# Patient Record
Sex: Female | Born: 1940 | Race: White | Hispanic: No | Marital: Married | Smoking: Former smoker
Health system: Southern US, Community
[De-identification: ages and names within clinical notes are randomized; demographics above are authoritative.]

## PROBLEM LIST (undated history)

## (undated) DIAGNOSIS — Z9289 Personal history of other medical treatment: Secondary | ICD-10-CM

## (undated) DIAGNOSIS — M1990 Unspecified osteoarthritis, unspecified site: Secondary | ICD-10-CM

## (undated) DIAGNOSIS — I82409 Acute embolism and thrombosis of unspecified deep veins of unspecified lower extremity: Secondary | ICD-10-CM

## (undated) DIAGNOSIS — J189 Pneumonia, unspecified organism: Secondary | ICD-10-CM

## (undated) DIAGNOSIS — I509 Heart failure, unspecified: Secondary | ICD-10-CM

## (undated) DIAGNOSIS — Z87442 Personal history of urinary calculi: Secondary | ICD-10-CM

## (undated) DIAGNOSIS — I10 Essential (primary) hypertension: Secondary | ICD-10-CM

## (undated) DIAGNOSIS — G8929 Other chronic pain: Secondary | ICD-10-CM

## (undated) DIAGNOSIS — I4891 Unspecified atrial fibrillation: Secondary | ICD-10-CM

## (undated) DIAGNOSIS — K5900 Constipation, unspecified: Secondary | ICD-10-CM

## (undated) DIAGNOSIS — G4733 Obstructive sleep apnea (adult) (pediatric): Secondary | ICD-10-CM

## (undated) DIAGNOSIS — M545 Low back pain, unspecified: Secondary | ICD-10-CM

## (undated) DIAGNOSIS — R600 Localized edema: Secondary | ICD-10-CM

## (undated) DIAGNOSIS — I4819 Other persistent atrial fibrillation: Secondary | ICD-10-CM

## (undated) DIAGNOSIS — E119 Type 2 diabetes mellitus without complications: Secondary | ICD-10-CM

## (undated) DIAGNOSIS — M2550 Pain in unspecified joint: Secondary | ICD-10-CM

## (undated) DIAGNOSIS — J449 Chronic obstructive pulmonary disease, unspecified: Secondary | ICD-10-CM

## (undated) DIAGNOSIS — E785 Hyperlipidemia, unspecified: Secondary | ICD-10-CM

## (undated) DIAGNOSIS — R0600 Dyspnea, unspecified: Secondary | ICD-10-CM

## (undated) DIAGNOSIS — Z9989 Dependence on other enabling machines and devices: Secondary | ICD-10-CM

## (undated) HISTORY — DX: Hyperlipidemia, unspecified: E78.5

## (undated) HISTORY — DX: Localized edema: R60.0

## (undated) HISTORY — DX: Unspecified atrial fibrillation: I48.91

## (undated) HISTORY — PX: CATARACT EXTRACTION W/ INTRAOCULAR LENS  IMPLANT, BILATERAL: SHX1307

## (undated) HISTORY — PX: TUBAL LIGATION: SHX77

## (undated) HISTORY — DX: Pain in unspecified joint: M25.50

## (undated) HISTORY — DX: Essential (primary) hypertension: I10

## (undated) HISTORY — DX: Dyspnea, unspecified: R06.00

## (undated) HISTORY — PX: CARDIAC CATHETERIZATION: SHX172

## (undated) HISTORY — PX: GANGLION CYST EXCISION: SHX1691

## (undated) HISTORY — PX: COLONOSCOPY: SHX174

## (undated) HISTORY — DX: Chronic obstructive pulmonary disease, unspecified: J44.9

## (undated) HISTORY — PX: CYSTECTOMY: SUR359

## (undated) HISTORY — DX: Constipation, unspecified: K59.00

## (undated) HISTORY — PX: BACK SURGERY: SHX140

## (undated) HISTORY — PX: CARPAL TUNNEL RELEASE: SHX101

## (undated) HISTORY — PX: KNEE CARTILAGE SURGERY: SHX688

---

## 1988-07-17 DIAGNOSIS — Z9289 Personal history of other medical treatment: Secondary | ICD-10-CM

## 1988-07-17 HISTORY — PX: COLON SURGERY: SHX602

## 1988-07-17 HISTORY — PX: LUMBAR DISC SURGERY: SHX700

## 1988-07-17 HISTORY — DX: Personal history of other medical treatment: Z92.89

## 1988-07-17 HISTORY — PX: REPAIR ILIAC ARTERY: SHX6216

## 2001-11-19 ENCOUNTER — Other Ambulatory Visit: Admission: RE | Admit: 2001-11-19 | Discharge: 2001-11-19 | Payer: Self-pay | Admitting: Family Medicine

## 2001-11-20 LAB — HM COLONOSCOPY

## 2003-01-07 ENCOUNTER — Encounter: Admission: RE | Admit: 2003-01-07 | Discharge: 2003-04-07 | Payer: Self-pay | Admitting: Family Medicine

## 2003-05-13 ENCOUNTER — Other Ambulatory Visit: Admission: RE | Admit: 2003-05-13 | Discharge: 2003-05-13 | Payer: Self-pay | Admitting: Obstetrics and Gynecology

## 2004-07-14 ENCOUNTER — Ambulatory Visit: Payer: Self-pay | Admitting: Family Medicine

## 2004-07-15 ENCOUNTER — Ambulatory Visit: Payer: Self-pay | Admitting: Family Medicine

## 2004-07-26 ENCOUNTER — Ambulatory Visit: Payer: Self-pay | Admitting: Family Medicine

## 2004-08-29 ENCOUNTER — Ambulatory Visit: Payer: Self-pay | Admitting: Family Medicine

## 2004-11-03 ENCOUNTER — Ambulatory Visit: Payer: Self-pay | Admitting: Family Medicine

## 2004-12-30 ENCOUNTER — Ambulatory Visit: Payer: Self-pay | Admitting: Family Medicine

## 2005-02-14 ENCOUNTER — Ambulatory Visit: Payer: Self-pay | Admitting: Family Medicine

## 2005-03-27 ENCOUNTER — Ambulatory Visit: Payer: Self-pay | Admitting: Family Medicine

## 2005-05-19 ENCOUNTER — Ambulatory Visit: Payer: Self-pay | Admitting: Family Medicine

## 2005-06-02 ENCOUNTER — Ambulatory Visit (HOSPITAL_COMMUNITY): Admission: RE | Admit: 2005-06-02 | Discharge: 2005-06-02 | Payer: Self-pay | Admitting: Orthopedic Surgery

## 2005-06-02 ENCOUNTER — Ambulatory Visit (HOSPITAL_BASED_OUTPATIENT_CLINIC_OR_DEPARTMENT_OTHER): Admission: RE | Admit: 2005-06-02 | Discharge: 2005-06-02 | Payer: Self-pay | Admitting: Orthopedic Surgery

## 2005-09-04 ENCOUNTER — Ambulatory Visit: Payer: Self-pay | Admitting: Family Medicine

## 2005-11-02 ENCOUNTER — Encounter: Payer: Self-pay | Admitting: Vascular Surgery

## 2005-11-02 ENCOUNTER — Ambulatory Visit (HOSPITAL_COMMUNITY): Admission: RE | Admit: 2005-11-02 | Discharge: 2005-11-02 | Payer: Self-pay | Admitting: Family Medicine

## 2005-11-12 ENCOUNTER — Ambulatory Visit: Payer: Self-pay | Admitting: *Deleted

## 2005-11-12 ENCOUNTER — Ambulatory Visit: Payer: Self-pay | Admitting: Emergency Medicine

## 2005-11-12 ENCOUNTER — Inpatient Hospital Stay (HOSPITAL_COMMUNITY): Admission: EM | Admit: 2005-11-12 | Discharge: 2005-11-17 | Payer: Self-pay | Admitting: Emergency Medicine

## 2005-11-16 ENCOUNTER — Encounter: Payer: Self-pay | Admitting: Cardiology

## 2005-11-24 ENCOUNTER — Ambulatory Visit: Payer: Self-pay | Admitting: Pulmonary Disease

## 2005-11-28 ENCOUNTER — Ambulatory Visit: Payer: Self-pay | Admitting: Family Medicine

## 2005-12-08 ENCOUNTER — Ambulatory Visit: Payer: Self-pay | Admitting: Cardiology

## 2005-12-12 ENCOUNTER — Ambulatory Visit: Payer: Self-pay | Admitting: Emergency Medicine

## 2005-12-18 ENCOUNTER — Ambulatory Visit: Payer: Self-pay | Admitting: Family Medicine

## 2006-01-09 ENCOUNTER — Ambulatory Visit (HOSPITAL_BASED_OUTPATIENT_CLINIC_OR_DEPARTMENT_OTHER): Admission: RE | Admit: 2006-01-09 | Discharge: 2006-01-09 | Payer: Self-pay | Admitting: Emergency Medicine

## 2006-01-18 ENCOUNTER — Ambulatory Visit: Payer: Self-pay | Admitting: Emergency Medicine

## 2006-02-02 ENCOUNTER — Ambulatory Visit: Payer: Self-pay | Admitting: Emergency Medicine

## 2006-02-05 ENCOUNTER — Ambulatory Visit: Payer: Self-pay | Admitting: Cardiology

## 2006-02-09 ENCOUNTER — Ambulatory Visit: Payer: Self-pay | Admitting: Pulmonary Disease

## 2006-04-17 ENCOUNTER — Ambulatory Visit: Payer: Self-pay | Admitting: Family Medicine

## 2006-05-21 ENCOUNTER — Ambulatory Visit: Payer: Self-pay | Admitting: Family Medicine

## 2006-07-12 ENCOUNTER — Ambulatory Visit: Payer: Self-pay | Admitting: Critical Care Medicine

## 2006-08-09 ENCOUNTER — Ambulatory Visit: Payer: Self-pay | Admitting: Emergency Medicine

## 2006-08-09 LAB — CONVERTED CEMR LAB
Chloride: 99 meq/L (ref 96–112)
GFR calc non Af Amer: 90 mL/min
Sodium: 141 meq/L (ref 135–145)

## 2006-08-14 ENCOUNTER — Ambulatory Visit: Payer: Self-pay | Admitting: Cardiology

## 2006-10-02 ENCOUNTER — Ambulatory Visit: Payer: Self-pay | Admitting: Family Medicine

## 2006-10-15 ENCOUNTER — Ambulatory Visit: Payer: Self-pay | Admitting: Family Medicine

## 2006-10-15 LAB — CONVERTED CEMR LAB
Alkaline Phosphatase: 81 units/L (ref 39–117)
BUN: 13 mg/dL (ref 6–23)
Bilirubin, Direct: 0.1 mg/dL (ref 0.0–0.3)
Glucose, Bld: 224 mg/dL — ABNORMAL HIGH (ref 70–99)
Hgb A1c MFr Bld: 9.3 % — ABNORMAL HIGH (ref 4.6–6.0)
Sodium: 139 meq/L (ref 135–145)
Total Bilirubin: 0.7 mg/dL (ref 0.3–1.2)
Total Protein: 6.7 g/dL (ref 6.0–8.3)

## 2006-10-16 ENCOUNTER — Encounter: Payer: Self-pay | Admitting: Family Medicine

## 2006-12-06 DIAGNOSIS — I10 Essential (primary) hypertension: Secondary | ICD-10-CM | POA: Insufficient documentation

## 2006-12-06 DIAGNOSIS — J449 Chronic obstructive pulmonary disease, unspecified: Secondary | ICD-10-CM | POA: Insufficient documentation

## 2006-12-06 DIAGNOSIS — G4733 Obstructive sleep apnea (adult) (pediatric): Secondary | ICD-10-CM | POA: Insufficient documentation

## 2007-03-20 ENCOUNTER — Telehealth: Payer: Self-pay | Admitting: Family Medicine

## 2007-03-20 DIAGNOSIS — E1159 Type 2 diabetes mellitus with other circulatory complications: Secondary | ICD-10-CM | POA: Insufficient documentation

## 2007-04-11 ENCOUNTER — Ambulatory Visit: Payer: Self-pay | Admitting: Family Medicine

## 2007-04-19 ENCOUNTER — Telehealth (INDEPENDENT_AMBULATORY_CARE_PROVIDER_SITE_OTHER): Payer: Self-pay | Admitting: *Deleted

## 2007-04-19 LAB — CONVERTED CEMR LAB
ALT: 44 units/L — ABNORMAL HIGH (ref 0–35)
Albumin: 3.6 g/dL (ref 3.5–5.2)
BUN: 17 mg/dL (ref 6–23)
CO2: 31 meq/L (ref 19–32)
Creatinine, Ser: 0.8 mg/dL (ref 0.4–1.2)
Creatinine,U: 183.8 mg/dL
GFR calc Af Amer: 93 mL/min
GFR calc non Af Amer: 77 mL/min
Glucose, Bld: 202 mg/dL — ABNORMAL HIGH (ref 70–99)
Total CHOL/HDL Ratio: 4
Total Protein: 7.5 g/dL (ref 6.0–8.3)
Triglycerides: 223 mg/dL (ref 0–149)
VLDL: 45 mg/dL — ABNORMAL HIGH (ref 0–40)

## 2007-04-29 ENCOUNTER — Telehealth (INDEPENDENT_AMBULATORY_CARE_PROVIDER_SITE_OTHER): Payer: Self-pay | Admitting: *Deleted

## 2007-05-14 DIAGNOSIS — J984 Other disorders of lung: Secondary | ICD-10-CM | POA: Insufficient documentation

## 2007-05-14 DIAGNOSIS — I739 Peripheral vascular disease, unspecified: Secondary | ICD-10-CM | POA: Insufficient documentation

## 2007-05-14 DIAGNOSIS — E669 Obesity, unspecified: Secondary | ICD-10-CM | POA: Insufficient documentation

## 2007-07-23 ENCOUNTER — Encounter: Payer: Self-pay | Admitting: Family Medicine

## 2007-09-12 ENCOUNTER — Telehealth (INDEPENDENT_AMBULATORY_CARE_PROVIDER_SITE_OTHER): Payer: Self-pay | Admitting: *Deleted

## 2007-10-02 ENCOUNTER — Telehealth (INDEPENDENT_AMBULATORY_CARE_PROVIDER_SITE_OTHER): Payer: Self-pay | Admitting: *Deleted

## 2007-10-03 ENCOUNTER — Telehealth (INDEPENDENT_AMBULATORY_CARE_PROVIDER_SITE_OTHER): Payer: Self-pay | Admitting: *Deleted

## 2007-10-08 ENCOUNTER — Telehealth (INDEPENDENT_AMBULATORY_CARE_PROVIDER_SITE_OTHER): Payer: Self-pay | Admitting: *Deleted

## 2007-10-09 ENCOUNTER — Telehealth (INDEPENDENT_AMBULATORY_CARE_PROVIDER_SITE_OTHER): Payer: Self-pay | Admitting: *Deleted

## 2007-10-22 ENCOUNTER — Telehealth (INDEPENDENT_AMBULATORY_CARE_PROVIDER_SITE_OTHER): Payer: Self-pay | Admitting: *Deleted

## 2007-10-29 ENCOUNTER — Ambulatory Visit: Payer: Self-pay | Admitting: Family Medicine

## 2007-11-04 ENCOUNTER — Telehealth (INDEPENDENT_AMBULATORY_CARE_PROVIDER_SITE_OTHER): Payer: Self-pay | Admitting: *Deleted

## 2007-11-07 ENCOUNTER — Telehealth (INDEPENDENT_AMBULATORY_CARE_PROVIDER_SITE_OTHER): Payer: Self-pay | Admitting: *Deleted

## 2007-11-07 ENCOUNTER — Encounter (INDEPENDENT_AMBULATORY_CARE_PROVIDER_SITE_OTHER): Payer: Self-pay | Admitting: *Deleted

## 2007-11-07 ENCOUNTER — Ambulatory Visit: Payer: Self-pay | Admitting: Family Medicine

## 2007-11-07 LAB — CONVERTED CEMR LAB
Alkaline Phosphatase: 58 units/L (ref 39–117)
BUN: 15 mg/dL (ref 6–23)
Basophils Absolute: 0 10*3/uL (ref 0.0–0.1)
Basophils Relative: 0 % (ref 0.0–1.0)
Bilirubin, Direct: 0.1 mg/dL (ref 0.0–0.3)
Eosinophils Absolute: 0.1 10*3/uL (ref 0.0–0.7)
Eosinophils Relative: 1.4 % (ref 0.0–5.0)
Hemoglobin: 13.4 g/dL (ref 12.0–15.0)
Hgb A1c MFr Bld: 7 % — ABNORMAL HIGH (ref 4.6–6.0)
MCV: 88.9 fL (ref 78.0–100.0)
Monocytes Absolute: 0.6 10*3/uL (ref 0.1–1.0)
Neutro Abs: 4.7 10*3/uL (ref 1.4–7.7)
Sodium: 141 meq/L (ref 135–145)
Total Bilirubin: 0.6 mg/dL (ref 0.3–1.2)
Total Protein: 6.9 g/dL (ref 6.0–8.3)
WBC: 7.6 10*3/uL (ref 4.5–10.5)

## 2007-12-04 ENCOUNTER — Encounter: Payer: Self-pay | Admitting: Family Medicine

## 2007-12-24 ENCOUNTER — Telehealth (INDEPENDENT_AMBULATORY_CARE_PROVIDER_SITE_OTHER): Payer: Self-pay | Admitting: *Deleted

## 2008-03-30 ENCOUNTER — Ambulatory Visit: Payer: Self-pay | Admitting: Family Medicine

## 2008-04-13 ENCOUNTER — Encounter (INDEPENDENT_AMBULATORY_CARE_PROVIDER_SITE_OTHER): Payer: Self-pay | Admitting: *Deleted

## 2008-04-13 LAB — CONVERTED CEMR LAB
ALT: 52 units/L — ABNORMAL HIGH (ref 0–35)
Albumin: 3.6 g/dL (ref 3.5–5.2)
Alkaline Phosphatase: 50 units/L (ref 39–117)
Direct LDL: 64.5 mg/dL
HDL: 21.3 mg/dL — ABNORMAL LOW (ref >39.0)
Total Bilirubin: 0.6 mg/dL (ref 0.3–1.2)
Total Protein: 7.4 g/dL (ref 6.0–8.3)
VLDL: 42 mg/dL — ABNORMAL HIGH (ref 0–40)

## 2008-04-14 ENCOUNTER — Telehealth (INDEPENDENT_AMBULATORY_CARE_PROVIDER_SITE_OTHER): Payer: Self-pay | Admitting: *Deleted

## 2008-07-30 ENCOUNTER — Ambulatory Visit: Payer: Self-pay | Admitting: Family Medicine

## 2008-07-30 DIAGNOSIS — J441 Chronic obstructive pulmonary disease with (acute) exacerbation: Secondary | ICD-10-CM | POA: Insufficient documentation

## 2008-08-03 ENCOUNTER — Telehealth: Payer: Self-pay | Admitting: Family Medicine

## 2008-08-06 ENCOUNTER — Telehealth: Payer: Self-pay | Admitting: Family Medicine

## 2008-10-26 ENCOUNTER — Telehealth (INDEPENDENT_AMBULATORY_CARE_PROVIDER_SITE_OTHER): Payer: Self-pay | Admitting: *Deleted

## 2008-11-20 ENCOUNTER — Telehealth (INDEPENDENT_AMBULATORY_CARE_PROVIDER_SITE_OTHER): Payer: Self-pay | Admitting: *Deleted

## 2008-12-28 ENCOUNTER — Ambulatory Visit: Payer: Self-pay | Admitting: Family Medicine

## 2009-01-06 LAB — CONVERTED CEMR LAB
AST: 47 units/L — ABNORMAL HIGH (ref 0–37)
CO2: 29 meq/L (ref 19–32)
Chloride: 109 meq/L (ref 96–112)
Cholesterol: 221 mg/dL — ABNORMAL HIGH (ref 0–200)
Creatinine, Ser: 0.7 mg/dL (ref 0.4–1.2)
GFR calc non Af Amer: 88.72 mL/min (ref >60)
Glucose, Bld: 158 mg/dL — ABNORMAL HIGH (ref 70–99)
Microalb Creat Ratio: 21.4 mg/g (ref 0.0–30.0)
Microalb, Ur: 2.7 mg/dL — ABNORMAL HIGH (ref 0.0–1.9)
Total Bilirubin: 0.6 mg/dL (ref 0.3–1.2)
Total Protein: 7.4 g/dL (ref 6.0–8.3)

## 2009-01-07 ENCOUNTER — Encounter (INDEPENDENT_AMBULATORY_CARE_PROVIDER_SITE_OTHER): Payer: Self-pay | Admitting: *Deleted

## 2009-01-11 ENCOUNTER — Ambulatory Visit: Payer: Self-pay | Admitting: Family Medicine

## 2009-01-25 ENCOUNTER — Encounter: Payer: Self-pay | Admitting: Nurse Practitioner

## 2009-01-27 ENCOUNTER — Ambulatory Visit: Payer: Self-pay | Admitting: Family Medicine

## 2009-02-26 ENCOUNTER — Telehealth (INDEPENDENT_AMBULATORY_CARE_PROVIDER_SITE_OTHER): Payer: Self-pay | Admitting: *Deleted

## 2009-03-29 ENCOUNTER — Ambulatory Visit: Payer: Self-pay | Admitting: Family Medicine

## 2009-05-10 ENCOUNTER — Ambulatory Visit: Payer: Self-pay | Admitting: Internal Medicine

## 2009-05-23 ENCOUNTER — Telehealth: Payer: Self-pay | Admitting: Internal Medicine

## 2009-05-24 ENCOUNTER — Ambulatory Visit: Payer: Self-pay | Admitting: Family Medicine

## 2009-06-16 ENCOUNTER — Telehealth (INDEPENDENT_AMBULATORY_CARE_PROVIDER_SITE_OTHER): Payer: Self-pay | Admitting: *Deleted

## 2009-06-17 ENCOUNTER — Encounter: Payer: Self-pay | Admitting: Family Medicine

## 2009-06-21 ENCOUNTER — Ambulatory Visit: Payer: Self-pay | Admitting: Family Medicine

## 2009-06-22 ENCOUNTER — Telehealth: Payer: Self-pay | Admitting: Family Medicine

## 2009-06-22 LAB — CONVERTED CEMR LAB
Calcium: 8.8 mg/dL (ref 8.4–10.5)
Chloride: 101 meq/L (ref 96–112)
Glucose, Bld: 150 mg/dL — ABNORMAL HIGH (ref 70–99)
HDL: 26.2 mg/dL — ABNORMAL LOW (ref >39.00)
Hgb A1c MFr Bld: 6.9 % — ABNORMAL HIGH (ref 4.6–6.5)
Sodium: 140 meq/L (ref 135–145)
Total CHOL/HDL Ratio: 6
VLDL: 37.8 mg/dL (ref 0.0–40.0)

## 2009-06-23 ENCOUNTER — Encounter (INDEPENDENT_AMBULATORY_CARE_PROVIDER_SITE_OTHER): Payer: Self-pay | Admitting: *Deleted

## 2009-07-26 ENCOUNTER — Telehealth (INDEPENDENT_AMBULATORY_CARE_PROVIDER_SITE_OTHER): Payer: Self-pay | Admitting: *Deleted

## 2009-08-07 ENCOUNTER — Ambulatory Visit: Payer: Self-pay | Admitting: Family Medicine

## 2009-08-19 ENCOUNTER — Telehealth (INDEPENDENT_AMBULATORY_CARE_PROVIDER_SITE_OTHER): Payer: Self-pay | Admitting: *Deleted

## 2009-08-30 ENCOUNTER — Ambulatory Visit: Payer: Self-pay | Admitting: Internal Medicine

## 2009-08-30 ENCOUNTER — Telehealth: Payer: Self-pay | Admitting: Family Medicine

## 2009-09-01 ENCOUNTER — Ambulatory Visit: Payer: Self-pay | Admitting: Internal Medicine

## 2009-09-14 ENCOUNTER — Telehealth: Payer: Self-pay | Admitting: Internal Medicine

## 2009-10-07 ENCOUNTER — Ambulatory Visit: Payer: Self-pay | Admitting: Family Medicine

## 2009-11-15 ENCOUNTER — Ambulatory Visit: Payer: Self-pay | Admitting: Family Medicine

## 2009-11-16 ENCOUNTER — Telehealth (INDEPENDENT_AMBULATORY_CARE_PROVIDER_SITE_OTHER): Payer: Self-pay | Admitting: *Deleted

## 2009-11-16 LAB — CONVERTED CEMR LAB
ALT: 38 units/L — ABNORMAL HIGH (ref 0–35)
CO2: 32 meq/L (ref 19–32)
Chloride: 101 meq/L (ref 96–112)
Cholesterol: 139 mg/dL (ref 0–200)
Creatinine, Ser: 0.6 mg/dL (ref 0.4–1.2)
Creatinine,U: 84.8 mg/dL
Direct LDL: 83.8 mg/dL
Glucose, Bld: 248 mg/dL — ABNORMAL HIGH (ref 70–99)
HDL: 30.9 mg/dL — ABNORMAL LOW (ref >39.00)
Hgb A1c MFr Bld: 8.9 % — ABNORMAL HIGH (ref 4.6–6.5)
Microalb, Ur: 7.9 mg/dL — ABNORMAL HIGH (ref 0.0–1.9)
Potassium: 4.2 meq/L (ref 3.5–5.1)
Sodium: 140 meq/L (ref 135–145)
Total CHOL/HDL Ratio: 4

## 2009-11-18 ENCOUNTER — Ambulatory Visit: Payer: Self-pay | Admitting: Family Medicine

## 2009-11-18 DIAGNOSIS — IMO0001 Reserved for inherently not codable concepts without codable children: Secondary | ICD-10-CM | POA: Insufficient documentation

## 2009-11-18 DIAGNOSIS — E785 Hyperlipidemia, unspecified: Secondary | ICD-10-CM | POA: Insufficient documentation

## 2010-04-07 ENCOUNTER — Telehealth (INDEPENDENT_AMBULATORY_CARE_PROVIDER_SITE_OTHER): Payer: Self-pay | Admitting: *Deleted

## 2010-04-12 ENCOUNTER — Ambulatory Visit: Payer: Self-pay | Admitting: Family Medicine

## 2010-04-18 ENCOUNTER — Ambulatory Visit: Payer: Self-pay | Admitting: Family Medicine

## 2010-04-18 DIAGNOSIS — J019 Acute sinusitis, unspecified: Secondary | ICD-10-CM | POA: Insufficient documentation

## 2010-04-25 ENCOUNTER — Encounter: Payer: Self-pay | Admitting: Family Medicine

## 2010-07-25 ENCOUNTER — Encounter: Payer: Self-pay | Admitting: Family Medicine

## 2010-08-03 ENCOUNTER — Telehealth (INDEPENDENT_AMBULATORY_CARE_PROVIDER_SITE_OTHER): Payer: Self-pay | Admitting: *Deleted

## 2010-08-04 ENCOUNTER — Other Ambulatory Visit: Payer: Self-pay | Admitting: Family Medicine

## 2010-08-04 ENCOUNTER — Ambulatory Visit
Admission: RE | Admit: 2010-08-04 | Discharge: 2010-08-04 | Payer: Self-pay | Source: Home / Self Care | Attending: Family Medicine | Admitting: Family Medicine

## 2010-08-04 LAB — LIPID PANEL
Cholesterol: 115 mg/dL (ref 0–200)
HDL: 27.7 mg/dL — ABNORMAL LOW (ref >39.00)
Total CHOL/HDL Ratio: 4
Triglycerides: 217 mg/dL — ABNORMAL HIGH (ref 0.0–149.0)
VLDL: 43.4 mg/dL — ABNORMAL HIGH (ref 0.0–40.0)

## 2010-08-04 LAB — HEPATIC FUNCTION PANEL
ALT: 30 U/L (ref 0–35)
AST: 31 U/L (ref 0–37)
Albumin: 3.7 g/dL (ref 3.5–5.2)
Alkaline Phosphatase: 57 U/L (ref 39–117)
Bilirubin, Direct: 0.1 mg/dL (ref 0.0–0.3)
Total Bilirubin: 0.6 mg/dL (ref 0.3–1.2)
Total Protein: 7.1 g/dL (ref 6.0–8.3)

## 2010-08-04 LAB — BASIC METABOLIC PANEL
BUN: 15 mg/dL (ref 6–23)
CO2: 29 mEq/L (ref 19–32)
Calcium: 8.6 mg/dL (ref 8.4–10.5)
Chloride: 98 mEq/L (ref 96–112)
Creatinine, Ser: 0.6 mg/dL (ref 0.4–1.2)
GFR: 116.63 mL/min (ref >60.00)
Glucose, Bld: 171 mg/dL — ABNORMAL HIGH (ref 70–99)
Potassium: 3.9 mEq/L (ref 3.5–5.1)
Sodium: 135 mEq/L (ref 135–145)

## 2010-08-04 LAB — MICROALBUMIN / CREATININE URINE RATIO
Creatinine,U: 205.3 mg/dL
Microalb Creat Ratio: 1.3 mg/g (ref 0.0–30.0)
Microalb, Ur: 2.6 mg/dL — ABNORMAL HIGH (ref 0.0–1.9)

## 2010-08-04 LAB — CBC WITH DIFFERENTIAL/PLATELET
Basophils Absolute: 0 10*3/uL (ref 0.0–0.1)
Basophils Relative: 0.4 % (ref 0.0–3.0)
Eosinophils Absolute: 0.2 10*3/uL (ref 0.0–0.7)
Eosinophils Relative: 2.1 % (ref 0.0–5.0)
HCT: 45.3 % (ref 36.0–46.0)
Hemoglobin: 15 g/dL (ref 12.0–15.0)
Lymphocytes Relative: 34 % (ref 12.0–46.0)
Lymphs Abs: 2.6 10*3/uL (ref 0.7–4.0)
MCHC: 33 g/dL (ref 30.0–36.0)
MCV: 90.7 fl (ref 78.0–100.0)
Monocytes Absolute: 0.7 10*3/uL (ref 0.1–1.0)
Monocytes Relative: 9.1 % (ref 3.0–12.0)
Neutro Abs: 4.1 10*3/uL (ref 1.4–7.7)
Neutrophils Relative %: 54.4 % (ref 43.0–77.0)
Platelets: 243 10*3/uL (ref 150.0–400.0)
RBC: 5 Mil/uL (ref 3.87–5.11)
RDW: 15.1 % — ABNORMAL HIGH (ref 11.5–14.6)
WBC: 7.5 10*3/uL (ref 4.5–10.5)

## 2010-08-04 LAB — LDL CHOLESTEROL, DIRECT: Direct LDL: 63.6 mg/dL

## 2010-08-04 LAB — HEMOGLOBIN A1C: Hgb A1c MFr Bld: 7.8 % — ABNORMAL HIGH (ref 4.6–6.5)

## 2010-08-14 LAB — CONVERTED CEMR LAB
ALT: 37 units/L — ABNORMAL HIGH (ref 0–35)
Albumin: 4 g/dL (ref 3.5–5.2)
Alkaline Phosphatase: 61 units/L (ref 39–117)
Calcium: 8.8 mg/dL (ref 8.4–10.5)
Creatinine, Ser: 0.7 mg/dL (ref 0.4–1.2)
Microalb Creat Ratio: 14.3 mg/g (ref 0.0–30.0)
Potassium: 4.4 meq/L (ref 3.5–5.1)
Sodium: 140 meq/L (ref 135–145)
Total Bilirubin: 0.5 mg/dL (ref 0.3–1.2)
Total Protein: 7.5 g/dL (ref 6.0–8.3)

## 2010-08-15 ENCOUNTER — Encounter: Payer: Self-pay | Admitting: Family Medicine

## 2010-08-15 ENCOUNTER — Ambulatory Visit
Admission: RE | Admit: 2010-08-15 | Discharge: 2010-08-15 | Payer: Self-pay | Source: Home / Self Care | Attending: Family Medicine | Admitting: Family Medicine

## 2010-08-15 DIAGNOSIS — I739 Peripheral vascular disease, unspecified: Secondary | ICD-10-CM | POA: Insufficient documentation

## 2010-08-16 DIAGNOSIS — D126 Benign neoplasm of colon, unspecified: Secondary | ICD-10-CM | POA: Insufficient documentation

## 2010-08-17 ENCOUNTER — Encounter (INDEPENDENT_AMBULATORY_CARE_PROVIDER_SITE_OTHER): Payer: Self-pay | Admitting: *Deleted

## 2010-08-18 NOTE — Progress Notes (Signed)
Summary: refill  Phone Note Refill Request Message from:  Fax from Pharmacy [facility] [address] fax [phone]  Refills Requested: Medication #1:  FENOFIBRATE 160 MG TABS 1 by mouth once daily Initial call taken by: Barb Merino,  July 26, 2009 9:33 AM    Prescriptions: FENOFIBRATE 160 MG TABS (FENOFIBRATE) 1 by mouth once daily  #30 Tablet x 1   Entered by:   Army Fossa CMA   Authorized by:   Loreen Freud DO   Signed by:   Army Fossa CMA on 07/26/2009   Method used:   Electronically to        [facility] [phone]* (retail)       [address] [place], [place]  [address]       Ph: [phone]       Fax: [phone]   RxID:   [id number]

## 2010-08-18 NOTE — Letter (Signed)
Summary: [facility]   Imported By: Lanelle Bal 05/18/2010 13:01:12  _____________________________________________________________________  External Attachment:    Type:   Image     Comment:   External Document

## 2010-08-18 NOTE — Progress Notes (Signed)
Summary: refill  Phone Note Refill Request Message from:  Fax from Pharmacy [facility] [address] fax [phone]  Refills Requested: Medication #1:  KLOR-CON 10 10 MEQ CR-TABS 1 by mouth two times a day Initial call taken by: Barb Merino,  August 19, 2009 9:27 AM    Prescriptions: KLOR-CON 10 10 MEQ CR-TABS (POTASSIUM CHLORIDE) 1 by mouth two times a day  #60 x 1   Entered by:   Army Fossa CMA   Authorized by:   Loreen Freud DO   Signed by:   Army Fossa CMA on 08/19/2009   Method used:   Electronically to        [facility] [phone]* (retail)       [address] [place], [place]  [address]       Ph: [phone]       Fax: [phone]   RxID:   [id number]

## 2010-08-18 NOTE — Progress Notes (Signed)
Summary: LAB BEFORE OFFICE VISIT??  Phone Note Call from Patient Call back at [facility] Phone [phone]   Caller: Patient Summary of Call: DOES PATIENT NEED OFFICE VISIT WITH LABS   OR CAN SHE COME FOR LABS FIRST , THEN HAVE OFFICE VISIT A WEEK LATER??  PLEASE LET ME KNOW WHAT ORDERS AND DIAGNOSIS CODE TO ENTER FOR LAB VISIT IF IT WILL BE SEPARATE--  per note  = hep 272.4 Initial call taken by: Jerolyn Shin,  April 07, 2010 4:55 PM  Follow-up for Phone Call        last lab actually said   NMR and hep272.4  but also do 250.00  bmp, hgba1c, microalbumin Follow-up by: Loreen Freud DO,  April 08, 2010 3:51 PM  Additional Follow-up for Phone Call Additional follow up Details #1::        ADDITIONAL LABS ADDED FOR TOTAL OF FIVE LABS Additional Follow-up by: Jerolyn Shin,  April 08, 2010 5:24 PM

## 2010-08-18 NOTE — Progress Notes (Signed)
Summary: Antibiodic Change  Phone Note Call from Patient Call back at Home Phone [phone]   Caller: Patient Reason for Call: Refill Medication, Talk to Nurse, Talk to Doctor Summary of Call: When patient was in last she was given an antiboitic for bronchitis and she says it is not working. It imporved the symptoms but has not eliminted it. She was wondering if something else could be called in or if she needs to come back in to see Dr. Laury Axon. [facility] [address]  Also please change her pharmacy in her chart to the [facility] [address]. It is NOT [facility].   Thanks, Initial call taken by: Harold Barban,  August 30, 2009 9:15 AM  Follow-up for Phone Call        its been > 2 weeks so pt should be seen again.   Follow-up by: Loreen Freud DO,  August 30, 2009 9:52 AM  Additional Follow-up for Phone Call Additional follow up Details #1::        Pt has appt with dr Drue Novel today. Army Fossa CMA  August 30, 2009 11:40 AM

## 2010-08-18 NOTE — Progress Notes (Signed)
Summary: question regarding labs and dr appt--both sched  Phone Note Call from Patient Call back at Home Phone [phone]   Caller: Patient Summary of Call: patient called to make appt for labs followed by appt with Dr Sabino Donovan says she always gets labs first, then sees doctor so she only has one copay---she also says that her appt is just a regular appt, not a physical---says she has never had a physical with Dr Laury Axon  1) need orders and diag codes for lab 2) ok to schedule a "RO4" to discuss labs instead of a CPX ?? Initial call taken by: Jerolyn Shin,  August 03, 2010 9:14 AM  Follow-up for Phone Call        Pls advise.........Marland KitchenFelecia Deloach CMA  August 03, 2010 9:30 AM   Additional Follow-up for Phone Call Additional follow up Details #1::        she needs a cpe---so we can do ekg, immun update etc. It looks like she is overdue to see Dr Talmage Nap as well  250.00  272.4  401.9  bmp, hgba1c, lipid, hep,CBCD,  microalbumin,  Additional Follow-up by: Loreen Freud DO,  August 03, 2010 11:28 AM    Additional Follow-up for Phone Call Additional follow up Details #2::    labs were sched for 1/19, patient is scheduled for Dr Laury Axon for 1/30 at 10;00 Follow-up by: Jerolyn Shin,  August 03, 2010 12:05 PM

## 2010-08-18 NOTE — Assessment & Plan Note (Signed)
Summary: congested/cbs   Vital Signs:  Patient profile:   70 year old female Weight:      261.8 pounds O2 Sat:      94 % on Room air Temp:     99.0 degrees F oral Pulse rate:   72 / minute Pulse rhythm:   regular BP sitting:   132 / 78  (right arm) Cuff size:   large  Vitals Entered By: Almeta Monas CMA Duncan Dull) (April 18, 2010 10:23 AM)  O2 Flow:  Room air CC: c/o cough and congestion, URI symptoms   History of Present Illness:       This is a 70 year old woman who presents with URI symptoms.  The symptoms began 5 days ago.  Pt here c/o congestion since Thursday.  Her grandson was sick.  The patient complains of nasal congestion, purulent nasal discharge, productive cough, and sick contacts, but denies sore throat and dry cough.  Associated symptoms include fever and wheezing.  The patient denies low-grade fever (<100.5 degrees), fever of 100.5-103 degrees, fever of 103.1-104 degrees, fever to >104 degrees, stiff neck, dyspnea, rash, vomiting, diarrhea, use of an antipyretic, and response to antipyretic.  The patient denies itchy watery eyes, itchy throat, sneezing, seasonal symptoms, response to antihistamine, headache, muscle aches, and severe fatigue.  The patient denies the following risk factors for Strep sinusitis: unilateral facial pain, unilateral nasal discharge, poor response to decongestant, double sickening, tooth pain, Strep exposure, tender adenopathy, and absence of cough.    Current Medications (verified): 1)  Symbicort 160-4.5 Mcg/act Aero (Budesonide-Formoterol Fumarate) .... 2 Puffs Two Times A Day 2)  Albuterol Sulfate (2.5 Mg/59ml) 0.083% Nebu (Albuterol Sulfate) .Marland Kitchen.. 1 Via Neb Qid As Needed 3)  Amaryl 4 Mg  Tabs (Glimepiride) .... Take 1 Tab Two Times A Day 4)  Metformin Hcl 500 Mg Tabs (Metformin Hcl) .... 2 By Mouth Two Times A Day 5)  Crestor 20 Mg Tabs (Rosuvastatin Calcium) .Marland Kitchen.. 1 By Mouth At Bedtime 6)  Fenofibrate 160 Mg Tabs (Fenofibrate) .Marland Kitchen.. 1 By Mouth  Once Daily 7)  Metoprolol Tartrate 25 Mg Tabs (Metoprolol Tartrate) .... Take 1/2 Tab Two Times A Day 8)  Lisinopril 5 Mg Tabs (Lisinopril) .Marland Kitchen.. 1 By Mouth Once Daily 9)  Furosemide 20 Mg Tabs (Furosemide) .... Take 1 Tablet By Mouth Two Times A Day 10)  Klor-Con 10 10 Meq Cr-Tabs (Potassium Chloride) .Marland Kitchen.. 1 By Mouth Two Times A Day 11)  Adult Aspirin Low Strength 81 Mg  Tbdp (Aspirin) .... Take 1 Tablet By Mouth Once A Day 12)  Cpap 13)  Onetouch Ultra Test   Strp (Glucose Blood) .... Use As Directed Once Daily.  Patient Will Need Ov Before Future Refills. 14)  Zostavax 32440 Unt/0.61ml Solr (Zoster Vaccine Live) .Marland Kitchen.. 1 Ml  Im X1 15)  Augmentin 875-125 Mg Tabs (Amoxicillin-Pot Clavulanate) .Marland Kitchen.. 1 By Mouth Two Times A Day  Allergies (verified): 1)  ! Sulfa 2)  ! Neosporin  Past History:  Past medical, surgical, family and social histories (including risk factors) reviewed for relevance to current acute and chronic problems.  Past Medical History: Reviewed history from 11/18/2009 and no changes required. COPD, s/p intubation 2007 aprox d/t resp failure  Hypertension Diabetes mellitus, type II Hyperlipidemia  Family History: Reviewed history from 12/06/2006 and no changes required. Family History Diabetes 1st degree relative Family History of Melanoma  Social History: Reviewed history from 08/30/2009 and no changes required. tobacco -- heavy smoker quit 2007  Review  of Systems      See HPI  Physical Exam  General:  Well-developed,well-nourished,in no acute distress; alert,appropriate and cooperative throughout examination Ears:  External ear exam shows no significant lesions or deformities.  Otoscopic examination reveals clear canals, tympanic membranes are intact bilaterally without bulging, retraction, inflammation or discharge. Hearing is grossly normal bilaterally. Nose:  L frontal sinus tenderness, L maxillary sinus tenderness, R frontal sinus tenderness, and R maxillary  sinus tenderness.   Mouth:  Oral mucosa and oropharynx without lesions or exudates.  Teeth in good repair. Neck:  No deformities, masses, or tenderness noted. Lungs:  normal respiratory effort, no intercostal retractions, R wheezes, and L wheezes.   Heart:  normal rate.   Skin:  Intact without suspicious lesions or rashes Psych:  Cognition and judgment appear intact. Alert and cooperative with normal attention span and concentration. No apparent delusions, illusions, hallucinations   Impression & Recommendations:  Problem # 1:  BRONCHITIS- ACUTE (ICD-466.0)  Her updated medication list for this problem includes:    Symbicort 160-4.5 Mcg/act Aero (Budesonide-formoterol fumarate) .Marland Kitchen... 2 puffs two times a day    Albuterol Sulfate (2.5 Mg/37ml) 0.083% Nebu (Albuterol sulfate) .Marland Kitchen... 1 via neb qid as needed    Augmentin 875-125 Mg Tabs (Amoxicillin-pot clavulanate) .Marland Kitchen... 1 by mouth two times a day  Take antibiotics and other medications as directed. Encouraged to push clear liquids, get enough rest, and take acetaminophen as needed. To be seen in 5-7 days if no improvement, sooner if worse.  Orders: Prescription Created Electronically [phone])  Problem # 2:  SINUSITIS - ACUTE-NOS (ICD-461.9)  Her updated medication list for this problem includes:    Augmentin 875-125 Mg Tabs (Amoxicillin-pot clavulanate) .Marland Kitchen... 1 by mouth two times a day  Instructed on treatment. Call if symptoms persist or worsen.   Complete Medication List: 1)  Symbicort 160-4.5 Mcg/act Aero (Budesonide-formoterol fumarate) .... 2 puffs two times a day 2)  Albuterol Sulfate (2.5 Mg/60ml) 0.083% Nebu (Albuterol sulfate) .Marland Kitchen.. 1 via neb qid as needed 3)  Amaryl 4 Mg Tabs (Glimepiride) .... Take 1 tab two times a day 4)  Metformin Hcl 500 Mg Tabs (Metformin hcl) .... 2 by mouth two times a day 5)  Crestor 20 Mg Tabs (Rosuvastatin calcium) .Marland Kitchen.. 1 by mouth at bedtime 6)  Fenofibrate 160 Mg Tabs (Fenofibrate) .Marland Kitchen.. 1 by mouth  once daily 7)  Metoprolol Tartrate 25 Mg Tabs (Metoprolol tartrate) .... Take 1/2 tab two times a day 8)  Lisinopril 5 Mg Tabs (Lisinopril) .Marland Kitchen.. 1 by mouth once daily 9)  Furosemide 20 Mg Tabs (Furosemide) .... Take 1 tablet by mouth two times a day 10)  Klor-con 10 10 Meq Cr-tabs (Potassium chloride) .Marland Kitchen.. 1 by mouth two times a day 11)  Adult Aspirin Low Strength 81 Mg Tbdp (Aspirin) .... Take 1 tablet by mouth once a day 12)  Cpap  13)  Onetouch Ultra Test Strp (Glucose blood) .... Use as directed once daily.  patient will need ov before future refills. 14)  Zostavax 47829 Unt/0.58ml Solr (Zoster vaccine live) .Marland Kitchen.. 1 ml  im x1 15)  Augmentin 875-125 Mg Tabs (Amoxicillin-pot clavulanate) .Marland Kitchen.. 1 by mouth two times a day  Patient Instructions: 1)  Acute Bronchitis symptoms for less then 10 days are not  helped by antibiotics. Take over the counter cough medications. Call if no improvement in 5-7 days, sooner if increasing cough, fever, or new symptoms ( shortness of breath, chest pain) .  Prescriptions: AUGMENTIN 875-125 MG TABS (AMOXICILLIN-POT  CLAVULANATE) 1 by mouth two times a day  #20 x 0   Entered and Authorized by:   Loreen Freud DO   Signed by:   Loreen Freud DO on 04/18/2010   Method used:   [facility] [id number][facility] ([facility]ail[facility]       [address] [place], [place]  [address]       Ph: [phone]       Fax: [phone]   RxID:   [id number]

## 2010-08-18 NOTE — Progress Notes (Signed)
Summary: dulera not covered  Phone Note Other Incoming Call back at [phone]   Caller: fax from [facility] Complete Summary of Call: [facility] not covered, called [employer] - flovent, pulmicort, Qvar are covered Heidi Stark  September 14, 2009 10:28 AM   Follow-up for Phone Call        switch to Qvar. if not doing well needs OV Heidi Scheiber E. Zelma Mazariego MD  September 14, 2009 3:40 PM   discussed with pt -- does not need rx at this time because she did get dulera filled Heidi Stark  September 14, 2009 4:35 PM     New/Updated Medications: QVAR 80 MCG/ACT AERS (BECLOMETHASONE DIPROPIONATE) two puffs  daily

## 2010-08-18 NOTE — Assessment & Plan Note (Signed)
Summary: BRONCHITIS   Vital Signs:  Patient profile:   70 year old female Weight:      268 pounds O2 Sat:      95 % on Room air Temp:     99.5 degrees F oral Pulse rate:   74 / minute Pulse rhythm:   regular BP sitting:   168 / 80  (left arm) Cuff size:   large  Vitals Entered By: Mervin Hack CMA Duncan Dull) (August 07, 2009 10:04 AM)  O2 Flow:  Room air CC: bronchitis?, Cough   History of Present Illness:  Cough      This is a 70 year old woman who presents with Cough.  The symptoms began 1 week ago.  The patient reports productive cough, wheezing, and fever, but denies non-productive cough, pleuritic chest pain, shortness of breath, exertional dyspnea, hemoptysis, and malaise.  Associated symtpoms include nasal congestion.  The patient denies the following symptoms: cold/URI symptoms, sore throat, chronic rhinitis, weight loss, acid reflux symptoms, and peripheral edema.  The cough is worse with activity and lying down.  Ineffective prior treatments have included OTC cough medication and albuterol inhaler.    Allergies: 1)  ! Sulfa 2)  ! Neosporin  Physical Exam  General:  Well-developed,well-nourished,in no acute distress; alert,appropriate and cooperative throughout examination Ears:  External ear exam shows no significant lesions or deformities.  Otoscopic examination reveals clear canals, tympanic membranes are intact bilaterally without bulging, retraction, inflammation or discharge. Hearing is grossly normal bilaterally. Nose:  External nasal examination shows no deformity or inflammation. Nasal mucosa are pink and moist without lesions or exudates. Mouth:  Oral mucosa and oropharynx without lesions or exudates.  Teeth in good repair. Neck:  No deformities, masses, or tenderness noted. Lungs:  R wheezes, L decreased breath sounds, and L wheezes.   Extremities:  No clubbing, cyanosis, edema, or deformity noted with normal full range of motion of all joints.      Impression & Recommendations:  Problem # 1:  BRONCHITIS- ACUTE (ICD-466.0)  Her updated medication list for this problem includes:    Albuterol Sulfate (2.5 Mg/64ml) 0.083% Nebu (Albuterol sulfate) .Marland Kitchen... 1 via neb qid as needed    Symbicort 160-4.5 Mcg/act Aero (Budesonide-formoterol fumarate) .Marland Kitchen... 2 puffs two times a day    Biaxin Xl 500 Mg Xr24h-tab (Clarithromycin) .Marland Kitchen... 2 by mouth once daily  Take antibiotics and other medications as directed. Encouraged to push clear liquids, get enough rest, and take acetaminophen as needed. To be seen in 5-7 days if no improvement, sooner if worse.  Complete Medication List: 1)  Janumet 50-1000 Mg Tabs (Sitagliptin-metformin hcl) .... Take 1 tab two times a day 2)  Metoprolol Tartrate 25 Mg Tabs (Metoprolol tartrate) .... Take 1/2 tab two times a day 3)  Lisinopril 5 Mg Tabs (Lisinopril) .Marland Kitchen.. 1 by mouth once daily 4)  Furosemide 20 Mg Tabs (Furosemide) .... Take 1 tablet by mouth two times a day 5)  Klor-con 10 10 Meq Cr-tabs (Potassium chloride) .Marland Kitchen.. 1 by mouth two times a day 6)  Amaryl 4 Mg Tabs (Glimepiride) .... Take 1 tab two times a day 7)  Adult Aspirin Low Strength 81 Mg Tbdp (Aspirin) .... Take 1 tablet by mouth once a day 8)  Cpap  9)  Onetouch Ultra Test Strp (Glucose blood) .... Use as directed once daily.  patient will need ov before future refills. 10)  Fenofibrate 160 Mg Tabs (Fenofibrate) .Marland Kitchen.. 1 by mouth once daily 11)  Zostavax 16109 Unt/0.28ml  Solr (Zoster vaccine live) .Marland Kitchen.. 1 ml  im x1 12)  Zocor 40 Mg Tabs (Simvastatin) .Marland Kitchen.. 1 by mouth  at bedtime 13)  Albuterol Sulfate (2.5 Mg/73ml) 0.083% Nebu (Albuterol sulfate) .Marland Kitchen.. 1 via neb qid as needed 14)  Symbicort 160-4.5 Mcg/act Aero (Budesonide-formoterol fumarate) .... 2 puffs two times a day 15)  Biaxin Xl 500 Mg Xr24h-tab (Clarithromycin) .... 2 by mouth once daily Prescriptions: BIAXIN XL 500 MG XR24H-TAB (CLARITHROMYCIN) 2 by mouth once daily  #14 x 1   Entered and  Authorized by:   Loreen Freud DO   Signed by:   Loreen Freud DO on 08/07/2009   Method used:   Electronically to        [facility]* (retail)       [address]       [place], [place]  [address]       Ph: [phone] or [phone]       Fax: [phone]   RxID:   [id number]    Current Allergies (reviewed today): ! SULFA ! NEOSPORIN

## 2010-08-18 NOTE — Progress Notes (Signed)
Summary: Lab Results   Phone Note Outgoing Call   Call placed by: Army Fossa CMA,  Nov 16, 2009 9:22 AM Summary of Call: Regarding lab results, LMTCB:  needs ov to discuss labs- Signed by Loreen Freud DO on 11/15/2009 at 9:08 PM   Follow-up for Phone Call        Pt made appt for thursday. Army Fossa CMA  Nov 16, 2009 1:30 PM

## 2010-08-18 NOTE — Assessment & Plan Note (Signed)
Summary: Discuss labs/drb   Vital Signs:  Patient profile:   70 year old female Weight:      271 pounds Pulse rate:   76 / minute Pulse rhythm:   regular BP sitting:   128 / 82  (left arm) Cuff size:   large  Vitals Entered By: Army Fossa CMA (Nov 18, 2009 9:47 AM) CC: Pt here to discuss labs.    Allergies: 1)  ! Sulfa 2)  ! Neosporin  Past History:  Past Medical History: COPD, s/p intubation 2007 aprox d/t resp failure  Hypertension Diabetes mellitus, type II Hyperlipidemia  Physical Exam  General:  Well-developed,well-nourished,in no acute distress; alert,appropriate and cooperative throughout examinationoverweight-appearing.   Lungs:  Normal respiratory effort, chest expands symmetrically. Lungs are clear to auscultation, no crackles or wheezes. Heart:  normal rate and no murmur.   Extremities:  No clubbing, cyanosis, edema, or deformity noted with normal full range of motion of all joints.    Diabetes Management Exam:    Foot Exam (with socks and/or shoes not present):       Sensory-Pinprick/Light touch:          Left medial foot (L-4): normal          Left dorsal foot (L-5): normal          Left lateral foot (S-1): normal          Right medial foot (L-4): normal          Right dorsal foot (L-5): normal          Right lateral foot (S-1): normal       Sensory-Monofilament:          Left foot: normal          Right foot: normal       Inspection:          Left foot: normal          Right foot: normal       Nails:          Left foot: normal          Right foot: normal   Impression & Recommendations:  Problem # 1:  HYPERLIPIDEMIA (ICD-272.4)  Her updated medication list for this problem includes:    Crestor 20 Mg Tabs (Rosuvastatin calcium) .Marland Kitchen... 1 by mouth at bedtime    Fenofibrate 160 Mg Tabs (Fenofibrate) .Marland Kitchen... 1 by mouth once daily  Labs Reviewed: SGOT: 33 (11/15/2009)   SGPT: 38 (11/15/2009)  Prior 10 Yr Risk Heart Disease: Not enough  information (10/29/2007)   HDL:30.90 (11/15/2009), 26.20 (06/21/2009)  LDL:82 (06/21/2009), DEL (04/54/0981)  Chol:139 (11/15/2009), 146 (06/21/2009)  Trig:222.0 (11/15/2009), 189.0 (06/21/2009)  Problem # 2:  MYALGIA (ICD-729.1)  Her updated medication list for this problem includes:    Adult Aspirin Low Strength 81 Mg Tbdp (Aspirin) .Marland Kitchen... Take 1 tablet by mouth once a day  Orders: TLB-CK Total Only(Creatine Kinase/CPK) (82550-CK) T-Vitamin D (25-Hydroxy) (19147-82956) Prescription Created Electronically [phone])  Problem # 3:  DIABETES MELLITUS, TYPE II (ICD-250.00) per endo Her updated medication list for this problem includes:    Amaryl 4 Mg Tabs (Glimepiride) .Marland Kitchen... Take 1 tab two times a day    Metformin Hcl 500 Mg Tabs (Metformin hcl) .Marland Kitchen... 2 by mouth two times a day    Lisinopril 5 Mg Tabs (Lisinopril) .Marland Kitchen... 1 by mouth once daily    Adult Aspirin Low Strength 81 Mg Tbdp (Aspirin) .Marland Kitchen... Take 1 tablet by mouth once a day  Orders: Prescription Created Electronically [phone])  Labs Reviewed: Creat: 0.6 (11/15/2009)    Reviewed HgBA1c results: 8.9 (11/15/2009)  6.9 (06/21/2009)  Problem # 4:  HYPERTENSION (ICD-401.9)  Her updated medication list for this problem includes:    Metoprolol Tartrate 25 Mg Tabs (Metoprolol tartrate) .Marland Kitchen... Take 1/2 tab two times a day    Lisinopril 5 Mg Tabs (Lisinopril) .Marland Kitchen... 1 by mouth once daily    Furosemide 20 Mg Tabs (Furosemide) .Marland Kitchen... Take 1 tablet by mouth two times a day  Orders: Prescription Created Electronically [phone])  BP today: 128/82 Prior BP: 130/80 (10/07/2009)  Prior 10 Yr Risk Heart Disease: Not enough information (10/29/2007)  Labs Reviewed: K+: 4.2 (11/15/2009) Creat: : 0.6 (11/15/2009)   Chol: 139 (11/15/2009)   HDL: 30.90 (11/15/2009)   LDL: 82 (06/21/2009)   TG: 222.0 (11/15/2009)  Complete Medication List: 1)  Symbicort 160-4.5 Mcg/act Aero (Budesonide-formoterol fumarate) .... 2 puffs two times a day 2)  Albuterol  Sulfate (2.5 Mg/45ml) 0.083% Nebu (Albuterol sulfate) .Marland Kitchen.. 1 via neb qid as needed 3)  Amaryl 4 Mg Tabs (Glimepiride) .... Take 1 tab two times a day 4)  Metformin Hcl 500 Mg Tabs (Metformin hcl) .... 2 by mouth two times a day 5)  Crestor 20 Mg Tabs (Rosuvastatin calcium) .Marland Kitchen.. 1 by mouth at bedtime 6)  Fenofibrate 160 Mg Tabs (Fenofibrate) .Marland Kitchen.. 1 by mouth once daily 7)  Metoprolol Tartrate 25 Mg Tabs (Metoprolol tartrate) .... Take 1/2 tab two times a day 8)  Lisinopril 5 Mg Tabs (Lisinopril) .Marland Kitchen.. 1 by mouth once daily 9)  Furosemide 20 Mg Tabs (Furosemide) .... Take 1 tablet by mouth two times a day 10)  Klor-con 10 10 Meq Cr-tabs (Potassium chloride) .Marland Kitchen.. 1 by mouth two times a day 11)  Adult Aspirin Low Strength 81 Mg Tbdp (Aspirin) .... Take 1 tablet by mouth once a day 12)  Cpap  13)  Onetouch Ultra Test Strp (Glucose blood) .... Use as directed once daily.  patient will need ov before future refills. 14)  Zostavax 14782 Unt/0.16ml Solr (Zoster vaccine live) .Marland Kitchen.. 1 ml  im x1  Patient Instructions: 1)  fasting labs 3 months---NMR ,hep 272.4 Prescriptions: CRESTOR 20 MG TABS (ROSUVASTATIN CALCIUM) 1 by mouth at bedtime  #30 x 2   Entered and Authorized by:   Loreen Freud DO   Signed by:   Loreen Freud DO on 11/18/2009   Method used:   Print then Give to Patient   RxID:   [id number]

## 2010-08-18 NOTE — Assessment & Plan Note (Signed)
Summary: Still having bronchitis symptoms/drb   Vital Signs:  Patient profile:   70 year old female Height:      66 inches Weight:      263.6 pounds O2 Sat:      89 % on Room air Temp:     98.7 degrees F Pulse rate:   90 / minute BP sitting:   160 / 94  Vitals Entered By: Shary Decamp (August 30, 2009 2:52 PM)  O2 Flow:  Room air CC: acute only Comments  - dx with bronchitis on 1/22, rx'd biaxin  - finished biaxin about 1 week ago  - still with cough, dyspnea with exertion  ...Marland KitchenMarland KitchenShary Decamp  August 30, 2009 2:53 PM    History of Present Illness: acute only dx with bronchitis on 1/22, rx'd biaxin finished biaxin about 1 week ago still with cough, dyspnea with exertion she's used an albuterol nebulization p.r.n. as well as  symbicort p.r.n.  Current Medications (verified): 1)  Metoprolol Tartrate 25 Mg Tabs (Metoprolol Tartrate) .... Take 1/2 Tab Two Times A Day 2)  Lisinopril 5 Mg Tabs (Lisinopril) .Marland Kitchen.. 1 By Mouth Once Daily 3)  Furosemide 20 Mg Tabs (Furosemide) .... Take 1 Tablet By Mouth Two Times A Day 4)  Klor-Con 10 10 Meq Cr-Tabs (Potassium Chloride) .Marland Kitchen.. 1 By Mouth Two Times A Day 5)  Amaryl 4 Mg  Tabs (Glimepiride) .... Take 1 Tab Two Times A Day 6)  Adult Aspirin Low Strength 81 Mg  Tbdp (Aspirin) .... Take 1 Tablet By Mouth Once A Day 7)  Cpap 8)  Onetouch Ultra Test   Strp (Glucose Blood) .... Use As Directed Once Daily.  Patient Will Need Ov Before Future Refills. 9)  Fenofibrate 160 Mg Tabs (Fenofibrate) .Marland Kitchen.. 1 By Mouth Once Daily 10)  Zostavax 16109 Unt/0.21ml Solr (Zoster Vaccine Live) .Marland Kitchen.. 1 Ml  Im X1 11)  Zocor 40 Mg Tabs (Simvastatin) .Marland Kitchen.. 1 By Mouth  At Bedtime 12)  Albuterol Sulfate (2.5 Mg/77ml) 0.083% Nebu (Albuterol Sulfate) .Marland Kitchen.. 1 Via Neb Qid As Needed 13)  Symbicort 160-4.5 Mcg/act Aero (Budesonide-Formoterol Fumarate) .... 2 Puffs Two Times A Day 14)  Metformin Hcl 500 Mg Tabs (Metformin Hcl) .... 2 By Mouth Two Times A Day  Allergies  (verified): 1)  ! Sulfa 2)  ! Neosporin  Past History:  Past Medical History: COPD, s/p intubation 2007 aprox d/t resp failure  Hypertension Diabetes mellitus, type II  Social History: tobacco -- heavy smoker quit 2007  Review of Systems       denies fevers does have wheezing, clear colored sputum some shortness of breath denies chest pain + mild sinus congestion and clear discharge denies GERD symptoms CBGs between 70 and 120  Physical Exam  General:  alert and well-developed.   Head:  face symmetric not tender Ears:  R ear normal and L ear normal.   Nose:  not congested Mouth:  no redness or discharge Lungs:  normal respiratory effort and no intercostal retractions.  decreased breath sounds, few bilateral end expiratory wheezing.  No respiratory distress   Impression & Recommendations:  Problem # 1:  CHRONIC OBSTRUCTIVE PULMONARY DISEASE, ACUTE EXACERBATION (ICD-491.21) former heavy smoker with persistent wheezing, shortness of breath and cough she is a diabetic plan: X-ray Rx  Augmentin which historically  helped quite a bit see instructions, if no better in the next few days: low dose steroids?  Orders: T-2 View CXR (71020TC)  Complete Medication List: 1)  Dulera 100-5 Mcg/act Aero (  Mometasone furo-formoterol fum) .... 2 puffs two times a day 2)  Albuterol Sulfate (2.5 Mg/13ml) 0.083% Nebu (Albuterol sulfate) .Marland Kitchen.. 1 via neb qid as needed 3)  Amaryl 4 Mg Tabs (Glimepiride) .... Take 1 tab two times a day 4)  Metformin Hcl 500 Mg Tabs (Metformin hcl) .... 2 by mouth two times a day 5)  Zocor 40 Mg Tabs (Simvastatin) .Marland Kitchen.. 1 by mouth  at bedtime 6)  Fenofibrate 160 Mg Tabs (Fenofibrate) .Marland Kitchen.. 1 by mouth once daily 7)  Metoprolol Tartrate 25 Mg Tabs (Metoprolol tartrate) .... Take 1/2 tab two times a day 8)  Lisinopril 5 Mg Tabs (Lisinopril) .Marland Kitchen.. 1 by mouth once daily 9)  Furosemide 20 Mg Tabs (Furosemide) .... Take 1 tablet by mouth two times a day 10)  Klor-con  10 10 Meq Cr-tabs (Potassium chloride) .Marland Kitchen.. 1 by mouth two times a day 11)  Adult Aspirin Low Strength 81 Mg Tbdp (Aspirin) .... Take 1 tablet by mouth once a day 12)  Cpap  13)  Onetouch Ultra Test Strp (Glucose blood) .... Use as directed once daily.  patient will need ov before future refills. 14)  Zostavax 72536 Unt/0.44ml Solr (Zoster vaccine live) .Marland Kitchen.. 1 ml  im x1 15)  Augmentin 875-125 Mg Tabs (Amoxicillin-pot clavulanate) .Marland Kitchen.. 1 by mouth three times a day  Patient Instructions: 1)  XR 2)  rest 3)  fluids 4)  augmentin , antibiotic 5)  Robitussin-DM or mucinex DM  Hunter do fill better 6)  Dulera 2 puffs two times a day--- every day 7)  albuterol nebs as needed for cough-congestion  8)  Please schedule a follow-up appointment in 2 weeks.  9)  call if not improving in the next 5 days  Prescriptions: DULERA 100-5 MCG/ACT AERO (MOMETASONE FURO-FORMOTEROL FUM) 2 puffs two times a day  #1 x 3   Entered and Authorized by:   Elita Quick E. Raylon Lamson MD   Signed by:   Nolon Rod. Aiyana Stegmann MD on 08/30/2009   Method used:   Reprint   RxID:   [id number] AUGMENTIN 875-125 MG TABS (AMOXICILLIN-POT CLAVULANATE) 1 by mouth three times a day  #21 x 0   Entered and Authorized by:   Nolon Rod. Haeden Hudock MD   Signed by:   Nolon Rod. Kekoa Fyock MD on 08/30/2009   Method used:   Print then Give to Patient   RxID:   [id number] DULERA 100-5 MCG/ACT AERO (MOMETASONE FURO-FORMOTEROL FUM) 2 puffs two times a day  #1 x 3   Entered and Authorized by:   Nolon Rod. Maysa Lynn MD   Signed by:   Nolon Rod. Calixto Pavel MD on 08/30/2009   Method used:   Electronically to        [facility]* (retail)       [address]       [place], [place]  [address]       Ph: [phone] or [phone]       Fax: [phone]   RxID:   [id number]

## 2010-08-18 NOTE — Letter (Signed)
Summary: Primary Care Appointment Letter  [facility] [place]  [address] [place], [place] [address]   Phone: [phone]  Fax: [phone]    07/25/2010   MRN: [id number]    AVERLEIGH SAVARY [address] [place], [place]  [address]   Dear Ms. Heidi Stark,     Your Primary Care Physician Loreen Freud DO has indicated that:    ___X____it is time to schedule an appointment for an Annual Exam with Labs.    _______you missed your appointment on______ and need to call and          reschedule.    _______you need to have lab work done.    _______you need to schedule an appointment discuss lab or test results.    _______you need to call to reschedule your appointment that is                       scheduled on _________.     Please call our office as soon as possible. Our phone number is [phone]          _________. Please press option 1. Our office is open 8a-12noon and 1p-5p, Monday through Friday.     Thank you,    [facility]

## 2010-08-18 NOTE — Assessment & Plan Note (Signed)
Summary: ? BRONCHITIS/RH............Marland Kitchen   Vital Signs:  Patient profile:   70 year old female Height:      66 inches Weight:      266 pounds BMI:     43.09 Pulse rate:   73 / minute BP sitting:   130 / 80  Vitals Entered By: R.Peeler CC: bronchitis?-chest congestion, cough-flim since sunday, Cough Comments grandson had bronchitis last wk Taking Mucinex DM   History of Present Illness:  Cough      This is a 70 year old woman who presents with Cough.  The symptoms began 1 week ago.  The patient reports productive cough and wheezing, but denies non-productive cough, pleuritic chest pain, shortness of breath, exertional dyspnea, fever, hemoptysis, and malaise.  The patient denies the following symptoms: cold/URI symptoms, sore throat, nasal congestion, chronic rhinitis, weight loss, acid reflux symptoms, and peripheral edema.  The cough is worse with activity and lying down.  Ineffective prior treatments have included OTC cough medication, throat lozenges, and other asthma medication.    Current Medications (verified): 1)  Symbicort 160-4.5 Mcg/act Aero (Budesonide-Formoterol Fumarate) .... 2 Puffs Two Times A Day 2)  Albuterol Sulfate (2.5 Mg/27ml) 0.083% Nebu (Albuterol Sulfate) .Marland Kitchen.. 1 Via Neb Qid As Needed 3)  Amaryl 4 Mg  Tabs (Glimepiride) .... Take 1 Tab Two Times A Day 4)  Metformin Hcl 500 Mg Tabs (Metformin Hcl) .... 2 By Mouth Two Times A Day 5)  Zocor 40 Mg Tabs (Simvastatin) .Marland Kitchen.. 1 By Mouth  At Bedtime 6)  Fenofibrate 160 Mg Tabs (Fenofibrate) .Marland Kitchen.. 1 By Mouth Once Daily 7)  Metoprolol Tartrate 25 Mg Tabs (Metoprolol Tartrate) .... Take 1/2 Tab Two Times A Day 8)  Lisinopril 5 Mg Tabs (Lisinopril) .Marland Kitchen.. 1 By Mouth Once Daily 9)  Furosemide 20 Mg Tabs (Furosemide) .... Take 1 Tablet By Mouth Two Times A Day 10)  Klor-Con 10 10 Meq Cr-Tabs (Potassium Chloride) .Marland Kitchen.. 1 By Mouth Two Times A Day 11)  Adult Aspirin Low Strength 81 Mg  Tbdp (Aspirin) .... Take 1 Tablet By Mouth Once A  Day 12)  Cpap 13)  Onetouch Ultra Test   Strp (Glucose Blood) .... Use As Directed Once Daily.  Patient Will Need Ov Before Future Refills. 14)  Zostavax 28413 Unt/0.51ml Solr (Zoster Vaccine Live) .Marland Kitchen.. 1 Ml  Im X1 15)  Augmentin 875-125 Mg Tabs (Amoxicillin-Pot Clavulanate) .Marland Kitchen.. 1 By Mouth Two Times A Day  Allergies (verified): 1)  ! Sulfa 2)  ! Neosporin  Past History:  Past medical, surgical, family and social histories (including risk factors) reviewed for relevance to current acute and chronic problems.  Past Medical History: Reviewed history from 08/30/2009 and no changes required. COPD, s/p intubation 2007 aprox d/t resp failure  Hypertension Diabetes mellitus, type II  Family History: Reviewed history from 12/06/2006 and no changes required. Family History Diabetes 1st degree relative Family History of Melanoma  Social History: Reviewed history from 08/30/2009 and no changes required. tobacco -- heavy smoker quit 2007  Review of Systems      See HPI  Physical Exam  General:  Well-developed,well-nourished,in no acute distress; alert,appropriate and cooperative throughout examination Neck:  No deformities, masses, or tenderness noted. Lungs:  R decreased breath sounds and L decreased breath sounds.   Heart:  normal rate.   Psych:  Oriented X3 and normally interactive.     Impression & Recommendations:  Problem # 1:  BRONCHITIS- ACUTE (ICD-466.0)  The following medications were removed from the medication list:  Augmentin 875-125 Mg Tabs (Amoxicillin-pot clavulanate) .Marland Kitchen... 1 by mouth three times a day Her updated medication list for this problem includes:    Symbicort 160-4.5 Mcg/act Aero (Budesonide-formoterol fumarate) .Marland Kitchen... 2 puffs two times a day    Albuterol Sulfate (2.5 Mg/71ml) 0.083% Nebu (Albuterol sulfate) .Marland Kitchen... 1 via neb qid as needed    Augmentin 875-125 Mg Tabs (Amoxicillin-pot clavulanate) .Marland Kitchen... 1 by mouth two times a day  Take antibiotics and  other medications as directed. Encouraged to push clear liquids, get enough rest, and take acetaminophen as needed. To be seen in 5-7 days if no improvement, sooner if worse.  Complete Medication List: 1)  Symbicort 160-4.5 Mcg/act Aero (Budesonide-formoterol fumarate) .... 2 puffs two times a day 2)  Albuterol Sulfate (2.5 Mg/85ml) 0.083% Nebu (Albuterol sulfate) .Marland Kitchen.. 1 via neb qid as needed 3)  Amaryl 4 Mg Tabs (Glimepiride) .... Take 1 tab two times a day 4)  Metformin Hcl 500 Mg Tabs (Metformin hcl) .... 2 by mouth two times a day 5)  Zocor 40 Mg Tabs (Simvastatin) .Marland Kitchen.. 1 by mouth  at bedtime 6)  Fenofibrate 160 Mg Tabs (Fenofibrate) .Marland Kitchen.. 1 by mouth once daily 7)  Metoprolol Tartrate 25 Mg Tabs (Metoprolol tartrate) .... Take 1/2 tab two times a day 8)  Lisinopril 5 Mg Tabs (Lisinopril) .Marland Kitchen.. 1 by mouth once daily 9)  Furosemide 20 Mg Tabs (Furosemide) .... Take 1 tablet by mouth two times a day 10)  Klor-con 10 10 Meq Cr-tabs (Potassium chloride) .Marland Kitchen.. 1 by mouth two times a day 11)  Adult Aspirin Low Strength 81 Mg Tbdp (Aspirin) .... Take 1 tablet by mouth once a day 12)  Cpap  13)  Onetouch Ultra Test Strp (Glucose blood) .... Use as directed once daily.  patient will need ov before future refills. 14)  Zostavax 09811 Unt/0.21ml Solr (Zoster vaccine live) .Marland Kitchen.. 1 ml  im x1 15)  Augmentin 875-125 Mg Tabs (Amoxicillin-pot clavulanate) .Marland Kitchen.. 1 by mouth two times a day Prescriptions: SYMBICORT 160-4.5 MCG/ACT AERO (BUDESONIDE-FORMOTEROL FUMARATE) 2 puffs two times a day  #1 x 0   Entered and Authorized by:   Loreen Freud DO   Signed by:   Loreen Freud DO on 10/07/2009   Method used:   Historical   RxID:   [id number] AUGMENTIN 875-125 MG TABS (AMOXICILLIN-POT CLAVULANATE) 1 by mouth two times a day  #20 x 0   Entered and Authorized by:   Loreen Freud DO   Signed by:   Loreen Freud DO on 10/07/2009   Method used:   Electronically to        [phone]* (retail)        [address] [place], [place]  [address]       Ph: [phone]       Fax: [phone]   RxID:   [id number]

## 2010-08-22 ENCOUNTER — Encounter: Payer: Self-pay | Admitting: Family Medicine

## 2010-08-22 LAB — HM DIABETES EYE EXAM

## 2010-08-23 ENCOUNTER — Encounter: Payer: Self-pay | Admitting: Family Medicine

## 2010-08-24 NOTE — Letter (Signed)
Summary: Pre Visit Letter Revised  [facility]  [address] [place], [place] [address]   Phone: [phone]  Fax: [phone]        08/17/2010 MRN: [id number] Heidi Stark [address] [place], [place]  [address]             Procedure Date: September 21, 2010    dir col-Dr Justin Mend to the Gastroenterology Division at [facility].    You are scheduled to see a nurse for your pre-procedure visit on September 07, 2010 at 9:00am on the 3rd floor at [facility], [address]  We ask that you try to arrive at our office 15 minutes prior to your appointment time to allow for check-in.  Please take a minute to review the attached form.  If you answer "Yes" to one or more of the questions on the first page, we ask that you call the person listed at your earliest opportunity.  If you answer "No" to all of the questions, please complete the rest of the form and bring it to your appointment.    Your nurse visit will consist of discussing your medical and surgical history, your immediate family medical history, and your medications.   If you are unable to list all of your medications on the form, please bring the medication bottles to your appointment and we will list them.  We will need to be aware of both prescribed and over the counter drugs.  We will need to know exact dosage information as well.    Please be prepared to read and sign documents such as consent forms, a financial agreement, and acknowledgement forms.  If necessary, and with your consent, a friend or relative is welcome to sit-in on the nurse visit with you.  Please bring your insurance card so that we may make a copy of it.  If your insurance requires a referral to see a specialist, please bring your referral form from your primary care physician.  No co-pay is required for this nurse visit.     If you cannot keep your appointment, please call [phone] to cancel or reschedule prior to  your appointment date.  This allows [name] the opportunity to schedule an appointment for another patient in need of care.    Thank you for choosing [facility] for your medical needs.  We appreciate the opportunity to care for you.  Please visit [name] at our website  to learn more about our practice.  Sincerely, The Gastroenterology Division
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
## 2010-09-01 NOTE — Letter (Signed)
Summary: Annual breast health update form  Annual breast health update form   Imported By: Sherian Rein 08/24/2010 08:57:42  _____________________________________________________________________  External Attachment:    Type:   Image     Comment:   External Document

## 2010-09-01 NOTE — Assessment & Plan Note (Signed)
   Allergies: 1)  ! Sulfa 2)  ! Neosporin  Diabetes Management Exam:    Eye Exam:       Eye Exam done elsewhere          Date: 08/22/2010          Results: cataracts          Done by: [facility]   Complete Medication List: 1)  Albuterol Sulfate (2.5 Mg/67ml) 0.083% Nebu (Albuterol sulfate) .Marland Kitchen.. 1 via neb qid as needed 2)  Amaryl 4 Mg Tabs (Glimepiride) .... Take 1 tab two times a day 3)  Janumet 50-1000 Mg Tabs (Sitagliptin-metformin hcl) .Marland Kitchen.. 1 by mouth two times a day 4)  Crestor 20 Mg Tabs (Rosuvastatin calcium) .Marland Kitchen.. 1 by mouth at bedtime 5)  Fenofibrate 160 Mg Tabs (Fenofibrate) .Marland Kitchen.. 1 by mouth once daily 6)  Metoprolol Tartrate 25 Mg Tabs (Metoprolol tartrate) .... Take 1/2 tab two times a day 7)  Lisinopril 5 Mg Tabs (Lisinopril) .Marland Kitchen.. 1 by mouth once daily * office visit due now** 8)  Furosemide 20 Mg Tabs (Furosemide) .... Take 1 tablet by mouth two times a day 9)  Klor-con 10 10 Meq Cr-tabs (Potassium chloride) .Marland Kitchen.. 1 by mouth two times a day 10)  Adult Aspirin Low Strength 81 Mg Tbdp (Aspirin) .... Take 1 tablet by mouth once a day 11)  Cpap  12)  Onetouch Ultra Test Strp (Glucose blood) .... Use as directed once daily.  patient will need ov before future refills. 13)  Zostavax 29562 Unt/0.74ml Solr (Zoster vaccine live) .Marland Kitchen.. 1 ml  im x1

## 2010-09-07 NOTE — Letter (Signed)
Summary: Eye Exam/[facility]  Eye Exam/[facility]   Imported By: Maryln Gottron 08/29/2010 10:48:50  _____________________________________________________________________  External Attachment:    Type:   Image     Comment:   External Document

## 2010-09-21 ENCOUNTER — Other Ambulatory Visit: Payer: Self-pay | Admitting: Internal Medicine

## 2010-10-07 ENCOUNTER — Other Ambulatory Visit: Payer: Self-pay | Admitting: Family Medicine

## 2010-10-25 ENCOUNTER — Telehealth: Payer: Self-pay | Admitting: Family Medicine

## 2010-10-25 NOTE — Telephone Encounter (Signed)
Faxed OV, EKG, CXR & Labs to Jodie at [facility] ([phone]).

## 2010-10-31 ENCOUNTER — Encounter: Payer: Self-pay | Admitting: Family Medicine

## 2010-10-31 ENCOUNTER — Ambulatory Visit (INDEPENDENT_AMBULATORY_CARE_PROVIDER_SITE_OTHER): Payer: Self-pay | Admitting: Family Medicine

## 2010-10-31 ENCOUNTER — Encounter: Payer: Self-pay | Admitting: *Deleted

## 2010-10-31 VITALS — BP 120/72 | HR 73 | Temp 98.5°F | Wt 259.4 lb

## 2010-10-31 DIAGNOSIS — J329 Chronic sinusitis, unspecified: Secondary | ICD-10-CM

## 2010-10-31 MED ORDER — FLUTICASONE PROPIONATE 50 MCG/ACT NA SUSP: 2.0000 | Freq: Every day | NASAL | Status: DC

## 2010-10-31 MED ORDER — CEFUROXIME AXETIL 500 MG PO TABS: 500.0000 mg | ORAL_TABLET | Freq: Two times a day (BID) | ORAL | Status: AC

## 2010-10-31 NOTE — Progress Notes (Signed)
  Subjective:     Heidi Stark is a 70 y.o. female who presents for evaluation of sinus pain. Symptoms include: congestion, cough, facial pain, headaches, nasal congestion and sinus pressure. Onset of symptoms was 1 week ago. Symptoms have been gradually worsening since that time. Past history is significant for asthma. Patient is a former smoker, quit 5 years ago.  The following portions of the patient's history were reviewed and updated as appropriate: allergies, current medications, past family history, past medical history, past social history, past surgical history and problem list.  Review of Systems Pertinent items are noted in HPI.   Objective:    BP 120/72  Pulse 73  Temp(Src) 98.5 F (36.9 C) (Oral)  Wt 259 lb 6.4 oz (117.663 kg)  SpO2 95% General appearance: alert, cooperative, appears stated age and no distress Head: Normocephalic, without obvious abnormality, atraumatic Ears: normal TM's and external ear canals both ears Nose: + b/l frontal sinus tenderness,  turb errtythematous and swollen Neck: no adenopathy, no carotid bruit, no JVD, supple, symmetrical, trachea midline and thyroid not enlarged, symmetric, no tenderness/mass/nodules Lungs: wheezes bilaterally Heart: regular rate and rhythm, S1, S2 normal, no murmur, click, rub or gallop Skin: Skin color, texture, turgor normal. No rashes or lesions Lymph nodes: Cervical, supraclavicular, and axillary nodes normal.    Assessment:    Acute bacterial sinusitis.    Plan:    Nasal steroids per medication orders. Ceftin per medication orders.

## 2010-10-31 NOTE — Patient Instructions (Signed)
Sinusitis Sinuses are air pockets within the bones of your face. The growth of bacteria within a sinus leads to infection. Infection keeps the sinuses from draining. This infection is called sinusitis. SYMPTOMS There will be different areas of pain depending on which sinuses have become infected.  The maxillary sinuses often produce pain beneath the eyes.   Frontal sinusitis may cause pain in the middle of the forehead and above the eyes.  Other problems (symptoms) include:  Toothaches.   Colored, pus-like (purulent) drainage from the nose.   Any swelling, warmth, or tenderness over the sinus areas may be signs of infection.  TREATMENT Sinusitis is most often determined by an exam and you may have x-rays taken. If x-rays have been taken, make sure you obtain your results. Or find out how you are to obtain them. Your caregiver may give you medications (antibiotics). These are medications that will help kill the infection. You may also be given a medication (decongestant) that helps to reduce sinus swelling.  HOME CARE INSTRUCTIONS  Only take over-the-counter or prescription medicines for pain, discomfort, or fever as directed by your caregiver.   Drink extra fluids. Fluids help thin the mucus so your sinuses can drain more easily.   Applying either moist heat or ice packs to the sinus areas may help relieve discomfort.   Use saline nasal sprays to help moisten your sinuses. The sprays can be found at your local drugstore.  SEEK IMMEDIATE MEDICAL CARE IF YOU DEVELOP:  High fever that is still present after two days of antibiotic treatment.   Increasing pain, severe headaches, or toothache.   Nausea, vomiting, or drowsiness.   Unusual swelling around the face or trouble seeing.  MAKE SURE YOU:   Understand these instructions.   Will watch your condition.   Will get help right away if you are not doing well or get worse.  Document Released: 07/03/2005 Document Re-Released:  06/15/2008 Exit[employer] Patient Information 2011 [employer]. 

## 2010-11-02 ENCOUNTER — Other Ambulatory Visit: Payer: Self-pay | Admitting: Family Medicine

## 2010-11-10 ENCOUNTER — Other Ambulatory Visit: Payer: Self-pay

## 2010-11-10 MED ORDER — FUROSEMIDE 20 MG PO TABS: 20.0000 mg | ORAL_TABLET | Freq: Two times a day (BID) | ORAL | Status: DC

## 2010-11-30 ENCOUNTER — Telehealth: Payer: Self-pay | Admitting: Family Medicine

## 2010-11-30 MED ORDER — FLUTICASONE-SALMETEROL 250-50 MCG/DOSE IN AEPB: 1.0000 | INHALATION_SPRAY | Freq: Two times a day (BID) | RESPIRATORY_TRACT | Status: DC

## 2010-11-30 NOTE — Telephone Encounter (Signed)
Rx faxed.    KP 

## 2010-11-30 NOTE — Telephone Encounter (Signed)
Says Dr Laury Axon has given her samples for Advair and she is out---please call in prescription [facility] [address] and [address], [employer]

## 2010-12-01 ENCOUNTER — Other Ambulatory Visit: Payer: Self-pay

## 2010-12-01 MED ORDER — ALBUTEROL SULFATE (2.5 MG/3ML) 0.083% IN NEBU: 2.5000 mg | INHALATION_SOLUTION | Freq: Four times a day (QID) | RESPIRATORY_TRACT | Status: DC | PRN

## 2010-12-02 NOTE — Assessment & Plan Note (Signed)
[facility]                             PULMONARY OFFICE NOTE   Heidi Stark, Heidi Stark                       MRN:          [id number]  DATE:08/09/2006                            DOB:          [birth year]    SUBJECTIVE:  Heidi Stark is a 70 year old woman with a history of  obesity, obstructive sleep apnea and a stable right middle lobe nodule.  She returns today for her regularly scheduled followup visit. She tells  me that she is having trouble losing weight, particularly because her  lower extremity claudication limits her ability to walk and exercise.  She has been using her CPAP reliably. She was seen at the end of  December with a syndrome consistent with a sinusitis. She was treated  with antibiotics but did not require any corticosteroids. Her symptoms  are now improved.   CURRENT MEDICATIONS:  1. Vytorin 10/40 mg daily.  2. Lopressor 12.5 mg b.i.d.  3. Lasix 20 mg b.i.d.  4. Lisinopril 5 mg daily.  5. Metformin 850 mg b.i.d.  6. Aspirin 81 mg daily.  7. K-Dur 10 mEq b.i.d.  8. Albuterol 2 puffs q.4 h p.r.n. for shortness of breath.   PHYSICAL EXAMINATION:  GENERAL:  This is an obese, pleasant woman who is  in no distress on room air.  VITAL SIGNS:  Weight 315 pounds, temperature 98.1, blood pressure  136/80, heart rate 78. SPO2 96% on room air.  NECK:  Large but supple without any evidence of stridor. No  lymphadenopathy.  LUNGS:  Clear to auscultation bilaterally with decreased breath sounds  at both bases.  HEART:  Regular rate and rhythm without murmurs.  ABDOMEN:  Obese, soft, nontender with positive bowel sounds.  EXTREMITIES:  No clubbing, cyanosis or edema.  NEUROLOGIC:  Grossly nonfocal exam.   IMPRESSION:  1. Obstructive sleep apnea tolerating CPAP well.  2. Obesity.  3. Peripheral vascular disease.  4. Right middle lobe nodule that has been stable by serial CT scan      since April 2007.   PLAN:  1. Continue CPAP as  ordered.  2. I will perform a CT scan of the chest to follow her nodule and call      her with the results.  3. I will followup with Heidi Stark in 6 months or sooner should she      have any difficulties in the interim.     Leslye Peer, MD  Electronically Signed    RSB/MedQ  DD: 09/17/2006  DT: 09/18/2006  Job #: [id number]   cc:   Lelon Perla, DO  Arturo Morton Riley Kill, MD, [facility]

## 2010-12-02 NOTE — Consult Note (Signed)
Heidi Stark, Heidi Stark NO.:  [id number]   MEDICAL RECORD NO.:  [id number]          PATIENT TYPE:  INP   LOCATION:  2910                         FACILITY:  [facility]   PHYSICIAN:  Rod Holler, MD      DATE OF BIRTH:  10/10/1940   DATE OF CONSULTATION:  11/14/2005  DATE OF DISCHARGE:                                   CONSULTATION   CHIEF COMPLAINT:  Shortness of breath.   Heidi Stark is a 70 year old female who was initially admitted with  complaints of shortness of breath.  The patient was seen by her primary care  physician last week with complaint of a pruritic rash.  Her primary care  physician stopped her blood pressure medicines; the patient received  steroids, and the patient continued to feel worse.  She presented to the  emergency department with respiratory distress, respiratory failure on April  29, was intubated and treated with IV antibiotics, Lasix, and steroids.  Ultimately, the patient was extubated today, though tonight she again had  respiratory distress and was placed on noninvasive ventilation.  The patient  remains on noninvasive ventilation, is markedly short of breath, but is able  to shake her head, stating that she does not have any chest discomfort.   PAST MEDICAL HISTORY:  1.  Hypertension.  2.  Diabetes mellitus.  3.  Tobacco use.  4.  Questionable COPD.  5.  Back surgery.   MEDICATIONS:  1.  Albuterol inhaler 4 times a day.  2.  Heparin drip.  3.  Lasix 40 mg IV daily.  4.  NovoLog insulin.  5.  Lantus insulin 10 units subcutaneously q. 12 h.  6.  Atrovent inhaler 4 times a day.  7.  Solu-Medrol 60 mg IV twice daily.  8.  Nitroglycerin paste.  9.  Protonix 40 mg IV daily.  10. Lopressor 5 mg IV x3.   ALLERGIES:  No known drug allergies.   SOCIAL HISTORY:  The patient is a current smoker, lives in [place], is  married, and has a 50-pack-year smoking history.   FAMILY HISTORY:  Difficult to obtain secondary to patient's  shortness of  breath.   REVIEW OF SYSTEMS:  Difficult to obtain secondary to patient's shortness of  breath.   PHYSICAL EXAMINATION:  VITAL SIGNS:  Heart rate 102, blood pressure 160/107,  temperature 97.1, O2 saturation 97% on noninvasive ventilation.  GENERAL:  Obese female, awake and able to follow commands, in moderate  respiratory distress.  HEENT:  Atraumatic and normocephalic. Pupils equal, round, and reactive to  light.  Extraocular movements intact.  NECK: Thick, supple, no carotid bruits.  CHEST: Decreased air movement throughout with inspiratory and expiratory  wheezing throughout, equal breath sounds bilaterally.  CORONARY: Tachycardic, regular, difficult to hear murmurs secondary to  coarse breath sounds bilaterally, 1+ dorsalis pedis pulses bilaterally.  ABDOMEN:  Soft, nontender, nondistended. Active bowel sounds.  EXTREMITIES:  No clubbing, cyanosis, or edema.  NEUROLOGIC: Following commands and moving all extremities well.  SKIN:  Bilateral upper and lower extremity erythematous macules and papules.   Chest x-ray:  Possible pulmonary edema.   Labs show BNP 269.  CK 179, CK-MB 4.1, CK index 2.3, troponin 0.18.  Sodium  143, potassium 3.3, chloride 104, bicarb 27, BUN 26, creatinine 0.8, glucose  182.  White blood cell count 11,000, hematocrit 37, platelet count 239.   EKG shows sinus tachycardia, frequent PVCs, lateral T wave inversions with  old, mild inferolateral ST segment depression.   IMPRESSION:  The patient is a 70 year old female who presented with  respiratory failure, extubated, again with respiratory distress tonight.  Chest x-ray with possible pulmonary edema, exam with diffuse wheezing.  Unsure if her primary breathing issue is secondary to cardiac versus  pulmonary origin.  She does have mild ECG changes which is most likely  secondary to her hypertension, tachycardia, and respiratory difficulties.   RECOMMENDATIONS:  1.  Agree with giving aspirin,  beta blocker, heparin drip.  2.  Recommend starting low-dose ACE inhibitor.  3.  The patient has been given Lasix already tonight with her respiratory      distress, and I agree with this.  4.  Start a nitroglycerin drip for better blood pressure control and      improved titration ability.  5.  Transthoracic echocardiogram in the morning.  6.  Rule out with serial cardiac enzymes.  7.  Keep magnesium greater than 2, potassium 4 to 5.  8.  I have written for cardiac enzymes, BNP, BMP, and magnesium for now.           ______________________________  Rod Holler, MD     TRK/MEDQ  D:  11/14/2005  T:  11/14/2005  Job:  [id number]

## 2010-12-02 NOTE — Cardiovascular Report (Signed)
NAMEBROOKLEE, MICHELIN NO.:  [id number]   MEDICAL RECORD NO.:  [id number]          PATIENT TYPE:  INP   LOCATION:  2910                         FACILITY:  [facility]   PHYSICIAN:  Arturo Morton. Riley Kill, M.D. [facility] OF BIRTH:  [birth year]   DATE OF PROCEDURE:  11/15/2005  DATE OF DISCHARGE:                              CARDIAC CATHETERIZATION   INDICATIONS:  Ms. Siverson is a 70 year old female who presents with shortness  of breath and acute dyspnea.  She underwent CT scanning, this revealed no  evidence of pulmonary embolism.  There is patchy bilateral airspace disease.  Her lungs today are improved.  Her electrocardiogram demonstrates normal  sinus rhythm with nonspecific ST-T abnormalities and prolonged QT.  The  patient has a long smoking history.  She also has hypertension and diabetes.  She has recently been taken off of her hypertensive medications.   PROCEDURE:  1.  Right and left heart catheterization.  2.  Selective coronary arteriography.  3.  Selective left ventriculography.   DESCRIPTION OF PROCEDURE:  Informed consent was obtained by Dr. Dietrich Pates.  I  spoke with the patient prior to catheterization and explained the procedure  to her.  She was brought to the catheterization laboratory and prepped and  draped in the usual fashion.  Through an anterior puncture the femoral vein  was entered.  Right heart catheterization with a 7.5 French thermodilution  Swan-Ganz catheter was taken to the superior vena cava where saturation was  obtained.  Sequential right heart pressures were then obtained.  Thermodilution cardiac outputs were performed and a PA saturation obtained  as well.  Following this the right femoral artery was entered using a SMART  needle.  A 6-French sheath was placed.  Central aortic and left ventricular  pressures were measured with a pigtail.  Simultaneous wedge LV were  obtained.  Simultaneous LV/RV were obtained as well.  Saturations were  obtained from the left heart.  Following this ventriculography was done in  the RAO projection.  Following this standard coronary arteriography was done  without complication.  She was subsequently taken to the holding area in  satisfactory clinical condition.  I explained the results of the findings to  her family.  There were no complications noted.   HEMODYNAMIC DATA.:  1.  Right atrial pressure 10.  2.  RV 41/11.  3.  Pulmonary artery 37/22.  4.  Pulmonary capillary wedge 22.  5.  Aortic 143/77.  6.  LV 141/22.  7.  No gradient on pullback across the aortic valve.  8.  No RV/LV equilibration.  9.  Fick cardiac output 6.6 liters per minute.  10. Fick cardiac index 2.7 liters per minute per meter squared.  11. Thermodilution cardiac outputs 10.9 liters per minute.  12. Thermodilution cardiac index 4.52 liters per minute per meter squared.  13. Superior vena cava saturation 69%.  14. Pulmonary artery saturation 71%.  15. Aortic saturation 91%.   ANGIOGRAPHIC DATA.:  1.  Ventriculography was done in the RAO projection.  Overall systolic      function appeared preserved without a definite  wall motion abnormality.      There is evidence of some mitral annular calcification and there did not      appear to be significant mitral regurgitation.  2.  The left main coronary artery is free of critical disease.  3.  The LAD courses to the apex and wraps the apical tip and provides the      distal inferior wall.  There is a major diagonal branch.  The LAD and      its branches have minimal luminal irregularity but no significant high-      grade focal disease.  4.  The circumflex provides a small ramus intermedius without significant      narrowing.  There is a large marginal branch that goes over to the      anterolateral wall, and it is also free of critical disease.  5.  The right coronary artery is a fairly large-caliber vessel.  It provides      a small posterior descending and  modest-sized posterolateral branch.      There is some ostial spasm noted which was relieved with intracoronary      nitroglycerin.  The RCA throughout is otherwise smooth without critical      narrowing.   CONCLUSIONS:  1.  Preserved overall left ventricular function.  2.  Mild pulmonary hypertension with elevated end-diastolic and left      ventricular pulmonary capillary wedge pressure.  3.  Elevated cardiac output.  4.  No critical coronary obstruction.   DISPOSITION:  The patient likely has potentially sleep apnea.  In addition,  she has hypertension and diabetes.  Strict blood pressure control,  discontinuation of smoking, resolution of chronic obstructive pulmonary  disease, and also aggressive risk factor management would be recommended  here.  She will need close followup with Dr. Dietrich Pates.      Arturo Morton. Riley Kill, M.D. [facility]  Electronically Signed     TDS/MEDQ  D:  11/15/2005  T:  11/15/2005  Job:  [id number]   cc:   Leslye Peer, M.D.   [place] Bing, M.D. [facility]  [address]  [address]  [place]  [place] [address]   CV Laboratory

## 2010-12-02 NOTE — Assessment & Plan Note (Signed)
[facility]                             PULMONARY OFFICE NOTE   Heidi Stark, Heidi Stark                       MRN:          [id number]  DATE:07/12/2006                            DOB:          1941-05-15    Heidi Stark is a 70 year old white female with obstructive sleep apnea,  but no degree of significant airflow obstruction.  Off all inhaled  medications, significant morbid obesity.  Wearing CPAP along with oxygen  HS.  She comes in today with increased chest congestion, cough  productive of thick, yellow-green, mucous and hoarseness.  Increased  postnasal drainage and dyspnea.   She maintains:  1. Vytorin 10/20 daily.  2. Lopressor one-half 25 mg b.i.d.  3. Lasix 20 mg b.i.d.  4. Potassium b.i.d. 10 mEq.  5. Lisinopril 5 mg daily.  6. Metformin 850 twice daily.  7. Aspirin 81 mg daily.   PHYSICAL EXAMINATION:  Temp 98.  Blood pressure 130/80, pulse 77,  saturation 96% room air.  CHEST:  Showed to be completely clear without evidence of wheeze, rale,  or rhonchi.  CARDIAC EXAM:  Showed a regular rate and rhythm without S3.  Normal S1,  S2.  ABDOMEN:  Protuberant and obese.  EXTREMITIES:  Showed no edema or clubbing or venous disease.  SKIN:  Clear.  NEUROLOGIC EXAM:  Intact.  HEENT:  Showed no jugular venous distension, no lymphadenopathy,  oropharynx clear.  Neck supple.  Naris showed mild nasal purulence  bilaterally and posterior pharyngeal drainage.   IMPRESSION:  Mild acute sinusitis with early bronchitis.  Note is made  of spirometry:  Showing normal spirometry today with no evidence of  airflow obstruction.  This confirms previous data per Dr. Delton Coombes, and a  previous pulmonary functions in July of 2007.   RECOMMENDATIONS:  For the patient to receive Augmentin 875 mg twice  daily for 10 days.  Patient is to receive Nasacort 2 sprays each nostril  daily for nasal inflammation.  Mucinex is prescribed at 2 b.i.d. for 1  week.  Then she  will return and she will also repulse prednisone at 40  mg a day, taper down to 10 mg every 2 days until off.  She is to return  to see Dr. Delton Coombes in 1 month's time.     Charlcie Cradle Delford Field, MD, [facility]  Electronically Signed    PEW/MedQ  DD: 07/12/2006  DT: 07/12/2006  Job #: [id number]   cc:   Leslye Peer, MD

## 2010-12-02 NOTE — Discharge Summary (Signed)
NAMEKIMBRIA, Stark NO.:  [id number]   MEDICAL RECORD NO.:  [id number]          PATIENT TYPE:  INP   LOCATION:  2018                         FACILITY:  [facility]   PHYSICIAN:  Leslye Peer, M.D.  DATE OF BIRTH:  08-24-1940   DATE OF ADMISSION:  11/12/2005  DATE OF DISCHARGE:  11/17/2005                                 DISCHARGE SUMMARY   DISCHARGE DIAGNOSES:  1.  Vent-dependent respiratory failure with a history of chronic obstructive      pulmonary disease exacerbation, pulmonary edema, diastolic dysfunction.  2.  Probable obstructive sleep apnea.  3.  Diabetes mellitus.  4.  Pulmonary nodule.  5.  Hypertension.   HISTORY OF PRESENT ILLNESS:  [name] is a 70 year old white female smoker  with a history of hypertension, diabetes mellitus per family.  She had been  doing well lately except for a pleuritic rash on bilateral lower  extremities.  For that reason she had been taken off a majority of her  medications per primary care physician to determine if her medications were  creating the rash.  Around midnight she complained of acute dyspnea.  She is  admitted to the [facility] Emergency Department, found to be in acute respiratory  distress proved refractory to treatment with IV Lasix, required intubation  by mechanical ventilatory support.   LABORATORY DATA:  Cardiac catheterization performed on Nov 15, 2005 where the  right and left heart catheterization demonstrated __________. RA pressure  approximately 10, RV pressure 41/11, PA pressure 37/22, pulmonary capillary  wedge pressure 22, aortic pressure 143/77 with saturations of SVC of 69, PA  71, and AO of 91.  Her cardiac index by Fick was 2.7, by thermo was 4.52.  Arterial blood gas pH 7.1, pCO2 of 46, and a pO2 of 62%.  Arterial angiogram  did not demonstrate any critical obstructions.  Further laboratory data:  12-  lead EKG shows sinus rhythm with PVCs with T-wave abnormality, ventricular  rate of 73  beats per minute.  Chest x-ray shows stable cardiomegaly with  polyp, mild pulmonary vascular congestion.  BNP was 91, WBC was 10.9,  hemoglobin 13.5, hematocrit 40, platelets 252.  Sodium 140, potassium 3.4,  chloride 99, CO2 32, glucose 125, BUN 25, creatinine 0.7, calcium 8.4.  Arterial blood gas pH 7.38, pCO2 46, pO2 132.  AST is 44, ALT is 79, ALP is  59.  Total bilirubin 0.5.  CK peaked at 868, CK-MB is 12.5 with a troponin I  of 0.34.  Respiratory cultures showed non-pathogenic oral flora.   HOSPITAL COURSE:  #1 - VENT-DEPENDENT RESPIRATORY FAILURE:  She was admitted  to [facility] and found to be in acute respiratory distress, most  likely pulmonary edema.  She was noted to be wheezing, was treated with  systemic steroids along with aggressive diuresis.  She reached maximal  benefit on November 13, 2005, was successfully liberated from mechanical  ventilatory support.  She subsequently did develop acute wheezing and acute  dyspnea.  Cardiology evaluation was obtained.  She had a cardiac  catheterization, did not demonstrate any  critical arterial obstructions.  She did have adequate cardiac index.   #2 - HYPERTENSION:  Hypertension was addressed with a change in medications  with lisinopril along with continuation of Lasix and the addition of low-  dose Lopressor.   #3 - PROBABLE OBSTRUCTIVE SLEEP APNEA:  She was fitted with autoset CPAP  while in the hospital and will continue this on an outpatient basis with 2 L  oxygen __________  demonstrated by her decreased O2 saturation.   #4 - DIABETES MELLITUS:  She had poor control of steroid exacerbation.  She  will be restarted on her Avandamet as an outpatient and her steroids will be  weaned off.  She will be followed in the office by nurse practitioner  Parrett to maintain her adequate glucose control.   #5 - PULMONARY NODULE:  This will be followed as an outpatient.   #6 - MORBID OBESITY:  She has been only weighed once  while in the hospital.  This was after aggressive diuresis of more than 5 L.  Her discharge weight  was 302 pounds.   DISCHARGE MEDICATIONS:  1.  Aspirin 325 mg once a day.  2.  Vytorin 10/20 one a day.  3.  Lopressor 25 mg one-half tablet two times a day.  4.  Lasix 20 mg once a day.  5.  K-Dur 10 mEq once a day.  6.  Spiriva.  This is a new medication 18 mcg one puff daily.  7.  Lisinopril 5 mg daily.  8.  Prednisone 10 mg, 20 mg for three days, 10 mg for three days, and stop.  9.  Oxygen 2 L 24 hours/7.  10. CPAP with autoset with a 2 L bleed-in any time she is sleeping.  11. Avandamet 4/500 two times a day.  12. Albuterol puffer two puffs as a rescue medication not to exceed four      times a day.   SPECIAL INSTRUCTIONS:  She is to weigh herself daily and keep a record and  keep to the office visit.  She is to use her CPAP any time she is sleeping  whether she is in a chair or in the bed.  She is being discharged in  improved condition.  Her diet is a low fat, low carbohydrate, no added salt  with caloric restriction.      Brett Canales Minor, A.C.N.P. [facility]    ______________________________  Leslye Peer, M.D.    SM/MEDQ  D:  11/17/2005  T:  11/17/2005  Job:  [id number]   cc:   Titus Dubin. Alwyn Ren, M.D. [facility]  [phone]  [place]  [place] [address]   Amy ?, M.D.  [facility]   Maisie Fus D. Riley Kill, M.D. [facility]  [address]  [address]  [place]  [place] [address]

## 2010-12-02 NOTE — Procedures (Signed)
[name], Heidi Stark NO.:  [id number]   MEDICAL RECORD NO.:  [id number]          PATIENT TYPE:  OUT   LOCATION:  SLEEP CENTER                 FACILITY[facility]  [facility]   PHYSICIAN:  Marcelyn Bruins, M.D. [facility] DATE OF BIRTH:  March 26, 1941   DATE OF STUDY:  01/09/2006                              NOCTURNAL POLYSOMNOGRAM    REFERRING PHYSICIAN:  Dr. Levy Pupa   INDICATION FOR THE STUDY:  Hypersomnia with sleep apnea.   EPWORTH SCORE:  12.   SLEEP ARCHITECTURE:  The patient had a total sleep time of 245 minutes with  significantly decreased slow wave sleep in REM.  Sleep onset latency was  normal and REM onset was prolonged at 321 minutes.  Sleep efficiency was  very decreased at 60%.   RESPIRATORY DATA:  The patient was found to have 195 hypopneas, 138  obstructive apneas, and 8 central apneas, for a respiratory disturbance  index of 84 events per hour.  Events were not positional and loud snoring  was noted throughout.  The patient did not meet split night criteria  secondary to her significantly reduced total sleep time.   OXYGEN/DATA:  There was O2 desaturation as low as 77% with her obstructive  events.   CARDIAC DATA:  Occasional PVC/fusion beats movement/parasomnia:  No  clinically significant events were noted.   IMPRESSION/RECOMMENDATIONS:  1.  Severe obstructive sleep apnea/hypopnea syndrome with a respiratory      disturbance index of 84 events per hour and O2 desaturation as low as      77%.  The patient did not meet split night criteria secondary to her      significantly decreased total sleep time.  Treatment for this degree of      sleep apnea should focus primarily on weight loss as well as CPAP.  2.  Occasional PVC/fusion beats noted during the study.  There were no      clinically significant cardiac arrhythmias.           ______________________________  Marcelyn Bruins, M.D. [facility]  Diplomate, American Board of Sleep  Medicine     KC/MEDQ  D:   02/05/2006 16:45:17  T:  02/05/2006 21:18:56  Job:  [id number]

## 2010-12-02 NOTE — Op Note (Signed)
NAMESHEVA, MCDOUGLE                ACCOUNT NO.:  [id number]   MEDICAL RECORD NO.:  [id number]          PATIENT TYPE:  AMB   LOCATION:  DSC                          FACILITY:  [facility]   PHYSICIAN:  Harvie Junior, M.D.   DATE OF BIRTH:  [birth year]   DATE OF PROCEDURE:  06/02/2005  DATE OF DISCHARGE:                                 OPERATIVE REPORT   PREOPERATIVE DIAGNOSIS:  Carpal tunnel syndrome, left.   POSTOPERATIVE DIAGNOSIS:  Carpal tunnel syndrome, left.   PROCEDURE:  1.  Left carpal tunnel release.  2.  Debridement of tenosynovium of flexor tendons, left.   SURGEON:  Harvie Junior, M.D.   ASSISTANT:  Marshia Ly, P.A.-C.   ANESTHESIA:  Forearm based IV regional.   BRIEF HISTORY:  Ms. Ocampo is a 70 year old female with a long history of  having significant carpal tunnel type symptoms documented by EMGs.  She was  having significant nighttime pain and clumsiness and because of failure of  conservative care with injection therapy, anti-inflammatory medication, and  wrist splint, she is taken to the operating room for carpal tunnel release.   DESCRIPTION OF PROCEDURE:  The patient was taken to the operating room and  after adequate anesthesia was obtained with a forearm based regional, the  patient was placed supine upon the operating table.  The left arm was  prepped and draped in the usual sterile fashion.  Following this, a small  incision was made just ulnar to the midline wrist crease.  The subcutaneous  tissue were taken down to the level of the volar carpal ligament which was  divided carefully with a knife.  The median nerve was identified below, a  Therapist, nutritional was used to make sure the nerve was not adherent, and then  the volar carpal ligament was divided both proximally and distally with the  curved Mayo scissors, care being taken to curve the curve of the blade away  from the medial side.  At that point, the median nerve was identified and  noted to be  significantly compressed in the area of the carpal ligament and  looked to not be very healthy.  At that point, it was felt that exploration  of the canal seemed appropriate and the flexor tendons were identified and  noted to have a tremendous amount of tenosynovium at the level of the  compression of the carpal tunnel.  At that point, each of the flexor tendons  were identified and a tenosynovectomy was performed on the flexor tendons  and once this was accomplished, certainly the room in the canal was  dramatically greater than it had been previously.  At this point, the wound  was copiously irrigated and suctioned dry.  The skin was closed with a  combination of interrupted and running 4-0 nylon suture.  A sterile  compressive dressing was applied as well as a volar plaster and the patient  was taken to the recovery room and noted to be in satisfactory condition.  Estimated blood loss was none.      Harvie Junior, M.D.  Electronically  Signed     JLG/MEDQ  D:  06/02/2005  T:  06/02/2005  Job:  [id number]

## 2010-12-02 NOTE — Assessment & Plan Note (Signed)
[facility]                               PULMONARY OFFICE NOTE   Stark, Heidi Stark                       MRN:          [id number]  DATE:02/02/2006                            DOB:          1940/10/27    SUBJECTIVE:  Heidi Stark is a 70 year old woman with obstructive sleep apnea  and obesity, hypoventilation syndrome, chronic obstructive pulmonary disease  and a right middle lobe pulmonary nodule that has been stable on CT scan.  She returns today for regular followup.  She tells me that in general her  breathing is doing well.  She does have occasional exertional dyspnea.  She  has been making concerted  effort to lose weight and has joined [employer].  She is wearing her CPAP as currently prescribed although we have  not had recent CPAP titration.   MEDICATIONS:  1.  Vytorin 10/20 mg daily.  2.  Lopressor 12.5 mg b.i.d.  3.  Lasix 20 mg b.i.d.  4.  K-Dur 10 mEq daily.  5.  Spiriva one inhalation daily.  6.  Lisinopril 5 mg daily.  7.  Oxygen which she wears only q.h.s. with her CPAP.  8.  Metformin 850 mg b.i.d.  9.  Aspirin 81 mg daily.  10. Albuterol two puffs q.4h. p.r.n.   PHYSICAL EXAMINATION:  GENERAL:  This pleasant woman is in no distress on  room air.  She is obese.  HEENT:  Benign.  LUNGS:  Clear to auscultation bilaterally without wheezing.  HEART:  Regular rate and rhythm without murmurs.  ABDOMEN:  Soft and nontender with positive bowel sounds.  EXTREMITIES:  No clubbing, cyanosis or edema.   STUDIES:  Pulmonary function tests were performed on January 18, 2006.  These  showed basically normal spirometry without any evidence of a bronchodilator  response.  Her lung volume were slightly diminished, her total lung capacity  was 77% of predicted, her diffusion capacity was also slightly diminished at  68% of predicted but corrected into the normal range for her alveolar  volume.   IMPRESSION:  1.  Obstructive sleep apnea,  obesity,  hypoventilation syndrome.  2.  History of tobacco without evidence of air flow limitation on pulmonary      function testing.  3.  Right middle lobe pulmonary nodule.   PLAN:  1.  We will repeat her CT scan of the chest with contrast in late July to      follow up her nodule  for stability.  2.  She will continue her Spiriva for now but we should consider      discontinuing this medication given her minimal to nonexistent air flow      limitation on PFTs.  3.  We will arrange to adjust her CPAP with a formal titration study given      the difficulty interpreting her auto titration device data.  4.  She will follow up with me in six months or sooner if any difficulties      arise.  Heidi Peer, MD   RSB/MedQ  DD:  02/13/2006  DT:  02/13/2006  Job #:  [id number]   cc:   Heidi Freud, MD

## 2010-12-02 NOTE — H&P (Signed)
NAMEVELA, RENDER NO.:  [id number]   MEDICAL RECORD NO.:  [id number]            PATIENT TYPE:   LOCATION:                                 FACILITY:   PHYSICIAN:  Leslye Peer, M.D.       DATE OF BIRTH:   DATE OF ADMISSION:  11/12/2005  DATE OF DISCHARGE:                                HISTORY & PHYSICAL   CHIEF COMPLAINT:  Acute dyspnea.   BRIEF HISTORY:  A 70 year old woman with a obesity tobacco use,  hypertension, diabetes mellitus per her family.  She is been well lately  except for a pruritic rash that began last week on her bilateral lower  extremities.  She sought care at an urgent care facility for this rash, and  all of her blood pressure medications were discontinued on Wednesday she was  given prednisone and received a corticosteroid injection.  Her rash began to  get better.  Per family she has not had any fevers, chills, change in her  cough, sputum production, weight changes, or any other systemic symptoms; in  fact she was acting normally at dinner time prior to her presentation.  Then, around midnight she called her husband secondary to acute dyspnea.  He  says that she does not complain of any chest pain.  She was brought to the  emergency department; and was be in respiratory distress; and was intubated.  She has received Zosyn n empirically and 40 mg of IV Lasix with a good  diuresis.   PAST MEDICAL HISTORY:  1.  Hypertension.  2.  Active type 2 diabetes mellitus not currently on medications.  3.  Tobacco use with frequent bronchitis and probable COPD not on therapy.  4.  History of back surgery which was complicated by an arterial bleed and      subsequent graft placement.  There is some question as to whether her      graft is still patent per her daughter.   ALLERGIES:  No known drug allergies.   MEDICATIONS:  1.  A Prednisone taper.  She has completed 2 days of this.  2.  Her other medications which have recently been  DISCONTINUED are Vytorin,      Lasix, HCTZ, and Diovan.   SOCIAL HISTORY:  The patient lives with her husband.  She works as an Engineer, manufacturing for a Product/process development scientist.  She is a 50-pack-year tobacco smoker; and  continues to smoke.  She does not use any alcohol.   FAMILY HISTORY:  Noncontributory.   PHYSICAL EXAMINATION:  GENERAL:  This is a obese woman who is intubated.  She is coughing.  She opens her eyes and grimaces to stimulation and voice.  VITAL SIGNS:  Temperature 97.0, blood pressure 135/67, heart rate 77,  respiratory rate 22, SpO2 of 99% on 60% FIO2 was supplied with PEEP.  HEENT:  There is an endotracheal tube in place.  Her pupils are equal,  round, and react to light and accommodation.  NECK:  The neck is thick, but without any lymphadenopathy or JVD.  LUNGS:  Have diffuse bilateral expiratory wheezes.  HEART:  Regular with a distant S1-S2 with no murmur.  ABDOMEN:  Obese, soft, nontender with positive bowel sounds.  EXTREMITIES:  Trace pretibial edema.  SKIN:  A very pale patchy, blanching rash on both shins to the knees.  NEUROLOGICALLY:  She wakes easily and opens her eyes.  She is moving all  extremities   LABORATORY:  White blood cell count 14.8 with a differential 66%  neutrophils, 28% lymphocytes, and 5% monocytes, hematocrit 43.3 platelets  324.  Sodium 136, potassium 4, chloride 104, CO2 24, BUN 22, creatinine 1.0,  glucose 389, calcium 7.7.  BNP 209, albumin 3.3, AST 136, ALT 106, alkaline  phosphatase 61, total bilirubin 30.6.   CHEST X-RAY:  In the emergency department shows the endotracheal tube is 1  cm above carina.  She has a bilateral interstitial prominence that looks  consistent with pulmonary edema.  There is also some possible increased  right upper lobe opacity.   IMPRESSION:  1.  Acute respiratory failure.  I suspect that this pulmonary edema,      possibly secondary to diastolic dysfunction secondary to stopping her      meds and also her  diuretics.  She is not currently hypertensive.      However, one must also consider COPD with an exacerbation.  She is not      currently on any bronchodilators; and this would be her first      exacerbation, but she is quite wheezy.  She will be covered for the      community-acquired pneumonia, but she has no segmental or lobar      infiltrates on chest x-ray; and she has a normal differential on her      white blood cell count which is elevated, probably due to prednisone.  I      am concerned that she is at risk for pulmonary embolism secondary to her      obesity and also to her old lower extremity grafts.  Pulmonary embolism      could have been the inciting event for either pulmonary or a chronic      obstructive pulmonary disease exacerbation.  We will rule this out.  2.  Diabetes mellitus.  3.  Hypertension.  4.  Mild transaminitis.   PLANS:  1.  We will change her tidal volumes to 8 mL/kg.  2.  We will check an ABG, now; and we will wean her O2 is tolerated.  3.  We will perform a CT pulmonary angiogram to look for pulmonary embolism.  4.  We will check a cardiac panel.  5.  We will consider a transthoracic echocardiogram, tomorrow.  6.  We will continue her diuresis with standing Lasix and p.r.n. doses as      needed.  7.  We will continue scheduled bronchodilators and start Solu-Medrol,      possible air flow limitation.  8.  We will cover her for community-acquired pneumonia after the      bronchoalveolar lavage is performed with quantitative cultures  9.  We will initiate sliding scale insulin coverage.  10. Prophylaxis would be with a proton pump inhibitor and enoxaparin.  11. We will restart her blood pressure medications after she stabilizes.  12. We will follow her transaminases to ensure that they do not continue to      elevate.           ______________________________  Leslye Peer, M.D.  RSB/MEDQ  D:  11/12/2005  T:  11/12/2005  Job:  [id number]   cc:    Titus Dubin. Alwyn Ren, M.D. [facility]  [phone] [place]  [place] [address]

## 2010-12-09 ENCOUNTER — Other Ambulatory Visit: Payer: Self-pay

## 2010-12-09 MED ORDER — METOPROLOL TARTRATE 25 MG PO TABS: 12.5000 mg | ORAL_TABLET | Freq: Two times a day (BID) | ORAL | Status: DC

## 2010-12-13 ENCOUNTER — Other Ambulatory Visit: Payer: Self-pay | Admitting: Family Medicine

## 2010-12-13 MED ORDER — ROSUVASTATIN CALCIUM 20 MG PO TABS: 20.0000 mg | ORAL_TABLET | Freq: Every day | ORAL | Status: DC

## 2010-12-13 MED ORDER — SITAGLIPTIN PHOS-METFORMIN HCL 50-1000 MG PO TABS: 1.0000 | ORAL_TABLET | Freq: Two times a day (BID) | ORAL | Status: DC

## 2010-12-24 ENCOUNTER — Encounter: Payer: Self-pay | Admitting: Family Medicine

## 2010-12-29 ENCOUNTER — Other Ambulatory Visit (INDEPENDENT_AMBULATORY_CARE_PROVIDER_SITE_OTHER): Payer: Medicare Other

## 2010-12-29 DIAGNOSIS — E785 Hyperlipidemia, unspecified: Secondary | ICD-10-CM

## 2010-12-29 DIAGNOSIS — E119 Type 2 diabetes mellitus without complications: Secondary | ICD-10-CM

## 2010-12-29 LAB — HEMOGLOBIN A1C: Hgb A1c MFr Bld: 6.9 % — ABNORMAL HIGH (ref 4.6–6.5)

## 2010-12-29 LAB — BASIC METABOLIC PANEL WITH GFR
BUN: 23 mg/dL (ref 6–23)
CO2: 29 meq/L (ref 19–32)
Calcium: 8.9 mg/dL (ref 8.4–10.5)
Chloride: 103 meq/L (ref 96–112)
Creatinine, Ser: 0.7 mg/dL (ref 0.4–1.2)
GFR: 89.67 mL/min
Glucose, Bld: 151 mg/dL — ABNORMAL HIGH (ref 70–99)
Potassium: 4.2 meq/L (ref 3.5–5.1)
Sodium: 140 meq/L (ref 135–145)

## 2010-12-29 LAB — LIPID PANEL
Cholesterol: 131 mg/dL (ref 0–200)
HDL: 32.9 mg/dL — ABNORMAL LOW (ref >39.00)
VLDL: 42.8 mg/dL — ABNORMAL HIGH (ref 0.0–40.0)

## 2010-12-29 NOTE — Progress Notes (Signed)
Labs only

## 2011-01-09 ENCOUNTER — Encounter: Payer: Self-pay | Admitting: *Deleted

## 2011-01-11 ENCOUNTER — Other Ambulatory Visit: Payer: Self-pay | Admitting: Family Medicine

## 2011-01-11 MED ORDER — ROSUVASTATIN CALCIUM 20 MG PO TABS: 20.0000 mg | ORAL_TABLET | Freq: Every day | ORAL | Status: DC

## 2011-01-11 NOTE — Telephone Encounter (Signed)
Labs done---Rx sent with 5 refills     KP

## 2011-01-13 ENCOUNTER — Other Ambulatory Visit: Payer: Self-pay | Admitting: Family Medicine

## 2011-01-13 MED ORDER — SITAGLIPTIN PHOS-METFORMIN HCL 50-1000 MG PO TABS: 1.0000 | ORAL_TABLET | Freq: Two times a day (BID) | ORAL | Status: DC

## 2011-01-13 NOTE — Telephone Encounter (Signed)
Labs UTD. Rx faxed     KP 

## 2011-02-06 ENCOUNTER — Other Ambulatory Visit: Payer: Self-pay | Admitting: Family Medicine

## 2011-02-06 MED ORDER — FENOFIBRATE 160 MG PO TABS: 160.0000 mg | ORAL_TABLET | Freq: Every day | ORAL | Status: DC

## 2011-02-06 NOTE — Telephone Encounter (Signed)
Done

## 2011-04-02 ENCOUNTER — Other Ambulatory Visit: Payer: Self-pay | Admitting: Family Medicine

## 2011-04-10 ENCOUNTER — Other Ambulatory Visit: Payer: Self-pay | Admitting: Family Medicine

## 2011-04-13 ENCOUNTER — Telehealth: Payer: Self-pay | Admitting: Family Medicine

## 2011-04-13 NOTE — Telephone Encounter (Signed)
mssg left to call the office    KP 

## 2011-04-13 NOTE — Telephone Encounter (Signed)
meformin was not strong enough---lower dose to janumet xr 50/500  1 po qd  ----can give samples to start

## 2011-04-13 NOTE — Telephone Encounter (Signed)
Pt c/o BS dropping. Pt BS fasting run around 110, BS 1.5 hours after meal 190. Pt notes that about 1 hour after eating BS drop to 113-120 and as low as 69. Pt c/o shaking and sweating when BS drop. Pt thinks that janumet may be too strong and wonders if she should change back to Metformin 500 mg bid.. Please advise

## 2011-04-14 MED ORDER — SITAGLIPTIN PHOS-METFORMIN HCL 50-500 MG PO TABS: 1.0000 | ORAL_TABLET | Freq: Two times a day (BID) | ORAL | Status: DC

## 2011-04-14 NOTE — Telephone Encounter (Signed)
Discussed with patient and she agreed to start the janumet 50-500 and will stop by to pick up on Monday.      KP

## 2011-05-08 ENCOUNTER — Other Ambulatory Visit: Payer: Self-pay | Admitting: Family Medicine

## 2011-05-08 MED ORDER — FUROSEMIDE 20 MG PO TABS: 20.0000 mg | ORAL_TABLET | Freq: Two times a day (BID) | ORAL | Status: DC

## 2011-05-08 NOTE — Telephone Encounter (Signed)
Faxed.   KP 

## 2011-05-15 ENCOUNTER — Telehealth: Payer: Self-pay | Admitting: Family Medicine

## 2011-05-15 MED ORDER — SITAGLIPTIN PHOS-METFORMIN HCL 50-500 MG PO TABS: 1.0000 | ORAL_TABLET | Freq: Two times a day (BID) | ORAL | Status: DC

## 2011-05-15 NOTE — Telephone Encounter (Signed)
Patient was given samples of janumet & was told to call when she finished them - she isnt sure if dr Laury Axon wants to call rx in ---- [facility]

## 2011-05-15 NOTE — Telephone Encounter (Signed)
Rx faxed.    KP 

## 2011-05-31 ENCOUNTER — Other Ambulatory Visit: Payer: Self-pay | Admitting: Family Medicine

## 2011-06-02 ENCOUNTER — Other Ambulatory Visit: Payer: Self-pay | Admitting: Family Medicine

## 2011-07-10 ENCOUNTER — Other Ambulatory Visit: Payer: Self-pay | Admitting: Family Medicine

## 2011-07-19 ENCOUNTER — Other Ambulatory Visit: Payer: Self-pay | Admitting: Family Medicine

## 2011-07-21 MED ORDER — ROSUVASTATIN CALCIUM 20 MG PO TABS: 20.0000 mg | ORAL_TABLET | Freq: Every day | ORAL | Status: DC

## 2011-07-21 NOTE — Telephone Encounter (Signed)
Letter mailed     KP 

## 2011-07-28 ENCOUNTER — Other Ambulatory Visit: Payer: Self-pay | Admitting: Family Medicine

## 2011-07-28 DIAGNOSIS — E785 Hyperlipidemia, unspecified: Secondary | ICD-10-CM

## 2011-07-31 ENCOUNTER — Other Ambulatory Visit (INDEPENDENT_AMBULATORY_CARE_PROVIDER_SITE_OTHER): Payer: 59

## 2011-07-31 DIAGNOSIS — E785 Hyperlipidemia, unspecified: Secondary | ICD-10-CM

## 2011-07-31 DIAGNOSIS — Z0000 Encounter for general adult medical examination without abnormal findings: Secondary | ICD-10-CM

## 2011-07-31 DIAGNOSIS — E119 Type 2 diabetes mellitus without complications: Secondary | ICD-10-CM

## 2011-07-31 LAB — BASIC METABOLIC PANEL WITH GFR
BUN: 16 mg/dL (ref 6–23)
CO2: 28 meq/L (ref 19–32)
Calcium: 8.3 mg/dL — ABNORMAL LOW (ref 8.4–10.5)
Chloride: 98 meq/L (ref 96–112)
Creatinine, Ser: 0.6 mg/dL (ref 0.4–1.2)
GFR: 97.63 mL/min
Glucose, Bld: 204 mg/dL — ABNORMAL HIGH (ref 70–99)
Potassium: 4.2 meq/L (ref 3.5–5.1)
Sodium: 135 meq/L (ref 135–145)

## 2011-07-31 LAB — LIPID PANEL
Cholesterol: 135 mg/dL (ref 0–200)
Total CHOL/HDL Ratio: 4
VLDL: 56.2 mg/dL — ABNORMAL HIGH (ref 0.0–40.0)

## 2011-07-31 LAB — HEPATIC FUNCTION PANEL
ALT: 32 U/L (ref 0–35)
AST: 34 U/L (ref 0–37)
Albumin: 3.9 g/dL (ref 3.5–5.2)
Alkaline Phosphatase: 49 U/L (ref 39–117)
Bilirubin, Direct: 0 mg/dL (ref 0.0–0.3)
Total Bilirubin: 0.3 mg/dL (ref 0.3–1.2)
Total Protein: 7.4 g/dL (ref 6.0–8.3)

## 2011-07-31 LAB — HEMOGLOBIN A1C: Hgb A1c MFr Bld: 7.8 % — ABNORMAL HIGH (ref 4.6–6.5)

## 2011-07-31 LAB — LDL CHOLESTEROL, DIRECT: Direct LDL: 72.3 mg/dL

## 2011-07-31 LAB — MICROALBUMIN / CREATININE URINE RATIO: Microalb Creat Ratio: 10.4 mg/g (ref 0.0–30.0)

## 2011-08-02 MED ORDER — SITAGLIPTIN PHOS-METFORMIN HCL 50-1000 MG PO TABS: 1.0000 | ORAL_TABLET | Freq: Two times a day (BID) | ORAL | Status: DC

## 2011-08-04 ENCOUNTER — Other Ambulatory Visit: Payer: Self-pay | Admitting: Family Medicine

## 2011-08-04 MED ORDER — METOPROLOL TARTRATE 25 MG PO TABS: ORAL_TABLET | ORAL | Status: DC

## 2011-08-04 MED ORDER — LISINOPRIL 5 MG PO TABS: ORAL_TABLET | ORAL | Status: DC

## 2011-08-04 NOTE — Telephone Encounter (Signed)
Rx sent 

## 2011-08-07 ENCOUNTER — Other Ambulatory Visit: Payer: 59

## 2011-08-16 ENCOUNTER — Other Ambulatory Visit: Payer: Self-pay | Admitting: Family Medicine

## 2011-08-16 MED ORDER — FENOFIBRATE 160 MG PO TABS: 160.0000 mg | ORAL_TABLET | Freq: Every day | ORAL | Status: DC

## 2011-08-16 MED ORDER — ROSUVASTATIN CALCIUM 20 MG PO TABS: 20.0000 mg | ORAL_TABLET | Freq: Every day | ORAL | Status: DC

## 2011-08-16 NOTE — Telephone Encounter (Signed)
Labs current--Rx faxed     KP     

## 2011-09-07 ENCOUNTER — Telehealth: Payer: Self-pay | Admitting: Family Medicine

## 2011-09-07 ENCOUNTER — Other Ambulatory Visit: Payer: Self-pay | Admitting: Family Medicine

## 2011-09-07 MED ORDER — METOPROLOL TARTRATE 25 MG PO TABS: ORAL_TABLET | ORAL | Status: DC

## 2011-09-07 NOTE — Telephone Encounter (Signed)
Refill: Metoprolol tartrate 25 mg tab. Take 1/2 tablet by mouth twice a day. Qty 30. Last fill 08-04-11

## 2011-10-08 ENCOUNTER — Other Ambulatory Visit: Payer: Self-pay | Admitting: Family Medicine

## 2011-10-16 ENCOUNTER — Telehealth: Payer: Self-pay | Admitting: Family Medicine

## 2011-10-16 MED ORDER — LISINOPRIL 5 MG PO TABS: 5.0000 mg | ORAL_TABLET | Freq: Every day | ORAL | Status: DC

## 2011-10-16 NOTE — Telephone Encounter (Signed)
Refill: Lisinopril 5 mg tablet #30. Take 1 tablet by mouth every day. ** Needs office visit for refills**

## 2011-10-18 ENCOUNTER — Ambulatory Visit (INDEPENDENT_AMBULATORY_CARE_PROVIDER_SITE_OTHER): Payer: Medicare Other | Admitting: Family Medicine

## 2011-10-18 ENCOUNTER — Encounter: Payer: Self-pay | Admitting: Family Medicine

## 2011-10-18 VITALS — BP 118/72 | HR 68 | Temp 98.3°F | Wt 269.0 lb

## 2011-10-18 DIAGNOSIS — L708 Other acne: Secondary | ICD-10-CM

## 2011-10-18 DIAGNOSIS — L700 Acne vulgaris: Secondary | ICD-10-CM

## 2011-10-18 NOTE — Progress Notes (Signed)
  Subjective:    Patient ID: Heidi Stark, female    DOB: [birth year], 71 y.o.   MRN: [id number]  HPI Pt here c/o small lump on back that has only been there a few weeks.  No pain --no other symptoms.      Review of Systems As above    Objective:   Physical Exam  Skin:             Assessment & Plan:  I&D small open comedone---- bandaid but in place. Pt tolerated well

## 2011-11-01 ENCOUNTER — Other Ambulatory Visit: Payer: Self-pay | Admitting: Family Medicine

## 2011-11-01 NOTE — Telephone Encounter (Signed)
Refill done.  

## 2011-11-06 ENCOUNTER — Other Ambulatory Visit: Payer: Self-pay | Admitting: Family Medicine

## 2011-11-08 ENCOUNTER — Other Ambulatory Visit: Payer: Self-pay | Admitting: Family Medicine

## 2011-11-29 ENCOUNTER — Ambulatory Visit (INDEPENDENT_AMBULATORY_CARE_PROVIDER_SITE_OTHER): Payer: Medicare Other | Admitting: Family Medicine

## 2011-11-29 ENCOUNTER — Encounter: Payer: Self-pay | Admitting: Family Medicine

## 2011-11-29 VITALS — BP 120/74 | HR 68 | Temp 98.5°F | Ht 65.0 in | Wt 270.2 lb

## 2011-11-29 DIAGNOSIS — Z0000 Encounter for general adult medical examination without abnormal findings: Secondary | ICD-10-CM

## 2011-11-29 DIAGNOSIS — J449 Chronic obstructive pulmonary disease, unspecified: Secondary | ICD-10-CM

## 2011-11-29 DIAGNOSIS — E785 Hyperlipidemia, unspecified: Secondary | ICD-10-CM

## 2011-11-29 DIAGNOSIS — E1151 Type 2 diabetes mellitus with diabetic peripheral angiopathy without gangrene: Secondary | ICD-10-CM

## 2011-11-29 DIAGNOSIS — I10 Essential (primary) hypertension: Secondary | ICD-10-CM

## 2011-11-29 DIAGNOSIS — Z23 Encounter for immunization: Secondary | ICD-10-CM

## 2011-11-29 DIAGNOSIS — I798 Other disorders of arteries, arterioles and capillaries in diseases classified elsewhere: Secondary | ICD-10-CM

## 2011-11-29 DIAGNOSIS — E1159 Type 2 diabetes mellitus with other circulatory complications: Secondary | ICD-10-CM

## 2011-11-29 NOTE — Patient Instructions (Signed)
Preventive Care for Adults, Female A healthy lifestyle and preventive care can promote health and wellness. Preventive health guidelines for women include the following key practices.  A routine yearly physical is a good way to check with your caregiver about your health and preventive screening. It is a chance to share any concerns and updates on your health, and to receive a thorough exam.   Visit your dentist for a routine exam and preventive care every 6 months. Brush your teeth twice a day and floss once a day. Good oral hygiene prevents tooth decay and gum disease.   The frequency of eye exams is based on your age, health, family medical history, use of contact lenses, and other factors. Follow your caregiver's recommendations for frequency of eye exams.   Eat a healthy diet. Foods like vegetables, fruits, whole grains, low-fat dairy products, and lean protein foods contain the nutrients you need without too many calories. Decrease your intake of foods high in solid fats, added sugars, and salt. Eat the right amount of calories for you.Get information about a proper diet from your caregiver, if necessary.   Regular physical exercise is one of the most important things you can do for your health. Most adults should get at least 150 minutes of moderate-intensity exercise (any activity that increases your heart rate and causes you to sweat) each week. In addition, most adults need muscle-strengthening exercises on 2 or more days a week.   Maintain a healthy weight. The body mass index (BMI) is a screening tool to identify possible weight problems. It provides an estimate of body fat based on height and weight. Your caregiver can help determine your BMI, and can help you achieve or maintain a healthy weight.For adults 20 years and older:   A BMI below 18.5 is considered underweight.   A BMI of 18.5 to 24.9 is normal.   A BMI of 25 to 29.9 is considered overweight.   A BMI of 30 and above is  considered obese.   Maintain normal blood lipids and cholesterol levels by exercising and minimizing your intake of saturated fat. Eat a balanced diet with plenty of fruit and vegetables. Blood tests for lipids and cholesterol should begin at age 20 and be repeated every 5 years. If your lipid or cholesterol levels are high, you are over 50, or you are at high risk for heart disease, you may need your cholesterol levels checked more frequently.Ongoing high lipid and cholesterol levels should be treated with medicines if diet and exercise are not effective.   If you smoke, find out from your caregiver how to quit. If you do not use tobacco, do not start.   If you are pregnant, do not drink alcohol. If you are breastfeeding, be very cautious about drinking alcohol. If you are not pregnant and choose to drink alcohol, do not exceed 1 drink per day. One drink is considered to be 12 ounces (355 mL) of beer, 5 ounces (148 mL) of wine, or 1.5 ounces (44 mL) of liquor.   Avoid use of street drugs. Do not share needles with anyone. Ask for help if you need support or instructions about stopping the use of drugs.   High blood pressure causes heart disease and increases the risk of stroke. Your blood pressure should be checked at least every 1 to 2 years. Ongoing high blood pressure should be treated with medicines if weight loss and exercise are not effective.   If you are 55 to 70   years old, ask your caregiver if you should take aspirin to prevent strokes.   Diabetes screening involves taking a blood sample to check your fasting blood sugar level. This should be done once every 3 years, after age 45, if you are within normal weight and without risk factors for diabetes. Testing should be considered at a younger age or be carried out more frequently if you are overweight and have at least 1 risk factor for diabetes.   Breast cancer screening is essential preventive care for women. You should practice "breast  self-awareness." This means understanding the normal appearance and feel of your breasts and may include breast self-examination. Any changes detected, no matter how small, should be reported to a caregiver. Women in their 20s and 30s should have a clinical breast exam (CBE) by a caregiver as part of a regular health exam every 1 to 3 years. After age 40, women should have a CBE every year. Starting at age 40, women should consider having a mammography (breast X-ray test) every year. Women who have a family history of breast cancer should talk to their caregiver about genetic screening. Women at a high risk of breast cancer should talk to their caregivers about having magnetic resonance imaging (MRI) and a mammography every year.   The Pap test is a screening test for cervical cancer. A Pap test can show cell changes on the cervix that might become cervical cancer if left untreated. A Pap test is a procedure in which cells are obtained and examined from the lower end of the uterus (cervix).   Women should have a Pap test starting at age 21.   Between ages 21 and 29, Pap tests should be repeated every 2 years.   Beginning at age 30, you should have a Pap test every 3 years as long as the past 3 Pap tests have been normal.   Some women have medical problems that increase the chance of getting cervical cancer. Talk to your caregiver about these problems. It is especially important to talk to your caregiver if a new problem develops soon after your last Pap test. In these cases, your caregiver may recommend more frequent screening and Pap tests.   The above recommendations are the same for women who have or have not gotten the vaccine for human papillomavirus (HPV).   If you had a hysterectomy for a problem that was not cancer or a condition that could lead to cancer, then you no longer need Pap tests. Even if you no longer need a Pap test, a regular exam is a good idea to make sure no other problems are  starting.   If you are between ages 65 and 70, and you have had normal Pap tests going back 10 years, you no longer need Pap tests. Even if you no longer need a Pap test, a regular exam is a good idea to make sure no other problems are starting.   If you have had past treatment for cervical cancer or a condition that could lead to cancer, you need Pap tests and screening for cancer for at least 20 years after your treatment.   If Pap tests have been discontinued, risk factors (such as a new sexual partner) need to be reassessed to determine if screening should be resumed.   The HPV test is an additional test that may be used for cervical cancer screening. The HPV test looks for the virus that can cause the cell changes on the cervix.   The cells collected during the Pap test can be tested for HPV. The HPV test could be used to screen women aged 30 years and older, and should be used in women of any age who have unclear Pap test results. After the age of 30, women should have HPV testing at the same frequency as a Pap test.   Colorectal cancer can be detected and often prevented. Most routine colorectal cancer screening begins at the age of 50 and continues through age 75. However, your caregiver may recommend screening at an earlier age if you have risk factors for colon cancer. On a yearly basis, your caregiver may provide home test kits to check for hidden blood in the stool. Use of a small camera at the end of a tube, to directly examine the colon (sigmoidoscopy or colonoscopy), can detect the earliest forms of colorectal cancer. Talk to your caregiver about this at age 50, when routine screening begins. Direct examination of the colon should be repeated every 5 to 10 years through age 75, unless early forms of pre-cancerous polyps or small growths are found.   Hepatitis C blood testing is recommended for all people born from 1945 through 1965 and any individual with known risks for hepatitis C.    Practice safe sex. Use condoms and avoid high-risk sexual practices to reduce the spread of sexually transmitted infections (STIs). STIs include gonorrhea, chlamydia, syphilis, trichomonas, herpes, HPV, and human immunodeficiency virus (HIV). Herpes, HIV, and HPV are viral illnesses that have no cure. They can result in disability, cancer, and death. Sexually active women aged 25 and younger should be checked for chlamydia. Older women with new or multiple partners should also be tested for chlamydia. Testing for other STIs is recommended if you are sexually active and at increased risk.   Osteoporosis is a disease in which the bones lose minerals and strength with aging. This can result in serious bone fractures. The risk of osteoporosis can be identified using a bone density scan. Women ages 65 and over and women at risk for fractures or osteoporosis should discuss screening with their caregivers. Ask your caregiver whether you should take a calcium supplement or vitamin D to reduce the rate of osteoporosis.   Menopause can be associated with physical symptoms and risks. Hormone replacement therapy is available to decrease symptoms and risks. You should talk to your caregiver about whether hormone replacement therapy is right for you.   Use sunscreen with sun protection factor (SPF) of 30 or more. Apply sunscreen liberally and repeatedly throughout the day. You should seek shade when your shadow is shorter than you. Protect yourself by wearing long sleeves, pants, a wide-brimmed hat, and sunglasses year round, whenever you are outdoors.   Once a month, do a whole body skin exam, using a mirror to look at the skin on your back. Notify your caregiver of new moles, moles that have irregular borders, moles that are larger than a pencil eraser, or moles that have changed in shape or color.   Stay current with required immunizations.   Influenza. You need a dose every fall (or winter). The composition of  the flu vaccine changes each year, so being vaccinated once is not enough.   Pneumococcal polysaccharide. You need 1 to 2 doses if you smoke cigarettes or if you have certain chronic medical conditions. You need 1 dose at age 65 (or older) if you have never been vaccinated.   Tetanus, diphtheria, pertussis (Tdap, Td). Get 1 dose of   Tdap vaccine if you are younger than age 65, are over 65 and have contact with an infant, are a healthcare worker, are pregnant, or simply want to be protected from whooping cough. After that, you need a Td booster dose every 10 years. Consult your caregiver if you have not had at least 3 tetanus and diphtheria-containing shots sometime in your life or have a deep or dirty wound.   HPV. You need this vaccine if you are a woman age 26 or younger. The vaccine is given in 3 doses over 6 months.   Measles, mumps, rubella (MMR). You need at least 1 dose of MMR if you were born in 1957 or later. You may also need a second dose.   Meningococcal. If you are age 19 to 21 and a first-year college student living in a residence hall, or have one of several medical conditions, you need to get vaccinated against meningococcal disease. You may also need additional booster doses.   Zoster (shingles). If you are age 60 or older, you should get this vaccine.   Varicella (chickenpox). If you have never had chickenpox or you were vaccinated but received only 1 dose, talk to your caregiver to find out if you need this vaccine.   Hepatitis A. You need this vaccine if you have a specific risk factor for hepatitis A virus infection or you simply wish to be protected from this disease. The vaccine is usually given as 2 doses, 6 to 18 months apart.   Hepatitis B. You need this vaccine if you have a specific risk factor for hepatitis B virus infection or you simply wish to be protected from this disease. The vaccine is given in 3 doses, usually over 6 months.  Preventive Services /  Frequency Ages 19 to 39  Blood pressure check.** / Every 1 to 2 years.   Lipid and cholesterol check.** / Every 5 years beginning at age 20.   Clinical breast exam.** / Every 3 years for women in their 20s and 30s.   Pap test.** / Every 2 years from ages 21 through 29. Every 3 years starting at age 30 through age 65 or 70 with a history of 3 consecutive normal Pap tests.   HPV screening.** / Every 3 years from ages 30 through ages 65 to 70 with a history of 3 consecutive normal Pap tests.   Hepatitis C blood test.** / For any individual with known risks for hepatitis C.   Skin self-exam. / Monthly.   Influenza immunization.** / Every year.   Pneumococcal polysaccharide immunization.** / 1 to 2 doses if you smoke cigarettes or if you have certain chronic medical conditions.   Tetanus, diphtheria, pertussis (Tdap, Td) immunization. / A one-time dose of Tdap vaccine. After that, you need a Td booster dose every 10 years.   HPV immunization. / 3 doses over 6 months, if you are 26 and younger.   Measles, mumps, rubella (MMR) immunization. / You need at least 1 dose of MMR if you were born in 1957 or later. You may also need a second dose.   Meningococcal immunization. / 1 dose if you are age 19 to 21 and a first-year college student living in a residence hall, or have one of several medical conditions, you need to get vaccinated against meningococcal disease. You may also need additional booster doses.   Varicella immunization.** / Consult your caregiver.   Hepatitis A immunization.** / Consult your caregiver. 2 doses, 6 to 18 months   apart.   Hepatitis B immunization.** / Consult your caregiver. 3 doses usually over 6 months.  Ages 40 to 64  Blood pressure check.** / Every 1 to 2 years.   Lipid and cholesterol check.** / Every 5 years beginning at age 20.   Clinical breast exam.** / Every year after age 40.   Mammogram.** / Every year beginning at age 40 and continuing for as  long as you are in good health. Consult with your caregiver.   Pap test.** / Every 3 years starting at age 30 through age 65 or 70 with a history of 3 consecutive normal Pap tests.   HPV screening.** / Every 3 years from ages 30 through ages 65 to 70 with a history of 3 consecutive normal Pap tests.   Fecal occult blood test (FOBT) of stool. / Every year beginning at age 50 and continuing until age 75. You may not need to do this test if you get a colonoscopy every 10 years.   Flexible sigmoidoscopy or colonoscopy.** / Every 5 years for a flexible sigmoidoscopy or every 10 years for a colonoscopy beginning at age 50 and continuing until age 75.   Hepatitis C blood test.** / For all people born from 1945 through 1965 and any individual with known risks for hepatitis C.   Skin self-exam. / Monthly.   Influenza immunization.** / Every year.   Pneumococcal polysaccharide immunization.** / 1 to 2 doses if you smoke cigarettes or if you have certain chronic medical conditions.   Tetanus, diphtheria, pertussis (Tdap, Td) immunization.** / A one-time dose of Tdap vaccine. After that, you need a Td booster dose every 10 years.   Measles, mumps, rubella (MMR) immunization. / You need at least 1 dose of MMR if you were born in 1957 or later. You may also need a second dose.   Varicella immunization.** / Consult your caregiver.   Meningococcal immunization.** / Consult your caregiver.   Hepatitis A immunization.** / Consult your caregiver. 2 doses, 6 to 18 months apart.   Hepatitis B immunization.** / Consult your caregiver. 3 doses, usually over 6 months.  Ages 65 and over  Blood pressure check.** / Every 1 to 2 years.   Lipid and cholesterol check.** / Every 5 years beginning at age 20.   Clinical breast exam.** / Every year after age 40.   Mammogram.** / Every year beginning at age 40 and continuing for as long as you are in good health. Consult with your caregiver.   Pap test.** /  Every 3 years starting at age 30 through age 65 or 70 with a 3 consecutive normal Pap tests. Testing can be stopped between 65 and 70 with 3 consecutive normal Pap tests and no abnormal Pap or HPV tests in the past 10 years.   HPV screening.** / Every 3 years from ages 30 through ages 65 or 70 with a history of 3 consecutive normal Pap tests. Testing can be stopped between 65 and 70 with 3 consecutive normal Pap tests and no abnormal Pap or HPV tests in the past 10 years.   Fecal occult blood test (FOBT) of stool. / Every year beginning at age 50 and continuing until age 75. You may not need to do this test if you get a colonoscopy every 10 years.   Flexible sigmoidoscopy or colonoscopy.** / Every 5 years for a flexible sigmoidoscopy or every 10 years for a colonoscopy beginning at age 50 and continuing until age 75.   Hepatitis   C blood test.** / For all people born from 1945 through 1965 and any individual with known risks for hepatitis C.   Osteoporosis screening.** / A one-time screening for women ages 65 and over and women at risk for fractures or osteoporosis.   Skin self-exam. / Monthly.   Influenza immunization.** / Every year.   Pneumococcal polysaccharide immunization.** / 1 dose at age 65 (or older) if you have never been vaccinated.   Tetanus, diphtheria, pertussis (Tdap, Td) immunization. / A one-time dose of Tdap vaccine if you are over 65 and have contact with an infant, are a healthcare worker, or simply want to be protected from whooping cough. After that, you need a Td booster dose every 10 years.   Varicella immunization.** / Consult your caregiver.   Meningococcal immunization.** / Consult your caregiver.   Hepatitis A immunization.** / Consult your caregiver. 2 doses, 6 to 18 months apart.   Hepatitis B immunization.** / Check with your caregiver. 3 doses, usually over 6 months.  ** Family history and personal history of risk and conditions may change your caregiver's  recommendations. Document Released: 08/29/2001 Document Revised: 06/22/2011 Document Reviewed: 11/28/2010 [employer] Patient Information 2012 [employer]. 

## 2011-11-29 NOTE — Assessment & Plan Note (Signed)
stable °

## 2011-11-29 NOTE — Progress Notes (Signed)
Subjective:    Heidi Stark is a 71 y.o. female who presents for Medicare Annual/Subsequent preventive examination.  Preventive Screening-Counseling & Management  Tobacco History  Smoking status  . Current Some Day Smoker  . Types: Cigarettes  . Last Attempt to Quit: 07/17/2005  Smokeless tobacco  . Never Used     Problems Prior to Visit 1. none  Current Problems (verified) Patient Active Problem List  Diagnoses  . DIABETES MELLITUS, TYPE II  . HYPERLIPIDEMIA  . OBESITY  . OBSTRUCTIVE SLEEP APNEA  . HYPERTENSION  . PERIPHERAL VASCULAR DISEASE  . SINUSITIS - ACUTE-NOS  . CHRONIC OBSTRUCTIVE PULMONARY DISEASE, ACUTE EXACERBATION  . COPD  . PULMONARY NODULE, RIGHT MIDDLE LOBE  . MYALGIA  . COLONIC POLYPS  . PVD WITH CLAUDICATION    Medications Prior to Visit Current Outpatient Prescriptions on File Prior to Visit  Medication Sig Dispense Refill  . albuterol (PROVENTIL) (2.5 MG/3ML) 0.083% nebulizer solution Take 3 mLs (2.5 mg total) by nebulization 4 (four) times daily as needed for wheezing.  75 mL  2  . aspirin 81 MG tablet Take 81 mg by mouth daily.        . fenofibrate 160 MG tablet Take 1 tablet (160 mg total) by mouth daily.  30 tablet  3  . Fluticasone-Salmeterol (ADVAIR DISKUS) 250-50 MCG/DOSE AEPB Inhale 1 puff into the lungs every 12 (twelve) hours.  60 each  2  . furosemide (LASIX) 20 MG tablet Take 1 tablet (20 mg total) by mouth 2 (two) times daily.  180 tablet  2  . glimepiride (AMARYL) 4 MG tablet TAKE 1 TABLET BY MOUTH TWICE A DAY  180 tablet  1  . glucose blood test strip 1 each by Other route as needed. Use as instructed       . JANUMET 50-1000 MG per tablet TAKE 1 TABLET BY MOUTH 2 (TWO) TIMES DAILY WITH A MEAL.  60 tablet  2  . KLOR-CON 10 10 MEQ CR tablet TAKE 1 TABLET BY MOUTH TWICE A DAY  180 tablet  2  . lisinopril (PRINIVIL,ZESTRIL) 5 MG tablet TAKE 1 TABLET BY MOUTH ONCE A DAY  30 tablet  2  . metoprolol tartrate (LOPRESSOR) 25 MG tablet  TAKE ONE-HALF TABLET BY MOUTH TWO TIMES A DAY  30 tablet  2  . rosuvastatin (CRESTOR) 20 MG tablet Take 1 tablet (20 mg total) by mouth at bedtime.  30 tablet  3  . fluticasone (FLONASE) 50 MCG/ACT nasal spray 2 sprays by Nasal route daily.  16 g  2    Current Medications (verified) Current Outpatient Prescriptions  Medication Sig Dispense Refill  . albuterol (PROVENTIL) (2.5 MG/3ML) 0.083% nebulizer solution Take 3 mLs (2.5 mg total) by nebulization 4 (four) times daily as needed for wheezing.  75 mL  2  . aspirin 81 MG tablet Take 81 mg by mouth daily.        . fenofibrate 160 MG tablet Take 1 tablet (160 mg total) by mouth daily.  30 tablet  3  . Fluticasone-Salmeterol (ADVAIR DISKUS) 250-50 MCG/DOSE AEPB Inhale 1 puff into the lungs every 12 (twelve) hours.  60 each  2  . furosemide (LASIX) 20 MG tablet Take 1 tablet (20 mg total) by mouth 2 (two) times daily.  180 tablet  2  . glimepiride (AMARYL) 4 MG tablet TAKE 1 TABLET BY MOUTH TWICE A DAY  180 tablet  1  . glucose blood test strip 1 each by Other route as  needed. Use as instructed       . JANUMET 50-1000 MG per tablet TAKE 1 TABLET BY MOUTH 2 (TWO) TIMES DAILY WITH A MEAL.  60 tablet  2  . KLOR-CON 10 10 MEQ CR tablet TAKE 1 TABLET BY MOUTH TWICE A DAY  180 tablet  2  . lisinopril (PRINIVIL,ZESTRIL) 5 MG tablet TAKE 1 TABLET BY MOUTH ONCE A DAY  30 tablet  2  . metoprolol tartrate (LOPRESSOR) 25 MG tablet TAKE ONE-HALF TABLET BY MOUTH TWO TIMES A DAY  30 tablet  2  . rosuvastatin (CRESTOR) 20 MG tablet Take 1 tablet (20 mg total) by mouth at bedtime.  30 tablet  3  . fluticasone (FLONASE) 50 MCG/ACT nasal spray 2 sprays by Nasal route daily.  16 g  2     Allergies (verified) Neomycin-bacitracin zn-polymyx and Sulfonamide derivatives   PAST HISTORY  Family History Family History  Problem Relation Age of Onset  . Diabetes    . Melanoma      Social History History  Substance Use Topics  . Smoking status: Current Some Day  Smoker    Types: Cigarettes    Last Attempt to Quit: 07/17/2005  . Smokeless tobacco: Never Used  . Alcohol Use: No     Are there smokers in your home (other than you)? No  Risk Factors Current exercise habits: The patient does not participate in regular exercise at present.  Dietary issues discussed: na   Cardiac risk factors: advanced age (older than 37 for men, 40 for women), diabetes mellitus, dyslipidemia, hypertension, obesity (BMI >= 30 kg/m2) and sedentary lifestyle.  Depression Screen (Note: if answer to either of the following is "Yes", a more complete depression screening is indicated)   Over the past two weeks, have you felt down, depressed or hopeless? No  Over the past two weeks, have you felt little interest or pleasure in doing things? No  Have you lost interest or pleasure in daily life? No  Do you often feel hopeless? No  Do you cry easily over simple problems? No  Activities of Daily Living In your present state of health, do you have any difficulty performing the following activities?:  Driving? No Managing money?  No Feeding yourself? No Getting from bed to chair? No Climbing a flight of stairs? Yes Preparing food and eating?: No Bathing or showering? No Getting dressed: No Getting to the toilet? No Using the toilet:No Moving around from place to place: No In the past year have you fallen or had a near fall?:No   Are you sexually active?  No  Do you have more than one partner?  No  Hearing Difficulties: No Do you often ask people to speak up or repeat themselves? No Do you experience ringing or noises in your ears? No Do you have difficulty understanding soft or whispered voices? No   Do you feel that you have a problem with memory? No  Do you often misplace items? No  Do you feel safe at home?  Yes  Cognitive Testing  Alert? Yes  Normal Appearance?Yes  Oriented to person? Yes  Place? Yes   Time? Yes  Recall of three objects?  Yes  Can  perform simple calculations? Yes  Displays appropriate judgment?Yes  Can read the correct time from a watch face?Yes   Advanced Directives have been discussed with the patient? Yes  List the Names of Other Physician/Practitioners you currently use: 1.  opth-mcquin 2.  Gyn--[facility]good  Indicate any  recent Medical Services you may have received from other than [facility] providers in the past year (date may be approximate).  Immunization History  Administered Date(s) Administered  . Influenza Whole 04/28/2009, 04/27/2010  . Pneumococcal Polysaccharide 07/17/2002  . Td 07/17/2002  . Zoster 11/11/2008    Screening Tests Health Maintenance  Topic Date Due  . Mammogram  01/07/1992  . Pneumococcal Polysaccharide Vaccine Age 72 And Over  01/07/2007  . Colonoscopy  11/21/2011  . Influenza Vaccine  04/16/2012  . Tetanus/tdap  07/17/2012  . Zostavax  Completed    All answers were reviewed with the patient and necessary referrals were made:  Loreen Freud, DO   11/29/2011   History reviewed: allergies, current medications, past family history, past medical history, past social history, past surgical history and problem list  Review of Systems  Review of Systems  Constitutional: Negative for activity change, appetite change and fatigue.  HENT: Negative for hearing loss, congestion, tinnitus and ear discharge.   Eyes: Negative for visual disturbance (see optho q1y -- vision corrected to 20/20 with glasses).  Respiratory: Negative for cough, chest tightness and shortness of breath.   Cardiovascular: Negative for chest pain, palpitations and leg swelling.  Gastrointestinal: Negative for abdominal pain, diarrhea, constipation and abdominal distention.  Genitourinary: Negative for urgency, frequency, decreased urine volume and difficulty urinating.  Musculoskeletal: Negative for back pain, knee pain secondary arthritis Skin: Negative for color change, pallor and rash.  Neurological: Negative for  dizziness, light-headedness, numbness and headaches.  Hematological: Negative for adenopathy. Does not bruise/bleed easily.  Psychiatric/Behavioral: Negative for suicidal ideas, confusion, sleep disturbance, self-injury, dysphoric mood, decreased concentration and agitation.  Pt is able to read and write and can do all ADLs No risk for falling No abuse/ violence in home     Objective:     Vision by Snellen chart: opth  Body mass index is 44.96 kg/(m^2). BP 120/74  Pulse 68  Temp(Src) 98.5 F (36.9 C) (Oral)  Ht 5\' 5"  (1.651 m)  Wt 270 lb 3.2 oz (122.562 kg)  BMI 44.96 kg/m2  SpO2 95%  BP 120/74  Pulse 68  Temp(Src) 98.5 F (36.9 C) (Oral)  Ht 5\' 5"  (1.651 m)  Wt 270 lb 3.2 oz (122.562 kg)  BMI 44.96 kg/m2  SpO2 95% General appearance: alert, cooperative, appears stated age, no distress and morbidly obese Head: Normocephalic, without obvious abnormality, atraumatic Eyes: conjunctivae/corneas clear. PERRL, EOM's intact. Fundi benign. Ears: normal TM's and external ear canals both ears Nose: Nares normal. Septum midline. Mucosa normal. No drainage or sinus tenderness. Throat: lips, mucosa, and tongue normal; teeth and gums normal Neck: no adenopathy, no carotid bruit, no JVD, supple, symmetrical, trachea midline and thyroid not enlarged, symmetric, no tenderness/mass/nodules Back: symmetric, no curvature. ROM normal. No CVA tenderness. Lungs: clear to auscultation bilaterally Breasts: gyn Heart: S1, S2 normal  + murmur 2/6 Abdomen: soft, non-tender; bowel sounds normal; no masses,  no organomegaly Pelvic: gyn Extremities: extremities normal, atraumatic, no cyanosis or edema Pulses: 2+ and symmetric Skin: Skin color, texture, turgor normal. No rashes or lesions Lymph nodes: Cervical, supraclavicular, and axillary nodes normal. Neurologic: Alert and oriented X 3, normal strength and tone. Normal symmetric reflexes. Normal coordination and gait Psych--no depression/  anxiety  Sensory exam of the foot is normal, tested with the monofilament. Good pulses, no lesions or ulcers, good peripheral pulses.   Assessment:     cpe     Plan:     During the course of the visit  the patient was educated and counseled about appropriate screening and preventive services including:    Pneumococcal vaccine   Influenza vaccine  Screening mammography  Screening Pap smear and pelvic exam   Bone densitometry screening  Colorectal cancer screening  Diabetes screening  Advanced directives: has NO advanced directive  - add't info requested. Referral to SW: number given  Diet review for nutrition referral? Yes ____  Not Indicated ____   Patient Instructions (the written plan) was given to the patient.  Medicare Attestation I have personally reviewed: The patient's medical and social history Their use of alcohol, tobacco or illicit drugs Their current medications and supplements The patient's functional ability including ADLs,fall risks, home safety risks, cognitive, and hearing and visual impairment Diet and physical activities Evidence for depression or mood disorders  The patient's weight, height, BMI, and visual acuity have been recorded in the chart.  I have made referrals, counseling, and provided education to the patient based on review of the above and I have provided the patient with a written personalized care plan for preventive services.     Loreen Freud, DO   11/29/2011

## 2011-11-30 ENCOUNTER — Other Ambulatory Visit: Payer: Self-pay | Admitting: Family Medicine

## 2011-11-30 ENCOUNTER — Other Ambulatory Visit (INDEPENDENT_AMBULATORY_CARE_PROVIDER_SITE_OTHER): Payer: Medicare Other

## 2011-11-30 ENCOUNTER — Encounter: Payer: Self-pay | Admitting: Internal Medicine

## 2011-11-30 DIAGNOSIS — IMO0001 Reserved for inherently not codable concepts without codable children: Secondary | ICD-10-CM

## 2011-11-30 DIAGNOSIS — E1151 Type 2 diabetes mellitus with diabetic peripheral angiopathy without gangrene: Secondary | ICD-10-CM

## 2011-11-30 DIAGNOSIS — E1159 Type 2 diabetes mellitus with other circulatory complications: Secondary | ICD-10-CM

## 2011-11-30 DIAGNOSIS — I10 Essential (primary) hypertension: Secondary | ICD-10-CM

## 2011-11-30 DIAGNOSIS — I798 Other disorders of arteries, arterioles and capillaries in diseases classified elsewhere: Secondary | ICD-10-CM

## 2011-11-30 DIAGNOSIS — E785 Hyperlipidemia, unspecified: Secondary | ICD-10-CM

## 2011-11-30 LAB — LIPID PANEL
HDL: 30.8 mg/dL — ABNORMAL LOW (ref >39.00)
Triglycerides: 246 mg/dL — ABNORMAL HIGH (ref 0.0–149.0)
VLDL: 49.2 mg/dL — ABNORMAL HIGH (ref 0.0–40.0)

## 2011-11-30 LAB — HEPATIC FUNCTION PANEL
ALT: 32 U/L (ref 0–35)
Total Protein: 7.1 g/dL (ref 6.0–8.3)

## 2011-11-30 LAB — URINALYSIS, ROUTINE W REFLEX MICROSCOPIC
Hgb urine dipstick: NEGATIVE
Nitrite: NEGATIVE
Specific Gravity, Urine: 1.02 (ref 1.000–1.030)

## 2011-11-30 LAB — CBC WITH DIFFERENTIAL/PLATELET
Basophils Relative: 0.3 % (ref 0.0–3.0)
Eosinophils Relative: 2 % (ref 0.0–5.0)
HCT: 44 % (ref 36.0–46.0)
Hemoglobin: 14.4 g/dL (ref 12.0–15.0)
Lymphs Abs: 2.3 10*3/uL (ref 0.7–4.0)
Monocytes Relative: 9.3 % (ref 3.0–12.0)
Neutro Abs: 4 10*3/uL (ref 1.4–7.7)
RBC: 4.84 Mil/uL (ref 3.87–5.11)
WBC: 7.1 10*3/uL (ref 4.5–10.5)

## 2011-11-30 LAB — BASIC METABOLIC PANEL
Chloride: 102 mEq/L (ref 96–112)
GFR: 126.75 mL/min (ref >60.00)
Potassium: 3.5 mEq/L (ref 3.5–5.1)
Sodium: 138 mEq/L (ref 135–145)

## 2011-11-30 LAB — MICROALBUMIN / CREATININE URINE RATIO: Microalb, Ur: 9.9 mg/dL — ABNORMAL HIGH (ref 0.0–1.9)

## 2011-11-30 LAB — HEMOGLOBIN A1C: Hgb A1c MFr Bld: 7.7 % — ABNORMAL HIGH (ref 4.6–6.5)

## 2011-11-30 NOTE — Progress Notes (Signed)
Labs only

## 2011-12-07 ENCOUNTER — Telehealth: Payer: Self-pay | Admitting: Family Medicine

## 2011-12-07 MED ORDER — ROSUVASTATIN CALCIUM 20 MG PO TABS: 20.0000 mg | ORAL_TABLET | Freq: Every day | ORAL | Status: DC

## 2011-12-07 NOTE — Telephone Encounter (Signed)
Refill: Crestor 20mg  tablet. Take 1 tablet by mouth at bedtime. Qty 30. Last fill 11-08-11

## 2011-12-07 NOTE — Telephone Encounter (Signed)
Rx sent 

## 2011-12-19 ENCOUNTER — Other Ambulatory Visit: Payer: Self-pay | Admitting: Family Medicine

## 2011-12-19 MED ORDER — FENOFIBRATE 160 MG PO TABS: 160.0000 mg | ORAL_TABLET | Freq: Every day | ORAL | Status: DC

## 2011-12-19 NOTE — Telephone Encounter (Signed)
refill fenofibrate 160mg  tablet Qty 30  Take one tablet by mouth every day  Last filled 5.3.13 Last ov 5.15.13

## 2011-12-19 NOTE — Telephone Encounter (Deleted)
Received a 2nd request for this medication 6.3.13

## 2011-12-19 NOTE — Telephone Encounter (Signed)
Error, wrong medication to add.

## 2011-12-25 ENCOUNTER — Telehealth: Payer: Self-pay | Admitting: *Deleted

## 2011-12-25 NOTE — Telephone Encounter (Signed)
PATIENT COMING IN FOR PREVISIT ON Thursday 12/28/11. REPORT FROM LAST COLONOSCOPY ON 12/11/01 STATES SHE USED VISICOL PREP: FAIR,EXAM COMPROMISED. YOU WROTE REPEAT COLONOSCOPY IN 1 YEAR WITH MORE VIGOROUS PREPARATION. CHART ON YOUR DESK FOR REVIEW. WOULD YOU LIKE PATIENT TO HAVE ANY ADDITIONAL PREP WITH MOVIPREP? THANKS

## 2011-12-26 ENCOUNTER — Encounter: Payer: Self-pay | Admitting: Internal Medicine

## 2011-12-26 NOTE — Telephone Encounter (Signed)
Movi prep should be fine. She just needs to follow the instructions to the letter. If she has issues with nausea, you can prescribed Reglan 10 mg, dispense 2, take one 20 minutes before starting each prep session. Also, keep hard candy in her mouth while drinking the prep. This may help with the after taste. Thanks

## 2011-12-28 ENCOUNTER — Telehealth: Payer: Self-pay | Admitting: *Deleted

## 2011-12-28 ENCOUNTER — Encounter: Payer: Self-pay | Admitting: *Deleted

## 2011-12-28 NOTE — Telephone Encounter (Signed)
Spoke with spouse.  He said he would call her and give her the message that she has to [facility] by 5pm today or we have to CX her colon scheduled for 01/11/12.

## 2011-12-28 NOTE — Telephone Encounter (Signed)
Pt. NOS for Previsit

## 2011-12-28 NOTE — Telephone Encounter (Signed)
Pt did not [facility] previsit.  Procedure cancelled and no show letter sent.  [name]

## 2012-01-11 ENCOUNTER — Encounter: Payer: Medicare Other | Admitting: Internal Medicine

## 2012-01-25 ENCOUNTER — Telehealth: Payer: Self-pay | Admitting: Family Medicine

## 2012-01-25 NOTE — Telephone Encounter (Signed)
We can give her samples

## 2012-01-25 NOTE — Telephone Encounter (Signed)
Patient aware sample ready for pick up.     KP 

## 2012-01-25 NOTE — Telephone Encounter (Signed)
Patient is is going out of town and she needs samples of Janumet because it would be too soon to fill her meds but she runs out while she is out of town. Please advise if it is okay to give samples.    KP

## 2012-01-29 ENCOUNTER — Other Ambulatory Visit: Payer: Self-pay | Admitting: Family Medicine

## 2012-02-05 ENCOUNTER — Telehealth: Payer: Self-pay | Admitting: Family Medicine

## 2012-02-05 NOTE — Telephone Encounter (Signed)
Please advise      KP 

## 2012-02-05 NOTE — Telephone Encounter (Signed)
msg left to call the office     KP 

## 2012-02-05 NOTE — Telephone Encounter (Signed)
We normally only give them to someone who has hot flashes they can not tolerate.  That is something she could discuss with Dr Pennie Rushing the next time she sees her.

## 2012-02-05 NOTE — Telephone Encounter (Signed)
Patient states she discussed taking estrogen at her last OV and never got a prescription. She would like someone to call her and let her know if she is supposed to be taking this. [facility] uses [facility] on [address]

## 2012-02-07 NOTE — Telephone Encounter (Signed)
msg left to call the office @ [phone] (Home)    KP

## 2012-02-13 ENCOUNTER — Encounter: Payer: Self-pay | Admitting: Family Medicine

## 2012-02-13 NOTE — Telephone Encounter (Signed)
Response sent Via mychart since I was unable to contact the patient.     KP

## 2012-02-16 ENCOUNTER — Other Ambulatory Visit: Payer: Self-pay | Admitting: Family Medicine

## 2012-02-16 MED ORDER — SITAGLIPTIN PHOS-METFORMIN HCL 50-1000 MG PO TABS: 1.0000 | ORAL_TABLET | Freq: Two times a day (BID) | ORAL | Status: DC

## 2012-02-16 NOTE — Telephone Encounter (Signed)
Refill JANUMET 50-1000 MG TAKE 1 TABLET BY MOUTH 2 (TWO) TIMES DAILY WITH A MEAL. Last fill 6.17.13 Last ov 5.15.13-CPE

## 2012-02-25 ENCOUNTER — Other Ambulatory Visit: Payer: Self-pay | Admitting: Family Medicine

## 2012-03-07 ENCOUNTER — Other Ambulatory Visit: Payer: Self-pay | Admitting: Family Medicine

## 2012-03-13 ENCOUNTER — Other Ambulatory Visit: Payer: Self-pay | Admitting: Family Medicine

## 2012-03-15 ENCOUNTER — Encounter: Payer: Self-pay | Admitting: Family Medicine

## 2012-03-15 ENCOUNTER — Ambulatory Visit (INDEPENDENT_AMBULATORY_CARE_PROVIDER_SITE_OTHER): Payer: Medicare Other | Admitting: Family Medicine

## 2012-03-15 VITALS — BP 146/74 | HR 75 | Temp 98.5°F | Wt 264.4 lb

## 2012-03-15 DIAGNOSIS — E119 Type 2 diabetes mellitus without complications: Secondary | ICD-10-CM

## 2012-03-15 DIAGNOSIS — R809 Proteinuria, unspecified: Secondary | ICD-10-CM

## 2012-03-15 DIAGNOSIS — R350 Frequency of micturition: Secondary | ICD-10-CM

## 2012-03-15 LAB — POCT URINALYSIS DIPSTICK
Blood, UA: NEGATIVE
Glucose, UA: 2000
Nitrite, UA: NEGATIVE
Urobilinogen, UA: 0.2

## 2012-03-15 LAB — GLUCOSE, POCT (MANUAL RESULT ENTRY): POC Glucose: 257 mg/dl — AB (ref 70–99)

## 2012-03-15 MED ORDER — ALOGLIPTIN-METFORMIN HCL 12.5-1000 MG PO TABS: 1.0000 | ORAL_TABLET | Freq: Two times a day (BID) | ORAL | Status: DC

## 2012-03-15 NOTE — Patient Instructions (Addendum)

## 2012-03-15 NOTE — Progress Notes (Signed)
  Subjective:    Heidi Stark is a 71 y.o. female who complains of burning with urination and frequency. She has had symptoms for 2 days. Patient also complains of none. Patient denies back pain, congestion, cough, fever, headache, rhinitis, sorethroat, stomach ache and vaginal discharge. Patient does not have a history of recurrent UTI. Patient does not have a history of pyelonephritis.   The following portions of the patient's history were reviewed and updated as appropriate: allergies, current medications, past family history, past medical history, past social history, past surgical history and problem list.  Review of Systems Pertinent items are noted in HPI.    Objective:    BP 146/74  Pulse 75  Temp 98.5 F (36.9 C) (Oral)  Wt 264 lb 6.4 oz (119.931 kg)  SpO2 95% General appearance: alert, cooperative, appears stated age and no distress Lungs: clear to auscultation bilaterally Heart: regular rate and rhythm, S1, S2 normal, no murmur, click, rub or gallop  Laboratory:  Urine dipstick: 2000 for glucose, trace for leukocyte esterase and trace for protein.   Micro exam: not done.    Assessment:    urinary frequency  ---glucosuria, uncontrolled DM   Plan:    Maintain adequate hydration. Follow up if symptoms not improving, and as needed. d/c janumet and start [employer] to nutrition

## 2012-03-18 LAB — URINE CULTURE: Colony Count: 75000

## 2012-03-21 ENCOUNTER — Other Ambulatory Visit: Payer: Self-pay | Admitting: Family Medicine

## 2012-03-21 MED ORDER — GLUCOSE BLOOD VI STRP: ORAL_STRIP | Status: DC

## 2012-03-21 NOTE — Telephone Encounter (Signed)
[facility] [address] Per pt called this rx into Pharmacy yesterday and they told her we denied it. I do not see any escribe from them nor do I have anything from fax this morning Cb# [phone] NOTE pt stated at last visit she was advised to test her blood sugar 4 times a day and has just opened her last pack

## 2012-04-15 ENCOUNTER — Other Ambulatory Visit: Payer: Self-pay | Admitting: Family Medicine

## 2012-05-02 ENCOUNTER — Other Ambulatory Visit: Payer: Self-pay | Admitting: Family Medicine

## 2012-05-03 ENCOUNTER — Telehealth: Payer: Self-pay

## 2012-05-03 DIAGNOSIS — N289 Disorder of kidney and ureter, unspecified: Secondary | ICD-10-CM

## 2012-05-03 DIAGNOSIS — R772 Abnormality of alphafetoprotein: Secondary | ICD-10-CM

## 2012-05-03 NOTE — Telephone Encounter (Signed)
Message copied by Arnette Norris on Fri May 03, 2012  1:15 PM ------      Message from: Sheliah Hatch      Created: Fri May 03, 2012 10:32 AM       Pt's 24 hr protein is mildly elevated.  She is already on lisinopril for renal protection.  May need nephrology referral

## 2012-05-03 NOTE — Telephone Encounter (Signed)
Notes Recorded by Mal Amabile, MA on 05/03/2012 at 11:26 AM Called pt concerning lab results. Pt confirmed taking lisinopril. Pt consented to seeing nephro. I will have Arman Bogus put the referral in. MW Notes Recorded by Sheliah Hatch, MD on 05/03/2012 at 10:32 AM Pt's 24 hr protein is mildly elevated. She is already on lisinopril for renal protection. May need nephrology referral    Referral put in.      KP

## 2012-05-10 ENCOUNTER — Telehealth: Payer: Self-pay

## 2012-05-10 DIAGNOSIS — E119 Type 2 diabetes mellitus without complications: Secondary | ICD-10-CM

## 2012-05-10 DIAGNOSIS — E785 Hyperlipidemia, unspecified: Secondary | ICD-10-CM

## 2012-05-10 NOTE — Telephone Encounter (Signed)
Call from the pharmacy advising they have given the patient the wrong Rx giving her  Kazano 12.5-500 1 by mouth twice daily instead of what was faxed. I tried calling the patient. msg left to call the office     KP

## 2012-05-17 NOTE — Telephone Encounter (Signed)
Discussed with patient and she voiced understanding, pharmacy did make the patient aware and she says her blood sugars are much better on the correct dose. Repeat labs are due but Dr.LOwne did not want to repeat the A1c right now. Patient agreed. Labs put in and apt scheduled    272.4 250.00 Lipid, hep, bmp,     KP

## 2012-05-20 ENCOUNTER — Other Ambulatory Visit (INDEPENDENT_AMBULATORY_CARE_PROVIDER_SITE_OTHER): Payer: Medicare Other

## 2012-05-20 DIAGNOSIS — E785 Hyperlipidemia, unspecified: Secondary | ICD-10-CM

## 2012-05-20 DIAGNOSIS — E119 Type 2 diabetes mellitus without complications: Secondary | ICD-10-CM

## 2012-05-20 LAB — LIPID PANEL
HDL: 32.8 mg/dL — ABNORMAL LOW (ref >39.00)
Total CHOL/HDL Ratio: 3
Triglycerides: 155 mg/dL — ABNORMAL HIGH (ref 0.0–149.0)
VLDL: 31 mg/dL (ref 0.0–40.0)

## 2012-05-20 LAB — BASIC METABOLIC PANEL
Calcium: 8.4 mg/dL (ref 8.4–10.5)
GFR: 113.63 mL/min (ref >60.00)
Sodium: 137 mEq/L (ref 135–145)

## 2012-05-20 LAB — HEPATIC FUNCTION PANEL
ALT: 30 U/L (ref 0–35)
AST: 30 U/L (ref 0–37)
Albumin: 3.6 g/dL (ref 3.5–5.2)

## 2012-05-22 ENCOUNTER — Other Ambulatory Visit: Payer: Self-pay | Admitting: Family Medicine

## 2012-06-03 ENCOUNTER — Other Ambulatory Visit: Payer: Self-pay | Admitting: Family Medicine

## 2012-07-01 ENCOUNTER — Other Ambulatory Visit: Payer: Self-pay | Admitting: Family Medicine

## 2012-07-18 ENCOUNTER — Other Ambulatory Visit: Payer: Self-pay | Admitting: Family Medicine

## 2012-07-29 ENCOUNTER — Other Ambulatory Visit: Payer: Self-pay | Admitting: Family Medicine

## 2012-07-30 ENCOUNTER — Other Ambulatory Visit: Payer: Self-pay | Admitting: Family Medicine

## 2012-08-07 ENCOUNTER — Other Ambulatory Visit: Payer: Self-pay | Admitting: Nephrology

## 2012-08-07 DIAGNOSIS — R809 Proteinuria, unspecified: Secondary | ICD-10-CM

## 2012-08-12 ENCOUNTER — Ambulatory Visit
Admission: RE | Admit: 2012-08-12 | Discharge: 2012-08-12 | Disposition: A | Discharge status: Home / Self Care | Payer: Medicare Other | Source: Ambulatory Visit | Attending: Nephrology | Admitting: Nephrology

## 2012-08-12 DIAGNOSIS — R809 Proteinuria, unspecified: Secondary | ICD-10-CM

## 2012-08-20 ENCOUNTER — Other Ambulatory Visit: Payer: Self-pay | Admitting: Family Medicine

## 2012-08-20 MED ORDER — FUROSEMIDE 20 MG PO TABS: 20.0000 mg | ORAL_TABLET | Freq: Two times a day (BID) | ORAL | Status: DC

## 2012-08-20 NOTE — Telephone Encounter (Signed)
refill Furosemide 20MG  (Tab) 20 MG Take 1 tablet (20 mg total) by mouth 2 (two) times daily. #180 wt/2-refills last fill 9.30.13

## 2012-08-20 NOTE — Telephone Encounter (Signed)
Rx sent 

## 2012-08-22 ENCOUNTER — Other Ambulatory Visit (INDEPENDENT_AMBULATORY_CARE_PROVIDER_SITE_OTHER): Payer: Medicare Other

## 2012-08-22 DIAGNOSIS — E119 Type 2 diabetes mellitus without complications: Secondary | ICD-10-CM

## 2012-08-22 DIAGNOSIS — E785 Hyperlipidemia, unspecified: Secondary | ICD-10-CM

## 2012-08-22 LAB — LIPID PANEL
Cholesterol: 129 mg/dL (ref 0–200)
HDL: 30 mg/dL — ABNORMAL LOW (ref >39.00)
Total CHOL/HDL Ratio: 4
Triglycerides: 255 mg/dL — ABNORMAL HIGH (ref 0.0–149.0)

## 2012-08-22 LAB — BASIC METABOLIC PANEL
Calcium: 8.5 mg/dL (ref 8.4–10.5)
GFR: 109.04 mL/min (ref >60.00)
Glucose, Bld: 176 mg/dL — ABNORMAL HIGH (ref 70–99)
Potassium: 4 mEq/L (ref 3.5–5.1)
Sodium: 138 mEq/L (ref 135–145)

## 2012-08-22 LAB — MICROALBUMIN / CREATININE URINE RATIO
Creatinine,U: 32.7 mg/dL
Microalb, Ur: 6.9 mg/dL — ABNORMAL HIGH (ref 0.0–1.9)

## 2012-08-22 LAB — LDL CHOLESTEROL, DIRECT: Direct LDL: 67.1 mg/dL

## 2012-08-22 LAB — HEPATIC FUNCTION PANEL
ALT: 29 U/L (ref 0–35)
Albumin: 3.6 g/dL (ref 3.5–5.2)
Total Bilirubin: 0.6 mg/dL (ref 0.3–1.2)

## 2012-08-31 ENCOUNTER — Other Ambulatory Visit: Payer: Self-pay

## 2012-09-02 ENCOUNTER — Telehealth: Payer: Self-pay | Admitting: Family Medicine

## 2012-09-02 MED ORDER — ROSUVASTATIN CALCIUM 20 MG PO TABS: 20.0000 mg | ORAL_TABLET | Freq: Every day | ORAL | Status: DC

## 2012-09-02 NOTE — Telephone Encounter (Signed)
Refill: Crestor 20 mg tablet. Take 1 tablet by mouth every day. Qty 30. Last fill 07-29-12

## 2012-09-05 ENCOUNTER — Telehealth: Payer: Self-pay | Admitting: Family Medicine

## 2012-09-05 MED ORDER — FENOFIBRATE 160 MG PO TABS: ORAL_TABLET | ORAL | Status: DC

## 2012-09-05 NOTE — Telephone Encounter (Signed)
refill fenofibrate 160 MG tablet #30 wt/2-refills Take 1 tablet by mouth daily last fill 1.13.14

## 2012-10-01 ENCOUNTER — Other Ambulatory Visit: Payer: Self-pay | Admitting: Family Medicine

## 2012-10-10 ENCOUNTER — Other Ambulatory Visit: Payer: Self-pay | Admitting: Family Medicine

## 2012-11-10 ENCOUNTER — Other Ambulatory Visit: Payer: Self-pay | Admitting: Family Medicine

## 2012-11-19 ENCOUNTER — Other Ambulatory Visit: Payer: Self-pay | Admitting: Family Medicine

## 2012-11-27 ENCOUNTER — Other Ambulatory Visit: Payer: Self-pay | Admitting: Family Medicine

## 2012-12-02 ENCOUNTER — Other Ambulatory Visit: Payer: Self-pay | Admitting: Family Medicine

## 2012-12-12 ENCOUNTER — Encounter: Payer: Self-pay | Admitting: Family Medicine

## 2012-12-12 ENCOUNTER — Ambulatory Visit (INDEPENDENT_AMBULATORY_CARE_PROVIDER_SITE_OTHER): Payer: Medicare Other | Admitting: Family Medicine

## 2012-12-12 VITALS — BP 140/74 | HR 80 | Temp 98.7°F | Wt 262.0 lb

## 2012-12-12 DIAGNOSIS — J449 Chronic obstructive pulmonary disease, unspecified: Secondary | ICD-10-CM

## 2012-12-12 DIAGNOSIS — E119 Type 2 diabetes mellitus without complications: Secondary | ICD-10-CM

## 2012-12-12 DIAGNOSIS — E785 Hyperlipidemia, unspecified: Secondary | ICD-10-CM

## 2012-12-12 DIAGNOSIS — I10 Essential (primary) hypertension: Secondary | ICD-10-CM

## 2012-12-12 DIAGNOSIS — J4489 Other specified chronic obstructive pulmonary disease: Secondary | ICD-10-CM

## 2012-12-12 LAB — CBC WITH DIFFERENTIAL/PLATELET
Basophils Absolute: 0.1 10*3/uL (ref 0.0–0.1)
Basophils Relative: 1.2 % (ref 0.0–3.0)
Eosinophils Relative: 0.9 % (ref 0.0–5.0)
HCT: 45 % (ref 36.0–46.0)
Hemoglobin: 14.9 g/dL (ref 12.0–15.0)
Lymphocytes Relative: 22.6 % (ref 12.0–46.0)
Lymphs Abs: 1.8 10*3/uL (ref 0.7–4.0)
Monocytes Relative: 8 % (ref 3.0–12.0)
Neutro Abs: 5.5 10*3/uL (ref 1.4–7.7)
RBC: 5.05 Mil/uL (ref 3.87–5.11)
WBC: 8.1 10*3/uL (ref 4.5–10.5)

## 2012-12-12 LAB — BASIC METABOLIC PANEL
Calcium: 8.4 mg/dL (ref 8.4–10.5)
GFR: 111.15 mL/min (ref >60.00)
Glucose, Bld: 195 mg/dL — ABNORMAL HIGH (ref 70–99)
Potassium: 3.8 mEq/L (ref 3.5–5.1)
Sodium: 136 mEq/L (ref 135–145)

## 2012-12-12 LAB — HEMOGLOBIN A1C: Hgb A1c MFr Bld: 7.8 % — ABNORMAL HIGH (ref 4.6–6.5)

## 2012-12-12 LAB — HEPATIC FUNCTION PANEL
ALT: 28 U/L (ref 0–35)
AST: 26 U/L (ref 0–37)
Albumin: 3.5 g/dL (ref 3.5–5.2)
Alkaline Phosphatase: 41 U/L (ref 39–117)

## 2012-12-12 LAB — LIPID PANEL
HDL: 32.3 mg/dL — ABNORMAL LOW (ref >39.00)
Triglycerides: 225 mg/dL — ABNORMAL HIGH (ref 0.0–149.0)

## 2012-12-12 LAB — MICROALBUMIN / CREATININE URINE RATIO: Creatinine,U: 89.2 mg/dL

## 2012-12-12 LAB — LDL CHOLESTEROL, DIRECT: Direct LDL: 62.5 mg/dL

## 2012-12-12 NOTE — Progress Notes (Signed)
  Subjective:    Patient ID: Heidi Stark, female    DOB: Apr 10, 1941, 72 y.o.   MRN: [id number]  HPI HYPERTENSION Disease Monitoring Blood pressure range-not checking at home Chest pain- no      Dyspnea- no Medications Compliance- good Lightheadedness- no   Edema- no   DIABETES Disease Monitoring Blood Sugar ranges-100-190 Polyuria- no New Visual problems- no Medications Compliance- good Hypoglycemic symptoms- no   HYPERLIPIDEMIA Disease Monitoring See symptoms for Hypertension Medications Compliance- good RUQ pain- no  Muscle aches- no  ROS See HPI above   PMH Smoking Status noted     Review of Systems    as above  Objective:   Physical Exam        Assessment & Plan:

## 2012-12-12 NOTE — Assessment & Plan Note (Signed)
con't advair

## 2012-12-12 NOTE — Assessment & Plan Note (Signed)
Pt is not taking invokana-- she didn't realize she was supposed to get a RX after samples Will check labs before adding meds

## 2012-12-12 NOTE — Assessment & Plan Note (Signed)
Check labs Con' meds 

## 2012-12-12 NOTE — Patient Instructions (Signed)
Diabetes and Exercise Regular exercise is important and can help:   Control blood glucose (sugar).  Decrease blood pressure.    Control blood lipids (cholesterol, triglycerides).  Improve overall health. BENEFITS FROM EXERCISE  Improved fitness.  Improved flexibility.  Improved endurance.  Increased bone density.  Weight control.  Increased muscle strength.  Decreased body fat.  Improvement of the body's use of insulin, a hormone.  Increased insulin sensitivity.  Reduction of insulin needs.  Reduced stress and tension.  Helps you feel better. People with diabetes who add exercise to their lifestyle gain additional benefits, including:  Weight loss.  Reduced appetite.  Improvement of the body's use of blood glucose.  Decreased risk factors for heart disease:  Lowering of cholesterol and triglycerides.  Raising the level of good cholesterol (high-density lipoproteins, HDL).  Lowering blood sugar.  Decreased blood pressure. TYPE 1 DIABETES AND EXERCISE  Exercise will usually lower your blood glucose.  If blood glucose is greater than 240 mg/dl, check urine ketones. If ketones are present, do not exercise.  Location of the insulin injection sites may need to be adjusted with exercise. Avoid injecting insulin into areas of the body that will be exercised. For example, avoid injecting insulin into:  The arms when playing tennis.  The legs when jogging. For more information, discuss this with your caregiver.  Keep a record of:  Food intake.  Type and amount of exercise.  Expected peak times of insulin action.  Blood glucose levels. Do this before, during, and after exercise. Review your records with your caregiver. This will help you to develop guidelines for adjusting food intake and insulin amounts.  TYPE 2 DIABETES AND EXERCISE  Regular physical activity can help control blood glucose.  Exercise is important because it may:  Increase the  body's sensitivity to insulin.  Improve blood glucose control.  Exercise reduces the risk of heart disease. It decreases serum cholesterol and triglycerides. It also lowers blood pressure.  Those who take insulin or oral hypoglycemic agents should watch for signs of hypoglycemia. These signs include dizziness, shaking, sweating, chills, and confusion.  Body water is lost during exercise. It must be replaced. This will help to avoid loss of body fluids (dehydration) or heat stroke. Be sure to talk to your caregiver before starting an exercise program to make sure it is safe for you. Remember, any activity is better than none.  Document Released: 09/23/2003 Document Revised: 09/25/2011 Document Reviewed: 01/07/2009 [facility] Patient Information 2014 [place], [place].  Diabetes and Standards of Medical Care  Diabetes is complicated. You may find that your diabetes team includes a dietitian, nurse, diabetes educator, eye doctor, and more. To help everyone know what is going on and to help you get the care you deserve, the following schedule of care was developed to help keep you on track. Below are the tests, exams, vaccines, medicines, education, and plans you will need. A1c test  Performed at least 2 times a year if you are meeting treatment goals.  Performed 4 times a year if therapy has changed or if you are not meeting treatment goals. Blood pressure test  Performed at every routine medical visit. The goal is less than 120/80 mmHg. Dental exam  Follow up with the dentist regularly. Eye exam  Diagnosed with type 1 diabetes as a child: Get an exam upon reaching the age of 10 years or older and having had diabetes for 3 5 years. Yearly eye exams are recommended after that initial eye exam.  Diagnosed with type 1 diabetes as an adult: Get an exam within 5 years of diagnosis and then yearly.  Diagnosed with type 2 diabetes: Get an exam as soon as possible after the diagnosis and then  yearly. Foot care exam  Visual foot exams are performed at every routine medical visit. The exams check for cuts, injuries, or other problems with the feet.  A comprehensive foot exam should be done yearly. This includes visual inspection as well as assessing foot pulses and testing for loss of sensation. Kidney function test (urine microalbumin)  Performed once a year.  Type 1 diabetes: The first test is performed 5 years after diagnosis.  Type 2 diabetes: The first test is performed at the time of diagnosis.  A serum creatinine and estimated glomerular filtration rate (eGFR) test is done once a year to tell the level of chronic kidney disease (CKD), if present. Lipid profile (Cholesterol, HDL, LDL, Triglycerides)  Performed every 5 years for most people.  The goal for LDL is less than 100 mg/dl. If at high risk, the goal is less than 70 mg/dl.  The goal for HDL is 40 mg/dl 50 mg/dl for men and 50 mg/dl 60 mg/dl for women. An HDL cholesterol of 60 mg/dL or higher gives some protection against heart disease.  The goal for triglycerides is less than 150 mg/dl. Influenza vaccine, pneumococcal vaccine, and hepatitis B vaccine  The influenza vaccine is recommended yearly.  The pneumococcal vaccine is generally given once in a lifetime. However, there are some instances when another vaccination is recommended. Check with your caregiver.  The hepatitis B vaccine is also recommended for adults with diabetes. Diabetes self-management education  Recommended at diagnosis and ongoing as needed. Treatment plan  Reviewed at every medical visit. Document Released: 04/30/2009 Document Revised: 06/19/2012 Document Reviewed: 01/03/2011 [facility] Patient Information 2014 [place], [place].

## 2012-12-12 NOTE — Assessment & Plan Note (Signed)
Stable Cont meds 

## 2012-12-13 ENCOUNTER — Telehealth: Payer: Self-pay

## 2012-12-13 DIAGNOSIS — E119 Type 2 diabetes mellitus without complications: Secondary | ICD-10-CM

## 2012-12-13 NOTE — Telephone Encounter (Signed)
Endo referral put in.      KP//CMA

## 2012-12-17 LAB — POCT URINALYSIS DIPSTICK
Glucose, UA: NEGATIVE
Ketones, UA: NEGATIVE
Leukocytes, UA: NEGATIVE
Spec Grav, UA: 1.01
Urobilinogen, UA: 0.2

## 2012-12-30 ENCOUNTER — Other Ambulatory Visit: Payer: Self-pay | Admitting: Family Medicine

## 2013-01-01 ENCOUNTER — Encounter: Payer: Self-pay | Admitting: Family Medicine

## 2013-01-01 ENCOUNTER — Telehealth: Payer: Self-pay | Admitting: *Deleted

## 2013-01-01 MED ORDER — LISINOPRIL 5 MG PO TABS: ORAL_TABLET | ORAL | Status: DC

## 2013-01-01 NOTE — Telephone Encounter (Signed)
Please advise on the dose and directions.     KP

## 2013-01-01 NOTE — Telephone Encounter (Signed)
Refill request for lisinopril 5mg . Refill done.

## 2013-01-02 ENCOUNTER — Other Ambulatory Visit: Payer: Self-pay | Admitting: Family Medicine

## 2013-01-02 MED ORDER — CANAGLIFLOZIN 100 MG PO TABS: 1.0000 | ORAL_TABLET | Freq: Every morning | ORAL | Status: DC

## 2013-01-02 NOTE — Telephone Encounter (Signed)
Patient is on Invokana, does she also need the Kazano. Please advise     KP

## 2013-01-29 ENCOUNTER — Other Ambulatory Visit: Payer: Self-pay | Admitting: Family Medicine

## 2013-01-29 MED ORDER — METOPROLOL TARTRATE 25 MG PO TABS: ORAL_TABLET | ORAL | Status: DC

## 2013-02-12 LAB — HM DIABETES EYE EXAM

## 2013-02-25 ENCOUNTER — Other Ambulatory Visit: Payer: Self-pay | Admitting: Family Medicine

## 2013-03-01 ENCOUNTER — Other Ambulatory Visit: Payer: Self-pay | Admitting: Internal Medicine

## 2013-03-04 ENCOUNTER — Other Ambulatory Visit: Payer: Self-pay | Admitting: *Deleted

## 2013-03-04 MED ORDER — POTASSIUM CHLORIDE ER 10 MEQ PO TBCR: EXTENDED_RELEASE_TABLET | ORAL | Status: DC

## 2013-03-04 NOTE — Telephone Encounter (Signed)
Rx was refilled for Klor-con 10 meq   Ag cma

## 2013-03-10 ENCOUNTER — Other Ambulatory Visit: Payer: Self-pay | Admitting: Family Medicine

## 2013-03-30 ENCOUNTER — Other Ambulatory Visit: Payer: Self-pay | Admitting: Family Medicine

## 2013-03-31 ENCOUNTER — Other Ambulatory Visit: Payer: Self-pay | Admitting: Family Medicine

## 2013-04-12 ENCOUNTER — Other Ambulatory Visit: Payer: Self-pay | Admitting: Family Medicine

## 2013-04-16 ENCOUNTER — Encounter: Payer: Self-pay | Admitting: Family Medicine

## 2013-04-16 ENCOUNTER — Ambulatory Visit (INDEPENDENT_AMBULATORY_CARE_PROVIDER_SITE_OTHER): Payer: Medicare Other | Admitting: Family Medicine

## 2013-04-16 VITALS — BP 122/78 | HR 65 | Temp 98.6°F | Wt 255.2 lb

## 2013-04-16 DIAGNOSIS — L0231 Cutaneous abscess of buttock: Secondary | ICD-10-CM | POA: Insufficient documentation

## 2013-04-16 MED ORDER — DOXYCYCLINE HYCLATE 100 MG PO TABS: 100.0000 mg | ORAL_TABLET | Freq: Two times a day (BID) | ORAL | Status: DC

## 2013-04-16 NOTE — Patient Instructions (Addendum)
Abscess An abscess is an infected area that contains a collection of pus and debris.It can occur in almost any part of the body. An abscess is also known as a furuncle or boil. CAUSES  An abscess occurs when tissue gets infected. This can occur from blockage of oil or sweat glands, infection of hair follicles, or a minor injury to the skin. As the body tries to fight the infection, pus collects in the area and creates pressure under the skin. This pressure causes pain. People with weakened immune systems have difficulty fighting infections and get certain abscesses more often.  SYMPTOMS Usually an abscess develops on the skin and becomes a painful mass that is red, warm, and tender. If the abscess forms under the skin, you may feel a moveable soft area under the skin. Some abscesses break open (rupture) on their own, but most will continue to get worse without care. The infection can spread deeper into the body and eventually into the bloodstream, causing you to feel ill.  DIAGNOSIS  Your caregiver will take your medical history and perform a physical exam. A sample of fluid may also be taken from the abscess to determine what is causing your infection. TREATMENT  Your caregiver may prescribe antibiotic medicines to fight the infection. However, taking antibiotics alone usually does not cure an abscess. Your caregiver may need to make a small cut (incision) in the abscess to drain the pus. In some cases, gauze is packed into the abscess to reduce pain and to continue draining the area. HOME CARE INSTRUCTIONS   Only take over-the-counter or prescription medicines for pain, discomfort, or fever as directed by your caregiver.  If you were prescribed antibiotics, take them as directed. Finish them even if you start to feel better.  If gauze is used, follow your caregiver's directions for changing the gauze.  To avoid spreading the infection:  Keep your draining abscess covered with a  bandage.  Wash your hands well.  Do not share personal care items, towels, or whirlpools with others.  Avoid skin contact with others.  Keep your skin and clothes clean around the abscess.  Keep all follow-up appointments as directed by your caregiver. SEEK MEDICAL CARE IF:   You have increased pain, swelling, redness, fluid drainage, or bleeding.  You have muscle aches, chills, or a general ill feeling.  You have a fever. MAKE SURE YOU:   Understand these instructions.  Will watch your condition.  Will get help right away if you are not doing well or get worse. Document Released: 04/12/2005 Document Revised: 01/02/2012 Document Reviewed: 09/15/2011 [employer] Patient Information 2014 [employer].  

## 2013-04-16 NOTE — Assessment & Plan Note (Signed)
Doxycycline for 10 days rto 1 week or sooner prn Culture done

## 2013-04-16 NOTE — Progress Notes (Signed)
  Subjective:    Patient ID: Heidi Stark, female    DOB: 1940-10-11, 72 y.o.   MRN: [id number]  HPI Pt here c/o abscess that drained and has been sore x several weeks.   Review of Systems As above    Objective:   Physical Exam BP 122/78  Pulse 65  Temp(Src) 98.6 F (37 C) (Oral)  Wt 255 lb 3.2 oz (115.758 kg)  BMI 42.47 kg/m2  SpO2 96% General appearance: alert, cooperative, appears stated age and no distress Skin: + errythema and draining for open sore on L buttock with surronding errythema       Assessment & Plan:

## 2013-04-19 LAB — WOUND CULTURE

## 2013-05-03 ENCOUNTER — Other Ambulatory Visit: Payer: Self-pay | Admitting: Family Medicine

## 2013-05-05 ENCOUNTER — Encounter: Payer: Self-pay | Admitting: Family Medicine

## 2013-05-05 DIAGNOSIS — L0231 Cutaneous abscess of buttock: Secondary | ICD-10-CM

## 2013-05-05 MED ORDER — DOXYCYCLINE HYCLATE 100 MG PO TABS: 100.0000 mg | ORAL_TABLET | Freq: Two times a day (BID) | ORAL | Status: DC

## 2013-05-16 ENCOUNTER — Other Ambulatory Visit: Payer: Self-pay | Admitting: Family Medicine

## 2013-05-24 ENCOUNTER — Other Ambulatory Visit: Payer: Self-pay | Admitting: Family Medicine

## 2013-05-30 ENCOUNTER — Other Ambulatory Visit: Payer: Self-pay | Admitting: Family Medicine

## 2013-06-04 ENCOUNTER — Other Ambulatory Visit: Payer: Self-pay | Admitting: Family Medicine

## 2013-06-17 ENCOUNTER — Encounter: Payer: Self-pay | Admitting: Family Medicine

## 2013-06-17 DIAGNOSIS — E785 Hyperlipidemia, unspecified: Secondary | ICD-10-CM

## 2013-06-18 ENCOUNTER — Other Ambulatory Visit: Payer: Self-pay | Admitting: Family Medicine

## 2013-06-26 ENCOUNTER — Other Ambulatory Visit: Payer: Self-pay | Admitting: Family Medicine

## 2013-07-29 ENCOUNTER — Other Ambulatory Visit: Payer: Self-pay | Admitting: Family Medicine

## 2013-07-30 ENCOUNTER — Other Ambulatory Visit: Payer: Self-pay | Admitting: Family Medicine

## 2013-08-28 ENCOUNTER — Other Ambulatory Visit: Payer: Self-pay | Admitting: Family Medicine

## 2013-08-29 NOTE — Telephone Encounter (Signed)
Dr.Lowne this patient is on [place], Colvin Caroli and Amaryl is this accurate. Please advise     KP

## 2013-09-01 ENCOUNTER — Other Ambulatory Visit: Payer: Self-pay | Admitting: Family Medicine

## 2013-09-29 ENCOUNTER — Other Ambulatory Visit: Payer: Self-pay | Admitting: Family Medicine

## 2013-10-23 ENCOUNTER — Other Ambulatory Visit: Payer: Self-pay

## 2013-10-28 ENCOUNTER — Emergency Department (HOSPITAL_COMMUNITY): Payer: Medicare Other

## 2013-10-28 ENCOUNTER — Telehealth: Payer: Self-pay

## 2013-10-28 ENCOUNTER — Encounter (HOSPITAL_COMMUNITY): Payer: Self-pay | Admitting: Emergency Medicine

## 2013-10-28 ENCOUNTER — Inpatient Hospital Stay (HOSPITAL_COMMUNITY)
Admission: EM | Admit: 2013-10-28 | Discharge: 2013-11-03 | DRG: 814 | Disposition: A | Discharge status: Home Health | Payer: Medicare Other | Attending: Internal Medicine | Admitting: Internal Medicine

## 2013-10-28 DIAGNOSIS — I7389 Other specified peripheral vascular diseases: Secondary | ICD-10-CM

## 2013-10-28 DIAGNOSIS — Z808 Family history of malignant neoplasm of other organs or systems: Secondary | ICD-10-CM

## 2013-10-28 DIAGNOSIS — I4891 Unspecified atrial fibrillation: Secondary | ICD-10-CM | POA: Diagnosis present

## 2013-10-28 DIAGNOSIS — E669 Obesity, unspecified: Secondary | ICD-10-CM

## 2013-10-28 DIAGNOSIS — I480 Paroxysmal atrial fibrillation: Secondary | ICD-10-CM | POA: Diagnosis not present

## 2013-10-28 DIAGNOSIS — N280 Ischemia and infarction of kidney: Secondary | ICD-10-CM

## 2013-10-28 DIAGNOSIS — I700 Atherosclerosis of aorta: Secondary | ICD-10-CM | POA: Diagnosis present

## 2013-10-28 DIAGNOSIS — L0231 Cutaneous abscess of buttock: Secondary | ICD-10-CM

## 2013-10-28 DIAGNOSIS — IMO0001 Reserved for inherently not codable concepts without codable children: Secondary | ICD-10-CM

## 2013-10-28 DIAGNOSIS — Z79899 Other long term (current) drug therapy: Secondary | ICD-10-CM

## 2013-10-28 DIAGNOSIS — I509 Heart failure, unspecified: Secondary | ICD-10-CM | POA: Diagnosis not present

## 2013-10-28 DIAGNOSIS — N2889 Other specified disorders of kidney and ureter: Secondary | ICD-10-CM

## 2013-10-28 DIAGNOSIS — L03317 Cellulitis of buttock: Secondary | ICD-10-CM

## 2013-10-28 DIAGNOSIS — R1012 Left upper quadrant pain: Secondary | ICD-10-CM | POA: Diagnosis present

## 2013-10-28 DIAGNOSIS — D126 Benign neoplasm of colon, unspecified: Secondary | ICD-10-CM

## 2013-10-28 DIAGNOSIS — J9600 Acute respiratory failure, unspecified whether with hypoxia or hypercapnia: Secondary | ICD-10-CM | POA: Diagnosis not present

## 2013-10-28 DIAGNOSIS — Z87891 Personal history of nicotine dependence: Secondary | ICD-10-CM

## 2013-10-28 DIAGNOSIS — J019 Acute sinusitis, unspecified: Secondary | ICD-10-CM

## 2013-10-28 DIAGNOSIS — R062 Wheezing: Secondary | ICD-10-CM | POA: Diagnosis present

## 2013-10-28 DIAGNOSIS — E876 Hypokalemia: Secondary | ICD-10-CM | POA: Diagnosis present

## 2013-10-28 DIAGNOSIS — Z86718 Personal history of other venous thrombosis and embolism: Secondary | ICD-10-CM | POA: Diagnosis present

## 2013-10-28 DIAGNOSIS — J449 Chronic obstructive pulmonary disease, unspecified: Secondary | ICD-10-CM

## 2013-10-28 DIAGNOSIS — J441 Chronic obstructive pulmonary disease with (acute) exacerbation: Secondary | ICD-10-CM

## 2013-10-28 DIAGNOSIS — J4489 Other specified chronic obstructive pulmonary disease: Secondary | ICD-10-CM | POA: Diagnosis present

## 2013-10-28 DIAGNOSIS — E785 Hyperlipidemia, unspecified: Secondary | ICD-10-CM | POA: Diagnosis present

## 2013-10-28 DIAGNOSIS — E1159 Type 2 diabetes mellitus with other circulatory complications: Secondary | ICD-10-CM | POA: Diagnosis present

## 2013-10-28 DIAGNOSIS — I10 Essential (primary) hypertension: Secondary | ICD-10-CM | POA: Diagnosis present

## 2013-10-28 DIAGNOSIS — E119 Type 2 diabetes mellitus without complications: Secondary | ICD-10-CM

## 2013-10-28 DIAGNOSIS — I739 Peripheral vascular disease, unspecified: Secondary | ICD-10-CM

## 2013-10-28 DIAGNOSIS — D735 Infarction of spleen: Secondary | ICD-10-CM | POA: Diagnosis present

## 2013-10-28 DIAGNOSIS — R110 Nausea: Secondary | ICD-10-CM | POA: Diagnosis present

## 2013-10-28 DIAGNOSIS — G4733 Obstructive sleep apnea (adult) (pediatric): Secondary | ICD-10-CM

## 2013-10-28 DIAGNOSIS — D72829 Elevated white blood cell count, unspecified: Secondary | ICD-10-CM | POA: Diagnosis present

## 2013-10-28 DIAGNOSIS — Z833 Family history of diabetes mellitus: Secondary | ICD-10-CM

## 2013-10-28 DIAGNOSIS — J984 Other disorders of lung: Secondary | ICD-10-CM

## 2013-10-28 DIAGNOSIS — D7389 Other diseases of spleen: Principal | ICD-10-CM

## 2013-10-28 DIAGNOSIS — I4892 Unspecified atrial flutter: Secondary | ICD-10-CM | POA: Diagnosis present

## 2013-10-28 DIAGNOSIS — R109 Unspecified abdominal pain: Secondary | ICD-10-CM | POA: Diagnosis present

## 2013-10-28 LAB — CBC WITH DIFFERENTIAL/PLATELET
BASOS PCT: 0 % (ref 0–1)
Basophils Absolute: 0 10*3/uL (ref 0.0–0.1)
EOS ABS: 0 10*3/uL (ref 0.0–0.7)
Eosinophils Relative: 0 % (ref 0–5)
HCT: 45 % (ref 36.0–46.0)
Hemoglobin: 14.7 g/dL (ref 12.0–15.0)
LYMPHS ABS: 2.1 10*3/uL (ref 0.7–4.0)
Lymphocytes Relative: 14 % (ref 12–46)
MCH: 28.6 pg (ref 26.0–34.0)
MCHC: 32.7 g/dL (ref 30.0–36.0)
MCV: 87.5 fL (ref 78.0–100.0)
MONOS PCT: 11 % (ref 3–12)
Monocytes Absolute: 1.7 10*3/uL — ABNORMAL HIGH (ref 0.1–1.0)
NEUTROS ABS: 11.5 10*3/uL — AB (ref 1.7–7.7)
NEUTROS PCT: 75 % (ref 43–77)
PLATELETS: 220 10*3/uL (ref 150–400)
RBC: 5.14 MIL/uL — ABNORMAL HIGH (ref 3.87–5.11)
RDW: 15.2 % (ref 11.5–15.5)
WBC: 15.4 10*3/uL — ABNORMAL HIGH (ref 4.0–10.5)

## 2013-10-28 LAB — COMPREHENSIVE METABOLIC PANEL
ALBUMIN: 3.4 g/dL — AB (ref 3.5–5.2)
ALK PHOS: 59 U/L (ref 39–117)
ALT: 22 U/L (ref 0–35)
AST: 33 U/L (ref 0–37)
BILIRUBIN TOTAL: 0.5 mg/dL (ref 0.3–1.2)
BUN: 20 mg/dL (ref 6–23)
CHLORIDE: 94 meq/L — AB (ref 96–112)
CO2: 24 mEq/L (ref 19–32)
Calcium: 9.1 mg/dL (ref 8.4–10.5)
Creatinine, Ser: 0.51 mg/dL (ref 0.50–1.10)
GFR calc Af Amer: >90 mL/min (ref >90)
GFR calc non Af Amer: >90 mL/min (ref >90)
GLUCOSE: 132 mg/dL — AB (ref 70–99)
Potassium: 3.7 mEq/L (ref 3.7–5.3)
SODIUM: 134 meq/L — AB (ref 137–147)
Total Protein: 7.8 g/dL (ref 6.0–8.3)

## 2013-10-28 LAB — URINALYSIS, ROUTINE W REFLEX MICROSCOPIC
BILIRUBIN URINE: NEGATIVE
Hgb urine dipstick: NEGATIVE
KETONES UR: 15 mg/dL — AB
Nitrite: NEGATIVE
Protein, ur: 30 mg/dL — AB
Specific Gravity, Urine: 1.037 — ABNORMAL HIGH (ref 1.005–1.030)
Urobilinogen, UA: 1 mg/dL (ref 0.0–1.0)
pH: 6 (ref 5.0–8.0)

## 2013-10-28 LAB — URINE MICROSCOPIC-ADD ON

## 2013-10-28 LAB — PROTIME-INR
INR: 1.01 (ref 0.00–1.49)
Prothrombin Time: 13.1 s (ref 11.6–15.2)

## 2013-10-28 LAB — LIPASE, BLOOD: LIPASE: 37 U/L (ref 11–59)

## 2013-10-28 LAB — APTT: aPTT: 33 s (ref 24–37)

## 2013-10-28 MED ORDER — SODIUM CHLORIDE 0.9 % IJ SOLN
3.0000 mL | Freq: Two times a day (BID) | INTRAMUSCULAR | Status: DC
  Administered 2013-10-29 – 2013-11-03 (×10): 3 mL via INTRAVENOUS

## 2013-10-28 MED ORDER — POTASSIUM CHLORIDE ER 10 MEQ PO TBCR
10.0000 meq | EXTENDED_RELEASE_TABLET | Freq: Two times a day (BID) | ORAL | Status: DC
  Administered 2013-10-28 – 2013-11-03 (×12): 10 meq via ORAL
  Filled 2013-10-28 (×15): qty 1

## 2013-10-28 MED ORDER — SODIUM CHLORIDE 0.9 % IV SOLN
2000.0000 mg | INTRAVENOUS | Status: AC
  Administered 2013-10-28: 2000 mg via INTRAVENOUS
  Filled 2013-10-28: qty 2000

## 2013-10-28 MED ORDER — DEXTROSE 5 % IV SOLN: 1.0000 g | INTRAVENOUS | Status: DC

## 2013-10-28 MED ORDER — HEPARIN (PORCINE) IN NACL 100-0.45 UNIT/ML-% IJ SOLN
2000.0000 [IU]/h | INTRAMUSCULAR | Status: DC
  Administered 2013-10-28: 1300 [IU]/h via INTRAVENOUS
  Administered 2013-10-29: 1650 [IU]/h via INTRAVENOUS
  Filled 2013-10-28 (×4): qty 250

## 2013-10-28 MED ORDER — FENOFIBRATE 160 MG PO TABS
160.0000 mg | ORAL_TABLET | Freq: Every day | ORAL | Status: DC
  Administered 2013-10-28 – 2013-11-03 (×7): 160 mg via ORAL
  Filled 2013-10-28 (×7): qty 1

## 2013-10-28 MED ORDER — SODIUM CHLORIDE 0.9 % IV BOLUS (SEPSIS)
1000.0000 mL | Freq: Once | INTRAVENOUS | Status: AC
  Administered 2013-10-28: 1000 mL via INTRAVENOUS

## 2013-10-28 MED ORDER — MORPHINE SULFATE 4 MG/ML IJ SOLN: 4.0000 mg | Freq: Once | INTRAMUSCULAR | Status: DC

## 2013-10-28 MED ORDER — VANCOMYCIN HCL IN DEXTROSE 1-5 GM/200ML-% IV SOLN
1000.0000 mg | Freq: Two times a day (BID) | INTRAVENOUS | Status: DC
  Administered 2013-10-29 – 2013-10-30 (×4): 1000 mg via INTRAVENOUS
  Filled 2013-10-28 (×5): qty 200

## 2013-10-28 MED ORDER — FUROSEMIDE 20 MG PO TABS
20.0000 mg | ORAL_TABLET | Freq: Two times a day (BID) | ORAL | Status: DC
  Administered 2013-10-29 – 2013-11-03 (×11): 20 mg via ORAL
  Filled 2013-10-28 (×14): qty 1

## 2013-10-28 MED ORDER — MORPHINE SULFATE 4 MG/ML IJ SOLN
4.0000 mg | Freq: Once | INTRAMUSCULAR | Status: AC
  Administered 2013-10-28: 4 mg via INTRAVENOUS
  Filled 2013-10-28: qty 1

## 2013-10-28 MED ORDER — ALBUTEROL SULFATE (2.5 MG/3ML) 0.083% IN NEBU
2.5000 mg | INHALATION_SOLUTION | Freq: Four times a day (QID) | RESPIRATORY_TRACT | Status: DC | PRN
  Administered 2013-10-30 (×2): 2.5 mg via RESPIRATORY_TRACT
  Filled 2013-10-28 (×2): qty 3

## 2013-10-28 MED ORDER — ALOGLIPTIN-METFORMIN HCL 12.5-1000 MG PO TABS
1.0000 | ORAL_TABLET | Freq: Two times a day (BID) | ORAL | Status: DC
  Administered 2013-10-29 – 2013-11-03 (×10): 1 via ORAL

## 2013-10-28 MED ORDER — MOMETASONE FURO-FORMOTEROL FUM 100-5 MCG/ACT IN AERO
2.0000 | INHALATION_SPRAY | Freq: Two times a day (BID) | RESPIRATORY_TRACT | Status: DC
  Administered 2013-10-28 – 2013-11-03 (×11): 2 via RESPIRATORY_TRACT
  Filled 2013-10-28 (×3): qty 8.8

## 2013-10-28 MED ORDER — DEXTROSE 5 % IV SOLN
2.0000 g | INTRAVENOUS | Status: DC
  Administered 2013-10-28 – 2013-11-02 (×6): 2 g via INTRAVENOUS
  Filled 2013-10-28 (×7): qty 2

## 2013-10-28 MED ORDER — ONDANSETRON HCL 4 MG/2ML IJ SOLN
4.0000 mg | Freq: Once | INTRAMUSCULAR | Status: AC
  Administered 2013-10-28: 4 mg via INTRAVENOUS
  Filled 2013-10-28: qty 2

## 2013-10-28 MED ORDER — ONDANSETRON HCL 4 MG/2ML IJ SOLN: 4.0000 mg | Freq: Four times a day (QID) | INTRAMUSCULAR | Status: DC | PRN

## 2013-10-28 MED ORDER — MORPHINE SULFATE 4 MG/ML IJ SOLN
4.0000 mg | INTRAMUSCULAR | Status: DC | PRN
  Administered 2013-10-28 – 2013-10-30 (×4): 4 mg via INTRAVENOUS
  Filled 2013-10-28 (×4): qty 1

## 2013-10-28 MED ORDER — HYDROCODONE-ACETAMINOPHEN 5-325 MG PO TABS
1.0000 | ORAL_TABLET | Freq: Four times a day (QID) | ORAL | Status: DC | PRN
  Administered 2013-10-29 – 2013-11-02 (×4): 1 via ORAL
  Filled 2013-10-28 (×4): qty 1

## 2013-10-28 MED ORDER — GLIMEPIRIDE 2 MG PO TABS
2.0000 mg | ORAL_TABLET | Freq: Every day | ORAL | Status: DC
  Administered 2013-10-29 – 2013-11-02 (×5): 2 mg via ORAL
  Filled 2013-10-28 (×6): qty 1

## 2013-10-28 MED ORDER — METOPROLOL TARTRATE 12.5 MG HALF TABLET
12.5000 mg | ORAL_TABLET | Freq: Two times a day (BID) | ORAL | Status: DC
  Administered 2013-10-29 (×3): 12.5 mg via ORAL
  Filled 2013-10-28 (×6): qty 1

## 2013-10-28 MED ORDER — ASPIRIN 81 MG PO TABS: 81.0000 mg | ORAL_TABLET | Freq: Every day | ORAL | Status: DC

## 2013-10-28 MED ORDER — LISINOPRIL 5 MG PO TABS
5.0000 mg | ORAL_TABLET | Freq: Every day | ORAL | Status: DC
  Administered 2013-10-29 – 2013-11-03 (×6): 5 mg via ORAL
  Filled 2013-10-28 (×6): qty 1

## 2013-10-28 MED ORDER — CANAGLIFLOZIN 100 MG PO TABS
100.0000 mg | ORAL_TABLET | Freq: Every day | ORAL | Status: DC
  Administered 2013-10-29 – 2013-11-03 (×5): 100 mg via ORAL
  Filled 2013-10-28 (×7): qty 1

## 2013-10-28 MED ORDER — HEPARIN BOLUS VIA INFUSION
4000.0000 [IU] | Freq: Once | INTRAVENOUS | Status: AC
  Administered 2013-10-28: 4000 [IU] via INTRAVENOUS
  Filled 2013-10-28: qty 4000

## 2013-10-28 MED ORDER — ATORVASTATIN CALCIUM 40 MG PO TABS
40.0000 mg | ORAL_TABLET | Freq: Every day | ORAL | Status: DC
  Administered 2013-10-29 – 2013-11-02 (×5): 40 mg via ORAL
  Filled 2013-10-28 (×6): qty 1

## 2013-10-28 MED ORDER — IOHEXOL 300 MG/ML  SOLN
100.0000 mL | Freq: Once | INTRAMUSCULAR | Status: AC | PRN
  Administered 2013-10-28: 100 mL via INTRAVENOUS

## 2013-10-28 NOTE — ED Notes (Signed)
PA at bedside.

## 2013-10-28 NOTE — ED Notes (Signed)
Patient back from CT dept 

## 2013-10-28 NOTE — ED Notes (Signed)
Call floor back after verifying with Dr. Alcario Drought that patient is to be on tele Nurse is now busy and not able to take report Call back number given ED charge nurse made aware

## 2013-10-28 NOTE — ED Notes (Signed)
MD at bedside. 

## 2013-10-28 NOTE — Progress Notes (Signed)
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 Sulfonamide Derivatives     Stomach cramps  . Neomycin-Bacitracin Zn-Polymyx Rash    Patient Measurements: Height: 5\' 6"  (167.6 cm) Weight: 260 lb (117.935 kg) IBW/kg (Calculated) : 59.3 Heparin Dosing Weight: 76 kg  Vital Signs: Temp: 100 F (37.8 C) (04/14 1919) Temp src: Rectal (04/14 1919) BP: 177/60 mmHg (04/14 1724) Pulse Rate: 76 (04/14 1724)  Labs:  Recent Labs  10/28/13 1809  HGB 14.7  HCT 45.0  PLT 220  CREATININE 0.51    Estimated Creatinine Clearance: 84.2 ml/min (by C-G formula based on Cr of 0.51).   Medical History: Past Medical History  Diagnosis Date  . COPD (chronic obstructive pulmonary disease)   . Hypertension   . Hyperlipidemia   . Diabetes mellitus     Type II    Medications:  Scheduled:  . Alogliptin-Metformin HCl  1 tablet Oral BID  . [START ON 10/29/2013] atorvastatin  40 mg Oral q1800  . Canagliflozin  100 mg Oral Daily  . fenofibrate  160 mg Oral Daily  . furosemide  20 mg Oral BID  . [START ON 10/29/2013] glimepiride  2 mg Oral Q supper  . [START ON 10/29/2013] lisinopril  5 mg Oral Daily  . metoprolol tartrate  12.5 mg Oral BID  . mometasone-formoterol  2 puff Inhalation BID  .  morphine injection  4 mg Intravenous Once  . potassium chloride  10 mEq Oral BID  . sodium chloride  3 mL Intravenous Q12H   Infusions:  . cefTRIAXone (ROCEPHIN)  IV    . heparin     Followed by  . 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 IV heparin per Rx ordered.  Goal of Therapy:  Heparin level 0.3-0.7 units/ml Monitor platelets by anticoagulation protocol: Yes   Plan:   Baseline coags stat  Heparin 4000 units IV x1  Start heparin drip @ 1300 units/hr  Daily  CBC/HL  Check 1st HL in 8 hours  Dorrene German 10/28/2013,10:09 PM

## 2013-10-28 NOTE — ED Notes (Signed)
Finished contrast. 4

## 2013-10-28 NOTE — Progress Notes (Signed)
ANTIBIOTIC CONSULT NOTE - INITIAL  Pharmacy Consult for Vancomycin  Indication: suspected endocarditis   Allergies  Allergen Reactions  . Sulfonamide Derivatives     Stomach cramps  . Neomycin-Bacitracin Zn-Polymyx Rash    Patient Measurements: Height: 5\' 6"  (167.6 cm) Weight: 260 lb (117.935 kg) IBW/kg (Calculated) : 59.3   Vital Signs: Temp: 100 F (37.8 C) (04/14 1919) Temp src: Rectal (04/14 1919) BP: 177/60 mmHg (04/14 1724) Pulse Rate: 76 (04/14 1724) Intake/Output from previous day:   Intake/Output from this shift:    Labs:  Recent Labs  10/28/13 1809  WBC 15.4*  HGB 14.7  PLT 220  CREATININE 0.51   Estimated Creatinine Clearance: 84.2 ml/min (by C-G formula based on Cr of 0.51). No results found for this basename: VANCOTROUGH, VANCOPEAK, VANCORANDOM, GENTTROUGH, GENTPEAK, GENTRANDOM, TOBRATROUGH, TOBRAPEAK, TOBRARND, AMIKACINPEAK, AMIKACINTROU, AMIKACIN,  in the last 72 hours   Microbiology: No results found for this or any previous visit (from the past 720 hour(s)).  Medical History: Past Medical History  Diagnosis Date  . COPD (chronic obstructive pulmonary disease)   . Hypertension   . Hyperlipidemia   . Diabetes mellitus     Type II    Medications:  Scheduled:  . Alogliptin-Metformin HCl  1 tablet Oral BID  . [START ON 10/29/2013] atorvastatin  40 mg Oral q1800  . Canagliflozin  100 mg Oral Daily  . fenofibrate  160 mg Oral Daily  . furosemide  20 mg Oral BID  . [START ON 10/29/2013] glimepiride  2 mg Oral Q supper  . [START ON 10/29/2013] lisinopril  5 mg Oral Daily  . metoprolol tartrate  12.5 mg Oral BID  . mometasone-formoterol  2 puff Inhalation BID  .  morphine injection  4 mg Intravenous Once  . potassium chloride  10 mEq Oral BID   Infusions:  . cefTRIAXone (ROCEPHIN)  IV     PRN: albuterol, HYDROcodone-acetaminophen, morphine injection, ondansetron (ZOFRAN) IV Assessment:  73 yo obese F presented to ER with abdominal pain.     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 Valvular calcifications in the mitral and aortic valve may support a valvular source.  Cardiomegaly observed with calcification of the mitral valve an aortic valve - Starting Vancomycin and Rocephin for r/o infectious endocarditis   Scr is WNL 0.51, CrCl 84 ml/min   WBC elevated 15.4  Temp 100  4/14 >> Vancomycin >>  4/14 >> Rocephin >>   Micro 4/14 Blood x 2: Sent   Goal of Therapy:  Vancomycin trough level 15-20 mcg/ml  Plan:  1.) Vancomycin 2000 mg IV x 1 stat, Then start 1 gram IV q12h 2.) Rocephin 2 grams IV Q24h 3.) f/u with echo, cultures  4.) Monitor renal function, vancomycin troughs as needed   Gaye Alken Gredmarie Delange PharmD Pager #: [phone] 10:17 PM 10/28/2013

## 2013-10-28 NOTE — ED Notes (Signed)
Pt c/o LUQ abdominal pain and nausea x 2 days.  Pain score 6/10, increasing w/ deep breathing.

## 2013-10-28 NOTE — Telephone Encounter (Signed)
C/o abdominal pain.  Described at stabbing pain.  Rated 7/10.  Started 2 days and persists.  Pain worsen with inspiration. Relieved by sitting still.  Denies N/V/D.  Last BM 2 days ago.  Normal for patient.  Lack of appetite; only eating soup and broth.  Drinking plenty of fluids.  Also has c/o feeling very weak and tired.  Patient is scheduled for an appointment with Dr. Larose Kells tomorrow.  However, daughter wants patient to have a CT Scan done tonight. They were encouraged to take patient to the ER.  They agreed with plan.  No further questions or concerns.

## 2013-10-28 NOTE — ED Provider Notes (Signed)
CSN: [id number]     Arrival date & time 10/28/13  1707 History   First MD Initiated Contact with Patient 10/28/13 1749     Chief Complaint  Patient presents with  . Abdominal Pain  . Nausea     (Consider location/radiation/quality/duration/timing/severity/associated sxs/prior Treatment) HPI 73 year old female presents with left lower abdominal pain over the past 2-3 days. She states the pain is improved with lying still and any kind of movement makes it worse. Patient states is also worse with deep inspiration. She's been feeling more short of breath than normal as well. She states she's been having a sharp, stabbing pain. She's been having nausea without vomiting. No diarrhea or constipation. Her last couple days she's noticed a "low-grade fever" the highest temperature being 101. Denies a cough.  Past Medical History  Diagnosis Date  . COPD (chronic obstructive pulmonary disease)   . Hypertension   . Hyperlipidemia   . Diabetes mellitus     Type II   Past Surgical History  Procedure Laterality Date  . Tubal ligation    . Knee surgery      Right Knee cart.Removal  . Cystectomy      between bladder and kidneys  . Back surgery      vein graft and colon repair after nicked artery with back surgert  . Ganglion cyst excision     Family History  Problem Relation Age of Onset  . Diabetes    . Melanoma     History  Substance Use Topics  . Smoking status: Former Smoker    Quit date: 07/17/2005  . Smokeless tobacco: Never Used  . Alcohol Use: No   OB History   Grav Para Term Preterm Abortions TAB SAB Ect Mult Living                 Review of Systems  Constitutional: Positive for fever.  Respiratory: Positive for shortness of breath. Negative for cough.   Cardiovascular: Negative for chest pain.  Gastrointestinal: Positive for nausea and abdominal pain. Negative for vomiting, diarrhea and constipation.  Genitourinary: Negative for dysuria.  Musculoskeletal: Negative for  back pain.  Neurological: Negative for weakness.  All other systems reviewed and are negative.     Allergies  Sulfonamide derivatives and Neomycin-bacitracin zn-polymyx  Home Medications   Prior to Admission medications   Medication Sig Start Date End Date Taking? Authorizing Provider  Alogliptin-Metformin HCl (KAZANO) 12.11-998 MG TABS Take 1 tablet by mouth 2 (two) times daily.   Yes Historical Provider, MD  aspirin 81 MG tablet Take 81 mg by mouth daily.     Yes Historical Provider, MD  Canagliflozin (INVOKANA) 100 MG TABS Take 100 mg by mouth daily.   Yes Historical Provider, MD  fenofibrate 160 MG tablet Take 160 mg by mouth daily.   Yes Historical Provider, MD  Fluticasone-Salmeterol (ADVAIR) 250-50 MCG/DOSE AEPB Inhale 1 puff into the lungs every 12 (twelve) hours. As needed for shortness of breath   Yes Historical Provider, MD  furosemide (LASIX) 20 MG tablet Take 20 mg by mouth 2 (two) times daily.   Yes Historical Provider, MD  glimepiride (AMARYL) 2 MG tablet Take 2 mg by mouth daily with supper.    Yes Historical Provider, MD  lisinopril (PRINIVIL,ZESTRIL) 5 MG tablet Take 5 mg by mouth daily.   Yes Historical Provider, MD  metoprolol tartrate (LOPRESSOR) 25 MG tablet Take 12.5 mg by mouth 2 (two) times daily.   Yes Historical Provider, MD  potassium chloride (  K-DUR) 10 MEQ tablet Take 10 mEq by mouth 2 (two) times daily.   Yes Historical Provider, MD  rosuvastatin (CRESTOR) 20 MG tablet Take 20 mg by mouth daily.   Yes Historical Provider, MD  albuterol (PROVENTIL) (2.5 MG/3ML) 0.083% nebulizer solution Take 3 mLs (2.5 mg total) by nebulization 4 (four) times daily as needed for wheezing. 12/01/10   Rosalita Chessman, DO  glucose blood test strip Check Blood Sugars four time daily 03/21/12   [name] Lowne, DO   BP 177/60  Pulse 76  Temp(Src) 98.1 F (36.7 C) (Oral)  Resp 18  SpO2 95% Physical Exam  Nursing note and vitals reviewed. Constitutional: She is oriented to  person, place, and time. She appears well-developed and well-nourished. No distress.  HENT:  Head: Normocephalic and atraumatic.  Right Ear: External ear normal.  Left Ear: External ear normal.  Nose: Nose normal.  Eyes: Right eye exhibits no discharge. Left eye exhibits no discharge.  Cardiovascular: Normal rate, regular rhythm and normal heart sounds.   Pulmonary/Chest: Effort normal and breath sounds normal. She has no wheezes.  Abdominal: Soft. There is tenderness (mild) in the left upper quadrant and left lower quadrant. There is no CVA tenderness.  Neurological: She is alert and oriented to person, place, and time.  Skin: Skin is warm and dry.    ED Course  Procedures (including critical care time) Labs Review Labs Reviewed  CBC WITH DIFFERENTIAL - Abnormal; Notable for the following:    WBC 15.4 (*)    RBC 5.14 (*)    Neutro Abs 11.5 (*)    Monocytes Absolute 1.7 (*)    All other components within normal limits  COMPREHENSIVE METABOLIC PANEL - Abnormal; Notable for the following:    Sodium 134 (*)    Chloride 94 (*)    Glucose, Bld 132 (*)    Albumin 3.4 (*)    All other components within normal limits  URINALYSIS, ROUTINE W REFLEX MICROSCOPIC - Abnormal; Notable for the following:    Specific Gravity, Urine 1.037 (*)    Glucose, UA >1000 (*)    Ketones, ur 15 (*)    Protein, ur 30 (*)    Leukocytes, UA SMALL (*)    All other components within normal limits  URINE MICROSCOPIC-ADD ON - Abnormal; Notable for the following:    Bacteria, UA FEW (*)    All other components within normal limits  CULTURE, BLOOD (ROUTINE X 2)  CULTURE, BLOOD (ROUTINE X 2)  LIPASE, BLOOD    Imaging Review Dg Chest 2 View  10/28/2013   CLINICAL DATA:  Short of breath, fever  EXAM: CHEST  2 VIEW  COMPARISON:  Prior chest x-ray 09/03/2009  FINDINGS: Stable mild cardiomegaly. Atherosclerotic calcifications noted in the transverse and descending thoracic aorta. Similar bronchitic changes and  interstitial prominence particularly in the bilateral lung bases compared to prior. No focal airspace consolidation, pleural effusion or pneumothorax. No acute osseous abnormality.  IMPRESSION: Stable cardiomegaly, chronic bronchitic changes and interstitial prominence without evidence of acute cardiopulmonary disease.   Electronically Signed   By: Jacqulynn Cadet M.D.   On: 10/28/2013 19:05   Ct Abdomen Pelvis W Contrast  10/28/2013   CLINICAL DATA:  Mid abdominal pain. Fatigue. Left lower abdominal pain.  EXAM: CT ABDOMEN AND PELVIS WITH CONTRAST  TECHNIQUE: Multidetector CT imaging of the abdomen and pelvis was performed using the standard protocol following bolus administration of intravenous contrast.  CONTRAST:  121mL OMNIPAQUE IOHEXOL 300 MG/ML  SOLN  COMPARISON:  US RENAL dated 08/12/2012; CT CHEST W/CM dated 08/14/2006  FINDINGS: Peripheral interstitial accentuation in the lung bases noted, increased from 08/14/2006. Mildly prominent pulmonary vasculature in the lower lobes.  Cardiomegaly observed with calcification of the mitral valve and aortic valve.  There is a new band of abnormal hypodensity in the spleen which persists on delayed images, compatible with splenic infarct. I estimate that this involves about 40% of the splenic volume.  Mild lobularity of the anterior margin of the left hepatic lobe.  1.6 x 4.0 cm left adrenal mass, portal venous phase image density 36 Hounsfield units, delayed density 26 Hounsfield units, relative washout 28% (indeterminate) however, similar appearance noted on prior chest CT.  Wedge shaped the hypodensities in the right kidney lower pole persist on delayed images and are suspicious for infarcts.  3 mm left kidney lower pole nonobstructive calculus. No hydronephrosis or hydroureter. Urinary bladder unremarkable.  A small right paracentral Richter hernia is observed on image 31 of series 2, containing a small margin of the transverse colon, without current findings of  strangulation.  Aortoiliac atherosclerotic vascular disease. Left external iliac artery appears severely thinned and attenuated, Re constituting at the level of the common femoral artery No pathologic upper abdominal adenopathy is observed. No pathologic pelvic adenopathy is observed. No dilated bowel observed. Uterine and adnexal contours unremarkable.  Considerable lumbar spondylosis and degenerative disc disease noted. Hepatomegaly is present.  Mildly mobile cecum noted with the cecum in the mid abdomen. Appendix normal.  IMPRESSION: 1. 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 Valvular calcifications in the mitral and aortic valve may support a valvular source - echocardiography is likely warranted. 2. 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 3. Hepatomegaly. 4. Cardiomegaly. 5. 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 6. Lumbar spondylosis and degenerative disc disease, with severe right foraminal stenosis at L5-S1. 7. 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 No current findings of strangulation. 8. Nonobstructive left nephrolithiasis.  Critical Value/emergent results were called by telephone at the time of interpretation on 10/28/2013 at 8:59 PM to Dr. Sherwood Gambler , who verbally acknowledged these results.   Electronically Signed   By: Sherryl Barters M.D.   On: 10/28/2013 21:02     EKG Interpretation   Date/Time:  Tuesday October 28 2013 17:45:03 EDT Ventricular Rate:  78 PR Interval:  174 QRS Duration: 130 QT Interval:  418 QTC Calculation: 476 R Axis:   -2 Text Interpretation:  Sinus rhythm Probable left ventricular hypertrophy  Anterior Q waves, possibly due to LVH No significant change since 2007  Confirmed by Kenneth Lax  MD, Hannah ([id number]) on 10/28/2013 5:53:20 PM       MDM   Final diagnoses:  Splenic infarct  Renal infarct    Patient's CT scan shows splenic and right renal infarcts as above. Given fever and concern for embolic source, their is concern for endocarditis. No murmur appreciated by me, but will need echo and blood cultures. Otherwise her CT is essentially negative and her pain is well controlled. Has low grade temp here. D/w hospitalist, Dr. Alcario Drought, who will order antibiotics to cover for endocarditis. As for anticoagulation, he will decide whether or not to heparinize as the source of the emboli is not clear yet.    Ephraim Hamburger, MD 10/29/13 [phone]

## 2013-10-28 NOTE — ED Notes (Signed)
Dr. Gardner at bedside 

## 2013-10-28 NOTE — H&P (Signed)
[facility] History and Physical  Heidi Stark [id number] DOB: [birth year] DOA: 10/28/2013  Referring physician: EDP PCP: Garnet Koyanagi, DO   Chief Complaint: ABD pain, nausea   HPI: Heidi Stark is a 73 y.o. female who presents to the ED with L sided abdominal pain for the past 2-3 days.  Improved by lying still and any type of movement makes it worse.  Sharp in quality, stabbing.  Nausea but no vomiting, no diarrhea nor constipation.  Low grade fever up to 101 at home for the past couple of days.  Work up in ED found acute splenic and right renal infarcts suspicious for arterial embolic source.  Review of Systems: Systems reviewed.  As above, otherwise negative  Past Medical History  Diagnosis Date  . COPD (chronic obstructive pulmonary disease)   . Hypertension   . Hyperlipidemia   . Diabetes mellitus     Type II   Past Surgical History  Procedure Laterality Date  . Tubal ligation    . Knee surgery      Right Knee cart.Removal  . Cystectomy      between bladder and kidneys  . Back surgery      vein graft and colon repair after nicked artery with back surgert  . Ganglion cyst excision     Social History:  reports that she quit smoking about 8 years ago. She has never used smokeless tobacco. She reports that she does not drink alcohol or use illicit drugs.  Allergies  Allergen Reactions  . Sulfonamide Derivatives     Stomach cramps  . Neomycin-Bacitracin Zn-Polymyx Rash    Family History  Problem Relation Age of Onset  . Diabetes    . Melanoma    . Factor V Leiden deficiency Daughter      Prior to Admission medications   Medication Sig Start Date End Date Taking? Authorizing Provider  Alogliptin-Metformin HCl (KAZANO) 12.11-998 MG TABS Take 1 tablet by mouth 2 (two) times daily.   Yes Historical Provider, MD  aspirin 81 MG tablet Take 81 mg by mouth daily.     Yes Historical Provider, MD  Canagliflozin (INVOKANA) 100 MG TABS Take 100 mg by mouth  daily.   Yes Historical Provider, MD  fenofibrate 160 MG tablet Take 160 mg by mouth daily.   Yes Historical Provider, MD  Fluticasone-Salmeterol (ADVAIR) 250-50 MCG/DOSE AEPB Inhale 1 puff into the lungs every 12 (twelve) hours. As needed for shortness of breath   Yes Historical Provider, MD  furosemide (LASIX) 20 MG tablet Take 20 mg by mouth 2 (two) times daily.   Yes Historical Provider, MD  glimepiride (AMARYL) 2 MG tablet Take 2 mg by mouth daily with supper.    Yes Historical Provider, MD  lisinopril (PRINIVIL,ZESTRIL) 5 MG tablet Take 5 mg by mouth daily.   Yes Historical Provider, MD  metoprolol tartrate (LOPRESSOR) 25 MG tablet Take 12.5 mg by mouth 2 (two) times daily.   Yes Historical Provider, MD  potassium chloride (K-DUR) 10 MEQ tablet Take 10 mEq by mouth 2 (two) times daily.   Yes Historical Provider, MD  rosuvastatin (CRESTOR) 20 MG tablet Take 20 mg by mouth daily.   Yes Historical Provider, MD  albuterol (PROVENTIL) (2.5 MG/3ML) 0.083% nebulizer solution Take 3 mLs (2.5 mg total) by nebulization 4 (four) times daily as needed for wheezing. 12/01/10   Rosalita Chessman, DO  glucose blood test strip Check Blood Sugars four time daily 03/21/12   Rosalita Chessman, DO  Physical Exam: Filed Vitals:   10/28/13 2213  BP: 142/70  Pulse: 80  Temp:   Resp: 20    BP 142/70  Pulse 80  Temp(Src) 100 F (37.8 C) (Rectal)  Resp 20  Ht 5\' 6"  (1.676 m)  Wt 117.935 kg (260 lb)  BMI 41.99 kg/m2  SpO2 95%  General Appearance:    Alert, oriented, no distress, appears stated age  Head:    Normocephalic, atraumatic  Eyes:    PERRL, EOMI, sclera non-icteric        Nose:   Nares without drainage or epistaxis. Mucosa, turbinates normal  Throat:   Moist mucous membranes. Oropharynx without erythema or exudate.  Neck:   Supple. No carotid bruits.  No thyromegaly.  No lymphadenopathy.   Back:     No CVA tenderness, no spinal tenderness  Lungs:     Clear to auscultation bilaterally, without  wheezes, rhonchi or rales  Chest wall:    No tenderness to palpitation  Heart:    Regular rate and rhythm, ? A murmur but not certain that I am hearing one  Abdomen:     Soft, non-tender, nondistended, normal bowel sounds, no organomegaly  Genitalia:    deferred  Rectal:    deferred  Extremities:   No clubbing, cyanosis or edema.  Pulses:   2+ and symmetric all extremities  Skin:   Skin color, texture, turgor normal, no rashes or lesions  Lymph nodes:   Cervical, supraclavicular, and axillary nodes normal  Neurologic:   CNII-XII intact. Normal strength, sensation and reflexes      throughout    Labs on Admission:  Basic Metabolic Panel:  Recent Labs Lab 10/28/13 1809  NA 134*  K 3.7  CL 94*  CO2 24  GLUCOSE 132*  BUN 20  CREATININE 0.51  CALCIUM 9.1   Liver Function Tests:  Recent Labs Lab 10/28/13 1809  AST 33  ALT 22  ALKPHOS 59  BILITOT 0.5  PROT 7.8  ALBUMIN 3.4*    Recent Labs Lab 10/28/13 1809  LIPASE 37   No results found for this basename: AMMONIA,  in the last 168 hours CBC:  Recent Labs Lab 10/28/13 1809  WBC 15.4*  NEUTROABS 11.5*  HGB 14.7  HCT 45.0  MCV 87.5  PLT 220   Cardiac Enzymes: No results found for this basename: CKTOTAL, CKMB, CKMBINDEX, TROPONINI,  in the last 168 hours  BNP (last 3 results) No results found for this basename: PROBNP,  in the last 8760 hours CBG: No results found for this basename: GLUCAP,  in the last 168 hours  Radiological Exams on Admission: Dg Chest 2 View  10/28/2013   CLINICAL DATA:  Short of breath, fever  EXAM: CHEST  2 VIEW  COMPARISON:  Prior chest x-ray 09/03/2009  FINDINGS: Stable mild cardiomegaly. Atherosclerotic calcifications noted in the transverse and descending thoracic aorta. Similar bronchitic changes and interstitial prominence particularly in the bilateral lung bases compared to prior. No focal airspace consolidation, pleural effusion or pneumothorax. No acute osseous abnormality.   IMPRESSION: Stable cardiomegaly, chronic bronchitic changes and interstitial prominence without evidence of acute cardiopulmonary disease.   Electronically Signed   By: Jacqulynn Cadet M.D.   On: 10/28/2013 19:05   Ct Abdomen Pelvis W Contrast  10/28/2013   CLINICAL DATA:  Mid abdominal pain. Fatigue. Left lower abdominal pain.  EXAM: CT ABDOMEN AND PELVIS WITH CONTRAST  TECHNIQUE: Multidetector CT imaging of the abdomen and pelvis was performed using the standard protocol  following bolus administration of intravenous contrast.  CONTRAST:  171mL OMNIPAQUE IOHEXOL 300 MG/ML  SOLN  COMPARISON:  US RENAL dated 08/12/2012; CT CHEST W/CM dated 08/14/2006  FINDINGS: Peripheral interstitial accentuation in the lung bases noted, increased from 08/14/2006. Mildly prominent pulmonary vasculature in the lower lobes.  Cardiomegaly observed with calcification of the mitral valve and aortic valve.  There is a new band of abnormal hypodensity in the spleen which persists on delayed images, compatible with splenic infarct. I estimate that this involves about 40% of the splenic volume.  Mild lobularity of the anterior margin of the left hepatic lobe.  1.6 x 4.0 cm left adrenal mass, portal venous phase image density 36 Hounsfield units, delayed density 26 Hounsfield units, relative washout 28% (indeterminate) however, similar appearance noted on prior chest CT.  Wedge shaped the hypodensities in the right kidney lower pole persist on delayed images and are suspicious for infarcts.  3 mm left kidney lower pole nonobstructive calculus. No hydronephrosis or hydroureter. Urinary bladder unremarkable.  A small right paracentral Richter hernia is observed on image 31 of series 2, containing a small margin of the transverse colon, without current findings of strangulation.  Aortoiliac atherosclerotic vascular disease. Left external iliac artery appears severely thinned and attenuated, Re constituting at the level of the common femoral  artery No pathologic upper abdominal adenopathy is observed. No pathologic pelvic adenopathy is observed. No dilated bowel observed. Uterine and adnexal contours unremarkable.  Considerable lumbar spondylosis and degenerative disc disease noted. Hepatomegaly is present.  Mildly mobile cecum noted with the cecum in the mid abdomen. Appendix normal.  IMPRESSION: 1. 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 Valvular calcifications in the mitral and aortic valve may support a valvular source - echocardiography is likely warranted. 2. 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 3. Hepatomegaly. 4. Cardiomegaly. 5. 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 6. Lumbar spondylosis and degenerative disc disease, with severe right foraminal stenosis at L5-S1. 7. 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 No current findings of strangulation. 8. Nonobstructive left nephrolithiasis.  Critical Value/emergent results were called by telephone at the time of interpretation on 10/28/2013 at 8:59 PM to Dr. Sherwood Gambler , who verbally acknowledged these results.   Electronically Signed   By: Sherryl Barters M.D.   On: 10/28/2013 21:02    EKG: Independently reviewed.  Assessment/Plan Principal Problem:   Splenic infarct Active Problems:   DIABETES MELLITUS, TYPE II   HYPERTENSION   Renal infarct   1. Splenic and renal infarcts - morphine for pain, highly worrisome for arterial embolic source.  DDX at this point is infectious (endocarditis), cholesterol embolus, and thrombotic (patient never diagnosed with clotting disorder, but daughter has both lupus anticoagulant and factor V leiden with multiple episodes of thrombus). 1. 2d echo ordered, if negative will likely require TEE at this point due to high  degree of suspicion. 2. Blood cultures ordered 3. Empiric rocephin and vanc for possible endocarditis (dont want to use gentamycin until we have a confirmed diagnosis due to toxicity of this drug). 4. Empiric heparin gtt for possible thromboembolism. 5. Factor 5 leiden, protein C, and lupus anticoagulant labs ordered. 2. DM 2- continue home meds for now 3. HTN - continue home meds    Code Status: Full Code  Family Communication: Daughter at bedside Disposition Plan:  Admit to inpatient   Time spent: 70 min  [facility] Pager [phone]  If 7AM-7PM, please contact the day team taking care of the patient [email or web address] Password [facility] 10/28/2013, 10:18 PM

## 2013-10-29 ENCOUNTER — Ambulatory Visit: Payer: Medicare Other | Admitting: Internal Medicine

## 2013-10-29 DIAGNOSIS — D126 Benign neoplasm of colon, unspecified: Secondary | ICD-10-CM

## 2013-10-29 DIAGNOSIS — J019 Acute sinusitis, unspecified: Secondary | ICD-10-CM

## 2013-10-29 LAB — BASIC METABOLIC PANEL
BUN: 16 mg/dL (ref 6–23)
CHLORIDE: 97 meq/L (ref 96–112)
CO2: 25 mEq/L (ref 19–32)
Calcium: 8.3 mg/dL — ABNORMAL LOW (ref 8.4–10.5)
Creatinine, Ser: 0.48 mg/dL — ABNORMAL LOW (ref 0.50–1.10)
GFR calc non Af Amer: >90 mL/min (ref >90)
Glucose, Bld: 189 mg/dL — ABNORMAL HIGH (ref 70–99)
Potassium: 3.7 mEq/L (ref 3.7–5.3)
Sodium: 135 mEq/L — ABNORMAL LOW (ref 137–147)

## 2013-10-29 LAB — CBC
HCT: 40.9 % (ref 36.0–46.0)
Hemoglobin: 13.5 g/dL (ref 12.0–15.0)
MCH: 28.6 pg (ref 26.0–34.0)
MCHC: 33 g/dL (ref 30.0–36.0)
MCV: 86.7 fL (ref 78.0–100.0)
PLATELETS: 222 10*3/uL (ref 150–400)
RBC: 4.72 MIL/uL (ref 3.87–5.11)
RDW: 15 % (ref 11.5–15.5)
WBC: 12.2 10*3/uL — AB (ref 4.0–10.5)

## 2013-10-29 LAB — LUPUS ANTICOAGULANT PANEL
DRVVT: 29.9 secs (ref <42.9)
LUPUS ANTICOAGULANT: NOT DETECTED
PTT Lupus Anticoagulant: 41.7 secs (ref 28.0–43.0)

## 2013-10-29 LAB — HEPARIN LEVEL (UNFRACTIONATED)
Heparin Unfractionated: <0.1 IU/mL — ABNORMAL LOW (ref 0.30–0.70)
Heparin Unfractionated: 0.1 IU/mL — ABNORMAL LOW (ref 0.30–0.70)

## 2013-10-29 LAB — GLUCOSE, CAPILLARY
GLUCOSE-CAPILLARY: 122 mg/dL — AB (ref 70–99)
GLUCOSE-CAPILLARY: 76 mg/dL (ref 70–99)
Glucose-Capillary: 111 mg/dL — ABNORMAL HIGH (ref 70–99)
Glucose-Capillary: 176 mg/dL — ABNORMAL HIGH (ref 70–99)

## 2013-10-29 LAB — PROTEIN C, TOTAL: PROTEIN C, TOTAL: 76 % (ref 72–160)

## 2013-10-29 LAB — PROTEIN C ACTIVITY: Protein C Activity: 131 % (ref 75–133)

## 2013-10-29 LAB — FACTOR 5 LEIDEN

## 2013-10-29 MED ORDER — HEPARIN BOLUS VIA INFUSION
3000.0000 [IU] | Freq: Once | INTRAVENOUS | Status: AC
  Administered 2013-10-29: 3000 [IU] via INTRAVENOUS
  Filled 2013-10-29: qty 3000

## 2013-10-29 MED ORDER — HEPARIN BOLUS VIA INFUSION
2500.0000 [IU] | Freq: Once | INTRAVENOUS | Status: AC
  Administered 2013-10-29: 2500 [IU] via INTRAVENOUS
  Filled 2013-10-29: qty 2500

## 2013-10-29 MED ORDER — INSULIN ASPART 100 UNIT/ML ~~LOC~~ SOLN
0.0000 [IU] | Freq: Three times a day (TID) | SUBCUTANEOUS | Status: DC
  Administered 2013-10-30 – 2013-10-31 (×2): 1 [IU] via SUBCUTANEOUS
  Administered 2013-11-01 – 2013-11-02 (×3): 2 [IU] via SUBCUTANEOUS

## 2013-10-29 NOTE — Consult Note (Signed)
CARDIOLOGY CONSULT NOTE   Patient ID: Heidi Stark MRN: [id number], DOB/AGE: 1940/08/28   Admit date: 10/28/2013 Date of Consult: 10/29/2013   Primary Physician: Garnet Koyanagi, DO Primary Cardiologist: Formerly Dr. Lia Foyer  Pt. 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  Problem List  Past Medical History  Diagnosis Date  . COPD (chronic obstructive pulmonary disease)   . Hypertension   . Hyperlipidemia   . Diabetes mellitus     Type II    Past Surgical History  Procedure Laterality Date  . Tubal ligation    . Knee surgery      Right Knee cart.Removal  . Cystectomy      between bladder and kidneys  . Back surgery      vein graft and colon repair after nicked artery with back surgert  . Ganglion cyst excision       Allergies  Allergies  Allergen Reactions  . Sulfonamide Derivatives     Stomach cramps  . Neomycin-Bacitracin Zn-Polymyx Rash    HPI   This pleasant woman with past history of diabetes, high blood pressure, and hypercholesterolemia but no prior history of ischemic heart disease.  He also has a past history of COPD.  He had a history of a respiratory arrest in 2007 with ventilator-dependent respiratory failure.  An echocardiogram at that time was of poor technical quality but showed overall normal LV systolic function.  A cardiac catheterization by Dr. Lia Foyer which reportedly showed no ischemic heart disease.  She quit smoking in 2007.  3 years ago she started to smoke again.  That several months ago she quit again. She does not have any history of heart murmur.  She has not had any recent dental work.  3 days ago she began to run a fever to 101. She denies any night sweats. 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 She herself denies any  history of DVT or clotting disorder but her daughter has a hypercoagulability clotting disorder (Factor V Leiden).  Inpatient Medications  . Alogliptin-Metformin HCl  1 tablet Oral BID  . atorvastatin  40 mg Oral q1800  . Canagliflozin  100 mg Oral Daily  . cefTRIAXone (ROCEPHIN)  IV  2 g Intravenous Q24H  . fenofibrate  160 mg Oral Daily  . furosemide  20 mg Oral BID  . glimepiride  2 mg Oral Q supper  . lisinopril  5 mg Oral Daily  . metoprolol tartrate  12.5 mg Oral BID  . mometasone-formoterol  2 puff Inhalation BID  . potassium chloride  10 mEq Oral BID  . sodium chloride  3 mL Intravenous Q12H  . vancomycin  1,000 mg Intravenous Q12H    Family History Family History  Problem Relation Age of Onset  . Diabetes    . Melanoma    . Factor V Leiden deficiency Daughter      Social History History   Social History  . Marital Status: Married    Spouse Name: N/A    Number of Children: N/A  . Years of Education: N/A   Occupational History  . Not on file.   Social History Main Topics  . Smoking status: Former Smoker    Quit date: 09/14/2005  . Smokeless tobacco: Never Used  . Alcohol Use: No  .  Drug Use: No  . Sexual Activity: Not on file   Other Topics Concern  . Not on file   Social History Narrative  . No narrative on file     Review of Systems  General:  No weight changes. Positive for recent fevers. Cardiovascular:  No chest pain, dyspnea on exertion, edema, orthopnea, palpitations, paroxysmal nocturnal dyspnea. Dermatological: No rash, lesions/masses Respiratory: No cough, dyspnea Urologic: No hematuria, dysuria Abdominal:   No nausea, vomiting, diarrhea, bright red blood per rectum, melena, or hematemesis. Positive for LUQ pain worse with deep breathing. Neurologic:  No visual changes, wkns, changes in mental status. All other systems reviewed and are otherwise negative except as noted above.  Physical Exam  Blood pressure 126/63, pulse 75,  temperature 98.3 F (36.8 C), temperature source Oral, resp. rate 18, height 5\' 6"  (1.676 m), weight 246 lb 4.1 oz (111.7 kg), SpO2 97.00%.  General: Pleasant, NAD Psych: Normal affect. Neuro: Alert and oriented X 3. Moves all extremities spontaneously. HEENT: Normal  Neck: Supple without bruits or JVD. Lungs:  Mild expiratory wheezes and rhonchi. Heart: RRR no s3, s4, or murmurs. Abdomen: Soft, non-tender, non-distended, BS + x 4.  Extremities: No clubbing, cyanosis or edema. DP/PT/Radials 2+ and equal bilaterally. Feet warm and well perfused.  Labs  No results found for this basename: CKTOTAL, CKMB, TROPONINI,  in the last 72 hours Lab Results  Component Value Date   WBC 12.2* 10/29/2013   HGB 13.5 10/29/2013   HCT 40.9 10/29/2013   MCV 86.7 10/29/2013   PLT 222 10/29/2013     Recent Labs Lab 10/28/13 1809 10/29/13 0750  NA 134* 135*  K 3.7 3.7  CL 94* 97  CO2 24 25  BUN 20 16  CREATININE 0.51 0.48*  CALCIUM 9.1 8.3*  PROT 7.8  --   BILITOT 0.5  --   ALKPHOS 59  --   ALT 22  --   AST 33  --   GLUCOSE 132* 189*   Lab Results  Component Value Date   CHOL 120 12/12/2012   HDL 32.30* 12/12/2012   LDLCALC 33 05/20/2012   TRIG 225.0* 12/12/2012   No results found for this basename: DDIMER    Radiology/Studies  Dg Chest 2 View  10/28/2013   CLINICAL DATA:  Short of breath, fever  EXAM: CHEST  2 VIEW  COMPARISON:  Prior chest x-ray 09/03/2009  FINDINGS: Stable mild cardiomegaly. Atherosclerotic calcifications noted in the transverse and descending thoracic aorta. Similar bronchitic changes and interstitial prominence particularly in the bilateral lung bases compared to prior. No focal airspace consolidation, pleural effusion or pneumothorax. No acute osseous abnormality.  IMPRESSION: Stable cardiomegaly, chronic bronchitic changes and interstitial prominence without evidence of acute cardiopulmonary disease.   Electronically Signed   By: Jacqulynn Cadet M.D.   On: 10/28/2013  19:05   Ct Abdomen Pelvis W Contrast  10/28/2013   CLINICAL DATA:  Mid abdominal pain. Fatigue. Left lower abdominal pain.  EXAM: CT ABDOMEN AND PELVIS WITH CONTRAST  TECHNIQUE: Multidetector CT imaging of the abdomen and pelvis was performed using the standard protocol following bolus administration of intravenous contrast.  CONTRAST:  158mL OMNIPAQUE IOHEXOL 300 MG/ML  SOLN  COMPARISON:  US RENAL dated 08/12/2012; CT CHEST W/CM dated 08/14/2006  FINDINGS: Peripheral interstitial accentuation in the lung bases noted, increased from 08/14/2006. Mildly prominent pulmonary vasculature in the lower lobes.  Cardiomegaly observed with calcification of the mitral valve and aortic valve.  There is a new band of  abnormal hypodensity in the spleen which persists on delayed images, compatible with splenic infarct. I estimate that this involves about 40% of the splenic volume.  Mild lobularity of the anterior margin of the left hepatic lobe.  1.6 x 4.0 cm left adrenal mass, portal venous phase image density 36 Hounsfield units, delayed density 26 Hounsfield units, relative washout 28% (indeterminate) however, similar appearance noted on prior chest CT.  Wedge shaped the hypodensities in the right kidney lower pole persist on delayed images and are suspicious for infarcts.  3 mm left kidney lower pole nonobstructive calculus. No hydronephrosis or hydroureter. Urinary bladder unremarkable.  A small right paracentral Richter hernia is observed on image 31 of series 2, containing a small margin of the transverse colon, without current findings of strangulation.  Aortoiliac atherosclerotic vascular disease. Left external iliac artery appears severely thinned and attenuated, Re constituting at the level of the common femoral artery No pathologic upper abdominal adenopathy is observed. No pathologic pelvic adenopathy is observed. No dilated bowel observed. Uterine and adnexal contours unremarkable.  Considerable lumbar spondylosis  and degenerative disc disease noted. Hepatomegaly is present.  Mildly mobile cecum noted with the cecum in the mid abdomen. Appendix normal.  IMPRESSION: 1. 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 Valvular calcifications in the mitral and aortic valve may support a valvular source - echocardiography is likely warranted. 2. 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 3. Hepatomegaly. 4. Cardiomegaly. 5. 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 6. Lumbar spondylosis and degenerative disc disease, with severe right foraminal stenosis at L5-S1. 7. 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 No current findings of strangulation. 8. Nonobstructive left nephrolithiasis.  Critical Value/emergent results were called by telephone at the time of interpretation on 10/28/2013 at 8:59 PM to Dr. Sherwood Gambler , who verbally acknowledged these results.   Electronically Signed   By: Sherryl Barters M.D.   On: 10/28/2013 21:02    ECG  Sinus rhythm Probable left ventricular hypertrophy Anterior Q waves, possibly due to LVH No change since 2007  ASSESSMENT AND PLAN 1.  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 Source. Will get 2D echo and consider also a TEE if nothing definitive seen on 2D echo. Source could be endocarditis but no real risk factors and no prior history of valve problems. Source may also be from atherosclerotic plaque in the aorta, for which she has multiple risk factors.  2. Diabetes. 3. COPD 4. Hypertension 5. Dyslipidemia.  Plan: Agree with workup as outlined in Dr. Juleen China note.   Signed, Darlin Coco, MD  10/29/2013, 10:27 AM

## 2013-10-29 NOTE — Progress Notes (Signed)
Inpatient Diabetes Program Recommendations  AACE/ADA: New Consensus Statement on Inpatient Glycemic Control (2013)  Target Ranges:  Prepandial:   less than 140 mg/dL      Peak postprandial:   less than 180 mg/dL (1-2 hours)      Critically ill patients:  140 - 180 mg/dL   Reason for Assessment:  Note history of diabetes  Diabetes history: Type 2 diabetes Outpatient Diabetes medications: Amaryl 2 mg daily, Alogliptin-Metformin HCL 12.11-998 mg bid, Invokana 100 mg daily Current orders for Inpatient glycemic control: Amaryl 2 mg daily, Alogliptin-Metformin HCL 12.11-998 mg bid, Invokana 100 mg daily  May consider holding oral diabetes agents while patient is in the hospital and add Novolog moderate tid with meals and HS scale.    Thanks,  Adah Perl, RN, BC-ADM Inpatient Diabetes Coordinator Pager [phone]

## 2013-10-29 NOTE — Progress Notes (Signed)
Patient ID: Heidi Stark, female   DOB: 1941-04-24, 73 y.o.   MRN: [id number]  [facility] PROGRESS NOTE  Heidi Stark HAL:[id number] DOB: August 20, 1940 DOA: 10/28/2013 PCP: Garnet Koyanagi, DO  Brief narrative: 73 y.o. female presented to ED with L sided abdominal pain that started 2-3 days prior to this admission, sharp and constant, 7/10 in severity, worse with movement and less intense with rest, non radiating, associated with nausea, poor oral intake, subjective fevers, chills.   In ED, imagining studies notable for acute splenic and right renal infarcts suspicious for arterial embolic source.   Principal Problem:   Splenic infarct, right renal infarct  - unclear etiology at this time - given pt's fevers, Tmax 100F over the past 24 hours and with leukocytosis, endocarditis is certainly in differential  - ? Atherosclerotic disease  - 2 D ECHO pending  - plan for TEE is TTE unrevealing  - continue heparin for now  Active Problems:   Leukocytosis - unclear etiology, UA suggestive of UTI but again endocarditis is in differential, CXR with bronchitic changes but no overt developing PNA  - pt is currently on Vancomycin and Rocephin - will follow up on urine and blood culture - follow up on TTE   - WBC is trending down, repeat CBC in AM   DIABETES MELLITUS, TYPE II - will check A1C - continue Amaryl, also place on SSI while inpatient    HYPERTENSION - reasonable inpatient control  Consultants:  Cardiology   Procedures/Studies: Dg Chest 2 View   10/28/2013   Stable cardiomegaly, chronic bronchitic changes and interstitial prominence without evidence of acute cardiopulmonary disease.    Ct Abdomen Pelvis W Contrast  10/28/2013   1. 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 Valvular calcifications in the mitral and aortic valve may support a valvular source - echocardiography is likely warranted. 2. Severe but likely chronic attenuation of the left  common iliac artery, which reconstitutes at the common femoral artery. 3. Hepatomegaly. 4. Cardiomegaly. 5. 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 6. Lumbar spondylosis and degenerative disc disease, with severe right foraminal stenosis at L5-S1. 7. 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 No current findings of strangulation. 8. Nonobstructive left nephrolithiasis.  Antibiotics:  Vancomycin 4/14 -->  Rocephin 4/14 -->   Code Status: Full Family Communication: Pt at bedside Disposition Plan: Home when medically stable  HPI/Subjective: No events overnight.   Objective: Filed Vitals:   10/28/13 2252 10/29/13 0125 10/29/13 0300 10/29/13 1433  BP: 136/55  126/63 132/62  Pulse: 76 75  80  Temp: 98.4 F (36.9 C)  98.3 F (36.8 C) 98.3 F (36.8 C)  TempSrc: Oral  Oral Oral  Resp: 18 16 18 20   Height: 5\' 6"  (1.676 m)     Weight: 111.7 kg (246 lb 4.1 oz)     SpO2: 93% 94% 97% 95%    Intake/Output Summary (Last 24 hours) at 10/29/13 1806 Last data filed at 10/29/13 1704  Gross per 24 hour  Intake 1954.25 ml  Output   1750 ml  Net 204.25 ml    Exam:   General:  Pt is alert, follows commands appropriately, not in acute distress  Cardiovascular: Regular rate and rhythm, S1/S2, no murmurs, no rubs, no gallops  Respiratory: Clear to auscultation bilaterally, no wheezing, no crackles, no rhonchi  Abdomen: Soft, non tender, non distended, bowel sounds present, no guarding  Extremities: No edema, pulses DP and PT palpable bilaterally  Data Reviewed: Basic Metabolic Panel:  Recent Labs Lab 10/28/13 1809 10/29/13 0750  NA 134* 135*  K 3.7 3.7  CL 94* 97  CO2 24 25  GLUCOSE 132* 189*  BUN 20 16  CREATININE 0.51 0.48*  CALCIUM 9.1 8.3*   Liver Function Tests:  Recent Labs Lab 10/28/13 1809  AST 33  ALT 22  ALKPHOS 59  BILITOT 0.5  PROT 7.8  ALBUMIN 3.4*     Recent Labs Lab 10/28/13 1809  LIPASE 37   CBC:  Recent Labs Lab 10/28/13 1809 10/29/13 0750  WBC 15.4* 12.2*  NEUTROABS 11.5*  --   HGB 14.7 13.5  HCT 45.0 40.9  MCV 87.5 86.7  PLT 220 222   CBG:  Recent Labs Lab 10/28/13 2241 10/29/13 0721 10/29/13 1119 10/29/13 1649  GLUCAP 111* 122* 176* 76   Scheduled Meds: . Alogliptin-Metformin HCl  1 tablet Oral BID  . atorvastatin  40 mg Oral q1800  . Canagliflozin  100 mg Oral Daily  . cefTRIAXone (ROCEPHIN)  IV  2 g Intravenous Q24H  . fenofibrate  160 mg Oral Daily  . furosemide  20 mg Oral BID  . glimepiride  2 mg Oral Q supper  . lisinopril  5 mg Oral Daily  . metoprolol tartrate  12.5 mg Oral BID  . mometasone-formoterol  2 puff Inhalation BID  . potassium chloride  10 mEq Oral BID  . sodium chloride  3 mL Intravenous Q12H  . vancomycin  1,000 mg Intravenous Q12H   Continuous Infusions: . heparin 1,650 Units/hr (10/29/13 1217)   Heidi Blaze, MD  [facility] Pager [phone]  If 7PM-7AM, please contact night-coverage [email or web address] Password [email or web address] 10/29/2013, 6:06 PM   LOS: 1 day

## 2013-10-29 NOTE — Care Management Note (Addendum)
    Page 1 of 1   11/03/2013     4:33:07 PM CARE MANAGEMENT NOTE 11/03/2013  Patient:  LY, WASS   Account Number:  [id number]  Date Initiated:  10/29/2013  Documentation initiated by:  Dessa Phi  Subjective/Objective Assessment:   73 Y/O F ADMITTED W/ABD PAIN,NAUSEA.SPLENIC INFARCTS.     Action/Plan:   FROM HOME.   Anticipated DC Date:  11/03/2013   Anticipated DC Plan:  [place]  CM consult      Choice offered to / List presented to:  C-1 Patient        [place] arranged  HH-1 RN  [place]      [place].   Status of service:  Completed, signed off Medicare Important Message given?   (If response is "NO", the following Medicare IM given date fields will be blank) Date Medicare IM given:   Date Additional Medicare IM given:    Discharge Disposition:  [place]  Per UR Regulation:  Reviewed for med. necessity/level of care/duration of stay  If discussed at Long Length of Stay Meetings, dates discussed:    Comments:  11/03/13 Aeon Kessner RN,BSN NCM [address] PT/INR CALL RESULTS TO CARDIO/HHPT/HH NURSE'S AIDE-KRISTEN [facility] REP AWARE OF D/C & HHC ORDERS.  10/29/13 Tasheem Elms RN,BSN NCM [place]

## 2013-10-29 NOTE — Progress Notes (Signed)
PHARMACY BRIEF NOTE - Anticoagulation  Consult for:  IV Heparin Indication:  Acute splenic and right renal infarcts  With the infusion of 1650 units/hr, the Heparin level drawn at 16:42 today is reported as 0.1 units/ml.  This level is below the therapeutic range, 0.3-0.7 units/ml.  The patient's nurse is not aware of any problems or interruptions with the Heparin infusion.  Plan:  Increase the infusion to 2000 units/hr.  Repeat the level in 6 hours  [employer].Ph. 10/29/2013 7:16 PM

## 2013-10-29 NOTE — Progress Notes (Signed)
ANTICOAGULATION CONSULT NOTE - Follow Up Consult  Pharmacy Consult for Heparin Indication: acute splenic and right renal infarcts   Allergies  Allergen Reactions  . Sulfonamide Derivatives     Stomach cramps  . Neomycin-Bacitracin Zn-Polymyx Rash    Patient Measurements: Height: 5\' 6"  (167.6 cm) Weight: 246 lb 4.1 oz (111.7 kg) IBW/kg (Calculated) : 59.3 Heparin Dosing Weight: 85 kg  Vital Signs: Temp: 98.3 F (36.8 C) (04/15 0300) Temp src: Oral (04/15 0300) BP: 126/63 mmHg (04/15 0300) Pulse Rate: 75 (04/15 0125)  Labs:  Recent Labs  10/28/13 1809 10/28/13 2202  HGB 14.7  --   HCT 45.0  --   PLT 220  --   APTT  --  33  LABPROT  --  13.1  INR  --  1.01  CREATININE 0.51  --     Estimated Creatinine Clearance: 81.8 ml/min (by C-G formula based on Cr of 0.51).   Medications:  Infusions:  . heparin 1,300 Units/hr (10/28/13 2321)    Assessment: 57 yoF [facility] ED on 4/14 with abdominal pain and nausea.  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  No PMH of clotting disorder or anticoagulation, but daughter has lupus anticoagulant and factor V leiden with hx thrombus.  Pharmacy consulted to dose IV heparin.  Baseline coags: INR 1.01 and APTT 33  CBC: Hgb 13.5, Plt 222  Heparin level: < 0.1, below therapeutic range.  Hypercoagulability panel in process   Goal of Therapy:  Heparin level 0.3-0.7 units/ml Monitor platelets by anticoagulation protocol: Yes   Plan:   Give heparin 2500 units bolus IV x 1  Increase to heparin IV infusion at 1650 units/hr  Heparin level 8 hours after starting  Daily heparin level and CBC  Continue to monitor H&H and platelets  Gretta Arab PharmD, BCPS Pager [phone] 10/29/2013 8:04 AM

## 2013-10-30 ENCOUNTER — Inpatient Hospital Stay (HOSPITAL_COMMUNITY): Payer: Medicare Other

## 2013-10-30 DIAGNOSIS — I517 Cardiomegaly: Secondary | ICD-10-CM

## 2013-10-30 DIAGNOSIS — L0231 Cutaneous abscess of buttock: Secondary | ICD-10-CM

## 2013-10-30 DIAGNOSIS — L03317 Cellulitis of buttock: Secondary | ICD-10-CM

## 2013-10-30 LAB — BASIC METABOLIC PANEL
BUN: 15 mg/dL (ref 6–23)
CO2: 29 mEq/L (ref 19–32)
CREATININE: 0.61 mg/dL (ref 0.50–1.10)
Calcium: 8.4 mg/dL (ref 8.4–10.5)
Chloride: 99 mEq/L (ref 96–112)
GFR calc Af Amer: >90 mL/min (ref >90)
GFR, EST NON AFRICAN AMERICAN: 89 mL/min — AB (ref >90)
GLUCOSE: 121 mg/dL — AB (ref 70–99)
Potassium: 3.4 mEq/L — ABNORMAL LOW (ref 3.7–5.3)
Sodium: 139 mEq/L (ref 137–147)

## 2013-10-30 LAB — CBC
HCT: 39 % (ref 36.0–46.0)
Hemoglobin: 12.5 g/dL (ref 12.0–15.0)
MCH: 28.7 pg (ref 26.0–34.0)
MCHC: 32.1 g/dL (ref 30.0–36.0)
MCV: 89.4 fL (ref 78.0–100.0)
Platelets: 233 10*3/uL (ref 150–400)
RBC: 4.36 MIL/uL (ref 3.87–5.11)
RDW: 15.3 % (ref 11.5–15.5)
WBC: 10.9 10*3/uL — ABNORMAL HIGH (ref 4.0–10.5)

## 2013-10-30 LAB — GLUCOSE, CAPILLARY
GLUCOSE-CAPILLARY: 123 mg/dL — AB (ref 70–99)
GLUCOSE-CAPILLARY: 124 mg/dL — AB (ref 70–99)
Glucose-Capillary: 129 mg/dL — ABNORMAL HIGH (ref 70–99)

## 2013-10-30 LAB — VANCOMYCIN, TROUGH: VANCOMYCIN TR: 7 ug/mL — AB (ref 10.0–20.0)

## 2013-10-30 LAB — HEMOGLOBIN A1C
HEMOGLOBIN A1C: 6.5 % — AB (ref <5.7)
Mean Plasma Glucose: 140 mg/dL — ABNORMAL HIGH (ref <117)

## 2013-10-30 LAB — HEPARIN LEVEL (UNFRACTIONATED)
HEPARIN UNFRACTIONATED: 0.39 [IU]/mL (ref 0.30–0.70)
Heparin Unfractionated: 0.22 IU/mL — ABNORMAL LOW (ref 0.30–0.70)
Heparin Unfractionated: 0.19 IU/mL — ABNORMAL LOW (ref 0.30–0.70)

## 2013-10-30 MED ORDER — HEPARIN (PORCINE) IN NACL 100-0.45 UNIT/ML-% IJ SOLN
2100.0000 [IU]/h | INTRAMUSCULAR | Status: DC
  Administered 2013-10-30: 2100 [IU]/h via INTRAVENOUS
  Filled 2013-10-30 (×2): qty 250

## 2013-10-30 MED ORDER — METOPROLOL TARTRATE 25 MG/10 ML ORAL SUSPENSION
6.2500 mg | Freq: Two times a day (BID) | ORAL | Status: DC
  Filled 2013-10-30 (×2): qty 2.5

## 2013-10-30 MED ORDER — ALBUTEROL SULFATE (2.5 MG/3ML) 0.083% IN NEBU
2.5000 mg | INHALATION_SOLUTION | Freq: Four times a day (QID) | RESPIRATORY_TRACT | Status: DC
  Administered 2013-10-30 – 2013-11-01 (×4): 2.5 mg via RESPIRATORY_TRACT
  Filled 2013-10-30 (×5): qty 3

## 2013-10-30 MED ORDER — HEPARIN BOLUS VIA INFUSION
3000.0000 [IU] | Freq: Once | INTRAVENOUS | Status: AC
  Administered 2013-10-30: 3000 [IU] via INTRAVENOUS
  Filled 2013-10-30: qty 3000

## 2013-10-30 MED ORDER — METOPROLOL TARTRATE 25 MG/10 ML ORAL SUSPENSION
6.2500 mg | Freq: Two times a day (BID) | ORAL | Status: DC
  Administered 2013-10-30 – 2013-11-01 (×6): 6.25 mg via ORAL
  Filled 2013-10-30 (×8): qty 2.5

## 2013-10-30 MED ORDER — VANCOMYCIN HCL IN DEXTROSE 1-5 GM/200ML-% IV SOLN
1000.0000 mg | Freq: Once | INTRAVENOUS | Status: AC
  Administered 2013-10-31: 1000 mg via INTRAVENOUS
  Filled 2013-10-30: qty 200

## 2013-10-30 MED ORDER — VANCOMYCIN HCL 10 G IV SOLR
1500.0000 mg | Freq: Two times a day (BID) | INTRAVENOUS | Status: DC
  Administered 2013-10-31 – 2013-11-03 (×7): 1500 mg via INTRAVENOUS
  Filled 2013-10-30 (×8): qty 1500

## 2013-10-30 MED ORDER — ALBUTEROL SULFATE (2.5 MG/3ML) 0.083% IN NEBU: 2.5000 mg | INHALATION_SOLUTION | RESPIRATORY_TRACT | Status: DC | PRN

## 2013-10-30 MED ORDER — POTASSIUM CHLORIDE CRYS ER 20 MEQ PO TBCR
40.0000 meq | EXTENDED_RELEASE_TABLET | Freq: Once | ORAL | Status: AC
  Administered 2013-10-30: 40 meq via ORAL
  Filled 2013-10-30: qty 2

## 2013-10-30 MED ORDER — LORAZEPAM 2 MG/ML IJ SOLN
0.5000 mg | Freq: Two times a day (BID) | INTRAMUSCULAR | Status: DC | PRN
  Administered 2013-10-30 – 2013-11-02 (×5): 0.5 mg via INTRAVENOUS
  Filled 2013-10-30 (×5): qty 1

## 2013-10-30 MED ORDER — FUROSEMIDE 10 MG/ML IJ SOLN
20.0000 mg | Freq: Once | INTRAMUSCULAR | Status: AC
  Administered 2013-10-30: 20 mg via INTRAVENOUS
  Filled 2013-10-30: qty 2

## 2013-10-30 MED ORDER — HEPARIN (PORCINE) IN NACL 100-0.45 UNIT/ML-% IJ SOLN
2400.0000 [IU]/h | INTRAMUSCULAR | Status: DC
  Administered 2013-10-30 – 2013-11-03 (×9): 2400 [IU]/h via INTRAVENOUS
  Filled 2013-10-30 (×11): qty 250

## 2013-10-30 NOTE — Progress Notes (Signed)
Patient ID: Heidi Stark, female   DOB: July 20, 1940, 73 y.o.   MRN: [id number]  [facility] PROGRESS NOTE  Heidi Stark WUJ:[id number] DOB: 05-Jul-1941 DOA: 10/28/2013 PCP: Garnet Koyanagi, DO  Brief narrative:  73 y.o. female presented to ED with L sided abdominal pain that started 2-3 days prior to this admission, sharp and constant, 7/10 in severity, worse with movement and less intense with rest, non radiating, associated with nausea, poor oral intake, subjective fevers, chills.   In ED, imagining studies notable for acute splenic and right renal infarcts suspicious for arterial embolic source.   Principal Problem:  Splenic infarct, right renal infarct  - unclear etiology at this time  - given pt's fevers on admission and with leukocytosis, endocarditis is certainly in differential  - ? Atherosclerotic disease  - 2 D ECHO with no clear embolic source identified and with normal systolic function  - plan for TEE  - continue heparin for now  Active Problems:  Acute respiratory failure - pt has more shortness of breath and wheezing with expiration, she has history of COPD - probably not PE even though possible, but pt is on heparin gtt - will place on BD's scheduled and as needed - CXR with mild CHF, will add one dose of Lasix 20 mg IV in addition to her scheduled lasix 20 mg PO BID  - weigh today is 246 lbs, will monitor daily weights, strict I's and O's Leukocytosis  - unclear etiology, UA suggestive of UTI but again endocarditis is in differential, CXR with bronchitic changes but no overt developing PNA  - pt is currently on Vancomycin and Rocephin day #3 - will follow up on urine and blood culture  - WBC is trending down, repeat CBC in AM  DIABETES MELLITUS, TYPE II  - A1C 6.5, indicative of good diabetic control  - continue Amaryl, also placed on SSI while inpatient  HYPERTENSION  - SBP in 170's, currently on lasix 20 mg PO BID, metoprolol 6.25 mg BID, lisinopril 5 mg QD -  will monitor closely, will add one extra dose of Lasix 20 mg IV in addition to the regimen above - will need to readjust the regimen if BP remains uncontrolled   Consultants:  Cardiology  Procedures/Studies:  Dg Chest 2 View 10/28/2013 Stable cardiomegaly, chronic bronchitic changes and interstitial prominence without evidence of acute cardiopulmonary disease.  Ct Abdomen Pelvis W Contrast 10/28/2013 1. 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 Valvular calcifications in the mitral and aortic valve may support a valvular source - echocardiography is likely warranted. 2. 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 3. Hepatomegaly. 4. Cardiomegaly. 5. 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 6. Lumbar spondylosis and degenerative disc disease, with severe right foraminal stenosis at L5-S1. 7. 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 No current findings of strangulation. 8. Nonobstructive left nephrolithiasis. Antibiotics:  Vancomycin 4/14 -->  Rocephin 4/14 -->   Code Status: Full  Family Communication: Pt at bedside  Disposition Plan: Home when medically stable   HPI/Subjective: No events overnight.   Objective: Filed Vitals:   10/30/13 0800 10/30/13 0857 10/30/13 1041 10/30/13 1410  BP: 160/74  151/73 172/60  Pulse: 73  92 72  Temp: 98.2 F (36.8 C)   98 F (36.7 C)  TempSrc: Oral   Oral  Resp: 20  18  Height:      Weight:      SpO2: 95% 93%  98%    Intake/Output Summary (Last 24 hours) at 10/30/13 1738 Last data filed at 10/30/13 1500  Gross per 24 hour  Intake 1419.94 ml  Output   1400 ml  Net  19.94 ml    Exam:   General:  Pt is alert, follows commands appropriately, not in acute distress  Cardiovascular: Regular rate and rhythm, S1/S2, no murmurs, no  rubs, no gallops  Respiratory: Slightly tachypneic with RR in mid 20's, bibasilar crackles and diminished air movement at bases   Abdomen: Soft, non tender, non distended, bowel sounds present, no guarding  Extremities: No edema, pulses DP and PT palpable bilaterally  Neuro: Grossly nonfocal  Data Reviewed: Basic Metabolic Panel:  Recent Labs Lab 10/28/13 1809 10/29/13 0750 10/30/13 0100  NA 134* 135* 139  K 3.7 3.7 3.4*  CL 94* 97 99  CO2 24 25 29   GLUCOSE 132* 189* 121*  BUN 20 16 15   CREATININE 0.51 0.48* 0.61  CALCIUM 9.1 8.3* 8.4   Liver Function Tests:  Recent Labs Lab 10/28/13 1809  AST 33  ALT 22  ALKPHOS 59  BILITOT 0.5  PROT 7.8  ALBUMIN 3.4*    Recent Labs Lab 10/28/13 1809  LIPASE 37   CBC:  Recent Labs Lab 10/28/13 1809 10/29/13 0750 10/30/13 0100  WBC 15.4* 12.2* 10.9*  NEUTROABS 11.5*  --   --   HGB 14.7 13.5 12.5  HCT 45.0 40.9 39.0  MCV 87.5 86.7 89.4  PLT 220 222 233   CBG:  Recent Labs Lab 10/29/13 1119 10/29/13 1649 10/30/13 0654 10/30/13 1127 10/30/13 1707  GLUCAP 176* 76 123* 129* 124*    Recent Results (from the past 240 hour(s))  CULTURE, BLOOD (ROUTINE X 2)     Status: None   Collection Time    10/28/13  9:35 PM      Result Value Ref Range Status   Specimen Description BLOOD LEFT ANTECUBITAL   Final   Special Requests BOTTLES DRAWN AEROBIC AND ANAEROBIC 5CC   Final   Culture  Setup Time     Final   Value: 10/29/2013 00:39     Performed at [facility]   Culture     Final   Value:        BLOOD CULTURE RECEIVED NO GROWTH TO DATE CULTURE WILL BE HELD FOR 5 DAYS BEFORE ISSUING A FINAL NEGATIVE REPORT     Performed at [facility]   Report Status PENDING   Incomplete  CULTURE, BLOOD (ROUTINE X 2)     Status: None   Collection Time    10/28/13  9:40 PM      Result Value Ref Range Status   Specimen Description BLOOD BLOOD LEFT FOREARM   Final   Special Requests BOTTLES DRAWN AEROBIC AND  ANAEROBIC 3CC   Final   Culture  Setup Time     Final   Value: 10/29/2013 00:39     Performed at [facility]   Culture     Final   Value:        BLOOD CULTURE RECEIVED NO GROWTH TO DATE CULTURE WILL BE HELD FOR 5 DAYS BEFORE ISSUING A FINAL NEGATIVE REPORT     Performed at [facility]   Report Status PENDING   Incomplete     Scheduled Meds: . albuterol  2.5 mg Nebulization QID  . Alogliptin-Metformin HCl  1 tablet Oral BID  . atorvastatin  40 mg Oral q1800  . Canagliflozin  100 mg Oral Daily  . cefTRIAXone (ROCEPHIN)  IV  2 g Intravenous Q24H  . fenofibrate  160 mg Oral Daily  . furosemide  20 mg Oral BID  . glimepiride  2 mg Oral Q supper  . insulin aspart  0-9 Units Subcutaneous TID WC  . lisinopril  5 mg Oral Daily  . metoprolol tartrate  6.25 mg Oral BID  . mometasone-formoterol  2 puff Inhalation BID  . potassium chloride  10 mEq Oral BID  . sodium chloride  3 mL Intravenous Q12H  . vancomycin  1,000 mg Intravenous Q12H   Continuous Infusions: . heparin 2,400 Units/hr (10/30/13 1340)   [name], MD  [facility] Pager [phone]  If 7PM-7AM, please contact night-coverage [email or web address] Password [email or web address] 10/30/2013, 5:38 PM   LOS: 2 days

## 2013-10-30 NOTE — Progress Notes (Signed)
ANTICOAGULATION CONSULT NOTE - Follow Up Consult  Pharmacy Consult for Heparin Indication: Acute splenic and right renal infarcts   Allergies  Allergen Reactions  . Sulfonamide Derivatives     Stomach cramps  . Neomycin-Bacitracin Zn-Polymyx Rash    Patient Measurements: Height: 5\' 6"  (167.6 cm) Weight: 246 lb 4.1 oz (111.7 kg) IBW/kg (Calculated) : 59.3 Heparin Dosing Weight:   Vital Signs: Temp: 97.6 F (36.4 C) (04/15 2028) Temp src: Oral (04/15 2028) BP: 147/61 mmHg (04/15 2028) Pulse Rate: 82 (04/15 2252)  Labs:  Recent Labs  10/28/13 1809 10/28/13 2202 10/29/13 0750 10/29/13 1642 10/30/13 0100  HGB 14.7  --  13.5  --  12.5  HCT 45.0  --  40.9  --  39.0  PLT 220  --  222  --  233  APTT  --  33  --   --   --   LABPROT  --  13.1  --   --   --   INR  --  1.01  --   --   --   HEPARINUNFRC  --   --  <0.10* 0.10* 0.22*  CREATININE 0.51  --  0.48*  --  0.61    Estimated Creatinine Clearance: 81.8 ml/min (by C-G formula based on Cr of 0.61).   Medications:  Infusions:  . heparin 2,100 Units/hr (10/30/13 0148)    Assessment: Patient with low heparin level.  No issues per RN.  Goal of Therapy:  Heparin level 0.3-0.7 units/ml Monitor platelets by anticoagulation protocol: Yes   Plan:  Increase heparin to 2100 units/hr Recheck level at [place]. 10/30/2013,2:58 AM

## 2013-10-30 NOTE — Progress Notes (Signed)
ANTICOAGULATION CONSULT NOTE - Follow Up Consult  Pharmacy Consult for Heparin Indication: acute splenic and right renal infarcts   Allergies  Allergen Reactions  . Sulfonamide Derivatives     Stomach cramps  . Neomycin-Bacitracin Zn-Polymyx Rash    Patient Measurements: Height: 5\' 6"  (167.6 cm) Weight: 246 lb 4.1 oz (111.7 kg) IBW/kg (Calculated) : 59.3 Heparin Dosing Weight: 85 kg  Vital Signs: Temp: 98.4 F (36.9 C) (04/16 0541) Temp src: Oral (04/16 0541) BP: 151/73 mmHg (04/16 1041) Pulse Rate: 92 (04/16 1041)  Labs:  Recent Labs  10/28/13 1809 10/28/13 2202  10/29/13 0750 10/29/13 1642 10/30/13 0100 10/30/13 0953  HGB 14.7  --   --  13.5  --  12.5  --   HCT 45.0  --   --  40.9  --  39.0  --   PLT 220  --   --  222  --  233  --   APTT  --  33  --   --   --   --   --   LABPROT  --  13.1  --   --   --   --   --   INR  --  1.01  --   --   --   --   --   HEPARINUNFRC  --   --   < > <0.10* 0.10* 0.22* 0.19*  CREATININE 0.51  --   --  0.48*  --  0.61  --   < > = values in this interval not displayed.  Estimated Creatinine Clearance: 81.8 ml/min (by C-G formula based on Cr of 0.61).   Medications:  Infusions:  . heparin 2,100 Units/hr (10/30/13 0148)    Assessment: 51 yoF presented to [facility] ED on 4/14 with abdominal pain and nausea.  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  No PMH of clotting disorder or anticoagulation, but daughter has lupus anticoagulant and factor V leiden with hx thrombus.  Pharmacy consulted to dose IV heparin.  Baseline coags WNL  CBC: Hgb 12.5, Plt 233  Heparin level: 0.19, remains below goal level  Hypercoagulability panel in process   Goal of Therapy:  Heparin level 0.3-0.7 units/ml Monitor platelets by anticoagulation protocol: Yes   Plan:   Heparin bolus 3000 units IV x1  Increase to heparin IV infusion at 2400 units/hr  Heparin level 8 hours after rate  change  Daily heparin level and CBC  Continue to monitor H&H and platelets  Gretta Arab PharmD, BCPS Pager [phone] 10/30/2013 11:19 AM

## 2013-10-30 NOTE — Progress Notes (Signed)
ANTICOAGULATION CONSULT NOTE - Follow Up Consult   Pharmacy Consult for Heparin  Indication: acute splenic and right renal infarcts   See previous progress note from Gretta Arab, PharmD for full details.  Heparin level therapeutic (0.39) on 2400 units/hr. No bleeding reported.  Plan:  Continue current heparin rate  F/u heparin level in am to confirm therapeutic   Peggyann Juba, PharmD, [place] [phone] 10/30/2013 9:39 PM

## 2013-10-30 NOTE — Progress Notes (Signed)
ANTIBIOTIC CONSULT NOTE - FOLLOW UP  Pharmacy Consult for Stark Indication: Heidi endocarditis  Allergies  Allergen Reactions  . Sulfonamide Derivatives     Stomach cramps  . Neomycin-Bacitracin Zn-Polymyx Rash    Patient Measurements: Height: 5\' 6"  (167.6 cm) Weight: 246 lb 4.1 oz (111.7 kg) IBW/kg (Calculated) : 59.3  Vital Signs: Temp: 98.4 F (36.9 C) (04/16 0541) Temp src: Oral (04/16 0541) BP: 148/47 mmHg (04/16 0541) Pulse Rate: 64 (04/16 0541) Intake/Output from previous day: 04/15 0701 - 04/16 0700 In: 1797.8 [P.O.:1200; I.V.:67.8; IV Piggyback:450] Out: 2350 [Urine:2350]  Labs:  Recent Labs  10/28/13 1809 10/29/13 0750 10/30/13 0100  WBC 15.4* 12.2* 10.9*  HGB 14.7 13.5 12.5  PLT 220 222 233  CREATININE 0.51 0.48* 0.61   Estimated Creatinine Clearance: 81.8 ml/min (by C-G formula based on Cr of 0.61). No results found for this basename: VANCOTROUGH, VANCOPEAK, VANCORANDOM, GENTTROUGH, GENTPEAK, GENTRANDOM, TOBRATROUGH, TOBRAPEAK, TOBRARND, AMIKACINPEAK, AMIKACINTROU, AMIKACIN,  in the last 72 hours   Microbiology: Recent Results (from the past 720 hour(s))  CULTURE, BLOOD (ROUTINE X 2)     Status: None   Collection Time    10/28/13  9:35 PM      Result Value Ref Range Status   Specimen Description BLOOD LEFT ANTECUBITAL   Final   Special Requests BOTTLES DRAWN AEROBIC AND ANAEROBIC 5CC   Final   Culture  Setup Time     Final   Value: 10/29/2013 00:39     Performed at [facility]   Culture     Final   Value:        BLOOD CULTURE RECEIVED NO GROWTH TO DATE CULTURE WILL BE HELD FOR 5 DAYS BEFORE ISSUING A FINAL NEGATIVE REPORT     Performed at [facility]   Report Status PENDING   Incomplete  CULTURE, BLOOD (ROUTINE X 2)     Status: None   Collection Time    10/28/13  9:40 PM      Result Value Ref Range Status   Specimen Description BLOOD BLOOD LEFT FOREARM   Final   Special Requests BOTTLES DRAWN AEROBIC AND ANAEROBIC 3CC    Final   Culture  Setup Time     Final   Value: 10/29/2013 00:39     Performed at [facility]   Culture     Final   Value:        BLOOD CULTURE RECEIVED NO GROWTH TO DATE CULTURE WILL BE HELD FOR 5 DAYS BEFORE ISSUING A FINAL NEGATIVE REPORT     Performed at [facility]   Report Status PENDING   Incomplete    Anti-infectives: 4/14 >> Stark >>  4/14 >> Rocephin  >>    Heidi Stark, Heidi Stark, Heidi dosing per MD.  Day #3 Stark and Heidi.  Tmax: afeb  WBC: decreasing, 10.9  Renal:  Scr 0.61, CrCl ~ 82 CG  Blood and urine cultures in process.   Goal of Therapy:  Stark trough level 15-20 mcg/ml  Plan:   Continue Stark 1g IV q12h.  Measure Vanc trough at steady state - 2100 tonight.  Follow up renal fxn, echo results, culture results.  Gretta Arab PharmD, BCPS Pager [phone] 10/30/2013 8:50 AM

## 2013-10-30 NOTE — Progress Notes (Addendum)
Patient Name: Heidi Stark Date of Encounter: 10/30/2013     Principal Problem:   Splenic infarct Active Problems:   DIABETES MELLITUS, TYPE II   HYPERTENSION   Renal infarct    SUBJECTIVE  No chest pain. She feels a little more dyspneic this am. No fever. 2D echo not yet done.  CURRENT MEDS . Alogliptin-Metformin HCl  1 tablet Oral BID  . atorvastatin  40 mg Oral q1800  . Canagliflozin  100 mg Oral Daily  . cefTRIAXone (ROCEPHIN)  IV  2 g Intravenous Q24H  . fenofibrate  160 mg Oral Daily  . furosemide  20 mg Oral BID  . glimepiride  2 mg Oral Q supper  . insulin aspart  0-9 Units Subcutaneous TID WC  . lisinopril  5 mg Oral Daily  . metoprolol tartrate  12.5 mg Oral BID  . mometasone-formoterol  2 puff Inhalation BID  . potassium chloride  10 mEq Oral BID  . sodium chloride  3 mL Intravenous Q12H  . vancomycin  1,000 mg Intravenous Q12H    OBJECTIVE  Filed Vitals:   10/29/13 1433 10/29/13 2028 10/29/13 2252 10/30/13 0541  BP: 132/62 147/61  148/47  Pulse: 80 69 82 64  Temp: 98.3 F (36.8 C) 97.6 F (36.4 C)  98.4 F (36.9 C)  TempSrc: Oral Oral  Oral  Resp: 20 20 18 16   Height:      Weight:      SpO2: 95% 97%  98%    Intake/Output Summary (Last 24 hours) at 10/30/13 0830 Last data filed at 10/30/13 0700  Gross per 24 hour  Intake 1437.8 ml  Output   2350 ml  Net -912.2 ml   Filed Weights   10/28/13 2152 10/28/13 2252  Weight: 260 lb (117.935 kg) 246 lb 4.1 oz (111.7 kg)    PHYSICAL EXAM  General: Pleasant, NAD. Neuro: Alert and oriented X 3. Moves all extremities spontaneously. Psych: Normal affect. HEENT:  Normal  Neck: Supple without bruits or JVD. Lungs:  Mild expiratory wheezes and rhonchi. Heart: RRR no s3, s4, or murmurs. Abdomen: Soft, non-tender, non-distended, BS + x 4.  Extremities: No clubbing, cyanosis or edema. DP/PT/Radials 2+ and equal bilaterally.  Accessory Clinical Findings  CBC  Recent Labs  10/28/13 1809  10/29/13 0750 10/30/13 0100  WBC 15.4* 12.2* 10.9*  NEUTROABS 11.5*  --   --   HGB 14.7 13.5 12.5  HCT 45.0 40.9 39.0  MCV 87.5 86.7 89.4  PLT 220 222 341   Basic Metabolic Panel  Recent Labs  10/29/13 0750 10/30/13 0100  NA 135* 139  K 3.7 3.4*  CL 97 99  CO2 25 29  GLUCOSE 189* 121*  BUN 16 15  CREATININE 0.48* 0.61  CALCIUM 8.3* 8.4   Liver Function Tests  Recent Labs  10/28/13 1809  AST 33  ALT 22  ALKPHOS 59  BILITOT 0.5  PROT 7.8  ALBUMIN 3.4*    Recent Labs  10/28/13 1809  LIPASE 37   Cardiac Enzymes No results found for this basename: CKTOTAL, CKMB, CKMBINDEX, TROPONINI,  in the last 72 hours BNP No components found with this basename: POCBNP,  D-Dimer No results found for this basename: DDIMER,  in the last 72 hours Hemoglobin A1C  Recent Labs  10/30/13 0100  HGBA1C 6.5*   Fasting Lipid Panel No results found for this basename: CHOL, HDL, LDLCALC, TRIG, CHOLHDL, LDLDIRECT,  in the last 72 hours Thyroid Function Tests No results found for  this basename: TSH, T4TOTAL, FREET3, T3FREE, THYROIDAB,  in the last 72 hours  TELE  NSR with PVCs  ECG    Radiology/Studies  Dg Chest 2 View  10/28/2013   CLINICAL DATA:  Short of breath, fever  EXAM: CHEST  2 VIEW  COMPARISON:  Prior chest x-ray 09/03/2009  FINDINGS: Stable mild cardiomegaly. Atherosclerotic calcifications noted in the transverse and descending thoracic aorta. Similar bronchitic changes and interstitial prominence particularly in the bilateral lung bases compared to prior. No focal airspace consolidation, pleural effusion or pneumothorax. No acute osseous abnormality.  IMPRESSION: Stable cardiomegaly, chronic bronchitic changes and interstitial prominence without evidence of acute cardiopulmonary disease.   Electronically Signed   By: Jacqulynn Cadet M.D.   On: 10/28/2013 19:05   Ct Abdomen Pelvis W Contrast  10/28/2013   CLINICAL DATA:  Mid abdominal pain. Fatigue. Left lower  abdominal pain.  EXAM: CT ABDOMEN AND PELVIS WITH CONTRAST  TECHNIQUE: Multidetector CT imaging of the abdomen and pelvis was performed using the standard protocol following bolus administration of intravenous contrast.  CONTRAST:  172mL OMNIPAQUE IOHEXOL 300 MG/ML  SOLN  COMPARISON:  US RENAL dated 08/12/2012; CT CHEST W/CM dated 08/14/2006  FINDINGS: Peripheral interstitial accentuation in the lung bases noted, increased from 08/14/2006. Mildly prominent pulmonary vasculature in the lower lobes.  Cardiomegaly observed with calcification of the mitral valve and aortic valve.  There is a new band of abnormal hypodensity in the spleen which persists on delayed images, compatible with splenic infarct. I estimate that this involves about 40% of the splenic volume.  Mild lobularity of the anterior margin of the left hepatic lobe.  1.6 x 4.0 cm left adrenal mass, portal venous phase image density 36 Hounsfield units, delayed density 26 Hounsfield units, relative washout 28% (indeterminate) however, similar appearance noted on prior chest CT.  Wedge shaped the hypodensities in the right kidney lower pole persist on delayed images and are suspicious for infarcts.  3 mm left kidney lower pole nonobstructive calculus. No hydronephrosis or hydroureter. Urinary bladder unremarkable.  A small right paracentral Richter hernia is observed on image 31 of series 2, containing a small margin of the transverse colon, without current findings of strangulation.  Aortoiliac atherosclerotic vascular disease. Left external iliac artery appears severely thinned and attenuated, Re constituting at the level of the common femoral artery No pathologic upper abdominal adenopathy is observed. No pathologic pelvic adenopathy is observed. No dilated bowel observed. Uterine and adnexal contours unremarkable.  Considerable lumbar spondylosis and degenerative disc disease noted. Hepatomegaly is present.  Mildly mobile cecum noted with the cecum in the  mid abdomen. Appendix normal.  IMPRESSION: 1. 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 Valvular calcifications in the mitral and aortic valve may support a valvular source - echocardiography is likely warranted. 2. 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 3. Hepatomegaly. 4. Cardiomegaly. 5. 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 6. Lumbar spondylosis and degenerative disc disease, with severe right foraminal stenosis at L5-S1. 7. 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 No current findings of strangulation. 8. Nonobstructive left nephrolithiasis.  Critical Value/emergent results were called by telephone at the time of interpretation on 10/28/2013 at 8:59 PM to Dr. Sherwood Gambler , who verbally acknowledged these results.   Electronically Signed   By: Sherryl Barters M.D.   On: 10/28/2013 21:02  ASSESSMENT AND PLAN 1. 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 Source. Will get 2D echo and consider also a TEE if nothing definitive seen on 2D echo. Source could be endocarditis but no real risk factors and no prior history of valve problems. Source may also be from atherosclerotic plaque in the aorta, for which she has multiple risk factors.  2. Diabetes.  3. COPD  4. Hypertension  5. Dyslipidemia.  2D echo to be done today  Signed, Darlin Coco MD  Addendum: The two-dimensional echocardiogram is not of sufficient quality to exclude a cardiac source for his systemic emboli.  We will plan to proceed with a TEE tomorrow.  Will keep n.p.o. after midnight tonight.

## 2013-10-30 NOTE — Progress Notes (Signed)
ANTIBIOTIC CONSULT NOTE - FOLLOW UP   Pharmacy Consult for Vancomycin  Indication: r/o endocarditis  See previous progress note from Gretta Arab, PharmD for full details.  Vancomycin trough subtherapeutic (7.0 mcg/ml) before 6th total dose.  Plan:  Give an additional 1g IV vancomycin now  Increase vancomycin to 1500mg  IV q12h and recheck trough at steady state  Peggyann Juba, PharmD, [place] [phone] 10/30/2013 10:02 PM

## 2013-10-30 NOTE — Progress Notes (Signed)
Called [facility] and arranged for patient to be transported over to [facility] tomorrow morning for a TEE. [facility] will pick up here at 9:30am.

## 2013-10-31 ENCOUNTER — Encounter (HOSPITAL_COMMUNITY): Payer: Self-pay | Admitting: *Deleted

## 2013-10-31 ENCOUNTER — Ambulatory Visit (HOSPITAL_COMMUNITY): Admission: RE | Admit: 2013-10-31 | Payer: Medicare Other | Source: Ambulatory Visit | Admitting: Cardiology

## 2013-10-31 ENCOUNTER — Encounter (HOSPITAL_COMMUNITY)
Admission: EM | Disposition: A | Discharge status: Home Health | Payer: Self-pay | Source: Home / Self Care | Attending: Internal Medicine

## 2013-10-31 DIAGNOSIS — I517 Cardiomegaly: Secondary | ICD-10-CM

## 2013-10-31 HISTORY — PX: TEE WITHOUT CARDIOVERSION: SHX5443

## 2013-10-31 LAB — GLUCOSE, CAPILLARY
Glucose-Capillary: 131 mg/dL — ABNORMAL HIGH (ref 70–99)
Glucose-Capillary: 132 mg/dL — ABNORMAL HIGH (ref 70–99)
Glucose-Capillary: 145 mg/dL — ABNORMAL HIGH (ref 70–99)

## 2013-10-31 LAB — URINE CULTURE
CULTURE: NO GROWTH
Colony Count: NO GROWTH

## 2013-10-31 LAB — HEPARIN LEVEL (UNFRACTIONATED)
HEPARIN UNFRACTIONATED: 0.35 [IU]/mL (ref 0.30–0.70)
Heparin Unfractionated: 0.36 IU/mL (ref 0.30–0.70)

## 2013-10-31 LAB — BASIC METABOLIC PANEL
BUN: 13 mg/dL (ref 6–23)
CALCIUM: 8.3 mg/dL — AB (ref 8.4–10.5)
CO2: 28 mEq/L (ref 19–32)
CREATININE: 0.46 mg/dL — AB (ref 0.50–1.10)
Chloride: 100 mEq/L (ref 96–112)
GFR calc Af Amer: >90 mL/min (ref >90)
GFR calc non Af Amer: >90 mL/min (ref >90)
Glucose, Bld: 112 mg/dL — ABNORMAL HIGH (ref 70–99)
Potassium: 3.3 mEq/L — ABNORMAL LOW (ref 3.7–5.3)
Sodium: 140 mEq/L (ref 137–147)

## 2013-10-31 LAB — CBC
HEMATOCRIT: 41.6 % (ref 36.0–46.0)
HEMOGLOBIN: 13.4 g/dL (ref 12.0–15.0)
MCH: 28.1 pg (ref 26.0–34.0)
MCHC: 32.2 g/dL (ref 30.0–36.0)
MCV: 87.2 fL (ref 78.0–100.0)
Platelets: 271 10*3/uL (ref 150–400)
RBC: 4.77 MIL/uL (ref 3.87–5.11)
RDW: 15 % (ref 11.5–15.5)
WBC: 9.3 10*3/uL (ref 4.0–10.5)

## 2013-10-31 SURGERY — ECHOCARDIOGRAM, TRANSESOPHAGEAL
Anesthesia: Moderate Sedation

## 2013-10-31 MED ORDER — METOPROLOL TARTRATE 1 MG/ML IV SOLN
5.0000 mg | INTRAVENOUS | Status: AC
  Administered 2013-10-31: 5 mg via INTRAVENOUS

## 2013-10-31 MED ORDER — METHYLPREDNISOLONE SODIUM SUCC 40 MG IJ SOLR
40.0000 mg | Freq: Two times a day (BID) | INTRAMUSCULAR | Status: DC
  Administered 2013-10-31 – 2013-11-01 (×2): 40 mg via INTRAVENOUS
  Filled 2013-10-31 (×4): qty 1

## 2013-10-31 MED ORDER — BUTAMBEN-TETRACAINE-BENZOCAINE 2-2-14 % EX AERO
INHALATION_SPRAY | CUTANEOUS | Status: DC | PRN
  Administered 2013-10-31: 2 via TOPICAL

## 2013-10-31 MED ORDER — WARFARIN SODIUM 7.5 MG PO TABS
7.5000 mg | ORAL_TABLET | Freq: Once | ORAL | Status: AC
  Administered 2013-10-31: 7.5 mg via ORAL
  Filled 2013-10-31: qty 1

## 2013-10-31 MED ORDER — METOPROLOL TARTRATE 1 MG/ML IV SOLN
INTRAVENOUS | Status: AC
  Filled 2013-10-31: qty 5

## 2013-10-31 MED ORDER — COUMADIN BOOK
Freq: Once | Status: AC
  Administered 2013-10-31: 18:00:00
  Filled 2013-10-31: qty 1

## 2013-10-31 MED ORDER — FENTANYL CITRATE 0.05 MG/ML IJ SOLN
INTRAMUSCULAR | Status: AC
  Filled 2013-10-31: qty 2

## 2013-10-31 MED ORDER — WARFARIN VIDEO: Freq: Once | Status: DC

## 2013-10-31 MED ORDER — FENTANYL CITRATE 0.05 MG/ML IJ SOLN
INTRAMUSCULAR | Status: DC | PRN
  Administered 2013-10-31: 25 ug via INTRAVENOUS

## 2013-10-31 MED ORDER — MIDAZOLAM HCL 5 MG/ML IJ SOLN
INTRAMUSCULAR | Status: AC
  Filled 2013-10-31: qty 2

## 2013-10-31 MED ORDER — MIDAZOLAM HCL 10 MG/2ML IJ SOLN
INTRAMUSCULAR | Status: DC | PRN
  Administered 2013-10-31: 2 mg via INTRAVENOUS
  Administered 2013-10-31: 1 mg via INTRAVENOUS

## 2013-10-31 MED ORDER — WARFARIN - PHARMACIST DOSING INPATIENT: Freq: Every day | Status: DC

## 2013-10-31 NOTE — Interval H&P Note (Signed)
History and Physical Interval Note:  10/31/2013 11:13 AM  Heidi Stark  has presented today for surgery, with the diagnosis of endocarditis  The various methods of treatment have been discussed with the patient and family. After consideration of risks, benefits and other options for treatment, the patient has consented to  Procedure(s): TRANSESOPHAGEAL ECHOCARDIOGRAM (TEE) (N/A) as a surgical intervention .  The patient's history has been reviewed, patient examined, no change in status, stable for surgery.  I have reviewed the patient's chart and labs.  Questions were answered to the patient's satisfaction.     Samantha Olivera Claris Gladden

## 2013-10-31 NOTE — Progress Notes (Signed)
ANTICOAGULATION CONSULT NOTE - Follow Up Consult  Pharmacy Consult for Heparin and starting warfarin 4/17 Indication: acute splenic and right renal infarcts   Allergies  Allergen Reactions  . Sulfonamide Derivatives     Stomach cramps  . Neomycin-Bacitracin Zn-Polymyx Rash    Patient Measurements: Height: 5\' 6"  (167.6 cm) Weight: 246 lb 4.1 oz (111.7 kg) IBW/kg (Calculated) : 59.3 Heparin Dosing Weight: 85 kg  Vital Signs: Temp: 98.2 F (36.8 C) (04/17 1305) Temp src: Oral (04/17 1305) BP: 147/65 mmHg (04/17 1305) Pulse Rate: 65 (04/17 1305)  Labs:  Recent Labs  10/28/13 1809 10/28/13 2202 10/29/13 0750  10/30/13 0100  10/30/13 2115 10/31/13 0425 10/31/13 1356  HGB 14.7  --  13.5  --  12.5  --   --  13.4  --   HCT 45.0  --  40.9  --  39.0  --   --  41.6  --   PLT 220  --  222  --  233  --   --  271  --   APTT  --  33  --   --   --   --   --   --   --   LABPROT  --  13.1  --   --   --   --   --   --   --   INR  --  1.01  --   --   --   --   --   --   --   HEPARINUNFRC  --   --  <0.10*  < > 0.22*  < > 0.39 0.36 0.35  CREATININE 0.51  --  0.48*  --  0.61  --   --  0.46*  --   < > = values in this interval not displayed.  Estimated Creatinine Clearance: 81.8 ml/min (by C-G formula based on Cr of 0.46).   Medications:  Infusions:  . heparin 2,400 Units/hr (10/31/13 0849)    Assessment: 32 yoF presented to [facility] ED on 4/14 with abdominal pain and nausea.  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  No PMH of clotting disorder or anticoagulation, but daughter has lupus anticoagulant and factor V leiden with hx thrombus.  Pharmacy consulted to dose IV heparin.  Repeat heparin level therapeutic now x 3 on current rate of 2400 units/hr  Stable CBC  No reported bleeding  To start warfarin tonight - baseline INR 1.01  Goal of Therapy:  INR 2-3 Heparin level 0.3-0.7 units/ml Monitor platelets by  anticoagulation protocol: Yes   Plan:  1) Continue IV heparin 2400 units/hr 2) Warfarin 7.5mg  x 1 tonight 3) Daily INR and heparin level 4) Warfarin education materials and order video  Adrian Saran, PharmD, BCPS Pager [phone] 10/31/2013 3:11 PM

## 2013-10-31 NOTE — Progress Notes (Signed)
Echocardiogram Echocardiogram Transesophageal has been performed.  Alyson Locket Kately Graffam 10/31/2013, 11:49 AM

## 2013-10-31 NOTE — CV Procedure (Addendum)
Procedure: TEE  Indication: Arterial embolism  Sedation: Versed 3 mg IV, Fentanyl 25 mcg IV  Findings: Please see echo section for full report.  Normal LV size with mild LV hypertrophy.  EF 50-55% (low normal to mildly reduced).  Mild to moderate left atrial enlargement.  The LA appendage was large.  I do not think that there was a thrombus in the appendage but evaluation was somewhat difficult due to artifact.  Mild MR, no vegetation.  No aortic valve vegetation, stenosis, or regurgitation.  Mild TR with mild pulmonary hypertension.  Negative bubble study.  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   No endocarditis.  The only potential source of embolus noted would be aortic plaque. I think she ought to have a 30 day monitor at discharge to look for atrial fibrillation (as a source of cardioembolism).   Larey Dresser 10/31/2013 11:37 AM

## 2013-10-31 NOTE — Progress Notes (Signed)
Patient ID: Heidi Stark, female   DOB: [birth year], 73 y.o.   MRN: [id number]  [facility] PROGRESS NOTE  GWENIVERE HIRALDO WUJ:[id number] DOB: 1941/06/23 DOA: 10/28/2013 PCP: Garnet Koyanagi, DO  Brief narrative:  73 y.o. female presented to ED with L sided abdominal pain that started 2-3 days prior to this admission, sharp and constant, 7/10 in severity, worse with movement and less intense with rest, non radiating, associated with nausea, poor oral intake, subjective fevers, chills.   In ED, imagining studies notable for acute splenic and right renal infarcts suspicious for arterial embolic source.   Principal Problem:  Splenic infarct, right renal infarct  - unclear etiology at this time  - given pt's fevers on admission and with leukocytosis, endocarditis is certainly in differential  - ? Atherosclerotic disease  - 2 D ECHO with no clear embolic source identified and with normal systolic function  - TEE done and with no embolic source, will continue to follow up on cardiology recommendations  - continue heparin for now with transition to coumadin  Active Problems:  Acute respiratory failure  - pt has less shortness of breath and wheezing with expiration, she has history of COPD  - continue BD's scheduled and as needed and add solumedrol  - CXR with mild CHF 4/16 and given onedose of Lasix 20 mg IV (4/16) in addition to her scheduled lasix 20 mg PO BID  - weigh today is 246 lbs and stable over the past 24 hours, will monitor daily weights, strict I's and O's  Leukocytosis  - unclear etiology, UA suggestive of UTI but again endocarditis is in differential, CXR with bronchitic changes but no overt developing PNA  - pt is currently on Vancomycin and Rocephin day #4 - will follow up on urine and blood culture  - WBC is trending down and is WNL this AM, repeat CBC in AM  DIABETES MELLITUS, TYPE II  - A1C 6.5, indicative of good diabetic control  - continue Amaryl, also placed on SSI  while inpatient  HYPERTENSION  - SBP in 170's, currently on lasix 20 mg PO BID, metoprolol 6.25 mg BID, lisinopril 5 mg QD  - will need to readjust the regimen if BP remains uncontrolled   Consultants:  Cardiology  Procedures/Studies:  Dg Chest 2 View 10/28/2013 Stable cardiomegaly, chronic bronchitic changes and interstitial prominence without evidence of acute cardiopulmonary disease.  Ct Abdomen Pelvis W Contrast 10/28/2013 1. 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 Valvular calcifications in the mitral and aortic valve may support a valvular source - echocardiography is likely warranted. 2. 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 3. Hepatomegaly. 4. Cardiomegaly. 5. 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 6. Lumbar spondylosis and degenerative disc disease, with severe right foraminal stenosis at L5-S1. 7. 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 No current findings of strangulation. 8. Nonobstructive left nephrolithiasis. Antibiotics:  Vancomycin 4/14 -->  Rocephin 4/14 -->   Code Status: Full  Family Communication: Pt at bedside  Disposition Plan: Home when medically stable   HPI/Subjective: No events overnight.   Objective: Filed Vitals:   10/31/13 1200 10/31/13 1210 10/31/13 1220 10/31/13 1305  BP: 164/76 154/71 149/69 147/65  Pulse: 58 58  65  Temp:    98.2 F (36.8 C)  TempSrc:    Oral  Resp: 19 20  21 18  Height:      Weight:      SpO2: 96% 95%  94%    Intake/Output Summary (Last 24 hours) at 10/31/13 1445 Last data filed at 10/31/13 1338  Gross per 24 hour  Intake 1250.94 ml  Output   3175 ml  Net -1924.06 ml    Exam:   General:  Pt is alert, follows commands appropriately, not in acute distress  Cardiovascular: Regular rate and rhythm,  S1/S2, no murmurs, no rubs, no gallops  Respiratory: Mild end expiratory wheezing and rhonchi at bases   Abdomen: Soft, non tender, non distended, bowel sounds present, no guarding  Extremities: No edema, pulses DP and PT palpable bilaterally  Data Reviewed: Basic Metabolic Panel:  Recent Labs Lab 10/28/13 1809 10/29/13 0750 10/30/13 0100 10/31/13 0425  NA 134* 135* 139 140  K 3.7 3.7 3.4* 3.3*  CL 94* 97 99 100  CO2 24 25 29 28   GLUCOSE 132* 189* 121* 112*  BUN 20 16 15 13   CREATININE 0.51 0.48* 0.61 0.46*  CALCIUM 9.1 8.3* 8.4 8.3*   Liver Function Tests:  Recent Labs Lab 10/28/13 1809  AST 33  ALT 22  ALKPHOS 59  BILITOT 0.5  PROT 7.8  ALBUMIN 3.4*    Recent Labs Lab 10/28/13 1809  LIPASE 37   CBC:  Recent Labs Lab 10/28/13 1809 10/29/13 0750 10/30/13 0100 10/31/13 0425  WBC 15.4* 12.2* 10.9* 9.3  NEUTROABS 11.5*  --   --   --   HGB 14.7 13.5 12.5 13.4  HCT 45.0 40.9 39.0 41.6  MCV 87.5 86.7 89.4 87.2  PLT 220 222 233 271   CBG:  Recent Labs Lab 10/30/13 0654 10/30/13 1127 10/30/13 1707 10/31/13 0525 10/31/13 0733  GLUCAP 123* 129* 124* 131* 132*    Recent Results (from the past 240 hour(s))  CULTURE, BLOOD (ROUTINE X 2)     Status: None   Collection Time    10/28/13  9:35 PM      Result Value Ref Range Status   Specimen Description BLOOD LEFT ANTECUBITAL   Final   Special Requests BOTTLES DRAWN AEROBIC AND ANAEROBIC 5CC   Final   Culture  Setup Time     Final   Value: 10/29/2013 00:39     Performed at [facility]   Culture     Final   Value:        BLOOD CULTURE RECEIVED NO GROWTH TO DATE CULTURE WILL BE HELD FOR 5 DAYS BEFORE ISSUING A FINAL NEGATIVE REPORT     Performed at [facility]   Report Status PENDING   Incomplete  CULTURE, BLOOD (ROUTINE X 2)     Status: None   Collection Time    10/28/13  9:40 PM      Result Value Ref Range Status   Specimen Description BLOOD BLOOD LEFT FOREARM   Final   Special  Requests BOTTLES DRAWN AEROBIC AND ANAEROBIC 3CC   Final   Culture  Setup Time     Final   Value: 10/29/2013 00:39     Performed at [facility]   Culture     Final   Value:        BLOOD CULTURE RECEIVED NO GROWTH TO DATE CULTURE WILL BE HELD FOR 5 DAYS BEFORE ISSUING A FINAL NEGATIVE REPORT     Performed at [facility]   Report Status PENDING   Incomplete  URINE CULTURE     Status:  None   Collection Time    10/29/13  7:11 PM      Result Value Ref Range Status   Specimen Description URINE, CLEAN CATCH   Final   Special Requests NONE   Final   Culture  Setup Time     Final   Value: 10/30/2013 05:10     Performed at [facility]     Final   Value: NO GROWTH     Performed at [facility]   Culture     Final   Value: NO GROWTH     Performed at [facility]   Report Status 10/31/2013 FINAL   Final     Scheduled Meds: . albuterol  2.5 mg Nebulization QID  . Alogliptin-Metformin HCl  1 tablet Oral BID  . atorvastatin  40 mg Oral q1800  . Canagliflozin  100 mg Oral Daily  . cefTRIAXone (ROCEPHIN)  IV  2 g Intravenous Q24H  . fenofibrate  160 mg Oral Daily  . furosemide  20 mg Oral BID  . glimepiride  2 mg Oral Q supper  . insulin aspart  0-9 Units Subcutaneous TID WC  . lisinopril  5 mg Oral Daily  . metoprolol tartrate  6.25 mg Oral BID  . mometasone-formoterol  2 puff Inhalation BID  . potassium chloride  10 mEq Oral BID  . sodium chloride  3 mL Intravenous Q12H  . vancomycin  1,500 mg Intravenous Q12H   Continuous Infusions: . heparin 2,400 Units/hr (10/31/13 0849)     [name], MD  [facility] Pager [phone]  If 7PM-7AM, please contact night-coverage [email or web address] Password [email or web address] 10/31/2013, 2:45 PM   LOS: 3 days

## 2013-10-31 NOTE — Progress Notes (Signed)
ANTICOAGULATION CONSULT NOTE - Follow Up Consult  Pharmacy Consult for Heparin Indication: acute splenic and right renal infarcts    Allergies  Allergen Reactions  . Sulfonamide Derivatives     Stomach cramps  . Neomycin-Bacitracin Zn-Polymyx Rash    Patient Measurements: Height: 5\' 6"  (167.6 cm) Weight: 246 lb 4.1 oz (111.7 kg) IBW/kg (Calculated) : 59.3 Heparin Dosing Weight:   Vital Signs: Temp: 98.2 F (36.8 C) (04/17 0522) Temp src: Oral (04/17 0522) BP: 145/52 mmHg (04/17 0522) Pulse Rate: 68 (04/17 0522)  Labs:  Recent Labs  10/28/13 1809 10/28/13 2202 10/29/13 0750  10/30/13 0100 10/30/13 0953 10/30/13 2115 10/31/13 0425  HGB 14.7  --  13.5  --  12.5  --   --  13.4  HCT 45.0  --  40.9  --  39.0  --   --  41.6  PLT 220  --  222  --  233  --   --  271  APTT  --  33  --   --   --   --   --   --   LABPROT  --  13.1  --   --   --   --   --   --   INR  --  1.01  --   --   --   --   --   --   HEPARINUNFRC  --   --  <0.10*  < > 0.22* 0.19* 0.39 0.36  CREATININE 0.51  --  0.48*  --  0.61  --   --  0.46*  < > = values in this interval not displayed.  Estimated Creatinine Clearance: 81.8 ml/min (by C-G formula based on Cr of 0.46).   Medications:  Infusions:  . heparin 2,400 Units/hr (10/30/13 2214)    Assessment: Patient with heparin level at goal.  No issues per RN.  Goal of Therapy:  Heparin level 0.3-0.7 units/ml Monitor platelets by anticoagulation protocol: Yes   Plan:  Heparin level at goal.  Continue with current drip.  Shea Stakes [place]. 10/31/2013,6:05 AM

## 2013-10-31 NOTE — H&P (View-Only) (Signed)
Patient Name: Heidi Stark Date of Encounter: 10/31/2013     Principal Problem:   Splenic infarct Active Problems:   DIABETES MELLITUS, TYPE II   HYPERTENSION   Renal infarct    SUBJECTIVE  No chest pain. She has mild dyspnea. Just feels blah.   CURRENT MEDS . albuterol  2.5 mg Nebulization QID  . Alogliptin-Metformin HCl  1 tablet Oral BID  . atorvastatin  40 mg Oral q1800  . Canagliflozin  100 mg Oral Daily  . cefTRIAXone (ROCEPHIN)  IV  2 g Intravenous Q24H  . fenofibrate  160 mg Oral Daily  . furosemide  20 mg Oral BID  . glimepiride  2 mg Oral Q supper  . insulin aspart  0-9 Units Subcutaneous TID WC  . lisinopril  5 mg Oral Daily  . metoprolol tartrate  6.25 mg Oral BID  . mometasone-formoterol  2 puff Inhalation BID  . potassium chloride  10 mEq Oral BID  . sodium chloride  3 mL Intravenous Q12H  . vancomycin  1,500 mg Intravenous Q12H    OBJECTIVE  Filed Vitals:   10/30/13 2006 10/30/13 2131 10/31/13 0522 10/31/13 0728  BP:  137/70 145/52   Pulse:  62 68   Temp:  98.3 F (36.8 C) 98.2 F (36.8 C)   TempSrc:  Oral Oral   Resp:  20 18   Height:      Weight:      SpO2: 94% 95% 96% 99%    Intake/Output Summary (Last 24 hours) at 10/31/13 0847 Last data filed at 10/31/13 0630  Gross per 24 hour  Intake 1690.94 ml  Output   3075 ml  Net -1384.06 ml   Filed Weights   10/28/13 2152 10/28/13 2252  Weight: 260 lb (117.935 kg) 246 lb 4.1 oz (111.7 kg)    PHYSICAL EXAM  General: Pleasant, NAD. Neuro: Alert and oriented X 3. Moves all extremities spontaneously. Psych: Normal affect. HEENT:  Normal  Neck: Supple without bruits or JVD. Lungs:  Mild expiratory wheezes and rhonchi. Heart: RRR no s3, s4, or murmurs. Abdomen: Soft, non-tender, non-distended, BS + x 4.  Extremities: No clubbing, cyanosis or edema. DP/PT/Radials 2+ and equal bilaterally.  Accessory Clinical Findings  CBC  Recent Labs  10/28/13 1809  10/30/13 0100  10/31/13 0425  WBC 15.4*  < > 10.9* 9.3  NEUTROABS 11.5*  --   --   --   HGB 14.7  < > 12.5 13.4  HCT 45.0  < > 39.0 41.6  MCV 87.5  < > 89.4 87.2  PLT 220  < > 233 271  < > = values in this interval not displayed. Basic Metabolic Panel  Recent Labs  10/30/13 0100 10/31/13 0425  NA 139 140  K 3.4* 3.3*  CL 99 100  CO2 29 28  GLUCOSE 121* 112*  BUN 15 13  CREATININE 0.61 0.46*  CALCIUM 8.4 8.3*   Liver Function Tests  Recent Labs  10/28/13 1809  AST 33  ALT 22  ALKPHOS 59  BILITOT 0.5  PROT 7.8  ALBUMIN 3.4*    Recent Labs  10/28/13 1809  LIPASE 37   Cardiac Enzymes No results found for this basename: CKTOTAL, CKMB, CKMBINDEX, TROPONINI,  in the last 72 hours BNP No components found with this basename: POCBNP,  D-Dimer No results found for this basename: DDIMER,  in the last 72 hours Hemoglobin A1C  Recent Labs  10/30/13 0100  HGBA1C 6.5*   Fasting Lipid Panel  No results found for this basename: CHOL, HDL, LDLCALC, TRIG, CHOLHDL, LDLDIRECT,  in the last 72 hours Thyroid Function Tests No results found for this basename: TSH, T4TOTAL, FREET3, T3FREE, THYROIDAB,  in the last 72 hours  TELE  NSR with frequent PVCs, couplets  ECG NSR, LVH   Radiology/Studies  Dg Chest 2 View  10/28/2013   CLINICAL DATA:  Short of breath, fever  EXAM: CHEST  2 VIEW  COMPARISON:  Prior chest x-ray 09/03/2009  FINDINGS: Stable mild cardiomegaly. Atherosclerotic calcifications noted in the transverse and descending thoracic aorta. Similar bronchitic changes and interstitial prominence particularly in the bilateral lung bases compared to prior. No focal airspace consolidation, pleural effusion or pneumothorax. No acute osseous abnormality.  IMPRESSION: Stable cardiomegaly, chronic bronchitic changes and interstitial prominence without evidence of acute cardiopulmonary disease.   Electronically Signed   By: Heath  McCullough M.D.   On: 10/28/2013 19:05   Ct Abdomen Pelvis  W Contrast  10/28/2013   CLINICAL DATA:  Mid abdominal pain. Fatigue. Left lower abdominal pain.  EXAM: CT ABDOMEN AND PELVIS WITH CONTRAST  TECHNIQUE: Multidetector CT imaging of the abdomen and pelvis was performed using the standard protocol following bolus administration of intravenous contrast.  CONTRAST:  100mL OMNIPAQUE IOHEXOL 300 MG/ML  SOLN  COMPARISON:  US RENAL dated 08/12/2012; CT CHEST W/CM dated 08/14/2006  FINDINGS: Peripheral interstitial accentuation in the lung bases noted, increased from 08/14/2006. Mildly prominent pulmonary vasculature in the lower lobes.  Cardiomegaly observed with calcification of the mitral valve and aortic valve.  There is a new band of abnormal hypodensity in the spleen which persists on delayed images, compatible with splenic infarct. I estimate that this involves about 40% of the splenic volume.  Mild lobularity of the anterior margin of the left hepatic lobe.  1.6 x 4.0 cm left adrenal mass, portal venous phase image density 36 Hounsfield units, delayed density 26 Hounsfield units, relative washout 28% (indeterminate) however, similar appearance noted on prior chest CT.  Wedge shaped the hypodensities in the right kidney lower pole persist on delayed images and are suspicious for infarcts.  3 mm left kidney lower pole nonobstructive calculus. No hydronephrosis or hydroureter. Urinary bladder unremarkable.  A small right paracentral Richter hernia is observed on image 31 of series 2, containing a small margin of the transverse colon, without current findings of strangulation.  Aortoiliac atherosclerotic vascular disease. Left external iliac artery appears severely thinned and attenuated, Re constituting at the level of the common femoral artery No pathologic upper abdominal adenopathy is observed. No pathologic pelvic adenopathy is observed. No dilated bowel observed. Uterine and adnexal contours unremarkable.  Considerable lumbar spondylosis and degenerative disc disease  noted. Hepatomegaly is present.  Mildly mobile cecum noted with the cecum in the mid abdomen. Appendix normal.  IMPRESSION: 1. 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 Valvular calcifications in the mitral and aortic valve may support a valvular source - echocardiography is likely warranted. 2. 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 3. Hepatomegaly. 4. Cardiomegaly. 5. 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 6. Lumbar spondylosis and degenerative disc disease, with severe right foraminal stenosis at L5-S1. 7. 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 No current findings of strangulation. 8. Nonobstructive left nephrolithiasis.  Critical Value/emergent results were called by telephone at the time of interpretation on 10/28/2013 at   8:59 PM to Dr. SCOTT GOLDSTON , who verbally acknowledged these results.   Electronically Signed   By: Walt  Liebkemann M.D.   On: 10/28/2013 21:02   Echo: transthoracic: Study Conclusions  - Left ventricle: Wall thickness was increased in a pattern of moderate LVH. Systolic function was normal. The estimated ejection fraction was in the range of 50% to 55%. Regional wall motion abnormalities cannot be excluded. - Mitral valve: Calcified annulus. - Left atrium: The atrium was mildly dilated. - Right ventricle: The cavity size was mildly dilated. - Right atrium: The atrium was mildly dilated. Impressions:  - No cardiac source of embolism was identified, but cannot be ruled out on the basis of this examination. Recommendations: Consider transesophageal echocardiography   ASSESSMENT AND PLAN 1. 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 Source. 2D Echo insufficient to evaluate.  Source could be endocarditis but no real risk factors and no  prior history of valve problems. Source may also be from atherosclerotic plaque in the aorta, for which she has multiple risk factors. On Rocephin. Will proceed with TEE today. 2. Diabetes.  3. COPD  4. Hypertension  5. Dyslipidemia.    Signed, Peter M Jordan MD,FACC 10/31/2013 8:50 AM    

## 2013-10-31 NOTE — Progress Notes (Signed)
Picked up by [facility].

## 2013-10-31 NOTE — Progress Notes (Signed)
Patient Name: Heidi Stark Date of Encounter: 10/31/2013     Principal Problem:   Splenic infarct Active Problems:   DIABETES MELLITUS, TYPE II   HYPERTENSION   Renal infarct    SUBJECTIVE  No chest pain. She has mild dyspnea. Just feels blah.   CURRENT MEDS . albuterol  2.5 mg Nebulization QID  . Alogliptin-Metformin HCl  1 tablet Oral BID  . atorvastatin  40 mg Oral q1800  . Canagliflozin  100 mg Oral Daily  . cefTRIAXone (ROCEPHIN)  IV  2 g Intravenous Q24H  . fenofibrate  160 mg Oral Daily  . furosemide  20 mg Oral BID  . glimepiride  2 mg Oral Q supper  . insulin aspart  0-9 Units Subcutaneous TID WC  . lisinopril  5 mg Oral Daily  . metoprolol tartrate  6.25 mg Oral BID  . mometasone-formoterol  2 puff Inhalation BID  . potassium chloride  10 mEq Oral BID  . sodium chloride  3 mL Intravenous Q12H  . vancomycin  1,500 mg Intravenous Q12H    OBJECTIVE  Filed Vitals:   10/30/13 2006 10/30/13 2131 10/31/13 0522 10/31/13 0728  BP:  137/70 145/52   Pulse:  62 68   Temp:  98.3 F (36.8 C) 98.2 F (36.8 C)   TempSrc:  Oral Oral   Resp:  20 18   Height:      Weight:      SpO2: 94% 95% 96% 99%    Intake/Output Summary (Last 24 hours) at 10/31/13 0847 Last data filed at 10/31/13 0630  Gross per 24 hour  Intake 1690.94 ml  Output   3075 ml  Net -1384.06 ml   Filed Weights   10/28/13 2152 10/28/13 2252  Weight: 260 lb (117.935 kg) 246 lb 4.1 oz (111.7 kg)    PHYSICAL EXAM  General: Pleasant, NAD. Neuro: Alert and oriented X 3. Moves all extremities spontaneously. Psych: Normal affect. HEENT:  Normal  Neck: Supple without bruits or JVD. Lungs:  Mild expiratory wheezes and rhonchi. Heart: RRR no s3, s4, or murmurs. Abdomen: Soft, non-tender, non-distended, BS + x 4.  Extremities: No clubbing, cyanosis or edema. DP/PT/Radials 2+ and equal bilaterally.  Accessory Clinical Findings  CBC  Recent Labs  10/28/13 1809  10/30/13 0100  10/31/13 0425  WBC 15.4*  < > 10.9* 9.3  NEUTROABS 11.5*  --   --   --   HGB 14.7  < > 12.5 13.4  HCT 45.0  < > 39.0 41.6  MCV 87.5  < > 89.4 87.2  PLT 220  < > 233 271  < > = values in this interval not displayed. Basic Metabolic Panel  Recent Labs  10/30/13 0100 10/31/13 0425  NA 139 140  K 3.4* 3.3*  CL 99 100  CO2 29 28  GLUCOSE 121* 112*  BUN 15 13  CREATININE 0.61 0.46*  CALCIUM 8.4 8.3*   Liver Function Tests  Recent Labs  10/28/13 1809  AST 33  ALT 22  ALKPHOS 59  BILITOT 0.5  PROT 7.8  ALBUMIN 3.4*    Recent Labs  10/28/13 1809  LIPASE 37   Cardiac Enzymes No results found for this basename: CKTOTAL, CKMB, CKMBINDEX, TROPONINI,  in the last 72 hours BNP No components found with this basename: POCBNP,  D-Dimer No results found for this basename: DDIMER,  in the last 72 hours Hemoglobin A1C  Recent Labs  10/30/13 0100  HGBA1C 6.5*   Fasting Lipid Panel  No results found for this basename: CHOL, HDL, LDLCALC, TRIG, CHOLHDL, LDLDIRECT,  in the last 72 hours Thyroid Function Tests No results found for this basename: TSH, T4TOTAL, FREET3, T3FREE, THYROIDAB,  in the last 72 hours  TELE  NSR with frequent PVCs, couplets  ECG NSR, LVH   Radiology/Studies  Dg Chest 2 View  10/28/2013   CLINICAL DATA:  Short of breath, fever  EXAM: CHEST  2 VIEW  COMPARISON:  Prior chest x-ray 09/03/2009  FINDINGS: Stable mild cardiomegaly. Atherosclerotic calcifications noted in the transverse and descending thoracic aorta. Similar bronchitic changes and interstitial prominence particularly in the bilateral lung bases compared to prior. No focal airspace consolidation, pleural effusion or pneumothorax. No acute osseous abnormality.  IMPRESSION: Stable cardiomegaly, chronic bronchitic changes and interstitial prominence without evidence of acute cardiopulmonary disease.   Electronically Signed   By: Jacqulynn Cadet M.D.   On: 10/28/2013 19:05   Ct Abdomen Pelvis  W Contrast  10/28/2013   CLINICAL DATA:  Mid abdominal pain. Fatigue. Left lower abdominal pain.  EXAM: CT ABDOMEN AND PELVIS WITH CONTRAST  TECHNIQUE: Multidetector CT imaging of the abdomen and pelvis was performed using the standard protocol following bolus administration of intravenous contrast.  CONTRAST:  117mL OMNIPAQUE IOHEXOL 300 MG/ML  SOLN  COMPARISON:  US RENAL dated 08/12/2012; CT CHEST W/CM dated 08/14/2006  FINDINGS: Peripheral interstitial accentuation in the lung bases noted, increased from 08/14/2006. Mildly prominent pulmonary vasculature in the lower lobes.  Cardiomegaly observed with calcification of the mitral valve and aortic valve.  There is a new band of abnormal hypodensity in the spleen which persists on delayed images, compatible with splenic infarct. I estimate that this involves about 40% of the splenic volume.  Mild lobularity of the anterior margin of the left hepatic lobe.  1.6 x 4.0 cm left adrenal mass, portal venous phase image density 36 Hounsfield units, delayed density 26 Hounsfield units, relative washout 28% (indeterminate) however, similar appearance noted on prior chest CT.  Wedge shaped the hypodensities in the right kidney lower pole persist on delayed images and are suspicious for infarcts.  3 mm left kidney lower pole nonobstructive calculus. No hydronephrosis or hydroureter. Urinary bladder unremarkable.  A small right paracentral Richter hernia is observed on image 31 of series 2, containing a small margin of the transverse colon, without current findings of strangulation.  Aortoiliac atherosclerotic vascular disease. Left external iliac artery appears severely thinned and attenuated, Re constituting at the level of the common femoral artery No pathologic upper abdominal adenopathy is observed. No pathologic pelvic adenopathy is observed. No dilated bowel observed. Uterine and adnexal contours unremarkable.  Considerable lumbar spondylosis and degenerative disc disease  noted. Hepatomegaly is present.  Mildly mobile cecum noted with the cecum in the mid abdomen. Appendix normal.  IMPRESSION: 1. 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 Valvular calcifications in the mitral and aortic valve may support a valvular source - echocardiography is likely warranted. 2. 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 3. Hepatomegaly. 4. Cardiomegaly. 5. 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 6. Lumbar spondylosis and degenerative disc disease, with severe right foraminal stenosis at L5-S1. 7. 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 No current findings of strangulation. 8. Nonobstructive left nephrolithiasis.  Critical Value/emergent results were called by telephone at the time of interpretation on 10/28/2013 at  8:59 PM to Dr. Sherwood Gambler , who verbally acknowledged these results.   Electronically Signed   By: Sherryl Barters M.D.   On: 10/28/2013 21:02   Echo: transthoracic: Study Conclusions  - Left ventricle: Wall thickness was increased in a pattern of moderate LVH. Systolic function was normal. The estimated ejection fraction was in the range of 50% to 55%. Regional wall motion abnormalities cannot be excluded. - Mitral valve: Calcified annulus. - Left atrium: The atrium was mildly dilated. - Right ventricle: The cavity size was mildly dilated. - Right atrium: The atrium was mildly dilated. Impressions:  - No cardiac source of embolism was identified, but cannot be ruled out on the basis of this examination. Recommendations: Consider transesophageal echocardiography   ASSESSMENT AND PLAN 1. 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 Source. 2D Echo insufficient to evaluate.  Source could be endocarditis but no real risk factors and no  prior history of valve problems. Source may also be from atherosclerotic plaque in the aorta, for which she has multiple risk factors. On Rocephin. Will proceed with TEE today. 2. Diabetes.  3. COPD  4. Hypertension  5. Dyslipidemia.    Signed, Slayden Mennenga M [place] MD,FACC 10/31/2013 8:50 AM

## 2013-11-01 LAB — CBC
HEMATOCRIT: 43.3 % (ref 36.0–46.0)
HEMOGLOBIN: 14 g/dL (ref 12.0–15.0)
MCH: 28.2 pg (ref 26.0–34.0)
MCHC: 32.3 g/dL (ref 30.0–36.0)
MCV: 87.1 fL (ref 78.0–100.0)
PLATELETS: 278 10*3/uL (ref 150–400)
RBC: 4.97 MIL/uL (ref 3.87–5.11)
RDW: 14.9 % (ref 11.5–15.5)
WBC: 8.7 10*3/uL (ref 4.0–10.5)

## 2013-11-01 LAB — BASIC METABOLIC PANEL
BUN: 14 mg/dL (ref 6–23)
CO2: 27 meq/L (ref 19–32)
CREATININE: 0.46 mg/dL — AB (ref 0.50–1.10)
Calcium: 9.1 mg/dL (ref 8.4–10.5)
Chloride: 97 mEq/L (ref 96–112)
GFR calc Af Amer: >90 mL/min (ref >90)
GFR calc non Af Amer: >90 mL/min (ref >90)
GLUCOSE: 139 mg/dL — AB (ref 70–99)
POTASSIUM: 4 meq/L (ref 3.7–5.3)
Sodium: 140 mEq/L (ref 137–147)

## 2013-11-01 LAB — GLUCOSE, CAPILLARY
GLUCOSE-CAPILLARY: 151 mg/dL — AB (ref 70–99)
GLUCOSE-CAPILLARY: 100 mg/dL — AB (ref 70–99)
Glucose-Capillary: 108 mg/dL — ABNORMAL HIGH (ref 70–99)

## 2013-11-01 LAB — HEPARIN LEVEL (UNFRACTIONATED): Heparin Unfractionated: 0.5 IU/mL (ref 0.30–0.70)

## 2013-11-01 LAB — PROTIME-INR
INR: 1.03 (ref 0.00–1.49)
Prothrombin Time: 13.3 seconds (ref 11.6–15.2)

## 2013-11-01 MED ORDER — ALBUTEROL SULFATE (2.5 MG/3ML) 0.083% IN NEBU
2.5000 mg | INHALATION_SOLUTION | Freq: Three times a day (TID) | RESPIRATORY_TRACT | Status: DC
  Administered 2013-11-01 – 2013-11-03 (×6): 2.5 mg via RESPIRATORY_TRACT
  Filled 2013-11-01 (×6): qty 3

## 2013-11-01 MED ORDER — WARFARIN SODIUM 7.5 MG PO TABS
7.5000 mg | ORAL_TABLET | Freq: Once | ORAL | Status: AC
  Administered 2013-11-01: 7.5 mg via ORAL
  Filled 2013-11-01: qty 1

## 2013-11-01 MED ORDER — METHYLPREDNISOLONE SODIUM SUCC 40 MG IJ SOLR
40.0000 mg | Freq: Every day | INTRAMUSCULAR | Status: DC
  Administered 2013-11-02 – 2013-11-03 (×2): 40 mg via INTRAVENOUS
  Filled 2013-11-01 (×2): qty 1

## 2013-11-01 NOTE — Progress Notes (Signed)
Patient Name: Heidi Stark Date of Encounter: 11/01/2013     Principal Problem:   Splenic infarct Active Problems:   DIABETES MELLITUS, TYPE II   HYPERTENSION   Renal infarct    SUBJECTIVE  Feels better today.   Splenic pain is better.No fever.  CURRENT MEDS . albuterol  2.5 mg Nebulization QID  . Alogliptin-Metformin HCl  1 tablet Oral BID  . atorvastatin  40 mg Oral q1800  . Canagliflozin  100 mg Oral Daily  . cefTRIAXone (ROCEPHIN)  IV  2 g Intravenous Q24H  . fenofibrate  160 mg Oral Daily  . furosemide  20 mg Oral BID  . glimepiride  2 mg Oral Q supper  . insulin aspart  0-9 Units Subcutaneous TID WC  . lisinopril  5 mg Oral Daily  . methylPREDNISolone (SOLU-MEDROL) injection  40 mg Intravenous Q12H  . metoprolol tartrate  6.25 mg Oral BID  . mometasone-formoterol  2 puff Inhalation BID  . potassium chloride  10 mEq Oral BID  . sodium chloride  3 mL Intravenous Q12H  . vancomycin  1,500 mg Intravenous Q12H  . warfarin  7.5 mg Oral ONCE-1800  . warfarin   Does not apply Once  . Warfarin - Pharmacist Dosing Inpatient   Does not apply q1800    OBJECTIVE  Filed Vitals:   10/31/13 2128 11/01/13 0206 11/01/13 0539 11/01/13 0751  BP: 180/74 133/42 150/54   Pulse: 88 64 61   Temp: 98.2 F (36.8 C)  97.5 F (36.4 C)   TempSrc: Oral  Oral   Resp: 18  18   Height:      Weight:      SpO2: 93%  95% 93%    Intake/Output Summary (Last 24 hours) at 11/01/13 0914 Last data filed at 11/01/13 0600  Gross per 24 hour  Intake   1648 ml  Output    950 ml  Net    698 ml   Filed Weights   10/28/13 2152 10/28/13 2252  Weight: 260 lb (117.935 kg) 246 lb 4.1 oz (111.7 kg)    PHYSICAL EXAM  General: Pleasant, NAD. Neuro: Alert and oriented X 3. Moves all extremities spontaneously. Psych: Normal affect. HEENT:  Normal  Neck: Supple without bruits or JVD. Lungs:  Resp regular and unlabored, CTA. Heart: RRR no s3, s4, or murmurs. Abdomen: Soft, non-tender,  non-distended, BS + x 4.  Extremities: No clubbing, cyanosis or edema. DP/PT/Radials 2+ and equal bilaterally.  Accessory Clinical Findings  CBC  Recent Labs  10/31/13 0425 11/01/13 0357  WBC 9.3 8.7  HGB 13.4 14.0  HCT 41.6 43.3  MCV 87.2 87.1  PLT 271 242   Basic Metabolic Panel  Recent Labs  10/31/13 0425 11/01/13 0357  NA 140 140  K 3.3* 4.0  CL 100 97  CO2 28 27  GLUCOSE 112* 139*  BUN 13 14  CREATININE 0.46* 0.46*  CALCIUM 8.3* 9.1   Liver Function Tests No results found for this basename: AST, ALT, ALKPHOS, BILITOT, PROT, ALBUMIN,  in the last 72 hours No results found for this basename: LIPASE, AMYLASE,  in the last 72 hours Cardiac Enzymes No results found for this basename: CKTOTAL, CKMB, CKMBINDEX, TROPONINI,  in the last 72 hours BNP No components found with this basename: POCBNP,  D-Dimer No results found for this basename: DDIMER,  in the last 72 hours Hemoglobin A1C  Recent Labs  10/30/13 0100  HGBA1C 6.5*   Fasting Lipid Panel No results found  for this basename: CHOL, HDL, LDLCALC, TRIG, CHOLHDL, LDLDIRECT,  in the last 72 hours Thyroid Function Tests No results found for this basename: TSH, T4TOTAL, FREET3, T3FREE, THYROIDAB,  in the last 72 hours  TELE  NSR  ECG  TEE on 10/31/13:  Radiology/Studies Study Conclusions  - Left ventricle: The cavity size was normal. Wall thickness was increased in a pattern of mild LVH. Systolic function was low normal to mildlyreduced.The estimated ejection fraction was in the range of 50% to 55%. Wall motion was normal; there were no regional wall motion abnormalities. - Aortic valve: No evidence of vegetation. There was no stenosis. - Aorta: Normal caliber, grade III-IV plaque in the descending thoracic aorta. - Mitral valve: No evidence of vegetation. Trivial regurgitation. - Left atrium: The atrium was mildly dilated. The LA appendage was large. I do not think there was a thrombus present.  There did appear to be artifact causing shadowing in some views. - Right ventricle: The cavity size was normal. Systolic function was normal. - Right atrium: The atrium was mildly dilated. - Atrial septum: No defect or patent foramen ovale was identified. Echo contrast study showed no right-to-left atrial level shunt, at baseline or with provocation. - Tricuspid valve: No evidence of vegetation. Peak RV-RA gradient 42 mmHg. - Pulmonic valve: No evidence of vegetation. Impressions:  - Potential source of embolism would be aortic plaque. Transesophageal echocardiography. 2D and color Doppler.   Dg Chest 2 View  10/28/2013   CLINICAL DATA:  Short of breath, fever  EXAM: CHEST  2 VIEW  COMPARISON:  Prior chest x-ray 09/03/2009  FINDINGS: Stable mild cardiomegaly. Atherosclerotic calcifications noted in the transverse and descending thoracic aorta. Similar bronchitic changes and interstitial prominence particularly in the bilateral lung bases compared to prior. No focal airspace consolidation, pleural effusion or pneumothorax. No acute osseous abnormality.  IMPRESSION: Stable cardiomegaly, chronic bronchitic changes and interstitial prominence without evidence of acute cardiopulmonary disease.   Electronically Signed   By: Jacqulynn Cadet M.D.   On: 10/28/2013 19:05   Ct Abdomen Pelvis W Contrast  10/28/2013   CLINICAL DATA:  Mid abdominal pain. Fatigue. Left lower abdominal pain.  EXAM: CT ABDOMEN AND PELVIS WITH CONTRAST  TECHNIQUE: Multidetector CT imaging of the abdomen and pelvis was performed using the standard protocol following bolus administration of intravenous contrast.  CONTRAST:  183mL OMNIPAQUE IOHEXOL 300 MG/ML  SOLN  COMPARISON:  US RENAL dated 08/12/2012; CT CHEST W/CM dated 08/14/2006  FINDINGS: Peripheral interstitial accentuation in the lung bases noted, increased from 08/14/2006. Mildly prominent pulmonary vasculature in the lower lobes.  Cardiomegaly observed with calcification  of the mitral valve and aortic valve.  There is a new band of abnormal hypodensity in the spleen which persists on delayed images, compatible with splenic infarct. I estimate that this involves about 40% of the splenic volume.  Mild lobularity of the anterior margin of the left hepatic lobe.  1.6 x 4.0 cm left adrenal mass, portal venous phase image density 36 Hounsfield units, delayed density 26 Hounsfield units, relative washout 28% (indeterminate) however, similar appearance noted on prior chest CT.  Wedge shaped the hypodensities in the right kidney lower pole persist on delayed images and are suspicious for infarcts.  3 mm left kidney lower pole nonobstructive calculus. No hydronephrosis or hydroureter. Urinary bladder unremarkable.  A small right paracentral Richter hernia is observed on image 31 of series 2, containing a small margin of the transverse colon, without current findings of strangulation.  Aortoiliac atherosclerotic  vascular disease. Left external iliac artery appears severely thinned and attenuated, Re constituting at the level of the common femoral artery No pathologic upper abdominal adenopathy is observed. No pathologic pelvic adenopathy is observed. No dilated bowel observed. Uterine and adnexal contours unremarkable.  Considerable lumbar spondylosis and degenerative disc disease noted. Hepatomegaly is present.  Mildly mobile cecum noted with the cecum in the mid abdomen. Appendix normal.  IMPRESSION: 1. 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 Valvular calcifications in the mitral and aortic valve may support a valvular source - echocardiography is likely warranted. 2. 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 3. Hepatomegaly. 4. Cardiomegaly. 5. 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 6. Lumbar  spondylosis and degenerative disc disease, with severe right foraminal stenosis at L5-S1. 7. 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 No current findings of strangulation. 8. Nonobstructive left nephrolithiasis.  Critical Value/emergent results were called by telephone at the time of interpretation on 10/28/2013 at 8:59 PM to Dr. Sherwood Gambler , who verbally acknowledged these results.   Electronically Signed   By: Sherryl Barters M.D.   On: 10/28/2013 21:02   Dg Chest Port 1 View  10/30/2013   CLINICAL DATA:  Shortness of breath, chest tightness, hypertension, COPD  EXAM: PORTABLE CHEST - 1 VIEW  COMPARISON:  10/28/2013  FINDINGS: Cardiomegaly noted with worsening diffuse interstitial opacities compatible with edema. Findings consistent with CHF. Minor basilar atelectasis. No large effusion or pneumothorax. Trachea midline.  IMPRESSION: Mild CHF pattern.   Electronically Signed   By: Daryll Brod M.D.   On: 10/30/2013 09:19    ASSESSMENT AND PLAN  1. 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 TEE  Shows no evidence of endocarditis. Source of embolus would be aortic plaque. Now on warfarin. 2. Diabetes.  3. COPD  4. Hypertension  5. Dyslipidemia.  Plan: Continue IV heparin>warfarin. When she goes home she should have a 30 day event monitor to rule out paroxysmal atrial fibrillation.  This can be arranged through our office.  Signed, Darlin Coco MD

## 2013-11-01 NOTE — Progress Notes (Addendum)
ANTICOAGULATION CONSULT NOTE - Follow Up Consult  Pharmacy Consult for Heparin, Warfarin Indication: acute splenic and right renal infarcts   Allergies  Allergen Reactions  . Sulfonamide Derivatives     Stomach cramps  . Neomycin-Bacitracin Zn-Polymyx Rash    Patient Measurements: Height: 5\' 6"  (167.6 cm) Weight: 246 lb 4.1 oz (111.7 kg) IBW/kg (Calculated) : 59.3 Heparin Dosing Weight: 85 kg  Vital Signs: Temp: 97.5 F (36.4 C) (04/18 0539) Temp src: Oral (04/18 0539) BP: 150/54 mmHg (04/18 0539) Pulse Rate: 61 (04/18 0539)  Labs:  Recent Labs  10/30/13 0100  10/31/13 0425 10/31/13 1356 11/01/13 0357  HGB 12.5  --  13.4  --  14.0  HCT 39.0  --  41.6  --  43.3  PLT 233  --  271  --  278  LABPROT  --   --   --   --  13.3  INR  --   --   --   --  1.03  HEPARINUNFRC 0.22*  < > 0.36 0.35 0.50  CREATININE 0.61  --  0.46*  --  0.46*  < > = values in this interval not displayed.  Estimated Creatinine Clearance: 81.8 ml/min (by C-G formula based on Cr of 0.46).   Medications:  Infusions:  . heparin 2,400 Units/hr (11/01/13 0433)    Assessment: 59 yoF [facility] with abdominal pain and nausea.  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  No PMH of clotting disorder or anticoagulation, but daughter has lupus anticoagulant and factor V leiden with hx thrombus.  Patient negative for Factor 5 leiden and Lupus anticoagulant.  Pharmacy consulted to dose IV heparin and warfarin.  Heparin level 0.50, remains therapeutic with infusion at 2400 units/hr  Stable CBC  No reported bleeding  INR = 1.03, started warfarin last night  Goal of Therapy:  INR 2-3 Heparin level 0.3-0.7 units/ml Monitor platelets by anticoagulation protocol: Yes   Plan:  1) Continue IV heparin 2400 units/hr 2) Warfarin 7.5mg  x 1 tonight 3) Daily INR and heparin level 4) Warfarin education materials ordered.  Hershal Coria, PharmD, BCPS Pager: [phone] 11/01/2013 7:22 AM   ADDENDUM 11/01/2013 11:07 AM Completed warfarin education with patient.  Patient demonstrated a good understanding of the safety and use of warfarin therapy (including drug/food interactions, s/sx's bleeding, INR monitoring)  Hershal Coria, PharmD, BCPS Pager: [phone] 11/01/2013 11:07 AM

## 2013-11-01 NOTE — Progress Notes (Signed)
Patient ID: Heidi Stark, female   DOB: Jan 02, 1941, 73 y.o.   MRN: [id number]  [facility] PROGRESS NOTE  GINA LEBLOND JJO:[id number] DOB: 26-May-1941 DOA: 10/28/2013 PCP: Garnet Koyanagi, DO  Brief narrative:  73 y.o. female presented to ED with L sided abdominal pain that started 2-3 days prior to this admission, sharp and constant, 7/10 in severity, worse with movement and less intense with rest, non radiating, associated with nausea, poor oral intake, subjective fevers, chills.   In ED, imagining studies notable for acute splenic and right renal infarcts suspicious for arterial embolic source.   Principal Problem:  Splenic infarct, right renal infarct  - ? Atherosclerotic disease  - 2 D ECHO with no clear embolic source identified and with normal systolic function  - TEE done and with no embolic source,no endocarditis, will continue to follow up on cardiology recommendations  - continue heparin with transition to coumadin per pharmacy  Active Problems:  Acute respiratory failure  - pt has less shortness of breath and no wheezing this AM, she has history of COPD  - continue BD's scheduled and as needed and add solumedrol  - CXR with mild CHF 4/16 and given one dose of Lasix 20 mg IV (4/16) in addition to her scheduled lasix 20 mg PO BID  - weigh trend: 246 lbs and stable over the past 48 hours, will monitor daily weights, strict I's and O's  Leukocytosis  - unclear etiology, UA suggestive of UTI but again endocarditis is in differential, CXR with bronchitic changes but no overt developing PNA  - pt is currently on Vancomycin and Rocephin day #5/7 - will follow up on urine and blood culture which are negative to date  - WBC is trending down and is WNL this AM, repeat CBC in AM  DIABETES MELLITUS, TYPE II  - A1C 6.5, indicative of good diabetic control  - continue Amaryl, also placed on SSI while inpatient  HYPERTENSION  - currently on lasix 20 mg PO BID, metoprolol 6.25 mg BID,  lisinopril 5 mg QD  - will need to readjust the regimen if BP remains uncontrolled   Consultants:  Cardiology  Procedures/Studies:  Dg Chest 2 View 10/28/2013 Stable cardiomegaly, chronic bronchitic changes and interstitial prominence without evidence of acute cardiopulmonary disease.  Ct Abdomen Pelvis W Contrast 10/28/2013 1. 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 Valvular calcifications in the mitral and aortic valve may support a valvular source - echocardiography is likely warranted. 2. 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 3. Hepatomegaly. 4. Cardiomegaly. 5. 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 6. Lumbar spondylosis and degenerative disc disease, with severe right foraminal stenosis at L5-S1. 7. 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 No current findings of strangulation. 8. Nonobstructive left nephrolithiasis. Antibiotics:  Vancomycin 4/14 -->  Rocephin 4/14 -->   Code Status: Full  Family Communication: Pt at bedside  Disposition Plan: Home when medically stable   HPI/Subjective: No events overnight.   Objective: Filed Vitals:   11/01/13 0539 11/01/13 0751 11/01/13 1131 11/01/13 1134  BP: 150/54     Pulse: 61  76   Temp: 97.5 F (36.4 C)     TempSrc: Oral     Resp: 18  18   Height:      Weight:      SpO2: 95% 93%  93%    Intake/Output Summary (Last 24 hours) at 11/01/13 1136 Last data filed at 11/01/13 0600  Gross per 24 hour  Intake   1648 ml  Output    950 ml  Net    698 ml    Exam:   General:  Pt is alert, follows commands appropriately, not in acute distress  Cardiovascular: Regular rate and rhythm, S1/S2, no murmurs, no rubs, no gallops  Respiratory: Clear to auscultation bilaterally, no wheezing, diminished breath sounds at  bases   Abdomen: Soft, non tender, non distended, bowel sounds present, no guarding  Extremities: No edema, pulses DP and PT palpable bilaterally  Neuro: Grossly nonfocal  Data Reviewed: Basic Metabolic Panel:  Recent Labs Lab 10/28/13 1809 10/29/13 0750 10/30/13 0100 10/31/13 0425 11/01/13 0357  NA 134* 135* 139 140 140  K 3.7 3.7 3.4* 3.3* 4.0  CL 94* 97 99 100 97  CO2 24 25 29 28 27   GLUCOSE 132* 189* 121* 112* 139*  BUN 20 16 15 13 14   CREATININE 0.51 0.48* 0.61 0.46* 0.46*  CALCIUM 9.1 8.3* 8.4 8.3* 9.1   Liver Function Tests:  Recent Labs Lab 10/28/13 1809  AST 33  ALT 22  ALKPHOS 59  BILITOT 0.5  PROT 7.8  ALBUMIN 3.4*    Recent Labs Lab 10/28/13 1809  LIPASE 37   CBC:  Recent Labs Lab 10/28/13 1809 10/29/13 0750 10/30/13 0100 10/31/13 0425 11/01/13 0357  WBC 15.4* 12.2* 10.9* 9.3 8.7  NEUTROABS 11.5*  --   --   --   --   HGB 14.7 13.5 12.5 13.4 14.0  HCT 45.0 40.9 39.0 41.6 43.3  MCV 87.5 86.7 89.4 87.2 87.1  PLT 220 222 233 271 278   CBG:  Recent Labs Lab 10/30/13 1707 10/31/13 0525 10/31/13 0733 10/31/13 1719 11/01/13 0706  GLUCAP 124* 131* 132* 145* 108*    Recent Results (from the past 240 hour(s))  CULTURE, BLOOD (ROUTINE X 2)     Status: None   Collection Time    10/28/13  9:35 PM      Result Value Ref Range Status   Specimen Description BLOOD LEFT ANTECUBITAL   Final   Special Requests BOTTLES DRAWN AEROBIC AND ANAEROBIC 5CC   Final   Culture  Setup Time     Final   Value: 10/29/2013 00:39     Performed at [facility]   Culture     Final   Value:        BLOOD CULTURE RECEIVED NO GROWTH TO DATE CULTURE WILL BE HELD FOR 5 DAYS BEFORE ISSUING A FINAL NEGATIVE REPORT     Performed at [facility]   Report Status PENDING   Incomplete  CULTURE, BLOOD (ROUTINE X 2)     Status: None   Collection Time    10/28/13  9:40 PM      Result Value Ref Range Status   Specimen Description BLOOD BLOOD LEFT FOREARM    Final   Special Requests BOTTLES DRAWN AEROBIC AND ANAEROBIC 3CC   Final   Culture  Setup Time     Final   Value: 10/29/2013 00:39     Performed at [facility]   Culture     Final   Value:        BLOOD CULTURE RECEIVED NO GROWTH TO DATE CULTURE WILL BE HELD FOR 5 DAYS BEFORE ISSUING A FINAL NEGATIVE REPORT     Performed at [facility]  Report Status PENDING   Incomplete  URINE CULTURE     Status: None   Collection Time    10/29/13  7:11 PM      Result Value Ref Range Status   Specimen Description URINE, CLEAN CATCH   Final   Special Requests NONE   Final   Culture  Setup Time     Final   Value: 10/30/2013 05:10     Performed at [facility] Count     Final   Value: NO GROWTH     Performed at [facility]   Culture     Final   Value: NO GROWTH     Performed at [facility]   Report Status 10/31/2013 FINAL   Final     Scheduled Meds: . albuterol  2.5 mg Nebulization TID  . Alogliptin-Metformin HCl  1 tablet Oral BID  . atorvastatin  40 mg Oral q1800  . Canagliflozin  100 mg Oral Daily  . cefTRIAXone (ROCEPHIN)  IV  2 g Intravenous Q24H  . fenofibrate  160 mg Oral Daily  . furosemide  20 mg Oral BID  . glimepiride  2 mg Oral Q supper  . insulin aspart  0-9 Units Subcutaneous TID WC  . lisinopril  5 mg Oral Daily  . methylPREDNISolone (SOLU-MEDROL) injection  40 mg Intravenous Q12H  . metoprolol tartrate  6.25 mg Oral BID  . mometasone-formoterol  2 puff Inhalation BID  . potassium chloride  10 mEq Oral BID  . sodium chloride  3 mL Intravenous Q12H  . vancomycin  1,500 mg Intravenous Q12H  . warfarin  7.5 mg Oral ONCE-1800  . warfarin   Does not apply Once  . Warfarin - Pharmacist Dosing Inpatient   Does not apply q1800   Continuous Infusions: . heparin 2,400 Units/hr (11/01/13 0433)     [name], MD  [facility] Pager [phone]  If 7PM-7AM, please contact night-coverage [email or web address] Password [email or web address] 11/01/2013,  11:36 AM   LOS: 4 days

## 2013-11-01 NOTE — Progress Notes (Signed)
RT placed patient on CPAP. Water chamber filled for humidification. Patient is tolerating well. RT will continue to monitor.

## 2013-11-02 LAB — BASIC METABOLIC PANEL
BUN: 14 mg/dL (ref 6–23)
CHLORIDE: 97 meq/L (ref 96–112)
CO2: 27 meq/L (ref 19–32)
Calcium: 9 mg/dL (ref 8.4–10.5)
Creatinine, Ser: 0.45 mg/dL — ABNORMAL LOW (ref 0.50–1.10)
GFR calc Af Amer: >90 mL/min (ref >90)
GFR calc non Af Amer: >90 mL/min (ref >90)
Glucose, Bld: 121 mg/dL — ABNORMAL HIGH (ref 70–99)
Potassium: 3.6 mEq/L — ABNORMAL LOW (ref 3.7–5.3)
Sodium: 138 mEq/L (ref 137–147)

## 2013-11-02 LAB — HEPARIN LEVEL (UNFRACTIONATED): HEPARIN UNFRACTIONATED: 0.4 [IU]/mL (ref 0.30–0.70)

## 2013-11-02 LAB — GLUCOSE, CAPILLARY
GLUCOSE-CAPILLARY: 155 mg/dL — AB (ref 70–99)
GLUCOSE-CAPILLARY: 80 mg/dL (ref 70–99)
Glucose-Capillary: 179 mg/dL — ABNORMAL HIGH (ref 70–99)

## 2013-11-02 LAB — CBC
HCT: 41.6 % (ref 36.0–46.0)
Hemoglobin: 13.5 g/dL (ref 12.0–15.0)
MCH: 28 pg (ref 26.0–34.0)
MCHC: 32.5 g/dL (ref 30.0–36.0)
MCV: 86.3 fL (ref 78.0–100.0)
PLATELETS: 294 10*3/uL (ref 150–400)
RBC: 4.82 MIL/uL (ref 3.87–5.11)
RDW: 14.9 % (ref 11.5–15.5)
WBC: 9.9 10*3/uL (ref 4.0–10.5)

## 2013-11-02 LAB — PROTIME-INR
INR: 1.64 — AB (ref 0.00–1.49)
Prothrombin Time: 19 seconds — ABNORMAL HIGH (ref 11.6–15.2)

## 2013-11-02 MED ORDER — SODIUM CHLORIDE 0.9 % IV SOLN: INTRAVENOUS | Status: DC | PRN

## 2013-11-02 MED ORDER — POTASSIUM CHLORIDE CRYS ER 20 MEQ PO TBCR
40.0000 meq | EXTENDED_RELEASE_TABLET | Freq: Once | ORAL | Status: AC
  Administered 2013-11-02: 40 meq via ORAL
  Filled 2013-11-02: qty 2

## 2013-11-02 MED ORDER — WARFARIN SODIUM 4 MG PO TABS
4.0000 mg | ORAL_TABLET | Freq: Once | ORAL | Status: AC
  Administered 2013-11-02: 4 mg via ORAL
  Filled 2013-11-02: qty 1

## 2013-11-02 MED ORDER — METOPROLOL TARTRATE 12.5 MG HALF TABLET
12.5000 mg | ORAL_TABLET | Freq: Two times a day (BID) | ORAL | Status: DC
  Administered 2013-11-02 – 2013-11-03 (×3): 12.5 mg via ORAL
  Filled 2013-11-02 (×4): qty 1

## 2013-11-02 NOTE — Progress Notes (Signed)
Patient Name: Heidi Stark Date of Encounter: 11/02/2013     Principal Problem:   Splenic infarct Active Problems:   DIABETES MELLITUS, TYPE II   HYPERTENSION   Renal infarct    SUBJECTIVE  Patient had one brief episode of LUQ pain last night which resolved without specific therapy. Rhythm is NSR.  BP still slightly high.  CURRENT MEDS . albuterol  2.5 mg Nebulization TID  . Alogliptin-Metformin HCl  1 tablet Oral BID  . atorvastatin  40 mg Oral q1800  . Canagliflozin  100 mg Oral Daily  . cefTRIAXone (ROCEPHIN)  IV  2 g Intravenous Q24H  . fenofibrate  160 mg Oral Daily  . furosemide  20 mg Oral BID  . glimepiride  2 mg Oral Q supper  . insulin aspart  0-9 Units Subcutaneous TID WC  . lisinopril  5 mg Oral Daily  . methylPREDNISolone (SOLU-MEDROL) injection  40 mg Intravenous Daily  . metoprolol tartrate  6.25 mg Oral BID  . mometasone-formoterol  2 puff Inhalation BID  . potassium chloride  10 mEq Oral BID  . sodium chloride  3 mL Intravenous Q12H  . vancomycin  1,500 mg Intravenous Q12H  . warfarin  4 mg Oral ONCE-1800  . warfarin   Does not apply Once  . Warfarin - Pharmacist Dosing Inpatient   Does not apply q1800    OBJECTIVE  Filed Vitals:   11/01/13 2127 11/01/13 2335 11/02/13 0622 11/02/13 0801  BP: 178/65  152/73   Pulse: 74 69 67   Temp: 98.4 F (36.9 C)  97.7 F (36.5 C)   TempSrc: Oral  Oral   Resp: 20 16 16    Height:      Weight:      SpO2: 96% 95% 94% 93%    Intake/Output Summary (Last 24 hours) at 11/02/13 0957 Last data filed at 11/02/13 0552  Gross per 24 hour  Intake 2169.8 ml  Output      0 ml  Net 2169.8 ml   Filed Weights   10/28/13 2152 10/28/13 2252  Weight: 260 lb (117.935 kg) 246 lb 4.1 oz (111.7 kg)    PHYSICAL EXAM  General: Pleasant, NAD. Neuro: Alert and oriented X 3. Moves all extremities spontaneously. Psych: Normal affect. HEENT:  Normal  Neck: Supple without bruits or JVD. Lungs:  Mild rhonchi Heart:  RRR no s3, s4, or murmurs. Abdomen: Soft, non-tender, non-distended, BS + x 4.  Extremities: No clubbing, cyanosis or edema. DP/PT/Radials 2+ and equal bilaterally.  Accessory Clinical Findings  CBC  Recent Labs  11/01/13 0357 11/02/13 0529  WBC 8.7 9.9  HGB 14.0 13.5  HCT 43.3 41.6  MCV 87.1 86.3  PLT 278 720   Basic Metabolic Panel  Recent Labs  11/01/13 0357 11/02/13 0529  NA 140 138  K 4.0 3.6*  CL 97 97  CO2 27 27  GLUCOSE 139* 121*  BUN 14 14  CREATININE 0.46* 0.45*  CALCIUM 9.1 9.0   Liver Function Tests No results found for this basename: AST, ALT, ALKPHOS, BILITOT, PROT, ALBUMIN,  in the last 72 hours No results found for this basename: LIPASE, AMYLASE,  in the last 72 hours Cardiac Enzymes No results found for this basename: CKTOTAL, CKMB, CKMBINDEX, TROPONINI,  in the last 72 hours BNP No components found with this basename: POCBNP,  D-Dimer No results found for this basename: DDIMER,  in the last 72 hours Hemoglobin A1C No results found for this basename: HGBA1C,  in the  last 72 hours Fasting Lipid Panel No results found for this basename: CHOL, HDL, LDLCALC, TRIG, CHOLHDL, LDLDIRECT,  in the last 72 hours Thyroid Function Tests No results found for this basename: TSH, T4TOTAL, FREET3, T3FREE, THYROIDAB,  in the last 72 hours  TELE  NSR  ECG    Radiology/Studies  Dg Chest 2 View  10/28/2013   CLINICAL DATA:  Short of breath, fever  EXAM: CHEST  2 VIEW  COMPARISON:  Prior chest x-ray 09/03/2009  FINDINGS: Stable mild cardiomegaly. Atherosclerotic calcifications noted in the transverse and descending thoracic aorta. Similar bronchitic changes and interstitial prominence particularly in the bilateral lung bases compared to prior. No focal airspace consolidation, pleural effusion or pneumothorax. No acute osseous abnormality.  IMPRESSION: Stable cardiomegaly, chronic bronchitic changes and interstitial prominence without evidence of acute  cardiopulmonary disease.   Electronically Signed   By: Jacqulynn Cadet M.D.   On: 10/28/2013 19:05   Ct Abdomen Pelvis W Contrast  10/28/2013   CLINICAL DATA:  Mid abdominal pain. Fatigue. Left lower abdominal pain.  EXAM: CT ABDOMEN AND PELVIS WITH CONTRAST  TECHNIQUE: Multidetector CT imaging of the abdomen and pelvis was performed using the standard protocol following bolus administration of intravenous contrast.  CONTRAST:  146mL OMNIPAQUE IOHEXOL 300 MG/ML  SOLN  COMPARISON:  US RENAL dated 08/12/2012; CT CHEST W/CM dated 08/14/2006  FINDINGS: Peripheral interstitial accentuation in the lung bases noted, increased from 08/14/2006. Mildly prominent pulmonary vasculature in the lower lobes.  Cardiomegaly observed with calcification of the mitral valve and aortic valve.  There is a new band of abnormal hypodensity in the spleen which persists on delayed images, compatible with splenic infarct. I estimate that this involves about 40% of the splenic volume.  Mild lobularity of the anterior margin of the left hepatic lobe.  1.6 x 4.0 cm left adrenal mass, portal venous phase image density 36 Hounsfield units, delayed density 26 Hounsfield units, relative washout 28% (indeterminate) however, similar appearance noted on prior chest CT.  Wedge shaped the hypodensities in the right kidney lower pole persist on delayed images and are suspicious for infarcts.  3 mm left kidney lower pole nonobstructive calculus. No hydronephrosis or hydroureter. Urinary bladder unremarkable.  A small right paracentral Richter hernia is observed on image 31 of series 2, containing a small margin of the transverse colon, without current findings of strangulation.  Aortoiliac atherosclerotic vascular disease. Left external iliac artery appears severely thinned and attenuated, Re constituting at the level of the common femoral artery No pathologic upper abdominal adenopathy is observed. No pathologic pelvic adenopathy is observed. No  dilated bowel observed. Uterine and adnexal contours unremarkable.  Considerable lumbar spondylosis and degenerative disc disease noted. Hepatomegaly is present.  Mildly mobile cecum noted with the cecum in the mid abdomen. Appendix normal.  IMPRESSION: 1. 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 Valvular calcifications in the mitral and aortic valve may support a valvular source - echocardiography is likely warranted. 2. 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 3. Hepatomegaly. 4. Cardiomegaly. 5. 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 6. Lumbar spondylosis and degenerative disc disease, with severe right foraminal stenosis at L5-S1. 7. 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 No current findings of strangulation. 8. Nonobstructive left nephrolithiasis.  Critical Value/emergent results were called by telephone at the time of interpretation on 10/28/2013  at 8:59 PM to Dr. Sherwood Gambler , who verbally acknowledged these results.   Electronically Signed   By: Sherryl Barters M.D.   On: 10/28/2013 21:02   Dg Chest Port 1 View  10/30/2013   CLINICAL DATA:  Shortness of breath, chest tightness, hypertension, COPD  EXAM: PORTABLE CHEST - 1 VIEW  COMPARISON:  10/28/2013  FINDINGS: Cardiomegaly noted with worsening diffuse interstitial opacities compatible with edema. Findings consistent with CHF. Minor basilar atelectasis. No large effusion or pneumothorax. Trachea midline.  IMPRESSION: Mild CHF pattern.   Electronically Signed   By: Daryll Brod M.D.   On: 10/30/2013 09:19    ASSESSMENT AND PLAN  1. 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 TEE Shows no evidence of endocarditis. Source of embolus would be aortic plaque. Now on warfarin.  2. Diabetes.  3. COPD  4.  Hypertension.  Will increase lopressor to home dose. 5. Dyslipidemia.  Plan: Continue IV heparin>warfarin.  When she goes home she should have a 30 day event monitor to rule out paroxysmal atrial fibrillation. This can be arranged through our office.   Signed, Darlin Coco MD

## 2013-11-02 NOTE — Progress Notes (Signed)
ANTICOAGULATION CONSULT NOTE - Follow Up Consult  Pharmacy Consult for Heparin, Warfarin Indication: acute splenic and right renal infarcts   Allergies  Allergen Reactions  . Sulfonamide Derivatives     Stomach cramps  . Neomycin-Bacitracin Zn-Polymyx Rash    Patient Measurements: Height: 5\' 6"  (167.6 cm) Weight: 246 lb 4.1 oz (111.7 kg) IBW/kg (Calculated) : 59.3 Heparin Dosing Weight: 85 kg  Vital Signs: Temp: 97.7 F (36.5 C) (04/19 0622) Temp src: Oral (04/19 0622) BP: 152/73 mmHg (04/19 0622) Pulse Rate: 67 (04/19 0622)  Labs:  Recent Labs  10/31/13 0425 10/31/13 1356 11/01/13 0357 11/02/13 0529  HGB 13.4  --  14.0 13.5  HCT 41.6  --  43.3 41.6  PLT 271  --  278 294  LABPROT  --   --  13.3 19.0*  INR  --   --  1.03 1.64*  HEPARINUNFRC 0.36 0.35 0.50 0.40  CREATININE 0.46*  --  0.46* 0.45*    Estimated Creatinine Clearance: 81.8 ml/min (by C-G formula based on Cr of 0.45).   Medications:  Infusions:  . heparin 2,400 Units/hr (11/02/13 0407)    Assessment: 6 yoF presented to [facility] ED on 4/14 with abdominal pain and nausea.  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  No PMH of clotting disorder or anticoagulation, but daughter has lupus anticoagulant and factor V leiden with hx thrombus.  Patient negative for Factor 5 leiden and Lupus anticoagulant.  Pharmacy consulted to dose IV heparin and warfarin.  Heparin level 0.40, remains therapeutic with infusion at 2400 units/hr  Stable CBC  No reported bleeding  INR = 1.64, increased significantly following 7.5 mg dose x 2 days  Goal of Therapy:  INR 2-3 Heparin level 0.3-0.7 units/ml Monitor platelets by anticoagulation protocol: Yes   Plan:  1) Continue IV heparin 2400 units/hr 2) Warfarin 4mg  x 1 tonight 3) Daily INR and heparin level 4) Warfarin education completed 4/18.  Hershal Coria, PharmD, BCPS Pager: [phone] 11/02/2013 7:25  AM

## 2013-11-02 NOTE — Progress Notes (Signed)
Patient ID: Heidi Stark, female   DOB: 07/29/1940, 73 y.o.   MRN: [id number]  [facility] PROGRESS NOTE  Heidi Stark SJG:[id number] DOB: [birth year] DOA: 10/28/2013 PCP: Garnet Koyanagi, DO  Brief narrative:  73 y.o. female presented to ED with L sided abdominal pain that started 2-3 days prior to this admission, sharp and constant, 7/10 in severity, worse with movement and less intense with rest, non radiating, associated with nausea, poor oral intake, subjective fevers, chills.   In ED, imagining studies notable for acute splenic and right renal infarcts suspicious for arterial embolic source.   Principal Problem:  Splenic infarct, right renal infarct  - ? Atherosclerotic disease  - 2 D ECHO with no clear embolic source identified and with normal systolic function  - TEE done and with no embolic source,no endocarditis, will continue to follow up on cardiology recommendations  - continue heparin with transition to coumadin per pharmacy  - INR still sub therapeutic, possibly home in 1-2 days when INT stable and at target range  Active Problems:  Acute respiratory failure  - pt has less shortness of breath and no wheezing this AM, she has history of COPD  - continue BD's scheduled and as needed, taper down solumedrol  - CXR with mild CHF 4/16 and given one dose of Lasix 20 mg IV (4/16) in addition to her scheduled lasix 20 mg PO BID  - weigh trend: 246 lbs and stable over the past 48 hours, will monitor daily weights, strict I's and O's  Leukocytosis  - unclear etiology, UA suggestive of UTI, TEE negative for endocarditis, CXR with bronchitic changes but no overt developing PNA  - pt is currently on Vancomycin and Rocephin day #6/7  - will follow up on urine and blood culture which are negative to date  - WBC is trending down and is WNL this AM, repeat CBC in AM  Hypokalemia - from diuresis, lasix use - will supplement and repeat BMP in AM DIABETES MELLITUS, TYPE II  - A1C 6.5,  indicative of good diabetic control  - continue Amaryl, also placed on SSI while inpatient  HYPERTENSION  - currently on lasix 20 mg PO BID, metoprolol 6.25 mg BID, lisinopril 5 mg QD  - will need to readjust the regimen if BP remains uncontrolled   Consultants:  Cardiology  Procedures/Studies:  Dg Chest 2 View 10/28/2013 Stable cardiomegaly, chronic bronchitic changes and interstitial prominence without evidence of acute cardiopulmonary disease.  Ct Abdomen Pelvis W Contrast 10/28/2013 1. 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 Valvular calcifications in the mitral and aortic valve may support a valvular source - echocardiography is likely warranted. 2. 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 3. Hepatomegaly. 4. Cardiomegaly. 5. 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 6. Lumbar spondylosis and degenerative disc disease, with severe right foraminal stenosis at L5-S1. 7. 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 No current findings of strangulation. 8. Nonobstructive left nephrolithiasis. Antibiotics:  Vancomycin 4/14 -->  Rocephin 4/14 -->   Code Status: Full  Family Communication: Pt at bedside  Disposition Plan: Home when medically stable   HPI/Subjective: No events overnight.   Objective: Filed Vitals:   11/01/13 1931 11/01/13 2127 11/01/13 2335 11/02/13 0622  BP:  178/65  152/73  Pulse:  74 69 67  Temp:  98.4 F (36.9  C)  97.7 F (36.5 C)  TempSrc:  Oral  Oral  Resp:  20 16 16   Height:      Weight:      SpO2: 93% 96% 95% 94%    Intake/Output Summary (Last 24 hours) at 11/02/13 0746 Last data filed at 11/02/13 3710  Gross per 24 hour  Intake 2409.8 ml  Output      0 ml  Net 2409.8 ml    Exam:   General:  Pt is alert, follows commands  appropriately, not in acute distress  Cardiovascular: Regular rate and rhythm, S1/S2, no murmurs, no rubs, no gallops  Respiratory: Clear to auscultation bilaterally, no wheezing, no crackles, no rhonchi  Abdomen: Soft, non tender, non distended, bowel sounds present, no guarding   Data Reviewed: Basic Metabolic Panel:  Recent Labs Lab 10/29/13 0750 10/30/13 0100 10/31/13 0425 11/01/13 0357 11/02/13 0529  NA 135* 139 140 140 138  K 3.7 3.4* 3.3* 4.0 3.6*  CL 97 99 100 97 97  CO2 25 29 28 27 27   GLUCOSE 189* 121* 112* 139* 121*  BUN 16 15 13 14 14   CREATININE 0.48* 0.61 0.46* 0.46* 0.45*  CALCIUM 8.3* 8.4 8.3* 9.1 9.0   Liver Function Tests:  Recent Labs Lab 10/28/13 1809  AST 33  ALT 22  ALKPHOS 59  BILITOT 0.5  PROT 7.8  ALBUMIN 3.4*    Recent Labs Lab 10/28/13 1809  LIPASE 37   CBC:  Recent Labs Lab 10/28/13 1809 10/29/13 0750 10/30/13 0100 10/31/13 0425 11/01/13 0357 11/02/13 0529  WBC 15.4* 12.2* 10.9* 9.3 8.7 9.9  NEUTROABS 11.5*  --   --   --   --   --   HGB 14.7 13.5 12.5 13.4 14.0 13.5  HCT 45.0 40.9 39.0 41.6 43.3 41.6  MCV 87.5 86.7 89.4 87.2 87.1 86.3  PLT 220 222 233 271 278 294   Cardiac Enzymes: No results found for this basename: CKTOTAL, CKMB, CKMBINDEX, TROPONINI,  in the last 168 hours BNP: No components found with this basename: POCBNP,  CBG:  Recent Labs Lab 10/31/13 0733 10/31/13 1719 11/01/13 0706 11/01/13 1215 11/01/13 1644  GLUCAP 132* 145* 108* 151* 100*    Recent Results (from the past 240 hour(s))  CULTURE, BLOOD (ROUTINE X 2)     Status: None   Collection Time    10/28/13  9:35 PM      Result Value Ref Range Status   Specimen Description BLOOD LEFT ANTECUBITAL   Final   Special Requests BOTTLES DRAWN AEROBIC AND ANAEROBIC 5CC   Final   Culture  Setup Time     Final   Value: 10/29/2013 00:39     Performed at [facility]   Culture     Final   Value:        BLOOD CULTURE RECEIVED NO GROWTH TO  DATE CULTURE WILL BE HELD FOR 5 DAYS BEFORE ISSUING A FINAL NEGATIVE REPORT     Performed at [facility]   Report Status PENDING   Incomplete  CULTURE, BLOOD (ROUTINE X 2)     Status: None   Collection Time    10/28/13  9:40 PM      Result Value Ref Range Status   Specimen Description BLOOD BLOOD LEFT FOREARM   Final   Special Requests BOTTLES DRAWN AEROBIC AND ANAEROBIC 3CC   Final   Culture  Setup Time     Final   Value: 10/29/2013 00:39  Performed at [facility]     Final   Value:        BLOOD CULTURE RECEIVED NO GROWTH TO DATE CULTURE WILL BE HELD FOR 5 DAYS BEFORE ISSUING A FINAL NEGATIVE REPORT     Performed at [facility]   Report Status PENDING   Incomplete  URINE CULTURE     Status: None   Collection Time    10/29/13  7:11 PM      Result Value Ref Range Status   Specimen Description URINE, CLEAN CATCH   Final   Special Requests NONE   Final   Culture  Setup Time     Final   Value: 10/30/2013 05:10     Performed at [facility]     Final   Value: NO GROWTH     Performed at [facility]   Culture     Final   Value: NO GROWTH     Performed at [facility]   Report Status 10/31/2013 FINAL   Final     Scheduled Meds: . albuterol  2.5 mg Nebulization TID  . Alogliptin-Metformin HCl  1 tablet Oral BID  . atorvastatin  40 mg Oral q1800  . Canagliflozin  100 mg Oral Daily  . cefTRIAXone (ROCEPHIN)  IV  2 g Intravenous Q24H  . fenofibrate  160 mg Oral Daily  . furosemide  20 mg Oral BID  . glimepiride  2 mg Oral Q supper  . insulin aspart  0-9 Units Subcutaneous TID WC  . lisinopril  5 mg Oral Daily  . methylPREDNISolone (SOLU-MEDROL) injection  40 mg Intravenous Daily  . metoprolol tartrate  6.25 mg Oral BID  . mometasone-formoterol  2 puff Inhalation BID  . potassium chloride  10 mEq Oral BID  . sodium chloride  3 mL Intravenous Q12H  . vancomycin  1,500 mg Intravenous Q12H  . warfarin   4 mg Oral ONCE-1800  . warfarin   Does not apply Once  . Warfarin - Pharmacist Dosing Inpatient   Does not apply q1800   Continuous Infusions: . heparin 2,400 Units/hr (11/02/13 0407)     [name], MD  [facility] Pager [phone]  If 7PM-7AM, please contact night-coverage [email or web address] Password [email or web address] 11/02/2013, 7:46 AM   LOS: 5 days

## 2013-11-03 ENCOUNTER — Other Ambulatory Visit: Payer: Self-pay | Admitting: Family Medicine

## 2013-11-03 ENCOUNTER — Encounter (HOSPITAL_COMMUNITY): Payer: Self-pay | Admitting: Cardiology

## 2013-11-03 ENCOUNTER — Other Ambulatory Visit: Payer: Self-pay | Admitting: *Deleted

## 2013-11-03 DIAGNOSIS — I480 Paroxysmal atrial fibrillation: Secondary | ICD-10-CM

## 2013-11-03 DIAGNOSIS — I4891 Unspecified atrial fibrillation: Secondary | ICD-10-CM

## 2013-11-03 LAB — BASIC METABOLIC PANEL
BUN: 22 mg/dL (ref 6–23)
CO2: 25 mEq/L (ref 19–32)
Calcium: 9.2 mg/dL (ref 8.4–10.5)
Chloride: 97 mEq/L (ref 96–112)
Creatinine, Ser: 0.5 mg/dL (ref 0.50–1.10)
GFR calc non Af Amer: >90 mL/min (ref >90)
Glucose, Bld: 107 mg/dL — ABNORMAL HIGH (ref 70–99)
POTASSIUM: 4 meq/L (ref 3.7–5.3)
Sodium: 138 mEq/L (ref 137–147)

## 2013-11-03 LAB — CBC
HCT: 44.5 % (ref 36.0–46.0)
Hemoglobin: 14.2 g/dL (ref 12.0–15.0)
MCH: 27.6 pg (ref 26.0–34.0)
MCHC: 31.9 g/dL (ref 30.0–36.0)
MCV: 86.6 fL (ref 78.0–100.0)
Platelets: 319 10*3/uL (ref 150–400)
RBC: 5.14 MIL/uL — AB (ref 3.87–5.11)
RDW: 15.2 % (ref 11.5–15.5)
WBC: 11 10*3/uL — AB (ref 4.0–10.5)

## 2013-11-03 LAB — HEPARIN LEVEL (UNFRACTIONATED): Heparin Unfractionated: 0.8 IU/mL — ABNORMAL HIGH (ref 0.30–0.70)

## 2013-11-03 LAB — GLUCOSE, CAPILLARY: Glucose-Capillary: 92 mg/dL (ref 70–99)

## 2013-11-03 LAB — PROTIME-INR
INR: 2.74 — AB (ref 0.00–1.49)
PROTHROMBIN TIME: 28.1 s — AB (ref 11.6–15.2)

## 2013-11-03 MED ORDER — HYDROCODONE-ACETAMINOPHEN 5-325 MG PO TABS: 1.0000 | ORAL_TABLET | Freq: Four times a day (QID) | ORAL | Status: DC | PRN

## 2013-11-03 MED ORDER — HEPARIN (PORCINE) IN NACL 100-0.45 UNIT/ML-% IJ SOLN
2200.0000 [IU]/h | INTRAMUSCULAR | Status: DC
  Filled 2013-11-03 (×2): qty 250

## 2013-11-03 MED ORDER — WARFARIN SODIUM 2.5 MG PO TABS: 2.5000 mg | ORAL_TABLET | Freq: Every day | ORAL | Status: DC

## 2013-11-03 MED ORDER — PREDNISONE 10 MG PO TABS: ORAL_TABLET | ORAL | Status: DC

## 2013-11-03 NOTE — Discharge Summary (Signed)
Physician Discharge Summary  Heidi Stark:[id number] DOB: 10-01-1940 DOA: 10/28/2013  PCP: Garnet Koyanagi, DO  Admit date: 10/28/2013 Discharge date: 11/03/2013  Recommendations for Outpatient Follow-up:  1. Pt will need to follow up with PCP in 2-3 weeks post discharge 2. Please obtain BMP to evaluate electrolytes and kidney function 3. Please also check CBC to evaluate Hg and Hct levels 4. Patient will need PT INR check and dose of Coumadin readjusted if indicated. Patient set up at home health nurse to come to her house and check PT/INR. It was recommended that home health nurse calls primary care physician noted above 2 alert of the values so the dose of Coumadin can be readjusted appropriately.  Discharge Diagnoses: Splenic infarct Principal Problem:   Splenic infarct Active Problems:   DIABETES MELLITUS, TYPE II   HYPERTENSION   Renal infarct   PAF (paroxysmal atrial fibrillation)  Discharge Condition: Stable  Diet recommendation: Heart healthy diet discussed in details   Brief narrative:  73 y.o. female presented to ED with L sided abdominal pain that started 2-3 days prior to this admission, sharp and constant, 7/10 in severity, worse with movement and less intense with rest, non radiating, associated with nausea, poor oral intake, subjective fevers, chills.   In ED, imagining studies notable for acute splenic and right renal infarcts suspicious for arterial embolic source.   Principal Problem:  Splenic infarct, right renal infarct  - ? Atherosclerotic disease  - 2 D ECHO with no clear embolic source identified and with normal systolic function  - TEE done and with no embolic source,no endocarditis, will continue to follow up on cardiology recommendations  - Patient transition successfully from heparin to Coumadin and tolerating well - INR therapeutic, patient was to go home Active Problems:  Acute respiratory failure  - pt has less shortness of breath and no  wheezing this AM, she has history of COPD  - continue BD's scheduled and as needed, taper down solumedrol  - CXR with mild CHF 4/16 and given one dose of Lasix 20 mg IV (4/16) in addition to her scheduled lasix 20 mg PO BID  - weigh trend: 246 lbs and stable over the past 48 hours Leukocytosis  - unclear etiology, UA suggestive of UTI, TEE negative for endocarditis, CXR with bronchitic changes but no overt developing PNA  - pt is currently on Vancomycin and Rocephin day #7/7, will stop after 2 days dose, no need for antibiotics upon discharge  - will follow up on urine and blood culture which are negative to date  - WBC is trending down and is WNL this AM Hypokalemia  - from diuresis, lasix use  DIABETES MELLITUS, TYPE II  - A1C 6.5, indicative of good diabetic control  - continue Amaryl, also placed on SSI while inpatient only HYPERTENSION  - currently on lasix 20 mg PO BID, metoprolol 6.25 mg BID, lisinopril 5 mg QD   Consultants:  Cardiology  Procedures/Studies:  Dg Chest 2 View 10/28/2013 Stable cardiomegaly, chronic bronchitic changes and interstitial prominence without evidence of acute cardiopulmonary disease.  Ct Abdomen Pelvis W Contrast 10/28/2013 1. 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 Valvular calcifications in the mitral and aortic valve may support a valvular source - echocardiography is likely warranted. 2. 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 3. Hepatomegaly. 4. Cardiomegaly. 5. Chronic left adrenal mass is nonspecific based on density characteristics but stability  from 2008 favors adenoma. 6. Lumbar spondylosis and degenerative disc disease, with severe right foraminal stenosis at L5-S1. 7. 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 No current findings of strangulation. 8. Nonobstructive left nephrolithiasis. Antibiotics:   Vancomycin 4/14 -->  Rocephin 4/14 -->   Code Status: Full  Family Communication: Pt at bedside   Discharge Exam: Filed Vitals:   11/03/13 0450  BP: 143/64  Pulse: 58  Temp: 97.7 F (36.5 C)  Resp: 16   Filed Vitals:   11/02/13 2020 11/02/13 2330 11/03/13 0450 11/03/13 0751  BP: 155/58  143/64   Pulse: 67  58   Temp: 97.6 F (36.4 C)  97.7 F (36.5 C)   TempSrc: Oral  Oral   Resp: 18 16 16    Height:      Weight:      SpO2: 94%  97% 94%    General: Pt is alert, follows commands appropriately, not in acute distress Cardiovascular: Regular rate and rhythm, S1/S2 +, no murmurs, no rubs, no gallops Respiratory: Clear to auscultation bilaterally, no wheezing, no crackles, no rhonchi Abdominal: Soft, non tender, non distended, bowel sounds +, no guarding Extremities: no edema, no cyanosis, pulses palpable bilaterally DP and PT Neuro: Grossly nonfocal  Discharge Instructions  Discharge Orders   Future Orders Complete By Expires   Diet - low sodium heart healthy  As directed    Increase activity slowly  As directed        Medication List    STOP taking these medications       aspirin 81 MG tablet      TAKE these medications       albuterol (2.5 MG/3ML) 0.083% nebulizer solution  Commonly known as:  PROVENTIL  Take 3 mLs (2.5 mg total) by nebulization 4 (four) times daily as needed for wheezing.     fenofibrate 160 MG tablet  Take 160 mg by mouth daily.     Fluticasone-Salmeterol 250-50 MCG/DOSE Aepb  Commonly known as:  ADVAIR  Inhale 1 puff into the lungs every 12 (twelve) hours. As needed for shortness of breath     furosemide 20 MG tablet  Commonly known as:  LASIX  Take 20 mg by mouth 2 (two) times daily.     glimepiride 2 MG tablet  Commonly known as:  AMARYL  Take 2 mg by mouth daily with supper.     glucose blood test strip  Check Blood Sugars four time daily     HYDROcodone-acetaminophen 5-325 MG per tablet  Commonly known as:   NORCO/VICODIN  Take 1-2 tablets by mouth every 6 (six) hours as needed for moderate pain or severe pain.     INVOKANA 100 MG Tabs  Generic drug:  Canagliflozin  Take 100 mg by mouth daily.     KAZANO 12.11-998 MG Tabs  Generic drug:  Alogliptin-Metformin HCl  Take 1 tablet by mouth 2 (two) times daily.     lisinopril 5 MG tablet  Commonly known as:  PRINIVIL,ZESTRIL  Take 5 mg by mouth daily.     metoprolol tartrate 25 MG tablet  Commonly known as:  LOPRESSOR  Take 12.5 mg by mouth 2 (two) times daily.     potassium chloride 10 MEQ tablet  Commonly known as:  K-DUR  Take 10 mEq by mouth 2 (two) times daily.     predniSONE 10 MG tablet  Commonly known as:  DELTASONE  Take 50 mg tablet today and taper down by 10 mg  daily until completed     rosuvastatin 20 MG tablet  Commonly known as:  CRESTOR  Take 20 mg by mouth daily.     warfarin 2.5 MG tablet  Commonly known as:  COUMADIN  Take 1 tablet (2.5 mg total) by mouth daily. Start taking tomorrow 11/04/2013  Start taking on:  11/04/2013           Follow-up Information   Schedule an appointment as soon as possible for a visit with Garnet Koyanagi, DO.   Specialty:  Family Medicine   Contact information:   [phone] [place] [address] [phone]       Schedule an appointment as soon as possible for a visit with [name], MD.   Specialty:  Cardiology   Contact information:   [id number] [place] [address] [phone]       Follow up with Faye Ramsay, MD. (As needed, If symptoms worsen, call my cell phone  [phone])    Specialty:  Internal Medicine   Contact information:   [address] [place] [address] [phone]        The results of significant diagnostics from this hospitalization (including imaging, microbiology, ancillary and laboratory) are listed below for reference.     Microbiology: Recent Results (from the past 240 hour(s))   CULTURE, BLOOD (ROUTINE X 2)     Status: None   Collection Time    10/28/13  9:35 PM      Result Value Ref Range Status   Specimen Description BLOOD LEFT ANTECUBITAL   Final   Special Requests BOTTLES DRAWN AEROBIC AND ANAEROBIC 5CC   Final   Culture  Setup Time     Final   Value: 10/29/2013 00:39     Performed at [facility]   Culture     Final   Value:        BLOOD CULTURE RECEIVED NO GROWTH TO DATE CULTURE WILL BE HELD FOR 5 DAYS BEFORE ISSUING A FINAL NEGATIVE REPORT     Performed at [facility]   Report Status PENDING   Incomplete  CULTURE, BLOOD (ROUTINE X 2)     Status: None   Collection Time    10/28/13  9:40 PM      Result Value Ref Range Status   Specimen Description BLOOD BLOOD LEFT FOREARM   Final   Special Requests BOTTLES DRAWN AEROBIC AND ANAEROBIC 3CC   Final   Culture  Setup Time     Final   Value: 10/29/2013 00:39     Performed at [facility]   Culture     Final   Value:        BLOOD CULTURE RECEIVED NO GROWTH TO DATE CULTURE WILL BE HELD FOR 5 DAYS BEFORE ISSUING A FINAL NEGATIVE REPORT     Performed at [facility]   Report Status PENDING   Incomplete  URINE CULTURE     Status: None   Collection Time    10/29/13  7:11 PM      Result Value Ref Range Status   Specimen Description URINE, CLEAN CATCH   Final   Special Requests NONE   Final   Culture  Setup Time     Final   Value: 10/30/2013 05:10     Performed at [facility]     Final   Value: NO GROWTH     Performed at [facility]  Culture     Final   Value: NO GROWTH     Performed at [facility]   Report Status 10/31/2013 FINAL   Final     Labs: Basic Metabolic Panel:  Recent Labs Lab 10/30/13 0100 10/31/13 0425 11/01/13 0357 11/02/13 0529 11/03/13 0415  NA 139 140 140 138 138  K 3.4* 3.3* 4.0 3.6* 4.0  CL 99 100 97 97 97  CO2 29 28 27 27 25   GLUCOSE 121* 112* 139* 121* 107*  BUN 15 13 14 14 22   CREATININE 0.61  0.46* 0.46* 0.45* 0.50  CALCIUM 8.4 8.3* 9.1 9.0 9.2   Liver Function Tests:  Recent Labs Lab 10/28/13 1809  AST 33  ALT 22  ALKPHOS 59  BILITOT 0.5  PROT 7.8  ALBUMIN 3.4*    Recent Labs Lab 10/28/13 1809  LIPASE 37   No results found for this basename: AMMONIA,  in the last 168 hours CBC:  Recent Labs Lab 10/28/13 1809  10/30/13 0100 10/31/13 0425 11/01/13 0357 11/02/13 0529 11/03/13 0415  WBC 15.4*  < > 10.9* 9.3 8.7 9.9 11.0*  NEUTROABS 11.5*  --   --   --   --   --   --   HGB 14.7  < > 12.5 13.4 14.0 13.5 14.2  HCT 45.0  < > 39.0 41.6 43.3 41.6 44.5  MCV 87.5  < > 89.4 87.2 87.1 86.3 86.6  PLT 220  < > 233 271 278 294 319  < > = values in this interval not displayed. Cardiac Enzymes: No results found for this basename: CKTOTAL, CKMB, CKMBINDEX, TROPONINI,  in the last 168 hours BNP: BNP (last 3 results) No results found for this basename: PROBNP,  in the last 8760 hours CBG:  Recent Labs Lab 11/01/13 1644 11/02/13 0744 11/02/13 1215 11/02/13 1657 11/03/13 0717  GLUCAP 100* 155* 80 179* 92     SIGNED: Time coordinating discharge: Over 30 minutes  [name], MD  [facility] 11/03/2013, 11:10 AM Pager [phone]  If 7PM-7AM, please contact night-coverage [email or web address] Password [email or web address]

## 2013-11-03 NOTE — Progress Notes (Signed)
SUBJECTIVE:  Had a few episodes of PAF/flutter yesterday.  No complaints  OBJECTIVE:   Vitals:   Filed Vitals:   11/02/13 1954 11/02/13 2020 11/02/13 2330 11/03/13 0450  BP:  155/58  143/64  Pulse:  67  58  Temp:  97.6 F (36.4 C)  97.7 F (36.5 C)  TempSrc:  Oral  Oral  Resp:  18 16 16   Height:      Weight:      SpO2: 92% 94%  97%   I&O's:   Intake/Output Summary (Last 24 hours) at 11/03/13 0753 Last data filed at 11/03/13 0700  Gross per 24 hour  Intake   2448 ml  Output      0 ml  Net   2448 ml   TELEMETRY: Reviewed telemetry pt in NSR with short bursts of PAF/flutter     PHYSICAL EXAM General: Well developed, well nourished, in no acute distress Head: Eyes PERRLA, No xanthomas.   Normal cephalic and atramatic  Lungs:   Clear bilaterally to auscultation and percussion. Heart:   HRRR S1 S2 Pulses are 2+ & equal. Abdomen: Bowel sounds are positive, abdomen soft and non-tender without masses  Extremities:   No clubbing, cyanosis or edema.  DP +1 Neuro: Alert and oriented X 3. Psych:  Good affect, responds appropriately   LABS: Basic Metabolic Panel:  Recent Labs  11/02/13 0529 11/03/13 0415  NA 138 138  K 3.6* 4.0  CL 97 97  CO2 27 25  GLUCOSE 121* 107*  BUN 14 22  CREATININE 0.45* 0.50  CALCIUM 9.0 9.2   Liver Function Tests: No results found for this basename: AST, ALT, ALKPHOS, BILITOT, PROT, ALBUMIN,  in the last 72 hours No results found for this basename: LIPASE, AMYLASE,  in the last 72 hours CBC:  Recent Labs  11/02/13 0529 11/03/13 0415  WBC 9.9 11.0*  HGB 13.5 14.2  HCT 41.6 44.5  MCV 86.3 86.6  PLT 294 319   Cardiac Enzymes: No results found for this basename: CKTOTAL, CKMB, CKMBINDEX, TROPONINI,  in the last 72 hours BNP: No components found with this basename: POCBNP,  D-Dimer: No results found for this basename: DDIMER,  in the last 72 hours Hemoglobin A1C: No results found for this basename: HGBA1C,  in the last 72  hours Fasting Lipid Panel: No results found for this basename: CHOL, HDL, LDLCALC, TRIG, CHOLHDL, LDLDIRECT,  in the last 72 hours Thyroid Function Tests: No results found for this basename: TSH, T4TOTAL, FREET3, T3FREE, THYROIDAB,  in the last 72 hours Anemia Panel: No results found for this basename: VITAMINB12, FOLATE, FERRITIN, TIBC, IRON, RETICCTPCT,  in the last 72 hours Coag Panel:   Lab Results  Component Value Date   INR 2.74* 11/03/2013   INR 1.64* 11/02/2013   INR 1.03 11/01/2013    RADIOLOGY: Dg Chest 2 View  10/28/2013   CLINICAL DATA:  Short of breath, fever  EXAM: CHEST  2 VIEW  COMPARISON:  Prior chest x-ray 09/03/2009  FINDINGS: Stable mild cardiomegaly. Atherosclerotic calcifications noted in the transverse and descending thoracic aorta. Similar bronchitic changes and interstitial prominence particularly in the bilateral lung bases compared to prior. No focal airspace consolidation, pleural effusion or pneumothorax. No acute osseous abnormality.  IMPRESSION: Stable cardiomegaly, chronic bronchitic changes and interstitial prominence without evidence of acute cardiopulmonary disease.   Electronically Signed   By: Jacqulynn Cadet M.D.   On: 10/28/2013 19:05   Ct Abdomen Pelvis W Contrast  10/28/2013   CLINICAL  DATA:  Mid abdominal pain. Fatigue. Left lower abdominal pain.  EXAM: CT ABDOMEN AND PELVIS WITH CONTRAST  TECHNIQUE: Multidetector CT imaging of the abdomen and pelvis was performed using the standard protocol following bolus administration of intravenous contrast.  CONTRAST:  189mL OMNIPAQUE IOHEXOL 300 MG/ML  SOLN  COMPARISON:  US RENAL dated 08/12/2012; CT CHEST W/CM dated 08/14/2006  FINDINGS: Peripheral interstitial accentuation in the lung bases noted, increased from 08/14/2006. Mildly prominent pulmonary vasculature in the lower lobes.  Cardiomegaly observed with calcification of the mitral valve and aortic valve.  There is a new band of abnormal hypodensity in the  spleen which persists on delayed images, compatible with splenic infarct. I estimate that this involves about 40% of the splenic volume.  Mild lobularity of the anterior margin of the left hepatic lobe.  1.6 x 4.0 cm left adrenal mass, portal venous phase image density 36 Hounsfield units, delayed density 26 Hounsfield units, relative washout 28% (indeterminate) however, similar appearance noted on prior chest CT.  Wedge shaped the hypodensities in the right kidney lower pole persist on delayed images and are suspicious for infarcts.  3 mm left kidney lower pole nonobstructive calculus. No hydronephrosis or hydroureter. Urinary bladder unremarkable.  A small right paracentral Richter hernia is observed on image 31 of series 2, containing a small margin of the transverse colon, without current findings of strangulation.  Aortoiliac atherosclerotic vascular disease. Left external iliac artery appears severely thinned and attenuated, Re constituting at the level of the common femoral artery No pathologic upper abdominal adenopathy is observed. No pathologic pelvic adenopathy is observed. No dilated bowel observed. Uterine and adnexal contours unremarkable.  Considerable lumbar spondylosis and degenerative disc disease noted. Hepatomegaly is present.  Mildly mobile cecum noted with the cecum in the mid abdomen. Appendix normal.  IMPRESSION: 1. 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 Valvular calcifications in the mitral and aortic valve may support a valvular source - echocardiography is likely warranted. 2. 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 3. Hepatomegaly. 4. Cardiomegaly. 5. 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 6. Lumbar spondylosis and degenerative disc disease, with severe right foraminal stenosis at L5-S1. 7. 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 No current findings of strangulation. 8. Nonobstructive left nephrolithiasis.  Critical Value/emergent results were called by telephone at the time of interpretation on 10/28/2013 at 8:59 PM to Dr. Sherwood Gambler , who verbally acknowledged these results.   Electronically Signed   By: Sherryl Barters M.D.   On: 10/28/2013 21:02   Dg Chest Port 1 View  10/30/2013   CLINICAL DATA:  Shortness of breath, chest tightness, hypertension, COPD  EXAM: PORTABLE CHEST - 1 VIEW  COMPARISON:  10/28/2013  FINDINGS: Cardiomegaly noted with worsening diffuse interstitial opacities compatible with edema. Findings consistent with CHF. Minor basilar atelectasis. No large effusion or pneumothorax. Trachea midline.  IMPRESSION: Mild CHF pattern.   Electronically Signed   By: Daryll Brod M.D.   On: 10/30/2013 09:19   ASSESSMENT AND PLAN  1. 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 TEE Shows no evidence of endocarditis. Source of embolus most likely asymptomatic PAF.  Now on warfarin with therapeutic INR 2. Diabetes.  3. PAF/flutter - she had several runs yesterday.  This is most likely source of infarcts.  Her rate is controlled and would  increase Lopressor to 25mg  BID for suppression of PAF.   4. Hypertension. Continue lopressor   5. Dyslipidemia.   Plan:  1.  Continue warfarin. OK to stop Heparin since she is therapeutic 2.  When she goes home she should have a 30 day event monitor to assess PAF burden. This will be arranged through our office.  3.  She needs followup with Dr. [name] as an outpt 4.  Set up warfarin followup in our office       Sueanne Margarita, MD  11/03/2013  7:53 AM

## 2013-11-03 NOTE — Discharge Instructions (Signed)
Warfarin: What You Need to Know Warfarin is an anticoagulant. Anticoagulants help prevent the formation of blood clots. They also help stop the growth of blood clots. Warfarin is sometimes referred to as a "blood thinner."  Normally, when body tissues are cut or damaged, the blood clots in order to prevent blood loss. Sometimes clots form inside your blood vessels and obstruct the flow of blood through your circulatory system (thrombosis). These clots may travel through your bloodstream and become lodged in smaller blood vessels in your brain, which can cause a stroke, or your lungs (pulmonary embolism). WHO SHOULD USE WARFARIN? Warfarin is prescribed for people at risk of developing harmful blood clots:  People with surgically implanted mechanical heart valves, irregular heart rhythms called atrial fibrillation, and certain clotting disorders.  People who have developed harmful blood clotting in the past, including those who have had a stroke or a pulmonary embolism, or thrombosis in their legs (deep vein thrombosis [DVT]).  People with an existing blood clot such as a pulmonary embolism. WARFARIN DOSING Warfarin tablets come in different strengths. Each tablet strength is a different color, with the amount of warfarin (in milligrams) clearly printed on the tablet. If the color of your tablet is different than usual when you receive a new prescription, report it immediately to your pharmacist or health care provider. WARFARIN MONITORING The goal of warfarin therapy is to lessen the clotting tendency of blood but not to prevent clotting completely. Your health care provider will monitor the anticoagulation effect of warfarin closely and adjust your dose as needed. For your safety, blood tests called prothrombin time (PT) or international normalized ratio (INR) are used to measure the effects of warfarin. Both of these tests can be done with a finger stick or a blood draw. The longer it takes the blood  to clot, the higher the PT or INR. Your health care provider will inform you of your "target" PT or INR range. If, at any time, your PT or INR is above the target range, there is a risk of bleeding. If your PT or INR is below the target range, there is a risk of clotting. Whether you are started on warfarin while you are in the hospital, or in your health care provider's office, you will need to have your PT or INR checked within one week of starting the medicine. Initially, some people are asked to have their PT or INR checked as much as twice a week. Once you are on a stable maintenance dose, the PT or INR is checked less often, usually once every 2 to 4 weeks. The warfarin dose may be adjusted if the PT or INR is not within the target range. It is important to keep all laboratory and health care provider follow-up appointments.  WHAT ARE THE SIDE EFFECTS OF WARFARIN?  Too much warfarin can cause bleeding (hemorrhage) from any part of the body. This may include bleeding from the gums, blood in the urine, bloody or dark stools, a nosebleed that is not easily stopped, coughing up blood, or vomiting blood.  Too little warfarin can increase the risk of blood clots.  Too little or too much warfarin can also increase the risk of a stroke.  Warfarin use may cause a skin rash or irritation, an unusual fever, continual nausea or stomach upset, or severe pain in your joints or back. SPECIAL PRECAUTIONS WHILE TAKING WARFARIN Warfarin should be taken exactly as directed:  Take your medicine at the same time every day.  If you forget to take your dose, you can take it if it is within 6 hours of when it was due.  Do not change the dose of warfarin on your own to make up for missed or extra doses.  If you miss more than 2 doses in a row, you should contact your health care provider for advice. Avoid situations that cause bleeding. You may have a tendency to bleed more easily than usual while taking warfarin.  The following actions can limit bleeding:  Using a softer toothbrush.  Flossing with waxed floss rather than unwaxed floss.  Shaving with an Copy rather than a blade.  Limiting the use of sharp objects.  Avoiding potentially harmful activities such as contact sports. Warfarin and Pregnancy or Breastfeeding  Warfarin is not advised during the first trimester of pregnancy due to an increased risk of birth defects. In certain situations, a woman may take warfarin after her first trimester of pregnancy. A woman who becomes pregnant or plans to become pregnant while taking warfarin should notify her health care provider immediately.  Although warfarin does not pass into breast milk, a woman who wishes to breastfeed while taking warfarin should also consult with her health care provider. Alcohol, Smoking, and Illicit Drug Use  Alcohol affects how warfarin works in the body. It is best to avoid alcoholic drinks or consume very small amounts while taking warfarin. In general, alcohol intake should be limited to 1 oz (30 mL) of liquor, 6 oz (180 mL) of wine, or 12 oz (360 mL) of beer each day. Notify your health care provider if you change your alcohol intake.  Smoking affects how warfarin works. It is best to avoid smoking while taking warfarin. Notify your health care provider if you change your smoking habits.  It is best to avoid all illicit drugs while taking warfarin since there are few studies that show how warfarin interacts with these drugs. Other Medicines and Dietary Supplements Many prescription and over-the-counter medicines can interfere with warfarin. Be sure all of your health care providers know you are taking warfarin. Notify your health care provider who prescribed warfarin for you before starting or stopping any new medicines, including over-the-counter vitamins, dietary supplements, and pain medicines. Your warfarin dose may need to be adjusted. Some common  over-the-counter medicines that may increase the risk of bleeding while taking warfarin include:   Acetaminophen.  Aspirin.  Nonsteroidal anti-inflammatory medicines such as ibuprofen or naproxen.  Vitamin E. Dietary Considerations  Foods that have moderate or high amounts of vitamin K can interfere with warfarin. Avoid major changes in your diet or notify your health care provider before changing your diet. Eat a consistent amount of foods that have moderate or high amounts of vitamin K.Eating less foods containing vitamin K can increase the risk of bleeding. Eating more foods containing vitamin K can increase the risk of blood clots. Additional questions about dietary considerations can be discussed with a dietitian. The serving size for foods containing moderate or high amounts of vitamin K are  cup cooked (120 mL or noted gram weight) or 1 cup raw (240 mL or noted gram weight), unless otherwise noted. These foods include: Proteins  Beef liver, 3.5 oz (100 g).  Pork liver, 3.5 oz (100 g). Legumes  Soybean oil.  Soybeans.  Garbanzo beans.  Green peas.  Black-eyed peas. Leafy green vegetables  Kale.  Spinach.  Nettle greens.  Swiss chard.  Watercress.  Endive.  Parsley, 1 tbsp (4 g).  Turnip greens.  Collard greens.  Seaweed, limit 2 sheets.  Beet greens.  Dandelion greens.  Mustard greens.  Green Lead and Romaine lettuce. Cruciferous vegetables  Broccoli.  Cabbage (green or [place]).  Brussels sprouts.  Cauliflower.  Asparagus. Miscellaneous  Onions, green onions, or spring onions.  Green tea made with  oz (14 g) or more of dried tea.  Herbal teas containing coumarin.  Spinach noodles.  Okra.  Prunes.  Angie Fava. CALL YOUR CLINIC OR HEALTH CARE PROVIDER IF YOU:  Plan to have any surgery or procedure.  Feel sick, especially if you have diarrhea or vomiting.  Experience or anticipate any major changes in your diet.  Start or  stop a prescription or over-the-counter medicine.  Become, plan to become, or think you may be pregnant.  Are having heavier than usual menstrual periods.  Have had a fall, accident, or any symptoms of bleeding or unusual bruising.  An unusual fever. CALL 911 IN THE [place] OR GO TO THE EMERGENCY DEPARTMENT IF YOU:   Think you may be having an allergic reaction to warfarin. The signs of an allergic reaction could include itching, rash, hives, swelling, chest tightness, or trouble breathing.  See signs of blood in your urine. The signs could include reddish, pinkish, or tea-colored urine.  See signs of blood in your stools. The signs could include bright red or black stools.  Vomit or cough up blood. In these instances, the blood could have either a bright red or a "coffee-grounds" appearance.  Have bleeding that will not stop after applying pressure for 30 minutes such as cuts, nosebleeds, other injuries.  Have severe pain in your joints or back.  Have a new and severe headache.  Have sudden weakness or numbness of your face, arm, or leg, especially on one side of your body.  Have sudden confusion or trouble understanding.  Have sudden trouble seeing in one or both eyes.  Have sudden trouble walking, dizziness, loss of balance, or coordination.  Have aphasia. Document Released: 07/03/2005 Document Revised: 03/27/2012 Document Reviewed: 12/27/2012 [facility] Patient Information 2014 [place]. Warfarin tablets What is this medicine? WARFARIN (WAR far in) is an anticoagulant. It is used to treat or prevent clots in the veins, arteries, lungs, or heart. This medicine may be used for other purposes; ask your health care provider or pharmacist if you have questions. COMMON BRAND NAME(S): Coumadin, Jantoven  What should I tell my health care provider before I take this medicine? They need to know if you have any of these conditions: -alcoholism -anemia -bleeding  disorders -cancer -diabetes -heart disease -high blood pressure -history of bleeding in the gastrointestinal tract -history of stroke or other brain injury or disease -kidney or liver disease -protein C deficiency -protein S deficiency -psychosis or dementia -recent injury, recent or planned surgery or procedure -an unusual or allergic reaction to warfarin, other medicines, foods, dyes, or preservatives -pregnant or trying to get pregnant -breast-feeding How should I use this medicine? Take this medicine by mouth with a glass of water. Follow the directions on the prescription label. You can take this medicine with or without food. Take your medicine at the same time each day. Do not take it more often than directed. Do not stop taking except on your doctor's advice. Stopping this medicine may increase your risk of a blood clot. Be sure to refill your prescription before you run out of medicine. If your doctor or healthcare professional calls to change your dose, write down the dose  and any other instructions. Always read the dose and instructions back to him or her to make sure you understand them. Tell your doctor or healthcare professional what strength of tablets you have on hand. Ask how many tablets you should take to equal your new dose. Write the date on the new instructions and keep them near your medicine. If you are told to stop taking your medicine until your next blood test, call your doctor or healthcare professional if you do not hear anything within 24 hours of the test to find out your new dose or when to restart your prior dose. A special MedGuide will be given to you by the pharmacist with each prescription and refill. Be sure to read this information carefully each time. Talk to your pediatrician regarding the use of this medicine in children. Special care may be needed. Overdosage: If you think you have taken too much of this medicine contact a poison control center or  emergency room at once. NOTE: This medicine is only for you. Do not share this medicine with others. What if I miss a dose? It is important not to miss a dose. If you miss a dose, call your healthcare provider. Take the dose as soon as possible on the same day. If it is almost time for your next dose, take only that dose. Do not take double or extra doses to make up for a missed dose. What may interact with this medicine? Do not take this medicine with any of the following medications: -agents that prevent or dissolve blood clots -aspirin or other salicylates -danshen -dextrothyroxine -mifepristone -St. John's Wort -red yeast rice This medicine may also interact with the following medications: -acetaminophen -agents that lower cholesterol -alcohol -allopurinol -amiodarone -antibiotics or medicines for treating bacterial, fungal or viral infections -azathioprine -barbiturate medicines for inducing sleep or treating seizures -certain medicines for diabetes -certain medicines for heart rhythm problems -certain medicines for high blood pressure -chloral hydrate -cisapride -disulfiram -female hormones, including contraceptive or birth control pills -general anesthetics -herbal or dietary products like garlic, ginkgo, ginseng, green tea, or kava kava -influenza virus vaccine -female hormones -medicines for mental depression or psychosis -medicines for some types of cancer -medicines for stomach problems -methylphenidate -NSAIDs, medicines for pain and inflammation, like ibuprofen or naproxen -propoxyphene -quinidine, quinine -raloxifene -seizure or epilepsy medicine like carbamazepine, phenytoin, and valproic acid -steroids like cortisone and prednisone -tamoxifen -thyroid medicine -tramadol -vitamin c, vitamin e, and vitamin K -zafirlukast -zileuton This list may not describe all possible interactions. Give your health care provider a list of all the medicines, herbs,  non-prescription drugs, or dietary supplements you use. Also tell them if you smoke, drink alcohol, or use illegal drugs. Some items may interact with your medicine. What should I watch for while using this medicine? Visit your doctor or health care professional for regular checks on your progress. You will need to have a blood test called a PT/INR regularly. The PT/INR blood test is done to make sure you are getting the right dose of this medicine. It is important to not miss your appointment for the blood tests. When you first start taking this medicine, these tests are done often. Once the correct dose is determined and you take your medicine properly, these tests can be done less often. Notify your doctor or health care professional and seek emergency treatment if you develop breathing problems; changes in vision; chest pain; severe, sudden headache; pain, swelling, warmth in the leg; trouble speaking; sudden  numbness or weakness of the face, arm or leg. These can be signs that your condition has gotten worse. While you are taking this medicine, carry an identification card with your name, the name and dose of medicine(s) being used, and the name and phone number of your doctor or health care professional or person to contact in an emergency. Do not start taking or stop taking any medicines or over-the-counter medicines except on the advice of your doctor or health care professional. You should discuss your diet with your doctor or health care professional. Do not make major changes in your diet. Vitamin K can affect how well this medicine works. Many foods contain vitamin K. It is important to eat a consistent amount of foods with vitamin K. Other foods with vitamin K that you should eat in consistent amounts are asparagus, basil, beef or pork liver, black eyed peas, broccoli, brussel sprouts, cabbage, chickpeas, cucumber with peel, green onions, green tea, okra, parsley, peas, thyme, and green leafy  vegetables like beet greens, collard greens, endive, kale, mustard greens, spinach, turnip greens, watercress, or certain lettuces like green leaf or romaine. This medicine can cause birth defects or bleeding in an unborn child. Women of childbearing age should use effective birth control while taking this medicine. If a woman becomes pregnant while taking this medicine, she should discuss the potential risks and her options with her health care professional. Avoid sports and activities that might cause injury while you are using this medicine. Severe falls or injuries can cause unseen bleeding. Be careful when using sharp tools or knives. Consider using an Copy. Take special care brushing or flossing your teeth. Report any injuries, bruising, or red spots on the skin to your doctor or health care professional. If you have an illness that causes vomiting, diarrhea, or fever for more than a few days, contact your doctor. Also check with your doctor if you are unable to eat for several days. These problems can change the effect of this medicine. Even after you stop taking this medicine, it takes several days before your body recovers its normal ability to clot blood. Ask your doctor or health care professional how long you need to be careful. If you are going to have surgery or dental work, tell your doctor or health care professional that you have been taking this medicine. What side effects may I notice from receiving this medicine? Side effects that you should report to your doctor or health care professional as soon as possible: -back pain -chills -dizziness -fever -heavy menstrual bleeding or vaginal bleeding -painful, blue, or purple toes -painful, prolonged erection -signs and symptoms of bleeding such as bloody or black, tarry stools; red or dark-brown urine; spitting up blood or brown material that looks like coffee grounds; red spots on the skin; unusual bruising or bleeding from the  eye, gums, or nose-skin rash, itching or skin damage -stomach pain -unusually weak or tired -yellowing of skin or eyes Side effects that usually do not require medical attention (report to your doctor or health care professional if they continue or are bothersome): -diarrhea -hair loss This list may not describe all possible side effects. Call your doctor for medical advice about side effects. You may report side effects to FDA at 1-800-FDA-1088. Where should I keep my medicine? Keep out of the reach of children. Store at room temperature between 15 and 30 degrees C (59 and 86 degrees F). Protect from light. Throw away any unused medicine after the  expiration date. Do not flush down the toilet. NOTE: This sheet is a summary. It may not cover all possible information. If you have questions about this medicine, talk to your doctor, pharmacist, or health care provider.  2014, [employer]. (2013-01-22 12:17:56)   Atherosclerosis Atherosclerosis, or hardening of the arteries, is the buildup of plaque within the major arteries in the body. Plaque is made up of fats (lipids), cholesterol, calcium, and fibrous tissue. Plaque can narrow or block blood flow within an artery. Plaque can break off and cause damage to the affected organ. Plaque can also "rupture." When plaque ruptures within an artery, a clot can form, causing a sudden (acute) blockage of the artery. Untreated atherosclerosis can cause serious health problems or death.  COMMON ATHEROSCLEROSIS RISK FACTORS  High cholesterol levels.  Smoking.  Obesity.  Lack of activity or exercise.  Eating a diet high in saturated fat.  Family history.  Diabetes. SYMPTOMS  Symptoms of atherosclerosis can occur when blood flow to an artery is slowed or blocked. Severity and onset of symptoms depends on how extensive the narrowing or blockage is. A sudden plaque rupture can bring immediate, life-threatening symptoms. Atherosclerosis can  affect different arteries in the body, for example:  Coronary arteries. The coronary arteries supply the heart with blood. When the coronary arteries are narrowed or blocked from atherosclerosis, this is known as coronary artery disease (CAD). CAD can cause a heart attack. Common heart attack symptoms include:  Chest pain or pain that radiates to the neck, arm, jaw, or in the upper, middle back (mid-scapular pain).  Shortness of breath without cause.  Profuse sweating while at rest.  Irregular heartbeats.  Nausea or gastrointestinal upset.  Carotid arteries. The carotid arteries supply the brain with blood. They are located on each side of your neck. When blood flow to these arteries is slowed or blocked, a transiant ischemic attack (TIA) or stroke can occur. A TIA is considered a "mini-stroke" or "warning stroke." TIA symptoms are the same as stroke symptoms, but they are temporary and last less than 24 hours. A stroke can cause permanent damage or death. Common TIA and stroke symptoms include:  Sudden numbness or weakness to one side of your body, such as the face, arm, or leg.  Sudden confusion or trouble speaking or understanding.  Sudden trouble seeing out of one or both eyes.  Sudden trouble walking, loss of balance, or dizziness.  Sudden, severe headache with no known cause.  Arteries in the legs. When arteries in the lower legs become narrowed or blocked, this is known as peripheral vascular disease(PVD). PVD can cause a symptom called claudication. Claudication is pain or a burning feeling in your legs when walking or exercising and usually goes away with rest. Very severe PVD can cause pain in your legs while at rest.  Renal arteries. The renal arteries supply the kidneys with blood. Blockage of the renal arteries can cause a decline in kidney function or high blood pressure (hypertension).  Gastrointestinal arteries (mesenteric circulation). Abdominal pain may occur after  eating. DIAGNOSIS  Your caregiver may perform the following tests to diagnose atherosclerosis:  Blood tests.  Stress Test.  Echocardiogram.  Nuclear scan.  Ankle/brachial index.  Ultrasonography.  Computed tomography (CT) scan.  Angiography. TREATMENT  Atherosclerosis treatment includes the following:  Lifestyle changes such as:  Quitting smoking. Your caregiver can help you with smoking cessation.  Eat a diet low in saturated fat. A registered dietician can educate you on healthy food  options such as helping you understand the difference between good fat and bad fat.  Following an exercise program approved by your caregiver.  Maintaining a healthy weight. Lose weight as approved by your caregiver.  Have your cholesterol levels checked as directed by your caregiver.  Medicines. Cholesterol medicines can help slow or stop the progression of atherosclerosis.  Different procedural or surgical interventions to treat atherosclerosis include:  Balloon angioplasty. The technical name for balloon angioplasty is called percutaneous transluminal angioplasty(PTA). In this procedure, a catheter with a small balloon at the tip is inserted through the blocked or narrowed artery. The balloon is then inflated. When the balloon is inflated, the fatty plaque is compressed against the artery wall, allowing better blood flow within the artery.  Balloon angioplasty and stenting. In this procedure, balloon angioplasty is combined with a stenting procedure. A stent is a small, metal mesh tube that keeps the artery open. After the artery is opened up by the balloon technique, the stent is then deployed. The stent is permanent.  Open heart surgery or bypass surgery. To perform this type of surgery, a healthy vessel is first "harvested" from either the leg or arm. The harvested vessel is then used to "bypass" the blocked atherosclerotic vessel so new blood flow can be established.  Atherectomy.  Atherectomy is a procedure that uses a catheter with a sharp blade to remove plaque from an artery. A chamber in the catheter collects the plaque.  Endarterectomy. An endarterectomy is a surgical procedure where a surgeon removes plaque from an artery.  Amputation. When blockages in the lower legs are very severe and circulation cannot be restored, amputation may be required. SEEK IMMEDIATE MEDICAL CARE IF:  You are having heart attack symptoms, such as:  Chest pain or pain that radiates to the neck, arm, jaw, or in the upper, middle back (mid-scapular pain).  Shortness of breath without cause.  Profuse sweating while at rest.  Irregular heartbeats.  Nausea or gastrointestinal upset.  You are having stroke symptoms, such as sudden:  Numbness or weakness to one side of your body, such as the face, arm, or leg.  Confusion or trouble speaking or understanding.  Trouble seeing out of one or both eyes.  Trouble walking, loss of balance, or dizziness.  Severe headache with no known cause.  Your hands or feet are bluish, cold, or you have pain in them.  You have bad abdominal pain after eating. Seek help immediately if you have heart attack or stroke symptoms. Do not drive yourself to the hospital. Call your local emergency service immediately! Do not wait to see if these symptoms go away: Document Released: 09/23/2003 Document Revised: 01/02/2012 Document Reviewed: 09/05/2011 [facility] Patient Information 2014 [place].

## 2013-11-03 NOTE — Telephone Encounter (Signed)
Rx sent to the pharmacy by e-script.  Pt needs office visit.//AB/CMA 

## 2013-11-03 NOTE — Progress Notes (Signed)
ANTICOAGULATION CONSULT NOTE - Follow Up Consult  Pharmacy Consult for Heparin, Warfarin Indication: acute splenic and right renal infarcts   Allergies  Allergen Reactions  . Sulfonamide Derivatives     Stomach cramps  . Neomycin-Bacitracin Zn-Polymyx Rash    Patient Measurements: Height: 5\' 6"  (167.6 cm) Weight: 246 lb 4.1 oz (111.7 kg) IBW/kg (Calculated) : 59.3 Heparin Dosing Weight: 85 kg  Vital Signs: Temp: 97.7 F (36.5 C) (04/20 0450) Temp src: Oral (04/20 0450) BP: 143/64 mmHg (04/20 0450) Pulse Rate: 58 (04/20 0450)  Labs:  Recent Labs  11/01/13 0357 11/02/13 0529 11/03/13 0415  HGB 14.0 13.5 14.2  HCT 43.3 41.6 44.5  PLT 278 294 319  LABPROT 13.3 19.0* 28.1*  INR 1.03 1.64* 2.74*  HEPARINUNFRC 0.50 0.40 0.80*  CREATININE 0.46* 0.45* 0.50    Estimated Creatinine Clearance: 81.8 ml/min (by C-G formula based on Cr of 0.5).   Medications:  Infusions:  . heparin 2,400 Units/hr (11/03/13 0340)    Assessment: 88 yoF [facility] with abdominal pain and nausea.  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  No PMH of clotting disorder or anticoagulation, but daughter has lupus anticoagulant and factor V leiden with hx thrombus.  Patient negative for Factor 5 leiden and Lupus anticoagulant.  Pharmacy consulted to dose IV heparin and warfarin.  4/20: D4 Heparin/Coumadin bridge  Heparin level has risen to 0.80, now supratherapeutic at 2400 units/hr  Stable CBC  No reported bleeding  INR also increased to 2.74 after 3 doses of warfarin 4/17-4/19: 7.88m, 7.5mg , 4mg   Goal of Therapy:  INR 2-3 Heparin level 0.3-0.7 units/ml Monitor platelets by anticoagulation protocol: Yes   Plan:  Reduce IV heparin 2200 units/hr Hold warfarin today Recheck heparin level in 6 hours after rate change (@ 1400) Daily INR and heparin level Warfarin education completed 4/18.  Ralene Bathe, PharmD,  BCPS 11/03/2013, 7:09 AM  Pager: [phone]

## 2013-11-03 NOTE — Progress Notes (Signed)
Inpatient Diabetes Program Recommendations  AACE/ADA: New Consensus Statement on Inpatient Glycemic Control (2013)  Target Ranges:  Prepandial:   less than 140 mg/dL      Peak postprandial:   less than 180 mg/dL (1-2 hours)      Critically ill patients:  140 - 180 mg/dL   Reason for Visit: DM2  Results for LYGIA, OLAES (MRN [id number]) as of 11/03/2013 10:11  Ref. Range 10/31/2013 17:19 11/01/2013 07:06 11/01/2013 12:15 11/01/2013 16:44 11/02/2013 07:44 11/02/2013 12:15 11/02/2013 16:57 11/03/2013 07:17  Glucose-Capillary Latest Range: 70-99 mg/dL 145 (H) 108 (H) 151 (H) 100 (H) 155 (H) 80 179 (H) 92      Inpatient Diabetes Program Recommendations Diet: Please add CHO mod med to diet.  Note: Will continue to follow.   Thank you. Lorenda Peck, RD, LDN, CDE Inpatient Diabetes Coordinator [phone]

## 2013-11-04 ENCOUNTER — Encounter: Payer: Self-pay | Admitting: *Deleted

## 2013-11-04 ENCOUNTER — Other Ambulatory Visit: Payer: Self-pay | Admitting: Family Medicine

## 2013-11-04 ENCOUNTER — Ambulatory Visit (INDEPENDENT_AMBULATORY_CARE_PROVIDER_SITE_OTHER): Payer: Medicare Other | Admitting: Cardiology

## 2013-11-04 ENCOUNTER — Encounter (INDEPENDENT_AMBULATORY_CARE_PROVIDER_SITE_OTHER): Payer: 59

## 2013-11-04 DIAGNOSIS — D7389 Other diseases of spleen: Secondary | ICD-10-CM

## 2013-11-04 DIAGNOSIS — I480 Paroxysmal atrial fibrillation: Secondary | ICD-10-CM

## 2013-11-04 DIAGNOSIS — I4949 Other premature depolarization: Secondary | ICD-10-CM

## 2013-11-04 DIAGNOSIS — D735 Infarction of spleen: Secondary | ICD-10-CM

## 2013-11-04 DIAGNOSIS — I4891 Unspecified atrial fibrillation: Secondary | ICD-10-CM

## 2013-11-04 DIAGNOSIS — N2889 Other specified disorders of kidney and ureter: Secondary | ICD-10-CM

## 2013-11-04 DIAGNOSIS — N280 Ischemia and infarction of kidney: Secondary | ICD-10-CM

## 2013-11-04 LAB — CULTURE, BLOOD (ROUTINE X 2)
CULTURE: NO GROWTH
Culture: NO GROWTH

## 2013-11-04 LAB — GLUCOSE, CAPILLARY: Glucose-Capillary: 191 mg/dL — ABNORMAL HIGH (ref 70–99)

## 2013-11-04 LAB — POCT INR: INR: 3.29

## 2013-11-04 NOTE — Progress Notes (Signed)
Patient ID: Heidi Stark, female   DOB: Nov 30, 1940, 73 y.o.   MRN: [id number] [employer] 30 day cardiac event monitor applied to patient.

## 2013-11-07 ENCOUNTER — Ambulatory Visit (INDEPENDENT_AMBULATORY_CARE_PROVIDER_SITE_OTHER): Payer: Medicare Other | Admitting: Internal Medicine

## 2013-11-07 DIAGNOSIS — N2889 Other specified disorders of kidney and ureter: Secondary | ICD-10-CM

## 2013-11-07 DIAGNOSIS — N280 Ischemia and infarction of kidney: Secondary | ICD-10-CM

## 2013-11-07 DIAGNOSIS — D735 Infarction of spleen: Secondary | ICD-10-CM

## 2013-11-07 DIAGNOSIS — D7389 Other diseases of spleen: Secondary | ICD-10-CM

## 2013-11-07 DIAGNOSIS — I480 Paroxysmal atrial fibrillation: Secondary | ICD-10-CM

## 2013-11-07 DIAGNOSIS — I4891 Unspecified atrial fibrillation: Secondary | ICD-10-CM

## 2013-11-07 LAB — POCT INR: INR: 1.9

## 2013-11-10 ENCOUNTER — Telehealth: Payer: Self-pay | Admitting: Family Medicine

## 2013-11-10 DIAGNOSIS — B3731 Acute candidiasis of vulva and vagina: Secondary | ICD-10-CM

## 2013-11-10 DIAGNOSIS — B373 Candidiasis of vulva and vagina: Secondary | ICD-10-CM

## 2013-11-10 MED ORDER — FLUCONAZOLE 150 MG PO TABS: 150.0000 mg | ORAL_TABLET | Freq: Once | ORAL | Status: DC

## 2013-11-10 NOTE — Telephone Encounter (Signed)
Spoke with patient who advised that she has developed a yeast infection since being d/c'd from hospital. Do you want to prescribe diflucan for her? Follow up appt made for 11/12/13 at 11:30.

## 2013-11-10 NOTE — Telephone Encounter (Signed)
Caller name: Trina Asch Relation to pt: Call back number:[phone] Pharmacy:  Reason for call: pt states that they were at the hospital and it has become apparent that she has a yeast infection.  Pt would like th RN to call her back.  Pt refused to give anymore information than that.

## 2013-11-10 NOTE — Telephone Encounter (Signed)
Vaginal yeast infection or skin?

## 2013-11-10 NOTE — Telephone Encounter (Signed)
Vaginal yeast infection

## 2013-11-10 NOTE — Telephone Encounter (Signed)
Diflucan 150 mg #2  1 po qd x 1, may repeat in 3 days prn  

## 2013-11-10 NOTE — Telephone Encounter (Signed)
Rx for Diflucan sent to [facility] on [address]. Pt aware

## 2013-11-12 ENCOUNTER — Ambulatory Visit (INDEPENDENT_AMBULATORY_CARE_PROVIDER_SITE_OTHER): Payer: 59 | Admitting: Family Medicine

## 2013-11-12 ENCOUNTER — Ambulatory Visit (INDEPENDENT_AMBULATORY_CARE_PROVIDER_SITE_OTHER): Payer: Medicare Other | Admitting: *Deleted

## 2013-11-12 ENCOUNTER — Encounter: Payer: Self-pay | Admitting: Family Medicine

## 2013-11-12 ENCOUNTER — Telehealth: Payer: Self-pay | Admitting: Family Medicine

## 2013-11-12 VITALS — BP 120/72 | HR 56 | Temp 98.1°F | Wt 240.0 lb

## 2013-11-12 DIAGNOSIS — D7389 Other diseases of spleen: Secondary | ICD-10-CM

## 2013-11-12 DIAGNOSIS — D735 Infarction of spleen: Secondary | ICD-10-CM

## 2013-11-12 DIAGNOSIS — N280 Ischemia and infarction of kidney: Secondary | ICD-10-CM

## 2013-11-12 DIAGNOSIS — I4891 Unspecified atrial fibrillation: Secondary | ICD-10-CM

## 2013-11-12 DIAGNOSIS — I10 Essential (primary) hypertension: Secondary | ICD-10-CM

## 2013-11-12 DIAGNOSIS — E785 Hyperlipidemia, unspecified: Secondary | ICD-10-CM

## 2013-11-12 DIAGNOSIS — N2889 Other specified disorders of kidney and ureter: Secondary | ICD-10-CM

## 2013-11-12 DIAGNOSIS — I480 Paroxysmal atrial fibrillation: Secondary | ICD-10-CM

## 2013-11-12 LAB — POCT INR: INR: 2

## 2013-11-12 NOTE — Patient Instructions (Signed)
Atrial Fibrillation  Atrial fibrillation is a type of irregular heart rhythm (arrhythmia). During atrial fibrillation, the upper chambers of the heart (atria) quiver continuously in a chaotic pattern. This causes an irregular and often rapid heart rate.   Atrial fibrillation is the result of the heart becoming overloaded with disorganized signals that tell it to beat. These signals are normally released one at a time by a part of the right atrium called the sinoatrial node. They then travel from the atria to the lower chambers of the heart (ventricles), causing the atria and ventricles to contract and pump blood as they pass. In atrial fibrillation, parts of the atria outside of the sinoatrial node also release these signals. This results in two problems. First, the atria receive so many signals that they do not have time to fully contract. Second, the ventricles, which can only receive one signal at a time, beat irregularly and out of rhythm with the atria.   There are three types of atrial fibrillation:    Paroxysmal Paroxysmal atrial fibrillation starts suddenly and stops on its own within a week.    Persistent Persistent atrial fibrillation lasts for more than a week. It may stop on its own or with treatment.    Permanent Permanent atrial fibrillation does not go away. Episodes of atrial fibrillation may lead to permanent atrial fibrillation.   Atrial fibrillation can prevent your heart from pumping blood normally. It increases your risk of stroke and can lead to heart failure.   CAUSES    Heart conditions, including a heart attack, heart failure, coronary artery disease, and heart valve conditions.    Inflammation of the sac that surrounds the heart (pericarditis).    Blockage of an artery in the lungs (pulmonary embolism).    Pneumonia or other infections.    Chronic lung disease.    Thyroid problems, especially if the thyroid is overactive (hyperthyroidism).    Caffeine, excessive alcohol  use, and use of some illegal drugs.    Use of some medications, including certain decongestants and diet pills.    Heart surgery.    Birth defects.   Sometimes, no cause can be found. When this happens, the atrial fibrillation is called lone atrial fibrillation. The risk of complications from atrial fibrillation increases if you have lone atrial fibrillation and you are age 60 years or older.  RISK FACTORS   Heart failure.   Coronary artery disease   Diabetes mellitus.    High blood pressure (hypertension).    Obesity.    Other arrhythmias.    Increased age.  SYMPTOMS    A feeling that your heart is beating rapidly or irregularly.    A feeling of discomfort or pain in your chest.    Shortness of breath.    Sudden lightheadedness or weakness.    Getting tired easily when exercising.    Urinating more often than normal (mainly when atrial fibrillation first begins).   In paroxysmal atrial fibrillation, symptoms may start and suddenly stop.  DIAGNOSIS   Your caregiver may be able to detect atrial fibrillation when taking your pulse. Usually, testing is needed to diagnosis atrial fibrillation. Tests may include:    Electrocardiography. During this test, the electrical impulses of your heart are recorded while you are lying down.    Echocardiography. During echocardiography, sound waves are used to evaluate how blood flows through your heart.    Stress test. There is more than one type of stress test. If a stress test is   needed, ask your caregiver about which type is best for you.    Chest X-ray exam.    Blood tests.    Computed tomography (CT).   TREATMENT    Treating any underlying conditions. For example, if you have an overactive thyroid, treating the condition may correct atrial fibrillation.    Medication. Medications may be given to control a rapid heart rate or to prevent blood clots, heart failure, or a stroke.    Procedure to correct the rhythm of the  heart:   Electrical cardioversion. During electrical cardioversion, a controlled, low-energy shock is delivered to the heart through your skin. If you have chest pain, very low pressure blood pressure, or sudden heart failure, this procedure may need to be done as an emergency.   Catheter ablation. During this procedure, heart tissues that send the signals that cause atrial fibrillation are destroyed.   Maze or minimaze procedure. During this surgery, thin lines of heart tissue that carry the abnormal signals are destroyed. The maze procedure is an open-heart surgery. The minimaze procedure is a minimally invasive surgery. This means that small cuts are made to access the heart instead of a large opening.   Pulmonary venous isolation. During this surgery, tissue around the veins that carry blood from the lungs (pulmonary veins) is destroyed. This tissue is thought to carry the abnormal signals.  HOME CARE INSTRUCTIONS    Take medications as directed by your caregiver.   Only take medications that your caregiver approves. Some medications can make atrial fibrillation worse or recur.   If blood thinners were prescribed by your caregiver, take them exactly as directed. Too much can cause bleeding. Too little and you will not have the needed protection against stroke and other problems.   Perform blood tests at home if directed by your caregiver.   Perform blood tests exactly as directed.    Quit smoking if you smoke.    Do not drink alcohol.    Do not drink caffeinated beverages such as coffee, soda, and some teas. You may drink decaffeinated coffee, soda, or tea.    Maintain a healthy weight. Do not use diet pills unless your caregiver approves. They may make heart problems worse.    Follow diet instructions as directed by your caregiver.    Exercise regularly as directed by your caregiver.    Keep all follow-up appointments.  PREVENTION   The following substances can cause atrial fibrillation  to recur:    Caffeinated beverages.    Alcohol.    Certain medications, especially those used for breathing problems.    Certain herbs and herbal medications, such as those containing ephedra or ginseng.   Illegal drugs such as cocaine and amphetamines.  Sometimes medications are given to prevent atrial fibrillation from recurring. Proper treatment of any underlying condition is also important in helping prevent recurrence.   SEEK MEDICAL CARE IF:   You notice a change in the rate, rhythm, or strength of your heartbeat.    You suddenly begin urinating more frequently.    You tire more easily when exerting yourself or exercising.   SEEK IMMEDIATE MEDICAL CARE IF:    You develop chest pain, abdominal pain, sweating, or weakness.   You feel sick to your stomach (nauseous).   You develop shortness of breath.   You suddenly develop swollen feet and ankles.   You feel dizzy.   You face or limbs feel numb or weak.   There is a change in your   vision or speech.  MAKE SURE YOU:    Understand these instructions.   Will watch your condition.   Will get help right away if you are not doing well or get worse.  Document Released: 07/03/2005 Document Revised: 10/28/2012 Document Reviewed: 08/13/2012  [employer] Patient Information 2014 [employer].

## 2013-11-12 NOTE — Progress Notes (Signed)
   Subjective:    Patient ID: Heidi Stark, female    DOB: 1941/05/08, 73 y.o.   MRN: [id number]  HPI Pt here f/u from hospital  For splenic / renal infarct and a fib.  Pt has been f/u cardiology for coumadin.  Pt is feeling much better.  No new complaints   Review of Systems Review of Systems  Constitutional: Negative for activity change, appetite change and fatigue.  HENT: Negative for hearing loss, congestion, tinnitus and ear discharge.  dentist q40m Eyes: Negative for visual disturbance (see optho q1y -- vision corrected to 20/20 with glasses).  Respiratory: Negative for cough, chest tightness and shortness of breath.   Cardiovascular: Negative for chest pain, palpitations and leg swelling.  Gastrointestinal: Negative for abdominal pain, diarrhea, constipation and abdominal distention.  Genitourinary: Negative for urgency, frequency, decreased urine volume and difficulty urinating.  Musculoskeletal: Negative for back pain, arthralgias and gait problem.  Skin: Negative for color change, pallor and rash.  Neurological: Negative for dizziness, light-headedness, numbness and headaches.  Hematological: Negative for adenopathy. Does not bruise/bleed easily.  Psychiatric/Behavioral: Negative for suicidal ideas, confusion, sleep disturbance, self-injury, dysphoric mood, decreased concentration and agitation.     Past Medical History  Diagnosis Date  . COPD (chronic obstructive pulmonary disease)   . Hypertension   . Hyperlipidemia   . Diabetes mellitus     Type II   History   Social History  . Marital Status: Married    Spouse Name: N/A    Number of Children: N/A  . Years of Education: N/A   Occupational History  . Not on file.   Social History Main Topics  . Smoking status: Former Smoker    Quit date: 09/14/2005  . Smokeless tobacco: Never Used  . Alcohol Use: No  . Drug Use: No  . Sexual Activity: Not on file   Other Topics Concern  . Not on file   Social History  Narrative  . No narrative on file   Family History  Problem Relation Age of Onset  . Diabetes    . Melanoma    . Factor V Leiden deficiency Daughter         Objective:   Physical Exam BP 120/72  Pulse 56  Temp(Src) 98.1 F (36.7 C) (Oral)  Wt 240 lb (108.863 kg)  SpO2 95% General appearance: alert, cooperative, appears stated age and no distress Ears: normal TM's and external ear canals both ears Nose: Nares normal. Septum midline. Mucosa normal. No drainage or sinus tenderness. Throat: lips, mucosa, and tongue normal; teeth and gums normal Neck: no adenopathy, no carotid bruit, no JVD, supple, symmetrical, trachea midline and thyroid not enlarged, symmetric, no tenderness/mass/nodules Lungs: clear to auscultation bilaterally Heart: S1, S2 normal Extremities: extremities normal, atraumatic, no cyanosis or edema        Assessment & Plan:  1. 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 Renal infarct On coumadin   4. PAF (paroxysmal atrial fibrillation) On coumadin F/u cardiology  5. Other and unspecified hyperlipidemia Check labs  con't meds - Hepatic function panel; Future - Lipid panel; Future

## 2013-11-12 NOTE — Telephone Encounter (Signed)
Relevant patient education assigned to patient using Emmi. ° °

## 2013-11-12 NOTE — Progress Notes (Signed)
Pre visit review using our clinic review tool, if applicable. No additional management support is needed unless otherwise documented below in the visit note. 

## 2013-11-12 NOTE — Assessment & Plan Note (Signed)
Per cardiology  on coumadin 

## 2013-11-12 NOTE — Assessment & Plan Note (Signed)
On coumadin per card

## 2013-11-12 NOTE — Assessment & Plan Note (Signed)
Per cardiology On coumadin  

## 2013-11-13 ENCOUNTER — Other Ambulatory Visit (INDEPENDENT_AMBULATORY_CARE_PROVIDER_SITE_OTHER): Payer: 59

## 2013-11-13 DIAGNOSIS — E785 Hyperlipidemia, unspecified: Secondary | ICD-10-CM

## 2013-11-13 DIAGNOSIS — I10 Essential (primary) hypertension: Secondary | ICD-10-CM

## 2013-11-13 LAB — BASIC METABOLIC PANEL
BUN: 21 mg/dL (ref 6–23)
CO2: 31 mEq/L (ref 19–32)
Calcium: 8.5 mg/dL (ref 8.4–10.5)
Chloride: 99 mEq/L (ref 96–112)
Creatinine, Ser: 0.7 mg/dL (ref 0.4–1.2)
GFR: 82.03 mL/min (ref >60.00)
Glucose, Bld: 139 mg/dL — ABNORMAL HIGH (ref 70–99)
Potassium: 4.1 mEq/L (ref 3.5–5.1)
SODIUM: 137 meq/L (ref 135–145)

## 2013-11-13 LAB — HEPATIC FUNCTION PANEL
ALBUMIN: 3.5 g/dL (ref 3.5–5.2)
ALK PHOS: 41 U/L (ref 39–117)
ALT: 28 U/L (ref 0–35)
AST: 33 U/L (ref 0–37)
Bilirubin, Direct: 0 mg/dL (ref 0.0–0.3)
Total Bilirubin: 0.4 mg/dL (ref 0.3–1.2)
Total Protein: 6.9 g/dL (ref 6.0–8.3)

## 2013-11-13 LAB — CBC WITH DIFFERENTIAL/PLATELET
BASOS ABS: 0.1 10*3/uL (ref 0.0–0.1)
Basophils Relative: 0.6 % (ref 0.0–3.0)
Eosinophils Absolute: 0.1 10*3/uL (ref 0.0–0.7)
Eosinophils Relative: 1.2 % (ref 0.0–5.0)
HCT: 46.2 % — ABNORMAL HIGH (ref 36.0–46.0)
HEMOGLOBIN: 14.8 g/dL (ref 12.0–15.0)
Lymphocytes Relative: 17.9 % (ref 12.0–46.0)
Lymphs Abs: 1.9 10*3/uL (ref 0.7–4.0)
MCHC: 31.9 g/dL (ref 30.0–36.0)
MCV: 88.9 fl (ref 78.0–100.0)
MONOS PCT: 10.4 % (ref 3.0–12.0)
Monocytes Absolute: 1.1 10*3/uL — ABNORMAL HIGH (ref 0.1–1.0)
NEUTROS ABS: 7.5 10*3/uL (ref 1.4–7.7)
Neutrophils Relative %: 69.9 % (ref 43.0–77.0)
Platelets: 304 10*3/uL (ref 150.0–400.0)
RBC: 5.2 Mil/uL — ABNORMAL HIGH (ref 3.87–5.11)
RDW: 16 % — ABNORMAL HIGH (ref 11.5–14.6)
WBC: 10.8 10*3/uL — ABNORMAL HIGH (ref 4.5–10.5)

## 2013-11-17 ENCOUNTER — Telehealth: Payer: Self-pay | Admitting: Family Medicine

## 2013-11-17 NOTE — Telephone Encounter (Signed)
To MD for review     KP 

## 2013-11-17 NOTE — Telephone Encounter (Signed)
ok 

## 2013-11-17 NOTE — Telephone Encounter (Signed)
Caller name:Giabella Seelig Relation to NA:TFTDDUK Call back number:[phone] Pharmacy:  Reason for call: Patient called and stated that she would like to start getting her coumadin checked here instead of by [facility] nurses. Patient states that she has not been pleased with there services. Please advise.

## 2013-11-18 ENCOUNTER — Ambulatory Visit: Payer: Medicare Other

## 2013-11-18 NOTE — Telephone Encounter (Signed)
Please schedule the patient.     KP

## 2013-11-19 ENCOUNTER — Ambulatory Visit (INDEPENDENT_AMBULATORY_CARE_PROVIDER_SITE_OTHER): Payer: 59 | Admitting: *Deleted

## 2013-11-19 VITALS — BP 135/80 | HR 68 | Temp 98.4°F | Wt 245.0 lb

## 2013-11-19 DIAGNOSIS — D735 Infarction of spleen: Secondary | ICD-10-CM

## 2013-11-19 DIAGNOSIS — D7389 Other diseases of spleen: Secondary | ICD-10-CM

## 2013-11-19 DIAGNOSIS — N2889 Other specified disorders of kidney and ureter: Secondary | ICD-10-CM

## 2013-11-19 DIAGNOSIS — Z7901 Long term (current) use of anticoagulants: Secondary | ICD-10-CM

## 2013-11-19 DIAGNOSIS — N280 Ischemia and infarction of kidney: Secondary | ICD-10-CM

## 2013-11-19 DIAGNOSIS — I4891 Unspecified atrial fibrillation: Secondary | ICD-10-CM

## 2013-11-19 DIAGNOSIS — I480 Paroxysmal atrial fibrillation: Secondary | ICD-10-CM

## 2013-11-19 LAB — POCT INR
INR: 2.5
INR: 2.5

## 2013-11-19 NOTE — Patient Instructions (Signed)
Continue with 2.5 mg daily and return in 4 weeks recheck on PT.

## 2013-11-19 NOTE — Progress Notes (Signed)
Pre-visit discussion using our clinic review tool. No additional management support is needed unless otherwise documented below in the visit note.  

## 2013-11-21 ENCOUNTER — Encounter: Payer: Self-pay | Admitting: Cardiology

## 2013-11-21 ENCOUNTER — Ambulatory Visit (INDEPENDENT_AMBULATORY_CARE_PROVIDER_SITE_OTHER): Payer: 59 | Admitting: Cardiology

## 2013-11-21 VITALS — BP 130/65 | HR 60 | Ht 69.0 in | Wt 245.0 lb

## 2013-11-21 DIAGNOSIS — I480 Paroxysmal atrial fibrillation: Secondary | ICD-10-CM

## 2013-11-21 DIAGNOSIS — I4891 Unspecified atrial fibrillation: Secondary | ICD-10-CM

## 2013-11-21 DIAGNOSIS — I739 Peripheral vascular disease, unspecified: Secondary | ICD-10-CM

## 2013-11-21 DIAGNOSIS — E785 Hyperlipidemia, unspecified: Secondary | ICD-10-CM

## 2013-11-21 NOTE — Patient Instructions (Signed)
Your physician recommends that you continue on your current medications as directed. Please refer to the Current Medication list given to you today.  Your physician has requested that you have an ankle brachial index (ABI). During this test an ultrasound and blood pressure cuff are used to evaluate the arteries that supply the arms and legs with blood. Allow thirty minutes for this exam. There are no restrictions or special instructions.  Your physician recommends that you schedule a follow-up appointment in: 3 months with Dr [name]

## 2013-11-23 NOTE — Progress Notes (Deleted)
Patient ID: Heidi Stark, female   DOB: [birth year], 73 y.o.   MRN: [id number] PCP: Dr. Etter Stark  73 yo with history of HTN, DM, hyperlipidemia presents for outpatient followup after recent admission with splenic and renal infarcts.  She was found to have paroxysmal atrial fibrillation.  Patient was admitted in 4/15 with fever and LUQ pain.  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  TEE showed prominent aortic plaque.  While she was in the hospital, she was noted to have a brief run of atrial fibrillation and was started on coumadin.    She has been stable since getting home.  She has not felt any tachypalpitations.  She is wearing an event monitor to assess atrial fibrillation burden.  Mild fatigue in general.  No abdominal pain.  No dyspnea walking on flat ground, mild dyspnea with steps.  No chest pain.  She has left leg pain with ambulation after about 100 feet.  This has been present ever since she had left iliac damage with a prior back surgery.    ECG: NSR, iRBBB, inferior T wave inversions  Labs (4/15): K 4.1, creatinine 0.7, HCT 46.2  PMH: 1. Type II diabetes 2. HTN 3. Hyperlipidemia 4. COPD 5. LHC (2007) reportedly normal.  6. Paroxysmal atrial fibrillation: First noted in 4/15.  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  TEE (4/15) with EF 50-55%, mild LVH, grade III-IV plaque in descending thoracic aorta, RV normal, no PFO, peak RV-RA gradient 42 mmHg.  7. H/o diskectomy. 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   SH: Married, 2 kids, quit smoking in 2015.  FH: Heidi Stark with factor V Leiden deficiency.    ROS: All systems reviewed and negative except as per HPI.   Current Outpatient Prescriptions  Medication Sig Dispense Refill  . albuterol (PROVENTIL) (2.5 MG/3ML) 0.083% nebulizer solution Take 3 mLs (2.5 mg total) by nebulization 4 (four) times daily as needed for wheezing.  75 mL   2  . fenofibrate 160 MG tablet TAKE 1 TABLET BY MOUTH EVERY DAY ***PATIENT DUE FOR LABS***  30 tablet  0  . Fluticasone-Salmeterol (ADVAIR) 250-50 MCG/DOSE AEPB Inhale 1 puff into the lungs every 12 (twelve) hours. As needed for shortness of breath      . furosemide (LASIX) 20 MG tablet Take 20 mg by mouth 2 (two) times daily.      Marland Kitchen glimepiride (AMARYL) 2 MG tablet Take 2 mg by mouth daily with supper.       Marland Kitchen glucose blood test strip Check Blood Sugars four time daily  100 each  2  . HYDROcodone-acetaminophen (NORCO/VICODIN) 5-325 MG per tablet Take 1-2 tablets by mouth every 6 (six) hours as needed for moderate pain or severe pain.  65 tablet  0  . INVOKANA 100 MG TABS TAKE 1 TABLET BY MOUTH EVERY DAY ***PATIENT DUE FOR LABS***  30 tablet  0  . KAZANO 12.11-998 MG TABS TAKE 1 TABLET BY MOUTH TWICE A DAY ***PATIENT DUE FOR LABS***  60 tablet  0  . lisinopril (PRINIVIL,ZESTRIL) 5 MG tablet Take 5 mg by mouth daily.      . metoprolol tartrate (LOPRESSOR) 25 MG tablet TAKE 1/2 TABLET BY MOUTH TWICE A DAY  30 tablet  0  . potassium chloride (K-DUR) 10 MEQ tablet Take 10 mEq by mouth 2 (two) times daily.      . predniSONE (DELTASONE) 10 MG  Take 50 mg tablet today and taper down by 10 mg daily until completed  15 tablet  0  . rosuvastatin (CRESTOR) 20 MG tablet Take 20 mg by mouth daily.      . warfarin (COUMADIN) 2.5 MG tablet Take 1 tablet (2.5 mg total) by mouth daily. Start taking tomorrow 11/04/2013  30 tablet  0   No current facility-administered medications for this visit.    BP 130/65  Pulse 60  Ht 5' 9" (1.753 m)  Wt 111.131 kg (245 lb)  BMI 36.16 kg/m2 General: NAD Neck: No JVD, no thyromegaly or thyroid nodule.  Lungs: Clear to auscultation bilaterally with normal respiratory effort. CV: Nondisplaced PMI.  Heart regular S1/S2, no S3/S4, no murmur.  No peripheral edema.  No carotid bruit.  I am unable to palpate left PT pulse.  Abdomen: Soft, nontender, no hepatosplenomegaly, no distention.   Skin: Intact without lesions or rashes.  Neurologic: Alert and oriented x 3.  Psych: Normal affect. Extremities: No clubbing or cyanosis.  HEENT: Normal.   Assessment/Plan: 1. Atrial fibrillation: Paroxysmal.  1 episode noted in the hospital.  Suspect the splenic and renal infarcts were cardio-embolic.   - She is now wearing a 30 day monitor to assess the atrial fibrillation burden.  She did not feel the atrial fibrillation when she was in it.   - Continue metoprolol. - Continue warfarin for now.  When she has been anticoagulated > 1 month post-event, would consider transition from warfarin to NOAC.  2. Claudication: Left leg claudication with absent left PT pulse.  Suspect this is related to prior damage to the iliac artery on that side.  I will get peripheral arterial dopplers to further assess. 3. Obesity: I counseled her to work on weight loss.  4. Hyperlipidemia: Goal LDL ideally < 70.  Will get lipids at next appointment.   Heidi Stark 11/23/2013 

## 2013-11-23 NOTE — Progress Notes (Signed)
Patient ID: Heidi Stark, female   DOB: 1941-04-12, 73 y.o.   MRN: [id number] PCP: Dr. [name]  73 yo with history of HTN, DM, hyperlipidemia presents for outpatient followup after recent admission with splenic and renal infarcts.  She was found to have paroxysmal atrial fibrillation.  Patient was admitted in 4/15 with fever and LUQ pain.  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  TEE showed prominent aortic plaque.  While she was in the hospital, she was noted to have a brief run of atrial fibrillation and was started on coumadin.    She has been stable since getting home.  She has not felt any tachypalpitations.  She is wearing an event monitor to assess atrial fibrillation burden.  Mild fatigue in general.  No abdominal pain.  No dyspnea walking on flat ground, mild dyspnea with steps.  No chest pain.  She has left leg pain with ambulation after about 100 feet.  This has been present ever since she had left iliac damage with a prior back surgery.    ECG: NSR, iRBBB, inferior T wave inversions  Labs (4/15): K 4.1, creatinine 0.7, HCT 46.2  PMH: 1.Type II diabetes 2. HTN 3. Hyperlipidemia 4. COPD 5. LHC (2007) reportedly normal.  6. Paroxysmal atrial fibrillation: First noted in 4/15.  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  TEE (4/15) with EF 50-55%, mild LVH, grade III-IV plaque in descending thoracic aorta, RV normal, no PFO, peak RV-RA gradient 42 mmHg.  7. H/o diskectomy. 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   SH: Married, 2 kids, quit smoking in 2015.  FH: Daughter with factor V Leiden deficiency.    ROS: All systems reviewed and negative except as per HPI.   Current Outpatient Prescriptions  Medication Sig Dispense Refill  . albuterol (PROVENTIL) (2.5 MG/3ML) 0.083% nebulizer solution Take 3 mLs (2.5 mg total) by nebulization 4 (four) times daily as needed for wheezing.  75 mL   2  . fenofibrate 160 MG tablet TAKE 1 TABLET BY MOUTH EVERY DAY PATIENT DUE FOR LABS  30 tablet  0  . Fluticasone-Salmeterol (ADVAIR) 250-50 MCG/DOSE AEPB Inhale 1 puff into the lungs every 12 (twelve) hours. As needed for shortness of breath      . furosemide (LASIX) 20 MG tablet Take 20 mg by mouth 2 (two) times daily.      Marland Kitchen glimepiride (AMARYL) 2 MG tablet Take 2 mg by mouth daily with supper.       Marland Kitchen glucose blood test strip Check Blood Sugars four time daily  100 each  2  . HYDROcodone-acetaminophen (NORCO/VICODIN) 5-325 MG per tablet Take 1-2 tablets by mouth every 6 (six) hours as needed for moderate pain or severe pain.  65 tablet  0  . INVOKANA 100 MG TABS TAKE 1 TABLET BY MOUTH EVERY DAY PATIENT DUE FOR LABS  30 tablet  0  . KAZANO 12.11-998 MG TABS TAKE 1 TABLET BY MOUTH TWICE A DAY PATIENT DUE FOR LABS  60 tablet  0  . lisinopril (PRINIVIL,ZESTRIL) 5 MG tablet Take 5 mg by mouth daily.      . metoprolol tartrate (LOPRESSOR) 25 MG tablet TAKE 1/2 TABLET BY MOUTH TWICE A DAY  30 tablet  0  . potassium chloride (K-DUR) 10 MEQ tablet Take 10 mEq by mouth 2 (two) times daily.      . predniSONE (DELTASONE) 10 MG tablet  Take 50 mg tablet today and taper down by 10 mg daily until completed  15 tablet  0  . rosuvastatin (CRESTOR) 20 MG tablet Take 20 mg by mouth daily.      Marland Kitchen warfarin (COUMADIN) 2.5 MG tablet Take 1 tablet (2.5 mg total) by mouth daily. Start taking tomorrow 11/04/2013  30 tablet  0   No current facility-administered medications for this visit.    BP 130/65  Pulse 60  Ht 5\' 9"  (1.753 m)  Wt 111.131 kg (245 lb)  BMI 36.16 kg/m2 General: NAD Neck: No JVD, no thyromegaly or thyroid nodule.  Lungs: Clear to auscultation bilaterally with normal respiratory effort. CV: Nondisplaced PMI.  Heart regular S1/S2, no S3/S4, no murmur.  No peripheral edema.  No carotid bruit.  I am unable to palpate left PT pulse.  Abdomen: Soft, nontender, no hepatosplenomegaly, no distention.   Skin: Intact without lesions or rashes.  Neurologic: Alert and oriented x 3.  Psych: Normal affect. Extremities: No clubbing or cyanosis.  HEENT: Normal.   Assessment/Plan: 1. Atrial fibrillation: Paroxysmal.  1 episode noted in the hospital.  Suspect the splenic and renal infarcts were cardio-embolic.   - She is now wearing a 30 day monitor to assess the atrial fibrillation burden.  She did not feel the atrial fibrillation when she was in it.   - Continue metoprolol. - Continue warfarin for now.  When she has been anticoagulated > 1 month post-event, would consider transition from warfarin to NOAC.  2. Claudication: Left leg claudication with absent left PT pulse.  Suspect this is related to prior damage to the iliac artery on that side.  I will get peripheral arterial dopplers to further assess. 3. Obesity: I counseled her to work on weight loss.  4. Hyperlipidemia: Goal LDL ideally < 70.  Will get lipids at next appointment.   Larey Dresser 11/23/2013

## 2013-11-24 ENCOUNTER — Other Ambulatory Visit (HOSPITAL_COMMUNITY): Payer: Self-pay | Admitting: Cardiology

## 2013-11-24 DIAGNOSIS — R0989 Other specified symptoms and signs involving the circulatory and respiratory systems: Secondary | ICD-10-CM

## 2013-11-25 ENCOUNTER — Telehealth: Payer: Self-pay

## 2013-11-25 NOTE — Telephone Encounter (Signed)
Patient called wanting to make an appointment for a nurse visit for this week to have her coumadin check.  Chart was reviewed and it was determined that due to patient's INR being within therapeutic range, she would not have to come in this week, but should returned 12/17/13.  Patient stated understanding and agreed.  No further questions or concerns voiced.

## 2013-11-27 ENCOUNTER — Encounter (HOSPITAL_COMMUNITY): Payer: 59

## 2013-11-29 ENCOUNTER — Other Ambulatory Visit: Payer: Self-pay | Admitting: Family Medicine

## 2013-12-04 ENCOUNTER — Ambulatory Visit (HOSPITAL_COMMUNITY): Payer: Medicare Other | Attending: Cardiology | Admitting: Cardiology

## 2013-12-04 ENCOUNTER — Other Ambulatory Visit: Payer: Self-pay | Admitting: Family Medicine

## 2013-12-04 ENCOUNTER — Other Ambulatory Visit: Payer: Self-pay | Admitting: Internal Medicine

## 2013-12-04 DIAGNOSIS — I70219 Atherosclerosis of native arteries of extremities with intermittent claudication, unspecified extremity: Secondary | ICD-10-CM

## 2013-12-04 DIAGNOSIS — E785 Hyperlipidemia, unspecified: Secondary | ICD-10-CM

## 2013-12-04 DIAGNOSIS — E669 Obesity, unspecified: Secondary | ICD-10-CM | POA: Insufficient documentation

## 2013-12-04 DIAGNOSIS — I480 Paroxysmal atrial fibrillation: Secondary | ICD-10-CM

## 2013-12-04 DIAGNOSIS — R0989 Other specified symptoms and signs involving the circulatory and respiratory systems: Secondary | ICD-10-CM

## 2013-12-04 DIAGNOSIS — I739 Peripheral vascular disease, unspecified: Secondary | ICD-10-CM

## 2013-12-04 NOTE — Progress Notes (Signed)
Lower arterial doppler and duplex complete 

## 2013-12-04 NOTE — Telephone Encounter (Signed)
Will we be filling this since the patient has been approved to come here for her INR's ?

## 2013-12-05 ENCOUNTER — Other Ambulatory Visit: Payer: Self-pay | Admitting: Family Medicine

## 2013-12-05 MED ORDER — ALOGLIPTIN-METFORMIN HCL 12.5-1000 MG PO TABS: ORAL_TABLET | ORAL | Status: DC

## 2013-12-15 ENCOUNTER — Telehealth: Payer: Self-pay | Admitting: *Deleted

## 2013-12-15 NOTE — Telephone Encounter (Signed)
Dr [name] reviewed monitor done 11/04/13-12/05/13  NSR with PACs. No concerning arrhythmia.  Pt notified.

## 2013-12-17 ENCOUNTER — Ambulatory Visit (INDEPENDENT_AMBULATORY_CARE_PROVIDER_SITE_OTHER): Payer: 59 | Admitting: *Deleted

## 2013-12-17 VITALS — BP 136/72 | HR 67 | Temp 98.5°F | Wt 249.0 lb

## 2013-12-17 DIAGNOSIS — Z7901 Long term (current) use of anticoagulants: Secondary | ICD-10-CM

## 2013-12-17 DIAGNOSIS — I4891 Unspecified atrial fibrillation: Secondary | ICD-10-CM

## 2013-12-17 DIAGNOSIS — I480 Paroxysmal atrial fibrillation: Secondary | ICD-10-CM

## 2013-12-17 LAB — POCT INR
INR: 1.3
INR: 1.3

## 2013-12-17 NOTE — Progress Notes (Signed)
Pre-visit discussion using our clinic review tool. No additional management support is needed unless otherwise documented below in the visit note.  

## 2013-12-17 NOTE — Patient Instructions (Signed)
Take 5mg  tonight and  Resume normal schedule of 2.5 mg daily. Recheck INR in 1 week

## 2013-12-24 ENCOUNTER — Ambulatory Visit (INDEPENDENT_AMBULATORY_CARE_PROVIDER_SITE_OTHER): Payer: 59 | Admitting: *Deleted

## 2013-12-24 VITALS — BP 152/76 | HR 68 | Temp 98.5°F | Wt 249.0 lb

## 2013-12-24 DIAGNOSIS — I4891 Unspecified atrial fibrillation: Secondary | ICD-10-CM

## 2013-12-24 DIAGNOSIS — Z7901 Long term (current) use of anticoagulants: Secondary | ICD-10-CM

## 2013-12-24 DIAGNOSIS — I480 Paroxysmal atrial fibrillation: Secondary | ICD-10-CM

## 2013-12-24 LAB — POCT INR
INR: 1.4
INR: 1.4

## 2013-12-24 NOTE — Patient Instructions (Signed)
Take 5mg  on Monday and Wenesday and then resume  2.5 mg every other day and return in 10 days for recheck of INR.

## 2013-12-27 ENCOUNTER — Other Ambulatory Visit: Payer: Self-pay | Admitting: Family Medicine

## 2013-12-31 ENCOUNTER — Other Ambulatory Visit: Payer: Self-pay | Admitting: Family Medicine

## 2014-01-02 ENCOUNTER — Ambulatory Visit (INDEPENDENT_AMBULATORY_CARE_PROVIDER_SITE_OTHER): Payer: Medicare Other

## 2014-01-02 VITALS — BP 126/78 | HR 70 | Temp 98.9°F | Wt 252.0 lb

## 2014-01-02 DIAGNOSIS — I4891 Unspecified atrial fibrillation: Secondary | ICD-10-CM

## 2014-01-02 DIAGNOSIS — D735 Infarction of spleen: Secondary | ICD-10-CM

## 2014-01-02 DIAGNOSIS — D7389 Other diseases of spleen: Secondary | ICD-10-CM

## 2014-01-02 DIAGNOSIS — I480 Paroxysmal atrial fibrillation: Secondary | ICD-10-CM

## 2014-01-02 DIAGNOSIS — N280 Ischemia and infarction of kidney: Secondary | ICD-10-CM

## 2014-01-02 DIAGNOSIS — N2889 Other specified disorders of kidney and ureter: Secondary | ICD-10-CM

## 2014-01-02 DIAGNOSIS — Z7901 Long term (current) use of anticoagulants: Secondary | ICD-10-CM

## 2014-01-02 LAB — POCT INR
INR: 1.4
INR: 1.7
INR: 1.7

## 2014-01-02 NOTE — Patient Instructions (Addendum)
Your INR was 1.7 today which is below your goal. Dr.Lowne advises for you to take 5 mg (2 tablets) coumadin on Mon, Wed and Fri and 2.5 mg(1 tablet) on all other days. We will repeat your INR levels in 2 weeks. If you have any question or concerns please call the office at [phone].      KP

## 2014-01-15 ENCOUNTER — Ambulatory Visit (INDEPENDENT_AMBULATORY_CARE_PROVIDER_SITE_OTHER): Payer: Medicare Other | Admitting: *Deleted

## 2014-01-15 ENCOUNTER — Ambulatory Visit (INDEPENDENT_AMBULATORY_CARE_PROVIDER_SITE_OTHER): Payer: Medicare Other | Admitting: Family Medicine

## 2014-01-15 ENCOUNTER — Encounter: Payer: Self-pay | Admitting: Family Medicine

## 2014-01-15 ENCOUNTER — Ambulatory Visit: Payer: Medicare Other

## 2014-01-15 VITALS — HR 71 | Temp 97.7°F | Wt 248.0 lb

## 2014-01-15 VITALS — BP 142/90 | HR 73 | Temp 98.3°F | Wt 249.0 lb

## 2014-01-15 DIAGNOSIS — I4891 Unspecified atrial fibrillation: Secondary | ICD-10-CM

## 2014-01-15 DIAGNOSIS — E1165 Type 2 diabetes mellitus with hyperglycemia: Secondary | ICD-10-CM

## 2014-01-15 DIAGNOSIS — IMO0002 Reserved for concepts with insufficient information to code with codable children: Secondary | ICD-10-CM

## 2014-01-15 DIAGNOSIS — R21 Rash and other nonspecific skin eruption: Secondary | ICD-10-CM

## 2014-01-15 DIAGNOSIS — Z7901 Long term (current) use of anticoagulants: Secondary | ICD-10-CM

## 2014-01-15 DIAGNOSIS — I480 Paroxysmal atrial fibrillation: Secondary | ICD-10-CM

## 2014-01-15 DIAGNOSIS — E118 Type 2 diabetes mellitus with unspecified complications: Secondary | ICD-10-CM

## 2014-01-15 LAB — POCT INR
INR: 2
INR: 2

## 2014-01-15 LAB — BASIC METABOLIC PANEL
BUN: 25 mg/dL — ABNORMAL HIGH (ref 6–23)
CALCIUM: 9.1 mg/dL (ref 8.4–10.5)
CO2: 27 meq/L (ref 19–32)
Chloride: 100 mEq/L (ref 96–112)
Creatinine, Ser: 0.8 mg/dL (ref 0.4–1.2)
GFR: 77.16 mL/min (ref >60.00)
Glucose, Bld: 304 mg/dL — ABNORMAL HIGH (ref 70–99)
POTASSIUM: 3.9 meq/L (ref 3.5–5.1)
SODIUM: 138 meq/L (ref 135–145)

## 2014-01-15 LAB — CBC WITH DIFFERENTIAL/PLATELET
BASOS PCT: 0.7 % (ref 0.0–3.0)
Basophils Absolute: 0.1 10*3/uL (ref 0.0–0.1)
EOS ABS: 0.4 10*3/uL (ref 0.0–0.7)
Eosinophils Relative: 3.3 % (ref 0.0–5.0)
HCT: 50 % — ABNORMAL HIGH (ref 36.0–46.0)
HEMOGLOBIN: 16.1 g/dL — AB (ref 12.0–15.0)
LYMPHS ABS: 3 10*3/uL (ref 0.7–4.0)
LYMPHS PCT: 22.4 % (ref 12.0–46.0)
MCHC: 32.1 g/dL (ref 30.0–36.0)
MCV: 88 fl (ref 78.0–100.0)
Monocytes Absolute: 1 10*3/uL (ref 0.1–1.0)
Monocytes Relative: 7.7 % (ref 3.0–12.0)
NEUTROS ABS: 8.8 10*3/uL — AB (ref 1.4–7.7)
Neutrophils Relative %: 65.9 % (ref 43.0–77.0)
Platelets: 236 10*3/uL (ref 150.0–400.0)
RBC: 5.68 Mil/uL — AB (ref 3.87–5.11)
RDW: 17.1 % — AB (ref 11.5–15.5)
WBC: 13.4 10*3/uL — ABNORMAL HIGH (ref 4.0–10.5)

## 2014-01-15 LAB — HEPATIC FUNCTION PANEL
ALBUMIN: 3.9 g/dL (ref 3.5–5.2)
ALK PHOS: 51 U/L (ref 39–117)
ALT: 26 U/L (ref 0–35)
AST: 25 U/L (ref 0–37)
Bilirubin, Direct: 0 mg/dL (ref 0.0–0.3)
TOTAL PROTEIN: 7.7 g/dL (ref 6.0–8.3)
Total Bilirubin: 0.4 mg/dL (ref 0.2–1.2)

## 2014-01-15 MED ORDER — METHYLPREDNISOLONE ACETATE 80 MG/ML IJ SUSP
80.0000 mg | Freq: Once | INTRAMUSCULAR | Status: AC
  Administered 2014-01-15: 80 mg via INTRAMUSCULAR

## 2014-01-15 MED ORDER — PREDNISONE 10 MG PO TABS: ORAL_TABLET | ORAL | Status: DC

## 2014-01-15 MED ORDER — INSULIN LISPRO 100 UNIT/ML (KWIKPEN): PEN_INJECTOR | SUBCUTANEOUS | Status: DC

## 2014-01-15 NOTE — Patient Instructions (Signed)
Take 5mg  on Monday and Wenesday  Friday and  2.5 mg every other day and return in 4 weeks for a recheck

## 2014-01-15 NOTE — Progress Notes (Signed)
Pre visit review using our clinic review tool, if applicable. No additional management support is needed unless otherwise documented below in the visit note. 

## 2014-01-15 NOTE — Progress Notes (Signed)
   Subjective:    Patient ID: [name], female    DOB: 10/15/1940, 73 y.o.   MRN: [id number]  HPI Pt here f/u derm and UC for possible drug reaction.  Pt feels like it may be from coumadin but derm thought it was a bug bite.  Rash is very itchy.  No other complaintsl.    Review of Systems As above    Objective:   Physical Exam BP 142/90  Pulse 73  Temp(Src) 98.3 F (36.8 C) (Oral)  Wt 249 lb (112.946 kg)  SpO2 95% General appearance: alert, cooperative, appears stated age and no distress Neck: no adenopathy, supple, symmetrical, trachea midline and thyroid not enlarged, symmetric, no tenderness/mass/nodules Lungs: clear to auscultation bilaterally Heart: S1, S2 normal Skin: erythema - face, neck, torso, arm(s) bilateral, upper leg(s) bilateral, lower leg(s) bilateral and papular - face, head, neck, torso, arm(s) bilateral, back, upper leg(s) bilateral, lower leg(s) bilateral        Assessment & Plan:  1. Rash and nonspecific skin eruption ? etiology - predniSONE (DELTASONE) 10 MG tablet; 3 po qd for 3 days then 2 po qd for 3 days the 1 po qd for 3 days  Dispense: 18 tablet; Refill: 0 - Basic metabolic panel - CBC with Differential - Hepatic function panel - Sed Rate (ESR) - ANA - B. burgdorfi antibodies - Rocky mtn spotted fvr ab, IgM-blood - methylPREDNISolone acetate (DEPO-MEDROL) injection 80 mg; Inject 1 mL (80 mg total) into the muscle once. - Sed Rate (ESR)  2. Type II or unspecified type diabetes mellitus with unspecified complication, uncontrolled  - insulin lispro (HUMALOG KWIKPEN) 100 UNIT/ML KiwkPen; As directed using sliding scale  Dispense: 15 mL; Refill: 1 - Basic metabolic panel - CBC with Differential - Hepatic function panel - Sed Rate (ESR) - ANA - B. burgdorfi antibodies - Rocky mtn spotted fvr ab, IgM-blood - Sed Rate (ESR)

## 2014-01-15 NOTE — Patient Instructions (Signed)
Sliding scale insulin 200-250     2u 250-300     4u  301-350      6 u 351-400      8 u > 400  10 u  And call DR  Check glucose 4x a day

## 2014-01-19 ENCOUNTER — Telehealth: Payer: Self-pay | Admitting: Family Medicine

## 2014-01-19 DIAGNOSIS — W57XXXA Bitten or stung by nonvenomous insect and other nonvenomous arthropods, initial encounter: Secondary | ICD-10-CM

## 2014-01-19 LAB — ROCKY MTN SPOTTED FVR AB, IGM-BLOOD: ROCKY MTN SPOTTED FEVER, IGM: 0.98 IV — AB

## 2014-01-19 LAB — B. BURGDORFI ANTIBODIES: B burgdorferi Ab IgG+IgM: 0.58 {ISR}

## 2014-01-19 LAB — ANA: Anti Nuclear Antibody(ANA): NEGATIVE

## 2014-01-19 MED ORDER — DOXYCYCLINE HYCLATE 100 MG PO TABS: ORAL_TABLET | ORAL | Status: DC

## 2014-01-19 NOTE — Telephone Encounter (Signed)
Based on the indeterminate RMSF titers, would start Doxycycline 100mg  BID x14 days and repeat titers in 1 week at lab visit.

## 2014-01-19 NOTE — Telephone Encounter (Signed)
Caller name: Aubreyana   Call back number:[phone]   Reason for call:   Pt wants to know lab results and what to do next

## 2014-01-19 NOTE — Telephone Encounter (Signed)
Agree that if pt has any concerns about her coumadin- she should definitely call her cardiologist.  Also, will need her INR checked b/c the antibiotics can change her coumadin levels

## 2014-01-19 NOTE — Telephone Encounter (Signed)
Recommendations please?

## 2014-01-19 NOTE — Telephone Encounter (Signed)
Reviewed recommendations with patient. Patient states that she is convinced that it is the Coumadin that she takes (?) has been taking since April symptoms started 2 1/2 weeks ago. Patient states that she had ask if she should call Dr Carmelina Noun office? Patient was advised that she should call Dr Carmelina Noun office due to the antibiotic use and to let them know what is happening. Patient agrees to call on Tues 01/20/2014. Patient hesitate to take doxy but after explanations and encouragement agrees. Rx sent to pharmacy, lab appt made for 1 week.

## 2014-01-19 NOTE — Telephone Encounter (Signed)
No plan documented. Will forward to [facility] for follow up.      KP

## 2014-01-20 ENCOUNTER — Telehealth: Payer: Self-pay | Admitting: Family Medicine

## 2014-01-20 NOTE — Telephone Encounter (Signed)
Patient aware.

## 2014-01-20 NOTE — Telephone Encounter (Signed)
[facility] called and stated that there was not enough specimen to perform SedRate lab for pt.   You call back if needed.  # [phone] option 3.  Ref # [id number]

## 2014-01-22 ENCOUNTER — Telehealth: Payer: Self-pay | Admitting: Cardiology

## 2014-01-22 NOTE — Telephone Encounter (Signed)
Patient is curious if she could be allergic to warfarin. Please call and advise.

## 2014-01-22 NOTE — Telephone Encounter (Signed)
Left message for patient to call office and ask for triage nurse 

## 2014-01-23 ENCOUNTER — Other Ambulatory Visit: Payer: Self-pay | Admitting: Family Medicine

## 2014-01-26 ENCOUNTER — Other Ambulatory Visit: Payer: Medicare Other

## 2014-01-26 ENCOUNTER — Telehealth: Payer: Self-pay | Admitting: Family Medicine

## 2014-01-26 DIAGNOSIS — E785 Hyperlipidemia, unspecified: Secondary | ICD-10-CM

## 2014-01-26 NOTE — Telephone Encounter (Signed)
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

## 2014-02-02 ENCOUNTER — Other Ambulatory Visit: Payer: Medicare Other

## 2014-02-02 DIAGNOSIS — W57XXXA Bitten or stung by nonvenomous insect and other nonvenomous arthropods, initial encounter: Secondary | ICD-10-CM

## 2014-02-03 ENCOUNTER — Encounter: Payer: Self-pay | Admitting: Family Medicine

## 2014-02-03 LAB — ROCKY MTN SPOTTED FVR AB, IGM-BLOOD: ROCKY MTN SPOTTED FEVER, IGM: 0.87 IV

## 2014-02-12 ENCOUNTER — Ambulatory Visit (INDEPENDENT_AMBULATORY_CARE_PROVIDER_SITE_OTHER): Payer: Medicare Other | Admitting: *Deleted

## 2014-02-12 ENCOUNTER — Other Ambulatory Visit: Payer: Self-pay | Admitting: Family Medicine

## 2014-02-12 VITALS — BP 134/74 | HR 75 | Temp 98.0°F | Wt 251.0 lb

## 2014-02-12 DIAGNOSIS — D735 Infarction of spleen: Secondary | ICD-10-CM

## 2014-02-12 DIAGNOSIS — I480 Paroxysmal atrial fibrillation: Secondary | ICD-10-CM

## 2014-02-12 DIAGNOSIS — N280 Ischemia and infarction of kidney: Secondary | ICD-10-CM

## 2014-02-12 LAB — POCT INR: INR: 1.8

## 2014-02-12 NOTE — Patient Instructions (Signed)
Take 2.5mg  on Monday and Wenesday  Friday and  5 mg every other day and return in 2 weeks for a recheck @ [place]

## 2014-02-26 ENCOUNTER — Ambulatory Visit (INDEPENDENT_AMBULATORY_CARE_PROVIDER_SITE_OTHER): Payer: Medicare Other

## 2014-02-26 VITALS — BP 125/70 | HR 93 | Temp 98.4°F | Wt 253.6 lb

## 2014-02-26 DIAGNOSIS — Z7901 Long term (current) use of anticoagulants: Secondary | ICD-10-CM

## 2014-02-26 DIAGNOSIS — I4891 Unspecified atrial fibrillation: Secondary | ICD-10-CM

## 2014-02-26 DIAGNOSIS — N280 Ischemia and infarction of kidney: Secondary | ICD-10-CM

## 2014-02-26 DIAGNOSIS — D735 Infarction of spleen: Secondary | ICD-10-CM

## 2014-02-26 DIAGNOSIS — I480 Paroxysmal atrial fibrillation: Secondary | ICD-10-CM

## 2014-02-26 LAB — POCT INR: INR: 2.8

## 2014-02-26 NOTE — Progress Notes (Signed)
Pt states she taking 5 mg of Coumadin on M, W, F, and Sunday and 2.5 mg on  T, Th, and Sat.    Later during visit, pt states that she takes Coumadin 2.5 mg on M, W, F and 5 mg all other days--which matches previous coumadin clinic notes (02/12/14).

## 2014-02-26 NOTE — Patient Instructions (Addendum)
Continue to take 2.5 mg on Monday, Wednesday and Friday and  5 mg all other day.   Return to clinic 4 weeks for a recheck (around 03/26/14).

## 2014-02-26 NOTE — Progress Notes (Signed)
Pre visit review using our clinic review tool, if applicable. No additional management support is needed unless otherwise documented below in the visit note. 

## 2014-03-03 ENCOUNTER — Telehealth: Payer: Self-pay

## 2014-03-03 NOTE — Telephone Encounter (Signed)
Form reviewed and signed by Dr. [name].  Pt is aware.  Form placed up front ready for pick up.

## 2014-03-03 NOTE — Telephone Encounter (Signed)
Received Renewal for Permanent Disability Parking Placard form from patient.  Form completed.  Placed in Dr. Nonda Lou red folder for review and signature.

## 2014-03-06 ENCOUNTER — Other Ambulatory Visit: Payer: Self-pay | Admitting: Family Medicine

## 2014-03-09 ENCOUNTER — Other Ambulatory Visit: Payer: Self-pay | Admitting: *Deleted

## 2014-03-09 ENCOUNTER — Ambulatory Visit (INDEPENDENT_AMBULATORY_CARE_PROVIDER_SITE_OTHER): Payer: Medicare Other | Admitting: Cardiology

## 2014-03-09 ENCOUNTER — Ambulatory Visit (INDEPENDENT_AMBULATORY_CARE_PROVIDER_SITE_OTHER): Payer: Medicare Other | Admitting: *Deleted

## 2014-03-09 ENCOUNTER — Encounter: Payer: Self-pay | Admitting: Cardiology

## 2014-03-09 ENCOUNTER — Telehealth: Payer: Self-pay | Admitting: Family Medicine

## 2014-03-09 VITALS — BP 130/84 | HR 71 | Ht 66.0 in | Wt 252.0 lb

## 2014-03-09 DIAGNOSIS — D735 Infarction of spleen: Secondary | ICD-10-CM

## 2014-03-09 DIAGNOSIS — I4891 Unspecified atrial fibrillation: Secondary | ICD-10-CM

## 2014-03-09 DIAGNOSIS — I480 Paroxysmal atrial fibrillation: Secondary | ICD-10-CM

## 2014-03-09 DIAGNOSIS — E785 Hyperlipidemia, unspecified: Secondary | ICD-10-CM

## 2014-03-09 DIAGNOSIS — N280 Ischemia and infarction of kidney: Secondary | ICD-10-CM

## 2014-03-09 DIAGNOSIS — D7389 Other diseases of spleen: Secondary | ICD-10-CM

## 2014-03-09 DIAGNOSIS — N2889 Other specified disorders of kidney and ureter: Secondary | ICD-10-CM

## 2014-03-09 DIAGNOSIS — I739 Peripheral vascular disease, unspecified: Secondary | ICD-10-CM

## 2014-03-09 LAB — POCT INR: INR: 2.7

## 2014-03-09 MED ORDER — WARFARIN SODIUM 2.5 MG PO TABS: ORAL_TABLET | ORAL | Status: DC

## 2014-03-09 NOTE — Patient Instructions (Signed)
Coumadin clinic will check your coumadin today and help you make the transition from coumadin to Eliquis 5mg  two times a day.   Your physician wants you to follow-up in: 6 months with Dr [name]. (February 2016). You will receive a reminder letter in the mail two months in advance. If you don't receive a letter, please call our office to schedule the follow-up appointment.

## 2014-03-09 NOTE — Telephone Encounter (Signed)
Rx faxed.    KP 

## 2014-03-09 NOTE — Patient Instructions (Addendum)
Pt was started on Eliquis for Afib on 03/10/14.    A full discussion of the nature of anticoagulants has been carried out.  A benefit/risk analysis has been presented to the patient, so that they understand the justification for choosing anticoagulation with Eliquis at this time.  The need for compliance is stressed.  Pt is aware to take the medication twice daily.  Side effects of potential bleeding are discussed, including unusual colored urine or stools, coughing up blood or coffee ground emesis, nose bleeds or serious fall or head trauma.  Discussed signs and symptoms of stroke. The patient should avoid any OTC items containing aspirin or ibuprofen.  Avoid alcohol consumption.   Call if any signs of abnormal bleeding.  Discussed financial obligations and resolved any difficulty in obtaining medication.  Next lab test test in 1 month.

## 2014-03-09 NOTE — Telephone Encounter (Signed)
Caller name: [name] Relation to pt: Call back number: [phone] Pharmacy: [facility]   Reason for call:  Pt states that the pharmacy is saying that her Rx warfarin (COUMADIN) 2.5 MG is saying take 1 tablet daily.  It is supposed to be 2.5 mg Mon, Wed, and Friday.  Then 5.0 mg Tue, Thur, Sat, and Sun.  It has to state this.  Please advise and resend to pharmacy.

## 2014-03-10 NOTE — Progress Notes (Signed)
Patient ID: Heidi Stark, female   DOB: [birth year], 73 y.o.   MRN: [id number] PCP: Dr. [name]  73 yo with history of HTN, DM, hyperlipidemia presents for cardiology followup.  She was admitted in 4/15 with splenic and renal infarcts.  She was found to have paroxysmal atrial fibrillation.  In 4/15, she had fever and flank pain.  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  TEE showed prominent aortic plaque.  While she was in the hospital, she was noted to have a brief run of atrial fibrillation and was started on coumadin.    She has been stable since getting home.  She has not felt any tachypalpitations.  30 day event monitor showed no atrial fibrillation.  Mild fatigue in general.  No abdominal pain.  No dyspnea walking on flat ground, mild dyspnea with steps.  No chest pain.  She has left leg pain with ambulation after about 100 feet, LE dopplers 5/15 showed occluded left external iliac artery.  This has been present ever since she had left iliac damage with a prior back surgery.  Recently, she has had a diffuse erythematous rash.  She is concerned that this could be due to warfarin.  No melena/BRBPR.   ECG: NSR, left axis deviation, lateral T wave flattening  Labs (4/15): K 4.1, creatinine 0.7, HCT 46.2 Labs (7/15): K 3.9, creatinine 0.8, HCT 50  PMH: 1.Type II diabetes 2. HTN 3. Hyperlipidemia 4. COPD 5. LHC (2007) reportedly normal.  6. Paroxysmal atrial fibrillation: First noted in 4/15.  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  TEE (4/15) with EF 50-55%, mild LVH, grade III-IV plaque in descending thoracic aorta, RV normal, no PFO, peak RV-RA gradient 42 mmHg.  Event monitor (4/15) with no atrial fibrillation.  7. H/o diskectomy. 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 Lower extremity arterial dopplers (5/15) with occluded left external iliac artery.   SH: Married, 2 kids, quit smoking in  2015.  FH: Daughter with factor V Leiden deficiency.    ROS: All systems reviewed and negative except as per HPI.   Current Outpatient Prescriptions  Medication Sig Dispense Refill  . albuterol (PROVENTIL) (2.5 MG/3ML) 0.083% nebulizer solution Take 3 mLs (2.5 mg total) by nebulization 4 (four) times daily as needed for wheezing.  75 mL  2  . fenofibrate 160 MG tablet TAKE 1 TABLET BY MOUTH EVERY DAY PATIENT DUE FOR LABS  30 tablet  0  . Fluticasone-Salmeterol (ADVAIR) 250-50 MCG/DOSE AEPB Inhale 1 puff into the lungs every 12 (twelve) hours. As needed for shortness of breath      . furosemide (LASIX) 20 MG tablet Take 20 mg by mouth 2 (two) times daily.      Marland Kitchen glimepiride (AMARYL) 2 MG tablet Take 2 mg by mouth daily with supper.       Marland Kitchen glucose blood test strip Check Blood Sugars four time daily  100 each  2  . HYDROcodone-acetaminophen (NORCO/VICODIN) 5-325 MG per tablet Take 1-2 tablets by mouth every 6 (six) hours as needed for moderate pain or severe pain.  65 tablet  0  . INVOKANA 100 MG TABS TAKE 1 TABLET BY MOUTH EVERY DAY PATIENT DUE FOR LABS  30 tablet  0  . KAZANO 12.11-998 MG TABS TAKE 1 TABLET BY MOUTH TWICE A DAY PATIENT DUE FOR LABS  60 tablet  0  . lisinopril (PRINIVIL,ZESTRIL) 5 MG tablet Take  5 mg by mouth daily.      . metoprolol tartrate (LOPRESSOR) 25 MG tablet TAKE 1/2 TABLET BY MOUTH TWICE A DAY  30 tablet  0  . potassium chloride (K-DUR) 10 MEQ tablet Take 10 mEq by mouth 2 (two) times daily.      . predniSONE (DELTASONE) 10 MG tablet Take 50 mg tablet today and taper down by 10 mg daily until completed  15 tablet  0  . rosuvastatin (CRESTOR) 20 MG tablet Take 20 mg by mouth daily.      Marland Kitchen warfarin (COUMADIN) 2.5 MG tablet Take 1 tablet (2.5 mg total) by mouth daily. Start taking tomorrow 11/04/2013  30 tablet  0   No current facility-administered medications for this visit.    BP 130/65  Pulse 60  Ht 5\' 9"  (1.753 m)  Wt 111.131 kg (245 lb)  BMI 36.16  kg/m2 General: NAD Neck: No JVD, no thyromegaly or thyroid nodule.  Lungs: Clear to auscultation bilaterally with normal respiratory effort. CV: Nondisplaced PMI.  Heart regular S1/S2, no S3/S4, no murmur.  No peripheral edema.  No carotid bruit.  I am unable to palpate left PT pulse.  Abdomen: Soft, nontender, no hepatosplenomegaly, no distention.  Skin: Intact without lesions or rashes.  Neurologic: Alert and oriented x 3.  Psych: Normal affect. Extremities: No clubbing or cyanosis.  HEENT: Normal.   Assessment/Plan: 1. Atrial fibrillation: Paroxysmal.  1 episode noted in the hospital.  Suspect the splenic and renal infarcts were cardio-embolic.  30 day monitor in 4/15 showed no further atrial fibrillation.   - Continue metoprolol. - It is possible that her rash is a drug eruption.  Warfarin is a relatively new med for her.  I am going to stop warfarin and when INR < 2, she will start Eliquis 5 mg bid.   2. Claudication: Left leg claudication with absent left PT pulse.  Suspect this is related to prior damage to the iliac artery on that side, peripheral arterial dopplers in 5/15 confirmed this.  3. Obesity: I counseled her to work on weight loss.  4. Hyperlipidemia: Goal LDL ideally < 70.  Check lipids today.    [name] 03/10/2014

## 2014-04-06 ENCOUNTER — Ambulatory Visit (INDEPENDENT_AMBULATORY_CARE_PROVIDER_SITE_OTHER): Payer: Medicare Other | Admitting: *Deleted

## 2014-04-06 DIAGNOSIS — D735 Infarction of spleen: Secondary | ICD-10-CM

## 2014-04-06 DIAGNOSIS — I4891 Unspecified atrial fibrillation: Secondary | ICD-10-CM

## 2014-04-06 DIAGNOSIS — D7389 Other diseases of spleen: Secondary | ICD-10-CM

## 2014-04-06 LAB — BASIC METABOLIC PANEL
BUN: 20 mg/dL (ref 6–23)
CO2: 28 mEq/L (ref 19–32)
Calcium: 8.7 mg/dL (ref 8.4–10.5)
Chloride: 102 mEq/L (ref 96–112)
Creatinine, Ser: 0.6 mg/dL (ref 0.4–1.2)
GFR: 96.88 mL/min (ref >60.00)
Glucose, Bld: 70 mg/dL (ref 70–99)
Potassium: 3.5 mEq/L (ref 3.5–5.1)
Sodium: 143 mEq/L (ref 135–145)

## 2014-04-06 LAB — CBC
HCT: 47.6 % — ABNORMAL HIGH (ref 36.0–46.0)
Hemoglobin: 15.6 g/dL — ABNORMAL HIGH (ref 12.0–15.0)
MCHC: 32.7 g/dL (ref 30.0–36.0)
MCV: 89.5 fl (ref 78.0–100.0)
Platelets: 299 10*3/uL (ref 150.0–400.0)
RBC: 5.32 Mil/uL — ABNORMAL HIGH (ref 3.87–5.11)
RDW: 16.1 % — ABNORMAL HIGH (ref 11.5–15.5)
WBC: 11.2 10*3/uL — AB (ref 4.0–10.5)

## 2014-04-06 MED ORDER — APIXABAN 5 MG PO TABS: 5.0000 mg | ORAL_TABLET | Freq: Two times a day (BID) | ORAL | Status: DC

## 2014-04-06 MED ORDER — APIXABAN 5 MG PO TABS
5.0000 mg | ORAL_TABLET | Freq: Two times a day (BID) | ORAL | Status: DC
Start: 2014-04-06 — End: 2014-04-06

## 2014-04-06 MED ORDER — APIXABAN 5 MG PO TABS
5.0000 mg | ORAL_TABLET | Freq: Two times a day (BID) | ORAL | Status: DC
Start: 2014-04-06 — End: 2014-09-09

## 2014-04-06 NOTE — Patient Instructions (Signed)

## 2014-04-07 ENCOUNTER — Telehealth: Payer: Self-pay

## 2014-04-07 NOTE — Progress Notes (Signed)
Pt was started on Eliquis for Afib  on 03/10/14.    Reviewed patients medication list.  Pt is not  currently on any combined P-gp and strong CYP3A4 inhibitors/inducers (ketoconazole, traconazole, ritonavir, carbamazepine, phenytoin, rifampin, St. John's wort).  Reviewed labs.  SCr 0.6,  Weight 114 Kg, Age 73 yrs.  Dose appropriate based on specified criteria.   Hgb and HCT 15.6/47.6. Pt aware of lab results. Rash is improved since switching to Eliquis but she has also seen dermatologist for topical meds.   A full discussion of the nature of anticoagulants has been carried out.  A benefit/risk analysis has been presented to the patient, so that they understand the justification for choosing anticoagulation with Eliquis at this time.  The need for compliance is stressed.  Pt is aware to take the medication twice daily.  Side effects of potential bleeding are discussed, including unusual colored urine or stools, coughing up blood or coffee ground emesis, nose bleeds or serious fall or head trauma.  Discussed signs and symptoms of stroke. The patient should avoid any OTC items containing aspirin or ibuprofen.  Avoid alcohol consumption.   Call if any signs of abnormal bleeding.  Discussed financial obligations and resolved any difficulty in obtaining medication. Follow up with Dr [name] as directed.

## 2014-04-07 NOTE — Telephone Encounter (Signed)
Pt stated they wcb and schedule AWV.   *schedule with Eula Listen, PA unless pt insist on PCP doing AWV

## 2014-04-09 ENCOUNTER — Telehealth: Payer: Self-pay | Admitting: *Deleted

## 2014-04-09 NOTE — Telephone Encounter (Signed)
PA [phone] for Eliquis 5mg  bid through 04/10/15--PA [id number]. [facility] [phone] notified.

## 2014-04-15 LAB — HM DIABETES EYE EXAM

## 2014-05-01 ENCOUNTER — Other Ambulatory Visit: Payer: Self-pay | Admitting: *Deleted

## 2014-05-01 MED ORDER — ALOGLIPTIN-METFORMIN HCL 12.5-1000 MG PO TABS: ORAL_TABLET | ORAL | Status: DC

## 2014-05-01 NOTE — Telephone Encounter (Signed)
Kazano refilled per protocol. JG//CMA

## 2014-05-12 ENCOUNTER — Telehealth: Payer: Self-pay | Admitting: Cardiology

## 2014-05-12 NOTE — Telephone Encounter (Signed)
Left message on Dr. Benna Dunks private  voice mail that it is OK to hold Eliquis 48 hours prior to surgery and restart as soon as possible per Dr.McLean and Elberta Leatherwood Pharm D.

## 2014-05-12 NOTE — Telephone Encounter (Signed)
Minimize time off Eliquis, would talk to pharmacy about what would be minimal acceptable time off Eliquis to do surgery based on GFR, etc, as she has history of cardioembolic events.

## 2014-05-12 NOTE — Telephone Encounter (Signed)
Hold Eliquis 48 hrs then restart as soon as possible afterwards.

## 2014-05-12 NOTE — Telephone Encounter (Signed)
To Heidi Stark for review and recommendations.

## 2014-05-12 NOTE — Telephone Encounter (Signed)
New message     Office calling eyes lid surgery,. Pat need to stop eliquis 5 mg prior to surgery.

## 2014-05-12 NOTE — Telephone Encounter (Signed)
Spoke with Dr. Ellie Lunch about possible surgery for patient.  Dr. Ellie Lunch states that the surgery is not urgent, and is not scheduled yet. She knows the patient has a history of blood clots and wants to make sure the patient can stop Eliquis to do the surgery.  If she can stop the medication, Dr. Ellie Lunch asks how long she can stop the medication so they can schedule.  To Dr. [name] for review and recommendations.

## 2014-05-13 NOTE — Telephone Encounter (Signed)
There was not enough speciman to perform test , we did not repeat lab.  ST

## 2014-05-14 ENCOUNTER — Other Ambulatory Visit: Payer: Self-pay

## 2014-05-14 MED ORDER — FLUTICASONE-SALMETEROL 250-50 MCG/DOSE IN AEPB: 1.0000 | INHALATION_SPRAY | Freq: Two times a day (BID) | RESPIRATORY_TRACT | Status: DC

## 2014-05-29 ENCOUNTER — Other Ambulatory Visit: Payer: Self-pay | Admitting: Family Medicine

## 2014-06-02 ENCOUNTER — Other Ambulatory Visit: Payer: Self-pay | Admitting: Family Medicine

## 2014-06-08 ENCOUNTER — Other Ambulatory Visit: Payer: Self-pay | Admitting: Family Medicine

## 2014-06-10 ENCOUNTER — Other Ambulatory Visit: Payer: Self-pay | Admitting: Family Medicine

## 2014-06-12 ENCOUNTER — Other Ambulatory Visit: Payer: Self-pay | Admitting: Family Medicine

## 2014-06-15 ENCOUNTER — Other Ambulatory Visit: Payer: Self-pay | Admitting: Family Medicine

## 2014-06-23 LAB — BASIC METABOLIC PANEL WITH GFR
BUN: 18 mg/dL (ref 4–21)
Creatinine: 0.7 mg/dL (ref 0.5–1.1)
Glucose: 208 mg/dL
Potassium: 5 mmol/L (ref 3.4–5.3)
Sodium: 142 mmol/L (ref 137–147)

## 2014-06-23 LAB — LIPID PANEL
Cholesterol: 136 mg/dL (ref 0–200)
HDL: 33 mg/dL — AB (ref 35–70)
LDL Cholesterol: 57 mg/dL
Triglycerides: 232 mg/dL — AB (ref 40–160)

## 2014-06-23 LAB — HEMOGLOBIN A1C: HEMOGLOBIN A1C: 7.3 % — AB (ref 4.0–6.0)

## 2014-08-03 ENCOUNTER — Other Ambulatory Visit: Payer: Self-pay | Admitting: Family Medicine

## 2014-08-03 ENCOUNTER — Telehealth: Payer: Self-pay | Admitting: *Deleted

## 2014-08-03 MED ORDER — ALOGLIPTIN-METFORMIN HCL 12.5-1000 MG PO TABS: ORAL_TABLET | ORAL | Status: DC

## 2014-08-03 NOTE — Telephone Encounter (Signed)
Patient calling with request to refill Kazano, stating that she does not have enough to finish the day.  Rx filled for 1 month supply.  Patient due for diabetic follow-up appointment.  Called and left voicemail for patient to notify her of need for appointment.    eal

## 2014-08-03 NOTE — Telephone Encounter (Signed)
Spoke with patient regarding follow-up appointment and stated that Dr. [name] still needed to see her for other chronic medical issues.  Patient verbalized understanding.  Patient requested recent labs from Dr. [name] be faxed to office.  Called Dr. Almetta Lovely office and contacted medical records, left fax number.      eal

## 2014-08-03 NOTE — Telephone Encounter (Signed)
Patient would like a callback. She doesn't know why she has to be seen for a diabetic follow up when she already has an endocrinologist. Best # [phone]

## 2014-08-11 ENCOUNTER — Other Ambulatory Visit: Payer: Self-pay | Admitting: Family Medicine

## 2014-08-28 ENCOUNTER — Other Ambulatory Visit: Payer: Self-pay | Admitting: Family Medicine

## 2014-08-29 ENCOUNTER — Other Ambulatory Visit: Payer: Self-pay | Admitting: Family Medicine

## 2014-09-09 ENCOUNTER — Ambulatory Visit (INDEPENDENT_AMBULATORY_CARE_PROVIDER_SITE_OTHER): Payer: Medicare Other | Admitting: Cardiology

## 2014-09-09 ENCOUNTER — Encounter: Payer: Self-pay | Admitting: Cardiology

## 2014-09-09 ENCOUNTER — Other Ambulatory Visit: Payer: Self-pay | Admitting: Family Medicine

## 2014-09-09 VITALS — BP 124/62 | HR 83 | Ht 66.0 in | Wt 268.0 lb

## 2014-09-09 DIAGNOSIS — I739 Peripheral vascular disease, unspecified: Secondary | ICD-10-CM

## 2014-09-09 DIAGNOSIS — I480 Paroxysmal atrial fibrillation: Secondary | ICD-10-CM

## 2014-09-09 LAB — CBC WITH DIFFERENTIAL/PLATELET
BASOS PCT: 0.4 % (ref 0.0–3.0)
Basophils Absolute: 0 10*3/uL (ref 0.0–0.1)
EOS ABS: 0.2 10*3/uL (ref 0.0–0.7)
Eosinophils Relative: 1.7 % (ref 0.0–5.0)
HCT: 44.7 % (ref 36.0–46.0)
Hemoglobin: 14.8 g/dL (ref 12.0–15.0)
LYMPHS PCT: 30.1 % (ref 12.0–46.0)
Lymphs Abs: 3.2 10*3/uL (ref 0.7–4.0)
MCHC: 33.2 g/dL (ref 30.0–36.0)
MCV: 89.6 fl (ref 78.0–100.0)
MONO ABS: 1.1 10*3/uL — AB (ref 0.1–1.0)
Monocytes Relative: 10.3 % (ref 3.0–12.0)
Neutro Abs: 6.2 10*3/uL (ref 1.4–7.7)
Neutrophils Relative %: 57.5 % (ref 43.0–77.0)
PLATELETS: 258 10*3/uL (ref 150.0–400.0)
RBC: 4.99 Mil/uL (ref 3.87–5.11)
RDW: 15.3 % (ref 11.5–15.5)
WBC: 10.7 10*3/uL — ABNORMAL HIGH (ref 4.0–10.5)

## 2014-09-09 NOTE — Progress Notes (Signed)
Patient ID: Heidi Stark, female   DOB: May 06, 1941, 74 y.o.   MRN: [id number] PCP: Dr. [name]  74 yo with history of HTN, DM, hyperlipidemia presents for cardiology followup.  She was admitted in 4/15 with splenic and renal infarcts.  She was found to have paroxysmal atrial fibrillation.  In 4/15, she had fever and flank pain.  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  TEE showed prominent aortic plaque.  While she was in the hospital, she was noted to have a brief run of atrial fibrillation and was started on coumadin.    Symptomatically stable.  She has not felt any tachypalpitations. 30 day event monitor in 4/15 showed no atrial fibrillation.  Mild fatigue in general.  No abdominal pain.  No dyspnea walking on flat ground, mild dyspnea with steps.  No chest pain.  She has left leg pain with ambulation after about 100 feet, LE dopplers 5/15 showed occluded left external iliac artery.  This has been present ever since she had left iliac damage with a prior back surgery.  No melena/BRBPR.  Her weight is up a lot.  She attributes this to having a lot of family stress and "stress eating."    ECG: NSR, PVC, iRBBB, QTc 470 msec  Labs (4/15): K 4.1, creatinine 0.7, HCT 46.2 Labs (7/15): K 3.9, creatinine 0.8, HCT 50 Labs (12/15): K 5, creatinine 0.7, HDL 33, LDL 57  PMH: 1.Type II diabetes 2. HTN 3. Hyperlipidemia 4. COPD 5. LHC (2007) reportedly normal.  6. Paroxysmal atrial fibrillation: First noted in 4/15.  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  TEE (4/15) with EF 50-55%, mild LVH, grade III-IV plaque in descending thoracic aorta, RV normal, no PFO, peak RV-RA gradient 42 mmHg.  Event monitor (4/15) with no atrial fibrillation.  7. H/o diskectomy. 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 Lower extremity arterial dopplers (5/15) with occluded left external iliac artery.   SH: Married, 2 kids,  quit smoking in 2015.  FH: Daughter with factor V Leiden deficiency.  Brother with CABG.  Sister with aneurysm.   ROS: All systems reviewed and negative except as per HPI.   Current Outpatient Prescriptions  Medication Sig Dispense Refill  . apixaban (ELIQUIS) 5 MG TABS tablet Take 1 tablet (5 mg total) by mouth 2 (two) times daily. 60 tablet 0  . fenofibrate 160 MG tablet Take 1 tablet (160 mg total) by mouth daily. Repeat labs are due now 30 tablet 0  . Fluticasone-Salmeterol (ADVAIR) 250-50 MCG/DOSE AEPB Inhale 1 puff into the lungs every 12 (twelve) hours. As needed for shortness of breath 60 each 2  . furosemide (LASIX) 20 MG tablet Take 1 tablet (20 mg total) by mouth 2 (two) times daily. 180 tablet 1  . glimepiride (AMARYL) 2 MG tablet Take 2 mg by mouth daily with supper.     Marland Kitchen glucose blood test strip Check Blood Sugars four time daily 100 each 2  . KAZANO 12.11-998 MG TABS TAKE 1 TABLET BY MOUTH TWICE A DAY 60 tablet 5  . lisinopril (PRINIVIL,ZESTRIL) 5 MG tablet TAKE 1 TABLET BY MOUTH EVERY DAY 30 tablet 5  . metoprolol tartrate (LOPRESSOR) 25 MG tablet TAKE 1/2 TABLET BY MOUTH TWICE A DAY 30 tablet 2  . potassium chloride (KLOR-CON 10) 10 MEQ tablet Take 1 tablet (10 mEq total) by mouth 2 (two) times daily. 180 tablet 1  .  rosuvastatin (CRESTOR) 20 MG tablet Take 10 mg by mouth daily.      No current facility-administered medications for this visit.    BP 130/65  Pulse 60  Ht 5\' 9"  (1.753 m)  Wt 111.131 kg (245 lb)  BMI 36.16 kg/m2 General: NAD Neck: No JVD, no thyromegaly or thyroid nodule.  Lungs: Clear to auscultation bilaterally with normal respiratory effort. CV: Nondisplaced PMI.  Heart regular S1/S2, no S3/S4, no murmur.  No peripheral edema.  No carotid bruit.  I am unable to palpate left PT pulse.  Abdomen: Soft, nontender, no hepatosplenomegaly, no distention.  Skin: Intact without lesions or rashes.  Neurologic: Alert and oriented x 3.  Psych: Normal  affect. Extremities: No clubbing or cyanosis.  HEENT: Normal.   Assessment/Plan: 1. Atrial fibrillation: Paroxysmal.  1 episode noted in the hospital.  Suspect the splenic and renal infarcts were cardio-embolic.  30 day monitor in 4/15 showed no further atrial fibrillation.   - Continue metoprolol. - Continue Eliquis.  Check CBC today.   2. Claudication: Left leg claudication with absent left PT pulse.  Suspect this is related to prior damage to the iliac artery on that side, peripheral arterial dopplers in 5/15 confirmed this.  3. Obesity: I counseled her to work on weight loss.  4. Hyperlipidemia: Goal LDL ideally < 70.  LDL was 57 in 12/15.     [name] 09/09/2014

## 2014-09-09 NOTE — Patient Instructions (Signed)
Your physician recommends that you have  lab work today--CBCd  Your physician wants you to follow-up in: 6 months You will receive a reminder letter in the mail two months in advance. If you don't receive a letter, please call our office to schedule the follow-up appointment.

## 2014-09-10 ENCOUNTER — Other Ambulatory Visit: Payer: Self-pay | Admitting: Family Medicine

## 2014-09-18 ENCOUNTER — Telehealth: Payer: Self-pay | Admitting: Family Medicine

## 2014-09-18 NOTE — Telephone Encounter (Signed)
Called patient twice with no return call.  Left detailed message on voicemail with instructions for Saturday clinic with times and phone number.

## 2014-09-18 NOTE — Telephone Encounter (Signed)
She may need to be seen at sat clinic---  Can take extra amaryl tonight and see Dr Charlett Blake tomorrow in sat clinic This was forwarded late

## 2014-09-18 NOTE — Telephone Encounter (Signed)
This pt's PCP is Garnet Koyanagi and spoke with Colletta Maryland at [facility] and was advised to send note to [employer].

## 2014-09-18 NOTE — Telephone Encounter (Signed)
[facility] Day - Client TELEPHONE ADVICE RECORD [facility] Patient Name: Heidi Stark DOB: 10/26/1940 Initial Comment Caller states her blood sugar is 249 and can't get it down Nurse Assessment Nurse: Mechele Dawley, RN, Amy Date/Time (Eastern Time): 09/18/2014 3:47:53 PM Confirm and document reason for call. If symptomatic, describe symptoms. ---CALLER STATES THAT SHE WAS HAVING A HARD TIME SLEEPING LAST NIGHT. AT 0330 SHE FINALLY GOT UP AND STAYED. HER BS WAS 241 - AND SHE WAS CONFUSED AS TO WHY IT WAS THAT. SHE STATES SHE HAD BREAKFAST AND IT WAS 181. SHE HAD A FISH SANDWICH FOR LUNCH AND IT WILL NOT COME BACK DOWN NOW. SHE ONLY TAKES ORAL MEDS. SHE DENIES EVERY HAVING IT THIS ELEVATED BEFORE. Has the patient traveled out of the country within the last 30 days? ---Not Applicable Does the patient require triage? ---Yes Related visit to physician within the last 2 weeks? ---No Does the PT have any chronic conditions? (i.e. diabetes, asthma, etc.) ---Yes List chronic conditions. ---DIABETIC, HTN, BLOOD THINNER Guidelines Guideline Title Affirmed Question Affirmed Notes Diabetes - High Blood Sugar [1] Blood glucose > 240 mg/dl (13 mmol/ l) AND [2] does not use insulin (e.g., not insulin-dependent; most type 2 diabetics) (all triage questions negative) Final Disposition [place], RN, Amy Comments ATTEMPTED TO SCHEDULE APPT FOR TODAY. NONE IS AVAILABLE. SHE STATES THAT SHE WILL DRINK PLENTY OF WATER AND THAT IF HER BS LEVELS ARE STILL ELEVATED THIS EVENING SHE WILL CALL BACK. SHE WILL ALSO CALL AND TRY TO GET INTO THE SATURDAY CLINIC.

## 2014-09-21 NOTE — Telephone Encounter (Signed)
Spoke to patient regarding sugars.  Patient says that she called [facility] office.  She was told that Dr. [name] office will call her back today and if she does not hear from them, she will call to make an appointment with Dr. [name].

## 2014-09-25 ENCOUNTER — Telehealth: Payer: Self-pay | Admitting: Family Medicine

## 2014-09-25 ENCOUNTER — Other Ambulatory Visit: Payer: Self-pay

## 2014-09-25 MED ORDER — FENOFIBRATE 160 MG PO TABS: 160.0000 mg | ORAL_TABLET | Freq: Every day | ORAL | Status: DC

## 2014-09-25 MED ORDER — LISINOPRIL 5 MG PO TABS: 5.0000 mg | ORAL_TABLET | Freq: Every day | ORAL | Status: DC

## 2014-09-25 MED ORDER — METOPROLOL TARTRATE 25 MG PO TABS: 12.5000 mg | ORAL_TABLET | Freq: Two times a day (BID) | ORAL | Status: DC

## 2014-09-25 NOTE — Telephone Encounter (Signed)
Dr. Nonda Lou patient.  Please advise.

## 2014-09-25 NOTE — Telephone Encounter (Signed)
Left a message for call back.  

## 2014-09-25 NOTE — Telephone Encounter (Signed)
If  Dr.  [name] is managing her diabetes: Okay to increase glimepiride 2 mg to two tablets in the morning. Watch for low sugar symptoms. If  other office managing her diabetes, needs to contact the other office

## 2014-09-25 NOTE — Telephone Encounter (Signed)
[facility]  Patient Name: Heidi Stark  DOB: 1941/02/12    Initial Comment Caller states her blood sugar is 324, xfer from office, on oral medication, but has been consistantly high, wants to know if adjustment needed before appt next month   Nurse Assessment  Nurse: Harlow Mares, RN, Suanne Marker Date/Time (Eastern Time): 09/25/2014 8:40:41 AM  Confirm and document reason for call. If symptomatic, describe symptoms. ---Caller states her blood sugar is 324, xfer from office, on oral medication, but has been consistently high, wants to know if adjustment needed before appt next month. Caller reports that the last week her BS has been 170-324. Was tranferred to Dr. Dewaine Conger for next month, and they didn't want to make changes until patient has been seen. Reports that she is currently taking Kazano 12.5/1000mg 's 1U in the am and 1U at hs. Glimepiride 2mg  takes bid.  Has the patient traveled out of the country within the last 30 days? ---No  Does the patient require triage? ---Yes  Related visit to physician within the last 2 weeks? ---No  Does the PT have any chronic conditions? (i.e. diabetes, asthma, etc.) ---Yes  List chronic conditions. ---diabetic (type 2); hypertension; takes blood thinner; hyperlipidemia; hx of blood clots in kidney and spleen, thus blood thinner     Guidelines    Guideline Title Affirmed Question Affirmed Notes  Diabetes - High Blood Sugar [1] Blood glucose > 240 mg/dl (13 mmol/l) AND [2] does not use insulin (e.g., not insulin-dependent; most type 2 diabetics) (all triage questions negative)    Final Disposition User   [place], RN, Suanne Marker

## 2014-10-01 NOTE — Telephone Encounter (Signed)
Called to follow up.  She says that her blood sugars are back to normal without any changes in medication.  This morning before breakfast BS was 114.  Yesterday, her bs were 120 at breakfast, 90 at noon, and 134 in the evening.

## 2014-10-29 ENCOUNTER — Encounter: Payer: Self-pay | Admitting: Medical

## 2014-10-29 ENCOUNTER — Ambulatory Visit (INDEPENDENT_AMBULATORY_CARE_PROVIDER_SITE_OTHER): Payer: Medicare Other | Admitting: Medical

## 2014-10-29 VITALS — BP 166/83 | HR 75 | Temp 98.3°F | Ht 66.0 in | Wt 255.6 lb

## 2014-10-29 DIAGNOSIS — L089 Local infection of the skin and subcutaneous tissue, unspecified: Secondary | ICD-10-CM | POA: Diagnosis not present

## 2014-10-29 MED ORDER — LORATADINE 10 MG PO TABS: 10.0000 mg | ORAL_TABLET | Freq: Every day | ORAL | Status: DC

## 2014-10-29 MED ORDER — DOXYCYCLINE HYCLATE 100 MG PO TABS: 100.0000 mg | ORAL_TABLET | Freq: Two times a day (BID) | ORAL | Status: DC

## 2014-10-29 NOTE — Progress Notes (Signed)
Subjective:    Patient ID: Heidi Stark, female    DOB: 07-May-1941, 74 y.o.   MRN: [id number]  HPI  Pt had stinging sensation/mild irritation sensation to left side of neck. Area was itching. No fever, no chills or sweats. No obvious pain on palpation. Area present for 2 days.  Pt cleaned area with alcohol.  No history of random skin infection in the past.  Pt is diabetic.  Pt just recently put on insulin.  Review of Systems  Constitutional: Negative for fever, chills and fatigue.  Respiratory: Negative for cough, chest tightness, shortness of breath and wheezing.   Cardiovascular: Negative for chest pain and palpitations.  Endocrine: Negative for polydipsia, polyphagia and polyuria.  Skin: Positive for rash.       Over left sternocleidmastoid area.   Neurological: Negative for dizziness, syncope, speech difficulty, weakness, numbness and headaches.  Hematological: Negative for adenopathy. Does not bruise/bleed easily.  Psychiatric/Behavioral: Negative for confusion.   Past Medical History  Diagnosis Date  . COPD (chronic obstructive pulmonary disease)   . Hypertension   . Hyperlipidemia   . Diabetes mellitus     Type II    History   Social History  . Marital Status: Married    Spouse Name: N/A  . Number of Children: N/A  . Years of Education: N/A   Occupational History  . Not on file.   Social History Main Topics  . Smoking status: Smoker, Current Status Unknown    Last Attempt to Quit: 09/14/2005  . Smokeless tobacco: Never Used  . Alcohol Use: No  . Drug Use: No  . Sexual Activity: Not on file   Other Topics Concern  . Not on file   Social History Narrative    Past Surgical History  Procedure Laterality Date  . Tubal ligation    . Knee surgery      Right Knee cart.Removal  . Cystectomy      between bladder and kidneys  . Back surgery      vein graft and colon repair after nicked artery with back surgert  . Ganglion cyst excision    . Tee  without cardioversion N/A 10/31/2013    Procedure: TRANSESOPHAGEAL ECHOCARDIOGRAM (TEE);  Surgeon: Larey Dresser, MD;  Location: [facility] ENDOSCOPY;  Service: Cardiovascular;  Laterality: N/A;    Family History  Problem Relation Age of Onset  . Diabetes    . Melanoma    . Factor V Leiden deficiency Daughter     Allergies  Allergen Reactions  . Sulfonamide Derivatives     Stomach cramps  . Neomycin-Bacitracin Zn-Polymyx Rash    Current Outpatient Prescriptions on File Prior to Visit  Medication Sig Dispense Refill  . fenofibrate 160 MG tablet Take 1 tablet (160 mg total) by mouth daily. 30 tablet 5  . Fluticasone-Salmeterol (ADVAIR) 250-50 MCG/DOSE AEPB Inhale 1 puff into the lungs every 12 (twelve) hours. As needed for shortness of breath 60 each 2  . furosemide (LASIX) 20 MG tablet Take 1 tablet (20 mg total) by mouth 2 (two) times daily. 180 tablet 1  . glimepiride (AMARYL) 2 MG tablet Take 2 mg by mouth daily with supper.     Marland Kitchen glucose blood test strip Check Blood Sugars four time daily 100 each 2  . KAZANO 12.11-998 MG TABS TAKE 1 TABLET BY MOUTH TWICE A DAY 60 tablet 5  . lisinopril (PRINIVIL,ZESTRIL) 5 MG tablet Take 1 tablet (5 mg total) by mouth daily. 30 tablet 5  .  metoprolol tartrate (LOPRESSOR) 25 MG tablet Take 0.5 tablets (12.5 mg total) by mouth 2 (two) times daily. 30 tablet 2  . potassium chloride (KLOR-CON 10) 10 MEQ tablet Take 1 tablet (10 mEq total) by mouth 2 (two) times daily. 180 tablet 1  . rosuvastatin (CRESTOR) 20 MG tablet Take 10 mg by mouth daily.     Marland Kitchen apixaban (ELIQUIS) 5 MG TABS tablet Take 1 tablet (5 mg total) by mouth 2 (two) times daily. 60 tablet 0   No current facility-administered medications on file prior to visit.    BP 166/83 mmHg  Pulse 75  Temp(Src) 98.3 F (36.8 C) (Oral)  Ht 5\' 6"  (1.676 m)  Wt 255 lb 9.6 oz (115.939 kg)  BMI 41.27 kg/m2  SpO2 97%         Objective:   Physical Exam  General  Mental Status - Alert. General  Appearance - Well groomed. Not in acute distress.  Skin Rashes- No Rashes.  HEENT Head- Normal. Ear Auditory Canal - Left- Normal. Right - Normal.Tympanic Membrane- Left- Normal. Right- Normal. Eye Sclera/Conjunctiva- Left- Normal. Right- Normal. Nose & Sinuses Nasal Mucosa- Left-  Not boggy or Congested. Right-  Not  boggy or Congested. Mouth & Throat Lips: Upper Lip- Normal: no dryness, cracking, pallor, cyanosis, or vesicular eruption. Lower Lip-Normal: no dryness, cracking, pallor, cyanosis or vesicular eruption. Buccal Mucosa- Bilateral- No Aphthous ulcers. Oropharynx- No Discharge or Erythema. Tonsils: Characteristics- Bilateral- No Erythema or Congestion. Size/Enlargement- Bilateral- No enlargement. Discharge- bilateral-None.  Neck Neck- Supple. No Masses.  Skin - over left sternocledimastoid area 2.5 cm red area indurated but no tenderness. Faint warm. NO vesicles or breakdown. No fluctuance.   Chest and Lung Exam Auscultation: Breath Sounds:- even and unlabored, but bilateral upper lobe rhonchi.  Cardiovascular Auscultation:Rythm- Regular, rate and rhythm. Murmurs & Other Heart Sounds:Ausculatation of the heart reveal- No Murmurs.  Lymphatic Head & Neck General Head & Neck Lymphatics: Bilateral: Description- No Localized lymphadenopathy.        Assessment & Plan:

## 2014-10-29 NOTE — Progress Notes (Signed)
Pre visit review using our clinic review tool, if applicable. No additional management support is needed unless otherwise documented below in the visit note. 

## 2014-10-29 NOTE — Patient Instructions (Signed)
Skin infection Skin infection likely early following local allergic reaction form insect bite. Rx doxycycline and claritin. Watch area closely and make sure not expanding or changing if so return here , uc or ED.   Follow up in 7 days or as needed.

## 2014-10-29 NOTE — Assessment & Plan Note (Signed)
Skin infection likely early following local allergic reaction form insect bite. Rx doxycycline and claritin. Watch area closely and make sure not expanding or changing if so return here , uc or ED.   Follow up in 7 days or as needed.

## 2014-11-05 ENCOUNTER — Ambulatory Visit (INDEPENDENT_AMBULATORY_CARE_PROVIDER_SITE_OTHER): Payer: Medicare Other | Admitting: Medical

## 2014-11-05 ENCOUNTER — Encounter: Payer: Self-pay | Admitting: Medical

## 2014-11-05 VITALS — BP 144/77 | HR 73 | Temp 98.3°F | Ht 66.0 in | Wt 254.2 lb

## 2014-11-05 DIAGNOSIS — L089 Local infection of the skin and subcutaneous tissue, unspecified: Secondary | ICD-10-CM

## 2014-11-05 NOTE — Progress Notes (Signed)
Pre visit review using our clinic review tool, if applicable. No additional management support is needed unless otherwise documented below in the visit note. 

## 2014-11-05 NOTE — Assessment & Plan Note (Signed)
Much improved/almost 100% improved. Continue antibiotic. No further tx needed after completes antibiotic unless area flares up again.

## 2014-11-05 NOTE — Patient Instructions (Signed)
Skin infection Much improved/almost 100% improved. Continue antibiotic. No further tx needed after completes antibiotic unless area flares up again.    Follow up as needed

## 2014-11-05 NOTE — Progress Notes (Signed)
Subjective:    Patient ID: Heidi Stark, female    DOB: 14-Sep-1940, 74 y.o.   MRN: [id number]  HPI  Pt in left side neck infection. Swelling, redness and tenderness took about 3 days to start to improve. But now significantly improved now. No fevers, no chills or sweats. Pt is currently on doxycycline. No side effects reported.   Review of Systems  Constitutional: Negative for fever, chills and fatigue.  HENT: Negative.   Respiratory: Negative for cough, choking and wheezing.   Cardiovascular: Negative for chest pain and palpitations.  Skin:       Lt side of neck. No redness, no swelling or tenderness.   Neurological: Negative for dizziness and headaches.  Hematological: Negative for adenopathy. Does not bruise/bleed easily.  Psychiatric/Behavioral: Negative for behavioral problems and confusion.   Past Medical History  Diagnosis Date  . COPD (chronic obstructive pulmonary disease)   . Hypertension   . Hyperlipidemia   . Diabetes mellitus     Type II    History   Social History  . Marital Status: Married    Spouse Name: N/A  . Number of Children: N/A  . Years of Education: N/A   Occupational History  . Not on file.   Social History Main Topics  . Smoking status: Current Every Day Smoker    Last Attempt to Quit: 09/14/2005  . Smokeless tobacco: Never Used  . Alcohol Use: No  . Drug Use: No  . Sexual Activity: Not on file   Other Topics Concern  . Not on file   Social History Narrative    Past Surgical History  Procedure Laterality Date  . Tubal ligation    . Knee surgery      Right Knee cart.Removal  . Cystectomy      between bladder and kidneys  . Back surgery      vein graft and colon repair after nicked artery with back surgert  . Ganglion cyst excision    . Tee without cardioversion N/A 10/31/2013    Procedure: TRANSESOPHAGEAL ECHOCARDIOGRAM (TEE);  Surgeon: Larey Dresser, MD;  Location: [facility] ENDOSCOPY;  Service: Cardiovascular;  Laterality: N/A;      Family History  Problem Relation Age of Onset  . Diabetes    . Melanoma    . Factor V Leiden deficiency Daughter     Allergies  Allergen Reactions  . Sulfonamide Derivatives     Stomach cramps  . Neomycin-Bacitracin Zn-Polymyx Rash    Current Outpatient Prescriptions on File Prior to Visit  Medication Sig Dispense Refill  . apixaban (ELIQUIS) 5 MG TABS tablet Take 1 tablet (5 mg total) by mouth 2 (two) times daily. 60 tablet 0  . doxycycline (VIBRA-TABS) 100 MG tablet Take 1 tablet (100 mg total) by mouth 2 (two) times daily. 14 tablet 0  . fenofibrate 160 MG tablet Take 1 tablet (160 mg total) by mouth daily. 30 tablet 5  . Fluticasone-Salmeterol (ADVAIR) 250-50 MCG/DOSE AEPB Inhale 1 puff into the lungs every 12 (twelve) hours. As needed for shortness of breath 60 each 2  . furosemide (LASIX) 20 MG tablet Take 1 tablet (20 mg total) by mouth 2 (two) times daily. 180 tablet 1  . glimepiride (AMARYL) 2 MG tablet Take 2 mg by mouth daily with supper.     Marland Kitchen glucose blood test strip Check Blood Sugars four time daily 100 each 2  . insulin glargine (LANTUS) 100 UNIT/ML injection Inject 10 Units into the skin at bedtime.  10 units qhs x 30 days    . KAZANO 12.11-998 MG TABS TAKE 1 TABLET BY MOUTH TWICE A DAY 60 tablet 5  . lisinopril (PRINIVIL,ZESTRIL) 5 MG tablet Take 1 tablet (5 mg total) by mouth daily. 30 tablet 5  . loratadine (CLARITIN) 10 MG tablet Take 1 tablet (10 mg total) by mouth daily. 30 tablet 0  . metoprolol tartrate (LOPRESSOR) 25 MG tablet Take 0.5 tablets (12.5 mg total) by mouth 2 (two) times daily. 30 tablet 2  . potassium chloride (KLOR-CON 10) 10 MEQ tablet Take 1 tablet (10 mEq total) by mouth 2 (two) times daily. 180 tablet 1  . rosuvastatin (CRESTOR) 20 MG tablet Take 10 mg by mouth daily.      No current facility-administered medications on file prior to visit.    BP 144/77 mmHg  Pulse 73  Temp(Src) 98.3 F (36.8 C) (Oral)  Ht 5\' 6"  (1.676 m)  Wt 254  lb 3.2 oz (115.304 kg)  BMI 41.05 kg/m2  SpO2 93%      Objective:   Physical Exam  General- No acute distress. Pleasant patient. Neck- Full range of motion, no jvd Lungs- Clear, even and unlabored. Heart- regular rate and rhythm. Neurologic- CNII- XII grossly intact. Neck- lt side prior area looks basically normal. No redness, no warmth, no tenderness, only  small swollen bb sized area. Just underneath the small scab. Significant improved.        Assessment & Plan:

## 2014-11-23 ENCOUNTER — Inpatient Hospital Stay (HOSPITAL_COMMUNITY)
Admission: EM | Admit: 2014-11-23 | Discharge: 2014-11-26 | DRG: 392 | Disposition: A | Discharge status: Home / Self Care | Payer: Medicare Other | Attending: Internal Medicine | Admitting: Internal Medicine

## 2014-11-23 ENCOUNTER — Encounter (HOSPITAL_COMMUNITY): Payer: Self-pay | Admitting: *Deleted

## 2014-11-23 ENCOUNTER — Ambulatory Visit (INDEPENDENT_AMBULATORY_CARE_PROVIDER_SITE_OTHER): Payer: Medicare Other | Admitting: Medical

## 2014-11-23 ENCOUNTER — Encounter: Payer: Self-pay | Admitting: Medical

## 2014-11-23 ENCOUNTER — Emergency Department (HOSPITAL_COMMUNITY): Payer: Medicare Other

## 2014-11-23 ENCOUNTER — Telehealth: Payer: Self-pay | Admitting: Lab

## 2014-11-23 VITALS — BP 128/79 | HR 85 | Temp 100.2°F | Ht 65.0 in | Wt 249.0 lb

## 2014-11-23 DIAGNOSIS — I480 Paroxysmal atrial fibrillation: Secondary | ICD-10-CM | POA: Diagnosis present

## 2014-11-23 DIAGNOSIS — A09 Infectious gastroenteritis and colitis, unspecified: Secondary | ICD-10-CM | POA: Diagnosis not present

## 2014-11-23 DIAGNOSIS — D72829 Elevated white blood cell count, unspecified: Secondary | ICD-10-CM

## 2014-11-23 DIAGNOSIS — N390 Urinary tract infection, site not specified: Secondary | ICD-10-CM

## 2014-11-23 DIAGNOSIS — Z794 Long term (current) use of insulin: Secondary | ICD-10-CM

## 2014-11-23 DIAGNOSIS — E119 Type 2 diabetes mellitus without complications: Secondary | ICD-10-CM | POA: Diagnosis present

## 2014-11-23 DIAGNOSIS — Z882 Allergy status to sulfonamides status: Secondary | ICD-10-CM

## 2014-11-23 DIAGNOSIS — R197 Diarrhea, unspecified: Secondary | ICD-10-CM | POA: Diagnosis present

## 2014-11-23 DIAGNOSIS — E1159 Type 2 diabetes mellitus with other circulatory complications: Secondary | ICD-10-CM

## 2014-11-23 DIAGNOSIS — E785 Hyperlipidemia, unspecified: Secondary | ICD-10-CM | POA: Diagnosis present

## 2014-11-23 DIAGNOSIS — I10 Essential (primary) hypertension: Secondary | ICD-10-CM | POA: Diagnosis present

## 2014-11-23 DIAGNOSIS — G4733 Obstructive sleep apnea (adult) (pediatric): Secondary | ICD-10-CM | POA: Diagnosis present

## 2014-11-23 DIAGNOSIS — J449 Chronic obstructive pulmonary disease, unspecified: Secondary | ICD-10-CM | POA: Diagnosis present

## 2014-11-23 DIAGNOSIS — K529 Noninfective gastroenteritis and colitis, unspecified: Secondary | ICD-10-CM | POA: Diagnosis present

## 2014-11-23 DIAGNOSIS — F17210 Nicotine dependence, cigarettes, uncomplicated: Secondary | ICD-10-CM | POA: Diagnosis present

## 2014-11-23 DIAGNOSIS — Z86718 Personal history of other venous thrombosis and embolism: Secondary | ICD-10-CM

## 2014-11-23 DIAGNOSIS — Z9851 Tubal ligation status: Secondary | ICD-10-CM

## 2014-11-23 DIAGNOSIS — Z6841 Body Mass Index (BMI) 40.0 and over, adult: Secondary | ICD-10-CM

## 2014-11-23 DIAGNOSIS — Z881 Allergy status to other antibiotic agents status: Secondary | ICD-10-CM

## 2014-11-23 DIAGNOSIS — R600 Localized edema: Secondary | ICD-10-CM | POA: Diagnosis present

## 2014-11-23 DIAGNOSIS — Z7901 Long term (current) use of anticoagulants: Secondary | ICD-10-CM

## 2014-11-23 HISTORY — DX: Acute embolism and thrombosis of unspecified deep veins of unspecified lower extremity: I82.409

## 2014-11-23 LAB — CBC WITH DIFFERENTIAL/PLATELET
BASOS ABS: 0.1 10*3/uL (ref 0.0–0.1)
BASOS PCT: 0.5 % (ref 0.0–3.0)
Basophils Absolute: 0 10*3/uL (ref 0.0–0.1)
Basophils Relative: 0 % (ref 0–1)
EOS ABS: 0 10*3/uL (ref 0.0–0.7)
Eosinophils Absolute: 0 10*3/uL (ref 0.0–0.7)
Eosinophils Relative: 0.2 % (ref 0.0–5.0)
Eosinophils Relative: 0 % (ref 0–5)
HCT: 46.6 % — ABNORMAL HIGH (ref 36.0–46.0)
HCT: 44.7 % (ref 36.0–46.0)
Hemoglobin: 15.4 g/dL — ABNORMAL HIGH (ref 12.0–15.0)
Hemoglobin: 14.5 g/dL (ref 12.0–15.0)
LYMPHS PCT: 11.9 % — AB (ref 12.0–46.0)
Lymphocytes Relative: 12 % (ref 12–46)
Lymphs Abs: 2.3 10*3/uL (ref 0.7–4.0)
Lymphs Abs: 2.3 10*3/uL (ref 0.7–4.0)
MCH: 29.2 pg (ref 26.0–34.0)
MCHC: 33.2 g/dL (ref 30.0–36.0)
MCHC: 32.4 g/dL (ref 30.0–36.0)
MCV: 88.7 fl (ref 78.0–100.0)
MCV: 90.1 fL (ref 78.0–100.0)
MONO ABS: 1.7 10*3/uL — AB (ref 0.1–1.0)
Monocytes Absolute: 1.8 10*3/uL — ABNORMAL HIGH (ref 0.1–1.0)
Monocytes Relative: 8.7 % (ref 3.0–12.0)
Monocytes Relative: 9 % (ref 3–12)
NEUTROS ABS: 15 10*3/uL — AB (ref 1.4–7.7)
NEUTROS PCT: 78.7 % — AB (ref 43.0–77.0)
Neutro Abs: 15.3 10*3/uL — ABNORMAL HIGH (ref 1.7–7.7)
Neutrophils Relative %: 79 % — ABNORMAL HIGH (ref 43–77)
Platelets: 243 10*3/uL (ref 150.0–400.0)
Platelets: 252 10*3/uL (ref 150–400)
RBC: 5.25 Mil/uL — AB (ref 3.87–5.11)
RBC: 4.96 MIL/uL (ref 3.87–5.11)
RDW: 15.3 % (ref 11.5–15.5)
RDW: 14.8 % (ref 11.5–15.5)
WBC: 19.1 10*3/uL (ref 4.0–10.5)
WBC: 19.4 10*3/uL — ABNORMAL HIGH (ref 4.0–10.5)

## 2014-11-23 LAB — COMPREHENSIVE METABOLIC PANEL
ALBUMIN: 3.2 g/dL — AB (ref 3.5–5.2)
ALT: 16 U/L (ref 0–35)
ALT: 18 U/L (ref 14–54)
AST: 15 U/L (ref 0–37)
AST: 16 U/L (ref 15–41)
Albumin: 3.1 g/dL — ABNORMAL LOW (ref 3.5–5.0)
Alkaline Phosphatase: 47 U/L (ref 39–117)
Alkaline Phosphatase: 48 U/L (ref 38–126)
Anion gap: 10 (ref 5–15)
BUN: 13 mg/dL (ref 6–23)
BUN: 18 mg/dL (ref 6–20)
CALCIUM: 8.7 mg/dL (ref 8.4–10.5)
CO2: 26 meq/L (ref 19–32)
CO2: 27 mmol/L (ref 22–32)
CREATININE: 0.67 mg/dL (ref 0.40–1.20)
Calcium: 8.6 mg/dL — ABNORMAL LOW (ref 8.9–10.3)
Chloride: 96 mEq/L (ref 96–112)
Chloride: 96 mmol/L — ABNORMAL LOW (ref 101–111)
Creatinine, Ser: 0.74 mg/dL (ref 0.44–1.00)
GFR calc Af Amer: >60 mL/min (ref >60)
GFR calc non Af Amer: >60 mL/min (ref >60)
GFR: 91.73 mL/min (ref >60.00)
GLUCOSE: 175 mg/dL — AB (ref 70–99)
Glucose, Bld: 203 mg/dL — ABNORMAL HIGH (ref 70–99)
Potassium: 3.9 mEq/L (ref 3.5–5.1)
Potassium: 4.3 mmol/L (ref 3.5–5.1)
SODIUM: 130 meq/L — AB (ref 135–145)
Sodium: 133 mmol/L — ABNORMAL LOW (ref 135–145)
Total Bilirubin: 0.5 mg/dL (ref 0.2–1.2)
Total Bilirubin: 0.8 mg/dL (ref 0.3–1.2)
Total Protein: 6.6 g/dL (ref 6.0–8.3)
Total Protein: 6.8 g/dL (ref 6.5–8.1)

## 2014-11-23 LAB — URINALYSIS, ROUTINE W REFLEX MICROSCOPIC
Glucose, UA: 100 mg/dL — AB
Ketones, ur: NEGATIVE mg/dL
Nitrite: NEGATIVE
Protein, ur: 100 mg/dL — AB
Specific Gravity, Urine: 1.03 (ref 1.005–1.030)
Urobilinogen, UA: 1 mg/dL (ref 0.0–1.0)
pH: 5.5 (ref 5.0–8.0)

## 2014-11-23 LAB — URINE MICROSCOPIC-ADD ON

## 2014-11-23 LAB — LIPASE, BLOOD: Lipase: 23 U/L (ref 22–51)

## 2014-11-23 LAB — I-STAT CG4 LACTIC ACID, ED: Lactic Acid, Venous: 1.02 mmol/L (ref 0.5–2.0)

## 2014-11-23 MED ORDER — SODIUM CHLORIDE 0.9 % IV BOLUS (SEPSIS)
500.0000 mL | Freq: Once | INTRAVENOUS | Status: AC
  Administered 2014-11-23: 500 mL via INTRAVENOUS

## 2014-11-23 MED ORDER — METRONIDAZOLE 500 MG PO TABS: 500.0000 mg | ORAL_TABLET | Freq: Three times a day (TID) | ORAL | Status: DC

## 2014-11-23 MED ORDER — IOHEXOL 300 MG/ML  SOLN
100.0000 mL | Freq: Once | INTRAMUSCULAR | Status: AC | PRN
  Administered 2014-11-23: 100 mL via INTRAVENOUS

## 2014-11-23 MED ORDER — IOHEXOL 300 MG/ML  SOLN
50.0000 mL | Freq: Once | INTRAMUSCULAR | Status: AC | PRN
  Administered 2014-11-23: 50 mL via ORAL

## 2014-11-23 MED ORDER — DIPHENOXYLATE-ATROPINE 2.5-0.025 MG PO TABS: 2.0000 | ORAL_TABLET | Freq: Four times a day (QID) | ORAL | Status: DC | PRN

## 2014-11-23 MED ORDER — ONDANSETRON 8 MG PO TBDP: 8.0000 mg | ORAL_TABLET | Freq: Three times a day (TID) | ORAL | Status: DC | PRN

## 2014-11-23 NOTE — ED Notes (Signed)
Nurse currently getting 1st set of blood cultures.

## 2014-11-23 NOTE — ED Notes (Signed)
Pt states that she has had diarrhea for 3 days; pt states that she has had 5 episodes of diarrhea today; pt states that she saw her PCP today and he advised to come to the ER for evaluation due to possibly needing fluids and pt states that her lab work "was out of whack" per her MD

## 2014-11-23 NOTE — Patient Instructions (Addendum)
Diarrhea About one week post antibiotic. Will go ahead and rx flagyl. Get and do stool studies.  You may have c dif vs bacterial cause of diarrhea based on clinical hx and description.  I want you to rest, hydrate, follow bland diet guidlines and take tylenol for fever.  Take lomotil rx for diarrhea. For nausea of vomiting, I am prescribing zofran.   There is some chance that your have a bacterial infection so I do want you to get stool panel kit and turn that in as soon as possible. Turning stool panel kit earlier will provide [name] with quicker result of studies and more informed decision if antibiotics are needed.  Please get labs today as well.  Please call and give me update on Wednesday. If worsening signs or symptoms then ED evaluation.     After getting labs back and seeing wbc at 19.1. Advised pt to go to ED for evaluation.

## 2014-11-23 NOTE — Progress Notes (Signed)
Pre visit review using our clinic review tool, if applicable. No additional management support is needed unless otherwise documented below in the visit note. 

## 2014-11-23 NOTE — Telephone Encounter (Signed)
Patient called back stating that she will go to [facility] ED tonight.

## 2014-11-23 NOTE — Assessment & Plan Note (Signed)
About one week post antibiotic. Will go ahead and rx flagyl. Get and do stool studies.  You may have c dif vs bacterial cause of diarrhea based on clinical hx and description.  I want you to rest, hydrate, follow bland diet guidlines and take tylenol for fever.  Take lomotil rx for diarrhea. For nausea of vomiting, I am prescribing zofran.   There is some chance that your have a bacterial infection so I do want you to get stool panel kit and turn that in as soon as possible. Turning stool panel kit earlier will provide [name] with quicker result of studies and more informed decision if antibiotics are needed.  Please get labs today as well.  Please call and give me update on Wednesday. If worsening signs or symptoms then ED evaluation.

## 2014-11-23 NOTE — Telephone Encounter (Signed)
With her wbc count 3 days of severe diarrhea and her overall clinical presentation, I called pt and advised to go to ED tonight. Pt expressed understanding/agreed.

## 2014-11-23 NOTE — Progress Notes (Signed)
Subjective:    Patient ID: Heidi Stark, female    DOB: August 18, 1940, 74 y.o.   MRN: [id number]  HPI  Pt in today reporting  diarrhea for 3  days. On review no  association with with any foods or meal that immediately preceded onset of diarrhea. Report contact with contact with no  persons with GI illness. Recent some antibiotics that she finished around first May but no diarrhea at the end of antibiotic course. About 1 wk between finishing anitbiotics and onset of diarrhea. Reports no history of an inflammatory bowel diseases. Reports approximately number of stools about 6-7 day. No nausea or vomiting. Stomach cramps. Pt has tried otc treatments maybe immodium otc. No bloody stools.   Pt had no preceding constipation.  Today number of loose stools are decreasing.     Review of Systems  Constitutional: Positive for chills and fatigue. Negative for fever.  Respiratory: Negative for cough, chest tightness, shortness of breath and wheezing.   Cardiovascular: Negative for palpitations.  Gastrointestinal: Positive for nausea, abdominal pain and diarrhea. Negative for vomiting, constipation, blood in stool, abdominal distention, anal bleeding and rectal pain.       Some sore abdomen from using bathroom.  Genitourinary: Negative for dysuria, frequency, decreased urine volume, vaginal pain and menstrual problem.  Musculoskeletal: Negative for back pain.  Neurological: Negative for dizziness, tremors, syncope, light-headedness and numbness.  Hematological: Negative for adenopathy. Does not bruise/bleed easily.   Past Medical History  Diagnosis Date  . COPD (chronic obstructive pulmonary disease)   . Hypertension   . Hyperlipidemia   . Diabetes mellitus     Type II    History   Social History  . Marital Status: Married    Spouse Name: N/A  . Number of Children: N/A  . Years of Education: N/A   Occupational History  . Not on file.   Social History Main Topics  . Smoking status:  Current Every Day Smoker    Last Attempt to Quit: 09/14/2005  . Smokeless tobacco: Never Used  . Alcohol Use: No  . Drug Use: No  . Sexual Activity: Not on file   Other Topics Concern  . Not on file   Social History Narrative    Past Surgical History  Procedure Laterality Date  . Tubal ligation    . Knee surgery      Right Knee cart.Removal  . Cystectomy      between bladder and kidneys  . Back surgery      vein graft and colon repair after nicked artery with back surgert  . Ganglion cyst excision    . Tee without cardioversion N/A 10/31/2013    Procedure: TRANSESOPHAGEAL ECHOCARDIOGRAM (TEE);  Surgeon: Larey Dresser, MD;  Location: [facility] ENDOSCOPY;  Service: Cardiovascular;  Laterality: N/A;    Family History  Problem Relation Age of Onset  . Diabetes    . Melanoma    . Factor V Leiden deficiency Daughter     Allergies  Allergen Reactions  . Sulfonamide Derivatives     Stomach cramps  . Neomycin-Bacitracin Zn-Polymyx Rash    Current Outpatient Prescriptions on File Prior to Visit  Medication Sig Dispense Refill  . apixaban (ELIQUIS) 5 MG TABS tablet Take 1 tablet (5 mg total) by mouth 2 (two) times daily. 60 tablet 0  . doxycycline (VIBRA-TABS) 100 MG tablet Take 1 tablet (100 mg total) by mouth 2 (two) times daily. 14 tablet 0  . fenofibrate 160 MG tablet Take 1  tablet (160 mg total) by mouth daily. 30 tablet 5  . Fluticasone-Salmeterol (ADVAIR) 250-50 MCG/DOSE AEPB Inhale 1 puff into the lungs every 12 (twelve) hours. As needed for shortness of breath 60 each 2  . furosemide (LASIX) 20 MG tablet Take 1 tablet (20 mg total) by mouth 2 (two) times daily. 180 tablet 1  . glimepiride (AMARYL) 2 MG tablet Take 2 mg by mouth daily with supper.     Marland Kitchen glucose blood test strip Check Blood Sugars four time daily 100 each 2  . insulin glargine (LANTUS) 100 UNIT/ML injection Inject 10 Units into the skin at bedtime. 10 units qhs x 30 days    . KAZANO 12.11-998 MG TABS TAKE 1  TABLET BY MOUTH TWICE A DAY 60 tablet 5  . lisinopril (PRINIVIL,ZESTRIL) 5 MG tablet Take 1 tablet (5 mg total) by mouth daily. 30 tablet 5  . loratadine (CLARITIN) 10 MG tablet Take 1 tablet (10 mg total) by mouth daily. 30 tablet 0  . metoprolol tartrate (LOPRESSOR) 25 MG tablet Take 0.5 tablets (12.5 mg total) by mouth 2 (two) times daily. 30 tablet 2  . potassium chloride (KLOR-CON 10) 10 MEQ tablet Take 1 tablet (10 mEq total) by mouth 2 (two) times daily. 180 tablet 1  . rosuvastatin (CRESTOR) 20 MG tablet Take 10 mg by mouth daily.      No current facility-administered medications on file prior to visit.    BP 128/79 mmHg  Pulse 85  Temp(Src) 100.2 F (37.9 C) (Oral)  Ht 5\' 5"  (1.651 m)  Wt 249 lb (112.946 kg)  BMI 41.44 kg/m2  SpO2 95%      Objective:   Physical Exam  General Appearance- Not in acute distress.  HEENT Eyes- Scleraeral/Conjuntiva-bilat- Not Yellow. Mouth & Throat- Normal.  Chest and Lung Exam Auscultation: Breath sounds:-Normal. Adventitious sounds:- No Adventitious sounds.  Cardiovascular Auscultation:Rythm - Regular. Heart Sounds -Normal heart sounds.  Abdomen Inspection:-Inspection Normal.  Palpation/Perucssion: Palpation and Percussion of the abdomen reveal- moderate Tender both lower quadrants, No Rebound tenderness, No rigidity(Guarding) and No Palpable abdominal masses.  Liver:-Normal.  Spleen:- Normal.   Skin- moist. No tenting.      Assessment & Plan:

## 2014-11-23 NOTE — ED Provider Notes (Signed)
CSN: [id number]     Arrival date & time 11/23/14  1833 History   First MD Initiated Contact with Patient 11/23/14 2150     Chief Complaint  Patient presents with  . Diarrhea     (Consider location/radiation/quality/duration/timing/severity/associated sxs/prior Treatment) Patient is a 74 y.o. female presenting with diarrhea. The history is provided by the patient. No language interpreter was used.  Diarrhea Associated symptoms: abdominal pain, chills and fever   Associated symptoms: no vomiting   Ms. Lindamood is a 74 y.o female with a history of COPD, HTN, hyperlipidemia, DM, and DVT who presents with fatigue that began on Friday and then several episodes of diarrhea since Saturday.  She has also had a low grade fever. She states that she went to her pcp Anabel Halon, PA-C, at [facility] today and had labs done.  They called her today and told her to come to the ED due to abnormal lab. Her last antibiotic use was doxycycline which she had finished 1 week prior to having diarrhea.  She denies any bloody stools, nausea, or vomiting.   Past Medical History  Diagnosis Date  . COPD (chronic obstructive pulmonary disease)   . Hypertension   . Hyperlipidemia   . Diabetes mellitus     Type II  . DVT (deep venous thrombosis)    Past Surgical History  Procedure Laterality Date  . Tubal ligation    . Knee surgery      Right Knee cart.Removal  . Cystectomy      between bladder and kidneys  . Back surgery      vein graft and colon repair after nicked artery with back surgert  . Ganglion cyst excision    . Tee without cardioversion N/A 10/31/2013    Procedure: TRANSESOPHAGEAL ECHOCARDIOGRAM (TEE);  Surgeon: Larey Dresser, MD;  Location: [facility] ENDOSCOPY;  Service: Cardiovascular;  Laterality: N/A;   Family History  Problem Relation Age of Onset  . Diabetes    . Melanoma    . Factor V Leiden deficiency Daughter    History  Substance Use Topics  . Smoking status: Current Every  Day Smoker -- 0.50 packs/day    Types: Cigarettes    Last Attempt to Quit: 09/14/2005  . Smokeless tobacco: Never Used  . Alcohol Use: 0.0 oz/week    0 Standard drinks or equivalent per week   OB History    No data available     Review of Systems  Constitutional: Positive for fever, chills and fatigue.  Gastrointestinal: Positive for abdominal pain and diarrhea. Negative for vomiting, blood in stool and abdominal distention.  Genitourinary: Negative for dysuria.  All other systems reviewed and are negative.     Allergies  Sulfonamide derivatives and Neomycin-bacitracin zn-polymyx  Home Medications   Prior to Admission medications   Medication Sig Start Date End Date Taking? Authorizing Provider  apixaban (ELIQUIS) 5 MG TABS tablet Take 1 tablet (5 mg total) by mouth 2 (two) times daily. 04/06/14  Yes Larey Dresser, MD  diphenoxylate-atropine (LOMOTIL) 2.5-0.025 MG per tablet Take 2 tablets by mouth 4 (four) times daily as needed for diarrhea or loose stools. 11/23/14  Yes Meriam Sprague Saguier, PA-C  doxycycline (VIBRA-TABS) 100 MG tablet Take 1 tablet (100 mg total) by mouth 2 (two) times daily. 10/29/14  Yes Meriam Sprague Saguier, PA-C  fenofibrate 160 MG tablet Take 1 tablet (160 mg total) by mouth daily. 09/25/14  Yes Rosalita Chessman, DO  Fluticasone-Salmeterol (ADVAIR) 250-50 MCG/DOSE  AEPB Inhale 1 puff into the lungs every 12 (twelve) hours. As needed for shortness of breath 05/14/14  Yes Yvonne R Lowne, DO  furosemide (LASIX) 20 MG tablet Take 1 tablet (20 mg total) by mouth 2 (two) times daily.   Yes [name] Lowne, DO  glimepiride (AMARYL) 2 MG tablet Take 2 mg by mouth daily with supper.    Yes Historical Provider, MD  insulin glargine (LANTUS) 100 UNIT/ML injection Inject 10 Units into the skin at bedtime. 10 units qhs x 30 days   Yes Bindubal Balan, MD  KAZANO 12.11-998 MG TABS TAKE 1 TABLET BY MOUTH TWICE A DAY 08/31/14  Yes Yvonne R Lowne, DO  lisinopril (PRINIVIL,ZESTRIL) 5 MG  tablet Take 1 tablet (5 mg total) by mouth daily. 09/25/14  Yes Yvonne R Lowne, DO  loratadine (CLARITIN) 10 MG tablet Take 1 tablet (10 mg total) by mouth daily. 10/29/14  Yes Meriam Sprague Saguier, PA-C  metoprolol tartrate (LOPRESSOR) 25 MG tablet Take 0.5 tablets (12.5 mg total) by mouth 2 (two) times daily. 09/25/14  Yes Yvonne R Lowne, DO  ondansetron (ZOFRAN ODT) 8 MG disintegrating tablet Take 1 tablet (8 mg total) by mouth every 8 (eight) hours as needed for nausea or vomiting. 11/23/14  Yes Meriam Sprague Saguier, PA-C  potassium chloride (KLOR-CON 10) 10 MEQ tablet Take 1 tablet (10 mEq total) by mouth 2 (two) times daily. 09/11/14  Yes Yvonne R Lowne, DO  rosuvastatin (CRESTOR) 20 MG tablet Take 10 mg by mouth daily.    Yes Historical Provider, MD  glucose blood test strip Check Blood Sugars four time daily 03/21/12   Rosalita Chessman, DO  metroNIDAZOLE (FLAGYL) 500 MG tablet Take 1 tablet (500 mg total) by mouth 3 (three) times daily. 11/23/14   Meriam Sprague Saguier, PA-C  metroNIDAZOLE (FLAGYL) 500 MG tablet Take 1 tablet (500 mg total) by mouth 3 (three) times daily. Patient not taking: Reported on 11/24/2014 11/23/14   Meriam Sprague Saguier, PA-C   BP 119/54 mmHg  Pulse 78  Temp(Src) 99.1 F (37.3 C) (Oral)  Resp 16  Ht 5\' 6"  (1.676 m)  Wt 249 lb (112.946 kg)  BMI 40.21 kg/m2  SpO2 96% Physical Exam  Constitutional: She is oriented to person, place, and time. She appears well-developed and well-nourished.  HENT:  Head: Normocephalic and atraumatic.  Eyes: Conjunctivae are normal.  Neck: Normal range of motion. Neck supple.  Cardiovascular: Normal rate, regular rhythm and normal heart sounds.   Pulmonary/Chest: Effort normal and breath sounds normal. No respiratory distress. She has no wheezes.  Abdominal: Soft. There is generalized tenderness. There is no guarding.  Obese abdomen.  Musculoskeletal: Normal range of motion.  Neurological: She is alert and oriented to person, place, and time.  Skin: Skin  is warm and dry.  Nursing note and vitals reviewed.   ED Course  Procedures (including critical care time) Labs Review Labs Reviewed  CBC WITH DIFFERENTIAL/PLATELET - Abnormal; Notable for the following:    WBC 19.4 (*)    Neutrophils Relative % 79 (*)    Neutro Abs 15.3 (*)    Monocytes Absolute 1.8 (*)    All other components within normal limits  COMPREHENSIVE METABOLIC PANEL - Abnormal; Notable for the following:    Sodium 133 (*)    Chloride 96 (*)    Glucose, Bld 203 (*)    Calcium 8.6 (*)    Albumin 3.1 (*)    All other components within normal limits  URINALYSIS,  ROUTINE W REFLEX MICROSCOPIC - Abnormal; Notable for the following:    Color, Urine ORANGE (*)    APPearance TURBID (*)    Glucose, UA 100 (*)    Hgb urine dipstick SMALL (*)    Bilirubin Urine SMALL (*)    Protein, ur 100 (*)    Leukocytes, UA LARGE (*)    All other components within normal limits  URINE MICROSCOPIC-ADD ON - Abnormal; Notable for the following:    Bacteria, UA FEW (*)    Casts HYALINE CASTS (*)    All other components within normal limits  CULTURE, BLOOD (ROUTINE X 2)  CULTURE, BLOOD (ROUTINE X 2)  CLOSTRIDIUM DIFFICILE BY PCR  LIPASE, BLOOD  I-STAT CG4 LACTIC ACID, ED  I-STAT CG4 LACTIC ACID, ED    Imaging Review Ct Abdomen Pelvis W Contrast  11/24/2014   CLINICAL DATA:  Diarrhea for 3 days. Abdominal pain. Primary care physician sent the patient to the ER.  EXAM: CT ABDOMEN AND PELVIS WITH CONTRAST  TECHNIQUE: Multidetector CT imaging of the abdomen and pelvis was performed using the standard protocol following bolus administration of intravenous contrast.  CONTRAST:  1mL OMNIPAQUE IOHEXOL 300 MG/ML SOLN, 18mL OMNIPAQUE IOHEXOL 300 MG/ML SOLN  COMPARISON:  10/28/2013  FINDINGS: Atelectasis in the lung bases. Cardiac enlargement. Calcification in the mitral valve annulus.  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 The liver, gallbladder,  pancreas, inferior vena cava, and retroperitoneal lymph nodes are unremarkable. Enlarged left adrenal gland similar to prior study, probably representing adenoma or hyperplasia. Small low-attenuation lesions in the kidneys too small to characterize but likely representing cysts. Focal areas of scarring in both kidneys. No hydronephrosis. Calcification of abdominal aorta without aneurysm. Stomach and small bowel are not abnormally distended. No free air or free fluid in the abdomen. Small right anterior abdominal wall hernia containing fat.  Pelvis: There is diffuse wall thickening demonstrated in the right colon and transverse colon. This is most consistent with inflammatory process such as colitis. Etiology is indeterminate in this could represent infectious or inflammatory process. Appendix is normal. Bladder wall is not thickened. Uterus and ovaries are not enlarged. No free or loculated pelvic fluid collections. No pelvic mass or lymphadenopathy. Degenerative changes in the spine. Degenerative changes in the hips.  IMPRESSION: Colonic wall thickening most likely to represent colitis of nonspecific etiology. No evidence of bowel obstruction. Scarring in the kidneys and spleen.   Electronically Signed   By: Lucienne Capers M.D.   On: 11/24/2014 00:12     EKG Interpretation None      MDM   Final diagnoses:  Diarrhea  Colitis  Elevated WBC count  Acute UTI  Patient presents for diarrhea x 3 days and abdominal pain.  She was seen by pcp today and told to come to ED since she had WBC of 19.  She had a temp of 100.2 earlier today.   She is stable and in no distress.  Her vitals are not concerning.  I have started fluids.  She has a UTI and CT shows nonspecific colitis.  I ordered c.diff which we were unable to collect in the ED. She will need admission and has been started on cipro and flagyl which should treat both colitis and UTI.   12:32 I spoke to Dr. Barbaraann Faster and admitted the patient to med surg.       Ottie Glazier, PA-C 11/24/14 0155  Virgel Manifold, MD 11/25/14 1348

## 2014-11-23 NOTE — ED Notes (Signed)
Patient made aware a urine specimen is needed.

## 2014-11-24 ENCOUNTER — Encounter (HOSPITAL_COMMUNITY): Payer: Self-pay | Admitting: Family Medicine

## 2014-11-24 DIAGNOSIS — Z6841 Body Mass Index (BMI) 40.0 and over, adult: Secondary | ICD-10-CM | POA: Diagnosis not present

## 2014-11-24 DIAGNOSIS — E119 Type 2 diabetes mellitus without complications: Secondary | ICD-10-CM | POA: Diagnosis present

## 2014-11-24 DIAGNOSIS — Z882 Allergy status to sulfonamides status: Secondary | ICD-10-CM | POA: Diagnosis not present

## 2014-11-24 DIAGNOSIS — K529 Noninfective gastroenteritis and colitis, unspecified: Secondary | ICD-10-CM | POA: Diagnosis not present

## 2014-11-24 DIAGNOSIS — F17210 Nicotine dependence, cigarettes, uncomplicated: Secondary | ICD-10-CM | POA: Diagnosis present

## 2014-11-24 DIAGNOSIS — G4733 Obstructive sleep apnea (adult) (pediatric): Secondary | ICD-10-CM | POA: Diagnosis present

## 2014-11-24 DIAGNOSIS — J449 Chronic obstructive pulmonary disease, unspecified: Secondary | ICD-10-CM | POA: Diagnosis present

## 2014-11-24 DIAGNOSIS — R600 Localized edema: Secondary | ICD-10-CM | POA: Diagnosis present

## 2014-11-24 DIAGNOSIS — E785 Hyperlipidemia, unspecified: Secondary | ICD-10-CM

## 2014-11-24 DIAGNOSIS — J42 Unspecified chronic bronchitis: Secondary | ICD-10-CM

## 2014-11-24 DIAGNOSIS — Z9851 Tubal ligation status: Secondary | ICD-10-CM | POA: Diagnosis not present

## 2014-11-24 DIAGNOSIS — J438 Other emphysema: Secondary | ICD-10-CM | POA: Diagnosis not present

## 2014-11-24 DIAGNOSIS — N390 Urinary tract infection, site not specified: Secondary | ICD-10-CM | POA: Diagnosis present

## 2014-11-24 DIAGNOSIS — Z7901 Long term (current) use of anticoagulants: Secondary | ICD-10-CM | POA: Diagnosis not present

## 2014-11-24 DIAGNOSIS — I10 Essential (primary) hypertension: Secondary | ICD-10-CM | POA: Diagnosis present

## 2014-11-24 DIAGNOSIS — R197 Diarrhea, unspecified: Secondary | ICD-10-CM | POA: Diagnosis present

## 2014-11-24 DIAGNOSIS — A09 Infectious gastroenteritis and colitis, unspecified: Secondary | ICD-10-CM | POA: Diagnosis present

## 2014-11-24 DIAGNOSIS — I480 Paroxysmal atrial fibrillation: Secondary | ICD-10-CM

## 2014-11-24 DIAGNOSIS — Z86718 Personal history of other venous thrombosis and embolism: Secondary | ICD-10-CM | POA: Diagnosis not present

## 2014-11-24 DIAGNOSIS — Z881 Allergy status to other antibiotic agents status: Secondary | ICD-10-CM | POA: Diagnosis not present

## 2014-11-24 DIAGNOSIS — Z794 Long term (current) use of insulin: Secondary | ICD-10-CM | POA: Diagnosis not present

## 2014-11-24 LAB — BASIC METABOLIC PANEL
ANION GAP: 11 (ref 5–15)
BUN: 15 mg/dL (ref 6–20)
CHLORIDE: 97 mmol/L — AB (ref 101–111)
CO2: 24 mmol/L (ref 22–32)
Calcium: 8 mg/dL — ABNORMAL LOW (ref 8.9–10.3)
Creatinine, Ser: 0.6 mg/dL (ref 0.44–1.00)
GFR calc non Af Amer: >60 mL/min (ref >60)
Glucose, Bld: 187 mg/dL — ABNORMAL HIGH (ref 70–99)
POTASSIUM: 3.6 mmol/L (ref 3.5–5.1)
Sodium: 132 mmol/L — ABNORMAL LOW (ref 135–145)

## 2014-11-24 LAB — CBC
HCT: 43.6 % (ref 36.0–46.0)
Hemoglobin: 14.5 g/dL (ref 12.0–15.0)
MCH: 29.8 pg (ref 26.0–34.0)
MCHC: 33.3 g/dL (ref 30.0–36.0)
MCV: 89.7 fL (ref 78.0–100.0)
Platelets: 238 10*3/uL (ref 150–400)
RBC: 4.86 MIL/uL (ref 3.87–5.11)
RDW: 14.7 % (ref 11.5–15.5)
WBC: 18.8 10*3/uL — ABNORMAL HIGH (ref 4.0–10.5)

## 2014-11-24 LAB — GLUCOSE, CAPILLARY
GLUCOSE-CAPILLARY: 199 mg/dL — AB (ref 70–99)
Glucose-Capillary: 180 mg/dL — ABNORMAL HIGH (ref 70–99)
Glucose-Capillary: 182 mg/dL — ABNORMAL HIGH (ref 70–99)
Glucose-Capillary: 157 mg/dL — ABNORMAL HIGH (ref 70–99)

## 2014-11-24 LAB — CG4 I-STAT (LACTIC ACID): Lactic Acid, Venous: 1.14 mmol/L (ref 0.5–2.0)

## 2014-11-24 LAB — CLOSTRIDIUM DIFFICILE BY PCR: Toxigenic C. Difficile by PCR: NEGATIVE

## 2014-11-24 MED ORDER — ONDANSETRON HCL 4 MG/2ML IJ SOLN
4.0000 mg | Freq: Four times a day (QID) | INTRAMUSCULAR | Status: DC | PRN
  Filled 2014-11-24: qty 2

## 2014-11-24 MED ORDER — ROSUVASTATIN CALCIUM 10 MG PO TABS
10.0000 mg | ORAL_TABLET | Freq: Every day | ORAL | Status: DC
  Administered 2014-11-24 – 2014-11-25 (×2): 10 mg via ORAL
  Filled 2014-11-24 (×3): qty 1

## 2014-11-24 MED ORDER — ACETAMINOPHEN 325 MG PO TABS: 650.0000 mg | ORAL_TABLET | Freq: Four times a day (QID) | ORAL | Status: DC | PRN

## 2014-11-24 MED ORDER — ACETAMINOPHEN 650 MG RE SUPP: 650.0000 mg | Freq: Four times a day (QID) | RECTAL | Status: DC | PRN

## 2014-11-24 MED ORDER — MORPHINE SULFATE 2 MG/ML IJ SOLN
1.0000 mg | INTRAMUSCULAR | Status: DC | PRN
  Administered 2014-11-24 – 2014-11-25 (×3): 1 mg via INTRAVENOUS
  Filled 2014-11-24 (×3): qty 1

## 2014-11-24 MED ORDER — POTASSIUM CHLORIDE ER 10 MEQ PO TBCR
10.0000 meq | EXTENDED_RELEASE_TABLET | Freq: Two times a day (BID) | ORAL | Status: DC
  Administered 2014-11-24 – 2014-11-26 (×6): 10 meq via ORAL
  Filled 2014-11-24 (×7): qty 1

## 2014-11-24 MED ORDER — METRONIDAZOLE IN NACL 5-0.79 MG/ML-% IV SOLN
500.0000 mg | Freq: Three times a day (TID) | INTRAVENOUS | Status: DC
  Administered 2014-11-24 – 2014-11-26 (×6): 500 mg via INTRAVENOUS
  Filled 2014-11-24 (×8): qty 100

## 2014-11-24 MED ORDER — ONDANSETRON HCL 4 MG PO TABS: 4.0000 mg | ORAL_TABLET | Freq: Four times a day (QID) | ORAL | Status: DC | PRN

## 2014-11-24 MED ORDER — SODIUM CHLORIDE 0.9 % IV SOLN
INTRAVENOUS | Status: DC
  Administered 2014-11-24: 03:00:00 via INTRAVENOUS

## 2014-11-24 MED ORDER — APIXABAN 5 MG PO TABS
5.0000 mg | ORAL_TABLET | Freq: Two times a day (BID) | ORAL | Status: DC
  Administered 2014-11-24 – 2014-11-26 (×6): 5 mg via ORAL
  Filled 2014-11-24 (×7): qty 1

## 2014-11-24 MED ORDER — LISINOPRIL 5 MG PO TABS
5.0000 mg | ORAL_TABLET | Freq: Every day | ORAL | Status: DC
  Administered 2014-11-24 – 2014-11-26 (×3): 5 mg via ORAL
  Filled 2014-11-24 (×3): qty 1

## 2014-11-24 MED ORDER — CIPROFLOXACIN IN D5W 400 MG/200ML IV SOLN
400.0000 mg | Freq: Two times a day (BID) | INTRAVENOUS | Status: DC
  Administered 2014-11-24 – 2014-11-25 (×4): 400 mg via INTRAVENOUS
  Filled 2014-11-24 (×6): qty 200

## 2014-11-24 MED ORDER — FUROSEMIDE 20 MG PO TABS
20.0000 mg | ORAL_TABLET | Freq: Two times a day (BID) | ORAL | Status: DC
  Administered 2014-11-24 – 2014-11-26 (×4): 20 mg via ORAL
  Filled 2014-11-24 (×7): qty 1

## 2014-11-24 MED ORDER — METRONIDAZOLE IN NACL 5-0.79 MG/ML-% IV SOLN
500.0000 mg | Freq: Once | INTRAVENOUS | Status: DC
  Filled 2014-11-24 (×2): qty 100

## 2014-11-24 MED ORDER — METOPROLOL TARTRATE 12.5 MG HALF TABLET
12.5000 mg | ORAL_TABLET | Freq: Two times a day (BID) | ORAL | Status: DC
  Administered 2014-11-24 – 2014-11-26 (×6): 12.5 mg via ORAL
  Filled 2014-11-24 (×7): qty 1

## 2014-11-24 MED ORDER — INSULIN ASPART 100 UNIT/ML ~~LOC~~ SOLN
0.0000 [IU] | Freq: Three times a day (TID) | SUBCUTANEOUS | Status: DC
  Administered 2014-11-24 – 2014-11-25 (×3): 2 [IU] via SUBCUTANEOUS
  Administered 2014-11-25: 1 [IU] via SUBCUTANEOUS
  Administered 2014-11-25: 2 [IU] via SUBCUTANEOUS

## 2014-11-24 MED ORDER — MOMETASONE FURO-FORMOTEROL FUM 100-5 MCG/ACT IN AERO
2.0000 | INHALATION_SPRAY | Freq: Two times a day (BID) | RESPIRATORY_TRACT | Status: DC
  Filled 2014-11-24: qty 8.8

## 2014-11-24 NOTE — Progress Notes (Signed)
Patient seen and evaluated earlier this am by my associate. Please refer to H and P for details regarding assessment and plan.  PE General: pt in nad, alert and awake CV: no cyanosis Abdomen: obese, no guarding  Will continue current antibiotic regimen for treatment of colitis and reassess next am  Jacquez Sheetz, Linward Foster

## 2014-11-24 NOTE — Progress Notes (Signed)
Patient placed on full face CPAP auto-titrate 5-20cm h20 with 2L of O2 bled in. Patient tolerated well and RT will continue to monitor.

## 2014-11-24 NOTE — H&P (Signed)
[facility] History and Physical  [name] RJJ:[id number] DOB: July 05, 1941 DOA: 11/23/2014  Referring physician: PA - [place] PCP: Garnet Koyanagi, DO   Chief Complaint: diarrhea  HPI: Heidi Stark is a 74 y.o. female   Watery diarrhea x3 days. Occasional red streaking. Intermittent. Getting worse. Associated w/ subjective fevers. wentto PCP today and told to come to ED after noting abnormal labs. Course of Doxycycline completed 1 week prior to onset of symptoms. Decreased PO during this time. Nothing is made patient's symptoms better or worse.  Review of Systems:  Constitutional:  No weight loss, night sweats,  chills.  HEENT:  No headaches, Difficulty swallowing,Tooth/dental problems,Sore throat,  No sneezing, itching, ear ache, nasal congestion, post nasal drip,  Cardio-vascular:  No chest pain, Orthopnea, PND, swelling in lower extremities, anasarca, dizziness, palpitations  GI: Per HPI Resp:   No shortness of breath with exertion or at rest. No excess mucus, no productive cough, No non-productive cough, No coughing up of blood.No change in color of mucus.No wheezing.No chest wall deformity  Skin:  no rash or lesions.  GU:  no dysuria, change in color of urine, no urgency or frequency. No flank pain.  Musculoskeletal:   No joint pain or swelling. No decreased range of motion. No back pain.  Psych:  No change in mood or affect. No depression or anxiety. No memory loss.   Past Medical History  Diagnosis Date  . COPD (chronic obstructive pulmonary disease)   . Hypertension   . Hyperlipidemia   . Diabetes mellitus     Type II  . DVT (deep venous thrombosis)   . PAF (paroxysmal atrial fibrillation)    Past Surgical History  Procedure Laterality Date  . Tubal ligation    . Knee surgery      Right Knee cart.Removal  . Cystectomy      between bladder and kidneys  . Back surgery      vein graft and colon repair after nicked artery with back surgert    . Ganglion cyst excision    . Tee without cardioversion N/A 10/31/2013    Procedure: TRANSESOPHAGEAL ECHOCARDIOGRAM (TEE);  Surgeon: Larey Dresser, MD;  Location: [place];  Service: Cardiovascular;  Laterality: N/A;   Social History:  reports that she has been smoking Cigarettes.  She has been smoking about 0.50 packs per day. She has never used smokeless tobacco. She reports that she drinks alcohol. She reports that she does not use illicit drugs.  Allergies  Allergen Reactions  . Sulfonamide Derivatives     Stomach cramps  . Neomycin-Bacitracin Zn-Polymyx Rash    Family History  Problem Relation Age of Onset  . Diabetes    . Melanoma    . Factor V Leiden deficiency Daughter      Prior to Admission medications   Medication Sig Start Date End Date Taking? Authorizing Provider  apixaban (ELIQUIS) 5 MG TABS tablet Take 1 tablet (5 mg total) by mouth 2 (two) times daily. 04/06/14   Larey Dresser, MD  diphenoxylate-atropine (LOMOTIL) 2.5-0.025 MG per tablet Take 2 tablets by mouth 4 (four) times daily as needed for diarrhea or loose stools. 11/23/14   Meriam Sprague Saguier, PA-C  doxycycline (VIBRA-TABS) 100 MG tablet Take 1 tablet (100 mg total) by mouth 2 (two) times daily. 10/29/14   Pettus, PA-C  fenofibrate 160 MG tablet Take 1 tablet (160 mg total) by mouth daily. 09/25/14   Rosalita Chessman, DO  Fluticasone-Salmeterol (ADVAIR) 250-50 MCG/DOSE AEPB Inhale 1 puff into the lungs every 12 (twelve) hours. As needed for shortness of breath 05/14/14   Rosalita Chessman, DO  furosemide (LASIX) 20 MG tablet Take 1 tablet (20 mg total) by mouth 2 (two) times daily.    [name] Lowne, DO  glimepiride (AMARYL) 2 MG tablet Take 2 mg by mouth daily with supper.     Historical Provider, MD  glucose blood test strip Check Blood Sugars four time daily 03/21/12   Rosalita Chessman, DO  insulin glargine (LANTUS) 100 UNIT/ML injection Inject 10 Units into the skin at bedtime. 10 units qhs x 30 days     Bindubal Balan, MD  KAZANO 12.11-998 MG TABS TAKE 1 TABLET BY MOUTH TWICE A DAY 08/31/14   [name] Lowne, DO  lisinopril (PRINIVIL,ZESTRIL) 5 MG tablet Take 1 tablet (5 mg total) by mouth daily. 09/25/14   Rosalita Chessman, DO  loratadine (CLARITIN) 10 MG tablet Take 1 tablet (10 mg total) by mouth daily. 10/29/14   [employer], PA-C  metoprolol tartrate (LOPRESSOR) 25 MG tablet Take 0.5 tablets (12.5 mg total) by mouth 2 (two) times daily. 09/25/14   Rosalita Chessman, DO  metroNIDAZOLE (FLAGYL) 500 MG tablet Take 1 tablet (500 mg total) by mouth 3 (three) times daily. 11/23/14   Meriam Sprague Saguier, PA-C  metroNIDAZOLE (FLAGYL) 500 MG tablet Take 1 tablet (500 mg total) by mouth 3 (three) times daily. 11/23/14   Meriam Sprague Saguier, PA-C  ondansetron (ZOFRAN ODT) 8 MG disintegrating tablet Take 1 tablet (8 mg total) by mouth every 8 (eight) hours as needed for nausea or vomiting. 11/23/14   Meriam Sprague Saguier, PA-C  potassium chloride (KLOR-CON 10) 10 MEQ tablet Take 1 tablet (10 mEq total) by mouth 2 (two) times daily. 09/11/14   Rosalita Chessman, DO  rosuvastatin (CRESTOR) 20 MG tablet Take 10 mg by mouth daily.     Historical Provider, MD   Physical Exam: Filed Vitals:   11/23/14 1908 11/23/14 1930 11/23/14 2152 11/24/14 0220  BP: 122/78 129/61 119/54 148/58  Pulse: 78 86 78 83  Temp: 98.7 F (37.1 C) 99.1 F (37.3 C)  98.9 F (37.2 C)  TempSrc: Oral Oral  Oral  Resp: 19 18 16 18   Height:  5\' 6"  (1.676 m)    Weight:  112.946 kg (249 lb)    SpO2: 96% 98% 96% 95%    Wt Readings from Last 3 Encounters:  11/23/14 112.946 kg (249 lb)  11/23/14 112.946 kg (249 lb)  11/05/14 115.304 kg (254 lb 3.2 oz)    General:  Appears calm and comfortable Eyes:  PERRL, normal lids, irises & conjunctiva ENT:  grossly normal hearing, lips & tongue Neck:  no LAD, masses or thyromegaly Cardiovascular:  RRR, no m/r/g. 1+ LE edema Telemetry:  SR, no arrhythmias  Respiratory:  CTA bilaterally, no w/r/r. Normal  respiratory effort. Abdomen: Soft, normoactive bowel sounds, mild tenderness to palpation in the right lower quadrant and suprapubic region. Skin:  no rash or induration seen on limited exam Musculoskeletal:  grossly normal tone BUE/BLE Psychiatric:  grossly normal mood and affect, speech fluent and appropriate Neurologic:  grossly non-focal.          Labs on Admission:  Basic Metabolic Panel:  Recent Labs Lab 11/23/14 1410 11/23/14 2036  NA 130* 133*  K 3.9 4.3  CL 96 96*  CO2 26 27  GLUCOSE 175* 203*  BUN 13 18  CREATININE 0.67 0.74  CALCIUM 8.7 8.6*   Liver Function Tests:  Recent Labs Lab 11/23/14 1410 11/23/14 2036  AST 15 16  ALT 16 18  ALKPHOS 47 48  BILITOT 0.5 0.8  PROT 6.6 6.8  ALBUMIN 3.2* 3.1*    Recent Labs Lab 11/23/14 2036  LIPASE 23   No results for input(s): AMMONIA in the last 168 hours. CBC:  Recent Labs Lab 11/23/14 1410 11/23/14 2036  WBC 19.1 Repeated and verified X2.* 19.4*  NEUTROABS 15.0* 15.3*  HGB 15.4* 14.5  HCT 46.6* 44.7  MCV 88.7 90.1  PLT 243.0 252   Cardiac Enzymes: No results for input(s): CKTOTAL, CKMB, CKMBINDEX, TROPONINI in the last 168 hours.  BNP (last 3 results) No results for input(s): BNP in the last 8760 hours.  ProBNP (last 3 results) No results for input(s): PROBNP in the last 8760 hours.  CBG: No results for input(s): GLUCAP in the last 168 hours.  Radiological Exams on Admission: Ct Abdomen Pelvis W Contrast  11/24/2014   CLINICAL DATA:  Diarrhea for 3 days. Abdominal pain. Primary care physician sent the patient to the ER.  EXAM: CT ABDOMEN AND PELVIS WITH CONTRAST  TECHNIQUE: Multidetector CT imaging of the abdomen and pelvis was performed using the standard protocol following bolus administration of intravenous contrast.  CONTRAST:  48mL OMNIPAQUE IOHEXOL 300 MG/ML SOLN, 142mL OMNIPAQUE IOHEXOL 300 MG/ML SOLN  COMPARISON:  10/28/2013  FINDINGS: Atelectasis in the lung bases. Cardiac enlargement.  Calcification in the mitral valve annulus.  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 The liver, gallbladder, pancreas, inferior vena cava, and retroperitoneal lymph nodes are unremarkable. Enlarged left adrenal gland similar to prior study, probably representing adenoma or hyperplasia. Small low-attenuation lesions in the kidneys too small to characterize but likely representing cysts. Focal areas of scarring in both kidneys. No hydronephrosis. Calcification of abdominal aorta without aneurysm. Stomach and small bowel are not abnormally distended. No free air or free fluid in the abdomen. Small right anterior abdominal wall hernia containing fat.  Pelvis: There is diffuse wall thickening demonstrated in the right colon and transverse colon. This is most consistent with inflammatory process such as colitis. Etiology is indeterminate in this could represent infectious or inflammatory process. Appendix is normal. Bladder wall is not thickened. Uterus and ovaries are not enlarged. No free or loculated pelvic fluid collections. No pelvic mass or lymphadenopathy. Degenerative changes in the spine. Degenerative changes in the hips.  IMPRESSION: Colonic wall thickening most likely to represent colitis of nonspecific etiology. No evidence of bowel obstruction. Scarring in the kidneys and spleen.   Electronically Signed   By: Lucienne Capers M.D.   On: 11/24/2014 00:12      Assessment/Plan Active Problems:   Type II diabetes mellitus, well controlled   HLD (hyperlipidemia)   Essential hypertension   COPD (chronic obstructive pulmonary disease)   PAF (paroxysmal atrial fibrillation)   Diarrhea   Colitis   UTI (urinary tract infection)  Colitis: CT with evidence of colitis. Patient recently on antibiotics and with multiple day history of diarrhea. WBC 19.4. Temp 100.2. Lactic acid 1.02.  - MedSurg - Cipro/Metronidazole - Follow-up C. Difficile - Nothing by  mouth - IVF  UTI: UA consistent with UTI. Suprapubic tenderness on physical exam. No CVA tenderness. - Cipro - Urine culture  Diabetes: Last A1c of 7.3 on 06/23/2014 - Hold home [place] - Hold Lantus until taking by mouth - SSI  COPD: No  sign of acute exacerbation - Continue Advair - CPAP at night  PAF:  - Continue Eliquis  HLD: - Continue Crestor  Lower extremity edema: - Continue her Lasix  Hypertension: - Continue lisinopril, metoprolol  Code Status: FULL DVT Prophylaxis: Heparin Family Communication: Daughter Disposition Plan: pending improvement  MERRELL, DAVID J, MD Family Medicine [facility] [email or web address] Password [email or web address]

## 2014-11-24 NOTE — Progress Notes (Signed)
Initial Nutrition Assessment  DOCUMENTATION CODES:  Morbid obesity  INTERVENTION:  Other (Comment) (Encourage PO once diet advances)  NUTRITION DIAGNOSIS:  Inadequate oral intake related to inability to eat as evidenced by NPO status.  GOAL:  Patient will meet greater than or equal to 90% of their needs  MONITOR:  Diet advancement, Labs, Weight trends, I & O's, PO intake  REASON FOR ASSESSMENT:  Malnutrition Screening Tool    ASSESSMENT: Pt w/watery diarrhea x3 days. Occasional red streaking. Intermittent. Getting worse. Associated w/ subjective fevers. Went to PCP and was told to come to ED after noting abnormal labs. Course of Doxycycline completed 1 week prior to onset of symptoms. Decreased PO during this time.  Pt's daughter at bedside at time of visit. Pt states her appetite has be poor since last Friday. Notably, pt lost 19 Lb since February (7%, not significant for time frame), however, it was intentional to help control diabetes.  Pt was open to some diet education, but would rather have the handouts to look over when she feels better at home. Provided pt with "Weight Loss Tips" and "Carbohydrate counting for people with Diabetes" handouts by the Academy of [employer] Dietetics. Walked briefly pt and her daughter through the handouts, used MYPlate method to emphasize healthy eating pattern. Questions were answered. Teach back method used, expect good compliance.  Will continue to monitor.   Labs reviewed: Na 132, Ca 8.0, Glu 180   Height:  Ht Readings from Last 1 Encounters:  11/23/14 5\' 6"  (1.676 m)    Weight:  Wt Readings from Last 1 Encounters:  11/23/14 249 lb (112.946 kg)    Ideal Body Weight:  59 kg  Wt Readings from Last 10 Encounters:  11/23/14 249 lb (112.946 kg)  11/23/14 249 lb (112.946 kg)  11/05/14 254 lb 3.2 oz (115.304 kg)  10/29/14 255 lb 9.6 oz (115.939 kg)  09/09/14 268 lb (121.564 kg)  03/09/14 252 lb (114.306 kg)  02/26/14 253  lb 9.6 oz (115.032 kg)  02/12/14 251 lb (113.853 kg)  01/15/14 249 lb (112.946 kg)  01/15/14 248 lb (112.492 kg)    BMI:  Body mass index is 40.21 kg/(m^2).  Estimated Nutritional Needs:  Kcal:  1500 - 1700  Protein:  70 - 80 g  Fluid:  per MD  Skin:  Reviewed, no issues  Diet Order:  Diet NPO time specified  EDUCATION NEEDS:  Education needs addressed   Intake/Output Summary (Last 24 hours) at 11/24/14 0950 Last data filed at 11/24/14 0440  Gross per 24 hour  Intake      0 ml  Output    200 ml  Net   -200 ml    Last BM:  PTA  Calyb Mcquarrie A. [place] Dietetic Intern Pager: [phone] 11/24/2014 9:52 AM

## 2014-11-24 NOTE — Progress Notes (Signed)
ANTIBIOTIC CONSULT NOTE - INITIAL  Pharmacy Consult for Cipro Indication: Colitis/UTI  Allergies  Allergen Reactions  . Sulfonamide Derivatives     Stomach cramps  . Neomycin-Bacitracin Zn-Polymyx Rash    Patient Measurements: Height: 5\' 6"  (167.6 cm) Weight: 249 lb (112.946 kg) IBW/kg (Calculated) : 59.3   Vital Signs: Temp: 99.1 F (37.3 C) (05/09 1930) Temp Source: Oral (05/09 1930) BP: 119/54 mmHg (05/09 2152) Pulse Rate: 78 (05/09 2152) Intake/Output from previous day:   Intake/Output from this shift:    Labs:  Recent Labs  11/23/14 1410 11/23/14 2036  WBC 19.1 Repeated and verified X2.* 19.4*  HGB 15.4* 14.5  PLT 243.0 252  CREATININE 0.67 0.74   Estimated Creatinine Clearance: 81 mL/min (by C-G formula based on Cr of 0.74). No results for input(s): VANCOTROUGH, VANCOPEAK, VANCORANDOM, GENTTROUGH, GENTPEAK, GENTRANDOM, TOBRATROUGH, TOBRAPEAK, TOBRARND, AMIKACINPEAK, AMIKACINTROU, AMIKACIN in the last 72 hours.   Microbiology: No results found for this or any previous visit (from the past 720 hour(s)).  Medical History: Past Medical History  Diagnosis Date  . COPD (chronic obstructive pulmonary disease)   . Hypertension   . Hyperlipidemia   . Diabetes mellitus     Type II  . DVT (deep venous thrombosis)     Medications:   (Not in a hospital admission) Scheduled:   Infusions:  . ciprofloxacin    . metronidazole     Assessment: 61 yoF c/o diarrhea.  Cipro per Rx for Colitis and UTI.  Goal of Therapy:  Treat infection  Plan:   Cipro 400mg  IV q12h  F/u SCr/cultures  Lawana Pai R 11/24/2014,12:38 AM

## 2014-11-25 ENCOUNTER — Other Ambulatory Visit: Payer: Self-pay

## 2014-11-25 DIAGNOSIS — J438 Other emphysema: Secondary | ICD-10-CM

## 2014-11-25 LAB — GLUCOSE, CAPILLARY
GLUCOSE-CAPILLARY: 168 mg/dL — AB (ref 70–99)
Glucose-Capillary: 192 mg/dL — ABNORMAL HIGH (ref 70–99)
Glucose-Capillary: 127 mg/dL — ABNORMAL HIGH (ref 70–99)

## 2014-11-25 LAB — HEMOGLOBIN A1C
Hgb A1c MFr Bld: 7.5 % — ABNORMAL HIGH (ref 4.8–5.6)
Mean Plasma Glucose: 169 mg/dL

## 2014-11-25 LAB — URINE CULTURE
Colony Count: NO GROWTH
Culture: NO GROWTH

## 2014-11-25 LAB — CBC
HEMATOCRIT: 38.8 % (ref 36.0–46.0)
Hemoglobin: 12.4 g/dL (ref 12.0–15.0)
MCH: 28.8 pg (ref 26.0–34.0)
MCHC: 32 g/dL (ref 30.0–36.0)
MCV: 90 fL (ref 78.0–100.0)
Platelets: 197 10*3/uL (ref 150–400)
RBC: 4.31 MIL/uL (ref 3.87–5.11)
RDW: 14.6 % (ref 11.5–15.5)
WBC: 17.5 10*3/uL — ABNORMAL HIGH (ref 4.0–10.5)

## 2014-11-25 MED ORDER — INSULIN GLARGINE 100 UNIT/ML ~~LOC~~ SOLN
8.0000 [IU] | Freq: Every day | SUBCUTANEOUS | Status: DC
  Administered 2014-11-25: 8 [IU] via SUBCUTANEOUS
  Filled 2014-11-25: qty 0.08

## 2014-11-25 NOTE — Progress Notes (Signed)
Pt hospitalized. She is getting meds there.

## 2014-11-25 NOTE — Progress Notes (Signed)
Patient Demographics  Heidi Stark, is a 74 y.o. female, DOB - [birth year], WVP:[id number]  Admit date - 11/23/2014   Admitting Physician Waldemar Dickens, MD  Outpatient Primary MD for the patient is Garnet Koyanagi, DO  LOS - 1   Chief Complaint  Patient presents with  . Diarrhea        Subjective:   Heidi Stark today has, No headache, No chest pain, No abdominal pain - No Nausea, No new weakness tingling or numbness, No Cough - SOB. Diarrhea has improved.    Assessment & Plan    1. Colitis. ? for infectious, C. difficile negative, improving with Cipro Flagyl, abdominal exam benign. CT scan noted. Continue Cipro Flagyl, IV fluids, advance to full liquid diet, monitor. Will need outpatient GI follow-up.   2. UTI. On Cipro. Monitor culture results.   3. DM type II. On sliding scale here, will add low-dose Lantus as we are increasing her diet.  CBG (last 3)   Recent Labs  11/24/14 1628 11/24/14 2129 11/25/14 0739  GLUCAP 182* 157* 168*    Lab Results  Component Value Date   HGBA1C 7.5* 11/24/2014     4. History of COPD. No acute issues, supportive care.   5.OSA - on C Pap at night continue.   6.PAF [place] VASC score - 4 - on beta blocker and Eliquis continue.   7. Essential hypertension. On Lasix, lisinopril and Lopressor continue.   8. Dyslipidemia. Continue on statin.   Code Status: Full  Family Communication: Daughter  Disposition Plan: Home  Consults  None  Procedures    CT Abd Pelvis - colitis  DVT Prophylaxis  Eliquis  Lab Results  Component Value Date   PLT 197 11/25/2014   Medications  Scheduled Meds: . apixaban  5 mg Oral BID  . ciprofloxacin  400 mg Intravenous Q12H  . furosemide  20 mg Oral BID  . insulin aspart  0-9 Units Subcutaneous TID  WC  . lisinopril  5 mg Oral Daily  . metoprolol tartrate  12.5 mg Oral BID  . metronidazole  500 mg Intravenous Once  . metronidazole  500 mg Intravenous Q8H  . mometasone-formoterol  2 puff Inhalation BID  . potassium chloride  10 mEq Oral BID  . rosuvastatin  10 mg Oral Daily   Continuous Infusions:  PRN Meds:.acetaminophen **OR** acetaminophen, morphine injection, ondansetron **OR** ondansetron (ZOFRAN) IV  Antibiotics     Anti-infectives    Start     Dose/Rate Route Frequency Ordered Stop   11/24/14 1000  metroNIDAZOLE (FLAGYL) IVPB 500 mg     500 mg 100 mL/hr over 60 Minutes Intravenous Every 8 hours 11/24/14 0245     11/24/14 0030  metroNIDAZOLE (FLAGYL) IVPB 500 mg     500 mg 100 mL/hr over 60 Minutes Intravenous  Once 11/24/14 0020     11/24/14 0030  ciprofloxacin (CIPRO) IVPB 400 mg     400 mg 200 mL/hr over 60 Minutes Intravenous Every 12 hours 11/24/14 0028          Objective:   Filed Vitals:   11/24/14 1512 11/24/14 2130 11/24/14 2228 11/25/14 0556  BP: 130/58 103/59  129/59  Pulse: 78 86 85 80  Temp: 98.3 F (36.8  C) 98.4 F (36.9 C)  98.4 F (36.9 C)  TempSrc: Oral Oral  Oral  Resp: 18 16 18 20   Height:      Weight:      SpO2: 94% 97% 96% 95%    Wt Readings from Last 3 Encounters:  11/23/14 112.946 kg (249 lb)  11/23/14 112.946 kg (249 lb)  11/05/14 115.304 kg (254 lb 3.2 oz)     Intake/Output Summary (Last 24 hours) at 11/25/14 0947 Last data filed at 11/24/14 1739  Gross per 24 hour  Intake    240 ml  Output      0 ml  Net    240 ml     Physical Exam  Awake Alert, Oriented X 3, No new F.N deficits, Normal affect [place].AT,PERRAL Supple Neck,No JVD, No cervical lymphadenopathy appriciated.  Symmetrical Chest wall movement, Good air movement bilaterally, CTAB RRR,No Gallops,Rubs or new Murmurs, No Parasternal Heave +ve B.Sounds, Abd Soft, No tenderness, No organomegaly appriciated, No rebound - guarding or rigidity. No Cyanosis, Clubbing  or edema, No new Rash or bruise     Data Review   Micro Results Recent Results (from the past 240 hour(s))  Blood culture (routine x 2)     Status: None (Preliminary result)   Collection Time: 11/23/14 10:22 PM  Result Value Ref Range Status   Specimen Description BLOOD R FOREARM  Final   Special Requests BOTTLES DRAWN AEROBIC AND ANAEROBIC 5ML  Final   Culture   Final           BLOOD CULTURE RECEIVED NO GROWTH TO DATE CULTURE WILL BE HELD FOR 5 DAYS BEFORE ISSUING A FINAL NEGATIVE REPORT Performed at [facility]    Report Status PENDING  Incomplete  Blood culture (routine x 2)     Status: None (Preliminary result)   Collection Time: 11/23/14 11:25 PM  Result Value Ref Range Status   Specimen Description BLOOD L ARM  Final   Special Requests BOTTLES DRAWN AEROBIC AND ANAEROBIC 5ML  Final   Culture   Final           BLOOD CULTURE RECEIVED NO GROWTH TO DATE CULTURE WILL BE HELD FOR 5 DAYS BEFORE ISSUING A FINAL NEGATIVE REPORT Performed at [facility]    Report Status PENDING  Incomplete  Clostridium Difficile by PCR     Status: None   Collection Time: 11/24/14  1:33 AM  Result Value Ref Range Status   C difficile by pcr NEGATIVE NEGATIVE Final    Radiology Reports Ct Abdomen Pelvis W Contrast  11/24/2014   CLINICAL DATA:  Diarrhea for 3 days. Abdominal pain. Primary care physician sent the patient to the ER.  EXAM: CT ABDOMEN AND PELVIS WITH CONTRAST  TECHNIQUE: Multidetector CT imaging of the abdomen and pelvis was performed using the standard protocol following bolus administration of intravenous contrast.  CONTRAST:  46mL OMNIPAQUE IOHEXOL 300 MG/ML SOLN, 148mL OMNIPAQUE IOHEXOL 300 MG/ML SOLN  COMPARISON:  10/28/2013  FINDINGS: Atelectasis in the lung bases. Cardiac enlargement. Calcification in the mitral valve annulus.  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 The liver, gallbladder, pancreas, inferior vena cava,  and retroperitoneal lymph nodes are unremarkable. Enlarged left adrenal gland similar to prior study, probably representing adenoma or hyperplasia. Small low-attenuation lesions in the kidneys too small to characterize but likely representing cysts. Focal areas of scarring in both kidneys. No hydronephrosis. Calcification of abdominal aorta without aneurysm. Stomach and small  bowel are not abnormally distended. No free air or free fluid in the abdomen. Small right anterior abdominal wall hernia containing fat.  Pelvis: There is diffuse wall thickening demonstrated in the right colon and transverse colon. This is most consistent with inflammatory process such as colitis. Etiology is indeterminate in this could represent infectious or inflammatory process. Appendix is normal. Bladder wall is not thickened. Uterus and ovaries are not enlarged. No free or loculated pelvic fluid collections. No pelvic mass or lymphadenopathy. Degenerative changes in the spine. Degenerative changes in the hips.  IMPRESSION: Colonic wall thickening most likely to represent colitis of nonspecific etiology. No evidence of bowel obstruction. Scarring in the kidneys and spleen.   Electronically Signed   By: Lucienne Capers M.D.   On: 11/24/2014 00:12     CBC  Recent Labs Lab 11/23/14 1410 11/23/14 2036 11/24/14 0540 11/25/14 0001  WBC 19.1 Repeated and verified X2.* 19.4* 18.8* 17.5*  HGB 15.4* 14.5 14.5 12.4  HCT 46.6* 44.7 43.6 38.8  PLT 243.0 252 238 197  MCV 88.7 90.1 89.7 90.0  MCH  --  29.2 29.8 28.8  MCHC 33.2 32.4 33.3 32.0  RDW 15.3 14.8 14.7 14.6  LYMPHSABS 2.3 2.3  --   --   MONOABS 1.7* 1.8*  --   --   EOSABS 0.0 0.0  --   --   BASOSABS 0.1 0.0  --   --     Chemistries   Recent Labs Lab 11/23/14 1410 11/23/14 2036 11/24/14 0540  NA 130* 133* 132*  K 3.9 4.3 3.6  CL 96 96* 97*  CO2 26 27 24   GLUCOSE 175* 203* 187*  BUN 13 18 15   CREATININE 0.67 0.74 0.60  CALCIUM 8.7 8.6* 8.0*  AST 15 16  --    ALT 16 18  --   ALKPHOS 47 48  --   BILITOT 0.5 0.8  --    ------------------------------------------------------------------------------------------------------------------ estimated creatinine clearance is 81 mL/min (by C-G formula based on Cr of 0.6). ------------------------------------------------------------------------------------------------------------------  Recent Labs  11/24/14 0540  HGBA1C 7.5*   ------------------------------------------------------------------------------------------------------------------ No results for input(s): CHOL, HDL, LDLCALC, TRIG, CHOLHDL, LDLDIRECT in the last 72 hours. ------------------------------------------------------------------------------------------------------------------ No results for input(s): TSH, T4TOTAL, T3FREE, THYROIDAB in the last 72 hours.  Invalid input(s): FREET3 ------------------------------------------------------------------------------------------------------------------ No results for input(s): VITAMINB12, FOLATE, FERRITIN, TIBC, IRON, RETICCTPCT in the last 72 hours.  Coagulation profile No results for input(s): INR, PROTIME in the last 168 hours.  No results for input(s): DDIMER in the last 72 hours.  Cardiac Enzymes No results for input(s): CKMB, TROPONINI, MYOGLOBIN in the last 168 hours.  Invalid input(s): CK ------------------------------------------------------------------------------------------------------------------ Invalid input(s): POCBNP   Time Spent in minutes   35   Lala Lund K M.D on 11/25/2014 at 9:47 AM  Between 7am to 7pm - Pager - [phone]  After 7pm go to [email or web address] - password [facility]  [facility]   Office  [phone]

## 2014-11-25 NOTE — Telephone Encounter (Signed)
Patient hospitalized

## 2014-11-26 LAB — GLUCOSE, CAPILLARY: GLUCOSE-CAPILLARY: 272 mg/dL — AB (ref 65–99)

## 2014-11-26 MED ORDER — CEFTRIAXONE SODIUM IN DEXTROSE 20 MG/ML IV SOLN
1.0000 g | INTRAVENOUS | Status: DC
  Administered 2014-11-26: 1 g via INTRAVENOUS
  Filled 2014-11-26: qty 50

## 2014-11-26 MED ORDER — METRONIDAZOLE 500 MG PO TABS: 500.0000 mg | ORAL_TABLET | Freq: Three times a day (TID) | ORAL | Status: DC

## 2014-11-26 MED ORDER — CIPROFLOXACIN HCL 500 MG PO TABS: 500.0000 mg | ORAL_TABLET | Freq: Two times a day (BID) | ORAL | Status: DC

## 2014-11-26 NOTE — Discharge Summary (Signed)
Heidi Stark, is a 74 y.o. female  DOB 1941-05-19  MRN [id number].  Admission date:  11/23/2014  Admitting Physician  Waldemar Dickens, MD  Discharge Date:  11/26/2014   Primary MD  Garnet Koyanagi, DO  Recommendations for primary care physician for things to follow:   Check CBC, CMP in 1 week. Outpatient follow-up with GI.   Admission Diagnosis  Diarrhea [R19.7] Colitis [K52.9] Elevated WBC count [D72.829] Acute UTI [N39.0]   Discharge Diagnosis  Diarrhea [R19.7] Colitis [K52.9] Elevated WBC count [D72.829] Acute UTI [N39.0]    Active Problems:   Type II diabetes mellitus, well controlled   HLD (hyperlipidemia)   Essential hypertension   COPD (chronic obstructive pulmonary disease)   PAF (paroxysmal atrial fibrillation)   Diarrhea   Colitis   UTI (urinary tract infection)   Acute UTI      Past Medical History  Diagnosis Date  . COPD (chronic obstructive pulmonary disease)   . Hypertension   . Hyperlipidemia   . Diabetes mellitus     Type II  . DVT (deep venous thrombosis)   . PAF (paroxysmal atrial fibrillation)     Past Surgical History  Procedure Laterality Date  . Tubal ligation    . Knee surgery      Right Knee cart.Removal  . Cystectomy      between bladder and kidneys  . Back surgery      vein graft and colon repair after nicked artery with back surgert  . Ganglion cyst excision    . Tee without cardioversion N/A 10/31/2013    Procedure: TRANSESOPHAGEAL ECHOCARDIOGRAM (TEE);  Surgeon: Larey Dresser, MD;  Location: [facility];  Service: Cardiovascular;  Laterality: N/A;       History of present illness and  Hospital Course:     Kindly see H&P for history of present illness and admission details, please review complete Labs, Consult reports and Test reports for all details in  brief  HPI  from the history and physical done on the day of admission  74 year old female with history of COPD, type 2 diabetes mellitus, DVT in remote past, paroxysmal atrial fibrillation, see Pap use due to sleep apnea, dyslipidemia, hypertension who was admitted to the hospital with 3 day history of watery diarrhea and abdominal cramping, CT scan of abdomen and pelvis was addressed above colitis, treated with Cipro Flagyl along with supportive care with resolution of her symptoms. No diarrhea since last night. Tolerating diet and symptom free good to go home.   Hospital Course    1. Colitis. ? for infectious, C. difficile negative, likely infectious colitis which improved with Cipro Flagyl, abdominal exam benign. CT scan noted which is suggestive of colitis. She is symptom-free will place on 5 more days of oral Cipro Flagyl. Will need outpatient GI follow-up were possible colonoscopy in the future.   2. UTI. On Cipro. Urine culture is negative thus far.   3. DM type II. Continue Home regimen upon discharge. A1c was 7.5. Follow with PCP for  glycemic control.  CBG (last 3)   Recent Labs (last 2 labs)      Recent Labs  11/24/14 1628 11/24/14 2129 11/25/14 0739  GLUCAP 182* 157* 168*         Recent Labs    Lab Results  Component Value Date   HGBA1C 7.5* 11/24/2014       4. History of COPD. No acute issues, supportive care.   5.OSA - on C Pap at night continue.   6.PAF [place] VASC score - 4 - on beta blocker and Eliquis continue.   7. Essential hypertension. On Lasix, lisinopril and Lopressor continue.   8. Dyslipidemia. Continue on statin          Discharge Condition: Stable   Follow UP  Follow-up Information    Follow up with Garnet Koyanagi, DO. Schedule an appointment as soon as possible for a visit in 1 week.   Specialty:  Family Medicine   Contact information:   [place] [address] [phone]         Follow up with Silvano Rusk, MD. Schedule an appointment as soon as possible for a visit in 1 week.   Specialty:  Gastroenterology   Why:  colitis   Contact information:   [address] [place] [address] [phone]         Discharge Instructions  and  Discharge Medications      Discharge Instructions    Discharge instructions    Complete by:  As directed   Follow with Primary MD Garnet Koyanagi, DO in 7 days   Get CBC, CMP, 2 view Chest X ray checked  by Primary MD next visit.    Activity: As tolerated with Full fall precautions use walker/cane & assistance as needed   Disposition Home     Diet: Heart Healthy Low Carb  For Heart failure patients - Check your Weight same time everyday, if you gain over 2 pounds, or you develop in leg swelling, experience more shortness of breath or chest pain, call your Primary MD immediately. Follow Cardiac Low Salt Diet and 1.5 lit/day fluid restriction.   On your next visit with your primary care physician please Get Medicines reviewed and adjusted.   Please request your Prim.MD to go over all Hospital Tests and Procedure/Radiological results at the follow up, please get all Hospital records sent to your Prim MD by signing hospital release before you go home.   If you experience worsening of your admission symptoms, develop shortness of breath, life threatening emergency, suicidal or homicidal thoughts you must seek medical attention immediately by calling 911 or calling your MD immediately  if symptoms less severe.  You Must read complete instructions/literature along with all the possible adverse reactions/side effects for all the Medicines you take and that have been prescribed to you. Take any new Medicines after you have completely understood and accpet all the possible adverse reactions/side effects.   Do not drive, operating heavy machinery, perform activities at heights, swimming or participation in water activities  or provide baby sitting services if your were admitted for syncope or siezures until you have seen by Primary MD or a Neurologist and advised to do so again.  Do not drive when taking Pain medications.    Do not take more than prescribed Pain, Sleep and Anxiety Medications  Special Instructions: If you have smoked or chewed Tobacco  in the last 2 yrs please stop smoking, stop any regular Alcohol  and or any Recreational drug use.  Wear Seat belts while driving.   Please note  You were cared for by a hospitalist during your hospital stay. If you have any questions about your discharge medications or the care you received while you were in the hospital after you are discharged, you can call the unit and asked to speak with the hospitalist on call if the hospitalist that took care of you is not available. Once you are discharged, your primary care physician will handle any further medical issues. Please note that NO REFILLS for any discharge medications will be authorized once you are discharged, as it is imperative that you return to your primary care physician (or establish a relationship with a primary care physician if you do not have one) for your aftercare needs so that they can reassess your need for medications and monitor your lab values.     Increase activity slowly    Complete by:  As directed             Medication List    TAKE these medications        apixaban 5 MG Tabs tablet  Commonly known as:  ELIQUIS  Take 1 tablet (5 mg total) by mouth 2 (two) times daily.     ciprofloxacin 500 MG tablet  Commonly known as:  CIPRO  Take 1 tablet (500 mg total) by mouth 2 (two) times daily.     diphenoxylate-atropine 2.5-0.025 MG per tablet  Commonly known as:  LOMOTIL  Take 2 tablets by mouth 4 (four) times daily as needed for diarrhea or loose stools.     fenofibrate 160 MG tablet  Take 1 tablet (160 mg total) by mouth daily.     Fluticasone-Salmeterol 250-50 MCG/DOSE Aepb    Commonly known as:  ADVAIR  Inhale 1 puff into the lungs every 12 (twelve) hours. As needed for shortness of breath     furosemide 20 MG tablet  Commonly known as:  LASIX  Take 1 tablet (20 mg total) by mouth 2 (two) times daily.     glimepiride 2 MG tablet  Commonly known as:  AMARYL  Take 2 mg by mouth daily with supper.     glucose blood test strip  Check Blood Sugars four time daily     insulin glargine 100 UNIT/ML injection  Commonly known as:  LANTUS  Inject 10 Units into the skin at bedtime. 10 units qhs x 30 days     KAZANO 12.11-998 MG Tabs  Generic drug:  Alogliptin-Metformin HCl  TAKE 1 TABLET BY MOUTH TWICE A DAY     lisinopril 5 MG tablet  Commonly known as:  PRINIVIL,ZESTRIL  Take 1 tablet (5 mg total) by mouth daily.     loratadine 10 MG tablet  Commonly known as:  CLARITIN  Take 1 tablet (10 mg total) by mouth daily.     metoprolol tartrate 25 MG tablet  Commonly known as:  LOPRESSOR  Take 0.5 tablets (12.5 mg total) by mouth 2 (two) times daily.     metroNIDAZOLE 500 MG tablet  Commonly known as:  FLAGYL  Take 1 tablet (500 mg total) by mouth 3 (three) times daily.     ondansetron 8 MG disintegrating tablet  Commonly known as:  ZOFRAN ODT  Take 1 tablet (8 mg total) by mouth every 8 (eight) hours as needed for nausea or vomiting.     potassium chloride 10 MEQ tablet  Commonly known as:  KLOR-CON 10  Take 1 tablet (10 mEq total) by mouth 2 (two) times daily.     rosuvastatin 20 MG tablet  Commonly known as:  CRESTOR  Take 10 mg by mouth daily.          Diet and Activity recommendation: See Discharge Instructions above   Consults obtained - None   Major procedures and Radiology Reports - PLEASE review detailed and final reports for all details, in brief -       Ct Abdomen Pelvis W Contrast  11/24/2014   CLINICAL DATA:  Diarrhea for 3 days. Abdominal pain. Primary care physician sent the patient to the ER.  EXAM: CT ABDOMEN AND PELVIS  WITH CONTRAST  TECHNIQUE: Multidetector CT imaging of the abdomen and pelvis was performed using the standard protocol following bolus administration of intravenous contrast.  CONTRAST:  52mL OMNIPAQUE IOHEXOL 300 MG/ML SOLN, 180mL OMNIPAQUE IOHEXOL 300 MG/ML SOLN  COMPARISON:  10/28/2013  FINDINGS: Atelectasis in the lung bases. Cardiac enlargement. Calcification in the mitral valve annulus.  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 The liver, gallbladder, pancreas, inferior vena cava, and retroperitoneal lymph nodes are unremarkable. Enlarged left adrenal gland similar to prior study, probably representing adenoma or hyperplasia. Small low-attenuation lesions in the kidneys too small to characterize but likely representing cysts. Focal areas of scarring in both kidneys. No hydronephrosis. Calcification of abdominal aorta without aneurysm. Stomach and small bowel are not abnormally distended. No free air or free fluid in the abdomen. Small right anterior abdominal wall hernia containing fat.  Pelvis: There is diffuse wall thickening demonstrated in the right colon and transverse colon. This is most consistent with inflammatory process such as colitis. Etiology is indeterminate in this could represent infectious or inflammatory process. Appendix is normal. Bladder wall is not thickened. Uterus and ovaries are not enlarged. No free or loculated pelvic fluid collections. No pelvic mass or lymphadenopathy. Degenerative changes in the spine. Degenerative changes in the hips.  IMPRESSION: Colonic wall thickening most likely to represent colitis of nonspecific etiology. No evidence of bowel obstruction. Scarring in the kidneys and spleen.   Electronically Signed   By: Lucienne Capers M.D.   On: 11/24/2014 00:12    Micro Results      Recent Results (from the past 240 hour(s))  Blood culture (routine x 2)     Status: None (Preliminary result)   Collection Time: 11/23/14  10:22 PM  Result Value Ref Range Status   Specimen Description BLOOD R FOREARM  Final   Special Requests BOTTLES DRAWN AEROBIC AND ANAEROBIC 5ML  Final   Culture   Final           BLOOD CULTURE RECEIVED NO GROWTH TO DATE CULTURE WILL BE HELD FOR 5 DAYS BEFORE ISSUING A FINAL NEGATIVE REPORT Performed at [facility]    Report Status PENDING  Incomplete  Blood culture (routine x 2)     Status: None (Preliminary result)   Collection Time: 11/23/14 11:25 PM  Result Value Ref Range Status   Specimen Description BLOOD L ARM  Final   Special Requests BOTTLES DRAWN AEROBIC AND ANAEROBIC 5ML  Final   Culture   Final           BLOOD CULTURE RECEIVED NO GROWTH TO DATE CULTURE WILL BE HELD FOR 5 DAYS BEFORE ISSUING A FINAL NEGATIVE REPORT Performed at [facility]    Report Status PENDING  Incomplete  Clostridium Difficile by PCR  Status: None   Collection Time: 11/24/14  1:33 AM  Result Value Ref Range Status   C difficile by pcr NEGATIVE NEGATIVE Final  Culture, Urine     Status: None   Collection Time: 11/24/14  4:40 AM  Result Value Ref Range Status   Specimen Description URINE, RANDOM  Final   Special Requests NONE  Final   Colony Count NO GROWTH Performed at [facility]   Final   Culture NO GROWTH Performed at [facility]   Final   Report Status 11/25/2014 FINAL  Final       Today   Subjective:   Heidi Stark today has no headache,no chest abdominal pain,no new weakness tingling or numbness, feels much better wants to go home today.    Objective:   Blood pressure 138/47, pulse 60, temperature 98 F (36.7 C), temperature source Oral, resp. rate 20, height 5\' 6"  (1.676 m), weight 112.946 kg (249 lb), SpO2 99 %.   Intake/Output Summary (Last 24 hours) at 11/26/14 0901 Last data filed at 11/26/14 0300  Gross per 24 hour  Intake   1540 ml  Output      0 ml  Net   1540 ml    Exam Awake Alert, Oriented x 3, No new F.N deficits,  Normal affect [place].AT,PERRAL Supple Neck,No JVD, No cervical lymphadenopathy appriciated.  Symmetrical Chest wall movement, Good air movement bilaterally, CTAB RRR,No Gallops,Rubs or new Murmurs, No Parasternal Heave +ve B.Sounds, Abd Soft, Non tender, No organomegaly appriciated, No rebound -guarding or rigidity. No Cyanosis, Clubbing or edema, No new Rash or bruise  Data Review   CBC w Diff: Lab Results  Component Value Date   WBC 17.5* 11/25/2014   HGB 12.4 11/25/2014   HCT 38.8 11/25/2014   PLT 197 11/25/2014   LYMPHOPCT 12 11/23/2014   MONOPCT 9 11/23/2014   EOSPCT 0 11/23/2014   BASOPCT 0 11/23/2014    CMP: Lab Results  Component Value Date   NA 132* 11/24/2014   NA 142 06/23/2014   K 3.6 11/24/2014   CL 97* 11/24/2014   CO2 24 11/24/2014   BUN 15 11/24/2014   BUN 18 06/23/2014   CREATININE 0.60 11/24/2014   CREATININE 0.7 06/23/2014   GLU 208 06/23/2014   PROT 6.8 11/23/2014   ALBUMIN 3.1* 11/23/2014   BILITOT 0.8 11/23/2014   ALKPHOS 48 11/23/2014   AST 16 11/23/2014   ALT 18 11/23/2014  .   Total Time in preparing paper work, data evaluation and todays exam - 35 minutes  Thurnell Lose M.D on 11/26/2014 at 9:01 AM  [facility]   Office  [phone]

## 2014-11-26 NOTE — Discharge Instructions (Signed)
Follow with Primary MD Garnet Koyanagi, DO in 7 days   Get CBC, CMP, 2 view Chest X ray checked  by Primary MD next visit.    Activity: As tolerated with Full fall precautions use walker/cane & assistance as needed   Disposition Home     Diet: Heart Healthy Low Carb  For Heart failure patients - Check your Weight same time everyday, if you gain over 2 pounds, or you develop in leg swelling, experience more shortness of breath or chest pain, call your Primary MD immediately. Follow Cardiac Low Salt Diet and 1.5 lit/day fluid restriction.   On your next visit with your primary care physician please Get Medicines reviewed and adjusted.   Please request your Prim.MD to go over all Hospital Tests and Procedure/Radiological results at the follow up, please get all Hospital records sent to your Prim MD by signing hospital release before you go home.   If you experience worsening of your admission symptoms, develop shortness of breath, life threatening emergency, suicidal or homicidal thoughts you must seek medical attention immediately by calling 911 or calling your MD immediately  if symptoms less severe.  You Must read complete instructions/literature along with all the possible adverse reactions/side effects for all the Medicines you take and that have been prescribed to you. Take any new Medicines after you have completely understood and accpet all the possible adverse reactions/side effects.   Do not drive, operating heavy machinery, perform activities at heights, swimming or participation in water activities or provide baby sitting services if your were admitted for syncope or siezures until you have seen by Primary MD or a Neurologist and advised to do so again.  Do not drive when taking Pain medications.    Do not take more than prescribed Pain, Sleep and Anxiety Medications  Special Instructions: If you have smoked or chewed Tobacco  in the last 2 yrs please stop smoking, stop any  regular Alcohol  and or any Recreational drug use.  Wear Seat belts while driving.   Please note  You were cared for by a hospitalist during your hospital stay. If you have any questions about your discharge medications or the care you received while you were in the hospital after you are discharged, you can call the unit and asked to speak with the hospitalist on call if the hospitalist that took care of you is not available. Once you are discharged, your primary care physician will handle any further medical issues. Please note that NO REFILLS for any discharge medications will be authorized once you are discharged, as it is imperative that you return to your primary care physician (or establish a relationship with a primary care physician if you do not have one) for your aftercare needs so that they can reassess your need for medications and monitor your lab values.

## 2014-11-26 NOTE — Evaluation (Signed)
Physical Therapy Evaluation Patient Details Name: Heidi Stark MRN: [id number] DOB: 12/04/1940 Today's Date: 11/26/2014   History of Present Illness  74 yo female admitted with colitis.  Hx of COPD, HTN, DVT, DM  Clinical Impression  On eval, pt was Min guard assist for mobility-able to ambulate ~150 feet with 1 hand support of IV pole or hallway handrail. Pt reports feeling unsteady at times and preferred to have 1 point of contact for support. Do not feel pt has any follow up PT needs at this time. Encouraged pt to slowly increase activity once home and to have husband assist as needed especially with ADLS (getting in/out of shower). Instructed pt to consider straight cane use if mobility does not return to baseline in a few days or so.     Follow Up Recommendations No PT follow up;Supervision for mobility/OOB    Equipment Recommendations   (encouraged pt to consider cane use if she doesn't return to baseline in a few days or so)    Recommendations for Other Services       Precautions / Restrictions Precautions Precautions: Fall Restrictions Weight Bearing Restrictions: No      Mobility  Bed Mobility               General bed mobility comments: pt oob in recliner  Transfers Overall transfer level: Needs assistance   Transfers: Sit to/from Stand Sit to Stand: Supervision         General transfer comment: for safety  Ambulation/Gait Ambulation/Gait assistance: Min guard Ambulation Distance (Feet): 150 Feet Assistive device: None (IV pole or handrail) Gait Pattern/deviations: Step-through pattern;Decreased stride length     General Gait Details: close guard for safety. slow gait speed. Walked a short distance ~25 feet without UE support. Pt states she felt unsteady and was more comfortable with having support of handrail or IV pole  Stairs            Wheelchair Mobility    Modified Rankin (Stroke Patients Only)       Balance Overall balance  assessment: Needs assistance         Standing balance support: No upper extremity supported;During functional activity Standing balance-Leahy Scale: Good Standing balance comment: had pt peform EO/EC static standing, withstanding of perturbations, 360 degree turn, pick up of object. Noted increased time to complete tasks and pt reliant on 1 hand support when picking up object.  No LOB with tasks. Noted increased swaying with EC(pt states she has had issues with this for over 20 years)                             Pertinent Vitals/Pain Pain Assessment: No/denies pain    Home Living Family/patient expects to be discharged to:: Private residence Living Arrangements: Spouse/significant other Available Help at Discharge: Family Type of Home: House Home Access: Stairs to enter   Technical brewer of Steps: 2 Home Layout: One level Home Equipment: None      Prior Function Level of Independence: Independent               Hand Dominance        Extremity/Trunk Assessment   Upper Extremity Assessment: Overall WFL for tasks assessed           Lower Extremity Assessment: Generalized weakness      Cervical / Trunk Assessment: Normal  Communication   Communication: No difficulties  Cognition Arousal/Alertness: Awake/alert Behavior During Therapy: [facility]  for tasks assessed/performed Overall Cognitive Status: Within Functional Limits for tasks assessed                      General Comments      Exercises        Assessment/Plan    PT Assessment Patent does not need any further PT services  PT Diagnosis Difficulty walking;Generalized weakness   PT Problem List    PT Treatment Interventions     PT Goals (Current goals can be found in the Care Plan section) Acute Rehab PT Goals Patient Stated Goal: home PT Goal Formulation: All assessment and education complete, DC therapy    Frequency     Barriers to discharge        Co-evaluation                End of Session   Activity Tolerance: Patient tolerated treatment well Patient left: in chair;with call bell/phone within reach;with family/visitor present           Time: 0915-0925 PT Time Calculation (min) (ACUTE ONLY): 10 min   Charges:   PT Evaluation $Initial PT Evaluation Tier I: 1 Procedure     PT G Codes:        Weston Anna, MPT Pager: [phone]

## 2014-11-27 ENCOUNTER — Telehealth: Payer: Self-pay | Admitting: *Deleted

## 2014-11-27 NOTE — Telephone Encounter (Addendum)
Transition Care Management Follow-up Telephone Call  How have you been since you were released from the hospital? Patient states she is not having any abdominal pain, frequency, or burning.  She is taking the antibiotics as scheduled.  She has not had any more episodes of diarrhea since being home, but has only been home one day.      Do you understand why you were in the hospital? YES- patient states due to urinary tract infection and to get IV antibiotics   Do you understand the discharge instrcutions?  YES   Items Reviewed:  Medications reviewed: YES   Allergies reviewed:  YES   Dietary changes reviewed:  YES- low carb, diabetic diet   Referrals reviewed: YES, patient states she is to schedule follow-up with GI also and plans to do so today    Functional Questionnaire:   Activities of Daily Living (ADLs):   She states they are independent in the following: all  States they require assistance with the following:  None    Any transportation issues/concerns?: NO   Any patient concerns? YES- patient states that her urine sample was never sent at the hospital, so she does not know if the antibiotics are even going to work for the UTI   Confirmed importance and date/time of follow-up visits scheduled: YES- scheduled with Dr. [name] 11/30/14 at 45: 30   Confirmed with patient if condition begins to worsen call PCP or go to the ER.  Patient was given the Call-a-Nurse line [phone]: YES

## 2014-11-30 ENCOUNTER — Ambulatory Visit (INDEPENDENT_AMBULATORY_CARE_PROVIDER_SITE_OTHER): Payer: Medicare Other | Admitting: Family Medicine

## 2014-11-30 ENCOUNTER — Encounter: Payer: Self-pay | Admitting: Family Medicine

## 2014-11-30 VITALS — BP 128/74 | HR 88 | Temp 98.8°F | Resp 18 | Wt 254.0 lb

## 2014-11-30 DIAGNOSIS — I10 Essential (primary) hypertension: Secondary | ICD-10-CM

## 2014-11-30 DIAGNOSIS — Z87440 Personal history of urinary (tract) infections: Secondary | ICD-10-CM

## 2014-11-30 DIAGNOSIS — E11610 Type 2 diabetes mellitus with diabetic neuropathic arthropathy: Secondary | ICD-10-CM

## 2014-11-30 DIAGNOSIS — E119 Type 2 diabetes mellitus without complications: Secondary | ICD-10-CM

## 2014-11-30 DIAGNOSIS — E785 Hyperlipidemia, unspecified: Secondary | ICD-10-CM

## 2014-11-30 DIAGNOSIS — K529 Noninfective gastroenteritis and colitis, unspecified: Secondary | ICD-10-CM | POA: Diagnosis not present

## 2014-11-30 DIAGNOSIS — N390 Urinary tract infection, site not specified: Secondary | ICD-10-CM

## 2014-11-30 LAB — CBC WITH DIFFERENTIAL/PLATELET
BASOS ABS: 0 10*3/uL (ref 0.0–0.1)
Basophils Relative: 0.3 % (ref 0.0–3.0)
EOS PCT: 1.6 % (ref 0.0–5.0)
Eosinophils Absolute: 0.2 10*3/uL (ref 0.0–0.7)
HCT: 41.2 % (ref 36.0–46.0)
Hemoglobin: 13.7 g/dL (ref 12.0–15.0)
Lymphocytes Relative: 20.7 % (ref 12.0–46.0)
Lymphs Abs: 2 10*3/uL (ref 0.7–4.0)
MCHC: 33.1 g/dL (ref 30.0–36.0)
MCV: 88.4 fl (ref 78.0–100.0)
MONOS PCT: 12.1 % — AB (ref 3.0–12.0)
Monocytes Absolute: 1.2 10*3/uL — ABNORMAL HIGH (ref 0.1–1.0)
Neutro Abs: 6.3 10*3/uL (ref 1.4–7.7)
Neutrophils Relative %: 65.3 % (ref 43.0–77.0)
PLATELETS: 185 10*3/uL (ref 150.0–400.0)
RBC: 4.67 Mil/uL (ref 3.87–5.11)
RDW: 15.4 % (ref 11.5–15.5)
WBC: 9.7 10*3/uL (ref 4.0–10.5)

## 2014-11-30 LAB — LIPID PANEL
Cholesterol: 105 mg/dL (ref 0–200)
HDL: 28.6 mg/dL — AB (ref >39.00)
NONHDL: 76.4
Total CHOL/HDL Ratio: 4
Triglycerides: 256 mg/dL — ABNORMAL HIGH (ref 0.0–149.0)
VLDL: 51.2 mg/dL — ABNORMAL HIGH (ref 0.0–40.0)

## 2014-11-30 LAB — BASIC METABOLIC PANEL
BUN: 20 mg/dL (ref 6–23)
CO2: 32 mEq/L (ref 19–32)
CREATININE: 0.84 mg/dL (ref 0.40–1.20)
Calcium: 9.3 mg/dL (ref 8.4–10.5)
Chloride: 100 mEq/L (ref 96–112)
GFR: 70.66 mL/min (ref >60.00)
GLUCOSE: 66 mg/dL — AB (ref 70–99)
Potassium: 4 mEq/L (ref 3.5–5.1)
Sodium: 139 mEq/L (ref 135–145)

## 2014-11-30 LAB — POCT URINALYSIS DIPSTICK
BILIRUBIN UA: NEGATIVE
GLUCOSE UA: NEGATIVE
Ketones, UA: NEGATIVE
LEUKOCYTES UA: NEGATIVE
NITRITE UA: NEGATIVE
PH UA: 5.5
Protein, UA: NEGATIVE
Spec Grav, UA: 1.025
Urobilinogen, UA: 4

## 2014-11-30 LAB — CULTURE, BLOOD (ROUTINE X 2)
CULTURE: NO GROWTH
Culture: NO GROWTH

## 2014-11-30 LAB — HEPATIC FUNCTION PANEL
ALBUMIN: 3.2 g/dL — AB (ref 3.5–5.2)
ALT: 20 U/L (ref 0–35)
AST: 25 U/L (ref 0–37)
Alkaline Phosphatase: 48 U/L (ref 39–117)
Bilirubin, Direct: 0.1 mg/dL (ref 0.0–0.3)
TOTAL PROTEIN: 6.4 g/dL (ref 6.0–8.3)
Total Bilirubin: 0.3 mg/dL (ref 0.2–1.2)

## 2014-11-30 LAB — HEMOGLOBIN A1C: Hgb A1c MFr Bld: 7.3 % — ABNORMAL HIGH (ref 4.6–6.5)

## 2014-11-30 LAB — MICROALBUMIN / CREATININE URINE RATIO
CREATININE, U: 44.1 mg/dL
MICROALB/CREAT RATIO: 21.1 mg/g (ref 0.0–30.0)
Microalb, Ur: 9.3 mg/dL — ABNORMAL HIGH (ref 0.0–1.9)

## 2014-11-30 LAB — LDL CHOLESTEROL, DIRECT: LDL DIRECT: 42 mg/dL

## 2014-11-30 NOTE — Progress Notes (Signed)
Pre visit review using our clinic review tool, if applicable. No additional management support is needed unless otherwise documented below in the visit note. 

## 2014-11-30 NOTE — Addendum Note (Signed)
Addended by: Modena Morrow D on: 11/30/2014 04:01 PM   Modules accepted: Orders

## 2014-11-30 NOTE — Patient Instructions (Signed)

## 2014-11-30 NOTE — Assessment & Plan Note (Signed)
con't crestor Check labs

## 2014-11-30 NOTE — Assessment & Plan Note (Addendum)
Stable con't lisinopril , metoprolol

## 2014-11-30 NOTE — Assessment & Plan Note (Signed)
Check labs Con't crestor

## 2014-11-30 NOTE — Assessment & Plan Note (Signed)
Recheck today. 

## 2014-11-30 NOTE — Progress Notes (Signed)
Patient ID: Heidi Stark, female    DOB: 01/09/1941  Age: 74 y.o. MRN: [id number]    Subjective:  Subjective HPI Heidi Stark presents for f/u from hospital for colitis and UTI.  She was discharged 11/26/2014 and admitted 11/23/2014  Review of Systems  Constitutional: Negative for diaphoresis, appetite change, fatigue and unexpected weight change.  Eyes: Negative for pain, redness and visual disturbance.  Respiratory: Negative for cough, chest tightness, shortness of breath and wheezing.   Cardiovascular: Negative for chest pain, palpitations and leg swelling.  Gastrointestinal: Negative for nausea, vomiting, abdominal pain, diarrhea, constipation, blood in stool and abdominal distention.  Endocrine: Negative for cold intolerance, heat intolerance, polydipsia, polyphagia and polyuria.  Genitourinary: Negative for dysuria, urgency, frequency, hematuria, flank pain and difficulty urinating.  Neurological: Negative for dizziness, light-headedness, numbness and headaches.  Psychiatric/Behavioral: Negative for dysphoric mood and decreased concentration. The patient is not nervous/anxious.     History Past Medical History  Diagnosis Date  . COPD (chronic obstructive pulmonary disease)   . Hypertension   . Hyperlipidemia   . Diabetes mellitus     Type II  . DVT (deep venous thrombosis)   . PAF (paroxysmal atrial fibrillation)     She has past surgical history that includes Tubal ligation; Knee surgery; Cystectomy; Back surgery; Ganglion cyst excision; and TEE without cardioversion (N/A, 10/31/2013).   Her family history includes Diabetes in an other family member; Factor V Leiden deficiency in her daughter; Melanoma in an other family member.She reports that she has been smoking Cigarettes.  She has been smoking about 0.50 packs per day. She has never used smokeless tobacco. She reports that she drinks alcohol. She reports that she does not use illicit drugs.  Current Outpatient  Prescriptions on File Prior to Visit  Medication Sig Dispense Refill  . apixaban (ELIQUIS) 5 MG TABS tablet Take 1 tablet (5 mg total) by mouth 2 (two) times daily. 60 tablet 0  . ciprofloxacin (CIPRO) 500 MG tablet Take 1 tablet (500 mg total) by mouth 2 (two) times daily. 10 tablet 0  . diphenoxylate-atropine (LOMOTIL) 2.5-0.025 MG per tablet Take 2 tablets by mouth 4 (four) times daily as needed for diarrhea or loose stools. 16 tablet 0  . fenofibrate 160 MG tablet Take 1 tablet (160 mg total) by mouth daily. 30 tablet 5  . Fluticasone-Salmeterol (ADVAIR) 250-50 MCG/DOSE AEPB Inhale 1 puff into the lungs every 12 (twelve) hours. As needed for shortness of breath 60 each 2  . furosemide (LASIX) 20 MG tablet Take 1 tablet (20 mg total) by mouth 2 (two) times daily. 180 tablet 1  . glimepiride (AMARYL) 2 MG tablet Take 2 mg by mouth daily with supper.     Marland Kitchen glucose blood test strip Check Blood Sugars four time daily 100 each 2  . insulin glargine (LANTUS) 100 UNIT/ML injection Inject 10 Units into the skin at bedtime. 10 units qhs x 30 days    . KAZANO 12.11-998 MG TABS TAKE 1 TABLET BY MOUTH TWICE A DAY 60 tablet 5  . lisinopril (PRINIVIL,ZESTRIL) 5 MG tablet Take 1 tablet (5 mg total) by mouth daily. 30 tablet 5  . loratadine (CLARITIN) 10 MG tablet Take 1 tablet (10 mg total) by mouth daily. 30 tablet 0  . metoprolol tartrate (LOPRESSOR) 25 MG tablet Take 0.5 tablets (12.5 mg total) by mouth 2 (two) times daily. 30 tablet 2  . metroNIDAZOLE (FLAGYL) 500 MG tablet Take 1 tablet (500 mg total) by mouth 3 (  three) times daily. 15 tablet 0  . ondansetron (ZOFRAN ODT) 8 MG disintegrating tablet Take 1 tablet (8 mg total) by mouth every 8 (eight) hours as needed for nausea or vomiting. 9 tablet 0  . potassium chloride (KLOR-CON 10) 10 MEQ tablet Take 1 tablet (10 mEq total) by mouth 2 (two) times daily. 180 tablet 1  . rosuvastatin (CRESTOR) 20 MG tablet Take 10 mg by mouth daily.      No current  facility-administered medications on file prior to visit.     Objective:  Objective Physical Exam  Constitutional: She is oriented to person, place, and time. She appears well-developed and well-nourished.  HENT:  Head: Normocephalic and atraumatic.  Eyes: Conjunctivae and EOM are normal.  Neck: Normal range of motion. Neck supple. No JVD present. Carotid bruit is not present. No thyromegaly present.  Cardiovascular: Normal rate, regular rhythm and normal heart sounds.   No murmur heard. Pulmonary/Chest: Effort normal and breath sounds normal. No respiratory distress. She has no wheezes. She has no rales. She exhibits no tenderness.  Musculoskeletal: She exhibits no edema.  Neurological: She is alert and oriented to person, place, and time.  Skin: Skin is warm and dry.  Psychiatric: She has a normal mood and affect. Her behavior is normal. Judgment and thought content normal.   BP 128/74 mmHg  Pulse 88  Temp(Src) 98.8 F (37.1 C) (Oral)  Resp 18  Wt 254 lb (115.214 kg)  SpO2 97% Wt Readings from Last 3 Encounters:  11/30/14 254 lb (115.214 kg)  11/23/14 249 lb (112.946 kg)  11/23/14 249 lb (112.946 kg)     Lab Results  Component Value Date   WBC 17.5* 11/25/2014   HGB 12.4 11/25/2014   HCT 38.8 11/25/2014   PLT 197 11/25/2014   GLUCOSE 187* 11/24/2014   CHOL 136 06/23/2014   TRIG 232* 06/23/2014   HDL 33* 06/23/2014   LDLDIRECT 62.5 12/12/2012   LDLCALC 57 06/23/2014   ALT 18 11/23/2014   AST 16 11/23/2014   NA 132* 11/24/2014   K 3.6 11/24/2014   CL 97* 11/24/2014   CREATININE 0.60 11/24/2014   BUN 15 11/24/2014   CO2 24 11/24/2014   TSH 3.30 11/07/2007   INR 2.7 03/09/2014   HGBA1C 7.5* 11/24/2014   MICROALBUR 13.4* 12/12/2012    Ct Abdomen Pelvis W Contrast  11/24/2014   CLINICAL DATA:  Diarrhea for 3 days. Abdominal pain. Primary care physician sent the patient to the ER.  EXAM: CT ABDOMEN AND PELVIS WITH CONTRAST  TECHNIQUE: Multidetector CT imaging of  the abdomen and pelvis was performed using the standard protocol following bolus administration of intravenous contrast.  CONTRAST:  1mL OMNIPAQUE IOHEXOL 300 MG/ML SOLN, 140mL OMNIPAQUE IOHEXOL 300 MG/ML SOLN  COMPARISON:  10/28/2013  FINDINGS: Atelectasis in the lung bases. Cardiac enlargement. Calcification in the mitral valve annulus.  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 The liver, gallbladder, pancreas, inferior vena cava, and retroperitoneal lymph nodes are unremarkable. Enlarged left adrenal gland similar to prior study, probably representing adenoma or hyperplasia. Small low-attenuation lesions in the kidneys too small to characterize but likely representing cysts. Focal areas of scarring in both kidneys. No hydronephrosis. Calcification of abdominal aorta without aneurysm. Stomach and small bowel are not abnormally distended. No free air or free fluid in the abdomen. Small right anterior abdominal wall hernia containing fat.  Pelvis: There is diffuse wall thickening demonstrated in the right colon and transverse  colon. This is most consistent with inflammatory process such as colitis. Etiology is indeterminate in this could represent infectious or inflammatory process. Appendix is normal. Bladder wall is not thickened. Uterus and ovaries are not enlarged. No free or loculated pelvic fluid collections. No pelvic mass or lymphadenopathy. Degenerative changes in the spine. Degenerative changes in the hips.  IMPRESSION: Colonic wall thickening most likely to represent colitis of nonspecific etiology. No evidence of bowel obstruction. Scarring in the kidneys and spleen.   Electronically Signed   By: Lucienne Capers M.D.   On: 11/24/2014 00:12     Assessment & Plan:  Plan I am having Ms. Galer maintain her glucose blood, glimepiride, rosuvastatin, furosemide, apixaban, Fluticasone-Salmeterol, KAZANO, potassium chloride, metoprolol tartrate, fenofibrate,  lisinopril, insulin glargine, loratadine, diphenoxylate-atropine, ondansetron, ciprofloxacin, and metroNIDAZOLE.  No orders of the defined types were placed in this encounter.    Problem List Items Addressed This Visit    Type II diabetes mellitus, well controlled    Check labs Con't crestor      HLD (hyperlipidemia)    con't crestor Check labs      Essential hypertension (Chronic)    Stable con't lisinopril , metoprolol      Colitis - Primary    F/u GI Still some loose stools but improved      Relevant Orders   Basic metabolic panel   Hepatic function panel   POCT urinalysis dipstick (Completed)   Ambulatory referral to Gastroenterology   Acute UTI    Recheck today       Other Visit Diagnoses    Hx: UTI (urinary tract infection)        Relevant Orders    POCT urinalysis dipstick (Completed)    Hyperlipidemia        Relevant Orders    Lipid panel    Type 2 diabetes mellitus with diabetic neuropathic arthropathy        Relevant Orders    Hemoglobin A1c    Microalbumin / creatinine urine ratio       Follow-up: Return in about 6 months (around 06/02/2015), or if symptoms worsen or fail to improve, for f/u and labs.  Garnet Koyanagi, DO     [phone]

## 2014-11-30 NOTE — Assessment & Plan Note (Signed)
F/u GI Still some loose stools but improved

## 2014-12-04 ENCOUNTER — Other Ambulatory Visit: Payer: Self-pay | Admitting: Family Medicine

## 2014-12-04 ENCOUNTER — Telehealth: Payer: Self-pay | Admitting: *Deleted

## 2014-12-04 NOTE — Telephone Encounter (Signed)
Patient called and stated that she is in the donut hole and the eliquis is costing $200/mo. I will provide her with samples and give her a patient assistance form.

## 2014-12-30 ENCOUNTER — Other Ambulatory Visit: Payer: Self-pay | Admitting: Family Medicine

## 2014-12-31 NOTE — Telephone Encounter (Signed)
Rx sent to the pharmacy by e-script.//AB/CMA 

## 2015-01-05 ENCOUNTER — Telehealth: Payer: Self-pay | Admitting: Cardiology

## 2015-01-06 ENCOUNTER — Other Ambulatory Visit: Payer: Self-pay

## 2015-01-06 MED ORDER — APIXABAN 5 MG PO TABS: 5.0000 mg | ORAL_TABLET | Freq: Two times a day (BID) | ORAL | Status: DC

## 2015-01-07 ENCOUNTER — Telehealth: Payer: Self-pay

## 2015-01-07 NOTE — Telephone Encounter (Signed)
Patient's husband came to office to pick up samples of eliquis 5 mg pt in doughnut hole

## 2015-01-08 ENCOUNTER — Encounter: Payer: Self-pay | Admitting: Internal Medicine

## 2015-01-08 ENCOUNTER — Telehealth: Payer: Self-pay

## 2015-01-08 NOTE — Telephone Encounter (Signed)
Patient Assistance application faxed to [place], along with a Rx for Eliquis.

## 2015-01-11 ENCOUNTER — Other Ambulatory Visit: Payer: Self-pay

## 2015-01-21 ENCOUNTER — Telehealth: Payer: Self-pay

## 2015-01-21 NOTE — Telephone Encounter (Signed)
Patient denied assistance for Eliquis. Apparently she has 3rd party prescription insurance. Notified of this.

## 2015-01-26 ENCOUNTER — Telehealth: Payer: Self-pay

## 2015-01-26 ENCOUNTER — Other Ambulatory Visit: Payer: Self-pay | Admitting: Family Medicine

## 2015-01-26 NOTE — Telephone Encounter (Signed)
Patient advised she has been accepted into the PA program from [employer] for her Eliquis.

## 2015-02-01 ENCOUNTER — Other Ambulatory Visit: Payer: Self-pay | Admitting: Family Medicine

## 2015-02-04 ENCOUNTER — Ambulatory Visit: Payer: Medicare Other | Admitting: Internal Medicine

## 2015-03-09 ENCOUNTER — Other Ambulatory Visit: Payer: Self-pay

## 2015-03-09 MED ORDER — FENOFIBRATE 160 MG PO TABS: 160.0000 mg | ORAL_TABLET | Freq: Every day | ORAL | Status: DC

## 2015-03-09 MED ORDER — METOPROLOL TARTRATE 25 MG PO TABS: 12.5000 mg | ORAL_TABLET | Freq: Two times a day (BID) | ORAL | Status: DC

## 2015-03-09 MED ORDER — LISINOPRIL 5 MG PO TABS: 5.0000 mg | ORAL_TABLET | Freq: Every day | ORAL | Status: DC

## 2015-03-15 ENCOUNTER — Encounter: Payer: Self-pay | Admitting: Family Medicine

## 2015-03-24 ENCOUNTER — Encounter: Payer: Self-pay | Admitting: Internal Medicine

## 2015-03-24 ENCOUNTER — Telehealth: Payer: Self-pay

## 2015-03-24 ENCOUNTER — Ambulatory Visit (INDEPENDENT_AMBULATORY_CARE_PROVIDER_SITE_OTHER): Payer: Medicare Other | Admitting: Internal Medicine

## 2015-03-24 VITALS — BP 134/70 | HR 84 | Ht 66.0 in | Wt 255.0 lb

## 2015-03-24 DIAGNOSIS — Z86010 Personal history of colonic polyps: Secondary | ICD-10-CM | POA: Diagnosis not present

## 2015-03-24 DIAGNOSIS — I480 Paroxysmal atrial fibrillation: Secondary | ICD-10-CM | POA: Diagnosis not present

## 2015-03-24 DIAGNOSIS — Z7901 Long term (current) use of anticoagulants: Secondary | ICD-10-CM | POA: Diagnosis not present

## 2015-03-24 DIAGNOSIS — A09 Infectious gastroenteritis and colitis, unspecified: Secondary | ICD-10-CM

## 2015-03-24 DIAGNOSIS — K529 Noninfective gastroenteritis and colitis, unspecified: Secondary | ICD-10-CM

## 2015-03-24 DIAGNOSIS — Z794 Long term (current) use of insulin: Secondary | ICD-10-CM

## 2015-03-24 NOTE — Telephone Encounter (Signed)
OK to stop Eliquis the 2 days before the procedure then the day of procedure.

## 2015-03-24 NOTE — Progress Notes (Signed)
   Subjective:    Patient ID: Heidi Stark, female    DOB: 06-07-1941, 74 y.o.   MRN: [id number] Cc: colitis f/u - was hospita;lized w/ diarrhea and had colitis on CT HPI Very nice elderly ww w/ hx adenoma 2003 (inadequate prep) and no colonoscopy since. Was recently hospitalized with diarrheal illness and CT showed "colitis". Received hydration and course of Abx with rapid improvement. No bleeding.  She is completely better and no chronic sxs. Husband was ill w/ same problem then also. A daughter has Crohn's and is about to have a segmental resection at [facility].  GI ROS o/w neg  Medications, allergies, past medical history, past surgical history, family history and social history are reviewed and updated in the EMR.  Review of Systems As above - some night sweats, insomnia, dyspnea All other ROS neg    Objective:   Physical Exam @BP  134/70 mmHg  Pulse 84  Ht 5\' 6"  (1.676 m)  Wt 255 lb (115.667 kg)  BMI 41.18 kg/m2@  General:  Well-developed, well-nourished and in no acute distress Eyes:  anicteric. ENT:   Mouth and posterior pharynx free of lesions.  Neck:   supple w/o thyromegaly or mass.  Lungs: Clear to auscultation bilaterally. Heart:  S1S2, no rubs, murmurs, gallops. Abdomen:  soft, non-tender, no hepatosplenomegaly, hernia, or mass and BS+.  Lymph:  no cervical or supraclavicular adenopathy. Extremities:   no edema, cyanosis or clubbing Skin   no rash. Neuro:  A&O x 3.  Psych:  appropriate mood and  Affect.   Data Reviewed: CT scan , hospital notes from recent colitis admit + labs 2003 colonoscopy and pathology      Assessment & Plan:  Colitis presumed infectious  Hx of adenomatous polyp of colon  Long term current use of anticoagulant - apixiban  PAF (paroxysmal atrial fibrillation) w/ hx splenic and renal emboli  Long term current use of insulin   Infectious colitis is over/resolved.  Should have a surveillance colonoscopy - have advised she hold Eliquis  (apixiban) 1 day before and we will check with dr. Freddy Finner about this also - she did have systemic emboli - but no strokes. Emboli before A/C tx.  The risks and benefits as well as alternatives of endoscopic procedure(s) have been discussed and reviewed. All questions answered. The patient agrees to proceed.  I appreciate the opportunity to care for you.

## 2015-03-24 NOTE — Patient Instructions (Signed)
You have been scheduled for a colonoscopy. Please follow written instructions given to you at your visit today.  Please pick up your over the counter prep supplies at the pharmacy. If you use inhalers (even only as needed), please bring them with you on the day of your procedure.  You will be contaced by our office prior to your procedure for directions on holding your Eliquis.  If you do not hear from our office 1 week prior to your scheduled procedure, please call [phone] to discuss.   I appreciate the opportunity to care for you. Silvano Rusk, MD, [facility]

## 2015-03-24 NOTE — Telephone Encounter (Signed)
[facility] [address] [place], [id number]  03/24/2015   RE: Heidi Stark DOB: [birth year] MRN: [id number]   Dear [name] MD,    We have scheduled the above patient for an endoscopic procedure. Our records show that she is on anticoagulation therapy.   Please advise as to how long the patient may come off her therapy of Eliquis prior to the colonoscopy procedure, which is scheduled for 04/28/15.  Please fax back/ or route the completed form to Kerem Gilmer [place], [place] at [phone].   Sincerely,    Dr. Silvano Rusk

## 2015-03-25 NOTE — Telephone Encounter (Signed)
Spoke with patient and informed her on holding her Eliquis, she verbalized understanding.

## 2015-03-25 NOTE — Telephone Encounter (Signed)
Left voicemail message to call me back.

## 2015-04-07 ENCOUNTER — Telehealth: Payer: Self-pay

## 2015-04-07 NOTE — Telephone Encounter (Signed)
Pt states that she feels overwhelmed with doctors visits at the moment and will call back to schedule medicare wellness.

## 2015-04-28 ENCOUNTER — Encounter: Payer: Medicare Other | Admitting: Internal Medicine

## 2015-05-04 ENCOUNTER — Encounter: Payer: Self-pay | Admitting: Cardiology

## 2015-05-04 ENCOUNTER — Other Ambulatory Visit: Payer: Self-pay | Admitting: Family Medicine

## 2015-05-05 NOTE — Telephone Encounter (Signed)
Refill sent per [facility] refill protocol/SLS  

## 2015-05-07 ENCOUNTER — Encounter: Payer: Self-pay | Admitting: Cardiology

## 2015-05-07 ENCOUNTER — Ambulatory Visit (INDEPENDENT_AMBULATORY_CARE_PROVIDER_SITE_OTHER): Payer: Medicare Other | Admitting: Cardiology

## 2015-05-07 VITALS — BP 160/80 | HR 69 | Ht 66.0 in | Wt 254.8 lb

## 2015-05-07 DIAGNOSIS — I739 Peripheral vascular disease, unspecified: Secondary | ICD-10-CM

## 2015-05-07 DIAGNOSIS — I480 Paroxysmal atrial fibrillation: Secondary | ICD-10-CM

## 2015-05-07 DIAGNOSIS — I10 Essential (primary) hypertension: Secondary | ICD-10-CM

## 2015-05-07 MED ORDER — LISINOPRIL 20 MG PO TABS: 20.0000 mg | ORAL_TABLET | Freq: Every day | ORAL | Status: DC

## 2015-05-07 NOTE — Patient Instructions (Signed)
Medication Instructions:  Your physician has recommended you make the following change in your medication:  1) INCREASE Lisinopril 20 mg by mouth ONCE daily  You can try Melatonin at bedtime to help with sleep.   If you need a refill on your cardiac medications before your next appointment, please call your pharmacy.  Labwork: Your physician recommends that you return for lab work in: 2 weeks (BMET)   Testing/Procedures: none  Follow-Up: Your physician wants you to follow-up in: 6 months with Dr. [name]. You will receive a reminder letter in the mail two months in advance. If you don't receive a letter, please call our office to schedule the follow-up appointment.   Any Other Special Instructions Will Be Listed Below (If Applicable).

## 2015-05-09 NOTE — Progress Notes (Signed)
Patient ID: Heidi Stark, female   DOB: Apr 18, 1941, 74 y.o.   MRN: [id number] PCP: Dr. [name]  74 yo with history of HTN, DM, hyperlipidemia presents for cardiology followup.  She was admitted in 4/15 with splenic and renal infarcts.  She was found to have paroxysmal atrial fibrillation.  In 4/15, she had fever and flank pain.  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  TEE showed prominent aortic plaque.  While she was in the hospital, she was noted to have a brief run of atrial fibrillation and was started on coumadin.    Symptomatically stable.  She has not felt any tachypalpitations. 30 day event monitor in 4/15 showed no atrial fibrillation.  Mild fatigue in general.  No dyspnea walking on flat ground, mild dyspnea with steps.  No chest pain.  She has left leg pain with ambulation after about 100 feet, LE dopplers 5/15 showed occluded left external iliac artery.  This has been present ever since she had left iliac damage with a prior back surgery.  No melena/BRBPR.  Her weight continues to rise.  She attributes this to stress and lack of exercise.  BP is running high.   ECG: NSR, left axis deviation, iRBBB  Labs (4/15): K 4.1, creatinine 0.7, HCT 46.2 Labs (7/15): K 3.9, creatinine 0.8, HCT 50 Labs (12/15): K 5, creatinine 0.7, HDL 33, LDL 57 Labs (5/16): K 4, creatinine 0.84, TGs 256, HDL 29, LDL 42, HCT 41.2  PMH: 1.Type II diabetes 2. HTN 3. Hyperlipidemia 4. COPD 5. LHC (2007) reportedly normal.  6. Paroxysmal atrial fibrillation: First noted in 4/15.  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  TEE (4/15) with EF 50-55%, mild LVH, grade III-IV plaque in descending thoracic aorta, RV normal, no PFO, peak RV-RA gradient 42 mmHg.  Event monitor (4/15) with no atrial fibrillation.  7. H/o diskectomy. 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 Lower extremity arterial dopplers (5/15) with occluded  left external iliac artery.   SH: Married, 2 kids, quit smoking in 2015.  FH: Daughter with factor V Leiden deficiency.  Brother with CABG.  Sister with aneurysm.   ROS: All systems reviewed and negative except as per HPI.   Current Outpatient Prescriptions  Medication Sig Dispense Refill  . ADVAIR DISKUS 250-50 MCG/DOSE AEPB INHALE 1 PUFF INTO THE LUNGS EVERY 12 (TWELVE) HOURS. AS NEEDED FOR SHORTNESS OF BREATH 60 each 2  . apixaban (ELIQUIS) 5 MG TABS tablet Take 1 tablet (5 mg total) by mouth 2 (two) times daily. 180 tablet 3  . fenofibrate 160 MG tablet Take 1 tablet (160 mg total) by mouth daily. 90 tablet 1  . furosemide (LASIX) 20 MG tablet TAKE 1 TABLET (20 MG TOTAL) BY MOUTH 2 (TWO) TIMES DAILY. 180 tablet 1  . glimepiride (AMARYL) 2 MG tablet Take 2 mg by mouth daily with breakfast.     . glucose blood test strip Check Blood Sugars four time daily 100 each 2  . insulin glargine (LANTUS) 100 UNIT/ML injection Inject 10 Units into the skin at bedtime. 10 units qhs x 30 days    . KAZANO 12.11-998 MG TABS TAKE 1 TABLET BY MOUTH TWICE A DAY 60 tablet 5  . lisinopril (PRINIVIL,ZESTRIL) 20 MG tablet Take 1 tablet (20 mg total) by mouth daily. 90 tablet 3  . metFORMIN (GLUCOPHAGE) 1000 MG tablet     . metoprolol tartrate (LOPRESSOR) 25  MG tablet Take 0.5 tablets (12.5 mg total) by mouth 2 (two) times daily. 90 tablet 1  . potassium chloride (KLOR-CON 10) 10 MEQ tablet Take 1 tablet (10 mEq total) by mouth 2 (two) times daily. 180 tablet 1  . rosuvastatin (CRESTOR) 10 MG tablet      No current facility-administered medications for this visit.    BP 130/65  Pulse 60  Ht 5\' 9"  (1.753 m)  Wt 111.131 kg (245 lb)  BMI 36.16 kg/m2 General: NAD Neck: No JVD, no thyromegaly or thyroid nodule.  Lungs: Clear to auscultation bilaterally with normal respiratory effort. CV: Nondisplaced PMI.  Heart regular S1/S2, no S3/S4, 1/6 early SEM.  1+ ankle edema.  No carotid bruit.  I am unable to  palpate left PT pulse.  Abdomen: Soft, nontender, no hepatosplenomegaly, no distention.  Skin: Intact without lesions or rashes.  Neurologic: Alert and oriented x 3.  Psych: Normal affect. Extremities: No clubbing or cyanosis.  HEENT: Normal.   Assessment/Plan: 1. Atrial fibrillation: Paroxysmal.  1 episode noted in the hospital.  Suspect the splenic and renal infarcts were cardio-embolic.  30 day monitor in 4/15 showed no further atrial fibrillation.   - Continue metoprolol. - Continue Eliquis.  CBC has been ok.   2. Claudication: Left leg claudication with absent left PT pulse.  Suspect this is related to prior damage to the iliac artery on that side, peripheral arterial dopplers in 5/15 confirmed this.  3. Obesity: I counseled her to work on weight loss.  4. Hyperlipidemia: Goal LDL ideally < 70.  LDL was 42 in 5/16.  5. HTN: BP running high.  Need BP control to decrease risk of recurrent atrial fibrillation.  Increase lisinopril to 20 mg daily.  BMET in 2 wks.     [name] 73/23/2016

## 2015-05-13 NOTE — Telephone Encounter (Signed)
Opened in error

## 2015-05-21 ENCOUNTER — Other Ambulatory Visit: Payer: Medicare Other

## 2015-05-24 ENCOUNTER — Other Ambulatory Visit (INDEPENDENT_AMBULATORY_CARE_PROVIDER_SITE_OTHER): Payer: Medicare Other | Admitting: *Deleted

## 2015-05-24 DIAGNOSIS — I10 Essential (primary) hypertension: Secondary | ICD-10-CM

## 2015-05-24 LAB — BASIC METABOLIC PANEL
BUN: 14 mg/dL (ref 7–25)
CALCIUM: 8.7 mg/dL (ref 8.6–10.4)
CHLORIDE: 99 mmol/L (ref 98–110)
CO2: 29 mmol/L (ref 20–31)
CREATININE: 0.76 mg/dL (ref 0.60–0.93)
GLUCOSE: 125 mg/dL — AB (ref 65–99)
Potassium: 4.1 mmol/L (ref 3.5–5.3)
Sodium: 139 mmol/L (ref 135–146)

## 2015-06-01 ENCOUNTER — Other Ambulatory Visit: Payer: Self-pay | Admitting: Family Medicine

## 2015-07-23 ENCOUNTER — Ambulatory Visit (INDEPENDENT_AMBULATORY_CARE_PROVIDER_SITE_OTHER): Payer: Medicare Other | Admitting: Family Medicine

## 2015-07-23 ENCOUNTER — Encounter: Payer: Self-pay | Admitting: Family Medicine

## 2015-07-23 VITALS — BP 147/63 | HR 68 | Temp 98.3°F | Resp 18 | Ht 66.0 in | Wt 247.4 lb

## 2015-07-23 DIAGNOSIS — J441 Chronic obstructive pulmonary disease with (acute) exacerbation: Secondary | ICD-10-CM | POA: Diagnosis not present

## 2015-07-23 MED ORDER — BENZONATATE 200 MG PO CAPS: 200.0000 mg | ORAL_CAPSULE | Freq: Three times a day (TID) | ORAL | Status: DC | PRN

## 2015-07-23 MED ORDER — IPRATROPIUM-ALBUTEROL 0.5-2.5 (3) MG/3ML IN SOLN
3.0000 mL | Freq: Once | RESPIRATORY_TRACT | Status: AC
  Administered 2015-07-23: 3 mL via RESPIRATORY_TRACT

## 2015-07-23 MED ORDER — ALBUTEROL SULFATE HFA 108 (90 BASE) MCG/ACT IN AERS: 2.0000 | INHALATION_SPRAY | Freq: Four times a day (QID) | RESPIRATORY_TRACT | Status: DC | PRN

## 2015-07-23 MED ORDER — DOXYCYCLINE HYCLATE 100 MG PO TABS
100.0000 mg | ORAL_TABLET | Freq: Two times a day (BID) | ORAL | Status: DC
Start: 2015-07-23 — End: 2015-10-11

## 2015-07-23 NOTE — Progress Notes (Signed)
Pre visit review using our clinic review tool, if applicable. No additional management support is needed unless otherwise documented below in the visit note. 

## 2015-07-23 NOTE — Patient Instructions (Signed)
Follow up as needed Start the Doxycycline twice daily- take w/ food- for your COPD exacerbation Use the Albuterol inhaler- 2 puffs every 4 hours as needed for cough/wheezing (your insurance doesn't cover the solution for the machine until we try the inhaler 1st) Use cough pills as needed- you can add Mucinex DM REST! Call with any questions or concerns Hang in there!

## 2015-07-23 NOTE — Progress Notes (Signed)
   Subjective:    Patient ID: Heidi Stark, female    DOB: 1941-02-22, 75 y.o.   MRN: [id number]  HPI URI- 'it started out as a sinus infection'.  Taking Mucinex w/o improvement.  Pt reports copious PND, which causes cough.  Pt coughed all night.  No fevers.  sxs started ~1 week ago.  + sick contacts.  Cough is productive.  + frontal sinus pressure.  No ear pain.  No HA.   Review of Systems For ROS see HPI     Objective:   Physical Exam  Constitutional: She is oriented to person, place, and time. She appears well-developed and well-nourished. No distress.  HENT:  Head: Normocephalic and atraumatic.  TMs normal bilaterally Mild nasal congestion Throat w/out erythema, edema, or exudate  Eyes: Conjunctivae and EOM are normal. Pupils are equal, round, and reactive to light.  Neck: Normal range of motion. Neck supple.  Cardiovascular: Normal rate, regular rhythm, normal heart sounds and intact distal pulses.   No murmur heard. Pulmonary/Chest: Effort normal. No respiratory distress (speaking in complete sentences). She has wheezes (diffuse expiratory wheezes- improved s/p neb tx).  + hacking cough  Lymphadenopathy:    She has no cervical adenopathy.  Neurological: She is alert and oriented to person, place, and time.  Skin: Skin is warm and dry.  Psychiatric: She has a normal mood and affect. Her behavior is normal. Thought content normal.  Vitals reviewed.         Assessment & Plan:

## 2015-07-23 NOTE — Assessment & Plan Note (Signed)
Pt's sxs and PE consistent w/ COPD exacerbation.  Wheezing and air movement improved s/p neb tx in office.  Will start Doxy and pt given albuterol inhaler to use at home.  Will hold on prednisone given pt's diabetes.  Cough meds prn.  Reviewed supportive care and red flags that should prompt return.  Pt expressed understanding and is in agreement w/ plan.

## 2015-08-10 LAB — HM DIABETES EYE EXAM

## 2015-08-11 NOTE — Telephone Encounter (Signed)
error 

## 2015-08-30 ENCOUNTER — Encounter: Payer: Self-pay | Admitting: Family Medicine

## 2015-09-13 ENCOUNTER — Other Ambulatory Visit: Payer: Self-pay | Admitting: Family Medicine

## 2015-09-27 ENCOUNTER — Other Ambulatory Visit: Payer: Self-pay | Admitting: Family Medicine

## 2015-09-27 NOTE — Telephone Encounter (Signed)
Please offer this patient an apt for HTN follow up/Fasting.     KP

## 2015-09-27 NOTE — Telephone Encounter (Signed)
Left message for patient to call to schedule Follow Up

## 2015-09-29 ENCOUNTER — Encounter: Payer: Self-pay | Admitting: Family Medicine

## 2015-09-29 ENCOUNTER — Other Ambulatory Visit: Payer: Self-pay

## 2015-09-29 NOTE — Telephone Encounter (Signed)
Refilled patient medication with #90 and 1 rf for both medications.

## 2015-09-29 NOTE — Telephone Encounter (Signed)
Patient scheduled for a follow up for 10/11/2015 at 9:15am. Patient states Dr. [name] diabetic specialist takes labs every month and would like to know why we have to "double dip" please advise via My Chart

## 2015-10-01 ENCOUNTER — Other Ambulatory Visit: Payer: Self-pay | Admitting: Family Medicine

## 2015-10-11 ENCOUNTER — Ambulatory Visit (INDEPENDENT_AMBULATORY_CARE_PROVIDER_SITE_OTHER): Payer: Medicare Other | Admitting: Family Medicine

## 2015-10-11 ENCOUNTER — Encounter: Payer: Self-pay | Admitting: Family Medicine

## 2015-10-11 VITALS — BP 126/76 | HR 69 | Temp 98.1°F | Ht 65.0 in | Wt 250.4 lb

## 2015-10-11 DIAGNOSIS — Z23 Encounter for immunization: Secondary | ICD-10-CM | POA: Diagnosis not present

## 2015-10-11 DIAGNOSIS — E1151 Type 2 diabetes mellitus with diabetic peripheral angiopathy without gangrene: Secondary | ICD-10-CM | POA: Diagnosis not present

## 2015-10-11 DIAGNOSIS — Z1159 Encounter for screening for other viral diseases: Secondary | ICD-10-CM

## 2015-10-11 DIAGNOSIS — E785 Hyperlipidemia, unspecified: Secondary | ICD-10-CM | POA: Diagnosis not present

## 2015-10-11 DIAGNOSIS — J4520 Mild intermittent asthma, uncomplicated: Secondary | ICD-10-CM

## 2015-10-11 DIAGNOSIS — I10 Essential (primary) hypertension: Secondary | ICD-10-CM

## 2015-10-11 DIAGNOSIS — R8290 Unspecified abnormal findings in urine: Secondary | ICD-10-CM

## 2015-10-11 LAB — POCT URINALYSIS DIPSTICK
BILIRUBIN UA: NEGATIVE
Blood, UA: NEGATIVE
Glucose, UA: NEGATIVE
KETONES UA: NEGATIVE
Nitrite, UA: NEGATIVE
PH UA: 6
Spec Grav, UA: 1.025
Urobilinogen, UA: 0.2

## 2015-10-11 LAB — HEPATITIS C ANTIBODY: HCV Ab: NEGATIVE

## 2015-10-11 LAB — COMPREHENSIVE METABOLIC PANEL
ALT: 18 U/L (ref 0–35)
AST: 23 U/L (ref 0–37)
Albumin: 3.9 g/dL (ref 3.5–5.2)
Alkaline Phosphatase: 38 U/L — ABNORMAL LOW (ref 39–117)
BUN: 18 mg/dL (ref 6–23)
CALCIUM: 9.1 mg/dL (ref 8.4–10.5)
CHLORIDE: 102 meq/L (ref 96–112)
CO2: 32 meq/L (ref 19–32)
CREATININE: 0.56 mg/dL (ref 0.40–1.20)
GFR: 112.55 mL/min (ref >60.00)
Glucose, Bld: 112 mg/dL — ABNORMAL HIGH (ref 70–99)
Potassium: 4.3 mEq/L (ref 3.5–5.1)
SODIUM: 141 meq/L (ref 135–145)
Total Bilirubin: 0.4 mg/dL (ref 0.2–1.2)
Total Protein: 7.2 g/dL (ref 6.0–8.3)

## 2015-10-11 LAB — LIPID PANEL
Cholesterol: 112 mg/dL (ref 0–200)
HDL: 28.9 mg/dL — AB (ref >39.00)
LDL Cholesterol: 46 mg/dL (ref 0–99)
NONHDL: 83.28
Total CHOL/HDL Ratio: 4
Triglycerides: 187 mg/dL — ABNORMAL HIGH (ref 0.0–149.0)
VLDL: 37.4 mg/dL (ref 0.0–40.0)

## 2015-10-11 LAB — HEMOGLOBIN A1C: Hgb A1c MFr Bld: 7.7 % — ABNORMAL HIGH (ref 4.6–6.5)

## 2015-10-11 MED ORDER — GLIMEPIRIDE 2 MG PO TABS: 2.0000 mg | ORAL_TABLET | Freq: Every day | ORAL | Status: DC

## 2015-10-11 MED ORDER — FUROSEMIDE 20 MG PO TABS: 20.0000 mg | ORAL_TABLET | Freq: Two times a day (BID) | ORAL | Status: DC

## 2015-10-11 MED ORDER — METFORMIN HCL 1000 MG PO TABS: 1000.0000 mg | ORAL_TABLET | Freq: Two times a day (BID) | ORAL | Status: DC

## 2015-10-11 MED ORDER — FENOFIBRATE 160 MG PO TABS: ORAL_TABLET | ORAL | Status: DC

## 2015-10-11 MED ORDER — LISINOPRIL 20 MG PO TABS: 20.0000 mg | ORAL_TABLET | Freq: Every day | ORAL | Status: DC

## 2015-10-11 MED ORDER — METOPROLOL TARTRATE 25 MG PO TABS: ORAL_TABLET | ORAL | Status: DC

## 2015-10-11 MED ORDER — INSULIN GLARGINE 100 UNIT/ML ~~LOC~~ SOLN: 15.0000 [IU] | Freq: Every day | SUBCUTANEOUS | Status: DC

## 2015-10-11 MED ORDER — ALBUTEROL SULFATE HFA 108 (90 BASE) MCG/ACT IN AERS: 2.0000 | INHALATION_SPRAY | Freq: Four times a day (QID) | RESPIRATORY_TRACT | Status: DC | PRN

## 2015-10-11 MED ORDER — FLUTICASONE-SALMETEROL 250-50 MCG/DOSE IN AEPB: INHALATION_SPRAY | RESPIRATORY_TRACT | Status: DC

## 2015-10-11 MED ORDER — ROSUVASTATIN CALCIUM 10 MG PO TABS: 10.0000 mg | ORAL_TABLET | Freq: Every day | ORAL | Status: DC

## 2015-10-11 NOTE — Progress Notes (Signed)
Pre visit review using our clinic review tool, if applicable. No additional management support is needed unless otherwise documented below in the visit note. 

## 2015-10-11 NOTE — Assessment & Plan Note (Signed)
Stable con't metoprolol 

## 2015-10-11 NOTE — Progress Notes (Signed)
Patient ID: Heidi Stark, female    DOB: [birth year]  Age: 75 y.o. MRN: [id number]    Subjective:  Subjective HPI LAIHLA ALMESTICA presents for f/u bp, htn and cholesterol  HPI HYPERTENSION  Blood pressure range-not checking   Chest pain- no      Dyspnea- no Lightheadedness- no   Edema- no Other side effects - no   Medication compliance: good Low salt diet- yes  DIABETES  Blood Sugar ranges-good per pt   Polyuria- no New Visual problems- no Hypoglycemic symptoms- no Other side effects-no Medication compliance - good Last eye exam- annual Foot exam- per endo  HYPERLIPIDEMIA  Medication compliance- good RUQ pain- no  Muscle aches- no Other side effects-no  Review of Systems  Constitutional: Negative for diaphoresis, appetite change, fatigue and unexpected weight change.  Eyes: Negative for pain, redness and visual disturbance.  Respiratory: Negative for cough, chest tightness, shortness of breath and wheezing.   Cardiovascular: Negative for chest pain, palpitations and leg swelling.  Endocrine: Negative for cold intolerance, heat intolerance, polydipsia, polyphagia and polyuria.  Genitourinary: Negative for dysuria, frequency and difficulty urinating.  Neurological: Negative for dizziness, light-headedness, numbness and headaches.    History Past Medical History  Diagnosis Date  . COPD (chronic obstructive pulmonary disease) ([place])   . Hypertension   . Hyperlipidemia   . Diabetes mellitus     Type II  . DVT (deep venous thrombosis) ([place])   . PAF (paroxysmal atrial fibrillation) ([place])     She has past surgical history that includes Tubal ligation; Knee surgery; Cystectomy; Back surgery; Ganglion cyst excision; TEE without cardioversion (N/A, 10/31/2013); and Colonoscopy.   Her family history includes Factor V Leiden deficiency in her daughter.She reports that she has been smoking Cigarettes.  She has been smoking about 0.50 packs per day. She has never used  smokeless tobacco. She reports that she does not drink alcohol or use illicit drugs.  Current Outpatient Prescriptions on File Prior to Visit  Medication Sig Dispense Refill  . apixaban (ELIQUIS) 5 MG TABS tablet Take 1 tablet (5 mg total) by mouth 2 (two) times daily. 180 tablet 3  . glucose blood test strip Check Blood Sugars four time daily 100 each 2  . KLOR-CON 10 10 MEQ tablet TAKE 1 TABLET (10 MEQ TOTAL) BY MOUTH 2 (TWO) TIMES DAILY. 180 tablet 0   No current facility-administered medications on file prior to visit.     Objective:  Objective Physical Exam  Constitutional: She is oriented to person, place, and time. She appears well-developed and well-nourished.  HENT:  Head: Normocephalic and atraumatic.  Eyes: Conjunctivae and EOM are normal.  Neck: Normal range of motion. Neck supple. No JVD present. Carotid bruit is not present. No thyromegaly present.  Cardiovascular: Normal rate, regular rhythm and normal heart sounds.   No murmur heard. Pulmonary/Chest: Effort normal and breath sounds normal. No respiratory distress. She has no wheezes. She has no rales. She exhibits no tenderness.  Musculoskeletal: She exhibits no edema.  Neurological: She is alert and oriented to person, place, and time.  Psychiatric: She has a normal mood and affect.  Nursing note and vitals reviewed.  BP 126/76 mmHg  Pulse 69  Temp(Src) 98.1 F (36.7 C) (Oral)  Ht 5\' 5"  (1.651 m)  Wt 250 lb 6.4 oz (113.581 kg)  BMI 41.67 kg/m2  SpO2 97% Wt Readings from Last 3 Encounters:  10/11/15 250 lb 6.4 oz (113.581 kg)  07/23/15 247 lb 6.4 oz (112.22 kg)  05/07/15 254 lb 12.8 oz (115.577 kg)     Lab Results  Component Value Date   WBC 9.7 11/30/2014   HGB 13.7 11/30/2014   HCT 41.2 11/30/2014   PLT 185.0 11/30/2014   GLUCOSE 112* 10/11/2015   CHOL 112 10/11/2015   TRIG 187.0* 10/11/2015   HDL 28.90* 10/11/2015   LDLDIRECT 42.0 11/30/2014   LDLCALC 46 10/11/2015   ALT 18 10/11/2015   AST 23  10/11/2015   NA 141 10/11/2015   K 4.3 10/11/2015   CL 102 10/11/2015   CREATININE 0.56 10/11/2015   BUN 18 10/11/2015   CO2 32 10/11/2015   TSH 3.30 11/07/2007   INR 2.7 03/09/2014   HGBA1C 7.7* 10/11/2015   MICROALBUR 9.3* 11/30/2014    Ct Abdomen Pelvis W Contrast  11/24/2014  CLINICAL DATA:  Diarrhea for 3 days. Abdominal pain. Primary care physician sent the patient to the ER. EXAM: CT ABDOMEN AND PELVIS WITH CONTRAST TECHNIQUE: Multidetector CT imaging of the abdomen and pelvis was performed using the standard protocol following bolus administration of intravenous contrast. CONTRAST:  67mL OMNIPAQUE IOHEXOL 300 MG/ML SOLN, 196mL OMNIPAQUE IOHEXOL 300 MG/ML SOLN COMPARISON:  10/28/2013 FINDINGS: Atelectasis in the lung bases. Cardiac enlargement. Calcification in the mitral valve annulus. 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 The liver, gallbladder, pancreas, inferior vena cava, and retroperitoneal lymph nodes are unremarkable. Enlarged left adrenal gland similar to prior study, probably representing adenoma or hyperplasia. Small low-attenuation lesions in the kidneys too small to characterize but likely representing cysts. Focal areas of scarring in both kidneys. No hydronephrosis. Calcification of abdominal aorta without aneurysm. Stomach and small bowel are not abnormally distended. No free air or free fluid in the abdomen. Small right anterior abdominal wall hernia containing fat. Pelvis: There is diffuse wall thickening demonstrated in the right colon and transverse colon. This is most consistent with inflammatory process such as colitis. Etiology is indeterminate in this could represent infectious or inflammatory process. Appendix is normal. Bladder wall is not thickened. Uterus and ovaries are not enlarged. No free or loculated pelvic fluid collections. No pelvic mass or lymphadenopathy. Degenerative changes in the spine. Degenerative changes  in the hips. IMPRESSION: Colonic wall thickening most likely to represent colitis of nonspecific etiology. No evidence of bowel obstruction. Scarring in the kidneys and spleen. Electronically Signed   By: Lucienne Capers M.D.   On: 11/24/2014 00:12     Assessment & Plan:  Plan I have discontinued Ms. Wrage's KAZANO, doxycycline, and benzonatate. I have changed her ADVAIR DISKUS to Fluticasone-Salmeterol. I have also changed her rosuvastatin, metFORMIN, insulin glargine, glimepiride, and furosemide. Additionally, I am having her maintain her glucose blood, apixaban, KLOR-CON 10, BD PEN NEEDLE NANO U/F, metoprolol tartrate, lisinopril, fenofibrate, and albuterol.  Meds ordered this encounter  Medications  . BD PEN NEEDLE NANO U/F 32G X 4 MM MISC    Sig: See admin instructions.    Refill:  6  . metoprolol tartrate (LOPRESSOR) 25 MG tablet    Sig: TAKE 0.5 TABLETS (12.5 MG TOTAL) BY MOUTH 2 (TWO) TIMES DAILY.    Dispense:  90 tablet    Refill:  1  . rosuvastatin (CRESTOR) 10 MG tablet    Sig: Take 1 tablet (10 mg total) by mouth daily.  . metFORMIN (GLUCOPHAGE) 1000 MG tablet    Sig: Take 1 tablet (1,000 mg total) by mouth 2 (two) times daily with a meal.  . lisinopril (PRINIVIL,ZESTRIL) 20  MG tablet    Sig: Take 1 tablet (20 mg total) by mouth daily.    Dispense:  90 tablet    Refill:  3  . insulin glargine (LANTUS) 100 UNIT/ML injection    Sig: Inject 0.15 mLs (15 Units total) into the skin at bedtime. 10 units qhs x 30 days    Dispense:  10 mL  . glimepiride (AMARYL) 2 MG tablet    Sig: Take 1 tablet (2 mg total) by mouth daily with breakfast.  . furosemide (LASIX) 20 MG tablet    Sig: Take 1 tablet (20 mg total) by mouth 2 (two) times daily.    Dispense:  180 tablet    Refill:  1  . fenofibrate 160 MG tablet    Sig: TAKE 1 TABLET (160 MG TOTAL) BY MOUTH DAILY.    Dispense:  90 tablet    Refill:  1  . albuterol (PROVENTIL HFA;VENTOLIN HFA) 108 (90 Base) MCG/ACT inhaler    Sig:  Inhale 2 puffs into the lungs every 6 (six) hours as needed for wheezing or shortness of breath.    Dispense:  1 Inhaler    Refill:  1  . Fluticasone-Salmeterol (ADVAIR DISKUS) 250-50 MCG/DOSE AEPB    Sig: INHALE 1 PUFF INTO THE LUNGS EVERY 12 (TWELVE) HOURS. AS NEEDED FOR SHORTNESS OF BREATH    Dispense:  64 each    Refill:  2    Problem List Items Addressed This Visit      Unprioritized   Essential hypertension (Chronic)   Relevant Medications   metoprolol tartrate (LOPRESSOR) 25 MG tablet   rosuvastatin (CRESTOR) 10 MG tablet   lisinopril (PRINIVIL,ZESTRIL) 20 MG tablet   furosemide (LASIX) 20 MG tablet   fenofibrate 160 MG tablet   Other Relevant Orders   Comprehensive metabolic panel (Completed)   POCT urinalysis dipstick (Completed)    Other Visit Diagnoses    DM (diabetes mellitus) type II controlled peripheral vascular disorder (HCC)    -  Primary    Relevant Medications    metoprolol tartrate (LOPRESSOR) 25 MG tablet    rosuvastatin (CRESTOR) 10 MG tablet    metFORMIN (GLUCOPHAGE) 1000 MG tablet    lisinopril (PRINIVIL,ZESTRIL) 20 MG tablet    insulin glargine (LANTUS) 100 UNIT/ML injection    glimepiride (AMARYL) 2 MG tablet    furosemide (LASIX) 20 MG tablet    fenofibrate 160 MG tablet    Other Relevant Orders    Comprehensive metabolic panel (Completed)    Hemoglobin A1c (Completed)    Need for pneumococcal vaccination        Relevant Orders    Pneumococcal conjugate vaccine 13-valent (Completed)    Need for hepatitis C screening test        Relevant Orders    Hepatitis C antibody    Hyperlipidemia        Relevant Medications    metoprolol tartrate (LOPRESSOR) 25 MG tablet    rosuvastatin (CRESTOR) 10 MG tablet    lisinopril (PRINIVIL,ZESTRIL) 20 MG tablet    furosemide (LASIX) 20 MG tablet    fenofibrate 160 MG tablet    Other Relevant Orders    Lipid panel (Completed)    POCT urinalysis dipstick (Completed)    Asthma, mild intermittent,  uncomplicated        Relevant Medications    albuterol (PROVENTIL HFA;VENTOLIN HFA) 108 (90 Base) MCG/ACT inhaler    Fluticasone-Salmeterol (ADVAIR DISKUS) 250-50 MCG/DOSE AEPB    Abnormal urine  Relevant Orders    Urine Culture       Follow-up: Return in about 6 months (around 04/12/2016), or if symptoms worsen or fail to improve, for hypertension, hyperlipidemia, diabetes II.  Garnet Koyanagi, DO

## 2015-10-11 NOTE — Patient Instructions (Signed)

## 2015-10-12 LAB — URINE CULTURE

## 2015-10-23 ENCOUNTER — Other Ambulatory Visit: Payer: Self-pay | Admitting: Family Medicine

## 2015-10-28 ENCOUNTER — Other Ambulatory Visit: Payer: Self-pay | Admitting: *Deleted

## 2015-10-28 MED ORDER — APIXABAN 5 MG PO TABS: 5.0000 mg | ORAL_TABLET | Freq: Two times a day (BID) | ORAL | Status: DC

## 2015-12-06 ENCOUNTER — Telehealth: Payer: Self-pay

## 2015-12-06 NOTE — Telephone Encounter (Signed)
Please schedule

## 2015-12-06 NOTE — Telephone Encounter (Signed)
Patient is on my [employer] list for 2017. Patient doesn't have an appt but may be a good candidate for an AWV.

## 2015-12-07 ENCOUNTER — Other Ambulatory Visit: Payer: Self-pay | Admitting: Family Medicine

## 2015-12-07 NOTE — Telephone Encounter (Signed)
Refilled patients k-dur with #[id number] and 0 rf

## 2015-12-07 NOTE — Telephone Encounter (Signed)
Pt declined at this time and said she'll call back

## 2015-12-07 NOTE — Telephone Encounter (Signed)
Noted  

## 2015-12-13 ENCOUNTER — Other Ambulatory Visit: Payer: Self-pay | Admitting: Family Medicine

## 2016-04-24 ENCOUNTER — Telehealth: Payer: Self-pay | Admitting: Family Medicine

## 2016-04-24 NOTE — Telephone Encounter (Signed)
Sent patient a My Chart message and left a voicemail on 9/21 for patient to call and reschedule appointment scheduled for 10/19 with Dr. [name].   Left 2nd message on 10/09/201

## 2016-04-28 NOTE — Telephone Encounter (Signed)
Left message with patients husband asking her to call the office to reschedule her appointment that was scheduled with Dr. [name] for 10/19.

## 2016-05-04 ENCOUNTER — Ambulatory Visit: Payer: Medicare Other | Admitting: *Deleted

## 2016-05-04 ENCOUNTER — Ambulatory Visit: Payer: Medicare Other | Admitting: Family Medicine

## 2016-05-11 ENCOUNTER — Ambulatory Visit (INDEPENDENT_AMBULATORY_CARE_PROVIDER_SITE_OTHER): Payer: Medicare Other | Admitting: Family Medicine

## 2016-05-11 ENCOUNTER — Encounter: Payer: Self-pay | Admitting: Family Medicine

## 2016-05-11 VITALS — BP 152/74 | HR 82 | Temp 98.0°F | Ht 66.0 in | Wt 248.6 lb

## 2016-05-11 DIAGNOSIS — N3001 Acute cystitis with hematuria: Secondary | ICD-10-CM | POA: Diagnosis not present

## 2016-05-11 LAB — POC URINALSYSI DIPSTICK (AUTOMATED)
GLUCOSE UA: NEGATIVE
Nitrite, UA: POSITIVE
UROBILINOGEN UA: 1
pH, UA: 5

## 2016-05-11 MED ORDER — NITROFURANTOIN MONOHYD MACRO 100 MG PO CAPS: 100.0000 mg | ORAL_CAPSULE | Freq: Two times a day (BID) | ORAL | 0 refills | Status: AC

## 2016-05-11 NOTE — Progress Notes (Signed)
Pre visit review using our clinic review tool, if applicable. No additional management support is needed unless otherwise documented below in the visit note. 

## 2016-05-11 NOTE — Progress Notes (Signed)
Chief Complaint  Patient presents with  . Pyelonephritis    Pt states that she has noticed blood in her urine and called urologist who wont be able to get her in sooner and told her to get it checked     Heidi Stark is a 75 y.o. female here for possible UTI.  Duration: 2 days. Symptoms: hematuria Denies: urinary frequency, pain, urinary hesitancy, urinary retention, fever and pain; no weight loss Hx of recurrent UTI? No  ROS:  Constitutional: denies fever GU: As noted in HPI MSK: Denies back pain  Past Medical History:  Diagnosis Date  . COPD (chronic obstructive pulmonary disease) ([place])   . Diabetes mellitus    Type II  . DVT (deep venous thrombosis) ([place])   . Hyperlipidemia   . Hypertension   . PAF (paroxysmal atrial fibrillation) ([facility])    Family History  Problem Relation Age of Onset  . Diabetes    . Melanoma    . Factor V Leiden deficiency Daughter    Social History   Social History  . Marital status: Married  . Number of children: 2   Occupational History  . Retired    Social History Main Topics  . Smoking status: Current Every Day Smoker    Packs/day: 0.50    Types: Cigarettes    Last attempt to quit: 09/14/2005  . Smokeless tobacco: Never Used  . Alcohol use No  . Drug use: No    BP (!) 152/74 (BP Location: Right Arm, Patient Position: Sitting, Cuff Size: Large)   Pulse 82   Temp 98 F (36.7 C) (Oral)   Ht 5\' 6"  (1.676 m)   Wt 248 lb 9.6 oz (112.8 kg)   SpO2 93%   BMI 40.13 kg/m  General: Awake, alert, appears stated age Heart: RRR, no murmurs Lungs: CTAB, normal respiratory effort, no accessory muscle usage Abd: BS+, soft, NT, ND, no masses or organomegaly MSK: No CVA tenderness, neg Lloyd's sign Psych: Age appropriate judgment and insight  Acute cystitis with hematuria - Plan: POCT Urinalysis Dipstick (Automated), nitrofurantoin, macrocrystal-monohydrate, (MACROBID) 100 MG capsule  Orders as above. Good renal function, Macrobid given  allergies. F/u in 7 days if symptoms fail to improve, sooner if things worsen. The patient voiced understanding and agreement to the plan.  [place], DO 05/11/16 3:45 PM

## 2016-05-16 ENCOUNTER — Telehealth: Payer: Self-pay | Admitting: Family Medicine

## 2016-05-16 NOTE — Telephone Encounter (Signed)
Patient was seen on 05/11/16 for UTI. She was prescribed medication and just finished it this morning. She states that she does feel a lot better, but the UTI is not clear, she is still having blood in her urine. She would like to know if she should refill the medication or what she should do. Please advise.   Patient phone: [phone]

## 2016-05-16 NOTE — Telephone Encounter (Signed)
Pt states UTI is getting better but is not cleared. Did not have bleeding yesterday but has had some this morning. I have told patient that she would need to be evaluated before being prescribing another antibiotic. Please advise.

## 2016-05-17 NOTE — Telephone Encounter (Signed)
Not all "UTI symptoms" are caused by UTI. Please schedule appt if she is still having symptoms.

## 2016-05-18 ENCOUNTER — Encounter: Payer: Self-pay | Admitting: Family Medicine

## 2016-05-18 ENCOUNTER — Ambulatory Visit (INDEPENDENT_AMBULATORY_CARE_PROVIDER_SITE_OTHER): Payer: Medicare Other | Admitting: Family Medicine

## 2016-05-18 ENCOUNTER — Other Ambulatory Visit: Payer: Self-pay | Admitting: Cardiology

## 2016-05-18 VITALS — BP 122/78 | HR 73 | Temp 99.7°F | Resp 16 | Ht 66.0 in | Wt 248.0 lb

## 2016-05-18 DIAGNOSIS — Z87440 Personal history of urinary (tract) infections: Secondary | ICD-10-CM | POA: Diagnosis not present

## 2016-05-18 DIAGNOSIS — R319 Hematuria, unspecified: Secondary | ICD-10-CM | POA: Diagnosis not present

## 2016-05-18 LAB — POCT URINALYSIS DIPSTICK
Bilirubin, UA: NEGATIVE
GLUCOSE UA: NEGATIVE
Ketones, UA: NEGATIVE
NITRITE UA: NEGATIVE
Protein, UA: 0.15
Spec Grav, UA: 1.02
UROBILINOGEN UA: NEGATIVE
pH, UA: 6

## 2016-05-18 MED ORDER — CIPROFLOXACIN HCL 250 MG PO TABS: 250.0000 mg | ORAL_TABLET | Freq: Two times a day (BID) | ORAL | 0 refills | Status: DC

## 2016-05-18 NOTE — Patient Instructions (Signed)

## 2016-05-18 NOTE — Progress Notes (Signed)
Pre visit review using our clinic review tool, if applicable. No additional management support is needed unless otherwise documented below in the visit note. 

## 2016-05-18 NOTE — Progress Notes (Signed)
Patient ID: Heidi Stark, female    DOB: 07-Mar-1941  Age: 75 y.o. MRN: [id number]    Subjective:  Subjective  HPI Heidi Stark presents for f/u UTI.  Pt is still seeing blood in her urine.  No frequency or urgency.    Review of Systems  Constitutional: Negative for activity change, appetite change, chills and fever.  Gastrointestinal: Negative for abdominal distention and abdominal pain.  Genitourinary: Positive for hematuria. Negative for difficulty urinating, dyspareunia, dysuria, flank pain, frequency, genital sores, menstrual problem, pelvic pain, urgency, vaginal discharge and vaginal pain.  Musculoskeletal: Negative for back pain.    History Past Medical History:  Diagnosis Date  . COPD (chronic obstructive pulmonary disease) ([place])   . Diabetes mellitus    Type II  . DVT (deep venous thrombosis) ([place])   . Hyperlipidemia   . Hypertension   . PAF (paroxysmal atrial fibrillation) ([place])     She has a past surgical history that includes Tubal ligation; Knee surgery; Cystectomy; Back surgery; Ganglion cyst excision; TEE without cardioversion (N/A, 10/31/2013); and Colonoscopy.   Her family history includes Factor V Leiden deficiency in her daughter.She reports that she has been smoking Cigarettes.  She has been smoking about 0.50 packs per day. She has never used smokeless tobacco. She reports that she does not drink alcohol or use drugs.  Current Outpatient Prescriptions on File Prior to Visit  Medication Sig Dispense Refill  . ADVAIR DISKUS 250-50 MCG/DOSE AEPB INHALE 1 PUFF INTO THE LUNGS EVERY 12 (TWELVE) HOURS. AS NEEDED FOR SHORTNESS OF BREATH 60 each 2  . albuterol (PROVENTIL HFA;VENTOLIN HFA) 108 (90 Base) MCG/ACT inhaler Inhale 2 puffs into the lungs every 6 (six) hours as needed for wheezing or shortness of breath. 1 Inhaler 1  . apixaban (ELIQUIS) 5 MG TABS tablet Take 1 tablet (5 mg total) by mouth 2 (two) times daily. 180 tablet 3  . BD PEN NEEDLE NANO U/F 32G X 4  MM MISC See admin instructions.  6  . fenofibrate 160 MG tablet TAKE 1 TABLET (160 MG TOTAL) BY MOUTH DAILY. 90 tablet 1  . furosemide (LASIX) 20 MG tablet TAKE 1 TABLET BY MOUTH TWICE A DAY 180 tablet 1  . glimepiride (AMARYL) 2 MG tablet Take 1 tablet (2 mg total) by mouth daily with breakfast.    . glucose blood test strip Check Blood Sugars four time daily 100 each 2  . insulin glargine (LANTUS) 100 UNIT/ML injection Inject 0.15 mLs (15 Units total) into the skin at bedtime. 10 units qhs x 30 days 10 mL   . KLOR-CON 10 10 MEQ tablet TAKE 1 TABLET (10 MEQ TOTAL) BY MOUTH 2 (TWO) TIMES DAILY. 180 tablet 1  . lisinopril (PRINIVIL,ZESTRIL) 20 MG tablet Take 1 tablet (20 mg total) by mouth daily. 90 tablet 3  . metFORMIN (GLUCOPHAGE) 1000 MG tablet Take 1 tablet (1,000 mg total) by mouth 2 (two) times daily with a meal.    . metoprolol tartrate (LOPRESSOR) 25 MG tablet TAKE 0.5 TABLETS (12.5 MG TOTAL) BY MOUTH 2 (TWO) TIMES DAILY. 90 tablet 1  . rosuvastatin (CRESTOR) 10 MG tablet Take 1 tablet (10 mg total) by mouth daily.     No current facility-administered medications on file prior to visit.      Objective:  Objective  Physical Exam  Constitutional: She is oriented to person, place, and time. She appears well-developed and well-nourished.  HENT:  Head: Normocephalic and atraumatic.  Eyes: Conjunctivae and EOM are normal.  Neck: Normal range of motion. Neck supple. No JVD present. Carotid bruit is not present. No thyromegaly present.  Cardiovascular: Normal rate, regular rhythm and normal heart sounds.   No murmur heard. Pulmonary/Chest: Effort normal and breath sounds normal. No respiratory distress. She has no wheezes. She has no rales. She exhibits no tenderness.  Musculoskeletal: She exhibits no edema.  Neurological: She is alert and oriented to person, place, and time.  Psychiatric: She has a normal mood and affect. Her behavior is normal. Judgment and thought content normal.    Nursing note and vitals reviewed.  BP 122/78 (BP Location: Left Arm, Patient Position: Sitting, Cuff Size: Large)   Pulse 73   Temp 99.7 F (37.6 C) (Oral)   Resp 16   Ht 5\' 6"  (1.676 m)   Wt 248 lb (112.5 kg)   SpO2 95%   BMI 40.03 kg/m  Wt Readings from Last 3 Encounters:  05/18/16 248 lb (112.5 kg)  05/11/16 248 lb 9.6 oz (112.8 kg)  10/11/15 250 lb 6.4 oz (113.6 kg)     Lab Results  Component Value Date   WBC 9.7 11/30/2014   HGB 13.7 11/30/2014   HCT 41.2 11/30/2014   PLT 185.0 11/30/2014   GLUCOSE 112 (H) 10/11/2015   CHOL 112 10/11/2015   TRIG 187.0 (H) 10/11/2015   HDL 28.90 (L) 10/11/2015   LDLDIRECT 42.0 11/30/2014   LDLCALC 46 10/11/2015   ALT 18 10/11/2015   AST 23 10/11/2015   NA 141 10/11/2015   K 4.3 10/11/2015   CL 102 10/11/2015   CREATININE 0.56 10/11/2015   BUN 18 10/11/2015   CO2 32 10/11/2015   TSH 3.30 11/07/2007   INR 2.7 03/09/2014   HGBA1C 7.7 (H) 10/11/2015   MICROALBUR 9.3 (H) 11/30/2014    Ct Abdomen Pelvis W Contrast  Result Date: 11/24/2014 CLINICAL DATA:  Diarrhea for 3 days. Abdominal pain. Primary care physician sent the patient to the ER. EXAM: CT ABDOMEN AND PELVIS WITH CONTRAST TECHNIQUE: Multidetector CT imaging of the abdomen and pelvis was performed using the standard protocol following bolus administration of intravenous contrast. CONTRAST:  36mL OMNIPAQUE IOHEXOL 300 MG/ML SOLN, 140mL OMNIPAQUE IOHEXOL 300 MG/ML SOLN COMPARISON:  10/28/2013 FINDINGS: Atelectasis in the lung bases. Cardiac enlargement. Calcification in the mitral valve annulus. 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 The liver, gallbladder, pancreas, inferior vena cava, and retroperitoneal lymph nodes are unremarkable. Enlarged left adrenal gland similar to prior study, probably representing adenoma or hyperplasia. Small low-attenuation lesions in the kidneys too small to characterize but likely representing cysts.  Focal areas of scarring in both kidneys. No hydronephrosis. Calcification of abdominal aorta without aneurysm. Stomach and small bowel are not abnormally distended. No free air or free fluid in the abdomen. Small right anterior abdominal wall hernia containing fat. Pelvis: There is diffuse wall thickening demonstrated in the right colon and transverse colon. This is most consistent with inflammatory process such as colitis. Etiology is indeterminate in this could represent infectious or inflammatory process. Appendix is normal. Bladder wall is not thickened. Uterus and ovaries are not enlarged. No free or loculated pelvic fluid collections. No pelvic mass or lymphadenopathy. Degenerative changes in the spine. Degenerative changes in the hips. IMPRESSION: Colonic wall thickening most likely to represent colitis of nonspecific etiology. No evidence of bowel obstruction. Scarring in the kidneys and spleen. Electronically Signed   By: Lucienne Capers M.D.   On: 11/24/2014 00:12  Assessment & Plan:  Plan  I am having Heidi Stark start on ciprofloxacin. I am also having her maintain her glucose blood, BD PEN NEEDLE NANO U/F, metoprolol tartrate, rosuvastatin, metFORMIN, lisinopril, insulin glargine, glimepiride, fenofibrate, albuterol, ADVAIR DISKUS, apixaban, KLOR-CON 10, and furosemide.  Meds ordered this encounter  Medications  . ciprofloxacin (CIPRO) 250 MG tablet    Sig: Take 1 tablet (250 mg total) by mouth 2 (two) times daily.    Dispense:  10 tablet    Refill:  0    Problem List Items Addressed This Visit    None    Visit Diagnoses    Hematuria, unspecified type    -  Primary   Relevant Medications   ciprofloxacin (CIPRO) 250 MG tablet   Other Relevant Orders   POCT Urinalysis Dipstick (Completed)   History of UTI       Relevant Medications   ciprofloxacin (CIPRO) 250 MG tablet      Follow-up: Return in about 10 days (around 05/28/2016), or lab appoint for urine recheck .  Ann Held, DO

## 2016-05-19 NOTE — Telephone Encounter (Signed)
Pt seen 05/18/16 by PCP.

## 2016-05-21 ENCOUNTER — Emergency Department (HOSPITAL_BASED_OUTPATIENT_CLINIC_OR_DEPARTMENT_OTHER)
Admission: EM | Admit: 2016-05-21 | Discharge: 2016-05-21 | Disposition: A | Discharge status: Home / Self Care | Payer: Medicare Other | Attending: Emergency Medicine | Admitting: Emergency Medicine

## 2016-05-21 ENCOUNTER — Encounter (HOSPITAL_BASED_OUTPATIENT_CLINIC_OR_DEPARTMENT_OTHER): Payer: Self-pay | Admitting: Emergency Medicine

## 2016-05-21 DIAGNOSIS — Z794 Long term (current) use of insulin: Secondary | ICD-10-CM | POA: Insufficient documentation

## 2016-05-21 DIAGNOSIS — F17210 Nicotine dependence, cigarettes, uncomplicated: Secondary | ICD-10-CM | POA: Diagnosis not present

## 2016-05-21 DIAGNOSIS — I10 Essential (primary) hypertension: Secondary | ICD-10-CM | POA: Insufficient documentation

## 2016-05-21 DIAGNOSIS — Z79899 Other long term (current) drug therapy: Secondary | ICD-10-CM | POA: Insufficient documentation

## 2016-05-21 DIAGNOSIS — E119 Type 2 diabetes mellitus without complications: Secondary | ICD-10-CM | POA: Insufficient documentation

## 2016-05-21 DIAGNOSIS — Z7984 Long term (current) use of oral hypoglycemic drugs: Secondary | ICD-10-CM | POA: Insufficient documentation

## 2016-05-21 DIAGNOSIS — L509 Urticaria, unspecified: Secondary | ICD-10-CM | POA: Diagnosis not present

## 2016-05-21 DIAGNOSIS — J449 Chronic obstructive pulmonary disease, unspecified: Secondary | ICD-10-CM | POA: Insufficient documentation

## 2016-05-21 NOTE — ED Notes (Signed)
Pt given d/c instructions as per chart. Verbalizes understanding. No questions. 

## 2016-05-21 NOTE — ED Provider Notes (Signed)
[place] DEPT [facility] Provider Note   CSN: [id number] Arrival date & time: 05/21/16  1508  By signing my name below, I, Arianna Nassar, attest that this documentation has been prepared under the direction and in the presence of Fatima Blank, MD.  Electronically Signed: Julien Nordmann, ED Scribe. 05/21/16. 3:27 PM.    History   Chief Complaint Chief Complaint  Patient presents with  . Urticaria    The history is provided by the patient. No language interpreter was used.   HPI Comments: Heidi Stark is a 75 y.o. female who has a PMhx of DMII, HLD, HTN, PAF, COPD presents to the Emergency Department complaining of sudden onset, moderate erythematous hives to her bilateral upper and lower extremities that started yesterday morning. She describes the rash as itchy. Pt was diagnosed with a UTI on 10/26 and was prescribed Macrobid. She notes that her symptoms of hematuria did not improved so her PCP changed her antibiotic to Cipro on 11/02 and her hives started yesterday. Pt has not taken anymore Cipro since her hives started yesterday. She has taken Benadryl q.6h to help with her uritcaria with mild, temporarily relief. Pt currently takes Eliquis. Denies swelling in throat, facial swelling, shortness of breath, trouble swallowing, wheezing, coughing, nausea, vomiting, fever, fever, dysuria, or recent infection.    PCP: Ann Held, DO  Past Medical History:  Diagnosis Date  . COPD (chronic obstructive pulmonary disease) ([place])   . Diabetes mellitus    Type II  . DVT (deep venous thrombosis) ([place])   . Hyperlipidemia   . Hypertension   . PAF (paroxysmal atrial fibrillation) [facility])     Patient Active Problem List   Diagnosis Date Noted  . Colitis 11/24/2014  . UTI (urinary tract infection) 11/24/2014  . Acute UTI   . Diarrhea 11/23/2014  . Skin infection 10/29/2014  . Severe obesity (BMI >= 40) ([place]) 11/12/2013  . PAF (paroxysmal atrial fibrillation) ([place])  11/03/2013  . 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 Cellulitis and abscess of buttock 04/16/2013  . COLONIC POLYPS 08/16/2010  . PAD (peripheral artery disease) ([place]) 08/15/2010  . SINUSITIS - ACUTE-NOS 04/18/2010  . HLD (hyperlipidemia) 11/18/2009  . MYALGIA 11/18/2009  . CHRONIC OBSTRUCTIVE PULMONARY DISEASE, ACUTE EXACERBATION 07/30/2008  . OBESITY 05/14/2007  . Peripheral vascular disease ([place]) 05/14/2007  . PULMONARY NODULE, RIGHT MIDDLE LOBE 05/14/2007  . Type II diabetes mellitus, well controlled ([place]) 03/20/2007  . OBSTRUCTIVE SLEEP APNEA 12/06/2006  . Essential hypertension 12/06/2006  . COPD (chronic obstructive pulmonary disease) ([place]) 12/06/2006    Past Surgical History:  Procedure Laterality Date  . BACK SURGERY     vein graft and colon repair after nicked artery with back surgert  . COLONOSCOPY    . CYSTECTOMY     between bladder and kidneys  . GANGLION CYST EXCISION    . KNEE SURGERY     Right Knee cart.Removal  . TEE WITHOUT CARDIOVERSION N/A 10/31/2013   Procedure: TRANSESOPHAGEAL ECHOCARDIOGRAM (TEE);  Surgeon: Larey Dresser, MD;  Location: [facility];  Service: Cardiovascular;  Laterality: N/A;  . TUBAL LIGATION      OB History    No data available       Home Medications    Prior to Admission medications   Medication Sig Start Date End Date Taking? Authorizing Provider  ADVAIR DISKUS 250-50 MCG/DOSE AEPB INHALE 1 PUFF INTO THE LUNGS EVERY 12 (TWELVE) HOURS. AS NEEDED FOR SHORTNESS OF BREATH 10/25/15  Rosalita Chessman Chase, DO  albuterol (PROVENTIL HFA;VENTOLIN HFA) 108 (90 Base) MCG/ACT inhaler Inhale 2 puffs into the lungs every 6 (six) hours as needed for wheezing or shortness of breath. 10/11/15   Rosalita Chessman Chase, DO  apixaban (ELIQUIS) 5 MG TABS tablet Take 1 tablet (5 mg total) by mouth 2 (two) times daily. 10/28/15   Larey Dresser, MD  BD PEN NEEDLE NANO U/F 32G X 4 MM MISC See admin instructions. 08/16/15    Historical Provider, MD  fenofibrate 160 MG tablet TAKE 1 TABLET (160 MG TOTAL) BY MOUTH DAILY. 10/11/15   Rosalita Chessman Chase, DO  furosemide (LASIX) 20 MG tablet TAKE 1 TABLET BY MOUTH TWICE A DAY 12/14/15   [name] Lowne Chase, DO  glimepiride (AMARYL) 2 MG tablet Take 1 tablet (2 mg total) by mouth daily with breakfast. 10/11/15   Rosalita Chessman Chase, DO  glucose blood test strip Check Blood Sugars four time daily 03/21/12   Rosalita Chessman Chase, DO  insulin glargine (LANTUS) 100 UNIT/ML injection Inject 0.15 mLs (15 Units total) into the skin at bedtime. 10 units qhs x 30 days 10/11/15   [name] Lowne Chase, DO  KLOR-CON 10 10 MEQ tablet TAKE 1 TABLET (10 MEQ TOTAL) BY MOUTH 2 (TWO) TIMES DAILY. 12/14/15   Rosalita Chessman Chase, DO  lisinopril (PRINIVIL,ZESTRIL) 20 MG tablet Take 1 tablet (20 mg total) by mouth daily. 10/11/15   Rosalita Chessman Chase, DO  lisinopril (PRINIVIL,ZESTRIL) 20 MG tablet Take 1 tablet (20 mg total) by mouth daily. PLEASE CONTACT OFFICE TO SCHEDULE APPT [phone] - 1st attempt 05/18/16   Larey Dresser, MD  metFORMIN (GLUCOPHAGE) 1000 MG tablet Take 1 tablet (1,000 mg total) by mouth 2 (two) times daily with a meal. 10/11/15   Rosalita Chessman Chase, DO  metoprolol tartrate (LOPRESSOR) 25 MG tablet TAKE 0.5 TABLETS (12.5 MG TOTAL) BY MOUTH 2 (TWO) TIMES DAILY. 10/11/15   Rosalita Chessman Chase, DO  rosuvastatin (CRESTOR) 10 MG tablet Take 1 tablet (10 mg total) by mouth daily. 10/11/15   Ann Held, DO    Family History Family History  Problem Relation Age of Onset  . Diabetes    . Melanoma    . Factor V Leiden deficiency Daughter     Social History Social History  Substance Use Topics  . Smoking status: Current Every Day Smoker    Packs/day: 0.50    Types: Cigarettes    Last attempt to quit: 09/14/2005  . Smokeless tobacco: Never Used  . Alcohol use No     Allergies   Sulfonamide derivatives and Neomycin-bacitracin zn-polymyx   Review of  Systems Review of Systems  All other systems reviewed and are negative.   A complete 10 system review of systems was obtained and all systems are negative except as noted in the HPI and PMH.    Physical Exam Updated Vital Signs BP 163/86   Pulse 85   Temp 98.1 F (36.7 C)   Resp 20   Ht 5\' 6"  (1.676 m)   Wt 248 lb (112.5 kg)   SpO2 95%   BMI 40.03 kg/m   Physical Exam  Constitutional: She is oriented to person, place, and time. She appears well-developed and well-nourished. No distress.  HENT:  Head: Normocephalic and atraumatic.  Nose: Nose normal.  No swelling or edema of the throat  Eyes: Conjunctivae and EOM are normal. Pupils are equal, round, and  reactive to light. Right eye exhibits no discharge. Left eye exhibits no discharge. No scleral icterus.  Neck: Normal range of motion. Neck supple.  Cardiovascular: Normal rate and regular rhythm.  Exam reveals no gallop and no friction rub.   No murmur heard. Pulmonary/Chest: Effort normal and breath sounds normal. No stridor. No respiratory distress. She has no rales.  Abdominal: Soft. She exhibits no distension. There is no tenderness.  Musculoskeletal: She exhibits no edema or tenderness.  Neurological: She is alert and oriented to person, place, and time.  Skin: Skin is warm and dry. Rash noted. She is not diaphoretic. No erythema.  Blanching urticaria on bilateral upper and lower extremities  Psychiatric: She has a normal mood and affect.  Nursing note and vitals reviewed.    ED Treatments / Results  DIAGNOSTIC STUDIES: Oxygen Saturation is 95% on RA, adequate by my interpretation.  COORDINATION OF CARE:  3:23 PM Discussed treatment plan which includes continue taking benadryl with pt at bedside and pt agreed to plan.  Labs (all labs ordered are listed, but only abnormal results are displayed) Labs Reviewed - No data to display  EKG  EKG Interpretation None       Radiology No results  found.  Procedures Procedures (including critical care time)  Medications Ordered in ED Medications - No data to display   Initial Impression / Assessment and Plan / ED Course  I have reviewed the triage vital signs and the nursing notes.  Pertinent labs & imaging results that were available during my care of the patient were reviewed by me and considered in my medical decision making (see chart for details).  Clinical Course     75 y.o. female here with pruritic rash. Likely secondary to ciprofloxacin. No respiratory, GI, or neurologic symptoms to suggest anaphylaxis. no recent infectious symptoms suggestive of viral urticaria.  Patient has been taking benadryl with transient improvement   On exam, there is no evidence of oral swelling or airway compromise.   No indication for steroids at this time.  Safe for discharge with strict return precautions. Close PCP follow-up.   Final Clinical Impressions(s) / ED Diagnoses   Final diagnoses:  Urticaria   I personally performed the services described in this documentation, which was scribed in my presence. The recorded information has been reviewed and is accurate.    Disposition: Discharge  Condition: Good  I have discussed the results, Dx and Tx plan with the patient who expressed understanding and agree(s) with the plan. Discharge instructions discussed at great length. The patient was given strict return precautions who verbalized understanding of the instructions. No further questions at time of discharge.    Discharge Medication List as of 05/21/2016  3:30 PM      Follow Up: Ann Held, DO [place] [address] [place] [id number] [phone]  Call on 05/22/2016       Fatima Blank, MD 05/22/16 [phone]

## 2016-05-21 NOTE — ED Triage Notes (Signed)
Pt in c/o hives developing yesterday after PCP changed her abx for a UTI. Taking benadryl at home. Pt alert, interactive, ambulatory in NAD.

## 2016-05-21 NOTE — ED Notes (Signed)
Pt states she started taking Clindamycin on Thursday and developed itching and welts yesterday. Denies [facility] or other s/s.

## 2016-05-22 ENCOUNTER — Telehealth: Payer: Self-pay | Admitting: Family Medicine

## 2016-05-22 NOTE — Telephone Encounter (Signed)
Patient was prescribed an antibiotic on 05/18/16 when she was seen for a kidney infection. Patient states that she had an allergic reaction and went to the ED yesterday. They took her off that medication in the ED patient would now like to know what she needs to do for the infection? Please advise.   Patient phone: [phone]

## 2016-05-22 NOTE — Telephone Encounter (Signed)
Please advise. Cipro dc'd by ED, but no alternative medication prescribed. I did add cipro to pt's allergy list.

## 2016-05-23 ENCOUNTER — Encounter: Payer: Self-pay | Admitting: Family Medicine

## 2016-05-23 ENCOUNTER — Telehealth: Payer: Self-pay

## 2016-05-23 MED ORDER — NITROFURANTOIN MONOHYD MACRO 100 MG PO CAPS: 100.0000 mg | ORAL_CAPSULE | Freq: Two times a day (BID) | ORAL | 0 refills | Status: DC

## 2016-05-23 NOTE — Telephone Encounter (Signed)
Checked with lab.  Urine was not processed for culture. A urine sample needs to be recollected for culture.

## 2016-05-23 NOTE — Telephone Encounter (Signed)
Please discuss with patient.

## 2016-05-23 NOTE — Telephone Encounter (Signed)
Patient is addiment that she speaks to you even through mychart about why she does not have a culture done. She states that Dr. [name] told her she would be getting one done and has not gotten the results for it. Please advise. PC

## 2016-05-23 NOTE — Telephone Encounter (Signed)
Discussed with patient. She was upset, but stated understanding.  She has agreed to come back in the morning for recollection.

## 2016-05-23 NOTE — Telephone Encounter (Signed)
Patient has been made aware of the issue. I apologized to her and let her know that her new rx has been sent to her pharmacy. Patient was a little upset about the time that it has taken to get back with her, I apologized to her and let her know that hopefully it doesn't happen again, with the updates thing happen out of our control. Patient was also made aware that she needs to come in 10-14 days for a repeat UA and culture  Patient voiced her understanding. PC

## 2016-05-23 NOTE — Telephone Encounter (Signed)
I responded to this at least 2 x yesterday--- we are having a problem with my chart I was hoping ER sent a culture but they did mot -----  macrobid 1 po bid x 7 days  Repeat UA and culture in 10-14 days --- order is in

## 2016-05-23 NOTE — Telephone Encounter (Signed)
Culture was ordered the day she was here--- pt it does not look like they sent it out Her urine results are not in there either and I know that was done.

## 2016-05-24 ENCOUNTER — Other Ambulatory Visit: Payer: Medicare Other

## 2016-05-25 ENCOUNTER — Encounter: Payer: Self-pay | Admitting: Family Medicine

## 2016-05-25 NOTE — Telephone Encounter (Signed)
Urine culture currently in process.

## 2016-05-26 ENCOUNTER — Telehealth: Payer: Self-pay | Admitting: Family Medicine

## 2016-05-26 LAB — URINE CULTURE: Organism ID, Bacteria: NO GROWTH

## 2016-05-26 NOTE — Telephone Encounter (Signed)
Caller name: Relationship to patient: Self Can be reached: [phone] Pharmacy:  Reason for call: Patient states that Benadryl is not helping her hives and she needs to know what else she can do. Plse adv

## 2016-05-26 NOTE — Telephone Encounter (Signed)
Spoke with pt she stated that the hives that she has on both legs and arms are from the medication that she took last Friday.  PC

## 2016-05-27 ENCOUNTER — Ambulatory Visit (INDEPENDENT_AMBULATORY_CARE_PROVIDER_SITE_OTHER): Payer: Medicare Other | Admitting: Family Medicine

## 2016-05-27 ENCOUNTER — Telehealth: Payer: Self-pay | Admitting: General Practice

## 2016-05-27 ENCOUNTER — Encounter: Payer: Self-pay | Admitting: Family Medicine

## 2016-05-27 VITALS — BP 134/81 | HR 83 | Temp 98.2°F | Resp 17 | Ht 66.0 in | Wt 249.0 lb

## 2016-05-27 DIAGNOSIS — L509 Urticaria, unspecified: Secondary | ICD-10-CM

## 2016-05-27 NOTE — Progress Notes (Signed)
Pre visit review using our clinic review tool, if applicable. No additional management support is needed unless otherwise documented below in the visit note. 

## 2016-05-27 NOTE — Patient Instructions (Signed)
Consider over the counter anti-histamine such as Xyzal Consider adding either Pepcid or Zantac twice daily for hives. Follow up with your primary if they persist.

## 2016-05-27 NOTE — Telephone Encounter (Signed)
Patient was seen in Saturday clinic and wanted me to reach out the her PCP office for her.   Pt advised that she called the office on Friday at 10am and never heard back from anyone, in regards to an ongoing issue with her hives. Pt called back to the office at 3:30 and was told that they would find another office for her to be seen at and she was placed on the Saturday Clinic schedule. (phone note had not been routed to anyone)  Pt would like a phone call back from team Lead or Office Manager to discuss.

## 2016-05-27 NOTE — Progress Notes (Signed)
Subjective:     Patient ID: Heidi Stark, female   DOB: 1941-07-15, 75 y.o.   MRN: [id number]  HPI  Acute Saturday AM clinic visit. Patient seen for evaluation of hives. She's had similar issues in the past. She states her current hives started about a week ago. Her recent history is that she was seen and placed on Cipro about 9 days ago for UTI. One week ago noted onset of diffuse hives and she went to urgent care center on Sunday and was told to take Benadryl. Tuesday she was reevaluated and started on Macrobid for some persistent dysuria. Her hives have persisted on her extremities- especially legs and arms. Relative sparing of the trunk. She's not had any dyspnea. No wheezing. No recent changes in soap or detergent. No known food allergies. Currently using Benadryl.  Past Medical History:  Diagnosis Date  . COPD (chronic obstructive pulmonary disease) ([place])   . Diabetes mellitus    Type II  . DVT (deep venous thrombosis) ([place])   . Hyperlipidemia   . Hypertension   . PAF (paroxysmal atrial fibrillation) ([place])    Past Surgical History:  Procedure Laterality Date  . BACK SURGERY     vein graft and colon repair after nicked artery with back surgert  . COLONOSCOPY    . CYSTECTOMY     between bladder and kidneys  . GANGLION CYST EXCISION    . KNEE SURGERY     Right Knee cart.Removal  . TEE WITHOUT CARDIOVERSION N/A 10/31/2013   Procedure: TRANSESOPHAGEAL ECHOCARDIOGRAM (TEE);  Surgeon: Larey Dresser, MD;  Location: [facility];  Service: Cardiovascular;  Laterality: N/A;  . TUBAL LIGATION      reports that she has been smoking Cigarettes.  She has been smoking about 0.50 packs per day. She has never used smokeless tobacco. She reports that she does not drink alcohol or use drugs. family history includes Factor V Leiden deficiency in her daughter. Allergies  Allergen Reactions  . Sulfonamide Derivatives     Stomach cramps  . Neomycin-Bacitracin Zn-Polymyx Rash  . Ciprofloxacin  Hives, Itching and Rash     Review of Systems  Constitutional: Negative for chills and fever.       Objective:   Physical Exam  Constitutional: She appears well-developed and well-nourished.  Cardiovascular: Normal rate.   Pulmonary/Chest: Effort normal and breath sounds normal. No respiratory distress. She has no wheezes. She has no rales.  Skin: Rash noted. No erythema.  Patient has several slightly raised erythematous urticarial lesions especially on her legs and these extend up to her distal thigh bilaterally. She has lesser involvement of her forearms and arms. No angioedema changes       Assessment:     Hives possibly related to recent antibiotic with Cipro    Plan:     -We'll try to avoid steroids because of her type 2 diabetes -Consider over-the-counter Xyzal and also with consider over-the-counter Pepcid or Zantac -we explained these can sometimes take weeks to resolve.  Eulas Post MD [facility]

## 2016-05-29 ENCOUNTER — Telehealth: Payer: Self-pay

## 2016-05-29 NOTE — Telephone Encounter (Addendum)
Called to follow up with patient.  Pt states she is very disappointed in her experience with our office lately.  States the communication has been horrible.  She called early Friday morning and talked to [place].  She didn't receive a response until she called back later that day.  She spoke with Langley Gauss and Langley Gauss transferred her to a nurse.  She spoke with the nurse and the nurse said "there is nothing we can do for you now but get you an appt with Saturday clinic."  When she saw the doctor on Saturday, she said he was surprised that patient had not been seen sooner.  Desmond Dike informed her that even the note that was placed in the chart on Friday never reached the provider.  She said office has to have better communication.  She says that messages are sent and she can't get a response and she's unhappy with that.  She is considering leaving the practice.  She said this never happened when Maudie Mercury was here.  She says she has an appt with [facility] on Wednesday and needs all recent office notes to be sent to the office by tomorrow.  She said a MyChart message was sent on the 05/25/16 and she never received a response about it.  She says she plans to talk to Dr. 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  My apologizes were offered for the poor experience she has received.  I also made her aware that I would make Dr. [name] aware of concerns, and that I would ensure that her records are faxed over to [facility].

## 2016-05-29 NOTE — Telephone Encounter (Signed)
Noted--- routed to [place], ebony and ashlee We need to find out where the break down was

## 2016-05-29 NOTE — Telephone Encounter (Signed)
Printed results and faxed to [facility] per patient request.

## 2016-05-29 NOTE — Progress Notes (Signed)
Printed and sent letter with results to patient. LB 

## 2016-06-01 ENCOUNTER — Telehealth: Payer: Self-pay

## 2016-06-01 NOTE — Telephone Encounter (Signed)
Spoke with patient to follow up on last visit. Patient states she is feeling much better and she has been seen by her urologist yesterday. Patient states if there are any changes she will call the office and give [name] an update. LB

## 2016-06-12 ENCOUNTER — Other Ambulatory Visit: Payer: Self-pay | Admitting: Family Medicine

## 2016-06-13 ENCOUNTER — Other Ambulatory Visit: Payer: Self-pay | Admitting: Cardiology

## 2016-06-19 ENCOUNTER — Telehealth: Payer: Self-pay | Admitting: Family Medicine

## 2016-06-19 NOTE — Telephone Encounter (Signed)
Caller name: Jaylen Appler Relationship to patient: self Can be reached: [phone]  Reason for call: Pt states someone called for her Friday 12/1 and told her husband they would call back Friday afternoon. Pt did not hear from anyone. No name was left. She does not know what it was about. Pt stated issues over the last 3 weeks with our office and she does not understand what has changed. Note on Jordan's desk to call pt as pt states previous conversation with [place] about concerns.

## 2016-06-23 NOTE — Telephone Encounter (Signed)
Patient was contacted for AMW visits, spoke with patient and explained as well as answered other questions.

## 2016-06-26 ENCOUNTER — Telehealth: Payer: Self-pay | Admitting: Physician Assistant

## 2016-06-26 ENCOUNTER — Encounter: Payer: Self-pay | Admitting: Physician Assistant

## 2016-06-26 ENCOUNTER — Ambulatory Visit (INDEPENDENT_AMBULATORY_CARE_PROVIDER_SITE_OTHER): Payer: Medicare Other | Admitting: Physician Assistant

## 2016-06-26 VITALS — BP 140/80 | HR 69 | Ht 66.0 in | Wt 249.1 lb

## 2016-06-26 DIAGNOSIS — I700 Atherosclerosis of aorta: Secondary | ICD-10-CM | POA: Insufficient documentation

## 2016-06-26 DIAGNOSIS — I480 Paroxysmal atrial fibrillation: Secondary | ICD-10-CM | POA: Diagnosis not present

## 2016-06-26 DIAGNOSIS — Z720 Tobacco use: Secondary | ICD-10-CM | POA: Insufficient documentation

## 2016-06-26 DIAGNOSIS — I10 Essential (primary) hypertension: Secondary | ICD-10-CM | POA: Diagnosis not present

## 2016-06-26 DIAGNOSIS — E7800 Pure hypercholesterolemia, unspecified: Secondary | ICD-10-CM

## 2016-06-26 NOTE — Telephone Encounter (Signed)
Tried to return pt's call but no answer.

## 2016-06-26 NOTE — Patient Instructions (Addendum)
Medication Instructions:  Your physician recommends that you continue on your current medications as directed. Please refer to the Current Medication list given to you today.  Labwork: NONE  Testing/Procedures: NONE  Follow-Up: SCOTT WEAVER, PAC ON 12/25/16 @ 10:45   Any Other Special Instructions Will Be Listed Below (If Applicable).  If you need a refill on your cardiac medications before your next appointment, please call your pharmacy.

## 2016-06-26 NOTE — Progress Notes (Signed)
Cardiology Office Note:    Date:  06/26/2016   ID:  Heidi Stark, DOB 08-25-1940, MRN [id number]  PCP:  Ann Held, DO  Cardiologist:  Dr. [name]   Electrophysiologist:  N/a Urologist: Dr. McDiarmid  Referring MD: [name], [name], *   Chief Complaint  Patient presents with  . Follow-up    Atrial fibrillation    History of Present Illness:    Heidi Stark is a 75 y.o. female with a hx of paroxysmal atrial fibrillation, HTN, HL, diabetes. She was admitted in 4/15 with splenic and renal infarcts and found to have paroxysmal age of fibrillation. TEE showed prominent aortic plaque. She was placed on Coumadin. She was eventually changed over to Eliquis. Event monitor in 4/15 showed no atrial fibrillation. 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 Last seen by Dr. [name] 04/2015.    She returns for follow-up. She did have an episode of hematuria recently. She saw her urologist. She has a kidney stone that is slowly being passed. She denies chest discomfort, dyspnea, syncope, orthopnea, PND or edema.  Prior CV studies that were reviewed today include:    Event monitor 4/15 NSR, PACs  TEE 4/15 Mild LVH, EF 50-55, normal wall motion, normal caliber aorta with grade 3-4 plaque in the descending thoracic aorta, trivial MR, mild LAE, mild RAE, no right-to-left shunt  Echo 4/15 Moderate LVH, EF 50-55, normal wall motion, MAC, mild LAE, mild RAE  LHC 5/07 LM okay LAD luminal irregularities LCx okay RCA ostial spasm otherwise okay Preserved LVEF  Past Medical History:  Diagnosis Date  . COPD (chronic obstructive pulmonary disease) ([place])   . Diabetes mellitus    Type II  . DVT (deep venous thrombosis) ([place])   . Hyperlipidemia   . Hypertension   . PAF (paroxysmal atrial fibrillation) ([place])   1.Type II diabetes 2. HTN 3. Hyperlipidemia 4. COPD 5. LHC (2007)  reportedly normal.  6. Paroxysmal atrial fibrillation: First noted in 4/15.  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  TEE (4/15) with EF 50-55%, mild LVH, grade III-IV plaque in descending thoracic aorta, RV normal, no PFO, peak RV-RA gradient 42 mmHg.  Event monitor (4/15) with no atrial fibrillation.  7. H/o diskectomy. 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 Lower extremity arterial dopplers (5/15) with occluded left external iliac artery.   Past Surgical History:  Procedure Laterality Date  . BACK SURGERY     vein graft and colon repair after nicked artery with back surgert  . COLONOSCOPY    . CYSTECTOMY     between bladder and kidneys  . GANGLION CYST EXCISION    . KNEE SURGERY     Right Knee cart.Removal  . TEE WITHOUT CARDIOVERSION N/A 10/31/2013   Procedure: TRANSESOPHAGEAL ECHOCARDIOGRAM (TEE);  Surgeon: Larey Dresser, MD;  Location: [facility];  Service: Cardiovascular;  Laterality: N/A;  . TUBAL LIGATION      Current Medications: Current Meds  Medication Sig  . ADVAIR DISKUS 250-50 MCG/DOSE AEPB INHALE 1 PUFF INTO THE LUNGS EVERY 12 (TWELVE) HOURS. AS NEEDED FOR SHORTNESS OF BREATH  . albuterol (PROVENTIL HFA;VENTOLIN HFA) 108 (90 Base) MCG/ACT inhaler Inhale 2 puffs into the lungs every 6 (six) hours as needed for wheezing or shortness of breath.  Marland Kitchen apixaban (ELIQUIS) 5 MG TABS tablet Take 1 tablet (5 mg total) by  mouth 2 (two) times daily.  . BD PEN NEEDLE NANO U/F 32G X 4 MM MISC See admin instructions.  . fenofibrate 160 MG tablet TAKE 1 TABLET (160 MG TOTAL) BY MOUTH DAILY.  . furosemide (LASIX) 20 MG tablet TAKE 1 TABLET BY MOUTH TWICE A DAY  . glimepiride (AMARYL) 2 MG tablet Take 1 tablet (2 mg total) by mouth daily with breakfast.  . glucose blood test strip Check Blood Sugars four time daily  . insulin glargine (LANTUS) 100 UNIT/ML injection Inject 0.15 mLs (15 Units total) into the skin at bedtime.  10 units qhs x 30 days  . KLOR-CON 10 10 MEQ tablet TAKE 1 TABLET (10 MEQ TOTAL) BY MOUTH 2 (TWO) TIMES DAILY.  Marland Kitchen lisinopril (PRINIVIL,ZESTRIL) 20 MG tablet Take 1 tablet (20 mg total) by mouth daily. *Patient is overdue for an appointment and needs to call and schedule for further refills*  . metFORMIN (GLUCOPHAGE) 1000 MG tablet Take 1 tablet (1,000 mg total) by mouth 2 (two) times daily with a meal.  . metoprolol tartrate (LOPRESSOR) 25 MG tablet TAKE 0.5 TABLETS (12.5 MG TOTAL) BY MOUTH 2 (TWO) TIMES DAILY.  . nitrofurantoin, macrocrystal-monohydrate, (MACROBID) 100 MG capsule Take 1 capsule (100 mg total) by mouth 2 (two) times daily.  . rosuvastatin (CRESTOR) 10 MG tablet Take 1 tablet (10 mg total) by mouth daily.     Allergies:   Sulfonamide derivatives; Neomycin-bacitracin zn-polymyx; and Ciprofloxacin   Social History   Social History  . Marital status: Married    Spouse name: N/A  . Number of children: 2  . Years of education: N/A   Occupational History  . Retired    Social History Main Topics  . Smoking status: Current Every Day Smoker    Packs/day: 0.50    Types: Cigarettes    Last attempt to quit: 09/14/2005  . Smokeless tobacco: Never Used  . Alcohol use No  . Drug use: No  . Sexual activity: Not Asked   Other Topics Concern  . None   Social History Narrative  . None     Family History:  The patient's family history includes Factor V Leiden deficiency in her daughter.   ROS:   Please see the history of present illness.    ROS All other systems reviewed and are negative.   EKGs/Labs/Other Test Reviewed:    EKG:  EKG is  ordered today.  The ekg ordered today demonstrates NSR, HR 69, left axis deviation, nonspecific ST-T wave changes, QTc 450 ms, no change since prior tracing  Recent Labs: 10/11/2015: ALT 18; BUN 18; Creatinine, Ser 0.56; Potassium 4.3; Sodium 141   Recent Lipid Panel    Component Value Date/Time   CHOL 112 10/11/2015 1012   TRIG  187.0 (H) 10/11/2015 1012   HDL 28.90 (L) 10/11/2015 1012   CHOLHDL 4 10/11/2015 1012   VLDL 37.4 10/11/2015 1012   LDLCALC 46 10/11/2015 1012   LDLDIRECT 42.0 11/30/2014 1226     Physical Exam:    VS:  BP 140/80   Pulse 69   Ht 5\' 6"  (1.676 m)   Wt 249 lb 1.9 oz (113 kg)   SpO2 95%   BMI 40.21 kg/m     Wt Readings from Last 3 Encounters:  06/26/16 249 lb 1.9 oz (113 kg)  05/27/16 249 lb (112.9 kg)  05/21/16 248 lb (112.5 kg)     Physical Exam  Constitutional: She appears well-developed and well-nourished. No distress.  HENT:  Head: Normocephalic  and atraumatic.  Eyes: No scleral icterus.  Neck:  I cannot appreciate JVD  Cardiovascular: Normal rate and regular rhythm.   Murmur heard.  Low-pitched early systolic murmur is present with a grade of 1/6  at the upper right sternal border Pulmonary/Chest: Effort normal. She has no wheezes. She has no rales.  Abdominal: Soft. There is no tenderness. There is no rebound.  Musculoskeletal: She exhibits no edema.  Neurological: She is alert.  Skin: Skin is warm and dry.  Psychiatric: She has a normal mood and affect.    ASSESSMENT:    1. Paroxysmal atrial fibrillation (HCC)   2. Essential hypertension   3. Pure hypercholesterolemia   4. Atherosclerosis of aorta ([place])   5. Tobacco abuse    PLAN:    In order of problems listed above:  1. Paroxysmal atrial fibrillation - Maintaining NSR.  She is tolerating Eliquis.  She is having a hard time affording it due to being in the "donut hole."  We did provide samples today.  If expense is too great, will see about changing her to Xarelto.  She had recent labs with her urologist.  Will request BMET, CBC.    2. HTN - BP with borderline control.  Continue current regimen.  Continue to monitor.   3. HL - LDL in 3/17 was 46.  Continue statin.   4. Aortic atherosclerosis - Continue statin.  She is not on ASA as she is on Eliquis.    5. Tobacco abuse - She is trying to quit.     Medication Adjustments/Labs and Tests Ordered: Current medicines are reviewed at length with the patient today.  Concerns regarding medicines are outlined above.  Medication changes, Labs and Tests ordered today are outlined in the Patient Instructions noted below. Patient Instructions  Medication Instructions:  Your physician recommends that you continue on your current medications as directed. Please refer to the Current Medication list given to you today.  Labwork: NONE  Testing/Procedures: NONE  Follow-Up: SCOTT WEAVER, PAC ON 12/25/16 @ 10:45   Any Other Special Instructions Will Be Listed Below (If Applicable).  If you need a refill on your cardiac medications before your next appointment, please call your pharmacy.  Signed, Richardson Dopp, PA-C  06/26/2016 3:14 PM    [facility] [place], [place], [place]  [address] Phone: [phone]; Fax: [phone]

## 2016-06-26 NOTE — Progress Notes (Signed)
OK to see me eventually in CHF clinic.

## 2016-06-26 NOTE — Telephone Encounter (Signed)
Pt called to speak with Arbie Cookey states she is having her old lab work faxed to [name] today and there should be some more lab work from last month in [place] already

## 2016-07-06 ENCOUNTER — Telehealth: Payer: Self-pay | Admitting: Family Medicine

## 2016-07-06 NOTE — Telephone Encounter (Signed)
Patient will call back to schedule medicare wellness appointment patient states she's currently dealing with multiple doctors and will call back at the end of January to schedule.

## 2016-07-11 ENCOUNTER — Other Ambulatory Visit: Payer: Self-pay | Admitting: Cardiology

## 2016-08-08 ENCOUNTER — Other Ambulatory Visit: Payer: Self-pay | Admitting: Family Medicine

## 2016-08-09 ENCOUNTER — Emergency Department (HOSPITAL_COMMUNITY): Payer: Medicare Other

## 2016-08-09 ENCOUNTER — Encounter (HOSPITAL_COMMUNITY): Payer: Self-pay | Admitting: Emergency Medicine

## 2016-08-09 ENCOUNTER — Inpatient Hospital Stay (HOSPITAL_COMMUNITY)
Admission: EM | Admit: 2016-08-09 | Discharge: 2016-08-13 | DRG: 291 | Disposition: A | Discharge status: Home Health | Payer: Medicare Other | Attending: Internal Medicine | Admitting: Internal Medicine

## 2016-08-09 DIAGNOSIS — I482 Chronic atrial fibrillation, unspecified: Secondary | ICD-10-CM

## 2016-08-09 DIAGNOSIS — Z6838 Body mass index (BMI) 38.0-38.9, adult: Secondary | ICD-10-CM

## 2016-08-09 DIAGNOSIS — I110 Hypertensive heart disease with heart failure: Principal | ICD-10-CM | POA: Diagnosis present

## 2016-08-09 DIAGNOSIS — J441 Chronic obstructive pulmonary disease with (acute) exacerbation: Secondary | ICD-10-CM | POA: Diagnosis present

## 2016-08-09 DIAGNOSIS — Z882 Allergy status to sulfonamides status: Secondary | ICD-10-CM

## 2016-08-09 DIAGNOSIS — E876 Hypokalemia: Secondary | ICD-10-CM | POA: Diagnosis present

## 2016-08-09 DIAGNOSIS — I480 Paroxysmal atrial fibrillation: Secondary | ICD-10-CM | POA: Diagnosis present

## 2016-08-09 DIAGNOSIS — T380X5A Adverse effect of glucocorticoids and synthetic analogues, initial encounter: Secondary | ICD-10-CM | POA: Diagnosis present

## 2016-08-09 DIAGNOSIS — E1159 Type 2 diabetes mellitus with other circulatory complications: Secondary | ICD-10-CM | POA: Diagnosis present

## 2016-08-09 DIAGNOSIS — Z794 Long term (current) use of insulin: Secondary | ICD-10-CM

## 2016-08-09 DIAGNOSIS — E785 Hyperlipidemia, unspecified: Secondary | ICD-10-CM | POA: Diagnosis present

## 2016-08-09 DIAGNOSIS — J9601 Acute respiratory failure with hypoxia: Secondary | ICD-10-CM | POA: Diagnosis present

## 2016-08-09 DIAGNOSIS — E669 Obesity, unspecified: Secondary | ICD-10-CM | POA: Diagnosis present

## 2016-08-09 DIAGNOSIS — E1165 Type 2 diabetes mellitus with hyperglycemia: Secondary | ICD-10-CM

## 2016-08-09 DIAGNOSIS — I10 Essential (primary) hypertension: Secondary | ICD-10-CM | POA: Diagnosis present

## 2016-08-09 DIAGNOSIS — I509 Heart failure, unspecified: Secondary | ICD-10-CM

## 2016-08-09 DIAGNOSIS — F17210 Nicotine dependence, cigarettes, uncomplicated: Secondary | ICD-10-CM | POA: Diagnosis present

## 2016-08-09 DIAGNOSIS — I4891 Unspecified atrial fibrillation: Secondary | ICD-10-CM | POA: Diagnosis not present

## 2016-08-09 DIAGNOSIS — I700 Atherosclerosis of aorta: Secondary | ICD-10-CM | POA: Diagnosis present

## 2016-08-09 DIAGNOSIS — Z7901 Long term (current) use of anticoagulants: Secondary | ICD-10-CM

## 2016-08-09 DIAGNOSIS — E1151 Type 2 diabetes mellitus with diabetic peripheral angiopathy without gangrene: Secondary | ICD-10-CM | POA: Diagnosis present

## 2016-08-09 DIAGNOSIS — I272 Pulmonary hypertension, unspecified: Secondary | ICD-10-CM | POA: Diagnosis present

## 2016-08-09 DIAGNOSIS — J449 Chronic obstructive pulmonary disease, unspecified: Secondary | ICD-10-CM | POA: Diagnosis present

## 2016-08-09 DIAGNOSIS — I5023 Acute on chronic systolic (congestive) heart failure: Secondary | ICD-10-CM | POA: Diagnosis present

## 2016-08-09 DIAGNOSIS — Z881 Allergy status to other antibiotic agents status: Secondary | ICD-10-CM

## 2016-08-09 DIAGNOSIS — G4733 Obstructive sleep apnea (adult) (pediatric): Secondary | ICD-10-CM | POA: Diagnosis present

## 2016-08-09 DIAGNOSIS — Z9981 Dependence on supplemental oxygen: Secondary | ICD-10-CM

## 2016-08-09 DIAGNOSIS — K5900 Constipation, unspecified: Secondary | ICD-10-CM | POA: Diagnosis present

## 2016-08-09 LAB — I-STAT TROPONIN, ED
TROPONIN I, POC: 0.01 ng/mL (ref 0.00–0.08)
TROPONIN I, POC: 0.03 ng/mL (ref 0.00–0.08)

## 2016-08-09 LAB — CBC
HCT: 47.1 % — ABNORMAL HIGH (ref 36.0–46.0)
HEMOGLOBIN: 15.5 g/dL — AB (ref 12.0–15.0)
MCH: 29.4 pg (ref 26.0–34.0)
MCHC: 32.9 g/dL (ref 30.0–36.0)
MCV: 89.4 fL (ref 78.0–100.0)
Platelets: 316 10*3/uL (ref 150–400)
RBC: 5.27 MIL/uL — AB (ref 3.87–5.11)
RDW: 16.2 % — ABNORMAL HIGH (ref 11.5–15.5)
WBC: 15.7 10*3/uL — AB (ref 4.0–10.5)

## 2016-08-09 LAB — BASIC METABOLIC PANEL
ANION GAP: 14 (ref 5–15)
BUN: 17 mg/dL (ref 6–20)
CALCIUM: 8.8 mg/dL — AB (ref 8.9–10.3)
CO2: 23 mmol/L (ref 22–32)
CREATININE: 0.84 mg/dL (ref 0.44–1.00)
Chloride: 98 mmol/L — ABNORMAL LOW (ref 101–111)
GFR calc Af Amer: >60 mL/min (ref >60)
GLUCOSE: 282 mg/dL — AB (ref 65–99)
Potassium: 6.2 mmol/L — ABNORMAL HIGH (ref 3.5–5.1)
Sodium: 135 mmol/L (ref 135–145)

## 2016-08-09 LAB — BRAIN NATRIURETIC PEPTIDE: B NATRIURETIC PEPTIDE 5: 315.4 pg/mL — AB (ref 0.0–100.0)

## 2016-08-09 MED ORDER — SODIUM CHLORIDE 0.9 % IV SOLN
INTRAVENOUS | Status: DC
  Administered 2016-08-10: 1000 mL via INTRAVENOUS

## 2016-08-09 MED ORDER — ONDANSETRON HCL 4 MG/2ML IJ SOLN
4.0000 mg | Freq: Once | INTRAMUSCULAR | Status: AC
  Administered 2016-08-10: 4 mg via INTRAVENOUS
  Filled 2016-08-09: qty 2

## 2016-08-09 MED ORDER — ALBUTEROL SULFATE (2.5 MG/3ML) 0.083% IN NEBU
5.0000 mg | INHALATION_SOLUTION | Freq: Once | RESPIRATORY_TRACT | Status: DC
  Filled 2016-08-09: qty 6

## 2016-08-09 MED ORDER — METHYLPREDNISOLONE SODIUM SUCC 125 MG IJ SOLR
125.0000 mg | Freq: Once | INTRAMUSCULAR | Status: AC
  Administered 2016-08-09: 125 mg via INTRAVENOUS
  Filled 2016-08-09: qty 2

## 2016-08-09 MED ORDER — IPRATROPIUM-ALBUTEROL 0.5-2.5 (3) MG/3ML IN SOLN
3.0000 mL | Freq: Once | RESPIRATORY_TRACT | Status: AC
  Administered 2016-08-09: 3 mL via RESPIRATORY_TRACT
  Filled 2016-08-09: qty 3

## 2016-08-09 MED ORDER — FUROSEMIDE 10 MG/ML IJ SOLN
80.0000 mg | Freq: Once | INTRAMUSCULAR | Status: AC
  Administered 2016-08-09: 80 mg via INTRAVENOUS
  Filled 2016-08-09: qty 8

## 2016-08-09 MED ORDER — SODIUM CHLORIDE 0.9 % IV SOLN: INTRAVENOUS | Status: DC

## 2016-08-09 MED ORDER — SODIUM CHLORIDE 0.9 % IV BOLUS (SEPSIS)
250.0000 mL | Freq: Once | INTRAVENOUS | Status: DC
  Administered 2016-08-09: 250 mL via INTRAVENOUS

## 2016-08-09 MED ORDER — HYDROMORPHONE HCL 2 MG/ML IJ SOLN
0.5000 mg | Freq: Once | INTRAMUSCULAR | Status: AC
  Administered 2016-08-10: 0.5 mg via INTRAVENOUS
  Filled 2016-08-09: qty 1

## 2016-08-09 NOTE — ED Triage Notes (Addendum)
Per [facility]: Pt to ED from home c/o SOB x 4 hours, worse with exertion, generalized weakness, and R shoulder and neck pain x 1 week. Pt hx CHF, DM, COPD - has O2 tanks at home but only uses prn - did not use prior to coming in. Upon Fire arrival, pt 97% RA, placed on 2L O2 [place] for comfort. Pt denies fevers/cough. EMS VS: HR 110 A-Fib on monitor, 178/104 manual, CBG 353. Pt diaphoretic and c/o "feeling hot" upon arrival. Resp currently equal but labored on 4L O2. Pt A&O x 4.

## 2016-08-09 NOTE — ED Notes (Signed)
Pt c/o sob today  Hx  Asthma and copd  She still smiokes

## 2016-08-09 NOTE — ED Notes (Signed)
Pt c/o rt shoulder pain that she has had for 2 days.  Dr Raliegh Ip edp will see her again before he will order any medication for her  She is breathing better and has voided once since her lasix was given

## 2016-08-10 ENCOUNTER — Ambulatory Visit: Payer: Medicare Other | Admitting: Cardiology

## 2016-08-10 ENCOUNTER — Encounter (HOSPITAL_COMMUNITY): Payer: Self-pay | Admitting: Internal Medicine

## 2016-08-10 ENCOUNTER — Inpatient Hospital Stay (HOSPITAL_COMMUNITY): Payer: Medicare Other

## 2016-08-10 DIAGNOSIS — E785 Hyperlipidemia, unspecified: Secondary | ICD-10-CM | POA: Diagnosis present

## 2016-08-10 DIAGNOSIS — I509 Heart failure, unspecified: Secondary | ICD-10-CM | POA: Diagnosis not present

## 2016-08-10 DIAGNOSIS — I110 Hypertensive heart disease with heart failure: Secondary | ICD-10-CM | POA: Diagnosis present

## 2016-08-10 DIAGNOSIS — I4891 Unspecified atrial fibrillation: Secondary | ICD-10-CM | POA: Diagnosis not present

## 2016-08-10 DIAGNOSIS — I482 Chronic atrial fibrillation: Secondary | ICD-10-CM | POA: Diagnosis present

## 2016-08-10 DIAGNOSIS — Z7901 Long term (current) use of anticoagulants: Secondary | ICD-10-CM | POA: Diagnosis not present

## 2016-08-10 DIAGNOSIS — Z882 Allergy status to sulfonamides status: Secondary | ICD-10-CM | POA: Diagnosis not present

## 2016-08-10 DIAGNOSIS — E669 Obesity, unspecified: Secondary | ICD-10-CM | POA: Diagnosis present

## 2016-08-10 DIAGNOSIS — E1165 Type 2 diabetes mellitus with hyperglycemia: Secondary | ICD-10-CM

## 2016-08-10 DIAGNOSIS — Z881 Allergy status to other antibiotic agents status: Secondary | ICD-10-CM | POA: Diagnosis not present

## 2016-08-10 DIAGNOSIS — E1151 Type 2 diabetes mellitus with diabetic peripheral angiopathy without gangrene: Secondary | ICD-10-CM | POA: Diagnosis present

## 2016-08-10 DIAGNOSIS — I480 Paroxysmal atrial fibrillation: Secondary | ICD-10-CM | POA: Diagnosis present

## 2016-08-10 DIAGNOSIS — G4733 Obstructive sleep apnea (adult) (pediatric): Secondary | ICD-10-CM | POA: Diagnosis present

## 2016-08-10 DIAGNOSIS — J441 Chronic obstructive pulmonary disease with (acute) exacerbation: Secondary | ICD-10-CM

## 2016-08-10 DIAGNOSIS — J42 Unspecified chronic bronchitis: Secondary | ICD-10-CM

## 2016-08-10 DIAGNOSIS — J9601 Acute respiratory failure with hypoxia: Secondary | ICD-10-CM

## 2016-08-10 DIAGNOSIS — Z794 Long term (current) use of insulin: Secondary | ICD-10-CM | POA: Diagnosis not present

## 2016-08-10 DIAGNOSIS — I5023 Acute on chronic systolic (congestive) heart failure: Secondary | ICD-10-CM

## 2016-08-10 DIAGNOSIS — T380X5A Adverse effect of glucocorticoids and synthetic analogues, initial encounter: Secondary | ICD-10-CM | POA: Diagnosis present

## 2016-08-10 DIAGNOSIS — Z6838 Body mass index (BMI) 38.0-38.9, adult: Secondary | ICD-10-CM | POA: Diagnosis not present

## 2016-08-10 DIAGNOSIS — Z9981 Dependence on supplemental oxygen: Secondary | ICD-10-CM | POA: Diagnosis not present

## 2016-08-10 LAB — COMPREHENSIVE METABOLIC PANEL
ALK PHOS: 44 U/L (ref 38–126)
ALT: 27 U/L (ref 14–54)
ANION GAP: 17 — AB (ref 5–15)
AST: 34 U/L (ref 15–41)
Albumin: 3.8 g/dL (ref 3.5–5.0)
BILIRUBIN TOTAL: 0.5 mg/dL (ref 0.3–1.2)
BUN: 17 mg/dL (ref 6–20)
CALCIUM: 8.7 mg/dL — AB (ref 8.9–10.3)
CO2: 22 mmol/L (ref 22–32)
Chloride: 97 mmol/L — ABNORMAL LOW (ref 101–111)
Creatinine, Ser: 0.91 mg/dL (ref 0.44–1.00)
GFR calc non Af Amer: >60 mL/min (ref >60)
Glucose, Bld: 333 mg/dL — ABNORMAL HIGH (ref 65–99)
Potassium: 3.7 mmol/L (ref 3.5–5.1)
SODIUM: 136 mmol/L (ref 135–145)
TOTAL PROTEIN: 7.5 g/dL (ref 6.5–8.1)

## 2016-08-10 LAB — GLUCOSE, CAPILLARY
GLUCOSE-CAPILLARY: 232 mg/dL — AB (ref 65–99)
GLUCOSE-CAPILLARY: 95 mg/dL (ref 65–99)
Glucose-Capillary: 330 mg/dL — ABNORMAL HIGH (ref 65–99)
Glucose-Capillary: 327 mg/dL — ABNORMAL HIGH (ref 65–99)
Glucose-Capillary: 153 mg/dL — ABNORMAL HIGH (ref 65–99)

## 2016-08-10 LAB — BASIC METABOLIC PANEL
Anion gap: 15 (ref 5–15)
BUN: 15 mg/dL (ref 6–20)
CALCIUM: 8.6 mg/dL — AB (ref 8.9–10.3)
CO2: 27 mmol/L (ref 22–32)
CREATININE: 0.95 mg/dL (ref 0.44–1.00)
Chloride: 96 mmol/L — ABNORMAL LOW (ref 101–111)
GFR calc non Af Amer: 58 mL/min — ABNORMAL LOW (ref >60)
Glucose, Bld: 355 mg/dL — ABNORMAL HIGH (ref 65–99)
Potassium: 3.8 mmol/L (ref 3.5–5.1)
SODIUM: 138 mmol/L (ref 135–145)

## 2016-08-10 LAB — I-STAT ARTERIAL BLOOD GAS, ED
ACID-BASE DEFICIT: 2 mmol/L (ref 0.0–2.0)
BICARBONATE: 24.4 mmol/L (ref 20.0–28.0)
O2 SAT: 92 %
PH ART: 7.322 — AB (ref 7.350–7.450)
PO2 ART: 70 mmHg — AB (ref 83.0–108.0)
Patient temperature: 98.6
TCO2: 26 mmol/L (ref 0–100)
pCO2 arterial: 47.2 mmHg (ref 32.0–48.0)

## 2016-08-10 LAB — CBC
HCT: 44.6 % (ref 36.0–46.0)
Hemoglobin: 14.4 g/dL (ref 12.0–15.0)
MCH: 29 pg (ref 26.0–34.0)
MCHC: 32.3 g/dL (ref 30.0–36.0)
MCV: 89.9 fL (ref 78.0–100.0)
PLATELETS: 271 10*3/uL (ref 150–400)
RBC: 4.96 MIL/uL (ref 3.87–5.11)
RDW: 16.1 % — AB (ref 11.5–15.5)
WBC: 11.9 10*3/uL — AB (ref 4.0–10.5)

## 2016-08-10 LAB — TSH: TSH: 1.725 u[IU]/mL (ref 0.350–4.500)

## 2016-08-10 LAB — MRSA PCR SCREENING: MRSA BY PCR: NEGATIVE

## 2016-08-10 LAB — MAGNESIUM: Magnesium: 1.8 mg/dL (ref 1.7–2.4)

## 2016-08-10 LAB — ECHOCARDIOGRAM COMPLETE: WEIGHTICAEL: 4127.01 [oz_av]

## 2016-08-10 MED ORDER — INSULIN ASPART 100 UNIT/ML ~~LOC~~ SOLN
5.0000 [IU] | Freq: Once | SUBCUTANEOUS | Status: AC
  Administered 2016-08-10: 5 [IU] via SUBCUTANEOUS

## 2016-08-10 MED ORDER — TIZANIDINE HCL 4 MG PO TABS
4.0000 mg | ORAL_TABLET | Freq: Three times a day (TID) | ORAL | Status: DC | PRN
  Filled 2016-08-10: qty 1

## 2016-08-10 MED ORDER — OXYCODONE HCL 5 MG PO TABS
5.0000 mg | ORAL_TABLET | ORAL | Status: DC | PRN
  Administered 2016-08-10: 5 mg via ORAL
  Filled 2016-08-10: qty 1

## 2016-08-10 MED ORDER — FUROSEMIDE 10 MG/ML IJ SOLN
60.0000 mg | Freq: Two times a day (BID) | INTRAMUSCULAR | Status: DC
Start: 2016-08-10 — End: 2016-08-10
  Administered 2016-08-10: 60 mg via INTRAVENOUS
  Filled 2016-08-10 (×2): qty 6

## 2016-08-10 MED ORDER — IPRATROPIUM-ALBUTEROL 0.5-2.5 (3) MG/3ML IN SOLN: 3.0000 mL | RESPIRATORY_TRACT | Status: DC | PRN

## 2016-08-10 MED ORDER — SODIUM CHLORIDE 0.9% FLUSH: 3.0000 mL | INTRAVENOUS | Status: DC | PRN

## 2016-08-10 MED ORDER — FUROSEMIDE 10 MG/ML IJ SOLN
80.0000 mg | Freq: Two times a day (BID) | INTRAMUSCULAR | Status: DC
  Administered 2016-08-10 – 2016-08-13 (×6): 80 mg via INTRAVENOUS
  Filled 2016-08-10 (×5): qty 8

## 2016-08-10 MED ORDER — MAGNESIUM SULFATE 2 GM/50ML IV SOLN: 2.0000 g | Freq: Once | INTRAVENOUS | Status: DC

## 2016-08-10 MED ORDER — SODIUM CHLORIDE 0.9 % IV SOLN
250.0000 mL | INTRAVENOUS | Status: DC
  Administered 2016-08-11: 12:00:00 via INTRAVENOUS

## 2016-08-10 MED ORDER — ACETAMINOPHEN 325 MG PO TABS: 650.0000 mg | ORAL_TABLET | Freq: Four times a day (QID) | ORAL | Status: DC | PRN

## 2016-08-10 MED ORDER — SPIRONOLACTONE 25 MG PO TABS
12.5000 mg | ORAL_TABLET | Freq: Every day | ORAL | Status: DC
  Administered 2016-08-10 – 2016-08-13 (×3): 12.5 mg via ORAL
  Filled 2016-08-10 (×3): qty 1

## 2016-08-10 MED ORDER — POTASSIUM CHLORIDE ER 10 MEQ PO TBCR
10.0000 meq | EXTENDED_RELEASE_TABLET | Freq: Every day | ORAL | Status: DC
  Administered 2016-08-10 – 2016-08-13 (×4): 10 meq via ORAL
  Filled 2016-08-10 (×7): qty 1

## 2016-08-10 MED ORDER — APIXABAN 5 MG PO TABS
5.0000 mg | ORAL_TABLET | Freq: Two times a day (BID) | ORAL | Status: DC
Start: 2016-08-10 — End: 2016-08-13
  Administered 2016-08-10 – 2016-08-13 (×7): 5 mg via ORAL
  Filled 2016-08-10 (×7): qty 1

## 2016-08-10 MED ORDER — MAGNESIUM SULFATE 2 GM/50ML IV SOLN
2.0000 g | Freq: Once | INTRAVENOUS | Status: AC
  Administered 2016-08-10: 2 g via INTRAVENOUS
  Filled 2016-08-10: qty 50

## 2016-08-10 MED ORDER — ACETAMINOPHEN 650 MG RE SUPP: 650.0000 mg | Freq: Four times a day (QID) | RECTAL | Status: DC | PRN

## 2016-08-10 MED ORDER — LOSARTAN POTASSIUM 50 MG PO TABS
50.0000 mg | ORAL_TABLET | Freq: Every day | ORAL | Status: DC
  Administered 2016-08-11 – 2016-08-13 (×3): 50 mg via ORAL
  Filled 2016-08-10 (×3): qty 1

## 2016-08-10 MED ORDER — LISINOPRIL 10 MG PO TABS
20.0000 mg | ORAL_TABLET | Freq: Every day | ORAL | Status: DC
  Administered 2016-08-10: 20 mg via ORAL
  Filled 2016-08-10: qty 1

## 2016-08-10 MED ORDER — METOPROLOL TARTRATE 12.5 MG HALF TABLET
12.5000 mg | ORAL_TABLET | Freq: Two times a day (BID) | ORAL | Status: DC
  Administered 2016-08-10: 12.5 mg via ORAL
  Filled 2016-08-10: qty 1

## 2016-08-10 MED ORDER — FENOFIBRATE 160 MG PO TABS
160.0000 mg | ORAL_TABLET | Freq: Every day | ORAL | Status: DC
  Administered 2016-08-10 – 2016-08-13 (×4): 160 mg via ORAL
  Filled 2016-08-10 (×4): qty 1

## 2016-08-10 MED ORDER — LEVALBUTEROL HCL 0.63 MG/3ML IN NEBU
0.6300 mg | INHALATION_SOLUTION | Freq: Four times a day (QID) | RESPIRATORY_TRACT | Status: DC | PRN
  Administered 2016-08-11: 0.63 mg via RESPIRATORY_TRACT
  Filled 2016-08-10: qty 3

## 2016-08-10 MED ORDER — GLIMEPIRIDE 4 MG PO TABS
4.0000 mg | ORAL_TABLET | Freq: Every day | ORAL | Status: DC
Start: 2016-08-10 — End: 2016-08-13
  Administered 2016-08-10 – 2016-08-13 (×3): 4 mg via ORAL
  Filled 2016-08-10: qty 1
  Filled 2016-08-10: qty 2
  Filled 2016-08-10 (×2): qty 1

## 2016-08-10 MED ORDER — BUDESONIDE 0.25 MG/2ML IN SUSP
0.2500 mg | Freq: Two times a day (BID) | RESPIRATORY_TRACT | Status: DC
  Administered 2016-08-10 – 2016-08-13 (×7): 0.25 mg via RESPIRATORY_TRACT
  Filled 2016-08-10 (×7): qty 2

## 2016-08-10 MED ORDER — SODIUM CHLORIDE 0.9% FLUSH
3.0000 mL | Freq: Two times a day (BID) | INTRAVENOUS | Status: DC
  Administered 2016-08-11 – 2016-08-13 (×4): 3 mL via INTRAVENOUS

## 2016-08-10 MED ORDER — METOPROLOL SUCCINATE ER 25 MG PO TB24
25.0000 mg | ORAL_TABLET | Freq: Two times a day (BID) | ORAL | Status: DC
  Administered 2016-08-10 – 2016-08-13 (×6): 25 mg via ORAL
  Filled 2016-08-10 (×6): qty 1

## 2016-08-10 MED ORDER — AMIODARONE HCL IN DEXTROSE 360-4.14 MG/200ML-% IV SOLN
30.0000 mg/h | INTRAVENOUS | Status: DC
  Administered 2016-08-10 – 2016-08-12 (×3): 30 mg/h via INTRAVENOUS
  Filled 2016-08-10 (×2): qty 200

## 2016-08-10 MED ORDER — ONDANSETRON HCL 4 MG PO TABS: 4.0000 mg | ORAL_TABLET | Freq: Four times a day (QID) | ORAL | Status: DC | PRN

## 2016-08-10 MED ORDER — AMIODARONE HCL IN DEXTROSE 360-4.14 MG/200ML-% IV SOLN
60.0000 mg/h | INTRAVENOUS | Status: AC
  Administered 2016-08-10: 60 mg/h via INTRAVENOUS
  Filled 2016-08-10: qty 200

## 2016-08-10 MED ORDER — LEVALBUTEROL HCL 0.63 MG/3ML IN NEBU
0.6300 mg | INHALATION_SOLUTION | Freq: Three times a day (TID) | RESPIRATORY_TRACT | Status: DC
  Administered 2016-08-10 – 2016-08-13 (×6): 0.63 mg via RESPIRATORY_TRACT
  Filled 2016-08-10 (×7): qty 3

## 2016-08-10 MED ORDER — INSULIN ASPART 100 UNIT/ML ~~LOC~~ SOLN: 0.0000 [IU] | Freq: Three times a day (TID) | SUBCUTANEOUS | Status: DC

## 2016-08-10 MED ORDER — INSULIN GLARGINE 100 UNIT/ML ~~LOC~~ SOLN
15.0000 [IU] | Freq: Every day | SUBCUTANEOUS | Status: DC
  Administered 2016-08-11 – 2016-08-12 (×2): 15 [IU] via SUBCUTANEOUS
  Filled 2016-08-10 (×4): qty 0.15

## 2016-08-10 MED ORDER — INSULIN ASPART 100 UNIT/ML ~~LOC~~ SOLN
0.0000 [IU] | Freq: Three times a day (TID) | SUBCUTANEOUS | Status: DC
  Administered 2016-08-10: 7 [IU] via SUBCUTANEOUS
  Administered 2016-08-10: 4 [IU] via SUBCUTANEOUS
  Administered 2016-08-10: 15 [IU] via SUBCUTANEOUS
  Administered 2016-08-11 – 2016-08-12 (×3): 4 [IU] via SUBCUTANEOUS
  Administered 2016-08-12: 3 [IU] via SUBCUTANEOUS
  Administered 2016-08-12: 7 [IU] via SUBCUTANEOUS
  Administered 2016-08-13 (×2): 4 [IU] via SUBCUTANEOUS

## 2016-08-10 MED ORDER — ROSUVASTATIN CALCIUM 10 MG PO TABS
10.0000 mg | ORAL_TABLET | Freq: Every day | ORAL | Status: DC
  Administered 2016-08-10 – 2016-08-13 (×4): 10 mg via ORAL
  Filled 2016-08-10 (×4): qty 1

## 2016-08-10 MED ORDER — LEVALBUTEROL HCL 0.63 MG/3ML IN NEBU
0.6300 mg | INHALATION_SOLUTION | Freq: Four times a day (QID) | RESPIRATORY_TRACT | Status: DC
  Administered 2016-08-10 (×3): 0.63 mg via RESPIRATORY_TRACT
  Filled 2016-08-10 (×4): qty 3

## 2016-08-10 MED ORDER — ONDANSETRON HCL 4 MG/2ML IJ SOLN: 4.0000 mg | Freq: Four times a day (QID) | INTRAMUSCULAR | Status: DC | PRN

## 2016-08-10 MED ORDER — INSULIN ASPART 100 UNIT/ML ~~LOC~~ SOLN
5.0000 [IU] | Freq: Three times a day (TID) | SUBCUTANEOUS | Status: DC
  Administered 2016-08-10 (×3): 5 [IU] via SUBCUTANEOUS

## 2016-08-10 MED ORDER — METOPROLOL TARTRATE 25 MG PO TABS: 25.0000 mg | ORAL_TABLET | Freq: Two times a day (BID) | ORAL | Status: DC

## 2016-08-10 NOTE — Progress Notes (Signed)
PROGRESS NOTE                                                                                                                                                                                                             Patient Demographics:    Heidi Stark, is a 76 y.o. female, DOB - [birth year], [id number]  Admit date - 08/09/2016   Admitting Physician Rise Patience, MD  Outpatient Primary MD for the patient is Ann Held, DO  LOS - 0  Outpatient Specialists: Dr [name] ( cardiology)  Chief Complaint  Patient presents with  . Shortness of Breath       Brief Narrative   76 year old female with history of COPD (on home O2 at night), paroxysmal A. fib on antibiotics, diabetes mellitus and hypertension presented with shortness of breath for past few days worsened on exertion and having difficulty lying flat. She had again almost 7 pounds from her baseline and was found to be in acute CHF with rapid A. fib. Admitted to stepdown unit requiring BiPAP on admission.   Subjective:   Informs her breathing to be much better.  Assessment  & Plan :    Principal Problem:   Acute respiratory failure with hypoxia ([place]) Secondary to acute CHF exacerbation. On IV Lasix 60 mg twice a day. Monitor strict I/O and daily weight. TSH normal. Follow 2-D echo. Maintaining O2 sat on 3 L via nasal cannula. Heart failure team following. Added low dose Aldactone. Continue ACE inhibitor. Stable for transfer to telemetry.  Active Problems:    Atrial fibrillation with rapid ventricular response ([place]) Contributed by her acute CHF. Currently rate controlled. Continue beta blocker and anticoagulation.     COPD (chronic obstructive pulmonary disease) ([place])  with ?mild acute exacerbation. Continue nebulizer. Hold off on steroids due to hypoglycemia.    Type 2 diabetes mellitus with vascular disease (HCC) CBG elevated possibly  after receiving steroid in the ED. A1c of 7.7. Monitor on resistance sliding scale coverage and bedtime Lantus.     Obstructive sleep apnea Continue nighttime CPAP.    Essential hypertension Stable. Continue home medications        Code Status : Full code  Family Communication  : None at bedside  Disposition Plan  : Home once improved   Barriers For Discharge : Active symptoms  Consults  :  Heart failure  Procedures  : 2-D echo   DVT Prophylaxis  : eliquis  Lab Results  Component Value Date   PLT 271 08/10/2016    Antibiotics  :   Anti-infectives    None        Objective:   Vitals:   08/10/16 0900 08/10/16 1007 08/10/16 1125 08/10/16 1200  BP: (!) 169/73 (!) 138/104  (!) 154/88  Pulse: 94 93  100  Resp: 18 (!) 24  18  Temp:   97.8 F (36.6 C)   TempSrc:   Oral   SpO2: 97% 95%  94%  Weight:        Wt Readings from Last 3 Encounters:  08/10/16 117 kg (257 lb 15 oz)  06/26/16 113 kg (249 lb 1.9 oz)  05/27/16 112.9 kg (249 lb)     Intake/Output Summary (Last 24 hours) at 08/10/16 1252 Last data filed at 08/10/16 0935  Gross per 24 hour  Intake              400 ml  Output             2200 ml  Net            -1800 ml     Physical Exam  Gen: not in distress HEENT: moist mucosa, supple neck Chest:Scattered rhonchi over right  CVS:  S1 and S2 irregular, no murmurs rub or gallop  GI: soft, NT, ND, BS+ Musculoskeletal: warm,trace edema bilaterally      Data Review:    CBC  Recent Labs Lab 08/09/16 2149 08/10/16 0419  WBC 15.7* 11.9*  HGB 15.5* 14.4  HCT 47.1* 44.6  PLT 316 271  MCV 89.4 89.9  MCH 29.4 29.0  MCHC 32.9 32.3  RDW 16.2* 16.1*    Chemistries   Recent Labs Lab 08/09/16 2149 08/10/16 0026 08/10/16 0419  NA 135 136 138  K 6.2* 3.7 3.8  CL 98* 97* 96*  CO2 23 22 27   GLUCOSE 282* 333* 355*  BUN 17 17 15   CREATININE 0.84 0.91 0.95  CALCIUM 8.8* 8.7* 8.6*  MG  --   --  1.8  AST  --  34  --   ALT  --  27  --     ALKPHOS  --  44  --   BILITOT  --  0.5  --    ------------------------------------------------------------------------------------------------------------------ No results for input(s): CHOL, HDL, LDLCALC, TRIG, CHOLHDL, LDLDIRECT in the last 72 hours.  Lab Results  Component Value Date   HGBA1C 7.7 (H) 10/11/2015   ------------------------------------------------------------------------------------------------------------------  Recent Labs  08/10/16 0419  TSH 1.725   ------------------------------------------------------------------------------------------------------------------ No results for input(s): VITAMINB12, FOLATE, FERRITIN, TIBC, IRON, RETICCTPCT in the last 72 hours.  Coagulation profile No results for input(s): INR, PROTIME in the last 168 hours.  No results for input(s): DDIMER in the last 72 hours.  Cardiac Enzymes No results for input(s): CKMB, TROPONINI, MYOGLOBIN in the last 168 hours.  Invalid input(s): CK ------------------------------------------------------------------------------------------------------------------    Component Value Date/Time   BNP 315.4 (H) 08/09/2016 2149    Inpatient Medications  Scheduled Meds: . apixaban  5 mg Oral BID  . budesonide (PULMICORT) nebulizer solution  0.25 mg Nebulization BID  . fenofibrate  160 mg Oral Daily  . furosemide  60 mg Intravenous Q12H  . glimepiride  4 mg Oral Q breakfast  . insulin aspart  0-20 Units Subcutaneous TID WC  . insulin aspart  5 Units Subcutaneous TID  WC  . insulin glargine  15 Units Subcutaneous QHS  . levalbuterol  0.63 mg Nebulization Q6H  . lisinopril  20 mg Oral Daily  . metoprolol succinate  25 mg Oral BID  . potassium chloride  10 mEq Oral Daily  . rosuvastatin  10 mg Oral Daily  . spironolactone  12.5 mg Oral Daily   Continuous Infusions: PRN Meds:.acetaminophen **OR** acetaminophen, levalbuterol, ondansetron **OR** ondansetron (ZOFRAN) IV, oxyCODONE,  tiZANidine  Micro Results Recent Results (from the past 240 hour(s))  MRSA PCR Screening     Status: None   Collection Time: 08/10/16  3:37 AM  Result Value Ref Range Status   MRSA by PCR NEGATIVE NEGATIVE Final    Comment:        The GeneXpert MRSA Assay (FDA approved for NASAL specimens only), is one component of a comprehensive MRSA colonization surveillance program. It is not intended to diagnose MRSA infection nor to guide or monitor treatment for MRSA infections.     Radiology Reports Dg Chest Port 1 View  Result Date: 08/09/2016 CLINICAL DATA:  76 year old female with shortness of breath. History of asthma and COPD. EXAM: PORTABLE CHEST 1 VIEW COMPARISON:  Chest radiograph dated 10/30/2013 FINDINGS: There is stable cardiomegaly. Increased central vascular and interstitial prominence compatible with congestive changes. There is no focal consolidation, pleural effusion, or pneumothorax. No acute osseous pathology. IMPRESSION: Cardiomegaly with mild CHF. Pneumonia is not excluded. Clinical correlation is recommended. Electronically Signed   By: Anner Crete M.D.   On: 08/09/2016 22:28    Time Spent in minutes  35   Louellen Molder M.D on 08/10/2016 at 12:52 PM  Between 7am to 7pm - Pager - [phone]  After 7pm go to [email or web address] - password [facility]  [facility] -  Office  [phone]

## 2016-08-10 NOTE — Progress Notes (Signed)
  Echocardiogram 2D Echocardiogram has been performed.  Heidi Stark M 08/10/2016, 10:51 AM

## 2016-08-10 NOTE — H&P (Signed)
History and Physical    Heidi Stark [id number] DOB: Apr 21, 1941 DOA: 08/09/2016  PCP: Ann Held, DO  Patient coming from: Home.  Chief Complaint: Shortness of breath.  HPI: Heidi Stark is a 76 y.o. female with history of COPD, diabetes mellitus, paroxysmal atrial fibrillation, hypertension presents to the ER because of shortness of breath. Patient states over the last 2-3 days patient has been having increasing shortness of breath with exertion. Patient also has been having difficulty lying flat. Patient states she has gained weight from baseline and in the ER patient was weighing around 257 pounds which is more than 7 pounds from her baseline. Patient was found to be initially wheezing for which patient was placed on nebulizer treatment and also was given Lasix 80 mg IV in the ER and placed on BiPAP. On exam patient has elevated JVD and lower extremity edema. Patient is being admitted for acute respiratory failure probably from CHF. Initially patient was in A. fib with RVR which improved without any intervention.   ED Course: Patient was given nebulizer treatment steroids Lasix 80 mg IV and placed on BiPAP.  Review of Systems: As per HPI, rest all negative.   Past Medical History:  Diagnosis Date  . COPD (chronic obstructive pulmonary disease) ([place])   . Diabetes mellitus    Type II  . DVT (deep venous thrombosis) ([place])   . Hyperlipidemia   . Hypertension   . PAF (paroxysmal atrial fibrillation) ([place])     Past Surgical History:  Procedure Laterality Date  . BACK SURGERY     vein graft and colon repair after nicked artery with back surgert  . COLONOSCOPY    . CYSTECTOMY     between bladder and kidneys  . GANGLION CYST EXCISION    . KNEE SURGERY     Right Knee cart.Removal  . TEE WITHOUT CARDIOVERSION N/A 10/31/2013   Procedure: TRANSESOPHAGEAL ECHOCARDIOGRAM (TEE);  Surgeon: Larey Dresser, MD;  Location: [facility];  Service: Cardiovascular;  Laterality:  N/A;  . TUBAL LIGATION       reports that she has been smoking Cigarettes.  She has been smoking about 0.50 packs per day. She has never used smokeless tobacco. She reports that she does not drink alcohol or use drugs.  Allergies  Allergen Reactions  . Neomycin-Bacitracin Zn-Polymyx Rash  . Sulfonamide Derivatives Other (See Comments)    Stomach cramps  . Ciprofloxacin Hives, Itching and Rash    Family History  Problem Relation Age of Onset  . Diabetes    . Melanoma    . Factor V Leiden deficiency Daughter     Prior to Admission medications   Medication Sig Start Date End Date Taking? Authorizing Provider  ADVAIR DISKUS 250-50 MCG/DOSE AEPB INHALE 1 PUFF INTO THE LUNGS EVERY 12 (TWELVE) HOURS. AS NEEDED FOR SHORTNESS OF BREATH 10/25/15  Yes Yvonne R Lowne Chase, DO  albuterol (PROVENTIL HFA;VENTOLIN HFA) 108 (90 Base) MCG/ACT inhaler Inhale 2 puffs into the lungs every 6 (six) hours as needed for wheezing or shortness of breath. 10/11/15  Yes [name] Lowne Chase, DO  apixaban (ELIQUIS) 5 MG TABS tablet Take 1 tablet (5 mg total) by mouth 2 (two) times daily. 10/28/15  Yes Larey Dresser, MD  fenofibrate 160 MG tablet TAKE 1 TABLET (160 MG TOTAL) BY MOUTH DAILY. 10/11/15  Yes Yvonne R Lowne Chase, DO  furosemide (LASIX) 20 MG tablet TAKE 1 TABLET BY MOUTH TWICE A DAY 06/12/16  Yes  [name] Lowne Chase, DO  glimepiride (AMARYL) 2 MG tablet Take 1 tablet (2 mg total) by mouth daily with breakfast. Patient taking differently: Take 2-4 mg by mouth See admin instructions. Takes 2 tabs in am and 1 tab in pm 10/11/15  Yes Yvonne R Lowne Chase, DO  insulin glargine (LANTUS) 100 UNIT/ML injection Inject 0.15 mLs (15 Units total) into the skin at bedtime. 10 units qhs x 30 days 10/11/15  Yes Yvonne R Lowne Chase, DO  KLOR-CON 10 10 MEQ tablet TAKE 1 TABLET (10 MEQ TOTAL) BY MOUTH 2 (TWO) TIMES DAILY. 12/14/15  Yes Yvonne R Lowne Chase, DO  lisinopril (PRINIVIL,ZESTRIL) 20 MG tablet TAKE 1 TABLET BY  MOUTH DAILY. MUST SCHEDULE APPOINTMENT FOR REFILLS 07/12/16  Yes Larey Dresser, MD  metFORMIN (GLUCOPHAGE) 1000 MG tablet Take 1 tablet (1,000 mg total) by mouth 2 (two) times daily with a meal. 10/11/15  Yes [name] Lowne Chase, DO  metoprolol tartrate (LOPRESSOR) 25 MG tablet TAKE 0.5 TABLETS (12.5 MG TOTAL) BY MOUTH 2 (TWO) TIMES DAILY. 10/11/15  Yes Yvonne R Lowne Chase, DO  rosuvastatin (CRESTOR) 10 MG tablet Take 1 tablet (10 mg total) by mouth daily. 10/11/15  Yes Yvonne R Lowne Chase, DO  tiZANidine (ZANAFLEX) 4 MG tablet Take 4 mg by mouth 3 (three) times daily as needed for muscle spasms. 08/07/16  Yes Historical Provider, MD    Physical Exam: Vitals:   08/10/16 0045 08/10/16 0119 08/10/16 0130 08/10/16 0321  BP: 165/98 (!) 143/103 116/90   Pulse: 111 102 106   Resp: 18 22 21    Temp:    97.8 F (36.6 C)  TempSrc:    Oral  SpO2: 92% 98% 98%       Constitutional: Moderately built and nourished. Vitals:   08/10/16 0045 08/10/16 0119 08/10/16 0130 08/10/16 0321  BP: 165/98 (!) 143/103 116/90   Pulse: 111 102 106   Resp: 18 22 21    Temp:    97.8 F (36.6 C)  TempSrc:    Oral  SpO2: 92% 98% 98%    Eyes: Anicteric no pallor. ENMT: No discharge from the ears eyes nose and mouth. Neck: JVD elevated no mass felt. Respiratory: No rhonchi or crepitations. Cardiovascular: S1-S2 heard no murmurs appreciated. Abdomen: Soft nontender bowel sounds present. Musculoskeletal: Bilateral lower extremity edema. Skin: No rash. Skin appears warm. Neurologic: Alert awake oriented to time place and person. Moves all extremities. Psychiatric: Appears normal. Normal affect.   Labs on Admission: I have personally reviewed following labs and imaging studies  CBC:  Recent Labs Lab 08/09/16 2149  WBC 15.7*  HGB 15.5*  HCT 47.1*  MCV 89.4  PLT 123XX123   Basic Metabolic Panel:  Recent Labs Lab 08/09/16 2149 08/10/16 0026  NA 135 136  K 6.2* 3.7  CL 98* 97*  CO2 23 22  GLUCOSE  282* 333*  BUN 17 17  CREATININE 0.84 0.91  CALCIUM 8.8* 8.7*   GFR: CrCl cannot be calculated (Unknown ideal weight.). Liver Function Tests:  Recent Labs Lab 08/10/16 0026  AST 34  ALT 27  ALKPHOS 44  BILITOT 0.5  PROT 7.5  ALBUMIN 3.8   No results for input(s): LIPASE, AMYLASE in the last 168 hours. No results for input(s): AMMONIA in the last 168 hours. Coagulation Profile: No results for input(s): INR, PROTIME in the last 168 hours. Cardiac Enzymes: No results for input(s): CKTOTAL, CKMB, CKMBINDEX, TROPONINI in the last 168 hours. BNP (last 3 results) No results  for input(s): PROBNP in the last 8760 hours. HbA1C: No results for input(s): HGBA1C in the last 72 hours. CBG:  Recent Labs Lab 08/10/16 0323  GLUCAP 330*   Lipid Profile: No results for input(s): CHOL, HDL, LDLCALC, TRIG, CHOLHDL, LDLDIRECT in the last 72 hours. Thyroid Function Tests: No results for input(s): TSH, T4TOTAL, FREET4, T3FREE, THYROIDAB in the last 72 hours. Anemia Panel: No results for input(s): VITAMINB12, FOLATE, FERRITIN, TIBC, IRON, RETICCTPCT in the last 72 hours. Urine analysis:    Component Value Date/Time   COLORURINE ORANGE (A) 11/23/2014 2250   APPEARANCEUR TURBID (A) 11/23/2014 2250   LABSPEC 1.030 11/23/2014 2250   PHURINE 5.5 11/23/2014 2250   GLUCOSEU 100 (A) 11/23/2014 2250   GLUCOSEU Negative 11/30/2011 0804   HGBUR SMALL (A) 11/23/2014 2250   BILIRUBINUR neg 05/18/2016 0932   KETONESUR NEGATIVE 11/23/2014 2250   PROTEINUR 0.15 05/18/2016 0932   PROTEINUR 100 (A) 11/23/2014 2250   UROBILINOGEN negative 05/18/2016 0932   UROBILINOGEN 1.0 11/23/2014 2250   NITRITE neg 05/18/2016 0932   NITRITE NEGATIVE 11/23/2014 2250   LEUKOCYTESUR small (1+) (A) 05/18/2016 0932   Sepsis Labs: @LABRCNTIP (procalcitonin:4,lacticidven:4) )No results found for this or any previous visit (from the past 240 hour(s)).   Radiological Exams on Admission: Dg Chest Port 1  View  Result Date: 08/09/2016 CLINICAL DATA:  76 year old female with shortness of breath. History of asthma and COPD. EXAM: PORTABLE CHEST 1 VIEW COMPARISON:  Chest radiograph dated 10/30/2013 FINDINGS: There is stable cardiomegaly. Increased central vascular and interstitial prominence compatible with congestive changes. There is no focal consolidation, pleural effusion, or pneumothorax. No acute osseous pathology. IMPRESSION: Cardiomegaly with mild CHF. Pneumonia is not excluded. Clinical correlation is recommended. Electronically Signed   By: Anner Crete M.D.   On: 08/09/2016 22:28    EKG: Independently reviewed. A. fib with RVR.  Assessment/Plan Principal Problem:   Acute respiratory failure with hypoxia (HCC) Active Problems:   Type 2 diabetes mellitus with vascular disease (HCC)   HLD (hyperlipidemia)   Obstructive sleep apnea   Essential hypertension   COPD (chronic obstructive pulmonary disease) (HCC)   Atrial fibrillation with rapid ventricular response (HCC)   Congestive heart failure ([place])    1. Acute respiratory failure with hypoxia most likely secondary to CHF - patient did receive Lasix 80 mg IV in the ER and I have placed patient on Lasix 60 mg IV every 12. Closely follow intake output metabolic panel daily weights. Patient is on ACE inhibitor. Check 2-D echo and cycle cardiac markers. 2. A. fib with RVR probably contributing to #1 - heart rate has improved and it is at present around 110 bpm. Patient is on metoprolol and Apixaban. Chads 2 vasc score is more than 2. Check TSH. 3. COPD - on presentation to the ER patient was found to be wheezing which has improved and by the time I examined there was no wheezing. Continue when necessary Xopenex Pulmicort. 4. Diabetes mellitus type 2 uncontrolled with hyperglycemia - probably secondary to steroid use in the ER. Will closely follow CBGs continue patient's home dose of Lantus and Amaryl. Will hold metformin while  inpatient. 5. Obstructive sleep apnea on BiPAP at this time. 6. Hypertension - continue lisinopril and metoprolol. 7. Hyperlipidemia on statins and fenofibrate.   DVT prophylaxis: Apixaban. Code Status: Full code.  Family Communication: Discussed with patient.  Disposition Plan: Home.  Consults called: None.  Admission status: Inpatient.    Rise Patience MD [facility] Pager [phone].  If 7PM-7AM, please contact night-coverage [email or web address] Password [facility]  08/10/2016, 3:46 AM

## 2016-08-10 NOTE — Progress Notes (Signed)
Pt. Transported uneventfully from ED to 2MW-02, RT to monitor.

## 2016-08-10 NOTE — ED Provider Notes (Signed)
[facility] DEPT Provider Note   CSN: [id number] Arrival date & time: 08/09/16  2131     History   Chief Complaint Chief Complaint  Patient presents with  . Shortness of Breath    HPI Heidi Stark is a 76 y.o. female.  Patient brought in by EMS. Patient with a history of COPD CHF diabetes chronic atrial fib on blood thinners. Patient third has breathing problems about a week ago that sniffily worse today. Patient has the ability to use oxygen at home as needed she uses C Pap at night. Patient currently not on steroids. Patient states she's been taking her meds. Including her blood thinner. Patient was not given any breathing treatments in route by EMS patient arrived in sniffing a respiratory distress.      Past Medical History:  Diagnosis Date  . COPD (chronic obstructive pulmonary disease) ([place])   . Diabetes mellitus    Type II  . DVT (deep venous thrombosis) ([place])   . Hyperlipidemia   . Hypertension   . PAF (paroxysmal atrial fibrillation) [facility])     Patient Active Problem List   Diagnosis Date Noted  . Atherosclerosis of aorta ([place]) 06/26/2016  . Tobacco abuse 06/26/2016  . Colitis 11/24/2014  . Severe obesity (BMI >= 40) ([place]) 11/12/2013  . PAF (paroxysmal atrial fibrillation) ([place]) 11/03/2013  . 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 COLONIC POLYPS 08/16/2010  . PAD (peripheral artery disease) ([place]) 08/15/2010  . HLD (hyperlipidemia) 11/18/2009  . MYALGIA 11/18/2009  . OBESITY 05/14/2007  . PULMONARY NODULE, RIGHT MIDDLE LOBE 05/14/2007  . Type II diabetes mellitus, well controlled ([place]) 03/20/2007  . OBSTRUCTIVE SLEEP APNEA 12/06/2006  . Essential hypertension 12/06/2006  . COPD (chronic obstructive pulmonary disease) ([place]) 12/06/2006    Past Surgical History:  Procedure Laterality Date  . BACK SURGERY     vein graft and colon repair after nicked artery with back surgert  . COLONOSCOPY    . CYSTECTOMY      between bladder and kidneys  . GANGLION CYST EXCISION    . KNEE SURGERY     Right Knee cart.Removal  . TEE WITHOUT CARDIOVERSION N/A 10/31/2013   Procedure: TRANSESOPHAGEAL ECHOCARDIOGRAM (TEE);  Surgeon: Larey Dresser, MD;  Location: [facility];  Service: Cardiovascular;  Laterality: N/A;  . TUBAL LIGATION      OB History    No data available       Home Medications    Prior to Admission medications   Medication Sig Start Date End Date Taking? Authorizing Provider  ADVAIR DISKUS 250-50 MCG/DOSE AEPB INHALE 1 PUFF INTO THE LUNGS EVERY 12 (TWELVE) HOURS. AS NEEDED FOR SHORTNESS OF BREATH 10/25/15  Yes Yvonne R Lowne Chase, DO  albuterol (PROVENTIL HFA;VENTOLIN HFA) 108 (90 Base) MCG/ACT inhaler Inhale 2 puffs into the lungs every 6 (six) hours as needed for wheezing or shortness of breath. 10/11/15  Yes [name] Lowne Chase, DO  apixaban (ELIQUIS) 5 MG TABS tablet Take 1 tablet (5 mg total) by mouth 2 (two) times daily. 10/28/15  Yes Larey Dresser, MD  fenofibrate 160 MG tablet TAKE 1 TABLET (160 MG TOTAL) BY MOUTH DAILY. 10/11/15  Yes Yvonne R Lowne Chase, DO  furosemide (LASIX) 20 MG tablet TAKE 1 TABLET BY MOUTH TWICE A DAY 06/12/16  Yes Yvonne R Lowne Chase, DO  glimepiride (AMARYL) 2 MG tablet Take 1 tablet (2 mg total) by mouth daily with breakfast. Patient taking differently: Take  2-4 mg by mouth See admin instructions. Takes 2 tabs in am and 1 tab in pm 10/11/15  Yes Yvonne R Lowne Chase, DO  insulin glargine (LANTUS) 100 UNIT/ML injection Inject 0.15 mLs (15 Units total) into the skin at bedtime. 10 units qhs x 30 days 10/11/15  Yes Yvonne R Lowne Chase, DO  KLOR-CON 10 10 MEQ tablet TAKE 1 TABLET (10 MEQ TOTAL) BY MOUTH 2 (TWO) TIMES DAILY. 12/14/15  Yes Yvonne R Lowne Chase, DO  lisinopril (PRINIVIL,ZESTRIL) 20 MG tablet TAKE 1 TABLET BY MOUTH DAILY. MUST SCHEDULE APPOINTMENT FOR REFILLS 07/12/16  Yes Larey Dresser, MD  metFORMIN (GLUCOPHAGE) 1000 MG tablet Take 1 tablet  (1,000 mg total) by mouth 2 (two) times daily with a meal. 10/11/15  Yes [name] Lowne Chase, DO  metoprolol tartrate (LOPRESSOR) 25 MG tablet TAKE 0.5 TABLETS (12.5 MG TOTAL) BY MOUTH 2 (TWO) TIMES DAILY. 10/11/15  Yes Yvonne R Lowne Chase, DO  rosuvastatin (CRESTOR) 10 MG tablet Take 1 tablet (10 mg total) by mouth daily. 10/11/15  Yes Yvonne R Lowne Chase, DO  tiZANidine (ZANAFLEX) 4 MG tablet Take 4 mg by mouth 3 (three) times daily as needed for muscle spasms. 08/07/16  Yes Historical Provider, MD    Family History Family History  Problem Relation Age of Onset  . Diabetes    . Melanoma    . Factor V Leiden deficiency Daughter     Social History Social History  Substance Use Topics  . Smoking status: Current Every Day Smoker    Packs/day: 0.50    Types: Cigarettes    Last attempt to quit: 09/14/2005  . Smokeless tobacco: Never Used  . Alcohol use No     Allergies   Neomycin-bacitracin zn-polymyx; Sulfonamide derivatives; and Ciprofloxacin   Review of Systems Review of Systems  Constitutional: Positive for fatigue.  HENT: Negative for congestion.   Eyes: Positive for redness.  Respiratory: Positive for shortness of breath and wheezing.   Cardiovascular: Positive for chest pain and leg swelling.  Gastrointestinal: Negative for abdominal pain.  Genitourinary: Negative for dysuria.  Musculoskeletal: Negative for back pain.  Skin: Negative for rash.  Neurological: Negative for headaches.  Hematological: Bruises/bleeds easily.  Psychiatric/Behavioral: Negative for confusion.     Physical Exam Updated Vital Signs BP 155/83   Pulse 95   Temp 99.8 F (37.7 C) (Oral)   Resp (!) 27   SpO2 93%   Physical Exam  Constitutional: She is oriented to person, place, and time. She appears well-developed and well-nourished. She appears distressed.  HENT:  Head: Normocephalic and atraumatic.  Mouth/Throat: Oropharynx is clear and moist.  Eyes: Conjunctivae and EOM are normal.  Pupils are equal, round, and reactive to light.  Neck: Normal range of motion. Neck supple.  Cardiovascular:  Tachycardic and irregular  Pulmonary/Chest: She is in respiratory distress. She has wheezes.  Abdominal: Soft. Bowel sounds are normal. There is no tenderness.  Musculoskeletal: Normal range of motion. She exhibits edema.  Neurological: She is alert and oriented to person, place, and time. No cranial nerve deficit or sensory deficit. She exhibits normal muscle tone. Coordination normal.  Skin: Skin is warm.  Nursing note and vitals reviewed.    ED Treatments / Results  Labs (all labs ordered are listed, but only abnormal results are displayed) Labs Reviewed  CBC - Abnormal; Notable for the following:       Result Value   WBC 15.7 (*)    RBC 5.27 (*)  Hemoglobin 15.5 (*)    HCT 47.1 (*)    RDW 16.2 (*)    All other components within normal limits  BRAIN NATRIURETIC PEPTIDE - Abnormal; Notable for the following:    B Natriuretic Peptide 315.4 (*)    All other components within normal limits  BASIC METABOLIC PANEL - Abnormal; Notable for the following:    Potassium 6.2 (*)    Chloride 98 (*)    Glucose, Bld 282 (*)    Calcium 8.8 (*)    All other components within normal limits  I-STAT TROPOININ, ED  I-STAT TROPOININ, ED  I-STAT ARTERIAL BLOOD GAS, ED   Results for orders placed or performed during the hospital encounter of 08/09/16  CBC  Result Value Ref Range   WBC 15.7 (H) 4.0 - 10.5 K/uL   RBC 5.27 (H) 3.87 - 5.11 MIL/uL   Hemoglobin 15.5 (H) 12.0 - 15.0 g/dL   HCT 47.1 (H) 36.0 - 46.0 %   MCV 89.4 78.0 - 100.0 fL   MCH 29.4 26.0 - 34.0 pg   MCHC 32.9 30.0 - 36.0 g/dL   RDW 16.2 (H) 11.5 - 15.5 %   Platelets 316 150 - 400 K/uL  Brain natriuretic peptide  Result Value Ref Range   B Natriuretic Peptide 315.4 (H) 0.0 - 100.0 pg/mL  Basic metabolic panel  Result Value Ref Range   Sodium 135 135 - 145 mmol/L   Potassium 6.2 (H) 3.5 - 5.1 mmol/L    Chloride 98 (L) 101 - 111 mmol/L   CO2 23 22 - 32 mmol/L   Glucose, Bld 282 (H) 65 - 99 mg/dL   BUN 17 6 - 20 mg/dL   Creatinine, Ser 0.84 0.44 - 1.00 mg/dL   Calcium 8.8 (L) 8.9 - 10.3 mg/dL   GFR calc non Af Amer >60 >60 mL/min   GFR calc Af Amer >60 >60 mL/min   Anion gap 14 5 - 15  I-stat troponin, ED  Result Value Ref Range   Troponin i, poc 0.01 0.00 - 0.08 ng/mL   Comment 3          I-stat troponin, ED  Result Value Ref Range   Troponin i, poc 0.03 0.00 - 0.08 ng/mL   Comment 3          I-Stat Arterial Blood Gas, ED - (order at [facility] and [facility] only)  Result Value Ref Range   pH, Arterial 7.322 (L) 7.350 - 7.450   pCO2 arterial 47.2 32.0 - 48.0 mmHg   pO2, Arterial 70.0 (L) 83.0 - 108.0 mmHg   Bicarbonate 24.4 20.0 - 28.0 mmol/L   TCO2 26 0 - 100 mmol/L   O2 Saturation 92.0 %   Acid-base deficit 2.0 0.0 - 2.0 mmol/L   Patient temperature 98.6 F    Collection site BRACHIAL ARTERY    Drawn by Operator    Sample type ARTERIAL      EKG  EKG Interpretation  Date/Time:  Wednesday August 09 2016 21:45:54 EST Ventricular Rate:  140 PR Interval:    QRS Duration: 114 QT Interval:  336 QTC Calculation: 512 R Axis:     Text Interpretation:  Atrial fibrillation with rapid ventricular response Abnormal ECG Confirmed by Rogene Houston  MD, Navon Kotowski [phone]) on 08/09/2016 10:18:27 PM       Radiology Dg Chest Port 1 View  Result Date: 08/09/2016 CLINICAL DATA:  76 year old female with shortness of breath. History of asthma and COPD. EXAM: PORTABLE CHEST 1 VIEW COMPARISON:  Chest radiograph dated 10/30/2013 FINDINGS: There is stable cardiomegaly. Increased central vascular and interstitial prominence compatible with congestive changes. There is no focal consolidation, pleural effusion, or pneumothorax. No acute osseous pathology. IMPRESSION: Cardiomegaly with mild CHF. Pneumonia is not excluded. Clinical correlation is recommended. Electronically Signed   By: Anner Crete M.D.   On:  08/09/2016 22:28    Procedures Procedures (including critical care time)  CRITICAL CARE Performed by: Fredia Sorrow Total critical care time: 45 minutes Critical care time was exclusive of separately billable procedures and treating other patients. Critical care was necessary to treat or prevent imminent or life-threatening deterioration. Critical care was time spent personally by me on the following activities: development of treatment plan with patient and/or surrogate as well as nursing, discussions with consultants, evaluation of patient's response to treatment, examination of patient, obtaining history from patient or surrogate, ordering and performing treatments and interventions, ordering and review of laboratory studies, ordering and review of radiographic studies, pulse oximetry and re-evaluation of patient's condition.   Medications Ordered in ED Medications  albuterol (PROVENTIL) (2.5 MG/3ML) 0.083% nebulizer solution 5 mg (5 mg Nebulization Not Given 08/09/16 2302)  0.9 %  sodium chloride infusion (1,000 mLs Intravenous New Bag/Given 08/10/16 0005)  0.9 %  sodium chloride infusion (not administered)  ipratropium-albuterol (DUONEB) 0.5-2.5 (3) MG/3ML nebulizer solution 3 mL (3 mLs Nebulization Given 08/09/16 2213)  methylPREDNISolone sodium succinate (SOLU-MEDROL) 125 mg/2 mL injection 125 mg (125 mg Intravenous Given 08/09/16 2223)  furosemide (LASIX) injection 80 mg (80 mg Intravenous Given 08/09/16 2243)  ipratropium-albuterol (DUONEB) 0.5-2.5 (3) MG/3ML nebulizer solution 3 mL (3 mLs Nebulization Given 08/09/16 2255)  HYDROmorphone (DILAUDID) injection 0.5 mg (0.5 mg Intravenous Given 08/10/16 0004)  ondansetron (ZOFRAN) injection 4 mg (4 mg Intravenous Given 08/10/16 0004)     Initial Impression / Assessment and Plan / ED Course  I have reviewed the triage vital signs and the nursing notes.  Pertinent labs & imaging results that were available during my care of the patient  were reviewed by me and considered in my medical decision making (see chart for details).     Patient arrived by EMS. In respiratory distress. Patient states that his shortness of breath got much worse today. Ms. been bad for a few days. Patient has a history of COPD CHF does use oxygen at home as needed. Patient arrived here working very hard to breathe. Did not receive a breathing treatment in route. Patient's room air sats were 97% but she was placed on 2 L of oxygen which gave her more comfort. Patient's had 2 nebulizer treatments with albuterol and Atrovent received Solu-Medrol. And also received 80 mg of Lasix IV. Chest x-ray did not show significant CHF also did not show pneumonia.  Patient has a history of chronic atrial fib. She is on blood thinners for this. She is on [place]. She has been taking this. Initially her heart rate was tachycardic sometimes up in the 140s was consistent with atrial fib which is her baseline rhythm. As she was treated and got more comfortable with breathing heart rate came down to around the 110s 1 teens. Blood pressure has been elevated but probably the most recent one was fairly accurate of 155/88.  Patient started on BiPAP even though she improved significantly she normally sleeps was C Pap use the BiPAP to rest her for now and could substitute for her C Pap. Serial blood gas showed a pH of 7.32 PCO2 was 47 PO2 70.  Did not  treat the atrial fibrillation feel that this will improve as the breathing improves. Blood pressure showing signs of improvement.  Patient clearly will require admission will discuss with hospitalist.  Patient's potassium is elevated but most likely hemolysis. Will recheck that.  Final Clinical Impressions(s) / ED Diagnoses   Final diagnoses:  COPD exacerbation ([place])  Congestive heart failure, unspecified congestive heart failure chronicity, unspecified congestive heart failure type ([place])  Chronic atrial fibrillation [facility])    New  Prescriptions New Prescriptions   No medications on file     Fredia Sorrow, MD 08/10/16 [phone]

## 2016-08-10 NOTE — Consult Note (Signed)
Advanced Heart Failure Team Consult Note  Referring Physician: Dr Clementeen Graham  Primary Physician: Dr Cheri Rous  Primary Cardiologist:  Dr [name]   Reason for Consultation: Heart Failure   HPI:    Heidi Stark is a 76 y.o. female with history of COPD, diabetes mellitus, PAF, hypertension. 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 TEE showed prominent aortic plaque. She was placed on Coumadin. She was eventually changed over to Eliquis. Event monitor in 4/15 showed no atrial fibrillation. 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 Current smoker.   A few days prior to admit she had increased dyspnea. SOB with exeriton. + Orthopnea. Says weight at home had gone up to 257 pounds (7 pounds above baseline). Denies fever or chills. Taking all medications. Smoking 1 PPD of cigarettes. Using CPAP nightly.   Presented to [facility] with increased dyspnea. In the ED diuresed with IV lasix and placed on short term bipap. Also given nebulizer and steroids. In the ED in A fib RVR 140s. Rate improved spontaneously and she continued on home metoprolol.  CXR- mild HF versus pneumonia. Pertinent admission labs include: creatinine 0.84 K 6.2,  WBC 15.7, Hgb 15.5, CEs negative, TSH 1.72, and BNP 315.    So far diuresed 1.9 liters with 60 mg IV lasix twice a day. Denies SOB/CP   CV studies  Event monitor 4/15 NSR, PACs  TEE 4/15 Mild LVH, EF 50-55, normal wall motion, normal caliber aorta with grade 3-4 plaque in the descending thoracic aorta, trivial MR, mild LAE, mild RAE, no right-to-left shunt  Echo 4/15 Moderate LVH, EF 50-55, normal wall motion, MAC, mild LAE, mild RAE  LHC 5/07 LM okay LAD luminal irregularities LCx okay RCA ostial spasm otherwise okay Preserved LVEF  Review of Systems: [y] = yes, _0  = no   General: Weight gain [ Y]; Weight loss _1 ;  Anorexia _2 ; Fatigue [Y ]; Fever _3 ; Chills _4 ; Weakness [Y ]  Cardiac: Chest pain/pressure _5 ; Resting SOB _6 ; Exertional SOB [ Y]; Orthopnea [ Y]; Pedal Edema _7 ; Palpitations _8 ; Syncope _9 ; Presyncope _10 ; Paroxysmal nocturnal dyspnea[Y ]  Pulmonary: Cough _11 ; Wheezing_12 ; Hemoptysis_13 ; Sputum _14 ; Snoring _15   GI: Vomiting_16 ; Dysphagia_17 ; Melena_18 ; Hematochezia _19 ; Heartburn_20 ; Abdominal pain _21 ; Constipation _22 ; Diarrhea _23 ; BRBPR _24   GU: Hematuria_25 ; Dysuria _26 ; Nocturia_27   Vascular: Pain in legs with walking _28 ; Pain in feet with lying flat _29 ; Non-healing sores _30 ; Stroke _31 ; TIA _32 ; Slurred speech _33 ;  Neuro: Headaches_34 ; Vertigo_35 ; Seizures_36 ; Paresthesias_37 ;Blurred vision _38 ; Diplopia _39 ; Vision changes _40   Ortho/Skin: Arthritis _41 ; Joint pain [ Y]; Muscle pain _42 ; Joint swelling _43 ; Back Pain _44 ; Rash _45   Psych: Depression_46 ; Anxiety_47   Heme: Bleeding problems _48 ; Clotting disorders _49 ; Anemia _50   Endocrine: Diabetes [ Y]; Thyroid dysfunction_51   Home Medications Prior to Admission medications   Medication Sig Start Date End Date Taking? Authorizing Provider  ADVAIR DISKUS 250-50 MCG/DOSE AEPB INHALE 1 PUFF INTO THE LUNGS EVERY 12 (TWELVE) HOURS. AS NEEDED FOR SHORTNESS OF BREATH 10/25/15  Yes Yvonne R Lowne Chase, DO  albuterol (PROVENTIL HFA;VENTOLIN HFA) 108 (90 Base) MCG/ACT  inhaler Inhale 2 puffs into the lungs every 6 (six) hours as needed for wheezing or shortness of breath. 10/11/15  Yes Yvonne R Lowne Chase, DO  apixaban (ELIQUIS) 5 MG TABS tablet Take 1 tablet (5 mg total) by mouth 2 (two) times daily. 10/28/15  Yes Larey Dresser, MD  fenofibrate 160 MG tablet TAKE 1 TABLET (160 MG TOTAL) BY MOUTH DAILY. 10/11/15  Yes Yvonne R Lowne Chase, DO  furosemide (LASIX) 20 MG tablet TAKE 1 TABLET BY MOUTH TWICE A DAY 06/12/16  Yes Yvonne R Lowne Chase, DO  glimepiride (AMARYL) 2 MG tablet Take 1 tablet (2 mg total) by mouth daily with  breakfast. Patient taking differently: Take 2-4 mg by mouth See admin instructions. Takes 2 tabs in am and 1 tab in pm 10/11/15  Yes Yvonne R Lowne Chase, DO  insulin glargine (LANTUS) 100 UNIT/ML injection Inject 0.15 mLs (15 Units total) into the skin at bedtime. 10 units qhs x 30 days 10/11/15  Yes Yvonne R Lowne Chase, DO  KLOR-CON 10 10 MEQ tablet TAKE 1 TABLET (10 MEQ TOTAL) BY MOUTH 2 (TWO) TIMES DAILY. 12/14/15  Yes Yvonne R Lowne Chase, DO  lisinopril (PRINIVIL,ZESTRIL) 20 MG tablet TAKE 1 TABLET BY MOUTH DAILY. MUST SCHEDULE APPOINTMENT FOR REFILLS 07/12/16  Yes Larey Dresser, MD  metFORMIN (GLUCOPHAGE) 1000 MG tablet Take 1 tablet (1,000 mg total) by mouth 2 (two) times daily with a meal. 10/11/15  Yes [name] Lowne Chase, DO  metoprolol tartrate (LOPRESSOR) 25 MG tablet TAKE 0.5 TABLETS (12.5 MG TOTAL) BY MOUTH 2 (TWO) TIMES DAILY. 10/11/15  Yes Yvonne R Lowne Chase, DO  rosuvastatin (CRESTOR) 10 MG tablet Take 1 tablet (10 mg total) by mouth daily. 10/11/15  Yes Yvonne R Lowne Chase, DO  tiZANidine (ZANAFLEX) 4 MG tablet Take 4 mg by mouth 3 (three) times daily as needed for muscle spasms. 08/07/16  Yes Historical Provider, MD    Past Medical History: Past Medical History:  Diagnosis Date  . COPD (chronic obstructive pulmonary disease) ([place])   . Diabetes mellitus    Type II  . DVT (deep venous thrombosis) ([place])   . Hyperlipidemia   . Hypertension   . PAF (paroxysmal atrial fibrillation) ([place])     Past Surgical History: Past Surgical History:  Procedure Laterality Date  . BACK SURGERY     vein graft and colon repair after nicked artery with back surgert  . COLONOSCOPY    . CYSTECTOMY     between bladder and kidneys  . GANGLION CYST EXCISION    . KNEE SURGERY     Right Knee cart.Removal  . TEE WITHOUT CARDIOVERSION N/A 10/31/2013   Procedure: TRANSESOPHAGEAL ECHOCARDIOGRAM (TEE);  Surgeon: Larey Dresser, MD;  Location: [facility] ENDOSCOPY;  Service: Cardiovascular;  Laterality:  N/A;  . TUBAL LIGATION      Family History: Family History  Problem Relation Age of Onset  . Diabetes    . Melanoma    . Factor V Leiden deficiency Daughter     Social History: Social History   Social History  . Marital status: Married    Spouse name: N/A  . Number of children: 2  . Years of education: N/A   Occupational History  . Retired    Social History Main Topics  . Smoking status: Current Every Day Smoker    Packs/day: 0.50    Types: Cigarettes    Last attempt to quit: 09/14/2005  . Smokeless tobacco: Never Used  .  Alcohol use No  . Drug use: No  . Sexual activity: Not Asked   Other Topics Concern  . None   Social History Narrative  . None    Allergies:  Allergies  Allergen Reactions  . Neomycin-Bacitracin Zn-Polymyx Rash  . Sulfonamide Derivatives Other (See Comments)    Stomach cramps  . Ciprofloxacin Hives, Itching and Rash    Objective:    Vital Signs:   Temp:  [97.6 F (36.4 C)-99.8 F (37.7 C)] 97.6 F (36.4 C) (01/25 0731) Pulse Rate:  [72-131] 94 (01/25 0900) Resp:  [16-28] 18 (01/25 0900) BP: (116-191)/(73-149) 169/73 (01/25 0900) SpO2:  [92 %-98 %] 97 % (01/25 0900) Weight:  [257 lb 15 oz (117 kg)] 257 lb 15 oz (117 kg) (01/25 0500) Last BM Date: 08/08/16  Weight change: Filed Weights   08/10/16 0500  Weight: 257 lb 15 oz (117 kg)    Intake/Output:   Intake/Output Summary (Last 24 hours) at 08/10/16 1002 Last data filed at 08/10/16 0935  Gross per 24 hour  Intake              400 ml  Output             2200 ml  Net            -1800 ml     Physical Exam: General:  Well appearing. No resp difficulty. In bed  HEENT: normal Neck: supple. JVP 12. Carotids 2+ bilat; no bruits. No lymphadenopathy or thryomegaly appreciated. Cor: PMI nondisplaced. Irregular rate & rhythm. No rubs, gallops or murmurs. Lungs: EW throughout on 2 liters [place] oxygen Abdomen: obese, soft, nontender, nondistended. No hepatosplenomegaly. No bruits or  masses. Good bowel sounds. Extremities: no cyanosis, clubbing, rash, R and LLE no edema.  Neuro: alert & orientedx3, cranial nerves grossly intact. moves all 4 extremities w/o difficulty. Affect pleasant  Telemetry:  A fib 80s   Labs: Basic Metabolic Panel:  Recent Labs Lab 08/09/16 2149 08/10/16 0026 08/10/16 0419  NA 135 136 138  K 6.2* 3.7 3.8  CL 98* 97* 96*  CO2 _0 GLUCOSE 282* 333* 355*  BUN _1 CREATININE 0.84 0.91 0.95  CALCIUM 8.8* 8.7* 8.6*  MG  --   --  1.8    Liver Function Tests:  Recent Labs Lab 08/10/16 0026  AST 34  ALT 27  ALKPHOS 44  BILITOT 0.5  PROT 7.5  ALBUMIN 3.8   No results for input(s): LIPASE, AMYLASE in the last 168 hours. No results for input(s): AMMONIA in the last 168 hours.  CBC:  Recent Labs Lab 08/09/16 2149 08/10/16 0419  WBC 15.7* 11.9*  HGB 15.5* 14.4  HCT 47.1* 44.6  MCV 89.4 89.9  PLT 316 271    Cardiac Enzymes: No results for input(s): CKTOTAL, CKMB, CKMBINDEX, TROPONINI in the last 168 hours.  BNP: BNP (last 3 results)  Recent Labs  08/09/16 2149  BNP 315.4*    ProBNP (last 3 results) No results for input(s): PROBNP in the last 8760 hours.   CBG:  Recent Labs Lab 08/10/16 0323 08/10/16 0729  GLUCAP 330* 327*    Coagulation Studies: No results for input(s): LABPROT, INR in the last 72 hours.  Other results: EKG: A fib RVR 140 bpm  Imaging: Dg Chest Port 1 View  Result Date: 08/09/2016 CLINICAL DATA:  76 year old female with shortness of breath. History of asthma and COPD. EXAM: PORTABLE CHEST 1 VIEW COMPARISON:  Chest radiograph dated 10/30/2013  FINDINGS: There is stable cardiomegaly. Increased central vascular and interstitial prominence compatible with congestive changes. There is no focal consolidation, pleural effusion, or pneumothorax. No acute osseous pathology. IMPRESSION: Cardiomegaly with mild CHF. Pneumonia is not excluded. Clinical correlation is recommended.  Electronically Signed   By: Arash  Radparvar M.D.   On: 08/09/2016 22:28      Medications:     Current Medications: . apixaban  5 mg Oral BID  . budesonide (PULMICORT) nebulizer solution  0.25 mg Nebulization BID  . fenofibrate  160 mg Oral Daily  . furosemide  60 mg Intravenous Q12H  . glimepiride  4 mg Oral Q breakfast  . insulin aspart  0-20 Units Subcutaneous TID WC  . insulin aspart  5 Units Subcutaneous TID WC  . insulin glargine  15 Units Subcutaneous QHS  . levalbuterol  0.63 mg Nebulization Q6H  . lisinopril  20 mg Oral Daily  . metoprolol tartrate  12.5 mg Oral BID  . potassium chloride  10 mEq Oral Daily  . rosuvastatin  10 mg Oral Daily     Infusions:    Assessment/Plan/Discussion  Ms Jesus is a 75 year old admitted with increased dyspnea in the setting of volume overload, COPD exacerbation, and A fib RVR. CXR concerning for HF versus pneumonia. WBC elevated on admit to 15.7 but down to 11. Likely more of volume overload.   1. Acute Respiratory Failure with hypoxia  Initially on bipap but weaned to 2 liters nasal cannula. Stable today/   2. A/C Diastolic Heart Failure Volume overloaded but improving with diuresis. Continue IV lasix 60 mg twice a day. Add 12.5 mg spiro daily. Renal function ok.   Check ECHO now.  3. A Fib RVR Initially A fib RVR but today with controlled rate. Increase metoprolol tartrateto 25 mg twice a day. She is not acutely decompensated. May need to switch bb to Toprol XL if EF down.  Continue eliquis 5 mg twice a day. She has not missed any doses.  Need to try and establish normal rhythm. Can consider DC-CV tomorrow if she remains in A fib. EKG now.  4. COPD- Exacerbation Received steroids and nebulizer.  5. HTN- As above increase bb and add spiro . Continue current dose of lisinopril.  6. Atherosclerosis of Aorta-  On statin  Length of Stay: 0  Amy Clegg NP-C  08/10/2016, 10:02 AM  Advanced Heart Failure Team Pager [phone] (M-F;  7a - 4p)  Please contact [facility] for night-coverage after hours (4p -7a ) and weekends on [email or web address]  Patient seen with NP, agree with the above note.  Echo reviewed, EF down to 30-35% with diffuse hypokinesis.  1. Acute on chronic systolic CHF: EF 30-35% with diffuse hypokinesis on echo.  She is volume overloaded on exam.  Suspect this corresponds to the onset of atrial fibrillation with RVR. 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  No regional WMAs and no chest pain.  - Lasix 80 mg IV bid.  - Transition from lisinopril to losartan with plan to eventually use Entresto.  - Toprol XL 25 mg bid. - Add spironolactone 12.5 daily.  - Will repeat echo a couple of months after she is back in NSR to see if EF improves.  If not, she will need left heart cath (will need one sooner if any indication of ischemia).  2. Atrial fibrillation:  H/o paroxysmal atrial fibrillation.  Has not had an episode in a while.  Admitted with atrial fibrillation/RVR, probably in   atrial fibrillation for 3-4 days now based on symptoms. She has been on Eliquis without missing doses.  - Start amiodarone gtt to see if she will convert overnight. Given COPD, would avoid long-term amiodarone use.  - If she remains in atrial fibrillation tomorrow, will arrange for DCCV. - Toprol XL 25 mg bid, HR now controlled.  3. COPD: Possible COPD exacerbation, she is on steroids/nebs per primary service.   [name] 08/10/2016 3:08 PM

## 2016-08-10 NOTE — ED Notes (Signed)
ED Provider at bedside. 

## 2016-08-11 ENCOUNTER — Encounter (HOSPITAL_COMMUNITY)
Admission: EM | Disposition: A | Discharge status: Home / Self Care | Payer: Self-pay | Source: Home / Self Care | Attending: Internal Medicine

## 2016-08-11 ENCOUNTER — Encounter (HOSPITAL_COMMUNITY): Payer: Self-pay | Admitting: *Deleted

## 2016-08-11 ENCOUNTER — Inpatient Hospital Stay (HOSPITAL_COMMUNITY): Payer: Medicare Other | Admitting: Anesthesiology

## 2016-08-11 ENCOUNTER — Encounter: Payer: Self-pay | Admitting: Cardiology

## 2016-08-11 DIAGNOSIS — I4891 Unspecified atrial fibrillation: Secondary | ICD-10-CM

## 2016-08-11 HISTORY — PX: CARDIOVERSION: SHX1299

## 2016-08-11 LAB — GLUCOSE, CAPILLARY
GLUCOSE-CAPILLARY: 189 mg/dL — AB (ref 65–99)
GLUCOSE-CAPILLARY: 151 mg/dL — AB (ref 65–99)
Glucose-Capillary: 188 mg/dL — ABNORMAL HIGH (ref 65–99)

## 2016-08-11 LAB — BASIC METABOLIC PANEL
Anion gap: 12 (ref 5–15)
BUN: 20 mg/dL (ref 6–20)
CHLORIDE: 95 mmol/L — AB (ref 101–111)
CO2: 32 mmol/L (ref 22–32)
Calcium: 8.5 mg/dL — ABNORMAL LOW (ref 8.9–10.3)
Creatinine, Ser: 0.87 mg/dL (ref 0.44–1.00)
GFR calc Af Amer: >60 mL/min (ref >60)
GFR calc non Af Amer: >60 mL/min (ref >60)
GLUCOSE: 149 mg/dL — AB (ref 65–99)
Potassium: 4.2 mmol/L (ref 3.5–5.1)
Sodium: 139 mmol/L (ref 135–145)

## 2016-08-11 LAB — CBC
HEMATOCRIT: 44.4 % (ref 36.0–46.0)
HEMOGLOBIN: 13.9 g/dL (ref 12.0–15.0)
MCH: 28.7 pg (ref 26.0–34.0)
MCHC: 31.3 g/dL (ref 30.0–36.0)
MCV: 91.5 fL (ref 78.0–100.0)
PLATELETS: 288 10*3/uL (ref 150–400)
RBC: 4.85 MIL/uL (ref 3.87–5.11)
RDW: 15.9 % — ABNORMAL HIGH (ref 11.5–15.5)
WBC: 14 10*3/uL — ABNORMAL HIGH (ref 4.0–10.5)

## 2016-08-11 LAB — MAGNESIUM: Magnesium: 2.4 mg/dL (ref 1.7–2.4)

## 2016-08-11 SURGERY — CARDIOVERSION
Anesthesia: Monitor Anesthesia Care

## 2016-08-11 MED ORDER — PROPOFOL 10 MG/ML IV BOLUS
INTRAVENOUS | Status: DC | PRN
  Administered 2016-08-11: 70 mg via INTRAVENOUS

## 2016-08-11 MED ORDER — BISACODYL 5 MG PO TBEC
5.0000 mg | DELAYED_RELEASE_TABLET | Freq: Every day | ORAL | Status: DC | PRN
  Administered 2016-08-11 – 2016-08-12 (×2): 5 mg via ORAL
  Filled 2016-08-11 (×2): qty 1

## 2016-08-11 MED ORDER — LIDOCAINE HCL (CARDIAC) 20 MG/ML IV SOLN
INTRAVENOUS | Status: DC | PRN
  Administered 2016-08-11: 100 mg via INTRATRACHEAL

## 2016-08-11 MED ORDER — POTASSIUM CHLORIDE CRYS ER 20 MEQ PO TBCR
20.0000 meq | EXTENDED_RELEASE_TABLET | Freq: Once | ORAL | Status: AC
  Administered 2016-08-11: 20 meq via ORAL
  Filled 2016-08-11: qty 1

## 2016-08-11 MED ORDER — IPRATROPIUM-ALBUTEROL 0.5-2.5 (3) MG/3ML IN SOLN
3.0000 mL | RESPIRATORY_TRACT | Status: DC
  Administered 2016-08-11 (×2): 3 mL via RESPIRATORY_TRACT
  Filled 2016-08-11 (×3): qty 3

## 2016-08-11 MED ORDER — METOLAZONE 2.5 MG PO TABS
2.5000 mg | ORAL_TABLET | Freq: Once | ORAL | Status: AC
  Administered 2016-08-11: 2.5 mg via ORAL
  Filled 2016-08-11: qty 1

## 2016-08-11 NOTE — Anesthesia Preprocedure Evaluation (Signed)
Anesthesia Evaluation  Patient identified by MRN, date of birth, ID band Patient awake    Reviewed: Allergy & Precautions, NPO status , Patient's Chart, lab work & pertinent test results  Airway Mallampati: II  TM Distance: >3 FB Neck ROM: Full    Dental no notable dental hx.    Pulmonary sleep apnea , COPD, Current Smoker,    Pulmonary exam normal breath sounds clear to auscultation       Cardiovascular hypertension, +CHF  Normal cardiovascular exam+ dysrhythmias Atrial Fibrillation  Rhythm:Regular Rate:Normal     Neuro/Psych negative neurological ROS  negative psych ROS   GI/Hepatic negative GI ROS, Neg liver ROS,   Endo/Other  diabetes, Type 2  Renal/GU negative Renal ROS  negative genitourinary   Musculoskeletal negative musculoskeletal ROS (+)   Abdominal   Peds negative pediatric ROS (+)  Hematology negative hematology ROS (+)   Anesthesia Other Findings   Reproductive/Obstetrics negative OB ROS                             Anesthesia Physical Anesthesia Plan  ASA: III  Anesthesia Plan: General   Post-op Pain Management:    Induction: Intravenous  Airway Management Planned: Mask  Additional Equipment:   Intra-op Plan:   Post-operative Plan:   Informed Consent: I have reviewed the patients History and Physical, chart, labs and discussed the procedure including the risks, benefits and alternatives for the proposed anesthesia with the patient or authorized representative who has indicated his/her understanding and acceptance.   Dental advisory given  Plan Discussed with: CRNA  Anesthesia Plan Comments:         Anesthesia Quick Evaluation

## 2016-08-11 NOTE — Progress Notes (Signed)
PROGRESS NOTE                                                                                                                                                                                                             Patient Demographics:    Heidi Stark, is a 76 y.o. female, DOB - 01-17-1941, [id number]  Admit date - 08/09/2016   Admitting Physician Rise Patience, MD  Outpatient Primary MD for the patient is Ann Held, DO  LOS - 1  Outpatient Specialists: Dr [name] ( cardiology)  Chief Complaint  Patient presents with  . Shortness of Breath       Brief Narrative   76 year old female with history of COPD (on home O2 at night), paroxysmal A. fib on antibiotics, diabetes mellitus and hypertension presented with shortness of breath for past few days worsened on exertion and having difficulty lying flat. She had again almost 7 pounds from her baseline and was found to be in acute CHF with rapid A. fib. Admitted to stepdown unit requiring BiPAP on admission.   Subjective:   Still wheezy.   Assessment  & Plan :    Principal Problem:   Acute respiratory failure with hypoxia ([place]) Secondary to acute CHF exacerbation.Continue IV Lasix. Added metolazone. Strict I/O. Echo shows worsening EF of 30-35%. Maintaining O2 sat on 3 L via nasal cannula. Heart failure team following. Added low dose Aldactone. Continue ACE inhibitor.   Active Problems:    Atrial fibrillation with rapid ventricular response (HCC) Placed on amiodarone drip overnight without converting to sinus. Underwent successful DC cardioversion. Plan on continuing IV amiodarone for today and switch to by mouth tomorrow.  Acute chronic systolic CHF Worsening EF of 30-35% with diffuse hypokinesis on 2-D echo. Likely contributed by rapid A. fib. Continue IV Lasix, added metolazone and Aldactone. Monitor renal function and potassium. Continue  losartan. Continue Toprol.  Cardiology recommend repeat 2-D echo in 2 months and if not improved she will need left heart catheterization.    COPD (chronic obstructive pulmonary disease) ([place])  with ?mild acute exacerbation. Continue nebulizer. Hold off on steroids due to heparin glycemia.    Type 2 diabetes mellitus with vascular disease (HCC) CBG elevated possibly after receiving steroid in the ED. A1c of 7.7. Now stable on resistance sliding scale coverage and bedtime Lantus.  Obstructive sleep apnea Continue nighttime CPAP.    Essential hypertension Stable. Continue home medications        Code Status : Full code  Family Communication  : None at bedside  Disposition Plan  : Home soon in the next 48 hours if diabetes adequately.  Barriers For Discharge : Active symptoms  Consults  :  Heart failure  Procedures  :  2-D echo  DC cardioversion.  DVT Prophylaxis  : eliquis  Lab Results  Component Value Date   PLT 288 08/11/2016    Antibiotics  :   Anti-infectives    None        Objective:   Vitals:   08/11/16 1128 08/11/16 1240 08/11/16 1250 08/11/16 1255  BP: 124/77 (!) 94/56 (!) 103/50 104/62  Pulse: 84 63 (!) 57 (!) 54  Resp: (!) 22 (!) 21 (!) 23 19  Temp: 98.1 F (36.7 C)   98 F (36.7 C)  TempSrc: Oral     SpO2: 97% 98% 98% 97%  Weight:        Wt Readings from Last 3 Encounters:  08/11/16 117.6 kg (259 lb 4.2 oz)  06/26/16 113 kg (249 lb 1.9 oz)  05/27/16 112.9 kg (249 lb)     Intake/Output Summary (Last 24 hours) at 08/11/16 1325 Last data filed at 08/11/16 1240  Gross per 24 hour  Intake               75 ml  Output                0 ml  Net               75 ml     Physical Exam  Gen: Appears fatigued HEENT: moist mucosa, supple neck Chest: Diffuse wheezing bilaterally CVS:  S1 and S2 irregular, no murmurs rub or gallop  GI: soft, NT, ND,  Musculoskeletal: warm,trace edema bilaterally      Data Review:    CBC  Recent  Labs Lab 08/09/16 2149 08/10/16 0419 08/11/16 0237  WBC 15.7* 11.9* 14.0*  HGB 15.5* 14.4 13.9  HCT 47.1* 44.6 44.4  PLT 316 271 288  MCV 89.4 89.9 91.5  MCH 29.4 29.0 28.7  MCHC 32.9 32.3 31.3  RDW 16.2* 16.1* 15.9*    Chemistries   Recent Labs Lab 08/09/16 2149 08/10/16 0026 08/10/16 0419 08/11/16 0237  NA 135 136 138 139  K 6.2* 3.7 3.8 4.2  CL 98* 97* 96* 95*  CO2 23 22 27  32  GLUCOSE 282* 333* 355* 149*  BUN 17 17 15 20   CREATININE 0.84 0.91 0.95 0.87  CALCIUM 8.8* 8.7* 8.6* 8.5*  MG  --   --  1.8 2.4  AST  --  34  --   --   ALT  --  27  --   --   ALKPHOS  --  44  --   --   BILITOT  --  0.5  --   --    ------------------------------------------------------------------------------------------------------------------ No results for input(s): CHOL, HDL, LDLCALC, TRIG, CHOLHDL, LDLDIRECT in the last 72 hours.  Lab Results  Component Value Date   HGBA1C 7.7 (H) 10/11/2015   ------------------------------------------------------------------------------------------------------------------  Recent Labs  08/10/16 0419  TSH 1.725   ------------------------------------------------------------------------------------------------------------------ No results for input(s): VITAMINB12, FOLATE, FERRITIN, TIBC, IRON, RETICCTPCT in the last 72 hours.  Coagulation profile No results for input(s): INR, PROTIME in the last 168 hours.  No results for input(s): DDIMER in the last 72  hours.  Cardiac Enzymes No results for input(s): CKMB, TROPONINI, MYOGLOBIN in the last 168 hours.  Invalid input(s): CK ------------------------------------------------------------------------------------------------------------------    Component Value Date/Time   BNP 315.4 (H) 08/09/2016 2149    Inpatient Medications  Scheduled Meds: . apixaban  5 mg Oral BID  . budesonide (PULMICORT) nebulizer solution  0.25 mg Nebulization BID  . fenofibrate  160 mg Oral Daily  . furosemide   80 mg Intravenous BID  . glimepiride  4 mg Oral Q breakfast  . insulin aspart  0-20 Units Subcutaneous TID WC  . insulin glargine  15 Units Subcutaneous QHS  . ipratropium-albuterol  3 mL Nebulization Q4H  . levalbuterol  0.63 mg Nebulization TID  . losartan  50 mg Oral Daily  . metoprolol succinate  25 mg Oral BID  . potassium chloride  10 mEq Oral Daily  . rosuvastatin  10 mg Oral Daily  . sodium chloride flush  3 mL Intravenous Q12H  . spironolactone  12.5 mg Oral Daily   Continuous Infusions: . sodium chloride Stopped (08/11/16 1254)  . amiodarone 30 mg/hr (08/11/16 1314)   PRN Meds:.acetaminophen **OR** acetaminophen, levalbuterol, ondansetron **OR** ondansetron (ZOFRAN) IV, oxyCODONE, sodium chloride flush, tiZANidine  Micro Results Recent Results (from the past 240 hour(s))  MRSA PCR Screening     Status: None   Collection Time: 08/10/16  3:37 AM  Result Value Ref Range Status   MRSA by PCR NEGATIVE NEGATIVE Final    Comment:        The GeneXpert MRSA Assay (FDA approved for NASAL specimens only), is one component of a comprehensive MRSA colonization surveillance program. It is not intended to diagnose MRSA infection nor to guide or monitor treatment for MRSA infections.     Radiology Reports Dg Chest Port 1 View  Result Date: 08/09/2016 CLINICAL DATA:  76 year old female with shortness of breath. History of asthma and COPD. EXAM: PORTABLE CHEST 1 VIEW COMPARISON:  Chest radiograph dated 10/30/2013 FINDINGS: There is stable cardiomegaly. Increased central vascular and interstitial prominence compatible with congestive changes. There is no focal consolidation, pleural effusion, or pneumothorax. No acute osseous pathology. IMPRESSION: Cardiomegaly with mild CHF. Pneumonia is not excluded. Clinical correlation is recommended. Electronically Signed   By: Anner Crete M.D.   On: 08/09/2016 22:28    Time Spent in minutes  35   Louellen Molder M.D on 08/11/2016 at  1:25 PM  Between 7am to 7pm - Pager - [phone]  After 7pm go to [email or web address] - password [facility]  [facility] -  Office  [phone]

## 2016-08-11 NOTE — Procedures (Signed)
Electrical Cardioversion Procedure Note Heidi Stark [id number] 1941-06-14  Procedure: Electrical Cardioversion Indications:  Atrial Fibrillation.  She has been on Eliquis without missing doses.   Procedure Details Consent: Risks of procedure as well as the alternatives and risks of each were explained to the (patient/caregiver).  Consent for procedure obtained. Time Out: Verified patient identification, verified procedure, site/side was marked, verified correct patient position, special equipment/implants available, medications/allergies/relevent history reviewed, required imaging and test results available.  Performed  Patient placed on cardiac monitor, pulse oximetry, supplemental oxygen as necessary.  Sedation given: Propofol per anesthesiology Pacer pads placed anterior and posterior chest.  Cardioverted 1 time(s).  Cardioverted at [place].  Evaluation Findings: Post procedure EKG shows: NSR Complications: None Patient did tolerate procedure well.   Heidi Stark 08/11/2016, 12:38 PM

## 2016-08-11 NOTE — Anesthesia Postprocedure Evaluation (Signed)
Anesthesia Post Note  Patient: Heidi Stark  Procedure(s) Performed: Procedure(s) (LRB): CARDIOVERSION (N/A)  Patient location during evaluation: Endoscopy Anesthesia Type: General Level of consciousness: awake and alert Pain management: pain level controlled Vital Signs Assessment: post-procedure vital signs reviewed and stable Respiratory status: spontaneous breathing, nonlabored ventilation, respiratory function stable and patient connected to nasal cannula oxygen Cardiovascular status: blood pressure returned to baseline and stable Postop Assessment: no signs of nausea or vomiting Anesthetic complications: no       Last Vitals:  Vitals:   08/11/16 1250 08/11/16 1255  BP: (!) 103/50 104/62  Pulse: (!) 57 (!) 54  Resp: (!) 23 19  Temp:  36.7 C    Last Pain:  Vitals:   08/11/16 1128  TempSrc: Oral  PainSc:                  Montez Hageman

## 2016-08-11 NOTE — H&P (View-Only) (Signed)
Advanced Heart Failure Team Consult Note  Referring Physician: Dr Clementeen Graham  Primary Physician: Dr Cheri Rous  Primary Cardiologist:  Dr [name]   Reason for Consultation: Heart Failure   HPI:    Heidi Stark is a 76 y.o. female with history of COPD, diabetes mellitus, PAF, hypertension. 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 TEE showed prominent aortic plaque. She was placed on Coumadin. She was eventually changed over to Eliquis. Event monitor in 4/15 showed no atrial fibrillation. 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 Current smoker.   A few days prior to admit she had increased dyspnea. SOB with exeriton. + Orthopnea. Says weight at home had gone up to 257 pounds (7 pounds above baseline). Denies fever or chills. Taking all medications. Smoking 1 PPD of cigarettes. Using CPAP nightly.   Presented to [facility] with increased dyspnea. In the ED diuresed with IV lasix and placed on short term bipap. Also given nebulizer and steroids. In the ED in A fib RVR 140s. Rate improved spontaneously and she continued on home metoprolol.  CXR- mild HF versus pneumonia. Pertinent admission labs include: creatinine 0.84 K 6.2,  WBC 15.7, Hgb 15.5, CEs negative, TSH 1.72, and BNP 315.    So far diuresed 1.9 liters with 60 mg IV lasix twice a day. Denies SOB/CP   CV studies  Event monitor 4/15 NSR, PACs  TEE 4/15 Mild LVH, EF 50-55, normal wall motion, normal caliber aorta with grade 3-4 plaque in the descending thoracic aorta, trivial MR, mild LAE, mild RAE, no right-to-left shunt  Echo 4/15 Moderate LVH, EF 50-55, normal wall motion, MAC, mild LAE, mild RAE  LHC 5/07 LM okay LAD luminal irregularities LCx okay RCA ostial spasm otherwise okay Preserved LVEF  Review of Systems: [y] = yes, _0  = no   General: Weight gain [ Y]; Weight loss _1 ;  Anorexia _2 ; Fatigue [Y ]; Fever _3 ; Chills _4 ; Weakness [Y ]  Cardiac: Chest pain/pressure _5 ; Resting SOB _6 ; Exertional SOB [ Y]; Orthopnea [ Y]; Pedal Edema _7 ; Palpitations _8 ; Syncope _9 ; Presyncope _10 ; Paroxysmal nocturnal dyspnea[Y ]  Pulmonary: Cough _11 ; Wheezing_12 ; Hemoptysis_13 ; Sputum _14 ; Snoring _15   GI: Vomiting_16 ; Dysphagia_17 ; Melena_18 ; Hematochezia _19 ; Heartburn_20 ; Abdominal pain _21 ; Constipation _22 ; Diarrhea _23 ; BRBPR _24   GU: Hematuria_25 ; Dysuria _26 ; Nocturia_27   Vascular: Pain in legs with walking _28 ; Pain in feet with lying flat _29 ; Non-healing sores _30 ; Stroke _31 ; TIA _32 ; Slurred speech _33 ;  Neuro: Headaches_34 ; Vertigo_35 ; Seizures_36 ; Paresthesias_37 ;Blurred vision _38 ; Diplopia _39 ; Vision changes _40   Ortho/Skin: Arthritis _41 ; Joint pain [ Y]; Muscle pain _42 ; Joint swelling _43 ; Back Pain _44 ; Rash _45   Psych: Depression_46 ; Anxiety_47   Heme: Bleeding problems _48 ; Clotting disorders _49 ; Anemia _50   Endocrine: Diabetes [ Y]; Thyroid dysfunction_51   Home Medications Prior to Admission medications   Medication Sig Start Date End Date Taking? Authorizing Provider  ADVAIR DISKUS 250-50 MCG/DOSE AEPB INHALE 1 PUFF INTO THE LUNGS EVERY 12 (TWELVE) HOURS. AS NEEDED FOR SHORTNESS OF BREATH 10/25/15  Yes Yvonne R Lowne Chase, DO  albuterol (PROVENTIL HFA;VENTOLIN HFA) 108 (90 Base) MCG/ACT  inhaler Inhale 2 puffs into the lungs every 6 (six) hours as needed for wheezing or shortness of breath. 10/11/15  Yes Yvonne R Lowne Chase, DO  apixaban (ELIQUIS) 5 MG TABS tablet Take 1 tablet (5 mg total) by mouth 2 (two) times daily. 10/28/15  Yes Larey Dresser, MD  fenofibrate 160 MG tablet TAKE 1 TABLET (160 MG TOTAL) BY MOUTH DAILY. 10/11/15  Yes Yvonne R Lowne Chase, DO  furosemide (LASIX) 20 MG tablet TAKE 1 TABLET BY MOUTH TWICE A DAY 06/12/16  Yes Yvonne R Lowne Chase, DO  glimepiride (AMARYL) 2 MG tablet Take 1 tablet (2 mg total) by mouth daily with  breakfast. Patient taking differently: Take 2-4 mg by mouth See admin instructions. Takes 2 tabs in am and 1 tab in pm 10/11/15  Yes Yvonne R Lowne Chase, DO  insulin glargine (LANTUS) 100 UNIT/ML injection Inject 0.15 mLs (15 Units total) into the skin at bedtime. 10 units qhs x 30 days 10/11/15  Yes Yvonne R Lowne Chase, DO  KLOR-CON 10 10 MEQ tablet TAKE 1 TABLET (10 MEQ TOTAL) BY MOUTH 2 (TWO) TIMES DAILY. 12/14/15  Yes Yvonne R Lowne Chase, DO  lisinopril (PRINIVIL,ZESTRIL) 20 MG tablet TAKE 1 TABLET BY MOUTH DAILY. MUST SCHEDULE APPOINTMENT FOR REFILLS 07/12/16  Yes Larey Dresser, MD  metFORMIN (GLUCOPHAGE) 1000 MG tablet Take 1 tablet (1,000 mg total) by mouth 2 (two) times daily with a meal. 10/11/15  Yes [name] Lowne Chase, DO  metoprolol tartrate (LOPRESSOR) 25 MG tablet TAKE 0.5 TABLETS (12.5 MG TOTAL) BY MOUTH 2 (TWO) TIMES DAILY. 10/11/15  Yes Yvonne R Lowne Chase, DO  rosuvastatin (CRESTOR) 10 MG tablet Take 1 tablet (10 mg total) by mouth daily. 10/11/15  Yes Yvonne R Lowne Chase, DO  tiZANidine (ZANAFLEX) 4 MG tablet Take 4 mg by mouth 3 (three) times daily as needed for muscle spasms. 08/07/16  Yes Historical Provider, MD    Past Medical History: Past Medical History:  Diagnosis Date  . COPD (chronic obstructive pulmonary disease) ([place])   . Diabetes mellitus    Type II  . DVT (deep venous thrombosis) ([place])   . Hyperlipidemia   . Hypertension   . PAF (paroxysmal atrial fibrillation) ([place])     Past Surgical History: Past Surgical History:  Procedure Laterality Date  . BACK SURGERY     vein graft and colon repair after nicked artery with back surgert  . COLONOSCOPY    . CYSTECTOMY     between bladder and kidneys  . GANGLION CYST EXCISION    . KNEE SURGERY     Right Knee cart.Removal  . TEE WITHOUT CARDIOVERSION N/A 10/31/2013   Procedure: TRANSESOPHAGEAL ECHOCARDIOGRAM (TEE);  Surgeon: Larey Dresser, MD;  Location: [facility] ENDOSCOPY;  Service: Cardiovascular;  Laterality:  N/A;  . TUBAL LIGATION      Family History: Family History  Problem Relation Age of Onset  . Diabetes    . Melanoma    . Factor V Leiden deficiency Daughter     Social History: Social History   Social History  . Marital status: Married    Spouse name: N/A  . Number of children: 2  . Years of education: N/A   Occupational History  . Retired    Social History Main Topics  . Smoking status: Current Every Day Smoker    Packs/day: 0.50    Types: Cigarettes    Last attempt to quit: 09/14/2005  . Smokeless tobacco: Never Used  .  Alcohol use No  . Drug use: No  . Sexual activity: Not Asked   Other Topics Concern  . None   Social History Narrative  . None    Allergies:  Allergies  Allergen Reactions  . Neomycin-Bacitracin Zn-Polymyx Rash  . Sulfonamide Derivatives Other (See Comments)    Stomach cramps  . Ciprofloxacin Hives, Itching and Rash    Objective:    Vital Signs:   Temp:  [97.6 F (36.4 C)-99.8 F (37.7 C)] 97.6 F (36.4 C) (01/25 0731) Pulse Rate:  [72-131] 94 (01/25 0900) Resp:  [16-28] 18 (01/25 0900) BP: (116-191)/(73-149) 169/73 (01/25 0900) SpO2:  [92 %-98 %] 97 % (01/25 0900) Weight:  [257 lb 15 oz (117 kg)] 257 lb 15 oz (117 kg) (01/25 0500) Last BM Date: 08/08/16  Weight change: Filed Weights   08/10/16 0500  Weight: 257 lb 15 oz (117 kg)    Intake/Output:   Intake/Output Summary (Last 24 hours) at 08/10/16 1002 Last data filed at 08/10/16 0935  Gross per 24 hour  Intake              400 ml  Output             2200 ml  Net            -1800 ml     Physical Exam: General:  Well appearing. No resp difficulty. In bed  HEENT: normal Neck: supple. JVP 12. Carotids 2+ bilat; no bruits. No lymphadenopathy or thryomegaly appreciated. Cor: PMI nondisplaced. Irregular rate & rhythm. No rubs, gallops or murmurs. Lungs: EW throughout on 2 liters [place] oxygen Abdomen: obese, soft, nontender, nondistended. No hepatosplenomegaly. No bruits or  masses. Good bowel sounds. Extremities: no cyanosis, clubbing, rash, R and LLE no edema.  Neuro: alert & orientedx3, cranial nerves grossly intact. moves all 4 extremities w/o difficulty. Affect pleasant  Telemetry:  A fib 80s   Labs: Basic Metabolic Panel:  Recent Labs Lab 08/09/16 2149 08/10/16 0026 08/10/16 0419  NA 135 136 138  K 6.2* 3.7 3.8  CL 98* 97* 96*  CO2 _0 GLUCOSE 282* 333* 355*  BUN _1 CREATININE 0.84 0.91 0.95  CALCIUM 8.8* 8.7* 8.6*  MG  --   --  1.8    Liver Function Tests:  Recent Labs Lab 08/10/16 0026  AST 34  ALT 27  ALKPHOS 44  BILITOT 0.5  PROT 7.5  ALBUMIN 3.8   No results for input(s): LIPASE, AMYLASE in the last 168 hours. No results for input(s): AMMONIA in the last 168 hours.  CBC:  Recent Labs Lab 08/09/16 2149 08/10/16 0419  WBC 15.7* 11.9*  HGB 15.5* 14.4  HCT 47.1* 44.6  MCV 89.4 89.9  PLT 316 271    Cardiac Enzymes: No results for input(s): CKTOTAL, CKMB, CKMBINDEX, TROPONINI in the last 168 hours.  BNP: BNP (last 3 results)  Recent Labs  08/09/16 2149  BNP 315.4*    ProBNP (last 3 results) No results for input(s): PROBNP in the last 8760 hours.   CBG:  Recent Labs Lab 08/10/16 0323 08/10/16 0729  GLUCAP 330* 327*    Coagulation Studies: No results for input(s): LABPROT, INR in the last 72 hours.  Other results: EKG: A fib RVR 140 bpm  Imaging: Dg Chest Port 1 View  Result Date: 08/09/2016 CLINICAL DATA:  76 year old female with shortness of breath. History of asthma and COPD. EXAM: PORTABLE CHEST 1 VIEW COMPARISON:  Chest radiograph dated 10/30/2013  FINDINGS: There is stable cardiomegaly. Increased central vascular and interstitial prominence compatible with congestive changes. There is no focal consolidation, pleural effusion, or pneumothorax. No acute osseous pathology. IMPRESSION: Cardiomegaly with mild CHF. Pneumonia is not excluded. Clinical correlation is recommended.  Electronically Signed   By: Anner Crete M.D.   On: 08/09/2016 22:28      Medications:     Current Medications: . apixaban  5 mg Oral BID  . budesonide (PULMICORT) nebulizer solution  0.25 mg Nebulization BID  . fenofibrate  160 mg Oral Daily  . furosemide  60 mg Intravenous Q12H  . glimepiride  4 mg Oral Q breakfast  . insulin aspart  0-20 Units Subcutaneous TID WC  . insulin aspart  5 Units Subcutaneous TID WC  . insulin glargine  15 Units Subcutaneous QHS  . levalbuterol  0.63 mg Nebulization Q6H  . lisinopril  20 mg Oral Daily  . metoprolol tartrate  12.5 mg Oral BID  . potassium chloride  10 mEq Oral Daily  . rosuvastatin  10 mg Oral Daily     Infusions:    Assessment/Plan/Discussion  Ms Ziesmer is a 76 year old admitted with increased dyspnea in the setting of volume overload, COPD exacerbation, and A fib RVR. CXR concerning for HF versus pneumonia. WBC elevated on admit to 15.7 but down to 11. Likely more of volume overload.   1. Acute Respiratory Failure with hypoxia  Initially on bipap but weaned to 2 liters nasal cannula. Stable today/   2. A/C Diastolic Heart Failure Volume overloaded but improving with diuresis. Continue IV lasix 60 mg twice a day. Add 12.5 mg spiro daily. Renal function ok.   Check ECHO now.  3. A Fib RVR Initially A fib RVR but today with controlled rate. Increase metoprolol tartrateto 25 mg twice a day. She is not acutely decompensated. May need to switch bb to Toprol XL if EF down.  Continue eliquis 5 mg twice a day. She has not missed any doses.  Need to try and establish normal rhythm. Can consider DC-CV tomorrow if she remains in A fib. EKG now.  4. COPD- Exacerbation Received steroids and nebulizer.  5. HTN- As above increase bb and add spiro . Continue current dose of lisinopril.  6. Atherosclerosis of Aorta-  On statin  Length of Stay: 0  Amy Clegg NP-C  08/10/2016, 10:02 AM  Advanced Heart Failure Team Pager [phone] (M-F;  7a - 4p)  Please contact [facility] for night-coverage after hours (4p -7a ) and weekends on [email or web address]  Patient seen with NP, agree with the above note.  Echo reviewed, EF down to 30-35% with diffuse hypokinesis.  1. Acute on chronic systolic CHF: EF 42-68% with diffuse hypokinesis on echo.  She is volume overloaded on exam.  Suspect this corresponds to the onset of atrial fibrillation with RVR. 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  No regional WMAs and no chest pain.  - Lasix 80 mg IV bid.  - Transition from lisinopril to losartan with plan to eventually use Entresto.  - Toprol XL 25 mg bid. - Add spironolactone 12.5 daily.  - Will repeat echo a couple of months after she is back in NSR to see if EF improves.  If not, she will need left heart cath (will need one sooner if any indication of ischemia).  2. Atrial fibrillation:  H/o paroxysmal atrial fibrillation.  Has not had an episode in a while.  Admitted with atrial fibrillation/RVR, probably in  atrial fibrillation for 3-4 days now based on symptoms. She has been on Eliquis without missing doses.  - Start amiodarone gtt to see if she will convert overnight. Given COPD, would avoid long-term amiodarone use.  - If she remains in atrial fibrillation tomorrow, will arrange for DCCV. - Toprol XL 25 mg bid, HR now controlled.  3. COPD: Possible COPD exacerbation, she is on steroids/nebs per primary service.   [name] 08/10/2016 3:08 PM

## 2016-08-11 NOTE — Progress Notes (Signed)
.     Advanced Heart Failure Rounding Note  PCP: Dr. Cheri Rous Primary Cardiologist: Dr. [name]  Subjective:    Didn't pee very much. Still SOB, but feeling somewhat better.  Anxious about DCCV.  Positive 150 cc. Weight stable. Creatinine stable.  Remains in AFib. Rate improved.   Objective:   Weight Range: 259 lb 4.2 oz (117.6 kg) Body mass index is 41.85 kg/m.   Vital Signs:   Temp:  [97.5 F (36.4 C)-97.8 F (36.6 C)] 97.5 F (36.4 C) (01/26 0448) Pulse Rate:  [74-100] 74 (01/26 0448) Resp:  [18-24] 20 (01/26 0448) BP: (137-154)/(78-104) 142/89 (01/26 0448) SpO2:  [94 %-100 %] 98 % (01/26 0448) FiO2 (%):  [4 %] 4 % (01/25 1313) Weight:  [259 lb 4.2 oz (117.6 kg)] 259 lb 4.2 oz (117.6 kg) (01/26 0448) Last BM Date: 08/08/16  Weight change: Filed Weights   08/10/16 0500 08/10/16 0700 08/11/16 0448  Weight: 257 lb 15 oz (117 kg) 259 lb 4.2 oz (117.6 kg) 259 lb 4.2 oz (117.6 kg)    Intake/Output:   Intake/Output Summary (Last 24 hours) at 08/11/16 0914 Last data filed at 08/10/16 0935  Gross per 24 hour  Intake              400 ml  Output                0 ml  Net              400 ml     Physical Exam: General:  Well appearing. NAD.   HEENT: Normal Neck: supple. JVP ~ 12 cm. Carotids 2+ bilat; no bruits. No lymphadenopathy or thryomegaly appreciated. Cor: PMI nondisplaced. Irregular rate & rhythm. No rubs, gallops or murmurs. Lungs: EW throughout on 2 liters [place] oxygen Abdomen: obese, soft, NT, ND, no HSM. No bruits or masses. +BS  Extremities: no cyanosis, clubbing, rash, R and LLE no edema.  Neuro: alert & orientedx3, cranial nerves grossly intact. moves all 4 extremities w/o difficulty. Affect pleasant  Telemetry: Reviewed, Afib 70-80s  Labs: CBC  Recent Labs  08/10/16 0419 08/11/16 0237  WBC 11.9* 14.0*  HGB 14.4 13.9  HCT 44.6 44.4  MCV 89.9 91.5  PLT 271 [id number]   Basic Metabolic Panel  Recent Labs  08/10/16 0419 08/11/16 0237  NA 138 139    K 3.8 4.2  CL 96* 95*  CO2 27 32  GLUCOSE 355* 149*  BUN 15 20  CREATININE 0.95 0.87  CALCIUM 8.6* 8.5*  MG 1.8 2.4   Liver Function Tests  Recent Labs  08/10/16 0026  AST 34  ALT 27  ALKPHOS 44  BILITOT 0.5  PROT 7.5  ALBUMIN 3.8   No results for input(s): LIPASE, AMYLASE in the last 72 hours. Cardiac Enzymes No results for input(s): CKTOTAL, CKMB, CKMBINDEX, TROPONINI in the last 72 hours.  BNP: BNP (last 3 results)  Recent Labs  08/09/16 2149  BNP 315.4*    ProBNP (last 3 results) No results for input(s): PROBNP in the last 8760 hours.   D-Dimer No results for input(s): DDIMER in the last 72 hours. Hemoglobin A1C No results for input(s): HGBA1C in the last 72 hours. Fasting Lipid Panel No results for input(s): CHOL, HDL, LDLCALC, TRIG, CHOLHDL, LDLDIRECT in the last 72 hours. Thyroid Function Tests  Recent Labs  08/10/16 0419  TSH 1.725    Other results:     Imaging/Studies:   No results found.    Medications:  Scheduled Medications: . apixaban  5 mg Oral BID  . budesonide (PULMICORT) nebulizer solution  0.25 mg Nebulization BID  . fenofibrate  160 mg Oral Daily  . furosemide  80 mg Intravenous BID  . glimepiride  4 mg Oral Q breakfast  . insulin aspart  0-20 Units Subcutaneous TID WC  . insulin aspart  5 Units Subcutaneous TID WC  . insulin glargine  15 Units Subcutaneous QHS  . levalbuterol  0.63 mg Nebulization TID  . losartan  50 mg Oral Daily  . metoprolol succinate  25 mg Oral BID  . potassium chloride  10 mEq Oral Daily  . rosuvastatin  10 mg Oral Daily  . sodium chloride flush  3 mL Intravenous Q12H  . spironolactone  12.5 mg Oral Daily     Infusions: . sodium chloride    . amiodarone 30 mg/hr (08/10/16 2338)     PRN Medications:  acetaminophen **OR** acetaminophen, ipratropium-albuterol, levalbuterol, ondansetron **OR** ondansetron (ZOFRAN) IV, oxyCODONE, sodium chloride flush,  tiZANidine   Assessment/Plan   1. Acute on chronic systolic CHF: EF 99991111 with diffuse hypokinesis on echo.  She is volume overloaded on exam.  Suspect this corresponds to the onset of atrial fibrillation with RVR. 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  No regional WMAs and no chest pain.  - Continue Lasix 80 mg IV bid for now. Will give 2.5 mg metolazone and extra K.  - Continue losartan 50 mg daily. Plan to eventually use Entresto.  - Toprol XL 25 mg bid. - Continue spironolactone 12.5 daily.  - Will repeat echo a couple of months after she is back in NSR to see if EF improves.  If not, she will need left heart cath (will need one sooner if any indication of ischemia).  2. Atrial fibrillation:  H/o paroxysmal atrial fibrillation.  Has not had an episode in a while.  Admitted with atrial fibrillation/RVR, probably in atrial fibrillation for 3-4 days now based on symptoms. She has been on Eliquis without missing doses.  - Continue amio gtt for rate control.  She has not converted overnight. Will transition to po soon after DCCV and use lowest effective dose with COPD.  - Will plan to move forward with DCCV this afternoon.   - Toprol XL 25 mg bid, HR now controlled.  3. COPD: Possible COPD exacerbation - Wheezing this am.  Continue diuresis. Steroids/nebs per primary.   Add metolazone. DCCV this afternoon.   Length of Stay: 1  Heidi Stark  08/11/2016, 9:14 AM  Advanced Heart Failure Team Pager [phone] (M-F; 7a - 4p)  Please contact [facility] for night-coverage after hours (4p -7a ) and weekends on [email or web address]  Patient seen with PA, agree with the above note.  Successful DCCV today, back in NSR.  Would continue IV amiodarone overnight then to po tomorrow.  Would titrate down to lowest possible dose given COPD.  Would probably continue at least low dose amiodarone given how symptomatic she was this admission with atrial fibrillation.     Continue diuresis.  She remains quite volume overloaded.  Add metolazone today.  Creatinine remains stable.   Will likely need 24-48 hours more aggressive diuresis.   Heidi Stark 08/11/2016 12:43 PM

## 2016-08-11 NOTE — Interval H&P Note (Signed)
History and Physical Interval Note:  08/11/2016 12:32 PM  Heidi Stark  has presented today for surgery, with the diagnosis of a fib  The various methods of treatment have been discussed with the patient and family. After consideration of risks, benefits and other options for treatment, the patient has consented to  Procedure(s): CARDIOVERSION (N/A) as a surgical intervention .  The patient's history has been reviewed, patient examined, no change in status, stable for surgery.  I have reviewed the patient's chart and labs.  Questions were answered to the patient's satisfaction.     Heidi Stark

## 2016-08-11 NOTE — Transfer of Care (Signed)
Immediate Anesthesia Transfer of Care Note  Patient: Heidi Stark  Procedure(s) Performed: Procedure(s): CARDIOVERSION (N/A)  Patient Location: Endoscopy Unit  Anesthesia Type:MAC  Level of Consciousness: awake, alert  and oriented  Airway & Oxygen Therapy: Patient Spontanous Breathing and Patient connected to nasal cannula oxygen  Post-op Assessment: Report given to RN, Post -op Vital signs reviewed and stable and Patient moving all extremities X 4  Post vital signs: Reviewed and stable  Last Vitals:  Vitals:   08/11/16 0448 08/11/16 1128  BP: (!) 142/89 124/77  Pulse: 74 84  Resp: 20 (!) 22  Temp: 36.4 C 36.7 C    Last Pain:  Vitals:   08/11/16 1128  TempSrc: Oral  PainSc:          Complications: No apparent anesthesia complications

## 2016-08-12 ENCOUNTER — Encounter (HOSPITAL_COMMUNITY): Payer: Self-pay | Admitting: Cardiology

## 2016-08-12 DIAGNOSIS — I482 Chronic atrial fibrillation: Secondary | ICD-10-CM

## 2016-08-12 LAB — CBC
HEMATOCRIT: 41.4 % (ref 36.0–46.0)
Hemoglobin: 13.3 g/dL (ref 12.0–15.0)
MCH: 29 pg (ref 26.0–34.0)
MCHC: 32.1 g/dL (ref 30.0–36.0)
MCV: 90.2 fL (ref 78.0–100.0)
Platelets: 259 10*3/uL (ref 150–400)
RBC: 4.59 MIL/uL (ref 3.87–5.11)
RDW: 15.7 % — AB (ref 11.5–15.5)
WBC: 8.9 10*3/uL (ref 4.0–10.5)

## 2016-08-12 LAB — GLUCOSE, CAPILLARY
Glucose-Capillary: 184 mg/dL — ABNORMAL HIGH (ref 65–99)
Glucose-Capillary: 128 mg/dL — ABNORMAL HIGH (ref 65–99)
Glucose-Capillary: 204 mg/dL — ABNORMAL HIGH (ref 65–99)
Glucose-Capillary: 268 mg/dL — ABNORMAL HIGH (ref 65–99)

## 2016-08-12 LAB — BASIC METABOLIC PANEL
Anion gap: 13 (ref 5–15)
BUN: 26 mg/dL — AB (ref 6–20)
CO2: 32 mmol/L (ref 22–32)
Calcium: 8.4 mg/dL — ABNORMAL LOW (ref 8.9–10.3)
Chloride: 91 mmol/L — ABNORMAL LOW (ref 101–111)
Creatinine, Ser: 0.84 mg/dL (ref 0.44–1.00)
GFR calc Af Amer: >60 mL/min (ref >60)
GLUCOSE: 182 mg/dL — AB (ref 65–99)
POTASSIUM: 3.2 mmol/L — AB (ref 3.5–5.1)
Sodium: 136 mmol/L (ref 135–145)

## 2016-08-12 MED ORDER — AMIODARONE HCL 200 MG PO TABS
200.0000 mg | ORAL_TABLET | Freq: Two times a day (BID) | ORAL | Status: DC
  Administered 2016-08-12 – 2016-08-13 (×3): 200 mg via ORAL
  Filled 2016-08-12 (×3): qty 1

## 2016-08-12 MED ORDER — POTASSIUM CHLORIDE CRYS ER 20 MEQ PO TBCR
40.0000 meq | EXTENDED_RELEASE_TABLET | ORAL | Status: AC
  Administered 2016-08-12 (×2): 40 meq via ORAL
  Filled 2016-08-12 (×2): qty 2

## 2016-08-12 NOTE — Progress Notes (Signed)
PROGRESS NOTE                                                                                                                                                                                                             Patient Demographics:    Heidi Stark, is a 76 y.o. female, DOB - [birth year], OZ:[id number]  Admit date - 08/09/2016   Admitting Physician Rise Patience, MD  Outpatient Primary MD for the patient is Ann Held, DO  LOS - 2  Outpatient Specialists: Dr [name] ( cardiology)  Chief Complaint  Patient presents with  . Shortness of Breath       Brief Narrative   76 year old female with history of COPD (on home O2 at night), paroxysmal A. fib on antibiotics, diabetes mellitus and hypertension presented with shortness of breath for past few days worsened on exertion and having difficulty lying flat. She had again almost 7 pounds from her baseline and was found to be in acute CHF with rapid A. fib. Admitted to stepdown unit requiring BiPAP on admission.   Subjective:   Breathing better, HR improved on the monitor except few PVCs.  Assessment  & Plan :    Principal Problem:   Acute respiratory failure with hypoxia ([place]) Secondary to acute CHF exacerbation.Continue IV Lasix. Added metolazone. Strict I/O. Echo shows worsening EF of 30-35%. Maintaining O2 sat on 2 L via nasal cannula. Heart failure team following. Added low dose Aldactone. Continue ACE inhibitor.   Active Problems:    Atrial fibrillation with rapid ventricular response (HCC) On amiodarone drip. Underwent successful DC cardioversion. Possibly switch to po today.  Acute chronic systolic CHF Worsening EF of 30-35% with diffuse hypokinesis on 2-D echo. Likely contributed by rapid A. fib. Diuresing well on  IV Lasix, added metolazone and Aldactone. Monitor renal function and replace potassium. Continue losartan. Continue Toprol.    Cardiology recommend repeat 2-D echo in 2 months and if not improved she will need left heart catheterization.    COPD (chronic obstructive pulmonary disease) ([place])  with ?mild acute exacerbation. Continue nebulizer.     Type 2 diabetes mellitus with vascular disease (HCC) CBG elevated possibly after receiving steroid in the ED. A1c of 7.7. Now stable on resistance sliding scale coverage and bedtime Lantus.     Obstructive sleep apnea Continue nighttime CPAP.  Essential hypertension Stable. Continue home medications  Hypokalemia replenished      Code Status : Full code  Family Communication  : None at bedside  Disposition Plan  : Home possibly tomorrow if improving  Barriers For Discharge : improving symptoms  Consults  :  Heart failure   Procedures  :  2-D echo  DC cardioversion.  DVT Prophylaxis  : eliquis  Lab Results  Component Value Date   PLT 259 08/12/2016    Antibiotics  :   Anti-infectives    None        Objective:   Vitals:   08/11/16 2112 08/12/16 0430 08/12/16 0500 08/12/16 0853  BP:  (!) 114/48  121/70  Pulse:  (!) 53  63  Resp:  20  18  Temp:  98.2 F (36.8 C)  97.7 F (36.5 C)  TempSrc:  Oral  Oral  SpO2: 98% 97%  93%  Weight:   112 kg (246 lb 14.4 oz)     Wt Readings from Last 3 Encounters:  08/12/16 112 kg (246 lb 14.4 oz)  06/26/16 113 kg (249 lb 1.9 oz)  05/27/16 112.9 kg (249 lb)     Intake/Output Summary (Last 24 hours) at 08/12/16 1103 Last data filed at 08/12/16 0900  Gross per 24 hour  Intake          2351.28 ml  Output             2900 ml  Net          -548.72 ml     Physical Exam  Gen: NAD HEENT: moist mucosa, supple neck Chest: Scattered rhonchi over rt lung, improved CVS:  S1 and S2 irregular, no murmurs rub or gallop  GI: soft, NT, ND,  Musculoskeletal: warm,trace pitting edema bilaterally      Data Review:    CBC  Recent Labs Lab 08/09/16 2149 08/10/16 0419 08/11/16 0237 08/12/16 0300   WBC 15.7* 11.9* 14.0* 8.9  HGB 15.5* 14.4 13.9 13.3  HCT 47.1* 44.6 44.4 41.4  PLT 316 271 288 259  MCV 89.4 89.9 91.5 90.2  MCH 29.4 29.0 28.7 29.0  MCHC 32.9 32.3 31.3 32.1  RDW 16.2* 16.1* 15.9* 15.7*    Chemistries   Recent Labs Lab 08/09/16 2149 08/10/16 0026 08/10/16 0419 08/11/16 0237 08/12/16 0300  NA 135 136 138 139 136  K 6.2* 3.7 3.8 4.2 3.2*  CL 98* 97* 96* 95* 91*  CO2 23 22 27  32 32  GLUCOSE 282* 333* 355* 149* 182*  BUN 17 17 15 20  26*  CREATININE 0.84 0.91 0.95 0.87 0.84  CALCIUM 8.8* 8.7* 8.6* 8.5* 8.4*  MG  --   --  1.8 2.4  --   AST  --  34  --   --   --   ALT  --  27  --   --   --   ALKPHOS  --  44  --   --   --   BILITOT  --  0.5  --   --   --    ------------------------------------------------------------------------------------------------------------------ No results for input(s): CHOL, HDL, LDLCALC, TRIG, CHOLHDL, LDLDIRECT in the last 72 hours.  Lab Results  Component Value Date   HGBA1C 7.7 (H) 10/11/2015   ------------------------------------------------------------------------------------------------------------------  Recent Labs  08/10/16 0419  TSH 1.725   ------------------------------------------------------------------------------------------------------------------ No results for input(s): VITAMINB12, FOLATE, FERRITIN, TIBC, IRON, RETICCTPCT in the last 72 hours.  Coagulation profile No results for input(s): INR, PROTIME in the  last 168 hours.  No results for input(s): DDIMER in the last 72 hours.  Cardiac Enzymes No results for input(s): CKMB, TROPONINI, MYOGLOBIN in the last 168 hours.  Invalid input(s): CK ------------------------------------------------------------------------------------------------------------------    Component Value Date/Time   BNP 315.4 (H) 08/09/2016 2149    Inpatient Medications  Scheduled Meds: . apixaban  5 mg Oral BID  . budesonide (PULMICORT) nebulizer solution  0.25 mg  Nebulization BID  . fenofibrate  160 mg Oral Daily  . furosemide  80 mg Intravenous BID  . glimepiride  4 mg Oral Q breakfast  . insulin aspart  0-20 Units Subcutaneous TID WC  . insulin glargine  15 Units Subcutaneous QHS  . levalbuterol  0.63 mg Nebulization TID  . losartan  50 mg Oral Daily  . metoprolol succinate  25 mg Oral BID  . potassium chloride  10 mEq Oral Daily  . potassium chloride  40 mEq Oral Q4H  . rosuvastatin  10 mg Oral Daily  . sodium chloride flush  3 mL Intravenous Q12H  . spironolactone  12.5 mg Oral Daily   Continuous Infusions: . sodium chloride Stopped (08/11/16 1254)  . amiodarone 30 mg/hr (08/12/16 0242)   PRN Meds:.acetaminophen **OR** acetaminophen, bisacodyl, levalbuterol, ondansetron **OR** ondansetron (ZOFRAN) IV, oxyCODONE, sodium chloride flush, tiZANidine  Micro Results Recent Results (from the past 240 hour(s))  MRSA PCR Screening     Status: None   Collection Time: 08/10/16  3:37 AM  Result Value Ref Range Status   MRSA by PCR NEGATIVE NEGATIVE Final    Comment:        The GeneXpert MRSA Assay (FDA approved for NASAL specimens only), is one component of a comprehensive MRSA colonization surveillance program. It is not intended to diagnose MRSA infection nor to guide or monitor treatment for MRSA infections.     Radiology Reports Dg Chest Port 1 View  Result Date: 08/09/2016 CLINICAL DATA:  76 year old female with shortness of breath. History of asthma and COPD. EXAM: PORTABLE CHEST 1 VIEW COMPARISON:  Chest radiograph dated 10/30/2013 FINDINGS: There is stable cardiomegaly. Increased central vascular and interstitial prominence compatible with congestive changes. There is no focal consolidation, pleural effusion, or pneumothorax. No acute osseous pathology. IMPRESSION: Cardiomegaly with mild CHF. Pneumonia is not excluded. Clinical correlation is recommended. Electronically Signed   By: Anner Crete M.D.   On: 08/09/2016 22:28     Time Spent in minutes  35   Louellen Molder M.D on 08/12/2016 at 11:03 AM  Between 7am to 7pm - Pager - [phone]  After 7pm go to [email or web address] - password [facility]  [facility] -  Office  [phone]

## 2016-08-12 NOTE — Progress Notes (Signed)
.     Advanced Heart Failure Rounding Note  PCP: Dr. Cheri Rous Primary Cardiologist: Dr. [name]  Subjective:    Underwent successful DC-CV yesterday. Remains in NSR.   Remains on IV lasix. Got dose of metolazone yesterday. Good diuresis. Weight down 13 pounds. Feels better.   Creatinine stable. K 3.2  Objective:   Weight Range: 112 kg (246 lb 14.4 oz) Body mass index is 39.85 kg/m.   Vital Signs:   Temp:  [97.5 F (36.4 C)-98.2 F (36.8 C)] 97.7 F (36.5 C) (01/27 0853) Pulse Rate:  [53-84] 63 (01/27 0853) Resp:  [18-23] 18 (01/27 0853) BP: (94-124)/(48-79) 121/70 (01/27 0853) SpO2:  [93 %-98 %] 93 % (01/27 0853) Weight:  [112 kg (246 lb 14.4 oz)] 112 kg (246 lb 14.4 oz) (01/27 0500) Last BM Date: 08/08/16  Weight change: Filed Weights   08/10/16 0700 08/11/16 0448 08/12/16 0500  Weight: 117.6 kg (259 lb 4.2 oz) 117.6 kg (259 lb 4.2 oz) 112 kg (246 lb 14.4 oz)    Intake/Output:   Intake/Output Summary (Last 24 hours) at 08/12/16 1019 Last data filed at 08/12/16 0900  Gross per 24 hour  Intake          2351.28 ml  Output             2500 ml  Net          -148.72 ml     Physical Exam: General:  Sitting in chair NAD.   HEENT: Normal Neck: supple. JVP 5-6 cm. Carotids 2+ bilat; no bruits. No lymphadenopathy or thryomegaly appreciated. Cor: PMI nondisplaced. Regular rate & rhythm. No rubs, gallops or murmurs. Lungs: decreased throughout. No wheezing Abdomen: obese, soft, NT, ND, no HSM. No bruits or masses. +BS  Extremities: no cyanosis, clubbing, rash, R and LLE no edema.  Neuro: alert & orientedx3, cranial nerves grossly intact. moves all 4 extremities w/o difficulty. Affect pleasant  Telemetry: Reviewed, NSR  Labs: CBC  Recent Labs  08/11/16 0237 08/12/16 0300  WBC 14.0* 8.9  HGB 13.9 13.3  HCT 44.4 41.4  MCV 91.5 90.2  PLT 288 [id number]   Basic Metabolic Panel  Recent Labs  08/10/16 0419 08/11/16 0237 08/12/16 0300  NA 138 139 136  K 3.8 4.2  3.2*  CL 96* 95* 91*  CO2 27 32 32  GLUCOSE 355* 149* 182*  BUN 15 20 26*  CREATININE 0.95 0.87 0.84  CALCIUM 8.6* 8.5* 8.4*  MG 1.8 2.4  --    Liver Function Tests  Recent Labs  08/10/16 0026  AST 34  ALT 27  ALKPHOS 44  BILITOT 0.5  PROT 7.5  ALBUMIN 3.8   No results for input(s): LIPASE, AMYLASE in the last 72 hours. Cardiac Enzymes No results for input(s): CKTOTAL, CKMB, CKMBINDEX, TROPONINI in the last 72 hours.  BNP: BNP (last 3 results)  Recent Labs  08/09/16 2149  BNP 315.4*    ProBNP (last 3 results) No results for input(s): PROBNP in the last 8760 hours.   D-Dimer No results for input(s): DDIMER in the last 72 hours. Hemoglobin A1C No results for input(s): HGBA1C in the last 72 hours. Fasting Lipid Panel No results for input(s): CHOL, HDL, LDLCALC, TRIG, CHOLHDL, LDLDIRECT in the last 72 hours. Thyroid Function Tests  Recent Labs  08/10/16 0419  TSH 1.725    Other results:     Imaging/Studies:  No results found.    Medications:     Scheduled Medications: . apixaban  5 mg  Oral BID  . budesonide (PULMICORT) nebulizer solution  0.25 mg Nebulization BID  . fenofibrate  160 mg Oral Daily  . furosemide  80 mg Intravenous BID  . glimepiride  4 mg Oral Q breakfast  . insulin aspart  0-20 Units Subcutaneous TID WC  . insulin glargine  15 Units Subcutaneous QHS  . levalbuterol  0.63 mg Nebulization TID  . losartan  50 mg Oral Daily  . metoprolol succinate  25 mg Oral BID  . potassium chloride  10 mEq Oral Daily  . potassium chloride  40 mEq Oral Q4H  . rosuvastatin  10 mg Oral Daily  . sodium chloride flush  3 mL Intravenous Q12H  . spironolactone  12.5 mg Oral Daily    Infusions: . sodium chloride Stopped (08/11/16 1254)  . amiodarone 30 mg/hr (08/12/16 0242)    PRN Medications: acetaminophen **OR** acetaminophen, bisacodyl, levalbuterol, ondansetron **OR** ondansetron (ZOFRAN) IV, oxyCODONE, sodium chloride flush,  tiZANidine   Assessment/Plan   1. Acute on chronic systolic CHF: EF 99991111 with diffuse hypokinesis on echo.  She is volume overloaded on exam.  Suspect this corresponds to the onset of atrial fibrillation with RVR. 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  No regional WMAs and no chest pain.  - Volume status back to baseline. Switch to po lasix.  - Continue losartan 50 mg daily. Plan to eventually use Entresto.  - Toprol XL 25 mg bid. - Continue spironolactone 12.5 daily.  - Will repeat echo a couple of months after she is back in NSR to see if EF improves.  If not, she will need left heart cath (will need one sooner if any indication of ischemia).  2. Atrial fibrillation:  H/o paroxysmal atrial fibrillation. Admitted with atrial fibrillation/RVR, probably in atrial fibrillation for 3-4 days now based on symptoms. She has been on Eliquis without missing doses.  - S/p DC-CV 1/26. Remains in NSR. Transition to po amio today and use lowest effective dose with COPD.  - Continue Toprol XL 12.5 mg bid,  3. COPD: Possible COPD exacerbation - Respiratory status improved with diuresis. Steroids/nebs per primary.  4. Hypokalemia - Supp K   Can go home tomorrow on   Amio 200 bid Lasix 40am/20pm (increased dose) Apixaban 5 bid Toprol 25 bid (increased dose) Crestor 10 daily Spiro 12.5 daily Losartan 50 daily  (switched from lisinopril)  We will sign off. F/u in HF clinic 1-2 weeks.     Length of Stay: 2  Glori Bickers, MD  08/12/2016, 10:19 AM Advanced Heart Failure Team Pager [phone] (M-F; 7a - 4p)  Please contact [facility] for night-coverage after hours (4p -7a ) and weekends on [email or web address]

## 2016-08-13 DIAGNOSIS — K5900 Constipation, unspecified: Secondary | ICD-10-CM | POA: Diagnosis present

## 2016-08-13 DIAGNOSIS — I10 Essential (primary) hypertension: Secondary | ICD-10-CM

## 2016-08-13 DIAGNOSIS — E876 Hypokalemia: Secondary | ICD-10-CM | POA: Diagnosis present

## 2016-08-13 DIAGNOSIS — I4891 Unspecified atrial fibrillation: Secondary | ICD-10-CM | POA: Diagnosis not present

## 2016-08-13 LAB — BASIC METABOLIC PANEL
Anion gap: 15 (ref 5–15)
BUN: 23 mg/dL — AB (ref 6–20)
CHLORIDE: 88 mmol/L — AB (ref 101–111)
CO2: 34 mmol/L — AB (ref 22–32)
CREATININE: 0.78 mg/dL (ref 0.44–1.00)
Calcium: 8.5 mg/dL — ABNORMAL LOW (ref 8.9–10.3)
GFR calc Af Amer: >60 mL/min (ref >60)
GFR calc non Af Amer: >60 mL/min (ref >60)
Glucose, Bld: 158 mg/dL — ABNORMAL HIGH (ref 65–99)
Potassium: 3.4 mmol/L — ABNORMAL LOW (ref 3.5–5.1)
SODIUM: 137 mmol/L (ref 135–145)

## 2016-08-13 LAB — GLUCOSE, CAPILLARY
GLUCOSE-CAPILLARY: 193 mg/dL — AB (ref 65–99)
Glucose-Capillary: 186 mg/dL — ABNORMAL HIGH (ref 65–99)

## 2016-08-13 MED ORDER — AMIODARONE HCL 200 MG PO TABS: 200.0000 mg | ORAL_TABLET | Freq: Two times a day (BID) | ORAL | 0 refills | Status: DC

## 2016-08-13 MED ORDER — BISACODYL 10 MG RE SUPP
10.0000 mg | Freq: Once | RECTAL | Status: AC
  Administered 2016-08-13: 10 mg via RECTAL
  Filled 2016-08-13: qty 1

## 2016-08-13 MED ORDER — SPIRONOLACTONE 25 MG PO TABS
12.5000 mg | ORAL_TABLET | Freq: Every day | ORAL | 0 refills | Status: DC
Start: 2016-08-13 — End: 2016-08-21

## 2016-08-13 MED ORDER — METOPROLOL SUCCINATE ER 25 MG PO TB24: 25.0000 mg | ORAL_TABLET | Freq: Two times a day (BID) | ORAL | 0 refills | Status: DC

## 2016-08-13 MED ORDER — LEVALBUTEROL HCL 0.63 MG/3ML IN NEBU: 0.6300 mg | INHALATION_SOLUTION | Freq: Two times a day (BID) | RESPIRATORY_TRACT | Status: DC

## 2016-08-13 MED ORDER — POLYETHYLENE GLYCOL 3350 17 G PO PACK: 17.0000 g | PACK | Freq: Every day | ORAL | 0 refills | Status: DC | PRN

## 2016-08-13 MED ORDER — FUROSEMIDE 20 MG PO TABS: 20.0000 mg | ORAL_TABLET | Freq: Two times a day (BID) | ORAL | 1 refills | Status: DC

## 2016-08-13 MED ORDER — POTASSIUM CHLORIDE CRYS ER 20 MEQ PO TBCR
40.0000 meq | EXTENDED_RELEASE_TABLET | Freq: Once | ORAL | Status: AC
  Administered 2016-08-13: 40 meq via ORAL
  Filled 2016-08-13: qty 2

## 2016-08-13 MED ORDER — LOSARTAN POTASSIUM 50 MG PO TABS: 50.0000 mg | ORAL_TABLET | Freq: Every day | ORAL | 0 refills | Status: DC

## 2016-08-13 NOTE — Discharge Instructions (Addendum)
Atrial Fibrillation Atrial fibrillation is a type of irregular or rapid heartbeat (arrhythmia). In atrial fibrillation, the heart quivers continuously in a chaotic pattern. This occurs when parts of the heart receive disorganized signals that make the heart unable to pump blood normally. This can increase the risk for stroke, heart failure, and other heart-related conditions. There are different types of atrial fibrillation, including:  Paroxysmal atrial fibrillation. This type starts suddenly, and it usually stops on its own shortly after it starts.  Persistent atrial fibrillation. This type often lasts longer than a week. It may stop on its own or with treatment.  Long-lasting persistent atrial fibrillation. This type lasts longer than 12 months.  Permanent atrial fibrillation. This type does not go away. Talk with your health care provider to learn about the type of atrial fibrillation that you have. What are the causes? This condition is caused by some heart-related conditions or procedures, including:  A heart attack.  Coronary artery disease.  Heart failure.  Heart valve conditions.  High blood pressure.  Inflammation of the sac that surrounds the heart (pericarditis).  Heart surgery.  Certain heart rhythm disorders, such as Wolf-Parkinson-White syndrome. Other causes include:  Pneumonia.  Obstructive sleep apnea.  Blockage of an artery in the lungs (pulmonary embolism, or PE).  Lung cancer.  Chronic lung disease.  Thyroid problems, especially if the thyroid is overactive (hyperthyroidism).  Caffeine.  Excessive alcohol use or illegal drug use.  Use of some medicines, including certain decongestants and diet pills. Sometimes, the cause cannot be found. What increases the risk? This condition is more likely to develop in:  People who are older in age.  People who smoke.  People who have diabetes mellitus.  People who are overweight (obese).  Athletes  who exercise vigorously. What are the signs or symptoms? Symptoms of this condition include:  A feeling that your heart is beating rapidly or irregularly.  A feeling of discomfort or pain in your chest.  Shortness of breath.  Sudden light-headedness or weakness.  Getting tired easily during exercise. In some cases, there are no symptoms. How is this diagnosed? Your health care provider may be able to detect atrial fibrillation when taking your pulse. If detected, this condition may be diagnosed with:  An electrocardiogram (ECG).  A Holter monitor test that records your heartbeat patterns over a 24-hour period.  Transthoracic echocardiogram (TTE) to evaluate how blood flows through your heart.  Transesophageal echocardiogram (TEE) to view more detailed images of your heart.  A stress test.  Imaging tests, such as a CT scan or chest X-ray.  Blood tests. How is this treated? The main goals of treatment are to prevent blood clots from forming and to keep your heart beating at a normal rate and rhythm. The type of treatment that you receive depends on many factors, such as your underlying medical conditions and how you feel when you are experiencing atrial fibrillation. This condition may be treated with:  Medicine to slow down the heart rate, bring the hearts rhythm back to normal, or prevent clots from forming.  Electrical cardioversion. This is a procedure that resets your hearts rhythm by delivering a controlled, low-energy shock to the heart through your skin.  Different types of ablation, such as catheter ablation, catheter ablation with pacemaker, or surgical ablation. These procedures destroy the heart tissues that send abnormal signals. When the pacemaker is used, it is placed under your skin to help your heart beat in a regular rhythm. Follow these instructions  at home:  Take over-the counter and prescription medicines only as told by your health care provider.  If  your health care provider prescribed a blood-thinning medicine (anticoagulant), take it exactly as told. Taking too much blood-thinning medicine can cause bleeding. If you do not take enough blood-thinning medicine, you will not have the protection that you need against stroke and other problems.  Do not use tobacco products, including cigarettes, chewing tobacco, and e-cigarettes. If you need help quitting, ask your health care provider.  If you have obstructive sleep apnea, manage your condition as told by your health care provider.  Do not drink alcohol.  Do not drink beverages that contain caffeine, such as coffee, soda, and tea.  Maintain a healthy weight. Do not use diet pills unless your health care provider approves. Diet pills may make heart problems worse.  Follow diet instructions as told by your health care provider.  Exercise regularly as told by your health care provider.  Keep all follow-up visits as told by your health care provider. This is important. How is this prevented?  Avoid drinking beverages that contain caffeine or alcohol.  Avoid certain medicines, especially medicines that are used for breathing problems.  Avoid certain herbs and herbal medicines, such as those that contain ephedra or ginseng.  Do not use illegal drugs, such as cocaine and amphetamines.  Do not smoke.  Manage your high blood pressure. Contact a health care provider if:  You notice a change in the rate, rhythm, or strength of your heartbeat.  You are taking an anticoagulant and you notice increased bruising.  You tire more easily when you exercise or exert yourself. Get help right away if:  You have chest pain, abdominal pain, sweating, or weakness.  You feel nauseous.  You notice blood in your vomit, bowel movement, or urine.  You have shortness of breath.  You suddenly have swollen feet and ankles.  You feel dizzy.  You have sudden weakness or numbness of the face, arm,  or leg, especially on one side of the body.  You have trouble speaking, trouble understanding, or both (aphasia).  Your face or your eyelid droops on one side. These symptoms may represent a serious problem that is an emergency. Do not wait to see if the symptoms will go away. Get medical help right away. Call your local emergency services (911 in the [place] Do not drive yourself to the hospital.  This information is not intended to replace advice given to you by your health care provider. Make sure you discuss any questions you have with your health care provider. Document Released: 07/03/2005 Document Revised: 11/10/2015 Document Reviewed: 10/28/2014 Elsevier Interactive Patient Education  2017 [employer]. Heart Failure Heart failure is a condition in which the heart has trouble pumping blood because it has become weak or stiff. This means that the heart does not pump blood efficiently for the body to work well. For some people with heart failure, fluid may back up into the lungs and there may be swelling (edema) in the lower legs. Heart failure is usually a long-term (chronic) condition. It is important for you to take good care of yourself and follow the treatment plan from your health care provider. What are the causes? This condition is caused by some health problems, including:  High blood pressure (hypertension). Hypertension causes the heart muscle to work harder than normal. High blood pressure eventually causes the heart to become stiff and weak.  Coronary artery disease (CAD). CAD is  the buildup of cholesterol and fat (plaques) in the arteries of the heart.  Heart attack (myocardial infarction). Injured tissue, which is caused by the heart attack, does not contract as well and the heart's ability to pump blood is weakened.  Abnormal heart valves. When the heart valves do not open and close properly, the heart muscle must pump harder to keep the blood flowing.  Heart muscle  disease (cardiomyopathy or myocarditis). Heart muscle disease is damage to the heart muscle from a variety of causes, such as drug or alcohol abuse, infections, or unknown causes. These can increase the risk of heart failure.  Lung disease. When the lungs do not work properly, the heart must work harder. What increases the risk? Risk of heart failure increases as a person ages. This condition is also more likely to develop in people who:  Are overweight.  Are female.  Smoke or chew tobacco.  Abuse alcohol or illegal drugs.  Have taken medicines that can damage the heart, such as chemotherapy drugs.  Have diabetes.  High blood sugar (glucose) is associated with high fat (lipid) levels in the blood.  Diabetes can also damage tiny blood vessels that carry nutrients to the heart muscle.  Have abnormal heart rhythms.  Have thyroid problems.  Have low blood counts (anemia). What are the signs or symptoms? Symptoms of this condition include:  Shortness of breath with activity, such as when climbing stairs.  Persistent cough.  Swelling of the feet, ankles, legs, or abdomen.  Unexplained weight gain.  Difficulty breathing when lying flat (orthopnea).  Waking from sleep because of the need to sit up and get more air.  Rapid heartbeat.  Fatigue and loss of energy.  Feeling light-headed, dizzy, or close to fainting.  Loss of appetite.  Nausea.  Increased urination during the night (nocturia).  Confusion. How is this diagnosed? This condition is diagnosed based on:  Medical history, symptoms, and a physical exam.  Diagnostic tests, which may include:  Echocardiogram.  Electrocardiogram (ECG).  Chest X-ray.  Blood tests.  Exercise stress test.  Radionuclide scans.  Cardiac catheterization and angiogram. How is this treated? Treatment for this condition is aimed at managing the symptoms of heart failure. Medicines, behavioral changes, or other treatments may  be necessary to treat heart failure. Medicines  These may include:  Angiotensin-converting enzyme (ACE) inhibitors. This type of medicine blocks the effects of a blood protein called angiotensin-converting enzyme. ACE inhibitors relax (dilate) the blood vessels and help to lower blood pressure.  Angiotensin receptor blockers (ARBs). This type of medicine blocks the actions of a blood protein called angiotensin. ARBs dilate the blood vessels and help to lower blood pressure.  Water pills (diuretics). Diuretics cause the kidneys to remove salt and water from the blood. The extra fluid is removed through urination, leaving a lower volume of blood that the heart has to pump.  Beta blockers. These improve heart muscle strength and they prevent the heart from beating too quickly.  Digoxin. This increases the force of the heartbeat. Healthy behavior changes  These may include:  Reaching and maintaining a healthy weight.  Stopping smoking or chewing tobacco.  Eating heart-healthy foods.  Limiting or avoiding alcohol.  Stopping use of street drugs (illegal drugs).  Physical activity. Other treatments  These may include:  Surgery to open blocked coronary arteries or repair damaged heart valves.  Placement of a biventricular pacemaker to improve heart muscle function (cardiac resynchronization therapy). This device paces both the right ventricle and  left ventricle.  Placement of a device to treat serious abnormal heart rhythms (implantable cardioverter defibrillator, or ICD).  Placement of a device to improve the pumping ability of the heart (left ventricular assist device, or LVAD).  Heart transplant. This can cure heart failure, and it is considered for certain patients who do not improve with other therapies. Follow these instructions at home: Medicines  Take over-the-counter and prescription medicines only as told by your health care provider. Medicines are important in reducing  the workload of your heart, slowing the progression of heart failure, and improving your symptoms.  Do not stop taking your medicine unless your health care provider told you to do that.  Do not skip any dose of medicine.  Refill your prescriptions before you run out of medicine. You need your medicines every day. Eating and drinking  Eat heart-healthy foods. Talk with a dietitian to make an eating plan that is right for you.  Choose foods that contain no trans fat and are low in saturated fat and cholesterol. Healthy choices include fresh or frozen fruits and vegetables, fish, lean meats, legumes, fat-free or low-fat dairy products, and whole-grain or high-fiber foods.  Limit salt (sodium) if directed by your health care provider. Sodium restriction may reduce symptoms of heart failure. Ask a dietitian to recommend heart-healthy seasonings.  Use healthy cooking methods instead of frying. Healthy methods include roasting, grilling, broiling, baking, poaching, steaming, and stir-frying.  Limit your fluid intake if directed by your health care provider. Fluid restriction may reduce symptoms of heart failure. Lifestyle  Stop smoking or using chewing tobacco. Nicotine and tobacco can damage your heart and your blood vessels. Do not use nicotine gum or patches before talking to your health care provider.  Limit alcohol intake to no more than 1 drink per day for non-pregnant women and 2 drinks per day for men. One drink equals 12 oz of beer, 5 oz of wine, or 1 oz of hard liquor.  Drinking more than that is harmful to your heart. Tell your health care provider if you drink alcohol several times a week.  Talk with your health care provider about whether any level of alcohol use is safe for you.  If your heart has already been damaged by alcohol or you have severe heart failure, drinking alcohol should be stopped completely.  Stop use of illegal drugs.  Lose weight if directed by your health  care provider. Weight loss may reduce symptoms of heart failure.  Do moderate physical activity if directed by your health care provider. People who are elderly and people with severe heart failure should consult with a health care provider for physical activity recommendations. Monitor important information  Weigh yourself every day. Keeping track of your weight daily helps you to notice excess fluid sooner.  Weigh yourself every morning after you urinate and before you eat breakfast.  Wear the same amount of clothing each time you weigh yourself.  Record your daily weight. Provide your health care provider with your weight record.  Monitor and record your blood pressure as told by your health care provider.  Check your pulse as told by your health care provider. Dealing with extreme temperatures  If the weather is extremely hot:  Avoid vigorous physical activity.  Use air conditioning or fans or seek a cooler location.  Avoid caffeine and alcohol.  Wear loose-fitting, lightweight, and light-colored clothing.  If the weather is extremely cold:  Avoid vigorous physical activity.  Layer your clothes.  Wear mittens or gloves, a hat, and a scarf when you go outside.  Avoid alcohol. General instructions  Manage other health conditions such as hypertension, diabetes, thyroid disease, or abnormal heart rhythms as told by your health care provider.  Learn to manage stress. If you need help to do this, ask your health care provider.  Plan rest periods when fatigued.  Get ongoing education and support as needed.  Participate in or seek rehabilitation as needed to maintain or improve independence and quality of life.  Stay up to date with immunizations. Keeping current on pneumococcal and influenza immunizations is especially important to prevent respiratory infections.  Keep all follow-up visits as told by your health care provider. This is important. Contact a health care  provider if:  You have a rapid weight gain.  You have increasing shortness of breath that is unusual for you.  You are unable to participate in your usual physical activities.  You tire easily.  You cough more than normal, especially with physical activity.  You have any swelling or more swelling in areas such as your hands, feet, ankles, or abdomen.  You are unable to sleep because it is hard to breathe.  You feel like your heart is beating quickly (palpitations).  You become dizzy or light-headed when you stand up. Get help right away if:  You have difficulty breathing.  You notice or your family notices a change in your awareness, such as having trouble staying awake or having difficulty with concentration.  You have pain or discomfort in your chest.  You have an episode of fainting (syncope). This information is not intended to replace advice given to you by your health care provider. Make sure you discuss any questions you have with your health care provider. Document Released: 07/03/2005 Document Revised: 03/07/2016 Document Reviewed: 01/26/2016 Elsevier Interactive Patient Education  2017 [employer].

## 2016-08-13 NOTE — Care Management Note (Signed)
Case Management Note Marvetta Gibbons RN, BSN Unit 2W-Case Manager [phone]  Patient Details  Name: Heidi Stark MRN: [id number] Date of Birth: Jun 09, 1941  Subjective/Objective:    Pt admitted with acute resp. Failure-   CHF              Action/Plan: PTA pt lived at home- has home 02 with [facility]- baseline 2L, as needed- orders placed for HHRN-HF management- spoke with pt at bedside to offer choice- per pt she has used [facility] in past- call made to [facility] with [facility] for referral- however [facility] can not accept referral for [facility] services- offered choice again to pt- who states she does not have a preference for secondary- ok with using [facility]- referral called to Dian Situ with [facility] who accepted referral-   Expected Discharge Date:  08/13/16               Expected Discharge Plan:  [place]  In-House Referral:     Discharge planning Services  CM Consult  Post Acute Care Choice:  Home Health Choice offered to:  Patient  DME Arranged:  N/A DME Agency:  NA  HH Arranged:  RN, Disease Management [place] Agency:  Other - See comment  Status of Service:  Completed, signed off  If discussed at [place] of Stay Meetings, dates discussed:    Discharge Disposition: home with home health   Additional Comments:  Dawayne Patricia, RN 08/13/2016, 12:36 PM

## 2016-08-13 NOTE — Progress Notes (Signed)
SATURATION QUALIFICATIONS: (This note is used to comply with regulatory documentation for home oxygen)  Patient Saturations on Room Air at Rest = 87  Patient Saturations on Room Air while Ambulating = 82%  Patient Saturations on 2 Liters of oxygen while Ambulating = 91%  Please briefly explain why patient needs home oxygen:

## 2016-08-13 NOTE — Discharge Summary (Signed)
Physician Discharge Summary  Heidi Stark [id number] DOB: 1941/03/20 DOA: 08/09/2016  PCP: Ann Held, DO  Admit date: 08/09/2016 Discharge date: 08/13/2016  Admitted From: Home Disposition:  Home  Recommendations for Outpatient Follow-up:  1. Follow up with PCP in 1-2 weeks 2. Follow-up in heart failure clinic in 2 weeks. 3. Patient will need continuous oxygen via nasal cannula (2 L both at rest and ambulation)  Home Health: RN Equipment/Devices: Oxygen (2 L via nasal cannula continuously)  Discharge Condition: Fair CODE STATUS: Full code Diet recommendation: Heart Healthy / Carb Modified    Discharge Diagnoses:  Principal Problem:   Acute respiratory failure with hypoxia ([place])   Active Problems:   Atrial fibrillation with rapid ventricular response (HCC)   Acute on chronic systolic CHF (congestive heart failure) (HCC)   Atrial fibrillation status post cardioversion (HCC)   Hypokalemia   Type 2 diabetes mellitus with vascular disease (HCC)   HLD (hyperlipidemia)   Obstructive sleep apnea   Essential hypertension   COPD (chronic obstructive pulmonary disease) (HCC)   Type 2 diabetes mellitus with hyperglycemia, with long-term current use of insulin (HCC)   COPD exacerbation (HCC)   Constipation   Brief narrative/history of present illness 76 year old female with history of COPD (on home O2 at night), paroxysmal A. fib on antibiotics, diabetes mellitus and hypertension presented with shortness of breath for past few days worsened on exertion and having difficulty lying flat. She had again almost 7 pounds from her baseline and was found to be in acute CHF with rapid A. fib. Admitted to stepdown unit requiring BiPAP on admission.  Hospital course Principal Problem:   Acute respiratory failure with hypoxia ([place]) Secondary to acute CHF exacerbation. 2-D echo showed worsening EF of 30-35%. Symptoms better with IV Lasix (diuresed almost 3 L). See plan  below.     Active Problems:    Atrial fibrillation with rapid ventricular response (HCC) Quite amiodarone drip and  Underwent successful DC cardioversion. Heart rate stable on the monitor. will be discharged on low-dose oral amiodarone. Continue beta blocker and anticoagulation.   Acute chronic systolic CHF Worsened EF of 30-35% with diffuse hypokinesis on 2-D echo. Likely contributed by rapid A. fib.  Heart failure team consult appreciated.. Increased dose of Toprol, switch lisinopril to losartan and added low-dose Aldactone. Also increased dose of oral Lasix. Continue statin. Cardiology recommend repeat 2-D echo in 2 months and if not improved she will need left heart catheterization.    COPD (chronic obstructive pulmonary disease) ([place])  with ?mild acute exacerbation. Improved with nebulizer. Continue home inhalers.      Type 2 diabetes mellitus with vascular disease (HCC) CBG elevated possibly after receiving steroid in the ED. A1c of 7.7. Currently stable. Resume home dose Lantus.     Obstructive sleep apnea Continue nighttime CPAP.    Essential hypertension Stable. Continue home medications  Hypokalemia Replenished. Continue home supplement.   Constipation Improved with suppository. Will prescribe MiraLAX.    Patient the setting to 80s on both room air and ambulation and improved to >90% on 2 L via nasal cannula. Will need continuous oxygen at home (already has home O2 for as needed use)   Family Communication  : None at bedside  Disposition Plan  : Home    Consults  :  Heart failure   Procedures  :  2-D echo  DC cardioversion.     Discharge Instructions   Allergies as of 08/13/2016      Reactions  Neomycin-bacitracin Zn-polymyx Rash   Sulfonamide Derivatives Other (See Comments)   Stomach cramps   Ciprofloxacin Hives, Itching, Rash      Medication List    STOP taking these medications   lisinopril 20 MG tablet Commonly known  as:  PRINIVIL,ZESTRIL   metoprolol tartrate 25 MG tablet Commonly known as:  LOPRESSOR     TAKE these medications   ADVAIR DISKUS 250-50 MCG/DOSE Aepb Generic drug:  Fluticasone-Salmeterol INHALE 1 PUFF INTO THE LUNGS EVERY 12 (TWELVE) HOURS. AS NEEDED FOR SHORTNESS OF BREATH   albuterol 108 (90 Base) MCG/ACT inhaler Commonly known as:  PROVENTIL HFA;VENTOLIN HFA Inhale 2 puffs into the lungs every 6 (six) hours as needed for wheezing or shortness of breath.   amiodarone 200 MG tablet Commonly known as:  PACERONE Take 1 tablet (200 mg total) by mouth 2 (two) times daily.   apixaban 5 MG Tabs tablet Commonly known as:  ELIQUIS Take 1 tablet (5 mg total) by mouth 2 (two) times daily.   fenofibrate 160 MG tablet TAKE 1 TABLET (160 MG TOTAL) BY MOUTH DAILY.   furosemide 20 MG tablet Commonly known as:  LASIX Take 2 tablet ( 40 mg ) in am and 1 tablet ( 20 mg) in the evening ( total 60 mg daily) What changed:  See the new instructions.   glimepiride 2 MG tablet Commonly known as:  AMARYL Take 1 tablet (2 mg total) by mouth daily with breakfast. What changed:  how much to take  when to take this  additional instructions   insulin glargine 100 UNIT/ML injection Commonly known as:  LANTUS Inject 0.15 mLs (15 Units total) into the skin at bedtime. 10 units qhs x 30 days   KLOR-CON 10 10 MEQ tablet Generic drug:  potassium chloride TAKE 1 TABLET (10 MEQ TOTAL) BY MOUTH 2 (TWO) TIMES DAILY.   losartan 50 MG tablet Commonly known as:  COZAAR Take 1 tablet (50 mg total) by mouth daily.   metFORMIN 1000 MG tablet Commonly known as:  GLUCOPHAGE Take 1 tablet (1,000 mg total) by mouth 2 (two) times daily with a meal.   metoprolol succinate 25 MG 24 hr tablet Commonly known as:  TOPROL-XL Take 1 tablet (25 mg total) by mouth 2 (two) times daily.   polyethylene glycol packet Commonly known as:  MIRALAX Take 17 g by mouth daily as needed for mild constipation.    rosuvastatin 10 MG tablet Commonly known as:  CRESTOR Take 1 tablet (10 mg total) by mouth daily.   spironolactone 25 MG tablet Commonly known as:  ALDACTONE Take 0.5 tablets (12.5 mg total) by mouth daily.   tiZANidine 4 MG tablet Commonly known as:  ZANAFLEX Take 4 mg by mouth 3 (three) times daily as needed for muscle spasms.       Allergies  Allergen Reactions  . Neomycin-Bacitracin Zn-Polymyx Rash  . Sulfonamide Derivatives Other (See Comments)    Stomach cramps  . Ciprofloxacin Hives, Itching and Rash     Procedures/Studies: Dg Chest Port 1 View  Result Date: 08/09/2016 CLINICAL DATA:  76 year old female with shortness of breath. History of asthma and COPD. EXAM: PORTABLE CHEST 1 VIEW COMPARISON:  Chest radiograph dated 10/30/2013 FINDINGS: There is stable cardiomegaly. Increased central vascular and interstitial prominence compatible with congestive changes. There is no focal consolidation, pleural effusion, or pneumothorax. No acute osseous pathology. IMPRESSION: Cardiomegaly with mild CHF. Pneumonia is not excluded. Clinical correlation is recommended. Electronically Signed   By: Laren Everts.D.  On: 08/09/2016 22:28    2-D echo Study Conclusions  - Left ventricle: The cavity size was mildly dilated. There was   moderate concentric hypertrophy. Systolic function was moderately   to severely reduced. The estimated ejection fraction was in the   range of 30% to 35%. Wall motion was normal; there were no   regional wall motion abnormalities. The study was not technically   sufficient to allow evaluation of LV diastolic dysfunction due to   atrial fibrillation. - Aortic valve: Valve area (VTI): 1.8 cm^2. Valve area (Vmax): 1.64   cm^2. Valve area (Vmean): 1.79 cm^2. - Mitral valve: Calcified annulus. Mildly thickened leaflets .   There was mild regurgitation. - Left atrium: The atrium was moderately dilated. - Right ventricle: The cavity size was mildly  dilated. Wall   thickness was normal. - Right atrium: The atrium was normal in size. - Tricuspid valve: There was moderate regurgitation. - Pulmonic valve: There was mild regurgitation. - Pulmonary arteries: Systolic pressure was severely increased. PA   peak pressure: 55 mm Hg (S). - Inferior vena cava: The vessel was dilated. The respirophasic   diameter changes were blunted (< 50%), consistent with elevated   central venous pressure. - Pericardium, extracardiac: There was no pericardial effusion.  Impressions:  - Compared to the prior study from 10/31/2013 LVEF has decreased,   previously 50-55%, now 30-35%.   RVEF is mildly decreased.   There is severe pulmonary hypertension. RVSP 55 mmHg.   Subjective: Informs her breathing much better. Complains of constipation.  Discharge Exam: Vitals:   08/12/16 2244 08/13/16 0516  BP: 134/60 (!) 141/64  Pulse: 62 63  Resp: 18 18  Temp:  97.5 F (36.4 C)   Vitals:   08/12/16 2130 08/12/16 2244 08/13/16 0516 08/13/16 0756  BP:  134/60 (!) 141/64   Pulse:  62 63   Resp:  18 18   Temp:   97.5 F (36.4 C)   TempSrc:   Oral   SpO2: (!) 89% 97% 96% 96%  Weight:   108 kg (238 lb)     Gen: NAD HEENT: moist mucosa, supple neck Chest: clear Breath sounds bilaterally CVS:  S1 and S2 regular, no murmurs rub or gallop  GI: soft, NT, ND,  Musculoskeletal: warm,trace pitting edema bilaterally ( improved)      The results of significant diagnostics from this hospitalization (including imaging, microbiology, ancillary and laboratory) are listed below for reference.     Microbiology: Recent Results (from the past 240 hour(s))  MRSA PCR Screening     Status: None   Collection Time: 08/10/16  3:37 AM  Result Value Ref Range Status   MRSA by PCR NEGATIVE NEGATIVE Final    Comment:        The GeneXpert MRSA Assay (FDA approved for NASAL specimens only), is one component of a comprehensive MRSA colonization surveillance  program. It is not intended to diagnose MRSA infection nor to guide or monitor treatment for MRSA infections.      Labs: BNP (last 3 results)  Recent Labs  08/09/16 2149  BNP 0000000*   Basic Metabolic Panel:  Recent Labs Lab 08/10/16 0026 08/10/16 0419 08/11/16 0237 08/12/16 0300 08/13/16 0503  NA 136 138 139 136 137  K 3.7 3.8 4.2 3.2* 3.4*  CL 97* 96* 95* 91* 88*  CO2 22 27 32 32 34*  GLUCOSE 333* 355* 149* 182* 158*  BUN 17 15 20  26* 23*  CREATININE 0.91 0.95 0.87 0.84 0.78  CALCIUM 8.7* 8.6* 8.5* 8.4* 8.5*  MG  --  1.8 2.4  --   --    Liver Function Tests:  Recent Labs Lab 08/10/16 0026  AST 34  ALT 27  ALKPHOS 44  BILITOT 0.5  PROT 7.5  ALBUMIN 3.8   No results for input(s): LIPASE, AMYLASE in the last 168 hours. No results for input(s): AMMONIA in the last 168 hours. CBC:  Recent Labs Lab 08/09/16 2149 08/10/16 0419 08/11/16 0237 08/12/16 0300  WBC 15.7* 11.9* 14.0* 8.9  HGB 15.5* 14.4 13.9 13.3  HCT 47.1* 44.6 44.4 41.4  MCV 89.4 89.9 91.5 90.2  PLT 316 271 288 259   Cardiac Enzymes: No results for input(s): CKTOTAL, CKMB, CKMBINDEX, TROPONINI in the last 168 hours. BNP: Invalid input(s): POCBNP CBG:  Recent Labs Lab 08/12/16 0615 08/12/16 1116 08/12/16 1616 08/12/16 2249 08/13/16 0718  GLUCAP 184* 128* 204* 268* 193*   D-Dimer No results for input(s): DDIMER in the last 72 hours. Hgb A1c No results for input(s): HGBA1C in the last 72 hours. Lipid Profile No results for input(s): CHOL, HDL, LDLCALC, TRIG, CHOLHDL, LDLDIRECT in the last 72 hours. Thyroid function studies No results for input(s): TSH, T4TOTAL, T3FREE, THYROIDAB in the last 72 hours.  Invalid input(s): FREET3 Anemia work up No results for input(s): VITAMINB12, FOLATE, FERRITIN, TIBC, IRON, RETICCTPCT in the last 72 hours. Urinalysis    Component Value Date/Time   COLORURINE ORANGE (A) 11/23/2014 2250   APPEARANCEUR TURBID (A) 11/23/2014 2250   LABSPEC  1.030 11/23/2014 2250   PHURINE 5.5 11/23/2014 2250   GLUCOSEU 100 (A) 11/23/2014 2250   GLUCOSEU Negative 11/30/2011 0804   HGBUR SMALL (A) 11/23/2014 2250   BILIRUBINUR neg 05/18/2016 0932   KETONESUR NEGATIVE 11/23/2014 2250   PROTEINUR 0.15 05/18/2016 0932   PROTEINUR 100 (A) 11/23/2014 2250   UROBILINOGEN negative 05/18/2016 0932   UROBILINOGEN 1.0 11/23/2014 2250   NITRITE neg 05/18/2016 0932   NITRITE NEGATIVE 11/23/2014 2250   LEUKOCYTESUR small (1+) (A) 05/18/2016 0932   Sepsis Labs Invalid input(s): PROCALCITONIN,  WBC,  LACTICIDVEN Microbiology Recent Results (from the past 240 hour(s))  MRSA PCR Screening     Status: None   Collection Time: 08/10/16  3:37 AM  Result Value Ref Range Status   MRSA by PCR NEGATIVE NEGATIVE Final    Comment:        The GeneXpert MRSA Assay (FDA approved for NASAL specimens only), is one component of a comprehensive MRSA colonization surveillance program. It is not intended to diagnose MRSA infection nor to guide or monitor treatment for MRSA infections.      Time coordinating discharge: Over 30 minutes  SIGNED:   Louellen Molder, MD  [facility] 08/13/2016, 9:17 AM Pager   If 7PM-7AM, please contact night-coverage [email or web address] Password [email or web address]

## 2016-08-14 ENCOUNTER — Telehealth: Payer: Self-pay | Admitting: Behavioral Health

## 2016-08-14 NOTE — Telephone Encounter (Signed)
Patient declined TCM/Hospital Follow-up at this time. She voiced that she will be going to see her heart doctor soon, but if anything changes she will give the office a call back.

## 2016-08-15 ENCOUNTER — Telehealth: Payer: Self-pay | Admitting: Family Medicine

## 2016-08-15 NOTE — Telephone Encounter (Signed)
Home health nurse Minerva Fester asking to see patient 2x a week for 3wks can be reached at [phone] can call for verbal at number given

## 2016-08-16 NOTE — Telephone Encounter (Signed)
Is this ok?

## 2016-08-16 NOTE — Telephone Encounter (Signed)
yes

## 2016-08-16 NOTE — Telephone Encounter (Signed)
Tried to call but no answer and vm is full.

## 2016-08-17 NOTE — Telephone Encounter (Signed)
Tried to call but vm is full

## 2016-08-18 ENCOUNTER — Telehealth: Payer: Self-pay

## 2016-08-18 ENCOUNTER — Telehealth (HOSPITAL_COMMUNITY): Payer: Self-pay

## 2016-08-18 ENCOUNTER — Telehealth (HOSPITAL_COMMUNITY): Payer: Self-pay | Admitting: *Deleted

## 2016-08-18 NOTE — Telephone Encounter (Signed)
error 

## 2016-08-18 NOTE — Telephone Encounter (Signed)
Patient left VM on CHF triage line stating she has been taking a medication since discharge that she is allergic to per Dr. Montine Circle with benadryl, however, stated benadryl is not helping. Unsure which medication she is referring to. Attempted to return call, no answer, VM full so unable to leave message. Will attempt to return call again later today. Will also forward to CHF clinical pharmD Ileene Patrick to see if she can get ahold of patient to discuss medication reaction.  Renee Pain, RN

## 2016-08-18 NOTE — Telephone Encounter (Signed)
Elmyra Ricks called concerned about pt, she states pt has a rash on her arms and legs and is itchy.  She states pt has several new meds while she was in the hospital:  Amio, lasix, losartan, metoprolol and spironolactone.  She feels one of these is the culprit with all being new unsure which.  She states pt called the on-call MD one night this week and was told to take Benadryl which pt has done with no relief.  Discussed w/Dr [name], he recommends pt have OV appt on Mon to further eval and discuss.  Attempted to call pt w/appt with no answer will try back later.

## 2016-08-21 ENCOUNTER — Ambulatory Visit (HOSPITAL_COMMUNITY)
Admission: RE | Admit: 2016-08-21 | Discharge: 2016-08-21 | Disposition: A | Discharge status: Home / Self Care | Payer: Medicare Other | Source: Ambulatory Visit | Attending: Cardiology | Admitting: Cardiology

## 2016-08-21 ENCOUNTER — Telehealth (HOSPITAL_COMMUNITY): Payer: Self-pay | Admitting: Vascular Surgery

## 2016-08-21 ENCOUNTER — Encounter (HOSPITAL_COMMUNITY): Payer: Self-pay

## 2016-08-21 VITALS — BP 138/70 | HR 62 | Wt 248.5 lb

## 2016-08-21 DIAGNOSIS — Z8249 Family history of ischemic heart disease and other diseases of the circulatory system: Secondary | ICD-10-CM | POA: Diagnosis not present

## 2016-08-21 DIAGNOSIS — Z9889 Other specified postprocedural states: Secondary | ICD-10-CM | POA: Insufficient documentation

## 2016-08-21 DIAGNOSIS — Z981 Arthrodesis status: Secondary | ICD-10-CM | POA: Insufficient documentation

## 2016-08-21 DIAGNOSIS — E785 Hyperlipidemia, unspecified: Secondary | ICD-10-CM | POA: Diagnosis not present

## 2016-08-21 DIAGNOSIS — I4891 Unspecified atrial fibrillation: Secondary | ICD-10-CM

## 2016-08-21 DIAGNOSIS — Z820 Family history of epilepsy and other diseases of the nervous system: Secondary | ICD-10-CM | POA: Insufficient documentation

## 2016-08-21 DIAGNOSIS — L270 Generalized skin eruption due to drugs and medicaments taken internally: Secondary | ICD-10-CM | POA: Diagnosis not present

## 2016-08-21 DIAGNOSIS — I110 Hypertensive heart disease with heart failure: Secondary | ICD-10-CM | POA: Insufficient documentation

## 2016-08-21 DIAGNOSIS — E1151 Type 2 diabetes mellitus with diabetic peripheral angiopathy without gangrene: Secondary | ICD-10-CM | POA: Insufficient documentation

## 2016-08-21 DIAGNOSIS — Z7901 Long term (current) use of anticoagulants: Secondary | ICD-10-CM | POA: Insufficient documentation

## 2016-08-21 DIAGNOSIS — J449 Chronic obstructive pulmonary disease, unspecified: Secondary | ICD-10-CM | POA: Diagnosis not present

## 2016-08-21 DIAGNOSIS — I739 Peripheral vascular disease, unspecified: Secondary | ICD-10-CM

## 2016-08-21 DIAGNOSIS — Z832 Family history of diseases of the blood and blood-forming organs and certain disorders involving the immune mechanism: Secondary | ICD-10-CM | POA: Diagnosis not present

## 2016-08-21 DIAGNOSIS — I480 Paroxysmal atrial fibrillation: Secondary | ICD-10-CM | POA: Diagnosis not present

## 2016-08-21 DIAGNOSIS — Z794 Long term (current) use of insulin: Secondary | ICD-10-CM | POA: Diagnosis not present

## 2016-08-21 DIAGNOSIS — I5022 Chronic systolic (congestive) heart failure: Secondary | ICD-10-CM

## 2016-08-21 DIAGNOSIS — Z87891 Personal history of nicotine dependence: Secondary | ICD-10-CM | POA: Insufficient documentation

## 2016-08-21 LAB — CBC
HEMATOCRIT: 48.2 % — AB (ref 36.0–46.0)
HEMOGLOBIN: 15.6 g/dL — AB (ref 12.0–15.0)
MCH: 28.9 pg (ref 26.0–34.0)
MCHC: 32.4 g/dL (ref 30.0–36.0)
MCV: 89.4 fL (ref 78.0–100.0)
Platelets: 238 10*3/uL (ref 150–400)
RBC: 5.39 MIL/uL — AB (ref 3.87–5.11)
RDW: 15.2 % (ref 11.5–15.5)
WBC: 8.5 10*3/uL (ref 4.0–10.5)

## 2016-08-21 LAB — COMPREHENSIVE METABOLIC PANEL
ALBUMIN: 3.6 g/dL (ref 3.5–5.0)
ALT: 30 U/L (ref 14–54)
AST: 36 U/L (ref 15–41)
Alkaline Phosphatase: 52 U/L (ref 38–126)
Anion gap: 13 (ref 5–15)
BUN: 15 mg/dL (ref 6–20)
CO2: 30 mmol/L (ref 22–32)
Calcium: 8.9 mg/dL (ref 8.9–10.3)
Chloride: 94 mmol/L — ABNORMAL LOW (ref 101–111)
Creatinine, Ser: 0.82 mg/dL (ref 0.44–1.00)
GFR calc Af Amer: >60 mL/min (ref >60)
GFR calc non Af Amer: >60 mL/min (ref >60)
GLUCOSE: 163 mg/dL — AB (ref 65–99)
POTASSIUM: 4.4 mmol/L (ref 3.5–5.1)
SODIUM: 137 mmol/L (ref 135–145)
Total Bilirubin: 0.8 mg/dL (ref 0.3–1.2)
Total Protein: 6.7 g/dL (ref 6.5–8.1)

## 2016-08-21 LAB — TSH: TSH: 6.259 u[IU]/mL — ABNORMAL HIGH (ref 0.350–4.500)

## 2016-08-21 MED ORDER — SACUBITRIL-VALSARTAN 24-26 MG PO TABS: 1.0000 | ORAL_TABLET | Freq: Two times a day (BID) | ORAL | 3 refills | Status: DC

## 2016-08-21 MED ORDER — AMIODARONE HCL 200 MG PO TABS: 200.0000 mg | ORAL_TABLET | Freq: Every day | ORAL | 3 refills | Status: DC

## 2016-08-21 NOTE — Telephone Encounter (Signed)
Ep referral to [facility] or [facility] for ablation/ AFIB

## 2016-08-21 NOTE — Patient Instructions (Signed)
Stop Spironolactone  Stop Losartan  Start Entresto 24/26 mg Twice daily   Decrease Amiodarone to 200 mg daily  Labs today  Labs in 1 week  You have been referred to EP to discuss an ablation  Your physician recommends that you schedule a follow-up appointment in: 3 weeks   Cardiac Ablation Cardiac ablation is a procedure to stop some heart tissue from causing problems. The heart has many electrical connections. Sometimes these connections cause the heart to beat very fast or irregularly. Removing some of the problem areas can improve heart rhythm or make it normal. Ablation is done for people who:  Have Wolff-Parkinson-White syndrome.  Have other fast heart rhythms (tachycardia).  Have taken medicines for an abnormal heart rhythm (arrhythmia) and the medicines had:  No success.  Side effects.  May have a type of heartbeat that could cause death. What happens before the procedure?  Follow instructions from your doctor about eating and drinking before the procedure.  Take your medicines as told by your doctor. Take them at regular times with water unless told differently by your doctor.  If you are taking diabetes medicine, ask your doctor how to take it. Ask if there are any special instructions you should follow. Your doctor may change how much insulin you take the day of the procedure. What happens during the procedure?  A special type of X-ray will be used. The X-ray helps your doctor see images of your heart during the procedure.  A small cut (incision) will be made in your neck or groin.  An IV tube will be started before the procedure begins.  You will be given a numbing medicine (anesthetic) or a medicine to help you relax (sedative).  The skin on your neck or groin will be numbed.  A needle will be put into a large vein in your neck or groin.  A thin, flexible tube (catheter) will be put in to reach your heart.  A dye will be put in the tube. The dye will  show up on X-rays. It will help your doctor see the area of the heart that needs treatment.  When the heart tissue that is causing problems is found, the tip of the tube will send an electrical current to it. This will stop it from causing problems.  The tube will be taken out.  Pressure will be put on the area where the tube was. This will keep it from bleeding. A bandage will be placed over the area. What happens after the procedure?  You will be taken to a recovery area. Your blood pressure, heart rate, and breathing will be watched. The area where the tube was will also be watched for bleeding.  You will need to lie still for 4-6 hours. This keeps the area where the tube was from bleeding. This information is not intended to replace advice given to you by your health care provider. Make sure you discuss any questions you have with your health care provider. Document Released: 03/05/2013 Document Revised: 12/09/2015 Document Reviewed: 11/28/2012 Elsevier Interactive Patient Education  2017 [employer].

## 2016-08-21 NOTE — Telephone Encounter (Signed)
appt sch for today

## 2016-08-21 NOTE — Progress Notes (Signed)
Patient ID: Heidi Stark, female   DOB: 05-09-1941, 76 y.o.   MRN: [id number] PCP: Dr. [name] Cardiology: Dr. [name]  76 yo with history of HTN, DM, hyperlipidemia presents for cardiology followup.  She was admitted in 4/15 with splenic and renal infarcts.  She was found to have paroxysmal atrial fibrillation.  In 4/15, she had fever and flank pain.  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  TEE showed prominent aortic plaque.  While she was in the hospital, she was noted to have a brief run of atrial fibrillation and was started on coumadin.    She has left leg pain with ambulation after about 100 feet, LE dopplers 5/15 showed occluded left external iliac artery.  This has been present ever since she had left iliac damage with a prior back surgery.    She was stable until 1/18, when she developed palpitations and exertional dyspnea.  She was admitted to [facility] with atrial fibrillation/RVR and acute systolic CHF.  EF was found to be 30-35%, down from 50-55% in 2015.  She was diuresed and underwent DCCV back to NSR.  She remains in NSR today.  Overall, breathing is much better.  No dyspnea walking on flat ground.  No orthopnea/PND, no chest pain, no lightheadedness.  However, since leaving the hospital, she has had a red rash across her torso. The rash is pruritic.    ECG: NSR, 1st degree AV block, IVCD 122 msec  Labs (4/15): K 4.1, creatinine 0.7, HCT 46.2 Labs (7/15): K 3.9, creatinine 0.8, HCT 50 Labs (12/15): K 5, creatinine 0.7, HDL 33, LDL 57 Labs (5/16): K 4, creatinine 0.84, TGs 256, HDL 29, LDL 42, HCT 41.2 Labs (1/18): K 3.4, creatinine 0.78  PMH: 1.Type II diabetes 2. HTN 3. Hyperlipidemia 4. COPD 5. LHC (2007) reportedly normal.  6. Paroxysmal atrial fibrillation: First noted in 4/15.  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   - Event monitor (4/15) with no atrial fibrillation.  - Admission with  atrial fibrillation/RVR in 1/18.  7. Chronic systolic CHF: ?tachycardia-mediated as first noted in setting of atrial fibrillation with RVR.  - TEE (4/15) with EF 50-55%, mild LVH, grade III-IV plaque in descending thoracic aorta, RV normal, no PFO, peak RV-RA gradient 42 mmHg.   - Echo (1/18) with EF 30-35%, diffuse hypokinesis, moderate LVH, mildly dilated RV, PASP 55 mmHg.  7. H/o diskectomy. 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 Lower extremity arterial dopplers (5/15) with occluded left external iliac artery.   SH: Married, 2 kids, quit smoking in 2015.  FH: Daughter with factor V Leiden deficiency.  Brother with CABG.  Sister with aneurysm.   ROS: All systems reviewed and negative except as per HPI.   Current Outpatient Prescriptions  Medication Sig Dispense Refill  . ADVAIR DISKUS 250-50 MCG/DOSE AEPB INHALE 1 PUFF INTO THE LUNGS EVERY 12 (TWELVE) HOURS. AS NEEDED FOR SHORTNESS OF BREATH 60 each 2  . albuterol (PROVENTIL HFA;VENTOLIN HFA) 108 (90 Base) MCG/ACT inhaler Inhale 2 puffs into the lungs every 6 (six) hours as needed for wheezing or shortness of breath. 1 Inhaler 1  . amiodarone (PACERONE) 200 MG tablet Take 1 tablet (200 mg total) by mouth daily. 30 tablet 3  . apixaban (ELIQUIS) 5 MG TABS tablet Take 1 tablet (5 mg total) by mouth 2 (two) times daily. 180 tablet 3  . fenofibrate 160 MG tablet TAKE  1 TABLET (160 MG TOTAL) BY MOUTH DAILY. 90 tablet 1  . furosemide (LASIX) 20 MG tablet Take 1 tablet (20 mg total) by mouth 2 (two) times daily. Take 2 tablet ( 40 mg ) in am and 1 tablet ( 20 mg) in the evening 90 tablet 1  . glimepiride (AMARYL) 2 MG tablet Take 1 tablet (2 mg total) by mouth daily with breakfast. (Patient taking differently: Take 2-4 mg by mouth See admin instructions. Takes 2 tabs in am and 1 tab in pm)    . insulin glargine (LANTUS) 100 UNIT/ML injection Inject 0.15 mLs (15 Units total) into the skin at bedtime. 10 units qhs x 30 days 10  mL   . KLOR-CON 10 10 MEQ tablet TAKE 1 TABLET (10 MEQ TOTAL) BY MOUTH 2 (TWO) TIMES DAILY. 180 tablet 1  . metFORMIN (GLUCOPHAGE) 1000 MG tablet Take 1 tablet (1,000 mg total) by mouth 2 (two) times daily with a meal.    . metoprolol succinate (TOPROL-XL) 25 MG 24 hr tablet Take 1 tablet (25 mg total) by mouth 2 (two) times daily. 60 tablet 0  . polyethylene glycol (MIRALAX) packet Take 17 g by mouth daily as needed for mild constipation. 14 each 0  . rosuvastatin (CRESTOR) 10 MG tablet Take 1 tablet (10 mg total) by mouth daily.    Marland Kitchen tiZANidine (ZANAFLEX) 4 MG tablet Take 4 mg by mouth 3 (three) times daily as needed for muscle spasms.    . sacubitril-valsartan (ENTRESTO) 24-26 MG Take 1 tablet by mouth 2 (two) times daily. 60 tablet 3   No current facility-administered medications for this encounter.     BP 130/65  Pulse 60  Ht 5\' 9"  (1.753 m)  Wt 111.131 kg (245 lb)  BMI 36.16 kg/m2 General: NAD Neck: No JVD, no thyromegaly or thyroid nodule.  Lungs: Mild rhonchi CV: Nondisplaced PMI.  Heart regular S1/S2, no S3/S4, 1/6 early SEM.  1+ ankle edema.  No carotid bruit.  I am unable to palpate left PT pulse.  Abdomen: Soft, nontender, no hepatosplenomegaly, no distention.  Skin: Maculopapular erythematous rash   Neurologic: Alert and oriented x 3.  Psych: Normal affect. Extremities: No clubbing or cyanosis.  HEENT: Normal.   Assessment/Plan: 1. Atrial fibrillation: Paroxysmal.  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 She was admitted in 1/18 with atrial fibrillation/RVR and CHF.  She was diurese and had DCCV back to NSR.   - Decrease amiodarone to 200 mg daily.  Check CMET and TSH today.  She will need regular eye exams.  Long-term, I would rather not have her on amiodarone, especially with COPD history.  Based on CASTLE-HF data, I think we should consider atrial fibrillation ablation.  This hopefully would allow her to eventually stop amiodarone.  I will  refer her to Dr Rayann Heman or Dr [name].  - Continue Eliquis.  CBC today.   2. Claudication: Left leg claudication with absent left PT pulse.  Suspect this is related to prior damage to the iliac artery on that side, peripheral arterial dopplers in 5/15 confirmed this.  3. Chronic systolic CHF: EF 99991111 on echo in 1/18, down from 50-55% in 4/15.  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  If so, should improve in NSR.  NYHA class II symptoms, she is not volume overloaded on exam.  - Continue Toprol XL 25 mg bid.  - Stop losartan, start Entresto 24/26 bid. BMET today and again in 10 days.  - Continue Lasix 40 qam/20  qpm.  - Hold spironolactone for now, it is one of 2 new meds that may be causing her rash.  - Repeat echo in 2 months.  If EF is still down, would plan right and left heart cath.  If improved, probably was tachy-mediated CMP.   4. Rash: Looks like drug rash.  Only meds she has not taken before are amiodarone and spironolactone.  As above, holding spironolactone for now to see if this helps rash.  If this does not, will probably have to consider stopping amiodarone (decreased the dose today).   Followup in 3 wks.   [name] 08/21/2016

## 2016-08-23 ENCOUNTER — Telehealth: Payer: Self-pay | Admitting: Family Medicine

## 2016-08-23 NOTE — Telephone Encounter (Signed)
[facility] [phone]   Pt has returned to work, no longer homebound. She is fully aware of her teaching of her heart and care. She said that they are going to discharge her. They just need PCP okay to do so.

## 2016-08-23 NOTE — Telephone Encounter (Signed)
Spoke to Collyer from [facility], gave verbal orders to discharge pt, she is no longer home bound and pt received education of heart care. Melissa state she will send discharge orders via fax later today. Melissa from [facility] had no further questions or concerns at this time. LB

## 2016-08-23 NOTE — Telephone Encounter (Signed)
vm box is full.  Tried to call 3 times.

## 2016-08-28 ENCOUNTER — Ambulatory Visit (INDEPENDENT_AMBULATORY_CARE_PROVIDER_SITE_OTHER): Payer: Medicare Other | Admitting: Cardiology

## 2016-08-28 ENCOUNTER — Encounter: Payer: Self-pay | Admitting: Cardiology

## 2016-08-28 VITALS — BP 122/82 | HR 68 | Ht 66.0 in | Wt 251.0 lb

## 2016-08-28 DIAGNOSIS — I481 Persistent atrial fibrillation: Secondary | ICD-10-CM

## 2016-08-28 DIAGNOSIS — I4819 Other persistent atrial fibrillation: Secondary | ICD-10-CM

## 2016-08-28 NOTE — Progress Notes (Signed)
Electrophysiology Office Note   Date:  08/28/2016   ID:  Heidi Stark, Heidi Stark 01/30/1941, MRN [id number]  PCP:  Ann Held, DO  Cardiologist:  [name] Primary Electrophysiologist:  Garik Diamant [name], MD    Chief Complaint  Patient presents with  . New Patient (Initial Visit)    PAF/post cardioversion     History of Present Illness: Heidi Stark is a 76 y.o. female who presents today for electrophysiology evaluation.   History of hypertension, diabetes, COPD, and hyperlipidemia. She was admitted on 4/15 with splenic and renal infarcts and found to have paroxysmal atrial fibrillation. TEE at the time showed prominent aortic plaques. She was started on Coumadin at that time. One month later, she was found to have an external iliac artery occlusion. She did well until 1/18 when she developed palpitations and exertional dyspnea. She was admitted to [facility] with A. fib and RVR and acute systolic heart failure. Her EF was found to be 30-35%, down from 50-55% in 2015. He was diuresed and underwent cardioversion back to sinus rhythm.   Today, she denies symptoms of palpitations, chest pain, shortness of breath, orthopnea, PND, lower extremity edema, claudication, dizziness, presyncope, syncope, bleeding, or neurologic sequela. The patient is tolerating medications without difficulties and is otherwise without complaint today.    Past Medical History:  Diagnosis Date  . COPD (chronic obstructive pulmonary disease) ([place])   . Diabetes mellitus    Type II  . DVT (deep venous thrombosis) ([place])   . Hyperlipidemia   . Hypertension   . PAF (paroxysmal atrial fibrillation) ([place])    Past Surgical History:  Procedure Laterality Date  . BACK SURGERY     vein graft and colon repair after nicked artery with back surgert  . CARDIOVERSION N/A 08/11/2016   Procedure: CARDIOVERSION;  Surgeon: Larey Dresser, MD;  Location: [facility];  Service: Cardiovascular;  Laterality: N/A;  .  COLONOSCOPY    . CYSTECTOMY     between bladder and kidneys  . GANGLION CYST EXCISION    . KNEE SURGERY     Right Knee cart.Removal  . TEE WITHOUT CARDIOVERSION N/A 10/31/2013   Procedure: TRANSESOPHAGEAL ECHOCARDIOGRAM (TEE);  Surgeon: Larey Dresser, MD;  Location: [facility];  Service: Cardiovascular;  Laterality: N/A;  . TUBAL LIGATION       Current Outpatient Prescriptions  Medication Sig Dispense Refill  . ADVAIR DISKUS 250-50 MCG/DOSE AEPB INHALE 1 PUFF INTO THE LUNGS EVERY 12 (TWELVE) HOURS. AS NEEDED FOR SHORTNESS OF BREATH 60 each 2  . albuterol (PROVENTIL HFA;VENTOLIN HFA) 108 (90 Base) MCG/ACT inhaler Inhale 2 puffs into the lungs every 6 (six) hours as needed for wheezing or shortness of breath. 1 Inhaler 1  . amiodarone (PACERONE) 200 MG tablet Take 1 tablet (200 mg total) by mouth daily. 30 tablet 3  . apixaban (ELIQUIS) 5 MG TABS tablet Take 1 tablet (5 mg total) by mouth 2 (two) times daily. 180 tablet 3  . fenofibrate 160 MG tablet TAKE 1 TABLET (160 MG TOTAL) BY MOUTH DAILY. 90 tablet 1  . furosemide (LASIX) 20 MG tablet Take 1 tablet (20 mg total) by mouth 2 (two) times daily. Take 2 tablet ( 40 mg ) in am and 1 tablet ( 20 mg) in the evening 90 tablet 1  . glimepiride (AMARYL) 2 MG tablet Take 1 tablet (2 mg total) by mouth daily with breakfast. (Patient taking differently: Take 2-4 mg by mouth See admin instructions. Takes 2  tabs in am and 1 tab in pm)    . insulin glargine (LANTUS) 100 UNIT/ML injection Inject 0.15 mLs (15 Units total) into the skin at bedtime. 10 units qhs x 30 days 10 mL   . KLOR-CON 10 10 MEQ tablet TAKE 1 TABLET (10 MEQ TOTAL) BY MOUTH 2 (TWO) TIMES DAILY. 180 tablet 1  . metFORMIN (GLUCOPHAGE) 1000 MG tablet Take 1 tablet (1,000 mg total) by mouth 2 (two) times daily with a meal.    . metoprolol succinate (TOPROL-XL) 25 MG 24 hr tablet Take 1 tablet (25 mg total) by mouth 2 (two) times daily. 60 tablet 0  . polyethylene glycol (MIRALAX) packet  Take 17 g by mouth daily as needed for mild constipation. 14 each 0  . rosuvastatin (CRESTOR) 10 MG tablet Take 1 tablet (10 mg total) by mouth daily.    . sacubitril-valsartan (ENTRESTO) 24-26 MG Take 1 tablet by mouth 2 (two) times daily. 60 tablet 3  . tiZANidine (ZANAFLEX) 4 MG tablet Take 4 mg by mouth 3 (three) times daily as needed for muscle spasms.     No current facility-administered medications for this visit.     Allergies:   Neomycin-bacitracin zn-polymyx; Sulfonamide derivatives; Ciprofloxacin; and Spironolactone   Social History:  The patient  reports that she has been smoking Cigarettes.  She has been smoking about 0.50 packs per day. She has never used smokeless tobacco. She reports that she does not drink alcohol or use drugs.   Family History:  The patient's family history includes Factor V Leiden deficiency in her daughter.    ROS:  Please see the history of present illness.   Otherwise, review of systems is positive for leg swelling.   All other systems are reviewed and negative.    PHYSICAL EXAM: VS:  BP 122/82   Pulse 68   Ht 5\' 6"  (1.676 m)   Wt 251 lb (113.9 kg)   BMI 40.51 kg/m  , BMI Body mass index is 40.51 kg/m. GEN: Well nourished, well developed, in no acute distress  HEENT: normal  Neck: no JVD, carotid bruits, or masses Cardiac: RRR; no murmurs, rubs, or gallops,no edema  Respiratory:  clear to auscultation bilaterally, normal work of breathing GI: soft, nontender, nondistended, + BS MS: no deformity or atrophy  Skin: warm and dry Neuro:  Strength and sensation are intact Psych: euthymic mood, full affect  EKG:  EKG is not ordered today. Personal review of the ekg ordered 08/21/16 shows sinus rhythm, rate 59, nonspecific IVCD  Recent Labs: 08/09/2016: B Natriuretic Peptide 315.4 08/11/2016: Magnesium 2.4 08/21/2016: ALT 30; BUN 15; Creatinine, Ser 0.82; Hemoglobin 15.6; Platelets 238; Potassium 4.4; Sodium 137; TSH 6.259    Lipid Panel       Component Value Date/Time   CHOL 112 10/11/2015 1012   TRIG 187.0 (H) 10/11/2015 1012   HDL 28.90 (L) 10/11/2015 1012   CHOLHDL 4 10/11/2015 1012   VLDL 37.4 10/11/2015 1012   LDLCALC 46 10/11/2015 1012   LDLDIRECT 42.0 11/30/2014 1226     Wt Readings from Last 3 Encounters:  08/28/16 251 lb (113.9 kg)  08/21/16 248 lb 8 oz (112.7 kg)  08/13/16 238 lb (108 kg)    Other studies Reviewed: Additional studies/ records that were reviewed today include: TTE 08/10/16   Review of the above records today demonstrates:  - Left ventricle: The cavity size was mildly dilated. There was   moderate concentric hypertrophy. Systolic function was moderately   to severely  reduced. The estimated ejection fraction was in the   range of 30% to 35%. Wall motion was normal; there were no   regional wall motion abnormalities. The study was not technically   sufficient to allow evaluation of LV diastolic dysfunction due to   atrial fibrillation. - Aortic valve: Valve area (VTI): 1.8 cm^2. Valve area (Vmax): 1.64   cm^2. Valve area (Vmean): 1.79 cm^2. - Mitral valve: Calcified annulus. Mildly thickened leaflets .   There was mild regurgitation. - Left atrium: The atrium was moderately dilated. - Right ventricle: The cavity size was mildly dilated. Wall   thickness was normal. - Right atrium: The atrium was normal in size. - Tricuspid valve: There was moderate regurgitation. - Pulmonic valve: There was mild regurgitation. - Pulmonary arteries: Systolic pressure was severely increased. PA   peak pressure: 55 mm Hg (S). - Inferior vena cava: The vessel was dilated. The respirophasic   diameter changes were blunted (< 50%), consistent with elevated   central venous pressure. - Pericardium, extracardiac: There was no pericardial effusion.   ASSESSMENT AND PLAN:  1.  Paroxysmal atrial fibrillation: Currently on amiodarone and Eliquis for anticoagulation. I have discussed with her continued local  management with amiodarone or with decrease in versus ablation. Risks and benefits of ablation were discussed. Risks include bleeding, tamponade, heart block, stroke, and damage to surrounding organs. She understands these risks and has agreed to ablation. We Daleah Coulson give her information about the procedure today, and if she changes her mind she Gareth Fitzner call us back.  2. Chronic systolic heart failure: EF down to 30-35% from 50-55% in 2015. 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 She is currently on Toprol-XL and Entresto. We'll continue current medications.  3. Claudication: Absent left PT pulse previously. Likely related to prior iliac artery damage on that side.  4. Obstructive sleep apnea: Compliant with CPAP  Current medicines are reviewed at length with the patient today.   The patient does not have concerns regarding her medicines.  The following changes were made today:  none  Labs/ tests ordered today include:  No orders of the defined types were placed in this encounter.    Disposition:   FU with Karey Suthers 2 months  Signed, Channelle Bottger [name], MD  08/28/2016 2:15 PM     [address] [place] [place] [id number] [phone] (office) [phone] (fax)

## 2016-08-28 NOTE — Patient Instructions (Signed)
Medication Instructions:    Your physician recommends that you continue on your current medications as directed. Please refer to the Current Medication list given to you today.  --- If you need a refill on your cardiac medications before your next appointment, please call your pharmacy. ---  Labwork:  None ordered  Testing/Procedures: Your physician has recommended that you have an ablation. Catheter ablation is a medical procedure used to treat some cardiac arrhythmias (irregular heartbeats). During catheter ablation, a long, thin, flexible tube is put into a blood vessel in your groin (upper thigh), or neck. This tube is called an ablation catheter. It is then guided to your heart through the blood vessel. Radio frequency waves destroy small areas of heart tissue where abnormal heartbeats may cause an arrhythmia to start. Please see the instruction sheet given to you today.  Follow-Up:  To be determined once ablation procedure is scheduled.  Thank you for choosing [facility]!!   Trinidad Curet, RN [phone]  Any Other Special Instructions Will Be Listed Below (If Applicable).  Cardiac Ablation Cardiac ablation is a procedure to disable a small amount of heart tissue in very specific places. The heart has many electrical connections. Sometimes these connections are abnormal and can cause the heart to beat very fast or irregularly. By disabling some of the problem areas, heart rhythm can be improved or made normal. Ablation is done for people who:   Have Wolff-Parkinson-White syndrome.   Have other fast heart rhythms (tachycardia).   Have taken medicines for an abnormal heart rhythm (arrhythmia) that resulted in:   No success.   Side effects.   May have a high-risk heartbeat that could result in death.  LET [facility] CARE PROVIDER KNOW ABOUT:   Any allergies you have or any previous reactions you have had to X-ray dye, food (such as seafood), medicine, or tape.    All medicines you are taking, including vitamins, herbs, eye drops, creams, and over-the-counter medicines.   Previous problems you or members of your family have had with the use of anesthetics.   Any blood disorders you have.   Previous surgeries or procedures (such as a kidney transplant) you have had.   Medical conditions you have (such as kidney failure).  RISKS AND COMPLICATIONS Generally, cardiac ablation is a safe procedure. However, problems can occur and include:   Increased risk of cancer. Depending on how long it takes to do the ablation, the dose of radiation can be high.  Bruising and bleeding where a thin, flexible tube (catheter) was inserted during the procedure.   Bleeding into the chest, especially into the sac that surrounds the heart (serious).  Need for a permanent pacemaker if the normal electrical system is damaged.   The procedure may not be fully effective, and this may not be recognized for months. Repeat ablation procedures are sometimes required. BEFORE THE PROCEDURE   Follow any instructions from your health care provider regarding eating and drinking before the procedure.   Take your medicines as directed at regular times with water, unless instructed otherwise by your health care provider. If you are taking diabetes medicine, including insulin, ask how you are to take it and if there are any special instructions you should follow. It is common to adjust insulin dosing the day of the ablation.  PROCEDURE  An ablation is usually performed in a catheterization laboratory with the guidance of fluoroscopy. Fluoroscopy is a type of X-ray that helps your health care provider see images  of your heart during the procedure.   An ablation is a minimally invasive procedure. This means a small cut (incision) is made in either your neck or groin. Your health care provider will decide where to make the incision based on your medical history and physical  exam.  An IV tube will be started before the procedure begins. You will be given an anesthetic or medicine to help you relax (sedative).  The skin on your neck or groin will be numbed. A needle will be inserted into a large vein in your neck or groin and catheters will be threaded to your heart.  A special dye that shows up on fluoroscopy pictures may be injected through the catheter. The dye helps your health care provider see the area of the heart that needs treatment.  The catheter has electrodes on the tip. When the area of heart tissue that is causing the arrhythmia is found, the catheter tip will send an electrical current to the area and "scar" the tissue. Three types of energy can be used to ablate the heart tissue:   Heat (radiofrequency energy).   Laser energy.   Extreme cold (cryoablation).   When the area of the heart has been ablated, the catheter will be taken out. Pressure will be held on the insertion site. This will help the insertion site clot and keep it from bleeding. A bandage will be placed on the insertion site.  AFTER THE PROCEDURE   After the procedure, you will be taken to a recovery area where your vital signs (blood pressure, heart rate, and breathing) will be monitored. The insertion site will also be monitored for bleeding.   You will need to lie still for 4-6 hours. This is to ensure you do not bleed from the catheter insertion site.  This information is not intended to replace advice given to you by your health care provider. Make sure you discuss any questions you have with your health care provider. Document Released: 11/19/2008 Document Revised: 07/24/2014 Document Reviewed: 11/25/2012 Elsevier Interactive Patient Education  2017 [employer].

## 2016-08-31 ENCOUNTER — Ambulatory Visit (HOSPITAL_COMMUNITY)
Admission: RE | Admit: 2016-08-31 | Discharge: 2016-08-31 | Disposition: A | Discharge status: Home / Self Care | Payer: Medicare Other | Source: Ambulatory Visit | Attending: Internal Medicine | Admitting: Internal Medicine

## 2016-08-31 DIAGNOSIS — R7989 Other specified abnormal findings of blood chemistry: Secondary | ICD-10-CM

## 2016-08-31 DIAGNOSIS — I4891 Unspecified atrial fibrillation: Secondary | ICD-10-CM | POA: Insufficient documentation

## 2016-08-31 DIAGNOSIS — R946 Abnormal results of thyroid function studies: Secondary | ICD-10-CM | POA: Insufficient documentation

## 2016-08-31 LAB — BASIC METABOLIC PANEL
Anion gap: 11 (ref 5–15)
BUN: 11 mg/dL (ref 6–20)
CALCIUM: 8.9 mg/dL (ref 8.9–10.3)
CHLORIDE: 97 mmol/L — AB (ref 101–111)
CO2: 30 mmol/L (ref 22–32)
CREATININE: 0.8 mg/dL (ref 0.44–1.00)
GFR calc Af Amer: >60 mL/min (ref >60)
GFR calc non Af Amer: >60 mL/min (ref >60)
Glucose, Bld: 285 mg/dL — ABNORMAL HIGH (ref 65–99)
Potassium: 4.4 mmol/L (ref 3.5–5.1)
SODIUM: 138 mmol/L (ref 135–145)

## 2016-08-31 LAB — T4, FREE: Free T4: 0.98 ng/dL (ref 0.61–1.12)

## 2016-08-31 LAB — TSH: TSH: 5.862 u[IU]/mL — ABNORMAL HIGH (ref 0.350–4.500)

## 2016-08-31 NOTE — Addendum Note (Signed)
Encounter addended by: Harvie Junior, CMA on: 08/31/2016  1:25 PM<BR>    Actions taken: Visit diagnoses modified, Order list changed, Diagnosis association updated

## 2016-09-01 LAB — T3, FREE: T3, Free: 2.8 pg/mL (ref 2.0–4.4)

## 2016-09-04 ENCOUNTER — Inpatient Hospital Stay (HOSPITAL_COMMUNITY): Payer: Medicare Other

## 2016-09-11 ENCOUNTER — Other Ambulatory Visit: Payer: Self-pay | Admitting: Family Medicine

## 2016-09-18 ENCOUNTER — Ambulatory Visit (HOSPITAL_COMMUNITY)
Admission: RE | Admit: 2016-09-18 | Discharge: 2016-09-18 | Disposition: A | Discharge status: Home / Self Care | Payer: Medicare Other | Source: Ambulatory Visit | Attending: Cardiology | Admitting: Cardiology

## 2016-09-18 VITALS — BP 130/68 | HR 78 | Wt 252.0 lb

## 2016-09-18 DIAGNOSIS — E1151 Type 2 diabetes mellitus with diabetic peripheral angiopathy without gangrene: Secondary | ICD-10-CM | POA: Diagnosis not present

## 2016-09-18 DIAGNOSIS — E785 Hyperlipidemia, unspecified: Secondary | ICD-10-CM | POA: Insufficient documentation

## 2016-09-18 DIAGNOSIS — I110 Hypertensive heart disease with heart failure: Secondary | ICD-10-CM | POA: Diagnosis not present

## 2016-09-18 DIAGNOSIS — Z794 Long term (current) use of insulin: Secondary | ICD-10-CM | POA: Insufficient documentation

## 2016-09-18 DIAGNOSIS — Z87891 Personal history of nicotine dependence: Secondary | ICD-10-CM | POA: Diagnosis not present

## 2016-09-18 DIAGNOSIS — Z79899 Other long term (current) drug therapy: Secondary | ICD-10-CM | POA: Diagnosis not present

## 2016-09-18 DIAGNOSIS — I4891 Unspecified atrial fibrillation: Secondary | ICD-10-CM | POA: Diagnosis not present

## 2016-09-18 DIAGNOSIS — I480 Paroxysmal atrial fibrillation: Secondary | ICD-10-CM | POA: Insufficient documentation

## 2016-09-18 DIAGNOSIS — Z7901 Long term (current) use of anticoagulants: Secondary | ICD-10-CM | POA: Diagnosis not present

## 2016-09-18 DIAGNOSIS — I5022 Chronic systolic (congestive) heart failure: Secondary | ICD-10-CM | POA: Insufficient documentation

## 2016-09-18 MED ORDER — EPLERENONE 25 MG PO TABS: 25.0000 mg | ORAL_TABLET | Freq: Every day | ORAL | 3 refills | Status: DC

## 2016-09-18 MED ORDER — METOPROLOL SUCCINATE ER 25 MG PO TB24: 25.0000 mg | ORAL_TABLET | Freq: Two times a day (BID) | ORAL | 6 refills | Status: DC

## 2016-09-18 NOTE — Patient Instructions (Signed)
Start Eplerenone 25 mg daily, if you have an issue with the cost of this medication please give [name] a call at [phone] opt 7 for Doroteo Bradford, our pharmacist  Labs in 10 days  Your physician recommends that you schedule a follow-up appointment in: 6 weeks with echocardiogram

## 2016-09-18 NOTE — Progress Notes (Signed)
Patient ID: Heidi Stark, female   DOB: 05/19/1941, 76 y.o.   MRN: [id number] PCP: Dr. [name] Cardiology: Dr. [name]  76 yo with history of HTN, DM, hyperlipidemia presents for cardiology followup.  She was admitted in 4/15 with splenic and renal infarcts.  She was found to have paroxysmal atrial fibrillation.  In 4/15, she had fever and flank pain.  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  TEE showed prominent aortic plaque.  While she was in the hospital, she was noted to have a brief run of atrial fibrillation and was started on coumadin.    She has left leg pain with ambulation after about 100 feet, LE dopplers 5/15 showed occluded left external iliac artery.  This has been present ever since she had left iliac damage with a prior back surgery.    She was stable until 1/18, when she developed palpitations and exertional dyspnea.  She was admitted to [facility] with atrial fibrillation/RVR and acute systolic CHF.  EF was found to be 30-35%, down from 50-55% in 2015.  She was diuresed and underwent DCCV back to NSR.  She remains in NSR today.    Overall, breathing is much better.  No dyspnea walking on flat ground.  No orthopnea/PND, no chest pain, no lightheadedness.  Spironolactone was stopped as there was concern that it caused a drug rash.  Rash resolved when spironolactone was stopped.  She felt palpitations concerning for atrial fibrillation for a few hours one day, otherwise, no palpitations.    ECG (personally reviewed): NSR, poor RWP  Labs (4/15): K 4.1, creatinine 0.7, HCT 46.2 Labs (7/15): K 3.9, creatinine 0.8, HCT 50 Labs (12/15): K 5, creatinine 0.7, HDL 33, LDL 57 Labs (5/16): K 4, creatinine 0.84, TGs 256, HDL 29, LDL 42, HCT 41.2 Labs (1/18): K 3.4, creatinine 0.78 Labs (2/18): K 4.4, creatinine 0.8, free T3/T4 normal, mildly elevated TSH, LFTs normal, hgb 15.6  PMH: 1.Type II diabetes 2. HTN 3. Hyperlipidemia 4. COPD 5. LHC (2007)  reportedly normal.  6. Paroxysmal atrial fibrillation: First noted in 4/15.  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   - Event monitor (4/15) with no atrial fibrillation.  - Admission with atrial fibrillation/RVR in 1/18.  7. Chronic systolic CHF: ?tachycardia-mediated as first noted in setting of atrial fibrillation with RVR.  - TEE (4/15) with EF 50-55%, mild LVH, grade III-IV plaque in descending thoracic aorta, RV normal, no PFO, peak RV-RA gradient 42 mmHg.   - Echo (1/18) with EF 30-35%, diffuse hypokinesis, moderate LVH, mildly dilated RV, PASP 55 mmHg.  7. H/o diskectomy. 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 Lower extremity arterial dopplers (5/15) with occluded left external iliac artery.   SH: Married, 2 kids, quit smoking in 2015.  FH: Daughter with factor V Leiden deficiency.  Brother with CABG.  Sister with aneurysm.   ROS: All systems reviewed and negative except as per HPI.   Current Outpatient Prescriptions  Medication Sig Dispense Refill  . ADVAIR DISKUS 250-50 MCG/DOSE AEPB INHALE 1 PUFF INTO THE LUNGS EVERY 12 (TWELVE) HOURS. AS NEEDED FOR SHORTNESS OF BREATH 60 each 2  . albuterol (PROVENTIL HFA;VENTOLIN HFA) 108 (90 Base) MCG/ACT inhaler Inhale 2 puffs into the lungs every 6 (six) hours as needed for wheezing or shortness of breath. 1 Inhaler 1  . amiodarone (PACERONE) 200 MG tablet Take 1 tablet (200 mg total) by mouth  daily. 30 tablet 3  . apixaban (ELIQUIS) 5 MG TABS tablet Take 1 tablet (5 mg total) by mouth 2 (two) times daily. 180 tablet 3  . fenofibrate 160 MG tablet TAKE 1 TABLET (160 MG TOTAL) BY MOUTH DAILY. 90 tablet 1  . furosemide (LASIX) 20 MG tablet Take 1 tablet (20 mg total) by mouth 2 (two) times daily. Take 2 tablet ( 40 mg ) in am and 1 tablet ( 20 mg) in the evening 90 tablet 1  . glimepiride (AMARYL) 2 MG tablet Take 1 tablet (2 mg total) by mouth daily with breakfast. (Patient taking  differently: Take 2-4 mg by mouth See admin instructions. Takes 2 tabs in am and 1 tab in pm)    . insulin glargine (LANTUS) 100 UNIT/ML injection Inject 0.15 mLs (15 Units total) into the skin at bedtime. 10 units qhs x 30 days 10 mL   . KLOR-CON 10 10 MEQ tablet TAKE 1 TABLET (10 MEQ TOTAL) BY MOUTH 2 (TWO) TIMES DAILY. 180 tablet 0  . metFORMIN (GLUCOPHAGE) 1000 MG tablet Take 1 tablet (1,000 mg total) by mouth 2 (two) times daily with a meal.    . polyethylene glycol (MIRALAX) packet Take 17 g by mouth daily as needed for mild constipation. 14 each 0  . rosuvastatin (CRESTOR) 10 MG tablet Take 1 tablet (10 mg total) by mouth daily.    . sacubitril-valsartan (ENTRESTO) 24-26 MG Take 1 tablet by mouth 2 (two) times daily. 60 tablet 3  . tiZANidine (ZANAFLEX) 4 MG tablet Take 4 mg by mouth 3 (three) times daily as needed for muscle spasms.    Marland Kitchen eplerenone (INSPRA) 25 MG tablet Take 1 tablet (25 mg total) by mouth daily. 30 tablet 3  . metoprolol succinate (TOPROL-XL) 25 MG 24 hr tablet Take 1 tablet (25 mg total) by mouth 2 (two) times daily. 60 tablet 6   No current facility-administered medications for this encounter.     BP 130/65  Pulse 60  Ht 5\' 9"  (1.753 m)  Wt 111.131 kg (245 lb)  BMI 36.16 kg/m2 General: NAD Neck: No JVD, no thyromegaly or thyroid nodule.  Lungs: Mild rhonchi CV: Nondisplaced PMI.  Heart regular S1/S2, no S3/S4, 1/6 early SEM.  No edema.  No carotid bruit.  I am unable to palpate left PT pulse.  Abdomen: Soft, nontender, no hepatosplenomegaly, no distention.  Skin: Maculopapular erythematous rash   Neurologic: Alert and oriented x 3.  Psych: Normal affect. Extremities: No clubbing or cyanosis.  HEENT: Normal.   Assessment/Plan: 1. Atrial fibrillation: Paroxysmal.  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 She was admitted in 1/18 with atrial fibrillation/RVR and CHF.  She was diurese and had DCCV back to NSR.   - Continue  amiodarone 200 mg daily.  Normal LFTs recently.  Mildly increased TSH but free T4 and free T3.  She will need regular eye exams.  Long-term, I would rather not have her on amiodarone, especially with COPD history.  Based on CASTLE-HF data, I think we should consider atrial fibrillation ablation.  She has seen Dr. [name] and plan is for atrial fibrillation ablation. - Continue Eliquis.    2. Claudication: Left leg claudication with absent left PT pulse.  Suspect this is related to prior damage to the iliac artery on that side, peripheral arterial dopplers in 5/15 confirmed this.  3. Chronic systolic CHF: EF [id number] on echo in 1/18, down from 50-55% in 4/15.  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  If so, should improve in NSR.  NYHA class II symptoms, she is not volume overloaded on exam.  - Continue Toprol XL 25 mg bid.  - Continue Entresto 24/26 bid.   - Continue Lasix 40 qam/20 qpm.  - ?rash from spironolactone, now off.  I will have her start eplerenone 25 mg daily.  BMET 10 days.  - Repeat echo in 4/18.  If EF is still down, would plan right and left heart cath.  If improved, probably was tachy-mediated CMP.    Followup in 4/18 with echo.   [name] 09/18/2016

## 2016-09-21 ENCOUNTER — Telehealth: Payer: Self-pay | Admitting: Cardiology

## 2016-09-21 DIAGNOSIS — I480 Paroxysmal atrial fibrillation: Secondary | ICD-10-CM

## 2016-09-21 NOTE — Telephone Encounter (Signed)
Patient cannot do the 20th.  She requests 24th.  Pt understands I will make the changes and call her soon to review instructions.

## 2016-09-21 NOTE — Telephone Encounter (Signed)
Patient is calling states that she selected the wrong date for the procedure and would like to change it. Please call, thanks.

## 2016-09-22 NOTE — Telephone Encounter (Signed)
Advised I would call her next week to review instructions.

## 2016-09-22 NOTE — Telephone Encounter (Signed)
F/u Message ° °Pt returning RN call. Please call back to discuss  °

## 2016-09-22 NOTE — Telephone Encounter (Signed)
F/u Message  Pt returning RN call about instructions. Please call back to discuss

## 2016-09-26 ENCOUNTER — Encounter: Payer: Self-pay | Admitting: *Deleted

## 2016-09-26 NOTE — Telephone Encounter (Signed)
Scheduled AFib ablation for 4/24. H&P and pre procedure labs scheduled for 4/11. Letter of instructions sent thru [place]. She understands office will call her to arrange pre procedure AFib cardiac CT. Patient verbalized understanding and agreeable to plan.

## 2016-09-28 ENCOUNTER — Ambulatory Visit (HOSPITAL_COMMUNITY)
Admission: RE | Admit: 2016-09-28 | Discharge: 2016-09-28 | Disposition: A | Discharge status: Home / Self Care | Payer: Medicare Other | Source: Ambulatory Visit | Attending: Cardiology | Admitting: Cardiology

## 2016-09-28 DIAGNOSIS — I5022 Chronic systolic (congestive) heart failure: Secondary | ICD-10-CM | POA: Diagnosis present

## 2016-09-28 LAB — BASIC METABOLIC PANEL
Anion gap: 11 (ref 5–15)
BUN: 12 mg/dL (ref 6–20)
CHLORIDE: 97 mmol/L — AB (ref 101–111)
CO2: 32 mmol/L (ref 22–32)
Calcium: 9 mg/dL (ref 8.9–10.3)
Creatinine, Ser: 0.79 mg/dL (ref 0.44–1.00)
GFR calc non Af Amer: >60 mL/min (ref >60)
Glucose, Bld: 172 mg/dL — ABNORMAL HIGH (ref 65–99)
POTASSIUM: 4 mmol/L (ref 3.5–5.1)
SODIUM: 140 mmol/L (ref 135–145)

## 2016-10-07 ENCOUNTER — Other Ambulatory Visit: Payer: Self-pay | Admitting: Family Medicine

## 2016-10-09 ENCOUNTER — Telehealth: Payer: Self-pay | Admitting: Family Medicine

## 2016-10-09 NOTE — Telephone Encounter (Signed)
Patient was prescribed advair and is on amiodarone (Dr. [name]) and there is an interaction. Ok to take May cause prolonged QT interval  [facility] call back number is [phone]

## 2016-10-10 MED ORDER — TIOTROPIUM BROMIDE MONOHYDRATE 18 MCG IN CAPS: 18.0000 ug | ORAL_CAPSULE | Freq: Every day | RESPIRATORY_TRACT | 6 refills | Status: DC

## 2016-10-10 MED ORDER — BECLOMETHASONE DIPROPIONATE 40 MCG/ACT IN AERS: 2.0000 | INHALATION_SPRAY | Freq: Two times a day (BID) | RESPIRATORY_TRACT | 6 refills | Status: DC

## 2016-10-10 NOTE — Telephone Encounter (Signed)
Updated medication list/pharmacy informed and sent in/they will contact the patient

## 2016-10-10 NOTE — Telephone Encounter (Signed)
D/c advair qvar 40 mcg  2 puffs bid  spiriva 1 cap inhaled qd  #30  2 refills

## 2016-10-11 ENCOUNTER — Telehealth (HOSPITAL_COMMUNITY): Payer: Self-pay | Admitting: *Deleted

## 2016-10-11 ENCOUNTER — Encounter: Payer: Self-pay | Admitting: Cardiology

## 2016-10-11 ENCOUNTER — Other Ambulatory Visit: Payer: Self-pay | Admitting: Family Medicine

## 2016-10-11 ENCOUNTER — Telehealth: Payer: Self-pay

## 2016-10-11 NOTE — Telephone Encounter (Signed)
PA initiated via Covermymeds; KEY: [id number]. Awaiting determination.

## 2016-10-11 NOTE — Telephone Encounter (Signed)
Advise on inhaler question, losatan and entresto

## 2016-10-11 NOTE — Telephone Encounter (Signed)
Caller name: Relationship to patient: Can be reached: Pharmacy:  [id number] - [place], [place] - [address] [place] [phone] (Phone) [phone] (Fax)    Reason for call: Pharmacy called stating that QVAR AER is discontinued and need Rx for QVAR Redihaler.  ALSO: Does patient suppose to be taking Losartan and Entresto?

## 2016-10-11 NOTE — Telephone Encounter (Signed)
Request Reference Number: [id number]. QVAR AER 40MCG is approved through 07/16/2017. For further questions, call [phone]

## 2016-10-11 NOTE — Telephone Encounter (Signed)
[facility] to clarify if patient is suppose to be taking losartan and entresto.  After looking at previous office notes Losartan was stopped on Feb 5th per Dr. [name] and started on [place].  Dr. Rosalita Chessman Chase sent refills for Losartan from their office today.  Pharmacy is aware this is incorrect and that Dr. [name] discontinued this medication on Feb 5th.  Pharmacist will not refill medication.

## 2016-10-12 MED ORDER — BECLOMETHASONE DIPROP HFA 40 MCG/ACT IN AERB: 2.0000 | INHALATION_SPRAY | Freq: Two times a day (BID) | RESPIRATORY_TRACT | 5 refills | Status: DC

## 2016-10-12 NOTE — Telephone Encounter (Signed)
PA  For Qvar approved through 07/16/2017 under her medicare Part D benefit Pharmacy/patient informed Letter sent to scan.

## 2016-10-12 NOTE — Telephone Encounter (Signed)
Ok to change

## 2016-10-12 NOTE — Addendum Note (Signed)
Addended by: Sharon Seller B on: 10/12/2016 09:50 AM   Modules accepted: Orders

## 2016-10-12 NOTE — Telephone Encounter (Signed)
Sent in new inhaler

## 2016-10-13 NOTE — Addendum Note (Signed)
Addended by: Stanton Kidney on: 10/13/2016 03:06 PM   Modules accepted: Orders

## 2016-10-16 ENCOUNTER — Encounter: Payer: Self-pay | Admitting: Cardiology

## 2016-10-23 ENCOUNTER — Telehealth: Payer: Self-pay | Admitting: Family Medicine

## 2016-10-23 MED ORDER — GLUCOSE BLOOD VI STRP: ORAL_STRIP | 6 refills | Status: DC

## 2016-10-23 NOTE — Telephone Encounter (Signed)
Called the patient left a message strips sent in to pharmacy

## 2016-10-23 NOTE — Telephone Encounter (Signed)
Spoke to pt. She needs refill on test strips one touch verio.  Please advise  [facility]

## 2016-10-23 NOTE — Telephone Encounter (Signed)
Pt would like me to call back in June to schedule AWV.

## 2016-10-25 ENCOUNTER — Encounter: Payer: Self-pay | Admitting: Cardiology

## 2016-10-25 ENCOUNTER — Ambulatory Visit (INDEPENDENT_AMBULATORY_CARE_PROVIDER_SITE_OTHER): Payer: Medicare Other | Admitting: Cardiology

## 2016-10-25 VITALS — BP 112/62 | HR 58 | Ht 66.0 in | Wt 256.4 lb

## 2016-10-25 DIAGNOSIS — Z01812 Encounter for preprocedural laboratory examination: Secondary | ICD-10-CM

## 2016-10-25 DIAGNOSIS — I481 Persistent atrial fibrillation: Secondary | ICD-10-CM | POA: Diagnosis not present

## 2016-10-25 DIAGNOSIS — I4819 Other persistent atrial fibrillation: Secondary | ICD-10-CM

## 2016-10-25 NOTE — Patient Instructions (Signed)
Medication Instructions:    Your physician recommends that you continue on your current medications as directed. Please refer to the Current Medication list given to you today.  --- If you need a refill on your cardiac medications before your next appointment, please call your pharmacy. ---  Labwork:  None ordered  Testing/Procedures:  None ordered  Follow-Up:  Your physician recommends that you schedule a follow-up appointment in: 4 weeks, after your procedure on 11/07/16, with Roderic Palau in the AFib clinic.   Your physician recommends that you schedule a follow-up appointment in: 3 months, after your procedure on 11/07/16, with Dr. [name]  Thank you for choosing [facility]!!   Trinidad Curet, RN [phone]       .

## 2016-10-25 NOTE — Progress Notes (Signed)
Electrophysiology Office Note   Date:  10/25/2016   ID:  Heidi, Stark [birth year], MRN [id number]  PCP:  Ann Held, DO  Cardiologist:  [name] Primary Electrophysiologist:  [name], MD    Chief Complaint  Patient presents with  . Follow-up    PAF/H&P and pre labs     History of Present Illness: Heidi Stark is a 76 y.o. female who presents today for electrophysiology evaluation.   History of hypertension, diabetes, COPD, and hyperlipidemia. She was admitted on 4/15 with splenic and renal infarcts and found to have paroxysmal atrial fibrillation. TEE at the time showed prominent aortic plaques. She was started on Coumadin at that time. One month later, she was found to have an external iliac artery occlusion. She did well until 1/18 when she developed palpitations and exertional dyspnea. She was admitted to [facility] with A. fib and RVR and acute systolic heart failure. Her EF was found to be 30-35%, down from 50-55% in 2015. He was diuresed and underwent cardioversion back to sinus rhythm. Plan for AF ablation 11/07/16   Today, she denies symptoms of palpitations, chest pain, shortness of breath, orthopnea, PND, lower extremity edema, claudication, dizziness, presyncope, syncope, bleeding, or neurologic sequela. The patient is tolerating medications without difficulties and is otherwise without complaint today.    Past Medical History:  Diagnosis Date  . COPD (chronic obstructive pulmonary disease) ([place])   . Diabetes mellitus    Type II  . DVT (deep venous thrombosis) ([place])   . Hyperlipidemia   . Hypertension   . PAF (paroxysmal atrial fibrillation) ([place])    Past Surgical History:  Procedure Laterality Date  . BACK SURGERY     vein graft and colon repair after nicked artery with back surgert  . CARDIOVERSION N/A 08/11/2016   Procedure: CARDIOVERSION;  Surgeon: Larey Dresser, MD;  Location: [place];  Service: Cardiovascular;  Laterality:  N/A;  . COLONOSCOPY    . CYSTECTOMY     between bladder and kidneys  . GANGLION CYST EXCISION    . KNEE SURGERY     Right Knee cart.Removal  . TEE WITHOUT CARDIOVERSION N/A 10/31/2013   Procedure: TRANSESOPHAGEAL ECHOCARDIOGRAM (TEE);  Surgeon: Larey Dresser, MD;  Location: [place];  Service: Cardiovascular;  Laterality: N/A;  . TUBAL LIGATION       Current Outpatient Prescriptions  Medication Sig Dispense Refill  . amiodarone (PACERONE) 200 MG tablet Take 1 tablet (200 mg total) by mouth daily. 30 tablet 3  . apixaban (ELIQUIS) 5 MG TABS tablet Take 1 tablet (5 mg total) by mouth 2 (two) times daily. 180 tablet 3  . eplerenone (INSPRA) 25 MG tablet Take 1 tablet (25 mg total) by mouth daily. 30 tablet 3  . fenofibrate 160 MG tablet TAKE 1 TABLET (160 MG TOTAL) BY MOUTH DAILY. 90 tablet 1  . furosemide (LASIX) 20 MG tablet TAKE 2 TABLETS IN THE MORNING AND 1 TABLET IN THE EVENING 90 tablet 1  . glimepiride (AMARYL) 2 MG tablet Take 1 tablet (2 mg total) by mouth daily with breakfast. (Patient taking differently: Take 2-4 mg by mouth See admin instructions. Takes 2 tabs in am and 1 tab in pm)    . glucose blood (ONETOUCH VERIO) test strip Use as directed once daily to check blood sugar  E11.9 100 each 6  . insulin glargine (LANTUS) 100 UNIT/ML injection Inject 0.15 mLs (15 Units total) into the skin at bedtime. 10 units  qhs x 30 days 10 mL   . KLOR-CON 10 10 MEQ tablet TAKE 1 TABLET (10 MEQ TOTAL) BY MOUTH 2 (TWO) TIMES DAILY. 180 tablet 0  . metFORMIN (GLUCOPHAGE) 1000 MG tablet Take 1 tablet (1,000 mg total) by mouth 2 (two) times daily with a meal.    . metoprolol succinate (TOPROL-XL) 25 MG 24 hr tablet Take 1 tablet (25 mg total) by mouth 2 (two) times daily. 60 tablet 6  . polyethylene glycol (MIRALAX) packet Take 17 g by mouth daily as needed for mild constipation. 14 each 0  . rosuvastatin (CRESTOR) 10 MG tablet Take 1 tablet (10 mg total) by mouth daily.    .  sacubitril-valsartan (ENTRESTO) 24-26 MG Take 1 tablet by mouth 2 (two) times daily. 60 tablet 3  . tiZANidine (ZANAFLEX) 4 MG tablet Take 4 mg by mouth 3 (three) times daily as needed for muscle spasms.     No current facility-administered medications for this visit.     Allergies:   Neomycin-bacitracin zn-polymyx; Sulfonamide derivatives; Ciprofloxacin; and Spironolactone   Social History:  The patient  reports that she has been smoking Cigarettes.  She has been smoking about 0.50 packs per day. She has never used smokeless tobacco. She reports that she does not drink alcohol or use drugs.   Family History:  The patient's family history includes Factor V Leiden deficiency in her daughter.    ROS:  Please see the history of present illness.   Otherwise, review of systems is positive for weight change.   All other systems are reviewed and negative.    PHYSICAL EXAM: VS:  BP 112/62   Pulse (!) 58   Ht 5\' 6"  (1.676 m)   Wt 256 lb 6.4 oz (116.3 kg)   BMI 41.38 kg/m  , BMI Body mass index is 41.38 kg/m. GEN: Well nourished, well developed, in no acute distress  HEENT: normal  Neck: no JVD, carotid bruits, or masses Cardiac: RRR; no murmurs, rubs, or gallops,no edema  Respiratory:  clear to auscultation bilaterally, normal work of breathing GI: soft, nontender, nondistended, + BS MS: no deformity or atrophy  Skin: warm and dry Neuro:  Strength and sensation are intact Psych: euthymic mood, full affect  EKG:  EKG is not ordered today. Personal review of the ekg ordered 08/21/16 shows sinus rhythm, rate 59, nonspecific IVCD  Recent Labs: 08/09/2016: B Natriuretic Peptide 315.4 08/11/2016: Magnesium 2.4 08/21/2016: ALT 30; Hemoglobin 15.6; Platelets 238 08/31/2016: TSH 5.862 09/28/2016: BUN 12; Creatinine, Ser 0.79; Potassium 4.0; Sodium 140    Lipid Panel     Component Value Date/Time   CHOL 112 10/11/2015 1012   TRIG 187.0 (H) 10/11/2015 1012   HDL 28.90 (L) 10/11/2015 1012    CHOLHDL 4 10/11/2015 1012   VLDL 37.4 10/11/2015 1012   LDLCALC 46 10/11/2015 1012   LDLDIRECT 42.0 11/30/2014 1226     Wt Readings from Last 3 Encounters:  10/25/16 256 lb 6.4 oz (116.3 kg)  09/18/16 252 lb (114.3 kg)  08/28/16 251 lb (113.9 kg)    Other studies Reviewed: Additional studies/ records that were reviewed today include: TTE 08/10/16   Review of the above records today demonstrates:  - Left ventricle: The cavity size was mildly dilated. There was   moderate concentric hypertrophy. Systolic function was moderately   to severely reduced. The estimated ejection fraction was in the   range of 30% to 35%. Wall motion was normal; there were no   regional wall motion  abnormalities. The study was not technically   sufficient to allow evaluation of LV diastolic dysfunction due to   atrial fibrillation. - Aortic valve: Valve area (VTI): 1.8 cm^2. Valve area (Vmax): 1.64   cm^2. Valve area (Vmean): 1.79 cm^2. - Mitral valve: Calcified annulus. Mildly thickened leaflets .   There was mild regurgitation. - Left atrium: The atrium was moderately dilated. - Right ventricle: The cavity size was mildly dilated. Wall   thickness was normal. - Right atrium: The atrium was normal in size. - Tricuspid valve: There was moderate regurgitation. - Pulmonic valve: There was mild regurgitation. - Pulmonary arteries: Systolic pressure was severely increased. PA   peak pressure: 55 mm Hg (S). - Inferior vena cava: The vessel was dilated. The respirophasic   diameter changes were blunted (< 50%), consistent with elevated   central venous pressure. - Pericardium, extracardiac: There was no pericardial effusion.   ASSESSMENT AND PLAN:  1.  Paroxysmal atrial fibrillation: Currently on amiodarone and Eliquis for anticoagulation. Plan for AF ablation 11/07/16. Risks and benefits of ablation were discussed. Risks include bleeding, tamponade, heart block, stroke, and damage to surrounding organs. She  understands these risks and has agreed to ablation.  2. Chronic systolic heart failure: EF down to 30-35% from 50-55% in 2015. 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 She is currently on Toprol-XL and Entresto. We'll continue current medications.  3. Claudication: Absent left PT pulse previously. Likely related to prior iliac artery damage on that side.  4. Obstructive sleep apnea: Compliant with CPAP  Current medicines are reviewed at length with the patient today.   The patient does not have concerns regarding her medicines.  The following changes were made today:  none  Labs/ tests ordered today include:  No orders of the defined types were placed in this encounter.    Disposition:   FU with Will Camnitz 3 months  Signed, [name], MD  10/25/2016 11:27 AM     [facility] [address] [place] [place] [address] [phone] (office) [phone] (fax)

## 2016-10-25 NOTE — Addendum Note (Signed)
Addended by: Stanton Kidney on: 10/25/2016 11:35 AM   Modules accepted: Orders

## 2016-10-26 LAB — BASIC METABOLIC PANEL
BUN / CREAT RATIO: 22 (ref 12–28)
BUN: 19 mg/dL (ref 8–27)
CALCIUM: 9.1 mg/dL (ref 8.7–10.3)
CHLORIDE: 98 mmol/L (ref 96–106)
CO2: 29 mmol/L (ref 18–29)
Creatinine, Ser: 0.86 mg/dL (ref 0.57–1.00)
GFR calc Af Amer: 77 mL/min/{1.73_m2} (ref >59)
GFR calc non Af Amer: 67 mL/min/{1.73_m2} (ref >59)
GLUCOSE: 64 mg/dL — AB (ref 65–99)
POTASSIUM: 4.2 mmol/L (ref 3.5–5.2)
Sodium: 144 mmol/L (ref 134–144)

## 2016-10-26 LAB — CBC WITH DIFFERENTIAL/PLATELET
BASOS ABS: 0 10*3/uL (ref 0.0–0.2)
Basos: 0 %
EOS (ABSOLUTE): 0.2 10*3/uL (ref 0.0–0.4)
Eos: 2 %
Hematocrit: 45.5 % (ref 34.0–46.6)
Hemoglobin: 15.2 g/dL (ref 11.1–15.9)
Immature Grans (Abs): 0 10*3/uL (ref 0.0–0.1)
Immature Granulocytes: 0 %
LYMPHS ABS: 3.2 10*3/uL — AB (ref 0.7–3.1)
LYMPHS: 35 %
MCH: 29.5 pg (ref 26.6–33.0)
MCHC: 33.4 g/dL (ref 31.5–35.7)
MCV: 88 fL (ref 79–97)
MONOCYTES: 10 %
Monocytes Absolute: 0.9 10*3/uL (ref 0.1–0.9)
Neutrophils Absolute: 5 10*3/uL (ref 1.4–7.0)
Neutrophils: 53 %
PLATELETS: 305 10*3/uL (ref 150–379)
RBC: 5.15 x10E6/uL (ref 3.77–5.28)
RDW: 16.4 % — ABNORMAL HIGH (ref 12.3–15.4)
WBC: 9.3 10*3/uL (ref 3.4–10.8)

## 2016-11-01 ENCOUNTER — Ambulatory Visit (HOSPITAL_COMMUNITY)
Admission: RE | Admit: 2016-11-01 | Discharge: 2016-11-01 | Disposition: A | Discharge status: Home / Self Care | Payer: Medicare Other | Source: Ambulatory Visit | Attending: Cardiology | Admitting: Cardiology

## 2016-11-01 ENCOUNTER — Ambulatory Visit (HOSPITAL_BASED_OUTPATIENT_CLINIC_OR_DEPARTMENT_OTHER)
Admission: RE | Admit: 2016-11-01 | Discharge: 2016-11-01 | Disposition: A | Discharge status: Home / Self Care | Payer: Medicare Other | Source: Ambulatory Visit | Attending: Cardiology | Admitting: Cardiology

## 2016-11-01 ENCOUNTER — Ambulatory Visit (HOSPITAL_COMMUNITY)
Admission: RE | Admit: 2016-11-01 | Discharge: 2016-11-01 | Disposition: A | Discharge status: Home / Self Care | Payer: Medicare Other | Source: Ambulatory Visit | Attending: Internal Medicine | Admitting: Internal Medicine

## 2016-11-01 ENCOUNTER — Encounter (HOSPITAL_COMMUNITY): Payer: Self-pay

## 2016-11-01 VITALS — BP 140/78 | HR 64 | Wt 256.0 lb

## 2016-11-01 DIAGNOSIS — I480 Paroxysmal atrial fibrillation: Secondary | ICD-10-CM

## 2016-11-01 DIAGNOSIS — I420 Dilated cardiomyopathy: Secondary | ICD-10-CM | POA: Insufficient documentation

## 2016-11-01 DIAGNOSIS — I5022 Chronic systolic (congestive) heart failure: Secondary | ICD-10-CM | POA: Diagnosis not present

## 2016-11-01 DIAGNOSIS — I5030 Unspecified diastolic (congestive) heart failure: Secondary | ICD-10-CM | POA: Diagnosis not present

## 2016-11-01 DIAGNOSIS — I350 Nonrheumatic aortic (valve) stenosis: Secondary | ICD-10-CM | POA: Diagnosis not present

## 2016-11-01 DIAGNOSIS — I4891 Unspecified atrial fibrillation: Secondary | ICD-10-CM | POA: Diagnosis not present

## 2016-11-01 DIAGNOSIS — I340 Nonrheumatic mitral (valve) insufficiency: Secondary | ICD-10-CM | POA: Insufficient documentation

## 2016-11-01 LAB — ECHOCARDIOGRAM COMPLETE
AO mean calculated velocity dopler: 151 cm/s
AOPV: 0.53 m/s
AV Area VTI index: 0.75 cm2/m2
AV Mean grad: 11 mmHg
AV Peak grad: 21 mmHg
AV pk vel: 230 cm/s
AVAREAMEANV: 1.62 cm2
AVAREAMEANVIN: 0.68 cm2/m2
AVAREAVTI: 1.67 cm2
AVCELMEANRAT: 0.52
CHL CUP AV PEAK INDEX: 0.7
CHL CUP AV VEL: 1.78
CHL CUP MV DEC (S): 359
E decel time: 359 msec
E/e' ratio: 20.46
FS: 27 % — AB (ref 28–44)
IV/PV OW: 1
LA ID, A-P, ES: 55 mm
LA diam end sys: 55 mm
LA vol A4C: 109 ml
LA vol: 95.1 mL
LADIAMINDEX: 2.3 cm/m2
LAVOLIN: 39.9 mL/m2
LDCA: 3.14 cm2
LV E/e' medial: 20.46
LV PW d: 13 mm — AB (ref 0.6–1.1)
LV SIMPSON'S DISK: 50
LV dias vol index: 38 mL/m2
LV dias vol: 91 mL (ref 46–106)
LVEEAVG: 20.46
LVOT SV: 95 mL
LVOT VTI: 30.1 cm
LVOT peak grad rest: 6 mmHg
LVOTD: 20 mm
LVOTPV: 122 cm/s
LVOTVTI: 0.57 cm
LVSYSVOL: 46 mL — AB
LVSYSVOLIN: 19 mL/m2
Lateral S' vel: 14.8 cm/s
MV Peak grad: 3 mmHg
MVPKAVEL: 108 m/s
MVPKEVEL: 88.6 m/s
Stroke v: 45 ml
TAPSE: 24.4 mm
TDI e' medial: 4.33
VTI: 53.1 cm
Valve area index: 0.75
Valve area: 1.78 cm2

## 2016-11-01 MED ORDER — IOPAMIDOL (ISOVUE-370) INJECTION 76%
INTRAVENOUS | Status: AC
  Administered 2016-11-01: 80 mL
  Filled 2016-11-01: qty 100

## 2016-11-01 MED ORDER — METOPROLOL TARTRATE 5 MG/5ML IV SOLN
5.0000 mg | INTRAVENOUS | Status: DC | PRN
  Filled 2016-11-01: qty 5

## 2016-11-01 NOTE — Progress Notes (Signed)
  Echocardiogram 2D Echocardiogram has been performed.  Johny Chess 11/01/2016, 3:12 PM

## 2016-11-02 NOTE — Progress Notes (Signed)
Patient ID: Heidi Stark, female   DOB: 1941/01/09, 76 y.o.   MRN: [id number] PCP: Dr. [name] Cardiology: Dr. [name]  76 yo with history of HTN, DM, hyperlipidemia presents for cardiology followup.  She was admitted in 4/15 with splenic and renal infarcts.  She was found to have paroxysmal atrial fibrillation.  In 4/15, she had fever and flank pain.  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  TEE showed prominent aortic plaque.  While she was in the hospital, she was noted to have a brief run of atrial fibrillation and was started on coumadin.    She has left leg pain with ambulation after about 100 feet, LE dopplers 5/15 showed occluded left external iliac artery.  This has been present ever since she had left iliac damage with a prior back surgery.    She was stable until 1/18, when she developed palpitations and exertional dyspnea.  She was admitted to [facility] with atrial fibrillation/RVR and acute systolic CHF.  EF was found to be 30-35%, down from 50-55% in 2015.  She was diuresed and underwent DCCV back to NSR.  She remains in NSR today.    Overall, breathing is much better.  No dyspnea walking on flat ground.  No orthopnea/PND, no chest pain, no lightheadedness.  Spironolactone was stopped as there was concern that it caused a drug rash.  Rash resolved when spironolactone was stopped.  No palpitations.  Echo was done today and I reviewed, EF up to 50%.    Labs (4/15): K 4.1, creatinine 0.7, HCT 46.2 Labs (7/15): K 3.9, creatinine 0.8, HCT 50 Labs (12/15): K 5, creatinine 0.7, HDL 33, LDL 57 Labs (5/16): K 4, creatinine 0.84, TGs 256, HDL 29, LDL 42, HCT 41.2 Labs (1/18): K 3.4, creatinine 0.78 Labs (2/18): K 4.4, creatinine 0.8, free T3/T4 normal, mildly elevated TSH, LFTs normal, hgb 15.6 Labs (4/18): K 4.2, creatinine 0.86, TSH elevated, free T3 and free T4 normal  PMH: 1.Type II diabetes 2. HTN 3. Hyperlipidemia 4. COPD 5. CAD: Coronary CTA  (4/18) with nonobstructive coronary disease, calcium score 335 Agatston units (82nd percentile).  6. Paroxysmal atrial fibrillation: First noted in 4/15.  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   - Event monitor (4/15) with no atrial fibrillation.  - Admission with atrial fibrillation/RVR in 1/18.  7. Chronic systolic CHF: ?tachycardia-mediated as first noted in setting of atrial fibrillation with RVR.  - TEE (4/15) with EF 50-55%, mild LVH, grade III-IV plaque in descending thoracic aorta, RV normal, no PFO, peak RV-RA gradient 42 mmHg.   - Echo (1/18) with EF 30-35%, diffuse hypokinesis, moderate LVH, mildly dilated RV, PASP 55 mmHg.  - Echo (4/18) with EF 50%, anterolateral/inferolateral mild hypokinesis, mild aortic stenosis, IVC normal.  7. H/o diskectomy. 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 Lower extremity arterial dopplers (5/15) with occluded left external iliac artery.  9. Aortic stenosis: Mild on 4/18 echo.   SH: Married, 2 kids, quit smoking in 2015.  FH: Daughter with factor V Leiden deficiency.  Brother with CABG.  Sister with aneurysm.   ROS: All systems reviewed and negative except as per HPI.   Current Outpatient Prescriptions  Medication Sig Dispense Refill  . amiodarone (PACERONE) 200 MG tablet Take 1 tablet (200 mg total) by mouth daily. 30 tablet 3  . apixaban (ELIQUIS) 5 MG TABS tablet Take 1 tablet (5 mg total) by  mouth 2 (two) times daily. 180 tablet 3  . eplerenone (INSPRA) 25 MG tablet Take 1 tablet (25 mg total) by mouth daily. 30 tablet 3  . fenofibrate 160 MG tablet TAKE 1 TABLET (160 MG TOTAL) BY MOUTH DAILY. 90 tablet 1  . furosemide (LASIX) 20 MG tablet TAKE 2 TABLETS IN THE MORNING AND 1 TABLET IN THE EVENING 90 tablet 1  . glimepiride (AMARYL) 2 MG tablet Take 1 tablet (2 mg total) by mouth daily with breakfast. (Patient taking differently: Take 2-4 mg by mouth See admin instructions. Takes 2  tabs in am and 1 tab in pm)    . glucose blood (ONETOUCH VERIO) test strip Use as directed once daily to check blood sugar  E11.9 100 each 6  . insulin glargine (LANTUS) 100 UNIT/ML injection Inject 0.15 mLs (15 Units total) into the skin at bedtime. 10 units qhs x 30 days 10 mL   . KLOR-CON 10 10 MEQ tablet TAKE 1 TABLET (10 MEQ TOTAL) BY MOUTH 2 (TWO) TIMES DAILY. 180 tablet 0  . metFORMIN (GLUCOPHAGE) 1000 MG tablet Take 1 tablet (1,000 mg total) by mouth 2 (two) times daily with a meal.    . metoprolol succinate (TOPROL-XL) 25 MG 24 hr tablet Take 1 tablet (25 mg total) by mouth 2 (two) times daily. 60 tablet 6  . polyethylene glycol (MIRALAX) packet Take 17 g by mouth daily as needed for mild constipation. 14 each 0  . rosuvastatin (CRESTOR) 10 MG tablet Take 1 tablet (10 mg total) by mouth daily.    . sacubitril-valsartan (ENTRESTO) 24-26 MG Take 1 tablet by mouth 2 (two) times daily. 60 tablet 3  . tiZANidine (ZANAFLEX) 4 MG tablet Take 4 mg by mouth 3 (three) times daily as needed for muscle spasms.     No current facility-administered medications for this encounter.     BP 130/65  Pulse 60  Ht 5\' 9"  (1.753 m)  Wt 111.131 kg (245 lb)  BMI 36.16 kg/m2 General: NAD Neck: JVP 7 cm, no thyromegaly or thyroid nodule.  Lungs: CTAB CV: Nondisplaced PMI.  Heart regular S1/S2, no S3/S4, 2/6 early SEM.  No edema.  No carotid bruit.  I am unable to palpate left PT pulse.  Abdomen: Soft, nontender, no hepatosplenomegaly, no distention.  Skin: Maculopapular erythematous rash   Neurologic: Alert and oriented x 3.  Psych: Normal affect. Extremities: No clubbing or cyanosis.  HEENT: Normal.   Assessment/Plan: 1. Atrial fibrillation: Paroxysmal.  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 She was admitted in 1/18 with atrial fibrillation/RVR and CHF.  She was diuresed and had DCCV back to NSR.   - Continue amiodarone 200 mg daily.  Normal LFTs recently.  Mildly  increased TSH but free T4 and free T3.  She will need regular eye exams.  Long-term, I would rather not have her on amiodarone, especially with COPD history.  - - Dr. [name] will be doing an atrial fibrillation ablation on 4/24. - Continue Eliquis.    2. Claudication: Left leg claudication with absent left PT pulse.  Suspect this is related to prior damage to the iliac artery on that side, peripheral arterial dopplers in 5/15 confirmed this.  3. Chronic systolic CHF: EF 02-63% on echo in 1/18, down from 50-55% in 4/15.  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  NYHA class II symptoms, she is not volume overloaded on exam.  -  Continue Toprol XL 25 mg bid.  - Continue Entresto 24/26 bid.   - Continue Lasix 40 qam/20 qpm.  - ?rash from spironolactone, now on eplerenone 25 mg daily.    4. CAD: Coronary CTA done recently showed coronary calcium score in the 82nd percentile with nonobstructive coronary disease.  - not on ASA 81 as she is taking Eliqius.  - Continue Crestor, check lipids.    [name] 11/02/2016

## 2016-11-03 ENCOUNTER — Telehealth: Payer: Self-pay | Admitting: *Deleted

## 2016-11-03 DIAGNOSIS — R931 Abnormal findings on diagnostic imaging of heart and coronary circulation: Secondary | ICD-10-CM

## 2016-11-03 NOTE — Telephone Encounter (Signed)
Reviewed results with patient who verbalized understanding. She will come by office in several weeks for fasting lipid panel.

## 2016-11-03 NOTE — Telephone Encounter (Signed)
-----   Message from [name], MD sent at 11/02/2016  9:56 AM EDT ----- Preop CTreviewed. High risk for cardiac events based on calcium scoring. Needs fasting lipid panel.

## 2016-11-07 ENCOUNTER — Ambulatory Visit (HOSPITAL_COMMUNITY): Payer: Medicare Other | Admitting: Certified Registered"

## 2016-11-07 ENCOUNTER — Encounter (HOSPITAL_COMMUNITY)
Admission: RE | Disposition: A | Discharge status: Home / Self Care | Payer: Self-pay | Source: Ambulatory Visit | Attending: Cardiology

## 2016-11-07 ENCOUNTER — Encounter (HOSPITAL_COMMUNITY): Payer: Self-pay | Admitting: Cardiology

## 2016-11-07 ENCOUNTER — Ambulatory Visit (HOSPITAL_COMMUNITY)
Admission: RE | Admit: 2016-11-07 | Discharge: 2016-11-08 | Disposition: A | Discharge status: Home / Self Care | Payer: Medicare Other | Source: Ambulatory Visit | Attending: Cardiology | Admitting: Cardiology

## 2016-11-07 DIAGNOSIS — Z882 Allergy status to sulfonamides status: Secondary | ICD-10-CM | POA: Diagnosis not present

## 2016-11-07 DIAGNOSIS — Y838 Other surgical procedures as the cause of abnormal reaction of the patient, or of later complication, without mention of misadventure at the time of the procedure: Secondary | ICD-10-CM | POA: Insufficient documentation

## 2016-11-07 DIAGNOSIS — Z794 Long term (current) use of insulin: Secondary | ICD-10-CM | POA: Insufficient documentation

## 2016-11-07 DIAGNOSIS — I481 Persistent atrial fibrillation: Secondary | ICD-10-CM

## 2016-11-07 DIAGNOSIS — I110 Hypertensive heart disease with heart failure: Secondary | ICD-10-CM | POA: Diagnosis not present

## 2016-11-07 DIAGNOSIS — I5022 Chronic systolic (congestive) heart failure: Secondary | ICD-10-CM | POA: Diagnosis not present

## 2016-11-07 DIAGNOSIS — I97638 Postprocedural hematoma of a circulatory system organ or structure following other circulatory system procedure: Secondary | ICD-10-CM | POA: Insufficient documentation

## 2016-11-07 DIAGNOSIS — E119 Type 2 diabetes mellitus without complications: Secondary | ICD-10-CM | POA: Diagnosis not present

## 2016-11-07 DIAGNOSIS — I4891 Unspecified atrial fibrillation: Secondary | ICD-10-CM | POA: Diagnosis present

## 2016-11-07 DIAGNOSIS — I428 Other cardiomyopathies: Secondary | ICD-10-CM | POA: Diagnosis not present

## 2016-11-07 HISTORY — DX: Other chronic pain: G89.29

## 2016-11-07 HISTORY — DX: Heart failure, unspecified: I50.9

## 2016-11-07 HISTORY — DX: Type 2 diabetes mellitus without complications: E11.9

## 2016-11-07 HISTORY — PX: ATRIAL FIBRILLATION ABLATION: EP1191

## 2016-11-07 HISTORY — DX: Unspecified osteoarthritis, unspecified site: M19.90

## 2016-11-07 HISTORY — DX: Low back pain: M54.5

## 2016-11-07 HISTORY — DX: Pneumonia, unspecified organism: J18.9

## 2016-11-07 HISTORY — DX: Low back pain, unspecified: M54.50

## 2016-11-07 HISTORY — DX: Obstructive sleep apnea (adult) (pediatric): G47.33

## 2016-11-07 HISTORY — DX: Dependence on other enabling machines and devices: Z99.89

## 2016-11-07 HISTORY — DX: Personal history of urinary calculi: Z87.442

## 2016-11-07 HISTORY — DX: Personal history of other medical treatment: Z92.89

## 2016-11-07 LAB — GLUCOSE, CAPILLARY
GLUCOSE-CAPILLARY: 139 mg/dL — AB (ref 65–99)
GLUCOSE-CAPILLARY: 167 mg/dL — AB (ref 65–99)
Glucose-Capillary: 123 mg/dL — ABNORMAL HIGH (ref 65–99)
Glucose-Capillary: 130 mg/dL — ABNORMAL HIGH (ref 65–99)
Glucose-Capillary: 99 mg/dL (ref 65–99)

## 2016-11-07 LAB — POCT ACTIVATED CLOTTING TIME
ACTIVATED CLOTTING TIME: 219 s
ACTIVATED CLOTTING TIME: 257 s
ACTIVATED CLOTTING TIME: 169 s

## 2016-11-07 SURGERY — ATRIAL FIBRILLATION ABLATION
Anesthesia: General

## 2016-11-07 MED ORDER — BUPIVACAINE HCL (PF) 0.25 % IJ SOLN
INTRAMUSCULAR | Status: AC
  Filled 2016-11-07: qty 30

## 2016-11-07 MED ORDER — SUCCINYLCHOLINE CHLORIDE 20 MG/ML IJ SOLN
INTRAMUSCULAR | Status: DC | PRN
  Administered 2016-11-07: 100 mg via INTRAVENOUS

## 2016-11-07 MED ORDER — POLYETHYLENE GLYCOL 3350 17 G PO PACK: 17.0000 g | PACK | Freq: Every day | ORAL | Status: DC | PRN

## 2016-11-07 MED ORDER — DOBUTAMINE IN D5W 4-5 MG/ML-% IV SOLN
INTRAVENOUS | Status: AC
  Filled 2016-11-07: qty 250

## 2016-11-07 MED ORDER — ROSUVASTATIN CALCIUM 10 MG PO TABS
10.0000 mg | ORAL_TABLET | Freq: Every day | ORAL | Status: DC
  Administered 2016-11-07: 10 mg via ORAL
  Filled 2016-11-07: qty 1

## 2016-11-07 MED ORDER — SODIUM CHLORIDE 0.9 % IV SOLN: 250.0000 mL | INTRAVENOUS | Status: DC | PRN

## 2016-11-07 MED ORDER — HEPARIN (PORCINE) IN NACL 2-0.9 UNIT/ML-% IJ SOLN
INTRAMUSCULAR | Status: AC
  Filled 2016-11-07: qty 500

## 2016-11-07 MED ORDER — APIXABAN 5 MG PO TABS
5.0000 mg | ORAL_TABLET | Freq: Two times a day (BID) | ORAL | Status: DC
  Administered 2016-11-08: 5 mg via ORAL
  Filled 2016-11-07: qty 1

## 2016-11-07 MED ORDER — PHENYLEPHRINE HCL 10 MG/ML IJ SOLN
INTRAMUSCULAR | Status: DC | PRN
  Administered 2016-11-07: 25 ug/min via INTRAVENOUS

## 2016-11-07 MED ORDER — FENTANYL CITRATE (PF) 100 MCG/2ML IJ SOLN
INTRAMUSCULAR | Status: AC
  Administered 2016-11-07: 25 ug via INTRAVENOUS
  Filled 2016-11-07: qty 2

## 2016-11-07 MED ORDER — OXYCODONE HCL 5 MG/5ML PO SOLN: 5.0000 mg | Freq: Once | ORAL | Status: DC | PRN

## 2016-11-07 MED ORDER — METOPROLOL SUCCINATE ER 25 MG PO TB24
25.0000 mg | ORAL_TABLET | Freq: Two times a day (BID) | ORAL | Status: DC
  Administered 2016-11-07 – 2016-11-08 (×2): 25 mg via ORAL
  Filled 2016-11-07 (×2): qty 1

## 2016-11-07 MED ORDER — LIDOCAINE 2% (20 MG/ML) 5 ML SYRINGE
INTRAMUSCULAR | Status: DC | PRN
  Administered 2016-11-07: 60 mg via INTRAVENOUS

## 2016-11-07 MED ORDER — DOBUTAMINE IN D5W 4-5 MG/ML-% IV SOLN
INTRAVENOUS | Status: DC | PRN
  Administered 2016-11-07: 20 ug/kg/min via INTRAVENOUS

## 2016-11-07 MED ORDER — PHENYLEPHRINE HCL 10 MG/ML IJ SOLN
INTRAMUSCULAR | Status: DC | PRN
  Administered 2016-11-07 (×2): 60 ug via INTRAVENOUS
  Administered 2016-11-07: 80 ug via INTRAVENOUS

## 2016-11-07 MED ORDER — FUROSEMIDE 20 MG PO TABS
20.0000 mg | ORAL_TABLET | Freq: Two times a day (BID) | ORAL | Status: DC
  Administered 2016-11-07 – 2016-11-08 (×2): 20 mg via ORAL
  Filled 2016-11-07 (×2): qty 1

## 2016-11-07 MED ORDER — FENOFIBRATE 160 MG PO TABS
160.0000 mg | ORAL_TABLET | Freq: Every day | ORAL | Status: DC
  Administered 2016-11-07 – 2016-11-08 (×2): 160 mg via ORAL
  Filled 2016-11-07 (×2): qty 1

## 2016-11-07 MED ORDER — OXYCODONE HCL 5 MG PO TABS: 5.0000 mg | ORAL_TABLET | Freq: Once | ORAL | Status: DC | PRN

## 2016-11-07 MED ORDER — POTASSIUM CHLORIDE ER 10 MEQ PO TBCR
10.0000 meq | EXTENDED_RELEASE_TABLET | Freq: Once | ORAL | Status: AC
  Administered 2016-11-07: 10 meq via ORAL
  Filled 2016-11-07 (×2): qty 1

## 2016-11-07 MED ORDER — HEPARIN SODIUM (PORCINE) 1000 UNIT/ML IJ SOLN
INTRAMUSCULAR | Status: DC | PRN
Start: 2016-11-07 — End: 2016-11-07
  Administered 2016-11-07: 1000 [IU] via INTRAVENOUS

## 2016-11-07 MED ORDER — FENTANYL CITRATE (PF) 250 MCG/5ML IJ SOLN
INTRAMUSCULAR | Status: DC | PRN
  Administered 2016-11-07: 150 ug via INTRAVENOUS

## 2016-11-07 MED ORDER — SACUBITRIL-VALSARTAN 24-26 MG PO TABS
1.0000 | ORAL_TABLET | Freq: Two times a day (BID) | ORAL | Status: DC
  Administered 2016-11-08: 1 via ORAL
  Filled 2016-11-07 (×2): qty 1

## 2016-11-07 MED ORDER — ACETAMINOPHEN 325 MG PO TABS: 650.0000 mg | ORAL_TABLET | ORAL | Status: DC | PRN

## 2016-11-07 MED ORDER — GUAIFENESIN-CODEINE 100-10 MG/5ML PO SOLN
5.0000 mL | Freq: Four times a day (QID) | ORAL | Status: DC | PRN
  Administered 2016-11-07: 5 mL via ORAL
  Filled 2016-11-07: qty 5

## 2016-11-07 MED ORDER — LIDOCAINE HCL 2 % IJ SOLN
0.1000 mL | Freq: Once | INTRAMUSCULAR | Status: AC
  Administered 2016-11-07: 2 mg via INTRADERMAL
  Filled 2016-11-07: qty 10

## 2016-11-07 MED ORDER — PROTAMINE SULFATE 10 MG/ML IV SOLN
INTRAVENOUS | Status: DC | PRN
  Administered 2016-11-07: 40 mg via INTRAVENOUS

## 2016-11-07 MED ORDER — INSULIN GLARGINE 100 UNIT/ML ~~LOC~~ SOLN
15.0000 [IU] | Freq: Every day | SUBCUTANEOUS | Status: DC
  Administered 2016-11-07: 15 [IU] via SUBCUTANEOUS
  Filled 2016-11-07: qty 0.15

## 2016-11-07 MED ORDER — SODIUM CHLORIDE 0.9 % IV SOLN
INTRAVENOUS | Status: DC
  Administered 2016-11-07: 07:00:00 via INTRAVENOUS

## 2016-11-07 MED ORDER — SODIUM CHLORIDE 0.9% FLUSH: 3.0000 mL | INTRAVENOUS | Status: DC | PRN

## 2016-11-07 MED ORDER — FENTANYL CITRATE (PF) 100 MCG/2ML IJ SOLN: 25.0000 ug | INTRAMUSCULAR | Status: DC | PRN

## 2016-11-07 MED ORDER — FENTANYL CITRATE (PF) 100 MCG/2ML IJ SOLN
25.0000 ug | Freq: Once | INTRAMUSCULAR | Status: AC
  Administered 2016-11-07 (×2): 25 ug via INTRAVENOUS

## 2016-11-07 MED ORDER — OXYCODONE-ACETAMINOPHEN 5-325 MG PO TABS
1.0000 | ORAL_TABLET | Freq: Four times a day (QID) | ORAL | Status: DC | PRN
Start: 2016-11-07 — End: 2016-11-08
  Administered 2016-11-07 – 2016-11-08 (×2): 1 via ORAL
  Filled 2016-11-07 (×2): qty 1

## 2016-11-07 MED ORDER — GLIMEPIRIDE 1 MG PO TABS
2.0000 mg | ORAL_TABLET | Freq: Every day | ORAL | Status: DC
  Administered 2016-11-07: 2 mg via ORAL
  Filled 2016-11-07: qty 2

## 2016-11-07 MED ORDER — ROCURONIUM BROMIDE 10 MG/ML (PF) SYRINGE
PREFILLED_SYRINGE | INTRAVENOUS | Status: DC | PRN
  Administered 2016-11-07: 10 mg via INTRAVENOUS
  Administered 2016-11-07: 50 mg via INTRAVENOUS

## 2016-11-07 MED ORDER — HEPARIN SODIUM (PORCINE) 1000 UNIT/ML IJ SOLN
INTRAMUSCULAR | Status: AC
  Filled 2016-11-07: qty 1

## 2016-11-07 MED ORDER — HEPARIN SODIUM (PORCINE) 1000 UNIT/ML IJ SOLN
INTRAMUSCULAR | Status: DC | PRN
  Administered 2016-11-07: 8000 [IU] via INTRAVENOUS
  Administered 2016-11-07: 14000 [IU] via INTRAVENOUS
  Administered 2016-11-07: 6000 [IU] via INTRAVENOUS

## 2016-11-07 MED ORDER — PROPOFOL 10 MG/ML IV BOLUS
INTRAVENOUS | Status: DC | PRN
  Administered 2016-11-07: 140 mg via INTRAVENOUS

## 2016-11-07 MED ORDER — ONDANSETRON HCL 4 MG/2ML IJ SOLN
INTRAMUSCULAR | Status: DC | PRN
  Administered 2016-11-07: 4 mg via INTRAVENOUS

## 2016-11-07 MED ORDER — METFORMIN HCL 500 MG PO TABS
1000.0000 mg | ORAL_TABLET | Freq: Two times a day (BID) | ORAL | Status: DC
  Administered 2016-11-07 – 2016-11-08 (×2): 1000 mg via ORAL
  Filled 2016-11-07 (×2): qty 2

## 2016-11-07 MED ORDER — SODIUM CHLORIDE 0.9% FLUSH
3.0000 mL | Freq: Two times a day (BID) | INTRAVENOUS | Status: DC
  Administered 2016-11-07 (×2): 3 mL via INTRAVENOUS

## 2016-11-07 MED ORDER — BUPIVACAINE HCL (PF) 0.25 % IJ SOLN
INTRAMUSCULAR | Status: DC | PRN
  Administered 2016-11-07: 35 mL

## 2016-11-07 MED ORDER — GLIMEPIRIDE 1 MG PO TABS
4.0000 mg | ORAL_TABLET | Freq: Every day | ORAL | Status: DC
  Administered 2016-11-08: 4 mg via ORAL
  Filled 2016-11-07: qty 4

## 2016-11-07 MED ORDER — EPHEDRINE SULFATE 50 MG/ML IJ SOLN
INTRAMUSCULAR | Status: DC | PRN
  Administered 2016-11-07: 10 mg via INTRAVENOUS

## 2016-11-07 MED ORDER — INSULIN ASPART 100 UNIT/ML ~~LOC~~ SOLN
0.0000 [IU] | SUBCUTANEOUS | Status: DC
  Administered 2016-11-08: 2 [IU] via SUBCUTANEOUS

## 2016-11-07 MED ORDER — ONDANSETRON HCL 4 MG/2ML IJ SOLN: 4.0000 mg | Freq: Four times a day (QID) | INTRAMUSCULAR | Status: DC | PRN

## 2016-11-07 MED ORDER — LACTATED RINGERS IV SOLN
INTRAVENOUS | Status: DC | PRN
  Administered 2016-11-07: 09:00:00 via INTRAVENOUS

## 2016-11-07 MED ORDER — AMIODARONE HCL 200 MG PO TABS
200.0000 mg | ORAL_TABLET | Freq: Every day | ORAL | Status: DC
  Administered 2016-11-07 – 2016-11-08 (×2): 200 mg via ORAL
  Filled 2016-11-07 (×2): qty 1

## 2016-11-07 MED ORDER — HEPARIN (PORCINE) IN NACL 2-0.9 UNIT/ML-% IJ SOLN
INTRAMUSCULAR | Status: DC | PRN
  Administered 2016-11-07: 09:00:00

## 2016-11-07 MED ORDER — SUGAMMADEX SODIUM 200 MG/2ML IV SOLN
INTRAVENOUS | Status: DC | PRN
  Administered 2016-11-07: 200 mg via INTRAVENOUS

## 2016-11-07 SURGICAL SUPPLY — 19 items
BAG SNAP BAND KOVER 36X36 (MISCELLANEOUS) ×2 IMPLANT
BLANKET WARM UNDERBOD FULL ACC (MISCELLANEOUS) ×2 IMPLANT
CATH SMTCH THERMOCOOL SF DF (CATHETERS) ×1 IMPLANT
CATH SOUNDSTAR 3D IMAGING (CATHETERS) ×1 IMPLANT
CATH VARIABLE LASSO NAV 2515 (CATHETERS) ×1 IMPLANT
CATH WEBSTER BI DIR CS D-F CRV (CATHETERS) ×1 IMPLANT
COVER SWIFTLINK CONNECTOR (BAG) ×2 IMPLANT
NDL TRANSSEPTAL BRK 98CM (NEEDLE) IMPLANT
NEEDLE TRANSSEPTAL BRK 98CM (NEEDLE) ×2 IMPLANT
PACK EP LATEX FREE (CUSTOM PROCEDURE TRAY) ×2
PACK EP LF (CUSTOM PROCEDURE TRAY) ×1 IMPLANT
PAD DEFIB LIFELINK (PAD) ×2 IMPLANT
PATCH CARTO3 (PAD) ×1 IMPLANT
SHEATH AGILIS NXT 8.5F 71CM (SHEATH) ×2 IMPLANT
SHEATH AVANTI 11F 11CM (SHEATH) ×1 IMPLANT
SHEATH PINNACLE 7F 10CM (SHEATH) ×1 IMPLANT
SHEATH PINNACLE 8F 10CM (SHEATH) ×2 IMPLANT
SHEATH PINNACLE 9F 10CM (SHEATH) ×2 IMPLANT
TUBING SMART ABLATE COOLFLOW (TUBING) ×1 IMPLANT

## 2016-11-07 NOTE — H&P (Signed)
Heidi Stark is a 76 y.o. female with a history of atrial fibrillation. She presents for ablation of her AF. She has been complaint with her eliquis. On exam, iRRR, no murmurs, lungs clear. Risks and benefits explained. Risks include but not limited to bleeding, heart block, tamponade, stroke, and damage to surrounding organs. She understands the risks and has agreed to the procedure.  Devean Skoczylas [name], MD 11/07/2016 7:08 AM

## 2016-11-07 NOTE — Progress Notes (Signed)
Called to pts room by pts daughter at 1430, pt c/o pain to right groin and right foot going to sleep, large hematoma noted from right inner groin and thigh spreading to lateral thigh, very painful to the touch, no overt bleeding noted, VSS, Dr [name] notified, cath lab notified,  cath lab personal assisted with hematoma reduction, pressure held to cath site for additional hour with good result, site soft with minimal residual firmness, affected area marked, site with tenderness and  dark bruising, eliquis held per MD, frequent assessments will be performed, new bedrest to start at 1630 X6 hours. Edward Qualia RN

## 2016-11-07 NOTE — Progress Notes (Signed)
Site area: Lt fem venous sheaths 58fr x2 Site Prior to Removal:  Level 0 Pressure Applied For: 53min Manual:   yes Patient Status During Pull:  A/O Post Pull Site:  Level 0 Post Pull Instructions Given:  Instructions given and pt understands Post Pull Pulses Present: 2+ rt pt  Dressing Applied:  Tegaderm applied and a 4x4 Bedrest begins @ 12:30:00 Comments: pt leaves cath lab holding area in stable condition. Jan Johnson-RTR pulled sheaths on left siide.

## 2016-11-07 NOTE — Anesthesia Postprocedure Evaluation (Signed)
Anesthesia Post Note  Patient: Heidi Stark  Procedure(s) Performed: Procedure(s) (LRB): Atrial Fibrillation Ablation (N/A)  Patient location during evaluation: PACU Anesthesia Type: General Level of consciousness: awake and alert and patient cooperative Pain management: pain level controlled Vital Signs Assessment: post-procedure vital signs reviewed and stable Respiratory status: spontaneous breathing and respiratory function stable Cardiovascular status: stable Anesthetic complications: no       Last Vitals:  Vitals:   11/07/16 1310 11/07/16 1331  BP:    Pulse:    Resp:    Temp: 36.3 C 36.6 C    Last Pain:  Vitals:   11/07/16 1331  TempSrc: Oral                 Frank Pilger S

## 2016-11-07 NOTE — Discharge Summary (Signed)
[facility]    ELECTROPHYSIOLOGY PROCEDURE DISCHARGE SUMMARY    Patient ID: Heidi Stark,  MRN: [id number], DOB/AGE: March 05, 1941 76 y.o.  Admit date: 11/07/2016 Discharge date: 11/08/2016   Primary Care Physician: Ann Held, DO  Primary Cardiologist: Dr. [name] Electrophysiologist: Dr. [name]  Primary Discharge Diagnosis:  1. Paroxysmal AFib     CHA2DS2Vasc is 7, on Eliquis  Secondary Discharge Diagnosis:  1. HTN 2. NICM     On Entresto/BB tx 3. Chronic CHF (systolic) 4. DM  Procedures This Admission:  1.  Electrophysiology study and radiofrequency catheter ablation on 11/07/16 by Dr [name].   This study demonstrated: CONCLUSIONS: 1. Sinus rhythm upon presentation.   2. Successful electrical isolation and anatomical encircling of all four pulmonary veins with radiofrequency current. 3. No inducible arrhythmias following ablation both on and off of dobutamine 4. Cardioversion 5. No early apparent complications.  Brief HPI: Heidi Stark is a 76 y.o. female with a history of paroxysmal atrial fibrillation.  They have failed medical therapy with amiodarone. Risks, benefits, and alternatives to catheter ablation of atrial fibrillation were reviewed with the patient who wished to proceed.  The patient underwent cardiac CT prior to the procedure which demonstrated no LAA thrombus.    Hospital Course:  The patient was admitted and underwent EPS/RFCA of atrial fibrillation with details as outlined above.  They were monitored on telemetry overnight which demonstrated sinus rhythm.   Right groin developed hematoma post sheath pull requiring further pressure being held.   It was  stable on the day of discharge.  The patient was examined by Dr. [name] and considered to be stable for discharge.  Wound care and restrictions were reviewed with the patient.  The patient Heidi Stark be seen back by Roderic Palau, NP in 4 weeks and Dr [name] in 12 weeks for post ablation follow up.    Physical  Exam: Vitals:   11/07/16 2300 11/08/16 0255 11/08/16 0500 11/08/16 0720  BP: (!) 131/59 (!) 134/52 (!) 132/51 (!) 118/98  Pulse: 67 71 76 61  Resp: 14 14 18 16   Temp: 97.7 F (36.5 C) 98.5 F (36.9 C) 98.5 F (36.9 C) 98.8 F (37.1 C)  TempSrc: Axillary Oral Oral Oral  SpO2: 100% 95% 95% 99%  Weight:   267 lb 3.2 oz (121.2 kg)   Height:        GEN- The patient is well appearing, alert and oriented x 3 today.   HEENT: normocephalic, atraumatic; sclera clear, conjunctiva pink; hearing intact; oropharynx clear; neck supple  Lungs-  , normal work of breathing.  No wheezes, rales, rhonchi Heart- , no murmurs, rubs or gallops  GI- soft, non-tender, non-distender Extremities- no clubbing, cyanosis, or edema; DP/PT/radial pulses 2+ bilaterally Small soft right groin  hematoma MS- no significant deformity or atrophy Skin- warm and dry, no rash or lesion Psych- euthymic mood, full affect Neuro- strength and sensation are intact   Labs:   Lab Results  Component Value Date   WBC 9.3 10/25/2016   HGB 15.6 (H) 08/21/2016   HCT 45.5 10/25/2016   MCV 88 10/25/2016   PLT 305 10/25/2016   No results for input(s): NA, K, CL, CO2, BUN, CREATININE, CALCIUM, PROT, BILITOT, ALKPHOS, ALT, AST, GLUCOSE in the last 168 hours.  Invalid input(s): LABALBU   Discharge Medications:  Allergies as of 11/08/2016      Reactions   Neomycin-bacitracin Zn-polymyx Rash   Sulfonamide Derivatives Other (See Comments)   Stomach cramps   Ciprofloxacin  Hives, Itching, Rash   Spironolactone Rash      Medication List    TAKE these medications   amiodarone 200 MG tablet Commonly known as:  PACERONE Take 1 tablet (200 mg total) by mouth daily.   apixaban 5 MG Tabs tablet Commonly known as:  ELIQUIS Take 1 tablet (5 mg total) by mouth 2 (two) times daily.   eplerenone 25 MG tablet Commonly known as:  INSPRA Take 1 tablet (25 mg total) by mouth daily.   fenofibrate 160 MG tablet TAKE 1 TABLET (160  MG TOTAL) BY MOUTH DAILY.   furosemide 20 MG tablet Commonly known as:  LASIX TAKE 2 TABLETS IN THE MORNING AND 1 TABLET IN THE EVENING   glimepiride 2 MG tablet Commonly known as:  AMARYL Take 1 tablet (2 mg total) by mouth daily with breakfast. What changed:  how much to take  when to take this  additional instructions   glucose blood test strip Commonly known as:  ONETOUCH VERIO Use as directed once daily to check blood sugar  E11.9   insulin glargine 100 UNIT/ML injection Commonly known as:  LANTUS Inject 0.15 mLs (15 Units total) into the skin at bedtime. 10 units qhs x 30 days What changed:  additional instructions   KLOR-CON 10 10 MEQ tablet Generic drug:  potassium chloride TAKE 1 TABLET (10 MEQ TOTAL) BY MOUTH 2 (TWO) TIMES DAILY.   metFORMIN 1000 MG tablet Commonly known as:  GLUCOPHAGE Take 1 tablet (1,000 mg total) by mouth 2 (two) times daily with a meal.   metoprolol succinate 25 MG 24 hr tablet Commonly known as:  TOPROL-XL Take 1 tablet (25 mg total) by mouth 2 (two) times daily.   polyethylene glycol packet Commonly known as:  MIRALAX Take 17 g by mouth daily as needed for mild constipation.   rosuvastatin 10 MG tablet Commonly known as:  CRESTOR Take 1 tablet (10 mg total) by mouth daily. What changed:  when to take this   sacubitril-valsartan 24-26 MG Commonly known as:  ENTRESTO Take 1 tablet by mouth 2 (two) times daily.       Disposition:   Home Discharge Instructions    Diet - low sodium heart healthy    Complete by:  As directed    Increase activity slowly    Complete by:  As directed      Follow-up Information    [facility] Follow up on 12/06/2016.   Specialty:  Cardiology Why:  2:00PM Contact information: [address] [id number] [place] [phone]       Rochelle Nephew [name], MD Follow up on 02/06/2017.   Specialty:  Cardiology Why:  11:15AM Contact  information: [place] [address] [phone]           Duration of Discharge Encounter: Greater than 30 minutes including physician time.  Signed, Chanetta Marshall, NP 11/08/2016 7:38 AM  I have seen and examined this patient with Chanetta Marshall.  Agree with above, note added to reflect my findings.  On exam, RRR, no murmurs, lungs clear. Presented to the hospital with atrial fibrillation for AF ablation. In sinus rhythm this AM post ablation. Plan for discharge with follow up in AF clinic.    Aneka Fagerstrom M. Adalie Mand MD 11/08/2016 7:40 AM

## 2016-11-07 NOTE — Progress Notes (Signed)
Site area: Rt fem venous sheaths 47fr 56fr Site Prior to Removal:  Level 0 Pressure Applied For:74min Manual: yes    Patient Status During Pull:  A/0 Post Pull Site:  Level 0 Post Pull Instructions Given:  Post instructions given and pt understands Post Pull Pulses Present: 2+ rt pt Dressing Applied:  tegaderm and a 4x4 Bedrest begins @ 12:30:00 Comments: Rt groin unable to obtain hemostasis and additional pressure held until hemostasis achieved. Rt groin site is unremarkable.Pt leaves cath lab holding area in stable condition. Rt groin is unremarlable.

## 2016-11-07 NOTE — Progress Notes (Signed)
Pt. Requested to place dentures. Request honored and pt. Placed dentures. RFV and LFV sites noted without hematoma. Bilateral dressing sites to RFV and LFV intact- no bleeding. Both RFV and LFV sites are soft. Awaiting RN to call back to give report.

## 2016-11-07 NOTE — Anesthesia Preprocedure Evaluation (Signed)
Anesthesia Evaluation  Patient identified by MRN, date of birth, ID band Patient awake    Reviewed: Allergy & Precautions, H&P , NPO status , Patient's Chart, lab work & pertinent test results  Airway Mallampati: II   Neck ROM: full    Dental   Pulmonary sleep apnea , COPD, Current Smoker,    breath sounds clear to auscultation       Cardiovascular hypertension, + Peripheral Vascular Disease and +CHF  + dysrhythmias Atrial Fibrillation  Rhythm:irregular Rate:Normal     Neuro/Psych    GI/Hepatic   Endo/Other  diabetes, Type 2  Renal/GU      Musculoskeletal   Abdominal   Peds  Hematology   Anesthesia Other Findings   Reproductive/Obstetrics                             Anesthesia Physical Anesthesia Plan  ASA: III  Anesthesia Plan: General   Post-op Pain Management:    Induction: Intravenous  Airway Management Planned: Oral ETT  Additional Equipment:   Intra-op Plan:   Post-operative Plan: Extubation in OR  Informed Consent: I have reviewed the patients History and Physical, chart, labs and discussed the procedure including the risks, benefits and alternatives for the proposed anesthesia with the patient or authorized representative who has indicated his/her understanding and acceptance.     Plan Discussed with: CRNA, Anesthesiologist and Surgeon  Anesthesia Plan Comments:         Anesthesia Quick Evaluation

## 2016-11-07 NOTE — Anesthesia Procedure Notes (Signed)
Procedure Name: Intubation Date/Time: 11/07/2016 7:41 AM Performed by: Teressa Lower Pre-anesthesia Checklist: Emergency Drugs available, Patient identified, Suction available, Patient being monitored and Timeout performed Patient Re-evaluated:Patient Re-evaluated prior to inductionOxygen Delivery Method: Circle system utilized Preoxygenation: Pre-oxygenation with 100% oxygen Intubation Type: IV induction Ventilation: Mask ventilation without difficulty Laryngoscope Size: Mac and 4 Grade View: Grade I Tube type: Oral Tube size: 7.5 mm Number of attempts: 1 Placement Confirmation: ETT inserted through vocal cords under direct vision,  positive ETCO2 and breath sounds checked- equal and bilateral Tube secured with: Tape Dental Injury: Teeth and Oropharynx as per pre-operative assessment

## 2016-11-07 NOTE — Transfer of Care (Signed)
Immediate Anesthesia Transfer of Care Note  Patient: Heidi Stark  Procedure(s) Performed: Procedure(s): Atrial Fibrillation Ablation (N/A)  Patient Location: PACU  Anesthesia Type:General  Level of Consciousness: awake, alert  and oriented  Airway & Oxygen Therapy: Patient Spontanous Breathing and Patient connected to nasal cannula oxygen  Post-op Assessment: Report given to RN and Post -op Vital signs reviewed and stable  Post vital signs: Reviewed and stable  Last Vitals:  Vitals:   11/07/16 0550  BP: (!) 154/59  Pulse: 60  Resp: 20  Temp: 36.5 C    Last Pain:  Vitals:   11/07/16 0550  TempSrc: Oral         Complications: No apparent anesthesia complications

## 2016-11-08 DIAGNOSIS — I428 Other cardiomyopathies: Secondary | ICD-10-CM | POA: Diagnosis not present

## 2016-11-08 DIAGNOSIS — I481 Persistent atrial fibrillation: Secondary | ICD-10-CM | POA: Diagnosis not present

## 2016-11-08 DIAGNOSIS — E119 Type 2 diabetes mellitus without complications: Secondary | ICD-10-CM | POA: Diagnosis not present

## 2016-11-08 DIAGNOSIS — I110 Hypertensive heart disease with heart failure: Secondary | ICD-10-CM | POA: Diagnosis not present

## 2016-11-08 LAB — POCT ACTIVATED CLOTTING TIME: ACTIVATED CLOTTING TIME: 296 s

## 2016-11-08 LAB — GLUCOSE, CAPILLARY: GLUCOSE-CAPILLARY: 121 mg/dL — AB (ref 65–99)

## 2016-11-08 NOTE — Progress Notes (Signed)
Discharge order written. Instructions reviewed and given to patient. Pt without questions. Verbalized understanding of instructions and wound care. Pt instructed to call office or return to ED if right groin worsens, voiced understanding. Pt discharged via wheelchair in stable condition.

## 2016-11-14 ENCOUNTER — Telehealth: Payer: Self-pay | Admitting: Cardiology

## 2016-11-14 DIAGNOSIS — Z7689 Persons encountering health services in other specified circumstances: Secondary | ICD-10-CM | POA: Insufficient documentation

## 2016-11-14 NOTE — Telephone Encounter (Signed)
Follow Up:   Please call,pt says she is having excessive swelling in her right leg.Pt had Ablation on 11-07-16.

## 2016-11-14 NOTE — Telephone Encounter (Signed)
Advised to increase Lasix to 80/20 x 3 days & potassium 30/10 x 3 days -- then return to normal daily dosing. Also will order stat venous doppler to r/o DVT. She understands office will call her to arrange testing. Patient verbalized understanding and agreeable to plan.   I will follow up with her within next week to see how she is doing

## 2016-11-14 NOTE — Telephone Encounter (Signed)
Post Afib ablation on 4/24. Pt reports that post procedure she "had a leg that they couldn't get to stop bleeding.  Took 3 1/2 hr to stop".  She "developed a huge hematoma".  Reports area black/blue. Yesterday and today she has been experiencing non pitting right pedal/leg edema.  States leg is sore, but not painful. Denies warmth/heat to leg/foot. Advised that I would forward to Dr. [name] for recommendation/orders.  She understands I will call her back before leaving today.

## 2016-11-16 ENCOUNTER — Ambulatory Visit (HOSPITAL_COMMUNITY)
Admission: RE | Admit: 2016-11-16 | Discharge: 2016-11-16 | Disposition: A | Discharge status: Home / Self Care | Payer: Medicare Other | Source: Ambulatory Visit | Attending: Cardiology | Admitting: Cardiology

## 2016-11-16 ENCOUNTER — Telehealth: Payer: Self-pay | Admitting: Cardiology

## 2016-11-16 DIAGNOSIS — M7989 Other specified soft tissue disorders: Secondary | ICD-10-CM | POA: Diagnosis not present

## 2016-11-16 DIAGNOSIS — M79609 Pain in unspecified limb: Secondary | ICD-10-CM

## 2016-11-16 DIAGNOSIS — Z7689 Persons encountering health services in other specified circumstances: Secondary | ICD-10-CM

## 2016-11-16 NOTE — Telephone Encounter (Signed)
Follow UP:   Please call,said she had talked to you on Tuesday about her having a possible blood clot.She wants you to know that her condition still exist.She said she was waiting to get a doppler scheduled.Marland Kitchen

## 2016-11-16 NOTE — Telephone Encounter (Signed)
Pt scheduled for today at noon at our [facility] office.

## 2016-11-16 NOTE — Telephone Encounter (Signed)
Apologized to patient for the delay in getting this concern/issue addressed. Informed pt scheduling supervisor working on this and she will receive a phone call very soon to arrange this test. Patient verbalized understanding and agreeable to plan.

## 2016-11-20 ENCOUNTER — Encounter: Payer: Self-pay | Admitting: *Deleted

## 2016-11-20 MED ORDER — APIXABAN 5 MG PO TABS: 5.0000 mg | ORAL_TABLET | Freq: Two times a day (BID) | ORAL | 3 refills | Status: DC

## 2016-11-20 NOTE — Addendum Note (Signed)
Addended by: Stanton Kidney on: 11/20/2016 10:13 AM   Modules accepted: Orders

## 2016-11-20 NOTE — Telephone Encounter (Signed)
This encounter was created in error - please disregard.

## 2016-11-20 NOTE — Telephone Encounter (Signed)
Advised pt to continue to monitor and that it may take some time for swelling to improve. Advised to call if swelling hasn't improved by next week/elevate leg/call office if worsens before next week. Patient verbalized understanding and agreeable to plan.

## 2016-11-20 NOTE — Telephone Encounter (Signed)
Called pt to inform her that her doppler was negative for DVT. She reports swelling still persisting in right leg, from the knee down.  Swelling improved in right upper leg.  States that when she first gets up it "feels like skin is pulling", but "improves slightly after moving around".   Will forward to Dr. [name] for further advisement.  (will send Eliquis refills, per pt request)

## 2016-11-21 ENCOUNTER — Telehealth: Payer: Self-pay | Admitting: Cardiology

## 2016-11-21 ENCOUNTER — Encounter: Payer: Self-pay | Admitting: Family Medicine

## 2016-11-21 ENCOUNTER — Encounter (HOSPITAL_COMMUNITY): Payer: Self-pay

## 2016-11-21 ENCOUNTER — Ambulatory Visit (INDEPENDENT_AMBULATORY_CARE_PROVIDER_SITE_OTHER): Payer: Medicare Other | Admitting: Family Medicine

## 2016-11-21 VITALS — BP 108/60 | HR 74 | Temp 97.9°F | Resp 16 | Ht 66.0 in | Wt 257.8 lb

## 2016-11-21 DIAGNOSIS — L03115 Cellulitis of right lower limb: Secondary | ICD-10-CM | POA: Diagnosis not present

## 2016-11-21 MED ORDER — CEFTRIAXONE SODIUM 1 G IJ SOLR
1.0000 g | Freq: Once | INTRAMUSCULAR | Status: AC
  Administered 2016-11-21: 1 g via INTRAMUSCULAR

## 2016-11-21 MED ORDER — CEPHALEXIN 500 MG PO CAPS: 500.0000 mg | ORAL_CAPSULE | Freq: Two times a day (BID) | ORAL | 0 refills | Status: DC

## 2016-11-21 NOTE — Telephone Encounter (Signed)
New Message    Per daughter   Her leg is swollen and red , she thinks its cellulitis , whole lower leg is swollen , very painful.

## 2016-11-21 NOTE — Patient Instructions (Signed)

## 2016-11-21 NOTE — Telephone Encounter (Signed)
Advised pt to f/u w/ PCP.  She states she will attempt getting help through their office.

## 2016-11-21 NOTE — Progress Notes (Signed)
Patient ID: Heidi Stark, female   DOB: [birth year], 76 y.o.   MRN: [id number]     Subjective:  I acted as a Education administrator for Dr. [name].  Guerry Bruin, [place]   Patient ID: Heidi Stark, female    DOB: [birth year], 76 y.o.   MRN: [id number]  Chief Complaint  Patient presents with  . Leg Pain    right leg pain.  had ablation surgery and bleeding would not stop and developed hemotoma and the pressed on it about 2 hours and redness and bruising runnning down leg    HPI  Patient is in today for right leg pain. Had ablation surgery on 11/07/16 and bleeding would not stop and developed hemotoma and they pressed on it for about 2 hours to stop the bleeding and now she has  redness and bruising runnning down right leg.-- reddness started 2-3 days ago.  Doppler was done at hospital when swelling started and was Neg for DVT  Patient Care Team: [name], [name], DO as PCP - General Luberta Mutter, MD as Consulting Physician (Ophthalmology) Jacelyn Pi, MD as Consulting Physician (Endocrinology)   Past Medical History:  Diagnosis Date  . Arthritis    "hands, wrists" (11/07/2016)  . CHF (congestive heart failure) ([place])   . Chronic lower back pain   . COPD (chronic obstructive pulmonary disease) ([place])   . DVT (deep venous thrombosis) ([place])   . History of blood transfusion 1990   "related to OR"  . History of kidney stones   . Hyperlipidemia   . Hypertension   . On home oxygen therapy    "2L w/CPAP when I sleep" (11/07/2016)  . OSA on CPAP   . PAF (paroxysmal atrial fibrillation) ([place])   . Pneumonia    "several times" (11/07/2016)  . Type II diabetes mellitus ([place])     Past Surgical History:  Procedure Laterality Date  . ATRIAL FIBRILLATION ABLATION N/A 11/07/2016   Procedure: Atrial Fibrillation Ablation;  Surgeon: [name], MD;  Location: [facility];  Service: Cardiovascular;  Laterality: N/A;  . BACK SURGERY    . CARDIAC CATHETERIZATION    . CARDIOVERSION N/A  08/11/2016   Procedure: CARDIOVERSION;  Surgeon: Larey Dresser, MD;  Location: [facility];  Service: Cardiovascular;  Laterality: N/A;  . CATARACT EXTRACTION W/ INTRAOCULAR LENS  IMPLANT, BILATERAL Bilateral   . COLON SURGERY  1990   vein graft and colon repair after nicked artery with back surgert  . COLONOSCOPY    . CYSTECTOMY     between bladder and kidneys  . GANGLION CYST EXCISION Left   . KNEE CARTILAGE SURGERY Right 1960s  . [place] SURGERY  1990  . REPAIR ILIAC ARTERY  1990   vein graft and colon repair after nicked artery with back surgert  . TEE WITHOUT CARDIOVERSION N/A 10/31/2013   Procedure: TRANSESOPHAGEAL ECHOCARDIOGRAM (TEE);  Surgeon: Larey Dresser, MD;  Location: [facility];  Service: Cardiovascular;  Laterality: N/A;  . TUBAL LIGATION      Family History  Problem Relation Age of Onset  . Diabetes    . Melanoma    . Factor V Leiden deficiency Daughter     Social History   Social History  . Marital status: Married    Spouse name: N/A  . Number of children: 2  . Years of education: N/A   Occupational History  . Retired    Social History Main Topics  . Smoking status: Former Smoker  Packs/day: 1.00    Years: 45.00    Types: Cigarettes    Quit date: 10/15/2016  . Smokeless tobacco: Never Used  . Alcohol use No  . Drug use: No  . Sexual activity: Not on file   Other Topics Concern  . Not on file   Social History Narrative  . No narrative on file    Outpatient Medications Prior to Visit  Medication Sig Dispense Refill  . amiodarone (PACERONE) 200 MG tablet Take 1 tablet (200 mg total) by mouth daily. 30 tablet 3  . apixaban (ELIQUIS) 5 MG TABS tablet Take 1 tablet (5 mg total) by mouth 2 (two) times daily. 180 tablet 3  . eplerenone (INSPRA) 25 MG tablet Take 1 tablet (25 mg total) by mouth daily. 30 tablet 3  . fenofibrate 160 MG tablet TAKE 1 TABLET (160 MG TOTAL) BY MOUTH DAILY. 90 tablet 1  . furosemide (LASIX) 20 MG tablet TAKE 2  TABLETS IN THE MORNING AND 1 TABLET IN THE EVENING 90 tablet 1  . glimepiride (AMARYL) 2 MG tablet Take 1 tablet (2 mg total) by mouth daily with breakfast. (Patient taking differently: Take 2-4 mg by mouth See admin instructions. Takes 2 tabs in am and 1 tab in pm)    . glucose blood (ONETOUCH VERIO) test strip Use as directed once daily to check blood sugar  E11.9 100 each 6  . insulin glargine (LANTUS) 100 UNIT/ML injection Inject 0.15 mLs (15 Units total) into the skin at bedtime. 10 units qhs x 30 days (Patient taking differently: Inject 15 Units into the skin at bedtime. ) 10 mL   . KLOR-CON 10 10 MEQ tablet TAKE 1 TABLET (10 MEQ TOTAL) BY MOUTH 2 (TWO) TIMES DAILY. 180 tablet 0  . metFORMIN (GLUCOPHAGE) 1000 MG tablet Take 1 tablet (1,000 mg total) by mouth 2 (two) times daily with a meal.    . metoprolol succinate (TOPROL-XL) 25 MG 24 hr tablet Take 1 tablet (25 mg total) by mouth 2 (two) times daily. 60 tablet 6  . polyethylene glycol (MIRALAX) packet Take 17 g by mouth daily as needed for mild constipation. 14 each 0  . rosuvastatin (CRESTOR) 10 MG tablet Take 1 tablet (10 mg total) by mouth daily. (Patient taking differently: Take 10 mg by mouth at bedtime. )    . sacubitril-valsartan (ENTRESTO) 24-26 MG Take 1 tablet by mouth 2 (two) times daily. 60 tablet 3   No facility-administered medications prior to visit.     Allergies  Allergen Reactions  . Neomycin-Bacitracin Zn-Polymyx Rash  . Sulfonamide Derivatives Other (See Comments)    Stomach cramps  . Ciprofloxacin Hives, Itching and Rash  . Spironolactone Rash    Review of Systems  Constitutional: Negative for fever and malaise/fatigue.  HENT: Negative for congestion.   Eyes: Negative for blurred vision.  Respiratory: Negative for cough and shortness of breath.   Cardiovascular: Positive for leg swelling. Negative for chest pain and palpitations.       Right leg  Gastrointestinal: Negative for vomiting.  Musculoskeletal:  Negative for back pain.  Skin: Negative for rash.       Redness left leg   Neurological: Negative for loss of consciousness and headaches.  Endo/Heme/Allergies: Bruises/bleeds easily.       Bruising right leg       Objective:    Physical Exam  Constitutional: She is oriented to person, place, and time. She appears well-developed and well-nourished. No distress.  HENT:  Head: Normocephalic  and atraumatic.  Eyes: Conjunctivae are normal.  Neck: Normal range of motion. No thyromegaly present.  Cardiovascular: Normal rate and regular rhythm.   Pulmonary/Chest: Effort normal and breath sounds normal. She has no wheezes.  Abdominal: Soft. Bowel sounds are normal. There is no tenderness.  Musculoskeletal: Normal range of motion. She exhibits edema and tenderness. She exhibits no deformity.  Neurological: She is alert and oriented to person, place, and time.  Skin: Skin is warm. She is not diaphoretic. There is erythema.     Psychiatric: She has a normal mood and affect.        BP 108/60 (BP Location: Left Arm, Cuff Size: Large)   Pulse 74   Temp 97.9 F (36.6 C) (Oral)   Resp 16   Ht 5\' 6"  (1.676 m)   Wt 257 lb 12.8 oz (116.9 kg)   SpO2 94%   BMI 41.61 kg/m  Wt Readings from Last 3 Encounters:  11/21/16 257 lb 12.8 oz (116.9 kg)  11/08/16 267 lb 3.2 oz (121.2 kg)  11/01/16 256 lb (116.1 kg)   BP Readings from Last 3 Encounters:  11/21/16 108/60  11/08/16 (!) 118/98  11/01/16 140/78     Immunization History  Administered Date(s) Administered  . Influenza Whole 04/28/2009, 04/27/2010  . Influenza, High Dose Seasonal PF 04/24/2014  . Influenza-Unspecified 04/17/2015  . Pneumococcal Conjugate-13 10/11/2015  . Pneumococcal Polysaccharide-23 07/17/2002, 11/29/2011  . Td 07/17/2002  . Zoster 11/11/2008    Health Maintenance  Topic Date Due  . DEXA SCAN  01/07/2007  . COLONOSCOPY  12/12/2011  . TETANUS/TDAP  07/17/2012  . MAMMOGRAM  11/28/2012  . URINE  MICROALBUMIN  11/30/2015  . HEMOGLOBIN A1C  04/12/2016  . FOOT EXAM  07/06/2016  . OPHTHALMOLOGY EXAM  08/09/2016  . INFLUENZA VACCINE  05/18/2017 (Originally 02/14/2017)  . PNA vac Low Risk Adult  Completed    Lab Results  Component Value Date   WBC 9.3 10/25/2016   HGB 15.6 (H) 08/21/2016   HCT 45.5 10/25/2016   PLT 305 10/25/2016   GLUCOSE 64 (L) 10/25/2016   CHOL 112 10/11/2015   TRIG 187.0 (H) 10/11/2015   HDL 28.90 (L) 10/11/2015   LDLDIRECT 42.0 11/30/2014   LDLCALC 46 10/11/2015   ALT 30 08/21/2016   AST 36 08/21/2016   NA 144 10/25/2016   K 4.2 10/25/2016   CL 98 10/25/2016   CREATININE 0.86 10/25/2016   BUN 19 10/25/2016   CO2 29 10/25/2016   TSH 5.862 (H) 08/31/2016   INR 2.7 03/09/2014   HGBA1C 7.7 (H) 10/11/2015   MICROALBUR 9.3 (H) 11/30/2014    Lab Results  Component Value Date   TSH 5.862 (H) 08/31/2016   Lab Results  Component Value Date   WBC 9.3 10/25/2016   HGB 15.6 (H) 08/21/2016   HCT 45.5 10/25/2016   MCV 88 10/25/2016   PLT 305 10/25/2016   Lab Results  Component Value Date   NA 144 10/25/2016   K 4.2 10/25/2016   CO2 29 10/25/2016   GLUCOSE 64 (L) 10/25/2016   BUN 19 10/25/2016   CREATININE 0.86 10/25/2016   BILITOT 0.8 08/21/2016   ALKPHOS 52 08/21/2016   AST 36 08/21/2016   ALT 30 08/21/2016   PROT 6.7 08/21/2016   ALBUMIN 3.6 08/21/2016   CALCIUM 9.1 10/25/2016   ANIONGAP 11 09/28/2016   GFR 112.55 10/11/2015   Lab Results  Component Value Date   CHOL 112 10/11/2015   Lab Results  Component Value  Date   HDL 28.90 (L) 10/11/2015   Lab Results  Component Value Date   LDLCALC 46 10/11/2015   Lab Results  Component Value Date   TRIG 187.0 (H) 10/11/2015   Lab Results  Component Value Date   CHOLHDL 4 10/11/2015   Lab Results  Component Value Date   HGBA1C 7.7 (H) 10/11/2015         Assessment & Plan:   Problem List Items Addressed This Visit    None    Visit Diagnoses    Cellulitis of right lower  extremity    -  Primary   Relevant Medications   cephALEXin (KEFLEX) 500 MG capsule   cefTRIAXone (ROCEPHIN) injection 1 g (Completed)      I am having Ms. Heideman start on cephALEXin. I am also having her maintain her rosuvastatin, metFORMIN, insulin glargine, glimepiride, fenofibrate, polyethylene glycol, sacubitril-valsartan, amiodarone, KLOR-CON 10, metoprolol succinate, eplerenone, furosemide, glucose blood, and apixaban. We administered cefTRIAXone.  Meds ordered this encounter  Medications  . cephALEXin (KEFLEX) 500 MG capsule    Sig: Take 1 capsule (500 mg total) by mouth 2 (two) times daily.    Dispense:  20 capsule    Refill:  0  . cefTRIAXone (ROCEPHIN) injection 1 g    CMA served as scribe during this visit. History, Physical and Plan performed by medical provider. Documentation and orders reviewed and attested to.  Ann Held, DO

## 2016-11-21 NOTE — Progress Notes (Signed)
Pre visit review using our clinic review tool, if applicable. No additional management support is needed unless otherwise documented below in the visit note. 

## 2016-11-21 NOTE — Telephone Encounter (Signed)
Patient to follow up with PCP if concerned about cellulitis.

## 2016-11-22 ENCOUNTER — Other Ambulatory Visit: Payer: Medicare Other

## 2016-11-22 ENCOUNTER — Encounter: Payer: Self-pay | Admitting: Family Medicine

## 2016-11-23 ENCOUNTER — Encounter: Payer: Self-pay | Admitting: Family Medicine

## 2016-11-23 ENCOUNTER — Telehealth: Payer: Self-pay | Admitting: Family Medicine

## 2016-11-23 ENCOUNTER — Ambulatory Visit: Payer: Medicare Other | Admitting: Family Medicine

## 2016-11-23 ENCOUNTER — Ambulatory Visit (INDEPENDENT_AMBULATORY_CARE_PROVIDER_SITE_OTHER): Payer: Medicare Other | Admitting: Family Medicine

## 2016-11-23 VITALS — BP 126/68 | HR 63 | Temp 98.1°F | Resp 16

## 2016-11-23 DIAGNOSIS — L03115 Cellulitis of right lower limb: Secondary | ICD-10-CM

## 2016-11-23 MED ORDER — CEFTRIAXONE SODIUM 1 G IJ SOLR
1.0000 g | Freq: Once | INTRAMUSCULAR | Status: AC
  Administered 2016-11-23: 1 g via INTRAMUSCULAR

## 2016-11-23 MED ORDER — DOXYCYCLINE HYCLATE 100 MG PO TABS: 100.0000 mg | ORAL_TABLET | Freq: Two times a day (BID) | ORAL | 0 refills | Status: DC

## 2016-11-23 NOTE — Telephone Encounter (Signed)
Error

## 2016-11-23 NOTE — Progress Notes (Signed)
Patient ID: Heidi Stark, female   DOB: 05-02-1941, 76 y.o.   MRN: [id number]     Subjective:  I acted as a Education administrator for Dr. [name].  Guerry Bruin, [place]   Patient ID: Heidi Stark, female    DOB: 09-Jun-1941, 76 y.o.   MRN: [id number]  Chief Complaint  Patient presents with  . Cellulitis    right lower leg    HPI  Patient is in today for follow up on cellulitis on her right lower leg.  The bruising is better.  She cannot hardly walk today.  She is in pain.  She has redness on lower leg and foot.  Patient Care Team: [name], [name], DO as PCP - General Luberta Mutter, MD as Consulting Physician (Ophthalmology) Jacelyn Pi, MD as Consulting Physician (Endocrinology)   Past Medical History:  Diagnosis Date  . Arthritis    "hands, wrists" (11/07/2016)  . CHF (congestive heart failure) ([place])   . Chronic lower back pain   . COPD (chronic obstructive pulmonary disease) ([place])   . DVT (deep venous thrombosis) ([place])   . History of blood transfusion 1990   "related to OR"  . History of kidney stones   . Hyperlipidemia   . Hypertension   . On home oxygen therapy    "2L w/CPAP when I sleep" (11/07/2016)  . OSA on CPAP   . PAF (paroxysmal atrial fibrillation) ([place])   . Pneumonia    "several times" (11/07/2016)  . Type II diabetes mellitus ([place])     Past Surgical History:  Procedure Laterality Date  . ATRIAL FIBRILLATION ABLATION N/A 11/07/2016   Procedure: Atrial Fibrillation Ablation;  Surgeon: [name], MD;  Location: [facility];  Service: Cardiovascular;  Laterality: N/A;  . BACK SURGERY    . CARDIAC CATHETERIZATION    . CARDIOVERSION N/A 08/11/2016   Procedure: CARDIOVERSION;  Surgeon: Larey Dresser, MD;  Location: [facility];  Service: Cardiovascular;  Laterality: N/A;  . CATARACT EXTRACTION W/ INTRAOCULAR LENS  IMPLANT, BILATERAL Bilateral   . COLON SURGERY  1990   vein graft and colon repair after nicked artery with back surgert  .  COLONOSCOPY    . CYSTECTOMY     between bladder and kidneys  . GANGLION CYST EXCISION Left   . KNEE CARTILAGE SURGERY Right 1960s  . [place] SURGERY  1990  . REPAIR ILIAC ARTERY  1990   vein graft and colon repair after nicked artery with back surgert  . TEE WITHOUT CARDIOVERSION N/A 10/31/2013   Procedure: TRANSESOPHAGEAL ECHOCARDIOGRAM (TEE);  Surgeon: Larey Dresser, MD;  [facility];  Service: Cardiovascular;  Laterality: N/A;  . TUBAL LIGATION      Family History  Problem Relation Age of Onset  . Diabetes Unknown   . Melanoma Unknown   . Factor V Leiden deficiency Daughter     Social History   Social History  . Marital status: Married    Spouse name: N/A  . Number of children: 2  . Years of education: N/A   Occupational History  . Retired    Social History Main Topics  . Smoking status: Former Smoker    Packs/day: 1.00    Years: 45.00    Types: Cigarettes    Quit date: 10/15/2016  . Smokeless tobacco: Never Used  . Alcohol use No  . Drug use: No  . Sexual activity: Not on file   Other Topics Concern  . Not on file  Social History Narrative  . No narrative on file    Outpatient Medications Prior to Visit  Medication Sig Dispense Refill  . amiodarone (PACERONE) 200 MG tablet Take 1 tablet (200 mg total) by mouth daily. 30 tablet 3  . apixaban (ELIQUIS) 5 MG TABS tablet Take 1 tablet (5 mg total) by mouth 2 (two) times daily. 180 tablet 3  . eplerenone (INSPRA) 25 MG tablet Take 1 tablet (25 mg total) by mouth daily. 30 tablet 3  . fenofibrate 160 MG tablet TAKE 1 TABLET (160 MG TOTAL) BY MOUTH DAILY. 90 tablet 1  . furosemide (LASIX) 20 MG tablet TAKE 2 TABLETS IN THE MORNING AND 1 TABLET IN THE EVENING 90 tablet 1  . glimepiride (AMARYL) 2 MG tablet Take 1 tablet (2 mg total) by mouth daily with breakfast. (Patient taking differently: Take 2-4 mg by mouth See admin instructions. Takes 2 tabs in am and 1 tab in pm)    . glucose blood (ONETOUCH  VERIO) test strip Use as directed once daily to check blood sugar  E11.9 100 each 6  . insulin glargine (LANTUS) 100 UNIT/ML injection Inject 0.15 mLs (15 Units total) into the skin at bedtime. 10 units qhs x 30 days (Patient taking differently: Inject 15 Units into the skin at bedtime. ) 10 mL   . KLOR-CON 10 10 MEQ tablet TAKE 1 TABLET (10 MEQ TOTAL) BY MOUTH 2 (TWO) TIMES DAILY. 180 tablet 0  . metFORMIN (GLUCOPHAGE) 1000 MG tablet Take 1 tablet (1,000 mg total) by mouth 2 (two) times daily with a meal.    . metoprolol succinate (TOPROL-XL) 25 MG 24 hr tablet Take 1 tablet (25 mg total) by mouth 2 (two) times daily. 60 tablet 6  . polyethylene glycol (MIRALAX) packet Take 17 g by mouth daily as needed for mild constipation. 14 each 0  . rosuvastatin (CRESTOR) 10 MG tablet Take 1 tablet (10 mg total) by mouth daily. (Patient taking differently: Take 10 mg by mouth at bedtime. )    . sacubitril-valsartan (ENTRESTO) 24-26 MG Take 1 tablet by mouth 2 (two) times daily. 60 tablet 3  . cephALEXin (KEFLEX) 500 MG capsule Take 1 capsule (500 mg total) by mouth 2 (two) times daily. 20 capsule 0   No facility-administered medications prior to visit.     Allergies  Allergen Reactions  . Neomycin-Bacitracin Zn-Polymyx Rash  . Sulfonamide Derivatives Other (See Comments)    Stomach cramps  . Ciprofloxacin Hives, Itching and Rash  . Spironolactone Rash    Review of Systems  Constitutional: Negative for fever and malaise/fatigue.  HENT: Negative for congestion.   Eyes: Negative for blurred vision.  Respiratory: Negative for cough and shortness of breath.   Cardiovascular: Negative for chest pain, palpitations and leg swelling.  Gastrointestinal: Negative for vomiting.  Musculoskeletal: Negative for back pain.  Skin:       Redness lower right leg and foot.  Neurological: Negative for loss of consciousness and headaches.       Objective:    Physical Exam  Constitutional: She is oriented to  person, place, and time. She appears well-developed and well-nourished. No distress.  HENT:  Head: Normocephalic and atraumatic.  Eyes: Conjunctivae are normal.  Neck: Normal range of motion. No thyromegaly present.  Cardiovascular: Normal rate and regular rhythm.   Pulmonary/Chest: Effort normal and breath sounds normal. She has no wheezes.  Abdominal: Soft. Bowel sounds are normal. There is no tenderness.  Musculoskeletal: Normal range of motion. She exhibits  no edema or deformity.  Neurological: She is alert and oriented to person, place, and time.  Skin: Skin is warm and dry. Ecchymosis noted. She is not diaphoretic. There is erythema.     Psychiatric: She has a normal mood and affect.    BP 126/68 (BP Location: Right Arm, Cuff Size: Large)   Pulse 63   Temp 98.1 F (36.7 C) (Oral)   Resp 16   SpO2 93%  Wt Readings from Last 3 Encounters:  11/21/16 257 lb 12.8 oz (116.9 kg)  11/08/16 267 lb 3.2 oz (121.2 kg)  11/01/16 256 lb (116.1 kg)   BP Readings from Last 3 Encounters:  11/23/16 126/68  11/21/16 108/60  11/08/16 (!) 118/98     Immunization History  Administered Date(s) Administered  . Influenza Whole 04/28/2009, 04/27/2010  . Influenza, High Dose Seasonal PF 04/24/2014  . Influenza-Unspecified 04/17/2015  . Pneumococcal Conjugate-13 10/11/2015  . Pneumococcal Polysaccharide-23 07/17/2002, 11/29/2011  . Td 07/17/2002  . Zoster 11/11/2008    Health Maintenance  Topic Date Due  . DEXA SCAN  01/07/2007  . COLONOSCOPY  12/12/2011  . TETANUS/TDAP  07/17/2012  . MAMMOGRAM  11/28/2012  . URINE MICROALBUMIN  11/30/2015  . HEMOGLOBIN A1C  04/12/2016  . FOOT EXAM  07/06/2016  . OPHTHALMOLOGY EXAM  08/09/2016  . INFLUENZA VACCINE  05/18/2017 (Originally 02/14/2017)  . PNA vac Low Risk Adult  Completed    Lab Results  Component Value Date   WBC 9.3 10/25/2016   HGB 15.6 (H) 08/21/2016   HCT 45.5 10/25/2016   PLT 305 10/25/2016   GLUCOSE 64 (L) 10/25/2016    CHOL 112 10/11/2015   TRIG 187.0 (H) 10/11/2015   HDL 28.90 (L) 10/11/2015   LDLDIRECT 42.0 11/30/2014   LDLCALC 46 10/11/2015   ALT 30 08/21/2016   AST 36 08/21/2016   NA 144 10/25/2016   K 4.2 10/25/2016   CL 98 10/25/2016   CREATININE 0.86 10/25/2016   BUN 19 10/25/2016   CO2 29 10/25/2016   TSH 5.862 (H) 08/31/2016   INR 2.7 03/09/2014   HGBA1C 7.7 (H) 10/11/2015   MICROALBUR 9.3 (H) 11/30/2014    Lab Results  Component Value Date   TSH 5.862 (H) 08/31/2016   Lab Results  Component Value Date   WBC 9.3 10/25/2016   HGB 15.6 (H) 08/21/2016   HCT 45.5 10/25/2016   MCV 88 10/25/2016   PLT 305 10/25/2016   Lab Results  Component Value Date   NA 144 10/25/2016   K 4.2 10/25/2016   CO2 29 10/25/2016   GLUCOSE 64 (L) 10/25/2016   BUN 19 10/25/2016   CREATININE 0.86 10/25/2016   BILITOT 0.8 08/21/2016   ALKPHOS 52 08/21/2016   AST 36 08/21/2016   ALT 30 08/21/2016   PROT 6.7 08/21/2016   ALBUMIN 3.6 08/21/2016   CALCIUM 9.1 10/25/2016   ANIONGAP 11 09/28/2016   GFR 112.55 10/11/2015   Lab Results  Component Value Date   CHOL 112 10/11/2015   Lab Results  Component Value Date   HDL 28.90 (L) 10/11/2015   Lab Results  Component Value Date   LDLCALC 46 10/11/2015   Lab Results  Component Value Date   TRIG 187.0 (H) 10/11/2015   Lab Results  Component Value Date   CHOLHDL 4 10/11/2015   Lab Results  Component Value Date   HGBA1C 7.7 (H) 10/11/2015         Assessment & Plan:   Problem List Items Addressed This  Visit    None    Visit Diagnoses    Cellulitis of right lower extremity    -  Primary   Relevant Medications   cefTRIAXone (ROCEPHIN) injection 1 g (Completed)   cefTRIAXone (ROCEPHIN) injection 1 g (Completed)   doxycycline (VIBRA-TABS) 100 MG tablet    elevate leg f/u Monday or sooner prn  D/c keflex and start doxycyline   I have discontinued Ms. Bega's cephALEXin. I am also having her start on doxycycline. Additionally, I  am having her maintain her rosuvastatin, metFORMIN, insulin glargine, glimepiride, fenofibrate, polyethylene glycol, sacubitril-valsartan, amiodarone, KLOR-CON 10, metoprolol succinate, eplerenone, furosemide, glucose blood, and apixaban. We administered cefTRIAXone and cefTRIAXone.  Meds ordered this encounter  Medications  . cefTRIAXone (ROCEPHIN) injection 1 g  . cefTRIAXone (ROCEPHIN) injection 1 g  . doxycycline (VIBRA-TABS) 100 MG tablet    Sig: Take 1 tablet (100 mg total) by mouth 2 (two) times daily.    Dispense:  20 tablet    Refill:  0    CMA served as scribe during this visit. History, Physical and Plan performed by medical provider. Documentation and orders reviewed and attested to.  Ann Held, DO

## 2016-11-23 NOTE — Patient Instructions (Signed)

## 2016-11-23 NOTE — Progress Notes (Signed)
Pre visit review using our clinic review tool, if applicable. No additional management support is needed unless otherwise documented below in the visit note. 

## 2016-11-24 ENCOUNTER — Telehealth (HOSPITAL_COMMUNITY): Payer: Self-pay | Admitting: Pharmacist

## 2016-11-24 NOTE — Telephone Encounter (Signed)
Heidi Stark states that she is now in the "donut hole" so her Eliquis is $450/90 day and $158/30 day supply which she cannot afford. I have advised her that she may be eligible for [facility] patient assistance as long as she has spent 3% of her annual income on medications for the year. I have asked her to bring a pharmacy print out with this information with her to her next afib clinic visit on 5/23 at which time I will also have her fill out the application. She verbalized understanding and will bring the information with her at that time.   Ruta Hinds. Velva Harman, PharmD, BCPS, CPP Clinical Pharmacist Pager: [phone] Phone: [phone] 11/24/2016 2:27 PM

## 2016-11-27 ENCOUNTER — Ambulatory Visit (INDEPENDENT_AMBULATORY_CARE_PROVIDER_SITE_OTHER): Payer: Medicare Other | Admitting: Family Medicine

## 2016-11-27 ENCOUNTER — Encounter: Payer: Self-pay | Admitting: Family Medicine

## 2016-11-27 VITALS — BP 136/66 | Temp 98.4°F | Ht 66.0 in | Wt 258.0 lb

## 2016-11-27 DIAGNOSIS — M79606 Pain in leg, unspecified: Secondary | ICD-10-CM | POA: Diagnosis not present

## 2016-11-27 DIAGNOSIS — L03115 Cellulitis of right lower limb: Secondary | ICD-10-CM | POA: Diagnosis not present

## 2016-11-27 MED ORDER — AMOXICILLIN-POT CLAVULANATE 875-125 MG PO TABS: 1.0000 | ORAL_TABLET | Freq: Two times a day (BID) | ORAL | 0 refills | Status: DC

## 2016-11-27 MED ORDER — CEFTRIAXONE SODIUM 500 MG IJ SOLR
500.0000 mg | Freq: Once | INTRAMUSCULAR | Status: AC
  Administered 2016-11-27: 500 mg via INTRAMUSCULAR

## 2016-11-27 NOTE — Patient Instructions (Signed)
Cellulitis, Adult Cellulitis is a skin infection. The infected area is usually red and sore. This condition occurs most often in the arms and lower legs. It is very important to get treated for this condition. Follow these instructions at home:  Take over-the-counter and prescription medicines only as told by your doctor.  If you were prescribed an antibiotic medicine, take it as told by your doctor. Do not stop taking the antibiotic even if you start to feel better.  Drink enough fluid to keep your pee (urine) clear or pale yellow.  Do not touch or rub the infected area.  Raise (elevate) the infected area above the level of your heart while you are sitting or lying down.  Place warm or cold wet cloths (warm or cold compresses) on the infected area. Do this as told by your doctor.  Keep all follow-up visits as told by your doctor. This is important. These visits let your doctor make sure your infection is not getting worse. Contact a doctor if:  You have a fever.  Your symptoms do not get better after 1-2 days of treatment.  Your bone or joint under the infected area starts to hurt after the skin has healed.  Your infection comes back. This can happen in the same area or another area.  You have a swollen bump in the infected area.  You have new symptoms.  You feel ill and also have muscle aches and pains. Get help right away if:  Your symptoms get worse.  You feel very sleepy.  You throw up (vomit) or have watery poop (diarrhea) for a long time.  There are red streaks coming from the infected area.  Your red area gets larger.  Your red area turns darker. This information is not intended to replace advice given to you by your health care provider. Make sure you discuss any questions you have with your health care provider. Document Released: 12/20/2007 Document Revised: 12/09/2015 Document Reviewed: 05/12/2015 Elsevier Interactive Patient Education  2017 [employer]  

## 2016-11-27 NOTE — Progress Notes (Signed)
Pre visit review using our clinic review tool, if applicable. No additional management support is needed unless otherwise documented below in the visit note. 

## 2016-11-27 NOTE — Progress Notes (Signed)
Patient ID: Heidi Stark, female   DOB: 12-19-1940, 76 y.o.   MRN: [id number]   Subjective:  I acted as a Education administrator for [employer], DO. Raiford Noble, [place]   Patient ID: Heidi Stark, female    DOB: [birth year], 76 y.o.   MRN: [id number]  Chief Complaint  Patient presents with  . Follow-up    Cellulitis of right lower extremity.    HPI  Patient is in today for a follow up visit for cellulitis of right extremity. Patient states that she has been feeling good up until today. Also wants to discuss the possibility of her symptoms being compartment syndrome. Patient has a Hx of HTN, Type II Diabetes, AF, COPD, OSA, hyperlipidemia. Patient has no acute concerns noted at this time.  Patient Care Team: [name], [name], DO as PCP - General Luberta Mutter, MD as Consulting Physician (Ophthalmology) Jacelyn Pi, MD as Consulting Physician (Endocrinology)   Past Medical History:  Diagnosis Date  . Arthritis    "hands, wrists" (11/07/2016)  . CHF (congestive heart failure) ([place])   . Chronic lower back pain   . COPD (chronic obstructive pulmonary disease) ([place])   . DVT (deep venous thrombosis) ([place])   . History of blood transfusion 1990   "related to OR"  . History of kidney stones   . Hyperlipidemia   . Hypertension   . On home oxygen therapy    "2L w/CPAP when I sleep" (11/07/2016)  . OSA on CPAP   . PAF (paroxysmal atrial fibrillation) ([place])   . Pneumonia    "several times" (11/07/2016)  . Type II diabetes mellitus ([place])     Past Surgical History:  Procedure Laterality Date  . ATRIAL FIBRILLATION ABLATION N/A 11/07/2016   Procedure: Atrial Fibrillation Ablation;  Surgeon: [name], MD;  Location: [facility];  Service: Cardiovascular;  Laterality: N/A;  . BACK SURGERY    . CARDIAC CATHETERIZATION    . CARDIOVERSION N/A 08/11/2016   Procedure: CARDIOVERSION;  Surgeon: Larey Dresser, MD;  Location: [facility];  Service: Cardiovascular;  Laterality:  N/A;  . CATARACT EXTRACTION W/ INTRAOCULAR LENS  IMPLANT, BILATERAL Bilateral   . COLON SURGERY  1990   vein graft and colon repair after nicked artery with back surgert  . COLONOSCOPY    . CYSTECTOMY     between bladder and kidneys  . GANGLION CYST EXCISION Left   . KNEE CARTILAGE SURGERY Right 1960s  . [place] SURGERY  1990  . REPAIR ILIAC ARTERY  1990   vein graft and colon repair after nicked artery with back surgert  . TEE WITHOUT CARDIOVERSION N/A 10/31/2013   Procedure: TRANSESOPHAGEAL ECHOCARDIOGRAM (TEE);  Surgeon: Larey Dresser, MD;  Location: [facility];  Service: Cardiovascular;  Laterality: N/A;  . TUBAL LIGATION      Family History  Problem Relation Age of Onset  . Diabetes Unknown   . Melanoma Unknown   . Factor V Leiden deficiency Daughter     Social History   Social History  . Marital status: Married    Spouse name: N/A  . Number of children: 2  . Years of education: N/A   Occupational History  . Retired    Social History Main Topics  . Smoking status: Former Smoker    Packs/day: 1.00    Years: 45.00    Types: Cigarettes    Quit date: 10/15/2016  . Smokeless tobacco: Never Used  . Alcohol use No  . Drug  use: No  . Sexual activity: Not on file   Other Topics Concern  . Not on file   Social History Narrative  . No narrative on file    Outpatient Medications Prior to Visit  Medication Sig Dispense Refill  . amiodarone (PACERONE) 200 MG tablet Take 1 tablet (200 mg total) by mouth daily. 30 tablet 3  . apixaban (ELIQUIS) 5 MG TABS tablet Take 1 tablet (5 mg total) by mouth 2 (two) times daily. 180 tablet 3  . doxycycline (VIBRA-TABS) 100 MG tablet Take 1 tablet (100 mg total) by mouth 2 (two) times daily. 20 tablet 0  . eplerenone (INSPRA) 25 MG tablet Take 1 tablet (25 mg total) by mouth daily. 30 tablet 3  . fenofibrate 160 MG tablet TAKE 1 TABLET (160 MG TOTAL) BY MOUTH DAILY. 90 tablet 1  . furosemide (LASIX) 20 MG tablet TAKE 2  TABLETS IN THE MORNING AND 1 TABLET IN THE EVENING 90 tablet 1  . glimepiride (AMARYL) 2 MG tablet Take 1 tablet (2 mg total) by mouth daily with breakfast. (Patient taking differently: Take 2-4 mg by mouth See admin instructions. Takes 2 tabs in am and 1 tab in pm)    . glucose blood (ONETOUCH VERIO) test strip Use as directed once daily to check blood sugar  E11.9 100 each 6  . insulin glargine (LANTUS) 100 UNIT/ML injection Inject 0.15 mLs (15 Units total) into the skin at bedtime. 10 units qhs x 30 days (Patient taking differently: Inject 15 Units into the skin at bedtime. ) 10 mL   . KLOR-CON 10 10 MEQ tablet TAKE 1 TABLET (10 MEQ TOTAL) BY MOUTH 2 (TWO) TIMES DAILY. 180 tablet 0  . metFORMIN (GLUCOPHAGE) 1000 MG tablet Take 1 tablet (1,000 mg total) by mouth 2 (two) times daily with a meal.    . metoprolol succinate (TOPROL-XL) 25 MG 24 hr tablet Take 1 tablet (25 mg total) by mouth 2 (two) times daily. 60 tablet 6  . polyethylene glycol (MIRALAX) packet Take 17 g by mouth daily as needed for mild constipation. 14 each 0  . rosuvastatin (CRESTOR) 10 MG tablet Take 1 tablet (10 mg total) by mouth daily. (Patient taking differently: Take 10 mg by mouth at bedtime. )    . sacubitril-valsartan (ENTRESTO) 24-26 MG Take 1 tablet by mouth 2 (two) times daily. 60 tablet 3   No facility-administered medications prior to visit.     Allergies  Allergen Reactions  . Neomycin-Bacitracin Zn-Polymyx Rash  . Sulfonamide Derivatives Other (See Comments)    Stomach cramps  . Ciprofloxacin Hives, Itching and Rash  . Spironolactone Rash    Review of Systems  Constitutional: Negative for fever and malaise/fatigue.  HENT: Negative for congestion.   Eyes: Negative for blurred vision.  Respiratory: Negative for cough and shortness of breath.   Cardiovascular: Negative for chest pain, palpitations and leg swelling.  Gastrointestinal: Negative for vomiting.  Musculoskeletal: Positive for joint pain.  Negative for back pain.  Skin: Negative for rash.  Neurological: Negative for loss of consciousness and headaches.       Objective:    Physical Exam  Constitutional: She is oriented to person, place, and time. She appears well-developed and well-nourished. No distress.  HENT:  Head: Normocephalic and atraumatic.  Eyes: Conjunctivae are normal.  Neck: Normal range of motion. No thyromegaly present.  Cardiovascular: Normal rate and regular rhythm.   Pulmonary/Chest: Effort normal and breath sounds normal. She has no wheezes.  Abdominal: Soft.  Bowel sounds are normal. There is no tenderness.  Musculoskeletal: She exhibits no edema or deformity.  Neurological: She is alert and oriented to person, place, and time.  Skin: Skin is warm and dry. She is not diaphoretic. There is erythema.     Psychiatric: She has a normal mood and affect.  Nursing note and vitals reviewed.   BP 136/66 (BP Location: Left Wrist, Patient Position: Sitting, Cuff Size: Normal)   Temp 98.4 F (36.9 C) (Oral)   Ht 5\' 6"  (1.676 m)   Wt 258 lb (117 kg)   BMI 41.64 kg/m  Wt Readings from Last 3 Encounters:  11/27/16 258 lb (117 kg)  11/21/16 257 lb 12.8 oz (116.9 kg)  11/08/16 267 lb 3.2 oz (121.2 kg)   BP Readings from Last 3 Encounters:  11/27/16 136/66  11/23/16 126/68  11/21/16 108/60     Immunization History  Administered Date(s) Administered  . Influenza Whole 04/28/2009, 04/27/2010  . Influenza, High Dose Seasonal PF 04/24/2014  . Influenza-Unspecified 04/17/2015  . Pneumococcal Conjugate-13 10/11/2015  . Pneumococcal Polysaccharide-23 07/17/2002, 11/29/2011  . Td 07/17/2002  . Zoster 11/11/2008    Health Maintenance  Topic Date Due  . DEXA SCAN  01/07/2007  . COLONOSCOPY  12/12/2011  . TETANUS/TDAP  07/17/2012  . MAMMOGRAM  11/28/2012  . URINE MICROALBUMIN  11/30/2015  . HEMOGLOBIN A1C  04/12/2016  . FOOT EXAM  07/06/2016  . OPHTHALMOLOGY EXAM  08/09/2016  . INFLUENZA VACCINE   05/18/2017 (Originally 02/14/2017)  . PNA vac Low Risk Adult  Completed    Lab Results  Component Value Date   WBC 9.3 10/25/2016   HGB 15.6 (H) 08/21/2016   HCT 45.5 10/25/2016   PLT 305 10/25/2016   GLUCOSE 64 (L) 10/25/2016   CHOL 112 10/11/2015   TRIG 187.0 (H) 10/11/2015   HDL 28.90 (L) 10/11/2015   LDLDIRECT 42.0 11/30/2014   LDLCALC 46 10/11/2015   ALT 30 08/21/2016   AST 36 08/21/2016   NA 144 10/25/2016   K 4.2 10/25/2016   CL 98 10/25/2016   CREATININE 0.86 10/25/2016   BUN 19 10/25/2016   CO2 29 10/25/2016   TSH 5.862 (H) 08/31/2016   INR 2.7 03/09/2014   HGBA1C 7.7 (H) 10/11/2015   MICROALBUR 9.3 (H) 11/30/2014    Lab Results  Component Value Date   TSH 5.862 (H) 08/31/2016   Lab Results  Component Value Date   WBC 9.3 10/25/2016   HGB 15.6 (H) 08/21/2016   HCT 45.5 10/25/2016   MCV 88 10/25/2016   PLT 305 10/25/2016   Lab Results  Component Value Date   NA 144 10/25/2016   K 4.2 10/25/2016   CO2 29 10/25/2016   GLUCOSE 64 (L) 10/25/2016   BUN 19 10/25/2016   CREATININE 0.86 10/25/2016   BILITOT 0.8 08/21/2016   ALKPHOS 52 08/21/2016   AST 36 08/21/2016   ALT 30 08/21/2016   PROT 6.7 08/21/2016   ALBUMIN 3.6 08/21/2016   CALCIUM 9.1 10/25/2016   ANIONGAP 11 09/28/2016   GFR 112.55 10/11/2015   Lab Results  Component Value Date   CHOL 112 10/11/2015   Lab Results  Component Value Date   HDL 28.90 (L) 10/11/2015   Lab Results  Component Value Date   LDLCALC 46 10/11/2015   Lab Results  Component Value Date   TRIG 187.0 (H) 10/11/2015   Lab Results  Component Value Date   CHOLHDL 4 10/11/2015   Lab Results  Component Value Date  HGBA1C 7.7 (H) 10/11/2015         Assessment & Plan:   Problem List Items Addressed This Visit      Unprioritized   Cellulitis of right lower extremity    Elevate leg Rocephin 1 g  Add augmentin to doxy-- pt will rto Wed and doxy d/c if healing well       Relevant Medications    amoxicillin-clavulanate (AUGMENTIN) 875-125 MG tablet    Other Visit Diagnoses    Pain of lower extremity, unspecified laterality    -  Primary   Relevant Orders   VAS [name] ABI WITH/WO TBI      I have discontinued Ms. Arbuthnot's cephALEXin. I am also having her start on amoxicillin-clavulanate. Additionally, I am having her maintain her rosuvastatin, metFORMIN, insulin glargine, glimepiride, fenofibrate, polyethylene glycol, sacubitril-valsartan, amiodarone, KLOR-CON 10, metoprolol succinate, eplerenone, furosemide, glucose blood, apixaban, and doxycycline.  Meds ordered this encounter  Medications  . DISCONTD: cephALEXin (KEFLEX) 500 MG capsule    Sig: Take 500 mg by mouth 2 (two) times daily.    Refill:  0  . amoxicillin-clavulanate (AUGMENTIN) 875-125 MG tablet    Sig: Take 1 tablet by mouth 2 (two) times daily.    Dispense:  20 tablet    Refill:  0    CMA served as scribe during this visit. History, Physical and Plan performed by medical provider. Documentation and orders reviewed and attested to.  Ann Held, DO

## 2016-11-27 NOTE — Assessment & Plan Note (Signed)
Elevate leg Rocephin 1 g  Add augmentin to doxy-- pt will rto Wed and doxy d/c if healing well

## 2016-11-28 NOTE — Progress Notes (Signed)
[place] at [facility] [address] [place], [place] [address] [phone]  Date:  11/29/2016   Name:  Heidi Stark   DOB:  [birth year]   MRN:  [id number]  PCP:  Ann Held, DO    Chief Complaint: Follow-up (Pt here for 2 day f/u. )   History of Present Illness:  Heidi Stark is a 76 y.o. very pleasant female patient who presents with the following:  Here today to follow-up a Right lower leg cellulitis- she was seen in the office on 5/14 and we added augmentin to her doxycycline.  We are rechecking her today to gauge her progress.   Pt feels that she is getting better!  Her leg is less painful and less red, and no longer hot to the touch She was having some chills but these are resolved, and she has not noted any fever or body aches. She has noted some nausea with the abx, but no diarrhea or vomiting  She is getting ABIs tomorrow  BP Readings from Last 3 Encounters:  11/29/16 122/70  11/27/16 136/66  11/23/16 126/68   Pulse Readings from Last 3 Encounters:  11/29/16 (!) 58  11/23/16 63  11/21/16 74     Patient Active Problem List   Diagnosis Date Noted  . Cellulitis of right lower extremity 11/27/2016  . Encounter for assessment for deep vein thrombosis (DVT) 11/14/2016  . Hypokalemia 08/13/2016  . Constipation 08/13/2016  . Acute respiratory failure with hypoxia ([place]) 08/10/2016  . Atrial fibrillation with rapid ventricular response ([place]) 08/10/2016  . Type 2 diabetes mellitus with hyperglycemia, with long-term current use of insulin ([place])   . Acute on chronic systolic CHF (congestive heart failure) ([place])   . Atherosclerosis of aorta ([place]) 06/26/2016  . Tobacco abuse 06/26/2016  . Colitis 11/24/2014  . Severe obesity (BMI >= 40) ([place]) 11/12/2013  . PAF (paroxysmal atrial fibrillation) ([place]) 11/03/2013  . 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 COLONIC  POLYPS 08/16/2010  . PAD (peripheral artery disease) ([place]) 08/15/2010  . HLD (hyperlipidemia) 11/18/2009  . MYALGIA 11/18/2009  . OBESITY 05/14/2007  . PULMONARY NODULE, RIGHT MIDDLE LOBE 05/14/2007  . Type 2 diabetes mellitus with vascular disease ([place]) 03/20/2007  . Obstructive sleep apnea 12/06/2006  . Essential hypertension 12/06/2006  . COPD (chronic obstructive pulmonary disease) ([place]) 12/06/2006    Past Medical History:  Diagnosis Date  . Arthritis    "hands, wrists" (11/07/2016)  . CHF (congestive heart failure) ([place])   . Chronic lower back pain   . COPD (chronic obstructive pulmonary disease) ([place])   . DVT (deep venous thrombosis) ([place])   . History of blood transfusion 1990   "related to OR"  . History of kidney stones   . Hyperlipidemia   . Hypertension   . On home oxygen therapy    "2L w/CPAP when I sleep" (11/07/2016)  . OSA on CPAP   . PAF (paroxysmal atrial fibrillation) ([place])   . Pneumonia    "several times" (11/07/2016)  . Type II diabetes mellitus ([place])     Past Surgical History:  Procedure Laterality Date  . ATRIAL FIBRILLATION ABLATION N/A 11/07/2016   Procedure: Atrial Fibrillation Ablation;  Surgeon: [name], MD;  Location: [facility];  Service: Cardiovascular;  Laterality: N/A;  . BACK SURGERY    . CARDIAC CATHETERIZATION    . CARDIOVERSION N/A 08/11/2016  Procedure: CARDIOVERSION;  Surgeon: Larey Dresser, MD;  Location: [facility];  Service: Cardiovascular;  Laterality: N/A;  . CATARACT EXTRACTION W/ INTRAOCULAR LENS  IMPLANT, BILATERAL Bilateral   . COLON SURGERY  1990   vein graft and colon repair after nicked artery with back surgert  . COLONOSCOPY    . CYSTECTOMY     between bladder and kidneys  . GANGLION CYST EXCISION Left   . KNEE CARTILAGE SURGERY Right 1960s  . [place] SURGERY  1990  . REPAIR ILIAC ARTERY  1990   vein graft and colon repair after nicked artery with back surgert  . TEE WITHOUT CARDIOVERSION  N/A 10/31/2013   Procedure: TRANSESOPHAGEAL ECHOCARDIOGRAM (TEE);  Surgeon: Larey Dresser, MD;  Location: [facility];  Service: Cardiovascular;  Laterality: N/A;  . TUBAL LIGATION      Social History  Substance Use Topics  . Smoking status: Former Smoker    Packs/day: 1.00    Years: 45.00    Types: Cigarettes    Quit date: 10/15/2016  . Smokeless tobacco: Never Used  . Alcohol use No    Family History  Problem Relation Age of Onset  . Diabetes Unknown   . Melanoma Unknown   . Factor V Leiden deficiency Daughter     Allergies  Allergen Reactions  . Neomycin-Bacitracin Zn-Polymyx Rash  . Sulfonamide Derivatives Other (See Comments)    Stomach cramps  . Ciprofloxacin Hives, Itching and Rash  . Spironolactone Rash    Medication list has been reviewed and updated.  Current Outpatient Prescriptions on File Prior to Visit  Medication Sig Dispense Refill  . amiodarone (PACERONE) 200 MG tablet Take 1 tablet (200 mg total) by mouth daily. 30 tablet 3  . amoxicillin-clavulanate (AUGMENTIN) 875-125 MG tablet Take 1 tablet by mouth 2 (two) times daily. 20 tablet 0  . apixaban (ELIQUIS) 5 MG TABS tablet Take 1 tablet (5 mg total) by mouth 2 (two) times daily. 180 tablet 3  . doxycycline (VIBRA-TABS) 100 MG tablet Take 1 tablet (100 mg total) by mouth 2 (two) times daily. 20 tablet 0  . eplerenone (INSPRA) 25 MG tablet Take 1 tablet (25 mg total) by mouth daily. 30 tablet 3  . fenofibrate 160 MG tablet TAKE 1 TABLET (160 MG TOTAL) BY MOUTH DAILY. 90 tablet 1  . furosemide (LASIX) 20 MG tablet TAKE 2 TABLETS IN THE MORNING AND 1 TABLET IN THE EVENING 90 tablet 1  . glimepiride (AMARYL) 2 MG tablet Take 1 tablet (2 mg total) by mouth daily with breakfast. (Patient taking differently: Take 2-4 mg by mouth See admin instructions. Takes 2 tabs in am and 1 tab in pm)    . glucose blood (ONETOUCH VERIO) test strip Use as directed once daily to check blood sugar  E11.9 100 each 6  . insulin  glargine (LANTUS) 100 UNIT/ML injection Inject 0.15 mLs (15 Units total) into the skin at bedtime. 10 units qhs x 30 days (Patient taking differently: Inject 15 Units into the skin at bedtime. ) 10 mL   . KLOR-CON 10 10 MEQ tablet TAKE 1 TABLET (10 MEQ TOTAL) BY MOUTH 2 (TWO) TIMES DAILY. 180 tablet 0  . metFORMIN (GLUCOPHAGE) 1000 MG tablet Take 1 tablet (1,000 mg total) by mouth 2 (two) times daily with a meal.    . metoprolol succinate (TOPROL-XL) 25 MG 24 hr tablet Take 1 tablet (25 mg total) by mouth 2 (two) times daily. 60 tablet 6  . polyethylene glycol (MIRALAX) packet  Take 17 g by mouth daily as needed for mild constipation. 14 each 0  . rosuvastatin (CRESTOR) 10 MG tablet Take 1 tablet (10 mg total) by mouth daily. (Patient taking differently: Take 10 mg by mouth at bedtime. )    . sacubitril-valsartan (ENTRESTO) 24-26 MG Take 1 tablet by mouth 2 (two) times daily. 60 tablet 3   No current facility-administered medications on file prior to visit.     Review of Systems:  As per HPI- otherwise negative.   Physical Examination: Vitals:   11/29/16 1212  BP: 122/70  Pulse: (!) 58  Temp: 98.3 F (36.8 C)   Vitals:   There is no height or weight on file to calculate BMI. Ideal Body Weight:    GEN: WDWN, NAD, Non-toxic, A & O x 3, looks well, obese, sitting in WC today.  Accompanied by her husband  HEENT: Atraumatic, Normocephalic. Neck supple. No masses, No LAD. Ears and Nose: No external deformity. CV: RRR, No M/G/R. No JVD. No thrill. No extra heart sounds. PULM: CTA B, no wheezes, crackles, rhonchi. No retractions. No resp. distress. No accessory muscle use. EXTR: No clubbing.  Right lower leg/ shin is still slighted inflamed in appearance but less so than 48 hours ago.  It is not longer warm to the touch. It shows resolving mild edema.  She is still tender over the anterior distal shin but less so  PSYCH: Normally interactive. Conversant. Not depressed or anxious appearing.   Calm demeanor.    Assessment and Plan: Cellulitis of right lower extremity  Improving. She is having ABIs tomorrow Will have her continue doxy for 2 days more, then stop and finish out augmentin.  Recommend a probiotic   Signed Lamar Blinks, MD

## 2016-11-29 ENCOUNTER — Ambulatory Visit (INDEPENDENT_AMBULATORY_CARE_PROVIDER_SITE_OTHER): Payer: Medicare Other | Admitting: Family Medicine

## 2016-11-29 VITALS — BP 122/70 | HR 58 | Temp 98.3°F

## 2016-11-29 DIAGNOSIS — L03115 Cellulitis of right lower limb: Secondary | ICD-10-CM | POA: Diagnosis not present

## 2016-11-29 NOTE — Patient Instructions (Signed)
It was good to see your today- I am glad that your leg is doing better!  I suspect it is the augmentin that is helping.  Keep taking the doxycycline through Friday, then you can stop this and just take the augmentin until gone  An OTC probiotic (good bacteria supplement) and/ or daily yogurt would also be a good idea to prevent diarrhea  If your foot/ leg do not continue to improve please contact us or come back in

## 2016-11-30 ENCOUNTER — Ambulatory Visit (HOSPITAL_COMMUNITY)
Admission: RE | Admit: 2016-11-30 | Discharge: 2016-11-30 | Disposition: A | Discharge status: Home / Self Care | Payer: Medicare Other | Source: Ambulatory Visit | Attending: Vascular Surgery | Admitting: Vascular Surgery

## 2016-11-30 DIAGNOSIS — M79606 Pain in leg, unspecified: Secondary | ICD-10-CM | POA: Diagnosis not present

## 2016-12-06 ENCOUNTER — Encounter (HOSPITAL_COMMUNITY): Payer: Self-pay | Admitting: Nurse Practitioner

## 2016-12-06 ENCOUNTER — Ambulatory Visit (HOSPITAL_COMMUNITY)
Admission: RE | Admit: 2016-12-06 | Discharge: 2016-12-06 | Disposition: A | Discharge status: Home / Self Care | Payer: Medicare Other | Source: Ambulatory Visit | Attending: Nurse Practitioner | Admitting: Nurse Practitioner

## 2016-12-06 VITALS — BP 112/66 | HR 61 | Ht 66.0 in | Wt 261.6 lb

## 2016-12-06 DIAGNOSIS — X58XXXA Exposure to other specified factors, initial encounter: Secondary | ICD-10-CM | POA: Diagnosis not present

## 2016-12-06 DIAGNOSIS — I440 Atrioventricular block, first degree: Secondary | ICD-10-CM | POA: Insufficient documentation

## 2016-12-06 DIAGNOSIS — S301XXA Contusion of abdominal wall, initial encounter: Secondary | ICD-10-CM | POA: Insufficient documentation

## 2016-12-06 DIAGNOSIS — Z794 Long term (current) use of insulin: Secondary | ICD-10-CM | POA: Diagnosis not present

## 2016-12-06 DIAGNOSIS — G4733 Obstructive sleep apnea (adult) (pediatric): Secondary | ICD-10-CM | POA: Insufficient documentation

## 2016-12-06 DIAGNOSIS — Z7901 Long term (current) use of anticoagulants: Secondary | ICD-10-CM | POA: Insufficient documentation

## 2016-12-06 DIAGNOSIS — Z9889 Other specified postprocedural states: Secondary | ICD-10-CM | POA: Diagnosis not present

## 2016-12-06 DIAGNOSIS — E785 Hyperlipidemia, unspecified: Secondary | ICD-10-CM | POA: Diagnosis not present

## 2016-12-06 DIAGNOSIS — Z87891 Personal history of nicotine dependence: Secondary | ICD-10-CM | POA: Insufficient documentation

## 2016-12-06 DIAGNOSIS — E119 Type 2 diabetes mellitus without complications: Secondary | ICD-10-CM | POA: Insufficient documentation

## 2016-12-06 DIAGNOSIS — Z79899 Other long term (current) drug therapy: Secondary | ICD-10-CM | POA: Insufficient documentation

## 2016-12-06 DIAGNOSIS — I509 Heart failure, unspecified: Secondary | ICD-10-CM | POA: Diagnosis not present

## 2016-12-06 DIAGNOSIS — I480 Paroxysmal atrial fibrillation: Secondary | ICD-10-CM | POA: Diagnosis not present

## 2016-12-06 DIAGNOSIS — I4891 Unspecified atrial fibrillation: Secondary | ICD-10-CM | POA: Diagnosis present

## 2016-12-06 DIAGNOSIS — I110 Hypertensive heart disease with heart failure: Secondary | ICD-10-CM | POA: Insufficient documentation

## 2016-12-06 MED ORDER — APIXABAN 5 MG PO TABS: 5.0000 mg | ORAL_TABLET | Freq: Two times a day (BID) | ORAL | 3 refills | Status: DC

## 2016-12-06 NOTE — Progress Notes (Signed)
Primary Care Physician: [name], [name], DO Referring Physician: Dr. Beola Cord Heidi Stark is a 76 y.o. female with a h/o afib s/p ablation, 11/07/16, in the afib clinic for f/u. She reports that she has not been aware of any afib but did experience a rt groin hematoma which then progressed into cellulitis. After several antibiotics, she is seeing improvement, but rt leg is still inflamed and sore to touch. PCP is following. Scans of leg and groin ordered by PCP negative, per pt.No swallowing difficulties.  Today, she denies symptoms of palpitations, chest pain, shortness of breath, orthopnea, PND, lower extremity edema, dizziness, presyncope, syncope, or neurologic sequela. Positive for sore inflamed lower rt leg. The patient is tolerating medications without difficulties and is otherwise without complaint today.   Past Medical History:  Diagnosis Date  . Arthritis    "hands, wrists" (11/07/2016)  . CHF (congestive heart failure) ([place])   . Chronic lower back pain   . COPD (chronic obstructive pulmonary disease) ([place])   . DVT (deep venous thrombosis) ([place])   . History of blood transfusion 1990   "related to OR"  . History of kidney stones   . Hyperlipidemia   . Hypertension   . On home oxygen therapy    "2L w/CPAP when I sleep" (11/07/2016)  . OSA on CPAP   . PAF (paroxysmal atrial fibrillation) ([place])   . Pneumonia    "several times" (11/07/2016)  . Type II diabetes mellitus ([place])    Past Surgical History:  Procedure Laterality Date  . ATRIAL FIBRILLATION ABLATION N/A 11/07/2016   Procedure: Atrial Fibrillation Ablation;  Surgeon: [name], MD;  Location: [facility];  Service: Cardiovascular;  Laterality: N/A;  . BACK SURGERY    . CARDIAC CATHETERIZATION    . CARDIOVERSION N/A 08/11/2016   Procedure: CARDIOVERSION;  Surgeon: Larey Dresser, MD;  Location: [facility];  Service: Cardiovascular;  Laterality: N/A;  . CATARACT EXTRACTION W/ INTRAOCULAR  LENS  IMPLANT, BILATERAL Bilateral   . COLON SURGERY  1990   vein graft and colon repair after nicked artery with back surgert  . COLONOSCOPY    . CYSTECTOMY     between bladder and kidneys  . GANGLION CYST EXCISION Left   . KNEE CARTILAGE SURGERY Right 1960s  . [place] SURGERY  1990  . REPAIR ILIAC ARTERY  1990   vein graft and colon repair after nicked artery with back surgert  . TEE WITHOUT CARDIOVERSION N/A 10/31/2013   Procedure: TRANSESOPHAGEAL ECHOCARDIOGRAM (TEE);  Surgeon: Larey Dresser, MD;  Location: [facility];  Service: Cardiovascular;  Laterality: N/A;  . TUBAL LIGATION      Current Outpatient Prescriptions  Medication Sig Dispense Refill  . amiodarone (PACERONE) 200 MG tablet Take 1 tablet (200 mg total) by mouth daily. 30 tablet 3  . amoxicillin-clavulanate (AUGMENTIN) 875-125 MG tablet Take 1 tablet by mouth 2 (two) times daily. 20 tablet 0  . apixaban (ELIQUIS) 5 MG TABS tablet Take 1 tablet (5 mg total) by mouth 2 (two) times daily. 180 tablet 3  . eplerenone (INSPRA) 25 MG tablet Take 1 tablet (25 mg total) by mouth daily. 30 tablet 3  . fenofibrate 160 MG tablet TAKE 1 TABLET (160 MG TOTAL) BY MOUTH DAILY. 90 tablet 1  . furosemide (LASIX) 20 MG tablet TAKE 2 TABLETS IN THE MORNING AND 1 TABLET IN THE EVENING 90 tablet 1  . glimepiride (AMARYL) 2 MG tablet Take 1 tablet (2  mg total) by mouth daily with breakfast. (Patient taking differently: Take 2-4 mg by mouth See admin instructions. Takes 2 tabs in am and 1 tab in pm)    . glucose blood (ONETOUCH VERIO) test strip Use as directed once daily to check blood sugar  E11.9 100 each 6  . insulin glargine (LANTUS) 100 UNIT/ML injection Inject 0.15 mLs (15 Units total) into the skin at bedtime. 10 units qhs x 30 days (Patient taking differently: Inject 15 Units into the skin at bedtime. ) 10 mL   . KLOR-CON 10 10 MEQ tablet TAKE 1 TABLET (10 MEQ TOTAL) BY MOUTH 2 (TWO) TIMES DAILY. 180 tablet 0  . metFORMIN  (GLUCOPHAGE) 1000 MG tablet Take 1 tablet (1,000 mg total) by mouth 2 (two) times daily with a meal.    . metoprolol succinate (TOPROL-XL) 25 MG 24 hr tablet Take 1 tablet (25 mg total) by mouth 2 (two) times daily. 60 tablet 6  . polyethylene glycol (MIRALAX) packet Take 17 g by mouth daily as needed for mild constipation. 14 each 0  . rosuvastatin (CRESTOR) 10 MG tablet Take 1 tablet (10 mg total) by mouth daily. (Patient taking differently: Take 10 mg by mouth at bedtime. )    . sacubitril-valsartan (ENTRESTO) 24-26 MG Take 1 tablet by mouth 2 (two) times daily. 60 tablet 3   No current facility-administered medications for this encounter.     Allergies  Allergen Reactions  . Neomycin-Bacitracin Zn-Polymyx Rash  . Sulfonamide Derivatives Other (See Comments)    Stomach cramps  . Ciprofloxacin Hives, Itching and Rash  . Spironolactone Rash    Social History   Social History  . Marital status: Married    Spouse name: N/A  . Number of children: 2  . Years of education: N/A   Occupational History  . Retired    Social History Main Topics  . Smoking status: Former Smoker    Packs/day: 1.00    Years: 45.00    Types: Cigarettes    Quit date: 10/15/2016  . Smokeless tobacco: Never Used  . Alcohol use No  . Drug use: No  . Sexual activity: Not on file   Other Topics Concern  . Not on file   Social History Narrative  . No narrative on file    Family History  Problem Relation Age of Onset  . Diabetes Unknown   . Melanoma Unknown   . Factor V Leiden deficiency Daughter     ROS- All systems are reviewed and negative except as per the HPI above  Physical Exam: Vitals:   12/06/16 1337  BP: 112/66  Pulse: 61  Weight: 261 lb 9.6 oz (118.7 kg)  Height: 5\' 6"  (1.676 m)   Wt Readings from Last 3 Encounters:  12/06/16 261 lb 9.6 oz (118.7 kg)  11/27/16 258 lb (117 kg)  11/21/16 257 lb 12.8 oz (116.9 kg)    Labs: Lab Results  Component Value Date   NA 144 10/25/2016    K 4.2 10/25/2016   CL 98 10/25/2016   CO2 29 10/25/2016   GLUCOSE 64 (L) 10/25/2016   BUN 19 10/25/2016   CREATININE 0.86 10/25/2016   CALCIUM 9.1 10/25/2016   MG 2.4 08/11/2016   Lab Results  Component Value Date   INR 2.7 03/09/2014   Lab Results  Component Value Date   CHOL 112 10/11/2015   HDL 28.90 (L) 10/11/2015   LDLCALC 46 10/11/2015   TRIG 187.0 (H) 10/11/2015  GEN- The patient is well appearing, alert and oriented x 3 today.   Head- normocephalic, atraumatic Eyes-  Sclera clear, conjunctiva pink Ears- hearing intact Oropharynx- clear Neck- supple, no JVP Lymph- no cervical lymphadenopathy Lungs- Clear to ausculation bilaterally, normal work of breathing Heart- Regular rate and rhythm, no murmurs, rubs or gallops, PMI not laterally displaced GI- soft, NT, ND, + BS Extremities- no clubbing, cyanosis, or edema MS- no significant deformity or atrophy Skin- no rash or lesion Psych- euthymic mood, full affect Neuro- strength and sensation are intact  EKG-SR with first degree AV block, LAD, NSIVD Epic records reviewed    Assessment and Plan: 1. Afib s/p ablation Doing well post ablation with no afib noted Continue amiodarone 200 mg a day for now  Continue on eliquis 5 mg bid- having trouble affording eliquis due to  do nut hole, pt assistance forms given  2. Rt groin hematoma/cellulitis Resolving  Treatment per  pcp  F/u with Dr. [name] 02/06/17, HF clinic 6/19   Geroge Baseman. Avanni Turnbaugh, [facility] [address] [place], [place] [address] [phone]

## 2016-12-09 ENCOUNTER — Other Ambulatory Visit: Payer: Self-pay | Admitting: Family Medicine

## 2016-12-14 ENCOUNTER — Other Ambulatory Visit (HOSPITAL_COMMUNITY): Payer: Self-pay | Admitting: Cardiology

## 2016-12-16 ENCOUNTER — Other Ambulatory Visit: Payer: Self-pay | Admitting: Family Medicine

## 2016-12-18 ENCOUNTER — Telehealth: Payer: Self-pay | Admitting: Cardiology

## 2016-12-18 ENCOUNTER — Telehealth: Payer: Self-pay | Admitting: Family Medicine

## 2016-12-18 NOTE — Telephone Encounter (Signed)
Called left message to call back 

## 2016-12-18 NOTE — Telephone Encounter (Signed)
Patient notified and scheduled with DR. Lowne 12/19/16 at [facility]

## 2016-12-18 NOTE — Telephone Encounter (Signed)
Patient states she finished her antibiotics for Cellulitis last week but she is still having symptoms of pain and swelling in her right leg she is asking if she needs to take another round of antibiotics. [phone]

## 2016-12-18 NOTE — Telephone Encounter (Signed)
New message   Patient calling the office for samples of medication:   1.  What medication and dosage are you requesting samples for? entresto  2.  Are you currently out of this medication? Cannot afford to pay for the [facility] Rx so needs samples and was told to call someone for forms for assistance with medication Rx.

## 2016-12-18 NOTE — Telephone Encounter (Signed)
Someone probably needs to see it---- does anyone have opening today or tomorrow?

## 2016-12-18 NOTE — Telephone Encounter (Signed)
Patient reports she is in the donut hole and cannot afford eliquis $420 and entresto $186  Advised will provide samples for one month and route message to [facility] for assistance with PAP for medications  Medication Samples have been provided to the patient.  Drug name: entresto       Strength: 24/26        Qty: 28  LOT: [id number]  Exp.Date: 10/2018  Dosing instructions: one tab twice a day  The patient has been instructed regarding the correct time, dose, and frequency of taking this medication, including desired effects and most common side effects.   Garlan Fair M 3:16 PM 12/18/2016   Medication Samples have been provided to the patient.  Drug name: eliquis       Strength: 5 mg        Qty: 56  LOT: [id number]  Exp.Date: 10/2018  Dosing instructions: one tab twice a day  The patient has been instructed regarding the correct time, dose, and frequency of taking this medication, including desired effects and most common side effects.   Garlan Fair M 3:21 PM 12/18/2016

## 2016-12-19 ENCOUNTER — Ambulatory Visit (INDEPENDENT_AMBULATORY_CARE_PROVIDER_SITE_OTHER): Payer: Medicare Other | Admitting: Family Medicine

## 2016-12-19 ENCOUNTER — Encounter: Payer: Self-pay | Admitting: Family Medicine

## 2016-12-19 VITALS — BP 132/70 | HR 61 | Temp 98.5°F | Resp 16 | Ht 66.0 in | Wt 261.0 lb

## 2016-12-19 DIAGNOSIS — I739 Peripheral vascular disease, unspecified: Secondary | ICD-10-CM | POA: Diagnosis not present

## 2016-12-19 NOTE — Telephone Encounter (Signed)
Left VM for patient to call back. Like we discussed previously, I will need a pharmacy print out for her to be eligible for Eliquis patient assistance. I can enroll her in [employer] foundation to cover the cost of her Delene Loll but will need her SSN and SSI information.   Ruta Hinds. Velva Harman, PharmD, BCPS, CPP Clinical Pharmacist Pager: [phone] Phone: [phone] 12/19/2016 10:30 AM

## 2016-12-19 NOTE — Patient Instructions (Signed)

## 2016-12-19 NOTE — Progress Notes (Signed)
Patient ID: Heidi Stark, female   DOB: 1940-08-14, 76 y.o.   MRN: [id number]      Subjective:  I acted as a Education administrator for Dr. [name].  Guerry Bruin, [place]   Patient ID: Heidi Stark, female    DOB: [birth year], 76 y.o.   MRN: [id number]  Chief Complaint  Patient presents with  . Cellulitis    right lower leg, no better    HPI  Patient is in today for cellulitis right lower leg.  She has had this for a while.  She states that sometimes it " feels like its on fire."  Patient Care Team: [name], [name], DO as PCP - General Luberta Mutter, MD as Consulting Physician (Ophthalmology) Jacelyn Pi, MD as Consulting Physician (Endocrinology)   Past Medical History:  Diagnosis Date  . Arthritis    "hands, wrists" (11/07/2016)  . CHF (congestive heart failure) ([place])   . Chronic lower back pain   . COPD (chronic obstructive pulmonary disease) ([place])   . DVT (deep venous thrombosis) ([place])   . History of blood transfusion 1990   "related to OR"  . History of kidney stones   . Hyperlipidemia   . Hypertension   . On home oxygen therapy    "2L w/CPAP when I sleep" (11/07/2016)  . OSA on CPAP   . PAF (paroxysmal atrial fibrillation) ([place])   . Pneumonia    "several times" (11/07/2016)  . Type II diabetes mellitus ([place])     Past Surgical History:  Procedure Laterality Date  . ATRIAL FIBRILLATION ABLATION N/A 11/07/2016   Procedure: Atrial Fibrillation Ablation;  Surgeon: [name], MD;  Location: [facility];  Service: Cardiovascular;  Laterality: N/A;  . BACK SURGERY    . CARDIAC CATHETERIZATION    . CARDIOVERSION N/A 08/11/2016   Procedure: CARDIOVERSION;  Surgeon: Larey Dresser, MD;  Location: [facility];  Service: Cardiovascular;  Laterality: N/A;  . CATARACT EXTRACTION W/ INTRAOCULAR LENS  IMPLANT, BILATERAL Bilateral   . COLON SURGERY  1990   vein graft and colon repair after nicked artery with back surgert  . COLONOSCOPY    . CYSTECTOMY     between bladder and kidneys  . GANGLION CYST EXCISION Left   . KNEE CARTILAGE SURGERY Right 1960s  . [place] SURGERY  1990  . REPAIR ILIAC ARTERY  1990   vein graft and colon repair after nicked artery with back surgert  . TEE WITHOUT CARDIOVERSION N/A 10/31/2013   Procedure: TRANSESOPHAGEAL ECHOCARDIOGRAM (TEE);  Surgeon: Larey Dresser, MD;  [facility];  Service: Cardiovascular;  Laterality: N/A;  . TUBAL LIGATION      Family History  Problem Relation Age of Onset  . Diabetes Unknown   . Melanoma Unknown   . Factor V Leiden deficiency Daughter     Social History   Social History  . Marital status: Married    Spouse name: N/A  . Number of children: 2  . Years of education: N/A   Occupational History  . Retired    Social History Main Topics  . Smoking status: Former Smoker    Packs/day: 1.00    Years: 45.00    Types: Cigarettes    Quit date: 10/15/2016  . Smokeless tobacco: Never Used  . Alcohol use No  . Drug use: No  . Sexual activity: Not on file   Other Topics Concern  . Not on file   Social History Narrative  . No narrative on  file    Outpatient Medications Prior to Visit  Medication Sig Dispense Refill  . amiodarone (PACERONE) 200 MG tablet Take 1 tablet (200 mg total) by mouth daily. 30 tablet 3  . apixaban (ELIQUIS) 5 MG TABS tablet Take 1 tablet (5 mg total) by mouth 2 (two) times daily. 180 tablet 3  . ENTRESTO 24-26 MG TAKE 1 TABLET BY MOUTH 2 (TWO) TIMES DAILY. 180 tablet 3  . eplerenone (INSPRA) 25 MG tablet Take 1 tablet (25 mg total) by mouth daily. 30 tablet 3  . fenofibrate 160 MG tablet TAKE 1 TABLET (160 MG TOTAL) BY MOUTH DAILY. 90 tablet 1  . furosemide (LASIX) 20 MG tablet TAKE 2 TABLETS IN THE MORNING AND 1 TABLET IN THE EVENING 90 tablet 1  . glucose blood (ONETOUCH VERIO) test strip Use as directed once daily to check blood sugar  E11.9 100 each 6  . KLOR-CON 10 10 MEQ tablet TAKE 1 TABLET (10 MEQ TOTAL) BY MOUTH 2 (TWO)  TIMES DAILY. 180 tablet 1  . metFORMIN (GLUCOPHAGE) 1000 MG tablet Take 1 tablet (1,000 mg total) by mouth 2 (two) times daily with a meal.    . metoprolol succinate (TOPROL-XL) 25 MG 24 hr tablet Take 1 tablet (25 mg total) by mouth 2 (two) times daily. 60 tablet 6  . polyethylene glycol (MIRALAX) packet Take 17 g by mouth daily as needed for mild constipation. 14 each 0  . rosuvastatin (CRESTOR) 10 MG tablet Take 1 tablet (10 mg total) by mouth daily. (Patient taking differently: Take 10 mg by mouth at bedtime. )    . amoxicillin-clavulanate (AUGMENTIN) 875-125 MG tablet Take 1 tablet by mouth 2 (two) times daily. 20 tablet 0  . glimepiride (AMARYL) 2 MG tablet Take 1 tablet (2 mg total) by mouth daily with breakfast. (Patient taking differently: Take 2-4 mg by mouth See admin instructions. Takes 2 tabs in am and 1 tab in pm)    . insulin glargine (LANTUS) 100 UNIT/ML injection Inject 0.15 mLs (15 Units total) into the skin at bedtime. 10 units qhs x 30 days (Patient taking differently: Inject 10 Units into the skin at bedtime. ) 10 mL    No facility-administered medications prior to visit.     Allergies  Allergen Reactions  . Neomycin-Bacitracin Zn-Polymyx Rash  . Sulfonamide Derivatives Other (See Comments)    Stomach cramps  . Ciprofloxacin Hives, Itching and Rash  . Spironolactone Rash    Review of Systems  Constitutional: Negative for fever and malaise/fatigue.  HENT: Negative for congestion.   Eyes: Negative for blurred vision.  Respiratory: Negative for cough and shortness of breath.   Cardiovascular: Negative for chest pain, palpitations and leg swelling.  Gastrointestinal: Negative for vomiting.  Musculoskeletal: Negative for back pain.  Skin: Negative for rash.       Cellulitis right lower leg  Neurological: Negative for loss of consciousness and headaches.       Objective:    Physical Exam  Constitutional: She is oriented to person, place, and time. She appears  well-developed and well-nourished.  HENT:  Head: Normocephalic and atraumatic.  Eyes: Conjunctivae and EOM are normal.  Neck: Normal range of motion. Neck supple. No JVD present. Carotid bruit is not present. No thyromegaly present.  Cardiovascular: Normal rate, regular rhythm and normal heart sounds.   No murmur heard. Pulmonary/Chest: Effort normal and breath sounds normal. No respiratory distress. She has no wheezes. She has no rales. She exhibits no tenderness.  Musculoskeletal: She  exhibits no edema.  Neurological: She is alert and oriented to person, place, and time.  Skin: Skin is warm and dry. There is erythema.     Psychiatric: She has a normal mood and affect.  Nursing note and vitals reviewed.   BP 132/70 (BP Location: Left Arm, Cuff Size: Large)   Pulse 61   Temp 98.5 F (36.9 C) (Oral)   Resp 16   Ht 5\' 6"  (1.676 m)   Wt 261 lb (118.4 kg)   SpO2 95%   BMI 42.13 kg/m  Wt Readings from Last 3 Encounters:  12/19/16 261 lb (118.4 kg)  12/06/16 261 lb 9.6 oz (118.7 kg)  11/27/16 258 lb (117 kg)   BP Readings from Last 3 Encounters:  12/19/16 132/70  12/06/16 112/66  11/29/16 122/70     Immunization History  Administered Date(s) Administered  . Influenza Whole 04/28/2009, 04/27/2010  . Influenza, High Dose Seasonal PF 04/24/2014  . Influenza-Unspecified 04/17/2015  . Pneumococcal Conjugate-13 10/11/2015  . Pneumococcal Polysaccharide-23 07/17/2002, 11/29/2011  . Td 07/17/2002  . Zoster 11/11/2008    Health Maintenance  Topic Date Due  . DEXA SCAN  01/07/2007  . COLONOSCOPY  12/12/2011  . TETANUS/TDAP  07/17/2012  . MAMMOGRAM  11/28/2012  . URINE MICROALBUMIN  11/30/2015  . HEMOGLOBIN A1C  04/12/2016  . FOOT EXAM  07/06/2016  . OPHTHALMOLOGY EXAM  08/09/2016  . INFLUENZA VACCINE  05/18/2017 (Originally 02/14/2017)  . PNA vac Low Risk Adult  Completed    Lab Results  Component Value Date   WBC 9.3 10/25/2016   HGB 15.6 (H) 08/21/2016   HCT 45.5  10/25/2016   PLT 305 10/25/2016   GLUCOSE 64 (L) 10/25/2016   CHOL 112 10/11/2015   TRIG 187.0 (H) 10/11/2015   HDL 28.90 (L) 10/11/2015   LDLDIRECT 42.0 11/30/2014   LDLCALC 46 10/11/2015   ALT 30 08/21/2016   AST 36 08/21/2016   NA 144 10/25/2016   K 4.2 10/25/2016   CL 98 10/25/2016   CREATININE 0.86 10/25/2016   BUN 19 10/25/2016   CO2 29 10/25/2016   TSH 5.862 (H) 08/31/2016   INR 2.7 03/09/2014   HGBA1C 7.7 (H) 10/11/2015   MICROALBUR 9.3 (H) 11/30/2014    Lab Results  Component Value Date   TSH 5.862 (H) 08/31/2016   Lab Results  Component Value Date   WBC 9.3 10/25/2016   HGB 15.6 (H) 08/21/2016   HCT 45.5 10/25/2016   MCV 88 10/25/2016   PLT 305 10/25/2016   Lab Results  Component Value Date   NA 144 10/25/2016   K 4.2 10/25/2016   CO2 29 10/25/2016   GLUCOSE 64 (L) 10/25/2016   BUN 19 10/25/2016   CREATININE 0.86 10/25/2016   BILITOT 0.8 08/21/2016   ALKPHOS 52 08/21/2016   AST 36 08/21/2016   ALT 30 08/21/2016   PROT 6.7 08/21/2016   ALBUMIN 3.6 08/21/2016   CALCIUM 9.1 10/25/2016   ANIONGAP 11 09/28/2016   GFR 112.55 10/11/2015   Lab Results  Component Value Date   CHOL 112 10/11/2015   Lab Results  Component Value Date   HDL 28.90 (L) 10/11/2015   Lab Results  Component Value Date   LDLCALC 46 10/11/2015   Lab Results  Component Value Date   TRIG 187.0 (H) 10/11/2015   Lab Results  Component Value Date   CHOLHDL 4 10/11/2015   Lab Results  Component Value Date   HGBA1C 7.7 (H) 10/11/2015  Assessment & Plan:   Problem List Items Addressed This Visit      Unprioritized   PAD (peripheral artery disease) ([place]) - Primary    Elevated legs Lidocaine 4 % otc       Relevant Orders   Ambulatory referral to Vascular Surgery              I have discontinued Ms. Linz's amoxicillin-clavulanate. I am also having her maintain her rosuvastatin, metFORMIN, fenofibrate, polyethylene glycol, amiodarone, metoprolol  succinate, eplerenone, glucose blood, apixaban, KLOR-CON 10, ENTRESTO, furosemide, glimepiride, and insulin glargine.  Meds ordered this encounter  Medications  . glimepiride (AMARYL) 2 MG tablet    Sig: Take 2 mg by mouth as directed. Take 2 tablets every morning and 1 tablet tablets every evening.  . insulin glargine (LANTUS) 100 UNIT/ML injection    Sig: Inject 10 Units into the skin at bedtime.    CMA served as Education administrator during this visit. History, Physical and Plan performed by medical provider. Documentation and orders reviewed and attested to.  Ann Held, DO

## 2016-12-19 NOTE — Assessment & Plan Note (Signed)
Elevated legs Lidocaine 4 % otc

## 2016-12-20 ENCOUNTER — Other Ambulatory Visit: Payer: Self-pay | Admitting: Family Medicine

## 2016-12-21 ENCOUNTER — Telehealth (HOSPITAL_COMMUNITY): Payer: Self-pay | Admitting: Pharmacist

## 2016-12-21 NOTE — Telephone Encounter (Signed)
Enrolled Ms. Kaucher in [place] so that she will have $800 toward her copay costs through 12/20/2017. Relayed info to [facility] who verified $0 copay.   Member ID: [id number] Group ID: [id number] RxBin ID: [id number] PCN: [id number] Eligibility Start Date: 09/22/2016 Eligibility End Date: 12/20/2017 Assistance Amount: $800.00   Doroteo Bradford K. Velva Harman, PharmD, BCPS, CPP Clinical Pharmacist Pager: [phone] Phone: [phone] 12/21/2016 11:13 AM

## 2016-12-22 NOTE — Telephone Encounter (Signed)
Pt will call the office when she is ready to schedule AWV. Pt is currently having issues with heart.

## 2016-12-24 ENCOUNTER — Other Ambulatory Visit: Payer: Self-pay | Admitting: Family Medicine

## 2016-12-25 ENCOUNTER — Ambulatory Visit: Payer: Medicare Other | Admitting: Physician Assistant

## 2016-12-26 ENCOUNTER — Other Ambulatory Visit: Payer: Self-pay | Admitting: Surgery

## 2016-12-26 DIAGNOSIS — I7092 Chronic total occlusion of artery of the extremities: Secondary | ICD-10-CM

## 2017-01-01 ENCOUNTER — Encounter (HOSPITAL_COMMUNITY): Payer: Self-pay

## 2017-01-01 ENCOUNTER — Ambulatory Visit (HOSPITAL_COMMUNITY)
Admission: RE | Admit: 2017-01-01 | Discharge: 2017-01-01 | Disposition: A | Discharge status: Home / Self Care | Payer: Medicare Other | Source: Ambulatory Visit | Attending: Cardiology | Admitting: Cardiology

## 2017-01-01 VITALS — BP 152/90 | HR 58 | Wt 262.5 lb

## 2017-01-01 DIAGNOSIS — I5022 Chronic systolic (congestive) heart failure: Secondary | ICD-10-CM | POA: Diagnosis present

## 2017-01-01 DIAGNOSIS — Z7901 Long term (current) use of anticoagulants: Secondary | ICD-10-CM | POA: Diagnosis not present

## 2017-01-01 DIAGNOSIS — Z79899 Other long term (current) drug therapy: Secondary | ICD-10-CM | POA: Insufficient documentation

## 2017-01-01 DIAGNOSIS — I480 Paroxysmal atrial fibrillation: Secondary | ICD-10-CM | POA: Diagnosis not present

## 2017-01-01 DIAGNOSIS — Z87891 Personal history of nicotine dependence: Secondary | ICD-10-CM | POA: Diagnosis not present

## 2017-01-01 DIAGNOSIS — I2510 Atherosclerotic heart disease of native coronary artery without angina pectoris: Secondary | ICD-10-CM | POA: Insufficient documentation

## 2017-01-01 DIAGNOSIS — E1151 Type 2 diabetes mellitus with diabetic peripheral angiopathy without gangrene: Secondary | ICD-10-CM | POA: Diagnosis not present

## 2017-01-01 DIAGNOSIS — I739 Peripheral vascular disease, unspecified: Secondary | ICD-10-CM

## 2017-01-01 DIAGNOSIS — I110 Hypertensive heart disease with heart failure: Secondary | ICD-10-CM | POA: Diagnosis not present

## 2017-01-01 DIAGNOSIS — E785 Hyperlipidemia, unspecified: Secondary | ICD-10-CM | POA: Diagnosis not present

## 2017-01-01 DIAGNOSIS — J449 Chronic obstructive pulmonary disease, unspecified: Secondary | ICD-10-CM | POA: Insufficient documentation

## 2017-01-01 DIAGNOSIS — Z794 Long term (current) use of insulin: Secondary | ICD-10-CM | POA: Diagnosis not present

## 2017-01-01 LAB — COMPREHENSIVE METABOLIC PANEL
ALT: 28 U/L (ref 14–54)
AST: 37 U/L (ref 15–41)
Albumin: 3.9 g/dL (ref 3.5–5.0)
Alkaline Phosphatase: 39 U/L (ref 38–126)
Anion gap: 9 (ref 5–15)
BILIRUBIN TOTAL: 0.3 mg/dL (ref 0.3–1.2)
BUN: 16 mg/dL (ref 6–20)
CHLORIDE: 102 mmol/L (ref 101–111)
CO2: 28 mmol/L (ref 22–32)
CREATININE: 0.91 mg/dL (ref 0.44–1.00)
Calcium: 8.9 mg/dL (ref 8.9–10.3)
GFR calc Af Amer: >60 mL/min (ref >60)
GLUCOSE: 58 mg/dL — AB (ref 65–99)
Potassium: 4.5 mmol/L (ref 3.5–5.1)
Sodium: 139 mmol/L (ref 135–145)
TOTAL PROTEIN: 7.1 g/dL (ref 6.5–8.1)

## 2017-01-01 LAB — CBC
HEMATOCRIT: 45.6 % (ref 36.0–46.0)
Hemoglobin: 14.3 g/dL (ref 12.0–15.0)
MCH: 30.1 pg (ref 26.0–34.0)
MCHC: 31.4 g/dL (ref 30.0–36.0)
MCV: 96 fL (ref 78.0–100.0)
Platelets: 305 10*3/uL (ref 150–400)
RBC: 4.75 MIL/uL (ref 3.87–5.11)
RDW: 15.7 % — ABNORMAL HIGH (ref 11.5–15.5)
WBC: 7.9 10*3/uL (ref 4.0–10.5)

## 2017-01-01 LAB — BRAIN NATRIURETIC PEPTIDE: B Natriuretic Peptide: 234.5 pg/mL — ABNORMAL HIGH (ref 0.0–100.0)

## 2017-01-01 LAB — TSH: TSH: 4.964 u[IU]/mL — AB (ref 0.350–4.500)

## 2017-01-01 LAB — T4, FREE: Free T4: 1.08 ng/dL (ref 0.61–1.12)

## 2017-01-01 NOTE — Patient Instructions (Signed)
Labs today (will call for abnormal results, otherwise no news is good news)   Follow up in 3 Months 

## 2017-01-02 LAB — T3, FREE: T3 FREE: 2.4 pg/mL (ref 2.0–4.4)

## 2017-01-02 NOTE — Progress Notes (Signed)
Patient ID: Heidi Stark, female   DOB: 1940/11/19, 76 y.o.   MRN: [id number] PCP: Dr. [name] Cardiology: Dr. [name]  76 yo with history of HTN, DM, hyperlipidemia presents for cardiology followup.  She was admitted in 4/15 with splenic and renal infarcts.  She was found to have paroxysmal atrial fibrillation.  In 4/15, she had fever and flank pain.  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  TEE showed prominent aortic plaque.  While she was in the hospital, she was noted to have a brief run of atrial fibrillation and was started on coumadin.    She has left leg pain with ambulation after about 100 feet, LE dopplers 5/15 showed occluded left external iliac artery.  This has been present ever since she had left iliac damage with a prior back surgery.    She was stable until 1/18, when she developed palpitations and exertional dyspnea.  She was admitted to [facility] with atrial fibrillation/RVR and acute systolic CHF.  EF was found to be 30-35%, down from 50-55% in 2015.  She was diuresed and underwent DCCV back to NSR.  She subsequently had atrial fibrillation ablation in 4/18.  She remains in NSR today.  Last echo in 4/18 with EF back up to 50%.   She has had a hard time recently.  Developed cellulitis on her right lower leg with significant pain, treated with antibiotics.  Had a hard time walking.  This has resolved and she is doing better.  BP is high today in the office but SBP has been in the 120s-130s range at other recent appointments.  She has quit smoking and weight is up.  No exertional dyspnea, no chest pain, no orthopnea/PND.     Labs (4/15): K 4.1, creatinine 0.7, HCT 46.2 Labs (7/15): K 3.9, creatinine 0.8, HCT 50 Labs (12/15): K 5, creatinine 0.7, HDL 33, LDL 57 Labs (5/16): K 4, creatinine 0.84, TGs 256, HDL 29, LDL 42, HCT 41.2 Labs (1/18): K 3.4, creatinine 0.78 Labs (2/18): K 4.4, creatinine 0.8, free T3/T4 normal, mildly elevated TSH, LFTs  normal, hgb 15.6 Labs (4/18): K 4.2, creatinine 0.86, TSH elevated, free T3 and free T4 normal  ECG (personally reviewed): NSR, 1st degree AV block, lateral TWIs  PMH: 1.Type II diabetes 2. HTN 3. Hyperlipidemia 4. COPD 5. CAD: Coronary CTA (4/18) with nonobstructive coronary disease, calcium score 335 Agatston units (82nd percentile).  6. Paroxysmal atrial fibrillation: First noted in 4/15.  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   - Event monitor (4/15) with no atrial fibrillation.  - Admission with atrial fibrillation/RVR in 1/18.  - Atrial fibrillation ablation in 4/18.  7. Chronic systolic CHF: ?tachycardia-mediated as first noted in setting of atrial fibrillation with RVR.  - TEE (4/15) with EF 50-55%, mild LVH, grade III-IV plaque in descending thoracic aorta, RV normal, no PFO, peak RV-RA gradient 42 mmHg.   - Echo (1/18) with EF 30-35%, diffuse hypokinesis, moderate LVH, mildly dilated RV, PASP 55 mmHg.  - Echo (4/18) with EF 50%, anterolateral/inferolateral mild hypokinesis, mild aortic stenosis, IVC normal.  7. H/o diskectomy. 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 Lower extremity arterial dopplers (5/15) with occluded left external iliac artery.  - ABIs (5/18): 1.16 (normal) on right, 0.56 on left.  9. Aortic stenosis: Mild on 4/18 echo.   SH: Married, 2 kids, quit smoking in 2015.  FH: Daughter with factor  V Leiden deficiency.  Brother with CABG.  Sister with aneurysm.   ROS: All systems reviewed and negative except as per HPI.   Current Outpatient Prescriptions  Medication Sig Dispense Refill  . amiodarone (PACERONE) 200 MG tablet Take 1 tablet (200 mg total) by mouth daily. 30 tablet 3  . apixaban (ELIQUIS) 5 MG TABS tablet Take 1 tablet (5 mg total) by mouth 2 (two) times daily. 180 tablet 3  . ENTRESTO 24-26 MG TAKE 1 TABLET BY MOUTH 2 (TWO) TIMES DAILY. 180 tablet 3  . eplerenone (INSPRA) 25 MG  tablet Take 1 tablet (25 mg total) by mouth daily. 30 tablet 3  . fenofibrate 160 MG tablet TAKE 1 TABLET (160 MG TOTAL) BY MOUTH DAILY. 90 tablet 1  . furosemide (LASIX) 20 MG tablet TAKE 2 TABLETS IN THE MORNING AND 1 TABLET IN THE EVENING 90 tablet 1  . glimepiride (AMARYL) 2 MG tablet Take 2 mg by mouth as directed. Take 2 tablets every morning and 1 tablet tablets every evening.    Marland Kitchen glucose blood (ONETOUCH VERIO) test strip Use as directed once daily to check blood sugar  E11.9 100 each 6  . insulin glargine (LANTUS) 100 UNIT/ML injection Inject 10 Units into the skin at bedtime.    Marland Kitchen KLOR-CON 10 10 MEQ tablet TAKE 1 TABLET (10 MEQ TOTAL) BY MOUTH 2 (TWO) TIMES DAILY. 180 tablet 1  . metFORMIN (GLUCOPHAGE) 1000 MG tablet Take 1 tablet (1,000 mg total) by mouth 2 (two) times daily with a meal.    . metoprolol succinate (TOPROL-XL) 25 MG 24 hr tablet Take 1 tablet (25 mg total) by mouth 2 (two) times daily. 60 tablet 6  . polyethylene glycol (MIRALAX) packet Take 17 g by mouth daily as needed for mild constipation. 14 each 0   No current facility-administered medications for this encounter.     BP 130/65  Pulse 60  Ht 5\' 9"  (1.753 m)  Wt 111.131 kg (245 lb)  BMI 36.16 kg/m2 General: NAD Neck: JVP not elevated, no thyromegaly or thyroid nodule.  Lungs: CTAB CV: Nondisplaced PMI.  Heart regular S1/S2, no S3/S4, 2/6 early SEM.  1+ bilateral ankle edema.  No carotid bruit.  I am unable to palpate left PT pulse.  Abdomen: Soft, nontender, no hepatosplenomegaly, no distention.  Skin: Maculopapular erythematous rash   Neurologic: Alert and oriented x 3.  Psych: Normal affect. Extremities: No clubbing or cyanosis.  HEENT: Normal.   Assessment/Plan: 1. Atrial fibrillation: Paroxysmal.  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 She was admitted in 1/18 with atrial fibrillation/RVR and CHF.  She was diuresed and had DCCV back to NSR.  She had atrial fibrillation  ablation in 4/18 and is now in NSR.  - Continue amiodarone 200 mg daily. Check LFTs and thyroid indices today (has had elevated TSH with normal free T4 and free T3).  She will need regular eye exams.  Long-term, I would rather not have her on amiodarone, especially with COPD history.   - She will see Dr. [name] again in 7/18, hopefully can stop amiodarone at that point. - Continue Eliquis.    2. Claudication: Left leg claudication with absent left PT pulse.  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  She has an appointment with [facility].  3. Chronic systolic CHF: EF 29-79% on echo in 1/18, down from 50-55% in 4/15.  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  NYHA class II symptoms, she is not volume overloaded on exam.  - Continue Toprol XL 25 mg bid.  - Continue Entresto 24/26 bid.   - Continue Lasix 40 qam/20 qpm.  - ?rash from spironolactone, now on eplerenone 25 mg daily.    4. CAD: Coronary CTA showed coronary calcium score in the 82nd percentile with nonobstructive coronary disease.  - not on ASA 81 as she is taking Eliqius.  - Continue Crestor, check lipids next appointment.   5. COPD: She has quit smoking completely.   [name] 01/02/2017

## 2017-01-11 ENCOUNTER — Telehealth: Payer: Self-pay | Admitting: Cardiology

## 2017-01-11 ENCOUNTER — Other Ambulatory Visit (HOSPITAL_COMMUNITY): Payer: Self-pay | Admitting: Cardiology

## 2017-01-11 NOTE — Telephone Encounter (Signed)
I am ok with her stopping amiodarone at this point if ok with Dr. [name].

## 2017-01-11 NOTE — Telephone Encounter (Addendum)
She calls in reporting that she is having a time with her right leg -- "I  have cellulitis".  States that It swells and if she moves her ankle it feels like its going to bust. Reports that she is set up to see a vascular doctor in August. However, she is concerned about "skin peeling on my foot/leg" and that it only happens "when she is allergic to something".  She did some research and not sure if it is r/t Inspra or Amiodarone.  Informed pt that we may be stopping Amiodarone at her next OV w/ Camnitz next month.  (TSH elevated, COPD hx)  (Dr. [name] - refer to [address] note on 6/18). Explained that I am not sure what her option are r/t Inspra.  Made her aware I would forward this to both [name] and Camnitz to review/address. She understands someone will call her once advised on.

## 2017-01-11 NOTE — Telephone Encounter (Signed)
New messgae   Pt verbalized that she want rn to call he rback because eshe is allergic to one of her medications  Pt c/o medication issue:  1. Name of Medication: pt states she dont know  2. How are you currently taking this medication (dosage and times per day)? unknown  3. Are you having a reaction (difficulty breathing--STAT)? Didn't want to disclose any more information she wants rn to call her back about her medication  4. What is your medication issue? Didn't want to disclose any more information she wants rn to call her back about her medication

## 2017-01-12 NOTE — Telephone Encounter (Signed)
OK with stopping amiodarone.

## 2017-01-15 NOTE — Telephone Encounter (Signed)
Instructed to stop Amiodarone.  Advised to call office if she begins to experience issues w/ AFib after stopping, otherwise we will see her at her scheduled OV on 7/24.   Patient verbalized understanding and agreeable to plan.

## 2017-01-16 ENCOUNTER — Other Ambulatory Visit: Payer: Self-pay | Admitting: Cardiology

## 2017-02-06 ENCOUNTER — Ambulatory Visit (INDEPENDENT_AMBULATORY_CARE_PROVIDER_SITE_OTHER): Payer: Medicare Other | Admitting: Cardiology

## 2017-02-06 ENCOUNTER — Other Ambulatory Visit: Payer: Self-pay | Admitting: Family Medicine

## 2017-02-06 ENCOUNTER — Encounter: Payer: Self-pay | Admitting: Surgery

## 2017-02-06 ENCOUNTER — Encounter: Payer: Self-pay | Admitting: Cardiology

## 2017-02-06 VITALS — BP 136/80 | HR 60 | Ht 66.0 in | Wt 265.2 lb

## 2017-02-06 DIAGNOSIS — G4733 Obstructive sleep apnea (adult) (pediatric): Secondary | ICD-10-CM | POA: Diagnosis not present

## 2017-02-06 DIAGNOSIS — I481 Persistent atrial fibrillation: Secondary | ICD-10-CM

## 2017-02-06 DIAGNOSIS — I428 Other cardiomyopathies: Secondary | ICD-10-CM | POA: Diagnosis not present

## 2017-02-06 DIAGNOSIS — I739 Peripheral vascular disease, unspecified: Secondary | ICD-10-CM | POA: Diagnosis not present

## 2017-02-06 DIAGNOSIS — I4819 Other persistent atrial fibrillation: Secondary | ICD-10-CM

## 2017-02-06 DIAGNOSIS — E1151 Type 2 diabetes mellitus with diabetic peripheral angiopathy without gangrene: Secondary | ICD-10-CM

## 2017-02-06 NOTE — Progress Notes (Signed)
Electrophysiology Office Note   Date:  02/06/2017   ID:  Temica, Righetti 03-May-1941, MRN [id number]  PCP:  [name], [name], DO  Cardiologist:  [name] Primary Electrophysiologist:  Tyrian Peart [name], MD    Chief Complaint  Patient presents with  . Atrial Fibrillation     History of Present Illness: CRYSTALLEE WERDEN is a 76 y.o. female who presents today for electrophysiology evaluation.   History of hypertension, diabetes, COPD, and hyperlipidemia. She was admitted on 4/15 with splenic and renal infarcts and found to have paroxysmal atrial fibrillation.    Today, denies symptoms of palpitations, chest pain, shortness of breath, orthopnea, PND, lower extremity edema, claudication, dizziness, presyncope, syncope, bleeding, or neurologic sequela. The patient is tolerating medications without difficulties and is otherwise without complaint today.  S/p AF ablation 11/07/16. Had right groin hematoma after her sheath was pulled. This continuing to have right lower extremity swelling that is worse towards the end of the day. She has follow-up with vascular surgery upcoming.    Past Medical History:  Diagnosis Date  . Arthritis    "hands, wrists" (11/07/2016)  . CHF (congestive heart failure) ([place]   . Chronic lower back pain   . COPD (chronic obstructive pulmonary disease) ([place])   . DVT (deep venous thrombosis) ([place])   . History of blood transfusion 1990   "related to OR"  . History of kidney stones   . Hyperlipidemia   . Hypertension   . On home oxygen therapy    "2L w/CPAP when I sleep" (11/07/2016)  . OSA on CPAP   . PAF (paroxysmal atrial fibrillation) ([place])   . Pneumonia    "several times" (11/07/2016)  . Type II diabetes mellitus ([place])    Past Surgical History:  Procedure Laterality Date  . ATRIAL FIBRILLATION ABLATION N/A 11/07/2016   Procedure: Atrial Fibrillation Ablation;  Surgeon: Asheley Hellberg [name], MD;  Location: [facility];  Service: Cardiovascular;   Laterality: N/A;  . BACK SURGERY    . CARDIAC CATHETERIZATION    . CARDIOVERSION N/A 08/11/2016   Procedure: CARDIOVERSION;  Surgeon: Larey Dresser, MD;  Location: [facility];  Service: Cardiovascular;  Laterality: N/A;  . CATARACT EXTRACTION W/ INTRAOCULAR LENS  IMPLANT, BILATERAL Bilateral   . COLON SURGERY  1990   vein graft and colon repair after nicked artery with back surgert  . COLONOSCOPY    . CYSTECTOMY     between bladder and kidneys  . GANGLION CYST EXCISION Left   . KNEE CARTILAGE SURGERY Right 1960s  . [place] SURGERY  1990  . REPAIR ILIAC ARTERY  1990   vein graft and colon repair after nicked artery with back surgert  . TEE WITHOUT CARDIOVERSION N/A 10/31/2013   Procedure: TRANSESOPHAGEAL ECHOCARDIOGRAM (TEE);  Surgeon: Larey Dresser, MD;  Location: [facility];  Service: Cardiovascular;  Laterality: N/A;  . TUBAL LIGATION       Current Outpatient Prescriptions  Medication Sig Dispense Refill  . ELIQUIS 5 MG TABS tablet TAKE 1 TABLET (5 MG TOTAL) BY MOUTH 2 (TWO) TIMES DAILY. 180 tablet 3  . ENTRESTO 24-26 MG TAKE 1 TABLET BY MOUTH 2 (TWO) TIMES DAILY. 180 tablet 3  . eplerenone (INSPRA) 25 MG tablet TAKE 1 TABLET (25 MG TOTAL) BY MOUTH DAILY. 90 tablet 3  . fenofibrate 160 MG tablet TAKE 1 TABLET (160 MG TOTAL) BY MOUTH DAILY. 90 tablet 1  . furosemide (LASIX) 20 MG tablet TAKE 2 TABLETS IN  THE MORNING AND 1 TABLET IN THE EVENING 90 tablet 1  . glimepiride (AMARYL) 2 MG tablet Take 2 mg by mouth as directed. Take 2 tablets every morning and 1 tablet tablets every evening.    Marland Kitchen glucose blood (ONETOUCH VERIO) test strip Use as directed once daily to check blood sugar  E11.9 100 each 6  . insulin glargine (LANTUS) 100 UNIT/ML injection Inject 10 Units into the skin at bedtime.    Marland Kitchen KLOR-CON 10 10 MEQ tablet TAKE 1 TABLET (10 MEQ TOTAL) BY MOUTH 2 (TWO) TIMES DAILY. 180 tablet 1  . metFORMIN (GLUCOPHAGE) 1000 MG tablet Take 1 tablet (1,000 mg total) by mouth 2  (two) times daily with a meal.    . metoprolol succinate (TOPROL-XL) 25 MG 24 hr tablet Take 1 tablet (25 mg total) by mouth 2 (two) times daily. 60 tablet 6  . polyethylene glycol (MIRALAX) packet Take 17 g by mouth daily as needed for mild constipation. 14 each 0   No current facility-administered medications for this visit.     Allergies:   Neomycin-bacitracin zn-polymyx; Sulfonamide derivatives; Ciprofloxacin; and Spironolactone   Social History:  The patient  reports that she quit smoking about 3 months ago. Her smoking use included Cigarettes. She has a 45.00 pack-year smoking history. She has never used smokeless tobacco. She reports that she does not drink alcohol or use drugs.   Family History:  The patient's family history includes Diabetes in her unknown relative; Factor V Leiden deficiency in her daughter; Melanoma in her unknown relative.    ROS:  Please see the history of present illness.   Otherwise, review of systems is positive for leg swelling.   All other systems are reviewed and negative.   PHYSICAL EXAM: VS:  BP 136/80   Pulse 60   Ht 5\' 6"  (1.676 m)   Wt 265 lb 4 oz (120.3 kg)   SpO2 96%   BMI 42.81 kg/m  , BMI Body mass index is 42.81 kg/m. GEN: Well nourished, well developed, in no acute distress  HEENT: normal  Neck: no JVD, carotid bruits, or masses Cardiac: RRR; no murmurs, rubs, or gallops,no edema  Respiratory:  clear to auscultation bilaterally, normal work of breathing GI: soft, nontender, nondistended, + BS MS: no deformity or atrophy  Skin: warm and dry Neuro:  Strength and sensation are intact Psych: euthymic mood, full affect  EKG:  EKG is ordered today. Personal review of the ekg ordered shows sinus rhythm, 1 degree AV block, PRWP, rate 60   Recent Labs: 08/11/2016: Magnesium 2.4 01/01/2017: ALT 28; B Natriuretic Peptide 234.5; BUN 16; Creatinine, Ser 0.91; Hemoglobin 14.3; Platelets 305; Potassium 4.5; Sodium 139; TSH 4.964    Lipid Panel      Component Value Date/Time   CHOL 112 10/11/2015 1012   TRIG 187.0 (H) 10/11/2015 1012   HDL 28.90 (L) 10/11/2015 1012   CHOLHDL 4 10/11/2015 1012   VLDL 37.4 10/11/2015 1012   LDLCALC 46 10/11/2015 1012   LDLDIRECT 42.0 11/30/2014 1226     Wt Readings from Last 3 Encounters:  02/06/17 265 lb 4 oz (120.3 kg)  01/01/17 262 lb 8 oz (119.1 kg)  12/19/16 261 lb (118.4 kg)    Other studies Reviewed: Additional studies/ records that were reviewed today include: TTE 11/01/16 Review of the above records today demonstrates:  - Left ventricle: The cavity size was normal. Wall thickness was   increased in a pattern of mild LVH. The estimated ejection  fraction was 50%. Doppler parameters are consistent with abnormal   left ventricular relaxation (grade 1 diastolic dysfunction). - Aortic valve: There was very mild stenosis. Mean gradient (S): 11   mm Hg. Peak gradient (S): 21 mm Hg. Valve area (VTI): 1.78 cm^2. - Mitral valve: Moderately calcified annulus. There was no   significant regurgitation. - Left atrium: The atrium was mildly to moderately dilated. - Right ventricle: The cavity size was normal. Systolic function   was normal. - Pulmonary arteries: No complete TR doppler jet so unable to   estimate PA systolic pressure. - Inferior vena cava: The vessel was normal in size. The   respirophasic diameter changes were in the normal range (= 50%),   consistent with normal central venous pressure.   ASSESSMENT AND PLAN:  1.  Paroxysmal atrial fibrillation: On amiodarone and eliquis. S/p ablation 11/07/16. remains in sinus rhythm today. No changes at this time.  This patients CHA2DS2-VASc Score and unadjusted Ischemic Stroke Rate (% per year) is equal to 4.8 % stroke rate/year from a score of 4  Above score calculated as 1 point each if present [CHF, HTN, DM, Vascular=MI/PAD/Aortic Plaque, Age if 65-74, or Female] Above score calculated as 2 points each if present [Age > 75, or  Stroke/TIA/TE]  2. Chronic systolic heart failure: ejection fraction improved with maintenance of sinus rhythm. Status post atrial fibrillation ablation. In sinus rhythm today. Continue current heart failure medications.   3. Claudication: absent PT pulse, potentially related to prior back surgery on the left. Has follow-up with vascular surgery   4. Obstructive sleep apnea:  compliant with CPAP.  Current medicines are reviewed at length with the patient today.   The patient does not have concerns regarding her medicines.  The following changes were made today:  none  Labs/ tests ordered today include:  No orders of the defined types were placed in this encounter.    Disposition:   FU with Cataldo Cosgriff 3 months  Signed, Waldine Zenz [name], MD  02/06/2017 11:41 AM     [facility] [address] [place] [address] [phone] (office) [phone] (fax)

## 2017-02-06 NOTE — Patient Instructions (Signed)
Medication Instructions:    Your physician recommends that you continue on your current medications as directed. Please refer to the Current Medication list given to you today.  - If you need a refill on your cardiac medications before your next appointment, please call your pharmacy.   Labwork:  None ordered  Testing/Procedures:  None ordered  Follow-Up:  Your physician recommends that you schedule a follow-up appointment in: 3 months with Dr. Camnitz.  Thank you for choosing [facility]!!   Lisa-Marie Rueger, RN [phone]         

## 2017-02-07 MED ORDER — METFORMIN HCL 1000 MG PO TABS: 1000.0000 mg | ORAL_TABLET | Freq: Two times a day (BID) | ORAL | 5 refills | Status: DC

## 2017-02-11 ENCOUNTER — Other Ambulatory Visit: Payer: Self-pay | Admitting: Family Medicine

## 2017-02-14 ENCOUNTER — Telehealth: Payer: Self-pay | Admitting: Family Medicine

## 2017-02-14 NOTE — Telephone Encounter (Signed)
Caller name: Heidi Stark Relationship to patient: self Can be reached: [phone]  Reason for call: Pt states she talked to Dr. [name] about getting samples of Eliquis. She said Dr. [name] told her she was going to talk to drug rep. Pt would like call back to see if they can help with samples.

## 2017-02-14 NOTE — Telephone Encounter (Signed)
Advised patient that we do not have any in stock yet.  She is still awaiting a response for help with medication.  Advised patient to call to keep checking.

## 2017-02-15 ENCOUNTER — Encounter (HOSPITAL_COMMUNITY): Payer: Medicare Other

## 2017-02-15 ENCOUNTER — Encounter: Payer: Medicare Other | Admitting: Vascular Surgery

## 2017-02-16 ENCOUNTER — Other Ambulatory Visit: Payer: Self-pay | Admitting: Cardiology

## 2017-02-16 ENCOUNTER — Telehealth (HOSPITAL_COMMUNITY): Payer: Self-pay | Admitting: Vascular Surgery

## 2017-02-16 ENCOUNTER — Telehealth: Payer: Self-pay | Admitting: Family Medicine

## 2017-02-16 MED ORDER — FUROSEMIDE 20 MG PO TABS: ORAL_TABLET | ORAL | 4 refills | Status: DC

## 2017-02-16 MED ORDER — APIXABAN 5 MG PO TABS: 5.0000 mg | ORAL_TABLET | Freq: Two times a day (BID) | ORAL | 3 refills | Status: DC

## 2017-02-16 NOTE — Telephone Encounter (Signed)
Pt called in to follow up on refill request. She apologize repeat ly, pt says that its her fault she didn't realize that she would be out this weekend.    Please assist further.

## 2017-02-16 NOTE — Addendum Note (Signed)
Addended by: Sharon Seller B on: 02/16/2017 02:03 PM   Modules accepted: Orders

## 2017-02-16 NOTE — Telephone Encounter (Signed)
Patient requested her furosemide to be refilled/notified her and did refill to her local pharmacy.

## 2017-02-16 NOTE — Telephone Encounter (Signed)
Pt needs refill Eliquis sent to [facility], PT states she called [facility] to [facility], she was told prescription had expired. Pt has one pill left  please advise

## 2017-02-26 ENCOUNTER — Encounter: Payer: Self-pay | Admitting: Surgery

## 2017-02-26 ENCOUNTER — Ambulatory Visit (HOSPITAL_COMMUNITY)
Admission: RE | Admit: 2017-02-26 | Discharge: 2017-02-26 | Disposition: A | Discharge status: Home / Self Care | Payer: Medicare Other | Source: Ambulatory Visit | Attending: Surgery | Admitting: Surgery

## 2017-02-26 ENCOUNTER — Encounter (HOSPITAL_COMMUNITY): Payer: Self-pay

## 2017-02-26 ENCOUNTER — Ambulatory Visit (INDEPENDENT_AMBULATORY_CARE_PROVIDER_SITE_OTHER): Payer: Medicare Other | Admitting: Surgery

## 2017-02-26 VITALS — BP 115/73 | HR 95 | Temp 97.8°F | Resp 16 | Ht 64.5 in | Wt 265.1 lb

## 2017-02-26 DIAGNOSIS — I7092 Chronic total occlusion of artery of the extremities: Secondary | ICD-10-CM

## 2017-02-26 DIAGNOSIS — M7989 Other specified soft tissue disorders: Secondary | ICD-10-CM | POA: Diagnosis not present

## 2017-02-26 NOTE — Progress Notes (Signed)
[facility] [place]  Patient name: Heidi Stark MRN: [id number] DOB: 07-21-1940 Sex: female   REQUESTING PROVIDER:    dr. [name]   REASON FOR CONSULT:    Right leg cellulitis  HISTORY OF PRESENT ILLNESS:   Heidi Stark is a 76 y.o. female, who is Referred today for evaluation of right leg swelling and redness.  The patient states that she underwent cardiac ablation for atrial fibrillation in April.  This was successful.  She does take Eliquis.  Her procedure was complicated by a hematoma.  She states that pressure was held for approximately 3-4 hours.  She was ultimately able to be discharged home.  About a week later she developed cellulitis in the right leg.  She underwent 2 courses of antibiotic therapy.  She continues to have some discoloration and mild swelling in the right leg.  She has not worn compression stockings.  The patient underwent ultrasound evaluation of the venous system in May.  This was negative for DVT.  The great saphenous system was noted to be competent.  Her ankle-brachial indices are 1.1 on the right and 0.62 on the left.  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  The patient is a diabetic.  Her most recent hemoglobin A1c was 6.0.  She takes a statin for hypercholesterolemia.  She is on an ARB for hypertension.  She is a former smoker.  PAST MEDICAL HISTORY    Past Medical History:  Diagnosis Date  . Arthritis    "hands, wrists" (11/07/2016)  . CHF (congestive heart failure) ([facility] [place])   . Chronic lower back pain   . COPD (chronic obstructive pulmonary disease) ([place])   . DVT (deep venous thrombosis) ([place])   . History of blood transfusion 1990   "related to OR"  . History of kidney stones   . Hyperlipidemia   . Hypertension   . On home oxygen therapy    "2L w/CPAP when I sleep" (11/07/2016)  . OSA on CPAP   . PAF (paroxysmal atrial  fibrillation) ([facility])   . Pneumonia    "several times" (11/07/2016)  . Type II diabetes mellitus ([place])      FAMILY HISTORY   Family History  Problem Relation Age of Onset  . Diabetes Unknown   . Melanoma Unknown   . Factor V Leiden deficiency Daughter     SOCIAL HISTORY:   Social History   Social History  . Marital status: Married    Spouse name: N/A  . Number of children: 2  . Years of education: N/A   Occupational History  . Retired    Social History Main Topics  . Smoking status: Former Smoker    Packs/day: 1.00    Years: 45.00    Types: Cigarettes    Quit date: 10/15/2016  . Smokeless tobacco: Never Used  . Alcohol use No  . Drug use: No  . Sexual activity: Not on file   Other Topics Concern  . Not on file   Social History Narrative  . No narrative on file    ALLERGIES:    Allergies  Allergen Reactions  . Neomycin-Bacitracin Zn-Polymyx Rash  . Sulfonamide Derivatives Other (See Comments)    Stomach cramps  . Ciprofloxacin Hives, Itching and Rash  . Spironolactone Rash    CURRENT MEDICATIONS:    Current Outpatient Prescriptions  Medication Sig Dispense Refill  . apixaban (ELIQUIS) 5 MG TABS tablet Take  1 tablet (5 mg total) by mouth 2 (two) times daily. 180 tablet 3  . ELIQUIS 5 MG TABS tablet TAKE 1 TABLET (5 MG TOTAL) BY MOUTH 2 (TWO) TIMES DAILY. 180 tablet 3  . ENTRESTO 24-26 MG TAKE 1 TABLET BY MOUTH 2 (TWO) TIMES DAILY. 180 tablet 3  . eplerenone (INSPRA) 25 MG tablet TAKE 1 TABLET (25 MG TOTAL) BY MOUTH DAILY. 90 tablet 3  . fenofibrate 160 MG tablet TAKE 1 TABLET (160 MG TOTAL) BY MOUTH DAILY. 90 tablet 1  . furosemide (LASIX) 20 MG tablet TAKE 2 TABLETS IN THE MORNING AND 1 TABLET IN THE EVENING 90 tablet 1  . furosemide (LASIX) 20 MG tablet TAKE 2 TABLETS IN THE MORNING AND 1 TABLET IN THE EVENING 90 tablet 4  . glimepiride (AMARYL) 2 MG tablet Take 2 mg by mouth as directed. Take 2 tablets every morning and 1 tablet tablets every  evening.    Marland Kitchen glucose blood (ONETOUCH VERIO) test strip Use as directed once daily to check blood sugar  E11.9 100 each 6  . insulin glargine (LANTUS) 100 UNIT/ML injection Inject 10 Units into the skin at bedtime.    Marland Kitchen KLOR-CON 10 10 MEQ tablet TAKE 1 TABLET (10 MEQ TOTAL) BY MOUTH 2 (TWO) TIMES DAILY. 180 tablet 1  . metFORMIN (GLUCOPHAGE) 1000 MG tablet Take 1 tablet (1,000 mg total) by mouth 2 (two) times daily with a meal. 60 tablet 5  . metoprolol succinate (TOPROL-XL) 25 MG 24 hr tablet Take 1 tablet (25 mg total) by mouth 2 (two) times daily. 60 tablet 6  . polyethylene glycol (MIRALAX) packet Take 17 g by mouth daily as needed for mild constipation. 14 each 0   No current facility-administered medications for this visit.     REVIEW OF SYSTEMS:   [X]  denotes positive finding, [ ]  denotes negative finding Cardiac  Comments:  Chest pain or chest pressure:    Shortness of breath upon exertion: x   Short of breath when lying flat:    Irregular heart rhythm: x       Vascular    Pain in calf, thigh, or hip brought on by ambulation: x   Pain in feet at night that wakes you up from your sleep:     Blood clot in your veins:    Leg swelling:  x       Pulmonary    Oxygen at home: x   Productive cough:     Wheezing:         Neurologic    Sudden weakness in arms or legs:     Sudden numbness in arms or legs:     Sudden onset of difficulty speaking or slurred speech:    Temporary loss of vision in one eye:     Problems with dizziness:         Gastrointestinal    Blood in stool:      Vomited blood:         Genitourinary    Burning when urinating:     Blood in urine:        Psychiatric    Major depression:         Hematologic    Bleeding problems:    Problems with blood clotting too easily:        Skin    Rashes or ulcers:        Constitutional    Fever or chills:     PHYSICAL EXAM:  Vitals:   02/26/17 1455  BP: 115/73  Pulse: 95  Resp: 16  Temp: 97.8 F  (36.6 C)  TempSrc: Oral  SpO2: 98%  Weight: 265 lb 1.6 oz (120.2 kg)  Height: 5' 4.5" (1.638 m)    GENERAL: The patient is a well-nourished female, in no acute distress. The vital signs are documented above. CARDIAC: There is a regular rate and rhythm.  VASCULAR: I cannot palpate pedal pulses.  Trace edema to the right leg.  1+ pitting edema to the left leg. PULMONARY: Nonlabored respirations ABDOMEN: Soft and non-tender with normal pitched bowel sounds.  MUSCULOSKELETAL: There are no major deformities or cyanosis. NEUROLOGIC: No focal weakness or paresthesias are detected. SKIN: Slight erythema to the right foot which blanches with palpation.  The area is not warm. PSYCHIATRIC: The patient has a normal affect.  STUDIES:   I have reviewed the vascular lab studies from [facility] from May.  This shows no evidence of DVT and a competent right great saphenous vein.  ABIs were performed which are normal on the right and 0.6 on the left  ASSESSMENT and PLAN   Right leg swelling and discoloration: After reviewing her previous ultrasounds, this shows a competent saphenous vein and no evidence of DVT.  I do not think this needs to be repeated.  With regards to her swelling, she would benefit from leg elevation and compression stockings.  I have Given her information regarding obtaining compression stockings.  No venous intervention is warranted.  Decreased ABIs on the left leg or a chronic finding.  She is undergone iliac artery intervention by Dr. Truman Hayward in the 1990s following iliac artery injury from back surgery.  She is relatively asymptomatic.  She does not wish to have anything done to this.   Annamarie Major, MD [facility] [phone] Pager [phone]

## 2017-03-05 ENCOUNTER — Telehealth (HOSPITAL_COMMUNITY): Payer: Self-pay | Admitting: Pharmacist

## 2017-03-05 NOTE — Telephone Encounter (Signed)
Heidi Stark called again stating that her Entresto, Eliquis and Lantus together cost >$800 and she cannot afford these. I had her enrolled in the [employer] foundation to help with her cost of Entresto but she used her grant and it has unfortunately recently run out of funding. Hopefully will reopen soon so I can re-enroll her. Will provide with samples in the meantime. Also, she was given the [employer] patient assistance application for her Eliquis which we never received back from her. Left her a VM to call us back so that we can assist her with the costs of these medications.   Ruta Hinds. Velva Harman, PharmD, BCPS, CPP Clinical Pharmacist Pager: [phone] Phone: [phone] 03/05/2017 1:36 PM

## 2017-03-06 ENCOUNTER — Other Ambulatory Visit (HOSPITAL_COMMUNITY): Payer: Self-pay | Admitting: Pharmacist

## 2017-03-06 MED ORDER — SACUBITRIL-VALSARTAN 24-26 MG PO TABS: 1.0000 | ORAL_TABLET | Freq: Two times a day (BID) | ORAL | 3 refills | Status: DC

## 2017-03-08 ENCOUNTER — Telehealth: Payer: Self-pay | Admitting: *Deleted

## 2017-03-08 NOTE — Telephone Encounter (Signed)
Received fax from [place] stating they have received application regarding assistance for [facility], a decision will be within 2 business days.

## 2017-03-13 ENCOUNTER — Telehealth: Payer: Self-pay | Admitting: Family Medicine

## 2017-03-13 NOTE — Telephone Encounter (Signed)
Caller name: Azoria Relation to pt: self  Call back number: [phone] Pharmacy:  Reason for call: Pt requesting if possible to get samples of ELIQUIS, pt states her insurance does not cover and that she is having issues with paying a lot of other meds for herself and spouse (spouse was diagnosed cancer). Please advise.

## 2017-03-13 NOTE — Telephone Encounter (Signed)
Spoke w/ Pt- informed samples have been placed at front desk (14 days worth). Patient Assistance paperwork for medication has been completed through cardiology- awaiting response.

## 2017-04-10 ENCOUNTER — Other Ambulatory Visit (HOSPITAL_COMMUNITY): Payer: Self-pay | Admitting: Cardiology

## 2017-04-13 ENCOUNTER — Other Ambulatory Visit (HOSPITAL_COMMUNITY): Payer: Self-pay | Admitting: Cardiology

## 2017-05-03 ENCOUNTER — Encounter (HOSPITAL_COMMUNITY): Payer: Self-pay

## 2017-05-03 ENCOUNTER — Telehealth (HOSPITAL_COMMUNITY): Payer: Self-pay | Admitting: *Deleted

## 2017-05-03 NOTE — Telephone Encounter (Signed)
Eliquis approved for patient assistance from 05/02/2017-07/16/17. Approval #[id number]

## 2017-06-08 ENCOUNTER — Telehealth: Payer: Self-pay | Admitting: Family Medicine

## 2017-06-08 ENCOUNTER — Other Ambulatory Visit: Payer: Self-pay

## 2017-06-08 MED ORDER — POTASSIUM CHLORIDE ER 10 MEQ PO TBCR: EXTENDED_RELEASE_TABLET | ORAL | 1 refills | Status: DC

## 2017-06-08 NOTE — Telephone Encounter (Signed)
Copied from [place]. Topic: Quick Communication - See Telephone Encounter >> Jun 08, 2017 12:45 PM Lennox Solders wrote: CRM for notification. See Telephone encounter for: pt needs refill on klor-con 10 meq . [place] [phone]

## 2017-06-18 ENCOUNTER — Other Ambulatory Visit: Payer: Self-pay | Admitting: Family Medicine

## 2017-07-14 ENCOUNTER — Encounter: Payer: Self-pay | Admitting: Nurse Practitioner

## 2017-07-14 ENCOUNTER — Ambulatory Visit: Payer: Medicare Other | Admitting: Nurse Practitioner

## 2017-07-14 VITALS — BP 126/74 | HR 97 | Wt 263.0 lb

## 2017-07-14 DIAGNOSIS — J40 Bronchitis, not specified as acute or chronic: Secondary | ICD-10-CM | POA: Diagnosis not present

## 2017-07-14 MED ORDER — PREDNISONE 10 MG (21) PO TBPK: ORAL_TABLET | ORAL | 0 refills | Status: DC

## 2017-07-14 MED ORDER — METHYLPREDNISOLONE ACETATE 40 MG/ML IJ SUSP
40.0000 mg | Freq: Once | INTRAMUSCULAR | Status: AC
  Administered 2017-07-14: 40 mg via INTRAMUSCULAR

## 2017-07-14 MED ORDER — ALBUTEROL SULFATE (2.5 MG/3ML) 0.083% IN NEBU
2.5000 mg | INHALATION_SOLUTION | Freq: Once | RESPIRATORY_TRACT | Status: AC
  Administered 2017-07-14: 2.5 mg via RESPIRATORY_TRACT

## 2017-07-14 MED ORDER — AZITHROMYCIN 250 MG PO TABS: ORAL_TABLET | ORAL | 0 refills | Status: DC

## 2017-07-14 NOTE — Patient Instructions (Addendum)
I have sent a prescription for prednisone and a z-pak to treat you for a lung infection.  Please take medications as instructed on bottles.  Please follow up if you develop high fevers, if you start to feel worse, or if no improvement by Monday.  I hope you feel better. Thanks for letting me take care of you today :)   Acute Bronchitis, Adult Acute bronchitis is when air tubes (bronchi) in the lungs suddenly get swollen. The condition can make it hard to breathe. It can also cause these symptoms:  A cough.  Coughing up clear, yellow, or green mucus.  Wheezing.  Chest congestion.  Shortness of breath.  A fever.  Body aches.  Chills.  A sore throat.  Follow these instructions at home: Medicines  Take over-the-counter and prescription medicines only as told by your doctor.  If you were prescribed an antibiotic medicine, take it as told by your doctor. Do not stop taking the antibiotic even if you start to feel better. General instructions  Rest.  Drink enough fluids to keep your pee (urine) clear or pale yellow.  Avoid smoking and secondhand smoke. If you smoke and you need help quitting, ask your doctor. Quitting will help your lungs heal faster.  Use an inhaler, cool mist vaporizer, or humidifier as told by your doctor.  Keep all follow-up visits as told by your doctor. This is important. How is this prevented? To lower your risk of getting this condition again:  Wash your hands often with soap and water. If you cannot use soap and water, use hand sanitizer.  Avoid contact with people who have cold symptoms.  Try not to touch your hands to your mouth, nose, or eyes.  Make sure to get the flu shot every year.  Contact a doctor if:  Your symptoms do not get better in 2 weeks. Get help right away if:  You cough up blood.  You have chest pain.  You have very bad shortness of breath.  You become dehydrated.  You faint (pass out) or keep feeling like  you are going to pass out.  You keep throwing up (vomiting).  You have a very bad headache.  Your fever or chills gets worse. This information is not intended to replace advice given to you by your health care provider. Make sure you discuss any questions you have with your health care provider. Document Released: 12/20/2007 Document Revised: 02/09/2016 Document Reviewed: 12/22/2015 Elsevier Interactive Patient Education  [employer].

## 2017-07-14 NOTE — Progress Notes (Signed)
Subjective:    Patient ID: Heidi Stark, female    DOB: 1940-10-20, 76 y.o.   MRN: [id number]  HPI Heidi Stark is a 76 yo female who presents today for an acute visit. She has chief complaint of shortness of breath  This is an acute problem This began on Thursday. The shortness of breath has not improved since onset. She c/o cough with clear to yellow sputum, and shortness of breath worsening at night. She was afraid to sleep last night because she felt so short of breath. She reports fatigue. She does have some chronic lower extremity edema, which is not noticed today. She denies headaches, fevers, chest pain, palpitations She wears o2 with her cpap to sleep at night, but used the oxygen by itself today because she felt sob. She tried her albuterol inhaler at home with no improvement.  Review of Systems  See HPI  Past Medical History:  Diagnosis Date  . Arthritis    "hands, wrists" (11/07/2016)  . CHF (congestive heart failure) ([place])   . Chronic lower back pain   . COPD (chronic obstructive pulmonary disease) ([place])   . DVT (deep venous thrombosis) ([place])   . History of blood transfusion 1990   "related to OR"  . History of kidney stones   . Hyperlipidemia   . Hypertension   . On home oxygen therapy    "2L w/CPAP when I sleep" (11/07/2016)  . OSA on CPAP   . PAF (paroxysmal atrial fibrillation) ([place])   . Pneumonia    "several times" (11/07/2016)  . Type II diabetes mellitus ([place])      Social History   Socioeconomic History  . Marital status: Married    Spouse name: Not on file  . Number of children: 2  . Years of education: Not on file  . Highest education level: Not on file  Social Needs  . Financial resource strain: Not on file  . Food insecurity - worry: Not on file  . Food insecurity - inability: Not on file  . Transportation needs - medical: Not on file  . Transportation needs - non-medical: Not on file  Occupational History  . Occupation: Retired    Tobacco Use  . Smoking status: Former Smoker    Packs/day: 1.00    Years: 45.00    Pack years: 45.00    Types: Cigarettes    Last attempt to quit: 10/15/2016    Years since quitting: 0.7  . Smokeless tobacco: Never Used  Substance and Sexual Activity  . Alcohol use: No    Alcohol/week: 0.0 oz  . Drug use: No  . Sexual activity: Not on file  Other Topics Concern  . Not on file  Social History Narrative  . Not on file    Past Surgical History:  Procedure Laterality Date  . ATRIAL FIBRILLATION ABLATION N/A 11/07/2016   Procedure: Atrial Fibrillation Ablation;  Surgeon: [name], MD;  Location: [facility];  Service: Cardiovascular;  Laterality: N/A;  . BACK SURGERY    . CARDIAC CATHETERIZATION    . CARDIOVERSION N/A 08/11/2016   Procedure: CARDIOVERSION;  Surgeon: Larey Dresser, MD;  Location: [facility];  Service: Cardiovascular;  Laterality: N/A;  . CATARACT EXTRACTION W/ INTRAOCULAR LENS  IMPLANT, BILATERAL Bilateral   . COLON SURGERY  1990   vein graft and colon repair after nicked artery with back surgert  . COLONOSCOPY    . CYSTECTOMY     between bladder and kidneys  .  GANGLION CYST EXCISION Left   . KNEE CARTILAGE SURGERY Right 1960s  . [place] SURGERY  1990  . REPAIR ILIAC ARTERY  1990   vein graft and colon repair after nicked artery with back surgert  . TEE WITHOUT CARDIOVERSION N/A 10/31/2013   Procedure: TRANSESOPHAGEAL ECHOCARDIOGRAM (TEE);  Surgeon: Larey Dresser, MD;  [facility];  Service: Cardiovascular;  Laterality: N/A;  . TUBAL LIGATION      Family History  Problem Relation Age of Onset  . Diabetes Unknown   . Melanoma Unknown   . Factor V Leiden deficiency Daughter     Allergies  Allergen Reactions  . Neomycin-Bacitracin Zn-Polymyx Rash  . Sulfonamide Derivatives Other (See Comments)    Stomach cramps  . Ciprofloxacin Hives, Itching and Rash  . Spironolactone Rash    Current Outpatient Medications on  File Prior to Visit  Medication Sig Dispense Refill  . apixaban (ELIQUIS) 5 MG TABS tablet Take 1 tablet (5 mg total) by mouth 2 (two) times daily. 180 tablet 3  . eplerenone (INSPRA) 25 MG tablet TAKE 1 TABLET (25 MG TOTAL) BY MOUTH DAILY. 90 tablet 3  . fenofibrate 160 MG tablet TAKE 1 TABLET BY MOUTH EVERY DAY 90 tablet 1  . furosemide (LASIX) 20 MG tablet TAKE 2 TABLETS IN THE MORNING AND 1 TABLET IN THE EVENING 90 tablet 4  . glimepiride (AMARYL) 2 MG tablet Take 2 mg by mouth as directed. Take 2 tablets every morning and 1 tablet tablets every evening.    Marland Kitchen glucose blood (ONETOUCH VERIO) test strip Use as directed once daily to check blood sugar  E11.9 100 each 6  . insulin glargine (LANTUS) 100 UNIT/ML injection Inject 10 Units into the skin at bedtime.    . metFORMIN (GLUCOPHAGE) 1000 MG tablet Take 1 tablet (1,000 mg total) by mouth 2 (two) times daily with a meal. 60 tablet 5  . metoprolol succinate (TOPROL-XL) 25 MG 24 hr tablet TAKE 1 TABLET (25 MG TOTAL) BY MOUTH 2 (TWO) TIMES DAILY. 60 tablet 6  . metoprolol succinate (TOPROL-XL) 25 MG 24 hr tablet TAKE 1 TABLET (25 MG TOTAL) BY MOUTH 2 (TWO) TIMES DAILY. 60 tablet 6  . polyethylene glycol (MIRALAX) packet Take 17 g by mouth daily as needed for mild constipation. 14 each 0  . potassium chloride (KLOR-CON 10) 10 MEQ tablet TAKE 1 TABLET (10 MEQ TOTAL) BY MOUTH 2 (TWO) TIMES DAILY. 180 tablet 1  . sacubitril-valsartan (ENTRESTO) 24-26 MG Take 1 tablet by mouth 2 (two) times daily. 180 tablet 3   No current facility-administered medications on file prior to visit.     BP 126/74   Pulse 97   Wt 263 lb (119.3 kg)   SpO2 93%   BMI 44.45 kg/m      Objective:   Physical Exam  Constitutional: She is oriented to person, place, and time. She appears well-developed and well-nourished.  HENT:  Head: Normocephalic and atraumatic.  Cardiovascular: Normal rate, normal heart sounds and intact distal pulses.  Pulmonary/Chest: She has  wheezes.  Mild dyspnea with exertion. Wheezing throughout all lung fields, improved but remains after neb treamtent.  Musculoskeletal: She exhibits no edema.  Neurological: She is alert and oriented to person, place, and time. Coordination normal.  Skin: Skin is warm and dry.  Redness to bilateral lower extremities from knees to feet, no warmth, no tenderness  Psychiatric: She has a normal mood and affect. Judgment and thought content normal.  Assessment & Plan:   Bronchitis Nebulizer tx and steroid injection given in the office. She does feel better after nebulizer treatment. We will treat for infection with steroid taper, z-pak. Medications ordered: - predniSONE (STERAPRED UNI-PAK 21 TAB) 10 MG (21) TBPK tablet; Take 6, 5, 4, 3, 2, 1.  Dispense: 21 tablet; Refill: 0 - azithromycin (ZITHROMAX) 250 MG tablet; Take 2 tablets first day, 1 tablet every day after until complete  Dispense: 6 tablet; Refill: 0 - albuterol (PROVENTIL) (2.5 MG/3ML) 0.083% nebulizer solution 2.5 mg - methylPREDNISolone acetate (DEPO-MEDROL) injection 40 mg Instructions given to follow up for blood sugar readings over 200. Strict return precautions given including immediately going to ER for worsening shortness of breath or chest pain. Pt education handout given.

## 2017-07-19 ENCOUNTER — Ambulatory Visit: Payer: Medicare Other | Admitting: Family Medicine

## 2017-07-19 ENCOUNTER — Ambulatory Visit: Payer: Self-pay | Admitting: *Deleted

## 2017-07-19 ENCOUNTER — Encounter: Payer: Self-pay | Admitting: Family Medicine

## 2017-07-19 VITALS — BP 120/80 | HR 84 | Temp 98.6°F | Ht 64.8 in | Wt 263.0 lb

## 2017-07-19 DIAGNOSIS — J4521 Mild intermittent asthma with (acute) exacerbation: Secondary | ICD-10-CM

## 2017-07-19 MED ORDER — ALBUTEROL SULFATE (2.5 MG/3ML) 0.083% IN NEBU: 2.5000 mg | INHALATION_SOLUTION | Freq: Four times a day (QID) | RESPIRATORY_TRACT | 0 refills | Status: DC | PRN

## 2017-07-19 MED ORDER — IPRATROPIUM-ALBUTEROL 0.5-2.5 (3) MG/3ML IN SOLN
3.0000 mL | Freq: Four times a day (QID) | RESPIRATORY_TRACT | Status: DC
  Administered 2017-07-19: 3 mL via RESPIRATORY_TRACT

## 2017-07-19 NOTE — Progress Notes (Signed)
Chief Complaint  Patient presents with  . Shortness of Breath    Subjective: Patient is a 77 y.o. female here for sob.  Patient has a history of asthma, CHF, and atrial fibrillation.  She was seen at [facility] on 12/29 for shortness of breath and was diagnosed with bronchitis.  She is placed on a Pred pack and azithromycin.  She states that she was supposed to be called in nebulization medicine but never received it.  Her breathing has not improved.  She is not having any fevers, chest pain, palpitations, runny/stuffy nose, weight changes, or swelling in the legs.   ROS: Heart: Denies chest pain  Lungs: +SOB   Family History  Problem Relation Age of Onset  . Diabetes Unknown   . Melanoma Unknown   . Factor V Leiden deficiency Daughter    Past Medical History:  Diagnosis Date  . Arthritis    "hands, wrists" (11/07/2016)  . CHF (congestive heart failure) ([place])   . Chronic lower back pain   . COPD (chronic obstructive pulmonary disease) ([place])   . DVT (deep venous thrombosis) ([place])   . History of blood transfusion 1990   "related to OR"  . History of kidney stones   . Hyperlipidemia   . Hypertension   . On home oxygen therapy    "2L w/CPAP when I sleep" (11/07/2016)  . OSA on CPAP   . PAF (paroxysmal atrial fibrillation) ([place])   . Pneumonia    "several times" (11/07/2016)  . Type II diabetes mellitus ([facility])    Allergies  Allergen Reactions  . Neomycin-Bacitracin Zn-Polymyx Rash  . Sulfonamide Derivatives Other (See Comments)    Stomach cramps  . Ciprofloxacin Hives, Itching and Rash  . Spironolactone Rash    Current Outpatient Medications:  .  albuterol (PROVENTIL) (2.5 MG/3ML) 0.083% nebulizer solution, Take 3 mLs (2.5 mg total) by nebulization every 6 (six) hours as needed for wheezing or shortness of breath., Disp: 150 mL, Rfl: 0 .  apixaban (ELIQUIS) 5 MG TABS tablet, Take 1 tablet (5 mg total) by mouth 2 (two) times daily., Disp: 180 tablet, Rfl: 3 .   eplerenone (INSPRA) 25 MG tablet, TAKE 1 TABLET (25 MG TOTAL) BY MOUTH DAILY., Disp: 90 tablet, Rfl: 3 .  fenofibrate 160 MG tablet, TAKE 1 TABLET BY MOUTH EVERY DAY, Disp: 90 tablet, Rfl: 1 .  furosemide (LASIX) 20 MG tablet, TAKE 2 TABLETS IN THE MORNING AND 1 TABLET IN THE EVENING, Disp: 90 tablet, Rfl: 4 .  glimepiride (AMARYL) 2 MG tablet, Take 2 mg by mouth as directed. Take 2 tablets every morning and 1 tablet tablets every evening., Disp: , Rfl:  .  glucose blood (ONETOUCH VERIO) test strip, Use as directed once daily to check blood sugar  E11.9, Disp: 100 each, Rfl: 6 .  insulin glargine (LANTUS) 100 UNIT/ML injection, Inject 10 Units into the skin at bedtime., Disp: , Rfl:  .  metFORMIN (GLUCOPHAGE) 1000 MG tablet, Take 1 tablet (1,000 mg total) by mouth 2 (two) times daily with a meal., Disp: 60 tablet, Rfl: 5 .  metoprolol succinate (TOPROL-XL) 25 MG 24 hr tablet, TAKE 1 TABLET (25 MG TOTAL) BY MOUTH 2 (TWO) TIMES DAILY., Disp: 60 tablet, Rfl: 6 .  metoprolol succinate (TOPROL-XL) 25 MG 24 hr tablet, TAKE 1 TABLET (25 MG TOTAL) BY MOUTH 2 (TWO) TIMES DAILY., Disp: 60 tablet, Rfl: 6 .  polyethylene glycol (MIRALAX) packet, Take 17 g by mouth daily as needed for mild  constipation., Disp: 14 each, Rfl: 0 .  potassium chloride (KLOR-CON 10) 10 MEQ tablet, TAKE 1 TABLET (10 MEQ TOTAL) BY MOUTH 2 (TWO) TIMES DAILY., Disp: 180 tablet, Rfl: 1 .  predniSONE (STERAPRED UNI-PAK 21 TAB) 10 MG (21) TBPK tablet, Take 6, 5, 4, 3, 2, 1., Disp: 21 tablet, Rfl: 0 .  sacubitril-valsartan (ENTRESTO) 24-26 MG, Take 1 tablet by mouth 2 (two) times daily., Disp: 180 tablet, Rfl: 3  Current Facility-Administered Medications:  .  ipratropium-albuterol (DUONEB) 0.5-2.5 (3) MG/3ML nebulizer solution 3 mL, 3 mL, Nebulization, Q6H, [place], Crosby Oyster, DO, 3 mL at 07/19/17 1404  Objective: BP 120/80 (BP Location: Left Arm, Patient Position: Sitting, Cuff Size: Large)   Pulse 84   Temp 98.6 F (37 C) (Oral)    Ht 5' 4.8" (1.646 m)   Wt 263 lb (119.3 kg)   SpO2 93%   BMI 44.04 kg/m  General: Awake, appears stated age HEENT: MMM, EOMi, ears neg b/l, nares patent, no d/c Heart: RRR, no bruits, 1+ pitting lower extremity edema bilaterally tapering at the proximal third of the tibia Lungs: + Diffuse expiratory wheezes. No accessory muscle use Psych: Age appropriate judgment and insight, normal affect and mood  Assessment and Plan: Mild intermittent asthma with exacerbation - Plan: ipratropium-albuterol (DUONEB) 0.5-2.5 (3) MG/3ML nebulizer solution 3 mL  Orders as above. She had a breathing treatment while in the office.  She felt improved after administration.  Her lungs were clear to auscultation bilaterally.  Given the improvement, we will call in solution for her home nebulizer.  If she fails to improve, I will plan to see her on Monday and we will discuss getting CXR and other workup pursuit. If she worsens before then, she is instructed to go to the emergency department. Cont steroid, finished with Zpak.  The patient voiced understanding and agreement to the plan.  [place], DO 07/19/17  2:17 PM

## 2017-07-19 NOTE — Telephone Encounter (Signed)
Patient saw Heidi Stark today

## 2017-07-19 NOTE — Telephone Encounter (Signed)
Routed incorrectly- sorry.

## 2017-07-19 NOTE — Progress Notes (Signed)
Pre visit review using our clinic review tool, if applicable. No additional management support is needed unless otherwise documented below in the visit note. 

## 2017-07-19 NOTE — Patient Instructions (Addendum)
If you are getting worse, go to the ER.   Cancel appointment if you are feeling better.  You can use the breathing treatment as much as you need. If you need it more than this, go to the ER.   Let us know if you need anything.

## 2017-07-19 NOTE — Telephone Encounter (Signed)
Patient was recently seen and treated for bronchitis. She reports she is no better and she is having trouble breathing. She states she has a history of asthma and was given a breathing treatment at her visit. She does not have any inhalers or home medications for her machine. She still has congestion and is blowing out yellow sputum. Patient states she almost went to the ED last night because she was so SOB.  Reason for Disposition . [1] MILD difficulty breathing (e.g., minimal/no SOB at rest, SOB with walking, pulse <100) AND [2] NEW-onset or WORSE than normal  Answer Assessment - Initial Assessment Questions 1. RESPIRATORY STATUS: "Describe your breathing?" (e.g., wheezing, shortness of breath, unable to speak, severe coughing)      SOB, wheezing 2. ONSET: "When did this breathing problem begin?"      Patient was treated for bronchitis-but not given anything for her astnma 3. PATTERN "Does the difficult breathing come and go, or has it been constant since it started?"      Breathing difficulty since the brochitis 4. SEVERITY: "How bad is your breathing?" (e.g., mild, moderate, severe)    - MILD: No SOB at rest, mild SOB with walking, speaks normally in sentences, can lay down, no retractions, pulse < 100.    - MODERATE: SOB at rest, SOB with minimal exertion and prefers to sit, cannot lie down flat, speaks in phrases, mild retractions, audible wheezing, pulse 100-120.    - SEVERE: Very SOB at rest, speaks in single words, struggling to breathe, sitting hunched forward, retractions, pulse > 120      Moderate- trouble sleeping last night- SOB today 5. RECURRENT SYMPTOM: "Have you had difficulty breathing before?" If so, ask: "When was the last time?" and "What happened that time?"      Asthma patient- she does have home nebulizer- but she does not have medication for it 6. CARDIAC HISTORY: "Do you have any history of heart disease?" (e.g., heart attack, angina, bypass surgery, angioplasty)   afib- surgery April 7. LUNG HISTORY: "Do you have any history of lung disease?"  (e.g., pulmonary embolus, asthma, emphysema)     CHF, asthma 8. CAUSE: "What do you think is causing the breathing problem?"      Asthma 9. OTHER SYMPTOMS: "Do you have any other symptoms? (e.g., dizziness, runny nose, cough, chest pain, fever)     Still has congestion in chest and sinus 10. PREGNANCY: "Is there any chance you are pregnant?" "When was your last menstrual period?"       n/a 11. TRAVEL: "Have you traveled out of the country in the last month?" (e.g., travel history, exposures)       no  Protocols used: BREATHING DIFFICULTY-A-AH

## 2017-07-23 ENCOUNTER — Ambulatory Visit: Payer: Medicare Other | Admitting: Family Medicine

## 2017-07-23 DIAGNOSIS — Z0289 Encounter for other administrative examinations: Secondary | ICD-10-CM

## 2017-08-31 ENCOUNTER — Other Ambulatory Visit: Payer: Self-pay | Admitting: Family Medicine

## 2017-10-01 ENCOUNTER — Ambulatory Visit: Payer: Medicare Other | Admitting: Family Medicine

## 2017-10-01 ENCOUNTER — Encounter: Payer: Self-pay | Admitting: Family Medicine

## 2017-10-01 VITALS — BP 120/72 | HR 95 | Temp 98.1°F | Ht 64.0 in | Wt 273.4 lb

## 2017-10-01 DIAGNOSIS — S81802A Unspecified open wound, left lower leg, initial encounter: Secondary | ICD-10-CM | POA: Insufficient documentation

## 2017-10-01 MED ORDER — SILVER SULFADIAZINE 1 % EX CREA: 1.0000 "application " | TOPICAL_CREAM | Freq: Every day | CUTANEOUS | 0 refills | Status: DC

## 2017-10-01 NOTE — Patient Instructions (Addendum)
Things to look out for: increasing pain not relieved by ibuprofen/acetaminophen, fevers, spreading redness, drainage of pus, or foul odor.  Ice/cold pack over area for 10-15 min twice daily.  Keep the area clean and dry.  Sleep Hygiene Tips:  Do not watch TV or look at screens within 1 hour of going to bed. If you do, make sure there is a blue light filter (nighttime mode) involved.  Try to go to bed around the same time every night. Wake up at the same time within 1 hour of regular time. Ex: If you wake up at 7 AM for work, do not sleep past 8 AM on days that you don't work.  Do not drink alcohol before bedtime.  Do not consume caffeine-containing beverages after noon or within 9 hours of intended bedtime.  Get regular exercise/physical activity in your life, but not within 2 hours of planned bedtime.  Do not take naps.   Do not eat within 2 hours of planned bedtime.  Melatonin, 3-5 mg 30-60 minutes before planned bedtime may be helpful.   The bed should be for sleep or sex only. If after 20-30 minutes you are unable to fall asleep, get up and do something relaxing. Do this until you feel ready to go to sleep again.   Sleep is important to [name] all. Getting good sleep is imperative to adequate functioning during the day. Work with our counselors who are trained to help people obtain quality sleep. Call [phone] to schedule an appointment or if you are curious about insurance coverage/cost.

## 2017-10-01 NOTE — Progress Notes (Signed)
Pre visit review using our clinic review tool, if applicable. No additional management support is needed unless otherwise documented below in the visit note. 

## 2017-10-01 NOTE — Progress Notes (Signed)
Chief Complaint  Patient presents with  . Leg Pain    right leg sores after getting pedicure    Heidi Stark is a 77 y.o. female here for a skin complaint.  Duration: 12 days Location: R leg Pruritic? No Painful? No  Redness is chronic from circulatory issues.  Drainage? Yes- clear/yellow New soaps/lotions/topicals/detergents? No Sick contacts? No Other associated symptoms: None Therapies tried thus far: None; cannot use TAO due to allergy  ROS:  Const: No fevers Skin: As noted in HPI  Past Medical History:  Diagnosis Date  . Arthritis    "hands, wrists" (11/07/2016)  . CHF (congestive heart failure) ([place])   . Chronic lower back pain   . COPD (chronic obstructive pulmonary disease) ([place])   . DVT (deep venous thrombosis) ([place])   . History of blood transfusion 1990   "related to OR"  . History of kidney stones   . Hyperlipidemia   . Hypertension   . On home oxygen therapy    "2L w/CPAP when I sleep" (11/07/2016)  . OSA on CPAP   . PAF (paroxysmal atrial fibrillation) ([place])   . Pneumonia    "several times" (11/07/2016)  . Type II diabetes mellitus ([facility])    Allergies  Allergen Reactions  . Neomycin-Bacitracin Zn-Polymyx Rash  . Sulfonamide Derivatives Other (See Comments)    Stomach cramps  . Ciprofloxacin Hives, Itching and Rash  . Spironolactone Rash     BP 120/72 (BP Location: Right Arm, Patient Position: Sitting, Cuff Size: Large)   Pulse 95   Temp 98.1 F (36.7 C) (Oral)   Ht 5\' 4"  (1.626 m)   Wt 273 lb 6 oz (124 kg)   SpO2 95%   BMI 46.92 kg/m  Gen: awake, alert, appearing stated age Lungs: No accessory muscle use Skin: see below. +drainage, +erythema, no TTP, fluctuance, excoriation, warmth Psych: Age appropriate judgment and insight  Media Information   RLE, anterior  Media Information   RLE, posterior  Wound of left lower extremity, initial encounter - Plan: silver sulfADIAZINE (SILVADENE) 1 % cream  Orders as above. Keep clean and  dry. Daily application. Reviewed AE from sulfa, sounds like PO issue.  F/u in 9-10 days. If no better will refer to wound clinic. The patient voiced understanding and agreement to the plan.  [place], DO 10/01/17 4:41 PM

## 2017-10-08 ENCOUNTER — Telehealth: Payer: Self-pay | Admitting: Family Medicine

## 2017-10-08 NOTE — Telephone Encounter (Signed)
Discussed with Dr. Katha Cabal said ok to give her more samples of what we have/our antibiotic is an ointment not a cream and neosporin may have an ingredient our antibiotic ointment  does not.

## 2017-10-08 NOTE — Telephone Encounter (Signed)
Copied from [place] [id number]. Topic: General - Other >> Oct 08, 2017 12:20 PM Darl Householder, RMA wrote: Reason for CRM: Patient is requesting a call back concerning medication prescribed silver sulfADIAZINE (SILVADENE) 1 % cream by Dr. Nani Ravens, pt is unable to use cream due to the reaction, please return pt call

## 2017-10-08 NOTE — Telephone Encounter (Signed)
Spoke to the patient---first 2 days went well On day 3 once applied on leg--it burnt her leg, felt like her nerves were reacting to it. Advise on alternative The only thing she is currently using is an antibiotic cream which feels fine.  She did say it looks much better.

## 2017-10-08 NOTE — Telephone Encounter (Signed)
OK to stop Silvadene and keep with abx ointment if it is not reacting with her. Ty.

## 2017-10-08 NOTE — Telephone Encounter (Signed)
I gave her 2 samples in the office and is what she is using,  But she said she is allergic to neosporin??? I told her that what we have has the same ingredients as neosporin and told her to not use again.  She has had no reaction to it as of yet, but concerned she may. So what should she use?

## 2017-11-13 ENCOUNTER — Telehealth (HOSPITAL_COMMUNITY): Payer: Self-pay | Admitting: *Deleted

## 2017-11-13 MED ORDER — FUROSEMIDE 20 MG PO TABS: 40.0000 mg | ORAL_TABLET | Freq: Two times a day (BID) | ORAL | 4 refills | Status: DC

## 2017-11-13 NOTE — Telephone Encounter (Signed)
Increase Lasix to 40 mg bid and get BMET in 1 week.  Appt in APP clinic soon.

## 2017-11-13 NOTE — Telephone Encounter (Signed)
Pt aware and agreeable, f/u appt w/APP sch for 11/22/17 will get bmet then

## 2017-11-13 NOTE — Telephone Encounter (Signed)
Pt left VM on triage line earlier this AM stating she was having SOB off/on.  Returned call to patient. She states she has been having SOB for the past 3 days and it even woke her up last night.  She denies CP or discomfort.  She states when she is walking/moving around she has to stop to catch her breath.  She also reports some slight swelling in her feet/legs which has been going on for about at week now.  She states over the past year her wt has increased about 30 lbs but over the past few weeks it has been relatively stable.  She is taking Lasix 40 mg in AM and 20 mg in PM, will send to Dr [name] for review and call her back, she is agreeable.

## 2017-11-21 ENCOUNTER — Encounter (HOSPITAL_COMMUNITY): Payer: Self-pay

## 2017-11-21 ENCOUNTER — Ambulatory Visit (HOSPITAL_COMMUNITY)
Admission: RE | Admit: 2017-11-21 | Discharge: 2017-11-21 | Disposition: A | Discharge status: Home / Self Care | Payer: Medicare Other | Source: Ambulatory Visit | Attending: Cardiology | Admitting: Cardiology

## 2017-11-21 VITALS — BP 134/98 | HR 117 | Wt 277.0 lb

## 2017-11-21 DIAGNOSIS — I350 Nonrheumatic aortic (valve) stenosis: Secondary | ICD-10-CM | POA: Insufficient documentation

## 2017-11-21 DIAGNOSIS — Z8249 Family history of ischemic heart disease and other diseases of the circulatory system: Secondary | ICD-10-CM | POA: Insufficient documentation

## 2017-11-21 DIAGNOSIS — Z79899 Other long term (current) drug therapy: Secondary | ICD-10-CM | POA: Insufficient documentation

## 2017-11-21 DIAGNOSIS — I5022 Chronic systolic (congestive) heart failure: Secondary | ICD-10-CM | POA: Insufficient documentation

## 2017-11-21 DIAGNOSIS — I4892 Unspecified atrial flutter: Secondary | ICD-10-CM

## 2017-11-21 DIAGNOSIS — Z832 Family history of diseases of the blood and blood-forming organs and certain disorders involving the immune mechanism: Secondary | ICD-10-CM | POA: Diagnosis not present

## 2017-11-21 DIAGNOSIS — Z7901 Long term (current) use of anticoagulants: Secondary | ICD-10-CM | POA: Insufficient documentation

## 2017-11-21 DIAGNOSIS — E785 Hyperlipidemia, unspecified: Secondary | ICD-10-CM | POA: Diagnosis not present

## 2017-11-21 DIAGNOSIS — J449 Chronic obstructive pulmonary disease, unspecified: Secondary | ICD-10-CM | POA: Diagnosis not present

## 2017-11-21 DIAGNOSIS — Z794 Long term (current) use of insulin: Secondary | ICD-10-CM | POA: Insufficient documentation

## 2017-11-21 DIAGNOSIS — I480 Paroxysmal atrial fibrillation: Secondary | ICD-10-CM | POA: Diagnosis not present

## 2017-11-21 DIAGNOSIS — I2510 Atherosclerotic heart disease of native coronary artery without angina pectoris: Secondary | ICD-10-CM | POA: Diagnosis not present

## 2017-11-21 DIAGNOSIS — E1151 Type 2 diabetes mellitus with diabetic peripheral angiopathy without gangrene: Secondary | ICD-10-CM | POA: Diagnosis not present

## 2017-11-21 DIAGNOSIS — Z87891 Personal history of nicotine dependence: Secondary | ICD-10-CM | POA: Diagnosis not present

## 2017-11-21 DIAGNOSIS — I110 Hypertensive heart disease with heart failure: Secondary | ICD-10-CM | POA: Diagnosis not present

## 2017-11-21 LAB — BASIC METABOLIC PANEL
ANION GAP: 8 (ref 5–15)
BUN: 13 mg/dL (ref 6–20)
CHLORIDE: 100 mmol/L — AB (ref 101–111)
CO2: 32 mmol/L (ref 22–32)
Calcium: 8.7 mg/dL — ABNORMAL LOW (ref 8.9–10.3)
Creatinine, Ser: 1.15 mg/dL — ABNORMAL HIGH (ref 0.44–1.00)
GFR calc Af Amer: 53 mL/min — ABNORMAL LOW (ref >60)
GFR, EST NON AFRICAN AMERICAN: 45 mL/min — AB (ref >60)
GLUCOSE: 220 mg/dL — AB (ref 65–99)
POTASSIUM: 4 mmol/L (ref 3.5–5.1)
Sodium: 140 mmol/L (ref 135–145)

## 2017-11-21 NOTE — Patient Instructions (Signed)
You have been scheduled for a cardioversion. Please see attached instruction sheet for additional details.  Continue Lasix 40 mg twice daily.  Routine lab work today. Will notify you of abnormal results, otherwise no news is good news!  Follow up 1 month with Dr. [name] and echocardiogram.  ____________________________________________________________ Heidi Stark Code: 5436  Take all medication as prescribed the day of your appointment. Bring all medications with you to your appointment.  Do the following things EVERYDAY: 1) Weigh yourself in the morning before breakfast. Write it down and keep it in a log. 2) Take your medicines as prescribed 3) Eat low salt foods-Limit salt (sodium) to 2000 mg per day.  4) Stay as active as you can everyday 5) Limit all fluids for the day to less than 2 liters

## 2017-11-21 NOTE — H&P (View-Only) (Signed)
Patient ID: Heidi Stark, female   DOB: [birth year], 77 y.o.   MRN: [id number] PCP: Dr. [name] Cardiology: Dr. Horald Chestnut Heidi Stark is a 77 y.o. female with a history of HTN, DM, hyperlipidemia presents for cardiology followup.  She was admitted in 4/15 with splenic and renal infarcts.  She was found to have paroxysmal atrial fibrillation.  In 4/15, she had fever and flank pain.  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  TEE showed prominent aortic plaque.  While she was in the hospital, she was noted to have a brief run of atrial fibrillation and was started on coumadin.    She has left leg pain with ambulation after about 100 feet, LE dopplers 5/15 showed occluded left external iliac artery.  This has been present ever since she had left iliac damage with a prior back surgery.    She was stable until 1/18, when she developed palpitations and exertional dyspnea.  She was admitted to [facility] with atrial fibrillation/RVR and acute systolic CHF.  EF was found to be 30-35%, down from 50-55% in 2015.  She was diuresed and underwent DCCV back to NSR.  She subsequently had atrial fibrillation ablation in 4/18.  She remains in NSR today.  Last echo in 4/18 with EF back up to 50%.   She presents today with request for appointment for SOB. Last seen in June 2018. She has had SOB with exertion x 2-3 weeks. She cant think of any triggers. She called on 4/30 with worsening SOB. Her lasix was increased to 40 mg BID. She has had slight improvement with increased lasix. Denies orthopnea/PND. Wears CPAP every night. Denies CP, palpitations, and dizziness. She is limiting her sodium intake. She does not limit fluid intake. Compliant with meds. Has not missed any Eliquis. Weights have been stable over the last 2 months: 275-278 lbs.   Labs (4/15): K 4.1, creatinine 0.7, HCT 46.2 Labs (7/15): K 3.9, creatinine 0.8, HCT 50 Labs (12/15): K 5, creatinine 0.7, HDL 33, LDL 57 Labs  (5/16): K 4, creatinine 0.84, TGs 256, HDL 29, LDL 42, HCT 41.2 Labs (1/18): K 3.4, creatinine 0.78 Labs (2/18): K 4.4, creatinine 0.8, free T3/T4 normal, mildly elevated TSH, LFTs normal, hgb 15.6 Labs (4/18): K 4.2, creatinine 0.86, TSH elevated, free T3 and free T4 normal  PMH: 1.Type II diabetes 2. HTN 3. Hyperlipidemia 4. COPD 5. CAD: Coronary CTA (4/18) with nonobstructive coronary disease, calcium score 335 Agatston units (82nd percentile).  6. Paroxysmal atrial fibrillation: First noted in 4/15.  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   - Event monitor (4/15) with no atrial fibrillation.  - Admission with atrial fibrillation/RVR in 1/18.  - Atrial fibrillation ablation in 4/18.  7. Chronic systolic CHF: ?tachycardia-mediated as first noted in setting of atrial fibrillation with RVR.  - TEE (4/15) with EF 50-55%, mild LVH, grade III-IV plaque in descending thoracic aorta, RV normal, no PFO, peak RV-RA gradient 42 mmHg.   - Echo (1/18) with EF 30-35%, diffuse hypokinesis, moderate LVH, mildly dilated RV, PASP 55 mmHg.  - Echo (4/18) with EF 50%, anterolateral/inferolateral mild hypokinesis, mild aortic stenosis, IVC normal.  7. H/o diskectomy. 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 Lower extremity arterial dopplers (5/15) with occluded left external iliac artery.  - ABIs (5/18): 1.16 (normal) on right, 0.56 on left.  9. Aortic stenosis: Mild on 4/18 echo.  SH: Married, 2 kids, quit smoking in 2015.  FH: Daughter with factor V Leiden deficiency.  Brother with CABG.  Sister with aneurysm.   ROS: All systems reviewed and negative except as per HPI.   Current Outpatient Medications  Medication Sig Dispense Refill  . albuterol (PROVENTIL) (2.5 MG/3ML) 0.083% nebulizer solution Take 3 mLs (2.5 mg total) by nebulization every 6 (six) hours as needed for wheezing or shortness of breath. 150 mL 0  . apixaban (ELIQUIS) 5  MG TABS tablet Take 1 tablet (5 mg total) by mouth 2 (two) times daily. 180 tablet 3  . eplerenone (INSPRA) 25 MG tablet TAKE 1 TABLET (25 MG TOTAL) BY MOUTH DAILY. 90 tablet 3  . fenofibrate 160 MG tablet TAKE 1 TABLET BY MOUTH EVERY DAY 90 tablet 1  . furosemide (LASIX) 20 MG tablet Take 2 tablets (40 mg total) by mouth 2 (two) times daily. 90 tablet 4  . glimepiride (AMARYL) 2 MG tablet Take 2 mg by mouth as directed. Take 2 tablets every morning and 1 tablet tablets every evening.    Marland Kitchen glucose blood (ONETOUCH VERIO) test strip Use as directed once daily to check blood sugar  E11.9 100 each 6  . insulin glargine (LANTUS) 100 UNIT/ML injection Inject 10 Units into the skin at bedtime.    . metFORMIN (GLUCOPHAGE) 1000 MG tablet Take 1 tablet (1,000 mg total) by mouth 2 (two) times daily with a meal. 60 tablet 5  . metoprolol succinate (TOPROL-XL) 25 MG 24 hr tablet TAKE 1 TABLET (25 MG TOTAL) BY MOUTH 2 (TWO) TIMES DAILY. 60 tablet 6  . polyethylene glycol (MIRALAX) packet Take 17 g by mouth daily as needed for mild constipation. 14 each 0  . potassium chloride (KLOR-CON 10) 10 MEQ tablet TAKE 1 TABLET (10 MEQ TOTAL) BY MOUTH 2 (TWO) TIMES DAILY. 180 tablet 1  . predniSONE (STERAPRED UNI-PAK 21 TAB) 10 MG (21) TBPK tablet Take 6, 5, 4, 3, 2, 1. 21 tablet 0  . sacubitril-valsartan (ENTRESTO) 24-26 MG Take 1 tablet by mouth 2 (two) times daily. 180 tablet 3  . silver sulfADIAZINE (SILVADENE) 1 % cream Apply 1 application topically daily. 50 g 0   Current Facility-Administered Medications  Medication Dose Route Frequency Provider Last Rate Last Dose  . ipratropium-albuterol (DUONEB) 0.5-2.5 (3) MG/3ML nebulizer solution 3 mL  3 mL Nebulization Q6H Shelda Pal, DO   3 mL at 07/19/17 1404    Vitals:   11/21/17 1436  BP: (!) 134/98  Pulse: (!) 117  SpO2: 97%   Filed Weights   11/21/17 1436  Weight: 277 lb (125.6 kg)   General: Well appearing. No resp difficulty. HEENT:  Normal Neck: Supple. JVP 5-6. Carotids 2+ bilat; no bruits. No thyromegaly or nodule noted. Cor: PMI nondisplaced. IRR, 2/6 early SEM Lungs: CTAB, normal effort. Abdomen: Soft, non-tender, non-distended, no HSM. No bruits or masses. +BS  Extremities: No cyanosis, clubbing, or rash. R and LLE 1+ bilateral ankle edema. Neuro: Alert & orientedx3, cranial nerves grossly intact. moves all 4 extremities w/o difficulty. Affect pleasant  EKG: Atrial flutter with variable AV block, 95 bpm  Assessment/Plan:  1. Chronic systolic CHF: EF 64-40% on echo in 1/18, down from 50-55% in 4/15.  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   NYHA class III, Volume status mildly elevated, improving with increased lasix - Continue Toprol XL 25 mg bid.  - Continue Entresto 24/26 bid.   -  Continue Lasix 40 mg BID - Continue eplerenone 25 mg daily (rash with spiro) 2. Atrial fibrillation: Paroxysmal.  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 She was admitted in 1/18 with atrial fibrillation/RVR and CHF.  She was diuresed and had DCCV back to NSR.  She had atrial fibrillation ablation in 4/18. In atrial flutter today.  - DCCV next week with Dr [name]. She has not missed any eliquis doses - Off amiodarone since 12/2016 per Dr [name]. Keep off for now per Dr [name] - Continue Eliquis.    3. Claudication: Left leg claudication with absent left PT pulse.  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   - Follows with [facility] 4. CAD: Coronary CTA showed coronary calcium score in the 82nd percentile with nonobstructive coronary disease.  - not on ASA 81 as she is taking Eliqius.  - Continue Crestor 5. COPD: She has quit smoking completely.    Continue lasix 40 mg BID BMET today DCCV next week Follow up w [name] in 1 month  Discussed cardioversion with  patient. Discussed EKG results and to continue lasix as ordered. Instructed her to call with worsening symptoms.   Georgiana Shore 11/21/2017

## 2017-11-21 NOTE — Progress Notes (Signed)
Patient ID: Heidi Stark, female   DOB: Jan 20, 1941, 77 y.o.   MRN: [id number] PCP: Dr. [name] Cardiology: Dr. Horald Chestnut Heidi Stark is a 77 y.o. female with a history of HTN, DM, hyperlipidemia presents for cardiology followup.  She was admitted in 4/15 with splenic and renal infarcts.  She was found to have paroxysmal atrial fibrillation.  In 4/15, she had fever and flank pain.  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  TEE showed prominent aortic plaque.  While she was in the hospital, she was noted to have a brief run of atrial fibrillation and was started on coumadin.    She has left leg pain with ambulation after about 100 feet, LE dopplers 5/15 showed occluded left external iliac artery.  This has been present ever since she had left iliac damage with a prior back surgery.    She was stable until 1/18, when she developed palpitations and exertional dyspnea.  She was admitted to [facility] with atrial fibrillation/RVR and acute systolic CHF.  EF was found to be 30-35%, down from 50-55% in 2015.  She was diuresed and underwent DCCV back to NSR.  She subsequently had atrial fibrillation ablation in 4/18.  She remains in NSR today.  Last echo in 4/18 with EF back up to 50%.   She presents today with request for appointment for SOB. Last seen in June 2018. She has had SOB with exertion x 2-3 weeks. She cant think of any triggers. She called on 4/30 with worsening SOB. Her lasix was increased to 40 mg BID. She has had slight improvement with increased lasix. Denies orthopnea/PND. Wears CPAP every night. Denies CP, palpitations, and dizziness. She is limiting her sodium intake. She does not limit fluid intake. Compliant with meds. Has not missed any Eliquis. Weights have been stable over the last 2 months: 275-278 lbs.   Labs (4/15): K 4.1, creatinine 0.7, HCT 46.2 Labs (7/15): K 3.9, creatinine 0.8, HCT 50 Labs (12/15): K 5, creatinine 0.7, HDL 33, LDL 57 Labs  (5/16): K 4, creatinine 0.84, TGs 256, HDL 29, LDL 42, HCT 41.2 Labs (1/18): K 3.4, creatinine 0.78 Labs (2/18): K 4.4, creatinine 0.8, free T3/T4 normal, mildly elevated TSH, LFTs normal, hgb 15.6 Labs (4/18): K 4.2, creatinine 0.86, TSH elevated, free T3 and free T4 normal  PMH: 1.Type II diabetes 2. HTN 3. Hyperlipidemia 4. COPD 5. CAD: Coronary CTA (4/18) with nonobstructive coronary disease, calcium score 335 Agatston units (82nd percentile).  6. Paroxysmal atrial fibrillation: First noted in 4/15.  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   - Event monitor (4/15) with no atrial fibrillation.  - Admission with atrial fibrillation/RVR in 1/18.  - Atrial fibrillation ablation in 4/18.  7. Chronic systolic CHF: ?tachycardia-mediated as first noted in setting of atrial fibrillation with RVR.  - TEE (4/15) with EF 50-55%, mild LVH, grade III-IV plaque in descending thoracic aorta, RV normal, no PFO, peak RV-RA gradient 42 mmHg.   - Echo (1/18) with EF 30-35%, diffuse hypokinesis, moderate LVH, mildly dilated RV, PASP 55 mmHg.  - Echo (4/18) with EF 50%, anterolateral/inferolateral mild hypokinesis, mild aortic stenosis, IVC normal.  7. H/o diskectomy. 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 Lower extremity arterial dopplers (5/15) with occluded left external iliac artery.  - ABIs (5/18): 1.16 (normal) on right, 0.56 on left.  9. Aortic stenosis: Mild on 4/18 echo.  SH: Married, 2 kids, quit smoking in 2015.  FH: Daughter with factor V Leiden deficiency.  Brother with CABG.  Sister with aneurysm.   ROS: All systems reviewed and negative except as per HPI.   Current Outpatient Medications  Medication Sig Dispense Refill  . albuterol (PROVENTIL) (2.5 MG/3ML) 0.083% nebulizer solution Take 3 mLs (2.5 mg total) by nebulization every 6 (six) hours as needed for wheezing or shortness of breath. 150 mL 0  . apixaban (ELIQUIS) 5  MG TABS tablet Take 1 tablet (5 mg total) by mouth 2 (two) times daily. 180 tablet 3  . eplerenone (INSPRA) 25 MG tablet TAKE 1 TABLET (25 MG TOTAL) BY MOUTH DAILY. 90 tablet 3  . fenofibrate 160 MG tablet TAKE 1 TABLET BY MOUTH EVERY DAY 90 tablet 1  . furosemide (LASIX) 20 MG tablet Take 2 tablets (40 mg total) by mouth 2 (two) times daily. 90 tablet 4  . glimepiride (AMARYL) 2 MG tablet Take 2 mg by mouth as directed. Take 2 tablets every morning and 1 tablet tablets every evening.    Marland Kitchen glucose blood (ONETOUCH VERIO) test strip Use as directed once daily to check blood sugar  E11.9 100 each 6  . insulin glargine (LANTUS) 100 UNIT/ML injection Inject 10 Units into the skin at bedtime.    . metFORMIN (GLUCOPHAGE) 1000 MG tablet Take 1 tablet (1,000 mg total) by mouth 2 (two) times daily with a meal. 60 tablet 5  . metoprolol succinate (TOPROL-XL) 25 MG 24 hr tablet TAKE 1 TABLET (25 MG TOTAL) BY MOUTH 2 (TWO) TIMES DAILY. 60 tablet 6  . polyethylene glycol (MIRALAX) packet Take 17 g by mouth daily as needed for mild constipation. 14 each 0  . potassium chloride (KLOR-CON 10) 10 MEQ tablet TAKE 1 TABLET (10 MEQ TOTAL) BY MOUTH 2 (TWO) TIMES DAILY. 180 tablet 1  . predniSONE (STERAPRED UNI-PAK 21 TAB) 10 MG (21) TBPK tablet Take 6, 5, 4, 3, 2, 1. 21 tablet 0  . sacubitril-valsartan (ENTRESTO) 24-26 MG Take 1 tablet by mouth 2 (two) times daily. 180 tablet 3  . silver sulfADIAZINE (SILVADENE) 1 % cream Apply 1 application topically daily. 50 g 0   Current Facility-Administered Medications  Medication Dose Route Frequency Provider Last Rate Last Dose  . ipratropium-albuterol (DUONEB) 0.5-2.5 (3) MG/3ML nebulizer solution 3 mL  3 mL Nebulization Q6H Shelda Pal, DO   3 mL at 07/19/17 1404    Vitals:   11/21/17 1436  BP: (!) 134/98  Pulse: (!) 117  SpO2: 97%   Filed Weights   11/21/17 1436  Weight: 277 lb (125.6 kg)   General: Well appearing. No resp difficulty. HEENT:  Normal Neck: Supple. JVP 5-6. Carotids 2+ bilat; no bruits. No thyromegaly or nodule noted. Cor: PMI nondisplaced. IRR, 2/6 early SEM Lungs: CTAB, normal effort. Abdomen: Soft, non-tender, non-distended, no HSM. No bruits or masses. +BS  Extremities: No cyanosis, clubbing, or rash. R and LLE 1+ bilateral ankle edema. Neuro: Alert & orientedx3, cranial nerves grossly intact. moves all 4 extremities w/o difficulty. Affect pleasant  EKG: Atrial flutter with variable AV block, 95 bpm  Assessment/Plan:  1. Chronic systolic CHF: EF 93-79% on echo in 1/18, down from 50-55% in 4/15.  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   NYHA class III, Volume status mildly elevated, improving with increased lasix - Continue Toprol XL 25 mg bid.  - Continue Entresto 24/26 bid.   -  Continue Lasix 40 mg BID - Continue eplerenone 25 mg daily (rash with spiro) 2. Atrial fibrillation: Paroxysmal.  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 She was admitted in 1/18 with atrial fibrillation/RVR and CHF.  She was diuresed and had DCCV back to NSR.  She had atrial fibrillation ablation in 4/18. In atrial flutter today.  - DCCV next week with Dr [name]. She has not missed any eliquis doses - Off amiodarone since 12/2016 per Dr [name]. Keep off for now per Dr [name] - Continue Eliquis.    3. Claudication: Left leg claudication with absent left PT pulse.  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   - Follows with [facility] 4. CAD: Coronary CTA showed coronary calcium score in the 82nd percentile with nonobstructive coronary disease.  - not on ASA 81 as she is taking Eliqius.  - Continue Crestor 5. COPD: She has quit smoking completely.    Continue lasix 40 mg BID BMET today DCCV next week Follow up w [name] in 1 month  Discussed cardioversion with  patient. Discussed EKG results and to continue lasix as ordered. Instructed her to call with worsening symptoms.   Georgiana Shore 11/21/2017

## 2017-11-22 ENCOUNTER — Encounter (HOSPITAL_COMMUNITY): Payer: Medicare Other

## 2017-11-23 ENCOUNTER — Encounter (HOSPITAL_COMMUNITY): Payer: Self-pay | Admitting: Cardiology

## 2017-11-26 ENCOUNTER — Encounter (HOSPITAL_COMMUNITY): Payer: Self-pay

## 2017-11-26 DIAGNOSIS — I5022 Chronic systolic (congestive) heart failure: Secondary | ICD-10-CM

## 2017-11-26 DIAGNOSIS — I4892 Unspecified atrial flutter: Secondary | ICD-10-CM

## 2017-12-01 ENCOUNTER — Other Ambulatory Visit: Payer: Self-pay | Admitting: Family Medicine

## 2017-12-03 ENCOUNTER — Ambulatory Visit (HOSPITAL_COMMUNITY): Payer: Medicare Other | Admitting: Certified Registered Nurse Anesthetist

## 2017-12-03 ENCOUNTER — Ambulatory Visit (HOSPITAL_COMMUNITY)
Admission: RE | Admit: 2017-12-03 | Discharge: 2017-12-03 | Disposition: A | Discharge status: Home / Self Care | Payer: Medicare Other | Source: Ambulatory Visit | Attending: Cardiology | Admitting: Cardiology

## 2017-12-03 ENCOUNTER — Other Ambulatory Visit: Payer: Self-pay

## 2017-12-03 ENCOUNTER — Encounter (HOSPITAL_COMMUNITY): Payer: Self-pay

## 2017-12-03 ENCOUNTER — Encounter (HOSPITAL_COMMUNITY)
Admission: RE | Disposition: A | Discharge status: Home / Self Care | Payer: Self-pay | Source: Ambulatory Visit | Attending: Cardiology

## 2017-12-03 DIAGNOSIS — J449 Chronic obstructive pulmonary disease, unspecified: Secondary | ICD-10-CM | POA: Diagnosis not present

## 2017-12-03 DIAGNOSIS — Z8249 Family history of ischemic heart disease and other diseases of the circulatory system: Secondary | ICD-10-CM | POA: Diagnosis not present

## 2017-12-03 DIAGNOSIS — Z79899 Other long term (current) drug therapy: Secondary | ICD-10-CM | POA: Insufficient documentation

## 2017-12-03 DIAGNOSIS — I5022 Chronic systolic (congestive) heart failure: Secondary | ICD-10-CM | POA: Insufficient documentation

## 2017-12-03 DIAGNOSIS — I2510 Atherosclerotic heart disease of native coronary artery without angina pectoris: Secondary | ICD-10-CM | POA: Diagnosis not present

## 2017-12-03 DIAGNOSIS — Z794 Long term (current) use of insulin: Secondary | ICD-10-CM | POA: Diagnosis not present

## 2017-12-03 DIAGNOSIS — Z9889 Other specified postprocedural states: Secondary | ICD-10-CM | POA: Insufficient documentation

## 2017-12-03 DIAGNOSIS — I110 Hypertensive heart disease with heart failure: Secondary | ICD-10-CM | POA: Insufficient documentation

## 2017-12-03 DIAGNOSIS — E785 Hyperlipidemia, unspecified: Secondary | ICD-10-CM | POA: Diagnosis not present

## 2017-12-03 DIAGNOSIS — E119 Type 2 diabetes mellitus without complications: Secondary | ICD-10-CM | POA: Diagnosis not present

## 2017-12-03 DIAGNOSIS — I480 Paroxysmal atrial fibrillation: Secondary | ICD-10-CM | POA: Diagnosis not present

## 2017-12-03 DIAGNOSIS — I4892 Unspecified atrial flutter: Secondary | ICD-10-CM

## 2017-12-03 DIAGNOSIS — Z7901 Long term (current) use of anticoagulants: Secondary | ICD-10-CM | POA: Insufficient documentation

## 2017-12-03 DIAGNOSIS — Z87891 Personal history of nicotine dependence: Secondary | ICD-10-CM | POA: Diagnosis not present

## 2017-12-03 HISTORY — PX: CARDIOVERSION: SHX1299

## 2017-12-03 LAB — COMPREHENSIVE METABOLIC PANEL
ALBUMIN: 3.4 g/dL — AB (ref 3.5–5.0)
ALK PHOS: 36 U/L — AB (ref 38–126)
ALT: 15 U/L (ref 14–54)
ANION GAP: 12 (ref 5–15)
AST: 54 U/L — ABNORMAL HIGH (ref 15–41)
BILIRUBIN TOTAL: 2.2 mg/dL — AB (ref 0.3–1.2)
BUN: 15 mg/dL (ref 6–20)
CALCIUM: 8.6 mg/dL — AB (ref 8.9–10.3)
CO2: 27 mmol/L (ref 22–32)
Chloride: 98 mmol/L — ABNORMAL LOW (ref 101–111)
Creatinine, Ser: 0.78 mg/dL (ref 0.44–1.00)
GFR calc non Af Amer: >60 mL/min (ref >60)
GLUCOSE: 166 mg/dL — AB (ref 65–99)
POTASSIUM: 5.9 mmol/L — AB (ref 3.5–5.1)
SODIUM: 137 mmol/L (ref 135–145)
TOTAL PROTEIN: 6.7 g/dL (ref 6.5–8.1)

## 2017-12-03 LAB — POCT I-STAT, CHEM 8
BUN: 18 mg/dL (ref 6–20)
Calcium, Ion: 1.11 mmol/L — ABNORMAL LOW (ref 1.15–1.40)
Chloride: 97 mmol/L — ABNORMAL LOW (ref 101–111)
Creatinine, Ser: 0.7 mg/dL (ref 0.44–1.00)
GLUCOSE: 146 mg/dL — AB (ref 65–99)
HCT: 51 % — ABNORMAL HIGH (ref 36.0–46.0)
Hemoglobin: 17.3 g/dL — ABNORMAL HIGH (ref 12.0–15.0)
POTASSIUM: 3.9 mmol/L (ref 3.5–5.1)
Sodium: 141 mmol/L (ref 135–145)
TCO2: 31 mmol/L (ref 22–32)

## 2017-12-03 LAB — MAGNESIUM: Magnesium: 2 mg/dL (ref 1.7–2.4)

## 2017-12-03 SURGERY — CARDIOVERSION
Anesthesia: General

## 2017-12-03 MED ORDER — LIDOCAINE 2% (20 MG/ML) 5 ML SYRINGE
INTRAMUSCULAR | Status: DC | PRN
  Administered 2017-12-03: 100 mg via INTRAVENOUS

## 2017-12-03 MED ORDER — PROPOFOL 10 MG/ML IV BOLUS
INTRAVENOUS | Status: DC | PRN
Start: 2017-12-03 — End: 2017-12-03
  Administered 2017-12-03: 70 mg via INTRAVENOUS

## 2017-12-03 MED ORDER — SODIUM CHLORIDE 0.9 % IV SOLN: 250.0000 mL | INTRAVENOUS | Status: DC

## 2017-12-03 MED ORDER — HYDROCORTISONE 1 % EX CREA: 1.0000 "application " | TOPICAL_CREAM | Freq: Three times a day (TID) | CUTANEOUS | Status: DC | PRN

## 2017-12-03 MED ORDER — SODIUM CHLORIDE 0.9% FLUSH: 3.0000 mL | INTRAVENOUS | Status: DC | PRN

## 2017-12-03 MED ORDER — SODIUM CHLORIDE 0.9% FLUSH: 3.0000 mL | Freq: Two times a day (BID) | INTRAVENOUS | Status: DC

## 2017-12-03 MED ORDER — SODIUM CHLORIDE 0.9 % IV SOLN
INTRAVENOUS | Status: DC
  Administered 2017-12-03: 10:00:00 via INTRAVENOUS

## 2017-12-03 NOTE — Discharge Instructions (Signed)
Electrical Cardioversion, Care After °This sheet gives you information about how to care for yourself after your procedure. Your health care provider may also give you more specific instructions. If you have problems or questions, contact your health care provider. °What can I expect after the procedure? °After the procedure, it is common to have: °· Some redness on the skin where the shocks were given. ° °Follow these instructions at home: °· Do not drive for 24 hours if you were given a medicine to help you relax (sedative). °· Take over-the-counter and prescription medicines only as told by your health care provider. °· Ask your health care provider how to check your pulse. Check it often. °· Rest for 48 hours after the procedure or as told by your health care provider. °· Avoid or limit your caffeine use as told by your health care provider. °Contact a health care provider if: °· You feel like your heart is beating too quickly or your pulse is not regular. °· You have a serious muscle cramp that does not go away. °Get help right away if: °· You have discomfort in your chest. °· You are dizzy or you feel faint. °· You have trouble breathing or you are short of breath. °· Your speech is slurred. °· You have trouble moving an arm or leg on one side of your body. °· Your fingers or toes turn cold or blue. °This information is not intended to replace advice given to you by your health care provider. Make sure you discuss any questions you have with your health care provider. °Document Released: 04/23/2013 Document Revised: 02/04/2016 Document Reviewed: 01/07/2016 °Elsevier Interactive Patient Education © 2018 [employer] Inc[employer] ° °

## 2017-12-03 NOTE — Transfer of Care (Signed)
Immediate Anesthesia Transfer of Care Note  Patient: Heidi Stark  Procedure(s) Performed: CARDIOVERSION (N/A )  Patient Location: Endoscopy Unit  Anesthesia Type:MAC  Level of Consciousness: drowsy  Airway & Oxygen Therapy: Patient Spontanous Breathing and Patient connected to nasal cannula oxygen  Post-op Assessment: Report given to RN and Post -op Vital signs reviewed and stable  Post vital signs: Reviewed and stable  Last Vitals:  Vitals Value Taken Time  BP    Temp    Pulse    Resp    SpO2      Last Pain:  Vitals:   12/03/17 0955  TempSrc: Oral  PainSc: 5          Complications: No apparent anesthesia complications

## 2017-12-03 NOTE — Anesthesia Preprocedure Evaluation (Signed)
Anesthesia Evaluation  Patient identified by MRN, date of birth, ID band Patient awake    Reviewed: Allergy & Precautions, NPO status , Patient's Chart, lab work & pertinent test results  Airway Mallampati: I  TM Distance: >3 FB Neck ROM: Full    Dental   Pulmonary sleep apnea , COPD, former smoker,    Pulmonary exam normal        Cardiovascular hypertension, Pt. on medications Normal cardiovascular exam     Neuro/Psych    GI/Hepatic   Endo/Other  diabetes, Type 2, Oral Hypoglycemic Agents  Renal/GU      Musculoskeletal   Abdominal   Peds  Hematology   Anesthesia Other Findings   Reproductive/Obstetrics                             Anesthesia Physical Anesthesia Plan  ASA: III  Anesthesia Plan: General   Post-op Pain Management:    Induction: Intravenous  PONV Risk Score and Plan: Treatment may vary due to age or medical condition  Airway Management Planned: Mask  Additional Equipment:   Intra-op Plan:   Post-operative Plan:   Informed Consent: I have reviewed the patients History and Physical, chart, labs and discussed the procedure including the risks, benefits and alternatives for the proposed anesthesia with the patient or authorized representative who has indicated his/her understanding and acceptance.     Plan Discussed with: CRNA and Surgeon  Anesthesia Plan Comments:         Anesthesia Quick Evaluation

## 2017-12-03 NOTE — Anesthesia Postprocedure Evaluation (Signed)
Anesthesia Post Note  Patient: Heidi Stark  Procedure(s) Performed: CARDIOVERSION (N/A )     Patient location during evaluation: PACU Anesthesia Type: General Level of consciousness: awake and alert Pain management: pain level controlled Vital Signs Assessment: post-procedure vital signs reviewed and stable Respiratory status: spontaneous breathing, nonlabored ventilation, respiratory function stable and patient connected to nasal cannula oxygen Cardiovascular status: blood pressure returned to baseline and stable Postop Assessment: no apparent nausea or vomiting Anesthetic complications: no    Last Vitals:  Vitals:   12/03/17 1135 12/03/17 1150  BP: 97/68 113/77  Pulse:    Resp: 20 20  Temp: 36.9 C   SpO2: 93% 93%    Last Pain:  Vitals:   12/03/17 1150  TempSrc:   PainSc: 0-No pain                 Ilaria Much DAVID

## 2017-12-03 NOTE — Procedures (Signed)
Electrical Cardioversion Procedure Note Heidi Stark [id number] 1940/11/05  Procedure: Electrical Cardioversion Indications:  Atrial Flutter  Procedure Details Consent: Risks of procedure as well as the alternatives and risks of each were explained to the (patient/caregiver).  Consent for procedure obtained. Time Out: Verified patient identification, verified procedure, site/side was marked, verified correct patient position, special equipment/implants available, medications/allergies/relevent history reviewed, required imaging and test results available.  Performed  Patient placed on cardiac monitor, pulse oximetry, supplemental oxygen as necessary.  Sedation given: Propofol per anesthesiology. Pacer pads placed anterior and posterior chest.  Cardioverted 1 time(s).  Cardioverted at 150J.  Evaluation Findings: Post procedure EKG shows: NSR Complications: None Patient did tolerate procedure well.   Heidi Stark 12/03/2017, 11:28 AM

## 2017-12-03 NOTE — Interval H&P Note (Signed)
History and Physical Interval Note:  12/03/2017 11:15 AM  Heidi Stark  has presented today for surgery, with the diagnosis of AFLUTTER  The various methods of treatment have been discussed with the patient and family. After consideration of risks, benefits and other options for treatment, the patient has consented to  Procedure(s): CARDIOVERSION (N/A) as a surgical intervention .  The patient's history has been reviewed, patient examined, no change in status, stable for surgery.  I have reviewed the patient's chart and labs.  Questions were answered to the patient's satisfaction.     Dalton [employer]

## 2017-12-04 ENCOUNTER — Other Ambulatory Visit: Payer: Self-pay | Admitting: Family Medicine

## 2017-12-08 ENCOUNTER — Other Ambulatory Visit: Payer: Self-pay | Admitting: Family Medicine

## 2017-12-14 ENCOUNTER — Other Ambulatory Visit (HOSPITAL_COMMUNITY): Payer: Self-pay

## 2017-12-14 DIAGNOSIS — I480 Paroxysmal atrial fibrillation: Secondary | ICD-10-CM

## 2017-12-17 ENCOUNTER — Other Ambulatory Visit (HOSPITAL_COMMUNITY): Payer: Self-pay | Admitting: Cardiology

## 2017-12-21 ENCOUNTER — Ambulatory Visit (HOSPITAL_COMMUNITY)
Admission: RE | Admit: 2017-12-21 | Discharge: 2017-12-21 | Disposition: A | Discharge status: Home / Self Care | Payer: Medicare Other | Source: Ambulatory Visit | Attending: Family Medicine | Admitting: Family Medicine

## 2017-12-21 ENCOUNTER — Ambulatory Visit (HOSPITAL_BASED_OUTPATIENT_CLINIC_OR_DEPARTMENT_OTHER)
Admission: RE | Admit: 2017-12-21 | Discharge: 2017-12-21 | Disposition: A | Discharge status: Home / Self Care | Payer: Medicare Other | Source: Ambulatory Visit | Attending: Cardiology | Admitting: Cardiology

## 2017-12-21 ENCOUNTER — Telehealth (HOSPITAL_COMMUNITY): Payer: Self-pay | Admitting: *Deleted

## 2017-12-21 VITALS — BP 154/92 | HR 72 | Wt 274.8 lb

## 2017-12-21 DIAGNOSIS — I480 Paroxysmal atrial fibrillation: Secondary | ICD-10-CM

## 2017-12-21 DIAGNOSIS — R21 Rash and other nonspecific skin eruption: Secondary | ICD-10-CM | POA: Insufficient documentation

## 2017-12-21 DIAGNOSIS — J449 Chronic obstructive pulmonary disease, unspecified: Secondary | ICD-10-CM | POA: Insufficient documentation

## 2017-12-21 DIAGNOSIS — Z09 Encounter for follow-up examination after completed treatment for conditions other than malignant neoplasm: Secondary | ICD-10-CM | POA: Insufficient documentation

## 2017-12-21 DIAGNOSIS — I10 Essential (primary) hypertension: Secondary | ICD-10-CM | POA: Insufficient documentation

## 2017-12-21 DIAGNOSIS — I2510 Atherosclerotic heart disease of native coronary artery without angina pectoris: Secondary | ICD-10-CM | POA: Insufficient documentation

## 2017-12-21 DIAGNOSIS — Z7982 Long term (current) use of aspirin: Secondary | ICD-10-CM | POA: Diagnosis not present

## 2017-12-21 DIAGNOSIS — Z7984 Long term (current) use of oral hypoglycemic drugs: Secondary | ICD-10-CM | POA: Insufficient documentation

## 2017-12-21 DIAGNOSIS — E119 Type 2 diabetes mellitus without complications: Secondary | ICD-10-CM | POA: Diagnosis not present

## 2017-12-21 DIAGNOSIS — Z79899 Other long term (current) drug therapy: Secondary | ICD-10-CM | POA: Insufficient documentation

## 2017-12-21 DIAGNOSIS — I5022 Chronic systolic (congestive) heart failure: Secondary | ICD-10-CM | POA: Diagnosis not present

## 2017-12-21 DIAGNOSIS — I739 Peripheral vascular disease, unspecified: Secondary | ICD-10-CM

## 2017-12-21 DIAGNOSIS — Z87891 Personal history of nicotine dependence: Secondary | ICD-10-CM | POA: Insufficient documentation

## 2017-12-21 DIAGNOSIS — E785 Hyperlipidemia, unspecified: Secondary | ICD-10-CM | POA: Insufficient documentation

## 2017-12-21 DIAGNOSIS — Z7901 Long term (current) use of anticoagulants: Secondary | ICD-10-CM | POA: Diagnosis not present

## 2017-12-21 DIAGNOSIS — Z794 Long term (current) use of insulin: Secondary | ICD-10-CM | POA: Insufficient documentation

## 2017-12-21 LAB — BASIC METABOLIC PANEL
Anion gap: 12 (ref 5–15)
BUN: 12 mg/dL (ref 6–20)
CO2: 29 mmol/L (ref 22–32)
CREATININE: 0.83 mg/dL (ref 0.44–1.00)
Calcium: 8.8 mg/dL — ABNORMAL LOW (ref 8.9–10.3)
Chloride: 100 mmol/L — ABNORMAL LOW (ref 101–111)
GFR calc Af Amer: >60 mL/min (ref >60)
Glucose, Bld: 179 mg/dL — ABNORMAL HIGH (ref 65–99)
Potassium: 3.7 mmol/L (ref 3.5–5.1)
SODIUM: 141 mmol/L (ref 135–145)

## 2017-12-21 MED ORDER — SACUBITRIL-VALSARTAN 49-51 MG PO TABS: 1.0000 | ORAL_TABLET | Freq: Two times a day (BID) | ORAL | 11 refills | Status: DC

## 2017-12-21 MED ORDER — SACUBITRIL-VALSARTAN 49-51 MG PO TABS: 1.0000 | ORAL_TABLET | Freq: Two times a day (BID) | ORAL | 3 refills | Status: DC

## 2017-12-21 NOTE — Progress Notes (Signed)
  Echocardiogram 2D Echocardiogram has been performed.  Heidi Stark 12/21/2017, 2:24 PM

## 2017-12-21 NOTE — Telephone Encounter (Signed)
Patient having a hard time affording Entresto copay.  Had patient sign [employer] Patient assistance enrollment application today.  Patient will bring copies of monthly income to her lab appt scheduled December 31, 2017.  Will wait until I receive financial statements to fax completed application.

## 2017-12-21 NOTE — Patient Instructions (Signed)
Increase Entresto 49/51 (1 tab) twice a day  Labs drawn today (if we do not call you, then your lab work was stable)   Your physician recommends that you return for lab work in: 10 days   Your physician recommends that you schedule a follow-up appointment in: 3 months with Dr. [name]

## 2017-12-23 NOTE — Progress Notes (Signed)
Patient ID: Heidi Stark, female   DOB: 02/19/1941, 77 y.o.   MRN: [id number] PCP: Dr. [name] Cardiology: Dr. [name]  77 y.o. with history of HTN, DM, hyperlipidemia presents for cardiology followup.  She was admitted in 4/15 with splenic and renal infarcts.  She was found to have paroxysmal atrial fibrillation.  In 4/15, she had fever and flank pain.  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  TEE showed prominent aortic plaque.  While she was in the hospital, she was noted to have a brief run of atrial fibrillation and was started on coumadin.    She has left leg pain with ambulation after about 100 feet, LE dopplers 5/15 showed occluded left external iliac artery.  This has been present ever since she had left iliac damage with a prior back surgery.    She was stable until 1/18, when she developed palpitations and exertional dyspnea.  She was admitted to [facility] with atrial fibrillation/RVR and acute systolic CHF.  EF was found to be 30-35%, down from 50-55% in 2015.  She was diuresed and underwent DCCV back to NSR.  She subsequently had atrial fibrillation ablation in 4/18.  She went back into atrial fibrillation in 5/19 and had DCCV back today NSR.   Echo was done today and reviewed, EF 55%, mild LVH, moderate diastolic dysfunction, normal RV size and systolic function.   She returns for followup of atrial fibrillation and diastolic CHF.  She remains in NSR today.  No chest pain.  No lightheadedness or palpitations.  She is short of breath walking longer distances but has more problems from leg pain. She wears compression stockings.  BP is elevated today.  Weight down 3 lbs. She is still smoking. She is using CPAP.   Labs (4/15): K 4.1, creatinine 0.7, HCT 46.2 Labs (7/15): K 3.9, creatinine 0.8, HCT 50 Labs (12/15): K 5, creatinine 0.7, HDL 33, LDL 57 Labs (5/16): K 4, creatinine 0.84, TGs 256, HDL 29, LDL 42, HCT 41.2 Labs (1/18): K 3.4, creatinine  0.78 Labs (2/18): K 4.4, creatinine 0.8, free T3/T4 normal, mildly elevated TSH, LFTs normal, hgb 15.6 Labs (4/18): K 4.2, creatinine 0.86, TSH elevated, free T3 and free T4 normal Labs (5/19): K 3.9, creatinine 0.78  ECG (personally reviewed): NSR, PVCs  PMH: 1.Type II diabetes 2. HTN 3. Hyperlipidemia 4. COPD 5. CAD: Coronary CTA (4/18) with nonobstructive coronary disease, calcium score 335 Agatston units (82nd percentile).  6. Paroxysmal atrial fibrillation: First noted in 4/15.  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   - Event monitor (4/15) with no atrial fibrillation.  - Admission with atrial fibrillation/RVR in 1/18.  - Atrial fibrillation ablation in 4/18.  - DCCV in 5/19.  7. Chronic systolic CHF: ?tachycardia-mediated as first noted in setting of atrial fibrillation with RVR.  - TEE (4/15) with EF 50-55%, mild LVH, grade III-IV plaque in descending thoracic aorta, RV normal, no PFO, peak RV-RA gradient 42 mmHg.   - Echo (1/18) with EF 30-35%, diffuse hypokinesis, moderate LVH, mildly dilated RV, PASP 55 mmHg.  - Echo (4/18) with EF 50%, anterolateral/inferolateral mild hypokinesis, mild aortic stenosis, IVC normal.  - Echo (6/19) with EF 55%, mild LVH, moderate diastolic dysfunction, normal RV size and systolic function.  7. H/o diskectomy. 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 Lower extremity arterial dopplers (5/15) with occluded left external iliac artery.  - ABIs (5/18):  1.16 (normal) on right, 0.56 on left.  9. Aortic stenosis: Mild on 4/18 echo.  10. OSA: Uses CPAP  SH: Married, 2 kids, quit smoking in 2015.  FH: Daughter with factor V Leiden deficiency.  Brother with CABG.  Sister with aneurysm.   ROS: All systems reviewed and negative except as per HPI.   Current Outpatient Medications  Medication Sig Dispense Refill  . acetaminophen (TYLENOL) 650 MG CR tablet Take 650 mg by mouth every 8 (eight)  hours as needed for pain.    Marland Kitchen apixaban (ELIQUIS) 5 MG TABS tablet Take 1 tablet (5 mg total) by mouth 2 (two) times daily. 180 tablet 3  . eplerenone (INSPRA) 25 MG tablet TAKE 1 TABLET (25 MG TOTAL) BY MOUTH DAILY. 90 tablet 3  . fenofibrate 160 MG tablet TAKE 1 TABLET BY MOUTH EVERY DAY 30 tablet 0  . furosemide (LASIX) 20 MG tablet Take 2 tablets (40 mg total) by mouth 2 (two) times daily. 90 tablet 4  . glimepiride (AMARYL) 2 MG tablet Take 1-2 mg by mouth See admin instructions. Take 4 mg by mouth every morning and 2 mg by mouth tablets every evening.    . insulin glargine (LANTUS) 100 UNIT/ML injection Inject 10 Units into the skin at bedtime.    . metFORMIN (GLUCOPHAGE) 1000 MG tablet Take 1 tablet (1,000 mg total) by mouth 2 (two) times daily with a meal. 60 tablet 5  . metoprolol succinate (TOPROL-XL) 25 MG 24 hr tablet TAKE 1 TABLET (25 MG TOTAL) BY MOUTH 2 (TWO) TIMES DAILY. 60 tablet 6  . potassium chloride (KLOR-CON M10) 10 MEQ tablet TAKE 1 TABLET BY MOUTH TWICE A DAY 180 tablet 0  . rosuvastatin (CRESTOR) 10 MG tablet Take 10 mg by mouth daily.    . sacubitril-valsartan (ENTRESTO) 49-51 MG Take 1 tablet by mouth 2 (two) times daily. 60 tablet 11   No current facility-administered medications for this encounter.     BP (!) 154/92   Pulse 72   Wt 274 lb 12.8 oz (124.6 kg)   SpO2 96%   BMI 47.17 kg/m  General: NAD Neck: No JVD, no thyromegaly or thyroid nodule.  Lungs: Clear to auscultation bilaterally with normal respiratory effort. CV: Nondisplaced PMI.  Heart regular S1/S2, no S3/S4, 2/6 early SEM with clear S2.  Chronic venous stasis changes with 1+ edema to knees bilaterally.  No carotid bruit.  No PT pulse on left.  Abdomen: Soft, nontender, no hepatosplenomegaly, no distention.  Skin: Intact without lesions or rashes.  Neurologic: Alert and oriented x 3.  Psych: Normal affect. Extremities: No clubbing or cyanosis.  HEENT: Normal.   Assessment/Plan: 1. Atrial  fibrillation: Paroxysmal.  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 She was admitted in 1/18 with atrial fibrillation/RVR and CHF.  She was diuresed and had DCCV back to NSR.  She had atrial fibrillation ablation in 4/18.  In 5/19, she went into atrial fibrillation but is now back in NSR.  - Continue Eliquis.    - 1 symptomatic episode of breakthrough atrial fibrillation since ablation.  Would avoid restarting amiodarone.  2. Claudication: Left leg claudication with absent left PT pulse.  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   3. Chronic systolic CHF: EF 16-10% on echo in 1/18, down from 50-55% in 4/15.  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  NYHA class II symptoms, she is not volume overloaded on exam.  - Continue Toprol XL 25 mg bid.  - I will increase Entresto to 49/51 bid for better BP control.   BMET today and again in 10 days.  - Continue Lasix 40 mg bid.  - ?rash from spironolactone, now on eplerenone 25 mg daily.    4. CAD: Coronary CTA showed coronary calcium score in the 82nd percentile with nonobstructive coronary disease.  - not on ASA 81 as she is taking Eliqius.  - Continue Crestor.   5. COPD: Needs to quit smoking.   Followup in 3 months.   [name] 12/23/2017

## 2017-12-24 ENCOUNTER — Encounter: Payer: Medicare Other | Admitting: Cardiology

## 2017-12-24 ENCOUNTER — Ambulatory Visit: Payer: Medicare Other | Admitting: Cardiology

## 2017-12-24 ENCOUNTER — Encounter: Payer: Self-pay | Admitting: Cardiology

## 2017-12-24 VITALS — BP 128/74 | HR 83 | Ht 66.0 in | Wt 274.0 lb

## 2017-12-24 DIAGNOSIS — I5022 Chronic systolic (congestive) heart failure: Secondary | ICD-10-CM | POA: Diagnosis not present

## 2017-12-24 DIAGNOSIS — I2510 Atherosclerotic heart disease of native coronary artery without angina pectoris: Secondary | ICD-10-CM | POA: Diagnosis not present

## 2017-12-24 DIAGNOSIS — I4819 Other persistent atrial fibrillation: Secondary | ICD-10-CM

## 2017-12-24 DIAGNOSIS — I481 Persistent atrial fibrillation: Secondary | ICD-10-CM | POA: Diagnosis not present

## 2017-12-24 NOTE — Progress Notes (Signed)
error 

## 2017-12-24 NOTE — Progress Notes (Signed)
Electrophysiology Office Note   Date:  12/24/2017   ID:  Heidi, Stark [birth year], MRN [id number]  PCP:  [name], [name], DO  Cardiologist:  [name] Primary Electrophysiologist:  Suhailah Kwan [name], MD    No chief complaint on file.    History of Present Illness: Heidi Stark is a 77 y.o. female who is being seen today for the evaluation of atrial fibrillatoin at the request of [name], [name], *. Presenting today for electrophysiology evaluation.  She has a history of diastolic heart failure, COPD, DVT, hypertension, hyperlipidemia, OSA on CPAP, paroxysmal atrial fibrillation, type 2 diabetes.  She wears oxygen to sleep at night.  She was admitted on 4/15 with splenic and renal infarcts.  She was noted to have paroxysmal atrial fibrillation.  She was started on Coumadin at that time.  She was noted to have left leg pain with ambulation of about 100 feet.  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  On 1/18 she developed L potation's and exertional dyspnea.  She was found to be in atrial fibrillation with rapid rates and acute systolic heart failure.  Her ejection fraction was 30 to 35% down from normal in 2015.  She was diuresed and underwent cardioversion.  She had an atrial fibrillation ablation 10/2016.  She was seen in clinic 11/2017 and was found to be in atrial fibrillation and is status post cardioversion.  Today, she denies symptoms of palpitations, chest pain, shortness of breath, orthopnea, PND, lower extremity edema, claudication, dizziness, presyncope, syncope, bleeding, or neurologic sequela. The patient is tolerating medications without difficulties.    Past Medical History:  Diagnosis Date  . Arthritis    "hands, wrists" (11/07/2016)  . CHF (congestive heart failure) ([place])   . Chronic lower back pain   . COPD (chronic obstructive pulmonary disease) ([place])   . DVT (deep venous thrombosis) ([place])   . History of blood transfusion  1990   "related to OR"  . History of kidney stones   . Hyperlipidemia   . Hypertension   . On home oxygen therapy    "2L w/CPAP when I sleep" (11/07/2016)  . OSA on CPAP   . PAF (paroxysmal atrial fibrillation) ([place])   . Pneumonia    "several times" (11/07/2016)  . Type II diabetes mellitus ([place])    Past Surgical History:  Procedure Laterality Date  . ATRIAL FIBRILLATION ABLATION N/A 11/07/2016   Procedure: Atrial Fibrillation Ablation;  Surgeon: Brieann Osinski [name], MD;  Location: [facility];  Service: Cardiovascular;  Laterality: N/A;  . BACK SURGERY    . CARDIAC CATHETERIZATION    . CARDIOVERSION N/A 08/11/2016   Procedure: CARDIOVERSION;  Surgeon: Larey Dresser, MD;  Location: [facility] ENDOSCOPY;  Service: Cardiovascular;  Laterality: N/A;  . CARDIOVERSION N/A 12/03/2017   Procedure: CARDIOVERSION;  Surgeon: Larey Dresser, MD;  Location: [facility] ENDOSCOPY;  Service: Cardiovascular;  Laterality: N/A;  . CATARACT EXTRACTION W/ INTRAOCULAR LENS  IMPLANT, BILATERAL Bilateral   . COLON SURGERY  1990   vein graft and colon repair after nicked artery with back surgert  . COLONOSCOPY    . CYSTECTOMY     between bladder and kidneys  . GANGLION CYST EXCISION Left   . KNEE CARTILAGE SURGERY Right 1960s  . [place] SURGERY  1990  . REPAIR ILIAC ARTERY  1990   vein graft and colon repair after nicked artery with back surgert  . TEE WITHOUT CARDIOVERSION N/A 10/31/2013  Procedure: TRANSESOPHAGEAL ECHOCARDIOGRAM (TEE);  Surgeon: Larey Dresser, MD;  Location: [facility];  Service: Cardiovascular;  Laterality: N/A;  . TUBAL LIGATION       Current Outpatient Medications  Medication Sig Dispense Refill  . acetaminophen (TYLENOL) 650 MG CR tablet Take 650 mg by mouth every 8 (eight) hours as needed for pain.    Marland Kitchen apixaban (ELIQUIS) 5 MG TABS tablet Take 1 tablet (5 mg total) by mouth 2 (two) times daily. 180 tablet 3  . eplerenone (INSPRA) 25 MG tablet TAKE 1 TABLET (25 MG TOTAL) BY  MOUTH DAILY. 90 tablet 3  . fenofibrate 160 MG tablet TAKE 1 TABLET BY MOUTH EVERY DAY 30 tablet 0  . furosemide (LASIX) 20 MG tablet Take 2 tablets (40 mg total) by mouth 2 (two) times daily. 90 tablet 4  . glimepiride (AMARYL) 2 MG tablet Take 1-2 mg by mouth See admin instructions. Take 4 mg by mouth every morning and 2 mg by mouth tablets every evening.    . insulin glargine (LANTUS) 100 UNIT/ML injection Inject 10 Units into the skin at bedtime.    . metFORMIN (GLUCOPHAGE) 1000 MG tablet Take 1 tablet (1,000 mg total) by mouth 2 (two) times daily with a meal. 60 tablet 5  . metoprolol succinate (TOPROL-XL) 25 MG 24 hr tablet TAKE 1 TABLET (25 MG TOTAL) BY MOUTH 2 (TWO) TIMES DAILY. 60 tablet 6  . potassium chloride (KLOR-CON M10) 10 MEQ tablet TAKE 1 TABLET BY MOUTH TWICE A DAY 180 tablet 0  . rosuvastatin (CRESTOR) 10 MG tablet Take 10 mg by mouth daily.    . sacubitril-valsartan (ENTRESTO) 49-51 MG Take 1 tablet by mouth 2 (two) times daily. 60 tablet 11   No current facility-administered medications for this visit.     Allergies:   Neomycin-bacitracin zn-polymyx; Sulfonamide derivatives; Ciprofloxacin; and Spironolactone   Social History:  The patient  reports that she quit smoking about 14 months ago. Her smoking use included cigarettes. She has a 45.00 pack-year smoking history. She has never used smokeless tobacco. She reports that she does not drink alcohol or use drugs.   Family History:  The patient's family history includes Diabetes in her unknown relative; Factor V Leiden deficiency in her daughter; Melanoma in her unknown relative.    ROS:  Please see the history of present illness.   Otherwise, review of systems is positive for leg pain, rash.   All other systems are reviewed and negative.    PHYSICAL EXAM: VS:  BP 128/74   Pulse 83   Ht 5\' 6"  (1.676 m)   Wt 274 lb (124.3 kg)   BMI 44.22 kg/m  , BMI Body mass index is 44.22 kg/m. GEN: Well nourished, well developed,  in no acute distress  HEENT: normal  Neck: no JVD, carotid bruits, or masses Cardiac: RRR; no murmurs, rubs, or gallops,no edema  Respiratory:  clear to auscultation bilaterally, normal work of breathing GI: soft, nontender, nondistended, + BS MS: no deformity or atrophy  Skin: warm and dry Neuro:  Strength and sensation are intact Psych: euthymic mood, full affect  EKG:  EKG is not ordered today. Personal review of the ekg ordered 12/21/17 shows sinus rhythm, rate 70, PVCs  Recent Labs: 01/01/2017: B Natriuretic Peptide 234.5; Platelets 305; TSH 4.964 12/03/2017: ALT 15; Hemoglobin 17.3; Magnesium 2.0 12/21/2017: BUN 12; Creatinine, Ser 0.83; Potassium 3.7; Sodium 141    Lipid Panel     Component Value Date/Time   CHOL 112 10/11/2015  1012   TRIG 187.0 (H) 10/11/2015 1012   HDL 28.90 (L) 10/11/2015 1012   CHOLHDL 4 10/11/2015 1012   VLDL 37.4 10/11/2015 1012   LDLCALC 46 10/11/2015 1012   LDLDIRECT 42.0 11/30/2014 1226     Wt Readings from Last 3 Encounters:  12/24/17 274 lb (124.3 kg)  12/24/17 274 lb (124.3 kg)  12/21/17 274 lb 12.8 oz (124.6 kg)      Other studies Reviewed: Additional studies/ records that were reviewed today include: TTE 12/21/17  Review of the above records today demonstrates:  - Left ventricle: The cavity size was normal. Wall thickness was   increased in a pattern of mild LVH. The estimated ejection   fraction was 55%. Although no diagnostic regional wall motion   abnormality was identified, this possibility cannot be completely   excluded on the basis of this study. Features are consistent with   a pseudonormal left ventricular filling pattern, with concomitant   abnormal relaxation and increased filling pressure (grade 2   diastolic dysfunction). - Aortic valve: There was no stenosis. - Mitral valve: Moderately fibrotic annulus. There was no   significant regurgitation. - Left atrium: The atrium was moderately dilated. - Right ventricle: The  cavity size was normal. Systolic function   was normal. - Right atrium: The atrium was mildly dilated. - Tricuspid valve: Peak RV-RA gradient (S): 16 mm Hg. - Pulmonary arteries: PA peak pressure: 19 mm Hg (S). - Inferior vena cava: The vessel was normal in size. The   respirophasic diameter changes were in the normal range (>= 50%),   consistent with normal central venous pressure.   ASSESSMENT AND PLAN:  1.  Paroxysmal atrial fibrillation: Currently on Eliquis.  Normal rhythm today.  She has had some atrial fibrillation and has required cardioversion since her most recent procedure.  She would likely require antiarrhythmics.  Would prefer to avoid amiodarone.  As she is minimally symptomatic with her heart failure and her ejection fraction has improved, multaq may be an option.  This patients CHA2DS2-VASc Score and unadjusted Ischemic Stroke Rate (% per year) is equal to 9.7 % stroke rate/year from a score of 6  Above score calculated as 1 point each if present [CHF, HTN, DM, Vascular=MI/PAD/Aortic Plaque, Age if 65-74, or Female] Above score calculated as 2 points each if present [Age > 75, or Stroke/TIA/TE]  2.  Chronic systolic heart failure: Action fraction has improved.  She has class I-II symptoms.  Currently on Toprol-XL, eplerenone, and Entresto.    3.  Coronary artery disease: CTA with a calcium score in the 82nd percentile with nonobstructive disease.  Continue Crestor  4.  COPD: Advised to quit smoking.  Current medicines are reviewed at length with the patient today.   The patient does not have concerns regarding her medicines.  The following changes were made today:  none  Labs/ tests ordered today include:  No orders of the defined types were placed in this encounter.    Disposition:   FU with Heidi Stark 1 year  Signed, Avalyn Molino [name], MD  12/24/2017 3:25 PM     [facility] [address] [place]  [address] [phone]  (office) [phone] (fax)

## 2017-12-24 NOTE — Patient Instructions (Signed)
Medication Instructions:  Your physician recommends that you continue on your current medications as directed. Please refer to the Current Medication list given to you today.  Labwork: None ordered  Testing/Procedures: None ordered  Follow-Up: Your physician wants you to follow-up in: 1 year with Dr. Camnitz.  You will receive a reminder letter in the mail two months in advance. If you don't receive a letter, please call our office to schedule the follow-up appointment.  * If you need a refill on your cardiac medications before your next appointment, please call your pharmacy.   *Please note that any paperwork needing to be filled out by the provider will need to be addressed at the front desk prior to seeing the provider. Please note that any FMLA, disability or other documents regarding health condition is subject to a $25.00 charge that must be received prior to completion of paperwork in the form of a money order or check.  Thank you for choosing [facility]!!   Daveon Arpino, RN [phone]        

## 2017-12-31 ENCOUNTER — Ambulatory Visit (HOSPITAL_COMMUNITY)
Admission: RE | Admit: 2017-12-31 | Discharge: 2017-12-31 | Disposition: A | Discharge status: Home / Self Care | Payer: Medicare Other | Source: Ambulatory Visit | Attending: Cardiology | Admitting: Cardiology

## 2017-12-31 DIAGNOSIS — I5022 Chronic systolic (congestive) heart failure: Secondary | ICD-10-CM | POA: Diagnosis present

## 2017-12-31 LAB — BASIC METABOLIC PANEL
Anion gap: 11 (ref 5–15)
BUN: 12 mg/dL (ref 6–20)
CHLORIDE: 99 mmol/L — AB (ref 101–111)
CO2: 30 mmol/L (ref 22–32)
CREATININE: 0.88 mg/dL (ref 0.44–1.00)
Calcium: 8.7 mg/dL — ABNORMAL LOW (ref 8.9–10.3)
GFR calc Af Amer: >60 mL/min (ref >60)
GFR calc non Af Amer: >60 mL/min (ref >60)
Glucose, Bld: 95 mg/dL (ref 65–99)
Potassium: 4.4 mmol/L (ref 3.5–5.1)
SODIUM: 140 mmol/L (ref 135–145)

## 2018-01-01 ENCOUNTER — Other Ambulatory Visit (HOSPITAL_COMMUNITY): Payer: Self-pay | Admitting: Cardiology

## 2018-01-03 ENCOUNTER — Other Ambulatory Visit (HOSPITAL_COMMUNITY): Payer: Self-pay

## 2018-01-04 ENCOUNTER — Encounter: Payer: Self-pay | Admitting: Family Medicine

## 2018-01-04 ENCOUNTER — Ambulatory Visit: Payer: Medicare Other | Admitting: Family Medicine

## 2018-01-04 ENCOUNTER — Other Ambulatory Visit (HOSPITAL_COMMUNITY): Payer: Self-pay | Admitting: *Deleted

## 2018-01-04 VITALS — BP 126/80 | HR 91 | Temp 98.1°F | Ht 66.0 in | Wt 274.0 lb

## 2018-01-04 DIAGNOSIS — Z711 Person with feared health complaint in whom no diagnosis is made: Secondary | ICD-10-CM | POA: Diagnosis not present

## 2018-01-04 MED ORDER — FUROSEMIDE 40 MG PO TABS: 40.0000 mg | ORAL_TABLET | Freq: Two times a day (BID) | ORAL | 3 refills | Status: DC

## 2018-01-04 NOTE — Progress Notes (Signed)
Pre visit review using our clinic review tool, if applicable. No additional management support is needed unless otherwise documented below in the visit note. 

## 2018-01-04 NOTE — Progress Notes (Signed)
Chief Complaint  Patient presents with  . Abscess    right leg and top of left foot    Heidi Stark is a 77 y.o. female here for a skin complaint.  Duration: 2 weeks Location: R thigh Pruritic? No Painful? No Drainage? No Hoping it's not an abscess, injects insulin.  ROS:  Const: No fevers Skin: As noted in HPI  Past Medical History:  Diagnosis Date  . Arthritis    "hands, wrists" (11/07/2016)  . CHF (congestive heart failure) ([place])   . Chronic lower back pain   . COPD (chronic obstructive pulmonary disease) ([place])   . DVT (deep venous thrombosis) ([place])   . History of blood transfusion 1990   "related to OR"  . History of kidney stones   . Hyperlipidemia   . Hypertension   . On home oxygen therapy    "2L w/CPAP when I sleep" (11/07/2016)  . OSA on CPAP   . PAF (paroxysmal atrial fibrillation) ([place])   . Pneumonia    "several times" (11/07/2016)  . Type II diabetes mellitus ([facility])    BP 126/80 (BP Location: Left Arm, Patient Position: Sitting, Cuff Size: Large)   Pulse 91   Temp 98.1 F (36.7 C) (Oral)   Ht 5\' 6"  (1.676 m)   Wt 274 lb (124.3 kg)   SpO2 94%   BMI 44.22 kg/m  Gen: awake, alert, appearing stated age Lungs: No accessory muscle use Skin: see below. No drainage, erythema, TTP, excessive warmth, fluctuance, excoriation Psych: Age appropriate judgment and insight   R lateral thigh   Worried well  No changes. This is healing tissue. No abscess. Pt relieved no I&D needed. F/u prn. The patient voiced understanding and agreement to the plan.  Elizabeth, DO 01/04/18 1:54 PM

## 2018-01-04 NOTE — Patient Instructions (Addendum)
Your leg looks good. There is nothing to do right now. This is scar tissue. I cannot guarantee that it will go away.   Let us know if you need anything.

## 2018-01-07 ENCOUNTER — Other Ambulatory Visit: Payer: Self-pay | Admitting: Family Medicine

## 2018-01-11 ENCOUNTER — Telehealth (HOSPITAL_COMMUNITY): Payer: Self-pay | Admitting: *Deleted

## 2018-01-11 NOTE — Telephone Encounter (Signed)
Called patient to obtain more information so that I can sign her up for [employer].  Had to leave VM asking for her to call back with SSN and Annual income.

## 2018-02-03 ENCOUNTER — Other Ambulatory Visit: Payer: Self-pay | Admitting: Family Medicine

## 2018-02-25 ENCOUNTER — Other Ambulatory Visit: Payer: Self-pay | Admitting: Family Medicine

## 2018-02-25 DIAGNOSIS — E1151 Type 2 diabetes mellitus with diabetic peripheral angiopathy without gangrene: Secondary | ICD-10-CM

## 2018-02-26 ENCOUNTER — Other Ambulatory Visit: Payer: Self-pay | Admitting: Family Medicine

## 2018-03-02 ENCOUNTER — Other Ambulatory Visit: Payer: Self-pay | Admitting: Internal Medicine

## 2018-03-27 ENCOUNTER — Encounter (HOSPITAL_COMMUNITY): Payer: Self-pay | Admitting: Cardiology

## 2018-03-27 ENCOUNTER — Other Ambulatory Visit (HOSPITAL_COMMUNITY): Payer: Self-pay | Admitting: *Deleted

## 2018-03-27 MED ORDER — APIXABAN 5 MG PO TABS
5.0000 mg | ORAL_TABLET | Freq: Two times a day (BID) | ORAL | 11 refills | Status: DC
Start: 2018-03-27 — End: 2018-04-05

## 2018-03-27 NOTE — Progress Notes (Signed)
Opened in error

## 2018-03-29 ENCOUNTER — Telehealth (HOSPITAL_COMMUNITY): Payer: Self-pay | Admitting: Pharmacist

## 2018-03-29 NOTE — Telephone Encounter (Signed)
[employer] patient assistance approved for Eliquis 5 mg BID through 07/16/18.   Ruta Hinds. Velva Harman, PharmD, BCPS, CPP Clinical Pharmacist Phone: [phone] 03/29/2018 10:13 AM

## 2018-03-31 ENCOUNTER — Other Ambulatory Visit: Payer: Self-pay | Admitting: Family Medicine

## 2018-04-01 ENCOUNTER — Ambulatory Visit (HOSPITAL_COMMUNITY)
Admission: RE | Admit: 2018-04-01 | Discharge: 2018-04-01 | Disposition: A | Discharge status: Home / Self Care | Payer: Medicare Other | Source: Ambulatory Visit | Attending: Cardiology | Admitting: Cardiology

## 2018-04-01 ENCOUNTER — Other Ambulatory Visit: Payer: Self-pay | Admitting: Family Medicine

## 2018-04-01 ENCOUNTER — Other Ambulatory Visit: Payer: Self-pay

## 2018-04-01 ENCOUNTER — Telehealth: Payer: Self-pay | Admitting: Pharmacist

## 2018-04-01 ENCOUNTER — Encounter (HOSPITAL_COMMUNITY): Payer: Self-pay | Admitting: Cardiology

## 2018-04-01 VITALS — BP 113/60 | HR 93 | Wt 273.1 lb

## 2018-04-01 DIAGNOSIS — Z79899 Other long term (current) drug therapy: Secondary | ICD-10-CM | POA: Diagnosis not present

## 2018-04-01 DIAGNOSIS — I5032 Chronic diastolic (congestive) heart failure: Secondary | ICD-10-CM | POA: Insufficient documentation

## 2018-04-01 DIAGNOSIS — I480 Paroxysmal atrial fibrillation: Secondary | ICD-10-CM | POA: Diagnosis not present

## 2018-04-01 DIAGNOSIS — E785 Hyperlipidemia, unspecified: Secondary | ICD-10-CM | POA: Diagnosis not present

## 2018-04-01 DIAGNOSIS — G4733 Obstructive sleep apnea (adult) (pediatric): Secondary | ICD-10-CM | POA: Diagnosis not present

## 2018-04-01 DIAGNOSIS — I739 Peripheral vascular disease, unspecified: Secondary | ICD-10-CM

## 2018-04-01 DIAGNOSIS — Z87891 Personal history of nicotine dependence: Secondary | ICD-10-CM | POA: Insufficient documentation

## 2018-04-01 DIAGNOSIS — I5022 Chronic systolic (congestive) heart failure: Secondary | ICD-10-CM

## 2018-04-01 DIAGNOSIS — J449 Chronic obstructive pulmonary disease, unspecified: Secondary | ICD-10-CM | POA: Diagnosis not present

## 2018-04-01 DIAGNOSIS — E119 Type 2 diabetes mellitus without complications: Secondary | ICD-10-CM | POA: Diagnosis not present

## 2018-04-01 DIAGNOSIS — I4891 Unspecified atrial fibrillation: Secondary | ICD-10-CM | POA: Diagnosis not present

## 2018-04-01 DIAGNOSIS — I481 Persistent atrial fibrillation: Secondary | ICD-10-CM

## 2018-04-01 DIAGNOSIS — I110 Hypertensive heart disease with heart failure: Secondary | ICD-10-CM | POA: Insufficient documentation

## 2018-04-01 DIAGNOSIS — I4819 Other persistent atrial fibrillation: Secondary | ICD-10-CM

## 2018-04-01 DIAGNOSIS — Z794 Long term (current) use of insulin: Secondary | ICD-10-CM | POA: Diagnosis not present

## 2018-04-01 DIAGNOSIS — I2510 Atherosclerotic heart disease of native coronary artery without angina pectoris: Secondary | ICD-10-CM | POA: Diagnosis not present

## 2018-04-01 DIAGNOSIS — Z7901 Long term (current) use of anticoagulants: Secondary | ICD-10-CM | POA: Diagnosis not present

## 2018-04-01 LAB — LIPID PANEL
CHOL/HDL RATIO: 4.8 ratio
Cholesterol: 139 mg/dL (ref 0–200)
HDL: 29 mg/dL — ABNORMAL LOW (ref >40)
LDL CALC: 34 mg/dL (ref 0–99)
Triglycerides: 380 mg/dL — ABNORMAL HIGH (ref <150)
VLDL: 76 mg/dL — AB (ref 0–40)

## 2018-04-01 LAB — BASIC METABOLIC PANEL
ANION GAP: 12 (ref 5–15)
BUN: 14 mg/dL (ref 8–23)
CO2: 29 mmol/L (ref 22–32)
Calcium: 9 mg/dL (ref 8.9–10.3)
Chloride: 99 mmol/L (ref 98–111)
Creatinine, Ser: 0.83 mg/dL (ref 0.44–1.00)
Glucose, Bld: 193 mg/dL — ABNORMAL HIGH (ref 70–99)
POTASSIUM: 3.8 mmol/L (ref 3.5–5.1)
SODIUM: 140 mmol/L (ref 135–145)

## 2018-04-01 LAB — CBC
HCT: 46.6 % — ABNORMAL HIGH (ref 36.0–46.0)
HEMOGLOBIN: 14.7 g/dL (ref 12.0–15.0)
MCH: 30.3 pg (ref 26.0–34.0)
MCHC: 31.5 g/dL (ref 30.0–36.0)
MCV: 96.1 fL (ref 78.0–100.0)
PLATELETS: 283 10*3/uL (ref 150–400)
RBC: 4.85 MIL/uL (ref 3.87–5.11)
RDW: 15.8 % — ABNORMAL HIGH (ref 11.5–15.5)
WBC: 8.8 10*3/uL (ref 4.0–10.5)

## 2018-04-01 MED ORDER — FUROSEMIDE 40 MG PO TABS: 60.0000 mg | ORAL_TABLET | Freq: Two times a day (BID) | ORAL | 3 refills | Status: DC

## 2018-04-01 NOTE — Patient Instructions (Signed)
INCREASE Lasix to 60mg  twice daily.  Routine lab work today. Will notify you of abnormal results  Repeat labs in 10 days.  Follow up in 1 month.   You have been referred to the Afib clinic. (they will contact you to schedule appointment)

## 2018-04-01 NOTE — Telephone Encounter (Signed)
Medication list reviewed in anticipation of upcoming Tikosyn initiation. Patient is not taking any contraindicated or QTc prolonging medications.   Patient is anticoagulated on Eliquis 5mg  BID on the appropriate dose. Please ensure that patient has not missed any anticoagulation doses in the 3 weeks prior to Tikosyn initiation.   K low today < 4 and may need dose increase of potassium supplementation.  Patient will need to be counseled to avoid use of Benadryl while on Tikosyn and in the 2-3 days prior to Tikosyn initiation.

## 2018-04-02 ENCOUNTER — Telehealth (HOSPITAL_COMMUNITY): Payer: Self-pay | Admitting: *Deleted

## 2018-04-02 NOTE — Telephone Encounter (Signed)
Patient approved for patient assistance with Eliquis through 07/16/18. Id #[id number]

## 2018-04-02 NOTE — Progress Notes (Signed)
Patient ID: Heidi Stark, female   DOB: 1941/07/04, 77 y.o.   MRN: [id number] PCP: Dr. [name] Cardiology: Dr. [name]  77 y.o. with history of HTN, DM, hyperlipidemia presents for cardiology followup.  She was admitted in 4/15 with splenic and renal infarcts.  She was found to have paroxysmal atrial fibrillation.  In 4/15, she had fever and flank pain.  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  TEE showed prominent aortic plaque.  While she was in the hospital, she was noted to have a brief run of atrial fibrillation and was started on coumadin.    She has left leg pain with ambulation after about 100 feet, LE dopplers 5/15 showed occluded left external iliac artery.  This has been present ever since she had left iliac damage with a prior back surgery.    She was stable until 1/18, when she developed palpitations and exertional dyspnea.  She was admitted to [facility] with atrial fibrillation/RVR and acute systolic CHF.  EF was found to be 30-35%, down from 50-55% in 2015.  She was diuresed and underwent DCCV back to NSR.  She subsequently had atrial fibrillation ablation in 4/18.  She went back into atrial fibrillation in 5/19 and had DCCV back today NSR.   Echo was done 6/19 and reviewed, EF 55%, mild LVH, moderate diastolic dysfunction, normal RV size and systolic function.   She returns for followup of atrial fibrillation and diastolic CHF.  She is in atypical atrial flutter today with rate 104.  She has not felt palpitations, but she has been more short of breath.  She is short of breath walking in the grocery store though is able to do ADLs without difficulty.  No chest pain.  No lightheadedness.  No BRBPR/melena.  Stable weight.  She quit smoking about 6 wks ago.    Labs (4/15): K 4.1, creatinine 0.7, HCT 46.2 Labs (7/15): K 3.9, creatinine 0.8, HCT 50 Labs (12/15): K 5, creatinine 0.7, HDL 33, LDL 57 Labs (5/16): K 4, creatinine 0.84, TGs 256, HDL 29, LDL  42, HCT 41.2 Labs (1/18): K 3.4, creatinine 0.78 Labs (2/18): K 4.4, creatinine 0.8, free T3/T4 normal, mildly elevated TSH, LFTs normal, hgb 15.6 Labs (4/18): K 4.2, creatinine 0.86, TSH elevated, free T3 and free T4 normal Labs (5/19): K 3.9, creatinine 0.78 Labs (6/19): K 4.4, creatinine 0.88  ECG (personally reviewed): atypical atrial flutter rate 104  PMH: 1.Type II diabetes 2. HTN 3. Hyperlipidemia 4. COPD: has quit smoking.  5. CAD: Coronary CTA (4/18) with nonobstructive coronary disease, calcium score 335 Agatston units (82nd percentile).  6. Paroxysmal atrial fibrillation: First noted in 4/15.  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   - Event monitor (4/15) with no atrial fibrillation.  - Admission with atrial fibrillation/RVR in 1/18.  - Atrial fibrillation ablation in 4/18.  - DCCV in 5/19.  7. Chronic systolic CHF: ?tachycardia-mediated as first noted in setting of atrial fibrillation with RVR.  - TEE (4/15) with EF 50-55%, mild LVH, grade III-IV plaque in descending thoracic aorta, RV normal, no PFO, peak RV-RA gradient 42 mmHg.   - Echo (1/18) with EF 30-35%, diffuse hypokinesis, moderate LVH, mildly dilated RV, PASP 55 mmHg.  - Echo (4/18) with EF 50%, anterolateral/inferolateral mild hypokinesis, mild aortic stenosis, IVC normal.  - Echo (6/19) with EF 55%, mild LVH, moderate diastolic dysfunction, normal RV size and systolic function.  7. H/o diskectomy.  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 Lower extremity arterial dopplers (5/15) with occluded left external iliac artery.  - ABIs (5/18): 1.16 (normal) on right, 0.56 on left.  9. Aortic stenosis: Mild on 4/18 echo.  10. OSA: Uses CPAP  SH: Married, 2 kids, quit smoking in 2019.   FH: Daughter with factor V Leiden deficiency.  Brother with CABG.  Sister with aneurysm.   ROS: All systems reviewed and negative except as per HPI.   Current Outpatient  Medications  Medication Sig Dispense Refill  . acetaminophen (TYLENOL) 650 MG CR tablet Take 650 mg by mouth every 8 (eight) hours as needed for pain.    Marland Kitchen apixaban (ELIQUIS) 5 MG TABS tablet Take 1 tablet (5 mg total) by mouth 2 (two) times daily. 60 tablet 11  . ELIQUIS 5 MG TABS tablet TAKE 1 TABLET BY MOUTH TWICE A DAY 180 tablet 1  . eplerenone (INSPRA) 25 MG tablet TAKE 1 TABLET BY MOUTH EVERY DAY 90 tablet 3  . fenofibrate 160 MG tablet TAKE 1 TABLET BY MOUTH DAILY *NEED OFFICE VISIT FOR REFILLS* 30 tablet 0  . furosemide (LASIX) 40 MG tablet Take 1.5 tablets (60 mg total) by mouth 2 (two) times daily. 270 tablet 3  . glimepiride (AMARYL) 2 MG tablet Take 1-2 mg by mouth See admin instructions. Take 4 mg by mouth every morning and 2 mg by mouth tablets every evening.    . insulin glargine (LANTUS) 100 UNIT/ML injection Inject 10 Units into the skin at bedtime.    . metFORMIN (GLUCOPHAGE) 1000 MG tablet Take 1 tablet (1,000 mg total) by mouth 2 (two) times daily with a meal. Needs ov. 60 tablet 0  . metoprolol succinate (TOPROL-XL) 25 MG 24 hr tablet TAKE 1 TABLET (25 MG TOTAL) BY MOUTH 2 (TWO) TIMES DAILY. 60 tablet 6  . potassium chloride (KLOR-CON M10) 10 MEQ tablet TAKE 1 TABLET BY MOUTH TWICE A DAY.  NEEDS OV. 60 tablet 0  . rosuvastatin (CRESTOR) 10 MG tablet Take 10 mg by mouth daily.    . sacubitril-valsartan (ENTRESTO) 49-51 MG Take 1 tablet by mouth 2 (two) times daily. 60 tablet 11   No current facility-administered medications for this encounter.     BP 113/60   Pulse 93   Wt 123.9 kg (273 lb 2 oz)   SpO2 97%   BMI 44.08 kg/m  General: NAD Neck: JVP 8-9 cm, no thyromegaly or thyroid nodule.  Lungs: Rhonchi bilaterally.  CV: Nondisplaced PMI.  Heart irregular S1/S2, no S3/S4, no murmur.  1+ edema to knees bilaterally.  No carotid bruit.  Normal pedal pulses.  Abdomen: Soft, nontender, no hepatosplenomegaly, no distention.  Skin: Intact without lesions or rashes.   Neurologic: Alert and oriented x 3.  Psych: Normal affect. Extremities: No clubbing or cyanosis.  HEENT: Normal.   Assessment/Plan: 1. Atrial fibrillation/flutter: Paroxysmal.  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 She was admitted in 1/18 with atrial fibrillation/RVR and CHF.  She was diuresed and had DCCV back to NSR.  She had atrial fibrillation ablation in 4/18.  In 5/19, she went into atrial fibrillation transiently.  Today, she is in atypical atrial flutter.  This appears to have led to an exacerbation of diastolic CHF.   - Continue Eliquis.  CBC today.  - Given another episode of symptomatic atrial arrhythmia since afib ablation, I am going to send her to atrial fibrillation clinic to arrange for a  Tikosyn admission.  She does not want another ablation.  If she remains in atrial flutter/fibrillation after Tikosyn load, would plan DCCV.  2. Claudication: Left leg claudication with absent left PT pulse.  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   3. Chronic systolic => diastolic CHF: EF 21-11% on echo in 1/18, down from 50-55% in 4/15.  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  NYHA class II-III symptoms, she is volume overloaded in the setting of recurrent atrial arrhythmia (atypical flutter).  - Increase Lasix to 60 mg bid.  BMET today and again in 10 days.  - Continue Toprol XL 25 mg bid.  - Continue Entresto 49/51 bid  - ?rash from spironolactone, now on eplerenone 25 mg daily.    4. CAD: Coronary CTA showed coronary calcium score in the 82nd percentile with nonobstructive coronary disease.  - not on ASA 81 as she is taking Eliqius.  - Continue Crestor.  Check lipids today.  5. COPD: She has finally quit smoking.    Plan Tikosyn admission, then will see back in this office in 1 month.    [name] 04/02/2018

## 2018-04-03 ENCOUNTER — Telehealth: Payer: Self-pay | Admitting: Family Medicine

## 2018-04-03 NOTE — Telephone Encounter (Signed)
Copied from [facility] [id number]. Topic: Quick Communication - Rx Refill/Question >> Apr 03, 2018  8:59 AM Ahmed Prima L wrote: Medication: metFORMIN (GLUCOPHAGE) 1000 MG tablet  Has the patient contacted their pharmacy? yes (Agent: If no, request that the patient contact the pharmacy for the refill.) (Agent: If yes, when and what did the pharmacy advise?)  Preferred Pharmacy (with phone number or street name): [id number] - [place], [address] [place] [address]    Agent: Please be advised that RX refills may take up to 3 business days. We ask that you follow-up with your pharmacy.

## 2018-04-03 NOTE — Telephone Encounter (Signed)
Pt called with request for reorder of Metformin. Pt has no recorded CPE  in last 2 years. Last A1C 10/11/15.  Metformin was last refilled 02/25/18 with 0 refills. Pt was called and appointment made for CPE  06/28/18. Per instruction from flow coordinator Raquel Sarna, pt was scheduled for an office visit Friday 04/05/18 for a medication check with Dr Raliegh Scarlet. Pt states she has enough metformin until  office visit. Pt verbally requested a refill on her potassium which has already been sent to her PCP.  Another attempt was made to contact Ms. Piloto about this but I received busy signal. Per flow coordinator this medication is being addressed by cardiology and will be ok until Friday scheduled medication check.

## 2018-04-04 NOTE — Telephone Encounter (Signed)
Potassium sent 04/03/2018.

## 2018-04-05 ENCOUNTER — Encounter: Payer: Self-pay | Admitting: Family Medicine

## 2018-04-05 ENCOUNTER — Ambulatory Visit: Payer: Medicare Other | Admitting: Family Medicine

## 2018-04-05 VITALS — BP 107/58 | HR 109 | Temp 98.0°F | Resp 16 | Ht 66.0 in | Wt 269.4 lb

## 2018-04-05 DIAGNOSIS — I5023 Acute on chronic systolic (congestive) heart failure: Secondary | ICD-10-CM

## 2018-04-05 DIAGNOSIS — Z794 Long term (current) use of insulin: Secondary | ICD-10-CM

## 2018-04-05 DIAGNOSIS — E1165 Type 2 diabetes mellitus with hyperglycemia: Secondary | ICD-10-CM

## 2018-04-05 DIAGNOSIS — E7800 Pure hypercholesterolemia, unspecified: Secondary | ICD-10-CM

## 2018-04-05 DIAGNOSIS — J42 Unspecified chronic bronchitis: Secondary | ICD-10-CM | POA: Diagnosis not present

## 2018-04-05 DIAGNOSIS — I4891 Unspecified atrial fibrillation: Secondary | ICD-10-CM

## 2018-04-05 DIAGNOSIS — I10 Essential (primary) hypertension: Secondary | ICD-10-CM

## 2018-04-05 DIAGNOSIS — E876 Hypokalemia: Secondary | ICD-10-CM

## 2018-04-05 MED ORDER — POTASSIUM CHLORIDE CRYS ER 10 MEQ PO TBCR: EXTENDED_RELEASE_TABLET | ORAL | 2 refills | Status: DC

## 2018-04-05 NOTE — Progress Notes (Signed)
Patient ID: Heidi Stark, female    DOB: Sep 21, 1940  Age: 77 y.o. MRN: [id number]    Subjective:  Subjective  HPI Heidi Stark presents for f/u chol    Pt seeing cardio for a fib and chf.  Endo for dm.   Pt c/o fatigue due to chf and a fib.   Pt is scheduled for card procedure next week   They are hoping it helps with her symptoms    Review of Systems  Constitutional: Negative for fever.  HENT: Negative for congestion.   Respiratory: Negative for shortness of breath.   Cardiovascular: Negative for chest pain, palpitations and leg swelling.  Gastrointestinal: Negative for abdominal pain, blood in stool and nausea.  Genitourinary: Negative for dysuria and frequency.  Skin: Negative for rash.  Allergic/Immunologic: Negative for environmental allergies.  Neurological: Negative for dizziness and headaches.  Psychiatric/Behavioral: The patient is not nervous/anxious.     History Past Medical History:  Diagnosis Date  . Arthritis    "hands, wrists" (11/07/2016)  . CHF (congestive heart failure) ([place])   . Chronic lower back pain   . COPD (chronic obstructive pulmonary disease) ([place])   . DVT (deep venous thrombosis) ([place])   . History of blood transfusion 1990   "related to OR"  . History of kidney stones   . Hyperlipidemia   . Hypertension   . On home oxygen therapy    "2L w/CPAP when I sleep" (11/07/2016)  . OSA on CPAP   . PAF (paroxysmal atrial fibrillation) ([place])   . Pneumonia    "several times" (11/07/2016)  . Type II diabetes mellitus ([place])     She has a past surgical history that includes Tubal ligation; Cystectomy; Ganglion cyst excision (Left); TEE without cardioversion (N/A, 10/31/2013); Colonoscopy; Cardioversion (N/A, 08/11/2016); ATRIAL FIBRILLATION ABLATION (N/A, 11/07/2016); Knee cartilage surgery (Right, 1960s); Lumbar disc surgery (1990); Repair iliac artery (1990); Colon surgery (1990); Back surgery; Cataract extraction w/ intraocular lens  implant, bilateral  (Bilateral); Cardiac catheterization; and Cardioversion (N/A, 12/03/2017).   Her family history includes Diabetes in her unknown relative; Factor V Leiden deficiency in her daughter; Melanoma in her unknown relative.She reports that she quit smoking about 17 months ago. Her smoking use included cigarettes. She has a 45.00 pack-year smoking history. She has never used smokeless tobacco. She reports that she does not drink alcohol or use drugs.  Current Outpatient Medications on File Prior to Visit  Medication Sig Dispense Refill  . acetaminophen (TYLENOL) 650 MG CR tablet Take 650 mg by mouth every 8 (eight) hours as needed for pain.    Marland Kitchen ELIQUIS 5 MG TABS tablet TAKE 1 TABLET BY MOUTH TWICE A DAY 180 tablet 1  . eplerenone (INSPRA) 25 MG tablet TAKE 1 TABLET BY MOUTH EVERY DAY 90 tablet 3  . fenofibrate 160 MG tablet TAKE 1 TABLET BY MOUTH DAILY *NEED OFFICE VISIT FOR REFILLS* 30 tablet 0  . furosemide (LASIX) 40 MG tablet Take 1.5 tablets (60 mg total) by mouth 2 (two) times daily. 270 tablet 3  . glimepiride (AMARYL) 2 MG tablet Take 1-2 mg by mouth See admin instructions. Take 4 mg by mouth every morning and 2 mg by mouth tablets every evening.    . insulin glargine (LANTUS) 100 UNIT/ML injection Inject 10 Units into the skin at bedtime.    . metFORMIN (GLUCOPHAGE) 1000 MG tablet Take 1 tablet (1,000 mg total) by mouth 2 (two) times daily with a meal. Needs ov. 60 tablet 0  .  metoprolol succinate (TOPROL-XL) 25 MG 24 hr tablet TAKE 1 TABLET (25 MG TOTAL) BY MOUTH 2 (TWO) TIMES DAILY. 60 tablet 6  . rosuvastatin (CRESTOR) 10 MG tablet Take 10 mg by mouth daily.    . sacubitril-valsartan (ENTRESTO) 49-51 MG Take 1 tablet by mouth 2 (two) times daily. 60 tablet 11   No current facility-administered medications on file prior to visit.      Objective:  Objective  Physical Exam  Constitutional: She is oriented to person, place, and time. She appears well-developed and well-nourished.  HENT:    Head: Normocephalic and atraumatic.  Eyes: Conjunctivae and EOM are normal.  Neck: Normal range of motion. Neck supple. No JVD present. Carotid bruit is not present. No thyromegaly present.  Cardiovascular: Normal rate, regular rhythm and normal heart sounds.  No murmur heard. Pulmonary/Chest: Effort normal and breath sounds normal. No respiratory distress. She has no wheezes. She has no rales. She exhibits no tenderness.  Musculoskeletal: She exhibits edema. She exhibits no tenderness.  Neurological: She is alert and oriented to person, place, and time.  Psychiatric: She has a normal mood and affect.  Nursing note and vitals reviewed.  BP (!) 107/58 (BP Location: Right Arm, Cuff Size: Large)   Pulse (!) 109   Temp 98 F (36.7 C) (Oral)   Resp 16   Ht 5\' 6"  (1.676 m)   Wt 269 lb 6.4 oz (122.2 kg)   SpO2 94%   BMI 43.48 kg/m  Wt Readings from Last 3 Encounters:  04/05/18 269 lb 6.4 oz (122.2 kg)  04/01/18 273 lb 2 oz (123.9 kg)  01/04/18 274 lb (124.3 kg)     Lab Results  Component Value Date   WBC 8.8 04/01/2018   HGB 14.7 04/01/2018   HCT 46.6 (H) 04/01/2018   PLT 283 04/01/2018   GLUCOSE 193 (H) 04/01/2018   CHOL 139 04/01/2018   TRIG 380 (H) 04/01/2018   HDL 29 (L) 04/01/2018   LDLDIRECT 42.0 11/30/2014   LDLCALC 34 04/01/2018   ALT 15 12/03/2017   AST 54 (H) 12/03/2017   NA 140 04/01/2018   K 3.8 04/01/2018   CL 99 04/01/2018   CREATININE 0.83 04/01/2018   BUN 14 04/01/2018   CO2 29 04/01/2018   TSH 4.964 (H) 01/01/2017   INR 2.7 03/09/2014   HGBA1C 7.7 (H) 10/11/2015   MICROALBUR 9.3 (H) 11/30/2014    No results found.   Assessment & Plan:  Plan  I have discontinued Heidi Burkitt. Stark's apixaban. I have also changed her potassium chloride. Additionally, I am having her maintain her glimepiride, insulin glargine, metoprolol succinate, rosuvastatin, acetaminophen, sacubitril-valsartan, eplerenone, metFORMIN, ELIQUIS, fenofibrate, and furosemide.  Meds  ordered this encounter  Medications  . potassium chloride (KLOR-CON M10) 10 MEQ tablet    Sig: Take 2 in am and 1 in pm    Dispense:  90 tablet    Refill:  2    Problem List Items Addressed This Visit      Unprioritized   Acute on chronic systolic CHF (congestive heart failure) ([place])    Per cardiology      Atrial fibrillation with rapid ventricular response ([place])    Per cardiology      COPD (chronic obstructive pulmonary disease) ([place])    Per pulm      Essential hypertension (Chronic)    Well controlled, no changes to meds. Encouraged heart healthy diet such as the DASH diet and exercise as tolerated.  HLD (hyperlipidemia)    Tolerating statin, encouraged heart healthy diet, avoid trans fats, minimize simple carbs and saturated fats. Increase exercise as tolerated      Hypokalemia - Primary   Relevant Medications   potassium chloride (KLOR-CON M10) 10 MEQ tablet   Type 2 diabetes mellitus with hyperglycemia, with long-term current use of insulin ([place])    Per endo         Follow-up: Return in about 6 months (around 10/04/2018), or if symptoms worsen or fail to improve.  Ann Held, DO

## 2018-04-05 NOTE — Patient Instructions (Signed)

## 2018-04-07 NOTE — Assessment & Plan Note (Signed)
Well controlled, no changes to meds. Encouraged heart healthy diet such as the DASH diet and exercise as tolerated.  °

## 2018-04-07 NOTE — Assessment & Plan Note (Signed)
Per cardiology 

## 2018-04-07 NOTE — Assessment & Plan Note (Signed)
Per pulm 

## 2018-04-07 NOTE — Assessment & Plan Note (Signed)
Tolerating statin, encouraged heart healthy diet, avoid trans fats, minimize simple carbs and saturated fats. Increase exercise as tolerated 

## 2018-04-07 NOTE — Assessment & Plan Note (Signed)
Per endo °

## 2018-04-08 ENCOUNTER — Ambulatory Visit (HOSPITAL_COMMUNITY): Payer: Medicare Other | Admitting: Nurse Practitioner

## 2018-04-11 ENCOUNTER — Ambulatory Visit (HOSPITAL_COMMUNITY)
Admission: RE | Admit: 2018-04-11 | Discharge: 2018-04-11 | Disposition: A | Discharge status: Home / Self Care | Payer: Medicare Other | Source: Ambulatory Visit | Attending: Cardiology | Admitting: Cardiology

## 2018-04-11 DIAGNOSIS — I5022 Chronic systolic (congestive) heart failure: Secondary | ICD-10-CM | POA: Insufficient documentation

## 2018-04-11 LAB — BASIC METABOLIC PANEL
ANION GAP: 14 (ref 5–15)
BUN: 18 mg/dL (ref 8–23)
CO2: 27 mmol/L (ref 22–32)
Calcium: 8.9 mg/dL (ref 8.9–10.3)
Chloride: 98 mmol/L (ref 98–111)
Creatinine, Ser: 1.01 mg/dL — ABNORMAL HIGH (ref 0.44–1.00)
GFR calc Af Amer: >60 mL/min (ref >60)
GFR calc non Af Amer: 53 mL/min — ABNORMAL LOW (ref >60)
Glucose, Bld: 190 mg/dL — ABNORMAL HIGH (ref 70–99)
POTASSIUM: 4.2 mmol/L (ref 3.5–5.1)
Sodium: 139 mmol/L (ref 135–145)

## 2018-04-22 ENCOUNTER — Other Ambulatory Visit (HOSPITAL_COMMUNITY): Payer: Self-pay | Admitting: *Deleted

## 2018-04-22 ENCOUNTER — Encounter (HOSPITAL_COMMUNITY): Payer: Self-pay | Admitting: Nurse Practitioner

## 2018-04-22 ENCOUNTER — Inpatient Hospital Stay (HOSPITAL_COMMUNITY): Admission: AD | Admit: 2018-04-22 | Payer: Medicare Other | Source: Ambulatory Visit | Admitting: Internal Medicine

## 2018-04-22 ENCOUNTER — Ambulatory Visit (HOSPITAL_COMMUNITY)
Admission: RE | Admit: 2018-04-22 | Discharge: 2018-04-22 | Disposition: A | Discharge status: Home / Self Care | Payer: Medicare Other | Source: Ambulatory Visit | Attending: Nurse Practitioner | Admitting: Nurse Practitioner

## 2018-04-22 VITALS — BP 108/64 | HR 104 | Ht 66.0 in | Wt 270.0 lb

## 2018-04-22 DIAGNOSIS — I509 Heart failure, unspecified: Secondary | ICD-10-CM | POA: Diagnosis not present

## 2018-04-22 DIAGNOSIS — Z87891 Personal history of nicotine dependence: Secondary | ICD-10-CM | POA: Insufficient documentation

## 2018-04-22 DIAGNOSIS — Z86718 Personal history of other venous thrombosis and embolism: Secondary | ICD-10-CM | POA: Diagnosis not present

## 2018-04-22 DIAGNOSIS — Z87442 Personal history of urinary calculi: Secondary | ICD-10-CM | POA: Insufficient documentation

## 2018-04-22 DIAGNOSIS — E119 Type 2 diabetes mellitus without complications: Secondary | ICD-10-CM | POA: Insufficient documentation

## 2018-04-22 DIAGNOSIS — M1990 Unspecified osteoarthritis, unspecified site: Secondary | ICD-10-CM | POA: Insufficient documentation

## 2018-04-22 DIAGNOSIS — G4733 Obstructive sleep apnea (adult) (pediatric): Secondary | ICD-10-CM | POA: Diagnosis not present

## 2018-04-22 DIAGNOSIS — Z882 Allergy status to sulfonamides status: Secondary | ICD-10-CM | POA: Insufficient documentation

## 2018-04-22 DIAGNOSIS — J449 Chronic obstructive pulmonary disease, unspecified: Secondary | ICD-10-CM | POA: Diagnosis not present

## 2018-04-22 DIAGNOSIS — I110 Hypertensive heart disease with heart failure: Secondary | ICD-10-CM | POA: Diagnosis not present

## 2018-04-22 DIAGNOSIS — Z9981 Dependence on supplemental oxygen: Secondary | ICD-10-CM | POA: Insufficient documentation

## 2018-04-22 DIAGNOSIS — E785 Hyperlipidemia, unspecified: Secondary | ICD-10-CM | POA: Diagnosis not present

## 2018-04-22 DIAGNOSIS — Z9889 Other specified postprocedural states: Secondary | ICD-10-CM | POA: Insufficient documentation

## 2018-04-22 DIAGNOSIS — I4819 Other persistent atrial fibrillation: Secondary | ICD-10-CM

## 2018-04-22 DIAGNOSIS — E876 Hypokalemia: Secondary | ICD-10-CM

## 2018-04-22 DIAGNOSIS — Z888 Allergy status to other drugs, medicaments and biological substances status: Secondary | ICD-10-CM | POA: Insufficient documentation

## 2018-04-22 LAB — BASIC METABOLIC PANEL
Anion gap: 11 (ref 5–15)
BUN: 15 mg/dL (ref 8–23)
CO2: 27 mmol/L (ref 22–32)
Calcium: 8.6 mg/dL — ABNORMAL LOW (ref 8.9–10.3)
Chloride: 99 mmol/L (ref 98–111)
Creatinine, Ser: 0.71 mg/dL (ref 0.44–1.00)
GFR calc Af Amer: >60 mL/min (ref >60)
Glucose, Bld: 76 mg/dL (ref 70–99)
POTASSIUM: 3.3 mmol/L — AB (ref 3.5–5.1)
Sodium: 137 mmol/L (ref 135–145)

## 2018-04-22 LAB — MAGNESIUM: Magnesium: 2.1 mg/dL (ref 1.7–2.4)

## 2018-04-22 MED ORDER — POTASSIUM CHLORIDE CRYS ER 10 MEQ PO TBCR
30.0000 meq | EXTENDED_RELEASE_TABLET | Freq: Two times a day (BID) | ORAL | 3 refills | Status: DC
Start: 2018-04-22 — End: 2018-07-22

## 2018-04-22 NOTE — Progress Notes (Addendum)
Primary Care Physician: [name], [name], DO Referring Physician: Dr. Horald Chestnut Heidi Stark is a 77 y.o. female with a h/o persistent afib, s/p ablation, previously on amiodarone , stopped many months ago, in the afib clinic for Tikosyn admit. Pt aware of the price of the drug   Today, she denies symptoms of palpitations, chest pain, shortness of breath, orthopnea, PND, lower extremity edema, dizziness, presyncope, syncope, or neurologic sequela. The patient is tolerating medications without difficulties and is otherwise without complaint today.   Past Medical History:  Diagnosis Date  . Arthritis    "hands, wrists" (11/07/2016)  . CHF (congestive heart failure) ([place])   . Chronic lower back pain   . COPD (chronic obstructive pulmonary disease) ([place])   . DVT (deep venous thrombosis) ([place])   . History of blood transfusion 1990   "related to OR"  . History of kidney stones   . Hyperlipidemia   . Hypertension   . On home oxygen therapy    "2L w/CPAP when I sleep" (11/07/2016)  . OSA on CPAP   . PAF (paroxysmal atrial fibrillation) ([place])   . Pneumonia    "several times" (11/07/2016)  . Type II diabetes mellitus ([place])    Past Surgical History:  Procedure Laterality Date  . ATRIAL FIBRILLATION ABLATION N/A 11/07/2016   Procedure: Atrial Fibrillation Ablation;  Surgeon: [name], MD;  Location: [facility];  Service: Cardiovascular;  Laterality: N/A;  . BACK SURGERY    . CARDIAC CATHETERIZATION    . CARDIOVERSION N/A 08/11/2016   Procedure: CARDIOVERSION;  Surgeon: Larey Dresser, MD;  Location[facility] ENDOSCOPY;  Service: Cardiovascular;  Laterality: N/A;  . CARDIOVERSION N/A 12/03/2017   Procedure: CARDIOVERSION;  Surgeon: Larey Dresser, MD;  Location: [facility] ENDOSCOPY;  Service: Cardiovascular;  Laterality: N/A;  . CATARACT EXTRACTION W/ INTRAOCULAR LENS  IMPLANT, BILATERAL Bilateral   . COLON SURGERY  1990   vein graft and colon repair after nicked artery with  back surgert  . COLONOSCOPY    . CYSTECTOMY     between bladder and kidneys  . GANGLION CYST EXCISION Left   . KNEE CARTILAGE SURGERY Right 1960s  . [place] SURGERY  1990  . REPAIR ILIAC ARTERY  1990   vein graft and colon repair after nicked artery with back surgert  . TEE WITHOUT CARDIOVERSION N/A 10/31/2013   Procedure: TRANSESOPHAGEAL ECHOCARDIOGRAM (TEE);  Surgeon: Larey Dresser, MD;  Location: [facility];  Service: Cardiovascular;  Laterality: N/A;  . TUBAL LIGATION      No current outpatient medications on file.   No current facility-administered medications for this encounter.     Allergies  Allergen Reactions  . Neomycin-Bacitracin Zn-Polymyx Rash  . Sulfonamide Derivatives Other (See Comments)    Stomach cramps  . Ciprofloxacin Hives, Itching and Rash  . Spironolactone Rash    Social History   Socioeconomic History  . Marital status: Married    Spouse name: Not on file  . Number of children: 2  . Years of education: Not on file  . Highest education level: Not on file  Occupational History  . Occupation: Retired  Scientific laboratory technician  . Financial resource strain: Not on file  . Food insecurity:    Worry: Not on file    Inability: Not on file  . Transportation needs:    Medical: Not on file    Non-medical: Not on file  Tobacco Use  . Smoking status: Former Smoker  Packs/day: 1.00    Years: 45.00    Pack years: 45.00    Types: Cigarettes    Last attempt to quit: 10/15/2016    Years since quitting: 1.5  . Smokeless tobacco: Never Used  Substance and Sexual Activity  . Alcohol use: No    Alcohol/week: 0.0 standard drinks  . Drug use: No  . Sexual activity: Not on file  Lifestyle  . Physical activity:    Days per week: Not on file    Minutes per session: Not on file  . Stress: Not on file  Relationships  . Social connections:    Talks on phone: Not on file    Gets together: Not on file    Attends religious service: Not on file    Active member  of club or organization: Not on file    Attends meetings of clubs or organizations: Not on file    Relationship status: Not on file  . Intimate partner violence:    Fear of current or ex partner: Not on file    Emotionally abused: Not on file    Physically abused: Not on file    Forced sexual activity: Not on file  Other Topics Concern  . Not on file  Social History Narrative  . Not on file    Family History  Problem Relation Age of Onset  . Diabetes Unknown   . Melanoma Unknown   . Factor V Leiden deficiency Daughter     ROS- All systems are reviewed and negative except as per the HPI above  Physical Exam: Vitals:   04/22/18 1035  BP: 108/64  Pulse: (!) 104  Weight: 122.5 kg  Height: 5\' 6"  (1.676 m)   Wt Readings from Last 3 Encounters:  04/22/18 122.5 kg  04/05/18 122.2 kg  04/01/18 123.9 kg    Labs: Lab Results  Component Value Date   NA 137 04/22/2018   K 3.3 (L) 04/22/2018   CL 99 04/22/2018   CO2 27 04/22/2018   GLUCOSE 76 04/22/2018   BUN 15 04/22/2018   CREATININE 0.71 04/22/2018   CALCIUM 8.6 (L) 04/22/2018   MG 2.1 04/22/2018   Lab Results  Component Value Date   INR 2.7 03/09/2014   Lab Results  Component Value Date   CHOL 139 04/01/2018   HDL 29 (L) 04/01/2018   LDLCALC 34 04/01/2018   TRIG 380 (H) 04/01/2018     GEN- The patient is well appearing, alert and oriented x 3 today.   Head- normocephalic, atraumatic Eyes-  Sclera clear, conjunctiva pink Ears- hearing intact Oropharynx- clear Neck- supple, no JVP Lymph- no cervical lymphadenopathy Lungs- Clear to ausculation bilaterally, normal work of breathing Heart- Regular rate and rhythm, no murmurs, rubs or gallops, PMI not laterally displaced GI- soft, NT, ND, + BS Extremities- no clubbing, cyanosis, or edema MS- no significant deformity or atrophy Skin- no rash or lesion Psych- euthymic mood, full affect Neuro- strength and sensation are intact  EKG-ectopic atrial  tachycardia    Assessment and Plan: 1. Persistent afib Pt has not missed any doses of eliquis No benadryl  use Is aware if the price of drug and will be able to get thru pt assistance Bmet/mag pending qtc 428 ms Labs did show  a K+ of 3.3 and it is best thought not to admit pt today, as correcting K+ may cause extra days in the hospital, but correct K+ and bring back next Monday for Tikosyn admit Will increase kt supplementation  to 30 meq bid, bring back Friday for repeat bmet before admission date next Monday  Butch Penny C. Azaliah Carrero, [facility] [address] [place], [place] [address] [phone]

## 2018-04-24 ENCOUNTER — Ambulatory Visit (HOSPITAL_COMMUNITY): Admission: RE | Admit: 2018-04-24 | Payer: Medicare Other | Source: Ambulatory Visit | Admitting: Cardiology

## 2018-04-24 SURGERY — CARDIOVERSION
Anesthesia: General

## 2018-04-25 ENCOUNTER — Telehealth: Payer: Self-pay | Admitting: Internal Medicine

## 2018-04-25 ENCOUNTER — Other Ambulatory Visit: Payer: Self-pay | Admitting: Family Medicine

## 2018-04-25 DIAGNOSIS — E1151 Type 2 diabetes mellitus with diabetic peripheral angiopathy without gangrene: Secondary | ICD-10-CM

## 2018-04-25 NOTE — Telephone Encounter (Signed)
New Message    Angeliec is calling from [facility] to request a cpt code and date of surgery for the pt's procedure. States she needs a call back asap because the case can not be processed until she gets an answer. Please call

## 2018-04-26 ENCOUNTER — Ambulatory Visit (HOSPITAL_COMMUNITY)
Admission: RE | Admit: 2018-04-26 | Discharge: 2018-04-26 | Disposition: A | Discharge status: Home / Self Care | Payer: Medicare Other | Source: Ambulatory Visit | Attending: Nurse Practitioner | Admitting: Nurse Practitioner

## 2018-04-26 DIAGNOSIS — I4819 Other persistent atrial fibrillation: Secondary | ICD-10-CM | POA: Insufficient documentation

## 2018-04-26 LAB — BASIC METABOLIC PANEL WITH GFR
Anion gap: 8 (ref 5–15)
BUN: 15 mg/dL (ref 8–23)
CO2: 29 mmol/L (ref 22–32)
Calcium: 8.8 mg/dL — ABNORMAL LOW (ref 8.9–10.3)
Chloride: 101 mmol/L (ref 98–111)
Creatinine, Ser: 0.77 mg/dL (ref 0.44–1.00)
GFR calc Af Amer: >60 mL/min (ref >60)
GFR calc non Af Amer: >60 mL/min (ref >60)
Glucose, Bld: 142 mg/dL — ABNORMAL HIGH (ref 70–99)
Potassium: 4.2 mmol/L (ref 3.5–5.1)
Sodium: 138 mmol/L (ref 135–145)

## 2018-04-26 NOTE — Telephone Encounter (Signed)
Follow Up:      Angelica with [employer] is requesting CPT Code for the surgery authorization, the request will be cancelled if there no call back by today by 4pm   Contact [phone]

## 2018-04-26 NOTE — Telephone Encounter (Signed)
Information needed was provided to Angelica.

## 2018-04-30 ENCOUNTER — Ambulatory Visit (HOSPITAL_COMMUNITY)
Admission: RE | Admit: 2018-04-30 | Discharge: 2018-04-30 | Disposition: A | Discharge status: Home / Self Care | Payer: Medicare Other | Source: Ambulatory Visit | Attending: Nurse Practitioner | Admitting: Nurse Practitioner

## 2018-04-30 ENCOUNTER — Other Ambulatory Visit: Payer: Self-pay

## 2018-04-30 ENCOUNTER — Encounter (HOSPITAL_COMMUNITY): Payer: Self-pay | Admitting: Nurse Practitioner

## 2018-04-30 ENCOUNTER — Encounter (HOSPITAL_COMMUNITY): Payer: Self-pay | Admitting: General Practice

## 2018-04-30 ENCOUNTER — Inpatient Hospital Stay (HOSPITAL_COMMUNITY)
Admission: AD | Admit: 2018-04-30 | Discharge: 2018-05-04 | DRG: 309 | Disposition: A | Discharge status: Home / Self Care | Payer: Medicare Other | Source: Ambulatory Visit | Attending: Internal Medicine | Admitting: Internal Medicine

## 2018-04-30 VITALS — BP 104/64 | HR 107 | Ht 66.0 in | Wt 268.0 lb

## 2018-04-30 DIAGNOSIS — Z9981 Dependence on supplemental oxygen: Secondary | ICD-10-CM | POA: Diagnosis not present

## 2018-04-30 DIAGNOSIS — E1151 Type 2 diabetes mellitus with diabetic peripheral angiopathy without gangrene: Secondary | ICD-10-CM | POA: Diagnosis present

## 2018-04-30 DIAGNOSIS — Z7901 Long term (current) use of anticoagulants: Secondary | ICD-10-CM

## 2018-04-30 DIAGNOSIS — Z9841 Cataract extraction status, right eye: Secondary | ICD-10-CM | POA: Diagnosis not present

## 2018-04-30 DIAGNOSIS — Z86718 Personal history of other venous thrombosis and embolism: Secondary | ICD-10-CM | POA: Diagnosis not present

## 2018-04-30 DIAGNOSIS — I110 Hypertensive heart disease with heart failure: Secondary | ICD-10-CM | POA: Diagnosis present

## 2018-04-30 DIAGNOSIS — I4892 Unspecified atrial flutter: Secondary | ICD-10-CM

## 2018-04-30 DIAGNOSIS — Z87891 Personal history of nicotine dependence: Secondary | ICD-10-CM | POA: Diagnosis not present

## 2018-04-30 DIAGNOSIS — Z882 Allergy status to sulfonamides status: Secondary | ICD-10-CM

## 2018-04-30 DIAGNOSIS — I4819 Other persistent atrial fibrillation: Principal | ICD-10-CM | POA: Diagnosis present

## 2018-04-30 DIAGNOSIS — Z6841 Body Mass Index (BMI) 40.0 and over, adult: Secondary | ICD-10-CM

## 2018-04-30 DIAGNOSIS — I484 Atypical atrial flutter: Secondary | ICD-10-CM | POA: Diagnosis present

## 2018-04-30 DIAGNOSIS — J449 Chronic obstructive pulmonary disease, unspecified: Secondary | ICD-10-CM | POA: Diagnosis present

## 2018-04-30 DIAGNOSIS — Z881 Allergy status to other antibiotic agents status: Secondary | ICD-10-CM | POA: Diagnosis not present

## 2018-04-30 DIAGNOSIS — Z87442 Personal history of urinary calculi: Secondary | ICD-10-CM

## 2018-04-30 DIAGNOSIS — Z794 Long term (current) use of insulin: Secondary | ICD-10-CM | POA: Diagnosis not present

## 2018-04-30 DIAGNOSIS — Z961 Presence of intraocular lens: Secondary | ICD-10-CM | POA: Diagnosis present

## 2018-04-30 DIAGNOSIS — E785 Hyperlipidemia, unspecified: Secondary | ICD-10-CM | POA: Diagnosis present

## 2018-04-30 DIAGNOSIS — G4733 Obstructive sleep apnea (adult) (pediatric): Secondary | ICD-10-CM | POA: Diagnosis present

## 2018-04-30 DIAGNOSIS — G8929 Other chronic pain: Secondary | ICD-10-CM | POA: Diagnosis present

## 2018-04-30 DIAGNOSIS — Z9842 Cataract extraction status, left eye: Secondary | ICD-10-CM | POA: Diagnosis not present

## 2018-04-30 DIAGNOSIS — Z79899 Other long term (current) drug therapy: Secondary | ICD-10-CM | POA: Diagnosis not present

## 2018-04-30 DIAGNOSIS — I5032 Chronic diastolic (congestive) heart failure: Secondary | ICD-10-CM | POA: Diagnosis present

## 2018-04-30 HISTORY — DX: Other persistent atrial fibrillation: I48.19

## 2018-04-30 LAB — BASIC METABOLIC PANEL
Anion gap: 8 (ref 5–15)
BUN: 18 mg/dL (ref 8–23)
CHLORIDE: 103 mmol/L (ref 98–111)
CO2: 27 mmol/L (ref 22–32)
CREATININE: 0.89 mg/dL (ref 0.44–1.00)
Calcium: 8.7 mg/dL — ABNORMAL LOW (ref 8.9–10.3)
GFR calc non Af Amer: >60 mL/min (ref >60)
Glucose, Bld: 121 mg/dL — ABNORMAL HIGH (ref 70–99)
Potassium: 3.9 mmol/L (ref 3.5–5.1)
Sodium: 138 mmol/L (ref 135–145)

## 2018-04-30 LAB — MAGNESIUM: Magnesium: 2.1 mg/dL (ref 1.7–2.4)

## 2018-04-30 LAB — GLUCOSE, CAPILLARY
GLUCOSE-CAPILLARY: 212 mg/dL — AB (ref 70–99)
Glucose-Capillary: 247 mg/dL — ABNORMAL HIGH (ref 70–99)

## 2018-04-30 MED ORDER — FENOFIBRATE 160 MG PO TABS
160.0000 mg | ORAL_TABLET | Freq: Every day | ORAL | Status: DC
  Filled 2018-04-30: qty 1

## 2018-04-30 MED ORDER — METFORMIN HCL 500 MG PO TABS
1000.0000 mg | ORAL_TABLET | Freq: Two times a day (BID) | ORAL | Status: DC
  Administered 2018-04-30 – 2018-05-04 (×8): 1000 mg via ORAL
  Filled 2018-04-30 (×10): qty 2

## 2018-04-30 MED ORDER — FUROSEMIDE 40 MG PO TABS
60.0000 mg | ORAL_TABLET | Freq: Two times a day (BID) | ORAL | Status: DC
  Administered 2018-05-01 – 2018-05-04 (×7): 60 mg via ORAL
  Filled 2018-04-30 (×7): qty 1

## 2018-04-30 MED ORDER — INSULIN ASPART 100 UNIT/ML ~~LOC~~ SOLN
0.0000 [IU] | Freq: Three times a day (TID) | SUBCUTANEOUS | Status: DC
  Administered 2018-05-01: 1 [IU] via SUBCUTANEOUS
  Administered 2018-05-01: 2 [IU] via SUBCUTANEOUS
  Administered 2018-05-01: 5 [IU] via SUBCUTANEOUS
  Administered 2018-05-02 – 2018-05-03 (×2): 3 [IU] via SUBCUTANEOUS
  Administered 2018-05-03: 1 [IU] via SUBCUTANEOUS
  Administered 2018-05-04: 3 [IU] via SUBCUTANEOUS

## 2018-04-30 MED ORDER — METOPROLOL SUCCINATE ER 25 MG PO TB24
25.0000 mg | ORAL_TABLET | Freq: Two times a day (BID) | ORAL | Status: DC
  Administered 2018-04-30 – 2018-05-04 (×8): 25 mg via ORAL
  Filled 2018-04-30 (×8): qty 1

## 2018-04-30 MED ORDER — SACUBITRIL-VALSARTAN 49-51 MG PO TABS
1.0000 | ORAL_TABLET | Freq: Two times a day (BID) | ORAL | Status: DC
  Administered 2018-04-30 – 2018-05-04 (×8): 1 via ORAL
  Filled 2018-04-30 (×8): qty 1

## 2018-04-30 MED ORDER — GLIMEPIRIDE 1 MG PO TABS
2.0000 mg | ORAL_TABLET | Freq: Every day | ORAL | Status: DC
  Administered 2018-04-30 – 2018-05-03 (×4): 2 mg via ORAL
  Filled 2018-04-30 (×4): qty 2

## 2018-04-30 MED ORDER — ROSUVASTATIN CALCIUM 10 MG PO TABS
10.0000 mg | ORAL_TABLET | Freq: Every day | ORAL | Status: DC
  Administered 2018-05-01 – 2018-05-04 (×4): 10 mg via ORAL
  Filled 2018-04-30 (×5): qty 1

## 2018-04-30 MED ORDER — SODIUM CHLORIDE 0.9% FLUSH
3.0000 mL | Freq: Two times a day (BID) | INTRAVENOUS | Status: DC
  Administered 2018-04-30 – 2018-05-04 (×8): 3 mL via INTRAVENOUS

## 2018-04-30 MED ORDER — SODIUM CHLORIDE 0.9 % IV SOLN
250.0000 mL | INTRAVENOUS | Status: DC | PRN
  Administered 2018-05-02: 11:00:00 via INTRAVENOUS

## 2018-04-30 MED ORDER — APIXABAN 5 MG PO TABS
5.0000 mg | ORAL_TABLET | Freq: Two times a day (BID) | ORAL | Status: DC
  Administered 2018-04-30 – 2018-05-04 (×8): 5 mg via ORAL
  Filled 2018-04-30 (×8): qty 1

## 2018-04-30 MED ORDER — SODIUM CHLORIDE 0.9% FLUSH: 3.0000 mL | INTRAVENOUS | Status: DC | PRN

## 2018-04-30 MED ORDER — INSULIN GLARGINE 100 UNIT/ML ~~LOC~~ SOLN
10.0000 [IU] | Freq: Every day | SUBCUTANEOUS | Status: DC
  Administered 2018-04-30 – 2018-05-03 (×4): 10 [IU] via SUBCUTANEOUS
  Filled 2018-04-30 (×5): qty 0.1

## 2018-04-30 MED ORDER — DOFETILIDE 500 MCG PO CAPS
500.0000 ug | ORAL_CAPSULE | Freq: Two times a day (BID) | ORAL | Status: DC
  Administered 2018-04-30 – 2018-05-01 (×3): 500 ug via ORAL
  Filled 2018-04-30 (×4): qty 1

## 2018-04-30 MED ORDER — POTASSIUM CHLORIDE CRYS ER 20 MEQ PO TBCR
30.0000 meq | EXTENDED_RELEASE_TABLET | Freq: Two times a day (BID) | ORAL | Status: DC
  Administered 2018-04-30 – 2018-05-04 (×8): 30 meq via ORAL
  Filled 2018-04-30 (×8): qty 1

## 2018-04-30 MED ORDER — GLIMEPIRIDE 4 MG PO TABS
4.0000 mg | ORAL_TABLET | Freq: Every day | ORAL | Status: DC
  Administered 2018-05-01 – 2018-05-04 (×4): 4 mg via ORAL
  Filled 2018-04-30: qty 1
  Filled 2018-04-30: qty 4
  Filled 2018-04-30: qty 1
  Filled 2018-04-30: qty 4

## 2018-04-30 MED ORDER — EPLERENONE 25 MG PO TABS
25.0000 mg | ORAL_TABLET | Freq: Every day | ORAL | Status: DC
  Administered 2018-05-01 – 2018-05-04 (×4): 25 mg via ORAL
  Filled 2018-04-30 (×4): qty 1

## 2018-04-30 MED ORDER — NON FORMULARY: 25.0000 mg | Freq: Every day | Status: DC

## 2018-04-30 NOTE — Progress Notes (Signed)
Pharmacy Review for Dofetilide (Tikosyn) Initiation  Admit Complaint: 77 y.o. female admitted 04/30/2018 with atrial fibrillation to be initiated on dofetilide.   Assessment:  Patient Exclusion Criteria: If any screening criteria checked as "Yes", then  patient  should NOT receive dofetilide until criteria item is corrected. If "Yes" please indicate correction plan.  YES  NO Patient  Exclusion Criteria Correction Plan  [x]  []  Baseline QTc interval is greater than or equal to 440 msec. IF above YES box checked dofetilide contraindicated unless patient has ICD; then may proceed if QTc 500-550 msec or with known ventricular conduction abnormalities may proceed with QTc 550-600 msec.  QTc = 469 MD aware and ok to continue  []  [x]  Magnesium level is less than 1.8 mEq/l : Last magnesium:  Lab Results  Component Value Date   MG 2.1 04/30/2018         [x]  []  Potassium level is less than 4 mEq/l : Last potassium:  Lab Results  Component Value Date   K 3.9 04/30/2018        MD aware and okay to continue - replacement ordered and will receive before tikosyn dose  []  [x]  Patient is known or suspected to have a digoxin level greater than 2 ng/ml: No results found for: DIGOXIN    []  [x]  Creatinine clearance less than 20 ml/min (calculated using Cockcroft-Gault, actual body weight and serum creatinine): Estimated Creatinine Clearance: 71.7 mL/min (by C-G formula based on SCr of 0.89 mg/dL).    []  [x]  Patient has received drugs known to prolong the QT intervals within the last 48 hours (phenothiazines, tricyclics or tetracyclic antidepressants, erythromycin, H-1 antihistamines, cisapride, fluoroquinolones, azithromycin). Drugs not listed above may have an, as yet, undetected potential to prolong the QT interval, updated information on QT prolonging agents is available at this website:QT prolonging agents   []  [x]  Patient received a dose of hydrochlorothiazide (Oretic) alone or in any combination  including triamterene (Dyazide, Maxzide) in the last 48 hours.   []  [x]  Patient received a medication known to increase dofetilide plasma concentrations prior to initial dofetilide dose:  . Trimethoprim (Primsol, Proloprim) in the last 36 hours . Verapamil (Calan, Verelan) in the last 36 hours or a sustained release dose in the last 72 hours . Megestrol (Megace) in the last 5 days  . Cimetidine (Tagamet) in the last 6 hours . Ketoconazole (Nizoral) in the last 24 hours . Itraconazole (Sporanox) in the last 48 hours  . Prochlorperazine (Compazine) in the last 36 hours    []  [x]  Patient is known to have a history of torsades de pointes; congenital or acquired long QT syndromes.   []  [x]  Patient has received a Class 1 antiarrhythmic with less than 2 half-lives since last dose. (Disopyramide, Quinidine, Procainamide, Lidocaine, Mexiletine, Flecainide, Propafenone)   []  [x]  Patient has received amiodarone therapy in the past 3 months or amiodarone level is greater than 0.3 ng/ml.    Patient has been appropriately anticoagulated with apixaban.  Ordering provider was confirmed at [email or web address] if they are not listed on the [place] Prescribers list.  Goal of Therapy: Follow renal function, electrolytes, potential drug interactions, and dose adjustment. Provide education and 1 week supply at discharge.  Plan:  [x]   Physician selected initial dose within range recommended for patients level of renal function - will monitor for response.  []   Physician selected initial dose outside of range recommended for patients level of renal function - will discuss if the dose should be altered  at this time.   Select One Calculated CrCl  Dose q12h  [x]  > 60 ml/min 500 mcg  []  40-60 ml/min 250 mcg  []  20-40 ml/min 125 mcg   2. Follow up QTc after the first 5 doses, renal function, electrolytes (K & Mg) daily x 3     days, dose adjustment, success of initiation and facilitate 1 week discharge  supply as     clinically indicated.  3. Initiate Tikosyn education video (Call [phone] and ask for Tikosyn Video # [phone]).  4. Place Enrollment Form on the chart for discharge supply of dofetilide.   Doylene Canard, PharmD Clinical Pharmacist  Pager: [phone] Phone: [phone]   5:29 PM 04/30/2018

## 2018-04-30 NOTE — H&P (Signed)
Primary Care Physician: [name], [name], DO Referring Physician: Dr. Horald Chestnut Heidi Stark is a 77 y.o. female with a h/o persistent afib, s/p ablation, previously on amiodarone , stopped many months ago, in the afib clinic for Tikosyn admit.  She was here one week ago but K was too low for admission at 3.3. K+ replaced and Kt several days later it was at 4.3. She states no  missed anticoagulation. Aware of price of drug.   Today, she denies symptoms of palpitations, chest pain, shortness of breath, orthopnea, PND, lower extremity edema, dizziness, presyncope, syncope, or neurologic sequela. The patient is tolerating medications without difficulties and is otherwise without complaint today.   Past Medical History:  Diagnosis Date  . Arthritis    "hands, wrists" (11/07/2016)  . CHF (congestive heart failure) ([place])   . Chronic lower back pain   . COPD (chronic obstructive pulmonary disease) ([place])   . DVT (deep venous thrombosis) ([place])   . History of blood transfusion 1990   "related to OR"  . History of kidney stones   . Hyperlipidemia   . Hypertension   . On home oxygen therapy    "2L w/CPAP when I sleep" (11/07/2016)  . OSA on CPAP   . PAF (paroxysmal atrial fibrillation) ([place])   . Pneumonia    "several times" (11/07/2016)  . Type II diabetes mellitus ([place])   . Visit for monitoring Tikosyn therapy 04/30/2018   Past Surgical History:  Procedure Laterality Date  . ATRIAL FIBRILLATION ABLATION N/A 11/07/2016   Procedure: Atrial Fibrillation Ablation;  Surgeon: [name], MD;  Location: [facility];  Service: Cardiovascular;  Laterality: N/A;  . BACK SURGERY    . CARDIAC CATHETERIZATION    . CARDIOVERSION N/A 08/11/2016   Procedure: CARDIOVERSION;  Surgeon: Larey Dresser, MD;  Location: [facility] ENDOSCOPY;  Service: Cardiovascular;  Laterality: N/A;  . CARDIOVERSION N/A 12/03/2017   Procedure: CARDIOVERSION;  Surgeon: Larey Dresser, MD;  Location: [facility]  ENDOSCOPY;  Service: Cardiovascular;  Laterality: N/A;  . CATARACT EXTRACTION W/ INTRAOCULAR LENS  IMPLANT, BILATERAL Bilateral   . COLON SURGERY  1990   vein graft and colon repair after nicked artery with back surgert  . COLONOSCOPY    . CYSTECTOMY     between bladder and kidneys  . GANGLION CYST EXCISION Left   . KNEE CARTILAGE SURGERY Right 1960s  . [place] SURGERY  1990  . REPAIR ILIAC ARTERY  1990   vein graft and colon repair after nicked artery with back surgert  . TEE WITHOUT CARDIOVERSION N/A 10/31/2013   Procedure: TRANSESOPHAGEAL ECHOCARDIOGRAM (TEE);  Surgeon: Larey Dresser, MD;  Location: [facility];  Service: Cardiovascular;  Laterality: N/A;  . TUBAL LIGATION      No current facility-administered medications for this encounter.     Allergies  Allergen Reactions  . Neomycin-Bacitracin Zn-Polymyx Rash  . Sulfonamide Derivatives Other (See Comments)    Stomach cramps  . Ciprofloxacin Hives, Itching and Rash  . Spironolactone Rash    Social History   Socioeconomic History  . Marital status: Married    Spouse name: Not on file  . Number of children: 2  . Years of education: Not on file  . Highest education level: Not on file  Occupational History  . Occupation: Retired  Scientific laboratory technician  . Financial resource strain: Not on file  . Food insecurity:    Worry: Not on file    Inability: Not  on file  . Transportation needs:    Medical: Not on file    Non-medical: Not on file  Tobacco Use  . Smoking status: Former Smoker    Packs/day: 1.00    Years: 45.00    Pack years: 45.00    Types: Cigarettes    Last attempt to quit: 10/15/2016    Years since quitting: 1.5  . Smokeless tobacco: Never Used  Substance and Sexual Activity  . Alcohol use: No    Alcohol/week: 0.0 standard drinks  . Drug use: No  . Sexual activity: Not on file  Lifestyle  . Physical activity:    Days per week: Not on file    Minutes per session: Not on file  . Stress: Not on file    Relationships  . Social connections:    Talks on phone: Not on file    Gets together: Not on file    Attends religious service: Not on file    Active member of club or organization: Not on file    Attends meetings of clubs or organizations: Not on file    Relationship status: Not on file  . Intimate partner violence:    Fear of current or ex partner: Not on file    Emotionally abused: Not on file    Physically abused: Not on file    Forced sexual activity: Not on file  Other Topics Concern  . Not on file  Social History Narrative  . Not on file    Family History  Problem Relation Age of Onset  . Diabetes Unknown   . Melanoma Unknown   . Factor V Leiden deficiency Daughter     ROS- All systems are reviewed and negative except as per the HPI above  Physical Exam: There were no vitals filed for this visit. Wt Readings from Last 3 Encounters:  04/30/18 121.6 kg  04/22/18 122.5 kg  04/05/18 122.2 kg    Labs: Lab Results  Component Value Date   NA 138 04/30/2018   K 3.9 04/30/2018   CL 103 04/30/2018   CO2 27 04/30/2018   GLUCOSE 121 (H) 04/30/2018   BUN 18 04/30/2018   CREATININE 0.89 04/30/2018   CALCIUM 8.7 (L) 04/30/2018   MG 2.1 04/30/2018   Lab Results  Component Value Date   INR 2.7 03/09/2014   Lab Results  Component Value Date   CHOL 139 04/01/2018   HDL 29 (L) 04/01/2018   LDLCALC 34 04/01/2018   TRIG 380 (H) 04/01/2018     GEN- The patient is well appearing, alert and oriented x 3 today.   Head- normocephalic, atraumatic Eyes-  Sclera clear, conjunctiva pink Ears- hearing intact Oropharynx- clear Neck- supple, no JVP Lymph- no cervical lymphadenopathy Lungs- Clear to ausculation bilaterally, normal work of breathing Heart-irregular rate and rhythm, no murmurs, rubs or gallops, PMI not laterally displaced GI- soft, NT, ND, + BS Extremities- no clubbing, cyanosis, or edema MS- no significant deformity or atrophy Skin- no rash or  lesion Psych- euthymic mood, full affect Neuro- strength and sensation are intact  EKG-ectopic atrial tachycardia vrs atrial  alutter    Assessment and Plan: 1. Persistent afib Pt has not missed any doses of eliquis No benadryl  use Is aware if the price of drug and will be able to get thru pt assistance Bmet/mag pending qtc 469 ms, last week 428 ms Labs last week did show a K+ of 3.3, K+ last Friday at 4.3 Current kt supplementation at 30  meq bid PharmD reviewed drugs and is not on any qtc prolonging drugs  Bmet/K pending for today's admission    Geroge Baseman. [place], [facility] [address] [place], [place] [address] [phone]  I have seen, examined the patient, and reviewed the above assessment and plan.  Changes to above are made where necessary.  On exam, iRRR.  Currently presents with atrial flutter.  She has h/o persistent afib, morbid obesity, and OSA.  Reports compliance with eliquis without interruption.  Will admit at this time for tikosyn initation.  EP to follow closely while here.  Co Sign: Thompson Grayer, MD 04/30/2018 5:05 PM

## 2018-04-30 NOTE — Progress Notes (Signed)
K level at 3.9 amd Magnesium level at 2.1. Dr Rayann Heman aware. With order to give Tikosyn at k level 3.9.

## 2018-04-30 NOTE — Progress Notes (Signed)
Engineer, maintenance (IT) text paged on new pt admit for Tikosyn Loading. Heidi Stark, text paged for new admit.

## 2018-04-30 NOTE — Progress Notes (Signed)
Upon patient's 11pm EKG, 3 hours post first Tikosyn dose, pt's QTc was 503.

## 2018-04-30 NOTE — Progress Notes (Signed)
Primary Care Physician: [name], [name], DO Referring Physician: Dr. Horald Chestnut Heidi Stark is a 77 y.o. female with a h/o persistent afib, s/p ablation, previously on amiodarone , stopped many months ago, in the afib clinic for Tikosyn admit.  She was here one week ago but Kt was too low for admission at 3.3. K+ replaced and Kt several days later it was at 4.3. She states no  missed anticoagulation. Aware of price of drug.   Today, she denies symptoms of palpitations, chest pain, shortness of breath, orthopnea, PND, lower extremity edema, dizziness, presyncope, syncope, or neurologic sequela. The patient is tolerating medications without difficulties and is otherwise without complaint today.   Past Medical History:  Diagnosis Date  . Arthritis    "hands, wrists" (11/07/2016)  . CHF (congestive heart failure) ([place])   . Chronic lower back pain   . COPD (chronic obstructive pulmonary disease) ([place])   . DVT (deep venous thrombosis) ([place])   . History of blood transfusion 1990   "related to OR"  . History of kidney stones   . Hyperlipidemia   . Hypertension   . On home oxygen therapy    "2L w/CPAP when I sleep" (11/07/2016)  . OSA on CPAP   . PAF (paroxysmal atrial fibrillation) ([place])   . Pneumonia    "several times" (11/07/2016)  . Type II diabetes mellitus ([place])    Past Surgical History:  Procedure Laterality Date  . ATRIAL FIBRILLATION ABLATION N/A 11/07/2016   Procedure: Atrial Fibrillation Ablation;  Surgeon: [name], MD;  Location: [facility];  Service: Cardiovascular;  Laterality: N/A;  . BACK SURGERY    . CARDIAC CATHETERIZATION    . CARDIOVERSION N/A 08/11/2016   Procedure: CARDIOVERSION;  Surgeon: Larey Dresser, MD;  Location: [facility] ENDOSCOPY;  Service: Cardiovascular;  Laterality: N/A;  . CARDIOVERSION N/A 12/03/2017   Procedure: CARDIOVERSION;  Surgeon: Larey Dresser, MD;  Location: [facility] ENDOSCOPY;  Service: Cardiovascular;  Laterality: N/A;  .  CATARACT EXTRACTION W/ INTRAOCULAR LENS  IMPLANT, BILATERAL Bilateral   . COLON SURGERY  1990   vein graft and colon repair after nicked artery with back surgert  . COLONOSCOPY    . CYSTECTOMY     between bladder and kidneys  . GANGLION CYST EXCISION Left   . KNEE CARTILAGE SURGERY Right 1960s  . [place] SURGERY  1990  . REPAIR ILIAC ARTERY  1990   vein graft and colon repair after nicked artery with back surgert  . TEE WITHOUT CARDIOVERSION N/A 10/31/2013   Procedure: TRANSESOPHAGEAL ECHOCARDIOGRAM (TEE);  Surgeon: Larey Dresser, MD;  Location: [facility];  Service: Cardiovascular;  Laterality: N/A;  . TUBAL LIGATION      Current Outpatient Medications  Medication Sig Dispense Refill  . acetaminophen (TYLENOL) 650 MG CR tablet Take 650 mg by mouth every 8 (eight) hours as needed for pain.    Marland Kitchen ELIQUIS 5 MG TABS tablet TAKE 1 TABLET BY MOUTH TWICE A DAY (Patient taking differently: Take 5 mg by mouth 2 (two) times daily. ) 180 tablet 1  . eplerenone (INSPRA) 25 MG tablet TAKE 1 TABLET BY MOUTH EVERY DAY (Patient taking differently: Take 25 mg by mouth daily. ) 90 tablet 3  . fenofibrate 160 MG tablet TAKE 1 TABLET BY MOUTH DAILY *NEED OFFICE VISIT FOR REFILLS* 30 tablet 0  . furosemide (LASIX) 40 MG tablet Take 1.5 tablets (60 mg total) by mouth 2 (two) times daily. [place]  tablet 3  . glimepiride (AMARYL) 2 MG tablet Take 2-4 mg by mouth See admin instructions. Take 2 tablets (4 mg) by mouth every morning and take 1 tablet (2 mg) by mouth every evening.    . insulin glargine (LANTUS) 100 UNIT/ML injection Inject 10 Units into the skin at bedtime.    . metFORMIN (GLUCOPHAGE) 1000 MG tablet TAKE 1 TABLET BY MOUTH TWICE A DAY WITH MEALS. NEED OFFICE VISIT FOR REFILLS 60 tablet 0  . metoprolol succinate (TOPROL-XL) 25 MG 24 hr tablet TAKE 1 TABLET (25 MG TOTAL) BY MOUTH 2 (TWO) TIMES DAILY. 60 tablet 6  . potassium chloride (KLOR-CON M10) 10 MEQ tablet Take 3 tablets (30 mEq total) by  mouth 2 (two) times daily. 180 tablet 3  . rosuvastatin (CRESTOR) 10 MG tablet Take 10 mg by mouth daily.    . sacubitril-valsartan (ENTRESTO) 49-51 MG Take 1 tablet by mouth 2 (two) times daily. 60 tablet 11   No current facility-administered medications for this encounter.     Allergies  Allergen Reactions  . Neomycin-Bacitracin Zn-Polymyx Rash  . Sulfonamide Derivatives Other (See Comments)    Stomach cramps  . Ciprofloxacin Hives, Itching and Rash  . Spironolactone Rash    Social History   Socioeconomic History  . Marital status: Married    Spouse name: Not on file  . Number of children: 2  . Years of education: Not on file  . Highest education level: Not on file  Occupational History  . Occupation: Retired  Scientific laboratory technician  . Financial resource strain: Not on file  . Food insecurity:    Worry: Not on file    Inability: Not on file  . Transportation needs:    Medical: Not on file    Non-medical: Not on file  Tobacco Use  . Smoking status: Former Smoker    Packs/day: 1.00    Years: 45.00    Pack years: 45.00    Types: Cigarettes    Last attempt to quit: 10/15/2016    Years since quitting: 1.5  . Smokeless tobacco: Never Used  Substance and Sexual Activity  . Alcohol use: No    Alcohol/week: 0.0 standard drinks  . Drug use: No  . Sexual activity: Not on file  Lifestyle  . Physical activity:    Days per week: Not on file    Minutes per session: Not on file  . Stress: Not on file  Relationships  . Social connections:    Talks on phone: Not on file    Gets together: Not on file    Attends religious service: Not on file    Active member of club or organization: Not on file    Attends meetings of clubs or organizations: Not on file    Relationship status: Not on file  . Intimate partner violence:    Fear of current or ex partner: Not on file    Emotionally abused: Not on file    Physically abused: Not on file    Forced sexual activity: Not on file  Other  Topics Concern  . Not on file  Social History Narrative  . Not on file    Family History  Problem Relation Age of Onset  . Diabetes Unknown   . Melanoma Unknown   . Factor V Leiden deficiency Daughter     ROS- All systems are reviewed and negative except as per the HPI above  Physical Exam: Vitals:   04/30/18 0949  BP: 104/64  Pulse: Marland Kitchen)  107  Weight: 121.6 kg  Height: 5\' 6"  (1.676 m)   Wt Readings from Last 3 Encounters:  04/30/18 121.6 kg  04/22/18 122.5 kg  04/05/18 122.2 kg    Labs: Lab Results  Component Value Date   NA 138 04/26/2018   K 4.2 04/26/2018   CL 101 04/26/2018   CO2 29 04/26/2018   GLUCOSE 142 (H) 04/26/2018   BUN 15 04/26/2018   CREATININE 0.77 04/26/2018   CALCIUM 8.8 (L) 04/26/2018   MG 2.1 04/22/2018   Lab Results  Component Value Date   INR 2.7 03/09/2014   Lab Results  Component Value Date   CHOL 139 04/01/2018   HDL 29 (L) 04/01/2018   LDLCALC 34 04/01/2018   TRIG 380 (H) 04/01/2018     GEN- The patient is well appearing, alert and oriented x 3 today.   Head- normocephalic, atraumatic Eyes-  Sclera clear, conjunctiva pink Ears- hearing intact Oropharynx- clear Neck- supple, no JVP Lymph- no cervical lymphadenopathy Lungs- Clear to ausculation bilaterally, normal work of breathing Heart-irregular rate and rhythm, no murmurs, rubs or gallops, PMI not laterally displaced GI- soft, NT, ND, + BS Extremities- no clubbing, cyanosis, or edema MS- no significant deformity or atrophy Skin- no rash or lesion Psych- euthymic mood, full affect Neuro- strength and sensation are intact  EKG-ectopic atrial tachycardia vrs atrial  alutter    Assessment and Plan: 1. Persistent afib Pt has not missed any doses of eliquis No benadryl  use Is aware if the price of drug and will be able to get thru pt assistance Bmet/mag pending qtc 469 ms, last week 428 ms Labs last week did show a K+ of 3.3, K+ last Friday at 4.3 Current kt  supplementation at 30 meq bid PharmD reviewed drugs and is not on any qtc prolonging drugs  Bmet/K pending for today's admission    Butch Penny C. Pleasant Britz, [facility] [address] [place], [place] [address] [phone]

## 2018-05-01 ENCOUNTER — Encounter (HOSPITAL_COMMUNITY): Payer: Self-pay | Admitting: Nurse Practitioner

## 2018-05-01 ENCOUNTER — Encounter (HOSPITAL_COMMUNITY): Payer: Medicare Other

## 2018-05-01 LAB — BASIC METABOLIC PANEL
ANION GAP: 7 (ref 5–15)
BUN: 16 mg/dL (ref 8–23)
CO2: 30 mmol/L (ref 22–32)
Calcium: 8.7 mg/dL — ABNORMAL LOW (ref 8.9–10.3)
Chloride: 102 mmol/L (ref 98–111)
Creatinine, Ser: 0.82 mg/dL (ref 0.44–1.00)
GFR calc Af Amer: >60 mL/min (ref >60)
GLUCOSE: 146 mg/dL — AB (ref 70–99)
POTASSIUM: 4.2 mmol/L (ref 3.5–5.1)
Sodium: 139 mmol/L (ref 135–145)

## 2018-05-01 LAB — GLUCOSE, CAPILLARY
GLUCOSE-CAPILLARY: 291 mg/dL — AB (ref 70–99)
GLUCOSE-CAPILLARY: 196 mg/dL — AB (ref 70–99)
Glucose-Capillary: 135 mg/dL — ABNORMAL HIGH (ref 70–99)
Glucose-Capillary: 182 mg/dL — ABNORMAL HIGH (ref 70–99)

## 2018-05-01 LAB — MAGNESIUM: MAGNESIUM: 2.2 mg/dL (ref 1.7–2.4)

## 2018-05-01 MED ORDER — FENOFIBRATE 160 MG PO TABS
160.0000 mg | ORAL_TABLET | Freq: Every day | ORAL | Status: DC
  Administered 2018-05-01 – 2018-05-03 (×3): 160 mg via ORAL
  Filled 2018-05-01 (×2): qty 1

## 2018-05-01 NOTE — Progress Notes (Addendum)
Electrophysiology Rounding Note  Patient Name: Heidi Stark Date of Encounter: 05/01/2018  Primary Cardiologist: [name] Electrophysiologist: [name]   Subjective   The patient is doing well today.  At this time, the patient denies chest pain, shortness of breath, or any new concerns.  Inpatient Medications    Scheduled Meds: . apixaban  5 mg Oral BID  . dofetilide  500 mcg Oral BID  . eplerenone  25 mg Oral Daily  . fenofibrate  160 mg Oral Daily  . furosemide  60 mg Oral BID  . glimepiride  2 mg Oral Q supper  . glimepiride  4 mg Oral Q breakfast  . insulin aspart  0-9 Units Subcutaneous TID WC  . insulin glargine  10 Units Subcutaneous QHS  . metFORMIN  1,000 mg Oral BID WC  . metoprolol succinate  25 mg Oral BID  . potassium chloride  30 mEq Oral BID  . rosuvastatin  10 mg Oral Daily  . sacubitril-valsartan  1 tablet Oral BID  . sodium chloride flush  3 mL Intravenous Q12H   Continuous Infusions: . sodium chloride     PRN Meds: sodium chloride, sodium chloride flush   Vital Signs    Vitals:   04/30/18 2334 05/01/18 0405 05/01/18 0426 05/01/18 0902  BP: 96/63 (!) 85/43 106/86   Pulse: 92 (!) 59  (!) 105  Resp: 18     Temp: 97.6 F (36.4 C) 98.5 F (36.9 C)    TempSrc: Oral     SpO2: 98% 99%    Weight:  122.4 kg    Height:        Intake/Output Summary (Last 24 hours) at 05/01/2018 0952 Last data filed at 05/01/2018 0918 Gross per 24 hour  Intake 120 ml  Output -  Net 120 ml   Filed Weights   04/30/18 1700 05/01/18 0405  Weight: 122.4 kg 122.4 kg    Physical Exam    GEN- The patient is well appearing, alert and oriented x 3 today.   Head- normocephalic, atraumatic Eyes-  Sclera clear, conjunctiva pink Ears- hearing intact Oropharynx- clear Neck- supple Lungs- Clear to ausculation bilaterally, normal work of breathing Heart- Irregular rate and rhythm  GI- soft, NT, ND, + BS Extremities- no clubbing, cyanosis, or edema Skin- no rash or  lesion Psych- euthymic mood, full affect Neuro- strength and sensation are intact  Labs    CBC No results for input(s): WBC, NEUTROABS, HGB, HCT, MCV, PLT in the last 72 hours. Basic Metabolic Panel Recent Labs    04/30/18 0931 05/01/18 0348  NA 138 139  K 3.9 4.2  CL 103 102  CO2 27 30  GLUCOSE 121* 146*  BUN 18 16  CREATININE 0.89 0.82  CALCIUM 8.7* 8.7*  MG 2.1 2.2    Telemetry    Atypical atrial flutter (personally reviewed)  Radiology    No results found.   Patient Profile     Heidi Stark is a 77 y.o. female admitted for Tikosyn load  Assessment & Plan    1.  Persistent atrial fibrillation/atrial flutter Admitted for Tikosyn load QTc, BMET, Mg stable Continue Eliquis for CHADS2VASC of 5 Plan for DCCV tomorrow if still in flutter NPO after midnight tonight   2.  Chronic diastolic heart failure Euvolemic on exam Continue current therapy   For questions or updates, please contact [place] Please consult [email or web address] for contact info under Cardiology/STEMI.  Signed, Chanetta Marshall, NP  05/01/2018, 9:52 AM  I have seen, examined the patient, and reviewed the above assessment and plan.  Changes to above are made where necessary.  On exam, iRRR.  Continue tikosyn load.  Anticipate cardioversion tomorrow if still in AF.  Co Sign: Thompson Grayer, MD 05/01/2018 5:07 PM

## 2018-05-01 NOTE — Progress Notes (Signed)
Inpatient Diabetes Program Recommendations  AACE/ADA: New Consensus Statement on Inpatient Glycemic Control (2015)  Target Ranges:  Prepandial:   less than 140 mg/dL      Peak postprandial:   less than 180 mg/dL (1-2 hours)      Critically ill patients:  140 - 180 mg/dL   Lab Results  Component Value Date   GLUCAP 182 (H) 05/01/2018   HGBA1C 7.7 (H) 10/11/2015    DM2  Home DM meds: Amaryl 4mg  am/ 2mg  pm                              Metformin 1000 mg BID                              Lantus 10 units hs  Current inpatient DM meds: Amaryl 4mg  am/ 2mg  pm                                               Metformin 1000 mg BID                                               Lantus 10 units hs                                               Novolog (0-9 units) tid ac                               MD please consider the following inpatient DM recs:  1. Add Novolog correction scale (0-5 units) hs  2. Order Hgb A1c. Last one documented was 7.7 in 2017.  3. Not recommended to use oral hypoglycemic agents in the acute care setting.   Thank you.  -- Will follow during hospitalization.--  Jonna Clark RN, MSN Diabetes Coordinator Inpatient Glycemic Control Team Team Pager: [phone] (8am-5pm)

## 2018-05-01 NOTE — Care Management (Signed)
#    4.  S/W [facility]   @ [facility] # [id number]  1. TIKOSYN  [place] $ 33.00 TIER- 3 DRUG PRIOR APPROVAL-NO  2. TIKOSYN 250 MCG BID COVER- YES CO-PAY- $ 33.00 TIER- 3 DRUG PRIOR APPROVAL- NO  3. TIKOSYN 500 MCG BID COVER- YES CO-PAY- $ 33.00 TIER- 3 DRUG PRIOR APPROVAL- NO  4. DOFETILIDE   125 MCG BID COVER- YES CO-PAY- $ 18.39 TIER- 1 DRUG PRIOR APPROVAL- NO  5. DOFETILIDE  250 MCG BID COVER- YES CO-PAY- $ 18.39 TIER- 1 DRUG PRIOR APPROVAL- NO  6. DOFETILIDE   500 MCG BID COVER- YES CO-PAY- $ 18.39 TIER- 1 DRUG PRIOR APPROVAL- NO  PREFERRED PHARMACY : YES [facility] M/O 90 DAY SUPPLY FOR M/O $ 88.36 RETAIL $ 96.00 ( TIKOSYN ) 90 DAY SUPPLY FOR M/O AND RETAIL $ 55.00 ( DOFETILIDE )

## 2018-05-01 NOTE — Discharge Instructions (Addendum)
You have an appointment set up with the Atrial Fibrillation Clinic.  Multiple studies have shown that being followed by a dedicated atrial fibrillation clinic in addition to the standard care you receive from your other physicians improves health. We believe that enrollment in the atrial fibrillation clinic will allow us to better care for you.  ° °The phone number to the Atrial Fibrillation Clinic is [phone]. The clinic is staffed Monday through Friday from 8:30am to 5pm. ° °Parking Directions: The clinic is located in the Heart and Vascular Building connected to [facility]. °1)From [address] turn on to [address] and go to the 3rd entrance  (Heart and Vascular entrance) on the right. °2)Look to the right for Heart &Vascular Parking Garage. °3)A code for the entrance is required please call the clinic to receive this.   °4)Take the elevators to the 1st floor. Registration is in the room with the glass walls at the end of the hallway. ° °If you have any trouble parking or locating the clinic, please don’t hesitate to call [phone]. ° ° °Information on my medicine - ELIQUIS® (apixaban) ° °This medication education was reviewed with me or my healthcare representative as part of my discharge preparation.  ° °Why was Eliquis® prescribed for you? °Eliquis® was prescribed for you to reduce the risk of forming blood clots that can cause a stroke if you have a medical condition called atrial fibrillation (a type of irregular heartbeat) OR to reduce the risk of a blood clots forming after orthopedic surgery. ° °What do You need to know about Eliquis® ? °Take your Eliquis® TWICE DAILY - one tablet in the morning and one tablet in the evening with or without food.  It would be best to take the doses about the same time each day. ° °If you have difficulty swallowing the tablet whole please discuss with your pharmacist how to take the medication safely. ° °Take Eliquis® exactly as prescribed by your  doctor and DO NOT stop taking Eliquis® without talking to the doctor who prescribed the medication.  Stopping may increase your risk of developing a new clot or stroke.  Refill your prescription before you run out. ° °After discharge, you should have regular check-up appointments with your healthcare provider that is prescribing your Eliquis®.  In the future your dose may need to be changed if your kidney function or weight changes by a significant amount or as you get older. ° °What do you do if you miss a dose? °If you miss a dose, take it as soon as you remember on the same day and resume taking twice daily.  Do not take more than one dose of ELIQUIS at the same time. ° °Important Safety Information °A possible side effect of Eliquis® is bleeding. You should call your healthcare provider right away if you experience any of the following: °? Bleeding from an injury or your nose that does not stop. °? Unusual colored urine (red or dark brown) or unusual colored stools (red or black). °? Unusual bruising for unknown reasons. °? A serious fall or if you hit your head (even if there is no bleeding). ° °Some medicines may interact with Eliquis® and might increase your risk of bleeding or clotting while on Eliquis®. To help avoid this, consult your healthcare provider or pharmacist prior to using any new prescription or non-prescription medications, including herbals, vitamins, non-steroidal anti-inflammatory drugs (NSAIDs) and supplements. ° °This website has more information on Eliquis® (apixaban): www.Eliquis.com. ° °  °

## 2018-05-02 ENCOUNTER — Inpatient Hospital Stay (HOSPITAL_COMMUNITY): Payer: Medicare Other | Admitting: Certified Registered"

## 2018-05-02 ENCOUNTER — Encounter (HOSPITAL_COMMUNITY)
Admission: AD | Disposition: A | Discharge status: Home / Self Care | Payer: Self-pay | Source: Ambulatory Visit | Attending: Internal Medicine

## 2018-05-02 ENCOUNTER — Encounter (HOSPITAL_COMMUNITY): Payer: Self-pay | Admitting: *Deleted

## 2018-05-02 HISTORY — PX: CARDIOVERSION: SHX1299

## 2018-05-02 LAB — BASIC METABOLIC PANEL
Anion gap: 8 (ref 5–15)
BUN: 11 mg/dL (ref 8–23)
CHLORIDE: 103 mmol/L (ref 98–111)
CO2: 27 mmol/L (ref 22–32)
CREATININE: 0.69 mg/dL (ref 0.44–1.00)
Calcium: 8.2 mg/dL — ABNORMAL LOW (ref 8.9–10.3)
GFR calc Af Amer: >60 mL/min (ref >60)
GFR calc non Af Amer: >60 mL/min (ref >60)
Glucose, Bld: 145 mg/dL — ABNORMAL HIGH (ref 70–99)
POTASSIUM: 3.7 mmol/L (ref 3.5–5.1)
SODIUM: 138 mmol/L (ref 135–145)

## 2018-05-02 LAB — GLUCOSE, CAPILLARY
GLUCOSE-CAPILLARY: 131 mg/dL — AB (ref 70–99)
GLUCOSE-CAPILLARY: 211 mg/dL — AB (ref 70–99)
GLUCOSE-CAPILLARY: 141 mg/dL — AB (ref 70–99)
Glucose-Capillary: 161 mg/dL — ABNORMAL HIGH (ref 70–99)

## 2018-05-02 LAB — MAGNESIUM: Magnesium: 1.9 mg/dL (ref 1.7–2.4)

## 2018-05-02 SURGERY — CARDIOVERSION
Anesthesia: General

## 2018-05-02 MED ORDER — LIDOCAINE HCL (CARDIAC) PF 100 MG/5ML IV SOSY
PREFILLED_SYRINGE | INTRAVENOUS | Status: DC | PRN
  Administered 2018-05-02: 50 mg via INTRATRACHEAL

## 2018-05-02 MED ORDER — PROPOFOL 10 MG/ML IV BOLUS
INTRAVENOUS | Status: DC | PRN
  Administered 2018-05-02: 100 mg via INTRAVENOUS

## 2018-05-02 MED ORDER — DOFETILIDE 250 MCG PO CAPS
250.0000 ug | ORAL_CAPSULE | Freq: Two times a day (BID) | ORAL | Status: DC
  Administered 2018-05-02 (×2): 250 ug via ORAL
  Filled 2018-05-02 (×2): qty 1

## 2018-05-02 NOTE — Progress Notes (Signed)
QTc a little long after cardioversion. Discussed with Dr [name], will decrease dose to 288mcg twice daily.  Chanetta Marshall, NP 05/02/2018 11:55 AM

## 2018-05-02 NOTE — Transfer of Care (Signed)
Immediate Anesthesia Transfer of Care Note  Patient: Heidi Stark  Procedure(s) Performed: CARDIOVERSION (N/A )  Patient Location: Endoscopy Unit  Anesthesia Type:MAC  Level of Consciousness: awake, alert  and oriented  Airway & Oxygen Therapy: Patient connected to nasal cannula oxygen  Post-op Assessment: Post -op Vital signs reviewed and stable  Post vital signs: stable  Last Vitals:  Vitals Value Taken Time  BP    Temp    Pulse    Resp    SpO2      Last Pain:  Vitals:   05/02/18 0951  TempSrc: Oral  PainSc: 0-No pain      Patients Stated Pain Goal: 0 (10/04/20 4825)  Complications: No apparent anesthesia complications

## 2018-05-02 NOTE — Anesthesia Postprocedure Evaluation (Signed)
Anesthesia Post Note  Patient: Heidi Stark  Procedure(s) Performed: CARDIOVERSION (N/A )     Patient location during evaluation: Endoscopy Anesthesia Type: General Level of consciousness: awake and alert Pain management: pain level controlled Vital Signs Assessment: post-procedure vital signs reviewed and stable Respiratory status: spontaneous breathing, nonlabored ventilation, respiratory function stable and patient connected to nasal cannula oxygen Cardiovascular status: blood pressure returned to baseline and stable Postop Assessment: no apparent nausea or vomiting Anesthetic complications: no    Last Vitals:  Vitals:   05/02/18 1135 05/02/18 1140  BP: 101/82 101/82  Pulse: (!) 57 68  Resp: (!) 21 17  Temp:    SpO2: 95% 93%    Last Pain:  Vitals:   05/02/18 1140  TempSrc:   PainSc: 0-No pain                 Ryan P Ellender

## 2018-05-02 NOTE — Care Management Note (Addendum)
Case Management Note  Patient Details  Name: Heidi Stark MRN: [id number] Date of Birth: 29-Sep-1940  Subjective/Objective:   Pt presented for Tikosyn. Persistent Atrial Fib. PTA Independent from home.  Pt is agreeable for a  1 week supply of Tikosyn via [facility] to deliver the medication to the bedside. MD please escribe Rx for 7 day supply to [facility] and the original Rx with refills.                                     Action/Plan: Per pt the [facility] will submit the patient assistance application to the company. No further home needs identified at this time.   Expected Discharge Date:                  Expected Discharge Plan:  Home/Self Care  In-House Referral:  NA  Discharge planning Services  CM Consult, Medication Assistance  Post Acute Care Choice:  NA Choice offered to:  NA  DME Arranged:  N/A DME Agency:  NA  HH Arranged:  NA HH Agency:  NA  Status of Service:  Completed, signed off  If discussed at [place] of Stay Meetings, dates discussed:    Additional Comments: 1153 05-03-18 [name], RN,BSN [phone] [facility]. MD please write Rx for 7 day supply and it will be sent to [place] and pt will need an additional Rx with refills for Tikosyn. [name], RN 05/02/2018, 3:04 PM

## 2018-05-02 NOTE — H&P (Signed)
   INTERVAL PROCEDURE H&P  History and Physical Interval Note:  05/02/2018 10:32 AM  Heidi Stark has presented today for their planned procedure. The various methods of treatment have been discussed with the patient and family. After consideration of risks, benefits and other options for treatment, the patient has consented to the procedure.  The patients' outpatient history has been reviewed, patient examined, and no change in status from most recent office note within the past 30 days. I have reviewed the patients' chart and labs and will proceed as planned. Questions were answered to the patient's satisfaction.   Heidi Casino, MD, [facility], [place] Director of the Advanced Lipid Disorders &  Cardiovascular Risk Reduction Clinic Diplomate of the American Board of Clinical Lipidology Attending Cardiologist  Direct Dial: [phone]  Fax: [phone]  Website:  [email or web address] 05/02/2018, 10:32 AM

## 2018-05-02 NOTE — Anesthesia Preprocedure Evaluation (Addendum)
Anesthesia Evaluation  Patient identified by MRN, date of birth, ID band Patient awake    Reviewed: Allergy & Precautions, NPO status , Patient's Chart, lab work & pertinent test results  Airway Mallampati: II  TM Distance: >3 FB Neck ROM: Full    Dental  (+) Upper Dentures, Lower Dentures   Pulmonary sleep apnea and Continuous Positive Airway Pressure Ventilation , COPD, former smoker,    Pulmonary exam normal breath sounds clear to auscultation       Cardiovascular hypertension, Pt. on home beta blockers + Peripheral Vascular Disease, +CHF and + DVT  + dysrhythmias Atrial Fibrillation  Rhythm:Irregular Rate:Normal  ECHO: Normal LV size with mild LV hypertrophy. EF 55%. Moderate diastolic dysfunction. Normal RV size and systolic function. No significant valvular abnormalities. Biatrial enlargement.   Neuro/Psych negative neurological ROS  negative psych ROS   GI/Hepatic negative GI ROS, Neg liver ROS,   Endo/Other  diabetes, Oral Hypoglycemic Agents, Insulin DependentMorbid obesity  Renal/GU negative Renal ROS     Musculoskeletal Chronic lower back pain   Abdominal (+) + obese,   Peds  Hematology HLD   Anesthesia Other Findings a fib tikosyn admit  Reproductive/Obstetrics                            Anesthesia Physical Anesthesia Plan  ASA: III  Anesthesia Plan: General   Post-op Pain Management:    Induction: Intravenous  PONV Risk Score and Plan: 3 and Propofol infusion and Treatment may vary due to age or medical condition  Airway Management Planned: Mask  Additional Equipment:   Intra-op Plan:   Post-operative Plan:   Informed Consent: I have reviewed the patients History and Physical, chart, labs and discussed the procedure including the risks, benefits and alternatives for the proposed anesthesia with the patient or authorized representative who has indicated his/her  understanding and acceptance.   Dental advisory given  Plan Discussed with: CRNA  Anesthesia Plan Comments:         Anesthesia Quick Evaluation

## 2018-05-02 NOTE — Anesthesia Procedure Notes (Signed)
Procedure Name: MAC Date/Time: 05/02/2018 11:25 AM Performed by: Lavell Luster, CRNA Pre-anesthesia Checklist: Patient identified, Emergency Drugs available, Suction available, Patient being monitored and Timeout performed Oxygen Delivery Method: Nasal cannula Induction Type: IV induction Placement Confirmation: positive ETCO2 Dental Injury: Teeth and Oropharynx as per pre-operative assessment

## 2018-05-02 NOTE — Plan of Care (Signed)
  Problem: Education: Goal: Knowledge of General Education information will improve Description Including pain rating scale, medication(s)/side effects and non-pharmacologic comfort measures Outcome: Progressing   Problem: Health Behavior/Discharge Planning: Goal: Ability to manage health-related needs will improve Outcome: Progressing   Problem: Clinical Measurements: Goal: Ability to maintain clinical measurements within normal limits will improve Outcome: Progressing Goal: Will remain free from infection Outcome: Progressing Goal: Diagnostic test results will improve Outcome: Progressing Goal: Respiratory complications will improve Outcome: Progressing Goal: Cardiovascular complication will be avoided Outcome: Progressing   Problem: Activity: Goal: Risk for activity intolerance will decrease Outcome: Progressing   Problem: Nutrition: Goal: Adequate nutrition will be maintained Outcome: Progressing   Problem: Coping: Goal: Level of anxiety will decrease Outcome: Progressing   Problem: Elimination: Goal: Will not experience complications related to bowel motility Outcome: Progressing Goal: Will not experience complications related to urinary retention Outcome: Progressing   Problem: Skin Integrity: Goal: Risk for impaired skin integrity will decrease Outcome: Progressing   Problem: Education: Goal: Knowledge of disease or condition will improve Outcome: Progressing Goal: Understanding of medication regimen will improve Outcome: Progressing Goal: Individualized Educational Video(s) Outcome: Progressing   Problem: Activity: Goal: Ability to tolerate increased activity will improve Outcome: Progressing   Problem: Health Behavior/Discharge Planning: Goal: Ability to safely manage health-related needs after discharge will improve Outcome: Progressing

## 2018-05-02 NOTE — Progress Notes (Addendum)
Electrophysiology Rounding Note  Patient Name: Heidi Stark Date of Encounter: 05/02/2018  Primary Cardiologist: [name] Electrophysiologist: [name]   Subjective   The patient is doing well today.  At this time, the patient denies chest pain, shortness of breath, or any new concerns.  Inpatient Medications    Scheduled Meds: . apixaban  5 mg Oral BID  . dofetilide  500 mcg Oral BID  . eplerenone  25 mg Oral Daily  . fenofibrate  160 mg Oral q1800  . furosemide  60 mg Oral BID  . glimepiride  2 mg Oral Q supper  . glimepiride  4 mg Oral Q breakfast  . insulin aspart  0-9 Units Subcutaneous TID WC  . insulin glargine  10 Units Subcutaneous QHS  . metFORMIN  1,000 mg Oral BID WC  . metoprolol succinate  25 mg Oral BID  . potassium chloride  30 mEq Oral BID  . rosuvastatin  10 mg Oral Daily  . sacubitril-valsartan  1 tablet Oral BID  . sodium chloride flush  3 mL Intravenous Q12H   Continuous Infusions: . sodium chloride     PRN Meds: sodium chloride, sodium chloride flush   Vital Signs    Vitals:   05/01/18 2120 05/01/18 2207 05/02/18 0508 05/02/18 0510  BP: 116/62   106/64  Pulse: 73 82  94  Resp:    19  Temp: 98 F (36.7 C)  97.8 F (36.6 C)   TempSrc: Oral  Oral   SpO2: 92%   98%  Weight:    122 kg  Height:        Intake/Output Summary (Last 24 hours) at 05/02/2018 0840 Last data filed at 05/01/2018 1442 Gross per 24 hour  Intake 360 ml  Output 3 ml  Net 357 ml   Filed Weights   04/30/18 1700 05/01/18 0405 05/02/18 0510  Weight: 122.4 kg 122.4 kg 122 kg    Physical Exam    GEN- The patient is elderly and obese appearing, alert and oriented x 3 today.   Head- normocephalic, atraumatic Eyes-  Sclera clear, conjunctiva pink Ears- hearing intact Oropharynx- clear Neck- supple Lungs- Clear to ausculation bilaterally, normal work of breathing Heart- Irregular rate and rhythm  GI- soft, NT, ND, + BS Extremities- no clubbing, cyanosis, or  edema Skin- no rash or lesion Psych- euthymic mood, full affect Neuro- strength and sensation are intact  Labs    Basic Metabolic Panel Recent Labs    05/01/18 0348 05/02/18 0618  NA 139 138  K 4.2 3.7  CL 102 103  CO2 30 27  GLUCOSE 146* 145*  BUN 16 11  CREATININE 0.82 0.69  CALCIUM 8.7* 8.2*  MG 2.2 1.9     Telemetry    Atypical atrial flutter (personally reviewed)  Radiology    No results found.   Patient Profile     Heidi Stark is a 77 y.o. female admitted for Tikosyn load  Assessment & Plan    1. Persistent atrial fibrillation/atrial flutter Admitted for Tikosyn load Mg stable QTc a little long, Acel Natzke reassess in SR.  If QTc still long Kaston Faughn decrease dose. Ellwood Steidle replace K+ Continue Eliquis for CHADS2VASC of 5 Plan DCCV today  2.  Chronic diastolic heart failure Euvolemic on exam Continue current therapy  3.  OSA Compliant with CPAP  For questions or updates, please contact [place] Please consult [email or web address] for contact info under Cardiology/STEMI.  Signed, Chanetta Marshall, NP  05/02/2018, 8:40 AM  I have seen and examined this patient with Chanetta Marshall.  Agree with above, note added to reflect my findings.  On exam, iRRR, no murmurs, lungs clear. QTc prolonged today. Plan for cardioversion. If QTc prolonged after cardioversion Ezabella Teska plan for reduced tikosyn dosing.    Kaytie Ratcliffe M. Dajah Fischman MD 05/02/2018 11:11 AM

## 2018-05-02 NOTE — CV Procedure (Signed)
   CARDIOVERSION NOTE  Procedure: Electrical Cardioversion Indications:  Atrial Flutter  Procedure Details:  Consent: Risks of procedure as well as the alternatives and risks of each were explained to the (patient/caregiver).  Consent for procedure obtained.  Time Out: Verified patient identification, verified procedure, site/side was marked, verified correct patient position, special equipment/implants available, medications/allergies/relevent history reviewed, required imaging and test results available.  Performed  Patient placed on cardiac monitor, pulse oximetry, supplemental oxygen as necessary.  Sedation given: Propofol per anesthesia Pacer pads placed anterior and posterior chest.  Cardioverted 1 time(s).  Cardioverted at 150J biphasic.  Impression: Findings: Post procedure EKG shows: NSR Complications: None Patient did tolerate procedure well.  Plan: 1. Successful DCCV to NSR with a single 150J Biphasic shock 2. Obtain 12-lead EKG to calculate QTc  Time Spent Directly with the Patient:  30 minutes   Pixie Casino, MD, [facility], [place] Director of the Advanced Lipid Disorders &  Cardiovascular Risk Reduction Clinic Diplomate of the American Board of Clinical Lipidology Attending Cardiologist  Direct Dial: [phone]  Fax: [phone]  Website:  [email or web address] 05/02/2018, 11:23 AM

## 2018-05-03 ENCOUNTER — Other Ambulatory Visit: Payer: Self-pay

## 2018-05-03 LAB — BASIC METABOLIC PANEL WITH GFR
Anion gap: 11 (ref 5–15)
BUN: 16 mg/dL (ref 8–23)
CO2: 27 mmol/L (ref 22–32)
Calcium: 8.7 mg/dL — ABNORMAL LOW (ref 8.9–10.3)
Chloride: 97 mmol/L — ABNORMAL LOW (ref 98–111)
Creatinine, Ser: 0.82 mg/dL (ref 0.44–1.00)
GFR calc Af Amer: >60 mL/min
GFR calc non Af Amer: >60 mL/min
Glucose, Bld: 148 mg/dL — ABNORMAL HIGH (ref 70–99)
Potassium: 4.5 mmol/L (ref 3.5–5.1)
Sodium: 135 mmol/L (ref 135–145)

## 2018-05-03 LAB — MAGNESIUM: Magnesium: 2 mg/dL (ref 1.7–2.4)

## 2018-05-03 LAB — GLUCOSE, CAPILLARY
GLUCOSE-CAPILLARY: 227 mg/dL — AB (ref 70–99)
GLUCOSE-CAPILLARY: 185 mg/dL — AB (ref 70–99)
Glucose-Capillary: 126 mg/dL — ABNORMAL HIGH (ref 70–99)
Glucose-Capillary: 87 mg/dL (ref 70–99)

## 2018-05-03 MED ORDER — DOFETILIDE 125 MCG PO CAPS
125.0000 ug | ORAL_CAPSULE | Freq: Two times a day (BID) | ORAL | Status: DC
  Administered 2018-05-03 – 2018-05-04 (×3): 125 ug via ORAL
  Filled 2018-05-03 (×2): qty 1
  Filled 2018-05-03: qty 14
  Filled 2018-05-03: qty 1

## 2018-05-03 NOTE — Progress Notes (Addendum)
Electrophysiology Rounding Note  Patient Name: Heidi Stark Date of Encounter: 05/03/2018  Primary Cardiologist: [name] Electrophysiologist: [name]   Subjective   The patient is doing well today.  At this time, the patient denies chest pain, shortness of breath, or any new concerns.  Inpatient Medications    Scheduled Meds: . apixaban  5 mg Oral BID  . dofetilide  125 mcg Oral BID  . eplerenone  25 mg Oral Daily  . fenofibrate  160 mg Oral q1800  . furosemide  60 mg Oral BID  . glimepiride  2 mg Oral Q supper  . glimepiride  4 mg Oral Q breakfast  . insulin aspart  0-9 Units Subcutaneous TID WC  . insulin glargine  10 Units Subcutaneous QHS  . metFORMIN  1,000 mg Oral BID WC  . metoprolol succinate  25 mg Oral BID  . potassium chloride  30 mEq Oral BID  . rosuvastatin  10 mg Oral Daily  . sacubitril-valsartan  1 tablet Oral BID  . sodium chloride flush  3 mL Intravenous Q12H   Continuous Infusions: . sodium chloride     PRN Meds: sodium chloride, sodium chloride flush   Vital Signs    Vitals:   05/02/18 1140 05/02/18 1531 05/02/18 2015 05/03/18 0557  BP: 101/82 111/60 (!) 111/52 126/60  Pulse: 68 68 60 (!) 51  Resp: 17     Temp:  98.1 F (36.7 C) (!) 97.5 F (36.4 C) 98.2 F (36.8 C)  TempSrc:  Oral Oral Oral  SpO2: 93% 90% 94% 96%  Weight:      Height:        Intake/Output Summary (Last 24 hours) at 05/03/2018 0843 Last data filed at 05/03/2018 0600 Gross per 24 hour  Intake 480 ml  Output 0 ml  Net 480 ml   Filed Weights   04/30/18 1700 05/01/18 0405 05/02/18 0510  Weight: 122.4 kg 122.4 kg 122 kg    Physical Exam    GEN- The patient is elderly and obese appearing, alert and oriented x 3 today.   Head- normocephalic, atraumatic Eyes-  Sclera clear, conjunctiva pink Ears- hearing intact Oropharynx- clear Neck- supple Lungs- Clear to ausculation bilaterally, normal work of breathing Heart- Regular rate and rhythm  GI- soft, NT, ND, +  BS Extremities- no clubbing, cyanosis, or edema Skin- no rash or lesion Psych- euthymic mood, full affect Neuro- strength and sensation are intact  Labs    CBC No results for input(s): WBC, NEUTROABS, HGB, HCT, MCV, PLT in the last 72 hours. Basic Metabolic Panel Recent Labs    05/02/18 0618 05/03/18 0433  NA 138 135  K 3.7 4.5  CL 103 97*  CO2 27 27  GLUCOSE 145* 148*  BUN 11 16  CREATININE 0.69 0.82  CALCIUM 8.2* 8.7*  MG 1.9 2.0     Telemetry    SR (personally reviewed)  Radiology    No results found.   Patient Profile     Heidi Stark is a 77 y.o. female admitted for Tikosyn load  Assessment & Plan    1.  Persistent atrial fibrillation Maintaining SR post cardioversion QTc long this morning, will decrease Tikosyn dose to 173mcg twice daily BMET, Mg ok Continue Eliquis for CHADS2VASC of 5  2.  OSA Compliant with CPAP  3.  Chronic diastolic heart failure Euvolemic on exam Continue current therapy   Will monitor today and plan discharge tomorrow if QTc stable. She is going to apply for  Tikosyn patient assistance - will need 2 weeks supply at discharge.   For questions or updates, please contact [place] Please consult [email or web address] for contact info under Cardiology/STEMI.  Signed, Chanetta Marshall, NP  05/03/2018, 8:43 AM    I have seen, examined the patient, and reviewed the above assessment and plan.  Changes to above are made where necessary.  On exam, RRR.  Qt is prolonged.  Reduce tikosyn to 184mcg BID.  Keep in hospital another day.  Co Sign: Thompson Grayer, MD 05/03/2018

## 2018-05-03 NOTE — Care Management Important Message (Signed)
Important Message  Patient Details  Name: Heidi Stark MRN: [id number] Date of Birth: [birth year]   Medicare Important Message Given:  Yes    Orbie Pyo 05/03/2018, 2:29 PM

## 2018-05-03 NOTE — Discharge Summary (Addendum)
ELECTROPHYSIOLOGY PROCEDURE DISCHARGE SUMMARY    Patient ID: Heidi Stark,  MRN: [id number], DOB/AGE: 76-Jul-1942 76 y.o.  Admit date: 04/30/2018 Discharge date: 05/04/2018  Primary Care Physician: Ann Held, DO Primary Cardiologist: [name] Electrophysiologist: [name]  Primary Discharge Diagnosis:  1.  Persistent atrial fibrillation status post Tikosyn loading this admission  Secondary Discharge Diagnosis:  1.  Obesity 2.  Chronic diastolic heart failure 3.  OSA on CPAP 4.  HTN 5.  Diabetes 6.  Prior splenic and renal infarcts   Allergies  Allergen Reactions  . Neomycin-Bacitracin Zn-Polymyx Rash  . Sulfonamide Derivatives Other (See Comments)    Stomach cramps  . Ciprofloxacin Hives, Itching and Rash  . Spironolactone Rash     Procedures This Admission:  1.  Tikosyn loading 2.  Direct current cardioversion on 05/02/18 by Dr Debara Pickett which successfully restored SR.  There were no early apparent complications.   Brief HPI: Heidi Stark is a 77 y.o. female with a past medical history as noted above.  They were referred to EP in the outpatient setting for treatment options of atrial fibrillation.  Risks, benefits, and alternatives to Tikosyn were reviewed with the patient who wished to proceed.    Hospital Course:  The patient was admitted and Tikosyn was initiated.  Renal function and electrolytes were followed during the hospitalization.  On 05/02/18 they underwent direct current cardioversion which restored sinus rhythm. Her QTc prolonged and her Tikosyn dose was decreased to 250mcg. QTc remained prolonged and Tikosyn was decreased to 178mcg. They were monitored until discharge on telemetry which demonstrated SR.  On the day of discharge, they were examined by Dr Rayann Heman who considered them stable for discharge to home.  Follow-up has been arranged with AF clinic in 1 week and with Dr [name] in 4 weeks.   Physical Exam: Vitals:   05/04/18 0505 05/04/18  0515 05/04/18 0907 05/04/18 1332  BP: (!) 110/37 (!) 95/46 (!) 94/41 (!) 132/50  Pulse: (!) 57  63   Resp: 18     Temp: 98.5 F (36.9 C)     TempSrc: Oral     SpO2: 92%     Weight: 121.6 kg     Height:        GEN- The patient is well appearing, alert and oriented x 3 today.   HEENT: normocephalic, atraumatic; sclera clear, conjunctiva pink; hearing intact; oropharynx clear; neck supple  Lungs- Clear to ausculation bilaterally, normal work of breathing.  No wheezes, rales, rhonchi Heart- Regular rate and rhythm  GI- soft, non-tender, non-distended, bowel sounds present  Extremities- no clubbing, cyanosis, or edema  MS- no significant deformity or atrophy Skin- warm and dry, no rash or lesion Psych- euthymic mood, full affect Neuro- strength and sensation are intact   Labs:   Lab Results  Component Value Date   WBC 8.8 04/01/2018   HGB 14.7 04/01/2018   HCT 46.6 (H) 04/01/2018   MCV 96.1 04/01/2018   PLT 283 04/01/2018    Recent Labs  Lab 05/04/18 0931  NA 135  K 4.1  CL 96*  CO2 27  BUN 15  CREATININE 0.87  CALCIUM 8.7*  GLUCOSE 269*     Discharge Medications:  Allergies as of 05/04/2018      Reactions   Neomycin-bacitracin Zn-polymyx Rash   Sulfonamide Derivatives Other (See Comments)   Stomach cramps   Ciprofloxacin Hives, Itching, Rash   Spironolactone Rash      Medication List  TAKE these medications   acetaminophen 650 MG CR tablet Commonly known as:  TYLENOL Take 650 mg by mouth every 8 (eight) hours as needed for pain.   dofetilide 125 MCG capsule Commonly known as:  TIKOSYN Take 1 capsule (125 mcg total) by mouth 2 (two) times daily.   ELIQUIS 5 MG Tabs tablet Generic drug:  apixaban TAKE 1 TABLET BY MOUTH TWICE A DAY What changed:  how much to take   eplerenone 25 MG tablet Commonly known as:  INSPRA TAKE 1 TABLET BY MOUTH EVERY DAY   fenofibrate 160 MG tablet TAKE 1 TABLET BY MOUTH DAILY *NEED OFFICE VISIT FOR REFILLS* What  changed:  See the new instructions.   furosemide 40 MG tablet Commonly known as:  LASIX Take 1.5 tablets (60 mg total) by mouth 2 (two) times daily.   glimepiride 2 MG tablet Commonly known as:  AMARYL Take 2-4 mg by mouth See admin instructions. Take 2 tablets (4 mg) by mouth every morning and take 1 tablet (2 mg) by mouth every evening.   insulin glargine 100 UNIT/ML injection Commonly known as:  LANTUS Inject 10 Units into the skin at bedtime.   metFORMIN 1000 MG tablet Commonly known as:  GLUCOPHAGE TAKE 1 TABLET BY MOUTH TWICE A DAY WITH MEALS. NEED OFFICE VISIT FOR REFILLS What changed:  See the new instructions.   metoprolol succinate 25 MG 24 hr tablet Commonly known as:  TOPROL-XL TAKE 1 TABLET (25 MG TOTAL) BY MOUTH 2 (TWO) TIMES DAILY.   potassium chloride 10 MEQ tablet Commonly known as:  K-DUR,KLOR-CON Take 3 tablets (30 mEq total) by mouth 2 (two) times daily.   rosuvastatin 10 MG tablet Commonly known as:  CRESTOR Take 10 mg by mouth daily.   sacubitril-valsartan 49-51 MG Commonly known as:  ENTRESTO Take 1 tablet by mouth 2 (two) times daily.       Disposition:  Discharge Instructions    Diet - low sodium heart healthy   Complete by:  As directed    Increase activity slowly   Complete by:  As directed      Follow-up Information    [facility] Follow up on 05/13/2018.   Specialty:  Cardiology Why:  at [facility] information: [address] [id number] [place] [address] [place] Follow up on 05/13/2018.   Specialty:  Cardiology Why:  at 2:30PM  Contact information: [address] [id number] [place] [address] [phone]       Constance Haw, MD Follow up on 06/18/2018.   Specialty:  Cardiology Why:  at 12:15PM  Contact information: [place]  [id number] [phone]           Duration of Discharge Encounter: Greater than 30 minutes including physician time.  Signed, Lyda Jester, PA-C 05/04/2018 1:33 PM   I have seen, examined the patient, and reviewed the above assessment and plan.  Changes to above are made where necessary.  On exam, RRR.  QT is stable on tikosyn 125mg  BID.  OK to discharge with close outpatient follow-up.  Co Sign: Thompson Grayer, MD 05/04/2018 1:54 PM

## 2018-05-04 LAB — BASIC METABOLIC PANEL
Anion gap: 12 (ref 5–15)
BUN: 15 mg/dL (ref 8–23)
CO2: 27 mmol/L (ref 22–32)
Calcium: 8.7 mg/dL — ABNORMAL LOW (ref 8.9–10.3)
Chloride: 96 mmol/L — ABNORMAL LOW (ref 98–111)
Creatinine, Ser: 0.87 mg/dL (ref 0.44–1.00)
GFR calc Af Amer: >60 mL/min (ref >60)
GFR calc non Af Amer: >60 mL/min (ref >60)
GLUCOSE: 269 mg/dL — AB (ref 70–99)
Potassium: 4.1 mmol/L (ref 3.5–5.1)
Sodium: 135 mmol/L (ref 135–145)

## 2018-05-04 LAB — GLUCOSE, CAPILLARY
Glucose-Capillary: 212 mg/dL — ABNORMAL HIGH (ref 70–99)
Glucose-Capillary: 116 mg/dL — ABNORMAL HIGH (ref 70–99)

## 2018-05-04 LAB — MAGNESIUM: Magnesium: 2 mg/dL (ref 1.7–2.4)

## 2018-05-04 MED ORDER — DOFETILIDE 125 MCG PO CAPS: 125.0000 ug | ORAL_CAPSULE | Freq: Two times a day (BID) | ORAL | 0 refills | Status: DC

## 2018-05-04 NOTE — Progress Notes (Addendum)
Doing well this am.  No concerns.  I will repeat ekg after am dose of tikosyn and determine disposition at that time.  Thompson Grayer MD, [facility] 05/04/2018 10:49 AM    QT is stable at this time.  Ok to discharge with close follow-up in the AF clinic  Thompson Grayer MD, [facility] 05/04/2018 1:25 PM

## 2018-05-07 ENCOUNTER — Telehealth: Payer: Self-pay

## 2018-05-07 NOTE — Telephone Encounter (Signed)
TCM call made to patient startes she will be following up with Cardiology.

## 2018-05-09 ENCOUNTER — Other Ambulatory Visit (HOSPITAL_COMMUNITY): Payer: Self-pay | Admitting: Cardiology

## 2018-05-10 ENCOUNTER — Other Ambulatory Visit (HOSPITAL_COMMUNITY): Payer: Self-pay | Admitting: Cardiology

## 2018-05-13 ENCOUNTER — Ambulatory Visit (HOSPITAL_COMMUNITY)
Admission: RE | Admit: 2018-05-13 | Discharge: 2018-05-13 | Disposition: A | Discharge status: Home / Self Care | Payer: Medicare Other | Source: Ambulatory Visit | Attending: Nurse Practitioner | Admitting: Nurse Practitioner

## 2018-05-13 ENCOUNTER — Encounter (HOSPITAL_COMMUNITY): Payer: Self-pay

## 2018-05-13 ENCOUNTER — Ambulatory Visit (HOSPITAL_COMMUNITY)
Admission: RE | Admit: 2018-05-13 | Discharge: 2018-05-13 | Disposition: A | Discharge status: Home / Self Care | Payer: Medicare Other | Source: Ambulatory Visit | Attending: Cardiology | Admitting: Cardiology

## 2018-05-13 VITALS — BP 98/64 | HR 74 | Wt 271.6 lb

## 2018-05-13 DIAGNOSIS — Z794 Long term (current) use of insulin: Secondary | ICD-10-CM | POA: Insufficient documentation

## 2018-05-13 DIAGNOSIS — E119 Type 2 diabetes mellitus without complications: Secondary | ICD-10-CM | POA: Diagnosis not present

## 2018-05-13 DIAGNOSIS — I5022 Chronic systolic (congestive) heart failure: Secondary | ICD-10-CM | POA: Diagnosis not present

## 2018-05-13 DIAGNOSIS — E785 Hyperlipidemia, unspecified: Secondary | ICD-10-CM | POA: Diagnosis not present

## 2018-05-13 DIAGNOSIS — Z79899 Other long term (current) drug therapy: Secondary | ICD-10-CM | POA: Diagnosis not present

## 2018-05-13 DIAGNOSIS — I5023 Acute on chronic systolic (congestive) heart failure: Secondary | ICD-10-CM

## 2018-05-13 DIAGNOSIS — Z87891 Personal history of nicotine dependence: Secondary | ICD-10-CM | POA: Insufficient documentation

## 2018-05-13 DIAGNOSIS — I5042 Chronic combined systolic (congestive) and diastolic (congestive) heart failure: Secondary | ICD-10-CM | POA: Diagnosis not present

## 2018-05-13 DIAGNOSIS — G4733 Obstructive sleep apnea (adult) (pediatric): Secondary | ICD-10-CM

## 2018-05-13 DIAGNOSIS — J449 Chronic obstructive pulmonary disease, unspecified: Secondary | ICD-10-CM | POA: Insufficient documentation

## 2018-05-13 DIAGNOSIS — I110 Hypertensive heart disease with heart failure: Secondary | ICD-10-CM | POA: Insufficient documentation

## 2018-05-13 DIAGNOSIS — Z7901 Long term (current) use of anticoagulants: Secondary | ICD-10-CM | POA: Diagnosis not present

## 2018-05-13 DIAGNOSIS — I739 Peripheral vascular disease, unspecified: Secondary | ICD-10-CM

## 2018-05-13 DIAGNOSIS — I480 Paroxysmal atrial fibrillation: Secondary | ICD-10-CM | POA: Insufficient documentation

## 2018-05-13 DIAGNOSIS — I2510 Atherosclerotic heart disease of native coronary artery without angina pectoris: Secondary | ICD-10-CM | POA: Insufficient documentation

## 2018-05-13 DIAGNOSIS — I350 Nonrheumatic aortic (valve) stenosis: Secondary | ICD-10-CM | POA: Diagnosis not present

## 2018-05-13 DIAGNOSIS — I4892 Unspecified atrial flutter: Secondary | ICD-10-CM | POA: Diagnosis not present

## 2018-05-13 LAB — BASIC METABOLIC PANEL
Anion gap: 12 (ref 5–15)
BUN: 14 mg/dL (ref 8–23)
CALCIUM: 8.7 mg/dL — AB (ref 8.9–10.3)
CO2: 25 mmol/L (ref 22–32)
CREATININE: 1.04 mg/dL — AB (ref 0.44–1.00)
Chloride: 101 mmol/L (ref 98–111)
GFR calc Af Amer: 59 mL/min — ABNORMAL LOW (ref >60)
GFR, EST NON AFRICAN AMERICAN: 51 mL/min — AB (ref >60)
GLUCOSE: 219 mg/dL — AB (ref 70–99)
Potassium: 5 mmol/L (ref 3.5–5.1)
Sodium: 138 mmol/L (ref 135–145)

## 2018-05-13 LAB — MAGNESIUM: Magnesium: 2.1 mg/dL (ref 1.7–2.4)

## 2018-05-13 NOTE — Patient Instructions (Addendum)
Today you have been seen at the Heart failure clinic at [facility] had Lab work done today  We will call you if your lab work is abnormal.. No news is good news!!   Follow up with Dr.Mclean 07/31/2018 at 2:00pm  Do the following things EVERYDAY: 1) Weigh yourself in the morning before breakfast. Write it down and keep it in a log. 2) Take your medicines as prescribed 3) Eat low salt foods-Limit salt (sodium) to 2000 mg per day.  4) Stay as active as you can everyday 5) Limit all fluids for the day to less than 2 liters

## 2018-05-13 NOTE — Progress Notes (Signed)
Patient ID: Heidi Stark, female   DOB: 07/10/1941, 77 y.o.   MRN: [id number]     Advanced Heart Failure Clinic Note  PCP: Dr. [name] Cardiology: Dr. Horald Chestnut Heidi Stark is a 77 y.o. female with history of HTN, DM, hyperlipidemia presents for cardiology followup.  She was admitted in 4/15 with splenic and renal infarcts.  She was found to have paroxysmal atrial fibrillation.  In 4/15, she had fever and flank pain.  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  TEE showed prominent aortic plaque.  While she was in the hospital, she was noted to have a brief run of atrial fibrillation and was started on coumadin.    She has left leg pain with ambulation after about 100 feet, LE dopplers 5/15 showed occluded left external iliac artery.  This has been present ever since she had left iliac damage with a prior back surgery.    She was stable until 1/18, when she developed palpitations and exertional dyspnea.  She was admitted to [facility] with atrial fibrillation/RVR and acute systolic CHF.  EF was found to be 30-35%, down from 50-55% in 2015.  She was diuresed and underwent DCCV back to NSR.  She subsequently had atrial fibrillation ablation in 4/18.  She went back into atrial fibrillation in 5/19 and had DCCV back today NSR.   Echo was done 6/19 and reviewed, EF 55%, mild LVH, moderate diastolic dysfunction, normal RV size and systolic function.   She prevents today for regular follow up. Admitted 10/15 - 10/18 for Tikosyn load. Underwent DCCV 05/02/18. Feeling good since discharge from hospital. She does not feel it when she's in afib. She hasn't had any palpitations or worsening SOB. Can do her ADLs now without difficulty. No CP. No lightheadedness or dizziness. No BRBPR or melena. Weight stable. Not smoking. Taking all medications as directed. She has an appointment in the Afib clinic immediately following this one. Spoke with providers, will cancel.   EKG today  (Personally reviewed) NSR 75 bpm, QTc 486 cm  Labs (4/15): K 4.1, creatinine 0.7, HCT 46.2 Labs (7/15): K 3.9, creatinine 0.8, HCT 50 Labs (12/15): K 5, creatinine 0.7, HDL 33, LDL 57 Labs (5/16): K 4, creatinine 0.84, TGs 256, HDL 29, LDL 42, HCT 41.2 Labs (1/18): K 3.4, creatinine 0.78 Labs (2/18): K 4.4, creatinine 0.8, free T3/T4 normal, mildly elevated TSH, LFTs normal, hgb 15.6 Labs (4/18): K 4.2, creatinine 0.86, TSH elevated, free T3 and free T4 normal Labs (5/19): K 3.9, creatinine 0.78 Labs (6/19): K 4.4, creatinine 0.88  PMH: 1.Type II diabetes 2. HTN 3. Hyperlipidemia 4. COPD: has quit smoking.  5. CAD: Coronary CTA (4/18) with nonobstructive coronary disease, calcium score 335 Agatston units (82nd percentile).  6. Paroxysmal atrial fibrillation: First noted in 4/15.  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   - Event monitor (4/15) with no atrial fibrillation.  - Admission with atrial fibrillation/RVR in 1/18.  - Atrial fibrillation ablation in 4/18.  - DCCV in 5/19.  7. Chronic systolic CHF: ?tachycardia-mediated as first noted in setting of atrial fibrillation with RVR.  - TEE (4/15) with EF 50-55%, mild LVH, grade III-IV plaque in descending thoracic aorta, RV normal, no PFO, peak RV-RA gradient 42 mmHg.   - Echo (1/18) with EF 30-35%, diffuse hypokinesis, moderate LVH, mildly dilated RV, PASP 55 mmHg.  - Echo (4/18) with EF 50%, anterolateral/inferolateral mild hypokinesis, mild aortic  stenosis, IVC normal.  - Echo (6/19) with EF 55%, mild LVH, moderate diastolic dysfunction, normal RV size and systolic function.  7. H/o diskectomy. 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 Lower extremity arterial dopplers (5/15) with occluded left external iliac artery.  - ABIs (5/18): 1.16 (normal) on right, 0.56 on left.  9. Aortic stenosis: Mild on 4/18 echo.  10. OSA: Uses CPAP  SH: Married, 2 kids, quit smoking in  2019.   FH: Daughter with factor V Leiden deficiency.  Brother with CABG.  Sister with aneurysm.   Review of systems complete and found to be negative unless listed in HPI.    Current Outpatient Medications  Medication Sig Dispense Refill  . acetaminophen (TYLENOL) 650 MG CR tablet Take 650 mg by mouth every 8 (eight) hours as needed for pain.    Marland Kitchen dofetilide (TIKOSYN) 125 MCG capsule Take 1 capsule (125 mcg total) by mouth 2 (two) times daily. 30 capsule 0  . ELIQUIS 5 MG TABS tablet TAKE 1 TABLET BY MOUTH TWICE A DAY (Patient taking differently: Take 5 mg by mouth 2 (two) times daily. ) 180 tablet 1  . ENTRESTO 49-51 MG TAKE 1 TABLET BY MOUTH TWICE A DAY 60 tablet 3  . eplerenone (INSPRA) 25 MG tablet TAKE 1 TABLET BY MOUTH EVERY DAY (Patient taking differently: Take 25 mg by mouth daily. ) 90 tablet 3  . fenofibrate 160 MG tablet TAKE 1 TABLET BY MOUTH DAILY *NEED OFFICE VISIT FOR REFILLS* (Patient taking differently: Take 160 mg by mouth daily. ) 30 tablet 0  . furosemide (LASIX) 40 MG tablet Take 1.5 tablets (60 mg total) by mouth 2 (two) times daily. 270 tablet 3  . glimepiride (AMARYL) 2 MG tablet Take 2-4 mg by mouth See admin instructions. Take 2 tablets (4 mg) by mouth every morning and take 1 tablet (2 mg) by mouth every evening.    . insulin glargine (LANTUS) 100 UNIT/ML injection Inject 10 Units into the skin at bedtime.    . metFORMIN (GLUCOPHAGE) 1000 MG tablet TAKE 1 TABLET BY MOUTH TWICE A DAY WITH MEALS. NEED OFFICE VISIT FOR REFILLS (Patient taking differently: Take 1,000 mg by mouth 2 (two) times daily with a meal. ) 60 tablet 0  . metoprolol succinate (TOPROL-XL) 25 MG 24 hr tablet TAKE 1 TABLET BY MOUTH TWICE A DAY 180 tablet 2  . potassium chloride (KLOR-CON M10) 10 MEQ tablet Take 3 tablets (30 mEq total) by mouth 2 (two) times daily. 180 tablet 3  . rosuvastatin (CRESTOR) 10 MG tablet Take 10 mg by mouth daily.    . sacubitril-valsartan (ENTRESTO) 49-51 MG Take 1 tablet  by mouth 2 (two) times daily. 60 tablet 11   No current facility-administered medications for this encounter.    Vitals:   05/13/18 1356  BP: 98/64  Pulse: 74  SpO2: 92%  Weight: 123.2 kg (271 lb 9.6 oz)    Wt Readings from Last 3 Encounters:  05/13/18 123.2 kg (271 lb 9.6 oz)  05/04/18 121.6 kg (268 lb 1.6 oz)  04/30/18 121.6 kg (268 lb)    Physical Exam General: Well appearing. No resp difficulty. HEENT: Normal Neck: Supple. JVP 6-7 cm. Carotids 2+ bilat; no bruits. No thyromegaly or nodule noted. Cor: PMI nondisplaced. RRR, No M/G/R noted Lungs: CTAB, normal effort. Abdomen: Soft, non-tender, non-distended, no HSM. No bruits or masses. +BS  Extremities: No cyanosis, clubbing, or rash. R and LLE no edema.  Neuro: Alert &  orientedx3, cranial nerves grossly intact. moves all 4 extremities w/o difficulty. Affect pleasant   Assessment/Plan: 1. Atrial fibrillation/flutter: Paroxysmal.   - She has a history of presumed cardioembolic splenic and renal infarcts. She was admitted in 1/18 with atrial fibrillation/RVR and CHF.  She was diuresed and had DCCV back to NSR.  She had atrial fibrillation ablation in 4/18.  In 5/19, she went into atrial fibrillation transiently. She is in NSR today s/p DCCV 10/17 and tikosyn initiation.  - Continue Eliquis.  Denies bleeding.  2. Claudication:  - Left leg claudication with absent left PT pulse.  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   3. Chronic systolic => diastolic CHF: -  EF 36-14% on echo in 1/18, down from 50-55% in 4/15.  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  - NYHA II-III symptoms - Volume status stable on exam - Continue lasix 60 mg BID.  - Continue Toprol XL 25 mg bid.  - Continue Entresto 49/51 bid  - ?rash from spironolactone, now on eplerenone 25 mg daily.    -  Reinforced fluid restriction to < 2 L daily, sodium restriction to less than 2000 mg daily, and the importance of daily weights.   4. CAD:  - Coronary CTA showed coronary calcium score in the 82nd percentile with nonobstructive coronary disease.  - Not on ASA 81 as she is taking Eliqius.  - Continue Crestor.  Lipids stable 04/01/18.  5. COPD:  - Encouraged continued cessation. No wheeze on exam.   BMET and Mg today on Tikosyn. In NSR today. RTC 2 months. Sooner with symptoms. (sees EP in 1 month)  Shirley Friar, PA-C  05/13/2018  Greater than 50% of the 30 minute visit was spent in counseling/coordination of care regarding disease state education, salt/fluid restriction, sliding scale diuretics, and medication compliance.

## 2018-05-14 ENCOUNTER — Telehealth (HOSPITAL_COMMUNITY): Payer: Self-pay | Admitting: *Deleted

## 2018-05-14 ENCOUNTER — Encounter (HOSPITAL_COMMUNITY): Payer: Self-pay

## 2018-05-14 NOTE — Telephone Encounter (Signed)
Patient approved for tikosyn assistance through 07/16/18 Patient ID #[id number]

## 2018-05-14 NOTE — Addendum Note (Signed)
Encounter addended by: Jorge Ny, LCSW on: 05/14/2018 10:44 AM  Actions taken: Visit Navigator Flowsheet section accepted

## 2018-05-15 ENCOUNTER — Telehealth (HOSPITAL_COMMUNITY): Payer: Self-pay | Admitting: Surgery

## 2018-05-15 NOTE — Telephone Encounter (Signed)
Patient scheduled for follow-up labs on November 13th.  I called and she is agreeable and grateful for the follow-up.

## 2018-05-15 NOTE — Telephone Encounter (Signed)
Her labs were hemolyzed so the potassium was falsely elevated, while everything else isn't really affected. She should continue her same potassium for now.  Can recheck BMET in 1-2 weeks if that would make her feel better!   Legrand Como [address] [place], PA-C 05/15/2018 3:17 PM

## 2018-05-15 NOTE — Telephone Encounter (Signed)
Patient called concerned that she was now taking too much potassium as latest level was 5.0.  Please advise if you would like her to alter her Potassium dose.

## 2018-05-28 ENCOUNTER — Other Ambulatory Visit: Payer: Self-pay | Admitting: Family Medicine

## 2018-05-28 ENCOUNTER — Encounter: Payer: Self-pay | Admitting: Cardiology

## 2018-05-28 DIAGNOSIS — E1151 Type 2 diabetes mellitus with diabetic peripheral angiopathy without gangrene: Secondary | ICD-10-CM

## 2018-05-29 ENCOUNTER — Ambulatory Visit (HOSPITAL_COMMUNITY)
Admission: RE | Admit: 2018-05-29 | Discharge: 2018-05-29 | Disposition: A | Discharge status: Home / Self Care | Payer: Medicare Other | Source: Ambulatory Visit | Attending: Internal Medicine | Admitting: Internal Medicine

## 2018-05-29 DIAGNOSIS — I4891 Unspecified atrial fibrillation: Secondary | ICD-10-CM | POA: Insufficient documentation

## 2018-05-29 LAB — BASIC METABOLIC PANEL
ANION GAP: 14 (ref 5–15)
BUN: 18 mg/dL (ref 8–23)
CALCIUM: 8.7 mg/dL — AB (ref 8.9–10.3)
CO2: 27 mmol/L (ref 22–32)
Chloride: 98 mmol/L (ref 98–111)
Creatinine, Ser: 0.95 mg/dL (ref 0.44–1.00)
GFR calc Af Amer: >60 mL/min (ref >60)
GFR, EST NON AFRICAN AMERICAN: 57 mL/min — AB (ref >60)
Glucose, Bld: 147 mg/dL — ABNORMAL HIGH (ref 70–99)
POTASSIUM: 4.3 mmol/L (ref 3.5–5.1)
SODIUM: 139 mmol/L (ref 135–145)

## 2018-06-03 ENCOUNTER — Encounter (HOSPITAL_COMMUNITY): Payer: Self-pay

## 2018-06-17 NOTE — Progress Notes (Signed)
Electrophysiology Office Note   Date:  06/18/2018   ID:  Heidi Stark, Heidi Stark March 24, 1941, MRN [id number]  PCP:  [name], [name], DO  Cardiologist:  [name] Primary Electrophysiologist:  Dr [name]  CC: Follow up for atrial fibrillation   History of Present Illness: Heidi Stark is a 77 y.o. female who is being seen today for the evaluation of atrial fibrillatoin at the request of [name], [name], *. Presenting today for electrophysiology evaluation.  She has a history of diastolic heart failure, COPD, DVT, hypertension, hyperlipidemia, OSA on CPAP, paroxysmal atrial fibrillation, type 2 diabetes.  She wears oxygen to sleep at night.  She was admitted on 4/15 with splenic and renal infarcts.  She was noted to have paroxysmal atrial fibrillation.  She was started on Coumadin at that time.  She was noted to have left leg pain with ambulation of about 100 feet.  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  On 1/18 she developed L potation's and exertional dyspnea.  She was found to be in atrial fibrillation with rapid rates and acute systolic heart failure.  Her ejection fraction was 30 to 35% down from normal in 2015.  She was diuresed and underwent cardioversion.  She had an atrial fibrillation ablation 10/2016.  She was seen in clinic 11/2017 and was found to be in atrial fibrillation and is status post cardioversion.  Admitted on 04/30/18 for Tikosyn loading. She reports that she has done well since that time. Her symptoms of fatigue have improved on the medication.  Today, denies symptoms of palpitations, chest pain, shortness of breath, orthopnea, PND, claudication, dizziness, presyncope, syncope, bleeding, or neurologic sequela. The patient is tolerating medications without difficulties.   Past Medical History:  Diagnosis Date  . Arthritis    "hands, wrists" (11/07/2016)  . CHF (congestive heart failure) ([place])   . Chronic lower back pain   . COPD  (chronic obstructive pulmonary disease) ([place])   . DVT (deep venous thrombosis) ([place])   . History of blood transfusion 1990   "related to OR"  . History of kidney stones   . Hyperlipidemia   . Hypertension   . OSA on CPAP   . Persistent atrial fibrillation   . Pneumonia    "several times" (11/07/2016)  . Type II diabetes mellitus ([place])    Past Surgical History:  Procedure Laterality Date  . ATRIAL FIBRILLATION ABLATION N/A 11/07/2016   Procedure: Atrial Fibrillation Ablation;  Surgeon: Storie Heffern [name], MD;  Location: [facility];  Service: Cardiovascular;  Laterality: N/A;  . BACK SURGERY    . CARDIAC CATHETERIZATION    . CARDIOVERSION N/A 08/11/2016   Procedure: CARDIOVERSION;  Surgeon: Larey Dresser, MD;  Location: [facility];  Service: Cardiovascular;  Laterality: N/A;  . CARDIOVERSION N/A 12/03/2017   Procedure: CARDIOVERSION;  Surgeon: Larey Dresser, MD;  Location: [facility];  Service: Cardiovascular;  Laterality: N/A;  . CARDIOVERSION N/A 05/02/2018   Procedure: CARDIOVERSION;  Surgeon: Pixie Casino, MD;  Location: [facility];  Service: Cardiovascular;  Laterality: N/A;  . CATARACT EXTRACTION W/ INTRAOCULAR LENS  IMPLANT, BILATERAL Bilateral   . COLON SURGERY  1990   vein graft and colon repair after nicked artery with back surgert  . COLONOSCOPY    . CYSTECTOMY     between bladder and kidneys  . GANGLION CYST EXCISION Left   . KNEE CARTILAGE SURGERY Right 1960s  . [place] SURGERY  1990  .  REPAIR ILIAC ARTERY  1990   vein graft and colon repair after nicked artery with back surgert  . TEE WITHOUT CARDIOVERSION N/A 10/31/2013   Procedure: TRANSESOPHAGEAL ECHOCARDIOGRAM (TEE);  Surgeon: Larey Dresser, MD;  Location: [place];  Service: Cardiovascular;  Laterality: N/A;  . TUBAL LIGATION       Current Outpatient Medications  Medication Sig Dispense Refill  . acetaminophen (TYLENOL) 650 MG CR tablet Take 650 mg by mouth every 8 (eight)  hours as needed for pain.    Marland Kitchen dofetilide (TIKOSYN) 125 MCG capsule Take 1 capsule (125 mcg total) by mouth 2 (two) times daily. 30 capsule 0  . ELIQUIS 5 MG TABS tablet TAKE 1 TABLET BY MOUTH TWICE A DAY (Patient taking differently: Take 5 mg by mouth 2 (two) times daily. ) 180 tablet 1  . ENTRESTO 49-51 MG TAKE 1 TABLET BY MOUTH TWICE A DAY 60 tablet 3  . eplerenone (INSPRA) 25 MG tablet TAKE 1 TABLET BY MOUTH EVERY DAY (Patient taking differently: Take 25 mg by mouth daily. ) 90 tablet 3  . fenofibrate 160 MG tablet TAKE 1 TABLET BY MOUTH DAILY *NEEDS OFFICE VISIT FOR REFILLS 30 tablet 0  . furosemide (LASIX) 40 MG tablet Take 1.5 tablets (60 mg total) by mouth 2 (two) times daily. 270 tablet 3  . glimepiride (AMARYL) 2 MG tablet Take 2-4 mg by mouth See admin instructions. Take 2 tablets (4 mg) by mouth every morning and take 1 tablet (2 mg) by mouth every evening.    . insulin glargine (LANTUS) 100 UNIT/ML injection Inject 10 Units into the skin at bedtime.    . metFORMIN (GLUCOPHAGE) 1000 MG tablet TAKE 1 TABLET BY MOUTH TWICE A DAY *NEED OFFICE VISIT* 60 tablet 0  . metoprolol succinate (TOPROL-XL) 25 MG 24 hr tablet TAKE 1 TABLET BY MOUTH TWICE A DAY 180 tablet 2  . potassium chloride (KLOR-CON M10) 10 MEQ tablet Take 3 tablets (30 mEq total) by mouth 2 (two) times daily. 180 tablet 3  . rosuvastatin (CRESTOR) 10 MG tablet Take 10 mg by mouth daily.    . sacubitril-valsartan (ENTRESTO) 49-51 MG Take 1 tablet by mouth 2 (two) times daily. 60 tablet 11   No current facility-administered medications for this visit.     Allergies:   Neomycin-bacitracin zn-polymyx; Sulfonamide derivatives; Ciprofloxacin; and Spironolactone   Social History:  The patient  reports that she quit smoking about 2 months ago. Her smoking use included cigarettes. She has a 45.00 pack-year smoking history. She has never used smokeless tobacco. She reports that she does not drink alcohol or use drugs.   Family  History:  The patient's family history includes Diabetes in her unknown relative; Factor V Leiden deficiency in her daughter; Melanoma in her unknown relative.   ROS:  Please see the history of present illness.   Otherwise, review of systems is positive for lower extremity edema.   All other systems are reviewed and negative.   PHYSICAL EXAM: VS:  BP 122/64   Pulse 82   Ht 5\' 6"  (1.676 m)   Wt 269 lb (122 kg)   SpO2 97%   BMI 43.42 kg/m  , BMI Body mass index is 43.42 kg/m. GEN: Well nourished, well developed obese female, in no acute distress  HEENT: normal  Neck: no JVD, carotid bruits, or masses Cardiac: RRR; no murmurs, rubs, or gallops,no edema  Respiratory:  clear to auscultation bilaterally, normal work of breathing MS: no deformity or atrophy  Skin: warm and dry Neuro:  Strength and sensation are intact Psych: euthymic mood, full affect  EKG:  EKG is ordered today. Personal review of the ekg ordered shows Sinus rhythm with 1st degree AV block, LAD. QTc 492 manually calculated.  Recent Labs: 12/03/2017: ALT 15 04/01/2018: Hemoglobin 14.7; Platelets 283 05/13/2018: Magnesium 2.1 05/29/2018: BUN 18; Creatinine, Ser 0.95; Potassium 4.3; Sodium 139    Lipid Panel     Component Value Date/Time   CHOL 139 04/01/2018 1440   TRIG 380 (H) 04/01/2018 1440   HDL 29 (L) 04/01/2018 1440   CHOLHDL 4.8 04/01/2018 1440   VLDL 76 (H) 04/01/2018 1440   LDLCALC 34 04/01/2018 1440   LDLDIRECT 42.0 11/30/2014 1226     Wt Readings from Last 3 Encounters:  06/18/18 269 lb (122 kg)  05/13/18 271 lb 9.6 oz (123.2 kg)  05/04/18 268 lb 1.6 oz (121.6 kg)      Other studies Reviewed: Additional studies/ records that were reviewed today include: TTE 12/21/17  Review of the above records today demonstrates:  - Left ventricle: The cavity size was normal. Wall thickness was   increased in a pattern of mild LVH. The estimated ejection   fraction was 55%. Although no diagnostic regional  wall motion   abnormality was identified, this possibility cannot be completely   excluded on the basis of this study. Features are consistent with   a pseudonormal left ventricular filling pattern, with concomitant   abnormal relaxation and increased filling pressure (grade 2   diastolic dysfunction). - Aortic valve: There was no stenosis. - Mitral valve: Moderately fibrotic annulus. There was no   significant regurgitation. - Left atrium: The atrium was moderately dilated. - Right ventricle: The cavity size was normal. Systolic function   was normal. - Right atrium: The atrium was mildly dilated. - Tricuspid valve: Peak RV-RA gradient (S): 16 mm Hg. - Pulmonary arteries: PA peak pressure: 19 mm Hg (S). - Inferior vena cava: The vessel was normal in size. The   respirophasic diameter changes were in the normal range (>= 50%),   consistent with normal central venous pressure.   ASSESSMENT AND PLAN:  1.  Paroxysmal atrial fibrillation: Currently on Eliquis.  Normal rhythm today.   Patient s/p Tikosyn loading on 04/30/18. Her symptoms of fatigue have resolved. Continue Tikosyn 141mcg BID Continue Toprol 25 BID Continue Eliquis 5mg  BID Continue K+ supplementation. Recent Bmet and Magnesium reviewed.  This patients CHA2DS2-VASc Score and unadjusted Ischemic Stroke Rate (% per year) is equal to 9.7 % stroke rate/year from a score of 6  Above score calculated as 1 point each if present [CHF, HTN, DM, Vascular=MI/PAD/Aortic Plaque, Age if 65-74, or Female] Above score calculated as 2 points each if present [Age > 75, or Stroke/TIA/TE]  2.  Chronic systolic heart failure: Ejection fraction has improved.  She has class I-II symptoms.  Currently on Toprol-XL, eplerenone, Lasix and Entresto.    3.  Coronary artery disease: CTA with a calcium score in the 82nd percentile with nonobstructive disease.  No anginal symptoms. Continue Crestor  4.  COPD: Advised to quit smoking.  5.  OSA Patient reports compliance with CPAP therapy.  Current medicines are reviewed at length with the patient today.   The patient does not have concerns regarding her medicines.  The following changes were made today:  none  Labs/ tests ordered today include:  Orders Placed This Encounter  Procedures  . EKG 12-Lead     Disposition:  FU with Lakaisha Danish 6 months  Signed, Izabel Chim [name], MD  06/18/2018 12:54 PM     [facility] [address] [place] [address] [phone] (office) [phone] (fax)  I have seen and examined this patient with Adline Peals.  Agree with above, note added to reflect my findings.  On exam, RRR, no murmurs, lungs clear.  Currently feeling well without chest pain or shortness of breath.  She is noted no further atrial fibrillation.  She is able to do all of her daily activities.  Her energy level is good.  No changes at this time.  Jaber Dunlow M. Noriel Guthrie MD 06/18/2018 12:54 PM

## 2018-06-18 ENCOUNTER — Ambulatory Visit: Payer: Medicare Other | Admitting: Cardiology

## 2018-06-18 ENCOUNTER — Encounter: Payer: Self-pay | Admitting: Cardiology

## 2018-06-18 VITALS — BP 122/64 | HR 82 | Ht 66.0 in | Wt 269.0 lb

## 2018-06-18 DIAGNOSIS — I5022 Chronic systolic (congestive) heart failure: Secondary | ICD-10-CM

## 2018-06-18 DIAGNOSIS — I480 Paroxysmal atrial fibrillation: Secondary | ICD-10-CM | POA: Diagnosis not present

## 2018-06-18 DIAGNOSIS — I2510 Atherosclerotic heart disease of native coronary artery without angina pectoris: Secondary | ICD-10-CM

## 2018-06-18 DIAGNOSIS — G4733 Obstructive sleep apnea (adult) (pediatric): Secondary | ICD-10-CM | POA: Diagnosis not present

## 2018-06-18 NOTE — Patient Instructions (Signed)
Medication Instructions:  Your physician recommends that you continue on your current medications as directed. Please refer to the Current Medication list given to you today.  * If you need a refill on your cardiac medications before your next appointment, please call your pharmacy.   Labwork: None ordered  Testing/Procedures: None ordered  Follow-Up: Your physician wants you to follow-up in: 6 months with Dr. Camnitz.  You will receive a reminder letter in the mail two months in advance. If you don't receive a letter, please call our office to schedule the follow-up appointment.  *Please note that any paperwork needing to be filled out by the provider will need to be addressed at the front desk prior to seeing the provider. Please note that any FMLA, disability or other documents regarding health condition is subject to a $25.00 charge that must be received prior to completion of paperwork in the form of a money order or check.  Thank you for choosing [facility]!!   Jesse Hirst, RN [phone]        

## 2018-06-19 ENCOUNTER — Telehealth (HOSPITAL_COMMUNITY): Payer: Self-pay | Admitting: Surgery

## 2018-06-19 NOTE — Telephone Encounter (Signed)
Patient called regarding her Tikosyn prescription.  She tells me that she was told by staff at the [facility] to call when she opened her last bottle of Tikosyn.  She is currently on patient assistance for this medication.  I will forward to [facility] Clinic Pharmacist.

## 2018-06-19 NOTE — Telephone Encounter (Signed)
tikosyn ordered - will be released for shipping on 07/13/2018. Order ID #[id number]

## 2018-06-25 ENCOUNTER — Other Ambulatory Visit: Payer: Self-pay | Admitting: Family Medicine

## 2018-06-28 ENCOUNTER — Ambulatory Visit (INDEPENDENT_AMBULATORY_CARE_PROVIDER_SITE_OTHER): Payer: Medicare Other | Admitting: Family Medicine

## 2018-06-28 ENCOUNTER — Encounter: Payer: Self-pay | Admitting: Family Medicine

## 2018-06-28 DIAGNOSIS — L02413 Cutaneous abscess of right upper limb: Secondary | ICD-10-CM

## 2018-06-28 DIAGNOSIS — Z23 Encounter for immunization: Secondary | ICD-10-CM

## 2018-06-28 DIAGNOSIS — I480 Paroxysmal atrial fibrillation: Secondary | ICD-10-CM | POA: Diagnosis not present

## 2018-06-28 DIAGNOSIS — E1151 Type 2 diabetes mellitus with diabetic peripheral angiopathy without gangrene: Secondary | ICD-10-CM | POA: Diagnosis not present

## 2018-06-28 DIAGNOSIS — I10 Essential (primary) hypertension: Secondary | ICD-10-CM

## 2018-06-28 DIAGNOSIS — E1165 Type 2 diabetes mellitus with hyperglycemia: Secondary | ICD-10-CM

## 2018-06-28 DIAGNOSIS — E785 Hyperlipidemia, unspecified: Secondary | ICD-10-CM

## 2018-06-28 DIAGNOSIS — IMO0002 Reserved for concepts with insufficient information to code with codable children: Secondary | ICD-10-CM

## 2018-06-28 MED ORDER — DOXYCYCLINE HYCLATE 100 MG PO TABS: 100.0000 mg | ORAL_TABLET | Freq: Two times a day (BID) | ORAL | 0 refills | Status: DC

## 2018-06-28 NOTE — Patient Instructions (Signed)
Preventive Care 76 Years and Older, Female Preventive care refers to lifestyle choices and visits with your health care provider that can promote health and wellness. What does preventive care include?  A yearly physical exam. This is also called an annual well check.  Dental exams once or twice a year.  Routine eye exams. Ask your health care provider how often you should have your eyes checked.  Personal lifestyle choices, including: ? Daily care of your teeth and gums. ? Regular physical activity. ? Eating a healthy diet. ? Avoiding tobacco and drug use. ? Limiting alcohol use. ? Practicing safe sex. ? Taking low-dose aspirin every day. ? Taking vitamin and mineral supplements as recommended by your health care provider. What happens during an annual well check? The services and screenings done by your health care provider during your annual well check will depend on your age, overall health, lifestyle risk factors, and family history of disease. Counseling Your health care provider may ask you questions about your:  Alcohol use.  Tobacco use.  Drug use.  Emotional well-being.  Home and relationship well-being.  Sexual activity.  Eating habits.  History of falls.  Memory and ability to understand (cognition).  Work and work environment.  Reproductive health.  Screening You may have the following tests or measurements:  Height, weight, and BMI.  Blood pressure.  Lipid and cholesterol levels. These may be checked every 5 years, or more frequently if you are over 50 years old.  Skin check.  Lung cancer screening. You may have this screening every year starting at age 76 if you have a 30-pack-year history of smoking and currently smoke or have quit within the past 15 years.  Fecal occult blood test (FOBT) of the stool. You may have this test every year starting at age 76.  Flexible sigmoidoscopy or colonoscopy. You may have a sigmoidoscopy every 5 years or  a colonoscopy every 10 years starting at age 76.  Hepatitis C blood test.  Hepatitis B blood test.  Sexually transmitted disease (STD) testing.  Diabetes screening. This is done by checking your blood sugar (glucose) after you have not eaten for a while (fasting). You may have this done every 1-3 years.  Bone density scan. This is done to screen for osteoporosis. You may have this done starting at age 76.  Mammogram. This may be done every 1-2 years. Talk to your health care provider about how often you should have regular mammograms.  Talk with your health care provider about your test results, treatment options, and if necessary, the need for more tests. Vaccines Your health care provider may recommend certain vaccines, such as:  Influenza vaccine. This is recommended every year.  Tetanus, diphtheria, and acellular pertussis (Tdap, Td) vaccine. You may need a Td booster every 10 years.  Varicella vaccine. You may need this if you have not been vaccinated.  Zoster vaccine. You may need this after age 76.  Measles, mumps, and rubella (MMR) vaccine. You may need at least one dose of MMR if you were born in 1957 or later. You may also need a second dose.  Pneumococcal 13-valent conjugate (PCV13) vaccine. One dose is recommended after age 76.  Pneumococcal polysaccharide (PPSV23) vaccine. One dose is recommended after age 76.  Meningococcal vaccine. You may need this if you have certain conditions.  Hepatitis A vaccine. You may need this if you have certain conditions or if you travel or work in places where you may be exposed to hepatitis   A.  Hepatitis B vaccine. You may need this if you have certain conditions or if you travel or work in places where you may be exposed to hepatitis B.  Haemophilus influenzae type b (Hib) vaccine. You may need this if you have certain conditions.  Talk to your health care provider about which screenings and vaccines you need and how often you  need them. This information is not intended to replace advice given to you by your health care provider. Make sure you discuss any questions you have with your health care provider. Document Released: 07/30/2015 Document Revised: 03/22/2016 Document Reviewed: 05/04/2015 Elsevier Interactive Patient Education  2018 [employer]  

## 2018-06-28 NOTE — Progress Notes (Signed)
Subjective:     Heidi Stark is a 77 y.o. female and is here for a comprehensive physical exam. The patient reports no problems.  Social History   Socioeconomic History  . Marital status: Married    Spouse name: Not on file  . Number of children: 2  . Years of education: Not on file  . Highest education level: Not on file  Occupational History  . Occupation: Retired  Scientific laboratory technician  . Financial resource strain: Not very hard  . Food insecurity:    Worry: Never true    Inability: Never true  . Transportation needs:    Medical: No    Non-medical: No  Tobacco Use  . Smoking status: Current Some Day Smoker    Packs/day: 1.00    Years: 45.00    Pack years: 45.00    Types: Cigarettes    Last attempt to quit: 04/16/2018    Years since quitting: 0.2  . Smokeless tobacco: Never Used  Substance and Sexual Activity  . Alcohol use: No    Alcohol/week: 0.0 standard drinks  . Drug use: No  . Sexual activity: Not on file  Lifestyle  . Physical activity:    Days per week: Not on file    Minutes per session: Not on file  . Stress: Not on file  Relationships  . Social connections:    Talks on phone: Not on file    Gets together: Not on file    Attends religious service: Not on file    Active member of club or organization: Not on file    Attends meetings of clubs or organizations: Not on file    Relationship status: Not on file  . Intimate partner violence:    Fear of current or ex partner: Not on file    Emotionally abused: Not on file    Physically abused: Not on file    Forced sexual activity: Not on file  Other Topics Concern  . Not on file  Social History Narrative  . Not on file   Health Maintenance  Topic Date Due  . DEXA SCAN  01/07/2007  . TETANUS/TDAP  07/17/2012  . HEMOGLOBIN A1C  04/12/2016  . FOOT EXAM  07/06/2016  . INFLUENZA VACCINE  02/14/2018  . OPHTHALMOLOGY EXAM  08/17/2018 (Originally 08/09/2016)  . PNA vac Low Risk Adult  Completed    The  following portions of the patient's history were reviewed and updated as appropriate:  She  has a past medical history of Arthritis, CHF (congestive heart failure) (HCC), Chronic lower back pain, COPD (chronic obstructive pulmonary disease) ([place]), DVT (deep venous thrombosis) ([place]), History of blood transfusion (1990), History of kidney stones, Hyperlipidemia, Hypertension, OSA on CPAP, Persistent atrial fibrillation, Pneumonia, and Type II diabetes mellitus ([place]). She does not have any pertinent problems on file. She  has a past surgical history that includes Tubal ligation; Cystectomy; Ganglion cyst excision (Left); TEE without cardioversion (N/A, 10/31/2013); Colonoscopy; Cardioversion (N/A, 08/11/2016); ATRIAL FIBRILLATION ABLATION (N/A, 11/07/2016); Knee cartilage surgery (Right, 1960s); Lumbar disc surgery (1990); Repair iliac artery (1990); Colon surgery (1990); Back surgery; Cataract extraction w/ intraocular lens  implant, bilateral (Bilateral); Cardiac catheterization; Cardioversion (N/A, 12/03/2017); and Cardioversion (N/A, 05/02/2018). Her family history includes Diabetes in an other family member; Factor V Leiden deficiency in her daughter; Melanoma in an other family member. She  reports that she has been smoking cigarettes. She has a 45.00 pack-year smoking history. She has never used smokeless tobacco. She reports that  she does not drink alcohol or use drugs. She has a current medication list which includes the following prescription(s): acetaminophen, dofetilide, eliquis, eplerenone, fenofibrate, furosemide, glimepiride, insulin glargine, metformin, metoprolol succinate, potassium chloride, rosuvastatin, and sacubitril-valsartan. Current Outpatient Medications on File Prior to Visit  Medication Sig Dispense Refill  . acetaminophen (TYLENOL) 650 MG CR tablet Take 650 mg by mouth every 8 (eight) hours as needed for pain.    Marland Kitchen dofetilide (TIKOSYN) 125 MCG capsule Take 1 capsule (125 mcg total) by  mouth 2 (two) times daily. 30 capsule 0  . ELIQUIS 5 MG TABS tablet TAKE 1 TABLET BY MOUTH TWICE A DAY (Patient taking differently: Take 5 mg by mouth 2 (two) times daily. ) 180 tablet 1  . eplerenone (INSPRA) 25 MG tablet TAKE 1 TABLET BY MOUTH EVERY DAY (Patient taking differently: Take 25 mg by mouth daily. ) 90 tablet 3  . fenofibrate 160 MG tablet Take 1 tablet (160 mg total) by mouth daily. 90 tablet 1  . furosemide (LASIX) 40 MG tablet Take 1.5 tablets (60 mg total) by mouth 2 (two) times daily. 270 tablet 3  . glimepiride (AMARYL) 2 MG tablet Take 2-4 mg by mouth See admin instructions. Take 2 tablets (4 mg) by mouth every morning and take 1 tablet (2 mg) by mouth every evening.    . insulin glargine (LANTUS) 100 UNIT/ML injection Inject 10 Units into the skin at bedtime.    . metFORMIN (GLUCOPHAGE) 1000 MG tablet TAKE 1 TABLET BY MOUTH TWICE A DAY *NEED OFFICE VISIT* 60 tablet 0  . metoprolol succinate (TOPROL-XL) 25 MG 24 hr tablet TAKE 1 TABLET BY MOUTH TWICE A DAY 180 tablet 2  . potassium chloride (KLOR-CON M10) 10 MEQ tablet Take 3 tablets (30 mEq total) by mouth 2 (two) times daily. 180 tablet 3  . rosuvastatin (CRESTOR) 10 MG tablet Take 10 mg by mouth daily.    . sacubitril-valsartan (ENTRESTO) 49-51 MG Take 1 tablet by mouth 2 (two) times daily. 60 tablet 11   No current facility-administered medications on file prior to visit.    She is allergic to neomycin-bacitracin zn-polymyx; sulfonamide derivatives; ciprofloxacin; and spironolactone..  Review of Systems Review of Systems  Constitutional: Negative for activity change, appetite change and fatigue.  HENT: Negative for hearing loss, congestion, tinnitus and ear discharge.  dentist q84m Eyes: Negative for visual disturbance (see optho q1y -- vision corrected to 20/20 with glasses).  Respiratory: Negative for cough, chest tightness and shortness of breath.   Cardiovascular: Negative for chest pain, palpitations and leg  swelling.  Gastrointestinal: Negative for abdominal pain, diarrhea, constipation and abdominal distention.  Genitourinary: Negative for urgency, frequency, decreased urine volume and difficulty urinating.  Musculoskeletal: Negative for back pain, arthralgias and gait problem.  Skin: Negative for color change, pallor and rash.  Neurological: Negative for dizziness, light-headedness, numbness and headaches.  Hematological: Negative for adenopathy. Does not bruise/bleed easily.  Psychiatric/Behavioral: Negative for suicidal ideas, confusion, sleep disturbance, self-injury, dysphoric mood, decreased concentration and agitation.       Objective:    BP 110/60 (BP Location: Left Arm, Cuff Size: Large)   Pulse 65   Temp 97.8 F (36.6 C) (Oral)   Resp 16   Ht 5\' 6"  (1.676 m)   Wt 267 lb 3.2 oz (121.2 kg)   SpO2 93%   BMI 43.13 kg/m  General appearance: alert, cooperative, appears stated age and no distress Head: Normocephalic, without obvious abnormality, atraumatic Eyes: negative findings: lids and lashes normal,  conjunctivae and sclerae normal and pupils equal, round, reactive to light and accomodation Ears: normal TM's and external ear canals both ears Nose: Nares normal. Septum midline. Mucosa normal. No drainage or sinus tenderness. Throat: lips, mucosa, and tongue normal; teeth and gums normal Neck: no adenopathy, no carotid bruit, no JVD, supple, symmetrical, trachea midline and thyroid not enlarged, symmetric, no tenderness/mass/nodules Back: symmetric, no curvature. ROM normal. No CVA tenderness. Lungs: clear to auscultation bilaterally Breasts: normal appearance, no masses or tenderness Heart: regular rate and rhythm, S1, S2 normal, no murmur, click, rub or gallop Abdomen: soft, non-tender; bowel sounds normal; no masses,  no organomegaly Pelvic: not indicated; post-menopausal, no abnormal Pap smears in past Extremities: extremities normal, atraumatic, no cyanosis or  edema Pulses: 2+ and symmetric Skin: Skin color, texture, turgor normal. No rashes or lesions Lymph nodes: Cervical, supraclavicular, and axillary nodes normal. Neurologic: Alert and oriented X 3, normal strength and tone. Normal symmetric reflexes. Normal coordination and gait    Assessment:    Healthy female exam.     Plan:  Check labs   ghm utd-- pt refuses bmd and mamm See After Visit Summary for Counseling Recommendations   Td given in office due to abscess on R arm   1. Morbid obesity ([place])  - Amb Ref to Medical Weight Management  2. DM (diabetes mellitus) type II uncontrolled, periph vascular disorder (HCC) Check labs  - Comprehensive metabolic panel - Hemoglobin A1c  3. Paroxysmal atrial fibrillation (HCC) stable  4. Essential hypertension Well controlled, no changes to meds. Encouraged heart healthy diet such as the DASH diet and exercise as tolerated.  - CBC with Differential/Platelet - Comprehensive metabolic panel - Hemoglobin A1c - Lipid panel - TSH  5. Hyperlipidemia LDL goal <100 Encouraged heart healthy diet, increase exercise, avoid trans fats, consider a krill oil cap daily - CBC with Differential/Platelet - Comprehensive metabolic panel - Hemoglobin A1c - Lipid panel - TSH  6. Abscess of right arm R axilla - doxycycline (VIBRA-TABS) 100 MG tablet; Take 1 tablet (100 mg total) by mouth 2 (two) times daily.  Dispense: 20 tablet; Refill: 0

## 2018-06-29 ENCOUNTER — Other Ambulatory Visit: Payer: Self-pay | Admitting: Family Medicine

## 2018-06-29 DIAGNOSIS — E1151 Type 2 diabetes mellitus with diabetic peripheral angiopathy without gangrene: Secondary | ICD-10-CM

## 2018-06-29 LAB — LIPID PANEL
Cholesterol: 144 mg/dL (ref <200)
HDL: 28 mg/dL — AB (ref >50)
LDL Cholesterol (Calc): 77 mg/dL (calc)
Non-HDL Cholesterol (Calc): 116 mg/dL (calc) (ref <130)
Total CHOL/HDL Ratio: 5.1 (calc) — ABNORMAL HIGH (ref <5.0)
Triglycerides: 322 mg/dL — ABNORMAL HIGH (ref <150)

## 2018-06-29 LAB — COMPREHENSIVE METABOLIC PANEL
AG Ratio: 1.3 (calc) (ref 1.0–2.5)
ALT: 18 U/L (ref 6–29)
AST: 21 U/L (ref 10–35)
Albumin: 3.9 g/dL (ref 3.6–5.1)
Alkaline phosphatase (APISO): 43 U/L (ref 33–130)
BUN: 16 mg/dL (ref 7–25)
CO2: 29 mmol/L (ref 20–32)
Calcium: 9.1 mg/dL (ref 8.6–10.4)
Chloride: 100 mmol/L (ref 98–110)
Creat: 0.85 mg/dL (ref 0.60–0.93)
Globulin: 3.1 g/dL (calc) (ref 1.9–3.7)
Glucose, Bld: 92 mg/dL (ref 65–99)
Potassium: 4.2 mmol/L (ref 3.5–5.3)
Sodium: 140 mmol/L (ref 135–146)
Total Bilirubin: 0.3 mg/dL (ref 0.2–1.2)
Total Protein: 7 g/dL (ref 6.1–8.1)

## 2018-06-29 LAB — CBC WITH DIFFERENTIAL/PLATELET
Basophils Absolute: 38 cells/uL (ref 0–200)
Basophils Relative: 0.4 %
Eosinophils Absolute: 154 cells/uL (ref 15–500)
Eosinophils Relative: 1.6 %
HCT: 45.4 % — ABNORMAL HIGH (ref 35.0–45.0)
Hemoglobin: 15.2 g/dL (ref 11.7–15.5)
Lymphs Abs: 3658 cells/uL (ref 850–3900)
MCH: 30.6 pg (ref 27.0–33.0)
MCHC: 33.5 g/dL (ref 32.0–36.0)
MCV: 91.3 fL (ref 80.0–100.0)
MPV: 11.2 fL (ref 7.5–12.5)
Monocytes Relative: 10.7 %
Neutro Abs: 4723 cells/uL (ref 1500–7800)
Neutrophils Relative %: 49.2 %
Platelets: 303 10*3/uL (ref 140–400)
RBC: 4.97 10*6/uL (ref 3.80–5.10)
RDW: 13.3 % (ref 11.0–15.0)
TOTAL LYMPHOCYTE: 38.1 %
WBC mixed population: 1027 cells/uL — ABNORMAL HIGH (ref 200–950)
WBC: 9.6 10*3/uL (ref 3.8–10.8)

## 2018-06-29 LAB — TSH: TSH: 3.03 mIU/L (ref 0.40–4.50)

## 2018-06-29 LAB — HEMOGLOBIN A1C
Hgb A1c MFr Bld: 8 % of total Hgb — ABNORMAL HIGH (ref <5.7)
MEAN PLASMA GLUCOSE: 183 (calc)
eAG (mmol/L): 10.1 (calc)

## 2018-06-30 ENCOUNTER — Encounter: Payer: Self-pay | Admitting: Family Medicine

## 2018-07-04 DIAGNOSIS — IMO0002 Reserved for concepts with insufficient information to code with codable children: Secondary | ICD-10-CM

## 2018-07-04 DIAGNOSIS — E1165 Type 2 diabetes mellitus with hyperglycemia: Principal | ICD-10-CM

## 2018-07-04 DIAGNOSIS — E1151 Type 2 diabetes mellitus with diabetic peripheral angiopathy without gangrene: Secondary | ICD-10-CM

## 2018-07-20 ENCOUNTER — Other Ambulatory Visit (HOSPITAL_COMMUNITY): Payer: Self-pay | Admitting: Nurse Practitioner

## 2018-07-20 DIAGNOSIS — E876 Hypokalemia: Secondary | ICD-10-CM

## 2018-07-31 ENCOUNTER — Other Ambulatory Visit: Payer: Self-pay

## 2018-07-31 ENCOUNTER — Ambulatory Visit (HOSPITAL_COMMUNITY)
Admission: RE | Admit: 2018-07-31 | Discharge: 2018-07-31 | Disposition: A | Discharge status: Home / Self Care | Payer: Medicare Other | Source: Ambulatory Visit | Attending: Cardiology | Admitting: Cardiology

## 2018-07-31 ENCOUNTER — Encounter (HOSPITAL_COMMUNITY): Payer: Self-pay | Admitting: Cardiology

## 2018-07-31 VITALS — BP 117/70 | HR 81 | Wt 266.0 lb

## 2018-07-31 DIAGNOSIS — E781 Pure hyperglyceridemia: Secondary | ICD-10-CM | POA: Insufficient documentation

## 2018-07-31 DIAGNOSIS — Z7901 Long term (current) use of anticoagulants: Secondary | ICD-10-CM | POA: Diagnosis not present

## 2018-07-31 DIAGNOSIS — I5022 Chronic systolic (congestive) heart failure: Secondary | ICD-10-CM | POA: Diagnosis not present

## 2018-07-31 DIAGNOSIS — Z79899 Other long term (current) drug therapy: Secondary | ICD-10-CM | POA: Diagnosis not present

## 2018-07-31 DIAGNOSIS — Z794 Long term (current) use of insulin: Secondary | ICD-10-CM | POA: Diagnosis not present

## 2018-07-31 DIAGNOSIS — G4733 Obstructive sleep apnea (adult) (pediatric): Secondary | ICD-10-CM | POA: Diagnosis not present

## 2018-07-31 DIAGNOSIS — I4891 Unspecified atrial fibrillation: Secondary | ICD-10-CM | POA: Diagnosis not present

## 2018-07-31 DIAGNOSIS — I5042 Chronic combined systolic (congestive) and diastolic (congestive) heart failure: Secondary | ICD-10-CM | POA: Insufficient documentation

## 2018-07-31 DIAGNOSIS — E119 Type 2 diabetes mellitus without complications: Secondary | ICD-10-CM | POA: Insufficient documentation

## 2018-07-31 DIAGNOSIS — I350 Nonrheumatic aortic (valve) stenosis: Secondary | ICD-10-CM | POA: Diagnosis not present

## 2018-07-31 DIAGNOSIS — Z87891 Personal history of nicotine dependence: Secondary | ICD-10-CM | POA: Insufficient documentation

## 2018-07-31 DIAGNOSIS — E785 Hyperlipidemia, unspecified: Secondary | ICD-10-CM | POA: Diagnosis not present

## 2018-07-31 DIAGNOSIS — J449 Chronic obstructive pulmonary disease, unspecified: Secondary | ICD-10-CM | POA: Diagnosis not present

## 2018-07-31 DIAGNOSIS — I2510 Atherosclerotic heart disease of native coronary artery without angina pectoris: Secondary | ICD-10-CM | POA: Diagnosis not present

## 2018-07-31 DIAGNOSIS — I110 Hypertensive heart disease with heart failure: Secondary | ICD-10-CM | POA: Diagnosis not present

## 2018-07-31 DIAGNOSIS — I4892 Unspecified atrial flutter: Secondary | ICD-10-CM | POA: Insufficient documentation

## 2018-07-31 DIAGNOSIS — I480 Paroxysmal atrial fibrillation: Secondary | ICD-10-CM | POA: Insufficient documentation

## 2018-07-31 MED ORDER — ICOSAPENT ETHYL 1 G PO CAPS: 2.0000 g | ORAL_CAPSULE | Freq: Two times a day (BID) | ORAL | 6 refills | Status: DC

## 2018-07-31 NOTE — Patient Instructions (Signed)
BEGIN taking Vascepa 2g (2 tabs) TWICE A DAY.  Your physician recommends that you schedule a follow-up appointment in 4 months.

## 2018-08-01 NOTE — Progress Notes (Signed)
Patient ID: Heidi Stark, female   DOB: [birth year], 78 y.o.   MRN: [id number] PCP: Dr. [name] Cardiology: Dr. [name]  78 y.o. with history of HTN, DM, hyperlipidemia presents for cardiology followup.  She was admitted in 4/15 with splenic and renal infarcts.  She was found to have paroxysmal atrial fibrillation.  In 4/15, she had fever and flank pain.  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  TEE showed prominent aortic plaque.  While she was in the hospital, she was noted to have a brief run of atrial fibrillation and was started on coumadin.    She has left leg pain with ambulation after about 100 feet, LE dopplers 5/15 showed occluded left external iliac artery.  This has been present ever since she had left iliac damage with a prior back surgery.    She was stable until 1/18, when she developed palpitations and exertional dyspnea.  She was [facility] to [facility] with atrial fibrillation/RVR and acute systolic CHF.  EF was found to be 30-35%, down from 50-55% in 2015.  She was diuresed and underwent DCCV back to NSR.  She subsequently had atrial fibrillation ablation in 4/18.  She went back into atrial fibrillation in 5/19 and had DCCV back today NSR.   Echo was done 6/19 and reviewed, EF 55%, mild LVH, moderate diastolic dysfunction, normal RV size and systolic function.   With recurrent atrial fibrillation, she was admitted in 10/19 to start Tikosyn.  She was cardioverted back to NSR.   She returns for followup of atrial fibrillation and diastolic CHF.  She is in NSR today. No chest pain.  Not very active, but no significant exertional dyspnea.  No orthopnea/PND.  No palpitations. She is using CPAP. She has been referred to the weight management clinic. Weight is down 5 lbs.   Labs (4/15): K 4.1, creatinine 0.7, HCT 46.2 Labs (7/15): K 3.9, creatinine 0.8, HCT 50 Labs (12/15): K 5, creatinine 0.7, HDL 33, LDL 57 Labs (5/16): K 4, creatinine 0.84, TGs 256,  HDL 29, LDL 42, HCT 41.2 Labs (1/18): K 3.4, creatinine 0.78 Labs (2/18): K 4.4, creatinine 0.8, free T3/T4 normal, mildly elevated TSH, LFTs normal, hgb 15.6 Labs (4/18): K 4.2, creatinine 0.86, TSH elevated, free T3 and free T4 normal Labs (5/19): K 3.9, creatinine 0.78 Labs (6/19): K 4.4, creatinine 0.88 Labs (12/19): K 4.2, creatinine 0.85, TGs 322, LDL 77  ECG (personally reviewed): NSR, QTc 470  PMH: 1.Type II diabetes 2. HTN 3. Hyperlipidemia 4. COPD: has quit smoking.  5. CAD: Coronary CTA (4/18) with nonobstructive coronary disease, calcium score 335 Agatston units (82nd percentile).  6. Paroxysmal atrial fibrillation: First noted in 4/15.  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   - Event monitor (4/15) with no atrial fibrillation.  - Admission with atrial fibrillation/RVR in 1/18.  - Atrial fibrillation ablation in 4/18.  - DCCV in 5/19.  - Tikosyn started + DCCV in 10/19.  7. Chronic systolic CHF: ?tachycardia-mediated as first noted in setting of atrial fibrillation with RVR.  - TEE (4/15) with EF 50-55%, mild LVH, grade III-IV plaque in descending thoracic aorta, RV normal, no PFO, peak RV-RA gradient 42 mmHg.   - Echo (1/18) with EF 30-35%, diffuse hypokinesis, moderate LVH, mildly dilated RV, PASP 55 mmHg.  - Echo (4/18) with EF 50%, anterolateral/inferolateral mild hypokinesis, mild aortic stenosis, IVC normal.  - Echo (6/19) with EF 55%, mild LVH,  moderate diastolic dysfunction, normal RV size and systolic function.  7. H/o diskectomy. 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 Lower extremity arterial dopplers (5/15) with occluded left external iliac artery.  - ABIs (5/18): 1.16 (normal) on right, 0.56 on left.  9. Aortic stenosis: Mild on 4/18 echo.  10. OSA: Uses CPAP  SH: Married, 2 kids, quit smoking in 2019.   FH: Daughter with factor V Leiden deficiency.  Brother with CABG.  Sister with aneurysm.    ROS: All systems reviewed and negative except as per HPI.   Current Outpatient Medications  Medication Sig Dispense Refill  . acetaminophen (TYLENOL) 650 MG CR tablet Take 650 mg by mouth every 8 (eight) hours as needed for pain.    Marland Kitchen dofetilide (TIKOSYN) 125 MCG capsule Take 1 capsule (125 mcg total) by mouth 2 (two) times daily. 30 capsule 0  . doxycycline (VIBRA-TABS) 100 MG tablet Take 1 tablet (100 mg total) by mouth 2 (two) times daily. 20 tablet 0  . ELIQUIS 5 MG TABS tablet TAKE 1 TABLET BY MOUTH TWICE A DAY 180 tablet 1  . eplerenone (INSPRA) 25 MG tablet TAKE 1 TABLET BY MOUTH EVERY DAY 90 tablet 3  . fenofibrate 160 MG tablet Take 1 tablet (160 mg total) by mouth daily. 90 tablet 1  . furosemide (LASIX) 40 MG tablet Take 1.5 tablets (60 mg total) by mouth 2 (two) times daily. 270 tablet 3  . glimepiride (AMARYL) 2 MG tablet Take 2-4 mg by mouth See admin instructions. Take 2 tablets (4 mg) by mouth every morning and take 1 tablet (2 mg) by mouth every evening.    . insulin glargine (LANTUS) 100 UNIT/ML injection Inject 10 Units into the skin at bedtime.    Marland Kitchen KLOR-CON M10 10 MEQ tablet TAKE 3 TABLETS BY MOUTH TWICE A DAY 540 tablet 1  . metFORMIN (GLUCOPHAGE) 1000 MG tablet Take 0.5 tablets (500 mg total) by mouth 2 (two) times daily. 180 tablet 1  . metoprolol succinate (TOPROL-XL) 25 MG 24 hr tablet TAKE 1 TABLET BY MOUTH TWICE A DAY 180 tablet 2  . rosuvastatin (CRESTOR) 10 MG tablet Take 10 mg by mouth daily.    . sacubitril-valsartan (ENTRESTO) 49-51 MG Take 1 tablet by mouth 2 (two) times daily. 60 tablet 11  . Icosapent Ethyl (VASCEPA) 1 g CAPS Take 2 capsules (2 g total) by mouth 2 (two) times daily. 65 capsule 6   No current facility-administered medications for this encounter.     BP 117/70   Pulse 81   Wt 120.7 kg (266 lb)   SpO2 95%   BMI 42.93 kg/m  General: NAD Neck: No JVD, no thyromegaly or thyroid nodule.  Lungs: Clear to auscultation bilaterally with  normal respiratory effort. CV: Nondisplaced PMI.  Heart regular S1/S2, no S3/S4, no murmur.  No peripheral edema.  No carotid bruit.  Difficult to palpate pedal pulses.  Abdomen: Soft, nontender, no hepatosplenomegaly, no distention.  Skin: Intact without lesions or rashes.  Neurologic: Alert and oriented x 3.  Psych: Normal affect. Extremities: No clubbing or cyanosis.  HEENT: Normal.   Assessment/Plan: 1. Atrial fibrillation/flutter: Paroxysmal.  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 She was admitted in 1/18 with atrial fibrillation/RVR and CHF.  She was diuresed and had DCCV back to NSR.  She had atrial fibrillation ablation in 4/18.  In 5/19, she went into atrial fibrillation transiently.  In 10/19, she was back in  atrial fibrillation and was admitted for Tikosyn initiation and cardioversion.  Today, she is in NSR and QTc is ok.  - Continue Eliquis.    - Continue Tikosyn.  2. Claudication: Left leg claudication with absent left PT pulse.  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   3. Chronic systolic => diastolic CHF: EF 48-18% on echo in 1/18, down from 50-55% in 4/15.  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  NYHA class II symptoms, she is not volume overloaded.  - Continue Lasix 60 mg bid.    - Continue Toprol XL 25 mg bid.  - Continue Entresto 49/51 bid  - ?rash from spironolactone, now on eplerenone 25 mg daily.    4. CAD: Coronary CTA showed coronary calcium score in the 82nd percentile with nonobstructive coronary disease.  - not on ASA 81 as she is taking Eliqius.  - Continue Crestor.   5. COPD: She is no longer smoking.  6. Hyperlipidemia: LDL at goal on Crestor.  Triglycerides elevated.   - Start Vascepa 2 g bid with high triglycerides.     Followup in 4 months.     [name] 08/01/2018

## 2018-08-06 ENCOUNTER — Encounter: Payer: Self-pay | Admitting: Internal Medicine

## 2018-08-13 ENCOUNTER — Encounter: Payer: Self-pay | Admitting: *Deleted

## 2018-08-27 ENCOUNTER — Telehealth (HOSPITAL_COMMUNITY): Payer: Self-pay | Admitting: Surgery

## 2018-08-27 DIAGNOSIS — Z794 Long term (current) use of insulin: Secondary | ICD-10-CM

## 2018-08-27 DIAGNOSIS — E1165 Type 2 diabetes mellitus with hyperglycemia: Secondary | ICD-10-CM

## 2018-08-27 DIAGNOSIS — E876 Hypokalemia: Secondary | ICD-10-CM

## 2018-08-27 NOTE — Telephone Encounter (Signed)
Patient called concerned that she was  added Invokana medication and has recently been "peeing a lot".  She called to ask Dr. [name] if she should lower her diuretic dosage due to increased urination frequency.  Message sent to Dr [name].

## 2018-08-27 NOTE — Telephone Encounter (Signed)
I spoke with Heidi Stark to request that she come in for lab work as requested by Dr. [name].  She plans to come in on Thursday Feb 13th at 10.

## 2018-08-27 NOTE — Telephone Encounter (Signed)
Let's get a BMET on her to make sure renal function is stable first.

## 2018-08-29 ENCOUNTER — Ambulatory Visit (HOSPITAL_COMMUNITY)
Admission: RE | Admit: 2018-08-29 | Discharge: 2018-08-29 | Disposition: A | Discharge status: Home / Self Care | Payer: Medicare Other | Source: Ambulatory Visit | Attending: Cardiology | Admitting: Cardiology

## 2018-08-29 DIAGNOSIS — E1165 Type 2 diabetes mellitus with hyperglycemia: Secondary | ICD-10-CM | POA: Insufficient documentation

## 2018-08-29 DIAGNOSIS — Z794 Long term (current) use of insulin: Secondary | ICD-10-CM | POA: Diagnosis not present

## 2018-08-29 DIAGNOSIS — E876 Hypokalemia: Secondary | ICD-10-CM | POA: Insufficient documentation

## 2018-08-29 LAB — BASIC METABOLIC PANEL
Anion gap: 10 (ref 5–15)
BUN: 16 mg/dL (ref 8–23)
CO2: 25 mmol/L (ref 22–32)
Calcium: 8.8 mg/dL — ABNORMAL LOW (ref 8.9–10.3)
Chloride: 105 mmol/L (ref 98–111)
Creatinine, Ser: 1.14 mg/dL — ABNORMAL HIGH (ref 0.44–1.00)
GFR calc Af Amer: 54 mL/min — ABNORMAL LOW (ref >60)
GFR calc non Af Amer: 47 mL/min — ABNORMAL LOW (ref >60)
Glucose, Bld: 142 mg/dL — ABNORMAL HIGH (ref 70–99)
POTASSIUM: 5.5 mmol/L — AB (ref 3.5–5.1)
Sodium: 140 mmol/L (ref 135–145)

## 2018-09-02 ENCOUNTER — Ambulatory Visit (HOSPITAL_COMMUNITY)
Admission: RE | Admit: 2018-09-02 | Discharge: 2018-09-02 | Disposition: A | Discharge status: Home / Self Care | Payer: Medicare Other | Source: Ambulatory Visit | Attending: Internal Medicine | Admitting: Internal Medicine

## 2018-09-02 DIAGNOSIS — I739 Peripheral vascular disease, unspecified: Secondary | ICD-10-CM | POA: Insufficient documentation

## 2018-09-02 LAB — BASIC METABOLIC PANEL
Anion gap: 17 — ABNORMAL HIGH (ref 5–15)
BUN: 18 mg/dL (ref 8–23)
CHLORIDE: 95 mmol/L — AB (ref 98–111)
CO2: 26 mmol/L (ref 22–32)
Calcium: 8.4 mg/dL — ABNORMAL LOW (ref 8.9–10.3)
Creatinine, Ser: 1.06 mg/dL — ABNORMAL HIGH (ref 0.44–1.00)
GFR calc non Af Amer: 51 mL/min — ABNORMAL LOW (ref >60)
GFR, EST AFRICAN AMERICAN: 59 mL/min — AB (ref >60)
Glucose, Bld: 272 mg/dL — ABNORMAL HIGH (ref 70–99)
Potassium: 4.1 mmol/L (ref 3.5–5.1)
Sodium: 138 mmol/L (ref 135–145)

## 2018-09-03 ENCOUNTER — Telehealth: Payer: Self-pay | Admitting: Cardiology

## 2018-09-03 NOTE — Telephone Encounter (Signed)
Pt inquiring as to why she needs to see both Heidi Stark and Heidi Stark.   Pt and I discussed rationale and pt understands difference.  Informed pt that b/c she is on Tikosyn she may need to stay w/ Heidi Stark unless Dr. Aundra Stark is ok with monitoring/dosing. Pt is not opposed to seeing both doctors but wants them spread out and not in the same month. She currently sees Heidi Stark in May and has a recall to see Heidi Stark also. She understands I will discuss more w/ Dr. Curt Stark and let her know in next several weeks/next month recommendation.  I explained that if both MDs are needed, then we can work some arrangement out b/t both doctors so that they are spread out and not within same month/two. Patient verbalized understanding and agreeable to plan.  Will also forward to Dr. Aundra Stark for his input/recommendation.

## 2018-09-03 NOTE — Telephone Encounter (Signed)
I'm happy to see her and follow ECG etc on Tikosyn and can send her back to [facility] if it looks like she needs redo ablation.

## 2018-09-03 NOTE — Telephone Encounter (Signed)
New Message:    Pt says she would like to talk to Dr Macky Lower  Nurse. She said it was an appointment, but I could not help her with it, she wanted to talk to the nurse.

## 2018-09-04 ENCOUNTER — Other Ambulatory Visit (HOSPITAL_COMMUNITY): Payer: Self-pay | Admitting: Cardiology

## 2018-09-05 ENCOUNTER — Encounter (INDEPENDENT_AMBULATORY_CARE_PROVIDER_SITE_OTHER): Payer: Self-pay

## 2018-09-09 ENCOUNTER — Ambulatory Visit (INDEPENDENT_AMBULATORY_CARE_PROVIDER_SITE_OTHER): Payer: Medicare Other | Admitting: Family Medicine

## 2018-09-09 ENCOUNTER — Encounter (INDEPENDENT_AMBULATORY_CARE_PROVIDER_SITE_OTHER): Payer: Self-pay | Admitting: Family Medicine

## 2018-09-09 VITALS — BP 111/69 | HR 92 | Temp 98.1°F | Ht 64.0 in | Wt 259.0 lb

## 2018-09-09 DIAGNOSIS — I10 Essential (primary) hypertension: Secondary | ICD-10-CM

## 2018-09-09 DIAGNOSIS — R5383 Other fatigue: Secondary | ICD-10-CM | POA: Diagnosis not present

## 2018-09-09 DIAGNOSIS — E119 Type 2 diabetes mellitus without complications: Secondary | ICD-10-CM | POA: Diagnosis not present

## 2018-09-09 DIAGNOSIS — R0602 Shortness of breath: Secondary | ICD-10-CM | POA: Diagnosis not present

## 2018-09-09 DIAGNOSIS — Z1331 Encounter for screening for depression: Secondary | ICD-10-CM | POA: Diagnosis not present

## 2018-09-09 DIAGNOSIS — Z6841 Body Mass Index (BMI) 40.0 and over, adult: Secondary | ICD-10-CM | POA: Diagnosis not present

## 2018-09-09 DIAGNOSIS — Z0289 Encounter for other administrative examinations: Secondary | ICD-10-CM

## 2018-09-09 DIAGNOSIS — I4891 Unspecified atrial fibrillation: Secondary | ICD-10-CM

## 2018-09-09 DIAGNOSIS — E538 Deficiency of other specified B group vitamins: Secondary | ICD-10-CM

## 2018-09-09 DIAGNOSIS — E559 Vitamin D deficiency, unspecified: Secondary | ICD-10-CM

## 2018-09-09 DIAGNOSIS — Z794 Long term (current) use of insulin: Secondary | ICD-10-CM

## 2018-09-10 LAB — T4, FREE: Free T4: 1.3 ng/dL (ref 0.82–1.77)

## 2018-09-10 LAB — COMPREHENSIVE METABOLIC PANEL
ALK PHOS: 47 IU/L (ref 39–117)
ALT: 13 IU/L (ref 0–32)
AST: 20 IU/L (ref 0–40)
Albumin/Globulin Ratio: 1.4 (ref 1.2–2.2)
Albumin: 4.2 g/dL (ref 3.7–4.7)
BUN/Creatinine Ratio: 17 (ref 12–28)
BUN: 16 mg/dL (ref 8–27)
Bilirubin Total: 0.3 mg/dL (ref 0.0–1.2)
CO2: 24 mmol/L (ref 20–29)
CREATININE: 0.93 mg/dL (ref 0.57–1.00)
Calcium: 9.2 mg/dL (ref 8.7–10.3)
Chloride: 99 mmol/L (ref 96–106)
GFR calc Af Amer: 69 mL/min/{1.73_m2} (ref >59)
GFR calc non Af Amer: 60 mL/min/{1.73_m2} (ref >59)
Globulin, Total: 2.9 g/dL (ref 1.5–4.5)
Glucose: 179 mg/dL — ABNORMAL HIGH (ref 65–99)
Potassium: 5.4 mmol/L — ABNORMAL HIGH (ref 3.5–5.2)
Sodium: 142 mmol/L (ref 134–144)
Total Protein: 7.1 g/dL (ref 6.0–8.5)

## 2018-09-10 LAB — LIPID PANEL WITH LDL/HDL RATIO
Cholesterol, Total: 150 mg/dL (ref 100–199)
HDL: 32 mg/dL — ABNORMAL LOW (ref >39)
LDL Calculated: 46 mg/dL (ref 0–99)
LDl/HDL Ratio: 1.4 ratio (ref 0.0–3.2)
Triglycerides: 362 mg/dL — ABNORMAL HIGH (ref 0–149)
VLDL Cholesterol Cal: 72 mg/dL — ABNORMAL HIGH (ref 5–40)

## 2018-09-10 LAB — VITAMIN B12: Vitamin B-12: 325 pg/mL (ref 232–1245)

## 2018-09-10 LAB — T3: T3, Total: 136 ng/dL (ref 71–180)

## 2018-09-10 LAB — PTH, INTACT AND CALCIUM: PTH: 47 pg/mL (ref 15–65)

## 2018-09-10 LAB — VITAMIN D 25 HYDROXY (VIT D DEFICIENCY, FRACTURES): Vit D, 25-Hydroxy: 4.3 ng/mL — ABNORMAL LOW (ref 30.0–100.0)

## 2018-09-10 LAB — TSH: TSH: 1.74 u[IU]/mL (ref 0.450–4.500)

## 2018-09-10 NOTE — Progress Notes (Signed)
.  Office: [phone]  /  Fax: [phone]   HPI:   Chief Complaint: OBESITY  Heidi Stark Stark (MR# [id number]) is a 78 y.o. female who presents on 09/09/2018 for obesity evaluation and treatment. Current BMI is Body mass index is 44.46 kg/m.  Heidi Stark Stark has struggled with obesity for years and has been unsuccessful in either losing weight or maintaining long term weight loss. Heidi Stark Stark attended our information session and states she is currently in the action stage of change and ready to dedicate time achieving and maintaining a healthier weight.   Heidi Stark Stark states her family eats meals together she thinks her family will eat healthier with her her desired weight loss is 99 lbs she has been heavy most of her life she started gaining weight after child births her heaviest weight ever was 300 lbs. she skips meals frequently she has binge eating behaviors   Heidi Stark Stark feels her energy is lower than it should be. This has worsened with weight gain and has not worsened recently. Heidi Stark Stark admits to daytime somnolence and admits to waking up still tired. Patient is at risk for obstructive sleep apnea. Patent has a history of symptoms of morning Heidi Stark, Epworth sleepiness scale, and hypertension. Patient generally gets 6 hours of sleep per night, and states they generally have restless sleep. Snoring is present. Apneic episodes are present. Epworth Sleepiness Score is 13.  Dyspnea on exertion Heidi Stark Stark notes increasing shortness of breath with exercising and seems to be worsening over time with weight gain. She notes getting out of breath sooner with activity than she used to. This has not gotten worse recently. Heidi Stark Stark denies orthopnea.  Diabetes II Heidi Stark Stark has a diagnosis of diabetes type II. Heidi Stark Stark states that she only checks her blood sugar in the mornings and she does not have her log today. Her last A1c was 8.0 on 06/28/18 and she is on Invokamet and insulin.  Atrial Fibrillation Heidi Stark Stark's rate is controlled  right now, but she had some premature ventricular contractions on her EKG. Heidi Stark Stark is followed by cardiology.  Hypertension Heidi Stark Stark is a 78 y.o. female with hypertension. Heidi Stark Stark's blood pressure is currently well controlled today. She is working on weight loss to help control her blood pressure with the goal of decreasing her risk of heart attack and stroke. Heidi Stark Stark admits shortness of breath and denies chest pain.  Vitamin D Deficiency Heidi Stark Stark has a diagnosis of vitamin D deficiency. She is not currently taking vit D and admits Heidi Stark. Her most recent calcium level was decreased at 8.4 on 09/02/18.  Vitamin B12 Deficiency Heidi Stark Stark has a diagnosis of B12 insufficiency and notes Heidi Stark. She is on metformin and does not have recent labs. Heidi Stark Stark is not a vegetarian and does not have a previous diagnosis of pernicious anemia. She does not have a history of weight loss surgery.   Depression Screen Heidi Stark Stark's Food and Mood (modified PHQ-9) score was 12. Depression screen Heidi Stark Stark 2/9 09/09/2018  Decreased Interest 3  Down, Depressed, Hopeless 0  PHQ - 2 Score 3  Altered sleeping 3  Tired, decreased energy 3  Change in appetite 0  Feeling bad or failure about yourself  0  Trouble concentrating 0  Moving slowly or fidgety/restless 3  Suicidal thoughts 0  PHQ-9 Score 12  Difficult doing work/chores Not difficult at all  Some recent data might be hidden   ASSESSMENT AND PLAN:  Other Heidi Stark - Plan: EKG 12-Lead, T4, free, TSH, T3  Shortness of breath on exertion - Plan: Lipid  Panel With LDL/HDL Ratio  Type 2 diabetes mellitus without complication, with long-term current use of insulin (Heidi Stark Stark) - Plan: Comprehensive metabolic panel  Atrial fibrillation, unspecified type (Heidi Stark Stark)  Essential hypertension  Vitamin D deficiency - Plan: VITAMIN D 25 Hydroxy (Vit-D Deficiency, Fractures), PTH, Intact and Calcium  B12 nutritional deficiency - Plan: Vitamin B12, PTH, Intact and Calcium  Depression  screening  Class 3 severe obesity with serious comorbidity and body mass index (BMI) of 40.0 to 44.9 in adult, unspecified obesity type (Heidi Stark Stark)  PLAN:  Heidi Stark Stark was informed that her Heidi Stark may be related to obesity, depression or many other causes. Labs will be ordered, and in the meanwhile Heidi Stark Stark has agreed to work on diet, exercise and weight loss to help with Heidi Stark. Proper sleep hygiene was discussed including the need for 7-8 hours of quality sleep each night. A sleep study was not ordered based on symptoms and Epworth score. An EKG and an indirect calorimetry was ordered today and Heidi Stark Stark agreed to follow up in 2 weeks.  Dyspnea on exertion Heidi Stark Stark's shortness of breath appears to be obesity related and exercise induced. She has agreed to work on weight loss and gradually increase exercise to treat her exercise induced shortness of breath. If Heidi Stark Stark follows our instructions and loses weight without improvement of her shortness of breath, we will plan to refer to pulmonology. We will order labs, an indirect calorimetry, and an EKG today. We will monitor this condition regularly. Heidi Stark Stark agrees to this plan.  Diabetes II Heidi Stark Stark has been given extensive diabetes education by myself today including ideal fasting and post-prandial blood glucose readings, individual ideal Hgb A1c goals, and hypoglycemia prevention. We discussed the importance of good blood sugar control to decrease the likelihood of diabetic complications such as nephropathy, neuropathy, limb loss, blindness, coronary artery disease, and death. We discussed the importance of intensive lifestyle modification including diet, exercise and weight loss as the first line treatment for diabetes. Heidi Stark Stark agrees to check her blood sugars at least twice a day and to bring in her log. She agrees to start her diet and to continue her diabetes medications and she will follow up at the agreed upon time in 2 weeks.  Atrial Fibrillation Heidi Stark Stark agrees to  start her diet and weight loss. She was also recommended to stop smoking. She will follow up as directed.  Hypertension We discussed sodium restriction, working on healthy weight loss, and a regular exercise program as the means to achieve improved blood pressure control. We will continue to monitor her blood pressure as well as her progress with the above lifestyle modifications. She will start her diet and continue her medications as prescribed and will watch for signs of hypotension as she continues her lifestyle modifications. Labs were ordered today. Heidi Stark Stark agreed with this plan and agreed to follow up as directed.  Vitamin D Deficiency Heidi Stark Stark was informed that low vitamin D levels contributes to Heidi Stark and are associated with obesity, breast, and colon cancer. we ordered a vitamin D level and a PTH. Zuha agrees to follow up in 2 weeks.  Vitamin B12 Deficiency Heidi Stark Stark will work on increasing B12 rich foods in her diet. B12 supplementation was not prescribed today. We will plan on checking labs today and she agrees to follow up at the agreed upon time..  Depression Screen Heidi Stark Stark had a moderately positive depression screening. Depression is commonly associated with obesity and often results in emotional eating behaviors. We will monitor this closely and work on CBT to  help improve the non-hunger eating patterns. Referral to Psychology may be required if no improvement is seen as she continues in our clinic.  Obesity Heidi Stark Stark is currently in the action stage of change and her goal is to continue with weight loss efforts. She has agreed to follow the Category 2 plan. Charnise has been instructed to work up to a goal of 150 minutes of combined cardio and strengthening exercise per week for weight loss and overall health benefits. We discussed the following Behavioral Modification Strategies today: increasing lean protein intake and decreasing simple carbohydrates.   Lovena has agreed to follow up with our  clinic in 2 weeks. She was informed of the importance of frequent follow up visits to maximize her success with intensive lifestyle modifications for her multiple health conditions. She was informed we would discuss her lab results at her next visit unless there is a critical issue that needs to be addressed sooner. Chrislynn agreed to keep her next visit at the agreed upon time to discuss these results.  ALLERGIES: Allergies  Allergen Reactions  . Neomycin-Bacitracin Zn-Polymyx Rash  . Sulfonamide Derivatives Other (See Comments)    Stomach cramps  . Ciprofloxacin Hives, Itching and Rash  . Spironolactone Rash    MEDICATIONS: Current Outpatient Medications on File Prior to Visit  Medication Sig Dispense Refill  . Canagliflozin-metFORMIN HCl (INVOKAMET) 220 [id number] MG TABS Take 1 tablet by mouth 2 (two) times daily.    Marland Kitchen dofetilide (TIKOSYN) 125 MCG capsule Take 1 capsule (125 mcg total) by mouth 2 (two) times daily. 30 capsule 0  . ENTRESTO 49-51 MG TAKE 1 TABLET BY MOUTH TWICE A DAY 60 tablet 3  . eplerenone (INSPRA) 25 MG tablet TAKE 1 TABLET BY MOUTH EVERY DAY 90 tablet 3  . fenofibrate 160 MG tablet Take 1 tablet (160 mg total) by mouth daily. 90 tablet 1  . furosemide (LASIX) 40 MG tablet Take 1.5 tablets (60 mg total) by mouth 2 (two) times daily. 270 tablet 3  . Icosapent Ethyl (VASCEPA) 1 g CAPS Take 2 capsules (2 g total) by mouth 2 (two) times daily. 65 capsule 6  . insulin glargine (LANTUS) 100 UNIT/ML injection Inject 15 Units into the skin at bedtime.     Marland Kitchen KLOR-CON M10 10 MEQ tablet TAKE 3 TABLETS BY MOUTH TWICE A DAY 540 tablet 1  . metoprolol succinate (TOPROL-XL) 25 MG 24 hr tablet TAKE 1 TABLET BY MOUTH TWICE A DAY 180 tablet 2  . rosuvastatin (CRESTOR) 10 MG tablet Take 10 mg by mouth daily.     No current facility-administered medications on file prior to visit.     PAST MEDICAL HISTORY: Past Medical History:  Diagnosis Date  . A-fib ([place])   . Arthritis    "hands,  wrists" (11/07/2016)  . CHF (congestive heart failure) ([place])   . Chronic lower back pain   . Constipation   . COPD (chronic obstructive pulmonary disease) ([place])   . DVT (deep venous thrombosis) ([place])   . Dyspnea   . History of blood transfusion 1990   "related to OR"  . History of kidney stones   . Hyperlipidemia   . Hypertension   . Joint pain   . Lower extremity edema   . OSA on CPAP   . Persistent atrial fibrillation   . Pneumonia    "several times" (11/07/2016)  . Type II diabetes mellitus ([place])     PAST SURGICAL HISTORY: Past Surgical History:  Procedure Laterality Date  . ATRIAL  FIBRILLATION ABLATION N/A 11/07/2016   Procedure: Atrial Fibrillation Ablation;  Surgeon: [name], MD;  Location: [facility];  Service: Cardiovascular;  Laterality: N/A;  . BACK SURGERY    . CARDIAC CATHETERIZATION    . CARDIOVERSION N/A 08/11/2016   Procedure: CARDIOVERSION;  Surgeon: Larey Dresser, MD;  Location: [facility] ENDOSCOPY;  Service: Cardiovascular;  Laterality: N/A;  . CARDIOVERSION N/A 12/03/2017   Procedure: CARDIOVERSION;  Surgeon: Larey Dresser, MD;  [facility];  Service: Cardiovascular;  Laterality: N/A;  . CARDIOVERSION N/A 05/02/2018   Procedure: CARDIOVERSION;  Surgeon: Pixie Casino, MD;  Location: [facility];  Service: Cardiovascular;  Laterality: N/A;  . CARPAL TUNNEL RELEASE    . CATARACT EXTRACTION W/ INTRAOCULAR LENS  IMPLANT, BILATERAL Bilateral   . COLON SURGERY  1990   vein graft and colon repair after nicked artery with back surgert  . COLONOSCOPY    . CYSTECTOMY     between bladder and kidneys  . GANGLION CYST EXCISION Left   . KNEE CARTILAGE SURGERY Right 1960s  . [place] SURGERY  1990  . REPAIR ILIAC ARTERY  1990   vein graft and colon repair after nicked artery with back surgert  . TEE WITHOUT CARDIOVERSION N/A 10/31/2013   Procedure: TRANSESOPHAGEAL ECHOCARDIOGRAM (TEE);  Surgeon: Larey Dresser, MD;  Location: [facility];  Service: Cardiovascular;  Laterality: N/A;  . TUBAL LIGATION      SOCIAL HISTORY: Social History   Tobacco Use  . Smoking status: Former Smoker    Packs/day: 1.00    Years: 50.00    Pack years: 50.00    Types: Cigarettes  . Smokeless tobacco: Never Used  Substance Use Topics  . Alcohol use: No    Alcohol/week: 0.0 standard drinks  . Drug use: No    FAMILY HISTORY: Family History  Problem Relation Age of Onset  . Diabetes Mother   . Hypertension Mother   . Cancer Mother   . Obesity Mother   . Diabetes Father   . Obesity Father   . Diabetes Other   . Melanoma Other   . Factor V Leiden deficiency Daughter     ROS: Review of Systems  Constitutional: Positive for malaise/Heidi Stark. Negative for weight loss.  HENT:       Positive for dentures. Positive for dry mouth.  Eyes:       Wears glasses or contacts.  Respiratory: Positive for shortness of breath ( with activity).        Positive for difficulty while lying down.  Cardiovascular: Negative for chest pain and orthopnea.       Positive for calf and leg pain with walking.  Genitourinary: Positive for frequency.  Musculoskeletal: Positive for back pain.  Skin:       Positive for dryness.  Endo/Heme/Allergies: Positive for polydipsia. Bruises/bleeds easily.  Psychiatric/Behavioral: The patient has insomnia.     PHYSICAL EXAM: Blood pressure 111/69, pulse 92, temperature 98.1 F (36.7 C), temperature source Oral, height 5\' 4"  (1.626 m), weight 259 lb (117.5 kg), SpO2 91 %. Body mass index is 44.46 kg/m. Physical Exam Vitals signs reviewed.  Constitutional:      Appearance: Normal appearance. She is obese.  HENT:     Head: Normocephalic and atraumatic.     Nose: Nose normal.  Eyes:     General: No scleral icterus.    Extraocular Movements: Extraocular movements intact.  Neck:     Musculoskeletal: Normal range of motion and neck supple.  Thyroid: No thyromegaly.     Comments: Negative for  thyromegaly. Cardiovascular:     Rate and Rhythm: Normal rate and regular rhythm.  Pulmonary:     Effort: Pulmonary effort is normal. No respiratory distress.     Breath sounds: Examination of the right-upper field reveals wheezing. Examination of the left-upper field reveals wheezing. Wheezing ( inspiration) present.  Abdominal:     Palpations: Abdomen is soft.     Tenderness: There is no abdominal tenderness.     Comments: Positive for obesity.  Musculoskeletal:     Right lower leg: Edema (1+) present.     Left lower leg: Edema ( 1+) present.     Comments: ROM normal in all extremities.  Skin:    General: Skin is warm and dry.  Neurological:     Mental Status: She is alert and oriented to person, place, and time.     Coordination: Coordination normal.  Psychiatric:        Mood and Affect: Mood normal.        Behavior: Behavior normal.     RECENT LABS AND TESTS: BMET    Component Value Date/Time   NA 138 09/02/2018 1401   NA 144 10/25/2016 1147   K 4.1 09/02/2018 1401   CL 95 (L) 09/02/2018 1401   CO2 26 09/02/2018 1401   GLUCOSE 272 (H) 09/02/2018 1401   BUN 18 09/02/2018 1401   BUN 19 10/25/2016 1147   CREATININE 1.06 (H) 09/02/2018 1401   CREATININE 0.85 06/28/2018 1410   CALCIUM 8.4 (L) 09/02/2018 1401   GFRNONAA 51 (L) 09/02/2018 1401   GFRAA 59 (L) 09/02/2018 1401   Lab Results  Component Value Date   HGBA1C 8.0 (H) 06/28/2018   No results found for: INSULIN CBC    Component Value Date/Time   WBC 9.6 06/28/2018 1410   RBC 4.97 06/28/2018 1410   HGB 15.2 06/28/2018 1410   HGB 15.2 10/25/2016 1147   HCT 45.4 (H) 06/28/2018 1410   HCT 45.5 10/25/2016 1147   PLT 303 06/28/2018 1410   PLT 305 10/25/2016 1147   MCV 91.3 06/28/2018 1410   MCV 88 10/25/2016 1147   MCH 30.6 06/28/2018 1410   MCHC 33.5 06/28/2018 1410   RDW 13.3 06/28/2018 1410   RDW 16.4 (H) 10/25/2016 1147   LYMPHSABS 3,658 06/28/2018 1410   LYMPHSABS 3.2 (H) 10/25/2016 1147    MONOABS 1.2 (H) 11/30/2014 1601   EOSABS 154 06/28/2018 1410   EOSABS 0.2 10/25/2016 1147   BASOSABS 38 06/28/2018 1410   BASOSABS 0.0 10/25/2016 1147   Iron/TIBC/Ferritin/ %Sat No results found for: IRON, TIBC, FERRITIN, IRONPCTSAT Lipid Panel     Component Value Date/Time   CHOL 144 06/28/2018 1410   TRIG 322 (H) 06/28/2018 1410   HDL 28 (L) 06/28/2018 1410   CHOLHDL 5.1 (H) 06/28/2018 1410   VLDL 76 (H) 04/01/2018 1440   LDLCALC 77 06/28/2018 1410   LDLDIRECT 42.0 11/30/2014 1226   Hepatic Function Panel     Component Value Date/Time   PROT 7.0 06/28/2018 1410   ALBUMIN 3.4 (L) 12/03/2017 1005   AST 21 06/28/2018 1410   ALT 18 06/28/2018 1410   ALKPHOS 36 (L) 12/03/2017 1005   BILITOT 0.3 06/28/2018 1410   BILIDIR 0.1 11/30/2014 1226      Component Value Date/Time   TSH 3.03 06/28/2018 1410   ECG  shows NSR with a rate of 74 BPM. INDIRECT CALORIMETER done today shows a VO2 of 265  and a REE of 1841. Her calculated basal metabolic rate is 5400 thus her basal metabolic rate is better than expected.  OBESITY BEHAVIORAL INTERVENTION VISIT  Today's visit was # 1   Starting weight: 259 lbs Starting date: 09/09/2018 Today's weight : Weight: 259 lb (117.5 kg)  Today's date: 09/09/2018 Total lbs lost to date: 0 At least 15 minutes were spent on discussing the following behavioral intervention visit.   09/09/2018  Height 5\' 4"  (1.626 m)  Weight 259 lb (117.5 kg)  BMI (Calculated) 44.44  BLOOD PRESSURE - SYSTOLIC 867  BLOOD PRESSURE - DIASTOLIC 69  Waist Measurement  58 inches   Body Fat % 54.7 %  Total Body Water (lbs) 87.4 lbs  RMR 1841    ASK: We discussed the diagnosis of obesity with [name] today and Heidi Stark Stark agreed to give [name] permission to discuss obesity behavioral modification therapy today.  ASSESS: Camdynn has the diagnosis of obesity and her BMI today is 44.44. Adalida is in the action stage of change.   ADVISE: Artie was educated on the multiple  health risks of obesity as well as the benefit of weight loss to improve her health. She was advised of the need for long term treatment and the importance of lifestyle modifications to improve her current health and to decrease her risk of future health problems.  AGREE: Multiple dietary modification options and treatment options were discussed and Marcy agreed to follow the recommendations documented in the above note.  ARRANGE: Mahoganie was educated on the importance of frequent visits to treat obesity as outlined per CMS and USPSTF guidelines and agreed to schedule her next follow up appointment today.   IMarcille Blanco, CMA, am acting as transcriptionist for Starlyn Skeans, MD  I have reviewed the above documentation for accuracy and completeness, and I agree with the above. -Dennard Nip, MD

## 2018-09-11 ENCOUNTER — Telehealth: Payer: Self-pay | Admitting: Cardiology

## 2018-09-11 ENCOUNTER — Telehealth (INDEPENDENT_AMBULATORY_CARE_PROVIDER_SITE_OTHER): Payer: Self-pay

## 2018-09-11 DIAGNOSIS — Z79899 Other long term (current) drug therapy: Secondary | ICD-10-CM

## 2018-09-11 DIAGNOSIS — I480 Paroxysmal atrial fibrillation: Secondary | ICD-10-CM

## 2018-09-11 NOTE — Telephone Encounter (Signed)
Advised to hold K+ for 5 days and repeat BMET next Thursday, 3/5.  Pt understands we will f/u after labs next week. She is agreeable to plan.

## 2018-09-11 NOTE — Telephone Encounter (Signed)
Called patient to inform her of the most recent lab results (elevated Potassium) and to hold her potassium supplement at this time but the patient states that her Cardiologist asked her to not make any changes to her potassium unless he makes it. Called Dr [name] office and informed Lelon Frohlich (his nurse) of the abnormal level in which they are able to see in Epic and what the patient told me. Lelon Frohlich is forwarding the lab to Dr [name] and they will contact the patient with any changes if any are needed. Dr Leafy Ro made aware. April, [place]

## 2018-09-11 NOTE — Telephone Encounter (Signed)
Informed pt that Dr. [name] is ok with alternating q21m with Dr. [name].  Pt informed that I will place a recall for November w/ [place]. Patient verbalized understanding and agreeable to plan.

## 2018-09-11 NOTE — Progress Notes (Signed)
Please contact pt and tell her to stop her potassium supplement as her level was too high. We will recheck her potassium again at her follow up visit.

## 2018-09-11 NOTE — Telephone Encounter (Signed)
Heidi Stark with Dr. Migdalia Dk office called to report they drew labs on the pt 09/09/18 and her K was 5.4 they had advised her to hold her K but she declined since she was told by [name] to never hold her K due to her Tikosyn regimen... so Dr. Leafy Ro wanted Dr. [name] to be aware and to let the pt know if he has any new recommendation for her.

## 2018-09-11 NOTE — Telephone Encounter (Signed)
lmtcb  (per Camnitz - pt should hold for 5 days and repeat lab in a week)

## 2018-09-11 NOTE — Telephone Encounter (Signed)
Follow Up:      Returning your call from today. 

## 2018-09-11 NOTE — Telephone Encounter (Signed)
New message   April from Dr. Migdalia Dk office calling to discuss abnormal lab result involving Heidi Stark.

## 2018-09-11 NOTE — Progress Notes (Signed)
Patient Cardiologist made aware of abnormal potassium level and they will make any changes if needed. See telephone message 09/11/18. April, [place]

## 2018-09-12 ENCOUNTER — Other Ambulatory Visit (HOSPITAL_COMMUNITY): Payer: Self-pay | Admitting: *Deleted

## 2018-09-12 MED ORDER — FUROSEMIDE 40 MG PO TABS: 60.0000 mg | ORAL_TABLET | Freq: Two times a day (BID) | ORAL | 3 refills | Status: DC

## 2018-09-19 ENCOUNTER — Other Ambulatory Visit: Payer: Medicare Other | Admitting: *Deleted

## 2018-09-19 DIAGNOSIS — Z79899 Other long term (current) drug therapy: Secondary | ICD-10-CM

## 2018-09-19 DIAGNOSIS — I480 Paroxysmal atrial fibrillation: Secondary | ICD-10-CM

## 2018-09-20 ENCOUNTER — Telehealth: Payer: Self-pay | Admitting: Cardiology

## 2018-09-20 DIAGNOSIS — E876 Hypokalemia: Secondary | ICD-10-CM

## 2018-09-20 LAB — BASIC METABOLIC PANEL
BUN/Creatinine Ratio: 28 (ref 12–28)
BUN: 30 mg/dL — ABNORMAL HIGH (ref 8–27)
CO2: 26 mmol/L (ref 20–29)
Calcium: 9 mg/dL (ref 8.7–10.3)
Chloride: 97 mmol/L (ref 96–106)
Creatinine, Ser: 1.07 mg/dL — ABNORMAL HIGH (ref 0.57–1.00)
GFR calc Af Amer: 58 mL/min/{1.73_m2} — ABNORMAL LOW (ref >59)
GFR calc non Af Amer: 51 mL/min/{1.73_m2} — ABNORMAL LOW (ref >59)
Glucose: 153 mg/dL — ABNORMAL HIGH (ref 65–99)
Potassium: 4.7 mmol/L (ref 3.5–5.2)
Sodium: 140 mmol/L (ref 134–144)

## 2018-09-20 NOTE — Telephone Encounter (Signed)
Pt aware I will discuss her K+ w/ Camnitz next week. She understands I will f/u next week.

## 2018-09-20 NOTE — Telephone Encounter (Signed)
New Message   Patient states she wants to speak to [facility] about potassium and she won't give anymore information.

## 2018-09-23 ENCOUNTER — Encounter (INDEPENDENT_AMBULATORY_CARE_PROVIDER_SITE_OTHER): Payer: Self-pay | Admitting: Family Medicine

## 2018-09-23 ENCOUNTER — Ambulatory Visit (INDEPENDENT_AMBULATORY_CARE_PROVIDER_SITE_OTHER): Payer: Medicare Other | Admitting: Family Medicine

## 2018-09-23 VITALS — BP 111/68 | HR 63 | Temp 97.9°F | Ht 64.0 in | Wt 255.0 lb

## 2018-09-23 DIAGNOSIS — Z794 Long term (current) use of insulin: Secondary | ICD-10-CM

## 2018-09-23 DIAGNOSIS — E559 Vitamin D deficiency, unspecified: Secondary | ICD-10-CM | POA: Diagnosis not present

## 2018-09-23 DIAGNOSIS — E875 Hyperkalemia: Secondary | ICD-10-CM | POA: Diagnosis not present

## 2018-09-23 DIAGNOSIS — J3089 Other allergic rhinitis: Secondary | ICD-10-CM | POA: Diagnosis not present

## 2018-09-23 DIAGNOSIS — E1165 Type 2 diabetes mellitus with hyperglycemia: Secondary | ICD-10-CM

## 2018-09-23 DIAGNOSIS — Z6841 Body Mass Index (BMI) 40.0 and over, adult: Secondary | ICD-10-CM

## 2018-09-23 MED ORDER — VITAMIN D (ERGOCALCIFEROL) 1.25 MG (50000 UNIT) PO CAPS: 50000.0000 [IU] | ORAL_CAPSULE | ORAL | 0 refills | Status: DC

## 2018-09-24 ENCOUNTER — Encounter (INDEPENDENT_AMBULATORY_CARE_PROVIDER_SITE_OTHER): Payer: Self-pay | Admitting: Family Medicine

## 2018-09-24 NOTE — Progress Notes (Signed)
Office: [phone]  /  Fax: [phone]   HPI:   Chief Complaint: OBESITY Heidi Stark is here to discuss her progress with her obesity treatment plan. She is on the Category 2 plan and is following her eating plan approximately 80 % of the time. She states she is exercising 0 minutes 0 times per week. [name] did well with weight loss on her Category 2 plan. She ate most of her food and felt her hunger was pretty well controlled.  Her weight is 255 lb (115.7 kg) today and has had a weight loss of 4 pounds over a period of 2 weeks since her last visit. She has lost 4 lbs since starting treatment with [name].  Diabetes II Heidi Stark has a diagnosis of diabetes type II. Heidi Stark did not bring in her blood sugar log, but states fasting blood sugars are ranging from 120 to 160's. Her last A1c was 8.0 on 06/28/18. She has been working on intensive lifestyle modifications including diet, exercise, and weight loss to help control her blood glucose levels. She denies any hypoglycemic episodes.  Vitamin D Deficiency Heidi Stark has a new diagnosis of vitamin D deficiency. She is not currently taking vit D and her level is very low at 4.3 on 09/09/18. Heidi Stark admits fatigue.  Hyperkalemia Heidi Stark is on potassium, per her cardiologist, as her potassium tends to drop low on her Tikosyn for atrial fibrillation, which puts her at higher risk of prolonged QT and torsades de pointes.  Allergic Rhinitis Heidi Stark has allergic rhinitis and has taken OTC Claritin for 2 days, but still notes sneezing and rhinorrhea. She denies fever.  ASSESSMENT AND PLAN:  Type 2 diabetes mellitus with hyperglycemia, with long-term current use of insulin (HCC)  Vitamin D deficiency - Plan: Vitamin D, Ergocalciferol, (DRISDOL) 1.25 MG (50000 UT) CAPS capsule  Hyperkalemia  Allergic rhinitis due to other allergic trigger, unspecified seasonality  Class 3 severe obesity with serious comorbidity and body mass index (BMI) of 40.0 to 44.9 in adult,  unspecified obesity type ([place])  PLAN:  Diabetes II Heidi Stark has been given extensive diabetes education by myself today including ideal fasting and post-prandial blood glucose readings, individual ideal Hgb A1c goals, and hypoglycemia prevention. We discussed the importance of good blood sugar control to decrease the likelihood of diabetic complications such as nephropathy, neuropathy, limb loss, blindness, coronary artery disease, and death. We discussed the importance of intensive lifestyle modification including diet, exercise and weight loss as the first line treatment for diabetes. Heidi Stark agrees to continue her diet and to bring in her blood sugar log. She will watch for signs of hypoglycemia and will follow up at the agreed upon time in 2 to 3 weeks.  Vitamin D Deficiency Heidi Stark was informed that low vitamin D levels contributes to fatigue and are associated with obesity, breast, and colon cancer. Heidi Stark agrees to start to take prescription Vit D @50 ,000 IU every 3 days #10 with no refills and will follow up for routine testing of vitamin D, at least 2-3 times per year. She was informed of the risk of over-replacement of vitamin D and agrees to not increase her dose unless she discusses this with [name] first. Heidi Stark agrees to follow up in 2 to 3 weeks as directed.  Hyperkalemia Heidi Stark is to continue to have cardiology monitor and manage her potassium levels while she maintains her moderate potassium diet. Heidi Stark agrees to this plan and to follow up at the agreed upon time.  Allergic Rhinitis Diara agrees to start  OTC Flonase, 2 sprays each nostril qd and to follow up as directed.  Obesity Heidi Stark is currently in the action stage of change. As such, her goal is to continue with weight loss efforts. She has agreed to follow the Category 2 plan. Heidi Stark has been instructed to work up to a goal of 150 minutes of combined cardio and strengthening exercise per week for weight loss and overall health  benefits. We discussed the following Behavioral Modification Stratagies today: increasing lean protein intake, decreasing simple carbohydrates, no skipping meals, and work on meal planning and easy cooking plans.  Heidi Stark has agreed to follow up with our clinic in 2 to 3 weeks. She was informed of the importance of frequent follow up visits to maximize her success with intensive lifestyle modifications for her multiple health conditions.  ALLERGIES: Allergies  Allergen Reactions  . Neomycin-Bacitracin Zn-Polymyx Rash  . Sulfonamide Derivatives Other (See Comments)    Stomach cramps  . Ciprofloxacin Hives, Itching and Rash  . Spironolactone Rash    MEDICATIONS: Current Outpatient Medications on File Prior to Visit  Medication Sig Dispense Refill  . Canagliflozin-metFORMIN HCl (INVOKAMET) [phone] MG TABS Take 1 tablet by mouth 2 (two) times daily.    Heidi Stark dofetilide (TIKOSYN) 125 MCG capsule Take 1 capsule (125 mcg total) by mouth 2 (two) times daily. 30 capsule 0  . ENTRESTO 49-51 MG TAKE 1 TABLET BY MOUTH TWICE A DAY 60 tablet 3  . eplerenone (INSPRA) 25 MG tablet TAKE 1 TABLET BY MOUTH EVERY DAY 90 tablet 3  . fenofibrate 160 MG tablet Take 1 tablet (160 mg total) by mouth daily. 90 tablet 1  . furosemide (LASIX) 40 MG tablet Take 1.5 tablets (60 mg total) by mouth 2 (two) times daily. 270 tablet 3  . Icosapent Ethyl (VASCEPA) 1 g CAPS Take 2 capsules (2 g total) by mouth 2 (two) times daily. 65 capsule 6  . insulin glargine (LANTUS) 100 UNIT/ML injection Inject 15 Units into the skin at bedtime.     Heidi Stark KLOR-CON M10 10 MEQ tablet TAKE 3 TABLETS BY MOUTH TWICE A DAY 540 tablet 1  . metoprolol succinate (TOPROL-XL) 25 MG 24 hr tablet TAKE 1 TABLET BY MOUTH TWICE A DAY 180 tablet 2  . rosuvastatin (CRESTOR) 10 MG tablet Take 10 mg by mouth daily.     No current facility-administered medications on file prior to visit.     PAST MEDICAL HISTORY: Past Medical History:  Diagnosis Date  .  A-fib ([place])   . Arthritis    "hands, wrists" (11/07/2016)  . CHF (congestive heart failure) ([place])   . Chronic lower back pain   . Constipation   . COPD (chronic obstructive pulmonary disease) ([place])   . DVT (deep venous thrombosis) ([place])   . Dyspnea   . History of blood transfusion 1990   "related to OR"  . History of kidney stones   . Hyperlipidemia   . Hypertension   . Joint pain   . Lower extremity edema   . OSA on CPAP   . Persistent atrial fibrillation   . Pneumonia    "several times" (11/07/2016)  . Type II diabetes mellitus ([place])     PAST SURGICAL HISTORY: Past Surgical History:  Procedure Laterality Date  . ATRIAL FIBRILLATION ABLATION N/A 11/07/2016   Procedure: Atrial Fibrillation Ablation;  Surgeon: [name], MD;  Location: [place] [facility];  Service: Cardiovascular;  Laterality: N/A;  . BACK SURGERY    . CARDIAC CATHETERIZATION    .  CARDIOVERSION N/A 08/11/2016   Procedure: CARDIOVERSION;  Surgeon: Larey Dresser, MD;  Location[facility] ENDOSCOPY;  Service: Cardiovascular;  Laterality: N/A;  . CARDIOVERSION N/A 12/03/2017   Procedure: CARDIOVERSION;  Surgeon: Larey Dresser, MD;  Location[facility] ENDOSCOPY;  Service: Cardiovascular;  Laterality: N/A;  . CARDIOVERSION N/A 05/02/2018   Procedure: CARDIOVERSION;  Surgeon: Pixie Casino, MD;  Location: [facility] ENDOSCOPY;  Service: Cardiovascular;  Laterality: N/A;  . CARPAL TUNNEL RELEASE    . CATARACT EXTRACTION W/ INTRAOCULAR LENS  IMPLANT, BILATERAL Bilateral   . COLON SURGERY  1990   vein graft and colon repair after nicked artery with back surgert  . COLONOSCOPY    . CYSTECTOMY     between bladder and kidneys  . GANGLION CYST EXCISION Left   . KNEE CARTILAGE SURGERY Right 1960s  . [place] SURGERY  1990  . REPAIR ILIAC ARTERY  1990   vein graft and colon repair after nicked artery with back surgert  . TEE WITHOUT CARDIOVERSION N/A 10/31/2013   Procedure: TRANSESOPHAGEAL ECHOCARDIOGRAM (TEE);  Surgeon:  Larey Dresser, MD;  Location: [place];  Service: Cardiovascular;  Laterality: N/A;  . TUBAL LIGATION      SOCIAL HISTORY: Social History   Tobacco Use  . Smoking status: Former Smoker    Packs/day: 1.00    Years: 50.00    Pack years: 50.00    Types: Cigarettes  . Smokeless tobacco: Never Used  Substance Use Topics  . Alcohol use: No    Alcohol/week: 0.0 standard drinks  . Drug use: No    FAMILY HISTORY: Family History  Problem Relation Age of Onset  . Diabetes Mother   . Hypertension Mother   . Cancer Mother   . Obesity Mother   . Diabetes Father   . Obesity Father   . Diabetes Other   . Melanoma Other   . Factor V Leiden deficiency Daughter     ROS: Review of Systems  Constitutional: Positive for malaise/fatigue and weight loss. Negative for fever.  HENT:       Positive for sneezing. Positive for rhinorrhea.  Endo/Heme/Allergies:       Negative for hypoglycemia. Positive for hyperglycemia.    PHYSICAL EXAM: Blood pressure 111/68, pulse 63, temperature 97.9 F (36.6 C), temperature source Oral, height 5\' 4"  (3.382 m), weight 255 lb (115.7 kg), SpO2 90 %. Body mass index is 43.77 kg/m. Physical Exam Vitals signs reviewed.  Constitutional:      Appearance: Normal appearance. She is obese.  Cardiovascular:     Rate and Rhythm: Normal rate.  Pulmonary:     Effort: Pulmonary effort is normal.  Musculoskeletal: Normal range of motion.  Skin:    General: Skin is warm and dry.  Neurological:     Mental Status: She is alert and oriented to person, place, and time.  Psychiatric:        Mood and Affect: Mood normal.        Behavior: Behavior normal.     RECENT LABS AND TESTS: BMET    Component Value Date/Time   NA 140 09/19/2018 1323   K 4.7 09/19/2018 1323   CL 97 09/19/2018 1323   CO2 26 09/19/2018 1323   GLUCOSE 153 (H) 09/19/2018 1323   GLUCOSE 272 (H) 09/02/2018 1401   BUN 30 (H) 09/19/2018 1323   CREATININE 1.07 (H) 09/19/2018 1323    CREATININE 0.85 06/28/2018 1410   CALCIUM 9.0 09/19/2018 1323   GFRNONAA 51 (L) 09/19/2018 1323  GFRAA 58 (L) 09/19/2018 1323   Lab Results  Component Value Date   HGBA1C 8.0 (H) 06/28/2018   HGBA1C 7.7 (H) 10/11/2015   HGBA1C 7.3 (H) 11/30/2014   HGBA1C 7.5 (H) 11/24/2014   HGBA1C 7.3 (A) 06/23/2014   No results found for: INSULIN CBC    Component Value Date/Time   WBC 9.6 06/28/2018 1410   RBC 4.97 06/28/2018 1410   HGB 15.2 06/28/2018 1410   HGB 15.2 10/25/2016 1147   HCT 45.4 (H) 06/28/2018 1410   HCT 45.5 10/25/2016 1147   PLT 303 06/28/2018 1410   PLT 305 10/25/2016 1147   MCV 91.3 06/28/2018 1410   MCV 88 10/25/2016 1147   MCH 30.6 06/28/2018 1410   MCHC 33.5 06/28/2018 1410   RDW 13.3 06/28/2018 1410   RDW 16.4 (H) 10/25/2016 1147   LYMPHSABS 3,658 06/28/2018 1410   LYMPHSABS 3.2 (H) 10/25/2016 1147   MONOABS 1.2 (H) 11/30/2014 1601   EOSABS 154 06/28/2018 1410   EOSABS 0.2 10/25/2016 1147   BASOSABS 38 06/28/2018 1410   BASOSABS 0.0 10/25/2016 1147   Iron/TIBC/Ferritin/ %Sat No results found for: IRON, TIBC, FERRITIN, IRONPCTSAT Lipid Panel     Component Value Date/Time   CHOL 150 09/09/2018 1326   TRIG 362 (H) 09/09/2018 1326   HDL 32 (L) 09/09/2018 1326   CHOLHDL 5.1 (H) 06/28/2018 1410   VLDL 76 (H) 04/01/2018 1440   LDLCALC 46 09/09/2018 1326   LDLCALC 77 06/28/2018 1410   LDLDIRECT 42.0 11/30/2014 1226   Hepatic Function Panel     Component Value Date/Time   PROT 7.1 09/09/2018 1326   ALBUMIN 4.2 09/09/2018 1326   AST 20 09/09/2018 1326   ALT 13 09/09/2018 1326   ALKPHOS 47 09/09/2018 1326   BILITOT 0.3 09/09/2018 1326   BILIDIR 0.1 11/30/2014 1226      Component Value Date/Time   TSH 1.740 09/09/2018 1326   TSH 3.03 06/28/2018 1410   TSH 4.964 (H) 01/01/2017 1038   TSH 5.862 (H) 08/31/2016 1329   TSH 6.259 (H) 08/21/2016 1138   TSH 3.30 11/07/2007 0919   Results for ARNESHIA, ADE (MRN [id number]) as of 09/24/2018 09:23  Ref.  Range 09/09/2018 13:26  Vitamin D, 25-Hydroxy Latest Ref Range: 30.0 - 100.0 ng/mL 4.3 (L)   OBESITY BEHAVIORAL INTERVENTION VISIT  Today's visit was # 2   Starting weight: 259 lbs Starting date: 09/09/18 Today's weight : Weight: 255 lb (115.7 kg)  Today's date: 09/23/2018 Total lbs lost to date: 4 At least 15 minutes were spent on discussing the following behavioral intervention visit.    09/23/2018  Height 5\' 4"  (1.626 m)  Weight 255 lb (115.7 kg)  BMI (Calculated) 43.75  BLOOD PRESSURE - SYSTOLIC 381  BLOOD PRESSURE - DIASTOLIC 68   Body Fat % 01.7 %  Total Body Water (lbs) 85.6 lbs   ASK: We discussed the diagnosis of obesity with [name] today and Vaughan Basta agreed to give [name] permission to discuss obesity behavioral modification therapy today.  ASSESS: Melainie has the diagnosis of obesity and her BMI today is 43.75. Melis is in the action stage of change.   ADVISE: Leveta was educated on the multiple health risks of obesity as well as the benefit of weight loss to improve her health. She was advised of the need for long term treatment and the importance of lifestyle modifications to improve her current health and to decrease her risk of future health problems.  AGREE: Multiple dietary  modification options and treatment options were discussed and Amarilis agreed to follow the recommendations documented in the above note.  ARRANGE: Yanely was educated on the importance of frequent visits to treat obesity as outlined per CMS and USPSTF guidelines and agreed to schedule her next follow up appointment today.  IMarcille Blanco, CMA, am acting as transcriptionist for Starlyn Skeans, MD  I have reviewed the above documentation for accuracy and completeness, and I agree with the above. -Dennard Nip, MD

## 2018-09-24 NOTE — Telephone Encounter (Signed)
Pt reports that she is taking K+ 30 mEq BID. Pt aware I will discuss further w/ Camnitz next week about lab follow up. She understands I will call her next week.

## 2018-09-25 ENCOUNTER — Other Ambulatory Visit (INDEPENDENT_AMBULATORY_CARE_PROVIDER_SITE_OTHER): Payer: Self-pay

## 2018-09-25 DIAGNOSIS — E559 Vitamin D deficiency, unspecified: Secondary | ICD-10-CM

## 2018-09-25 MED ORDER — VITAMIN D (ERGOCALCIFEROL) 1.25 MG (50000 UNIT) PO CAPS: 50000.0000 [IU] | ORAL_CAPSULE | ORAL | 0 refills | Status: DC

## 2018-09-25 NOTE — Telephone Encounter (Signed)
This was an error, please send in the correct dose

## 2018-09-26 ENCOUNTER — Ambulatory Visit (INDEPENDENT_AMBULATORY_CARE_PROVIDER_SITE_OTHER): Payer: Medicare Other | Admitting: Family Medicine

## 2018-10-01 MED ORDER — POTASSIUM CHLORIDE CRYS ER 10 MEQ PO TBCR: 20.0000 meq | EXTENDED_RELEASE_TABLET | Freq: Two times a day (BID) | ORAL | 1 refills | Status: DC

## 2018-10-01 NOTE — Telephone Encounter (Signed)
Follow up  ° ° °Patient is returning call.  °

## 2018-10-01 NOTE — Telephone Encounter (Signed)
Left detailed message (DPR on file) advising pt to decrease K+ to 20 mEq BID, per Dr. [name]. Asked pt to cal the office with any questions/concerns.

## 2018-10-08 ENCOUNTER — Encounter (INDEPENDENT_AMBULATORY_CARE_PROVIDER_SITE_OTHER): Payer: Self-pay

## 2018-10-14 ENCOUNTER — Ambulatory Visit (INDEPENDENT_AMBULATORY_CARE_PROVIDER_SITE_OTHER): Payer: Medicare Other | Admitting: Family Medicine

## 2018-10-23 ENCOUNTER — Other Ambulatory Visit (INDEPENDENT_AMBULATORY_CARE_PROVIDER_SITE_OTHER): Payer: Self-pay | Admitting: Family Medicine

## 2018-10-23 ENCOUNTER — Encounter (INDEPENDENT_AMBULATORY_CARE_PROVIDER_SITE_OTHER): Payer: Self-pay

## 2018-10-23 DIAGNOSIS — E559 Vitamin D deficiency, unspecified: Secondary | ICD-10-CM

## 2018-11-21 ENCOUNTER — Telehealth (HOSPITAL_COMMUNITY): Payer: Self-pay

## 2018-11-21 ENCOUNTER — Telehealth (HOSPITAL_COMMUNITY): Payer: Self-pay | Admitting: Licensed Clinical Social Worker

## 2018-11-21 NOTE — Telephone Encounter (Signed)
CSW consulted to assist pt with [place] concerns.  Pt was getting help through the [employer] for copay assistance but has run out of funds in her grant.  CSW placed pt on waitlist for [employer] in case it reopens before alternative assistance can be found.  CSW then spoke to pt about [employer] assistance program.  CSW explained application process and emailed pt an application to fill out and return to the clinic.  CSW will continue to follow and assist as needed  Jorge Ny, [facility] Desk#: [phone] Cell#: [phone]

## 2018-11-21 NOTE — Telephone Encounter (Signed)
-----   Message from Jorge Ny, [place] sent at 11/21/2018  2:37 PM EDT ----- They will get the same letter but I will give her a call to discuss alternatives :) ----- Message ----- From: Valeda Malm, RN Sent: 11/21/2018  12:52 PM EDT To: Jorge Ny, LCSW  [place]  Received letter from [facility] foundation that patient money is $0.00.  Do they receive same letter?  It is saying if she needs assistance she has to reapply.  Linus Orn

## 2018-11-21 NOTE — Telephone Encounter (Signed)
See communication

## 2018-11-26 ENCOUNTER — Other Ambulatory Visit: Payer: Self-pay | Admitting: Cardiology

## 2018-11-26 MED ORDER — DOFETILIDE 125 MCG PO CAPS: 125.0000 ug | ORAL_CAPSULE | Freq: Two times a day (BID) | ORAL | 6 refills | Status: DC

## 2018-12-02 ENCOUNTER — Telehealth (HOSPITAL_COMMUNITY): Payer: Medicare Other | Admitting: Cardiology

## 2018-12-03 ENCOUNTER — Ambulatory Visit (HOSPITAL_COMMUNITY)
Admission: RE | Admit: 2018-12-03 | Discharge: 2018-12-03 | Disposition: A | Discharge status: Home / Self Care | Payer: Medicare Other | Source: Ambulatory Visit | Attending: Cardiology | Admitting: Cardiology

## 2018-12-03 ENCOUNTER — Other Ambulatory Visit: Payer: Self-pay

## 2018-12-03 ENCOUNTER — Encounter (HOSPITAL_COMMUNITY): Payer: Self-pay | Admitting: *Deleted

## 2018-12-03 DIAGNOSIS — I5032 Chronic diastolic (congestive) heart failure: Secondary | ICD-10-CM | POA: Diagnosis not present

## 2018-12-03 DIAGNOSIS — I480 Paroxysmal atrial fibrillation: Secondary | ICD-10-CM

## 2018-12-03 DIAGNOSIS — E785 Hyperlipidemia, unspecified: Secondary | ICD-10-CM

## 2018-12-03 DIAGNOSIS — I5022 Chronic systolic (congestive) heart failure: Secondary | ICD-10-CM

## 2018-12-03 DIAGNOSIS — I4819 Other persistent atrial fibrillation: Secondary | ICD-10-CM

## 2018-12-03 NOTE — Progress Notes (Signed)
Larey Dresser, MD  Scarlette Calico, RN        1. Followup 6 months.  2. Come by office for lipids, BMET, CBC, Mg and ECG (Tikosyn)    Orders placed, message sent to scheduler to arrange, AVS sent via mychart

## 2018-12-03 NOTE — Patient Instructions (Signed)
Your physician recommends that you return for a FASTING lipid profile: 1-2 weeks  EKG in 1-2 weeks  OUR OFFICE WILL CALL YOU TO SCHEDULE THESE APPOINTMENTS  We will contact you in 6 months to schedule your next appointment.  If you have any questions or concerns before your next appointment please send [name] a message through [place] or call our office at [phone].

## 2018-12-03 NOTE — Progress Notes (Signed)
Heart Failure TeleHealth Note  Due to national recommendations of social distancing due to [name], Audio/video telehealth visit is felt to be most appropriate for this patient at this time.  See MyChart message from today for patient consent regarding telehealth for [facility].  Date:  12/03/2018   ID:  Heidi Stark, DOB Oct 15, 1940, MRN [id number]  Location: Home  Provider location: [place] [facility] Type of Visit: Established patient   PCP:  Ann Held, DO  Cardiologist: Dr. [name]  Chief Complaint: low back pain  History of Present Illness: Heidi Stark is a 78 y.o. female who presents via audio/video conferencing for a telehealth visit today.     she denies symptoms worrisome for COVID 19.   Patient has history of HTN, DM, hyperlipidemia.  She was admitted in 4/15 with splenic and renal infarcts.  She was found to have paroxysmal atrial fibrillation.  In 4/15, she had fever and flank pain.  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  TEE showed prominent aortic plaque.  While she was in the hospital, she was noted to have a brief run of atrial fibrillation and was started on coumadin.    She has left leg pain with ambulation after about 100 feet, LE dopplers 5/15 showed occluded left external iliac artery.  This has been present ever since she had left iliac damage with a prior back surgery.    She was stable until 1/18, when she developed palpitations and exertional dyspnea.  She was admitted to [facility] with atrial fibrillation/RVR and acute systolic CHF.  EF was found to be 30-35%, down from 50-55% in 2015.  She was diuresed and underwent DCCV back to NSR.  She subsequently had atrial fibrillation ablation in 4/18.  She went back into atrial fibrillation in 5/19 and had DCCV back today NSR.   Echo was done 6/19 and reviewed, EF 55%, mild LVH, moderate diastolic dysfunction, normal RV size and systolic  function.   With recurrent atrial fibrillation, she was admitted in 10/19 to start Tikosyn.  She was cardioverted back to NSR.   Patient has been stable recently.  She does not feel tachypalpitations.  No dyspnea walking on flat ground.  No chest pain.  No lightheadedness or falls.  She has been working on weight loss, weight down 20 lbs since last appointment with me.  Stable mild left leg claudication. Main complaint is low back pain, planning to get MRI.    Labs (4/15): K 4.1, creatinine 0.7, HCT 46.2 Labs (7/15): K 3.9, creatinine 0.8, HCT 50 Labs (12/15): K 5, creatinine 0.7, HDL 33, LDL 57 Labs (5/16): K 4, creatinine 0.84, TGs 256, HDL 29, LDL 42, HCT 41.2 Labs (1/18): K 3.4, creatinine 0.78 Labs (2/18): K 4.4, creatinine 0.8, free T3/T4 normal, mildly elevated TSH, LFTs normal, hgb 15.6 Labs (4/18): K 4.2, creatinine 0.86, TSH elevated, free T3 and free T4 normal Labs (5/19): K 3.9, creatinine 0.78 Labs (6/19): K 4.4, creatinine 0.88 Labs (12/19): K 4.2, creatinine 0.85, TGs 322, LDL 77 Labs (2/20): LDL 46, TGs 362 Labs (3/20): K 4.7, creatinine 1.07  PMH: 1.Type II diabetes 2. HTN 3. Hyperlipidemia 4. COPD: has quit smoking.  5. CAD: Coronary CTA (4/18) with nonobstructive coronary disease, calcium score 335 Agatston units (82nd percentile).  6. Paroxysmal atrial fibrillation: First noted in 4/15.  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   -  Event monitor (4/15) with no atrial fibrillation.  - Admission with atrial fibrillation/RVR in 1/18.  - Atrial fibrillation ablation in 4/18.  - DCCV in 5/19.  - Tikosyn started + DCCV in 10/19.  7. Chronic systolic CHF: ?tachycardia-mediated as first noted in setting of atrial fibrillation with RVR.  - TEE (4/15) with EF 50-55%, mild LVH, grade III-IV plaque in descending thoracic aorta, RV normal, no PFO, peak RV-RA gradient 42 mmHg.   - Echo (1/18) with EF 30-35%, diffuse hypokinesis,  moderate LVH, mildly dilated RV, PASP 55 mmHg.  - Echo (4/18) with EF 50%, anterolateral/inferolateral mild hypokinesis, mild aortic stenosis, IVC normal.  - Echo (6/19) with EF 55%, mild LVH, moderate diastolic dysfunction, normal RV size and systolic function.  7. H/o diskectomy. 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 Lower extremity arterial dopplers (5/15) with occluded left external iliac artery.  - ABIs (5/18): 1.16 (normal) on right, 0.56 on left.  9. Aortic stenosis: Mild on 4/18 echo.  10. OSA: Uses CPAP  Current Outpatient Medications  Medication Sig Dispense Refill  . Canagliflozin-metFORMIN HCl (INVOKAMET) [phone] MG TABS Take 1 tablet by mouth 2 (two) times daily.    Marland Kitchen dofetilide (TIKOSYN) 125 MCG capsule Take 1 capsule (125 mcg total) by mouth 2 (two) times daily. 30 capsule 6  . ENTRESTO 49-51 MG TAKE 1 TABLET BY MOUTH TWICE A DAY 60 tablet 3  . eplerenone (INSPRA) 25 MG tablet TAKE 1 TABLET BY MOUTH EVERY DAY 90 tablet 3  . fenofibrate 160 MG tablet Take 1 tablet (160 mg total) by mouth daily. 90 tablet 1  . furosemide (LASIX) 40 MG tablet Take 1.5 tablets (60 mg total) by mouth 2 (two) times daily. 270 tablet 3  . Icosapent Ethyl (VASCEPA) 1 g CAPS Take 2 capsules (2 g total) by mouth 2 (two) times daily. 65 capsule 6  . insulin glargine (LANTUS) 100 UNIT/ML injection Inject 15 Units into the skin at bedtime.     . metoprolol succinate (TOPROL-XL) 25 MG 24 hr tablet TAKE 1 TABLET BY MOUTH TWICE A DAY 180 tablet 2  . potassium chloride (KLOR-CON M10) 10 MEQ tablet Take 2 tablets (20 mEq total) by mouth 2 (two) times daily. 360 tablet 1  . rosuvastatin (CRESTOR) 10 MG tablet Take 10 mg by mouth daily.    . Vitamin D, Ergocalciferol, (DRISDOL) 1.25 MG (50000 UT) CAPS capsule Take 1 capsule (50,000 Units total) by mouth every 3 (three) days. 10 capsule 0   No current facility-administered medications for this encounter.     Allergies:    Neomycin-bacitracin zn-polymyx; Sulfonamide derivatives; Ciprofloxacin; and Spironolactone   Social History:  The patient  reports that she has quit smoking. Her smoking use included cigarettes. She has a 50.00 pack-year smoking history. She has never used smokeless tobacco. She reports that she does not drink alcohol or use drugs.   Family History:  The patient's family history includes Cancer in her mother; Diabetes in her father, mother, and another family member; Factor V Leiden deficiency in her daughter; Hypertension in her mother; Melanoma in an other family member; Obesity in her father and mother.   ROS:  Please see the history of present illness.   All other systems are personally reviewed and negative.   Exam:  (Video/Tele Health Call; Exam is subjective and or/visual.) General:  Speaks in full sentences. No resp difficulty. Lungs: Normal respiratory effort with conversation.  Abdomen: Non-distended per patient report Extremities: Pt denies  edema. Neuro: Alert & oriented x 3.   Recent Labs: 05/13/2018: Magnesium 2.1 06/28/2018: Hemoglobin 15.2; Platelets 303 09/09/2018: ALT 13; TSH 1.740 09/19/2018: BUN 30; Creatinine, Ser 1.07; Potassium 4.7; Sodium 140  Personally reviewed   Wt Readings from Last 3 Encounters:  09/23/18 115.7 kg (255 lb)  09/09/18 117.5 kg (259 lb)  07/31/18 120.7 kg (266 lb)      ASSESSMENT AND PLAN:  1. Atrial fibrillation/flutter: Paroxysmal.  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 She was admitted in 1/18 with atrial fibrillation/RVR and CHF.  She was diuresed and had DCCV back to NSR.  She had atrial fibrillation ablation in 4/18.  In 5/19, she went into atrial fibrillation transiently.  In 10/19, she was back in atrial fibrillation and was admitted for Tikosyn initiation and cardioversion.   - Continue Eliquis.  CBC with next labs.  - Continue Tikosyn. She will come by the office for ECG and BMET/Mg level.  2.  Claudication: Left leg claudication with absent left PT pulse.  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   3. Chronic systolic => diastolic CHF: EF 70-17% on echo in 1/18, down from 50-55% in 4/15.  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  NYHA class II symptoms, she is not volume overloaded by video exam and weight is down.  - Continue Lasix 60 mg bid.    - Continue Toprol XL 25 mg bid.  - Continue Entresto 49/51 bid  - ?rash from spironolactone, now on eplerenone 25 mg daily.    4. CAD: Coronary CTA showed coronary calcium score in the 82nd percentile with nonobstructive coronary disease.  - not on ASA 81 as she is taking Eliqius.  - Continue Crestor.   5. COPD: She is no longer smoking.  6. Hyperlipidemia: LDL at goal on Crestor.  Triglycerides elevated at last check and Vascepa started.    - Repeat lipid panel with next labs.      COVID screen The patient does not have any symptoms that suggest any further testing/ screening at this time.  Social distancing reinforced today.  Patient Risk: After full review of this patients clinical status, I feel that they are at moderate risk for cardiac decompensation at this time.  Relevant cardiac medications were reviewed at length with the patient today. The patient does not have concerns regarding their medications at this time.   Recommended follow-up:  6 months  Today, I have spent 18 minutes with the patient with telehealth technology discussing the above issues .    Signed, [name], MD  12/03/2018  [place] [address] [facility] [place] [address] [phone] (office) [phone] (fax)

## 2018-12-07 ENCOUNTER — Other Ambulatory Visit (HOSPITAL_COMMUNITY): Payer: Self-pay | Admitting: Cardiology

## 2018-12-18 ENCOUNTER — Ambulatory Visit (HOSPITAL_COMMUNITY)
Admission: RE | Admit: 2018-12-18 | Discharge: 2018-12-18 | Disposition: A | Discharge status: Home / Self Care | Payer: Medicare Other | Source: Ambulatory Visit | Attending: Internal Medicine | Admitting: Internal Medicine

## 2018-12-18 ENCOUNTER — Other Ambulatory Visit: Payer: Self-pay

## 2018-12-18 DIAGNOSIS — I4819 Other persistent atrial fibrillation: Secondary | ICD-10-CM

## 2018-12-18 DIAGNOSIS — I5022 Chronic systolic (congestive) heart failure: Secondary | ICD-10-CM

## 2018-12-18 DIAGNOSIS — E785 Hyperlipidemia, unspecified: Secondary | ICD-10-CM

## 2018-12-18 LAB — BASIC METABOLIC PANEL
Anion gap: 14 (ref 5–15)
BUN: 18 mg/dL (ref 8–23)
CO2: 26 mmol/L (ref 22–32)
Calcium: 8.6 mg/dL — ABNORMAL LOW (ref 8.9–10.3)
Chloride: 98 mmol/L (ref 98–111)
Creatinine, Ser: 1.06 mg/dL — ABNORMAL HIGH (ref 0.44–1.00)
GFR calc Af Amer: 59 mL/min — ABNORMAL LOW (ref >60)
GFR calc non Af Amer: 51 mL/min — ABNORMAL LOW (ref >60)
Glucose, Bld: 141 mg/dL — ABNORMAL HIGH (ref 70–99)
Potassium: 4.1 mmol/L (ref 3.5–5.1)
Sodium: 138 mmol/L (ref 135–145)

## 2018-12-18 LAB — LIPID PANEL
Cholesterol: 203 mg/dL — ABNORMAL HIGH (ref 0–200)
HDL: 30 mg/dL — ABNORMAL LOW (ref >40)
LDL Cholesterol: 95 mg/dL (ref 0–99)
Total CHOL/HDL Ratio: 6.8 RATIO
Triglycerides: 389 mg/dL — ABNORMAL HIGH (ref <150)
VLDL: 78 mg/dL — ABNORMAL HIGH (ref 0–40)

## 2018-12-18 LAB — CBC
HCT: 43.6 % (ref 36.0–46.0)
Hemoglobin: 14 g/dL (ref 12.0–15.0)
MCH: 30.6 pg (ref 26.0–34.0)
MCHC: 32.1 g/dL (ref 30.0–36.0)
MCV: 95.4 fL (ref 80.0–100.0)
Platelets: 299 10*3/uL (ref 150–400)
RBC: 4.57 MIL/uL (ref 3.87–5.11)
RDW: 15.2 % (ref 11.5–15.5)
WBC: 8.1 10*3/uL (ref 4.0–10.5)
nRBC: 0 % (ref 0.0–0.2)

## 2018-12-18 LAB — MAGNESIUM: Magnesium: 2.4 mg/dL (ref 1.7–2.4)

## 2018-12-19 ENCOUNTER — Other Ambulatory Visit (HOSPITAL_COMMUNITY): Payer: Self-pay | Admitting: Cardiology

## 2018-12-19 ENCOUNTER — Telehealth (HOSPITAL_COMMUNITY): Payer: Self-pay

## 2018-12-19 ENCOUNTER — Other Ambulatory Visit: Payer: Self-pay | Admitting: Orthopedic Surgery

## 2018-12-19 ENCOUNTER — Other Ambulatory Visit: Payer: Self-pay | Admitting: Family Medicine

## 2018-12-19 DIAGNOSIS — M25572 Pain in left ankle and joints of left foot: Secondary | ICD-10-CM

## 2018-12-19 MED ORDER — FENOFIBRATE 160 MG PO TABS: 160.0000 mg | ORAL_TABLET | Freq: Every day | ORAL | 0 refills | Status: DC

## 2018-12-19 NOTE — Telephone Encounter (Signed)
-----   Message from Larey Dresser, MD sent at 12/19/2018  1:00 PM EDT ----- She can try pravastatin 80 mg daily with lipids/LFTs in 2 months.  If she has side effects with this, will need Repatha from lipid clinic.

## 2018-12-19 NOTE — Telephone Encounter (Signed)
Medication refilled and patient notified to call back and make 6 month follow up appointment.

## 2018-12-19 NOTE — Telephone Encounter (Signed)
LVM to call back for med changes and needs 2 month lab follow up

## 2018-12-19 NOTE — Telephone Encounter (Signed)
Copied from [place] [id number]. Topic: Quick Communication - Rx Refill/Question >> Dec 19, 2018 11:30 AM Celene Kras A wrote: Medication: fenofibrate 160 MG tablet  Has the patient contacted their pharmacy? Yes.  Pt called stating she has been in contact with pharmacy regarding medication and they sent over 2 fax. Pt is requesting to receive 90 day supply instead of 30 day. Please advise.  (Agent: If no, request that the patient contact the pharmacy for the refill.) (Agent: If yes, when and what did the pharmacy advise?)  Preferred Pharmacy (with phone number or street name): [id number] [address], [place] [address] Phone: [phone] Fax: [phone] Not a 24 hour pharmacy; exact hours not known.    Agent: Please be advised that RX refills may take up to 3 business days. We ask that you follow-up with your pharmacy.

## 2018-12-19 NOTE — Addendum Note (Signed)
Addended by: Kem Boroughs D on: 12/19/2018 02:49 PM   Modules accepted: Orders

## 2018-12-20 ENCOUNTER — Other Ambulatory Visit (HOSPITAL_COMMUNITY): Payer: Self-pay

## 2018-12-20 DIAGNOSIS — I5022 Chronic systolic (congestive) heart failure: Secondary | ICD-10-CM

## 2018-12-20 MED ORDER — PRAVASTATIN SODIUM 80 MG PO TABS: 80.0000 mg | ORAL_TABLET | Freq: Every evening | ORAL | 3 refills | Status: DC

## 2018-12-20 NOTE — Progress Notes (Signed)
D/c'd Crestor per DM due to pt c/o 'muscle ache', switched to Pravastatin..repeat labs scheduled in 2 months.

## 2019-01-06 ENCOUNTER — Ambulatory Visit
Admission: RE | Admit: 2019-01-06 | Discharge: 2019-01-06 | Disposition: A | Discharge status: Home / Self Care | Payer: Medicare Other | Source: Ambulatory Visit | Attending: Orthopedic Surgery | Admitting: Orthopedic Surgery

## 2019-01-06 ENCOUNTER — Other Ambulatory Visit: Payer: Self-pay

## 2019-01-06 DIAGNOSIS — M25572 Pain in left ankle and joints of left foot: Secondary | ICD-10-CM

## 2019-01-15 ENCOUNTER — Other Ambulatory Visit (HOSPITAL_COMMUNITY): Payer: Self-pay | Admitting: Nurse Practitioner

## 2019-01-15 DIAGNOSIS — E876 Hypokalemia: Secondary | ICD-10-CM

## 2019-01-20 ENCOUNTER — Other Ambulatory Visit (HOSPITAL_COMMUNITY): Payer: Self-pay | Admitting: Cardiology

## 2019-01-31 ENCOUNTER — Other Ambulatory Visit (HOSPITAL_COMMUNITY): Payer: Self-pay | Admitting: Cardiology

## 2019-01-31 ENCOUNTER — Telehealth: Payer: Self-pay | Admitting: Family Medicine

## 2019-01-31 NOTE — Telephone Encounter (Signed)
Attempted to call patient to schedule AWV, but no answer. Will try to call patient back at a later time. SF

## 2019-02-19 ENCOUNTER — Other Ambulatory Visit: Payer: Self-pay

## 2019-02-19 ENCOUNTER — Ambulatory Visit (HOSPITAL_COMMUNITY)
Admission: RE | Admit: 2019-02-19 | Discharge: 2019-02-19 | Disposition: A | Discharge status: Home / Self Care | Payer: Medicare Other | Source: Ambulatory Visit | Attending: Cardiology | Admitting: Cardiology

## 2019-02-19 DIAGNOSIS — I5022 Chronic systolic (congestive) heart failure: Secondary | ICD-10-CM | POA: Insufficient documentation

## 2019-02-19 LAB — HEPATIC FUNCTION PANEL
ALT: 19 U/L (ref 0–44)
AST: 24 U/L (ref 15–41)
Albumin: 3.4 g/dL — ABNORMAL LOW (ref 3.5–5.0)
Alkaline Phosphatase: 34 U/L — ABNORMAL LOW (ref 38–126)
Bilirubin, Direct: 0.1 mg/dL (ref 0.0–0.2)
Indirect Bilirubin: 0.7 mg/dL (ref 0.3–0.9)
Total Bilirubin: 0.8 mg/dL (ref 0.3–1.2)
Total Protein: 6.9 g/dL (ref 6.5–8.1)

## 2019-02-19 LAB — LIPID PANEL
Cholesterol: 171 mg/dL (ref 0–200)
HDL: 28 mg/dL — ABNORMAL LOW (ref >40)
LDL Cholesterol: 64 mg/dL (ref 0–99)
Total CHOL/HDL Ratio: 6.1 RATIO
Triglycerides: 397 mg/dL — ABNORMAL HIGH (ref <150)
VLDL: 79 mg/dL — ABNORMAL HIGH (ref 0–40)

## 2019-02-21 ENCOUNTER — Other Ambulatory Visit (HOSPITAL_COMMUNITY): Payer: Self-pay

## 2019-02-21 ENCOUNTER — Telehealth (HOSPITAL_COMMUNITY): Payer: Self-pay

## 2019-02-21 DIAGNOSIS — I5022 Chronic systolic (congestive) heart failure: Secondary | ICD-10-CM

## 2019-02-21 MED ORDER — FENOFIBRATE 145 MG PO TABS: 145.0000 mg | ORAL_TABLET | Freq: Every day | ORAL | 3 refills | Status: DC

## 2019-02-21 NOTE — Telephone Encounter (Signed)
Relayed message to pt, verbalized understanding and script sent to confirmed pharmacy. Will have repeat lipids at Dr Horace Porteous in Nov

## 2019-02-21 NOTE — Telephone Encounter (Signed)
-----   Message from Larey Dresser, MD sent at 02/20/2019  9:55 PM EDT ----- LDL is ok but triglycerides still high.  Add fenofibrate 145 mg daily.  Lipids 2 months.

## 2019-03-04 ENCOUNTER — Ambulatory Visit: Payer: Medicare Other | Admitting: Cardiology

## 2019-03-05 ENCOUNTER — Other Ambulatory Visit: Payer: Self-pay | Admitting: Cardiology

## 2019-03-07 ENCOUNTER — Telehealth: Payer: Self-pay | Admitting: Cardiology

## 2019-03-07 NOTE — Telephone Encounter (Signed)
Per pt call her pharmacy told her that her ELIQUES refill was rejected.  Pt would like to know why please give her a call back.

## 2019-03-10 ENCOUNTER — Other Ambulatory Visit (HOSPITAL_COMMUNITY): Payer: Self-pay

## 2019-03-10 MED ORDER — APIXABAN 5 MG PO TABS: 5.0000 mg | ORAL_TABLET | Freq: Two times a day (BID) | ORAL | 11 refills | Status: DC

## 2019-03-13 NOTE — Telephone Encounter (Signed)
Refill was sent in on 8/24

## 2019-04-16 ENCOUNTER — Telehealth (HOSPITAL_COMMUNITY): Payer: Self-pay

## 2019-04-16 DIAGNOSIS — E785 Hyperlipidemia, unspecified: Secondary | ICD-10-CM

## 2019-04-16 DIAGNOSIS — I5022 Chronic systolic (congestive) heart failure: Secondary | ICD-10-CM

## 2019-04-16 NOTE — Telephone Encounter (Signed)
Pt aware of recommendations. Referral placed for lipid clinic. Advised she will receive a call to arrange an appt. Verbalized understanding

## 2019-04-16 NOTE — Telephone Encounter (Signed)
Pt left a message saying that she had a medication problem to discuss. When called back, patient says that her hair is falling out due to pravastatin.  She spoke with pharmacist and they reviewed al of her medications and noted that hair loss was a side effect of Pravastatin.  Pt states that she has stopped taking it.  Is there an alternative you would like to change her to.  Routed to MD

## 2019-04-16 NOTE — Telephone Encounter (Signed)
Has had problems with more than 1 statin now.  Refer to lipid clinic to get Repatha.

## 2019-04-27 NOTE — Progress Notes (Signed)
Patient ID: Heidi Stark                 DOB: [birth year]                    MRN: [id number]     HPI: Heidi Stark is a 78 y.o. female patient referred to lipid clinic by Dr. Aundra Stark. PMH is significant for CHF (recovered EF 55%), COPD, HTN, HLD, OSA, afib, and T2DM. Patient was last seen virtually by Dr. Aundra Stark on 12/03/18. Patient had been taking Crestor 10 mg daily with Vascepa 2 g BID prior to lab work in June. With increased LDL and TG, pt was instructed to increase Crestor to 20 mg daily. However, pt had reported she was having muscle pain with Crestor, so was ultimately switched to pravastatin. Fenofibate 145 mg daily added on 8/7 after repeat lipid panel. Pt then began having hair loss with pravastatin and self-discontinued it in September.   Patient called today for virtual visit regarding lipids. She expressed that she was having "clumps of hair falling off my head like never before." She called her local pharmacist who did medication review and stated that the pravastatin had a low chance of causing hair loss. Pt says her hair loss has improved since stopping the pravastatin. She states she never had this issue before on Crestor. Pt recalled having some muscle pain with her Crestor but stated when she separated out some of her medications to morning and evening, this resolved.   Pt reports smoking 10 cigarettes per day. She states she has tried to quit without NRT in the past and has been unsuccessful. She says she's interested in assistance with quitting smoking.  Current Medications: fenofibrate 145 mg daily, Vascepa 2 g BID  Intolerances: pravastatin (hair loss), Crestor (myalgia)  Risk Factors: HTN, HLD, afib, T2DM  LDL goal: <70  Diet: No longer eating fried foods. Eats oatmeal with fruit, cup of coffee with sugar free creamer for breakfast. For late lunch/early dinner she usually has salad, tuna salad, slaw, meat, carrots. Drinks water with crystal light.   Exercise: not able to  do a lot of exercise, she says she has a blockage in her left leg (had procedure involving iliac artery in the 1990s) and then tore her achilles tendon in the left leg  Family History: Cancer in her mother; Diabetes in her father, mother; Factor V Leiden deficiency in her daughter; Hypertension in her mother; Obesity in her father and mother.   Social History: no alcohol, current smoker ("on and off again") about 10 cigarettes per day  Labs: 02/19/19: TC 171, TG 397, HDL 28, LDL 64 - pravastatin 80 mg daily, Vascepa 12/18/18: TC 203, TG 389, HDL 30, LDL 78 - Vascepa + crestor 10 mg daily 09/09/18: TC 150, TG 362, HDL 32, LDL 46  Past Medical History:  Diagnosis Date  . A-fib ([place])   . Arthritis    "hands, wrists" (11/07/2016)  . CHF (congestive heart failure) ([place])   . Chronic lower back pain   . Constipation   . COPD (chronic obstructive pulmonary disease) ([place])   . DVT (deep venous thrombosis) ([place])   . Dyspnea   . History of blood transfusion 1990   "related to OR"  . History of kidney stones   . Hyperlipidemia   . Hypertension   . Joint pain   . Lower extremity edema   . OSA on CPAP   . Persistent atrial fibrillation   .  Pneumonia    "several times" (11/07/2016)  . Type II diabetes mellitus ([place])     Current Outpatient Medications on File Prior to Visit  Medication Sig Dispense Refill  . apixaban (ELIQUIS) 5 MG TABS tablet Take 1 tablet (5 mg total) by mouth 2 (two) times daily. 60 tablet 11  . Canagliflozin-metFORMIN HCl (INVOKAMET) [phone] MG TABS Take 1 tablet by mouth 2 (two) times daily.    Marland Kitchen dofetilide (TIKOSYN) 125 MCG capsule Take 1 capsule (125 mcg total) by mouth 2 (two) times daily. 30 capsule 6  . ENTRESTO 49-51 MG TAKE 1 TABLET BY MOUTH TWICE A DAY 60 tablet 5  . eplerenone (INSPRA) 25 MG tablet TAKE 1 TABLET BY MOUTH EVERY DAY 90 tablet 3  . fenofibrate (TRICOR) 145 MG tablet Take 1 tablet (145 mg total) by mouth daily. 90 tablet 3  . furosemide (LASIX) 40 MG  tablet Take 1.5 tablets (60 mg total) by mouth 2 (two) times daily. 270 tablet 3  . insulin glargine (LANTUS) 100 UNIT/ML injection Inject 15 Units into the skin at bedtime.     . metoprolol succinate (TOPROL-XL) 25 MG 24 hr tablet TAKE 1 TABLET BY MOUTH TWICE A DAY 180 tablet 3  . potassium chloride (KLOR-CON M10) 10 MEQ tablet Take 2 tablets (20 mEq total) by mouth 2 (two) times daily. 360 tablet 2  . VASCEPA 1 g CAPS TAKE 2 CAPSULES BY MOUTH TWICE A DAY 120 capsule 5  . Vitamin D, Ergocalciferol, (DRISDOL) 1.25 MG (50000 UT) CAPS capsule Take 1 capsule (50,000 Units total) by mouth every 3 (three) days. 10 capsule 0   No current facility-administered medications on file prior to visit.     Allergies  Allergen Reactions  . Neomycin-Bacitracin Zn-Polymyx Rash  . Sulfonamide Derivatives Other (See Comments)    Stomach cramps  . Ciprofloxacin Hives, Itching and Rash  . Spironolactone Rash    Assessment/Plan:  1. Hyperlipidemia - TG are above goal (goal < 150) and last LDL is within goal (goal < 70) but pravastatin was self-discontinued after this lab was collected. Continue Vascepa 2 g BID and fenofibrate 145 mg daily. She states she'd be willing to try Crestor again, especially since her LDL is borderline and may increase to above goal now being off statin therapy and she no longer had issues with myalgias after adjusting the timing of her medications. Restart Crestor 10 mg once daily.   2. Tobacco Abuse - Educated pt about the products available for smoking cessation. Pt decided that nicotine patches may work best for her. Start nicotine patch 14 mg once daily x 6 weeks, then decrease down to 7 mg patch once daily x 2 weeks.   Follow up lipid panel around next appointment in November. Pt states she will call Dr. Curt Stark office to try to move 3pm appointment up in the morning so she only needs to come to the office once for lab work (needs to be fasting) and for her visit with Dr. Curt Stark.      Heidi Stark, PharmD PGY2 Cardiology Pharmacy Resident [place] [id number], [place], [place] [address] Phone: [phone]; Fax: [phone]

## 2019-04-28 ENCOUNTER — Telehealth (INDEPENDENT_AMBULATORY_CARE_PROVIDER_SITE_OTHER): Payer: Medicare Other

## 2019-04-28 ENCOUNTER — Ambulatory Visit: Payer: Medicare Other

## 2019-04-28 ENCOUNTER — Other Ambulatory Visit: Payer: Self-pay

## 2019-04-28 DIAGNOSIS — Z720 Tobacco use: Secondary | ICD-10-CM

## 2019-04-28 DIAGNOSIS — E785 Hyperlipidemia, unspecified: Secondary | ICD-10-CM

## 2019-04-28 MED ORDER — NICOTINE 14 MG/24HR TD PT24: 14.0000 mg | MEDICATED_PATCH | Freq: Every day | TRANSDERMAL | 2 refills | Status: DC

## 2019-04-28 MED ORDER — ROSUVASTATIN CALCIUM 10 MG PO TABS: 10.0000 mg | ORAL_TABLET | Freq: Every day | ORAL | 5 refills | Status: DC

## 2019-04-28 NOTE — Patient Instructions (Addendum)
Please continue taking Vascepa and fenofibrate as prescribed.  Start taking Crestor 10 mg once daily - this has been sent to your pharmacy.   Next appt with Dr. [name] on 11/24. Will need labs around this appointment. Call to see if you can push appointment up to the morning or we can schedule lab work on Monday, 11/23 in the morning. Please do not eat the morning of your lab work.  Please call [phone] if you have any questions or concerns.

## 2019-04-29 ENCOUNTER — Telehealth: Payer: Medicare Other

## 2019-05-05 ENCOUNTER — Telehealth: Payer: Self-pay

## 2019-05-05 MED ORDER — LIVALO 2 MG PO TABS: 2.0000 mg | ORAL_TABLET | Freq: Every day | ORAL | 2 refills | Status: DC

## 2019-05-05 NOTE — Telephone Encounter (Signed)
Called pt about adverse reaction with Crestor. Pt said she experienced extreme leg pain after only one dose of the medication. Will not try Crestor three times weekly regimen given severity of symptoms after only one dose. She is willing to try one more statin before pursuing non-statin options. Will start Livalo 2 mg once daily. Instructed to start this after muscle pain in legs has completely resolved. Will call patient in 1 week to discuss tolerability.

## 2019-05-05 NOTE — Telephone Encounter (Signed)
Pt c/o medication issue:  1. Name of Medication: rosuvastatin (CRESTOR) 10 MG tablet  2. How are you currently taking this medication (dosage and times per day)? 10 mg once daily  3. Are you having a reaction (difficulty breathing--STAT)? no  4. What is your medication issue? Patient states the medication is causing excruciating leg pain.   She was on this medication before, but was taken off around the first of the year. The medication that she was placed on to replace this caused her hair to fall out. She wants to know if there is an alternate medication for her

## 2019-05-19 ENCOUNTER — Telehealth (HOSPITAL_COMMUNITY): Payer: Self-pay | Admitting: Licensed Clinical Social Worker

## 2019-05-19 NOTE — Telephone Encounter (Signed)
CSW received notification that patient is now able to reapply for [employer] assistance with Entresto.  CSW called pt to confirm they had no other form of assistance at this time- pt states they have no other assistance and is agreeable to applying for [employer] grant.  CSW confirmed all of patients information was still correct and submitted application for review.  Will receive an email within 4 days with determination  CSW will continue to follow and assist as needed  Johnnie Moten H. Timberly Yott, LCSW Clinical Social Worker [facility] Desk#: [phone] Cell#: [phone]  

## 2019-05-21 ENCOUNTER — Telehealth (HOSPITAL_COMMUNITY): Payer: Self-pay | Admitting: Licensed Clinical Social Worker

## 2019-05-21 ENCOUNTER — Institutional Professional Consult (permissible substitution): Payer: Medicare Other | Admitting: Pulmonary Disease

## 2019-05-21 NOTE — Telephone Encounter (Signed)
Pt approved for Entresto assistance through [employer] foundation:  Patient ID Number: [id number] . Assistance Program: Heart Failure . Group Number: [id number] . Patient assistance starts on: 02/20/2019 and continues until 02/19/2020 . Expenses can be submitted until: Monday, April 19, 2020 . Amount of Assistance Available: 1,000.00 . Covered Services: Entresto (sacubitril/valsartan) . Application Submitted by: Georgiann Mohs, LCSW Clinical Social Worker [facility] Desk#: [phone] Cell#: [phone]

## 2019-05-23 ENCOUNTER — Telehealth (HOSPITAL_COMMUNITY): Payer: Self-pay | Admitting: Licensed Clinical Social Worker

## 2019-05-23 NOTE — Telephone Encounter (Signed)
Clinic received notification of pt approval for [employer] foundation but then received 2nd notification that pt income that we reported did not match with their financial records.  CSW called [employer] to clarify if pt was approved or is needing income verification.  [employer] representative states that pt has access to the grant for 15 days while she submits income verification- if they do not receive this verification during that time they will discontinue to the grant.   CSW called pt to inform- they will send in their tax return for review.  Jorge Ny, LCSW Clinical Social Worker [facility] Desk#: [phone] Cell#: [phone]

## 2019-06-01 NOTE — Progress Notes (Signed)
Electrophysiology Office Note   Date:  06/02/2019   ID:  Heidi Stark October 06, 1940, MRN [id number]  PCP:  Heidi Stark, Heidi Apa, DO  Cardiologist:  [name] Primary Electrophysiologist:  Dr [name]  CC: Follow up for atrial fibrillation   History of Present Illness: Heidi Stark is a 78 y.o. female who is being seen today for the evaluation of atrial fibrillatoin at the request of Heidi Stark, Heidi Stark, *. Presenting today for electrophysiology evaluation.  She has a history of diastolic heart failure, COPD, DVT, hypertension, hyperlipidemia, OSA on CPAP, paroxysmal atrial fibrillation, type 2 diabetes.  She wears oxygen to sleep at night.  She was admitted on 4/15 with splenic and renal infarcts.  She was noted to have paroxysmal atrial fibrillation.  She was started on Coumadin at that time.  She was noted to have left leg pain with ambulation of about 100 feet.  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  On 1/18 she developed L potation's and exertional dyspnea.  She was found to be in atrial fibrillation with rapid rates and acute systolic heart failure.  Her ejection fraction was 30 to 35% down from normal in 2015.  She was diuresed and underwent cardioversion.  She had an atrial fibrillation ablation 10/2016.  She was seen in clinic 11/2017 and was found to be in atrial fibrillation and is status post cardioversion.  She was admitted 04/30/2018 for dofetilide loading.  Today, denies symptoms of palpitations, chest pain, shortness of breath, orthopnea, PND, lower extremity edema, claudication, dizziness, presyncope, syncope, bleeding, or neurologic sequela. The patient is tolerating medications without difficulties.  Overall she is doing well.  She has no chest pain or shortness of breath.  She fortunately remains in sinus rhythm and has had no further episodes of atrial fibrillation since dofetilide loading.   Past Medical History:  Diagnosis Date  .  A-fib ([place])   . Arthritis    "hands, wrists" (11/07/2016)  . CHF (congestive heart failure) ([place])   . Chronic lower back pain   . Constipation   . COPD (chronic obstructive pulmonary disease) ([place])   . DVT (deep venous thrombosis) ([place])   . Dyspnea   . History of blood transfusion 1990   "related to OR"  . History of kidney stones   . Hyperlipidemia   . Hypertension   . Joint pain   . Lower extremity edema   . OSA on CPAP   . Persistent atrial fibrillation ([place])   . Pneumonia    "several times" (11/07/2016)  . Type II diabetes mellitus ([place])    Past Surgical History:  Procedure Laterality Date  . ATRIAL FIBRILLATION ABLATION N/A 11/07/2016   Procedure: Atrial Fibrillation Ablation;  Surgeon: Pablo Mathurin [name], MD;  Location: [facility];  Service: Cardiovascular;  Laterality: N/A;  . BACK SURGERY    . CARDIAC CATHETERIZATION    . CARDIOVERSION N/A 08/11/2016   Procedure: CARDIOVERSION;  Surgeon: Larey Dresser, MD;  Location: [facility] ENDOSCOPY;  Service: Cardiovascular;  Laterality: N/A;  . CARDIOVERSION N/A 12/03/2017   Procedure: CARDIOVERSION;  Surgeon: Larey Dresser, MD;  Location: [facility] ENDOSCOPY;  Service: Cardiovascular;  Laterality: N/A;  . CARDIOVERSION N/A 05/02/2018   Procedure: CARDIOVERSION;  Surgeon: Pixie Casino, MD;  Location: [facility] ENDOSCOPY;  Service: Cardiovascular;  Laterality: N/A;  . CARPAL TUNNEL RELEASE    . CATARACT EXTRACTION W/ INTRAOCULAR LENS  IMPLANT, BILATERAL Bilateral   . COLON SURGERY  1990  vein graft and colon repair after nicked artery with back surgert  . COLONOSCOPY    . CYSTECTOMY     between bladder and kidneys  . GANGLION CYST EXCISION Left   . KNEE CARTILAGE SURGERY Right 1960s  . [place] SURGERY  1990  . REPAIR ILIAC ARTERY  1990   vein graft and colon repair after nicked artery with back surgert  . TEE WITHOUT CARDIOVERSION N/A 10/31/2013   Procedure: TRANSESOPHAGEAL ECHOCARDIOGRAM (TEE);  Surgeon: Larey Dresser, MD;   Location: [facility];  Service: Cardiovascular;  Laterality: N/A;  . TUBAL LIGATION       Current Outpatient Medications  Medication Sig Dispense Refill  . apixaban (ELIQUIS) 5 MG TABS tablet Take 1 tablet (5 mg total) by mouth 2 (two) times daily. 60 tablet 11  . Canagliflozin-metFORMIN HCl (INVOKAMET) [phone] MG TABS Take 1 tablet by mouth 2 (two) times daily.    Marland Kitchen dofetilide (TIKOSYN) 125 MCG capsule Take 1 capsule (125 mcg total) by mouth 2 (two) times daily. 30 capsule 6  . ENTRESTO 49-51 MG TAKE 1 TABLET BY MOUTH TWICE A DAY 60 tablet 5  . eplerenone (INSPRA) 25 MG tablet TAKE 1 TABLET BY MOUTH EVERY DAY 90 tablet 3  . fenofibrate (TRICOR) 145 MG tablet Take 1 tablet (145 mg total) by mouth daily. 90 tablet 3  . furosemide (LASIX) 40 MG tablet Take 1.5 tablets (60 mg total) by mouth 2 (two) times daily. 270 tablet 3  . insulin glargine (LANTUS) 100 UNIT/ML injection Inject 15 Units into the skin at bedtime.     . metoprolol succinate (TOPROL-XL) 25 MG 24 hr tablet TAKE 1 TABLET BY MOUTH TWICE A DAY 180 tablet 3  . nicotine (NICODERM CQ - DOSED IN MG/24 HOURS) 14 mg/24hr patch Place 1 patch (14 mg total) onto the skin daily. 28 patch 2  . Pitavastatin Calcium (LIVALO) 2 MG TABS Take 1 tablet (2 mg total) by mouth daily. 30 tablet 2  . potassium chloride (KLOR-CON M10) 10 MEQ tablet Take 2 tablets (20 mEq total) by mouth 2 (two) times daily. 360 tablet 2  . VASCEPA 1 g CAPS TAKE 2 CAPSULES BY MOUTH TWICE A DAY 120 capsule 5  . Vitamin D, Ergocalciferol, (DRISDOL) 1.25 MG (50000 UT) CAPS capsule Take 1 capsule (50,000 Units total) by mouth every 3 (three) days. 10 capsule 0   No current facility-administered medications for this visit.     Allergies:   Neomycin-bacitracin zn-polymyx, Sulfonamide derivatives, Ciprofloxacin, and Spironolactone   Social History:  The patient  reports that she has quit smoking. Her smoking use included cigarettes. She has a 50.00 pack-year smoking  history. She has never used smokeless tobacco. She reports that she does not drink alcohol or use drugs.   Family History:  The patient's family history includes Cancer in her mother; Diabetes in her father, mother, and another family member; Factor V Leiden deficiency in her daughter; Hypertension in her mother; Melanoma in an other family member; Obesity in her father and mother.   ROS:  Please see the history of present illness.   Otherwise, review of systems is positive for none.   All other systems are reviewed and negative.   PHYSICAL EXAM: VS:  BP 110/60   Pulse 63   Ht 5\' 4"  (1.626 m)   Wt 247 lb (112 kg)   BMI 42.40 kg/m  , BMI Body mass index is 42.4 kg/m. GEN: Well nourished, well developed, in no acute distress  HEENT: normal  Neck: no JVD, carotid bruits, or masses Cardiac: RRR; no murmurs, rubs, or gallops,no edema  Respiratory:  clear to auscultation bilaterally, normal work of breathing GI: soft, nontender, nondistended, + BS MS: no deformity or atrophy  Skin: warm and dry Neuro:  Strength and sensation are intact Psych: euthymic mood, full affect  EKG:  EKG is ordered today. Personal review of the ekg ordered shows sinus rhythm, rate 63, first-degree AV block, PACs  Recent Labs: 09/09/2018: TSH 1.740 12/18/2018: BUN 18; Creatinine, Ser 1.06; Hemoglobin 14.0; Magnesium 2.4; Platelets 299; Potassium 4.1; Sodium 138 02/19/2019: ALT 19    Lipid Panel     Component Value Date/Time   CHOL 171 02/19/2019 1128   CHOL 150 09/09/2018 1326   TRIG 397 (H) 02/19/2019 1128   HDL 28 (L) 02/19/2019 1128   HDL 32 (L) 09/09/2018 1326   CHOLHDL 6.1 02/19/2019 1128   VLDL 79 (H) 02/19/2019 1128   LDLCALC 64 02/19/2019 1128   LDLCALC 46 09/09/2018 1326   LDLCALC 77 06/28/2018 1410   LDLDIRECT 42.0 11/30/2014 1226     Wt Readings from Last 3 Encounters:  06/02/19 247 lb (112 kg)  12/18/18 268 lb (121.6 kg)  09/23/18 255 lb (115.7 kg)      Other studies Reviewed:  Additional studies/ records that were reviewed today include: TTE 12/21/17  Review of the above records today demonstrates:  - Left ventricle: The cavity size was normal. Wall thickness was   increased in a pattern of mild LVH. The estimated ejection   fraction was 55%. Although no diagnostic regional wall motion   abnormality was identified, this possibility cannot be completely   excluded on the basis of this study. Features are consistent with   a pseudonormal left ventricular filling pattern, with concomitant   abnormal relaxation and increased filling pressure (grade 2   diastolic dysfunction). - Aortic valve: There was no stenosis. - Mitral valve: Moderately fibrotic annulus. There was no   significant regurgitation. - Left atrium: The atrium was moderately dilated. - Right ventricle: The cavity size was normal. Systolic function   was normal. - Right atrium: The atrium was mildly dilated. - Tricuspid valve: Peak RV-RA gradient (S): 16 mm Hg. - Pulmonary arteries: PA peak pressure: 19 mm Hg (S). - Inferior vena cava: The vessel was normal in size. The   respirophasic diameter changes were in the normal range (>= 50%),   consistent with normal central venous pressure.   ASSESSMENT AND PLAN:  1.  Paroxysmal atrial fibrillation: Currently on Eliquis and dofetilide.  Remains in sinus rhythm no changes.  This patients CHA2DS2-VASc Score and unadjusted Ischemic Stroke Rate (% per year) is equal to 9.7 % stroke rate/year from a score of 6  Above score calculated as 1 point each if present [CHF, HTN, DM, Vascular=MI/PAD/Aortic Plaque, Age if 65-74, or Female] Above score calculated as 2 points each if present [Age > 75, or Stroke/TIA/TE]   2.  Chronic systolic heart failure: Ejection fraction improved.  Currently on Toprol-XL, eplerenone, Lasix, Entresto.  No obvious volume overload.  3.  Coronary artery disease: Calcium score in the 82nd percentile.  Continue Crestor  4.  COPD:  Advised to quit smoking  5. OSA CPAP compliance encouraged   Current medicines are reviewed at length with the patient today.   The patient does not have concerns regarding her medicines.  The following changes were made today: None  Labs/ tests ordered today include:  Orders Placed This  Encounter  Procedures  . EKG 12-Lead     Disposition:   FU with Kashus Karlen 12 months  Signed, Israel Wunder [name], MD  06/02/2019 9:01 AM     [facility] [address] [place] [address] [phone] (office) [phone] (fax)

## 2019-06-02 ENCOUNTER — Encounter: Payer: Self-pay | Admitting: Cardiology

## 2019-06-02 ENCOUNTER — Ambulatory Visit: Payer: Medicare Other | Admitting: Cardiology

## 2019-06-02 ENCOUNTER — Other Ambulatory Visit: Payer: Self-pay

## 2019-06-02 ENCOUNTER — Other Ambulatory Visit: Payer: Medicare Other

## 2019-06-02 VITALS — BP 110/60 | HR 63 | Ht 64.0 in | Wt 247.0 lb

## 2019-06-02 DIAGNOSIS — Z79899 Other long term (current) drug therapy: Secondary | ICD-10-CM

## 2019-06-02 DIAGNOSIS — I2510 Atherosclerotic heart disease of native coronary artery without angina pectoris: Secondary | ICD-10-CM | POA: Diagnosis not present

## 2019-06-02 DIAGNOSIS — I4819 Other persistent atrial fibrillation: Secondary | ICD-10-CM

## 2019-06-02 LAB — LIPID PANEL
Chol/HDL Ratio: 5.5 ratio — ABNORMAL HIGH (ref 0.0–4.4)
Cholesterol, Total: 170 mg/dL (ref 100–199)
HDL: 31 mg/dL — ABNORMAL LOW (ref >39)
LDL Chol Calc (NIH): 92 mg/dL (ref 0–99)
Triglycerides: 282 mg/dL — ABNORMAL HIGH (ref 0–149)
VLDL Cholesterol Cal: 47 mg/dL — ABNORMAL HIGH (ref 5–40)

## 2019-06-02 NOTE — Addendum Note (Signed)
Addended by: Stanton Kidney on: 06/02/2019 09:10 AM   Modules accepted: Orders

## 2019-06-02 NOTE — Patient Instructions (Signed)
Medication Instructions:  Your physician recommends that you continue on your current medications as directed. Please refer to the Current Medication list given to you today.  * If you need a refill on your cardiac medications before your next appointment, please call your pharmacy.   Labwork: Today: Fasting lipid panel If you have labs (blood work) drawn today and your tests are completely normal, you will receive your results only by:  [place] (if you have MyChart) OR  A paper copy in the mail If you have any lab test that is abnormal or we need to change your treatment, we will call you to review the results.  Testing/Procedures: None ordered  Follow-Up: Your physician wants you to follow-up in: 6 months with Dr. [name]. You will receive a reminder letter in the mail two months in advance. If you don't receive a letter, please call our office to schedule the follow-up appointment.  At [facility], you and your health needs are our priority.  As part of our continuing mission to provide you with exceptional heart care, we have created designated Provider Care Teams.  These Care Teams include your primary Cardiologist (physician) and Advanced Practice Providers (APPs -  Physician Assistants and Nurse Practitioners) who all work together to provide you with the care you need, when you need it.  You will need a follow up appointment in 1 year.  Please call our office 2 months in advance to schedule this appointment.  You may see Dr [name] or one of the following Advanced Practice Providers on your designated Care Team:    Chanetta Marshall, NP  [name], PA-C  Oda Kilts, [place]  Thank you for choosing [facility]!!   Trinidad Curet, RN [phone]  Any Other Special Instructions Will Be Listed Below (If Applicable).

## 2019-06-10 ENCOUNTER — Ambulatory Visit: Payer: Medicare Other | Admitting: Cardiology

## 2019-06-11 ENCOUNTER — Telehealth: Payer: Self-pay

## 2019-06-11 DIAGNOSIS — E785 Hyperlipidemia, unspecified: Secondary | ICD-10-CM

## 2019-06-11 MED ORDER — LIVALO 4 MG PO TABS: 4.0000 mg | ORAL_TABLET | Freq: Every day | ORAL | 3 refills | Status: DC

## 2019-06-11 NOTE — Telephone Encounter (Signed)
Called pt to discuss lipids. LDL is above goal at 92 with Livalo 2 mg once daily. Goal LDL < 70. Pt reports no side effects with current dose. Increase Livalo to 4 mg once daily (she can take 2 tablets of current strength until it runs out and then pick up new prescription for 4 mg tablets at the pharmacy). Informed pt to call if unable to tolerate increased dose.   Follow up lipid panel at 70-month visit with Dr. [name] - no appointment scheduled yet.  Pt voiced understanding and will make sure they collect lipids at next visit with Dr. [name].

## 2019-07-03 ENCOUNTER — Other Ambulatory Visit (HOSPITAL_COMMUNITY): Payer: Self-pay | Admitting: Cardiology

## 2019-07-04 ENCOUNTER — Other Ambulatory Visit (HOSPITAL_COMMUNITY): Payer: Self-pay | Admitting: Cardiology

## 2019-07-29 ENCOUNTER — Other Ambulatory Visit: Payer: Self-pay | Admitting: Cardiology

## 2019-08-08 ENCOUNTER — Other Ambulatory Visit (HOSPITAL_COMMUNITY): Payer: Self-pay | Admitting: *Deleted

## 2019-08-08 MED ORDER — ICOSAPENT ETHYL 1 G PO CAPS: 2.0000 g | ORAL_CAPSULE | Freq: Two times a day (BID) | ORAL | 5 refills | Status: DC

## 2019-08-13 ENCOUNTER — Ambulatory Visit: Payer: Medicare Other

## 2019-08-22 ENCOUNTER — Ambulatory Visit: Payer: Medicare Other

## 2019-09-28 ENCOUNTER — Other Ambulatory Visit (HOSPITAL_COMMUNITY): Payer: Self-pay | Admitting: Cardiology

## 2019-09-29 ENCOUNTER — Other Ambulatory Visit: Payer: Self-pay | Admitting: Cardiology

## 2019-09-29 MED ORDER — DOFETILIDE 125 MCG PO CAPS: 125.0000 ug | ORAL_CAPSULE | Freq: Two times a day (BID) | ORAL | 7 refills | Status: DC

## 2019-09-29 NOTE — Telephone Encounter (Signed)
Pt's medication was sent to pt's pharmacy as requested. Confirmation received.  °

## 2019-09-29 NOTE — Telephone Encounter (Signed)
Please contact patient. Needs appt with DM

## 2019-10-10 ENCOUNTER — Other Ambulatory Visit (HOSPITAL_COMMUNITY): Payer: Self-pay | Admitting: Nurse Practitioner

## 2019-10-10 DIAGNOSIS — E876 Hypokalemia: Secondary | ICD-10-CM

## 2019-10-21 ENCOUNTER — Encounter: Payer: Self-pay | Admitting: Family Medicine

## 2019-10-21 ENCOUNTER — Other Ambulatory Visit: Payer: Self-pay

## 2019-10-21 ENCOUNTER — Ambulatory Visit (INDEPENDENT_AMBULATORY_CARE_PROVIDER_SITE_OTHER): Payer: Medicare Other | Admitting: Family Medicine

## 2019-10-21 VITALS — Temp 97.1°F | Ht 64.0 in | Wt 244.0 lb

## 2019-10-21 DIAGNOSIS — L02415 Cutaneous abscess of right lower limb: Secondary | ICD-10-CM | POA: Diagnosis not present

## 2019-10-21 MED ORDER — DOXYCYCLINE HYCLATE 100 MG PO TABS: 100.0000 mg | ORAL_TABLET | Freq: Two times a day (BID) | ORAL | 0 refills | Status: DC

## 2019-10-21 NOTE — Progress Notes (Signed)
Virtual Visit via Video Note  I connected with [name] on 10/21/19 at  1:40 PM EDT by a video enabled telemedicine application and verified that I am speaking with the correct person using two identifiers.  Location: Patient: home alone  Provider: office    I discussed the limitations of evaluation and management by telemedicine and the availability of in person appointments. The patient expressed understanding and agreed to proceed.  History of Present Illness: Pt is home c/o bump R thigh that is getting worse  Tender to touch No drainage    Observations/Objective: Vitals:   10/21/19 1344  Temp: (!) 97.1 F (36.2 C)   Red area on top of R thigh ---- size of a quarter and is tender to touch  Pt describes center as hard   Assessment and Plan: 1. Abscess of right thigh Warm compresses and abx per orders F/u later this week if no improvement --in the office  - doxycycline (VIBRA-TABS) 100 MG tablet; Take 1 tablet (100 mg total) by mouth 2 (two) times daily.  Dispense: 20 tablet; Refill: 0   Follow Up Instructions:    I discussed the assessment and treatment plan with the patient. The patient was provided an opportunity to ask questions and all were answered. The patient agreed with the plan and demonstrated an understanding of the instructions.   The patient was advised to call back or seek an in-person evaluation if the symptoms worsen or if the condition fails to improve as anticipated.  I provided 30 minutes of non-face-to-face time during this encounter.--- time spent d/w pt and reviewing chart    Ann Held, DO

## 2019-11-03 ENCOUNTER — Encounter: Payer: Self-pay | Admitting: Family Medicine

## 2019-11-03 NOTE — Telephone Encounter (Signed)
It was not in the notes for the visit reminding me to do that ---  Usually angel does this during the medicare wellness He is due for that ----- I will forward this to Glenard Haring too to get him on the schedule for a medicare wellness Pt answered  all questions appropriately during his physical and remembered dates well

## 2019-11-06 ENCOUNTER — Other Ambulatory Visit (HOSPITAL_COMMUNITY): Payer: Self-pay | Admitting: Cardiology

## 2019-11-06 DIAGNOSIS — E876 Hypokalemia: Secondary | ICD-10-CM

## 2019-12-06 ENCOUNTER — Other Ambulatory Visit (HOSPITAL_COMMUNITY): Payer: Self-pay | Admitting: Cardiology

## 2019-12-17 ENCOUNTER — Other Ambulatory Visit: Payer: Self-pay

## 2019-12-17 ENCOUNTER — Ambulatory Visit (HOSPITAL_COMMUNITY)
Admission: RE | Admit: 2019-12-17 | Discharge: 2019-12-17 | Disposition: A | Discharge status: Home / Self Care | Payer: Medicare Other | Source: Ambulatory Visit | Attending: Cardiology | Admitting: Cardiology

## 2019-12-17 VITALS — BP 102/64 | HR 44 | Wt 251.8 lb

## 2019-12-17 DIAGNOSIS — I4819 Other persistent atrial fibrillation: Secondary | ICD-10-CM | POA: Diagnosis not present

## 2019-12-17 DIAGNOSIS — E119 Type 2 diabetes mellitus without complications: Secondary | ICD-10-CM | POA: Diagnosis not present

## 2019-12-17 DIAGNOSIS — I480 Paroxysmal atrial fibrillation: Secondary | ICD-10-CM | POA: Diagnosis present

## 2019-12-17 DIAGNOSIS — I2510 Atherosclerotic heart disease of native coronary artery without angina pectoris: Secondary | ICD-10-CM | POA: Insufficient documentation

## 2019-12-17 DIAGNOSIS — F17210 Nicotine dependence, cigarettes, uncomplicated: Secondary | ICD-10-CM | POA: Diagnosis not present

## 2019-12-17 DIAGNOSIS — I5032 Chronic diastolic (congestive) heart failure: Secondary | ICD-10-CM | POA: Diagnosis not present

## 2019-12-17 DIAGNOSIS — Z881 Allergy status to other antibiotic agents status: Secondary | ICD-10-CM | POA: Insufficient documentation

## 2019-12-17 DIAGNOSIS — I110 Hypertensive heart disease with heart failure: Secondary | ICD-10-CM | POA: Diagnosis not present

## 2019-12-17 DIAGNOSIS — I70212 Atherosclerosis of native arteries of extremities with intermittent claudication, left leg: Secondary | ICD-10-CM | POA: Insufficient documentation

## 2019-12-17 DIAGNOSIS — I4892 Unspecified atrial flutter: Secondary | ICD-10-CM | POA: Insufficient documentation

## 2019-12-17 DIAGNOSIS — E785 Hyperlipidemia, unspecified: Secondary | ICD-10-CM | POA: Insufficient documentation

## 2019-12-17 DIAGNOSIS — Z882 Allergy status to sulfonamides status: Secondary | ICD-10-CM | POA: Diagnosis not present

## 2019-12-17 DIAGNOSIS — J449 Chronic obstructive pulmonary disease, unspecified: Secondary | ICD-10-CM | POA: Diagnosis not present

## 2019-12-17 DIAGNOSIS — I5022 Chronic systolic (congestive) heart failure: Secondary | ICD-10-CM | POA: Diagnosis not present

## 2019-12-17 LAB — BASIC METABOLIC PANEL
Anion gap: 11 (ref 5–15)
BUN: 22 mg/dL (ref 8–23)
CO2: 28 mmol/L (ref 22–32)
Calcium: 8.6 mg/dL — ABNORMAL LOW (ref 8.9–10.3)
Chloride: 99 mmol/L (ref 98–111)
Creatinine, Ser: 1.01 mg/dL — ABNORMAL HIGH (ref 0.44–1.00)
GFR calc Af Amer: >60 mL/min (ref >60)
GFR calc non Af Amer: 54 mL/min — ABNORMAL LOW (ref >60)
Glucose, Bld: 133 mg/dL — ABNORMAL HIGH (ref 70–99)
Potassium: 4.2 mmol/L (ref 3.5–5.1)
Sodium: 138 mmol/L (ref 135–145)

## 2019-12-17 LAB — CBC
HCT: 47 % — ABNORMAL HIGH (ref 36.0–46.0)
Hemoglobin: 15.1 g/dL — ABNORMAL HIGH (ref 12.0–15.0)
MCH: 30.9 pg (ref 26.0–34.0)
MCHC: 32.1 g/dL (ref 30.0–36.0)
MCV: 96.3 fL (ref 80.0–100.0)
Platelets: 316 10*3/uL (ref 150–400)
RBC: 4.88 MIL/uL (ref 3.87–5.11)
RDW: 15.1 % (ref 11.5–15.5)
WBC: 8.4 10*3/uL (ref 4.0–10.5)
nRBC: 0 % (ref 0.0–0.2)

## 2019-12-17 LAB — LIPID PANEL
Cholesterol: 220 mg/dL — ABNORMAL HIGH (ref 0–200)
HDL: 28 mg/dL — ABNORMAL LOW (ref >40)
LDL Cholesterol: 123 mg/dL — ABNORMAL HIGH (ref 0–99)
Total CHOL/HDL Ratio: 7.9 RATIO
Triglycerides: 345 mg/dL — ABNORMAL HIGH (ref <150)
VLDL: 69 mg/dL — ABNORMAL HIGH (ref 0–40)

## 2019-12-17 LAB — MAGNESIUM: Magnesium: 2.5 mg/dL — ABNORMAL HIGH (ref 1.7–2.4)

## 2019-12-17 MED ORDER — VARENICLINE TARTRATE 0.5 MG PO TABS: ORAL_TABLET | ORAL | 5 refills | Status: DC

## 2019-12-17 NOTE — Progress Notes (Signed)
Date:  12/17/2019   ID:  SAJA BALAGUER, DOB [birth year], MRN [id number]   Provider location: [facility] Type of Visit: Established patient   PCP:  Ann Held, DO  Cardiologist: Dr. [name]  History of Present Illness: Heidi Stark is a 79 y.o. female who has history of HTN, DM, hyperlipidemia.  She was admitted in 4/15 with splenic and renal infarcts.  She was found to have paroxysmal atrial fibrillation.  In 4/15, she had fever and flank pain.  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  TEE showed prominent aortic plaque.  While she was in the hospital, she was noted to have a brief run of atrial fibrillation and was started on coumadin.    She has left leg pain with ambulation after about 100 feet, LE dopplers 5/15 showed occluded left external iliac artery.  This has been present ever since she had left iliac damage with a prior back surgery.    She was stable until 1/18, when she developed palpitations and exertional dyspnea.  She was admitted to [facility] with atrial fibrillation/RVR and acute systolic CHF.  EF was found to be 30-35%, down from 50-55% in 2015.  She was diuresed and underwent DCCV back to NSR.  She subsequently had atrial fibrillation ablation in 4/18.  She went back into atrial fibrillation in 5/19 and had DCCV back today NSR.   Echo was done 6/19 and reviewed, EF 55%, mild LVH, moderate diastolic dysfunction, normal RV size and systolic function.   With recurrent atrial fibrillation, she was admitted in 10/19 to start Tikosyn.  She was cardioverted back to NSR.   She returns today for followup of CHF and atrial fibrillation.  She is in NSR today.  Not very active due to left ankle pain, she is using a cane most of the time.  No significant dyspnea walking around her house or into the office today.  No chest pain.  No orthopnea/PND.  Still smoking 1/2 ppd.     Labs (4/15): K 4.1,  creatinine 0.7, HCT 46.2 Labs (7/15): K 3.9, creatinine 0.8, HCT 50 Labs (12/15): K 5, creatinine 0.7, HDL 33, LDL 57 Labs (5/16): K 4, creatinine 0.84, TGs 256, HDL 29, LDL 42, HCT 41.2 Labs (1/18): K 3.4, creatinine 0.78 Labs (2/18): K 4.4, creatinine 0.8, free T3/T4 normal, mildly elevated TSH, LFTs normal, hgb 15.6 Labs (4/18): K 4.2, creatinine 0.86, TSH elevated, free T3 and free T4 normal Labs (5/19): K 3.9, creatinine 0.78 Labs (6/19): K 4.4, creatinine 0.88 Labs (12/19): K 4.2, creatinine 0.85, TGs 322, LDL 77 Labs (2/20): LDL 46, TGs 362 Labs (3/20): K 4.7, creatinine 1.07 Labs (11/20): TGs 282, LDL 92  ECG (personally reviewed): NSR, QTc 435  PMH: 1.Type II diabetes 2. HTN 3. Hyperlipidemia 4. COPD: has quit smoking.  5. CAD: Coronary CTA (4/18) with nonobstructive coronary disease, calcium score 335 Agatston units (82nd percentile).  6. Paroxysmal atrial fibrillation: First noted in 4/15.  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   - Event monitor (4/15) with no atrial fibrillation.  - Admission with atrial fibrillation/RVR in 1/18.  - Atrial fibrillation ablation in 4/18.  - DCCV in 5/19.  - Tikosyn started + DCCV in 10/19.  7. Chronic systolic CHF: ?tachycardia-mediated as first noted in setting of atrial fibrillation with RVR.  - TEE (4/15) with EF 50-55%, mild LVH, grade  III-IV plaque in descending thoracic aorta, RV normal, no PFO, peak RV-RA gradient 42 mmHg.   - Echo (1/18) with EF 30-35%, diffuse hypokinesis, moderate LVH, mildly dilated RV, PASP 55 mmHg.  - Echo (4/18) with EF 50%, anterolateral/inferolateral mild hypokinesis, mild aortic stenosis, IVC normal.  - Echo (6/19) with EF 55%, mild LVH, moderate diastolic dysfunction, normal RV size and systolic function.  7. H/o diskectomy. 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 Lower extremity arterial dopplers (5/15) with occluded left external iliac  artery.  - ABIs (5/18): 1.16 (normal) on right, 0.56 on left.  9. Aortic stenosis: Mild on 4/18 echo.  10. OSA: Uses CPAP  Current Outpatient Medications  Medication Sig Dispense Refill  . apixaban (ELIQUIS) 5 MG TABS tablet Take 1 tablet (5 mg total) by mouth 2 (two) times daily. 60 tablet 11  . Dapagliflozin-metFORMIN HCl ER (XIGDUO XR) 11-998 MG TB24 Take 1 tablet by mouth in the morning and at bedtime.    . dofetilide (TIKOSYN) 125 MCG capsule Take 1 capsule (125 mcg total) by mouth 2 (two) times daily. 30 capsule 7  . doxycycline (VIBRA-TABS) 100 MG tablet Take 1 tablet (100 mg total) by mouth 2 (two) times daily. 20 tablet 0  . ENTRESTO 49-51 MG TAKE 1 TABLET BY MOUTH TWICE A DAY 60 tablet 5  . eplerenone (INSPRA) 25 MG tablet Take 1 tablet (25 mg total) by mouth daily. 90 tablet 0  . fenofibrate (TRICOR) 145 MG tablet Take 1 tablet (145 mg total) by mouth daily. 90 tablet 3  . furosemide (LASIX) 40 MG tablet Take 1.5 tablets (60 mg total) by mouth 2 (two) times daily. Needs appt for further refills 270 tablet 0  . icosapent Ethyl (VASCEPA) 1 g capsule Take 2 capsules (2 g total) by mouth 2 (two) times daily. 120 capsule 5  . insulin glargine (LANTUS) 100 UNIT/ML injection Inject 15 Units into the skin at bedtime.     Marland Kitchen KLOR-CON M10 10 MEQ tablet TAKE 2 TABLETS BY MOUTH TWICE A DAY 120 tablet 1  . metoprolol succinate (TOPROL-XL) 25 MG 24 hr tablet TAKE 1 TABLET BY MOUTH TWICE A DAY 180 tablet 3  . nicotine (NICODERM CQ - DOSED IN MG/24 HOURS) 14 mg/24hr patch Place 1 patch (14 mg total) onto the skin daily. 28 patch 2  . Pitavastatin Calcium (LIVALO) 4 MG TABS Take 1 tablet (4 mg total) by mouth daily. 90 tablet 3  . Vitamin D, Ergocalciferol, (DRISDOL) 1.25 MG (50000 UT) CAPS capsule Take 1 capsule (50,000 Units total) by mouth every 3 (three) days. 10 capsule 0  . varenicline (CHANTIX) 0.5 MG tablet Take 1 tab daily for 3 days, THEN 1 tab twice a day for 4 days, THEN 2 tabs twice a day  there after 90 tablet 5   No current facility-administered medications for this encounter.    Allergies:   Neomycin-bacitracin zn-polymyx, Sulfonamide derivatives, Ciprofloxacin, and Spironolactone   Social History:  The patient  reports that she has quit smoking. Her smoking use included cigarettes. She has a 50.00 pack-year smoking history. She has never used smokeless tobacco. She reports that she does not drink alcohol or use drugs.   Family History:  The patient's family history includes Cancer in her mother; Diabetes in her father, mother, and another family member; Factor V Leiden deficiency in her daughter; Hypertension in her mother; Melanoma in an other family member; Obesity in her father and mother.   ROS:  Please see the history of present illness.   All other systems are personally reviewed and negative.   Exam:   BP 102/64   Pulse (!) 44 Comment: palpated  Wt 114.2 kg (251 lb 12.8 oz)   SpO2 94% Comment: room air  BMI 43.22 kg/m  General: NAD Neck: No JVD, no thyromegaly or thyroid nodule.  Lungs: Clear to auscultation bilaterally with normal respiratory effort. CV: Nondisplaced PMI.  Heart regular S1/S2, no S3/S4, no murmur.  No peripheral edema.  No carotid bruit.  Unable to palpate left PT pulse.   Abdomen: Soft, nontender, no hepatosplenomegaly, no distention.  Skin: Intact without lesions or rashes.  Neurologic: Alert and oriented x 3.  Psych: Normal affect. Extremities: No clubbing or cyanosis.  HEENT: Normal.    Recent Labs: 02/19/2019: ALT 19 12/17/2019: BUN 22; Creatinine, Ser 1.01; Hemoglobin 15.1; Magnesium 2.5; Platelets 316; Potassium 4.2; Sodium 138  Personally reviewed   Wt Readings from Last 3 Encounters:  12/17/19 114.2 kg (251 lb 12.8 oz)  10/21/19 110.7 kg (244 lb)  06/02/19 112 kg (247 lb)      ASSESSMENT AND PLAN:  1. Atrial fibrillation/flutter: Paroxysmal.  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 She was  admitted in 1/18 with atrial fibrillation/RVR and CHF.  She was diuresed and had DCCV back to NSR.  She had atrial fibrillation ablation in 4/18.  In 5/19, she went into atrial fibrillation transiently.  In 10/19, she was back in atrial fibrillation and was admitted for Tikosyn initiation and cardioversion.  She is in NSR.  - Continue Eliquis.  CBC today.  - Continue Tikosyn. ECG ok today, check BMET and Mg.  2. Claudication: Left leg claudication with absent left PT pulse.  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   3. Chronic systolic => diastolic CHF: EF 99991111 on echo in 1/18, down from 50-55% in 4/15.  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  NYHA class II symptoms, she is not volume overloaded by exam.  - Continue Lasix 60 mg bid.    - Continue Toprol XL 25 mg bid.  - Continue Entresto 49/51 bid  - ?rash from spironolactone, now on eplerenone 25 mg daily.    4. CAD: Coronary CTA showed coronary calcium score in the 82nd percentile with nonobstructive coronary disease.  - not on ASA 81 as she is taking Eliqius.  - Continue Vascepa and Livalo, check lipids today.    5. COPD: She smokes 1/2 ppd. She is trying to quit, willing to try Chantix. I will prescribe today.       Followup in 1 year (will see Dr. [name] in 6 months).   Signed, [name], MD  12/17/2019  [facility] [address] [facility] [place] [address] [phone] (office) [phone] (fax)

## 2019-12-17 NOTE — Patient Instructions (Addendum)
START CHANTIX: Take (0.5mg ) 1 tab daily for 3 days, THEN (0.5mg ) 1 tab twice a day for 4 days, THEN (1mg ) 2 tabs twice a day there after   Labs today We will only contact you if something comes back abnormal or we need to make some changes. Otherwise no news is good news!   Your physician wants you to follow-up in: 1 year You will receive a reminder letter in the mail two months in advance. If you don't receive a letter, please call our office to schedule the follow-up appointment.   Please call office at [phone] option 2 if you have any questions or concerns.   At the [facility], you and your health needs are our priority. As part of our continuing mission to provide you with exceptional heart care, we have created designated Provider Care Teams. These Care Teams include your primary Cardiologist (physician) and Advanced Practice Providers (APPs- Physician Assistants and Nurse Practitioners) who all work together to provide you with the care you need, when you need it.   You may see any of the following providers on your designated Care Team at your next follow up: Marland Kitchen Dr Glori Bickers . Dr [name] . Darrick Grinder, NP . Lyda Jester, PA . Audry Riles, PharmD   Please be sure to bring in all your medications bottles to every appointment.

## 2019-12-23 ENCOUNTER — Other Ambulatory Visit (HOSPITAL_COMMUNITY): Payer: Self-pay | Admitting: Cardiology

## 2019-12-23 ENCOUNTER — Other Ambulatory Visit: Payer: Self-pay | Admitting: Cardiology

## 2020-01-29 ENCOUNTER — Other Ambulatory Visit (HOSPITAL_COMMUNITY): Payer: Self-pay | Admitting: Cardiology

## 2020-02-01 ENCOUNTER — Other Ambulatory Visit (HOSPITAL_COMMUNITY): Payer: Self-pay | Admitting: Cardiology

## 2020-02-13 ENCOUNTER — Other Ambulatory Visit (HOSPITAL_COMMUNITY): Payer: Self-pay | Admitting: Cardiology

## 2020-03-08 ENCOUNTER — Other Ambulatory Visit (HOSPITAL_COMMUNITY): Payer: Self-pay | Admitting: Cardiology

## 2020-04-06 ENCOUNTER — Other Ambulatory Visit: Payer: Self-pay | Admitting: Cardiology

## 2020-04-06 ENCOUNTER — Other Ambulatory Visit (HOSPITAL_COMMUNITY): Payer: Self-pay | Admitting: Cardiology

## 2020-04-06 DIAGNOSIS — E876 Hypokalemia: Secondary | ICD-10-CM

## 2020-06-11 ENCOUNTER — Other Ambulatory Visit: Payer: Self-pay | Admitting: Cardiology

## 2020-06-12 NOTE — H&P (View-Only) (Signed)
Electrophysiology Office Note   Date:  06/14/2020   ID:  Heidi Stark, Heidi Stark Feb 17, 1941, MRN [id number]  PCP:  [name], [name], DO  Cardiologist:  [name] Primary Electrophysiologist:  Dr [name]  CC: Follow up for atrial fibrillation   History of Present Illness: Heidi Stark is a 79 y.o. female who is being seen today for the evaluation of atrial fibrillatoin at the request of [name], [name], *. Presenting today for electrophysiology evaluation.   She has a history of a diastolic heart failure, COPD, DVT, hypertension, hyperlipidemia, OSA on CPAP, atrial fibrillation, and diabetes.  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  She was started on Coumadin.  She was also noted to have a left leg pain and was found to have an occluded left internal iliac.  January 2018 she developed exertional dyspnea.  She was found to be in atrial fibrillation with heart failure.  She is status post ablation April 2018.  She was seen in clinic in 2019 and was found to be in atrial fibrillation and was cardioverted.  She is status post admission for dofetilide loading 04/30/2018.   Today, denies symptoms of palpitations, chest pain, shortness of breath, orthopnea, PND, lower extremity edema, claudication, dizziness, presyncope, syncope, bleeding, or neurologic sequela. The patient is tolerating medications without difficulties.  She presents today complaining of mild shortness of breath.  She is unclear as to how long her shortness of breath has been occurring.  She has no chest pain.  She does continue to smoke, up to a pack a day.  She was initially on Chantix, but this has been recalled.   Past Medical History:  Diagnosis Date  . A-fib ([place])   . Arthritis    "hands, wrists" (11/07/2016)  . CHF (congestive heart failure) ([place])   . Chronic lower back pain   . Constipation   . COPD (chronic obstructive pulmonary disease) ([place])   .  DVT (deep venous thrombosis) ([place])   . Dyspnea   . History of blood transfusion 1990   "related to OR"  . History of kidney stones   . Hyperlipidemia   . Hypertension   . Joint pain   . Lower extremity edema   . OSA on CPAP   . Persistent atrial fibrillation ([place])   . Pneumonia    "several times" (11/07/2016)  . Type II diabetes mellitus ([place])    Past Surgical History:  Procedure Laterality Date  . ATRIAL FIBRILLATION ABLATION N/A 11/07/2016   Procedure: Atrial Fibrillation Ablation;  Surgeon: Tyrone Balash [name], MD;  Location: [facility];  Service: Cardiovascular;  Laterality: N/A;  . BACK SURGERY    . CARDIAC CATHETERIZATION    . CARDIOVERSION N/A 08/11/2016   Procedure: CARDIOVERSION;  Surgeon: Larey Dresser, MD;  Location: [facility] ENDOSCOPY;  Service: Cardiovascular;  Laterality: N/A;  . CARDIOVERSION N/A 12/03/2017   Procedure: CARDIOVERSION;  Surgeon: Larey Dresser, MD;  Location: [facility] ENDOSCOPY;  Service: Cardiovascular;  Laterality: N/A;  . CARDIOVERSION N/A 05/02/2018   Procedure: CARDIOVERSION;  Surgeon: Pixie Casino, MD;  Location: [facility] ENDOSCOPY;  Service: Cardiovascular;  Laterality: N/A;  . CARPAL TUNNEL RELEASE    . CATARACT EXTRACTION W/ INTRAOCULAR LENS  IMPLANT, BILATERAL Bilateral   . COLON SURGERY  1990   vein graft and colon repair after nicked artery with back surgert  . COLONOSCOPY    . CYSTECTOMY  between bladder and kidneys  . GANGLION CYST EXCISION Left   . KNEE CARTILAGE SURGERY Right 1960s  . [place] SURGERY  1990  . REPAIR ILIAC ARTERY  1990   vein graft and colon repair after nicked artery with back surgert  . TEE WITHOUT CARDIOVERSION N/A 10/31/2013   Procedure: TRANSESOPHAGEAL ECHOCARDIOGRAM (TEE);  Surgeon: Larey Dresser, MD;  Location: [facility];  Service: Cardiovascular;  Laterality: N/A;  . TUBAL LIGATION       Current Outpatient Medications  Medication Sig Dispense Refill  . Dapagliflozin-metFORMIN HCl ER (XIGDUO XR)  11-998 MG TB24 Take 1 tablet by mouth in the morning and at bedtime.    . dofetilide (TIKOSYN) 125 MCG capsule TAKE 1 CAPSULE BY MOUTH TWICE A DAY 180 capsule 2  . ELIQUIS 5 MG TABS tablet TAKE 1 TABLET BY MOUTH TWICE A DAY 60 tablet 11  . ENTRESTO 49-51 MG TAKE 1 TABLET BY MOUTH TWICE A DAY 180 tablet 3  . eplerenone (INSPRA) 25 MG tablet TAKE 1 TABLET BY MOUTH EVERY DAY 90 tablet 3  . fenofibrate (TRICOR) 145 MG tablet TAKE 1 TABLET BY MOUTH EVERY DAY 90 tablet 3  . furosemide (LASIX) 40 MG tablet Take 1.5 tablets (60 mg total) by mouth 2 (two) times daily. 270 tablet 3  . insulin glargine (LANTUS) 100 UNIT/ML injection Inject 15 Units into the skin at bedtime.     . metoprolol succinate (TOPROL-XL) 25 MG 24 hr tablet TAKE 1 TABLET BY MOUTH TWICE A DAY 180 tablet 3  . Pitavastatin Calcium (LIVALO) 4 MG TABS Take 1 tablet (4 mg total) by mouth daily. 90 tablet 3  . potassium chloride (KLOR-CON M10) 10 MEQ tablet Take 2 tablets (20 mEq total) by mouth 2 (two) times daily. 120 tablet 11  . VASCEPA 1 g capsule TAKE 2 CAPSULES BY MOUTH TWICE A DAY 120 capsule 5   No current facility-administered medications for this visit.    Allergies:   Neomycin-bacitracin zn-polymyx, Sulfonamide derivatives, Ciprofloxacin, and Spironolactone   Social History:  The patient  reports that she has quit smoking. Her smoking use included cigarettes. She has a 50.00 pack-year smoking history. She has never used smokeless tobacco. She reports that she does not drink alcohol and does not use drugs.   Family History:  The patient's family history includes Cancer in her mother; Diabetes in her father, mother, and another family member; Factor V Leiden deficiency in her daughter; Hypertension in her mother; Melanoma in an other family member; Obesity in her father and mother.   ROS:  Please see the history of present illness.   Otherwise, review of systems is positive for none.   All other systems are reviewed and  negative.   PHYSICAL EXAM: VS:  BP 132/62   Pulse (!) 122   Ht 5\' 6"  (1.676 m)   Wt 260 lb (117.9 kg)   SpO2 96%   BMI 41.97 kg/m  , BMI Body mass index is 41.97 kg/m. GEN: Well nourished, well developed, in no acute distress  HEENT: normal  Neck: no JVD, carotid bruits, or masses Cardiac: RRR; no murmurs, rubs, or gallops,no edema  Respiratory:  clear to auscultation bilaterally, normal work of breathing GI: soft, nontender, nondistended, + BS MS: no deformity or atrophy  Skin: warm and dry Neuro:  Strength and sensation are intact Psych: euthymic mood, full affect  EKG:  EKG is ordered today. Personal review of the ekg ordered shows atrial flutter, rate 122  Recent Labs: 12/17/2019: BUN 22; Creatinine, Ser 1.01; Hemoglobin 15.1; Magnesium 2.5; Platelets 316; Potassium 4.2; Sodium 138    Lipid Panel     Component Value Date/Time   CHOL 220 (H) 12/17/2019 1049   CHOL 170 06/02/2019 0913   TRIG 345 (H) 12/17/2019 1049   HDL 28 (L) 12/17/2019 1049   HDL 31 (L) 06/02/2019 0913   CHOLHDL 7.9 12/17/2019 1049   VLDL 69 (H) 12/17/2019 1049   LDLCALC 123 (H) 12/17/2019 1049   LDLCALC 92 06/02/2019 0913   LDLCALC 77 06/28/2018 1410   LDLDIRECT 42.0 11/30/2014 1226     Wt Readings from Last 3 Encounters:  06/14/20 260 lb (117.9 kg)  12/17/19 251 lb 12.8 oz (114.2 kg)  10/21/19 244 lb (110.7 kg)      Other studies Reviewed: Additional studies/ records that were reviewed today include: TTE 12/21/17  Review of the above records today demonstrates:  - Left ventricle: The cavity size was normal. Wall thickness was   increased in a pattern of mild LVH. The estimated ejection   fraction was 55%. Although no diagnostic regional wall motion   abnormality was identified, this possibility cannot be completely   excluded on the basis of this study. Features are consistent with   a pseudonormal left ventricular filling pattern, with concomitant   abnormal relaxation and increased  filling pressure (grade 2   diastolic dysfunction). - Aortic valve: There was no stenosis. - Mitral valve: Moderately fibrotic annulus. There was no   significant regurgitation. - Left atrium: The atrium was moderately dilated. - Right ventricle: The cavity size was normal. Systolic function   was normal. - Right atrium: The atrium was mildly dilated. - Tricuspid valve: Peak RV-RA gradient (S): 16 mm Hg. - Pulmonary arteries: PA peak pressure: 19 mm Hg (S). - Inferior vena cava: The vessel was normal in size. The   respirophasic diameter changes were in the normal range (>= 50%),   consistent with normal central venous pressure.   ASSESSMENT AND PLAN:  1.  Persistent atrial fibrillation/atrial flutter: Currently on Eliquis and dofetilide, monitoring for high risk medication.  CHA2DS2-VASc of 6.  Unfortunately she is back in atrial flutter today.  She does have symptoms of shortness of breath.  We Quanta Robertshaw plan for cardioversion.  2.  Chronic systolic heart failure: Ejection fraction has improved.  Currently on Toprol-XL, eplerenone, Lasix, Entresto.    3.  Coronary artery disease: Calcium score in the 82nd percentile.  Continue Crestor.  4.  COPD: Advised to quit smoking  5.  Obstructive sleep apnea: CPAP compliance encouraged   Current medicines are reviewed at length with the patient today.   The patient does not have concerns regarding her medicines.  The following changes were made today: none  Labs/ tests ordered today include:  Orders Placed This Encounter  Procedures  . Basic metabolic panel  . CBC  . EKG 12-Lead     Disposition:   FU with Lakelyn Straus 3 months  Signed, Silvano Garofano [name], MD  06/14/2020 9:58 AM     [facility] [address] [place] [address] [phone] (office) [phone] (fax)    Electrophysiology Office Note   Date:  06/14/2020   ID:  Machaela, Caterino Jun 11, 1941, MRN [id number]  PCP:  Ann Held, DO  Cardiologist:  Dana Allan Electrophysiologist:  Dr [name]  CC: Follow up for atrial fibrillation   History of Present Illness: Heidi Stark is a  79 y.o. female who is being seen today for the evaluation of atrial fibrillatoin at the request of [name], [name], *. Presenting today for electrophysiology evaluation.    She has a history of diastolic heart failure, COPD, DVT, hypertension, hyperlipidemia, OSA on CPAP, atrial fibrillation, and diabetes.  She does wear oxygen to sleep at night.  She was admitted in 2015 with splenic and renal infarcts and was found to have atrial fibrillation at that time.  She was also found to have an occluded left internal iliac.  January 2018 she was admitted with rapid atrial fibrillation and heart failure.  She has now status post AF ablation 11/07/2016.  She had further episodes of atrial fibrillation and has since been loaded on dofetilide.  Today, denies symptoms of palpitations, chest pain,  orthopnea, PND, lower extremity edema, claudication, dizziness, presyncope, syncope, bleeding, or neurologic sequela. The patient is tolerating medications without difficulties.  Her main complaint today is shortness of breath.  She is having no chest pain.  Unfortunately she is in atrial flutter today with a heart rate of 122.  She is unclear as to when she went out of rhythm.  She is continuing to smoke, approximately a pack a day.  She was initially on Chantix, but there is a recall on this medication.   Past Medical History:  Diagnosis Date  . A-fib ([place])   . Arthritis    "hands, wrists" (11/07/2016)  . CHF (congestive heart failure) ([place])   . Chronic lower back pain   . Constipation   . COPD (chronic obstructive pulmonary disease) ([place])   . DVT (deep venous thrombosis) ([place])   . Dyspnea   . History of blood transfusion 1990   "related to OR"  . History of kidney stones   . Hyperlipidemia   . Hypertension   . Joint pain   . Lower  extremity edema   . OSA on CPAP   . Persistent atrial fibrillation ([place])   . Pneumonia    "several times" (11/07/2016)  . Type II diabetes mellitus ([place])    Past Surgical History:  Procedure Laterality Date  . ATRIAL FIBRILLATION ABLATION N/A 11/07/2016   Procedure: Atrial Fibrillation Ablation;  Surgeon: Winslow Ederer [name], MD;  Location: [place] [facility];  Service: Cardiovascular;  Laterality: N/A;  . BACK SURGERY    . CARDIAC CATHETERIZATION    . CARDIOVERSION N/A 08/11/2016   Procedure: CARDIOVERSION;  Surgeon: Larey Dresser, MD;  Location: [facility] ENDOSCOPY;  Service: Cardiovascular;  Laterality: N/A;  . CARDIOVERSION N/A 12/03/2017   Procedure: CARDIOVERSION;  Surgeon: Larey Dresser, MD;  Location: [facility] ENDOSCOPY;  Service: Cardiovascular;  Laterality: N/A;  . CARDIOVERSION N/A 05/02/2018   Procedure: CARDIOVERSION;  Surgeon: Pixie Casino, MD;  Location: [facility] ENDOSCOPY;  Service: Cardiovascular;  Laterality: N/A;  . CARPAL TUNNEL RELEASE    . CATARACT EXTRACTION W/ INTRAOCULAR LENS  IMPLANT, BILATERAL Bilateral   . COLON SURGERY  1990   vein graft and colon repair after nicked artery with back surgert  . COLONOSCOPY    . CYSTECTOMY     between bladder and kidneys  . GANGLION CYST EXCISION Left   . KNEE CARTILAGE SURGERY Right 1960s  . [place] SURGERY  1990  . REPAIR ILIAC ARTERY  1990   vein graft and colon repair after nicked artery with back surgert  . TEE WITHOUT CARDIOVERSION N/A 10/31/2013   Procedure: TRANSESOPHAGEAL ECHOCARDIOGRAM (TEE);  Surgeon: Larey Dresser, MD;  Location: [facility]  ENDOSCOPY;  Service: Cardiovascular;  Laterality: N/A;  . TUBAL LIGATION       Current Outpatient Medications  Medication Sig Dispense Refill  . Dapagliflozin-metFORMIN HCl ER (XIGDUO XR) 11-998 MG TB24 Take 1 tablet by mouth in the morning and at bedtime.    . dofetilide (TIKOSYN) 125 MCG capsule TAKE 1 CAPSULE BY MOUTH TWICE A DAY 180 capsule 2  . ELIQUIS 5 MG TABS tablet TAKE 1  TABLET BY MOUTH TWICE A DAY 60 tablet 11  . ENTRESTO 49-51 MG TAKE 1 TABLET BY MOUTH TWICE A DAY 180 tablet 3  . eplerenone (INSPRA) 25 MG tablet TAKE 1 TABLET BY MOUTH EVERY DAY 90 tablet 3  . fenofibrate (TRICOR) 145 MG tablet TAKE 1 TABLET BY MOUTH EVERY DAY 90 tablet 3  . furosemide (LASIX) 40 MG tablet Take 1.5 tablets (60 mg total) by mouth 2 (two) times daily. 270 tablet 3  . insulin glargine (LANTUS) 100 UNIT/ML injection Inject 15 Units into the skin at bedtime.     . metoprolol succinate (TOPROL-XL) 25 MG 24 hr tablet TAKE 1 TABLET BY MOUTH TWICE A DAY 180 tablet 3  . Pitavastatin Calcium (LIVALO) 4 MG TABS Take 1 tablet (4 mg total) by mouth daily. 90 tablet 3  . potassium chloride (KLOR-CON M10) 10 MEQ tablet Take 2 tablets (20 mEq total) by mouth 2 (two) times daily. 120 tablet 11  . VASCEPA 1 g capsule TAKE 2 CAPSULES BY MOUTH TWICE A DAY 120 capsule 5   No current facility-administered medications for this visit.    Allergies:   Neomycin-bacitracin zn-polymyx, Sulfonamide derivatives, Ciprofloxacin, and Spironolactone   Social History:  The patient  reports that she has quit smoking. Her smoking use included cigarettes. She has a 50.00 pack-year smoking history. She has never used smokeless tobacco. She reports that she does not drink alcohol and does not use drugs.   Family History:  The patient's family history includes Cancer in her mother; Diabetes in her father, mother, and another family member; Factor V Leiden deficiency in her daughter; Hypertension in her mother; Melanoma in an other family member; Obesity in her father and mother.   ROS:  Please see the history of present illness.   Otherwise, review of systems is positive for none.   All other systems are reviewed and negative.   PHYSICAL EXAM: VS:  BP 132/62   Pulse (!) 122   Ht 5\' 6"  (1.676 m)   Wt 260 lb (117.9 kg)   SpO2 96%   BMI 41.97 kg/m  , BMI Body mass index is 41.97 kg/m. GEN: Well nourished, well  developed, in no acute distress  HEENT: normal  Neck: no JVD, carotid bruits, or masses Cardiac: Tachycardic, regular; no murmurs, rubs, or gallops,no edema  Respiratory:  clear to auscultation bilaterally, normal work of breathing GI: soft, nontender, nondistended, + BS MS: no deformity or atrophy  Skin: warm and dry Neuro:  Strength and sensation are intact Psych: euthymic mood, full affect  EKG:  EKG is ordered today. Personal review of the ekg ordered shows atrial flutter, rate 122  Recent Labs: 12/17/2019: BUN 22; Creatinine, Ser 1.01; Hemoglobin 15.1; Magnesium 2.5; Platelets 316; Potassium 4.2; Sodium 138    Lipid Panel     Component Value Date/Time   CHOL 220 (H) 12/17/2019 1049   CHOL 170 06/02/2019 0913   TRIG 345 (H) 12/17/2019 1049   HDL 28 (L) 12/17/2019 1049   HDL 31 (L) 06/02/2019 0913  CHOLHDL 7.9 12/17/2019 1049   VLDL 69 (H) 12/17/2019 1049   LDLCALC 123 (H) 12/17/2019 1049   LDLCALC 92 06/02/2019 0913   LDLCALC 77 06/28/2018 1410   LDLDIRECT 42.0 11/30/2014 1226     Wt Readings from Last 3 Encounters:  06/14/20 260 lb (117.9 kg)  12/17/19 251 lb 12.8 oz (114.2 kg)  10/21/19 244 lb (110.7 kg)      Other studies Reviewed: Additional studies/ records that were reviewed today include: TTE 12/21/17  Review of the above records today demonstrates:  - Left ventricle: The cavity size was normal. Wall thickness was   increased in a pattern of mild LVH. The estimated ejection   fraction was 55%. Although no diagnostic regional wall motion   abnormality was identified, this possibility cannot be completely   excluded on the basis of this study. Features are consistent with   a pseudonormal left ventricular filling pattern, with concomitant   abnormal relaxation and increased filling pressure (grade 2   diastolic dysfunction). - Aortic valve: There was no stenosis. - Mitral valve: Moderately fibrotic annulus. There was no   significant regurgitation. - Left  atrium: The atrium was moderately dilated. - Right ventricle: The cavity size was normal. Systolic function   was normal. - Right atrium: The atrium was mildly dilated. - Tricuspid valve: Peak RV-RA gradient (S): 16 mm Hg. - Pulmonary arteries: PA peak pressure: 19 mm Hg (S). - Inferior vena cava: The vessel was normal in size. The   respirophasic diameter changes were in the normal range (>= 50%),   consistent with normal central venous pressure.   ASSESSMENT AND PLAN:  1.  Paroxysmal atrial fibrillation, atrial flutter: Currently on Eliquis and dofetilide.  CHA2DS2-VASc of 6.  Unfortunately she is in a atrial flutter today, likely atypical.  She has some shortness of breath.  Her heart rate is quite fast and thus I am worried about a possible tachycardia mediated cardiomyopathy if we do not get her back into normal rhythm.  We Artavis Cowie plan for cardioversion.  2.  Chronic systolic heart failure: Ejection fraction is improved.  Currently on Toprol-XL, eplerenone, Lasix, Entresto.  No obvious volume overload.    3.  Coronary artery disease: Calcium score in the 82nd percent.  Continue Crestor.    4.  COPD: Complete cessation encouraged  5.  OSA: CPAP compliance encouraged   Current medicines are reviewed at length with the patient today.   The patient does not have concerns regarding her medicines.  The following changes were made today: None  Labs/ tests ordered today include:  Orders Placed This Encounter  Procedures  . Basic metabolic panel  . CBC  . EKG 12-Lead     Disposition:   FU with Joanne Brander 3 months  Signed, Felma Pfefferle [name], MD  06/14/2020 9:58 AM     [facility] [address] [place]  [address] [phone] (office) [phone] (fax)

## 2020-06-12 NOTE — Progress Notes (Signed)
Electrophysiology Office Note   Date:  06/14/2020   ID:  Khloie, [place] [birth year], MRN [id number]  PCP:  [name], [name], DO  Cardiologist:  [name] Primary Electrophysiologist:  Dr [name]  CC: Follow up for atrial fibrillation   History of Present Illness: Heidi Stark is a 79 y.o. female who is being seen today for the evaluation of atrial fibrillatoin at the request of [name], [name], *. Presenting today for electrophysiology evaluation.   She has a history of a diastolic heart failure, COPD, DVT, hypertension, hyperlipidemia, OSA on CPAP, atrial fibrillation, and diabetes.  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  She was started on Coumadin.  She was also noted to have a left leg pain and was found to have an occluded left internal iliac.  January 2018 she developed exertional dyspnea.  She was found to be in atrial fibrillation with heart failure.  She is status post ablation April 2018.  She was seen in clinic in 2019 and was found to be in atrial fibrillation and was cardioverted.  She is status post admission for dofetilide loading 04/30/2018.   Today, denies symptoms of palpitations, chest pain, shortness of breath, orthopnea, PND, lower extremity edema, claudication, dizziness, presyncope, syncope, bleeding, or neurologic sequela. The patient is tolerating medications without difficulties.  She presents today complaining of mild shortness of breath.  She is unclear as to how long her shortness of breath has been occurring.  She has no chest pain.  She does continue to smoke, up to a pack a day.  She was initially on Chantix, but this has been recalled.   Past Medical History:  Diagnosis Date  . A-fib ([place])   . Arthritis    "hands, wrists" (11/07/2016)  . CHF (congestive heart failure) ([place])   . Chronic lower back pain   . Constipation   . COPD (chronic obstructive pulmonary disease) ([place])   .  DVT (deep venous thrombosis) ([place])   . Dyspnea   . History of blood transfusion 1990   "related to OR"  . History of kidney stones   . Hyperlipidemia   . Hypertension   . Joint pain   . Lower extremity edema   . OSA on CPAP   . Persistent atrial fibrillation ([place])   . Pneumonia    "several times" (11/07/2016)  . Type II diabetes mellitus ([place])    Past Surgical History:  Procedure Laterality Date  . ATRIAL FIBRILLATION ABLATION N/A 11/07/2016   Procedure: Atrial Fibrillation Ablation;  Surgeon: Alani Lacivita [name], MD;  Location: [facility];  Service: Cardiovascular;  Laterality: N/A;  . BACK SURGERY    . CARDIAC CATHETERIZATION    . CARDIOVERSION N/A 08/11/2016   Procedure: CARDIOVERSION;  Surgeon: Larey Dresser, MD;  Location: [facility] ENDOSCOPY;  Service: Cardiovascular;  Laterality: N/A;  . CARDIOVERSION N/A 12/03/2017   Procedure: CARDIOVERSION;  Surgeon: Larey Dresser, MD;  Location: [facility] ENDOSCOPY;  Service: Cardiovascular;  Laterality: N/A;  . CARDIOVERSION N/A 05/02/2018   Procedure: CARDIOVERSION;  Surgeon: Pixie Casino, MD;  Location: [facility] ENDOSCOPY;  Service: Cardiovascular;  Laterality: N/A;  . CARPAL TUNNEL RELEASE    . CATARACT EXTRACTION W/ INTRAOCULAR LENS  IMPLANT, BILATERAL Bilateral   . COLON SURGERY  1990   vein graft and colon repair after nicked artery with back surgert  . COLONOSCOPY    . CYSTECTOMY  between bladder and kidneys  . GANGLION CYST EXCISION Left   . KNEE CARTILAGE SURGERY Right 1960s  . [place] SURGERY  1990  . REPAIR ILIAC ARTERY  1990   vein graft and colon repair after nicked artery with back surgert  . TEE WITHOUT CARDIOVERSION N/A 10/31/2013   Procedure: TRANSESOPHAGEAL ECHOCARDIOGRAM (TEE);  Surgeon: Larey Dresser, MD;  Location: [facility];  Service: Cardiovascular;  Laterality: N/A;  . TUBAL LIGATION       Current Outpatient Medications  Medication Sig Dispense Refill  . Dapagliflozin-metFORMIN HCl ER (XIGDUO XR)  11-998 MG TB24 Take 1 tablet by mouth in the morning and at bedtime.    . dofetilide (TIKOSYN) 125 MCG capsule TAKE 1 CAPSULE BY MOUTH TWICE A DAY 180 capsule 2  . ELIQUIS 5 MG TABS tablet TAKE 1 TABLET BY MOUTH TWICE A DAY 60 tablet 11  . ENTRESTO 49-51 MG TAKE 1 TABLET BY MOUTH TWICE A DAY 180 tablet 3  . eplerenone (INSPRA) 25 MG tablet TAKE 1 TABLET BY MOUTH EVERY DAY 90 tablet 3  . fenofibrate (TRICOR) 145 MG tablet TAKE 1 TABLET BY MOUTH EVERY DAY 90 tablet 3  . furosemide (LASIX) 40 MG tablet Take 1.5 tablets (60 mg total) by mouth 2 (two) times daily. 270 tablet 3  . insulin glargine (LANTUS) 100 UNIT/ML injection Inject 15 Units into the skin at bedtime.     . metoprolol succinate (TOPROL-XL) 25 MG 24 hr tablet TAKE 1 TABLET BY MOUTH TWICE A DAY 180 tablet 3  . Pitavastatin Calcium (LIVALO) 4 MG TABS Take 1 tablet (4 mg total) by mouth daily. 90 tablet 3  . potassium chloride (KLOR-CON M10) 10 MEQ tablet Take 2 tablets (20 mEq total) by mouth 2 (two) times daily. 120 tablet 11  . VASCEPA 1 g capsule TAKE 2 CAPSULES BY MOUTH TWICE A DAY 120 capsule 5   No current facility-administered medications for this visit.    Allergies:   Neomycin-bacitracin zn-polymyx, Sulfonamide derivatives, Ciprofloxacin, and Spironolactone   Social History:  The patient  reports that she has quit smoking. Her smoking use included cigarettes. She has a 50.00 pack-year smoking history. She has never used smokeless tobacco. She reports that she does not drink alcohol and does not use drugs.   Family History:  The patient's family history includes Cancer in her mother; Diabetes in her father, mother, and another family member; Factor V Leiden deficiency in her daughter; Hypertension in her mother; Melanoma in an other family member; Obesity in her father and mother.   ROS:  Please see the history of present illness.   Otherwise, review of systems is positive for none.   All other systems are reviewed and  negative.   PHYSICAL EXAM: VS:  BP 132/62   Pulse (!) 122   Ht 5\' 6"  (1.676 m)   Wt 260 lb (117.9 kg)   SpO2 96%   BMI 41.97 kg/m  , BMI Body mass index is 41.97 kg/m. GEN: Well nourished, well developed, in no acute distress  HEENT: normal  Neck: no JVD, carotid bruits, or masses Cardiac: RRR; no murmurs, rubs, or gallops,no edema  Respiratory:  clear to auscultation bilaterally, normal work of breathing GI: soft, nontender, nondistended, + BS MS: no deformity or atrophy  Skin: warm and dry Neuro:  Strength and sensation are intact Psych: euthymic mood, full affect  EKG:  EKG is ordered today. Personal review of the ekg ordered shows atrial flutter, rate 122  Recent Labs: 12/17/2019: BUN 22; Creatinine, Ser 1.01; Hemoglobin 15.1; Magnesium 2.5; Platelets 316; Potassium 4.2; Sodium 138    Lipid Panel     Component Value Date/Time   CHOL 220 (H) 12/17/2019 1049   CHOL 170 06/02/2019 0913   TRIG 345 (H) 12/17/2019 1049   HDL 28 (L) 12/17/2019 1049   HDL 31 (L) 06/02/2019 0913   CHOLHDL 7.9 12/17/2019 1049   VLDL 69 (H) 12/17/2019 1049   LDLCALC 123 (H) 12/17/2019 1049   LDLCALC 92 06/02/2019 0913   LDLCALC 77 06/28/2018 1410   LDLDIRECT 42.0 11/30/2014 1226     Wt Readings from Last 3 Encounters:  06/14/20 260 lb (117.9 kg)  12/17/19 251 lb 12.8 oz (114.2 kg)  10/21/19 244 lb (110.7 kg)      Other studies Reviewed: Additional studies/ records that were reviewed today include: TTE 6/7/Stark  Review of the above records today demonstrates:  - Left ventricle: The cavity size was normal. Wall thickness was   increased in a pattern of mild LVH. The estimated ejection   fraction was 55%. Although no diagnostic regional wall motion   abnormality was identified, this possibility cannot be completely   excluded on the basis of this study. Features are consistent with   a pseudonormal left ventricular filling pattern, with concomitant   abnormal relaxation and increased  filling pressure (grade 2   diastolic dysfunction). - Aortic valve: There was no stenosis. - Mitral valve: Moderately fibrotic annulus. There was no   significant regurgitation. - Left atrium: The atrium was moderately dilated. - Right ventricle: The cavity size was normal. Systolic function   was normal. - Right atrium: The atrium was mildly dilated. - Tricuspid valve: Peak RV-RA gradient (S): 16 mm Hg. - Pulmonary arteries: PA peak pressure: Stark mm Hg (S). - Inferior vena cava: The vessel was normal in size. The   respirophasic diameter changes were in the normal range (>= 50%),   consistent with normal central venous pressure.   ASSESSMENT AND PLAN:  1.  Persistent atrial fibrillation/atrial flutter: Currently on Eliquis and dofetilide, monitoring for high risk medication.  CHA2DS2-VASc of 6.  Unfortunately she is back in atrial flutter today.  She does have symptoms of shortness of breath.  We [place] Gavitt plan for cardioversion.  2.  Chronic systolic heart failure: Ejection fraction has improved.  Currently on Toprol-XL, eplerenone, Lasix, Entresto.    3.  Coronary artery disease: Calcium score in the 82nd percentile.  Continue Crestor.  4.  COPD: Advised to quit smoking  5.  Obstructive sleep apnea: CPAP compliance encouraged   Current medicines are reviewed at length with the patient today.   The patient does not have concerns regarding her medicines.  The following changes were made today: none  Labs/ tests ordered today include:  Orders Placed This Encounter  Procedures  . Basic metabolic panel  . CBC  . EKG 12-Lead     Disposition:   FU with Sumaiya Arruda 3 months  Signed, Lovie Agresta [name], MD  06/14/2020 9:58 AM     [facility] [address] [place] [address] [phone] (office) [phone] (fax)    Electrophysiology Office Note   Date:  06/14/2020   ID:  Heidi Stark, Heidi Stark, Heidi Stark, MRN [id number]  PCP:  Ann Held, DO  Cardiologist:  Dana Allan Electrophysiologist:  Dr [name]  CC: Follow up for atrial fibrillation   History of Present Illness: CADEY BAZILE is a  79 y.o. female who is being seen today for the evaluation of atrial fibrillatoin at the request of [name], [name], *. Presenting today for electrophysiology evaluation.    She has a history of diastolic heart failure, COPD, DVT, hypertension, hyperlipidemia, OSA on CPAP, atrial fibrillation, and diabetes.  She does wear oxygen to sleep at night.  She was admitted in 2015 with splenic and renal infarcts and was found to have atrial fibrillation at that time.  She was also found to have an occluded left internal iliac.  January 2018 she was admitted with rapid atrial fibrillation and heart failure.  She has now status post AF ablation 11/07/2016.  She had further episodes of atrial fibrillation and has since been loaded on dofetilide.  Today, denies symptoms of palpitations, chest pain,  orthopnea, PND, lower extremity edema, claudication, dizziness, presyncope, syncope, bleeding, or neurologic sequela. The patient is tolerating medications without difficulties.  Her main complaint today is shortness of breath.  She is having no chest pain.  Unfortunately she is in atrial flutter today with a heart rate of 122.  She is unclear as to when she went out of rhythm.  She is continuing to smoke, approximately a pack a day.  She was initially on Chantix, but there is a recall on this medication.   Past Medical History:  Diagnosis Date  . A-fib ([place])   . Arthritis    "hands, wrists" (11/07/2016)  . CHF (congestive heart failure) ([place])   . Chronic lower back pain   . Constipation   . COPD (chronic obstructive pulmonary disease) ([place])   . DVT (deep venous thrombosis) ([place])   . Dyspnea   . History of blood transfusion 1990   "related to OR"  . History of kidney stones   . Hyperlipidemia   . Hypertension   . Joint pain   . Lower  extremity edema   . OSA on CPAP   . Persistent atrial fibrillation ([place])   . Pneumonia    "several times" (11/07/2016)  . Type II diabetes mellitus ([place])    Past Surgical History:  Procedure Laterality Date  . ATRIAL FIBRILLATION ABLATION N/A 11/07/2016   Procedure: Atrial Fibrillation Ablation;  Surgeon: Mortimer Bair [name], MD;  Location: [facility];  Service: Cardiovascular;  Laterality: N/A;  . BACK SURGERY    . CARDIAC CATHETERIZATION    . CARDIOVERSION N/A 08/11/2016   Procedure: CARDIOVERSION;  Surgeon: Larey Dresser, MD;  Location: [facility] ENDOSCOPY;  Service: Cardiovascular;  Laterality: N/A;  . CARDIOVERSION N/A 12/03/2017   Procedure: CARDIOVERSION;  Surgeon: Larey Dresser, MD;  Location: [facility] ENDOSCOPY;  Service: Cardiovascular;  Laterality: N/A;  . CARDIOVERSION N/A 05/02/2018   Procedure: CARDIOVERSION;  Surgeon: Pixie Casino, MD;  Location: [facility] ENDOSCOPY;  Service: Cardiovascular;  Laterality: N/A;  . CARPAL TUNNEL RELEASE    . CATARACT EXTRACTION W/ INTRAOCULAR LENS  IMPLANT, BILATERAL Bilateral   . COLON SURGERY  1990   vein graft and colon repair after nicked artery with back surgert  . COLONOSCOPY    . CYSTECTOMY     between bladder and kidneys  . GANGLION CYST EXCISION Left   . KNEE CARTILAGE SURGERY Right 1960s  . [place] SURGERY  1990  . REPAIR ILIAC ARTERY  1990   vein graft and colon repair after nicked artery with back surgert  . TEE WITHOUT CARDIOVERSION N/A 10/31/2013   Procedure: TRANSESOPHAGEAL ECHOCARDIOGRAM (TEE);  Surgeon: Larey Dresser, MD;  Location: [facility]  ENDOSCOPY;  Service: Cardiovascular;  Laterality: N/A;  . TUBAL LIGATION       Current Outpatient Medications  Medication Sig Dispense Refill  . Dapagliflozin-metFORMIN HCl ER (XIGDUO XR) 11-998 MG TB24 Take 1 tablet by mouth in the morning and at bedtime.    . dofetilide (TIKOSYN) 125 MCG capsule TAKE 1 CAPSULE BY MOUTH TWICE A DAY 180 capsule 2  . ELIQUIS 5 MG TABS tablet TAKE 1  TABLET BY MOUTH TWICE A DAY 60 tablet 11  . ENTRESTO 49-51 MG TAKE 1 TABLET BY MOUTH TWICE A DAY 180 tablet 3  . eplerenone (INSPRA) 25 MG tablet TAKE 1 TABLET BY MOUTH EVERY DAY 90 tablet 3  . fenofibrate (TRICOR) 145 MG tablet TAKE 1 TABLET BY MOUTH EVERY DAY 90 tablet 3  . furosemide (LASIX) 40 MG tablet Take 1.5 tablets (60 mg total) by mouth 2 (two) times daily. 270 tablet 3  . insulin glargine (LANTUS) 100 UNIT/ML injection Inject 15 Units into the skin at bedtime.     . metoprolol succinate (TOPROL-XL) 25 MG 24 hr tablet TAKE 1 TABLET BY MOUTH TWICE A DAY 180 tablet 3  . Pitavastatin Calcium (LIVALO) 4 MG TABS Take 1 tablet (4 mg total) by mouth daily. 90 tablet 3  . potassium chloride (KLOR-CON M10) 10 MEQ tablet Take 2 tablets (20 mEq total) by mouth 2 (two) times daily. 120 tablet 11  . VASCEPA 1 g capsule TAKE 2 CAPSULES BY MOUTH TWICE A DAY 120 capsule 5   No current facility-administered medications for this visit.    Allergies:   Neomycin-bacitracin zn-polymyx, Sulfonamide derivatives, Ciprofloxacin, and Spironolactone   Social History:  The patient  reports that she has quit smoking. Her smoking use included cigarettes. She has a 50.00 pack-year smoking history. She has never used smokeless tobacco. She reports that she does not drink alcohol and does not use drugs.   Family History:  The patient's family history includes Cancer in her mother; Diabetes in her father, mother, and another family member; Factor V Leiden deficiency in her daughter; Hypertension in her mother; Melanoma in an other family member; Obesity in her father and mother.   ROS:  Please see the history of present illness.   Otherwise, review of systems is positive for none.   All other systems are reviewed and negative.   PHYSICAL EXAM: VS:  BP 132/62   Pulse (!) 122   Ht 5\' 6"  (1.676 m)   Wt 260 lb (117.9 kg)   SpO2 96%   BMI 41.97 kg/m  , BMI Body mass index is 41.97 kg/m. GEN: Well nourished, well  developed, in no acute distress  HEENT: normal  Neck: no JVD, carotid bruits, or masses Cardiac: Tachycardic, regular; no murmurs, rubs, or gallops,no edema  Respiratory:  clear to auscultation bilaterally, normal work of breathing GI: soft, nontender, nondistended, + BS MS: no deformity or atrophy  Skin: warm and dry Neuro:  Strength and sensation are intact Psych: euthymic mood, full affect  EKG:  EKG is ordered today. Personal review of the ekg ordered shows atrial flutter, rate 122  Recent Labs: 12/17/2019: BUN 22; Creatinine, Ser 1.01; Hemoglobin 15.1; Magnesium 2.5; Platelets 316; Potassium 4.2; Sodium 138    Lipid Panel     Component Value Date/Time   CHOL 220 (H) 12/17/2019 1049   CHOL 170 06/02/2019 0913   TRIG 345 (H) 12/17/2019 1049   HDL 28 (L) 12/17/2019 1049   HDL 31 (L) 06/02/2019 0913  CHOLHDL 7.9 12/17/2019 1049   VLDL 69 (H) 12/17/2019 1049   LDLCALC 123 (H) 12/17/2019 1049   LDLCALC 92 06/02/2019 0913   LDLCALC 77 06/28/2018 1410   LDLDIRECT 42.0 11/30/2014 1226     Wt Readings from Last 3 Encounters:  06/14/20 260 lb (117.9 kg)  12/17/19 251 lb 12.8 oz (114.2 kg)  10/21/19 244 lb (110.7 kg)      Other studies Reviewed: Additional studies/ records that were reviewed today include: TTE 6/7/Stark  Review of the above records today demonstrates:  - Left ventricle: The cavity size was normal. Wall thickness was   increased in a pattern of mild LVH. The estimated ejection   fraction was 55%. Although no diagnostic regional wall motion   abnormality was identified, this possibility cannot be completely   excluded on the basis of this study. Features are consistent with   a pseudonormal left ventricular filling pattern, with concomitant   abnormal relaxation and increased filling pressure (grade 2   diastolic dysfunction). - Aortic valve: There was no stenosis. - Mitral valve: Moderately fibrotic annulus. There was no   significant regurgitation. - Left  atrium: The atrium was moderately dilated. - Right ventricle: The cavity size was normal. Systolic function   was normal. - Right atrium: The atrium was mildly dilated. - Tricuspid valve: Peak RV-RA gradient (S): 16 mm Hg. - Pulmonary arteries: PA peak pressure: Stark mm Hg (S). - Inferior vena cava: The vessel was normal in size. The   respirophasic diameter changes were in the normal range (>= 50%),   consistent with normal central venous pressure.   ASSESSMENT AND PLAN:  1.  Paroxysmal atrial fibrillation, atrial flutter: Currently on Eliquis and dofetilide.  CHA2DS2-VASc of 6.  Unfortunately she is in a atrial flutter today, likely atypical.  She has some shortness of breath.  Her heart rate is quite fast and thus I am worried about a possible tachycardia mediated cardiomyopathy if we do not get her back into normal rhythm.  We Keondra Haydu plan for cardioversion.  2.  Chronic systolic heart failure: Ejection fraction is improved.  Currently on Toprol-XL, eplerenone, Lasix, Entresto.  No obvious volume overload.    3.  Coronary artery disease: Calcium score in the 82nd percent.  Continue Crestor.    4.  COPD: Complete cessation encouraged  5.  OSA: CPAP compliance encouraged   Current medicines are reviewed at length with the patient today.   The patient does not have concerns regarding her medicines.  The following changes were made today: None  Labs/ tests ordered today include:  Orders Placed This Encounter  Procedures  . Basic metabolic panel  . CBC  . EKG 12-Lead     Disposition:   FU with Tranquilino Fischler 3 months  Signed, Jennelle Pinkstaff [name], MD  06/14/2020 9:58 AM     [facility] [address] [place] [address] [phone] (office) [phone] (fax)

## 2020-06-14 ENCOUNTER — Other Ambulatory Visit: Payer: Self-pay

## 2020-06-14 ENCOUNTER — Ambulatory Visit: Payer: Medicare Other | Admitting: Cardiology

## 2020-06-14 ENCOUNTER — Encounter: Payer: Self-pay | Admitting: Cardiology

## 2020-06-14 VITALS — BP 132/62 | HR 122 | Ht 66.0 in | Wt 260.0 lb

## 2020-06-14 DIAGNOSIS — I484 Atypical atrial flutter: Secondary | ICD-10-CM | POA: Diagnosis not present

## 2020-06-14 DIAGNOSIS — Z01812 Encounter for preprocedural laboratory examination: Secondary | ICD-10-CM | POA: Diagnosis not present

## 2020-06-14 LAB — CBC
Hematocrit: 47.6 % — ABNORMAL HIGH (ref 34.0–46.6)
Hemoglobin: 16 g/dL — ABNORMAL HIGH (ref 11.1–15.9)
MCH: 30.7 pg (ref 26.6–33.0)
MCHC: 33.6 g/dL (ref 31.5–35.7)
MCV: 91 fL (ref 79–97)
Platelets: 308 10*3/uL (ref 150–450)
RBC: 5.21 x10E6/uL (ref 3.77–5.28)
RDW: 14.5 % (ref 11.7–15.4)
WBC: 9.4 10*3/uL (ref 3.4–10.8)

## 2020-06-14 LAB — BASIC METABOLIC PANEL
BUN/Creatinine Ratio: 25 (ref 12–28)
BUN: 24 mg/dL (ref 8–27)
CO2: 31 mmol/L — ABNORMAL HIGH (ref 20–29)
Calcium: 9.5 mg/dL (ref 8.7–10.3)
Chloride: 98 mmol/L (ref 96–106)
Creatinine, Ser: 0.97 mg/dL (ref 0.57–1.00)
GFR calc Af Amer: 65 mL/min/{1.73_m2} (ref >59)
GFR calc non Af Amer: 56 mL/min/{1.73_m2} — ABNORMAL LOW (ref >59)
Glucose: 114 mg/dL — ABNORMAL HIGH (ref 65–99)
Potassium: 4.6 mmol/L (ref 3.5–5.2)
Sodium: 140 mmol/L (ref 134–144)

## 2020-06-14 NOTE — Patient Instructions (Addendum)
Medication Instructions:  Your physician recommends that you continue on your current medications as directed. Please refer to the Current Medication list given to you today.  *If you need a refill on your cardiac medications before your next appointment, please call your pharmacy*   Lab Work: Pre procedure labs today: BMET & CBC If you have labs (blood work) drawn today and your tests are completely normal, you will receive your results only by: Marland Kitchen MyChart Message (if you have MyChart) OR . A paper copy in the mail If you have any lab test that is abnormal or we need to change your treatment, we will call you to review the results.   COVID TEST-- On 06/21/20 @ 11:00 am - You will go to [facility] for your Covid testing.   This is a drive thru test site, stay in your car and the nurse team will come to your car to test you.  After you are tested please go home and self quarantine until the day of your procedure.      Testing/Procedures: Your physician has recommended that you have a Cardioversion (DCCV). Electrical Cardioversion uses a jolt of electricity to your heart either through paddles or wired patches attached to your chest. This is a controlled, usually prescheduled, procedure. Defibrillation is done under light anesthesia in the hospital, and you usually go home the day of the procedure. This is done to get your heart back into a normal rhythm. You are not awake for the procedure. Please see the instruction letter located below under "other instructions".    Follow-Up: At [facility], you and your health needs are our priority.  As part of our continuing mission to provide you with exceptional heart care, we have created designated Provider Care Teams.  These Care Teams include your primary Cardiologist (physician) and Advanced Practice Providers (APPs -  Physician Assistants and Nurse Practitioners) who all work together to provide you with the care you  need, when you need it.  Your next appointment:   1 month(s) after your cardioversion on 12/8  The format for your next appointment:   In Person  Provider:   You will follow up in the [facility] located at [facility] Your provider will be: Roderic Palau, NP or Clint R. Fenton, PA-C    Thank you for choosing [facility]!!   Trinidad Curet, RN [phone]   Other Instructions You are scheduled for a Cardioversion on 06/23/20 with Dr. Johney Frame.  Please arrive at the [facility] (Main Entrance A) at [facility]: [address] [place], [place] [address] at 8:00 am  DIET: Nothing to eat or drink after midnight except a sip of water with medications (see medication instructions below)  Medication Instructions: Take 1/2 your usual dose of bedtime insulin the night before Hold the following medications the morning of this procedure:                 1.  Xigduo   2. Entresto   3. Lantus  Continue your anticoagulant: Eliquis You will need to continue your anticoagulant after your procedure until you are told by your provider that it is safe to stop   Labs:  Today  You must have a responsible person to drive you home and stay in the waiting area during your procedure. Failure to do so could result in cancellation.  Bring your insurance cards.  *Special Note: Every effort is made to have your  procedure done on time. Occasionally there are emergencies that occur at the hospital that may cause delays. Please be patient if a delay does occur.      Electrical Cardioversion Electrical cardioversion is the delivery of a jolt of electricity to restore a normal rhythm to the heart. A rhythm that is too fast or is not regular keeps the heart from pumping well. In this procedure, sticky patches or metal paddles are placed on the chest to deliver electricity to the heart from a device. This procedure may be done in an emergency if:  There is low or no  blood pressure as a result of the heart rhythm.  Normal rhythm must be restored as fast as possible to protect the brain and heart from further damage.  It may save a life. This may also be a scheduled procedure for irregular or fast heart rhythms that are not immediately life-threatening. Tell a health care provider about:  Any allergies you have.  All medicines you are taking, including vitamins, herbs, eye drops, creams, and over-the-counter medicines.  Any problems you or family members have had with anesthetic medicines.  Any blood disorders you have.  Any surgeries you have had.  Any medical conditions you have.  Whether you are pregnant or may be pregnant. What are the risks? Generally, this is a safe procedure. However, problems may occur, including:  Allergic reactions to medicines.  A blood clot that breaks free and travels to other parts of your body.  The possible return of an abnormal heart rhythm within hours or days after the procedure.  Your heart stopping (cardiac arrest). This is rare. What happens before the procedure? Medicines  Your health care provider may have you start taking: ? Blood-thinning medicines (anticoagulants) so your blood does not clot as easily. ? Medicines to help stabilize your heart rate and rhythm.  Ask your health care provider about: ? Changing or stopping your regular medicines. This is especially important if you are taking diabetes medicines or blood thinners. ? Taking medicines such as aspirin and ibuprofen. These medicines can thin your blood. Do not take these medicines unless your health care provider tells you to take them. ? Taking over-the-counter medicines, vitamins, herbs, and supplements. General instructions  Follow instructions from your health care provider about eating or drinking restrictions.  Plan to have someone take you home from the hospital or clinic.  If you will be going home right after the  procedure, plan to have someone with you for 24 hours.  Ask your health care provider what steps will be taken to help prevent infection. These may include washing your skin with a germ-killing soap. What happens during the procedure?   An IV will be inserted into one of your veins.  Sticky patches (electrodes) or metal paddles may be placed on your chest.  You will be given a medicine to help you relax (sedative).  An electrical shock will be delivered. The procedure may vary among health care providers and hospitals. What can I expect after the procedure?  Your blood pressure, heart rate, breathing rate, and blood oxygen level will be monitored until you leave the hospital or clinic.  Your heart rhythm will be watched to make sure it does not change.  You may have some redness on the skin where the shocks were given. Follow these instructions at home:  Do not drive for 24 hours if you were given a sedative during your procedure.  Take over-the-counter and prescription medicines  only as told by your health care provider.  Ask your health care provider how to check your pulse. Check it often.  Rest for 48 hours after the procedure or as told by your health care provider.  Avoid or limit your caffeine use as told by your health care provider.  Keep all follow-up visits as told by your health care provider. This is important. Contact a health care provider if:  You feel like your heart is beating too quickly or your pulse is not regular.  You have a serious muscle cramp that does not go away. Get help right away if:  You have discomfort in your chest.  You are dizzy or you feel faint.  You have trouble breathing or you are short of breath.  Your speech is slurred.  You have trouble moving an arm or leg on one side of your body.  Your fingers or toes turn cold or blue. Summary  Electrical cardioversion is the delivery of a jolt of electricity to restore a normal  rhythm to the heart.  This procedure may be done right away in an emergency or may be a scheduled procedure if the condition is not an emergency.  Generally, this is a safe procedure.  After the procedure, check your pulse often as told by your health care provider. This information is not intended to replace advice given to you by your health care provider. Make sure you discuss any questions you have with your health care provider. Document Revised: 02/03/2019 Document Reviewed: 02/03/2019 Elsevier Patient Education  [place].

## 2020-06-21 ENCOUNTER — Other Ambulatory Visit (HOSPITAL_COMMUNITY)
Admission: RE | Admit: 2020-06-21 | Discharge: 2020-06-21 | Disposition: A | Discharge status: Home / Self Care | Payer: Medicare Other | Source: Ambulatory Visit | Attending: Cardiology | Admitting: Cardiology

## 2020-06-21 DIAGNOSIS — Z01812 Encounter for preprocedural laboratory examination: Secondary | ICD-10-CM | POA: Insufficient documentation

## 2020-06-21 DIAGNOSIS — Z20822 Contact with and (suspected) exposure to covid-19: Secondary | ICD-10-CM | POA: Diagnosis not present

## 2020-06-21 LAB — SARS CORONAVIRUS 2 (TAT 6-24 HRS): SARS Coronavirus 2: NEGATIVE

## 2020-06-23 ENCOUNTER — Ambulatory Visit (HOSPITAL_COMMUNITY): Payer: Medicare Other | Admitting: Certified Registered"

## 2020-06-23 ENCOUNTER — Ambulatory Visit (HOSPITAL_COMMUNITY)
Admission: RE | Admit: 2020-06-23 | Discharge: 2020-06-23 | Disposition: A | Discharge status: Home / Self Care | Payer: Medicare Other | Attending: Cardiology | Admitting: Cardiology

## 2020-06-23 ENCOUNTER — Telehealth: Payer: Self-pay | Admitting: Internal Medicine

## 2020-06-23 ENCOUNTER — Encounter (HOSPITAL_COMMUNITY)
Admission: RE | Disposition: A | Discharge status: Home / Self Care | Payer: Self-pay | Source: Home / Self Care | Attending: Cardiology

## 2020-06-23 ENCOUNTER — Encounter (HOSPITAL_COMMUNITY): Payer: Self-pay | Admitting: Cardiology

## 2020-06-23 DIAGNOSIS — Z79899 Other long term (current) drug therapy: Secondary | ICD-10-CM | POA: Diagnosis not present

## 2020-06-23 DIAGNOSIS — F17210 Nicotine dependence, cigarettes, uncomplicated: Secondary | ICD-10-CM | POA: Diagnosis not present

## 2020-06-23 DIAGNOSIS — G4733 Obstructive sleep apnea (adult) (pediatric): Secondary | ICD-10-CM | POA: Diagnosis not present

## 2020-06-23 DIAGNOSIS — Z794 Long term (current) use of insulin: Secondary | ICD-10-CM | POA: Diagnosis not present

## 2020-06-23 DIAGNOSIS — I2510 Atherosclerotic heart disease of native coronary artery without angina pectoris: Secondary | ICD-10-CM | POA: Insufficient documentation

## 2020-06-23 DIAGNOSIS — J449 Chronic obstructive pulmonary disease, unspecified: Secondary | ICD-10-CM | POA: Insufficient documentation

## 2020-06-23 DIAGNOSIS — I110 Hypertensive heart disease with heart failure: Secondary | ICD-10-CM | POA: Diagnosis not present

## 2020-06-23 DIAGNOSIS — Z7901 Long term (current) use of anticoagulants: Secondary | ICD-10-CM | POA: Insufficient documentation

## 2020-06-23 DIAGNOSIS — I4819 Other persistent atrial fibrillation: Secondary | ICD-10-CM | POA: Diagnosis present

## 2020-06-23 DIAGNOSIS — I4892 Unspecified atrial flutter: Secondary | ICD-10-CM | POA: Diagnosis not present

## 2020-06-23 DIAGNOSIS — I5042 Chronic combined systolic (congestive) and diastolic (congestive) heart failure: Secondary | ICD-10-CM | POA: Insufficient documentation

## 2020-06-23 HISTORY — PX: CARDIOVERSION: SHX1299

## 2020-06-23 LAB — GLUCOSE, CAPILLARY: Glucose-Capillary: 147 mg/dL — ABNORMAL HIGH (ref 70–99)

## 2020-06-23 SURGERY — CARDIOVERSION
Anesthesia: General

## 2020-06-23 MED ORDER — PROPOFOL 10 MG/ML IV BOLUS
INTRAVENOUS | Status: DC | PRN
  Administered 2020-06-23: 65 mg via INTRAVENOUS

## 2020-06-23 MED ORDER — LIDOCAINE 2% (20 MG/ML) 5 ML SYRINGE
INTRAMUSCULAR | Status: DC | PRN
  Administered 2020-06-23: 40 mg via INTRAVENOUS

## 2020-06-23 MED ORDER — SODIUM CHLORIDE 0.9 % IV SOLN: INTRAVENOUS | Status: DC | PRN

## 2020-06-23 NOTE — Transfer of Care (Signed)
Immediate Anesthesia Transfer of Care Note  Patient: Heidi Stark  Procedure(s) Performed: CARDIOVERSION (N/A )  Patient Location: Endoscopy Unit  Anesthesia Type:General  Level of Consciousness: drowsy and patient cooperative  Airway & Oxygen Therapy: Patient Spontanous Breathing  Post-op Assessment: Report given to RN and Post -op Vital signs reviewed and stable  Post vital signs: Reviewed and stable  Last Vitals:  Vitals Value Taken Time  BP    Temp 36.6 C 06/23/20 0858  Pulse    Resp    SpO2      Last Pain:  Vitals:   06/23/20 0858  TempSrc: Temporal         Complications: No complications documented.

## 2020-06-23 NOTE — Procedures (Signed)
Procedure: Electrical Cardioversion Indications:  Atrial Flutter  Procedure Details:  Consent: Risks of procedure as well as the alternatives and risks of each were explained to the (patient/caregiver).  Consent for procedure obtained.  Time Out: Verified patient identification, verified procedure, site/side was marked, verified correct patient position, special equipment/implants available, medications/allergies/relevent history reviewed, required imaging and test results available. PERFORMED.  Patient placed on cardiac monitor, pulse oximetry, supplemental oxygen as necessary.  Sedation given: lidocaine 40mg ; propofol 45mg  administered by anesthesia Pacer pads placed anterior and posterior chest.  Cardioverted 1 time(s).  Cardioversion with synchronized biphasic 200J shock.  Evaluation: Findings: Post procedure EKG shows: NSR Complications: None Patient did tolerate procedure well.  Time Spent Directly with the Patient:  48minutes   [name] 06/23/2020, 9:00 AM

## 2020-06-23 NOTE — Telephone Encounter (Signed)
Patient states she is returning a call. I don't see any notes.

## 2020-06-23 NOTE — Anesthesia Postprocedure Evaluation (Signed)
Anesthesia Post Note  Patient: Heidi Stark  Procedure(s) Performed: CARDIOVERSION (N/A )     Patient location during evaluation: Endoscopy Anesthesia Type: General Level of consciousness: awake Pain management: pain level controlled Respiratory status: spontaneous breathing Cardiovascular status: stable Postop Assessment: no apparent nausea or vomiting Anesthetic complications: no   No complications documented.  Last Vitals:  Vitals:   06/23/20 0910 06/23/20 0917  BP: (!) 99/58 99/65  Pulse: 78 78  Resp: 18 19  Temp:    SpO2: 93% 96%    Last Pain:  Vitals:   06/23/20 0917  TempSrc:   PainSc: 0-No pain                 Analeigha Nauman

## 2020-06-23 NOTE — Interval H&P Note (Signed)
History and Physical Interval Note:  06/23/2020 8:44 AM  Heidi Stark  has presented today for surgery, with the diagnosis of AFIB.  The various methods of treatment have been discussed with the patient and family. After consideration of risks, benefits and other options for treatment, the patient has consented to  Procedure(s): CARDIOVERSION (N/A) as a surgical intervention.  The patient's history has been reviewed, patient examined, no change in status, stable for surgery.  I have reviewed the patient's chart and labs.  Questions were answered to the patient's satisfaction.     [name]

## 2020-06-23 NOTE — Anesthesia Preprocedure Evaluation (Signed)
Anesthesia Evaluation  Patient identified by MRN, date of birth, ID band Patient awake    Airway Mallampati: II  TM Distance: >3 FB     Dental   Pulmonary shortness of breath, sleep apnea , pneumonia, COPD, former smoker,    breath sounds clear to auscultation       Cardiovascular hypertension, + Peripheral Vascular Disease and +CHF  + dysrhythmias Atrial Fibrillation  Rhythm:Irregular Rate:Normal     Neuro/Psych    GI/Hepatic negative GI ROS, Neg liver ROS,   Endo/Other  diabetes  Renal/GU negative Renal ROS     Musculoskeletal   Abdominal   Peds  Hematology   Anesthesia Other Findings   Reproductive/Obstetrics                             Anesthesia Physical Anesthesia Plan  ASA: III  Anesthesia Plan: General   Post-op Pain Management:    Induction: Intravenous  PONV Risk Score and Plan: Propofol infusion  Airway Management Planned: Nasal Cannula and Simple Face Mask  Additional Equipment:   Intra-op Plan:   Post-operative Plan:   Informed Consent: I have reviewed the patients History and Physical, chart, labs and discussed the procedure including the risks, benefits and alternatives for the proposed anesthesia with the patient or authorized representative who has indicated his/her understanding and acceptance.     Dental advisory given  Plan Discussed with: CRNA, Anesthesiologist and Surgeon  Anesthesia Plan Comments:         Anesthesia Quick Evaluation

## 2020-06-27 ENCOUNTER — Encounter: Payer: Self-pay | Admitting: Family Medicine

## 2020-06-28 NOTE — Telephone Encounter (Signed)
She should have ov --- in person or virtual is fine if she has no problem getting into visit

## 2020-06-29 ENCOUNTER — Telehealth (INDEPENDENT_AMBULATORY_CARE_PROVIDER_SITE_OTHER): Payer: Medicare Other | Admitting: Family Medicine

## 2020-06-29 ENCOUNTER — Other Ambulatory Visit: Payer: Self-pay

## 2020-06-29 ENCOUNTER — Encounter: Payer: Self-pay | Admitting: Family Medicine

## 2020-06-29 VITALS — Temp 97.6°F | Ht 66.0 in | Wt 250.0 lb

## 2020-06-29 DIAGNOSIS — L0291 Cutaneous abscess, unspecified: Secondary | ICD-10-CM | POA: Diagnosis not present

## 2020-06-29 MED ORDER — DOXYCYCLINE HYCLATE 100 MG PO TABS: 100.0000 mg | ORAL_TABLET | Freq: Two times a day (BID) | ORAL | 0 refills | Status: DC

## 2020-06-29 NOTE — Progress Notes (Signed)
Virtual Visit via Video Note  I connected with [name] on 06/29/20 at  4:40 PM EST by a video enabled telemedicine application and verified that I am speaking with the correct person using two identifiers.  Location: Patient: home alone  Provider: office    I discussed the limitations of evaluation and management by telemedicine and the availability of in person appointments. The patient expressed understanding and agreed to proceed.  History of Present Illness: Pt is home c/o sore on her thigh from her insulin pen---  It burst this am    Observations/Objective: Vitals:   06/29/20 1617  Temp: 97.6 F (36.4 C)   L thigh -- + abscess,  + errythema ---  No pain in thigh   Assessment and Plan: 1. Abscess Warm compresses  Take abx per orders and call to be seen if no improvement  - doxycycline (VIBRA-TABS) 100 MG tablet; Take 1 tablet (100 mg total) by mouth 2 (two) times daily.  Dispense: 20 tablet; Refill: 0   Follow Up Instructions:    I discussed the assessment and treatment plan with the patient. The patient was provided an opportunity to ask questions and all were answered. The patient agreed with the plan and demonstrated an understanding of the instructions.   The patient was advised to call back or seek an in-person evaluation if the symptoms worsen or if the condition fails to improve as anticipated.  I provided 25 minutes of non-face-to-face time during this encounter.   Ann Held, DO

## 2020-07-26 ENCOUNTER — Ambulatory Visit: Payer: Medicare Other | Admitting: Cardiology

## 2020-07-27 ENCOUNTER — Ambulatory Visit: Payer: Medicare Other | Admitting: Cardiology

## 2020-07-28 ENCOUNTER — Ambulatory Visit (HOSPITAL_COMMUNITY)
Admission: RE | Admit: 2020-07-28 | Discharge: 2020-07-28 | Disposition: A | Discharge status: Home / Self Care | Payer: Medicare Other | Source: Ambulatory Visit | Attending: Nurse Practitioner | Admitting: Nurse Practitioner

## 2020-07-28 ENCOUNTER — Encounter (HOSPITAL_COMMUNITY): Payer: Self-pay | Admitting: Nurse Practitioner

## 2020-07-28 ENCOUNTER — Other Ambulatory Visit: Payer: Self-pay

## 2020-07-28 VITALS — BP 118/54 | HR 68 | Ht 66.0 in | Wt 255.2 lb

## 2020-07-28 DIAGNOSIS — Z79899 Other long term (current) drug therapy: Secondary | ICD-10-CM | POA: Insufficient documentation

## 2020-07-28 DIAGNOSIS — Z87891 Personal history of nicotine dependence: Secondary | ICD-10-CM | POA: Insufficient documentation

## 2020-07-28 DIAGNOSIS — Z7901 Long term (current) use of anticoagulants: Secondary | ICD-10-CM | POA: Diagnosis not present

## 2020-07-28 DIAGNOSIS — I4819 Other persistent atrial fibrillation: Secondary | ICD-10-CM | POA: Diagnosis not present

## 2020-07-28 DIAGNOSIS — I4891 Unspecified atrial fibrillation: Secondary | ICD-10-CM | POA: Diagnosis present

## 2020-07-28 DIAGNOSIS — I10 Essential (primary) hypertension: Secondary | ICD-10-CM | POA: Insufficient documentation

## 2020-07-28 DIAGNOSIS — Z881 Allergy status to other antibiotic agents status: Secondary | ICD-10-CM | POA: Diagnosis not present

## 2020-07-28 DIAGNOSIS — D6869 Other thrombophilia: Secondary | ICD-10-CM | POA: Diagnosis not present

## 2020-07-28 LAB — MAGNESIUM: Magnesium: 2.4 mg/dL (ref 1.7–2.4)

## 2020-07-28 NOTE — Progress Notes (Addendum)
Primary Care Physician: [name], [name], DO Referring Physician: Dr. Beola Cord Heidi Stark is a 80 y.o. female with a h/o afib that is here to f/u cardioversion from 12/8 that was scheduled by Dr. [name] 11/29. She remains  in SR.  No complaints today. Remains on dofetilide 125 mcg bid and eliquis 5 mg bid for a CHA2DS2VASc score of 5.   Today, she denies symptoms of palpitations, chest pain, shortness of breath, orthopnea, PND, lower extremity edema, dizziness, presyncope, syncope, or neurologic sequela. The patient is tolerating medications without difficulties and is otherwise without complaint today.   Past Medical History:  Diagnosis Date  . A-fib ([place])   . Arthritis    "hands, wrists" (11/07/2016)  . CHF (congestive heart failure) ([place])   . Chronic lower back pain   . Constipation   . COPD (chronic obstructive pulmonary disease) ([place])   . DVT (deep venous thrombosis) ([place])   . Dyspnea   . History of blood transfusion 1990   "related to OR"  . History of kidney stones   . Hyperlipidemia   . Hypertension   . Joint pain   . Lower extremity edema   . OSA on CPAP   . Persistent atrial fibrillation ([place])   . Pneumonia    "several times" (11/07/2016)  . Type II diabetes mellitus ([place])    Past Surgical History:  Procedure Laterality Date  . ATRIAL FIBRILLATION ABLATION N/A 11/07/2016   Procedure: Atrial Fibrillation Ablation;  Surgeon: [name], MD;  Location: [facility];  Service: Cardiovascular;  Laterality: N/A;  . BACK SURGERY    . CARDIAC CATHETERIZATION    . CARDIOVERSION N/A 08/11/2016   Procedure: CARDIOVERSION;  Surgeon: Larey Dresser, MD;  Location: [facility] ENDOSCOPY;  Service: Cardiovascular;  Laterality: N/A;  . CARDIOVERSION N/A 12/03/2017   Procedure: CARDIOVERSION;  Surgeon: Larey Dresser, MD;  Location: [facility] ENDOSCOPY;  Service: Cardiovascular;  Laterality: N/A;  . CARDIOVERSION N/A 05/02/2018   Procedure: CARDIOVERSION;  Surgeon:  Pixie Casino, MD;  Location: [facility] ENDOSCOPY;  Service: Cardiovascular;  Laterality: N/A;  . CARDIOVERSION N/A 06/23/2020   Procedure: CARDIOVERSION;  Surgeon: [name], MD;  Location: [facility] ENDOSCOPY;  Service: Cardiovascular;  Laterality: N/A;  . CARPAL TUNNEL RELEASE    . CATARACT EXTRACTION W/ INTRAOCULAR LENS  IMPLANT, BILATERAL Bilateral   . COLON SURGERY  1990   vein graft and colon repair after nicked artery with back surgert  . COLONOSCOPY    . CYSTECTOMY     between bladder and kidneys  . GANGLION CYST EXCISION Left   . KNEE CARTILAGE SURGERY Right 1960s  . [place] SURGERY  1990  . REPAIR ILIAC ARTERY  1990   vein graft and colon repair after nicked artery with back surgert  . TEE WITHOUT CARDIOVERSION N/A 10/31/2013   Procedure: TRANSESOPHAGEAL ECHOCARDIOGRAM (TEE);  Surgeon: Larey Dresser, MD;  Location: [facility];  Service: Cardiovascular;  Laterality: N/A;  . TUBAL LIGATION      Current Outpatient Medications  Medication Sig Dispense Refill  . acetaminophen (TYLENOL) 500 MG tablet Take 500 mg by mouth every 6 (six) hours as needed for moderate pain or headache.    . Dapagliflozin-metFORMIN HCl ER (XIGDUO XR) 11-998 MG TB24 Take 1 tablet by mouth in the morning and at bedtime.    . dofetilide (TIKOSYN) 125 MCG capsule TAKE 1 CAPSULE BY MOUTH TWICE A DAY (Patient taking differently: Take 125 mcg by mouth  2 (two) times daily.) 180 capsule 2  . ELIQUIS 5 MG TABS tablet TAKE 1 TABLET BY MOUTH TWICE A DAY (Patient taking differently: Take 5 mg by mouth 2 (two) times daily.) 60 tablet 11  . ENTRESTO 49-51 MG TAKE 1 TABLET BY MOUTH TWICE A DAY (Patient taking differently: Take 1 tablet by mouth 2 (two) times daily.) 180 tablet 3  . eplerenone (INSPRA) 25 MG tablet TAKE 1 TABLET BY MOUTH EVERY DAY (Patient taking differently: Take 25 mg by mouth daily.) 90 tablet 3  . fenofibrate (TRICOR) 145 MG tablet TAKE 1 TABLET BY MOUTH EVERY DAY (Patient taking differently:  Take 145 mg by mouth daily.) 90 tablet 3  . furosemide (LASIX) 40 MG tablet Take 1.5 tablets (60 mg total) by mouth 2 (two) times daily. 270 tablet 3  . insulin glargine (LANTUS) 100 UNIT/ML injection Inject 15 Units into the skin at bedtime.     . Insulin Pen Needle (BD PEN NEEDLE NANO U/F) 32G X 4 MM MISC See admin instructions.    Marland Kitchen LIVALO 4 MG TABS TAKE 1 TABLET BY MOUTH EVERY DAY (Patient taking differently: Take 4 mg by mouth daily.) 90 tablet 3  . metoprolol succinate (TOPROL-XL) 25 MG 24 hr tablet TAKE 1 TABLET BY MOUTH TWICE A DAY (Patient taking differently: Take 25 mg by mouth 2 (two) times daily.) 180 tablet 3  . Multiple Vitamin (MULTIVITAMIN WITH MINERALS) TABS tablet Take 2 tablets by mouth daily. immuneti otc multivitamin    . potassium chloride (KLOR-CON M10) 10 MEQ tablet Take 2 tablets (20 mEq total) by mouth 2 (two) times daily. 120 tablet 11  . rosuvastatin (CRESTOR) 10 MG tablet Take 1 tablet by mouth daily.    Marland Kitchen tobramycin-dexamethasone (TOBRADEX) ophthalmic solution Place 1 drop into both eyes daily as needed (redness).     . VASCEPA 1 g capsule TAKE 2 CAPSULES BY MOUTH TWICE A DAY (Patient taking differently: Take 2 g by mouth 2 (two) times daily.) 120 capsule 5   No current facility-administered medications for this encounter.    Allergies  Allergen Reactions  . Neomycin-Bacitracin Zn-Polymyx Rash  . Sulfonamide Derivatives Other (See Comments)    Stomach cramps  . Ciprofloxacin Hives, Itching and Rash  . Spironolactone Rash    Social History   Socioeconomic History  . Marital status: Married    Spouse name: [name]  . Number of children: 2  . Years of education: Not on file  . Highest education level: Not on file  Occupational History  . Occupation: Retired  Tobacco Use  . Smoking status: Former Smoker    Packs/day: 1.00    Years: 50.00    Pack years: 50.00    Types: Cigarettes  . Smokeless tobacco: Never Used  Vaping Use  . Vaping Use: Never used   Substance and Sexual Activity  . Alcohol use: No    Alcohol/week: 0.0 standard drinks  . Drug use: No  . Sexual activity: Not on file  Other Topics Concern  . Not on file  Social History Narrative  . Not on file   Social Determinants of Health   Financial Resource Strain: Not on file  Food Insecurity: Not on file  Transportation Needs: Not on file  Physical Activity: Not on file  Stress: Not on file  Social Connections: Not on file  Intimate Partner Violence: Not on file    Family History  Problem Relation Age of Onset  . Diabetes Mother   . Hypertension Mother   .  Cancer Mother   . Obesity Mother   . Diabetes Father   . Obesity Father   . Diabetes Other   . Melanoma Other   . Factor V Leiden deficiency Daughter     ROS- All systems are reviewed and negative except as per the HPI above  Physical Exam: Vitals:   07/28/20 1426  BP: (!) 118/54  Pulse: 68  Weight: 115.8 kg  Height: 5\' 6"  (1.676 m)   Wt Readings from Last 3 Encounters:  07/28/20 115.8 kg  06/29/20 113.4 kg  06/23/20 113.4 kg    Labs: Lab Results  Component Value Date   NA 140 06/14/2020   K 4.6 06/14/2020   CL 98 06/14/2020   CO2 31 (H) 06/14/2020   GLUCOSE 114 (H) 06/14/2020   BUN 24 06/14/2020   CREATININE 0.97 06/14/2020   CALCIUM 9.5 06/14/2020   MG 2.5 (H) 12/17/2019   Lab Results  Component Value Date   INR 2.7 03/09/2014   Lab Results  Component Value Date   CHOL 220 (H) 12/17/2019   HDL 28 (L) 12/17/2019   LDLCALC 123 (H) 12/17/2019   TRIG 345 (H) 12/17/2019     GEN- The patient is well appearing, alert and oriented x 3 today.   Head- normocephalic, atraumatic Eyes-  Sclera clear, conjunctiva pink Ears- hearing intact Oropharynx- clear Neck- supple, no JVP Lymph- no cervical lymphadenopathy Lungs- Clear to ausculation bilaterally, normal work of breathing Heart- Regular rate and rhythm, no murmurs, rubs or gallops, PMI not laterally displaced GI- soft, NT, ND,  + BS Extremities- no clubbing, cyanosis, or edema MS- no significant deformity or atrophy Skin- no rash or lesion Psych- euthymic mood, full affect Neuro- strength and sensation are intact  EKG-Sinus rhythm with sinus arrhythmia and with first degree AV block    Assessment and Plan: 1. afib Successfully cardioverted and remains in SR  Continue Tikosyn 125 mcg bid, qt stable   Continue metoprolol succinate 25 mg bid  K+ at 4.6 11/29 and mag checked today  2. CHA2DS2VASc score of 5 Continue eliquis 5 mg bid   3. HTN Stable   F/u with Dr. [name] and Dr. Ileana Ladd as scheduled   Geroge Baseman. Bryse Blanchette, [facility] [address] [place], [place] [address] [phone]

## 2020-08-06 ENCOUNTER — Other Ambulatory Visit (HOSPITAL_COMMUNITY): Payer: Self-pay | Admitting: Cardiology

## 2020-08-07 ENCOUNTER — Telehealth: Payer: Self-pay | Admitting: Student

## 2020-08-07 NOTE — Telephone Encounter (Signed)
   Patient called Answering Service with concerns about nasal congestion and which antihistamines she can take. Called and spoke with patient. She reports waking up with a "runny nose" that won't quit. No other symptoms. No fevers. No worsening shortness of breath. She is on Tikosyn and just wanted to confirm what antihistamine she can take with this. Recommended trying Zyrtec, Claritin, or Flonase. Advised staying away from over-the-counter medications that contain phenylephrine or pseudoephedrine. Patient voiced understanding and thanked me for calling.  Darreld Mclean, PA-C 08/07/2020 12:35 PM

## 2020-08-17 LAB — HM DIABETES EYE EXAM

## 2020-09-05 ENCOUNTER — Encounter: Payer: Self-pay | Admitting: Family Medicine

## 2020-09-07 ENCOUNTER — Other Ambulatory Visit: Payer: Self-pay

## 2020-09-07 ENCOUNTER — Emergency Department (HOSPITAL_COMMUNITY): Payer: Medicare Other

## 2020-09-07 ENCOUNTER — Inpatient Hospital Stay (HOSPITAL_COMMUNITY): Payer: Medicare Other

## 2020-09-07 ENCOUNTER — Inpatient Hospital Stay (HOSPITAL_COMMUNITY)
Admission: EM | Admit: 2020-09-07 | Discharge: 2020-09-11 | DRG: 287 | Disposition: A | Discharge status: Home / Self Care | Payer: Medicare Other | Attending: Cardiology | Admitting: Cardiology

## 2020-09-07 ENCOUNTER — Telehealth (HOSPITAL_COMMUNITY): Payer: Self-pay | Admitting: Cardiology

## 2020-09-07 DIAGNOSIS — I472 Ventricular tachycardia, unspecified: Secondary | ICD-10-CM

## 2020-09-07 DIAGNOSIS — Z7901 Long term (current) use of anticoagulants: Secondary | ICD-10-CM | POA: Diagnosis not present

## 2020-09-07 DIAGNOSIS — G8929 Other chronic pain: Secondary | ICD-10-CM | POA: Diagnosis present

## 2020-09-07 DIAGNOSIS — E669 Obesity, unspecified: Secondary | ICD-10-CM | POA: Diagnosis present

## 2020-09-07 DIAGNOSIS — J449 Chronic obstructive pulmonary disease, unspecified: Secondary | ICD-10-CM | POA: Diagnosis present

## 2020-09-07 DIAGNOSIS — G4733 Obstructive sleep apnea (adult) (pediatric): Secondary | ICD-10-CM | POA: Diagnosis present

## 2020-09-07 DIAGNOSIS — I5042 Chronic combined systolic (congestive) and diastolic (congestive) heart failure: Secondary | ICD-10-CM | POA: Diagnosis present

## 2020-09-07 DIAGNOSIS — Z20822 Contact with and (suspected) exposure to covid-19: Secondary | ICD-10-CM | POA: Diagnosis present

## 2020-09-07 DIAGNOSIS — F17200 Nicotine dependence, unspecified, uncomplicated: Secondary | ICD-10-CM | POA: Diagnosis present

## 2020-09-07 DIAGNOSIS — E785 Hyperlipidemia, unspecified: Secondary | ICD-10-CM | POA: Diagnosis present

## 2020-09-07 DIAGNOSIS — I110 Hypertensive heart disease with heart failure: Secondary | ICD-10-CM | POA: Diagnosis present

## 2020-09-07 DIAGNOSIS — Z888 Allergy status to other drugs, medicaments and biological substances status: Secondary | ICD-10-CM

## 2020-09-07 DIAGNOSIS — Z881 Allergy status to other antibiotic agents status: Secondary | ICD-10-CM

## 2020-09-07 DIAGNOSIS — Z79899 Other long term (current) drug therapy: Secondary | ICD-10-CM | POA: Diagnosis not present

## 2020-09-07 DIAGNOSIS — Z8249 Family history of ischemic heart disease and other diseases of the circulatory system: Secondary | ICD-10-CM

## 2020-09-07 DIAGNOSIS — I4819 Other persistent atrial fibrillation: Secondary | ICD-10-CM | POA: Diagnosis present

## 2020-09-07 DIAGNOSIS — Z6841 Body Mass Index (BMI) 40.0 and over, adult: Secondary | ICD-10-CM

## 2020-09-07 DIAGNOSIS — M545 Low back pain, unspecified: Secondary | ICD-10-CM | POA: Diagnosis present

## 2020-09-07 DIAGNOSIS — Z882 Allergy status to sulfonamides status: Secondary | ICD-10-CM

## 2020-09-07 DIAGNOSIS — Z86718 Personal history of other venous thrombosis and embolism: Secondary | ICD-10-CM | POA: Diagnosis not present

## 2020-09-07 DIAGNOSIS — I2510 Atherosclerotic heart disease of native coronary artery without angina pectoris: Secondary | ICD-10-CM

## 2020-09-07 DIAGNOSIS — Z794 Long term (current) use of insulin: Secondary | ICD-10-CM

## 2020-09-07 DIAGNOSIS — Z7984 Long term (current) use of oral hypoglycemic drugs: Secondary | ICD-10-CM

## 2020-09-07 DIAGNOSIS — I428 Other cardiomyopathies: Secondary | ICD-10-CM | POA: Diagnosis present

## 2020-09-07 DIAGNOSIS — Z833 Family history of diabetes mellitus: Secondary | ICD-10-CM

## 2020-09-07 DIAGNOSIS — R69 Illness, unspecified: Secondary | ICD-10-CM

## 2020-09-07 DIAGNOSIS — I484 Atypical atrial flutter: Secondary | ICD-10-CM | POA: Diagnosis present

## 2020-09-07 DIAGNOSIS — R000 Tachycardia, unspecified: Secondary | ICD-10-CM

## 2020-09-07 DIAGNOSIS — E1151 Type 2 diabetes mellitus with diabetic peripheral angiopathy without gangrene: Secondary | ICD-10-CM | POA: Diagnosis present

## 2020-09-07 LAB — URINALYSIS, ROUTINE W REFLEX MICROSCOPIC
Bilirubin Urine: NEGATIVE
Glucose, UA: >=500 mg/dL — AB
Hgb urine dipstick: NEGATIVE
Ketones, ur: NEGATIVE mg/dL
Nitrite: NEGATIVE
Protein, ur: NEGATIVE mg/dL
Specific Gravity, Urine: 1.02 (ref 1.005–1.030)
pH: 5 (ref 5.0–8.0)

## 2020-09-07 LAB — CBC WITH DIFFERENTIAL/PLATELET
Abs Immature Granulocytes: 0.02 10*3/uL (ref 0.00–0.07)
Basophils Absolute: 0 10*3/uL (ref 0.0–0.1)
Basophils Relative: 0 %
Eosinophils Absolute: 0 10*3/uL (ref 0.0–0.5)
Eosinophils Relative: 1 %
HCT: 50.3 % — ABNORMAL HIGH (ref 36.0–46.0)
Hemoglobin: 16.7 g/dL — ABNORMAL HIGH (ref 12.0–15.0)
Immature Granulocytes: 0 %
Lymphocytes Relative: 25 %
Lymphs Abs: 2 10*3/uL (ref 0.7–4.0)
MCH: 31.2 pg (ref 26.0–34.0)
MCHC: 33.2 g/dL (ref 30.0–36.0)
MCV: 93.8 fL (ref 80.0–100.0)
Monocytes Absolute: 0.8 10*3/uL (ref 0.1–1.0)
Monocytes Relative: 10 %
Neutro Abs: 5.2 10*3/uL (ref 1.7–7.7)
Neutrophils Relative %: 64 %
Platelets: 317 10*3/uL (ref 150–400)
RBC: 5.36 MIL/uL — ABNORMAL HIGH (ref 3.87–5.11)
RDW: 15.6 % — ABNORMAL HIGH (ref 11.5–15.5)
WBC: 8.1 10*3/uL (ref 4.0–10.5)
nRBC: 0 % (ref 0.0–0.2)

## 2020-09-07 LAB — I-STAT CHEM 8, ED
BUN: 34 mg/dL — ABNORMAL HIGH (ref 8–23)
Calcium, Ion: 1.02 mmol/L — ABNORMAL LOW (ref 1.15–1.40)
Chloride: 100 mmol/L (ref 98–111)
Creatinine, Ser: 1 mg/dL (ref 0.44–1.00)
Glucose, Bld: 229 mg/dL — ABNORMAL HIGH (ref 70–99)
HCT: 52 % — ABNORMAL HIGH (ref 36.0–46.0)
Hemoglobin: 17.7 g/dL — ABNORMAL HIGH (ref 12.0–15.0)
Potassium: 3.9 mmol/L (ref 3.5–5.1)
Sodium: 138 mmol/L (ref 135–145)
TCO2: 25 mmol/L (ref 22–32)

## 2020-09-07 LAB — TSH: TSH: 3.251 u[IU]/mL (ref 0.350–4.500)

## 2020-09-07 LAB — ECHOCARDIOGRAM COMPLETE
Area-P 1/2: 4.18 cm2
Height: 66 in
S' Lateral: 4.2 cm
Weight: 4000 oz

## 2020-09-07 LAB — COMPREHENSIVE METABOLIC PANEL
ALT: 22 U/L (ref 0–44)
AST: 32 U/L (ref 15–41)
Albumin: 3.6 g/dL (ref 3.5–5.0)
Alkaline Phosphatase: 40 U/L (ref 38–126)
Anion gap: 15 (ref 5–15)
BUN: 27 mg/dL — ABNORMAL HIGH (ref 8–23)
CO2: 25 mmol/L (ref 22–32)
Calcium: 9 mg/dL (ref 8.9–10.3)
Chloride: 97 mmol/L — ABNORMAL LOW (ref 98–111)
Creatinine, Ser: 1.13 mg/dL — ABNORMAL HIGH (ref 0.44–1.00)
GFR, Estimated: 50 mL/min — ABNORMAL LOW (ref >60)
Glucose, Bld: 232 mg/dL — ABNORMAL HIGH (ref 70–99)
Potassium: 4.1 mmol/L (ref 3.5–5.1)
Sodium: 137 mmol/L (ref 135–145)
Total Bilirubin: 0.7 mg/dL (ref 0.3–1.2)
Total Protein: 7.1 g/dL (ref 6.5–8.1)

## 2020-09-07 LAB — BRAIN NATRIURETIC PEPTIDE: B Natriuretic Peptide: 85.9 pg/mL (ref 0.0–100.0)

## 2020-09-07 LAB — LACTIC ACID, PLASMA
Lactic Acid, Venous: 2.2 mmol/L (ref 0.5–1.9)
Lactic Acid, Venous: 1.7 mmol/L (ref 0.5–1.9)

## 2020-09-07 LAB — PROTIME-INR
INR: 1.2 (ref 0.8–1.2)
Prothrombin Time: 15.1 seconds (ref 11.4–15.2)

## 2020-09-07 LAB — TROPONIN I (HIGH SENSITIVITY)
Troponin I (High Sensitivity): 13 ng/L (ref <18)
Troponin I (High Sensitivity): 12 ng/L (ref <18)

## 2020-09-07 LAB — MAGNESIUM: Magnesium: 2.3 mg/dL (ref 1.7–2.4)

## 2020-09-07 LAB — PHOSPHORUS: Phosphorus: 4 mg/dL (ref 2.5–4.6)

## 2020-09-07 LAB — RESP PANEL BY RT-PCR (FLU A&B, COVID) ARPGX2
Influenza A by PCR: NEGATIVE
Influenza B by PCR: NEGATIVE
SARS Coronavirus 2 by RT PCR: NEGATIVE

## 2020-09-07 MED ORDER — DAPAGLIFLOZIN PROPANEDIOL 5 MG PO TABS
5.0000 mg | ORAL_TABLET | Freq: Two times a day (BID) | ORAL | Status: DC
  Administered 2020-09-08 – 2020-09-11 (×6): 5 mg via ORAL
  Filled 2020-09-07 (×11): qty 1

## 2020-09-07 MED ORDER — SODIUM CHLORIDE 0.9% FLUSH: 3.0000 mL | Freq: Two times a day (BID) | INTRAVENOUS | Status: DC

## 2020-09-07 MED ORDER — FUROSEMIDE 40 MG PO TABS
60.0000 mg | ORAL_TABLET | Freq: Two times a day (BID) | ORAL | Status: DC
  Administered 2020-09-08 – 2020-09-11 (×6): 60 mg via ORAL
  Filled 2020-09-07 (×6): qty 1

## 2020-09-07 MED ORDER — FUROSEMIDE 20 MG PO TABS: 60.0000 mg | ORAL_TABLET | Freq: Two times a day (BID) | ORAL | Status: DC

## 2020-09-07 MED ORDER — ICOSAPENT ETHYL 1 G PO CAPS
2.0000 g | ORAL_CAPSULE | Freq: Two times a day (BID) | ORAL | Status: DC
  Administered 2020-09-07 – 2020-09-11 (×7): 2 g via ORAL
  Filled 2020-09-07 (×11): qty 2

## 2020-09-07 MED ORDER — SODIUM CHLORIDE 0.9% FLUSH: 3.0000 mL | INTRAVENOUS | Status: DC | PRN

## 2020-09-07 MED ORDER — SODIUM CHLORIDE 0.9 % IV BOLUS
250.0000 mL | Freq: Once | INTRAVENOUS | Status: AC
  Administered 2020-09-07: 250 mL via INTRAVENOUS

## 2020-09-07 MED ORDER — SODIUM CHLORIDE 0.9 % IV SOLN: Freq: Once | INTRAVENOUS | Status: AC

## 2020-09-07 MED ORDER — FUROSEMIDE 20 MG PO TABS: 60.0000 mg | ORAL_TABLET | Freq: Once | ORAL | Status: DC

## 2020-09-07 MED ORDER — AMIODARONE HCL IN DEXTROSE 360-4.14 MG/200ML-% IV SOLN: 30.0000 mg/h | INTRAVENOUS | Status: DC

## 2020-09-07 MED ORDER — APIXABAN 5 MG PO TABS: 5.0000 mg | ORAL_TABLET | Freq: Two times a day (BID) | ORAL | Status: DC

## 2020-09-07 MED ORDER — ACETAMINOPHEN 500 MG PO TABS: 500.0000 mg | ORAL_TABLET | Freq: Four times a day (QID) | ORAL | Status: DC | PRN

## 2020-09-07 MED ORDER — METOPROLOL SUCCINATE ER 25 MG PO TB24
25.0000 mg | ORAL_TABLET | Freq: Two times a day (BID) | ORAL | Status: DC
  Administered 2020-09-07: 25 mg via ORAL
  Filled 2020-09-07 (×2): qty 1

## 2020-09-07 MED ORDER — DAPAGLIFLOZIN PRO-METFORMIN ER 5-1000 MG PO TB24: 1.0000 | ORAL_TABLET | Freq: Two times a day (BID) | ORAL | Status: DC

## 2020-09-07 MED ORDER — INSULIN GLARGINE 100 UNIT/ML ~~LOC~~ SOLN
15.0000 [IU] | Freq: Every day | SUBCUTANEOUS | Status: DC
  Administered 2020-09-08 – 2020-09-10 (×3): 15 [IU] via SUBCUTANEOUS
  Filled 2020-09-07 (×5): qty 0.15

## 2020-09-07 MED ORDER — POTASSIUM CHLORIDE CRYS ER 20 MEQ PO TBCR
20.0000 meq | EXTENDED_RELEASE_TABLET | Freq: Two times a day (BID) | ORAL | Status: DC
  Administered 2020-09-08 – 2020-09-11 (×6): 20 meq via ORAL
  Filled 2020-09-07 (×7): qty 1

## 2020-09-07 MED ORDER — SACUBITRIL-VALSARTAN 49-51 MG PO TABS
1.0000 | ORAL_TABLET | Freq: Two times a day (BID) | ORAL | Status: DC
  Filled 2020-09-07: qty 1

## 2020-09-07 MED ORDER — AMIODARONE HCL IN DEXTROSE 360-4.14 MG/200ML-% IV SOLN
60.0000 mg/h | INTRAVENOUS | Status: DC
  Administered 2020-09-07 – 2020-09-08 (×2): 30 mg/h via INTRAVENOUS
  Administered 2020-09-08: 60 mg/h via INTRAVENOUS
  Administered 2020-09-08: 30 mg/h via INTRAVENOUS
  Administered 2020-09-09 – 2020-09-10 (×5): 60 mg/h via INTRAVENOUS
  Filled 2020-09-07 (×8): qty 200

## 2020-09-07 MED ORDER — SODIUM CHLORIDE 0.9 % IV SOLN: 250.0000 mL | INTRAVENOUS | Status: DC | PRN

## 2020-09-07 MED ORDER — FUROSEMIDE 20 MG PO TABS
60.0000 mg | ORAL_TABLET | Freq: Once | ORAL | Status: AC
  Administered 2020-09-07: 60 mg via ORAL
  Filled 2020-09-07: qty 3

## 2020-09-07 MED ORDER — ACETAMINOPHEN 325 MG PO TABS: 650.0000 mg | ORAL_TABLET | ORAL | Status: DC | PRN

## 2020-09-07 MED ORDER — AMIODARONE HCL IN DEXTROSE 360-4.14 MG/200ML-% IV SOLN
60.0000 mg/h | INTRAVENOUS | Status: DC
  Administered 2020-09-07: 60 mg/h via INTRAVENOUS
  Filled 2020-09-07: qty 200

## 2020-09-07 MED ORDER — FENOFIBRATE 160 MG PO TABS
160.0000 mg | ORAL_TABLET | Freq: Every day | ORAL | Status: DC
  Administered 2020-09-09 – 2020-09-11 (×3): 160 mg via ORAL
  Filled 2020-09-07 (×4): qty 1

## 2020-09-07 MED ORDER — METFORMIN HCL ER 500 MG PO TB24
1000.0000 mg | ORAL_TABLET | Freq: Two times a day (BID) | ORAL | Status: DC
Start: 2020-09-07 — End: 2020-09-07
  Filled 2020-09-07: qty 2

## 2020-09-07 MED ORDER — ROSUVASTATIN CALCIUM 5 MG PO TABS
10.0000 mg | ORAL_TABLET | Freq: Every day | ORAL | Status: DC
  Administered 2020-09-10 – 2020-09-11 (×2): 10 mg via ORAL
  Filled 2020-09-07 (×6): qty 2

## 2020-09-07 MED ORDER — TOBRAMYCIN-DEXAMETHASONE 0.3-0.1 % OP SUSP: 1.0000 [drp] | Freq: Every day | OPHTHALMIC | Status: DC | PRN

## 2020-09-07 MED ORDER — ONDANSETRON HCL 4 MG/2ML IJ SOLN: 4.0000 mg | Freq: Four times a day (QID) | INTRAMUSCULAR | Status: DC | PRN

## 2020-09-07 NOTE — ED Notes (Signed)
Pt awake of urine sample needed.

## 2020-09-07 NOTE — Progress Notes (Signed)
  Echocardiogram 2D Echocardiogram has been performed.  Heidi Stark 09/07/2020, 4:10 PM

## 2020-09-07 NOTE — ED Notes (Addendum)
Long Run of Vtach

## 2020-09-07 NOTE — ED Notes (Signed)
Pt in long runs of Vtach with rates in the low 200's

## 2020-09-07 NOTE — ED Notes (Signed)
10 beat run of [employer]

## 2020-09-07 NOTE — ED Notes (Addendum)
Pt having run of V-TACH 213BMP,  EKG recorded, pt able to speak in full sentences A&Ox4, pt reports feeling chest discomfort but denies SOB, equal and even respirations. Per this RN starting Amiodarone pt rhythm converted to SR 96 bmp, pt resting with eyes closed at this time. Primary RN Marquita Palms made aware.

## 2020-09-07 NOTE — ED Notes (Signed)
Latest run of vtach lasted from 23:13:20-23:15:55.  Sat's dropped to 75% for a few seconds.  Pt appeared tired but not in distress.

## 2020-09-07 NOTE — Progress Notes (Signed)
Notified for 12 beat run NSVT, asymptomatic, hemodynamically stable otherwise. Chart reviewed in detail - patient is due to start amiodarone per EP team shortly (815pm) based on timing of last Tikosyn dose. Reviewed with o/c MD - discussed with nurse to continue carrying out previous plan as ordered. Livianna Petraglia PA-C

## 2020-09-07 NOTE — H&P (View-Only) (Signed)
Advanced Heart Failure Team Consult Note   Primary Physician: Ann Held, DO PCP-Cardiologist:  Dr. [name]  EP: Dr. 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 [name], Electrophysiology.   80 y/o female w/ h/o atrial fibrillation and atrial flutter, s/p Afib ablation 10/2016 w/ recurrence, ultimately placed on Tikosyn 04/2018. 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 EF 30-35% on echo in 1/18, down from 50-55% in 4/15. EF wasback up to 50% on 4/18 echo and 55% on 6/19 echo. She had coronary CTA in 2018 that showed coronary calcium score in the 82nd percentile with nonobstructive coronary disease. Has known PAD and COPD in the setting of tobacco abuse.   Was in her USOH until roughly 1 week ago when she developed episodes of near syncope and fatigue. Denies associated CP. No dyspnea, palpations or frank syncope. Denies any other recent illness or viral syndromes. Has been fully compliant w/ medications. No s/s of fluid overload, including no recent wt gain or edema.   Earlier today, she developed another severe episode of near syncope and called EMS. While connected to 12 lead monitor, she was went into a WCT w/ HR 222 bpm. No LOC. Was bloused w/ IV amio and rhythm and rate improved, transported to the ED.  WBC 8.1. Hgb 16.7, K 4.1, SCr 1.13, BUN 27, Mg 2.3, TSH 3.3, BNP 86, Hs Trop 13>>12. EKG shows ? Rate controlled 2:1 flutter vs NSR 91 bpm. QT/QTc 443/546 ms. Lactic acid 2.2>>1.7. CXR unremarkable.   EP feels her WCT may have been caused by Tikosyn.    She is A&Ox 3. BP soft in the low 65H systolic. Mentating ok. Distal extremities are warm. Continues to deny chest pain and  dyspnea. Amio gtt has been discontinued as she took a dose of Tikosyn this am. She is being admitted by EP for further monitoring and transition to Amiodarone after Tikosyn washout.    Echo 12/2017 - LVEF 55%. G2DD, RV normal    Review of Systems: [y] = yes, [ ]  = no   . General: Weight gain [ ] ; Weight loss [ ] ; Anorexia [ ] ; Fatigue [ Y]; Fever [ ] ; Chills [ ] ; Weakness [Y ]  . Cardiac: Chest pain/pressure [ ] ; Resting SOB [ ] ; Exertional SOB [ ] ; Orthopnea [ ] ; Pedal Edema [ ] ; Palpitations [ ] ; Syncope [ ] ; Presyncope [ Y]; Paroxysmal nocturnal dyspnea[ ]   . Pulmonary: Cough [ ] ; Wheezing[ ] ; Hemoptysis[ ] ; Sputum [ ] ; Snoring [ ]   . GI: Vomiting[ ] ; Dysphagia[ ] ; Melena[ ] ; Hematochezia [ ] ; Heartburn[ ] ; Abdominal pain [ ] ; Constipation [ ] ; Diarrhea [ ] ; BRBPR [ ]   . GU: Hematuria[ ] ; Dysuria [ ] ; Nocturia[ ]   . Vascular: Pain in legs with walking [ ] ; Pain in feet with lying flat [ ] ; Non-healing sores [ ] ; Stroke [ ] ; TIA [ ] ; Slurred speech [ ] ;  . Neuro: Headaches[ ] ; Vertigo[ ] ; Seizures[ ] ; Paresthesias[ ] ;Blurred vision [ ] ; Diplopia [ ] ; Vision changes [ ]   . Ortho/Skin: Arthritis [ ] ; Joint pain [ ] ; Muscle pain [ ] ; Joint swelling [ ] ; Back Pain [ ] ; Rash [ ]   .  Psych: Depression[ ] ; Anxiety[ ]   . Heme: Bleeding problems [ ] ; Clotting disorders [ ] ; Anemia [ ]   . Endocrine: Diabetes [ ] ; Thyroid dysfunction[ ]   Home Medications Prior to Admission medications   Medication Sig Start Date End Date Taking? Authorizing Provider  acetaminophen (TYLENOL) 500 MG tablet Take 500 mg by mouth every 6 (six) hours as needed for moderate pain or headache.    [provider]  Dapagliflozin-metFORMIN HCl ER (XIGDUO XR) 11-998 MG TB24 Take 1 tablet by mouth in the morning and at bedtime.    [provider]  dofetilide (TIKOSYN) 125 MCG capsule TAKE 1 CAPSULE BY MOUTH TWICE A DAY Patient taking differently: Take 125 mcg by mouth 2 (two) times daily. 04/07/20   Larey Dresser, MD  ELIQUIS 5 MG TABS tablet TAKE 1 TABLET BY MOUTH TWICE A DAY Patient taking differently: Take 5 mg by mouth 2 (two) times daily. 02/13/20   Larey Dresser, MD  ENTRESTO 49-51 MG TAKE 1 TABLET BY MOUTH TWICE A DAY Patient taking differently: Take 1 tablet by mouth 2 (two) times daily. 12/23/19   Larey Dresser, MD  eplerenone (INSPRA) 25 MG tablet TAKE 1 TABLET BY MOUTH EVERY DAY Patient taking differently: Take 25 mg by mouth daily. 03/08/20   Larey Dresser, MD  fenofibrate (TRICOR) 145 MG tablet TAKE 1 TABLET BY MOUTH EVERY DAY Patient taking differently: Take 145 mg by mouth daily. 02/13/20   Larey Dresser, MD  furosemide (LASIX) 40 MG tablet Take 1.5 tablets (60 mg total) by mouth 2 (two) times daily. 01/29/20   Larey Dresser, MD  insulin glargine (LANTUS) 100 UNIT/ML injection Inject 15 Units into the skin at bedtime.     [provider]  Insulin Pen Needle (BD PEN NEEDLE NANO U/F) 32G X 4 MM MISC See admin instructions.    [provider]  LIVALO 4 MG TABS TAKE 1 TABLET BY MOUTH EVERY DAY Patient taking differently: Take 4 mg by mouth daily. 06/14/20   Camnitz, Ocie Doyne, MD  metoprolol succinate (TOPROL-XL) 25 MG 24 hr tablet TAKE 1 TABLET BY MOUTH TWICE A DAY Patient taking differently: Take 25 mg by mouth 2 (two) times daily. 02/02/20   Larey Dresser, MD  Multiple Vitamin (MULTIVITAMIN WITH MINERALS) TABS tablet Take 2 tablets by mouth daily. immuneti otc multivitamin    [provider]  potassium chloride (KLOR-CON M10) 10 MEQ tablet Take 2 tablets (20 mEq total) by mouth 2 (two) times daily. 04/07/20   Larey Dresser, MD  rosuvastatin (CRESTOR) 10 MG tablet Take 1 tablet by mouth daily.    [provider]  tobramycin-dexamethasone [facility]) ophthalmic solution Place 1 drop into both eyes daily as needed (redness).  05/13/20   [provider]  VASCEPA 1 g capsule TAKE 2 CAPSULES BY MOUTH TWICE A DAY 08/06/20   Larey Dresser, MD    Past Medical History: Past Medical History:  Diagnosis Date  . A-fib ([place])   . Arthritis    "hands, wrists" (11/07/2016)  . CHF (congestive heart failure) ([place])   . Chronic lower back pain   . Constipation   . COPD (chronic obstructive pulmonary disease) ([place])   . DVT (deep venous thrombosis) ([place])   . Dyspnea   . History of blood transfusion 1990   "related to OR"  . History of kidney stones   . Hyperlipidemia   . Hypertension   . Joint pain   .  Lower extremity edema   . OSA on CPAP   . Persistent atrial fibrillation ([place])   . Pneumonia    "several times" (11/07/2016)  . Type II diabetes mellitus ([place])     Past Surgical History: Past Surgical History:  Procedure Laterality Date  . ATRIAL FIBRILLATION ABLATION N/A 11/07/2016   Procedure: Atrial Fibrillation Ablation;  Surgeon: [name], MD;  Location: [facility];  Service: Cardiovascular;  Laterality: N/A;  . BACK SURGERY    . CARDIAC CATHETERIZATION    . CARDIOVERSION N/A 08/11/2016   Procedure: CARDIOVERSION;  Surgeon: Larey Dresser, MD;  Location: [facility] ENDOSCOPY;  Service: Cardiovascular;  Laterality: N/A;  . CARDIOVERSION N/A 12/03/2017   Procedure: CARDIOVERSION;  Surgeon: Larey Dresser, MD;  Location: [facility] ENDOSCOPY;  Service: Cardiovascular;  Laterality: N/A;  . CARDIOVERSION N/A 05/02/2018   Procedure: CARDIOVERSION;  Surgeon: Pixie Casino, MD;  Location: [facility] ENDOSCOPY;  Service: Cardiovascular;  Laterality: N/A;  . CARDIOVERSION N/A 06/23/2020   Procedure: CARDIOVERSION;  Surgeon: [name], MD;  Location: [facility] ENDOSCOPY;  Service: Cardiovascular;  Laterality: N/A;  . CARPAL TUNNEL RELEASE    . CATARACT EXTRACTION W/ INTRAOCULAR LENS  IMPLANT, BILATERAL Bilateral   . COLON SURGERY  1990   vein graft and colon repair after nicked artery with back surgert  . COLONOSCOPY    . CYSTECTOMY     between bladder and kidneys  . GANGLION CYST EXCISION Left   . KNEE CARTILAGE SURGERY  Right 1960s  . [place] SURGERY  1990  . REPAIR ILIAC ARTERY  1990   vein graft and colon repair after nicked artery with back surgert  . TEE WITHOUT CARDIOVERSION N/A 10/31/2013   Procedure: TRANSESOPHAGEAL ECHOCARDIOGRAM (TEE);  Surgeon: Larey Dresser, MD;  Location: [facility] ENDOSCOPY;  Service: Cardiovascular;  Laterality: N/A;  . TUBAL LIGATION      Family History: Family History  Problem Relation Age of Onset  . Diabetes Mother   . Hypertension Mother   . Cancer Mother   . Obesity Mother   . Diabetes Father   . Obesity Father   . Diabetes Other   . Melanoma Other   . Factor V Leiden deficiency Daughter     Social History: Social History   Socioeconomic History  . Marital status: Married    Spouse name: [name]  . Number of children: 2  . Years of education: Not on file  . Highest education level: Not on file  Occupational History  . Occupation: Retired  Tobacco Use  . Smoking status: Former Smoker    Packs/day: 1.00    Years: 50.00    Pack years: 50.00    Types: Cigarettes  . Smokeless tobacco: Never Used  Vaping Use  . Vaping Use: Never used  Substance and Sexual Activity  . Alcohol use: No    Alcohol/week: 0.0 standard drinks  . Drug use: No  . Sexual activity: Not on file  Other Topics Concern  . Not on file  Social History Narrative  . Not on file   Social Determinants of Health   Financial Resource Strain: Not on file  Food Insecurity: Not on file  Transportation Needs: Not on file  Physical Activity: Not on file  Stress: Not on file  Social Connections: Not on file    Allergies:  Allergies  Allergen Reactions  . Neomycin-Bacitracin Zn-Polymyx Rash  . Sulfonamide Derivatives Other (See Comments)    Stomach cramps  . Ciprofloxacin Hives, Itching and  Rash  . Spironolactone Rash    Objective:    Vital Signs:   Temp:  [98.5 F (36.9 C)] 98.5 F (36.9 C) (02/22 0937) Pulse Rate:  [75-93] 75 (02/22 1230) Resp:  [15-20] 16 (02/22  1230) BP: (83-122)/(42-70) 83/42 (02/22 1230) SpO2:  [94 %-98 %] 94 % (02/22 1230) Weight:  [113.4 kg] 113.4 kg (02/22 0935)    Weight change: Filed Weights   09/07/20 0935  Weight: 113.4 kg    Intake/Output:  No intake or output data in the 24 hours ending 09/07/20 1245    Physical Exam    General:  Elderly but well appearing. No resp difficulty HEENT: normal Neck: supple. No JVP . Carotids 2+ bilat; no bruits. No lymphadenopathy or thyromegaly appreciated. Cor: PMI nondisplaced. Regular rate & rhythm. No rubs, gallops or murmurs. Lungs: clear Abdomen: soft, nontender, nondistended. No hepatosplenomegaly. No bruits or masses. Good bowel sounds. Extremities: no cyanosis, clubbing, rash, edema + chronic bilateral LE anterior erythema. Distal extremities warm   Neuro: alert & orientedx3, cranial nerves grossly intact. moves all 4 extremities w/o difficulty. Affect pleasant   Telemetry   ? 2:1 atrial flutter 90s   EKG    ? 2:1 atrial flutter vs NSR 91 bpm QT/QTc 443/546 ms  Labs   Basic Metabolic Panel: Recent Labs  Lab 09/07/20 1004 09/07/20 1015  NA 137 138  K 4.1 3.9  CL 97* 100  CO2 25  --   GLUCOSE 232* 229*  BUN 27* 34*  CREATININE 1.13* 1.00  CALCIUM 9.0  --   MG 2.3  --   PHOS 4.0  --     Liver Function Tests: Recent Labs  Lab 09/07/20 1004  AST 32  ALT 22  ALKPHOS 40  BILITOT 0.7  PROT 7.1  ALBUMIN 3.6   No results for input(s): LIPASE, AMYLASE in the last 168 hours. No results for input(s): AMMONIA in the last 168 hours.  CBC: Recent Labs  Lab 09/07/20 1004 09/07/20 1015  WBC 8.1  --   NEUTROABS 5.2  --   HGB 16.7* 17.7*  HCT 50.3* 52.0*  MCV 93.8  --   PLT 317  --     Cardiac Enzymes: No results for input(s): CKTOTAL, CKMB, CKMBINDEX, TROPONINI in the last 168 hours.  BNP: BNP (last 3 results) Recent Labs    09/07/20 1004  BNP 85.9    ProBNP (last 3 results) No results for input(s): PROBNP in the last 8760  hours.   CBG: No results for input(s): GLUCAP in the last 168 hours.  Coagulation Studies: Recent Labs    09/07/20 1004  LABPROT 15.1  INR 1.2     Imaging   DG Chest Port 1 View  Result Date: 09/07/2020 CLINICAL DATA:  Near syncope. EXAM: PORTABLE CHEST 1 VIEW COMPARISON:  08/09/2016. FINDINGS: Cardiomegaly. Low lung volumes. No focal infiltrate. No pleural effusion or pneumothorax. IMPRESSION: Cardiomegaly. No acute pulmonary disease. Electronically Signed   By: Marcello Moores  Register   On: 09/07/2020 10:19      Medications:     Current Medications:   Infusions: . sodium chloride       Assessment/Plan   1. WCT: - ? VT vs Afib w/ Aberrancy, HR 222 bpm, broke w/ IV amio - K 4.1, Mg 2.3, HS trop 13>>12. Post conversion EKG w/ QT/QTc 443/546 ms - no associated ischemic CP  - ? Caused by Tikosyn - EP to admit for further cardiac monitoring - Stop Tikosyn. Transition to amiodarone.  Given COPD consider obtaining baseline PFTs w/ DLCO prior to initiation of therapy  - Keep K > 4.0 and Mg >2.0 - Update Echo to reassess LVEF  - No ACS given negative trop x 2 but consider cardiac cath if drop in EF   2. 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 - given WCT concerning for VT, will update echo to ensure no drop in EF. If EF is reduced, will need cath to r/o obstructive CAD - no signs of volume overload on exam. CXR w/ no edema. BNP 86  - Continue home regimen  Toprol XL 25 mg bid  Entresto 49-51 mg bid   Lasix 60 mg bid   3. Atrial Fibrillation/ Flutter - prior DCCVs and Afib ablation in 2018 - stop Tikosyn w/ WCT  - ? Afib w/ aberrancy. Post conversion EKGs show ? Afib/flutter  - Plan transition to amiodarone - Continue Eliquis 5 mg bid    4. CAD - coronary CTA in 2018 showed coronary calcium score in the  82nd percentile with nonobstructive coronary disease - Hs Trop 13>>12 - denies recent CP  - check echo. If EF reduced, will need cath to r/o obstructive CAD to explain arrhthymias - has not been on ASA due to Eliquis - continue  blocker and lipid lowering agents  5. T2DM -  Continue home regimen  Lantus 15 U daily   Dapagliflozin-Metformin  -  Continue statin   6. OSA - CPAP QHS   Length of Stay: 0  Lyda Jester, PA-C  09/07/2020, 12:45 PM  Advanced Heart Failure Team Pager [phone] (M-F; 7a - 4p)  Please contact [facility] for night-coverage after hours (4p -7a ) and weekends on [email or web address]  Patient seen with PA, agree with the above note.    For about a week, she has had transient spells of lightheadedness and presyncope.  She is not sure if she has ever completely passed out.  Today, she had a severe episode of presyncope and called EMS, noted to be in monomorphic VT. She was bolused with amiodarone and went back into NSR.    Of note, she is on Tikosyn for atrial fibrillation and atrial flutter.  No new meds recently and Tikosyn dose has not changed.  Last DCCV for atrial flutter was in 12/21.    Currently, she is back in atypical atrial flutter with variable block.  She has been started on amiodarone gtt.  Echo was done today (I reviewed), EF appears normal 60-65% with normal RV.  Troponin normal.  Lactate initially elevated at 2.2 but repeat was normal.  Electrolytes normal. SBP has been 90s-100s.   General: NAD Neck: No JVD, no thyromegaly or thyroid nodule.  Lungs: Clear to auscultation bilaterally with normal respiratory effort. CV: Nondisplaced PMI.  Heart irregular S1/S2, no S3/S4, no murmur. Trace ankle edema.  No carotid bruit.  Difficult to palpate pedal pulses.  Abdomen: Soft, nontender, no hepatosplenomegaly, no distention.  Skin: Intact without lesions or rashes.  Neurologic: Alert and oriented x 3.  Psych: Normal affect. Extremities: No clubbing or  cyanosis.  HEENT: Normal.   1. 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  This is not the typical Tikosyn-induced polymorphic VT.  No changes in Tikosyn dosing recently and no new meds.  Echo shows normal EF and  electrolytes normal.  - Think we will need to stop Tikosyn, will transition over to amiodarone per EP.  - As she did not have typical PMVT of Tikosyn, think we need to do an ischemic workup.  I will arrange for coronary angiography tomorrow to rule out significant CAD. Hold apixaban starting today for cath tomorrow.  Discussed risks/benefits with patient and she agrees to procedure.  - If clean cath, ?ICD or hold off as VT would most likely be due to Tikosyn => discuss with EP.  2. Atrial fibrillation/flutter: Paroxysmal. 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 She was admitted in 1/18 with atrial fibrillation/RVR and CHF. She was diuresed and had DCCV back to NSR. She had atrial fibrillation ablation in 4/18. In 5/19, she went into atrial fibrillation transiently. In 10/19, she was back in atrial fibrillation and was admitted for Tikosyn initiation and cardioversion. Atypical atrial flutter noted in 12/21 with DCCV to NSR. Now, back in atypical atrial flutter.  - Holding Eliquis this evening for cath then will restart.  -Switching from Tikosyn to amiodarone.  If she stays in flutter, should get cardioversion in the future.  3. Claudication: Left leg claudication with absent left PT pulse. 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  4. Chronic systolic =>diastolic CHF: EF 40-98% on echo in 1/18, down from 50-55% in 4/15. 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 Echo was done today showing EF 60-65%. NYHA class II symptoms, she is not volume overloaded by exam.  -Hold  tomorrow morning's Lasix dose for cath then restart.   - Continue Toprol XL 25 mg bid.  - Hold Entresto for now with soft BP.  - ?rash from spironolactone, now on eplerenone 25 mg daily.  5. CAD: Coronary CTA in 2018 showed coronary calcium score in the 82nd percentile with nonobstructive coronary disease.   - not on ASA 81 as she is taking Eliqius.  - Continue Vascepa and Livalo.  - See discussion above, will plan LHC to evaluate VT.  6. COPD:  History of smoking.  7. OSA: CPAP.   [name] 09/07/2020 5:18 PM

## 2020-09-07 NOTE — ED Notes (Signed)
Pt had another looooong run of vtach

## 2020-09-07 NOTE — ED Notes (Signed)
Long run of vtach

## 2020-09-07 NOTE — Consult Note (Addendum)
Advanced Heart Failure Team Consult Note   Primary Physician: Ann Held, DO PCP-Cardiologist:  Dr. [name]  EP: Dr. 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 [name], Electrophysiology.   80 y/o female w/ h/o atrial fibrillation and atrial flutter, s/p Afib ablation 10/2016 w/ recurrence, ultimately placed on Tikosyn 04/2018. 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 EF 30-35% on echo in 1/18, down from 50-55% in 4/15. EF wasback up to 50% on 4/18 echo and 55% on 6/19 echo. She had coronary CTA in 2018 that showed coronary calcium score in the 82nd percentile with nonobstructive coronary disease. Has known PAD and COPD in the setting of tobacco abuse.   Was in her USOH until roughly 1 week ago when she developed episodes of near syncope and fatigue. Denies associated CP. No dyspnea, palpations or frank syncope. Denies any other recent illness or viral syndromes. Has been fully compliant w/ medications. No s/s of fluid overload, including no recent wt gain or edema.   Earlier today, she developed another severe episode of near syncope and called EMS. While connected to 12 lead monitor, she was went into a WCT w/ HR 222 bpm. No LOC. Was bloused w/ IV amio and rhythm and rate improved, transported to the ED.  WBC 8.1. Hgb 16.7, K 4.1, SCr 1.13, BUN 27, Mg 2.3, TSH 3.3, BNP 86, Hs Trop 13>>12. EKG shows ? Rate controlled 2:1 flutter vs NSR 91 bpm. QT/QTc 443/546 ms. Lactic acid 2.2>>1.7. CXR unremarkable.   EP feels her WCT may have been caused by Tikosyn.    She is A&Ox 3. BP soft in the low 40X systolic. Mentating ok. Distal extremities are warm. Continues to deny chest pain and  dyspnea. Amio gtt has been discontinued as she took a dose of Tikosyn this am. She is being admitted by EP for further monitoring and transition to Amiodarone after Tikosyn washout.    Echo 12/2017 - LVEF 55%. G2DD, RV normal    Review of Systems: [y] = yes, [ ]  = no   . General: Weight gain [ ] ; Weight loss [ ] ; Anorexia [ ] ; Fatigue [ Y]; Fever [ ] ; Chills [ ] ; Weakness [Y ]  . Cardiac: Chest pain/pressure [ ] ; Resting SOB [ ] ; Exertional SOB [ ] ; Orthopnea [ ] ; Pedal Edema [ ] ; Palpitations [ ] ; Syncope [ ] ; Presyncope [ Y]; Paroxysmal nocturnal dyspnea[ ]   . Pulmonary: Cough [ ] ; Wheezing[ ] ; Hemoptysis[ ] ; Sputum [ ] ; Snoring [ ]   . GI: Vomiting[ ] ; Dysphagia[ ] ; Melena[ ] ; Hematochezia [ ] ; Heartburn[ ] ; Abdominal pain [ ] ; Constipation [ ] ; Diarrhea [ ] ; BRBPR [ ]   . GU: Hematuria[ ] ; Dysuria [ ] ; Nocturia[ ]   . Vascular: Pain in legs with walking [ ] ; Pain in feet with lying flat [ ] ; Non-healing sores [ ] ; Stroke [ ] ; TIA [ ] ; Slurred speech [ ] ;  . Neuro: Headaches[ ] ; Vertigo[ ] ; Seizures[ ] ; Paresthesias[ ] ;Blurred vision [ ] ; Diplopia [ ] ; Vision changes [ ]   . Ortho/Skin: Arthritis [ ] ; Joint pain [ ] ; Muscle pain [ ] ; Joint swelling [ ] ; Back Pain [ ] ; Rash [ ]   .  Psych: Depression[ ] ; Anxiety[ ]   . Heme: Bleeding problems [ ] ; Clotting disorders [ ] ; Anemia [ ]   . Endocrine: Diabetes [ ] ; Thyroid dysfunction[ ]   Home Medications Prior to Admission medications   Medication Sig Start Date End Date Taking? Authorizing Provider  acetaminophen (TYLENOL) 500 MG tablet Take 500 mg by mouth every 6 (six) hours as needed for moderate pain or headache.    [provider]  Dapagliflozin-metFORMIN HCl ER (XIGDUO XR) 11-998 MG TB24 Take 1 tablet by mouth in the morning and at bedtime.    [provider]  dofetilide (TIKOSYN) 125 MCG capsule TAKE 1 CAPSULE BY MOUTH TWICE A DAY Patient taking differently: Take 125 mcg by mouth 2 (two) times daily. 04/07/20   Larey Dresser, MD  ELIQUIS 5 MG TABS tablet TAKE 1 TABLET BY MOUTH TWICE A DAY Patient taking differently: Take 5 mg by mouth 2 (two) times daily. 02/13/20   Larey Dresser, MD  ENTRESTO 49-51 MG TAKE 1 TABLET BY MOUTH TWICE A DAY Patient taking differently: Take 1 tablet by mouth 2 (two) times daily. 12/23/19   Larey Dresser, MD  eplerenone (INSPRA) 25 MG tablet TAKE 1 TABLET BY MOUTH EVERY DAY Patient taking differently: Take 25 mg by mouth daily. 03/08/20   Larey Dresser, MD  fenofibrate (TRICOR) 145 MG tablet TAKE 1 TABLET BY MOUTH EVERY DAY Patient taking differently: Take 145 mg by mouth daily. 02/13/20   Larey Dresser, MD  furosemide (LASIX) 40 MG tablet Take 1.5 tablets (60 mg total) by mouth 2 (two) times daily. 01/29/20   Larey Dresser, MD  insulin glargine (LANTUS) 100 UNIT/ML injection Inject 15 Units into the skin at bedtime.     [provider]  Insulin Pen Needle (BD PEN NEEDLE NANO U/F) 32G X 4 MM MISC See admin instructions.    [provider]  LIVALO 4 MG TABS TAKE 1 TABLET BY MOUTH EVERY DAY Patient taking differently: Take 4 mg by mouth daily. 06/14/20   Camnitz, Ocie Doyne, MD  metoprolol succinate (TOPROL-XL) 25 MG 24 hr tablet TAKE 1 TABLET BY MOUTH TWICE A DAY Patient taking differently: Take 25 mg by mouth 2 (two) times daily. 02/02/20   Larey Dresser, MD  Multiple Vitamin (MULTIVITAMIN WITH MINERALS) TABS tablet Take 2 tablets by mouth daily. immuneti otc multivitamin    [provider]  potassium chloride (KLOR-CON M10) 10 MEQ tablet Take 2 tablets (20 mEq total) by mouth 2 (two) times daily. 04/07/20   Larey Dresser, MD  rosuvastatin (CRESTOR) 10 MG tablet Take 1 tablet by mouth daily.    [provider]  tobramycin-dexamethasone [facility]) ophthalmic solution Place 1 drop into both eyes daily as needed (redness).  05/13/20   [provider]  VASCEPA 1 g capsule TAKE 2 CAPSULES BY MOUTH TWICE A DAY 08/06/20   Larey Dresser, MD    Past Medical History: Past Medical History:  Diagnosis Date  . A-fib ([place])   . Arthritis    "hands, wrists" (11/07/2016)  . CHF (congestive heart failure) ([place])   . Chronic lower back pain   . Constipation   . COPD (chronic obstructive pulmonary disease) ([place])   . DVT (deep venous thrombosis) ([place])   . Dyspnea   . History of blood transfusion 1990   "related to OR"  . History of kidney stones   . Hyperlipidemia   . Hypertension   . Joint pain   .  Lower extremity edema   . OSA on CPAP   . Persistent atrial fibrillation ([place])   . Pneumonia    "several times" (11/07/2016)  . Type II diabetes mellitus ([place])     Past Surgical History: Past Surgical History:  Procedure Laterality Date  . ATRIAL FIBRILLATION ABLATION N/A 11/07/2016   Procedure: Atrial Fibrillation Ablation;  Surgeon: [name], MD;  Location: [facility];  Service: Cardiovascular;  Laterality: N/A;  . BACK SURGERY    . CARDIAC CATHETERIZATION    . CARDIOVERSION N/A 08/11/2016   Procedure: CARDIOVERSION;  Surgeon: Larey Dresser, MD;  Location: [facility] ENDOSCOPY;  Service: Cardiovascular;  Laterality: N/A;  . CARDIOVERSION N/A 12/03/2017   Procedure: CARDIOVERSION;  Surgeon: Larey Dresser, MD;  Location: [facility] ENDOSCOPY;  Service: Cardiovascular;  Laterality: N/A;  . CARDIOVERSION N/A 05/02/2018   Procedure: CARDIOVERSION;  Surgeon: Pixie Casino, MD;  Location: [facility] ENDOSCOPY;  Service: Cardiovascular;  Laterality: N/A;  . CARDIOVERSION N/A 06/23/2020   Procedure: CARDIOVERSION;  Surgeon: [name], MD;  Location: [facility] ENDOSCOPY;  Service: Cardiovascular;  Laterality: N/A;  . CARPAL TUNNEL RELEASE    . CATARACT EXTRACTION W/ INTRAOCULAR LENS  IMPLANT, BILATERAL Bilateral   . COLON SURGERY  1990   vein graft and colon repair after nicked artery with back surgert  . COLONOSCOPY    . CYSTECTOMY     between bladder and kidneys  . GANGLION CYST EXCISION Left   . KNEE CARTILAGE SURGERY  Right 1960s  . [place] SURGERY  1990  . REPAIR ILIAC ARTERY  1990   vein graft and colon repair after nicked artery with back surgert  . TEE WITHOUT CARDIOVERSION N/A 10/31/2013   Procedure: TRANSESOPHAGEAL ECHOCARDIOGRAM (TEE);  Surgeon: Larey Dresser, MD;  Location: [facility] ENDOSCOPY;  Service: Cardiovascular;  Laterality: N/A;  . TUBAL LIGATION      Family History: Family History  Problem Relation Age of Onset  . Diabetes Mother   . Hypertension Mother   . Cancer Mother   . Obesity Mother   . Diabetes Father   . Obesity Father   . Diabetes Other   . Melanoma Other   . Factor V Leiden deficiency Daughter     Social History: Social History   Socioeconomic History  . Marital status: Married    Spouse name: [name]  . Number of children: 2  . Years of education: Not on file  . Highest education level: Not on file  Occupational History  . Occupation: Retired  Tobacco Use  . Smoking status: Former Smoker    Packs/day: 1.00    Years: 50.00    Pack years: 50.00    Types: Cigarettes  . Smokeless tobacco: Never Used  Vaping Use  . Vaping Use: Never used  Substance and Sexual Activity  . Alcohol use: No    Alcohol/week: 0.0 standard drinks  . Drug use: No  . Sexual activity: Not on file  Other Topics Concern  . Not on file  Social History Narrative  . Not on file   Social Determinants of Health   Financial Resource Strain: Not on file  Food Insecurity: Not on file  Transportation Needs: Not on file  Physical Activity: Not on file  Stress: Not on file  Social Connections: Not on file    Allergies:  Allergies  Allergen Reactions  . Neomycin-Bacitracin Zn-Polymyx Rash  . Sulfonamide Derivatives Other (See Comments)    Stomach cramps  . Ciprofloxacin Hives, Itching and  Rash  . Spironolactone Rash    Objective:    Vital Signs:   Temp:  [98.5 F (36.9 C)] 98.5 F (36.9 C) (02/22 0937) Pulse Rate:  [75-93] 75 (02/22 1230) Resp:  [15-20] 16 (02/22  1230) BP: (83-122)/(42-70) 83/42 (02/22 1230) SpO2:  [94 %-98 %] 94 % (02/22 1230) Weight:  [113.4 kg] 113.4 kg (02/22 0935)    Weight change: Filed Weights   09/07/20 0935  Weight: 113.4 kg    Intake/Output:  No intake or output data in the 24 hours ending 09/07/20 1245    Physical Exam    General:  Elderly but well appearing. No resp difficulty HEENT: normal Neck: supple. No JVP . Carotids 2+ bilat; no bruits. No lymphadenopathy or thyromegaly appreciated. Cor: PMI nondisplaced. Regular rate & rhythm. No rubs, gallops or murmurs. Lungs: clear Abdomen: soft, nontender, nondistended. No hepatosplenomegaly. No bruits or masses. Good bowel sounds. Extremities: no cyanosis, clubbing, rash, edema + chronic bilateral LE anterior erythema. Distal extremities warm   Neuro: alert & orientedx3, cranial nerves grossly intact. moves all 4 extremities w/o difficulty. Affect pleasant   Telemetry   ? 2:1 atrial flutter 90s   EKG    ? 2:1 atrial flutter vs NSR 91 bpm QT/QTc 443/546 ms  Labs   Basic Metabolic Panel: Recent Labs  Lab 09/07/20 1004 09/07/20 1015  NA 137 138  K 4.1 3.9  CL 97* 100  CO2 25  --   GLUCOSE 232* 229*  BUN 27* 34*  CREATININE 1.13* 1.00  CALCIUM 9.0  --   MG 2.3  --   PHOS 4.0  --     Liver Function Tests: Recent Labs  Lab 09/07/20 1004  AST 32  ALT 22  ALKPHOS 40  BILITOT 0.7  PROT 7.1  ALBUMIN 3.6   No results for input(s): LIPASE, AMYLASE in the last 168 hours. No results for input(s): AMMONIA in the last 168 hours.  CBC: Recent Labs  Lab 09/07/20 1004 09/07/20 1015  WBC 8.1  --   NEUTROABS 5.2  --   HGB 16.7* 17.7*  HCT 50.3* 52.0*  MCV 93.8  --   PLT 317  --     Cardiac Enzymes: No results for input(s): CKTOTAL, CKMB, CKMBINDEX, TROPONINI in the last 168 hours.  BNP: BNP (last 3 results) Recent Labs    09/07/20 1004  BNP 85.9    ProBNP (last 3 results) No results for input(s): PROBNP in the last 8760  hours.   CBG: No results for input(s): GLUCAP in the last 168 hours.  Coagulation Studies: Recent Labs    09/07/20 1004  LABPROT 15.1  INR 1.2     Imaging   DG Chest Port 1 View  Result Date: 09/07/2020 CLINICAL DATA:  Near syncope. EXAM: PORTABLE CHEST 1 VIEW COMPARISON:  08/09/2016. FINDINGS: Cardiomegaly. Low lung volumes. No focal infiltrate. No pleural effusion or pneumothorax. IMPRESSION: Cardiomegaly. No acute pulmonary disease. Electronically Signed   By: Marcello Moores  Register   On: 09/07/2020 10:19      Medications:     Current Medications:   Infusions: . sodium chloride       Assessment/Plan   1. WCT: - ? VT vs Afib w/ Aberrancy, HR 222 bpm, broke w/ IV amio - K 4.1, Mg 2.3, HS trop 13>>12. Post conversion EKG w/ QT/QTc 443/546 ms - no associated ischemic CP  - ? Caused by Tikosyn - EP to admit for further cardiac monitoring - Stop Tikosyn. Transition to amiodarone.  Given COPD consider obtaining baseline PFTs w/ DLCO prior to initiation of therapy  - Keep K > 4.0 and Mg >2.0 - Update Echo to reassess LVEF  - No ACS given negative trop x 2 but consider cardiac cath if drop in EF   2. 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 - given WCT concerning for VT, will update echo to ensure no drop in EF. If EF is reduced, will need cath to r/o obstructive CAD - no signs of volume overload on exam. CXR w/ no edema. BNP 86  - Continue home regimen  Toprol XL 25 mg bid  Entresto 49-51 mg bid   Lasix 60 mg bid   3. Atrial Fibrillation/ Flutter - prior DCCVs and Afib ablation in 2018 - stop Tikosyn w/ WCT  - ? Afib w/ aberrancy. Post conversion EKGs show ? Afib/flutter  - Plan transition to amiodarone - Continue Eliquis 5 mg bid    4. CAD - coronary CTA in 2018 showed coronary calcium score in the  82nd percentile with nonobstructive coronary disease - Hs Trop 13>>12 - denies recent CP  - check echo. If EF reduced, will need cath to r/o obstructive CAD to explain arrhthymias - has not been on ASA due to Eliquis - continue  blocker and lipid lowering agents  5. T2DM -  Continue home regimen  Lantus 15 U daily   Dapagliflozin-Metformin  -  Continue statin   6. OSA - CPAP QHS   Length of Stay: 0  Lyda Jester, PA-C  09/07/2020, 12:45 PM  Advanced Heart Failure Team Pager [phone] (M-F; 7a - 4p)  Please contact [facility] for night-coverage after hours (4p -7a ) and weekends on [email or web address]  Patient seen with PA, agree with the above note.    For about a week, she has had transient spells of lightheadedness and presyncope.  She is not sure if she has ever completely passed out.  Today, she had a severe episode of presyncope and called EMS, noted to be in monomorphic VT. She was bolused with amiodarone and went back into NSR.    Of note, she is on Tikosyn for atrial fibrillation and atrial flutter.  No new meds recently and Tikosyn dose has not changed.  Last DCCV for atrial flutter was in 12/21.    Currently, she is back in atypical atrial flutter with variable block.  She has been started on amiodarone gtt.  Echo was done today (I reviewed), EF appears normal 60-65% with normal RV.  Troponin normal.  Lactate initially elevated at 2.2 but repeat was normal.  Electrolytes normal. SBP has been 90s-100s.   General: NAD Neck: No JVD, no thyromegaly or thyroid nodule.  Lungs: Clear to auscultation bilaterally with normal respiratory effort. CV: Nondisplaced PMI.  Heart irregular S1/S2, no S3/S4, no murmur. Trace ankle edema.  No carotid bruit.  Difficult to palpate pedal pulses.  Abdomen: Soft, nontender, no hepatosplenomegaly, no distention.  Skin: Intact without lesions or rashes.  Neurologic: Alert and oriented x 3.  Psych: Normal affect. Extremities: No clubbing or  cyanosis.  HEENT: Normal.   1. 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  This is not the typical Tikosyn-induced polymorphic VT.  No changes in Tikosyn dosing recently and no new meds.  Echo shows normal EF and  electrolytes normal.  - Think we will need to stop Tikosyn, will transition over to amiodarone per EP.  - As she did not have typical PMVT of Tikosyn, think we need to do an ischemic workup.  I will arrange for coronary angiography tomorrow to rule out significant CAD. Hold apixaban starting today for cath tomorrow.  Discussed risks/benefits with patient and she agrees to procedure.  - If clean cath, ?ICD or hold off as VT would most likely be due to Tikosyn => discuss with EP.  2. Atrial fibrillation/flutter: Paroxysmal. 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 She was admitted in 1/18 with atrial fibrillation/RVR and CHF. She was diuresed and had DCCV back to NSR. She had atrial fibrillation ablation in 4/18. In 5/19, she went into atrial fibrillation transiently. In 10/19, she was back in atrial fibrillation and was admitted for Tikosyn initiation and cardioversion. Atypical atrial flutter noted in 12/21 with DCCV to NSR. Now, back in atypical atrial flutter.  - Holding Eliquis this evening for cath then will restart.  -Switching from Tikosyn to amiodarone.  If she stays in flutter, should get cardioversion in the future.  3. Claudication: Left leg claudication with absent left PT pulse. 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  4. Chronic systolic =>diastolic CHF: EF 03-83% on echo in 1/18, down from 50-55% in 4/15. 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 Echo was done today showing EF 60-65%. NYHA class II symptoms, she is not volume overloaded by exam.  -Hold  tomorrow morning's Lasix dose for cath then restart.   - Continue Toprol XL 25 mg bid.  - Hold Entresto for now with soft BP.  - ?rash from spironolactone, now on eplerenone 25 mg daily.  5. CAD: Coronary CTA in 2018 showed coronary calcium score in the 82nd percentile with nonobstructive coronary disease.   - not on ASA 81 as she is taking Eliqius.  - Continue Vascepa and Livalo.  - See discussion above, will plan LHC to evaluate VT.  6. COPD:  History of smoking.  7. OSA: CPAP.   [name] 09/07/2020 5:18 PM

## 2020-09-07 NOTE — ED Notes (Signed)
Lactic Acid 2.2, this critical result  Reported to [facility]. , he was at bedside.

## 2020-09-07 NOTE — ED Notes (Signed)
Noted 12 beat run of V-tach on the monitor

## 2020-09-07 NOTE — ED Triage Notes (Signed)
Pt arrived via EMS from home. Pt c/o weakness and dizziness on EMS arrival with initial rhythm NSR rate in 90's. Pt went into [facility] HR 220, Amiodarone drip 150 mg administered over 10 minutes which converted rhythm back to NSR and arrived to ED in NSR.   20LWrist 100/78 BP 96 HR 18 RR 96% SpO2  293 CBG

## 2020-09-07 NOTE — ED Notes (Signed)
Spoke with Dana-APP with [facility] who consulted with Dr. Debara Pickett.  They advised to begin Amiodarone at 2015 as planned.

## 2020-09-07 NOTE — ED Notes (Signed)
Pt had another looooong run of vtach.  No chest pain.  States she feels woozy headed like she would imagine feeling drunk would feel

## 2020-09-07 NOTE — ED Provider Notes (Signed)
[facility] EMERGENCY DEPARTMENT Provider Note   CSN: [id number] Arrival date & time: 09/07/20  2423     History Chief Complaint  Patient presents with  . Tachycardia    Heidi Stark is a 80 y.o. female.  HPI Patient reports for a week now she has been feeling periodically lightheaded and somewhat faint.  She reports a wave just comes over her and it feels like it goes to her head and her body.  It has been lasting for a few seconds at a time and resolving.  During these episodes, patient denies she is experiencing any chest pain or perceiving palpitations or fluttering in her chest.  Patient reports that even when she has atrial fibrillation, she has never aware of fluttering or racing sensations in her chest.  She denies that she has had any fevers this week.  She does not feel that she has had chills or generalized body aches.  Patient denies she has had vomiting or diarrhea.  She reports she has been eating per usual.  She reports that due to the her unrepaired Achilles rupture and general medical conditions, she is fairly inactive.  She reports typically she stays home a lot.  She reports she can do some basic household tasks and has not had increased limitations by her symptoms this week.  She reports this morning, things were different.  She reports that the symptoms were persisting so intensely that she felt very dizzy and weak.  She reports ever since she woke up she was not getting much relief and called EMS.  Still, she denies any chest pain or pressure along with these symptoms.  Unvaccinated for Covid.  Denies typical symptoms on review of systems.  Patient reports she did take her morning Eliquis and Tikosyn.  EMS transported the patient.  During their transport, patient had an episode of rapid, wide-complex tachycardia.  Patient was given a 150 bolus of amiodarone over 10 minutes.  Blood pressures remained stable.  The tachycardia resolved.  See attached  images for EMS tracings.         Past Medical History:  Diagnosis Date  . A-fib ([place])   . Arthritis    "hands, wrists" (11/07/2016)  . CHF (congestive heart failure) ([place])   . Chronic lower back pain   . Constipation   . COPD (chronic obstructive pulmonary disease) ([place])   . DVT (deep venous thrombosis) ([place])   . Dyspnea   . History of blood transfusion 1990   "related to OR"  . History of kidney stones   . Hyperlipidemia   . Hypertension   . Joint pain   . Lower extremity edema   . OSA on CPAP   . Persistent atrial fibrillation ([place])   . Pneumonia    "several times" (11/07/2016)  . Type II diabetes mellitus [facility])     Patient Active Problem List   Diagnosis Date Noted  . Wide-complex tachycardia ([place]) 09/07/2020  . Persistent atrial fibrillation ([place]) 04/30/2018  . Wound of left leg 10/01/2017  . Cellulitis of right lower extremity 11/27/2016  . Encounter for assessment for deep vein thrombosis (DVT) 11/14/2016  . Hypokalemia 08/13/2016  . Constipation 08/13/2016  . Acute respiratory failure with hypoxia ([place]) 08/10/2016  . Atrial fibrillation with rapid ventricular response ([place]) 08/10/2016  . Type 2 diabetes mellitus with hyperglycemia, with long-term current use of insulin ([place])   . Atherosclerosis of aorta ([place]) 06/26/2016  . Tobacco abuse 06/26/2016  . Colitis 11/24/2014  .  Severe obesity (BMI >= 40) ([place]) 11/12/2013  . PAF (paroxysmal atrial fibrillation) ([place]) 11/03/2013  . 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 COLONIC POLYPS 08/16/2010  . PAD (peripheral artery disease) ([place]) 08/15/2010  . HLD (hyperlipidemia) 11/18/2009  . MYALGIA 11/18/2009  . OBESITY 05/14/2007  . PULMONARY NODULE, RIGHT MIDDLE LOBE 05/14/2007  . Type 2 diabetes mellitus with vascular disease ([place]) 03/20/2007  . Obstructive sleep apnea 12/06/2006  . Essential hypertension 12/06/2006  . COPD (chronic obstructive pulmonary disease) ([place])  12/06/2006    Past Surgical History:  Procedure Laterality Date  . ATRIAL FIBRILLATION ABLATION N/A 11/07/2016   Procedure: Atrial Fibrillation Ablation;  Surgeon: [name], MD;  Location: [place] CV LAB;  Service: Cardiovascular;  Laterality: N/A;  . BACK SURGERY    . CARDIAC CATHETERIZATION    . CARDIOVERSION N/A 08/11/2016   Procedure: CARDIOVERSION;  Surgeon: Larey Dresser, MD;  Location: [facility] ENDOSCOPY;  Service: Cardiovascular;  Laterality: N/A;  . CARDIOVERSION N/A 12/03/2017   Procedure: CARDIOVERSION;  Surgeon: Larey Dresser, MD;  Location: [facility] ENDOSCOPY;  Service: Cardiovascular;  Laterality: N/A;  . CARDIOVERSION N/A 05/02/2018   Procedure: CARDIOVERSION;  Surgeon: Pixie Casino, MD;  Location: [facility] ENDOSCOPY;  Service: Cardiovascular;  Laterality: N/A;  . CARDIOVERSION N/A 06/23/2020   Procedure: CARDIOVERSION;  Surgeon: [name], MD;  Location: [facility] ENDOSCOPY;  Service: Cardiovascular;  Laterality: N/A;  . CARPAL TUNNEL RELEASE    . CATARACT EXTRACTION W/ INTRAOCULAR LENS  IMPLANT, BILATERAL Bilateral   . COLON SURGERY  1990   vein graft and colon repair after nicked artery with back surgert  . COLONOSCOPY    . CYSTECTOMY     between bladder and kidneys  . GANGLION CYST EXCISION Left   . KNEE CARTILAGE SURGERY Right 1960s  . [place] SURGERY  1990  . REPAIR ILIAC ARTERY  1990   vein graft and colon repair after nicked artery with back surgert  . TEE WITHOUT CARDIOVERSION N/A 10/31/2013   Procedure: TRANSESOPHAGEAL ECHOCARDIOGRAM (TEE);  Surgeon: Larey Dresser, MD;  Location: [facility] ENDOSCOPY;  Service: Cardiovascular;  Laterality: N/A;  . TUBAL LIGATION       OB History    Gravida  2   Para      Term      Preterm      AB      Living        SAB      IAB      Ectopic      Multiple      Live Births              Family History  Problem Relation Age of Onset  . Diabetes Mother   . Hypertension Mother   . Cancer Mother    . Obesity Mother   . Diabetes Father   . Obesity Father   . Diabetes Other   . Melanoma Other   . Factor V Leiden deficiency Daughter     Social History   Tobacco Use  . Smoking status: Former Smoker    Packs/day: 1.00    Years: 50.00    Pack years: 50.00    Types: Cigarettes  . Smokeless tobacco: Never Used  Vaping Use  . Vaping Use: Never used  Substance Use Topics  . Alcohol use: No    Alcohol/week: 0.0 standard drinks  . Drug use: No    Home Medications Prior to Admission  medications   Medication Sig Start Date End Date Taking? Authorizing Provider  acetaminophen (TYLENOL) 500 MG tablet Take 500 mg by mouth every 6 (six) hours as needed for moderate pain or headache.    [provider]  Dapagliflozin-metFORMIN HCl ER (XIGDUO XR) 11-998 MG TB24 Take 1 tablet by mouth in the morning and at bedtime.    [provider]  dofetilide (TIKOSYN) 125 MCG capsule TAKE 1 CAPSULE BY MOUTH TWICE A DAY Patient taking differently: Take 125 mcg by mouth 2 (two) times daily. 04/07/20   Larey Dresser, MD  ELIQUIS 5 MG TABS tablet TAKE 1 TABLET BY MOUTH TWICE A DAY Patient taking differently: Take 5 mg by mouth 2 (two) times daily. 02/13/20   Larey Dresser, MD  ENTRESTO 49-51 MG TAKE 1 TABLET BY MOUTH TWICE A DAY Patient taking differently: Take 1 tablet by mouth 2 (two) times daily. 12/23/19   Larey Dresser, MD  eplerenone (INSPRA) 25 MG tablet TAKE 1 TABLET BY MOUTH EVERY DAY Patient taking differently: Take 25 mg by mouth daily. 03/08/20   Larey Dresser, MD  fenofibrate (TRICOR) 145 MG tablet TAKE 1 TABLET BY MOUTH EVERY DAY Patient taking differently: Take 145 mg by mouth daily. 02/13/20   Larey Dresser, MD  furosemide (LASIX) 40 MG tablet Take 1.5 tablets (60 mg total) by mouth 2 (two) times daily. 01/29/20   Larey Dresser, MD  insulin glargine (LANTUS) 100 UNIT/ML injection Inject 15 Units into the skin at bedtime.     [provider]  Insulin  Pen Needle (BD PEN NEEDLE NANO U/F) 32G X 4 MM MISC See admin instructions.    [provider]  LIVALO 4 MG TABS TAKE 1 TABLET BY MOUTH EVERY DAY Patient taking differently: Take 4 mg by mouth daily. 06/14/20   Camnitz, Ocie Doyne, MD  metoprolol succinate (TOPROL-XL) 25 MG 24 hr tablet TAKE 1 TABLET BY MOUTH TWICE A DAY Patient taking differently: Take 25 mg by mouth 2 (two) times daily. 02/02/20   Larey Dresser, MD  Multiple Vitamin (MULTIVITAMIN WITH MINERALS) TABS tablet Take 2 tablets by mouth daily. immuneti otc multivitamin    [provider]  potassium chloride (KLOR-CON M10) 10 MEQ tablet Take 2 tablets (20 mEq total) by mouth 2 (two) times daily. 04/07/20   Larey Dresser, MD  rosuvastatin (CRESTOR) 10 MG tablet Take 1 tablet by mouth daily.    [provider]  tobramycin-dexamethasone [facility]) ophthalmic solution Place 1 drop into both eyes daily as needed (redness).  05/13/20   [provider]  VASCEPA 1 g capsule TAKE 2 CAPSULES BY MOUTH TWICE A DAY 08/06/20   Larey Dresser, MD    Allergies    Neomycin-bacitracin zn-polymyx, Sulfonamide derivatives, Ciprofloxacin, and Spironolactone  Review of Systems   Review of Systems 10 systems reviewed and negative except as per HPI Physical Exam Updated Vital Signs BP (!) 91/56 (BP Location: Right Arm)   Pulse 88   Temp 98.5 F (36.9 C) (Oral)   Resp 16   Ht 5\' 6"  (1.676 m)   Wt 113.4 kg   SpO2 98%   BMI 40.35 kg/m   Physical Exam Constitutional:      Comments: Patient appears slightly fatigued.  She is alert and nontoxic.  No respiratory distress at rest.  Oxygen saturation off O2 on the monitor is showing 90 to 92%.  Good waveform.  HENT:     Head: Normocephalic and atraumatic.  Mouth/Throat:     Pharynx: Oropharynx is clear.  Eyes:     Extraocular Movements: Extraocular movements intact.  Cardiovascular:     Rate and Rhythm: Normal rate and regular rhythm.     Comments:  Heart regular.  No gross rub murmur gallop. Pulmonary:     Comments: No respiratory distress.  Faint crackle left base.  Otherwise clear. Abdominal:     General: There is no distension.     Palpations: Abdomen is soft.     Tenderness: There is no abdominal tenderness. There is no guarding.  Musculoskeletal:     Comments: No peripheral edema.  Calves nontender.  Feet warm and dry.  Skin:    General: Skin is warm and dry.  Neurological:     General: No focal deficit present.     Mental Status: She is oriented to person, place, and time.     Coordination: Coordination normal.  Psychiatric:        Mood and Affect: Mood normal.     ED Results / Procedures / Treatments   Labs (all labs ordered are listed, but only abnormal results are displayed) Labs Reviewed  COMPREHENSIVE METABOLIC PANEL - Abnormal; Notable for the following components:      Result Value   Chloride 97 (*)    Glucose, Bld 232 (*)    BUN 27 (*)    Creatinine, Ser 1.13 (*)    GFR, Estimated 50 (*)    All other components within normal limits  LACTIC ACID, PLASMA - Abnormal; Notable for the following components:   Lactic Acid, Venous 2.2 (*)    All other components within normal limits  CBC WITH DIFFERENTIAL/PLATELET - Abnormal; Notable for the following components:   RBC 5.36 (*)    Hemoglobin 16.7 (*)    HCT 50.3 (*)    RDW 15.6 (*)    All other components within normal limits  I-STAT CHEM 8, ED - Abnormal; Notable for the following components:   BUN 34 (*)    Glucose, Bld 229 (*)    Calcium, Ion 1.02 (*)    Hemoglobin 17.7 (*)    HCT 52.0 (*)    All other components within normal limits  RESP PANEL BY RT-PCR (FLU A&B, COVID) ARPGX2  BRAIN NATRIURETIC PEPTIDE  PROTIME-INR  MAGNESIUM  PHOSPHORUS  TSH  LACTIC ACID, PLASMA  URINALYSIS, ROUTINE W REFLEX MICROSCOPIC  TROPONIN I (HIGH SENSITIVITY)  TROPONIN I (HIGH SENSITIVITY)    EKG EKG Interpretation  Date/Time:  Tuesday September 07 2020 09:38:19  EST Ventricular Rate:  91 PR Interval:    QRS Duration: 129 QT Interval:  443 QTC Calculation: 546 R Axis:   -41 Text Interpretation: Sinus rhythm Nonspecific IVCD with LAD Nonspecific T abnormalities, lateral leads subtle inf lat T wave coving c/w previous Confirmed by Charlesetta Shanks [id number]) on 09/07/2020 9:44:45 AM   Radiology DG Chest Port 1 View  Result Date: 09/07/2020 CLINICAL DATA:  Near syncope. EXAM: PORTABLE CHEST 1 VIEW COMPARISON:  08/09/2016. FINDINGS: Cardiomegaly. Low lung volumes. No focal infiltrate. No pleural effusion or pneumothorax. IMPRESSION: Cardiomegaly. No acute pulmonary disease. Electronically Signed   By: Marcello Moores  Register   On: 09/07/2020 10:19    Procedures Procedures  CRITICAL CARE Performed by: Charlesetta Shanks   Total critical care time: 33 minutes  Critical care time was exclusive of separately billable procedures and treating other patients.  Critical care was necessary to treat or prevent imminent or life-threatening deterioration.  Critical care was time spent  personally by me on the following activities: development of treatment plan with patient and/or surrogate as well as nursing, discussions with consultants, evaluation of patient's response to treatment, examination of patient, obtaining history from patient or surrogate, ordering and performing treatments and interventions, ordering and review of laboratory studies, ordering and review of radiographic studies, pulse oximetry and re-evaluation of patient's condition. Medications Ordered in ED Medications  0.9 %  sodium chloride infusion ( Intravenous New Bag/Given 09/07/20 1153)    ED Course  I have reviewed the triage vital signs and the nursing notes.  Pertinent labs & imaging results that were available during my care of the patient were reviewed by me and considered in my medical decision making (see chart for details).    MDM Rules/Calculators/A&P                         Consult:  Consult placed to cardiology.  Reviewed with card master Trish shortly after patient arrival.  Patient presents as outlined.  All week she is getting symptoms of lightheadedness and feeling slightly syncopal.  Today, during EMS transport, rapid, wide-complex tachycardia captured.  Patient does have known history of paroxysmal atrial fibrillation. Wide-complex rhythm resolved with administration of amiodarone by EMS. Amiodarone drip initiated after arrival to the emergency department. No electrolyte derangements as likely etiology for wide-complex tachycardia. Patient does not have active chest pain or troponin elevation to suggest ACS at this time. Case reviewed with cardiology for admission and ongoing management. Patient has remained pain-free with rate controlled narrow complex rhythm in the emergency department on amiodarone drip. Final Clinical Impression(s) / ED Diagnoses Final diagnoses:  Wide-complex tachycardia ([place])  Severe comorbid illness    Rx / DC Orders ED Discharge Orders    None       Charlesetta Shanks, MD 09/07/20 1233

## 2020-09-07 NOTE — ED Notes (Signed)
Meal tray at bedside.  

## 2020-09-07 NOTE — ED Notes (Signed)
Pt placed on Zoll Pads

## 2020-09-07 NOTE — H&P (Addendum)
ELECTROPHYSIOLOGY H&P NOTE    Patient ID: Heidi Stark MRN: [id number], DOB/AGE: 1941-07-12 80 y.o.  Admit date: 09/07/2020 Date of Consult: 09/07/2020  Primary Physician: [name], [name], DO Primary Cardiologist: No primary care provider on file.  Electrophysiologist: Dr. [name]  Reason for admission: Wide Complex Tachycardia / Near syncope  Patient Profile: Heidi Stark is a 80 y.o. female with a history of Afib and flutter, Chronic anticoagulation on Eliquis 5 mg BID, CHA2DS2VASC of at least 9, chronic systolic CHF, NICM (by cardiac CT), Calcium score 82 percentile but no invasive ischemic work up, COPD, and OSA who presented to [facility] via EMS with periods of lightheadedness and near syncope x 1 week.   HPI:  Heidi Stark is a 80 y.o. female with medical history as above.   She reports prior to the past week she was in her USOH. She has mild SOB with moderate exertion at baseline.   She first called 2/20 with complaints of near syncope and was recommended to follow up in office ASAP or to seek emergency care if it worsened. She did not feel any better this am so called 911.   She was CAO x 4 on EMS arrival and reported sudden onset episodes of lightheadedness and near syncope. While on 12 lead patient went into a wide complex tachycardia as fast as 220 bpm. Valsalva maneuvers attempted without success. She spontaneously converted to NSR after "a couple minutes". She had recurrent WCT and was bolused with and started on amiodarone gtt. Remained in atrial flutter with controlled rates for the remainder of EMS care.   Pertinent labs on admission include COVID negative, TSH pending, Cr  1.13, K 4.1, Mg 2.3, WBC 8.1, Hgb 16.7.  Patient currently feels Ok. Primary complaints are fatigue x 1 week and intermittent episodes of near syncope and lightheadedness as above. She has not had ventricular tachycardia in the past.   Echo 12/2017 LVEF had improved to 55%. She reports  medication compliance and last took Eliquis, Entresto, and Tikosyn this am.   Past Medical History:  Diagnosis Date  . A-fib ([place])   . Arthritis    "hands, wrists" (11/07/2016)  . CHF (congestive heart failure) ([place])   . Chronic lower back pain   . Constipation   . COPD (chronic obstructive pulmonary disease) ([place])   . DVT (deep venous thrombosis) ([place])   . Dyspnea   . History of blood transfusion 1990   "related to OR"  . History of kidney stones   . Hyperlipidemia   . Hypertension   . Joint pain   . Lower extremity edema   . OSA on CPAP   . Persistent atrial fibrillation ([place])   . Pneumonia    "several times" (11/07/2016)  . Type II diabetes mellitus ([place])      Surgical History:  Past Surgical History:  Procedure Laterality Date  . ATRIAL FIBRILLATION ABLATION N/A 11/07/2016   Procedure: Atrial Fibrillation Ablation;  Surgeon: [name], MD;  Location: [place] [facility] LAB;  Service: Cardiovascular;  Laterality: N/A;  . BACK SURGERY    . CARDIAC CATHETERIZATION    . CARDIOVERSION N/A 08/11/2016   Procedure: CARDIOVERSION;  Surgeon: Larey Dresser, MD;  Location: [facility] ENDOSCOPY;  Service: Cardiovascular;  Laterality: N/A;  . CARDIOVERSION N/A 12/03/2017   Procedure: CARDIOVERSION;  Surgeon: Larey Dresser, MD;  Location: [facility] ENDOSCOPY;  Service: Cardiovascular;  Laterality: N/A;  . CARDIOVERSION N/A 05/02/2018   Procedure: CARDIOVERSION;  Surgeon: Pixie Casino, MD;  Location: [facility] ENDOSCOPY;  Service: Cardiovascular;  Laterality: N/A;  . CARDIOVERSION N/A 06/23/2020   Procedure: CARDIOVERSION;  Surgeon: [name], MD;  Location: [facility] ENDOSCOPY;  Service: Cardiovascular;  Laterality: N/A;  . CARPAL TUNNEL RELEASE    . CATARACT EXTRACTION W/ INTRAOCULAR LENS  IMPLANT, BILATERAL Bilateral   . COLON SURGERY  1990   vein graft and colon repair after nicked artery with back surgert  . COLONOSCOPY    . CYSTECTOMY     between bladder and kidneys  . GANGLION CYST  EXCISION Left   . KNEE CARTILAGE SURGERY Right 1960s  . [place] SURGERY  1990  . REPAIR ILIAC ARTERY  1990   vein graft and colon repair after nicked artery with back surgert  . TEE WITHOUT CARDIOVERSION N/A 10/31/2013   Procedure: TRANSESOPHAGEAL ECHOCARDIOGRAM (TEE);  Surgeon: Larey Dresser, MD;  Location: [facility] ENDOSCOPY;  Service: Cardiovascular;  Laterality: N/A;  . TUBAL LIGATION       (Not in a hospital admission)   Inpatient Medications:   Allergies:  Allergies  Allergen Reactions  . Neomycin-Bacitracin Zn-Polymyx Rash  . Sulfonamide Derivatives Other (See Comments)    Stomach cramps  . Ciprofloxacin Hives, Itching and Rash  . Spironolactone Rash    Social History   Socioeconomic History  . Marital status: Married    Spouse name: [name]  . Number of children: 2  . Years of education: Not on file  . Highest education level: Not on file  Occupational History  . Occupation: Retired  Tobacco Use  . Smoking status: Former Smoker    Packs/day: 1.00    Years: 50.00    Pack years: 50.00    Types: Cigarettes  . Smokeless tobacco: Never Used  Vaping Use  . Vaping Use: Never used  Substance and Sexual Activity  . Alcohol use: No    Alcohol/week: 0.0 standard drinks  . Drug use: No  . Sexual activity: Not on file  Other Topics Concern  . Not on file  Social History Narrative  . Not on file   Social Determinants of Health   Financial Resource Strain: Not on file  Food Insecurity: Not on file  Transportation Needs: Not on file  Physical Activity: Not on file  Stress: Not on file  Social Connections: Not on file  Intimate Partner Violence: Not on file     Family History  Problem Relation Age of Onset  . Diabetes Mother   . Hypertension Mother   . Cancer Mother   . Obesity Mother   . Diabetes Father   . Obesity Father   . Diabetes Other   . Melanoma Other   . Factor V Leiden deficiency Daughter      Review of Systems: All other systems  reviewed and are otherwise negative except as noted above.  Physical Exam: Vitals:   09/07/20 1030 09/07/20 1045 09/07/20 1100 09/07/20 1123  BP: 92/63 (!) 94/58 95/61 (!) 91/56  Pulse: 90 85 93 88  Resp: 15 18 18 16   Temp:      TempSrc:      SpO2: 97% 95% 97% 98%  Weight:      Height:        GEN- The patient is well appearing, alert and oriented x 3 today.   HEENT: normocephalic, atraumatic; sclera clear, conjunctiva pink; hearing intact; oropharynx clear; neck supple Lungs- Clear to ausculation bilaterally, normal work of breathing.  No wheezes, rales, rhonchi Heart-  Regular rate and rhythm, no murmurs, rubs or gallops GI- soft, non-tender, non-distended, bowel sounds present Extremities- no clubbing, cyanosis, or edema; DP/PT/radial pulses 2+ bilaterally MS- no significant deformity or atrophy Skin- warm and dry, no rash or lesion Psych- euthymic mood, full affect Neuro- strength and sensation are intact  Labs:   Lab Results  Component Value Date   WBC 8.1 09/07/2020   HGB 17.7 (H) 09/07/2020   HCT 52.0 (H) 09/07/2020   MCV 93.8 09/07/2020   PLT 317 09/07/2020    Recent Labs  Lab 09/07/20 1004 09/07/20 1015  NA 137 138  K 4.1 3.9  CL 97* 100  CO2 25  --   BUN 27* 34*  CREATININE 1.13* 1.00  CALCIUM 9.0  --   PROT 7.1  --   BILITOT 0.7  --   ALKPHOS 40  --   ALT 22  --   AST 32  --   GLUCOSE 232* 229*      Radiology/Studies: DG Chest Port 1 View  Result Date: 09/07/2020 CLINICAL DATA:  Near syncope. EXAM: PORTABLE CHEST 1 VIEW COMPARISON:  08/09/2016. FINDINGS: Cardiomegaly. Low lung volumes. No focal infiltrate. No pleural effusion or pneumothorax. IMPRESSION: Cardiomegaly. No acute pulmonary disease. Electronically Signed   By: Marcello Moores  Register   On: 09/07/2020 10:19    EKG: on arrival shows atrial flutter with ventricular rate of 91 bpm, QTc ~505 ms when measured manually (personally reviewed)  TELEMETRY: shows atrial flutter in 70-90s (personally  reviewed)  Assessment/Plan: 1.  WCT EKG strips concerning for ventricular tachycardia, suspect in setting of tikosyn use but has been stable on low dose for quite some time.  Last dose of tikosyn this am. Discussed with Dr. [name] (primary EP) and will most likely require transition to amiodarone. Discussed risks and benefits of amiodarone therapy.  Work up thus far unrevealing as any potential cause for her WCT other than tikosyn.  Update Echo.  HF team aware of her admission per cardsmaster and to see as well.   2. Persistent AF ( s/p AF ablation 2018) / Atypical flutter Overall had been doing well s/p AF ablation 2018 on tikosyn.  Had Flutter in December and underwent [facility].  She is now back in flutter, suspect in setting of acute episodes over the past week.  Will need close outpatient follow up on amio for surveillance and consideration of NSR/DCCV  Not likely candidate for ablation.  3. Chronic systolic CHF with previously normalized EF EF previously normalized by Echo 12/2017. (30-35% in 07/2016) Continue GDMT as BP tolerates.   4. COPD Has continued to smoke.  Encouraged cessation.  Monitor closely on amio.   5. CAD Coronary CTA showed "non-obstructive" CAD with Ca score in 82nd percentile.  May need repeat ischemic work up if EF has fallen.   Will need monitoring for 24-48 hours with likely VT in setting of tikosyn use.  Keep K > 4.0 and Mg > 2.0   ADDENDUM Dr. Rayann Heman has seen the patient.   While her VT was not the typical PMVT associated with Tikosyn, still agree that stopping Tikosyn, at least for now, and continuing amiodarone is best option for her.   She will likely need ischemic work up. HF team to see as well.   For questions or updates, please contact [place] Please consult [email or web address] for contact info under Cardiology/STEMI.  Signed, Shirley Friar, PA-C  09/07/2020 11:55 AM   I have seen, examined the patient, and reviewed the  above  assessment and plan.  Changes to above are made where necessary.  On exam, RRR.   The patient presents with symptomatic wide complex tachycardia.  On my review, this is a RBB superior axis tachycardia.   This appears to be VT arising from the inferoapical LV.   I would advise admission for further evaluation. Though this arrhythmia is not secondary to tikosyn, I would advise that we stop tikosyn and start Amiodarone which would be more effective at treating this VT.  Cath and echo are pending.  If these are unrevealing, I would advise cardiac MRI.  She was not hemodynamically unstable with her arrhythmia.  If EF > 40%, perhaps we can treat her medically and avoid ICD implantation given her advanced age and comorbidities.  Dr [name] to follow.  Co Sign: Thompson Grayer, MD 09/07/2020 9:46 PM

## 2020-09-07 NOTE — ED Notes (Signed)
Oda Kilts, PA-C was at bedisde and asked to stop amiodarone until he (and the doctor) can figure out if patient will use Tikosyn. He also said to re-start amiodarone if heart rate > 150 or if it has wide complex QRS>.

## 2020-09-07 NOTE — ED Notes (Signed)
Dr. Vickki Muff notified regarding the runs of [place].  Advised to continue course of action with Amiodarone at this time.

## 2020-09-07 NOTE — Telephone Encounter (Signed)
Patient stated she is having side effects from the medication that DM started her on, difficult breathing, and request a much sooner appt, than available, pt can be reached [phone]. Please advise

## 2020-09-07 NOTE — Telephone Encounter (Signed)
Pt in ED.  

## 2020-09-07 NOTE — ED Notes (Signed)
Pt had another significant run of Vtach.

## 2020-09-08 ENCOUNTER — Encounter (HOSPITAL_COMMUNITY)
Admission: EM | Disposition: A | Discharge status: Home / Self Care | Payer: Self-pay | Source: Home / Self Care | Attending: Cardiology

## 2020-09-08 ENCOUNTER — Encounter (HOSPITAL_COMMUNITY): Payer: Self-pay | Admitting: Cardiology

## 2020-09-08 DIAGNOSIS — I428 Other cardiomyopathies: Secondary | ICD-10-CM | POA: Diagnosis not present

## 2020-09-08 DIAGNOSIS — Z6841 Body Mass Index (BMI) 40.0 and over, adult: Secondary | ICD-10-CM | POA: Diagnosis not present

## 2020-09-08 DIAGNOSIS — I2510 Atherosclerotic heart disease of native coronary artery without angina pectoris: Secondary | ICD-10-CM | POA: Diagnosis not present

## 2020-09-08 DIAGNOSIS — I472 Ventricular tachycardia: Secondary | ICD-10-CM

## 2020-09-08 DIAGNOSIS — I5042 Chronic combined systolic (congestive) and diastolic (congestive) heart failure: Secondary | ICD-10-CM | POA: Diagnosis not present

## 2020-09-08 HISTORY — PX: CORONARY ANGIOGRAPHY: CATH118303

## 2020-09-08 LAB — GLUCOSE, CAPILLARY
Glucose-Capillary: 141 mg/dL — ABNORMAL HIGH (ref 70–99)
Glucose-Capillary: 141 mg/dL — ABNORMAL HIGH (ref 70–99)

## 2020-09-08 LAB — BASIC METABOLIC PANEL
Anion gap: 13 (ref 5–15)
Anion gap: 12 (ref 5–15)
BUN: 20 mg/dL (ref 8–23)
BUN: 16 mg/dL (ref 8–23)
CO2: 23 mmol/L (ref 22–32)
CO2: 25 mmol/L (ref 22–32)
Calcium: 8.3 mg/dL — ABNORMAL LOW (ref 8.9–10.3)
Calcium: 8 mg/dL — ABNORMAL LOW (ref 8.9–10.3)
Chloride: 101 mmol/L (ref 98–111)
Chloride: 99 mmol/L (ref 98–111)
Creatinine, Ser: 0.92 mg/dL (ref 0.44–1.00)
Creatinine, Ser: 0.79 mg/dL (ref 0.44–1.00)
GFR, Estimated: >60 mL/min (ref >60)
GFR, Estimated: >60 mL/min (ref >60)
Glucose, Bld: 177 mg/dL — ABNORMAL HIGH (ref 70–99)
Glucose, Bld: 168 mg/dL — ABNORMAL HIGH (ref 70–99)
Potassium: 3.9 mmol/L (ref 3.5–5.1)
Potassium: 3.4 mmol/L — ABNORMAL LOW (ref 3.5–5.1)
Sodium: 137 mmol/L (ref 135–145)
Sodium: 136 mmol/L (ref 135–145)

## 2020-09-08 LAB — MAGNESIUM: Magnesium: 2.1 mg/dL (ref 1.7–2.4)

## 2020-09-08 LAB — CBG MONITORING, ED: Glucose-Capillary: 164 mg/dL — ABNORMAL HIGH (ref 70–99)

## 2020-09-08 SURGERY — CORONARY ANGIOGRAPHY (CATH LAB)
Anesthesia: LOCAL

## 2020-09-08 MED ORDER — SODIUM CHLORIDE 0.9 % IV SOLN: 250.0000 mL | INTRAVENOUS | Status: DC | PRN

## 2020-09-08 MED ORDER — ONDANSETRON HCL 4 MG/2ML IJ SOLN: 4.0000 mg | Freq: Four times a day (QID) | INTRAMUSCULAR | Status: DC | PRN

## 2020-09-08 MED ORDER — AMIODARONE IV BOLUS ONLY 150 MG/100ML
150.0000 mg | INTRAVENOUS | Status: AC
  Administered 2020-09-08: 150 mg via INTRAVENOUS

## 2020-09-08 MED ORDER — FENTANYL CITRATE (PF) 100 MCG/2ML IJ SOLN
INTRAMUSCULAR | Status: DC | PRN
  Administered 2020-09-08: 25 ug via INTRAVENOUS

## 2020-09-08 MED ORDER — VERAPAMIL HCL 2.5 MG/ML IV SOLN
INTRAVENOUS | Status: DC | PRN
  Administered 2020-09-08: 10 mL via INTRA_ARTERIAL

## 2020-09-08 MED ORDER — FENTANYL CITRATE (PF) 100 MCG/2ML IJ SOLN
INTRAMUSCULAR | Status: AC
  Filled 2020-09-08: qty 2

## 2020-09-08 MED ORDER — LABETALOL HCL 5 MG/ML IV SOLN: 10.0000 mg | INTRAVENOUS | Status: AC | PRN

## 2020-09-08 MED ORDER — SODIUM CHLORIDE 0.9 % IV SOLN: INTRAVENOUS | Status: DC

## 2020-09-08 MED ORDER — HEPARIN (PORCINE) IN NACL 1000-0.9 UT/500ML-% IV SOLN
INTRAVENOUS | Status: AC
  Filled 2020-09-08: qty 1000

## 2020-09-08 MED ORDER — HEPARIN SODIUM (PORCINE) 1000 UNIT/ML IJ SOLN
INTRAMUSCULAR | Status: AC
  Filled 2020-09-08: qty 1

## 2020-09-08 MED ORDER — ASPIRIN 81 MG PO CHEW: 81.0000 mg | CHEWABLE_TABLET | ORAL | Status: DC

## 2020-09-08 MED ORDER — PROPRANOLOL HCL 10 MG PO TABS
10.0000 mg | ORAL_TABLET | Freq: Three times a day (TID) | ORAL | Status: DC
Start: 2020-09-08 — End: 2020-09-09
  Administered 2020-09-08 – 2020-09-09 (×2): 10 mg via ORAL
  Filled 2020-09-08 (×4): qty 1

## 2020-09-08 MED ORDER — MIDAZOLAM HCL 2 MG/2ML IJ SOLN
INTRAMUSCULAR | Status: AC
  Filled 2020-09-08: qty 2

## 2020-09-08 MED ORDER — HEPARIN SODIUM (PORCINE) 1000 UNIT/ML IJ SOLN
INTRAMUSCULAR | Status: DC | PRN
  Administered 2020-09-08: 6000 [IU] via INTRAVENOUS

## 2020-09-08 MED ORDER — IOHEXOL 350 MG/ML SOLN
INTRAVENOUS | Status: DC | PRN
  Administered 2020-09-08: 55 mL

## 2020-09-08 MED ORDER — SODIUM CHLORIDE 0.9% FLUSH
3.0000 mL | Freq: Two times a day (BID) | INTRAVENOUS | Status: DC
  Administered 2020-09-09 – 2020-09-11 (×2): 3 mL via INTRAVENOUS

## 2020-09-08 MED ORDER — LIDOCAINE HCL (PF) 1 % IJ SOLN
INTRAMUSCULAR | Status: AC
  Filled 2020-09-08: qty 30

## 2020-09-08 MED ORDER — ASPIRIN 81 MG PO CHEW
81.0000 mg | CHEWABLE_TABLET | ORAL | Status: AC
  Administered 2020-09-08: 81 mg via ORAL
  Filled 2020-09-08: qty 1

## 2020-09-08 MED ORDER — SODIUM CHLORIDE 0.9 % IV SOLN: INTRAVENOUS | Status: AC

## 2020-09-08 MED ORDER — SODIUM CHLORIDE 0.9% FLUSH: 3.0000 mL | INTRAVENOUS | Status: DC | PRN

## 2020-09-08 MED ORDER — ACETAMINOPHEN 325 MG PO TABS: 650.0000 mg | ORAL_TABLET | ORAL | Status: DC | PRN

## 2020-09-08 MED ORDER — MIDAZOLAM HCL 2 MG/2ML IJ SOLN
INTRAMUSCULAR | Status: DC | PRN
  Administered 2020-09-08: 1 mg via INTRAVENOUS

## 2020-09-08 MED ORDER — HEPARIN (PORCINE) IN NACL 1000-0.9 UT/500ML-% IV SOLN
INTRAVENOUS | Status: DC | PRN
  Administered 2020-09-08 (×2): 500 mL

## 2020-09-08 MED ORDER — HYDRALAZINE HCL 20 MG/ML IJ SOLN: 10.0000 mg | INTRAMUSCULAR | Status: AC | PRN

## 2020-09-08 MED ORDER — APIXABAN 5 MG PO TABS
5.0000 mg | ORAL_TABLET | Freq: Two times a day (BID) | ORAL | Status: DC
  Administered 2020-09-08 – 2020-09-11 (×6): 5 mg via ORAL
  Filled 2020-09-08 (×6): qty 1

## 2020-09-08 MED ORDER — LIDOCAINE HCL (PF) 1 % IJ SOLN
INTRAMUSCULAR | Status: DC | PRN
  Administered 2020-09-08: 2 mL

## 2020-09-08 MED ORDER — APIXABAN 5 MG PO TABS: 5.0000 mg | ORAL_TABLET | Freq: Two times a day (BID) | ORAL | Status: DC

## 2020-09-08 MED ORDER — VERAPAMIL HCL 2.5 MG/ML IV SOLN
INTRAVENOUS | Status: AC
  Filled 2020-09-08: qty 2

## 2020-09-08 SURGICAL SUPPLY — 16 items
CATH 5FR JL3.5 JR4 ANG PIG MP (CATHETERS) ×1 IMPLANT
CATH INFINITI 4FR JL3.5 (CATHETERS) ×1 IMPLANT
CATH INFINITI MULTIPACK ANG 4F (CATHETERS) ×1 IMPLANT
DEVICE RAD COMP TR BAND LRG (VASCULAR PRODUCTS) ×1 IMPLANT
GLIDESHEATH SLEND SS 6F .021 (SHEATH) ×1 IMPLANT
GUIDEWIRE ANGLED .035X150CM (WIRE) ×1 IMPLANT
GUIDEWIRE INQWIRE 1.5J.035X260 (WIRE) IMPLANT
INQWIRE 1.5J .035X260CM (WIRE) ×2
KIT HEART LEFT (KITS) ×2 IMPLANT
MAT PREVALON FULL STRYKER (MISCELLANEOUS) ×1 IMPLANT
PACK CARDIAC CATHETERIZATION (CUSTOM PROCEDURE TRAY) ×2 IMPLANT
SHEATH GLIDE SLENDER 4/5FR (SHEATH) ×1 IMPLANT
SHEATH PROBE COVER 6X72 (BAG) ×1 IMPLANT
TRANSDUCER W/STOPCOCK (MISCELLANEOUS) ×2 IMPLANT
WIRE HI TORQ VERSACORE-J 145CM (WIRE) ×1 IMPLANT
WIRE MICROINTRODUCER 60CM (WIRE) ×1 IMPLANT

## 2020-09-08 NOTE — Progress Notes (Signed)
RN was notified by telemetry that pt was in possible V-fib, entering the room pt exclaimed that when the episode occurred she felt as if she was going to pass out (although did not lose consciousness) and that the monitor showed her HR above 200 BPM. 12-lead EKG obtained, RRN consulted. Rose, MD made aware and determined that pt was having episodes of VT. New orders placed (See MAR). Will continue to monitor.   Elaina Hoops, RN

## 2020-09-08 NOTE — Progress Notes (Addendum)
Advanced Heart Failure Rounding Note  PCP-Cardiologist: No primary care provider on file.   Subjective:    Admitted with VT. Tikosyn stopped and amio drip started.   Overnight had NSVT/VT.   Denies SOB. No chest pain.    Objective:   Weight Range: 113.4 kg Body mass index is 40.35 kg/m.   Vital Signs:   Temp:  [98.5 F (36.9 C)] 98.5 F (36.9 C) (02/22 0937) Pulse Rate:  [75-213] 92 (02/23 0645) Resp:  [14-25] 20 (02/23 0645) BP: (83-136)/(42-83) 124/74 (02/23 0645) SpO2:  [92 %-99 %] 95 % (02/23 0645) Weight:  [113.4 kg] 113.4 kg (02/22 0935)    Weight change: Filed Weights   09/07/20 0935  Weight: 113.4 kg    Intake/Output:   Intake/Output Summary (Last 24 hours) at 09/08/2020 0816 Last data filed at 09/07/2020 1958 Gross per 24 hour  Intake 1250 ml  Output -  Net 1250 ml      Physical Exam    General:   No resp difficulty HEENT: Normal Neck: Supple. JVP . Carotids 2+ bilat; no bruits. No lymphadenopathy or thyromegaly appreciated. Cor: PMI nondisplaced. Regular rate & rhythm. No rubs, gallops or murmurs. Zoll Pads on Lungs: Clear Abdomen: Soft, nontender, nondistended. No hepatosplenomegaly. No bruits or masses. Good bowel sounds. Extremities: No cyanosis, clubbing, rash, edema Neuro: Alert & orientedx3, cranial nerves grossly intact. moves all 4 extremities w/o difficulty. Affect pleasant   Telemetry  SR with episodes NSVT/VT  EKG    n/a  Labs    CBC Recent Labs    09/07/20 1004 09/07/20 1015  WBC 8.1  --   NEUTROABS 5.2  --   HGB 16.7* 17.7*  HCT 50.3* 52.0*  MCV 93.8  --   PLT 317  --    Basic Metabolic Panel Recent Labs    09/07/20 1004 09/07/20 1015 09/08/20 0249  NA 137 138 137  K 4.1 3.9 3.9  CL 97* 100 101  CO2 25  --  23  GLUCOSE 232* 229* 177*  BUN 27* 34* 20  CREATININE 1.13* 1.00 0.92  CALCIUM 9.0  --  8.3*  MG 2.3  --   --   PHOS 4.0  --   --    Liver Function Tests Recent Labs    09/07/20 1004   AST 32  ALT 22  ALKPHOS 40  BILITOT 0.7  PROT 7.1  ALBUMIN 3.6   No results for input(s): LIPASE, AMYLASE in the last 72 hours. Cardiac Enzymes No results for input(s): CKTOTAL, CKMB, CKMBINDEX, TROPONINI in the last 72 hours.  BNP: BNP (last 3 results) Recent Labs    09/07/20 1004  BNP 85.9    ProBNP (last 3 results) No results for input(s): PROBNP in the last 8760 hours.   D-Dimer No results for input(s): DDIMER in the last 72 hours. Hemoglobin A1C No results for input(s): HGBA1C in the last 72 hours. Fasting Lipid Panel No results for input(s): CHOL, HDL, LDLCALC, TRIG, CHOLHDL, LDLDIRECT in the last 72 hours. Thyroid Function Tests Recent Labs    09/07/20 1004  TSH 3.251    Other results:   Imaging    DG Chest Port 1 View  Result Date: 09/07/2020 CLINICAL DATA:  Near syncope. EXAM: PORTABLE CHEST 1 VIEW COMPARISON:  08/09/2016. FINDINGS: Cardiomegaly. Low lung volumes. No focal infiltrate. No pleural effusion or pneumothorax. IMPRESSION: Cardiomegaly. No acute pulmonary disease. Electronically Signed   By: Marcello Moores  Register   On: 09/07/2020 10:19   ECHOCARDIOGRAM  COMPLETE  Result Date: 09/07/2020    ECHOCARDIOGRAM REPORT   Patient Name:   Heidi Stark Date of Exam: 09/07/2020 Medical Rec #:  [id number]      Height:       66.0 in Accession #:    [id number]     Weight:       250.0 lb Date of Birth:  1941-03-24      BSA:          2.199 m Patient Age:    80 years       BP:           104/60 mmHg Patient Gender: F              HR:           89 bpm. Exam Location:  Inpatient Procedure: 2D Echo, Cardiac Doppler and Color Doppler Indications:    I47.2 Ventricular tachycardia  History:        Patient has prior history of Echocardiogram examinations, most                 recent 09/07/2020. CHF, COPD, Arrythmias:Atrial Fibrillation,                 Signs/Symptoms:Dyspnea; Risk Factors:Hypertension, Diabetes,                 Dyslipidemia and Sleep Apnea.  Sonographer:     Tiffany Dance Referring Phys: [id number] [place]  1. Left ventricular ejection fraction, by estimation, is 50 to 55%. The left ventricle has low normal function. The left ventricle has no regional wall motion abnormalities. Left ventricular diastolic parameters are indeterminate.  2. Right ventricular systolic function is normal. The right ventricular size is normal. There is normal pulmonary artery systolic pressure.  3. Left atrial size was mild to moderately dilated.  4. Right atrial size was mildly dilated.  5. A small pericardial effusion is present.  6. The mitral valve is abnormal. Trivial mitral valve regurgitation. Moderate mitral annular calcification.  7. The aortic valve is grossly normal. Aortic valve regurgitation is not visualized. No aortic stenosis is present.  8. The inferior vena cava is normal in size with greater than 50% respiratory variability, suggesting right atrial pressure of 3 mmHg. Comparison(s): No significant change from prior study. FINDINGS  Left Ventricle: Left ventricular ejection fraction, by estimation, is 50 to 55%. The left ventricle has low normal function. The left ventricle has no regional wall motion abnormalities. The left ventricular internal cavity size was normal in size. There is borderline left ventricular hypertrophy. Left ventricular diastolic parameters are indeterminate. Right Ventricle: The right ventricular size is normal. Right vetricular wall thickness was not well visualized. Right ventricular systolic function is normal. There is normal pulmonary artery systolic pressure. The tricuspid regurgitant velocity is 2.43 m/s, and with an assumed right atrial pressure of 3 mmHg, the estimated right ventricular systolic pressure is 42.7 mmHg. Left Atrium: Left atrial size was mild to moderately dilated. Right Atrium: Right atrial size was mildly dilated. Pericardium: A small pericardial effusion is present. Presence of pericardial fat pad.  Mitral Valve: The mitral valve is abnormal. Moderate mitral annular calcification. Trivial mitral valve regurgitation. Tricuspid Valve: The tricuspid valve is normal in structure. Tricuspid valve regurgitation is trivial. Aortic Valve: The aortic valve is grossly normal. There is mild to moderate aortic valve annular calcification. Aortic valve regurgitation is not visualized. No aortic stenosis is present. Pulmonic Valve: The pulmonic valve was not well visualized.  Pulmonic valve regurgitation is not visualized. Aorta: The aortic root, ascending aorta and aortic arch are all structurally normal, with no evidence of dilitation or obstruction. Venous: The inferior vena cava is normal in size with greater than 50% respiratory variability, suggesting right atrial pressure of 3 mmHg. IAS/Shunts: The atrial septum is grossly normal.  LEFT VENTRICLE PLAX 2D LVIDd:         5.00 cm  Diastology LVIDs:         4.20 cm  LV e' medial:    7.40 cm/s LV PW:         1.40 cm  LV E/e' medial:  16.5 LV IVS:        1.00 cm  LV e' lateral:   10.30 cm/s LVOT diam:     1.90 cm  LV E/e' lateral: 11.8 LV SV:         72 LV SV Index:   33 LVOT Area:     2.84 cm  RIGHT VENTRICLE             IVC RV Basal diam:  3.10 cm     IVC diam: 1.70 cm RV Mid diam:    2.30 cm RV S prime:     12.00 cm/s TAPSE (M-mode): 1.9 cm LEFT ATRIUM             Index       RIGHT ATRIUM           Index LA diam:        5.20 cm 2.36 cm/m  RA Area:     17.90 cm LA Vol (A2C):   56.9 ml 25.88 ml/m RA Volume:   47.00 ml  21.38 ml/m LA Vol (A4C):   72.1 ml 32.79 ml/m LA Biplane Vol: 65.1 ml 29.61 ml/m  AORTIC VALVE LVOT Vmax:   129.00 cm/s LVOT Vmean:  99.000 cm/s LVOT VTI:    0.253 m  AORTA Ao Root diam: 3.10 cm Ao Asc diam:  3.30 cm MITRAL VALVE                TRICUSPID VALVE MV Area (PHT): 4.18 cm     TR Peak grad:   23.6 mmHg MV Decel Time: 182 msec     TR Vmax:        243.00 cm/s MV E velocity: 122.00 cm/s                             SHUNTS                              Systemic VTI:  0.25 m                             Systemic Diam: 1.90 cm Buford Dresser MD Electronically signed by Buford Dresser MD Signature Date/Time: 09/07/2020/7:50:04 PM    Final       Medications:     Scheduled Medications: . dapagliflozin propanediol  5 mg Oral BID AC  . fenofibrate  160 mg Oral Daily  . furosemide  60 mg Oral BID  . icosapent Ethyl  2 g Oral BID  . insulin glargine  15 Units Subcutaneous QHS  . metoprolol succinate  25 mg Oral BID  . potassium chloride  20 mEq Oral BID  . rosuvastatin  10 mg Oral Daily  . sodium  chloride flush  3 mL Intravenous Q12H  . sodium chloride flush  3 mL Intravenous Q12H     Infusions: . sodium chloride    . sodium chloride    . sodium chloride 10 mL/hr at 09/08/20 0657  . amiodarone 30 mg/hr (09/08/20 0649)     PRN Medications:  sodium chloride, sodium chloride, acetaminophen, ondansetron (ZOFRAN) IV, sodium chloride flush, sodium chloride flush, tobramycin-dexamethasone     Assessment/Plan  1. WCT: - ? VT vs Afib w/ Aberrancy, HR 222 bpm, broke w/ IV amio - K 4.1, Mg 2.3, HS trop 13>>12. Post conversion EKG w/ QT/QTc 443/546 ms - no associated ischemic CP  - ? Caused by Tikosyn - EP to admit for further cardiac monitoring - Now on amiodarone drip and off tikosyn. - ECHO EF 55-60%Update  - Cath later today    2. 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 - given WCT concerning for VT,  - Repeat ECHO EF 55-60% . Heart cath today. Renal function stable.  - Continue home regimen             Toprol XL 25 mg bid             Entresto 49-51 mg bid              Lasix 60 mg bid   3. Atrial Fibrillation/ Flutter - prior DCCVs and Afib ablation in 2018 - stop Tikosyn w/ WCT  - ? Afib w/ aberrancy. Post conversion EKGs show ? Afib/flutter   - Plan transition to amiodarone - Continue Eliquis 5 mg bid   4. CAD - coronary CTA in 2018 showed coronary calcium score in the 82nd percentile with nonobstructive coronary disease - Hs Trop 13>>12 - No chest pain.   - ECHO EF 55-60^.  Plan for cath today.  - has not been on ASA due to Eliquis - continue ? blocker and lipid lowering agents  5. T2DM - SSI -  Continue home regimen             Lantus 15 U daily              Dapagliflozin-Metformin  -  Continue statin   6. OSA - CPAP QHS     Length of Stay: 1  Amy Clegg, NP  09/08/2020, 8:16 AM  Advanced Heart Failure Team Pager [phone] (M-F; 7a - 4p)  Please contact [facility] for night-coverage after hours (4p -7a ) and weekends on [email or web address]  Patient seen with NP, agree with the above note.   She had further VT runs overnight. Had episodes of lightheadedness but no syncope.  Events were self-limited.  No VT since around 4 am.  She remains on amiodarone gtt.   Coronary angiography done today, no significant disease noted.   General: NAD Neck: No JVD, no thyromegaly or thyroid nodule.  Lungs: Clear to auscultation bilaterally with normal respiratory effort. CV: Nondisplaced PMI.  Heart regular S1/S2, no S3/S4, no murmur.  No peripheral edema.   Abdomen: Soft, nontender, no hepatosplenomegaly, no distention.  Skin: Intact without lesions or rashes.  Neurologic: Alert and oriented x 3.  Psych: Normal affect. Extremities: No clubbing or cyanosis.  HEENT: Normal.   1. 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  This is not the typical Tikosyn-induced polymorphic  VT.  No changes in Tikosyn dosing recently and no new meds.  Echo shows normal EF and electrolytes normal.  - She is now off Tikosyn, have transitioned over to amiodarone gtt per EP.  Continue to load amiodarone.  - Cath today did not show significant coronary disease.  - Next step will be cardiac MRI to assess for  scar/infiltrative disease, ordered for today.  2. Atrial fibrillation/flutter: Paroxysmal. 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 She was admitted in 1/18 with atrial fibrillation/RVR and CHF. She was diuresed and had DCCV back to NSR. She had atrial fibrillation ablation in 4/18. In 5/19, she went into atrial fibrillation transiently. In 10/19, she was back in atrial fibrillation and was admitted for Tikosyn initiation and cardioversion.Atypical atrial flutter noted in 12/21 with DCCV to NSR. Now, back in atypical atrial flutter.  - Restart Eliquis tonight.   -Switching from Tikosyn to amiodarone.  If she stays in flutter, should get cardioversion in the future.  3. Claudication: Left leg claudication with absent left PT pulse. 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  4. Chronic systolic =>diastolic CHF: EF 91-63% on echo in 1/18, down from 50-55% in 4/15. 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 Echo yesterday showed LV EF 55-60% on my read. NYHA class II symptoms, she is not volume overloadedbyexam. - Restart Lasix 60 mg po bid this evening.  - Continue Toprol XL 25 mg bid.  - Hold Entresto for now with soft BP.  - ?rash from spironolactone, now on eplerenone 25 mg daily.  - Continue dapagliflozin.  5. CAD: Coronary CTA in 2018 showed coronary calcium score in the 82nd percentile with nonobstructive coronary disease.  Cath today showed only luminal irregularities.  - not on ASA 81 as she is taking Eliqius.  - ContinueVascepa and Livalo.  6. COPD: History of smoking.  7. OSA: CPAP.   [name] 09/08/2020 1:26 PM

## 2020-09-08 NOTE — ED Notes (Signed)
Long run of vtach

## 2020-09-08 NOTE — Progress Notes (Signed)
Pt has home cpap and is able to place herself on without assistance. RT will continue to monitor.

## 2020-09-08 NOTE — Progress Notes (Addendum)
Electrophysiology Rounding Note  Patient Name: Heidi Stark Date of Encounter: 09/08/2020  Primary Cardiologist: No primary care provider on file. Electrophysiologist: Neaveh Belanger [name], Stark   Subjective   "what started all this?"  Continue to have runs of VT throughout the night, quiescent since 0350. She felt "OK" even during the episodes. No syncope or near syncope.   Inpatient Medications    Scheduled Meds:  dapagliflozin propanediol  5 mg Oral BID AC   fenofibrate  160 mg Oral Daily   furosemide  60 mg Oral BID   icosapent Ethyl  2 g Oral BID   insulin glargine  15 Units Subcutaneous QHS   metoprolol succinate  25 mg Oral BID   potassium chloride  20 mEq Oral BID   rosuvastatin  10 mg Oral Daily   sodium chloride flush  3 mL Intravenous Q12H   sodium chloride flush  3 mL Intravenous Q12H   Continuous Infusions:  sodium chloride     sodium chloride     sodium chloride 10 mL/hr at 09/08/20 0657   amiodarone 30 mg/hr (09/08/20 0649)   PRN Meds: sodium chloride, sodium chloride, acetaminophen, ondansetron (ZOFRAN) IV, sodium chloride flush, sodium chloride flush, tobramycin-dexamethasone   Vital Signs    Vitals:   09/08/20 0300 09/08/20 0330 09/08/20 0400 09/08/20 0430  BP: (!) 85/56 (!) 97/53 (!) 90/57 91/64  Pulse: 88 88 89 86  Resp: (!) 24 (!) 21 20 19   Temp:      TempSrc:      SpO2: 96% 92% 96% 95%  Weight:      Height:        Intake/Output Summary (Last 24 hours) at 09/08/2020 5093 Last data filed at 09/07/2020 1958 Gross per 24 hour  Intake 1250 ml  Output --  Net 1250 ml   Filed Weights   09/07/20 0935  Weight: 113.4 kg    Physical Exam    GEN- The patient is well appearing, alert and oriented x 3 today.   Head- normocephalic, atraumatic Eyes-  Sclera clear, conjunctiva pink Ears- hearing intact Oropharynx- clear Neck- supple Lungs- Clear to ausculation bilaterally, normal work of breathing Heart- Regular rate and rhythm, no murmurs,  rubs or gallops GI- soft, NT, ND, + BS Extremities- no clubbing or cyanosis. No edema Skin- no rash or lesion Psych- euthymic mood, full affect Neuro- strength and sensation are intact  Labs    CBC Recent Labs    09/07/20 1004 09/07/20 1015  WBC 8.1  --   NEUTROABS 5.2  --   HGB 16.7* 17.7*  HCT 50.3* 52.0*  MCV 93.8  --   PLT 317  --    Basic Metabolic Panel Recent Labs    09/07/20 1004 09/07/20 1015 09/08/20 0249  NA 137 138 137  K 4.1 3.9 3.9  CL 97* 100 101  CO2 25  --  23  GLUCOSE 232* 229* 177*  BUN 27* 34* 20  CREATININE 1.13* 1.00 0.92  CALCIUM 9.0  --  8.3*  MG 2.3  --   --   PHOS 4.0  --   --    Liver Function Tests Recent Labs    09/07/20 1004  AST 32  ALT 22  ALKPHOS 40  BILITOT 0.7  PROT 7.1  ALBUMIN 3.6   No results for input(s): LIPASE, AMYLASE in the last 72 hours. Cardiac Enzymes No results for input(s): CKTOTAL, CKMB, CKMBINDEX, TROPONINI in the last 72 hours.   Telemetry  AFL with controlled rates in the 70-80s currently, continued to have runs of VT throughout the night in 180-220 range (personally reviewed)  Radiology    DG Chest Port 1 View  Result Date: 09/07/2020 CLINICAL DATA:  Near syncope. EXAM: PORTABLE CHEST 1 VIEW COMPARISON:  08/09/2016. FINDINGS: Cardiomegaly. Low lung volumes. No focal infiltrate. No pleural effusion or pneumothorax. IMPRESSION: Cardiomegaly. No acute pulmonary disease. Electronically Signed   By: Marcello Moores  Register   On: 09/07/2020 10:19   ECHOCARDIOGRAM COMPLETE  Result Date: 09/07/2020    ECHOCARDIOGRAM REPORT   Patient Name:   Heidi Stark Date of Exam: 09/07/2020 Medical Rec #:  [id number]      Height:       66.0 in Accession #:    [id number]     Weight:       250.0 lb Date of Birth:  January 20, 1941      BSA:          2.199 m Patient Age:    80 years       BP:           104/60 mmHg Patient Gender: F              HR:           89 bpm. Exam Location:  Inpatient Procedure: 2D Echo, Cardiac Doppler and  Color Doppler Indications:    I47.2 Ventricular tachycardia  History:        Patient has prior history of Echocardiogram examinations, most                 recent 09/07/2020. CHF, COPD, Arrythmias:Atrial Fibrillation,                 Signs/Symptoms:Dyspnea; Risk Factors:Hypertension, Diabetes,                 Dyslipidemia and Sleep Apnea.  Sonographer:    Tiffany Dance Referring Phys: [id number] [place]  1. Left ventricular ejection fraction, by estimation, is 50 to 55%. The left ventricle has low normal function. The left ventricle has no regional wall motion abnormalities. Left ventricular diastolic parameters are indeterminate.  2. Right ventricular systolic function is normal. The right ventricular size is normal. There is normal pulmonary artery systolic pressure.  3. Left atrial size was mild to moderately dilated.  4. Right atrial size was mildly dilated.  5. A small pericardial effusion is present.  6. The mitral valve is abnormal. Trivial mitral valve regurgitation. Moderate mitral annular calcification.  7. The aortic valve is grossly normal. Aortic valve regurgitation is not visualized. No aortic stenosis is present.  8. The inferior vena cava is normal in size with greater than 50% respiratory variability, suggesting right atrial pressure of 3 mmHg. Comparison(s): No significant change from prior study. FINDINGS  Left Ventricle: Left ventricular ejection fraction, by estimation, is 50 to 55%. The left ventricle has low normal function. The left ventricle has no regional wall motion abnormalities. The left ventricular internal cavity size was normal in size. There is borderline left ventricular hypertrophy. Left ventricular diastolic parameters are indeterminate. Right Ventricle: The right ventricular size is normal. Right vetricular wall thickness was not well visualized. Right ventricular systolic function is normal. There is normal pulmonary artery systolic pressure. The  tricuspid regurgitant velocity is 2.43 m/s, and with an assumed right atrial pressure of 3 mmHg, the estimated right ventricular systolic pressure is 28.7 mmHg. Left Atrium: Left atrial size was mild to moderately dilated. Right Atrium:  Right atrial size was mildly dilated. Pericardium: A small pericardial effusion is present. Presence of pericardial fat pad. Mitral Valve: The mitral valve is abnormal. Moderate mitral annular calcification. Trivial mitral valve regurgitation. Tricuspid Valve: The tricuspid valve is normal in structure. Tricuspid valve regurgitation is trivial. Aortic Valve: The aortic valve is grossly normal. There is mild to moderate aortic valve annular calcification. Aortic valve regurgitation is not visualized. No aortic stenosis is present. Pulmonic Valve: The pulmonic valve was not well visualized. Pulmonic valve regurgitation is not visualized. Aorta: The aortic root, ascending aorta and aortic arch are all structurally normal, with no evidence of dilitation or obstruction. Venous: The inferior vena cava is normal in size with greater than 50% respiratory variability, suggesting right atrial pressure of 3 mmHg. IAS/Shunts: The atrial septum is grossly normal.  LEFT VENTRICLE PLAX 2D LVIDd:         5.00 cm  Diastology LVIDs:         4.20 cm  LV e' medial:    7.40 cm/s LV PW:         1.40 cm  LV E/e' medial:  16.5 LV IVS:        1.00 cm  LV e' lateral:   10.30 cm/s LVOT diam:     1.90 cm  LV E/e' lateral: 11.8 LV SV:         72 LV SV Index:   33 LVOT Area:     2.84 cm  RIGHT VENTRICLE             IVC RV Basal diam:  3.10 cm     IVC diam: 1.70 cm RV Mid diam:    2.30 cm RV S prime:     12.00 cm/s TAPSE (M-mode): 1.9 cm LEFT ATRIUM             Index       RIGHT ATRIUM           Index LA diam:        5.20 cm 2.36 cm/m  RA Area:     17.90 cm LA Vol (A2C):   56.9 ml 25.88 ml/m RA Volume:   47.00 ml  21.38 ml/m LA Vol (A4C):   72.1 ml 32.79 ml/m LA Biplane Vol: 65.1 ml 29.61 ml/m  AORTIC VALVE  LVOT Vmax:   129.00 cm/s LVOT Vmean:  99.000 cm/s LVOT VTI:    0.253 m  AORTA Ao Root diam: 3.10 cm Ao Asc diam:  3.30 cm MITRAL VALVE                TRICUSPID VALVE MV Area (PHT): 4.18 cm     TR Peak grad:   23.6 mmHg MV Decel Time: 182 msec     TR Vmax:        243.00 cm/s MV E velocity: 122.00 cm/s                             SHUNTS                             Systemic VTI:  0.25 m                             Systemic Diam: 1.90 cm Heidi Stark Electronically signed by Heidi Stark Signature Date/Time: 09/07/2020/7:50:04 PM    Final  Patient Profile     Heidi Stark is a 80 y.o. female with a history of Afib and flutter, Chronic anticoagulation on Eliquis 5 mg BID, CHA2DS2VASC of at least 9, chronic systolic CHF, NICM (by cardiac CT), Calcium score 82 percentile but no invasive ischemic work up, COPD, and OSA who presented to [facility] via EMS with periods of lightheadedness and near syncope x 1 week.   Assessment & Plan    1.  VT Continue IV amiodarone. Can bolus as needed.  Consider transition to po post cath if remains quiescent. Otherwise, continue load for today.  No more tikosyn.  Keep K > 4.0 and Mg > 2.0  Echo 2/22 with LVEF 50-55% Plan for [facility] today per Dr. [name].    2. Persistent AF ( s/p AF ablation 2018) / Atypical flutter Overall had been doing well s/p AF ablation 2018 on tikosyn.  Had Flutter in December and underwent [facility].  She is now back in flutter, suspect in setting of acute episodes over the past week.  Heidi Stark need close outpatient follow up on amio for surveillance and consideration of NSR/DCCV  Not likely candidate for ablation.   3. Chronic systolic CHF with previously normalized EF EF previously normalized by Echo 12/2017. (30-35% in 07/2016) Continue GDMT as BP tolerates.    4. COPD Encouraged cessation.  Monitor closely on amio. ? PFTs   5. CAD Coronary CTA showed "non-obstructive" CAD with Ca score in 82nd percentile.  Plan for  [facility] today.    For questions or updates, please contact [place] Please consult [email or web address] for contact info under Cardiology/STEMI.  Signed, Heidi Friar, Heidi Stark  09/08/2020, 7:22 AM   I have seen and examined this patient with Heidi Stark.  Agree with above, note added to reflect my findings.  On exam, RRR, no murmurs. Continued to have VT overnight. Stephanos Fan continue amiodarone ggt for now. Plan for [facility] today. If negative Veda Arrellano plan for [facility].     Labrittany Wechter M. Janiyla Long Stark 09/08/2020 10:57 AM

## 2020-09-08 NOTE — ED Notes (Addendum)
First long run of [place] since about 2330 on 09/07/20.  Rates in the 200's.  ECG captured

## 2020-09-08 NOTE — ED Notes (Signed)
Daughter at bedside.

## 2020-09-08 NOTE — Care Plan (Signed)
Cardiology Progress Note  Called for possible "VF" by RN tonight, patient felt pre-syncopal during the event.   Review of telemetry shows 3 runs of sustained MMVT between 20:30 and 21:00 lasting between 70-90 seconds. The baseline rhythm is slow AFL/rapid EAT with 2:1 conduction (V rate 94). The VT is initiated by PVCs with underlying dissociated EAT then spontaneously terminates back to 2:1 EAT. She never actually lost consciousness with the episodes.   She has a history of AF/AFL with prior PVI ablation, on Tikosyn. She has HF with recovered EF. She presented with weakness/dizziness and found to have recurrent WCT.  TTE shows preserved LVEF and coronary angiography today was normal.  She is currently on amiodarone 30mg /hr. Tikosyn was stopped on admission.  Metoprolol also on MAR but has not been given today.  Last electrolytes were WNL.  Will put on Zoll with pads in place as well as 12-lead ECG to try localizing VT if we can capture. Check BMET and Mg. Increase amiodarone to 60mg /hr and bolus 150mg  now. Start propranolol 10mg  PO TID in place of metoprolol. Titrate up as tolerated. QTc on telemetry looks okay. Could consider lidocaine or procainamide next if needed. If she has any further VT will probably move to ICU.

## 2020-09-08 NOTE — Interval H&P Note (Signed)
History and Physical Interval Note:  09/08/2020 12:34 PM  Heidi Stark  has presented today for surgery, with the diagnosis of heart failure, VT.  The various methods of treatment have been discussed with the patient and family. After consideration of risks, benefits and other options for treatment, the patient has consented to  Procedure(s): RIGHT/LEFT HEART CATH AND CORONARY ANGIOGRAPHY (N/A) as a surgical intervention.  The patient's history has been reviewed, patient examined, no change in status, stable for surgery.  I have reviewed the patient's chart and labs.  Questions were answered to the patient's satisfaction.     Raffi Milstein [employer]

## 2020-09-08 NOTE — ED Notes (Signed)
Long run of [employer]

## 2020-09-09 ENCOUNTER — Encounter (HOSPITAL_COMMUNITY): Payer: Self-pay | Admitting: Cardiology

## 2020-09-09 ENCOUNTER — Inpatient Hospital Stay (HOSPITAL_COMMUNITY): Payer: Medicare Other

## 2020-09-09 DIAGNOSIS — I5042 Chronic combined systolic (congestive) and diastolic (congestive) heart failure: Secondary | ICD-10-CM | POA: Diagnosis not present

## 2020-09-09 DIAGNOSIS — I2510 Atherosclerotic heart disease of native coronary artery without angina pectoris: Secondary | ICD-10-CM | POA: Diagnosis not present

## 2020-09-09 DIAGNOSIS — I472 Ventricular tachycardia: Secondary | ICD-10-CM

## 2020-09-09 LAB — BASIC METABOLIC PANEL
Anion gap: 10 (ref 5–15)
BUN: 14 mg/dL (ref 8–23)
CO2: 27 mmol/L (ref 22–32)
Calcium: 8.1 mg/dL — ABNORMAL LOW (ref 8.9–10.3)
Chloride: 101 mmol/L (ref 98–111)
Creatinine, Ser: 0.9 mg/dL (ref 0.44–1.00)
GFR, Estimated: >60 mL/min (ref >60)
Glucose, Bld: 161 mg/dL — ABNORMAL HIGH (ref 70–99)
Potassium: 4.3 mmol/L (ref 3.5–5.1)
Sodium: 138 mmol/L (ref 135–145)

## 2020-09-09 LAB — MAGNESIUM: Magnesium: 2.2 mg/dL (ref 1.7–2.4)

## 2020-09-09 MED ORDER — METOPROLOL SUCCINATE ER 25 MG PO TB24
25.0000 mg | ORAL_TABLET | Freq: Two times a day (BID) | ORAL | Status: DC
  Administered 2020-09-09 – 2020-09-10 (×3): 25 mg via ORAL
  Filled 2020-09-09 (×4): qty 1

## 2020-09-09 MED ORDER — GADOBUTROL 1 MMOL/ML IV SOLN
10.0000 mL | Freq: Once | INTRAVENOUS | Status: AC | PRN
  Administered 2020-09-09: 10 mL via INTRAVENOUS

## 2020-09-09 NOTE — Progress Notes (Signed)
Patient ID: Heidi Stark, female   DOB: 06-May-1941, 80 y.o.   MRN: [id number]     Advanced Heart Failure Rounding Note  PCP-Cardiologist: No primary care provider on file.   Subjective:    3 episodes VT lasting 1-2 minutes overnight, no syncope (dizzy).   No complaints currently.    Objective:   Weight Range: 115.1 kg Body mass index is 40.95 kg/m.   Vital Signs:   Temp:  [98.1 F (36.7 C)-98.4 F (36.9 C)] 98.3 F (36.8 C) (02/24 0835) Pulse Rate:  [68-94] 94 (02/24 0835) Resp:  [18-19] 18 (02/24 0835) BP: (99-117)/(43-85) 107/52 (02/24 0835) SpO2:  [92 %-96 %] 96 % (02/24 0835) Weight:  [115.1 kg] 115.1 kg (02/24 0309) Last BM Date: 09/08/20  Weight change: Filed Weights   09/07/20 0935 09/09/20 0309  Weight: 113.4 kg 115.1 kg    Intake/Output:   Intake/Output Summary (Last 24 hours) at 09/09/2020 1456 Last data filed at 09/09/2020 1022 Gross per 24 hour  Intake 874.59 ml  Output 1700 ml  Net -825.41 ml      Physical Exam    General: NAD Neck: No JVD, no thyromegaly or thyroid nodule.  Lungs: Clear to auscultation bilaterally with normal respiratory effort. CV: Nondisplaced PMI.  Heart regular S1/S2, no S3/S4, no murmur.  No peripheral edema.  Abdomen: Soft, nontender, no hepatosplenomegaly, no distention.  Skin: Intact without lesions or rashes.  Neurologic: Alert and oriented x 3.  Psych: Normal affect. Extremities: No clubbing or cyanosis.  HEENT: Normal.   Telemetry   Atypical atrial flutter rate 80s (personally reviewed).   EKG    n/a  Labs    CBC Recent Labs    09/07/20 1004 09/07/20 1015  WBC 8.1  --   NEUTROABS 5.2  --   HGB 16.7* 17.7*  HCT 50.3* 52.0*  MCV 93.8  --   PLT 317  --    Basic Metabolic Panel Recent Labs    09/07/20 1004 09/07/20 1015 09/08/20 2139 09/09/20 0236 09/09/20 1141  NA 137   < > 136 138  --   K 4.1   < > 3.4* 4.3  --   CL 97*   < > 99 101  --   CO2 25   < > 25 27  --   GLUCOSE 232*   < >  168* 161*  --   BUN 27*   < > 16 14  --   CREATININE 1.13*   < > 0.79 0.90  --   CALCIUM 9.0   < > 8.0* 8.1*  --   MG 2.3  --  2.1  --  2.2  PHOS 4.0  --   --   --   --    < > = values in this interval not displayed.   Liver Function Tests Recent Labs    09/07/20 1004  AST 32  ALT 22  ALKPHOS 40  BILITOT 0.7  PROT 7.1  ALBUMIN 3.6   No results for input(s): LIPASE, AMYLASE in the last 72 hours. Cardiac Enzymes No results for input(s): CKTOTAL, CKMB, CKMBINDEX, TROPONINI in the last 72 hours.  BNP: BNP (last 3 results) Recent Labs    09/07/20 1004  BNP 85.9    ProBNP (last 3 results) No results for input(s): PROBNP in the last 8760 hours.   D-Dimer No results for input(s): DDIMER in the last 72 hours. Hemoglobin A1C No results for input(s): HGBA1C in the last 72 hours. Fasting Lipid  Panel No results for input(s): CHOL, HDL, LDLCALC, TRIG, CHOLHDL, LDLDIRECT in the last 72 hours. Thyroid Function Tests Recent Labs    09/07/20 1004  TSH 3.251    Other results:   Imaging    No results found.   Medications:     Scheduled Medications: . apixaban  5 mg Oral BID  . dapagliflozin propanediol  5 mg Oral BID AC  . fenofibrate  160 mg Oral Daily  . furosemide  60 mg Oral BID  . icosapent Ethyl  2 g Oral BID  . insulin glargine  15 Units Subcutaneous QHS  . potassium chloride  20 mEq Oral BID  . propranolol  10 mg Oral TID  . rosuvastatin  10 mg Oral Daily  . sodium chloride flush  3 mL Intravenous Q12H  . sodium chloride flush  3 mL Intravenous Q12H  . sodium chloride flush  3 mL Intravenous Q12H    Infusions: . sodium chloride    . sodium chloride    . amiodarone 60 mg/hr (09/09/20 1129)    PRN Medications: sodium chloride, sodium chloride, acetaminophen, ondansetron (ZOFRAN) IV, sodium chloride flush, sodium chloride flush, tobramycin-dexamethasone     Assessment/Plan   1. 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  This is not the typical Tikosyn-induced polymorphic VT.  No changes in Tikosyn dosing recently and no new meds.  Echo shows normal EF and electrolytes normal. Cath did not show significant coronary disease.   - She is now off Tikosyn, have transitioned over to amiodarone gtt per EP.  Continue to load amiodarone.  - Next step will be cardiac MRI to assess for scar/infiltrative disease, to be done today.  2. Atrial fibrillation/flutter: Paroxysmal. 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 She was admitted in 1/18 with atrial fibrillation/RVR and CHF. She was diuresed and had DCCV back to NSR. She had atrial fibrillation ablation in 4/18. In 5/19, she went into atrial fibrillation transiently. In 10/19, she was back in atrial fibrillation and was admitted for Tikosyn initiation and cardioversion.Atypical atrial flutter noted in 12/21 with DCCV to NSR. Now, back in atypical atrial flutter.  - Continue Eliquis.    -Switching from Tikosyn to amiodarone.  If she stays in flutter, should get cardioversion in the future.  3. Claudication: Left leg claudication with absent left PT pulse. 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  4. 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 Echo yesterday showed LV EF 55-60% on my read. NYHA class II symptoms, she is not volume overloadedbyexam. - Continue Lasix 60 mg po bid - Continue Toprol XL 25 mg bid.  - Hold Entresto for now with soft BP.  - ?rash from spironolactone, now on eplerenone 25 mg daily.  - Continue dapagliflozin.  5. CAD: Coronary CTA in 2018 showed coronary calcium score in the 82nd percentile with nonobstructive coronary disease.  Cath today showed only luminal  irregularities.  - not on ASA 81 as she is taking Eliqius.  - ContinueVascepa and Livalo.  6. COPD: History of smoking.  7. OSA: CPAP.   [name] 09/09/2020 2:56 PM

## 2020-09-09 NOTE — Progress Notes (Signed)
Pt has her home cpap and is able to place herself on w/out assistance. RT will continue to monitor.

## 2020-09-09 NOTE — Progress Notes (Addendum)
Electrophysiology Rounding Note  Patient Name: Heidi Stark Date of Encounter: 09/09/2020  Primary Cardiologist: No primary care provider on file. Electrophysiologist: Mattilyn Crites [name], MD   Subjective   "I had more episodes last night". "When can I go home?"  Inpatient Medications    Scheduled Meds:  apixaban  5 mg Oral BID   dapagliflozin propanediol  5 mg Oral BID AC   fenofibrate  160 mg Oral Daily   furosemide  60 mg Oral BID   icosapent Ethyl  2 g Oral BID   insulin glargine  15 Units Subcutaneous QHS   potassium chloride  20 mEq Oral BID   propranolol  10 mg Oral TID   rosuvastatin  10 mg Oral Daily   sodium chloride flush  3 mL Intravenous Q12H   sodium chloride flush  3 mL Intravenous Q12H   sodium chloride flush  3 mL Intravenous Q12H   Continuous Infusions:  sodium chloride     sodium chloride     amiodarone 60 mg/hr (09/09/20 0539)   PRN Meds: sodium chloride, sodium chloride, acetaminophen, ondansetron (ZOFRAN) IV, sodium chloride flush, sodium chloride flush, tobramycin-dexamethasone   Vital Signs    Vitals:   09/08/20 2026 09/09/20 0001 09/09/20 0309 09/09/20 0835  BP: 117/82 102/85 (!) 99/43 (!) 107/52  Pulse:  79 68 94  Resp: 19  18 18   Temp: 98.1 F (36.7 C) 98.4 F (36.9 C) 98.3 F (36.8 C)   TempSrc: Oral Oral Oral   SpO2:  92% 95% 96%  Weight:   115.1 kg   Height:   5\' 6"  (1.676 m)     Intake/Output Summary (Last 24 hours) at 09/09/2020 0849 Last data filed at 09/09/2020 0315 Gross per 24 hour  Intake 871.59 ml  Output 1700 ml  Net -828.41 ml   Filed Weights   09/07/20 0935 09/09/20 0309  Weight: 113.4 kg 115.1 kg    Physical Exam    GEN- The patient is well appearing, alert and oriented x 3 today.   Head- normocephalic, atraumatic Eyes-  Sclera clear, conjunctiva pink Ears- hearing intact Oropharynx- clear Neck- supple Lungs- Clear to ausculation bilaterally, normal work of breathing Heart- Regular rate and rhythm,  no murmurs, rubs or gallops GI- soft, NT, ND, + BS Extremities- no clubbing or cyanosis. No edema Skin- no rash or lesion Psych- euthymic mood, full affect Neuro- strength and sensation are intact  Labs    CBC Recent Labs    09/07/20 1004 09/07/20 1015  WBC 8.1  --   NEUTROABS 5.2  --   HGB 16.7* 17.7*  HCT 50.3* 52.0*  MCV 93.8  --   PLT 317  --    Basic Metabolic Panel Recent Labs    09/07/20 1004 09/07/20 1015 09/08/20 2139 09/09/20 0236  NA 137   < > 136 138  K 4.1   < > 3.4* 4.3  CL 97*   < > 99 101  CO2 25   < > 25 27  GLUCOSE 232*   < > 168* 161*  BUN 27*   < > 16 14  CREATININE 1.13*   < > 0.79 0.90  CALCIUM 9.0   < > 8.0* 8.1*  MG 2.3  --  2.1  --   PHOS 4.0  --   --   --    < > = values in this interval not displayed.   Liver Function Tests Recent Labs    09/07/20 1004  AST  32  ALT 22  ALKPHOS 40  BILITOT 0.7  PROT 7.1  ALBUMIN 3.6   No results for input(s): LIPASE, AMYLASE in the last 72 hours. Cardiac Enzymes No results for input(s): CKTOTAL, CKMB, CKMBINDEX, TROPONINI in the last 72 hours.   Telemetry    Atrial flutter with ventricular rates 60-70s, overnight had recurrent runs of VT (approximately 3 episodes in ~ 15 minute time frame, each 1.5 - 2 minutes long) (personally reviewed)  Radiology    CARDIAC CATHETERIZATION  Result Date: 09/08/2020 No significant coronary disease noted.   DG Chest Port 1 View  Result Date: 09/07/2020 CLINICAL DATA:  Near syncope. EXAM: PORTABLE CHEST 1 VIEW COMPARISON:  08/09/2016. FINDINGS: Cardiomegaly. Low lung volumes. No focal infiltrate. No pleural effusion or pneumothorax. IMPRESSION: Cardiomegaly. No acute pulmonary disease. Electronically Signed   By: Marcello Moores  Register   On: 09/07/2020 10:19   ECHOCARDIOGRAM COMPLETE  Result Date: 09/07/2020    ECHOCARDIOGRAM REPORT   Patient Name:   [name] Date of Exam: 09/07/2020 Medical Rec #:  [id number]      Height:       66.0 in Accession #:     [id number]     Weight:       250.0 lb Date of Birth:  10-16-1940      BSA:          2.199 m Patient Age:    58 years       BP:           104/60 mmHg Patient Gender: F              HR:           89 bpm. Exam Location:  Inpatient Procedure: 2D Echo, Cardiac Doppler and Color Doppler Indications:    I47.2 Ventricular tachycardia  History:        Patient has prior history of Echocardiogram examinations, most                 recent 09/07/2020. CHF, COPD, Arrythmias:Atrial Fibrillation,                 Signs/Symptoms:Dyspnea; Risk Factors:Hypertension, Diabetes,                 Dyslipidemia and Sleep Apnea.  Sonographer:    Tiffany Dance Referring Phys: [id number] [place]  1. Left ventricular ejection fraction, by estimation, is 50 to 55%. The left ventricle has low normal function. The left ventricle has no regional wall motion abnormalities. Left ventricular diastolic parameters are indeterminate.  2. Right ventricular systolic function is normal. The right ventricular size is normal. There is normal pulmonary artery systolic pressure.  3. Left atrial size was mild to moderately dilated.  4. Right atrial size was mildly dilated.  5. A small pericardial effusion is present.  6. The mitral valve is abnormal. Trivial mitral valve regurgitation. Moderate mitral annular calcification.  7. The aortic valve is grossly normal. Aortic valve regurgitation is not visualized. No aortic stenosis is present.  8. The inferior vena cava is normal in size with greater than 50% respiratory variability, suggesting right atrial pressure of 3 mmHg. Comparison(s): No significant change from prior study. FINDINGS  Left Ventricle: Left ventricular ejection fraction, by estimation, is 50 to 55%. The left ventricle has low normal function. The left ventricle has no regional wall motion abnormalities. The left ventricular internal cavity size was normal in size. There is borderline left ventricular hypertrophy. Left  ventricular diastolic  parameters are indeterminate. Right Ventricle: The right ventricular size is normal. Right vetricular wall thickness was not well visualized. Right ventricular systolic function is normal. There is normal pulmonary artery systolic pressure. The tricuspid regurgitant velocity is 2.43 m/s, and with an assumed right atrial pressure of 3 mmHg, the estimated right ventricular systolic pressure is 01.0 mmHg. Left Atrium: Left atrial size was mild to moderately dilated. Right Atrium: Right atrial size was mildly dilated. Pericardium: A small pericardial effusion is present. Presence of pericardial fat pad. Mitral Valve: The mitral valve is abnormal. Moderate mitral annular calcification. Trivial mitral valve regurgitation. Tricuspid Valve: The tricuspid valve is normal in structure. Tricuspid valve regurgitation is trivial. Aortic Valve: The aortic valve is grossly normal. There is mild to moderate aortic valve annular calcification. Aortic valve regurgitation is not visualized. No aortic stenosis is present. Pulmonic Valve: The pulmonic valve was not well visualized. Pulmonic valve regurgitation is not visualized. Aorta: The aortic root, ascending aorta and aortic arch are all structurally normal, with no evidence of dilitation or obstruction. Venous: The inferior vena cava is normal in size with greater than 50% respiratory variability, suggesting right atrial pressure of 3 mmHg. IAS/Shunts: The atrial septum is grossly normal.  LEFT VENTRICLE PLAX 2D LVIDd:         5.00 cm  Diastology LVIDs:         4.20 cm  LV e' medial:    7.40 cm/s LV PW:         1.40 cm  LV E/e' medial:  16.5 LV IVS:        1.00 cm  LV e' lateral:   10.30 cm/s LVOT diam:     1.90 cm  LV E/e' lateral: 11.8 LV SV:         72 LV SV Index:   33 LVOT Area:     2.84 cm  RIGHT VENTRICLE             IVC RV Basal diam:  3.10 cm     IVC diam: 1.70 cm RV Mid diam:    2.30 cm RV S prime:     12.00 cm/s TAPSE (M-mode): 1.9 cm LEFT ATRIUM              Index       RIGHT ATRIUM           Index LA diam:        5.20 cm 2.36 cm/m  RA Area:     17.90 cm LA Vol (A2C):   56.9 ml 25.88 ml/m RA Volume:   47.00 ml  21.38 ml/m LA Vol (A4C):   72.1 ml 32.79 ml/m LA Biplane Vol: 65.1 ml 29.61 ml/m  AORTIC VALVE LVOT Vmax:   129.00 cm/s LVOT Vmean:  99.000 cm/s LVOT VTI:    0.253 m  AORTA Ao Root diam: 3.10 cm Ao Asc diam:  3.30 cm MITRAL VALVE                TRICUSPID VALVE MV Area (PHT): 4.18 cm     TR Peak grad:   23.6 mmHg MV Decel Time: 182 msec     TR Vmax:        243.00 cm/s MV E velocity: 122.00 cm/s                             SHUNTS  Systemic VTI:  0.25 m                             Systemic Diam: 1.90 cm Buford Dresser MD Electronically signed by Buford Dresser MD Signature Date/Time: 09/07/2020/7:50:04 PM    Final     Patient Profile     BERENISE HUNTON is a 80 y.o. female with a history of Afib and flutter, Chronic anticoagulation on Eliquis 5 mg BID, CHA2DS2VASC of at least 9, chronic systolic CHF, NICM (by cardiac CT), Calcium score 82 percentile but no invasive ischemic work up, COPD, and OSA who presented to [facility] via EMS with periods of lightheadedness and near syncope x 1 week.   Assessment & Plan    1.  VT Recurrent overnight.  Continue to load IV amiodarone cMRI pending this am for further plan.  LHC yesterday without CAD.  Keep K > 4.0 and Mg > 2.0  Echo 2/22 with LVEF 50-55%   2. Persistent AF ( s/p AF ablation 2018) / Atypical flutter Overall had been doing well s/p AF ablation 2018 on tikosyn.  Had Flutter in December and underwent [facility].  She is now back in flutter, suspect in setting of acute episodes over the past week.  Back on Eliquis, but held 2 dose for cath. Imogen Maddalena need to time eventual [facility] accordingly (or consider TEE).    3. Chronic systolic CHF with previously normalized EF EF previously normalized by Echo 12/2017. (30-35% in 07/2016) Continue GDMT as BP tolerates.     4. COPD Encouraged cessation.  Monitor closely on amio. ? PFTs   5. CAD Coronary CTA showed "non-obstructive" CAD with Ca score in 82nd percentile.  [place] 09/08/20 with no angiographically significant disease.  Plan for cMRI today.   For questions or updates, please contact Josephine Please consult [email or web address] for contact info under Cardiology/STEMI.  Signed, Shirley Friar, PA-C  09/09/2020, 8:49 AM   I have seen and examined this patient with Oda Kilts.  Agree with above, note added to reflect my findings.  On exam, RRR, no murmurs.  Left heart catheterization showed no evidence of coronary artery disease.  Unfortunately she is continued to have episodes of ventricular tachycardia.  She had 3 episodes overnight lasting between 1 and 2 minutes.  Plan for cardiac MRI today.  Likely Chrystopher Stangl need more amiodarone.  Avarose Mervine M. Venisha Boehning MD 09/09/2020 10:31 AM

## 2020-09-10 DIAGNOSIS — I5042 Chronic combined systolic (congestive) and diastolic (congestive) heart failure: Secondary | ICD-10-CM | POA: Diagnosis not present

## 2020-09-10 DIAGNOSIS — I2510 Atherosclerotic heart disease of native coronary artery without angina pectoris: Secondary | ICD-10-CM | POA: Diagnosis not present

## 2020-09-10 LAB — BASIC METABOLIC PANEL
Anion gap: 11 (ref 5–15)
BUN: 12 mg/dL (ref 8–23)
CO2: 26 mmol/L (ref 22–32)
Calcium: 8.3 mg/dL — ABNORMAL LOW (ref 8.9–10.3)
Chloride: 100 mmol/L (ref 98–111)
Creatinine, Ser: 0.95 mg/dL (ref 0.44–1.00)
GFR, Estimated: >60 mL/min (ref >60)
Glucose, Bld: 177 mg/dL — ABNORMAL HIGH (ref 70–99)
Potassium: 3.6 mmol/L (ref 3.5–5.1)
Sodium: 137 mmol/L (ref 135–145)

## 2020-09-10 MED ORDER — AMIODARONE HCL 200 MG PO TABS
400.0000 mg | ORAL_TABLET | Freq: Two times a day (BID) | ORAL | Status: DC
  Administered 2020-09-10 – 2020-09-11 (×3): 400 mg via ORAL
  Filled 2020-09-10 (×3): qty 2

## 2020-09-10 MED ORDER — SACUBITRIL-VALSARTAN 24-26 MG PO TABS
1.0000 | ORAL_TABLET | Freq: Two times a day (BID) | ORAL | Status: DC
  Administered 2020-09-10 (×2): 1 via ORAL
  Filled 2020-09-10 (×3): qty 1

## 2020-09-10 NOTE — TOC Initial Note (Signed)
Transition of Care [facility]) - Initial/Assessment Note    Patient Details  Name: Heidi Stark MRN: [id number] Date of Birth: Aug 21, 1940  [facility]) CM/SW Contact:    Tresa Endo Phone Number: 09/10/2020, 1:58 PM  Clinical Narrative:                 CSW spoke with client about her need for [facility] services. PT declined services stating she lives with her husband and has at home support, her daughter Margarita Mail ) also does daily home checks.   Expected Discharge Plan: Home/Self Care Barriers to Discharge: Continued Medical Work up   Patient Goals and CMS Choice Patient states their goals for this hospitalization and ongoing recovery are:: Return Home CMS Medicare.gov Compare Post Acute Care list provided to:: Other (Comment Required) (NA) Choice offered to / list presented to : Patient  Expected Discharge Plan and Services Expected Discharge Plan: Home/Self Care In-house Referral: Clinical Social Work Discharge Planning Services: NA Post Acute Care Choice: NA Living arrangements for the past 2 months: Single Family Home                 DME Arranged: N/A DME Agency: NA       HH Arranged: NA HH Agency: NA        Prior Living Arrangements/Services Living arrangements for the past 2 months: Single Family Home Lives with:: Spouse Patient language and need for interpreter reviewed:: Yes Do you feel safe going back to the place where you live?: Yes (PT stated her and husband lives together and their daughter visits daily.)      Need for Family Participation in Patient Care: No (Comment) Care giver support system in place?: Yes (comment) Current home services: Other (comment) (NA) Criminal Activity/Legal Involvement Pertinent to Current Situation/Hospitalization: No - Comment as needed  Activities of Daily Living Home Assistive Devices/Equipment: Cane (specify quad or straight),Eyeglasses ADL Screening (condition at time of admission) Patient's  cognitive ability adequate to safely complete daily activities?: Yes Is the patient deaf or have difficulty hearing?: No Does the patient have difficulty seeing, even when wearing glasses/contacts?: No Does the patient have difficulty concentrating, remembering, or making decisions?: No Patient able to express need for assistance with ADLs?: Yes Does the patient have difficulty dressing or bathing?: No Independently performs ADLs?: Yes (appropriate for developmental age) Does the patient have difficulty walking or climbing stairs?: Yes Weakness of Legs: Both Weakness of Arms/Hands: None  Permission Sought/Granted Permission sought to share information with : Other (comment) (PT states shes does not need any services) Permission granted to share information with : No              Emotional Assessment Appearance:: Appears stated age Attitude/Demeanor/Rapport: Engaged Affect (typically observed): Appropriate Orientation: : Oriented to Self,Oriented to Place,Oriented to Situation,Oriented to  Time Alcohol / Substance Use: Not Applicable Psych Involvement: No (comment)  Admission diagnosis:  Ventricular tachyarrhythmia ([place]) [I47.2] VT (ventricular tachycardia) ([place]) [I47.2] Wide-complex tachycardia ([place]) [I47.2] Severe comorbid illness [R69] Patient Active Problem List   Diagnosis Date Noted  . Wide-complex tachycardia ([place]) 09/07/2020  . Ventricular tachyarrhythmia ([place]) 09/07/2020  . VT (ventricular tachycardia) ([place]) 09/07/2020  . Persistent atrial fibrillation ([place]) 04/30/2018  . Wound of left leg 10/01/2017  . Cellulitis of right lower extremity 11/27/2016  . Encounter for assessment for deep vein thrombosis (DVT) 11/14/2016  . Hypokalemia 08/13/2016  . Constipation 08/13/2016  . Acute respiratory failure with hypoxia ([place]) 08/10/2016  . Atrial  fibrillation with rapid ventricular response ([place]) 08/10/2016  . Type 2 diabetes mellitus with hyperglycemia, with long-term  current use of insulin ([place])   . Atherosclerosis of aorta ([place]) 06/26/2016  . Tobacco abuse 06/26/2016  . Colitis 11/24/2014  . Severe obesity (BMI >= 40) ([place]) 11/12/2013  . PAF (paroxysmal atrial fibrillation) ([place]) 11/03/2013  . 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 COLONIC POLYPS 08/16/2010  . PAD (peripheral artery disease) ([place]) 08/15/2010  . HLD (hyperlipidemia) 11/18/2009  . MYALGIA 11/18/2009  . OBESITY 05/14/2007  . PULMONARY NODULE, RIGHT MIDDLE LOBE 05/14/2007  . Type 2 diabetes mellitus with vascular disease ([place]) 03/20/2007  . Obstructive sleep apnea 12/06/2006  . Essential hypertension 12/06/2006  . COPD (chronic obstructive pulmonary disease) ([place]) 12/06/2006   PCP:  Ann Held, DO Pharmacy:   [id number] - [place], [address] [place] [address] Phone: [phone] Fax: [phone]     Social Determinants of Health (SDOH) Interventions    Readmission Risk Interventions No flowsheet data found.

## 2020-09-10 NOTE — Progress Notes (Signed)
Right Wrist PIV infiltration.  Amio Stopped and working to obtain new PIV site.    Richardean Canal RN, BSN, CCRN

## 2020-09-10 NOTE — Progress Notes (Addendum)
Patient ID: Heidi Stark, female   DOB: 04-19-1941, 80 y.o.   MRN: [id number]     Advanced Heart Failure Rounding Note  PCP-Cardiologist: No primary care provider on file.   Subjective:    Cardiac MRI completed yesterday (see impression below).   Remains in rate controlled AFL but no further VT today.   No symptoms at present.   K 3.6  OOB sitting up in chair. Daughter at bedside.    cMRI  IMPRESSION: 1. Normal LV size with thin/akinetic basal to mid inferolateral wall, EF 40%.  2.  Normal RV size with mildly decreased systolic function, EF 40%.  3. Delayed enhancement imaging showed subendocardial LGE in the basal to mid inferolateral wall. This appears to be a coronary disease pattern, but no CAD was seen on recent cath.  Technically difficult study due to difficulty with breath-holding.  Objective:   Weight Range: 114.9 kg Body mass index is 40.9 kg/m.   Vital Signs:   Temp:  [97.9 F (36.6 C)-99 F (37.2 C)] 98.1 F (36.7 C) (02/25 0738) Pulse Rate:  [74-85] 74 (02/25 0824) Resp:  [16-18] 18 (02/25 0738) BP: (100-122)/(58-82) 102/82 (02/25 0824) SpO2:  [91 %-96 %] 92 % (02/25 0738) Weight:  [114.9 kg] 114.9 kg (02/25 0600) Last BM Date: 09/09/20  Weight change: Filed Weights   09/07/20 0935 09/09/20 0309 09/10/20 0600  Weight: 113.4 kg 115.1 kg 114.9 kg    Intake/Output:   Intake/Output Summary (Last 24 hours) at 09/10/2020 1118 Last data filed at 09/10/2020 0130 Gross per 24 hour  Intake 240 ml  Output 600 ml  Net -360 ml      Physical Exam    PHYSICAL EXAM: General:  Well appearing sitting up in chair. No respiratory difficulty HEENT: normal Neck: supple. no JVD. Carotids 2+ bilat; no bruits. No lymphadenopathy or thyromegaly appreciated. Cor: PMI nondisplaced. Regular rate & rhythm. No rubs, gallops or murmurs. Lungs: clear Abdomen: soft, nontender, nondistended. No hepatosplenomegaly. No bruits or masses. Good bowel  sounds. Extremities: no cyanosis, clubbing, rash, edema Neuro: alert & oriented x 3, cranial nerves grossly intact. moves all 4 extremities w/o difficulty. Affect pleasant.   Telemetry   Atypical atrial flutter rate 80s (personally reviewed).   EKG    n/a  Labs    CBC No results for input(s): WBC, NEUTROABS, HGB, HCT, MCV, PLT in the last 72 hours. Basic Metabolic Panel Recent Labs    09/08/20 2139 09/09/20 0236 09/09/20 1141 09/10/20 0132  NA 136 138  --  137  K 3.4* 4.3  --  3.6  CL 99 101  --  100  CO2 25 27  --  26  GLUCOSE 168* 161*  --  177*  BUN 16 14  --  12  CREATININE 0.79 0.90  --  0.95  CALCIUM 8.0* 8.1*  --  8.3*  MG 2.1  --  2.2  --    Liver Function Tests No results for input(s): AST, ALT, ALKPHOS, BILITOT, PROT, ALBUMIN in the last 72 hours. No results for input(s): LIPASE, AMYLASE in the last 72 hours. Cardiac Enzymes No results for input(s): CKTOTAL, CKMB, CKMBINDEX, TROPONINI in the last 72 hours.  BNP: BNP (last 3 results) Recent Labs    09/07/20 1004  BNP 85.9    ProBNP (last 3 results) No results for input(s): PROBNP in the last 8760 hours.   D-Dimer No results for input(s): DDIMER in the last 72 hours. Hemoglobin A1C No results for input(s): HGBA1C  in the last 72 hours. Fasting Lipid Panel No results for input(s): CHOL, HDL, LDLCALC, TRIG, CHOLHDL, LDLDIRECT in the last 72 hours. Thyroid Function Tests No results for input(s): TSH, T4TOTAL, T3FREE, THYROIDAB in the last 72 hours.  Invalid input(s): FREET3  Other results:   Imaging    MR CARDIAC MORPHOLOGY W WO CONTRAST  Result Date: 09/09/2020 CLINICAL DATA:  Ventricular tachycardia EXAM: CARDIAC MRI TECHNIQUE: The patient was scanned on a 1.5 Tesla GE magnet. A dedicated cardiac coil was used. Functional imaging was done using Fiesta sequences. 2,3, and 4 chamber views were done to assess for RWMA's. Modified Simpson's rule using a short axis stack was used to calculate an  ejection fraction on a dedicated work Conservation officer, nature. The patient received 8 cc of Gadavist. After 10 minutes inversion recovery sequences were used to assess for infiltration and scar tissue. FINDINGS: Limited images of the lung fields showed tiny bilateral effusions. Normal left ventricular size and wall thickness. The basal to mid inferolateral wall is thin and akinetic. LV EF 40%. Normal right ventricular size with mildly decreased systolic function, EF 24%. Mildly dilated right atrium, moderately dilated left atrium. Mild mitral regurgitation. Trileaflet aortic valve with trivial regurgitation and no stenosis. Delayed enhancement imaging: Mid-wall/subepicardial late gadolinium enhancement (LGE) in the basal anteroseptal wall. >50% wall thickness subendocardial LGE in the basal to mid inferolateral wall. Measurements: LVEDV 195 mL LVSV 79 mL LVEF 40% RVEDV 179 mL RVSV 68 mL RVEF 38% IMPRESSION: 1. Normal LV size with thin/akinetic basal to mid inferolateral wall, EF 40%. 2.  Normal RV size with mildly decreased systolic function, EF 40%. 3. Delayed enhancement imaging showed subendocardial LGE in the basal to mid inferolateral wall. This appears to be a coronary disease pattern, but no CAD was seen on recent cath. Technically difficult study due to difficulty with breath-holding. Elke Holtry Electronically Signed   By: [name] M.D.   On: 09/09/2020 23:17     Medications:     Scheduled Medications: . amiodarone  400 mg Oral BID  . apixaban  5 mg Oral BID  . dapagliflozin propanediol  5 mg Oral BID AC  . fenofibrate  160 mg Oral Daily  . furosemide  60 mg Oral BID  . icosapent Ethyl  2 g Oral BID  . insulin glargine  15 Units Subcutaneous QHS  . metoprolol succinate  25 mg Oral BID  . potassium chloride  20 mEq Oral BID  . rosuvastatin  10 mg Oral Daily  . sodium chloride flush  3 mL Intravenous Q12H  . sodium chloride flush  3 mL Intravenous Q12H  . sodium chloride  flush  3 mL Intravenous Q12H    Infusions: . sodium chloride    . sodium chloride      PRN Medications: sodium chloride, sodium chloride, acetaminophen, ondansetron (ZOFRAN) IV, sodium chloride flush, sodium chloride flush, tobramycin-dexamethasone     Assessment/Plan   1. 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  This is not the typical Tikosyn-induced polymorphic VT.  No changes in Tikosyn dosing recently and no new meds.  Echo shows normal EF and electrolytes normal. cMRI showed subendocardial LGE in the basal to mid inferolateral wall. This appears to be a coronary disease pattern however Cath did not show significant coronary disease.   - She is now off Tikosyn, have transitioned over to amiodarone gtt per EP. Rhythm stable. Continue to load amiodarone.  2. Atrial fibrillation/flutter: Paroxysmal. 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 She was admitted in 1/18 with atrial fibrillation/RVR and CHF. She was diuresed and had DCCV back to NSR. She had atrial fibrillation ablation in 4/18. In 5/19, she went into atrial fibrillation transiently. In 10/19, she was back in atrial fibrillation and was admitted for Tikosyn initiation and cardioversion.Atypical atrial flutter noted in 12/21 with DCCV to NSR. Now, back in atypical atrial flutter, rate controlled - Continue Eliquis.    -Switching from Tikosyn to amiodarone.  If she stays in flutter, should get cardioversion in the future.  3. Claudication: Left leg claudication with absent left PT pulse. 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  4. 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 This admission, cMRI showed LV EF 40% with RV E 38%,  inferolateral subendocardial LGE. NYHA class II symptoms, she is not volume overloadedbyexam. - Continue Lasix 60 mg po bid - Continue Toprol XL 25 mg bid.  - Can restart Entresto 24/26 bid today.  - ?rash from spironolactone, now on eplerenone 25 mg daily.  - Continue dapagliflozin.  5. CAD: Coronary CTA in 2018 showed coronary calcium score in the 82nd percentile with nonobstructive coronary disease.  Cath this admit showed only luminal irregularities.  - not on ASA 81 as she is taking Eliqius.  - ContinueVascepa and Livalo.  6. COPD: History of smoking.  7. OSA: CPAP.   Per EP, if rhythm remains stable she will be able to go home tomorrow. We will arrange post hospital f/u in the [facility]. Will place appt info in AVS   Brittainy Rosita Fire, PA-C  09/10/2020 11:18 AM  Patient seen with PA, agree with the above note.   She only had a short run NSVT overnight, otherwise quiescent.   No complaints, no dyspnea.   General: NAD Neck: No JVD, no thyromegaly or thyroid nodule.  Lungs: Clear to auscultation bilaterally with normal respiratory effort. CV: Nondisplaced PMI.  Heart regular S1/S2, no S3/S4, no murmur.  No peripheral edema.   Abdomen: Soft, nontender, no hepatosplenomegaly, no distention.  Skin: Intact without lesions or rashes.  Neurologic: Alert and oriented x 3.  Psych: Normal affect. Extremities: No clubbing or cyanosis.  HEENT: Normal.   VT has subsided on amiodarone gtt.  Cath with no significant CAD.  cMRI with LV EF 40% and inferolateral subendocardial LGE pattern that looks like prior MI despite unremarkable cath.  I suspect the inferolateral scar is the nidus for VT.  - Continue amiodarone gtt today, if no further VT, she can switch to po and go home tomorrow.  - Discussed ICD with EP, she is in a gray zone.  Plan will be amiodarone for now, ICD if recurrent VT.   Volume status looks ok.  Continue current po regimen except will add back Entresto 24/26 bid.     Will make followup.   [name] 09/10/2020 11:40 AM

## 2020-09-10 NOTE — Progress Notes (Signed)
Pt is wearing home CPAP qhs

## 2020-09-10 NOTE — Progress Notes (Addendum)
Electrophysiology Rounding Note  Patient Name: Heidi Stark Date of Encounter: 09/10/2020  Primary Cardiologist: No primary care provider on file. Electrophysiologist: Margarite Vessel [name], MD   Subjective   "I haven't had any more episodes"  Inpatient Medications    Scheduled Meds: . amiodarone  400 mg Oral BID  . apixaban  5 mg Oral BID  . dapagliflozin propanediol  5 mg Oral BID AC  . fenofibrate  160 mg Oral Daily  . furosemide  60 mg Oral BID  . icosapent Ethyl  2 g Oral BID  . insulin glargine  15 Units Subcutaneous QHS  . metoprolol succinate  25 mg Oral BID  . potassium chloride  20 mEq Oral BID  . rosuvastatin  10 mg Oral Daily  . sacubitril-valsartan  1 tablet Oral BID  . sodium chloride flush  3 mL Intravenous Q12H  . sodium chloride flush  3 mL Intravenous Q12H  . sodium chloride flush  3 mL Intravenous Q12H   Continuous Infusions: . sodium chloride    . sodium chloride     PRN Meds: sodium chloride, sodium chloride, acetaminophen, ondansetron (ZOFRAN) IV, sodium chloride flush, sodium chloride flush, tobramycin-dexamethasone   Vital Signs    Vitals:   09/10/20 0600 09/10/20 0738 09/10/20 0824 09/10/20 1340  BP:  100/71 102/82   Pulse:  82 74 80  Resp:  18    Temp:  98.1 F (36.7 C)  98.5 F (36.9 C)  TempSrc:  Oral  Oral  SpO2:  92%    Weight: 114.9 kg     Height:        Intake/Output Summary (Last 24 hours) at 09/10/2020 1529 Last data filed at 09/10/2020 0130 Gross per 24 hour  Intake 240 ml  Output 600 ml  Net -360 ml   Filed Weights   09/07/20 0935 09/09/20 0309 09/10/20 0600  Weight: 113.4 kg 115.1 kg 114.9 kg    Physical Exam    GEN- The patient is well appearing, alert and oriented x 3 today.   Head- normocephalic, atraumatic Eyes-  Sclera clear, conjunctiva pink Ears- hearing intact Oropharynx- clear Neck- supple Lungs- Clear to ausculation bilaterally, normal work of breathing Heart- Regular rate and rhythm, no  murmurs, rubs or gallops GI- soft, NT, ND, + BS Extremities- no clubbing or cyanosis. No edema Skin- no rash or lesion Psych- euthymic mood, full affect Neuro- strength and sensation are intact  Labs    CBC No results for input(s): WBC, NEUTROABS, HGB, HCT, MCV, PLT in the last 72 hours. Basic Metabolic Panel Recent Labs    09/08/20 2139 09/09/20 0236 09/09/20 1141 09/10/20 0132  NA 136 138  --  137  K 3.4* 4.3  --  3.6  CL 99 101  --  100  CO2 25 27  --  26  GLUCOSE 168* 161*  --  177*  BUN 16 14  --  12  CREATININE 0.79 0.90  --  0.95  CALCIUM 8.0* 8.1*  --  8.3*  MG 2.1  --  2.2  --    Liver Function Tests No results for input(s): AST, ALT, ALKPHOS, BILITOT, PROT, ALBUMIN in the last 72 hours. No results for input(s): LIPASE, AMYLASE in the last 72 hours. Cardiac Enzymes No results for input(s): CKTOTAL, CKMB, CKMBINDEX, TROPONINI in the last 72 hours.   Telemetry    NSR, 4-5 beat episode of NSVT x 1, no further sustained VT (personally reviewed)  Radiology    MR  CARDIAC MORPHOLOGY W WO CONTRAST  Result Date: 09/09/2020 CLINICAL DATA:  Ventricular tachycardia EXAM: CARDIAC MRI TECHNIQUE: The patient was scanned on a 1.5 Tesla GE magnet. A dedicated cardiac coil was used. Functional imaging was done using Fiesta sequences. 2,3, and 4 chamber views were done to assess for RWMA's. Modified Simpson's rule using a short axis stack was used to calculate an ejection fraction on a dedicated work Conservation officer, nature. The patient received 8 cc of Gadavist. After 10 minutes inversion recovery sequences were used to assess for infiltration and scar tissue. FINDINGS: Limited images of the lung fields showed tiny bilateral effusions. Normal left ventricular size and wall thickness. The basal to mid inferolateral wall is thin and akinetic. LV EF 40%. Normal right ventricular size with mildly decreased systolic function, EF 42%. Mildly dilated right atrium, moderately dilated  left atrium. Mild mitral regurgitation. Trileaflet aortic valve with trivial regurgitation and no stenosis. Delayed enhancement imaging: Mid-wall/subepicardial late gadolinium enhancement (LGE) in the basal anteroseptal wall. >50% wall thickness subendocardial LGE in the basal to mid inferolateral wall. Measurements: LVEDV 195 mL LVSV 79 mL LVEF 40% RVEDV 179 mL RVSV 68 mL RVEF 38% IMPRESSION: 1. Normal LV size with thin/akinetic basal to mid inferolateral wall, EF 40%. 2.  Normal RV size with mildly decreased systolic function, EF 35%. 3. Delayed enhancement imaging showed subendocardial LGE in the basal to mid inferolateral wall. This appears to be a coronary disease pattern, but no CAD was seen on recent cath. Technically difficult study due to difficulty with breath-holding. Dalton Mclean Electronically Signed   By: [name] M.D.   On: 09/09/2020 23:17    Patient Profile     Heidi Stark is a 80 y.o. female with a history of Afib and flutter, Chronic anticoagulation on Eliquis 5 mg BID, CHA2DS2VASC of at least 9, chronic systolic CHF, NICM (by cardiac CT), Calcium score 82 percentile but no invasive ischemic work up, COPD, and OSA who presented to [facility] via EMS with periods of lightheadedness and near syncope x 1 week.   Assessment & Plan    1.  VT No further.  Continue to load IV amiodarone -> IV infiltrated Cailey Trigueros transition to po 400 mg BID.  cMRI with mild scar but no CAD on cath.  Keep K > 4.0 and Mg > 2.0  Echo 2/22 with LVEF 50-55%   2. Persistent AF ( s/p AF ablation 2018) / Atypical flutter Overall had been doing well s/p AF ablation 2018 on tikosyn.  Had Flutter in December and underwent [facility].  She remains in flutter. suspect in setting of acute episodes over the past week.  Back on Eliquis, but held 2 dose for cath. Eriko Economos need to time eventual [facility] accordingly (or consider TEE).    3. Chronic systolic CHF with previously normalized EF EF previously normalized by Echo 12/2017.  (30-35% in 07/2016) Continue GDMT as BP tolerates.    4. COPD Encouraged smoking cessation.  Monitor closely on amio. ? PFTs   5. CAD Coronary CTA showed "non-obstructive" CAD with Ca score in 82nd percentile.  [place] 09/08/20 with no angiographically significant disease.  cMRI as above  For questions or updates, please contact [place] Please consult [email or web address] for contact info under Cardiology/STEMI.  Signed, Shirley Friar, PA-C  09/10/2020, 3:29 PM   I have seen and examined this patient with Oda Kilts.  Agree with above, note added to reflect my findings.  On exam, RRR, no murmurs, lungs clear.  No further prolonged VT overnight. CMRI with scar in CAD pattern but no obstructive CAD on cath. Sophie Tamez switch to PO amiodarone and likely discharge tomorrow on amiodarone load with follow up in clinic. Gilford Lardizabal replete K.  Kelsee Preslar M. Tesean Stump MD 09/10/2020 10:01 PM

## 2020-09-11 LAB — BASIC METABOLIC PANEL
Anion gap: 9 (ref 5–15)
BUN: 13 mg/dL (ref 8–23)
CO2: 29 mmol/L (ref 22–32)
Calcium: 8.6 mg/dL — ABNORMAL LOW (ref 8.9–10.3)
Chloride: 98 mmol/L (ref 98–111)
Creatinine, Ser: 1.01 mg/dL — ABNORMAL HIGH (ref 0.44–1.00)
GFR, Estimated: 57 mL/min — ABNORMAL LOW (ref >60)
Glucose, Bld: 165 mg/dL — ABNORMAL HIGH (ref 70–99)
Potassium: 3.9 mmol/L (ref 3.5–5.1)
Sodium: 136 mmol/L (ref 135–145)

## 2020-09-11 MED ORDER — TOBRAMYCIN-DEXAMETHASONE 0.3-0.1 % OP SUSP: 1.0000 [drp] | Freq: Every day | OPHTHALMIC | Status: DC | PRN

## 2020-09-11 MED ORDER — METOPROLOL SUCCINATE ER 25 MG PO TB24: 25.0000 mg | ORAL_TABLET | Freq: Every day | ORAL | 2 refills | Status: DC

## 2020-09-11 MED ORDER — SACUBITRIL-VALSARTAN 24-26 MG PO TABS: 1.0000 | ORAL_TABLET | Freq: Two times a day (BID) | ORAL | 2 refills | Status: DC

## 2020-09-11 MED ORDER — AMIODARONE HCL 400 MG PO TABS: ORAL_TABLET | ORAL | 2 refills | Status: DC

## 2020-09-11 NOTE — Discharge Instructions (Signed)
**   NO DRIVING FOR 6 MONTHS**  Medication Changes: - STOP Tikosyn. - START Amiodarone 400mg  twice daily for 2 weeks. Then on 09/24/2020, decrease to 400mg  daily. - DECREASE Entresto to 24-26mg  twice daily. - DECREASE Toprol-XL to 25mg  only once daily at night. - CONTINUE Lasix 60mg  twice daily. - CONTINUE Eliquis 5mg  twice daily. - STOP Eplerenone for now. May be able to restart as outpatient.  Continue to take all other medications as described elsewhere on discharge paperwork.  Heart Failure Education: 1. Weigh yourself EVERY morning after you go to the bathroom but before you eat or drink anything. Write this number down in a weight log/diary. If you gain 3 pounds overnight or 5 pounds in a week, call the office. 2. Take your medicines as prescribed. If you have concerns about your medications, please call us before you stop taking them.  3. Eat low salt foods--Limit salt (sodium) to 2000 mg per day. This will help prevent your body from holding onto fluid. Read food labels as many processed foods have a lot of sodium, especially canned goods and prepackaged meats. If you would like some assistance choosing low sodium foods, we would be happy to set you up with a nutritionist. 4. Limit all fluids for the day to less than 2 liters (64 ounces). Fluid includes all drinks, coffee, juice, ice chips, soup, jello, and all other liquids. 5. Stay as active as you can everyday. Staying active will give you more energy and make your muscles stronger. Start with 5 minutes at a time and work your way up to 30 minutes a day. Break up your activities--do some in the morning and some in the afternoon. Start with 3 days per week and work your way up to 5 days as you can.  If you have chest pain, feel short of breath, dizzy, or lightheaded, STOP. If you don't feel better after a short rest, call 911. If you do feel better, call the office to let us know you have symptoms with exercise.

## 2020-09-11 NOTE — Progress Notes (Signed)
Electrophysiology Rounding Note  Patient Name: Heidi Stark Date of Encounter: 09/11/2020  Primary Cardiologist: No primary care provider on file. Electrophysiologist: [name], MD   Subjective   Feels good  no nausea Breathing fine    Inpatient Medications    Scheduled Meds: . amiodarone  400 mg Oral BID  . apixaban  5 mg Oral BID  . dapagliflozin propanediol  5 mg Oral BID AC  . fenofibrate  160 mg Oral Daily  . furosemide  60 mg Oral BID  . icosapent Ethyl  2 g Oral BID  . insulin glargine  15 Units Subcutaneous QHS  . metoprolol succinate  25 mg Oral BID  . potassium chloride  20 mEq Oral BID  . rosuvastatin  10 mg Oral Daily  . sacubitril-valsartan  1 tablet Oral BID  . sodium chloride flush  3 mL Intravenous Q12H  . sodium chloride flush  3 mL Intravenous Q12H  . sodium chloride flush  3 mL Intravenous Q12H   Continuous Infusions: . sodium chloride    . sodium chloride     PRN Meds: sodium chloride, sodium chloride, acetaminophen, ondansetron (ZOFRAN) IV, sodium chloride flush, sodium chloride flush, tobramycin-dexamethasone   Vital Signs    Vitals:   09/10/20 2017 09/11/20 0436 09/11/20 0847 09/11/20 0941  BP: 105/63 (!) 103/44 (!) 84/61 97/61  Pulse: 80 80 82 84  Resp: 19 19 14 18   Temp: 98.9 F (37.2 C) 98.9 F (37.2 C) 98.3 F (36.8 C)   TempSrc: Oral Oral Oral   SpO2: 97% 98% 92% 93%  Weight:  114.8 kg    Height:       No intake or output data in the 24 hours ending 09/11/20 1033 Filed Weights   09/09/20 0309 09/10/20 0600 09/11/20 0436  Weight: 115.1 kg 114.9 kg 114.8 kg    Physical Exam  Well developed and nourished in no acute distress HENT normal Neck supple wit  Clear Regular rate and rhythm, no murmurs or gallops Abd-soft with active BS No Clubbing cyanosis edema Skin-warm and dry A & Oriented  Grossly normal sensory and motor function     Labs    CBC No results for input(s): WBC, NEUTROABS, HGB, HCT, MCV,  PLT in the last 72 hours. Basic Metabolic Panel Recent Labs    09/08/20 2139 09/09/20 0236 09/09/20 1141 09/10/20 0132 09/11/20 0235  NA 136   < >  --  137 136  K 3.4*   < >  --  3.6 3.9  CL 99   < >  --  100 98  CO2 25   < >  --  26 29  GLUCOSE 168*   < >  --  177* 165*  BUN 16   < >  --  12 13  CREATININE 0.79   < >  --  0.95 1.01*  CALCIUM 8.0*   < >  --  8.3* 8.6*  MG 2.1  --  2.2  --   --    < > = values in this interval not displayed.   Liver Function Tests No results for input(s): AST, ALT, ALKPHOS, BILITOT, PROT, ALBUMIN in the last 72 hours. No results for input(s): LIPASE, AMYLASE in the last 72 hours. Cardiac Enzymes No results for input(s): CKTOTAL, CKMB, CKMBINDEX, TROPONINI in the last 72 hours.   Telemetry    Non vt  Radiology    MR CARDIAC MORPHOLOGY W WO CONTRAST  Result Date: 09/09/2020 CLINICAL  DATA:  Ventricular tachycardia EXAM: CARDIAC MRI TECHNIQUE: The patient was scanned on a 1.5 Tesla GE magnet. A dedicated cardiac coil was used. Functional imaging was done using Fiesta sequences. 2,3, and 4 chamber views were done to assess for RWMA's. Modified Simpson's rule using a short axis stack was used to calculate an ejection fraction on a dedicated work Conservation officer, nature. The patient received 8 cc of Gadavist. After 10 minutes inversion recovery sequences were used to assess for infiltration and scar tissue. FINDINGS: Limited images of the lung fields showed tiny bilateral effusions. Normal left ventricular size and wall thickness. The basal to mid inferolateral wall is thin and akinetic. LV EF 40%. Normal right ventricular size with mildly decreased systolic function, EF 43%. Mildly dilated right atrium, moderately dilated left atrium. Mild mitral regurgitation. Trileaflet aortic valve with trivial regurgitation and no stenosis. Delayed enhancement imaging: Mid-wall/subepicardial late gadolinium enhancement (LGE) in the basal anteroseptal wall. >50%  wall thickness subendocardial LGE in the basal to mid inferolateral wall. Measurements: LVEDV 195 mL LVSV 79 mL LVEF 40% RVEDV 179 mL RVSV 68 mL RVEF 38% IMPRESSION: 1. Normal LV size with thin/akinetic basal to mid inferolateral wall, EF 40%. 2.  Normal RV size with mildly decreased systolic function, EF 32%. 3. Delayed enhancement imaging showed subendocardial LGE in the basal to mid inferolateral wall. This appears to be a coronary disease pattern, but no CAD was seen on recent cath. Technically difficult study due to difficulty with breath-holding. Dalton Mclean Electronically Signed   By: [name] M.D.   On: 09/09/2020 23:17    Patient Profile     Heidi Stark is a 80 y.o. female with a history of Afib and flutter, Chronic anticoagulation on Eliquis 5 mg BID, CHA2DS2VASC of at least 9, chronic systolic CHF, NICM (by cardiac CT), Calcium score 82 percentile but no invasive ischemic work up, COPD, and OSA who presented to [facility] via EMS with periods of lightheadedness and near syncope x 1 week.   Assessment & Plan    Lightheadedness with monitor>> FAST  VT   2. Persistent AF ( s/p AF ablation 2018) / Atypical flutter p[ersisting   .  3. Chronic systolic CHF with previously normalized EF and now worsened again  Cardiomyopathy-- scarring with LGE- pattern suggestive of CAD but without CAD at cath EF  -40%   4. COPD     low BP and entresto and metoprolol held Will have her take it at night and discharge on the lower dose of entresto   No driving x 6 month   Continue meds otherwise  Needs DCCV in about 3 weeks for aflutter  Virl Axe MD 09/11/2020 10:33 AM

## 2020-09-11 NOTE — Care Management (Signed)
Pt discharging home on Entresto; free 30 day trial card given to patient.  No other dc needs identified.    Reinaldo Raddle, RN, BSN  Trauma/Neuro ICU Case Manager [phone]

## 2020-09-11 NOTE — Discharge Summary (Signed)
Discharge Summary    Patient ID: Heidi Stark MRN: [id number]; DOB: 10/22/1940  Admit date: 09/07/2020 Discharge date: 09/11/2020  PCP:  Lowne Chase, [facility]  Cardiologist:  [name], MD Electrophysiologist:  Constance Haw, MD  [phone]    Discharge Diagnoses    Active Problems:   Wide-complex tachycardia [facility])   Ventricular tachyarrhythmia [facility])   VT (ventricular tachycardia) [facility])    Diagnostic Studies/Procedures    Echocardiogram 09/07/2020: Impressions: 1. Left ventricular ejection fraction, by estimation, is 50 to 55%. The  left ventricle has low normal function. The left ventricle has no regional  wall motion abnormalities. Left ventricular diastolic parameters are  indeterminate.  2. Right ventricular systolic function is normal. The right ventricular  size is normal. There is normal pulmonary artery systolic pressure.  3. Left atrial size was mild to moderately dilated.  4. Right atrial size was mildly dilated.  5. A small pericardial effusion is present.  6. The mitral valve is abnormal. Trivial mitral valve regurgitation.  Moderate mitral annular calcification.  7. The aortic valve is grossly normal. Aortic valve regurgitation is not  visualized. No aortic stenosis is present.  8. The inferior vena cava is normal in size with greater than 50%  respiratory variability, suggesting right atrial pressure of 3 mmHg.   Comparison(s): No significant change from prior study. _______________  Left Cardiac Catheterization 09/08/2020: Conclusion No significant coronary disease noted.  _____________  Cardiac MRI 09/09/2020: Impression: 1. Normal LV size with thin/akinetic basal to mid inferolateral wall, EF 40%.  2.  Normal RV size with mildly decreased systolic function, EF 79%.  3. Delayed enhancement imaging showed subendocardial LGE in the basal to mid inferolateral wall. This appears to be a  coronary disease pattern, but no CAD was seen on recent cath.  Technically difficult study due to difficulty with breath-holding.    History of Present Illness     Heidi Stark is a 80 y.o. 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 [name] and presented to [facility] ED on 09/07/2020 via EMS with intermittent lightheadedness and near syncope for 1 week.   Patient reported she was in a usual state of health until 1 week before presentation. She called our office on 09/05/2020 with complaints of near syncope and was recommended to follow-up in our office ASAP or to seek emergency care if it worsens. She did not feel any better on the morning of 09/07/2020 so she called 911. Upon EMS she was fully alert and oriented. EKG showed wide complex tachycardia with rates as high as 220 bpm. Valsalva maneuvers were attempted but were not successful. She spontaneously converted to normal sinus rhythm after a couple of minutes. However, she had recurrent wide complex tachycardia and was started on IV Amiodarone drip with bolus. She then remained in atrial flutter with controlled rates while en route to the ED.   In the ED, patient reported fatigue for 1 week with intermittent episodes of near syncope and lightheadedness. She denied any ventricular tachycardia in the past. Labs were unremarkable. She stated she had been compliant with her medications. She was admitted for further evaluation and management  of VT.    Hospital Course     Consultants: [facility]  VT  Patient admitted for VT. Tikosyn was stopped and patient was started on IV Amiodarone. Echo showed LVEF of 50-55% with normal wall  motion, trivial MR, and small pericardial effusion. [facility] team was consulted given history of CHF. Patient underwent left cardiac catheterization on 09/08/2020 which sowed no significant disease. Cardiac MRI showed normal LV size with with thin/akinetic basal to mid inferolateral wall with EF of 40%, normal RV size with mildly decreased systolic function and EF of 38%, and subendocardial LGE in the basal to mid inferolateral wall in coronary disease pattern but no CAD on cath. Will discharge on PO Amiodarone 400mg  twice daily for 2 weeks and then transition to 400mg  daily for at least 2 weeks. By then patient will have follow-up with EP long term dose can be discussed. Patient advised no driving for 6 months.  Persistent Atrial Fibrillation/Flutter  Patient was found to be in atrial flutter in 06/2020 and underwent. She was back in atrial flutter on presentation. Rates controlled. Tikosyn stopped due to VT and started on Amiodarone as above. Contact Eliquis 5mg  twice daily. Will need outpatient DCCV after 3 weeks of uninterrupted anticoagulation.   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 Echo this admission showed LVEF of 50-55%. Cardiac MRI showed LVEF of 40% and RVEF of 38% with inferolateral subendocardial LGE. Continue Lasix 60mg  daily twice daily. BP has been soft so Entresto decreased to 24-26mg  twice daily and Toprol-XL 25 decreased from twice daily to once daily at night. twice daily. Will hold Eplerenone for now - may be able to be restarted as outpatient. Continue Farxiga. Follow-up with [facility] team has been arranged.  Non-Obstructive CAD  Coronary CA in 2018 showed coronary calcium score of 335 Agatston units (82nd percentile for age and gender) with mild CAD in all three major vessels. Cardiac catheterization this admission showed only luminal irregularities. Not on Aspirin due to need for Eliquis. Continue  beta-blocker and Livalo/Vascepa.   COPD  Monitor closely on Amiodarone. Consider PFTs as outpatient.   Obstructive Sleep Apnea  Continue CPAP   Patient seen and examined by Dr. Caryl Comes today and determined to be stable for discharge. Will arrange outpatient follow-up. Medications as below.    Did the patient have an acute coronary syndrome (MI, NSTEMI, STEMI, etc) this admission?:  No                               Did the patient have a percutaneous coronary intervention (stent / angioplasty)?:  No.       _____________  Discharge Vitals Blood pressure 97/61, pulse 84, temperature 98.3 F (36.8 C), temperature source Oral, resp. rate 18, height 5\' 6"  (1.676 m), weight 114.8 kg, SpO2 93 %.  Filed Weights   09/09/20 0309 09/10/20 0600 09/11/20 0436  Weight: 115.1 kg 114.9 kg 114.8 kg    Labs & Radiologic Studies    CBC No results for input(s): WBC, NEUTROABS, HGB, HCT, MCV, PLT in the last 72 hours. Basic Metabolic Panel Recent Labs    09/08/20 2139 09/09/20 0236 09/09/20 1141 09/10/20 0132 09/11/20 0235  NA 136   < >  --  137 136  K 3.4*   < >  --  3.6 3.9  CL 99   < >  --  100 98  CO2 25   < >  --  26 29  GLUCOSE 168*   < >  --  177* 165*  BUN 16   < >  --  12 13  CREATININE 0.79   < >  --  0.95 1.01*  CALCIUM 8.0*   < >  --  8.3* 8.6*  MG 2.1  --  2.2  --   --    < > = values in this interval not displayed.   Liver Function Tests No results for input(s): AST, ALT, ALKPHOS, BILITOT, PROT, ALBUMIN in the last 72 hours. No results for input(s): LIPASE, AMYLASE in the last 72 hours. High Sensitivity Troponin:   Recent Labs  Lab 09/07/20 1004 09/07/20 1210  TROPONINIHS 13 12    BNP Invalid input(s): POCBNP D-Dimer No results for input(s): DDIMER in the last 72 hours. Hemoglobin A1C No results for input(s): HGBA1C in the last 72 hours. Fasting Lipid Panel No results for input(s): CHOL, HDL, LDLCALC, TRIG, CHOLHDL, LDLDIRECT in the last 72 hours. Thyroid  Function Tests No results for input(s): TSH, T4TOTAL, T3FREE, THYROIDAB in the last 72 hours.  Invalid input(s): FREET3 _____________  CARDIAC CATHETERIZATION  Result Date: 09/08/2020 No significant coronary disease noted.   DG Chest Port 1 View  Result Date: 09/07/2020 CLINICAL DATA:  Near syncope. EXAM: PORTABLE CHEST 1 VIEW COMPARISON:  08/09/2016. FINDINGS: Cardiomegaly. Low lung volumes. No focal infiltrate. No pleural effusion or pneumothorax. IMPRESSION: Cardiomegaly. No acute pulmonary disease. Electronically Signed   By: Marcello Moores  Register   On: 09/07/2020 10:19   MR CARDIAC MORPHOLOGY W WO CONTRAST  Result Date: 09/09/2020 CLINICAL DATA:  Ventricular tachycardia EXAM: CARDIAC MRI TECHNIQUE: The patient was scanned on a 1.5 Tesla GE magnet. A dedicated cardiac coil was used. Functional imaging was done using Fiesta sequences. 2,3, and 4 chamber views were done to assess for RWMA's. Modified Simpson's rule using a short axis stack was used to calculate an ejection fraction on a dedicated work Conservation officer, nature. The patient received 8 cc of Gadavist. After 10 minutes inversion recovery sequences were used to assess for infiltration and scar tissue. FINDINGS: Limited images of the lung fields showed tiny bilateral effusions. Normal left ventricular size and wall thickness. The basal to mid inferolateral wall is thin and akinetic. LV EF 40%. Normal right ventricular size with mildly decreased systolic function, EF 72%. Mildly dilated right atrium, moderately dilated left atrium. Mild mitral regurgitation. Trileaflet aortic valve with trivial regurgitation and no stenosis. Delayed enhancement imaging: Mid-wall/subepicardial late gadolinium enhancement (LGE) in the basal anteroseptal wall. >50% wall thickness subendocardial LGE in the basal to mid inferolateral wall. Measurements: LVEDV 195 mL LVSV 79 mL LVEF 40% RVEDV 179 mL RVSV 68 mL RVEF 38% IMPRESSION: 1. Normal LV size with  thin/akinetic basal to mid inferolateral wall, EF 40%. 2.  Normal RV size with mildly decreased systolic function, EF 09%. 3. Delayed enhancement imaging showed subendocardial LGE in the basal to mid inferolateral wall. This appears to be a coronary disease pattern, but no CAD was seen on recent cath. Technically difficult study due to difficulty with breath-holding. Dalton Mclean Electronically Signed   By: [name] M.D.   On: 09/09/2020 23:17   ECHOCARDIOGRAM COMPLETE  Result Date: 09/07/2020    ECHOCARDIOGRAM REPORT   Patient Name:   DEAVION STRIDER Date of Exam: 09/07/2020 Medical Rec #:  [id number]      Height:       66.0 in Accession #:  [id number]     Weight:       250.0 lb Date of Birth:  Oct 23, 1940      BSA:          2.199 m Patient Age:    80 years       BP:           104/60 mmHg Patient Gender: F              HR:           89 bpm. Exam Location:  Inpatient Procedure: 2D Echo, Cardiac Doppler and Color Doppler Indications:    I47.2 Ventricular tachycardia  History:        Patient has prior history of Echocardiogram examinations, most                 recent 09/07/2020. CHF, COPD, Arrythmias:Atrial Fibrillation,                 Signs/Symptoms:Dyspnea; Risk Factors:Hypertension, Diabetes,                 Dyslipidemia and Sleep Apnea.  Sonographer:    Tiffany Dance Referring Phys: [id number] [place]  1. Left ventricular ejection fraction, by estimation, is 50 to 55%. The left ventricle has low normal function. The left ventricle has no regional wall motion abnormalities. Left ventricular diastolic parameters are indeterminate.  2. Right ventricular systolic function is normal. The right ventricular size is normal. There is normal pulmonary artery systolic pressure.  3. Left atrial size was mild to moderately dilated.  4. Right atrial size was mildly dilated.  5. A small pericardial effusion is present.  6. The mitral valve is abnormal. Trivial mitral valve regurgitation.  Moderate mitral annular calcification.  7. The aortic valve is grossly normal. Aortic valve regurgitation is not visualized. No aortic stenosis is present.  8. The inferior vena cava is normal in size with greater than 50% respiratory variability, suggesting right atrial pressure of 3 mmHg. Comparison(s): No significant change from prior study. FINDINGS  Left Ventricle: Left ventricular ejection fraction, by estimation, is 50 to 55%. The left ventricle has low normal function. The left ventricle has no regional wall motion abnormalities. The left ventricular internal cavity size was normal in size. There is borderline left ventricular hypertrophy. Left ventricular diastolic parameters are indeterminate. Right Ventricle: The right ventricular size is normal. Right vetricular wall thickness was not well visualized. Right ventricular systolic function is normal. There is normal pulmonary artery systolic pressure. The tricuspid regurgitant velocity is 2.43 m/s, and with an assumed right atrial pressure of 3 mmHg, the estimated right ventricular systolic pressure is 29.5 mmHg. Left Atrium: Left atrial size was mild to moderately dilated. Right Atrium: Right atrial size was mildly dilated. Pericardium: A small pericardial effusion is present. Presence of pericardial fat pad. Mitral Valve: The mitral valve is abnormal. Moderate mitral annular calcification. Trivial mitral valve regurgitation. Tricuspid Valve: The tricuspid valve is normal in structure. Tricuspid valve regurgitation is trivial. Aortic Valve: The aortic valve is grossly normal. There is mild to moderate aortic valve annular calcification. Aortic valve regurgitation is not visualized. No aortic stenosis is present. Pulmonic Valve: The pulmonic valve was not well visualized. Pulmonic valve regurgitation is not visualized. Aorta: The aortic root, ascending aorta and aortic arch are all structurally normal, with no evidence of dilitation or obstruction. Venous:  The inferior vena cava is normal in size with greater than 50% respiratory variability, suggesting right atrial pressure of 3  mmHg. IAS/Shunts: The atrial septum is grossly normal.  LEFT VENTRICLE PLAX 2D LVIDd:         5.00 cm  Diastology LVIDs:         4.20 cm  LV e' medial:    7.40 cm/s LV PW:         1.40 cm  LV E/e' medial:  16.5 LV IVS:        1.00 cm  LV e' lateral:   10.30 cm/s LVOT diam:     1.90 cm  LV E/e' lateral: 11.8 LV SV:         72 LV SV Index:   33 LVOT Area:     2.84 cm  RIGHT VENTRICLE             IVC RV Basal diam:  3.10 cm     IVC diam: 1.70 cm RV Mid diam:    2.30 cm RV S prime:     12.00 cm/s TAPSE (M-mode): 1.9 cm LEFT ATRIUM             Index       RIGHT ATRIUM           Index LA diam:        5.20 cm 2.36 cm/m  RA Area:     17.90 cm LA Vol (A2C):   56.9 ml 25.88 ml/m RA Volume:   47.00 ml  21.38 ml/m LA Vol (A4C):   72.1 ml 32.79 ml/m LA Biplane Vol: 65.1 ml 29.61 ml/m  AORTIC VALVE LVOT Vmax:   129.00 cm/s LVOT Vmean:  99.000 cm/s LVOT VTI:    0.253 m  AORTA Ao Root diam: 3.10 cm Ao Asc diam:  3.30 cm MITRAL VALVE                TRICUSPID VALVE MV Area (PHT): 4.18 cm     TR Peak grad:   23.6 mmHg MV Decel Time: 182 msec     TR Vmax:        243.00 cm/s MV E velocity: 122.00 cm/s                             SHUNTS                             Systemic VTI:  0.25 m                             Systemic Diam: 1.90 cm Buford Dresser MD Electronically signed by Buford Dresser MD Signature Date/Time: 09/07/2020/7:50:04 PM    Final    Disposition   Patient is being discharged home today in good condition.  Follow-up Plans & Appointments     Follow-up Information    [facility] Follow up on 09/22/2020.   Specialty: Cardiology Why: 10:30 AM Dr. Claris Gladden office  Contact information: [address] [id number] [place] [address] [phone]       Constance Haw, MD Follow up.   Specialty:  Cardiology Why: Our office will call you to arrange follow-up visit. If you do not hear from [name] within 2 business days, please call our office to schedule this. Contact information: [address] [place] [address] [phone]  Discharge Medications   Allergies as of 09/11/2020      Reactions   Neomycin-bacitracin Zn-polymyx Rash   Sulfonamide Derivatives Other (See Comments)   Stomach cramps   Ciprofloxacin Hives, Itching, Rash   Spironolactone Rash      Medication List    STOP taking these medications   dofetilide 125 MCG capsule Commonly known as: TIKOSYN   Entresto 49-51 MG Generic drug: sacubitril-valsartan Replaced by: sacubitril-valsartan 24-26 MG   eplerenone 25 MG tablet Commonly known as: INSPRA     TAKE these medications   acetaminophen 500 MG tablet Commonly known as: TYLENOL Take 500 mg by mouth every 6 (six) hours as needed for moderate pain or headache.   amiodarone 400 MG tablet Commonly known as: PACERONE Take 400mg  twice daily for 2 weeks. Then on 09/24/2020, transition to 400mg  daily.   BD Pen Needle Nano U/F 32G X 4 MM Misc Generic drug: Insulin Pen Needle See admin instructions.   Eliquis 5 MG Tabs tablet Generic drug: apixaban TAKE 1 TABLET BY MOUTH TWICE A DAY What changed: how much to take   fenofibrate 145 MG tablet Commonly known as: TRICOR TAKE 1 TABLET BY MOUTH EVERY DAY What changed: when to take this   furosemide 40 MG tablet Commonly known as: LASIX Take 1.5 tablets (60 mg total) by mouth 2 (two) times daily.   insulin glargine 100 UNIT/ML injection Commonly known as: LANTUS Inject 15 Units into the skin at bedtime.   Livalo 4 MG Tabs Generic drug: Pitavastatin Calcium TAKE 1 TABLET BY MOUTH EVERY DAY What changed: how much to take   metoprolol succinate 25 MG 24 hr tablet Commonly known as: TOPROL-XL Take 1 tablet (25 mg total) by mouth at bedtime. What changed: when to take  this   multivitamin with minerals Tabs tablet Take 1 tablet by mouth daily.   potassium chloride 10 MEQ tablet Commonly known as: Klor-Con M10 Take 2 tablets (20 mEq total) by mouth 2 (two) times daily.   sacubitril-valsartan 24-26 MG Commonly known as: ENTRESTO Take 1 tablet by mouth 2 (two) times daily. Replaces: Entresto 49-51 MG   tobramycin-dexamethasone ophthalmic solution Commonly known as: TOBRADEX Place 1 drop into both eyes daily as needed (redness).   Vascepa 1 g capsule Generic drug: icosapent Ethyl TAKE 2 CAPSULES BY MOUTH TWICE A DAY What changed: how much to take   Xigduo XR 11-998 MG Tb24 Generic drug: Dapagliflozin-metFORMIN HCl ER Take 1 tablet by mouth in the morning and at bedtime.          Outstanding Labs/Studies   Can repeat BMET at follow-up visit.  Duration of Discharge Encounter   Greater than 30 minutes including physician time.  Signed, Darreld Mclean, PA-C 09/11/2020, 12:31 PM

## 2020-09-12 ENCOUNTER — Telehealth: Payer: Self-pay | Admitting: Physician Assistant

## 2020-09-12 ENCOUNTER — Emergency Department (HOSPITAL_COMMUNITY): Payer: Medicare Other

## 2020-09-12 ENCOUNTER — Inpatient Hospital Stay (HOSPITAL_COMMUNITY)
Admission: EM | Admit: 2020-09-12 | Discharge: 2020-09-16 | DRG: 227 | Disposition: A | Discharge status: Home / Self Care | Payer: Medicare Other | Attending: Cardiology | Admitting: Cardiology

## 2020-09-12 ENCOUNTER — Encounter (HOSPITAL_COMMUNITY): Payer: Self-pay

## 2020-09-12 ENCOUNTER — Other Ambulatory Visit: Payer: Self-pay

## 2020-09-12 DIAGNOSIS — Z8249 Family history of ischemic heart disease and other diseases of the circulatory system: Secondary | ICD-10-CM

## 2020-09-12 DIAGNOSIS — I5020 Unspecified systolic (congestive) heart failure: Secondary | ICD-10-CM

## 2020-09-12 DIAGNOSIS — Z808 Family history of malignant neoplasm of other organs or systems: Secondary | ICD-10-CM

## 2020-09-12 DIAGNOSIS — Z9842 Cataract extraction status, left eye: Secondary | ICD-10-CM

## 2020-09-12 DIAGNOSIS — I110 Hypertensive heart disease with heart failure: Secondary | ICD-10-CM | POA: Diagnosis present

## 2020-09-12 DIAGNOSIS — I484 Atypical atrial flutter: Secondary | ICD-10-CM | POA: Diagnosis present

## 2020-09-12 DIAGNOSIS — E1159 Type 2 diabetes mellitus with other circulatory complications: Secondary | ICD-10-CM

## 2020-09-12 DIAGNOSIS — M545 Low back pain, unspecified: Secondary | ICD-10-CM | POA: Diagnosis present

## 2020-09-12 DIAGNOSIS — Z87442 Personal history of urinary calculi: Secondary | ICD-10-CM

## 2020-09-12 DIAGNOSIS — E785 Hyperlipidemia, unspecified: Secondary | ICD-10-CM | POA: Diagnosis present

## 2020-09-12 DIAGNOSIS — Z7901 Long term (current) use of anticoagulants: Secondary | ICD-10-CM

## 2020-09-12 DIAGNOSIS — Z833 Family history of diabetes mellitus: Secondary | ICD-10-CM

## 2020-09-12 DIAGNOSIS — E876 Hypokalemia: Secondary | ICD-10-CM

## 2020-09-12 DIAGNOSIS — Z8679 Personal history of other diseases of the circulatory system: Secondary | ICD-10-CM

## 2020-09-12 DIAGNOSIS — Z87891 Personal history of nicotine dependence: Secondary | ICD-10-CM

## 2020-09-12 DIAGNOSIS — I472 Ventricular tachycardia, unspecified: Secondary | ICD-10-CM

## 2020-09-12 DIAGNOSIS — N179 Acute kidney failure, unspecified: Secondary | ICD-10-CM | POA: Diagnosis not present

## 2020-09-12 DIAGNOSIS — Z961 Presence of intraocular lens: Secondary | ICD-10-CM | POA: Diagnosis present

## 2020-09-12 DIAGNOSIS — Z882 Allergy status to sulfonamides status: Secondary | ICD-10-CM

## 2020-09-12 DIAGNOSIS — G4733 Obstructive sleep apnea (adult) (pediatric): Secondary | ICD-10-CM | POA: Diagnosis not present

## 2020-09-12 DIAGNOSIS — Z79899 Other long term (current) drug therapy: Secondary | ICD-10-CM

## 2020-09-12 DIAGNOSIS — Z881 Allergy status to other antibiotic agents status: Secondary | ICD-10-CM

## 2020-09-12 DIAGNOSIS — R55 Syncope and collapse: Secondary | ICD-10-CM

## 2020-09-12 DIAGNOSIS — Z95818 Presence of other cardiac implants and grafts: Secondary | ICD-10-CM

## 2020-09-12 DIAGNOSIS — I4819 Other persistent atrial fibrillation: Secondary | ICD-10-CM | POA: Diagnosis present

## 2020-09-12 DIAGNOSIS — I493 Ventricular premature depolarization: Secondary | ICD-10-CM | POA: Diagnosis present

## 2020-09-12 DIAGNOSIS — Z6841 Body Mass Index (BMI) 40.0 and over, adult: Secondary | ICD-10-CM

## 2020-09-12 DIAGNOSIS — Z20822 Contact with and (suspected) exposure to covid-19: Secondary | ICD-10-CM | POA: Diagnosis present

## 2020-09-12 DIAGNOSIS — I152 Hypertension secondary to endocrine disorders: Secondary | ICD-10-CM

## 2020-09-12 DIAGNOSIS — Z888 Allergy status to other drugs, medicaments and biological substances status: Secondary | ICD-10-CM

## 2020-09-12 DIAGNOSIS — R21 Rash and other nonspecific skin eruption: Secondary | ICD-10-CM | POA: Diagnosis not present

## 2020-09-12 DIAGNOSIS — I5023 Acute on chronic systolic (congestive) heart failure: Secondary | ICD-10-CM | POA: Diagnosis present

## 2020-09-12 DIAGNOSIS — I428 Other cardiomyopathies: Secondary | ICD-10-CM | POA: Diagnosis present

## 2020-09-12 DIAGNOSIS — Z7984 Long term (current) use of oral hypoglycemic drugs: Secondary | ICD-10-CM

## 2020-09-12 DIAGNOSIS — Z794 Long term (current) use of insulin: Secondary | ICD-10-CM

## 2020-09-12 DIAGNOSIS — E1151 Type 2 diabetes mellitus with diabetic peripheral angiopathy without gangrene: Secondary | ICD-10-CM | POA: Diagnosis present

## 2020-09-12 DIAGNOSIS — J449 Chronic obstructive pulmonary disease, unspecified: Secondary | ICD-10-CM

## 2020-09-12 DIAGNOSIS — I2510 Atherosclerotic heart disease of native coronary artery without angina pectoris: Secondary | ICD-10-CM

## 2020-09-12 DIAGNOSIS — I5042 Chronic combined systolic (congestive) and diastolic (congestive) heart failure: Secondary | ICD-10-CM | POA: Diagnosis present

## 2020-09-12 DIAGNOSIS — Z9841 Cataract extraction status, right eye: Secondary | ICD-10-CM

## 2020-09-12 DIAGNOSIS — Z86718 Personal history of other venous thrombosis and embolism: Secondary | ICD-10-CM

## 2020-09-12 LAB — CBC WITH DIFFERENTIAL/PLATELET
Abs Immature Granulocytes: 0.04 10*3/uL (ref 0.00–0.07)
Basophils Absolute: 0 10*3/uL (ref 0.0–0.1)
Basophils Relative: 0 %
Eosinophils Absolute: 0.1 10*3/uL (ref 0.0–0.5)
Eosinophils Relative: 1 %
HCT: 45.2 % (ref 36.0–46.0)
Hemoglobin: 14.9 g/dL (ref 12.0–15.0)
Immature Granulocytes: 1 %
Lymphocytes Relative: 22 %
Lymphs Abs: 1.9 10*3/uL (ref 0.7–4.0)
MCH: 30.8 pg (ref 26.0–34.0)
MCHC: 33 g/dL (ref 30.0–36.0)
MCV: 93.4 fL (ref 80.0–100.0)
Monocytes Absolute: 1 10*3/uL (ref 0.1–1.0)
Monocytes Relative: 11 %
Neutro Abs: 5.5 10*3/uL (ref 1.7–7.7)
Neutrophils Relative %: 65 %
Platelets: 298 10*3/uL (ref 150–400)
RBC: 4.84 MIL/uL (ref 3.87–5.11)
RDW: 15.1 % (ref 11.5–15.5)
WBC: 8.5 10*3/uL (ref 4.0–10.5)
nRBC: 0 % (ref 0.0–0.2)

## 2020-09-12 LAB — URINALYSIS, ROUTINE W REFLEX MICROSCOPIC
Bilirubin Urine: NEGATIVE
Glucose, UA: >=500 mg/dL — AB
Ketones, ur: NEGATIVE mg/dL
Nitrite: NEGATIVE
Protein, ur: NEGATIVE mg/dL
Specific Gravity, Urine: 1.015 (ref 1.005–1.030)
pH: 5.5 (ref 5.0–8.0)

## 2020-09-12 LAB — URINALYSIS, MICROSCOPIC (REFLEX): Bacteria, UA: NONE SEEN

## 2020-09-12 LAB — GLUCOSE, CAPILLARY
Glucose-Capillary: 184 mg/dL — ABNORMAL HIGH (ref 70–99)
Glucose-Capillary: 151 mg/dL — ABNORMAL HIGH (ref 70–99)

## 2020-09-12 LAB — TROPONIN I (HIGH SENSITIVITY)
Troponin I (High Sensitivity): 13 ng/L (ref <18)
Troponin I (High Sensitivity): 15 ng/L (ref <18)

## 2020-09-12 LAB — PROTIME-INR
INR: 1.2 (ref 0.8–1.2)
Prothrombin Time: 15 seconds (ref 11.4–15.2)

## 2020-09-12 LAB — BASIC METABOLIC PANEL
Anion gap: 14 (ref 5–15)
BUN: 18 mg/dL (ref 8–23)
CO2: 27 mmol/L (ref 22–32)
Calcium: 8.5 mg/dL — ABNORMAL LOW (ref 8.9–10.3)
Chloride: 99 mmol/L (ref 98–111)
Creatinine, Ser: 1.06 mg/dL — ABNORMAL HIGH (ref 0.44–1.00)
GFR, Estimated: 54 mL/min — ABNORMAL LOW (ref >60)
Glucose, Bld: 170 mg/dL — ABNORMAL HIGH (ref 70–99)
Potassium: 4.2 mmol/L (ref 3.5–5.1)
Sodium: 140 mmol/L (ref 135–145)

## 2020-09-12 LAB — HEMOGLOBIN A1C
Hgb A1c MFr Bld: 8.1 % — ABNORMAL HIGH (ref 4.8–5.6)
Mean Plasma Glucose: 185.77 mg/dL

## 2020-09-12 LAB — MAGNESIUM: Magnesium: 2.3 mg/dL (ref 1.7–2.4)

## 2020-09-12 LAB — RESP PANEL BY RT-PCR (FLU A&B, COVID) ARPGX2
Influenza A by PCR: NEGATIVE
Influenza B by PCR: NEGATIVE
SARS Coronavirus 2 by RT PCR: NEGATIVE

## 2020-09-12 MED ORDER — NITROGLYCERIN 0.4 MG SL SUBL: 0.4000 mg | SUBLINGUAL_TABLET | SUBLINGUAL | Status: DC | PRN

## 2020-09-12 MED ORDER — ACETAMINOPHEN 325 MG PO TABS: 650.0000 mg | ORAL_TABLET | ORAL | Status: DC | PRN

## 2020-09-12 MED ORDER — POTASSIUM CHLORIDE CRYS ER 20 MEQ PO TBCR
20.0000 meq | EXTENDED_RELEASE_TABLET | Freq: Two times a day (BID) | ORAL | Status: DC
  Administered 2020-09-12 – 2020-09-16 (×8): 20 meq via ORAL
  Filled 2020-09-12 (×8): qty 1

## 2020-09-12 MED ORDER — APIXABAN 5 MG PO TABS
5.0000 mg | ORAL_TABLET | Freq: Two times a day (BID) | ORAL | Status: DC
  Administered 2020-09-12 – 2020-09-13 (×3): 5 mg via ORAL
  Filled 2020-09-12 (×3): qty 1

## 2020-09-12 MED ORDER — SACUBITRIL-VALSARTAN 24-26 MG PO TABS
1.0000 | ORAL_TABLET | Freq: Two times a day (BID) | ORAL | Status: DC
  Administered 2020-09-12 – 2020-09-16 (×8): 1 via ORAL
  Filled 2020-09-12 (×8): qty 1

## 2020-09-12 MED ORDER — ONDANSETRON HCL 4 MG/2ML IJ SOLN: 4.0000 mg | Freq: Four times a day (QID) | INTRAMUSCULAR | Status: DC | PRN

## 2020-09-12 MED ORDER — PRAVASTATIN SODIUM 40 MG PO TABS
80.0000 mg | ORAL_TABLET | Freq: Every day | ORAL | Status: DC
  Administered 2020-09-12 – 2020-09-15 (×4): 80 mg via ORAL
  Filled 2020-09-12 (×5): qty 2

## 2020-09-12 MED ORDER — ICOSAPENT ETHYL 1 G PO CAPS
2.0000 g | ORAL_CAPSULE | Freq: Two times a day (BID) | ORAL | Status: DC
  Administered 2020-09-12 – 2020-09-16 (×8): 2 g via ORAL
  Filled 2020-09-12 (×9): qty 2

## 2020-09-12 MED ORDER — FUROSEMIDE 40 MG PO TABS
60.0000 mg | ORAL_TABLET | Freq: Two times a day (BID) | ORAL | Status: DC
  Administered 2020-09-12 – 2020-09-15 (×6): 60 mg via ORAL
  Filled 2020-09-12 (×7): qty 1

## 2020-09-12 MED ORDER — FENOFIBRATE 160 MG PO TABS
160.0000 mg | ORAL_TABLET | Freq: Every day | ORAL | Status: DC
  Administered 2020-09-12 – 2020-09-16 (×5): 160 mg via ORAL
  Filled 2020-09-12 (×5): qty 1

## 2020-09-12 MED ORDER — ADULT MULTIVITAMIN W/MINERALS CH
1.0000 | ORAL_TABLET | Freq: Every day | ORAL | Status: DC
  Administered 2020-09-12 – 2020-09-16 (×5): 1 via ORAL
  Filled 2020-09-12 (×4): qty 1

## 2020-09-12 MED ORDER — AMIODARONE HCL IN DEXTROSE 360-4.14 MG/200ML-% IV SOLN
30.0000 mg/h | INTRAVENOUS | Status: DC
  Filled 2020-09-12 (×3): qty 200

## 2020-09-12 MED ORDER — INSULIN ASPART 100 UNIT/ML ~~LOC~~ SOLN
0.0000 [IU] | Freq: Three times a day (TID) | SUBCUTANEOUS | Status: DC
  Administered 2020-09-12: 3 [IU] via SUBCUTANEOUS
  Administered 2020-09-13: 11 [IU] via SUBCUTANEOUS
  Administered 2020-09-13: 2 [IU] via SUBCUTANEOUS
  Administered 2020-09-13 – 2020-09-14 (×2): 5 [IU] via SUBCUTANEOUS
  Administered 2020-09-14: 2 [IU] via SUBCUTANEOUS

## 2020-09-12 MED ORDER — METOPROLOL SUCCINATE ER 25 MG PO TB24
25.0000 mg | ORAL_TABLET | Freq: Every day | ORAL | Status: DC
  Administered 2020-09-12 – 2020-09-15 (×4): 25 mg via ORAL
  Filled 2020-09-12 (×4): qty 1

## 2020-09-12 MED ORDER — AMIODARONE HCL IN DEXTROSE 360-4.14 MG/200ML-% IV SOLN
60.0000 mg/h | INTRAVENOUS | Status: AC
  Administered 2020-09-12 (×2): 60 mg/h via INTRAVENOUS
  Filled 2020-09-12: qty 200

## 2020-09-12 NOTE — Telephone Encounter (Signed)
Paged by answering service.  Patient discharged from hospital yesterday.  Reviewed records.  Since this morning patient had 5-6 episode of sudden feeling dizzy and lightheaded.  Some palpitation.  Each episode lasting for about 30 seconds.  Similar symptoms when she had VT.  Reviewed with Dr. Gasper Sells.  Advised patient to come to the ER for admission.  Plan for IV amiodarone and EP evaluation tomorrow.

## 2020-09-12 NOTE — H&P (Signed)
Cardiology Admission History and Physical:   Patient ID: Heidi Stark MRN: [id number]; DOB: 09/21/1940   Admission date: 09/12/2020  PCP:  Lowne Chase, [place]  Cardiologist:  No primary care provider on file.  Advanced Practice Provider:  No care team member to display Electrophysiologist:  [name], MD        Chief Complaint:  Dizziness and near syncope  Patient Profile:   Heidi Stark is a 80 y.o. female with w/ h/o atrial fibrillation and atrial flutter, s/p Afib ablation 10/2016 w/ recurrence, ultimately placed on Tikosyn 04/2018. 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 EF 30-35% on echo in 1/18, down from 50-55% in 4/15. EF wasback up to 50% on 4/18 echo and 55% on 6/19 echo. She had coronary CTA in 2018 that showed coronary calcium score in the 82nd percentile with nonobstructive coronary disease. Has known PAD and COPD in the setting of tobacco abuse. Had VT recently started on PO amiodarone (presented initially with dizziness; admitted 09/08/20 with syncope (WCT from [facility] on DDX at that time).  History of Present Illness:   Heidi Stark  called this AM after 09/11/20 discharge:  In interval had six or seven episodes of near syncope and dizzines. Denies associated CP, palpitations and  or frank syncope.  Feels just like she had in the hospital when she was diagnosed with VT.  Had her medications and was fully compliant.  Notes that prior to 2/23 had issues with hypotension, but has not correlated hypotension with symptoms the week leading up to initially admission.   In ED, started IV amiodarone per our recommendation.  Labs notable for elevated blood glucose, Mg above 2, Hsc Troponin < 18; Moderate leukocytes in urine without symptoms.   Past Medical History:  Diagnosis Date   . A-fib ([place])   . Arthritis    "hands, wrists" (11/07/2016)  . CHF (congestive heart failure) ([place])   . Chronic lower back pain   . Constipation   . COPD (chronic obstructive pulmonary disease) ([place])   . DVT (deep venous thrombosis) ([place])   . Dyspnea   . History of blood transfusion 1990   "related to OR"  . History of kidney stones   . Hyperlipidemia   . Hypertension   . Joint pain   . Lower extremity edema   . OSA on CPAP   . Persistent atrial fibrillation ([place])   . Pneumonia    "several times" (11/07/2016)  . Type II diabetes mellitus ([place])     Past Surgical History:  Procedure Laterality Date  . ATRIAL FIBRILLATION ABLATION N/A 11/07/2016   Procedure: Atrial Fibrillation Ablation;  Surgeon: [name], MD;  Location: [place] [facility];  Service: Cardiovascular;  Laterality: N/A;  . BACK SURGERY    . CARDIAC CATHETERIZATION    . CARDIOVERSION N/A 08/11/2016   Procedure: CARDIOVERSION;  Surgeon: Larey Dresser, MD;  Location: [facility] ENDOSCOPY;  Service: Cardiovascular;  Laterality: N/A;  . CARDIOVERSION N/A 12/03/2017   Procedure: CARDIOVERSION;  Surgeon: Larey Dresser, MD;  Location: [facility] ENDOSCOPY;  Service: Cardiovascular;  Laterality: N/A;  . CARDIOVERSION N/A 05/02/2018   Procedure: CARDIOVERSION;  Surgeon: Pixie Casino, MD;  Location: [facility] ENDOSCOPY;  Service: Cardiovascular;  Laterality: N/A;  . CARDIOVERSION N/A 06/23/2020   Procedure: CARDIOVERSION;  Surgeon: [name], MD;  Location: [facility] ENDOSCOPY;  Service: Cardiovascular;  Laterality: N/A;  . CARPAL TUNNEL RELEASE    . CATARACT EXTRACTION W/ INTRAOCULAR LENS  IMPLANT, BILATERAL Bilateral   . COLON SURGERY  1990   vein graft and colon repair after nicked artery with back surgert  . COLONOSCOPY    . CORONARY ANGIOGRAPHY N/A 09/08/2020   Procedure: CORONARY ANGIOGRAPHY;  Surgeon: Larey Dresser, MD;  Location: [place] [facility];  Service: Cardiovascular;  Laterality: N/A;  . CYSTECTOMY      between bladder and kidneys  . GANGLION CYST EXCISION Left   . KNEE CARTILAGE SURGERY Right 1960s  . [place] SURGERY  1990  . REPAIR ILIAC ARTERY  1990   vein graft and colon repair after nicked artery with back surgert  . TEE WITHOUT CARDIOVERSION N/A 10/31/2013   Procedure: TRANSESOPHAGEAL ECHOCARDIOGRAM (TEE);  Surgeon: Larey Dresser, MD;  Location: [place];  Service: Cardiovascular;  Laterality: N/A;  . TUBAL LIGATION       Medications Prior to Admission: Prior to Admission medications   Medication Sig Start Date End Date Taking? Authorizing Provider  acetaminophen (TYLENOL) 500 MG tablet Take 500 mg by mouth every 6 (six) hours as needed for moderate pain or headache.    [provider]  amiodarone (PACERONE) 400 MG tablet Take 400mg  twice daily for 2 weeks. Then on 09/24/2020, transition to 400mg  daily. 09/11/20   Darreld Mclean, PA-C  Dapagliflozin-metFORMIN HCl ER (XIGDUO XR) 11-998 MG TB24 Take 1 tablet by mouth in the morning and at bedtime.    [provider]  ELIQUIS 5 MG TABS tablet TAKE 1 TABLET BY MOUTH TWICE A DAY Patient taking differently: Take 5 mg by mouth 2 (two) times daily. 02/13/20   Larey Dresser, MD  fenofibrate (TRICOR) 145 MG tablet TAKE 1 TABLET BY MOUTH EVERY DAY Patient taking differently: Take 145 mg by mouth at bedtime. 02/13/20   Larey Dresser, MD  furosemide (LASIX) 40 MG tablet Take 1.5 tablets (60 mg total) by mouth 2 (two) times daily. 01/29/20   Larey Dresser, MD  insulin glargine (LANTUS) 100 UNIT/ML injection Inject 15 Units into the skin at bedtime.     [provider]  Insulin Pen Needle (BD PEN NEEDLE NANO U/F) 32G X 4 MM MISC See admin instructions.    [provider]  LIVALO 4 MG TABS TAKE 1 TABLET BY MOUTH EVERY DAY Patient taking differently: Take 4 mg by mouth daily. 06/14/20   Camnitz, Ocie Doyne, MD  metoprolol succinate (TOPROL-XL) 25 MG 24 hr tablet Take 1 tablet (25 mg total) by  mouth at bedtime. 09/11/20   Darreld Mclean, PA-C  Multiple Vitamin (MULTIVITAMIN WITH MINERALS) TABS tablet Take 1 tablet by mouth daily.    [provider]  potassium chloride (KLOR-CON M10) 10 MEQ tablet Take 2 tablets (20 mEq total) by mouth 2 (two) times daily. 04/07/20   Larey Dresser, MD  sacubitril-valsartan (ENTRESTO) 24-26 MG Take 1 tablet by mouth 2 (two) times daily. 09/11/20   Sande Rives E, PA-C  tobramycin-dexamethasone [facility]) ophthalmic solution Place 1 drop into both eyes daily as needed (redness). 09/11/20   Sande Rives E, PA-C  VASCEPA 1 g capsule TAKE 2 CAPSULES BY MOUTH TWICE A DAY Patient taking differently: Take 2,000 g by mouth 2 (two) times daily. 08/06/20   Larey Dresser, MD     Allergies:    Allergies  Allergen Reactions  . Neomycin-Bacitracin Zn-Polymyx Rash  .  Sulfonamide Derivatives Other (See Comments)    Stomach cramps  . Ciprofloxacin Hives, Itching and Rash  . Spironolactone Rash    Social History:   Social History   Socioeconomic History  . Marital status: Married    Spouse name: [name]  . Number of children: 2  . Years of education: Not on file  . Highest education level: Not on file  Occupational History  . Occupation: Retired  Tobacco Use  . Smoking status: Former Smoker    Packs/day: 1.00    Years: 50.00    Pack years: 50.00    Types: Cigarettes  . Smokeless tobacco: Never Used  Vaping Use  . Vaping Use: Never used  Substance and Sexual Activity  . Alcohol use: No    Alcohol/week: 0.0 standard drinks  . Drug use: No  . Sexual activity: Not on file  Other Topics Concern  . Not on file  Social History Narrative  . Not on file   Social Determinants of Health   Financial Resource Strain: Not on file  Food Insecurity: Not on file  Transportation Needs: Not on file  Physical Activity: Not on file  Stress: Not on file  Social Connections: Not on file  Intimate Partner Violence: Not on file     Family History:   The patient's family history includes Cancer in her mother; Diabetes in her father, mother, and another family member; Factor V Leiden deficiency in her daughter; Hypertension in her mother; Melanoma in an other family member; Obesity in her father and mother.    ROS:  Please see the history of present illness.  All other ROS reviewed and negative.     Physical Exam/Data:   Vitals:   09/12/20 1330 09/12/20 1400 09/12/20 1445 09/12/20 1500  BP: 112/60 102/89 127/70 120/75  Pulse: 77 85 86 82  Resp: (!) 24 (!) 21 (!) 22 20  Temp:      TempSrc:      SpO2: 93% 97% 98% 94%  Weight:      Height:       No intake or output data in the 24 hours ending 09/12/20 1506 Last 3 Weights 09/12/2020 09/11/2020 09/10/2020  Weight (lbs) 260 lb 12.9 oz 253 lb 2.5 oz 253 lb 6.4 oz  Weight (kg) 118.3 kg 114.83 kg 114.941 kg     Body mass index is 42.09 kg/m.  General:  Obese female, well developed, in no acute distress HEENT: normal Lymph: no adenopathy Neck: no JVD Endocrine:  No thryomegaly Vascular: No carotid bruits, DP pulses +2 Cardiac:  normal S1, S2; RRR; I/IV systolic murmur Lungs:  clear to auscultation bilaterally, no wheezing, rhonchi or rales  Abd: soft, nontender, no hepatomegaly  Ext: no edema Musculoskeletal:  No deformities, BUE and BLE strength normal and equal Skin: warm and dry  Neuro:  CNs 2-12 intact, no focal abnormalities noted Psych:  Normal affect   EKG:  The ECG that was done  was personally reviewed and demonstrates likely AF with variable block rate 86 QTc 450   Laboratory Data:  High Sensitivity Troponin:   Recent Labs  Lab 09/07/20 1004 09/07/20 1210 09/12/20 1145 09/12/20 1403  TROPONINIHS 13 12 13 15       Chemistry Recent Labs  Lab 09/11/20 0235 09/12/20 1145  NA 136 140  K 3.9 4.2  CL 98 99  CO2 29 27  GLUCOSE 165* 170*  BUN 13 18  CREATININE 1.01* 1.06*  CALCIUM 8.6* 8.5*  GFRNONAA 57* 54*  ANIONGAP 9 14    Recent  Labs  Lab 09/07/20 1004  PROT 7.1  ALBUMIN 3.6  AST 32  ALT 22  ALKPHOS 40  BILITOT 0.7   Hematology Recent Labs  Lab 09/07/20 1004 09/07/20 1015 09/12/20 1145  WBC 8.1  --  8.5  RBC 5.36*  --  4.84  HGB 16.7* 17.7* 14.9  HCT 50.3* 52.0* 45.2  MCV 93.8  --  93.4  MCH 31.2  --  30.8  MCHC 33.2  --  33.0  RDW 15.6*  --  15.1  PLT 317  --  298   BNP Recent Labs  Lab 09/07/20 1004  BNP 85.9    DDimer No results for input(s): DDIMER in the last 168 hours.   Radiology/Studies:  DG Chest Port 1 View  Result Date: 09/12/2020 CLINICAL DATA:  80 year old female with history of syncope. EXAM: PORTABLE CHEST 1 VIEW COMPARISON:  Chest x-ray 09/07/2020. FINDINGS: Lung volumes are normal. No consolidative airspace disease. No pleural effusions. No pneumothorax. No suspicious appearing pulmonary nodules or masses are noted. Cephalization of the pulmonary vasculature, without frank pulmonary edema. Mild cardiomegaly. Upper mediastinal contours are within normal limits. Aortic atherosclerosis. IMPRESSION: 1. Cardiomegaly with pulmonary venous congestion, but no frank pulmonary edema. 2. Aortic atherosclerosis. Electronically Signed   By: Vinnie Langton M.D.   On: 09/12/2020 12:24     Assessment and Plan:   VT  - will place on telemetry - will start IV amidarone planned for IV load - will return metoprolol succinate 25 mg Po Daily (S&H) - Diet NPO @ midnight only for worsening VT (needing intervention); may be able to continue diet if uneventful load  Persistent Atrial Fibrillation/Flutter  - Eliquis 5mg  twice daily ([facility]) - BB as above  Chronic Combined CHF  - mixed picture AF and microvascular disease - continue home diuretic dose (lasix 60 mg BID) (S&H) - holding meformin and SGLT2i combo, SSI protocol started - continue home Entresto (S&H)  Non-Obstructive CAD  INOCA with CAD scar - AC as above so no ASA - continue statin, fenofibrate and Vascepa (S&H) - BB as  above - prn nitrates restarted (S&H)  COPD  - Monitor closely on Amiodarone. Consider PFTs as outpatient.  - on no home meds; no wheeze  Morbid Obesity Obstructive Sleep Apnea  -Continue CPAP   Full Code Labs Ordered (S&H) DVT Proph- eliquis Diet placed:  Carb Consistent Heart Healthy (S&H)  Discussed with patient and daugther  Risk Assessment/Risk Scores:        New York Heart Association (NYHA) Functional Class NYHA Class I  CHA2DS2-VASc Score = 7  This indicates a 11.2% annual risk of stroke. The patient's score is based upon: CHF History: Yes HTN History: Yes Diabetes History: Yes Stroke History: No Vascular Disease History: Yes Age Score: 2 Gender Score: 1      Severity of Illness: The appropriate patient status for this patient is OBSERVATION. Observation status is judged to be reasonable and necessary in order to provide the required intensity of service to ensure the patient's safety. The patient's presenting symptoms, physical exam findings, and initial radiographic and laboratory data in the context of their medical condition is felt to place them at decreased risk for further clinical deterioration. Furthermore, it is anticipated that the patient will be medically stable for discharge from the hospital within 2 midnights of admission. The following factors support the patient status of observation.   " The patient's presenting symptoms include near syncope with  prior VT associated near syncope.     For questions or updates, please contact [place] Please consult [email or web address] for contact info under     Signed, Werner Lean, MD  09/12/2020 3:06 PM

## 2020-09-12 NOTE — ED Triage Notes (Signed)
PT BIB [facility] with c/o several near syncopal episodes this am. Pt denies CP/SOB or dizziness at this time. Pt was d/c'd from [facility] yesterday; was admitted for afib with VT episodes. Pt's Cardiologist Algernon Huxley) sent here for similar symptoms from her recent admission. Pt afib on arrival; HR 85. BGL 201.

## 2020-09-12 NOTE — ED Notes (Signed)
Blood drawn and sent to lab as save tubes.

## 2020-09-12 NOTE — ED Provider Notes (Signed)
[facility] EMERGENCY DEPARTMENT Provider Note   CSN: [id number] Arrival date & time: 09/12/20  1117     History Chief Complaint  Patient presents with  . Near Syncope    Heidi Stark is a 80 y.o. female.  HPI Patient reports she is getting similar episodes of lightheadedness this morning to those that precipitated her hospitalization earlier this week.  Patient was just discharged from the hospital day before yesterday.  She had episodes of ventricular tachycardia.  Patient has been changed to amiodarone.  She reports she is taking all of her prescribed morning medications.  She denies that she had loss of consciousness but she did feel weak and as if she might pass out.  No associated chest pain.  No shortness of breath.  She reports these are the same symptoms she had at her last hospitalization.  She called the on-call for cardiology and was advised to come into the emergency department.  She is only been discharged for a day.  She has not had any change in general condition.  She has not developed any vomiting, fever or productive cough in the interim.    Past Medical History:  Diagnosis Date  . A-fib ([place])   . Arthritis    "hands, wrists" (11/07/2016)  . CHF (congestive heart failure) ([place])   . Chronic lower back pain   . Constipation   . COPD (chronic obstructive pulmonary disease) ([place])   . DVT (deep venous thrombosis) ([place])   . Dyspnea   . History of blood transfusion 1990   "related to OR"  . History of kidney stones   . Hyperlipidemia   . Hypertension   . Joint pain   . Lower extremity edema   . OSA on CPAP   . Persistent atrial fibrillation ([place])   . Pneumonia    "several times" (11/07/2016)  . Type II diabetes mellitus [facility])     Patient Active Problem List   Diagnosis Date Noted  . Wide-complex tachycardia ([place]) 09/07/2020  . Ventricular tachyarrhythmia ([place]) 09/07/2020  . VT (ventricular tachycardia) ([place]) 09/07/2020  . Persistent  atrial fibrillation ([place]) 04/30/2018  . Wound of left leg 10/01/2017  . Cellulitis of right lower extremity 11/27/2016  . Encounter for assessment for deep vein thrombosis (DVT) 11/14/2016  . Hypokalemia 08/13/2016  . Constipation 08/13/2016  . Acute respiratory failure with hypoxia ([place]) 08/10/2016  . Atrial fibrillation with rapid ventricular response ([place]) 08/10/2016  . Type 2 diabetes mellitus with hyperglycemia, with long-term current use of insulin ([place])   . Atherosclerosis of aorta ([place]) 06/26/2016  . Tobacco abuse 06/26/2016  . Colitis 11/24/2014  . Severe obesity (BMI >= 40) ([place]) 11/12/2013  . PAF (paroxysmal atrial fibrillation) ([place]) 11/03/2013  . 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 COLONIC POLYPS 08/16/2010  . PAD (peripheral artery disease) ([place]) 08/15/2010  . HLD (hyperlipidemia) 11/18/2009  . MYALGIA 11/18/2009  . OBESITY 05/14/2007  . PULMONARY NODULE, RIGHT MIDDLE LOBE 05/14/2007  . Type 2 diabetes mellitus with vascular disease ([place]) 03/20/2007  . Obstructive sleep apnea 12/06/2006  . Essential hypertension 12/06/2006  . COPD (chronic obstructive pulmonary disease) ([place]) 12/06/2006    Past Surgical History:  Procedure Laterality Date  . ATRIAL FIBRILLATION ABLATION N/A 11/07/2016   Procedure: Atrial Fibrillation Ablation;  Surgeon: [name], MD;  Location: [facility] CV LAB;  Service: Cardiovascular;  Laterality: N/A;  . BACK SURGERY    . CARDIAC CATHETERIZATION    .  CARDIOVERSION N/A 08/11/2016   Procedure: CARDIOVERSION;  Surgeon: Larey Dresser, MD;  Location: [facility] ENDOSCOPY;  Service: Cardiovascular;  Laterality: N/A;  . CARDIOVERSION N/A 12/03/2017   Procedure: CARDIOVERSION;  Surgeon: Larey Dresser, MD;  Location: [facility] ENDOSCOPY;  Service: Cardiovascular;  Laterality: N/A;  . CARDIOVERSION N/A 05/02/2018   Procedure: CARDIOVERSION;  Surgeon: Pixie Casino, MD;  Location: [facility] ENDOSCOPY;  Service:  Cardiovascular;  Laterality: N/A;  . CARDIOVERSION N/A 06/23/2020   Procedure: CARDIOVERSION;  Surgeon: [name], MD;  Location: [facility] ENDOSCOPY;  Service: Cardiovascular;  Laterality: N/A;  . CARPAL TUNNEL RELEASE    . CATARACT EXTRACTION W/ INTRAOCULAR LENS  IMPLANT, BILATERAL Bilateral   . COLON SURGERY  1990   vein graft and colon repair after nicked artery with back surgert  . COLONOSCOPY    . CORONARY ANGIOGRAPHY N/A 09/08/2020   Procedure: CORONARY ANGIOGRAPHY;  Surgeon: Larey Dresser, MD;  Location: [place] CV LAB;  Service: Cardiovascular;  Laterality: N/A;  . CYSTECTOMY     between bladder and kidneys  . GANGLION CYST EXCISION Left   . KNEE CARTILAGE SURGERY Right 1960s  . [place] SURGERY  1990  . REPAIR ILIAC ARTERY  1990   vein graft and colon repair after nicked artery with back surgert  . TEE WITHOUT CARDIOVERSION N/A 10/31/2013   Procedure: TRANSESOPHAGEAL ECHOCARDIOGRAM (TEE);  Surgeon: Larey Dresser, MD;  Location: [facility] ENDOSCOPY;  Service: Cardiovascular;  Laterality: N/A;  . TUBAL LIGATION       OB History    Gravida  2   Para      Term      Preterm      AB      Living        SAB      IAB      Ectopic      Multiple      Live Births              Family History  Problem Relation Age of Onset  . Diabetes Mother   . Hypertension Mother   . Cancer Mother   . Obesity Mother   . Diabetes Father   . Obesity Father   . Diabetes Other   . Melanoma Other   . Factor V Leiden deficiency Daughter     Social History   Tobacco Use  . Smoking status: Former Smoker    Packs/day: 1.00    Years: 50.00    Pack years: 50.00    Types: Cigarettes  . Smokeless tobacco: Never Used  Vaping Use  . Vaping Use: Never used  Substance Use Topics  . Alcohol use: No    Alcohol/week: 0.0 standard drinks  . Drug use: No    Home Medications Prior to Admission medications   Medication Sig Start Date End Date Taking? Authorizing Provider   acetaminophen (TYLENOL) 500 MG tablet Take 500 mg by mouth every 6 (six) hours as needed for moderate pain or headache.    [provider]  amiodarone (PACERONE) 400 MG tablet Take 400mg  twice daily for 2 weeks. Then on 09/24/2020, transition to 400mg  daily. 09/11/20   Darreld Mclean, PA-C  Dapagliflozin-metFORMIN HCl ER (XIGDUO XR) 11-998 MG TB24 Take 1 tablet by mouth in the morning and at bedtime.    [provider]  ELIQUIS 5 MG TABS tablet TAKE 1 TABLET BY MOUTH TWICE A DAY Patient taking differently: Take 5 mg by mouth 2 (two) times daily. 02/13/20  Larey Dresser, MD  fenofibrate (TRICOR) 145 MG tablet TAKE 1 TABLET BY MOUTH EVERY DAY Patient taking differently: Take 145 mg by mouth at bedtime. 02/13/20   Larey Dresser, MD  furosemide (LASIX) 40 MG tablet Take 1.5 tablets (60 mg total) by mouth 2 (two) times daily. 01/29/20   Larey Dresser, MD  insulin glargine (LANTUS) 100 UNIT/ML injection Inject 15 Units into the skin at bedtime.     [provider]  Insulin Pen Needle (BD PEN NEEDLE NANO U/F) 32G X 4 MM MISC See admin instructions.    [provider]  LIVALO 4 MG TABS TAKE 1 TABLET BY MOUTH EVERY DAY Patient taking differently: Take 4 mg by mouth daily. 06/14/20   Camnitz, Ocie Doyne, MD  metoprolol succinate (TOPROL-XL) 25 MG 24 hr tablet Take 1 tablet (25 mg total) by mouth at bedtime. 09/11/20   Darreld Mclean, PA-C  Multiple Vitamin (MULTIVITAMIN WITH MINERALS) TABS tablet Take 1 tablet by mouth daily.    [provider]  potassium chloride (KLOR-CON M10) 10 MEQ tablet Take 2 tablets (20 mEq total) by mouth 2 (two) times daily. 04/07/20   Larey Dresser, MD  sacubitril-valsartan (ENTRESTO) 24-26 MG Take 1 tablet by mouth 2 (two) times daily. 09/11/20   Sande Rives E, PA-C  tobramycin-dexamethasone [facility]) ophthalmic solution Place 1 drop into both eyes daily as needed (redness). 09/11/20   Sande Rives E, PA-C   VASCEPA 1 g capsule TAKE 2 CAPSULES BY MOUTH TWICE A DAY Patient taking differently: Take 2,000 g by mouth 2 (two) times daily. 08/06/20   Larey Dresser, MD    Allergies    Neomycin-bacitracin zn-polymyx, Sulfonamide derivatives, Ciprofloxacin, and Spironolactone  Review of Systems   Review of Systems 10 systems reviewed and negative except as per HPI Physical Exam Updated Vital Signs BP 102/89   Pulse 85   Temp 98.7 F (37.1 C) (Oral)   Resp (!) 21   Ht 5\' 6"  (1.676 m)   Wt 118.3 kg   SpO2 97%   BMI 42.09 kg/m   Physical Exam Constitutional:      Comments: Alert and nontoxic.  Generally fatigued in appearance.  No respiratory distress at rest.  HENT:     Mouth/Throat:     Pharynx: Oropharynx is clear.  Cardiovascular:     Rate and Rhythm: Normal rate and regular rhythm.  Pulmonary:     Effort: Pulmonary effort is normal.     Breath sounds: Normal breath sounds.  Abdominal:     General: There is no distension.     Palpations: Abdomen is soft.     Tenderness: There is no abdominal tenderness. There is no guarding.     Comments: Candidal rash in groin folds.  Musculoskeletal:        General: Normal range of motion.     Right lower leg: No edema.     Left lower leg: No edema.     Comments: No lower extremity edema.  Skin:    General: Skin is warm and dry.  Neurological:     General: No focal deficit present.     Mental Status: She is oriented to person, place, and time.     Coordination: Coordination normal.     ED Results / Procedures / Treatments   Labs (all labs ordered are listed, but only abnormal results are displayed) Labs Reviewed  BASIC METABOLIC PANEL - Abnormal; Notable for the following components:  Result Value   Glucose, Bld 170 (*)    Creatinine, Ser 1.06 (*)    Calcium 8.5 (*)    GFR, Estimated 54 (*)    All other components within normal limits  RESP PANEL BY RT-PCR (FLU A&B, COVID) ARPGX2  CBC WITH DIFFERENTIAL/PLATELET   PROTIME-INR  MAGNESIUM  URINALYSIS, ROUTINE W REFLEX MICROSCOPIC  TROPONIN I (HIGH SENSITIVITY)  TROPONIN I (HIGH SENSITIVITY)    EKG EKG Interpretation  Date/Time:  Sunday September 12 2020 11:23:34 EST Ventricular Rate:  86 PR Interval:    QRS Duration: 123 QT Interval:  372 QTC Calculation: 445 R Axis:   9 Text Interpretation: Sinus rhythm Prolonged PR interval Nonspecific intraventricular conduction delay Nonspecific T abnrm, anterolateral leads no change from previous Confirmed by Charlesetta Shanks [phone]) on 09/12/2020 12:07:55 PM   Radiology DG Chest Port 1 View  Result Date: 09/12/2020 CLINICAL DATA:  80 year old female with history of syncope. EXAM: PORTABLE CHEST 1 VIEW COMPARISON:  Chest x-ray 09/07/2020. FINDINGS: Lung volumes are normal. No consolidative airspace disease. No pleural effusions. No pneumothorax. No suspicious appearing pulmonary nodules or masses are noted. Cephalization of the pulmonary vasculature, without frank pulmonary edema. Mild cardiomegaly. Upper mediastinal contours are within normal limits. Aortic atherosclerosis. IMPRESSION: 1. Cardiomegaly with pulmonary venous congestion, but no frank pulmonary edema. 2. Aortic atherosclerosis. Electronically Signed   By: Vinnie Langton M.D.   On: 09/12/2020 12:24    Procedures Procedures   Medications Ordered in ED Medications  amiodarone (NEXTERONE PREMIX) 360-4.14 MG/200ML-% (1.8 mg/mL) IV infusion (has no administration in time range)  amiodarone (NEXTERONE PREMIX) 360-4.14 MG/200ML-% (1.8 mg/mL) IV infusion (has no administration in time range)    ED Course  I have reviewed the triage vital signs and the nursing notes.  Pertinent labs & imaging results that were available during my care of the patient were reviewed by me and considered in my medical decision making (see chart for details).    MDM Rules/Calculators/A&P                          Consult: Cardiology for admission.  Patient  presents with recurrence of lightheadedness.  Patient has known paroxysmal ventricular tachycardia.  Symptoms had been controlled during hospitalization with amiodarone.  Patient has been compliant with medications since her discharge.  She again became symptomatic.  Plan will be for readmission by cardiology for monitoring and determination about placing a pacer defibrillator.  Patient is already taken her a.m. medications.  She is stable in appearance at this time.  Monitor shows a sinus rhythm rate controlled in the 70s and 80s.  No change in EKG as compared to previous. Final Clinical Impression(s) / ED Diagnoses Final diagnoses:  Pre-syncope  H/O sustained ventricular tachycardia    Rx / DC Orders ED Discharge Orders    None       Charlesetta Shanks, MD 09/12/20 1418

## 2020-09-13 DIAGNOSIS — I4819 Other persistent atrial fibrillation: Secondary | ICD-10-CM | POA: Diagnosis not present

## 2020-09-13 DIAGNOSIS — I472 Ventricular tachycardia: Secondary | ICD-10-CM | POA: Diagnosis not present

## 2020-09-13 DIAGNOSIS — I428 Other cardiomyopathies: Secondary | ICD-10-CM

## 2020-09-13 LAB — GLUCOSE, CAPILLARY
Glucose-Capillary: 222 mg/dL — ABNORMAL HIGH (ref 70–99)
Glucose-Capillary: 325 mg/dL — ABNORMAL HIGH (ref 70–99)
Glucose-Capillary: 301 mg/dL — ABNORMAL HIGH (ref 70–99)
Glucose-Capillary: 147 mg/dL — ABNORMAL HIGH (ref 70–99)
Glucose-Capillary: 273 mg/dL — ABNORMAL HIGH (ref 70–99)

## 2020-09-13 LAB — CBC
HCT: 43.9 % (ref 36.0–46.0)
Hemoglobin: 14.3 g/dL (ref 12.0–15.0)
MCH: 30.5 pg (ref 26.0–34.0)
MCHC: 32.6 g/dL (ref 30.0–36.0)
MCV: 93.6 fL (ref 80.0–100.0)
Platelets: 274 10*3/uL (ref 150–400)
RBC: 4.69 MIL/uL (ref 3.87–5.11)
RDW: 15 % (ref 11.5–15.5)
WBC: 7.7 10*3/uL (ref 4.0–10.5)
nRBC: 0 % (ref 0.0–0.2)

## 2020-09-13 LAB — PROTIME-INR
INR: 1.3 — ABNORMAL HIGH (ref 0.8–1.2)
Prothrombin Time: 15.7 seconds — ABNORMAL HIGH (ref 11.4–15.2)

## 2020-09-13 LAB — BASIC METABOLIC PANEL
Anion gap: 11 (ref 5–15)
BUN: 16 mg/dL (ref 8–23)
CO2: 26 mmol/L (ref 22–32)
Calcium: 8.8 mg/dL — ABNORMAL LOW (ref 8.9–10.3)
Chloride: 102 mmol/L (ref 98–111)
Creatinine, Ser: 1.18 mg/dL — ABNORMAL HIGH (ref 0.44–1.00)
GFR, Estimated: 47 mL/min — ABNORMAL LOW (ref >60)
Glucose, Bld: 197 mg/dL — ABNORMAL HIGH (ref 70–99)
Potassium: 3.7 mmol/L (ref 3.5–5.1)
Sodium: 139 mmol/L (ref 135–145)

## 2020-09-13 MED ORDER — SODIUM CHLORIDE 0.9 % IV SOLN: INTRAVENOUS | Status: DC

## 2020-09-13 MED ORDER — INSULIN GLARGINE 100 UNIT/ML ~~LOC~~ SOLN
10.0000 [IU] | Freq: Every day | SUBCUTANEOUS | Status: DC
  Administered 2020-09-13: 10 [IU] via SUBCUTANEOUS
  Filled 2020-09-13 (×2): qty 0.1

## 2020-09-13 MED ORDER — SODIUM CHLORIDE 0.9% FLUSH
3.0000 mL | Freq: Two times a day (BID) | INTRAVENOUS | Status: DC
  Administered 2020-09-13 – 2020-09-15 (×4): 3 mL via INTRAVENOUS

## 2020-09-13 MED ORDER — POTASSIUM CHLORIDE CRYS ER 20 MEQ PO TBCR
20.0000 meq | EXTENDED_RELEASE_TABLET | Freq: Once | ORAL | Status: AC
  Administered 2020-09-13: 20 meq via ORAL

## 2020-09-13 MED ORDER — AMIODARONE HCL 200 MG PO TABS
400.0000 mg | ORAL_TABLET | Freq: Three times a day (TID) | ORAL | Status: DC
  Administered 2020-09-13 – 2020-09-16 (×9): 400 mg via ORAL
  Filled 2020-09-13 (×9): qty 2

## 2020-09-13 NOTE — Progress Notes (Signed)
  Per EP plan for ICD this admit.  For this reason will cancel TEE/DC-CV   Fate Galanti NP-C  3:45 PM

## 2020-09-13 NOTE — Consult Note (Signed)
Advanced Heart Failure Team Consult Note   Primary Physician: Ann Held, DO PCP-Cardiologist:  No primary care provider on file.  Reason for Consultation: Recurrent presyncope  HPI:    Heidi Stark is seen today for evaluation of presyncope/atrial flutter/CHF at the request of Vick Frees.   80 y/o female w/ h/o atrial fibrillation and atrial flutter, s/p Afib ablation 10/2016 w/ recurrence, ultimately placed on Tikosyn 04/2018. 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 EF 30-35% on echo in 1/18, down from 50-55% in 4/15. EF wasback up to 50% on 4/18 echo and 55% on 6/19 echo. She had coronary CTA in 2018 that showed coronary calcium score in the 82nd percentile with nonobstructive coronary disease. Has known PAD and COPD in the setting of tobacco abuse.   She had a recent admission in 2/22 with lightheadedness/presyncope that appeared to correlate with runs of VT.  She was on Tikosyn for atrial fibrillation.  Tikosyn was stopped and amiodarone was started.  Cardiac MRI showed EF 40% with subendocardial scar in the basal to mid inferolateral wall, RV EF was 38%.  She had coronary angiography showing no obstructive disease.  VT subsided and she was sent home on amiodarone.   Yesterday, she had 3 episodes of presyncope in succession while sitting at a desk.  No prodrome, each episode of severe lightheadedness lasted maybe 30 seconds.  She did not pass out. Because of these events, she came to the ER. She was admitted for observation and amiodarone gtt was restarted.  She had no VT overnight.  She remains in atypical atrial flutter today (was in atypical flutter at last admission).    Review of Systems: All systems reviewed and negative except as per HPI.  Home Medications Prior to Admission medications   Medication Sig Start Date  End Date Taking? Authorizing Provider  acetaminophen (TYLENOL) 500 MG tablet Take 500 mg by mouth every 6 (six) hours as needed for moderate pain or headache.   Yes [provider]  amiodarone (PACERONE) 400 MG tablet Take 400mg  twice daily for 2 weeks. Then on 09/24/2020, transition to 400mg  daily. Patient taking differently: Take 400 mg by mouth 2 (two) times daily. Take 400mg  twice daily for 2 weeks. Then on 09/24/2020, transition to 400mg  daily. 09/11/20  Yes Sande Rives E, PA-C  Dapagliflozin-metFORMIN HCl ER (XIGDUO XR) 11-998 MG TB24 Take 1 tablet by mouth in the morning and at bedtime.   Yes [provider]  ELIQUIS 5 MG TABS tablet TAKE 1 TABLET BY MOUTH TWICE A DAY Patient taking differently: Take 5 mg by mouth 2 (two) times daily. 02/13/20  Yes Larey Dresser, MD  fenofibrate (TRICOR) 145 MG tablet TAKE 1 TABLET BY MOUTH EVERY DAY Patient taking differently: Take 145 mg by mouth at bedtime. 02/13/20  Yes Larey Dresser, MD  furosemide (LASIX) 40 MG tablet Take 1.5 tablets (60 mg total) by mouth 2 (two) times daily. 01/29/20  Yes Larey Dresser, MD  insulin glargine (LANTUS) 100 UNIT/ML injection Inject 15 Units into the skin at bedtime.    Yes [provider]  Insulin Pen Needle (BD PEN NEEDLE NANO U/F) 32G X 4 MM MISC See admin instructions.   Yes [provider]  LIVALO 4 MG TABS TAKE 1 TABLET BY MOUTH EVERY DAY Patient taking differently: Take 4 mg by mouth daily. 06/14/20  Yes Camnitz, Ocie Doyne, MD  metoprolol succinate (TOPROL-XL) 25 MG 24 hr tablet Take 1 tablet (25 mg total) by mouth at bedtime. 09/11/20  Yes Sande Rives E, PA-C  Multiple Vitamin (MULTIVITAMIN WITH MINERALS) TABS tablet Take 1 tablet by mouth daily.   Yes [provider]  potassium chloride (KLOR-CON M10) 10 MEQ tablet Take 2 tablets (20 mEq total) by mouth 2 (two) times daily. 04/07/20  Yes Larey Dresser, MD  sacubitril-valsartan (ENTRESTO) 24-26 MG Take 1  tablet by mouth 2 (two) times daily. 09/11/20  Yes Goodrich, Callie E, PA-C  VASCEPA 1 g capsule TAKE 2 CAPSULES BY MOUTH TWICE A DAY Patient taking differently: Take 2,000 g by mouth 2 (two) times daily. 08/06/20  Yes Larey Dresser, MD    Past Medical History: Past Medical History:  Diagnosis Date  . A-fib ([place])   . Arthritis    "hands, wrists" (11/07/2016)  . CHF (congestive heart failure) ([place])   . Chronic lower back pain   . Constipation   . COPD (chronic obstructive pulmonary disease) ([place])   . DVT (deep venous thrombosis) ([place])   . Dyspnea   . History of blood transfusion 1990   "related to OR"  . History of kidney stones   . Hyperlipidemia   . Hypertension   . Joint pain   . Lower extremity edema   . OSA on CPAP   . Persistent atrial fibrillation ([place])   . Pneumonia    "several times" (11/07/2016)  . Type II diabetes mellitus ([place])     Past Surgical History: Past Surgical History:  Procedure Laterality Date  . ATRIAL FIBRILLATION ABLATION N/A 11/07/2016   Procedure: Atrial Fibrillation Ablation;  Surgeon: [name], MD;  Location: [facility];  Service: Cardiovascular;  Laterality: N/A;  . BACK SURGERY    . CARDIAC CATHETERIZATION    . CARDIOVERSION N/A 08/11/2016   Procedure: CARDIOVERSION;  Surgeon: Larey Dresser, MD;  Location: [facility] ENDOSCOPY;  Service: Cardiovascular;  Laterality: N/A;  . CARDIOVERSION N/A 12/03/2017   Procedure: CARDIOVERSION;  Surgeon: Larey Dresser, MD;  Location: [facility] ENDOSCOPY;  Service: Cardiovascular;  Laterality: N/A;  . CARDIOVERSION N/A 05/02/2018   Procedure: CARDIOVERSION;  Surgeon: Pixie Casino, MD;  Location: [facility] ENDOSCOPY;  Service: Cardiovascular;  Laterality: N/A;  . CARDIOVERSION N/A 06/23/2020   Procedure: CARDIOVERSION;  Surgeon: [name], MD;  Location: [facility] ENDOSCOPY;  Service: Cardiovascular;  Laterality: N/A;  . CARPAL TUNNEL RELEASE    . CATARACT EXTRACTION W/ INTRAOCULAR LENS  IMPLANT,  BILATERAL Bilateral   . COLON SURGERY  1990   vein graft and colon repair after nicked artery with back surgert  . COLONOSCOPY    . CORONARY ANGIOGRAPHY N/A 09/08/2020   Procedure: CORONARY ANGIOGRAPHY;  Surgeon: Larey Dresser, MD;  Location: [facility];  Service: Cardiovascular;  Laterality: N/A;  . CYSTECTOMY     between bladder and kidneys  . GANGLION CYST EXCISION Left   . KNEE CARTILAGE SURGERY Right 1960s  . [place] SURGERY  1990  . REPAIR ILIAC ARTERY  1990   vein graft and colon repair after nicked artery with back surgert  . TEE WITHOUT CARDIOVERSION N/A 10/31/2013   Procedure: TRANSESOPHAGEAL ECHOCARDIOGRAM (TEE);  Surgeon: Larey Dresser, MD;  Location: [facility] ENDOSCOPY;  Service: Cardiovascular;  Laterality: N/A;  . TUBAL LIGATION      Family History: Family History  Problem Relation Age of Onset  . Diabetes Mother   .  Hypertension Mother   . Cancer Mother   . Obesity Mother   . Diabetes Father   . Obesity Father   . Diabetes Other   . Melanoma Other   . Factor V Leiden deficiency Daughter     Social History: Social History   Socioeconomic History  . Marital status: Married    Spouse name: [name]  . Number of children: 2  . Years of education: Not on file  . Highest education level: Not on file  Occupational History  . Occupation: Retired  Tobacco Use  . Smoking status: Former Smoker    Packs/day: 1.00    Years: 50.00    Pack years: 50.00    Types: Cigarettes  . Smokeless tobacco: Never Used  Vaping Use  . Vaping Use: Never used  Substance and Sexual Activity  . Alcohol use: No    Alcohol/week: 0.0 standard drinks  . Drug use: No  . Sexual activity: Not on file  Other Topics Concern  . Not on file  Social History Narrative  . Not on file   Social Determinants of Health   Financial Resource Strain: Not on file  Food Insecurity: Not on file  Transportation Needs: Not on file  Physical Activity: Not on file  Stress: Not on file   Social Connections: Not on file    Allergies:  Allergies  Allergen Reactions  . Neomycin-Bacitracin Zn-Polymyx Rash  . Sulfonamide Derivatives Other (See Comments)    Stomach cramps  . Ciprofloxacin Hives, Itching and Rash  . Spironolactone Rash    Objective:    Vital Signs:   Temp:  [97.7 F (36.5 C)-98.7 F (37.1 C)] 98 F (36.7 C) (02/28 0814) Pulse Rate:  [77-91] 82 (02/28 0814) Resp:  [12-24] 18 (02/28 0814) BP: (91-134)/(48-89) 103/55 (02/28 0814) SpO2:  [92 %-99 %] 97 % (02/28 0814) Weight:  [113.9 kg-118.3 kg] 113.9 kg (02/28 0341) Last BM Date: 09/11/20  Weight change: Filed Weights   09/12/20 1125 09/13/20 0341  Weight: 118.3 kg 113.9 kg    Intake/Output:   Intake/Output Summary (Last 24 hours) at 09/13/2020 1000 Last data filed at 09/13/2020 0400 Gross per 24 hour  Intake 699.36 ml  Output --  Net 699.36 ml      Physical Exam    General:  Well appearing. No resp difficulty HEENT: normal Neck: supple. JVP . Carotids 2+ bilat; no bruits. No lymphadenopathy or thyromegaly appreciated. Cor: PMI nondisplaced. Regular rate & rhythm. No rubs, gallops or murmurs. Lungs: clear Abdomen: soft, nontender, nondistended. No hepatosplenomegaly. No bruits or masses. Good bowel sounds. Extremities: no cyanosis, clubbing, rash, edema Neuro: alert & orientedx3, cranial nerves grossly intact. moves all 4 extremities w/o difficulty. Affect pleasant   Telemetry   Atypical flutter rate 70s (personally reviewed)  EKG    Atypical atrial flutter (personally reviewed)  Labs   Basic Metabolic Panel: Recent Labs  Lab 09/07/20 1004 09/07/20 1015 09/08/20 2139 09/09/20 0236 09/09/20 1141 09/10/20 0132 09/11/20 0235 09/12/20 1145 09/13/20 0201  NA 137   < > 136 138  --  137 136 140 139  K 4.1   < > 3.4* 4.3  --  3.6 3.9 4.2 3.7  CL 97*   < > 99 101  --  100 98 99 102  CO2 25   < > 25 27  --  26 29 27 26   GLUCOSE 232*   < > 168* 161*  --  177* 165* 170*  197*  BUN 27*   < >  16 14  --  12 13 18 16   CREATININE 1.13*   < > 0.79 0.90  --  0.95 1.01* 1.06* 1.18*  CALCIUM 9.0   < > 8.0* 8.1*  --  8.3* 8.6* 8.5* 8.8*  MG 2.3  --  2.1  --  2.2  --   --  2.3  --   PHOS 4.0  --   --   --   --   --   --   --   --    < > = values in this interval not displayed.    Liver Function Tests: Recent Labs  Lab 09/07/20 1004  AST 32  ALT 22  ALKPHOS 40  BILITOT 0.7  PROT 7.1  ALBUMIN 3.6   No results for input(s): LIPASE, AMYLASE in the last 168 hours. No results for input(s): AMMONIA in the last 168 hours.  CBC: Recent Labs  Lab 09/07/20 1004 09/07/20 1015 09/12/20 1145 09/13/20 0201  WBC 8.1  --  8.5 7.7  NEUTROABS 5.2  --  5.5  --   HGB 16.7* 17.7* 14.9 14.3  HCT 50.3* 52.0* 45.2 43.9  MCV 93.8  --  93.4 93.6  PLT 317  --  298 274    Cardiac Enzymes: No results for input(s): CKTOTAL, CKMB, CKMBINDEX, TROPONINI in the last 168 hours.  BNP: BNP (last 3 results) Recent Labs    09/07/20 1004  BNP 85.9    ProBNP (last 3 results) No results for input(s): PROBNP in the last 8760 hours.   CBG: Recent Labs  Lab 09/08/20 1402 09/08/20 2115 09/12/20 1637 09/12/20 2159 09/13/20 0810  GLUCAP 141* 141* 184* 151* 222*    Coagulation Studies: Recent Labs    09/12/20 1145  LABPROT 15.0  INR 1.2     Imaging   DG Chest Port 1 View  Result Date: 09/12/2020 CLINICAL DATA:  80 year old female with history of syncope. EXAM: PORTABLE CHEST 1 VIEW COMPARISON:  Chest x-ray 09/07/2020. FINDINGS: Lung volumes are normal. No consolidative airspace disease. No pleural effusions. No pneumothorax. No suspicious appearing pulmonary nodules or masses are noted. Cephalization of the pulmonary vasculature, without frank pulmonary edema. Mild cardiomegaly. Upper mediastinal contours are within normal limits. Aortic atherosclerosis. IMPRESSION: 1. Cardiomegaly with pulmonary venous congestion, but no frank pulmonary edema. 2. Aortic  atherosclerosis. Electronically Signed   By: Vinnie Langton M.D.   On: 09/12/2020 12:24      Medications:     Current Medications: . apixaban  5 mg Oral BID  . fenofibrate  160 mg Oral Daily  . furosemide  60 mg Oral BID  . icosapent Ethyl  2 g Oral BID  . insulin aspart  0-15 Units Subcutaneous TID WC  . metoprolol succinate  25 mg Oral QHS  . multivitamin with minerals  1 tablet Oral Daily  . potassium chloride  20 mEq Oral BID  . potassium chloride  20 mEq Oral Once  . pravastatin  80 mg Oral q1800  . sacubitril-valsartan  1 tablet Oral BID     Infusions: . amiodarone 30 mg/hr (09/12/20 2025)       Assessment/Plan   1. 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 This was not the typical Tikosyn-induced polymorphic VT. No changes in Tikosyn dosing prior to that admission and no new meds.  cMRI showed subendocardial LGE in the basal to mid inferolateral wall. This appeared to be a coronary disease pattern however coronary angioram did not show  significant coronary disease.  She was transitioned from Tikosyn to amiodarone.  She is readmitted with recurrent presyncope.  Episodes do not seem orthostatic, she was sitting at a desk and had symptoms 3 times for about 30 seconds each. No VT overnight on amiodarone gtt.  BP is stable.  - Check orthostatics just to make sure this is not contributing.  - Continue amiodarone gtt.  - EP to see today.  We discussed ICD at last admission but thought to be in a gray zone for this, has not actually passed out.  2. Atrial fibrillation/flutter: Paroxysmal. 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 She was admitted in 1/18 with atrial fibrillation/RVR and CHF. She was diuresed and had DCCV back to NSR. She had atrial fibrillation ablation in 4/18. In 5/19, she went into atrial fibrillation transiently. In 10/19, she was back in atrial fibrillation and was admitted for  Tikosyn initiation and cardioversion.Atypical atrial flutter noted in 12/21 with DCCV to NSR. Now, back in atypical atrial flutter, rate controlled.  Had planned eventual TEE-guided DCCV.  -Continue Eliquis.   -If ICD not planned, will arrange to go ahead and do her TEE-guided DCCV tomorrow.  Discussed risks/benefits with patient and she agrees to procedure.  3. Claudication: Left leg claudication with absent left PT pulse. 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  4. Chronic systolic =>diastolic CHF: EF 81-01% on echo in 1/18, down from 50-55% in 4/15. 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 In 2/22, cMRI showed LV EF 40% with RV E 38%, inferolateral subendocardial LGE.NYHA class II symptoms, she is not volume overloadedbyexam and creatinine stable.Her presyncope symptoms do not seem orthostatic in nature and BP has not been significantly low.  - Continue Lasix 60 mg po bid (home dose).  - Continue Toprol XL 25 mg daily.   -Continue Entresto 24/26 bid today.  - ?rash from spironolactone, had been on eplerenone 25 mg daily (not available on formulary, restart after discharge as long as BP stable).  - Had been on dapagliflozin as well, can likely restart as long as BP remains stable.  5. CAD: Coronary CTAin 2018showed coronary calcium score in the 82nd percentile with nonobstructive coronary disease. Cath in 2/22 showed only luminal irregularities.  - not on ASA 81 as she is taking Eliqius.  - ContinueVascepa and Livalo.  6. COPD:History of smoking.  7. OSA: CPAP.  Length of Stay: 0  [name], MD  09/13/2020, 10:00 AM  Advanced Heart Failure Team Pager [phone] (M-F; 7a - 4p)  Please contact [facility] for night-coverage after hours (4p -7a ) and weekends on [email or web address]

## 2020-09-13 NOTE — Consult Note (Addendum)
Cardiology Consultation:   Patient ID: Heidi Stark MRN: [id number]; DOB: 1940-07-30  Admit date: 09/12/2020 Date of Consult: 09/13/2020  PCP:  Heidi Held, DO   [facility]  Cardiologist/AHF: Dr. [name] Electrophysiologist:  Heidi [name], MD   :[id number]}    Patient Profile:   Heidi Stark is a 80 y.o. female with a hx of HTN, HLD, DM, AFib (w/hx of splenic and renal infartcs), PVD (known occl L external iliac artery), chronic CHF (systolic),  COPD (smoking), OSA w/CPAP who is being seen today for the evaluation of recurrent near syncope/dizziness at the request of Dr. Gasper Sells   AFib Hx Diagnosed 2015 PVI ablation 2018  AAD Hx Tikosyn started 2019 >> stopped Feb 2022 2/2 VT  History of Present Illness:   [name] was just admitted to [facility] 09/07/20 for about a week of recurrent dizzy spells, near syncope, EMS found observed her develope a WCT that was treated initially with vagal maneuvers given no LOC unsuccessful though eventually did spontaneously convert to SR developed again a WCT with amio bolus >> AFlutter. EP was consulted, was velt to be VT and while not traditionally PMVT associated with Tikosyn, recommended stopping and transition to amiodarone. She underwent cath with only luminal irregularities, TTE LVEF 50-55% C.MRI IMPRESSION: 1. Normal LV size with thin/akinetic basal to mid inferolateral wall, EF 40%. 2.  Normal RV size with mildly decreased systolic function, EF 06%. 3. Delayed enhancement imaging showed subendocardial LGE in the basal to mid inferolateral wall. This appears to be a coronary disease pattern, but no CAD was seen on recent cath. Technically difficult study due to difficulty with breath-holding.  Patient did well, with no recurrent symptoms or VT, remained in rate controlled AFlutter. BP was low and her entresto/metoprolol Stark Discharged 09/11/20 on lower doses of both, her epleronone Stark,  and started on PO amiodarone load Recommended no driving 53mo and plans for DCCV in about 3 weeks on amio   She returned to [facility] yesterday with recurrent dizzy /near syncopal spells,, on arrival to the ER she was started on amiodarone gtt and admitted home metoprolol continued  LABS K+ 4.2 > 3.7 BUN/Creat 16/1.18 (up some from her baseline) Mag 2.3 WBC 7.7 H?H 14/43 Plts 274  HS Trop 13 > 15  She feels quite well this AM, no ongoing dizzy spells since her No CP or SOB  Past Medical History:  Diagnosis Date  . A-fib ([place])   . Arthritis    "hands, wrists" (11/07/2016)  . CHF (congestive heart failure) ([place])   . Chronic lower back pain   . Constipation   . COPD (chronic obstructive pulmonary disease) ([place])   . DVT (deep venous thrombosis) ([place])   . Dyspnea   . History of blood transfusion 1990   "related to OR"  . History of kidney stones   . Hyperlipidemia   . Hypertension   . Joint pain   . Lower extremity edema   . OSA on CPAP   . Persistent atrial fibrillation ([place])   . Pneumonia    "several times" (11/07/2016)  . Type II diabetes mellitus ([place])     Past Surgical History:  Procedure Laterality Date  . ATRIAL FIBRILLATION ABLATION N/A 11/07/2016   Procedure: Atrial Fibrillation Ablation;  Surgeon: Heidi [name], MD;  Location: [place] [facility];  Service: Cardiovascular;  Laterality: N/A;  . BACK SURGERY    . CARDIAC CATHETERIZATION    . CARDIOVERSION N/A  08/11/2016   Procedure: CARDIOVERSION;  Surgeon: Larey Dresser, MD;  Location: [facility] ENDOSCOPY;  Service: Cardiovascular;  Laterality: N/A;  . CARDIOVERSION N/A 12/03/2017   Procedure: CARDIOVERSION;  Surgeon: Larey Dresser, MD;  Location: [facility] ENDOSCOPY;  Service: Cardiovascular;  Laterality: N/A;  . CARDIOVERSION N/A 05/02/2018   Procedure: CARDIOVERSION;  Surgeon: Pixie Casino, MD;  Location: [facility] ENDOSCOPY;  Service: Cardiovascular;  Laterality: N/A;  . CARDIOVERSION N/A 06/23/2020   Procedure:  CARDIOVERSION;  Surgeon: [name], MD;  Location: [facility] ENDOSCOPY;  Service: Cardiovascular;  Laterality: N/A;  . CARPAL TUNNEL RELEASE    . CATARACT EXTRACTION W/ INTRAOCULAR LENS  IMPLANT, BILATERAL Bilateral   . COLON SURGERY  1990   vein graft and colon repair after nicked artery with back surgert  . COLONOSCOPY    . CORONARY ANGIOGRAPHY N/A 09/08/2020   Procedure: CORONARY ANGIOGRAPHY;  Surgeon: Larey Dresser, MD;  Location: [place] [facility];  Service: Cardiovascular;  Laterality: N/A;  . CYSTECTOMY     between bladder and kidneys  . GANGLION CYST EXCISION Left   . KNEE CARTILAGE SURGERY Right 1960s  . [place] SURGERY  1990  . REPAIR ILIAC ARTERY  1990   vein graft and colon repair after nicked artery with back surgert  . TEE WITHOUT CARDIOVERSION N/A 10/31/2013   Procedure: TRANSESOPHAGEAL ECHOCARDIOGRAM (TEE);  Surgeon: Larey Dresser, MD;  Location: [place];  Service: Cardiovascular;  Laterality: N/A;  . TUBAL LIGATION       Home Medications:  Prior to Admission medications   Medication Sig Start Date End Date Taking? Authorizing Provider  acetaminophen (TYLENOL) 500 MG tablet Take 500 mg by mouth every 6 (six) hours as needed for moderate pain or headache.   Yes [provider]  amiodarone (PACERONE) 400 MG tablet Take 400mg  twice daily for 2 weeks. Then on 09/24/2020, transition to 400mg  daily. Patient taking differently: Take 400 mg by mouth 2 (two) times daily. Take 400mg  twice daily for 2 weeks. Then on 09/24/2020, transition to 400mg  daily. 09/11/20  Yes Sande Rives E, PA-C  Dapagliflozin-metFORMIN HCl ER (XIGDUO XR) 11-998 MG TB24 Take 1 tablet by mouth in the morning and at bedtime.   Yes [provider]  ELIQUIS 5 MG TABS tablet TAKE 1 TABLET BY MOUTH TWICE A DAY Patient taking differently: Take 5 mg by mouth 2 (two) times daily. 02/13/20  Yes Larey Dresser, MD  fenofibrate (TRICOR) 145 MG tablet TAKE 1 TABLET BY MOUTH EVERY  DAY Patient taking differently: Take 145 mg by mouth at bedtime. 02/13/20  Yes Larey Dresser, MD  furosemide (LASIX) 40 MG tablet Take 1.5 tablets (60 mg total) by mouth 2 (two) times daily. 01/29/20  Yes Larey Dresser, MD  insulin glargine (LANTUS) 100 UNIT/ML injection Inject 15 Units into the skin at bedtime.    Yes [provider]  Insulin Pen Needle (BD PEN NEEDLE NANO U/F) 32G X 4 MM MISC See admin instructions.   Yes [provider]  LIVALO 4 MG TABS TAKE 1 TABLET BY MOUTH EVERY DAY Patient taking differently: Take 4 mg by mouth daily. 06/14/20  Yes Camnitz, Ocie Doyne, MD  metoprolol succinate (TOPROL-XL) 25 MG 24 hr tablet Take 1 tablet (25 mg total) by mouth at bedtime. 09/11/20  Yes Sande Rives E, PA-C  Multiple Vitamin (MULTIVITAMIN WITH MINERALS) TABS tablet Take 1 tablet by mouth daily.   Yes [provider]  potassium chloride (KLOR-CON M10) 10 MEQ  tablet Take 2 tablets (20 mEq total) by mouth 2 (two) times daily. 04/07/20  Yes Larey Dresser, MD  sacubitril-valsartan (ENTRESTO) 24-26 MG Take 1 tablet by mouth 2 (two) times daily. 09/11/20  Yes Goodrich, Callie E, PA-C  VASCEPA 1 g capsule TAKE 2 CAPSULES BY MOUTH TWICE A DAY Patient taking differently: Take 2,000 g by mouth 2 (two) times daily. 08/06/20  Yes Larey Dresser, MD    Inpatient Medications: Scheduled Meds: . apixaban  5 mg Oral BID  . fenofibrate  160 mg Oral Daily  . furosemide  60 mg Oral BID  . icosapent Ethyl  2 g Oral BID  . insulin aspart  0-15 Units Subcutaneous TID WC  . metoprolol succinate  25 mg Oral QHS  . multivitamin with minerals  1 tablet Oral Daily  . potassium chloride  20 mEq Oral BID  . pravastatin  80 mg Oral q1800  . sacubitril-valsartan  1 tablet Oral BID   Continuous Infusions: . amiodarone 30 mg/hr (09/12/20 2025)   PRN Meds: acetaminophen, nitroGLYCERIN, ondansetron (ZOFRAN) IV  Allergies:    Allergies  Allergen Reactions  .  Neomycin-Bacitracin Zn-Polymyx Rash  . Sulfonamide Derivatives Other (See Comments)    Stomach cramps  . Ciprofloxacin Hives, Itching and Rash  . Spironolactone Rash    Social History:   Social History   Socioeconomic History  . Marital status: Married    Spouse name: [name]  . Number of children: 2  . Years of education: Not on file  . Highest education level: Not on file  Occupational History  . Occupation: Retired  Tobacco Use  . Smoking status: Former Smoker    Packs/day: 1.00    Years: 50.00    Pack years: 50.00    Types: Cigarettes  . Smokeless tobacco: Never Used  Vaping Use  . Vaping Use: Never used  Substance and Sexual Activity  . Alcohol use: No    Alcohol/week: 0.0 standard drinks  . Drug use: No  . Sexual activity: Not on file  Other Topics Concern  . Not on file  Social History Narrative  . Not on file   Social Determinants of Health   Financial Resource Strain: Not on file  Food Insecurity: Not on file  Transportation Needs: Not on file  Physical Activity: Not on file  Stress: Not on file  Social Connections: Not on file  Intimate Partner Violence: Not on file    Family History:   Family History  Problem Relation Age of Onset  . Diabetes Mother   . Hypertension Mother   . Cancer Mother   . Obesity Mother   . Diabetes Father   . Obesity Father   . Diabetes Other   . Melanoma Other   . Factor V Leiden deficiency Daughter      ROS:  Please see the history of present illness. All other ROS reviewed and negative.     Physical Exam/Data:   Vitals:   09/12/20 1530 09/12/20 1601 09/12/20 2006 09/13/20 0341  BP: (!) 112/53 117/65 105/71 124/78  Pulse: 85 78 85 86  Resp: 15 16 18 16   Temp:  97.7 F (36.5 C) 98.1 F (36.7 C) 98.1 F (36.7 C)  TempSrc:  Oral Oral Oral  SpO2: 99% 99% 93% 95%  Weight:    113.9 kg  Height:        Intake/Output Summary (Last 24 hours) at 09/13/2020 0803 Last data filed at 09/13/2020 0400 Gross per 24  hour  Intake 699.36 ml  Output --  Net 699.36 ml   Last 3 Weights 09/13/2020 09/12/2020 09/11/2020  Weight (lbs) 251 lb 1.6 oz 260 lb 12.9 oz 253 lb 2.5 oz  Weight (kg) 113.898 kg 118.3 kg 114.83 kg     Body mass index is 40.53 kg/m.  General:  Well nourished, well developed, in no acute distress HEENT: normal Lymph: no adenopathy Neck: no JVD Endocrine:  No thryomegaly Vascular: No carotid bruits  Cardiac:  irreg-irreg; no murmurs, gallops or rubs Lungs:  CTA b/l, no wheezing, rhonchi or rales  Abd: soft, nontender Ext: no edema, chronic looking skin changes Musculoskeletal:  No deformities Skin: warm and dry  Neuro:   no focal abnormalities noted Psych:  Normal affect   EKG:  The EKG was personally reviewed and demonstrates:    AFlutter 86bpm, no changes  Telemetry:  Telemetry was personally reviewed and demonstrates:   AF;utter, rate controlled   Relevant CV Studies:  09/09/20 c.MRI IMPRESSION: 1. Normal LV size with thin/akinetic basal to mid inferolateral wall, EF 40%.  2.  Normal RV size with mildly decreased systolic function, EF 38%.  3. Delayed enhancement imaging showed subendocardial LGE in the basal to mid inferolateral wall. This appears to be a coronary disease pattern, but no CAD was seen on recent cath.  Technically difficult study due to difficulty with breath-holding.   09/08/20: LHC Left Main  No significant disease.  Left Anterior Descending  Luminal irregularities.  Left Circumflex  Luminal irregularities.  Right Coronary Artery  Luminal irregularities.     09/07/20: TTE IMPRESSIONS  1. Left ventricular ejection fraction, by estimation, is 50 to 55%. The  left ventricle has low normal function. The left ventricle has no regional  wall motion abnormalities. Left ventricular diastolic parameters are  indeterminate.  2. Right ventricular systolic function is normal. The right ventricular  size is normal. There is normal pulmonary  artery systolic pressure.  3. Left atrial size was mild to moderately dilated.  4. Right atrial size was mildly dilated.  5. A small pericardial effusion is present.  6. The mitral valve is abnormal. Trivial mitral valve regurgitation.  Moderate mitral annular calcification.  7. The aortic valve is grossly normal. Aortic valve regurgitation is not  visualized. No aortic stenosis is present.  8. The inferior vena cava is normal in size with greater than 50%  respiratory variability, suggesting right atrial pressure of 3 mmHg.   Comparison(s): No significant change from prior study.   Laboratory Data:  High Sensitivity Troponin:   Recent Labs  Lab 09/07/20 1004 09/07/20 1210 09/12/20 1145 09/12/20 1403  TROPONINIHS 13 12 13 15      Chemistry Recent Labs  Lab 09/11/20 0235 09/12/20 1145 09/13/20 0201  NA 136 140 139  K 3.9 4.2 3.7  CL 98 99 102  CO2 29 27 26   GLUCOSE 165* 170* 197*  BUN 13 18 16   CREATININE 1.01* 1.06* 1.18*  CALCIUM 8.6* 8.5* 8.8*  GFRNONAA 57* 54* 47*  ANIONGAP 9 14 11     Recent Labs  Lab 09/07/20 1004  PROT 7.1  ALBUMIN 3.6  AST 32  ALT 22  ALKPHOS 40  BILITOT 0.7   Hematology Recent Labs  Lab 09/07/20 1004 09/07/20 1015 09/12/20 1145 09/13/20 0201  WBC 8.1  --  8.5 7.7  RBC 5.36*  --  4.84 4.69  HGB 16.7* 17.7* 14.9 14.3  HCT 50.3* 52.0* 45.2 43.9  MCV 93.8  --  93.4 93.6  MCH  31.2  --  30.8 30.5  MCHC 33.2  --  33.0 32.6  RDW 15.6*  --  15.1 15.0  PLT 317  --  298 274   BNP Recent Labs  Lab 09/07/20 1004  BNP 85.9    DDimer No results for input(s): DDIMER in the last 168 hours.   Radiology/Studies:  DG Chest Port 1 View Result Date: 09/12/2020 CLINICAL DATA:  80 year old female with history of syncope. EXAM: PORTABLE CHEST 1 VIEW COMPARISON:  Chest x-ray 09/07/2020. FINDINGS: Lung volumes are normal. No consolidative airspace disease. No pleural effusions. No pneumothorax. No suspicious appearing pulmonary nodules  or masses are noted. Cephalization of the pulmonary vasculature, without frank pulmonary edema. Mild cardiomegaly. Upper mediastinal contours are within normal limits. Aortic atherosclerosis. IMPRESSION: 1. Cardiomegaly with pulmonary venous congestion, but no frank pulmonary edema. 2. Aortic atherosclerosis. Electronically Signed   By: Vinnie Langton M.D.   On: 09/12/2020 12:24     Assessment and Plan:   1. VT (noted last visit) On amiodarone gtt Infrequent PVCs, rare couplet Replace K+ Likely scar mediated ? ICD Continue amio gtt today, if no  VT, transition to PO tomorrow  2. Persistent Atrial Fibrillation/Flutter  CHA2DS2Vasc is 7 On Eliquis, appropriately dosed  3. Chronic Combined CHF  BP tolerating current regime BB entresto Lasix Vescepa Watch I/O Does not appear volume OL  4. Non-Obstructive CAD  INOCA with CAD scar BB Statin/fenofibrate  5. COPD  PFTs outpt with amio addition on no home meds Urged to quit smoking   Risk Assessment/Risk Scores:  { For questions or updates, please contact [place] Please consult [email or web address] for contact info under    Signed, Baldwin [place], PA-C  09/13/2020 8:03 AM  Atrial fibrillation/atypical flutter-persistent  Ventricular tachycardia/flutter i.e. very rapid with presyncope  Recurrent presyncope presumed ventricular tachycardia  Cardiomyopathy-nonischemic however, dense scar?  Mechanism  Congestive heart failure-chronic-systolic/diastolic  High risk medication-amiodarone   The patient has ventricular flutter/fast VT with presyncope occurring in the context previously of dofetilide therapy now with recurrent symptoms despite that drug being washed out.--  With her LV dysfunction, amiodarone is an appropriate therapy for suppression of her recurrent ventricular arrhythmia.  The role of an ICD is broached again with recurrent symptoms and in the context of her cardiomyopathy with persistent substrate  for ventricular arrhythmias.  We Heidi require adjunctive antiarrhythmic therapy given the frequency of the events but I think it is also reasonable with recurring events in the very fast tachycardia to undertake ICD implantation for secondary prevention  I have reviewed this with Dr. DM and Dr. Carlyn Reichert and have discussed it with the patient and her daughter extensively.  We Heidi plan to continue to load with amiodarone for another 72+ hours to give adequate time for observation and   ICD implantation just prior to anticipated discharge.  Continue anticoagulation - on Apixoban    Euvolemic continue current meds

## 2020-09-13 NOTE — Progress Notes (Signed)
Inpatient Diabetes Program Recommendations  AACE/ADA: New Consensus Statement on Inpatient Glycemic Control (2015)  Target Ranges:  Prepandial:   less than 140 mg/dL      Peak postprandial:   less than 180 mg/dL (1-2 hours)      Critically ill patients:  140 - 180 mg/dL   Lab Results  Component Value Date   GLUCAP 301 (H) 09/13/2020   HGBA1C 8.1 (H) 09/12/2020    Review of Glycemic Control Results for ANWITHA, MAPES (MRN [id number]) as of 09/13/2020 15:11  Ref. Range 09/12/2020 21:59 09/13/2020 08:10 09/13/2020 11:37 09/13/2020 13:37  Glucose-Capillary Latest Ref Range: 70 - 99 mg/dL 151 (H) 222 (H) 325 (H) 301 (H)   Diabetes history: Type 2 DM Outpatient Diabetes medications: Xigduo 11-998 mg BID, Lantus 15 units QHS Current orders for Inpatient glycemic control: Novolog 0-15 units TID  Inpatient Diabetes Program Recommendations:    Consider adding Lantus 10 units QHS.   Thanks, Bronson Curb, MSN, RNC-OB Diabetes Coordinator [phone] (8a-5p)

## 2020-09-14 ENCOUNTER — Encounter (HOSPITAL_COMMUNITY)
Admission: EM | Disposition: A | Discharge status: Home / Self Care | Payer: Self-pay | Source: Home / Self Care | Attending: Cardiology

## 2020-09-14 DIAGNOSIS — M545 Low back pain, unspecified: Secondary | ICD-10-CM | POA: Diagnosis present

## 2020-09-14 DIAGNOSIS — G4733 Obstructive sleep apnea (adult) (pediatric): Secondary | ICD-10-CM | POA: Diagnosis present

## 2020-09-14 DIAGNOSIS — I110 Hypertensive heart disease with heart failure: Secondary | ICD-10-CM | POA: Diagnosis present

## 2020-09-14 DIAGNOSIS — Z833 Family history of diabetes mellitus: Secondary | ICD-10-CM | POA: Diagnosis not present

## 2020-09-14 DIAGNOSIS — I472 Ventricular tachycardia: Secondary | ICD-10-CM | POA: Diagnosis present

## 2020-09-14 DIAGNOSIS — Z87891 Personal history of nicotine dependence: Secondary | ICD-10-CM | POA: Diagnosis not present

## 2020-09-14 DIAGNOSIS — Z7901 Long term (current) use of anticoagulants: Secondary | ICD-10-CM | POA: Diagnosis not present

## 2020-09-14 DIAGNOSIS — Z87442 Personal history of urinary calculi: Secondary | ICD-10-CM | POA: Diagnosis not present

## 2020-09-14 DIAGNOSIS — I428 Other cardiomyopathies: Secondary | ICD-10-CM | POA: Diagnosis present

## 2020-09-14 DIAGNOSIS — Z6841 Body Mass Index (BMI) 40.0 and over, adult: Secondary | ICD-10-CM | POA: Diagnosis not present

## 2020-09-14 DIAGNOSIS — Z86718 Personal history of other venous thrombosis and embolism: Secondary | ICD-10-CM | POA: Diagnosis not present

## 2020-09-14 DIAGNOSIS — I5023 Acute on chronic systolic (congestive) heart failure: Secondary | ICD-10-CM | POA: Diagnosis present

## 2020-09-14 DIAGNOSIS — N179 Acute kidney failure, unspecified: Secondary | ICD-10-CM | POA: Diagnosis not present

## 2020-09-14 DIAGNOSIS — Z808 Family history of malignant neoplasm of other organs or systems: Secondary | ICD-10-CM | POA: Diagnosis not present

## 2020-09-14 DIAGNOSIS — Z961 Presence of intraocular lens: Secondary | ICD-10-CM | POA: Diagnosis present

## 2020-09-14 DIAGNOSIS — Z8249 Family history of ischemic heart disease and other diseases of the circulatory system: Secondary | ICD-10-CM | POA: Diagnosis not present

## 2020-09-14 DIAGNOSIS — Z9842 Cataract extraction status, left eye: Secondary | ICD-10-CM | POA: Diagnosis not present

## 2020-09-14 DIAGNOSIS — I5042 Chronic combined systolic (congestive) and diastolic (congestive) heart failure: Secondary | ICD-10-CM | POA: Diagnosis present

## 2020-09-14 DIAGNOSIS — E1151 Type 2 diabetes mellitus with diabetic peripheral angiopathy without gangrene: Secondary | ICD-10-CM | POA: Diagnosis present

## 2020-09-14 DIAGNOSIS — I4819 Other persistent atrial fibrillation: Secondary | ICD-10-CM | POA: Diagnosis present

## 2020-09-14 DIAGNOSIS — Z9841 Cataract extraction status, right eye: Secondary | ICD-10-CM | POA: Diagnosis not present

## 2020-09-14 DIAGNOSIS — Z7984 Long term (current) use of oral hypoglycemic drugs: Secondary | ICD-10-CM | POA: Diagnosis not present

## 2020-09-14 DIAGNOSIS — Z20822 Contact with and (suspected) exposure to covid-19: Secondary | ICD-10-CM | POA: Diagnosis present

## 2020-09-14 DIAGNOSIS — E785 Hyperlipidemia, unspecified: Secondary | ICD-10-CM | POA: Diagnosis present

## 2020-09-14 DIAGNOSIS — J449 Chronic obstructive pulmonary disease, unspecified: Secondary | ICD-10-CM | POA: Diagnosis present

## 2020-09-14 LAB — GLUCOSE, CAPILLARY
Glucose-Capillary: 213 mg/dL — ABNORMAL HIGH (ref 70–99)
Glucose-Capillary: 147 mg/dL — ABNORMAL HIGH (ref 70–99)
Glucose-Capillary: 311 mg/dL — ABNORMAL HIGH (ref 70–99)

## 2020-09-14 LAB — BASIC METABOLIC PANEL
Anion gap: 15 (ref 5–15)
BUN: 16 mg/dL (ref 8–23)
CO2: 25 mmol/L (ref 22–32)
Calcium: 9 mg/dL (ref 8.9–10.3)
Chloride: 94 mmol/L — ABNORMAL LOW (ref 98–111)
Creatinine, Ser: 1.01 mg/dL — ABNORMAL HIGH (ref 0.44–1.00)
GFR, Estimated: 57 mL/min — ABNORMAL LOW (ref >60)
Glucose, Bld: 311 mg/dL — ABNORMAL HIGH (ref 70–99)
Potassium: 4 mmol/L (ref 3.5–5.1)
Sodium: 134 mmol/L — ABNORMAL LOW (ref 135–145)

## 2020-09-14 LAB — MAGNESIUM: Magnesium: 2.2 mg/dL (ref 1.7–2.4)

## 2020-09-14 LAB — SURGICAL PCR SCREEN
MRSA, PCR: NEGATIVE
Staphylococcus aureus: NEGATIVE

## 2020-09-14 SURGERY — ECHOCARDIOGRAM, TRANSESOPHAGEAL
Anesthesia: General

## 2020-09-14 MED ORDER — CEFAZOLIN SODIUM-DEXTROSE 2-4 GM/100ML-% IV SOLN: 2.0000 g | INTRAVENOUS | Status: DC

## 2020-09-14 MED ORDER — CHLORHEXIDINE GLUCONATE 4 % EX LIQD
60.0000 mL | Freq: Once | CUTANEOUS | Status: AC
  Administered 2020-09-15: 4 via TOPICAL
  Filled 2020-09-14: qty 60

## 2020-09-14 MED ORDER — INSULIN GLARGINE 100 UNIT/ML ~~LOC~~ SOLN
15.0000 [IU] | Freq: Every day | SUBCUTANEOUS | Status: DC
  Administered 2020-09-14 – 2020-09-15 (×2): 15 [IU] via SUBCUTANEOUS
  Filled 2020-09-14 (×3): qty 0.15

## 2020-09-14 MED ORDER — SODIUM CHLORIDE 0.9 % IV SOLN
80.0000 mg | INTRAVENOUS | Status: AC
  Administered 2020-09-15: 80 mg

## 2020-09-14 MED ORDER — SODIUM CHLORIDE 0.9 % IV SOLN: INTRAVENOUS | Status: DC

## 2020-09-14 MED ORDER — SODIUM CHLORIDE 0.9 % IV SOLN: 250.0000 mL | INTRAVENOUS | Status: DC

## 2020-09-14 MED ORDER — INSULIN ASPART 100 UNIT/ML ~~LOC~~ SOLN
0.0000 [IU] | Freq: Three times a day (TID) | SUBCUTANEOUS | Status: DC
  Administered 2020-09-14: 5 [IU] via SUBCUTANEOUS
  Administered 2020-09-15 (×2): 3 [IU] via SUBCUTANEOUS
  Administered 2020-09-16: 11 [IU] via SUBCUTANEOUS

## 2020-09-14 MED ORDER — APIXABAN 5 MG PO TABS
5.0000 mg | ORAL_TABLET | Freq: Two times a day (BID) | ORAL | Status: AC
  Administered 2020-09-14: 5 mg via ORAL
  Filled 2020-09-14 (×2): qty 1

## 2020-09-14 MED ORDER — INSULIN ASPART 100 UNIT/ML ~~LOC~~ SOLN
0.0000 [IU] | Freq: Every day | SUBCUTANEOUS | Status: DC
  Administered 2020-09-14: 4 [IU] via SUBCUTANEOUS
  Administered 2020-09-15: 2 [IU] via SUBCUTANEOUS

## 2020-09-14 MED ORDER — SODIUM CHLORIDE 0.9 % IV SOLN: 80.0000 mg | INTRAVENOUS | Status: DC

## 2020-09-14 MED ORDER — DAPAGLIFLOZIN PROPANEDIOL 10 MG PO TABS
10.0000 mg | ORAL_TABLET | Freq: Every day | ORAL | Status: DC
  Administered 2020-09-14 – 2020-09-16 (×3): 10 mg via ORAL
  Filled 2020-09-14 (×3): qty 1

## 2020-09-14 MED ORDER — SODIUM CHLORIDE 0.9% FLUSH
3.0000 mL | INTRAVENOUS | Status: DC | PRN
  Administered 2020-09-14: 3 mL via INTRAVENOUS

## 2020-09-14 MED ORDER — POLYETHYLENE GLYCOL 3350 17 G PO PACK
17.0000 g | PACK | Freq: Once | ORAL | Status: AC
  Administered 2020-09-14: 17 g via ORAL
  Filled 2020-09-14: qty 1

## 2020-09-14 MED ORDER — CEFAZOLIN SODIUM-DEXTROSE 2-4 GM/100ML-% IV SOLN
2.0000 g | INTRAVENOUS | Status: AC
  Administered 2020-09-15: 2 g via INTRAVENOUS

## 2020-09-14 NOTE — Progress Notes (Signed)
Inpatient Diabetes Program Recommendations  AACE/ADA: New Consensus Statement on Inpatient Glycemic Control (2015)  Target Ranges:  Prepandial:   less than 140 mg/dL      Peak postprandial:   less than 180 mg/dL (1-2 hours)      Critically ill patients:  140 - 180 mg/dL   Lab Results  Component Value Date   GLUCAP 213 (H) 09/14/2020   HGBA1C 8.1 (H) 09/12/2020    Review of Glycemic Control Results for Heidi Stark, Heidi Stark (MRN [id number]) as of 09/14/2020 11:16  Ref. Range 09/13/2020 13:37 09/13/2020 16:37 09/13/2020 21:20 09/14/2020 08:00  Glucose-Capillary Latest Ref Range: 70 - 99 mg/dL 301 (H) 147 (H) 273 (H) 213 (H)   Diabetes history: Type 2 DM Outpatient Diabetes medications: Xigduo 11-998 mg BID, Lantus 15 units QHS Current orders for Inpatient glycemic control: Lantus 10 units QHS, Novolog 0-15 units TID  Inpatient Diabetes Program Recommendations:    Consider adding Novolog 0-5 units QHS and increasing Lantus to 15 units QHS (patient's home dose).  Thanks, Bronson Curb, MSN, RNC-OB Diabetes Coordinator [phone] (8a-5p)

## 2020-09-14 NOTE — Progress Notes (Addendum)
Advanced Heart Failure Rounding Note  PCP-Cardiologist: No primary care provider on file.   Subjective:    Feels ok today. No further presyncope.   Remains in rate controlled AF, 80s. No VT on tele.   Objective:   Weight Range: 113.9 kg Body mass index is 40.53 kg/m.   Vital Signs:   Temp:  [97.7 F (36.5 C)-98.5 F (36.9 C)] 97.7 F (36.5 C) (03/01 0426) Pulse Rate:  [76-86] 76 (03/01 0426) Resp:  [18-19] 19 (03/01 0426) BP: (103-115)/(51-78) 106/51 (03/01 0511) SpO2:  [94 %-98 %] 98 % (03/01 0426) Last BM Date: 09/12/20  Weight change: Filed Weights   09/12/20 1125 09/13/20 0341  Weight: 118.3 kg 113.9 kg    Intake/Output:   Intake/Output Summary (Last 24 hours) at 09/14/2020 0717 Last data filed at 09/13/2020 2000 Gross per 24 hour  Intake 360 ml  Output --  Net 360 ml      Physical Exam    General:  Well appearing, moderately obese. No resp difficulty HEENT: Normal Neck: Supple. JVP . Carotids 2+ bilat; no bruits. No lymphadenopathy or thyromegaly appreciated. Cor: PMI nondisplaced. Irregularly irregular rhythm and rate. No rubs, gallops or murmurs. Lungs: Clear Abdomen: Soft, nontender, nondistended. No hepatosplenomegaly. No bruits or masses. Good bowel sounds. Extremities: No cyanosis, clubbing, rash, edema Neuro: Alert & orientedx3, cranial nerves grossly intact. moves all 4 extremities w/o difficulty. Affect pleasant   Telemetry   Atypical atrial flutter 80s. No VT   EKG    No new EKG to review   Labs    CBC Recent Labs    09/12/20 1145 09/13/20 0201  WBC 8.5 7.7  NEUTROABS 5.5  --   HGB 14.9 14.3  HCT 45.2 43.9  MCV 93.4 93.6  PLT 298 096   Basic Metabolic Panel Recent Labs    09/12/20 1145 09/13/20 0201  NA 140 139  K 4.2 3.7  CL 99 102  CO2 27 26  GLUCOSE 170* 197*  BUN 18 16  CREATININE 1.06* 1.18*  CALCIUM 8.5* 8.8*  MG 2.3  --    Liver Function Tests No results for input(s): AST, ALT, ALKPHOS, BILITOT,  PROT, ALBUMIN in the last 72 hours. No results for input(s): LIPASE, AMYLASE in the last 72 hours. Cardiac Enzymes No results for input(s): CKTOTAL, CKMB, CKMBINDEX, TROPONINI in the last 72 hours.  BNP: BNP (last 3 results) Recent Labs    09/07/20 1004  BNP 85.9    ProBNP (last 3 results) No results for input(s): PROBNP in the last 8760 hours.   D-Dimer No results for input(s): DDIMER in the last 72 hours. Hemoglobin A1C Recent Labs    09/12/20 1622  HGBA1C 8.1*   Fasting Lipid Panel No results for input(s): CHOL, HDL, LDLCALC, TRIG, CHOLHDL, LDLDIRECT in the last 72 hours. Thyroid Function Tests No results for input(s): TSH, T4TOTAL, T3FREE, THYROIDAB in the last 72 hours.  Invalid input(s): FREET3  Other results:   Imaging     No results found.   Medications:     Scheduled Medications: . amiodarone  400 mg Oral TID  . fenofibrate  160 mg Oral Daily  . furosemide  60 mg Oral BID  . icosapent Ethyl  2 g Oral BID  . insulin aspart  0-15 Units Subcutaneous TID WC  . insulin glargine  10 Units Subcutaneous QHS  . metoprolol succinate  25 mg Oral QHS  . multivitamin with minerals  1 tablet Oral Daily  . potassium chloride  20 mEq Oral BID  . pravastatin  80 mg Oral q1800  . sacubitril-valsartan  1 tablet Oral BID  . sodium chloride flush  3 mL Intravenous Q12H     Infusions: . sodium chloride 20 mL/hr at 09/13/20 1412     PRN Medications:  acetaminophen, nitroGLYCERIN, ondansetron (ZOFRAN) IV    Assessment/Plan    1. 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 This was not the typical Tikosyn-induced polymorphic VT. No changes in Tikosyn dosing prior to that admission and no new meds. cMRI showedsubendocardial LGE in the basal to mid inferolateral wall. This appeared to be a coronary disease patternhowevercoronary angiogram did not show significant coronary disease.  She was transitioned from Tikosyn to  amiodarone. She is readmitted with recurrent presyncope.  Episodes do not seem orthostatic, she was sitting at a desk and had symptoms 3 times for about 30 seconds each. No VT overnight.  BP is stable.  - Check orthostatics just to make sure this is not contributing.  - Continue amiodarone 400 mg tid  - EP following. Planning ICD implant tomorrow  - Check BMP and Mg level  2. Atrial fibrillation/flutter: Paroxysmal. 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 She was admitted in 1/18 with atrial fibrillation/RVR and CHF. She was diuresed and had DCCV back to NSR. She had atrial fibrillation ablation in 4/18. In 5/19, she went into atrial fibrillation transiently. In 10/19, she was back in atrial fibrillation and was admitted for Tikosyn initiation and cardioversion.Atypical atrial flutter noted in 12/21 with DCCV to NSR. As outlined above, now off Tikosyn and on amiodarone. Now, back in atypical atrial flutter, rate controlled.   -Continue amiodarone - Hold Eliquis tonight for planned ICD - Delay TEE DCCV until post ICD placement  3. Claudication: Left leg claudication with absent left PT pulse. 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  4. 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  - Continue Lasix 60 mg po bid (home dose).  - Continue Toprol XL 25 mg daily.   -Continue Entresto 24/26 bid today. - ?rash from spironolactone, had been on eplerenone 25 mg daily (not  available on formulary, restart after discharge as long as BP stable).  - Had been on dapagliflozin as well, can likely restart as long as BP remains stable.  5. CAD: Coronary CTAin 2018showed coronary calcium score in the 82nd percentile with nonobstructive coronary disease. Cathin 2/22showed only luminal irregularities.  - not on ASA 81 as she is taking Eliqius.  - ContinueVascepa and Livalo.  6. COPD:History of smoking.  7. OSA: CPAP.  Length of Stay: 0  Lyda Jester, PA-C  09/14/2020, 7:17 AM  Advanced Heart Failure Team Pager [phone] (M-F; 7a - 4p)  Please contact [facility] for night-coverage after hours (4p -7a ) and weekends on [email or web address]  Patient seen with PA, agree with the above note.   Stable today, no VT overnight.  She remains on amiodarone gtt.  She was not orthostatic when checked today.  No complaints.   General: NAD Neck: No JVD, no thyromegaly or thyroid nodule.  Lungs: Clear to auscultation bilaterally with normal respiratory effort. CV: Nondisplaced PMI.  Heart regular S1/S2, no S3/S4, no murmur.  No peripheral edema.   Abdomen: Soft, nontender, no hepatosplenomegaly, no distention.  Skin: Intact without lesions or rashes.  Neurologic: Alert and oriented x 3.  Psych: Normal affect. Extremities: No clubbing or cyanosis.  HEENT: Normal.   Discussion with Dr. Caryl Comes yesterday, given recurrent VT on amiodarone and significant scarring on cardiac MRI, she will get ICD.  This will be implanted tomorrow.  Hold Eliquis after tonight's dose.  Continue amiodarone.   Not orthostatic, can restart Farxiga 10 mg daily and will restart eplerenone next.  Not volume overloaded on exam.   [name] 09/14/2020 1:30 PM

## 2020-09-14 NOTE — Progress Notes (Addendum)
Progress Note  Patient Name: Heidi Stark Date of Encounter: 09/14/2020  [facility] Cardiologist: Dr. [name]  Subjective   Feels OK, no CP, no SOB, no dizzy spells.  Emotional about needing ICD  Inpatient Medications    Scheduled Meds: . amiodarone  400 mg Oral TID  . apixaban  5 mg Oral BID  . fenofibrate  160 mg Oral Stark  . furosemide  60 mg Oral BID  . icosapent Ethyl  2 g Oral BID  . insulin aspart  0-15 Units Subcutaneous TID WC  . insulin glargine  10 Units Subcutaneous QHS  . metoprolol succinate  25 mg Oral QHS  . multivitamin with minerals  1 tablet Oral Stark  . potassium chloride  20 mEq Oral BID  . pravastatin  80 mg Oral q1800  . sacubitril-valsartan  1 tablet Oral BID  . sodium chloride flush  3 mL Intravenous Q12H   Continuous Infusions: . sodium chloride 20 mL/hr at 09/13/20 1412   PRN Meds: acetaminophen, nitroGLYCERIN, ondansetron (ZOFRAN) IV   Vital Signs    Vitals:   09/13/20 1640 09/13/20 2041 09/14/20 0426 09/14/20 0511  BP: 115/78 112/67  (!) 106/51  Pulse: 80 86 76   Resp: 18 18 19    Temp: 98.4 F (36.9 C) 98.5 F (36.9 C) 97.7 F (36.5 C)   TempSrc: Oral Oral Oral   SpO2: 96% 94% 98%   Weight:      Height:        Intake/Output Summary (Last 24 hours) at 09/14/2020 0846 Last data filed at 09/13/2020 2000 Gross per 24 hour  Intake 360 ml  Output --  Net 360 ml   Last 3 Weights 09/13/2020 09/12/2020 09/11/2020  Weight (lbs) 251 lb 1.6 oz 260 lb 12.9 oz 253 lb 2.5 oz  Weight (kg) 113.898 kg 118.3 kg 114.83 kg      Telemetry    AFlutter, rate controlled - Personally Reviewed  ECG    No new EKGs - Personally Reviewed  Physical Exam   GEN: No acute distress.   Neck: No JVD Cardiac: irreg-irreg, no murmurs, rubs, or gallops.  Respiratory: CTA b/l. GI: Soft, nontender, non-distended  MS: No edema; No deformity. Neuro:  Nonfocal  Psych: Normal affect   Labs    High Sensitivity Troponin:   Recent Labs  Lab  09/07/20 1004 09/07/20 1210 09/12/20 1145 09/12/20 1403  TROPONINIHS 13 12 13 15       Chemistry Recent Labs  Lab 09/07/20 1004 09/07/20 1015 09/11/20 0235 09/12/20 1145 09/13/20 0201  NA 137   < > 136 140 139  K 4.1   < > 3.9 4.2 3.7  CL 97*   < > 98 99 102  CO2 25   < > 29 27 26   GLUCOSE 232*   < > 165* 170* 197*  BUN 27*   < > 13 18 16   CREATININE 1.13*   < > 1.01* 1.06* 1.18*  CALCIUM 9.0   < > 8.6* 8.5* 8.8*  PROT 7.1  --   --   --   --   ALBUMIN 3.6  --   --   --   --   AST 32  --   --   --   --   ALT 22  --   --   --   --   ALKPHOS 40  --   --   --   --   BILITOT 0.7  --   --   --   --  GFRNONAA 50*   < > 57* 54* 47*  ANIONGAP 15   < > 9 14 11    < > = values in this interval not displayed.     Hematology Recent Labs  Lab 09/07/20 1004 09/07/20 1015 09/12/20 1145 09/13/20 0201  WBC 8.1  --  8.5 7.7  RBC 5.36*  --  4.84 4.69  HGB 16.7* 17.7* 14.9 14.3  HCT 50.3* 52.0* 45.2 43.9  MCV 93.8  --  93.4 93.6  MCH 31.2  --  30.8 30.5  MCHC 33.2  --  33.0 32.6  RDW 15.6*  --  15.1 15.0  PLT 317  --  298 274    BNP Recent Labs  Lab 09/07/20 1004  BNP 85.9     DDimer No results for input(s): DDIMER in the last 168 hours.   Radiology    DG Chest Port 1 View  Result Date: 09/12/2020 CLINICAL DATA:  80 year old female with history of syncope. EXAM: PORTABLE CHEST 1 VIEW COMPARISON:  Chest x-ray 09/07/2020. FINDINGS: Lung volumes are normal. No consolidative airspace disease. No pleural effusions. No pneumothorax. No suspicious appearing pulmonary nodules or masses are noted. Cephalization of the pulmonary vasculature, without frank pulmonary edema. Mild cardiomegaly. Upper mediastinal contours are within normal limits. Aortic atherosclerosis. IMPRESSION: 1. Cardiomegaly with pulmonary venous congestion, but no frank pulmonary edema. 2. Aortic atherosclerosis. Electronically Signed   By: Vinnie Langton M.D.   On: 09/12/2020 12:24    Cardiac Studies    C.MRI 09/13/20 IMPRESSION: 1. Normal LV size with thin/akinetic basal to mid inferolateral wall, EF 40%. 2. Normal RV size with mildly decreased systolic function, EF 75%. 3. Delayed enhancement imaging showed subendocardial LGE in the basal to mid inferolateral wall. This appears to be a coronary disease pattern, but no CAD was seen on recent cath. Technically difficult study due to difficulty with breath-holding.   09/08/20: LHC Left Main  No significant disease.  Left Anterior Descending  Luminal irregularities.  Left Circumflex  Luminal irregularities.  Right Coronary Artery  Luminal irregularities.     09/07/20: TTE IMPRESSIONS  1. Left ventricular ejection fraction, by estimation, is 50 to 55%. The  left ventricle has low normal function. The left ventricle has no regional  wall motion abnormalities. Left ventricular diastolic parameters are  indeterminate.  2. Right ventricular systolic function is normal. The right ventricular  size is normal. There is normal pulmonary artery systolic pressure.  3. Left atrial size was mild to moderately dilated.  4. Right atrial size was mildly dilated.  5. A small pericardial effusion is present.  6. The mitral valve is abnormal. Trivial mitral valve regurgitation.  Moderate mitral annular calcification.  7. The aortic valve is grossly normal. Aortic valve regurgitation is not  visualized. No aortic stenosis is present.  8. The inferior vena cava is normal in size with greater than 50%  respiratory variability, suggesting right atrial pressure of 3 mmHg.   Comparison(s): No significant change from prior study.     Patient Profile     80 y.o. female with a hx of HTN, HLD, DM, AFib (w/hx of splenic and renal infartcs), PVD (known occl L external iliac artery), chronic CHF (systolic),  COPD (smoking), OSA w/CPAP Readmitted with recurrent dissy spells and near syncope, presumed her VT again  AFib Hx Diagnosed  2015 PVI ablation 2018  AAD Hx Tikosyn started 2019 >> stopped Feb 2022 2/2 VT Amiodarone started feb 2022  Assessment & Plan    1.  VT(noted last visit) Amiodarone gtt >> PO yesterday Infrequent PVCs, rare couplet Likely scar mediated   Planned for ICD tomorrow with Dr. [name] Discussed at length yesterday Dr. [name] has seen and examined her today, revisited rational for device Discussed procedure, potential risks/benefots, she is agreeable to proceed  NPO after modnight No eliquis tonight  2. Persistent Atrial Fibrillation/Flutter CHA2DS2Vasc is 7 On Eliquis, appropriately dosed No eliquis tonight for implant  3. Chronic Combined CHF LVEF by MRI 40% BP tolerating current regime BB entresto Lasix Vescepa Watch I/O Does not appear volume OL AHF team on board  4. Non-Obstructive CAD INOCA with CAD scar BB Statin/fenofibrate  5. COPD PFTs outpt with amio addition on no home meds Urged to quit smoking No SOB  For questions or updates, please contact [facility] Please consult [email or web address] for contact info under        Signed, Baldwin [place], PA-C  09/14/2020, 8:46 AM    I have seen and examined this patient with [name].  Agree with above, note added to reflect my findings.  On exam, RRR, no murmurs. No further episodes of VT. Paco Cislo continue to load with PO amiodarone. Plan for ICD implant tomorrow.    Bera Pinela M. Anisia Leija MD 09/14/2020 10:02 AM

## 2020-09-15 ENCOUNTER — Encounter (HOSPITAL_COMMUNITY)
Admission: EM | Disposition: A | Discharge status: Home / Self Care | Payer: Self-pay | Source: Home / Self Care | Attending: Cardiology

## 2020-09-15 ENCOUNTER — Ambulatory Visit (HOSPITAL_COMMUNITY): Admit: 2020-09-15 | Payer: Medicare Other | Admitting: Cardiology

## 2020-09-15 DIAGNOSIS — I472 Ventricular tachycardia: Secondary | ICD-10-CM | POA: Diagnosis not present

## 2020-09-15 DIAGNOSIS — I428 Other cardiomyopathies: Secondary | ICD-10-CM | POA: Diagnosis not present

## 2020-09-15 HISTORY — PX: ICD IMPLANT: EP1208

## 2020-09-15 LAB — GLUCOSE, CAPILLARY
Glucose-Capillary: 235 mg/dL — ABNORMAL HIGH (ref 70–99)
Glucose-Capillary: 196 mg/dL — ABNORMAL HIGH (ref 70–99)
Glucose-Capillary: 155 mg/dL — ABNORMAL HIGH (ref 70–99)
Glucose-Capillary: 112 mg/dL — ABNORMAL HIGH (ref 70–99)
Glucose-Capillary: 239 mg/dL — ABNORMAL HIGH (ref 70–99)

## 2020-09-15 LAB — BASIC METABOLIC PANEL
Anion gap: 11 (ref 5–15)
BUN: 23 mg/dL (ref 8–23)
CO2: 25 mmol/L (ref 22–32)
Calcium: 8.6 mg/dL — ABNORMAL LOW (ref 8.9–10.3)
Chloride: 99 mmol/L (ref 98–111)
Creatinine, Ser: 1.03 mg/dL — ABNORMAL HIGH (ref 0.44–1.00)
GFR, Estimated: 56 mL/min — ABNORMAL LOW (ref >60)
Glucose, Bld: 176 mg/dL — ABNORMAL HIGH (ref 70–99)
Potassium: 3.8 mmol/L (ref 3.5–5.1)
Sodium: 135 mmol/L (ref 135–145)

## 2020-09-15 SURGERY — ICD IMPLANT

## 2020-09-15 MED ORDER — MIDAZOLAM HCL 5 MG/5ML IJ SOLN
INTRAMUSCULAR | Status: DC | PRN
  Administered 2020-09-15: 1 mg via INTRAVENOUS

## 2020-09-15 MED ORDER — SODIUM CHLORIDE 0.9 % IV SOLN
INTRAVENOUS | Status: DC | PRN
  Administered 2020-09-15 – 2020-09-16 (×2): 250 mL via INTRAVENOUS

## 2020-09-15 MED ORDER — MIDAZOLAM HCL 5 MG/5ML IJ SOLN
INTRAMUSCULAR | Status: AC
  Filled 2020-09-15: qty 5

## 2020-09-15 MED ORDER — LIDOCAINE HCL (PF) 1 % IJ SOLN
INTRAMUSCULAR | Status: DC | PRN
  Administered 2020-09-15: 50 mL

## 2020-09-15 MED ORDER — CEFAZOLIN SODIUM-DEXTROSE 2-4 GM/100ML-% IV SOLN
INTRAVENOUS | Status: AC
  Filled 2020-09-15: qty 100

## 2020-09-15 MED ORDER — LIDOCAINE HCL (PF) 1 % IJ SOLN
INTRAMUSCULAR | Status: AC
  Filled 2020-09-15: qty 60

## 2020-09-15 MED ORDER — SODIUM CHLORIDE 0.9 % IV SOLN
INTRAVENOUS | Status: AC
  Filled 2020-09-15: qty 2

## 2020-09-15 MED ORDER — SODIUM CHLORIDE 0.9 % IV SOLN: INTRAVENOUS | Status: DC

## 2020-09-15 MED ORDER — SODIUM CHLORIDE 0.9 % IV SOLN: 250.0000 mL | INTRAVENOUS | Status: DC

## 2020-09-15 MED ORDER — ONDANSETRON HCL 4 MG/2ML IJ SOLN: 4.0000 mg | Freq: Four times a day (QID) | INTRAMUSCULAR | Status: DC | PRN

## 2020-09-15 MED ORDER — FUROSEMIDE 40 MG PO TABS
60.0000 mg | ORAL_TABLET | Freq: Two times a day (BID) | ORAL | Status: DC
  Administered 2020-09-15 – 2020-09-16 (×2): 60 mg via ORAL
  Filled 2020-09-15 (×3): qty 1

## 2020-09-15 MED ORDER — FENTANYL CITRATE (PF) 100 MCG/2ML IJ SOLN
INTRAMUSCULAR | Status: AC
  Filled 2020-09-15: qty 2

## 2020-09-15 MED ORDER — EPLERENONE 25 MG PO TABS
25.0000 mg | ORAL_TABLET | Freq: Every day | ORAL | Status: DC
  Administered 2020-09-15 – 2020-09-16 (×2): 25 mg via ORAL
  Filled 2020-09-15 (×2): qty 1

## 2020-09-15 MED ORDER — FENTANYL CITRATE (PF) 100 MCG/2ML IJ SOLN
INTRAMUSCULAR | Status: DC | PRN
  Administered 2020-09-15: 25 ug via INTRAVENOUS

## 2020-09-15 MED ORDER — IOHEXOL 350 MG/ML SOLN
INTRAVENOUS | Status: DC | PRN
  Administered 2020-09-15: 10 mL

## 2020-09-15 MED ORDER — ACETAMINOPHEN 325 MG PO TABS: 325.0000 mg | ORAL_TABLET | ORAL | Status: DC | PRN

## 2020-09-15 MED ORDER — CEFAZOLIN SODIUM-DEXTROSE 1-4 GM/50ML-% IV SOLN
1.0000 g | Freq: Four times a day (QID) | INTRAVENOUS | Status: DC
  Administered 2020-09-15 – 2020-09-16 (×2): 1 g via INTRAVENOUS
  Filled 2020-09-15 (×3): qty 50

## 2020-09-15 MED ORDER — LIDOCAINE HCL (PF) 1 % IJ SOLN
INTRAMUSCULAR | Status: AC
  Filled 2020-09-15: qty 30

## 2020-09-15 MED ORDER — HEPARIN (PORCINE) IN NACL 1000-0.9 UT/500ML-% IV SOLN
INTRAVENOUS | Status: DC | PRN
  Administered 2020-09-15: 500 mL

## 2020-09-15 SURGICAL SUPPLY — 9 items
CABLE SURGICAL S-101-97-12 (CABLE) ×2 IMPLANT
ICD VISIA MRI VR DVFB1D4 (ICD Generator) IMPLANT
KIT MICROPUNCTURE NIT STIFF (SHEATH) ×1 IMPLANT
LEAD SPRINT QUAT SEC 6935M-62 (Lead) ×1 IMPLANT
MAT PREVALON FULL STRYKER (MISCELLANEOUS) ×1 IMPLANT
PAD PRO RADIOLUCENT 2001M-C (PAD) ×2 IMPLANT
SHEATH 9FR PRELUDE SNAP 13 (SHEATH) ×1 IMPLANT
TRAY PACEMAKER INSERTION (PACKS) ×2 IMPLANT
VISIA MRI VR DVFB1D4 (ICD Generator) ×2 IMPLANT

## 2020-09-15 NOTE — Progress Notes (Signed)
Patient returned from Cath lab at 1808hrs.  Left arm in sling and dressing to left chest clean, dry and intact.  Reviewed post procedure instructions with patient, she verbalized understanding.  Spoke with PA Sarajane Jews regarding po lasix and IVF.  Will stop IVF and wait to give po lasix until 2000hrs for BP to stabilize.

## 2020-09-15 NOTE — Progress Notes (Signed)
Inpatient Diabetes Program Recommendations  AACE/ADA: New Consensus Statement on Inpatient Glycemic Control (2015)  Target Ranges:  Prepandial:   less than 140 mg/dL      Peak postprandial:   less than 180 mg/dL (1-2 hours)      Critically ill patients:  140 - 180 mg/dL   Lab Results  Component Value Date   GLUCAP 196 (H) 09/15/2020   HGBA1C 8.1 (H) 09/12/2020    Review of Glycemic Control Results for AI, S[place] (MRN [id number]) as of 09/15/2020 09:24  Ref. Range 09/14/2020 11:33 09/14/2020 16:50 09/14/2020 21:57 09/15/2020 07:54  Glucose-Capillary Latest Ref Range: 70 - 99 mg/dL 147 (H) 235 (H) 311 (H) 196 (H)   Diabetes history: Type 2 DM Outpatient Diabetes medications: Xigduo 11-998 mg BID, Lantus 15 units QHS Current orders for Inpatient glycemic control: Lantus 15 units QHS, Novolog 0-15 units TID, Novolog 0-5 units QHS  Inpatient Diabetes Program Recommendations:    Once diet resumes, consider also adding Novolog 3 units TID (Assuming patient is consuming >50% of meals).   Thanks, Bronson Curb, MSN, RNC-OB Diabetes Coordinator [phone] (8a-5p)

## 2020-09-15 NOTE — Interval H&P Note (Signed)
History and Physical Interval Note:  09/15/2020 11:12 AM  Heidi Stark  has presented today for surgery, with the diagnosis of vt.  The various methods of treatment have been discussed with the patient and family. After consideration of risks, benefits and other options for treatment, the patient has consented to  Procedure(s): ICD IMPLANT (N/A) as a surgical intervention.  The patient's history has been reviewed, patient examined, no change in status, stable for surgery.  I have reviewed the patient's chart and labs.  Questions were answered to the patient's satisfaction.     Heidi Stark  ICD Criteria  Current LVEF:40%. Within 12 months prior to implant: Yes   Heart failure history: Yes, Class II  Cardiomyopathy history: Yes, Non-Ischemic Cardiomyopathy.  Atrial Fibrillation/Atrial Flutter: Yes, Persistent (> 7 Days).  Ventricular tachycardia history: Yes, Hemodynamic instability present. VT Type: Sustained Ventricular Tachycardia - Monomorphic.  Cardiac arrest history: No.  History of syndromes with risk of sudden death: No.  Previous ICD: No.  Current ICD indication: Secondary  PPM indication: No.  Class I or II Bradycardia indication present: No  Beta Blocker therapy for 3 or more months: Yes, prescribed.   Ace Inhibitor/ARB therapy for 3 or more months: Yes, prescribed.    I have seen Heidi Stark is a 80 y.o. femalepre-procedural and has been referred by [name] for consideration of ICD implant for secondary prevention of sudden death.  The patient's chart has been reviewed and they meet criteria for ICD implant.  I have had a thorough discussion with the patient reviewing options.  The patient and their family (if available) have had opportunities to ask questions and have them answered. The patient and I have decided together through the [employer] to implant ICD at this time.  Risks, benefits, alternatives to ICD implantation  were discussed in detail with the patient today. The patient  understands that the risks include but are not limited to bleeding, infection, pneumothorax, perforation, tamponade, vascular damage, renal failure, MI, stroke, death, inappropriate shocks, and lead dislodgement and  wishes to proceed.

## 2020-09-15 NOTE — Progress Notes (Addendum)
Advanced Heart Failure Rounding Note  PCP-Cardiologist: No primary care provider on file.   Subjective:    Yesterday started on farxiga.   Plan for ICD today.  Denies dizziness. Anxious to get procedure over with.      Objective:   Weight Range: 116.2 kg Body mass index is 41.35 kg/m.   Vital Signs:   Temp:  [97.7 F (36.5 C)-98.2 F (36.8 C)] 98 F (36.7 C) (03/02 0800) Pulse Rate:  [64-93] 64 (03/02 0800) Resp:  [15-18] 15 (03/02 0800) BP: (107-138)/(62-72) 111/71 (03/02 0800) SpO2:  [93 %-100 %] 96 % (03/02 0800) Weight:  [116.2 kg] 116.2 kg (03/02 0449) Last BM Date: 09/08/20  Weight change: Filed Weights   09/12/20 1125 09/13/20 0341 09/15/20 0449  Weight: 118.3 kg 113.9 kg 116.2 kg    Intake/Output:  No intake or output data in the 24 hours ending 09/15/20 4034    Physical Exam    General:  Sitting in the chair.  No resp difficulty HEENT: normal Neck: supple. no JVD. Carotids 2+ bilat; no bruits. No lymphadenopathy or thryomegaly appreciated. Cor: PMI nondisplaced. Regular rate & rhythm. No rubs, gallops or murmurs. Lungs: clear Abdomen: obese, soft, nontender, nondistended. No hepatosplenomegaly. No bruits or masses. Good bowel sounds. Extremities: no cyanosis, clubbing, rash, edema Neuro: alert & orientedx3, cranial nerves grossly intact. moves all 4 extremities w/o difficulty. Affect pleasant   Telemetry    A flutter 60-90s   EKG    No new EKG to review   Labs    CBC Recent Labs    09/12/20 1145 09/13/20 0201  WBC 8.5 7.7  NEUTROABS 5.5  --   HGB 14.9 14.3  HCT 45.2 43.9  MCV 93.4 93.6  PLT 298 742   Basic Metabolic Panel Recent Labs    09/12/20 1145 09/13/20 0201 09/14/20 0836 09/15/20 0256  NA 140   < > 134* 135  K 4.2   < > 4.0 3.8  CL 99   < > 94* 99  CO2 27   < > 25 25  GLUCOSE 170*   < > 311* 176*  BUN 18   < > 16 23  CREATININE 1.06*   < > 1.01* 1.03*  CALCIUM 8.5*   < > 9.0 8.6*  MG 2.3  --  2.2  --     < > = values in this interval not displayed.   Liver Function Tests No results for input(s): AST, ALT, ALKPHOS, BILITOT, PROT, ALBUMIN in the last 72 hours. No results for input(s): LIPASE, AMYLASE in the last 72 hours. Cardiac Enzymes No results for input(s): CKTOTAL, CKMB, CKMBINDEX, TROPONINI in the last 72 hours.  BNP: BNP (last 3 results) Recent Labs    09/07/20 1004  BNP 85.9    ProBNP (last 3 results) No results for input(s): PROBNP in the last 8760 hours.   D-Dimer No results for input(s): DDIMER in the last 72 hours. Hemoglobin A1C Recent Labs    09/12/20 1622  HGBA1C 8.1*   Fasting Lipid Panel No results for input(s): CHOL, HDL, LDLCALC, TRIG, CHOLHDL, LDLDIRECT in the last 72 hours. Thyroid Function Tests No results for input(s): TSH, T4TOTAL, T3FREE, THYROIDAB in the last 72 hours.  Invalid input(s): FREET3  Other results:   Imaging    No results found.   Medications:     Scheduled Medications: . amiodarone  400 mg Oral TID  . chlorhexidine  60 mL Topical Once  . chlorhexidine  60 mL  Topical Once  . dapagliflozin propanediol  10 mg Oral Daily  . fenofibrate  160 mg Oral Daily  . furosemide  60 mg Oral BID  . gentamicin irrigation  80 mg Irrigation On Call  . icosapent Ethyl  2 g Oral BID  . insulin aspart  0-15 Units Subcutaneous TID WC  . insulin aspart  0-5 Units Subcutaneous QHS  . insulin glargine  15 Units Subcutaneous QHS  . metoprolol succinate  25 mg Oral QHS  . multivitamin with minerals  1 tablet Oral Daily  . potassium chloride  20 mEq Oral BID  . pravastatin  80 mg Oral q1800  . sacubitril-valsartan  1 tablet Oral BID  . sodium chloride flush  3 mL Intravenous Q12H    Infusions: . sodium chloride 20 mL/hr at 09/13/20 1412  . sodium chloride    . sodium chloride    .  ceFAZolin (ANCEF) IV      PRN Medications: acetaminophen, nitroGLYCERIN, ondansetron (ZOFRAN) IV, sodium chloride flush    Assessment/Plan    1.  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 This was not the typical Tikosyn-induced polymorphic VT. No changes in Tikosyn dosing prior to that admission and no new meds. cMRI showedsubendocardial LGE in the basal to mid inferolateral wall. This appeared to be a coronary disease patternhowevercoronary angiogram did not show significant coronary disease.  She was transitioned from Tikosyn to amiodarone. She is readmitted with recurrent presyncope.  Episodes do not seem orthostatic, she was sitting at a desk and had symptoms 3 times for about 30 seconds each. No VT overnight.   - Continue amiodarone 400 mg tid  - EP planning for ICD today.   2. Atrial fibrillation/flutter: Paroxysmal. 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 She was admitted in 1/18 with atrial fibrillation/RVR and CHF. She was diuresed and had DCCV back to NSR. She had atrial fibrillation ablation in 4/18. In 5/19, she went into atrial fibrillation transiently. In 10/19, she was back in atrial fibrillation and was admitted for Tikosyn initiation and cardioversion.Atypical atrial flutter noted in 12/21 with DCCV to NSR. As outlined above, now off Tikosyn and on amiodarone. Now, back in atypical atrial flutter, rate controlled.   -Continue amiodarone 400 mg tid.  - Eliquis held for planned ICD - Delay TEE DCCV until post ICD placement  3. Claudication: Left leg claudication with absent left PT pulse. 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  4. 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  - Volume status stable.  Continue Lasix 60 mg po bid (home dose).  - Continue Toprol XL 25 mg daily.   -Continue Entresto 24/26 bid today. - ?rash from spironolactone, had been on eplerenone 25 mg daily (not available on formulary, restart after discharge as long as BP stable).  -Continue farxiga. On this prior to admit.  - Renal function stable.  5. CAD: Coronary CTAin 2018showed coronary calcium score in the 82nd percentile with nonobstructive coronary disease. Cathin 2/22showed only luminal irregularities.  - not on ASA 81 - Eliquis currently on hold for procedure.  as she is taking Eliqius.  -  ContinueVascepa and Livalo.  6. COPD:History of smoking.  7. OSA: CPAP.  Will go ahead and set up f/u for next week.   Length of Stay: [place], NP  09/15/2020, 9:22 AM  Advanced Heart Failure Team Pager [phone] (M-F; 7a - 4p)  Please contact [facility] for night-coverage after hours (4p -7a ) and weekends on [email or web address]  Patient seen with NP, agree with the above note.   She is stable, no further VT. No dyspnea.  Feels good.   Plan for ICD today.  Amiodarone now po.  Can start back on home eplerenone.  Eliquis held for device, start back afterwards when ok with EP.  Possibly home tomorrow.   Heidi Stark 09/15/2020 11:30 AM

## 2020-09-15 NOTE — Progress Notes (Addendum)
Progress Note  Patient Name: Heidi Stark Date of Encounter: 09/15/2020  [facility] Cardiologist: Dr. [name]  Subjective   Feels OK, no CP, no SOB, no dizzy spells.   Inpatient Medications    Scheduled Meds: . amiodarone  400 mg Oral TID  . chlorhexidine  60 mL Topical Once  . chlorhexidine  60 mL Topical Once  . dapagliflozin propanediol  10 mg Oral Daily  . fenofibrate  160 mg Oral Daily  . furosemide  60 mg Oral BID  . gentamicin irrigation  80 mg Irrigation On Call  . icosapent Ethyl  2 g Oral BID  . insulin aspart  0-15 Units Subcutaneous TID WC  . insulin aspart  0-5 Units Subcutaneous QHS  . insulin glargine  15 Units Subcutaneous QHS  . metoprolol succinate  25 mg Oral QHS  . multivitamin with minerals  1 tablet Oral Daily  . potassium chloride  20 mEq Oral BID  . pravastatin  80 mg Oral q1800  . sacubitril-valsartan  1 tablet Oral BID  . sodium chloride flush  3 mL Intravenous Q12H   Continuous Infusions: . sodium chloride 20 mL/hr at 09/13/20 1412  . sodium chloride    . sodium chloride    .  ceFAZolin (ANCEF) IV     PRN Meds: acetaminophen, nitroGLYCERIN, ondansetron (ZOFRAN) IV, sodium chloride flush   Vital Signs    Vitals:   09/14/20 1158 09/14/20 2043 09/15/20 0449 09/15/20 0800  BP: 107/65 138/62 112/62 111/71  Pulse: 73 66 93 64  Resp:  16  15  Temp:  97.7 F (36.5 C) 98.2 F (36.8 C) 98 F (36.7 C)  TempSrc:  Oral Oral Oral  SpO2:  100% 98% 96%  Weight:   116.2 kg   Height:       No intake or output data in the 24 hours ending 09/15/20 0904 Last 3 Weights 09/15/2020 09/13/2020 09/12/2020  Weight (lbs) 256 lb 2.8 oz 251 lb 1.6 oz 260 lb 12.9 oz  Weight (kg) 116.2 kg 113.898 kg 118.3 kg      Telemetry    AFlutter, rate controlled - Personally Reviewed  ECG    No new EKGs - Personally Reviewed  Physical Exam   GEN: No acute distress.   Neck: No JVD Cardiac: irreg-irreg, no murmurs, rubs, or gallops.  Respiratory: CTA  b/l. GI: Soft, nontender, non-distended  MS: No edema; No deformity. Neuro:  Nonfocal  Psych: Normal affect   Labs    High Sensitivity Troponin:   Recent Labs  Lab 09/07/20 1004 09/07/20 1210 09/12/20 1145 09/12/20 1403  TROPONINIHS 13 12 13 15       Chemistry Recent Labs  Lab 09/13/20 0201 09/14/20 0836 09/15/20 0256  NA 139 134* 135  K 3.7 4.0 3.8  CL 102 94* 99  CO2 26 25 25   GLUCOSE 197* 311* 176*  BUN 16 16 23   CREATININE 1.18* 1.01* 1.03*  CALCIUM 8.8* 9.0 8.6*  GFRNONAA 47* 57* 56*  ANIONGAP 11 15 11      Hematology Recent Labs  Lab 09/12/20 1145 09/13/20 0201  WBC 8.5 7.7  RBC 4.84 4.69  HGB 14.9 14.3  HCT 45.2 43.9  MCV 93.4 93.6  MCH 30.8 30.5  MCHC 33.0 32.6  RDW 15.1 15.0  PLT 298 274    BNP No results for input(s): BNP, PROBNP in the last 168 hours.   DDimer No results for input(s): DDIMER in the last 168 hours.   Radiology  No results found.  Cardiac Studies   C.MRI 09/13/20 IMPRESSION: 1. Normal LV size with thin/akinetic basal to mid inferolateral wall, EF 40%. 2. Normal RV size with mildly decreased systolic function, EF 25%. 3. Delayed enhancement imaging showed subendocardial LGE in the basal to mid inferolateral wall. This appears to be a coronary disease pattern, but no CAD was seen on recent cath. Technically difficult study due to difficulty with breath-holding.   09/08/20: LHC Left Main  No significant disease.  Left Anterior Descending  Luminal irregularities.  Left Circumflex  Luminal irregularities.  Right Coronary Artery  Luminal irregularities.     09/07/20: TTE IMPRESSIONS  1. Left ventricular ejection fraction, by estimation, is 50 to 55%. The  left ventricle has low normal function. The left ventricle has no regional  wall motion abnormalities. Left ventricular diastolic parameters are  indeterminate.  2. Right ventricular systolic function is normal. The right ventricular  size is normal.  There is normal pulmonary artery systolic pressure.  3. Left atrial size was mild to moderately dilated.  4. Right atrial size was mildly dilated.  5. A small pericardial effusion is present.  6. The mitral valve is abnormal. Trivial mitral valve regurgitation.  Moderate mitral annular calcification.  7. The aortic valve is grossly normal. Aortic valve regurgitation is not  visualized. No aortic stenosis is present.  8. The inferior vena cava is normal in size with greater than 50%  respiratory variability, suggesting right atrial pressure of 3 mmHg.   Comparison(s): No significant change from prior study.     Patient Profile     80 y.o. 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 VT(noted last visit) Amiodarone gtt >> PO yesterday Infrequent PVCs, rare couplet Likely scar mediated   Planned for ICD tooday with Dr. [name]  2. Persistent Atrial Fibrillation/Flutter CHA2DS2Vasc is 7 On Eliquis, appropriately dosed Likely to resume Eliquis tomorrow, pending how her site looks  3. Chronic Combined CHF LVEF by MRI 40% BP tolerating current regime BB entresto Lasix Vescepa No output charted Does not appear volume OL AHF team on board  4. Non-Obstructive CAD INOCA with CAD scar BB Statin/fenofibrate  5. COPD PFTs outpt with amio addition on no home meds Urged to quit smoking No SOB    For questions or updates, please contact [place] Please consult [email or web address] for contact info under        Signed, Baldwin [place], PA-C  09/15/2020, 9:04 AM     I have seen and examined this  patient with [name].  Agree with above, note added to reflect my findings.  On exam, RRR, no murmurs. No further VT overnight, plan for ICD today.    [place] M. Jarissa Sheriff MD 09/15/2020 11:04 AM

## 2020-09-15 NOTE — H&P (View-Only) (Signed)
Progress Note  Patient Name: Heidi Stark Date of Encounter: 09/15/2020  [facility] Cardiologist: Dr. [name]  Subjective   Feels OK, no CP, no SOB, no dizzy spells.   Inpatient Medications    Scheduled Meds: . amiodarone  400 mg Oral TID  . chlorhexidine  60 mL Topical Once  . chlorhexidine  60 mL Topical Once  . dapagliflozin propanediol  10 mg Oral Daily  . fenofibrate  160 mg Oral Daily  . furosemide  60 mg Oral BID  . gentamicin irrigation  80 mg Irrigation On Call  . icosapent Ethyl  2 g Oral BID  . insulin aspart  0-15 Units Subcutaneous TID WC  . insulin aspart  0-5 Units Subcutaneous QHS  . insulin glargine  15 Units Subcutaneous QHS  . metoprolol succinate  25 mg Oral QHS  . multivitamin with minerals  1 tablet Oral Daily  . potassium chloride  20 mEq Oral BID  . pravastatin  80 mg Oral q1800  . sacubitril-valsartan  1 tablet Oral BID  . sodium chloride flush  3 mL Intravenous Q12H   Continuous Infusions: . sodium chloride 20 mL/hr at 09/13/20 1412  . sodium chloride    . sodium chloride    .  ceFAZolin (ANCEF) IV     PRN Meds: acetaminophen, nitroGLYCERIN, ondansetron (ZOFRAN) IV, sodium chloride flush   Vital Signs    Vitals:   09/14/20 1158 09/14/20 2043 09/15/20 0449 09/15/20 0800  BP: 107/65 138/62 112/62 111/71  Pulse: 73 66 93 64  Resp:  16  15  Temp:  97.7 F (36.5 C) 98.2 F (36.8 C) 98 F (36.7 C)  TempSrc:  Oral Oral Oral  SpO2:  100% 98% 96%  Weight:   116.2 kg   Height:       No intake or output data in the 24 hours ending 09/15/20 0904 Last 3 Weights 09/15/2020 09/13/2020 09/12/2020  Weight (lbs) 256 lb 2.8 oz 251 lb 1.6 oz 260 lb 12.9 oz  Weight (kg) 116.2 kg 113.898 kg 118.3 kg      Telemetry    AFlutter, rate controlled - Personally Reviewed  ECG    No new EKGs - Personally Reviewed  Physical Exam   GEN: No acute distress.   Neck: No JVD Cardiac: irreg-irreg, no murmurs, rubs, or gallops.  Respiratory: CTA  b/l. GI: Soft, nontender, non-distended  MS: No edema; No deformity. Neuro:  Nonfocal  Psych: Normal affect   Labs    High Sensitivity Troponin:   Recent Labs  Lab 09/07/20 1004 09/07/20 1210 09/12/20 1145 09/12/20 1403  TROPONINIHS 13 12 13 15       Chemistry Recent Labs  Lab 09/13/20 0201 09/14/20 0836 09/15/20 0256  NA 139 134* 135  K 3.7 4.0 3.8  CL 102 94* 99  CO2 26 25 25   GLUCOSE 197* 311* 176*  BUN 16 16 23   CREATININE 1.18* 1.01* 1.03*  CALCIUM 8.8* 9.0 8.6*  GFRNONAA 47* 57* 56*  ANIONGAP 11 15 11      Hematology Recent Labs  Lab 09/12/20 1145 09/13/20 0201  WBC 8.5 7.7  RBC 4.84 4.69  HGB 14.9 14.3  HCT 45.2 43.9  MCV 93.4 93.6  MCH 30.8 30.5  MCHC 33.0 32.6  RDW 15.1 15.0  PLT 298 274    BNP No results for input(s): BNP, PROBNP in the last 168 hours.   DDimer No results for input(s): DDIMER in the last 168 hours.   Radiology  No results found.  Cardiac Studies   C.MRI 09/13/20 IMPRESSION: 1. Normal LV size with thin/akinetic basal to mid inferolateral wall, EF 40%. 2. Normal RV size with mildly decreased systolic function, EF 96%. 3. Delayed enhancement imaging showed subendocardial LGE in the basal to mid inferolateral wall. This appears to be a coronary disease pattern, but no CAD was seen on recent cath. Technically difficult study due to difficulty with breath-holding.   09/08/20: LHC Left Main  No significant disease.  Left Anterior Descending  Luminal irregularities.  Left Circumflex  Luminal irregularities.  Right Coronary Artery  Luminal irregularities.     09/07/20: TTE IMPRESSIONS  1. Left ventricular ejection fraction, by estimation, is 50 to 55%. The  left ventricle has low normal function. The left ventricle has no regional  wall motion abnormalities. Left ventricular diastolic parameters are  indeterminate.  2. Right ventricular systolic function is normal. The right ventricular  size is normal.  There is normal pulmonary artery systolic pressure.  3. Left atrial size was mild to moderately dilated.  4. Right atrial size was mildly dilated.  5. A small pericardial effusion is present.  6. The mitral valve is abnormal. Trivial mitral valve regurgitation.  Moderate mitral annular calcification.  7. The aortic valve is grossly normal. Aortic valve regurgitation is not  visualized. No aortic stenosis is present.  8. The inferior vena cava is normal in size with greater than 50%  respiratory variability, suggesting right atrial pressure of 3 mmHg.   Comparison(s): No significant change from prior study.     Patient Profile     80 y.o. 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 VT(noted last visit) Amiodarone gtt >> PO yesterday Infrequent PVCs, rare couplet Likely scar mediated   Planned for ICD tooday with Dr. [name]  2. Persistent Atrial Fibrillation/Flutter CHA2DS2Vasc is 7 On Eliquis, appropriately dosed Likely to resume Eliquis tomorrow, pending how her site looks  3. Chronic Combined CHF LVEF by MRI 40% BP tolerating current regime BB entresto Lasix Vescepa No output charted Does not appear volume OL AHF team on board  4. Non-Obstructive CAD INOCA with CAD scar BB Statin/fenofibrate  5. COPD PFTs outpt with amio addition on no home meds Urged to quit smoking No SOB    For questions or updates, please contact [place] Please consult [email or web address] for contact info under        Signed, Baldwin [place], PA-C  09/15/2020, 9:04 AM     I have seen and examined this  patient with [name].  Agree with above, note added to reflect my findings.  On exam, RRR, no murmurs. No further VT overnight, plan for ICD today.    Will M. Camnitz MD 09/15/2020 11:04 AM

## 2020-09-15 NOTE — Progress Notes (Signed)
Patient taken to Cath lab at 1600hrs.

## 2020-09-16 ENCOUNTER — Inpatient Hospital Stay (HOSPITAL_COMMUNITY): Payer: Medicare Other

## 2020-09-16 ENCOUNTER — Encounter (HOSPITAL_COMMUNITY): Payer: Self-pay | Admitting: Cardiology

## 2020-09-16 DIAGNOSIS — I472 Ventricular tachycardia: Secondary | ICD-10-CM | POA: Diagnosis not present

## 2020-09-16 LAB — BASIC METABOLIC PANEL
Anion gap: 12 (ref 5–15)
BUN: 24 mg/dL — ABNORMAL HIGH (ref 8–23)
CO2: 24 mmol/L (ref 22–32)
Calcium: 8.3 mg/dL — ABNORMAL LOW (ref 8.9–10.3)
Chloride: 100 mmol/L (ref 98–111)
Creatinine, Ser: 1.23 mg/dL — ABNORMAL HIGH (ref 0.44–1.00)
GFR, Estimated: 45 mL/min — ABNORMAL LOW (ref >60)
Glucose, Bld: 166 mg/dL — ABNORMAL HIGH (ref 70–99)
Potassium: 4.1 mmol/L (ref 3.5–5.1)
Sodium: 136 mmol/L (ref 135–145)

## 2020-09-16 LAB — GLUCOSE, CAPILLARY
Glucose-Capillary: 329 mg/dL — ABNORMAL HIGH (ref 70–99)
Glucose-Capillary: 122 mg/dL — ABNORMAL HIGH (ref 70–99)

## 2020-09-16 MED ORDER — POTASSIUM CHLORIDE CRYS ER 10 MEQ PO TBCR: 20.0000 meq | EXTENDED_RELEASE_TABLET | Freq: Two times a day (BID) | ORAL | 11 refills | Status: DC

## 2020-09-16 MED ORDER — FUROSEMIDE 40 MG PO TABS: 60.0000 mg | ORAL_TABLET | Freq: Two times a day (BID) | ORAL | 3 refills | Status: DC

## 2020-09-16 MED ORDER — EPLERENONE 25 MG PO TABS: 25.0000 mg | ORAL_TABLET | Freq: Every day | ORAL | 5 refills | Status: DC

## 2020-09-16 MED ORDER — AMIODARONE HCL 200 MG PO TABS: ORAL_TABLET | ORAL | 1 refills | Status: DC

## 2020-09-16 MED ORDER — APIXABAN 5 MG PO TABS: 5.0000 mg | ORAL_TABLET | Freq: Two times a day (BID) | ORAL | 11 refills | Status: DC

## 2020-09-16 MED ORDER — INSULIN ASPART 100 UNIT/ML ~~LOC~~ SOLN: 3.0000 [IU] | Freq: Three times a day (TID) | SUBCUTANEOUS | Status: DC

## 2020-09-16 MED ORDER — MAGNESIUM HYDROXIDE 400 MG/5ML PO SUSP
30.0000 mL | Freq: Every day | ORAL | Status: DC | PRN
  Administered 2020-09-16: 30 mL via ORAL
  Filled 2020-09-16: qty 30

## 2020-09-16 NOTE — Progress Notes (Addendum)
Advanced Heart Failure Rounding Note  PCP-Cardiologist: No primary care provider on file.   Subjective:    No further VT. ICD placed yesterday. Device interrogation ok. Device pocket stable. CXR this morning shows proper lead placement and no PTX.   Slight bump in SCr from 1.0>>1.2 (baseline ~0.9). BUN 16>>24. Down 4 lb from admit wt. Already received am dose of Lasix.    Objective:   Weight Range: 116.2 kg Body mass index is 41.35 kg/m.   Vital Signs:   Temp:  [97.6 F (36.4 C)-98.4 F (36.9 C)] 97.6 F (36.4 C) (03/03 0800) Pulse Rate:  [60-257] 60 (03/03 0800) Resp:  [12-165] 15 (03/02 2347) BP: (100-156)/(58-85) 116/71 (03/03 0800) SpO2:  [93 %-100 %] 97 % (03/03 0800) Last BM Date: 09/14/20  Weight change: Filed Weights   09/12/20 1125 09/13/20 0341 09/15/20 0449  Weight: 118.3 kg 113.9 kg 116.2 kg    Intake/Output:   Intake/Output Summary (Last 24 hours) at 09/16/2020 0942 Last data filed at 09/15/2020 1900 Gross per 24 hour  Intake 319.14 ml  Output -  Net 319.14 ml      Physical Exam    PHYSICAL EXAM: General:  Well appearing, sitting up in chair. No respiratory difficulty HEENT: normal Neck: supple. no JVD. Carotids 2+ bilat; no bruits. No lymphadenopathy or thyromegaly appreciated. Cor: PMI nondisplaced. Regular rate & rhythm. No rubs, gallops or murmurs. Device pocket left upper chest stable and bandaged. No visible drainage Lungs: clear Abdomen: soft, nontender, nondistended. No hepatosplenomegaly. No bruits or masses. Good bowel sounds. Extremities: no cyanosis, clubbing, rash, edema Neuro: alert & oriented x 3, cranial nerves grossly intact. moves all 4 extremities w/o difficulty. Affect pleasant.  Telemetry    A flutter upper 50s   EKG    No new EKG to review   Labs    CBC No results for input(s): WBC, NEUTROABS, HGB, HCT, MCV, PLT in the last 72 hours. Basic Metabolic Panel Recent Labs    09/14/20 0836 09/15/20 0256  09/16/20 0324  NA 134* 135 136  K 4.0 3.8 4.1  CL 94* 99 100  CO2 25 25 24   GLUCOSE 311* 176* 166*  BUN 16 23 24*  CREATININE 1.01* 1.03* 1.23*  CALCIUM 9.0 8.6* 8.3*  MG 2.2  --   --    Liver Function Tests No results for input(s): AST, ALT, ALKPHOS, BILITOT, PROT, ALBUMIN in the last 72 hours. No results for input(s): LIPASE, AMYLASE in the last 72 hours. Cardiac Enzymes No results for input(s): CKTOTAL, CKMB, CKMBINDEX, TROPONINI in the last 72 hours.  BNP: BNP (last 3 results) Recent Labs    09/07/20 1004  BNP 85.9    ProBNP (last 3 results) No results for input(s): PROBNP in the last 8760 hours.   D-Dimer No results for input(s): DDIMER in the last 72 hours. Hemoglobin A1C No results for input(s): HGBA1C in the last 72 hours. Fasting Lipid Panel No results for input(s): CHOL, HDL, LDLCALC, TRIG, CHOLHDL, LDLDIRECT in the last 72 hours. Thyroid Function Tests No results for input(s): TSH, T4TOTAL, T3FREE, THYROIDAB in the last 72 hours.  Invalid input(s): FREET3  Other results:   Imaging    DG Chest 2 View  Result Date: 09/16/2020 CLINICAL DATA:  Cardiac device in situ. EXAM: CHEST - 2 VIEW COMPARISON:  09/12/2020. FINDINGS: Lower portion of the chest incompletely imaged. Cardiac pacer lead noted over the right ventricle. Cardiomegaly. No pulmonary venous congestion. Low lung volumes with bibasilar atelectasis. Tiny  bilateral pleural effusions can not be excluded. No pneumothorax. IMPRESSION: 1. Lower portion of the chest incompletely imaged. Cardiac pacer lead noted over the right ventricle. Cardiomegaly. No pulmonary venous congestion. 2. Low lung volumes with bibasilar atelectasis. Tiny bilateral pleural effusions can not be excluded. No pneumothorax. Electronically Signed   By: Marcello Moores  Register   On: 09/16/2020 08:51   EP PPM/ICD IMPLANT  Result Date: 09/15/2020 SURGEON:  Allegra Lai, MD    PREPROCEDURE DIAGNOSES:  1. nonischemic cardiomyopathy.  2. [place] Heart Association class II, heart failure chronically.  3. Ventricular tachycardia  POSTPROCEDURE DIAGNOSES:  1. Nonischemic cardiomyopathy.  2. [place] Heart Association class II heart failure chronically.  3. Ventricular tachycardia    PROCEDURES:   1. ICD implantation.  2. Upper extremity venogram   INTRODUCTION:  Heidi Stark is a 80 y.o. female with an ischemic CM (EF 40%), NYHA Class II CHF, and CAD. 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    DESCRIPTION OF PROCEDURE:  Informed written consent was obtained and the patient was brought to the electrophysiology lab in the fasting state. The patient was adequately sedated with intravenous Versed, and fentanyl as outlined in the nursing report.  The patient's left chest was prepped and draped in the usual sterile fashion by the EP lab staff.  The skin overlying the left deltopectoral region was infiltrated with lidocaine for local analgesia.  A 5-cm incision was made over the left deltopectoral region.  A left subcutaneous defibrillator pocket was fashioned using a combination of sharp and blunt dissection.  Electrocautery was used to assure hemostasis. Left Upper extremity Venography:  A venogram of the left upper extremity was performed which revealed a moderate sized left axillary vein which emptied into a moderate sized left subclavian vein.  RV Lead Placement: The left axillary vein was cannulated with fluoroscopic visualization.  Through the left axillary vein,  a Medtronic, model [id number] (serial number [id number]) right ventricular defibrillator lead were advanced with fluoroscopic visualization into the right ventricular apex.  Initial right ventricular lead R-wave measured 7.4 mV with impedance of 694 ohms and a threshold of 0.8 volts at 0.5 milliseconds. The leads were secured to the pectoralis  fascia using #2 silk suture over the suture sleeves.  The pocket then  irrigated with  copious gentamicin solution.  The leads were then  connected to a Medtronic Visia AF MRI VR SureScan serial  Number [id number]) ICD.  The defibrillator was placed into the  pocket.  The pocket was then closed in 3 layers with 2.0 Vicryl suture for the subcutaneous and 3.0 Vicryl suture subcuticular layers. EBL<23ml.  Steri-Strips and a  sterile dressing were then applied.  CONCLUSIONS:  1. Ischemic cardiomyopathy with chronic [place] Heart Association class II heart failure.  2. Ventricular tachycardia  3. Successful ICD implantation.  4. No early apparent complications. [name], MD 09/15/2020 5:48 PM     Medications:     Scheduled Medications: . amiodarone  400 mg Oral TID  . dapagliflozin propanediol  10 mg Oral Daily  . eplerenone  25 mg Oral Daily  . fenofibrate  160 mg Oral Daily  . furosemide  60 mg Oral BID  . icosapent Ethyl  2 g Oral BID  . insulin aspart  0-15 Units Subcutaneous TID WC  . insulin aspart  0-5 Units Subcutaneous QHS  . insulin aspart  3 Units Subcutaneous TID WC  .  insulin glargine  15 Units Subcutaneous QHS  . metoprolol succinate  25 mg Oral QHS  . multivitamin with minerals  1 tablet Oral Daily  . potassium chloride  20 mEq Oral BID  . pravastatin  80 mg Oral q1800  . sacubitril-valsartan  1 tablet Oral BID  . sodium chloride flush  3 mL Intravenous Q12H    Infusions: . sodium chloride 250 mL (09/16/20 0452)  .  ceFAZolin (ANCEF) IV 1 g (09/16/20 0453)    PRN Medications: sodium chloride, acetaminophen, magnesium hydroxide, nitroGLYCERIN, ondansetron (ZOFRAN) IV    Assessment/Plan    1. 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 This was not the typical Tikosyn-induced polymorphic VT. No changes in Tikosyn dosing prior to that admission and no new meds. cMRI showedsubendocardial LGE in the basal to mid inferolateral wall. This appeared to be a coronary disease patternhowevercoronary angiogram did  not show significant coronary disease.  She was transitioned from Tikosyn to amiodarone. She is readmitted with recurrent presyncope.  Episodes do not seem orthostatic, she was sitting at a desk and had symptoms 3 times for about 30 seconds each. No VT overnight.   - Continue amiodarone 400 mg bid  - S/p Medtronic single chamber ICD 3/2 2. Atrial fibrillation/flutter: Paroxysmal. 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 She was admitted in 1/18 with atrial fibrillation/RVR and CHF. She was diuresed and had DCCV back to NSR. She had atrial fibrillation ablation in 4/18. In 5/19, she went into atrial fibrillation transiently. In 10/19, she was back in atrial fibrillation and was admitted for Tikosyn initiation and cardioversion.Atypical atrial flutter noted in 12/21 with DCCV to NSR. As outlined above, now off Tikosyn and on amiodarone. Now, back in atypical atrial flutter, rate controlled.   -D/w EP amio taper, 400 mg bid x 2 weeks>>400 mg daily x 2 weeks>>200 mg daily  - Per EP, ok to resume Eliquis tonight  - Plan outpatient DCCV in 3 weeks if still in AFL  3. Claudication: Left leg claudication with absent left PT pulse. 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  4. 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  - Volume status stable.  - Hold tonight's dose of Lasix w/ mild AKI. Can resume 60 mg bid tomorrow.   - Continue Toprol XL 25 mg daily.   -Continue Entresto 24/26 bid today. - Continue eplerenone 25 mg daily  - Continue farxiga  5. CAD: Coronary CTAin 2018showed coronary calcium score in the 82nd percentile  with nonobstructive coronary disease. Cathin 2/22showed only luminal irregularities.  - not on ASA due to Eliquis  - ContinueVascepa and Livalo.  6. COPD:History of smoking.  7. OSA: CPAP.  [place] for d/c home today. Has f/u on 3/9. Will need repeat BMP at post hospital f/u.   Length of Stay: 2  Lyda Jester, PA-C  09/16/2020, 9:42 AM  Advanced Heart Failure Team Pager [phone] (M-F; 7a - 4p)  Please contact [facility] for night-coverage after hours (4p -7a ) and weekends on [email or web address]  Patient seen with PA, agree with the above note.    MDT ICD placed yesterday.  She remains in rate-controlled atrial flutter.  No complaints.  Creatinine mildly higher. No further VT.   General: NAD Neck: No JVD, no thyromegaly or thyroid nodule.  Lungs: Clear to auscultation bilaterally with normal respiratory effort. CV: Nondisplaced PMI.  Heart irregular S1/S2, no S3/S4, no murmur.  No peripheral edema.   Abdomen: Soft, nontender, no hepatosplenomegaly, no distention.  Skin: Intact without lesions or rashes.  Neurologic: Alert and oriented x 3.  Psych: Normal affect. Extremities: No clubbing or cyanosis.  HEENT: Normal.   OK for home today.  She can resume Eliquis tonight.  Amiodarone taper as above. Agree with holding tonight's Lasix then restart 60 mg bid tomorrow.    Followup in CHF clinic, will arrange DCCV of atrial flutter in 1 month.   [name] 09/16/2020 11:10 AM

## 2020-09-16 NOTE — Progress Notes (Signed)
Inpatient Diabetes Program Recommendations  AACE/ADA: New Consensus Statement on Inpatient Glycemic Control (2015)  Target Ranges:  Prepandial:   less than 140 mg/dL      Peak postprandial:   less than 180 mg/dL (1-2 hours)      Critically ill patients:  140 - 180 mg/dL   Lab Results  Component Value Date   GLUCAP 329 (H) 09/16/2020   HGBA1C 8.1 (H) 09/12/2020    Review of Glycemic Control Results for Heidi Stark, Heidi Stark (MRN [id number]) as of 09/16/2020 09:23  Ref. Range 09/15/2020 15:49 09/15/2020 21:13 09/16/2020 08:00  Glucose-Capillary Latest Ref Range: 70 - 99 mg/dL 112 (H) 239 (H) 329 (H)   Diabetes history:Type 2 DM Outpatient Diabetes medications:Xigduo 11-998 mg BID, Lantus 15 units QHS Current orders for Inpatient glycemic control:Lantus 15 units QHS, Novolog 0-15 units TID, Novolog 0-5 units QHS  Inpatient Diabetes Program Recommendations:  Consider adding Novolog 3 units TID (Assuming patient is consuming >50% of meals).   Secure chat sent to RN to determine if AM FSBG was accurate.   Thanks, Bronson Curb, MSN, RNC-OB Diabetes Coordinator [phone] (8a-5p)

## 2020-09-16 NOTE — Progress Notes (Addendum)
Progress Note  Patient Name: Heidi Stark Date of Encounter: 09/16/2020  [facility] Cardiologist: Dr. [name]  Subjective   Feels OK, no CP, no SOB, no dizzy spells. No site pain  Inpatient Medications    Scheduled Meds:  amiodarone  400 mg Oral TID   dapagliflozin propanediol  10 mg Oral Daily   eplerenone  25 mg Oral Daily   fenofibrate  160 mg Oral Daily   furosemide  60 mg Oral BID   icosapent Ethyl  2 g Oral BID   insulin aspart  0-15 Units Subcutaneous TID WC   insulin aspart  0-5 Units Subcutaneous QHS   insulin aspart  3 Units Subcutaneous TID WC   insulin glargine  15 Units Subcutaneous QHS   metoprolol succinate  25 mg Oral QHS   multivitamin with minerals  1 tablet Oral Daily   potassium chloride  20 mEq Oral BID   pravastatin  80 mg Oral q1800   sacubitril-valsartan  1 tablet Oral BID   sodium chloride flush  3 mL Intravenous Q12H   Continuous Infusions:  sodium chloride 250 mL (09/16/20 0452)    ceFAZolin (ANCEF) IV 1 g (09/16/20 0453)   PRN Meds: sodium chloride, acetaminophen, magnesium hydroxide, nitroGLYCERIN, ondansetron (ZOFRAN) IV   Vital Signs    Vitals:   09/15/20 1853 09/15/20 1908 09/15/20 2347 09/16/20 0800  BP: (!) 142/63 112/68 108/68 116/71  Pulse: 61 69 62 60  Resp: 18 18 15    Temp:  97.6 F (36.4 C) 97.9 F (36.6 C) 97.6 F (36.4 C)  TempSrc:  Oral Oral Oral  SpO2: 96% 93% 96% 97%  Weight:      Height:        Intake/Output Summary (Last 24 hours) at 09/16/2020 0939 Last data filed at 09/15/2020 1900 Gross per 24 hour  Intake 319.14 ml  Output --  Net 319.14 ml   Last 3 Weights 09/15/2020 09/13/2020 09/12/2020  Weight (lbs) 256 lb 2.8 oz 251 lb 1.6 oz 260 lb 12.9 oz  Weight (kg) 116.2 kg 113.898 kg 118.3 kg      Telemetry    AFlutter, rate controlled - Personally Reviewed  ECG    AF - Personally Reviewed  Physical Exam   GEN: No acute distress.   Neck: No JVD Cardiac: irreg-irreg, no murmurs, rubs, or gallops.   Respiratory: CTA b/l. GI: Soft, nontender, non-distended  MS: No edema; No deformity. Neuro:  Nonfocal  Psych: Normal affect   Site is stable, no bleeding hematoma  Labs    High Sensitivity Troponin:   Recent Labs  Lab 09/07/20 1004 09/07/20 1210 09/12/20 1145 09/12/20 1403  TROPONINIHS 13 12 13 15       Chemistry Recent Labs  Lab 09/14/20 0836 09/15/20 0256 09/16/20 0324  NA 134* 135 136  K 4.0 3.8 4.1  CL 94* 99 100  CO2 25 25 24   GLUCOSE 311* 176* 166*  BUN 16 23 24*  CREATININE 1.01* 1.03* 1.23*  CALCIUM 9.0 8.6* 8.3*  GFRNONAA 57* 56* 45*  ANIONGAP 15 11 12      Hematology Recent Labs  Lab 09/12/20 1145 09/13/20 0201  WBC 8.5 7.7  RBC 4.84 4.69  HGB 14.9 14.3  HCT 45.2 43.9  MCV 93.4 93.6  MCH 30.8 30.5  MCHC 33.0 32.6  RDW 15.1 15.0  PLT 298 274    BNP No results for input(s): BNP, PROBNP in the last 168 hours.   DDimer No results for input(s): DDIMER in  the last 168 hours.   Radiology    DG Chest 2 View  Result Date: 09/16/2020 CLINICAL DATA:  Cardiac device in situ. EXAM: CHEST - 2 VIEW COMPARISON:  09/12/2020. FINDINGS: Lower portion of the chest incompletely imaged. Cardiac pacer lead noted over the right ventricle. Cardiomegaly. No pulmonary venous congestion. Low lung volumes with bibasilar atelectasis. Tiny bilateral pleural effusions can not be excluded. No pneumothorax. IMPRESSION: 1. Lower portion of the chest incompletely imaged. Cardiac pacer lead noted over the right ventricle. Cardiomegaly. No pulmonary venous congestion. 2. Low lung volumes with bibasilar atelectasis. Tiny bilateral pleural effusions can not be excluded. No pneumothorax. Electronically Signed   By: Marcello Moores  Register   On: 09/16/2020 08:51      Cardiac Studies   C.MRI 09/13/20 IMPRESSION: 1. Normal LV size with thin/akinetic basal to mid inferolateral wall, EF 40%. 2.  Normal RV size with mildly decreased systolic function, EF 62%. 3. Delayed enhancement  imaging showed subendocardial LGE in the basal to mid inferolateral wall. This appears to be a coronary disease pattern, but no CAD was seen on recent cath. Technically difficult study due to difficulty with breath-holding.   09/08/20: LHC Left Main  No significant disease.  Left Anterior Descending  Luminal irregularities.  Left Circumflex  Luminal irregularities.  Right Coronary Artery  Luminal irregularities.        09/07/20: TTE IMPRESSIONS   1. Left ventricular ejection fraction, by estimation, is 50 to 55%. The  left ventricle has low normal function. The left ventricle has no regional  wall motion abnormalities. Left ventricular diastolic parameters are  indeterminate.   2. Right ventricular systolic function is normal. The right ventricular  size is normal. There is normal pulmonary artery systolic pressure.   3. Left atrial size was mild to moderately dilated.   4. Right atrial size was mildly dilated.   5. A small pericardial effusion is present.   6. The mitral valve is abnormal. Trivial mitral valve regurgitation.  Moderate mitral annular calcification.   7. The aortic valve is grossly normal. Aortic valve regurgitation is not  visualized. No aortic stenosis is present.   8. The inferior vena cava is normal in size with greater than 50%  respiratory variability, suggesting right atrial pressure of 3 mmHg.   Comparison(s): No significant change from prior study.      Patient Profile     80 y.o. 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 VT (noted last visit) Amiodarone gtt >> PO  Infrequent PVCs,  rare couplet Likely scar mediated   S/p ICD yesterday with Dr. [name] Device check this Am with intact function CXR without ptx, reviewed with Dr. [name] lead position, given stable device check, no need to repeat Wound is stable Wound care and activity restrictions were reviewed with the patient Follow up is in place  Discussed no driving 60mo given recurrent near syncope, patient is aware  Amiodarone 400mg  BID x 2 weeks, 400mg  daily for 2 weeks, 200mg  daily   2. Persistent Atrial Fibrillation/Flutter  CHA2DS2Vasc is 7 On Eliquis, appropriately dosed OK for Eliquis tonight  3.  Chronic Combined CHF  LVEF by MRI 40% BP tolerating current regime BB entresto Lasix Vescepa No output charted Does not appear volume OL C/w AHF team   4. Non-Obstructive CAD  INOCA with CAD scar BB Statin/fenofibrate  5. COPD  PFTs outpt with amio addition on no home meds Urged to quit smoking No SOB   Dr. [name] has seen and examined the patient OK for discharge from EP perspective    For questions or updates, please contact [facility] Please consult [email or web address] for contact info under        Signed, Baldwin [place], PA-C  09/16/2020, 9:39 AM     I have seen and examined this patient with [name].  Agree with above, note added to reflect my findings.  On exam, irregular, no murmurs.  She is now status post Medtronic ICD for secondary prevention.  Device functioning appropriately.  Chest x-ray and interrogation without issue.  Okay for discharge today with follow-up in device clinic.  Would discharge on amiodarone load as above.  Will M. Camnitz MD 09/16/2020 10:25 AM

## 2020-09-16 NOTE — Plan of Care (Signed)

## 2020-09-16 NOTE — Discharge Instructions (Signed)
    Supplemental Discharge Instructions for  Pacemaker/Defibrillator Patients  Activity No heavy lifting or vigorous activity with your left/right arm for 6 to 8 weeks.  Do not raise your left/right arm above your head for one week.  Gradually raise your affected arm as drawn below.              09/23/20                    09/24/20                     09/25/20                  09/26/20 __  NO DRIVING for 6 months.  WOUND CARE - Keep the wound area clean and dry.  Do not get this area wet , no showers until cleared to at your wound check visit . - The tape/steri-strips on your wound will fall off; do not pull them off.  No bandage is needed on the site.  DO  NOT apply any creams, oils, or ointments to the wound area. - If you notice any drainage or discharge from the wound, any swelling or bruising at the site, or you develop a fever > 101? F after you are discharged home, call the office at once.  Special Instructions - You are still able to use cellular telephones; use the ear opposite the side where you have your pacemaker/defibrillator.  Avoid carrying your cellular phone near your device. - When traveling through airports, show security personnel your identification card to avoid being screened in the metal detectors.  Ask the security personnel to use the hand wand. - Avoid arc welding equipment, MRI testing (magnetic resonance imaging), TENS units (transcutaneous nerve stimulators).  Call the office for questions about other devices. - Avoid electrical appliances that are in poor condition or are not properly grounded. - Microwave ovens are safe to be near or to operate.  Additional information for defibrillator patients should your device go off: - If your device goes off ONCE and you feel fine afterward, notify the device clinic nurses. - If your device goes off ONCE and you do not feel well afterward, call 911. - If your device goes off TWICE, call 911. - If your device goes off  THREE times in one day, call 911.  DO NOT DRIVE YOURSELF OR A FAMILY MEMBER WITH A DEFIBRILLATOR TO THE HOSPITAL--CALL 911.

## 2020-09-16 NOTE — Discharge Summary (Addendum)
Advanced Heart Failure Team  Discharge Summary   Patient ID: Heidi Stark MRN: [id number], DOB/AGE: Feb 10, 1941 80 y.o. Admit date: 09/12/2020 D/C date:     09/16/2020   Primary Discharge Diagnoses:  Ventricular Tachycardia S/p Medtronic ICD 09/15/20 Persistent Atrial Flutter (rate controlled) Chronic Diastolic Heart Failure CAD COPD OSA   Hospital Course:   80 y/o female w/ h/o atrial fibrillation and atrial flutter, s/p Afib ablation 10/2016 w/ recurrence, ultimately placed on Tikosyn 04/2018. H/opresumed cardioembolic splenic and renal infarcts, on Eliquis. Also w/ chronic systolic=>>diastolic heart failure, presumed to be tachymediated in the setting of atrial fibrillation.EF 30-35% on echo in 1/18, down from 50-55% in 4/15. EF wasback up to 50% on 4/18 echo and 55% on 6/19 echo.She had coronary CTAin 2018 thatshowed coronary calcium score in the 82nd percentile with nonobstructive coronary disease. Has known PAD and COPD in the setting of tobacco abuse.   She had a recent admission in 2/22 with lightheadedness/presyncope that appeared to correlate with runs of VT.  She was on Tikosyn for atrial fibrillation.  Tikosyn was stopped and amiodarone was started.  Cardiac MRI showed EF 40% with subendocardial scar in the basal to mid inferolateral wall, RV EF was 38%.  She had coronary angiography showing no obstructive disease.  VT subsided and she was sent home on amiodarone on 2/26.  Returned back to the ED on 2/27 w/ 3 episodes of presyncope in succession while sitting at a desk.  No prodrome, each episode of severe lightheadedness lasted maybe 30 seconds.  She did not pass out. Because of these events, she came to the ER. She was admitted for observation and amiodarone gtt was restarted.  She had no VT but was in atypical atrial flutter that was rate controlled. Not orthostatic. EP consulted and recommended ICD placement.   On 3/2, underwent single chamber Medtronic ICD w/o  complication. IV amio switched to PO w/ planned taper, 400 mg bid x 2 weeks>>400 mg daily x 2 weeks>>200 mg daily. Remained stable from CHF standpoint.   On 3/3, she was seen and examined by Dr. [name] and felt stable for d/c home. EP also cleared for d/c home and cleared to restart Eliquis night of 3/3. Post hospital f/u in the [facility] arranged 3/9. Wound check f/u has been arranged in EP device clinic. Dr. [name] to follow in EP clinic.   Plan outpatient DCCV in 3 weeks if still in AFL.   Discharge Weight Range: 256 lb  Discharge Vitals: Blood pressure 116/71, pulse 60, temperature 97.6 F (36.4 C), temperature source Oral, resp. rate 15, height 5\' 6"  (1.676 m), weight 116.2 kg, SpO2 97 %.  Labs: Lab Results  Component Value Date   WBC 7.7 09/13/2020   HGB 14.3 09/13/2020   HCT 43.9 09/13/2020   MCV 93.6 09/13/2020   PLT 274 09/13/2020    Recent Labs  Lab 09/16/20 0324  NA 136  K 4.1  CL 100  CO2 24  BUN 24*  CREATININE 1.23*  CALCIUM 8.3*  GLUCOSE 166*   Lab Results  Component Value Date   CHOL 220 (H) 12/17/2019   HDL 28 (L) 12/17/2019   LDLCALC 123 (H) 12/17/2019   TRIG 345 (H) 12/17/2019   BNP (last 3 results) Recent Labs    09/07/20 1004  BNP 85.9    ProBNP (last 3 results) No results for input(s): PROBNP in the last 8760 hours.   Diagnostic Studies/Procedures   DG Chest 2 View  Result Date: 09/16/2020  CLINICAL DATA:  Cardiac device in situ. EXAM: CHEST - 2 VIEW COMPARISON:  09/12/2020. FINDINGS: Lower portion of the chest incompletely imaged. Cardiac pacer lead noted over the right ventricle. Cardiomegaly. No pulmonary venous congestion. Low lung volumes with bibasilar atelectasis. Tiny bilateral pleural effusions can not be excluded. No pneumothorax. IMPRESSION: 1. Lower portion of the chest incompletely imaged. Cardiac pacer lead noted over the right ventricle. Cardiomegaly. No pulmonary venous congestion. 2. Low lung volumes with bibasilar atelectasis.  Tiny bilateral pleural effusions can not be excluded. No pneumothorax. Electronically Signed   By: Marcello Moores  Register   On: 09/16/2020 08:51   EP PPM/ICD IMPLANT  Result Date: 09/15/2020 SURGEON:  Allegra Lai, MD    PREPROCEDURE DIAGNOSES:  1. nonischemic cardiomyopathy.  2. New York Heart Association class II, heart failure chronically.  3. Ventricular tachycardia  POSTPROCEDURE DIAGNOSES:  1. Nonischemic cardiomyopathy.  2. New York Heart Association class II heart failure chronically.  3. Ventricular tachycardia    PROCEDURES:   1. ICD implantation.  2. Upper extremity venogram   INTRODUCTION:  Heidi Stark is a 80 y.o. female with an ischemic CM (EF 40%), NYHA Class II CHF, and CAD. 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    DESCRIPTION OF PROCEDURE:  Informed written consent was obtained and the patient was brought to the electrophysiology lab in the fasting state. The patient was adequately sedated with intravenous Versed, and fentanyl as outlined in the nursing report.  The patient's left chest was prepped and draped in the usual sterile fashion by the EP lab staff.  The skin overlying the left deltopectoral region was infiltrated with lidocaine for local analgesia.  A 5-cm incision was made over the left deltopectoral region.  A left subcutaneous defibrillator pocket was fashioned using a combination of sharp and blunt dissection.  Electrocautery was used to assure hemostasis. Left Upper extremity Venography:  A venogram of the left upper extremity was performed which revealed a moderate sized left axillary vein which emptied into a moderate sized left subclavian vein.  RV Lead Placement: The left axillary vein was cannulated with fluoroscopic visualization.  Through the left axillary vein,  a Medtronic, model [id number] (serial number [id number]) right ventricular defibrillator lead were advanced with fluoroscopic visualization into the  right ventricular apex.  Initial right ventricular lead R-wave measured 7.4 mV with impedance of 694 ohms and a threshold of 0.8 volts at 0.5 milliseconds. The leads were secured to the pectoralis  fascia using #2 silk suture over the suture sleeves.  The pocket then  irrigated with copious gentamicin solution.  The leads were then  connected to a Medtronic Visia AF MRI VR SureScan serial  Number [id number]) ICD.  The defibrillator was placed into the  pocket.  The pocket was then closed in 3 layers with 2.0 Vicryl suture for the subcutaneous and 3.0 Vicryl suture subcuticular layers. EBL<38ml.  Steri-Strips and a  sterile dressing were then applied.  CONCLUSIONS:  1. Ischemic cardiomyopathy with chronic New York Heart Association class II heart failure.  2. Ventricular tachycardia  3. Successful ICD implantation.  4. No early apparent complications. [name], MD 09/15/2020 5:48 PM    Discharge Medications   Allergies as of 09/16/2020      Reactions   Neomycin-bacitracin Zn-polymyx Rash   Sulfonamide Derivatives Other (See Comments)   Stomach cramps   Ciprofloxacin Hives, Itching, Rash   Spironolactone Rash  Medication List    STOP taking these medications   BD Pen Needle Nano U/F 32G X 4 MM Misc Generic drug: Insulin Pen Needle     TAKE these medications   acetaminophen 500 MG tablet Commonly known as: TYLENOL Take 500 mg by mouth every 6 (six) hours as needed for moderate pain or headache.   amiodarone 200 MG tablet Commonly known as: PACERONE Take 400mg  twice daily for 2 weeks. Then on 09/30/2020, transition to 400 mg daily for 2 weeks. Then on 10/14/2020, transition to 200 mg once daily What changed:   medication strength  additional instructions   apixaban 5 MG Tabs tablet Commonly known as: Eliquis Take 1 tablet (5 mg total) by mouth 2 (two) times daily.   eplerenone 25 MG tablet Commonly known as: INSPRA Take 1 tablet (25 mg total) by mouth daily. Start  taking on: September 17, 2020   fenofibrate 145 MG tablet Commonly known as: TRICOR TAKE 1 TABLET BY MOUTH EVERY DAY What changed: when to take this   furosemide 40 MG tablet Commonly known as: LASIX Take 1.5 tablets (60 mg total) by mouth 2 (two) times daily. Start taking on: September 17, 2020   insulin glargine 100 UNIT/ML injection Commonly known as: LANTUS Inject 15 Units into the skin at bedtime.   Livalo 4 MG Tabs Generic drug: Pitavastatin Calcium TAKE 1 TABLET BY MOUTH EVERY DAY What changed: how much to take   metoprolol succinate 25 MG 24 hr tablet Commonly known as: TOPROL-XL Take 1 tablet (25 mg total) by mouth at bedtime.   multivitamin with minerals Tabs tablet Take 1 tablet by mouth daily.   potassium chloride 10 MEQ tablet Commonly known as: Klor-Con M10 Take 2 tablets (20 mEq total) by mouth 2 (two) times daily. Start taking on: September 17, 2020   sacubitril-valsartan 24-26 MG Commonly known as: ENTRESTO Take 1 tablet by mouth 2 (two) times daily.   Vascepa 1 g capsule Generic drug: icosapent Ethyl TAKE 2 CAPSULES BY MOUTH TWICE A DAY What changed: how much to take   Xigduo XR 11-998 MG Tb24 Generic drug: Dapagliflozin-metFORMIN HCl ER Take 1 tablet by mouth in the morning and at bedtime.       Disposition   The patient will be discharged in stable condition to home.   Follow-up Information    [facility] Follow up on 09/22/2020.   Specialty: Cardiology Why: at [place] information: [address] [id number] [place] [phone]       [place] [facility] Follow up.   Specialty: Cardiology Why: 09/30/20 @ 10:00AM, wound check visit Contact information: [address] [place]       Constance Haw, MD Follow up.   Specialty: Cardiology Why: 12/20/20 @ 1:45PM Contact  information: [address] [place] [address] [phone]                 Duration of Discharge Encounter: Greater than 35 minutes   Signed, Lyda Jester, PA-C  09/16/2020, 10:12 AM

## 2020-09-21 NOTE — Progress Notes (Signed)
Advanced Heart Failure Clinic Note   PCP:  Ann Held, DO  HF Cardiologist: Dr. [name]  History of Present Illness: Heidi Stark is a 80 y.o. female who has history of HTN, DM, hyperlipidemia.  She was admitted in 4/15 with splenic and renal infarcts.  She was found to have paroxysmal atrial fibrillation.  In 4/15, she had fever and flank pain.  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  TEE showed prominent aortic plaque.  While she was in the hospital, she was noted to have a brief run of atrial fibrillation and was started on coumadin.    She has left leg pain with ambulation after about 100 feet, LE dopplers 5/15 showed occluded left external iliac artery.  This has been present ever since she had left iliac damage with a prior back surgery.    She was stable until 1/18, when she developed palpitations and exertional dyspnea.  She was admitted to [facility] with atrial fibrillation/RVR and acute systolic CHF.  EF was found to be 30-35%, down from 50-55% in 2015.  She was diuresed and underwent DCCV back to NSR.  She subsequently had atrial fibrillation ablation in 4/18.  She went back into atrial fibrillation in 5/19 and had DCCV back today NSR.   Echo was done 6/19 and reviewed, EF 55%, mild LVH, moderate diastolic dysfunction, normal RV size and systolic function.   With recurrent atrial fibrillation, she was admitted in 10/19 to start Tikosyn.  She was cardioverted back to NSR.   She had a recent admission in 2/22 with lightheadedness/presyncope that appeared to correlate with runs of VT.  She was on Tikosyn for atrial fibrillation.  Tikosyn was stopped and amiodarone was started.  cMRI showed EF 40% with subendocardial scar in the basal to mid inferolateral wall, RV EF was 38%.  She had coronary angiography showing no obstructive disease.  VT subsided and she was sent home on amiodarone.   09/13/20, she had 3 episodes of presyncope  in succession while sitting at a desk.  No prodrome, each episode of severe lightheadedness lasted maybe 30 seconds.  She did not pass out. Because of these events, she came to the ER. She was admitted for observation and amiodarone gtt was restarted.  She had no VT overnight.  She remained in atypical atrial flutter today (was in atypical flutter at last admission). She had a single chamber Medtronic ICD placed 09/15/20. Discharge weight 256 lbs.  Today she returns for HF post-hospital follow up. Last seen in clinic (6/21) with stable NYHA II symptoms with plans to follow up with EP regarding AF. After recent hospitalization (see above), overall feeling fine. Not very active due to left ankle pain, she is using a cane most of the time. Says breathing is much better. Denies increasing SOB, CP, dizziness, edema, or PND/Orthopnea. Appetite ok. No fever or chills. Weight at home 248 pounds. Taking all medications. Quit smoking this month.  Labs (4/15): K 4.1, creatinine 0.7, HCT 46.2 Labs (7/15): K 3.9, creatinine 0.8, HCT 50 Labs (12/15): K 5, creatinine 0.7, HDL 33, LDL 57 Labs (5/16): K 4, creatinine 0.84, TGs 256, HDL 29, LDL 42, HCT 41.2 Labs (1/18): K 3.4, creatinine 0.78 Labs (2/18): K 4.4, creatinine 0.8, free T3/T4 normal, mildly elevated TSH, LFTs normal, hgb 15.6 Labs (4/18): K 4.2, creatinine 0.86, TSH elevated, free T3 and free T4 normal Labs (5/19): K 3.9, creatinine 0.78 Labs (  6/19): K 4.4, creatinine 0.88 Labs (12/19): K 4.2, creatinine 0.85, TGs 322, LDL 77 Labs (2/20): LDL 46, TGs 362 Labs (3/20): K 4.7, creatinine 1.07 Labs (11/20): TGs 282, LDL 92 Labs (3/22): K 4.1, creatinine 1.23  ICD interrogation (personally reviewed): no VT/VF ECG (personally reviewed): aflutter 63 bpm, QTc 478  PMH: 1.Type II diabetes 2. HTN 3. Hyperlipidemia 4. COPD: has quit smoking.  5. CAD: Coronary CTA (4/18) with nonobstructive coronary disease, calcium score 335 Agatston units (82nd  percentile).  - Coronary angiography (2/22) with no obstructive disease/ 6. Paroxysmal atrial fibrillation: First noted in 4/15.  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   - Event monitor (4/15) with no atrial fibrillation.  - Admission with atrial fibrillation/RVR in 1/18.  - Atrial fibrillation ablation in 4/18.  - DCCV in 5/19.  - Tikosyn started + DCCV in 10/19.  - S/p single chamber MDT ICD 09/15/20 7. Chronic systolic CHF: ?tachycardia-mediated as first noted in setting of atrial fibrillation with RVR.  - TEE (4/15) with EF 50-55%, mild LVH, grade III-IV plaque in descending thoracic aorta, RV normal, no PFO, peak RV-RA gradient 42 mmHg.   - Echo (1/18) with EF 30-35%, diffuse hypokinesis, moderate LVH, mildly dilated RV, PASP 55 mmHg.  - Echo (4/18) with EF 50%, anterolateral/inferolateral mild hypokinesis, mild aortic stenosis, IVC normal.  - Echo (6/19) with EF 55%, mild LVH, moderate diastolic dysfunction, normal RV size and systolic function.  -  cMRI (2/22) with EF 40% with subendocardial scar in the basal to mid inferolateral wall, RV EF was 38%. 7. H/o diskectomy. 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 Lower extremity arterial dopplers (5/15) with occluded left external iliac artery.  - ABIs (5/18): 1.16 (normal) on right, 0.56 on left.  9. Aortic stenosis: Mild on 4/18 echo.  10. OSA: Uses CPAP  Current Outpatient Medications  Medication Sig Dispense Refill  . acetaminophen (TYLENOL) 500 MG tablet Take 500 mg by mouth every 6 (six) hours as needed for moderate pain or headache.    Marland Kitchen amiodarone (PACERONE) 200 MG tablet Take 400mg  twice daily for 2 weeks. Then on 09/30/2020, transition to 400 mg daily for 2 weeks. Then on 10/14/2020, transition to 200 mg once daily 90 tablet 1  . apixaban (ELIQUIS) 5 MG TABS tablet Take 1 tablet (5 mg total) by mouth 2 (two) times daily. 60 tablet 11  . Dapagliflozin-metFORMIN HCl ER  (XIGDUO XR) 11-998 MG TB24 Take 1 tablet by mouth in the morning and at bedtime.    Marland Kitchen eplerenone (INSPRA) 25 MG tablet Take 1 tablet (25 mg total) by mouth daily. 30 tablet 5  . fenofibrate (TRICOR) 145 MG tablet TAKE 1 TABLET BY MOUTH EVERY DAY (Patient taking differently: Take 145 mg by mouth at bedtime.) 90 tablet 3  . furosemide (LASIX) 40 MG tablet Take 1.5 tablets (60 mg total) by mouth 2 (two) times daily. 270 tablet 3  . insulin glargine (LANTUS) 100 UNIT/ML injection Inject 15 Units into the skin at bedtime.     Marland Kitchen LIVALO 4 MG TABS TAKE 1 TABLET BY MOUTH EVERY DAY (Patient taking differently: Take 4 mg by mouth daily.) 90 tablet 3  . metoprolol succinate (TOPROL-XL) 25 MG 24 hr tablet Take 1 tablet (25 mg total) by mouth at bedtime. 30 tablet 2  . Multiple Vitamin (MULTIVITAMIN WITH MINERALS) TABS tablet Take 1 tablet by mouth daily.    . potassium chloride (KLOR-CON M10) 10  MEQ tablet Take 2 tablets (20 mEq total) by mouth 2 (two) times daily. 120 tablet 11  . sacubitril-valsartan (ENTRESTO) 24-26 MG Take 1 tablet by mouth 2 (two) times daily. 60 tablet 2  . VASCEPA 1 g capsule TAKE 2 CAPSULES BY MOUTH TWICE A DAY (Patient taking differently: Take 2,000 g by mouth 2 (two) times daily.) 120 capsule 5   No current facility-administered medications for this encounter.    Allergies:   Neomycin-bacitracin zn-polymyx, Sulfonamide derivatives, Ciprofloxacin, and Spironolactone   Social History:  The patient  reports that she has quit smoking. Her smoking use included cigarettes. She has a 50.00 pack-year smoking history. She has never used smokeless tobacco. She reports that she does not drink alcohol and does not use drugs.   Family History:  The patient's family history includes Cancer in her mother; Diabetes in her father, mother, and another family member; Factor V Leiden deficiency in her daughter; Hypertension in her mother; Melanoma in an other family member; Obesity in her father and  mother.   ROS:  Please see the history of present illness.   All other systems are personally reviewed and negative.   Exam:   BP 120/78   Pulse 63   Wt 112.5 kg   SpO2 96%   BMI 40.03 kg/m  General:  NAD. No resp difficulty. Arrived in wheelchair. HEENT: Normal Neck: Supple. No JVD. Carotids 2+ bilat; no bruits. No lymphadenopathy or thryomegaly appreciated. Cor: PMI nondisplaced. Irregular rate & rhythm. No rubs, gallops or murmurs. Left chest ICD pocket w/ steri strips, C/D/I Lungs: Clear Abdomen: Obese, soft, nontender, nondistended. No hepatosplenomegaly. No bruits or masses. Good bowel sounds. Extremities: No cyanosis, clubbing, rash, edema, absent L PT pulse Neuro: Alert & oriented x 3, cranial nerves grossly intact. Moves all 4 extremities w/o difficulty. Affect pleasant.  Recent Labs: 09/07/2020: ALT 22; B Natriuretic Peptide 85.9; TSH 3.251 09/13/2020: Hemoglobin 14.3; Platelets 274 09/14/2020: Magnesium 2.2 09/16/2020: BUN 24; Creatinine, Ser 1.23; Potassium 4.1; Sodium 136  Personally reviewed   Wt Readings from Last 3 Encounters:  09/22/20 112.5 kg  09/15/20 116.2 kg  09/11/20 114.8 kg   ASSESSMENT AND PLAN:  1. H/o VT: Monomorphic VTcorrelated withepisodes of presyncope during last admission. Thiswasnot the typical Tikosyn-induced polymorphic VT. No changes in Tikosyn dosingprior to that admissionand no new meds. cMRI showedsubendocardial LGE in the basal to mid inferolateral wall. This appearedto be a coronary disease patternhowevercoronary angiogramdid not show significant coronary disease. She was transitioned from Tikosyn to amiodarone. She was readmitted with recurrent presyncope. Episodes do not seem orthostatic, she was sitting at a desk and had symptoms 3 times for about 30 seconds each.  -Continue amiodarone 400 mg bid until 3/17, then decrease to 400 mg daily x 2 weeks. On 3/31 decrease further to 200 mg daily. - S/p Medtronic single chamber ICD  3/2. 2. Atrial fibrillation/flutter: Paroxysmal. 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 She was admitted in 1/18 with atrial fibrillation/RVR and CHF. She was diuresed and had DCCV back to NSR. She had atrial fibrillation ablation in 4/18. In 5/19, she went into atrial fibrillation transiently. In 10/19, she was back in atrial fibrillation and was admitted for Tikosyn initiation and cardioversion.Atypical atrial flutter noted in 12/21 with DCCV to NSR. As outlined above, now off Tikosyn and on amiodarone. Now, back in atypical atrial flutter, rate controlled.  -D/w EP amio taper, 400 mg bid x 2 weeks>>400 mg daily x 2 weeks>>200 mg daily.  -  Per EP, ok to resume Eliquis. Has not missed any doses. CBC today. - Plan outpatient DCCV in 3 weeks. 3. Claudication: Left leg claudication with absent left PT pulse. 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  4. Chronic systolic =>diastolic CHF: EF 61-51% on echo in 1/18, down from 50-55% in 4/15. 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 Volume status stable today. - Continue lasix 60 mg bid. - Continue Toprol XL 25 mgdaily. -ContinueEntresto 24/26 mg bid. - Continue eplerenone 25 mg daily.  - Continue Farxiga 10 mg daily. BMET today. 5. CAD: Coronary CTAin 2018showed coronary calcium score in the 82nd percentile with nonobstructive coronary disease. Cathin 2/22showed only luminal irregularities.  - not on ASA due to Eliquis.  - ContinueVascepa and Livalo.  6. COPD:History of smoking. Has not quit. 7. OSA: CPAP.  DCCV in 3 weeks with Dr. [name] and then 2 weeks  follow up post-DCCV with APP.  Signed, Rafael Bihari, FNP-BC  09/22/2020  [place] [address] [facility] [place] [address] [phone] (office) [phone] (fax)

## 2020-09-21 NOTE — H&P (View-Only) (Signed)
Advanced Heart Failure Clinic Note   PCP:  Ann Held, DO  HF Cardiologist: Dr. [name]  History of Present Illness: Heidi Stark is a 80 y.o. female who has history of HTN, DM, hyperlipidemia.  She was admitted in 4/15 with splenic and renal infarcts.  She was found to have paroxysmal atrial fibrillation.  In 4/15, she had fever and flank pain.  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  TEE showed prominent aortic plaque.  While she was in the hospital, she was noted to have a brief run of atrial fibrillation and was started on coumadin.    She has left leg pain with ambulation after about 100 feet, LE dopplers 5/15 showed occluded left external iliac artery.  This has been present ever since she had left iliac damage with a prior back surgery.    She was stable until 1/18, when she developed palpitations and exertional dyspnea.  She was admitted to [facility] with atrial fibrillation/RVR and acute systolic CHF.  EF was found to be 30-35%, down from 50-55% in 2015.  She was diuresed and underwent DCCV back to NSR.  She subsequently had atrial fibrillation ablation in 4/18.  She went back into atrial fibrillation in 5/19 and had DCCV back today NSR.   Echo was done 6/19 and reviewed, EF 55%, mild LVH, moderate diastolic dysfunction, normal RV size and systolic function.   With recurrent atrial fibrillation, she was admitted in 10/19 to start Tikosyn.  She was cardioverted back to NSR.   She had a recent admission in 2/22 with lightheadedness/presyncope that appeared to correlate with runs of VT.  She was on Tikosyn for atrial fibrillation.  Tikosyn was stopped and amiodarone was started.  cMRI showed EF 40% with subendocardial scar in the basal to mid inferolateral wall, RV EF was 38%.  She had coronary angiography showing no obstructive disease.  VT subsided and she was sent home on amiodarone.   09/13/20, she had 3 episodes of presyncope  in succession while sitting at a desk.  No prodrome, each episode of severe lightheadedness lasted maybe 30 seconds.  She did not pass out. Because of these events, she came to the ER. She was admitted for observation and amiodarone gtt was restarted.  She had no VT overnight.  She remained in atypical atrial flutter today (was in atypical flutter at last admission). She had a single chamber Medtronic ICD placed 09/15/20. Discharge weight 256 lbs.  Today she returns for HF post-hospital follow up. Last seen in clinic (6/21) with stable NYHA II symptoms with plans to follow up with EP regarding AF. After recent hospitalization (see above), overall feeling fine. Not very active due to left ankle pain, she is using a cane most of the time. Says breathing is much better. Denies increasing SOB, CP, dizziness, edema, or PND/Orthopnea. Appetite ok. No fever or chills. Weight at home 248 pounds. Taking all medications. Quit smoking this month.  Labs (4/15): K 4.1, creatinine 0.7, HCT 46.2 Labs (7/15): K 3.9, creatinine 0.8, HCT 50 Labs (12/15): K 5, creatinine 0.7, HDL 33, LDL 57 Labs (5/16): K 4, creatinine 0.84, TGs 256, HDL 29, LDL 42, HCT 41.2 Labs (1/18): K 3.4, creatinine 0.78 Labs (2/18): K 4.4, creatinine 0.8, free T3/T4 normal, mildly elevated TSH, LFTs normal, hgb 15.6 Labs (4/18): K 4.2, creatinine 0.86, TSH elevated, free T3 and free T4 normal Labs (5/19): K 3.9, creatinine 0.78 Labs (  6/19): K 4.4, creatinine 0.88 Labs (12/19): K 4.2, creatinine 0.85, TGs 322, LDL 77 Labs (2/20): LDL 46, TGs 362 Labs (3/20): K 4.7, creatinine 1.07 Labs (11/20): TGs 282, LDL 92 Labs (3/22): K 4.1, creatinine 1.23  ICD interrogation (personally reviewed): no VT/VF ECG (personally reviewed): aflutter 63 bpm, QTc 478  PMH: 1.Type II diabetes 2. HTN 3. Hyperlipidemia 4. COPD: has quit smoking.  5. CAD: Coronary CTA (4/18) with nonobstructive coronary disease, calcium score 335 Agatston units (82nd  percentile).  - Coronary angiography (2/22) with no obstructive disease/ 6. Paroxysmal atrial fibrillation: First noted in 4/15.  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   - Event monitor (4/15) with no atrial fibrillation.  - Admission with atrial fibrillation/RVR in 1/18.  - Atrial fibrillation ablation in 4/18.  - DCCV in 5/19.  - Tikosyn started + DCCV in 10/19.  - S/p single chamber MDT ICD 09/15/20 7. Chronic systolic CHF: ?tachycardia-mediated as first noted in setting of atrial fibrillation with RVR.  - TEE (4/15) with EF 50-55%, mild LVH, grade III-IV plaque in descending thoracic aorta, RV normal, no PFO, peak RV-RA gradient 42 mmHg.   - Echo (1/18) with EF 30-35%, diffuse hypokinesis, moderate LVH, mildly dilated RV, PASP 55 mmHg.  - Echo (4/18) with EF 50%, anterolateral/inferolateral mild hypokinesis, mild aortic stenosis, IVC normal.  - Echo (6/19) with EF 55%, mild LVH, moderate diastolic dysfunction, normal RV size and systolic function.  -  cMRI (2/22) with EF 40% with subendocardial scar in the basal to mid inferolateral wall, RV EF was 38%. 7. H/o diskectomy. 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 Lower extremity arterial dopplers (5/15) with occluded left external iliac artery.  - ABIs (5/18): 1.16 (normal) on right, 0.56 on left.  9. Aortic stenosis: Mild on 4/18 echo.  10. OSA: Uses CPAP  Current Outpatient Medications  Medication Sig Dispense Refill  . acetaminophen (TYLENOL) 500 MG tablet Take 500 mg by mouth every 6 (six) hours as needed for moderate pain or headache.    Marland Kitchen amiodarone (PACERONE) 200 MG tablet Take 400mg  twice daily for 2 weeks. Then on 09/30/2020, transition to 400 mg daily for 2 weeks. Then on 10/14/2020, transition to 200 mg once daily 90 tablet 1  . apixaban (ELIQUIS) 5 MG TABS tablet Take 1 tablet (5 mg total) by mouth 2 (two) times daily. 60 tablet 11  . Dapagliflozin-metFORMIN HCl ER  (XIGDUO XR) 11-998 MG TB24 Take 1 tablet by mouth in the morning and at bedtime.    Marland Kitchen eplerenone (INSPRA) 25 MG tablet Take 1 tablet (25 mg total) by mouth daily. 30 tablet 5  . fenofibrate (TRICOR) 145 MG tablet TAKE 1 TABLET BY MOUTH EVERY DAY (Patient taking differently: Take 145 mg by mouth at bedtime.) 90 tablet 3  . furosemide (LASIX) 40 MG tablet Take 1.5 tablets (60 mg total) by mouth 2 (two) times daily. 270 tablet 3  . insulin glargine (LANTUS) 100 UNIT/ML injection Inject 15 Units into the skin at bedtime.     Marland Kitchen LIVALO 4 MG TABS TAKE 1 TABLET BY MOUTH EVERY DAY (Patient taking differently: Take 4 mg by mouth daily.) 90 tablet 3  . metoprolol succinate (TOPROL-XL) 25 MG 24 hr tablet Take 1 tablet (25 mg total) by mouth at bedtime. 30 tablet 2  . Multiple Vitamin (MULTIVITAMIN WITH MINERALS) TABS tablet Take 1 tablet by mouth daily.    . potassium chloride (KLOR-CON M10) 10  MEQ tablet Take 2 tablets (20 mEq total) by mouth 2 (two) times daily. 120 tablet 11  . sacubitril-valsartan (ENTRESTO) 24-26 MG Take 1 tablet by mouth 2 (two) times daily. 60 tablet 2  . VASCEPA 1 g capsule TAKE 2 CAPSULES BY MOUTH TWICE A DAY (Patient taking differently: Take 2,000 g by mouth 2 (two) times daily.) 120 capsule 5   No current facility-administered medications for this encounter.    Allergies:   Neomycin-bacitracin zn-polymyx, Sulfonamide derivatives, Ciprofloxacin, and Spironolactone   Social History:  The patient  reports that she has quit smoking. Her smoking use included cigarettes. She has a 50.00 pack-year smoking history. She has never used smokeless tobacco. She reports that she does not drink alcohol and does not use drugs.   Family History:  The patient's family history includes Cancer in her mother; Diabetes in her father, mother, and another family member; Factor V Leiden deficiency in her daughter; Hypertension in her mother; Melanoma in an other family member; Obesity in her father and  mother.   ROS:  Please see the history of present illness.   All other systems are personally reviewed and negative.   Exam:   BP 120/78   Pulse 63   Wt 112.5 kg   SpO2 96%   BMI 40.03 kg/m  General:  NAD. No resp difficulty. Arrived in wheelchair. HEENT: Normal Neck: Supple. No JVD. Carotids 2+ bilat; no bruits. No lymphadenopathy or thryomegaly appreciated. Cor: PMI nondisplaced. Irregular rate & rhythm. No rubs, gallops or murmurs. Left chest ICD pocket w/ steri strips, C/D/I Lungs: Clear Abdomen: Obese, soft, nontender, nondistended. No hepatosplenomegaly. No bruits or masses. Good bowel sounds. Extremities: No cyanosis, clubbing, rash, edema, absent L PT pulse Neuro: Alert & oriented x 3, cranial nerves grossly intact. Moves all 4 extremities w/o difficulty. Affect pleasant.  Recent Labs: 09/07/2020: ALT 22; B Natriuretic Peptide 85.9; TSH 3.251 09/13/2020: Hemoglobin 14.3; Platelets 274 09/14/2020: Magnesium 2.2 09/16/2020: BUN 24; Creatinine, Ser 1.23; Potassium 4.1; Sodium 136  Personally reviewed   Wt Readings from Last 3 Encounters:  09/22/20 112.5 kg  09/15/20 116.2 kg  09/11/20 114.8 kg   ASSESSMENT AND PLAN:  1. H/o VT: Monomorphic VTcorrelated withepisodes of presyncope during last admission. Thiswasnot the typical Tikosyn-induced polymorphic VT. No changes in Tikosyn dosingprior to that admissionand no new meds. cMRI showedsubendocardial LGE in the basal to mid inferolateral wall. This appearedto be a coronary disease patternhowevercoronary angiogramdid not show significant coronary disease. She was transitioned from Tikosyn to amiodarone. She was readmitted with recurrent presyncope. Episodes do not seem orthostatic, she was sitting at a desk and had symptoms 3 times for about 30 seconds each.  -Continue amiodarone 400 mg bid until 3/17, then decrease to 400 mg daily x 2 weeks. On 3/31 decrease further to 200 mg daily. - S/p Medtronic single chamber ICD  3/2. 2. Atrial fibrillation/flutter: Paroxysmal. 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 She was admitted in 1/18 with atrial fibrillation/RVR and CHF. She was diuresed and had DCCV back to NSR. She had atrial fibrillation ablation in 4/18. In 5/19, she went into atrial fibrillation transiently. In 10/19, she was back in atrial fibrillation and was admitted for Tikosyn initiation and cardioversion.Atypical atrial flutter noted in 12/21 with DCCV to NSR. As outlined above, now off Tikosyn and on amiodarone. Now, back in atypical atrial flutter, rate controlled.  -D/w EP amio taper, 400 mg bid x 2 weeks>>400 mg daily x 2 weeks>>200 mg daily.  -  Per EP, ok to resume Eliquis. Has not missed any doses. CBC today. - Plan outpatient DCCV in 3 weeks. 3. Claudication: Left leg claudication with absent left PT pulse. 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  4. Chronic systolic =>diastolic CHF: EF 76-70% on echo in 1/18, down from 50-55% in 4/15. 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 Volume status stable today. - Continue lasix 60 mg bid. - Continue Toprol XL 25 mgdaily. -ContinueEntresto 24/26 mg bid. - Continue eplerenone 25 mg daily.  - Continue Farxiga 10 mg daily. BMET today. 5. CAD: Coronary CTAin 2018showed coronary calcium score in the 82nd percentile with nonobstructive coronary disease. Cathin 2/22showed only luminal irregularities.  - not on ASA due to Eliquis.  - ContinueVascepa and Livalo.  6. COPD:History of smoking. Has not quit. 7. OSA: CPAP.  DCCV in 3 weeks with Dr. [name] and then 2 weeks  follow up post-DCCV with APP.  Signed, Rafael Bihari, FNP-BC  09/22/2020  [place] [address] [facility] [place] [address] [phone] (office) [phone] (fax)

## 2020-09-22 ENCOUNTER — Other Ambulatory Visit (HOSPITAL_COMMUNITY): Payer: Self-pay

## 2020-09-22 ENCOUNTER — Ambulatory Visit (HOSPITAL_COMMUNITY)
Admit: 2020-09-22 | Discharge: 2020-09-22 | Disposition: A | Discharge status: Home / Self Care | Payer: Medicare Other | Source: Ambulatory Visit | Attending: Family Medicine | Admitting: Family Medicine

## 2020-09-22 ENCOUNTER — Encounter (HOSPITAL_COMMUNITY): Payer: Self-pay

## 2020-09-22 ENCOUNTER — Other Ambulatory Visit: Payer: Self-pay

## 2020-09-22 VITALS — BP 120/78 | HR 63 | Wt 248.0 lb

## 2020-09-22 DIAGNOSIS — I4819 Other persistent atrial fibrillation: Secondary | ICD-10-CM | POA: Diagnosis not present

## 2020-09-22 DIAGNOSIS — Z8249 Family history of ischemic heart disease and other diseases of the circulatory system: Secondary | ICD-10-CM | POA: Diagnosis not present

## 2020-09-22 DIAGNOSIS — M25572 Pain in left ankle and joints of left foot: Secondary | ICD-10-CM | POA: Insufficient documentation

## 2020-09-22 DIAGNOSIS — Z882 Allergy status to sulfonamides status: Secondary | ICD-10-CM | POA: Diagnosis not present

## 2020-09-22 DIAGNOSIS — I480 Paroxysmal atrial fibrillation: Secondary | ICD-10-CM | POA: Diagnosis not present

## 2020-09-22 DIAGNOSIS — I472 Ventricular tachycardia: Secondary | ICD-10-CM | POA: Insufficient documentation

## 2020-09-22 DIAGNOSIS — Z7901 Long term (current) use of anticoagulants: Secondary | ICD-10-CM | POA: Diagnosis not present

## 2020-09-22 DIAGNOSIS — Z794 Long term (current) use of insulin: Secondary | ICD-10-CM | POA: Diagnosis not present

## 2020-09-22 DIAGNOSIS — I484 Atypical atrial flutter: Secondary | ICD-10-CM | POA: Diagnosis not present

## 2020-09-22 DIAGNOSIS — I2510 Atherosclerotic heart disease of native coronary artery without angina pectoris: Secondary | ICD-10-CM | POA: Diagnosis not present

## 2020-09-22 DIAGNOSIS — Z87891 Personal history of nicotine dependence: Secondary | ICD-10-CM | POA: Diagnosis not present

## 2020-09-22 DIAGNOSIS — Z881 Allergy status to other antibiotic agents status: Secondary | ICD-10-CM | POA: Insufficient documentation

## 2020-09-22 DIAGNOSIS — E1151 Type 2 diabetes mellitus with diabetic peripheral angiopathy without gangrene: Secondary | ICD-10-CM | POA: Diagnosis not present

## 2020-09-22 DIAGNOSIS — G4733 Obstructive sleep apnea (adult) (pediatric): Secondary | ICD-10-CM | POA: Diagnosis not present

## 2020-09-22 DIAGNOSIS — Z95810 Presence of automatic (implantable) cardiac defibrillator: Secondary | ICD-10-CM | POA: Diagnosis not present

## 2020-09-22 DIAGNOSIS — I5042 Chronic combined systolic (congestive) and diastolic (congestive) heart failure: Secondary | ICD-10-CM | POA: Diagnosis not present

## 2020-09-22 DIAGNOSIS — Z833 Family history of diabetes mellitus: Secondary | ICD-10-CM | POA: Insufficient documentation

## 2020-09-22 DIAGNOSIS — I5022 Chronic systolic (congestive) heart failure: Secondary | ICD-10-CM

## 2020-09-22 DIAGNOSIS — Z79899 Other long term (current) drug therapy: Secondary | ICD-10-CM | POA: Diagnosis not present

## 2020-09-22 DIAGNOSIS — J449 Chronic obstructive pulmonary disease, unspecified: Secondary | ICD-10-CM

## 2020-09-22 DIAGNOSIS — Z8679 Personal history of other diseases of the circulatory system: Secondary | ICD-10-CM | POA: Diagnosis not present

## 2020-09-22 DIAGNOSIS — R55 Syncope and collapse: Secondary | ICD-10-CM | POA: Insufficient documentation

## 2020-09-22 DIAGNOSIS — I739 Peripheral vascular disease, unspecified: Secondary | ICD-10-CM

## 2020-09-22 DIAGNOSIS — I110 Hypertensive heart disease with heart failure: Secondary | ICD-10-CM | POA: Diagnosis not present

## 2020-09-22 LAB — BASIC METABOLIC PANEL
Anion gap: 15 (ref 5–15)
BUN: 27 mg/dL — ABNORMAL HIGH (ref 8–23)
CO2: 25 mmol/L (ref 22–32)
Calcium: 9.3 mg/dL (ref 8.9–10.3)
Chloride: 95 mmol/L — ABNORMAL LOW (ref 98–111)
Creatinine, Ser: 1.22 mg/dL — ABNORMAL HIGH (ref 0.44–1.00)
GFR, Estimated: 45 mL/min — ABNORMAL LOW (ref >60)
Glucose, Bld: 150 mg/dL — ABNORMAL HIGH (ref 70–99)
Potassium: 4.2 mmol/L (ref 3.5–5.1)
Sodium: 135 mmol/L (ref 135–145)

## 2020-09-22 LAB — CBC
HCT: 49 % — ABNORMAL HIGH (ref 36.0–46.0)
Hemoglobin: 16 g/dL — ABNORMAL HIGH (ref 12.0–15.0)
MCH: 30.7 pg (ref 26.0–34.0)
MCHC: 32.7 g/dL (ref 30.0–36.0)
MCV: 93.9 fL (ref 80.0–100.0)
Platelets: 255 10*3/uL (ref 150–400)
RBC: 5.22 MIL/uL — ABNORMAL HIGH (ref 3.87–5.11)
RDW: 15 % (ref 11.5–15.5)
WBC: 8.8 10*3/uL (ref 4.0–10.5)
nRBC: 0 % (ref 0.0–0.2)

## 2020-09-22 NOTE — Patient Instructions (Addendum)
DECREASE Amiodarone 400mg  (1 tab) daily on 09/30/20  THEN, DECREASE Amiodarone to 200mg  (1/2 tab) daily on 10/13/20  Labs today We will only contact you if something comes back abnormal or we need to make some changes. Otherwise no news is good news!  Your physician has recommended that you have a Cardioversion (DCCV). Electrical Cardioversion uses a jolt of electricity to your heart either through paddles or wired patches attached to your chest. This is a controlled, usually prescheduled, procedure. Defibrillation is done under light anesthesia in the hospital, and you usually go home the day of the procedure. This is done to get your heart back into a normal rhythm. You are not awake for the procedure. Please see the instruction sheet given to you today.  Dear Heidi Stark,  You are scheduled for a Cardioversion on Monday March 28th, 2022 with Dr. [name].  Please arrive at the [facility] (Main Entrance A) at [facility]: [address] [place], [place] [address] at 11 am.   DIET: Nothing to eat or drink after midnight except a sip of water with medications (see medication instructions below)  Medication Instructions:  Hold All diabetes medications on the morning of your procedure. Hold Lantus the night before your procedure.  Continue your anticoagulant: Eliquis  You will need to continue your anticoagulant after your procedure until you are told by your  Provider that it is safe to stop   Labs:   You will need a pre procedure COVID test     WHEN:  Saturday, March 26th, 2022 at [facility] [address] [place], [place] [address]  This is a drive thru testing site, you will remain in your car. Be sure to get in the line FOR PROCEDURES Once you have been swabbed you will need to remain home in quarantine until you return for your procedure.   You must have a responsible person to drive you home and stay in the waiting area during your procedure. Failure to do  so could result in cancellation.  Bring your insurance cards.  *Special Note: Every effort is made to have your procedure done on time. Occasionally there are emergencies that occur at the hospital that may cause delays. Please be patient if a delay does occur.   Your physician recommends that you schedule a follow-up appointment in: 2 weeks after your Cardioversion  Please call office at [phone] option 2 if you have any questions or concerns.   At the [facility], you and your health needs are our priority. As part of our continuing mission to provide you with exceptional heart care, we have created designated Provider Care Teams. These Care Teams include your primary Cardiologist (physician) and Advanced Practice Providers (APPs- Physician Assistants and Nurse Practitioners) who all work together to provide you with the care you need, when you need it.   You may see any of the following providers on your designated Care Team at your next follow up: Marland Kitchen Dr Glori Bickers . Dr [name] . Dr Vickki Muff . Darrick Grinder, NP . Lyda Jester, [place] . Audry Riles, PharmD   Please be sure to bring in all your medications bottles to every appointment.

## 2020-09-30 ENCOUNTER — Other Ambulatory Visit: Payer: Self-pay

## 2020-09-30 ENCOUNTER — Ambulatory Visit (INDEPENDENT_AMBULATORY_CARE_PROVIDER_SITE_OTHER): Payer: Medicare Other | Admitting: Emergency Medicine

## 2020-09-30 DIAGNOSIS — I472 Ventricular tachycardia, unspecified: Secondary | ICD-10-CM

## 2020-09-30 LAB — CUP PACEART INCLINIC DEVICE CHECK
Battery Remaining Longevity: 136 mo
Battery Voltage: 3.14 V
Brady Statistic RV Percent Paced: 0.13 %
Date Time Interrogation Session: 20220317102718
HighPow Impedance: 83 Ohm
Implantable Lead Implant Date: 20220302
Implantable Lead Location: 753860
Implantable Pulse Generator Implant Date: 20220302
Lead Channel Impedance Value: 342 Ohm
Lead Channel Impedance Value: 437 Ohm
Lead Channel Pacing Threshold Amplitude: 0.75 V
Lead Channel Pacing Threshold Pulse Width: 0.4 ms
Lead Channel Sensing Intrinsic Amplitude: 8.875 mV
Lead Channel Setting Pacing Amplitude: 3.5 V
Lead Channel Setting Pacing Pulse Width: 0.4 ms
Lead Channel Setting Sensing Sensitivity: 0.3 mV

## 2020-09-30 NOTE — Progress Notes (Signed)
Wound check appointment. Steri-strips removed. Wound without redness or edema, minimal bruising noted. Incision edges approximated, wound well healed. Normal device function. Thresholds, sensing, and impedances consistent with implant measurements. Device programmed at 3.5V for extra safety margin until 3 month visit. Histogram distribution appropriate for patient and level of activity. Ventricular arrhythmias noted. 1 AF episode noted 09/23/20 lasting 10 min. Patient is scheduled for DCCV with HF clinic.  Patient educated about wound care, arm mobility, lifting restrictions, shock plan. ROV with Dr. [name] 12/20/20. Patient is enrolled in remotes monitoring. Educated on device set up. Next scheduled check 12/16/20.

## 2020-10-05 ENCOUNTER — Encounter (HOSPITAL_COMMUNITY): Payer: Self-pay

## 2020-10-05 ENCOUNTER — Encounter (HOSPITAL_COMMUNITY): Payer: Medicare Other

## 2020-10-06 ENCOUNTER — Other Ambulatory Visit: Payer: Self-pay | Admitting: Cardiology

## 2020-10-09 ENCOUNTER — Other Ambulatory Visit (HOSPITAL_COMMUNITY)
Admission: RE | Admit: 2020-10-09 | Discharge: 2020-10-09 | Disposition: A | Discharge status: Home / Self Care | Payer: Medicare Other | Source: Ambulatory Visit | Attending: Cardiology | Admitting: Cardiology

## 2020-10-09 DIAGNOSIS — Z01812 Encounter for preprocedural laboratory examination: Secondary | ICD-10-CM | POA: Insufficient documentation

## 2020-10-09 DIAGNOSIS — Z20822 Contact with and (suspected) exposure to covid-19: Secondary | ICD-10-CM | POA: Diagnosis not present

## 2020-10-09 LAB — SARS CORONAVIRUS 2 (TAT 6-24 HRS): SARS Coronavirus 2: NEGATIVE

## 2020-10-11 ENCOUNTER — Ambulatory Visit (HOSPITAL_COMMUNITY): Payer: Medicare Other | Admitting: Anesthesiology

## 2020-10-11 ENCOUNTER — Encounter (HOSPITAL_COMMUNITY)
Admission: RE | Disposition: A | Discharge status: Home / Self Care | Payer: Self-pay | Source: Ambulatory Visit | Attending: Cardiology

## 2020-10-11 ENCOUNTER — Other Ambulatory Visit: Payer: Self-pay

## 2020-10-11 ENCOUNTER — Ambulatory Visit (HOSPITAL_COMMUNITY)
Admission: RE | Admit: 2020-10-11 | Discharge: 2020-10-11 | Disposition: A | Discharge status: Home / Self Care | Payer: Medicare Other | Source: Ambulatory Visit | Attending: Cardiology | Admitting: Cardiology

## 2020-10-11 ENCOUNTER — Encounter (HOSPITAL_COMMUNITY): Payer: Self-pay | Admitting: Cardiology

## 2020-10-11 DIAGNOSIS — I2510 Atherosclerotic heart disease of native coronary artery without angina pectoris: Secondary | ICD-10-CM | POA: Insufficient documentation

## 2020-10-11 DIAGNOSIS — Z881 Allergy status to other antibiotic agents status: Secondary | ICD-10-CM | POA: Diagnosis not present

## 2020-10-11 DIAGNOSIS — Z882 Allergy status to sulfonamides status: Secondary | ICD-10-CM | POA: Diagnosis not present

## 2020-10-11 DIAGNOSIS — I110 Hypertensive heart disease with heart failure: Secondary | ICD-10-CM | POA: Insufficient documentation

## 2020-10-11 DIAGNOSIS — I484 Atypical atrial flutter: Secondary | ICD-10-CM | POA: Diagnosis not present

## 2020-10-11 DIAGNOSIS — I5042 Chronic combined systolic (congestive) and diastolic (congestive) heart failure: Secondary | ICD-10-CM | POA: Diagnosis not present

## 2020-10-11 DIAGNOSIS — Z95810 Presence of automatic (implantable) cardiac defibrillator: Secondary | ICD-10-CM | POA: Diagnosis not present

## 2020-10-11 DIAGNOSIS — E1151 Type 2 diabetes mellitus with diabetic peripheral angiopathy without gangrene: Secondary | ICD-10-CM | POA: Insufficient documentation

## 2020-10-11 DIAGNOSIS — Z87891 Personal history of nicotine dependence: Secondary | ICD-10-CM | POA: Diagnosis not present

## 2020-10-11 DIAGNOSIS — G4733 Obstructive sleep apnea (adult) (pediatric): Secondary | ICD-10-CM | POA: Insufficient documentation

## 2020-10-11 DIAGNOSIS — I480 Paroxysmal atrial fibrillation: Secondary | ICD-10-CM | POA: Insufficient documentation

## 2020-10-11 DIAGNOSIS — Z794 Long term (current) use of insulin: Secondary | ICD-10-CM | POA: Diagnosis not present

## 2020-10-11 DIAGNOSIS — Z8249 Family history of ischemic heart disease and other diseases of the circulatory system: Secondary | ICD-10-CM | POA: Insufficient documentation

## 2020-10-11 DIAGNOSIS — Z833 Family history of diabetes mellitus: Secondary | ICD-10-CM | POA: Insufficient documentation

## 2020-10-11 DIAGNOSIS — I4892 Unspecified atrial flutter: Secondary | ICD-10-CM | POA: Diagnosis not present

## 2020-10-11 DIAGNOSIS — Z79899 Other long term (current) drug therapy: Secondary | ICD-10-CM | POA: Insufficient documentation

## 2020-10-11 DIAGNOSIS — J449 Chronic obstructive pulmonary disease, unspecified: Secondary | ICD-10-CM | POA: Diagnosis not present

## 2020-10-11 DIAGNOSIS — Z7901 Long term (current) use of anticoagulants: Secondary | ICD-10-CM | POA: Diagnosis not present

## 2020-10-11 HISTORY — PX: CARDIOVERSION: SHX1299

## 2020-10-11 LAB — POCT I-STAT, CHEM 8
BUN: 28 mg/dL — ABNORMAL HIGH (ref 8–23)
Calcium, Ion: 0.99 mmol/L — ABNORMAL LOW (ref 1.15–1.40)
Chloride: 99 mmol/L (ref 98–111)
Creatinine, Ser: 1.4 mg/dL — ABNORMAL HIGH (ref 0.44–1.00)
Glucose, Bld: 161 mg/dL — ABNORMAL HIGH (ref 70–99)
HCT: 48 % — ABNORMAL HIGH (ref 36.0–46.0)
Hemoglobin: 16.3 g/dL — ABNORMAL HIGH (ref 12.0–15.0)
Potassium: 4.3 mmol/L (ref 3.5–5.1)
Sodium: 137 mmol/L (ref 135–145)
TCO2: 28 mmol/L (ref 22–32)

## 2020-10-11 SURGERY — CARDIOVERSION
Anesthesia: General

## 2020-10-11 MED ORDER — LIDOCAINE HCL (CARDIAC) PF 100 MG/5ML IV SOSY
PREFILLED_SYRINGE | INTRAVENOUS | Status: DC | PRN
  Administered 2020-10-11: 60 mg via INTRATRACHEAL

## 2020-10-11 MED ORDER — SODIUM CHLORIDE 0.9 % IV SOLN: INTRAVENOUS | Status: DC | PRN

## 2020-10-11 MED ORDER — PROPOFOL 10 MG/ML IV BOLUS
INTRAVENOUS | Status: DC | PRN
Start: 2020-10-11 — End: 2020-10-11
  Administered 2020-10-11: 50 mg via INTRAVENOUS

## 2020-10-11 NOTE — Anesthesia Procedure Notes (Signed)
Procedure Name: General with mask airway Date/Time: 10/11/2020 1:37 PM Performed by: Kathryne Hitch, CRNA Pre-anesthesia Checklist: Emergency Drugs available, Patient identified, Patient being monitored and Suction available Patient Re-evaluated:Patient Re-evaluated prior to induction Oxygen Delivery Method: Ambu bag Preoxygenation: Pre-oxygenation with 100% oxygen Induction Type: IV induction Ventilation: Mask ventilation without difficulty Placement Confirmation: positive ETCO2 Dental Injury: Teeth and Oropharynx as per pre-operative assessment

## 2020-10-11 NOTE — Anesthesia Preprocedure Evaluation (Addendum)
Anesthesia Evaluation  Patient identified by MRN, date of birth, ID band Patient awake    Reviewed: Allergy & Precautions, H&P , NPO status , Patient's Chart, lab work & pertinent test results, reviewed documented beta blocker date and time   Airway Mallampati: II  TM Distance: >3 FB Neck ROM: Full    Dental no notable dental hx. (+) Upper Dentures, Lower Dentures, Dental Advisory Given   Pulmonary sleep apnea and Continuous Positive Airway Pressure Ventilation , COPD, former smoker,    Pulmonary exam normal breath sounds clear to auscultation       Cardiovascular hypertension, Pt. on medications and Pt. on home beta blockers + Peripheral Vascular Disease and +CHF  + dysrhythmias Atrial Fibrillation  Rhythm:Irregular Rate:Normal     Neuro/Psych negative neurological ROS  negative psych ROS   GI/Hepatic negative GI ROS, Neg liver ROS,   Endo/Other  diabetes, Insulin Dependent  Renal/GU negative Renal ROS  negative genitourinary   Musculoskeletal  (+) Arthritis , Osteoarthritis,    Abdominal   Peds  Hematology negative hematology ROS (+)   Anesthesia Other Findings   Reproductive/Obstetrics negative OB ROS                            Anesthesia Physical Anesthesia Plan  ASA: III  Anesthesia Plan: General   Post-op Pain Management:    Induction: Intravenous  PONV Risk Score and Plan: 3 and Propofol infusion and Treatment may vary due to age or medical condition  Airway Management Planned: Mask  Additional Equipment:   Intra-op Plan:   Post-operative Plan:   Informed Consent: I have reviewed the patients History and Physical, chart, labs and discussed the procedure including the risks, benefits and alternatives for the proposed anesthesia with the patient or authorized representative who has indicated his/her understanding and acceptance.     Dental advisory given  Plan  Discussed with: CRNA  Anesthesia Plan Comments:         Anesthesia Quick Evaluation

## 2020-10-11 NOTE — Anesthesia Postprocedure Evaluation (Signed)
Anesthesia Post Note  Patient: Heidi Stark  Procedure(s) Performed: CARDIOVERSION (N/A )     Patient location during evaluation: Endoscopy Anesthesia Type: General Level of consciousness: awake and alert Pain management: pain level controlled Vital Signs Assessment: post-procedure vital signs reviewed and stable Respiratory status: spontaneous breathing, nonlabored ventilation and respiratory function stable Cardiovascular status: blood pressure returned to baseline and stable Postop Assessment: no apparent nausea or vomiting Anesthetic complications: no   No complications documented.  Last Vitals:  Vitals:   10/11/20 1400 10/11/20 1410  BP: (!) 93/40 (!) 111/44  Pulse: (!) 48 (!) 51  Resp: 15 16  Temp: 36.6 C   SpO2: 96% 95%    Last Pain:  Vitals:   10/11/20 1410  TempSrc:   PainSc: 0-No pain                 Kerryann Allaire,W. EDMOND

## 2020-10-11 NOTE — Discharge Instructions (Signed)
Electrical Cardioversion Electrical cardioversion is the delivery of a jolt of electricity to restore a normal rhythm to the heart. A rhythm that is too fast or is not regular keeps the heart from pumping well. In this procedure, sticky patches or metal paddles are placed on the chest to deliver electricity to the heart from a device. This procedure may be done in an emergency if:  There is low or no blood pressure as a result of the heart rhythm.  Normal rhythm must be restored as fast as possible to protect the brain and heart from further damage.  It may save a life. This may also be a scheduled procedure for irregular or fast heart rhythms that are not immediately life-threatening. Tell a health care provider about:  Any allergies you have.  All medicines you are taking, including vitamins, herbs, eye drops, creams, and over-the-counter medicines.  Any problems you or family members have had with anesthetic medicines.  Any blood disorders you have.  Any surgeries you have had.  Any medical conditions you have.  Whether you are pregnant or may be pregnant. What are the risks? Generally, this is a safe procedure. However, problems may occur, including:  Allergic reactions to medicines.  A blood clot that breaks free and travels to other parts of your body.  The possible return of an abnormal heart rhythm within hours or days after the procedure.  Your heart stopping (cardiac arrest). This is rare. What happens before the procedure? Medicines  Your health care provider may have you start taking: ? Blood-thinning medicines (anticoagulants) so your blood does not clot as easily. ? Medicines to help stabilize your heart rate and rhythm.  Ask your health care provider about: ? Changing or stopping your regular medicines. This is especially important if you are taking diabetes medicines or blood thinners. ? Taking medicines such as aspirin and ibuprofen. These medicines can  thin your blood. Do not take these medicines unless your health care provider tells you to take them. ? Taking over-the-counter medicines, vitamins, herbs, and supplements. General instructions  Follow instructions from your health care provider about eating or drinking restrictions.  Plan to have someone take you home from the hospital or clinic.  If you will be going home right after the procedure, plan to have someone with you for 24 hours.  Ask your health care provider what steps will be taken to help prevent infection. These may include washing your skin with a germ-killing soap. What happens during the procedure?  An IV will be inserted into one of your veins.  Sticky patches (electrodes) or metal paddles may be placed on your chest.  You will be given a medicine to help you relax (sedative).  An electrical shock will be delivered. The procedure may vary among health care providers and hospitals.   What can I expect after the procedure?  Your blood pressure, heart rate, breathing rate, and blood oxygen level will be monitored until you leave the hospital or clinic.  Your heart rhythm will be watched to make sure it does not change.  You may have some redness on the skin where the shocks were given. Follow these instructions at home:  Do not drive for 24 hours if you were given a sedative during your procedure.  Take over-the-counter and prescription medicines only as told by your health care provider.  Ask your health care provider how to check your pulse. Check it often.  Rest for 48 hours after the procedure   or as told by your health care provider.  Avoid or limit your caffeine use as told by your health care provider.  Keep all follow-up visits as told by your health care provider. This is important. Contact a health care provider if:  You feel like your heart is beating too quickly or your pulse is not regular.  You have a serious muscle cramp that does not go  away. Get help right away if:  You have discomfort in your chest.  You are dizzy or you feel faint.  You have trouble breathing or you are short of breath.  Your speech is slurred.  You have trouble moving an arm or leg on one side of your body.  Your fingers or toes turn cold or blue. Summary  Electrical cardioversion is the delivery of a jolt of electricity to restore a normal rhythm to the heart.  This procedure may be done right away in an emergency or may be a scheduled procedure if the condition is not an emergency.  Generally, this is a safe procedure.  After the procedure, check your pulse often as told by your health care provider. This information is not intended to replace advice given to you by your health care provider. Make sure you discuss any questions you have with your health care provider. Document Revised: 02/03/2019 Document Reviewed: 02/03/2019 Elsevier Patient Education  2021 Elsevier Inc.  

## 2020-10-11 NOTE — Interval H&P Note (Signed)
History and Physical Interval Note:  10/11/2020 1:49 PM  Heidi Stark  has presented today for surgery, with the diagnosis of AFIB.  The various methods of treatment have been discussed with the patient and family. After consideration of risks, benefits and other options for treatment, the patient has consented to  Procedure(s): CARDIOVERSION (N/A) as a surgical intervention.  The patient's history has been reviewed, patient examined, no change in status, stable for surgery.  I have reviewed the patient's chart and labs.  Questions were answered to the patient's satisfaction.     Costa Jha [employer]

## 2020-10-11 NOTE — Procedures (Signed)
Electrical Cardioversion Procedure Note Heidi Stark [id number] [birth year]  Procedure: Electrical Cardioversion Indications:  Atrial Flutter  Procedure Details Consent: Risks of procedure as well as the alternatives and risks of each were explained to the (patient/caregiver).  Consent for procedure obtained. Time Out: Verified patient identification, verified procedure, site/side was marked, verified correct patient position, special equipment/implants available, medications/allergies/relevent history reviewed, required imaging and test results available.  Performed  Patient placed on cardiac monitor, pulse oximetry, supplemental oxygen as necessary.  Sedation given: Propofol per anesthesiology Pacer pads placed anterior and posterior chest.  Cardioverted 1 time(s).  Cardioverted at [place].  Evaluation Findings: Post procedure EKG shows: NSR Complications: None Patient did tolerate procedure well.   [name] 10/11/2020, 1:53 PM

## 2020-10-11 NOTE — Transfer of Care (Signed)
Immediate Anesthesia Transfer of Care Note  Patient: Heidi Stark  Procedure(s) Performed: CARDIOVERSION (N/A )  Patient Location: Endoscopy Unit  Anesthesia Type:General  Level of Consciousness: drowsy and patient cooperative  Airway & Oxygen Therapy: Patient Spontanous Breathing  Post-op Assessment: Report given to RN and Post -op Vital signs reviewed and stable  Post vital signs: Reviewed and stable  Last Vitals:  Vitals Value Taken Time  BP 108/53   Temp    Pulse 51   Resp 18   SpO2 90     Last Pain:  Vitals:   10/11/20 1125  TempSrc: Oral  PainSc: 0-No pain         Complications: No complications documented.

## 2020-10-12 ENCOUNTER — Encounter (HOSPITAL_COMMUNITY): Payer: Self-pay | Admitting: Cardiology

## 2020-10-26 LAB — LIPID PANEL
Cholesterol: 113 (ref 0–200)
HDL: 29 — AB (ref 35–70)
LDL Cholesterol: 53
Triglycerides: 184 — AB (ref 40–160)

## 2020-10-26 LAB — COMPREHENSIVE METABOLIC PANEL: Calcium: 9 (ref 8.7–10.7)

## 2020-10-26 LAB — BASIC METABOLIC PANEL
BUN: 18 (ref 4–21)
CO2: 23 — AB (ref 13–22)
Chloride: 94 — AB (ref 99–108)
Creatinine: 1.2 — AB (ref 0.5–1.1)
Glucose: 160
Potassium: 4.7 (ref 3.4–5.3)
Sodium: 139 (ref 137–147)

## 2020-10-26 LAB — HEPATIC FUNCTION PANEL
ALT: 17 (ref 7–35)
AST: 22 (ref 13–35)
Alkaline Phosphatase: 42 (ref 25–125)

## 2020-10-26 LAB — HEMOGLOBIN A1C: Hemoglobin A1C: 8.9

## 2020-10-26 LAB — MICROALBUMIN, URINE: Microalb, Ur: 12.8

## 2020-10-28 ENCOUNTER — Ambulatory Visit (HOSPITAL_COMMUNITY)
Admission: RE | Admit: 2020-10-28 | Discharge: 2020-10-28 | Disposition: A | Discharge status: Home / Self Care | Payer: Medicare Other | Source: Ambulatory Visit | Attending: Cardiology | Admitting: Cardiology

## 2020-10-28 ENCOUNTER — Other Ambulatory Visit: Payer: Self-pay

## 2020-10-28 ENCOUNTER — Encounter (HOSPITAL_COMMUNITY): Payer: Self-pay

## 2020-10-28 VITALS — HR 58 | Wt 253.0 lb

## 2020-10-28 DIAGNOSIS — Z7984 Long term (current) use of oral hypoglycemic drugs: Secondary | ICD-10-CM | POA: Insufficient documentation

## 2020-10-28 DIAGNOSIS — Z794 Long term (current) use of insulin: Secondary | ICD-10-CM | POA: Diagnosis not present

## 2020-10-28 DIAGNOSIS — Z95810 Presence of automatic (implantable) cardiac defibrillator: Secondary | ICD-10-CM | POA: Insufficient documentation

## 2020-10-28 DIAGNOSIS — I480 Paroxysmal atrial fibrillation: Secondary | ICD-10-CM | POA: Insufficient documentation

## 2020-10-28 DIAGNOSIS — Z881 Allergy status to other antibiotic agents status: Secondary | ICD-10-CM | POA: Insufficient documentation

## 2020-10-28 DIAGNOSIS — Z7901 Long term (current) use of anticoagulants: Secondary | ICD-10-CM | POA: Diagnosis not present

## 2020-10-28 DIAGNOSIS — I5042 Chronic combined systolic (congestive) and diastolic (congestive) heart failure: Secondary | ICD-10-CM | POA: Diagnosis not present

## 2020-10-28 DIAGNOSIS — E785 Hyperlipidemia, unspecified: Secondary | ICD-10-CM | POA: Insufficient documentation

## 2020-10-28 DIAGNOSIS — J449 Chronic obstructive pulmonary disease, unspecified: Secondary | ICD-10-CM | POA: Diagnosis not present

## 2020-10-28 DIAGNOSIS — I5022 Chronic systolic (congestive) heart failure: Secondary | ICD-10-CM

## 2020-10-28 DIAGNOSIS — I484 Atypical atrial flutter: Secondary | ICD-10-CM | POA: Insufficient documentation

## 2020-10-28 DIAGNOSIS — E1151 Type 2 diabetes mellitus with diabetic peripheral angiopathy without gangrene: Secondary | ICD-10-CM | POA: Insufficient documentation

## 2020-10-28 DIAGNOSIS — Z87891 Personal history of nicotine dependence: Secondary | ICD-10-CM | POA: Diagnosis not present

## 2020-10-28 DIAGNOSIS — I110 Hypertensive heart disease with heart failure: Secondary | ICD-10-CM | POA: Diagnosis not present

## 2020-10-28 DIAGNOSIS — Z79899 Other long term (current) drug therapy: Secondary | ICD-10-CM | POA: Insufficient documentation

## 2020-10-28 DIAGNOSIS — Z882 Allergy status to sulfonamides status: Secondary | ICD-10-CM | POA: Insufficient documentation

## 2020-10-28 DIAGNOSIS — I2510 Atherosclerotic heart disease of native coronary artery without angina pectoris: Secondary | ICD-10-CM | POA: Diagnosis not present

## 2020-10-28 DIAGNOSIS — Z8249 Family history of ischemic heart disease and other diseases of the circulatory system: Secondary | ICD-10-CM | POA: Diagnosis not present

## 2020-10-28 DIAGNOSIS — Z9989 Dependence on other enabling machines and devices: Secondary | ICD-10-CM | POA: Diagnosis not present

## 2020-10-28 DIAGNOSIS — G4733 Obstructive sleep apnea (adult) (pediatric): Secondary | ICD-10-CM | POA: Diagnosis not present

## 2020-10-28 LAB — T4, FREE: Free T4: 1.25 ng/dL — ABNORMAL HIGH (ref 0.61–1.12)

## 2020-10-28 LAB — COMPREHENSIVE METABOLIC PANEL
ALT: 19 U/L (ref 0–44)
AST: 27 U/L (ref 15–41)
Albumin: 3 g/dL — ABNORMAL LOW (ref 3.5–5.0)
Alkaline Phosphatase: 31 U/L — ABNORMAL LOW (ref 38–126)
Anion gap: 12 (ref 5–15)
BUN: 18 mg/dL (ref 8–23)
CO2: 27 mmol/L (ref 22–32)
Calcium: 8.5 mg/dL — ABNORMAL LOW (ref 8.9–10.3)
Chloride: 95 mmol/L — ABNORMAL LOW (ref 98–111)
Creatinine, Ser: 1.21 mg/dL — ABNORMAL HIGH (ref 0.44–1.00)
GFR, Estimated: 46 mL/min — ABNORMAL LOW (ref >60)
Glucose, Bld: 117 mg/dL — ABNORMAL HIGH (ref 70–99)
Potassium: 4.2 mmol/L (ref 3.5–5.1)
Sodium: 134 mmol/L — ABNORMAL LOW (ref 135–145)
Total Bilirubin: 0.6 mg/dL (ref 0.3–1.2)
Total Protein: 7.1 g/dL (ref 6.5–8.1)

## 2020-10-28 LAB — CBC
HCT: 43.6 % (ref 36.0–46.0)
Hemoglobin: 14.3 g/dL (ref 12.0–15.0)
MCH: 30.7 pg (ref 26.0–34.0)
MCHC: 32.8 g/dL (ref 30.0–36.0)
MCV: 93.6 fL (ref 80.0–100.0)
Platelets: 400 10*3/uL (ref 150–400)
RBC: 4.66 MIL/uL (ref 3.87–5.11)
RDW: 14.6 % (ref 11.5–15.5)
WBC: 7.8 10*3/uL (ref 4.0–10.5)
nRBC: 0 % (ref 0.0–0.2)

## 2020-10-28 LAB — TSH: TSH: 6.042 u[IU]/mL — ABNORMAL HIGH (ref 0.350–4.500)

## 2020-10-28 NOTE — Progress Notes (Signed)
Advanced Heart Failure Clinic Note   PCP:  Ann Held, DO  HF Cardiologist: Dr. [name]  History of Present Illness: Heidi Stark is a 80 y.o. female who has history of HTN, DM, hyperlipidemia.  She was admitted in 4/15 with splenic and renal infarcts.  She was found to have paroxysmal atrial fibrillation.  In 4/15, she had fever and flank pain.  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  TEE showed prominent aortic plaque.  While she was in the hospital, she was noted to have a brief run of atrial fibrillation and was started on coumadin.    She has left leg pain with ambulation after about 100 feet, LE dopplers 5/15 showed occluded left external iliac artery.  This has been present ever since she had left iliac damage with a prior back surgery.    She was stable until 1/18, when she developed palpitations and exertional dyspnea.  She was admitted to [facility] with atrial fibrillation/RVR and acute systolic CHF.  EF was found to be 30-35%, down from 50-55% in 2015.  She was diuresed and underwent DCCV back to NSR.  She subsequently had atrial fibrillation ablation in 4/18.  She went back into atrial fibrillation in 5/19 and had DCCV back today NSR.   Echo was done 6/19 and reviewed, EF 55%, mild LVH, moderate diastolic dysfunction, normal RV size and systolic function.   With recurrent atrial fibrillation, she was admitted in 10/19 to start Tikosyn.  She was cardioverted back to NSR.   She had a recent admission in 2/22 with lightheadedness/presyncope that appeared to correlate with runs of VT.  She was on Tikosyn for atrial fibrillation.  Tikosyn was stopped and amiodarone was started.  cMRI showed EF 40% with subendocardial scar in the basal to mid inferolateral wall, RV EF was 38%.  She had coronary angiography showing no obstructive disease.  VT subsided and she was sent home on amiodarone.   09/13/20, she had 3 episodes of presyncope  in succession while sitting at a desk.  No prodrome, each episode of severe lightheadedness lasted maybe 30 seconds.  She did not pass out. Because of these events, she came to the ER. She was admitted for observation and amiodarone gtt was restarted.  She had no VT overnight.  She remained in atypical atrial flutter today (was in atypical flutter at last admission). She had a single chamber Medtronic ICD placed 09/15/20. Discharge weight 256 lbs.  She had post hospital f/u on 09/22/20 and was noted to be back in Atrial flutter w/ CVR. Volume status was ok and she endorsed stable NYHA Class II-III symptoms. She was set up for repeat DCCV 3 weeks later.   She underwent DCCV on 3/28 and converted back to NSR after 1 shock at 200J.   She returns to clinic today for f/u. She is in NSR, 60 bpm, w/ 1st degree AVB.  She has tapered down her amiodarone, now on 200 mg once daily. Reports full compliance w/ Eliquis. No abnormal bleeding. Wt is stable at 250 lb. BP is soft 94/60 but she reports this is her baseline. She denies dizziness, no syncope/ near syncope. Also denies palpitations, dyspnea and CP. She does report chronic fatigue but not any worse than usual.     Labs (4/15): K 4.1, creatinine 0.7, HCT 46.2 Labs (7/15): K 3.9, creatinine 0.8, HCT 50 Labs (12/15): K 5, creatinine 0.7, HDL 33, LDL  57 Labs (5/16): K 4, creatinine 0.84, TGs 256, HDL 29, LDL 42, HCT 41.2 Labs (1/18): K 3.4, creatinine 0.78 Labs (2/18): K 4.4, creatinine 0.8, free T3/T4 normal, mildly elevated TSH, LFTs normal, hgb 15.6 Labs (4/18): K 4.2, creatinine 0.86, TSH elevated, free T3 and free T4 normal Labs (5/19): K 3.9, creatinine 0.78 Labs (6/19): K 4.4, creatinine 0.88 Labs (12/19): K 4.2, creatinine 0.85, TGs 322, LDL 77 Labs (2/20): LDL 46, TGs 362 Labs (3/20): K 4.7, creatinine 1.07 Labs (11/20): TGs 282, LDL 92 Labs (3/22): K 4.1, creatinine 1.23   ECG (personally reviewed): NSR 60 bpm w/ 1st degree AVB  PMH: 1.Type  II diabetes 2. HTN 3. Hyperlipidemia 4. COPD: has quit smoking.  5. CAD: Coronary CTA (4/18) with nonobstructive coronary disease, calcium score 335 Agatston units (82nd percentile).  - Coronary angiography (2/22) with no obstructive disease/ 6. Paroxysmal atrial fibrillation: First noted in 4/15.  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   - Event monitor (4/15) with no atrial fibrillation.  - Admission with atrial fibrillation/RVR in 1/18.  - Atrial fibrillation ablation in 4/18.  - DCCV in 5/19.  - Tikosyn started + DCCV in 10/19.  - S/p single chamber MDT ICD 09/15/20 7. Chronic systolic CHF: ?tachycardia-mediated as first noted in setting of atrial fibrillation with RVR.  - TEE (4/15) with EF 50-55%, mild LVH, grade III-IV plaque in descending thoracic aorta, RV normal, no PFO, peak RV-RA gradient 42 mmHg.   - Echo (1/18) with EF 30-35%, diffuse hypokinesis, moderate LVH, mildly dilated RV, PASP 55 mmHg.  - Echo (4/18) with EF 50%, anterolateral/inferolateral mild hypokinesis, mild aortic stenosis, IVC normal.  - Echo (6/19) with EF 55%, mild LVH, moderate diastolic dysfunction, normal RV size and systolic function.  -  cMRI (2/22) with EF 40% with subendocardial scar in the basal to mid inferolateral wall, RV EF was 38%. 7. H/o diskectomy. 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 Lower extremity arterial dopplers (5/15) with occluded left external iliac artery.  - ABIs (5/18): 1.16 (normal) on right, 0.56 on left.  9. Aortic stenosis: Mild on 4/18 echo.  10. OSA: Uses CPAP  Current Outpatient Medications  Medication Sig Dispense Refill  . acetaminophen (TYLENOL) 500 MG tablet Take 500 mg by mouth every 6 (six) hours as needed for moderate pain or headache.    Marland Kitchen amiodarone (PACERONE) 200 MG tablet START WITH 2 TABS TWICE DAILY. ON 3/17, START 2 TABS ONCE DAILY X2 WEEKS. ON 3/31, START 1 TAB DAILY 84 tablet 2  . apixaban  (ELIQUIS) 5 MG TABS tablet Take 1 tablet (5 mg total) by mouth 2 (two) times daily. 60 tablet 11  . Dapagliflozin-metFORMIN HCl ER (XIGDUO XR) 11-998 MG TB24 Take 1 tablet by mouth in the morning and at bedtime.    Marland Kitchen eplerenone (INSPRA) 25 MG tablet Take 1 tablet (25 mg total) by mouth daily. 30 tablet 5  . fenofibrate (TRICOR) 145 MG tablet Take 145 mg by mouth at bedtime.    . furosemide (LASIX) 40 MG tablet Take 1.5 tablets (60 mg total) by mouth 2 (two) times daily. 270 tablet 3  . icosapent Ethyl (VASCEPA) 1 g capsule Take 2 g by mouth 2 (two) times daily.    . insulin glargine (LANTUS) 100 UNIT/ML injection Inject 15 Units into the skin at bedtime.     Marland Kitchen LIVALO 4 MG TABS Take 1 tablet by mouth daily.    Marland Kitchen  metoprolol succinate (TOPROL-XL) 25 MG 24 hr tablet Take 1 tablet (25 mg total) by mouth at bedtime. 30 tablet 2  . Multiple Vitamin (MULTIVITAMIN WITH MINERALS) TABS tablet Take 1 tablet by mouth daily.    . potassium chloride (KLOR-CON M10) 10 MEQ tablet Take 2 tablets (20 mEq total) by mouth 2 (two) times daily. 120 tablet 11  . sacubitril-valsartan (ENTRESTO) 24-26 MG Take 1 tablet by mouth 2 (two) times daily. 60 tablet 2   No current facility-administered medications for this encounter.    Allergies:   Neomycin-bacitracin zn-polymyx, Sulfonamide derivatives, Ciprofloxacin, and Spironolactone   Social History:  The patient  reports that she has quit smoking. Her smoking use included cigarettes. She has a 50.00 pack-year smoking history. She has never used smokeless tobacco. She reports that she does not drink alcohol and does not use drugs.   Family History:  The patient's family history includes Cancer in her mother; Diabetes in her father, mother, and another family member; Factor V Leiden deficiency in her daughter; Hypertension in her mother; Melanoma in an other family member; Obesity in her father and mother.   ROS:  Please see the history of present illness.   All other  systems are personally reviewed and negative.   Exam:   Pulse (!) 58   Wt 114.8 kg   SpO2 93%   BMI 40.84 kg/m   PHYSICAL EXAM: General:  Well appearing elderly WF in wheel chair, moderately obese. No respiratory difficulty HEENT: normal Neck: supple. no JVD. Carotids 2+ bilat; no bruits. No lymphadenopathy or thyromegaly appreciated. Cor: PMI nondisplaced. Regular rate & rhythm. No rubs, gallops or murmurs. Lungs: clear. No wheezing  Abdomen: soft, nontender, nondistended. No hepatosplenomegaly. No bruits or masses. Good bowel sounds. Extremities: no cyanosis, clubbing, rash, edema Neuro: alert & oriented x 3, cranial nerves grossly intact. moves all 4 extremities w/o difficulty. Affect pleasant.    Recent Labs: 09/07/2020: ALT 22; B Natriuretic Peptide 85.9; TSH 3.251 09/14/2020: Magnesium 2.2 09/22/2020: Platelets 255 10/11/2020: BUN 28; Creatinine, Ser 1.40; Hemoglobin 16.3; Potassium 4.3; Sodium 137  Personally reviewed   Wt Readings from Last 3 Encounters:  10/28/20 114.8 kg  09/22/20 112.5 kg  09/15/20 116.2 kg   ASSESSMENT AND PLAN:  1. H/o VT: noted 08/2020. 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 Had been on Tikosyn but this wasnot the typical Tikosyn-induced polymorphic VT and she had had no changes in Tikosyn dosingprior to occurrence.cMRI showedsubendocardial LGE in the basal to mid inferolateral wall. This appearedto be a coronary disease patternhowevercoronary angiogramdid not show significant coronary disease. She was transitioned from Tikosyn to amiodarone and underwent Medtronic single chamber ICD on 09/15/20. No further VT on recent device interrogation. She has completed amiodarone load, and now on maintenance dose of 200 mg daily. Denies any further presyncopal spells.  No palpitations.  - continue amiodarone 200 mg daily w/ biannual monitoring for TFTs and HFTs. Needs yearly eye exams  - QTc stable on EKG today. Check BMP  2. Atrial  fibrillation/flutter: Paroxysmal. 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 She was admitted in 1/18 with atrial fibrillation/RVR and CHF. She was diuresed and had DCCV back to NSR. She had atrial fibrillation ablation in 4/18. In 5/19, she went into atrial fibrillation transiently. In 10/19, she was back in atrial fibrillation and was admitted for Tikosyn initiation and cardioversion.Atypical atrial flutter noted in 12/21 with DCCV to NSR. As outlined above, now off Tikosyn and on amiodarone. Reverted back to  rate controlled atypical atrial flutter at post hospital f/u 09/30/20. Underwent repeat DCCV on 3/28. Maintaining NSR on EKG today. HR well controlled in the low 60s -Continue amiodarone 200 mg daily. Check HFTs and TFTs today. Needs yearly eye exams   - Continue Eliquis 5 mg bid. Denies abnormal bleeding Check CBC today  3. Claudication: Left leg claudication with absent left PT pulse. 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  4. Chronic systolic =>diastolic CHF: EF 25-95% on echo in 1/18, down from 50-55% in 4/15. 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 ChronicNYHA class II-III symptoms (functional class complicated by heel pain, body habitus and deconditioning). Volume status stable today. - Continue lasix 60 mg bid. - Continue Toprol XL 25 mgdaily. -ContinueEntresto 24/26 mg bid. BP too soft for titration  - Continue eplerenone 25 mg daily.  - Continue Farxiga 10 mg daily.  - Check BMP today  5. CAD: Coronary CTAin 2018showed coronary calcium score in the 82nd percentile with nonobstructive coronary disease. Cathin 2/22showed only luminal irregularities. She denies anginal symptoms  - not on ASA due to  Eliquis.  - ContinueVascepa and Livalo.  6. COPD:History of smoking. Recently quit  7. OSA: reports full nightly compliance w/ CPAP.  F/u w/ Dr. [name] in 2-3 months   Signed, Lyda Jester, PA-C-BC  10/28/2020  [address] [place] [id number] [phone] (office) [phone] (fax)

## 2020-10-28 NOTE — Patient Instructions (Signed)
It was great to see you today! No medication changes are needed at this time.  Labs today We will only contact you if something comes back abnormal or we need to make some changes. Otherwise no news is good news!  Your physician recommends that you schedule a follow-up appointment in: 3 months with Dr [name]  Do the following things EVERYDAY: 1) Weigh yourself in the morning before breakfast. Write it down and keep it in a log. 2) Take your medicines as prescribed 3) Eat low salt foods--Limit salt (sodium) to 2000 mg per day.  4) Stay as active as you can everyday 5) Limit all fluids for the day to less than 2 liters At the [facility], you and your health needs are our priority. As part of our continuing mission to provide you with exceptional heart care, we have created designated Provider Care Teams. These Care Teams include your primary Cardiologist (physician) and Advanced Practice Providers (APPs- Physician Assistants and Nurse Practitioners) who all work together to provide you with the care you need, when you need it.   You may see any of the following providers on your designated Care Team at your next follow up: Marland Kitchen Dr Glori Bickers . Dr [name] . Dr Vickki Muff . Darrick Grinder, NP . Lyda Jester, [place] . Audry Riles, PharmD   Please be sure to bring in all your medications bottles to every appointment.   If you have any questions or concerns before your next appointment please send [name] a message through [place] or call our office at [phone].    TO LEAVE A MESSAGE FOR THE NURSE SELECT OPTION 2, PLEASE LEAVE A MESSAGE INCLUDING: . YOUR NAME . DATE OF BIRTH . CALL BACK NUMBER . REASON FOR CALL**this is important as we prioritize the call backs  YOU WILL RECEIVE A CALL BACK THE SAME DAY AS LONG AS YOU CALL BEFORE 4:00 PM  Please see our updated No Show and Same Day Appointment Cancellation Policy attached to your  AVS.

## 2020-10-29 LAB — T3, FREE: T3, Free: 2.3 pg/mL (ref 2.0–4.4)

## 2020-11-19 ENCOUNTER — Telehealth (HOSPITAL_COMMUNITY): Payer: Self-pay | Admitting: *Deleted

## 2020-11-19 NOTE — Telephone Encounter (Signed)
Pt left vm requesting a call back about how to take metoprolol. I called pt back no answer/no vm set up

## 2020-11-24 ENCOUNTER — Other Ambulatory Visit: Payer: Self-pay | Admitting: Cardiology

## 2020-11-24 ENCOUNTER — Other Ambulatory Visit (HOSPITAL_COMMUNITY): Payer: Self-pay | Admitting: Cardiology

## 2020-11-24 ENCOUNTER — Other Ambulatory Visit: Payer: Self-pay | Admitting: Student

## 2020-11-25 NOTE — Telephone Encounter (Signed)
This is a CHF pt, Dr. Mclean 

## 2020-12-01 ENCOUNTER — Other Ambulatory Visit (HOSPITAL_COMMUNITY): Payer: Self-pay | Admitting: Cardiology

## 2020-12-01 ENCOUNTER — Telehealth (HOSPITAL_COMMUNITY): Payer: Self-pay | Admitting: Cardiology

## 2020-12-01 ENCOUNTER — Encounter (HOSPITAL_COMMUNITY): Payer: Self-pay

## 2020-12-01 NOTE — Telephone Encounter (Signed)
Pt returned call and reports her HR is erratic and reports she can feel in her L lower chamber. Reports HR changed over the past two weeks or so  does not have pulse ox and or b/p  Monitor- pt becomes very symptomatic   Will have device clinic get remote transmission   Would you like have return for ekg and or adjust medications?

## 2020-12-01 NOTE — Telephone Encounter (Signed)
Needs ECG or interrogation of device to tell us if she is in atrial fibrillation.  May be easiest to have her come for ECG tomorrow.

## 2020-12-02 ENCOUNTER — Other Ambulatory Visit: Payer: Self-pay

## 2020-12-02 ENCOUNTER — Ambulatory Visit (HOSPITAL_COMMUNITY)
Admission: RE | Admit: 2020-12-02 | Discharge: 2020-12-02 | Disposition: A | Discharge status: Home / Self Care | Payer: Medicare Other | Source: Ambulatory Visit | Attending: Cardiology | Admitting: Cardiology

## 2020-12-02 DIAGNOSIS — I4891 Unspecified atrial fibrillation: Secondary | ICD-10-CM | POA: Diagnosis present

## 2020-12-02 MED ORDER — FUROSEMIDE 40 MG PO TABS
80.0000 mg | ORAL_TABLET | Freq: Two times a day (BID) | ORAL | 3 refills | Status: DC
Start: 2020-12-02 — End: 2021-10-13

## 2020-12-02 NOTE — Telephone Encounter (Signed)
Pt returned my call and nurse visit scheduled for 2pm today.

## 2020-12-02 NOTE — Telephone Encounter (Signed)
Pt sent mychart message at 10:13 am 5/18 after leaving VM on triage line. I saw her mychart message before I was able to check her VM  and forwarded to Dr.McLean at 10:59am. Provider had not responded to message at 4:40pm. Pt called and spoke directly to [name]. [name] sent Dr.McLean a message at 4:45pm he responded at 5:18pm (after clinic closed).  Pt sent another mychart message at 9:47pm stating she had not heard back from our office Dr.McLean replied to pt at 9:48pm (please see below). I called pt this morning to arrange ekg visit pt did not answer and no vm set up. mychart message sent to patient to contact office to schedule ekg.  Larey Dresser, MD  [name] M 11 hours ago (9:53 PM)      Nurse should have called you about coming by the office for an ECG.  I suspect she will call in the morning.  In the mean time, you can increase amiodarone to 400 mg daily until the palpitations subside.  Will need to have you come by office tomorrow to get ECG to see if you are in atrial fibrillation.  You may end up needing a cardioversion again.    This MyChart message has not been read.   [name], MD 12 hours ago (9:47 PM)      Have not gotten any response to my voice message from the nurse.  Would like to know how to handle what I believe is the heart beat out of rhythm. (The lower chamber). Certainly hope I am wrong. My question was should I go back to 400mg . Thank you for your help.  Heidi Stark   1941/03/19.     You routed conversation to Larey Dresser, MD 22 hours ago (10:59 AM)   [name], MD 23 hours ago (10:13 AM)      I have some of the same feeling I had before going to the hospital.  Would you want me to go back to taking 400 MG of the Amiodarone to see if the feeling subsides?  You can reach me at [phone].   Heidi Stark, birthdate 13-Feb-1941

## 2020-12-03 ENCOUNTER — Encounter (HOSPITAL_COMMUNITY): Payer: Self-pay | Admitting: *Deleted

## 2020-12-06 ENCOUNTER — Other Ambulatory Visit: Payer: Self-pay | Admitting: Student

## 2020-12-06 NOTE — Telephone Encounter (Signed)
This is a CHF pt 

## 2020-12-09 ENCOUNTER — Other Ambulatory Visit (HOSPITAL_COMMUNITY): Payer: Self-pay

## 2020-12-09 ENCOUNTER — Other Ambulatory Visit: Payer: Self-pay

## 2020-12-09 ENCOUNTER — Ambulatory Visit (HOSPITAL_COMMUNITY)
Admission: RE | Admit: 2020-12-09 | Discharge: 2020-12-09 | Disposition: A | Discharge status: Home / Self Care | Payer: Medicare Other | Source: Ambulatory Visit | Attending: Cardiology | Admitting: Cardiology

## 2020-12-09 ENCOUNTER — Encounter (HOSPITAL_COMMUNITY): Payer: Self-pay | Admitting: Cardiology

## 2020-12-09 VITALS — BP 118/64 | HR 57 | Wt 265.5 lb

## 2020-12-09 DIAGNOSIS — Z95810 Presence of automatic (implantable) cardiac defibrillator: Secondary | ICD-10-CM | POA: Diagnosis not present

## 2020-12-09 DIAGNOSIS — Z7901 Long term (current) use of anticoagulants: Secondary | ICD-10-CM | POA: Insufficient documentation

## 2020-12-09 DIAGNOSIS — I5022 Chronic systolic (congestive) heart failure: Secondary | ICD-10-CM | POA: Diagnosis not present

## 2020-12-09 DIAGNOSIS — I480 Paroxysmal atrial fibrillation: Secondary | ICD-10-CM | POA: Diagnosis not present

## 2020-12-09 DIAGNOSIS — I5042 Chronic combined systolic (congestive) and diastolic (congestive) heart failure: Secondary | ICD-10-CM | POA: Diagnosis present

## 2020-12-09 DIAGNOSIS — G4733 Obstructive sleep apnea (adult) (pediatric): Secondary | ICD-10-CM | POA: Insufficient documentation

## 2020-12-09 DIAGNOSIS — I484 Atypical atrial flutter: Secondary | ICD-10-CM | POA: Insufficient documentation

## 2020-12-09 DIAGNOSIS — J449 Chronic obstructive pulmonary disease, unspecified: Secondary | ICD-10-CM | POA: Insufficient documentation

## 2020-12-09 DIAGNOSIS — E1151 Type 2 diabetes mellitus with diabetic peripheral angiopathy without gangrene: Secondary | ICD-10-CM | POA: Insufficient documentation

## 2020-12-09 DIAGNOSIS — Z8249 Family history of ischemic heart disease and other diseases of the circulatory system: Secondary | ICD-10-CM | POA: Diagnosis not present

## 2020-12-09 DIAGNOSIS — I2510 Atherosclerotic heart disease of native coronary artery without angina pectoris: Secondary | ICD-10-CM | POA: Insufficient documentation

## 2020-12-09 DIAGNOSIS — I110 Hypertensive heart disease with heart failure: Secondary | ICD-10-CM | POA: Diagnosis not present

## 2020-12-09 DIAGNOSIS — Z79899 Other long term (current) drug therapy: Secondary | ICD-10-CM | POA: Diagnosis not present

## 2020-12-09 DIAGNOSIS — Z7984 Long term (current) use of oral hypoglycemic drugs: Secondary | ICD-10-CM | POA: Insufficient documentation

## 2020-12-09 DIAGNOSIS — Z794 Long term (current) use of insulin: Secondary | ICD-10-CM | POA: Diagnosis not present

## 2020-12-09 DIAGNOSIS — M79605 Pain in left leg: Secondary | ICD-10-CM | POA: Insufficient documentation

## 2020-12-09 DIAGNOSIS — I472 Ventricular tachycardia, unspecified: Secondary | ICD-10-CM

## 2020-12-09 DIAGNOSIS — Z87891 Personal history of nicotine dependence: Secondary | ICD-10-CM | POA: Diagnosis not present

## 2020-12-09 LAB — COMPREHENSIVE METABOLIC PANEL
ALT: 25 U/L (ref 0–44)
AST: 40 U/L (ref 15–41)
Albumin: 3.5 g/dL (ref 3.5–5.0)
Alkaline Phosphatase: 43 U/L (ref 38–126)
Anion gap: 9 (ref 5–15)
BUN: 21 mg/dL (ref 8–23)
CO2: 33 mmol/L — ABNORMAL HIGH (ref 22–32)
Calcium: 9.1 mg/dL (ref 8.9–10.3)
Chloride: 96 mmol/L — ABNORMAL LOW (ref 98–111)
Creatinine, Ser: 1.2 mg/dL — ABNORMAL HIGH (ref 0.44–1.00)
GFR, Estimated: 46 mL/min — ABNORMAL LOW (ref >60)
Glucose, Bld: 237 mg/dL — ABNORMAL HIGH (ref 70–99)
Potassium: 4.6 mmol/L (ref 3.5–5.1)
Sodium: 138 mmol/L (ref 135–145)
Total Bilirubin: 0.9 mg/dL (ref 0.3–1.2)
Total Protein: 6.9 g/dL (ref 6.5–8.1)

## 2020-12-09 LAB — BRAIN NATRIURETIC PEPTIDE: B Natriuretic Peptide: 106.4 pg/mL — ABNORMAL HIGH (ref 0.0–100.0)

## 2020-12-09 LAB — T4, FREE: Free T4: 1.02 ng/dL (ref 0.61–1.12)

## 2020-12-09 LAB — TSH: TSH: 3.927 u[IU]/mL (ref 0.350–4.500)

## 2020-12-09 MED ORDER — ENTRESTO 49-51 MG PO TABS: 1.0000 | ORAL_TABLET | Freq: Two times a day (BID) | ORAL | 3 refills | Status: DC

## 2020-12-09 MED ORDER — DAPAGLIFLOZIN PROPANEDIOL 10 MG PO TABS
10.0000 mg | ORAL_TABLET | Freq: Every day | ORAL | 3 refills | Status: DC
Start: 2020-12-09 — End: 2021-03-09

## 2020-12-09 NOTE — Progress Notes (Signed)
Advanced Heart Failure Clinic Note   PCP:  Ann Held, DO  HF Cardiologist: Dr. [name]  History of Present Illness: Heidi Stark is a 80 y.o. female who has history of HTN, DM, hyperlipidemia.  She was admitted in 4/15 with splenic and renal infarcts.  She was found to have paroxysmal atrial fibrillation.  In 4/15, she had fever and flank pain.  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  TEE showed prominent aortic plaque.  While she was in the hospital, she was noted to have a brief run of atrial fibrillation and was started on coumadin.    She has left leg pain with ambulation after about 100 feet, LE dopplers 5/15 showed occluded left external iliac artery.  This has been present ever since she had left iliac damage with a prior back surgery.    She was stable until 1/18, when she developed palpitations and exertional dyspnea.  She was admitted to [facility] with atrial fibrillation/RVR and acute systolic CHF.  EF was found to be 30-35%, down from 50-55% in 2015.  She was diuresed and underwent DCCV back to NSR.  She subsequently had atrial fibrillation ablation in 4/18.  She went back into atrial fibrillation in 5/19 and had DCCV back today NSR.   Echo was done 6/19 and reviewed, EF 55%, mild LVH, moderate diastolic dysfunction, normal RV size and systolic function.   With recurrent atrial fibrillation, she was admitted in 10/19 to start Tikosyn.  She was cardioverted back to NSR.   She had a recent admission in 2/22 with lightheadedness/presyncope that appeared to correlate with runs of VT.  She was on Tikosyn for atrial fibrillation though VT not thought to be due to Tikosyn (not polymorphic, QT not prolonged).  Tikosyn was stopped and amiodarone was started.  Ecoh in 2/22 showed EF 50-55% but cMRI (2/22) showed EF 40% with subendocardial scar in the basal to mid inferolateral wall, RV EF was 38%.  She had coronary angiography showing  no obstructive disease.  VT subsided and she was sent home on amiodarone.   09/13/20, she had 3 episodes of presyncope in succession while sitting at a desk.  No prodrome, each episode of severe lightheadedness lasted maybe 30 seconds.  She did not pass out. Because of these events, she came to the ER. She was admitted for observation and amiodarone gtt was restarted.  She had no VT overnight.  She remained in atypical atrial flutter today (was in atypical flutter at last admission). She had a single chamber Medtronic ICD placed 09/15/20. Discharge weight 256 lbs.  She had post hospital f/u on 09/22/20 and was noted to be back in atrial flutter w/ CVR. Volume status was ok and she endorsed stable NYHA Class II-III symptoms. She was set up for repeat DCCV 3 weeks later.   She underwent DCCV on 10/11/20 and converted back to NSR after 1 shock at 200J.   She returns for followup of CHF and atrial fibrillation.  She is in NSR, no recent AF on device interrogation.  She has quit smoking since 2/22.  Since quitting tobacco, she has gained considerable weight.  Weight is up 12 lbs today.  She continues to be limited by pain and instability in her left leg and does not walk much.  No dyspnea walking around the house.  No orthopnea/PND.  No lightheadedness.  No chest pain.  Lasix recently increased to 80 mg  bid with weight gain.   REDS clip 28%  MDT device interrogation: Recent decreased thoracic impedance but now trending back up. No AF.   Labs (4/15): K 4.1, creatinine 0.7, HCT 46.2 Labs (7/15): K 3.9, creatinine 0.8, HCT 50 Labs (12/15): K 5, creatinine 0.7, HDL 33, LDL 57 Labs (5/16): K 4, creatinine 0.84, TGs 256, HDL 29, LDL 42, HCT 41.2 Labs (1/18): K 3.4, creatinine 0.78 Labs (2/18): K 4.4, creatinine 0.8, free T3/T4 normal, mildly elevated TSH, LFTs normal, hgb 15.6 Labs (4/18): K 4.2, creatinine 0.86, TSH elevated, free T3 and free T4 normal Labs (5/19): K 3.9, creatinine 0.78 Labs (6/19): K 4.4,  creatinine 0.88 Labs (12/19): K 4.2, creatinine 0.85, TGs 322, LDL 77 Labs (2/20): LDL 46, TGs 362 Labs (3/20): K 4.7, creatinine 1.07 Labs (11/20): TGs 282, LDL 92 Labs (3/22): K 4.1, creatinine 1.23 Labs (4/22): K 4.2, creatinine 1.2, LFTs normal, TSH mildly elevated, free T4 mildly elevated, normal T3.   ECG (personally reviewed): NSR, 1st degree AVB   PMH: 1.Type II diabetes 2. HTN 3. Hyperlipidemia 4. COPD: has quit smoking.  5. CAD: Coronary CTA (4/18) with nonobstructive coronary disease, calcium score 335 Agatston units (82nd percentile).  - Coronary angiography (2/22) with no obstructive disease 6. Paroxysmal atrial fibrillation: First noted in 4/15.  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   - Event monitor (4/15) with no atrial fibrillation.  - Admission with atrial fibrillation/RVR in 1/18.  - Atrial fibrillation ablation in 4/18.  - DCCV in 5/19.  - Tikosyn started + DCCV in 10/19. VT => Tikosyn stopped and amiodarone begun in 3/22.   - S/p single chamber MDT ICD 09/15/20 - DCCV to NSR in 3/22.  7. Chronic systolic CHF: ?tachycardia-mediated as first noted in setting of atrial fibrillation with RVR.  - TEE (4/15) with EF 50-55%, mild LVH, grade III-IV plaque in descending thoracic aorta, RV normal, no PFO, peak RV-RA gradient 42 mmHg.   - Echo (1/18) with EF 30-35%, diffuse hypokinesis, moderate LVH, mildly dilated RV, PASP 55 mmHg.  - Echo (4/18) with EF 50%, anterolateral/inferolateral mild hypokinesis, mild aortic stenosis, IVC normal.  - Echo (6/19) with EF 55%, mild LVH, moderate diastolic dysfunction, normal RV size and systolic function.  - cMRI (2/22) with EF 40% with subendocardial scar in the basal to mid inferolateral wall, RV EF was 38%. - Echo (2/22) with EF 50-55%, normal RV.  7. H/o diskectomy. 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 Lower extremity arterial dopplers (5/15) with occluded left  external iliac artery.  - ABIs (5/18): 1.16 (normal) on right, 0.56 on left.  9. Aortic stenosis: Mild on 4/18 echo.  10. OSA: Uses CPAP 11. VT: 2/22.  No significant CAD on cath in 2/22.  cMRI with EF 40% with subendocardial scar in the basal to mid inferolateral wall, RV EF was 38%. - Medtronic ICD placed 3/22.   Current Outpatient Medications  Medication Sig Dispense Refill  . acetaminophen (TYLENOL) 500 MG tablet Take 500 mg by mouth every 6 (six) hours as needed for moderate pain or headache.    Marland Kitchen amiodarone (PACERONE) 200 MG tablet Take 200 mg by mouth daily.    Marland Kitchen apixaban (ELIQUIS) 5 MG TABS tablet Take 1 tablet (5 mg total) by mouth 2 (two) times daily. 60 tablet 11  . Dapagliflozin-metFORMIN HCl ER (XIGDUO XR) 11-998 MG TB24 Take 1 tablet by mouth in the morning and at bedtime.    Marland Kitchen  eplerenone (INSPRA) 25 MG tablet Take 1 tablet (25 mg total) by mouth daily. 30 tablet 5  . fenofibrate (TRICOR) 145 MG tablet TAKE 1 TABLET BY MOUTH EVERY DAY 90 tablet 3  . furosemide (LASIX) 40 MG tablet Take 2 tablets (80 mg total) by mouth 2 (two) times daily. 270 tablet 3  . icosapent Ethyl (VASCEPA) 1 g capsule Take 2 g by mouth 2 (two) times daily.    . insulin glargine (LANTUS) 100 UNIT/ML injection Inject 15 Units into the skin at bedtime.     Marland Kitchen LIVALO 4 MG TABS Take 1 tablet by mouth daily.    . metoprolol succinate (TOPROL-XL) 25 MG 24 hr tablet TAKE 1 TABLET BY MOUTH EVERYDAY AT BEDTIME 90 tablet 3  . Multiple Vitamin (MULTIVITAMIN WITH MINERALS) TABS tablet Take 1 tablet by mouth daily.    . potassium chloride (KLOR-CON M10) 10 MEQ tablet Take 2 tablets (20 mEq total) by mouth 2 (two) times daily. 120 tablet 11  . sacubitril-valsartan (ENTRESTO) 49-51 MG Take 1 tablet by mouth 2 (two) times daily. 60 tablet 3   No current facility-administered medications for this encounter.    Allergies:   Neomycin-bacitracin zn-polymyx, Sulfonamide derivatives, Ciprofloxacin, and Spironolactone    Social History:  The patient  reports that she has quit smoking. Her smoking use included cigarettes. She has a 50.00 pack-year smoking history. She has never used smokeless tobacco. She reports that she does not drink alcohol and does not use drugs.   Family History:  The patient's family history includes Cancer in her mother; Diabetes in her father, mother, and another family member; Factor V Leiden deficiency in her daughter; Hypertension in her mother; Melanoma in an other family member; Obesity in her father and mother.   ROS:  Please see the history of present illness.   All other systems are personally reviewed and negative.   Exam:   BP 118/64   Pulse (!) 57   Wt 120.4 kg (265 lb 8 oz)   SpO2 96%   BMI 42.85 kg/m   PHYSICAL EXAM: General: NAD, obese Neck: No JVD, no thyromegaly or thyroid nodule.  Lungs: Clear to auscultation bilaterally with normal respiratory effort. CV: Nondisplaced PMI.  Heart regular S1/S2, no S3/S4, no murmur.  Trace ankle edema.  No carotid bruit.  Unable to palpate left pedal pulses.  Abdomen: Soft, nontender, no hepatosplenomegaly, no distention.  Skin: Intact without lesions or rashes.  Neurologic: Alert and oriented x 3.  Psych: Normal affect. Extremities: No clubbing or cyanosis.  HEENT: Normal.   Recent Labs: 09/07/2020: B Natriuretic Peptide 85.9 09/14/2020: Magnesium 2.2 10/28/2020: ALT 19; BUN 18; Creatinine, Ser 1.21; Hemoglobin 14.3; Platelets 400; Potassium 4.2; Sodium 134; TSH 6.042  Personally reviewed   Wt Readings from Last 3 Encounters:  12/09/20 120.4 kg (265 lb 8 oz)  10/28/20 114.8 kg (253 lb)  09/22/20 112.5 kg (248 lb)   ASSESSMENT AND PLAN:  1. H/o VT: noted 08/2020. 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 Had been on Tikosyn but this wasnot the typical Tikosyn-induced polymorphic VT and she had had no changes in Tikosyn dosingprior to occurrence.cMRI showedsubendocardial LGE in the basal to mid  inferolateral wall. This appearedto be a coronary disease patternhowevercoronary angiogramdid not show significant coronary disease. She was transitioned from Tikosyn to amiodarone and underwent Medtronic single chamber ICD on 09/15/20. No further VT on device interrogation. She is on amiodarone 200 daily. Denies any further presyncopal spells.  No palpitations.  - Continue amiodarone  200 mg daily, check LFTs and thyroid studies, regular eye exam needed.   2. Atrial fibrillation/flutter: Paroxysmal. 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 She was admitted in 1/18 with atrial fibrillation/RVR and CHF. She was diuresed and had DCCV back to NSR. She had atrial fibrillation ablation in 4/18. In 5/19, she went into atrial fibrillation transiently. In 10/19, she was back in atrial fibrillation and was admitted for Tikosyn initiation and cardioversion.Atypical atrial flutter noted in 12/21 with DCCV to NSR. As outlined above, now off Tikosyn and on amiodarone. Reverted back to rate controlled atypical atrial flutter at post hospital f/u 09/30/20. Underwent repeat DCCV in 3/22. Maintaining NSR on EKG today.  -Continue amiodarone 200 mg daily. Check LFTs and TFTs today. Needs yearly eye exams   - Continue Eliquis 5 mg bid.  3. Claudication: Left leg claudication with absent left PT pulse. 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  4. Chronic systolic =>diastolic CHF: EF 79-48% on echo in 1/18, down from 50-55% in 4/15. 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 ChronicNYHA class II-III symptoms (functional class complicated by heel pain/left leg pain, body habitus and deconditioning).  Weight up recently, but volume status stable today by  exam, REDS clip and Optivol.  I suspect that weight gain may be caloric in the setting of discontinuation of smoking.  - Continue lasix 80 mg bid for now, would not increase.  BMET today.  - Continue Toprol XL 25 mgdaily. -ContinueEntresto 49/51 mg bid.  - Continue eplerenone 25 mg daily.  - Add Farxiga 10 mg daily.  BMET 10 days.  5. CAD: Coronary CTAin 2018showed coronary calcium score in the 82nd percentile with nonobstructive coronary disease. Cathin 2/22showed only luminal irregularities. She denies anginal symptoms  - not on ASA due to Eliquis.  - ContinueVascepa and Livalo.  6. COPD:History of smoking. Recently quit  7. OSA: reports full nightly compliance w/ CPAP.  F/u 1 month NP/PA.   Signed, [name], MD  12/09/2020  [facility] [address] [facility] [place] [address] [phone] (office) [phone] (fax)

## 2020-12-09 NOTE — Patient Instructions (Addendum)
Routine lab work today. Will notify you of abnormal results  START Farxiga 10mg  daily  Repeat labs in 1 week  Keep your follow up in July.

## 2020-12-10 LAB — T3, FREE: T3, Free: 2.3 pg/mL (ref 2.0–4.4)

## 2020-12-14 ENCOUNTER — Other Ambulatory Visit (HOSPITAL_COMMUNITY): Payer: Self-pay | Admitting: *Deleted

## 2020-12-14 DIAGNOSIS — I480 Paroxysmal atrial fibrillation: Secondary | ICD-10-CM

## 2020-12-15 ENCOUNTER — Other Ambulatory Visit: Payer: Self-pay

## 2020-12-16 ENCOUNTER — Ambulatory Visit (INDEPENDENT_AMBULATORY_CARE_PROVIDER_SITE_OTHER): Payer: Medicare Other

## 2020-12-16 DIAGNOSIS — I472 Ventricular tachycardia, unspecified: Secondary | ICD-10-CM

## 2020-12-16 LAB — CUP PACEART REMOTE DEVICE CHECK
Battery Remaining Longevity: 135 mo
Battery Voltage: 3.12 V
Brady Statistic RV Percent Paced: 0.3 %
Date Time Interrogation Session: 20220602012504
HighPow Impedance: 85 Ohm
Implantable Lead Implant Date: 20220302
Implantable Lead Location: 753860
Implantable Pulse Generator Implant Date: 20220302
Lead Channel Impedance Value: 456 Ohm
Lead Channel Impedance Value: 532 Ohm
Lead Channel Pacing Threshold Amplitude: 2.5 V
Lead Channel Pacing Threshold Pulse Width: 0.4 ms
Lead Channel Sensing Intrinsic Amplitude: 5.375 mV
Lead Channel Sensing Intrinsic Amplitude: 5.375 mV
Lead Channel Setting Pacing Amplitude: 5 V
Lead Channel Setting Pacing Pulse Width: 1 ms
Lead Channel Setting Sensing Sensitivity: 0.3 mV

## 2020-12-17 ENCOUNTER — Other Ambulatory Visit (HOSPITAL_COMMUNITY): Payer: Medicare Other

## 2020-12-18 ENCOUNTER — Other Ambulatory Visit: Payer: Self-pay | Admitting: Cardiology

## 2020-12-20 ENCOUNTER — Ambulatory Visit: Payer: Medicare Other | Admitting: Cardiology

## 2020-12-20 ENCOUNTER — Encounter: Payer: Self-pay | Admitting: Cardiology

## 2020-12-20 ENCOUNTER — Other Ambulatory Visit: Payer: Self-pay

## 2020-12-20 VITALS — BP 126/64 | HR 64 | Ht 66.0 in | Wt 263.0 lb

## 2020-12-20 DIAGNOSIS — I5023 Acute on chronic systolic (congestive) heart failure: Secondary | ICD-10-CM | POA: Diagnosis not present

## 2020-12-20 DIAGNOSIS — I472 Ventricular tachycardia, unspecified: Secondary | ICD-10-CM

## 2020-12-20 DIAGNOSIS — Z79899 Other long term (current) drug therapy: Secondary | ICD-10-CM | POA: Diagnosis not present

## 2020-12-20 NOTE — Patient Instructions (Addendum)
Medication Instructions:  Your physician recommends that you continue on your current medications as directed. Please refer to the Current Medication list given to you today.  *If you need a refill on your cardiac medications before your next appointment, please call your pharmacy*   Lab Work: Today: TSH, T3, T4 If you have labs (blood work) drawn today and your tests are completely normal, you will receive your results only by: Marland Kitchen MyChart Message (if you have MyChart) OR . A paper copy in the mail If you have any lab test that is abnormal or we need to change your treatment, we will call you to review the results.   Testing/Procedures: None ordered   Follow-Up: At [facility], you and your health needs are our priority.  As part of our continuing mission to provide you with exceptional heart care, we have created designated Provider Care Teams.  These Care Teams include your primary Cardiologist (physician) and Advanced Practice Providers (APPs -  Physician Assistants and Nurse Practitioners) who all work together to provide you with the care you need, when you need it.  Remote monitoring is used to monitor your Pacemaker or ICD from home. This monitoring reduces the number of office visits required to check your device to one time per year. It allows [name] to keep an eye on the functioning of your device to ensure it is working properly. You are scheduled for a device check from home on 03/17/2021. You may send your transmission at any time that day. If you have a wireless device, the transmission will be sent automatically. After your physician reviews your transmission, you will receive a postcard with your next transmission date.  Your next appointment:   6 month(s)  The format for your next appointment:   In Person  Provider:   Allegra Lai, MD   Thank you for choosing [facility]!!   Trinidad Curet, RN [phone]

## 2020-12-20 NOTE — Telephone Encounter (Signed)
This is a CHF pt, Dr. Mclean 

## 2020-12-20 NOTE — Progress Notes (Signed)
Electrophysiology Office Note   Date:  12/20/2020   ID:  Heidi Stark 05-19-1941, MRN [id number]  PCP:  [name], [name], DO  Cardiologist:  [name] Primary Electrophysiologist:  Dr [name]  CC: Follow up for atrial fibrillation   History of Present Illness: Heidi Stark is a 80 y.o. female who is being seen today for the evaluation of atrial fibrillatoin at the request of [name], [name], *. Presenting today for electrophysiology evaluation.   She has a history of a diastolic heart failure, COPD, DVT, hypertension, hyperlipidemia, OSA on CPAP, atrial fibrillation, and diabetes.  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  She was started on Coumadin.  She was also noted to have a left leg pain and was found to have an occluded left internal iliac.  January 2018 she developed exertional dyspnea.  She was found to be in atrial fibrillation with heart failure.  She is status post ablation April 2018.  She was seen in clinic in 2019 and was found to be in atrial fibrillation and was cardioverted.  She is status post admission for dofetilide loading 04/30/2018.   Today, denies symptoms of palpitations, chest pain, shortness of breath, orthopnea, PND, lower extremity edema, claudication, dizziness, presyncope, syncope, bleeding, or neurologic sequela. The patient is tolerating medications without difficulties.  She presents today complaining of mild shortness of breath.  She is unclear as to how long her shortness of breath has been occurring.  She has no chest pain.  She does continue to smoke, up to a pack a day.  She was initially on Chantix, but this has been recalled.   Past Medical History:  Diagnosis Date  . A-fib ([place])   . Arthritis    "hands, wrists" (11/07/2016)  . CHF (congestive heart failure) ([place])   . Chronic lower back pain   . Constipation   . COPD (chronic obstructive pulmonary disease) ([place])   .  DVT (deep venous thrombosis) ([place])   . Dyspnea   . History of blood transfusion 1990   "related to OR"  . History of kidney stones   . Hyperlipidemia   . Hypertension   . Joint pain   . Lower extremity edema   . OSA on CPAP   . Persistent atrial fibrillation ([place])   . Pneumonia    "several times" (11/07/2016)  . Type II diabetes mellitus ([place])    Past Surgical History:  Procedure Laterality Date  . ATRIAL FIBRILLATION ABLATION N/A 11/07/2016   Procedure: Atrial Fibrillation Ablation;  Surgeon: Ruqayya Ventress [name], MD;  Location: [place] [facility];  Service: Cardiovascular;  Laterality: N/A;  . BACK SURGERY    . CARDIAC CATHETERIZATION    . CARDIOVERSION N/A 08/11/2016   Procedure: CARDIOVERSION;  Surgeon: Larey Dresser, MD;  Location: [place];  Service: Cardiovascular;  Laterality: N/A;  . CARDIOVERSION N/A 12/03/2017   Procedure: CARDIOVERSION;  Surgeon: Larey Dresser, MD;  Location: [facility] ENDOSCOPY;  Service: Cardiovascular;  Laterality: N/A;  . CARDIOVERSION N/A 05/02/2018   Procedure: CARDIOVERSION;  Surgeon: Pixie Casino, MD;  Location: [facility] ENDOSCOPY;  Service: Cardiovascular;  Laterality: N/A;  . CARDIOVERSION N/A 06/23/2020   Procedure: CARDIOVERSION;  Surgeon: [name], MD;  Location: [facility] ENDOSCOPY;  Service: Cardiovascular;  Laterality: N/A;  . CARDIOVERSION N/A 10/11/2020   Procedure: CARDIOVERSION;  Surgeon: Larey Dresser, MD;  Location: [facility] ENDOSCOPY;  Service: Cardiovascular;  Laterality: N/A;  .  CARPAL TUNNEL RELEASE    . CATARACT EXTRACTION W/ INTRAOCULAR LENS  IMPLANT, BILATERAL Bilateral   . COLON SURGERY  1990   vein graft and colon repair after nicked artery with back surgert  . COLONOSCOPY    . CORONARY ANGIOGRAPHY N/A 09/08/2020   Procedure: CORONARY ANGIOGRAPHY;  Surgeon: Larey Dresser, MD;  Location: [place] [facility];  Service: Cardiovascular;  Laterality: N/A;  . CYSTECTOMY     between bladder and kidneys  . GANGLION CYST EXCISION  Left   . ICD IMPLANT N/A 09/15/2020   Procedure: ICD IMPLANT;  Surgeon: Constance Haw, MD;  Location: [place] [facility];  Service: Cardiovascular;  Laterality: N/A;  . KNEE CARTILAGE SURGERY Right 1960s  . [place] SURGERY  1990  . REPAIR ILIAC ARTERY  1990   vein graft and colon repair after nicked artery with back surgert  . TEE WITHOUT CARDIOVERSION N/A 10/31/2013   Procedure: TRANSESOPHAGEAL ECHOCARDIOGRAM (TEE);  Surgeon: Larey Dresser, MD;  Location: [place];  Service: Cardiovascular;  Laterality: N/A;  . TUBAL LIGATION       Current Outpatient Medications  Medication Sig Dispense Refill  . acetaminophen (TYLENOL) 500 MG tablet Take 500 mg by mouth every 6 (six) hours as needed for moderate pain or headache.    Marland Kitchen amiodarone (PACERONE) 200 MG tablet Take 200 mg by mouth daily.    Marland Kitchen apixaban (ELIQUIS) 5 MG TABS tablet Take 1 tablet (5 mg total) by mouth 2 (two) times daily. 60 tablet 11  . dapagliflozin propanediol (FARXIGA) 10 MG TABS tablet Take 1 tablet (10 mg total) by mouth daily before breakfast. 30 tablet 3  . eplerenone (INSPRA) 25 MG tablet Take 1 tablet (25 mg total) by mouth daily. 30 tablet 5  . fenofibrate (TRICOR) 145 MG tablet TAKE 1 TABLET BY MOUTH EVERY DAY 90 tablet 3  . furosemide (LASIX) 40 MG tablet Take 2 tablets (80 mg total) by mouth 2 (two) times daily. 270 tablet 3  . icosapent Ethyl (VASCEPA) 1 g capsule Take 2 g by mouth 2 (two) times daily.    . insulin glargine (LANTUS) 100 UNIT/ML injection Inject 15 Units into the skin at bedtime.     Marland Kitchen LIVALO 4 MG TABS Take 1 tablet by mouth daily.    . metoprolol succinate (TOPROL-XL) 25 MG 24 hr tablet TAKE 1 TABLET BY MOUTH EVERYDAY AT BEDTIME 90 tablet 3  . Multiple Vitamin (MULTIVITAMIN WITH MINERALS) TABS tablet Take 1 tablet by mouth daily.    . potassium chloride (KLOR-CON M10) 10 MEQ tablet Take 2 tablets (20 mEq total) by mouth 2 (two) times daily. 120 tablet 11  . sacubitril-valsartan  (ENTRESTO) 49-51 MG Take 1 tablet by mouth 2 (two) times daily. 60 tablet 3   No current facility-administered medications for this visit.    Allergies:   Neomycin-bacitracin zn-polymyx, Sulfonamide derivatives, Ciprofloxacin, and Spironolactone   Social History:  The patient  reports that she has quit smoking. Her smoking use included cigarettes. She has a 50.00 pack-year smoking history. She has never used smokeless tobacco. She reports that she does not drink alcohol and does not use drugs.   Family History:  The patient's family history includes Cancer in her mother; Diabetes in her father, mother, and another family member; Factor V Leiden deficiency in her daughter; Hypertension in her mother; Melanoma in an other family member; Obesity in her father and mother.   ROS:  Please see the history of present illness.  Otherwise, review of systems is positive for none.   All other systems are reviewed and negative.   PHYSICAL EXAM: VS:  BP 126/64   Pulse 64   Ht 5\' 6"  (1.676 m)   Wt 263 lb (119.3 kg)   BMI 42.45 kg/m  , BMI Body mass index is 42.45 kg/m. GEN: Well nourished, well developed, in no acute distress  HEENT: normal  Neck: no JVD, carotid bruits, or masses Cardiac: RRR; no murmurs, rubs, or gallops,no edema  Respiratory:  clear to auscultation bilaterally, normal work of breathing GI: soft, nontender, nondistended, + BS MS: no deformity or atrophy  Skin: warm and dry Neuro:  Strength and sensation are intact Psych: euthymic mood, full affect  EKG:  EKG is ordered today. Personal review of the ekg ordered shows atrial flutter, rate 122  Recent Labs: 09/14/2020: Magnesium 2.2 10/28/2020: Hemoglobin 14.3; Platelets 400 12/09/2020: ALT 25; B Natriuretic Peptide 106.4; BUN 21; Creatinine, Ser 1.20; Potassium 4.6; Sodium 138; TSH 3.927    Lipid Panel     Component Value Date/Time   CHOL 113 10/26/2020 0000   CHOL 170 06/02/2019 0913   TRIG 184 (A) 10/26/2020 0000   HDL  29 (A) 10/26/2020 0000   HDL 31 (L) 06/02/2019 0913   CHOLHDL 7.9 12/17/2019 1049   VLDL 69 (H) 12/17/2019 1049   LDLCALC 53 10/26/2020 0000   LDLCALC 92 06/02/2019 0913   LDLCALC 77 06/28/2018 1410   LDLDIRECT 42.0 11/30/2014 1226     Wt Readings from Last 3 Encounters:  12/20/20 263 lb (119.3 kg)  12/09/20 265 lb 8 oz (120.4 kg)  10/28/20 253 lb (114.8 kg)      Other studies Reviewed: Additional studies/ records that were reviewed today include: TTE 12/21/17  Review of the above records today demonstrates:  - Left ventricle: The cavity size was normal. Wall thickness was   increased in a pattern of mild LVH. The estimated ejection   fraction was 55%. Although no diagnostic regional wall motion   abnormality was identified, this possibility cannot be completely   excluded on the basis of this study. Features are consistent with   a pseudonormal left ventricular filling pattern, with concomitant   abnormal relaxation and increased filling pressure (grade 2   diastolic dysfunction). - Aortic valve: There was no stenosis. - Mitral valve: Moderately fibrotic annulus. There was no   significant regurgitation. - Left atrium: The atrium was moderately dilated. - Right ventricle: The cavity size was normal. Systolic function   was normal. - Right atrium: The atrium was mildly dilated. - Tricuspid valve: Peak RV-RA gradient (S): 16 mm Hg. - Pulmonary arteries: PA peak pressure: 19 mm Hg (S). - Inferior vena cava: The vessel was normal in size. The   respirophasic diameter changes were in the normal range (>= 50%),   consistent with normal central venous pressure.   ASSESSMENT AND PLAN:  1.  Persistent atrial fibrillation/atrial flutter: Currently on Eliquis and dofetilide, monitoring for high risk medication.  CHA2DS2-VASc of 6.  Unfortunately she is back in atrial flutter today.  She does have symptoms of shortness of breath.  We Heidi Stark plan for cardioversion.  2.  Chronic  systolic heart failure: Ejection fraction has improved.  Currently on Toprol-XL, eplerenone, Lasix, Entresto.    3.  Coronary artery disease: Calcium score in the 82nd percentile.  Continue Crestor.  4.  COPD: Advised to quit smoking  5.  Obstructive sleep apnea: CPAP compliance encouraged   Current medicines  are reviewed at length with the patient today.   The patient does not have concerns regarding her medicines.  The following changes were made today: none  Labs/ tests ordered today include:  Orders Placed This Encounter  Procedures  . TSH  . T3, free  . T4, free  . EKG 12-Lead     Disposition:   FU with Heidi Stark 3 months  Signed, Tonetta Napoles [name], MD  12/20/2020 2:14 PM     [address] [place] [address] [phone] (office) [phone] (fax)    Electrophysiology Office Note   Date:  12/20/2020   ID:  Nikkia, [place] Dec 02, 1940, MRN [id number]  PCP:  [name], [name], DO  Cardiologist:  [name] Primary Electrophysiologist:  Dr [name]  CC: Follow up for atrial fibrillation   History of Present Illness: CLYDIE DILLEN is a 80 y.o. female who is being seen today for the evaluation of atrial fibrillatoin at the request of [name], [name], *. Presenting today for electrophysiology evaluation.    She has a history of diastolic heart failure, COPD, DVTs, hypertension, hyperlipidemia, OSA on CPAP, atrial fibrillation, and diabetes.  She wears oxygen at night.  She is status post A. fib ablation 11/07/2016.  She was hospitalized 09/12/2020 with 3 episodes of presyncope.  She had a history of VT and was on amiodarone.  She is now status post Medtronic ICD.  Today, denies symptoms of palpitations, chest pain, shortness of breath, orthopnea, PND, lower extremity edema, claudication, dizziness, presyncope, syncope, bleeding, or neurologic sequela. The patient is tolerating medications without difficulties.  Since  being seen she has done well.  She has no chest pain or shortness of breath.  She is able do all of her daily activities.  She has not had any further episodes of ventricular tachycardia.  She has had minimal atrial fibrillation as confirmed on device interrogation.   Past Medical History:  Diagnosis Date  . A-fib ([place])   . Arthritis    "hands, wrists" (11/07/2016)  . CHF (congestive heart failure) ([place])   . Chronic lower back pain   . Constipation   . COPD (chronic obstructive pulmonary disease) ([place])   . DVT (deep venous thrombosis) ([place])   . Dyspnea   . History of blood transfusion 1990   "related to OR"  . History of kidney stones   . Hyperlipidemia   . Hypertension   . Joint pain   . Lower extremity edema   . OSA on CPAP   . Persistent atrial fibrillation ([place])   . Pneumonia    "several times" (11/07/2016)  . Type II diabetes mellitus ([place])    Past Surgical History:  Procedure Laterality Date  . ATRIAL FIBRILLATION ABLATION N/A 11/07/2016   Procedure: Atrial Fibrillation Ablation;  Surgeon: Bane Hagy [name], MD;  Location: [place] [facility];  Service: Cardiovascular;  Laterality: N/A;  . BACK SURGERY    . CARDIAC CATHETERIZATION    . CARDIOVERSION N/A 08/11/2016   Procedure: CARDIOVERSION;  Surgeon: Larey Dresser, MD;  Location: [facility] ENDOSCOPY;  Service: Cardiovascular;  Laterality: N/A;  . CARDIOVERSION N/A 12/03/2017   Procedure: CARDIOVERSION;  Surgeon: Larey Dresser, MD;  Location: [facility] ENDOSCOPY;  Service: Cardiovascular;  Laterality: N/A;  . CARDIOVERSION N/A 05/02/2018   Procedure: CARDIOVERSION;  Surgeon: Pixie Casino, MD;  Location: [facility] ENDOSCOPY;  Service: Cardiovascular;  Laterality: N/A;  . CARDIOVERSION N/A 06/23/2020   Procedure: CARDIOVERSION;  Surgeon: Johney Frame,  Greer Ee, MD;  Location: [facility] ENDOSCOPY;  Service: Cardiovascular;  Laterality: N/A;  . CARDIOVERSION N/A 10/11/2020   Procedure: CARDIOVERSION;  Surgeon: Larey Dresser, MD;  Location: [facility]  ENDOSCOPY;  Service: Cardiovascular;  Laterality: N/A;  . CARPAL TUNNEL RELEASE    . CATARACT EXTRACTION W/ INTRAOCULAR LENS  IMPLANT, BILATERAL Bilateral   . COLON SURGERY  1990   vein graft and colon repair after nicked artery with back surgert  . COLONOSCOPY    . CORONARY ANGIOGRAPHY N/A 09/08/2020   Procedure: CORONARY ANGIOGRAPHY;  Surgeon: Larey Dresser, MD;  Location: [place] [facility];  Service: Cardiovascular;  Laterality: N/A;  . CYSTECTOMY     between bladder and kidneys  . GANGLION CYST EXCISION Left   . ICD IMPLANT N/A 09/15/2020   Procedure: ICD IMPLANT;  Surgeon: Constance Haw, MD;  Location: [place] [facility];  Service: Cardiovascular;  Laterality: N/A;  . KNEE CARTILAGE SURGERY Right 1960s  . [place] SURGERY  1990  . REPAIR ILIAC ARTERY  1990   vein graft and colon repair after nicked artery with back surgert  . TEE WITHOUT CARDIOVERSION N/A 10/31/2013   Procedure: TRANSESOPHAGEAL ECHOCARDIOGRAM (TEE);  Surgeon: Larey Dresser, MD;  Location: [place];  Service: Cardiovascular;  Laterality: N/A;  . TUBAL LIGATION       Current Outpatient Medications  Medication Sig Dispense Refill  . acetaminophen (TYLENOL) 500 MG tablet Take 500 mg by mouth every 6 (six) hours as needed for moderate pain or headache.    Marland Kitchen amiodarone (PACERONE) 200 MG tablet Take 200 mg by mouth daily.    Marland Kitchen apixaban (ELIQUIS) 5 MG TABS tablet Take 1 tablet (5 mg total) by mouth 2 (two) times daily. 60 tablet 11  . dapagliflozin propanediol (FARXIGA) 10 MG TABS tablet Take 1 tablet (10 mg total) by mouth daily before breakfast. 30 tablet 3  . eplerenone (INSPRA) 25 MG tablet Take 1 tablet (25 mg total) by mouth daily. 30 tablet 5  . fenofibrate (TRICOR) 145 MG tablet TAKE 1 TABLET BY MOUTH EVERY DAY 90 tablet 3  . furosemide (LASIX) 40 MG tablet Take 2 tablets (80 mg total) by mouth 2 (two) times daily. 270 tablet 3  . icosapent Ethyl (VASCEPA) 1 g capsule Take 2 g by mouth 2 (two)  times daily.    . insulin glargine (LANTUS) 100 UNIT/ML injection Inject 15 Units into the skin at bedtime.     Marland Kitchen LIVALO 4 MG TABS Take 1 tablet by mouth daily.    . metoprolol succinate (TOPROL-XL) 25 MG 24 hr tablet TAKE 1 TABLET BY MOUTH EVERYDAY AT BEDTIME 90 tablet 3  . Multiple Vitamin (MULTIVITAMIN WITH MINERALS) TABS tablet Take 1 tablet by mouth daily.    . potassium chloride (KLOR-CON M10) 10 MEQ tablet Take 2 tablets (20 mEq total) by mouth 2 (two) times daily. 120 tablet 11  . sacubitril-valsartan (ENTRESTO) 49-51 MG Take 1 tablet by mouth 2 (two) times daily. 60 tablet 3   No current facility-administered medications for this visit.    Allergies:   Neomycin-bacitracin zn-polymyx, Sulfonamide derivatives, Ciprofloxacin, and Spironolactone   Social History:  The patient  reports that she has quit smoking. Her smoking use included cigarettes. She has a 50.00 pack-year smoking history. She has never used smokeless tobacco. She reports that she does not drink alcohol and does not use drugs.   Family History:  The patient's family history includes Cancer in her mother; Diabetes in her  father, mother, and another family member; Factor V Leiden deficiency in her daughter; Hypertension in her mother; Melanoma in an other family member; Obesity in her father and mother.   ROS:  Please see the history of present illness.   Otherwise, review of systems is positive for none.   All other systems are reviewed and negative.   PHYSICAL EXAM: VS:  BP 126/64   Pulse 64   Ht 5\' 6"  (1.676 m)   Wt 263 lb (119.3 kg)   BMI 42.45 kg/m  , BMI Body mass index is 42.45 kg/m. GEN: Well nourished, well developed, in no acute distress  HEENT: normal  Neck: no JVD, carotid bruits, or masses Cardiac: RRR; no murmurs, rubs, or gallops,no edema  Respiratory:  clear to auscultation bilaterally, normal work of breathing GI: soft, nontender, nondistended, + BS MS: no deformity or atrophy  Skin: warm and  dry, device site well healed Neuro:  Strength and sensation are intact Psych: euthymic mood, full affect  EKG:  EKG is ordered today. Personal review of the ekg ordered shows sinus rhythm, rate 64  Personal review of the device interrogation today. Results in [place]: 09/14/2020: Magnesium 2.2 10/28/2020: Hemoglobin 14.3; Platelets 400 12/09/2020: ALT 25; B Natriuretic Peptide 106.4; BUN 21; Creatinine, Ser 1.20; Potassium 4.6; Sodium 138; TSH 3.927    Lipid Panel     Component Value Date/Time   CHOL 113 10/26/2020 0000   CHOL 170 06/02/2019 0913   TRIG 184 (A) 10/26/2020 0000   HDL 29 (A) 10/26/2020 0000   HDL 31 (L) 06/02/2019 0913   CHOLHDL 7.9 12/17/2019 1049   VLDL 69 (H) 12/17/2019 1049   LDLCALC 53 10/26/2020 0000   LDLCALC 92 06/02/2019 0913   LDLCALC 77 06/28/2018 1410   LDLDIRECT 42.0 11/30/2014 1226     Wt Readings from Last 3 Encounters:  12/20/20 263 lb (119.3 kg)  12/09/20 265 lb 8 oz (120.4 kg)  10/28/20 253 lb (114.8 kg)      Other studies Reviewed: Additional studies/ records that were reviewed today include: TTE 12/21/17  Review of the above records today demonstrates:  - Left ventricle: The cavity size was normal. Wall thickness was   increased in a pattern of mild LVH. The estimated ejection   fraction was 55%. Although no diagnostic regional wall motion   abnormality was identified, this possibility cannot be completely   excluded on the basis of this study. Features are consistent with   a pseudonormal left ventricular filling pattern, with concomitant   abnormal relaxation and increased filling pressure (grade 2   diastolic dysfunction). - Aortic valve: There was no stenosis. - Mitral valve: Moderately fibrotic annulus. There was no   significant regurgitation. - Left atrium: The atrium was moderately dilated. - Right ventricle: The cavity size was normal. Systolic function   was normal. - Right atrium: The atrium was mildly  dilated. - Tricuspid valve: Peak RV-RA gradient (S): 16 mm Hg. - Pulmonary arteries: PA peak pressure: 19 mm Hg (S). - Inferior vena cava: The vessel was normal in size. The   respirophasic diameter changes were in the normal range (>= 50%),   consistent with normal central venous pressure.   ASSESSMENT AND PLAN:  1.  Paroxysmal atrial fibrillation: Currently on Eliquis and amiodarone.  CHA2DS2-VASc of 6.  High risk medication monitoring.  Minimal episodes of atrial fibrillation since her amiodarone load.  We Bentlee Benningfield continue with current management.  2.  Chronic systolic heart  failure: Ejection fraction is improved.  Currently on Toprol-XL, eplerenone, Lasix, Entresto.  She is status post Medtronic ICD implanted 09/15/2020 for ventricular tachycardia.  Device functioning appropriately.  No changes.  3.  Coronary artery disease: Calcium score in the 82nd percentile.  Continue Crestor.  4.  COPD: Complete cessation encouraged  5.  Obstructive sleep apnea: CPAP compliance encouraged   6.  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  Is status post Medtronic ICD.  No further ventricular tachycardia since amiodarone loading.  Current medicines are reviewed at length with the patient today.   The patient does not have concerns regarding her medicines.  The following changes were made today: None  Labs/ tests ordered today include:  Orders Placed This Encounter  Procedures  . TSH  . T3, free  . T4, free  . EKG 12-Lead     Disposition:   FU with Lamae Fosco 6 months  Signed, Khalil Belote [name], MD  12/20/2020 2:14 PM     [address] [place] [address] [phone] (office) [phone] (fax)

## 2020-12-21 LAB — T3, FREE: T3, Free: 2.2 pg/mL (ref 2.0–4.4)

## 2020-12-21 LAB — T4, FREE: Free T4: 1.51 ng/dL (ref 0.82–1.77)

## 2020-12-21 LAB — TSH: TSH: 5.51 u[IU]/mL — ABNORMAL HIGH (ref 0.450–4.500)

## 2021-01-07 NOTE — Progress Notes (Signed)
Remote ICD transmission.   

## 2021-01-27 ENCOUNTER — Ambulatory Visit (HOSPITAL_COMMUNITY)
Admission: RE | Admit: 2021-01-27 | Discharge: 2021-01-27 | Disposition: A | Discharge status: Home / Self Care | Payer: Medicare Other | Source: Ambulatory Visit | Attending: Cardiology | Admitting: Cardiology

## 2021-01-27 ENCOUNTER — Other Ambulatory Visit: Payer: Self-pay

## 2021-01-27 ENCOUNTER — Encounter (HOSPITAL_COMMUNITY): Payer: Self-pay | Admitting: Cardiology

## 2021-01-27 VITALS — BP 118/76 | HR 60 | Wt 265.6 lb

## 2021-01-27 DIAGNOSIS — I472 Ventricular tachycardia, unspecified: Secondary | ICD-10-CM

## 2021-01-27 DIAGNOSIS — I2510 Atherosclerotic heart disease of native coronary artery without angina pectoris: Secondary | ICD-10-CM | POA: Diagnosis not present

## 2021-01-27 DIAGNOSIS — Z882 Allergy status to sulfonamides status: Secondary | ICD-10-CM | POA: Insufficient documentation

## 2021-01-27 DIAGNOSIS — I509 Heart failure, unspecified: Secondary | ICD-10-CM | POA: Diagnosis not present

## 2021-01-27 DIAGNOSIS — Z8249 Family history of ischemic heart disease and other diseases of the circulatory system: Secondary | ICD-10-CM | POA: Insufficient documentation

## 2021-01-27 DIAGNOSIS — I484 Atypical atrial flutter: Secondary | ICD-10-CM | POA: Insufficient documentation

## 2021-01-27 DIAGNOSIS — Z881 Allergy status to other antibiotic agents status: Secondary | ICD-10-CM | POA: Diagnosis not present

## 2021-01-27 DIAGNOSIS — M79605 Pain in left leg: Secondary | ICD-10-CM | POA: Diagnosis not present

## 2021-01-27 DIAGNOSIS — Z95810 Presence of automatic (implantable) cardiac defibrillator: Secondary | ICD-10-CM | POA: Diagnosis not present

## 2021-01-27 DIAGNOSIS — I5042 Chronic combined systolic (congestive) and diastolic (congestive) heart failure: Secondary | ICD-10-CM | POA: Diagnosis not present

## 2021-01-27 DIAGNOSIS — Z79899 Other long term (current) drug therapy: Secondary | ICD-10-CM | POA: Diagnosis not present

## 2021-01-27 DIAGNOSIS — Z87891 Personal history of nicotine dependence: Secondary | ICD-10-CM | POA: Insufficient documentation

## 2021-01-27 DIAGNOSIS — I350 Nonrheumatic aortic (valve) stenosis: Secondary | ICD-10-CM | POA: Diagnosis not present

## 2021-01-27 DIAGNOSIS — J449 Chronic obstructive pulmonary disease, unspecified: Secondary | ICD-10-CM | POA: Insufficient documentation

## 2021-01-27 DIAGNOSIS — I480 Paroxysmal atrial fibrillation: Secondary | ICD-10-CM | POA: Diagnosis not present

## 2021-01-27 DIAGNOSIS — I5022 Chronic systolic (congestive) heart failure: Secondary | ICD-10-CM | POA: Diagnosis not present

## 2021-01-27 DIAGNOSIS — M545 Low back pain, unspecified: Secondary | ICD-10-CM | POA: Insufficient documentation

## 2021-01-27 DIAGNOSIS — E1151 Type 2 diabetes mellitus with diabetic peripheral angiopathy without gangrene: Secondary | ICD-10-CM | POA: Diagnosis not present

## 2021-01-27 DIAGNOSIS — R55 Syncope and collapse: Secondary | ICD-10-CM | POA: Insufficient documentation

## 2021-01-27 DIAGNOSIS — I110 Hypertensive heart disease with heart failure: Secondary | ICD-10-CM | POA: Diagnosis present

## 2021-01-27 DIAGNOSIS — G4733 Obstructive sleep apnea (adult) (pediatric): Secondary | ICD-10-CM | POA: Insufficient documentation

## 2021-01-27 DIAGNOSIS — Z794 Long term (current) use of insulin: Secondary | ICD-10-CM | POA: Insufficient documentation

## 2021-01-27 DIAGNOSIS — Z7901 Long term (current) use of anticoagulants: Secondary | ICD-10-CM | POA: Insufficient documentation

## 2021-01-27 DIAGNOSIS — Z833 Family history of diabetes mellitus: Secondary | ICD-10-CM | POA: Diagnosis not present

## 2021-01-27 LAB — LIPID PANEL
Cholesterol: 186 mg/dL (ref 0–200)
HDL: 34 mg/dL — ABNORMAL LOW (ref >40)
LDL Cholesterol: 94 mg/dL (ref 0–99)
Total CHOL/HDL Ratio: 5.5 RATIO
Triglycerides: 292 mg/dL — ABNORMAL HIGH (ref <150)
VLDL: 58 mg/dL — ABNORMAL HIGH (ref 0–40)

## 2021-01-27 LAB — COMPREHENSIVE METABOLIC PANEL
ALT: 25 U/L (ref 0–44)
AST: 34 U/L (ref 15–41)
Albumin: 3.5 g/dL (ref 3.5–5.0)
Alkaline Phosphatase: 52 U/L (ref 38–126)
Anion gap: 11 (ref 5–15)
BUN: 27 mg/dL — ABNORMAL HIGH (ref 8–23)
CO2: 28 mmol/L (ref 22–32)
Calcium: 8.7 mg/dL — ABNORMAL LOW (ref 8.9–10.3)
Chloride: 96 mmol/L — ABNORMAL LOW (ref 98–111)
Creatinine, Ser: 1.26 mg/dL — ABNORMAL HIGH (ref 0.44–1.00)
GFR, Estimated: 43 mL/min — ABNORMAL LOW (ref >60)
Glucose, Bld: 197 mg/dL — ABNORMAL HIGH (ref 70–99)
Potassium: 4.2 mmol/L (ref 3.5–5.1)
Sodium: 135 mmol/L (ref 135–145)
Total Bilirubin: 0.5 mg/dL (ref 0.3–1.2)
Total Protein: 7.3 g/dL (ref 6.5–8.1)

## 2021-01-27 LAB — TSH: TSH: 4.435 u[IU]/mL (ref 0.350–4.500)

## 2021-01-27 NOTE — Progress Notes (Signed)
Advanced Heart Failure Clinic Note   PCP:  Ann Held, DO  HF Cardiologist: Dr. [name]  History of Present Illness: Heidi Stark is a 80 y.o. female who has history of HTN, DM, hyperlipidemia.  She was admitted in 4/15 with splenic and renal infarcts.  She was found to have paroxysmal atrial fibrillation.  In 4/15, she had fever and flank pain.  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  TEE showed prominent aortic plaque.  While she was in the hospital, she was noted to have a brief run of atrial fibrillation and was started on coumadin.     She has left leg pain with ambulation after about 100 feet, LE dopplers 5/15 showed occluded left external iliac artery.  This has been present ever since she had left iliac damage with a prior back surgery.     She was stable until 1/18, when she developed palpitations and exertional dyspnea.  She was admitted to [facility] with atrial fibrillation/RVR and acute systolic CHF.  EF was found to be 30-35%, down from 50-55% in 2015.  She was diuresed and underwent DCCV back to NSR.  She subsequently had atrial fibrillation ablation in 4/18.  She went back into atrial fibrillation in 5/19 and had DCCV back today NSR.    Echo was done 6/19 and reviewed, EF 55%, mild LVH, moderate diastolic dysfunction, normal RV size and systolic function.    With recurrent atrial fibrillation, she was admitted in 10/19 to start Tikosyn.  She was cardioverted back to NSR.   She had a recent admission in 2/22 with lightheadedness/presyncope that appeared to correlate with runs of VT.  She was on Tikosyn for atrial fibrillation though VT not thought to be due to Tikosyn (not polymorphic, QT not prolonged).  Tikosyn was stopped and amiodarone was started.  Ecoh in 2/22 showed EF 50-55% but cMRI (2/22) showed EF 40% with subendocardial scar in the basal to mid inferolateral wall, RV EF was 38%.  She had coronary angiography showing  no obstructive disease.  VT subsided and she was sent home on amiodarone.    09/13/20, she had 3 episodes of presyncope in succession while sitting at a desk.  No prodrome, each episode of severe lightheadedness lasted maybe 30 seconds.  She did not pass out. Because of these events, she came to the ER. She was admitted for observation and amiodarone gtt was restarted.  She had no VT overnight.  She remained in atypical atrial flutter today (was in atypical flutter at last admission). She had a single chamber Medtronic ICD placed 09/15/20. Discharge weight 256 lbs.   She had post hospital f/u on 09/22/20 and was noted to be back in atrial flutter w/ CVR. Volume status was ok and she endorsed stable NYHA Class II-III symptoms. She was set up for repeat DCCV 3 weeks later.   She underwent DCCV on 10/11/20 and converted back to NSR after 1 shock at 200J.   She returns for followup of CHF and atrial fibrillation.  She is in NSR, no recent AF on device interrogation.  She has quit smoking since 2/22.  Weight is stable.  She is not very mobile due to chronic leg and low back pain.  No lightheadedness, no chest pain. She uses a cane or walker to get around.  No dyspnea but is limited in walking by her back pain.    ECG (personally reviewed): sinus brady  at 59, 1st AVB 280 msec  MDT device interrogation: Stable impedance.  No AF/VT.   Labs (4/15): K 4.1, creatinine 0.7, HCT 46.2 Labs (7/15): K 3.9, creatinine 0.8, HCT 50 Labs (12/15): K 5, creatinine 0.7, HDL 33, LDL 57 Labs (5/16): K 4, creatinine 0.84, TGs 256, HDL 29, LDL 42, HCT 41.2 Labs (1/18): K 3.4, creatinine 0.78 Labs (2/18): K 4.4, creatinine 0.8, free T3/T4 normal, mildly elevated TSH, LFTs normal, hgb 15.6 Labs (4/18): K 4.2, creatinine 0.86, TSH elevated, free T3 and free T4 normal Labs (5/19): K 3.9, creatinine 0.78 Labs (6/19): K 4.4, creatinine 0.88 Labs (12/19): K 4.2, creatinine 0.85, TGs 322, LDL 77 Labs (2/20): LDL 46, TGs 362 Labs  (3/20): K 4.7, creatinine 1.07 Labs (11/20): TGs 282, LDL 92 Labs (3/22): K 4.1, creatinine 1.23 Labs (4/22): K 4.2, creatinine 1.2, LFTs normal, TSH mildly elevated, free T4 mildly elevated, normal T3.  Labs (6/22): TSH mildly elevated, free T3 and T4 normal  PMH: 1.Type II diabetes 2. HTN 3. Hyperlipidemia 4. COPD: has quit smoking.  5. CAD: Coronary CTA (4/18) with nonobstructive coronary disease, calcium score 335 Agatston units (82nd percentile).  - Coronary angiography (2/22) with no obstructive disease 6. Paroxysmal atrial fibrillation: First noted in 4/15.  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   - Event monitor (4/15) with no atrial fibrillation.  - Admission with atrial fibrillation/RVR in 1/18.  - Atrial fibrillation ablation in 4/18.  - DCCV in 5/19.  - Tikosyn started + DCCV in 10/19. VT => Tikosyn stopped and amiodarone begun in 3/22.   - S/p single chamber MDT ICD 09/15/20 - DCCV to NSR in 3/22.  7. Chronic systolic CHF: ?tachycardia-mediated as first noted in setting of atrial fibrillation with RVR.  - TEE (4/15) with EF 50-55%, mild LVH, grade III-IV plaque in descending thoracic aorta, RV normal, no PFO, peak RV-RA gradient 42 mmHg.   - Echo (1/18) with EF 30-35%, diffuse hypokinesis, moderate LVH, mildly dilated RV, PASP 55 mmHg.  - Echo (4/18) with EF 50%, anterolateral/inferolateral mild hypokinesis, mild aortic stenosis, IVC normal.  - Echo (6/19) with EF 55%, mild LVH, moderate diastolic dysfunction, normal RV size and systolic function.  - cMRI (2/22) with EF 40% with subendocardial scar in the basal to mid inferolateral wall, RV EF was 38%. - Echo (2/22) with EF 50-55%, normal RV.  7. H/o diskectomy. 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 Lower extremity arterial dopplers (5/15) with occluded left external iliac artery.  - ABIs (5/18): 1.16 (normal) on right, 0.56 on left.  9. Aortic stenosis: Mild  on 4/18 echo.  10. OSA: Uses CPAP 11. VT: 2/22.  No significant CAD on cath in 2/22.  cMRI with EF 40% with subendocardial scar in the basal to mid inferolateral wall, RV EF was 38%. - Medtronic ICD placed 3/22.   Current Outpatient Medications  Medication Sig Dispense Refill   acetaminophen (TYLENOL) 500 MG tablet Take 500 mg by mouth every 6 (six) hours as needed for moderate pain or headache.     amiodarone (PACERONE) 200 MG tablet Take 200 mg by mouth daily.     apixaban (ELIQUIS) 5 MG TABS tablet Take 1 tablet (5 mg total) by mouth 2 (two) times daily. 60 tablet 11   dapagliflozin propanediol (FARXIGA) 10 MG TABS tablet Take 1 tablet (10 mg total) by mouth daily before breakfast. 30 tablet 3   eplerenone (INSPRA) 25 MG tablet  TAKE 1 TABLET (25 MG TOTAL) BY MOUTH DAILY. 90 tablet 1   fenofibrate (TRICOR) 145 MG tablet TAKE 1 TABLET BY MOUTH EVERY DAY 90 tablet 3   furosemide (LASIX) 40 MG tablet Take 2 tablets (80 mg total) by mouth 2 (two) times daily. 270 tablet 3   icosapent Ethyl (VASCEPA) 1 g capsule Take 2 g by mouth 2 (two) times daily.     LIVALO 4 MG TABS Take 1 tablet by mouth daily.     metoprolol succinate (TOPROL-XL) 25 MG 24 hr tablet TAKE 1 TABLET BY MOUTH EVERYDAY AT BEDTIME 90 tablet 3   Multiple Vitamin (MULTIVITAMIN WITH MINERALS) TABS tablet Take 1 tablet by mouth daily.     potassium chloride (KLOR-CON M10) 10 MEQ tablet Take 2 tablets (20 mEq total) by mouth 2 (two) times daily. 120 tablet 11   sacubitril-valsartan (ENTRESTO) 49-51 MG Take 1 tablet by mouth 2 (two) times daily. 60 tablet 3   insulin glargine (LANTUS) 100 UNIT/ML injection Inject 15 Units into the skin at bedtime.  (Patient not taking: Reported on 01/27/2021)     No current facility-administered medications for this encounter.    Allergies:   Neomycin-bacitracin zn-polymyx, Sulfonamide derivatives, Ciprofloxacin, and Spironolactone   Social History:  The patient  reports that she has quit smoking.  Her smoking use included cigarettes. She has a 50.00 pack-year smoking history. She has never used smokeless tobacco. She reports that she does not drink alcohol and does not use drugs.   Family History:  The patient's family history includes Cancer in her mother; Diabetes in her father, mother, and another family member; Factor V Leiden deficiency in her daughter; Hypertension in her mother; Melanoma in an other family member; Obesity in her father and mother.   ROS:  Please see the history of present illness.   All other systems are personally reviewed and negative.   Exam:   BP 118/76   Pulse 60   Wt 120.5 kg (265 lb 9.6 oz)   SpO2 93%   BMI 42.87 kg/m   PHYSICAL EXAM: General: NAD Neck: No JVD, no thyromegaly or thyroid nodule.  Lungs: Occasional rhonchi CV: Nondisplaced PMI.  Heart regular S1/S2, no S3/S4, no murmur.  No peripheral edema.  No carotid bruit.  Unable to palpate left pedal pulses.  Abdomen: Soft, nontender, no hepatosplenomegaly, no distention.  Skin: Intact without lesions or rashes.  Neurologic: Alert and oriented x 3.  Psych: Normal affect. Extremities: No clubbing or cyanosis.  HEENT: Normal.   Recent Labs: 09/14/2020: Magnesium 2.2 10/28/2020: Hemoglobin 14.3; Platelets 400 12/09/2020: B Natriuretic Peptide 106.4 01/27/2021: ALT 25; BUN 27; Creatinine, Ser 1.26; Potassium 4.2; Sodium 135; TSH 4.435  Personally reviewed   Wt Readings from Last 3 Encounters:  01/27/21 120.5 kg (265 lb 9.6 oz)  12/20/20 119.3 kg (263 lb)  12/09/20 120.4 kg (265 lb 8 oz)   ASSESSMENT AND PLAN:  1. H/o VT: noted 08/2020. 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 Had been on Tikosyn but this was not the typical Tikosyn-induced polymorphic VT and she had had no changes in Tikosyn dosing prior to occurrence. cMRI showed subendocardial LGE in the basal to mid inferolateral wall. This appeared to be a coronary disease pattern however coronary angiogram did not show  significant coronary disease.  She was transitioned from Tikosyn to amiodarone and underwent Medtronic single chamber ICD on 09/15/20. No further VT on device interrogation. She is on amiodarone 200 daily. Denies any further presyncopal spells.  No palpitations.  - Continue amiodarone 200 mg daily, check LFTs and TSH, regular eye exam needed.   2. Atrial fibrillation/flutter: Paroxysmal.  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 She was admitted in 1/18 with atrial fibrillation/RVR and CHF.  She was diuresed and had DCCV back to NSR. She had atrial fibrillation ablation in 4/18.  In 5/19, she went into atrial fibrillation transiently.  In 10/19, she was back in atrial fibrillation and was admitted for Tikosyn initiation and cardioversion.  Atypical atrial flutter noted in 12/21 with DCCV to NSR. As outlined above, now off Tikosyn and on amiodarone. Reverted back to rate controlled atypical atrial flutter at post hospital f/u 09/30/20. Underwent repeat DCCV in 3/22. Maintaining NSR on EKG today.  - Continue amiodarone 200 mg daily. Check LFTs and TFTs today. Needs yearly eye exams   - Continue Eliquis 5 mg bid.  3. Claudication: Left leg claudication with absent left PT pulse.  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   4. Chronic systolic => diastolic CHF: EF 45-80% on echo in 1/18, down from 50-55% in 4/15.  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 In 2/22, cMRI showed LV EF 40% with RV E 38%, inferolateral subendocardial LGE, but echo in 2/22 showed EF 50-55%.  Chronic NYHA class II-III symptoms (functional class complicated by heel pain/left leg pain, body habitus and deconditioning).  Weight stable, she is not volume overloaded on exam.  - Continue lasix 80 mg bid for now.  BMET today.  - Continue Toprol XL 25 mg daily.    - Continue Entresto 49/51 mg bid.  - Continue eplerenone 25 mg daily.  - Continue dapagliflozin 10 mg daily.  5. CAD: Coronary CTA in 2018 showed coronary calcium score in the 82nd percentile with nonobstructive coronary disease. Cath in 2/22 showed only luminal irregularities. She denies anginal symptoms  - not on ASA due to Eliquis.  - Continue Vascepa and Livalo. Check lipids.  6. COPD:  History of smoking. Recently quit  7. OSA: reports full nightly compliance w/ CPAP.   F/u 4 months.    Signed, [name], MD  01/27/2021  [facility] [address] [facility] [place] [address] [phone] (office) [phone] (fax)

## 2021-01-27 NOTE — Patient Instructions (Signed)
No medication changes   Labs done today, your results will be available in MyChart, we will contact you for abnormal readings.  Your physician recommends that you schedule a follow-up appointment in: 4 months  If you have any questions or concerns before your next appointment please send [name] a message through [place] or call our office at [phone].    TO LEAVE A MESSAGE FOR THE NURSE SELECT OPTION 2, PLEASE LEAVE A MESSAGE INCLUDING: YOUR NAME DATE OF BIRTH CALL BACK NUMBER REASON FOR CALL**this is important as we prioritize the call backs  YOU WILL RECEIVE A CALL BACK THE SAME DAY AS LONG AS YOU CALL BEFORE 4:00 PM milAt the [facility], you and your health needs are our priority. As part of our continuing mission to provide you with exceptional heart care, we have created designated Provider Care Teams. These Care Teams include your primary Cardiologist (physician) and Advanced Practice Providers (APPs- Physician Assistants and Nurse Practitioners) who all work together to provide you with the care you need, when you need it.   You may see any of the following providers on your designated Care Team at your next follow up: Dr Glori Bickers Dr [name] Dr Patrice Paradise, NP Lyda Jester, [place] Ginnie Smart Audry Riles, PharmD   Please be sure to bring in all your medications bottles to every appointment.

## 2021-02-25 ENCOUNTER — Other Ambulatory Visit (HOSPITAL_COMMUNITY): Payer: Self-pay | Admitting: Cardiology

## 2021-03-02 ENCOUNTER — Other Ambulatory Visit: Payer: Self-pay | Admitting: Cardiology

## 2021-03-08 ENCOUNTER — Other Ambulatory Visit (HOSPITAL_COMMUNITY): Payer: Self-pay | Admitting: Cardiology

## 2021-03-17 ENCOUNTER — Ambulatory Visit (INDEPENDENT_AMBULATORY_CARE_PROVIDER_SITE_OTHER): Payer: Medicare Other

## 2021-03-17 DIAGNOSIS — I472 Ventricular tachycardia, unspecified: Secondary | ICD-10-CM

## 2021-03-22 LAB — CUP PACEART REMOTE DEVICE CHECK
Battery Remaining Longevity: 134 mo
Battery Voltage: 3.07 V
Brady Statistic RV Percent Paced: 0.2 %
Date Time Interrogation Session: 20220901033625
HighPow Impedance: 89 Ohm
Implantable Lead Implant Date: 20220302
Implantable Lead Location: 753860
Implantable Pulse Generator Implant Date: 20220302
Lead Channel Impedance Value: 589 Ohm
Lead Channel Impedance Value: 665 Ohm
Lead Channel Pacing Threshold Amplitude: 2.5 V
Lead Channel Pacing Threshold Pulse Width: 0.4 ms
Lead Channel Sensing Intrinsic Amplitude: 5.75 mV
Lead Channel Sensing Intrinsic Amplitude: 5.75 mV
Lead Channel Setting Pacing Amplitude: 5 V
Lead Channel Setting Pacing Pulse Width: 1 ms
Lead Channel Setting Sensing Sensitivity: 0.3 mV

## 2021-03-29 NOTE — Progress Notes (Signed)
Remote ICD transmission.   

## 2021-04-15 ENCOUNTER — Encounter (HOSPITAL_COMMUNITY): Payer: Self-pay

## 2021-04-15 ENCOUNTER — Inpatient Hospital Stay (HOSPITAL_COMMUNITY)
Admission: EM | Admit: 2021-04-15 | Discharge: 2021-04-23 | DRG: 177 | Disposition: A | Discharge status: Home / Self Care | Payer: Medicare Other | Attending: Internal Medicine | Admitting: Internal Medicine

## 2021-04-15 ENCOUNTER — Emergency Department (HOSPITAL_COMMUNITY): Payer: Medicare Other

## 2021-04-15 DIAGNOSIS — M545 Low back pain, unspecified: Secondary | ICD-10-CM | POA: Diagnosis present

## 2021-04-15 DIAGNOSIS — Z882 Allergy status to sulfonamides status: Secondary | ICD-10-CM

## 2021-04-15 DIAGNOSIS — I10 Essential (primary) hypertension: Secondary | ICD-10-CM | POA: Diagnosis present

## 2021-04-15 DIAGNOSIS — G4733 Obstructive sleep apnea (adult) (pediatric): Secondary | ICD-10-CM | POA: Diagnosis present

## 2021-04-15 DIAGNOSIS — J440 Chronic obstructive pulmonary disease with acute lower respiratory infection: Secondary | ICD-10-CM | POA: Diagnosis present

## 2021-04-15 DIAGNOSIS — N1831 Chronic kidney disease, stage 3a: Secondary | ICD-10-CM | POA: Diagnosis present

## 2021-04-15 DIAGNOSIS — I248 Other forms of acute ischemic heart disease: Secondary | ICD-10-CM | POA: Diagnosis present

## 2021-04-15 DIAGNOSIS — Z86718 Personal history of other venous thrombosis and embolism: Secondary | ICD-10-CM

## 2021-04-15 DIAGNOSIS — I4819 Other persistent atrial fibrillation: Secondary | ICD-10-CM | POA: Diagnosis present

## 2021-04-15 DIAGNOSIS — Z9842 Cataract extraction status, left eye: Secondary | ICD-10-CM | POA: Diagnosis not present

## 2021-04-15 DIAGNOSIS — U071 COVID-19: Principal | ICD-10-CM | POA: Diagnosis present

## 2021-04-15 DIAGNOSIS — Z6841 Body Mass Index (BMI) 40.0 and over, adult: Secondary | ICD-10-CM

## 2021-04-15 DIAGNOSIS — E1159 Type 2 diabetes mellitus with other circulatory complications: Secondary | ICD-10-CM | POA: Diagnosis present

## 2021-04-15 DIAGNOSIS — I5020 Unspecified systolic (congestive) heart failure: Secondary | ICD-10-CM | POA: Diagnosis present

## 2021-04-15 DIAGNOSIS — I480 Paroxysmal atrial fibrillation: Secondary | ICD-10-CM | POA: Diagnosis present

## 2021-04-15 DIAGNOSIS — E871 Hypo-osmolality and hyponatremia: Secondary | ICD-10-CM | POA: Diagnosis present

## 2021-04-15 DIAGNOSIS — I472 Ventricular tachycardia, unspecified: Secondary | ICD-10-CM

## 2021-04-15 DIAGNOSIS — E860 Dehydration: Secondary | ICD-10-CM | POA: Diagnosis present

## 2021-04-15 DIAGNOSIS — R0902 Hypoxemia: Secondary | ICD-10-CM

## 2021-04-15 DIAGNOSIS — E785 Hyperlipidemia, unspecified: Secondary | ICD-10-CM | POA: Diagnosis present

## 2021-04-15 DIAGNOSIS — R001 Bradycardia, unspecified: Secondary | ICD-10-CM | POA: Diagnosis not present

## 2021-04-15 DIAGNOSIS — E1165 Type 2 diabetes mellitus with hyperglycemia: Secondary | ICD-10-CM

## 2021-04-15 DIAGNOSIS — J9601 Acute respiratory failure with hypoxia: Secondary | ICD-10-CM | POA: Diagnosis present

## 2021-04-15 DIAGNOSIS — I5033 Acute on chronic diastolic (congestive) heart failure: Secondary | ICD-10-CM | POA: Diagnosis present

## 2021-04-15 DIAGNOSIS — J9691 Respiratory failure, unspecified with hypoxia: Secondary | ICD-10-CM | POA: Diagnosis present

## 2021-04-15 DIAGNOSIS — Z961 Presence of intraocular lens: Secondary | ICD-10-CM | POA: Diagnosis present

## 2021-04-15 DIAGNOSIS — J449 Chronic obstructive pulmonary disease, unspecified: Secondary | ICD-10-CM | POA: Diagnosis present

## 2021-04-15 DIAGNOSIS — R778 Other specified abnormalities of plasma proteins: Secondary | ICD-10-CM | POA: Diagnosis present

## 2021-04-15 DIAGNOSIS — R0602 Shortness of breath: Secondary | ICD-10-CM

## 2021-04-15 DIAGNOSIS — R7989 Other specified abnormal findings of blood chemistry: Secondary | ICD-10-CM | POA: Diagnosis present

## 2021-04-15 DIAGNOSIS — I130 Hypertensive heart and chronic kidney disease with heart failure and stage 1 through stage 4 chronic kidney disease, or unspecified chronic kidney disease: Secondary | ICD-10-CM | POA: Diagnosis present

## 2021-04-15 DIAGNOSIS — J441 Chronic obstructive pulmonary disease with (acute) exacerbation: Secondary | ICD-10-CM | POA: Diagnosis present

## 2021-04-15 DIAGNOSIS — G8929 Other chronic pain: Secondary | ICD-10-CM | POA: Diagnosis present

## 2021-04-15 DIAGNOSIS — Z9841 Cataract extraction status, right eye: Secondary | ICD-10-CM | POA: Diagnosis not present

## 2021-04-15 DIAGNOSIS — Z87891 Personal history of nicotine dependence: Secondary | ICD-10-CM

## 2021-04-15 DIAGNOSIS — N183 Chronic kidney disease, stage 3 unspecified: Secondary | ICD-10-CM | POA: Diagnosis present

## 2021-04-15 DIAGNOSIS — Z79899 Other long term (current) drug therapy: Secondary | ICD-10-CM

## 2021-04-15 DIAGNOSIS — Z8249 Family history of ischemic heart disease and other diseases of the circulatory system: Secondary | ICD-10-CM

## 2021-04-15 DIAGNOSIS — Z888 Allergy status to other drugs, medicaments and biological substances status: Secondary | ICD-10-CM

## 2021-04-15 DIAGNOSIS — Z794 Long term (current) use of insulin: Secondary | ICD-10-CM | POA: Diagnosis not present

## 2021-04-15 DIAGNOSIS — J1282 Pneumonia due to coronavirus disease 2019: Secondary | ICD-10-CM | POA: Diagnosis present

## 2021-04-15 DIAGNOSIS — Z7901 Long term (current) use of anticoagulants: Secondary | ICD-10-CM | POA: Diagnosis not present

## 2021-04-15 DIAGNOSIS — E1122 Type 2 diabetes mellitus with diabetic chronic kidney disease: Secondary | ICD-10-CM | POA: Diagnosis present

## 2021-04-15 DIAGNOSIS — Z95810 Presence of automatic (implantable) cardiac defibrillator: Secondary | ICD-10-CM

## 2021-04-15 DIAGNOSIS — Z833 Family history of diabetes mellitus: Secondary | ICD-10-CM

## 2021-04-15 LAB — CBG MONITORING, ED
Glucose-Capillary: 167 mg/dL — ABNORMAL HIGH (ref 70–99)
Glucose-Capillary: 348 mg/dL — ABNORMAL HIGH (ref 70–99)

## 2021-04-15 LAB — CBC WITH DIFFERENTIAL/PLATELET
Abs Immature Granulocytes: 0.03 10*3/uL (ref 0.00–0.07)
Basophils Absolute: 0 10*3/uL (ref 0.0–0.1)
Basophils Relative: 0 %
Eosinophils Absolute: 0 10*3/uL (ref 0.0–0.5)
Eosinophils Relative: 0 %
HCT: 48.9 % — ABNORMAL HIGH (ref 36.0–46.0)
Hemoglobin: 16.2 g/dL — ABNORMAL HIGH (ref 12.0–15.0)
Immature Granulocytes: 0 %
Lymphocytes Relative: 32 %
Lymphs Abs: 2.8 10*3/uL (ref 0.7–4.0)
MCH: 31.3 pg (ref 26.0–34.0)
MCHC: 33.1 g/dL (ref 30.0–36.0)
MCV: 94.6 fL (ref 80.0–100.0)
Monocytes Absolute: 0.8 10*3/uL (ref 0.1–1.0)
Monocytes Relative: 10 %
Neutro Abs: 4.9 10*3/uL (ref 1.7–7.7)
Neutrophils Relative %: 58 %
Platelets: 128 10*3/uL — ABNORMAL LOW (ref 150–400)
RBC: 5.17 MIL/uL — ABNORMAL HIGH (ref 3.87–5.11)
RDW: 15.2 % (ref 11.5–15.5)
WBC: 8.6 10*3/uL (ref 4.0–10.5)
nRBC: 0 % (ref 0.0–0.2)

## 2021-04-15 LAB — BASIC METABOLIC PANEL
Anion gap: 13 (ref 5–15)
BUN: 17 mg/dL (ref 8–23)
CO2: 24 mmol/L (ref 22–32)
Calcium: 8.2 mg/dL — ABNORMAL LOW (ref 8.9–10.3)
Chloride: 96 mmol/L — ABNORMAL LOW (ref 98–111)
Creatinine, Ser: 1.34 mg/dL — ABNORMAL HIGH (ref 0.44–1.00)
GFR, Estimated: 40 mL/min — ABNORMAL LOW (ref >60)
Glucose, Bld: 172 mg/dL — ABNORMAL HIGH (ref 70–99)
Potassium: 3.9 mmol/L (ref 3.5–5.1)
Sodium: 133 mmol/L — ABNORMAL LOW (ref 135–145)

## 2021-04-15 LAB — RESP PANEL BY RT-PCR (FLU A&B, COVID) ARPGX2
Influenza A by PCR: NEGATIVE
Influenza B by PCR: NEGATIVE
SARS Coronavirus 2 by RT PCR: POSITIVE — AB

## 2021-04-15 LAB — FIBRINOGEN: Fibrinogen: 410 mg/dL (ref 210–475)

## 2021-04-15 LAB — FERRITIN: Ferritin: 82 ng/mL (ref 11–307)

## 2021-04-15 LAB — HEPATIC FUNCTION PANEL
ALT: 23 U/L (ref 0–44)
AST: 42 U/L — ABNORMAL HIGH (ref 15–41)
Albumin: 3 g/dL — ABNORMAL LOW (ref 3.5–5.0)
Alkaline Phosphatase: 44 U/L (ref 38–126)
Bilirubin, Direct: 0.2 mg/dL (ref 0.0–0.2)
Indirect Bilirubin: 0.6 mg/dL (ref 0.3–0.9)
Total Bilirubin: 0.8 mg/dL (ref 0.3–1.2)
Total Protein: 6.5 g/dL (ref 6.5–8.1)

## 2021-04-15 LAB — C-REACTIVE PROTEIN: CRP: 3.7 mg/dL — ABNORMAL HIGH (ref <1.0)

## 2021-04-15 LAB — HIV ANTIBODY (ROUTINE TESTING W REFLEX): HIV Screen 4th Generation wRfx: NONREACTIVE

## 2021-04-15 LAB — HEMOGLOBIN A1C
Hgb A1c MFr Bld: 9 % — ABNORMAL HIGH (ref 4.8–5.6)
Mean Plasma Glucose: 211.6 mg/dL

## 2021-04-15 LAB — D-DIMER, QUANTITATIVE: D-Dimer, Quant: 0.49 ug/mL-FEU (ref 0.00–0.50)

## 2021-04-15 LAB — MAGNESIUM: Magnesium: 2.3 mg/dL (ref 1.7–2.4)

## 2021-04-15 LAB — BRAIN NATRIURETIC PEPTIDE: B Natriuretic Peptide: 47.4 pg/mL (ref 0.0–100.0)

## 2021-04-15 LAB — TROPONIN I (HIGH SENSITIVITY)
Troponin I (High Sensitivity): 26 ng/L — ABNORMAL HIGH (ref <18)
Troponin I (High Sensitivity): 25 ng/L — ABNORMAL HIGH (ref <18)

## 2021-04-15 MED ORDER — INSULIN ASPART 100 UNIT/ML IJ SOLN: 0.0000 [IU] | Freq: Three times a day (TID) | INTRAMUSCULAR | Status: DC

## 2021-04-15 MED ORDER — APIXABAN 5 MG PO TABS
5.0000 mg | ORAL_TABLET | Freq: Two times a day (BID) | ORAL | Status: DC
  Administered 2021-04-15 – 2021-04-23 (×16): 5 mg via ORAL
  Filled 2021-04-15 (×16): qty 1

## 2021-04-15 MED ORDER — ACETAMINOPHEN 325 MG PO TABS
650.0000 mg | ORAL_TABLET | Freq: Once | ORAL | Status: DC
  Filled 2021-04-15: qty 2

## 2021-04-15 MED ORDER — ACETAMINOPHEN 325 MG PO TABS: 650.0000 mg | ORAL_TABLET | Freq: Four times a day (QID) | ORAL | Status: DC | PRN

## 2021-04-15 MED ORDER — IPRATROPIUM-ALBUTEROL 0.5-2.5 (3) MG/3ML IN SOLN
3.0000 mL | Freq: Four times a day (QID) | RESPIRATORY_TRACT | Status: DC
Start: 2021-04-15 — End: 2021-04-15
  Administered 2021-04-15: 3 mL via RESPIRATORY_TRACT
  Filled 2021-04-15: qty 3

## 2021-04-15 MED ORDER — SODIUM CHLORIDE 0.9 % IV SOLN: 100.0000 mg | Freq: Every day | INTRAVENOUS | Status: DC

## 2021-04-15 MED ORDER — APIXABAN 5 MG PO TABS
5.0000 mg | ORAL_TABLET | Freq: Once | ORAL | Status: AC
  Administered 2021-04-15: 5 mg via ORAL
  Filled 2021-04-15: qty 1

## 2021-04-15 MED ORDER — ICOSAPENT ETHYL 1 G PO CAPS
2.0000 g | ORAL_CAPSULE | Freq: Two times a day (BID) | ORAL | Status: DC
  Administered 2021-04-15 – 2021-04-23 (×15): 2 g via ORAL
  Filled 2021-04-15 (×18): qty 2

## 2021-04-15 MED ORDER — FUROSEMIDE 40 MG PO TABS
80.0000 mg | ORAL_TABLET | Freq: Two times a day (BID) | ORAL | Status: DC
  Administered 2021-04-15: 80 mg via ORAL
  Filled 2021-04-15: qty 4

## 2021-04-15 MED ORDER — AZITHROMYCIN 500 MG PO TABS: 500.0000 mg | ORAL_TABLET | Freq: Every day | ORAL | Status: DC

## 2021-04-15 MED ORDER — DEXAMETHASONE SODIUM PHOSPHATE 10 MG/ML IJ SOLN
6.0000 mg | INTRAMUSCULAR | Status: DC
Start: 2021-04-15 — End: 2021-04-19
  Administered 2021-04-15 – 2021-04-18 (×4): 6 mg via INTRAVENOUS
  Filled 2021-04-15 (×4): qty 1

## 2021-04-15 MED ORDER — SODIUM CHLORIDE 0.9 % IV SOLN
100.0000 mg | Freq: Every day | INTRAVENOUS | Status: AC
  Administered 2021-04-16 – 2021-04-19 (×4): 100 mg via INTRAVENOUS
  Filled 2021-04-15 (×4): qty 20

## 2021-04-15 MED ORDER — SACUBITRIL-VALSARTAN 49-51 MG PO TABS
1.0000 | ORAL_TABLET | Freq: Two times a day (BID) | ORAL | Status: DC
  Administered 2021-04-15 – 2021-04-16 (×3): 1 via ORAL
  Filled 2021-04-15 (×4): qty 1

## 2021-04-15 MED ORDER — INSULIN ASPART PROT & ASPART (70-30 MIX) 100 UNIT/ML ~~LOC~~ SUSP
30.0000 [IU] | Freq: Two times a day (BID) | SUBCUTANEOUS | Status: DC
  Filled 2021-04-15: qty 10

## 2021-04-15 MED ORDER — MELATONIN 5 MG PO TABS
5.0000 mg | ORAL_TABLET | Freq: Every day | ORAL | Status: DC
  Administered 2021-04-15 – 2021-04-22 (×8): 5 mg via ORAL
  Filled 2021-04-15 (×9): qty 1

## 2021-04-15 MED ORDER — SODIUM CHLORIDE 0.9 % IV SOLN: 250.0000 mL | INTRAVENOUS | Status: DC | PRN

## 2021-04-15 MED ORDER — SODIUM CHLORIDE 0.9 % IV SOLN
200.0000 mg | Freq: Once | INTRAVENOUS | Status: AC
  Administered 2021-04-15: 200 mg via INTRAVENOUS
  Filled 2021-04-15: qty 40

## 2021-04-15 MED ORDER — POTASSIUM CHLORIDE CRYS ER 20 MEQ PO TBCR
20.0000 meq | EXTENDED_RELEASE_TABLET | Freq: Two times a day (BID) | ORAL | Status: DC
  Administered 2021-04-15 – 2021-04-18 (×6): 20 meq via ORAL
  Filled 2021-04-15 (×6): qty 1

## 2021-04-15 MED ORDER — SODIUM CHLORIDE 0.9% FLUSH: 3.0000 mL | INTRAVENOUS | Status: DC | PRN

## 2021-04-15 MED ORDER — GUAIFENESIN-DM 100-10 MG/5ML PO SYRP: 10.0000 mL | ORAL_SOLUTION | ORAL | Status: DC | PRN

## 2021-04-15 MED ORDER — ASCORBIC ACID 500 MG PO TABS
500.0000 mg | ORAL_TABLET | Freq: Every day | ORAL | Status: DC
  Administered 2021-04-15 – 2021-04-23 (×9): 500 mg via ORAL
  Filled 2021-04-15 (×9): qty 1

## 2021-04-15 MED ORDER — DAPAGLIFLOZIN PROPANEDIOL 5 MG PO TABS
5.0000 mg | ORAL_TABLET | Freq: Every day | ORAL | Status: DC
  Administered 2021-04-16 – 2021-04-23 (×8): 5 mg via ORAL
  Filled 2021-04-15 (×8): qty 1

## 2021-04-15 MED ORDER — IPRATROPIUM-ALBUTEROL 0.5-2.5 (3) MG/3ML IN SOLN
3.0000 mL | Freq: Once | RESPIRATORY_TRACT | Status: AC
  Administered 2021-04-15: 3 mL via RESPIRATORY_TRACT
  Filled 2021-04-15: qty 3

## 2021-04-15 MED ORDER — SODIUM CHLORIDE 0.9 % IV SOLN: 200.0000 mg | Freq: Once | INTRAVENOUS | Status: DC

## 2021-04-15 MED ORDER — AZITHROMYCIN 500 MG IV SOLR
500.0000 mg | INTRAVENOUS | Status: AC
  Administered 2021-04-15: 500 mg via INTRAVENOUS
  Filled 2021-04-15: qty 500

## 2021-04-15 MED ORDER — ALBUTEROL SULFATE HFA 108 (90 BASE) MCG/ACT IN AERS
2.0000 | INHALATION_SPRAY | RESPIRATORY_TRACT | Status: DC | PRN
  Filled 2021-04-15: qty 6.7

## 2021-04-15 MED ORDER — IPRATROPIUM-ALBUTEROL 20-100 MCG/ACT IN AERS
1.0000 | INHALATION_SPRAY | Freq: Four times a day (QID) | RESPIRATORY_TRACT | Status: DC
  Administered 2021-04-16 – 2021-04-19 (×14): 1 via RESPIRATORY_TRACT
  Filled 2021-04-15 (×2): qty 4

## 2021-04-15 MED ORDER — ADULT MULTIVITAMIN W/MINERALS CH
1.0000 | ORAL_TABLET | Freq: Every day | ORAL | Status: DC
  Administered 2021-04-16 – 2021-04-23 (×8): 1 via ORAL
  Filled 2021-04-15 (×8): qty 1

## 2021-04-15 MED ORDER — SODIUM CHLORIDE 0.9% FLUSH
3.0000 mL | Freq: Two times a day (BID) | INTRAVENOUS | Status: DC
  Administered 2021-04-15 – 2021-04-23 (×17): 3 mL via INTRAVENOUS

## 2021-04-15 MED ORDER — METOPROLOL SUCCINATE ER 25 MG PO TB24
25.0000 mg | ORAL_TABLET | Freq: Every day | ORAL | Status: DC
  Administered 2021-04-15 – 2021-04-16 (×2): 25 mg via ORAL
  Filled 2021-04-15 (×3): qty 1

## 2021-04-15 MED ORDER — ZINC SULFATE 220 (50 ZN) MG PO CAPS
220.0000 mg | ORAL_CAPSULE | Freq: Every day | ORAL | Status: DC
  Administered 2021-04-15 – 2021-04-23 (×9): 220 mg via ORAL
  Filled 2021-04-15 (×9): qty 1

## 2021-04-15 MED ORDER — FENOFIBRATE 160 MG PO TABS
160.0000 mg | ORAL_TABLET | Freq: Every day | ORAL | Status: DC
  Administered 2021-04-15 – 2021-04-23 (×9): 160 mg via ORAL
  Filled 2021-04-15 (×9): qty 1

## 2021-04-15 MED ORDER — AMIODARONE HCL 200 MG PO TABS
200.0000 mg | ORAL_TABLET | Freq: Every day | ORAL | Status: DC
  Administered 2021-04-15 – 2021-04-23 (×9): 200 mg via ORAL
  Filled 2021-04-15 (×9): qty 1

## 2021-04-15 NOTE — ED Notes (Signed)
Pt uncomfortable in the stretcher, pt wants to sit up. Pt given recliner and assisted to get into recliner. Pt required minimal assistance to move from stretcher to recliner. Pt denies any complaints, O2 in place at 3L. No acute changes noted. Purewick in place but leaking, replaced and draining without issues. Will continue to monitor.

## 2021-04-15 NOTE — ED Notes (Signed)
Patient moved to appropriate seating area 

## 2021-04-15 NOTE — ED Notes (Signed)
Pt resting comfortably in recliner, denies any complaints. Respirations even and unlabored. O2 remains in place. No acute changes noted. Will continue to monitor.

## 2021-04-15 NOTE — ED Provider Notes (Addendum)
[facility] EMERGENCY DEPARTMENT Provider Note   CSN: [id number] Arrival date & time: 04/15/21  3474     History Chief Complaint  Patient presents with   Shortness of Breath    Heidi Stark is a 80 y.o. female.  HPI Patient reports she was at baseline health until 2 days ago.  She reports she started to feel little short of breath and started wearing her home oxygen.  Patient reports at baseline she does not continuously wear her oxygen.  She reports she does have history of COPD and CHF but does not routinely use any nebulizer solution or inhalers for COPD.  She reports she does use nighttime CPAP and sometimes nighttime oxygen.  She reports yesterday evening shortness of breath became abruptly much worse.  She reports she did start coughing and now has a cough is productive of some yellow sputum.  She reports she got significant relief when EMS arrived and gave her a breathing treatment.  Patient denies associated chest pain.  She reports at home she had not spiked a fever or gotten generalized body aches before becoming significantly more short of breath.  Denies abdominal pain nausea vomiting or diarrhea.    Past Medical History:  Diagnosis Date   A-fib [facility])    Arthritis    "hands, wrists" (11/07/2016)   CHF (congestive heart failure) (HCC)    Chronic lower back pain    Constipation    COPD (chronic obstructive pulmonary disease) ([place])    DVT (deep venous thrombosis) (HCC)    Dyspnea    History of blood transfusion 1990   "related to OR"   History of kidney stones    Hyperlipidemia    Hypertension    Joint pain    Lower extremity edema    OSA on CPAP    Persistent atrial fibrillation ([place])    Pneumonia    "several times" (11/07/2016)   Type II diabetes mellitus ([place])     Patient Active Problem List   Diagnosis Date Noted   COVID-19 04/15/2021   Respiratory failure with hypoxia ([place]) 04/15/2021   Acute on chronic systolic (congestive) heart  failure ([place]) 09/14/2020   Wide-complex tachycardia ([place]) 09/07/2020   Ventricular tachyarrhythmia ([place]) 09/07/2020   VT (ventricular tachycardia) ([place]) 09/07/2020   Persistent atrial fibrillation ([place]) 04/30/2018   Wound of left leg 10/01/2017   Cellulitis of right lower extremity 11/27/2016   Encounter for assessment for deep vein thrombosis (DVT) 11/14/2016   Hypokalemia 08/13/2016   Constipation 08/13/2016   Acute respiratory failure with hypoxia ([place]) 08/10/2016   Atrial fibrillation with rapid ventricular response ([place]) 08/10/2016   Type 2 diabetes mellitus with hyperglycemia, with long-term current use of insulin (HCC)    Atherosclerosis of aorta ([place]) 06/26/2016   Tobacco abuse 06/26/2016   Colitis 11/24/2014   Severe obesity (BMI >= 40) ([place]) 11/12/2013   PAF (paroxysmal atrial fibrillation) ([place]) 11/03/2013   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 10/28/2013   COLONIC POLYPS 08/16/2010   PAD (peripheral artery disease) ([place]) 08/15/2010   HLD (hyperlipidemia) 11/18/2009   MYALGIA 11/18/2009   OBESITY 05/14/2007   PULMONARY NODULE, RIGHT MIDDLE LOBE 05/14/2007   Type 2 diabetes mellitus with vascular disease ([place]) 03/20/2007   Obstructive sleep apnea 12/06/2006   Essential hypertension 12/06/2006   COPD (chronic obstructive pulmonary disease) ([place]) 12/06/2006    Past Surgical History:  Procedure Laterality Date   ATRIAL FIBRILLATION ABLATION N/A 11/07/2016   Procedure:  Atrial Fibrillation Ablation;  Surgeon: [name], MD;  Location: [place] CV LAB;  Service: Cardiovascular;  Laterality: N/A;   BACK SURGERY     CARDIAC CATHETERIZATION     CARDIOVERSION N/A 08/11/2016   Procedure: CARDIOVERSION;  Surgeon: Larey Dresser, MD;  Location: [place];  Service: Cardiovascular;  Laterality: N/A;   CARDIOVERSION N/A 12/03/2017   Procedure: CARDIOVERSION;  Surgeon: Larey Dresser, MD;  Location: [facility] ENDOSCOPY;  Service:  Cardiovascular;  Laterality: N/A;   CARDIOVERSION N/A 05/02/2018   Procedure: CARDIOVERSION;  Surgeon: Pixie Casino, MD;  Location: [facility] ENDOSCOPY;  Service: Cardiovascular;  Laterality: N/A;   CARDIOVERSION N/A 06/23/2020   Procedure: CARDIOVERSION;  Surgeon: [name], MD;  Location: [facility] ENDOSCOPY;  Service: Cardiovascular;  Laterality: N/A;   CARDIOVERSION N/A 10/11/2020   Procedure: CARDIOVERSION;  Surgeon: Larey Dresser, MD;  Location: [place];  Service: Cardiovascular;  Laterality: N/A;   CARPAL TUNNEL RELEASE     CATARACT EXTRACTION W/ INTRAOCULAR LENS  IMPLANT, BILATERAL Bilateral    COLON SURGERY  1990   vein graft and colon repair after nicked artery with back surgert   COLONOSCOPY     CORONARY ANGIOGRAPHY N/A 09/08/2020   Procedure: CORONARY ANGIOGRAPHY;  Surgeon: Larey Dresser, MD;  Location: [place] CV LAB;  Service: Cardiovascular;  Laterality: N/A;   CYSTECTOMY     between bladder and kidneys   GANGLION CYST EXCISION Left    ICD IMPLANT N/A 09/15/2020   Procedure: ICD IMPLANT;  Surgeon: Constance Haw, MD;  Location: [place] CV LAB;  Service: Cardiovascular;  Laterality: N/A;   KNEE CARTILAGE SURGERY Right 1960s   LUMBAR [place] ILIAC ARTERY  1990   vein graft and colon repair after nicked artery with back surgert   TEE WITHOUT CARDIOVERSION N/A 10/31/2013   Procedure: TRANSESOPHAGEAL ECHOCARDIOGRAM (TEE);  Surgeon: Larey Dresser, MD;  Location: [facility] ENDOSCOPY;  Service: Cardiovascular;  Laterality: N/A;   TUBAL LIGATION       OB History     Gravida  2   Para      Term      Preterm      AB      Living         SAB      IAB      Ectopic      Multiple      Live Births              Family History  Problem Relation Age of Onset   Diabetes Mother    Hypertension Mother    Cancer Mother    Obesity Mother    Diabetes Father    Obesity Father    Diabetes Other    Melanoma Other    Factor V  Leiden deficiency Daughter     Social History   Tobacco Use   Smoking status: Former    Packs/day: 1.00    Years: 50.00    Pack years: 50.00    Types: Cigarettes   Smokeless tobacco: Never  Vaping Use   Vaping Use: Never used  Substance Use Topics   Alcohol use: No    Alcohol/week: 0.0 standard drinks   Drug use: No    Home Medications Prior to Admission medications   Medication Sig Start Date End Date Taking? Authorizing Provider  acetaminophen (TYLENOL) 500 MG tablet Take 500 mg by mouth every 6 (six) hours as needed for moderate  pain or headache.    [provider]  amiodarone (PACERONE) 200 MG tablet Take 200 mg by mouth daily.    [provider]  apixaban (ELIQUIS) 5 MG TABS tablet Take 1 tablet (5 mg total) by mouth 2 (two) times daily. 09/16/20   Lyda Jester M, PA-C  eplerenone (INSPRA) 25 MG tablet TAKE 1 TABLET (25 MG TOTAL) BY MOUTH DAILY. 03/03/21   Larey Dresser, MD  FARXIGA 10 MG TABS tablet TAKE 1 TABLET BY MOUTH DAILY BEFORE BREAKFAST. 03/09/21   Larey Dresser, MD  fenofibrate (TRICOR) 145 MG tablet TAKE 1 TABLET BY MOUTH EVERY DAY 11/25/20   Larey Dresser, MD  furosemide (LASIX) 40 MG tablet Take 2 tablets (80 mg total) by mouth 2 (two) times daily. 12/02/20   Larey Dresser, MD  icosapent Ethyl (VASCEPA) 1 g capsule TAKE 2 CAPSULES BY MOUTH TWICE A DAY 02/25/21   Larey Dresser, MD  insulin glargine (LANTUS) 100 UNIT/ML injection Inject 15 Units into the skin at bedtime.  Patient not taking: Reported on 01/27/2021    [provider]  LIVALO 4 MG TABS Take 1 tablet by mouth daily.    [provider]  metoprolol succinate (TOPROL-XL) 25 MG 24 hr tablet TAKE 1 TABLET BY MOUTH EVERYDAY AT BEDTIME 11/26/20   Larey Dresser, MD  Multiple Vitamin (MULTIVITAMIN WITH MINERALS) TABS tablet Take 1 tablet by mouth daily.    [provider]  potassium chloride (KLOR-CON M10) 10 MEQ tablet Take 2 tablets (20 mEq total)  by mouth 2 (two) times daily. 09/17/20   Lyda Jester M, PA-C  sacubitril-valsartan (ENTRESTO) 49-51 MG Take 1 tablet by mouth 2 (two) times daily. 12/09/20   Larey Dresser, MD    Allergies    Neomycin-bacitracin zn-polymyx, Sulfonamide derivatives, Ciprofloxacin, and Spironolactone  Review of Systems   Review of Systems 10 systems reviewed and negative except as per HPI Physical Exam Updated Vital Signs BP 111/64 (BP Location: Right Arm)   Pulse 66   Temp 100.3 F (37.9 C)   Resp 20   Ht 5\' 6"  (1.676 m)   Wt 113.4 kg   SpO2 93%   BMI 40.35 kg/m   Physical Exam Constitutional:      Comments: Alert, nontoxic, mild increased work of breathing at rest.  HENT:     Mouth/Throat:     Pharynx: Oropharynx is clear.  Eyes:     Extraocular Movements: Extraocular movements intact.  Cardiovascular:     Rate and Rhythm: Normal rate and regular rhythm.  Pulmonary:     Comments: Mild increased work of breathing at rest.  Speaking in full sentences.  Expiratory wheeze bilateral lung fields Abdominal:     General: There is no distension.     Palpations: Abdomen is soft.     Tenderness: There is no abdominal tenderness. There is no guarding.  Musculoskeletal:        General: Normal range of motion.     Comments: No peripheral edema.  Calves nontender.  Skin:    General: Skin is warm and dry.  Neurological:     General: No focal deficit present.     Mental Status: She is oriented to person, place, and time.     Coordination: Coordination normal.  Psychiatric:        Mood and Affect: Mood normal.    ED Results / Procedures / Treatments   Labs (all labs ordered are listed, but only abnormal results  are displayed) Labs Reviewed  RESP PANEL BY RT-PCR (FLU A&B, COVID) ARPGX2 - Abnormal; Notable for the following components:      Result Value   SARS Coronavirus 2 by RT PCR POSITIVE (*)    All other components within normal limits  CBC WITH DIFFERENTIAL/PLATELET - Abnormal;  Notable for the following components:   RBC 5.17 (*)    Hemoglobin 16.2 (*)    HCT 48.9 (*)    Platelets 128 (*)    All other components within normal limits  BASIC METABOLIC PANEL - Abnormal; Notable for the following components:   Sodium 133 (*)    Chloride 96 (*)    Glucose, Bld 172 (*)    Creatinine, Ser 1.34 (*)    Calcium 8.2 (*)    GFR, Estimated 40 (*)    All other components within normal limits  CBG MONITORING, ED - Abnormal; Notable for the following components:   Glucose-Capillary 167 (*)    All other components within normal limits  TROPONIN I (HIGH SENSITIVITY) - Abnormal; Notable for the following components:   Troponin I (High Sensitivity) 26 (*)    All other components within normal limits  BRAIN NATRIURETIC PEPTIDE  TROPONIN I (HIGH SENSITIVITY)    EKG EKG Interpretation  Date/Time:  Friday April 15 2021 02:27:27 EDT Ventricular Rate:  76 PR Interval:  232 QRS Duration: 148 QT Interval:  440 QTC Calculation: 495 R Axis:   112 Text Interpretation: Sinus rhythm with 1st degree A-V block Right bundle branch block , new from prior Abnormal ECG Confirmed by Addison Lank [phone]) on 04/15/2021 3:24:06 AM  Radiology DG Chest 2 View  Result Date: 04/15/2021 CLINICAL DATA:  Shortness of breath x2 days. EXAM: CHEST - 2 VIEW COMPARISON:  September 16, 2020 FINDINGS: A single lead ventricular pacer is noted. Mild, diffuse, chronic appearing increased interstitial lung markings are noted. There is no evidence of acute infiltrate, pleural effusion or pneumothorax. The cardiac silhouette is moderately enlarged and unchanged in size. The visualized skeletal structures are unremarkable. IMPRESSION: Stable cardiomegaly with chronic appearing increased interstitial lung markings. Electronically Signed   By: Virgina Norfolk M.D.   On: 04/15/2021 03:12    Procedures Procedures  CRITICAL CARE Performed by: Charlesetta Shanks   Total critical care time: 35 minutes  Critical  care time was exclusive of separately billable procedures and treating other patients.  Critical care was necessary to treat or prevent imminent or life-threatening deterioration.  Critical care was time spent personally by me on the following activities: development of treatment plan with patient and/or surrogate as well as nursing, discussions with consultants, evaluation of patient's response to treatment, examination of patient, obtaining history from patient or surrogate, ordering and performing treatments and interventions, ordering and review of laboratory studies, ordering and review of radiographic studies, pulse oximetry and re-evaluation of patient's condition.  Medications Ordered in ED Medications  acetaminophen (TYLENOL) tablet 650 mg (650 mg Oral Not Given 04/15/21 1107)  amiodarone (PACERONE) tablet 200 mg (200 mg Oral Given 04/15/21 1104)  metoprolol succinate (TOPROL-XL) 24 hr tablet 25 mg (25 mg Oral Given 04/15/21 1104)  remdesivir 200 mg in sodium chloride 0.9% 250 mL IVPB (has no administration in time range)    Followed by  remdesivir 100 mg in sodium chloride 0.9 % 100 mL IVPB (has no administration in time range)  apixaban (ELIQUIS) tablet 5 mg (5 mg Oral Given 04/15/21 1105)  ipratropium-albuterol (DUONEB) 0.5-2.5 (3) MG/3ML nebulizer solution 3 mL (3 mLs  Nebulization Given 04/15/21 1231)    ED Course  I have reviewed the triage vital signs and the nursing notes.  Pertinent labs & imaging results that were available during my care of the patient were reviewed by me and considered in my medical decision making (see chart for details).  Clinical Course as of 04/15/21 1338  Fri Apr 15, 2021  1338 Consult: Dr. Eliberto Ivory for admission [facility] hospitalist [MP]    Clinical Course User Index [MP] Charlesetta Shanks, MD   MDM Rules/Calculators/A&P                          Patient has significant history of cardiac disease with atrial fibrillation chronically anticoagulated on  Eliquis and treated with Pacerone and metoprolol.  Patient also reports history of COPD but is not on chronic maintenance or chronically on O2.  She experienced worsening shortness of breath for 2 days and then much worse yesterday evening.  Patient does test positive for COVID.  No consolidations or significant infiltrates on chest x-ray.  Patient does have wheezing consistent with COPD exacerbation likely due to new diagnosis of COVID.  Troponin is not significantly elevated.  At this time hypoxia and symptoms appear to be due to COVID and COPD without acute cardiac decompensation.  Patient has increased oxygen requirement.  She is on 2 L oxygen which is only used at home as needed.  At this time and saturation has dropped to about 90% on 2 L.  Plan for admission for COVID with acute hypoxic respiratory failure and severe comorbid illness.  Patient's mental status remains clear.  She is protecting her airway is responding positively to nebulizer treatments.  Final Clinical Impression(s) / ED Diagnoses Final diagnoses:  COVID-19  COPD exacerbation ([place])  Hypoxia    Rx / DC Orders ED Discharge Orders     None        Charlesetta Shanks, MD 04/15/21 1313    Charlesetta Shanks, MD 04/15/21 1339

## 2021-04-15 NOTE — ED Provider Notes (Addendum)
Emergency Medicine Provider Triage Evaluation Note  Heidi Stark , a 80 y.o. female  was evaluated in triage.  Pt complains of shortness of breath x2 days.  She has a history of COPD and wears 2 L [place] intermittently as needed at home.  Patient notes increasing chest tightness but no pain.  No fevers or chills.  Has audible wheezing on arrival to the ER.  Denies any leg swelling.  Review of Systems  Positive: As above Negative: As above   Physical Exam  BP (!) 109/56 (BP Location: Left Arm)   Pulse 74   Temp 99.4 F (37.4 C) (Oral)   Resp (!) 22   Ht 5\' 6"  (1.676 m)   Wt 113.4 kg   SpO2 97%   BMI 40.35 kg/m  Gen:   Awake, no distress   Resp:  Normal effort  MSK:   Moves extremities without difficulty  Other:  Audible wheezes noted with scattered rhonchi throughout all 4 lung fields.  No lower extremity edema.  Patient speaking in full sentences.  On 4 L [place]  Medical Decision Making  Medically screening exam initiated at 2:23 AM.  Appropriate orders placed.  Heidi Stark was informed that the remainder of the evaluation will be completed by another provider, this initial triage assessment does not replace that evaluation, and the importance of remaining in the ED until their evaluation is complete.      Garald Balding, PA-C 04/15/21 [place], [place], DO 04/15/21 [phone]

## 2021-04-15 NOTE — ED Triage Notes (Signed)
PER EMS: pt from home with c/o SOB x 2 days. She wears O2 at home as needed and has been wearing 2L [place] at home x 2 days. EMS adm 5 mg of albuterol on scene. Denies pain.   20g l hand 139/65, 96% 4l [place], HR-69, CBG 153

## 2021-04-15 NOTE — ED Notes (Signed)
Placed pt on room air, o2 saturation was between 91 and 93%. MD Pfeiffer notified. And pt placed back onto 2L.

## 2021-04-15 NOTE — H&P (Signed)
History and Physical    Heidi Stark HYW:[id number] DOB: [birth year] DOA: 04/15/2021  PCP: Ann Held, DO Consultants:  cardiology: Dr. [name], EP: Dr. [name], endo: Dr. [name], ophthalmology: Dr. Ellie Lunch Patient coming from:  Home - lives with her husband   Chief Complaint: shortness of breath   HPI: Heidi Stark is a 80 y.o. 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 She states she first noticed her shortness of breath in the AM. She had this at rest, but was worse with exertion. She does not wear any oxygen at home. She would have to sit down and rest, but would stay short of breath and breathing hard. She has had no fever/chills. She has had a cough with increased sputum production that is yellow in color. Denies any wheezing, but has had a tightness in chest.  She denies any headaches, vision changes, chest pain, palpitations, stomach, N/V/D, leg swelling, weight gain or rashes.  Denies any sick contacts. Did not take any otc medication yesterday.  States she has gotten a lot of relief with breathing treatment given via EMS and in ED. Was wheezing with chest tightness in triage in ED  She has no vaccines for covid or flu.    ED Course: vitals: Temperature 99.4, blood pressure 109/56, heart rate 74, respiratory rate 22, oxygen 97% on 4 L via nasal cannula Pertinent labs: Positive for COVID-19, hemoglobin 16.2, platelets 128, creatinine 1.34 (1.2-1.4), initial troponin 26 delta pending, BNP 47 Chest x-ray: Stable cardiomegaly with chronic appearing increased interstitial lung markings no evidence of acute infiltrate effusion or  pneumothorax.  has a single-lead ventricular pacer. In ED given Tylenol, DuoNeb and started on remdesivir.  Also given home dose of her metoprolol, amiodarone, and Eliquis.  TRH was called and asked to admit.  Review of Systems: As per HPI; otherwise review of systems reviewed and negative.   Ambulatory Status:  Ambulates with cane at times.   Past Medical History:  Diagnosis Date   A-fib [facility])    Arthritis    "hands, wrists" (11/07/2016)   CHF (congestive heart failure) (HCC)    Chronic lower back pain    Constipation    COPD (chronic obstructive pulmonary disease) ([place])    DVT (deep venous thrombosis) (HCC)    Dyspnea    History of blood transfusion 1990   "related to OR"   History of kidney stones    Hyperlipidemia    Hypertension    Joint pain    Lower extremity edema    OSA on CPAP    Persistent atrial fibrillation ([place])    Pneumonia    "several times" (11/07/2016)   Type II diabetes mellitus ([place])     Past Surgical History:  Procedure Laterality Date   ATRIAL FIBRILLATION ABLATION N/A 11/07/2016   Procedure: Atrial Fibrillation Ablation;  Surgeon: [name], MD;  Location: [place] CV LAB;  Service: Cardiovascular;  Laterality: N/A;   BACK SURGERY     CARDIAC CATHETERIZATION     CARDIOVERSION N/A 08/11/2016   Procedure: CARDIOVERSION;  Surgeon: Larey Dresser, MD;  Location: [place];  Service: Cardiovascular;  Laterality: N/A;   CARDIOVERSION N/A  12/03/2017   Procedure: CARDIOVERSION;  Surgeon: Larey Dresser, MD;  Location: [facility] ENDOSCOPY;  Service: Cardiovascular;  Laterality: N/A;   CARDIOVERSION N/A 05/02/2018   Procedure: CARDIOVERSION;  Surgeon: Pixie Casino, MD;  Location: [facility] ENDOSCOPY;  Service: Cardiovascular;  Laterality: N/A;   CARDIOVERSION N/A 06/23/2020   Procedure: CARDIOVERSION;  Surgeon: [name], MD;  Location: [facility] ENDOSCOPY;  Service: Cardiovascular;  Laterality: N/A;   CARDIOVERSION N/A 10/11/2020   Procedure:  CARDIOVERSION;  Surgeon: Larey Dresser, MD;  Location: [facility] ENDOSCOPY;  Service: Cardiovascular;  Laterality: N/A;   CARPAL TUNNEL RELEASE     CATARACT EXTRACTION W/ INTRAOCULAR LENS  IMPLANT, BILATERAL Bilateral    COLON SURGERY  1990   vein graft and colon repair after nicked artery with back surgert   COLONOSCOPY     CORONARY ANGIOGRAPHY N/A 09/08/2020   Procedure: CORONARY ANGIOGRAPHY;  Surgeon: Larey Dresser, MD;  Location: [place] CV LAB;  Service: Cardiovascular;  Laterality: N/A;   CYSTECTOMY     between bladder and kidneys   GANGLION CYST EXCISION Left    ICD IMPLANT N/A 09/15/2020   Procedure: ICD IMPLANT;  Surgeon: Constance Haw, MD;  Location: [place] CV LAB;  Service: Cardiovascular;  Laterality: N/A;   KNEE CARTILAGE SURGERY Right 1960s   LUMBAR [place] ILIAC ARTERY  1990   vein graft and colon repair after nicked artery with back surgert   TEE WITHOUT CARDIOVERSION N/A 10/31/2013   Procedure: TRANSESOPHAGEAL ECHOCARDIOGRAM (TEE);  Surgeon: Larey Dresser, MD;  Location: [facility] ENDOSCOPY;  Service: Cardiovascular;  Laterality: N/A;   TUBAL LIGATION      Social History   Socioeconomic History   Marital status: Married    Spouse name: Charles   Number of children: 2   Years of education: Not on file   Highest education level: Not on file  Occupational History   Occupation: Retired  Tobacco Use   Smoking status: Former    Packs/day: 1.00    Years: 50.00    Pack years: 50.00    Types: Cigarettes   Smokeless tobacco: Never  Vaping Use   Vaping Use: Never used  Substance and Sexual Activity   Alcohol use: No    Alcohol/week: 0.0 standard drinks   Drug use: No   Sexual activity: Not on file  Other Topics Concern   Not on file  Social History Narrative   Not on file   Social Determinants of Health   Financial Resource Strain: Low Risk    Difficulty of Paying Living Expenses: Not hard at all  Food Insecurity: No Food  Insecurity   Worried About Charity fundraiser in the Last Year: Never true   [place] in the Last Year: Never true  Transportation Needs: No Transportation Needs   Lack of Transportation (Medical): No   Lack of Transportation (Non-Medical): No  Physical Activity: Inactive   Days of Exercise per Week: 0 days   Minutes of Exercise per Session: 0 min  Stress: Not on file  Social Connections: Not on file  Intimate Partner Violence: Not on file    Allergies  Allergen Reactions   Neomycin-Bacitracin Zn-Polymyx Rash   Sulfonamide Derivatives Other (See Comments)    Stomach cramps   Ciprofloxacin Hives, Itching and Rash   Spironolactone Rash    Family History  Problem Relation Age of Onset   Diabetes Mother    Hypertension Mother  Cancer Mother    Obesity Mother    Diabetes Father    Obesity Father    Diabetes Other    Melanoma Other    Factor V Leiden deficiency Daughter     Prior to Admission medications   Medication Sig Start Date End Date Taking? Authorizing Provider  acetaminophen (TYLENOL) 500 MG tablet Take 500 mg by mouth every 6 (six) hours as needed for moderate pain or headache.    [provider]  amiodarone (PACERONE) 200 MG tablet Take 200 mg by mouth daily.    [provider]  apixaban (ELIQUIS) 5 MG TABS tablet Take 1 tablet (5 mg total) by mouth 2 (two) times daily. 09/16/20   Lyda Jester M, PA-C  eplerenone (INSPRA) 25 MG tablet TAKE 1 TABLET (25 MG TOTAL) BY MOUTH DAILY. 03/03/21   Larey Dresser, MD  FARXIGA 10 MG TABS tablet TAKE 1 TABLET BY MOUTH DAILY BEFORE BREAKFAST. 03/09/21   Larey Dresser, MD  fenofibrate (TRICOR) 145 MG tablet TAKE 1 TABLET BY MOUTH EVERY DAY 11/25/20   Larey Dresser, MD  furosemide (LASIX) 40 MG tablet Take 2 tablets (80 mg total) by mouth 2 (two) times daily. 12/02/20   Larey Dresser, MD  icosapent Ethyl (VASCEPA) 1 g capsule TAKE 2 CAPSULES BY MOUTH TWICE A DAY 02/25/21   Larey Dresser, MD   insulin glargine (LANTUS) 100 UNIT/ML injection Inject 15 Units into the skin at bedtime.  Patient not taking: Reported on 01/27/2021    [provider]  LIVALO 4 MG TABS Take 1 tablet by mouth daily.    [provider]  metoprolol succinate (TOPROL-XL) 25 MG 24 hr tablet TAKE 1 TABLET BY MOUTH EVERYDAY AT BEDTIME 11/26/20   Larey Dresser, MD  Multiple Vitamin (MULTIVITAMIN WITH MINERALS) TABS tablet Take 1 tablet by mouth daily.    [provider]  potassium chloride (KLOR-CON M10) 10 MEQ tablet Take 2 tablets (20 mEq total) by mouth 2 (two) times daily. 09/17/20   Lyda Jester M, PA-C  sacubitril-valsartan (ENTRESTO) 49-51 MG Take 1 tablet by mouth 2 (two) times daily. 12/09/20   Larey Dresser, MD    Physical Exam: Vitals:   04/15/21 1127 04/15/21 1232 04/15/21 1337 04/15/21 1339  BP: (!) 118/59 111/64  134/72  Pulse: 70 66 69 68  Resp: 18 20 20 18   Temp:      TempSrc:      SpO2: 91% 93% 91% 91%  Weight:      Height:         General:  Appears calm and comfortable and is in NAD. Mildly dyspneic with talking Eyes:  PERRL, EOMI, normal lids, iris ENT:  grossly normal hearing, lips & tongue, mmm; dentures on top  Neck:  no LAD, masses or thyromegaly; no carotid bruits Cardiovascular:  RRR, no m/r/g. No LE edema.  Respiratory:     Mild dyspnea with talking, but can talk in full sentences. No wheezing, scattered rhonchi, no crackles.  Abdomen:  soft, NT, ND, NABS Back:   normal alignment, no CVAT Skin:  no rash or induration seen on limited exam Musculoskeletal:  grossly normal tone BUE/BLE, good ROM, no bony abnormality Lower extremity:  No LE edema.  Limited foot exam with no ulcerations.  thready distal pulses. Psychiatric:  grossly normal mood and affect, speech fluent and appropriate, AOx3 Neurologic:  CN 2-12 grossly intact, moves all extremities in coordinated fashion, sensation intact    Radiological Exams  on Admission: Independently  reviewed - see discussion in A/P where applicable  DG Chest 2 View  Result Date: 04/15/2021 CLINICAL DATA:  Shortness of breath x2 days. EXAM: CHEST - 2 VIEW COMPARISON:  September 16, 2020 FINDINGS: A single lead ventricular pacer is noted. Mild, diffuse, chronic appearing increased interstitial lung markings are noted. There is no evidence of acute infiltrate, pleural effusion or pneumothorax. The cardiac silhouette is moderately enlarged and unchanged in size. The visualized skeletal structures are unremarkable. IMPRESSION: Stable cardiomegaly with chronic appearing increased interstitial lung markings. Electronically Signed   By: Virgina Norfolk M.D.   On: 04/15/2021 03:12    EKG: Independently reviewed.NSR with rate 76; nonspecific ST changes with no evidence of acute ischemia   Labs on Admission: I have personally reviewed the available labs and imaging studies at the time of the admission.  Pertinent labs:  Positive for COVID-19,  hemoglobin 16.2,  platelets 128,  creatinine 1.34 (1.2-1.4),  Initial troponin 26 delta pending,  BNP 47  Assessment/Plan Principal Problem:   COVID-81 80 year old female presenting with acute shortness of breath and respiratory failure requiring oxygen found to be positive for COVID-19.   -Airborne and contact precautions -Check COVID labs and follow daily -Start nutraceutical bundle -Start daily steroids due to need for oxygen -Antiviral: received dose of remdesivir in ED and discussed with family this is a EUA drug for covid. Benefits and risks, they would like to continue.  Discussed if any worsening respiratory failure requiring high flow oxygen, bipap, etc. Would need to consent for immunomodulator drug. They asked for drugs to research these, names given. They will follow up with consent with day team.  -IS/flutter valve -duoneb scheduled x 4 and SABA prn -Respiratory panel pending  Active Problems:   Respiratory failure with hypoxia  (HCC) -Likely combo of COVID-19 infection and COPD exacerbation -Requiring 2 L of oxygen via nasal cannula.  will treat COPD and COVID and wean oxygen as tolerated.    COPD (chronic obstructive pulmonary disease) exacerbation -Had wheezing on initial exam with good response to breathing treatment, increased dyspnea and increased sputum production likely has COPD exacerbation in setting of COVID-19 -will continue steroids for COPD exacerbation as well as COVID -Scheduled DuoNebs and as needed S ABA -I-S and flutter valve at bedside -Sputum culture pending -She has 2 out of 3 cardinal symptoms and will start her on azithromycin  Mildly elevated troponin -no chest pain and no changes in ekg -troponins flat -likely demand ischemia in setting of acute respiratory failure.     Essential hypertension -Well-controlled.  Continue metoprolol and Entresto    PAF (paroxysmal atrial fibrillation) (HCC) -Rate controlled and in nsr.   Will be admitted to telemetry -will continue home Eliquis and metoprolol and amiodarone    Type 2 diabetes mellitus with hyperglycemia, with long-term current use of insulin (HCC) -Appears to be poorly controlled A1c 5 months ago was 8.9 repeat pending - will continue her mixed insulin. May need adjustments on steroids.   -Sliding scale insulin and Accu-Cheks per protocol    Systolic CHF ([place]) -Appears euvolemic on exam with normal BNP and no findings of volume overload on chest x-ray.  Do Not think she is in any CHF exacerbation contributing to her acute respiratory failure. -Last echo in February 2022: Showed an EF of 50 to 55%.  Left ventricular diastolic parameters indeterminate.  Small pericardial effusion present. -will monitor intake and output and daily weights -Continue medical management with farxiga,  Lasix, metoprolol  and Entresto -eplerenone held as not on formulary and she has allergy to spironolactone.     CKD (chronic kidney disease) stage 3, GFR  30-59 ml/min (HCC) -Appears to be at her baseline -will continue all home meds for now and monitor BMP daily -Intake and output    HLD (hyperlipidemia) -Check liver panel watch liver enzymes as typically will rise with COVID - hold livalo, not on formulary  -Recent lipid panel in July 2022 showed an LDL of 94, total cholesterol of 186, HDL 34, and triglycerides of 292    Obstructive sleep apnea Continue CPAP qhs.     Hx of VT (ventricular tachycardia) ([place])  -telemetry -ICD 09/2020   Body mass index is 40.35 kg/m.    Level of care: Telemetry Medical DVT prophylaxis:  eliquis  Code Status:  Full - confirmed with patient Family Communication: daughter at bedside renee mansir Disposition Plan:  The patient is from: home  Anticipated d/c is to: home  Requires inpatient hospitalization and is at significant risk of worsening, requires constant monitoring, assessment and MDM with specialists.  Patient is currently: acutely ill Consults called: none   Admission status:  inpatient   Dragon dictation used in completing this note.    Orma Flaming MD [facility]   How to contact the [facility] Attending or Consulting provider [place] or covering provider during after hours [place], for this patient?  Check the care team in [facility] and look for a) attending/consulting TRH provider listed and b) the [facility] team listed Log into [email or web address] and use [place]'s universal password to access. If you do not have the password, please contact the hospital operator. Locate the [facility] provider you are looking for under [facility] and page to a number that you can be directly reached. If you still have difficulty reaching the provider, please page the [facility] (Director on Call) for the Hospitalists listed on amion for assistance.   04/15/2021, 1:47 PM

## 2021-04-16 ENCOUNTER — Inpatient Hospital Stay (HOSPITAL_COMMUNITY): Payer: Medicare Other

## 2021-04-16 ENCOUNTER — Other Ambulatory Visit: Payer: Self-pay

## 2021-04-16 DIAGNOSIS — U071 COVID-19: Secondary | ICD-10-CM | POA: Diagnosis not present

## 2021-04-16 LAB — COMPREHENSIVE METABOLIC PANEL
ALT: 26 U/L (ref 0–44)
AST: 41 U/L (ref 15–41)
Albumin: 3 g/dL — ABNORMAL LOW (ref 3.5–5.0)
Alkaline Phosphatase: 52 U/L (ref 38–126)
Anion gap: 12 (ref 5–15)
BUN: 26 mg/dL — ABNORMAL HIGH (ref 8–23)
CO2: 23 mmol/L (ref 22–32)
Calcium: 8.2 mg/dL — ABNORMAL LOW (ref 8.9–10.3)
Chloride: 96 mmol/L — ABNORMAL LOW (ref 98–111)
Creatinine, Ser: 1.33 mg/dL — ABNORMAL HIGH (ref 0.44–1.00)
GFR, Estimated: 41 mL/min — ABNORMAL LOW (ref >60)
Glucose, Bld: 325 mg/dL — ABNORMAL HIGH (ref 70–99)
Potassium: 4.2 mmol/L (ref 3.5–5.1)
Sodium: 131 mmol/L — ABNORMAL LOW (ref 135–145)
Total Bilirubin: 0.6 mg/dL (ref 0.3–1.2)
Total Protein: 7 g/dL (ref 6.5–8.1)

## 2021-04-16 LAB — CBC WITH DIFFERENTIAL/PLATELET
Abs Immature Granulocytes: 0.02 10*3/uL (ref 0.00–0.07)
Basophils Absolute: 0 10*3/uL (ref 0.0–0.1)
Basophils Relative: 0 %
Eosinophils Absolute: 0 10*3/uL (ref 0.0–0.5)
Eosinophils Relative: 0 %
HCT: 46.5 % — ABNORMAL HIGH (ref 36.0–46.0)
Hemoglobin: 15.7 g/dL — ABNORMAL HIGH (ref 12.0–15.0)
Immature Granulocytes: 0 %
Lymphocytes Relative: 27 %
Lymphs Abs: 1.3 10*3/uL (ref 0.7–4.0)
MCH: 31.3 pg (ref 26.0–34.0)
MCHC: 33.8 g/dL (ref 30.0–36.0)
MCV: 92.8 fL (ref 80.0–100.0)
Monocytes Absolute: 0.2 10*3/uL (ref 0.1–1.0)
Monocytes Relative: 3 %
Neutro Abs: 3.3 10*3/uL (ref 1.7–7.7)
Neutrophils Relative %: 70 %
Platelets: 117 10*3/uL — ABNORMAL LOW (ref 150–400)
RBC: 5.01 MIL/uL (ref 3.87–5.11)
RDW: 15.2 % (ref 11.5–15.5)
WBC: 4.8 10*3/uL (ref 4.0–10.5)
nRBC: 0 % (ref 0.0–0.2)

## 2021-04-16 LAB — CREATININE, URINE, RANDOM: Creatinine, Urine: 68.37 mg/dL

## 2021-04-16 LAB — C-REACTIVE PROTEIN: CRP: 4.2 mg/dL — ABNORMAL HIGH (ref <1.0)

## 2021-04-16 LAB — SODIUM, URINE, RANDOM: Sodium, Ur: 16 mmol/L

## 2021-04-16 LAB — PROCALCITONIN: Procalcitonin: <0.1 ng/mL

## 2021-04-16 LAB — GLUCOSE, CAPILLARY
Glucose-Capillary: 153 mg/dL — ABNORMAL HIGH (ref 70–99)
Glucose-Capillary: 291 mg/dL — ABNORMAL HIGH (ref 70–99)

## 2021-04-16 LAB — FERRITIN: Ferritin: 78 ng/mL (ref 11–307)

## 2021-04-16 LAB — BRAIN NATRIURETIC PEPTIDE: B Natriuretic Peptide: 72 pg/mL (ref 0.0–100.0)

## 2021-04-16 LAB — URIC ACID: Uric Acid, Serum: 7.4 mg/dL — ABNORMAL HIGH (ref 2.5–7.1)

## 2021-04-16 LAB — OSMOLALITY, URINE: Osmolality, Ur: 383 mOsm/kg (ref 300–900)

## 2021-04-16 LAB — OSMOLALITY: Osmolality: 298 mOsm/kg — ABNORMAL HIGH (ref 275–295)

## 2021-04-16 LAB — PHOSPHORUS: Phosphorus: 5.5 mg/dL — ABNORMAL HIGH (ref 2.5–4.6)

## 2021-04-16 LAB — MAGNESIUM: Magnesium: 2.3 mg/dL (ref 1.7–2.4)

## 2021-04-16 LAB — D-DIMER, QUANTITATIVE: D-Dimer, Quant: 0.51 ug/mL-FEU — ABNORMAL HIGH (ref 0.00–0.50)

## 2021-04-16 MED ORDER — LACTATED RINGERS IV SOLN: INTRAVENOUS | Status: AC

## 2021-04-16 MED ORDER — INSULIN ASPART 100 UNIT/ML IJ SOLN
0.0000 [IU] | Freq: Every day | INTRAMUSCULAR | Status: DC
  Administered 2021-04-16: 3 [IU] via SUBCUTANEOUS
  Administered 2021-04-17: 2 [IU] via SUBCUTANEOUS
  Administered 2021-04-18: 3 [IU] via SUBCUTANEOUS

## 2021-04-16 MED ORDER — FUROSEMIDE 40 MG PO TABS: 40.0000 mg | ORAL_TABLET | Freq: Two times a day (BID) | ORAL | Status: DC

## 2021-04-16 MED ORDER — ORAL CARE MOUTH RINSE
15.0000 mL | Freq: Two times a day (BID) | OROMUCOSAL | Status: DC
  Administered 2021-04-16 – 2021-04-22 (×13): 15 mL via OROMUCOSAL

## 2021-04-16 MED ORDER — INSULIN ASPART 100 UNIT/ML IJ SOLN
4.0000 [IU] | Freq: Three times a day (TID) | INTRAMUSCULAR | Status: DC
  Administered 2021-04-16 – 2021-04-21 (×15): 4 [IU] via SUBCUTANEOUS

## 2021-04-16 MED ORDER — ROSUVASTATIN CALCIUM 20 MG PO TABS
20.0000 mg | ORAL_TABLET | Freq: Every day | ORAL | Status: DC
  Administered 2021-04-16 – 2021-04-23 (×8): 20 mg via ORAL
  Filled 2021-04-16 (×8): qty 1

## 2021-04-16 MED ORDER — INSULIN ASPART PROT & ASPART (70-30 MIX) 100 UNIT/ML ~~LOC~~ SUSP
35.0000 [IU] | Freq: Two times a day (BID) | SUBCUTANEOUS | Status: DC
  Administered 2021-04-16 – 2021-04-22 (×13): 35 [IU] via SUBCUTANEOUS
  Filled 2021-04-16: qty 10

## 2021-04-16 MED ORDER — INSULIN ASPART 100 UNIT/ML IJ SOLN
0.0000 [IU] | Freq: Three times a day (TID) | INTRAMUSCULAR | Status: DC
  Administered 2021-04-16 – 2021-04-17 (×3): 3 [IU] via SUBCUTANEOUS
  Administered 2021-04-17 (×2): 5 [IU] via SUBCUTANEOUS
  Administered 2021-04-18: 3 [IU] via SUBCUTANEOUS
  Administered 2021-04-18 (×2): 2 [IU] via SUBCUTANEOUS
  Administered 2021-04-19: 5 [IU] via SUBCUTANEOUS
  Administered 2021-04-20: 2 [IU] via SUBCUTANEOUS
  Administered 2021-04-20 – 2021-04-22 (×4): 3 [IU] via SUBCUTANEOUS
  Administered 2021-04-23 (×2): 5 [IU] via SUBCUTANEOUS

## 2021-04-16 NOTE — Evaluation (Signed)
Physical Therapy Evaluation Patient Details Name: Heidi Stark MRN: [id number] DOB: [birth year] Today's Date: 04/16/2021  History of Present Illness  80 y.o. female who presented to ED 04/15/21 with shortness of breath. PMH significant of PAF, hypertension, hyperlipidemia, COPD, systolic heart failure, type 2 diabetes, OSA on CPAP, history of DVT, CKD stage III, hx of VT s/p ICD placement  Clinical Impression   Pt admitted secondary to problem above with deficits below. PTA patient was independent with only occasional use of RW if her achilles tendon "flared up."  Pt currently was not able to ambulate due to hypotension with MAP 53. Tolerated exercises while feet elevated with MAP increasing to 68. Encouraged fluids and made RN aware. Anticipate as BP improves, pt will progress with mobility and be able to return home with use of RW and no further PT. Anticipate patient will benefit from PT to address problems listed below.Will continue to follow acutely to maximize functional mobility independence and safety.          Recommendations for follow up therapy are one component of a multi-disciplinary discharge planning process, led by the attending physician.  Recommendations may be updated based on patient status, additional functional criteria and insurance authorization.  Follow Up Recommendations No PT follow up (if progresses as anticipated)    Equipment Recommendations  None recommended by PT    Recommendations for Other Services       Precautions / Restrictions Precautions Precautions: Fall Precaution Comments: Watch HR, BP and O2 sats Restrictions Weight Bearing Restrictions: No      Mobility  Bed Mobility Overal bed mobility: Needs Assistance Bed Mobility: Supine to Sit     Supine to sit: Min guard;HOB elevated          Transfers Overall transfer level: Needs assistance Equipment used: Rolling walker (2 wheeled) Transfers: Sit to/from [employer] Sit to Stand: Min assist Stand pivot transfers: Min assist       General transfer comment: in recline position, BP 83/46 (MAP 53) therefore did not progress to transfers/standing.  Ambulation/Gait             General Gait Details: unable due to MAP 50s-60s and feeling washed out  Stairs            Wheelchair Mobility    Modified Rankin (Stroke Patients Only)       Balance Overall balance assessment: Needs assistance Sitting-balance support: Feet supported Sitting balance-Leahy Scale: Fair     Standing balance support: Bilateral upper extremity supported Standing balance-Leahy Scale: Poor Standing balance comment: needing external support                             Pertinent Vitals/Pain Pain Assessment: No/denies pain    Home Living Family/patient expects to be discharged to:: Private residence Living Arrangements: Spouse/significant other Available Help at Discharge: Family;Available 24 hours/day Type of Home: House Home Access: Stairs to enter Entrance Stairs-Rails: Can reach both;Left;Right Entrance Stairs-Number of Steps: 4 Home Layout: One level Home Equipment: Walker - 2 wheels;Cane - single point;Shower seat      Prior Function Level of Independence: Independent with assistive device(s)         Comments: occasional use of RW due to old ankle injury to L LE     Hand Dominance   Dominant Hand: Right    Extremity/Trunk Assessment   Upper Extremity Assessment Upper Extremity Assessment: Defer to OT evaluation  Lower Extremity Assessment Lower Extremity Assessment: Overall WFL for tasks assessed    Cervical / Trunk Assessment Cervical / Trunk Assessment: Kyphotic  Communication   Communication: No difficulties  Cognition Arousal/Alertness: Awake/alert Behavior During Therapy: WFL for tasks assessed/performed Overall Cognitive Status: Within Functional Limits for tasks assessed                                         General Comments      Exercises General Exercises - Lower Extremity Ankle Circles/Pumps: AROM;Both;20 reps Quad Sets: AROM;Both;10 reps Hip ABduction/ADduction: AROM;Both;5 reps Other Exercises Other Exercises: Pt with IS and flutter valve in front of her. States she is doing them evey commercial break.   Assessment/Plan    PT Assessment Patient needs continued PT services  PT Problem List Decreased activity tolerance;Decreased balance;Decreased mobility;Decreased knowledge of use of DME;Decreased knowledge of precautions;Cardiopulmonary status limiting activity       PT Treatment Interventions DME instruction;Gait training;Stair training;Functional mobility training;Therapeutic activities;Therapeutic exercise;Patient/family education;Balance training    PT Goals (Current goals can be found in the Care Plan section)  Acute Rehab PT Goals Patient Stated Goal: Return home PT Goal Formulation: With patient Time For Goal Achievement: 04/30/21 Potential to Achieve Goals: Good    Frequency Min 3X/week   Barriers to discharge        Co-evaluation               AM-PAC PT "6 Clicks" Mobility  Outcome Measure Help needed turning from your back to your side while in a flat bed without using bedrails?: None Help needed moving from lying on your back to sitting on the side of a flat bed without using bedrails?: A Little Help needed moving to and from a bed to a chair (including a wheelchair)?: A Little Help needed standing up from a chair using your arms (e.g., wheelchair or bedside chair)?: A Little Help needed to walk in hospital room?: Total Help needed climbing 3-5 steps with a railing? : Total 6 Click Score: 15    End of Session Equipment Utilized During Treatment: Oxygen Activity Tolerance: Treatment limited secondary to medical complications (Comment) (limited by orthostasis) Patient left: in chair;with call bell/phone within reach Nurse  Communication: Other (comment) (hypotension) PT Visit Diagnosis: Difficulty in walking, not elsewhere classified (R26.2)    Time: 6789-3810 PT Time Calculation (min) (ACUTE ONLY): 15 min   Charges:   PT Evaluation $PT Eval Low Complexity: 1 Low           Arby Barrette, PT Pager [phone]   Rexanne Mano 04/16/2021, 12:38 PM

## 2021-04-16 NOTE — Evaluation (Signed)
Occupational Therapy Evaluation Patient Details Name: Heidi Stark MRN: [id number] DOB: [birth year] Today's Date: 04/16/2021   History of Present Illness 80 y.o. 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   Clinical Impression   Patient admitted for the diagnosis above.  PTA she lives with her spouse, and her daughter stops by daily to assist with home management and meals.  She occasionally uses a RW due to an old achilles  tendon injury to her L leg.  She was able to complete her own ADL without assist.  Deficits impacting independence are listed below.  Currently she is needing Min A for basic transfers and lower body ADL.  HR increased to 63 with transfers, but generally around 50 BPM.  O2 sats dropped to 88% on 4L with transfers, but did rebound.  OT to follow in the acute setting, she is open to [facility] OT if needed.       Recommendations for follow up therapy are one component of a multi-disciplinary discharge planning process, led by the attending physician.  Recommendations may be updated based on patient status, additional functional criteria and insurance authorization.   Follow Up Recommendations  Home health OT    Equipment Recommendations  None recommended by OT    Recommendations for Other Services       Precautions / Restrictions Precautions Precautions: Fall Precaution Comments: Watch HR and O2 sats Restrictions Weight Bearing Restrictions: No      Mobility Bed Mobility Overal bed mobility: Needs Assistance Bed Mobility: Supine to Sit     Supine to sit: Min guard;HOB elevated       Patient Response: Flat affect  Transfers Overall  transfer level: Needs assistance Equipment used: Rolling walker (2 wheeled) Transfers: Sit to/from [facility] Sit to Stand: Min assist Stand pivot transfers: Min assist       General transfer comment: shaky but no dizziness    Balance Overall balance assessment: Needs assistance Sitting-balance support: Feet supported Sitting balance-Leahy Scale: Fair     Standing balance support: Bilateral upper extremity supported Standing balance-Leahy Scale: Poor Standing balance comment: needing external support                           ADL either performed or assessed with clinical judgement   ADL Overall ADL's : Needs assistance/impaired Eating/Feeding: Set up;Sitting   Grooming: Wash/dry hands;Wash/dry face;Min guard;Sitting   Upper Body Bathing: Supervision/ safety;Sitting   Lower Body Bathing: Minimal assistance;Sitting/lateral leans   Upper Body Dressing : Supervision/safety;Sitting   Lower Body Dressing: Moderate assistance;Sitting/lateral leans   Toilet Transfer: Minimal assistance;BSC   Toileting- Clothing Manipulation and Hygiene: Min guard;Sit to/from stand       Functional mobility during ADLs: Minimal assistance       Vision Baseline Vision/History: 1 Wears glasses Patient Visual Report: No change from baseline       Perception  [facility]   Praxis  Intact    Pertinent Vitals/Pain Pain Assessment: No/denies pain     Hand Dominance Right   Extremity/Trunk Assessment Upper Extremity Assessment Upper Extremity Assessment: Overall WFL for tasks assessed   Lower Extremity Assessment Lower Extremity Assessment: Defer to  PT evaluation   Cervical / Trunk Assessment Cervical / Trunk Assessment: Kyphotic   Communication Communication Communication: No difficulties   Cognition Arousal/Alertness: Awake/alert Behavior During Therapy: WFL for tasks assessed/performed Overall Cognitive Status: Within Functional Limits for tasks  assessed                                                      Home Living Family/patient expects to be discharged to:: Private residence   Available Help at Discharge: Family;Available 24 hours/day Type of Home: House Home Access: Stairs to enter [employer] of Steps: 4 Entrance Stairs-Rails: Can reach both;Left;Right Home Layout: One level     Bathroom Shower/Tub: Occupational psychologist: Standard Bathroom Accessibility: Yes How Accessible: Accessible via walker Home Equipment: [place] - 2 wheels;Cane - single point;Shower seat          Prior Functioning/Environment Level of Independence: Independent with assistive device(s)        Comments: occasional use of RW due to old ankle injury to L LE        OT Problem List: Decreased activity tolerance;Impaired balance (sitting and/or standing);Decreased knowledge of use of DME or AE      OT Treatment/Interventions: Self-care/ADL training;Therapeutic exercise;Therapeutic activities;Energy conservation;Patient/family education;Balance training    OT Goals(Current goals can be found in the care plan section) Acute Rehab OT Goals Patient Stated Goal: Return home OT Goal Formulation: With patient Time For Goal Achievement: 04/30/21 Potential to Achieve Goals: Good ADL Goals Pt Will Perform Grooming: with set-up;sitting;standing Pt Will Perform Lower Body Bathing: with set-up;sit to/from stand Pt Will Perform Lower Body Dressing: with set-up;sit to/from stand Pt Will Transfer to Toilet: with modified independence;ambulating;regular height toilet  OT Frequency: Min 2X/week   Barriers to D/C:    None noted       Co-evaluation              AM-PAC OT "6 Clicks" Daily Activity     Outcome Measure Help from another person eating meals?: None Help from another person taking care of personal grooming?: None Help from another person toileting, which includes using toliet,  bedpan, or urinal?: A Little Help from another person bathing (including washing, rinsing, drying)?: A Little Help from another person to put on and taking off regular upper body clothing?: None Help from another person to put on and taking off regular lower body clothing?: A Little 6 Click Score: 21   End of Session Equipment Utilized During Treatment: Rolling walker;Oxygen Nurse Communication: Mobility status  Activity Tolerance: Patient tolerated treatment well Patient left: in chair;with call bell/phone within reach  OT Visit Diagnosis: Unsteadiness on feet (R26.81);Muscle weakness (generalized) (M62.81)                Time: 8657-8469 OT Time Calculation (min): 20 min Charges:  OT General Charges $OT Visit: 1 Visit OT Evaluation $OT Eval Moderate Complexity: 1 Mod  04/16/2021  RP, OTR/L  Acute Rehabilitation Services  Office:  [place] 04/16/2021, 9:01 AM

## 2021-04-16 NOTE — Progress Notes (Signed)
PROGRESS NOTE                                                                                                                                                                                                             Patient Demographics:    Heidi Stark, is a 80 y.o. female, DOB - 1941-03-06, VHQ:[id number]  Outpatient Primary MD for the patient is [name], [name], DO    LOS - 1  Admit date - 04/15/2021    Chief Complaint  Patient presents with   Shortness of Breath       Brief Narrative (HPI from H&P)    Heidi Stark is a 80 y.o. 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  Of note she is not vaccinated.   Subjective:    [name] today has, No headache, No chest pain, No abdominal pain - No Nausea, No new weakness tingling or numbness, no cough but is SOB.   Assessment  & Plan :      Acute Hypoxic Resp. Failure due to Acute Covid 19 Viral Pneumonitis during the ongoing 2020 Covid 19 Pandemic - she seems to have moderate to severe respiratory involvement, currently on 5 L nasal cannula oxygen, has been placed on steroids and Remdesivir which will be continued.  She has consented for Actemra use if she declines further.  Continue to monitor closely trend inflammatory markers.  Stop azithromycin.  Encouraged the patient to sit up in chair in the daytime use I-S and flutter valve for pulmonary toiletry.  Will advance activity and titrate down oxygen as  possible.  Actemra/Baricitinib  off label use - patient was told that if COVID-19 pneumonitis gets worse we might potentially use Actemra off label, patient denies any known history of active diverticulitis, tuberculosis or hepatitis, understands the risks and benefits and wants to proceed with Actemra treatment if required.   Recent Labs  Lab 04/15/21 0224 04/15/21 0234 04/15/21 1044  04/15/21 1430 04/16/21 0047  WBC  --  8.6  --   --  4.8  HGB  --  16.2*  --   --  15.7*  HCT  --  48.9*  --   --  46.5*  PLT  --  128*  --   --  117*  CRP  --   --   --  3.7* 4.2*  BNP  --   --  47.4  --  72.0  DDIMER  --   --   --  0.49 0.51*  AST  --   --   --  42* 41  ALT  --   --   --  23 26  ALKPHOS  --   --   --  44 52  BILITOT  --   --   --  0.8 0.6  ALBUMIN  --   --   --  3.0* 3.0*  SARSCOV2NAA POSITIVE*  --   --   --   --     2.  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  Currently on combination of lisinopril, beta-blocker and Eliquis which will be continued.  3.  History of chronic diastolic heart failure EF 50%.  She has AICD.  Currently appears mildly dehydrated, continue beta-blocker, continue Entresto, skip diuretics and hydrate.  4. Underlying history of COPD as needed 2 L of oxygen at home.  Supportive care for  5.  OSA.  CPAP at night.  6. History of morbid obesity.  BMI 42.  Follow with PCP.  7.  Dehydration with hyponatremia.  IV fluids, hold diuretics and gently hydrate    8.  Hyperlipidemia.  As in #7 above, urine electrolytes along with serum osmolality ordered.  9.  Mild elevation in troponin due to demand ischemia from hypoxia.  No chest pain or acute ST changes.  No further work-up.  10.  V. tach in the past.  Has AICD.  Continue beta-blocker.  EF has improved.  11.  Dyslipidemia.  On statin continue.  12.  DM type II.  On 70/30, Premeal NovoLog and sliding scale, dose  adjsuted.  Lab Results  Component Value Date   HGBA1C 9.0 (H) 04/15/2021    CBG (last 3)  Recent Labs    04/15/21 0822 04/15/21 2045  GLUCAP 167* 348*         Condition - Extremely Guarded  Family Communication  :  daughter Debroah Baller [phone] on 04/16/21  Code Status :  Full  Consults  :  None  PUD Prophylaxis :    Procedures  :            Disposition Plan  :    Status is: Inpatient  Remains inpatient appropriate because:IV treatments appropriate due to intensity of illness or inability to take PO  Dispo: The patient is from: Home              Anticipated d/c is to: Home              Patient currently is not medically stable to d/c.   Difficult to place patient Yes   DVT Prophylaxis  :   apixaban (ELIQUIS) tablet 5 mg      Lab Results  Component Value Date   PLT 117 (L) 04/16/2021    Diet :  Diet Order  Diet heart healthy/carb modified Room service appropriate? Yes; Fluid consistency: Thin  Diet effective now                    Inpatient Medications  Scheduled Meds:  amiodarone  200 mg Oral Daily   apixaban  5 mg Oral BID   vitamin C  500 mg Oral Daily   dapagliflozin propanediol  5 mg Oral Daily   dexamethasone (DECADRON) injection  6 mg Intravenous Q24H   fenofibrate  160 mg Oral Daily   icosapent Ethyl  2 g Oral BID   insulin aspart  0-15 Units Subcutaneous TID WC   insulin aspart  0-5 Units Subcutaneous QHS   insulin aspart  4 Units Subcutaneous TID WC   insulin aspart protamine- aspart  35 Units Subcutaneous BID WC   Ipratropium-Albuterol  1 puff Inhalation Q6H   mouth rinse  15 mL Mouth Rinse BID   melatonin  5 mg Oral QHS   metoprolol succinate  25 mg Oral Daily   multivitamin with minerals  1 tablet Oral Daily   potassium chloride  20 mEq Oral BID   rosuvastatin  20 mg Oral Daily   sacubitril-valsartan  1 tablet Oral BID   sodium chloride flush  3 mL Intravenous Q12H   zinc sulfate  220 mg Oral Daily    Continuous Infusions:  lactated ringers     remdesivir 100 mg in NS 100 mL     PRN Meds:.acetaminophen, albuterol, guaiFENesin-dextromethorphan  Antibiotics  :    Anti-infectives (From admission, onward)    Start     Dose/Rate Route Frequency Ordered Stop   04/16/21 1445  azithromycin (ZITHROMAX) tablet 500 mg  Status:  Discontinued       See Hyperspace for full Linked Orders Report.   500 mg Oral Daily 04/15/21 1437 04/16/21 1035   04/16/21 1000  remdesivir 100 mg in sodium chloride 0.9 % 100 mL IVPB       See Hyperspace for full Linked Orders Report.   100 mg 200 mL/hr over 30 Minutes Intravenous Daily 04/15/21 1324 04/20/21 0959   04/16/21 1000  remdesivir 100 mg in sodium chloride 0.9 % 100 mL IVPB  Status:  Discontinued       See Hyperspace for full Linked Orders Report.   100 mg 200 mL/hr over 30 Minutes Intravenous Daily 04/15/21 1437 04/15/21 1438   04/15/21 1500  remdesivir 200 mg in sodium chloride 0.9% 250 mL IVPB       See Hyperspace for full Linked Orders Report.   200 mg 580 mL/hr over 30 Minutes Intravenous Once 04/15/21 1324 04/15/21 1722   04/15/21 1445  azithromycin (ZITHROMAX) 500 mg in sodium chloride 0.9 % 250 mL IVPB       See Hyperspace for full Linked Orders Report.   500 mg 250 mL/hr over 60 Minutes Intravenous Every 24 hours 04/15/21 1437 04/15/21 1737   04/15/21 1445  remdesivir 200 mg in sodium chloride 0.9% 250 mL IVPB  Status:  Discontinued       See Hyperspace for full Linked Orders Report.   200 mg 580 mL/hr over 30 Minutes Intravenous Once 04/15/21 1437 04/15/21 1438        Time Spent in minutes  30   Lala Lund M.D on 04/16/2021 at 10:44 AM  To page go to [email or web address]   [facility] -  Office  [phone]      See all Orders from today for further details  Objective:   Vitals:   04/16/21 0100 04/16/21 0334 04/16/21 0427 04/16/21 0823  BP:  (!) 89/49 (!) 122/46 (!) 112/56  Pulse: (!) 52 (!) 46 (!) 47 (!) 51   Resp: 19 18 17 15   Temp:  97.6 F (36.4 C) 97.7 F (36.5 C) 97.9 F (36.6 C)  TempSrc:  Oral Oral Oral  SpO2: 92% 95% 97% 92%  Weight:      Height:        Wt Readings from Last 3 Encounters:  04/16/21 120.2 kg  01/27/21 120.5 kg  12/20/20 119.3 kg     Intake/Output Summary (Last 24 hours) at 04/16/2021 1044 Last data filed at 04/16/2021 0430 Gross per 24 hour  Intake 60 ml  Output --  Net 60 ml     Physical Exam  Awake Alert, No new F.N deficits, Normal affect [place].AT,PERRAL Supple Neck,No JVD, No cervical lymphadenopathy appriciated.  Symmetrical Chest wall movement, Good air movement bilaterally, CTAB RRR,No Gallops,Rubs or new Murmurs, No Parasternal Heave +ve B.Sounds, Abd Soft, No tenderness, No organomegaly appriciated, No rebound - guarding or rigidity. No Cyanosis, Clubbing or edema, No new Rash or bruise      Data Review:    CBC Recent Labs  Lab 04/15/21 0234 04/16/21 0047  WBC 8.6 4.8  HGB 16.2* 15.7*  HCT 48.9* 46.5*  PLT 128* 117*  MCV 94.6 92.8  MCH 31.3 31.3  MCHC 33.1 33.8  RDW 15.2 15.2  LYMPHSABS 2.8 1.3  MONOABS 0.8 0.2  EOSABS 0.0 0.0  BASOSABS 0.0 0.0    Recent Labs  Lab 04/15/21 0234 04/15/21 1044 04/15/21 1430 04/15/21 1440 04/16/21 0047  NA 133*  --   --   --  131*  K 3.9  --   --   --  4.2  CL 96*  --   --   --  96*  CO2 24  --   --   --  23  GLUCOSE 172*  --   --   --  325*  BUN 17  --   --   --  26*  CREATININE 1.34*  --   --   --  1.33*  CALCIUM 8.2*  --   --   --  8.2*  AST  --   --  42*  --  41  ALT  --   --  23  --  26  ALKPHOS  --   --  44  --  52  BILITOT  --   --  0.8  --  0.6  ALBUMIN  --   --  3.0*  --  3.0*  MG  --   --  2.3  --  2.3  CRP  --   --  3.7*  --  4.2*  DDIMER  --   --  0.49  --  0.51*  HGBA1C  --   --   --  9.0*  --   BNP  --  47.4  --   --  72.0    ------------------------------------------------------------------------------------------------------------------ No results for  input(s): CHOL, HDL, LDLCALC, TRIG, CHOLHDL, LDLDIRECT in the last 72 hours.  Lab Results  Component Value Date   HGBA1C 9.0 (H) 04/15/2021   ------------------------------------------------------------------------------------------------------------------ No results for input(s): TSH, T4TOTAL, T3FREE, THYROIDAB in the last 72 hours.  Invalid input(s): FREET3  Cardiac Enzymes No results for input(s): CKMB, TROPONINI, MYOGLOBIN in the last 168 hours.  Invalid input(s): CK ------------------------------------------------------------------------------------------------------------------    Component Value Date/Time  BNP 72.0 04/16/2021 0047     Radiology Reports DG Chest 2 View  Result Date: 04/15/2021 CLINICAL DATA:  Shortness of breath x2 days. EXAM: CHEST - 2 VIEW COMPARISON:  September 16, 2020 FINDINGS: A single lead ventricular pacer is noted. Mild, diffuse, chronic appearing increased interstitial lung markings are noted. There is no evidence of acute infiltrate, pleural effusion or pneumothorax. The cardiac silhouette is moderately enlarged and unchanged in size. The visualized skeletal structures are unremarkable. IMPRESSION: Stable cardiomegaly with chronic appearing increased interstitial lung markings. Electronically Signed   By: Virgina Norfolk M.D.   On: 04/15/2021 03:12   DG Chest Port 1 View  Result Date: 04/16/2021 CLINICAL DATA:  Shortness of breath EXAM: PORTABLE CHEST 1 VIEW COMPARISON:  04/16/2019 FINDINGS: Cardiomegaly. Single chamber pacer lead into the right ventricle. Artifact from EKG leads. Mild interstitial coarsening. Airway thickening and paraseptal emphysema by 2018 chest CT. IMPRESSION: Baseline appearance of the chest.  No acute finding. Electronically Signed   By: Jorje Guild M.D.   On: 04/16/2021 07:37

## 2021-04-16 NOTE — Progress Notes (Signed)
   04/16/21 0427  Assess: MEWS Score  Temp 97.7 F (36.5 C)  BP (!) 122/46  Pulse Rate (!) 47  ECG Heart Rate (!) 48  Resp 17  SpO2 97 %  O2 Device Nasal Cannula  Patient Activity (if Appropriate) In bed  O2 Flow Rate (L/min) 4 L/min  Assess: MEWS Score  MEWS Temp 0  MEWS Systolic 0  MEWS Pulse 1  MEWS RR 0  MEWS LOC 0  MEWS Score 1  MEWS Score Color Green  Treat  Pain Scale 0-10  Pain Score 0  Notify: Provider  Provider Name/Title Olevia Bowens, MD  Date Provider Notified 04/16/21  Time Provider Notified [phone]  Notification Type Page  Notification Reason Other (Comment) (Per tele,HR as low as 35, but maintaining mid 40s while asleep.)  Provider response No new orders (Patient needs CPAP on.)  Date of Provider Response 04/16/21  Time of Provider Response 0345 (Via secure chat.)   Patient easy to arouse, drowsy but able to hold conversation. Patient denies dizziness at this time, no distress noted. Patient assisted to apply CPAP. Will continue to monitor.

## 2021-04-16 NOTE — Progress Notes (Signed)
[facility] night shift telemetry coverage note.  The nursing staff reported that the patient has been bradycardic in the 40s.  However, she has transiently decreased her heart rate to the 30s at times.  She takes metoprolol at home but this was held.  She has a history of OSA and according to the staff she was not ready for it and then fell asleep.  Not sure about CPAP compliance at home.  CPAP nightly ordered.  Tennis Must, MD.

## 2021-04-17 ENCOUNTER — Inpatient Hospital Stay (HOSPITAL_COMMUNITY): Payer: Medicare Other

## 2021-04-17 DIAGNOSIS — U071 COVID-19: Secondary | ICD-10-CM | POA: Diagnosis not present

## 2021-04-17 LAB — CBC WITH DIFFERENTIAL/PLATELET
Abs Immature Granulocytes: 0 10*3/uL (ref 0.00–0.07)
Basophils Absolute: 0 10*3/uL (ref 0.0–0.1)
Basophils Relative: 0 %
Eosinophils Absolute: 0 10*3/uL (ref 0.0–0.5)
Eosinophils Relative: 0 %
HCT: 45.2 % (ref 36.0–46.0)
Hemoglobin: 14.7 g/dL (ref 12.0–15.0)
Lymphocytes Relative: 21 %
Lymphs Abs: 0.9 10*3/uL (ref 0.7–4.0)
MCH: 30.7 pg (ref 26.0–34.0)
MCHC: 32.5 g/dL (ref 30.0–36.0)
MCV: 94.4 fL (ref 80.0–100.0)
Monocytes Absolute: 0.4 10*3/uL (ref 0.1–1.0)
Monocytes Relative: 8 %
Neutro Abs: 3.2 10*3/uL (ref 1.7–7.7)
Neutrophils Relative %: 71 %
Platelets: 142 10*3/uL — ABNORMAL LOW (ref 150–400)
RBC: 4.79 MIL/uL (ref 3.87–5.11)
RDW: 15 % (ref 11.5–15.5)
WBC: 4.5 10*3/uL (ref 4.0–10.5)
nRBC: 0 % (ref 0.0–0.2)
nRBC: 1 /100 WBC — ABNORMAL HIGH

## 2021-04-17 LAB — COMPREHENSIVE METABOLIC PANEL
ALT: 25 U/L (ref 0–44)
AST: 34 U/L (ref 15–41)
Albumin: 2.7 g/dL — ABNORMAL LOW (ref 3.5–5.0)
Alkaline Phosphatase: 42 U/L (ref 38–126)
Anion gap: 12 (ref 5–15)
BUN: 34 mg/dL — ABNORMAL HIGH (ref 8–23)
CO2: 24 mmol/L (ref 22–32)
Calcium: 8.7 mg/dL — ABNORMAL LOW (ref 8.9–10.3)
Chloride: 97 mmol/L — ABNORMAL LOW (ref 98–111)
Creatinine, Ser: 1.36 mg/dL — ABNORMAL HIGH (ref 0.44–1.00)
GFR, Estimated: 40 mL/min — ABNORMAL LOW (ref >60)
Glucose, Bld: 212 mg/dL — ABNORMAL HIGH (ref 70–99)
Potassium: 4.8 mmol/L (ref 3.5–5.1)
Sodium: 133 mmol/L — ABNORMAL LOW (ref 135–145)
Total Bilirubin: 0.4 mg/dL (ref 0.3–1.2)
Total Protein: 6.4 g/dL — ABNORMAL LOW (ref 6.5–8.1)

## 2021-04-17 LAB — PROCALCITONIN: Procalcitonin: <0.1 ng/mL

## 2021-04-17 LAB — D-DIMER, QUANTITATIVE: D-Dimer, Quant: 0.48 ug/mL-FEU (ref 0.00–0.50)

## 2021-04-17 LAB — BRAIN NATRIURETIC PEPTIDE: B Natriuretic Peptide: 94.3 pg/mL (ref 0.0–100.0)

## 2021-04-17 LAB — URIC ACID, RANDOM URINE: Uric Acid, Urine: 24.8 mg/dL

## 2021-04-17 LAB — MAGNESIUM: Magnesium: 2.2 mg/dL (ref 1.7–2.4)

## 2021-04-17 LAB — GLUCOSE, CAPILLARY
Glucose-Capillary: 205 mg/dL — ABNORMAL HIGH (ref 70–99)
Glucose-Capillary: 220 mg/dL — ABNORMAL HIGH (ref 70–99)
Glucose-Capillary: 164 mg/dL — ABNORMAL HIGH (ref 70–99)
Glucose-Capillary: 235 mg/dL — ABNORMAL HIGH (ref 70–99)

## 2021-04-17 LAB — C-REACTIVE PROTEIN: CRP: 2.7 mg/dL — ABNORMAL HIGH (ref <1.0)

## 2021-04-17 LAB — UREA NITROGEN, URINE: Urea Nitrogen, Ur: 472 mg/dL

## 2021-04-17 MED ORDER — SACUBITRIL-VALSARTAN 24-26 MG PO TABS
1.0000 | ORAL_TABLET | Freq: Two times a day (BID) | ORAL | Status: DC
  Administered 2021-04-17 – 2021-04-19 (×5): 1 via ORAL
  Filled 2021-04-17 (×6): qty 1

## 2021-04-17 MED ORDER — FUROSEMIDE 40 MG PO TABS
40.0000 mg | ORAL_TABLET | Freq: Every day | ORAL | Status: DC
  Administered 2021-04-17 – 2021-04-19 (×3): 40 mg via ORAL
  Filled 2021-04-17 (×3): qty 1

## 2021-04-17 NOTE — Progress Notes (Signed)
Physical Therapy Treatment Patient Details Name: Heidi Stark MRN: [id number] DOB: [birth year] Today's Date: 04/17/2021   History of Present Illness 80 y.o. female who presented to ED 04/15/21 with shortness of breath. PMH significant of PAF, hypertension, hyperlipidemia, COPD, systolic heart failure, type 2 diabetes, OSA on CPAP, history of DVT, CKD stage III, hx of VT s/p ICD placement    PT Comments    Patient up in chair on arrival. BP 140/74 and agreeable to ambulate with RW (pt's preference). As getting up to walk, both RN and housekeeping entered room. Pt became anxious/flustered and only walked 40 ft on 4L with sats >90% and HR 54-64. Reported shortness of breath as reason to sit and rest (?due to activity, appeared more due to anxiousness). Educated on AROM exercises and reviewed use of IS and flutter valve.     Recommendations for follow up therapy are one component of a multi-disciplinary discharge planning process, led by the attending physician.  Recommendations may be updated based on patient status, additional functional criteria and insurance authorization.  Follow Up Recommendations  No PT follow up     Equipment Recommendations  None recommended by PT    Recommendations for Other Services       Precautions / Restrictions Precautions Precautions: Fall Precaution Comments: Watch HR, BP and O2 sats Restrictions Weight Bearing Restrictions: No     Mobility  Bed Mobility                    Transfers Overall transfer level: Needs assistance Equipment used: Rolling walker (2 wheeled) Transfers: Sit to/from [employer] Transfers Sit to Stand: Min assist;Supervision         General transfer comment: no cues needed for safe use of Rw  Ambulation/Gait Ambulation/Gait assistance: Min guard Gait Distance (Feet): 40 Feet Assistive device: Rolling walker (2 wheeled) Gait Pattern/deviations: Step-through pattern;Decreased stride length     General  Gait Details: steady with RW (pt's preference). Denied ankle/leg pain. Flustered as RN came in to give meds followed by housekeeping to clean room and would not walk more than one lap. Sats >90% on 4L, HR 54-64, BP 140/74   Stairs             Wheelchair Mobility    Modified Rankin (Stroke Patients Only)       Balance Overall balance assessment: Needs assistance Sitting-balance support: Feet supported Sitting balance-Leahy Scale: Fair     Standing balance support: Bilateral upper extremity supported Standing balance-Leahy Scale: Poor Standing balance comment: needing external support                            Cognition Arousal/Alertness: Awake/alert Behavior During Therapy: Anxious (multiple staff in room at time to walk and became anxious) Overall Cognitive Status: Within Functional Limits for tasks assessed                                        Exercises General Exercises - Lower Extremity Ankle Circles/Pumps: AROM;Both;20 reps Long Arc Quad: AROM;10 reps;Seated Other Exercises Other Exercises: Pt demonstrated proper use of Flutter valve and IS with min cues. Able to pull 1250 ml on IS    General Comments        Pertinent Vitals/Pain Pain Assessment: No/denies pain    Home Living  Prior Function            PT Goals (current goals can now be found in the care plan section) Acute Rehab PT Goals Patient Stated Goal: Return home PT Goal Formulation: With patient Time For Goal Achievement: 04/30/21 Potential to Achieve Goals: Good Progress towards PT goals: Progressing toward goals    Frequency    Min 3X/week      PT Plan Current plan remains appropriate    Co-evaluation              AM-PAC PT "6 Clicks" Mobility   Outcome Measure  Help needed turning from your back to your side while in a flat bed without using bedrails?: None Help needed moving from lying on your back to  sitting on the side of a flat bed without using bedrails?: A Little Help needed moving to and from a bed to a chair (including a wheelchair)?: A Little Help needed standing up from a chair using your arms (e.g., wheelchair or bedside chair)?: A Little Help needed to walk in hospital room?: A Little Help needed climbing 3-5 steps with a railing? : A Little 6 Click Score: 19    End of Session Equipment Utilized During Treatment: Oxygen Activity Tolerance: Patient limited by fatigue Patient left: in chair;with call bell/phone within reach   PT Visit Diagnosis: Difficulty in walking, not elsewhere classified (R26.2)     Time: 3845-3646 PT Time Calculation (min) (ACUTE ONLY): 17 min  Charges:  $Gait Training: 8-22 mins                      Arby Barrette, PT Pager [phone]    Rexanne Mano 04/17/2021, 9:17 AM

## 2021-04-17 NOTE — Progress Notes (Signed)
PROGRESS NOTE                                                                                                                                                                                                             Patient Demographics:    Heidi Stark, is a 80 y.o. female, DOB - 05-20-1941, BVQ:[id number]  Outpatient Primary MD for the patient is [name], [name], DO    LOS - 2  Admit date - 04/15/2021    Chief Complaint  Patient presents with   Shortness of Breath       Brief Narrative (HPI from H&P)    Heidi Stark is a 80 y.o. 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  Of note she is not vaccinated.   Subjective:   Patient in bed, appears comfortable, denies any headache, no fever, no chest pain or pressure, much improved shortness of breath , no abdominal pain. No focal weakness.   Assessment  & Plan :      Acute Hypoxic Resp. Failure due to Acute Covid 19 Viral Pneumonitis during the ongoing 2020 Covid 19 Pandemic - she seems to have moderate to severe respiratory involvement, currently on 5 L nasal cannula oxygen, has been placed on steroids and Remdesivir which will be continued.  She has consented for Actemra use if she declines further.  Continue to monitor closely trend inflammatory markers.  Stop azithromycin.  Encouraged the patient to sit up in chair in the daytime use I-S and flutter valve for pulmonary toiletry.  Will advance activity and titrate down oxygen as  possible.  Actemra/Baricitinib  off label use - patient was told that if COVID-19 pneumonitis gets worse we might potentially use Actemra off label, patient denies any known history of active diverticulitis, tuberculosis or hepatitis, understands the risks and benefits and wants to proceed with Actemra treatment if required.   Recent Labs  Lab 04/15/21 0224 04/15/21 0234 04/15/21 1044  04/15/21 1430 04/16/21 0047 04/17/21 0116  WBC  --  8.6  --   --  4.8 4.5  HGB  --  16.2*  --   --  15.7* 14.7  HCT  --  48.9*  --   --  46.5* 45.2  PLT  --  128*  --   --  117* 142*  CRP  --   --   --  3.7* 4.2* 2.7*  BNP  --   --  47.4  --  72.0 94.3  DDIMER  --   --   --  0.49 0.51* 0.48  PROCALCITON  --   --   --   --  <0.10 <0.10  AST  --   --   --  42* 41 34  ALT  --   --   --  23 26 25   ALKPHOS  --   --   --  44 52 42  BILITOT  --   --   --  0.8 0.6 0.4  ALBUMIN  --   --   --  3.0* 3.0* 2.7*  SARSCOV2NAA POSITIVE*  --   --   --   --   --     2.  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  Continue Entresto at half home dose, home dose anticoagulation continued, beta-blocker held due to episodes of bradycardia, case discussed with Dr. Peter Martinique, EP on-call, home dose amiodarone continued as she has history of V. tach, monitor on telemetry, she is stable and asymptomatic, check TSH.  3.  History of chronic diastolic heart failure EF 50%, VT.  She has a single chamber MDT ICD.  Was dehydrated and hypotensive, has been gently hydrated on 04/16/2021, received low-dose Lasix, hold beta-blocker as she is bradycardic, Entresto half home dose and monitor.  4. Underlying history of COPD as needed 2 L of oxygen at home.  Supportive care for  5.  OSA.  CPAP at night.  6. History of morbid obesity.  BMI 42.  Follow with PCP.  7.  Dehydration with hyponatremia.  Much  improved post IVF, gently resume Lasix at lower dose.    8.  Hyperlipidemia.  As in #7 above, urine electrolytes along with serum osmolality ordered.  9.  Mild elevation in troponin due to demand ischemia from hypoxia.  No chest pain or acute ST changes.  No further work-up.  10.  V. tach in the past.  Has AICD.  Continue amiodarone for now.  11.  Dyslipidemia.  On statin continue.  12.  DM type II.  On 70/30, Premeal NovoLog and sliding scale, dose adjsuted.  Lab Results  Component Value Date   HGBA1C 9.0 (H) 04/15/2021    CBG (last 3)  Recent Labs    04/16/21 1627 04/16/21 2140 04/17/21 0750  GLUCAP 153* 291* 205*   Lab Results  Component Value Date   TSH 4.435 01/27/2021         Condition - Extremely Guarded  Family Communication  :  daughter Debroah Baller [phone] on 04/16/21  Code Status :  Full  Consults  :  None  PUD Prophylaxis :    Procedures  :            Disposition Plan  :    Status is: Inpatient  Remains inpatient appropriate because:IV treatments appropriate due to intensity  of illness or inability to take PO  Dispo: The patient is from: Home              Anticipated d/c is to: Home              Patient currently is not medically stable to d/c.   Difficult to place patient Yes   DVT Prophylaxis  :   apixaban (ELIQUIS) tablet 5 mg      Lab Results  Component Value Date   PLT 142 (L) 04/17/2021    Diet :  Diet Order             Diet heart healthy/carb modified Room service appropriate? Yes; Fluid consistency: Thin  Diet effective now                    Inpatient Medications  Scheduled Meds:  amiodarone  200 mg Oral Daily   apixaban  5 mg Oral BID   vitamin C  500 mg Oral Daily   dapagliflozin propanediol  5 mg Oral Daily   dexamethasone (DECADRON) injection  6 mg Intravenous Q24H   fenofibrate  160 mg Oral Daily   furosemide  40 mg Oral Daily   icosapent Ethyl  2 g Oral BID   insulin aspart  0-15 Units Subcutaneous  TID WC   insulin aspart  0-5 Units Subcutaneous QHS   insulin aspart  4 Units Subcutaneous TID WC   insulin aspart protamine- aspart  35 Units Subcutaneous BID WC   Ipratropium-Albuterol  1 puff Inhalation Q6H   mouth rinse  15 mL Mouth Rinse BID   melatonin  5 mg Oral QHS   multivitamin with minerals  1 tablet Oral Daily   potassium chloride  20 mEq Oral BID   rosuvastatin  20 mg Oral Daily   sacubitril-valsartan  1 tablet Oral BID   sodium chloride flush  3 mL Intravenous Q12H   zinc sulfate  220 mg Oral Daily   Continuous Infusions:  remdesivir 100 mg in NS 100 mL Stopped (04/16/21 1900)   PRN Meds:.acetaminophen, albuterol, guaiFENesin-dextromethorphan  Antibiotics  :    Anti-infectives (From admission, onward)    Start     Dose/Rate Route Frequency Ordered Stop   04/16/21 1445  azithromycin (ZITHROMAX) tablet 500 mg  Status:  Discontinued       See Hyperspace for full Linked Orders Report.   500 mg Oral Daily 04/15/21 1437 04/16/21 1035   04/16/21 1000  remdesivir 100 mg in sodium chloride 0.9 % 100 mL IVPB       See Hyperspace for full Linked Orders Report.   100 mg 200 mL/hr over 30 Minutes Intravenous Daily 04/15/21 1324 04/20/21 0959   04/16/21 1000  remdesivir 100 mg in sodium chloride 0.9 % 100 mL IVPB  Status:  Discontinued       See Hyperspace for full Linked Orders Report.   100 mg 200 mL/hr over 30 Minutes Intravenous Daily 04/15/21 1437 04/15/21 1438   04/15/21 1500  remdesivir 200 mg in sodium chloride 0.9% 250 mL IVPB       See Hyperspace for full Linked Orders Report.   200 mg 580 mL/hr over 30 Minutes Intravenous Once 04/15/21 1324 04/15/21 1722   04/15/21 1445  azithromycin (ZITHROMAX) 500 mg in sodium chloride 0.9 % 250 mL IVPB       See Hyperspace for full Linked Orders Report.   500 mg 250 mL/hr over 60 Minutes Intravenous Every 24 hours 04/15/21  1437 04/15/21 1737   04/15/21 1445  remdesivir 200 mg in sodium chloride 0.9% 250 mL IVPB  Status:   Discontinued       See Hyperspace for full Linked Orders Report.   200 mg 580 mL/hr over 30 Minutes Intravenous Once 04/15/21 1437 04/15/21 1438        Time Spent in minutes  30   Lala Lund M.D on 04/17/2021 at 8:29 AM  To page go to [email or web address]   [facility] -  Office  [phone]      See all Orders from today for further details    Objective:   Vitals:   04/16/21 2030 04/17/21 0004 04/17/21 0347 04/17/21 0746  BP: (!) 107/45 (!) 109/52 (!) 105/57 107/61  Pulse: (!) 53 (!) 57 (!) 49 (!) 51  Resp: 20 15 15 19   Temp: 98.2 F (36.8 C) 98.4 F (36.9 C) (!) 97.2 F (36.2 C) (!) 97.4 F (36.3 C)  TempSrc: Oral Axillary Axillary Oral  SpO2: 93% 91% 91% 92%  Weight:      Height:        Wt Readings from Last 3 Encounters:  04/16/21 120.2 kg  01/27/21 120.5 kg  12/20/20 119.3 kg     Intake/Output Summary (Last 24 hours) at 04/17/2021 0829 Last data filed at 04/17/2021 0610 Gross per 24 hour  Intake 1336.5 ml  Output 2100 ml  Net -763.5 ml     Physical Exam  Awake Alert, No new F.N deficits, Normal affect [place].AT,PERRAL Supple Neck,No JVD, No cervical lymphadenopathy appriciated.  Symmetrical Chest wall movement, Good air movement bilaterally, CTAB iRRR,No Gallops, Rubs or new Murmurs, No Parasternal Heave +ve B.Sounds, Abd Soft, No tenderness, No organomegaly appriciated, No rebound - guarding or rigidity. No Cyanosis, Clubbing or edema, No new Rash or bruise    Data Review:    CBC Recent Labs  Lab 04/15/21 0234 04/16/21 0047 04/17/21 0116  WBC 8.6 4.8 4.5  HGB 16.2* 15.7* 14.7  HCT 48.9* 46.5* 45.2  PLT 128* 117* 142*  MCV 94.6 92.8 94.4  MCH 31.3 31.3 30.7  MCHC 33.1 33.8 32.5  RDW 15.2 15.2 15.0  LYMPHSABS 2.8 1.3 0.9  MONOABS 0.8 0.2 0.4  EOSABS 0.0 0.0 0.0  BASOSABS 0.0 0.0 0.0    Recent Labs  Lab 04/15/21 0234 04/15/21 1044 04/15/21 1430 04/15/21 1440 04/16/21 0047 04/17/21 0116  NA 133*  --   --   --  131* 133*   K 3.9  --   --   --  4.2 4.8  CL 96*  --   --   --  96* 97*  CO2 24  --   --   --  23 24  GLUCOSE 172*  --   --   --  325* 212*  BUN 17  --   --   --  26* 34*  CREATININE 1.34*  --   --   --  1.33* 1.36*  CALCIUM 8.2*  --   --   --  8.2* 8.7*  AST  --   --  42*  --  41 34  ALT  --   --  23  --  26 25  ALKPHOS  --   --  44  --  52 42  BILITOT  --   --  0.8  --  0.6 0.4  ALBUMIN  --   --  3.0*  --  3.0* 2.7*  MG  --   --  2.3  --  2.3 2.2  CRP  --   --  3.7*  --  4.2* 2.7*  DDIMER  --   --  0.49  --  0.51* 0.48  PROCALCITON  --   --   --   --  <0.10 <0.10  HGBA1C  --   --   --  9.0*  --   --   BNP  --  47.4  --   --  72.0 94.3    ------------------------------------------------------------------------------------------------------------------ No results for input(s): CHOL, HDL, LDLCALC, TRIG, CHOLHDL, LDLDIRECT in the last 72 hours.  Lab Results  Component Value Date   HGBA1C 9.0 (H) 04/15/2021   ------------------------------------------------------------------------------------------------------------------ No results for input(s): TSH, T4TOTAL, T3FREE, THYROIDAB in the last 72 hours.  Invalid input(s): FREET3  Cardiac Enzymes No results for input(s): CKMB, TROPONINI, MYOGLOBIN in the last 168 hours.  Invalid input(s): CK ------------------------------------------------------------------------------------------------------------------    Component Value Date/Time   BNP 94.3 04/17/2021 0116     Radiology Reports DG Chest 2 View  Result Date: 04/15/2021 CLINICAL DATA:  Shortness of breath x2 days. EXAM: CHEST - 2 VIEW COMPARISON:  September 16, 2020 FINDINGS: A single lead ventricular pacer is noted. Mild, diffuse, chronic appearing increased interstitial lung markings are noted. There is no evidence of acute infiltrate, pleural effusion or pneumothorax. The cardiac silhouette is moderately enlarged and unchanged in size. The visualized skeletal structures are unremarkable.  IMPRESSION: Stable cardiomegaly with chronic appearing increased interstitial lung markings. Electronically Signed   By: Virgina Norfolk M.D.   On: 04/15/2021 03:12   DG Chest Port 1 View  Result Date: 04/17/2021 CLINICAL DATA:  Shortness of breath EXAM: PORTABLE CHEST 1 VIEW COMPARISON:  April 16, 2021 FINDINGS: Stable cardiomegaly. Stable AICD device. The hila and mediastinum are unremarkable. No pneumothorax. Diffuse mild increased interstitial opacities. More focal mild opacities in the bases. No other acute abnormalities. IMPRESSION: 1. Findings are most suggestive of cardiomegaly with mild edema. The more focal opacity in the right base may represent atelectasis or developing infiltrate. Recommend attention on follow-up. Electronically Signed   By: Dorise Bullion III M.D.   On: 04/17/2021 07:44   DG Chest Port 1 View  Result Date: 04/16/2021 CLINICAL DATA:  Shortness of breath EXAM: PORTABLE CHEST 1 VIEW COMPARISON:  04/16/2019 FINDINGS: Cardiomegaly. Single chamber pacer lead into the right ventricle. Artifact from EKG leads. Mild interstitial coarsening. Airway thickening and paraseptal emphysema by 2018 chest CT. IMPRESSION: Baseline appearance of the chest.  No acute finding. Electronically Signed   By: Jorje Guild M.D.   On: 04/16/2021 07:37

## 2021-04-17 NOTE — Plan of Care (Signed)
  Problem: Education: Goal: Knowledge of General Education information will improve Description: Including pain rating scale, medication(s)/side effects and non-pharmacologic comfort measures Outcome: Progressing   Problem: Health Behavior/Discharge Planning: Goal: Ability to manage health-related needs will improve Outcome: Progressing   Problem: Clinical Measurements: Goal: Ability to maintain clinical measurements within normal limits will improve Outcome: Progressing Goal: Will remain free from infection Outcome: Progressing Goal: Diagnostic test results will improve Outcome: Progressing Goal: Respiratory complications will improve Outcome: Progressing Goal: Cardiovascular complication will be avoided Outcome: Progressing   Problem: Activity: Goal: Risk for activity intolerance will decrease Outcome: Progressing   Problem: Nutrition: Goal: Adequate nutrition will be maintained Outcome: Progressing   Problem: Coping: Goal: Level of anxiety will decrease Outcome: Progressing   Problem: Elimination: Goal: Will not experience complications related to bowel motility Outcome: Progressing Goal: Will not experience complications related to urinary retention Outcome: Progressing   Problem: Pain Managment: Goal: General experience of comfort will improve Outcome: Progressing   Problem: Safety: Goal: Ability to remain free from injury will improve Outcome: Progressing   Problem: Skin Integrity: Goal: Risk for impaired skin integrity will decrease Outcome: Progressing   Problem: Education: Goal: Knowledge of risk factors and measures for prevention of condition will improve Outcome: Progressing   Problem: Coping: Goal: Psychosocial and spiritual needs will be supported Outcome: Progressing   Problem: Respiratory: Goal: Will maintain a patent airway Outcome: Progressing Goal: Complications related to the disease process, condition or treatment will be avoided or  minimized Outcome: Progressing   Problem: Education: Goal: Knowledge of disease or condition will improve Outcome: Progressing Goal: Knowledge of the prescribed therapeutic regimen will improve Outcome: Progressing Goal: Individualized Educational Video(s) Outcome: Progressing   Problem: Activity: Goal: Ability to tolerate increased activity will improve Outcome: Progressing Goal: Will verbalize the importance of balancing activity with adequate rest periods Outcome: Progressing   Problem: Respiratory: Goal: Ability to maintain a clear airway will improve Outcome: Progressing Goal: Levels of oxygenation will improve Outcome: Progressing Goal: Ability to maintain adequate ventilation will improve Outcome: Progressing

## 2021-04-18 DIAGNOSIS — U071 COVID-19: Secondary | ICD-10-CM | POA: Diagnosis not present

## 2021-04-18 LAB — COMPREHENSIVE METABOLIC PANEL
ALT: 22 U/L (ref 0–44)
AST: 29 U/L (ref 15–41)
Albumin: 2.6 g/dL — ABNORMAL LOW (ref 3.5–5.0)
Alkaline Phosphatase: 42 U/L (ref 38–126)
Anion gap: 7 (ref 5–15)
BUN: 30 mg/dL — ABNORMAL HIGH (ref 8–23)
CO2: 27 mmol/L (ref 22–32)
Calcium: 8.4 mg/dL — ABNORMAL LOW (ref 8.9–10.3)
Chloride: 100 mmol/L (ref 98–111)
Creatinine, Ser: 1.16 mg/dL — ABNORMAL HIGH (ref 0.44–1.00)
GFR, Estimated: 48 mL/min — ABNORMAL LOW (ref >60)
Glucose, Bld: 167 mg/dL — ABNORMAL HIGH (ref 70–99)
Potassium: 5 mmol/L (ref 3.5–5.1)
Sodium: 134 mmol/L — ABNORMAL LOW (ref 135–145)
Total Bilirubin: 0.6 mg/dL (ref 0.3–1.2)
Total Protein: 6.1 g/dL — ABNORMAL LOW (ref 6.5–8.1)

## 2021-04-18 LAB — CBC WITH DIFFERENTIAL/PLATELET
Abs Immature Granulocytes: 0 10*3/uL (ref 0.00–0.07)
Basophils Absolute: 0 10*3/uL (ref 0.0–0.1)
Basophils Relative: 0 %
Eosinophils Absolute: 0.1 10*3/uL (ref 0.0–0.5)
Eosinophils Relative: 1 %
HCT: 46.4 % — ABNORMAL HIGH (ref 36.0–46.0)
Hemoglobin: 14.8 g/dL (ref 12.0–15.0)
Lymphocytes Relative: 18 %
Lymphs Abs: 0.9 10*3/uL (ref 0.7–4.0)
MCH: 30.3 pg (ref 26.0–34.0)
MCHC: 31.9 g/dL (ref 30.0–36.0)
MCV: 94.9 fL (ref 80.0–100.0)
Monocytes Absolute: 0.7 10*3/uL (ref 0.1–1.0)
Monocytes Relative: 14 %
Neutro Abs: 3.4 10*3/uL (ref 1.7–7.7)
Neutrophils Relative %: 67 %
Platelets: 170 10*3/uL (ref 150–400)
RBC: 4.89 MIL/uL (ref 3.87–5.11)
RDW: 14.8 % (ref 11.5–15.5)
WBC: 5 10*3/uL (ref 4.0–10.5)
nRBC: 0 % (ref 0.0–0.2)
nRBC: 0 /100 WBC

## 2021-04-18 LAB — PROCALCITONIN: Procalcitonin: <0.1 ng/mL

## 2021-04-18 LAB — GLUCOSE, CAPILLARY
Glucose-Capillary: 150 mg/dL — ABNORMAL HIGH (ref 70–99)
Glucose-Capillary: 192 mg/dL — ABNORMAL HIGH (ref 70–99)
Glucose-Capillary: 135 mg/dL — ABNORMAL HIGH (ref 70–99)
Glucose-Capillary: 263 mg/dL — ABNORMAL HIGH (ref 70–99)

## 2021-04-18 LAB — MAGNESIUM: Magnesium: 2.3 mg/dL (ref 1.7–2.4)

## 2021-04-18 LAB — D-DIMER, QUANTITATIVE: D-Dimer, Quant: 0.35 ug/mL-FEU (ref 0.00–0.50)

## 2021-04-18 LAB — TSH: TSH: 1.188 u[IU]/mL (ref 0.350–4.500)

## 2021-04-18 LAB — BRAIN NATRIURETIC PEPTIDE: B Natriuretic Peptide: 96.7 pg/mL (ref 0.0–100.0)

## 2021-04-18 LAB — C-REACTIVE PROTEIN: CRP: 1.1 mg/dL — ABNORMAL HIGH (ref <1.0)

## 2021-04-18 MED ORDER — FUROSEMIDE 10 MG/ML IJ SOLN
20.0000 mg | Freq: Once | INTRAMUSCULAR | Status: AC
  Administered 2021-04-18: 20 mg via INTRAVENOUS
  Filled 2021-04-18: qty 2

## 2021-04-18 NOTE — Progress Notes (Signed)
Physical Therapy Treatment Patient Details Name: Heidi Stark MRN: [id number] DOB: 1941/05/29 Today's Date: 04/18/2021   History of Present Illness 80 y.o. female who presented to ED 04/15/21 with shortness of breath. +COVID   PMH significant of PAF, hypertension, hyperlipidemia, COPD, systolic heart failure, type 2 diabetes, OSA on CPAP, history of DVT, CKD stage III, hx of VT s/p ICD placement    PT Comments    Patient limited to ambulating 45 ft at a time due to fatigue. Was able to sit and rest and the repeat. Unable to get accurate sat measure due to poor pleth when mobilizing. On 4L and once seated/resting, pleth would improve and sats were 92%.     Recommendations for follow up therapy are one component of a multi-disciplinary discharge planning process, led by the attending physician.  Recommendations may be updated based on patient status, additional functional criteria and insurance authorization.  Follow Up Recommendations  No PT follow up     Equipment Recommendations  None recommended by PT    Recommendations for Other Services       Precautions / Restrictions Precautions Precautions: Fall     Mobility  Bed Mobility               General bed mobility comments: up in chair    Transfers Overall transfer level: Needs assistance Equipment used: Rolling walker (2 wheeled) Transfers: Sit to/from [facility] Sit to Stand: Supervision         General transfer comment: no cues needed from recliner, however cues needed for sequencing from [facility] for safe hand placement with RW  Ambulation/Gait Ambulation/Gait assistance: Min guard Gait Distance (Feet): 45 Feet (toileted; 45 ft) Assistive device: Rolling walker (2 wheeled) Gait Pattern/deviations: Step-through pattern;Decreased stride length   Gait velocity interpretation: <1.8 ft/sec, indicate of risk for recurrent falls General Gait Details: steady with RW (pt's preference). Did well  negotiating around obstacles in room. Poor pleth for pulse ox while ambulating. Once seated and reading, sats 92% on 4L   Stairs             Wheelchair Mobility    Modified Rankin (Stroke Patients Only)       Balance Overall balance assessment: Needs assistance Sitting-balance support: Feet supported Sitting balance-Leahy Scale: Fair     Standing balance support: Bilateral upper extremity supported Standing balance-Leahy Scale: Poor Standing balance comment: needing external support                            Cognition Arousal/Alertness: Awake/alert Behavior During Therapy: Flat affect Overall Cognitive Status: Within Functional Limits for tasks assessed                                        Exercises Other Exercises Other Exercises: Reports she continues to use IS and flutter valve    General Comments        Pertinent Vitals/Pain Pain Assessment: No/denies pain    Home Living                      Prior Function            PT Goals (current goals can now be found in the care plan section) Acute Rehab PT Goals Patient Stated Goal: Return home PT Goal Formulation: With patient Time For Goal Achievement: 04/30/21  Potential to Achieve Goals: Good Progress towards PT goals: Progressing toward goals    Frequency    Min 3X/week      PT Plan Current plan remains appropriate    Co-evaluation              AM-PAC PT "6 Clicks" Mobility   Outcome Measure  Help needed turning from your back to your side while in a flat bed without using bedrails?: None Help needed moving from lying on your back to sitting on the side of a flat bed without using bedrails?: A Little Help needed moving to and from a bed to a chair (including a wheelchair)?: A Little Help needed standing up from a chair using your arms (e.g., wheelchair or bedside chair)?: A Little Help needed to walk in hospital room?: A Little Help needed  climbing 3-5 steps with a railing? : A Little 6 Click Score: 19    End of Session Equipment Utilized During Treatment: Oxygen Activity Tolerance: Patient limited by fatigue Patient left: in chair;with call bell/phone within reach   PT Visit Diagnosis: Difficulty in walking, not elsewhere classified (R26.2)     Time: 1000-1019 PT Time Calculation (min) (ACUTE ONLY): 19 min  Charges:  $Gait Training: 8-22 mins                      Arby Barrette, PT Pager [phone]    Rexanne Mano 04/18/2021, 11:30 AM

## 2021-04-18 NOTE — Progress Notes (Signed)
PROGRESS NOTE                                                                                                                                                                                                             Patient Demographics:    Heidi Stark, is a 80 y.o. female, DOB - 1940/12/19, QPY:[id number]  Outpatient Primary MD for the patient is [name], [name], DO    LOS - 3  Admit date - 04/15/2021    Chief Complaint  Patient presents with   Shortness of Breath       Brief Narrative (HPI from H&P)    Heidi Stark is a 80 y.o. 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  Of note she is not vaccinated.   Subjective:   Patient in bed, appears comfortable, denies any headache, no fever, no chest pain or pressure,  much better shortness of breath , no abdominal pain. No new focal weakness.    Assessment  & Plan :      Acute Hypoxic Resp. Failure due to Acute Covid 19 Viral Pneumonitis during the ongoing 2020 Covid 19 Pandemic - she seems to have moderate to severe respiratory involvement, currently on 5 L nasal cannula oxygen, has been placed on steroids and Remdesivir which will be continued.  She has consented for Actemra use if she declines further.  Continue to monitor closely trend inflammatory markers.  Stopped azithromycin.  Encouraged the patient to sit up in chair in the daytime use I-S and flutter valve for pulmonary toiletry.  Will advance activity and titrate down  oxygen as possible.  Actemra/Baricitinib  off label use - patient was told that if COVID-19 pneumonitis gets worse we might potentially use Actemra off label, patient denies any known history of active diverticulitis, tuberculosis or hepatitis, understands the risks and benefits and wants to proceed with Actemra treatment if required.   Recent Labs  Lab 04/15/21 0224 04/15/21  0234 04/15/21 1044 04/15/21 1430 04/16/21 0047 04/17/21 0116 04/18/21 0059  WBC  --  8.6  --   --  4.8 4.5 5.0  HGB  --  16.2*  --   --  15.7* 14.7 14.8  HCT  --  48.9*  --   --  46.5* 45.2 46.4*  PLT  --  128*  --   --  117* 142* 170  CRP  --   --   --  3.7* 4.2* 2.7* 1.1*  BNP  --   --  47.4  --  72.0 94.3 96.7  DDIMER  --   --   --  0.49 0.51* 0.48 0.35  PROCALCITON  --   --   --   --  <0.10 <0.10 <0.10  AST  --   --   --  42* 41 34 29  ALT  --   --   --  23 26 25 22   ALKPHOS  --   --   --  44 52 42 42  BILITOT  --   --   --  0.8 0.6 0.4 0.6  ALBUMIN  --   --   --  3.0* 3.0* 2.7* 2.6*  SARSCOV2NAA POSITIVE*  --   --   --   --   --   --     2.  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  Continue Entresto at half home dose, home dose anticoagulation continued, beta-blocker held due to episodes of bradycardia, case discussed with Dr. Peter Martinique, EP on-call, home dose amiodarone continued as she has history of V. tach, monitor on telemetry, she is stable and asymptomatic, stable TSH.  3.  History of chronic diastolic heart failure EF 50%, VT.  She has a single chamber MDT ICD.  Was dehydrated and hypotensive, has been gently hydrated on 04/16/2021, received low-dose Lasix, hold beta-blocker as she is bradycardic, Entresto half home dose and monitor.  4. Underlying history of COPD as needed 2 L of oxygen at home.  Supportive care for  5.  OSA.  CPAP at night.  6. History  of morbid obesity.  BMI 42.  Follow with PCP.  7.  Dehydration with hyponatremia.  Much improved post IVF, now resumed Lasix dosing daily.    8.  Hyperlipidemia.  As in #7 above, urine electrolytes along with serum osmolality ordered.  9.  Mild elevation in troponin due to demand ischemia from hypoxia.  No chest pain or acute ST changes.  No further work-up.  10.  V. tach in the past.  Has AICD.  Continue amiodarone for now.  11.  Dyslipidemia.  On statin continue.  12.  DM type II.  On 70/30, Premeal NovoLog and sliding scale, dose adjsuted.  Lab Results  Component Value Date   HGBA1C 9.0 (H) 04/15/2021    CBG (last 3)  Recent Labs    04/17/21 1543 04/17/21 2206 04/18/21 0800  GLUCAP 164* 235* 150*   Lab Results  Component Value Date   TSH 1.188 04/18/2021         Condition - Extremely Guarded  Family Communication  :  daughter Debroah Baller [phone] on 04/16/21  Code Status :  Full  Consults  :  None  PUD Prophylaxis :    Procedures  :  Disposition Plan  :    Status is: Inpatient  Remains inpatient appropriate because:IV treatments appropriate due to intensity of illness or inability to take PO  Dispo: The patient is from: Home              Anticipated d/c is to: Home              Patient currently is not medically stable to d/c.   Difficult to place patient Yes   DVT Prophylaxis  :   apixaban (ELIQUIS) tablet 5 mg      Lab Results  Component Value Date   PLT 170 04/18/2021    Diet :  Diet Order             Diet heart healthy/carb modified Room service appropriate? Yes; Fluid consistency: Thin  Diet effective now                    Inpatient Medications  Scheduled Meds:  amiodarone  200 mg Oral Daily   apixaban  5 mg Oral BID   vitamin C  500 mg Oral Daily   dapagliflozin propanediol  5 mg Oral Daily   dexamethasone (DECADRON) injection  6 mg Intravenous Q24H   fenofibrate  160 mg Oral Daily   furosemide  20 mg  Intravenous Once   furosemide  40 mg Oral Daily   icosapent Ethyl  2 g Oral BID   insulin aspart  0-15 Units Subcutaneous TID WC   insulin aspart  0-5 Units Subcutaneous QHS   insulin aspart  4 Units Subcutaneous TID WC   insulin aspart protamine- aspart  35 Units Subcutaneous BID WC   Ipratropium-Albuterol  1 puff Inhalation Q6H   mouth rinse  15 mL Mouth Rinse BID   melatonin  5 mg Oral QHS   multivitamin with minerals  1 tablet Oral Daily   rosuvastatin  20 mg Oral Daily   sacubitril-valsartan  1 tablet Oral BID   sodium chloride flush  3 mL Intravenous Q12H   zinc sulfate  220 mg Oral Daily   Continuous Infusions:  remdesivir 100 mg in NS 100 mL Stopped (04/18/21 0944)   PRN Meds:.acetaminophen, albuterol, guaiFENesin-dextromethorphan  Antibiotics  :    Anti-infectives (From admission, onward)    Start     Dose/Rate Route Frequency Ordered Stop   04/16/21 1445  azithromycin (ZITHROMAX) tablet 500 mg  Status:  Discontinued       See Hyperspace for full Linked Orders Report.   500 mg Oral Daily 04/15/21 1437 04/16/21 1035   04/16/21 1000  remdesivir 100 mg in sodium chloride 0.9 % 100 mL IVPB       See Hyperspace for full Linked Orders Report.   100 mg 200 mL/hr over 30 Minutes Intravenous Daily 04/15/21 1324 04/20/21 0959   04/16/21 1000  remdesivir 100 mg in sodium chloride 0.9 % 100 mL IVPB  Status:  Discontinued       See Hyperspace for full Linked Orders Report.   100 mg 200 mL/hr over 30 Minutes Intravenous Daily 04/15/21 1437 04/15/21 1438   04/15/21 1500  remdesivir 200 mg in sodium chloride 0.9% 250 mL IVPB       See Hyperspace for full Linked Orders Report.   200 mg 580 mL/hr over 30 Minutes Intravenous Once 04/15/21 1324 04/15/21 1722   04/15/21 1445  azithromycin (ZITHROMAX) 500 mg in sodium chloride 0.9 % 250 mL IVPB       See Hyperspace for  full Linked Orders Report.   500 mg 250 mL/hr over 60 Minutes Intravenous Every 24 hours 04/15/21 1437 04/15/21 1737    04/15/21 1445  remdesivir 200 mg in sodium chloride 0.9% 250 mL IVPB  Status:  Discontinued       See Hyperspace for full Linked Orders Report.   200 mg 580 mL/hr over 30 Minutes Intravenous Once 04/15/21 1437 04/15/21 1438        Time Spent in minutes  30   Lala Lund M.D on 04/18/2021 at 10:51 AM  To page go to [email or web address]   [facility] -  Office  [phone]      See all Orders from today for further details    Objective:   Vitals:   04/17/21 2325 04/18/21 0340 04/18/21 0638 04/18/21 0801  BP: (!) 117/54 103/60  (!) 142/77  Pulse: (!) 50 (!) 50 (!) 49 (!) 56  Resp: 11 16 14 17   Temp: 98.9 F (37.2 C) 98.3 F (36.8 C)  (!) 97.5 F (36.4 C)  TempSrc: Axillary Axillary  Oral  SpO2: 93% 91% 93% 93%  Weight:      Height:        Wt Readings from Last 3 Encounters:  04/16/21 120.2 kg  01/27/21 120.5 kg  12/20/20 119.3 kg     Intake/Output Summary (Last 24 hours) at 04/18/2021 1051 Last data filed at 04/18/2021 0944 Gross per 24 hour  Intake 340 ml  Output 800 ml  Net -460 ml     Physical Exam  Awake Alert, No new F.N deficits, Normal affect [place].AT,PERRAL Supple Neck,No JVD, No cervical lymphadenopathy appriciated.  Symmetrical Chest wall movement, Good air movement bilaterally, few rales RRR,No Gallops, Rubs or new Murmurs, No Parasternal Heave +ve B.Sounds, Abd Soft, No tenderness, No organomegaly appriciated, No rebound - guarding or rigidity. No Cyanosis, Clubbing or edema, No new Rash or bruise     Data Review:    CBC Recent Labs  Lab 04/15/21 0234 04/16/21 0047 04/17/21 0116 04/18/21 0059  WBC 8.6 4.8 4.5 5.0  HGB 16.2* 15.7* 14.7 14.8  HCT 48.9* 46.5* 45.2 46.4*  PLT 128* 117* 142* 170  MCV 94.6 92.8 94.4 94.9  MCH 31.3 31.3 30.7 30.3  MCHC 33.1 33.8 32.5 31.9  RDW 15.2 15.2 15.0 14.8  LYMPHSABS 2.8 1.3 0.9 0.9  MONOABS 0.8 0.2 0.4 0.7  EOSABS 0.0 0.0 0.0 0.1  BASOSABS 0.0 0.0 0.0 0.0    Recent Labs  Lab  04/15/21 0234 04/15/21 1044 04/15/21 1430 04/15/21 1440 04/16/21 0047 04/17/21 0116 04/18/21 0059  NA 133*  --   --   --  131* 133* 134*  K 3.9  --   --   --  4.2 4.8 5.0  CL 96*  --   --   --  96* 97* 100  CO2 24  --   --   --  23 24 27   GLUCOSE 172*  --   --   --  325* 212* 167*  BUN 17  --   --   --  26* 34* 30*  CREATININE 1.34*  --   --   --  1.33* 1.36* 1.16*  CALCIUM 8.2*  --   --   --  8.2* 8.7* 8.4*  AST  --   --  42*  --  41 34 29  ALT  --   --  23  --  26 25 22   ALKPHOS  --   --  44  --  52 42 42  BILITOT  --   --  0.8  --  0.6 0.4 0.6  ALBUMIN  --   --  3.0*  --  3.0* 2.7* 2.6*  MG  --   --  2.3  --  2.3 2.2 2.3  CRP  --   --  3.7*  --  4.2* 2.7* 1.1*  DDIMER  --   --  0.49  --  0.51* 0.48 0.35  PROCALCITON  --   --   --   --  <0.10 <0.10 <0.10  TSH  --   --   --   --   --   --  1.188  HGBA1C  --   --   --  9.0*  --   --   --   BNP  --  47.4  --   --  72.0 94.3 96.7    ------------------------------------------------------------------------------------------------------------------ No results for input(s): CHOL, HDL, LDLCALC, TRIG, CHOLHDL, LDLDIRECT in the last 72 hours.  Lab Results  Component Value Date   HGBA1C 9.0 (H) 04/15/2021   ------------------------------------------------------------------------------------------------------------------ Recent Labs    04/18/21 0059  TSH 1.188    Cardiac Enzymes No results for input(s): CKMB, TROPONINI, MYOGLOBIN in the last 168 hours.  Invalid input(s): CK ------------------------------------------------------------------------------------------------------------------    Component Value Date/Time   BNP 96.7 04/18/2021 0059     Radiology Reports DG Chest 2 View  Result Date: 04/15/2021 CLINICAL DATA:  Shortness of breath x2 days. EXAM: CHEST - 2 VIEW COMPARISON:  September 16, 2020 FINDINGS: A single lead ventricular pacer is noted. Mild, diffuse, chronic appearing increased interstitial lung markings are  noted. There is no evidence of acute infiltrate, pleural effusion or pneumothorax. The cardiac silhouette is moderately enlarged and unchanged in size. The visualized skeletal structures are unremarkable. IMPRESSION: Stable cardiomegaly with chronic appearing increased interstitial lung markings. Electronically Signed   By: Virgina Norfolk M.D.   On: 04/15/2021 03:12   DG Chest Port 1 View  Result Date: 04/17/2021 CLINICAL DATA:  Shortness of breath EXAM: PORTABLE CHEST 1 VIEW COMPARISON:  April 16, 2021 FINDINGS: Stable cardiomegaly. Stable AICD device. The hila and mediastinum are unremarkable. No pneumothorax. Diffuse mild increased interstitial opacities. More focal mild opacities in the bases. No other acute abnormalities. IMPRESSION: 1. Findings are most suggestive of cardiomegaly with mild edema. The more focal opacity in the right base may represent atelectasis or developing infiltrate. Recommend attention on follow-up. Electronically Signed   By: Dorise Bullion III M.D.   On: 04/17/2021 07:44   DG Chest Port 1 View  Result Date: 04/16/2021 CLINICAL DATA:  Shortness of breath EXAM: PORTABLE CHEST 1 VIEW COMPARISON:  04/16/2019 FINDINGS: Cardiomegaly. Single chamber pacer lead into the right ventricle. Artifact from EKG leads. Mild interstitial coarsening. Airway thickening and paraseptal emphysema by 2018 chest CT. IMPRESSION: Baseline appearance of the chest.  No acute finding. Electronically Signed   By: Jorje Guild M.D.   On: 04/16/2021 07:37

## 2021-04-18 NOTE — Progress Notes (Signed)
Inpatient Diabetes Program Recommendations  AACE/ADA: New Consensus Statement on Inpatient Glycemic Control (2015)  Target Ranges:  Prepandial:   less than 140 mg/dL      Peak postprandial:   less than 180 mg/dL (1-2 hours)      Critically ill patients:  140 - 180 mg/dL   Lab Results  Component Value Date   GLUCAP 263 (H) 04/18/2021   HGBA1C 9.0 (H) 04/15/2021    Review of Glycemic Control  Diabetes history: DM2 Outpatient Diabetes medications: Humalog 75/25 40 in am and 30 in pm, Farxiga 5 mg QD Current orders for Inpatient glycemic control: 70/30 35 units BID, Novolog 0-15 TID + 4 units TID, Farxiga 5 mg QD  Inpatient Diabetes Program Recommendations:    Increase 70/30 to 40 units QAM and continue 70/30 35 units Q PM. D/C Novolog 4 units TID (meal coverage is in 70/30 insulin)  Will speak with pt about HgbA1C of 9% prior to discharge  Continue to follow.   Thank you. Lorenda Peck, RD, LDN, CDE Inpatient Diabetes Coordinator [phone]

## 2021-04-19 ENCOUNTER — Encounter (HOSPITAL_COMMUNITY): Payer: Self-pay

## 2021-04-19 DIAGNOSIS — U071 COVID-19: Secondary | ICD-10-CM | POA: Diagnosis not present

## 2021-04-19 LAB — BRAIN NATRIURETIC PEPTIDE: B Natriuretic Peptide: 84.5 pg/mL (ref 0.0–100.0)

## 2021-04-19 LAB — COMPREHENSIVE METABOLIC PANEL
ALT: 24 U/L (ref 0–44)
AST: 30 U/L (ref 15–41)
Albumin: 2.7 g/dL — ABNORMAL LOW (ref 3.5–5.0)
Alkaline Phosphatase: 46 U/L (ref 38–126)
Anion gap: 10 (ref 5–15)
BUN: 30 mg/dL — ABNORMAL HIGH (ref 8–23)
CO2: 26 mmol/L (ref 22–32)
Calcium: 8.5 mg/dL — ABNORMAL LOW (ref 8.9–10.3)
Chloride: 98 mmol/L (ref 98–111)
Creatinine, Ser: 0.96 mg/dL (ref 0.44–1.00)
GFR, Estimated: >60 mL/min (ref >60)
Glucose, Bld: 123 mg/dL — ABNORMAL HIGH (ref 70–99)
Potassium: 4.4 mmol/L (ref 3.5–5.1)
Sodium: 134 mmol/L — ABNORMAL LOW (ref 135–145)
Total Bilirubin: 0.5 mg/dL (ref 0.3–1.2)
Total Protein: 6.4 g/dL — ABNORMAL LOW (ref 6.5–8.1)

## 2021-04-19 LAB — CBC WITH DIFFERENTIAL/PLATELET
Abs Immature Granulocytes: 0 10*3/uL (ref 0.00–0.07)
Basophils Absolute: 0 10*3/uL (ref 0.0–0.1)
Basophils Relative: 0 %
Eosinophils Absolute: 0.1 10*3/uL (ref 0.0–0.5)
Eosinophils Relative: 1 %
HCT: 46.6 % — ABNORMAL HIGH (ref 36.0–46.0)
Hemoglobin: 15.4 g/dL — ABNORMAL HIGH (ref 12.0–15.0)
Lymphocytes Relative: 17 %
Lymphs Abs: 0.9 10*3/uL (ref 0.7–4.0)
MCH: 30.8 pg (ref 26.0–34.0)
MCHC: 33 g/dL (ref 30.0–36.0)
MCV: 93.2 fL (ref 80.0–100.0)
Monocytes Absolute: 0.5 10*3/uL (ref 0.1–1.0)
Monocytes Relative: 9 %
Neutro Abs: 3.7 10*3/uL (ref 1.7–7.7)
Neutrophils Relative %: 73 %
Platelets: 189 10*3/uL (ref 150–400)
RBC: 5 MIL/uL (ref 3.87–5.11)
RDW: 14.8 % (ref 11.5–15.5)
WBC: 5.1 10*3/uL (ref 4.0–10.5)
nRBC: 0 % (ref 0.0–0.2)
nRBC: 0 /100 WBC

## 2021-04-19 LAB — GLUCOSE, CAPILLARY
Glucose-Capillary: 244 mg/dL — ABNORMAL HIGH (ref 70–99)
Glucose-Capillary: 91 mg/dL (ref 70–99)
Glucose-Capillary: 108 mg/dL — ABNORMAL HIGH (ref 70–99)
Glucose-Capillary: 163 mg/dL — ABNORMAL HIGH (ref 70–99)

## 2021-04-19 LAB — PROCALCITONIN: Procalcitonin: <0.1 ng/mL

## 2021-04-19 LAB — MAGNESIUM: Magnesium: 2.3 mg/dL (ref 1.7–2.4)

## 2021-04-19 LAB — D-DIMER, QUANTITATIVE: D-Dimer, Quant: 0.37 ug/mL-FEU (ref 0.00–0.50)

## 2021-04-19 LAB — C-REACTIVE PROTEIN: CRP: 0.7 mg/dL (ref <1.0)

## 2021-04-19 MED ORDER — FUROSEMIDE 10 MG/ML IJ SOLN
60.0000 mg | Freq: Once | INTRAMUSCULAR | Status: AC
  Administered 2021-04-19: 60 mg via INTRAVENOUS
  Filled 2021-04-19: qty 6

## 2021-04-19 MED ORDER — IPRATROPIUM-ALBUTEROL 20-100 MCG/ACT IN AERS
1.0000 | INHALATION_SPRAY | Freq: Three times a day (TID) | RESPIRATORY_TRACT | Status: DC
  Administered 2021-04-20 – 2021-04-23 (×11): 1 via RESPIRATORY_TRACT
  Filled 2021-04-19: qty 4

## 2021-04-19 MED ORDER — DEXAMETHASONE SODIUM PHOSPHATE 4 MG/ML IJ SOLN
2.0000 mg | INTRAMUSCULAR | Status: AC
  Administered 2021-04-19: 2 mg via INTRAVENOUS
  Filled 2021-04-19: qty 1

## 2021-04-19 MED ORDER — AZITHROMYCIN 500 MG PO TABS
500.0000 mg | ORAL_TABLET | Freq: Every day | ORAL | Status: DC
  Administered 2021-04-19 – 2021-04-22 (×4): 500 mg via ORAL
  Filled 2021-04-19 (×4): qty 1

## 2021-04-19 MED ORDER — FUROSEMIDE 40 MG PO TABS
80.0000 mg | ORAL_TABLET | Freq: Every day | ORAL | Status: DC
  Administered 2021-04-20 – 2021-04-21 (×2): 80 mg via ORAL
  Filled 2021-04-19 (×2): qty 2

## 2021-04-19 MED ORDER — FUROSEMIDE 10 MG/ML IJ SOLN: 40.0000 mg | Freq: Once | INTRAMUSCULAR | Status: DC

## 2021-04-19 MED ORDER — SACUBITRIL-VALSARTAN 24-26 MG PO TABS
1.0000 | ORAL_TABLET | Freq: Two times a day (BID) | ORAL | Status: DC
  Administered 2021-04-20 – 2021-04-23 (×7): 1 via ORAL
  Filled 2021-04-19 (×8): qty 1

## 2021-04-19 NOTE — Plan of Care (Signed)
  Problem: Education: Goal: Knowledge of General Education information will improve Description Including pain rating scale, medication(s)/side effects and non-pharmacologic comfort measures Outcome: Progressing   

## 2021-04-19 NOTE — Progress Notes (Signed)
PROGRESS NOTE                                                                                                                                                                                                             Patient Demographics:    Heidi Stark, is a 80 y.o. female, DOB - 1941-06-20, KAJ:[id number]  Outpatient Primary MD for the patient is Ann Held, DO    LOS - 4  Admit date - 04/15/2021    Chief Complaint  Patient presents with   Shortness of Breath       Brief Narrative (HPI from H&P)    Heidi Stark is a 80 y.o. 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  Of note she is not vaccinated.   Subjective:   Patient in bed, appears comfortable, denies any headache, no fever, no chest pain or pressure, mild orthopnea and shortness of breath , today she has a productive cough with yellowish phlegm,  no abdominal pain. No new focal weakness.   Assessment  & Plan :      Acute Hypoxic Resp. Failure due to Acute Covid 19 Viral Pneumonitis during the ongoing 2020 Covid 19 Pandemic - she seems to have moderate to severe respiratory involvement, currently on 5 L nasal cannula oxygen, has been placed on steroids and Remdesivir much better, titrate down o2, uses PRN o2 at home, taper down steroids, finish Remdesivir course, Lasix for #3 - CHF, new yellow phlegm - sputum gram stain and culture, PO Azithro.  Encouraged the patient to sit up in chair in the daytime use I-S and  flutter valve for pulmonary toiletry.  Will advance activity and titrate down oxygen as possible.   Recent Labs  Lab 04/15/21 0224 04/15/21 0234 04/15/21 1044 04/15/21 1430 04/16/21 0047 04/17/21 0116 04/18/21 0059 04/19/21 0135  WBC  --  8.6  --   --  4.8 4.5 5.0 5.1  HGB  --  16.2*  --   --  15.7* 14.7 14.8 15.4*  HCT  --  48.9*  --   --  46.5* 45.2 46.4* 46.6*  PLT  --  128*  --   --  117* 142* 170 189  CRP  --   --   --  3.7* 4.2* 2.7* 1.1* 0.7  BNP  --   --  47.4  --  72.0 94.3 96.7 84.5  DDIMER  --   --   --  0.49 0.51* 0.48 0.35 0.37  PROCALCITON  --   --   --   --  <0.10 <0.10 <0.10  --   AST  --   --   --  42* 41 34 29 30  ALT  --   --   --  23 26 25 22 24   ALKPHOS  --   --   --  44 52 42 42 46  BILITOT  --   --   --  0.8 0.6 0.4 0.6 0.5  ALBUMIN  --   --   --  3.0* 3.0* 2.7* 2.6* 2.7*  SARSCOV2NAA POSITIVE*  --   --   --   --   --   --   --     2.  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  Continue Entresto at half home dose, home dose anticoagulation continued, beta-blocker held due to episodes of bradycardia, case discussed with Dr. Peter Martinique, EP on-call, home dose amiodarone continued as she has history of V. tach, monitor on telemetry, she is stable and asymptomatic, stable TSH.  3.  Acute on chronic diastolic heart failure EF 50%, VT.  She has a single chamber MDT ICD.  Was dehydrated and hypotensive, now slight overload, Lasix now,  DC beta-blocker as she is bradycardic, Entresto half home dose and monitor.  4. Underlying history of COPD as needed 2 L of oxygen at home.  Supportive care for  5.  OSA.  CPAP at night.  6. History of morbid obesity.  BMI 42.  Follow with PCP.  7.  Dehydration with hyponatremia.  resolved    8.  Hyperlipidemia.  As in #7 above, urine electrolytes along with serum osmolality ordered.  9.   Mild elevation in troponin due to demand ischemia from hypoxia.  No chest pain or acute ST changes.  No further work-up.  10.  V. tach in the past.  Has AICD.  Continue amiodarone for now.  11.  Dyslipidemia.  On statin continue.  12.  DM type II.  On 70/30, Premeal NovoLog and sliding scale, dose adjsuted. Steroids being tapered.  Lab Results  Component Value Date   HGBA1C 9.0 (H) 04/15/2021    CBG (last 3)  Recent Labs    04/18/21 1631 04/18/21 2114 04/19/21 0803  GLUCAP 135* 263* 244*   Lab Results  Component Value Date   TSH 1.188 04/18/2021         Condition - Extremely Guarded  Family Communication  :  daughter Debroah Baller [phone] on 04/16/21  Code Status :  Full  Consults  :  None  PUD Prophylaxis :    Procedures  :            Disposition Plan  :    Status is: Inpatient  Remains inpatient appropriate because:IV treatments appropriate due to intensity of illness  or inability to take PO  Dispo: The patient is from: Home              Anticipated d/c is to: Home              Patient currently is not medically stable to d/c.   Difficult to place patient Yes   DVT Prophylaxis  :   apixaban (ELIQUIS) tablet 5 mg      Lab Results  Component Value Date   PLT 189 04/19/2021    Diet :  Diet Order             Diet heart healthy/carb modified Room service appropriate? Yes; Fluid consistency: Thin  Diet effective now                    Inpatient Medications  Scheduled Meds:  amiodarone  200 mg Oral Daily   apixaban  5 mg Oral BID   vitamin C  500 mg Oral Daily   dapagliflozin propanediol  5 mg Oral Daily   dexamethasone (DECADRON) injection  2 mg Intravenous Q24H   fenofibrate  160 mg Oral Daily   furosemide  60 mg Intravenous Once   [START ON 04/20/2021] furosemide  80 mg Oral Daily   icosapent Ethyl  2 g Oral BID   insulin aspart  0-15 Units Subcutaneous TID WC   insulin aspart  0-5 Units Subcutaneous QHS   insulin aspart  4 Units  Subcutaneous TID WC   insulin aspart protamine- aspart  35 Units Subcutaneous BID WC   Ipratropium-Albuterol  1 puff Inhalation Q6H   mouth rinse  15 mL Mouth Rinse BID   melatonin  5 mg Oral QHS   multivitamin with minerals  1 tablet Oral Daily   rosuvastatin  20 mg Oral Daily   sacubitril-valsartan  1 tablet Oral BID   sodium chloride flush  3 mL Intravenous Q12H   zinc sulfate  220 mg Oral Daily   Continuous Infusions:   PRN Meds:.acetaminophen, albuterol, guaiFENesin-dextromethorphan  Antibiotics  :    Anti-infectives (From admission, onward)    Start     Dose/Rate Route Frequency Ordered Stop   04/16/21 1445  azithromycin (ZITHROMAX) tablet 500 mg  Status:  Discontinued       See Hyperspace for full Linked Orders Report.   500 mg Oral Daily 04/15/21 1437 04/16/21 1035   04/16/21 1000  remdesivir 100 mg in sodium chloride 0.9 % 100 mL IVPB       See Hyperspace for full Linked Orders Report.   100 mg 200 mL/hr over 30 Minutes Intravenous Daily 04/15/21 1324 04/19/21 0958   04/16/21 1000  remdesivir 100 mg in sodium chloride 0.9 % 100 mL IVPB  Status:  Discontinued       See Hyperspace for full Linked Orders Report.   100 mg 200 mL/hr over 30 Minutes Intravenous Daily 04/15/21 1437 04/15/21 1438   04/15/21 1500  remdesivir 200 mg in sodium chloride 0.9% 250 mL IVPB       See Hyperspace for full Linked Orders Report.   200 mg 580 mL/hr over 30 Minutes Intravenous Once 04/15/21 1324 04/15/21 1722   04/15/21 1445  azithromycin (ZITHROMAX) 500 mg in sodium chloride 0.9 % 250 mL IVPB       See Hyperspace for full Linked Orders Report.   500 mg 250 mL/hr over 60 Minutes Intravenous Every 24 hours 04/15/21 1437 04/15/21 1737   04/15/21 1445  remdesivir 200 mg in  sodium chloride 0.9% 250 mL IVPB  Status:  Discontinued       See Hyperspace for full Linked Orders Report.   200 mg 580 mL/hr over 30 Minutes Intravenous Once 04/15/21 1437 04/15/21 1438        Time Spent in  minutes  30   Lala Lund M.D on 04/19/2021 at 10:29 AM  To page go to [email or web address]   [facility] -  Office  [phone]      See all Orders from today for further details    Objective:   Vitals:   04/18/21 2357 04/19/21 0355 04/19/21 0800 04/19/21 0930  BP: 125/60 122/69 110/85   Pulse: (!) 47 (!) 49 (!) 53   Resp: 16 16 16 17   Temp: 98.2 F (36.8 C) 98 F (36.7 C)    TempSrc: Oral Oral    SpO2: 92% 94% 91% 90%  Weight:      Height:        Wt Readings from Last 3 Encounters:  04/16/21 120.2 kg  01/27/21 120.5 kg  12/20/20 119.3 kg     Intake/Output Summary (Last 24 hours) at 04/19/2021 1029 Last data filed at 04/18/2021 1500 Gross per 24 hour  Intake --  Output 500 ml  Net -500 ml     Physical Exam  Awake Alert, No new F.N deficits, Normal affect [place].AT,PERRAL Supple Neck,No JVD, No cervical lymphadenopathy appriciated.  Symmetrical Chest wall movement, Good air movement bilaterally, +ve rales RRR,No Gallops, Rubs or new Murmurs, No Parasternal Heave +ve B.Sounds, Abd Soft, No tenderness, No organomegaly appriciated, No rebound - guarding or rigidity. No Cyanosis, Clubbing or edema, No new Rash or bruise      Data Review:    CBC Recent Labs  Lab 04/15/21 0234 04/16/21 0047 04/17/21 0116 04/18/21 0059 04/19/21 0135  WBC 8.6 4.8 4.5 5.0 5.1  HGB 16.2* 15.7* 14.7 14.8 15.4*  HCT 48.9* 46.5* 45.2 46.4* 46.6*  PLT 128* 117* 142* 170 189  MCV 94.6 92.8 94.4 94.9 93.2  MCH 31.3 31.3 30.7 30.3 30.8  MCHC 33.1 33.8 32.5 31.9 33.0  RDW 15.2 15.2 15.0 14.8 14.8  LYMPHSABS 2.8 1.3 0.9 0.9 0.9  MONOABS 0.8 0.2 0.4 0.7 0.5  EOSABS 0.0 0.0 0.0 0.1 0.1  BASOSABS 0.0 0.0 0.0 0.0 0.0    Recent Labs  Lab 04/15/21 0234 04/15/21 1044 04/15/21 1430 04/15/21 1440 04/16/21 0047 04/17/21 0116 04/18/21 0059 04/19/21 0135  NA 133*  --   --   --  131* 133* 134* 134*  K 3.9  --   --   --  4.2 4.8 5.0 4.4  CL 96*  --   --   --  96* 97* 100 98   CO2 24  --   --   --  23 24 27 26   GLUCOSE 172*  --   --   --  325* 212* 167* 123*  BUN 17  --   --   --  26* 34* 30* 30*  CREATININE 1.34*  --   --   --  1.33* 1.36* 1.16* 0.96  CALCIUM 8.2*  --   --   --  8.2* 8.7* 8.4* 8.5*  AST  --   --  42*  --  41 34 29 30  ALT  --   --  23  --  26 25 22 24   ALKPHOS  --   --  44  --  52 42 42 46  BILITOT  --   --  0.8  --  0.6 0.4 0.6 0.5  ALBUMIN  --   --  3.0*  --  3.0* 2.7* 2.6* 2.7*  MG  --   --  2.3  --  2.3 2.2 2.3 2.3  CRP  --   --  3.7*  --  4.2* 2.7* 1.1* 0.7  DDIMER  --   --  0.49  --  0.51* 0.48 0.35 0.37  PROCALCITON  --   --   --   --  <0.10 <0.10 <0.10  --   TSH  --   --   --   --   --   --  1.188  --   HGBA1C  --   --   --  9.0*  --   --   --   --   BNP  --  47.4  --   --  72.0 94.3 96.7 84.5    ------------------------------------------------------------------------------------------------------------------ No results for input(s): CHOL, HDL, LDLCALC, TRIG, CHOLHDL, LDLDIRECT in the last 72 hours.  Lab Results  Component Value Date   HGBA1C 9.0 (H) 04/15/2021   ------------------------------------------------------------------------------------------------------------------ Recent Labs    04/18/21 0059  TSH 1.188    Cardiac Enzymes No results for input(s): CKMB, TROPONINI, MYOGLOBIN in the last 168 hours.  Invalid input(s): CK ------------------------------------------------------------------------------------------------------------------    Component Value Date/Time   BNP 84.5 04/19/2021 0135     Radiology Reports DG Chest 2 View  Result Date: 04/15/2021 CLINICAL DATA:  Shortness of breath x2 days. EXAM: CHEST - 2 VIEW COMPARISON:  September 16, 2020 FINDINGS: A single lead ventricular pacer is noted. Mild, diffuse, chronic appearing increased interstitial lung markings are noted. There is no evidence of acute infiltrate, pleural effusion or pneumothorax. The cardiac silhouette is moderately enlarged and unchanged  in size. The visualized skeletal structures are unremarkable. IMPRESSION: Stable cardiomegaly with chronic appearing increased interstitial lung markings. Electronically Signed   By: Virgina Norfolk M.D.   On: 04/15/2021 03:12   DG Chest Port 1 View  Result Date: 04/17/2021 CLINICAL DATA:  Shortness of breath EXAM: PORTABLE CHEST 1 VIEW COMPARISON:  April 16, 2021 FINDINGS: Stable cardiomegaly. Stable AICD device. The hila and mediastinum are unremarkable. No pneumothorax. Diffuse mild increased interstitial opacities. More focal mild opacities in the bases. No other acute abnormalities. IMPRESSION: 1. Findings are most suggestive of cardiomegaly with mild edema. The more focal opacity in the right base may represent atelectasis or developing infiltrate. Recommend attention on follow-up. Electronically Signed   By: Dorise Bullion III M.D.   On: 04/17/2021 07:44   DG Chest Port 1 View  Result Date: 04/16/2021 CLINICAL DATA:  Shortness of breath EXAM: PORTABLE CHEST 1 VIEW COMPARISON:  04/16/2019 FINDINGS: Cardiomegaly. Single chamber pacer lead into the right ventricle. Artifact from EKG leads. Mild interstitial coarsening. Airway thickening and paraseptal emphysema by 2018 chest CT. IMPRESSION: Baseline appearance of the chest.  No acute finding. Electronically Signed   By: Jorje Guild M.D.   On: 04/16/2021 07:37

## 2021-04-19 NOTE — TOC Initial Note (Signed)
Transition of Care [facility]) - Initial/Assessment Note    Patient Details  Name: Heidi Stark MRN: [id number] Date of Birth: 1940/08/02  Transition [facility]) CM/SW Contact:    Carles Collet, RN Phone Number: 04/19/2021, 10:57 AM  Clinical Narrative:            Damaris Schooner w patient re DC needs and resources. She states that she has home oxygen through [employer], she confirms a concentrator and tanks for transport at home. She requested longer tubing for her home oxygen. She also requested a RW. Order placed for walker, it will be delivered to the room today for her to take home w her.  [employer] will either deliver longer tubing to the room or home after DC. She confirms that she has a ride home.        Expected Discharge Plan: Home/Self Care Barriers to Discharge: Continued Medical Work up   Patient Goals and CMS Choice Patient states their goals for this hospitalization and ongoing recovery are:: to go home      Expected Discharge Plan and Services Expected Discharge Plan: Home/Self Care   Discharge Planning Services: CM Consult Post Acute Care Choice: Durable Medical Equipment Living arrangements for the past 2 months: Single Family Home                 DME Arranged: Walker rolling DME Agency: [employer] Date DME Agency Contacted: 04/19/21 Time DME Agency Contacted: 0932 Representative spoke with at DME Agency: Freda Munro HH Arranged: NA          Prior Living Arrangements/Services Living arrangements for the past 2 months: Single Family Home                     Activities of Daily Living Home Assistive Devices/Equipment: Blood pressure cuff, Cane (specify quad or straight), CBG Meter, CPAP, Dentures (specify type), Eyeglasses, Oxygen, Scales, Shower chair without back, Walker (specify type) (Uppers & lowers. 4-wheeled walker.) ADL Screening (condition at time of admission) Patient's cognitive ability adequate to safely complete daily activities?: Yes Is the patient deaf  or have difficulty hearing?: No Does the patient have difficulty seeing, even when wearing glasses/contacts?: No Does the patient have difficulty concentrating, remembering, or making decisions?: No Patient able to express need for assistance with ADLs?: Yes Does the patient have difficulty dressing or bathing?: No Independently performs ADLs?: Yes (appropriate for developmental age) Does the patient have difficulty walking or climbing stairs?: Yes Weakness of Legs: None Weakness of Arms/Hands: None  Permission Sought/Granted                  Emotional Assessment              Admission diagnosis:  Hypoxia [R09.02] COPD exacerbation ([place]) [J44.1] COVID-19 [U07.1] Patient Active Problem List   Diagnosis Date Noted   COVID-19 04/15/2021   Respiratory failure with hypoxia ([place]) 35/57/3220   Systolic CHF ([place]) 25/42/7062   CKD (chronic kidney disease) stage 3, GFR 30-59 ml/min ([place]) 04/15/2021   Elevated troponin 04/15/2021   Acute on chronic systolic (congestive) heart failure ([place]) 09/14/2020   Wide-complex tachycardia 09/07/2020   Ventricular tachyarrhythmia 09/07/2020   VT (ventricular tachycardia) 09/07/2020   Persistent atrial fibrillation ([place]) 04/30/2018   Wound of left leg 10/01/2017   Cellulitis of right lower extremity 11/27/2016   Encounter for assessment for deep vein thrombosis (DVT) 11/14/2016   Hypokalemia 08/13/2016   Constipation 08/13/2016   Type 2 diabetes mellitus with hyperglycemia, with long-term current  use of insulin ([place])    Atherosclerosis of aorta ([place]) 06/26/2016   Tobacco abuse 06/26/2016   Colitis 11/24/2014   Severe obesity (BMI >= 40) ([place]) 11/12/2013   PAF (paroxysmal atrial fibrillation) ([place]) 11/03/2013   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 10/28/2013   COLONIC POLYPS 08/16/2010   PAD (peripheral artery disease) ([place]) 08/15/2010   HLD (hyperlipidemia) 11/18/2009   MYALGIA 11/18/2009   OBESITY  05/14/2007   PULMONARY NODULE, RIGHT MIDDLE LOBE 05/14/2007   Obstructive sleep apnea 12/06/2006   Essential hypertension 12/06/2006   COPD (chronic obstructive pulmonary disease) ([place]) 12/06/2006   PCP:  Ann Held, DO Pharmacy:   [id number] - [place], [place] [address] [place] [address] Phone: [phone] Fax: [phone]  [id number] - [place], [place] [address] Phone: [phone] Fax: [phone]     Social Determinants of Health (SDOH) Interventions    Readmission Risk Interventions No flowsheet data found.

## 2021-04-20 ENCOUNTER — Inpatient Hospital Stay (HOSPITAL_COMMUNITY): Payer: Medicare Other

## 2021-04-20 DIAGNOSIS — U071 COVID-19: Secondary | ICD-10-CM | POA: Diagnosis not present

## 2021-04-20 LAB — MAGNESIUM: Magnesium: 2.5 mg/dL — ABNORMAL HIGH (ref 1.7–2.4)

## 2021-04-20 LAB — COMPREHENSIVE METABOLIC PANEL
ALT: 25 U/L (ref 0–44)
AST: 33 U/L (ref 15–41)
Albumin: 2.8 g/dL — ABNORMAL LOW (ref 3.5–5.0)
Alkaline Phosphatase: 46 U/L (ref 38–126)
Anion gap: 9 (ref 5–15)
BUN: 30 mg/dL — ABNORMAL HIGH (ref 8–23)
CO2: 28 mmol/L (ref 22–32)
Calcium: 8.1 mg/dL — ABNORMAL LOW (ref 8.9–10.3)
Chloride: 97 mmol/L — ABNORMAL LOW (ref 98–111)
Creatinine, Ser: 0.99 mg/dL (ref 0.44–1.00)
GFR, Estimated: 58 mL/min — ABNORMAL LOW (ref >60)
Glucose, Bld: 68 mg/dL — ABNORMAL LOW (ref 70–99)
Potassium: 4.2 mmol/L (ref 3.5–5.1)
Sodium: 134 mmol/L — ABNORMAL LOW (ref 135–145)
Total Bilirubin: 0.4 mg/dL (ref 0.3–1.2)
Total Protein: 6.4 g/dL — ABNORMAL LOW (ref 6.5–8.1)

## 2021-04-20 LAB — GLUCOSE, CAPILLARY
Glucose-Capillary: 131 mg/dL — ABNORMAL HIGH (ref 70–99)
Glucose-Capillary: 79 mg/dL (ref 70–99)
Glucose-Capillary: 80 mg/dL (ref 70–99)
Glucose-Capillary: 153 mg/dL — ABNORMAL HIGH (ref 70–99)
Glucose-Capillary: 92 mg/dL (ref 70–99)

## 2021-04-20 LAB — CBC WITH DIFFERENTIAL/PLATELET
Abs Immature Granulocytes: 0.09 10*3/uL — ABNORMAL HIGH (ref 0.00–0.07)
Basophils Absolute: 0 10*3/uL (ref 0.0–0.1)
Basophils Relative: 0 %
Eosinophils Absolute: 0 10*3/uL (ref 0.0–0.5)
Eosinophils Relative: 0 %
HCT: 46.9 % — ABNORMAL HIGH (ref 36.0–46.0)
Hemoglobin: 15.5 g/dL — ABNORMAL HIGH (ref 12.0–15.0)
Immature Granulocytes: 1 %
Lymphocytes Relative: 15 %
Lymphs Abs: 2.1 10*3/uL (ref 0.7–4.0)
MCH: 30.8 pg (ref 26.0–34.0)
MCHC: 33 g/dL (ref 30.0–36.0)
MCV: 93.2 fL (ref 80.0–100.0)
Monocytes Absolute: 1.6 10*3/uL — ABNORMAL HIGH (ref 0.1–1.0)
Monocytes Relative: 11 %
Neutro Abs: 10.1 10*3/uL — ABNORMAL HIGH (ref 1.7–7.7)
Neutrophils Relative %: 73 %
Platelets: 183 10*3/uL (ref 150–400)
RBC: 5.03 MIL/uL (ref 3.87–5.11)
RDW: 15 % (ref 11.5–15.5)
WBC: 13.9 10*3/uL — ABNORMAL HIGH (ref 4.0–10.5)
nRBC: 0 % (ref 0.0–0.2)

## 2021-04-20 LAB — C-REACTIVE PROTEIN: CRP: 3.4 mg/dL — ABNORMAL HIGH (ref <1.0)

## 2021-04-20 LAB — PROCALCITONIN: Procalcitonin: <0.1 ng/mL

## 2021-04-20 LAB — BRAIN NATRIURETIC PEPTIDE: B Natriuretic Peptide: 89.9 pg/mL (ref 0.0–100.0)

## 2021-04-20 NOTE — Progress Notes (Signed)
PROGRESS NOTE                                                                                                                                                                                                             Patient Demographics:    Heidi Stark, is a 80 y.o. female, DOB - Sep 27, 1940, DUK:[id number]  Outpatient Primary MD for the patient is [name], [name], DO    LOS - 5  Admit date - 04/15/2021    Chief Complaint  Patient presents with   Shortness of Breath       Brief Narrative (HPI from H&P)    Heidi Stark is a 80 y.o. 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  Of note she is not vaccinated.   Subjective:   Feeling tired   Assessment  & Plan :      Acute Hypoxic Resp. Failure due to Acute Covid 19 Viral Pneumonitis during the ongoing 2020 Covid 19 Pandemic  - moderate to severe respiratory involvement, currently on nasal cannula oxygen, has been placed on steroids and Remdesivir much better, titrate down o2, uses PRN o2 at home, taper down steroids, finish Remdesivir course, Lasix for #3 - CHF, new yellow phlegm - sputum gram stain and culture, PO Azithro. Encouraged the patient to sit up in chair in the daytime use I-S and flutter valve for pulmonary toiletry.  Will advance activity and titrate down oxygen as possible.   History of paroxysmal atrial fibrillation [place] vas 2 score of greater than 5 with history of renal artery infarct which could be embolic with  episodes of bradycardia on telemetry.  Continue Entresto at half home dose, home dose anticoagulation continued, beta-blocker held due to episodes of bradycardia, case discussed with Dr. Peter Martinique, EP on-call, home dose amiodarone continued as she has history of V. tach, monitor on telemetry, she is stable and asymptomatic, stable TSH.  Acute on chronic diastolic heart failure  EF 50%, VT.  She has a single chamber MDT ICD.  Was dehydrated and hypotensive, now slight overload  DC beta-blocker as she is bradycardic, Entresto half home dose and monitor.   Underlying history of COPD as needed 2 L of oxygen at home  OSA.  CPAP at night.  History of morbid obesity Estimated body mass index is 42.77 kg/m as calculated from the following:   Height as of this encounter: 5\' 6"  (1.676 m).   Weight as of this encounter: 120.2 kg.    Dehydration with hyponatremia.  resolved    Mild elevation in troponin due to demand ischemia from hypoxia.  No chest pain or acute ST changes.  No further work-up.   V. tach in the past.  Has AICD.  Continue amiodarone for now.   Dyslipidemia.  On statin continue.  DM type II.  On 70/30, Premeal NovoLog and sliding scale, dose adjsuted. Steroids being tapered.            Code Status :  Full  Consults  :  None  PUD Prophylaxis :        Disposition Plan  :    Status is: Inpatient  Remains inpatient appropriate because:IV treatments appropriate due to intensity of illness or inability to take PO  Dispo: The patient is from: Home              Anticipated d/c is to: Home              Patient currently is not medically stable to d/c. Home in AM?   Difficult to place patient Yes  Inpatient Medications  Scheduled Meds:  amiodarone  200 mg Oral Daily   apixaban  5 mg Oral BID   vitamin C  500 mg Oral Daily   azithromycin  500 mg Oral Daily   dapagliflozin propanediol  5 mg Oral Daily   fenofibrate  160 mg Oral Daily   furosemide  80 mg Oral  Daily   icosapent Ethyl  2 g Oral BID   insulin aspart  0-15 Units Subcutaneous TID WC   insulin aspart  0-5 Units Subcutaneous QHS   insulin aspart  4 Units Subcutaneous TID WC   insulin aspart protamine- aspart  35 Units Subcutaneous BID WC   Ipratropium-Albuterol  1 puff Inhalation TID   mouth rinse  15 mL Mouth Rinse BID   melatonin  5 mg Oral QHS   multivitamin with minerals  1 tablet Oral Daily   rosuvastatin  20 mg Oral Daily   sacubitril-valsartan  1 tablet Oral BID   sodium chloride flush  3 mL Intravenous Q12H   zinc sulfate  220 mg Oral Daily   Continuous Infusions:   PRN Meds:.acetaminophen, albuterol, guaiFENesin-dextromethorphan  Antibiotics  :    Anti-infectives (From admission, onward)    Start     Dose/Rate Route Frequency Ordered Stop   04/19/21 1130  azithromycin (ZITHROMAX) tablet 500 mg        500 mg Oral Daily 04/19/21 1037     04/16/21 1445  azithromycin (ZITHROMAX) tablet 500 mg  Status:  Discontinued       See Hyperspace for full Linked Orders Report.   500 mg Oral Daily 04/15/21 1437 04/16/21 1035   04/16/21 1000  remdesivir 100 mg in sodium chloride 0.9 % 100 mL IVPB       See Hyperspace for full Linked Orders Report.   100 mg 200 mL/hr over 30 Minutes Intravenous Daily 04/15/21 1324  04/19/21 0958   04/16/21 1000  remdesivir 100 mg in sodium chloride 0.9 % 100 mL IVPB  Status:  Discontinued       See Hyperspace for full Linked Orders Report.   100 mg 200 mL/hr over 30 Minutes Intravenous Daily 04/15/21 1437 04/15/21 1438   04/15/21 1500  remdesivir 200 mg in sodium chloride 0.9% 250 mL IVPB       See Hyperspace for full Linked Orders Report.   200 mg 580 mL/hr over 30 Minutes Intravenous Once 04/15/21 1324 04/15/21 1722   04/15/21 1445  azithromycin (ZITHROMAX) 500 mg in sodium chloride 0.9 % 250 mL IVPB       See Hyperspace for full Linked Orders Report.   500 mg 250 mL/hr over 60 Minutes Intravenous Every 24 hours 04/15/21 1437 04/15/21 1737    04/15/21 1445  remdesivir 200 mg in sodium chloride 0.9% 250 mL IVPB  Status:  Discontinued       See Hyperspace for full Linked Orders Report.   200 mg 580 mL/hr over 30 Minutes Intravenous Once 04/15/21 1437 04/15/21 1438        Time Spent in minutes  [place] DO on 04/20/2021 at 1:52 PM      Objective:   Vitals:   04/20/21 0357 04/20/21 0731 04/20/21 0949 04/20/21 1054  BP: (!) 126/59 (!) 107/54  119/73  Pulse: (!) 52 (!) 59  (!) 58  Resp: 17 18  (!) 22  Temp: 98.3 F (36.8 C) 98 F (36.7 C)  98.5 F (36.9 C)  TempSrc: Axillary Oral  Oral  SpO2: 92% 93% 92% 91%  Weight:      Height:        Wt Readings from Last 3 Encounters:  04/16/21 120.2 kg  01/27/21 120.5 kg  12/20/20 119.3 kg     Intake/Output Summary (Last 24 hours) at 04/20/2021 1352 Last data filed at 04/20/2021 1254 Gross per 24 hour  Intake 540 ml  Output 600 ml  Net -60 ml     Physical Exam   General: Appearance:    Severely obese female in no acute distress     Lungs:     respirations unlabored  Heart:    Bradycardic.   MS:   All extremities are intact.    Neurologic:   Awake, alert, oriented x 3. No apparent focal neurological           defect.          Data Review:    CBC Recent Labs  Lab 04/16/21 0047 04/17/21 0116 04/18/21 0059 04/19/21 0135 04/20/21 0141  WBC 4.8 4.5 5.0 5.1 13.9*  HGB 15.7* 14.7 14.8 15.4* 15.5*  HCT 46.5* 45.2 46.4* 46.6* 46.9*  PLT 117* 142* 170 189 183  MCV 92.8 94.4 94.9 93.2 93.2  MCH 31.3 30.7 30.3 30.8 30.8  MCHC 33.8 32.5 31.9 33.0 33.0  RDW 15.2 15.0 14.8 14.8 15.0  LYMPHSABS 1.3 0.9 0.9 0.9 2.1  MONOABS 0.2 0.4 0.7 0.5 1.6*  EOSABS 0.0 0.0 0.1 0.1 0.0  BASOSABS 0.0 0.0 0.0 0.0 0.0    Recent Labs  Lab 04/15/21 1430 04/15/21 1440 04/16/21 0047 04/17/21 0116 04/18/21 0059 04/19/21 0135 04/20/21 0141  NA  --   --  131* 133* 134* 134* 134*  K  --   --  4.2 4.8 5.0 4.4 4.2  CL  --   --  96* 97* 100 98 97*  CO2  --    --  23 24 27 26 28   GLUCOSE  --   --  325* 212* 167* 123* 68*  BUN  --   --  26* 34* 30* 30* 30*  CREATININE  --   --  1.33* 1.36* 1.16* 0.96 0.99  CALCIUM  --   --  8.2* 8.7* 8.4* 8.5* 8.1*  AST 42*  --  41 34 29 30 33  ALT 23  --  26 25 22 24 25   ALKPHOS 44  --  52 42 42 46 46  BILITOT 0.8  --  0.6 0.4 0.6 0.5 0.4  ALBUMIN 3.0*  --  3.0* 2.7* 2.6* 2.7* 2.8*  MG 2.3  --  2.3 2.2 2.3 2.3 2.5*  CRP 3.7*  --  4.2* 2.7* 1.1* 0.7 3.4*  DDIMER 0.49  --  0.51* 0.48 0.35 0.37  --   PROCALCITON  --   --  <0.10 <0.10 <0.10 <0.10 <0.10  TSH  --   --   --   --  1.188  --   --   HGBA1C  --  9.0*  --   --   --   --   --   BNP  --   --  72.0 94.3 96.7 84.5 89.9    ------------------------------------------------------------------------------------------------------------------ No results for input(s): CHOL, HDL, LDLCALC, TRIG, CHOLHDL, LDLDIRECT in the last 72 hours.  Lab Results  Component Value Date   HGBA1C 9.0 (H) 04/15/2021   ------------------------------------------------------------------------------------------------------------------ Recent Labs    04/18/21 0059  TSH 1.188    Cardiac Enzymes No results for input(s): CKMB, TROPONINI, MYOGLOBIN in the last 168 hours.  Invalid input(s): CK ------------------------------------------------------------------------------------------------------------------    Component Value Date/Time   BNP 89.9 04/20/2021 0141     Radiology Reports DG Chest 2 View  Result Date: 04/15/2021 CLINICAL DATA:  Shortness of breath x2 days. EXAM: CHEST - 2 VIEW COMPARISON:  September 16, 2020 FINDINGS: A single lead ventricular pacer is noted. Mild, diffuse, chronic appearing increased interstitial lung markings are noted. There is no evidence of acute infiltrate, pleural effusion or pneumothorax. The cardiac silhouette is moderately enlarged and unchanged in size. The visualized skeletal structures are unremarkable. IMPRESSION: Stable cardiomegaly with  chronic appearing increased interstitial lung markings. Electronically Signed   By: Virgina Norfolk M.D.   On: 04/15/2021 03:12   DG Chest Port 1 View  Result Date: 04/20/2021 CLINICAL DATA:  Shortness of breath EXAM: PORTABLE CHEST 1 VIEW COMPARISON:  Chest radiograph 04/17/2021 FINDINGS: A left chest wall cardiac device and single lead are stable. The heart is mildly enlarged, unchanged. The mediastinal contours are stable. There is mild vascular congestion without definite overt pulmonary edema. Aeration of the lung bases has significantly improved. There is no new focal airspace disease. There is no significant pleural effusion. There is no pneumothorax. The bones are stable. IMPRESSION: Improved pulmonary edema with improved aeration of the lung bases since 04/17/2021 Electronically Signed   By: Valetta Mole M.D.   On: 04/20/2021 08:45   DG Chest Port 1 View  Result Date: 04/17/2021 CLINICAL DATA:  Shortness of breath EXAM: PORTABLE CHEST 1 VIEW COMPARISON:  April 16, 2021 FINDINGS: Stable cardiomegaly. Stable AICD device. The hila and mediastinum are unremarkable. No pneumothorax. Diffuse mild increased interstitial opacities. More focal mild opacities in the bases. No other acute abnormalities. IMPRESSION: 1. Findings are most suggestive of cardiomegaly with mild edema. The more focal opacity in the right base may represent atelectasis or developing infiltrate. Recommend attention on follow-up. Electronically Signed  By: Dorise Bullion III M.D.   On: 04/17/2021 07:44   DG Chest Port 1 View  Result Date: 04/16/2021 CLINICAL DATA:  Shortness of breath EXAM: PORTABLE CHEST 1 VIEW COMPARISON:  04/16/2019 FINDINGS: Cardiomegaly. Single chamber pacer lead into the right ventricle. Artifact from EKG leads. Mild interstitial coarsening. Airway thickening and paraseptal emphysema by 2018 chest CT. IMPRESSION: Baseline appearance of the chest.  No acute finding. Electronically Signed   By: Jorje Guild M.D.   On: 04/16/2021 07:37

## 2021-04-20 NOTE — Progress Notes (Signed)
Spoke with patient on the phone about her diabetes. Was diagnosed "many years ago" States that she was on oral agents at first, has been on insulin for about 3 years. She takes 70/30 insulin 40 units in am, 30 units in pm. Sees Dr. Bubba Camp, endocrinologist, about every 6 months. Last was seen about 3-4 months ago. States that her A1C is not going down. Suggested that she talk with Dr. Bubba Camp about the A1C. Patient does check her blood sugars at home x2 per day.   Her cardiologist started her on Farxiga about 4 months ago. States that she lives with her husband and they eat out a lot.   Will continue to monitor blood sugars while in the hospital.  Harvel Ricks RN BSN CDE Diabetes Coordinator Pager: [phone]  8am-5pm

## 2021-04-20 NOTE — Progress Notes (Signed)
Occupational Therapy Treatment Patient Details Name: Heidi Stark MRN: [id number] DOB: 03-23-1941 Today's Date: 04/20/2021   History of present illness 80 y.o. female who presented to ED 04/15/21 with shortness of breath. +COVID   PMH significant of PAF, hypertension, hyperlipidemia, COPD, systolic heart failure, type 2 diabetes, OSA on CPAP, history of DVT, CKD stage III, hx of VT s/p ICD placement   OT comments  Patient continues to make nice progress toward patient focused goals despite not feeling well.  RW and long O2 cord delivered to her room, but patient does not believe she is quite ready for discharge.  O2 remains at 4L via [place], with O2 steady at 92% and HR ranging around 58.  Patient is moving well and needing essentially supervision at [facility] level with assist for O2 tank, and setup for stand grooming.  OT will continue to follow in the acute setting, but no follow up OT post acute is anticipated.     Recommendations for follow up therapy are one component of a multi-disciplinary discharge planning process, led by the attending physician.  Recommendations may be updated based on patient status, additional functional criteria and insurance authorization.    Follow Up Recommendations  No OT follow up    Equipment Recommendations  None recommended by OT    Recommendations for Other Services      Precautions / Restrictions Precautions Precautions: Fall Precaution Comments: Watch HR, BP and O2 sats Restrictions Weight Bearing Restrictions: No       Mobility Bed Mobility               General bed mobility comments: up in chair Patient Response: Cooperative  Transfers Overall transfer level: Needs assistance Equipment used: Rolling walker (2 wheeled) Transfers: Sit to/from [facility] Sit to Stand: Modified independent (Device/Increase time) Stand pivot transfers: Supervision            Balance Overall balance assessment: Needs  assistance Sitting-balance support: Feet supported Sitting balance-Leahy Scale: Good     Standing balance support: Bilateral upper extremity supported Standing balance-Leahy Scale: Fair Standing balance comment: able to stand sink side without RW                           ADL either performed or assessed with clinical judgement   ADL       Grooming: Wash/dry hands;Wash/dry face;Set up;Standing               Lower Body Dressing: Set up;Sit to/from stand   Toilet Transfer: Supervision/safety;RW   Toileting- Water quality scientist and Hygiene: Supervision/safety;Sitting/lateral lean       Functional mobility during ADLs: Supervision/safety;Rolling walker General ADL Comments: with O2 tank                                                                                                        Frequency  Min 2X/week        Progress Toward Goals  OT Goals(current goals can now be found in the care plan section)  Progress  towards OT goals: Progressing toward goals  Acute Rehab OT Goals Patient Stated Goal: Return home OT Goal Formulation: With patient Time For Goal Achievement: 04/30/21 Potential to Achieve Goals: Good  Plan      Co-evaluation                 AM-PAC OT "6 Clicks" Daily Activity     Outcome Measure   Help from another person eating meals?: None Help from another person taking care of personal grooming?: None Help from another person toileting, which includes using toliet, bedpan, or urinal?: A Little Help from another person bathing (including washing, rinsing, drying)?: A Little Help from another person to put on and taking off regular upper body clothing?: None Help from another person to put on and taking off regular lower body clothing?: A Little 6 Click Score: 21    End of Session Equipment Utilized During Treatment: Rolling walker;Oxygen  OT Visit Diagnosis:  Unsteadiness on feet (R26.81);Muscle weakness (generalized) (M62.81)   Activity Tolerance Patient tolerated treatment well   Patient Left in chair;with call bell/phone within reach   Nurse Communication          Time: 3559-7416 OT Time Calculation (min): 22 min  Charges: OT General Charges $OT Visit: 1 Visit OT Treatments $Self Care/Home Management : 8-22 mins  04/20/2021  RP, OTR/L  Acute Rehabilitation Services  Office:  [phone]   Metta Clines 04/20/2021, 9:51 AM

## 2021-04-20 NOTE — Progress Notes (Signed)
Physical Therapy Treatment Patient Details Name: Heidi Stark MRN: [id number] DOB: 1940-08-25 Today's Date: 04/20/2021   History of Present Illness 80 y.o. female who presented to ED 04/15/21 with shortness of breath. +COVID   PMH significant of PAF, hypertension, hyperlipidemia, COPD, systolic heart failure, type 2 diabetes, OSA on CPAP, history of DVT, CKD stage III, hx of VT s/p ICD placement    PT Comments    Session limited to providing pt exercise handout and reviewing exercises she can complete on her own, twice per day. Reviewed use of IS and flutter valve with pt demonstrating good technique. Patient was recently up walking with OT and did not want to walk again at this time.    Recommendations for follow up therapy are one component of a multi-disciplinary discharge planning process, led by the attending physician.  Recommendations may be updated based on patient status, additional functional criteria and insurance authorization.  Follow Up Recommendations  No PT follow up     Equipment Recommendations  Rolling walker with 5" wheels (per pt her other walker is 4 wheeled and not safe for her)    Recommendations for Other Services       Precautions / Restrictions Precautions Precautions: Fall Precaution Comments: Watch HR, BP and O2 sats Restrictions Weight Bearing Restrictions: No     Mobility  Bed Mobility               General bed mobility comments: pt up in chair    Transfers Overall transfer level: Needs assistance Equipment used: Rolling walker (2 wheeled) Transfers: Sit to/from [facility] Sit to Stand: Modified independent (Device/Increase time) Stand pivot transfers: Supervision       General transfer comment: pt deferred due to up recently with OT  Ambulation/Gait             General Gait Details: pt deferred due to up recently with OT   Stairs             Wheelchair Mobility    Modified Rankin (Stroke  Patients Only)       Balance Overall balance assessment: Needs assistance Sitting-balance support: Feet supported Sitting balance-Leahy Scale: Good     Standing balance support: Bilateral upper extremity supported Standing balance-Leahy Scale: Fair Standing balance comment: able to stand sink side without RW                            Cognition Arousal/Alertness: Awake/alert Behavior During Therapy: Flat affect Overall Cognitive Status: Within Functional Limits for tasks assessed                                        Exercises General Exercises - Lower Extremity Ankle Circles/Pumps: AROM;Both;5 reps Quad Sets: AROM;Both (several reps for education) Gluteal Sets: AROM;Both (several reps for education) Heel Slides: AROM;Both (several reps for education) Hip ABduction/ADduction: AROM;Both (several reps for education) Other Exercises Other Exercises: Reports she continues to use IS and flutter valve. Pulled 1290ml on IS and instructed in taking a slower, deeper breath (shown how to monitor with side "bead" between the arrows). Able to pull 1054ml with correct technique Other Exercises: Shoulder flexion bil    General Comments General comments (skin integrity, edema, etc.): Pt reports she walked in room with OT and did not want to walk again      Pertinent Vitals/Pain  Home Living                      Prior Function            PT Goals (current goals can now be found in the care plan section) Acute Rehab PT Goals Patient Stated Goal: Return home Time For Goal Achievement: 04/30/21 Potential to Achieve Goals: Good Progress towards PT goals: Not progressing toward goals - comment (refused ambulation; educated on exercises)    Frequency    Min 3X/week      PT Plan Current plan remains appropriate    Co-evaluation              AM-PAC PT "6 Clicks" Mobility   Outcome Measure  Help needed turning from your back to  your side while in a flat bed without using bedrails?: None Help needed moving from lying on your back to sitting on the side of a flat bed without using bedrails?: A Little Help needed moving to and from a bed to a chair (including a wheelchair)?: A Little Help needed standing up from a chair using your arms (e.g., wheelchair or bedside chair)?: A Little Help needed to walk in hospital room?: A Little Help needed climbing 3-5 steps with a railing? : A Little 6 Click Score: 19    End of Session Equipment Utilized During Treatment: Oxygen Activity Tolerance: Patient limited by fatigue Patient left: in chair;with call bell/phone within reach   PT Visit Diagnosis: Difficulty in walking, not elsewhere classified (R26.2)     Time: 0388-8280 PT Time Calculation (min) (ACUTE ONLY): 13 min  Charges:  $Therapeutic Exercise: 8-22 mins                      Arby Barrette, PT Pager [phone]    Rexanne Mano 04/20/2021, 10:54 AM

## 2021-04-21 ENCOUNTER — Encounter (HOSPITAL_COMMUNITY): Payer: Self-pay | Admitting: Family Medicine

## 2021-04-21 DIAGNOSIS — U071 COVID-19: Secondary | ICD-10-CM | POA: Diagnosis not present

## 2021-04-21 LAB — COMPREHENSIVE METABOLIC PANEL
ALT: 25 U/L (ref 0–44)
AST: 32 U/L (ref 15–41)
Albumin: 2.6 g/dL — ABNORMAL LOW (ref 3.5–5.0)
Alkaline Phosphatase: 47 U/L (ref 38–126)
Anion gap: 9 (ref 5–15)
BUN: 29 mg/dL — ABNORMAL HIGH (ref 8–23)
CO2: 32 mmol/L (ref 22–32)
Calcium: 8.2 mg/dL — ABNORMAL LOW (ref 8.9–10.3)
Chloride: 94 mmol/L — ABNORMAL LOW (ref 98–111)
Creatinine, Ser: 1.1 mg/dL — ABNORMAL HIGH (ref 0.44–1.00)
GFR, Estimated: 51 mL/min — ABNORMAL LOW (ref >60)
Glucose, Bld: 73 mg/dL (ref 70–99)
Potassium: 3.7 mmol/L (ref 3.5–5.1)
Sodium: 135 mmol/L (ref 135–145)
Total Bilirubin: 0.6 mg/dL (ref 0.3–1.2)
Total Protein: 6.1 g/dL — ABNORMAL LOW (ref 6.5–8.1)

## 2021-04-21 LAB — CBC WITH DIFFERENTIAL/PLATELET
Abs Immature Granulocytes: 0.11 10*3/uL — ABNORMAL HIGH (ref 0.00–0.07)
Basophils Absolute: 0 10*3/uL (ref 0.0–0.1)
Basophils Relative: 0 %
Eosinophils Absolute: 0.1 10*3/uL (ref 0.0–0.5)
Eosinophils Relative: 1 %
HCT: 45.1 % (ref 36.0–46.0)
Hemoglobin: 14.4 g/dL (ref 12.0–15.0)
Immature Granulocytes: 1 %
Lymphocytes Relative: 24 %
Lymphs Abs: 2.5 10*3/uL (ref 0.7–4.0)
MCH: 30.3 pg (ref 26.0–34.0)
MCHC: 31.9 g/dL (ref 30.0–36.0)
MCV: 94.7 fL (ref 80.0–100.0)
Monocytes Absolute: 1.6 10*3/uL — ABNORMAL HIGH (ref 0.1–1.0)
Monocytes Relative: 16 %
Neutro Abs: 5.9 10*3/uL (ref 1.7–7.7)
Neutrophils Relative %: 58 %
Platelets: 213 10*3/uL (ref 150–400)
RBC: 4.76 MIL/uL (ref 3.87–5.11)
RDW: 14.8 % (ref 11.5–15.5)
WBC: 10.1 10*3/uL (ref 4.0–10.5)
nRBC: 0 % (ref 0.0–0.2)

## 2021-04-21 LAB — EXPECTORATED SPUTUM ASSESSMENT W GRAM STAIN, RFLX TO RESP C

## 2021-04-21 LAB — GLUCOSE, CAPILLARY
Glucose-Capillary: 103 mg/dL — ABNORMAL HIGH (ref 70–99)
Glucose-Capillary: 52 mg/dL — ABNORMAL LOW (ref 70–99)
Glucose-Capillary: 209 mg/dL — ABNORMAL HIGH (ref 70–99)
Glucose-Capillary: 187 mg/dL — ABNORMAL HIGH (ref 70–99)
Glucose-Capillary: 173 mg/dL — ABNORMAL HIGH (ref 70–99)

## 2021-04-21 LAB — PROCALCITONIN: Procalcitonin: 1.8 ng/mL

## 2021-04-21 LAB — MAGNESIUM: Magnesium: 2.5 mg/dL — ABNORMAL HIGH (ref 1.7–2.4)

## 2021-04-21 LAB — C-REACTIVE PROTEIN: CRP: 5.3 mg/dL — ABNORMAL HIGH (ref <1.0)

## 2021-04-21 LAB — BRAIN NATRIURETIC PEPTIDE: B Natriuretic Peptide: 60.7 pg/mL (ref 0.0–100.0)

## 2021-04-21 MED ORDER — FUROSEMIDE 40 MG PO TABS
40.0000 mg | ORAL_TABLET | Freq: Every day | ORAL | Status: DC
  Administered 2021-04-22 – 2021-04-23 (×2): 40 mg via ORAL
  Filled 2021-04-21 (×2): qty 1

## 2021-04-21 MED ORDER — SODIUM CHLORIDE 0.9 % IV SOLN
1.0000 g | INTRAVENOUS | Status: DC
  Administered 2021-04-21 – 2021-04-22 (×2): 1 g via INTRAVENOUS
  Filled 2021-04-21 (×2): qty 10

## 2021-04-21 NOTE — Progress Notes (Signed)
PROGRESS NOTE                                                                                                                                                                                                             Patient Demographics:    Heidi Stark, is a 80 y.o. female, DOB - 1940-12-03, OZH:[id number]  Outpatient Primary MD for the patient is Ann Held, DO    LOS - 6  Admit date - 04/15/2021    Chief Complaint  Patient presents with   Shortness of Breath       Brief Narrative (HPI from H&P)    Heidi Stark is a 80 y.o. 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  Of note she is not vaccinated.   Subjective:  Still coughing up mucous   Assessment  & Plan :      Acute Hypoxic Resp. Failure due to Acute Covid 19 Viral Pneumonitis during the ongoing 2020 Covid 19 Pandemic  - moderate to severe respiratory involvement, currently on nasal cannula oxygen, has been placed on steroids and Remdesivir much better, titrate down o2, uses PRN o2 at home, -pro calcitonin trending up: add rocephin to azithro Encouraged the patient to sit up in chair in the daytime use I-S and flutter valve for pulmonary toiletry.  Will advance activity and titrate down oxygen as possible.   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  Continue Entresto at half home  dose, home dose anticoagulation continued, beta-blocker held due to episodes of bradycardia, case discussed with Dr. Peter Martinique, EP on-call, home dose amiodarone continued as she has history of V. tach, monitor on telemetry, she is stable and asymptomatic, stable TSH.  Acute on chronic diastolic heart failure EF 50%, VT.  She has a single chamber MDT ICD.  Was  dehydrated and hypotensive, now slight overload  DC beta-blocker as she is bradycardic, Entresto half home dose and monitor.   Underlying history of COPD as needed 2 L of oxygen at home  OSA.  CPAP at night.  History of morbid obesity Estimated body mass index is 42.77 kg/m as calculated from the following:   Height as of this encounter: 5\' 6"  (1.676 m).   Weight as of this encounter: 120.2 kg.    Dehydration with hyponatremia.  resolved    Mild elevation in troponin due to demand ischemia from hypoxia.  No chest pain or acute ST changes.  No further work-up.   V. tach in the past.  Has AICD.  Continue amiodarone for now.   Dyslipidemia.  On statin continue.  DM type II.  On 70/30, Premeal NovoLog and sliding scale, dose adjsuted. Steroids being tapered.            Code Status :  Full  Consults  :  None        Disposition Plan  :    Status is: Inpatient  Remains inpatient appropriate because:IV treatments appropriate due to intensity of illness or inability to take PO  Dispo: The patient is from: Home              Anticipated d/c is to: Home              Patient currently is not medically stable to d/c. Home in AM?   Difficult to place patient Yes  Inpatient Medications  Scheduled Meds:  amiodarone  200 mg Oral Daily   apixaban  5 mg Oral BID   vitamin C  500 mg Oral Daily   azithromycin  500 mg Oral Daily   dapagliflozin propanediol  5 mg Oral Daily   fenofibrate  160 mg Oral Daily   [START ON 04/22/2021] furosemide  40 mg Oral Daily   icosapent Ethyl  2 g Oral BID   insulin aspart  0-15 Units  Subcutaneous TID WC   insulin aspart  0-5 Units Subcutaneous QHS   insulin aspart  4 Units Subcutaneous TID WC   insulin aspart protamine- aspart  35 Units Subcutaneous BID WC   Ipratropium-Albuterol  1 puff Inhalation TID   mouth rinse  15 mL Mouth Rinse BID   melatonin  5 mg Oral QHS   multivitamin with minerals  1 tablet Oral Daily   rosuvastatin  20 mg Oral Daily   sacubitril-valsartan  1 tablet Oral BID   sodium chloride flush  3 mL Intravenous Q12H   zinc sulfate  220 mg Oral Daily   Continuous Infusions:  cefTRIAXone (ROCEPHIN)  IV 1 g (04/21/21 1400)    PRN Meds:.acetaminophen, albuterol, guaiFENesin-dextromethorphan  Antibiotics  :    Anti-infectives (From admission, onward)    Start     Dose/Rate Route Frequency Ordered Stop   04/21/21 1100  cefTRIAXone (ROCEPHIN) 1 g in sodium chloride 0.9 % 100 mL IVPB        1 g 200 mL/hr over 30 Minutes Intravenous Every 24 hours 04/21/21 1001     04/19/21 1130  azithromycin (ZITHROMAX) tablet 500 mg        500 mg Oral Daily 04/19/21 1037     04/16/21 1445  azithromycin (ZITHROMAX) tablet 500 mg  Status:  Discontinued       See Hyperspace for full Linked Orders Report.   500 mg Oral Daily 04/15/21 1437 04/16/21 1035   04/16/21 1000  remdesivir 100  mg in sodium chloride 0.9 % 100 mL IVPB       See Hyperspace for full Linked Orders Report.   100 mg 200 mL/hr over 30 Minutes Intravenous Daily 04/15/21 1324 04/19/21 0958   04/16/21 1000  remdesivir 100 mg in sodium chloride 0.9 % 100 mL IVPB  Status:  Discontinued       See Hyperspace for full Linked Orders Report.   100 mg 200 mL/hr over 30 Minutes Intravenous Daily 04/15/21 1437 04/15/21 1438   04/15/21 1500  remdesivir 200 mg in sodium chloride 0.9% 250 mL IVPB       See Hyperspace for full Linked Orders Report.   200 mg 580 mL/hr over 30 Minutes Intravenous Once 04/15/21 1324 04/15/21 1722   04/15/21 1445  azithromycin (ZITHROMAX) 500 mg in sodium chloride 0.9 % 250 mL IVPB        See Hyperspace for full Linked Orders Report.   500 mg 250 mL/hr over 60 Minutes Intravenous Every 24 hours 04/15/21 1437 04/15/21 1737   04/15/21 1445  remdesivir 200 mg in sodium chloride 0.9% 250 mL IVPB  Status:  Discontinued       See Hyperspace for full Linked Orders Report.   200 mg 580 mL/hr over 30 Minutes Intravenous Once 04/15/21 1437 04/15/21 1438        Time Spent in minutes  [place] DO on 04/21/2021 at 2:52 PM      Objective:   Vitals:   04/21/21 0020 04/21/21 0531 04/21/21 0759 04/21/21 1306  BP: (!) 108/55 93/68 (!) 105/56 (!) 111/50  Pulse: (!) 51 (!) 56 (!) 53 63  Resp: 16 18 18 15   Temp: 98.6 F (37 C) 97.9 F (36.6 C) 97.9 F (36.6 C) (!) 95.3 F (35.2 C)  TempSrc: Axillary Oral Oral Oral  SpO2: 97% 94% 93% 92%  Weight:      Height:        Wt Readings from Last 3 Encounters:  04/16/21 120.2 kg  01/27/21 120.5 kg  12/20/20 119.3 kg     Intake/Output Summary (Last 24 hours) at 04/21/2021 1452 Last data filed at 04/21/2021 1400 Gross per 24 hour  Intake 240 ml  Output 1500 ml  Net -1260 ml     Physical Exam    General: Appearance:    Severely obese female in no acute distress     Lungs:     On [place], respirations unlabored, diminished  Heart:    Normal heart rate.   MS:   All extremities are intact.    Neurologic:   Awake, alert, oriented x 3. No apparent focal neurological           defect.      Data Review:    CBC Recent Labs  Lab 04/17/21 0116 04/18/21 0059 04/19/21 0135 04/20/21 0141 04/21/21 0141  WBC 4.5 5.0 5.1 13.9* 10.1  HGB 14.7 14.8 15.4* 15.5* 14.4  HCT 45.2 46.4* 46.6* 46.9* 45.1  PLT 142* 170 189 183 213  MCV 94.4 94.9 93.2 93.2 94.7  MCH 30.7 30.3 30.8 30.8 30.3  MCHC 32.5 31.9 33.0 33.0 31.9  RDW 15.0 14.8 14.8 15.0 14.8  LYMPHSABS 0.9 0.9 0.9 2.1 2.5  MONOABS 0.4 0.7 0.5 1.6* 1.6*  EOSABS 0.0 0.1 0.1 0.0 0.1  BASOSABS 0.0 0.0 0.0 0.0 0.0    Recent Labs  Lab 04/15/21 1044  04/15/21 1430 04/15/21 1440 04/16/21 0047 04/17/21 0116 04/18/21 0059 04/19/21 0135 04/20/21 0141 04/21/21  0141  NA  --   --   --  131* 133* 134* 134* 134* 135  K  --   --   --  4.2 4.8 5.0 4.4 4.2 3.7  CL  --   --   --  96* 97* 100 98 97* 94*  CO2  --   --   --  23 24 27 26 28  32  GLUCOSE  --   --   --  325* 212* 167* 123* 68* 73  BUN  --   --   --  26* 34* 30* 30* 30* 29*  CREATININE  --   --   --  1.33* 1.36* 1.16* 0.96 0.99 1.10*  CALCIUM  --   --   --  8.2* 8.7* 8.4* 8.5* 8.1* 8.2*  AST  --  42*  --  41 34 29 30 33 32  ALT  --  23  --  26 25 22 24 25 25   ALKPHOS  --  44  --  52 42 42 46 46 47  BILITOT  --  0.8  --  0.6 0.4 0.6 0.5 0.4 0.6  ALBUMIN  --  3.0*  --  3.0* 2.7* 2.6* 2.7* 2.8* 2.6*  MG  --  2.3  --  2.3 2.2 2.3 2.3 2.5* 2.5*  CRP  --  3.7*  --  4.2* 2.7* 1.1* 0.7 3.4* 5.3*  DDIMER  --  0.49  --  0.51* 0.48 0.35 0.37  --   --   PROCALCITON   < >  --   --  <0.10 <0.10 <0.10 <0.10 <0.10 1.80  TSH  --   --   --   --   --  1.188  --   --   --   HGBA1C  --   --  9.0*  --   --   --   --   --   --   BNP  --   --   --  72.0 94.3 96.7 84.5 89.9 60.7   < > = values in this interval not displayed.    ------------------------------------------------------------------------------------------------------------------ No results for input(s): CHOL, HDL, LDLCALC, TRIG, CHOLHDL, LDLDIRECT in the last 72 hours.  Lab Results  Component Value Date   HGBA1C 9.0 (H) 04/15/2021   ------------------------------------------------------------------------------------------------------------------ No results for input(s): TSH, T4TOTAL, T3FREE, THYROIDAB in the last 72 hours.  Invalid input(s): FREET3   Cardiac Enzymes No results for input(s): CKMB, TROPONINI, MYOGLOBIN in the last 168 hours.  Invalid input(s): CK ------------------------------------------------------------------------------------------------------------------    Component Value Date/Time   BNP 60.7 04/21/2021  0141     Radiology Reports DG Chest 2 View  Result Date: 04/15/2021 CLINICAL DATA:  Shortness of breath x2 days. EXAM: CHEST - 2 VIEW COMPARISON:  September 16, 2020 FINDINGS: A single lead ventricular pacer is noted. Mild, diffuse, chronic appearing increased interstitial lung markings are noted. There is no evidence of acute infiltrate, pleural effusion or pneumothorax. The cardiac silhouette is moderately enlarged and unchanged in size. The visualized skeletal structures are unremarkable. IMPRESSION: Stable cardiomegaly with chronic appearing increased interstitial lung markings. Electronically Signed   By: Virgina Norfolk M.D.   On: 04/15/2021 03:12   DG Chest Port 1 View  Result Date: 04/20/2021 CLINICAL DATA:  Shortness of breath EXAM: PORTABLE CHEST 1 VIEW COMPARISON:  Chest radiograph 04/17/2021 FINDINGS: A left chest wall cardiac device and single lead are stable. The heart is mildly enlarged, unchanged. The mediastinal contours are stable. There is  mild vascular congestion without definite overt pulmonary edema. Aeration of the lung bases has significantly improved. There is no new focal airspace disease. There is no significant pleural effusion. There is no pneumothorax. The bones are stable. IMPRESSION: Improved pulmonary edema with improved aeration of the lung bases since 04/17/2021 Electronically Signed   By: Valetta Mole M.D.   On: 04/20/2021 08:45   DG Chest Port 1 View  Result Date: 04/17/2021 CLINICAL DATA:  Shortness of breath EXAM: PORTABLE CHEST 1 VIEW COMPARISON:  April 16, 2021 FINDINGS: Stable cardiomegaly. Stable AICD device. The hila and mediastinum are unremarkable. No pneumothorax. Diffuse mild increased interstitial opacities. More focal mild opacities in the bases. No other acute abnormalities. IMPRESSION: 1. Findings are most suggestive of cardiomegaly with mild edema. The more focal opacity in the right base may represent atelectasis or developing infiltrate. Recommend  attention on follow-up. Electronically Signed   By: Dorise Bullion III M.D.   On: 04/17/2021 07:44   DG Chest Port 1 View  Result Date: 04/16/2021 CLINICAL DATA:  Shortness of breath EXAM: PORTABLE CHEST 1 VIEW COMPARISON:  04/16/2019 FINDINGS: Cardiomegaly. Single chamber pacer lead into the right ventricle. Artifact from EKG leads. Mild interstitial coarsening. Airway thickening and paraseptal emphysema by 2018 chest CT. IMPRESSION: Baseline appearance of the chest.  No acute finding. Electronically Signed   By: Jorje Guild M.D.   On: 04/16/2021 07:37

## 2021-04-21 NOTE — Progress Notes (Signed)
Inpatient Diabetes Program Recommendations  AACE/ADA: New Consensus Statement on Inpatient Glycemic Control (2015)  Target Ranges:  Prepandial:   less than 140 mg/dL      Peak postprandial:   less than 180 mg/dL (1-2 hours)      Critically ill patients:  140 - 180 mg/dL   Lab Results  Component Value Date   GLUCAP 209 (H) 04/21/2021   HGBA1C 9.0 (H) 04/15/2021    Review of Glycemic Control Results for Heidi Stark, Heidi Stark (MRN [id number]) as of 04/21/2021 15:17  Ref. Range 04/20/2021 07:26 04/20/2021 10:51 04/20/2021 12:06 04/20/2021 15:29 04/20/2021 22:50 04/21/2021 08:01 04/21/2021 13:04 04/21/2021 14:08  Glucose-Capillary Latest Ref Range: 70 - 99 mg/dL 131 (H) 79 80 153 (H) 92 103 (H) 52 (L) 209 (H)    Inpatient Diabetes Program Recommendations:   Patient had hypoglycemia to 52 post Novolog correction and meal coverage. Please Consider: -D/C Novolog meal coverage since patient is on 70/30 to assist with meals. Secure chat sent to Dr. Eliseo Squires.  Thank you, Heidi Stark. Heidi Fake, RN, MSN, CDE  Diabetes Coordinator Inpatient Glycemic Control Team Team Pager [phone] (8am-5pm) 04/21/2021 3:19 PM

## 2021-04-21 NOTE — Progress Notes (Signed)
RN placed patient on home CPAP.

## 2021-04-22 DIAGNOSIS — U071 COVID-19: Secondary | ICD-10-CM | POA: Diagnosis not present

## 2021-04-22 LAB — COMPREHENSIVE METABOLIC PANEL
ALT: 26 U/L (ref 0–44)
AST: 32 U/L (ref 15–41)
Albumin: 2.6 g/dL — ABNORMAL LOW (ref 3.5–5.0)
Alkaline Phosphatase: 46 U/L (ref 38–126)
Anion gap: 11 (ref 5–15)
BUN: 32 mg/dL — ABNORMAL HIGH (ref 8–23)
CO2: 26 mmol/L (ref 22–32)
Calcium: 8 mg/dL — ABNORMAL LOW (ref 8.9–10.3)
Chloride: 98 mmol/L (ref 98–111)
Creatinine, Ser: 1.17 mg/dL — ABNORMAL HIGH (ref 0.44–1.00)
GFR, Estimated: 47 mL/min — ABNORMAL LOW (ref >60)
Glucose, Bld: 62 mg/dL — ABNORMAL LOW (ref 70–99)
Potassium: 3.6 mmol/L (ref 3.5–5.1)
Sodium: 135 mmol/L (ref 135–145)
Total Bilirubin: 0.7 mg/dL (ref 0.3–1.2)
Total Protein: 6 g/dL — ABNORMAL LOW (ref 6.5–8.1)

## 2021-04-22 LAB — GLUCOSE, CAPILLARY
Glucose-Capillary: 192 mg/dL — ABNORMAL HIGH (ref 70–99)
Glucose-Capillary: 106 mg/dL — ABNORMAL HIGH (ref 70–99)
Glucose-Capillary: 177 mg/dL — ABNORMAL HIGH (ref 70–99)
Glucose-Capillary: 145 mg/dL — ABNORMAL HIGH (ref 70–99)

## 2021-04-22 LAB — CBC WITH DIFFERENTIAL/PLATELET
Abs Immature Granulocytes: 0.19 10*3/uL — ABNORMAL HIGH (ref 0.00–0.07)
Basophils Absolute: 0 10*3/uL (ref 0.0–0.1)
Basophils Relative: 1 %
Eosinophils Absolute: 0.1 10*3/uL (ref 0.0–0.5)
Eosinophils Relative: 2 %
HCT: 45.1 % (ref 36.0–46.0)
Hemoglobin: 14.6 g/dL (ref 12.0–15.0)
Immature Granulocytes: 2 %
Lymphocytes Relative: 30 %
Lymphs Abs: 2.6 10*3/uL (ref 0.7–4.0)
MCH: 30.5 pg (ref 26.0–34.0)
MCHC: 32.4 g/dL (ref 30.0–36.0)
MCV: 94.2 fL (ref 80.0–100.0)
Monocytes Absolute: 1.2 10*3/uL — ABNORMAL HIGH (ref 0.1–1.0)
Monocytes Relative: 14 %
Neutro Abs: 4.5 10*3/uL (ref 1.7–7.7)
Neutrophils Relative %: 51 %
Platelets: 179 10*3/uL (ref 150–400)
RBC: 4.79 MIL/uL (ref 3.87–5.11)
RDW: 14.9 % (ref 11.5–15.5)
WBC: 8.7 10*3/uL (ref 4.0–10.5)
nRBC: 0 % (ref 0.0–0.2)

## 2021-04-22 LAB — MAGNESIUM: Magnesium: 2.5 mg/dL — ABNORMAL HIGH (ref 1.7–2.4)

## 2021-04-22 LAB — PROCALCITONIN: Procalcitonin: <0.1 ng/mL

## 2021-04-22 LAB — BRAIN NATRIURETIC PEPTIDE: B Natriuretic Peptide: 47.4 pg/mL (ref 0.0–100.0)

## 2021-04-22 LAB — C-REACTIVE PROTEIN: CRP: 3.8 mg/dL — ABNORMAL HIGH (ref <1.0)

## 2021-04-22 MED ORDER — INSULIN ASPART PROT & ASPART (70-30 MIX) 100 UNIT/ML ~~LOC~~ SUSP
32.0000 [IU] | Freq: Two times a day (BID) | SUBCUTANEOUS | Status: DC
  Administered 2021-04-22 – 2021-04-23 (×2): 32 [IU] via SUBCUTANEOUS

## 2021-04-22 MED ORDER — BISACODYL 10 MG RE SUPP
10.0000 mg | Freq: Every day | RECTAL | Status: DC | PRN
  Administered 2021-04-22: 10 mg via RECTAL
  Filled 2021-04-22: qty 1

## 2021-04-22 MED ORDER — POLYETHYLENE GLYCOL 3350 17 G PO PACK
17.0000 g | PACK | Freq: Every day | ORAL | Status: DC
  Administered 2021-04-22 – 2021-04-23 (×2): 17 g via ORAL
  Filled 2021-04-22 (×2): qty 1

## 2021-04-22 MED ORDER — FLEET ENEMA 7-19 GM/118ML RE ENEM
1.0000 | ENEMA | Freq: Once | RECTAL | Status: AC
  Administered 2021-04-22: 1 via RECTAL
  Filled 2021-04-22: qty 1

## 2021-04-22 NOTE — Progress Notes (Signed)
RN placed patient on home CPAP.

## 2021-04-22 NOTE — Progress Notes (Signed)
PROGRESS NOTE                                                                                                                                                                                                             Patient Demographics:    Heidi Stark, is a 80 y.o. female, DOB - July 18, 1940, JSE:[id number]  Outpatient Primary MD for the patient is Ann Held, DO    LOS - 7  Admit date - 04/15/2021    Chief Complaint  Patient presents with   Shortness of Breath       Brief Narrative (HPI from H&P)    Heidi Stark is a 80 y.o. 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  Of note she is not vaccinated.   Subjective:   Less coughing   Assessment  & Plan :      Acute Hypoxic Resp. Failure due to Acute Covid 19 Viral Pneumonitis during the ongoing 2020 Covid 19 Pandemic  - moderate to severe respiratory involvement, currently on nasal cannula oxygen, has been placed on steroids and Remdesivir much better, titrate down o2, uses PRN o2 at home, -pro calcitonin trending up: add rocephin to azithro Encouraged the patient to sit up in chair in the daytime use I-S and flutter valve for pulmonary toiletry.  Will advance activity and titrate down oxygen as possible.   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  Continue Entresto at half home dose, home  dose anticoagulation continued, beta-blocker held due to episodes of bradycardia, case discussed with Dr. Peter Martinique, EP on-call, home dose amiodarone continued as she has history of V. tach, monitor on telemetry, she is stable and asymptomatic, stable TSH.  Acute on chronic diastolic heart failure EF 50%, VT.  She has a single chamber MDT ICD.  Was dehydrated  and hypotensive, now slight overload  DC beta-blocker as she is bradycardic, Entresto half home dose and monitor.   Underlying history of COPD as needed 2 L of oxygen at home  OSA.  CPAP at night.  History of morbid obesity Estimated body mass index is 42.77 kg/m as calculated from the following:   Height as of this encounter: 5\' 6"  (1.676 m).   Weight as of this encounter: 120.2 kg.    Dehydration with hyponatremia.  resolved    Mild elevation in troponin due to demand ischemia from hypoxia.  No chest pain or acute ST changes.  No further work-up.   V. tach in the past.  Has AICD.  Continue amiodarone for now.   Dyslipidemia.  On statin continue.  DM type II.  On 70/30, Premeal NovoLog and sliding scale, dose adjsuted. Steroids being tapered.            Code Status :  Full  Consults  :  None        Disposition Plan  :    Status is: Inpatient  Remains inpatient appropriate because:IV treatments appropriate due to intensity of illness or inability to take PO  Dispo: The patient is from: Home              Anticipated d/c is to: Home              Patient currently is not medically stable to d/c. Home in AM?   Difficult to place patient Yes  Inpatient Medications  Scheduled Meds:  amiodarone  200 mg Oral Daily   apixaban  5 mg Oral BID   vitamin C  500 mg Oral Daily   azithromycin  500 mg Oral Daily   dapagliflozin propanediol  5 mg Oral Daily   fenofibrate  160 mg Oral Daily   furosemide  40 mg Oral Daily   icosapent Ethyl  2 g Oral BID   insulin aspart  0-15 Units Subcutaneous TID WC   insulin  aspart  0-5 Units Subcutaneous QHS   insulin aspart protamine- aspart  32 Units Subcutaneous BID WC   Ipratropium-Albuterol  1 puff Inhalation TID   mouth rinse  15 mL Mouth Rinse BID   melatonin  5 mg Oral QHS   multivitamin with minerals  1 tablet Oral Daily   rosuvastatin  20 mg Oral Daily   sacubitril-valsartan  1 tablet Oral BID   sodium chloride flush  3 mL Intravenous Q12H   zinc sulfate  220 mg Oral Daily   Continuous Infusions:  cefTRIAXone (ROCEPHIN)  IV 1 g (04/21/21 1400)    PRN Meds:.acetaminophen, albuterol, guaiFENesin-dextromethorphan  Antibiotics  :    Anti-infectives (From admission, onward)    Start     Dose/Rate Route Frequency Ordered Stop   04/21/21 1100  cefTRIAXone (ROCEPHIN) 1 g in sodium chloride 0.9 % 100 mL IVPB        1 g 200 mL/hr over 30 Minutes Intravenous Every 24 hours 04/21/21 1001     04/19/21 1130  azithromycin (ZITHROMAX) tablet 500 mg        500 mg Oral Daily 04/19/21 1037     04/16/21 1445  azithromycin (ZITHROMAX) tablet 500 mg  Status:  Discontinued       See Hyperspace for full Linked Orders Report.   500 mg Oral Daily 04/15/21 1437 04/16/21 1035   04/16/21 1000  remdesivir 100 mg in sodium chloride 0.9 % 100 mL IVPB  See Hyperspace for full Linked Orders Report.   100 mg 200 mL/hr over 30 Minutes Intravenous Daily 04/15/21 1324 04/19/21 0958   04/16/21 1000  remdesivir 100 mg in sodium chloride 0.9 % 100 mL IVPB  Status:  Discontinued       See Hyperspace for full Linked Orders Report.   100 mg 200 mL/hr over 30 Minutes Intravenous Daily 04/15/21 1437 04/15/21 1438   04/15/21 1500  remdesivir 200 mg in sodium chloride 0.9% 250 mL IVPB       See Hyperspace for full Linked Orders Report.   200 mg 580 mL/hr over 30 Minutes Intravenous Once 04/15/21 1324 04/15/21 1722   04/15/21 1445  azithromycin (ZITHROMAX) 500 mg in sodium chloride 0.9 % 250 mL IVPB       See Hyperspace for full Linked Orders Report.   500 mg 250 mL/hr over  60 Minutes Intravenous Every 24 hours 04/15/21 1437 04/15/21 1737   04/15/21 1445  remdesivir 200 mg in sodium chloride 0.9% 250 mL IVPB  Status:  Discontinued       See Hyperspace for full Linked Orders Report.   200 mg 580 mL/hr over 30 Minutes Intravenous Once 04/15/21 1437 04/15/21 1438        Time Spent in minutes  [place] DO on 04/22/2021 at 11:22 AM      Objective:   Vitals:   04/21/21 2200 04/22/21 0046 04/22/21 0436 04/22/21 0809  BP:  (!) 112/53 116/61 (!) 101/45  Pulse:  (!) 47 (!) 59 (!) 53  Resp:  17 17 16   Temp:  (!) 97.5 F (36.4 C) 97.6 F (36.4 C) 97.8 F (36.6 C)  TempSrc:  Oral Axillary Oral  SpO2: 95% 95% 99% 99%  Weight:      Height:        Wt Readings from Last 3 Encounters:  04/16/21 120.2 kg  01/27/21 120.5 kg  12/20/20 119.3 kg     Intake/Output Summary (Last 24 hours) at 04/22/2021 1122 Last data filed at 04/22/2021 4174 Gross per 24 hour  Intake 340 ml  Output 1000 ml  Net -660 ml     Physical Exam   General: Appearance:    Severely obese female in no acute distress     Lungs:     respirations unlabored- down to 2L  Heart:    Bradycardic.    MS:   All extremities are intact.    Neurologic:   Awake, alert, oriented x 3. No apparent focal neurological           defect.        Data Review:    CBC Recent Labs  Lab 04/18/21 0059 04/19/21 0135 04/20/21 0141 04/21/21 0141 04/22/21 0146  WBC 5.0 5.1 13.9* 10.1 8.7  HGB 14.8 15.4* 15.5* 14.4 14.6  HCT 46.4* 46.6* 46.9* 45.1 45.1  PLT 170 189 183 213 179  MCV 94.9 93.2 93.2 94.7 94.2  MCH 30.3 30.8 30.8 30.3 30.5  MCHC 31.9 33.0 33.0 31.9 32.4  RDW 14.8 14.8 15.0 14.8 14.9  LYMPHSABS 0.9 0.9 2.1 2.5 2.6  MONOABS 0.7 0.5 1.6* 1.6* 1.2*  EOSABS 0.1 0.1 0.0 0.1 0.1  BASOSABS 0.0 0.0 0.0 0.0 0.0    Recent Labs  Lab 04/15/21 1430 04/15/21 1430 04/15/21 1440 04/16/21 0047 04/17/21 0116 04/18/21 0059 04/19/21 0135 04/20/21 0141 04/21/21 0141  04/22/21 0146  NA  --    < >  --  131* 133* 134* 134*  134* 135 135  K  --    < >  --  4.2 4.8 5.0 4.4 4.2 3.7 3.6  CL  --    < >  --  96* 97* 100 98 97* 94* 98  CO2  --    < >  --  23 24 27 26 28  32 26  GLUCOSE  --    < >  --  325* 212* 167* 123* 68* 73 62*  BUN  --    < >  --  26* 34* 30* 30* 30* 29* 32*  CREATININE  --    < >  --  1.33* 1.36* 1.16* 0.96 0.99 1.10* 1.17*  CALCIUM  --    < >  --  8.2* 8.7* 8.4* 8.5* 8.1* 8.2* 8.0*  AST 42*  --   --  41 34 29 30 33 32 32  ALT 23  --   --  26 25 22 24 25 25 26   ALKPHOS 44  --   --  52 42 42 46 46 47 46  BILITOT 0.8  --   --  0.6 0.4 0.6 0.5 0.4 0.6 0.7  ALBUMIN 3.0*  --   --  3.0* 2.7* 2.6* 2.7* 2.8* 2.6* 2.6*  MG 2.3  --   --  2.3 2.2 2.3 2.3 2.5* 2.5* 2.5*  CRP 3.7*  --   --  4.2* 2.7* 1.1* 0.7 3.4* 5.3* 3.8*  DDIMER 0.49  --   --  0.51* 0.48 0.35 0.37  --   --   --   PROCALCITON  --    < >  --  <0.10 <0.10 <0.10 <0.10 <0.10 1.80 <0.10  TSH  --   --   --   --   --  1.188  --   --   --   --   HGBA1C  --   --  9.0*  --   --   --   --   --   --   --   BNP  --    < >  --  72.0 94.3 96.7 84.5 89.9 60.7 47.4   < > = values in this interval not displayed.    ------------------------------------------------------------------------------------------------------------------ No results for input(s): CHOL, HDL, LDLCALC, TRIG, CHOLHDL, LDLDIRECT in the last 72 hours.  Lab Results  Component Value Date   HGBA1C 9.0 (H) 04/15/2021   ------------------------------------------------------------------------------------------------------------------ No results for input(s): TSH, T4TOTAL, T3FREE, THYROIDAB in the last 72 hours.  Invalid input(s): FREET3   Cardiac Enzymes No results for input(s): CKMB, TROPONINI, MYOGLOBIN in the last 168 hours.  Invalid input(s): CK ------------------------------------------------------------------------------------------------------------------    Component Value Date/Time   BNP 47.4 04/22/2021 0146      Radiology Reports DG Chest 2 View  Result Date: 04/15/2021 CLINICAL DATA:  Shortness of breath x2 days. EXAM: CHEST - 2 VIEW COMPARISON:  September 16, 2020 FINDINGS: A single lead ventricular pacer is noted. Mild, diffuse, chronic appearing increased interstitial lung markings are noted. There is no evidence of acute infiltrate, pleural effusion or pneumothorax. The cardiac silhouette is moderately enlarged and unchanged in size. The visualized skeletal structures are unremarkable. IMPRESSION: Stable cardiomegaly with chronic appearing increased interstitial lung markings. Electronically Signed   By: Virgina Norfolk M.D.   On: 04/15/2021 03:12   DG Chest Port 1 View  Result Date: 04/20/2021 CLINICAL DATA:  Shortness of breath EXAM: PORTABLE CHEST 1 VIEW COMPARISON:  Chest radiograph 04/17/2021 FINDINGS: A left chest wall cardiac device  and single lead are stable. The heart is mildly enlarged, unchanged. The mediastinal contours are stable. There is mild vascular congestion without definite overt pulmonary edema. Aeration of the lung bases has significantly improved. There is no new focal airspace disease. There is no significant pleural effusion. There is no pneumothorax. The bones are stable. IMPRESSION: Improved pulmonary edema with improved aeration of the lung bases since 04/17/2021 Electronically Signed   By: Valetta Mole M.D.   On: 04/20/2021 08:45   DG Chest Port 1 View  Result Date: 04/17/2021 CLINICAL DATA:  Shortness of breath EXAM: PORTABLE CHEST 1 VIEW COMPARISON:  April 16, 2021 FINDINGS: Stable cardiomegaly. Stable AICD device. The hila and mediastinum are unremarkable. No pneumothorax. Diffuse mild increased interstitial opacities. More focal mild opacities in the bases. No other acute abnormalities. IMPRESSION: 1. Findings are most suggestive of cardiomegaly with mild edema. The more focal opacity in the right base may represent atelectasis or developing infiltrate. Recommend  attention on follow-up. Electronically Signed   By: Dorise Bullion III M.D.   On: 04/17/2021 07:44   DG Chest Port 1 View  Result Date: 04/16/2021 CLINICAL DATA:  Shortness of breath EXAM: PORTABLE CHEST 1 VIEW COMPARISON:  04/16/2019 FINDINGS: Cardiomegaly. Single chamber pacer lead into the right ventricle. Artifact from EKG leads. Mild interstitial coarsening. Airway thickening and paraseptal emphysema by 2018 chest CT. IMPRESSION: Baseline appearance of the chest.  No acute finding. Electronically Signed   By: Jorje Guild M.D.   On: 04/16/2021 07:37

## 2021-04-22 NOTE — Progress Notes (Signed)
Occupational Therapy Treatment Patient Details Name: Heidi Stark MRN: [id number] DOB: 01-09-1941 Today's Date: 04/22/2021   History of present illness 80 y.o. female who presented to ED 04/15/21 with shortness of breath. +COVID   PMH significant of PAF, hypertension, hyperlipidemia, COPD, systolic heart failure, type 2 diabetes, OSA on CPAP, history of DVT, CKD stage III, hx of VT s/p ICD placement   OT comments  Pt received in recliner on 2lnc. Pt agreeable to OT session. Pt currently requires supervision for functional mobility with ADL completion while standing. SpO2 95% on 2lnc at rest, SpO2 88% (poor wave form) following in room mobility, pt quickly rebounded to 93% O2 with seated rest break and pursed lip breathing. Pt educated on energy conservation strategies and activity progression this date. Pt will continue to benefit from skilled OT services to maximize safety and independence with ADL/IADL and functional mobility. Will continue to follow acutely and progress as tolerated.     Recommendations for follow up therapy are one component of a multi-disciplinary discharge planning process, led by the attending physician.  Recommendations may be updated based on patient status, additional functional criteria and insurance authorization.    Follow Up Recommendations  No OT follow up    Equipment Recommendations  None recommended by OT    Recommendations for Other Services      Precautions / Restrictions Precautions Precautions: Fall Precaution Comments: Watch HR, BP and O2 sats       Mobility Bed Mobility               General bed mobility comments: pt in recliner upon arrival    Transfers Overall transfer level: Needs assistance Equipment used: Rolling walker (2 wheeled) Transfers: Sit to/from [facility] Sit to Stand: Modified independent (Device/Increase time) Stand pivot transfers: Supervision       General transfer comment: supervision for  safety    Balance Overall balance assessment: Needs assistance Sitting-balance support: Feet supported Sitting balance-Leahy Scale: Good     Standing balance support: Single extremity supported;During functional activity Standing balance-Leahy Scale: Fair Standing balance comment: reliant on at least single UE support for dynamic balance while standing;able to static stand without UE support                           ADL either performed or assessed with clinical judgement   ADL Overall ADL's : Needs assistance/impaired Eating/Feeding: Set up;Sitting   Grooming: Supervision/safety;Standing Grooming Details (indicate cue type and reason): at sink level                 Toilet Transfer: Supervision/safety;RW;Ambulation [employer] forces technical [employer] Details (indicate cue type and reason): supervision for safety and line management Toileting- Clothing Manipulation and Hygiene: Supervision/safety;Sit to/from stand       Functional mobility during ADLs: Supervision/safety;Rolling walker General ADL Comments: with O2 tank     Vision       Perception     Praxis      Cognition Arousal/Alertness: Awake/alert Behavior During Therapy: Flat affect Overall Cognitive Status: Within Functional Limits for tasks assessed                                          Exercises Other Exercises Other Exercises: continued to reinforce use of IS, pt reports frequent use   Shoulder Instructions  General Comments SpO2 88% on 2lnc with exertion, able to rebound O2 quickly to 92% with seated rest break.Began education on energy conservation strategies.    Pertinent Vitals/ Pain       Pain Assessment: No/denies pain  Home Living                                          Prior Functioning/Environment              Frequency  Min 2X/week        Progress Toward Goals  OT Goals(current goals can now be found in the care plan section)   Progress towards OT goals: Progressing toward goals  Acute Rehab OT Goals Patient Stated Goal: Return home OT Goal Formulation: With patient Time For Goal Achievement: 04/30/21 Potential to Achieve Goals: Good ADL Goals Pt Will Perform Grooming: with set-up;sitting;standing Pt Will Perform Lower Body Bathing: with set-up;sit to/from stand Pt Will Perform Lower Body Dressing: with set-up;sit to/from stand Pt Will Transfer to Toilet: with modified independence;ambulating;regular height toilet  Plan Discharge plan remains appropriate    Co-evaluation                 AM-PAC OT "6 Clicks" Daily Activity     Outcome Measure   Help from another person eating meals?: None Help from another person taking care of personal grooming?: None Help from another person toileting, which includes using toliet, bedpan, or urinal?: A Little Help from another person bathing (including washing, rinsing, drying)?: A Little Help from another person to put on and taking off regular upper body clothing?: None Help from another person to put on and taking off regular lower body clothing?: A Little 6 Click Score: 21    End of Session Equipment Utilized During Treatment: Rolling walker;Oxygen  OT Visit Diagnosis: Unsteadiness on feet (R26.81);Muscle weakness (generalized) (M62.81)   Activity Tolerance Patient tolerated treatment well   Patient Left in chair;with call bell/phone within reach   Nurse Communication Mobility status        Time: 1040-1105 OT Time Calculation (min): 25 min  Charges: OT General Charges $OT Visit: 1 Visit OT Treatments $Self Care/Home Management : 23-37 mins  Helene Kelp OTR/L Acute Rehabilitation Services Office: [place] 04/22/2021, 11:23 AM

## 2021-04-22 NOTE — Progress Notes (Signed)
Patient using home CPAP independently.  

## 2021-04-22 NOTE — Progress Notes (Signed)
PT Cancellation Note  Patient Details Name: Heidi Stark MRN: [id number] DOB: 11-19-1940   Cancelled Treatment:    Reason Eval/Treat Not Completed: Other (comment).  Declines PT today, will retry at another time.   Ramond Dial 04/22/2021, 2:15 PM  Mee Hives, PT PhD Acute Rehab Dept. Number: [place]

## 2021-04-23 DIAGNOSIS — U071 COVID-19: Secondary | ICD-10-CM | POA: Diagnosis not present

## 2021-04-23 LAB — COMPREHENSIVE METABOLIC PANEL
ALT: 23 U/L (ref 0–44)
AST: 32 U/L (ref 15–41)
Albumin: 2.5 g/dL — ABNORMAL LOW (ref 3.5–5.0)
Alkaline Phosphatase: 45 U/L (ref 38–126)
Anion gap: 8 (ref 5–15)
BUN: 25 mg/dL — ABNORMAL HIGH (ref 8–23)
CO2: 30 mmol/L (ref 22–32)
Calcium: 8.2 mg/dL — ABNORMAL LOW (ref 8.9–10.3)
Chloride: 98 mmol/L (ref 98–111)
Creatinine, Ser: 1.04 mg/dL — ABNORMAL HIGH (ref 0.44–1.00)
GFR, Estimated: 55 mL/min — ABNORMAL LOW (ref >60)
Glucose, Bld: 75 mg/dL (ref 70–99)
Potassium: 3.8 mmol/L (ref 3.5–5.1)
Sodium: 136 mmol/L (ref 135–145)
Total Bilirubin: 0.6 mg/dL (ref 0.3–1.2)
Total Protein: 6.1 g/dL — ABNORMAL LOW (ref 6.5–8.1)

## 2021-04-23 LAB — CBC WITH DIFFERENTIAL/PLATELET
Abs Immature Granulocytes: 0.14 10*3/uL — ABNORMAL HIGH (ref 0.00–0.07)
Basophils Absolute: 0 10*3/uL (ref 0.0–0.1)
Basophils Relative: 0 %
Eosinophils Absolute: 0.1 10*3/uL (ref 0.0–0.5)
Eosinophils Relative: 1 %
HCT: 42.9 % (ref 36.0–46.0)
Hemoglobin: 14.1 g/dL (ref 12.0–15.0)
Immature Granulocytes: 2 %
Lymphocytes Relative: 26 %
Lymphs Abs: 2.4 10*3/uL (ref 0.7–4.0)
MCH: 30.9 pg (ref 26.0–34.0)
MCHC: 32.9 g/dL (ref 30.0–36.0)
MCV: 94.1 fL (ref 80.0–100.0)
Monocytes Absolute: 1.3 10*3/uL — ABNORMAL HIGH (ref 0.1–1.0)
Monocytes Relative: 14 %
Neutro Abs: 5.1 10*3/uL (ref 1.7–7.7)
Neutrophils Relative %: 57 %
Platelets: 240 10*3/uL (ref 150–400)
RBC: 4.56 MIL/uL (ref 3.87–5.11)
RDW: 14.9 % (ref 11.5–15.5)
WBC: 9 10*3/uL (ref 4.0–10.5)
nRBC: 0 % (ref 0.0–0.2)

## 2021-04-23 LAB — MAGNESIUM: Magnesium: 2.5 mg/dL — ABNORMAL HIGH (ref 1.7–2.4)

## 2021-04-23 LAB — CULTURE, RESPIRATORY W GRAM STAIN

## 2021-04-23 LAB — BRAIN NATRIURETIC PEPTIDE: B Natriuretic Peptide: 61.7 pg/mL (ref 0.0–100.0)

## 2021-04-23 LAB — PROCALCITONIN: Procalcitonin: <0.1 ng/mL

## 2021-04-23 LAB — C-REACTIVE PROTEIN: CRP: 2 mg/dL — ABNORMAL HIGH (ref <1.0)

## 2021-04-23 LAB — GLUCOSE, CAPILLARY
Glucose-Capillary: 243 mg/dL — ABNORMAL HIGH (ref 70–99)
Glucose-Capillary: 153 mg/dL — ABNORMAL HIGH (ref 70–99)

## 2021-04-23 MED ORDER — ALBUTEROL SULFATE HFA 108 (90 BASE) MCG/ACT IN AERS: 2.0000 | INHALATION_SPRAY | RESPIRATORY_TRACT | 0 refills | Status: DC | PRN

## 2021-04-23 MED ORDER — DOXYCYCLINE HYCLATE 100 MG PO TABS: 100.0000 mg | ORAL_TABLET | Freq: Two times a day (BID) | ORAL | 0 refills | Status: DC

## 2021-04-23 MED ORDER — LEVOFLOXACIN 500 MG PO TABS: 500.0000 mg | ORAL_TABLET | Freq: Every day | ORAL | 0 refills | Status: AC

## 2021-04-23 MED ORDER — DOXYCYCLINE HYCLATE 100 MG PO TABS
100.0000 mg | ORAL_TABLET | Freq: Two times a day (BID) | ORAL | Status: DC
  Administered 2021-04-23: 100 mg via ORAL
  Filled 2021-04-23: qty 1

## 2021-04-23 NOTE — Discharge Summary (Signed)
Physician Discharge Summary  Heidi Stark QPR:[id number] DOB: [birth year] DOA: 04/15/2021  PCP: Ann Held, DO  Admit date: 04/15/2021 Discharge date: 04/23/2021  Admitted From: home Discharge disposition: home   Recommendations for Outpatient Follow-Up:   Will treat with Levaquin as sputum culture shows pseudomonas (may need to extend course if not improved)- patient says she tolerates levaquin D/c'd on O2 for now- wean as able BB held due to bradycardia   Discharge Diagnosis:   Principal Problem:   COVID-19 Active Problems:   HLD (hyperlipidemia)   Obstructive sleep apnea   Essential hypertension   COPD (chronic obstructive pulmonary disease) (HCC)   PAF (paroxysmal atrial fibrillation) (HCC)   Type 2 diabetes mellitus with hyperglycemia, with long-term current use of insulin (HCC)   VT (ventricular tachycardia)   Respiratory failure with hypoxia (HCC)   Systolic CHF ([place])   CKD (chronic kidney disease) stage 3, GFR 30-59 ml/min (HCC)   Elevated troponin    Discharge Condition: Improved.  Diet recommendation: Low sodium, heart healthy.  Carbohydrate-modified.  Wound care: None.  Code status: Full.   History of Present Illness:   Heidi Stark is a 80 y.o. 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 She states she first noticed her shortness of breath in the AM. She had this at rest, but was worse with exertion. She does not wear any oxygen at home. She would have to sit down and rest, but would stay short of breath and breathing hard. She has had no fever/chills. She has had a cough with increased  sputum production that is yellow in color. Denies any wheezing, but has had a tightness in chest.  She denies any headaches, vision changes, chest pain, palpitations, stomach, N/V/D, leg swelling, weight gain or rashes.  Denies any sick contacts. Did not take any otc medication yesterday.  States she has gotten a lot of relief with breathing treatment given via EMS and in ED. Was wheezing with chest tightness in triage in ED   She has no vaccines for covid or flu.       Hospital Course by Problem:   Acute Hypoxic Resp. Failure due to Acute Covid 19 Viral Pneumonitis during the ongoing 2020 Covid 19 Pandemic  - moderate to severe respiratory involvement, currently on nasal cannula oxygen, has been placed on steroids and Remdesivir much better, titrate down o2, uses PRN o2 at home, -pro calcitonin trending up: treated with abx-- change to levaquin for pseudomonas in culture Encouraged the patient to sit up in chair in the daytime use I-S and flutter valve for pulmonary toiletry.  Will advance activity and titrate down oxygen as possible.    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  Continue Entresto at half home dose, home dose anticoagulation continued, beta-blocker held due to episodes of bradycardia, case discussed with Dr. Peter Martinique, EP on-call, home dose amiodarone continued as she has history of V. tach, monitor on telemetry, she is stable and asymptomatic, stable TSH.   Acute on chronic diastolic  heart failure EF 50%, VT.  She has a single chamber MDT ICD.  Was dehydrated and hypotensive, now slight overload  DC beta-blocker as she is bradycardic, Entresto half home dose and monitor.    Underlying history of COPD as needed 2 L of oxygen at home   OSA.  CPAP at night.   History of morbid obesity Estimated body mass index is 42.77 kg/m as  calculated from the following:   Height as of this encounter: 5\' 6"  (1.676 m).   Weight as of this encounter: 120.2 kg.     Dehydration with hyponatremia.  resolved     Mild elevation in troponin due to demand ischemia from hypoxia.  No chest pain or acute ST changes.  No further work-up.    V. tach in the past.  Has AICD.  Continue amiodarone for now.    Dyslipidemia.  On statin continue.   DM type II.  On 70/30, Premeal NovoLog and sliding scale, dose adjsuted. Steroids tapered to off      Medical Consultants:      Discharge Exam:   Vitals:   04/23/21 0743 04/23/21 1011  BP: (!) 105/54   Pulse: 60 (!) 52  Resp: 18 20  Temp: 98.4 F (36.9 C)   SpO2: 97%    Vitals:   04/23/21 0400 04/23/21 0416 04/23/21 0743 04/23/21 1011  BP: (!) 105/55  (!) 105/54   Pulse:   60 (!) 52  Resp:   18 20  Temp:   98.4 F (36.9 C)   TempSrc:   Oral   SpO2: 95% 91% 97%   Weight:      Height:        General exam: Appears calm and comfortable.     The results of significant diagnostics from this hospitalization (including imaging, microbiology, ancillary and laboratory) are listed below for reference.     Procedures and Diagnostic Studies:   DG Chest 2 View  Result Date: 04/15/2021 CLINICAL DATA:  Shortness of breath x2 days. EXAM: CHEST - 2 VIEW COMPARISON:  September 16, 2020 FINDINGS: A single lead ventricular pacer is noted. Mild, diffuse, chronic appearing increased interstitial lung markings are noted. There is no evidence of acute infiltrate, pleural effusion or pneumothorax. The cardiac silhouette is moderately enlarged and unchanged in size. The visualized skeletal structures are unremarkable. IMPRESSION: Stable cardiomegaly with chronic appearing increased interstitial lung markings. Electronically Signed   By: Virgina Norfolk M.D.   On: 04/15/2021 03:12   DG Chest Port 1 View  Result Date: 04/16/2021 CLINICAL DATA:  Shortness of breath EXAM: PORTABLE CHEST 1 VIEW  COMPARISON:  04/16/2019 FINDINGS: Cardiomegaly. Single chamber pacer lead into the right ventricle. Artifact from EKG leads. Mild interstitial coarsening. Airway thickening and paraseptal emphysema by 2018 chest CT. IMPRESSION: Baseline appearance of the chest.  No acute finding. Electronically Signed   By: Jorje Guild M.D.   On: 04/16/2021 07:37     Labs:   Basic Metabolic Panel: Recent Labs  Lab 04/19/21 0135 04/20/21 0141 04/21/21 0141 04/22/21 0146 04/23/21 0117  NA 134* 134* 135 135 136  K 4.4 4.2 3.7 3.6 3.8  CL 98 97* 94* 98 98  CO2 26 28 32 26 30  GLUCOSE 123* 68* 73 62* 75  BUN 30* 30* 29* 32* 25*  CREATININE 0.96 0.99 1.10* 1.17* 1.04*  CALCIUM 8.5* 8.1* 8.2* 8.0* 8.2*  MG 2.3 2.5* 2.5* 2.5* 2.5*   GFR Estimated Creatinine Clearance: 58 mL/min (A) (by C-G formula  based on SCr of 1.04 mg/dL (H)). Liver Function Tests: Recent Labs  Lab 04/19/21 0135 04/20/21 0141 04/21/21 0141 04/22/21 0146 04/23/21 0117  AST 30 33 32 32 32  ALT 24 25 25 26 23   ALKPHOS 46 46 47 46 45  BILITOT 0.5 0.4 0.6 0.7 0.6  PROT 6.4* 6.4* 6.1* 6.0* 6.1*  ALBUMIN 2.7* 2.8* 2.6* 2.6* 2.5*   No results for input(s): LIPASE, AMYLASE in the last 168 hours. No results for input(s): AMMONIA in the last 168 hours. Coagulation profile No results for input(s): INR, PROTIME in the last 168 hours.  CBC: Recent Labs  Lab 04/19/21 0135 04/20/21 0141 04/21/21 0141 04/22/21 0146 04/23/21 0117  WBC 5.1 13.9* 10.1 8.7 9.0  NEUTROABS 3.7 10.1* 5.9 4.5 5.1  HGB 15.4* 15.5* 14.4 14.6 14.1  HCT 46.6* 46.9* 45.1 45.1 42.9  MCV 93.2 93.2 94.7 94.2 94.1  PLT 189 183 213 179 240   Cardiac Enzymes: No results for input(s): CKTOTAL, CKMB, CKMBINDEX, TROPONINI in the last 168 hours. BNP: Invalid input(s): POCBNP CBG: Recent Labs  Lab 04/22/21 1212 04/22/21 1822 04/22/21 2115 04/23/21 0748 04/23/21 1244  GLUCAP 106* 177* 145* 243* 153*   D-Dimer No results for input(s): DDIMER in the  last 72 hours. Hgb A1c No results for input(s): HGBA1C in the last 72 hours. Lipid Profile No results for input(s): CHOL, HDL, LDLCALC, TRIG, CHOLHDL, LDLDIRECT in the last 72 hours. Thyroid function studies No results for input(s): TSH, T4TOTAL, T3FREE, THYROIDAB in the last 72 hours.  Invalid input(s): FREET3 Anemia work up No results for input(s): VITAMINB12, FOLATE, FERRITIN, TIBC, IRON, RETICCTPCT in the last 72 hours. Microbiology Recent Results (from the past 240 hour(s))  Resp Panel by RT-PCR (Flu A&B, Covid) Nasopharyngeal Swab     Status: Abnormal   Collection Time: 04/15/21  2:24 AM   Specimen: Nasopharyngeal Swab; Nasopharyngeal(NP) swabs in vial transport medium  Result Value Ref Range Status   SARS Coronavirus 2 by RT PCR POSITIVE (A) NEGATIVE Final    Comment: RESULT CALLED TO, READ BACK BY AND VERIFIED WITH: M FOWLER,RN@[id number]  04/15/21 [place] (NOTE) SARS-CoV-2 target nucleic acids are DETECTED.  The SARS-CoV-2 RNA is generally detectable in upper respiratory specimens during the acute phase of infection. Positive results are indicative of the presence of the identified virus, but do not rule out bacterial infection or co-infection with other pathogens not detected by the test. Clinical correlation with patient history and other diagnostic information is necessary to determine patient infection status. The expected result is Negative.  Fact Sheet for Patients: EntrepreneurPulse.com.au  Fact Sheet for Healthcare Providers: IncredibleEmployment.be  This test is not yet approved or cleared by the [place] FDA and  has been authorized for detection and/or diagnosis of SARS-CoV-2 by FDA under an Emergency Use Authorization (EUA).  This EUA will remain in effect (meaning this test can be used ) for the duration of  the COVID-19 declaration under Section 564(b)(1) of the Act, 21 U.S.C. section 360bbb-3(b)(1), unless the authorization  is terminated or revoked sooner.     Influenza A by PCR NEGATIVE NEGATIVE Final   Influenza B by PCR NEGATIVE NEGATIVE Final    Comment: (NOTE) The Xpert Xpress SARS-CoV-2/FLU/RSV plus assay is intended as an aid in the diagnosis of influenza from Nasopharyngeal swab specimens and should not be used as a sole basis for treatment. Nasal washings and aspirates are unacceptable for Xpert Xpress SARS-CoV-2/FLU/RSV testing.  Fact Sheet for Patients: EntrepreneurPulse.com.au  Fact Sheet for Healthcare  Providers: IncredibleEmployment.be  This test is not yet approved or cleared by the [place] and has been authorized for detection and/or diagnosis of SARS-CoV-2 by FDA under an Emergency Use Authorization (EUA). This EUA will remain in effect (meaning this test can be used) for the duration of the COVID-19 declaration under Section 564(b)(1) of the Act, 21 U.S.C. section 360bbb-3(b)(1), unless the authorization is terminated or revoked.  Performed at [facility], [place] [address], [place], [place] [address]   Expectorated Sputum Assessment w Gram Stain, Rflx to Resp Cult     Status: None   Collection Time: 04/20/21  2:31 PM   Specimen: Sputum  Result Value Ref Range Status   Specimen Description SPUTUM  Final   Special Requests EXPECTORATED  Final   Sputum evaluation   Final    THIS SPECIMEN IS ACCEPTABLE FOR SPUTUM CULTURE Performed at [facility], [place] [address] [place], [place] [address]    Report Status 04/21/2021 FINAL  Final  Culture, Respiratory w Gram Stain     Status: None   Collection Time: 04/20/21  2:31 PM   Specimen: SPU  Result Value Ref Range Status   Specimen Description SPUTUM  Final   Special Requests EXPECTORATED Reflexed from [id number]  Final   Gram Stain   Final    ABUNDANT WBC PRESENT,BOTH PMN AND MONONUCLEAR MODERATE GRAM POSITIVE COCCI FEW GRAM NEGATIVE RODS Performed at [facility],  [place] [address], [place], [place] [address]    Culture MODERATE PSEUDOMONAS AERUGINOSA  Final   Report Status 04/23/2021 FINAL  Final   Organism ID, Bacteria PSEUDOMONAS AERUGINOSA  Final      Susceptibility   Pseudomonas aeruginosa - MIC*    CEFTAZIDIME 4 SENSITIVE Sensitive     CIPROFLOXACIN 0.5 SENSITIVE Sensitive     GENTAMICIN <=1 SENSITIVE Sensitive     IMIPENEM 2 SENSITIVE Sensitive     PIP/TAZO 8 SENSITIVE Sensitive     CEFEPIME 2 SENSITIVE Sensitive     * MODERATE PSEUDOMONAS AERUGINOSA     Discharge Instructions:   Discharge Instructions     (HEART FAILURE PATIENTS) Call MD:  Anytime you have any of the following symptoms: 1) 3 pound weight gain in 24 hours or 5 pounds in 1 week 2) shortness of breath, with or without a dry hacking cough 3) swelling in the hands, feet or stomach 4) if you have to sleep on extra pillows at night in order to breathe.   Complete by: As directed    Diet - low sodium heart healthy   Complete by: As directed    Diet Carb Modified   Complete by: As directed    Increase activity slowly   Complete by: As directed    No wound care   Complete by: As directed       Allergies as of 04/23/2021       Reactions   Neomycin-bacitracin Zn-polymyx Rash   Sulfonamide Derivatives Other (See Comments)   Stomach cramps   Ciprofloxacin Hives, Itching, Rash   Spironolactone Rash        Medication List     STOP taking these medications    metoprolol succinate 25 MG 24 hr tablet Commonly known as: TOPROL-XL       TAKE these medications    acetaminophen 500 MG tablet Commonly known as: TYLENOL Take 500 mg by mouth every 6 (six) hours as needed for moderate pain or headache.   albuterol 108 (90 Base)  MCG/ACT inhaler Commonly known as: VENTOLIN HFA Inhale 2 puffs into the lungs every 4 (four) hours as needed for wheezing or shortness of breath.   amiodarone 200 MG tablet Commonly known as: PACERONE Take 200 mg by mouth daily.   apixaban 5  MG Tabs tablet Commonly known as: Eliquis Take 1 tablet (5 mg total) by mouth 2 (two) times daily.   Entresto 49-51 MG Generic drug: sacubitril-valsartan Take 1 tablet by mouth 2 (two) times daily.   eplerenone 25 MG tablet Commonly known as: INSPRA TAKE 1 TABLET (25 MG TOTAL) BY MOUTH DAILY.   Farxiga 10 MG Tabs tablet Generic drug: dapagliflozin propanediol TAKE 1 TABLET BY MOUTH DAILY BEFORE BREAKFAST. What changed:  how much to take when to take this   fenofibrate 145 MG tablet Commonly known as: TRICOR TAKE 1 TABLET BY MOUTH EVERY DAY   furosemide 40 MG tablet Commonly known as: LASIX Take 2 tablets (80 mg total) by mouth 2 (two) times daily.   HumaLOG Mix 75/25 KwikPen (75-25) 100 UNIT/ML Kwikpen Generic drug: Insulin Lispro Prot & Lispro Inject 30-40 Units into the skin See admin instructions. Take 40 units in the AM, then take 30 units in the afternoon per patient   icosapent Ethyl 1 g capsule Commonly known as: VASCEPA TAKE 2 CAPSULES BY MOUTH TWICE A DAY   levofloxacin 500 MG tablet Commonly known as: Levaquin Take 1 tablet (500 mg total) by mouth daily for 10 days.   Livalo 4 MG Tabs Generic drug: Pitavastatin Calcium Take 1 tablet by mouth daily.   multivitamin with minerals Tabs tablet Take 1 tablet by mouth daily.   potassium chloride 10 MEQ tablet Commonly known as: Klor-Con M10 Take 2 tablets (20 mEq total) by mouth 2 (two) times daily.               Durable Medical Equipment  (From admission, onward)           Start     Ordered   04/23/21 1229  For home use only DME oxygen  Once       Question Answer Comment  Length of Need 12 Months   Mode or (Route) Nasal cannula   Liters per Minute 2   Frequency Continuous (stationary and portable oxygen unit needed)   Oxygen conserving device Yes   Oxygen delivery system Gas      04/23/21 1228   04/19/21 1057  For home use only DME Walker rolling  Once       Question Answer Comment   Walker: With [place]   Patient needs a walker to treat with the following condition Weakness      04/19/21 [place], [facility] Follow up.   Why: FOr medical equipment ( rolling walker) and oxygen Contact information: [address] [place] [address] [name], Yvonne R, DO Follow up in 1 week(s).   Specialty: Family Medicine Why: bmp Contact information: [place] [address] [place] [address] [phone]         Constance Haw, MD .   Specialty: Cardiology Contact information: [place]  [address] [phone]                  Time coordinating discharge: 35 min  Signed:  Janett Billow  U Brittaney Beaulieu DO  [facility] 04/23/2021, 3:00 PM

## 2021-04-23 NOTE — TOC Transition Note (Addendum)
Transition of Care [facility]) - CM/SW Discharge Note   Patient Details  Name: Heidi Stark MRN: [id number] Date of Birth: 05/03/1941  [facility]) CM/SW Contact:  Verdell Carmine, RN Phone Number: 04/23/2021, 12:31 PM   Clinical Narrative:    Called and spoke to RN, patient ready for DC, [facility] called they are aware she needs oxygen for transport at 2L. She has oxygen at home already set up. Verified that daughter is at the home and  the address of record is correct    Final next level of care: Home/Self Care Barriers to Discharge: Continued Medical Work up   Patient Goals and CMS Choice Patient states their goals for this hospitalization and ongoing recovery are:: to go home      Discharge Placement             Home with oxygen ( set up last admit)          Discharge Plan and Services   Discharge Planning Services: CM Consult Post Acute Care Choice: Durable Medical Equipment          DME Arranged: Walker rolling DME Agency: [facility] Date DME Agency Contacted: 04/19/21 Time DME Agency Contacted: [phone] Representative spoke with at DME Agency: Freda Munro [place] Arranged: NA          Social Determinants of Health ([place]) Interventions     Readmission Risk Interventions No flowsheet data found.

## 2021-04-23 NOTE — Plan of Care (Signed)
  Problem: Education: Goal: Knowledge of General Education information will improve Description: Including pain rating scale, medication(s)/side effects and non-pharmacologic comfort measures Outcome: Adequate for Discharge   Problem: Health Behavior/Discharge Planning: Goal: Ability to manage health-related needs will improve Outcome: Adequate for Discharge   Problem: Clinical Measurements: Goal: Ability to maintain clinical measurements within normal limits will improve Outcome: Adequate for Discharge Goal: Will remain free from infection Outcome: Adequate for Discharge Goal: Diagnostic test results will improve Outcome: Adequate for Discharge Goal: Respiratory complications will improve Outcome: Adequate for Discharge Goal: Cardiovascular complication will be avoided Outcome: Adequate for Discharge   Problem: Activity: Goal: Risk for activity intolerance will decrease Outcome: Adequate for Discharge   Problem: Nutrition: Goal: Adequate nutrition will be maintained Outcome: Adequate for Discharge   Problem: Coping: Goal: Level of anxiety will decrease Outcome: Adequate for Discharge   Problem: Elimination: Goal: Will not experience complications related to bowel motility Outcome: Adequate for Discharge Goal: Will not experience complications related to urinary retention Outcome: Adequate for Discharge   Problem: Pain Managment: Goal: General experience of comfort will improve Outcome: Adequate for Discharge   Problem: Safety: Goal: Ability to remain free from injury will improve Outcome: Adequate for Discharge   Problem: Skin Integrity: Goal: Risk for impaired skin integrity will decrease Outcome: Adequate for Discharge   Problem: Education: Goal: Knowledge of risk factors and measures for prevention of condition will improve Outcome: Adequate for Discharge   Problem: Coping: Goal: Psychosocial and spiritual needs will be supported Outcome: Adequate for  Discharge   Problem: Respiratory: Goal: Will maintain a patent airway Outcome: Adequate for Discharge Goal: Complications related to the disease process, condition or treatment will be avoided or minimized Outcome: Adequate for Discharge   Problem: Education: Goal: Knowledge of disease or condition will improve Outcome: Adequate for Discharge Goal: Knowledge of the prescribed therapeutic regimen will improve Outcome: Adequate for Discharge Goal: Individualized Educational Video(s) Outcome: Adequate for Discharge   Problem: Activity: Goal: Ability to tolerate increased activity will improve Outcome: Adequate for Discharge Goal: Will verbalize the importance of balancing activity with adequate rest periods Outcome: Adequate for Discharge   Problem: Respiratory: Goal: Ability to maintain a clear airway will improve Outcome: Adequate for Discharge Goal: Levels of oxygenation will improve Outcome: Adequate for Discharge Goal: Ability to maintain adequate ventilation will improve Outcome: Adequate for Discharge

## 2021-04-25 ENCOUNTER — Telehealth: Payer: Self-pay

## 2021-04-25 NOTE — Telephone Encounter (Signed)
Transition Care Management Follow-up Telephone Call Date of discharge and from where: 04/23/2021[facility] How have you been since you were released from the hospital? Doing fine Any questions or concerns? No  Items Reviewed: Did the pt receive and understand the discharge instructions provided? Yes  Medications obtained and verified? Yes  Other? Yes  Any new allergies since your discharge? No  Dietary orders reviewed? Yes Do you have support at home? Yes   Home Care and Equipment/Supplies: Were home health services ordered? no If so, what is the name of the agency? N/a  Has the agency set up a time to come to the patient's home? not applicable Were any new equipment or medical supplies ordered?  Yes: oxygen & rolling walker What is the name of the medical supply agency? [employer] Were you able to get the supplies/equipment? Yes but patient states she was given the walker at the hospital & she already had the oxygen at home. Do you have any questions related to the use of the equipment or supplies? N/a  Functional Questionnaire: (I = Independent and D = Dependent) ADLs: I  Bathing/Dressing- I  Meal Prep- I  Eating- I  Maintaining continence- I  Transferring/Ambulation- I  Managing Meds- I  Follow up appointments reviewed:  PCP Hospital f/u appt confirmed? No  Patient refused to make an appt. States she will call back later to schedule once she talks to her daughter [place] Hospital f/u appt confirmed? No  Cardiology to schedule Are transportation arrangements needed? No  If their condition worsens, is the pt aware to call PCP or go to the Emergency Dept.? Yes Was the patient provided with contact information for the PCP's office or ED? Yes Was to pt encouraged to call back with questions or concerns? Yes

## 2021-04-27 ENCOUNTER — Encounter (HOSPITAL_COMMUNITY): Payer: Self-pay

## 2021-04-29 ENCOUNTER — Other Ambulatory Visit (HOSPITAL_COMMUNITY): Payer: Self-pay | Admitting: *Deleted

## 2021-04-29 MED ORDER — CYCLOBENZAPRINE HCL 5 MG PO TABS: 5.0000 mg | ORAL_TABLET | Freq: Three times a day (TID) | ORAL | 0 refills | Status: DC | PRN

## 2021-05-03 ENCOUNTER — Other Ambulatory Visit: Payer: Self-pay

## 2021-05-03 ENCOUNTER — Encounter: Payer: Self-pay | Admitting: Family Medicine

## 2021-05-03 ENCOUNTER — Telehealth (INDEPENDENT_AMBULATORY_CARE_PROVIDER_SITE_OTHER): Payer: Medicare Other | Admitting: Family Medicine

## 2021-05-03 DIAGNOSIS — U071 COVID-19: Secondary | ICD-10-CM

## 2021-05-03 DIAGNOSIS — M545 Low back pain, unspecified: Secondary | ICD-10-CM | POA: Diagnosis not present

## 2021-05-03 DIAGNOSIS — G8929 Other chronic pain: Secondary | ICD-10-CM | POA: Diagnosis not present

## 2021-05-03 DIAGNOSIS — J1282 Pneumonia due to coronavirus disease 2019: Secondary | ICD-10-CM | POA: Diagnosis not present

## 2021-05-03 MED ORDER — CYCLOBENZAPRINE HCL 10 MG PO TABS: 10.0000 mg | ORAL_TABLET | Freq: Three times a day (TID) | ORAL | 0 refills | Status: DC | PRN

## 2021-05-03 NOTE — Progress Notes (Signed)
MyChart Video Visit    Virtual Visit via Video Note   This visit type was conducted due to national recommendations for restrictions regarding the COVID-19 Pandemic (e.g. social distancing) in an effort to limit this patient's exposure and mitigate transmission in our community. This patient is at least at moderate risk for complications without adequate follow up. This format is felt to be most appropriate for this patient at this time. Physical exam was limited by quality of the video and audio technology used for the visit. [name] was able to get the patient set up on a video visit.  Patient location: Home Patient and provider in visit Provider location: Office  I discussed the limitations of evaluation and management by telemedicine and the availability of in person appointments. The patient expressed understanding and agreed to proceed.  Visit Date: 05/03/2021  Today's healthcare provider: Ann Held, DO     Subjective:    Patient ID: Heidi Stark, female    DOB: [birth year], 80 y.o.   MRN: [id number]  No chief complaint on file.   HPI Patient is in today for a video visit.  She complains of low back pain since she was released from the hospital on 04/15/2021. She has a history of back pain and reports she stopped seeing a provider to manage it since her last back surgery. Her pain is limiting her mobility and she cannot leave the house at this time. She is using a walker to assist with mobility. She has consulted her cardiologist about what pain medications she can take safely and was given 5 mg cyclobenzaprine daily PO. She reports she takes 1 tablet 3x daily and includes ibuprofen as well when her pain worsens. She denies having any pain radiating down her legs. She is interested in physical therapy to manage her symptoms.  She was admitted to the hospital on 04/15/2021 for SOB and Covid-19. Her O2 levels were decreased and she was diagnosed with  pneumonia.   She was d/c with 3 days of levaquin for a total of 10 days and she is feeling better    Past Medical History:  Diagnosis Date   A-fib [facility])    Arthritis    "hands, wrists" (11/07/2016)   CHF (congestive heart failure) (HCC)    Chronic lower back pain    Constipation    COPD (chronic obstructive pulmonary disease) ([place])    DVT (deep venous thrombosis) ([place])    Dyspnea    History of blood transfusion 1990   "related to OR"   History of kidney stones    Hyperlipidemia    Hypertension    Joint pain    Lower extremity edema    OSA on CPAP    Persistent atrial fibrillation ([place])    Pneumonia    "several times" (11/07/2016)   Type II diabetes mellitus ([place])     Past Surgical History:  Procedure Laterality Date   ATRIAL FIBRILLATION ABLATION N/A 11/07/2016   Procedure: Atrial Fibrillation Ablation;  Surgeon: [name], MD;  Location: [place] [facility] LAB;  Service: Cardiovascular;  Laterality: N/A;   BACK SURGERY     CARDIAC CATHETERIZATION     CARDIOVERSION N/A 08/11/2016   Procedure: CARDIOVERSION;  Surgeon: Larey Dresser, MD;  Location: [place];  Service: Cardiovascular;  Laterality: N/A;   CARDIOVERSION N/A 12/03/2017   Procedure: CARDIOVERSION;  Surgeon: Larey Dresser, MD;  Location: [facility] ENDOSCOPY;  Service: Cardiovascular;  Laterality: N/A;   CARDIOVERSION  N/A 05/02/2018   Procedure: CARDIOVERSION;  Surgeon: Pixie Casino, MD;  Location: [facility] ENDOSCOPY;  Service: Cardiovascular;  Laterality: N/A;   CARDIOVERSION N/A 06/23/2020   Procedure: CARDIOVERSION;  Surgeon: [name], MD;  Location: [facility] ENDOSCOPY;  Service: Cardiovascular;  Laterality: N/A;   CARDIOVERSION N/A 10/11/2020   Procedure: CARDIOVERSION;  Surgeon: Larey Dresser, MD;  Location: [facility] ENDOSCOPY;  Service: Cardiovascular;  Laterality: N/A;   CARPAL TUNNEL RELEASE     CATARACT EXTRACTION W/ INTRAOCULAR LENS  IMPLANT, BILATERAL Bilateral    COLON SURGERY  1990   vein graft  and colon repair after nicked artery with back surgert   COLONOSCOPY     CORONARY ANGIOGRAPHY N/A 09/08/2020   Procedure: CORONARY ANGIOGRAPHY;  Surgeon: Larey Dresser, MD;  Location: [place] CV LAB;  Service: Cardiovascular;  Laterality: N/A;   CYSTECTOMY     between bladder and kidneys   GANGLION CYST EXCISION Left    ICD IMPLANT N/A 09/15/2020   Procedure: ICD IMPLANT;  Surgeon: Constance Haw, MD;  Location: [place] [facility];  Service: Cardiovascular;  Laterality: N/A;   KNEE CARTILAGE SURGERY Right 1960s   LUMBAR [place] ILIAC ARTERY  1990   vein graft and colon repair after nicked artery with back surgert   TEE WITHOUT CARDIOVERSION N/A 10/31/2013   Procedure: TRANSESOPHAGEAL ECHOCARDIOGRAM (TEE);  Surgeon: Larey Dresser, MD;  Location: [facility] ENDOSCOPY;  Service: Cardiovascular;  Laterality: N/A;   TUBAL LIGATION      Family History  Problem Relation Age of Onset   Diabetes Mother    Hypertension Mother    Cancer Mother    Obesity Mother    Diabetes Father    Obesity Father    Diabetes Other    Melanoma Other    Factor V Leiden deficiency Daughter     Social History   Socioeconomic History   Marital status: Married    Spouse name: Charles   Number of children: 2   Years of education: Not on file   Highest education level: Not on file  Occupational History   Occupation: Retired  Tobacco Use   Smoking status: Former    Packs/day: 1.00    Years: 50.00    Pack years: 50.00    Types: Cigarettes   Smokeless tobacco: Never  Vaping Use   Vaping Use: Never used  Substance and Sexual Activity   Alcohol use: No    Alcohol/week: 0.0 standard drinks   Drug use: No   Sexual activity: Not on file  Other Topics Concern   Not on file  Social History Narrative   Not on file   Social Determinants of Health   Financial Resource Strain: Low Risk    Difficulty of Paying Living Expenses: Not hard at all  Food Insecurity: No Food Insecurity    Worried About Charity fundraiser in the Last Year: Never true   [place] in the Last Year: Never true  Transportation Needs: No Transportation Needs   Lack of Transportation (Medical): No   Lack of Transportation (Non-Medical): No  Physical Activity: Inactive   Days of Exercise per Week: 0 days   Minutes of Exercise per Session: 0 min  Stress: Not on file  Social Connections: Not on file  Intimate Partner Violence: Not on file    Outpatient Medications Prior to Visit  Medication Sig Dispense Refill   acetaminophen (TYLENOL) 500 MG tablet Take 500 mg  by mouth every 6 (six) hours as needed for moderate pain or headache.     albuterol (VENTOLIN HFA) 108 (90 Base) MCG/ACT inhaler Inhale 2 puffs into the lungs every 4 (four) hours as needed for wheezing or shortness of breath. 8 g 0   amiodarone (PACERONE) 200 MG tablet Take 200 mg by mouth daily.     apixaban (ELIQUIS) 5 MG TABS tablet Take 1 tablet (5 mg total) by mouth 2 (two) times daily. 60 tablet 11   cyclobenzaprine (FLEXERIL) 5 MG tablet Take 1 tablet (5 mg total) by mouth 3 (three) times daily as needed (for back pain). 30 tablet 0   eplerenone (INSPRA) 25 MG tablet TAKE 1 TABLET (25 MG TOTAL) BY MOUTH DAILY. 90 tablet 1   FARXIGA 10 MG TABS tablet TAKE 1 TABLET BY MOUTH DAILY BEFORE BREAKFAST. (Patient taking differently: Take 5 mg by mouth daily.) 30 tablet 3   fenofibrate (TRICOR) 145 MG tablet TAKE 1 TABLET BY MOUTH EVERY DAY (Patient taking differently: Take 145 mg by mouth daily.) 90 tablet 3   furosemide (LASIX) 40 MG tablet Take 2 tablets (80 mg total) by mouth 2 (two) times daily. 270 tablet 3   HUMALOG MIX 75/25 KWIKPEN (75-25) 100 UNIT/ML KwikPen Inject 30-40 Units into the skin See admin instructions. Take 40 units in the AM, then take 30 units in the afternoon per patient     icosapent Ethyl (VASCEPA) 1 g capsule TAKE 2 CAPSULES BY MOUTH TWICE A DAY (Patient taking differently: Take 2 g by mouth 2 (two) times  daily.) 120 capsule 5   levofloxacin (LEVAQUIN) 500 MG tablet Take 1 tablet (500 mg total) by mouth daily for 10 days. 3 tablet 0   LIVALO 4 MG TABS Take 1 tablet by mouth daily.     Multiple Vitamin (MULTIVITAMIN WITH MINERALS) TABS tablet Take 1 tablet by mouth daily.     potassium chloride (KLOR-CON M10) 10 MEQ tablet Take 2 tablets (20 mEq total) by mouth 2 (two) times daily. 120 tablet 11   sacubitril-valsartan (ENTRESTO) 49-51 MG Take 1 tablet by mouth 2 (two) times daily. 60 tablet 3   No facility-administered medications prior to visit.    Allergies  Allergen Reactions   Neomycin-Bacitracin Zn-Polymyx Rash   Sulfonamide Derivatives Other (See Comments)    Stomach cramps   Ciprofloxacin Hives, Itching and Rash    Pt doesn't remember having any reaction to cipro   Spironolactone Rash    Review of Systems  Constitutional:  Negative for chills, fever and malaise/fatigue.  HENT:  Negative for congestion and hearing loss.   Eyes:  Negative for discharge.  Respiratory:  Negative for cough, sputum production and shortness of breath.   Cardiovascular:  Negative for chest pain, palpitations and leg swelling.  Gastrointestinal:  Negative for abdominal pain, blood in stool, constipation, diarrhea, heartburn, nausea and vomiting.  Genitourinary:  Negative for dysuria, frequency, hematuria and urgency.  Musculoskeletal:  Positive for back pain (Low back pain). Negative for falls and myalgias.  Skin:  Negative for rash.  Neurological:  Negative for dizziness, sensory change, loss of consciousness, weakness and headaches.  Endo/Heme/Allergies:  Negative for environmental allergies. Does not bruise/bleed easily.  Psychiatric/Behavioral:  Negative for depression and suicidal ideas. The patient is not nervous/anxious and does not have insomnia.       Objective:    Physical Exam Vitals and nursing note reviewed.  Constitutional:      General: She is not in acute distress.  Appearance:  Normal appearance. She is well-developed. She is not ill-appearing.  Neck:     Thyroid: No thyromegaly.     Vascular: No carotid bruit or JVD.  Pulmonary:     Effort: Pulmonary effort is normal.  Neurological:     Mental Status: She is alert and oriented to person, place, and time.  Psychiatric:        Mood and Affect: Mood normal.        Behavior: Behavior normal.        Thought Content: Thought content normal.        Judgment: Judgment normal.    There were no vitals taken for this visit. Wt Readings from Last 3 Encounters:  04/16/21 264 lb 15.9 oz (120.2 kg)  01/27/21 265 lb 9.6 oz (120.5 kg)  12/20/20 263 lb (119.3 kg)    Diabetic Foot Exam - Simple   No data filed    Lab Results  Component Value Date   WBC 9.0 04/23/2021   HGB 14.1 04/23/2021   HCT 42.9 04/23/2021   PLT 240 04/23/2021   GLUCOSE 75 04/23/2021   CHOL 186 01/27/2021   TRIG 292 (H) 01/27/2021   HDL 34 (L) 01/27/2021   LDLDIRECT 42.0 11/30/2014   LDLCALC 94 01/27/2021   ALT 23 04/23/2021   AST 32 04/23/2021   NA 136 04/23/2021   K 3.8 04/23/2021   CL 98 04/23/2021   CREATININE 1.04 (H) 04/23/2021   BUN 25 (H) 04/23/2021   CO2 30 04/23/2021   TSH 1.188 04/18/2021   INR 1.3 (H) 09/13/2020   HGBA1C 9.0 (H) 04/15/2021   MICROALBUR 12.8 10/26/2020    Lab Results  Component Value Date   TSH 1.188 04/18/2021   Lab Results  Component Value Date   WBC 9.0 04/23/2021   HGB 14.1 04/23/2021   HCT 42.9 04/23/2021   MCV 94.1 04/23/2021   PLT 240 04/23/2021   Lab Results  Component Value Date   NA 136 04/23/2021   K 3.8 04/23/2021   CO2 30 04/23/2021   GLUCOSE 75 04/23/2021   BUN 25 (H) 04/23/2021   CREATININE 1.04 (H) 04/23/2021   BILITOT 0.6 04/23/2021   ALKPHOS 45 04/23/2021   AST 32 04/23/2021   ALT 23 04/23/2021   PROT 6.1 (L) 04/23/2021   ALBUMIN 2.5 (L) 04/23/2021   CALCIUM 8.2 (L) 04/23/2021   ANIONGAP 8 04/23/2021   GFR 112.55 10/11/2015   Lab Results  Component Value Date    CHOL 186 01/27/2021   Lab Results  Component Value Date   HDL 34 (L) 01/27/2021   Lab Results  Component Value Date   LDLCALC 94 01/27/2021   Lab Results  Component Value Date   TRIG 292 (H) 01/27/2021   Lab Results  Component Value Date   CHOLHDL 5.5 01/27/2021   Lab Results  Component Value Date   HGBA1C 9.0 (H) 04/15/2021       Assessment & Plan:   Problem List Items Addressed This Visit   None    No orders of the defined types were placed in this encounter.   I discussed the assessment and treatment plan with the patient. The patient was provided an opportunity to ask questions and all were answered. The patient agreed with the plan and demonstrated an understanding of the instructions.   The patient was advised to call back or seek an in-person evaluation if the symptoms worsen or if the condition fails to improve as anticipated.  I,Shehryar  Baig,acting as a Education administrator for [employer], DO.,have documented all relevant documentation on the behalf of Ann Held, DO,as directed by  Ann Held, DO while in the presence of Ann Held, DO.  I provided 20 minutes of face-to-face time during this encounter.   Ann Held, DO [place] [facility] [employer] [phone] (phone) [phone] (fax)  [place]

## 2021-05-05 ENCOUNTER — Other Ambulatory Visit: Payer: Self-pay | Admitting: Family Medicine

## 2021-05-05 ENCOUNTER — Encounter: Payer: Self-pay | Admitting: Family Medicine

## 2021-05-05 ENCOUNTER — Telehealth: Payer: Self-pay | Admitting: *Deleted

## 2021-05-05 DIAGNOSIS — M545 Low back pain, unspecified: Secondary | ICD-10-CM

## 2021-05-05 MED ORDER — CYCLOBENZAPRINE HCL 10 MG PO TABS: 10.0000 mg | ORAL_TABLET | Freq: Three times a day (TID) | ORAL | 0 refills | Status: DC | PRN

## 2021-05-05 NOTE — Chronic Care Management (AMB) (Signed)
  Chronic Care Management   Note  05/05/2021 Name: Heidi Stark MRN: [id number] DOB: December 31, 1940  Heidi Stark is a 80 y.o. year old female who is a primary care patient of Ann Held, DO. I reached out to [name] by phone today in response to a referral sent by Ms. Connye Burkitt Reininger's PCP.  Ms. Gradillas was given information about Chronic Care Management services today including:  CCM service includes personalized support from designated clinical staff supervised by her physician, including individualized plan of care and coordination with other care providers 24/7 contact phone numbers for assistance for urgent and routine care needs. Service will only be billed when office clinical staff spend 20 minutes or more in a month to coordinate care. Only one practitioner may furnish and bill the service in a calendar month. The patient may stop CCM services at any time (effective at the end of the month) by phone call to the office staff. The patient is responsible for co-pay (up to 20% after annual deductible is met) if co-pay is required by the individual health plan.   Patient agreed to services and verbal consent obtained.   Follow up plan: Telephone appointment with care management team member scheduled for: 05/11/2021 and 05/12/2021  Julian Hy, [facility]  Direct Dial: [phone]

## 2021-05-08 ENCOUNTER — Other Ambulatory Visit: Payer: Self-pay | Admitting: Family Medicine

## 2021-05-08 MED ORDER — LEVOFLOXACIN 500 MG PO TABS: 500.0000 mg | ORAL_TABLET | Freq: Every day | ORAL | 0 refills | Status: AC

## 2021-05-09 ENCOUNTER — Telehealth: Payer: Self-pay | Admitting: Family Medicine

## 2021-05-09 NOTE — Telephone Encounter (Signed)
Caller/Agency: Elby Beck Number: [phone] Requesting OT/PT/Skilled Nursing/Social Work/Speech Therapy: PT Frequency: 1 w 1, 2 w 3, 1 w 4.

## 2021-05-10 NOTE — Telephone Encounter (Signed)
Spoke w/ Lidji- verbal orders given.

## 2021-05-10 NOTE — Telephone Encounter (Signed)
Heidi Stark called regarding verbal orders. Advised her message was sent yesterday, and someone will reach out at their earliest convenience.

## 2021-05-11 ENCOUNTER — Ambulatory Visit (INDEPENDENT_AMBULATORY_CARE_PROVIDER_SITE_OTHER): Payer: Medicare Other | Admitting: Pharmacist

## 2021-05-11 DIAGNOSIS — E1165 Type 2 diabetes mellitus with hyperglycemia: Secondary | ICD-10-CM

## 2021-05-11 DIAGNOSIS — I10 Essential (primary) hypertension: Secondary | ICD-10-CM

## 2021-05-11 DIAGNOSIS — I480 Paroxysmal atrial fibrillation: Secondary | ICD-10-CM

## 2021-05-11 DIAGNOSIS — Z794 Long term (current) use of insulin: Secondary | ICD-10-CM

## 2021-05-11 DIAGNOSIS — E785 Hyperlipidemia, unspecified: Secondary | ICD-10-CM

## 2021-05-11 DIAGNOSIS — I5020 Unspecified systolic (congestive) heart failure: Secondary | ICD-10-CM

## 2021-05-12 ENCOUNTER — Ambulatory Visit: Payer: Medicare Other

## 2021-05-12 ENCOUNTER — Telehealth: Payer: Medicare Other

## 2021-05-12 DIAGNOSIS — Z794 Long term (current) use of insulin: Secondary | ICD-10-CM

## 2021-05-12 DIAGNOSIS — J42 Unspecified chronic bronchitis: Secondary | ICD-10-CM

## 2021-05-12 DIAGNOSIS — G8929 Other chronic pain: Secondary | ICD-10-CM

## 2021-05-12 DIAGNOSIS — E1165 Type 2 diabetes mellitus with hyperglycemia: Secondary | ICD-10-CM

## 2021-05-12 DIAGNOSIS — M545 Low back pain, unspecified: Secondary | ICD-10-CM

## 2021-05-12 NOTE — Patient Instructions (Addendum)
Visit Information: Thank you for speaking with me today. Below you will find goals discussed.  Consent to CCM Services: Ms. Foulk was given information about Chronic Care Management services including:  CCM service includes personalized support from designated clinical staff supervised by her physician, including individualized plan of care and coordination with other care providers 24/7 contact phone numbers for assistance for urgent and routine care needs. Service will only be billed when office clinical staff spend 20 minutes or more in a month to coordinate care. Only one practitioner may furnish and bill the service in a calendar month. The patient may stop CCM services at any time (effective at the end of the month) by phone call to the office staff. The patient will be responsible for cost sharing (co-pay) of up to 20% of the service fee (after annual deductible is met).  Patient agreed to services and verbal consent obtained.   The patient verbalized understanding of instructions, educational materials, and care plan provided today and agreed to receive a mailed copy of patient instructions, educational materials, and care plan.   Telephone follow up appointment with care management team member scheduled for: The patient has been provided with contact information for the care management team and has been advised to call with any health related questions or concerns.   Thea Silversmith, RN, MSN, BSN, CCM Care Management Coordinator [facility] [phone]   CLINICAL CARE PLAN: Patient Care Plan: RN Care Manager Plan of Care     Problem Identified: No plan of Care Established for Management of Chronic Disease (DM, COPD, CHF, Chronic midline low back Pain)   Priority: High     Long-Range Goal: Development of Plan of Care for Chronic Disease Managment (DM, COPD, CHF, Chronic midline low back Pain)   Start Date: 05/12/2021  Expected End Date: 11/10/2021  Priority:  High  Note:   Current Barriers: recent admission 04/15/21 - 04/23/21 with COVID/PNA/acute on chronic CHF in a patient with history of COPD. DM- Hemoglobin A1C noted to be 9 on 04/15/21. Patient states blood sugar today was 176 AC. Patient also reports increased in Chronic low back Pain since hospitalization. She reports this is her primary concern, as this is greatly limiting her mobility. Currently active with home health physical therapy, using heating pad and muscle relaxant. Active with Embedded clinical pharmacist regarding medications and assist with disease management of chronic conditions.  Chronic Disease Management support and education needs related to CHF, COPD, DMII, and chronic low back pain  RNCM Clinical Goal(s):  Patient will verbalize understanding of plan for management of CHF, COPD, DMII, and chronic low back pain take all medications exactly as prescribed and will call provider for medication related questions will demonstrate ongoing self health care management ability .  through collaboration with Consulting civil engineer, provider, and care team.   Interventions: 1:1 collaboration with primary care provider regarding development and update of comprehensive plan of care as evidenced by provider attestation and co-signature Inter-disciplinary care team collaboration (see longitudinal plan of care) Evaluation of current treatment plan related to  self management and patient's adherence to plan as established by provider  Pain Interventions: Pain assessment performed Medications reviewed and encouraged to take medications as prescribed. Reviewed provider established plan for pain management; Discussed importance of adherence to all scheduled medical appointments; Reviewed with patient prescribed pharmacological and nonpharmacological pain relief strategies; Encouraged patient to work with home health physical therapist as recommended.  Hypertension Interventions: Last practice  recorded BP readings:  BP Readings from Last 3 Encounters:  04/23/21 (!) 105/54  01/27/21 118/76  12/20/20 126/64  Most recent eGFR/CrCl: No results found for: EGFR  No components found for: CRCL  Diabetes Interventions: Assessed patient's understanding of A1c goal: <7% Reviewed medications with patient and discussed importance of medication adherence; Discussed plans with patient for ongoing care management follow up and provided patient with direct contact information for care management team; Reviewed scheduled/upcoming provider appointments including:  ; Reviewed Hemoglobin A1C result with patient Encouraged to eat healthy-low sodium, carb modified. Lab Results  Component Value Date   HGBA1C 9.0 (H) 04/15/2021   COPD Interventions:   Medications reviewed with patient and encouraged to take as prescribed.  Heart Failure Interventions: Provided education on low sodium diet; Assessed need for readable accurate scales in home; Discussed importance of daily weight and advised patient to weigh and record daily;  Patient Goals/Self-Care Activities: Patient will self administer medications as prescribed Patient will attend all scheduled provider appointments Patient will call provider office for new concerns or questions Work with home health physical therapist as recommended Review education provided re: low back pain, COPD and Eating When you have Diabetes. Plan to discuss at next telephone call. Check your blood sugars as recommended by your provider. Plan to eat healthy: : eat plenty of vegetables, fruits, lean meats; monitor your fat intake; limit: sugars, rice, potatoes, pasta. Monitor salt intake.

## 2021-05-12 NOTE — Chronic Care Management (AMB) (Signed)
Chronic Care Management   CCM RN Visit Note  05/12/2021 Name: Heidi Stark MRN: [id number] DOB: 1940/12/10  Subjective: Heidi Stark is a 80 y.o. year old female who is a primary care patient of Ann Held, DO. The care management team was consulted for assistance with disease management and care coordination needs.    Engaged with patient by telephone for initial visit in response to provider referral for case management and/or care coordination services.   Consent to Services:  The patient was given the following information about Chronic Care Management services today, agreed to services, and gave verbal consent: 1. CCM service includes personalized support from designated clinical staff supervised by the primary care provider, including individualized plan of care and coordination with other care providers 2. 24/7 contact phone numbers for assistance for urgent and routine care needs. 3. Service will only be billed when office clinical staff spend 20 minutes or more in a month to coordinate care. 4. Only one practitioner may furnish and bill the service in a calendar month. 5.The patient may stop CCM services at any time (effective at the end of the month) by phone call to the office staff. 6. The patient will be responsible for cost sharing (co-pay) of up to 20% of the service fee (after annual deductible is met). Patient agreed to services and consent obtained.  Patient agreed to services and verbal consent obtained.   Assessment: Review of patient past medical history, allergies, medications, health status, including review of consultants reports, laboratory and other test data, was performed as part of comprehensive evaluation and provision of chronic care management services.   SDOH (Social Determinants of Health) assessments and interventions performed:  SDOH Interventions    Flowsheet Row Most Recent Value  SDOH Interventions   Food Insecurity Interventions  Intervention Not Indicated  Transportation Interventions Intervention Not Indicated        CCM Care Plan  Allergies  Allergen Reactions   Neomycin-Bacitracin Zn-Polymyx Rash   Sulfonamide Derivatives Other (See Comments)    Stomach cramps   Ciprofloxacin Hives, Itching and Rash    Pt doesn't remember having any reaction to cipro   Spironolactone Rash    Outpatient Encounter Medications as of 05/12/2021  Medication Sig   acetaminophen (TYLENOL) 500 MG tablet Take 500 mg by mouth every 6 (six) hours as needed for moderate pain or headache.   albuterol (VENTOLIN HFA) 108 (90 Base) MCG/ACT inhaler Inhale 2 puffs into the lungs every 4 (four) hours as needed for wheezing or shortness of breath.   amiodarone (PACERONE) 200 MG tablet Take 200 mg by mouth daily.   apixaban (ELIQUIS) 5 MG TABS tablet Take 1 tablet (5 mg total) by mouth 2 (two) times daily.   cyclobenzaprine (FLEXERIL) 10 MG tablet Take 1 tablet (10 mg total) by mouth 3 (three) times daily as needed for muscle spasms.   eplerenone (INSPRA) 25 MG tablet TAKE 1 TABLET (25 MG TOTAL) BY MOUTH DAILY.   FARXIGA 10 MG TABS tablet TAKE 1 TABLET BY MOUTH DAILY BEFORE BREAKFAST.   fenofibrate (TRICOR) 145 MG tablet TAKE 1 TABLET BY MOUTH EVERY DAY   furosemide (LASIX) 40 MG tablet Take 2 tablets (80 mg total) by mouth 2 (two) times daily.   HUMALOG MIX 75/25 KWIKPEN (75-25) 100 UNIT/ML KwikPen Inject 30-40 Units into the skin See admin instructions. Take 40 units in the AM, then take 30 units in the afternoon per patient   icosapent Ethyl (VASCEPA) 1 g  capsule TAKE 2 CAPSULES BY MOUTH TWICE A DAY   levofloxacin (LEVAQUIN) 500 MG tablet Take 1 tablet (500 mg total) by mouth daily for 7 days.   LIVALO 4 MG TABS Take 1 tablet by mouth daily.   Multiple Vitamin (MULTIVITAMIN WITH MINERALS) TABS tablet Take 1 tablet by mouth daily.   potassium chloride (KLOR-CON M10) 10 MEQ tablet Take 2 tablets (20 mEq total) by mouth 2 (two) times daily.    sacubitril-valsartan (ENTRESTO) 49-51 MG Take 1 tablet by mouth 2 (two) times daily.   No facility-administered encounter medications on file as of 05/12/2021.    Patient Active Problem List   Diagnosis Date Noted   COVID-19 04/15/2021   Respiratory failure with hypoxia ([place]) 92/05/9416   Systolic CHF ([address]) 40/81/4481   CKD (chronic kidney disease) stage 3, GFR 30-59 ml/min (HCC) 04/15/2021   Elevated troponin 04/15/2021   Acute on chronic systolic (congestive) heart failure ([place]) 09/14/2020   Wide-complex tachycardia 09/07/2020   Ventricular tachyarrhythmia 09/07/2020   VT (ventricular tachycardia) 09/07/2020   Persistent atrial fibrillation ([place]) 04/30/2018   Wound of left leg 10/01/2017   Cellulitis of right lower extremity 11/27/2016   Encounter for assessment for deep vein thrombosis (DVT) 11/14/2016   Hypokalemia 08/13/2016   Constipation 08/13/2016   Type 2 diabetes mellitus with hyperglycemia, with long-term current use of insulin (HCC)    Atherosclerosis of aorta ([place]) 06/26/2016   Tobacco abuse 06/26/2016   Colitis 11/24/2014   Severe obesity (BMI >= 40) ([place]) 11/12/2013   PAF (paroxysmal atrial fibrillation) ([place]) 11/03/2013   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 10/28/2013   COLONIC POLYPS 08/16/2010   PAD (peripheral artery disease) ([place]) 08/15/2010   HLD (hyperlipidemia) 11/18/2009   MYALGIA 11/18/2009   OBESITY 05/14/2007   PULMONARY NODULE, RIGHT MIDDLE LOBE 05/14/2007   Obstructive sleep apnea 12/06/2006   Essential hypertension 12/06/2006   COPD (chronic obstructive pulmonary disease) ([place]) 12/06/2006    Conditions to be addressed/monitored:CHF, COPD, DMII, and chronic low back pain  Care Plan : RN Care Manager Plan of Care  Updates made by Luretha Rued, RN since 05/13/2021 12:00 AM     Problem: No plan of Care Established for Management of Chronic Disease (DM, COPD, CHF, Chronic midline low back Pain)    Priority: High     Long-Range Goal: Development of Plan of Care for Chronic Disease Managment (DM, COPD, CHF, Chronic midline low back Pain)   Start Date: 05/12/2021  Expected End Date: 11/10/2021  Priority: High  Note:   Current Barriers: recent admission 04/15/21 - 04/23/21 with COVID/PNA/acute on chronic CHF in a patient with history of COPD. DM- Hemoglobin A1C noted to be 9 on 04/15/21. Patient states blood sugar today was 176 AC. Patient also reports increased in Chronic low back Pain since hospitalization. She reports this is her primary concern, as this is greatly limiting her mobility. Currently active with home health physical therapy, using heating pad and muscle relaxant. Active with Embedded clinical pharmacist regarding medications and assist with disease management of chronic conditions.  Chronic Disease Management support and education needs related to CHF, COPD, DMII, and chronic low back pain  RNCM Clinical Goal(s):  Patient will verbalize understanding of plan for management of CHF, COPD, DMII, and chronic low back pain take all medications exactly as prescribed and will call provider for medication related questions will demonstrate ongoing self health care management ability .  through collaboration with Consulting civil engineer, provider,  and care team.   Interventions: 1:1 collaboration with primary care provider regarding development and update of comprehensive plan of care as evidenced by provider attestation and co-signature Inter-disciplinary care team collaboration (see longitudinal plan of care) Evaluation of current treatment plan related to  self management and patient's adherence to plan as established by provider  Pain Interventions: Pain assessment performed Medications reviewed and encouraged to take medications as prescribed. Reviewed provider established plan for pain management; Discussed importance of adherence to all scheduled medical appointments; Reviewed with  patient prescribed pharmacological and nonpharmacological pain relief strategies; Encouraged patient to work with home health physical therapist as recommended.  Hypertension Interventions: Last practice recorded BP readings:  BP Readings from Last 3 Encounters:  04/23/21 (!) 105/54  01/27/21 118/76  12/20/20 126/64  Most recent eGFR/CrCl: No results found for: EGFR  No components found for: CRCL  Diabetes Interventions: Assessed patient's understanding of A1c goal: <7% Reviewed medications with patient and discussed importance of medication adherence; Discussed plans with patient for ongoing care management follow up and provided patient with direct contact information for care management team; Reviewed scheduled/upcoming provider appointments including:  ; Reviewed Hemoglobin A1C result with patient Encouraged to eat healthy-low sodium, carb modified. Lab Results  Component Value Date   HGBA1C 9.0 (H) 04/15/2021   COPD Interventions:   Medications reviewed with patient and encouraged to take as prescribed.  Heart Failure Interventions: Provided education on low sodium diet; Assessed need for readable accurate scales in home; Discussed importance of daily weight and advised patient to weigh and record daily;  Patient Goals/Self-Care Activities: Patient will self administer medications as prescribed Patient will attend all scheduled provider appointments Patient will call provider office for new concerns or questions Work with home health physical therapist as recommended Review education provided re: low back pain, COPD and Eating When you have Diabetes. Plan to discuss at next telephone call. Check your blood sugars as recommended by your provider. Plan to eat healthy: : eat plenty of vegetables, fruits, lean meats; monitor your fat intake; limit: sugars, rice, potatoes, pasta. Monitor salt intake.    Plan:Telephone follow up appointment with care management team member  scheduled for:  next month The patient has been provided with contact information for the care management team and has been advised to call with any health related questions or concerns.   Thea Silversmith, RN, MSN, BSN, CCM Care Management Coordinator [facility] [phone]

## 2021-05-16 DIAGNOSIS — J42 Unspecified chronic bronchitis: Secondary | ICD-10-CM

## 2021-05-16 DIAGNOSIS — I480 Paroxysmal atrial fibrillation: Secondary | ICD-10-CM

## 2021-05-16 DIAGNOSIS — E1165 Type 2 diabetes mellitus with hyperglycemia: Secondary | ICD-10-CM

## 2021-05-16 DIAGNOSIS — Z794 Long term (current) use of insulin: Secondary | ICD-10-CM

## 2021-05-16 DIAGNOSIS — I5020 Unspecified systolic (congestive) heart failure: Secondary | ICD-10-CM

## 2021-05-16 DIAGNOSIS — E785 Hyperlipidemia, unspecified: Secondary | ICD-10-CM

## 2021-05-16 DIAGNOSIS — I10 Essential (primary) hypertension: Secondary | ICD-10-CM | POA: Diagnosis not present

## 2021-05-16 NOTE — Patient Instructions (Signed)
Visit Information  PATIENT GOALS:  Goals Addressed             This [facility] for Chronic Care Management       Current Barriers:  Unable to independently afford treatment regimen Unable to achieve control of type 2 diabetes  Unable to maintain control of hypertension and hyperlipidemia    Diabetes: Uncontrolled; Initial A1c goal <8.0% - will try to get <7.5% if able without increase in hypoglycemia  Lab Results  Component Value Date   HGBA1C 9.0 (H) 04/15/2021   Managed by endocrinologist - Dr [name] Current treatment: Humalog 75/25 Mix - inject 40 units each morning and 30 units each evening Farxiga 10mg  daily before breakfast Interventions:  Reviewed home blood glucose readings and reviewed goals  Fasting blood glucose goal (before meals) = 80 to 130 Blood glucose goal after a meal = less than 180  Recommended she check some blood glucose readings prior to evening meal - will help to determine effectiveness of insulin regimen Provided patient assistance program applications for [place] (Humalog) and [place] for [name] - she is out of coverage gap for 2022 but entered in March 2022. Patient assistance program applications will be for 2023.   Hypertension / CHF: BP Readings from Last 3 Encounters:  04/23/21 (!) 105/54  01/27/21 118/76  12/20/20 126/64   Current treatment: [name] 10mg  daily  Entresto 49-51mg  - take 1 tablet twice a day Inspra / Eplerenone 25mg  once a day Furosemide 40mg  - take 2 tablets = 80mg  twice a day Potassium 10 mEq - take 2 tablets twice a day  Interventions:  Reviewed blood pressure and medications Screened for patient assistance. Not needed for 2022 but reached coverage gap by March 2022. Will provide medication assistance program applications for [place] for 2023.  Continue to follow up with [facility]  Hyperlipidemia: Uncontrolled; LDL goal <70  Lab Results  Component Value Date    CHOL 186 01/27/2021   HDL 34 (L) 01/27/2021   LDLCALC 94 01/27/2021   LDLDIRECT 42.0 11/30/2014   TRIG 292 (H) 01/27/2021   CHOLHDL 5.5 01/27/2021   Current treatment: Livalo 4mg  daily  Fenofibrate 145mg  daily Vascepa / icosapent ethyl 1 gram - take 2 capsules twice a day  Interventions:  Discussed LDL goals Provided medication assistance program application for 9562 for Livalo Continue current medications for lipid lowering. Recommend checking lipids with next labs.  Dicussed limiting saturated and trans fat in diet.   Atrial Fibrillation: current rate/rhythm control Amiodarone 200mg  daily (started 08/2020) anticoagulant treatment: Eliquis 5mg  twice a day Intervention:  Reminded patient to monitor for signs and symptoms of bleeding Monitor liver function and thyroid function and get annual eye exam while taking amiodarone Will continue to follow in 2023 and provide medication assistance program application for Eliquis (cannot apply until reaches coverage gap)  Medication management Pharmacist Clinical Goal(s): Over the next 90 days, patient will work with PharmD and providers to maintain optimal medication adherence Current pharmacy: [facility] Comprehensive medication review performed. Reviewed refill history and assessed adherence Continue current medication management strategy Patient was not aware the antibiotic levofloxacin has been called into her pharmacy 2 days ago. Made her aware and she will pick up today.  Patient Goals/Self-Care Activities Over the next 180 days, patient will:  take medications as prescribed, check blood pressure 3 to 4 times per week, document, and provide at future appointments, weigh daily, and contact provider  if weight gain of more than 3 lbs in 24 hours or 5 lbs in 1 week, and collaborate with provider on medication access solutions  Follow Up Plan: Telephone follow up appointment with care management team member scheduled for:  1 to  2 months to check medication assistance program process for 2023 and home blood glucose readings.         Patient verbalizes understanding of instructions provided today and agrees to view in [place].   Telephone follow up appointment with care management team member scheduled for: 1 to 2 months  Heidi Stark, PharmD Clinical Pharmacist [facility]

## 2021-05-16 NOTE — Chronic Care Management (AMB) (Signed)
Chronic Care Management Pharmacy Note  05/16/2021 Name:  Heidi Stark MRN:  [id number] DOB:  Jul 26, 1940  Summary: Heidi Stark coverage gap in March 2022 and Out of gap and into catastrophic coverage June 2022. Current cost of medications OK but spent a lot on medications earlier in 2022.  Blood glucose not optimally controlled.  LDL not at goal  Recommendations/Changes made from today's visit: Sent applications for medication assistance program for 2023 for Livalo, Entresto and Humalog Mix. Will try to avoid / prolong time to reach coverage gap if possible for 2023.  Will continue to follow in 2023 and send Eliquis medication assistance program as needed since cannot apply until she hits coverage gap.  Recommended she check blood glucose ar varying times of day - especially get a few reading prior to evening meal. This will help to determine if Humalog Mix dose in morning or evening needs to be adjusted to prevent highs prior to bedtime  Subjective: Heidi Stark is an 80 y.o. year old female who is a primary patient of Ann Held, DO.  The CCM team was consulted for assistance with disease management and care coordination needs.    Engaged with patient by telephone for initial visit in response to provider referral for pharmacy case management and/or care coordination services.   Consent to Services:  The patient was given the following information about Chronic Care Management services today, agreed to services, and gave verbal consent: 1. CCM service includes personalized support from designated clinical staff supervised by the primary care provider, including individualized plan of care and coordination with other care providers 2. 24/7 contact phone numbers for assistance for urgent and routine care needs. 3. Service will only be billed when office clinical staff spend 20 minutes or more in a month to coordinate care. 4. Only one practitioner may furnish and bill the service  in a calendar month. 5.The patient may stop CCM services at any time (effective at the end of the month) by phone call to the office staff. 6. The patient will be responsible for cost sharing (co-pay) of up to 20% of the service fee (after annual deductible is met). Patient agreed to services and consent obtained.  Patient Care Team: [name], [name], DO as PCP - General Constance Haw, MD as PCP - Electrophysiology (Cardiology) Luberta Mutter, MD as Consulting Physician (Ophthalmology) Jacelyn Pi, MD as Consulting Physician (Endocrinology) Luretha Rued, RN as Case Manager [name], PharmD (Pharmacist)  Recent office visits: 05/05/2021 - PCP - phone call. Requested antibiotic. Prescribed for Levaquin 538m for 7 days  05/03/2021 - PCP (Dr LEtter Sjogren Video Visit for back pain. Increased cyclobenzaprine 150mup to 3 times a day as needed.   Recent consult visits: 01/27/2021 - Cardio (Dr [facility]ra [facility]inCHF clinic. No med changes. Rechecked TSH - WNL.  01/24/2021 - Endo (Dr BaChalmers Caterno notes available 12/20/2020 - Cardio (Dr CaCurt BearsSeen for atrial fibrillation.  noted mildly elevated TSH. No supplementation start yet.  12/09/2020 - Cardio (Dr McAundra DubinCHF clinic. Added Farxiga 1063maily.   Hospital visits: Medication Reconciliation was completed by comparing discharge summary, patient's EMR and Pharmacy list, and upon discussion with patient. 04/15/2021 to 04/23/2021 [facility] Received Remdesivir. beta blocker stopped due to bradycardia.  New Medications at discharge: levofloxacin 500m68mily Medications Stopped at discharge: metoprolol succinate 25mg63mications Changed at Discharge: none   Objective:  Lab Results  Component Value  Date   CREATININE 1.04 (H) 04/23/2021   CREATININE 1.17 (H) 04/22/2021   CREATININE 1.10 (H) 04/21/2021    Lab Results  Component Value Date   HGBA1C 9.0 (H) 04/15/2021   Last  diabetic Eye exam:  Lab Results  Component Value Date/Time   HMDIABEYEEXA No Retinopathy 08/10/2015 12:00 AM    Last diabetic Foot exam: No results found for: HMDIABFOOTEX      Component Value Date/Time   CHOL 186 01/27/2021 1146   CHOL 170 06/02/2019 0913   TRIG 292 (H) 01/27/2021 1146   HDL 34 (L) 01/27/2021 1146   HDL 31 (L) 06/02/2019 0913   CHOLHDL 5.5 01/27/2021 1146   VLDL 58 (H) 01/27/2021 1146   LDLCALC 94 01/27/2021 1146   LDLCALC 92 06/02/2019 0913   LDLCALC 77 06/28/2018 1410   LDLDIRECT 42.0 11/30/2014 1226    Hepatic Function Latest Ref Rng & Units 04/23/2021 04/22/2021 04/21/2021  Total Protein 6.5 - 8.1 g/dL 6.1(L) 6.0(L) 6.1(L)  Albumin 3.5 - 5.0 g/dL 2.5(L) 2.6(L) 2.6(L)  AST 15 - 41 U/L 32 32 32  ALT 0 - 44 U/L 23 26 25   Alk Phosphatase 38 - 126 U/L 45 46 47  Total Bilirubin 0.3 - 1.2 mg/dL 0.6 0.7 0.6  Bilirubin, Direct 0.0 - 0.2 mg/dL - - -    Lab Results  Component Value Date/Time   TSH 1.188 04/18/2021 12:59 AM   TSH 4.435 01/27/2021 11:46 AM   TSH 5.510 (H) 12/20/2020 02:17 PM   TSH 1.740 09/09/2018 01:26 PM   FREET4 1.51 12/20/2020 02:17 PM   FREET4 1.02 12/09/2020 02:43 PM    CBC Latest Ref Rng & Units 04/23/2021 04/22/2021 04/21/2021  WBC 4.0 - 10.5 K/uL 9.0 8.7 10.1  Hemoglobin 12.0 - 15.0 g/dL 14.1 14.6 14.4  Hematocrit 36.0 - 46.0 % 42.9 45.1 45.1  Platelets 150 - 400 K/uL 240 179 213    Lab Results  Component Value Date/Time   VD25OH 4.3 (L) 09/09/2018 01:26 PM   VD25OH 22 (L) 11/18/2009 08:27 PM    Clinical ASCVD: Yes  The 10-year ASCVD risk score (Arnett DK, et al., 2019) is: 35.9%   Values used to calculate the score:     Age: 80 years     Sex: Female     Is Non-Hispanic African American: No     Diabetic: Yes     Tobacco smoker: No     Systolic Blood Pressure: 314 mmHg     Is BP treated: Yes     HDL Cholesterol: 34 mg/dL     Total Cholesterol: 186 mg/dL    Other: CHADS2VASc = 6  Social History   Tobacco Use  Smoking  Status Former   Packs/day: 1.00   Years: 50.00   Pack years: 50.00   Types: Cigarettes  Smokeless Tobacco Never   BP Readings from Last 3 Encounters:  04/23/21 (!) 105/54  01/27/21 118/76  12/20/20 126/64   Pulse Readings from Last 3 Encounters:  04/23/21 (!) 52  01/27/21 60  12/20/20 64   Wt Readings from Last 3 Encounters:  04/16/21 264 lb 15.9 oz (120.2 kg)  01/27/21 265 lb 9.6 oz (120.5 kg)  12/20/20 263 lb (119.3 kg)    Assessment: Review of patient past medical history, allergies, medications, health status, including review of consultants reports, laboratory and other test data, was performed as part of comprehensive evaluation and provision of chronic care management services.   SDOH:  (Social Determinants of Health) assessments and  interventions performed:    CCM Care Plan  Allergies  Allergen Reactions   Neomycin-Bacitracin Zn-Polymyx Rash   Sulfonamide Derivatives Other (See Comments)    Stomach cramps   Ciprofloxacin Hives, Itching and Rash    Pt doesn't remember having any reaction to cipro   Spironolactone Rash    Medications Reviewed Today     Reviewed by Luretha Rued, RN (Registered Nurse) on 05/12/21 at 1539  Med List Status: <None>   Medication Order Taking? Sig Documenting Provider Last Dose Status Informant  acetaminophen (TYLENOL) 500 MG tablet [id number] Yes Take 500 mg by mouth every 6 (six) hours as needed for moderate pain or headache. [provider] Taking Active Self  albuterol (VENTOLIN HFA) 108 (90 Base) MCG/ACT inhaler [id number] Yes Inhale 2 puffs into the lungs every 4 (four) hours as needed for wheezing or shortness of breath. Geradine Girt, DO Taking Active   amiodarone (PACERONE) 200 MG tablet [id number] Yes Take 200 mg by mouth daily. [provider] Taking Active Self  apixaban (ELIQUIS) 5 MG TABS tablet [id number] Yes Take 1 tablet (5 mg total) by mouth 2 (two) times daily. Lyda Jester M, PA-C Taking  Active Self  cyclobenzaprine (FLEXERIL) 10 MG tablet [id number] Yes Take 1 tablet (10 mg total) by mouth 3 (three) times daily as needed for muscle spasms. Roma Schanz R, DO Taking Active   eplerenone (INSPRA) 25 MG tablet [id number] Yes TAKE 1 TABLET (25 MG TOTAL) BY MOUTH DAILY. Larey Dresser, MD Taking Active Self  FARXIGA 10 MG TABS tablet [id number] Yes TAKE 1 TABLET BY MOUTH DAILY BEFORE BREAKFAST. Larey Dresser, MD Taking Active   fenofibrate (TRICOR) 145 MG tablet [id number] Yes TAKE 1 TABLET BY MOUTH EVERY DAY Larey Dresser, MD Taking Active   furosemide (LASIX) 40 MG tablet [id number] Yes Take 2 tablets (80 mg total) by mouth 2 (two) times daily. Larey Dresser, MD Taking Active Self  HUMALOG MIX 75/25 KWIKPEN (75-25) 100 UNIT/ML KwikPen [id number] Yes Inject 30-40 Units into the skin See admin instructions. Take 40 units in the AM, then take 30 units in the afternoon per patient [provider] Taking Active Self  icosapent Ethyl (VASCEPA) 1 g capsule [id number] Yes TAKE 2 CAPSULES BY MOUTH TWICE A DAY Larey Dresser, MD Taking Active   levofloxacin (LEVAQUIN) 500 MG tablet [id number] No Take 1 tablet (500 mg total) by mouth daily for 7 days.  Patient not taking: No sig reported   Ann Held, DO Not Taking Active   LIVALO 4 MG TABS [id number] Yes Take 1 tablet by mouth daily. [provider] Taking Active Self  Multiple Vitamin (MULTIVITAMIN WITH MINERALS) TABS tablet [id number] Yes Take 1 tablet by mouth daily. [provider] Taking Active Self  potassium chloride (KLOR-CON M10) 10 MEQ tablet [id number] Yes Take 2 tablets (20 mEq total) by mouth 2 (two) times daily. Lyda Jester M, PA-C Taking Active Self  sacubitril-valsartan (ENTRESTO) 49-51 MG [id number] Yes Take 1 tablet by mouth 2 (two) times daily. Larey Dresser, MD Taking Active Self            Patient Active Problem List   Diagnosis Date Noted   COVID-19 04/15/2021    Respiratory failure with hypoxia ([place]) 55/20/8022   Systolic CHF ([place]) 33/61/2244   CKD (chronic kidney disease) stage 3, GFR 30-59 ml/min (HCC) 04/15/2021   Elevated troponin 04/15/2021   Acute on chronic systolic (congestive) heart failure ([place]) 09/14/2020  Wide-complex tachycardia 09/07/2020   Ventricular tachyarrhythmia 09/07/2020   VT (ventricular tachycardia) 09/07/2020   Persistent atrial fibrillation ([place]) 04/30/2018   Wound of left leg 10/01/2017   Cellulitis of right lower extremity 11/27/2016   Encounter for assessment for deep vein thrombosis (DVT) 11/14/2016   Hypokalemia 08/13/2016   Constipation 08/13/2016   Type 2 diabetes mellitus with hyperglycemia, with long-term current use of insulin (HCC)    Atherosclerosis of aorta ([place]) 06/26/2016   Tobacco abuse 06/26/2016   Colitis 11/24/2014   Severe obesity (BMI >= 40) (HCC) 11/12/2013   PAF (paroxysmal atrial fibrillation) ([place]) 11/03/2013   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 10/28/2013   COLONIC POLYPS 08/16/2010   PAD (peripheral artery disease) ([place]) 08/15/2010   HLD (hyperlipidemia) 11/18/2009   MYALGIA 11/18/2009   OBESITY 05/14/2007   PULMONARY NODULE, RIGHT MIDDLE LOBE 05/14/2007   Obstructive sleep apnea 12/06/2006   Essential hypertension 12/06/2006   COPD (chronic obstructive pulmonary disease) ([place]) 12/06/2006    Immunization History  Administered Date(s) Administered   Influenza Whole 04/28/2009, 04/27/2010   Influenza, High Dose Seasonal PF 04/24/2014   Influenza-Unspecified 04/17/2015   Pneumococcal Conjugate-13 10/11/2015   Pneumococcal Polysaccharide-23 07/17/2002, 11/29/2011   Td 07/17/2002, 06/28/2018   Zoster, Live 11/11/2008    Conditions to be addressed/monitored: Atrial Fibrillation, CHF, CAD, HTN, HLD, Hypertriglyceridemia, COPD, and DMII  Care Plan : General Pharmacy (Adult)  Updates made by [name], PHARMD since 05/16/2021 12:00 AM      Problem: HTN; CHF; atrial fibrillation, Hyperlipidemia; type 2 DM, insulin dependent;   Priority: High  Onset Date: 05/11/2021     Long-Range Goal: Provide education, support and care coordination for medication therapy and chronic conditions   Start Date: 05/11/2021  Expected End Date: 05/11/2022  Priority: High  Note:   Current Barriers:  Unable to independently afford treatment regimen Unable to achieve control of type 2 diabetes  Unable to maintain control of hypertension and hyperlipidemia   Pharmacist Clinical Goal(s):  Over the next 180 days, patient will verbalize ability to afford treatment regimen achieve control of type 2 diabetes as evidenced by A1c <8.0% (to start, will adjust goal as needed) maintain control of hypertension and hyperlipidemia  as evidenced by blood pressure <140/90 and LDL <70  through collaboration with PharmD and provider.   Interventions: 1:1 collaboration with [name], [name], DO regarding development and update of comprehensive plan of care as evidenced by provider attestation and co-signature Inter-disciplinary care team collaboration (see longitudinal plan of care) Comprehensive medication review performed; medication list updated in electronic medical record  Diabetes: Uncontrolled; Initial A1c goal <8.0% - will try to gest <7.5% if able without increase in hypoglycemia Managed by endocrinologist - Dr [name] Current treatment: Humalog 75/25 Mix - inject 40 units each morning and 30 units each evening Farxiga 61m daily before breakfast Past treatments: Invokamet; Lantus Current glucose readings: Fasting AM blood glucose usually 150 to 170's Evening blood glucose prior to bedtime usually > 200 Denies hypoglycemic/hyperglycemic symptoms Interventions:  Reviewed home blood glucose readings and reviewed goals  Fasting blood glucose goal (before meals) = 80 to 130 Blood glucose goal after a meal = less than 180  Recommended she check  some blood glucose readings prior to evening meal - will help to determine effectiveness of insulin regimen Provided patient assistance program applications for [place] (Humalog) and [place] and [place] for [place]- she is out of coverage gap for 2022 but entered in March  2022. Patient assistance program applications will be for 2023.   Hypertension / CHF: Controlled CHF  managed by Dr [name] with [facility] Current treatment: Farxiga 48m daily  Entresto 49-522m- take 1 tablet twice a day Inspra / Eplerenone 2568mnce a day Furosemide 13m23mtake 2 tablets = 80mg13mce a day Potassium 10 mEq - take 2 tablets twice a day  Previous medications: metoprolol succinate 25mg 4motension) Current home readings: Patient could not recall exact numbers and did not have record but states blood pressure never >140/90 and since beta blocker (metoprolol) was stopped no blood pressure <100/60) Lats BNP = 106 (12/09/2020) EF = 55% (12/2017) Interventions:  Reviewed blood pressure and medications Screened for patient assistance. Not needed for 2022 but reached coverage gap by March 2022. Will provide medication assistance program applications for [id number].  Continue to follow up with [facility]  Hyperlipidemia: Uncontrolled; LDL goal <70 Current treatment: Livalo 4mg da59m  Fenofibrate 145mg da93mVascepa / icosapent ethyl 1 gram - take 2 capsules twice a day Medications previously tried: pravastatin and rosuvastatin  Interventions:  Discussed LDL goals Provided medication assistance program application for 2023 [id number] Continue current medications for lipid lowering. Recommend checking lipids with next labs.  Dicussed limiting saturated and trans fat in diet.   Atrial Fibrillation: Controlled;  current rate/rhythm control Amiodarone 200mg dai37mstarted 08/2020) anticoagulant treatment: Eliquis 5mg twice44mday CHADS2VASc score: 6 Intervention:   Reminded patient to monitor for signs and symptoms of bleeding Monitor LFTs and thyroid function and get annual eye exam while taking amiodarone Will continue to follow in 2023 and provide medication assistance program application for Eliquis (cannot apply until reaches coverage gap) Medication management Pharmacist Clinical Goal(s): Over the next 90 days, patient will work with PharmD and providers to maintain optimal medication adherence Current pharmacy: [facility] Comprehensive medication review performed. Reviewed refill history and assessed adherence Continue current medication management strategy Patient was not aware the antibiotic levofloxacin has been called into her pharmacy 2 days ago. Made her aware and she will pick up today.  Patient Goals/Self-Care Activities Over the next 180 days, patient will:  take medications as prescribed, check blood pressure 3 to 4 times per week, document, and provide at future appointments, weigh daily, and contact provider if weight gain of more than 3 lbs in 24 hours or 5 lbs in 1 week, and collaborate with provider on medication access solutions  Follow Up Plan: Telephone follow up appointment with care management team member scheduled for:  1 to 2 months to check medication assistance program process for 2023.         Medication Assistance:  None needed for 2022 but she reached coverage gap March 2022 and was out by June 2022. Spent a lot of money in 2022 on medications. Mailed application for 2023 to [id number] avoid coverage  gap / decrease medication costs for 2023.   Patient's preferred pharmacy is:  [id number] [id number] [id number] [id number]:[phone]:  Patient agrees to Care Plan and  Follow-up.  Plan: Telephone follow up appointment with care management team member scheduled for:  1 to 2 months  Navi Erber EckardCherre Robinsnical Pharmacist [place]

## 2021-05-20 ENCOUNTER — Other Ambulatory Visit: Payer: Self-pay | Admitting: Family Medicine

## 2021-05-20 DIAGNOSIS — G8929 Other chronic pain: Secondary | ICD-10-CM

## 2021-05-24 ENCOUNTER — Ambulatory Visit (INDEPENDENT_AMBULATORY_CARE_PROVIDER_SITE_OTHER): Payer: Medicare Other | Admitting: Pharmacist

## 2021-05-24 DIAGNOSIS — E785 Hyperlipidemia, unspecified: Secondary | ICD-10-CM

## 2021-05-24 DIAGNOSIS — E1165 Type 2 diabetes mellitus with hyperglycemia: Secondary | ICD-10-CM

## 2021-05-28 NOTE — Chronic Care Management (AMB) (Signed)
Chronic Care Management Pharmacy Note  05/28/2021 Name:  Heidi Stark MRN:  [id number] DOB:  1941/04/01  Summary: Heidi Stark coverage gap in March 2022. Current she is out of gap and into catastrophic coverage as of June 2022. Current cost of medications OK but spent a lot on medications earlier in 2022.  Blood glucose not optimally controlled.  LDL not at goal  Recommendations/Changes made from today's visit: Sent applications for medication assistance program for 2023 for Livalo, Entresto and Humalog Mix. Will try to avoid or prolong time to reach coverage gap if possible for 2023.  Will continue to follow in 2023 and send Eliquis medication assistance program as needed since cannot apply until she hits coverage gap.  Follow up with Dr [name] regarding high blood glucose readings in evening.   Subjective: Heidi Stark is an 80 y.o. year old female who is a primary patient of Ann Held, DO.  The CCM team was consulted for assistance with disease management and care coordination needs.    Engaged with patient by telephone for follow up visit in response to provider referral for pharmacy case management and/or care coordination services.   Consent to Services:  The patient was given information about Chronic Care Management services, agreed to services, and gave verbal consent prior to initiation of services.  Please see initial visit note for detailed documentation.   Patient Care Team: [name], [name], DO as PCP - General Constance Haw, MD as PCP - Electrophysiology (Cardiology) Luberta Mutter, MD as Consulting Physician (Ophthalmology) Jacelyn Pi, MD as Consulting Physician (Endocrinology) Luretha Rued, RN as Case Manager [name], RPH-CPP (Pharmacist)  Recent office visits: 05/05/2021 - PCP - phone call. Requested antibiotic. Prescribed for Levaquin 53m for 7 days  05/03/2021 - PCP (Dr LEtter Sjogren Video Visit for back pain. Increased  cyclobenzaprine 131mup to 3 times a day as needed.   Recent consult visits: 01/27/2021 - Cardio (Dr [facility]inCHF clinic. No med changes. Rechecked TSH - WNL.  01/24/2021 - Endo (Dr BaChalmers Caterno notes available 12/20/2020 - Cardio (Dr CaCurt BearsSeen for atrial fibrillation.  noted mildly elevated TSH. No supplementation start yet.  12/09/2020 - Cardio (Dr McAundra DubinCHF clinic. Added Farxiga 108maily.   [facility]: [facility] Received Remdesivir. beta blocker stopped due to bradycardia.  New Medications at discharge: levofloxacin 500m46mily Medications Stopped at discharge: metoprolol succinate 25mg74mications Changed at Discharge: none   Objective:  Lab Results  Component Value Date   CREATININE 1.04 (H) 04/23/2021   CREATININE 1.17 (H) 04/22/2021   CREATININE 1.10 (H) 04/21/2021    Lab Results  Component Value Date   HGBA1C 9.0 (H) 04/15/2021   Last diabetic Eye exam:  Lab Results  Component Value Date/Time   HMDIABEYEEXA No Retinopathy 08/10/2015 12:00 AM    Last diabetic Foot exam: No results found for: HMDIABFOOTEX      Component Value Date/Time   CHOL 186 01/27/2021 1146   CHOL 170 06/02/2019 0913   TRIG 292 (H) 01/27/2021 1146   HDL 34 (L) 01/27/2021 1146   HDL 31 (L) 06/02/2019 0913   CHOLHDL 5.5 01/27/2021 1146   VLDL 58 (H) 01/27/2021 1146   LDLCALC 94 01/27/2021 1146   LDLCALC 92 06/02/2019 0913   LDLCALC 77 06/28/2018 1410   LDLDIRECT 42.0 11/30/2014 1226    Hepatic Function Latest Ref Rng & Units 04/23/2021 04/22/2021 04/21/2021  Total Protein 6.5 - 8.1 g/dL 6.1(L) 6.0(L) 6.1(L)  Albumin 3.5 - 5.0 g/dL 2.5(L) 2.6(L) 2.6(L)  AST 15 - 41 U/L 32 32 32  ALT 0 - 44 U/L 23 26 25   Alk Phosphatase 38 - 126 U/L 45 46 47  Total Bilirubin 0.3 - 1.2 mg/dL 0.6 0.7 0.6  Bilirubin, Direct 0.0 - 0.2 mg/dL - - -    Lab Results  Component Value Date/Time   TSH 1.188 04/18/2021 12:59  AM   TSH 4.435 01/27/2021 11:46 AM   TSH 5.510 (H) 12/20/2020 02:17 PM   TSH 1.740 09/09/2018 01:26 PM   FREET4 1.51 12/20/2020 02:17 PM   FREET4 1.02 12/09/2020 02:43 PM    CBC Latest Ref Rng & Units 04/23/2021 04/22/2021 04/21/2021  WBC 4.0 - 10.5 K/uL 9.0 8.7 10.1  Hemoglobin 12.0 - 15.0 g/dL 14.1 14.6 14.4  Hematocrit 36.0 - 46.0 % 42.9 45.1 45.1  Platelets 150 - 400 K/uL 240 179 213    Lab Results  Component Value Date/Time   VD25OH 4.3 (L) 09/09/2018 01:26 PM   VD25OH 22 (L) 11/18/2009 08:27 PM    Clinical ASCVD: Yes  The 10-year ASCVD risk score (Arnett DK, et al., 2019) is: 35.9%   Values used to calculate the score:     Age: 65 years     Sex: Female     Is Non-Hispanic African American: No     Diabetic: Yes     Tobacco smoker: No     Systolic Blood Pressure: 175 mmHg     Is BP treated: Yes     HDL Cholesterol: 34 mg/dL     Total Cholesterol: 186 mg/dL    Other: CHADS2VASc = 6  Social History   Tobacco Use  Smoking Status Former   Packs/day: 1.00   Years: 50.00   Pack years: 50.00   Types: Cigarettes  Smokeless Tobacco Never   BP Readings from Last 3 Encounters:  04/23/21 (!) 105/54  01/27/21 118/76  12/20/20 126/64   Pulse Readings from Last 3 Encounters:  04/23/21 (!) 52  01/27/21 60  12/20/20 64   Wt Readings from Last 3 Encounters:  04/16/21 264 lb 15.9 oz (120.2 kg)  01/27/21 265 lb 9.6 oz (120.5 kg)  12/20/20 263 lb (119.3 kg)    Assessment: Review of patient past medical history, allergies, medications, health status, including review of consultants reports, laboratory and other test data, was performed as part of comprehensive evaluation and provision of chronic care management services.   SDOH:  (Social Determinants of Health) assessments and interventions performed:    CCM Care Plan  Allergies  Allergen Reactions   Neomycin-Bacitracin Zn-Polymyx Rash   Sulfonamide Derivatives Other (See Comments)    Stomach cramps    Ciprofloxacin Hives, Itching and Rash    Pt doesn't remember having any reaction to cipro   Spironolactone Rash    Medications Reviewed Today     Reviewed by [name], RPH-CPP (Pharmacist) on 05/24/21 at 66  Med List Status: <None>   Medication Order Taking? Sig Documenting Provider Last Dose Status Informant  acetaminophen (TYLENOL) 500 MG tablet [id number] Yes Take 500 mg by mouth every 6 (six) hours as needed for moderate pain or headache. [provider] Taking Active Self  albuterol (VENTOLIN HFA) 108 (90 Base) MCG/ACT inhaler [id number] Yes Inhale 2 puffs into the lungs every 4 (four) hours as needed for wheezing or shortness of breath. Geradine Girt, DO Taking Active   amiodarone (PACERONE) 200  MG tablet [id number] Yes Take 200 mg by mouth daily. [provider] Taking Active Self  apixaban (ELIQUIS) 5 MG TABS tablet [id number] Yes Take 1 tablet (5 mg total) by mouth 2 (two) times daily. Lyda Jester M, PA-C Taking Active Self  cyclobenzaprine (FLEXERIL) 10 MG tablet [id number] Yes TAKE 1 TABLET BY MOUTH THREE TIMES A DAY AS NEEDED FOR MUSCLE SPASMS Ann Held, DO Taking Active   eplerenone (INSPRA) 25 MG tablet [id number] Yes TAKE 1 TABLET (25 MG TOTAL) BY MOUTH DAILY. Larey Dresser, MD Taking Active Self  FARXIGA 10 MG TABS tablet [id number] Yes TAKE 1 TABLET BY MOUTH DAILY BEFORE BREAKFAST. Larey Dresser, MD Taking Active   fenofibrate (TRICOR) 145 MG tablet [id number] Yes TAKE 1 TABLET BY MOUTH EVERY DAY Larey Dresser, MD Taking Active   furosemide (LASIX) 40 MG tablet [id number] Yes Take 2 tablets (80 mg total) by mouth 2 (two) times daily. Larey Dresser, MD Taking Active Self  HUMALOG MIX 75/25 KWIKPEN (75-25) 100 UNIT/ML KwikPen [id number] Yes Inject 30-40 Units into the skin See admin instructions. Take 40 units in the AM, then take 30 units in the afternoon per patient [provider] Taking Active Self  icosapent Ethyl  (VASCEPA) 1 g capsule [id number] Yes TAKE 2 CAPSULES BY MOUTH TWICE A DAY Larey Dresser, MD Taking Active   LIVALO 4 MG TABS [id number] Yes Take 1 tablet by mouth daily. [provider] Taking Active Self  Multiple Vitamin (MULTIVITAMIN WITH MINERALS) TABS tablet [id number] Yes Take 1 tablet by mouth daily. [provider] Taking Active Self  potassium chloride (KLOR-CON M10) 10 MEQ tablet [id number] Yes Take 2 tablets (20 mEq total) by mouth 2 (two) times daily. Lyda Jester M, PA-C Taking Active Self  sacubitril-valsartan (ENTRESTO) 49-51 MG [id number] Yes Take 1 tablet by mouth 2 (two) times daily. Larey Dresser, MD Taking Active Self            Patient Active Problem List   Diagnosis Date Noted   COVID-19 04/15/2021   Respiratory failure with hypoxia ([place]) 09/62/8366   Systolic CHF ([place]) 29/47/6546   CKD (chronic kidney disease) stage 3, GFR 30-59 ml/min ([place]) 04/15/2021   Elevated troponin 04/15/2021   Acute on chronic systolic (congestive) heart failure ([place]) 09/14/2020   Wide-complex tachycardia 09/07/2020   Ventricular tachyarrhythmia 09/07/2020   VT (ventricular tachycardia) 09/07/2020   Persistent atrial fibrillation ([place]) 04/30/2018   Wound of left leg 10/01/2017   Cellulitis of right lower extremity 11/27/2016   Encounter for assessment for deep vein thrombosis (DVT) 11/14/2016   Hypokalemia 08/13/2016   Constipation 08/13/2016   Type 2 diabetes mellitus with hyperglycemia, with long-term current use of insulin (HCC)    Atherosclerosis of aorta ([place]) 06/26/2016   Tobacco abuse 06/26/2016   Colitis 11/24/2014   Severe obesity (BMI >= 40) ([place]) 11/12/2013   PAF (paroxysmal atrial fibrillation) ([place]) 11/03/2013   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 10/28/2013   COLONIC POLYPS 08/16/2010   PAD (peripheral artery disease) ([place]) 08/15/2010   HLD (hyperlipidemia) 11/18/2009   MYALGIA 11/18/2009   OBESITY  05/14/2007   PULMONARY NODULE, RIGHT MIDDLE LOBE 05/14/2007   Obstructive sleep apnea 12/06/2006   Essential hypertension 12/06/2006   COPD (chronic obstructive pulmonary disease) ([place]) 12/06/2006    Immunization History  Administered Date(s) Administered   Influenza Whole 04/28/2009, 04/27/2010   Influenza, High Dose Seasonal PF 04/24/2014   Influenza-Unspecified 04/17/2015  Pneumococcal Conjugate-13 10/11/2015   Pneumococcal Polysaccharide-23 07/17/2002, 11/29/2011   Td 07/17/2002, 06/28/2018   Zoster, Live 11/11/2008    Conditions to be addressed/monitored: Atrial Fibrillation, CHF, CAD, HTN, HLD, Hypertriglyceridemia, COPD, and DMII  Care Plan : General Pharmacy (Adult)  Updates made by [name], RPH-CPP since 05/28/2021 12:00 AM     Problem: HTN; CHF; atrial fibrillation, Hyperlipidemia; type 2 DM, insulin dependent;   Priority: High  Onset Date: 05/11/2021     Long-Range Goal: Provide education, support and care coordination for medication therapy and chronic conditions   Start Date: 05/11/2021  Expected End Date: 05/11/2022  Priority: High  Note:   Current Barriers:  Unable to independently afford treatment regimen Unable to achieve control of type 2 diabetes  Unable to maintain control of hypertension and hyperlipidemia   Pharmacist Clinical Goal(s):  Over the next 180 days, patient will verbalize ability to afford treatment regimen achieve control of type 2 diabetes as evidenced by A1c <8.0% (to start, will adjust goal as needed) maintain control of hypertension and hyperlipidemia  as evidenced by blood pressure <140/90 and LDL <70  through collaboration with PharmD and provider.   Interventions: 1:1 collaboration with [name], [name], DO regarding development and update of comprehensive plan of care as evidenced by provider attestation and co-signature Inter-disciplinary care team collaboration (see longitudinal plan of care) Comprehensive  medication review performed; medication list updated in electronic medical record  Diabetes: Uncontrolled; Initial A1c goal <8.0% - will try to gest <7.5% if able without increase in hypoglycemia Managed by endocrinologist - Dr [name] Current treatment: Humalog 75/25 Mix - inject 40 units each morning and 30 units each evening Farxiga 45m daily before breakfast Past treatments: Invokamet; Lantus Current glucose readings: Date Blood glucose prior to AM meal Blood glucose prior to PM meal  10/26 176 117  10/27 167 157  10/28 234 218  10/29 160 215  10/30 137 207  10/31 185 255  11/1 175 177  11/2 176 112  11/3 154 152  11/4 132 245  11/5 180 185  11/6 173 137  11/7 161 192  Denies hypoglycemic/hyperglycemic symptoms Interventions:  Reviewed home blood glucose readings and reviewed goals  Fasting blood glucose goal (before meals) = 80 to 130 Blood glucose goal after a meal = less than 180  Recommended follow up with Dr BChalmers Cateras planned for adjustment in insulin therapy. (Increase in am dose should help to lower evening highs) Provided patient assistance program applications for Lily (Humalog) and [place] for Farxiga at lat visit. She is out of coverage gap for 2022 but entered in March 2022. Patient assistance program applications will be for 2023.  Reminded patient to complete patient assistance program applications.  Hypertension / CHF: Controlled CHF  managed by Dr MAundra Dubinwith [facility] Current treatment: Farxiga 149mdaily  Entresto 49-5149m take 1 tablet twice a day Inspra / Eplerenone 56m14mce a day Furosemide 40mg35make 2 tablets = 80mg 82me a day Potassium 10 mEq - take 2 tablets twice a day  Previous medications: metoprolol succinate 56mg (28mtension) Current home readings: Patient could not recall exact numbers and did not have record but states blood pressure never >140/90 and since beta blocker (metoprolol) was stopped no blood  pressure <100/60) Lats BNP = 106 (12/09/2020) EF = 55% (12/2017) Interventions:  Reviewed blood pressure and medications Screened for patient assistance. Not needed for 2022 but reached coverage gap by March 2022.  Will provide medication assistance program applications for Horton Chin for 2023. (Patient received recently, plans to complete and return to office)  Continue to follow up with Heart Failure Clinic  Hyperlipidemia: Uncontrolled; LDL goal <70 Current treatment: Livalo 16m daily  Fenofibrate 1412mdaily Vascepa / icosapent ethyl 1 gram - take 2 capsules twice a day Medications previously tried: pravastatin and rosuvastatin  Interventions:  Discussed LDL goals Provided medication assistance program application for [id number] Livalo Continue current medications for lipid lowering. Recommend checking lipids with next labs.  Dicussed limiting saturated and trans fat in diet.   Atrial Fibrillation: Controlled;  current rate/rhythm control Amiodarone 20062maily (started 08/2020) anticoagulant treatment: Eliquis 5mg71mice a day CHADS2VASc score: 6 Intervention:  Reminded patient to monitor for signs and symptoms of bleeding Monitor LFTs and thyroid function and get annual eye exam while taking amiodarone Will continue to follow in 2023 and provide medication assistance program application for Eliquis (cannot apply until reaches coverage gap)  Medication management Pharmacist Clinical Goal(s): Over the next 90 days, patient will work with PharmD and providers to maintain optimal medication adherence Current pharmacy[facility] Comprehensive medication review performed. Reviewed refill history and assessed adherence Continue current medication management strategy  Patient Goals/Self-Care Activities Over the next 180 days, patient will:  take medications as prescribed, check blood pressure 3 to 4 times per week, document, and provide at future appointments,  weigh daily, and contact provider if weight gain of more than 3 lbs in 24 hours or 5 lbs in 1 week, and collaborate with provider on medication access solutions  Follow Up Plan: Telephone follow up appointment with care management team member scheduled for:  1 to 2 months to check medication assistance program process for 2023.         Medication Assistance:  None needed for 2022 but she reached coverage gap March 2022 and was out by June 2022. Spent a lot of money in 2022 on medications. Mailed application for [id number] to avoid coverage  gap / decrease medication costs for 2023.   [id number]: [phone]/pharmacy #[id number] [address]: [phone] Up:  Patient agrees to Care Plan and Follow-up.  Plan: Telephone follow up appointment with care management team member scheduled for:  1 to 2 months  Admire Bunnell Cherre RobinsmD Clinical Pharmacist [facility]

## 2021-05-28 NOTE — Patient Instructions (Signed)
Visit Information  Diabetes: Uncontrolled; Initial A1c goal <8.0% - will try to get <7.5% if able without increase in hypoglycemia  Lab Results  Component Value Date   HGBA1C 9.0 (H) 04/15/2021    Managed by endocrinologist - Dr [name] Current treatment: Humalog 75/25 Mix - inject 40 units each morning and 30 units each evening Farxiga 10mg  daily before breakfast Interventions:  Reviewed home blood glucose readings and reviewed goals  Fasting blood glucose goal (before meals) = 80 to 130 Blood glucose goal after a meal = less than 180  Follow up with Dr [name] for adjustment in insulin therpay Provided patient assistance program applications for Lily (Humalog) and [place] and [place] for [name] - she is out of coverage gap for 2022 but entered in March 2022. Patient assistance program applications will be for 2023.   Hypertension / CHF: BP Readings from Last 3 Encounters:  04/23/21 (!) 105/54  01/27/21 118/76  12/20/20 126/64    Current treatment: [name] 10mg  daily  Entresto 49-51mg  - take 1 tablet twice a day Inspra / Eplerenone 25mg  once a day Furosemide 40mg  - take 2 tablets = 80mg  twice a day Potassium 10 mEq - take 2 tablets twice a day  Interventions:  Reviewed blood pressure and medications Screened for patient assistance. Not needed for 2022 but reached coverage gap by March 2022. Provided medication assistance program applications for Horton Chin for 2023.  Continue to follow up with [facility] Failure Clinic  Hyperlipidemia: Uncontrolled; LDL goal <70  Lab Results  Component Value Date   CHOL 186 01/27/2021   HDL 34 (L) 01/27/2021   LDLCALC 94 01/27/2021   LDLDIRECT 42.0 11/30/2014   TRIG 292 (H) 01/27/2021   CHOLHDL 5.5 01/27/2021    Current treatment: Livalo 4mg  daily  Fenofibrate 145mg  daily Vascepa / icosapent ethyl 1 gram - take 2 capsules twice a day Interventions:  Discussed LDL goals Provided medication assistance program application for [id number] for  Livalo Continue current medications for lipid lowering. Recommend checking lipids with next labs.  Dicussed limiting saturated and trans fat in diet.   Atrial Fibrillation: current rate/rhythm control Amiodarone 200mg  daily (started 08/2020) anticoagulant treatment: Eliquis 5mg  twice a day Intervention:  Reminded patient to monitor for signs and symptoms of bleeding Monitor liver function and thyroid function and get annual eye exam while taking amiodarone Will continue to follow in 2023 and provide medication assistance program application for Eliquis (cannot apply until reaches coverage gap)  Medication management Pharmacist Clinical Goal(s): Over the next 90 days, patient will work with PharmD and providers to maintain optimal medication adherence Current pharmacy: [facility] Comprehensive medication review performed. Reviewed refill history and assessed adherence Continue current medication management strategy Patient was not aware the antibiotic levofloxacin has been called into her pharmacy 2 days ago. Made her aware and she will pick up today.  Patient Goals/Self-Care Activities Over the next 180 days, patient will:  take medications as prescribed, check blood pressure 3 to 4 times per week, document, and provide at future appointments, weigh daily, and contact provider if weight gain of more than 3 lbs in 24 hours or 5 lbs in 1 week, and collaborate with provider on medication access solutions  Follow Up Plan: Telephone follow up appointment with care management team member scheduled for:  1 to 2 months to check medication assistance program process for 2023 and home blood glucose readings.  Patient verbalizes understanding of instructions provided today and agrees to view in [place].  Telephone follow up appointment with care management team member scheduled for: 1 to 2 months  [name], PharmD Clinical Pharmacist [facility]

## 2021-05-30 ENCOUNTER — Encounter (HOSPITAL_COMMUNITY): Payer: Self-pay | Admitting: Cardiology

## 2021-05-30 ENCOUNTER — Ambulatory Visit (HOSPITAL_COMMUNITY)
Admission: RE | Admit: 2021-05-30 | Discharge: 2021-05-30 | Disposition: A | Discharge status: Home / Self Care | Payer: Medicare Other | Source: Ambulatory Visit | Attending: Cardiology | Admitting: Cardiology

## 2021-05-30 VITALS — BP 104/70 | HR 74 | Wt 268.2 lb

## 2021-05-30 DIAGNOSIS — M79605 Pain in left leg: Secondary | ICD-10-CM | POA: Insufficient documentation

## 2021-05-30 DIAGNOSIS — E876 Hypokalemia: Secondary | ICD-10-CM | POA: Diagnosis not present

## 2021-05-30 DIAGNOSIS — I480 Paroxysmal atrial fibrillation: Secondary | ICD-10-CM | POA: Diagnosis not present

## 2021-05-30 DIAGNOSIS — Z87891 Personal history of nicotine dependence: Secondary | ICD-10-CM | POA: Insufficient documentation

## 2021-05-30 DIAGNOSIS — I4729 Other ventricular tachycardia: Secondary | ICD-10-CM | POA: Diagnosis not present

## 2021-05-30 DIAGNOSIS — Z8616 Personal history of COVID-19: Secondary | ICD-10-CM | POA: Insufficient documentation

## 2021-05-30 DIAGNOSIS — I110 Hypertensive heart disease with heart failure: Secondary | ICD-10-CM | POA: Diagnosis not present

## 2021-05-30 DIAGNOSIS — Z79899 Other long term (current) drug therapy: Secondary | ICD-10-CM | POA: Insufficient documentation

## 2021-05-30 DIAGNOSIS — M545 Low back pain, unspecified: Secondary | ICD-10-CM | POA: Diagnosis not present

## 2021-05-30 DIAGNOSIS — E785 Hyperlipidemia, unspecified: Secondary | ICD-10-CM | POA: Diagnosis not present

## 2021-05-30 DIAGNOSIS — Z95810 Presence of automatic (implantable) cardiac defibrillator: Secondary | ICD-10-CM | POA: Diagnosis not present

## 2021-05-30 DIAGNOSIS — E669 Obesity, unspecified: Secondary | ICD-10-CM | POA: Diagnosis not present

## 2021-05-30 DIAGNOSIS — Z7984 Long term (current) use of oral hypoglycemic drugs: Secondary | ICD-10-CM | POA: Diagnosis not present

## 2021-05-30 DIAGNOSIS — I5042 Chronic combined systolic (congestive) and diastolic (congestive) heart failure: Secondary | ICD-10-CM | POA: Diagnosis not present

## 2021-05-30 DIAGNOSIS — G4733 Obstructive sleep apnea (adult) (pediatric): Secondary | ICD-10-CM | POA: Insufficient documentation

## 2021-05-30 DIAGNOSIS — E1151 Type 2 diabetes mellitus with diabetic peripheral angiopathy without gangrene: Secondary | ICD-10-CM | POA: Insufficient documentation

## 2021-05-30 DIAGNOSIS — I2510 Atherosclerotic heart disease of native coronary artery without angina pectoris: Secondary | ICD-10-CM | POA: Diagnosis not present

## 2021-05-30 DIAGNOSIS — I4510 Unspecified right bundle-branch block: Secondary | ICD-10-CM | POA: Diagnosis not present

## 2021-05-30 DIAGNOSIS — Z6841 Body Mass Index (BMI) 40.0 and over, adult: Secondary | ICD-10-CM | POA: Diagnosis not present

## 2021-05-30 DIAGNOSIS — Z7901 Long term (current) use of anticoagulants: Secondary | ICD-10-CM | POA: Diagnosis not present

## 2021-05-30 DIAGNOSIS — Z8249 Family history of ischemic heart disease and other diseases of the circulatory system: Secondary | ICD-10-CM | POA: Insufficient documentation

## 2021-05-30 DIAGNOSIS — I5022 Chronic systolic (congestive) heart failure: Secondary | ICD-10-CM

## 2021-05-30 DIAGNOSIS — J449 Chronic obstructive pulmonary disease, unspecified: Secondary | ICD-10-CM | POA: Diagnosis not present

## 2021-05-30 DIAGNOSIS — I5020 Unspecified systolic (congestive) heart failure: Secondary | ICD-10-CM | POA: Diagnosis not present

## 2021-05-30 LAB — COMPREHENSIVE METABOLIC PANEL
ALT: 26 U/L (ref 0–44)
AST: 39 U/L (ref 15–41)
Albumin: 3.6 g/dL (ref 3.5–5.0)
Alkaline Phosphatase: 62 U/L (ref 38–126)
Anion gap: 11 (ref 5–15)
BUN: 24 mg/dL — ABNORMAL HIGH (ref 8–23)
CO2: 29 mmol/L (ref 22–32)
Calcium: 8.9 mg/dL (ref 8.9–10.3)
Chloride: 96 mmol/L — ABNORMAL LOW (ref 98–111)
Creatinine, Ser: 1.42 mg/dL — ABNORMAL HIGH (ref 0.44–1.00)
GFR, Estimated: 38 mL/min — ABNORMAL LOW (ref >60)
Glucose, Bld: 112 mg/dL — ABNORMAL HIGH (ref 70–99)
Potassium: 4.6 mmol/L (ref 3.5–5.1)
Sodium: 136 mmol/L (ref 135–145)
Total Bilirubin: 0.6 mg/dL (ref 0.3–1.2)
Total Protein: 7.6 g/dL (ref 6.5–8.1)

## 2021-05-30 LAB — TSH: TSH: 8.995 u[IU]/mL — ABNORMAL HIGH (ref 0.350–4.500)

## 2021-05-30 MED ORDER — METOPROLOL SUCCINATE ER 25 MG PO TB24: 25.0000 mg | ORAL_TABLET | Freq: Every day | ORAL | 6 refills | Status: DC

## 2021-05-30 MED ORDER — AMIODARONE HCL 200 MG PO TABS: 100.0000 mg | ORAL_TABLET | Freq: Every day | ORAL | Status: DC

## 2021-05-30 NOTE — Patient Instructions (Signed)
Decrease Amiodarone 100 mg (1/2 tab) Daily  Restart Metoprolol XL 25 mg Daily  Labs done today, your results will be available in MyChart, we will contact you for abnormal readings.  You have been referred to the Pharmacy Clinic for weight loss medication, they will call you  Your physician recommends that you schedule a follow-up appointment in: 3 months  If you have any questions or concerns before your next appointment please send [name] a message through [place] or call our office at [phone].    TO LEAVE A MESSAGE FOR THE NURSE SELECT OPTION 2, PLEASE LEAVE A MESSAGE INCLUDING: YOUR NAME DATE OF BIRTH CALL BACK NUMBER REASON FOR CALL**this is important as we prioritize the call backs  YOU WILL RECEIVE A CALL BACK THE SAME DAY AS LONG AS YOU CALL BEFORE 4:00 PM  At the [facility], you and your health needs are our priority. As part of our continuing mission to provide you with exceptional heart care, we have created designated Provider Care Teams. These Care Teams include your primary Cardiologist (physician) and Advanced Practice Providers (APPs- Physician Assistants and Nurse Practitioners) who all work together to provide you with the care you need, when you need it.   You may see any of the following providers on your designated Care Team at your next follow up: Dr Glori Bickers Dr Haynes Kerns, NP Lyda Jester, [place] [facility], [place] Audry Riles, PharmD   Please be sure to bring in all your medications bottles to every appointment.

## 2021-05-30 NOTE — Progress Notes (Signed)
Advanced Heart Failure Clinic Note   PCP:  Ann Held, DO  HF Cardiologist: Dr. [name]  History of Present Illness: Heidi Stark is a 80 y.o. female who has history of HTN, DM, hyperlipidemia.  She was admitted in 4/15 with splenic and renal infarcts.  She was found to have paroxysmal atrial fibrillation.  In 4/15, she had fever and flank pain.  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  TEE showed prominent aortic plaque.  While she was in the hospital, she was noted to have a brief run of atrial fibrillation and was started on coumadin.     She has left leg pain with ambulation after about 100 feet, LE dopplers 5/15 showed occluded left external iliac artery.  This has been present ever since she had left iliac damage with a prior back surgery.     She was stable until 1/18, when she developed palpitations and exertional dyspnea.  She was admitted to [facility] with atrial fibrillation/RVR and acute systolic CHF.  EF was found to be 30-35%, down from 50-55% in 2015.  She was diuresed and underwent DCCV back to NSR.  She subsequently had atrial fibrillation ablation in 4/18.  She went back into atrial fibrillation in 5/19 and had DCCV back today NSR.    Echo was done 6/19 and reviewed, EF 55%, mild LVH, moderate diastolic dysfunction, normal RV size and systolic function.    With recurrent atrial fibrillation, she was admitted in 10/19 to start Tikosyn.  She was cardioverted back to NSR.   She had a recent admission in 2/22 with lightheadedness/presyncope that appeared to correlate with runs of VT.  She was on Tikosyn for atrial fibrillation though VT not thought to be due to Tikosyn (not polymorphic, QT not prolonged).  Tikosyn was stopped and amiodarone was started.  Ecoh in 2/22 showed EF 50-55% but cMRI (2/22) showed EF 40% with subendocardial scar in the basal to mid inferolateral wall, RV EF was 38%.  She had coronary angiography showing  no obstructive disease.  VT subsided and she was sent home on amiodarone.    09/13/20, she had 3 episodes of presyncope in succession while sitting at a desk.  No prodrome, each episode of severe lightheadedness lasted maybe 30 seconds.  She did not pass out. Because of these events, she came to the ER. She was admitted for observation and amiodarone gtt was restarted.  She had no VT overnight.  She remained in atypical atrial flutter today (was in atypical flutter at last admission). She had a single chamber Medtronic ICD placed 09/15/20. Discharge weight 256 lbs.   She had post hospital f/u on 09/22/20 and was noted to be back in atrial flutter w/ CVR. Volume status was ok and she endorsed stable NYHA Class II-III symptoms. She was set up for repeat DCCV 3 weeks later.   She underwent DCCV on 10/11/20 and converted back to NSR after 1 shock at 200J.   She had COVID-19 in 9/22.   She returns for followup of CHF and atrial fibrillation.  She is in NSR, no recent AF on device interrogation.  She has quit smoking since 2/22.  Weight is up about 3 lbs.  She has significant low back pain and is not walking much. No dyspnea walking around the house.  No chest pain.  No orthopnea/PND.  No palpitations.    ECG (personally reviewed): NSR, RBBB, 1st degree AVB  MDT device interrogation: Stable thoracic impedance.  No AF/VT.   Labs (4/15): K 4.1, creatinine 0.7, HCT 46.2 Labs (7/15): K 3.9, creatinine 0.8, HCT 50 Labs (12/15): K 5, creatinine 0.7, HDL 33, LDL 57 Labs (5/16): K 4, creatinine 0.84, TGs 256, HDL 29, LDL 42, HCT 41.2 Labs (1/18): K 3.4, creatinine 0.78 Labs (2/18): K 4.4, creatinine 0.8, free T3/T4 normal, mildly elevated TSH, LFTs normal, hgb 15.6 Labs (4/18): K 4.2, creatinine 0.86, TSH elevated, free T3 and free T4 normal Labs (5/19): K 3.9, creatinine 0.78 Labs (6/19): K 4.4, creatinine 0.88 Labs (12/19): K 4.2, creatinine 0.85, TGs 322, LDL 77 Labs (2/20): LDL 46, TGs 362 Labs (3/20): K  4.7, creatinine 1.07 Labs (11/20): TGs 282, LDL 92 Labs (3/22): K 4.1, creatinine 1.23 Labs (4/22): K 4.2, creatinine 1.2, LFTs normal, TSH mildly elevated, free T4 mildly elevated, normal T3.  Labs (6/22): TSH mildly elevated, free T3 and T4 normal Labs (7/22): TGs 292, LDL 94 Labs (10/22): K 3.8, creatinine 1.04, BNP 62, hgb 14.1, LFTs normal  PMH: 1.Type II diabetes 2. HTN 3. Hyperlipidemia 4. COPD: has quit smoking.  5. CAD: Coronary CTA (4/18) with nonobstructive coronary disease, calcium score 335 Agatston units (82nd percentile).  - Coronary angiography (2/22) with no obstructive disease 6. Paroxysmal atrial fibrillation: First noted in 4/15.  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   - Event monitor (4/15) with no atrial fibrillation.  - Admission with atrial fibrillation/RVR in 1/18.  - Atrial fibrillation ablation in 4/18.  - DCCV in 5/19.  - Tikosyn started + DCCV in 10/19. VT => Tikosyn stopped and amiodarone begun in 3/22.   - S/p single chamber MDT ICD 09/15/20 - DCCV to NSR in 3/22.  7. Chronic systolic CHF: ?tachycardia-mediated as first noted in setting of atrial fibrillation with RVR.  - TEE (4/15) with EF 50-55%, mild LVH, grade III-IV plaque in descending thoracic aorta, RV normal, no PFO, peak RV-RA gradient 42 mmHg.   - Echo (1/18) with EF 30-35%, diffuse hypokinesis, moderate LVH, mildly dilated RV, PASP 55 mmHg.  - Echo (4/18) with EF 50%, anterolateral/inferolateral mild hypokinesis, mild aortic stenosis, IVC normal.  - Echo (6/19) with EF 55%, mild LVH, moderate diastolic dysfunction, normal RV size and systolic function.  - cMRI (2/22) with EF 40% with subendocardial scar in the basal to mid inferolateral wall, RV EF was 38%. - Echo (2/22) with EF 50-55%, normal RV.  7. H/o diskectomy. 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 Lower extremity arterial dopplers (5/15) with occluded left external iliac  artery.  - ABIs (5/18): 1.16 (normal) on right, 0.56 on left.  9. Aortic stenosis: Mild on 4/18 echo.  10. OSA: Uses CPAP 11. VT: 2/22.  No significant CAD on cath in 2/22.  cMRI with EF 40% with subendocardial scar in the basal to mid inferolateral wall, RV EF was 38%. - Medtronic ICD placed 3/22.  12. COVID-19 9/22  Current Outpatient Medications  Medication Sig Dispense Refill   acetaminophen (TYLENOL) 500 MG tablet Take 500 mg by mouth every 6 (six) hours as needed for moderate pain or headache.     albuterol (VENTOLIN HFA) 108 (90 Base) MCG/ACT inhaler Inhale 2 puffs into the lungs every 4 (four) hours as needed for wheezing or shortness of breath. 8 g 0   apixaban (ELIQUIS) 5 MG TABS tablet Take 1 tablet (5 mg total) by mouth 2 (two) times daily. 60 tablet  11   cyclobenzaprine (FLEXERIL) 10 MG tablet TAKE 1 TABLET BY MOUTH THREE TIMES A DAY AS NEEDED FOR MUSCLE SPASMS 30 tablet 0   eplerenone (INSPRA) 25 MG tablet TAKE 1 TABLET (25 MG TOTAL) BY MOUTH DAILY. 90 tablet 1   FARXIGA 10 MG TABS tablet TAKE 1 TABLET BY MOUTH DAILY BEFORE BREAKFAST. 30 tablet 3   fenofibrate (TRICOR) 145 MG tablet TAKE 1 TABLET BY MOUTH EVERY DAY 90 tablet 3   furosemide (LASIX) 40 MG tablet Take 2 tablets (80 mg total) by mouth 2 (two) times daily. 270 tablet 3   HUMALOG MIX 75/25 KWIKPEN (75-25) 100 UNIT/ML KwikPen Inject 30-40 Units into the skin See admin instructions. Take 40 units in the AM, then take 30 units in the afternoon per patient     icosapent Ethyl (VASCEPA) 1 g capsule TAKE 2 CAPSULES BY MOUTH TWICE A DAY 120 capsule 5   LIVALO 4 MG TABS Take 1 tablet by mouth daily.     metoprolol succinate (TOPROL XL) 25 MG 24 hr tablet Take 1 tablet (25 mg total) by mouth daily. 30 tablet 6   Multiple Vitamin (MULTIVITAMIN WITH MINERALS) TABS tablet Take 1 tablet by mouth daily.     potassium chloride (KLOR-CON M10) 10 MEQ tablet Take 2 tablets (20 mEq total) by mouth 2 (two) times daily. 120 tablet 11    sacubitril-valsartan (ENTRESTO) 49-51 MG Take 1 tablet by mouth 2 (two) times daily. 60 tablet 3   amiodarone (PACERONE) 200 MG tablet Take 0.5 tablets (100 mg total) by mouth daily.     No current facility-administered medications for this encounter.    Allergies:   Neomycin-bacitracin zn-polymyx, Sulfonamide derivatives, Ciprofloxacin, and Spironolactone   Social History:  The patient  reports that she has quit smoking. Her smoking use included cigarettes. She has a 50.00 pack-year smoking history. She has never used smokeless tobacco. She reports that she does not drink alcohol and does not use drugs.   Family History:  The patient's family history includes Cancer in her mother; Diabetes in her father, mother, and another family member; Factor V Leiden deficiency in her daughter; Hypertension in her mother; Melanoma in an other family member; Obesity in her father and mother.   ROS:  Please see the history of present illness.   All other systems are personally reviewed and negative.   Exam:   BP 104/70   Pulse 74   Wt 121.7 kg (268 lb 3.2 oz)   SpO2 94%   BMI 43.29 kg/m   PHYSICAL EXAM: General: NAD Neck: No JVD, no thyromegaly or thyroid nodule.  Lungs: Clear to auscultation bilaterally with normal respiratory effort. CV: Nondisplaced PMI.  Heart regular S1/S2, no S3/S4, no murmur.  No peripheral edema.  No carotid bruit.  Unable to palpate left pedal pulses.  Abdomen: Soft, nontender, no hepatosplenomegaly, no distention.  Skin: Intact without lesions or rashes.  Neurologic: Alert and oriented x 3.  Psych: Normal affect. Extremities: No clubbing or cyanosis.  HEENT: Normal.   Recent Labs: 04/23/2021: B Natriuretic Peptide 61.7; Hemoglobin 14.1; Magnesium 2.5; Platelets 240 05/30/2021: ALT 26; BUN 24; Creatinine, Ser 1.42; Potassium 4.6; Sodium 136; TSH 8.995  Personally reviewed   Wt Readings from Last 3 Encounters:  05/30/21 121.7 kg (268 lb 3.2 oz)  04/16/21 120.2 kg  (264 lb 15.9 oz)  01/27/21 120.5 kg (265 lb 9.6 oz)   ASSESSMENT AND PLAN:  1. H/o VT: noted 08/2020. 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  Had been on Tikosyn but this was not the typical Tikosyn-induced polymorphic VT and she had had no changes in Tikosyn dosing prior to occurrence. cMRI showed subendocardial LGE in the basal to mid inferolateral wall. This appeared to be a coronary disease pattern however coronary angiogram did not show significant coronary disease.  She was transitioned from Tikosyn to amiodarone and underwent Medtronic single chamber ICD on 09/15/20. No further VT on device interrogation. She is on amiodarone 200 daily. Denies any further presyncopal spells.  No palpitations.  - Decrease amiodarone to 100 mg daily, check LFTs and TSH, regular eye exam needed.   2. Atrial fibrillation/flutter: Paroxysmal.  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 She was admitted in 1/18 with atrial fibrillation/RVR and CHF.  She was diuresed and had DCCV back to NSR. She had atrial fibrillation ablation in 4/18.  In 5/19, she went into atrial fibrillation transiently.  In 10/19, she was back in atrial fibrillation and was admitted for Tikosyn initiation and cardioversion.  Atypical atrial flutter noted in 12/21 with DCCV to NSR. As outlined above, now off Tikosyn and on amiodarone. Reverted back to rate controlled atypical atrial flutter at post hospital f/u 09/30/20. Underwent repeat DCCV in 3/22. Maintaining NSR on EKG today.  - Decrease amiodarone to 100 mg daily. Check LFTs and TFTs today. Needs yearly eye exams   - Continue Eliquis 5 mg bid.  3. Claudication: Left leg claudication with absent left PT pulse.  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   4. Chronic systolic => diastolic CHF: EF 34-91% on echo in 1/18, down from 50-55% in 4/15.  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 In 2/22, cMRI showed LV EF 40% with RV E 38%, inferolateral subendocardial LGE, but echo in 2/22 showed EF 50-55%.  Chronic NYHA class II-III symptoms (functional class complicated by heel pain/left leg pain, back pain, body habitus and deconditioning).  Weight fairly stable, she is not volume overloaded on exam or by Optivol.  - Continue lasix 80 mg bid for now.  BMET today.  - Restart Toprol XL 25 mg daily.   - Continue Entresto 49/51 mg bid.  - Continue eplerenone 25 mg daily.  - Continue dapagliflozin 10 mg daily.  5. CAD: Coronary CTA in 2018 showed coronary calcium score in the 82nd percentile with nonobstructive coronary disease. Cath in 2/22 showed only luminal irregularities. She denies anginal symptoms  - not on ASA due to Eliquis.  - Continue Vascepa and Livalo.  6. COPD:  History of smoking. Quit 2/22.  7. OSA: reports full nightly compliance w/ CPAP.  8. Obesity: I will refer her to pharmacy clinic for semaglutide.   F/u 3 months with APP.    Signed, [name], MD  05/30/2021  [facility] [address] [facility] [place] [address] [phone] (office) [phone] (fax)

## 2021-05-31 ENCOUNTER — Other Ambulatory Visit: Payer: Self-pay | Admitting: Cardiology

## 2021-05-31 MED ORDER — POTASSIUM CHLORIDE CRYS ER 10 MEQ PO TBCR: 20.0000 meq | EXTENDED_RELEASE_TABLET | Freq: Two times a day (BID) | ORAL | 11 refills | Status: DC

## 2021-05-31 NOTE — Addendum Note (Signed)
Encounter addended by: Kerry Dory, CMA on: 05/31/2021 9:34 AM  Actions taken: Medication long-term status modified, Visit diagnoses modified, Order list changed, Diagnosis association updated

## 2021-05-31 NOTE — Telephone Encounter (Signed)
This is a CHF pt 

## 2021-06-01 ENCOUNTER — Telehealth: Payer: Self-pay | Admitting: Student-PharmD

## 2021-06-01 LAB — T4, FREE: Free T4: 1.12 ng/dL (ref 0.61–1.12)

## 2021-06-01 MED ORDER — OZEMPIC (0.25 OR 0.5 MG/DOSE) 2 MG/1.5ML ~~LOC~~ SOPN: 0.2500 mg | PEN_INJECTOR | SUBCUTANEOUS | 0 refills | Status: DC

## 2021-06-01 NOTE — Telephone Encounter (Signed)
Received referral from Dr. [name] to start semaglutide for this patient for weight loss. They meet FDA approved criteria for semaglutide for use in obesity given BMI 43.29. Obesity is complicated by chronic conditions including HTN, HF, OSA, T2DM, HLD, PAD, aortic atherosclerosis.   No contraindications seen in the chart to [facility] use. Will need Patient confirmed they have no personal or family history of medullary thyroid carcinoma.   Patient is interested in starting semaglutide for weight loss. Given that she has Medicare and is already on multiple branded medications that she receives patient assistance for, we will likely need to apply for patient assistance for Ozempic as well which we can do since has has T2DM. She is out of the coverage gap right now and in catastrophic coverage as of June 2022 so she will be able to get Ozempic through the end of the year at an affordable cost but come the start of the year we can apply for patient assistance so this doesn't contribute further to her coverage gap. Ozempic is a tier 2 medication and doesn't appear to require a PA. We will plan to have her fill her first dose at [facility], provide her with a sample in the office for her 2nd month at 0.5 mg weekly, then send in an Rx for the 1 mg pens to pick up a 3 month supply in December so that we have some time in the new year to apply for patient assistance.   Last A1c uncontrolled at 9, so unlikely to need to empirically adjust insulin dose when starting GLP1 but will monitor closely for hypoglycemia and adjust insulin as BG improve on GLP1 to prevent hypoglycemia.   She is scheduled for her first injection appt on Friday 12/2. I called her pharmacy who confirmed Ozempic went through without a PA however they told me they have been out of stock of the 0.25/0.5 mg and 1 mg pens and aren't sure when they will get more. I called the patient to let her know this and to fill it if they call that they have some in stock but  to still come to her appointment on 12/2 and we can start her on samples.

## 2021-06-02 LAB — T3, FREE: T3, Free: 2.4 pg/mL (ref 2.0–4.4)

## 2021-06-15 ENCOUNTER — Encounter: Payer: Self-pay | Admitting: Family Medicine

## 2021-06-15 DIAGNOSIS — E785 Hyperlipidemia, unspecified: Secondary | ICD-10-CM

## 2021-06-15 DIAGNOSIS — Z794 Long term (current) use of insulin: Secondary | ICD-10-CM

## 2021-06-15 DIAGNOSIS — E1165 Type 2 diabetes mellitus with hyperglycemia: Secondary | ICD-10-CM

## 2021-06-16 ENCOUNTER — Ambulatory Visit (INDEPENDENT_AMBULATORY_CARE_PROVIDER_SITE_OTHER): Payer: Medicare Other

## 2021-06-16 DIAGNOSIS — I472 Ventricular tachycardia, unspecified: Secondary | ICD-10-CM

## 2021-06-16 LAB — CUP PACEART REMOTE DEVICE CHECK
Battery Remaining Longevity: 132 mo
Battery Voltage: 3.03 V
Brady Statistic RV Percent Paced: 0.03 %
Date Time Interrogation Session: 20221201072305
HighPow Impedance: 92 Ohm
Implantable Lead Implant Date: 20220302
Implantable Lead Location: 753860
Implantable Pulse Generator Implant Date: 20220302
Lead Channel Impedance Value: 627 Ohm
Lead Channel Impedance Value: 703 Ohm
Lead Channel Pacing Threshold Amplitude: 2.5 V
Lead Channel Pacing Threshold Pulse Width: 0.4 ms
Lead Channel Sensing Intrinsic Amplitude: 11.625 mV
Lead Channel Sensing Intrinsic Amplitude: 11.625 mV
Lead Channel Setting Pacing Amplitude: 5 V
Lead Channel Setting Pacing Pulse Width: 1 ms
Lead Channel Setting Sensing Sensitivity: 0.3 mV

## 2021-06-17 ENCOUNTER — Other Ambulatory Visit: Payer: Self-pay

## 2021-06-17 ENCOUNTER — Ambulatory Visit: Payer: Medicare Other | Admitting: Pharmacist

## 2021-06-17 DIAGNOSIS — E1165 Type 2 diabetes mellitus with hyperglycemia: Secondary | ICD-10-CM

## 2021-06-17 DIAGNOSIS — Z794 Long term (current) use of insulin: Secondary | ICD-10-CM | POA: Diagnosis not present

## 2021-06-17 NOTE — Progress Notes (Signed)
Patient ID: Heidi Stark                 DOB: 1941/07/15                    MRN: [id number]     HPI: Heidi Stark is a 80 y.o. female patient referred to pharmacy clinic by Dr. [name] to initiate weight loss therapy with GLP1-RA. PMH is significant for obesity complicated by chronic medical conditions including HTN, DM, hyperlipidemia, splenic and renal infarcts, afib, CHF. Most recent BMI 43.39.  Patient presents to clinic today to discuss starting a GLP-1 for DM and weight loss. She has limited mobility due to back issues. She had back surgery once and they removed part of her iliac artery- she  coded twice per patient account. She refuses to have any more back surgery. She walks with a walker but her back gives out if she walks to long. She takes humalog 75/25 mix twice a day. Checks her blood sugar in the AM and before her evening meal. Blood sugar is usually 140-170.  She is on a lot of branded medications and cost if hard sometimes. Spent $800 on medications one month when in the coverage gap. A Education officer, museum gave her info on patient assistance.   Current weight management medications: none  Previously tried meds: none  Current meds that may affect weight: insulin  Baseline weight/BMI: 268.8 lb/   Insurance payor: [employer]  Diet:  -Breakfast: bowl of Rasin bran and a piece of toast -Lunch: salad (lettuce, tomato, [place], sometime ham) -Dinner: sometimes doesn't eat dinner, other times BBQ, chicken dish -Snacks: none -Drinks: crystal light, water  Exercise: some physical fitness exercise  Family History: The patient's family history includes Cancer in her mother; Diabetes in her father, mother, and another family member; Factor V Leiden deficiency in her daughter; Hypertension in her mother; Melanoma in an other family member; Obesity in her father and mother.  Social History: The patient  reports that she has quit smoking. Her smoking use included  cigarettes. She has a 50.00 pack-year smoking history. She has never used smokeless tobacco. She reports that she does not drink alcohol and does not use drugs.     Labs: Lab Results  Component Value Date   HGBA1C 9.0 (H) 04/15/2021    Wt Readings from Last 1 Encounters:  05/30/21 268 lb 3.2 oz (121.7 kg)    BP Readings from Last 1 Encounters:  05/30/21 104/70   Pulse Readings from Last 1 Encounters:  05/30/21 74       Component Value Date/Time   CHOL 186 01/27/2021 1146   CHOL 170 06/02/2019 0913   TRIG 292 (H) 01/27/2021 1146   HDL 34 (L) 01/27/2021 1146   HDL 31 (L) 06/02/2019 0913   CHOLHDL 5.5 01/27/2021 1146   VLDL 58 (H) 01/27/2021 1146   LDLCALC 94 01/27/2021 1146   LDLCALC 92 06/02/2019 0913   LDLCALC 77 06/28/2018 1410   LDLDIRECT 42.0 11/30/2014 1226    Past Medical History:  Diagnosis Date   A-fib ([place])    Arthritis    "hands, wrists" (11/07/2016)   CHF (congestive heart failure) (HCC)    Chronic lower back pain    Constipation    COPD (chronic obstructive pulmonary disease) (HCC)    DVT (deep venous thrombosis) (HCC)    Dyspnea    History of blood transfusion 1990   "related to OR"   History of  kidney stones    Hyperlipidemia    Hypertension    Joint pain    Lower extremity edema    OSA on CPAP    Persistent atrial fibrillation (HCC)    Pneumonia    "several times" (11/07/2016)   Type II diabetes mellitus ([place])     Current Outpatient Medications on File Prior to Visit  Medication Sig Dispense Refill   acetaminophen (TYLENOL) 500 MG tablet Take 500 mg by mouth every 6 (six) hours as needed for moderate pain or headache.     albuterol (VENTOLIN HFA) 108 (90 Base) MCG/ACT inhaler Inhale 2 puffs into the lungs every 4 (four) hours as needed for wheezing or shortness of breath. 8 g 0   amiodarone (PACERONE) 200 MG tablet Take 0.5 tablets (100 mg total) by mouth daily.     apixaban (ELIQUIS) 5 MG TABS tablet Take 1 tablet (5 mg total) by mouth 2  (two) times daily. 60 tablet 11   cyclobenzaprine (FLEXERIL) 10 MG tablet TAKE 1 TABLET BY MOUTH THREE TIMES A DAY AS NEEDED FOR MUSCLE SPASMS 30 tablet 0   eplerenone (INSPRA) 25 MG tablet TAKE 1 TABLET (25 MG TOTAL) BY MOUTH DAILY. 90 tablet 1   FARXIGA 10 MG TABS tablet TAKE 1 TABLET BY MOUTH DAILY BEFORE BREAKFAST. 30 tablet 3   fenofibrate (TRICOR) 145 MG tablet TAKE 1 TABLET BY MOUTH EVERY DAY 90 tablet 3   furosemide (LASIX) 40 MG tablet Take 2 tablets (80 mg total) by mouth 2 (two) times daily. 270 tablet 3   HUMALOG MIX 75/25 KWIKPEN (75-25) 100 UNIT/ML KwikPen Inject 30-40 Units into the skin See admin instructions. Take 40 units in the AM, then take 30 units in the afternoon per patient     icosapent Ethyl (VASCEPA) 1 g capsule TAKE 2 CAPSULES BY MOUTH TWICE A DAY 120 capsule 5   LIVALO 4 MG TABS TAKE 1 TABLET BY MOUTH EVERY DAY 90 tablet 3   metoprolol succinate (TOPROL XL) 25 MG 24 hr tablet Take 1 tablet (25 mg total) by mouth daily. 30 tablet 6   Multiple Vitamin (MULTIVITAMIN WITH MINERALS) TABS tablet Take 1 tablet by mouth daily.     potassium chloride (KLOR-CON M10) 10 MEQ tablet Take 2 tablets (20 mEq total) by mouth 2 (two) times daily. 120 tablet 11   sacubitril-valsartan (ENTRESTO) 49-51 MG Take 1 tablet by mouth 2 (two) times daily. 60 tablet 3   Semaglutide,0.25 or 0.5MG/DOS, (OZEMPIC, 0.25 OR 0.5 MG/DOSE,) 2 MG/1.5ML SOPN Inject 0.25 mg into the skin once a week. 1.5 mL 0   No current facility-administered medications on file prior to visit.    Allergies  Allergen Reactions   Neomycin-Bacitracin Zn-Polymyx Rash   Sulfonamide Derivatives Other (See Comments)    Stomach cramps   Ciprofloxacin Hives, Itching and Rash    Pt doesn't remember having any reaction to cipro   Spironolactone Rash     Assessment/Plan:  1. Weight loss - Patient has not met goal of at least 5% of body weight loss with comprehensive lifestyle modifications alone in the past 3-6 months.  Pharmacotherapy is appropriate to pursue as augmentation. Will start Ozempic 0.28m weekly. Confirmed patient not pregnant and no personal or family history of medullary thyroid carcinoma (MTC) or Multiple Endocrine Neoplasia syndrome type 2 (MEN 2).   Advised patient on common side effects including nausea, diarrhea, dyspepsia, decreased appetite, and fatigue. Counseled patient on reducing meal size and how to titrate medication to minimize  side effects. Counseled patient to call if intolerable side effects or if experiencing dehydration, abdominal pain, or dizziness. Patient will adhere to dietary modifications and will target at least 150 minutes of moderate intensity exercise weekly.   We discussed diet and exercise in detail. Recommended patient purchase a desk cycle bike to aid in seated exercises. Also recommended band exercises, leg raises and push press with very light weight. Discussed a diet high in beans, nuts, vegetables, some meat. Avoid anything that is "instant", flavor oatmeal or yogurt with fruit and nuts or peanut butter. Limits pastas, rice, potatoes (does not eat much of them to begin with).    Since patients blood sugars are not at goal, will not decrease insulin. I asked patient to keep an eye on her blood sugars incase they start to trend too low as we increase ozempic dose.  She was given an application for novo nordisk patient assistance. I encouraged her to apply for patient assistance for all of her brand medications (except Eliquis) at the beginning of the year.   I will call pt in 4 weeks to follow up. She plans to start Ozempic tomorrow. Follow up in person in 3 months.  Thank you,  Ramond Dial, Pharm.D, BCPS, CPP [place]  [address], [place], [place] [address]  Phone: [phone]; Fax: [phone]

## 2021-06-17 NOTE — Patient Instructions (Addendum)
Use natural peanut butter to flavor yogurt or oatmeal Avoid instant products Look into a desk cycle for exercise  Good resources:  Eat. Sleep. Move. Breath: The Beginner's Guide to Living A Healthy Lifestyle  Eat, Drink, and Be Healthy: The [employer] Guide to Healthy Eating (2007 version)  Zoe podcast   GLP-1 Receptor Agonist Counseling Points This medication reduces your appetite and may make you feel fuller longer.  Stop eating when your body tells you that you are full. This will likely happen sooner than you are used to. Store your medication in the fridge until you are ready to use it. Inject your medication in the fatty tissue of your lower abdominal area (2 inches away from belly button) or upper outer thigh. Rotate injection sites. Each pen will last you about 1 month (the first month it will last a few weeks longer). Use a different needle with each weekly injection. Common side effects include: nausea, diarrhea/constipation, and heartburn, and are more likely to occur if you overeat.  Dosing schedule: - Week 1: Inject 0.25 once weekly - Week 2: Inject 0.25 once weekly - Week 3: Inject 0.25 once weekly - Week 4: Inject 0.25 once weekly  Tips for living a healthier life     Building a Healthy and Balanced Diet Make most of your meal vegetables and fruits -  of your plate. Aim for color and variety, and remember that potatoes don't count as vegetables on the Healthy Eating Plate because of their negative impact on blood sugar.  Go for whole grains -  of your plate. Whole and intact grains--whole wheat, barley, wheat berries, quinoa, oats, brown rice, and foods made with them, such as whole wheat pasta--have a milder effect on blood sugar and insulin than white bread, white rice, and other refined grains.  Protein power -  of your plate. Fish, poultry, beans, and nuts are all healthy, versatile protein sources--they can be mixed into salads, and pair well  with vegetables on a plate. Limit red meat, and avoid processed meats such as bacon and sausage.  Healthy plant oils - in moderation. Choose healthy vegetable oils like olive, canola, soy, corn, sunflower, peanut, and others, and avoid partially hydrogenated oils, which contain unhealthy trans fats. Remember that low-fat does not mean "healthy."  Drink water, coffee, or tea. Skip sugary drinks, limit milk and dairy products to one to two servings per day, and limit juice to a small glass per day.  Stay active. The red figure running across the [place] is a reminder that staying active is also important in weight control.  The main message of the Healthy Eating Plate is to focus on diet quality:  The type of carbohydrate in the diet is more important than the amount of carbohydrate in the diet, because some sources of carbohydrate--like vegetables (other than potatoes), fruits, whole grains, and beans--are healthier than others. The Healthy Eating Plate also advises consumers to avoid sugary beverages, a major source of calories--usually with little nutritional value--in the American diet. The Healthy Eating Plate encourages consumers to use healthy oils, and it does not set a maximum on the percentage of calories people should get each day from healthy sources of fat. In this way, the Healthy Eating Plate recommends the opposite of the low-fat message promoted for decades by the USDA.  [email or web address]no  SUGAR  Sugar is a huge problem in the modern day diet. Sugar is a big contributor to heart disease, diabetes, high  triglyceride levels, fatty liver disease and obesity. Sugar is hidden in almost all packaged foods/beverages. Added sugar is extra sugar that is added beyond what is naturally found and has no nutritional benefit for your body. The American Heart Association recommends limiting added sugars to no more than 25g  for women and 36 grams for men per day. There are many names for sugar including maltose, sucrose (names ending in "ose"), high fructose corn syrup, molasses, cane sugar, corn sweetener, raw sugar, syrup, honey or fruit juice concentrate.   One of the best ways to limit your added sugars is to stop drinking sweetened beverages such as soda, sweet tea, and fruit juice.  There is 65g of added sugars in one 20oz bottle of Coke! That is equal to 7.5 donuts.   Pay attention and read all nutrition facts labels. Below is an examples of a nutrition facts label. The #1 is showing you the total sugars where the # 2 is showing you the added sugars. This one serving has almost the max amount of added sugars per day!     20 oz Soda 65g Sugar = 7.5 Glazed Donuts  16oz Energy  Drink 54g Sugar = 6.5 Glazed Donuts  Large Sweet  Tea 38g Sugar = 4 Glazed Donuts  20oz Sports  Drink 34g Sugar = 3.5 Glazed Donuts  8oz Chocolate Milk 24g Sugar =2.5 Glazed Donuts  8oz Orange  Juice 21g Sugar = 2 Glazed Donuts  1 Juice Box 14g Sugar = 1.5 Glazed Donuts  16oz Water= NO SUGAR!!  EXERCISE  Exercise is good. We've all heard that. In an ideal world, we would all have time and resources to get plenty of it. When you are active, your heart pumps more efficiently and you will feel better.  Multiple studies show that even walking regularly has benefits that include living a longer life. The American Heart Association recommends 150 minutes per week of exercise (30 minutes per day most days of the week). You can do this in any increment you wish. Nine or more 10-minute walks count. So does an hour-long exercise class. Break the time apart into what will work in your life. Some of the best things you can do include walking briskly, jogging, cycling or swimming laps. Not everyone is ready to "exercise." Sometimes we need to start with just getting active. Here are some easy ways to be more active throughout the  day:  Take the stairs instead of the elevator  Go for a 10-15 minute walk during your lunch break (find a friend to make it more enjoyable)  When shopping, park at the back of the parking lot  If you take public transportation, get off one stop early and walk the extra distance  Pace around while making phone calls  Check with your doctor if you aren't sure what your limitations may be. Always remember to drink plenty of water when doing any type of exercise. Don't feel like a failure if you're not getting the 90-150 minutes per week. If you started by being a couch potato, then just a 10-minute walk each day is a huge improvement. Start with little victories and work your way up.   HEALTHY EATING TIPS  When looking to improve your eating habits, whether to lose weight, lower blood pressure or just be healthier, it helps to know what a serving size is.   Grains 1 slice of bread,  bagel,  cup pasta or rice  Vegetables 1 cup fresh or  raw vegetables,  cup cooked or canned Fruits 1 piece of medium sized fruit,  cup canned,   Meats/Proteins  cup dried       1 oz meat, 1 egg,  cup cooked beans, nuts or seeds  Dairy        Fats Individual yogurt container, 1 cup (8oz)    1 teaspoon margarine/butter or vegetable  milk or milk alternative, 1 slice of cheese          oil; 1 tablespoon mayonnaise or salad dressing                  Plan ahead: make a menu of the meals for a week then create a grocery list to go with that menu. Consider meals that easily stretch into a night of leftovers, such as stews or casseroles. Or consider making two of your favorite meal and put one in the freezer for another night. Try a night or two each week that is "meatless" or "no cook" such as salads. When you get home from the grocery store wash and prepare your vegetables and fruits. Then when you need them they are ready to go.   Tips for going to the grocery store:  [place] store or generic brands  Check the  weekly ad from your store on-line or in their in-store flyer  Look at the unit price on the shelf tag to compare/contrast the costs of different items  Buy fruits/vegetables in season  Carrots, bananas and apples are low-cost, naturally healthy items  If meats or frozen vegetables are on sale, buy some extras and put in your freezer  Limit buying prepared or "ready to eat" items, even if they are pre-made salads or fruit snacks  Do not shop when you're hungry  Foods at eye level tend to be more expensive. Look on the high and low shelves for deals.  Consider shopping at the farmer's market for fresh foods in season.  Avoid the cookie and chip aisles (these are expensive, high in calories and low in nutritional value). Shop on the outside of the grocery store.  Healthy food preparations:  If you can't get lean hamburger, be sure to drain the fat when cooking  Steam, saut (in olive oil), grill or bake foods  Experiment with different seasonings to avoid adding salt to your foods. Kosher salt, sea salt and Himalayan salt are all still salt and should be avoided. Try seasoning food with onion, garlic, thyme, rosemary, basil ect. Onion powder or garlic powder is ok. Avoid if it says salt (ie garlic salt).

## 2021-06-20 ENCOUNTER — Ambulatory Visit (INDEPENDENT_AMBULATORY_CARE_PROVIDER_SITE_OTHER): Payer: Medicare Other | Admitting: Pharmacist

## 2021-06-20 DIAGNOSIS — Z6841 Body Mass Index (BMI) 40.0 and over, adult: Secondary | ICD-10-CM

## 2021-06-20 DIAGNOSIS — E1165 Type 2 diabetes mellitus with hyperglycemia: Secondary | ICD-10-CM

## 2021-06-20 DIAGNOSIS — I5020 Unspecified systolic (congestive) heart failure: Secondary | ICD-10-CM

## 2021-06-20 DIAGNOSIS — I10 Essential (primary) hypertension: Secondary | ICD-10-CM

## 2021-06-20 DIAGNOSIS — I480 Paroxysmal atrial fibrillation: Secondary | ICD-10-CM

## 2021-06-20 DIAGNOSIS — E785 Hyperlipidemia, unspecified: Secondary | ICD-10-CM

## 2021-06-20 LAB — HM DIABETES EYE EXAM

## 2021-06-20 NOTE — Chronic Care Management (AMB) (Signed)
Chronic Care Management Pharmacy Note  06/20/2021 Name:  Heidi Stark MRN:  [id number] DOB:  Nov 27, 1940  Summary: Heidi Stark coverage gap in March 2022. Current she is out of gap and into catastrophic coverage as of June 2022. Current cost of medications OK but spent a lot on medications earlier in 2022.  Blood glucose not optimally controlled but improving since started Ozempic 06/18/2021 LDL not at goal  Recommendations/Changes made from today's visit: Sent applications for medication assistance program for 2023 for Livalo, Entresto and Humalog Mix. Reminded patient to complete and return to [facility].   Will continue to follow in 2023 and send Eliquis medication assistance program as needed since cannot apply until she hits coverage gap.  Continue Ozempic 0.67m weekly for 4 weeks, then 0.527mweekly thereafter.   Subjective: Heidi Stark an [age] year old female who is a primary patient of Heidi HeldDO.  The CCM team was consulted for assistance with disease management and care coordination needs.    Engaged with patient by telephone for follow up visit in response to provider referral for pharmacy case management and/or care coordination services.   Consent to Services:  The patient was given information about Chronic Care Management services, agreed to services, and gave verbal consent prior to initiation of services.  Please see initial visit note for detailed documentation.   Patient Care Team: Heidi HerterYvAlferd ApaDO as PCP - General CaConstance HawMD as PCP - Electrophysiology (Cardiology) Heidi MutterMD as Consulting Physician (Ophthalmology) [name] as Consulting Physician (Endocrinology) [name] as Case Manager Heidi RobinsRPH-CPP (Pharmacist)  Recent office visits: 05/05/2021 - PCP - phone call. Requested antibiotic. Prescribed for Levaquin 50071mor 7 days  05/03/2021 - PCP (Dr LowEtter Sjogrenideo Visit for back  pain. Increased cyclobenzaprine 30m47m to 3 times a day as needed.   Recent consult visits: 06/17/2021 - Cardio (clinical pharmacist Heidi Bellsarted Ozempic 0.25mg59mkly. Follow up in 3 months. Provided with Novo Nordick patient assistance applicaiton. 05/30/2021 - Cardio (Dr McLeaAundra Dubinn for CHF and atrial fib. Decreaesd amiodarone from 200mg 12my to 100mg d53m. Restarted metoprolol ER 25mg da68m Referred to pharmacy clinic to start semaglutide fro obesity 01/27/2021 - Cardio (Dr McLean) Heidi Dubininic. No med changes. Rechecked TSH - WNL.  01/24/2021 - Endo (Dr Balan) nChalmers Cateres available 12/20/2020 - Cardio (Dr Heidi Stark)Heidi Bearsor atrial fibrillation.  noted mildly elevated TSH. No supplementation start yet.  12/09/2020 - Cardio (Dr McLean) Heidi Dubininic. Added Farxiga 30mg dai71m  [facility]: [facility] Received Remdesivir. beta blocker stopped due to bradycardia.  New Medications at discharge: levofloxacin 500mg dail65mdications Stopped at discharge: metoprolol succinate 25mg Medic52mns Changed at Discharge: none   Objective:  Lab Results  Component Value Date   CREATININE 1.42 (H) 05/30/2021   CREATININE 1.04 (H) 04/23/2021   CREATININE 1.17 (H) 04/22/2021    Lab Results  Component Value Date   HGBA1C 9.0 (H) 04/15/2021   Last diabetic Eye exam:  Lab Results  Component Value Date/Time   HMDIABEYEEXA No Retinopathy 08/10/2015 12:00 AM    Last diabetic Foot exam: No results found for: HMDIABFOOTEX      Component Value Date/Time   CHOL 186 01/27/2021 1146   CHOL 170 06/02/2019 0913   TRIG 292 (H) 01/27/2021 1146   HDL 34 (L) 01/27/2021 1146   HDL 31 (L)  06/02/2019 0913   CHOLHDL 5.5 01/27/2021 1146   VLDL 58 (H) 01/27/2021 1146   LDLCALC 94 01/27/2021 1146   LDLCALC 92 06/02/2019 0913   LDLCALC 77 06/28/2018 1410   LDLDIRECT 42.0 11/30/2014 1226    Hepatic Function Latest Ref Rng &  Units 05/30/2021 04/23/2021 04/22/2021  Total Protein 6.5 - 8.1 g/dL 7.6 6.1(L) 6.0(L)  Albumin 3.5 - 5.0 g/dL 3.6 2.5(L) 2.6(L)  AST 15 - 41 U/L 39 32 32  ALT 0 - 44 U/L _0 Alk Phosphatase 38 - 126 U/L 62 45 46  Total Bilirubin 0.3 - 1.2 mg/dL 0.6 0.6 0.7  Bilirubin, Direct 0.0 - 0.2 mg/dL - - -    Lab Results  Component Value Date/Time   TSH 8.995 (H) 05/30/2021 11:23 AM   TSH 1.188 04/18/2021 12:59 AM   TSH 5.510 (H) 12/20/2020 02:17 PM   TSH 1.740 09/09/2018 01:26 PM   FREET4 1.12 05/30/2021 11:23 AM   FREET4 1.51 12/20/2020 02:17 PM    CBC Latest Ref Rng & Units 04/23/2021 04/22/2021 04/21/2021  WBC 4.0 - 10.5 K/uL 9.0 8.7 10.1  Hemoglobin 12.0 - 15.0 g/dL 14.1 14.6 14.4  Hematocrit 36.0 - 46.0 % 42.9 45.1 45.1  Platelets 150 - 400 K/uL 240 179 213    Lab Results  Component Value Date/Time   VD25OH 4.3 (L) 09/09/2018 01:26 PM   VD25OH 22 (L) 11/18/2009 08:27 PM    Clinical ASCVD: Yes  The 10-year ASCVD risk score (Arnett DK, et al., 2019) is: 38.7%   Values used to calculate the score:     Age: 80 years     Sex: Female     Is Non-Hispanic African American: No     Diabetic: Yes     Tobacco smoker: No     Systolic Blood Pressure: 825 mmHg     Is BP treated: Yes     HDL Cholesterol: 34 mg/dL     Total Cholesterol: 186 mg/dL    Other: CHADS2VASc = 6  Social History   Tobacco Use  Smoking Status Former   Packs/day: 1.00   Years: 50.00   Pack years: 50.00   Types: Cigarettes   Quit date: 08/17/2020   Years since quitting: 0.8  Smokeless Tobacco Never   BP Readings from Last 3 Encounters:  06/17/21 110/70  05/30/21 104/70  04/23/21 (!) 105/54   Pulse Readings from Last 3 Encounters:  06/17/21 75  05/30/21 74  04/23/21 (!) 52   Wt Readings from Last 3 Encounters:  06/17/21 268 lb 12.8 oz (121.9 kg)  05/30/21 268 lb 3.2 oz (121.7 kg)  04/16/21 264 lb 15.9 oz (120.2 kg)    Assessment: Review of patient past medical history, allergies,  medications, health status, including review of consultants reports, laboratory and other test data, was performed as part of comprehensive evaluation and provision of chronic care management services.   SDOH:  (Social Determinants of Health) assessments and interventions performed:  SDOH Interventions    Flowsheet Row Most Recent Value  SDOH Interventions   Financial Strain Interventions Other (Comment)  [patient has applications for PAP for Livalo, Humalog, Ozempic and Entresto.]  Physical Activity Interventions Other (Comments), Patient Refused  [unable to exercise due to back pain - patient declines back surgery due to past issues with back surgery.]       CCM Care Plan  Allergies  Allergen Reactions   Neomycin-Bacitracin Zn-Polymyx Rash   Sulfonamide Derivatives Other (See Comments)  Stomach cramps   Ciprofloxacin Hives, Itching and Rash    Pt doesn't remember having any reaction to cipro   Spironolactone Rash    Medications Reviewed Today     Reviewed by Stanford Scotland, RN (Registered Nurse) on 05/30/21 at [place] List Status: <None>   Medication Order Taking? Sig Documenting Provider Last Dose Status Informant  acetaminophen (TYLENOL) 500 MG tablet [id number] Yes Take 500 mg by mouth every 6 (six) hours as needed for moderate pain or headache. [provider] Taking Active Self  albuterol (VENTOLIN HFA) 108 (90 Base) MCG/ACT inhaler [id number] Yes Inhale 2 puffs into the lungs every 4 (four) hours as needed for wheezing or shortness of breath. Geradine Girt, DO Taking Active   amiodarone (PACERONE) 200 MG tablet [id number] Yes Take 200 mg by mouth daily. [provider] Taking Active Self  apixaban (ELIQUIS) 5 MG TABS tablet [id number] Yes Take 1 tablet (5 mg total) by mouth 2 (two) times daily. Lyda Jester M, PA-C Taking Active Self  cyclobenzaprine (FLEXERIL) 10 MG tablet [id number] Yes TAKE 1 TABLET BY MOUTH THREE TIMES A DAY AS NEEDED FOR  MUSCLE SPASMS Ann Held, DO Taking Active   eplerenone (INSPRA) 25 MG tablet [id number] Yes TAKE 1 TABLET (25 MG TOTAL) BY MOUTH DAILY. Larey Dresser, MD Taking Active Self  FARXIGA 10 MG TABS tablet [id number] Yes TAKE 1 TABLET BY MOUTH DAILY BEFORE BREAKFAST. Larey Dresser, MD Taking Active   fenofibrate (TRICOR) 145 MG tablet [id number] Yes TAKE 1 TABLET BY MOUTH EVERY DAY Larey Dresser, MD Taking Active   furosemide (LASIX) 40 MG tablet [id number] Yes Take 2 tablets (80 mg total) by mouth 2 (two) times daily. Larey Dresser, MD Taking Active Self  HUMALOG MIX 75/25 KWIKPEN (75-25) 100 UNIT/ML KwikPen [id number] Yes Inject 30-40 Units into the skin See admin instructions. Take 40 units in the AM, then take 30 units in the afternoon per patient [provider] Taking Active Self  icosapent Ethyl (VASCEPA) 1 g capsule [id number] Yes TAKE 2 CAPSULES BY MOUTH TWICE A DAY Larey Dresser, MD Taking Active   LIVALO 4 MG TABS [id number] Yes Take 1 tablet by mouth daily. [provider] Taking Active Self  Multiple Vitamin (MULTIVITAMIN WITH MINERALS) TABS tablet [id number] Yes Take 1 tablet by mouth daily. [provider] Taking Active Self  potassium chloride (KLOR-CON M10) 10 MEQ tablet [id number] Yes Take 2 tablets (20 mEq total) by mouth 2 (two) times daily. Lyda Jester M, PA-C Taking Active Self  sacubitril-valsartan (ENTRESTO) 49-51 MG [id number] Yes Take 1 tablet by mouth 2 (two) times daily. Larey Dresser, MD Taking Active Self            Patient Active Problem List   Diagnosis Date Noted   COVID-19 04/15/2021   Respiratory failure with hypoxia ([place]) 09/73/5329   Systolic CHF ([place]) 92/42/6834   CKD (chronic kidney disease) stage 3, GFR 30-59 ml/min ([place]) 04/15/2021   Elevated troponin 04/15/2021   Acute on chronic systolic (congestive) heart failure ([place]) 09/14/2020   Wide-complex tachycardia 09/07/2020   Ventricular tachyarrhythmia  09/07/2020   VT (ventricular tachycardia) 09/07/2020   Persistent atrial fibrillation ([place]) 04/30/2018   Wound of left leg 10/01/2017   Cellulitis of right lower extremity 11/27/2016   Encounter for assessment for deep vein thrombosis (DVT) 11/14/2016   Hypokalemia 08/13/2016   Constipation 08/13/2016   Type 2 diabetes mellitus with hyperglycemia, with long-term current use of  insulin ([place])    Atherosclerosis of aorta ([place]) 06/26/2016   Tobacco abuse 06/26/2016   Colitis 11/24/2014   Severe obesity (BMI >= 40) (H[facility]) 11/12/2013   PAF (paroxysmal atrial fibrillation) ([place]) 11/03/2013   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 10/28/2013   COLONIC POLYPS 08/16/2010   PAD (peripheral artery disease) ([place]) 08/15/2010   HLD (hyperlipidemia) 11/18/2009   MYALGIA 11/18/2009   OBESITY 05/14/2007   PULMONARY NODULE, RIGHT MIDDLE LOBE 05/14/2007   Obstructive sleep apnea 12/06/2006   Essential hypertension 12/06/2006   COPD (chronic obstructive pulmonary disease) ([place]) 12/06/2006    Immunization History  Administered Date(s) Administered   Influenza Whole 04/28/2009, 04/27/2010   Influenza, High Dose Seasonal PF 04/24/2014   Influenza-Unspecified 04/17/2015   Pneumococcal Conjugate-13 10/11/2015   Pneumococcal Polysaccharide-23 07/17/2002, 11/29/2011   Td 07/17/2002, 06/28/2018   Zoster, Live 11/11/2008    Conditions to be addressed/monitored: Atrial Fibrillation, CHF, CAD, HTN, HLD, Hypertriglyceridemia, COPD, and DMII  Care Plan : General Pharmacy (Adult)  Updates made by Cherre Stark, RPH-CPP since 06/20/2021 12:00 AM     Problem: HTN; CHF; atrial fibrillation, Hyperlipidemia; type 2 DM, insulin dependent;   Priority: High  Onset Date: 05/11/2021     Long-Range Goal: Provide education, support and care coordination for medication therapy and chronic conditions   Start Date: 05/11/2021  Expected End Date: 05/11/2022  Priority: High  Note:    Current Barriers:  Unable to independently afford treatment regimen Unable to achieve control of type 2 diabetes  Unable to maintain control of hypertension and hyperlipidemia   Pharmacist Clinical Goal(s):  Over the next 180 days, patient will verbalize ability to afford treatment regimen achieve control of type 2 diabetes as evidenced by A1c <8.0% (to start, will adjust goal as needed) maintain control of hypertension and hyperlipidemia  as evidenced by blood pressure <140/90 and LDL <70  through collaboration with PharmD and provider.   Interventions: 1:1 collaboration with Carollee Stark, [name], DO regarding development and update of comprehensive plan of care as evidenced by provider attestation and co-signature Inter-disciplinary care team collaboration (see longitudinal plan of care) Comprehensive medication review performed; medication list updated in electronic medical record  Diabetes: Uncontrolled; Initial A1c goal <8.0% - will try to gest <7.5% if able without increase in hypoglycemia Managed by endocrinologist - Dr Heidi Stark Current treatment: Humalog 75/25 Mix - inject 40 units each morning and 30 units each evening Farxiga 54m daily before breakfast Ozempic 0.2470minjected subcutaneously once weekly for 4 weeks, then increase to 0.70m43meekly (started 06/18/2021 by Dr McLAundra Dubinr diabetes and weight loss)  Past treatments: Invokamet; Lantus Current glucose readings: Patient states since starting Ozempic blood glucose has been 161 and 140. Previously was in 140's to low 200's Denies hypoglycemic/hyperglycemic symptoms At November 2022 appointment provided patient assistance program applications for Lily (Humalog) and [place] visit. She is out of coverage gap for 2022 but entered in March 2022. Patient assistance program applications will be for 2023.  Reminded patient to complete patient assistance program applications. Interventions:  Reviewed home blood  glucose readings and reviewed goals  Fasting blood glucose goal (before meals) = 80 to 130 Blood glucose goal after a meal = less than 180  Recommended follow up with Dr BalChalmers Catereminded patient to complete and return applications for patient assistance program for Humalog, Ozempic, Farxiga, Livalo and [place] Hypertension / CHF: Controlled CHF  managed by Dr McLAundra Stark  with [facility] Current treatment: Farxiga 77m daily  Entresto 49-526m- take 1 tablet twice a day Inspra / Eplerenone 2529mnce a day Furosemide 12m71mtake 2 tablets = 80mg21mce a day Potassium 10 mEq - take 2 tablets twice a day  Metoprolol ER 25mg 47my (Restarted 05/30/2021 - in past had hypotension with beta blockers) Current home readings: Patient could not recall exact numbers and did not have record but states blood pressure never >140/90; Also states blood pressure has not been too low since metoprolol restarted (lowest 120/64) Last BNP = 61.7 (04/23/2021); previous BNP was 106 (12/09/2020) EF = 55% (12/2017) Interventions:  Reviewed blood pressure and medications Screened for patient assistance. Not needed for 2022 but reached coverage gap by March 2022. Provided medication assistance program applications for [place] at previous visit. Reminded patient to complete and return to office.  Continue to follow up with Heart Failure Clinic  Hyperlipidemia: Uncontrolled; LDL goal <70 Current treatment: Livalo 4mg da47m  Fenofibrate 145mg da65mVascepa / icosapent ethyl 1 gram - take 2 capsules twice a day Medications previously tried: pravastatin and rosuvastatin  Interventions:  Discussed LDL goals Provided medication assistance program application for 2023 for6153alo at previous visit. Reminded patient to return to office. Continue current medications for lipid lowering. Recommend checking lipids with next labs.  Dicussed limiting saturated and trans fat in diet.    Atrial Fibrillation: Controlled;  current rate/rhythm control Amiodarone 100mg dai32m takeing 0.5 tablet of 200mg dail82mose lowered 05/2021 per Dr McLean) anAundra Dubinulant treatment: Eliquis 5mg twice 22may CHADS2VASc score: 6 TSH was slightly elevated when checked 05/30/2021 but T4 and T3 were WNL. LFTS were WNL Patient reports she has had eye exam.  Intervention:  Reminded patient to monitor for signs and symptoms of bleeding Continue to monitor LFTs and thyroid function and get annual eye exam while taking amiodarone Will continue to follow in 2023 and provide medication assistance program application for Eliquis (cannot apply until reaches coverage gap)  Health Maintenance:  Reviewed vaccination history and discussed benefits of COVID, influenza, Shingrix vaccines Patient declines to get COVID, influenza or Shingrix vaccines at this time Patient states she has had diabetic eye exam this year. Contacted Dr McCuen's ofBenna Dunks exam was 09/01/2020. Requested [place] [facility] to fax copy of report to our office.   Medication management Pharmacist Clinical Goal(s): Over the next 90 days, patient will work with PharmD and providers to maintain optimal medication adherence Current pharmacy: [facility] Comprehensive medication review performed. Reviewed refill history and assessed adherence Continue current medication management strategy  Patient Goals/Self-Care Activities Over the next 180 days, patient will:  take medications as prescribed, check blood pressure 3 to 4 times per week, document, and provide at future appointments, weigh daily, and contact provider if weight gain of more than 3 lbs in 24 hours or 5 lbs in 1 week, and collaborate with provider on medication access solutions  Follow Up Plan: Telephone follow up appointment with care management team member scheduled for:  1 to 2 months to check medication assistance program process for 2023.          Medication Assistance:  None needed for 2022 but she reached coverage gap March 2022 and was out by June 2022. Spent a lot of money in 2022 on medications. Mailed application for 2023 to try7943avoid coverage  gap / decrease medication costs for 2023.   Patient's preferred pharmacy is:  [id number]: [phone]: [phone] [id number] [place] [place]: [phone]: [phone] Follow Up:  Patient agrees to Care Plan and Follow-up.  Plan: Telephone follow up appointment with care management team member scheduled for:  1 to 2 months  TaCherre RobinsPharmD Clinical Pharmacist [facility]

## 2021-06-20 NOTE — Patient Instructions (Signed)
Visit Information Heidi Stark  It was a pleasure speaking with you today.  I have attached a summary of our visit today and information about your health goals.   Our next appointment is by telephone on July 21, 2021 at 10:15am  Please call the care guide team at [phone] if you need to cancel or reschedule your appointment.   If you have any questions or concerns, please feel free to contact me either at the phone number below or with a MyChart message.   Keep up the good work!  Heidi Stark, PharmD Clinical Pharmacist [place] [phone] (direct line)  [phone] (main office number)   Patient verbalizes understanding of instructions provided today and agrees to view in [place].    PATIENT GOALS:  Goals Addressed             This [place] for Chronic Care Management       Current Barriers:  Unable to independently afford treatment regimen Unable to achieve control of type 2 diabetes  Unable to maintain control of hypertension and hyperlipidemia   Diabetes: Uncontrolled; Initial A1c goal <8.0% - will try to get <7.5% if able without increase in hypoglycemia  Lab Results  Component Value Date   HGBA1C 9.0 (H) 04/15/2021   Managed by endocrinologist - Dr [name] Current treatment: Humalog 75/25 Mix - inject 40 units each morning and 30 units each evening Farxiga 10mg  daily before breakfast Ozempic 0.25mg  inject weekly for 4 weeks, then increase to 0.5mg  weekly Interventions:  Reviewed home blood glucose readings and reviewed goals  Fasting blood glucose goal (before meals) = 80 to 130 Blood glucose goal after a meal = less than 180  Follow up with Dr [name] for adjustment in insulin therpay Provided patient assistance program applications for Lily (Humalog) and [place] and [place] for [name] - she is out of coverage gap for 2022 but entered in March 2022. Patient assistance program applications will  be for 2023.   Hypertension / CHF: BP Readings from Last 3 Encounters:  04/23/21 (!) 105/54  01/27/21 118/76  12/20/20 126/64   Current treatment: [name] 10mg  daily  Entresto 49-51mg  - take 1 tablet twice a day Inspra / Eplerenone 25mg  once a day Furosemide 40mg  - take 2 tablets = 80mg  twice a day Potassium 10 mEq - take 2 tablets twice a day  Metoprolol ER 25mg  daily  Interventions:  Reviewed blood pressure and medications Screened for patient assistance. Not needed for 2022 but reached coverage gap by March 2022. Provided medication assistance program applications for Horton Chin for 2023.  Continue to follow up with [facility] Failure Clinic  Hyperlipidemia: Uncontrolled; LDL goal <70  Lab Results  Component Value Date   CHOL 186 01/27/2021   HDL 34 (L) 01/27/2021   LDLCALC 94 01/27/2021   LDLDIRECT 42.0 11/30/2014   TRIG 292 (H) 01/27/2021   CHOLHDL 5.5 01/27/2021   Current treatment: Livalo 4mg  daily  Fenofibrate 145mg  daily Vascepa / icosapent ethyl 1 gram - take 2 capsules twice a day Interventions:  Discussed LDL goals Provided medication assistance program application for 6789 for Livalo Continue current medications for lipid lowering. Recommend checking lipids with next labs.  Dicussed limiting saturated and trans fat in diet.   Atrial Fibrillation: current rate/rhythm control Amiodarone 100mg  (taking 0.5 tablet of 200mg ) daily (dose lowered 05/30/2021) anticoagulant treatment: Eliquis 5mg  twice a day Intervention:  Reminded patient to monitor for signs and  symptoms of bleeding Monitor liver function and thyroid function and get annual eye exam while taking amiodarone Will continue to follow in 2023 and provide medication assistance program application for Eliquis (cannot apply until reaches coverage gap)  Medication management Pharmacist Clinical Goal(s): Over the next 90 days, patient will work with PharmD and providers to maintain optimal  medication adherence Current pharmacy: [facility] Comprehensive medication review performed. Reviewed refill history and assessed adherence Continue current medication management strategy Patient was not aware the antibiotic levofloxacin has been called into her pharmacy 2 days ago. Made her aware and she will pick up today.  Patient Goals/Self-Care Activities Over the next 180 days, patient will:  take medications as prescribed, check blood pressure 3 to 4 times per week, document, and provide at future appointments, weigh daily, and contact provider if weight gain of more than 3 lbs in 24 hours or 5 lbs in 1 week, and collaborate with provider on medication access solutions  Follow Up Plan: Telephone follow up appointment with care management team member scheduled for:  1 to 2 months to check medication assistance program process for 2023 and home blood glucose readings.         Patient verbalizes understanding of instructions provided today and agrees to view in [place].   Telephone follow up appointment with care management team member scheduled for: 1 to 2 months  Heidi Stark, PharmD Clinical Pharmacist [facility]

## 2021-06-21 ENCOUNTER — Encounter: Payer: Self-pay | Admitting: Family Medicine

## 2021-06-23 ENCOUNTER — Ambulatory Visit: Payer: Medicare Other

## 2021-06-23 ENCOUNTER — Telehealth: Payer: Self-pay | Admitting: Pharmacist

## 2021-06-23 DIAGNOSIS — I5020 Unspecified systolic (congestive) heart failure: Secondary | ICD-10-CM

## 2021-06-23 DIAGNOSIS — E1165 Type 2 diabetes mellitus with hyperglycemia: Secondary | ICD-10-CM

## 2021-06-23 NOTE — Patient Instructions (Signed)
Visit Information  Thank you for taking time to visit with me today. Please don't hesitate to contact me if I can be of assistance to you before our next scheduled telephone appointment.  Following are the goals we discussed today:  Patient Goals/Self-Care Activities: Patient will self administer medications as prescribed Patient will attend all scheduled provider appointments Patient will call provider office for new concerns or questions Continue exercise provided by home health physical therapist Check your blood sugars as recommended by your provider, record and take to your provider visits. Plan to eat healthy: : eat plenty of vegetables, fruits, lean meats; monitor your fat intake; limit: sugars, rice, potatoes, pasta. Monitor salt intake Review education on atrial fibrillation and plan to discuss at next visit.  Our next appointment is by telephone on 07/25/20 at 10:45 am  Please call the care guide team at [phone] if you need to cancel or reschedule your appointment.   If you are experiencing a Mental Health or [place] or need someone to talk to, please call the Suicide and Crisis Lifeline: 988 call 1-800-273-TALK (toll free, 24 hour hotline)   Patient verbalizes understanding of instructions provided today and agrees to view in [place].   Thea Silversmith, RN, MSN, BSN, CCM Care Management Coordinator [facility] [phone]     Atrial Fibrillation Atrial fibrillation is a type of heartbeat that is irregular or fast. If you have this condition, your heart beats without any order. This makes it hard for your heart to pump blood in a normal way. Atrial fibrillation may come and go, or it may become a long-lasting problem. If this condition is not treated, it can put you at higher risk for stroke, heart failure, and other heart problems. What are the causes? This condition may be caused by diseases that damage the heart. They include: High  blood pressure. Heart failure. Heart valve disease. Heart surgery. Other causes include: Diabetes. Thyroid disease. Being overweight. Kidney disease. Sometimes the cause is not known. What increases the risk? You are more likely to develop this condition if: You are older. You smoke. You exercise often and very hard. You have a family history of this condition. You are a man. You use drugs. You drink a lot of alcohol. You have lung conditions, such as emphysema, pneumonia, or COPD. You have sleep apnea. What are the signs or symptoms? Common symptoms of this condition include: A feeling that your heart is beating very fast. Chest pain or discomfort. Feeling short of breath. Suddenly feeling light-headed or weak. Getting tired easily during activity. Fainting. Sweating. In some cases, there are no symptoms. How is this treated? Treatment for this condition depends on underlying conditions and how you feel when you have atrial fibrillation. They include: Medicines to: Prevent blood clots. Treat heart rate or heart rhythm problems. Using devices, such as a pacemaker, to correct heart rhythm problems. Doing surgery to remove the part of the heart that sends bad signals. Closing an area where clots can form in the heart (left atrial appendage). In some cases, your doctor will treat other underlying conditions. Follow these instructions at home: Medicines Take over-the-counter and prescription medicines only as told by your doctor. Do not take any new medicines without first talking to your doctor. If you are taking blood thinners: Talk with your doctor before you take any medicines that have aspirin or NSAIDs, such as ibuprofen, in them. Take your medicine exactly as told by your doctor. Take it at  the same time each day. Avoid activities that could hurt or bruise you. Follow instructions about how to prevent falls. Wear a bracelet that says you are taking blood thinners.  Or, carry a card that lists what medicines you take. Lifestyle   Do not use any products that have nicotine or tobacco in them. These include cigarettes, e-cigarettes, and chewing tobacco. If you need help quitting, ask your doctor. Eat heart-healthy foods. Talk with your doctor about the right eating plan for you. Exercise regularly as told by your doctor. Do not drink alcohol. Lose weight if you are overweight. Do not use drugs, including cannabis. General instructions If you have a condition that causes breathing to stop for a short period of time (apnea), treat it as told by your doctor. Keep a healthy weight. Do not use diet pills unless your doctor says they are safe for you. Diet pills may make heart problems worse. Keep all follow-up visits as told by your doctor. This is important. Contact a doctor if: You notice a change in the speed, rhythm, or strength of your heartbeat. You are taking a blood-thinning medicine and you get more bruising. You get tired more easily when you move or exercise. You have a sudden change in weight. Get help right away if:  You have pain in your chest or your belly (abdomen). You have trouble breathing. You have side effects of blood thinners, such as blood in your vomit, poop (stool), or pee (urine), or bleeding that cannot stop. You have any signs of a stroke. "BE FAST" is an easy way to remember the main warning signs: B - Balance. Signs are dizziness, sudden trouble walking, or loss of balance. E - Eyes. Signs are trouble seeing or a change in how you see. F - Face. Signs are sudden weakness or loss of feeling in the face, or the face or eyelid drooping on one side. A - Arms. Signs are weakness or loss of feeling in an arm. This happens suddenly and usually on one side of the body. S - Speech. Signs are sudden trouble speaking, slurred speech, or trouble understanding what people say. T - Time. Time to call emergency services. Write down what  time symptoms started. You have other signs of a stroke, such as: A sudden, very bad headache with no known cause. Feeling like you may vomit (nausea). Vomiting. A seizure. These symptoms may be an emergency. Do not wait to see if the symptoms will go away. Get medical help right away. Call your local emergency services (911 in the [place] Do not drive yourself to the hospital. Summary Atrial fibrillation is a type of heartbeat that is irregular or fast. You are at higher risk of this condition if you smoke, are older, have diabetes, or are overweight. Follow your doctor's instructions about medicines, diet, exercise, and follow-up visits. Get help right away if you have signs or symptoms of a stroke. Get help right away if you cannot catch your breath, or you have chest pain or discomfort. This information is not intended to replace advice given to you by your health care provider. Make sure you discuss any questions you have with your health care provider. Document Revised: 12/25/2018 Document Reviewed: 12/25/2018 Elsevier Patient Education  [place].

## 2021-06-23 NOTE — Chronic Care Management (AMB) (Signed)
Chronic Care Management   CCM RN Visit Note  06/23/2021 Name: Heidi Stark MRN: [id number] DOB: 1941-07-02  Subjective: Heidi Stark is a 80 y.o. year old female who is a primary care patient of Heidi Held, DO. The care management team was consulted for assistance with disease management and care coordination needs.    Engaged with patient by telephone for follow up visit in response to provider referral for case management and/or care coordination services.   Consent to Services:  The patient was given information about Chronic Care Management services, agreed to services, and gave verbal consent prior to initiation of services.  Please see initial visit note for detailed documentation.   Patient agreed to services and verbal consent obtained.   Assessment: Review of patient past medical history, allergies, medications, health status, including review of consultants reports, laboratory and other test data, was performed as part of comprehensive evaluation and provision of chronic care management services.   SDOH (Social Determinants of Health) assessments and interventions performed:    CCM Care Plan  Allergies  Allergen Reactions   Neomycin-Bacitracin Zn-Polymyx Rash   Sulfonamide Derivatives Other (See Comments)    Stomach cramps   Ciprofloxacin Hives, Itching and Rash    Pt doesn't remember having any reaction to cipro   Spironolactone Rash    Outpatient Encounter Medications as of 06/23/2021  Medication Sig   acetaminophen (TYLENOL) 500 MG tablet Take 500 mg by mouth every 6 (six) hours as needed for moderate pain or headache.   albuterol (VENTOLIN HFA) 108 (90 Base) MCG/ACT inhaler Inhale 2 puffs into the lungs every 4 (four) hours as needed for wheezing or shortness of breath.   amiodarone (PACERONE) 200 MG tablet Take 0.5 tablets (100 mg total) by mouth daily.   apixaban (ELIQUIS) 5 MG TABS tablet Take 1 tablet (5 mg total) by mouth 2 (two) times daily.    cyclobenzaprine (FLEXERIL) 10 MG tablet TAKE 1 TABLET BY MOUTH THREE TIMES A DAY AS NEEDED FOR MUSCLE SPASMS   eplerenone (INSPRA) 25 MG tablet TAKE 1 TABLET (25 MG TOTAL) BY MOUTH DAILY.   FARXIGA 10 MG TABS tablet TAKE 1 TABLET BY MOUTH DAILY BEFORE BREAKFAST.   fenofibrate (TRICOR) 145 MG tablet TAKE 1 TABLET BY MOUTH EVERY DAY   furosemide (LASIX) 40 MG tablet Take 2 tablets (80 mg total) by mouth 2 (two) times daily.   HUMALOG MIX 75/25 KWIKPEN (75-25) 100 UNIT/ML KwikPen Inject 30-40 Units into the skin See admin instructions. Take 40 units in the AM, then take 30 units in the afternoon per patient   icosapent Ethyl (VASCEPA) 1 g capsule TAKE 2 CAPSULES BY MOUTH TWICE A DAY   LIVALO 4 MG TABS TAKE 1 TABLET BY MOUTH EVERY DAY   metoprolol succinate (TOPROL XL) 25 MG 24 hr tablet Take 1 tablet (25 mg total) by mouth daily.   Multiple Vitamin (MULTIVITAMIN WITH MINERALS) TABS tablet Take 1 tablet by mouth daily.   potassium chloride (KLOR-CON M10) 10 MEQ tablet Take 2 tablets (20 mEq total) by mouth 2 (two) times daily.   sacubitril-valsartan (ENTRESTO) 49-51 MG Take 1 tablet by mouth 2 (two) times daily.   Semaglutide,0.25 or 0.5MG /DOS, (OZEMPIC, 0.25 OR 0.5 MG/DOSE,) 2 MG/1.5ML SOPN Inject 0.25 mg into the skin once a week.   No facility-administered encounter medications on file as of 06/23/2021.    Patient Active Problem List   Diagnosis Date Noted   COVID-19 04/15/2021   Respiratory failure with  hypoxia ([place]) 05/69/7948   Systolic CHF ([place]) 01/65/5374   CKD (chronic kidney disease) stage 3, GFR 30-59 ml/min ([facility]) 04/15/2021   Elevated troponin 04/15/2021   Acute on chronic systolic (congestive) heart failure ([place]) 09/14/2020   Wide-complex tachycardia 09/07/2020   Ventricular tachyarrhythmia 09/07/2020   VT (ventricular tachycardia) 09/07/2020   Persistent atrial fibrillation ([place]) 04/30/2018   Wound of left leg 10/01/2017   Cellulitis of right lower extremity 11/27/2016    Encounter for assessment for deep vein thrombosis (DVT) 11/14/2016   Hypokalemia 08/13/2016   Constipation 08/13/2016   Type 2 diabetes mellitus with hyperglycemia, with long-term current use of insulin ([place])    Atherosclerosis of aorta ([place]) 06/26/2016   Tobacco abuse 06/26/2016   Colitis 11/24/2014   Severe obesity (BMI >= 40) ([place]) 11/12/2013   PAF (paroxysmal atrial fibrillation) ([place]) 11/03/2013   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 10/28/2013   COLONIC POLYPS 08/16/2010   PAD (peripheral artery disease) ([place]) 08/15/2010   HLD (hyperlipidemia) 11/18/2009   MYALGIA 11/18/2009   OBESITY 05/14/2007   PULMONARY NODULE, RIGHT MIDDLE LOBE 05/14/2007   Obstructive sleep apnea 12/06/2006   Essential hypertension 12/06/2006   COPD (chronic obstructive pulmonary disease) ([place]) 12/06/2006    Conditions to be addressed/monitored:Atrial Fibrillation, CHF, HTN, DMII, and chronic low back pain  Care Plan : RN Care Manager Plan of Care  Updates made by Heidi Rued, RN since 06/23/2021 12:00 AM     Problem: No plan of Care Established for Management of Chronic Disease (DM, COPD, CHF, Chronic midline low back Pain)   Priority: High     Long-Range Goal: Development of Plan of Care for Chronic Disease Managment (DM, COPD, CHF, Chronic midline low back Pain)   Start Date: 05/12/2021  Expected End Date: 11/10/2021  Priority: High  Note:   Current Barriers: admit 04/15/21 - 04/23/21 with COVID/PNA/acute on chronic CHF in a patient with history of COPD. DM- Hemoglobin A1C 9 on 04/15/21. Heidi Stark states blood sugar today was 151 AC. She denies any signs/symptoms of hypoglycemia and hyperglycemia. She denies any signs/symptoms of CHF. She reports she has an appointment tomorrow with cardiology to follow up on atrial fibrillation on 05/30/21 no AF on device interrogation. Last home health physical therapy visit was this week and she reports improved mobility and  reports no pain at this time.   She denies any concerns at this time.  Chronic Disease Management support and education needs related to Atrial Fibrillation, CHF, COPD, DMII, and chronic low back pain   RNCM Clinical Goal(s):  Patient will verbalize understanding of plan for management of Atrial Fibrillation, CHF, COPD, DMII, and chronic low back pain as evidenced by self report and/or chart notation take all medications exactly as prescribed and will call provider for medication related questions as evidenced by self report and/or chart notation demonstrate ongoing self health care management ability   as evidenced by attending provider visits as scheduled, taking medications as prescribed, contacting provider with questions and concerns as needed  through collaboration with RN Care manager, provider, and care team.   Interventions: 1:1 collaboration with primary care provider regarding development and update of comprehensive plan of care as evidenced by provider attestation and co-signature Inter-disciplinary care team collaboration (see longitudinal plan of care) Evaluation of current treatment plan related to  self management and patient's adherence to plan as established by provider  AFIB Interventions: (Status:  New goal.) Long Term Goal   Medications reviewed,  reiterated the importance of taking medications as prescribed. Confirmed patient has transportation to appointment 06/24/21  Provided education on atrial fibrillation  Pain Interventions: Long term: goal met- zero pain  Pain assessment performed Medications reviewed and encouraged to take medications as prescribed. Discussed importance of adherence to all scheduled medical appointments Reviewed with patient prescribed pharmacological and nonpharmacological pain relief strategies Discussed importance of continuing exercises recommended by physical therapist to maintain muscle strength and tone and pain relief.  Hypertension  Interventions: Long term: Goal on track At home BP check 110/70 by HHPT this week BP Readings from Last 3 Encounters:  06/17/21 110/70  05/30/21 104/70  04/23/21 (!) 105/54  Most recent eGFR/CrCl: No results found for: EGFR  No components found for: CRCL  Encouraged patient to check and record blood sugar 1-2 times/week. Reviewed upcoming provider appointments Encouraged to continue to take medications as prescribed Provided positive feedback regarding self health management  Diabetes Interventions: Long Term: goal on track Assessed patient's understanding of A1c goal: <8% Reviewed medications with patient and discussed importance of medication adherence Discussed plans with patient for ongoing care management follow up and provided patient with direct contact information for care management team Reviewed A1C goal; Fasting BS goal 80-130 and <180 after meals. Reviewed signs/symptoms of hypoglycemia and hyperglycemia Encouraged to eat healthy-low sodium, carb modified and discussed portion management. Lab Results  Component Value Date   HGBA1C 9.0 (H) 04/15/2021  COPD Interventions: Long Term: goal on track  Medications reviewed with patient and encouraged to take as prescribed. Encouraged to attend provider visits as scheduled.  Heart Failure Interventions: Long Term: goal on track Reviewed signs/symptoms of HF exacerbation Discussed importance of daily weights and when to call the doctor  Patient Goals/Self-Care Activities: Patient will self administer medications as prescribed Patient will attend all scheduled provider appointments Patient will call provider office for new concerns or questions Continue exercise provided by home health physical therapist Check your blood sugars as recommended by your provider, record and take to your provider visits. Plan to eat healthy: : eat plenty of vegetables, fruits, lean meats; monitor your fat intake; limit: sugars, rice, potatoes,  pasta. Monitor salt intake Review education on atrial fibrillation and plan to discuss at next visit.    Plan:Telephone follow up appointment with care management team member scheduled for:  next month The patient has been provided with contact information for the care management team and has been advised to call with any health related questions or concerns.    Thea Silversmith, RN, MSN, BSN, CCM Care Management Coordinator [facility] [phone]

## 2021-06-23 NOTE — Progress Notes (Signed)
Electrophysiology Office Note Date: 06/23/2021  ID:  Heidi, Stark 1940/10/25, MRN [id number]  PCP: [name], [name], DO Primary Cardiologist: None Electrophysiologist: [name], MD   CC: Routine ICD follow-up  Heidi Stark is a 80 y.o. female seen today for [name], MD for routine electrophysiology followup.  Since last being seen in our clinic the patient reports doing well overall. She had 2 episodes of NSVT .  she denies chest pain, palpitations, dyspnea, PND, orthopnea, nausea, vomiting, dizziness, syncope, edema, weight gain, or early satiety. She has not had ICD shocks.   Device History: Medtronic Single Chamber ICD implanted 09/2020 for CHF History of AAD therapy: Yes; currently on amiodarone    Past Medical History:  Diagnosis Date   A-fib ([place])    Arthritis    "hands, wrists" (11/07/2016)   CHF (congestive heart failure) (H[facility])    Chronic lower back pain    Constipation    COPD (chronic obstructive pulmonary disease) ([place])    DVT (deep venous thrombosis) (H[facility])    Dyspnea    History of blood transfusion 1990   "related to OR"   History of kidney stones    Hyperlipidemia    Hypertension    Joint pain    Lower extremity edema    OSA on CPAP    Persistent atrial fibrillation ([place])    Pneumonia    "several times" (11/07/2016)   Type II diabetes mellitus ([place])    Past Surgical History:  Procedure Laterality Date   ATRIAL FIBRILLATION ABLATION N/A 11/07/2016   Procedure: Atrial Fibrillation Ablation;  Surgeon: [name], MD;  Location: [facility];  Service: Cardiovascular;  Laterality: N/A;   BACK SURGERY     CARDIAC CATHETERIZATION     CARDIOVERSION N/A 08/11/2016   Procedure: CARDIOVERSION;  Surgeon: Larey Dresser, MD;  Location: [place];  Service: Cardiovascular;  Laterality: N/A;   CARDIOVERSION N/A 12/03/2017   Procedure: CARDIOVERSION;  Surgeon: Larey Dresser, MD;  Location: [facility] END[facility];  Service:  Cardiovascular;  Laterality: N/A;   CARDIOVERSION N/A 05/02/2018   Procedure: CARDIOVERSION;  Surgeon: Pixie Casino, MD;  Location: [facility] ENDOSCOPY;  Service: Cardiovascular;  Laterality: N/A;   CARDIOVERSION N/A 06/23/2020   Procedure: CARDIOVERSION;  Surgeon: [name], MD;  Location: [facility] ENDOSCOPY;  Service: Cardiovascular;  Laterality: N/A;   CARDIOVERSION N/A 10/11/2020   Procedure: CARDIOVERSION;  Surgeon: Larey Dresser, MD;  Location: [place];  Service: Cardiovascular;  Laterality: N/A;   CARPAL TUNNEL RELEASE     CATARACT EXTRACTION W/ INTRAOCULAR LENS  IMPLANT, BILATERAL Bilateral    COLON SURGERY  1990   vein graft and colon repair after nicked artery with back surgert   COLONOSCOPY     CORONARY ANGIOGRAPHY N/A 09/08/2020   Procedure: CORONARY ANGIOGRAPHY;  Surgeon: Larey Dresser, MD;  Location: [place] [facility];  Service: Cardiovascular;  Laterality: N/A;   CYSTECTOMY     between bladder and kidneys   GANGLION CYST EXCISION Left    ICD IMPLANT N/A 09/15/2020   Procedure: ICD IMPLANT;  Surgeon: Constance Haw, MD;  Location: [place];  Service: Cardiovascular;  Laterality: N/A;   KNEE CARTILAGE SURGERY Right 1960s   LUMBAR [place] ILIAC ARTERY  1990   vein graft and colon repair after nicked artery with back surgert   TEE WITHOUT CARDIOVERSION N/A 10/31/2013   Procedure: TRANSESOPHAGEAL ECHOCARDIOGRAM (TEE);  Surgeon: Larey Dresser, MD;  Location: [facility] END[facility]COPY;  Service: Cardiovascular;  Laterality: N/A;   TUBAL LIGATION      Current Outpatient Medications  Medication Sig Dispense Refill   acetaminophen (TYLENOL) 500 MG tablet Take 500 mg by mouth every 6 (six) hours as needed for moderate pain or headache.     albuterol (VENTOLIN HFA) 108 (90 Base) MCG/ACT inhaler Inhale 2 puffs into the lungs every 4 (four) hours as needed for wheezing or shortness of breath. 8 g 0   amiodarone (PACERONE) 200 MG tablet Take 0.5  tablets (100 mg total) by mouth daily.     apixaban (ELIQUIS) 5 MG TABS tablet Take 1 tablet (5 mg total) by mouth 2 (two) times daily. 60 tablet 11   cyclobenzaprine (FLEXERIL) 10 MG tablet TAKE 1 TABLET BY MOUTH THREE TIMES A DAY AS NEEDED FOR MUSCLE SPASMS 30 tablet 0   eplerenone (INSPRA) 25 MG tablet TAKE 1 TABLET (25 MG TOTAL) BY MOUTH DAILY. 90 tablet 1   FARXIGA 10 MG TABS tablet TAKE 1 TABLET BY MOUTH DAILY BEFORE BREAKFAST. 30 tablet 3   fenofibrate (TRICOR) 145 MG tablet TAKE 1 TABLET BY MOUTH EVERY DAY 90 tablet 3   furosemide (LASIX) 40 MG tablet Take 2 tablets (80 mg total) by mouth 2 (two) times daily. 270 tablet 3   HUMALOG MIX 75/25 KWIKPEN (75-25) 100 UNIT/ML KwikPen Inject 30-40 Units into the skin See admin instructions. Take 40 units in the AM, then take 30 units in the afternoon per patient     icosapent Ethyl (VASCEPA) 1 g capsule TAKE 2 CAPSULES BY MOUTH TWICE A DAY 120 capsule 5   LIVALO 4 MG TABS TAKE 1 TABLET BY MOUTH EVERY DAY 90 tablet 3   metoprolol succinate (TOPROL XL) 25 MG 24 hr tablet Take 1 tablet (25 mg total) by mouth daily. 30 tablet 6   Multiple Vitamin (MULTIVITAMIN WITH MINERALS) TABS tablet Take 1 tablet by mouth daily.     potassium chloride (KLOR-CON M10) 10 MEQ tablet Take 2 tablets (20 mEq total) by mouth 2 (two) times daily. 120 tablet 11   sacubitril-valsartan (ENTRESTO) 49-51 MG Take 1 tablet by mouth 2 (two) times daily. 60 tablet 3   Semaglutide,0.25 or 0.5MG /DOS, (OZEMPIC, 0.25 OR 0.5 MG/DOSE,) 2 MG/1.5ML SOPN Inject 0.25 mg into the skin once a week. 1.5 mL 0   No current facility-administered medications for this visit.    Allergies:   Neomycin-bacitracin zn-polymyx, Sulfonamide derivatives, Ciprofloxacin, and Spironolactone   Social History: Social History   Socioeconomic History   Marital status: Married    Spouse name: Charles   Number of children: 2   Years of education: Not on file   Highest education level: Not on file   Occupational History   Occupation: Retired  Tobacco Use   Smoking status: Former    Packs/day: 1.00    Years: 50.00    Pack years: 50.00    Types: Cigarettes    Quit date: 08/17/2020    Years since quitting: 0.8   Smokeless tobacco: Never  Vaping Use   Vaping Use: Never used  Substance and Sexual Activity   Alcohol use: No    Alcohol/week: 0.0 standard drinks   Drug use: No   Sexual activity: Not on file  Other Topics Concern   Not on file  Social History Narrative   Not on file   Social Determinants of Health   Financial Resource Strain: Medium Risk   Difficulty  of Paying Living Expenses: Somewhat hard  Food Insecurity: No Food Insecurity   Worried About Running Out of Food in the Last Year: Never true   Ran Out of Food in the Last Year: Never true  Transportation Needs: No Transportation Needs   Lack of Transportation (Medical): No   Lack of Transportation (Non-Medical): No  Physical Activity: Inactive   Days of Exercise per Week: 0 days   Minutes of Exercise per Session: 0 min  Stress: Not on file  Social Connections: Not on file  Intimate Partner Violence: Not on file    Family History: Family History  Problem Relation Age of Onset   Diabetes Mother    Hypertension Mother    Cancer Mother    Obesity Mother    Diabetes Father    Obesity Father    Diabetes Other    Melanoma Other    Factor V Leiden deficiency Daughter     Review of Systems: All other systems reviewed and are otherwise negative except as noted above.   Physical Exam: There were no vitals filed for this visit.   GEN- The patient is well appearing, alert and oriented x 3 today.   HEENT: normocephalic, atraumatic; sclera clear, conjunctiva pink; hearing intact; oropharynx clear; neck supple, no JVP Lymph- no cervical lymphadenopathy Lungs- Clear to ausculation bilaterally, normal work of breathing.  No wheezes, rales, rhonchi Heart- Regular rate and rhythm, no murmurs, rubs or gallops,  PMI not laterally displaced GI- soft, non-tender, non-distended, bowel sounds present, no hepatosplenomegaly Extremities- no clubbing or cyanosis. No edema; DP/PT/radial pulses 2+ bilaterally MS- no significant deformity or atrophy Skin- warm and dry, no rash or lesion; ICD pocket well healed Psych- euthymic mood, full affect Neuro- strength and sensation are intact  ICD interrogation- reviewed in detail today,  See PACEART report  EKG:  EKG is not ordered today.  Recent Labs: 04/23/2021: B Natriuretic Peptide 61.7; Hemoglobin 14.1; Magnesium 2.5; Platelets 240 05/30/2021: ALT 26; BUN 24; Creatinine, Ser 1.42; Potassium 4.6; Sodium 136; TSH 8.995   Wt Readings from Last 3 Encounters:  06/17/21 268 lb 12.8 oz (121.9 kg)  05/30/21 268 lb 3.2 oz (121.7 kg)  04/16/21 264 lb 15.9 oz (120.2 kg)     Other studies Reviewed: Additional studies/ records that were reviewed today include: Previous EP office notes.   Assessment and Plan:  1.  Chronic systolic dysfunction s/p Medtronic single chamber ICD  euvolemic today Stable on an appropriate medical regimen Normal ICD function See Pace Art report No changes today  2.  Paroxysmal atrial fibrillation:  Currently on Eliquis and amiodarone.   CHA2DS2-VASc of 6.  High risk medication monitoring.  Minimal episodes of atrial fibrillation since her amiodarone load.  We will continue with current management.   3.  Coronary artery disease: No s/s ischemia Calcium score in the 82nd percentile.  Continue Crestor.   4.  COPD:  Smoking cessation encouraged   5.  Obstructive sleep apnea:  Encouraged nightly CPAP    6.  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 Recently decreased to 100 mg daily.  She 2 asymptomatic NSVT episodes on 11/30 at [place]. One 7 and one 9 second run in her monitor zone  Increase amiodarone back to 200 mg daily as she has been intolerant to higher doses of BB Labs today.   Consider decreasing to 1000 mg  weekly if remains stable after 3-6 months.  Current medicines are reviewed at length with the patient today.    Labs/  tests ordered today include:  Orders Placed This Encounter  Procedures   Comprehensive metabolic panel   Magnesium     Disposition:   Follow up with Dr. [name] in 6 months    Signed, Shirley Friar, PA-C  06/23/2021 2:47 PM  [place] [address] [phone] (office) [phone] (fax)

## 2021-06-23 NOTE — Telephone Encounter (Signed)
Patient has question about [employer] patient assistance. She states it looks like household income is higher that the cut off for [employer] patient assistance program.  Discussed with patient and unfortunately she does not meet their 150% of nation poverty level or less requirement. Advised patient no need to complete [employer] application.  She will qualify for others that I sent her for [name], Humalog.  Patient will complete and send back to me.

## 2021-06-24 ENCOUNTER — Other Ambulatory Visit: Payer: Self-pay

## 2021-06-24 ENCOUNTER — Encounter: Payer: Self-pay | Admitting: Student

## 2021-06-24 ENCOUNTER — Ambulatory Visit: Payer: Medicare Other | Admitting: Student

## 2021-06-24 VITALS — BP 110/60 | HR 70 | Ht 66.0 in | Wt 254.0 lb

## 2021-06-24 DIAGNOSIS — I472 Ventricular tachycardia, unspecified: Secondary | ICD-10-CM | POA: Diagnosis not present

## 2021-06-24 DIAGNOSIS — I480 Paroxysmal atrial fibrillation: Secondary | ICD-10-CM

## 2021-06-24 DIAGNOSIS — I509 Heart failure, unspecified: Secondary | ICD-10-CM | POA: Diagnosis not present

## 2021-06-24 DIAGNOSIS — I5022 Chronic systolic (congestive) heart failure: Secondary | ICD-10-CM

## 2021-06-24 LAB — CUP PACEART INCLINIC DEVICE CHECK
Battery Remaining Longevity: 132 mo
Battery Voltage: 3.03 V
Brady Statistic RV Percent Paced: 0.04 %
Date Time Interrogation Session: 20221209111913
HighPow Impedance: 104 Ohm
Implantable Lead Implant Date: 20220302
Implantable Lead Location: 753860
Implantable Pulse Generator Implant Date: 20220302
Lead Channel Impedance Value: 627 Ohm
Lead Channel Impedance Value: 760 Ohm
Lead Channel Pacing Threshold Amplitude: 2.5 V
Lead Channel Pacing Threshold Pulse Width: 0.4 ms
Lead Channel Sensing Intrinsic Amplitude: 12.5 mV
Lead Channel Sensing Intrinsic Amplitude: 13.375 mV
Lead Channel Setting Pacing Amplitude: 5 V
Lead Channel Setting Pacing Pulse Width: 1 ms
Lead Channel Setting Sensing Sensitivity: 0.3 mV

## 2021-06-24 LAB — COMPREHENSIVE METABOLIC PANEL
ALT: 20 IU/L (ref 0–32)
AST: 39 IU/L (ref 0–40)
Albumin/Globulin Ratio: 1.4 (ref 1.2–2.2)
Albumin: 4.3 g/dL (ref 3.7–4.7)
Alkaline Phosphatase: 81 IU/L (ref 44–121)
BUN/Creatinine Ratio: 21 (ref 12–28)
BUN: 28 mg/dL — ABNORMAL HIGH (ref 8–27)
Bilirubin Total: 0.3 mg/dL (ref 0.0–1.2)
CO2: 24 mmol/L (ref 20–29)
Calcium: 9.1 mg/dL (ref 8.7–10.3)
Chloride: 95 mmol/L — ABNORMAL LOW (ref 96–106)
Creatinine, Ser: 1.31 mg/dL — ABNORMAL HIGH (ref 0.57–1.00)
Globulin, Total: 3.1 g/dL (ref 1.5–4.5)
Glucose: 164 mg/dL — ABNORMAL HIGH (ref 70–99)
Potassium: 4.1 mmol/L (ref 3.5–5.2)
Sodium: 136 mmol/L (ref 134–144)
Total Protein: 7.4 g/dL (ref 6.0–8.5)
eGFR: 41 mL/min/{1.73_m2} — ABNORMAL LOW (ref >59)

## 2021-06-24 LAB — MAGNESIUM: Magnesium: 2.5 mg/dL — ABNORMAL HIGH (ref 1.6–2.3)

## 2021-06-24 MED ORDER — AMIODARONE HCL 200 MG PO TABS: 200.0000 mg | ORAL_TABLET | Freq: Every day | ORAL | 3 refills | Status: DC

## 2021-06-24 NOTE — Patient Instructions (Signed)
Medication Instructions:  Your physician has recommended you make the following change in your medication:   INCREASE: Amiodarone to 200mg  daily  *If you need a refill on your cardiac medications before your next appointment, please call your pharmacy*   Lab Work: TODAY: CMET, Mg  If you have labs (blood work) drawn today and your tests are completely normal, you will receive your results only by: [place] (if you have MyChart) OR A paper copy in the mail If you have any lab test that is abnormal or we need to change your treatment, we will call you to review the results.   Follow-Up: At [facility], you and your health needs are our priority.  As part of our continuing mission to provide you with exceptional heart care, we have created designated Provider Care Teams.  These Care Teams include your primary Cardiologist (physician) and Advanced Practice Providers (APPs -  Physician Assistants and Nurse Practitioners) who all work together to provide you with the care you need, when you need it.   Your next appointment:   6 month(s)  The format for your next appointment:   In Person  Provider:   You may see [name], MD or one of the following Advanced Practice Providers on your designated Care Team:   [name], [place] Legrand Como "[facility]" [place], [place]

## 2021-06-24 NOTE — Telephone Encounter (Signed)
RX sent

## 2021-06-27 ENCOUNTER — Encounter: Payer: Self-pay | Admitting: Pharmacist

## 2021-06-27 NOTE — Progress Notes (Signed)
Remote ICD transmission.   

## 2021-07-06 ENCOUNTER — Encounter: Payer: Self-pay | Admitting: Family Medicine

## 2021-07-06 ENCOUNTER — Encounter (HOSPITAL_COMMUNITY): Payer: Self-pay

## 2021-07-06 NOTE — Telephone Encounter (Signed)
Would not recommend the [place]yland's restful leg tablets. The tablets contain Arsenicum album (diluted Arsenic) which can prolong the QT-interval. Patient has a history of VT. Additionally, the Lycopodium is an acetylcholinesterase inhibitor and can have cholinergic effects (I.e. bradycardia). These agents are generally not recommended for patients with cardiovascular disease. Although the ingredients are diluted, homeopathic preparations are not held to the same safety and effectiveness standards as conventional medicines. Therefore, I believe the risk outweighs the benefit. Spoke with patient and she expressed understanding. Encouraged her to follow up with her PCP to see if a different medication could be prescribed to help with her restless leg. Patient was very Patent attorney.

## 2021-07-06 NOTE — Telephone Encounter (Signed)
Msg also sent to CHF clinic who has already spoke with pt and advised her not to take due to risks. I have also refaxed pt's Ozempic pt assistance application, received a message that initial fax failed.

## 2021-07-07 ENCOUNTER — Other Ambulatory Visit: Payer: Self-pay | Admitting: Family Medicine

## 2021-07-07 MED ORDER — ROPINIROLE HCL 0.25 MG PO TABS: ORAL_TABLET | ORAL | 0 refills | Status: DC

## 2021-07-07 NOTE — Telephone Encounter (Signed)
Pt last seen in October. OV?

## 2021-07-12 ENCOUNTER — Telehealth: Payer: Self-pay | Admitting: Pharmacist

## 2021-07-12 NOTE — Telephone Encounter (Signed)
Called pt to see how she was doing on Ozempic 0.25mg  weekly. She states she is doing well. Will increase to 0.5mg  weekly on Sat 12/31. Her blood sugar is better, but still higher than it should be. Will keep an eye on it, incase we need to decrease units. Pt assistance paperwork has been faxed. Will call pt in another 4 weeks to see how she is doing.

## 2021-07-16 DIAGNOSIS — I10 Essential (primary) hypertension: Secondary | ICD-10-CM

## 2021-07-16 DIAGNOSIS — I5020 Unspecified systolic (congestive) heart failure: Secondary | ICD-10-CM

## 2021-07-16 DIAGNOSIS — E785 Hyperlipidemia, unspecified: Secondary | ICD-10-CM

## 2021-07-16 DIAGNOSIS — Z794 Long term (current) use of insulin: Secondary | ICD-10-CM

## 2021-07-16 DIAGNOSIS — E1165 Type 2 diabetes mellitus with hyperglycemia: Secondary | ICD-10-CM | POA: Diagnosis not present

## 2021-07-16 DIAGNOSIS — I480 Paroxysmal atrial fibrillation: Secondary | ICD-10-CM

## 2021-07-20 ENCOUNTER — Other Ambulatory Visit (HOSPITAL_COMMUNITY): Payer: Self-pay | Admitting: Cardiology

## 2021-07-21 ENCOUNTER — Ambulatory Visit (INDEPENDENT_AMBULATORY_CARE_PROVIDER_SITE_OTHER): Payer: Medicare Other | Admitting: Pharmacist

## 2021-07-21 DIAGNOSIS — Z6841 Body Mass Index (BMI) 40.0 and over, adult: Secondary | ICD-10-CM

## 2021-07-21 DIAGNOSIS — I480 Paroxysmal atrial fibrillation: Secondary | ICD-10-CM

## 2021-07-21 DIAGNOSIS — E1165 Type 2 diabetes mellitus with hyperglycemia: Secondary | ICD-10-CM

## 2021-07-21 DIAGNOSIS — Z794 Long term (current) use of insulin: Secondary | ICD-10-CM

## 2021-07-21 DIAGNOSIS — I5020 Unspecified systolic (congestive) heart failure: Secondary | ICD-10-CM

## 2021-07-21 DIAGNOSIS — E785 Hyperlipidemia, unspecified: Secondary | ICD-10-CM

## 2021-07-21 DIAGNOSIS — I10 Essential (primary) hypertension: Secondary | ICD-10-CM

## 2021-07-21 MED ORDER — ROPINIROLE HCL 0.25 MG PO TABS: ORAL_TABLET | ORAL | 0 refills | Status: DC

## 2021-07-21 NOTE — Chronic Care Management (AMB) (Signed)
Chronic Care Management Pharmacy Note  07/21/2021 Name:  Heidi Stark MRN:  [id number] DOB:  1940-08-31  Summary: Heidi Stark coverage gap in March 2022. Benefits have started over for 2023 but patient will reach coverage gap quickly. She has returned applications for patient assistance program for Humalog, Delene Loll and Farxiga. Will complete provider portion and forward to either PCP or appropriate provider to review and sign.  Patient has lost about 14lbs in the last 2 months. She just increase Ozempic to 0.67m weekly on 07/16/2021 and reported 2 or 3 days of nausea but better now. Reminded her to pay attention to when she feels full and stop eating this will help to decrease nausea. I also feel that nausea will improve as she gets adjusted to new 0.521mdose.  LDL not at goal - due to have lipids rechecked with next labs.  Continue to monitor Thyroid function with amiodarone.  Patient report ropinirole is working well for RLS.   Subjective: Heidi Stark an [age].o. 81 year old female who is a primary patient of Heidi Stark.  The CCM team was consulted for assistance with disease management and care coordination needs.    Engaged with patient by telephone for follow up visit in response to provider referral for pharmacy case management and/or care coordination services.   Consent to Services:  The patient was given information about Chronic Care Management services, agreed to services, and gave verbal consent prior to initiation of services.  Please see initial visit note for detailed documentation.   Patient Care Team: Heidi Stark as PCP - General CaConstance Stark as PCP - Electrophysiology (Cardiology) Heidi Stark as Consulting Physician (Ophthalmology) Heidi Stark as Consulting Physician (Endocrinology) Heidi Stark as Case Manager Heidi Stark (Pharmacist)  Recent office visits: 07/06/2021 - PCP (Heidi [name]  Chart message. Patient requested something for restless legs. Prescription for Requip 0.2557m take 1 tablet at bedtime for 3 nights, then increase to 2 tablets at bedtime sent to pharmacy. 05/05/2021 - PCP - phone call. Requested antibiotic. Prescribed for Levaquin 500m54mr 7 days  05/03/2021 - PCP (Heidi LownEtter Sjogrendeo Visit for back pain. Increased cyclobenzaprine 10mg54mto 3 times a day as needed.   Recent consult visits: 06/24/2021 - [facility]idi [facility], [facility] and [facility]cardia. Increased amiodarone back to 200mg 19my.  06/17/2021 - Cardio (clinical pharmacist Maccia) Started Ozempic 0.25mg w31my. Follow up in 3 months. Provided with Novo Nordick patient assistance applicaiton. 05/30/2021 - Cardio (Heidi McLean)[facility]undra Dubinfor CHF and atrial fib. Decreaesd amiodarone from 200mg da58mto 100mg dai41mRestarted metoprolol ER 25mg dail54meferred to pharmacy clinic to start semaglutide fro obesity 01/27/2021 - Cardio (Heidi McLean) [facility]. No med changes. Rechecked TSH - WNL.  01/24/2021 - Endo (Heidi Balan) no [name] available 12/20/2020 - Cardio (Heidi Camnitz) SC[facility] Bears atrial fibrillation.  noted mildly elevated TSH. No supplementation start yet.  12/09/2020 - Cardio (Heidi McLean) [facility]. Added Farxiga 10mg daily31mHospital visits: 04/15/2021 to 04/23/2021 [facility] Heidi Stark. beta blocker stopped due to bradycardia.  New Medications at discharge: levofloxacin 500mg daily 21mcations Stopped at discharge: metoprolol succinate 25mg Medicat1m Changed at Discharge: none   Objective:  Lab Results  Component Value Date   CREATININE 1.31 (H) 06/24/2021   CREATININE 1.42 (H) 05/30/2021   CREATININE 1.04 (  H) 04/23/2021    Lab Results  Component Value Date   HGBA1C 9.0 (H) 04/15/2021   Last diabetic Eye exam:  Lab Results  Component Value Date/Time   HMDIABEYEEXA No  Retinopathy 08/17/2020 12:00 AM    Last diabetic Foot exam: No results found for: HMDIABFOOTEX      Component Value Date/Time   CHOL 186 01/27/2021 1146   CHOL 170 06/02/2019 0913   TRIG 292 (H) 01/27/2021 1146   HDL 34 (L) 01/27/2021 1146   HDL 31 (L) 06/02/2019 0913   CHOLHDL 5.5 01/27/2021 1146   VLDL 58 (H) 01/27/2021 1146   LDLCALC 94 01/27/2021 1146   LDLCALC 92 06/02/2019 0913   LDLCALC 77 06/28/2018 1410   LDLDIRECT 42.0 11/30/2014 1226    Hepatic Function Latest Ref Rng & Units 06/24/2021 05/30/2021 04/23/2021  Total Protein 6.0 - 8.5 g/dL 7.4 7.6 6.1(L)  Albumin 3.7 - 4.7 g/dL 4.3 3.6 2.5(L)  AST 0 - 40 IU/L 39 39 32  ALT 0 - 32 IU/L _0 Alk Phosphatase 44 - 121 IU/L 81 62 45  Total Bilirubin 0.0 - 1.2 mg/dL 0.3 0.6 0.6  Bilirubin, Direct 0.0 - 0.2 mg/dL - - -    Lab Results  Component Value Date/Time   TSH 8.995 (H) 05/30/2021 11:23 AM   TSH 1.188 04/18/2021 12:59 AM   TSH 5.510 (H) 12/20/2020 02:17 PM   TSH 1.740 09/09/2018 01:26 PM   FREET4 1.12 05/30/2021 11:23 AM   FREET4 1.51 12/20/2020 02:17 PM    CBC Latest Ref Rng & Units 04/23/2021 04/22/2021 04/21/2021  WBC 4.0 - 10.5 K/uL 9.0 8.7 10.1  Hemoglobin 12.0 - 15.0 g/dL 14.1 14.6 14.4  Hematocrit 36.0 - 46.0 % 42.9 45.1 45.1  Platelets 150 - 400 K/uL 240 179 213    Lab Results  Component Value Date/Time   VD25OH 4.3 (L) 09/09/2018 01:26 PM   VD25OH 22 (L) 11/18/2009 08:27 PM    Clinical ASCVD: Yes  The 10-year ASCVD risk score (Arnett DK, et al., 2019) is: 48.3%   Values used to calculate the score:     Age: 81 years     Sex: Female     Is Non-Hispanic African American: No     Diabetic: Yes     Tobacco smoker: Yes     Systolic Blood Pressure: 096 mmHg     Is BP treated: Yes     HDL Cholesterol: 34 mg/dL     Total Cholesterol: 186 mg/dL    Other: CHADS2VASc = 6  Social History   Tobacco Use  Smoking Status Every Day   Packs/day: 1.00   Years: 50.00   Pack years: 50.00   Types:  Cigarettes   Last attempt to quit: 08/17/2020   Years since quitting: 0.9  Smokeless Tobacco Never  Tobacco Comments   Pt started smoking again   BP Readings from Last 3 Encounters:  06/24/21 110/60  06/17/21 110/70  05/30/21 104/70   Pulse Readings from Last 3 Encounters:  06/24/21 70  06/17/21 75  05/30/21 74   Wt Readings from Last 3 Encounters:  06/24/21 254 lb (115.2 kg)  06/17/21 268 lb 12.8 oz (121.9 kg)  05/30/21 268 lb 3.2 oz (121.7 kg)    Assessment: Review of patient past medical history, allergies, medications, health status, including review of consultants reports, laboratory and other test data, was performed as part of comprehensive evaluation and provision of chronic care management services.   SDOH:  (Social  Determinants of Health) assessments and interventions performed:     CCM Care Plan  Allergies  Allergen Reactions   Neomycin-Bacitracin Zn-Polymyx Rash   Sulfonamide Derivatives Other (See Comments)    Stomach cramps   Ciprofloxacin Hives, Itching and Rash    Pt doesn't remember having any reaction to cipro   Spironolactone Rash    Medications Reviewed Today     Reviewed by [name], RPH-CPP (Pharmacist) on 07/21/21 at 85  Med List Status: <None>   Medication Order Taking? Sig Documenting Provider Last Dose Status Informant  acetaminophen (TYLENOL) 500 MG tablet [id number] Yes Take 500 mg by mouth every 6 (six) hours as needed for moderate pain or headache. [provider] Taking Active Self  albuterol (VENTOLIN HFA) 108 (90 Base) MCG/ACT inhaler [id number] Yes Inhale 2 puffs into the lungs every 4 (four) hours as needed for wheezing or shortness of breath. Geradine Girt, DO Taking Active   amiodarone (PACERONE) 200 MG tablet [id number] Yes Take 1 tablet (200 mg total) by mouth daily. Shirley Friar, PA-C Taking Active   apixaban (ELIQUIS) 5 MG TABS tablet [id number] Yes Take 1 tablet (5 mg total) by mouth 2 (two) times daily.  Lyda Jester M, PA-C Taking Active Self  cyclobenzaprine (FLEXERIL) 10 MG tablet [id number] Yes TAKE 1 TABLET BY MOUTH THREE TIMES A DAY AS NEEDED FOR MUSCLE SPASMS Ann Held, DO Taking Active   eplerenone (INSPRA) 25 MG tablet [id number] Yes TAKE 1 TABLET (25 MG TOTAL) BY MOUTH DAILY. Larey Dresser, MD Taking Active Self  FARXIGA 10 MG TABS tablet [id number] Yes TAKE 1 TABLET BY MOUTH EVERY DAY BEFORE BREAKFAST Larey Dresser, MD Taking Active   fenofibrate (TRICOR) 145 MG tablet [id number] Yes TAKE 1 TABLET BY MOUTH EVERY DAY Larey Dresser, MD Taking Active   furosemide (LASIX) 40 MG tablet [id number] Yes Take 2 tablets (80 mg total) by mouth 2 (two) times daily. Larey Dresser, MD Taking Active Self  HUMALOG MIX 75/25 KWIKPEN (75-25) 100 UNIT/ML KwikPen [id number] Yes Inject 30-40 Units into the skin See admin instructions. Take 40 units in the AM, then take 30 units in the afternoon per patient [provider] Taking Active Self  icosapent Ethyl (VASCEPA) 1 g capsule [id number] Yes TAKE 2 CAPSULES BY MOUTH TWICE A DAY Larey Dresser, MD Taking Active   LIVALO 4 MG TABS [id number] Yes TAKE 1 TABLET BY MOUTH EVERY DAY Larey Dresser, MD Taking Active   metoprolol succinate (TOPROL XL) 25 MG 24 hr tablet [id number] Yes Take 1 tablet (25 mg total) by mouth daily. Larey Dresser, MD Taking Active   Multiple Vitamin (MULTIVITAMIN WITH MINERALS) TABS tablet [id number] Yes Take 1 tablet by mouth daily. [provider] Taking Active Self  potassium chloride (KLOR-CON M10) 10 MEQ tablet [id number] Yes Take 2 tablets (20 mEq total) by mouth 2 (two) times daily. Larey Dresser, MD Taking Active   rOPINIRole (REQUIP) 0.25 MG tablet [id number]  Take 1 tablet at bedtime for 3 to 4 nights, then increase to take 2 tablets at bedtime [name], [name], DO  Active            Med Note [facility], Keanu Lesniak B   Thu Jul 21, 2021 10:42 AM) Patient is taking as needed   sacubitril-valsartan (ENTRESTO) 49-51 MG [id number] Yes Take 1 tablet by mouth 2 (two) times daily. Larey Dresser, MD Taking Active Self  Semaglutide,0.25 or 0.5MG/DOS, (OZEMPIC, 0.25 OR 0.5  MG/DOSE,) 2 MG/1.5ML SOPN [id number] Yes Inject 0.25 mg into the skin once a week.  Patient taking differently: Inject 0.5 mg into the skin once a week.   Larey Dresser, MD Taking Active            Med Note [facility], Adria Dill Jul 21, 2021 10:42 AM) Dose increased to 0.45m weekly 07/16/2021            Patient Active Problem List   Diagnosis Date Noted   COVID-19 04/15/2021   Respiratory failure with hypoxia ([place] 070/62/3762  Systolic CHF (HW[place] 083/15/1761  CKD (chronic kidney disease) stage 3, GFR 30-59 ml/min (HCC) 04/15/2021   Elevated troponin 04/15/2021   Acute on chronic systolic (congestive) heart failure ([place] 09/14/2020   Wide-complex tachycardia 09/07/2020   Ventricular tachyarrhythmia 09/07/2020   VT (ventricular tachycardia) 09/07/2020   Persistent atrial fibrillation ([place] 04/30/2018   Wound of left leg 10/01/2017   Cellulitis of right lower extremity 11/27/2016   Encounter for assessment for deep vein thrombosis (DVT) 11/14/2016   Hypokalemia 08/13/2016   Constipation 08/13/2016   Type 2 diabetes mellitus with hyperglycemia, with long-term current use of insulin (HCC)    Atherosclerosis of aorta ([place] 06/26/2016   Tobacco abuse 06/26/2016   Colitis 11/24/2014   Severe obesity (BMI >= 40) ([facility] 11/12/2013   PAF (paroxysmal atrial fibrillation) (HFriend[place] 11/03/2013   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 10/28/2013   COLONIC POLYPS 08/16/2010   PAD (peripheral artery disease) ([facility] 08/15/2010   HLD (hyperlipidemia) 11/18/2009   MYALGIA 11/18/2009   OBESITY 05/14/2007   PULMONARY NODULE, RIGHT MIDDLE LOBE 05/14/2007   Obstructive sleep apnea 12/06/2006   Essential hypertension 12/06/2006   COPD (chronic obstructive pulmonary disease)  (HM[place] 12/06/2006    Immunization History  Administered Date(s) Administered   Influenza Whole 04/28/2009, 04/27/2010   Influenza, High Dose Seasonal PF 04/24/2014   Influenza-Unspecified 04/17/2015   Pneumococcal Conjugate-13 10/11/2015   Pneumococcal Polysaccharide-23 07/17/2002, 11/29/2011   Td 07/17/2002, 06/28/2018   Zoster, Live 11/11/2008    Conditions to be addressed/monitored: Atrial Fibrillation, CHF, CAD, HTN, HLD, Hypertriglyceridemia, COPD, and DMII  Care Plan : General Pharmacy (Adult)  Updates made by ECherre Robins RPH-CPP since 07/21/2021 12:00 AM     Problem: HTN; CHF; atrial fibrillation, Hyperlipidemia; type 2 DM, insulin dependent;   Priority: High  Onset Date: 05/11/2021     Long-Range Goal: Provide education, support and care coordination for medication therapy and chronic conditions   Start Date: 05/11/2021  Expected End Date: 05/11/2022  This Visit's Progress: On track  Priority: High  Note:   Current Barriers:  Unable to independently afford treatment regimen Unable to achieve control of type 2 diabetes  Unable to maintain control of hypertension and hyperlipidemia   Pharmacist Clinical Goal(s):  Over the next 180 days, patient will verbalize ability to afford treatment regimen achieve control of type 2 diabetes as evidenced by A1c <8.0% (to start, will adjust goal as needed) maintain control of hypertension and hyperlipidemia  as evidenced by blood pressure <140/90 and LDL <70  through collaboration with PharmD and provider.   Interventions: 1:1 collaboration with LCarollee Herter YAlferd Apa DO regarding development and update of comprehensive plan of care as evidenced by provider attestation and co-signature Inter-disciplinary care team collaboration (see longitudinal plan of care) Comprehensive medication review performed; medication list updated in electronic medical record  Diabetes: Uncontrolled; Initial A1c goal <8.0% - will try to get <  7.5% if  able without increase in hypoglycemia Managed by endocrinologist - Heidi [name] (last visit 07/01/2021) Current treatment: Humalog 75/25 Mix - inject 40 units each morning and 30 units each evening Farxiga 10mg  daily before breakfast Ozempic 0.5mg  injected subcutaneously once weekly for 4 weeks Started 0.5mg  07/16/2021 Past treatments: Invokamet; Lantus Ozempic was initiated by Heidi [name] for DM, CHD and weight loss. Weight is down 14 lbs since 05/30/2021 Wt Readings from Last 3 Encounters:  06/24/21 254 lb (115.2 kg)  06/17/21 268 lb 12.8 oz (121.9 kg)  05/30/21 268 lb 3.2 oz (121.7 kg)  Current glucose readings:  AM: 140's PM: 160 to 170 Denies hypoglycemic/hyperglycemic symptoms At November 2022 appointment provided patient assistance program applications for [place] (Humalog) and [place] and [place] for [name] at last visit. She is out of coverage gap for 2022 but entered in March 2022. Patient has returned 2023 patient assistance program applications.   Interventions:  Completed provider portion of patient assistance program applications and will route to appropriate provider for review and signature.  Also checked status of Ozempic patient assistance program - per automated system then need copy of patient's insurance card. Faxed to [employer].  Reviewed home blood glucose readings and reviewed goals  Fasting blood glucose goal (before meals) = 80 to 130 Blood glucose goal after a meal = less than 180   Hypertension / CHF: Controlled CHF  managed by Heidi [name] with [facility] Current treatment: Farxiga 10mg  daily  Entresto 49-51mg  - take 1 tablet twice a day Inspra / Eplerenone 25mg  once a day Furosemide 40mg  - take 2 tablets = 80mg  twice a day Potassium 10 mEq - take 2 tablets twice a day  Metoprolol ER 25mg  daily (Restarted 05/30/2021 - in past had hypotension with beta blockers) Current home readings: Patient could not recall exact numbers and did not have record but  states blood pressure never >140/90; Last BNP = 61.7 (04/23/2021); previous BNP was 106 (12/09/2020) EF = 55% (12/2017) Screened for patient assistance. Not needed for 2022 but reached coverage gap by March 2022. Provided medication assistance program applications for Horton Chin for 2023 at previous visit.  Interventions:  Reviewed blood pressure and medications Patient has returned 2023 patient assistance program applications for [place].  Completed provider portion of patient assistance program applications and will route to appropriate provider for review and signature. Continue to follow up with [facility]  Hyperlipidemia: Uncontrolled; LDL goal <70 Current treatment: Livalo 4mg  daily  Fenofibrate 145mg  daily Vascepa / icosapent ethyl 1 gram - take 2 capsules twice a day Medications previously tried: pravastatin and rosuvastatin  Interventions:  Discussed LDL goals Screened for patient assistance for Livalo but patient's income was too high Continue current medications for lipid lowering. Recommend checking lipids with next labs.  Dicussed limiting saturated and trans fat in diet. (Addressed at previous visit)   Atrial Fibrillation: Controlled;  current rate/rhythm control Amiodarone 100mg  daily - takeing 0.5 tablet of 200mg  daily (dose lowered 05/2021 per Heidi [name]) anticoagulant treatment: Eliquis 5mg  twice a day CHADS2VASc score: 6 TSH was slightly elevated when checked 05/30/2021 but T4 and T3 were WNL. LFTS were WNL Patient reports she has had eye exam.  Intervention:  Reminded patient to monitor for signs and symptoms of bleeding Continue to monitor LFTs and thyroid function and get annual eye exam while taking amiodarone Will continue to follow in 2023 and provide medication assistance program application for Eliquis (cannot apply until reaches  coverage gap)  Health Maintenance:  Reviewed vaccination history and discussed benefits of  COVID, influenza, Shingrix vaccines Patient declines to get COVID, influenza or Shingrix vaccines at this time Patient states she has had diabetic eye exam this year. Contacted Heidi Benna Dunks office and exam was 09/01/2020. Requested [facility] to fax copy of report to our office. (Completed at last visit - health maintenance now shows patient was compliant with eye exam in 2022)  Medication management Pharmacist Clinical Goal(s): Over the next 90 days, patient will work with PharmD and providers to maintain optimal medication adherence Current pharmacy[facility] Comprehensive medication review performed. Reviewed refill history and assessed adherence Continue current medication management strategy  Patient Goals/Self-Care Activities Over the next 180 days, patient will:  take medications as prescribed, check blood pressure 3 to 4 times per week, document, and provide at future appointments, weigh daily, and contact provider if weight gain of more than 3 lbs in 24 hours or 5 lbs in 1 week, and collaborate with provider on medication access solutions  Follow Up Plan: Telephone follow up appointment with care management team member scheduled for:  1 to 2 months to check medication assistance program process for 2023.         Medication Assistance: Patient has returned applications for Humalog, [name] and Entresto patient assistance program. Will complete provider portion and forward to either PCP or specialist for review and signature.  Patient did not qualify for patient assistance program for Livalo due to income restrictions.  Checked on Ozempic patient assistance program. Per automatic system application has not been processed due to needing additional information. States they need copy of her insurance card. Faxed copy of card to [facility].   Patient's preferred pharmacy is:  [id number] [place]Phone: [phone]: [phone] [id number] [address]: [phone]: [phone]  Follow Up:  Patient agrees to Care Plan and Follow-up.  Plan: Telephone follow up appointment with care management team member scheduled for:  1 to 2 months  TaCherre RobinsPharmD Clinical Pharmacist [facility] CityeN[facility] BeachiSaint [facility]

## 2021-07-21 NOTE — Patient Instructions (Addendum)
Heidi Stark It was a pleasure speaking with you today.  I have attached a summary of our visit today and information about your health goals.   Our next appointment is by telephone on Aug 25, 2021 at 11am  Please call the care guide team at [phone] if you need to cancel or reschedule your appointment.   If you have any questions or concerns, please feel free to contact me either at the phone number below or with a MyChart message.   Keep up the good work!  [name], PharmD Clinical Pharmacist [facility] [phone] (direct line)  [phone] (main office number)  Care Plan (Updated 07/21/2021) Diabetes: Uncontrolled; Initial A1c goal <8.0% - will try to get <7.5% if able without increase in hypoglycemia  Lab Results  Component Value Date   HGBA1C 9.0 (H) 04/15/2021    Managed by endocrinologist - Dr [name] Current treatment: Humalog 75/25 Mix - inject 40 units each morning and 30 units each evening Farxiga 10mg  daily before breakfast Ozempic inject 0.5mg  weekly Interventions:  Reviewed home blood glucose readings and reviewed goals  Fasting blood glucose goal (before meals) = 80 to 130 Blood glucose goal after a meal = less than 180  Follow up with Dr [name] for adjustment in insulin therpay We will completed application process for Farxiga and Humalog  Hypertension / CHF: BP Readings from Last 3 Encounters:  06/24/21 110/60  06/17/21 110/70  05/30/21 104/70    Current treatment: [name] 10mg  daily  Entresto 49-51mg  - take 1 tablet twice a day Inspra / Eplerenone 25mg  once a day Furosemide 40mg  - take 2 tablets = 80mg  twice a day Potassium 10 mEq - take 2 tablets twice a day  Metoprolol ER 25mg  daily  Interventions:  Reviewed blood pressure and medications Continue to follow up with [facility]  Hyperlipidemia: Uncontrolled; LDL goal <70  Lab Results  Component Value Date   CHOL 186 01/27/2021   HDL 34  (L) 01/27/2021   LDLCALC 94 01/27/2021   LDLDIRECT 42.0 11/30/2014   TRIG 292 (H) 01/27/2021   CHOLHDL 5.5 01/27/2021    Current treatment: Livalo 4mg  daily  Fenofibrate 145mg  daily Vascepa / icosapent ethyl 1 gram - take 2 capsules twice a day Interventions:  Discussed LDL goals Continue current medications for lipid lowering. Recommend checking lipids with next labs.  Discussed limiting saturated and trans fat in diet.   Atrial Fibrillation: current rate/rhythm control Amiodarone 100mg  (taking 0.5 tablet of 200mg ) daily (dose lowered 05/30/2021) anticoagulant treatment: Eliquis 5mg  twice a day Intervention:  Reminded patient to monitor for signs and symptoms of bleeding Monitor liver function and thyroid function and get annual eye exam while taking amiodarone Will continue to follow in 2023 and provide medication assistance program application for Eliquis (cannot apply until reaches coverage gap)  Medication management Pharmacist Clinical Goal(s): Over the next 90 days, patient will work with PharmD and providers to maintain optimal medication adherence Current pharmacy: [facility] Comprehensive medication review performed. Reviewed refill history and assessed adherence Continue current medication management strategy Patient was not aware the antibiotic levofloxacin has been called into her pharmacy 2 days ago. Made her aware and she will pick up today.  Patient Goals/Self-Care Activities Over the next 180 days, patient will:  take medications as prescribed, check blood pressure 3 to 4 times per week, document, and provide at future appointments, weigh daily, and contact provider if weight gain of more than 3 lbs in 24  hours or 5 lbs in 1 week, and collaborate with provider on medication access solutions  Follow Up Plan: Telephone follow up appointment with care management team member scheduled for:  1 to 2 months to check medication assistance program process for 2023  and home blood glucose readings.  Patient verbalizes understanding of instructions provided today and agrees to view in [place].

## 2021-07-22 ENCOUNTER — Other Ambulatory Visit (HOSPITAL_COMMUNITY): Payer: Self-pay | Admitting: Cardiology

## 2021-07-25 ENCOUNTER — Telehealth: Payer: Medicare Other

## 2021-07-27 ENCOUNTER — Other Ambulatory Visit: Payer: Self-pay | Admitting: Cardiology

## 2021-07-27 DIAGNOSIS — E876 Hypokalemia: Secondary | ICD-10-CM

## 2021-07-28 ENCOUNTER — Ambulatory Visit: Payer: Medicare Other

## 2021-07-28 DIAGNOSIS — Z794 Long term (current) use of insulin: Secondary | ICD-10-CM

## 2021-07-28 DIAGNOSIS — E1165 Type 2 diabetes mellitus with hyperglycemia: Secondary | ICD-10-CM

## 2021-07-28 DIAGNOSIS — I5020 Unspecified systolic (congestive) heart failure: Secondary | ICD-10-CM

## 2021-07-28 NOTE — Patient Instructions (Signed)
Visit Information  Thank you for taking time to visit with me today. Please don't hesitate to contact me if I can be of assistance to you before our next scheduled telephone appointment.  Following are the goals we discussed today:  Patient Goals/Self-Care Activities: Patient will self administer medications as prescribed Patient will attend all scheduled provider appointments Patient will call provider office for new concerns or questions Continue to active and exercise per provider recommendation Check your blood sugars as recommended by your provider, record and take to your provider visits. Continue to eat healthy: : eat plenty of vegetables, fruits, lean meats; monitor your fat intake; limit: sugars, rice, potatoes, pasta. Monitor salt intake  Our next appointment is by telephone on 09/26/21 at 10:00 am  Please call the care guide team at [phone] if you need to cancel or reschedule your appointment.   If you are experiencing a Mental Health or [place] or need someone to talk to, please call the Suicide and Crisis Lifeline: 988 call 1-800-273-TALK (toll free, 24 hour hotline)   Patient verbalizes understanding of instructions and care plan provided today and agrees to view in [place]. Active MyChart status confirmed with patient.    Thea Silversmith, RN, MSN, BSN, CCM Care Management Coordinator [facility] [phone]

## 2021-07-28 NOTE — Chronic Care Management (AMB) (Signed)
Chronic Care Management   CCM RN Visit Note  07/28/2021 Name: Heidi Stark MRN: [id number] DOB: May 14, 1941  Subjective: Heidi Stark is a 81 y.o. year old female who is a primary care patient of Ann Held, DO. The care management team was consulted for assistance with disease management and care coordination needs.    Engaged with patient by telephone for follow up visit in response to provider referral for case management and/or care coordination services.   Consent to Services:  The patient was given information about Chronic Care Management services, agreed to services, and gave verbal consent prior to initiation of services.  Please see initial visit note for detailed documentation.   Patient agreed to services and verbal consent obtained.   Assessment: Review of patient past medical history, allergies, medications, health status, including review of consultants reports, laboratory and other test data, was performed as part of comprehensive evaluation and provision of chronic care management services.   SDOH (Social Determinants of Health) assessments and interventions performed:    CCM Care Plan  Allergies  Allergen Reactions   Neomycin-Bacitracin Zn-Polymyx Rash   Sulfonamide Derivatives Other (See Comments)    Stomach cramps   Ciprofloxacin Hives, Itching and Rash    Pt doesn't remember having any reaction to cipro   Spironolactone Rash    Outpatient Encounter Medications as of 07/28/2021  Medication Sig Note   acetaminophen (TYLENOL) 500 MG tablet Take 500 mg by mouth every 6 (six) hours as needed for moderate pain or headache.    albuterol (VENTOLIN HFA) 108 (90 Base) MCG/ACT inhaler Inhale 2 puffs into the lungs every 4 (four) hours as needed for wheezing or shortness of breath.    amiodarone (PACERONE) 200 MG tablet Take 1 tablet (200 mg total) by mouth daily.    apixaban (ELIQUIS) 5 MG TABS tablet Take 1 tablet (5 mg total) by mouth 2 (two) times daily.     cyclobenzaprine (FLEXERIL) 10 MG tablet TAKE 1 TABLET BY MOUTH THREE TIMES A DAY AS NEEDED FOR MUSCLE SPASMS    eplerenone (INSPRA) 25 MG tablet TAKE 1 TABLET (25 MG TOTAL) BY MOUTH DAILY.    FARXIGA 10 MG TABS tablet TAKE 1 TABLET BY MOUTH EVERY DAY BEFORE BREAKFAST    fenofibrate (TRICOR) 145 MG tablet TAKE 1 TABLET BY MOUTH EVERY DAY    furosemide (LASIX) 40 MG tablet Take 2 tablets (80 mg total) by mouth 2 (two) times daily.    HUMALOG MIX 75/25 KWIKPEN (75-25) 100 UNIT/ML KwikPen Inject 30-40 Units into the skin See admin instructions. Take 40 units in the AM, then take 30 units in the afternoon per patient    icosapent Ethyl (VASCEPA) 1 g capsule TAKE 2 CAPSULES BY MOUTH TWICE A DAY    LIVALO 4 MG TABS TAKE 1 TABLET BY MOUTH EVERY DAY    metoprolol succinate (TOPROL XL) 25 MG 24 hr tablet Take 1 tablet (25 mg total) by mouth daily.    Multiple Vitamin (MULTIVITAMIN WITH MINERALS) TABS tablet Take 1 tablet by mouth daily.    OZEMPIC, 0.25 OR 0.5 MG/DOSE, 2 MG/1.5ML SOPN INJECT 0.25MG INTO THE SKIN ONCE WEEKLY    potassium chloride (KLOR-CON M10) 10 MEQ tablet Take 2 tablets (20 mEq total) by mouth 2 (two) times daily.    rOPINIRole (REQUIP) 0.25 MG tablet Take 1 tablet at bedtime for 3 to 4 nights, then increase to take 2 tablets at bedtime 07/21/2021: Patient is taking as needed   sacubitril-valsartan (  ENTRESTO) 49-51 MG Take 1 tablet by mouth 2 (two) times daily.    No facility-administered encounter medications on file as of 07/28/2021.    Patient Active Problem List   Diagnosis Date Noted   COVID-19 04/15/2021   Respiratory failure with hypoxia ([place]) 27/09/5007   Systolic CHF ([place]) 38/18/2993   CKD (chronic kidney disease) stage 3, GFR 30-59 ml/min (HCC) 04/15/2021   Elevated troponin 04/15/2021   Acute on chronic systolic (congestive) heart failure ([place]) 09/14/2020   Wide-complex tachycardia 09/07/2020   Ventricular tachyarrhythmia 09/07/2020   VT (ventricular tachycardia)  09/07/2020   Persistent atrial fibrillation ([place]) 04/30/2018   Wound of left leg 10/01/2017   Cellulitis of right lower extremity 11/27/2016   Encounter for assessment for deep vein thrombosis (DVT) 11/14/2016   Hypokalemia 08/13/2016   Constipation 08/13/2016   Type 2 diabetes mellitus with hyperglycemia, with long-term current use of insulin (HCC)    Atherosclerosis of aorta ([place]) 06/26/2016   Tobacco abuse 06/26/2016   Colitis 11/24/2014   Severe obesity (BMI >= 40) ([place]) 11/12/2013   PAF (paroxysmal atrial fibrillation) ([place]) 11/03/2013   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 10/28/2013   COLONIC POLYPS 08/16/2010   PAD (peripheral artery disease) ([place]) 08/15/2010   HLD (hyperlipidemia) 11/18/2009   MYALGIA 11/18/2009   OBESITY 05/14/2007   PULMONARY NODULE, RIGHT MIDDLE LOBE 05/14/2007   Obstructive sleep apnea 12/06/2006   Essential hypertension 12/06/2006   COPD (chronic obstructive pulmonary disease) ([place]) 12/06/2006    Conditions to be addressed/monitored:Atrial Fibrillation, CHF, HTN, COPD, and DMII  Care Plan : RN Care Manager Plan of Care  Updates made by Luretha Rued, RN since 07/28/2021 12:00 AM     Problem: No plan of Care Established for Management of Chronic Disease   Priority: High     Long-Range Goal: Development of Plan of Care for Chronic Disease Managment   Start Date: 05/12/2021  Expected End Date: 11/10/2021  Priority: High  Note:   Current Barriers: admit 04/15/21-04/23/21 with COVID/PNA/acute on chronic CHF in a patient with history of COPD. DM- Hemoglobin A1C 9 on 04/15/21. Ms San reports blood sugars are "coming down".  She states blood sugar this morning was 151. She denies any complaints or signs/symptoms of CHF/COPD exacerbation. Continues to follow up with cardiology. Attempted to review medications, patient declines as was reviewed on 07/21/21 with clinical pharmacist. She denies any problems or concerns at this  time. Request a follow up from [facility] in 2 months vs one month. Chronic Disease Management support and education needs related to Atrial Fibrillation, CHF, COPD, DMII, and chronic low back pain   RNCM Clinical Goal(s):  Patient will verbalize understanding of plan for management of Atrial Fibrillation, CHF, COPD, DMII, and chronic low back pain as evidenced by self report and/or chart notation take all medications exactly as prescribed and will call provider for medication related questions as evidenced by self report and/or chart notation demonstrate ongoing self health care management ability   as evidenced by attending provider visits as scheduled, taking medications as prescribed, contacting provider with questions and concerns as needed  through collaboration with RN Care manager, provider, and care team.   Interventions: 1:1 collaboration with primary care provider regarding development and update of comprehensive plan of care as evidenced by provider attestation and co-signature Inter-disciplinary care team collaboration (see longitudinal plan of care) Evaluation of current treatment plan related to  self management and patient's adherence to plan as established by  provider  AFIB Interventions: (Status:  Goal on track:  Yes.) Long Term Goal Medications reviewed, reiterated the importance of taking medications as prescribed. Encouraged to continue to attend provider appointments as recommended Reviewed upcoming appointments with patient  Hypertension Interventions: Long term: Goal on track Has not checked at home BP Readings from Last 3 Encounters:  06/24/21 110/60  06/17/21 110/70  05/30/21 104/70  Most recent eGFR/CrCl: No results found for: EGFR  No components found for: CRCL Discussed latest office blood pressure readings with patient. Reviewed upcoming provider appointments Encouraged to continue to take medications as prescribed Provided positive feedback regarding self health  management  Diabetes Interventions: Long Term: goal on track Assessed patient's understanding of A1c goal: <8% Reviewed medications with patient and discussed importance of medication adherence Reviewed A1C goal; Fasting BS goal 80-130 and <180 after meals. Encouraged to eat healthy-low sodium, carb modified  Discussed plans with patient for ongoing care management follow up and provided patient with direct contact information for care management team Lab Results  Component Value Date   HGBA1C 9.0 (H) 04/15/2021  COPD Interventions: Long Term: goal on track  Medications reviewed with patient and encouraged to take as prescribed. Encouraged to attend provider visits as scheduled.  Heart Failure Interventions: Long Term: goal on track Reviewed signs/symptoms of HF exacerbation Reiterated the importance of daily weights and when to call the doctor Upcoming scheduled appointments reviewed with patient  Patient Goals/Self-Care Activities: Patient will self administer medications as prescribed Patient will attend all scheduled provider appointments Patient will call provider office for new concerns or questions Continue to active and exercise per provider recommendation Check your blood sugars as recommended by your provider, record and take to your provider visits. Continue to eat healthy: : eat plenty of vegetables, fruits, lean meats; monitor your fat intake; limit: sugars, rice, potatoes, pasta. Monitor salt intake   Plan:Telephone follow up appointment with care management team member scheduled for:  09/26/21 The patient has been provided with contact information for the care management team and has been advised to call with any health related questions or concerns.    Thea Silversmith, RN, MSN, BSN, CCM Care Management Coordinator [facility] [phone]

## 2021-07-29 NOTE — Telephone Encounter (Signed)
This is a CHF pt, Dr. Mclean 

## 2021-07-30 ENCOUNTER — Other Ambulatory Visit: Payer: Self-pay | Admitting: Family Medicine

## 2021-08-09 ENCOUNTER — Telehealth: Payer: Self-pay | Admitting: Pharmacist

## 2021-08-09 NOTE — Telephone Encounter (Signed)
Called pt to see how she is doing on Ozempic. She states she is doing well. On Ozempic 0.5mg  weekly. Increased 2 weeks ago. We are still waiting to hear from pt assistance. I called [employer], cannot speak to a human as I was on hold 30 min. Automated machine said it was missing info about [facility]- name and address. Although this info was on the application. I have rewitten and faxed. Will follow up again in a few days.

## 2021-08-15 ENCOUNTER — Other Ambulatory Visit (HOSPITAL_COMMUNITY): Payer: Self-pay | Admitting: Cardiology

## 2021-08-16 DIAGNOSIS — E1165 Type 2 diabetes mellitus with hyperglycemia: Secondary | ICD-10-CM

## 2021-08-16 DIAGNOSIS — E785 Hyperlipidemia, unspecified: Secondary | ICD-10-CM | POA: Diagnosis not present

## 2021-08-16 DIAGNOSIS — I5020 Unspecified systolic (congestive) heart failure: Secondary | ICD-10-CM

## 2021-08-16 DIAGNOSIS — I480 Paroxysmal atrial fibrillation: Secondary | ICD-10-CM | POA: Diagnosis not present

## 2021-08-16 DIAGNOSIS — Z794 Long term (current) use of insulin: Secondary | ICD-10-CM

## 2021-08-16 DIAGNOSIS — I10 Essential (primary) hypertension: Secondary | ICD-10-CM | POA: Diagnosis not present

## 2021-08-23 MED ORDER — OZEMPIC (1 MG/DOSE) 4 MG/3ML ~~LOC~~ SOPN: 1.0000 mg | PEN_INJECTOR | SUBCUTANEOUS | 1 refills | Status: DC

## 2021-08-23 NOTE — Telephone Encounter (Signed)
Called pt. States she is doing well on Ozempic 0.5mg  weekly. Little nausea but doing ok. Ready to increase to 1mg  weekly. Rx sent. Blood sugars are coming down slowly. Her fasting sugars are about 140. Before dinner 170 Advised to continue to monitor. I will f/u with patient in another 4 weeks.  States she lost a few pounds. Hasn't weighed herself in a few days.

## 2021-08-25 ENCOUNTER — Ambulatory Visit (INDEPENDENT_AMBULATORY_CARE_PROVIDER_SITE_OTHER): Payer: Medicare Other | Admitting: Pharmacist

## 2021-08-25 DIAGNOSIS — Z794 Long term (current) use of insulin: Secondary | ICD-10-CM

## 2021-08-25 DIAGNOSIS — E1165 Type 2 diabetes mellitus with hyperglycemia: Secondary | ICD-10-CM

## 2021-08-25 DIAGNOSIS — E785 Hyperlipidemia, unspecified: Secondary | ICD-10-CM

## 2021-08-25 DIAGNOSIS — I5020 Unspecified systolic (congestive) heart failure: Secondary | ICD-10-CM

## 2021-08-25 NOTE — Chronic Care Management (AMB) (Signed)
Chronic Care Management Pharmacy Note  08/25/2021 Name:  Heidi Stark MRN:  [id number] DOB:  12/26/1940  Summary: Heidi Stark coverage gap in March 2022. Benefits have started over for 2023 but patient will reach coverage gap quickly. 16/38/4536 we faxed applications for patient assistance program for Humalog, Entresto and Farxiga. [name] has been approved for patient assistance program thru 07/16/2021 (verified today, patient has received first shipment). Entresto patient assistance program requested additional financial documentation and patient mailed to them 46/80/3212 - application still pending.   Patient has lost about 14lbs in the last 3 months. She will increase Ozempic to 74m weekly with next prescription fill.   LDL not at goal - due to have lipids rechecked with next labs. IF LDL still >70 consider adding ezetimibe 148mdaily. Continue to monitor Thyroid function with amiodarone.    Subjective: Heidi Stark an 81 year old female who is a primary patient of [name].  The CCM team was consulted for assistance with disease management and care coordination needs.    Engaged with patient by telephone for follow up visit in response to provider referral for pharmacy case management and/or care coordination services.   Consent to Services:  The patient was given information about Chronic Care Management services, agreed to services, and gave verbal consent prior to initiation of services.  Please see initial visit note for detailed documentation.   Patient Care Team: [name] as PCP - General CaConstance HawMD as PCP - Electrophysiology (Cardiology) [name] as Consulting Physician (Ophthalmology) [name] as Consulting Physician (Endocrinology) [name] as Case Manager [name] (Pharmacist)  Recent office visits: 07/06/2021 - PCP (Dr [name] Chart message. Patient requested  something for restless legs. Prescription for Requip 0.2513m take 1 tablet at bedtime for 3 nights, then increase to 2 tablets at bedtime sent to pharmacy. 05/05/2021 - PCP - phone call. Requested antibiotic. Prescribed for Levaquin 500m22mr 7 days  05/03/2021 - PCP (Dr LownEtter Sjogrendeo Visit for back pain. Increased cyclobenzaprine 10mg2mto 3 times a day as needed.   Recent consult visits: 08/23/2021 - [facility] Increased Ozempic to 1mg w62mly. 06/24/2021 - Cardio ([place], PAC) EP office visit for paroxysmal atrial fibrillation and ventricular tachycardia. Increased amiodarone back to 200mg d71m.  06/17/2021 - Cardio (clinical pharmacist Maccia) Started Ozempic 0.25mg we73m. Follow up in 3 months. Provided with Novo Nordick patient assistance applicaiton. 05/30/2021 - Cardio (Dr McLean) [name] CHF and atrial fib. Decreaesd amiodarone from 200mg dai37mo 100mg dail77mestarted metoprolol ER 25mg daily2mferred to pharmacy clinic to start semaglutide fro obesity  Hospital visits: 04/15/2021 to 04/23/2021 [facility] Received Remdesivir. beta blocker stopped due to bradycardia.  New Medications at discharge: levofloxacin 500mg daily 35mcations Stopped at discharge: metoprolol succinate 25mg Medicat58m Changed at Discharge: none   Objective:  Lab Results  Component Value Date   CREATININE 1.31 (H) 06/24/2021   CREATININE 1.42 (H) 05/30/2021   CREATININE 1.04 (H) 04/23/2021    Lab Results  Component Value Date   HGBA1C 9.0 (H) 04/15/2021   Last diabetic Eye exam:  Lab Results  Component Value Date/Time   HMDIABEYEEXA No Retinopathy 08/17/2020 12:00 AM    Last diabetic Foot exam: No results found for: HMDIABFOOTEX      Component Value Date/Time   CHOL 186 01/27/2021  1146   CHOL 170 06/02/2019 0913   TRIG 292 (H) 01/27/2021 1146   HDL 34 (L) 01/27/2021 1146   HDL 31 (L) 06/02/2019  0913   CHOLHDL 5.5 01/27/2021 1146   VLDL 58 (H) 01/27/2021 1146   LDLCALC 94 01/27/2021 1146   LDLCALC 92 06/02/2019 0913   LDLCALC 77 06/28/2018 1410   LDLDIRECT 42.0 11/30/2014 1226    Hepatic Function Latest Ref Rng & Units 06/24/2021 05/30/2021 04/23/2021  Total Protein 6.0 - 8.5 g/dL 7.4 7.6 6.1(L)  Albumin 3.7 - 4.7 g/dL 4.3 3.6 2.5(L)  AST 0 - 40 IU/L 39 39 32  ALT 0 - 32 IU/L _0 Alk Phosphatase 44 - 121 IU/L 81 62 45  Total Bilirubin 0.0 - 1.2 mg/dL 0.3 0.6 0.6  Bilirubin, Direct 0.0 - 0.2 mg/dL - - -    Lab Results  Component Value Date/Time   TSH 8.995 (H) 05/30/2021 11:23 AM   TSH 1.188 04/18/2021 12:59 AM   TSH 5.510 (H) 12/20/2020 02:17 PM   TSH 1.740 09/09/2018 01:26 PM   FREET4 1.12 05/30/2021 11:23 AM   FREET4 1.51 12/20/2020 02:17 PM    CBC Latest Ref Rng & Units 04/23/2021 04/22/2021 04/21/2021  WBC 4.0 - 10.5 K/uL 9.0 8.7 10.1  Hemoglobin 12.0 - 15.0 g/dL 14.1 14.6 14.4  Hematocrit 36.0 - 46.0 % 42.9 45.1 45.1  Platelets 150 - 400 K/uL 240 179 213    Lab Results  Component Value Date/Time   VD25OH 4.3 (L) 09/09/2018 01:26 PM   VD25OH 22 (L) 11/18/2009 08:27 PM    Clinical ASCVD: Yes  The 10-year ASCVD risk score (Arnett DK, et al., 2019) is: 48.3%   Values used to calculate the score:     Age: 81 years     Sex: Female     Is Non-Hispanic African American: No     Diabetic: Yes     Tobacco smoker: Yes     Systolic Blood Pressure: 595 mmHg     Is BP treated: Yes     HDL Cholesterol: 34 mg/dL     Total Cholesterol: 186 mg/dL    Other: CHADS2VASc = 6  Social History   Tobacco Use  Smoking Status Every Day   Packs/day: 1.00   Years: 50.00   Pack years: 50.00   Types: Cigarettes   Last attempt to quit: 08/17/2020   Years since quitting: 1.0  Smokeless Tobacco Never  Tobacco Comments   Pt started smoking again   BP Readings from Last 3 Encounters:  06/24/21 110/60  06/17/21 110/70  05/30/21 104/70   Pulse Readings from Last 3  Encounters:  06/24/21 70  06/17/21 75  05/30/21 74   Wt Readings from Last 3 Encounters:  06/24/21 254 lb (115.2 kg)  06/17/21 268 lb 12.8 oz (121.9 kg)  05/30/21 268 lb 3.2 oz (121.7 kg)    Assessment: Review of patient past medical history, allergies, medications, health status, including review of consultants reports, laboratory and other test data, was performed as part of comprehensive evaluation and provision of chronic care management services.   SDOH:  (Social Determinants of Health) assessments and interventions performed:     CCM Care Plan  Allergies  Allergen Reactions   Neomycin-Bacitracin Zn-Polymyx Rash   Sulfonamide Derivatives Other (See Comments)    Stomach cramps   Ciprofloxacin Hives, Itching and Rash    Pt doesn't remember having any reaction to cipro   Spironolactone Rash    Medications Reviewed  Today     Reviewed by [name], RPH-CPP (Pharmacist) on 08/25/21 at 1125  Med List Status: <None>   Medication Order Taking? Sig Documenting Provider Last Dose Status Informant  acetaminophen (TYLENOL) 500 MG tablet [id number] Yes Take 500 mg by mouth every 6 (six) hours as needed for moderate pain or headache. [provider] Taking Active Self  albuterol (VENTOLIN HFA) 108 (90 Base) MCG/ACT inhaler [id number] Yes Inhale 2 puffs into the lungs every 4 (four) hours as needed for wheezing or shortness of breath. Geradine Girt, DO Taking Active   amiodarone (PACERONE) 200 MG tablet [id number] Yes Take 1 tablet (200 mg total) by mouth daily. Shirley Friar, PA-C Taking Active   apixaban (ELIQUIS) 5 MG TABS tablet [id number] Yes Take 1 tablet (5 mg total) by mouth 2 (two) times daily. Lyda Jester M, PA-C Taking Active Self  cyclobenzaprine (FLEXERIL) 10 MG tablet [id number] Yes TAKE 1 TABLET BY MOUTH THREE TIMES A DAY AS NEEDED FOR MUSCLE SPASMS Ann Held, DO Taking Active   eplerenone (INSPRA) 25 MG tablet [id number] Yes TAKE 1  TABLET (25 MG TOTAL) BY MOUTH DAILY. Larey Dresser, MD Taking Active Self  FARXIGA 10 MG TABS tablet [id number] Yes TAKE 1 TABLET BY MOUTH EVERY DAY BEFORE BREAKFAST Larey Dresser, MD Taking Active   fenofibrate (TRICOR) 145 MG tablet [id number] Yes TAKE 1 TABLET BY MOUTH EVERY DAY Larey Dresser, MD Taking Active   furosemide (LASIX) 40 MG tablet [id number] Yes Take 2 tablets (80 mg total) by mouth 2 (two) times daily. Larey Dresser, MD Taking Active Self  HUMALOG MIX 75/25 KWIKPEN (75-25) 100 UNIT/ML KwikPen [id number] Yes Inject 30-40 Units into the skin See admin instructions. Take 40 units in the AM, then take 30 units in the afternoon per patient [provider] Taking Active Self  icosapent Ethyl (VASCEPA) 1 g capsule [id number] Yes TAKE 2 CAPSULES BY MOUTH TWICE A DAY Larey Dresser, MD Taking Active   KLOR-CON M10 10 MEQ tablet [id number] Yes TAKE 2 TABLETS BY MOUTH 2 TIMES DAILY. Larey Dresser, MD Taking Active   LIVALO 4 MG TABS [id number] Yes TAKE 1 TABLET BY MOUTH EVERY DAY Larey Dresser, MD Taking Active   metoprolol succinate (TOPROL XL) 25 MG 24 hr tablet [id number] Yes Take 1 tablet (25 mg total) by mouth daily. Larey Dresser, MD Taking Active   Multiple Vitamin (MULTIVITAMIN WITH MINERALS) TABS tablet [id number] Yes Take 1 tablet by mouth daily. [provider] Taking Active Self  rOPINIRole (REQUIP) 0.25 MG tablet [id number] Yes Take 2 tablets (0.5 mg total) by mouth at bedtime. [name], Kendrick Fries R, DO Taking Active   sacubitril-valsartan (ENTRESTO) 49-51 MG [id number] Yes Take 1 tablet by mouth 2 (two) times daily. Larey Dresser, MD Taking Active Self  Semaglutide, 1 MG/DOSE, (OZEMPIC, 1 MG/DOSE,) 4 MG/3ML SOPN [id number] Yes Inject 1 mg into the skin once a week. Constance Haw, MD Taking Active             Patient Active Problem List   Diagnosis Date Noted   COVID-19 04/15/2021   Respiratory failure with hypoxia ([place]) 46/65/9935    Systolic CHF ([place]) 70/17/7939   CKD (chronic kidney disease) stage 3, GFR 30-59 ml/min ([place]) 04/15/2021   Elevated troponin 04/15/2021   Acute on chronic systolic (congestive) heart failure ([place]) 09/14/2020   Wide-complex tachycardia 09/07/2020   Ventricular tachyarrhythmia 09/07/2020   VT (ventricular tachycardia) 09/07/2020  Persistent atrial fibrillation ([place]) 04/30/2018   Wound of left leg 10/01/2017   Cellulitis of right lower extremity 11/27/2016   Encounter for assessment for deep vein thrombosis (DVT) 11/14/2016   Hypokalemia 08/13/2016   Constipation 08/13/2016   Type 2 diabetes mellitus with hyperglycemia, with long-term current use of insulin (HCC)    Atherosclerosis of aorta ([place]) 06/26/2016   Tobacco abuse 06/26/2016   Colitis 11/24/2014   Severe obesity (BMI >= 40) (HCC) 11/12/2013   PAF (paroxysmal atrial fibrillation) ([place]) 11/03/2013   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 10/28/2013   COLONIC POLYPS 08/16/2010   PAD (peripheral artery disease) ([place]) 08/15/2010   HLD (hyperlipidemia) 11/18/2009   MYALGIA 11/18/2009   OBESITY 05/14/2007   PULMONARY NODULE, RIGHT MIDDLE LOBE 05/14/2007   Obstructive sleep apnea 12/06/2006   Essential hypertension 12/06/2006   COPD (chronic obstructive pulmonary disease) ([place]) 12/06/2006    Immunization History  Administered Date(s) Administered   Influenza Whole 04/28/2009, 04/27/2010   Influenza, High Dose Seasonal PF 04/24/2014   Influenza-Unspecified 04/17/2015   Pneumococcal Conjugate-13 10/11/2015   Pneumococcal Polysaccharide-23 07/17/2002, 11/29/2011   Td 07/17/2002, 06/28/2018   Zoster, Live 11/11/2008    Conditions to be addressed/monitored: Atrial Fibrillation, CHF, CAD, HTN, HLD, Hypertriglyceridemia, COPD, and DMII  Care Plan : General Pharmacy (Adult)  Updates made by [name], RPH-CPP since 08/25/2021 12:00 AM     Problem: HTN; CHF; atrial fibrillation, Hyperlipidemia;  type 2 DM, insulin dependent;   Priority: High  Onset Date: 05/11/2021     Long-Range Goal: Provide education, support and care coordination for medication therapy and chronic conditions   Start Date: 05/11/2021  Expected End Date: 05/11/2022  Recent Progress: On track  Priority: High  Note:   Current Barriers:  Unable to independently afford treatment regimen Unable to achieve control of type 2 diabetes  Unable to maintain control of hypertension and hyperlipidemia   Pharmacist Clinical Goal(s):  Over the next 180 days, patient will verbalize ability to afford treatment regimen achieve control of type 2 diabetes as evidenced by A1c <8.0% (to start, will adjust goal as needed) maintain control of hypertension and hyperlipidemia  as evidenced by blood pressure <140/90 and LDL <70  through collaboration with PharmD and provider.   Interventions: 1:1 collaboration with [name], [name], DO regarding development and update of comprehensive plan of care as evidenced by provider attestation and co-signature Inter-disciplinary care team collaboration (see longitudinal plan of care) Comprehensive medication review performed; medication list updated in electronic medical record  Diabetes: Uncontrolled; Initial A1c goal <8.0% - will try to get <7.5% if able without increase in hypoglycemia Managed by endocrinologist - Dr [name] (last visit 07/01/2021) Current treatment: Humalog 75/25 Mix - inject 40 units each morning and 30 units each evening Farxiga 8m daily before breakfast Ozempic 0.550minjected subcutaneously once weekly; Will increase to 38m36meekly next week (08/30/2021) Past treatments: Invokamet; Lantus Ozempic was initiated by Dr McLAundra Dubinr DM, CHD and weight loss. Weight is down 14 lbs since 05/30/2021 Wt Readings from Last 3 Encounters:  06/24/21 254 lb (115.2 kg)  06/17/21 268 lb 12.8 oz (121.9 kg)  05/30/21 268 lb 3.2 oz (121.7 kg)  Current glucose readings:  AM: 110  to 140's PM: 150 to 160's Denies hypoglycemic/hyperglycemic symptoms (no blood glucose readings <100) At November 2022 appointment provided patient assistance program applications for Lily (Humalog) and [place] for FarWilder Glade last visit. She is out of coverage gap for  2022 but entered in March 2022. Patient has returned 2023 patient assistance program applications.   Interventions:  [place]ed [place]. Patient has been approved for [place] patient assistance program thru 07/16/2022  Also checked status of Ozempic patient assistance program - per automated system then need copy of patient's insurance card. Faxed to [employer].  Reviewed home blood glucose readings and reviewed goals  Fasting blood glucose goal (before meals) = 80 to 130 Blood glucose goal after a meal = less than 180   Hypertension / CHF: Controlled CHF  managed by Dr [name] with [facility] Current treatment: Farxiga 43m daily  Entresto 49-564m- take 1 tablet twice a day Inspra / Eplerenone 2573mnce a day Furosemide 77m50mtake 2 tablets = 80mg1mce a day Potassium 10 mEq - take 2 tablets twice a day  Metoprolol ER 25mg 63my (Restarted 05/30/2021 - in past had hypotension with beta blockers) Current home readings: Patient could not recall exact numbers and did not have record but states blood pressure never >140/90; Last BNP = 61.7 (04/23/2021); previous BNP was 106 (12/09/2020) EF = 55% (12/2017) Screened for patient assistance. Not needed for 2022 but reached coverage gap by March 2022. Provided medication assistance program applications for [place] at previous visit. Applications faxed 08/09/31/82/5053erventions:  Reviewed blood pressure and medications Called [place] patient [place]. Patient has been approved for FarxigIrannt assistance program thru 07/16/2022  Called [employer] - [place] has been reviewed and  program is requesting additional financial documents. Patient states she sent information by mail 08/17/2021. [facility] additional information has not been reviewed yet.  Continue to follow up with [facility]  Hyperlipidemia: Uncontrolled; LDL goal <70 Current treatment: Livalo 4mg da75m  Fenofibrate 145mg da18mVascepa / icosapent ethyl 1 gram - take 2 capsules twice a day Medications previously tried: pravastatin and rosuvastatin  Interventions:  Discussed LDL goals Screened for patient assistance for Livalo but patient's income did not meet program limit Continue current medications for lipid lowering. Recommend checking lipids with next labs.  Dicussed limiting saturated and trans fat in diet. (Addressed at previous visit)   Atrial Fibrillation: Controlled;  current rate/rhythm control Amiodarone 100mg dai24m takeing 0.5 tablet of 200mg dail34mose lowered 05/2021 per Dr McLean) anAundra Dubinulant treatment: Eliquis 5mg twice 32may CHADS2VASc score: 6 TSH was slightly elevated when checked 05/30/2021 but T4 and T3 were WNL. LFTS were WNL Patient reports she has had eye exam.  Intervention:  Reminded patient to monitor for signs and symptoms of bleeding Continue to monitor LFTs and thyroid function and get annual eye exam while taking amiodarone Will continue to follow in 2023 and provide medication assistance program application for Eliquis (cannot apply until reaches coverage gap)  Restless Leg Syndrome:  Improved; goal: decrease symptoms of leg pain / movement  Current therapy:  Ropinirole 0.25mg - take64mablet at bedtime for 3 to 4 nights, then increase to 2 tablets at night Started ropinirole 06/2021. Patient reports leg pain has improved significantly She endorses that she is only taking ropinirole as needed Intervention:  Reviewed directions for ropinirole with patient. Ok to take as needed if this is working for her but also discussed that she can take every  night at bedtime.   Health Maintenance:  Reviewed vaccination history and discussed benefits of COVID, influenza, Shingrix vaccines Patient declines to get COVID, influenza or  Shingrix vaccines at this time  Medication management Pharmacist Clinical Goal(s): Over the next 90 days, patient will work with PharmD and providers to maintain optimal medication adherence Current pharmacy: [facility] Comprehensive medication review performed. Reviewed refill history and assessed adherence Continue current medication management strategy  Patient Goals/Self-Care Activities Over the next 180 days, patient will:  take medications as prescribed, check blood pressure 3 to 4 times per week, document, and provide at future appointments, weigh daily, and contact provider if weight gain of more than 3 lbs in 24 hours or 5 lbs in 1 week, and collaborate with provider on medication access solutions  Follow Up Plan: Telephone follow up appointment with care management team member scheduled for:  1 month         Medication Assistance: Patient has returned applications for Humalog, [name] and Entresto patient assistance program. Applications faxed 13/14/3888.  She has been approved to receive Farxiga 53m [place] thru 07/16/2022. Entresto application needed additional financial information - patient provided; decision pending  Patient did not qualify for patient assistance program for Livalo due to income restrictions.  Checked Ozempic patient assistance program. Per automatic system application has not been processed due to needing additional information. States they need copy of her insurance card. [facility]   [facility]'s [facility]:  [id number] [address]: [phone]: [id number] [place]: [phone]: [phone] Follow Up:  Patient agrees to Care Plan and Follow-up.  Plan: Telephone follow up appointment with care management team member scheduled for:  2 to 3 weeks to check patient assistance program applications  TamCherre RobinsharmD Clinical Pharmacist [facility]

## 2021-08-25 NOTE — Patient Instructions (Signed)
Heidi Stark It was a pleasure speaking with you today.  I have attached a summary of our visit today and information about your health goals.   Our next appointment is by telephone on September 14, 2021 at 2:45pm (just a quick check in to review patient assistance progression and medications)   Please call the care guide team at [phone] if you need to cancel or reschedule your appointment.   If you have any questions or concerns, please feel free to contact me either at the phone number below or with a MyChart message.   Keep up the good work!  [name], PharmD Clinical Pharmacist [facility] [phone] (direct line)  [phone] (main office number)  Diabetes: Uncontrolled; Initial A1c goal <8.0% - will try to get <7.5% if able without increase in hypoglycemia  Lab Results  Component Value Date   HGBA1C 9.0 (H) 04/15/2021    Managed by endocrinologist - Dr [name] Current treatment: Humalog 75/25 Mix - inject 40 units each morning and 30 units each evening Farxiga 10mg  daily before breakfast Ozempic inject 0.5mg  weekly (increase as planned to 1mg  weekly) Interventions:  Reviewed home blood glucose readings and reviewed goals  Fasting blood glucose goal (before meals) = 80 to 130 Blood glucose goal after a meal = less than 180  Follow up with Dr [name] for adjustment in insulin therpay Approved for patient assistance program for [name] (received first shipment 67/1245) Application for Humalog 75/25 Mix still pending - sent additional information today. Expect to hear decision in 7 to 10 days.   Hypertension / CHF: BP Readings from Last 3 Encounters:  06/24/21 110/60  06/17/21 110/70  05/30/21 104/70    Current treatment: [name] 10mg  daily  Entresto 49-51mg  - take 1 tablet twice a day Inspra / Eplerenone 25mg  once a day Furosemide 40mg  - take 2 tablets = 80mg  twice a day Potassium 10 mEq - take 2 tablets twice a day   Metoprolol ER 25mg  daily  Interventions:  Reviewed blood pressure and medications Continue to follow up with [facility] Failure Clinic Approved for patient assistance program for [name] (received first shipment 80/9983) Application for Entresto patient assistance program pending - patient has submitted requested additional financial information   Hyperlipidemia: Uncontrolled; LDL goal <70  Lab Results  Component Value Date   CHOL 186 01/27/2021   HDL 34 (L) 01/27/2021   LDLCALC 94 01/27/2021   LDLDIRECT 42.0 11/30/2014   TRIG 292 (H) 01/27/2021   CHOLHDL 5.5 01/27/2021    Current treatment: Livalo 4mg  daily  Fenofibrate 145mg  daily Vascepa / icosapent ethyl 1 gram - take 2 capsules twice a day Interventions:  Discussed LDL goals Continue current medications for lipid lowering. Recommend checking lipids with next labs.  Discussed limiting saturated and trans fat in diet.   Atrial Fibrillation: current rate/rhythm control Amiodarone 100mg  (taking 0.5 tablet of 200mg ) daily (dose lowered 05/30/2021) anticoagulant treatment: Eliquis 5mg  twice a day Intervention:  Reminded patient to monitor for signs and symptoms of bleeding Monitor liver function and thyroid function and get annual eye exam while taking amiodarone Will continue to follow in 2023 and provide medication assistance program application for Eliquis (cannot apply until reaches coverage gap)  Medication management Pharmacist Clinical Goal(s): Over the next 90 days, patient will work with PharmD and providers to maintain optimal medication adherence Current pharmacy: [facility] Comprehensive medication review performed. Reviewed refill history and assessed adherence Continue current medication management strategy Patient was not aware the  antibiotic levofloxacin has been called into her pharmacy 2 days ago. Made her aware and she will pick up today.  Patient Goals/Self-Care Activities Over the next 180 days,  patient will:  take medications as prescribed, check blood pressure 3 to 4 times per week, document, and provide at future appointments, weigh daily, and contact provider if weight gain of more than 3 lbs in 24 hours or 5 lbs in 1 week, and collaborate with provider on medication access solutions  Follow Up Plan: Telephone follow up appointment with care management team member scheduled for:  1 month to check medication assistance program   Patient verbalizes understanding of instructions and care plan provided today and agrees to view in [place]. Active MyChart status confirmed with patient.

## 2021-08-31 NOTE — Progress Notes (Signed)
Advanced Heart Failure Clinic Note   PCP:  Ann Held, DO  HF Cardiologist: Dr. [name]  History of Present Illness: Heidi Stark is a 81 y.o. female who has history of HTN, DM, hyperlipidemia.  She was admitted in 4/15 with splenic and renal infarcts.  She was found to have paroxysmal atrial fibrillation.  In 4/15, she had fever and flank pain.  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  TEE showed prominent aortic plaque.  While she was in the hospital, she was noted to have a brief run of atrial fibrillation and was started on coumadin.     She has left leg pain with ambulation after about 100 feet, LE dopplers 5/15 showed occluded left external iliac artery.  This has been present ever since she had left iliac damage with a prior back surgery.     She was stable until 1/18, when she developed palpitations and exertional dyspnea.  She was admitted to [facility] with atrial fibrillation/RVR and acute systolic CHF.  EF was found to be 30-35%, down from 50-55% in 2015.  She was diuresed and underwent DCCV back to NSR.  She subsequently had atrial fibrillation ablation in 4/18.  She went back into atrial fibrillation in 5/19 and had DCCV back today NSR.    Echo was done 6/19 and reviewed, EF 55%, mild LVH, moderate diastolic dysfunction, normal RV size and systolic function.    With recurrent atrial fibrillation, she was admitted in 10/19 to start Tikosyn.  She was cardioverted back to NSR.   She had a recent admission in 2/22 with lightheadedness/presyncope that appeared to correlate with runs of VT.  She was on Tikosyn for atrial fibrillation though VT not thought to be due to Tikosyn (not polymorphic, QT not prolonged).  Tikosyn was stopped and amiodarone was started.  Echo in 2/22 showed EF 50-55% but cMRI (2/22) showed EF 40% with subendocardial scar in the basal to mid inferolateral wall, RV EF was 38%.  She had coronary angiography showing  no obstructive disease.  VT subsided and she was sent home on amiodarone.    09/13/20, she had 3 episodes of presyncope in succession while sitting at a desk.  No prodrome, each episode of severe lightheadedness lasted maybe 30 seconds.  She did not pass out. Because of these events, she came to the ER. She was admitted for observation and amiodarone gtt was restarted.  She had no VT overnight.  She remained in atypical atrial flutter today (was in atypical flutter at last admission). She had a single chamber Medtronic ICD placed 09/15/20. Discharge weight 256 lbs.   She had post hospital f/u on 09/22/20 and was noted to be back in atrial flutter w/ CVR. She was set up for repeat DCCV 3 weeks later. She underwent DCCV on 10/11/20 and converted back to NSR.   She had COVID-19 in 9/22.   Today she returns for HF follow up.Overall feeling ok. Has had some nausea but it comes and goes. Says this was associated with ozempic initiation. She has some SOB with exertion. Denies PND/Orthopnea. Appetite ok. Drinks extra fluid during the day. Limited mobility due to back pain. No fever or chills. She has not been weighing at home. Scale has not been working. Using CPAP every night. Taking all medications  MDT device interrogation: Fluid index well above threshold. No VT. Impedance down. Activity < 1 hour.   Labs (4/15): K  4.1, creatinine 0.7, HCT 46.2 Labs (7/15): K 3.9, creatinine 0.8, HCT 50 Labs (12/15): K 5, creatinine 0.7, HDL 33, LDL 57 Labs (5/16): K 4, creatinine 0.84, TGs 256, HDL 29, LDL 42, HCT 41.2 Labs (1/18): K 3.4, creatinine 0.78 Labs (2/18): K 4.4, creatinine 0.8, free T3/T4 normal, mildly elevated TSH, LFTs normal, hgb 15.6 Labs (4/18): K 4.2, creatinine 0.86, TSH elevated, free T3 and free T4 normal Labs (5/19): K 3.9, creatinine 0.78 Labs (6/19): K 4.4, creatinine 0.88 Labs (12/19): K 4.2, creatinine 0.85, TGs 322, LDL 77 Labs (2/20): LDL 46, TGs 362 Labs (3/20): K 4.7, creatinine 1.07 Labs  (11/20): TGs 282, LDL 92 Labs (3/22): K 4.1, creatinine 1.23 Labs (4/22): K 4.2, creatinine 1.2, LFTs normal, TSH mildly elevated, free T4 mildly elevated, normal T3.  Labs (6/22): TSH mildly elevated, free T3 and T4 normal Labs (7/22): TGs 292, LDL 94 Labs (10/22): K 3.8, creatinine 1.04, BNP 62, hgb 14.1, LFTs normal  PMH: 1.Type II diabetes 2. HTN 3. Hyperlipidemia 4. COPD: has quit smoking.  5. CAD: Coronary CTA (4/18) with nonobstructive coronary disease, calcium score 335 Agatston units (82nd percentile).  - Coronary angiography (2/22) with no obstructive disease 6. Paroxysmal atrial fibrillation: First noted in 4/15.  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   - Event monitor (4/15) with no atrial fibrillation.  - Admission with atrial fibrillation/RVR in 1/18.  - Atrial fibrillation ablation in 4/18.  - DCCV in 5/19.  - Tikosyn started + DCCV in 10/19. VT => Tikosyn stopped and amiodarone begun in 3/22.   - S/p single chamber MDT ICD 09/15/20 - DCCV to NSR in 3/22.  7. Chronic systolic CHF: ?tachycardia-mediated as first noted in setting of atrial fibrillation with RVR.  - TEE (4/15) with EF 50-55%, mild LVH, grade III-IV plaque in descending thoracic aorta, RV normal, no PFO, peak RV-RA gradient 42 mmHg.   - Echo (1/18) with EF 30-35%, diffuse hypokinesis, moderate LVH, mildly dilated RV, PASP 55 mmHg.  - Echo (4/18) with EF 50%, anterolateral/inferolateral mild hypokinesis, mild aortic stenosis, IVC normal.  - Echo (6/19) with EF 55%, mild LVH, moderate diastolic dysfunction, normal RV size and systolic function.  - cMRI (2/22) with EF 40% with subendocardial scar in the basal to mid inferolateral wall, RV EF was 38%. - Echo (2/22) with EF 50-55%, normal RV.  7. H/o diskectomy. 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 Lower extremity arterial dopplers (5/15) with occluded left external iliac artery.  - ABIs (5/18):  1.16 (normal) on right, 0.56 on left.  9. Aortic stenosis: Mild on 4/18 echo.  10. OSA: Uses CPAP 11. VT: 2/22.  No significant CAD on cath in 2/22.  cMRI with EF 40% with subendocardial scar in the basal to mid inferolateral wall, RV EF was 38%. - Medtronic ICD placed 3/22.  12. COVID-19 9/22  Current Outpatient Medications  Medication Sig Dispense Refill   acetaminophen (TYLENOL) 500 MG tablet Take 500 mg by mouth every 6 (six) hours as needed for moderate pain or headache.     albuterol (VENTOLIN HFA) 108 (90 Base) MCG/ACT inhaler Inhale 2 puffs into the lungs every 4 (four) hours as needed for wheezing or shortness of breath. 8 g 0   amiodarone (PACERONE) 200 MG tablet Take 1 tablet (200 mg total) by mouth daily. 90 tablet 3   apixaban (ELIQUIS) 5 MG TABS tablet Take 1 tablet (5 mg total) by mouth 2 (  two) times daily. 60 tablet 11   cyclobenzaprine (FLEXERIL) 10 MG tablet TAKE 1 TABLET BY MOUTH THREE TIMES A DAY AS NEEDED FOR MUSCLE SPASMS 30 tablet 0   eplerenone (INSPRA) 25 MG tablet TAKE 1 TABLET (25 MG TOTAL) BY MOUTH DAILY. 90 tablet 1   FARXIGA 10 MG TABS tablet TAKE 1 TABLET BY MOUTH EVERY DAY BEFORE BREAKFAST 30 tablet 6   fenofibrate (TRICOR) 145 MG tablet TAKE 1 TABLET BY MOUTH EVERY DAY 90 tablet 3   furosemide (LASIX) 40 MG tablet Take 2 tablets (80 mg total) by mouth 2 (two) times daily. 270 tablet 3   HUMALOG MIX 75/25 KWIKPEN (75-25) 100 UNIT/ML KwikPen Inject 30-40 Units into the skin See admin instructions. Take 40 units in the AM, then take 30 units in the afternoon per patient     icosapent Ethyl (VASCEPA) 1 g capsule TAKE 2 CAPSULES BY MOUTH TWICE A DAY 120 capsule 5   KLOR-CON M10 10 MEQ tablet TAKE 2 TABLETS BY MOUTH 2 TIMES DAILY. 360 tablet 3   LIVALO 4 MG TABS TAKE 1 TABLET BY MOUTH EVERY DAY 90 tablet 3   metoprolol succinate (TOPROL XL) 25 MG 24 hr tablet Take 1 tablet (25 mg total) by mouth daily. 30 tablet 6   Multiple Vitamin (MULTIVITAMIN WITH MINERALS) TABS  tablet Take 1 tablet by mouth daily.     rOPINIRole (REQUIP) 0.25 MG tablet Take 2 tablets (0.5 mg total) by mouth at bedtime. 180 tablet 0   sacubitril-valsartan (ENTRESTO) 49-51 MG Take 1 tablet by mouth 2 (two) times daily. 60 tablet 3   Semaglutide, 1 MG/DOSE, (OZEMPIC, 1 MG/DOSE,) 4 MG/3ML SOPN Inject 1 mg into the skin once a week. 3 mL 1   No current facility-administered medications for this encounter.    Allergies:   Neomycin-bacitracin zn-polymyx, Sulfonamide derivatives, Ciprofloxacin, and Spironolactone   Social History:  The patient  reports that she has been smoking cigarettes. She has a 50.00 pack-year smoking history. She has never used smokeless tobacco. She reports that she does not drink alcohol and does not use drugs.   Family History:  The patient's family history includes Cancer in her mother; Diabetes in her father, mother, and another family member; Factor V Leiden deficiency in her daughter; Hypertension in her mother; Melanoma in an other family member; Obesity in her father and mother.   ROS:  Please see the history of present illness.   All other systems are personally reviewed and negative.   Exam:   BP 104/60    Pulse (!) 56    Wt 124.8 kg (275 lb 3.2 oz)    SpO2 97%    BMI 44.42 kg/m  Wt Readings from Last 3 Encounters:  09/01/21 124.8 kg (275 lb 3.2 oz)  06/24/21 115.2 kg (254 lb)  06/17/21 121.9 kg (268 lb 12.8 oz)    PHYSICAL EXAM: General:  Arrived in a wheelchair. No resp difficulty HEENT: normal Neck: supple. JVP difficulty to assess.  Carotids 2+ bilat; no bruits. No lymphadenopathy or thryomegaly appreciated. Cor: PMI nondisplaced. Regular rate & rhythm. No rubs, gallops or murmurs. Lungs: clear Abdomen: obese, soft, nontender, nondistended. No hepatosplenomegaly. No bruits or masses. Good bowel sounds. Extremities: no cyanosis, clubbing, rash, edema Neuro: alert & orientedx3, cranial nerves grossly intact. moves all 4 extremities w/o  difficulty. Affect pleasant  Recent Labs: 04/23/2021: B Natriuretic Peptide 61.7; Hemoglobin 14.1; Platelets 240 05/30/2021: TSH 8.995 06/24/2021: ALT 20; BUN 28; Creatinine, Ser 1.31; Magnesium 2.5;  Potassium 4.1; Sodium 136  Personally reviewed   Wt Readings from Last 3 Encounters:  09/01/21 124.8 kg (275 lb 3.2 oz)  06/24/21 115.2 kg (254 lb)  06/17/21 121.9 kg (268 lb 12.8 oz)   ASSESSMENT AND PLAN:  1. H/o VT: noted 08/2020. 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 Had been on Tikosyn but this was not the typical Tikosyn-induced polymorphic VT and she had had no changes in Tikosyn dosing prior to occurrence. cMRI showed subendocardial LGE in the basal to mid inferolateral wall. This appeared to be a coronary disease pattern however coronary angiogram did not show significant coronary disease.  She was transitioned from Tikosyn to amiodarone and underwent Medtronic single chamber ICD on 09/15/20. No VT on interrogation. She is on amiodarone 100 daily.   2. Atrial fibrillation/flutter: Paroxysmal.  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 She was admitted in 1/18 with atrial fibrillation/RVR and CHF.  She was diuresed and had DCCV back to NSR. She had atrial fibrillation ablation in 4/18.  In 5/19, she went into atrial fibrillation transiently.  In 10/19, she was back in atrial fibrillation and was admitted for Tikosyn initiation and cardioversion.  Atypical atrial flutter noted in 12/21 with DCCV to NSR. As outlined above, now off Tikosyn and on amiodarone. Reverted back to rate controlled atypical atrial flutter at post hospital f/u 09/30/20. Underwent repeat DCCV in 3/22.  - Continue amiodarone to 100 mg daily.  - Continue Eliquis 5 mg bid.  - Check TSH next visit. 3. Claudication: Left leg claudication with absent left PT pulse.  Suspect this is related to prior damage to the iliac artery on that side, peripheral arterial dopplers in 5/15 and 5/18  confirmed this.   4. Chronic systolic => diastolic CHF: EF 96-78% on echo in 1/18, down from 50-55% in 4/15.  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 In 2/22, cMRI showed LV EF 40% with RV E 38%, inferolateral subendocardial LGE, but echo in 2/22 showed EF 50-55%.   NYHA III. Difficult to assess due to body habitus.  Optivol with fluid index well above threshold for the last 8 weeks. Continue lasix 80 mg twice a day and give 2.5 mg metolazone tomorrow with extra 40 meq of potassium. She will repeat metolazone and extra K on Sunday.  - Suspect this is due to increased fluid intake.    BMET today and next visit.   - Continue  Toprol XL 25 mg daily.   - Continue Entresto 49/51 mg bid.  - Continue eplerenone 25 mg daily.  - Continue dapagliflozin 10 mg daily.  - discussed low salt food choices and limiting fluid intake to < 2 liters per day. Needs weigh daily.  5. CAD: Coronary CTA in 2018 showed coronary calcium score in the 82nd percentile with nonobstructive coronary disease. Cath in 2/22 showed only luminal irregularities. She denies anginal symptoms  - not on ASA due to Eliquis.  - Continue Vascepa and Livalo.  6. COPD:  History of smoking. Quit 2/22.  7. LFY:BOFBPZWC nightly.  8. Obesity: On  semaglutide. Body mass index is 44.42 kg/m.  Follow up in 2 weeks to reassess volume status.     Jeanmarie Hubert, NP  09/01/2021  [place] [address] [facility] [address] [phone] (office) [phone] (fax)

## 2021-09-01 ENCOUNTER — Encounter (HOSPITAL_COMMUNITY): Payer: Self-pay

## 2021-09-01 ENCOUNTER — Ambulatory Visit (HOSPITAL_COMMUNITY)
Admission: RE | Admit: 2021-09-01 | Discharge: 2021-09-01 | Disposition: A | Discharge status: Home / Self Care | Payer: Medicare Other | Source: Ambulatory Visit | Attending: Adult Health | Admitting: Adult Health

## 2021-09-01 ENCOUNTER — Other Ambulatory Visit: Payer: Self-pay

## 2021-09-01 VITALS — BP 104/60 | HR 56 | Wt 275.2 lb

## 2021-09-01 DIAGNOSIS — I5022 Chronic systolic (congestive) heart failure: Secondary | ICD-10-CM

## 2021-09-01 DIAGNOSIS — G4733 Obstructive sleep apnea (adult) (pediatric): Secondary | ICD-10-CM | POA: Insufficient documentation

## 2021-09-01 DIAGNOSIS — Z7901 Long term (current) use of anticoagulants: Secondary | ICD-10-CM | POA: Insufficient documentation

## 2021-09-01 DIAGNOSIS — I5042 Chronic combined systolic (congestive) and diastolic (congestive) heart failure: Secondary | ICD-10-CM | POA: Diagnosis not present

## 2021-09-01 DIAGNOSIS — R55 Syncope and collapse: Secondary | ICD-10-CM | POA: Insufficient documentation

## 2021-09-01 DIAGNOSIS — E669 Obesity, unspecified: Secondary | ICD-10-CM | POA: Diagnosis not present

## 2021-09-01 DIAGNOSIS — F17210 Nicotine dependence, cigarettes, uncomplicated: Secondary | ICD-10-CM | POA: Insufficient documentation

## 2021-09-01 DIAGNOSIS — I110 Hypertensive heart disease with heart failure: Secondary | ICD-10-CM | POA: Diagnosis not present

## 2021-09-01 DIAGNOSIS — I480 Paroxysmal atrial fibrillation: Secondary | ICD-10-CM | POA: Diagnosis not present

## 2021-09-01 DIAGNOSIS — E1151 Type 2 diabetes mellitus with diabetic peripheral angiopathy without gangrene: Secondary | ICD-10-CM | POA: Insufficient documentation

## 2021-09-01 DIAGNOSIS — J449 Chronic obstructive pulmonary disease, unspecified: Secondary | ICD-10-CM | POA: Insufficient documentation

## 2021-09-01 DIAGNOSIS — I472 Ventricular tachycardia, unspecified: Secondary | ICD-10-CM | POA: Diagnosis not present

## 2021-09-01 DIAGNOSIS — I484 Atypical atrial flutter: Secondary | ICD-10-CM | POA: Diagnosis not present

## 2021-09-01 DIAGNOSIS — Z6841 Body Mass Index (BMI) 40.0 and over, adult: Secondary | ICD-10-CM | POA: Insufficient documentation

## 2021-09-01 DIAGNOSIS — I2510 Atherosclerotic heart disease of native coronary artery without angina pectoris: Secondary | ICD-10-CM | POA: Insufficient documentation

## 2021-09-01 DIAGNOSIS — Z79899 Other long term (current) drug therapy: Secondary | ICD-10-CM | POA: Diagnosis not present

## 2021-09-01 LAB — BASIC METABOLIC PANEL
Anion gap: 10 (ref 5–15)
BUN: 22 mg/dL (ref 8–23)
CO2: 27 mmol/L (ref 22–32)
Calcium: 8.6 mg/dL — ABNORMAL LOW (ref 8.9–10.3)
Chloride: 100 mmol/L (ref 98–111)
Creatinine, Ser: 1.38 mg/dL — ABNORMAL HIGH (ref 0.44–1.00)
GFR, Estimated: 39 mL/min — ABNORMAL LOW (ref >60)
Glucose, Bld: 126 mg/dL — ABNORMAL HIGH (ref 70–99)
Potassium: 4 mmol/L (ref 3.5–5.1)
Sodium: 137 mmol/L (ref 135–145)

## 2021-09-01 MED ORDER — METOLAZONE 2.5 MG PO TABS: 2.5000 mg | ORAL_TABLET | Freq: Every day | ORAL | 0 refills | Status: DC

## 2021-09-01 NOTE — Patient Instructions (Signed)
START Metolazone 2.5 mg, one tab daily on Friday and Sunday only with an additional 20 meq of potassium   Labs today We will only contact you if something comes back abnormal or we need to make some changes. Otherwise no news is good news!  Your physician recommends that you schedule a follow-up appointment in: 2 weeks  in the Advanced Practitioners (PA/NP) Clinic    Do the following things EVERYDAY: Weigh yourself in the morning before breakfast. Write it down and keep it in a log. Take your medicines as prescribed Eat low salt foods--Limit salt (sodium) to 2000 mg per day.  Stay as active as you can everyday Limit all fluids for the day to less than 2 liters  At the [facility], you and your health needs are our priority. As part of our continuing mission to provide you with exceptional heart care, we have created designated Provider Care Teams. These Care Teams include your primary Cardiologist (physician) and Advanced Practice Providers (APPs- Physician Assistants and Nurse Practitioners) who all work together to provide you with the care you need, when you need it.   You may see any of the following providers on your designated Care Team at your next follow up: Dr Glori Bickers Dr Haynes Kerns, NP Lyda Jester, [place] [facility] [place], [place] Audry Riles, PharmD   Please be sure to bring in all your medications bottles to every appointment.

## 2021-09-07 ENCOUNTER — Telehealth: Payer: Self-pay

## 2021-09-07 NOTE — Telephone Encounter (Signed)
MDT alert for VT/VF with therapy 2/21 @ 16:22, EGM shows sustained VT, [id number], ATP x2 slowing rate 16:25, sustained VT, rate 200 ATP delivered unsuccessfully x1 16:27 sustained VT, rate 273 falling in to the VF zone, ATPx1 unsuccessful, HV therapy 35J converting rhythm to VS/VP Optivol recently above threshold  Spoke with patient regarding episodes she stated that she just didn't feel good yesterday around this time, patient reported compliance with medications including  Amiodarone 200mg  daily  Informed patient of driving restrictions x 6 months, patient voiced understanding. Patient recently had BMP on 09/01/21.

## 2021-09-07 NOTE — Telephone Encounter (Signed)
Seeing EP tomorrow.  Make sure she has CHF clinic followup.

## 2021-09-08 ENCOUNTER — Ambulatory Visit: Payer: Medicare Other | Admitting: Cardiology

## 2021-09-08 ENCOUNTER — Other Ambulatory Visit: Payer: Self-pay

## 2021-09-08 ENCOUNTER — Encounter: Payer: Self-pay | Admitting: Cardiology

## 2021-09-08 VITALS — BP 106/70 | HR 59 | Ht 66.0 in | Wt 273.2 lb

## 2021-09-08 DIAGNOSIS — Z79899 Other long term (current) drug therapy: Secondary | ICD-10-CM | POA: Diagnosis not present

## 2021-09-08 DIAGNOSIS — I472 Ventricular tachycardia, unspecified: Secondary | ICD-10-CM

## 2021-09-08 DIAGNOSIS — I5022 Chronic systolic (congestive) heart failure: Secondary | ICD-10-CM

## 2021-09-08 DIAGNOSIS — I4819 Other persistent atrial fibrillation: Secondary | ICD-10-CM | POA: Diagnosis not present

## 2021-09-08 MED ORDER — MEXILETINE HCL 250 MG PO CAPS: 250.0000 mg | ORAL_CAPSULE | Freq: Two times a day (BID) | ORAL | 3 refills | Status: DC

## 2021-09-08 NOTE — Patient Instructions (Signed)
Medication Instructions:  Your physician has recommended you make the following change in your medication:  START Mexiletine 250 mg twice daily  *If you need a refill on your cardiac medications before your next appointment, please call your pharmacy*   Lab Work: Today: BMET, Magnesium, BNP If you have labs (blood work) drawn today and your tests are completely normal, you will receive your results only by: [place] (if you have MyChart) OR A paper copy in the mail If you have any lab test that is abnormal or we need to change your treatment, we will call you to review the results.   Testing/Procedures: None ordered   Follow-Up: At [facility], you and your health needs are our priority.  As part of our continuing mission to provide you with exceptional heart care, we have created designated Provider Care Teams.  These Care Teams include your primary Cardiologist (physician) and Advanced Practice Providers (APPs -  Physician Assistants and Nurse Practitioners) who all work together to provide you with the care you need, when you need it.  Remote monitoring is used to monitor your Pacemaker or ICD from home. This monitoring reduces the number of office visits required to check your device to one time per year. It allows [name] to keep an eye on the functioning of your device to ensure it is working properly. You are scheduled for a device check from home on 09/15/2021. You may send your transmission at any time that day. If you have a wireless device, the transmission will be sent automatically. After your physician reviews your transmission, you will receive a postcard with your next transmission date.  Your next appointment:   3 month(s)  The format for your next appointment:   In Person  Provider:   Allegra Lai, MD   Thank you for choosing [facility]!!   Trinidad Curet, RN [phone]    Other Instructions   Mexiletine capsules What is this  medication? MEXILETINE (mex [place] e teen) is an antiarrhythmic agent. This medicine is used to treat irregular heart rhythm and can slow rapid heartbeats. It can help your heart to return to and maintain a normal rhythm. Because of the side effects caused by this medicine, it is usually used for heartbeat problems that may be life-threatening. This medicine may be used for other purposes; ask your health care provider or pharmacist if you have questions. COMMON BRAND NAME(S): Mexitil What should I tell my care team before I take this medication? They need to know if you have any of these conditions: liver disease other heart problems previous heart attack an unusual or allergic reaction to mexiletine, other medicines, foods, dyes, or preservatives pregnant or trying to get pregnant breast-feeding How should I use this medication? Take this medicine by mouth with a glass of water. Follow the directions on the prescription label. It is recommended that you take this medicine with food or an antacid. Take your doses at regular intervals. Do not take your medicine more often than directed. Do not stop taking except on the advice of your doctor or health care professional. Talk to your pediatrician regarding the use of this medicine in children. Special care may be needed. Overdosage: If you think you have taken too much of this medicine contact a poison control center or emergency room at once. NOTE: This medicine is only for you. Do not share this medicine with others. What if I miss a dose? If you miss a dose, take it as  soon as you can. If it is almost time for your next dose, take only that dose. Do not take double or extra doses. What may interact with this medication? Do not take this medicine with any of the following medications: dofetilide This medicine may also interact with the following medications: caffeine cimetidine medicines for depression, anxiety, or psychotic  disturbances medicines to control heart rhythm phenobarbital phenytoin rifampin theophylline This list may not describe all possible interactions. Give your health care provider a list of all the medicines, herbs, non-prescription drugs, or dietary supplements you use. Also tell them if you smoke, drink alcohol, or use illegal drugs. Some items may interact with your medicine. What should I watch for while using this medication? Your condition will be monitored closely when you first begin therapy. Often, this drug is first started in a hospital or other monitored health care setting. Once you are on maintenance therapy, visit your doctor or health care provider for regular checks on your progress. Because your condition and use of this medicine carry some risk, it is a good idea to carry an identification card, necklace or bracelet with details of your condition, medications, and doctor or health care provider. You may get drowsy or dizzy. Do not drive, use machinery, or do anything that needs mental alertness until you know how this medicine affects you. Do not stand or sit up quickly, especially if you are an older patient. This reduces the risk of dizzy or fainting spells. Alcohol can make you more dizzy, increase flushing and rapid heartbeats. Avoid alcoholic drinks. This medicine may cause serious skin reactions. They can happen weeks to months after starting the medicine. Contact your health care provider right away if you notice fevers or flu-like symptoms with a rash. The rash may be red or purple and then turn into blisters or peeling of the skin. Or, you might notice a red rash with swelling of the face, lips or lymph nodes in your neck or under your arms. What side effects may I notice from receiving this medication? Side effects that you should report to your doctor or health care professional as soon as possible: allergic reactions like skin rash, itching or hives, swelling of the face,  lips, or tongue breathing problems chest pain, continued irregular heartbeats rash, fever, and swollen lymph nodes redness, blistering, peeling or loosening of the skin, including inside the mouth seizures skin rash trembling, shaking unusual bleeding or bruising unusually weak or tired Side effects that usually do not require medical attention (report to your doctor or health care professional if they continue or are bothersome): blurred vision difficulty walking heartburn nausea, vomiting nervousness numbness, or tingling in the fingers or toes This list may not describe all possible side effects. Call your doctor for medical advice about side effects. You may report side effects to FDA at 1-800-FDA-1088. Where should I keep my medication? Keep out of reach of children. Store at room temperature between 15 and 30 degrees C (59 and 86 degrees F). Throw away any unused medicine after the expiration date. NOTE: This sheet is a summary. It may not cover all possible information. If you have questions about this medicine, talk to your doctor, pharmacist, or health care provider.  2022 [employer] Standard (2018-10-10 00:00:00)

## 2021-09-08 NOTE — Progress Notes (Signed)
Electrophysiology Office Note   Date:  09/08/2021   ID:  Heidi, Stark [birth year], MRN [id number]  PCP:  [name], [name], DO  Cardiologist:  [name] Primary Electrophysiologist:  Dr [name]  CC: Follow up for atrial fibrillation   History of Present Illness: Heidi Stark is a 81 y.o. female who is being seen today for the evaluation of atrial fibrillatoin at the request of [name], [name], *. Presenting today for electrophysiology evaluation.   She has a history of a diastolic heart failure, COPD, DVT, hypertension, hyperlipidemia, OSA on CPAP, atrial fibrillation, and diabetes.  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  She was started on Coumadin.  She was also noted to have a left leg pain and was found to have an occluded left internal iliac.  January 2018 she developed exertional dyspnea.  She was found to be in atrial fibrillation with heart failure.  She is status post ablation April 2018.  She was seen in clinic in 2019 and was found to be in atrial fibrillation and was cardioverted.  She is status post admission for dofetilide loading 04/30/2018.   Today, denies symptoms of palpitations, chest pain, shortness of breath, orthopnea, PND, lower extremity edema, claudication, dizziness, presyncope, syncope, bleeding, or neurologic sequela. The patient is tolerating medications without difficulties.  She presents today complaining of mild shortness of breath.  She is unclear as to how long her shortness of breath has been occurring.  She has no chest pain.  She does continue to smoke, up to a pack a day.  She was initially on Chantix, but this has been recalled.   Past Medical History:  Diagnosis Date   A-fib [facility])    Arthritis    "hands, wrists" (11/07/2016)   CHF (congestive heart failure) (HCC)    Chronic lower back pain    Constipation    COPD (chronic obstructive pulmonary disease) ([place])    DVT  (deep venous thrombosis) (HCC)    Dyspnea    History of blood transfusion 1990   "related to OR"   History of kidney stones    Hyperlipidemia    Hypertension    Joint pain    Lower extremity edema    OSA on CPAP    Persistent atrial fibrillation ([place])    Pneumonia    "several times" (11/07/2016)   Type II diabetes mellitus ([place])    Past Surgical History:  Procedure Laterality Date   ATRIAL FIBRILLATION ABLATION N/A 11/07/2016   Procedure: Atrial Fibrillation Ablation;  Surgeon: Sianne Tejada [name], MD;  Location: [place] [facility];  Service: Cardiovascular;  Laterality: N/A;   BACK SURGERY     CARDIAC CATHETERIZATION     CARDIOVERSION N/A 08/11/2016   Procedure: CARDIOVERSION;  Surgeon: Larey Dresser, MD;  Location: [place];  Service: Cardiovascular;  Laterality: N/A;   CARDIOVERSION N/A 12/03/2017   Procedure: CARDIOVERSION;  Surgeon: Larey Dresser, MD;  Location: [facility] ENDOSCOPY;  Service: Cardiovascular;  Laterality: N/A;   CARDIOVERSION N/A 05/02/2018   Procedure: CARDIOVERSION;  Surgeon: Pixie Casino, MD;  Location: [facility] ENDOSCOPY;  Service: Cardiovascular;  Laterality: N/A;   CARDIOVERSION N/A 06/23/2020   Procedure: CARDIOVERSION;  Surgeon: [name], MD;  Location: [facility] ENDOSCOPY;  Service: Cardiovascular;  Laterality: N/A;   CARDIOVERSION N/A 10/11/2020   Procedure: CARDIOVERSION;  Surgeon: Larey Dresser, MD;  Location: [facility] ENDOSCOPY;  Service: Cardiovascular;  Laterality: N/A;  CARPAL TUNNEL RELEASE     CATARACT EXTRACTION W/ INTRAOCULAR LENS  IMPLANT, BILATERAL Bilateral    COLON SURGERY  1990   vein graft and colon repair after nicked artery with back surgert   COLONOSCOPY     CORONARY ANGIOGRAPHY N/A 09/08/2020   Procedure: CORONARY ANGIOGRAPHY;  Surgeon: Larey Dresser, MD;  Location: [place] [facility];  Service: Cardiovascular;  Laterality: N/A;   CYSTECTOMY     between bladder and kidneys   GANGLION CYST EXCISION Left    ICD IMPLANT N/A  09/15/2020   Procedure: ICD IMPLANT;  Surgeon: Constance Haw, MD;  Location: [place] [facility];  Service: Cardiovascular;  Laterality: N/A;   KNEE CARTILAGE SURGERY Right 1960s   LUMBAR [place] ILIAC ARTERY  1990   vein graft and colon repair after nicked artery with back surgert   TEE WITHOUT CARDIOVERSION N/A 10/31/2013   Procedure: TRANSESOPHAGEAL ECHOCARDIOGRAM (TEE);  Surgeon: Larey Dresser, MD;  Location: [facility] ENDOSCOPY;  Service: Cardiovascular;  Laterality: N/A;   TUBAL LIGATION       Current Outpatient Medications  Medication Sig Dispense Refill   acetaminophen (TYLENOL) 500 MG tablet Take 500 mg by mouth every 6 (six) hours as needed for moderate pain or headache.     albuterol (VENTOLIN HFA) 108 (90 Base) MCG/ACT inhaler Inhale 2 puffs into the lungs every 4 (four) hours as needed for wheezing or shortness of breath. 8 g 0   amiodarone (PACERONE) 200 MG tablet Take 1 tablet (200 mg total) by mouth daily. 90 tablet 3   apixaban (ELIQUIS) 5 MG TABS tablet Take 1 tablet (5 mg total) by mouth 2 (two) times daily. 60 tablet 11   eplerenone (INSPRA) 25 MG tablet TAKE 1 TABLET (25 MG TOTAL) BY MOUTH DAILY. 90 tablet 1   FARXIGA 10 MG TABS tablet TAKE 1 TABLET BY MOUTH EVERY DAY BEFORE BREAKFAST 30 tablet 6   fenofibrate (TRICOR) 145 MG tablet TAKE 1 TABLET BY MOUTH EVERY DAY 90 tablet 3   furosemide (LASIX) 40 MG tablet Take 2 tablets (80 mg total) by mouth 2 (two) times daily. 270 tablet 3   HUMALOG MIX 75/25 KWIKPEN (75-25) 100 UNIT/ML KwikPen Inject 30-40 Units into the skin See admin instructions. Take 40 units in the AM, then take 30 units in the afternoon per patient     icosapent Ethyl (VASCEPA) 1 g capsule TAKE 2 CAPSULES BY MOUTH TWICE A DAY 120 capsule 5   KLOR-CON M10 10 MEQ tablet TAKE 2 TABLETS BY MOUTH 2 TIMES DAILY. 360 tablet 3   LIVALO 4 MG TABS TAKE 1 TABLET BY MOUTH EVERY DAY 90 tablet 3   metolazone (ZAROXOLYN) 2.5 MG tablet Take 1 tablet  (2.5 mg total) by mouth daily. On Friday and Sunday 2 tablet 0   metoprolol succinate (TOPROL XL) 25 MG 24 hr tablet Take 1 tablet (25 mg total) by mouth daily. 30 tablet 6   mexiletine (MEXITIL) 250 MG capsule Take 1 capsule (250 mg total) by mouth 2 (two) times daily. 60 capsule 3   Multiple Vitamin (MULTIVITAMIN WITH MINERALS) TABS tablet Take 1 tablet by mouth daily.     rOPINIRole (REQUIP) 0.25 MG tablet Take 2 tablets (0.5 mg total) by mouth at bedtime. 180 tablet 0   sacubitril-valsartan (ENTRESTO) 49-51 MG Take 1 tablet by mouth 2 (two) times daily. 60 tablet 3   Semaglutide, 1 MG/DOSE, (OZEMPIC, 1 MG/DOSE,) 4 MG/3ML SOPN Inject 1  mg into the skin once a week. 3 mL 1   No current facility-administered medications for this visit.    Allergies:   Neomycin-bacitracin zn-polymyx, Sulfonamide derivatives, Ciprofloxacin, and Spironolactone   Social History:  The patient  reports that she has been smoking cigarettes. She has a 50.00 pack-year smoking history. She has never used smokeless tobacco. She reports that she does not drink alcohol and does not use drugs.   Family History:  The patient's family history includes Cancer in her mother; Diabetes in her father, mother, and another family member; Factor V Leiden deficiency in her daughter; Hypertension in her mother; Melanoma in an other family member; Obesity in her father and mother.   ROS:  Please see the history of present illness.   Otherwise, review of systems is positive for none.   All other systems are reviewed and negative.   PHYSICAL EXAM: VS:  BP 106/70    Pulse (!) 59    Ht 5' 6"  (1.676 m)    Wt 273 lb 3.2 oz (123.9 kg)    SpO2 92%    BMI 44.10 kg/m  , BMI Body mass index is 44.1 kg/m. GEN: Well nourished, well developed, in no acute distress  HEENT: normal  Neck: no JVD, carotid bruits, or masses Cardiac: RRR; no murmurs, rubs, or gallops,no edema  Respiratory:  clear to auscultation bilaterally, normal work of  breathing GI: soft, nontender, nondistended, + BS MS: no deformity or atrophy  Skin: warm and dry Neuro:  Strength and sensation are intact Psych: euthymic mood, full affect  EKG:  EKG is ordered today. Personal review of the ekg ordered shows atrial flutter, rate 122  Recent Labs: 04/23/2021: B Natriuretic Peptide 61.7; Hemoglobin 14.1; Platelets 240 05/30/2021: TSH 8.995 06/24/2021: ALT 20; Magnesium 2.5 09/01/2021: BUN 22; Creatinine, Ser 1.38; Potassium 4.0; Sodium 137    Lipid Panel     Component Value Date/Time   CHOL 186 01/27/2021 1146   CHOL 170 06/02/2019 0913   TRIG 292 (H) 01/27/2021 1146   HDL 34 (L) 01/27/2021 1146   HDL 31 (L) 06/02/2019 0913   CHOLHDL 5.5 01/27/2021 1146   VLDL 58 (H) 01/27/2021 1146   LDLCALC 94 01/27/2021 1146   LDLCALC 92 06/02/2019 0913   LDLCALC 77 06/28/2018 1410   LDLDIRECT 42.0 11/30/2014 1226     Wt Readings from Last 3 Encounters:  09/08/21 273 lb 3.2 oz (123.9 kg)  09/01/21 275 lb 3.2 oz (124.8 kg)  06/24/21 254 lb (115.2 kg)      Other studies Reviewed: Additional studies/ records that were reviewed today include: TTE 12/21/17  Review of the above records today demonstrates:  - Left ventricle: The cavity size was normal. Wall thickness was   increased in a pattern of mild LVH. The estimated ejection   fraction was 55%. Although no diagnostic regional wall motion   abnormality was identified, this possibility cannot be completely   excluded on the basis of this study. Features are consistent with   a pseudonormal left ventricular filling pattern, with concomitant   abnormal relaxation and increased filling pressure (grade 2   diastolic dysfunction). - Aortic valve: There was no stenosis. - Mitral valve: Moderately fibrotic annulus. There was no   significant regurgitation. - Left atrium: The atrium was moderately dilated. - Right ventricle: The cavity size was normal. Systolic function   was normal. - Right atrium: The  atrium was mildly dilated. - Tricuspid valve: Peak RV-RA gradient (S): 16 mm Hg. -  Pulmonary arteries: PA peak pressure: 19 mm Hg (S). - Inferior vena cava: The vessel was normal in size. The   respirophasic diameter changes were in the normal range (>= 50%),   consistent with normal central venous pressure.   ASSESSMENT AND PLAN:  1.  Persistent atrial fibrillation/atrial flutter: Currently on Eliquis and dofetilide, monitoring for high risk medication.  CHA2DS2-VASc of 6.  Unfortunately she is back in atrial flutter today.  She does have symptoms of shortness of breath.  We Shandelle Borrelli plan for cardioversion.  2.  Chronic systolic heart failure: Ejection fraction has improved.  Currently on Toprol-XL, eplerenone, Lasix, Entresto.    3.  Coronary artery disease: Calcium score in the 82nd percentile.  Continue Crestor.  4.  COPD: Advised to quit smoking  5.  Obstructive sleep apnea: CPAP compliance encouraged   Current medicines are reviewed at length with the patient today.   The patient does not have concerns regarding her medicines.  The following changes were made today: none  Labs/ tests ordered today include:  Orders Placed This Encounter  Procedures   Basic metabolic panel   Magnesium   Pro b natriuretic peptide (BNP)   EKG 12-Lead     Disposition:   FU with Cyncere Ruhe 3 months  Signed, Arieona Swaggerty [name], MD  09/08/2021 10:46 AM     [facility] [address] [place] [address] [phone] (office) [phone] (fax)    Electrophysiology Office Note   Date:  09/08/2021   ID:  Caya, Soberanis 1940/09/11, MRN [id number]  PCP:  Ann Held, DO  Cardiologist:  [name] Primary Electrophysiologist:  Dr [name]  CC: Follow up for atrial fibrillation   History of Present Illness: Heidi Stark is a 80 y.o. female who is being seen today for the evaluation of atrial fibrillatoin at the request of [name], [name], *.  Presenting today for electrophysiology evaluation.    She has a history significant for diastolic heart failure, COPD, DVT, hypertension, hyperlipidemia, OSA on CPAP, atrial fibrillation, diabetes.  She wears oxygen at night.  She is status post atrial fibrillation ablation in 2018.  She was hospitalized 09/12/2020 with 3 episodes of presyncope.  She was started on amiodarone for ventricular tachycardia and is now status post Medtronic ICD.  On 09/06/2021, she had multiple episodes of ventricular tachycardia.  She received ATP which slowed her rate, but did receive ICD shocks.  On that day, she felt poorly.  She does not remember her ICD shock.  She feels well today.  She was seen by heart failure cardiology and was prescribed metolazone, but does not feel that she was urinating significantly with this.  Her OptiVol was elevated when she saw heart failure cardiology but has now normalized.  Today, denies symptoms of palpitations, chest pain, shortness of breath, orthopnea, PND, lower extremity edema, claudication, dizziness, presyncope, syncope, bleeding, or neurologic sequela. The patient is tolerating medications without difficulties.     Past Medical History:  Diagnosis Date   A-fib [facility])    Arthritis    "hands, wrists" (11/07/2016)   CHF (congestive heart failure) (HCC)    Chronic lower back pain    Constipation    COPD (chronic obstructive pulmonary disease) ([place])    DVT (deep venous thrombosis) ([place])    Dyspnea    History of blood transfusion 1990   "related to OR"   History of kidney stones    Hyperlipidemia    Hypertension  Joint pain    Lower extremity edema    OSA on CPAP    Persistent atrial fibrillation ([place])    Pneumonia    "several times" (11/07/2016)   Type II diabetes mellitus ([place])    Past Surgical History:  Procedure Laterality Date   ATRIAL FIBRILLATION ABLATION N/A 11/07/2016   Procedure: Atrial Fibrillation Ablation;  Surgeon: Gearlene Godsil [name], MD;  Location:  [place] [facility];  Service: Cardiovascular;  Laterality: N/A;   BACK SURGERY     CARDIAC CATHETERIZATION     CARDIOVERSION N/A 08/11/2016   Procedure: CARDIOVERSION;  Surgeon: Larey Dresser, MD;  Location: [place];  Service: Cardiovascular;  Laterality: N/A;   CARDIOVERSION N/A 12/03/2017   Procedure: CARDIOVERSION;  Surgeon: Larey Dresser, MD;  Location: [facility] ENDOSCOPY;  Service: Cardiovascular;  Laterality: N/A;   CARDIOVERSION N/A 05/02/2018   Procedure: CARDIOVERSION;  Surgeon: Pixie Casino, MD;  Location: [facility] ENDOSCOPY;  Service: Cardiovascular;  Laterality: N/A;   CARDIOVERSION N/A 06/23/2020   Procedure: CARDIOVERSION;  Surgeon: [name], MD;  Location: [facility] ENDOSCOPY;  Service: Cardiovascular;  Laterality: N/A;   CARDIOVERSION N/A 10/11/2020   Procedure: CARDIOVERSION;  Surgeon: Larey Dresser, MD;  Location: [place];  Service: Cardiovascular;  Laterality: N/A;   CARPAL TUNNEL RELEASE     CATARACT EXTRACTION W/ INTRAOCULAR LENS  IMPLANT, BILATERAL Bilateral    COLON SURGERY  1990   vein graft and colon repair after nicked artery with back surgert   COLONOSCOPY     CORONARY ANGIOGRAPHY N/A 09/08/2020   Procedure: CORONARY ANGIOGRAPHY;  Surgeon: Larey Dresser, MD;  Location: [place] [facility];  Service: Cardiovascular;  Laterality: N/A;   CYSTECTOMY     between bladder and kidneys   GANGLION CYST EXCISION Left    ICD IMPLANT N/A 09/15/2020   Procedure: ICD IMPLANT;  Surgeon: Constance Haw, MD;  Location: [place] [facility];  Service: Cardiovascular;  Laterality: N/A;   KNEE CARTILAGE SURGERY Right 1960s   LUMBAR [place] ILIAC ARTERY  1990   vein graft and colon repair after nicked artery with back surgert   TEE WITHOUT CARDIOVERSION N/A 10/31/2013   Procedure: TRANSESOPHAGEAL ECHOCARDIOGRAM (TEE);  Surgeon: Larey Dresser, MD;  Location: [facility] ENDOSCOPY;  Service: Cardiovascular;  Laterality: N/A;   TUBAL LIGATION        Current Outpatient Medications  Medication Sig Dispense Refill   acetaminophen (TYLENOL) 500 MG tablet Take 500 mg by mouth every 6 (six) hours as needed for moderate pain or headache.     albuterol (VENTOLIN HFA) 108 (90 Base) MCG/ACT inhaler Inhale 2 puffs into the lungs every 4 (four) hours as needed for wheezing or shortness of breath. 8 g 0   amiodarone (PACERONE) 200 MG tablet Take 1 tablet (200 mg total) by mouth daily. 90 tablet 3   apixaban (ELIQUIS) 5 MG TABS tablet Take 1 tablet (5 mg total) by mouth 2 (two) times daily. 60 tablet 11   eplerenone (INSPRA) 25 MG tablet TAKE 1 TABLET (25 MG TOTAL) BY MOUTH DAILY. 90 tablet 1   FARXIGA 10 MG TABS tablet TAKE 1 TABLET BY MOUTH EVERY DAY BEFORE BREAKFAST 30 tablet 6   fenofibrate (TRICOR) 145 MG tablet TAKE 1 TABLET BY MOUTH EVERY DAY 90 tablet 3   furosemide (LASIX) 40 MG tablet Take 2 tablets (80 mg total) by mouth 2 (two) times daily. 270 tablet 3   HUMALOG MIX 75/25 KWIKPEN (75-25)  100 UNIT/ML KwikPen Inject 30-40 Units into the skin See admin instructions. Take 40 units in the AM, then take 30 units in the afternoon per patient     icosapent Ethyl (VASCEPA) 1 g capsule TAKE 2 CAPSULES BY MOUTH TWICE A DAY 120 capsule 5   KLOR-CON M10 10 MEQ tablet TAKE 2 TABLETS BY MOUTH 2 TIMES DAILY. 360 tablet 3   LIVALO 4 MG TABS TAKE 1 TABLET BY MOUTH EVERY DAY 90 tablet 3   metolazone (ZAROXOLYN) 2.5 MG tablet Take 1 tablet (2.5 mg total) by mouth daily. On Friday and Sunday 2 tablet 0   metoprolol succinate (TOPROL XL) 25 MG 24 hr tablet Take 1 tablet (25 mg total) by mouth daily. 30 tablet 6   mexiletine (MEXITIL) 250 MG capsule Take 1 capsule (250 mg total) by mouth 2 (two) times daily. 60 capsule 3   Multiple Vitamin (MULTIVITAMIN WITH MINERALS) TABS tablet Take 1 tablet by mouth daily.     rOPINIRole (REQUIP) 0.25 MG tablet Take 2 tablets (0.5 mg total) by mouth at bedtime. 180 tablet 0   sacubitril-valsartan (ENTRESTO) 49-51 MG Take  1 tablet by mouth 2 (two) times daily. 60 tablet 3   Semaglutide, 1 MG/DOSE, (OZEMPIC, 1 MG/DOSE,) 4 MG/3ML SOPN Inject 1 mg into the skin once a week. 3 mL 1   No current facility-administered medications for this visit.    Allergies:   Neomycin-bacitracin zn-polymyx, Sulfonamide derivatives, Ciprofloxacin, and Spironolactone   Social History:  The patient  reports that she has been smoking cigarettes. She has a 50.00 pack-year smoking history. She has never used smokeless tobacco. She reports that she does not drink alcohol and does not use drugs.   Family History:  The patient's family history includes Cancer in her mother; Diabetes in her father, mother, and another family member; Factor V Leiden deficiency in her daughter; Hypertension in her mother; Melanoma in an other family member; Obesity in her father and mother.   ROS:  Please see the history of present illness.   Otherwise, review of systems is positive for none.   All other systems are reviewed and negative.   PHYSICAL EXAM: VS:  BP 106/70    Pulse (!) 59    Ht 5' 6"  (1.676 m)    Wt 273 lb 3.2 oz (123.9 kg)    SpO2 92%    BMI 44.10 kg/m  , BMI Body mass index is 44.1 kg/m. GEN: Well nourished, well developed, in no acute distress  HEENT: normal  Neck: no JVD, carotid bruits, or masses Cardiac: RRR; no murmurs, rubs, or gallops,no edema  Respiratory:  clear to auscultation bilaterally, normal work of breathing GI: soft, nontender, nondistended, + BS MS: no deformity or atrophy  Skin: warm and dry, device site well healed Neuro:  Strength and sensation are intact Psych: euthymic mood, full affect  EKG:  EKG is ordered today. Personal review of the ekg ordered shows sinus rhythm, ventricular paced, rate 59  Personal review of the device interrogation today. Results in [place]: 04/23/2021: B Natriuretic Peptide 61.7; Hemoglobin 14.1; Platelets 240 05/30/2021: TSH 8.995 06/24/2021: ALT 20; Magnesium  2.5 09/01/2021: BUN 22; Creatinine, Ser 1.38; Potassium 4.0; Sodium 137    Lipid Panel     Component Value Date/Time   CHOL 186 01/27/2021 1146   CHOL 170 06/02/2019 0913   TRIG 292 (H) 01/27/2021 1146   HDL 34 (L) 01/27/2021 1146   HDL 31 (L) 06/02/2019 0913  CHOLHDL 5.5 01/27/2021 1146   VLDL 58 (H) 01/27/2021 1146   LDLCALC 94 01/27/2021 1146   LDLCALC 92 06/02/2019 0913   LDLCALC 77 06/28/2018 1410   LDLDIRECT 42.0 11/30/2014 1226     Wt Readings from Last 3 Encounters:  09/08/21 273 lb 3.2 oz (123.9 kg)  09/01/21 275 lb 3.2 oz (124.8 kg)  06/24/21 254 lb (115.2 kg)      Other studies Reviewed: Additional studies/ records that were reviewed today include: TTE 12/21/17  Review of the above records today demonstrates:  - Left ventricle: The cavity size was normal. Wall thickness was   increased in a pattern of mild LVH. The estimated ejection   fraction was 55%. Although no diagnostic regional wall motion   abnormality was identified, this possibility cannot be completely   excluded on the basis of this study. Features are consistent with   a pseudonormal left ventricular filling pattern, with concomitant   abnormal relaxation and increased filling pressure (grade 2   diastolic dysfunction). - Aortic valve: There was no stenosis. - Mitral valve: Moderately fibrotic annulus. There was no   significant regurgitation. - Left atrium: The atrium was moderately dilated. - Right ventricle: The cavity size was normal. Systolic function   was normal. - Right atrium: The atrium was mildly dilated. - Tricuspid valve: Peak RV-RA gradient (S): 16 mm Hg. - Pulmonary arteries: PA peak pressure: 19 mm Hg (S). - Inferior vena cava: The vessel was normal in size. The   respirophasic diameter changes were in the normal range (>= 50%),   consistent with normal central venous pressure.   ASSESSMENT AND PLAN:  1.  Paroxysmal atrial fibrillation: Currently on Eliquis and amiodarone.   CHA2DS2-VASc of 6.  High risk medication monitoring.  She has had minimal episodes of atrial fibrillation on her amiodarone.  We Panayiota Larkin continue with current management.  2.  Chronic systolic heart failure: Ejection fraction on most recent echo and improved to 50 to 55%.  Currently on Toprol-XL, eplerenone, Lasix, Entresto.  Status post Medtronic ICD implanted 09/15/2020 for ventricular tachycardia.  Device functioning appropriately.  No changes.  3.  Coronary artery disease: Calcium score in the 82nd percentile.  Continue Crestor.  4.  COPD: Complete cessation encouraged  5.  Obstructive sleep apnea: CPAP compliance encouraged  6.  VT/VF: Status post Medtronic ICD.  Currently on amiodarone.  Unfortunately had ICD therapy 2 days ago.  She has been advised not to drive per [facility] law for 6 months.  And was started she had a be met a week ago, but Ashrith Sagan recheck to ensure that her potassium or magnesium is not low.  We Kohan Azizi also get a BNP as her OptiVol was previously elevated but is now normalized.  I Donald Memoli get in touch with her heart failure cardiologist see if there are any other studies to get prior to her appointment with him next week.  We Algis Lehenbauer start her on mexiletine 250 mg twice daily.  Current medicines are reviewed at length with the patient today.   The patient does not have concerns regarding her medicines.  The following changes were made today: Start mexiletine  Labs/ tests ordered today include:  Orders Placed This Encounter  Procedures   Basic metabolic panel   Magnesium   Pro b natriuretic peptide (BNP)   EKG 12-Lead     Disposition:   FU with Deitrich Steve 3 months  Signed, Miquel Stacks [name], MD  09/08/2021 10:46 AM  [place] [address] [place] [address] [phone] (office) [phone] (fax)

## 2021-09-08 NOTE — Telephone Encounter (Signed)
Pt has chf f/u 3/3 @ 1130

## 2021-09-09 LAB — PRO B NATRIURETIC PEPTIDE: NT-Pro BNP: 144 pg/mL (ref 0–738)

## 2021-09-09 LAB — BASIC METABOLIC PANEL
BUN/Creatinine Ratio: 27 (ref 12–28)
BUN: 48 mg/dL — ABNORMAL HIGH (ref 8–27)
CO2: 31 mmol/L — ABNORMAL HIGH (ref 20–29)
Calcium: 9.3 mg/dL (ref 8.7–10.3)
Chloride: 88 mmol/L — ABNORMAL LOW (ref 96–106)
Creatinine, Ser: 1.75 mg/dL — ABNORMAL HIGH (ref 0.57–1.00)
Glucose: 111 mg/dL — ABNORMAL HIGH (ref 70–99)
Potassium: 3.5 mmol/L (ref 3.5–5.2)
Sodium: 134 mmol/L (ref 134–144)
eGFR: 29 mL/min/{1.73_m2} — ABNORMAL LOW (ref >59)

## 2021-09-09 LAB — MAGNESIUM: Magnesium: 2.5 mg/dL — ABNORMAL HIGH (ref 1.6–2.3)

## 2021-09-11 ENCOUNTER — Encounter (HOSPITAL_COMMUNITY): Payer: Self-pay

## 2021-09-13 DIAGNOSIS — Z794 Long term (current) use of insulin: Secondary | ICD-10-CM | POA: Diagnosis not present

## 2021-09-13 DIAGNOSIS — E785 Hyperlipidemia, unspecified: Secondary | ICD-10-CM | POA: Diagnosis not present

## 2021-09-13 DIAGNOSIS — E1165 Type 2 diabetes mellitus with hyperglycemia: Secondary | ICD-10-CM | POA: Diagnosis not present

## 2021-09-13 DIAGNOSIS — I5020 Unspecified systolic (congestive) heart failure: Secondary | ICD-10-CM | POA: Diagnosis not present

## 2021-09-14 ENCOUNTER — Ambulatory Visit (INDEPENDENT_AMBULATORY_CARE_PROVIDER_SITE_OTHER): Payer: Medicare Other | Admitting: Pharmacist

## 2021-09-14 DIAGNOSIS — E785 Hyperlipidemia, unspecified: Secondary | ICD-10-CM

## 2021-09-14 DIAGNOSIS — I10 Essential (primary) hypertension: Secondary | ICD-10-CM

## 2021-09-14 DIAGNOSIS — I5020 Unspecified systolic (congestive) heart failure: Secondary | ICD-10-CM

## 2021-09-14 DIAGNOSIS — I480 Paroxysmal atrial fibrillation: Secondary | ICD-10-CM

## 2021-09-14 DIAGNOSIS — E1165 Type 2 diabetes mellitus with hyperglycemia: Secondary | ICD-10-CM

## 2021-09-15 ENCOUNTER — Ambulatory Visit (INDEPENDENT_AMBULATORY_CARE_PROVIDER_SITE_OTHER): Payer: Medicare Other

## 2021-09-15 DIAGNOSIS — I472 Ventricular tachycardia, unspecified: Secondary | ICD-10-CM

## 2021-09-16 ENCOUNTER — Other Ambulatory Visit: Payer: Self-pay

## 2021-09-16 ENCOUNTER — Encounter (HOSPITAL_COMMUNITY): Payer: Self-pay

## 2021-09-16 ENCOUNTER — Telehealth: Payer: Self-pay | Admitting: Pharmacist

## 2021-09-16 ENCOUNTER — Ambulatory Visit: Payer: Medicare Other

## 2021-09-16 ENCOUNTER — Ambulatory Visit (HOSPITAL_COMMUNITY)
Admission: RE | Admit: 2021-09-16 | Discharge: 2021-09-16 | Disposition: A | Discharge status: Home / Self Care | Payer: Medicare Other | Source: Ambulatory Visit | Attending: Family Medicine | Admitting: Family Medicine

## 2021-09-16 VITALS — BP 104/54 | HR 54 | Wt 272.8 lb

## 2021-09-16 DIAGNOSIS — R0602 Shortness of breath: Secondary | ICD-10-CM | POA: Diagnosis present

## 2021-09-16 DIAGNOSIS — E1151 Type 2 diabetes mellitus with diabetic peripheral angiopathy without gangrene: Secondary | ICD-10-CM | POA: Insufficient documentation

## 2021-09-16 DIAGNOSIS — I5022 Chronic systolic (congestive) heart failure: Secondary | ICD-10-CM | POA: Diagnosis not present

## 2021-09-16 DIAGNOSIS — Z8616 Personal history of COVID-19: Secondary | ICD-10-CM | POA: Diagnosis not present

## 2021-09-16 DIAGNOSIS — Z09 Encounter for follow-up examination after completed treatment for conditions other than malignant neoplasm: Secondary | ICD-10-CM | POA: Diagnosis not present

## 2021-09-16 DIAGNOSIS — I350 Nonrheumatic aortic (valve) stenosis: Secondary | ICD-10-CM | POA: Diagnosis not present

## 2021-09-16 DIAGNOSIS — I2510 Atherosclerotic heart disease of native coronary artery without angina pectoris: Secondary | ICD-10-CM | POA: Diagnosis not present

## 2021-09-16 DIAGNOSIS — Z9989 Dependence on other enabling machines and devices: Secondary | ICD-10-CM | POA: Insufficient documentation

## 2021-09-16 DIAGNOSIS — I484 Atypical atrial flutter: Secondary | ICD-10-CM | POA: Insufficient documentation

## 2021-09-16 DIAGNOSIS — I739 Peripheral vascular disease, unspecified: Secondary | ICD-10-CM | POA: Diagnosis not present

## 2021-09-16 DIAGNOSIS — I110 Hypertensive heart disease with heart failure: Secondary | ICD-10-CM | POA: Insufficient documentation

## 2021-09-16 DIAGNOSIS — M549 Dorsalgia, unspecified: Secondary | ICD-10-CM | POA: Insufficient documentation

## 2021-09-16 DIAGNOSIS — Z95810 Presence of automatic (implantable) cardiac defibrillator: Secondary | ICD-10-CM | POA: Diagnosis not present

## 2021-09-16 DIAGNOSIS — I472 Ventricular tachycardia, unspecified: Secondary | ICD-10-CM

## 2021-09-16 DIAGNOSIS — G8929 Other chronic pain: Secondary | ICD-10-CM | POA: Insufficient documentation

## 2021-09-16 DIAGNOSIS — J449 Chronic obstructive pulmonary disease, unspecified: Secondary | ICD-10-CM | POA: Diagnosis not present

## 2021-09-16 DIAGNOSIS — E785 Hyperlipidemia, unspecified: Secondary | ICD-10-CM | POA: Diagnosis not present

## 2021-09-16 DIAGNOSIS — I480 Paroxysmal atrial fibrillation: Secondary | ICD-10-CM | POA: Insufficient documentation

## 2021-09-16 DIAGNOSIS — G4733 Obstructive sleep apnea (adult) (pediatric): Secondary | ICD-10-CM | POA: Diagnosis not present

## 2021-09-16 LAB — CUP PACEART REMOTE DEVICE CHECK
Battery Remaining Longevity: 130 mo
Battery Voltage: 3.01 V
Brady Statistic RV Percent Paced: 0.67 %
Date Time Interrogation Session: 20230303003528
HighPow Impedance: 91 Ohm
Implantable Lead Implant Date: 20220302
Implantable Lead Location: 753860
Implantable Pulse Generator Implant Date: 20220302
Lead Channel Impedance Value: 589 Ohm
Lead Channel Impedance Value: 703 Ohm
Lead Channel Pacing Threshold Amplitude: 2.5 V
Lead Channel Pacing Threshold Pulse Width: 0.4 ms
Lead Channel Sensing Intrinsic Amplitude: 9.5 mV
Lead Channel Sensing Intrinsic Amplitude: 9.5 mV
Lead Channel Setting Pacing Amplitude: 5 V
Lead Channel Setting Pacing Pulse Width: 1 ms
Lead Channel Setting Sensing Sensitivity: 0.3 mV

## 2021-09-16 LAB — BASIC METABOLIC PANEL
Anion gap: 10 (ref 5–15)
BUN: 18 mg/dL (ref 8–23)
CO2: 29 mmol/L (ref 22–32)
Calcium: 8.7 mg/dL — ABNORMAL LOW (ref 8.9–10.3)
Chloride: 100 mmol/L (ref 98–111)
Creatinine, Ser: 1.39 mg/dL — ABNORMAL HIGH (ref 0.44–1.00)
GFR, Estimated: 39 mL/min — ABNORMAL LOW (ref >60)
Glucose, Bld: 98 mg/dL (ref 70–99)
Potassium: 4.2 mmol/L (ref 3.5–5.1)
Sodium: 139 mmol/L (ref 135–145)

## 2021-09-16 LAB — TSH: TSH: 8.131 u[IU]/mL — ABNORMAL HIGH (ref 0.350–4.500)

## 2021-09-16 LAB — BRAIN NATRIURETIC PEPTIDE: B Natriuretic Peptide: 73.9 pg/mL (ref 0.0–100.0)

## 2021-09-16 MED ORDER — METOLAZONE 2.5 MG PO TABS: ORAL_TABLET | ORAL | 5 refills | Status: DC

## 2021-09-16 NOTE — Chronic Care Management (AMB) (Signed)
Chronic Care Management Pharmacy Note  09/16/2021 Name:  Heidi Stark MRN:  [id number] DOB:  19-Sep-1940  Summary: Patient reports today that she has receched Medicare coverage gap already for 2023.  She has been approved for medication assistance program for [name] and Humalog 75/25 Mix  - thru 07/16/2022. Coordinated with BI to get 1st shipment of Entresto and [employer] for first Humalog Mix shipment. Patient has already received Farxiag.  Tried to [facility] regarding Ozempic patient assistance program as their automated system states that application is missing needed information, however because application was initiated by another office I was not able to get information. Sent message to clinical pharmacist in cardiology office as it appears they were initiating office LDL not at goal - due to have lipids rechecked with next labs. IF LDL still >70 consider adding ezetimibe 41m daily. Continue to monitor Thyroid function with amiodarone.    Subjective: Heidi THIENis an 81y.o. year old female who is a primary patient of Heidi Held DO.  The CCM team was consulted for assistance with disease management and care coordination needs.    Engaged with patient by telephone for follow up visit in response to provider referral for pharmacy case management and/or care coordination services.   Consent to Services:  The patient was given information about Chronic Care Management services, agreed to services, and gave verbal consent prior to initiation of services.  Please see initial visit note for detailed documentation.   Patient Care Team: Heidi Stark Heidi Apa DO as PCP - General CConstance Haw MD as PCP - Electrophysiology (Cardiology) Heidi Mutter MD as Consulting Physician (Ophthalmology) Heidi Pi MD as Consulting Physician (Endocrinology) Heidi Rued RN as Case Manager Heidi Stark RPH-CPP (Pharmacist) Heidi Dresser  MD as Consulting Physician (Cardiology)    Objective:  Lab Results  Component Value Date   CREATININE 1.75 (H) 09/08/2021   CREATININE 1.38 (H) 09/01/2021   CREATININE 1.31 (H) 06/24/2021    Lab Results  Component Value Date   HGBA1C 9.0 (H) 04/15/2021   Last diabetic Eye exam:  Lab Results  Component Value Date/Time   HMDIABEYEEXA No Retinopathy 08/17/2020 12:00 AM    Last diabetic Foot exam: No results found for: HMDIABFOOTEX      Component Value Date/Time   CHOL 186 01/27/2021 1146   CHOL 170 06/02/2019 0913   TRIG 292 (H) 01/27/2021 1146   HDL 34 (L) 01/27/2021 1146   HDL 31 (L) 06/02/2019 0913   CHOLHDL 5.5 01/27/2021 1146   VLDL 58 (H) 01/27/2021 1146   LDLCALC 94 01/27/2021 1146   LDLCALC 92 06/02/2019 0913   LDLCALC 77 06/28/2018 1410   LDLDIRECT 42.0 11/30/2014 1226    Hepatic Function Latest Ref Rng & Units 06/24/2021 05/30/2021 04/23/2021  Total Protein 6.0 - 8.5 g/dL 7.4 7.6 6.1(L)  Albumin 3.7 - 4.7 g/dL 4.3 3.6 2.5(L)  AST 0 - 40 IU/L 39 39 32  ALT 0 - 32 IU/L 20 26 23   Alk Phosphatase 44 - 121 IU/L 81 62 45  Total Bilirubin 0.0 - 1.2 mg/dL 0.3 0.6 0.6  Bilirubin, Direct 0.0 - 0.2 mg/dL - - -    Lab Results  Component Value Date/Time   TSH 8.995 (H) 05/30/2021 11:23 AM   TSH 1.188 04/18/2021 12:59 AM   TSH 5.510 (H) 12/20/2020 02:17 PM   TSH 1.740 09/09/2018 01:26 PM   FREET4 1.12 05/30/2021 11:23 AM   FREET4  1.51 12/20/2020 02:17 PM    CBC Latest Ref Rng & Units 04/23/2021 04/22/2021 04/21/2021  WBC 4.0 - 10.5 K/uL 9.0 8.7 10.1  Hemoglobin 12.0 - 15.0 g/dL 14.1 14.6 14.4  Hematocrit 36.0 - 46.0 % 42.9 45.1 45.1  Platelets 150 - 400 K/uL 240 179 213    Lab Results  Component Value Date/Time   VD25OH 4.3 (L) 09/09/2018 01:26 PM   VD25OH 22 (L) 11/18/2009 08:27 PM    Clinical ASCVD: Yes  The 10-year ASCVD risk score (Arnett DK, et al., 2019) is: 44.5%   Values used to calculate the score:     Age: 81 years     Sex: Female     Is  Non-Hispanic African American: No     Diabetic: Yes     Tobacco smoker: Yes     Systolic Blood Pressure: 696 mmHg     Is BP treated: Yes     HDL Cholesterol: 34 mg/dL     Total Cholesterol: 186 mg/dL    Other: CHADS2VASc = 6  Social History   Tobacco Use  Smoking Status Every Day   Packs/day: 1.00   Years: 50.00   Pack years: 50.00   Types: Cigarettes   Last attempt to quit: 08/17/2020   Years since quitting: 1.0  Smokeless Tobacco Never  Tobacco Comments   Pt started smoking again   BP Readings from Last 3 Encounters:  09/16/21 (!) 104/54  09/08/21 106/70  09/01/21 104/60   Pulse Readings from Last 3 Encounters:  09/16/21 (!) 54  09/08/21 (!) 59  09/01/21 (!) 56   Wt Readings from Last 3 Encounters:  09/16/21 272 lb 12.8 oz (123.7 kg)  09/08/21 273 lb 3.2 oz (123.9 kg)  09/01/21 275 lb 3.2 oz (124.8 kg)    Assessment: Review of patient past medical history, allergies, medications, health status, including review of consultants reports, laboratory and other test data, was performed as part of comprehensive evaluation and provision of chronic care management services.   SDOH:  (Social Determinants of Health) assessments and interventions performed:     CCM Care Plan  Allergies  Allergen Reactions   Neomycin-Bacitracin Zn-Polymyx Rash   Sulfonamide Derivatives Other (See Comments)    Stomach cramps   Ciprofloxacin Hives, Itching and Rash    Pt doesn't remember having any reaction to cipro   Spironolactone Rash    Medications Reviewed Today     Reviewed by Heidi Stark, PharmD, CLINICAL PHARMACIST PRACTITIONER 09/14/2021 Med List Status: <None>   Medication Order Taking? Sig Documenting Provider Last Dose Status Informant  acetaminophen (TYLENOL) 500 MG tablet [id number] Yes Take 500 mg by mouth every 6 (six) hours as needed for moderate pain or headache. [provider] Taking Active Self  albuterol (VENTOLIN HFA) 108 (90 Base) MCG/ACT inhaler  [id number] Yes Inhale 2 puffs into the lungs every 4 (four) hours as needed for wheezing or shortness of breath. Geradine Girt, DO Taking Active   amiodarone (PACERONE) 200 MG tablet [id number] Yes Take 1 tablet (200 mg total) by mouth daily. Shirley Friar, PA-C Taking Active   apixaban (ELIQUIS) 5 MG TABS tablet [id number] Yes Take 1 tablet (5 mg total) by mouth 2 (two) times daily. Lyda Jester M, PA-C Taking Active Self  eplerenone (INSPRA) 25 MG tablet [id number] Yes TAKE 1 TABLET (25 MG TOTAL) BY MOUTH DAILY. Larey Dresser, MD Taking Active Self  FARXIGA 10 MG TABS tablet [id number] Yes TAKE 1 TABLET BY MOUTH EVERY DAY BEFORE  Penne Lash, MD Taking Active            Med Note Jonathon Jordan Aug 25, 2021 11:27 AM) [place] patient [place] program thru 07/16/2022  fenofibrate (TRICOR) 145 MG tablet [id number] Yes TAKE 1 TABLET BY MOUTH EVERY DAY Larey Dresser, MD Taking Active   furosemide (LASIX) 40 MG tablet [id number] Yes Take 2 tablets (80 mg total) by mouth 2 (two) times daily. Larey Dresser, MD Taking Active Self  HUMALOG MIX 75/25 KWIKPEN (75-25) 100 UNIT/ML KwikPen [id number] Yes Inject 30-40 Units into the skin See admin instructions. Take 40 units in the AM, then take 30 units in the afternoon per patient [provider] Taking Active Self  icosapent Ethyl (VASCEPA) 1 g capsule [id number] Yes TAKE 2 CAPSULES BY MOUTH TWICE A DAY Larey Dresser, MD Taking Active   KLOR-CON M10 10 MEQ tablet [id number] Yes TAKE 2 TABLETS BY MOUTH 2 TIMES DAILY. Larey Dresser, MD Taking Active   LIVALO 4 MG TABS [id number] Yes TAKE 1 TABLET BY MOUTH EVERY DAY Larey Dresser, MD Taking Active   metoprolol succinate (TOPROL XL) 25 MG 24 hr tablet [id number] Yes Take 1 tablet (25 mg total) by mouth daily. Larey Dresser, MD Taking Active   mexiletine (MEXITIL) 250 MG capsule [id number] Yes Take 1 capsule (250 mg total) by mouth 2 (two)  times daily. Heidi Haw, MD Taking Active   Multiple Vitamin (MULTIVITAMIN WITH MINERALS) TABS tablet [id number] Yes Take 1 tablet by mouth daily. [provider] Taking Active Self  rOPINIRole (REQUIP) 0.25 MG tablet [id number] Yes Take 2 tablets (0.5 mg total) by mouth at bedtime. Carollee Stark, Kendrick Fries R, DO Taking Active   sacubitril-valsartan (ENTRESTO) 49-51 MG [id number] Yes Take 1 tablet by mouth 2 (two) times daily. Larey Dresser, MD Taking Active Self           Med Note Antony Contras, Gregg Sep 14, 2021  3:11 PM) Approved to receive from medication assistance program thru 07/16/2022  Semaglutide, 1 MG/DOSE, (OZEMPIC, 1 MG/DOSE,) 4 MG/3ML SOPN [id number] Yes Inject 1 mg into the skin once a week. Heidi Haw, MD Taking Active            Med Note Carlena Bjornstad, Tiney Rouge   Thu Sep 01, 2021 11:00 AM)              Patient Active Problem List   Diagnosis Date Noted   COVID-19 04/15/2021   Respiratory failure with hypoxia ([place]) 47/18/5501   Systolic CHF ([place]) 58/68/2574   CKD (chronic kidney disease) stage 3, GFR 30-59 ml/min (HCC) 04/15/2021   Elevated troponin 04/15/2021   Acute on chronic systolic (congestive) heart failure ([place]) 09/14/2020   Wide-complex tachycardia 09/07/2020   Ventricular tachyarrhythmia 09/07/2020   VT (ventricular tachycardia) 09/07/2020   Persistent atrial fibrillation ([place]) 04/30/2018   Wound of left leg 10/01/2017   Cellulitis of right lower extremity 11/27/2016   Encounter for assessment for deep vein thrombosis (DVT) 11/14/2016   Hypokalemia 08/13/2016   Constipation 08/13/2016   Type 2 diabetes mellitus with hyperglycemia, with long-term current use of insulin (HCC)    Atherosclerosis of aorta ([place]) 06/26/2016   Tobacco abuse 06/26/2016   Colitis 11/24/2014   Severe obesity (BMI >= 40) ([place]) 11/12/2013   PAF (paroxysmal atrial fibrillation) ([place]) 11/03/2013   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  10/28/2013   COLONIC POLYPS 08/16/2010   PAD (peripheral artery disease) ([place]) 08/15/2010   HLD (hyperlipidemia) 11/18/2009   MYALGIA 11/18/2009   OBESITY 05/14/2007   PULMONARY NODULE, RIGHT MIDDLE LOBE 05/14/2007   Obstructive sleep apnea 12/06/2006   Essential hypertension 12/06/2006   COPD (chronic obstructive pulmonary disease) ([place]) 12/06/2006    Immunization History  Administered Date(s) Administered   Influenza Whole 04/28/2009, 04/27/2010   Influenza, High Dose Seasonal PF 04/24/2014   Influenza-Unspecified 04/17/2015   Pneumococcal Conjugate-13 10/11/2015   Pneumococcal Polysaccharide-23 07/17/2002, 11/29/2011   Td 07/17/2002, 06/28/2018   Zoster, Live 11/11/2008    Conditions to be addressed/monitored: Atrial Fibrillation, CHF, CAD, HTN, HLD, Hypertriglyceridemia, COPD, and DMII  Care Plan : General Pharmacy (Adult)  Updates made by Heidi Stark, RPH-CPP since 09/16/2021 12:00 AM     Problem: HTN; CHF; atrial fibrillation, Hyperlipidemia; type 2 DM, insulin dependent;   Priority: High  Onset Date: 05/11/2021     Long-Range Goal: Provide education, support and care coordination for medication therapy and chronic conditions   Start Date: 05/11/2021  Expected End Date: 05/11/2022  Recent Progress: On track  Priority: High  Note:   Current Barriers:  Unable to independently afford treatment regimen Unable to achieve control of type 2 diabetes  Unable to maintain control of hypertension and hyperlipidemia   Pharmacist Clinical Goal(s):  Over the next 180 days, patient will verbalize ability to afford treatment regimen achieve control of type 2 diabetes as evidenced by A1c <8.0% (to start, will adjust goal as needed) maintain control of hypertension and hyperlipidemia  as evidenced by blood pressure <140/90 and LDL <70  through collaboration with PharmD and provider.   Interventions: 1:1 collaboration with Carollee Stark, [name], DO regarding development  and update of comprehensive plan of care as evidenced by provider attestation and co-signature Inter-disciplinary care team collaboration (see longitudinal plan of care) Comprehensive medication review performed; medication list updated in electronic medical record  Diabetes: Uncontrolled; Initial A1c goal <8.0% - will try to get <7.5% if able without increase in hypoglycemia Managed by endocrinologist - Dr [name] (last visit 07/01/2021) Current treatment: Humalog 75/25 Mix - inject 40 units each morning and 30 units each evening Farxiga 55m daily before breakfast Ozempic 131minjected subcutaneously once weekly Past treatments: Invokamet; Lantus Ozempic was initiated by Dr McAundra Dubinor DM, CHD and weight loss.   Wt Readings from Last 3 Encounters:  09/16/21 272 lb 12.8 oz (123.7 kg)  09/08/21 273 lb 3.2 oz (123.9 kg)  09/01/21 275 lb 3.2 oz (124.8 kg)  Current glucose readings:  AM: 110 to 140's PM: 150 to 160's Denies hypoglycemic/hyperglycemic symptoms (no blood glucose readings <100) Patient has been approved for [place] assistance program thru 07/16/2022  Awaiting decision about Humalog and Ozempic patient assistance program - patient has reached Medicare coverage gap.    Interventions:  Ch[place]ient assistance program for Humalog Mix - patient's application was approved 08/31/2021. Order has not been mailed yet. Requested order to be expedited. Per [employer] representative patient should receive first shipment around 03/09/202.  [facility] as their automated system states that application is missing needed information, however because application was initiated by another office I was not able to get information. Sent message to clinical pharmacist in cardiology office as it appears they were initiating office.  Reviewed home blood glucose readings and reviewed goals  Fasting blood glucose goal (before  meals) = 80 to  130 Blood glucose goal after a meal = less than 180   Hypertension / CHF: Controlled CHF  managed by Dr [name] with [facility] Current treatment: Farxiga 14m daily  Entresto 49-567m- take 1 tablet twice a day Inspra / Eplerenone 2565mnce a day Furosemide 21m67mtake 2 tablets = 80mg31mce a day Potassium 10 mEq - take 2 tablets twice a day  Metoprolol ER 25mg 42my (Restarted 05/30/2021 - in past had hypotension with beta blockers) Recently took metolazone 2.5mg fo28m doses due to fluid retention / weight gain. Current home readings: Patient could not recall exact numbers and did not have record but states blood pressure never >140/90;  BNP (last 3 results) Recent Labs    04/21/21 0141 04/22/21 0146 04/23/21 0117  BNP 60.7 47.4 61.7    ProBNP (last 3 results) Recent Labs    09/08/21 1053  PROBNP 144  EF = 55% (12/2017) Approved for FarxigaWilder Gladetresto patient assistance program thru 07/16/2022.   Interventions:  Reviewed blood pressure and medications Called [employer] - Entresto application has been approved but they were unable to reach patient to verify shipment. Assisted with shipping approval. Patient should receive first Entresto shipment by 09/20/2021 Continue to follow up with [facility] - next appointment 09/16/2021  Hyperlipidemia: Uncontrolled; LDL goal <70 Current treatment: Livalo 4mg dai59m Fenofibrate 145mg dai79mascepa / icosapent ethyl 1 gram - take 2 capsules twice a day Medications previously tried: pravastatin and rosuvastatin  Interventions:  Discussed LDL goals Screened for patient assistance for Livalo but patient's income did not meet program limit. Continue current medications for lipid lowering. Recommend checking lipids with next labs.  Dicussed limiting saturated and trans fat in diet. (Addressed at previous visit)   Atrial Fibrillation: Controlled;  current rate/rhythm control Amiodarone  100mg dail11mtakeing 0.5 tablet of 200mg daily37mse lowered 05/2021 per Dr McLean) antAundra Dubinlant treatment: Eliquis 5mg twice a75my CHADS2VASc score: 6 TSH was slightly elevated when checked 05/30/2021 but T4 and T3 were WNL. LFTS were WNL Patient reports she has had eye exam.  Intervention:  Reminded patient to monitor for signs and symptoms of bleeding Continue to monitor LFTs and thyroid function and get annual eye exam while taking amiodarone Will continue to follow in 2023 and provide medication assistance program application for Eliquis (cannot apply until reaches coverage gap)  Restless Leg Syndrome:  Improved; goal: decrease symptoms of leg pain / movement  Current therapy:  Ropinirole 0.25mg - take 76mblet at bedtime for 3 to 4 nights, then increase to 2 tablets at night Started ropinirole 06/2021. Patient reports leg pain has improved significantly She endorses that she is only taking ropinirole as needed Intervention:  Reviewed directions for ropinirole with patient. Ok to take as needed if this is working for her but also discussed that she can take every night at bedtime.   Health Maintenance:  Reviewed vaccination history and discussed benefits of COVID, influenza, Shingrix vaccines Patient declines to get COVID, influenza or Shingrix vaccines at this time  Medication management Pharmacist Clinical Goal(s): Over the next 90 days, patient will work with PharmD and providers to maintain optimal medication adherence Current pharmacy: [facility] Comprehensive medication review performed. Reviewed refill history and assessed adherence Continue current medication management strategy  Patient Goals/Self-Care Activities Over the next 180 days, patient will:  take medications as prescribed,  check blood pressure 3 to 4 times per week, document, and provide at  future appointments,  weigh daily, and contact provider if weight gain of more than 3 lbs in 24 hours or 5  lbs in 1 week, and collaborate with provider on medication access solutions  Follow Up Plan: Telephone follow up appointment with care management team member scheduled for:  1 month         Medication Assistance:She has been approved to receive Farxiga 33m [place] thru 07/16/2022. Approved 08/30/2021 to received Humalog Mix insulin thru 07/16/2022 and also [facility]  Patient did not qualify for patient assistance program for Livalo due to income restrictions.  Checked Ozempic patient assistance program. Per automatic system application has not been processed due to needing additional information. I was not able to determine additional information needed since cardiology office initiated application.  Sent message to clinical pharmacist at cardiology office that was assisting with application to let them know that addition information was needed to process application.  Patient's [facility] pharmacy [facility]:  [id number] [address]: [phone]: [id number] [address] [place]: [phone]: [phone] Up:  Patient agrees to Care Plan and Follow-up.  Plan: Telephone follow up appointment with care management team member scheduled for:  2 to 3 weeks to check patient assistance program applications  TamCherre RobinsharmD Clinical Pharmacist [facility]

## 2021-09-16 NOTE — Patient Instructions (Signed)
Labs done today. We will contact you only if your labs are abnormal. ? ?RESTART Metolazone 2.5mg  (1 tablet) by mouth every other Friday. ? ?TODAY TAKE METOLAZONE 2.5MG  (1 TABLET) BY MOUTH WITH AN EXTRA 40MEQ(4 TABLETS) OF POTASSIUM WITH YOUR EVENING DOSE OF LASIX. ? ?No other medication changes were made. Please continue all current medications as prescribed. ? ?Your physician recommends that you schedule a follow-up appointment in: 2 weeks for a lab only appointment and in 3 months with Dr. [name]. ? ?If you have any questions or concerns before your next appointment please send [name] a message through [place] or call our office at [phone].   ? ?TO LEAVE A MESSAGE FOR THE NURSE SELECT OPTION 2, PLEASE LEAVE A MESSAGE INCLUDING: ?YOUR NAME ?DATE OF BIRTH ?CALL BACK NUMBER ?REASON FOR CALL**this is important as we prioritize the call backs ? ?YOU WILL RECEIVE A CALL BACK THE SAME DAY AS LONG AS YOU CALL BEFORE 4:00 PM ? ? ?Do the following things EVERYDAY: ?Weigh yourself in the morning before breakfast. Write it down and keep it in a log. ?Take your medicines as prescribed ?Eat low salt foods--Limit salt (sodium) to 2000 mg per day.  ?Stay as active as you can everyday ?Limit all fluids for the day to less than 2 liters ? ? ?At the [facility], you and your health needs are our priority. As part of our continuing mission to provide you with exceptional heart care, we have created designated Provider Care Teams. These Care Teams include your primary Cardiologist (physician) and Advanced Practice Providers (APPs- Physician Assistants and Nurse Practitioners) who all work together to provide you with the care you need, when you need it.  ? ?You may see any of the following providers on your designated Care Team at your next follow up: ?Dr Glori Bickers ?Dr [name] ?Darrick Grinder, NP ?Lyda Jester, PA ?Audry Riles, PharmD ? ? ?Please be sure to bring in all your medications bottles to every  appointment.  ? ?

## 2021-09-16 NOTE — Telephone Encounter (Signed)
Finally was able to speak with [employer]. Patient did not sign 2 spots of the application.  ?I called pt and reviewed with her. I have emailed her the 2 papers that need to be signed. She will sign and email back if she can, if not she will put it in the mail back to [name]. ?

## 2021-09-16 NOTE — Patient Instructions (Signed)
Heidi Stark ?It was a pleasure speaking with you today.  ?I have attached a summary of our visit today and information about your health goals.  ? ?Our next appointment is by telephone on November 01, 2021 at 2:45pm ? ?Please call the care guide team at [phone] if you need to cancel or reschedule your appointment.  ? ?If you have any questions or concerns, please feel free to contact me either at the phone number below or with a MyChart message.  ? ?Keep up the good work! ? ?[name], PharmD ?Clinical Pharmacist ?[facility] ?[place] ?[phone] (direct line)  ?[phone] (main office number) ? ?Current Barriers:  ?Unable to independently afford treatment regimen ?Unable to achieve control of type 2 diabetes  ?Unable to maintain control of hypertension and hyperlipidemia  ? ?Diabetes: ?Uncontrolled; Initial A1c goal <8.0% - will try to get <7.5% if able without increase in hypoglycemia ? ?Lab Results  ?Component Value Date  ? HGBA1C 9.0 (H) 04/15/2021  ? ? ?Managed by endocrinologist - Dr [name] ?Current treatment: ?Humalog 75/25 Mix - inject 40 units each morning and 30 units each evening ?Farxiga 10mg  daily before breakfast ?Ozempic inject 0.5mg  weekly (increase as planned to 1mg  weekly) ?Interventions:  ?Reviewed home blood glucose readings and reviewed goals  ?Fasting blood glucose goal (before meals) = 80 to 130 ?Blood glucose goal after a meal = less than 180  ?Follow up with Dr [name] for adjustment in insulin therpay ?Approved for patient assistance program for [name] (received first shipment 07/2021); Humalog 75/25 Mix approved 08/30/2021 thru 07/16/2022 - requested shipment. You should received around 09/22/2021.  ?Ozempic application still pending ? ?Hypertension / CHF: ?BP Readings from Last 3 Encounters:  ?09/16/21 (!) 104/54  ?09/08/21 106/70  ?09/01/21 104/60  ? ? ?Current treatment: ?Farxiga 10mg  daily  ?Entresto 49-51mg  - take 1 tablet twice a day ?Inspra / Eplerenone  25mg  once a day ?Furosemide 40mg  - take 2 tablets = 80mg  twice a day ?Potassium 10 mEq - take 2 tablets twice a day  ?Metoprolol ER 25mg  daily  ?Interventions:  ?Reviewed blood pressure and medications ?Continue to follow up with [facility] Failure [facility] ?Approved for patient assistance program for [name] (received first shipment 07/2021) ?Approved for [facility] patient assistance program - first shipment requested. Should received around 09/22/2021 ? ?Hyperlipidemia: ?Uncontrolled; LDL goal <70 ? ?Lab Results  ?Component Value Date  ? CHOL 186 01/27/2021  ? HDL 34 (L) 01/27/2021  ? [place] 94 01/27/2021  ? LDLDIRECT 42.0 11/30/2014  ? TRIG 292 (H) 01/27/2021  ? CHOLHDL 5.5 01/27/2021  ? ? ?Current treatment: ?Livalo 4mg  daily  ?Fenofibrate 145mg  daily ?Vascepa / icosapent ethyl 1 gram - take 2 capsules twice a day ?Interventions:  ?Discussed LDL goals ?Continue current medications for lipid lowering. Recommend checking lipids with next labs.  ?Discussed limiting saturated and trans fat in diet.  ? ?Atrial Fibrillation: ?current rate/rhythm control Amiodarone 100mg  (taking 0.5 tablet of 200mg ) daily (dose lowered 05/30/2021) ?anticoagulant treatment: Eliquis 5mg  twice a day ?Intervention:  ?Reminded patient to monitor for signs and symptoms of bleeding ?Monitor liver function and thyroid function and get annual eye exam while taking amiodarone ?Will continue to follow in 2023 and provide medication assistance program application for Eliquis (cannot apply until reaches coverage gap) ? ?Medication management ?Pharmacist Clinical Goal(s): ?Over the next 90 days, patient will work with PharmD and providers to maintain optimal medication adherence ?Current pharmacy: [facility] ?Interventions ?Comprehensive medication review performed. ?Reviewed refill history and  assessed adherence ?Continue current medication management strategy ?Patient was not aware the antibiotic levofloxacin has been called into her pharmacy 2 days ago. Made her  aware and she will pick up today. ? ? ?Patient Goals/Self-Care Activities ?Over the next 180 days, patient will:  ?take medications as prescribed,  ?check blood pressure 3 to 4 times per week, document, and provide at future appointments,  ?weigh daily, and contact provider if weight gain of more than 3 lbs in 24 hours or 5 lbs in 1 week, and collaborate with provider on medication access solutions ? ? ?Patient verbalizes understanding of instructions and care plan provided today and agrees to view in [place]. Active MyChart status confirmed with patient.    ?

## 2021-09-16 NOTE — Progress Notes (Signed)
Advanced Heart Failure Clinic Note   PCP:  Ann Held, DO  HF Cardiologist: Dr. [name]  History of Present Illness: Heidi Stark is a 81 y.o. female who has history of HTN, DM, hyperlipidemia.  She was admitted in 4/15 with splenic and renal infarcts.  She was found to have paroxysmal atrial fibrillation.  In 4/15, she had fever and flank pain.  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  TEE showed prominent aortic plaque.  While she was in the hospital, she was noted to have a brief run of atrial fibrillation and was started on coumadin.     She has left leg pain with ambulation after about 100 feet, LE dopplers 5/15 showed occluded left external iliac artery. This has been present ever since she had left iliac damage with a prior back surgery.     She was stable until 1/18, when she developed palpitations and exertional dyspnea.  She was admitted to [facility] with atrial fibrillation/RVR and acute systolic CHF.  EF was found to be 30-35%, down from 50-55% in 2015.  She was diuresed and underwent DCCV back to NSR.  She subsequently had atrial fibrillation ablation in 4/18.  She went back into atrial fibrillation in 5/19 and had DCCV back today NSR.    Echo was done 6/19 and reviewed, EF 55%, mild LVH, moderate diastolic dysfunction, normal RV size and systolic function.    With recurrent atrial fibrillation, she was admitted in 10/19 to start Tikosyn.  She was cardioverted back to NSR.   She had a recent admission in 2/22 with lightheadedness/presyncope that appeared to correlate with runs of VT.  She was on Tikosyn for atrial fibrillation though VT not thought to be due to Tikosyn (not polymorphic, QT not prolonged).  Tikosyn was stopped and amiodarone was started.  Echo in 2/22 showed EF 50-55% but cMRI (2/22) showed EF 40% with subendocardial scar in the basal to mid inferolateral wall, RV EF was 38%.  She had coronary angiography showing  no obstructive disease.  VT subsided and she was sent home on amiodarone.    09/13/20, she had 3 episodes of presyncope in succession while sitting at a desk.  No prodrome, each episode of severe lightheadedness lasted maybe 30 seconds.  She did not pass out. Because of these events, she came to the ER. She was admitted for observation and amiodarone gtt was restarted.  She had no VT overnight.  She remained in atypical atrial flutter today (was in atypical flutter at last admission). She had a single chamber Medtronic ICD placed 09/15/20. Discharge weight 256 lbs.   She had post hospital f/u on 09/22/20 and was noted to be back in atrial flutter w/ CVR. She was set up for repeat DCCV 3 weeks later. She underwent DCCV on 10/11/20 and converted back to NSR.   She had COVID-19 in 9/22.   Follow up 09/01/21, she was volume overloaded and instructed to take metolazone 2.5 + extra 40 KCL x 2 days.  Multiple episodes of VT on 09/06/21, received ATP which slowed her rate but no shocks. Follow up with EP 09/08/21 and started on mexiletine 250 bid.  Today she returns for HF follow up. Overall feeling fine. She is SOB with walking further distances, limited significantly by chronic back pain. Denies palpitations, abnormal bleeding, CP, dizziness, edema, or PND/Orthopnea. Appetite ok. No fever or chills. Weight at home 269 pounds. Taking all  medications and wearing CPAP at night.   MDT device interrogation: OptiVol below threshold but trending up, thoracic impedence down, no recent AF,  + VT 09/06/21, EP aware, <1 hr/day activity (personally reviewed).  Labs (4/15): K 4.1, creatinine 0.7, HCT 46.2 Labs (7/15): K 3.9, creatinine 0.8, HCT 50 Labs (12/15): K 5, creatinine 0.7, HDL 33, LDL 57 Labs (5/16): K 4, creatinine 0.84, TGs 256, HDL 29, LDL 42, HCT 41.2 Labs (1/18): K 3.4, creatinine 0.78 Labs (2/18): K 4.4, creatinine 0.8, free T3/T4 normal, mildly elevated TSH, LFTs normal, hgb 15.6 Labs (4/18): K 4.2,  creatinine 0.86, TSH elevated, free T3 and free T4 normal Labs (5/19): K 3.9, creatinine 0.78 Labs (6/19): K 4.4, creatinine 0.88 Labs (12/19): K 4.2, creatinine 0.85, TGs 322, LDL 77 Labs (2/20): LDL 46, TGs 362 Labs (3/20): K 4.7, creatinine 1.07 Labs (11/20): TGs 282, LDL 92 Labs (3/22): K 4.1, creatinine 1.23 Labs (4/22): K 4.2, creatinine 1.2, LFTs normal, TSH mildly elevated, free T4 mildly elevated, normal T3.  Labs (6/22): TSH mildly elevated, free T3 and T4 normal Labs (7/22): TGs 292, LDL 94 Labs (10/22): K 3.8, creatinine 1.04, BNP 62, hgb 14.1, LFTs normal Labs (2/23): K 3.5, creatinine 1.75, mag 2.5  PMH: 1.Type II diabetes 2. HTN 3. Hyperlipidemia 4. COPD: has quit smoking.  5. CAD: Coronary CTA (4/18) with nonobstructive coronary disease, calcium score 335 Agatston units (82nd percentile).  - Coronary angiography (2/22) with no obstructive disease 6. Paroxysmal atrial fibrillation: First noted in 4/15.  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   - Event monitor (4/15) with no atrial fibrillation.  - Admission with atrial fibrillation/RVR in 1/18.  - Atrial fibrillation ablation in 4/18.  - DCCV in 5/19.  - Tikosyn started + DCCV in 10/19. VT => Tikosyn stopped and amiodarone begun in 3/22.   - S/p single chamber MDT ICD 09/15/20 - DCCV to NSR in 3/22.  7. Chronic systolic CHF: ?tachycardia-mediated as first noted in setting of atrial fibrillation with RVR.  - TEE (4/15) with EF 50-55%, mild LVH, grade III-IV plaque in descending thoracic aorta, RV normal, no PFO, peak RV-RA gradient 42 mmHg.   - Echo (1/18) with EF 30-35%, diffuse hypokinesis, moderate LVH, mildly dilated RV, PASP 55 mmHg.  - Echo (4/18) with EF 50%, anterolateral/inferolateral mild hypokinesis, mild aortic stenosis, IVC normal.  - Echo (6/19) with EF 55%, mild LVH, moderate diastolic dysfunction, normal RV size and systolic function.  - cMRI (2/22) with EF 40%  with subendocardial scar in the basal to mid inferolateral wall, RV EF was 38%. - Echo (2/22) with EF 50-55%, normal RV.  7. H/o diskectomy. 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 Lower extremity arterial dopplers (5/15) with occluded left external iliac artery.  - ABIs (5/18): 1.16 (normal) on right, 0.56 on left.  9. Aortic stenosis: Mild on 4/18 echo.  10. OSA: Uses CPAP 11. VT: 2/22.  No significant CAD on cath in 2/22.  cMRI with EF 40% with subendocardial scar in the basal to mid inferolateral wall, RV EF was 38%. - Medtronic ICD placed 3/22.  12. COVID-19 9/22  Current Outpatient Medications  Medication Sig Dispense Refill   acetaminophen (TYLENOL) 500 MG tablet Take 500 mg by mouth every 6 (six) hours as needed for moderate pain or headache.     albuterol (VENTOLIN HFA) 108 (90 Base) MCG/ACT inhaler Inhale 2 puffs into the lungs every 4 (four) hours as  needed for wheezing or shortness of breath. 8 g 0   amiodarone (PACERONE) 200 MG tablet Take 1 tablet (200 mg total) by mouth daily. 90 tablet 3   apixaban (ELIQUIS) 5 MG TABS tablet Take 1 tablet (5 mg total) by mouth 2 (two) times daily. 60 tablet 11   eplerenone (INSPRA) 25 MG tablet TAKE 1 TABLET (25 MG TOTAL) BY MOUTH DAILY. 90 tablet 1   FARXIGA 10 MG TABS tablet TAKE 1 TABLET BY MOUTH EVERY DAY BEFORE BREAKFAST 30 tablet 6   fenofibrate (TRICOR) 145 MG tablet TAKE 1 TABLET BY MOUTH EVERY DAY 90 tablet 3   furosemide (LASIX) 40 MG tablet Take 2 tablets (80 mg total) by mouth 2 (two) times daily. 270 tablet 3   HUMALOG MIX 75/25 KWIKPEN (75-25) 100 UNIT/ML KwikPen Inject 30-40 Units into the skin See admin instructions. Take 40 units in the AM, then take 30 units in the afternoon per patient     icosapent Ethyl (VASCEPA) 1 g capsule TAKE 2 CAPSULES BY MOUTH TWICE A DAY 120 capsule 5   KLOR-CON M10 10 MEQ tablet TAKE 2 TABLETS BY MOUTH 2 TIMES DAILY. 360 tablet 3   LIVALO 4 MG TABS TAKE 1 TABLET BY MOUTH EVERY  DAY 90 tablet 3   metoprolol succinate (TOPROL XL) 25 MG 24 hr tablet Take 1 tablet (25 mg total) by mouth daily. 30 tablet 6   mexiletine (MEXITIL) 250 MG capsule Take 1 capsule (250 mg total) by mouth 2 (two) times daily. 60 capsule 3   Multiple Vitamin (MULTIVITAMIN WITH MINERALS) TABS tablet Take 1 tablet by mouth daily.     rOPINIRole (REQUIP) 0.25 MG tablet Take 2 tablets (0.5 mg total) by mouth at bedtime. 180 tablet 0   sacubitril-valsartan (ENTRESTO) 49-51 MG Take 1 tablet by mouth 2 (two) times daily. 60 tablet 3   Semaglutide, 1 MG/DOSE, (OZEMPIC, 1 MG/DOSE,) 4 MG/3ML SOPN Inject 1 mg into the skin once a week. 3 mL 1   No current facility-administered medications for this encounter.    Allergies:   Neomycin-bacitracin zn-polymyx, Sulfonamide derivatives, Ciprofloxacin, and Spironolactone   Social History:  The patient  reports that she has been smoking cigarettes. She has a 50.00 pack-year smoking history. She has never used smokeless tobacco. She reports that she does not drink alcohol and does not use drugs.   Family History:  The patient's family history includes Cancer in her mother; Diabetes in her father, mother, and another family member; Factor V Leiden deficiency in her daughter; Hypertension in her mother; Melanoma in an other family member; Obesity in her father and mother.   ROS:  Please see the history of present illness.   All other systems are personally reviewed and negative.   BP (!) 104/54    Pulse (!) 54    Wt 123.7 kg (272 lb 12.8 oz)    SpO2 96%    BMI 44.03 kg/m   Wt Readings from Last 3 Encounters:  09/16/21 123.7 kg (272 lb 12.8 oz)  09/08/21 123.9 kg (273 lb 3.2 oz)  09/01/21 124.8 kg (275 lb 3.2 oz)   PHYSICAL EXAM: General:  NAD. No resp difficulty, arrived in [facility] HEENT: Normal Neck: Supple. Thick neck. Carotids 2+ bilat; no bruits. No lymphadenopathy or thryomegaly appreciated. Cor: PMI nondisplaced. Regular rate & rhythm. No rubs, gallops or  murmurs. Lungs: Clear Abdomen: Obese, nontender, nondistended. No hepatosplenomegaly. No bruits or masses. Good bowel sounds. Extremities: No cyanosis, clubbing, rash, edema Neuro:  Alert & oriented x 3, cranial nerves grossly intact. Moves all 4 extremities w/o difficulty. Affect pleasant.  Recent Labs: 04/23/2021: B Natriuretic Peptide 61.7; Hemoglobin 14.1; Platelets 240 05/30/2021: TSH 8.995 06/24/2021: ALT 20 09/08/2021: BUN 48; Creatinine, Ser 1.75; Magnesium 2.5; NT-Pro BNP 144; Potassium 3.5; Sodium 134  Personally reviewed   ASSESSMENT AND PLAN: 1. H/o VT: noted 08/2020. 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 Had been on Tikosyn but this was not the typical Tikosyn-induced polymorphic VT and she had had no changes in Tikosyn dosing prior to occurrence. cMRI showed subendocardial LGE in the basal to mid inferolateral wall. This appeared to be a coronary disease pattern however coronary angiogram did not show significant coronary disease.  She was transitioned from Tikosyn to amiodarone and underwent Medtronic single chamber ICD on 09/15/20. Recent VT on 09/06/21 and was started on mexiletine. Recent Mag 2.5. - She is on amiodarone 200 mg daily.  Check TSH today. 2. Atrial fibrillation/flutter: Paroxysmal.  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 She was admitted in 1/18 with atrial fibrillation/RVR and CHF.  She was diuresed and had DCCV back to NSR. She had atrial fibrillation ablation in 4/18.  In 5/19, she went into atrial fibrillation transiently.  In 10/19, she was back in atrial fibrillation and was admitted for Tikosyn initiation and cardioversion.  Atypical atrial flutter noted in 12/21 with DCCV to NSR. As outlined above, now off Tikosyn and on amiodarone. Reverted back to rate controlled atypical atrial flutter at post hospital f/u 09/30/20. Underwent repeat DCCV in 3/22.  - Continue amiodarone 200 mg daily.  - Continue Eliquis 5 mg bid. No  bleeding issues.  3. Claudication: Left leg claudication with absent left PT pulse.  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   4. Chronic systolic => diastolic CHF: EF 93-71% on echo in 1/18, down from 50-55% in 4/15.  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 In 2/22, cMRI showed LV EF 40% with RV E 38%, inferolateral subendocardial LGE, but echo in 2/22 showed EF 50-55%.   Stable NYHA II-early III, functional status limited by body habitus and chronic back pain. Volume difficult to assess due to body habitus, but OptiVol trending back up.   - Continue Lasix 80 mg bid and give metolazone 2.5 mg + extra 40 meq KCL today and repeat every other Friday. BMET/BNP today; BMET in 2 weeks.  - Will ask Sharman Cheek to send OptiVol reading in 2 weeks to follow fluid trend. - Continue Toprol XL 25 mg daily.   - Continue Entresto 49/51 mg bid.  - Continue eplerenone 25 mg daily.  - Continue dapagliflozin 10 mg daily.  - Discussed low salt food choices and limiting fluid intake to < 2 liters per day.  5. CAD: Coronary CTA in 2018 showed coronary calcium score in the 82nd percentile with nonobstructive coronary disease. Cath in 2/22 showed only luminal irregularities. She denies anginal symptoms.  - Not on ASA due to Eliquis.  - Continue Vascepa and Livalo.  6. COPD:  History of smoking. Quit 2/22.  7. OSA: Continue CPAP.  8. Obesity: Body mass index is 44.03 kg/m. - On semaglutide.  Follow up in 3 months with Dr. [name].  Signed, Rafael Bihari, FNP  09/16/2021  [facility] [address]  [facility] [place] [address] [phone] (office) [phone] (fax)

## 2021-09-22 NOTE — Progress Notes (Signed)
Remote ICD transmission.   

## 2021-09-23 NOTE — Telephone Encounter (Signed)
Received signed application. I faxed to [employer] [facility] 09/22/21. ?

## 2021-09-26 ENCOUNTER — Ambulatory Visit: Payer: Medicare Other

## 2021-09-26 ENCOUNTER — Telehealth: Payer: Self-pay

## 2021-09-26 DIAGNOSIS — I5020 Unspecified systolic (congestive) heart failure: Secondary | ICD-10-CM

## 2021-09-26 DIAGNOSIS — I472 Ventricular tachycardia, unspecified: Secondary | ICD-10-CM

## 2021-09-26 DIAGNOSIS — I480 Paroxysmal atrial fibrillation: Secondary | ICD-10-CM

## 2021-09-26 DIAGNOSIS — Z794 Long term (current) use of insulin: Secondary | ICD-10-CM

## 2021-09-26 DIAGNOSIS — E1165 Type 2 diabetes mellitus with hyperglycemia: Secondary | ICD-10-CM

## 2021-09-26 MED ORDER — OZEMPIC (2 MG/DOSE) 8 MG/3ML ~~LOC~~ SOPN: 2.0000 mg | PEN_INJECTOR | SUBCUTANEOUS | 11 refills | Status: DC

## 2021-09-26 NOTE — Telephone Encounter (Signed)
Heidi Stark is requesting pt get set up for AWV.  Will you please set pt up for it.  She may want it to be a VV or telephone call. ?

## 2021-09-26 NOTE — Addendum Note (Signed)
Addended by: Marcelle Overlie D on: 09/26/2021 04:51 PM ? ? Modules accepted: Orders ? ?

## 2021-09-26 NOTE — Telephone Encounter (Signed)
Patient approved for pt assistance for [place] through the end of the year. ?Medication will not ship for 6 weeks per company. ? ?Lowest BG reading 97 ?Fasting 100-140 ?Has not checked post prandial. ?I have requested she check BG before she eats ?She has 1 more '1mg'$  injection left then she will increase to '2mg'$ .  ?Rx sent to pharmacy ?I will send new Rx to [facility] as by the time it ships she will be on '2mg'$ .  ? ?

## 2021-09-26 NOTE — Patient Instructions (Signed)
Visit Information ? ?Thank you for taking time to visit with me today. Please don't hesitate to contact me if I can be of assistance to you before our next scheduled telephone appointment. ? ?Following are the goals we discussed today:  ?Patient Goals/Self-Care Activities: ?You are due for your Annual visit/Annual Wellness visit with primary care provider. The office will be reaching out to you to schedule. Please call to schedule if you do not hear from them by the end of the week. ?Continue to take medications as prescribed. ?Per provider Limit fluid intake to less than 2 Liters/day ?Continue to attend all scheduled provider appointments ?Continue to call provider office for new concerns or questions ?Continue to be active and exercise per provider recommendation ?Continue to check your blood sugars as recommended by your provider, record and take to your provider visits. ?Continue to eat healthy: : eat plenty of vegetables, fruits, lean meats; low fat. Limit: sugars, rice, potatoes, pasta. Monitor salt intake ? ?Our next appointment is by telephone on 11/28/21 at 10 am ? ?Please call the care guide team at [phone] if you need to cancel or reschedule your appointment.  ? ?If you are experiencing a Mental Health or [place] or need someone to talk to, please call the Suicide and Crisis Lifeline: 988 ?call 1-800-273-TALK (toll free, 24 hour hotline)  ? ?Patient verbalizes understanding of instructions and care plan provided today and agrees to view in [place]. Active MyChart status confirmed with patient.   ? ?Thea Silversmith, RN, MSN, BSN, CCM ?Care Management Coordinator ?[facility] [place] ?[phone]  ?

## 2021-09-26 NOTE — Chronic Care Management (AMB) (Signed)
Chronic Care Management   CCM RN Visit Note  09/26/2021 Name: Heidi Stark MRN: [id number] DOB: Jul 15, 1941  Subjective: Heidi Stark is a 81 y.o. year old female who is a primary care patient of Ann Held, DO. The care management team was consulted for assistance with disease management and care coordination needs.    Engaged with patient by telephone for follow up visit in response to provider referral for case management and/or care coordination services.   Consent to Services:  The patient was given information about Chronic Care Management services, agreed to services, and gave verbal consent prior to initiation of services.  Please see initial visit note for detailed documentation.   Patient agreed to services and verbal consent obtained.   Assessment: Review of patient past medical history, allergies, medications, health status, including review of consultants reports, laboratory and other test data, was performed as part of comprehensive evaluation and provision of chronic care management services.   SDOH (Social Determinants of Health) assessments and interventions performed:    CCM Care Plan  Allergies  Allergen Reactions   Neomycin-Bacitracin Zn-Polymyx Rash   Sulfonamide Derivatives Other (See Comments)    Stomach cramps   Ciprofloxacin Hives, Itching and Rash    Pt doesn't remember having any reaction to cipro   Spironolactone Rash    Outpatient Encounter Medications as of 09/26/2021  Medication Sig Note   acetaminophen (TYLENOL) 500 MG tablet Take 500 mg by mouth every 6 (six) hours as needed for moderate pain or headache.    albuterol (VENTOLIN HFA) 108 (90 Base) MCG/ACT inhaler Inhale 2 puffs into the lungs every 4 (four) hours as needed for wheezing or shortness of breath.    amiodarone (PACERONE) 200 MG tablet Take 1 tablet (200 mg total) by mouth daily.    apixaban (ELIQUIS) 5 MG TABS tablet Take 1 tablet (5 mg total) by mouth 2 (two) times daily.     eplerenone (INSPRA) 25 MG tablet TAKE 1 TABLET (25 MG TOTAL) BY MOUTH DAILY.    FARXIGA 10 MG TABS tablet TAKE 1 TABLET BY MOUTH EVERY DAY BEFORE BREAKFAST 08/25/2021: Patient approve for [place] [employer] [place] patient assistance program thru 07/16/2022   fenofibrate (TRICOR) 145 MG tablet TAKE 1 TABLET BY MOUTH EVERY DAY    furosemide (LASIX) 40 MG tablet Take 2 tablets (80 mg total) by mouth 2 (two) times daily.    HUMALOG MIX 75/25 KWIKPEN (75-25) 100 UNIT/ML KwikPen Inject 30-40 Units into the skin See admin instructions. Take 40 units in the AM, then take 30 units in the afternoon per patient 09/16/2021: Approved 08/30/2021 to receive from [employer] thru 07/16/2022   icosapent Ethyl (VASCEPA) 1 g capsule TAKE 2 CAPSULES BY MOUTH TWICE A DAY    KLOR-CON M10 10 MEQ tablet TAKE 2 TABLETS BY MOUTH 2 TIMES DAILY.    LIVALO 4 MG TABS TAKE 1 TABLET BY MOUTH EVERY DAY    metolazone (ZAROXOLYN) 2.5 MG tablet Take 1 tablet by mouth every other Friday    metoprolol succinate (TOPROL XL) 25 MG 24 hr tablet Take 1 tablet (25 mg total) by mouth daily.    mexiletine (MEXITIL) 250 MG capsule Take 1 capsule (250 mg total) by mouth 2 (two) times daily.    Multiple Vitamin (MULTIVITAMIN WITH MINERALS) TABS tablet Take 1 tablet by mouth daily.    rOPINIRole (REQUIP) 0.25 MG tablet Take 2 tablets (0.5 mg total) by mouth at bedtime.    sacubitril-valsartan (ENTRESTO) 49-51 MG  Take 1 tablet by mouth 2 (two) times daily. 09/14/2021: Approved to receive from medication assistance program thru 07/16/2022   Semaglutide, 1 MG/DOSE, (OZEMPIC, 1 MG/DOSE,) 4 MG/3ML SOPN Inject 1 mg into the skin once a week.    No facility-administered encounter medications on file as of 09/26/2021.    Patient Active Problem List   Diagnosis Date Noted   COVID-19 04/15/2021   Respiratory failure with hypoxia ([place]) 63/78/5885   Systolic CHF ([place]) 02/77/4128   CKD (chronic kidney disease) stage 3, GFR 30-59 ml/min (HCC) 04/15/2021   Elevated  troponin 04/15/2021   Acute on chronic systolic (congestive) heart failure ([place]) 09/14/2020   Wide-complex tachycardia 09/07/2020   Ventricular tachyarrhythmia 09/07/2020   VT (ventricular tachycardia) 09/07/2020   Persistent atrial fibrillation ([place]) 04/30/2018   Wound of left leg 10/01/2017   Cellulitis of right lower extremity 11/27/2016   Encounter for assessment for deep vein thrombosis (DVT) 11/14/2016   Hypokalemia 08/13/2016   Constipation 08/13/2016   Type 2 diabetes mellitus with hyperglycemia, with long-term current use of insulin (HCC)    Atherosclerosis of aorta ([place]) 06/26/2016   Tobacco abuse 06/26/2016   Colitis 11/24/2014   Severe obesity (BMI >= 40) ([place]) 11/12/2013   PAF (paroxysmal atrial fibrillation) ([place]) 11/03/2013   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 10/28/2013   COLONIC POLYPS 08/16/2010   PAD (peripheral artery disease) ([place]) 08/15/2010   HLD (hyperlipidemia) 11/18/2009   MYALGIA 11/18/2009   OBESITY 05/14/2007   PULMONARY NODULE, RIGHT MIDDLE LOBE 05/14/2007   Obstructive sleep apnea 12/06/2006   Essential hypertension 12/06/2006   COPD (chronic obstructive pulmonary disease) ([place]) 12/06/2006    Conditions to be addressed/monitored:Atrial Fibrillation, CHF, HTN, COPD, DMII, and VT  Care Plan : RN Care Manager Plan of Care  Updates made by Luretha Rued, RN since 09/26/2021 12:00 AM     Problem: No plan of Care Established for Management of Chronic Disease   Priority: High     Long-Range Goal: Development of Plan of Care for Chronic Disease Managment   Start Date: 05/12/2021  Expected End Date: 11/10/2021  Priority: High  Note:   Current Barriers: Mrs. Heringer reports attending provider visits as scheduled. Denies any signs/symptoms of CHF exacerbation. She continues to check daily weights and monitors blood pressure routinely.  Last office visit with advanced Heart failure clinic on 09/16/21.She denies any  signs/symptoms of atrial fibrillation exacerbation. However reports episodes of VT 08/2021 was started on Mexiletin by EP, Dr. [name]; also taking Amiodarone.  She continues device checks remotely. She denies any signs symptoms of COPD exacerbation. She reports blood sugars range 97-170. Denies any hypoglycemic or hyperglycemic episodes. Mrs. Neils is without questions or concerns at this time. She continues to take medications as prescribed.  Annual visit and/or AWV due-discussed the importance of AWV/annual visit with PCP. Mrs. Kotara expressed understanding. RNCM called and spoke with Anderson Malta in scheduling-confirmed visit needed. Anderson Malta to call patient to schedule Chronic Disease Management support and education needs related to Atrial Fibrillation, CHF, COPD, DMII, and chronic low back pain   RNCM Clinical Goal(s):  Patient will verbalize understanding of plan for management of Atrial Fibrillation, CHF, COPD, DMII, and chronic low back pain as evidenced by self report and/or chart notation take all medications exactly as prescribed and will call provider for medication related questions as evidenced by self report and/or chart notation demonstrate ongoing self health care management ability   as evidenced by attending provider visits  as scheduled, taking medications as prescribed, contacting provider with questions and concerns as needed  through collaboration with RN Care manager, provider, and care team.   Interventions: 1:1 collaboration with primary care provider regarding development and update of comprehensive plan of care as evidenced by provider attestation and co-signature Inter-disciplinary care team collaboration (see longitudinal plan of care) Evaluation of current treatment plan related to  self management and patient's adherence to plan as established by provider  Arrythmias: AFIB/VT Interventions: (Status:  Goal on track:  Yes.) Long Term Goal Medications reviewed, reiterated  the importance of taking medications as prescribed. Encouraged to continue to attend provider appointments as recommended Reviewed upcoming appointments with patient and encouraged to continue to attend as recommended/scheduled  Hypertension Interventions: (Status:  Goal on track:  Yes.) Long Term Goal 09/26/21 Per patient-last checked 09/25/21 BP 120/70 BP Readings from Last 3 Encounters:  09/16/21 (!) 104/54  09/08/21 106/70  09/01/21 104/60  Most recent eGFR/CrCl:  Lab Results  Component Value Date   EGFR 29 (L) 09/08/2021    No components found for: CRCL Reviewed blood pressure readings with client Reviewed upcoming provider appointments Encouraged to continue to take medications as prescribed Provided positive feedback regarding self health management  Diabetes Interventions: (Status:  Goal on track:  Yes.) Long Term Goal Assessed patient's understanding of A1c goal: <8% Medications reviewed and encouraged to continue to take as prescribed Reviewed A1C goal; Fasting BS goal 80-130 and <180 after meals. Encouraged to eat healthy-low sodium, carb modified  Reviewed upcoming/scheduled appointments, encouraged to attend as recommended Lab Results  Component Value Date   HGBA1C 9.0 (H) 04/15/2021  COPD Interventions: (Status:  Goal on track:  Yes.) Long Term Goal Medications reviewed with patient and encouraged to continue to take as prescribed. Encouraged to continue to attend provider visits as scheduled  Heart Failure Interventions: Long Term: goal on track Reviewed signs/symptoms of HF exacerbation Reviewed daily weights and when to call the doctor Upcoming scheduled appointments reviewed with patient and encouraged to continue to attend provider visits as scheduled Provided positive feedback regarding self health management  Patient Goals/Self-Care Activities: You are due for your Annual visit/Annual Wellness visit with primary care provider. The office will be reaching out  to you to schedule. Please call to schedule if you do not hear from them by the end of the week. Continue to take medications as prescribed. Per provider Limit fluid intake to less than 2 Liters/day Continue to attend all scheduled provider appointments Continue to call provider office for new concerns or questions Continue to be active and exercise per provider recommendation Continue to check your blood sugars as recommended by your provider, record and take to your provider visits. Continue to eat healthy: : eat plenty of vegetables, fruits, lean meats; low fat. Limit: sugars, rice, potatoes, pasta. Monitor salt intake   Plan: Per patient request would like follow up in 2-3 months. Telephone follow up appointment with care management team member scheduled for:  11/28/21 The patient has been provided with contact information for the care management team and has been advised to call with any health related questions or concerns.   Thea Silversmith, RN, MSN, BSN, CCM Care Management Coordinator [facility] [phone]

## 2021-09-27 NOTE — Telephone Encounter (Signed)
Pt said she will call back to schedule

## 2021-09-28 ENCOUNTER — Other Ambulatory Visit: Payer: Self-pay | Admitting: Cardiology

## 2021-09-30 ENCOUNTER — Other Ambulatory Visit: Payer: Self-pay

## 2021-09-30 ENCOUNTER — Other Ambulatory Visit: Payer: Self-pay | Admitting: Cardiology

## 2021-09-30 ENCOUNTER — Ambulatory Visit (HOSPITAL_COMMUNITY)
Admission: RE | Admit: 2021-09-30 | Discharge: 2021-09-30 | Disposition: A | Discharge status: Home / Self Care | Payer: Medicare Other | Source: Ambulatory Visit | Attending: Cardiology | Admitting: Cardiology

## 2021-09-30 DIAGNOSIS — I5022 Chronic systolic (congestive) heart failure: Secondary | ICD-10-CM | POA: Diagnosis present

## 2021-09-30 LAB — BASIC METABOLIC PANEL
Anion gap: 12 (ref 5–15)
BUN: 18 mg/dL (ref 8–23)
CO2: 27 mmol/L (ref 22–32)
Calcium: 8.5 mg/dL — ABNORMAL LOW (ref 8.9–10.3)
Chloride: 97 mmol/L — ABNORMAL LOW (ref 98–111)
Creatinine, Ser: 1.49 mg/dL — ABNORMAL HIGH (ref 0.44–1.00)
GFR, Estimated: 36 mL/min — ABNORMAL LOW (ref >60)
Glucose, Bld: 125 mg/dL — ABNORMAL HIGH (ref 70–99)
Potassium: 4 mmol/L (ref 3.5–5.1)
Sodium: 136 mmol/L (ref 135–145)

## 2021-09-30 NOTE — Telephone Encounter (Signed)
Prescription refill request for Eliquis received. ?Indication: Atrial fib/flutter ?Last office visit: 09/08/21 Elliot Cousin MD ?Scr: 1.49 on 09/30/21 ?Age:  81 ?Weight: 123.9kg ? ?Based on above findings Eliquis '5mg'$  twice daily is the appropriate dose.  Will refill x 3.  Dose will need to be reevaluated at time as she will be turning 81 yo. ? ?

## 2021-10-03 ENCOUNTER — Ambulatory Visit (INDEPENDENT_AMBULATORY_CARE_PROVIDER_SITE_OTHER): Payer: Medicare Other

## 2021-10-03 DIAGNOSIS — Z95810 Presence of automatic (implantable) cardiac defibrillator: Secondary | ICD-10-CM

## 2021-10-03 DIAGNOSIS — I5022 Chronic systolic (congestive) heart failure: Secondary | ICD-10-CM

## 2021-10-03 NOTE — Progress Notes (Signed)
EPIC Encounter for ICM Monitoring ? ?Patient Name: Heidi Stark is a 81 y.o. female ?Date: 10/03/2021 ?Primary Care Physican: Ann Held, DO ?Primary Cardiologist: [name] ?Electrophysiologist: Camnitz ?10/03/2021 Weight: 265 lbs  ?     ? ?One time remote check for Allena Katz, NP at HF clinic following last OV. Heart Failure questions reviewed.  Pt asymptomatic and feels like she is doing well at this time.  Weight is stable. ?  ?Optivol thoracic impedance normal but was suggesting a few days with possible fluid accumulation.  She takes Metolazone every other Friday.  ? ?Prescribed:  ?Furosemide 40 mg take 2 tablets (80 mg total) by mouth twice a day. ?[place] 10 mEq take 2 tablets (20 mEq total) by mouth twice a day. ?Metolazone 2.5 mg take 1 tablet every other Friday. ? ?Recommendations:  Encouraged to call HF clinic for any further follow up questions or concerns.  ? ?Follow-up plan: No further ICM clinic phone appointments planned at this time.   91 day device clinic remote transmission 12/15/2021.   ? ?EP/Cardiology Office Visits: 12/15/2021 with Dr. [name].  12/20/2021 with Dr [name].   ? ?Copy of ICM check sent to Dr. [name] and Allena Katz, NP for review as requested.  ? ?3 month ICM trend: 10/03/2021. ? ? ? ?12-14 Month ICM trend:  ? ? ? ?Rosalene Billings, RN ?10/03/2021 ?4:21 PM ? ?

## 2021-10-04 ENCOUNTER — Ambulatory Visit: Payer: Medicare Other

## 2021-10-04 ENCOUNTER — Other Ambulatory Visit: Payer: Self-pay

## 2021-10-04 ENCOUNTER — Ambulatory Visit (INDEPENDENT_AMBULATORY_CARE_PROVIDER_SITE_OTHER): Payer: Medicare Other | Admitting: Pharmacist

## 2021-10-04 DIAGNOSIS — Z794 Long term (current) use of insulin: Secondary | ICD-10-CM

## 2021-10-04 DIAGNOSIS — E1165 Type 2 diabetes mellitus with hyperglycemia: Secondary | ICD-10-CM

## 2021-10-04 MED ORDER — MEXILETINE HCL 250 MG PO CAPS: 250.0000 mg | ORAL_CAPSULE | Freq: Two times a day (BID) | ORAL | 3 refills | Status: DC

## 2021-10-04 NOTE — Progress Notes (Signed)
Patient ID: Heidi Stark                 DOB: Jul 05, 1941                    MRN: [id number] ? ? ? ? ?HPI: ?Heidi Stark is a 81 y.o. female patient referred to pharmacy clinic by Dr. [name] to initiate weight loss therapy with GLP1-RA. PMH is significant for obesity complicated by chronic medical conditions including HTN, DM, hyperlipidemia, splenic and renal infarcts, afib, CHF. Most recent BMI 43.39. ? ?Today's visit was done via telephone. ? ?At last visit, about 3 months ago, patient was started on Ozempic 0.'25mg'$  weekly. She is now up to Ozempic '1mg'$  weekly. She has one more dose of '1mg'$  and then will be increasing to '2mg'$ . She was approved for patient assistance recently.  ?She takes humalog 75/25 mix twice a day. Checks her blood sugar in the AM and before her evening meal. Blood sugar in AM is 110 and PM about 140.  ?She has not missed any doses. Sometimes feels nauseous in middle of night but then goes away. States she does not eat all that much. Not very hungry. She has been trying to walk around the house more. Mobility limited by back pain. ? ?Per patient report she weights 264lb per home scale. ? ?Current weight management medications: none ? ?Previously tried meds: none ? ?Current meds that may affect weight: insulin ? ?Baseline weight/BMI: 268.8 lb/  ? ?Insurance payor: [employer] ? ?Diet:  ?-Breakfast: bowl of [place] bran and a piece of toast ?-Lunch: salad (lettuce, tomato, [place], sometime ham) ?-Dinner: sometimes doesn't eat dinner, other times BBQ, chicken dish ?-Snacks: none ?-Drinks: crystal light, water ? ?Exercise: some physical fitness exercise ? ?Family History: The patient's family history includes Cancer in her mother; Diabetes in her father, mother, and another family member; Factor V Leiden deficiency in her daughter; Hypertension in her mother; Melanoma in an other family member; Obesity in her father and mother. ? ?Social History: The patient  reports that she  has quit smoking. Her smoking use included cigarettes. She has a 50.00 pack-year smoking history. She has never used smokeless tobacco. She reports that she does not drink alcohol and does not use drugs.  ?  ? ?Labs: ?Lab Results  ?Component Value Date  ? HGBA1C 9.0 (H) 04/15/2021  ? ? ?Wt Readings from Last 1 Encounters:  ?09/16/21 272 lb 12.8 oz (123.7 kg)  ? ? ?BP Readings from Last 1 Encounters:  ?09/16/21 (!) 104/54  ? ?Pulse Readings from Last 1 Encounters:  ?09/16/21 (!) 54  ? ? ?   ?Component Value Date/Time  ? CHOL 186 01/27/2021 1146  ? CHOL 170 06/02/2019 0913  ? TRIG 292 (H) 01/27/2021 1146  ? HDL 34 (L) 01/27/2021 1146  ? HDL 31 (L) 06/02/2019 0913  ? CHOLHDL 5.5 01/27/2021 1146  ? VLDL 58 (H) 01/27/2021 1146  ? [place] 94 01/27/2021 1146  ? [place] 92 06/02/2019 0913  ? LDLCALC 77 06/28/2018 1410  ? LDLDIRECT 42.0 11/30/2014 1226  ? ? ?Past Medical History:  ?Diagnosis Date  ? A-fib ([place])   ? Arthritis   ? "hands, wrists" (11/07/2016)  ? CHF (congestive heart failure) ([place])   ? Chronic lower back pain   ? Constipation   ? COPD (chronic obstructive pulmonary disease) ([place])   ? DVT (deep venous thrombosis) ([place])   ? Dyspnea   ? History of blood  transfusion 1990  ? "related to OR"  ? History of kidney stones   ? Hyperlipidemia   ? Hypertension   ? Joint pain   ? Lower extremity edema   ? OSA on CPAP   ? Persistent atrial fibrillation ([place])   ? Pneumonia   ? "several times" (11/07/2016)  ? Type II diabetes mellitus ([place])   ? ? ?Current Outpatient Medications on File Prior to Visit  ?Medication Sig Dispense Refill  ? acetaminophen (TYLENOL) 500 MG tablet Take 500 mg by mouth every 6 (six) hours as needed for moderate pain or headache.    ? albuterol (VENTOLIN HFA) 108 (90 Base) MCG/ACT inhaler Inhale 2 puffs into the lungs every 4 (four) hours as needed for wheezing or shortness of breath. 8 g 0  ? amiodarone (PACERONE) 200 MG tablet Take 1 tablet (200 mg total) by mouth daily. 90 tablet 3  ? apixaban  (ELIQUIS) 5 MG TABS tablet TAKE 1 TABLET BY MOUTH TWICE A DAY 60 tablet 3  ? eplerenone (INSPRA) 25 MG tablet TAKE 1 TABLET (25 MG TOTAL) BY MOUTH DAILY. 90 tablet 1  ? FARXIGA 10 MG TABS tablet TAKE 1 TABLET BY MOUTH EVERY DAY BEFORE BREAKFAST 30 tablet 6  ? fenofibrate (TRICOR) 145 MG tablet TAKE 1 TABLET BY MOUTH EVERY DAY 90 tablet 3  ? furosemide (LASIX) 40 MG tablet Take 2 tablets (80 mg total) by mouth 2 (two) times daily. 270 tablet 3  ? HUMALOG MIX 75/25 KWIKPEN (75-25) 100 UNIT/ML KwikPen Inject 30-40 Units into the skin See admin instructions. Take 40 units in the AM, then take 30 units in the afternoon per patient    ? icosapent Ethyl (VASCEPA) 1 g capsule TAKE 2 CAPSULES BY MOUTH TWICE A DAY 120 capsule 5  ? KLOR-CON M10 10 MEQ tablet TAKE 2 TABLETS BY MOUTH 2 TIMES DAILY. 360 tablet 3  ? LIVALO 4 MG TABS TAKE 1 TABLET BY MOUTH EVERY DAY 90 tablet 3  ? metolazone (ZAROXOLYN) 2.5 MG tablet Take 1 tablet by mouth every other Friday 2 tablet 5  ? metoprolol succinate (TOPROL XL) 25 MG 24 hr tablet Take 1 tablet (25 mg total) by mouth daily. 30 tablet 6  ? mexiletine (MEXITIL) 250 MG capsule Take 1 capsule (250 mg total) by mouth 2 (two) times daily. 60 capsule 3  ? Multiple Vitamin (MULTIVITAMIN WITH MINERALS) TABS tablet Take 1 tablet by mouth daily.    ? rOPINIRole (REQUIP) 0.25 MG tablet Take 2 tablets (0.5 mg total) by mouth at bedtime. 180 tablet 0  ? sacubitril-valsartan (ENTRESTO) 49-51 MG Take 1 tablet by mouth 2 (two) times daily. 60 tablet 3  ? Semaglutide, 2 MG/DOSE, (OZEMPIC, 2 MG/DOSE,) 8 MG/3ML SOPN Inject 2 mg into the skin once a week. 3 mL 11  ? ?No current facility-administered medications on file prior to visit.  ? ? ?Allergies  ?Allergen Reactions  ? Neomycin-Bacitracin Zn-Polymyx Rash  ? Sulfonamide Derivatives Other (See Comments)  ?  Stomach cramps  ? Ciprofloxacin Hives, Itching and Rash  ?  Pt doesn't remember having any reaction to cipro  ? Spironolactone Rash   ? ? ? ?Assessment/Plan: ? ?1. Weight loss - Patient does not appear to have lost much weight. However, her blood sugars seem to be improved. BG is now 110-140 instead of 140-170. Continue titration of Ozempic to '2mg'$  weekly. Follow up via telephone in 1 month. ? ?Thank you, ? ?Ramond Dial, Pharm.D, BCPS, CPP ?[facility]  ?[address] [place], [place] [address]  ?Phone: [phone]; Fax: [phone]  ? ?

## 2021-10-12 ENCOUNTER — Other Ambulatory Visit (HOSPITAL_COMMUNITY): Payer: Self-pay | Admitting: Cardiology

## 2021-10-13 ENCOUNTER — Ambulatory Visit (INDEPENDENT_AMBULATORY_CARE_PROVIDER_SITE_OTHER): Payer: Medicare Other

## 2021-10-13 VITALS — Ht 66.0 in | Wt 269.0 lb

## 2021-10-13 DIAGNOSIS — Z0000 Encounter for general adult medical examination without abnormal findings: Secondary | ICD-10-CM | POA: Diagnosis not present

## 2021-10-13 DIAGNOSIS — E1165 Type 2 diabetes mellitus with hyperglycemia: Secondary | ICD-10-CM | POA: Insufficient documentation

## 2021-10-13 DIAGNOSIS — E1121 Type 2 diabetes mellitus with diabetic nephropathy: Secondary | ICD-10-CM | POA: Insufficient documentation

## 2021-10-13 DIAGNOSIS — E7800 Pure hypercholesterolemia, unspecified: Secondary | ICD-10-CM | POA: Insufficient documentation

## 2021-10-13 DIAGNOSIS — R809 Proteinuria, unspecified: Secondary | ICD-10-CM | POA: Insufficient documentation

## 2021-10-13 NOTE — Patient Instructions (Signed)
Heidi Stark , ?Thank you for taking time to come for your Medicare Wellness Visit. I appreciate your ongoing commitment to your health goals. Please review the following plan we discussed and let me know if I can assist you in the future.  ? ?Screening recommendations/referrals: ?Colonoscopy: No longer required due to age. ?Mammogram: No longer required due to age.  ?Bone Density: Declined. ? ?Recommended yearly ophthalmology/optometry visit for glaucoma screening and checkup ?Recommended yearly dental visit for hygiene and checkup ? ?Vaccinations: ?Influenza vaccine: Declined. ?Pneumococcal vaccine: Done 11/29/2011 and 10/11/2015 ?Tdap vaccine: Done 06/28/2018 Repeat in 10 years ? ?Shingles vaccine: Done 11/11/2008. Discussed Shingrix.   ?Covid-19:Declined. ? ?Advanced directives: Please bring a copy of your health care power of attorney and living will to the office to be added to your chart at your convenience. ? ? ?Conditions/risks identified: Aim for 30 minutes of exercise, 6-8 glasses of water, and 5 servings of fruits and vegetables each day. ? ? ?Next appointment: Follow up in one year for your annual wellness visit 2024. ? ? ?Preventive Care 27 Years and Older, Female ?Preventive care refers to lifestyle choices and visits with your health care provider that can promote health and wellness. ?What does preventive care include? ?A yearly physical exam. This is also called an annual well check. ?Dental exams once or twice a year. ?Routine eye exams. Ask your health care provider how often you should have your eyes checked. ?Personal lifestyle choices, including: ?Daily care of your teeth and gums. ?Regular physical activity. ?Eating a healthy diet. ?Avoiding tobacco and drug use. ?Limiting alcohol use. ?Practicing safe sex. ?Taking low-dose aspirin every day. ?Taking vitamin and mineral supplements as recommended by your health care provider. ?What happens during an annual well check? ?The services and screenings  done by your health care provider during your annual well check will depend on your age, overall health, lifestyle risk factors, and family history of disease. ?Counseling  ?Your health care provider may ask you questions about your: ?Alcohol use. ?Tobacco use. ?Drug use. ?Emotional well-being. ?Home and relationship well-being. ?Sexual activity. ?Eating habits. ?History of falls. ?Memory and ability to understand (cognition). ?Work and work Statistician. ?Reproductive health. ?Screening  ?You may have the following tests or measurements: ?Height, weight, and BMI. ?Blood pressure. ?Lipid and cholesterol levels. These may be checked every 5 years, or more frequently if you are over 41 years old. ?Skin check. ?Lung cancer screening. You may have this screening every year starting at age 26 if you have a 30-pack-year history of smoking and currently smoke or have quit within the past 15 years. ?Fecal occult blood test (FOBT) of the stool. You may have this test every year starting at age 22. ?Flexible sigmoidoscopy or colonoscopy. You may have a sigmoidoscopy every 5 years or a colonoscopy every 10 years starting at age 15. ?Hepatitis C blood test. ?Hepatitis B blood test. ?Sexually transmitted disease (STD) testing. ?Diabetes screening. This is done by checking your blood sugar (glucose) after you have not eaten for a while (fasting). You may have this done every 1-3 years. ?Bone density scan. This is done to screen for osteoporosis. You may have this done starting at age 35. ?Mammogram. This may be done every 1-2 years. Talk to your health care provider about how often you should have regular mammograms. ?Talk with your health care provider about your test results, treatment options, and if necessary, the need for more tests. ?Vaccines  ?Your health care provider may recommend certain vaccines, such  as: ?Influenza vaccine. This is recommended every year. ?Tetanus, diphtheria, and acellular pertussis (Tdap, Td)  vaccine. You may need a Td booster every 10 years. ?Zoster vaccine. You may need this after age 23. ?Pneumococcal 13-valent conjugate (PCV13) vaccine. One dose is recommended after age 48. ?Pneumococcal polysaccharide (PPSV23) vaccine. One dose is recommended after age 34. ?Talk to your health care provider about which screenings and vaccines you need and how often you need them. ?This information is not intended to replace advice given to you by your health care provider. Make sure you discuss any questions you have with your health care provider. ?Document Released: 07/30/2015 Document Revised: 03/22/2016 Document Reviewed: 05/04/2015 ?Elsevier Interactive Patient Education ? 2017 [place]. ? ?Fall Prevention in the Home ?Falls can cause injuries. They can happen to people of all ages. There are many things you can do to make your home safe and to help prevent falls. ?What can I do on the outside of my home? ?Regularly fix the edges of walkways and driveways and fix any cracks. ?Remove anything that might make you trip as you walk through a door, such as a raised step or threshold. ?Trim any bushes or trees on the path to your home. ?Use bright outdoor lighting. ?Clear any walking paths of anything that might make someone trip, such as rocks or tools. ?Regularly check to see if handrails are loose or broken. Make sure that both sides of any steps have handrails. ?Any raised decks and porches should have guardrails on the edges. ?Have any leaves, snow, or ice cleared regularly. ?Use sand or salt on walking paths during winter. ?Clean up any spills in your garage right away. This includes oil or grease spills. ?What can I do in the bathroom? ?Use night lights. ?Install grab bars by the toilet and in the tub and shower. Do not use towel bars as grab bars. ?Use non-skid mats or decals in the tub or shower. ?If you need to sit down in the shower, use a plastic, non-slip stool. ?Keep the floor dry. Clean up any  water that spills on the floor as soon as it happens. ?Remove soap buildup in the tub or shower regularly. ?Attach bath mats securely with double-sided non-slip rug tape. ?Do not have throw rugs and other things on the floor that can make you trip. ?What can I do in the bedroom? ?Use night lights. ?Make sure that you have a light by your bed that is easy to reach. ?Do not use any sheets or blankets that are too big for your bed. They should not hang down onto the floor. ?Have a firm chair that has side arms. You can use this for support while you get dressed. ?Do not have throw rugs and other things on the floor that can make you trip. ?What can I do in the kitchen? ?Clean up any spills right away. ?Avoid walking on wet floors. ?Keep items that you use a lot in easy-to-reach places. ?If you need to reach something above you, use a strong step stool that has a grab bar. ?Keep electrical cords out of the way. ?Do not use floor polish or wax that makes floors slippery. If you must use wax, use non-skid floor wax. ?Do not have throw rugs and other things on the floor that can make you trip. ?What can I do with my stairs? ?Do not leave any items on the stairs. ?Make sure that there are handrails on both sides of the stairs and use  them. Fix handrails that are broken or loose. Make sure that handrails are as long as the stairways. ?Check any carpeting to make sure that it is firmly attached to the stairs. Fix any carpet that is loose or worn. ?Avoid having throw rugs at the top or bottom of the stairs. If you do have throw rugs, attach them to the floor with carpet tape. ?Make sure that you have a light switch at the top of the stairs and the bottom of the stairs. If you do not have them, ask someone to add them for you. ?What else can I do to help prevent falls? ?Wear shoes that: ?Do not have high heels. ?Have rubber bottoms. ?Are comfortable and fit you well. ?Are closed at the toe. Do not wear sandals. ?If you use a  stepladder: ?Make sure that it is fully opened. Do not climb a closed stepladder. ?Make sure that both sides of the stepladder are locked into place. ?Ask someone to hold it for you, if possible. ?Clearly mark and

## 2021-10-13 NOTE — Addendum Note (Signed)
Addended by: Marcelle Overlie D on: 10/13/2021 11:28 AM ? ? Modules accepted: Level of Service ? ?

## 2021-10-13 NOTE — Progress Notes (Signed)
? ?Subjective:  ? Heidi Stark is a 81 y.o. female who presents for Medicare Annual (Subsequent) preventive examination. ?Virtual Visit via Telephone Note ? ?I connected with  Heidi Stark on 10/13/21 at  3:15 PM EDT by telephone and verified that I am speaking with the correct person using two identifiers. ? ?Location: ?Patient: HOME ?Provider: [facility] ?Persons participating in the virtual visit: patient/Nurse Health Advisor ?  ?I discussed the limitations, risks, security and privacy concerns of performing an evaluation and management service by telephone and the availability of in person appointments. The patient expressed understanding and agreed to proceed. ? ?Interactive audio and video telecommunications were attempted between this nurse and patient, however failed, due to patient having technical difficulties OR patient did not have access to video capability.  We continued and completed visit with audio only. ? ?Some vital signs may be absent or patient reported.  ? ?Chriss Driver, LPN ? ?Review of Systems    ? ?Cardiac Risk Factors include: advanced age (>7mn, >>80women);diabetes mellitus;dyslipidemia;hypertension;sedentary lifestyle;obesity (BMI >30kg/m2);Other (see comment), Risk factor comments: DDD ? ?   ?Objective:  ?  ?Today's Vitals  ? 10/13/21 1511  ?Weight: 269 lb (122 kg)  ?Height: '5\' 6"'$  (1.676 m)  ? ?Body mass index is 43.42 kg/m?. ? ? ?  10/13/2021  ?  3:28 PM 04/16/2021  ? 12:55 AM 04/15/2021  ?  2:24 AM 10/11/2020  ? 11:19 AM 09/12/2020  ?  5:00 PM 09/08/2020  ?  4:43 PM 09/07/2020  ?  9:35 AM  ?Advanced Directives  ?Does Patient Have a Medical Advance Directive? No No No No No  No  ?Would patient like information on creating a medical advance directive? No - Patient declined No - Patient declined No - Patient declined No - Patient declined No - Patient declined No - Patient declined No - Patient declined  ? ? ?Current Medications (verified) ?Outpatient Encounter Medications as of  10/13/2021  ?Medication Sig  ? acetaminophen (TYLENOL) 500 MG tablet Take 500 mg by mouth every 6 (six) hours as needed for moderate pain or headache.  ? albuterol (VENTOLIN HFA) 108 (90 Base) MCG/ACT inhaler Inhale 2 puffs into the lungs every 4 (four) hours as needed for wheezing or shortness of breath.  ? amiodarone (PACERONE) 200 MG tablet Take 1 tablet (200 mg total) by mouth daily.  ? apixaban (ELIQUIS) 5 MG TABS tablet TAKE 1 TABLET BY MOUTH TWICE A DAY  ? BD PEN NEEDLE NANO 2ND GEN 32G X 4 MM MISC See admin instructions.  ? eplerenone (INSPRA) 25 MG tablet TAKE 1 TABLET (25 MG TOTAL) BY MOUTH DAILY.  ? FARXIGA 10 MG TABS tablet TAKE 1 TABLET BY MOUTH EVERY DAY BEFORE BREAKFAST  ? fenofibrate (TRICOR) 145 MG tablet TAKE 1 TABLET BY MOUTH EVERY DAY  ? furosemide (LASIX) 40 MG tablet Take 1 tablet (40 mg total) by mouth 2 (two) times daily.  ? HUMALOG MIX 75/25 KWIKPEN (75-25) 100 UNIT/ML KwikPen Inject 30-40 Units into the skin See admin instructions. Take 40 units in the AM, then take 30 units in the afternoon per patient  ? icosapent Ethyl (VASCEPA) 1 g capsule TAKE 2 CAPSULES BY MOUTH TWICE A DAY  ? KLOR-CON M10 10 MEQ tablet TAKE 2 TABLETS BY MOUTH 2 TIMES DAILY.  ? LIVALO 4 MG TABS TAKE 1 TABLET BY MOUTH EVERY DAY  ? metolazone (ZAROXOLYN) 2.5 MG tablet Take 1 tablet by mouth every other Friday  ? metoprolol succinate (  TOPROL XL) 25 MG 24 hr tablet Take 1 tablet (25 mg total) by mouth daily.  ? mexiletine (MEXITIL) 250 MG capsule Take 1 capsule (250 mg total) by mouth 2 (two) times daily.  ? Multiple Vitamin (MULTIVITAMIN WITH MINERALS) TABS tablet Take 1 tablet by mouth daily.  ? ONETOUCH VERIO test strip 1 each 3 (three) times daily.  ? rOPINIRole (REQUIP) 0.25 MG tablet Take 2 tablets (0.5 mg total) by mouth at bedtime.  ? sacubitril-valsartan (ENTRESTO) 49-51 MG Take 1 tablet by mouth 2 (two) times daily.  ? Semaglutide, 2 MG/DOSE, (OZEMPIC, 2 MG/DOSE,) 8 MG/3ML SOPN Inject 2 mg into the skin once a  week.  ? ?No facility-administered encounter medications on file as of 10/13/2021.  ? ? ?Allergies (verified) ?Neomycin-bacitracin zn-polymyx, Sulfonamide derivatives, Ciprofloxacin, and Spironolactone  ? ?History: ?Past Medical History:  ?Diagnosis Date  ? A-fib ([place])   ? Arthritis   ? "hands, wrists" (11/07/2016)  ? CHF (congestive heart failure) ([place])   ? Chronic lower back pain   ? Constipation   ? COPD (chronic obstructive pulmonary disease) ([place])   ? DVT (deep venous thrombosis) ([place])   ? Dyspnea   ? History of blood transfusion 1990  ? "related to OR"  ? History of kidney stones   ? Hyperlipidemia   ? Hypertension   ? Joint pain   ? Lower extremity edema   ? OSA on CPAP   ? Persistent atrial fibrillation ([place])   ? Pneumonia   ? "several times" (11/07/2016)  ? Type II diabetes mellitus ([place])   ? ?Past Surgical History:  ?Procedure Laterality Date  ? ATRIAL FIBRILLATION ABLATION N/A 11/07/2016  ? Procedure: Atrial Fibrillation Ablation;  Surgeon: [name], MD;  Location: [place] [facility];  Service: Cardiovascular;  Laterality: N/A;  ? BACK SURGERY    ? CARDIAC CATHETERIZATION    ? CARDIOVERSION N/A 08/11/2016  ? Procedure: CARDIOVERSION;  Surgeon: Larey Dresser, MD;  Location: [place];  Service: Cardiovascular;  Laterality: N/A;  ? CARDIOVERSION N/A 12/03/2017  ? Procedure: CARDIOVERSION;  Surgeon: Larey Dresser, MD;  Location[facility] ENDOSCOPY;  Service: Cardiovascular;  Laterality: N/A;  ? CARDIOVERSION N/A 05/02/2018  ? Procedure: CARDIOVERSION;  Surgeon: Pixie Casino, MD;  Location: [place];  Service: Cardiovascular;  Laterality: N/A;  ? CARDIOVERSION N/A 06/23/2020  ? Procedure: CARDIOVERSION;  Surgeon: [name], MD;  Location: [facility] ENDOSCOPY;  Service: Cardiovascular;  Laterality: N/A;  ? CARDIOVERSION N/A 10/11/2020  ? Procedure: CARDIOVERSION;  Surgeon: Larey Dresser, MD;  Location: [facility] ENDOSCOPY;  Service: Cardiovascular;  Laterality: N/A;  ? CARPAL TUNNEL RELEASE    ?  CATARACT EXTRACTION W/ INTRAOCULAR LENS  IMPLANT, BILATERAL Bilateral   ? COLON SURGERY  1990  ? vein graft and colon repair after nicked artery with back surgert  ? COLONOSCOPY    ? CORONARY ANGIOGRAPHY N/A 09/08/2020  ? Procedure: CORONARY ANGIOGRAPHY;  Surgeon: Larey Dresser, MD;  Location: [place] [facility];  Service: Cardiovascular;  Laterality: N/A;  ? CYSTECTOMY    ? between bladder and kidneys  ? GANGLION CYST EXCISION Left   ? ICD IMPLANT N/A 09/15/2020  ? Procedure: ICD IMPLANT;  Surgeon: Constance Haw, MD;  Location: [place] [facility];  Service: Cardiovascular;  Laterality: N/A;  ? KNEE CARTILAGE SURGERY Right 1960s  ? [place] SURGERY  1990  ? REPAIR ILIAC ARTERY  1990  ? vein graft and colon repair after nicked artery with back surgert  ?  TEE WITHOUT CARDIOVERSION N/A 10/31/2013  ? Procedure: TRANSESOPHAGEAL ECHOCARDIOGRAM (TEE);  Surgeon: Larey Dresser, MD;  Location: [place];  Service: Cardiovascular;  Laterality: N/A;  ? TUBAL LIGATION    ? ?Family History  ?Problem Relation Age of Onset  ? Diabetes Mother   ? Hypertension Mother   ? Cancer Mother   ? Obesity Mother   ? Diabetes Father   ? Obesity Father   ? Diabetes Other   ? Melanoma Other   ? Factor V Leiden deficiency Daughter   ? ?Social History  ? ?Socioeconomic History  ? Marital status: Married  ?  Spouse name: [name]  ? Number of children: 2  ? Years of education: Not on file  ? Highest education level: Not on file  ?Occupational History  ? Occupation: Retired  ?Tobacco Use  ? Smoking status: Every Day  ?  Packs/day: 1.00  ?  Years: 50.00  ?  Pack years: 50.00  ?  Types: Cigarettes  ?  Last attempt to quit: 08/17/2020  ?  Years since quitting: 1.1  ? Smokeless tobacco: Never  ? Tobacco comments:  ?  Pt started smoking again  ?Vaping Use  ? Vaping Use: Never used  ?Substance and Sexual Activity  ? Alcohol use: No  ?  Alcohol/week: 0.0 standard drinks  ? Drug use: No  ? Sexual activity: Not on file  ?Other Topics Concern  ?  Not on file  ?Social History Narrative  ? Married x 67 years.   ? 1 son and 1 daughter.  ? 1 grandson.  ? ?Social Determinants of Health  ? ?Financial Resource Strain: Medium Risk  ? Difficulty of Paying Living

## 2021-10-14 DIAGNOSIS — I480 Paroxysmal atrial fibrillation: Secondary | ICD-10-CM

## 2021-10-14 DIAGNOSIS — I10 Essential (primary) hypertension: Secondary | ICD-10-CM | POA: Diagnosis not present

## 2021-10-14 DIAGNOSIS — E785 Hyperlipidemia, unspecified: Secondary | ICD-10-CM | POA: Diagnosis not present

## 2021-10-14 DIAGNOSIS — E1165 Type 2 diabetes mellitus with hyperglycemia: Secondary | ICD-10-CM | POA: Diagnosis not present

## 2021-10-14 DIAGNOSIS — Z794 Long term (current) use of insulin: Secondary | ICD-10-CM

## 2021-10-14 DIAGNOSIS — I5020 Unspecified systolic (congestive) heart failure: Secondary | ICD-10-CM

## 2021-10-18 ENCOUNTER — Telehealth: Payer: Self-pay | Admitting: Family Medicine

## 2021-10-18 NOTE — Telephone Encounter (Signed)
[facility] states that they have faxed over forms in order to get the patient to re-qualify for oxygen. They need office notes within the last 12 months with indication that patient qualifies for oxygen. Notes can be faxed to [phone]. Please advise.  ?

## 2021-10-19 NOTE — Telephone Encounter (Signed)
Returned call. Advised patient hasn't been seen in office since 2021 but had a virtual on 05/03/2021. [employer] requested the notes from the virtual visit be sent. Notes faxed ?

## 2021-10-21 ENCOUNTER — Telehealth: Payer: Self-pay | Admitting: Pharmacist

## 2021-10-21 NOTE — Telephone Encounter (Signed)
Ozempic '2mg'$  arrived from patient assistance today. Patient made aware it was here and she could pick it up. ?

## 2021-10-30 ENCOUNTER — Other Ambulatory Visit: Payer: Self-pay | Admitting: Family Medicine

## 2021-11-01 ENCOUNTER — Ambulatory Visit (INDEPENDENT_AMBULATORY_CARE_PROVIDER_SITE_OTHER): Payer: Medicare Other | Admitting: Pharmacist

## 2021-11-01 DIAGNOSIS — I5020 Unspecified systolic (congestive) heart failure: Secondary | ICD-10-CM

## 2021-11-01 DIAGNOSIS — E1165 Type 2 diabetes mellitus with hyperglycemia: Secondary | ICD-10-CM

## 2021-11-01 DIAGNOSIS — I10 Essential (primary) hypertension: Secondary | ICD-10-CM

## 2021-11-01 DIAGNOSIS — E785 Hyperlipidemia, unspecified: Secondary | ICD-10-CM

## 2021-11-01 DIAGNOSIS — I480 Paroxysmal atrial fibrillation: Secondary | ICD-10-CM

## 2021-11-01 NOTE — Chronic Care Management (AMB) (Signed)
? ? ?Chronic Care Management ?Pharmacy Note ? ?11/07/2021 ?Name:  Heidi Stark MRN:  [id number] DOB:  1940-12-03 ? ?Summary: ?Patient reports today that she has receched Medicare coverage gap already for 2023.  ?She has been approved for medication assistance program for [name] and Humalog 75/25 Mix  - thru 07/16/2022. She also has recently been approved and received Ozempic 48m from patient assistance program and approved thru 06/15/2022.  Weight has not decreased significantly but blood glucose is much improved. If blood glucose starts to decrease to <90, will consider lowering dose of Humalog to prevent hypoglycemia.  ?LDL not at goal - due to have lipids rechecked with next labs. IF LDL still >70 consider adding ezetimibe 162mdaily. ?Patient is taking amiodarone for atrial fibrillation. Last TSH was elevated at 8.131 on 09/16/2021 but stable compared to previous TSH of 8.995 (05/30/2021). T4 on 05/30/2021 was WNL at 1.12. Recommend check T4 with next TSH in 3 to 6 months.  ? ?Subjective: ?Heidi Stark an 81 year old female who is a primary patient of [name].  The CCM team was consulted for assistance with disease management and care coordination needs.   ? ?Engaged with patient by telephone for follow up visit in response to provider referral for pharmacy case management and/or care coordination services.  ? ?Consent to Services:  ?The patient was given information about Chronic Care Management services, agreed to services, and gave verbal consent prior to initiation of services.  Please see initial visit note for detailed documentation.  ? ?Patient Care Team: ?[name] as PCP - General ?CaConstance HawMD as PCP - Electrophysiology (Cardiology) ?[name] as Consulting Physician (Ophthalmology) ?[name] as Consulting Physician (Endocrinology) ?[name] as Case Manager ?[name] (Pharmacist) ?McLarey DresserMD as Consulting Physician (Cardiology) ? ? ?Objective: ? ?Lab Results  ?Component Value Date  ? CREATININE 1.49 (H) 09/30/2021  ? CREATININE 1.39 (H) 09/16/2021  ? CREATININE 1.75 (H) 09/08/2021  ? ? ?Lab Results  ?Component Value Date  ? HGBA1C 9.0 (H) 04/15/2021  ? ?Last diabetic Eye exam:  ?Lab Results  ?Component Value Date/Time  ? HMDIABEYEEXA No Retinopathy 08/17/2020 12:00 AM  ?  ?Last diabetic Foot exam: No results found for: HMDIABFOOTEX  ? ?   ?Component Value Date/Time  ? CHOL 186 01/27/2021 1146  ? CHOL 170 06/02/2019 0913  ? TRIG 292 (H) 01/27/2021 1146  ? HDL 34 (L) 01/27/2021 1146  ? HDL 31 (L) 06/02/2019 0913  ? CHOLHDL 5.5 01/27/2021 1146  ? VLDL 58 (H) 01/27/2021 1146  ? LDCaled[place]4 01/27/2021 1146  ? LDMunday2 06/02/2019 0913  ? LDLCALC 77 06/28/2018 1410  ? LDLDIRECT 42.0 11/30/2014 1226  ? ? ? ?  Latest Ref Rng & Units 06/24/2021  ? 11:21 AM 05/30/2021  ? 11:42 AM 04/23/2021  ?  1:17 AM  ?Hepatic Function  ?Total Protein 6.0 - 8.5 g/dL 7.4   7.6   6.1    ?Albumin 3.7 - 4.7 g/dL 4.3   3.6   2.5    ?AST 0 - 40 IU/L 39   39   32    ?ALT 0 - 32 IU/L _0 ?Alk Phosphatase 44 - 121 IU/L 81   62   45    ?Total Bilirubin 0.0 - 1.2 mg/dL 0.3   0.6   0.6    ? ? ?  Lab Results  ?Component Value Date/Time  ? TSH 8.131 (H) 09/16/2021 11:32 AM  ? TSH 8.995 (H) 05/30/2021 11:23 AM  ? TSH 5.510 (H) 12/20/2020 02:17 PM  ? TSH 1.740 09/09/2018 01:26 PM  ? FREET4 1.12 05/30/2021 11:23 AM  ? FREET4 1.51 12/20/2020 02:17 PM  ? ? ? ?  Latest Ref Rng & Units 04/23/2021  ?  1:17 AM 04/22/2021  ?  1:46 AM 04/21/2021  ?  1:41 AM  ?CBC  ?WBC 4.0 - 10.5 K/uL 9.0   8.7   10.1    ?Hemoglobin 12.0 - 15.0 g/dL 14.1   14.6   14.4    ?Hematocrit 36.0 - 46.0 % 42.9   45.1   45.1    ?Platelets 150 - 400 K/uL 240   179   213    ? ? ?Lab Results  ?Component Value Date/Time  ? VD25OH 4.3 (L) 09/09/2018 01:26 PM  ? VD25OH 22 (L) 11/18/2009 08:27 PM  ? ? ?Clinical ASCVD: Yes  ?The 10-year ASCVD risk score (Arnett DK, et  al., 2019) is: 44.5% ?  Values used to calculate the score: ?    Age: 81 years ?    Sex: Female ?    Is Non-Hispanic African American: No ?    Diabetic: Yes ?    Tobacco smoker: Yes ?    Systolic Blood Pressure: 381 mmHg ?    Is BP treated: Yes ?    HDL Cholesterol: 34 mg/dL ?    Total Cholesterol: 186 mg/dL   ? ?Other: CHADS2VASc = 6 ? ?Social History  ? ?Tobacco Use  ?Smoking Status Every Day  ? Packs/day: 1.00  ? Years: 50.00  ? Pack years: 50.00  ? Types: Cigarettes  ? Last attempt to quit: 08/17/2020  ? Years since quitting: 1.2  ?Smokeless Tobacco Never  ?Tobacco Comments  ? Pt started smoking again  ? ?BP Readings from Last 3 Encounters:  ?09/16/21 (!) 104/54  ?09/08/21 106/70  ?09/01/21 104/60  ? ?Pulse Readings from Last 3 Encounters:  ?09/16/21 (!) 54  ?09/08/21 (!) 59  ?09/01/21 (!) 56  ? ?Wt Readings from Last 3 Encounters:  ?10/13/21 269 lb (122 kg)  ?09/16/21 272 lb 12.8 oz (123.7 kg)  ?09/08/21 273 lb 3.2 oz (123.9 kg)  ? ? ?Assessment: Review of patient past medical history, allergies, medications, health status, including review of consultants reports, laboratory and other test data, was performed as part of comprehensive evaluation and provision of chronic care management services.  ? ?SDOH:  (Social Determinants of Health) assessments and interventions performed:  ? ? ? ?CCM Care Plan ? ?Allergies  ?Allergen Reactions  ? Neomycin-Bacitracin Zn-Polymyx Rash  ? Sulfonamide Derivatives Other (See Comments)  ?  Stomach cramps  ? Ciprofloxacin Hives, Itching and Rash  ?  Pt doesn't remember having any reaction to cipro  ? Spironolactone Rash  ? ? ?Medications Reviewed Today   ? ? Reviewed by [name], PharmD, CLINICAL PHARMACIST PRACTITIONER 09/14/2021 Med List Status: <None>  ? ?Medication Order Taking? Sig Documenting Provider Last Dose Status Informant  ?acetaminophen (TYLENOL) 500 MG tablet [id number] Yes Take 500 mg by mouth every 6 (six) hours as needed for moderate pain or headache. [provider] Taking Active Self  ?albuterol (VENTOLIN HFA) 108 (90 Base) MCG/ACT inhaler [id number] Yes Inhale 2 puffs into the lungs every 4 (four) hours as needed for wheezing or shortness of breath. Geradine Girt, DO Taking Active   ?amiodarone (PACERONE) 200  MG tablet [id number] Yes Take 1 tablet (200 mg total) by mouth daily. Shirley Friar, PA-C Taking Active   ?apixaban (ELIQUIS) 5 MG TABS tablet [id number] Yes Take 1 tablet (5 mg total) by mouth 2 (two) times daily. Lyda Jester M, PA-C Taking Active Self  ?eplerenone (INSPRA) 25 MG tablet [id number] Yes TAKE 1 TABLET (25 MG TOTAL) BY MOUTH DAILY. Larey Dresser, MD Taking Active Self  ?FARXIGA 10 MG TABS tablet [id number] Yes TAKE 1 TABLET BY MOUTH EVERY DAY BEFORE BREAKFAST Larey Dresser, MD Taking Active   ?         ?Med Note Jonathon Jordan Aug 25, 2021 11:27 AM) [place] patient [place] program thru 07/16/2022  ?fenofibrate (TRICOR) 145 MG tablet [id number] Yes TAKE 1 TABLET BY MOUTH EVERY DAY Larey Dresser, MD Taking Active   ?furosemide (LASIX) 40 MG tablet [id number] Yes Take 2 tablets (80 mg total) by mouth 2 (two) times daily. Larey Dresser, MD Taking Active Self  ?HUMALOG MIX 75/25 KWIKPEN (75-25) 100 UNIT/ML KwikPen [id number] Yes Inject 30-40 Units into the skin See admin instructions. Take 40 units in the AM, then take 30 units in the afternoon per patient [provider] Taking Active Self  ?icosapent Ethyl (VASCEPA) 1 g capsule [id number] Yes TAKE 2 CAPSULES BY MOUTH TWICE A DAY Larey Dresser, MD Taking Active   ?KLOR-CON M10 10 MEQ tablet [id number] Yes TAKE 2 TABLETS BY MOUTH 2 TIMES DAILY. Larey Dresser, MD Taking Active   ?LIVALO 4 MG TABS [id number] Yes TAKE 1 TABLET BY MOUTH EVERY DAY Larey Dresser, MD Taking Active   ?metoprolol succinate (TOPROL XL) 25 MG 24 hr tablet [id number] Yes Take 1 tablet (25 mg total) by mouth daily. Larey Dresser, MD Taking Active    ?mexiletine (MEXITIL) 250 MG capsule [id number] Yes Take 1 capsule (250 mg total) by mouth 2 (two) times daily. Constance Haw, MD Taking Active   ?Multiple Vitamin (MULTIVITAMIN WITH MINERALS) TABS table

## 2021-11-07 NOTE — Patient Instructions (Signed)
Mrs. Harding,  ?It was a pleasure speaking with you  ?Below is a summary of your health goals and care plan ? ?Patient Goals/Self-Care Activities ?take medications as prescribed,  ?check blood pressure 3 to 4 times per week, document, and provide at future appointments,  ?weigh daily, and contact provider if weight gain of more than 3 lbs in 24 hours or 5 lbs in 1 week, and collaborate with provider on medication access solutions ?Make appointment with PCP and endocrinologist ?Recheck lipids / cholesterol with next labs ?Limit intake of sugar and carbohydrates.  ? ? ?If you have any questions or concerns, please feel free to contact me either at the phone number below or with a MyChart message.  ? ?Keep up the good work! ? ?[name], PharmD ?Clinical Pharmacist ?[facility] ?[place] ?[phone] (direct line)  ?[phone] (main office number) ? ? ?Patient verbalizes understanding of instructions and care plan provided today and agrees to view in [place]. Active MyChart status confirmed with patient.    ?

## 2021-11-13 DIAGNOSIS — E785 Hyperlipidemia, unspecified: Secondary | ICD-10-CM

## 2021-11-13 DIAGNOSIS — I10 Essential (primary) hypertension: Secondary | ICD-10-CM

## 2021-11-13 DIAGNOSIS — I480 Paroxysmal atrial fibrillation: Secondary | ICD-10-CM

## 2021-11-13 DIAGNOSIS — I5020 Unspecified systolic (congestive) heart failure: Secondary | ICD-10-CM

## 2021-11-13 DIAGNOSIS — E1165 Type 2 diabetes mellitus with hyperglycemia: Secondary | ICD-10-CM

## 2021-11-13 DIAGNOSIS — Z794 Long term (current) use of insulin: Secondary | ICD-10-CM

## 2021-11-22 NOTE — Telephone Encounter (Signed)
Patient doing well on Ozempic '2mg'$ . Fasting BG averages about 90, post prandial 120. No lows. Continue Ozempic '2mg'$  weekly. No changes in insulin needed. ?

## 2021-11-28 ENCOUNTER — Telehealth: Payer: Self-pay | Admitting: Cardiology

## 2021-11-28 ENCOUNTER — Ambulatory Visit (INDEPENDENT_AMBULATORY_CARE_PROVIDER_SITE_OTHER): Payer: Medicare Other

## 2021-11-28 DIAGNOSIS — I5020 Unspecified systolic (congestive) heart failure: Secondary | ICD-10-CM

## 2021-11-28 DIAGNOSIS — E1165 Type 2 diabetes mellitus with hyperglycemia: Secondary | ICD-10-CM

## 2021-11-28 NOTE — Telephone Encounter (Signed)
Pt called tonight to report an episode Sat with HR per sp02 probe of 170 she did not feel well but then she did improve.  She felt ok Sunday with HR < 100.  Tonight she checked her sp02 and HR was 170 - she is not aware of heart beating rapidly just that the finger device notes 170.  Then when talking to answering service she didn't hear the question and around the same time she dropped the pen.  She was sitting in chair, she is concerned that her device may have shocked her  she does not know how to send in a tracing. ? ?Sherri please check with pt tomorrow and have device clinic check device.  She has hx a fib and VT.  Thank you   ?

## 2021-11-28 NOTE — Patient Instructions (Addendum)
Visit Information ? ?Thank you for taking time to visit with me today. Please don't hesitate to contact me if I can be of assistance to you before our next scheduled telephone appointment. ? ?Following are the goals we discussed today:  ?Patient Goals/Self-Care Activities: ?Continue to take medications as prescribed. ?Per provider Limit fluid intake to less than 2 Liters/day ?Continue to attend all recommended/scheduled provider appointments ?Continue to call provider office for new concerns or questions ?Continue to be active and exercise per provider recommendation ?Continue to check your blood sugars as recommended by your provider, record and take to your provider visits. ?Continue to eat healthy: : eat plenty of vegetables, fruits, lean meats; low fat. Limit: sugars, rice, potatoes, pasta. Monitor salt intake ? ?Our next appointment is by telephone on 02/28/22 at 10:00 am ? ?Please call the care guide team at [phone] if you need to cancel or reschedule your appointment.  ? ?If you are experiencing a Mental Health or [place] or need someone to talk to, please call the Suicide and Crisis Lifeline: 988 ?call 1-800-273-TALK (toll free, 24 hour hotline)  ? ?Patient verbalizes understanding of instructions and care plan provided today and agrees to view in [place]. Active MyChart status confirmed with patient.   ? ?Thea Silversmith, RN, MSN, BSN, CCM ?Care Management Coordinator ?[place] ?[phone]  ?

## 2021-11-28 NOTE — Chronic Care Management (AMB) (Signed)
Chronic Care Management   CCM RN Visit Note  11/28/2021 Name: Heidi Stark MRN: [id number] DOB: 03-Jul-1941  Subjective: Heidi Stark is a 81 y.o. year old female who is a primary care patient of Heidi Held, DO. The care management team was consulted for assistance with disease management and care coordination needs.    Engaged with patient by telephone for follow up visit in response to provider referral for case management and/or care coordination services.   Consent to Services:  The patient was given information about Chronic Care Management services, agreed to services, and gave verbal consent prior to initiation of services.  Please see initial visit note for detailed documentation.   Patient agreed to services and verbal consent obtained.   Assessment: Review of patient past medical history, allergies, medications, health status, including review of consultants reports, laboratory and other test data, was performed as part of comprehensive evaluation and provision of chronic care management services.   SDOH (Social Determinants of Health) assessments and interventions performed:    CCM Care Plan  Allergies  Allergen Reactions   Neomycin-Bacitracin Zn-Polymyx Rash   Sulfonamide Derivatives Other (See Comments)    Stomach cramps   Ciprofloxacin Hives, Itching and Rash    Pt doesn't remember having any reaction to cipro   Spironolactone Rash    Outpatient Encounter Medications as of 11/28/2021  Medication Sig Note   acetaminophen (TYLENOL) 500 MG tablet Take 500 mg by mouth every 6 (six) hours as needed for moderate pain or headache.    albuterol (VENTOLIN HFA) 108 (90 Base) MCG/ACT inhaler Inhale 2 puffs into the lungs every 4 (four) hours as needed for wheezing or shortness of breath.    amiodarone (PACERONE) 200 MG tablet Take 1 tablet (200 mg total) by mouth daily.    apixaban (ELIQUIS) 5 MG TABS tablet TAKE 1 TABLET BY MOUTH TWICE A DAY    eplerenone  (INSPRA) 25 MG tablet TAKE 1 TABLET (25 MG TOTAL) BY MOUTH DAILY.    FARXIGA 10 MG TABS tablet TAKE 1 TABLET BY MOUTH EVERY DAY BEFORE BREAKFAST 08/25/2021: Patient approve [place] patient [place] program thru 07/16/2022   fenofibrate (TRICOR) 145 MG tablet TAKE 1 TABLET BY MOUTH EVERY DAY    furosemide (LASIX) 40 MG tablet Take 1 tablet (40 mg total) by mouth 2 (two) times daily.    HUMALOG MIX 75/25 KWIKPEN (75-25) 100 UNIT/ML KwikPen Inject 30-40 Units into the skin See admin instructions. Take 40 units in the AM, then take 30 units in the afternoon per patient 09/16/2021: Approved 08/30/2021 to receive from [employer] thru 07/16/2022   icosapent Ethyl (VASCEPA) 1 g capsule TAKE 2 CAPSULES BY MOUTH TWICE A DAY    KLOR-CON M10 10 MEQ tablet TAKE 2 TABLETS BY MOUTH 2 TIMES DAILY.    LIVALO 4 MG TABS TAKE 1 TABLET BY MOUTH EVERY DAY    metolazone (ZAROXOLYN) 2.5 MG tablet Take 1 tablet by mouth every other Friday    metoprolol succinate (TOPROL XL) 25 MG 24 hr tablet Take 1 tablet (25 mg total) by mouth daily.    mexiletine (MEXITIL) 250 MG capsule Take 1 capsule (250 mg total) by mouth 2 (two) times daily.    Multiple Vitamin (MULTIVITAMIN WITH MINERALS) TABS tablet Take 1 tablet by mouth daily.    rOPINIRole (REQUIP) 0.25 MG tablet TAKE 2 TABLETS BY MOUTH AT BEDTIME    sacubitril-valsartan (ENTRESTO) 49-51 MG Take 1 tablet by mouth 2 (two)  times daily. 09/14/2021: Approved to receive from medication assistance program thru 07/16/2022   Semaglutide, 2 MG/DOSE, (OZEMPIC, 2 MG/DOSE,) 8 MG/3ML SOPN Inject 2 mg into the skin once a week. 11/01/2021: Approved for patient assistance program 09/2021 thru 06/15/2022   BD PEN NEEDLE NANO 2ND GEN 32G X 4 MM MISC See admin instructions.    ONETOUCH VERIO test strip 1 each 3 (three) times daily.    No facility-administered encounter medications on file as of 11/28/2021.    Patient Active Problem List   Diagnosis Date Noted   Diabetic nephropathy ([place])  10/13/2021   Hyperglycemia due to type 2 diabetes mellitus ([place]) 10/13/2021   Proteinuria 10/13/2021   Pure hypercholesterolemia 10/13/2021   COVID-19 04/15/2021   Respiratory failure with hypoxia ([place]) 41/32/4401   Systolic CHF ([place]) 02/72/5366   CKD (chronic kidney disease) stage 3, GFR 30-59 ml/min (HCC) 04/15/2021   Elevated troponin 04/15/2021   Acute on chronic systolic (congestive) heart failure ([place]) 09/14/2020   Wide-complex tachycardia 09/07/2020   Ventricular tachyarrhythmia ([place]) 09/07/2020   VT (ventricular tachycardia) ([place]) 09/07/2020   Persistent atrial fibrillation ([place]) 04/30/2018   Wound of left leg 10/01/2017   Cellulitis of right lower extremity 11/27/2016   Encounter for assessment for deep vein thrombosis (DVT) 11/14/2016   Hypokalemia 08/13/2016   Type 2 diabetes mellitus with hyperglycemia, with long-term current use of insulin (HCC)    Atherosclerosis of aorta ([place]) 06/26/2016   Colitis 11/24/2014   Severe obesity (BMI >= 40) (HCC) 11/12/2013   PAF (paroxysmal atrial fibrillation) ([place]) 11/03/2013   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 10/28/2013   COLONIC POLYPS 08/16/2010   PAD (peripheral artery disease) ([place]) 08/15/2010   HLD (hyperlipidemia) 11/18/2009   MYALGIA 11/18/2009   OBESITY 05/14/2007   PULMONARY NODULE, RIGHT MIDDLE LOBE 05/14/2007   Obstructive sleep apnea 12/06/2006   Essential hypertension 12/06/2006   COPD (chronic obstructive pulmonary disease) ([place]) 12/06/2006    Conditions to be addressed/monitored:Atrial Fibrillation, CHF, HTN, COPD, and DMII  Care Plan : RN Care Manager Plan of Care  Updates made by Heidi Rued, RN since 11/28/2021 12:00 AM     Problem: No plan of Care Established for Management of Chronic Disease   Priority: High     Long-Range Goal: Development of Plan of Care for Chronic Disease Managment   Start Date: 05/12/2021  Expected End Date: 03/12/2022  Priority: High   Note:   Current Barriers:  09/26/21 Heidi Stark reports attending provider visits as scheduled. Denies any signs/symptoms of CHF exacerbation. She continues to check daily weights and monitors blood pressure routinely.  Last office visit with advanced Heart failure clinic on 09/16/21.She denies any signs/symptoms of atrial fibrillation exacerbation. However reports episodes of VT 08/2021 was started on Mexiletin by EP, Dr. [name]; also taking Amiodarone.  She continues device checks remotely. She denies any signs symptoms of COPD exacerbation. She reports blood sugars range 97-170. Denies any hypoglycemic or hyperglycemic episodes. Mrs. Payment is without questions or concerns at this time. She continues to take medications as prescribed.  11/28/21 Mrs. Kirchner states she is doing well. She reports she continues to weigh daily and denies signs/symptoms of HF exacerbation. Weight today 261lb. And denies any issues with COPD. She reports she has not had to use an inhaler since she was hospitalized 04/15/21 with Covid. Reports CBG 99 today. She denies any episodes of hypoglycemia. Mrs. Piatt reports plans to recheck A1C level at an upcoming visit (Endocrinologist  Dr. [name]).  Annual visit and/or AWV due- per chart completed 10/13/21 Chronic Disease Management support and education needs related to Atrial Fibrillation, CHF, COPD, DMII, and chronic low back pain   RNCM Clinical Goal(s):  Patient will verbalize understanding of plan for management of Atrial Fibrillation, CHF, COPD, DMII, and chronic low back pain as evidenced by self report and/or chart notation take all medications exactly as prescribed and will call provider for medication related questions as evidenced by self report and/or chart notation demonstrate ongoing self health care management ability   as evidenced by attending provider visits as scheduled, taking medications as prescribed, contacting provider with questions and concerns as needed   through collaboration with RN Care manager, provider, and care team.   Interventions: 1:1 collaboration with primary care provider regarding development and update of comprehensive plan of care as evidenced by provider attestation and co-signature Inter-disciplinary care team collaboration (see longitudinal plan of care) Evaluation of current treatment plan related to  self management and patient's adherence to plan as established by provider  Arrythmias: AFIB/VT Interventions: (Status:  Goal on track:  Yes.) Long Term Goal 11/28/21 BP 130/78 HR 77 O2 sat 94% Reviewed medications, encouraged to continue to take as prescribed Reviewed upcoming appointments with patient and encouraged to continue to attend as recommended/scheduled  Hypertension Interventions: (Status:  Goal on track:  Yes.) Long Term Goal 11/28/21 BP 130/78 HR 77 O2 sat 94% 09/26/21 Per patient-last checked 09/25/21 BP 120/70 BP Readings from Last 3 Encounters:  09/16/21 (!) 104/54  09/08/21 106/70  09/01/21 104/60  Most recent eGFR/CrCl:  Lab Results  Component Value Date   EGFR 29 (L) 09/08/2021    No components found for: CRCL Discussed BP readings with client Reviewed upcoming/scheduled provider appointments Encouraged to continue to take medications as prescribed Discussed diet and encouraged to continue to monitor salt intake Provided positive feedback regarding self health management  Diabetes Interventions: (Status:  Goal on track:  Yes.) Long Term Goal 11/28/21 Blood sugar 99. Denies any hypoglycemic episodes  Assessed patient's understanding of A1c goal: <8% Medications reviewed and encouraged to continue to take as prescribed Reviewed A1C goal; Fasting BS goal 80-130 and <180 after meals. Discussed diet intake. Encouraged to continue to eat healthy-low sodium, carb modified  Reviewed upcoming/scheduled appointments, encouraged to attend as recommended Lab Results  Component Value Date   HGBA1C 9.0 (H)  04/15/2021  COPD Interventions: (Status:  Goal Met) Long Term Goal Denies any issues with COPD  Medications reviewed with patient and encouraged to continue to take as prescribed. Encouraged to continue to attend provider visits as scheduled  Heart Failure Interventions: (Status: Goal on track: Yes). Long Term Goal Denies any signs or symptoms of exacerbation Reviewed signs/symptoms of HF exacerbation Reviewed daily weights and when to call the doctor Discussed low salt diet Upcoming scheduled appointments reviewed with patient and encouraged to continue to attend provider visits as scheduled Provided positive feedback regarding self health management  Patient Goals/Self-Care Activities: Continue to take medications as prescribed. Per provider Limit fluid intake to less than 2 Liters/day Continue to attend all recommended/scheduled provider appointments Continue to call provider office for new concerns or questions Continue to be active and exercise per provider recommendation Continue to check your blood sugars as recommended by your provider, record and take to your provider visits. Continue to eat healthy: : eat plenty of vegetables, fruits, lean meats; low fat. Limit: sugars, rice, potatoes, pasta. Monitor salt intake   Plan:Telephone follow up appointment  with care management team member scheduled for:  02/28/22 The patient has been provided with contact information for the care management team and has been advised to call with any health related questions or concerns.   Thea Silversmith, RN, MSN, BSN, CCM Care Management Coordinator [facility] [phone]

## 2021-11-29 NOTE — Telephone Encounter (Signed)
Alert: MDT alert for sustained VT with successful therapy ?There are 6 treated VT episodes, 5 of 6 pace terminated with 1-3 bursts of ATP ?Episode #314 occurred 5/15 @ 18:49, EGM shows sustained VT, mean HR 182, unsuccessful ATP x5 followed by 35J of HV therapy converting to regular VP ?There are 13 NSVT, and 13 monitored VT episodes, longest 56mn, HR's 132-182 ?Optivol crossed threshold 5/12 and is ongoing ?47 AF events, longest 2hrs 271m, controlled rates ?Burden 76.5%, Tikosyn, Eliquis ?RV output high, consistent with trend, known ? ?Spoke with patient stated she forgot to take all her medications on Friday night. Meds include Amiodarone, Toprol XL, Mexiletine.  ? ?Patient scheduled for appointment with WC on 12/08/21 patient voiced understanding of this apt patient also voiced understanding of driving restrictions x 6 months.  ? ? ? ? ? ?

## 2021-12-02 ENCOUNTER — Telehealth: Payer: Self-pay | Admitting: Pharmacist

## 2021-12-02 NOTE — Telephone Encounter (Signed)
Pt c/o medication issue:  1. Name of Medication:  Semaglutide, 2 MG/DOSE, (OZEMPIC, 2 MG/DOSE,) 8 MG/3ML SOPN  2. How are you currently taking this medication (dosage and times per day)?  As prescribed, 1 2ML injection weekly  3. Are you having a reaction (difficulty breathing--STAT)?  See below  4. What is your medication issue?   Patient is requesting to speak with PharmD regarding this medication. She states it is causing her to become very sick. She is unable to keep food down and vomiting. She would like to know if she can be put on an alternative./ please advise.

## 2021-12-02 NOTE — Telephone Encounter (Signed)
Spoke with patient. Her last dose was on Sunday 5/14. I advised her ok to delay next dose by a few days. Will decrease dose to '1mg'$ . Patient gets medication from pt assistance and has a few boxes of the '2mg'$ . I advised her to only click the pen half way to give ~'1mg'$ . Will send in '1mg'$  rx for her next refill. Pt to call if no improvement. She has been on '2mg'$  for 1 month. Was nauseous but now throwing up. Patient advised to eat small amount of food to help with nausea.

## 2021-12-05 ENCOUNTER — Telehealth: Payer: Self-pay | Admitting: *Deleted

## 2021-12-05 DIAGNOSIS — I472 Ventricular tachycardia, unspecified: Secondary | ICD-10-CM

## 2021-12-05 DIAGNOSIS — Z79899 Other long term (current) drug therapy: Secondary | ICD-10-CM

## 2021-12-05 NOTE — Telephone Encounter (Signed)
Pt aware she will need blood work prior to her follow up appt w/ [facility] on Thursday. Pt will stop by the office tomorrow for this blood work. States that if her dtr can't get her here tomorrow she will stop by on Wednesday and aware to let us know via mychart if she can't make tomorrows lab appt. Aware she does not need to be fasting. Patient verbalized understanding and agreeable to plan.

## 2021-12-06 ENCOUNTER — Other Ambulatory Visit: Payer: Medicare Other | Admitting: *Deleted

## 2021-12-06 DIAGNOSIS — Z79899 Other long term (current) drug therapy: Secondary | ICD-10-CM

## 2021-12-06 DIAGNOSIS — I472 Ventricular tachycardia, unspecified: Secondary | ICD-10-CM

## 2021-12-07 LAB — BASIC METABOLIC PANEL
BUN/Creatinine Ratio: 12 (ref 12–28)
BUN: 21 mg/dL (ref 8–27)
CO2: 25 mmol/L (ref 20–29)
Calcium: 8.1 mg/dL — ABNORMAL LOW (ref 8.7–10.3)
Chloride: 94 mmol/L — ABNORMAL LOW (ref 96–106)
Creatinine, Ser: 1.7 mg/dL — ABNORMAL HIGH (ref 0.57–1.00)
Glucose: 155 mg/dL — ABNORMAL HIGH (ref 70–99)
Potassium: 3.8 mmol/L (ref 3.5–5.2)
Sodium: 136 mmol/L (ref 134–144)
eGFR: 30 mL/min/{1.73_m2} — ABNORMAL LOW (ref >59)

## 2021-12-07 LAB — PRO B NATRIURETIC PEPTIDE: NT-Pro BNP: 542 pg/mL (ref 0–738)

## 2021-12-07 LAB — MAGNESIUM: Magnesium: 2.3 mg/dL (ref 1.6–2.3)

## 2021-12-08 ENCOUNTER — Ambulatory Visit: Payer: Medicare Other | Admitting: Cardiology

## 2021-12-08 ENCOUNTER — Encounter: Payer: Self-pay | Admitting: Cardiology

## 2021-12-08 VITALS — BP 102/62 | HR 61 | Ht 66.0 in | Wt 266.0 lb

## 2021-12-08 DIAGNOSIS — I472 Ventricular tachycardia, unspecified: Secondary | ICD-10-CM | POA: Diagnosis not present

## 2021-12-08 NOTE — Patient Instructions (Signed)
Medication Instructions:  Your physician recommends that you continue on your current medications as directed. Please refer to the Current Medication list given to you today.  *If you need a refill on your cardiac medications before your next appointment, please call your pharmacy*   Lab Work: Follow up with your primary doctor about your abnormal thyroid.  This has been abnormal for the last 6 months   Testing/Procedures: None ordered   Follow-Up: At [facility], you and your health needs are our priority.  As part of our continuing mission to provide you with exceptional heart care, we have created designated Provider Care Teams.  These Care Teams include your primary Cardiologist (physician) and Advanced Practice Providers (APPs -  Physician Assistants and Nurse Practitioners) who all work together to provide you with the care you need, when you need it.  Remote monitoring is used to monitor your Pacemaker or ICD from home. This monitoring reduces the number of office visits required to check your device to one time per year. It allows [name] to keep an eye on the functioning of your device to ensure it is working properly. You are scheduled for a device check from home on 12/15/21. You may send your transmission at any time that day. If you have a wireless device, the transmission will be sent automatically. After your physician reviews your transmission, you will receive a postcard with your next transmission date.  Your next appointment:   3 month(s)  The format for your next appointment:   In Person  Provider:   You will see one of the following Advanced Practice Providers on your designated Care Team:   [name], [place] Legrand Como "Jonni Sanger" [name], PA-C{  Thank you for choosing [facility]!!   Trinidad Curet, RN [phone]    Other Instructions  Need a primary care physician -- [phone]  Important Information About Sugar

## 2021-12-08 NOTE — Progress Notes (Signed)
Electrophysiology Office Note   Date:  12/08/2021   ID:  Heidi Stark, Heidi Stark 07/08/1941, MRN [id number]  PCP:  [name], [name], DO  Cardiologist:  [name] Primary Electrophysiologist:  Dr [name]  CC: Follow up for atrial fibrillation   History of Present Illness: Heidi Stark is a 81 y.o. female who is being seen today for the evaluation of atrial fibrillatoin at the request of [name], [name], *. Presenting today for electrophysiology evaluation.   She has a history of a diastolic heart failure, COPD, DVT, hypertension, hyperlipidemia, OSA on CPAP, atrial fibrillation, and diabetes.  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  She was started on Coumadin.  She was also noted to have a left leg pain and was found to have an occluded left internal iliac.  January 2018 she developed exertional dyspnea.  She was found to be in atrial fibrillation with heart failure.  She is status post ablation April 2018.  She was seen in clinic in 2019 and was found to be in atrial fibrillation and was cardioverted.  She is status post admission for dofetilide loading 04/30/2018.   Today, denies symptoms of palpitations, chest pain, shortness of breath, orthopnea, PND, lower extremity edema, claudication, dizziness, presyncope, syncope, bleeding, or neurologic sequela. The patient is tolerating medications without difficulties.  She presents today complaining of mild shortness of breath.  She is unclear as to how long her shortness of breath has been occurring.  She has no chest pain.  She does continue to smoke, up to a pack a day.  She was initially on Chantix, but this has been recalled.   Past Medical History:  Diagnosis Date   A-fib [facility])    Arthritis    "hands, wrists" (11/07/2016)   CHF (congestive heart failure) (HCC)    Chronic lower back pain    Constipation    COPD (chronic obstructive pulmonary disease) ([place])    DVT  (deep venous thrombosis) (HCC)    Dyspnea    History of blood transfusion 1990   "related to OR"   History of kidney stones    Hyperlipidemia    Hypertension    Joint pain    Lower extremity edema    OSA on CPAP    Persistent atrial fibrillation ([place])    Pneumonia    "several times" (11/07/2016)   Type II diabetes mellitus ([place])    Past Surgical History:  Procedure Laterality Date   ATRIAL FIBRILLATION ABLATION N/A 11/07/2016   Procedure: Atrial Fibrillation Ablation;  Surgeon: Elida Harbin [name], MD;  Location: [place] [facility];  Service: Cardiovascular;  Laterality: N/A;   BACK SURGERY     CARDIAC CATHETERIZATION     CARDIOVERSION N/A 08/11/2016   Procedure: CARDIOVERSION;  Surgeon: Larey Dresser, MD;  Location: [place];  Service: Cardiovascular;  Laterality: N/A;   CARDIOVERSION N/A 12/03/2017   Procedure: CARDIOVERSION;  Surgeon: Larey Dresser, MD;  Location: [facility] ENDOSCOPY;  Service: Cardiovascular;  Laterality: N/A;   CARDIOVERSION N/A 05/02/2018   Procedure: CARDIOVERSION;  Surgeon: Pixie Casino, MD;  Location: [facility] ENDOSCOPY;  Service: Cardiovascular;  Laterality: N/A;   CARDIOVERSION N/A 06/23/2020   Procedure: CARDIOVERSION;  Surgeon: [name], MD;  Location: [facility] ENDOSCOPY;  Service: Cardiovascular;  Laterality: N/A;   CARDIOVERSION N/A 10/11/2020   Procedure: CARDIOVERSION;  Surgeon: Larey Dresser, MD;  Location: [facility] ENDOSCOPY;  Service: Cardiovascular;  Laterality: N/A;  CARPAL TUNNEL RELEASE     CATARACT EXTRACTION W/ INTRAOCULAR LENS  IMPLANT, BILATERAL Bilateral    COLON SURGERY  1990   vein graft and colon repair after nicked artery with back surgert   COLONOSCOPY     CORONARY ANGIOGRAPHY N/A 09/08/2020   Procedure: CORONARY ANGIOGRAPHY;  Surgeon: Larey Dresser, MD;  Location: [place] [facility];  Service: Cardiovascular;  Laterality: N/A;   CYSTECTOMY     between bladder and kidneys   GANGLION CYST EXCISION Left    ICD IMPLANT N/A  09/15/2020   Procedure: ICD IMPLANT;  Surgeon: Constance Haw, MD;  Location: [place] [facility];  Service: Cardiovascular;  Laterality: N/A;   KNEE CARTILAGE SURGERY Right 1960s   LUMBAR [place] ILIAC ARTERY  1990   vein graft and colon repair after nicked artery with back surgert   TEE WITHOUT CARDIOVERSION N/A 10/31/2013   Procedure: TRANSESOPHAGEAL ECHOCARDIOGRAM (TEE);  Surgeon: Larey Dresser, MD;  Location: [facility] ENDOSCOPY;  Service: Cardiovascular;  Laterality: N/A;   TUBAL LIGATION       Current Outpatient Medications  Medication Sig Dispense Refill   acetaminophen (TYLENOL) 500 MG tablet Take 500 mg by mouth every 6 (six) hours as needed for moderate pain or headache.     albuterol (VENTOLIN HFA) 108 (90 Base) MCG/ACT inhaler Inhale 2 puffs into the lungs every 4 (four) hours as needed for wheezing or shortness of breath. 8 g 0   amiodarone (PACERONE) 200 MG tablet Take 1 tablet (200 mg total) by mouth daily. 90 tablet 3   apixaban (ELIQUIS) 5 MG TABS tablet TAKE 1 TABLET BY MOUTH TWICE A DAY 60 tablet 3   BD PEN NEEDLE NANO 2ND GEN 32G X 4 MM MISC See admin instructions.     eplerenone (INSPRA) 25 MG tablet TAKE 1 TABLET (25 MG TOTAL) BY MOUTH DAILY. 90 tablet 1   FARXIGA 10 MG TABS tablet TAKE 1 TABLET BY MOUTH EVERY DAY BEFORE BREAKFAST 30 tablet 6   fenofibrate (TRICOR) 145 MG tablet TAKE 1 TABLET BY MOUTH EVERY DAY 90 tablet 3   furosemide (LASIX) 40 MG tablet Take 1 tablet (40 mg total) by mouth 2 (two) times daily. 180 tablet 3   HUMALOG MIX 75/25 KWIKPEN (75-25) 100 UNIT/ML KwikPen Inject 30-40 Units into the skin See admin instructions. Take 40 units in the AM, then take 30 units in the afternoon per patient     icosapent Ethyl (VASCEPA) 1 g capsule TAKE 2 CAPSULES BY MOUTH TWICE A DAY 120 capsule 5   KLOR-CON M10 10 MEQ tablet TAKE 2 TABLETS BY MOUTH 2 TIMES DAILY. 360 tablet 3   LIVALO 4 MG TABS TAKE 1 TABLET BY MOUTH EVERY DAY 90 tablet 3    metolazone (ZAROXOLYN) 2.5 MG tablet Take 1 tablet by mouth every other Friday 2 tablet 5   metoprolol succinate (TOPROL XL) 25 MG 24 hr tablet Take 1 tablet (25 mg total) by mouth daily. 30 tablet 6   mexiletine (MEXITIL) 250 MG capsule Take 1 capsule (250 mg total) by mouth 2 (two) times daily. 60 capsule 3   Multiple Vitamin (MULTIVITAMIN WITH MINERALS) TABS tablet Take 1 tablet by mouth daily.     ONETOUCH VERIO test strip 1 each 3 (three) times daily.     rOPINIRole (REQUIP) 0.25 MG tablet TAKE 2 TABLETS BY MOUTH AT BEDTIME 180 tablet 0   sacubitril-valsartan (ENTRESTO) 49-51 MG Take 1 tablet by mouth  2 (two) times daily. 60 tablet 3   Semaglutide, 2 MG/DOSE, (OZEMPIC, 2 MG/DOSE,) 8 MG/3ML SOPN Inject 2 mg into the skin once a week. 3 mL 11   No current facility-administered medications for this visit.    Allergies:   Neomycin-bacitracin zn-polymyx, Sulfonamide derivatives, Ciprofloxacin, and Spironolactone   Social History:  The patient  reports that she has been smoking cigarettes. She has a 50.00 pack-year smoking history. She has never used smokeless tobacco. She reports that she does not drink alcohol and does not use drugs.   Family History:  The patient's family history includes Cancer in her mother; Diabetes in her father, mother, and another family member; Factor V Leiden deficiency in her daughter; Hypertension in her mother; Melanoma in an other family member; Obesity in her father and mother.   ROS:  Please see the history of present illness.   Otherwise, review of systems is positive for none.   All other systems are reviewed and negative.   PHYSICAL EXAM: VS:  BP 102/62   Pulse 61   Ht '5\' 6"'$  (1.676 m)   Wt 266 lb (120.7 kg)   SpO2 94%   BMI 42.93 kg/m  , BMI Body mass index is 42.93 kg/m. GEN: Well nourished, well developed, in no acute distress  HEENT: normal  Neck: no JVD, carotid bruits, or masses Cardiac: RRR; no murmurs, rubs, or gallops,no edema  Respiratory:   clear to auscultation bilaterally, normal work of breathing GI: soft, nontender, nondistended, + BS MS: no deformity or atrophy  Skin: warm and dry Neuro:  Strength and sensation are intact Psych: euthymic mood, full affect  EKG:  EKG is ordered today. Personal review of the ekg ordered shows atrial flutter, rate 122  Recent Labs: 04/23/2021: Hemoglobin 14.1; Platelets 240 06/24/2021: ALT 20 09/16/2021: B Natriuretic Peptide 73.9; TSH 8.131 12/06/2021: BUN 21; Creatinine, Ser 1.70; Magnesium 2.3; NT-Pro BNP 542; Potassium 3.8; Sodium 136    Lipid Panel     Component Value Date/Time   CHOL 186 01/27/2021 1146   CHOL 170 06/02/2019 0913   TRIG 292 (H) 01/27/2021 1146   HDL 34 (L) 01/27/2021 1146   HDL 31 (L) 06/02/2019 0913   CHOLHDL 5.5 01/27/2021 1146   VLDL 58 (H) 01/27/2021 1146   LDLCALC 94 01/27/2021 1146   LDLCALC 92 06/02/2019 0913   LDLCALC 77 06/28/2018 1410   LDLDIRECT 42.0 11/30/2014 1226     Wt Readings from Last 3 Encounters:  12/08/21 266 lb (120.7 kg)  10/13/21 269 lb (122 kg)  09/16/21 272 lb 12.8 oz (123.7 kg)      Other studies Reviewed: Additional studies/ records that were reviewed today include: TTE 12/21/17  Review of the above records today demonstrates:  - Left ventricle: The cavity size was normal. Wall thickness was   increased in a pattern of mild LVH. The estimated ejection   fraction was 55%. Although no diagnostic regional wall motion   abnormality was identified, this possibility cannot be completely   excluded on the basis of this study. Features are consistent with   a pseudonormal left ventricular filling pattern, with concomitant   abnormal relaxation and increased filling pressure (grade 2   diastolic dysfunction). - Aortic valve: There was no stenosis. - Mitral valve: Moderately fibrotic annulus. There was no   significant regurgitation. - Left atrium: The atrium was moderately dilated. - Right ventricle: The cavity size was  normal. Systolic function   was normal. - Right atrium: The atrium  was mildly dilated. - Tricuspid valve: Peak RV-RA gradient (S): 16 mm Hg. - Pulmonary arteries: PA peak pressure: 19 mm Hg (S). - Inferior vena cava: The vessel was normal in size. The   respirophasic diameter changes were in the normal range (>= 50%),   consistent with normal central venous pressure.   ASSESSMENT AND PLAN:  1.  Persistent atrial fibrillation/atrial flutter: Currently on Eliquis and dofetilide, monitoring for high risk medication.  CHA2DS2-VASc of 6.  Unfortunately she is back in atrial flutter today.  She does have symptoms of shortness of breath.  We Hosie Sharman plan for cardioversion.  2.  Chronic systolic heart failure: Ejection fraction has improved.  Currently on Toprol-XL, eplerenone, Lasix, Entresto.    3.  Coronary artery disease: Calcium score in the 82nd percentile.  Continue Crestor.  4.  COPD: Advised to quit smoking  5.  Obstructive sleep apnea: CPAP compliance encouraged   Current medicines are reviewed at length with the patient today.   The patient does not have concerns regarding her medicines.  The following changes were made today: none  Labs/ tests ordered today include:  Orders Placed This Encounter  Procedures   EKG 12-Lead     Disposition:   FU with Merna Baldi 3 months  Signed, Daneshia Tavano [name], MD  12/08/2021 12:45 PM     [place] [address] [phone] (office) [phone] (fax)    Electrophysiology Office Note   Date:  12/08/2021   ID:  Heidi Stark, Heidi Stark [birth year], MRN [id number]  PCP:  Ann Held, DO  Cardiologist:  [name] Primary Electrophysiologist:  Dr [name]  CC: Follow up for atrial fibrillation   History of Present Illness: Heidi Stark is a 81 y.o. female who is being seen today for the evaluation of atrial fibrillatoin at the request of [name], [name], *. Presenting  today for electrophysiology evaluation.    She has a history significant for diastolic heart failure, COPD, DVT, hypertension, hyperlipidemia, OSA on CPAP, atrial fibrillation, diabetes.  She wears oxygen at night.  She is status post atrial fibrillation ablation in 2018.  She is hospitalized 09/12/2020 with 3 episodes of presyncope.  She was started on amiodarone for ventricular tachycardia and is status post Medtronic ICD.  On 09/06/2021 she received multiple rounds of ATP for ventricular tachycardia.  She did not receive ICD shocks.  She felt poorly that day.  OptiVol was elevated.  She was started on mexiletine.  On 11/28/2021, she had more episodes of VT.  She had an episode of syncope and received an ICD shock.  She was unaware of the shock.  She states that she was noncompliant with her mexiletine around the time of her ICD shock.  Today, denies symptoms of palpitations, chest pain, shortness of breath, orthopnea, PND, lower extremity edema, claudication, dizziness, presyncope, syncope, bleeding, or neurologic sequela. The patient is tolerating medications without difficulties.     Past Medical History:  Diagnosis Date   A-fib [facility])    Arthritis    "hands, wrists" (11/07/2016)   CHF (congestive heart failure) (HCC)    Chronic lower back pain    Constipation    COPD (chronic obstructive pulmonary disease) ([place])    DVT (deep venous thrombosis) ([place])    Dyspnea    History of blood transfusion 1990   "related to OR"   History of kidney stones    Hyperlipidemia    Hypertension    Joint  pain    Lower extremity edema    OSA on CPAP    Persistent atrial fibrillation (HCC)    Pneumonia    "several times" (11/07/2016)   Type II diabetes mellitus ([place])    Past Surgical History:  Procedure Laterality Date   ATRIAL FIBRILLATION ABLATION N/A 11/07/2016   Procedure: Atrial Fibrillation Ablation;  Surgeon: Cordarrell Sane [name], MD;  Location: [place] [facility];  Service: Cardiovascular;   Laterality: N/A;   BACK SURGERY     CARDIAC CATHETERIZATION     CARDIOVERSION N/A 08/11/2016   Procedure: CARDIOVERSION;  Surgeon: Larey Dresser, MD;  Location: [place];  Service: Cardiovascular;  Laterality: N/A;   CARDIOVERSION N/A 12/03/2017   Procedure: CARDIOVERSION;  Surgeon: Larey Dresser, MD;  Location: [facility] ENDOSCOPY;  Service: Cardiovascular;  Laterality: N/A;   CARDIOVERSION N/A 05/02/2018   Procedure: CARDIOVERSION;  Surgeon: Pixie Casino, MD;  Location: [facility] ENDOSCOPY;  Service: Cardiovascular;  Laterality: N/A;   CARDIOVERSION N/A 06/23/2020   Procedure: CARDIOVERSION;  Surgeon: [name], MD;  Location: [facility] ENDOSCOPY;  Service: Cardiovascular;  Laterality: N/A;   CARDIOVERSION N/A 10/11/2020   Procedure: CARDIOVERSION;  Surgeon: Larey Dresser, MD;  Location: [place];  Service: Cardiovascular;  Laterality: N/A;   CARPAL TUNNEL RELEASE     CATARACT EXTRACTION W/ INTRAOCULAR LENS  IMPLANT, BILATERAL Bilateral    COLON SURGERY  1990   vein graft and colon repair after nicked artery with back surgert   COLONOSCOPY     CORONARY ANGIOGRAPHY N/A 09/08/2020   Procedure: CORONARY ANGIOGRAPHY;  Surgeon: Larey Dresser, MD;  Location: [place] [facility];  Service: Cardiovascular;  Laterality: N/A;   CYSTECTOMY     between bladder and kidneys   GANGLION CYST EXCISION Left    ICD IMPLANT N/A 09/15/2020   Procedure: ICD IMPLANT;  Surgeon: Constance Haw, MD;  Location: [place] [facility];  Service: Cardiovascular;  Laterality: N/A;   KNEE CARTILAGE SURGERY Right 1960s   LUMBAR [place] ILIAC ARTERY  1990   vein graft and colon repair after nicked artery with back surgert   TEE WITHOUT CARDIOVERSION N/A 10/31/2013   Procedure: TRANSESOPHAGEAL ECHOCARDIOGRAM (TEE);  Surgeon: Larey Dresser, MD;  Location: [facility] ENDOSCOPY;  Service: Cardiovascular;  Laterality: N/A;   TUBAL LIGATION       Current Outpatient Medications  Medication Sig  Dispense Refill   acetaminophen (TYLENOL) 500 MG tablet Take 500 mg by mouth every 6 (six) hours as needed for moderate pain or headache.     albuterol (VENTOLIN HFA) 108 (90 Base) MCG/ACT inhaler Inhale 2 puffs into the lungs every 4 (four) hours as needed for wheezing or shortness of breath. 8 g 0   amiodarone (PACERONE) 200 MG tablet Take 1 tablet (200 mg total) by mouth daily. 90 tablet 3   apixaban (ELIQUIS) 5 MG TABS tablet TAKE 1 TABLET BY MOUTH TWICE A DAY 60 tablet 3   BD PEN NEEDLE NANO 2ND GEN 32G X 4 MM MISC See admin instructions.     eplerenone (INSPRA) 25 MG tablet TAKE 1 TABLET (25 MG TOTAL) BY MOUTH DAILY. 90 tablet 1   FARXIGA 10 MG TABS tablet TAKE 1 TABLET BY MOUTH EVERY DAY BEFORE BREAKFAST 30 tablet 6   fenofibrate (TRICOR) 145 MG tablet TAKE 1 TABLET BY MOUTH EVERY DAY 90 tablet 3   furosemide (LASIX) 40 MG tablet Take 1 tablet (40 mg total) by mouth 2 (  two) times daily. 180 tablet 3   HUMALOG MIX 75/25 KWIKPEN (75-25) 100 UNIT/ML KwikPen Inject 30-40 Units into the skin See admin instructions. Take 40 units in the AM, then take 30 units in the afternoon per patient     icosapent Ethyl (VASCEPA) 1 g capsule TAKE 2 CAPSULES BY MOUTH TWICE A DAY 120 capsule 5   KLOR-CON M10 10 MEQ tablet TAKE 2 TABLETS BY MOUTH 2 TIMES DAILY. 360 tablet 3   LIVALO 4 MG TABS TAKE 1 TABLET BY MOUTH EVERY DAY 90 tablet 3   metolazone (ZAROXOLYN) 2.5 MG tablet Take 1 tablet by mouth every other Friday 2 tablet 5   metoprolol succinate (TOPROL XL) 25 MG 24 hr tablet Take 1 tablet (25 mg total) by mouth daily. 30 tablet 6   mexiletine (MEXITIL) 250 MG capsule Take 1 capsule (250 mg total) by mouth 2 (two) times daily. 60 capsule 3   Multiple Vitamin (MULTIVITAMIN WITH MINERALS) TABS tablet Take 1 tablet by mouth daily.     ONETOUCH VERIO test strip 1 each 3 (three) times daily.     rOPINIRole (REQUIP) 0.25 MG tablet TAKE 2 TABLETS BY MOUTH AT BEDTIME 180 tablet 0   sacubitril-valsartan (ENTRESTO)  49-51 MG Take 1 tablet by mouth 2 (two) times daily. 60 tablet 3   Semaglutide, 2 MG/DOSE, (OZEMPIC, 2 MG/DOSE,) 8 MG/3ML SOPN Inject 2 mg into the skin once a week. 3 mL 11   No current facility-administered medications for this visit.    Allergies:   Neomycin-bacitracin zn-polymyx, Sulfonamide derivatives, Ciprofloxacin, and Spironolactone   Social History:  The patient  reports that she has been smoking cigarettes. She has a 50.00 pack-year smoking history. She has never used smokeless tobacco. She reports that she does not drink alcohol and does not use drugs.   Family History:  The patient's family history includes Cancer in her mother; Diabetes in her father, mother, and another family member; Factor V Leiden deficiency in her daughter; Hypertension in her mother; Melanoma in an other family member; Obesity in her father and mother.   ROS:  Please see the history of present illness.   Otherwise, review of systems is positive for none.   All other systems are reviewed and negative.   PHYSICAL EXAM: VS:  BP 102/62   Pulse 61   Ht '5\' 6"'$  (1.676 m)   Wt 266 lb (120.7 kg)   SpO2 94%   BMI 42.93 kg/m  , BMI Body mass index is 42.93 kg/m. GEN: Well nourished, well developed, in no acute distress  HEENT: normal  Neck: no JVD, carotid bruits, or masses Cardiac: RRR; no murmurs, rubs, or gallops,no edema  Respiratory:  clear to auscultation bilaterally, normal work of breathing GI: soft, nontender, nondistended, + BS MS: no deformity or atrophy  Skin: warm and dry, device site well healed Neuro:  Strength and sensation are intact Psych: euthymic mood, full affect  EKG:  EKG is ordered today. Personal review of the ekg ordered shows sinus rhythm, ventricular paced, rate 59  Personal review of the device interrogation today. Results in [place]: 04/23/2021: Hemoglobin 14.1; Platelets 240 06/24/2021: ALT 20 09/16/2021: B Natriuretic Peptide 73.9; TSH 8.131 12/06/2021: BUN  21; Creatinine, Ser 1.70; Magnesium 2.3; NT-Pro BNP 542; Potassium 3.8; Sodium 136    Lipid Panel     Component Value Date/Time   CHOL 186 01/27/2021 1146   CHOL 170 06/02/2019 0913   TRIG 292 (H) 01/27/2021 1146  HDL 34 (L) 01/27/2021 1146   HDL 31 (L) 06/02/2019 0913   CHOLHDL 5.5 01/27/2021 1146   VLDL 58 (H) 01/27/2021 1146   LDLCALC 94 01/27/2021 1146   LDLCALC 92 06/02/2019 0913   LDLCALC 77 06/28/2018 1410   LDLDIRECT 42.0 11/30/2014 1226     Wt Readings from Last 3 Encounters:  12/08/21 266 lb (120.7 kg)  10/13/21 269 lb (122 kg)  09/16/21 272 lb 12.8 oz (123.7 kg)      Other studies Reviewed: Additional studies/ records that were reviewed today include: TTE 12/21/17  Review of the above records today demonstrates:  - Left ventricle: The cavity size was normal. Wall thickness was   increased in a pattern of mild LVH. The estimated ejection   fraction was 55%. Although no diagnostic regional wall motion   abnormality was identified, this possibility cannot be completely   excluded on the basis of this study. Features are consistent with   a pseudonormal left ventricular filling pattern, with concomitant   abnormal relaxation and increased filling pressure (grade 2   diastolic dysfunction). - Aortic valve: There was no stenosis. - Mitral valve: Moderately fibrotic annulus. There was no   significant regurgitation. - Left atrium: The atrium was moderately dilated. - Right ventricle: The cavity size was normal. Systolic function   was normal. - Right atrium: The atrium was mildly dilated. - Tricuspid valve: Peak RV-RA gradient (S): 16 mm Hg. - Pulmonary arteries: PA peak pressure: 19 mm Hg (S). - Inferior vena cava: The vessel was normal in size. The   respirophasic diameter changes were in the normal range (>= 50%),   consistent with normal central venous pressure.   ASSESSMENT AND PLAN:  1.  Paroxysmal atrial fibrillation: Currently on Eliquis and  amiodarone.  CHA2DS2-VASc of 6.  High risk medication monitoring.  Has had minimal atrial fibrillation on amiodarone.  Continue with current management.  2.  Chronic systolic heart failure: Ejection fraction on most recent echo was improved to 50 to 55%.  Currently on Toprol-XL, eplerenone, Lasix, Entresto.  Status post Medtronic ICD implanted 09/15/2020.  Lites functioning appropriately.  No changes at this time.  3.  Coronary artery disease: Calcium score in the 82nd percentile.  Continue Crestor.  4.  COPD: Plan per pulmonary medicine.  5.  Obstructive sleep apnea: CPAP compliance encouraged  6.  VT/VF: Status post Medtronic ICD.  Currently on amiodarone 200 mg daily. Has been started on mexiletine 250 mg twice daily.  High risk medication monitoring for both amiodarone and mexiletine.  She did have another episode of ventricular tachycardia.  She states that she did not take her mexiletine around the time of her episode.  Due to that, we Alegra Rost continue to monitor.  She has been compliant with her medications.  If she does have more VT, would increase mexiletine to 300 mg twice daily.  Her TSH is elevated.  We Ayrianna Mcginniss have her follow-up with her primary physician.  Burden of ventricular tachycardia, she does need amiodarone.  She Marykathryn Carboni need supplementation of her thyroid per primary physician.  Current medicines are reviewed at length with the patient today.   The patient does not have concerns regarding her medicines.  The following changes were made today: None  Labs/ tests ordered today include:  Orders Placed This Encounter  Procedures   EKG 12-Lead     Disposition:   FU 3 months  Signed, Danylah Holden [name], MD  12/08/2021 12:45 PM     [facility]  [name] [place] [address] [address] [phone] (office) [phone] (fax)

## 2021-12-14 ENCOUNTER — Other Ambulatory Visit: Payer: Self-pay | Admitting: Cardiology

## 2021-12-14 DIAGNOSIS — I509 Heart failure, unspecified: Secondary | ICD-10-CM | POA: Diagnosis not present

## 2021-12-14 DIAGNOSIS — E1159 Type 2 diabetes mellitus with other circulatory complications: Secondary | ICD-10-CM | POA: Diagnosis not present

## 2021-12-14 DIAGNOSIS — J449 Chronic obstructive pulmonary disease, unspecified: Secondary | ICD-10-CM

## 2021-12-14 DIAGNOSIS — I472 Ventricular tachycardia, unspecified: Secondary | ICD-10-CM

## 2021-12-14 DIAGNOSIS — I110 Hypertensive heart disease with heart failure: Secondary | ICD-10-CM

## 2021-12-14 DIAGNOSIS — Z87891 Personal history of nicotine dependence: Secondary | ICD-10-CM

## 2021-12-14 DIAGNOSIS — Z794 Long term (current) use of insulin: Secondary | ICD-10-CM

## 2021-12-14 DIAGNOSIS — I499 Cardiac arrhythmia, unspecified: Secondary | ICD-10-CM

## 2021-12-15 ENCOUNTER — Encounter: Payer: Medicare Other | Admitting: Cardiology

## 2021-12-15 ENCOUNTER — Ambulatory Visit (INDEPENDENT_AMBULATORY_CARE_PROVIDER_SITE_OTHER): Payer: Medicare Other

## 2021-12-15 DIAGNOSIS — Z95810 Presence of automatic (implantable) cardiac defibrillator: Secondary | ICD-10-CM | POA: Diagnosis not present

## 2021-12-16 ENCOUNTER — Telehealth: Payer: Self-pay | Admitting: Pharmacist

## 2021-12-16 NOTE — Telephone Encounter (Signed)
Pt has been called and notified that we received a shipment of Ozempic '1mg'$  for her through patient assistance. I have updated her med list to reflect current '1mg'$  dose she's taking as she did not tolerate the higher '2mg'$  dose.  She plans to stop by on Monday to pick up med.

## 2021-12-17 LAB — CUP PACEART REMOTE DEVICE CHECK
Battery Remaining Longevity: 127 mo
Battery Voltage: 3.03 V
Brady Statistic RV Percent Paced: 0.09 %
Date Time Interrogation Session: 20230601033425
HighPow Impedance: 95 Ohm
Implantable Lead Implant Date: 20220302
Implantable Lead Location: 753860
Implantable Pulse Generator Implant Date: 20220302
Lead Channel Impedance Value: 646 Ohm
Lead Channel Impedance Value: 722 Ohm
Lead Channel Pacing Threshold Amplitude: 2.5 V
Lead Channel Pacing Threshold Pulse Width: 0.4 ms
Lead Channel Sensing Intrinsic Amplitude: 9.25 mV
Lead Channel Sensing Intrinsic Amplitude: 9.25 mV
Lead Channel Setting Pacing Amplitude: 5 V
Lead Channel Setting Pacing Pulse Width: 1 ms
Lead Channel Setting Sensing Sensitivity: 0.3 mV

## 2021-12-20 ENCOUNTER — Encounter (HOSPITAL_COMMUNITY): Payer: Self-pay | Admitting: Cardiology

## 2021-12-20 ENCOUNTER — Telehealth (HOSPITAL_COMMUNITY): Payer: Self-pay

## 2021-12-20 ENCOUNTER — Ambulatory Visit (HOSPITAL_COMMUNITY)
Admission: RE | Admit: 2021-12-20 | Discharge: 2021-12-20 | Disposition: A | Discharge status: Home / Self Care | Payer: Medicare Other | Source: Ambulatory Visit | Attending: Cardiology | Admitting: Cardiology

## 2021-12-20 VITALS — BP 90/50 | HR 64 | Wt 264.6 lb

## 2021-12-20 DIAGNOSIS — G4733 Obstructive sleep apnea (adult) (pediatric): Secondary | ICD-10-CM | POA: Insufficient documentation

## 2021-12-20 DIAGNOSIS — I5022 Chronic systolic (congestive) heart failure: Secondary | ICD-10-CM | POA: Insufficient documentation

## 2021-12-20 DIAGNOSIS — I480 Paroxysmal atrial fibrillation: Secondary | ICD-10-CM | POA: Insufficient documentation

## 2021-12-20 DIAGNOSIS — I472 Ventricular tachycardia, unspecified: Secondary | ICD-10-CM

## 2021-12-20 DIAGNOSIS — Z87891 Personal history of nicotine dependence: Secondary | ICD-10-CM | POA: Diagnosis not present

## 2021-12-20 DIAGNOSIS — I739 Peripheral vascular disease, unspecified: Secondary | ICD-10-CM | POA: Insufficient documentation

## 2021-12-20 DIAGNOSIS — I110 Hypertensive heart disease with heart failure: Secondary | ICD-10-CM | POA: Diagnosis not present

## 2021-12-20 DIAGNOSIS — E669 Obesity, unspecified: Secondary | ICD-10-CM | POA: Insufficient documentation

## 2021-12-20 DIAGNOSIS — Z6841 Body Mass Index (BMI) 40.0 and over, adult: Secondary | ICD-10-CM | POA: Insufficient documentation

## 2021-12-20 DIAGNOSIS — E785 Hyperlipidemia, unspecified: Secondary | ICD-10-CM | POA: Diagnosis not present

## 2021-12-20 DIAGNOSIS — Z7901 Long term (current) use of anticoagulants: Secondary | ICD-10-CM | POA: Diagnosis not present

## 2021-12-20 DIAGNOSIS — I4892 Unspecified atrial flutter: Secondary | ICD-10-CM | POA: Diagnosis not present

## 2021-12-20 DIAGNOSIS — E119 Type 2 diabetes mellitus without complications: Secondary | ICD-10-CM | POA: Diagnosis not present

## 2021-12-20 DIAGNOSIS — I2510 Atherosclerotic heart disease of native coronary artery without angina pectoris: Secondary | ICD-10-CM | POA: Insufficient documentation

## 2021-12-20 DIAGNOSIS — J449 Chronic obstructive pulmonary disease, unspecified: Secondary | ICD-10-CM | POA: Insufficient documentation

## 2021-12-20 LAB — COMPREHENSIVE METABOLIC PANEL
ALT: 30 U/L (ref 0–44)
AST: 65 U/L — ABNORMAL HIGH (ref 15–41)
Albumin: 3.5 g/dL (ref 3.5–5.0)
Alkaline Phosphatase: 38 U/L (ref 38–126)
Anion gap: 14 (ref 5–15)
BUN: 44 mg/dL — ABNORMAL HIGH (ref 8–23)
CO2: 28 mmol/L (ref 22–32)
Calcium: 8.8 mg/dL — ABNORMAL LOW (ref 8.9–10.3)
Chloride: 93 mmol/L — ABNORMAL LOW (ref 98–111)
Creatinine, Ser: 2.22 mg/dL — ABNORMAL HIGH (ref 0.44–1.00)
GFR, Estimated: 22 mL/min — ABNORMAL LOW (ref >60)
Glucose, Bld: 83 mg/dL (ref 70–99)
Potassium: 4.1 mmol/L (ref 3.5–5.1)
Sodium: 135 mmol/L (ref 135–145)
Total Bilirubin: 1.5 mg/dL — ABNORMAL HIGH (ref 0.3–1.2)
Total Protein: 7.5 g/dL (ref 6.5–8.1)

## 2021-12-20 LAB — TSH: TSH: 7.892 u[IU]/mL — ABNORMAL HIGH (ref 0.350–4.500)

## 2021-12-20 LAB — T4, FREE: Free T4: 1.59 ng/dL — ABNORMAL HIGH (ref 0.61–1.12)

## 2021-12-20 MED ORDER — FUROSEMIDE 40 MG PO TABS: ORAL_TABLET | ORAL | 3 refills | Status: DC

## 2021-12-20 NOTE — Patient Instructions (Signed)
There has been no changes to your medications.  Labs done today, your results will be available in MyChart, we will contact you for abnormal readings.  Your physician has requested that you have an echocardiogram. Echocardiography is a painless test that uses sound waves to create images of your heart. It provides your doctor with information about the size and shape of your heart and how well your heart's chambers and valves are working. This procedure takes approximately one hour. There are no restrictions for this procedure.  Your physician recommends that you schedule a follow-up appointment in: 2-3 months   If you have any questions or concerns before your next appointment please send [name] a message through [place] or call our office at [phone].    TO LEAVE A MESSAGE FOR THE NURSE SELECT OPTION 2, PLEASE LEAVE A MESSAGE INCLUDING: YOUR NAME DATE OF BIRTH CALL BACK NUMBER REASON FOR CALL**this is important as we prioritize the call backs  YOU WILL RECEIVE A CALL BACK THE SAME DAY AS LONG AS YOU CALL BEFORE 4:00 PM  At the [facility], you and your health needs are our priority. As part of our continuing mission to provide you with exceptional heart care, we have created designated Provider Care Teams. These Care Teams include your primary Cardiologist (physician) and Advanced Practice Providers (APPs- Physician Assistants and Nurse Practitioners) who all work together to provide you with the care you need, when you need it.   You may see any of the following providers on your designated Care Team at your next follow up: Dr Glori Bickers Dr Haynes Kerns, NP Lyda Jester, [place] [facility] [place], [place] Audry Riles, PharmD   Please be sure to bring in all your medications bottles to every appointment.

## 2021-12-20 NOTE — Progress Notes (Signed)
Advanced Heart Failure Clinic Note   PCP:  Ann Held, DO  HF Cardiologist: Dr. [name]  History of Present Illness: Heidi Stark is a 81 y.o. female who has history of HTN, DM, hyperlipidemia.  She was admitted in 4/15 with splenic and renal infarcts.  She was found to have paroxysmal atrial fibrillation.  In 4/15, she had fever and flank pain.  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  TEE showed prominent aortic plaque.  While she was in the hospital, she was noted to have a brief run of atrial fibrillation and was started on coumadin.     She has left leg pain with ambulation after about 100 feet, LE dopplers 5/15 showed occluded left external iliac artery. This has been present ever since she had left iliac damage with a prior back surgery.     She was stable until 1/18, when she developed palpitations and exertional dyspnea.  She was admitted to [facility] with atrial fibrillation/RVR and acute systolic CHF.  EF was found to be 30-35%, down from 50-55% in 2015.  She was diuresed and underwent DCCV back to NSR.  She subsequently had atrial fibrillation ablation in 4/18.  She went back into atrial fibrillation in 5/19 and had DCCV back today NSR.    Echo was done 6/19 and reviewed, EF 55%, mild LVH, moderate diastolic dysfunction, normal RV size and systolic function.    With recurrent atrial fibrillation, she was admitted in 10/19 to start Tikosyn.  She was cardioverted back to NSR.   She had a recent admission in 2/22 with lightheadedness/presyncope that appeared to correlate with runs of VT.  She was on Tikosyn for atrial fibrillation though VT not thought to be due to Tikosyn (not polymorphic, QT not prolonged).  Tikosyn was stopped and amiodarone was started.  Echo in 2/22 showed EF 50-55% but cMRI (2/22) showed EF 40% with subendocardial scar in the basal to mid inferolateral wall, RV EF was 38%.  She had coronary angiography showing  no obstructive disease.  VT subsided and she was sent home on amiodarone.    09/13/20, she had 3 episodes of presyncope in succession while sitting at a desk.  No prodrome, each episode of severe lightheadedness lasted maybe 30 seconds.  She did not pass out. Because of these events, she came to the ER. She was admitted for observation and amiodarone gtt was restarted.  She had no VT overnight.  She remained in atypical atrial flutter today (was in atypical flutter at last admission). She had a single chamber Medtronic ICD placed 09/15/20. Discharge weight 256 lbs.   She had post hospital f/u on 09/22/20 and was noted to be back in atrial flutter w/ CVR. She was set up for repeat DCCV 3 weeks later. She underwent DCCV on 10/11/20 and converted back to NSR.   She had COVID-19 in 9/22.   Follow up 09/01/21, she was volume overloaded and instructed to take metolazone 2.5 + extra 40 KCL x 2 days.  Multiple episodes of VT on 09/06/21, received ATP but no shocks. Follow up with EP 09/08/21 and started on mexiletine 250 bid.  On 11/28/21, she had VT with ATP and 1 ICD shock.  She had not taken amiodarone or mexiletine for a couple of days when this occurred.  She saw Dr. [name], meds were not changed.   Today she returns for HF followup. She is most limited by  chronic low back pain, not very active.  She is not short of breath walking around her house. No chest pain.  No orthopnea/PND.  No palpitations/syncope.   MDT device interrogation: Fluid index > threshold recently, now back down.  No VT since 5/15/123.  No AF.    ECG (personally reviewed): NSR, 1st degree AVB, RBBB  Labs (4/15): K 4.1, creatinine 0.7, HCT 46.2 Labs (7/15): K 3.9, creatinine 0.8, HCT 50 Labs (12/15): K 5, creatinine 0.7, HDL 33, LDL 57 Labs (5/16): K 4, creatinine 0.84, TGs 256, HDL 29, LDL 42, HCT 41.2 Labs (1/18): K 3.4, creatinine 0.78 Labs (2/18): K 4.4, creatinine 0.8, free T3/T4 normal, mildly elevated TSH, LFTs normal, hgb  15.6 Labs (4/18): K 4.2, creatinine 0.86, TSH elevated, free T3 and free T4 normal Labs (5/19): K 3.9, creatinine 0.78 Labs (6/19): K 4.4, creatinine 0.88 Labs (12/19): K 4.2, creatinine 0.85, TGs 322, LDL 77 Labs (2/20): LDL 46, TGs 362 Labs (3/20): K 4.7, creatinine 1.07 Labs (11/20): TGs 282, LDL 92 Labs (3/22): K 4.1, creatinine 1.23 Labs (4/22): K 4.2, creatinine 1.2, LFTs normal, TSH mildly elevated, free T4 mildly elevated, normal T3.  Labs (6/22): TSH mildly elevated, free T3 and T4 normal Labs (7/22): TGs 292, LDL 94 Labs (10/22): K 3.8, creatinine 1.04, BNP 62, hgb 14.1, LFTs normal Labs (2/23): K 3.5, creatinine 1.75, mag 2.5 Labs (3/23): TSH 8.31 Labs (5/23): BNP 542, K 3.8, creatinine 1.7  PMH: 1.Type II diabetes 2. HTN 3. Hyperlipidemia 4. COPD: has quit smoking.  5. CAD: Coronary CTA (4/18) with nonobstructive coronary disease, calcium score 335 Agatston units (82nd percentile).  - Coronary angiography (2/22) with no obstructive disease 6. Paroxysmal atrial fibrillation: First noted in 4/15.  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   - Event monitor (4/15) with no atrial fibrillation.  - Admission with atrial fibrillation/RVR in 1/18.  - Atrial fibrillation ablation in 4/18.  - DCCV in 5/19.  - Tikosyn started + DCCV in 10/19. VT => Tikosyn stopped and amiodarone begun in 3/22.   - S/p single chamber MDT ICD 09/15/20 - DCCV to NSR in 3/22.  7. Chronic systolic CHF: ?tachycardia-mediated as first noted in setting of atrial fibrillation with RVR.  - TEE (4/15) with EF 50-55%, mild LVH, grade III-IV plaque in descending thoracic aorta, RV normal, no PFO, peak RV-RA gradient 42 mmHg.   - Echo (1/18) with EF 30-35%, diffuse hypokinesis, moderate LVH, mildly dilated RV, PASP 55 mmHg.  - Echo (4/18) with EF 50%, anterolateral/inferolateral mild hypokinesis, mild aortic stenosis, IVC normal.  - Echo (6/19) with EF 55%, mild LVH,  moderate diastolic dysfunction, normal RV size and systolic function.  - cMRI (2/22) with EF 40% with subendocardial scar in the basal to mid inferolateral wall, RV EF was 38%. - Echo (2/22) with EF 50-55%, normal RV.  7. H/o diskectomy. 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 Lower extremity arterial dopplers (5/15) with occluded left external iliac artery.  - ABIs (5/18): 1.16 (normal) on right, 0.56 on left.  9. Aortic stenosis: Mild on 4/18 echo.  10. OSA: Uses CPAP 11. VT: 2/22.  No significant CAD on cath in 2/22.  cMRI with EF 40% with subendocardial scar in the basal to mid inferolateral wall, RV EF was 38%. - Medtronic ICD placed 3/22.  12. COVID-19 9/22  Current Outpatient Medications  Medication Sig Dispense Refill   acetaminophen (TYLENOL) 500 MG tablet Take 500  mg by mouth every 6 (six) hours as needed for moderate pain or headache.     albuterol (VENTOLIN HFA) 108 (90 Base) MCG/ACT inhaler Inhale 2 puffs into the lungs every 4 (four) hours as needed for wheezing or shortness of breath. 8 g 0   amiodarone (PACERONE) 200 MG tablet Take 1 tablet (200 mg total) by mouth daily. 90 tablet 3   apixaban (ELIQUIS) 5 MG TABS tablet TAKE 1 TABLET BY MOUTH TWICE A DAY 60 tablet 3   BD PEN NEEDLE NANO 2ND GEN 32G X 4 MM MISC See admin instructions.     eplerenone (INSPRA) 25 MG tablet TAKE 1 TABLET (25 MG TOTAL) BY MOUTH DAILY. 90 tablet 1   FARXIGA 10 MG TABS tablet TAKE 1 TABLET BY MOUTH EVERY DAY BEFORE BREAKFAST 30 tablet 6   fenofibrate (TRICOR) 145 MG tablet TAKE 1 TABLET BY MOUTH EVERY DAY 90 tablet 3   HUMALOG MIX 75/25 KWIKPEN (75-25) 100 UNIT/ML KwikPen Inject 30-40 Units into the skin See admin instructions. Take 40 units in the AM, then take 30 units in the afternoon per patient     icosapent Ethyl (VASCEPA) 1 g capsule TAKE 2 CAPSULES BY MOUTH TWICE A DAY 120 capsule 5   KLOR-CON M10 10 MEQ tablet TAKE 2 TABLETS BY MOUTH 2 TIMES DAILY. 360 tablet 3   LIVALO  4 MG TABS TAKE 1 TABLET BY MOUTH EVERY DAY 90 tablet 3   metoprolol succinate (TOPROL-XL) 25 MG 24 hr tablet TAKE 1 TABLET BY MOUTH EVERYDAY AT BEDTIME 90 tablet 3   mexiletine (MEXITIL) 250 MG capsule Take 1 capsule (250 mg total) by mouth 2 (two) times daily. 60 capsule 3   Multiple Vitamin (MULTIVITAMIN WITH MINERALS) TABS tablet Take 1 tablet by mouth daily.     ONETOUCH VERIO test strip 1 each 3 (three) times daily.     rOPINIRole (REQUIP) 0.25 MG tablet TAKE 2 TABLETS BY MOUTH AT BEDTIME 180 tablet 0   sacubitril-valsartan (ENTRESTO) 49-51 MG Take 1 tablet by mouth 2 (two) times daily. 60 tablet 3   Semaglutide, 1 MG/DOSE, (OZEMPIC, 1 MG/DOSE,) 4 MG/3ML SOPN Inject 1 mg into the skin once a week.     furosemide (LASIX) 40 MG tablet Take 2 tablets (80 mg total) by mouth in the morning AND 1 tablet (40 mg total) every evening. 180 tablet 3   No current facility-administered medications for this encounter.    Allergies:   Neomycin-bacitracin zn-polymyx, Sulfonamide derivatives, Ciprofloxacin, and Spironolactone   Social History:  The patient  reports that she has been smoking cigarettes. She has a 50.00 pack-year smoking history. She has never used smokeless tobacco. She reports that she does not drink alcohol and does not use drugs.   Family History:  The patient's family history includes Cancer in her mother; Diabetes in her father, mother, and another family member; Factor V Leiden deficiency in her daughter; Hypertension in her mother; Melanoma in an other family member; Obesity in her father and mother.   ROS:  Please see the history of present illness.   All other systems are personally reviewed and negative.   BP (!) 90/50   Pulse 64   Wt 120 kg (264 lb 9.6 oz)   SpO2 95%   BMI 42.71 kg/m   Wt Readings from Last 3 Encounters:  12/20/21 120 kg (264 lb 9.6 oz)  12/08/21 120.7 kg (266 lb)  10/13/21 122 kg (269 lb)   PHYSICAL EXAM: General: NAD Neck:  No JVD, no thyromegaly or  thyroid nodule.  Lungs: Distant BS CV: Nondisplaced PMI.  Heart regular S1/S2, no S3/S4, no murmur.  No peripheral edema.  No carotid bruit.  Unable to palpate left pedal pulses.  Abdomen: Soft, nontender, no hepatosplenomegaly, no distention.  Skin: Intact without lesions or rashes.  Neurologic: Alert and oriented x 3.  Psych: Normal affect. Extremities: No clubbing or cyanosis.  HEENT: Normal.   Recent Labs: 04/23/2021: Hemoglobin 14.1; Platelets 240 09/16/2021: B Natriuretic Peptide 73.9 12/06/2021: Magnesium 2.3; NT-Pro BNP 542 12/20/2021: ALT 30; BUN 44; Creatinine, Ser 2.22; Potassium 4.1; Sodium 135; TSH 7.892  Personally reviewed   ASSESSMENT AND PLAN: 1. H/o VT: noted 08/2020. 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 Had been on Tikosyn but this was not the typical Tikosyn-induced polymorphic VT and she had had no changes in Tikosyn dosing prior to occurrence. cMRI showed subendocardial LGE in the basal to mid inferolateral wall. This appeared to be a coronary disease pattern however coronary angiogram did not show significant coronary disease.  She was transitioned from Tikosyn to amiodarone and underwent Medtronic single chamber ICD on 09/15/20. VT on 09/06/21 and was started on mexiletine.  VT again on 11/28/21 after missing amiodarone and mexiletine.  - Continue amiodarone 200 mg daily, check LFTs and TSH today. She will need a regular eye exam.  - Continue mexiletine. 2. Atrial fibrillation/flutter: Paroxysmal.  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 She was admitted in 1/18 with atrial fibrillation/RVR and CHF.  She was diuresed and had DCCV back to NSR. She had atrial fibrillation ablation in 4/18.  In 5/19, she went into atrial fibrillation transiently.  In 10/19, she was back in atrial fibrillation and was admitted for Tikosyn initiation and cardioversion.  Atypical atrial flutter noted in 12/21 with DCCV to NSR. As outlined above, now off  Tikosyn and on amiodarone. Underwent repeat DCCV in 3/22.  NSR today.  - Continue amiodarone 200 mg daily.  - Continue Eliquis 5 mg bid.  3. Claudication: Left leg claudication with absent left PT pulse.  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   4. Chronic systolic => diastolic CHF: EF 61-95% on echo in 1/18, down from 50-55% in 4/15.  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 In 2/22, cMRI showed LV EF 40% with RV E 38%, inferolateral subendocardial LGE, but echo in 2/22 showed EF 50-55%.  Stable NYHA II-early III, functional status limited by body habitus and chronic back pain. She does not look volume overloaded by exam or Optivol.    - Continue Lasix 80 mg bid and metolazone 2.5 mg every other Friday. BMET today.   - Continue Toprol XL 25 mg daily.   - Continue Entresto 49/51 mg bid => may need to cut back with SBP 90 today.  - Continue eplerenone 25 mg daily.  - Continue dapagliflozin 10 mg daily.  - Discussed low salt food choices and limiting fluid intake to < 2 liters per day.  - I will arrange for echo.  5. CAD: Coronary CTA in 2018 showed coronary calcium score in the 82nd percentile with nonobstructive coronary disease. Cath in 2/22 showed only luminal irregularities. She denies anginal symptoms.  - Not on ASA due to Eliquis.  - Continue Vascepa and Livalo.  6. COPD:  History of smoking.  Quit 2/22.  7. OSA: Continue CPAP.  8. Obesity: Body mass index is 42.71 kg/m. - On semaglutide.  Follow up in 2 months with APP  Signed, [name], MD  12/20/2021  [facility] [address] [facility] [place] [address] [phone] (office) [phone] (fax)

## 2021-12-20 NOTE — Telephone Encounter (Addendum)
Pt aware, agreeable, and verbalized understanding  Labs scheduled, follow up appointment scheduled and med list adjusted   ----- Message from Larey Dresser, MD sent at 12/20/2021  4:19 PM EDT ----- Creatinine up.  Hold Lasix for a day then decrease to 80 qam/40 qpm.  Stop metolazone.  Repeat BMET 1 week.  Followup APP in 3-4 weeks to reassess volume.

## 2021-12-21 LAB — HEMOGLOBIN A1C: Hemoglobin A1C: 6

## 2021-12-22 LAB — T3, FREE: T3, Free: 2.5 pg/mL (ref 2.0–4.4)

## 2021-12-22 NOTE — Telephone Encounter (Signed)
Pt's daughter picked up [place] today.

## 2021-12-22 NOTE — Telephone Encounter (Signed)
Called pt again as she did not pick up Ozempic on Monday. States she forgot and would stop by today.

## 2021-12-23 NOTE — Progress Notes (Signed)
Remote ICD transmission.   

## 2021-12-26 ENCOUNTER — Other Ambulatory Visit (HOSPITAL_COMMUNITY): Payer: Self-pay | Admitting: Cardiology

## 2021-12-27 ENCOUNTER — Ambulatory Visit (HOSPITAL_COMMUNITY)
Admission: RE | Admit: 2021-12-27 | Discharge: 2021-12-27 | Disposition: A | Discharge status: Home / Self Care | Payer: Medicare Other | Source: Ambulatory Visit | Attending: Internal Medicine | Admitting: Internal Medicine

## 2021-12-27 DIAGNOSIS — I5022 Chronic systolic (congestive) heart failure: Secondary | ICD-10-CM | POA: Diagnosis present

## 2021-12-27 LAB — BASIC METABOLIC PANEL
Anion gap: 9 (ref 5–15)
BUN: 25 mg/dL — ABNORMAL HIGH (ref 8–23)
CO2: 27 mmol/L (ref 22–32)
Calcium: 8.2 mg/dL — ABNORMAL LOW (ref 8.9–10.3)
Chloride: 99 mmol/L (ref 98–111)
Creatinine, Ser: 1.63 mg/dL — ABNORMAL HIGH (ref 0.44–1.00)
GFR, Estimated: 32 mL/min — ABNORMAL LOW (ref >60)
Glucose, Bld: 88 mg/dL (ref 70–99)
Potassium: 4.1 mmol/L (ref 3.5–5.1)
Sodium: 135 mmol/L (ref 135–145)

## 2022-01-02 ENCOUNTER — Other Ambulatory Visit: Payer: Self-pay | Admitting: Cardiology

## 2022-01-02 ENCOUNTER — Other Ambulatory Visit (HOSPITAL_COMMUNITY): Payer: Self-pay | Admitting: Cardiology

## 2022-01-09 ENCOUNTER — Telehealth: Payer: Medicare Other

## 2022-01-11 ENCOUNTER — Telehealth: Payer: Self-pay | Admitting: *Deleted

## 2022-01-11 NOTE — Chronic Care Management (AMB) (Signed)
  Chronic Care Management Note  01/11/2022 Name: Heidi Stark MRN: [id number] DOB: [birth year]  Heidi Stark is a 81 y.o. year old female who is a primary care patient of Ann Held, DO and is actively engaged with the care management team. I reached out to [name] by phone today to assist with re-scheduling a follow up visit with the RN Case Manager  Follow up plan: Unsuccessful telephone outreach attempt made. A HIPAA compliant phone message was left for the patient providing contact information and requesting a return call.   Julian Hy, [facility]  Direct Dial: [phone]

## 2022-01-13 ENCOUNTER — Ambulatory Visit (INDEPENDENT_AMBULATORY_CARE_PROVIDER_SITE_OTHER): Payer: Medicare Other

## 2022-01-13 DIAGNOSIS — E1159 Type 2 diabetes mellitus with other circulatory complications: Secondary | ICD-10-CM | POA: Diagnosis not present

## 2022-01-13 DIAGNOSIS — I5020 Unspecified systolic (congestive) heart failure: Secondary | ICD-10-CM

## 2022-01-13 DIAGNOSIS — Z794 Long term (current) use of insulin: Secondary | ICD-10-CM | POA: Diagnosis not present

## 2022-01-13 DIAGNOSIS — I509 Heart failure, unspecified: Secondary | ICD-10-CM | POA: Diagnosis not present

## 2022-01-13 DIAGNOSIS — I480 Paroxysmal atrial fibrillation: Secondary | ICD-10-CM

## 2022-01-13 DIAGNOSIS — I4891 Unspecified atrial fibrillation: Secondary | ICD-10-CM | POA: Diagnosis not present

## 2022-01-13 DIAGNOSIS — F17210 Nicotine dependence, cigarettes, uncomplicated: Secondary | ICD-10-CM

## 2022-01-13 NOTE — Chronic Care Management (AMB) (Signed)
Chronic Care Management   CCM RN Visit Note  01/13/2022 Name: Heidi Stark MRN: [id number] DOB: [birth year]  Subjective: Heidi Stark is a 81 y.o. year old female who is a primary care patient of Ann Held, DO. The care management team was consulted for assistance with disease management and care coordination needs.    Engaged with patient by telephone for follow up visit in response to provider referral for case management and/or care coordination services.   Consent to Services:  The patient was given information about Chronic Care Management services, agreed to services, and gave verbal consent prior to initiation of services.  Please see initial visit note for detailed documentation.   Patient agreed to services and verbal consent obtained.   Assessment: Review of patient past medical history, allergies, medications, health status, including review of consultants reports, laboratory and other test data, was performed as part of comprehensive evaluation and provision of chronic care management services.   SDOH (Social Determinants of Health) assessments and interventions performed:    CCM Care Plan  Allergies  Allergen Reactions   Neomycin-Bacitracin Zn-Polymyx Rash   Sulfonamide Derivatives Other (See Comments)    Stomach cramps   Ciprofloxacin Hives, Itching and Rash    Pt doesn't remember having any reaction to cipro   Spironolactone Rash    Outpatient Encounter Medications as of 01/13/2022  Medication Sig Note   acetaminophen (TYLENOL) 500 MG tablet Take 500 mg by mouth every 6 (six) hours as needed for moderate pain or headache.    albuterol (VENTOLIN HFA) 108 (90 Base) MCG/ACT inhaler Inhale 2 puffs into the lungs every 4 (four) hours as needed for wheezing or shortness of breath.    amiodarone (PACERONE) 200 MG tablet Take 1 tablet (200 mg total) by mouth daily.    apixaban (ELIQUIS) 5 MG TABS tablet TAKE 1 TABLET BY MOUTH TWICE A DAY    BD PEN NEEDLE  NANO 2ND GEN 32G X 4 MM MISC See admin instructions.    ENTRESTO 24-26 MG TAKE 1 TABLET BY MOUTH TWICE A DAY    eplerenone (INSPRA) 25 MG tablet TAKE 1 TABLET (25 MG TOTAL) BY MOUTH DAILY.    FARXIGA 10 MG TABS tablet TAKE 1 TABLET BY MOUTH EVERY DAY BEFORE BREAKFAST 08/25/2021: Patient approve for [employer] patient assistance program thru 07/16/2022   fenofibrate (TRICOR) 145 MG tablet TAKE 1 TABLET BY MOUTH EVERY DAY    furosemide (LASIX) 40 MG tablet Take 2 tablets (80 mg total) by mouth in the morning AND 1 tablet (40 mg total) every evening.    HUMALOG MIX 75/25 KWIKPEN (75-25) 100 UNIT/ML KwikPen Inject 30-40 Units into the skin See admin instructions. Take 40 units in the AM, then take 30 units in the afternoon per patient 09/16/2021: Approved 08/30/2021 to receive from [employer] thru 07/16/2022   icosapent Ethyl (VASCEPA) 1 g capsule TAKE 2 CAPSULES BY MOUTH TWICE A DAY    KLOR-CON M10 10 MEQ tablet TAKE 2 TABLETS BY MOUTH 2 TIMES DAILY.    LIVALO 4 MG TABS TAKE 1 TABLET BY MOUTH EVERY DAY    metoprolol succinate (TOPROL-XL) 25 MG 24 hr tablet TAKE 1 TABLET BY MOUTH EVERYDAY AT BEDTIME    mexiletine (MEXITIL) 250 MG capsule TAKE 1 CAPSULE BY MOUTH 2 TIMES DAILY.    Multiple Vitamin (MULTIVITAMIN WITH MINERALS) TABS tablet Take 1 tablet by mouth daily.    ONETOUCH VERIO test strip 1 each 3 (three) times daily.  rOPINIRole (REQUIP) 0.25 MG tablet TAKE 2 TABLETS BY MOUTH AT BEDTIME    sacubitril-valsartan (ENTRESTO) 49-51 MG Take 1 tablet by mouth 2 (two) times daily. 09/14/2021: Approved to receive from medication assistance program thru 07/16/2022   Semaglutide, 1 MG/DOSE, (OZEMPIC, 1 MG/DOSE,) 4 MG/3ML SOPN Inject 1 mg into the skin once a week.    No facility-administered encounter medications on file as of 01/13/2022.    Patient Active Problem List   Diagnosis Date Noted   Diabetic nephropathy ([place]) 10/13/2021   Hyperglycemia due to type 2 diabetes mellitus ([place]) 10/13/2021    Proteinuria 10/13/2021   Pure hypercholesterolemia 10/13/2021   COVID-19 04/15/2021   Respiratory failure with hypoxia ([place]) 03/55/9741   Systolic CHF ([place]) 63/84/5364   CKD (chronic kidney disease) stage 3, GFR 30-59 ml/min (HCC) 04/15/2021   Elevated troponin 04/15/2021   Acute on chronic systolic (congestive) heart failure ([place]) 09/14/2020   Wide-complex tachycardia 09/07/2020   Ventricular tachyarrhythmia ([place]) 09/07/2020   VT (ventricular tachycardia) ([place]) 09/07/2020   Persistent atrial fibrillation ([place]) 04/30/2018   Wound of left leg 10/01/2017   Cellulitis of right lower extremity 11/27/2016   Encounter for assessment for deep vein thrombosis (DVT) 11/14/2016   Hypokalemia 08/13/2016   Type 2 diabetes mellitus with hyperglycemia, with long-term current use of insulin (HCC)    Atherosclerosis of aorta ([place]) 06/26/2016   Colitis 11/24/2014   Severe obesity (BMI >= 40) ([place]) 11/12/2013   PAF (paroxysmal atrial fibrillation) ([place]) 11/03/2013   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 10/28/2013   COLONIC POLYPS 08/16/2010   PAD (peripheral artery disease) ([place]) 08/15/2010   HLD (hyperlipidemia) 11/18/2009   MYALGIA 11/18/2009   OBESITY 05/14/2007   PULMONARY NODULE, RIGHT MIDDLE LOBE 05/14/2007   Obstructive sleep apnea 12/06/2006   Essential hypertension 12/06/2006   COPD (chronic obstructive pulmonary disease) ([place]) 12/06/2006    Conditions to be addressed/monitored:Atrial Fibrillation, CHF, HTN, and DMII  Care Plan : RN Care Manager Plan of Care  Updates made by Luretha Rued, RN since 01/13/2022 12:00 AM  Completed 01/13/2022   Problem: No plan of Care Established for Management of Chronic Disease Resolved 01/13/2022  Priority: High     Long-Range Goal: Development of Plan of Care for Chronic Disease Managment Completed 01/13/2022  Start Date: 05/12/2021  Expected End Date: 03/12/2022  Priority: High  Note:   Goals Met-Case  closed Current Barriers:  01/13/22 [name] reports she continues to monitor health, attending provider visits as scheduled, she states CBG's "are good"- Blood sugar 109 today. BP 110/70 today. She denies any signs/symptoms of HF exacerbation or arrhythmias and reports she is seeing providers as scheduled. Case Closure discussed. Mrs. Brege is in agreement. Annual visit and/or AWV due- per chart completed 10/13/21 Chronic Disease Management support and education needs related to Atrial Fibrillation, CHF, COPD, DMII, and chronic low back pain   RNCM Clinical Goal(s):  Patient will verbalize understanding of plan for management of Atrial Fibrillation, CHF, COPD, DMII, and chronic low back pain as evidenced by self report and/or chart notation take all medications exactly as prescribed and will call provider for medication related questions as evidenced by self report and/or chart notation demonstrate ongoing self health care management ability   as evidenced by attending provider visits as scheduled, taking medications as prescribed, contacting provider with questions and concerns as needed  through collaboration with RN Care manager, provider, and care team.   Interventions: 1:1 collaboration with primary care  provider regarding development and update of comprehensive plan of care as evidenced by provider attestation and co-signature Inter-disciplinary care team collaboration (see longitudinal plan of care) Evaluation of current treatment plan related to  self management and patient's adherence to plan as established by provider  Arrythmias: AFIB/VT Interventions: (Status:  Goal Met.) Long Term Goal She reports that she is being followed closely by Dr. [name] and Dr. [name] and has no issues at this time Encouraged to continue to take as prescribed Reviewed upcoming appointments with patient and encouraged to continue to attend as recommended/scheduled  Hypertension Interventions: (Status:   Goal Met) Long Term Goal 01/13/22 Home BP check 110/70 today BP Readings from Last 3 Encounters:  12/20/21 (!) 90/50  12/08/21 102/62  09/16/21 (!) 104/54  Most recent eGFR/CrCl:  Lab Results  Component Value Date   EGFR 30 (L) 12/06/2021    No components found for: CRCL Discussed BP readings with client Encouraged to continue to take medications as prescribed Discussed diet and encouraged to continue to monitor salt intake Provided positive feedback regarding self health management  Diabetes Interventions: (Status:  Goal Met) Long Term Goal 01/13/22 report blood sugars have been great BS 109 today Assessed patient's understanding of A1c goal: <8% Encouraged to continue to take as prescribed Reviewed A1C goal; Fasting BS goal 80-130 and <180 after meals. Discussed diet intake. Encouraged to continue to eat healthy-low sodium, carb modified  Encouraged to continue to attend provider visits as recommended Encouraged to continue to work with clinical pharmacist: engaged with cardiology pharmacist. clinical pharmacist with PCP also engaged in care Lab Results  Component Value Date   HGBA1C 9.0 (H) 04/15/2021  COPD Interventions: (Status:  Goal Met) Long Term Goal 01/13/22 Denies any issues with breathing Encouraged to continue to take as prescribed. Encouraged to continue to attend provider visits as scheduled  Heart Failure Interventions: (Status: Goal Met). Long Term Goal Denies any signs or symptoms of exacerbation. She states she is being closely by Dr. [name] and Dr. [name] Reviewed signs/symptoms of HF exacerbation Encouraged to continue to attend provider visits as scheduled Provided positive feedback regarding self health management Encouraged to continue to take medications as prescribed  Patient Goals/Self-Care Activities: Continue to take medications as prescribed Continue to attend all recommended/scheduled provider appointments Continue to call provider office for new  concerns or questions Continue to be active and exercise per provider recommendation Continue to check your blood sugars as recommended by your provider, record and take to your provider visits Continue to eat healthy: : eat plenty of vegetables, fruits, lean meats; low fat. Limit: sugars, rice, potatoes, pasta. Monitor salt intake and fluid intake   Plan:No further follow up required: Patient instructed to contact primary care provider, if care management or care coordination needs in the future.  Thea Silversmith, RN, MSN, BSN, CCM Care Management Coordinator [facility] [phone]

## 2022-01-13 NOTE — Patient Instructions (Signed)
Visit Information  Thank you for allowing me to share the care management and care coordination services that are available to you as part of your health plan and services through your primary care provider and medical home. Please reach out to your primary care provider or me at [phone], if the care management/care coordination team may be of assistance to you in the future.   Thea Silversmith, RN, MSN, BSN, CCM Care Management Coordinator [facility] [phone]

## 2022-01-19 ENCOUNTER — Other Ambulatory Visit: Payer: Self-pay | Admitting: Cardiology

## 2022-01-19 ENCOUNTER — Other Ambulatory Visit: Payer: Self-pay | Admitting: Family Medicine

## 2022-01-20 ENCOUNTER — Encounter: Payer: Self-pay | Admitting: *Deleted

## 2022-01-20 NOTE — Telephone Encounter (Signed)
Prescription refill request for Heidi Stark received. Indication: PAF Last office visit: 12/20/21  Einar Crow MD Scr: 1.63 on 12/27/21 Age: 81 Weight: 120kg  Patient is on Heidi Stark '5mg'$  twice daily.  Based on above findings (age recently turned 28 and SCr of 1.63) recommended dose would be 2.'5mg'$  twice daily.  Message sent to Dr [name] to approve dose reduction before refilling medication.

## 2022-01-23 ENCOUNTER — Encounter (HOSPITAL_COMMUNITY): Payer: Self-pay

## 2022-01-23 ENCOUNTER — Encounter: Payer: Self-pay | Admitting: Cardiology

## 2022-01-23 ENCOUNTER — Ambulatory Visit (HOSPITAL_COMMUNITY)
Admission: RE | Admit: 2022-01-23 | Discharge: 2022-01-23 | Disposition: A | Discharge status: Home / Self Care | Payer: Medicare Other | Source: Ambulatory Visit | Attending: Family Medicine | Admitting: Family Medicine

## 2022-01-23 VITALS — BP 116/60 | HR 59 | Wt 268.2 lb

## 2022-01-23 DIAGNOSIS — I5022 Chronic systolic (congestive) heart failure: Secondary | ICD-10-CM

## 2022-01-23 DIAGNOSIS — I480 Paroxysmal atrial fibrillation: Secondary | ICD-10-CM | POA: Diagnosis present

## 2022-01-23 DIAGNOSIS — I472 Ventricular tachycardia, unspecified: Secondary | ICD-10-CM

## 2022-01-23 DIAGNOSIS — Z79899 Other long term (current) drug therapy: Secondary | ICD-10-CM | POA: Insufficient documentation

## 2022-01-23 DIAGNOSIS — E669 Obesity, unspecified: Secondary | ICD-10-CM | POA: Diagnosis not present

## 2022-01-23 DIAGNOSIS — G4733 Obstructive sleep apnea (adult) (pediatric): Secondary | ICD-10-CM

## 2022-01-23 DIAGNOSIS — M545 Low back pain, unspecified: Secondary | ICD-10-CM | POA: Insufficient documentation

## 2022-01-23 DIAGNOSIS — I484 Atypical atrial flutter: Secondary | ICD-10-CM | POA: Diagnosis not present

## 2022-01-23 DIAGNOSIS — Z87891 Personal history of nicotine dependence: Secondary | ICD-10-CM | POA: Diagnosis not present

## 2022-01-23 DIAGNOSIS — G8929 Other chronic pain: Secondary | ICD-10-CM | POA: Diagnosis not present

## 2022-01-23 DIAGNOSIS — Z6841 Body Mass Index (BMI) 40.0 and over, adult: Secondary | ICD-10-CM | POA: Diagnosis not present

## 2022-01-23 DIAGNOSIS — Z7901 Long term (current) use of anticoagulants: Secondary | ICD-10-CM | POA: Insufficient documentation

## 2022-01-23 DIAGNOSIS — J449 Chronic obstructive pulmonary disease, unspecified: Secondary | ICD-10-CM

## 2022-01-23 DIAGNOSIS — I739 Peripheral vascular disease, unspecified: Secondary | ICD-10-CM | POA: Diagnosis not present

## 2022-01-23 DIAGNOSIS — I2510 Atherosclerotic heart disease of native coronary artery without angina pectoris: Secondary | ICD-10-CM

## 2022-01-23 DIAGNOSIS — I110 Hypertensive heart disease with heart failure: Secondary | ICD-10-CM | POA: Insufficient documentation

## 2022-01-23 DIAGNOSIS — Z95810 Presence of automatic (implantable) cardiac defibrillator: Secondary | ICD-10-CM | POA: Insufficient documentation

## 2022-01-23 DIAGNOSIS — E785 Hyperlipidemia, unspecified: Secondary | ICD-10-CM | POA: Insufficient documentation

## 2022-01-23 DIAGNOSIS — E1151 Type 2 diabetes mellitus with diabetic peripheral angiopathy without gangrene: Secondary | ICD-10-CM | POA: Diagnosis not present

## 2022-01-23 DIAGNOSIS — R946 Abnormal results of thyroid function studies: Secondary | ICD-10-CM | POA: Insufficient documentation

## 2022-01-23 LAB — BASIC METABOLIC PANEL
Anion gap: 14 (ref 5–15)
BUN: 24 mg/dL — ABNORMAL HIGH (ref 8–23)
CO2: 24 mmol/L (ref 22–32)
Calcium: 8.6 mg/dL — ABNORMAL LOW (ref 8.9–10.3)
Chloride: 97 mmol/L — ABNORMAL LOW (ref 98–111)
Creatinine, Ser: 1.58 mg/dL — ABNORMAL HIGH (ref 0.44–1.00)
GFR, Estimated: 33 mL/min — ABNORMAL LOW (ref >60)
Glucose, Bld: 105 mg/dL — ABNORMAL HIGH (ref 70–99)
Potassium: 4.4 mmol/L (ref 3.5–5.1)
Sodium: 135 mmol/L (ref 135–145)

## 2022-01-23 LAB — BRAIN NATRIURETIC PEPTIDE: B Natriuretic Peptide: 47.9 pg/mL (ref 0.0–100.0)

## 2022-01-23 MED ORDER — FUROSEMIDE 40 MG PO TABS: 80.0000 mg | ORAL_TABLET | Freq: Two times a day (BID) | ORAL | 3 refills | Status: DC

## 2022-01-23 NOTE — Progress Notes (Signed)
Advanced Heart Failure Clinic Note   PCP:  Ann Held, DO  HF Cardiologist: Dr. [name]  HPI: Heidi Stark is a 81 y.o. female who has history of HTN, DM, hyperlipidemia.  She was admitted in 4/15 with splenic and renal infarcts.  She was found to have paroxysmal atrial fibrillation.  In 4/15, she had fever and flank pain.  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  TEE showed prominent aortic plaque.  While she was in the hospital, she was noted to have a brief run of atrial fibrillation and was started on coumadin.     She has left leg pain with ambulation after about 100 feet, LE dopplers 5/15 showed occluded left external iliac artery. This has been present ever since she had left iliac damage with a prior back surgery.     She was stable until 1/18, when she developed palpitations and exertional dyspnea.  She was admitted to [facility] with atrial fibrillation/RVR and acute systolic CHF.  EF was found to be 30-35%, down from 50-55% in 2015.  She was diuresed and underwent DCCV back to NSR.  She subsequently had atrial fibrillation ablation in 4/18.  She went back into atrial fibrillation in 5/19 and had DCCV back today NSR.    Echo was done 6/19 and reviewed, EF 55%, mild LVH, moderate diastolic dysfunction, normal RV size and systolic function.    With recurrent atrial fibrillation, she was admitted in 10/19 to start Tikosyn.  She was cardioverted back to NSR.   She had a recent admission in 2/22 with lightheadedness/presyncope that appeared to correlate with runs of VT.  She was on Tikosyn for atrial fibrillation though VT not thought to be due to Tikosyn (not polymorphic, QT not prolonged).  Tikosyn was stopped and amiodarone was started.  Echo in 2/22 showed EF 50-55% but cMRI (2/22) showed EF 40% with subendocardial scar in the basal to mid inferolateral wall, RV EF was 38%.  She had coronary angiography showing no obstructive disease.   VT subsided and she was sent home on amiodarone.    09/13/20, she had 3 episodes of presyncope in succession while sitting at a desk.  No prodrome, each episode of severe lightheadedness lasted maybe 30 seconds.  She did not pass out. Because of these events, she came to the ER. She was admitted for observation and amiodarone gtt was restarted.  She had no VT overnight.  She remained in atypical atrial flutter today (was in atypical flutter at last admission). She had a single chamber Medtronic ICD placed 09/15/20. Discharge weight 256 lbs.   She had post hospital f/u on 09/22/20 and was noted to be back in atrial flutter w/ CVR. She was set up for repeat DCCV 3 weeks later. She underwent DCCV on 10/11/20 and converted back to NSR.   She had COVID-19 in 9/22.   Follow up 09/01/21, she was volume overloaded and instructed to take metolazone 2.5 + extra 40 KCL x 2 days.  Multiple episodes of VT on 09/06/21, received ATP but no shocks. Follow up with EP 09/08/21 and started on mexiletine 250 bid.  On 11/28/21, she had VT with ATP and 1 ICD shock.  She had not taken amiodarone or mexiletine for a couple of days when this occurred.  She saw Dr. [name], meds were not changed.   Follow up 6/23, stable NYHA II, volume ok. Labs showed creatinine elevated at 2.2,  instructed to hold Lasix x 1 days, then decrease to 80/40. Metolazone was also stopped.  Today she returns for HF follow up. Overall feeling fine. Main complaint is low back pain, this greatly limits her physically. She is able to walk around the house with her walker if she takes her time without significant dyspnea. She has not noticed increased swelling since holding metolazone and decreasing lasix dose. Denies palpitations, CP, dizziness, edema, or PND/Orthopnea. Appetite ok. No fever or chills. She does not weigh at home regularly. Taking all medications.  MDT device interrogation (personally reviewed): Fluid index > threshold . No VT since 5/15/123.   No AF.    ECG (personally reviewed): none ordered today.  Labs (4/15): K 4.1, creatinine 0.7, HCT 46.2 Labs (7/15): K 3.9, creatinine 0.8, HCT 50 Labs (12/15): K 5, creatinine 0.7, HDL 33, LDL 57 Labs (5/16): K 4, creatinine 0.84, TGs 256, HDL 29, LDL 42, HCT 41.2 Labs (1/18): K 3.4, creatinine 0.78 Labs (2/18): K 4.4, creatinine 0.8, free T3/T4 normal, mildly elevated TSH, LFTs normal, hgb 15.6 Labs (4/18): K 4.2, creatinine 0.86, TSH elevated, free T3 and free T4 normal Labs (5/19): K 3.9, creatinine 0.78 Labs (6/19): K 4.4, creatinine 0.88 Labs (12/19): K 4.2, creatinine 0.85, TGs 322, LDL 77 Labs (2/20): LDL 46, TGs 362 Labs (3/20): K 4.7, creatinine 1.07 Labs (11/20): TGs 282, LDL 92 Labs (3/22): K 4.1, creatinine 1.23 Labs (4/22): K 4.2, creatinine 1.2, LFTs normal, TSH mildly elevated, free T4 mildly elevated, normal T3.  Labs (6/22): TSH mildly elevated, free T3 and T4 normal Labs (7/22): TGs 292, LDL 94 Labs (10/22): K 3.8, creatinine 1.04, BNP 62, hgb 14.1, LFTs normal Labs (2/23): K 3.5, creatinine 1.75, mag 2.5 Labs (3/23): TSH 8.31 Labs (5/23): BNP 542, K 3.8, creatinine 1.7 Labs (6/23): K 4.1, creatinine 1.63, AST 65, T Bili 1.5, TSH mildly elevated,, free T4 mildly elevated, free T3 normal  PMH: 1.Type II diabetes 2. HTN 3. Hyperlipidemia 4. COPD: has quit smoking.  5. CAD: Coronary CTA (4/18) with nonobstructive coronary disease, calcium score 335 Agatston units (82nd percentile).  - Coronary angiography (2/22) with no obstructive disease 6. Paroxysmal atrial fibrillation: First noted in 4/15.  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   - Event monitor (4/15) with no atrial fibrillation.  - Admission with atrial fibrillation/RVR in 1/18.  - Atrial fibrillation ablation in 4/18.  - DCCV in 5/19.  - Tikosyn started + DCCV in 10/19. VT => Tikosyn stopped and amiodarone begun in 3/22.   - S/p single chamber MDT ICD 09/15/20 -  DCCV to NSR in 3/22.  7. Chronic systolic CHF: ?tachycardia-mediated as first noted in setting of atrial fibrillation with RVR.  - TEE (4/15) with EF 50-55%, mild LVH, grade III-IV plaque in descending thoracic aorta, RV normal, no PFO, peak RV-RA gradient 42 mmHg.   - Echo (1/18) with EF 30-35%, diffuse hypokinesis, moderate LVH, mildly dilated RV, PASP 55 mmHg.  - Echo (4/18) with EF 50%, anterolateral/inferolateral mild hypokinesis, mild aortic stenosis, IVC normal.  - Echo (6/19) with EF 55%, mild LVH, moderate diastolic dysfunction, normal RV size and systolic function.  - cMRI (2/22) with EF 40% with subendocardial scar in the basal to mid inferolateral wall, RV EF was 38%. - Echo (2/22) with EF 50-55%, normal RV.  7. H/o diskectomy. 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 Lower extremity arterial dopplers (5/15) with occluded left external iliac artery.  -  ABIs (5/18): 1.16 (normal) on right, 0.56 on left.  9. Aortic stenosis: Mild on 4/18 echo.  10. OSA: Uses CPAP 11. VT: 2/22.  No significant CAD on cath in 2/22.  cMRI with EF 40% with subendocardial scar in the basal to mid inferolateral wall, RV EF was 38%. - Medtronic ICD placed 3/22.  12. COVID-19 9/22  Current Outpatient Medications  Medication Sig Dispense Refill   acetaminophen (TYLENOL) 500 MG tablet Take 500 mg by mouth every 6 (six) hours as needed for moderate pain or headache.     albuterol (VENTOLIN HFA) 108 (90 Base) MCG/ACT inhaler Inhale 2 puffs into the lungs every 4 (four) hours as needed for wheezing or shortness of breath. 8 g 0   amiodarone (PACERONE) 200 MG tablet Take 1 tablet (200 mg total) by mouth daily. 90 tablet 3   BD PEN NEEDLE NANO 2ND GEN 32G X 4 MM MISC See admin instructions.     ELIQUIS 5 MG TABS tablet TAKE 1 TABLET BY MOUTH TWICE A DAY 60 tablet 3   ENTRESTO 24-26 MG TAKE 1 TABLET BY MOUTH TWICE A DAY 180 tablet 3   eplerenone (INSPRA) 25 MG tablet TAKE 1 TABLET (25 MG TOTAL)  BY MOUTH DAILY. 90 tablet 1   FARXIGA 10 MG TABS tablet TAKE 1 TABLET BY MOUTH EVERY DAY BEFORE BREAKFAST 30 tablet 6   fenofibrate (TRICOR) 145 MG tablet TAKE 1 TABLET BY MOUTH EVERY DAY 90 tablet 3   furosemide (LASIX) 40 MG tablet Take 2 tablets (80 mg total) by mouth in the morning AND 1 tablet (40 mg total) every evening. 180 tablet 3   HUMALOG MIX 75/25 KWIKPEN (75-25) 100 UNIT/ML KwikPen Inject 30-40 Units into the skin See admin instructions. Take 40 units in the AM, then take 30 units in the afternoon per patient     icosapent Ethyl (VASCEPA) 1 g capsule TAKE 2 CAPSULES BY MOUTH TWICE A DAY 120 capsule 5   KLOR-CON M10 10 MEQ tablet TAKE 2 TABLETS BY MOUTH 2 TIMES DAILY. 360 tablet 3   levothyroxine (SYNTHROID) 50 MCG tablet 1 tablet in the morning on an empty stomach     LIVALO 4 MG TABS TAKE 1 TABLET BY MOUTH EVERY DAY 90 tablet 3   metoprolol succinate (TOPROL-XL) 25 MG 24 hr tablet TAKE 1 TABLET BY MOUTH EVERYDAY AT BEDTIME 90 tablet 3   mexiletine (MEXITIL) 250 MG capsule TAKE 1 CAPSULE BY MOUTH 2 TIMES DAILY. 180 capsule 3   Multiple Vitamin (MULTIVITAMIN WITH MINERALS) TABS tablet Take 1 tablet by mouth daily.     ONETOUCH VERIO test strip 1 each 3 (three) times daily.     rOPINIRole (REQUIP) 0.25 MG tablet TAKE 2 TABLETS BY MOUTH AT BEDTIME 180 tablet 0   sacubitril-valsartan (ENTRESTO) 49-51 MG Take 1 tablet by mouth 2 (two) times daily. 60 tablet 3   Semaglutide, 1 MG/DOSE, (OZEMPIC, 1 MG/DOSE,) 4 MG/3ML SOPN Inject 1 mg into the skin once a week.     No current facility-administered medications for this encounter.    Allergies:   Neomycin-bacitracin zn-polymyx, Sulfonamide derivatives, Ciprofloxacin, and Spironolactone   Social History:  The patient  reports that she has been smoking cigarettes. She has a 50.00 pack-year smoking history. She has never used smokeless tobacco. She reports that she does not drink alcohol and does not use drugs.   Family History:  The  patient's family history includes Cancer in her mother; Diabetes in her father, mother, and  another family member; Factor V Leiden deficiency in her daughter; Hypertension in her mother; Melanoma in an other family member; Obesity in her father and mother.   ROS:  Please see the history of present illness.   All other systems are personally reviewed and negative.   BP 116/60   Pulse (!) 59   Wt 121.7 kg (268 lb 3.2 oz)   SpO2 96%   BMI 43.29 kg/m   Wt Readings from Last 3 Encounters:  01/23/22 121.7 kg (268 lb 3.2 oz)  12/20/21 120 kg (264 lb 9.6 oz)  12/08/21 120.7 kg (266 lb)   PHYSICAL EXAM: General:  NAD. No resp difficulty, arrived in [facility] HEENT: Normal Neck: Supple. No JVD. Carotids 2+ bilat; no bruits. No lymphadenopathy or thryomegaly appreciated. Cor: PMI nondisplaced. Regular rate & rhythm. No rubs, gallops or murmurs. Lungs: Clear Abdomen: Obese, soft, nontender, nondistended. No hepatosplenomegaly. No bruits or masses. Good bowel sounds. Extremities: No cyanosis, clubbing, rash, edema Neuro: Alert & oriented x 3, cranial nerves grossly intact. Moves all 4 extremities w/o difficulty. Affect pleasant.  Recent Labs: 04/23/2021: Hemoglobin 14.1; Platelets 240 09/16/2021: B Natriuretic Peptide 73.9 12/06/2021: Magnesium 2.3; NT-Pro BNP 542 12/20/2021: ALT 30; TSH 7.892 12/27/2021: BUN 25; Creatinine, Ser 1.63; Potassium 4.1; Sodium 135  Personally reviewed   ASSESSMENT AND PLAN: 1. H/o VT: noted 08/2020. 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 Had been on Tikosyn but this was not the typical Tikosyn-induced polymorphic VT and she had had no changes in Tikosyn dosing prior to occurrence. cMRI showed subendocardial LGE in the basal to mid inferolateral wall. This appeared to be a coronary disease pattern however coronary angiogram did not show significant coronary disease.  She was transitioned from Tikosyn to amiodarone and underwent Medtronic single chamber ICD on  09/15/20. VT on 09/06/21 and was started on mexiletine.  VT again on 11/28/21 after missing amiodarone and mexiletine.  - Continue amiodarone 200 mg daily, LFTs ok, mildly elevated TSH and free T4 (6/23). Consider repeating thyroid function testing next visit. She will need a regular eye exam.  - Continue mexiletine. 2. Atrial fibrillation/flutter: Paroxysmal.  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 She was admitted in 1/18 with atrial fibrillation/RVR and CHF.  She was diuresed and had DCCV back to NSR. She had atrial fibrillation ablation in 4/18.  In 5/19, she went into atrial fibrillation transiently.  In 10/19, she was back in atrial fibrillation and was admitted for Tikosyn initiation and cardioversion.  Atypical atrial flutter noted in 12/21 with DCCV to NSR. As outlined above, now off Tikosyn and on amiodarone. Underwent repeat DCCV in 3/22.  Regular on exam today.  - Continue amiodarone 200 mg daily.  - Continue Eliquis 5 mg bid.  3. Claudication: Left leg claudication with absent left PT pulse.  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   4. Chronic systolic => diastolic CHF: EF 08-65% on echo in 1/18, down from 50-55% in 4/15.  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 In 2/22, cMRI showed LV EF 40% with RV E 38%, inferolateral subendocardial LGE, but echo in 2/22 showed EF 50-55%.  Stable NYHA II-early III, functional status limited by body habitus and chronic back pain. Weight is up 4 lbs, she does not appear markedly volume overloaded on exam but OptiVol is elevated.   -  Increase Lasix to 80 mg bid. BMET today.  BMET/BNP today, repeat in 1 week. - Continue Toprol XL 25 mg daily.   - Continue Entresto 49/51 mg bid. - Continue eplerenone 25 mg daily.  - Continue dapagliflozin 10 mg daily.  - Discussed low salt  food choices and limiting fluid intake to < 2 liters per day.  - Repeat echo has been arranged.  5. CAD: Coronary CTA in 2018 showed coronary calcium score in the 82nd percentile with nonobstructive coronary disease. Cath in 2/22 showed only luminal irregularities. She denies anginal symptoms.  - Not on ASA due to Eliquis.  - Continue Vascepa and Livalo.  6. COPD:  History of smoking. Quit 2/22.  7. OSA: Continue CPAP.  8. Obesity: Body mass index is 43.29 kg/m. - On semaglutide.  Keep follow up with APP in 3 weeks and follow up in 3 months with Dr. [name].  Signed, Rafael Bihari, FNP  01/23/2022  [facility] [place] [address] [facility] and [place] [address] [phone] (office) [phone] (fax)

## 2022-01-23 NOTE — Patient Instructions (Signed)
INCREASE Lasix to 80 mg twice a day  Labs today We will only contact you if something comes back abnormal or we need to make some changes. Otherwise no news is good news!  Labs needed in 2 weeks  Keep follow up as scheduled  Do the following things EVERYDAY: Weigh yourself in the morning before breakfast. Write it down and keep it in a log. Take your medicines as prescribed Eat low salt foods--Limit salt (sodium) to 2000 mg per day.  Stay as active as you can everyday Limit all fluids for the day to less than 2 liters  At the [facility], you and your health needs are our priority. As part of our continuing mission to provide you with exceptional heart care, we have created designated Provider Care Teams. These Care Teams include your primary Cardiologist (physician) and Advanced Practice Providers (APPs- Physician Assistants and Nurse Practitioners) who all work together to provide you with the care you need, when you need it.   You may see any of the following providers on your designated Care Team at your next follow up: Dr Glori Bickers Dr Haynes Kerns, NP Lyda Jester, [place] [facility] [place], [place] Audry Riles, PharmD   Please be sure to bring in all your medications bottles to every appointment.

## 2022-01-30 NOTE — Chronic Care Management (AMB) (Signed)
  Chronic Care Management Note  01/30/2022 Name: Heidi Stark MRN: [id number] DOB: February 21, 1941  Heidi Stark is a 81 y.o. year old female who is a primary care patient of Ann Held, DO and is actively engaged with the care management team. I reached out to [name] by phone today to assist with re-scheduling a follow up visit with the Pharmacist  Follow up plan: Telephone appointment with care management team member scheduled for: 02/14/2022  Julian Hy, [place] Direct Dial: [phone]

## 2022-02-03 ENCOUNTER — Other Ambulatory Visit (HOSPITAL_COMMUNITY): Payer: Self-pay | Admitting: *Deleted

## 2022-02-03 ENCOUNTER — Other Ambulatory Visit (HOSPITAL_COMMUNITY): Payer: Self-pay | Admitting: Family Medicine

## 2022-02-03 DIAGNOSIS — I5022 Chronic systolic (congestive) heart failure: Secondary | ICD-10-CM

## 2022-02-03 NOTE — Progress Notes (Signed)
Pt left vm stating order was not sent to [facility]. Pt is at [facility] and needs lab order.  Order faxed to [phone]. Order also placed in epic.

## 2022-02-04 LAB — BASIC METABOLIC PANEL
BUN/Creatinine Ratio: 13 (ref 12–28)
BUN: 21 mg/dL (ref 8–27)
CO2: 23 mmol/L (ref 20–29)
Calcium: 8.8 mg/dL (ref 8.7–10.3)
Chloride: 98 mmol/L (ref 96–106)
Creatinine, Ser: 1.6 mg/dL — ABNORMAL HIGH (ref 0.57–1.00)
Glucose: 105 mg/dL — ABNORMAL HIGH (ref 70–99)
Potassium: 4.2 mmol/L (ref 3.5–5.2)
Sodium: 140 mmol/L (ref 134–144)
eGFR: 32 mL/min/{1.73_m2} — ABNORMAL LOW (ref >59)

## 2022-02-06 ENCOUNTER — Encounter: Payer: Self-pay | Admitting: *Deleted

## 2022-02-06 ENCOUNTER — Other Ambulatory Visit (HOSPITAL_COMMUNITY): Payer: Medicare Other

## 2022-02-09 ENCOUNTER — Other Ambulatory Visit (HOSPITAL_COMMUNITY): Payer: Self-pay | Admitting: Cardiology

## 2022-02-14 ENCOUNTER — Ambulatory Visit (INDEPENDENT_AMBULATORY_CARE_PROVIDER_SITE_OTHER): Payer: Medicare Other | Admitting: Pharmacist

## 2022-02-14 DIAGNOSIS — I10 Essential (primary) hypertension: Secondary | ICD-10-CM

## 2022-02-14 DIAGNOSIS — E1165 Type 2 diabetes mellitus with hyperglycemia: Secondary | ICD-10-CM

## 2022-02-14 DIAGNOSIS — I480 Paroxysmal atrial fibrillation: Secondary | ICD-10-CM

## 2022-02-14 DIAGNOSIS — I5020 Unspecified systolic (congestive) heart failure: Secondary | ICD-10-CM

## 2022-02-14 DIAGNOSIS — E785 Hyperlipidemia, unspecified: Secondary | ICD-10-CM

## 2022-02-14 NOTE — Chronic Care Management (AMB) (Signed)
Chronic Care Management Pharmacy Note  02/14/2022 Name:  Heidi Stark MRN:  [id number] DOB:  March 14, 1941  Summary: Patient reports today that she has reached Medicare coverage gap for 2023.  She has been approved for medication assistance program for [name] and Humalog 75/25 Mix  - thru 07/16/2022. She also has recently been approved and received Ozempic 32m from patient assistance program and approved thru 06/15/2022.  Weight has not decreased significantly but blood glucose is much improved. If blood glucose starts to decrease to <90, will consider lowering dose of Humalog to prevent hypoglycemia.  LDL not at goal but continues to improve. - due to have lipids rechecked with next labs.She is taking statin and Vascepa.  Patient is taking amiodarone for atrial fibrillation. Last TSH was elevated and endo office started levothyroxine 510m daily 12/21/2021 Discussed benefits of Continuous Glucose Monitor. Patient will let me know if she wants to try Continuous Glucose Monitor - we can provided sample and education on how to use.   Subjective: Heidi HOVERs an [age].o. year old female who is a primary patient of Heidi Stark.  The CCM team was consulted for assistance with disease management and care coordination needs.    Engaged with patient by telephone for follow up visit in response to provider referral for pharmacy case management and/or care coordination services.   Consent to Services:  The patient was given information about Chronic Care Management services, agreed to services, and gave verbal consent prior to initiation of services.  Please see initial visit note for detailed documentation.   Patient Care Team: Heidi Stark as PCP - General CaConstance HawMD as PCP - Electrophysiology (Cardiology) [name] as Consulting Physician (Ophthalmology) [name] as Consulting Physician (Endocrinology) Heidi Stark RPH-CPP (Pharmacist) Heidi DresserMD as Consulting Physician (Cardiology)  Recent consults:  12/21/2021 - Endo ([facility] BaAlmetta Lovelyffice. started levothyroxine 5050mdaily. A1c checked - was 6.0%. 01/23/2022 - [facility]  Increased furosemide to 57m21mily. BMET today and repeat in 1 week. Repeat ECHO.  12/20/2021 - Cardio - Telephone call. Scr elevated. Recommended hold furosemide 1 day, then decresae to 57mg1m and 40mg 70m Stop metolazone. Recheck BMP in 1 week.  12/20/2021 - Cardio (Dr Heidi DubinCHF. No  med changes.  12/08/2021 - Cardio (Dr CamnitCurt Bearsatrial fibrillation. No med changes.   Objective:  Lab Results  Component Value Date   CREATININE 1.60 (H) 02/03/2022   CREATININE 1.58 (H) 01/23/2022   CREATININE 1.63 (H) 12/27/2021    Lab Results  Component Value Date   HGBA1C 6.0 12/21/2021   Last diabetic Eye exam:  Lab Results  Component Value Date/Time   HMDIABEYEEXA No Retinopathy 08/17/2020 12:00 AM    Last diabetic Foot exam: No results found for: "HMDIABFOOTEX"      Component Value Date/Time   CHOL 186 01/27/2021 1146   CHOL 170 06/02/2019 0913   TRIG 292 (H) 01/27/2021 1146   HDL 34 (L) 01/27/2021 1146   HDL 31 (L) 06/02/2019 0913   CHOLHDL 5.5 01/27/2021 1146   VLDL 58 (H) 01/27/2021 1146   LDLCALC 94 01/27/2021 1146   LDLCALC 92 06/02/2019 0913   LDLCALC 77 06/28/2018 1410   LDLDIRECT 42.0 11/30/2014 1226       Latest Ref Rng & Units 12/20/2021   12:20 PM 06/24/2021   11:21 AM 05/30/2021   11:42  AM  Hepatic Function  Total Protein 6.5 - 8.1 g/dL 7.5  7.4  7.6   Albumin 3.5 - 5.0 g/dL 3.5  4.3  3.6   AST 15 - 41 U/L 65  39  39   ALT 0 - 44 U/L _0 Alk Phosphatase 38 - 126 U/L 38  81  62   Total Bilirubin 0.3 - 1.2 mg/dL 1.5  0.3  0.6     Lab Results  Component Value Date/Time   TSH 7.892 (H) 12/20/2021 12:20 PM   TSH 8.131 (H) 09/16/2021 11:32 AM   TSH 5.510 (H) 12/20/2020 02:17 PM    TSH 1.740 09/09/2018 01:26 PM   FREET4 1.59 (H) 12/20/2021 12:20 PM   FREET4 1.12 05/30/2021 11:23 AM       Latest Ref Rng & Units 04/23/2021    1:17 AM 04/22/2021    1:46 AM 04/21/2021    1:41 AM  CBC  WBC 4.0 - 10.5 K/uL 9.0  8.7  10.1   Hemoglobin 12.0 - 15.0 g/dL 14.1  14.6  14.4   Hematocrit 36.0 - 46.0 % 42.9  45.1  45.1   Platelets 150 - 400 K/uL 240  179  213     Lab Results  Component Value Date/Time   VD25OH 4.3 (L) 09/09/2018 01:26 PM   VD25OH 22 (L) 11/18/2009 08:27 PM    Clinical ASCVD: Yes  The ASCVD Risk score (Arnett DK, et al., 2019) failed to calculate for the following reasons:   The 2019 ASCVD risk score is only valid for ages 33 to 62    Other: CHADS2VASc = 6  Social History   Tobacco Use  Smoking Status Every Day   Packs/day: 1.00   Years: 50.00   Total pack years: 50.00   Types: Cigarettes   Last attempt to quit: 08/17/2020   Years since quitting: 1.4  Smokeless Tobacco Never  Tobacco Comments   Pt started smoking again   BP Readings from Last 3 Encounters:  01/23/22 116/60  12/20/21 (!) 90/50  12/08/21 102/62   Pulse Readings from Last 3 Encounters:  01/23/22 (!) 59  12/20/21 64  12/08/21 61   Wt Readings from Last 3 Encounters:  01/23/22 268 lb 3.2 oz (121.7 kg)  12/20/21 264 lb 9.6 oz (120 kg)  12/08/21 266 lb (120.7 kg)    Assessment: Review of patient past medical history, allergies, medications, health status, including review of consultants reports, laboratory and other test data, was performed as part of comprehensive evaluation and provision of chronic care management services.   SDOH:  (Social Determinants of Health) assessments and interventions performed:     CCM Care Plan  Allergies  Allergen Reactions   Neomycin-Bacitracin Zn-Polymyx Rash   Sulfonamide Derivatives Other (See Comments)    Stomach cramps   Ciprofloxacin Hives, Itching and Rash    Pt doesn't remember having any reaction to cipro   Spironolactone  Rash    Medications Reviewed Today     Reviewed by Cherre Stark, PharmD, CLINICAL PHARMACIST PRACTITIONER 09/14/2021 Med List Status: <None>   Medication Order Taking? Sig Documenting Provider Last Dose Status Informant  acetaminophen (TYLENOL) 500 MG tablet [id number] Yes Take 500 mg by mouth every 6 (six) hours as needed for moderate pain or headache. [provider] Taking Active Self  albuterol (VENTOLIN HFA) 108 (90 Base) MCG/ACT inhaler [id number] Yes Inhale 2 puffs into the lungs every 4 (four) hours as needed for wheezing or shortness  of breath. Geradine Girt, DO Taking Active   amiodarone (PACERONE) 200 MG tablet [id number] Yes Take 1 tablet (200 mg total) by mouth daily. Shirley Friar, PA-C Taking Active   apixaban (ELIQUIS) 5 MG TABS tablet [id number] Yes Take 1 tablet (5 mg total) by mouth 2 (two) times daily. Lyda Jester M, PA-C Taking Active Self  eplerenone (INSPRA) 25 MG tablet [id number] Yes TAKE 1 TABLET (25 MG TOTAL) BY MOUTH DAILY. Larey Dresser, MD Taking Active Self  FARXIGA 10 MG TABS tablet [id number] Yes TAKE 1 TABLET BY MOUTH EVERY DAY BEFORE BREAKFAST Larey Dresser, MD Taking Active            Med Note Jonathon Jordan Aug 25, 2021 11:27 AM) Patient [place] patient [place] program thru 07/16/2022  fenofibrate (TRICOR) 145 MG tablet [id number] Yes TAKE 1 TABLET BY MOUTH EVERY DAY Larey Dresser, MD Taking Active   furosemide (LASIX) 40 MG tablet [id number] Yes Take 2 tablets (80 mg total) by mouth 2 (two) times daily. Larey Dresser, MD Taking Active Self  HUMALOG MIX 75/25 KWIKPEN (75-25) 100 UNIT/ML KwikPen [id number] Yes Inject 30-40 Units into the skin See admin instructions. Take 40 units in the AM, then take 30 units in the afternoon per patient [provider] Taking Active Self  icosapent Ethyl (VASCEPA) 1 g capsule [id number] Yes TAKE 2 CAPSULES BY MOUTH TWICE A DAY Larey Dresser, MD Taking Active    KLOR-CON M10 10 MEQ tablet [id number] Yes TAKE 2 TABLETS BY MOUTH 2 TIMES DAILY. Larey Dresser, MD Taking Active   LIVALO 4 MG TABS [id number] Yes TAKE 1 TABLET BY MOUTH EVERY DAY Larey Dresser, MD Taking Active   metoprolol succinate (TOPROL XL) 25 MG 24 hr tablet [id number] Yes Take 1 tablet (25 mg total) by mouth daily. Larey Dresser, MD Taking Active   mexiletine (MEXITIL) 250 MG capsule [id number] Yes Take 1 capsule (250 mg total) by mouth 2 (two) times daily. Heidi Haw, MD Taking Active   Multiple Vitamin (MULTIVITAMIN WITH MINERALS) TABS tablet [id number] Yes Take 1 tablet by mouth daily. [provider] Taking Active Self  rOPINIRole (REQUIP) 0.25 MG tablet [id number] Yes Take 2 tablets (0.5 mg total) by mouth at bedtime. [name], Kendrick Fries R, DO Taking Active   sacubitril-valsartan (ENTRESTO) 49-51 MG [id number] Yes Take 1 tablet by mouth 2 (two) times daily. Larey Dresser, MD Taking Active Self           Med Note Antony Contras, [place] Sep 14, 2021  3:11 PM) Approved to receive from medication assistance program thru 07/16/2022  Semaglutide, 1 MG/DOSE, (OZEMPIC, 1 MG/DOSE,) 4 MG/3ML SOPN [id number] Yes Inject 1 mg into the skin once a week. Heidi Haw, MD Taking Active            Med Note Carlena Bjornstad, Tiney Rouge   Thu Sep 01, 2021 11:00 AM)              Patient Active Problem List   Diagnosis Date Noted   Diabetic nephropathy ([place]) 10/13/2021   Hyperglycemia due to type 2 diabetes mellitus ([place]) 10/13/2021   Proteinuria 10/13/2021   Pure hypercholesterolemia 10/13/2021   COVID-19 04/15/2021   Respiratory failure with hypoxia ([place]) 81/84/0375   Systolic CHF ([place]) 43/60/6770   CKD (chronic kidney disease) stage 3, GFR 30-59 ml/min (HCC) 04/15/2021   Elevated troponin 04/15/2021   Acute on  chronic systolic (congestive) heart failure ([place]) 09/14/2020   Wide-complex tachycardia 09/07/2020   Ventricular tachyarrhythmia ([place]) 09/07/2020   VT  (ventricular tachycardia) ([place]) 09/07/2020   Persistent atrial fibrillation ([place]) 04/30/2018   Wound of left leg 10/01/2017   Cellulitis of right lower extremity 11/27/2016   Encounter for assessment for deep vein thrombosis (DVT) 11/14/2016   Hypokalemia 08/13/2016   Type 2 diabetes mellitus with hyperglycemia, with long-term current use of insulin (HCC)    Atherosclerosis of aorta ([place]) 06/26/2016   Colitis 11/24/2014   Severe obesity (BMI >= 40) (HCC) 11/12/2013   PAF (paroxysmal atrial fibrillation) ([place]) 11/03/2013   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 10/28/2013   COLONIC POLYPS 08/16/2010   PAD (peripheral artery disease) ([place]) 08/15/2010   HLD (hyperlipidemia) 11/18/2009   MYALGIA 11/18/2009   OBESITY 05/14/2007   PULMONARY NODULE, RIGHT MIDDLE LOBE 05/14/2007   Obstructive sleep apnea 12/06/2006   Essential hypertension 12/06/2006   COPD (chronic obstructive pulmonary disease) ([place]) 12/06/2006    Immunization History  Administered Date(s) Administered   Influenza Whole 04/28/2009, 04/27/2010   Influenza, High Dose Seasonal PF 04/24/2014   Influenza-Unspecified 04/17/2015   Pneumococcal Conjugate-13 10/11/2015   Pneumococcal Polysaccharide-23 07/17/2002, 11/29/2011   Td 07/17/2002, 06/28/2018   Zoster, Live 11/11/2008    Conditions to be addressed/monitored: Atrial Fibrillation, CHF, CAD, HTN, HLD, Hypertriglyceridemia, COPD, and DMII  Care Plan : General Pharmacy (Adult)  Updates made by Cherre Stark, RPH-CPP since 02/14/2022 12:00 AM     Problem: HTN; CHF; atrial fibrillation, Hyperlipidemia; type 2 DM, insulin dependent;   Priority: High  Onset Date: 05/11/2021     Long-Range Goal: Provide education, support and care coordination for medication therapy and chronic conditions   Start Date: 05/11/2021  Expected End Date: 05/11/2022  Recent Progress: On track  Priority: High  Note:   Current Barriers:  Unable to  independently afford treatment regimen Unable to achieve control of type 2 diabetes  Unable to maintain control of hypertension and hyperlipidemia   Pharmacist Clinical Goal(s):  Over the next 180 days, patient will verbalize ability to afford treatment regimen achieve control of type 2 diabetes as evidenced by A1c <8.0% (to start, will adjust goal as needed) maintain control of hypertension and hyperlipidemia  as evidenced by blood pressure <140/90 and LDL <70  through collaboration with PharmD and provider.   Interventions: 1:1 collaboration with [name], [name], DO regarding development and update of comprehensive plan of care as evidenced by provider attestation and co-signature Inter-disciplinary care team collaboration (see longitudinal plan of care) Comprehensive medication review performed; medication list updated in electronic medical record  Diabetes: Controlled; A1c goal <7.5% if able without increase in hypoglycemia Lab Results  Component Value Date   HGBA1C 6.0 12/21/2021  Managed by endocrinologist - Dr [name] (last visit 12/21/2021 with prior authorization) Current treatment: Humalog 75/25 Mix - inject 40 units each morning and 30 units each evening Farxiga 37m daily before breakfast Ozempic 127minjected subcutaneously once weekly Past treatments: Invokamet; Lantus; Tried Ozempic 75m71mut caused nausea - able to tolerate 1mg35mse. Ozempic was initiated by Dr McLeAundra Stark DM, CHD and weight loss.   Wt Readings from Last 3 Encounters:  01/23/22 268 lb 3.2 oz (121.7 kg)  12/20/21 264 lb 9.6 oz (120 kg)  12/08/21 266 lb (120.7 kg)  Current glucose readings:  AM: 110 to 140's PM: 150 to 160's Denies hypoglycemic/hyperglycemic symptoms (no blood glucose readings <100) Patient has been  approved for Farxiga and Humalog patient assistance program thru 07/16/2022  Also approved and received Ozempic from patient assistance program and approved thru 06/15/2022.      Interventions:  Reviewed signs and symptoms of hypoglycemia and how to treat.  If blood glucose starts to decrease to <90, will consider lowering dose of Humalog to prevent hypoglycemia.  Reviewed diet and discussed ways to lower intake of sugar and carbohydrates. (Addressed at previous visit)  Discussed possibly  using Continuous Glucose Monitor - patient will let me know if she wants to try. Reviewed home blood glucose readings and reviewed goals  Fasting blood glucose goal (before meals) = 80 to 130 Blood glucose goal after a meal = less than 180   Hypertension / CHF: Controlled CHF  managed by Dr Aundra Stark with [facility] Current treatment: Farxiga 78m daily  Entresto 49-529m- take 1 tablet twice a day Inspra / Eplerenone 2570mnce a day Furosemide 51m8mtake 2 tablets = 80mg74mce a day Potassium 10 mEq - take 2 tablets twice a day  Metoprolol ER 25mg 32my (Restarted 05/30/2021 - in past had hypotension with beta blockers) Current home readings: Patient could not recall exact numbers and did not have record but states blood pressure never >140/90;  BNP (last 3 results) Recent Labs    04/23/21 0117 09/16/21 1138 01/23/22 1148  BNP 61.7 73.9 47.9    ProBNP (last 3 results) Recent Labs    09/08/21 1053 12/06/21 1225  PROBNP 144 542  EF = 55% (12/2017)  due to have ECHO 02/2022 Approved for FarxigWilder Gladentresto patient assistance program thru 07/16/2022.   Interventions:  Reviewed blood pressure medication and blood pressure goals.  Continue to follow up with Heart Failure Clinic  Hyperlipidemia: Uncontrolled; LDL goal <70 Lab Results  Component Value Date   CHOL 186 01/27/2021   HDL 34 (L) 01/27/2021   LDLCALC 94 01/27/2021   LDLDIRECT 42.0 11/30/2014   TRIG 292 (H) 01/27/2021   CHOLHDL 5.5 01/27/2021  Current treatment: Livalo 4mg da1m  Fenofibrate 145mg da81mVascepa / icosapent ethyl 1 gram - take 2 capsules twice a  day Medications previously tried: pravastatin and rosuvastatin  Interventions:  Discussed LDL goals Screened for patient assistance for Livalo but patient's income did not meet program limit. Continue current medications for lipid lowering. Recommend if LDL still >70 after next lipid recheck, consider adding ezetimibe 10mg dai105m Atrial Fibrillation: Controlled;  current rate/rhythm control:   Amiodarone 100mg dail43mtakeing 0.5 tablet of 200mg daily33mse lowered 05/2021 per Dr McLean) MexAundra Dubine 250mg daily 53mcoagulant treatment:  Eliquis 5mg twice a 44m CHADS2VASc score: 6 TSH was slightly elevated at 8.131 when checked 09/16/2021. Endocrinology office started levothyroxine 50mcg daily i9mne 2023. TSH still elevated in July 2023 and dose was increased to 100mcg MWF and 53mg all other25ms.  LFTS were WNL Patient reports she has had eye exam - 06/2021 so net due 06/2022 Screened at previous visit for Eliquis patient assistance program but patient was about $100 over income limit. Intervention:  Reminded patient to monitor for signs and symptoms of bleeding Continue to monitor LFTs and thyroid function and get annual eye exam while taking amiodarone.    Restless Leg Syndrome:  Improved; goal: decrease symptoms of leg pain / movement  Current therapy:  Ropinirole 0.25mg - take 1 ta42m at bedtime for 3 to 4 nights, then increase to 2 tablets at night Started ropinirole 06/2021. Patient reports leg  pain has improved significantly She endorses that she is only taking ropinirole as needed Intervention:  Reviewed directions for ropinirole with patient. Ok to take as needed if this is working for her but also discussed that she can take every night at bedtime.    Medication management Pharmacist Clinical Goal(s): Over the next 90 days, patient will work with PharmD and providers to maintain optimal medication adherence Current pharmacy: [facility] Comprehensive  medication review performed. Reviewed refill history and assessed adherence Continue current medication management strategy  Patient Goals/Self-Care Activities Over the next 180 days, patient will:  take medications as prescribed,  check blood pressure 3 to 4 times per week, document, and provide at future appointments,  Weigh daily, and contact provider if weight gain of more than 3 lbs in 24 hours or 5 lbs in 1 week, and collaborate with provider on medication access solutions Make appointment with PCP - patient would like to see if she could do video visit. Given number for scheduler for Dr [name] to discuss.  Recheck lipids / cholesterol with next labs Limit intake of sugar and carbohydrates.   Follow Up Plan: Telephone follow up appointment with care management team member scheduled for:  3 months         Medication Assistance:She has been approved to receive Farxiga 43m thru [place] patient [place] program thru 07/16/2022. Approved 08/30/2021 to received Humalog Mix insulin thru 07/16/2022 and also [facility]  Patient did not qualify for patient assistance program for Livalo due to income restrictions.  Checked Ozempic patient assistance program. Per automatic system application has not been processed due to needing additional information. I was not able to determine additional information needed since cardiology office initiated application.  Sent message to clinical pharmacist at cardiology office that was assisting with application to let them know that addition information was needed to process application.  Patient's preferred pharmacy is:  [id number] [address]: [phone]: [id number] [address] [place]: [phone]:  [phone] Up:  Patient agrees to Care Plan and Follow-up.  Plan: Telephone follow up appointment with care management team member scheduled for:  3 months  TamCherre RobinsharmD Clinical Pharmacist [facility]Va [facility]

## 2022-02-14 NOTE — Patient Instructions (Signed)
Heidi Stark I was so glad to speak with you today.  Below is a summary of your health goals and our recent visit. You can also view your update Chronic Care Management Care plan through your MyChart account.   Patient Goals/Self-Care Activities take medications as prescribed,  check blood pressure 3 to 4 times per week, document, and provide at future appointments. Let me know if you want to try Continuous Glucose Monitor - we can provided sample and education on how to use.  weigh daily, and contact provider if weight gain of more than 3 lbs in 24 hours or 5 lbs in 1 week, and collaborate with provider on medication access solutions Make appointment with PCP and endocrinologist Recheck lipids / cholesterol with next labs Limit intake of sugar and carbohydrates.    As always if you have any questions or concerns especially regarding medications, please feel free to contact me either at the phone number below or with a MyChart message.   Keep up the good work!  [name], PharmD Clinical Pharmacist [place] [phone] (direct line)  [phone] (main office number)   Patient verbalizes understanding of instructions and care plan provided today and agrees to view in [place]. Active MyChart status and patient understanding of how to access instructions and care plan via MyChart confirmed with patient.

## 2022-02-17 ENCOUNTER — Telehealth (INDEPENDENT_AMBULATORY_CARE_PROVIDER_SITE_OTHER): Payer: Medicare Other | Admitting: Family Medicine

## 2022-02-17 ENCOUNTER — Encounter: Payer: Self-pay | Admitting: Family Medicine

## 2022-02-17 DIAGNOSIS — E7800 Pure hypercholesterolemia, unspecified: Secondary | ICD-10-CM

## 2022-02-17 DIAGNOSIS — I10 Essential (primary) hypertension: Secondary | ICD-10-CM

## 2022-02-17 DIAGNOSIS — I5020 Unspecified systolic (congestive) heart failure: Secondary | ICD-10-CM

## 2022-02-17 DIAGNOSIS — Z794 Long term (current) use of insulin: Secondary | ICD-10-CM

## 2022-02-17 DIAGNOSIS — E1165 Type 2 diabetes mellitus with hyperglycemia: Secondary | ICD-10-CM | POA: Diagnosis not present

## 2022-02-17 DIAGNOSIS — I4819 Other persistent atrial fibrillation: Secondary | ICD-10-CM

## 2022-02-17 NOTE — Progress Notes (Addendum)
MyChart Video Visit    Virtual Visit via Video Note   This visit type was conducted due to national recommendations for restrictions regarding the COVID-19 Pandemic (e.g. social distancing) in an effort to limit this patient's exposure and mitigate transmission in our community. This patient is at least at moderate risk for complications without adequate follow up. This format is felt to be most appropriate for this patient at this time. Physical exam was limited by quality of the video and audio technology used for the visit. [name] was able to get the patient set up on a video visit.  Patient location: home with husband  Patient and provider in visit Provider location: Office  I discussed the limitations of evaluation and management by telemedicine and the availability of in person appointments. The patient expressed understanding and agreed to proceed.  Visit Date: 02/17/2022  Today's healthcare provider: Ann Held, DO     Subjective:    Patient ID: Heidi Stark, female    DOB: 04/26/1941, 81 y.o.   MRN: [id number]  No chief complaint on file.   HPI Patient is in today for virtual follow up visit.   She had to be called by phone due to technical difficulties.   She overall has been doing well, and plans to have her thyroid checked with Dr [name].    She checks her blood pressure regularly. Her blood pressure yesterday was 110/70 mmHg, and her pulse was 70 bpm. Her oxygen level was 97 spO2.   She plans to have her echocardiogram August 9th 2023. Of note, she is not permitted to drive for six months.  Past Medical History:  Diagnosis Date   A-fib [facility])    Arthritis    "hands, wrists" (11/07/2016)   CHF (congestive heart failure) (HCC)    Chronic lower back pain    Constipation    COPD (chronic obstructive pulmonary disease) ([place])    DVT (deep venous thrombosis) (HCC)    Dyspnea    History of blood transfusion 1990   "related to OR"   History  of kidney stones    Hyperlipidemia    Hypertension    Joint pain    Lower extremity edema    OSA on CPAP    Persistent atrial fibrillation ([place])    Pneumonia    "several times" (11/07/2016)   Type II diabetes mellitus ([place])     Past Surgical History:  Procedure Laterality Date   ATRIAL FIBRILLATION ABLATION N/A 11/07/2016   Procedure: Atrial Fibrillation Ablation;  Surgeon: [name], MD;  Location: [place] [facility];  Service: Cardiovascular;  Laterality: N/A;   BACK SURGERY     CARDIAC CATHETERIZATION     CARDIOVERSION N/A 08/11/2016   Procedure: CARDIOVERSION;  Surgeon: Larey Dresser, MD;  Location: [place];  Service: Cardiovascular;  Laterality: N/A;   CARDIOVERSION N/A 12/03/2017   Procedure: CARDIOVERSION;  Surgeon: Larey Dresser, MD;  Location: [facility] ENDOSCOPY;  Service: Cardiovascular;  Laterality: N/A;   CARDIOVERSION N/A 05/02/2018   Procedure: CARDIOVERSION;  Surgeon: Pixie Casino, MD;  Location: [facility] ENDOSCOPY;  Service: Cardiovascular;  Laterality: N/A;   CARDIOVERSION N/A 06/23/2020   Procedure: CARDIOVERSION;  Surgeon: [name], MD;  Location: [facility] ENDOSCOPY;  Service: Cardiovascular;  Laterality: N/A;   CARDIOVERSION N/A 10/11/2020   Procedure: CARDIOVERSION;  Surgeon: Larey Dresser, MD;  Location: [place];  Service: Cardiovascular;  Laterality: N/A;   CARPAL TUNNEL RELEASE  CATARACT EXTRACTION W/ INTRAOCULAR LENS  IMPLANT, BILATERAL Bilateral    COLON SURGERY  1990   vein graft and colon repair after nicked artery with back surgert   COLONOSCOPY     CORONARY ANGIOGRAPHY N/A 09/08/2020   Procedure: CORONARY ANGIOGRAPHY;  Surgeon: Larey Dresser, MD;  Location: [place] [facility];  Service: Cardiovascular;  Laterality: N/A;   CYSTECTOMY     between bladder and kidneys   GANGLION CYST EXCISION Left    ICD IMPLANT N/A 09/15/2020   Procedure: ICD IMPLANT;  Surgeon: Constance Haw, MD;  Location: [place] [facility];  Service:  Cardiovascular;  Laterality: N/A;   KNEE CARTILAGE SURGERY Right 1960s   LUMBAR [place] ILIAC ARTERY  1990   vein graft and colon repair after nicked artery with back surgert   TEE WITHOUT CARDIOVERSION N/A 10/31/2013   Procedure: TRANSESOPHAGEAL ECHOCARDIOGRAM (TEE);  Surgeon: Larey Dresser, MD;  Location: [facility] ENDOSCOPY;  Service: Cardiovascular;  Laterality: N/A;   TUBAL LIGATION      Family History  Problem Relation Age of Onset   Diabetes Mother    Hypertension Mother    Cancer Mother    Obesity Mother    Diabetes Father    Obesity Father    Diabetes Other    Melanoma Other    Factor V Leiden deficiency Daughter     Social History   Socioeconomic History   Marital status: Married    Spouse name: [name]   Number of children: 2   Years of education: Not on file   Highest education level: Not on file  Occupational History   Occupation: Retired  Tobacco Use   Smoking status: Every Day    Packs/day: 1.00    Years: 50.00    Total pack years: 50.00    Types: Cigarettes    Last attempt to quit: 08/17/2020    Years since quitting: 1.5   Smokeless tobacco: Never   Tobacco comments:    Pt started smoking again  Vaping Use   Vaping Use: Never used  Substance and Sexual Activity   Alcohol use: No    Alcohol/week: 0.0 standard drinks of alcohol   Drug use: No   Sexual activity: Not on file  Other Topics Concern   Not on file  Social History Narrative   Married x 67 years.    1 son and 1 daughter.   1 grandson.   Social Determinants of Health   Financial Resource Strain: Medium Risk (10/13/2021)   Overall Financial Resource Strain (CARDIA)    Difficulty of Paying Living Expenses: Somewhat hard  Food Insecurity: No Food Insecurity (10/13/2021)   Hunger Vital Sign    Worried About Running Out of Food in the Last Year: Never true    Ran Out of Food in the Last Year: Never true  Transportation Needs: No Transportation Needs (10/13/2021)   PRAPARE -  Hydrologist (Medical): No    Lack of Transportation (Non-Medical): No  Physical Activity: Insufficiently Active (10/13/2021)   Exercise Vital Sign    Days of Exercise per Week: 2 days    Minutes of Exercise per Session: 20 min  Stress: No Stress Concern Present (10/13/2021)   [place]    Feeling of Stress : Not at all  Social Connections: [place] (10/13/2021)   Social Connection and Isolation Panel [NHANES]    Frequency of Communication  with Friends and Family: More than three times a week    Frequency of Social Gatherings with Friends and Family: More than three times a week    Attends Religious Services: 1 to 4 times per year    Active Member of [employer] or Organizations: No    Attends Music therapist: 1 to 4 times per year    Marital Status: Married  Human resources officer Violence: Not At Risk (10/13/2021)   Humiliation, Afraid, Rape, and Kick questionnaire    Fear of Current or Ex-Partner: No    Emotionally Abused: No    Physically Abused: No    Sexually Abused: No    Outpatient Medications Prior to Visit  Medication Sig Dispense Refill   acetaminophen (TYLENOL) 500 MG tablet Take 500 mg by mouth every 6 (six) hours as needed for moderate pain or headache.     amiodarone (PACERONE) 200 MG tablet Take 1 tablet (200 mg total) by mouth daily. 90 tablet 3   BD PEN NEEDLE NANO 2ND GEN 32G X 4 MM MISC See admin instructions.     ELIQUIS 5 MG TABS tablet TAKE 1 TABLET BY MOUTH TWICE A DAY 60 tablet 3   eplerenone (INSPRA) 25 MG tablet TAKE 1 TABLET (25 MG TOTAL) BY MOUTH DAILY. 90 tablet 1   FARXIGA 10 MG TABS tablet TAKE 1 TABLET BY MOUTH EVERY DAY BEFORE BREAKFAST 30 tablet 6   fenofibrate (TRICOR) 145 MG tablet TAKE 1 TABLET BY MOUTH EVERY DAY 90 tablet 3   furosemide (LASIX) 40 MG tablet Take 2 tablets (80 mg total) by mouth 2 (two) times daily. 120 tablet 3   HUMALOG  MIX 75/25 KWIKPEN (75-25) 100 UNIT/ML KwikPen Inject 30-40 Units into the skin See admin instructions. Take 40 units in the AM, then take 30 units in the afternoon per patient     icosapent Ethyl (VASCEPA) 1 g capsule TAKE 2 CAPSULES BY MOUTH TWICE A DAY 120 capsule 5   KLOR-CON M10 10 MEQ tablet TAKE 2 TABLETS BY MOUTH 2 TIMES DAILY. 360 tablet 3   levothyroxine (SYNTHROID) 50 MCG tablet Taking 2  tablets = 136mg Mondays, Wednesdays and Fridays. 1 tablet all other days.     LIVALO 4 MG TABS TAKE 1 TABLET BY MOUTH EVERY DAY 90 tablet 3   metoprolol succinate (TOPROL-XL) 25 MG 24 hr tablet TAKE 1 TABLET BY MOUTH EVERYDAY AT BEDTIME 90 tablet 3   mexiletine (MEXITIL) 250 MG capsule TAKE 1 CAPSULE BY MOUTH 2 TIMES DAILY. 180 capsule 3   Multiple Vitamin (MULTIVITAMIN WITH MINERALS) TABS tablet Take 1 tablet by mouth daily.     ONETOUCH VERIO test strip 1 each 3 (three) times daily.     rOPINIRole (REQUIP) 0.25 MG tablet TAKE 2 TABLETS BY MOUTH AT BEDTIME 180 tablet 0   sacubitril-valsartan (ENTRESTO) 49-51 MG Take 1 tablet by mouth 2 (two) times daily. 60 tablet 3   Semaglutide, 1 MG/DOSE, (OZEMPIC, 1 MG/DOSE,) 4 MG/3ML SOPN Inject 1 mg into the skin once a week.     ENTRESTO 24-26 MG TAKE 1 TABLET BY MOUTH TWICE A DAY 180 tablet 3   albuterol (VENTOLIN HFA) 108 (90 Base) MCG/ACT inhaler Inhale 2 puffs into the lungs every 4 (four) hours as needed for wheezing or shortness of breath. (Patient not taking: Reported on 02/14/2022) 8 g 0   No facility-administered medications prior to visit.    Allergies  Allergen Reactions   Neomycin-Bacitracin Zn-Polymyx Rash   Sulfonamide Derivatives  Other (See Comments)    Stomach cramps   Ciprofloxacin Hives, Itching and Rash    Pt doesn't remember having any reaction to cipro   Spironolactone Rash    Review of Systems  Constitutional:  Negative for fever and malaise/fatigue.  HENT:  Negative for congestion.   Eyes:  Negative for blurred vision.   Respiratory:  Negative for cough and shortness of breath.   Cardiovascular:  Negative for chest pain, palpitations and leg swelling.  Gastrointestinal:  Negative for vomiting.  Musculoskeletal:  Negative for back pain.  Skin:  Negative for rash.  Neurological:  Negative for loss of consciousness and headaches.       Objective:    Physical Exam Vitals and nursing note reviewed.  Neck:     Vascular: No JVD.  Neurological:     Mental Status: She is alert and oriented to person, place, and time.     BP 110/70   Pulse 70   SpO2 97%  Wt Readings from Last 3 Encounters:  02/22/22 272 lb (123.4 kg)  01/23/22 268 lb 3.2 oz (121.7 kg)  12/20/21 264 lb 9.6 oz (120 kg)    Diabetic Foot Exam - Simple   No data filed    Lab Results  Component Value Date   WBC 10.9 (H) 02/22/2022   HGB 14.2 02/22/2022   HCT 43.8 02/22/2022   PLT 222 02/22/2022   GLUCOSE 78 02/22/2022   CHOL 186 01/27/2021   TRIG 292 (H) 01/27/2021   HDL 34 (L) 01/27/2021   LDLDIRECT 42.0 11/30/2014   LDLCALC 94 01/27/2021   ALT 30 12/20/2021   AST 65 (H) 12/20/2021   NA 137 02/22/2022   K 4.0 02/22/2022   CL 99 02/22/2022   CREATININE 1.66 (H) 02/22/2022   BUN 26 (H) 02/22/2022   CO2 27 02/22/2022   TSH 7.892 (H) 12/20/2021   INR 1.3 (H) 09/13/2020   HGBA1C 6.0 12/21/2021   MICROALBUR 12.8 10/26/2020    Lab Results  Component Value Date   TSH 7.892 (H) 12/20/2021   Lab Results  Component Value Date   WBC 10.9 (H) 02/22/2022   HGB 14.2 02/22/2022   HCT 43.8 02/22/2022   MCV 97.1 02/22/2022   PLT 222 02/22/2022   Lab Results  Component Value Date   NA 137 02/22/2022   K 4.0 02/22/2022   CO2 27 02/22/2022   GLUCOSE 78 02/22/2022   BUN 26 (H) 02/22/2022   CREATININE 1.66 (H) 02/22/2022   BILITOT 1.5 (H) 12/20/2021   ALKPHOS 38 12/20/2021   AST 65 (H) 12/20/2021   ALT 30 12/20/2021   PROT 7.5 12/20/2021   ALBUMIN 3.5 12/20/2021   CALCIUM 8.3 (L) 02/22/2022   ANIONGAP 11 02/22/2022    EGFR 32 (L) 02/03/2022   GFR 112.55 10/11/2015   Lab Results  Component Value Date   CHOL 186 01/27/2021   Lab Results  Component Value Date   HDL 34 (L) 01/27/2021   Lab Results  Component Value Date   LDLCALC 94 01/27/2021   Lab Results  Component Value Date   TRIG 292 (H) 01/27/2021   Lab Results  Component Value Date   CHOLHDL 5.5 01/27/2021   Lab Results  Component Value Date   HGBA1C 6.0 12/21/2021       Assessment & Plan:   Problem List Items Addressed This Visit       Unprioritized   Essential hypertension (Chronic)    Well controlled, no changes to meds. Encouraged  heart healthy diet such as the DASH diet and exercise as tolerated.       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       Persistent atrial fibrillation ([place])    Per card         No orders of the defined types were placed in this encounter.   I discussed the assessment and treatment plan with the patient. The patient was provided an opportunity to ask questions and all were answered. The patient agreed with the plan and demonstrated an understanding of the instructions.   The patient was advised to call back or seek an in-person evaluation if the symptoms worsen or if the condition fails to improve as anticipated.  I provided 20 minutes of face-to-face time during this encounter.   I,Tinashe Williams,acting as a Education administrator for [employer], DO.,have documented all relevant documentation on the behalf of Ann Held, DO,as directed by  Ann Held, DO while in the presence of Ann Held, DO.   Ann Held,  DO [place] [facility] [employer] [phone] (phone) [phone] (fax)  [place]

## 2022-02-21 ENCOUNTER — Other Ambulatory Visit (HOSPITAL_COMMUNITY): Payer: Self-pay | Admitting: Family Medicine

## 2022-02-22 ENCOUNTER — Ambulatory Visit (HOSPITAL_COMMUNITY)
Admission: RE | Admit: 2022-02-22 | Discharge: 2022-02-22 | Disposition: A | Discharge status: Home / Self Care | Payer: Medicare Other | Source: Ambulatory Visit | Attending: Adult Health | Admitting: Adult Health

## 2022-02-22 ENCOUNTER — Ambulatory Visit (HOSPITAL_BASED_OUTPATIENT_CLINIC_OR_DEPARTMENT_OTHER)
Admission: RE | Admit: 2022-02-22 | Discharge: 2022-02-22 | Disposition: A | Discharge status: Home / Self Care | Payer: Medicare Other | Source: Ambulatory Visit | Attending: Adult Health | Admitting: Adult Health

## 2022-02-22 VITALS — BP 106/60 | HR 60 | Wt 272.0 lb

## 2022-02-22 DIAGNOSIS — I5032 Chronic diastolic (congestive) heart failure: Secondary | ICD-10-CM | POA: Diagnosis not present

## 2022-02-22 DIAGNOSIS — Z8616 Personal history of COVID-19: Secondary | ICD-10-CM | POA: Diagnosis not present

## 2022-02-22 DIAGNOSIS — I480 Paroxysmal atrial fibrillation: Secondary | ICD-10-CM | POA: Diagnosis not present

## 2022-02-22 DIAGNOSIS — R946 Abnormal results of thyroid function studies: Secondary | ICD-10-CM | POA: Insufficient documentation

## 2022-02-22 DIAGNOSIS — L03116 Cellulitis of left lower limb: Secondary | ICD-10-CM

## 2022-02-22 DIAGNOSIS — E039 Hypothyroidism, unspecified: Secondary | ICD-10-CM | POA: Insufficient documentation

## 2022-02-22 DIAGNOSIS — Z7901 Long term (current) use of anticoagulants: Secondary | ICD-10-CM | POA: Diagnosis not present

## 2022-02-22 DIAGNOSIS — Z79899 Other long term (current) drug therapy: Secondary | ICD-10-CM | POA: Diagnosis not present

## 2022-02-22 DIAGNOSIS — I2510 Atherosclerotic heart disease of native coronary artery without angina pectoris: Secondary | ICD-10-CM | POA: Diagnosis not present

## 2022-02-22 DIAGNOSIS — I5042 Chronic combined systolic (congestive) and diastolic (congestive) heart failure: Secondary | ICD-10-CM | POA: Insufficient documentation

## 2022-02-22 DIAGNOSIS — Z6841 Body Mass Index (BMI) 40.0 and over, adult: Secondary | ICD-10-CM | POA: Insufficient documentation

## 2022-02-22 DIAGNOSIS — G4733 Obstructive sleep apnea (adult) (pediatric): Secondary | ICD-10-CM | POA: Diagnosis not present

## 2022-02-22 DIAGNOSIS — E1151 Type 2 diabetes mellitus with diabetic peripheral angiopathy without gangrene: Secondary | ICD-10-CM | POA: Diagnosis not present

## 2022-02-22 DIAGNOSIS — R55 Syncope and collapse: Secondary | ICD-10-CM | POA: Insufficient documentation

## 2022-02-22 DIAGNOSIS — E785 Hyperlipidemia, unspecified: Secondary | ICD-10-CM | POA: Diagnosis not present

## 2022-02-22 DIAGNOSIS — I5022 Chronic systolic (congestive) heart failure: Secondary | ICD-10-CM

## 2022-02-22 DIAGNOSIS — Z7984 Long term (current) use of oral hypoglycemic drugs: Secondary | ICD-10-CM | POA: Diagnosis not present

## 2022-02-22 DIAGNOSIS — Z95810 Presence of automatic (implantable) cardiac defibrillator: Secondary | ICD-10-CM | POA: Diagnosis not present

## 2022-02-22 DIAGNOSIS — I484 Atypical atrial flutter: Secondary | ICD-10-CM | POA: Insufficient documentation

## 2022-02-22 DIAGNOSIS — E669 Obesity, unspecified: Secondary | ICD-10-CM | POA: Diagnosis not present

## 2022-02-22 DIAGNOSIS — F17210 Nicotine dependence, cigarettes, uncomplicated: Secondary | ICD-10-CM | POA: Insufficient documentation

## 2022-02-22 DIAGNOSIS — J449 Chronic obstructive pulmonary disease, unspecified: Secondary | ICD-10-CM | POA: Insufficient documentation

## 2022-02-22 DIAGNOSIS — I110 Hypertensive heart disease with heart failure: Secondary | ICD-10-CM | POA: Diagnosis not present

## 2022-02-22 LAB — CBC
HCT: 43.8 % (ref 36.0–46.0)
Hemoglobin: 14.2 g/dL (ref 12.0–15.0)
MCH: 31.5 pg (ref 26.0–34.0)
MCHC: 32.4 g/dL (ref 30.0–36.0)
MCV: 97.1 fL (ref 80.0–100.0)
Platelets: 222 10*3/uL (ref 150–400)
RBC: 4.51 MIL/uL (ref 3.87–5.11)
RDW: 15.1 % (ref 11.5–15.5)
WBC: 10.9 10*3/uL — ABNORMAL HIGH (ref 4.0–10.5)
nRBC: 0 % (ref 0.0–0.2)

## 2022-02-22 LAB — BASIC METABOLIC PANEL
Anion gap: 11 (ref 5–15)
BUN: 26 mg/dL — ABNORMAL HIGH (ref 8–23)
CO2: 27 mmol/L (ref 22–32)
Calcium: 8.3 mg/dL — ABNORMAL LOW (ref 8.9–10.3)
Chloride: 99 mmol/L (ref 98–111)
Creatinine, Ser: 1.66 mg/dL — ABNORMAL HIGH (ref 0.44–1.00)
GFR, Estimated: 31 mL/min — ABNORMAL LOW (ref >60)
Glucose, Bld: 78 mg/dL (ref 70–99)
Potassium: 4 mmol/L (ref 3.5–5.1)
Sodium: 137 mmol/L (ref 135–145)

## 2022-02-22 LAB — BRAIN NATRIURETIC PEPTIDE: B Natriuretic Peptide: 75.9 pg/mL (ref 0.0–100.0)

## 2022-02-22 MED ORDER — DOXYCYCLINE HYCLATE 100 MG PO CAPS: 100.0000 mg | ORAL_CAPSULE | Freq: Two times a day (BID) | ORAL | 0 refills | Status: AC

## 2022-02-22 NOTE — Patient Instructions (Signed)
Medication Changes:  START Doxycycline 100 mg Twice daily FOR 7 DAYS  Lab Work:  Labs done today, your results will be available in MyChart, we will contact you for abnormal readings.  Testing/Procedures:  none  Referrals:  none  Special Instructions // Education:  Do the following things EVERYDAY: Weigh yourself in the morning before breakfast. Write it down and keep it in a log. Take your medicines as prescribed Eat low salt foods--Limit salt (sodium) to 2000 mg per day.  Stay as active as you can everyday Limit all fluids for the day to less than 2 liters   Follow-Up in: 3 months with Dr [name]  At the [facility], you and your health needs are our priority. We have a designated team specialized in the treatment of Heart Failure. This Care Team includes your primary Heart Failure Specialized Cardiologist (physician), Advanced Practice Providers (APPs- Physician Assistants and Nurse Practitioners), and Pharmacist who all work together to provide you with the care you need, when you need it.   You may see any of the following providers on your designated Care Team at your next follow up:  Dr Glori Bickers Dr Haynes Kerns, NP Lyda Jester, [place] [facility] [place], [place] Audry Riles, PharmD   Please be sure to bring in all your medications bottles to every appointment.   Need to Contact us:  If you have any questions or concerns before your next appointment please send [name] a message through [place] or call our office at [phone].    TO LEAVE A MESSAGE FOR THE NURSE SELECT OPTION 2, PLEASE LEAVE A MESSAGE INCLUDING: YOUR NAME DATE OF BIRTH CALL BACK NUMBER REASON FOR CALL**this is important as we prioritize the call backs  YOU WILL RECEIVE A CALL BACK THE SAME DAY AS LONG AS YOU CALL BEFORE 4:00 PM

## 2022-02-22 NOTE — Progress Notes (Signed)
Advanced Heart Failure Clinic Note   PCP:  Ann Held, DO  HF Cardiologist: Dr. [name]  HPI: Heidi Stark is a 81 y.o. female who has history of HTN, DM, hyperlipidemia.  She was admitted in 4/15 with splenic and renal infarcts.  She was found to have paroxysmal atrial fibrillation.  In 4/15, she had fever and flank pain.  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  TEE showed prominent aortic plaque.  While she was in the hospital, she was noted to have a brief run of atrial fibrillation and was started on coumadin.     She has left leg pain with ambulation after about 100 feet, LE dopplers 5/15 showed occluded left external iliac artery. This has been present ever since she had left iliac damage with a prior back surgery.     She was stable until 1/18, when she developed palpitations and exertional dyspnea.  She was admitted to [facility] with atrial fibrillation/RVR and acute systolic CHF.  EF was found to be 30-35%, down from 50-55% in 2015.  She was diuresed and underwent DCCV back to NSR.  She subsequently had atrial fibrillation ablation in 4/18.  She went back into atrial fibrillation in 5/19 and had DCCV back today NSR.    Echo was done 6/19 and reviewed, EF 55%, mild LVH, moderate diastolic dysfunction, normal RV size and systolic function.    With recurrent atrial fibrillation, she was admitted in 10/19 to start Tikosyn.  She was cardioverted back to NSR.   She had a recent admission in 2/22 with lightheadedness/presyncope that appeared to correlate with runs of VT.  She was on Tikosyn for atrial fibrillation though VT not thought to be due to Tikosyn (not polymorphic, QT not prolonged).  Tikosyn was stopped and amiodarone was started.  Echo in 2/22 showed EF 50-55% but cMRI (2/22) showed EF 40% with subendocardial scar in the basal to mid inferolateral wall, RV EF was 38%.  She had coronary angiography showing no obstructive disease.   VT subsided and she was sent home on amiodarone.    09/13/20, she had 3 episodes of presyncope in succession while sitting at a desk.  No prodrome, each episode of severe lightheadedness lasted maybe 30 seconds.  She did not pass out. Because of these events, she came to the ER. She was admitted for observation and amiodarone gtt was restarted.  She had no VT overnight.  She remained in atypical atrial flutter today (was in atypical flutter at last admission). She had a single chamber Medtronic ICD placed 09/15/20. Discharge weight 256 lbs.   She had post hospital f/u on 09/22/20 and was noted to be back in atrial flutter w/ CVR. She was set up for repeat DCCV 3 weeks later. She underwent DCCV on 10/11/20 and converted back to NSR.   She had COVID-19 in 9/22.   Follow up 09/01/21, she was volume overloaded and instructed to take metolazone 2.5 + extra 40 KCL x 2 days.  Multiple episodes of VT on 09/06/21, received ATP but no shocks. Follow up with EP 09/08/21 and started on mexiletine 250 bid.  On 11/28/21, she had VT with ATP and 1 ICD shock.  She had not taken amiodarone or mexiletine for a couple of days when this occurred.  She saw Dr. [name], meds were not changed.   Follow up 6/23, stable NYHA II, volume ok. Labs showed creatinine elevated at 2.2,  instructed to hold Lasix x 1 days, then decrease to 80/40. Metolazone was also stopped.  Today she returns for HF follow up. 3 weeks ago lasix increased due to lower impedance and elevated fluid index. Overall feeling tired. Complaining of fatigue. Complaining of LLE redness and pain. Limited functionally due to achilles injury. Denies SOB/PND/Orthopnea. Appetite ok. No fever or chills. Weight at home 272 pounds.She has been weighing weekly. Taking all medications.   MDT: Impedance at baseline. Optivol well below fluid index.No A fib  No VT. Activity < 1 hour.   Labs (4/15): K 4.1, creatinine 0.7, HCT 46.2 Labs (7/15): K 3.9, creatinine 0.8, HCT 50 Labs  (12/15): K 5, creatinine 0.7, HDL 33, LDL 57 Labs (5/16): K 4, creatinine 0.84, TGs 256, HDL 29, LDL 42, HCT 41.2 Labs (1/18): K 3.4, creatinine 0.78 Labs (2/18): K 4.4, creatinine 0.8, free T3/T4 normal, mildly elevated TSH, LFTs normal, hgb 15.6 Labs (4/18): K 4.2, creatinine 0.86, TSH elevated, free T3 and free T4 normal Labs (5/19): K 3.9, creatinine 0.78 Labs (6/19): K 4.4, creatinine 0.88 Labs (12/19): K 4.2, creatinine 0.85, TGs 322, LDL 77 Labs (2/20): LDL 46, TGs 362 Labs (3/20): K 4.7, creatinine 1.07 Labs (11/20): TGs 282, LDL 92 Labs (3/22): K 4.1, creatinine 1.23 Labs (4/22): K 4.2, creatinine 1.2, LFTs normal, TSH mildly elevated, free T4 mildly elevated, normal T3.  Labs (6/22): TSH mildly elevated, free T3 and T4 normal Labs (7/22): TGs 292, LDL 94 Labs (10/22): K 3.8, creatinine 1.04, BNP 62, hgb 14.1, LFTs normal Labs (2/23): K 3.5, creatinine 1.75, mag 2.5 Labs (3/23): TSH 8.31 Labs (5/23): BNP 542, K 3.8, creatinine 1.7 Labs (6/23): K 4.1, creatinine 1.63, AST 65, T Bili 1.5, TSH mildly elevated,, free T4 mildly elevated, free T3 normal  PMH: 1.Type II diabetes 2. HTN 3. Hyperlipidemia 4. COPD: has quit smoking.  5. CAD: Coronary CTA (4/18) with nonobstructive coronary disease, calcium score 335 Agatston units (82nd percentile).  - Coronary angiography (2/22) with no obstructive disease 6. Paroxysmal atrial fibrillation: First noted in 4/15.  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   - Event monitor (4/15) with no atrial fibrillation.  - Admission with atrial fibrillation/RVR in 1/18.  - Atrial fibrillation ablation in 4/18.  - DCCV in 5/19.  - Tikosyn started + DCCV in 10/19. VT => Tikosyn stopped and amiodarone begun in 3/22.   - S/p single chamber MDT ICD 09/15/20 - DCCV to NSR in 3/22.  7. Chronic systolic CHF: ?tachycardia-mediated as first noted in setting of atrial fibrillation with RVR.  - TEE (4/15) with EF  50-55%, mild LVH, grade III-IV plaque in descending thoracic aorta, RV normal, no PFO, peak RV-RA gradient 42 mmHg.   - Echo (1/18) with EF 30-35%, diffuse hypokinesis, moderate LVH, mildly dilated RV, PASP 55 mmHg.  - Echo (4/18) with EF 50%, anterolateral/inferolateral mild hypokinesis, mild aortic stenosis, IVC normal.  - Echo (6/19) with EF 55%, mild LVH, moderate diastolic dysfunction, normal RV size and systolic function.  - cMRI (2/22) with EF 40% with subendocardial scar in the basal to mid inferolateral wall, RV EF was 38%. - Echo (2/22) with EF 50-55%, normal RV.  7. H/o diskectomy. 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 Lower extremity arterial dopplers (5/15) with occluded left external iliac artery.  - ABIs (5/18): 1.16 (normal) on right, 0.56 on left.  9. Aortic stenosis: Mild on 4/18 echo.  10. OSA: Uses CPAP  11. VT: 2/22.  No significant CAD on cath in 2/22.  cMRI with EF 40% with subendocardial scar in the basal to mid inferolateral wall, RV EF was 38%. - Medtronic ICD placed 3/22.  12. COVID-19 9/22  Current Outpatient Medications  Medication Sig Dispense Refill   acetaminophen (TYLENOL) 500 MG tablet Take 500 mg by mouth every 6 (six) hours as needed for moderate pain or headache.     amiodarone (PACERONE) 200 MG tablet Take 1 tablet (200 mg total) by mouth daily. 90 tablet 3   BD PEN NEEDLE NANO 2ND GEN 32G X 4 MM MISC See admin instructions.     ELIQUIS 5 MG TABS tablet TAKE 1 TABLET BY MOUTH TWICE A DAY 60 tablet 3   eplerenone (INSPRA) 25 MG tablet TAKE 1 TABLET (25 MG TOTAL) BY MOUTH DAILY. 90 tablet 1   FARXIGA 10 MG TABS tablet TAKE 1 TABLET BY MOUTH EVERY DAY BEFORE BREAKFAST 30 tablet 6   fenofibrate (TRICOR) 145 MG tablet TAKE 1 TABLET BY MOUTH EVERY DAY 90 tablet 3   furosemide (LASIX) 40 MG tablet Take 2 tablets (80 mg total) by mouth 2 (two) times daily. 120 tablet 3   HUMALOG MIX 75/25 KWIKPEN (75-25) 100 UNIT/ML KwikPen Inject 30-40  Units into the skin See admin instructions. Take 40 units in the AM, then take 30 units in the afternoon per patient     icosapent Ethyl (VASCEPA) 1 g capsule TAKE 2 CAPSULES BY MOUTH TWICE A DAY 120 capsule 5   KLOR-CON M10 10 MEQ tablet TAKE 2 TABLETS BY MOUTH 2 TIMES DAILY. 360 tablet 3   levothyroxine (SYNTHROID) 50 MCG tablet Taking 2  tablets = 1109mg Mondays, Wednesdays and Fridays. 1 tablet all other days.     LIVALO 4 MG TABS TAKE 1 TABLET BY MOUTH EVERY DAY 90 tablet 3   metoprolol succinate (TOPROL-XL) 25 MG 24 hr tablet TAKE 1 TABLET BY MOUTH EVERYDAY AT BEDTIME 90 tablet 3   mexiletine (MEXITIL) 250 MG capsule TAKE 1 CAPSULE BY MOUTH 2 TIMES DAILY. 180 capsule 3   Multiple Vitamin (MULTIVITAMIN WITH MINERALS) TABS tablet Take 1 tablet by mouth daily.     ONETOUCH VERIO test strip 1 each 3 (three) times daily.     rOPINIRole (REQUIP) 0.25 MG tablet TAKE 2 TABLETS BY MOUTH AT BEDTIME 180 tablet 0   sacubitril-valsartan (ENTRESTO) 49-51 MG Take 1 tablet by mouth 2 (two) times daily. 60 tablet 3   Semaglutide, 1 MG/DOSE, (OZEMPIC, 1 MG/DOSE,) 4 MG/3ML SOPN Inject 1 mg into the skin once a week.     No current facility-administered medications for this encounter.    Allergies:   Neomycin-bacitracin zn-polymyx, Sulfonamide derivatives, Ciprofloxacin, and Spironolactone   Social History:  The patient  reports that she has been smoking cigarettes. She has a 50.00 pack-year smoking history. She has never used smokeless tobacco. She reports that she does not drink alcohol and does not use drugs.   Family History:  The patient's family history includes Cancer in her mother; Diabetes in her father, mother, and another family member; Factor V Leiden deficiency in her daughter; Hypertension in her mother; Melanoma in an other family member; Obesity in her father and mother.   ROS:  Please see the history of present illness.   All other systems are personally reviewed and negative.   BP 106/60    Pulse 60   Wt 123.4 kg (272 lb)   SpO2 94%   BMI 43.90  kg/m   Wt Readings from Last 3 Encounters:  02/22/22 123.4 kg (272 lb)  01/23/22 121.7 kg (268 lb 3.2 oz)  12/20/21 120 kg (264 lb 9.6 oz)   PHYSICAL EXAM: General:  Arrived in a wheelchair. No resp difficulty HEENT: normal Neck: supple. no JVD. Carotids 2+ bilat; no bruits. No lymphadenopathy or thryomegaly appreciated. Cor: PMI nondisplaced. Regular rate & rhythm. No rubs, gallops or murmurs. Lungs: clear Abdomen: soft, nontender, nondistended. No hepatosplenomegaly. No bruits or masses. Good bowel sounds. Extremities: no cyanosis, clubbing, rash, edema. LLE erythema with small scab no open wounds.  Neuro: alert & orientedx3, cranial nerves grossly intact. moves all 4 extremities w/o difficulty. Affect pleasant  Recent Labs: 04/23/2021: Hemoglobin 14.1; Platelets 240 12/06/2021: Magnesium 2.3; NT-Pro BNP 542 12/20/2021: ALT 30; TSH 7.892 01/23/2022: B Natriuretic Peptide 47.9 02/03/2022: BUN 21; Creatinine, Ser 1.60; Potassium 4.2; Sodium 140  Personally reviewed   ASSESSMENT AND PLAN: 1. H/o VT: noted 08/2020. 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 Had been on Tikosyn but this was not the typical Tikosyn-induced polymorphic VT and she had had no changes in Tikosyn dosing prior to occurrence. cMRI showed subendocardial LGE in the basal to mid inferolateral wall. This appeared to be a coronary disease pattern however coronary angiogram did not show significant coronary disease.  She was transitioned from Tikosyn to amiodarone and underwent Medtronic single chamber ICD on 09/15/20. VT on 09/06/21 and was started on mexiletine.  VT again on 11/28/21 after missing amiodarone and mexiletine.  - Continue amiodarone 200 mg daily, LFTs ok, mildly elevated TSH and free T4 (6/23).  She will need a regular eye exam.  Endocrinology managing hypothyroidism.  -  Continue mexiletine. 2. Atrial fibrillation/flutter: Paroxysmal.  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 She was admitted in 1/18 with atrial fibrillation/RVR and CHF.  She was diuresed and had DCCV back to NSR. She had atrial fibrillation ablation in 4/18.  In 5/19, she went into atrial fibrillation transiently.  In 10/19, she was back in atrial fibrillation and was admitted for Tikosyn initiation and cardioversion.  Atypical atrial flutter noted in 12/21 with DCCV to NSR. As outlined above, now off Tikosyn and on amiodarone. Underwent repeat DCCV in 3/22.   -Regular on exam.  - Continue amiodarone 200 mg daily.  - Continue Eliquis 5 mg bid.  3. Claudication: Left leg claudication with absent left PT pulse.  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   4. Chronic systolic => diastolic CHF: EF 05-39% on echo in 1/18, down from 50-55% in 4/15.  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 In 2/22, cMRI showed LV EF 40% with RV E 38%, inferolateral subendocardial LGE, but echo in 2/22 showed EF 50-55%.   -NYHA II. VOlume status stable.  -Optivol- fluid well below threshhold. - ContinueLasix to 80 mg bid.  - Continue Toprol XL 25 mg daily.   - Continue Entresto 49/51 mg bid. - Continue eplerenone 25 mg daily.  - Continue dapagliflozin 10 mg daily.  - Discussed low salt food choices and limiting fluid intake to < 2 liters per day.  - Repeat echo has been arranged.  5. CAD: Coronary CTA in 2018 showed coronary calcium score in the 82nd percentile with nonobstructive coronary disease. Cath in 2/22 showed only luminal irregularities. No chest pain.  -  Not on ASA due to Eliquis.  - Continue Vascepa and Livalo.  6. COPD:  History of smoking. Quit 2/22.  7. OSA: Continue CPAP nightly. CPAP wont turn off. She has been unplugging then restarting.   She has f/u with Pulmonary.  8. Obesity: Body  mass index is 43.9 kg/m. - On semaglutide. 9. Hypothyroidsim Endocrinologist managing.  10. LLE cellulitis Add doxycycline 100 mg twice a day x 7 days. If no improvement would  Check CBC.   Check CBC/BMET/BNP   Follow up with Dr [name] in 3 months   Signed, Darrick Grinder, NP  02/22/2022  [place] [phone] (office) [phone] (fax)

## 2022-02-23 LAB — ECHOCARDIOGRAM COMPLETE
AR max vel: 1.3 cm2
AV Peak grad: 14.6 mmHg
Ao pk vel: 1.91 m/s
Area-P 1/2: 3.13 cm2
S' Lateral: 3.15 cm

## 2022-02-25 NOTE — Assessment & Plan Note (Signed)
Per card

## 2022-02-25 NOTE — Assessment & Plan Note (Signed)
Per dr [name]

## 2022-02-25 NOTE — Assessment & Plan Note (Signed)
Well controlled, no changes to meds. Encouraged heart healthy diet such as the DASH diet and exercise as tolerated.  °

## 2022-02-25 NOTE — Assessment & Plan Note (Signed)
Per cardiology 

## 2022-02-25 NOTE — Assessment & Plan Note (Signed)
Encourage heart healthy diet such as MIND or DASH diet, increase exercise, avoid trans fats, simple carbohydrates and processed foods, consider a krill or fish or flaxseed oil cap daily.  °

## 2022-02-27 ENCOUNTER — Other Ambulatory Visit: Payer: Self-pay | Admitting: Cardiology

## 2022-02-28 ENCOUNTER — Telehealth: Payer: Medicare Other

## 2022-03-09 ENCOUNTER — Encounter: Payer: Medicare Other | Admitting: Physician Assistant

## 2022-03-14 ENCOUNTER — Encounter: Payer: Self-pay | Admitting: Pulmonary Disease

## 2022-03-14 ENCOUNTER — Ambulatory Visit (INDEPENDENT_AMBULATORY_CARE_PROVIDER_SITE_OTHER): Payer: Medicare Other | Admitting: Pulmonary Disease

## 2022-03-14 VITALS — BP 80/60 | HR 59 | Ht 66.0 in | Wt 268.0 lb

## 2022-03-14 DIAGNOSIS — G4733 Obstructive sleep apnea (adult) (pediatric): Secondary | ICD-10-CM | POA: Diagnosis not present

## 2022-03-14 NOTE — Progress Notes (Signed)
[facility]  Chief Complaint  Patient presents with   Consult    Consult: CPAP compliance     Past Surgical History:  She  has a past surgical history that includes Tubal ligation; Cystectomy; Ganglion cyst excision (Left); TEE without cardioversion (N/A, 10/31/2013); Colonoscopy; Cardioversion (N/A, 08/11/2016); ATRIAL FIBRILLATION ABLATION (N/A, 11/07/2016); Knee cartilage surgery (Right, 1960s); Lumbar disc surgery (1990); Repair iliac artery (1990); Colon surgery (1990); Back surgery; Cataract extraction w/ intraocular lens  implant, bilateral (Bilateral); Cardiac catheterization; Cardioversion (N/A, 12/03/2017); Cardioversion (N/A, 05/02/2018); Carpal tunnel release; Cardioversion (N/A, 06/23/2020); CORONARY ANGIOGRAPHY (N/A, 09/08/2020); ICD IMPLANT (N/A, 09/15/2020); and Cardioversion (N/A, 10/11/2020).  Past Medical History:  Permanent A fib, Diastolic CHF, Back pain, DVT, Nephrolithiasis, HLD, HTN, PNA, DM type 2  Constitutional:  BP (!) 80/60 (BP Location: Left Arm)   Pulse (!) 59   Ht '5\' 6"'$  (1.676 m)   Wt 268 lb (121.6 kg)   SpO2 94%   BMI 43.26 kg/m   Brief Summary:  Heidi Stark is a 81 y.o. female former smoker with obstructive sleep apnea.      Subjective:   She saw Dr. Lamonte Sakai years ago.  She was diagnosed with sleep apnea.  She still has her Resmed S9 CPAP and uses this faithfully.  It is unclear which provider was managing this.  She is having trouble with turning the machine on.  When she sleeps with CPAP her sleep is good quality and she feels rested during the day.  She goes to sleep at 10 pm.  She falls asleep 10 minutes.  She wakes up 1 or 2 times to use the bathroom.  She gets out of bed at 530 am.  She feels rested in the morning.  She denies morning headache.  She does not use anything to help her fall sleep or stay awake.  She denies sleep walking, sleep talking, bruxism, or nightmares.  There is no history of restless  legs.  She denies sleep hallucinations, sleep paralysis, or cataplexy.  The Epworth score is 10 out of 24.   Physical Exam:   Appearance - well kempt, sitting in a wheelchair  ENMT - no sinus tenderness, no oral exudate, no LAN, Mallampati 3 airway, no stridor  Respiratory - equal breath sounds bilaterally, no wheezing or rales  CV - s1s2 regular rate and rhythm, no murmurs  Ext - no clubbing, no edema  Skin - no rashes  Psych - normal mood and affect   Sleep Tests:    Cardiac Tests:  Echo 02/22/22 >> EF 60 to 65%, mod LVH, grade 2 DD, RVSP 33 mmHg, mod LA dilation, aortic root 38 mm  Social History:  She  reports that she quit smoking about 18 months ago. Her smoking use included cigarettes. She has a 50.00 pack-year smoking history. She has never used smokeless tobacco. She reports that she does not drink alcohol and does not use drugs.  Family History:  Her family history includes Cancer in her mother; Diabetes in her father, mother, and another family member; Factor V Leiden deficiency in her daughter; Hypertension in her mother; Melanoma in an other family member; Obesity in her father and mother.     Assessment/Plan:   Obstructive sleep apnea. - she is compliant with CPAP and reports benefit from therapy - she uses [employer] for her DME - her current CPAP is ancient - will try to arrange for new Resmed auto CPAP  - explained she  might need a new home sleep study before insurance will cover a new CPAP machine  Time Spent Involved in Patient Care on Day of Examination:  37 minutes  Follow up:   Patient Instructions  Will arrange for new auto CPAP machine through [facility]  Follow up in 4 months  Medication List:   Allergies as of 03/14/2022       Reactions   Neomycin-bacitracin Zn-polymyx Rash   Sulfonamide Derivatives Other (See Comments)   Stomach cramps   Sulfa Antibiotics Other (See Comments)   Ciprofloxacin Hives, Itching, Rash   Pt doesn't remember  having any reaction to cipro   Spironolactone Rash        Medication List        Accurate as of March 14, 2022 11:38 AM. If you have any questions, ask your nurse or doctor.          acetaminophen 500 MG tablet Commonly known as: TYLENOL Take 500 mg by mouth every 6 (six) hours as needed for moderate pain or headache.   amiodarone 200 MG tablet Commonly known as: PACERONE Take 1 tablet (200 mg total) by mouth daily.   BD Pen Needle Nano 2nd Gen 32G X 4 MM Misc Generic drug: Insulin Pen Needle See admin instructions.   Eliquis 5 MG Tabs tablet Generic drug: apixaban TAKE 1 TABLET BY MOUTH TWICE A DAY   Entresto 49-51 MG Generic drug: sacubitril-valsartan Take 1 tablet by mouth 2 (two) times daily.   eplerenone 25 MG tablet Commonly known as: INSPRA TAKE 1 TABLET BY MOUTH EVERY DAY   Farxiga 10 MG Tabs tablet Generic drug: dapagliflozin propanediol TAKE 1 TABLET BY MOUTH EVERY DAY BEFORE BREAKFAST   fenofibrate 145 MG tablet Commonly known as: TRICOR TAKE 1 TABLET BY MOUTH EVERY DAY   furosemide 40 MG tablet Commonly known as: LASIX Take 2 tablets (80 mg total) by mouth 2 (two) times daily.   HumaLOG Mix 75/25 KwikPen (75-25) 100 UNIT/ML Kwikpen Generic drug: Insulin Lispro Prot & Lispro Inject 30-40 Units into the skin See admin instructions. Take 40 units in the AM, then take 30 units in the afternoon per patient   icosapent Ethyl 1 g capsule Commonly known as: VASCEPA TAKE 2 CAPSULES BY MOUTH TWICE A DAY   Klor-Con M10 10 MEQ tablet Generic drug: potassium chloride TAKE 2 TABLETS BY MOUTH 2 TIMES DAILY.   levothyroxine 50 MCG tablet Commonly known as: SYNTHROID Taking 2  tablets = 147mg Mondays, Wednesdays and Fridays. 1 tablet all other days.   Livalo 4 MG Tabs Generic drug: Pitavastatin Calcium TAKE 1 TABLET BY MOUTH EVERY DAY   metoprolol succinate 25 MG 24 hr tablet Commonly known as: TOPROL-XL TAKE 1 TABLET BY MOUTH EVERYDAY AT  BEDTIME   mexiletine 250 MG capsule Commonly known as: MEXITIL TAKE 1 CAPSULE BY MOUTH 2 TIMES DAILY.   multivitamin with minerals Tabs tablet Take 1 tablet by mouth daily.   OneTouch Verio test strip Generic drug: glucose blood 1 each 3 (three) times daily.   Ozempic (1 MG/DOSE) 4 MG/3ML Sopn Generic drug: Semaglutide (1 MG/DOSE) Inject 1 mg into the skin once a week.   rOPINIRole 0.25 MG tablet Commonly known as: REQUIP TAKE 2 TABLETS BY MOUTH AT BEDTIME        Signature:  VChesley Mires MD LPoint PleasantPager - [phone]/29/2023, 11:38 AM

## 2022-03-14 NOTE — Patient Instructions (Signed)
Will arrange for new auto CPAP machine through [facility]  Follow up in 4 months

## 2022-03-16 ENCOUNTER — Ambulatory Visit (INDEPENDENT_AMBULATORY_CARE_PROVIDER_SITE_OTHER): Payer: Medicare Other

## 2022-03-16 DIAGNOSIS — I4891 Unspecified atrial fibrillation: Secondary | ICD-10-CM | POA: Diagnosis not present

## 2022-03-16 DIAGNOSIS — E785 Hyperlipidemia, unspecified: Secondary | ICD-10-CM | POA: Diagnosis not present

## 2022-03-16 DIAGNOSIS — I509 Heart failure, unspecified: Secondary | ICD-10-CM

## 2022-03-16 DIAGNOSIS — Z95810 Presence of automatic (implantable) cardiac defibrillator: Secondary | ICD-10-CM

## 2022-03-16 DIAGNOSIS — Z794 Long term (current) use of insulin: Secondary | ICD-10-CM

## 2022-03-16 DIAGNOSIS — E1159 Type 2 diabetes mellitus with other circulatory complications: Secondary | ICD-10-CM

## 2022-03-16 DIAGNOSIS — I110 Hypertensive heart disease with heart failure: Secondary | ICD-10-CM | POA: Diagnosis not present

## 2022-03-20 LAB — CUP PACEART REMOTE DEVICE CHECK
Battery Remaining Longevity: 126 mo
Battery Voltage: 3.03 V
Brady Statistic RV Percent Paced: 0.35 %
Date Time Interrogation Session: 20230831033329
HighPow Impedance: 96 Ohm
Implantable Lead Implant Date: 20220302
Implantable Lead Location: 753860
Implantable Pulse Generator Implant Date: 20220302
Lead Channel Impedance Value: 627 Ohm
Lead Channel Impedance Value: 703 Ohm
Lead Channel Pacing Threshold Amplitude: 2.5 V
Lead Channel Pacing Threshold Pulse Width: 0.4 ms
Lead Channel Sensing Intrinsic Amplitude: 8 mV
Lead Channel Sensing Intrinsic Amplitude: 8 mV
Lead Channel Setting Pacing Amplitude: 5 V
Lead Channel Setting Pacing Pulse Width: 1 ms
Lead Channel Setting Sensing Sensitivity: 0.3 mV

## 2022-03-21 ENCOUNTER — Encounter: Payer: Self-pay | Admitting: Cardiology

## 2022-03-22 ENCOUNTER — Telehealth: Payer: Self-pay | Admitting: Pharmacist

## 2022-03-22 NOTE — Telephone Encounter (Signed)
Patient made aware that her shipment of Ozempic has arrived from [employer]. She can pick it up at her convenience.

## 2022-04-06 NOTE — Progress Notes (Signed)
Remote ICD transmission.   

## 2022-04-13 ENCOUNTER — Other Ambulatory Visit: Payer: Self-pay | Admitting: Family Medicine

## 2022-04-22 ENCOUNTER — Other Ambulatory Visit: Payer: Self-pay | Admitting: Cardiology

## 2022-04-23 ENCOUNTER — Other Ambulatory Visit (HOSPITAL_COMMUNITY): Payer: Self-pay | Admitting: Family Medicine

## 2022-05-09 ENCOUNTER — Ambulatory Visit: Payer: Medicare Other | Admitting: Primary Care

## 2022-05-26 ENCOUNTER — Encounter (HOSPITAL_COMMUNITY): Payer: Self-pay | Admitting: Cardiology

## 2022-05-26 ENCOUNTER — Ambulatory Visit (HOSPITAL_COMMUNITY)
Admission: RE | Admit: 2022-05-26 | Discharge: 2022-05-26 | Disposition: A | Discharge status: Home / Self Care | Payer: Medicare Other | Source: Ambulatory Visit | Attending: Cardiology | Admitting: Cardiology

## 2022-05-26 VITALS — BP 92/52 | HR 59

## 2022-05-26 DIAGNOSIS — I2510 Atherosclerotic heart disease of native coronary artery without angina pectoris: Secondary | ICD-10-CM | POA: Diagnosis not present

## 2022-05-26 DIAGNOSIS — Z79899 Other long term (current) drug therapy: Secondary | ICD-10-CM | POA: Insufficient documentation

## 2022-05-26 DIAGNOSIS — G8929 Other chronic pain: Secondary | ICD-10-CM | POA: Diagnosis not present

## 2022-05-26 DIAGNOSIS — G4733 Obstructive sleep apnea (adult) (pediatric): Secondary | ICD-10-CM | POA: Insufficient documentation

## 2022-05-26 DIAGNOSIS — R55 Syncope and collapse: Secondary | ICD-10-CM | POA: Diagnosis not present

## 2022-05-26 DIAGNOSIS — Z87891 Personal history of nicotine dependence: Secondary | ICD-10-CM | POA: Insufficient documentation

## 2022-05-26 DIAGNOSIS — M545 Low back pain, unspecified: Secondary | ICD-10-CM | POA: Diagnosis not present

## 2022-05-26 DIAGNOSIS — N183 Chronic kidney disease, stage 3 unspecified: Secondary | ICD-10-CM | POA: Insufficient documentation

## 2022-05-26 DIAGNOSIS — Z8616 Personal history of COVID-19: Secondary | ICD-10-CM | POA: Diagnosis not present

## 2022-05-26 DIAGNOSIS — Z7984 Long term (current) use of oral hypoglycemic drugs: Secondary | ICD-10-CM | POA: Diagnosis not present

## 2022-05-26 DIAGNOSIS — Z7901 Long term (current) use of anticoagulants: Secondary | ICD-10-CM | POA: Insufficient documentation

## 2022-05-26 DIAGNOSIS — E669 Obesity, unspecified: Secondary | ICD-10-CM | POA: Diagnosis not present

## 2022-05-26 DIAGNOSIS — Z794 Long term (current) use of insulin: Secondary | ICD-10-CM | POA: Diagnosis not present

## 2022-05-26 DIAGNOSIS — I130 Hypertensive heart and chronic kidney disease with heart failure and stage 1 through stage 4 chronic kidney disease, or unspecified chronic kidney disease: Secondary | ICD-10-CM | POA: Insufficient documentation

## 2022-05-26 DIAGNOSIS — E1151 Type 2 diabetes mellitus with diabetic peripheral angiopathy without gangrene: Secondary | ICD-10-CM | POA: Insufficient documentation

## 2022-05-26 DIAGNOSIS — J449 Chronic obstructive pulmonary disease, unspecified: Secondary | ICD-10-CM | POA: Insufficient documentation

## 2022-05-26 DIAGNOSIS — I484 Atypical atrial flutter: Secondary | ICD-10-CM | POA: Insufficient documentation

## 2022-05-26 DIAGNOSIS — Z95810 Presence of automatic (implantable) cardiac defibrillator: Secondary | ICD-10-CM | POA: Diagnosis not present

## 2022-05-26 DIAGNOSIS — E1122 Type 2 diabetes mellitus with diabetic chronic kidney disease: Secondary | ICD-10-CM | POA: Diagnosis not present

## 2022-05-26 DIAGNOSIS — I5032 Chronic diastolic (congestive) heart failure: Secondary | ICD-10-CM | POA: Insufficient documentation

## 2022-05-26 DIAGNOSIS — I480 Paroxysmal atrial fibrillation: Secondary | ICD-10-CM | POA: Insufficient documentation

## 2022-05-26 LAB — CBC
HCT: 47.7 % — ABNORMAL HIGH (ref 36.0–46.0)
Hemoglobin: 15 g/dL (ref 12.0–15.0)
MCH: 30.8 pg (ref 26.0–34.0)
MCHC: 31.4 g/dL (ref 30.0–36.0)
MCV: 97.9 fL (ref 80.0–100.0)
Platelets: 191 10*3/uL (ref 150–400)
RBC: 4.87 MIL/uL (ref 3.87–5.11)
RDW: 14.6 % (ref 11.5–15.5)
WBC: 6.4 10*3/uL (ref 4.0–10.5)
nRBC: 0 % (ref 0.0–0.2)

## 2022-05-26 LAB — COMPREHENSIVE METABOLIC PANEL
ALT: 27 U/L (ref 0–44)
AST: 56 U/L — ABNORMAL HIGH (ref 15–41)
Albumin: 3.5 g/dL (ref 3.5–5.0)
Alkaline Phosphatase: 35 U/L — ABNORMAL LOW (ref 38–126)
Anion gap: 11 (ref 5–15)
BUN: 18 mg/dL (ref 8–23)
CO2: 28 mmol/L (ref 22–32)
Calcium: 8.6 mg/dL — ABNORMAL LOW (ref 8.9–10.3)
Chloride: 100 mmol/L (ref 98–111)
Creatinine, Ser: 1.44 mg/dL — ABNORMAL HIGH (ref 0.44–1.00)
GFR, Estimated: 37 mL/min — ABNORMAL LOW (ref >60)
Glucose, Bld: 128 mg/dL — ABNORMAL HIGH (ref 70–99)
Potassium: 3.7 mmol/L (ref 3.5–5.1)
Sodium: 139 mmol/L (ref 135–145)
Total Bilirubin: 0.6 mg/dL (ref 0.3–1.2)
Total Protein: 7.2 g/dL (ref 6.5–8.1)

## 2022-05-26 LAB — BRAIN NATRIURETIC PEPTIDE: B Natriuretic Peptide: 92.6 pg/mL (ref 0.0–100.0)

## 2022-05-26 LAB — TSH: TSH: 3.225 u[IU]/mL (ref 0.350–4.500)

## 2022-05-26 MED ORDER — FUROSEMIDE 40 MG PO TABS: 80.0000 mg | ORAL_TABLET | Freq: Two times a day (BID) | ORAL | 3 refills | Status: DC

## 2022-05-26 NOTE — Patient Instructions (Signed)
There has been no changes to your medications.  Labs done today, your results will be available in MyChart, we will contact you for abnormal readings.  Your physician recommends that you schedule a follow-up appointment in: 4 months   If you have any questions or concerns before your next appointment please send [name] a message through [place] or call our office at [phone].    TO LEAVE A MESSAGE FOR THE NURSE SELECT OPTION 2, PLEASE LEAVE A MESSAGE INCLUDING: YOUR NAME DATE OF BIRTH CALL BACK NUMBER REASON FOR CALL**this is important as we prioritize the call backs  YOU WILL RECEIVE A CALL BACK THE SAME DAY AS LONG AS YOU CALL BEFORE 4:00 PM  At the [facility], you and your health needs are our priority. As part of our continuing mission to provide you with exceptional heart care, we have created designated Provider Care Teams. These Care Teams include your primary Cardiologist (physician) and Advanced Practice Providers (APPs- Physician Assistants and Nurse Practitioners) who all work together to provide you with the care you need, when you need it.   You may see any of the following providers on your designated Care Team at your next follow up: Dr Glori Bickers Dr [name] Dr. Roxana Hires, NP Lyda Jester, [place] [facility] [place], [place] Forestine Na, NP Audry Riles, PharmD   Please be sure to bring in all your medications bottles to every appointment.

## 2022-05-28 NOTE — Progress Notes (Signed)
Advanced Heart Failure Clinic Note   PCP:  Ann Held, DO  HF Cardiologist: Dr. [name]  History of Present Illness: Heidi Stark is a 81 y.o. female who has history of HTN, DM, hyperlipidemia.  She was admitted in 4/15 with splenic and renal infarcts.  She was found to have paroxysmal atrial fibrillation.  In 4/15, she had fever and flank pain.  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  TEE showed prominent aortic plaque.  While she was in the hospital, she was noted to have a brief run of atrial fibrillation and was started on coumadin.     She has left leg pain with ambulation after about 100 feet, LE dopplers 5/15 showed occluded left external iliac artery. This has been present ever since she had left iliac damage with a prior back surgery.     She was stable until 1/18, when she developed palpitations and exertional dyspnea.  She was admitted to [facility] with atrial fibrillation/RVR and acute systolic CHF.  EF was found to be 30-35%, down from 50-55% in 2015.  She was diuresed and underwent DCCV back to NSR.  She subsequently had atrial fibrillation ablation in 4/18.  She went back into atrial fibrillation in 5/19 and had DCCV back today NSR.    Echo was done 6/19 and reviewed, EF 55%, mild LVH, moderate diastolic dysfunction, normal RV size and systolic function.    With recurrent atrial fibrillation, she was admitted in 10/19 to start Tikosyn.  She was cardioverted back to NSR.   She had a recent admission in 2/22 with lightheadedness/presyncope that appeared to correlate with runs of VT.  She was on Tikosyn for atrial fibrillation though VT not thought to be due to Tikosyn (not polymorphic, QT not prolonged).  Tikosyn was stopped and amiodarone was started.  Echo in 2/22 showed EF 50-55% but cMRI (2/22) showed EF 40% with subendocardial scar in the basal to mid inferolateral wall, RV EF was 38%.  She had coronary angiography showing  no obstructive disease.  VT subsided and she was sent home on amiodarone.    09/13/20, she had 3 episodes of presyncope in succession while sitting at a desk.  No prodrome, each episode of severe lightheadedness lasted maybe 30 seconds.  She did not pass out. Because of these events, she came to the ER. She was admitted for observation and amiodarone gtt was restarted.  She had no VT overnight.  She remained in atypical atrial flutter today (was in atypical flutter at last admission). She had a single chamber Medtronic ICD placed 09/15/20. Discharge weight 256 lbs.   She had post hospital f/u on 09/22/20 and was noted to be back in atrial flutter w/ CVR. She was set up for repeat DCCV 3 weeks later. She underwent DCCV on 10/11/20 and converted back to NSR.   She had COVID-19 in 9/22.   Follow up 09/01/21, she was volume overloaded and instructed to take metolazone 2.5 + extra 40 KCL x 2 days.  Multiple episodes of VT on 09/06/21, received ATP but no shocks. Follow up with EP 09/08/21 and started on mexiletine 250 bid.  On 11/28/21, she had VT with ATP and 1 ICD shock.  She had not taken amiodarone or mexiletine for a couple of days when this occurred.  She saw Dr. [name], meds were not changed.   Echo in 8/23 showed EF 60-65%, moderate LVH, normal RV, normal  IVC.   Today she returns for HF followup. She continues to be limited by low back pain.  She does very little walking, just around the house.  No dyspnea walking around the house. No chest pain.    MDT device interrogation: No VT/AF, stable thoracic impedance   ECG (personally reviewed): NSR, 1st degree AVB 280 msec, LAFB, RBBB  Labs (4/15): K 4.1, creatinine 0.7, HCT 46.2 Labs (7/15): K 3.9, creatinine 0.8, HCT 50 Labs (12/15): K 5, creatinine 0.7, HDL 33, LDL 57 Labs (5/16): K 4, creatinine 0.84, TGs 256, HDL 29, LDL 42, HCT 41.2 Labs (1/18): K 3.4, creatinine 0.78 Labs (2/18): K 4.4, creatinine 0.8, free T3/T4 normal, mildly elevated TSH,  LFTs normal, hgb 15.6 Labs (4/18): K 4.2, creatinine 0.86, TSH elevated, free T3 and free T4 normal Labs (5/19): K 3.9, creatinine 0.78 Labs (6/19): K 4.4, creatinine 0.88 Labs (12/19): K 4.2, creatinine 0.85, TGs 322, LDL 77 Labs (2/20): LDL 46, TGs 362 Labs (3/20): K 4.7, creatinine 1.07 Labs (11/20): TGs 282, LDL 92 Labs (3/22): K 4.1, creatinine 1.23 Labs (4/22): K 4.2, creatinine 1.2, LFTs normal, TSH mildly elevated, free T4 mildly elevated, normal T3.  Labs (6/22): TSH mildly elevated, free T3 and T4 normal Labs (7/22): TGs 292, LDL 94 Labs (10/22): K 3.8, creatinine 1.04, BNP 62, hgb 14.1, LFTs normal Labs (2/23): K 3.5, creatinine 1.75, mag 2.5 Labs (3/23): TSH 8.31 Labs (5/23): BNP 542, K 3.8, creatinine 1.7 Labs (8/23): hgb 14.2, K 4, creatinine 1.66  PMH: 1.Type II diabetes 2. HTN 3. Hyperlipidemia 4. COPD: has quit smoking.  5. CAD: Coronary CTA (4/18) with nonobstructive coronary disease, calcium score 335 Agatston units (82nd percentile).  - Coronary angiography (2/22) with no obstructive disease 6. Paroxysmal atrial fibrillation: First noted in 4/15.  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   - Event monitor (4/15) with no atrial fibrillation.  - Admission with atrial fibrillation/RVR in 1/18.  - Atrial fibrillation ablation in 4/18.  - DCCV in 5/19.  - Tikosyn started + DCCV in 10/19. VT => Tikosyn stopped and amiodarone begun in 3/22.   - S/p single chamber MDT ICD 09/15/20 - DCCV to NSR in 3/22.  7. Chronic systolic CHF: ?tachycardia-mediated as first noted in setting of atrial fibrillation with RVR.  - TEE (4/15) with EF 50-55%, mild LVH, grade III-IV plaque in descending thoracic aorta, RV normal, no PFO, peak RV-RA gradient 42 mmHg.   - Echo (1/18) with EF 30-35%, diffuse hypokinesis, moderate LVH, mildly dilated RV, PASP 55 mmHg.  - Echo (4/18) with EF 50%, anterolateral/inferolateral mild hypokinesis, mild aortic  stenosis, IVC normal.  - Echo (6/19) with EF 55%, mild LVH, moderate diastolic dysfunction, normal RV size and systolic function.  - cMRI (2/22) with EF 40% with subendocardial scar in the basal to mid inferolateral wall, RV EF was 38%. - Echo (2/22) with EF 50-55%, normal RV.  - Echo (8/23): EF 60-65%, moderate LVH, normal RV, normal IVC.  7. H/o diskectomy. 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 Lower extremity arterial dopplers (5/15) with occluded left external iliac artery.  - ABIs (5/18): 1.16 (normal) on right, 0.56 on left.  9. Aortic stenosis: Mild on 4/18 echo.  10. OSA: Uses CPAP 11. VT: 2/22.  No significant CAD on cath in 2/22.  cMRI with EF 40% with subendocardial scar in the basal to mid inferolateral wall, RV EF was 38%. - Medtronic ICD placed  3/22.  12. COVID-19 9/22  Current Outpatient Medications  Medication Sig Dispense Refill   acetaminophen (TYLENOL) 500 MG tablet Take 500 mg by mouth every 6 (six) hours as needed for moderate pain or headache.     amiodarone (PACERONE) 200 MG tablet Take 1 tablet (200 mg total) by mouth daily. 90 tablet 3   BD PEN NEEDLE NANO 2ND GEN 32G X 4 MM MISC See admin instructions.     ELIQUIS 5 MG TABS tablet TAKE 1 TABLET BY MOUTH TWICE A DAY 60 tablet 3   eplerenone (INSPRA) 25 MG tablet TAKE 1 TABLET BY MOUTH EVERY DAY 90 tablet 3   FARXIGA 10 MG TABS tablet TAKE 1 TABLET BY MOUTH EVERY DAY BEFORE BREAKFAST 30 tablet 6   fenofibrate (TRICOR) 145 MG tablet TAKE 1 TABLET BY MOUTH EVERY DAY 90 tablet 3   HUMALOG MIX 75/25 KWIKPEN (75-25) 100 UNIT/ML KwikPen Inject 30-40 Units into the skin See admin instructions. Take 40 units in the AM, then take 30 units in the afternoon per patient     icosapent Ethyl (VASCEPA) 1 g capsule TAKE 2 CAPSULES BY MOUTH TWICE A DAY 120 capsule 5   KLOR-CON M10 10 MEQ tablet TAKE 2 TABLETS BY MOUTH 2 TIMES DAILY. 360 tablet 3   levothyroxine (SYNTHROID) 50 MCG tablet Taking 2  tablets =  162mg Mondays, Wednesdays and Fridays. 1 tablet all other days.     LIVALO 4 MG TABS TAKE 1 TABLET BY MOUTH EVERY DAY 90 tablet 3   metoprolol succinate (TOPROL-XL) 25 MG 24 hr tablet TAKE 1 TABLET BY MOUTH EVERYDAY AT BEDTIME 90 tablet 3   mexiletine (MEXITIL) 250 MG capsule TAKE 1 CAPSULE BY MOUTH 2 TIMES DAILY. 180 capsule 3   Multiple Vitamin (MULTIVITAMIN WITH MINERALS) TABS tablet Take 1 tablet by mouth daily.     ONETOUCH VERIO test strip 1 each 3 (three) times daily.     rOPINIRole (REQUIP) 0.25 MG tablet Take 2 tablets (0.5 mg total) by mouth at bedtime. 180 tablet 0   sacubitril-valsartan (ENTRESTO) 49-51 MG Take 1 tablet by mouth 2 (two) times daily. 60 tablet 3   Semaglutide, 1 MG/DOSE, (OZEMPIC, 1 MG/DOSE,) 4 MG/3ML SOPN Inject 1 mg into the skin once a week.     furosemide (LASIX) 40 MG tablet Take 2 tablets (80 mg total) by mouth 2 (two) times daily. 180 tablet 3   No current facility-administered medications for this encounter.    Allergies:   Neomycin-bacitracin zn-polymyx, Sulfonamide derivatives, Sulfa antibiotics, Ciprofloxacin, and Spironolactone   Social History:  The patient  reports that she quit smoking about 21 months ago. Her smoking use included cigarettes. She has a 50.00 pack-year smoking history. She has never used smokeless tobacco. She reports that she does not drink alcohol and does not use drugs.   Family History:  The patient's family history includes Cancer in her mother; Diabetes in her father, mother, and another family member; Factor V Leiden deficiency in her daughter; Hypertension in her mother; Melanoma in an other family member; Obesity in her father and mother.   ROS:  Please see the history of present illness.   All other systems are personally reviewed and negative.   BP (!) 92/52   Pulse (!) 59   SpO2 96%   Wt Readings from Last 3 Encounters:  03/14/22 121.6 kg (268 lb)  02/22/22 123.4 kg (272 lb)  01/23/22 121.7 kg (268 lb 3.2 oz)    PHYSICAL EXAM: General:  NAD Neck: No JVD, no thyromegaly or thyroid nodule.  Lungs: Clear to auscultation bilaterally with normal respiratory effort. CV: Nondisplaced PMI.  Heart regular S1/S2, no S3/S4, no murmur.  No peripheral edema.  No carotid bruit.  Unable to palpate pedal pulses.  Abdomen: Soft, nontender, no hepatosplenomegaly, no distention.  Skin: Intact without lesions or rashes.  Neurologic: Alert and oriented x 3.  Psych: Normal affect. Extremities: No clubbing or cyanosis.  HEENT: Normal.   Recent Labs: 12/06/2021: Magnesium 2.3; NT-Pro BNP 542 05/26/2022: ALT 27; B Natriuretic Peptide 92.6; BUN 18; Creatinine, Ser 1.44; Hemoglobin 15.0; Platelets 191; Potassium 3.7; Sodium 139; TSH 3.225  Personally reviewed   ASSESSMENT AND PLAN: 1. H/o VT: noted 08/2020. 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 Had been on Tikosyn but this was not the typical Tikosyn-induced polymorphic VT and she had had no changes in Tikosyn dosing prior to occurrence. cMRI showed subendocardial LGE in the basal to mid inferolateral wall. This appeared to be a coronary disease pattern however coronary angiogram did not show significant coronary disease.  She was transitioned from Tikosyn to amiodarone and underwent Medtronic single chamber ICD on 09/15/20. VT on 09/06/21 and was started on mexiletine.  VT again on 11/28/21 after missing amiodarone and mexiletine. No VT since then on device interrogation.  - Continue amiodarone 200 mg daily, check LFTs and TSH.  She will need regular eye exam.  - Continue mexiletine. 2. Atrial fibrillation/flutter: Paroxysmal.  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 She was admitted in 1/18 with atrial fibrillation/RVR and CHF.  She was diuresed and had DCCV back to NSR. She had atrial fibrillation ablation in 4/18.  In 5/19, she went into atrial fibrillation transiently.  In 10/19, she was back in atrial fibrillation and was  admitted for Tikosyn initiation and cardioversion.  Atypical atrial flutter noted in 12/21 with DCCV to NSR. As outlined above, now off Tikosyn and on amiodarone. Underwent repeat DCCV in 3/22.  NSR today.  - Continue amiodarone 200 mg daily.  - Continue Eliquis 5 mg bid. CBC today.  3. Claudication: Left leg claudication with absent left PT pulse.  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   4. Chronic systolic => diastolic CHF: EF 66-06% on echo in 1/18, down from 50-55% in 4/15.  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 In 2/22, cMRI showed LV EF 40% with RV E 38%, inferolateral subendocardial LGE, but echo in 2/22 showed EF 50-55%.  Echo in 8/23 showed EF 60-65%, moderate LVH, normal RV, normal IVC.  Stable NYHA II-early III, functional status limited by body habitus and chronic back pain. She does not look volume overloaded by exam or Optivol.     - Continue Lasix 80 mg bid. BMET today.   - Continue Toprol XL 25 mg daily.   - Continue Entresto 49/51 mg bid  - Continue eplerenone 25 mg daily.  - Continue dapagliflozin 10 mg daily.  5. CAD: Coronary CTA in 2018 showed coronary calcium score in the 82nd percentile with nonobstructive coronary disease. Cath in 2/22 showed only luminal irregularities. She denies anginal symptoms.  - Not on ASA due to Eliquis.  - Continue Vascepa and Livalo.  6. COPD:  History of smoking. Quit 2/22.  7. OSA: Continue CPAP.  8. Obesity: There is no height or  weight on file to calculate BMI. - On semaglutide. 9. CKD: Stage 3.  - BMET today.   Follow up in 4 months with APP  Signed, [name], MD  05/28/2022  [facility] [address] [facility] [place] [address] [phone] (office) [phone] (fax)

## 2022-05-29 ENCOUNTER — Other Ambulatory Visit (HOSPITAL_COMMUNITY): Payer: Self-pay

## 2022-05-29 ENCOUNTER — Telehealth (HOSPITAL_COMMUNITY): Payer: Self-pay

## 2022-05-29 DIAGNOSIS — I5032 Chronic diastolic (congestive) heart failure: Secondary | ICD-10-CM

## 2022-05-29 NOTE — Telephone Encounter (Signed)
Patient aware and agreeable. Labs set up.

## 2022-05-31 ENCOUNTER — Ambulatory Visit (INDEPENDENT_AMBULATORY_CARE_PROVIDER_SITE_OTHER): Payer: Medicare Other | Admitting: Pharmacist

## 2022-05-31 DIAGNOSIS — E7800 Pure hypercholesterolemia, unspecified: Secondary | ICD-10-CM

## 2022-05-31 DIAGNOSIS — Z794 Long term (current) use of insulin: Secondary | ICD-10-CM

## 2022-05-31 DIAGNOSIS — I5020 Unspecified systolic (congestive) heart failure: Secondary | ICD-10-CM

## 2022-05-31 DIAGNOSIS — E1165 Type 2 diabetes mellitus with hyperglycemia: Secondary | ICD-10-CM

## 2022-05-31 MED ORDER — DAPAGLIFLOZIN PROPANEDIOL 10 MG PO TABS: ORAL_TABLET | ORAL | 3 refills | Status: DC

## 2022-05-31 MED ORDER — HUMALOG MIX 75/25 KWIKPEN (75-25) 100 UNIT/ML ~~LOC~~ SUPN: PEN_INJECTOR | SUBCUTANEOUS | 2 refills | Status: DC

## 2022-05-31 NOTE — Progress Notes (Signed)
Pharmacy Note  05/31/2022 Name: Heidi Stark MRN: [id number] DOB: 1941/01/28  Subjective: Heidi Stark is a 81 y.o. year old female who is a primary care patient of [name], [name], DO. Clinical Pharmacist Practitioner referral was placed to assist with medication management.    Engaged with patient by telephone for follow up visit today.  Type 2 DM / obesity - weight has not changed much since Ozmepic started but A1c is great at 6%. Patient is tolerating medications well. Denies hypoglycemia.  Hyperlipidemia: Last LDL was 94 but this was in 2022. Tolerating Livalo - cost has improved now that she is thru coverage gap.  Medication Management: getting Skeet Latch, Humalog Mix 70/25 and [name] thru patient assistance thru 07/16/2022.    SDOH (Social Determinants of Health) assessments and interventions performed:  SDOH Interventions    Flowsheet Row Clinical Support from 10/13/2021 in [place] at [place] Management from 06/20/2021 in [place] at [place] Management from 05/12/2021 in [place] at [place] Interventions     Food Insecurity Interventions Intervention Not Indicated -- Intervention Not Indicated  Housing Interventions Intervention Not Indicated -- --  Transportation Interventions Intervention Not Indicated -- Intervention Not Indicated  Financial Strain Interventions Other (Comment)  [Pt currently getting assistance with medications.] Other (Comment)  [patient has applications for PAP for Livalo, Humalog, Ozempic and Entresto.] --  Physical Activity Interventions Other (Comments)  [Encouraged pt to increase exercise as tolerated.] Other (Comments), Patient Refused  [unable to exercise due to back pain - patient declines back surgery due to past issues with back surgery.] --  Stress Interventions Intervention Not Indicated -- --   Social Connections Interventions Intervention Not Indicated -- --        Objective: Review of patient status, including review of consultants reports, laboratory and other test data, was performed as part of comprehensive evaluation and provision of chronic care management services.   Lab Results  Component Value Date   CREATININE 1.44 (H) 05/26/2022   CREATININE 1.66 (H) 02/22/2022   CREATININE 1.60 (H) 02/03/2022    Lab Results  Component Value Date   HGBA1C 6.0 12/21/2021       Component Value Date/Time   CHOL 186 01/27/2021 1146   CHOL 170 06/02/2019 0913   TRIG 292 (H) 01/27/2021 1146   HDL 34 (L) 01/27/2021 1146   HDL 31 (L) 06/02/2019 0913   CHOLHDL 5.5 01/27/2021 1146   VLDL 58 (H) 01/27/2021 1146   LDLCALC 94 01/27/2021 1146   LDLCALC 92 06/02/2019 0913   LDLCALC 77 06/28/2018 1410   LDLDIRECT 42.0 11/30/2014 1226     Clinical ASCVD: Yes  The ASCVD Risk score (Arnett DK, et al., 2019) failed to calculate for the following reasons:   The 2019 ASCVD risk score is only valid for ages 56 to 6    BP Readings from Last 3 Encounters:  05/26/22 (!) 92/52  03/14/22 (!) 80/60  02/22/22 106/60     Allergies  Allergen Reactions   Neomycin-Bacitracin Zn-Polymyx Rash   Sulfonamide Derivatives Other (See Comments)    Stomach cramps   Sulfa Antibiotics Other (See Comments)   Ciprofloxacin Hives, Itching and Rash    Pt doesn't remember having any reaction to cipro   Spironolactone Rash    Medications Reviewed Today     Reviewed by [name], RPH-CPP (Pharmacist) on 05/31/22 at 1436  Med List Status: <None>   Medication Order Taking? Sig Documenting Provider Last Dose Status Informant  acetaminophen (TYLENOL) 500 MG tablet [id number] Yes Take 500 mg by mouth every 6 (six) hours as needed for moderate pain or headache. [provider] Taking Active Self  amiodarone (PACERONE) 200 MG tablet [id number] Yes Take 1 tablet (200 mg total) by mouth daily.   Patient taking differently: Take 100 mg by mouth daily.   Shirley Friar, PA-C Taking Active   BD PEN NEEDLE NANO 2ND GEN 32G X 4 MM MISC [id number] Yes See admin instructions. [provider] Taking Active   dapagliflozin propanediol (FARXIGA) 10 MG TABS tablet [id number] Yes TAKE 1 TABLET BY MOUTH EVERY DAY BEFORE BREAKFAST Ann Held, DO Taking Active   ELIQUIS 5 MG TABS tablet [id number] Yes TAKE 1 TABLET BY MOUTH TWICE A DAY Larey Dresser, MD Taking Active   eplerenone (INSPRA) 25 MG tablet [id number] Yes TAKE 1 TABLET BY MOUTH EVERY DAY Larey Dresser, MD Taking Active   fenofibrate (TRICOR) 145 MG tablet [id number] Yes TAKE 1 TABLET BY MOUTH EVERY DAY Larey Dresser, MD Taking Active   furosemide (LASIX) 40 MG tablet [id number] Yes Take 2 tablets (80 mg total) by mouth 2 (two) times daily. Larey Dresser, MD Taking Active   HUMALOG MIX 75/25 KWIKPEN (75-25) 100 UNIT/ML KwikPen [id number] Yes Take 40 units with morning meal and 30 units with evening meal [name], Kendrick Fries R, DO Taking Active   icosapent Ethyl (VASCEPA) 1 g capsule [id number] Yes TAKE 2 CAPSULES BY MOUTH TWICE A DAY Larey Dresser, MD Taking Active   KLOR-CON M10 10 MEQ tablet [id number] Yes TAKE 2 TABLETS BY MOUTH 2 TIMES DAILY. Larey Dresser, MD Taking Active   levothyroxine (SYNTHROID) 50 MCG tablet [id number] Yes Taking 2  tablets = 169mg Mondays, Wednesdays and Fridays. 1 tablet all other days. [provider] Taking Active   LIVALO 4 MG TABS [id number] TAKE 1 TABLET BY MOUTH EVERY DAY MLarey Dresser MD Taking Active   metoprolol succinate (TOPROL-XL) 25 MG 24 hr tablet [id number] TAKE 1 TABLET BY MOUTH EVERYDAY AT BEDTIME MLarey Dresser MD Taking Active   mexiletine (MEXITIL) 250 MG capsule [id number] TAKE 1 CAPSULE BY MOUTH 2 TIMES DAILY. CConstance Haw MD Taking Active   Multiple Vitamin (MULTIVITAMIN WITH MINERALS) TABS tablet [id number] Take 1  tablet by mouth daily. [provider] Taking Active Self  ONETOUCH VERIO test strip [id number] 1 each 3 (three) times daily. [provider] Taking Active   rOPINIRole (REQUIP) 0.25 MG tablet [id number] Take 2 tablets (0.5 mg total) by mouth at bedtime. LCarollee Herter YKendrick FriesR, DO Taking Active   sacubitril-valsartan (ENTRESTO) 49-51 MG [id number] Take 1 tablet by mouth 2 (two) times daily. MLarey Dresser MD Taking Active Self           Med Note ([facility]  Fri May 26, 2022  1:50 PM)    Semaglutide, 1 MG/DOSE, (OZEMPIC, 1 MG/DOSE,) 4 MG/3ML [facility] [id number] Inject 1 mg into the skin once a week. [provider] Taking Active            Med Note ([facility]  Fri May 26, 2022  1:50 PM)              Patient Active Problem List   Diagnosis Date Noted   Diabetic nephropathy ([place] 10/13/2021  Hyperglycemia due to type 2 diabetes mellitus ([place]) 10/13/2021   Proteinuria 10/13/2021   Pure hypercholesterolemia 10/13/2021   COVID-19 04/15/2021   Respiratory failure with hypoxia ([place]) 62/09/5595   Systolic CHF ([place]) 41/63/8453   CKD (chronic kidney disease) stage 3, GFR 30-59 ml/min (HCC) 04/15/2021   Elevated troponin 04/15/2021   Acute on chronic systolic (congestive) heart failure ([place]) 09/14/2020   Wide-complex tachycardia 09/07/2020   Ventricular tachyarrhythmia ([place]) 09/07/2020   VT (ventricular tachycardia) ([place]) 09/07/2020   Persistent atrial fibrillation ([place]) 04/30/2018   Wound of left leg 10/01/2017   Cellulitis of right lower extremity 11/27/2016   Encounter for assessment for deep vein thrombosis (DVT) 11/14/2016   Hypokalemia 08/13/2016   Type 2 diabetes mellitus with hyperglycemia, with long-term current use of insulin (HCC)    Atherosclerosis of aorta ([place]) 06/26/2016   Colitis 11/24/2014   Severe obesity (BMI >= 40) ([place]) 11/12/2013   PAF (paroxysmal atrial fibrillation) ([place]) 11/03/2013   History of  thromboembolism - 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 10/28/2013   COLONIC POLYPS 08/16/2010   PAD (peripheral artery disease) ([place]) 08/15/2010   HLD (hyperlipidemia) 11/18/2009   MYALGIA 11/18/2009   OBESITY 05/14/2007   PULMONARY NODULE, RIGHT MIDDLE LOBE 05/14/2007   Obstructive sleep apnea 12/06/2006   Essential hypertension 12/06/2006   COPD (chronic obstructive pulmonary disease) ([place]) 12/06/2006   Medication Assistance: Approved to receive Farxiga '10mg'$  thru [place] [employer] [place] patient assistance program thru 07/16/2022. Approved 08/30/2021 to received Humalog 75/25 Mix from [employer] thru 07/16/2022 and also approved to received Entresto from [facility] thru 07/16/2022.  Approved to received Ozempic form [employer] thru 07/16/2022   Patient did not qualify for patient assistance program for Livalo due to income restrictions.     Assessment / Plan: Type 2 DM / obesity - A1c at goal Continue current therapy.   Hyperlipidemia: Last LDL was 94 but this was in 2022. Recommended check lipids at next office appointment. Continue Livalo and Vascepa.    Medication Management:  Reviewed med list and refill history.  Starting process for medication assistance program for 2024. Completed provider portion of Humalog app on line.  Patient has already been re-enrolled for Farxiga per letter she received - thru 07/17/2023. Will fax updated Rx to [place] [employer].  Mailed [employer] / Hotel manager.  Patient has Entresto / [place] and will return to either our office or Dr [name].   Follow Up:  Telephone follow up appointment with care management team member scheduled for:  2 months   [name], PharmD Clinical Pharmacist [facility] [place] [phone]

## 2022-06-02 ENCOUNTER — Telehealth: Payer: Self-pay

## 2022-06-02 NOTE — Telephone Encounter (Signed)
Mailed [place] renewal application to patient home.   Please have office staff fax to [phone] ATTENTION Heidi Stark. Patient will be returning to office.   Allison Silva L. CPhT Rx Patient Advocate 

## 2022-06-13 ENCOUNTER — Other Ambulatory Visit (HOSPITAL_COMMUNITY): Payer: Self-pay | Admitting: *Deleted

## 2022-06-13 ENCOUNTER — Other Ambulatory Visit (HOSPITAL_COMMUNITY): Payer: Self-pay

## 2022-06-13 MED ORDER — APIXABAN 5 MG PO TABS: 5.0000 mg | ORAL_TABLET | Freq: Two times a day (BID) | ORAL | 11 refills | Status: DC

## 2022-06-15 ENCOUNTER — Ambulatory Visit (INDEPENDENT_AMBULATORY_CARE_PROVIDER_SITE_OTHER): Payer: Medicare Other

## 2022-06-15 DIAGNOSIS — I472 Ventricular tachycardia, unspecified: Secondary | ICD-10-CM

## 2022-06-15 LAB — CUP PACEART REMOTE DEVICE CHECK
Battery Remaining Longevity: 124 mo
Battery Voltage: 3.02 V
Brady Statistic RV Percent Paced: 0.41 %
Date Time Interrogation Session: 20231130043822
HighPow Impedance: 95 Ohm
Implantable Lead Connection Status: 753985
Implantable Lead Implant Date: 20220302
Implantable Lead Location: 753860
Implantable Pulse Generator Implant Date: 20220302
Lead Channel Impedance Value: 589 Ohm
Lead Channel Impedance Value: 665 Ohm
Lead Channel Pacing Threshold Amplitude: 2.25 V
Lead Channel Pacing Threshold Pulse Width: 0.4 ms
Lead Channel Sensing Intrinsic Amplitude: 8.625 mV
Lead Channel Sensing Intrinsic Amplitude: 8.625 mV
Lead Channel Setting Pacing Amplitude: 4.5 V
Lead Channel Setting Pacing Pulse Width: 0.4 ms
Lead Channel Setting Sensing Sensitivity: 0.3 mV
Zone Setting Status: 755011
Zone Setting Status: 755011

## 2022-06-15 NOTE — Telephone Encounter (Signed)
Received a call from patient about her PAP app for [place] ([place]) she states that she received the app in mail but [employer] had auto re enrolled her for next year 07/16/2023     [facility] Rx Patient Advocate

## 2022-06-21 ENCOUNTER — Other Ambulatory Visit (HOSPITAL_COMMUNITY): Payer: Medicare Other

## 2022-06-21 ENCOUNTER — Telehealth: Payer: Self-pay | Admitting: Pharmacist

## 2022-06-21 NOTE — Telephone Encounter (Signed)
Called pt to let her know that her Ozempic has arrived. Pt wishes to increase back to '2mg'$ . I advised we will have to wait until she is finished with the 2 months of '1mg'$  she just got. Reminded her she needs to re-apply for next year. Will leave application for her with medication. She says she got a letter from insurance that they wont cover vascepa next year. I asked her to bring in the letter so we can see it.

## 2022-06-25 ENCOUNTER — Other Ambulatory Visit: Payer: Self-pay | Admitting: Student

## 2022-06-25 DIAGNOSIS — I472 Ventricular tachycardia, unspecified: Secondary | ICD-10-CM

## 2022-06-27 ENCOUNTER — Ambulatory Visit (HOSPITAL_COMMUNITY)
Admission: RE | Admit: 2022-06-27 | Discharge: 2022-06-27 | Disposition: A | Discharge status: Home / Self Care | Payer: Medicare Other | Source: Ambulatory Visit | Attending: Internal Medicine | Admitting: Internal Medicine

## 2022-06-27 DIAGNOSIS — I5032 Chronic diastolic (congestive) heart failure: Secondary | ICD-10-CM | POA: Insufficient documentation

## 2022-06-27 LAB — COMPREHENSIVE METABOLIC PANEL
ALT: 21 U/L (ref 0–44)
AST: 43 U/L — ABNORMAL HIGH (ref 15–41)
Albumin: 3.3 g/dL — ABNORMAL LOW (ref 3.5–5.0)
Alkaline Phosphatase: 41 U/L (ref 38–126)
Anion gap: 11 (ref 5–15)
BUN: 20 mg/dL (ref 8–23)
CO2: 28 mmol/L (ref 22–32)
Calcium: 8.4 mg/dL — ABNORMAL LOW (ref 8.9–10.3)
Chloride: 100 mmol/L (ref 98–111)
Creatinine, Ser: 1.35 mg/dL — ABNORMAL HIGH (ref 0.44–1.00)
GFR, Estimated: 40 mL/min — ABNORMAL LOW (ref >60)
Glucose, Bld: 147 mg/dL — ABNORMAL HIGH (ref 70–99)
Potassium: 3.6 mmol/L (ref 3.5–5.1)
Sodium: 139 mmol/L (ref 135–145)
Total Bilirubin: 0.2 mg/dL — ABNORMAL LOW (ref 0.3–1.2)
Total Protein: 6.7 g/dL (ref 6.5–8.1)

## 2022-06-29 NOTE — Telephone Encounter (Signed)
Insurance is not going to cover icosapent ethyl next year, but will cover Vascepa. Called pt to let her know that they will cover brand and we can send in an Rx for brand only No answer and unable to leave VM.  Will try again later

## 2022-07-05 ENCOUNTER — Telehealth: Payer: Self-pay

## 2022-07-05 MED ORDER — MEXILETINE HCL 150 MG PO CAPS: 300.0000 mg | ORAL_CAPSULE | Freq: Two times a day (BID) | ORAL | 3 refills | Status: DC

## 2022-07-05 NOTE — Progress Notes (Signed)
Remote ICD transmission.   

## 2022-07-05 NOTE — Telephone Encounter (Signed)
-----   Message from Heidi [name], MD sent at 06/16/2022  3:40 PM EST ----- Abnormal device interrogation reviewed.  Lead parameters and battery status stable.  Elevated VT burden.  Increase mexiletine to 300 mg twice daily.  Needs clinic follow-up with me or EP app

## 2022-07-05 NOTE — Telephone Encounter (Signed)
The patient has been notified of the result and verbalized understanding.  All questions (if any) were answered. Bernestine Amass, RN 07/05/2022 9:06 AM  New prescription has been sent in. Will forward to [facility] to arrange follow up

## 2022-07-10 ENCOUNTER — Other Ambulatory Visit: Payer: Self-pay | Admitting: Family Medicine

## 2022-07-18 MED ORDER — VASCEPA 1 G PO CAPS: 2.0000 g | ORAL_CAPSULE | Freq: Two times a day (BID) | ORAL | 11 refills | Status: DC

## 2022-07-18 NOTE — Addendum Note (Signed)
Addended by: Marcelle Overlie D on: 07/18/2022 04:31 PM   Modules accepted: Orders

## 2022-07-29 NOTE — Progress Notes (Unsigned)
Cardiology Office Note Date:  08/01/2022  Patient ID:  Heidi Stark, Heidi Stark [birth year], MRN [id number] PCP:  [name], [name], DO  Cardiologist:  [name], MD Electrophysiologist: Constance Haw, MD  Chief Complaint: Increased   History of Present Illness: Heidi Stark is a 82 y.o. female with PMH notable for parox AF w RVR, VT, HFrecEF, CAD, HTN, tobacco abuse, COPD, OSA on CPAP, DM ; seen today for Heidi [name], MD for acute visit due to increased VT burden via device remote interrogation.   Last saw HF Cards Dr. [name] 05/2022, mobility limited by back pain, no AF or VT on device. Mexiletine and amio continued.  On 06/15/22 remote device check, elevated VT burden noted. Dr. [name] incresed mexiletine to '300mg'$  BID via telephone.   Today, patient reports that she feels overall fine. She has some intermittent palpitations that last just a couple minutes. These episodes are stable and have been going on "forever". She is not particularly active, but has not noticed increased SOB or dyspnea with her usual activities.   She is diligent with her medications, taking eliquis BID; no bleeding concerns.   she denies chest pain, dyspnea, PND, orthopnea, nausea, vomiting, dizziness, syncope, edema, weight gain, or early satiety.    Device Information: MDT ICD, mp 09/2020; dx: NICM, VT + appropriate therapies  AAD History: Tikosyn, off d/t VT (though not thought to be tikosyn induced VT) Mexiletine '150mg'$  BID > inc to '300mg'$  BID ~07/10/22 Amiodarone   Past Medical History:  Diagnosis Date   Arthritis    "hands, wrists" (11/07/2016)   CHF (congestive heart failure) (HCC)    Chronic lower back pain    Constipation    DVT (deep venous thrombosis) ([place])    History of blood transfusion 1990   "related to OR"   History of kidney stones    Hyperlipidemia    Hypertension    Joint pain    Lower extremity edema    OSA on CPAP    Persistent atrial fibrillation (Heidi Stark)     Pneumonia    "several times" (11/07/2016)   Type II diabetes mellitus ([place])     Past Surgical History:  Procedure Laterality Date   ATRIAL FIBRILLATION ABLATION N/A 11/07/2016   Procedure: Atrial Fibrillation Ablation;  Surgeon: Heidi [name], MD;  Location: [place] [facility];  Service: Cardiovascular;  Laterality: N/A;   BACK SURGERY     CARDIAC CATHETERIZATION     CARDIOVERSION N/A 08/11/2016   Procedure: CARDIOVERSION;  Surgeon: Larey Dresser, MD;  Location: [place];  Service: Cardiovascular;  Laterality: N/A;   CARDIOVERSION N/A 12/03/2017   Procedure: CARDIOVERSION;  Surgeon: Larey Dresser, MD;  Location: [facility] ENDOSCOPY;  Service: Cardiovascular;  Laterality: N/A;   CARDIOVERSION N/A 05/02/2018   Procedure: CARDIOVERSION;  Surgeon: Pixie Casino, MD;  Location: [facility] ENDOSCOPY;  Service: Cardiovascular;  Laterality: N/A;   CARDIOVERSION N/A 06/23/2020   Procedure: CARDIOVERSION;  Surgeon: [name], MD;  Location: [facility] ENDOSCOPY;  Service: Cardiovascular;  Laterality: N/A;   CARDIOVERSION N/A 10/11/2020   Procedure: CARDIOVERSION;  Surgeon: Larey Dresser, MD;  Location: [facility] ENDOSCOPY;  Service: Cardiovascular;  Laterality: N/A;   CARPAL TUNNEL RELEASE     CATARACT EXTRACTION W/ INTRAOCULAR LENS  IMPLANT, BILATERAL Bilateral    COLON SURGERY  1990   vein graft and colon repair after nicked artery with back surgert   COLONOSCOPY     CORONARY ANGIOGRAPHY N/A 09/08/2020   Procedure:  CORONARY ANGIOGRAPHY;  Surgeon: Larey Dresser, MD;  Location: [place] [facility];  Service: Cardiovascular;  Laterality: N/A;   CYSTECTOMY     between bladder and kidneys   GANGLION CYST EXCISION Left    ICD IMPLANT N/A 09/15/2020   Procedure: ICD IMPLANT;  Surgeon: Constance Haw, MD;  Location: [place] [facility];  Service: Cardiovascular;  Laterality: N/A;   KNEE CARTILAGE SURGERY Right 1960s   LUMBAR [place] ILIAC ARTERY  1990   vein graft and colon  repair after nicked artery with back surgert   TEE WITHOUT CARDIOVERSION N/A 10/31/2013   Procedure: TRANSESOPHAGEAL ECHOCARDIOGRAM (TEE);  Surgeon: Larey Dresser, MD;  Location: [facility] ENDOSCOPY;  Service: Cardiovascular;  Laterality: N/A;   TUBAL LIGATION      Current Outpatient Medications  Medication Instructions   acetaminophen (TYLENOL) 500 mg, Oral, Every 6 hours PRN   amiodarone (PACERONE) 200 mg, Oral, Daily   apixaban (ELIQUIS) 5 mg, Oral, 2 times daily   BD PEN NEEDLE NANO 2ND GEN 32G X 4 MM MISC See admin instructions   dapagliflozin propanediol (FARXIGA) 10 MG TABS tablet TAKE 1 TABLET BY MOUTH EVERY DAY BEFORE BREAKFAST   eplerenone (INSPRA) 25 mg, Oral, Daily   fenofibrate (TRICOR) 145 MG tablet TAKE 1 TABLET BY MOUTH EVERY DAY   furosemide (LASIX) 80 mg, Oral, 2 times daily   Gabapentin 50 MG TABS Oral, Daily at bedtime   HUMALOG MIX 75/25 KWIKPEN (75-25) 100 UNIT/ML KwikPen Take 40 units with morning meal and 30 units with evening meal   KLOR-CON M10 10 MEQ tablet TAKE 2 TABLETS BY MOUTH 2 TIMES DAILY.   levothyroxine (SYNTHROID) 50 MCG tablet Taking 2  tablets = 19mg Mondays, Wednesdays and Fridays. 1 tablet all other days.   LIVALO 4 MG TABS TAKE 1 TABLET BY MOUTH EVERY DAY   metoprolol succinate (TOPROL-XL) 25 MG 24 hr tablet TAKE 1 TABLET BY MOUTH EVERYDAY AT BEDTIME   mexiletine (MEXITIL) 300 mg, Oral, 2 times daily   Multiple Vitamin (MULTIVITAMIN WITH MINERALS) TABS tablet 1 tablet, Oral, Daily   ONETOUCH VERIO test strip 1 each, 3 times daily   Ozempic (1 MG/DOSE) 1 mg, Subcutaneous, Weekly   rOPINIRole (REQUIP) 0.5 mg, Oral, Daily at bedtime   sacubitril-valsartan (ENTRESTO) 49-51 MG 1 tablet, Oral, 2 times daily   Vascepa 2 g, Oral, 2 times daily      Social History:  The patient  reports that she quit smoking about 1 years ago. Her smoking use included cigarettes. She has a 50.00 pack-year smoking history. She has never used smokeless tobacco. She reports  that she does not drink alcohol and does not use drugs.   Family History:  The patient's family history includes Cancer in her mother; Diabetes in her father, mother, and another family member; Factor V Leiden deficiency in her daughter; Hypertension in her mother; Melanoma in an other family member; Obesity in her father and mother.  ROS:  Please see the history of present illness. All other systems are reviewed and otherwise negative.   PHYSICAL EXAM:  VS:  BP (!) 94/58   Pulse (!) 56   Ht '5\' 6"'$  (1.676 m)   Wt 269 lb (122 kg)   SpO2 97%   BMI 43.42 kg/m  BMI: Body mass index is 43.42 kg/m.  GEN- The patient is chronically ill- appearing, alert and oriented x 3 today.  In wheelchair during exam. HEENT: normocephalic, atraumatic; sclera  clear, conjunctiva pink; hearing intact; oropharynx clear; neck supple, no JVP Lungs- Clear to ausculation bilaterally, normal work of breathing.  No wheezes, rales, rhonchi Heart- Regular rate and rhythm, no murmurs, rubs or gallops, PMI not laterally displaced GI- soft, non-tender, non-distended, bowel sounds present, no hepatosplenomegaly Extremities- No peripheral edema. no clubbing or cyanosis; DP/PT/radial pulses 2+ bilaterally MS- no significant deformity or atrophy Skin- warm and dry, no rash or lesion, device pocket well-healed Psych- euthymic mood, full affect Neuro- strength and sensation are intact  Device interrogation done today and reviewed by myself:  Battery good Lead thresholds, impedence, sensing stable  Optivol low, thoracic impedence stable AF episodes, no additional VT episodes No changes made today  EKG is not ordered.   Recent Labs: 12/06/2021: Magnesium 2.3; NT-Pro BNP 542 05/26/2022: B Natriuretic Peptide 92.6; Hemoglobin 15.0; Platelets 191; TSH 3.225 06/27/2022: ALT 21; BUN 20; Creatinine, Ser 1.35; Potassium 3.6; Sodium 139  No results found for requested labs within last 365 days.   CrCl cannot be calculated  (Patient's most recent lab result is older than the maximum 21 days allowed.).   Wt Readings from Last 3 Encounters:  08/01/22 269 lb (122 kg)  03/14/22 268 lb (121.6 kg)  02/22/22 272 lb (123.4 kg)     Additional studies reviewed include: Previous EP, cardiology notes.   TTE 02/22/2022  1. Left ventricular ejection fraction, by estimation, is 60 to 65%. The left ventricle has normal function. The left ventricle has no regional wall motion abnormalities. There is moderate left ventricular hypertrophy. Left ventricular diastolic parameters are consistent with Grade II diastolic dysfunction (pseudonormalization). Elevated left atrial pressure.  2. Right ventricular systolic function is normal. The right ventricular size is normal. There is normal pulmonary artery systolic pressure. The estimated right ventricular systolic pressure is 09.6 mmHg.  3. Left atrial size was moderately dilated.  4. Right atrial size was mildly dilated.  5. The mitral valve is degenerative. No evidence of mitral valve regurgitation. No evidence of mitral stenosis. Severe mitral annular calcification.  6. The aortic valve is tricuspid. Aortic valve regurgitation is not visualized. Aortic valve sclerosis is present, with no evidence of aortic valve stenosis.  7. Aortic dilatation noted. There is mild dilatation of the ascending aorta, measuring 38 mm.  8. The inferior vena cava is normal in size with greater than 50% respiratory variability, suggesting right atrial pressure of 3 mmHg.  ASSESSMENT AND PLAN:  #) VT s/p ICD On mexiletine '300mg'$  BID Tolerating well Since starting increase dose (sometime around 07/10/22), per device has had no additional VT episodes  #) parox AF #) Hypercoag d/t AF On amiodarone '200mg'$  daily Amio labs up to date Minimal AF by device  CHA2DS2-VASc Score = 7 [CHF History: 1, HTN History: 1, Diabetes History: 1, Stroke History: 0, Vascular Disease History: 1, Age Score: 2, Gender Score:  1].  Therefore, the patient's annual risk of stroke is 11.2 % OAC - eliquis '5mg'$  BID, dosed appropriately  #) HFrecEF Euvolemic on exam Optivol low with stable thoracic impedence GDMT: BB, eplerenone, entresto; diuretic lasix Follows regularly with HF team   #) OSA CPAP compliance encouraged   Current medicines are reviewed at length with the patient today.   The patient does not have concerns regarding her medicines.  The following changes were made today:  none  Labs/ tests ordered today include:  No orders of the defined types were placed in this encounter.    Disposition: Follow up with Dr. [name] in  in 6 months   Signed, Mamie Levers, NP  08/01/22 11:36 AM   [facility] [address] [address] [phone] (office)  [phone] (fax)

## 2022-08-01 ENCOUNTER — Ambulatory Visit: Payer: Medicare Other | Attending: Physician Assistant | Admitting: Cardiology

## 2022-08-01 ENCOUNTER — Encounter: Payer: Self-pay | Admitting: Cardiology

## 2022-08-01 VITALS — BP 94/58 | HR 56 | Ht 66.0 in | Wt 269.0 lb

## 2022-08-01 DIAGNOSIS — I5020 Unspecified systolic (congestive) heart failure: Secondary | ICD-10-CM

## 2022-08-01 DIAGNOSIS — Z95810 Presence of automatic (implantable) cardiac defibrillator: Secondary | ICD-10-CM

## 2022-08-01 DIAGNOSIS — I480 Paroxysmal atrial fibrillation: Secondary | ICD-10-CM

## 2022-08-01 DIAGNOSIS — I472 Ventricular tachycardia, unspecified: Secondary | ICD-10-CM

## 2022-08-01 LAB — CUP PACEART INCLINIC DEVICE CHECK
Battery Remaining Longevity: 123 mo
Battery Voltage: 3.02 V
Brady Statistic RV Percent Paced: 0.35 %
Date Time Interrogation Session: 20240116120456
HighPow Impedance: 89 Ohm
Implantable Lead Connection Status: 753985
Implantable Lead Implant Date: 20220302
Implantable Lead Location: 753860
Implantable Pulse Generator Implant Date: 20220302
Lead Channel Impedance Value: 646 Ohm
Lead Channel Impedance Value: 760 Ohm
Lead Channel Pacing Threshold Amplitude: 2.25 V
Lead Channel Pacing Threshold Pulse Width: 0.4 ms
Lead Channel Sensing Intrinsic Amplitude: 10.75 mV
Lead Channel Sensing Intrinsic Amplitude: 11.375 mV
Lead Channel Setting Pacing Amplitude: 4 V
Lead Channel Setting Pacing Pulse Width: 0.4 ms
Lead Channel Setting Sensing Sensitivity: 0.3 mV
Zone Setting Status: 755011
Zone Setting Status: 755011

## 2022-08-01 NOTE — Patient Instructions (Signed)
Medication Instructions:   Your physician recommends that you continue on your current medications as directed. Please refer to the Current Medication list given to you today.  *If you need a refill on your cardiac medications before your next appointment, please call your pharmacy*   Lab Work:  NONE ORDERED  TODAY   If you have labs (blood work) drawn today and your tests are completely normal, you will receive your results only by: MyChart Message (if you have MyChart) OR A paper copy in the mail If you have any lab test that is abnormal or we need to change your treatment, we will call you to review the results.   Testing/Procedures: NONE ORDERED  TODAY    Follow-Up: At [facility], you and your health needs are our priority.  As part of our continuing mission to provide you with exceptional heart care, we have created designated Provider Care Teams.  These Care Teams include your primary Cardiologist (physician) and Advanced Practice Providers (APPs -  Physician Assistants and Nurse Practitioners) who all work together to provide you with the care you need, when you need it.  We recommend signing up for the patient portal called "MyChart".  Sign up information is provided on this After Visit Summary.  MyChart is used to connect with patients for Virtual Visits (Telemedicine).  Patients are able to view lab/test results, encounter notes, upcoming appointments, etc.  Non-urgent messages can be sent to your provider as well.   To learn more about what you can do with MyChart, go to [email or web address].    Your next appointment:   6 month(s)  Provider:   You may see Will Martin Camnitz, MD or one of the following Advanced Practice Providers on your designated Care Team:   Renee Ursuy, PA-C Michael "Andy" Tillery, PA-C Suzann Riddle, NP    Other Instructions   

## 2022-08-07 ENCOUNTER — Encounter (HOSPITAL_COMMUNITY): Payer: Self-pay | Admitting: Cardiology

## 2022-08-07 ENCOUNTER — Ambulatory Visit: Payer: Medicare Other | Admitting: Pharmacist

## 2022-08-07 DIAGNOSIS — I5020 Unspecified systolic (congestive) heart failure: Secondary | ICD-10-CM

## 2022-08-07 DIAGNOSIS — I10 Essential (primary) hypertension: Secondary | ICD-10-CM

## 2022-08-07 DIAGNOSIS — E1165 Type 2 diabetes mellitus with hyperglycemia: Secondary | ICD-10-CM

## 2022-08-07 DIAGNOSIS — I480 Paroxysmal atrial fibrillation: Secondary | ICD-10-CM

## 2022-08-07 NOTE — Progress Notes (Signed)
Pharmacy Note  08/07/2022 Name: MIEKE BRINLEY MRN: [id number] DOB: 1941/02/03  Subjective: Heidi Stark is a 82 y.o. year old female who is a primary care patient of [name], [name], DO. Clinical Pharmacist Practitioner referral was placed to assist with medication management.    Engaged with patient by telephone for follow up visit today.  Type 2 DM / obesity - weight has decreased a little recently. Patient is taking Ozmepic '1mg'$  weekly, Humalog 75/25 mix - 40 units prior to breakfast and 30 units prior to evening meal. Patient received assistance with diabetes meds from [employer] and [place] and Me program in 2023.  Home blood glucose usually 100 to 140' s.  Patient is tolerating medications well. Denies hypoglycemia.   Wt Readings from Last 3 Encounters:  08/01/22 269 lb (122 kg)  03/14/22 268 lb (121.6 kg)  02/22/22 272 lb (123.4 kg)   Hyperlipidemia: Last LDL was 94 but this was in 2022. Tolerating Livalo well. Patient's icosapent was changed to Vascepa 06/21/2022 by Cardio team due to icosapent ethyl not being on 2024 formulary.   Afib:  CHADSVasc = 7; Taking Eliquis for anticoagulation.  Taking mexiletine '150mg'$  twice a day and amiodarone '200mg'$  0.5 tablet daily for rate control.  Mexiletine was increased in December.  08/01/2022 - Cardio Visit  (Riddle, NP) No additional VT episode since dose of mexiletine increased to '300mg'$  twice a day around 07/10/2022.   CHF: Sees cardio team in advanced heart failure clinic. Patient is taking [name], Eplerenone, metoprolol.  She weights daily and limits sodium intake.  Patient does mention that blood pressure is sometimes low (SBP < 100). She has sent message to cardiologist office and is awaiting response.    Objective: Review of patient status, including review of consultants reports, laboratory and other test data, was performed as part of comprehensive.  Lab Results  Component Value Date   CREATININE 1.35 (H)  06/27/2022   CREATININE 1.44 (H) 05/26/2022   CREATININE 1.66 (H) 02/22/2022    Lab Results  Component Value Date   HGBA1C 6.0 12/21/2021       Component Value Date/Time   CHOL 186 01/27/2021 1146   CHOL 170 06/02/2019 0913   TRIG 292 (H) 01/27/2021 1146   HDL 34 (L) 01/27/2021 1146   HDL 31 (L) 06/02/2019 0913   CHOLHDL 5.5 01/27/2021 1146   VLDL 58 (H) 01/27/2021 1146   LDLCALC 94 01/27/2021 1146   LDLCALC 92 06/02/2019 0913   LDLCALC 77 06/28/2018 1410   LDLDIRECT 42.0 11/30/2014 1226     Clinical ASCVD: Yes  The ASCVD Risk score (Arnett DK, et al., 2019) failed to calculate for the following reasons:   The 2019 ASCVD risk score is only valid for ages 70 to 33    BP Readings from Last 3 Encounters:  08/01/22 (!) 94/58  05/26/22 (!) 92/52  03/14/22 (!) 80/60     Allergies  Allergen Reactions   Neomycin-Bacitracin Zn-Polymyx Rash   Sulfonamide Derivatives Other (See Comments)    Stomach cramps   Sulfa Antibiotics Other (See Comments)   Ciprofloxacin Hives, Itching and Rash    Pt doesn't remember having any reaction to cipro   Spironolactone Rash    Medications Reviewed Today     Reviewed by Jeremy Johann, CMA (Certified Medical Assistant) on 08/01/22 at 1105  Med List Status: <None>   Medication Order Taking? Sig Documenting Provider Last Dose Status Informant  acetaminophen (TYLENOL) 500 MG tablet  [id number] Yes Take 500 mg by mouth every 6 (six) hours as needed for moderate pain or headache. [provider] Taking Active Self  amiodarone (PACERONE) 200 MG tablet [id number] Yes TAKE 1 TABLET BY MOUTH EVERY DAY  Patient taking differently: Take 100 mg by mouth daily.   Larey Dresser, MD Taking Active   apixaban (ELIQUIS) 5 MG TABS tablet [id number] Yes Take 1 tablet (5 mg total) by mouth 2 (two) times daily. Larey Dresser, MD Taking Active   BD PEN NEEDLE NANO 2ND GEN 32G X 4 MM MISC [id number] Yes See admin instructions. [provider] Taking Active   dapagliflozin propanediol (FARXIGA) 10 MG TABS tablet [id number] Yes TAKE 1 TABLET BY MOUTH EVERY DAY BEFORE BREAKFAST [name], [name], DO Taking Active   eplerenone (INSPRA) 25 MG tablet [id number] Yes TAKE 1 TABLET BY MOUTH EVERY DAY Larey Dresser, MD Taking Active   fenofibrate (TRICOR) 145 MG tablet [id number] Yes TAKE 1 TABLET BY MOUTH EVERY DAY Larey Dresser, MD Taking Active   furosemide (LASIX) 40 MG tablet [id number] Yes Take 2 tablets (80 mg total) by mouth 2 (two) times daily. Larey Dresser, MD Taking Active   HUMALOG MIX 75/25 KWIKPEN (75-25) 100 UNIT/ML KwikPen [id number] Yes Take 40 units with morning meal and 30 units with evening meal [name], [name], DO Taking Active   KLOR-CON M10 10 MEQ tablet [id number] Yes TAKE 2 TABLETS BY MOUTH 2 TIMES DAILY. Larey Dresser, MD Taking Active   levothyroxine (SYNTHROID) 50 MCG tablet [id number] Yes Taking 2  tablets = 163mg Mondays, Wednesdays and Fridays. 1 tablet all other days. [provider] Taking Active   LIVALO 4 MG TABS [id number] TAKE 1 TABLET BY MOUTH EVERY DAY MLarey Dresser MD Taking Active   metoprolol succinate (TOPROL-XL) 25 MG 24 hr tablet [id number] TAKE 1 TABLET BY MOUTH EVERYDAY AT BEDTIME MLarey Dresser MD Taking Active   mexiletine (MEXITIL) 150 MG capsule [id number] Take 2 capsules (300 mg total) by mouth 2 (two) times daily. CConstance Haw MD Taking Active   Multiple Vitamin (MULTIVITAMIN WITH MINERALS) TABS tablet [id number] Take 1 tablet by mouth daily. [provider] Taking Active Self  ONETOUCH VERIO test strip [id number] 1 each 3 (three) times daily. [provider] Taking Active   rOPINIRole (REQUIP) 0.25 MG tablet [id number] TAKE 2 TABLETS (0.5 MG TOTAL) BY MOUTH AT BEDTIME. LCarollee Herter YKendrick FriesR, DO Taking Active   sacubitril-valsartan (ENTRESTO) 49-51 MG [id number] Take 1 tablet by mouth 2 (two) times  daily. MLarey Dresser MD Taking Active Self           Med Note ([facility]  Fri May 26, 2022  1:50 PM)    Semaglutide, 1 MG/DOSE, (OZEMPIC, 1 MG/DOSE,) 4 MG/3ML SSempervirens P.H.F.[id number] Inject 1 mg into the skin once a week. [provider] Taking Active            Med Note ([facility]  Fri May 26, 2022  1:50 PM)    VASCEPA 1 g capsule [id number] Take 2 capsules (2 g total) by mouth 2 (two) times daily. MLarey Dresser MD Taking Active             Patient Active Problem List   Diagnosis Date Noted   Diabetic nephropathy ([place] 10/13/2021   Hyperglycemia due to type 2 diabetes mellitus ([place] 10/13/2021  Proteinuria 10/13/2021   Pure hypercholesterolemia 10/13/2021   COVID-19 04/15/2021   Respiratory failure with hypoxia ([place]) 69/67/8938   Systolic CHF ([place]) 05/02/5101   CKD (chronic kidney disease) stage 3, GFR 30-59 ml/min (HCC) 04/15/2021   Elevated troponin 04/15/2021   Acute on chronic systolic (congestive) heart failure ([place]) 09/14/2020   Wide-complex tachycardia 09/07/2020   Ventricular tachyarrhythmia ([place]) 09/07/2020   VT (ventricular tachycardia) ([place]) 09/07/2020   Persistent atrial fibrillation ([place]) 04/30/2018   Wound of left leg 10/01/2017   Cellulitis of right lower extremity 11/27/2016   Encounter for assessment for deep vein thrombosis (DVT) 11/14/2016   Hypokalemia 08/13/2016   Type 2 diabetes mellitus with hyperglycemia, with long-term current use of insulin (HCC)    Atherosclerosis of aorta ([place]) 06/26/2016   Colitis 11/24/2014   Severe obesity (BMI >= 40) ([place]) 11/12/2013   PAF (paroxysmal atrial fibrillation) ([place]) 11/03/2013   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 10/28/2013   COLONIC POLYPS 08/16/2010   PAD (peripheral artery disease) ([place]) 08/15/2010   HLD (hyperlipidemia) 11/18/2009   MYALGIA 11/18/2009   OBESITY 05/14/2007   PULMONARY NODULE, RIGHT MIDDLE LOBE 05/14/2007   Obstructive  sleep apnea 12/06/2006   Essential hypertension 12/06/2006   COPD (chronic obstructive pulmonary disease) ([place]) 12/06/2006    Assessment / Plan:  Medication Assistance:  Delene Loll obtained through [employer]  medication assistance program in 2023 but patient is not sure she has applied for 2024. She will check with cardio office.  Humalog Mix 58/52 application for [id number] is pending - we have submitted provider portion online. Patient will send me the patient portion to send to [facility] program.  Patient was auto enrolled in [place] and Me program and will get [name] thru them until 07/17/2023.  Ozempic - looks like cardio office has submitted [id number] applications. (Patient has 3 boxes of Ozempic '1mg'$  weekly left - then I think Cardio office might increase to '2mg'$  weekly.   Medication management:  Reviewed and updated medication list - updated dose of gabapentin Reviewed refill history and adherence  Type 2 DM / obesity - A1c great / weight has changed a little. Continue Ozmepic '1mg'$  weekly (cardio planning to increase to '2mg'$  weekly when finishes current supply),  Humalog 75/25 mix - 40 units prior to breakfast and 30 units prior to evening meal. Patient to mail patient portion of [employer] application to our office.   Continue to check blood glucose 3 or more times per day  Hyperlipidemia: Last LDL was 94 but this was in 2022.  COntinue Livalo and Vascepa.  Recheck lipids with next labs.   Afib:  Continue Eliquis '5mg'$  twice a day.  Contineu mexiletine '150mg'$  twice a day and amiodarone '200mg'$  0.5 tablet daily for rate control.    CHF: Stable but having some low blood pressure readings Continue  Farxiga, Entresto, Eplerenone, metoprolol.  Patient has message out to cardiology regarding low BPs  - could consider lowering dose of Farxiga or metoprolol. Will await recommendation from cardiology. Continue to weight daily and limit sodium intake.    Follow Up:  Telephone follow up appointment with care  management team member scheduled for:  2 months   [name], PharmD Clinical Pharmacist [place] [phone]

## 2022-08-08 ENCOUNTER — Other Ambulatory Visit (HOSPITAL_COMMUNITY): Payer: Self-pay | Admitting: Cardiology

## 2022-08-13 ENCOUNTER — Telehealth: Payer: Self-pay | Admitting: Physician Assistant

## 2022-08-13 NOTE — Telephone Encounter (Signed)
Pt has not been feeling well, c/o nausea. She also has general malaise.  She took the Ozempic last pm. She had similar sx the last time she took Ozempic.  Discussed situation w/ her.   She is keeping down water >> requested she get some Pedialyte and dilute that. See how she does.  Do not try to eat much today, but keep liquids going in.   Stop the Ozempic for now.   Call back 01/28 if sx do not improve.  Will route this to Dr [name].  Rosaria Ferries, PA-C 08/13/2022 4:08 PM

## 2022-08-21 ENCOUNTER — Other Ambulatory Visit: Payer: Self-pay | Admitting: Cardiology

## 2022-08-21 ENCOUNTER — Telehealth: Payer: Self-pay | Admitting: Pharmacist

## 2022-08-21 DIAGNOSIS — E876 Hypokalemia: Secondary | ICD-10-CM

## 2022-08-21 NOTE — Telephone Encounter (Signed)
Patient called to state that she has a lot of nausea after her last dose of Ozempic '1mg'$  weekly. She states that she vomited for over a week.  She contacted cardiologist office. They recommended Pedia-lyte and that she stop Ozempic.  Instructed patient to continue to monitor blood glucose. Notify office if blood glucose increases to > 200. We might need to adjust Humalog 75/25 mix since stopping Ozempic.

## 2022-08-24 ENCOUNTER — Encounter (HOSPITAL_COMMUNITY): Payer: Self-pay | Admitting: *Deleted

## 2022-08-29 ENCOUNTER — Encounter: Payer: Medicare Other | Admitting: Physician Assistant

## 2022-09-06 ENCOUNTER — Other Ambulatory Visit (HOSPITAL_COMMUNITY): Payer: Self-pay | Admitting: Cardiology

## 2022-09-14 ENCOUNTER — Ambulatory Visit: Payer: Medicare Other

## 2022-09-14 DIAGNOSIS — I480 Paroxysmal atrial fibrillation: Secondary | ICD-10-CM | POA: Diagnosis not present

## 2022-09-14 LAB — CUP PACEART REMOTE DEVICE CHECK
Battery Remaining Longevity: 122 mo
Battery Voltage: 3.01 V
Brady Statistic RV Percent Paced: 0.24 %
Date Time Interrogation Session: 20240229023325
HighPow Impedance: 83 Ohm
Implantable Lead Connection Status: 753985
Implantable Lead Implant Date: 20220302
Implantable Lead Location: 753860
Implantable Pulse Generator Implant Date: 20220302
Lead Channel Impedance Value: 646 Ohm
Lead Channel Impedance Value: 760 Ohm
Lead Channel Pacing Threshold Amplitude: 2.25 V
Lead Channel Pacing Threshold Pulse Width: 0.4 ms
Lead Channel Sensing Intrinsic Amplitude: 8.625 mV
Lead Channel Sensing Intrinsic Amplitude: 8.625 mV
Lead Channel Setting Pacing Amplitude: 4.5 V
Lead Channel Setting Pacing Pulse Width: 0.4 ms
Lead Channel Setting Sensing Sensitivity: 0.3 mV
Zone Setting Status: 755011
Zone Setting Status: 755011

## 2022-09-21 ENCOUNTER — Other Ambulatory Visit (HOSPITAL_COMMUNITY): Payer: Self-pay

## 2022-09-22 NOTE — Progress Notes (Incomplete)
Advanced Heart Failure Clinic Note   PCP:  Ann Held, DO  HF Cardiologist: Dr. [name]  History of Present Illness: Heidi Stark is a 82 y.o. female who has history of HTN, DM, hyperlipidemia.  She was admitted in 4/15 with splenic and renal infarcts.  She was found to have paroxysmal atrial fibrillation.  In 4/15, she had fever and flank pain.  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  TEE showed prominent aortic plaque.  While she was in the hospital, she was noted to have a brief run of atrial fibrillation and was started on coumadin.     She has left leg pain with ambulation after about 100 feet, LE dopplers 5/15 showed occluded left external iliac artery. This has been present ever since she had left iliac damage with a prior back surgery.     She was stable until 1/18, when she developed palpitations and exertional dyspnea.  She was admitted to [facility] with atrial fibrillation/RVR and acute systolic CHF.  EF was found to be 30-35%, down from 50-55% in 2015.  She was diuresed and underwent DCCV back to NSR.  She subsequently had atrial fibrillation ablation in 4/18.  She went back into atrial fibrillation in 5/19 and had DCCV back today NSR.    Echo was done 6/19 and reviewed, EF 55%, mild LVH, moderate diastolic dysfunction, normal RV size and systolic function.    With recurrent atrial fibrillation, she was admitted in 10/19 to start Tikosyn.  She was cardioverted back to NSR.   She had a recent admission in 2/22 with lightheadedness/presyncope that appeared to correlate with runs of VT.  She was on Tikosyn for atrial fibrillation though VT not thought to be due to Tikosyn (not polymorphic, QT not prolonged).  Tikosyn was stopped and amiodarone was started.  Echo in 2/22 showed EF 50-55% but cMRI (2/22) showed EF 40% with subendocardial scar in the basal to mid inferolateral wall, RV EF was 38%.  She had coronary angiography showing  no obstructive disease.  VT subsided and she was sent home on amiodarone.    09/13/20, she had 3 episodes of presyncope in succession while sitting at a desk.  No prodrome, each episode of severe lightheadedness lasted maybe 30 seconds.  She did not pass out. Because of these events, she came to the ER. She was admitted for observation and amiodarone gtt was restarted.  She had no VT overnight.  She remained in atypical atrial flutter. (was in atypical flutter at last admission). She had a single chamber Medtronic ICD placed 09/15/20. Discharge weight 256 lbs.   She had post hospital f/u on 09/22/20 and was noted to be back in atrial flutter w/ CVR. She was set up for repeat DCCV 3 weeks later. S/p DCCV on 10/11/20 and converted back to NSR.   She had COVID-19 in 9/22.   Follow up 09/01/21, she was volume overloaded and instructed to take metolazone 2.5 + extra 40 KCL x 2 days.  Multiple episodes of VT on 09/06/21, received ATP but no shocks. Follow up with EP 09/08/21 and started on mexiletine 250 bid.  On 11/28/21, she had VT with ATP and 1 ICD shock.  She had not taken amiodarone or mexiletine for a couple of days when this occurred.  She saw Dr. [name], meds were not changed.   Echo in 8/23 showed EF 60-65%, moderate LVH, normal RV, normal IVC.  Follow up 11/23, NYHA II-early III. Volume stable. No VT on device interrogation.  Today she returns for HF follow up. Overall feeling fine. Main complaint is shakiness this morning, she attributes this to her low BP. She is physically limited by low back pain, recently diagnosed with 4 ruptured discs. She does not have dyspnea walking around the house with her walker but generally not very active. Denies palpitations, CP, dizziness, edema, or PND/Orthopnea. Appetite ok. No fever or chills. Weight at home 261 pounds. Taking all medications. Wears CPAP at night.  MDT device interrogation: No VT/AF, stable thoracic impedance but OptiVol slowly creeping up, 0  hr/day activity  ECG (personally reviewed): none ordered today.  Labs (5/19): K 3.9, creatinine 0.78 Labs (6/19): K 4.4, creatinine 0.88 Labs (12/19): K 4.2, creatinine 0.85, TGs 322, LDL 77 Labs (2/20): LDL 46, TGs 362 Labs (3/20): K 4.7, creatinine 1.07 Labs (11/20): TGs 282, LDL 92 Labs (3/22): K 4.1, creatinine 1.23 Labs (4/22): K 4.2, creatinine 1.2, LFTs normal, TSH mildly elevated, free T4 mildly elevated, normal T3.  Labs (6/22): TSH mildly elevated, free T3 and T4 normal Labs (7/22): TGs 292, LDL 94 Labs (10/22): K 3.8, creatinine 1.04, BNP 62, hgb 14.1, LFTs normal Labs (2/23): K 3.5, creatinine 1.75, mag 2.5 Labs (3/23): TSH 8.31 Labs (5/23): BNP 542, K 3.8, creatinine 1.7 Labs (8/23): hgb 14.2, K 4, creatinine 1.66 Labs (11/23): normal TSH Labs (12/23): K 3.6, creatinine 1.35, normal LFTs  PMH: 1.Type II diabetes 2. HTN 3. Hyperlipidemia 4. COPD: has quit smoking.  5. CAD: Coronary CTA (4/18) with nonobstructive coronary disease, calcium score 335 Agatston units (82nd percentile).  - Coronary angiography (2/22) with no obstructive disease 6. Paroxysmal atrial fibrillation: First noted in 4/15.  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   - Event monitor (4/15) with no atrial fibrillation.  - Admission with atrial fibrillation/RVR in 1/18.  - Atrial fibrillation ablation in 4/18.  - DCCV in 5/19.  - Tikosyn started + DCCV in 10/19. VT => Tikosyn stopped and amiodarone begun in 3/22.   - S/p single chamber MDT ICD 09/15/20 - DCCV to NSR in 3/22.  7. Chronic systolic CHF: ?tachycardia-mediated as first noted in setting of atrial fibrillation with RVR.  - TEE (4/15) with EF 50-55%, mild LVH, grade III-IV plaque in descending thoracic aorta, RV normal, no PFO, peak RV-RA gradient 42 mmHg.   - Echo (1/18) with EF 30-35%, diffuse hypokinesis, moderate LVH, mildly dilated RV, PASP 55 mmHg.  - Echo (4/18) with EF 50%,  anterolateral/inferolateral mild hypokinesis, mild aortic stenosis, IVC normal.  - Echo (6/19) with EF 55%, mild LVH, moderate diastolic dysfunction, normal RV size and systolic function.  - cMRI (2/22) with EF 40% with subendocardial scar in the basal to mid inferolateral wall, RV EF was 38%. - Echo (2/22) with EF 50-55%, normal RV.  - Echo (8/23): EF 60-65%, moderate LVH, normal RV, normal IVC.  7. H/o diskectomy. 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 Lower extremity arterial dopplers (5/15) with occluded left external iliac artery.  - ABIs (5/18): 1.16 (normal) on right, 0.56 on left.  9. Aortic stenosis: Mild on 4/18 echo.  10. OSA: Uses CPAP 11. VT: 2/22.  No significant CAD on cath in 2/22.  cMRI with EF 40% with subendocardial scar in the basal to mid inferolateral wall, RV EF was 38%. - Medtronic ICD placed 3/22.  12. COVID-19 9/22  Current Outpatient Medications  Medication Sig Dispense Refill   acetaminophen (TYLENOL) 500 MG tablet Take 500 mg by mouth every 6 (six) hours as needed for moderate pain or headache.     amiodarone (PACERONE) 200 MG tablet TAKE 1 TABLET BY MOUTH EVERY DAY (Patient taking differently: Take 100 mg by mouth daily.) 90 tablet 3   apixaban (ELIQUIS) 5 MG TABS tablet Take 1 tablet (5 mg total) by mouth 2 (two) times daily. 60 tablet 11   BD PEN NEEDLE NANO 2ND GEN 32G X 4 MM MISC See admin instructions.     dapagliflozin propanediol (FARXIGA) 10 MG TABS tablet TAKE 1 TABLET BY MOUTH EVERY DAY BEFORE BREAKFAST 90 tablet 3   eplerenone (INSPRA) 25 MG tablet TAKE 1 TABLET BY MOUTH EVERY DAY 90 tablet 3   fenofibrate (TRICOR) 145 MG tablet TAKE 1 TABLET BY MOUTH EVERY DAY 90 tablet 3   furosemide (LASIX) 40 MG tablet Patient takes 2 tablets by mouth in the morning and 2 tablets in the evening.     gabapentin (NEURONTIN) 300 MG capsule Take 300 mg by mouth at bedtime.     HUMALOG MIX 75/25 KWIKPEN (75-25) 100 UNIT/ML KwikPen Take 40 units  with morning meal and 30 units with evening meal 90 mL 2   icosapent Ethyl (VASCEPA) 1 g capsule TAKE 2 CAPSULES BY MOUTH TWICE A DAY 120 capsule 5   KLOR-CON M10 10 MEQ tablet TAKE 2 TABLETS BY MOUTH TWICE A DAY 360 tablet 3   levothyroxine (SYNTHROID) 50 MCG tablet Taking 2  tablets = 118mg Mondays, Wednesdays and Fridays. 1 tablet all other days.     LIVALO 4 MG TABS TAKE 1 TABLET BY MOUTH EVERY DAY 90 tablet 3   metoprolol succinate (TOPROL-XL) 25 MG 24 hr tablet TAKE 1 TABLET BY MOUTH EVERYDAY AT BEDTIME 90 tablet 3   mexiletine (MEXITIL) 150 MG capsule Take 2 capsules (300 mg total) by mouth 2 (two) times daily. 360 capsule 3   Multiple Vitamin (MULTIVITAMIN WITH MINERALS) TABS tablet Take 1 tablet by mouth daily.     ONETOUCH VERIO test strip 1 each 3 (three) times daily.     rOPINIRole (REQUIP) 0.25 MG tablet TAKE 2 TABLETS (0.5 MG TOTAL) BY MOUTH AT BEDTIME. (Patient taking differently: Take 0.5 mg by mouth at bedtime. As needed) 180 tablet 0   sacubitril-valsartan (ENTRESTO) 49-51 MG Take 1 tablet by mouth 2 (two) times daily. 60 tablet 3   No current facility-administered medications for this encounter.    Allergies:   Neomycin-bacitracin zn-polymyx, Sulfonamide derivatives, Sulfa antibiotics, Ciprofloxacin, and Spironolactone   Social History:  The patient  reports that she quit smoking about 2 years ago. Her smoking use included cigarettes. She has a 50.00 pack-year smoking history. She has never used smokeless tobacco. She reports that she does not drink alcohol and does not use drugs.   Family History:  The patient's family history includes Cancer in her mother; Diabetes in her father, mother, and another family member; Factor V Leiden deficiency in her daughter; Hypertension in her mother; Melanoma in an other family member; Obesity in her father and mother.   ROS:  Please see the history of present illness.   All other systems are personally reviewed and negative.   BP 92/60    Pulse 62   Wt 118.4 kg (261 lb) Comment: Patient stated she was weighed at home this morning  SpO2 94%   BMI 42.13 kg/m   Wt Readings from Last 3 Encounters:  09/25/22 118.4 kg (261 lb)  08/01/22 122 kg (269 lb)  03/14/22 121.6 kg (268 lb)   PHYSICAL EXAM: General:  NAD. No resp difficulty, arrived in [facility] HEENT: Normal Neck: Supple. No JVD. Carotids 2+ bilat; no bruits. No lymphadenopathy or thryomegaly appreciated. Cor: PMI nondisplaced. Regular rate & rhythm. No rubs, gallops or murmurs. Lungs: Clear Abdomen: Soft, obese, nontender, nondistended. No hepatosplenomegaly. No bruits or masses. Good bowel sounds. Extremities: No cyanosis, clubbing, rash, edema Neuro: Alert & oriented x 3, cranial nerves grossly intact. Moves all 4 extremities w/o difficulty. Affect pleasant.   Recent Labs: 12/06/2021: Magnesium 2.3; NT-Pro BNP 542 05/26/2022: B Natriuretic Peptide 92.6; Hemoglobin 15.0; Platelets 191; TSH 3.225 06/27/2022: ALT 21; BUN 20; Creatinine, Ser 1.35; Potassium 3.6; Sodium 139  Personally reviewed   ASSESSMENT AND PLAN: 1. H/o VT: noted 08/2020. 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 Had been on Tikosyn but this was not the typical Tikosyn-induced polymorphic VT and she had had no changes in Tikosyn dosing prior to occurrence. cMRI showed subendocardial LGE in the basal to mid inferolateral wall. This appeared to be a coronary disease pattern however coronary angiogram did not show significant coronary disease.  She was transitioned from Tikosyn to amiodarone and underwent Medtronic single chamber ICD on 09/15/20. VT on 09/06/21 and was started on mexiletine.  VT again on 11/28/21 after missing amiodarone and mexiletine. No VT since then on device interrogation.  - Continue amiodarone 100 mg daily (recently reduced with mildly elevated LFTs).  LFTs 12/23 stable, and TSH up to date.  She will need regular eye exam.  - Continue mexiletine 300 mg bid. 2. Atrial  fibrillation/flutter: Paroxysmal.  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 She was admitted in 1/18 with atrial fibrillation/RVR and CHF.  She was diuresed and had DCCV back to NSR. She had atrial fibrillation ablation in 4/18.  In 5/19, she went into atrial fibrillation transiently.  In 10/19, she was back in atrial fibrillation and was admitted for Tikosyn initiation and cardioversion.  Atypical atrial flutter noted in 12/21 with DCCV to NSR. As outlined above, now off Tikosyn and on amiodarone. Underwent repeat DCCV in 3/22. Regular on exam today. No recent AF on device interrogation. - Continue amiodarone. - Continue Eliquis 5 mg bid. No bleeding issues. 3. Claudication: Left leg claudication with absent left PT pulse.  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   4. Chronic systolic => diastolic CHF: EF 99991111 on echo in 1/18, down from 50-55% in 4/15.  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 In 2/22, cMRI showed LV EF 40% with RV E 38%, inferolateral subendocardial LGE, but echo in 2/22 showed EF 50-55%.  Echo in 8/23 showed EF 60-65%, moderate LVH, normal RV, normal IVC.  Stable NYHA II-early III, functional status limited by body habitus and chronic back pain. She is not volume overloaded by exam, but OptiVol creeping up.  - With symptomatic low BP, decrease Entresto to 24/26 mg bid.    BMET/BNP today - Continue Lasix 80 mg bid + 20 KCL bid, she can take extra 40 mg Lasix x 3 days. Consider switch to torsemide next if she has issues with volume overload. - Continue Toprol XL 25 mg daily.   - Continue eplerenone 25 mg daily.  - Continue dapagliflozin 10 mg daily.  -  Will ask device RN to send interrogation in 7-10 days to ensure OptiVol remains stable on lower dose of Entresto. 5. CAD: Coronary CTA in 2018 showed  coronary calcium score in the 82nd percentile with nonobstructive coronary disease. Cath in 2/22 showed only luminal irregularities. She denies anginal symptoms.  - Not on ASA due to Eliquis.  - Continue Vascepa and Livalo.  6. COPD:  History of smoking. Quit 2/22.  7. OSA: Continue CPAP.  8. Obesity: Body mass index is 42.13 kg/m. - Unable to tolerate semaglutide due to nausea. 9. CKD: Stage 3. BMET today.  Follow up in 4 months with Dr. [name]  Signed, Rafael Bihari, [place]  09/25/2022  [place] [address] [facility] [place] [address] [phone] (office) [phone] (fax)

## 2022-09-25 ENCOUNTER — Encounter (HOSPITAL_COMMUNITY): Payer: Self-pay

## 2022-09-25 ENCOUNTER — Ambulatory Visit (HOSPITAL_COMMUNITY)
Admission: RE | Admit: 2022-09-25 | Discharge: 2022-09-25 | Disposition: A | Discharge status: Home / Self Care | Payer: Medicare Other | Source: Ambulatory Visit | Attending: Family Medicine | Admitting: Family Medicine

## 2022-09-25 VITALS — BP 92/60 | HR 62 | Wt 261.0 lb

## 2022-09-25 DIAGNOSIS — I480 Paroxysmal atrial fibrillation: Secondary | ICD-10-CM | POA: Insufficient documentation

## 2022-09-25 DIAGNOSIS — N183 Chronic kidney disease, stage 3 unspecified: Secondary | ICD-10-CM | POA: Diagnosis not present

## 2022-09-25 DIAGNOSIS — Z95810 Presence of automatic (implantable) cardiac defibrillator: Secondary | ICD-10-CM | POA: Insufficient documentation

## 2022-09-25 DIAGNOSIS — Z7984 Long term (current) use of oral hypoglycemic drugs: Secondary | ICD-10-CM | POA: Insufficient documentation

## 2022-09-25 DIAGNOSIS — G8929 Other chronic pain: Secondary | ICD-10-CM | POA: Diagnosis not present

## 2022-09-25 DIAGNOSIS — I2510 Atherosclerotic heart disease of native coronary artery without angina pectoris: Secondary | ICD-10-CM | POA: Diagnosis not present

## 2022-09-25 DIAGNOSIS — I472 Ventricular tachycardia, unspecified: Secondary | ICD-10-CM

## 2022-09-25 DIAGNOSIS — I739 Peripheral vascular disease, unspecified: Secondary | ICD-10-CM

## 2022-09-25 DIAGNOSIS — Z87891 Personal history of nicotine dependence: Secondary | ICD-10-CM | POA: Insufficient documentation

## 2022-09-25 DIAGNOSIS — E1151 Type 2 diabetes mellitus with diabetic peripheral angiopathy without gangrene: Secondary | ICD-10-CM | POA: Insufficient documentation

## 2022-09-25 DIAGNOSIS — J449 Chronic obstructive pulmonary disease, unspecified: Secondary | ICD-10-CM | POA: Diagnosis not present

## 2022-09-25 DIAGNOSIS — G4733 Obstructive sleep apnea (adult) (pediatric): Secondary | ICD-10-CM | POA: Diagnosis not present

## 2022-09-25 DIAGNOSIS — Z8616 Personal history of COVID-19: Secondary | ICD-10-CM | POA: Insufficient documentation

## 2022-09-25 DIAGNOSIS — I484 Atypical atrial flutter: Secondary | ICD-10-CM | POA: Insufficient documentation

## 2022-09-25 DIAGNOSIS — E669 Obesity, unspecified: Secondary | ICD-10-CM | POA: Diagnosis not present

## 2022-09-25 DIAGNOSIS — Z79899 Other long term (current) drug therapy: Secondary | ICD-10-CM | POA: Insufficient documentation

## 2022-09-25 DIAGNOSIS — E1122 Type 2 diabetes mellitus with diabetic chronic kidney disease: Secondary | ICD-10-CM | POA: Insufficient documentation

## 2022-09-25 DIAGNOSIS — I130 Hypertensive heart and chronic kidney disease with heart failure and stage 1 through stage 4 chronic kidney disease, or unspecified chronic kidney disease: Secondary | ICD-10-CM | POA: Insufficient documentation

## 2022-09-25 DIAGNOSIS — I5032 Chronic diastolic (congestive) heart failure: Secondary | ICD-10-CM | POA: Insufficient documentation

## 2022-09-25 DIAGNOSIS — Z6841 Body Mass Index (BMI) 40.0 and over, adult: Secondary | ICD-10-CM | POA: Diagnosis not present

## 2022-09-25 DIAGNOSIS — M549 Dorsalgia, unspecified: Secondary | ICD-10-CM | POA: Insufficient documentation

## 2022-09-25 DIAGNOSIS — Z7901 Long term (current) use of anticoagulants: Secondary | ICD-10-CM | POA: Insufficient documentation

## 2022-09-25 LAB — BRAIN NATRIURETIC PEPTIDE: B Natriuretic Peptide: 91.8 pg/mL (ref 0.0–100.0)

## 2022-09-25 LAB — BASIC METABOLIC PANEL
Anion gap: 11 (ref 5–15)
BUN: 31 mg/dL — ABNORMAL HIGH (ref 8–23)
CO2: 24 mmol/L (ref 22–32)
Calcium: 8.3 mg/dL — ABNORMAL LOW (ref 8.9–10.3)
Chloride: 100 mmol/L (ref 98–111)
Creatinine, Ser: 1.51 mg/dL — ABNORMAL HIGH (ref 0.44–1.00)
GFR, Estimated: 35 mL/min — ABNORMAL LOW (ref >60)
Glucose, Bld: 91 mg/dL (ref 70–99)
Potassium: 4 mmol/L (ref 3.5–5.1)
Sodium: 135 mmol/L (ref 135–145)

## 2022-09-25 MED ORDER — ENTRESTO 24-26 MG PO TABS: 1.0000 | ORAL_TABLET | Freq: Two times a day (BID) | ORAL | 3 refills | Status: DC

## 2022-09-25 NOTE — Patient Instructions (Signed)
Labs done today. We will contact you only if your labs are abnormal.  DECREASE Entresto to 24-'26mg'$  (1 tablet) by mouth 2 times daily.   TAKE AN EXTRA LASIX '40MG'$  (2 TABLETS) BY MOUTH FOR 3 DAYS  No other medication changes were made. Please continue all current medications as prescribed.  Your physician recommends that you schedule a follow-up appointment in: 4 months with Dr. [name]. Please contact our office in May to schedule a July appointment.   If you have any questions or concerns before your next appointment please send [name] a message through [place] or call our office at [phone].    TO LEAVE A MESSAGE FOR THE NURSE SELECT OPTION 2, PLEASE LEAVE A MESSAGE INCLUDING: YOUR NAME DATE OF BIRTH CALL BACK NUMBER REASON FOR CALL**this is important as we prioritize the call backs  YOU WILL RECEIVE A CALL BACK THE SAME DAY AS LONG AS YOU CALL BEFORE 4:00 PM   Do the following things EVERYDAY: Weigh yourself in the morning before breakfast. Write it down and keep it in a log. Take your medicines as prescribed Eat low salt foods--Limit salt (sodium) to 2000 mg per day.  Stay as active as you can everyday Limit all fluids for the day to less than 2 liters   At the [facility], you and your health needs are our priority. As part of our continuing mission to provide you with exceptional heart care, we have created designated Provider Care Teams. These Care Teams include your primary Cardiologist (physician) and Advanced Practice Providers (APPs- Physician Assistants and Nurse Practitioners) who all work together to provide you with the care you need, when you need it.   You may see any of the following providers on your designated Care Team at your next follow up: Dr Glori Bickers Dr Haynes Kerns, NP Lyda Jester, [place] Audry Riles, PharmD   Please be sure to bring in all your medications bottles to every appointment.

## 2022-09-25 NOTE — Addendum Note (Signed)
Encounter addended by: Rafael Bihari, FNP on: 09/25/2022 4:22 PM  Actions taken: Clinical Note Signed

## 2022-10-04 ENCOUNTER — Ambulatory Visit: Payer: Medicare Other | Admitting: Pharmacist

## 2022-10-04 VITALS — BP 116/72 | HR 64

## 2022-10-04 DIAGNOSIS — E1165 Type 2 diabetes mellitus with hyperglycemia: Secondary | ICD-10-CM

## 2022-10-04 DIAGNOSIS — Z794 Long term (current) use of insulin: Secondary | ICD-10-CM

## 2022-10-04 DIAGNOSIS — I5020 Unspecified systolic (congestive) heart failure: Secondary | ICD-10-CM

## 2022-10-04 DIAGNOSIS — I480 Paroxysmal atrial fibrillation: Secondary | ICD-10-CM

## 2022-10-04 DIAGNOSIS — I10 Essential (primary) hypertension: Secondary | ICD-10-CM

## 2022-10-04 NOTE — Progress Notes (Signed)
Pharmacy Note  10/04/2022 Name: Heidi Stark MRN: [id number] DOB: 06-23-1941  Subjective: Heidi Stark is a 82 y.o. year old female who is a primary care patient of [name], [name], DO. Clinical Pharmacist Practitioner referral was placed to assist with medication management.    Engaged with patient by telephone for follow up visit today.  Type 2 DM / obesity:  Sees Dr [name] - endo Current therapy: Humalog 75/25 - inject 40 units before morning meal and 20 units before evening meal. Farxiga 10mg  daily.  Previously tried Ozempic - was up to 1mg  weekly but stopped due to nausea x 5 days.   Home blood glucose: morning - 110 to 125 and pm - 140's  Patient is tolerating medications well. Denies hypoglycemia.   Wt Readings from Last 3 Encounters:  09/25/22 261 lb (118.4 kg)  08/01/22 269 lb (122 kg)  03/14/22 268 lb (121.6 kg)    Hyperlipidemia: Last LDL was 94 but this was in 2022. Tolerating Livalo well. Patient's icosapent was changed to Vascepa 06/21/2022 by Cardio team due to icosapent ethyl not being on 2024 formulary.   Afib:  CHADSVasc = 7; Taking Eliquis for anticoagulation.  Taking mexiletine 150mg  twice a day and amiodarone 200mg  0.5 tablet daily for rate control.    CHF:  Sees cardio team in advanced heart failure clinic.  Current therapy: [name], Eplerenone, metoprolol.  Dose of Entresto was lowered to 24/26mg  twice a day 09/25/2022 due to low blood pressure.   Blood pressure at home today was 116/72 She weights daily and limits sodium intake.   Objective: Review of patient status, including review of consultants reports, laboratory and other test data, was performed as part of comprehensive.  Lab Results  Component Value Date   CREATININE 1.51 (H) 09/25/2022   CREATININE 1.35 (H) 06/27/2022   CREATININE 1.44 (H) 05/26/2022    Lab Results  Component Value Date   HGBA1C 6.0 12/21/2021       Component Value Date/Time   CHOL 186  01/27/2021 1146   CHOL 170 06/02/2019 0913   TRIG 292 (H) 01/27/2021 1146   HDL 34 (L) 01/27/2021 1146   HDL 31 (L) 06/02/2019 0913   CHOLHDL 5.5 01/27/2021 1146   VLDL 58 (H) 01/27/2021 1146   LDLCALC 94 01/27/2021 1146   LDLCALC 92 06/02/2019 0913   LDLCALC 77 06/28/2018 1410   LDLDIRECT 42.0 11/30/2014 1226     Clinical ASCVD: Yes  The ASCVD Risk score (Arnett DK, et al., 2019) failed to calculate for the following reasons:   The 2019 ASCVD risk score is only valid for ages 13 to 67    BP Readings from Last 3 Encounters:  10/04/22 116/72  09/25/22 92/60  08/01/22 (!) 94/58     Allergies  Allergen Reactions   Neomycin-Bacitracin Zn-Polymyx Rash   Sulfonamide Derivatives Other (See Comments)    Stomach cramps   Sulfa Antibiotics Other (See Comments)   Ciprofloxacin Hives, Itching and Rash    Pt doesn't remember having any reaction to cipro   Spironolactone Rash    Medications Reviewed Today     Reviewed by [name], RPH-CPP (Pharmacist) on 10/04/22 at 1325  Med List Status: <None>   Medication Order Taking? Sig Documenting Provider Last Dose Status Informant  acetaminophen (TYLENOL) 500 MG tablet [id number] Yes Take 500 mg by mouth every 6 (six) hours as needed for moderate pain or headache. [provider] Taking Active Self  amiodarone (PACERONE) 200  MG tablet [id number] Yes TAKE 1 TABLET BY MOUTH EVERY DAY  Patient taking differently: Take 100 mg by mouth daily.   Larey Dresser, MD Taking Active   apixaban (ELIQUIS) 5 MG TABS tablet [id number] Yes Take 1 tablet (5 mg total) by mouth 2 (two) times daily. Larey Dresser, MD Taking Active   BD PEN NEEDLE NANO 2ND GEN 32G X 4 MM MISC [id number] Yes See admin instructions. [provider] Taking Active   dapagliflozin propanediol (FARXIGA) 10 MG TABS tablet [id number] Yes TAKE 1 TABLET BY MOUTH EVERY DAY BEFORE BREAKFAST [name], [name], DO Taking Active   eplerenone (INSPRA) 25 MG tablet  [id number] Yes TAKE 1 TABLET BY MOUTH EVERY DAY Larey Dresser, MD Taking Active   fenofibrate (TRICOR) 145 MG tablet [id number] Yes TAKE 1 TABLET BY MOUTH EVERY DAY Larey Dresser, MD Taking Active   furosemide (LASIX) 40 MG tablet [id number] Yes Patient takes 2 tablets by mouth in the morning and 2 tablets in the evening. [provider] Taking Active   gabapentin (NEURONTIN) 300 MG capsule [id number] Yes Take 300 mg by mouth at bedtime. [provider] Taking Active   HUMALOG MIX 75/25 KWIKPEN (75-25) 100 UNIT/ML KwikPen [id number] Yes Take 40 units with morning meal and 30 units with evening meal [name], Kendrick Fries R, DO Taking Active   icosapent Ethyl (VASCEPA) 1 g capsule [id number] Yes TAKE 2 CAPSULES BY MOUTH TWICE A DAY Larey Dresser, MD Taking Active   KLOR-CON M10 10 MEQ tablet [id number] Yes TAKE 2 TABLETS BY MOUTH TWICE A DAY Larey Dresser, MD Taking Active   levothyroxine (SYNTHROID) 50 MCG tablet [id number] Yes Taking 2  tablets = 181mcg Mondays, Wednesdays and Fridays. 1 tablet all other days. [provider] Taking Active   LIVALO 4 MG TABS [id number] Yes TAKE 1 TABLET BY MOUTH EVERY DAY Larey Dresser, MD Taking Active   metoprolol succinate (TOPROL-XL) 25 MG 24 hr tablet [id number] Yes TAKE 1 TABLET BY MOUTH EVERYDAY AT BEDTIME Larey Dresser, MD Taking Active   mexiletine (MEXITIL) 150 MG capsule [id number] Yes Take 2 capsules (300 mg total) by mouth 2 (two) times daily. Constance Haw, MD Taking Active   Multiple Vitamin (MULTIVITAMIN WITH MINERALS) TABS tablet [id number] Yes Take 1 tablet by mouth daily. [provider] Taking Active Self  ONETOUCH VERIO test strip [id number] Yes 1 each 3 (three) times daily. [provider] Taking Active   rOPINIRole (REQUIP) 0.25 MG tablet [id number] Yes TAKE 2 TABLETS (0.5 MG TOTAL) BY MOUTH AT BEDTIME.  Patient taking differently: Take 0.5 mg by mouth at bedtime. As needed   [name],  [name], DO Taking Active   sacubitril-valsartan (ENTRESTO) 24-26 MG [id number] Yes Take 1 tablet by mouth 2 (two) times daily. Rafael Bihari, Soledad Taking Active             Patient Active Problem List   Diagnosis Date Noted   Diabetic nephropathy ([place]) 10/13/2021   Hyperglycemia due to type 2 diabetes mellitus ([place]) 10/13/2021   Proteinuria 10/13/2021   Pure hypercholesterolemia 10/13/2021   COVID-19 04/15/2021   Respiratory failure with hypoxia ([place]) [id number]   Systolic CHF ([place]) [id number]   CKD (chronic kidney disease) stage 3, GFR 30-59 ml/min (HCC) 04/15/2021   Elevated troponin 04/15/2021   Acute on chronic systolic (congestive) heart failure ([place]) 09/14/2020   Wide-complex tachycardia 09/07/2020   Ventricular tachyarrhythmia ([place]) 09/07/2020   VT (ventricular tachycardia) ([place])  09/07/2020   Persistent atrial fibrillation ([place]) 04/30/2018   Wound of left leg 10/01/2017   Cellulitis of right lower extremity 11/27/2016   Encounter for assessment for deep vein thrombosis (DVT) 11/14/2016   Hypokalemia 08/13/2016   Type 2 diabetes mellitus with hyperglycemia, with long-term current use of insulin (HCC)    Atherosclerosis of aorta ([place]) 06/26/2016   Colitis 11/24/2014   Severe obesity (BMI >= 40) ([place]) 11/12/2013   PAF (paroxysmal atrial fibrillation) ([place]) 11/03/2013   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 Patient has not applied for 2024 yet because she has not  completed 2023 tax return to send for income verifications.   Patient was auto enrolled in [place] [employer] program and will get [name] thru them until 07/17/2023.  Patient reports she still has 4 or 5 boxes of Humalog 75/25 pens - no medication assistance program needed currently   Medication management:  Reviewed and updated medication list  Reviewed refill history and adherence  Type 2 DM / obesity - A1c great / weight decreased about 10 lbs.  Continue Humalog 75/25 mix - 40 units prior to breakfast and 30 units prior to evening meal and Farxiga   Continue to check blood glucose twice a day Continue to follow up with Dr [name]  Hyperlipidemia: Last LDL was 94 but this was in 2022.  Continue Livalo and Vascepa.  Recheck lipids with next labs.   Afib:  Continue Eliquis 5mg  twice a day.  Contineu mexiletine 150mg  twice a day and amiodarone 200mg  0.5 tablet daily for rate control.    CHF: Stable with improved blood pressure with lower Entresto dose.  Continue  [name], Eplerenone, metoprolol.   Continue to weight daily and limit sodium intake.    Follow Up:  Telephone follow up appointment with care management team member scheduled for:  3 months   [name], PharmD Clinical Pharmacist [place] [phone]

## 2022-10-06 ENCOUNTER — Ambulatory Visit: Payer: Medicare Other | Attending: Cardiology

## 2022-10-06 ENCOUNTER — Telehealth: Payer: Self-pay

## 2022-10-06 ENCOUNTER — Other Ambulatory Visit (HOSPITAL_COMMUNITY): Payer: Self-pay | Admitting: Family Medicine

## 2022-10-06 DIAGNOSIS — I5032 Chronic diastolic (congestive) heart failure: Secondary | ICD-10-CM | POA: Diagnosis not present

## 2022-10-06 DIAGNOSIS — Z95810 Presence of automatic (implantable) cardiac defibrillator: Secondary | ICD-10-CM

## 2022-10-06 NOTE — Progress Notes (Signed)
EPIC Encounter for ICM Monitoring  Patient Name: Heidi Stark is a 82 y.o. female Date: 10/06/2022 Primary Care Physican: Ann Held, DO Primary Cardiologist: [name] Electrophysiologist: [name] 09/25/2022 Office Weight: 261 lbs        ICM check for HF clinic following 3/11 OV with [facility].  Attempted call to patient and unable to reach.  Transmission reviewed.    Optivol thoracic impedance normal after given the recommendation  by Allena Katz, NP at 3/11 OV to take extra 40 mg Lasix x 3 days.   Prescribed:  Furosemide 40 mg take 2 tablet(s) (80 mg total) by mouth twice a day. Potassium 10 mEq take 2 tablet(s) (20 mEq total) by mouth twice a day.  Recommendations:  Copy sent to Allena Katz, NP at [facility] clinic for review following 3/11 OV.    Follow-up plan: No further ICM clinic phone appointments.   91 day device clinic remote transmission 12/14/2022.    EP/Cardiology Office Visits: Recall 11/18/2022 with Dr. Marlou Porch.  Recall 01/24/2023 with Dr [name].  Recall 01/29/2023 with Dr [name].    Copy of ICM check sent to Dr. [name].   3 month ICM trend: 10/05/2022.    12-14 Month ICM trend:     Rosalene Billings, RN 10/06/2022 8:20 AM

## 2022-10-06 NOTE — Telephone Encounter (Signed)
Remote ICM transmission received.  Attempted call to patient regarding ICM remote transmission and no answer.  

## 2022-10-08 ENCOUNTER — Other Ambulatory Visit: Payer: Self-pay | Admitting: Family Medicine

## 2022-10-12 ENCOUNTER — Encounter: Payer: Self-pay | Admitting: Cardiology

## 2022-10-13 NOTE — Progress Notes (Signed)
Remote ICD transmission.   

## 2022-10-20 ENCOUNTER — Telehealth: Payer: Self-pay | Admitting: Family Medicine

## 2022-10-20 NOTE — Telephone Encounter (Signed)
Copied from CRM [id number]. Topic: Medicare AWV >> Oct 20, 2022 11:01 AM Payton Doughty wrote: Reason for CRM: Called patient to schedule Medicare Annual Wellness Visit (AWV). No voicemail available to leave a message.  Last date of AWV: 10/13/21  Please schedule an appointment at any time with Kandis Cocking, LPN .  If any questions, please contact me.  Thank you ,  Verlee Rossetti; Care Guide Ambulatory Clinical Support [facility] Direct Dial: [phone]

## 2022-10-25 ENCOUNTER — Telehealth (HOSPITAL_COMMUNITY): Payer: Self-pay

## 2022-10-25 ENCOUNTER — Other Ambulatory Visit: Payer: Self-pay

## 2022-10-25 ENCOUNTER — Encounter (HOSPITAL_COMMUNITY): Payer: Self-pay | Admitting: Cardiology

## 2022-10-25 DIAGNOSIS — I739 Peripheral vascular disease, unspecified: Secondary | ICD-10-CM

## 2022-10-25 NOTE — Telephone Encounter (Signed)
Spoke with patient and explained HF clinic would like to review remote transmission fluid levels.  Assisted patient and daughter with sending remote transmission.    10/25/2022 Optivol thoracic impedance suggesting possible fluid accumulation starting 3/27 and returning to baseline 4/9.      Report appears to show patient is out of NSR starting 4/4.    Since 21-Oct-2022 AF   43 episodes Time in AF  18.2 hr/day (76.0%)  Advised patient and daughter will send transmission results to Prince Rome NP for review and HF clinic will call her with recommendations if needed.

## 2022-10-25 NOTE — Telephone Encounter (Signed)
Can you log into [facility] to review transmission, Shanda Bumps?

## 2022-10-25 NOTE — Telephone Encounter (Signed)
I spoke to [place] and updated her on medication changes. She verbalized understanding. Follow up visit scheduled.

## 2022-10-25 NOTE — Telephone Encounter (Signed)
Patient called complaining of extreme fatigue and some nausea. She denies any shob, chest pain, edema, or weight gain. Randon Goldsmith is pulling an ICD report as well just to check on that due to fatigue.

## 2022-10-26 ENCOUNTER — Encounter (HOSPITAL_COMMUNITY): Payer: Medicare Other

## 2022-10-26 ENCOUNTER — Encounter: Payer: Medicare Other | Admitting: Vascular Surgery

## 2022-11-03 ENCOUNTER — Encounter: Payer: Self-pay | Admitting: Vascular Surgery

## 2022-11-03 ENCOUNTER — Ambulatory Visit (HOSPITAL_COMMUNITY)
Admission: RE | Admit: 2022-11-03 | Discharge: 2022-11-03 | Disposition: A | Discharge status: Home / Self Care | Payer: Medicare Other | Source: Ambulatory Visit | Attending: Vascular Surgery | Admitting: Vascular Surgery

## 2022-11-03 ENCOUNTER — Ambulatory Visit: Payer: Medicare Other | Admitting: Vascular Surgery

## 2022-11-03 VITALS — BP 132/77 | HR 55 | Temp 97.6°F | Resp 14 | Ht 66.0 in | Wt 250.0 lb

## 2022-11-03 DIAGNOSIS — I739 Peripheral vascular disease, unspecified: Secondary | ICD-10-CM | POA: Diagnosis present

## 2022-11-03 DIAGNOSIS — I7025 Atherosclerosis of native arteries of other extremities with ulceration: Secondary | ICD-10-CM

## 2022-11-03 LAB — VAS US ABI WITH/WO TBI
Left ABI: 0.48
Right ABI: 1.12

## 2022-11-03 NOTE — Progress Notes (Signed)
Patient ID: Heidi Stark, female   DOB: 1941/01/20, 82 y.o.   MRN: [id number]  Reason for Consult: New Patient (Initial Visit) (Left 3rd toe infected)   Referred by Jodi Geralds, MD  Subjective:     HPI:  Heidi Stark is a 82 y.o. female has a remote history of iliac artery injury during spinal surgery repaired by Dr. Olga Millers in our office.  She is followed for heart failure and also irregular rhythm and is maintained on blood thinners.  She was last seen in our office 5 years ago for concern of venous disease.  More recently she has developed a wound on the left middle toe which is healed but she still has discoloration she has a superficial wound on the left leg with associated swelling no cellulitis present.  She is now here for arterial evaluation.  Past Medical History:  Diagnosis Date   Arthritis    "hands, wrists" (11/07/2016)   CHF (congestive heart failure)    Chronic lower back pain    Constipation    DVT (deep venous thrombosis)    History of blood transfusion 1990   "related to OR"   History of kidney stones    Hyperlipidemia    Hypertension    Joint pain    Lower extremity edema    OSA on CPAP    Persistent atrial fibrillation    Pneumonia    "several times" (11/07/2016)   Type II diabetes mellitus    Family History  Problem Relation Age of Onset   Diabetes Mother    Hypertension Mother    Cancer Mother    Obesity Mother    Diabetes Father    Obesity Father    Diabetes Other    Melanoma Other    Factor V Leiden deficiency Daughter    Past Surgical History:  Procedure Laterality Date   ATRIAL FIBRILLATION ABLATION N/A 11/07/2016   Procedure: Atrial Fibrillation Ablation;  Surgeon: Will Jorja Loa, MD;  Location: [facility] INVASIVE CV LAB;  Service: Cardiovascular;  Laterality: N/A;   BACK SURGERY     CARDIAC CATHETERIZATION     CARDIOVERSION N/A 08/11/2016   Procedure: CARDIOVERSION;  Surgeon: Laurey Morale, MD;  Location: [facility] ENDOSCOPY;  Service:  Cardiovascular;  Laterality: N/A;   CARDIOVERSION N/A 12/03/2017   Procedure: CARDIOVERSION;  Surgeon: Laurey Morale, MD;  Location: [facility] ENDOSCOPY;  Service: Cardiovascular;  Laterality: N/A;   CARDIOVERSION N/A 05/02/2018   Procedure: CARDIOVERSION;  Surgeon: Chrystie Nose, MD;  Location: [facility] ENDOSCOPY;  Service: Cardiovascular;  Laterality: N/A;   CARDIOVERSION N/A 06/23/2020   Procedure: CARDIOVERSION;  Surgeon: Meriam Sprague, MD;  Location: [facility] ENDOSCOPY;  Service: Cardiovascular;  Laterality: N/A;   CARDIOVERSION N/A 10/11/2020   Procedure: CARDIOVERSION;  Surgeon: Laurey Morale, MD;  Location: [facility] ENDOSCOPY;  Service: Cardiovascular;  Laterality: N/A;   CARPAL TUNNEL RELEASE     CATARACT EXTRACTION W/ INTRAOCULAR LENS  IMPLANT, BILATERAL Bilateral    COLON SURGERY  1990   vein graft and colon repair after nicked artery with back surgert   COLONOSCOPY     CORONARY ANGIOGRAPHY N/A 09/08/2020   Procedure: CORONARY ANGIOGRAPHY;  Surgeon: Laurey Morale, MD;  Location[facility] INVASIVE CV LAB;  Service: Cardiovascular;  Laterality: N/A;   CYSTECTOMY     between bladder and kidneys   GANGLION CYST EXCISION Left    ICD IMPLANT N/A 09/15/2020   Procedure: ICD IMPLANT;  Surgeon: Regan Lemming, MD;  Location:  [facility] INVASIVE CV LAB;  Service: Cardiovascular;  Laterality: N/A;   KNEE CARTILAGE SURGERY Right 1960s   LUMBAR DISC SURGERY  1990   REPAIR ILIAC ARTERY  1990   vein graft and colon repair after nicked artery with back surgert   TEE WITHOUT CARDIOVERSION N/A 10/31/2013   Procedure: TRANSESOPHAGEAL ECHOCARDIOGRAM (TEE);  Surgeon: Laurey Morale, MD;  Location: [facility] ENDOSCOPY;  Service: Cardiovascular;  Laterality: N/A;   TUBAL LIGATION      Short Social History:  Social History   Tobacco Use   Smoking status: Former    Packs/day: 1.00    Years: 50.00    Additional pack years: 0.00    Total pack years: 50.00    Types: Cigarettes    Quit date: 08/17/2020    Years since  quitting: 2.2   Smokeless tobacco: Never   Tobacco comments:    Quit 2022. [place]  Substance Use Topics   Alcohol use: No    Alcohol/week: 0.0 standard drinks of alcohol    Allergies  Allergen Reactions   Neomycin-Bacitracin Zn-Polymyx Rash   Sulfonamide Derivatives Other (See Comments)    Stomach cramps   Sulfa Antibiotics Other (See Comments)   Ciprofloxacin Hives, Itching and Rash    Pt doesn't remember having any reaction to cipro   Spironolactone Rash    Current Outpatient Medications  Medication Sig Dispense Refill   acetaminophen (TYLENOL) 500 MG tablet Take 500 mg by mouth every 6 (six) hours as needed for moderate pain or headache.     amiodarone (PACERONE) 200 MG tablet TAKE 1 TABLET BY MOUTH EVERY DAY (Patient taking differently: Take 100 mg by mouth daily.) 90 tablet 3   apixaban (ELIQUIS) 5 MG TABS tablet Take 1 tablet (5 mg total) by mouth 2 (two) times daily. 60 tablet 11   BD PEN NEEDLE NANO 2ND GEN 32G X 4 MM MISC See admin instructions.     dapagliflozin propanediol (FARXIGA) 10 MG TABS tablet TAKE 1 TABLET BY MOUTH EVERY DAY BEFORE BREAKFAST 90 tablet 3   eplerenone (INSPRA) 25 MG tablet TAKE 1 TABLET BY MOUTH EVERY DAY 90 tablet 3   fenofibrate (TRICOR) 145 MG tablet TAKE 1 TABLET BY MOUTH EVERY DAY 90 tablet 3   furosemide (LASIX) 40 MG tablet Patient takes 2 tablets by mouth in the morning and 2 tablets in the evening.     gabapentin (NEURONTIN) 300 MG capsule Take 300 mg by mouth at bedtime.     HUMALOG MIX 75/25 KWIKPEN (75-25) 100 UNIT/ML KwikPen Take 40 units with morning meal and 30 units with evening meal 90 mL 2   icosapent Ethyl (VASCEPA) 1 g capsule TAKE 2 CAPSULES BY MOUTH TWICE A DAY 120 capsule 5   KLOR-CON M10 10 MEQ tablet TAKE 2 TABLETS BY MOUTH TWICE A DAY 360 tablet 3   levothyroxine (SYNTHROID) 50 MCG tablet Taking 2  tablets = Mondays, Wednesdays and Fridays. 1 tablet all other days.     LIVALO 4 MG TABS TAKE 1 TABLET BY MOUTH EVERY DAY  90 tablet 3   metoprolol succinate (TOPROL-XL) 25 MG 24 hr tablet TAKE 1 TABLET BY MOUTH EVERYDAY AT BEDTIME 90 tablet 3   mexiletine (MEXITIL) 150 MG capsule Take 2 capsules (300 mg total) by mouth 2 (two) times daily. 360 capsule 3   Multiple Vitamin (MULTIVITAMIN WITH MINERALS) TABS tablet Take 1 tablet by mouth daily.     ONETOUCH VERIO test strip 1 each 3 (three) times daily.  rOPINIRole (REQUIP) 0.25 MG tablet TAKE 2 TABLETS (0.5 MG TOTAL) BY MOUTH AT BEDTIME. 180 tablet 0   sacubitril-valsartan (ENTRESTO) 24-26 MG Take 1 tablet by mouth 2 (two) times daily. 180 tablet 3   No current facility-administered medications for this visit.    Review of Systems  Constitutional:  Constitutional negative. HENT: HENT negative.  Eyes: Eyes negative.  Respiratory: Respiratory negative.  Cardiovascular: Positive for leg swelling.  GI: Gastrointestinal negative.  Musculoskeletal: Positive for gait problem and leg pain.  Skin: Positive for wound.  Hematologic: Hematologic/lymphatic negative.  Psychiatric: Psychiatric negative.        Objective:  Objective   Vitals:   11/03/22 1202  BP: 132/77  Pulse: (!) 55  Resp: 14  Temp: 97.6 F (36.4 C)  TempSrc: Temporal  SpO2: 96%  Weight: 250 lb (113.4 kg)  Height: 5\' 6"  (1.676 m)   Body mass index is 40.35 kg/m.  Physical Exam HENT:     Head: Normocephalic.     Nose: Nose normal.  Eyes:     Pupils: Pupils are equal, round, and reactive to light.  Cardiovascular:     Pulses:          Femoral pulses are 0 on the right side and 0 on the left side.      Popliteal pulses are 0 on the right side and 0 on the left side.  Abdominal:     General: Abdomen is flat.     Palpations: Abdomen is soft.  Musculoskeletal:     Right lower leg: Edema present.     Left lower leg: Edema present.  Skin:    Capillary Refill: Capillary refill takes more than 3 seconds.     Comments: Small left anterior leg wound is superficial and discoloration  of left middle toe  Neurological:     Mental Status: She is alert.  Psychiatric:        Mood and Affect: Mood normal.        Thought Content: Thought content normal.        Judgment: Judgment normal.     Data: ABI Findings:  +---------+------------------+-----+----------+--------+  Right   Rt Pressure (mmHg)IndexWaveform  Comment   +---------+------------------+-----+----------+--------+  Brachial 122                                        +---------+------------------+-----+----------+--------+  PTA     144               1.12 triphasic           +---------+------------------+-----+----------+--------+  DP      114               0.88 monophasic          +---------+------------------+-----+----------+--------+  Great Toe64                0.50                     +---------+------------------+-----+----------+--------+   +---------+------------------+-----+----------+-------+  Left    Lt Pressure (mmHg)IndexWaveform  Comment  +---------+------------------+-----+----------+-------+  Brachial 129                                       +---------+------------------+-----+----------+-------+  PTA     62  0.48 biphasic           +---------+------------------+-----+----------+-------+  DP      60                0.47 monophasic         +---------+------------------+-----+----------+-------+  Great Toe46                0.36                    +---------+------------------+-----+----------+-------+   +-------+-----------+-----------+------------+------------+  ABI/TBIToday's ABIToday's TBIPrevious ABIPrevious TBI  +-------+-----------+-----------+------------+------------+  Right 1.12       0.50       1.12        0.68          +-------+-----------+-----------+------------+------------+  Left  0.48       0.36       0.68        0.40           +-------+-----------+-----------+------------+------------+       Right ABIs and TBIs appear essentially unchanged compared to prior study  on 11/30/2016. Left ABIs appear decreased compared to prior study on  11/30/2016.    Summary:  Right: Resting right ankle-brachial index is within normal range. The  right toe-brachial index is abnormal.   Left: Resting left ankle-brachial index indicates severe left lower  extremity arterial disease. The left toe-brachial index is abnormal.      Assessment/Plan:    82 year old female with severely depressed ABIs in the left lower extremity where she has a discolored third toe and superficial left leg wound.  She does not have readily palpable femoral pulses with a history of iliac artery repair after spinal surgery in the 90s by Dr. Olga Millers.  As such we will proceed with CT scan for evaluation and can discuss options for left lower extremity revascularization moving forward.  Patient states that due to her underlying congestive heart failure she would not be a candidate for major surgery so we will need to be endovascularly aggressive.  I did discuss that she has risk of limb loss due to the underlying arterial insufficiency.      Maeola Harman MD [facility]

## 2022-11-09 ENCOUNTER — Ambulatory Visit (HOSPITAL_COMMUNITY)
Admission: RE | Admit: 2022-11-09 | Discharge: 2022-11-09 | Disposition: A | Discharge status: Home / Self Care | Payer: Medicare Other | Source: Ambulatory Visit | Attending: Physician Assistant | Admitting: Physician Assistant

## 2022-11-09 ENCOUNTER — Encounter (HOSPITAL_COMMUNITY): Payer: Self-pay

## 2022-11-09 VITALS — BP 104/60 | HR 71 | Wt 251.0 lb

## 2022-11-09 DIAGNOSIS — E669 Obesity, unspecified: Secondary | ICD-10-CM | POA: Insufficient documentation

## 2022-11-09 DIAGNOSIS — I472 Ventricular tachycardia, unspecified: Secondary | ICD-10-CM | POA: Diagnosis not present

## 2022-11-09 DIAGNOSIS — J449 Chronic obstructive pulmonary disease, unspecified: Secondary | ICD-10-CM | POA: Diagnosis not present

## 2022-11-09 DIAGNOSIS — Z8249 Family history of ischemic heart disease and other diseases of the circulatory system: Secondary | ICD-10-CM | POA: Diagnosis not present

## 2022-11-09 DIAGNOSIS — I2510 Atherosclerotic heart disease of native coronary artery without angina pectoris: Secondary | ICD-10-CM | POA: Diagnosis not present

## 2022-11-09 DIAGNOSIS — E1122 Type 2 diabetes mellitus with diabetic chronic kidney disease: Secondary | ICD-10-CM | POA: Diagnosis not present

## 2022-11-09 DIAGNOSIS — G4733 Obstructive sleep apnea (adult) (pediatric): Secondary | ICD-10-CM | POA: Insufficient documentation

## 2022-11-09 DIAGNOSIS — Z833 Family history of diabetes mellitus: Secondary | ICD-10-CM | POA: Diagnosis not present

## 2022-11-09 DIAGNOSIS — Z95810 Presence of automatic (implantable) cardiac defibrillator: Secondary | ICD-10-CM | POA: Insufficient documentation

## 2022-11-09 DIAGNOSIS — I5042 Chronic combined systolic (congestive) and diastolic (congestive) heart failure: Secondary | ICD-10-CM | POA: Diagnosis present

## 2022-11-09 DIAGNOSIS — Z7984 Long term (current) use of oral hypoglycemic drugs: Secondary | ICD-10-CM | POA: Diagnosis not present

## 2022-11-09 DIAGNOSIS — I484 Atypical atrial flutter: Secondary | ICD-10-CM | POA: Diagnosis not present

## 2022-11-09 DIAGNOSIS — I480 Paroxysmal atrial fibrillation: Secondary | ICD-10-CM | POA: Diagnosis not present

## 2022-11-09 DIAGNOSIS — I4892 Unspecified atrial flutter: Secondary | ICD-10-CM | POA: Diagnosis not present

## 2022-11-09 DIAGNOSIS — I5032 Chronic diastolic (congestive) heart failure: Secondary | ICD-10-CM | POA: Diagnosis not present

## 2022-11-09 DIAGNOSIS — Z79899 Other long term (current) drug therapy: Secondary | ICD-10-CM | POA: Diagnosis not present

## 2022-11-09 DIAGNOSIS — E1151 Type 2 diabetes mellitus with diabetic peripheral angiopathy without gangrene: Secondary | ICD-10-CM | POA: Diagnosis not present

## 2022-11-09 DIAGNOSIS — Z6841 Body Mass Index (BMI) 40.0 and over, adult: Secondary | ICD-10-CM | POA: Diagnosis not present

## 2022-11-09 DIAGNOSIS — Z87891 Personal history of nicotine dependence: Secondary | ICD-10-CM | POA: Diagnosis not present

## 2022-11-09 DIAGNOSIS — Z7901 Long term (current) use of anticoagulants: Secondary | ICD-10-CM | POA: Insufficient documentation

## 2022-11-09 DIAGNOSIS — I739 Peripheral vascular disease, unspecified: Secondary | ICD-10-CM

## 2022-11-09 DIAGNOSIS — Z8616 Personal history of COVID-19: Secondary | ICD-10-CM | POA: Diagnosis not present

## 2022-11-09 DIAGNOSIS — I130 Hypertensive heart and chronic kidney disease with heart failure and stage 1 through stage 4 chronic kidney disease, or unspecified chronic kidney disease: Secondary | ICD-10-CM | POA: Insufficient documentation

## 2022-11-09 DIAGNOSIS — N1832 Chronic kidney disease, stage 3b: Secondary | ICD-10-CM

## 2022-11-09 LAB — CBC
HCT: 48.2 % — ABNORMAL HIGH (ref 36.0–46.0)
Hemoglobin: 15.9 g/dL — ABNORMAL HIGH (ref 12.0–15.0)
MCH: 30.9 pg (ref 26.0–34.0)
MCHC: 33 g/dL (ref 30.0–36.0)
MCV: 93.8 fL (ref 80.0–100.0)
Platelets: 165 10*3/uL (ref 150–400)
RBC: 5.14 MIL/uL — ABNORMAL HIGH (ref 3.87–5.11)
RDW: 14.6 % (ref 11.5–15.5)
WBC: 8 10*3/uL (ref 4.0–10.5)
nRBC: 0 % (ref 0.0–0.2)

## 2022-11-09 LAB — COMPREHENSIVE METABOLIC PANEL
ALT: 22 U/L (ref 0–44)
AST: 40 U/L (ref 15–41)
Albumin: 3.5 g/dL (ref 3.5–5.0)
Alkaline Phosphatase: 53 U/L (ref 38–126)
Anion gap: 14 (ref 5–15)
BUN: 20 mg/dL (ref 8–23)
CO2: 26 mmol/L (ref 22–32)
Calcium: 8.7 mg/dL — ABNORMAL LOW (ref 8.9–10.3)
Chloride: 96 mmol/L — ABNORMAL LOW (ref 98–111)
Creatinine, Ser: 1.54 mg/dL — ABNORMAL HIGH (ref 0.44–1.00)
GFR, Estimated: 34 mL/min — ABNORMAL LOW (ref >60)
Glucose, Bld: 103 mg/dL — ABNORMAL HIGH (ref 70–99)
Potassium: 4.2 mmol/L (ref 3.5–5.1)
Sodium: 136 mmol/L (ref 135–145)
Total Bilirubin: 0.6 mg/dL (ref 0.3–1.2)
Total Protein: 6.9 g/dL (ref 6.5–8.1)

## 2022-11-09 LAB — TSH: TSH: 0.135 u[IU]/mL — ABNORMAL LOW (ref 0.350–4.500)

## 2022-11-09 MED ORDER — METOPROLOL SUCCINATE ER 25 MG PO TB24: 12.5000 mg | ORAL_TABLET | Freq: Every day | ORAL | 3 refills | Status: DC

## 2022-11-09 NOTE — Patient Instructions (Addendum)
EKG done today.  Labs done today. We will contact you only if your labs are abnormal.  INCREASE Amiodarone to  (1 tablet) by mouth 2 times daily for 7 days THEN DECREASE back down to  (1 tablet) by mouth daily.   DECREASE Metoprolol to 12.5mg  (1/2 tablet) by mouth daily.   No other medication changes were made. Please continue all current medications as prescribed.  Your physician recommends that you schedule a follow-up appointment in: 4-6 weeks with Dr. Shirlee Latch  If you have any questions or concerns before your next appointment please send [name] a message through [facility] or call our office at [phone].    TO LEAVE A MESSAGE FOR THE NURSE SELECT OPTION 2, PLEASE LEAVE A MESSAGE INCLUDING: YOUR NAME DATE OF BIRTH CALL BACK NUMBER REASON FOR CALL**this is important as we prioritize the call backs  YOU WILL RECEIVE A CALL BACK THE SAME DAY AS LONG AS YOU CALL BEFORE 4:00 PM   Do the following things EVERYDAY: Weigh yourself in the morning before breakfast. Write it down and keep it in a log. Take your medicines as prescribed Eat low salt foods--Limit salt (sodium) to 2000 mg per day.  Stay as active as you can everyday Limit all fluids for the day to less than 2 liters   At the [facility], you and your health needs are our priority. As part of our continuing mission to provide you with exceptional heart care, we have created designated Provider Care Teams. These Care Teams include your primary Cardiologist (physician) and Advanced Practice Providers (APPs- Physician Assistants and Nurse Practitioners) who all work together to provide you with the care you need, when you need it.   You may see any of the following providers on your designated Care Team at your next follow up: Dr Arvilla Meres Dr Marca Ancona Dr. Marcos Eke, NP Robbie Lis, [place] [facility], [place] Brynda Peon, NP Karle Plumber, PharmD   Please  be sure to bring in all your medications bottles to every appointment.    Thank you for choosing [facility]     You are scheduled for a Cardioversion on Friday, May 3 with Dr. Marca Ancona.  Please arrive at the [facility] (Main Entrance A) at [facility]: [address] [place], [place] [address] at 12:00 PM.   DIET:  Nothing to eat or drink after midnight except a sip of water with medications (see medication instructions below)  MEDICATION INSTRUCTIONS: !!IF ANY NEW MEDICATIONS ARE STARTED AFTER TODAY, PLEASE NOTIFY YOUR PROVIDER AS SOON AS POSSIBLE!!   DO NOT TAKE LASIX THE DAY OF YOUR PROCEDURE.   FYI:  For your safety, and to allow [name] to monitor your vital signs accurately during the surgery/procedure we request: If you have artificial nails, gel coating, SNS etc, please have those removed prior to your surgery/procedure. Not having the nail coverings /polish removed may result in cancellation or delay of your surgery/procedure.  You must have a responsible person to drive you home and stay in the waiting area during your procedure. Failure to do so could result in cancellation.  Bring your insurance cards.  *Special Note: Every effort is made to have your procedure done on time. Occasionally there are emergencies that occur at the hospital that may cause delays. Please be patient if a delay does occur.

## 2022-11-09 NOTE — Progress Notes (Signed)
[facility] Note   PCP:  Ann Held, DO  HF Cardiologist: Dr. [name]  History of Present Illness: Heidi Stark is a 82 y.o. female who has history of HTN, DM, hyperlipidemia.  She was admitted in 4/15 with splenic and renal infarcts.  She was found to have paroxysmal atrial fibrillation.  In 4/15, she had fever and flank pain.  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  TEE showed prominent aortic plaque.  While she was in the hospital, she was noted to have a brief run of atrial fibrillation and was started on coumadin.     She has left leg pain with ambulation after about 100 feet, LE dopplers 5/15 showed occluded left external iliac artery. This has been present ever since she had left iliac damage with a prior back surgery.     She was stable until 1/18, when she developed palpitations and exertional dyspnea.  She was admitted to [facility] with atrial fibrillation/RVR and acute systolic CHF.  EF was found to be 30-35%, down from 50-55% in 2015.  She was diuresed and underwent DCCV back to NSR.  She subsequently had atrial fibrillation ablation in 4/18.  She went back into atrial fibrillation in 5/19 and had DCCV back today NSR.    Echo was done 6/19 and reviewed, EF 55%, mild LVH, moderate diastolic dysfunction, normal RV size and systolic function.    With recurrent atrial fibrillation, she was admitted in 10/19 to start Tikosyn.  She was cardioverted back to NSR.   She had a recent admission in 2/22 with lightheadedness/presyncope that appeared to correlate with runs of VT.  She was on Tikosyn for atrial fibrillation though VT not thought to be due to Tikosyn (not polymorphic, QT not prolonged).  Tikosyn was stopped and amiodarone was started.  Echo in 2/22 showed EF 50-55% but cMRI (2/22) showed EF 40% with subendocardial scar in the basal to mid inferolateral wall, RV EF was 38%.  She had coronary angiography showing  no obstructive disease.  VT subsided and she was sent home on amiodarone.    09/13/20, she had 3 episodes of presyncope in succession while sitting at a desk.  No prodrome, each episode of severe lightheadedness lasted maybe 30 seconds.  She did not pass out. Because of these events, she came to the ER. She was admitted for observation and amiodarone gtt was restarted.  She had no VT overnight.  She remained in atypical atrial flutter. (was in atypical flutter at last admission). She had a single chamber Medtronic ICD placed 09/15/20. Discharge weight 256 lbs.   She had post hospital f/u on 09/22/20 and was noted to be back in atrial flutter w/ CVR. She was set up for repeat DCCV 3 weeks later. S/p DCCV on 10/11/20 and converted back to NSR.   She had COVID-19 in 9/22.   Follow up 09/01/21, she was volume overloaded and instructed to take metolazone 2.5 + extra 40 KCL x 2 days.  Multiple episodes of VT on 09/06/21, received ATP but no shocks. Follow up with EP 09/08/21 and started on mexiletine 250 bid.  On 11/28/21, she had VT with ATP and 1 ICD shock.  She had not taken amiodarone or mexiletine for a couple of days when this occurred.  She saw Dr. [name], meds were not changed.   Echo in 8/23 showed EF 60-65%, moderate LVH, normal RV, normal IVC.  Follow up 11/23, NYHA II-early III. Volume stable. No VT on device interrogation.  Follow up 3/24: No VT/AF on device interrogation, OptiVol trending up. Increased lasix x3 days. Entresto decreased with symptomatic hypotension.  Today she returns for follow up with her daughter. Overall feeling fatigued. For the past 2 weeks has felt tired and has no energy to do anything (correlates with device interrogation, in a fib >6hrs/day for ~ 2 weeks). Denies palpitations, CP, dizziness, edema, or PND/Orthopnea. Wears her CPAP nightly. Still naps several times during the day. Denies SOB. Appetite fluctuates. No fever or chills. Taking all medications.  Able to walk  around her home with her walker.  MDT device interrogation: ~6hrs/day in AF for 19 days, Optivol stable, 0 hr activity. VS 99.7% VP 0.3%  ECG (personally reviewed): a fib / flutter w/ PVCs 57 bpm (Personally reviewed)    Labs (5/19): K 3.9, creatinine 0.78 Labs (6/19): K 4.4, creatinine 0.88 Labs (12/19): K 4.2, creatinine 0.85, TGs 322, LDL 77 Labs (2/20): LDL 46, TGs 362 Labs (3/20): K 4.7, creatinine 1.07 Labs (11/20): TGs 282, LDL 92 Labs (3/22): K 4.1, creatinine 1.23 Labs (4/22): K 4.2, creatinine 1.2, LFTs normal, TSH mildly elevated, free T4 mildly elevated, normal T3.  Labs (6/22): TSH mildly elevated, free T3 and T4 normal Labs (7/22): TGs 292, LDL 94 Labs (10/22): K 3.8, creatinine 1.04, BNP 62, hgb 14.1, LFTs normal Labs (2/23): K 3.5, creatinine 1.75, mag 2.5 Labs (3/23): TSH 8.31 Labs (5/23): BNP 542, K 3.8, creatinine 1.7 Labs (8/23): hgb 14.2, K 4, creatinine 1.66 Labs (11/23): normal TSH Labs (12/23): K 3.6, creatinine 1.35, normal LFTs Labs (3/24): K 4, SCr 1.51  PMH: 1.Type II diabetes 2. HTN 3. Hyperlipidemia 4. COPD: has quit smoking.  5. CAD: Coronary CTA (4/18) with nonobstructive coronary disease, calcium score 335 Agatston units (82nd percentile).  - Coronary angiography (2/22) with no obstructive disease 6. Paroxysmal atrial fibrillation: First noted in 4/15.  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   - Event monitor (4/15) with no atrial fibrillation.  - Admission with atrial fibrillation/RVR in 1/18.  - Atrial fibrillation ablation in 4/18.  - DCCV in 5/19.  - Tikosyn started + DCCV in 10/19. VT => Tikosyn stopped and amiodarone begun in 3/22.   - S/p single chamber MDT ICD 09/15/20 - DCCV to NSR in 3/22.  7. Chronic systolic CHF: ?tachycardia-mediated as first noted in setting of atrial fibrillation with RVR.  - TEE (4/15) with EF 50-55%, mild LVH, grade III-IV plaque in descending thoracic aorta, RV normal,  no PFO, peak RV-RA gradient 42 mmHg.   - Echo (1/18) with EF 30-35%, diffuse hypokinesis, moderate LVH, mildly dilated RV, PASP 55 mmHg.  - Echo (4/18) with EF 50%, anterolateral/inferolateral mild hypokinesis, mild aortic stenosis, IVC normal.  - Echo (6/19) with EF 55%, mild LVH, moderate diastolic dysfunction, normal RV size and systolic function.  - cMRI (2/22) with EF 40% with subendocardial scar in the basal to mid inferolateral wall, RV EF was 38%. - Echo (2/22) with EF 50-55%, normal RV.  - Echo (8/23): EF 60-65%, moderate LVH, normal RV, normal IVC.  7. H/o diskectomy. 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 Lower extremity arterial dopplers (5/15) with occluded left external iliac artery.  - ABIs (5/18): 1.16 (normal) on right, 0.56 on left.  9. Aortic stenosis: Mild on 4/18 echo.  10. OSA: Uses CPAP 11. VT: 2/22.  No significant  CAD on cath in 2/22.  cMRI with EF 40% with subendocardial scar in the basal to mid inferolateral wall, RV EF was 38%. - Medtronic ICD placed 3/22.  12. COVID-19 9/22  Current Outpatient Medications  Medication Sig Dispense Refill   acetaminophen (TYLENOL) 500 MG tablet Take 500 mg by mouth every 6 (six) hours as needed for moderate pain or headache.     amiodarone (PACERONE) 200 MG tablet Take 200 mg by mouth daily.     apixaban (ELIQUIS) 5 MG TABS tablet Take 1 tablet (5 mg total) by mouth 2 (two) times daily. 60 tablet 11   BD PEN NEEDLE NANO 2ND GEN 32G X 4 MM MISC See admin instructions.     dapagliflozin propanediol (FARXIGA) 10 MG TABS tablet TAKE 1 TABLET BY MOUTH EVERY DAY BEFORE BREAKFAST 90 tablet 3   eplerenone (INSPRA) 25 MG tablet TAKE 1 TABLET BY MOUTH EVERY DAY 90 tablet 3   fenofibrate (TRICOR) 145 MG tablet TAKE 1 TABLET BY MOUTH EVERY DAY 90 tablet 3   furosemide (LASIX) 40 MG tablet Patient takes 2 tablets by mouth in the morning and 2 tablets in the evening.     gabapentin (NEURONTIN) 300 MG capsule Take 300 mg by  mouth at bedtime.     HUMALOG MIX 75/25 KWIKPEN (75-25) 100 UNIT/ML KwikPen Take 40 units with morning meal and 30 units with evening meal 90 mL 2   icosapent Ethyl (VASCEPA) 1 g capsule TAKE 2 CAPSULES BY MOUTH TWICE A DAY 120 capsule 5   KLOR-CON M10 10 MEQ tablet TAKE 2 TABLETS BY MOUTH TWICE A DAY 360 tablet 3   levothyroxine (SYNTHROID) 50 MCG tablet Taking 2  tablets = Mondays, Wednesdays and Fridays. 1 tablet all other days.     LIVALO 4 MG TABS TAKE 1 TABLET BY MOUTH EVERY DAY 90 tablet 3   mexiletine (MEXITIL) 150 MG capsule Take 2 capsules (300 mg total) by mouth 2 (two) times daily. 360 capsule 3   Multiple Vitamin (MULTIVITAMIN WITH MINERALS) TABS tablet Take 1 tablet by mouth daily.     ONETOUCH VERIO test strip 1 each 3 (three) times daily.     rOPINIRole (REQUIP) 0.25 MG tablet TAKE 2 TABLETS (0.5 MG TOTAL) BY MOUTH AT BEDTIME. (Patient taking differently: Take 0.5 mg by mouth at bedtime. As needed) 180 tablet 0   sacubitril-valsartan (ENTRESTO) 24-26 MG Take 1 tablet by mouth 2 (two) times daily. (Patient taking differently: Patient takes 0.5 tablet tablet by mouth twice a day.) 180 tablet 3   metoprolol succinate (TOPROL-XL) 25 MG 24 hr tablet Take 0.5 tablets (12.5 mg total) by mouth daily. 45 tablet 3   No current facility-administered medications for this encounter.    Allergies:   Neomycin-bacitracin zn-polymyx, Sulfonamide derivatives, Sulfa antibiotics, Ciprofloxacin, and Spironolactone   Social History:  The patient  reports that she quit smoking about 2 years ago. Her smoking use included cigarettes. She has a 50.00 pack-year smoking history. She has never used smokeless tobacco. She reports that she does not drink alcohol and does not use drugs.   Family History:  The patient's family history includes Cancer in her mother; Diabetes in her father, mother, and another family member; Factor V Leiden deficiency in her daughter; Hypertension in her mother; Melanoma in  an other family member; Obesity in her father and mother.   ROS:  Please see the history of present illness.   All other systems are personally reviewed and negative.  BP 104/60   Pulse 71   Wt 113.9 kg (251 lb) Comment: Patient said she weighed herslf this morning at home.  SpO2 95%   BMI 40.51 kg/m   Wt Readings from Last 3 Encounters:  11/09/22 113.9 kg (251 lb)  11/03/22 113.4 kg (250 lb)  09/25/22 118.4 kg (261 lb)   PHYSICAL EXAM: General:  elderly appearing. Arrived in [facility] HEENT: normal Neck: supple. JVD flat. Carotids 2+ bilat; no bruits. No lymphadenopathy or thyromegaly appreciated. Cor: PMI nondisplaced. Brady rate & irregular rhythm. No rubs, gallops or murmurs. Lungs: clear Abdomen: soft, nontender, nondistended. No hepatosplenomegaly. No bruits or masses. Good bowel sounds. Extremities: no cyanosis, clubbing, rash, edema  Neuro: alert & oriented x 3, cranial nerves grossly intact. moves all 4 extremities w/o difficulty. Affect pleasant.   Recent Labs: 12/06/2021: Magnesium 2.3; NT-Pro BNP 542 05/26/2022: Hemoglobin 15.0; Platelets 191; TSH 3.225 06/27/2022: ALT 21 09/25/2022: B Natriuretic Peptide 91.8; BUN 31; Creatinine, Ser 1.51; Potassium 4.0; Sodium 135   EKG: a fib/ flutter with slow response and PVCs, RBBB 57 bpm (Personally reviewed)    MDT device interrogation: ~6hrs/day in AF for 19 days, Optivol stable, 0 hr activity. VS 99.7% VP 0.3%  ASSESSMENT AND PLAN: 1. Atrial fibrillation/flutter: Paroxysmal.  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 She was admitted in 1/18 with atrial fibrillation/RVR and CHF.  She was diuresed and had DCCV back to NSR. She had atrial fibrillation ablation in 4/18.  In 5/19, she went into atrial fibrillation transiently.  In 10/19, she was back in atrial fibrillation and was admitted for Tikosyn initiation and cardioversion.  Atypical atrial flutter noted in 12/21 with DCCV to NSR. As outlined above,  now off Tikosyn and on amiodarone. Underwent repeat DCCV in 3/22.  - Fatigue correlates with last two weeks of a fib on device interrogation.  - Increase amiodarone 100 daily > 200 mg BID for 7 days followed by 200 mg daily - No missed doses of eliquis, will schedule DCCV in 1 week.  - Continue Eliquis 5 mg bid. No bleeding issues. - check CMET and TSH  2. Chronic systolic => diastolic CHF: EF 16-10% on echo in 1/18, down from 50-55% in 4/15.  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 In 2/22, cMRI showed LV EF 40% with RV E 38%, inferolateral subendocardial LGE, but echo in 2/22 showed EF 50-55%.  Echo 8/23 showed EF 60-65%, moderate LVH, normal RV, normal IVC.  Stable NYHA II-early III, functional status limited by body habitus and chronic back pain. She is not volume overloaded by exam, OptiVol stable - Continue Entresto 24/26 mg bid> previously had symptomatic hypotension at higher dose.  BMET today - Continue Lasix 80 mg bid + 20 KCL bid. Consider switch to torsemide next if she has issues with volume overload. - Decrease Toprol XL 25>12.5 mg daily.   - Continue eplerenone 25 mg daily.  - Continue dapagliflozin 10 mg daily.   3. H/o VT: noted 08/2020. 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 Had been on Tikosyn but this was not the typical Tikosyn-induced polymorphic VT and she had had no changes in Tikosyn dosing prior to occurrence. cMRI showed subendocardial LGE in the basal to mid inferolateral wall. This appeared to be a coronary disease pattern however coronary angiogram did not show significant coronary disease.  She was transitioned from Tikosyn to amiodarone and underwent Medtronic single chamber ICD  on 09/15/20. VT on 09/06/21 and was started on mexiletine.  VT again on 11/28/21 after missing amiodarone and mexiletine. No VT since then on device interrogation.  - Amiodarone changes as above (recently reduced with  mildly elevated LFTs).  LFTs 12/23 stable. Check TSH and CMET.  She will need regular eye exam.  - Continue mexiletine 300 mg bid.  4. Claudication: Left leg claudication with absent left PT pulse.  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   - ABI 4/24 Resting right ankle-brachial index is within normal range. The right toe-brachial index is abnormal. 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 The left toe-brachial index is abnormal.  - follows [facility], plan for lower ext CT  5. CAD: Coronary CTA in 2018 showed coronary calcium score in the 82nd percentile with nonobstructive coronary disease. Cath in 2/22 showed only luminal irregularities. She denies anginal symptoms.  - Not on ASA due to Eliquis.  - Continue Vascepa and Livalo.   6. COPD:  History of smoking. Quit 2/22.   7. OSA: Continue CPAP. Compliant.   8. Obesity: Body mass index is 40.51 kg/m. - Unable to tolerate semaglutide due to nausea.  9. CKD: Stage 3. BMET today.  Increase amiodarone to 200 BID x7 days then 200 mg daily. Has not missed any eliquis doses, will schedule DCCV in 1 week.   Follow up in 4-6 weeks with Dr. Shirlee Latch  Signed, Alen Bleacher, NP  11/09/2022  [facility] [address] [facility] [place] [address] [phone] (office) [phone] (fax)

## 2022-11-10 ENCOUNTER — Other Ambulatory Visit (HOSPITAL_COMMUNITY): Payer: Self-pay

## 2022-11-10 ENCOUNTER — Telehealth (HOSPITAL_COMMUNITY): Payer: Self-pay

## 2022-11-10 DIAGNOSIS — E039 Hypothyroidism, unspecified: Secondary | ICD-10-CM

## 2022-11-10 DIAGNOSIS — I4892 Unspecified atrial flutter: Secondary | ICD-10-CM

## 2022-11-10 NOTE — Telephone Encounter (Signed)
-----   Message from Alen Bleacher, NP sent at 11/10/2022 11:03 AM EDT ----- TSH low, please call patient to come in for labs. Needs TSH, free T3 and T4. Next week is fine.

## 2022-11-10 NOTE — Telephone Encounter (Signed)
Patient aware of labs. New labs ordered and scheduled.

## 2022-11-13 ENCOUNTER — Ambulatory Visit (HOSPITAL_COMMUNITY)
Admission: RE | Admit: 2022-11-13 | Discharge: 2022-11-13 | Disposition: A | Discharge status: Home / Self Care | Payer: Medicare Other | Source: Ambulatory Visit | Attending: Cardiology | Admitting: Cardiology

## 2022-11-13 DIAGNOSIS — E039 Hypothyroidism, unspecified: Secondary | ICD-10-CM | POA: Diagnosis present

## 2022-11-13 LAB — T4, FREE: Free T4: 2.54 ng/dL — ABNORMAL HIGH (ref 0.61–1.12)

## 2022-11-13 LAB — TSH: TSH: 0.086 u[IU]/mL — ABNORMAL LOW (ref 0.350–4.500)

## 2022-11-14 ENCOUNTER — Telehealth (HOSPITAL_COMMUNITY): Payer: Self-pay | Admitting: Cardiology

## 2022-11-14 DIAGNOSIS — E039 Hypothyroidism, unspecified: Secondary | ICD-10-CM

## 2022-11-14 LAB — T3, FREE: T3, Free: 3.2 pg/mL (ref 2.0–4.4)

## 2022-11-14 NOTE — Telephone Encounter (Signed)
Patient called.  Patient aware.  

## 2022-11-14 NOTE — Addendum Note (Signed)
Addended by: Terriona Horlacher, Milagros Reap on: 11/14/2022 10:30 AM   Modules accepted: Orders

## 2022-11-14 NOTE — Telephone Encounter (Signed)
-----   Message from Alen Bleacher, NP sent at 11/13/2022  3:48 PM EDT ----- Elevated Free T4, low TSH.   Stop synthroid. Repeat TSH, free T4, T3 in 4 weeks. Refer to endocrinology.

## 2022-11-16 NOTE — Pre-Procedure Instructions (Signed)
Spoke to patient over the phone regarding instructions for cardioversion tomorrow - arrive at 1200, NPO after midnight, take AM pills with a sip of water, confirmed patient has ride home and responsible person to stay with them for 24 hours after the procedure, confirmed patient hasn't missed any doses of eliquis

## 2022-11-17 ENCOUNTER — Ambulatory Visit (HOSPITAL_COMMUNITY)
Admission: RE | Admit: 2022-11-17 | Discharge: 2022-11-17 | Disposition: A | Discharge status: Home / Self Care | Payer: Medicare Other | Attending: Cardiology | Admitting: Cardiology

## 2022-11-17 ENCOUNTER — Ambulatory Visit (HOSPITAL_COMMUNITY): Payer: Medicare Other | Admitting: Anesthesiology

## 2022-11-17 ENCOUNTER — Encounter (HOSPITAL_COMMUNITY)
Admission: RE | Disposition: A | Discharge status: Home / Self Care | Payer: Self-pay | Source: Home / Self Care | Attending: Cardiology

## 2022-11-17 DIAGNOSIS — Z539 Procedure and treatment not carried out, unspecified reason: Secondary | ICD-10-CM | POA: Insufficient documentation

## 2022-11-17 SURGERY — INVASIVE LAB ABORTED CASE
Anesthesia: General

## 2022-11-17 SURGICAL SUPPLY — 1 items: ELECT DEFIB PAD ADLT CADENCE (PAD) ×1 IMPLANT

## 2022-11-17 NOTE — Progress Notes (Signed)
Confirmed with Dr. Shirlee Latch that pt is in NSR, no need for cardioversion. Pt informed to continue amiodarone 200mg  daily per MD order and to follow up at next cardiology appointment. Pt acknowledges understanding. Pt given all belongings back and wheeled out to daughters care.

## 2022-11-17 NOTE — Anesthesia Preprocedure Evaluation (Deleted)
Anesthesia Evaluation  Patient identified by MRN, date of birth, ID band Patient awake    Reviewed: Allergy & Precautions, H&P , NPO status , Patient's Chart, lab work & pertinent test results  Airway Mallampati: II  TM Distance: >3 FB Neck ROM: Full    Dental no notable dental hx.    Pulmonary sleep apnea and Continuous Positive Airway Pressure Ventilation , COPD, former smoker   Pulmonary exam normal breath sounds clear to auscultation       Cardiovascular hypertension, + Peripheral Vascular Disease  Normal cardiovascular exam+ dysrhythmias Atrial Fibrillation + Cardiac Defibrillator  Rhythm:Irregular Rate:Normal  Left Ventricle: Left ventricular ejection fraction, by estimation, is 60  to 65%. The left ventricle has normal function. The left ventricle has no  regional wall motion abnormalities. The left ventricular internal cavity  size was normal in size. There is   moderate left ventricular hypertrophy. Left ventricular diastolic  parameters are consistent with Grade II diastolic dysfunction  (pseudonormalization). Elevated left atrial pressure.   Right Ventricle: The right ventricular size is normal. No increase in  right ventricular wall thickness. Right ventricular systolic function is  normal. There is normal pulmonary artery systolic pressure. The tricuspid  regurgitant velocity is 2.74 m/s, and   with an assumed right atrial pressure of 3 mmHg, the estimated right  ventricular systolic pressure is 33.0 mmHg.   Left Atrium: Left atrial size was moderately dilated.   Right Atrium: Right atrial size was mildly dilated.   Pericardium: There is no evidence of pericardial effusion.   Mitral Valve: The mitral valve is degenerative in appearance. Severe  mitral annular calcification. No evidence of mitral valve regurgitation.  No evidence of mitral valve stenosis.   Tricuspid Valve: The tricuspid valve is normal in  structure. Tricuspid  valve regurgitation is mild.   Aortic Valve: The aortic valve is tricuspid. Aortic valve regurgitation is  not visualized. Aortic valve sclerosis is present, with no evidence of  aortic valve stenosis. Aortic valve peak gradient measures 14.6 mmHg.   Pulmonic Valve: The pulmonic valve was not well visualized. Pulmonic valve  regurgitation is trivial.   Aorta: The aortic root is normal in size and structure and aortic  dilatation noted. There is mild dilatation of the ascending aorta,  measuring 38 mm.      Neuro/Psych negative neurological ROS  negative psych ROS   GI/Hepatic negative GI ROS, Neg liver ROS,,,  Endo/Other  diabetes  Morbid obesity  Renal/GU negative Renal ROS  negative genitourinary   Musculoskeletal negative musculoskeletal ROS (+)    Abdominal   Peds negative pediatric ROS (+)  Hematology negative hematology ROS (+)   Anesthesia Other Findings   Reproductive/Obstetrics negative OB ROS                             Anesthesia Physical Anesthesia Plan  ASA: 4  Anesthesia Plan: General   Post-op Pain Management: Minimal or no pain anticipated   Induction: Intravenous  PONV Risk Score and Plan: 3 and Treatment may vary due to age or medical condition  Airway Management Planned: Mask  Additional Equipment:   Intra-op Plan:   Post-operative Plan:   Informed Consent: I have reviewed the patients History and Physical, chart, labs and discussed the procedure including the risks, benefits and alternatives for the proposed anesthesia with the patient or authorized representative who has indicated his/her understanding and acceptance.     Dental advisory given  Plan Discussed with: CRNA and Surgeon  Anesthesia Plan Comments:        Anesthesia Quick Evaluation

## 2022-11-17 NOTE — Progress Notes (Signed)
Pt appeared to be in NSR on monitor, EKG performed and pacemaker interrogated. MD paged.

## 2022-11-21 ENCOUNTER — Other Ambulatory Visit: Payer: Self-pay

## 2022-11-21 DIAGNOSIS — I7025 Atherosclerosis of native arteries of other extremities with ulceration: Secondary | ICD-10-CM

## 2022-11-28 ENCOUNTER — Encounter: Payer: Self-pay | Admitting: Cardiology

## 2022-11-29 ENCOUNTER — Ambulatory Visit: Payer: Medicare Other | Admitting: Vascular Surgery

## 2022-12-02 ENCOUNTER — Ambulatory Visit (HOSPITAL_BASED_OUTPATIENT_CLINIC_OR_DEPARTMENT_OTHER)
Admission: RE | Admit: 2022-12-02 | Discharge: 2022-12-02 | Disposition: A | Discharge status: Home / Self Care | Payer: Medicare Other | Source: Ambulatory Visit | Attending: Vascular Surgery | Admitting: Vascular Surgery

## 2022-12-02 DIAGNOSIS — I7025 Atherosclerosis of native arteries of other extremities with ulceration: Secondary | ICD-10-CM | POA: Insufficient documentation

## 2022-12-02 IMAGING — DX DG CHEST 1V PORT
1 series · 1 of 1 positions shown · non-contrast
Comparison: 08/09/2016.

CLINICAL DATA: Near syncope.

EXAM:
PORTABLE CHEST 1 VIEW

[chest]
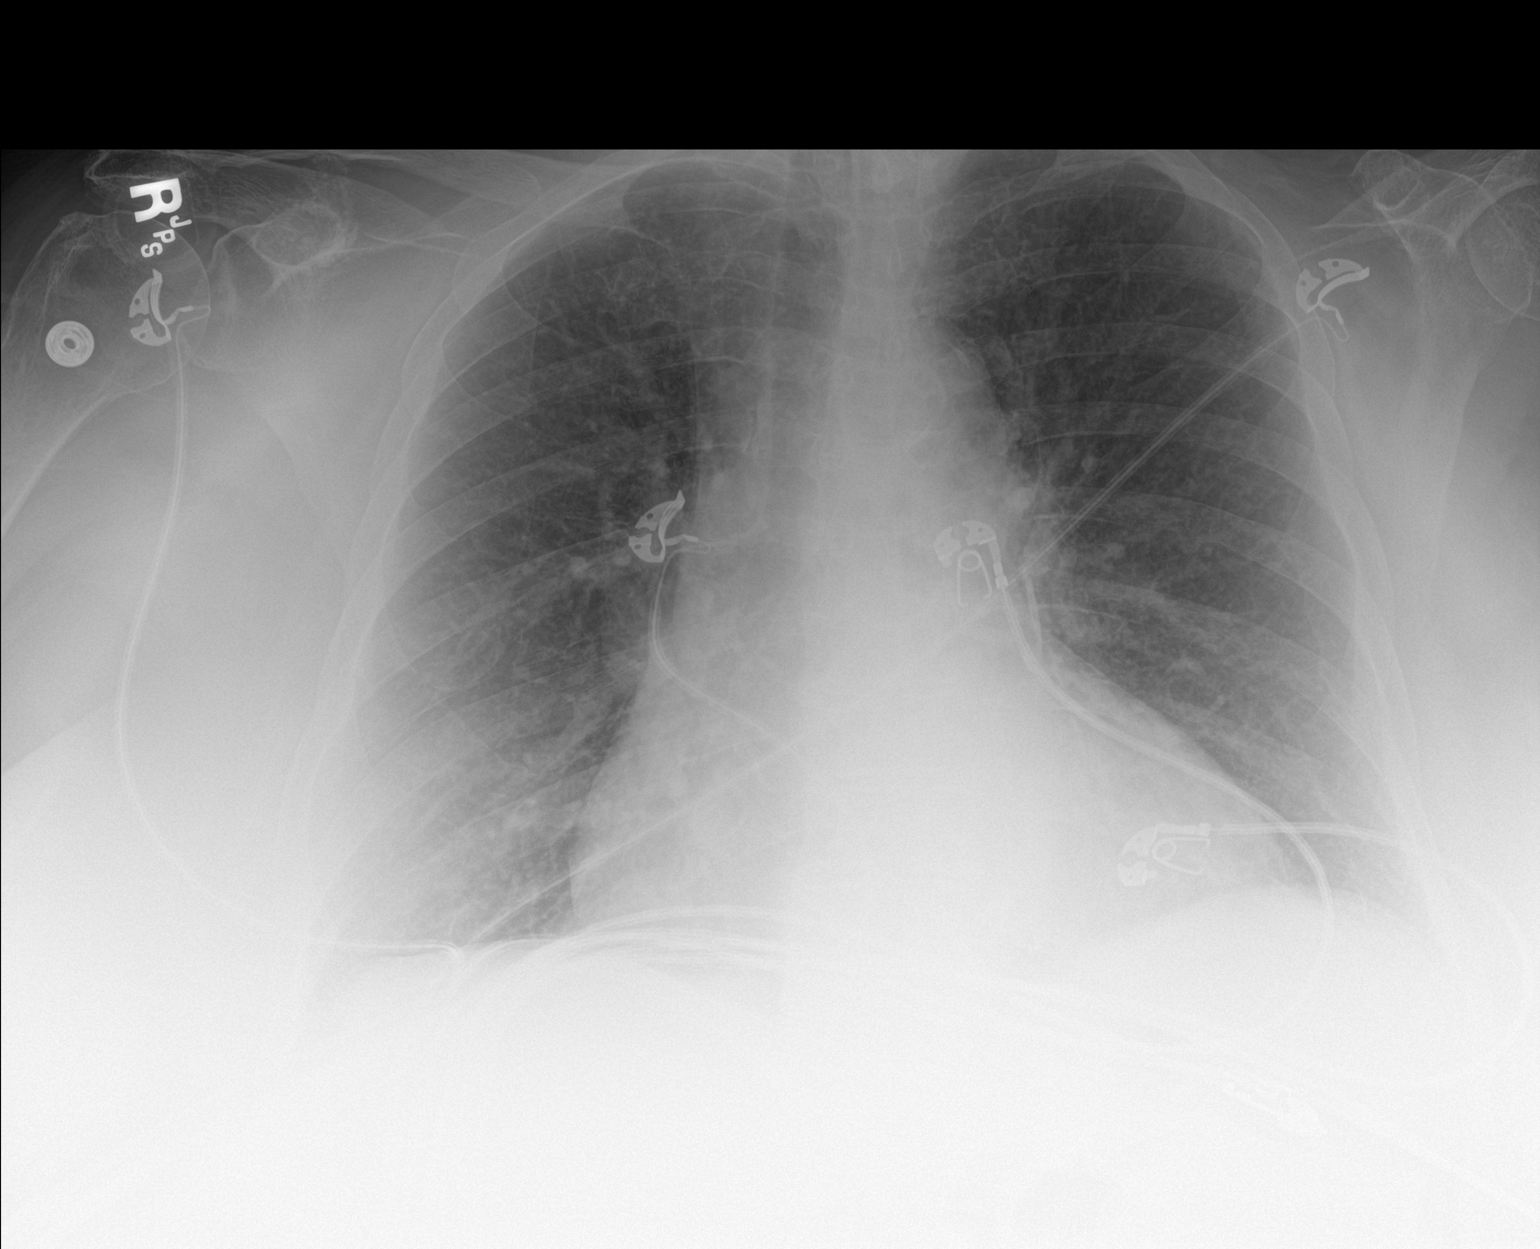

[1 of 1 positions shown; findings below may reference images not displayed]

FINDINGS: Cardiomegaly. Low lung volumes. No focal infiltrate. No pleural
effusion or pneumothorax.
IMPRESSION: Cardiomegaly. No acute pulmonary disease.

## 2022-12-02 MED ORDER — IOHEXOL 350 MG/ML SOLN
100.0000 mL | Freq: Once | INTRAVENOUS | Status: AC | PRN
  Administered 2022-12-02: 100 mL via INTRAVENOUS

## 2022-12-06 ENCOUNTER — Ambulatory Visit (HOSPITAL_COMMUNITY)
Admission: RE | Admit: 2022-12-06 | Discharge: 2022-12-06 | Disposition: A | Discharge status: Home / Self Care | Payer: Medicare Other | Source: Ambulatory Visit | Attending: Cardiology | Admitting: Cardiology

## 2022-12-06 ENCOUNTER — Encounter (HOSPITAL_COMMUNITY): Payer: Self-pay | Admitting: Cardiology

## 2022-12-06 VITALS — BP 90/50 | HR 60 | Wt 258.0 lb

## 2022-12-06 DIAGNOSIS — I5022 Chronic systolic (congestive) heart failure: Secondary | ICD-10-CM | POA: Diagnosis not present

## 2022-12-06 DIAGNOSIS — I5042 Chronic combined systolic (congestive) and diastolic (congestive) heart failure: Secondary | ICD-10-CM | POA: Insufficient documentation

## 2022-12-06 DIAGNOSIS — E1151 Type 2 diabetes mellitus with diabetic peripheral angiopathy without gangrene: Secondary | ICD-10-CM | POA: Diagnosis not present

## 2022-12-06 DIAGNOSIS — E0590 Thyrotoxicosis, unspecified without thyrotoxic crisis or storm: Secondary | ICD-10-CM | POA: Diagnosis not present

## 2022-12-06 DIAGNOSIS — I130 Hypertensive heart and chronic kidney disease with heart failure and stage 1 through stage 4 chronic kidney disease, or unspecified chronic kidney disease: Secondary | ICD-10-CM | POA: Diagnosis not present

## 2022-12-06 DIAGNOSIS — E1122 Type 2 diabetes mellitus with diabetic chronic kidney disease: Secondary | ICD-10-CM | POA: Diagnosis not present

## 2022-12-06 DIAGNOSIS — G4733 Obstructive sleep apnea (adult) (pediatric): Secondary | ICD-10-CM | POA: Insufficient documentation

## 2022-12-06 DIAGNOSIS — G8929 Other chronic pain: Secondary | ICD-10-CM | POA: Diagnosis not present

## 2022-12-06 DIAGNOSIS — I2510 Atherosclerotic heart disease of native coronary artery without angina pectoris: Secondary | ICD-10-CM | POA: Insufficient documentation

## 2022-12-06 DIAGNOSIS — I472 Ventricular tachycardia, unspecified: Secondary | ICD-10-CM | POA: Diagnosis not present

## 2022-12-06 DIAGNOSIS — Z6841 Body Mass Index (BMI) 40.0 and over, adult: Secondary | ICD-10-CM | POA: Insufficient documentation

## 2022-12-06 DIAGNOSIS — Z79890 Hormone replacement therapy (postmenopausal): Secondary | ICD-10-CM | POA: Insufficient documentation

## 2022-12-06 DIAGNOSIS — M549 Dorsalgia, unspecified: Secondary | ICD-10-CM | POA: Insufficient documentation

## 2022-12-06 DIAGNOSIS — J449 Chronic obstructive pulmonary disease, unspecified: Secondary | ICD-10-CM | POA: Insufficient documentation

## 2022-12-06 DIAGNOSIS — Z7901 Long term (current) use of anticoagulants: Secondary | ICD-10-CM | POA: Diagnosis not present

## 2022-12-06 DIAGNOSIS — E669 Obesity, unspecified: Secondary | ICD-10-CM | POA: Diagnosis not present

## 2022-12-06 DIAGNOSIS — R110 Nausea: Secondary | ICD-10-CM | POA: Insufficient documentation

## 2022-12-06 DIAGNOSIS — I480 Paroxysmal atrial fibrillation: Secondary | ICD-10-CM | POA: Diagnosis not present

## 2022-12-06 DIAGNOSIS — Z87891 Personal history of nicotine dependence: Secondary | ICD-10-CM | POA: Insufficient documentation

## 2022-12-06 DIAGNOSIS — E785 Hyperlipidemia, unspecified: Secondary | ICD-10-CM | POA: Insufficient documentation

## 2022-12-06 DIAGNOSIS — E11621 Type 2 diabetes mellitus with foot ulcer: Secondary | ICD-10-CM | POA: Insufficient documentation

## 2022-12-06 DIAGNOSIS — I484 Atypical atrial flutter: Secondary | ICD-10-CM | POA: Diagnosis not present

## 2022-12-06 DIAGNOSIS — Z79899 Other long term (current) drug therapy: Secondary | ICD-10-CM | POA: Diagnosis not present

## 2022-12-06 DIAGNOSIS — E039 Hypothyroidism, unspecified: Secondary | ICD-10-CM | POA: Diagnosis not present

## 2022-12-06 DIAGNOSIS — R109 Unspecified abdominal pain: Secondary | ICD-10-CM | POA: Insufficient documentation

## 2022-12-06 LAB — COMPREHENSIVE METABOLIC PANEL
ALT: 18 U/L (ref 0–44)
AST: 31 U/L (ref 15–41)
Albumin: 3.5 g/dL (ref 3.5–5.0)
Alkaline Phosphatase: 48 U/L (ref 38–126)
Anion gap: 16 — ABNORMAL HIGH (ref 5–15)
BUN: 21 mg/dL (ref 8–23)
CO2: 24 mmol/L (ref 22–32)
Calcium: 8.6 mg/dL — ABNORMAL LOW (ref 8.9–10.3)
Chloride: 96 mmol/L — ABNORMAL LOW (ref 98–111)
Creatinine, Ser: 1.49 mg/dL — ABNORMAL HIGH (ref 0.44–1.00)
GFR, Estimated: 35 mL/min — ABNORMAL LOW (ref >60)
Glucose, Bld: 131 mg/dL — ABNORMAL HIGH (ref 70–99)
Potassium: 4 mmol/L (ref 3.5–5.1)
Sodium: 136 mmol/L (ref 135–145)
Total Bilirubin: 0.8 mg/dL (ref 0.3–1.2)
Total Protein: 6.8 g/dL (ref 6.5–8.1)

## 2022-12-06 LAB — LIPID PANEL
Cholesterol: 174 mg/dL (ref 0–200)
HDL: 34 mg/dL — ABNORMAL LOW (ref >40)
LDL Cholesterol: 71 mg/dL (ref 0–99)
Total CHOL/HDL Ratio: 5.1 RATIO
Triglycerides: 346 mg/dL — ABNORMAL HIGH (ref <150)
VLDL: 69 mg/dL — ABNORMAL HIGH (ref 0–40)

## 2022-12-06 LAB — CBC
HCT: 47.5 % — ABNORMAL HIGH (ref 36.0–46.0)
Hemoglobin: 15.1 g/dL — ABNORMAL HIGH (ref 12.0–15.0)
MCH: 30.3 pg (ref 26.0–34.0)
MCHC: 31.8 g/dL (ref 30.0–36.0)
MCV: 95.2 fL (ref 80.0–100.0)
Platelets: 159 10*3/uL (ref 150–400)
RBC: 4.99 MIL/uL (ref 3.87–5.11)
RDW: 14.4 % (ref 11.5–15.5)
WBC: 8.9 10*3/uL (ref 4.0–10.5)
nRBC: 0 % (ref 0.0–0.2)

## 2022-12-06 LAB — T4, FREE: Free T4: 1.82 ng/dL — ABNORMAL HIGH (ref 0.61–1.12)

## 2022-12-06 LAB — TSH: TSH: 0.865 u[IU]/mL (ref 0.350–4.500)

## 2022-12-06 NOTE — Progress Notes (Signed)
Advanced [facility] Failure Clinic Note   PCP:  Donato Schultz, DO  HF Cardiologist: Dr. Shirlee Latch  History of Present Illness: Heidi Stark is a 82 y.o. female who has history of HTN, DM, hyperlipidemia.  She was admitted in 4/15 with splenic and renal infarcts.  She was found to have paroxysmal atrial fibrillation.  In 4/15, she had fever and flank pain.  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  TEE showed prominent aortic plaque.  While she was in the hospital, she was noted to have a brief run of atrial fibrillation and was started on coumadin.     She has left leg pain with ambulation after about 100 feet, LE dopplers 5/15 showed occluded left external iliac artery. This has been present ever since she had left iliac damage with a prior back surgery.     She was stable until 1/18, when she developed palpitations and exertional dyspnea.  She was admitted to [facility] with atrial fibrillation/RVR and acute systolic CHF.  EF was found to be 30-35%, down from 50-55% in 2015.  She was diuresed and underwent DCCV back to NSR.  She subsequently had atrial fibrillation ablation in 4/18.  She went back into atrial fibrillation in 5/19 and had DCCV back today NSR.    Echo was done 6/19 and reviewed, EF 55%, mild LVH, moderate diastolic dysfunction, normal RV size and systolic function.    With recurrent atrial fibrillation, she was admitted in 10/19 to start Tikosyn.  She was cardioverted back to NSR.   She had a recent admission in 2/22 with lightheadedness/presyncope that appeared to correlate with runs of VT.  She was on Tikosyn for atrial fibrillation though VT not thought to be due to Tikosyn (not polymorphic, QT not prolonged).  Tikosyn was stopped and amiodarone was started.  Echo in 2/22 showed EF 50-55% but cMRI (2/22) showed EF 40% with subendocardial scar in the basal to mid inferolateral wall, RV EF was 38%.  She had coronary angiography showing  no obstructive disease.  VT subsided and she was sent home on amiodarone.    09/13/20, she had 3 episodes of presyncope in succession while sitting at a desk.  No prodrome, each episode of severe lightheadedness lasted maybe 30 seconds.  She did not pass out. Because of these events, she came to the ER. She was admitted for observation and amiodarone gtt was restarted.  She had no VT overnight.  She remained in atypical atrial flutter. (was in atypical flutter at last admission). She had a single chamber Medtronic ICD placed 09/15/20. Discharge weight 256 lbs.   She had post hospital f/u on 09/22/20 and was noted to be back in atrial flutter w/ CVR. She was set up for repeat DCCV 3 weeks later. S/p DCCV on 10/11/20 and converted back to NSR.   She had COVID-19 in 9/22.   Follow up 09/01/21, she was volume overloaded and instructed to take metolazone 2.5 + extra 40 KCL x 2 days.  Multiple episodes of VT on 09/06/21, received ATP but no shocks. Follow up with EP 09/08/21 and started on mexiletine 250 bid.  On 11/28/21, she had VT with ATP and 1 ICD shock.  She had not taken amiodarone or mexiletine for a couple of days when this occurred.  She saw Dr. Elberta Fortis, meds were not changed.   Echo in 8/23 showed EF 60-65%, moderate LVH, normal RV, normal IVC.  She was noted to be in atrial fibrillation in 4/24, back in NSR when she came in for DCCV.   Today she returns for HF follow up. She is not very active due to back pain.  She walks with a walker.  No dyspnea walking around her house.  No chest pain.  No lightheadedness.  No claudication.  She had an ulcer on a toe on her left foot but this has healed. She is in NSR today, no palpitations. Weight up 7 lbs.   ECG (personally reviewed): NSR, 1st degree AVB with PR 274 msec, RBBB  Medtronic device interrogation: No AF, no VT.   Labs (5/19): K 3.9, creatinine 0.78 Labs (6/19): K 4.4, creatinine 0.88 Labs (12/19): K 4.2, creatinine 0.85, TGs 322, LDL 77 Labs  (2/20): LDL 46, TGs 362 Labs (3/20): K 4.7, creatinine 1.07 Labs (11/20): TGs 282, LDL 92 Labs (3/22): K 4.1, creatinine 1.23 Labs (4/22): K 4.2, creatinine 1.2, LFTs normal, TSH mildly elevated, free T4 mildly elevated, normal T3.  Labs (6/22): TSH mildly elevated, free T3 and T4 normal Labs (7/22): TGs 292, LDL 94 Labs (10/22): K 3.8, creatinine 1.04, BNP 62, hgb 14.1, LFTs normal Labs (2/23): K 3.5, creatinine 1.75, mag 2.5 Labs (3/23): TSH 8.31 Labs (5/23): BNP 542, K 3.8, creatinine 1.7 Labs (8/23): hgb 14.2, K 4, creatinine 1.66 Labs (11/23): normal TSH Labs (12/23): K 3.6, creatinine 1.35, normal LFTs Labs (4/24): K 4.2, creatinine 1.54, TSH low at 0.086, free T4 elevated (Synthroid stopped)  PMH: 1.Type II diabetes 2. HTN 3. Hyperlipidemia 4. COPD: has quit smoking.  5. CAD: Coronary CTA (4/18) with nonobstructive coronary disease, calcium score 335 Agatston units (82nd percentile).  - Coronary angiography (2/22) with no obstructive disease 6. Paroxysmal atrial fibrillation: First noted in 4/15.  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   - Event monitor (4/15) with no atrial fibrillation.  - Admission with atrial fibrillation/RVR in 1/18.  - Atrial fibrillation ablation in 4/18.  - DCCV in 5/19.  - Tikosyn started + DCCV in 10/19. VT => Tikosyn stopped and amiodarone begun in 3/22.   - S/p single chamber MDT ICD 09/15/20 - DCCV to NSR in 3/22.  7. Chronic systolic CHF: ?tachycardia-mediated as first noted in setting of atrial fibrillation with RVR.  - TEE (4/15) with EF 50-55%, mild LVH, grade III-IV plaque in descending thoracic aorta, RV normal, no PFO, peak RV-RA gradient 42 mmHg.   - Echo (1/18) with EF 30-35%, diffuse hypokinesis, moderate LVH, mildly dilated RV, PASP 55 mmHg.  - Echo (4/18) with EF 50%, anterolateral/inferolateral mild hypokinesis, mild aortic stenosis, IVC normal.  - Echo (6/19) with EF 55%, mild LVH, moderate  diastolic dysfunction, normal RV size and systolic function.  - cMRI (2/22) with EF 40% with subendocardial scar in the basal to mid inferolateral wall, RV EF was 38%. - Echo (2/22) with EF 50-55%, normal RV.  - Echo (8/23): EF 60-65%, moderate LVH, normal RV, normal IVC.  7. H/o diskectomy. 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 Lower extremity arterial dopplers (5/15) with occluded left external iliac artery.  - ABIs (5/18): 1.16 (normal) on right, 0.56 on left.  - CTA (5/24): Right anterior tibial and peroneal arteries occluded, left CIA/EIA/internal iliac artery occluded with reconstitution of SFA.  9. Aortic stenosis: Mild on 4/18 echo.  10. OSA: Uses CPAP 11. VT: 2/22.  No significant CAD on cath in 2/22.  cMRI with EF  40% with subendocardial scar in the basal to mid inferolateral wall, RV EF was 38%. - Medtronic ICD placed 3/22.  12. COVID-19 9/22 13. Hypothyroidism => hyperthyroidism  Current Outpatient Medications  Medication Sig Dispense Refill   acetaminophen (TYLENOL) 500 MG tablet Take 500 mg by mouth every 6 (six) hours as needed for moderate pain or headache.     amiodarone (PACERONE) 200 MG tablet Take 200 mg by mouth daily.     apixaban (ELIQUIS) 5 MG TABS tablet Take 1 tablet (5 mg total) by mouth 2 (two) times daily. 60 tablet 11   BD PEN NEEDLE NANO 2ND GEN 32G X 4 MM MISC See admin instructions.     dapagliflozin propanediol (FARXIGA) 10 MG TABS tablet TAKE 1 TABLET BY MOUTH EVERY DAY BEFORE BREAKFAST 90 tablet 3   eplerenone (INSPRA) 25 MG tablet TAKE 1 TABLET BY MOUTH EVERY DAY 90 tablet 3   fenofibrate (TRICOR) 145 MG tablet TAKE 1 TABLET BY MOUTH EVERY DAY 90 tablet 3   furosemide (LASIX) 40 MG tablet Take 80 mg by mouth 2 (two) times daily.     gabapentin (NEURONTIN) 300 MG capsule Take 300 mg by mouth daily as needed (pain).     HUMALOG MIX 75/25 KWIKPEN (75-25) 100 UNIT/ML KwikPen Take 40 units with morning meal and 30 units with evening  meal 90 mL 2   icosapent Ethyl (VASCEPA) 1 g capsule TAKE 2 CAPSULES BY MOUTH TWICE A DAY 120 capsule 5   KLOR-CON M10 10 MEQ tablet TAKE 2 TABLETS BY MOUTH TWICE A DAY 360 tablet 3   LIVALO 4 MG TABS TAKE 1 TABLET BY MOUTH EVERY DAY 90 tablet 3   metoprolol succinate (TOPROL-XL) 25 MG 24 hr tablet Take 0.5 tablets (12.5 mg total) by mouth daily. 45 tablet 3   mexiletine (MEXITIL) 150 MG capsule Take 2 capsules (300 mg total) by mouth 2 (two) times daily. 360 capsule 3   Multiple Vitamin (MULTIVITAMIN WITH MINERALS) TABS tablet Take 1 tablet by mouth daily.     ONETOUCH VERIO test strip 1 each 3 (three) times daily.     rOPINIRole (REQUIP) 0.25 MG tablet Take 0.25 mg by mouth as needed.     sacubitril-valsartan (ENTRESTO) 24-26 MG Take 1 tablet by mouth 2 (two) times daily. 180 tablet 3   No current facility-administered medications for this encounter.    Allergies:   Neomycin-bacitracin zn-polymyx, Sulfonamide derivatives, Sulfa antibiotics, Ciprofloxacin, and Spironolactone   Social History:  The patient  reports that she quit smoking about 2 years ago. Her smoking use included cigarettes. She has a 50.00 pack-year smoking history. She has never used smokeless tobacco. She reports that she does not drink alcohol and does not use drugs.   Family History:  The patient's family history includes Cancer in her mother; Diabetes in her father, mother, and another family member; Factor V Leiden deficiency in her daughter; Hypertension in her mother; Melanoma in an other family member; Obesity in her father and mother.   ROS:  Please see the history of present illness.   All other systems are personally reviewed and negative.   BP (!) 90/50   Pulse 60   Wt 117 kg (258 lb)   SpO2 94%   BMI 41.64 kg/m   Wt Readings from Last 3 Encounters:  12/06/22 117 kg (258 lb)  11/09/22 113.9 kg (251 lb)  11/03/22 113.4 kg (250 lb)   PHYSICAL EXAM: General: NAD Neck: No JVD, no thyromegaly or thyroid  nodule.  Lungs: Clear to auscultation bilaterally with normal respiratory effort. CV: Nondisplaced PMI.  Heart regular S1/S2, no S3/S4, no murmur.  No peripheral edema.  No carotid bruit.  Difficult to palpate pedal pulses.  Abdomen: Soft, nontender, no hepatosplenomegaly, no distention.  Skin: Intact without lesions or rashes.  Neurologic: Alert and oriented x 3.  Psych: Normal affect. Extremities: No clubbing or cyanosis.  HEENT: Normal.   Recent Labs: 09/25/2022: B Natriuretic Peptide 91.8 12/06/2022: ALT 18; BUN 21; Creatinine, Ser 1.49; Hemoglobin 15.1; Platelets 159; Potassium 4.0; Sodium 136; TSH 0.865  Personally reviewed   ASSESSMENT AND PLAN: 1. H/o VT: noted 08/2020. 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 Had been on Tikosyn but this was not the typical Tikosyn-induced polymorphic VT and she had had no changes in Tikosyn dosing prior to occurrence. cMRI showed subendocardial LGE in the basal to mid inferolateral wall. This appeared to be a coronary disease pattern however coronary angiogram did not show significant coronary disease. She was transitioned from Tikosyn to amiodarone and got Medtronic single chamber ICD on 09/15/20. VT on 09/06/21 and was started on mexiletine.  VT again on 11/28/21 after missing amiodarone and mexiletine. No VT since then on device interrogation.  - Continue amiodarone 200 mg daily. Check LFTs, TSH/free T3/free T4 today.  She will need a regular eye exam.  - Continue mexiletine 300 mg bid. 2. Atrial fibrillation/flutter: Paroxysmal.  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 She was admitted in 1/18 with atrial fibrillation/RVR and CHF.  She was diuresed and had DCCV back to NSR. She had atrial fibrillation ablation in 4/18.  In 5/19, she went into atrial fibrillation transiently.  In 10/19, she was back in atrial fibrillation and was admitted for Tikosyn initiation and cardioversion.  Atypical atrial flutter noted  in 12/21 with DCCV to NSR. As outlined above, now off Tikosyn and on amiodarone. Underwent repeat DCCV in 3/22. NSR today. - Continue amiodarone. - Continue Eliquis 5 mg bid.  3. Claudication: Left leg claudication with absent left PT pulse.  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  CTA in 5/24 showed right anterior tibial and peroneal arteries occluded, left CIA/EIA/internal iliac artery occluded with reconstitution of SFA.  - Followup with [facility] (Dr. Randie Heinz).  4. Chronic systolic => diastolic CHF: EF 09-81% on echo in 1/18, down from 50-55% in 4/15.  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 In 2/22, cMRI showed LV EF 40% with RV E 38%, inferolateral subendocardial LGE, but echo in 2/22 showed EF 50-55%.  Echo in 8/23 showed EF 60-65%, moderate LVH, normal RV, normal IVC.  NYHA class III, functional status limited by body habitus and chronic back pain. She is not volume overloaded by exam.  - Continue Entresto 24/26 mg bid.    BMET today - Continue Lasix 80 mg bid. - Continue Toprol XL 12.5 mg daily.   - Continue eplerenone 25 mg daily.  - Continue dapagliflozin 10 mg daily.  5. CAD: Coronary CTA in 2018 showed coronary calcium score in the 82nd percentile with nonobstructive coronary disease. Cath in 2/22 showed only luminal irregularities. She denies anginal symptoms.  - Not on ASA due to Eliquis.  - Continue Vascepa and Livalo. Check lipids today.  6. COPD:  History of smoking. Quit 2/22.  7. OSA: Continue CPAP.  8. Obesity: Body mass index is 41.64 kg/m. - Unable to tolerate semaglutide due to nausea. - I will refer to pharmacy clinic to see if she can get [facility].  9. CKD: Stage 3. BMET today. 10. Thyroid: H/o hypothyroidism on Synthroid.  Most recently, TSH suppressed with high free T4. ?Development of hyperthyroidism related to  amiodarone.  - Synthroid stopped.  - Repeat TSH, free T4/T3 today.  - She has appointment with Dr. Talmage Nap for endocrinology.   Follow up in 4 months with APP  Signed, Marca Ancona, MD  12/06/2022  [facility] [address] [facility] [place] [address] [phone] (office) [phone] (fax)

## 2022-12-06 NOTE — Patient Instructions (Signed)
There has been no changes to your medications.  Labs done today, your results will be available in MyChart, we will contact you for abnormal readings.  You have been referred to the [facility]ARE [facility]. They will call you to arrange your appointment.  Your physician recommends that you schedule a follow-up appointment in: 4 months  If you have any questions or concerns before your next appointment please send [name] a message through [place] or call our office at [phone].    TO LEAVE A MESSAGE FOR THE NURSE SELECT OPTION 2, PLEASE LEAVE A MESSAGE INCLUDING: YOUR NAME DATE OF BIRTH CALL BACK NUMBER REASON FOR CALL**this is important as we prioritize the call backs  YOU WILL RECEIVE A CALL BACK THE SAME DAY AS LONG AS YOU CALL BEFORE 4:00 PM  At the [facility], you and your health needs are our priority. As part of our continuing mission to provide you with exceptional heart care, we have created designated Provider Care Teams. These Care Teams include your primary Cardiologist (physician) and Advanced Practice Providers (APPs- Physician Assistants and Nurse Practitioners) who all work together to provide you with the care you need, when you need it.   You may see any of the following providers on your designated Care Team at your next follow up: Dr Arvilla Meres Dr Marca Ancona Dr. Marcos Eke, NP Robbie Lis, [place] [facility] [place], [place] Brynda Peon, NP Karle Plumber, PharmD   Please be sure to bring in all your medications bottles to every appointment.    Thank you for choosing [facility]

## 2022-12-07 LAB — T3, FREE: T3, Free: 3 pg/mL (ref 2.0–4.4)

## 2022-12-14 ENCOUNTER — Ambulatory Visit (INDEPENDENT_AMBULATORY_CARE_PROVIDER_SITE_OTHER): Payer: Medicare Other

## 2022-12-14 DIAGNOSIS — I472 Ventricular tachycardia, unspecified: Secondary | ICD-10-CM | POA: Diagnosis not present

## 2022-12-14 LAB — CUP PACEART REMOTE DEVICE CHECK
Battery Remaining Longevity: 120 mo
Battery Voltage: 3.01 V
Brady Statistic RV Percent Paced: 0.09 %
Date Time Interrogation Session: 20240530002207
HighPow Impedance: 89 Ohm
Implantable Lead Connection Status: 753985
Implantable Lead Implant Date: 20220302
Implantable Lead Location: 753860
Implantable Pulse Generator Implant Date: 20220302
Lead Channel Impedance Value: 779 Ohm
Lead Channel Impedance Value: 893 Ohm
Lead Channel Pacing Threshold Amplitude: 1.875 V
Lead Channel Pacing Threshold Pulse Width: 0.4 ms
Lead Channel Sensing Intrinsic Amplitude: 9 mV
Lead Channel Sensing Intrinsic Amplitude: 9 mV
Lead Channel Setting Pacing Amplitude: 3.75 V
Lead Channel Setting Pacing Pulse Width: 0.4 ms
Lead Channel Setting Sensing Sensitivity: 0.3 mV
Zone Setting Status: 755011
Zone Setting Status: 755011

## 2022-12-18 ENCOUNTER — Encounter: Payer: Self-pay | Admitting: Cardiology

## 2022-12-20 ENCOUNTER — Ambulatory Visit: Payer: Medicare Other | Admitting: Vascular Surgery

## 2022-12-25 ENCOUNTER — Telehealth: Payer: Self-pay | Admitting: Pharmacist

## 2022-12-25 NOTE — Telephone Encounter (Signed)
  Pt said, her daughter have a doctor's appt on 06/13. She is on a wheelchair and can't go to the appt by herself. She would like to know if her appt with pharmD can be change to  virtual appt

## 2022-12-26 ENCOUNTER — Encounter: Payer: Self-pay | Admitting: Pharmacist

## 2022-12-26 NOTE — Telephone Encounter (Signed)
Appt is for GLP referral to discuss Mounjaro. This can be done with a phone visit. Visit type has been updated. Tried calling pt 3x, sounded like someone would pick up the phone but then no one every talked, couldn't leave a message. I have sent pt a mychart message with the update.

## 2022-12-27 ENCOUNTER — Telehealth: Payer: Self-pay | Admitting: Pharmacist

## 2022-12-28 ENCOUNTER — Ambulatory Visit: Payer: Medicare Other | Attending: Cardiology | Admitting: Student

## 2022-12-28 ENCOUNTER — Other Ambulatory Visit (HOSPITAL_COMMUNITY): Payer: Self-pay

## 2022-12-28 VITALS — Ht 66.0 in | Wt 260.0 lb

## 2022-12-28 DIAGNOSIS — Z6841 Body Mass Index (BMI) 40.0 and over, adult: Secondary | ICD-10-CM

## 2022-12-28 NOTE — Progress Notes (Signed)
HPI: Heidi Stark is a 82 y.o. female patient referred to pharmacy clinic by Dr. Shirlee Latch to initiate weight loss therapy with GLP1-RA.  Most recent BMI 42 weight  260 lbs. PMH significant for PAF,PAD,HTN,CHF,OSA, COPD,CKD Stage 3,HDL, hx thromboembolism, obesity  Consultation provided over the phone. Patient reports she was on semaglutide before she went up to 2 mg weekly dose and she started having severe nausea and vomiting from it so she has to stop taking Ozempic. Also she was not making any dietary changes like low carb, low fat small meals that is why she was not able to tolerate it well. Currently she is 75/25 insulin twice daily (40 units am and  and 30 units pm) we discussed current diet and suggest some dietary modifications. Discussed Mounjaro and Ozempic dose titration. Dicussed side effects and how dietary changes would improve tolerability.      Current weight management medications: none  Previously tried meds: Ozempic - stopped due to severe nausea and vomiting at 2 mg weekly dose   Current meds that may affect weight: Insulin  Baseline weight/BMI: 42 mg/kg 2 260 lbs    Diet:  -Breakfast: bowel of cereal with milk  -Lunch: salads with deli meat -Dinner: meat vegetables,  green beans, carrots and potatoes  -Snacks: none  -Drinks: water, occasionally artificially sweeten tea, diet soda     Exercise: none due to back problem   Family History:  Confirmed patient has no personal or family history of medullary thyroid carcinoma (MTC) or Multiple Endocrine Neoplasia syndrome type 2 (MEN 2). No hx of pancreatitis   Social History:  Alcohol:none  Smoking:former smoker quit 3 years ago  Labs: Lab Results  Component Value Date   HGBA1C 6.0 12/21/2021    Wt Readings from Last 1 Encounters:  12/28/22 260 lb (117.9 kg)    BP Readings from Last 1 Encounters:  12/06/22 (!) 90/50   Pulse Readings from Last 1 Encounters:  12/06/22 60       Component  Value Date/Time   CHOL 174 12/06/2022 1128   CHOL 170 06/02/2019 0913   TRIG 346 (H) 12/06/2022 1128   HDL 34 (L) 12/06/2022 1128   HDL 31 (L) 06/02/2019 0913   CHOLHDL 5.1 12/06/2022 1128   VLDL 69 (H) 12/06/2022 1128   LDLCALC 71 12/06/2022 1128   LDLCALC 92 06/02/2019 0913   LDLCALC 77 06/28/2018 1410   LDLDIRECT 42.0 11/30/2014 1226    Past Medical History:  Diagnosis Date   Arthritis    "hands, wrists" (11/07/2016)   CHF (congestive heart failure) (HCC)    Chronic lower back pain    Constipation    DVT (deep venous thrombosis) (HCC)    History of blood transfusion 1990   "related to OR"   History of kidney stones    Hyperlipidemia    Hypertension    Joint pain    Lower extremity edema    OSA on CPAP    Persistent atrial fibrillation (HCC)    Pneumonia    "several times" (11/07/2016)   Type II diabetes mellitus (HCC)     Current Outpatient Medications on File Prior to Visit  Medication Sig Dispense Refill   acetaminophen (TYLENOL) 500 MG tablet Take 500 mg by mouth every 6 (six) hours as needed for moderate pain or headache.     amiodarone (PACERONE) 200 MG tablet Take 200 mg by mouth daily.     apixaban (ELIQUIS) 5 MG TABS tablet Take 1 tablet (5 mg  total) by mouth 2 (two) times daily. 60 tablet 11   BD PEN NEEDLE NANO 2ND GEN 32G X 4 MM MISC See admin instructions.     dapagliflozin propanediol (FARXIGA) 10 MG TABS tablet TAKE 1 TABLET BY MOUTH EVERY DAY BEFORE BREAKFAST 90 tablet 3   eplerenone (INSPRA) 25 MG tablet TAKE 1 TABLET BY MOUTH EVERY DAY 90 tablet 3   fenofibrate (TRICOR) 145 MG tablet TAKE 1 TABLET BY MOUTH EVERY DAY 90 tablet 3   furosemide (LASIX) 40 MG tablet Take 80 mg by mouth 2 (two) times daily.     gabapentin (NEURONTIN) 300 MG capsule Take 300 mg by mouth daily as needed (pain).     HUMALOG MIX 75/25 KWIKPEN (75-25) 100 UNIT/ML KwikPen Take 40 units with morning meal and 30 units with evening meal 90 mL 2   icosapent Ethyl (VASCEPA) 1 g  capsule TAKE 2 CAPSULES BY MOUTH TWICE A DAY 120 capsule 5   KLOR-CON M10 10 MEQ tablet TAKE 2 TABLETS BY MOUTH TWICE A DAY 360 tablet 3   LIVALO 4 MG TABS TAKE 1 TABLET BY MOUTH EVERY DAY 90 tablet 3   metoprolol succinate (TOPROL-XL) 25 MG 24 hr tablet Take 0.5 tablets (12.5 mg total) by mouth daily. 45 tablet 3   mexiletine (MEXITIL) 150 MG capsule Take 2 capsules (300 mg total) by mouth 2 (two) times daily. 360 capsule 3   Multiple Vitamin (MULTIVITAMIN WITH MINERALS) TABS tablet Take 1 tablet by mouth daily.     ONETOUCH VERIO test strip 1 each 3 (three) times daily.     rOPINIRole (REQUIP) 0.25 MG tablet Take 0.25 mg by mouth as needed.     sacubitril-valsartan (ENTRESTO) 24-26 MG Take 1 tablet by mouth 2 (two) times daily. 180 tablet 3   No current facility-administered medications on file prior to visit.    Allergies  Allergen Reactions   Neomycin-Bacitracin Zn-Polymyx Rash   Sulfonamide Derivatives Other (See Comments)    Stomach cramps   Sulfa Antibiotics Other (See Comments)   Ciprofloxacin Hives, Itching and Rash    Pt doesn't remember having any reaction to cipro   Spironolactone Rash   Class 3 obesity   Assessment/Plan: Current BMI 42 Weight 260 lbs  tried semaglutide in the past, was not able to tolerate 2 mg weekly dose due to severe nausea and vomiting Discussed current diet in detail and suggested some changes ( mainly to lower carb intake and fat intake  small meals) to minimize GI related S/E from GLP1 agent  Reduced mobility due to chronic back pain Will apply for PA for semaglutide or tirzepatide  Will inform patient upon approval  Follow up every 4 weeks for dose titration  Can consider slow dose titration considering hx of GLP1 intolerance with aim to reduce insulin dose to see weight benefit from the therapy    Carmela Hurt, Pharm.D [facility] of [facility] [address] [place], [place] [address]  Phone: [phone]; Fax: [phone]

## 2022-12-28 NOTE — Assessment & Plan Note (Addendum)
Assessment/Plan: Current BMI 42 Weight 260 lbs  Had tried semaglutide was not able to tolerate 2 mg weekly dose due to severe nausea and vomiting Discussed current diet in detail and suggested some changes ( mainly to lower carb intake and fat intake, small meals) to minimize GI related S/E from GLP1 agent  Reduced mobility due to chronic back pain Will apply for PA for semaglutide or tirzepatide  Will inform patient upon approval  Follow up every 4 weeks for dose titration  Can consider slow dose titration considering hx of GLP1 intolerance with aim to reduce insulin dose to see weight benefit from the therapy

## 2022-12-28 NOTE — Telephone Encounter (Signed)
Checked GLP1 coverage. Per test claims no PA required on Trulicity, Ozempic,and Victoza. The most cost efficient for patient would be Victoza. Looks like she is either in the donut hole or has a deductible to meet.

## 2023-01-01 ENCOUNTER — Other Ambulatory Visit (HOSPITAL_COMMUNITY): Payer: Self-pay

## 2023-01-01 NOTE — Telephone Encounter (Addendum)
She has a International aid/development worker, making her excluded from using vouchers towards the copay, so I assume same would apply for patient assistance, but I am not familiar with the requirements. Based on the test claim it's covered without a PA. Her cost for 1 month of Mounjaro would be around $260

## 2023-01-01 NOTE — Telephone Encounter (Signed)
Patient would qualify for patient assistance. Patient will print the application form and will drop it at our [facility] office with necessary other documents.

## 2023-01-02 NOTE — Progress Notes (Signed)
Remote ICD transmission.   

## 2023-01-10 ENCOUNTER — Ambulatory Visit: Payer: Medicare Other | Admitting: Vascular Surgery

## 2023-01-10 ENCOUNTER — Telehealth: Payer: Medicare Other

## 2023-01-10 ENCOUNTER — Encounter: Payer: Self-pay | Admitting: Vascular Surgery

## 2023-01-10 VITALS — BP 118/65 | HR 56 | Temp 98.0°F | Resp 20

## 2023-01-10 DIAGNOSIS — I739 Peripheral vascular disease, unspecified: Secondary | ICD-10-CM | POA: Diagnosis not present

## 2023-01-10 NOTE — Progress Notes (Signed)
Patient ID: Heidi Stark, female   DOB: September 09, 1940, 82 y.o.   MRN: [id number]  Reason for Consult: Follow-up   Referred by Zola Button, Grayling Congress, *  Subjective:     HPI:  Heidi Stark is a 82 y.o. female history of emergent iliac artery repair by Dr. Nedra Hai during spinal surgery.  Recently evaluated with wound on the left middle toe left leg.  She now follows up with CT scan.  States that wounds have mostly healed at this time pain.  Past Medical History:  Diagnosis Date   Arthritis    "hands, wrists" (11/07/2016)   CHF (congestive heart failure) (HCC)    Chronic lower back pain    Constipation    DVT (deep venous thrombosis) (HCC)    History of blood transfusion 1990   "related to OR"   History of kidney stones    Hyperlipidemia    Hypertension    Joint pain    Lower extremity edema    OSA on CPAP    Persistent atrial fibrillation (HCC)    Pneumonia    "several times" (11/07/2016)   Type II diabetes mellitus (HCC)    Family History  Problem Relation Age of Onset   Diabetes Mother    Hypertension Mother    Cancer Mother    Obesity Mother    Diabetes Father    Obesity Father    Diabetes Other    Melanoma Other    Factor V Leiden deficiency Daughter    Past Surgical History:  Procedure Laterality Date   ATRIAL FIBRILLATION ABLATION N/A 11/07/2016   Procedure: Atrial Fibrillation Ablation;  Surgeon: Will Jorja Loa, MD;  Location: [facility] INVASIVE CV LAB;  Service: Cardiovascular;  Laterality: N/A;   BACK SURGERY     CARDIAC CATHETERIZATION     CARDIOVERSION N/A 08/11/2016   Procedure: CARDIOVERSION;  Surgeon: Laurey Morale, MD;  Location: [facility] ENDOSCOPY;  Service: Cardiovascular;  Laterality: N/A;   CARDIOVERSION N/A 12/03/2017   Procedure: CARDIOVERSION;  Surgeon: Laurey Morale, MD;  Location: [facility] ENDOSCOPY;  Service: Cardiovascular;  Laterality: N/A;   CARDIOVERSION N/A 05/02/2018   Procedure: CARDIOVERSION;  Surgeon: Chrystie Nose, MD;  Location: [facility]  ENDOSCOPY;  Service: Cardiovascular;  Laterality: N/A;   CARDIOVERSION N/A 06/23/2020   Procedure: CARDIOVERSION;  Surgeon: Meriam Sprague, MD;  Location: [facility] ENDOSCOPY;  Service: Cardiovascular;  Laterality: N/A;   CARDIOVERSION N/A 10/11/2020   Procedure: CARDIOVERSION;  Surgeon: Laurey Morale, MD;  Location: [facility] ENDOSCOPY;  Service: Cardiovascular;  Laterality: N/A;   CARPAL TUNNEL RELEASE     CATARACT EXTRACTION W/ INTRAOCULAR LENS  IMPLANT, BILATERAL Bilateral    COLON SURGERY  1990   vein graft and colon repair after nicked artery with back surgert   COLONOSCOPY     CORONARY ANGIOGRAPHY N/A 09/08/2020   Procedure: CORONARY ANGIOGRAPHY;  Surgeon: Laurey Morale, MD;  Location: [facility] INVASIVE CV LAB;  Service: Cardiovascular;  Laterality: N/A;   CYSTECTOMY     between bladder and kidneys   GANGLION CYST EXCISION Left    ICD IMPLANT N/A 09/15/2020   Procedure: ICD IMPLANT;  Surgeon: Regan Lemming, MD;  Location: [facility] INVASIVE CV LAB;  Service: Cardiovascular;  Laterality: N/A;   KNEE CARTILAGE SURGERY Right 1960s   LUMBAR DISC SURGERY  1990   REPAIR ILIAC ARTERY  1990   vein graft and colon repair after nicked artery with back surgert   TEE WITHOUT CARDIOVERSION N/A 10/31/2013   Procedure:  TRANSESOPHAGEAL ECHOCARDIOGRAM (TEE);  Surgeon: Laurey Morale, MD;  Location: [facility] ENDOSCOPY;  Service: Cardiovascular;  Laterality: N/A;   TUBAL LIGATION      Short Social History:  Social History   Tobacco Use   Smoking status: Former    Packs/day: 1.00    Years: 50.00    Additional pack years: 0.00    Total pack years: 50.00    Types: Cigarettes    Quit date: 08/17/2020    Years since quitting: 2.4   Smokeless tobacco: Never   Tobacco comments:    Quit 2022. [place]  Substance Use Topics   Alcohol use: No    Alcohol/week: 0.0 standard drinks of alcohol    Allergies  Allergen Reactions   Neomycin-Bacitracin Zn-Polymyx Rash   Sulfonamide Derivatives Other (See Comments)     Stomach cramps   Sulfa Antibiotics Other (See Comments)   Ciprofloxacin Hives, Itching and Rash    Pt doesn't remember having any reaction to cipro   Spironolactone Rash    Current Outpatient Medications  Medication Sig Dispense Refill   acetaminophen (TYLENOL) 500 MG tablet Take 325 mg by mouth every 6 (six) hours as needed for moderate pain or headache.     amiodarone (PACERONE) 200 MG tablet Take 200 mg by mouth daily.     apixaban (ELIQUIS) 5 MG TABS tablet Take 1 tablet (5 mg total) by mouth 2 (two) times daily. 60 tablet 11   BD PEN NEEDLE NANO 2ND GEN 32G X 4 MM MISC See admin instructions.     dapagliflozin propanediol (FARXIGA) 10 MG TABS tablet TAKE 1 TABLET BY MOUTH EVERY DAY BEFORE BREAKFAST 90 tablet 3   eplerenone (INSPRA) 25 MG tablet TAKE 1 TABLET BY MOUTH EVERY DAY 90 tablet 3   fenofibrate (TRICOR) 145 MG tablet TAKE 1 TABLET BY MOUTH EVERY DAY 90 tablet 3   furosemide (LASIX) 40 MG tablet Take 80 mg by mouth 2 (two) times daily.     gabapentin (NEURONTIN) 300 MG capsule Take 300 mg by mouth daily as needed (pain).     HUMALOG MIX 75/25 KWIKPEN (75-25) 100 UNIT/ML KwikPen Take 40 units with morning meal and 30 units with evening meal 90 mL 2   icosapent Ethyl (VASCEPA) 1 g capsule TAKE 2 CAPSULES BY MOUTH TWICE A DAY 120 capsule 5   KLOR-CON M10 10 MEQ tablet TAKE 2 TABLETS BY MOUTH TWICE A DAY 360 tablet 3   LIVALO 4 MG TABS TAKE 1 TABLET BY MOUTH EVERY DAY 90 tablet 3   metoprolol succinate (TOPROL-XL) 25 MG 24 hr tablet Take 0.5 tablets (12.5 mg total) by mouth daily. 45 tablet 3   mexiletine (MEXITIL) 150 MG capsule Take 2 capsules (300 mg total) by mouth 2 (two) times daily. 360 capsule 3   Multiple Vitamin (MULTIVITAMIN WITH MINERALS) TABS tablet Take 1 tablet by mouth daily.     ONETOUCH VERIO test strip 1 each 3 (three) times daily.     rOPINIRole (REQUIP) 0.25 MG tablet Take 0.25 mg by mouth as needed.     sacubitril-valsartan (ENTRESTO) 24-26 MG Take 1 tablet by  mouth 2 (two) times daily. 180 tablet 3   No current facility-administered medications for this visit.    Review of Systems  Constitutional:  Constitutional negative. HENT: HENT negative.  Eyes: Eyes negative.  Respiratory: Respiratory negative.  Cardiovascular: Cardiovascular negative.  GI: Gastrointestinal negative.  Musculoskeletal: Musculoskeletal negative.  Skin: Skin negative.  Neurological: Neurological negative. Hematologic: Hematologic/lymphatic negative.  Psychiatric: Psychiatric  negative.        Objective:  Objective  Vitals:   01/10/23 1401  BP: 118/65  Pulse: (!) 56  Resp: 20  Temp: 98 F (36.7 C)  SpO2: 95%      Physical Exam HENT:     Head: Normocephalic.     Mouth/Throat:     Mouth: Mucous membranes are moist.  Eyes:     Pupils: Pupils are equal, round, and reactive to light.  Cardiovascular:     Rate and Rhythm: Normal rate.  Pulmonary:     Effort: Pulmonary effort is normal.  Abdominal:     General: Abdomen is flat.  Musculoskeletal:     Cervical back: Normal range of motion and neck supple.     Right lower leg: No edema.  Skin:    General: Skin is warm.     Capillary Refill: Capillary refill takes less than 2 seconds.  Neurological:     General: No focal deficit present.     Mental Status: She is alert.  Psychiatric:        Mood and Affect: Mood normal.        Behavior: Behavior normal.        Thought Content: Thought content normal.        Judgment: Judgment normal.    Data: Left Lower Extremity Vascular Impression:   1. 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 2. The left deep and superficial femoral arteries, as well as the left above and below-knee popliteal arteries are of normal  caliber and without a hemodynamically significant narrowing. 3. Predominantly two-vessel to the left lower leg via the left posterior tibial and peroneal arteries as the left anterior tibial artery occludes at the level of the distal tibia. Permanent arterial supply to the left foot is via the posterior tibial artery. A patent left-sided dorsalis pedis artery is not identified.   Right Lower Extremity Vascular Impression:   1. No evidence of hemodynamically significant stenosis affecting the outflow vasculature of the right lower extremity. 2. The right deep and superficial femoral arteries, as well as the right above and below-knee popliteal arteries are of normal caliber and without a hemodynamically significant narrowing. 3. Predominantly single-vessel runoff to the right lower leg and foot via the posterior tibial artery as the anterior and peroneal arteries occlude at the level of the mid/distal tibia. A patent right-sided dorsalis pedis artery is not identified.     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  She denies any rest pain and her wounds have mostly healed and she does have a toe pressure of 46 by last check.  We reviewed her CT scan today she does not appear to be candidate for any endovascular intervention and at the very least would require right to left femorofemoral bypass but would likely perform angiography prior to any intervention.  At this time given her lack of symptoms she does not need any treatment from a vascular standpoint and I will have her follow-up in 6 months with ABIs unless she develops issues before that time.  We have discussed the need to diligently protect her feet and legs.     Maeola Harman MD [facility]

## 2023-01-11 ENCOUNTER — Other Ambulatory Visit: Payer: Medicare Other | Admitting: Pharmacist

## 2023-01-20 ENCOUNTER — Other Ambulatory Visit (HOSPITAL_COMMUNITY): Payer: Self-pay | Admitting: Cardiology

## 2023-01-26 ENCOUNTER — Other Ambulatory Visit: Payer: Self-pay

## 2023-01-26 DIAGNOSIS — I739 Peripheral vascular disease, unspecified: Secondary | ICD-10-CM

## 2023-01-26 DIAGNOSIS — I7025 Atherosclerosis of native arteries of other extremities with ulceration: Secondary | ICD-10-CM

## 2023-02-20 ENCOUNTER — Other Ambulatory Visit: Payer: Self-pay | Admitting: Cardiology

## 2023-03-14 ENCOUNTER — Encounter (HOSPITAL_COMMUNITY): Payer: Self-pay | Admitting: Cardiology

## 2023-03-14 ENCOUNTER — Encounter: Payer: Self-pay | Admitting: Family Medicine

## 2023-03-14 NOTE — Telephone Encounter (Signed)
Are you ok with starting something or defer to PCP?

## 2023-03-15 ENCOUNTER — Other Ambulatory Visit: Payer: Self-pay | Admitting: Family Medicine

## 2023-03-15 ENCOUNTER — Ambulatory Visit: Payer: Medicare Other

## 2023-03-15 DIAGNOSIS — I472 Ventricular tachycardia, unspecified: Secondary | ICD-10-CM

## 2023-03-15 DIAGNOSIS — F439 Reaction to severe stress, unspecified: Secondary | ICD-10-CM

## 2023-03-15 LAB — CUP PACEART REMOTE DEVICE CHECK
Battery Remaining Longevity: 118 mo
Battery Voltage: 3.01 V
Brady Statistic RV Percent Paced: 0.17 %
Date Time Interrogation Session: 20240829001805
HighPow Impedance: 91 Ohm
Implantable Lead Connection Status: 753985
Implantable Lead Implant Date: 20220302
Implantable Lead Location: 753860
Implantable Pulse Generator Implant Date: 20220302
Lead Channel Impedance Value: 1083 Ohm
Lead Channel Impedance Value: 950 Ohm
Lead Channel Pacing Threshold Amplitude: 2 V
Lead Channel Pacing Threshold Pulse Width: 0.4 ms
Lead Channel Sensing Intrinsic Amplitude: 8.375 mV
Lead Channel Sensing Intrinsic Amplitude: 8.375 mV
Lead Channel Setting Pacing Amplitude: 4 V
Lead Channel Setting Pacing Pulse Width: 0.4 ms
Lead Channel Setting Sensing Sensitivity: 0.3 mV
Zone Setting Status: 755011
Zone Setting Status: 755011

## 2023-03-15 MED ORDER — LORAZEPAM 0.5 MG PO TABS
0.5000 mg | ORAL_TABLET | Freq: Three times a day (TID) | ORAL | 0 refills | Status: DC
Start: 2023-03-15 — End: 2023-05-19

## 2023-03-21 NOTE — Progress Notes (Signed)
Remote ICD transmission.   

## 2023-03-28 ENCOUNTER — Other Ambulatory Visit (HOSPITAL_COMMUNITY): Payer: Self-pay | Admitting: Cardiology

## 2023-04-03 ENCOUNTER — Ambulatory Visit: Payer: Medicare Other | Attending: Cardiology | Admitting: Cardiology

## 2023-04-03 ENCOUNTER — Encounter: Payer: Self-pay | Admitting: Cardiology

## 2023-04-03 VITALS — BP 118/76 | HR 59 | Ht 66.0 in | Wt 260.0 lb

## 2023-04-03 DIAGNOSIS — I4892 Unspecified atrial flutter: Secondary | ICD-10-CM | POA: Diagnosis not present

## 2023-04-03 DIAGNOSIS — I472 Ventricular tachycardia, unspecified: Secondary | ICD-10-CM | POA: Diagnosis not present

## 2023-04-03 DIAGNOSIS — I480 Paroxysmal atrial fibrillation: Secondary | ICD-10-CM | POA: Diagnosis not present

## 2023-04-03 DIAGNOSIS — I5022 Chronic systolic (congestive) heart failure: Secondary | ICD-10-CM

## 2023-04-03 DIAGNOSIS — D6869 Other thrombophilia: Secondary | ICD-10-CM

## 2023-04-03 DIAGNOSIS — I2510 Atherosclerotic heart disease of native coronary artery without angina pectoris: Secondary | ICD-10-CM

## 2023-04-03 NOTE — Progress Notes (Signed)
Electrophysiology Office Note:   Date:  04/03/2023  ID:  JOVANNI MONTE, DOB Nov 15, 1940, MRN [id number]  Primary Cardiologist: Marca Ancona, MD Electrophysiologist: Taheem Fricke Jorja Loa, MD      History of Present Illness:   Heidi Stark is a 82 y.o. female with h/o CHF, atrial fibrillation, VT, coronary artery disease seen today for routine electrophysiology followup.   Since last being seen in our clinic the patient reports doing she overall has been doing well.  She has noted no further arrhythmias.  She is in a wheelchair today, but does ambulate around her house.  She has no acute complaints.  She has had no arrhythmias on device interrogation.  she denies chest pain, palpitations, dyspnea, PND, orthopnea, nausea, vomiting, dizziness, syncope, edema, weight gain, or early satiety.   Review of systems complete and found to be negative unless listed in HPI.      EP Information / Studies Reviewed:    EKG is ordered today. Personal review as below.  EKG Interpretation Date/Time:  Tuesday April 03 2023 15:46:06 EDT Ventricular Rate:  59 PR Interval:  266 QRS Duration:  152 QT Interval:  510 QTC Calculation: 504 R Axis:   -81  Text Interpretation: Sinus bradycardia with 1st degree A-V block Left axis deviation Right bundle branch block When compared with ECG of 06-Dec-2022 11:07, No significant change was found Confirmed by Rionna Feltes ([id number]) on 04/03/2023 3:58:40 PM   ICD Interrogation-  reviewed in detail today,  See PACEART report.  Device History: Medtronic Single Chamber ICD implanted 09/15/20 for VT History of appropriate therapy: Yes History of AAD therapy: Yes; currently on amiodarone    Risk Assessment/Calculations:    CHA2DS2-VASc Score = 7   This indicates a 11.2% annual risk of stroke. The patient's score is based upon: CHF History: 1 HTN History: 1 Diabetes History: 1 Stroke History: 0 Vascular Disease History: 1 Age Score: 2 Gender Score: 1              Physical Exam:   VS:  BP 118/76 (BP Location: Right Arm, Patient Position: Sitting, Cuff Size: Large)   Pulse (!) 59   Ht 5\' 6"  (1.676 m)   Wt 260 lb (117.9 kg)   SpO2 96%   BMI 41.97 kg/m    Wt Readings from Last 3 Encounters:  04/03/23 260 lb (117.9 kg)  12/28/22 260 lb (117.9 kg)  12/06/22 258 lb (117 kg)     GEN: Well nourished, well developed in no acute distress NECK: No JVD; No carotid bruits CARDIAC: Regular rate and rhythm, no murmurs, rubs, gallops RESPIRATORY:  Clear to auscultation without rales, wheezing or rhonchi  ABDOMEN: Soft, non-tender, non-distended EXTREMITIES:  No edema; No deformity   ASSESSMENT AND PLAN:    Ventricular tachycardia/fibrillation s/p Medtronic single chamber ICD  euvolemic today Stable on an appropriate medical regimen Normal ICD function See Pace Art report No changes today Continue amiodarone and mexiletine  2.  Paroxysmal atrial fibrillation: Currently on Eliquis.  Minimal noted on device interrogation.  3.  Chronic systolic heart failure: Ejection fraction has improved.  Has follow-up with heart failure cardiology  4.  Coronary artery disease: Elevated calcium score.  Continue Crestor  5.  Obstructive sleep apnea: CPAP compliance encouraged  6.  Secondary hypercoagulable state: Currently on Eliquis  Disposition:   Follow up with EP APP in 6 months   Signed, Brealynn Contino Jorja Loa, MD

## 2023-04-03 NOTE — Patient Instructions (Signed)
Unplug your monitor and plug it back in, then call the device clinic at [phone]

## 2023-04-04 ENCOUNTER — Encounter: Payer: Self-pay | Admitting: Family Medicine

## 2023-04-05 ENCOUNTER — Ambulatory Visit: Payer: Medicare Other | Admitting: Family Medicine

## 2023-04-05 ENCOUNTER — Encounter: Payer: Self-pay | Admitting: Family Medicine

## 2023-04-05 VITALS — BP 112/78 | HR 60 | Temp 98.4°F

## 2023-04-05 DIAGNOSIS — N611 Abscess of the breast and nipple: Secondary | ICD-10-CM | POA: Diagnosis not present

## 2023-04-05 MED ORDER — DOXYCYCLINE HYCLATE 100 MG PO TABS
100.0000 mg | ORAL_TABLET | Freq: Two times a day (BID) | ORAL | 0 refills | Status: AC
Start: 2023-04-05 — End: 2023-04-15

## 2023-04-05 NOTE — Patient Instructions (Signed)
Doxycycline 7-10 days. Take with food and water. Do not lie down for at least 30 minutes after taking. Add a probiotic (at least 2 hours apart from antibiotic dose). Warm compresses 3-4 times per day Monitor for any worsening symptoms and follow-up if not making good progress

## 2023-04-05 NOTE — Progress Notes (Signed)
Acute Office Visit  Subjective:     Patient ID: Heidi Stark, female    DOB: [birth year], 82 y.o.   MRN: [id number]  Chief Complaint  Patient presents with   Skin Problem    HPI Patient is in today for skin concern.   Discussed the use of AI scribe software for clinical note transcription with the patient, who gave verbal consent to proceed.  Discussed the use of AI scribe software for clinical note transcription with the patient, who gave verbal consent to proceed.  History of Present Illness   The patient inflamed skin lesion to left breast tissue. She reports that the lesion initially appeared as a pus-filled bump like a pimple, which she attempted to drain, leading to bruising and increased inflammation. The patient has experienced similar lesions in the past, which were successfully treated with oral antibiotics. She has known allergies to Cipro and sulfa drugs but has previously tolerated doxycycline well.             ROS All review of systems negative except what is listed in the HPI      Objective:    BP 112/78   Pulse 60   Temp 98.4 F (36.9 C) (Oral)   SpO2 96%    Physical Exam Vitals reviewed.  Constitutional:      Appearance: Normal appearance.  Skin:    Comments: See picture of left breast abscess, already draining, with cellulitis; small area if induration centrally without fluctuance, surrounding erythema and warmth  Neurological:     Mental Status: She is alert and oriented to person, place, and time.  Psychiatric:        Mood and Affect: Mood normal.        Behavior: Behavior normal.        Thought Content: Thought content normal.        Judgment: Judgment normal.     No results found for any visits on 04/05/23.      Assessment & Plan:   Problem List Items Addressed This Visit   None Visit Diagnoses     Breast abscess    -  Primary   Relevant Medications   doxycycline (VIBRA-TABS) 100 MG tablet     Wound culture not  available in clinic today. Doxycycline 7-10 days. Take with food and water. Do not lie down for at least 30 minutes after taking. Add a probiotic (at least 2 hours apart from antibiotic dose). Warm compresses 3-4 times per day Monitor for any worsening symptoms and follow-up if not making good progress  Meds ordered this encounter  Medications   doxycycline (VIBRA-TABS) 100 MG tablet    Sig: Take 1 tablet (100 mg total) by mouth 2 (two) times daily for 10 days.    Dispense:  20 tablet    Refill:  0    Order Specific Question:   Supervising Provider    Answer:   Danise Edge A [[id number]]    Return if symptoms worsen or fail to improve.  Clayborne Dana, NP

## 2023-04-09 ENCOUNTER — Telehealth: Payer: Self-pay | Admitting: Family Medicine

## 2023-04-09 ENCOUNTER — Encounter (HOSPITAL_COMMUNITY): Payer: Medicare Other

## 2023-04-09 DIAGNOSIS — R110 Nausea: Secondary | ICD-10-CM

## 2023-04-09 MED ORDER — ONDANSETRON 8 MG PO TBDP
8.0000 mg | ORAL_TABLET | Freq: Three times a day (TID) | ORAL | 0 refills | Status: DC | PRN
Start: 2023-04-09 — End: 2023-11-09

## 2023-04-09 MED ORDER — CEPHALEXIN 500 MG PO CAPS: 500.0000 mg | ORAL_CAPSULE | Freq: Four times a day (QID) | ORAL | 0 refills | Status: DC

## 2023-04-09 NOTE — Telephone Encounter (Signed)
Note already in the chart about this today. Addressed this in other encounter.

## 2023-04-09 NOTE — Addendum Note (Signed)
Addended by: Hyman Hopes B on: 04/09/2023 12:51 PM   Modules accepted: Orders

## 2023-04-09 NOTE — Telephone Encounter (Signed)
Patient made aware.

## 2023-04-09 NOTE — Telephone Encounter (Signed)
I sent in some Zofran for the nausea. If this doesn't help, let me know and we can change the antibiotic, but let's try this first. Is the area looking any better since starting doxy?

## 2023-04-09 NOTE — Telephone Encounter (Signed)
Okay we can switch to keflex.  If area doesn't resolve after finishing this and continuing warm compresses, follow-up to be evaluated again.

## 2023-04-09 NOTE — Telephone Encounter (Signed)
Pt said that she was nauseated last night from the doxycycline and this morning she vomited. Pt thinks she may need a different antibiotic to be sent in. Please call to advise.

## 2023-04-09 NOTE — Addendum Note (Signed)
Addended by: Clayborne Dana on: 04/09/2023 03:23 PM   Modules accepted: Orders

## 2023-04-09 NOTE — Telephone Encounter (Signed)
Spoke with patient. She has been on medication since Thursday, but just started with nausea today. I let her know what Ladona Ridgel stated and she is "not comfortable continuing medication" she would like to change to a different antibiotic. She states the area looks better, but still not completely better. Please advise.

## 2023-04-09 NOTE — Telephone Encounter (Signed)
Pt called stating that the last two times she has taken the doxycycline, it has made her vomit. Pt would like to know if there is anything else that can be substituted.

## 2023-04-19 ENCOUNTER — Ambulatory Visit (HOSPITAL_COMMUNITY)
Admission: RE | Admit: 2023-04-19 | Discharge: 2023-04-19 | Disposition: A | Discharge status: Home / Self Care | Payer: Medicare Other | Source: Ambulatory Visit | Attending: Cardiology | Admitting: Cardiology

## 2023-04-19 ENCOUNTER — Encounter (HOSPITAL_COMMUNITY): Payer: Self-pay

## 2023-04-19 VITALS — BP 102/60 | HR 55 | Wt 252.0 lb

## 2023-04-19 DIAGNOSIS — I5042 Chronic combined systolic (congestive) and diastolic (congestive) heart failure: Secondary | ICD-10-CM | POA: Diagnosis present

## 2023-04-19 DIAGNOSIS — I472 Ventricular tachycardia, unspecified: Secondary | ICD-10-CM | POA: Diagnosis not present

## 2023-04-19 DIAGNOSIS — J449 Chronic obstructive pulmonary disease, unspecified: Secondary | ICD-10-CM | POA: Diagnosis not present

## 2023-04-19 DIAGNOSIS — N1832 Chronic kidney disease, stage 3b: Secondary | ICD-10-CM | POA: Insufficient documentation

## 2023-04-19 DIAGNOSIS — I130 Hypertensive heart and chronic kidney disease with heart failure and stage 1 through stage 4 chronic kidney disease, or unspecified chronic kidney disease: Secondary | ICD-10-CM | POA: Insufficient documentation

## 2023-04-19 DIAGNOSIS — E039 Hypothyroidism, unspecified: Secondary | ICD-10-CM | POA: Insufficient documentation

## 2023-04-19 DIAGNOSIS — I2510 Atherosclerotic heart disease of native coronary artery without angina pectoris: Secondary | ICD-10-CM | POA: Insufficient documentation

## 2023-04-19 DIAGNOSIS — Z79890 Hormone replacement therapy (postmenopausal): Secondary | ICD-10-CM | POA: Insufficient documentation

## 2023-04-19 DIAGNOSIS — I4892 Unspecified atrial flutter: Secondary | ICD-10-CM | POA: Diagnosis not present

## 2023-04-19 DIAGNOSIS — I480 Paroxysmal atrial fibrillation: Secondary | ICD-10-CM | POA: Insufficient documentation

## 2023-04-19 DIAGNOSIS — I5022 Chronic systolic (congestive) heart failure: Secondary | ICD-10-CM | POA: Diagnosis not present

## 2023-04-19 DIAGNOSIS — Z7901 Long term (current) use of anticoagulants: Secondary | ICD-10-CM | POA: Diagnosis not present

## 2023-04-19 DIAGNOSIS — Z87891 Personal history of nicotine dependence: Secondary | ICD-10-CM | POA: Insufficient documentation

## 2023-04-19 LAB — COMPREHENSIVE METABOLIC PANEL
ALT: 25 U/L (ref 0–44)
AST: 39 U/L (ref 15–41)
Albumin: 3.3 g/dL — ABNORMAL LOW (ref 3.5–5.0)
Alkaline Phosphatase: 44 U/L (ref 38–126)
Anion gap: 15 (ref 5–15)
BUN: 15 mg/dL (ref 8–23)
CO2: 25 mmol/L (ref 22–32)
Calcium: 8.4 mg/dL — ABNORMAL LOW (ref 8.9–10.3)
Chloride: 95 mmol/L — ABNORMAL LOW (ref 98–111)
Creatinine, Ser: 1.41 mg/dL — ABNORMAL HIGH (ref 0.44–1.00)
GFR, Estimated: 37 mL/min — ABNORMAL LOW (ref >60)
Glucose, Bld: 126 mg/dL — ABNORMAL HIGH (ref 70–99)
Potassium: 4 mmol/L (ref 3.5–5.1)
Sodium: 135 mmol/L (ref 135–145)
Total Bilirubin: 0.6 mg/dL (ref 0.3–1.2)
Total Protein: 6.9 g/dL (ref 6.5–8.1)

## 2023-04-19 LAB — LIPID PANEL
Cholesterol: 159 mg/dL (ref 0–200)
HDL: 49 mg/dL (ref >40)
LDL Cholesterol: 72 mg/dL (ref 0–99)
Total CHOL/HDL Ratio: 3.2 {ratio}
Triglycerides: 190 mg/dL — ABNORMAL HIGH (ref <150)
VLDL: 38 mg/dL (ref 0–40)

## 2023-04-19 LAB — CBC
HCT: 48.1 % — ABNORMAL HIGH (ref 36.0–46.0)
Hemoglobin: 15.7 g/dL — ABNORMAL HIGH (ref 12.0–15.0)
MCH: 29.9 pg (ref 26.0–34.0)
MCHC: 32.6 g/dL (ref 30.0–36.0)
MCV: 91.6 fL (ref 80.0–100.0)
Platelets: 168 10*3/uL (ref 150–400)
RBC: 5.25 MIL/uL — ABNORMAL HIGH (ref 3.87–5.11)
RDW: 15 % (ref 11.5–15.5)
WBC: 10.7 10*3/uL — ABNORMAL HIGH (ref 4.0–10.5)
nRBC: 0 % (ref 0.0–0.2)

## 2023-04-19 NOTE — Patient Instructions (Signed)
Medication Changes:  No Changes In Medications at this time.   Lab Work:  Labs done today, your results will be available in MyChart, we will contact you for abnormal readings.  Follow-Up in: 4 months with Dr. Shirlee Latch PLEASE CALL OUR OFFICE AROUND NOVEMEBR TO GET SCHEDULED FOR YOUR APPOINTMENT. PHONE NUMBER IS [phone] OPTION 2    At the [facility], you and your health needs are our priority. We have a designated team specialized in the treatment of Heart Failure. This Care Team includes your primary Heart Failure Specialized Cardiologist (physician), Advanced Practice Providers (APPs- Physician Assistants and Nurse Practitioners), and Pharmacist who all work together to provide you with the care you need, when you need it.   You may see any of the following providers on your designated Care Team at your next follow up:  Dr. Arvilla Meres Dr. Marca Ancona Dr. Dorthula Nettles Dr. Theresia Bough Tonye Becket, NP Robbie Lis, [place] [facility] [place], [place] Brynda Peon, NP [place] Lee, NP Karle Plumber, PharmD   Please be sure to bring in all your medications bottles to every appointment.   Need to Contact us:  If you have any questions or concerns before your next appointment please send [name] a message through [place] or call our office at [phone].    TO LEAVE A MESSAGE FOR THE NURSE SELECT OPTION 2, PLEASE LEAVE A MESSAGE INCLUDING: YOUR NAME DATE OF BIRTH CALL BACK NUMBER REASON FOR CALL**this is important as we prioritize the call backs  YOU WILL RECEIVE A CALL BACK THE SAME DAY AS LONG AS YOU CALL BEFORE 4:00 PM

## 2023-04-19 NOTE — Progress Notes (Signed)
Advanced Heart Failure Clinic Note   PCP:  Donato Schultz, DO  HF Cardiologist: Dr. Shirlee Latch  History of Present Illness: Heidi Stark is a 82 y.o. female who has history of HTN, DM, hyperlipidemia.  She was admitted in 4/15 with splenic and renal infarcts.  She was found to have paroxysmal atrial fibrillation.  In 4/15, she had fever and flank pain.  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  TEE showed prominent aortic plaque.  While she was in the hospital, she was noted to have a brief run of atrial fibrillation and was started on coumadin.     She has left leg pain with ambulation after about 100 feet, LE dopplers 5/15 showed occluded left external iliac artery. This has been present ever since she had left iliac damage with a prior back surgery.     She was stable until 1/18, when she developed palpitations and exertional dyspnea.  She was admitted to [facility] with atrial fibrillation/RVR and acute systolic CHF.  EF was found to be 30-35%, down from 50-55% in 2015.  She was diuresed and underwent DCCV back to NSR.  She subsequently had atrial fibrillation ablation in 4/18.  She went back into atrial fibrillation in 5/19 and had DCCV back today NSR.    Echo was done 6/19 and reviewed, EF 55%, mild LVH, moderate diastolic dysfunction, normal RV size and systolic function.    With recurrent atrial fibrillation, she was admitted in 10/19 to start Tikosyn.  She was cardioverted back to NSR.   She had a recent admission in 2/22 with lightheadedness/presyncope that appeared to correlate with runs of VT.  She was on Tikosyn for atrial fibrillation though VT not thought to be due to Tikosyn (not polymorphic, QT not prolonged).  Tikosyn was stopped and amiodarone was started.  Echo in 2/22 showed EF 50-55% but cMRI (2/22) showed EF 40% with subendocardial scar in the basal to mid inferolateral wall, RV EF was 38%.  She had coronary angiography showing  no obstructive disease.  VT subsided and she was sent home on amiodarone.    09/13/20, she had 3 episodes of presyncope in succession while sitting at a desk.  No prodrome, each episode of severe lightheadedness lasted maybe 30 seconds.  She did not pass out. Because of these events, she came to the ER. She was admitted for observation and amiodarone gtt was restarted.  She had no VT overnight.  She remained in atypical atrial flutter. (was in atypical flutter at last admission). She had a single chamber Medtronic ICD placed 09/15/20. Discharge weight 256 lbs.   She had post hospital f/u on 09/22/20 and was noted to be back in atrial flutter w/ CVR. She was set up for repeat DCCV 3 weeks later. S/p DCCV on 10/11/20 and converted back to NSR.   She had COVID-19 in 9/22.   Follow up 09/01/21, she was volume overloaded and instructed to take metolazone 2.5 + extra 40 KCL x 2 days.  Multiple episodes of VT on 09/06/21, received ATP but no shocks. Follow up with EP 09/08/21 and started on mexiletine 250 bid.  On 11/28/21, she had VT with ATP and 1 ICD shock.  She had not taken amiodarone or mexiletine for a couple of days when this occurred.  She saw Dr. Elberta Fortis, meds were not changed.   Echo in 8/23 showed EF 60-65%, moderate LVH, normal RV, normal IVC.  She was noted to be in atrial fibrillation in 4/24, back in NSR when she came in for DCCV. Converted to NSR.   She presents today for routine f/u. Here w/ her daughter. Sadden by the loss of her husband. He passed away 2 weeks ago from cancer.   She has been doing ok from cardiac standpoint. Denies resting dyspnea. C/w stable NYHA Class III symptoms, confounded by age and deconditioning. Euvolemic on exam and by device interrogation. Wt down 8 lb since June. Fluid index < threshold. No recent AF/AT. No VT. Seen by Dr. Gerre Pebbles on 9/17 and EKG showed normal SR. BP today 102/60 but no orthostatic symptoms. Denies CP.   Of note, she said she had f/u w/ her  endocrinologist recently had had thyroid checked. I reviewed results in care everywhere, TSH was elevated at 14.70. I do not see that Free T3/T4 was checked.   EKG: not performed   Medtronic device interrogation: No AF, no VT. Fluid index < threshold  Labs (5/19): K 3.9, creatinine 0.78 Labs (6/19): K 4.4, creatinine 0.88 Labs (12/19): K 4.2, creatinine 0.85, TGs 322, LDL 77 Labs (2/20): LDL 46, TGs 362 Labs (3/20): K 4.7, creatinine 1.07 Labs (11/20): TGs 282, LDL 92 Labs (3/22): K 4.1, creatinine 1.23 Labs (4/22): K 4.2, creatinine 1.2, LFTs normal, TSH mildly elevated, free T4 mildly elevated, normal T3.  Labs (6/22): TSH mildly elevated, free T3 and T4 normal Labs (7/22): TGs 292, LDL 94 Labs (10/22): K 3.8, creatinine 1.04, BNP 62, hgb 14.1, LFTs normal Labs (2/23): K 3.5, creatinine 1.75, mag 2.5 Labs (3/23): TSH 8.31 Labs (5/23): BNP 542, K 3.8, creatinine 1.7 Labs (8/23): hgb 14.2, K 4, creatinine 1.66 Labs (11/23): normal TSH Labs (12/23): K 3.6, creatinine 1.35, normal LFTs Labs (4/24): K 4.2, creatinine 1.54, TSH low at 0.086, free T4 elevated (Synthroid stopped) Labs (10/24); TSH elevated 14.7  PMH: 1.Type II diabetes 2. HTN 3. Hyperlipidemia 4. COPD: has quit smoking.  5. CAD: Coronary CTA (4/18) with nonobstructive coronary disease, calcium score 335 Agatston units (82nd percentile).  - Coronary angiography (2/22) with no obstructive disease 6. Paroxysmal atrial fibrillation: First noted in 4/15.  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   - Event monitor (4/15) with no atrial fibrillation.  - Admission with atrial fibrillation/RVR in 1/18.  - Atrial fibrillation ablation in 4/18.  - DCCV in 5/19.  - Tikosyn started + DCCV in 10/19. VT => Tikosyn stopped and amiodarone begun in 3/22.   - S/p single chamber MDT ICD 09/15/20 - DCCV to NSR in 3/22.  7. Chronic systolic CHF: ?tachycardia-mediated as first noted in setting of  atrial fibrillation with RVR.  - TEE (4/15) with EF 50-55%, mild LVH, grade III-IV plaque in descending thoracic aorta, RV normal, no PFO, peak RV-RA gradient 42 mmHg.   - Echo (1/18) with EF 30-35%, diffuse hypokinesis, moderate LVH, mildly dilated RV, PASP 55 mmHg.  - Echo (4/18) with EF 50%, anterolateral/inferolateral mild hypokinesis, mild aortic stenosis, IVC normal.  - Echo (6/19) with EF 55%, mild LVH, moderate diastolic dysfunction, normal RV size and systolic function.  - cMRI (2/22) with EF 40% with subendocardial scar in the basal to mid inferolateral wall, RV EF was 38%. - Echo (2/22) with EF 50-55%, normal RV.  - Echo (8/23): EF 60-65%, moderate LVH, normal RV, normal IVC.  7. H/o diskectomy. 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 Lower extremity arterial dopplers (5/15)  with occluded left external iliac artery.  - ABIs (5/18): 1.16 (normal) on right, 0.56 on left.  - CTA (5/24): Right anterior tibial and peroneal arteries occluded, left CIA/EIA/internal iliac artery occluded with reconstitution of SFA.  9. Aortic stenosis: Mild on 4/18 echo.  10. OSA: Uses CPAP 11. VT: 2/22.  No significant CAD on cath in 2/22.  cMRI with EF 40% with subendocardial scar in the basal to mid inferolateral wall, RV EF was 38%. - Medtronic ICD placed 3/22.  12. COVID-19 9/22 13. Hypothyroidism => hyperthyroidism  Current Outpatient Medications  Medication Sig Dispense Refill   acetaminophen (TYLENOL) 500 MG tablet Take 325 mg by mouth every 6 (six) hours as needed for moderate pain or headache.     amiodarone (PACERONE) 200 MG tablet Take 200 mg by mouth daily.     apixaban (ELIQUIS) 5 MG TABS tablet Take 1 tablet (5 mg total) by mouth 2 (two) times daily. 60 tablet 11   BD PEN NEEDLE NANO 2ND GEN 32G X 4 MM MISC See admin instructions.     dapagliflozin propanediol (FARXIGA) 10 MG TABS tablet TAKE 1 TABLET BY MOUTH EVERY DAY BEFORE BREAKFAST 90 tablet 3   ENTRESTO 24-26 MG  TAKE 1 TABLET BY MOUTH TWICE A DAY 180 tablet 3   eplerenone (INSPRA) 25 MG tablet TAKE 1 TABLET BY MOUTH EVERY DAY 90 tablet 3   fenofibrate (TRICOR) 145 MG tablet TAKE 1 TABLET BY MOUTH EVERY DAY 90 tablet 3   furosemide (LASIX) 40 MG tablet Take 80 mg by mouth 2 (two) times daily.     gabapentin (NEURONTIN) 300 MG capsule Take 300 mg by mouth daily as needed (pain).     HUMALOG MIX 75/25 KWIKPEN (75-25) 100 UNIT/ML KwikPen Take 40 units with morning meal and 30 units with evening meal 90 mL 2   icosapent Ethyl (VASCEPA) 1 g capsule TAKE 2 CAPSULES BY MOUTH TWICE A DAY 120 capsule 5   KLOR-CON M10 10 MEQ tablet TAKE 2 TABLETS BY MOUTH TWICE A DAY 360 tablet 3   LIVALO 4 MG TABS TAKE 1 TABLET BY MOUTH EVERY DAY 90 tablet 3   LORazepam (ATIVAN) 0.5 MG tablet Take 1 tablet (0.5 mg total) by mouth every 8 (eight) hours. 30 tablet 0   metoprolol succinate (TOPROL-XL) 25 MG 24 hr tablet Take 0.5 tablets (12.5 mg total) by mouth daily. 45 tablet 3   mexiletine (MEXITIL) 150 MG capsule Take 2 capsules (300 mg total) by mouth 2 (two) times daily. 360 capsule 3   Multiple Vitamin (MULTIVITAMIN WITH MINERALS) TABS tablet Take 1 tablet by mouth daily.     ondansetron (ZOFRAN-ODT) 8 MG disintegrating tablet Take 1 tablet (8 mg total) by mouth every 8 (eight) hours as needed for nausea or vomiting. 20 tablet 0   ONETOUCH VERIO test strip 1 each 3 (three) times daily.     rOPINIRole (REQUIP) 0.25 MG tablet Take 0.25 mg by mouth as needed.     No current facility-administered medications for this encounter.    Allergies:   Neomycin-bacitracin zn-polymyx, Sulfonamide derivatives, Sulfa antibiotics, Ciprofloxacin, and Spironolactone   Social History:  The patient  reports that she quit smoking about 2 years ago. Her smoking use included cigarettes. She started smoking about 52 years ago. She has a 50 pack-year smoking history. She has never used smokeless tobacco. She reports that she does not drink alcohol  and does not use drugs.   Family History:  The patient's family history  includes Cancer in her mother; Diabetes in her father, mother, and another family member; Factor V Leiden deficiency in her daughter; Hypertension in her mother; Melanoma in an other family member; Obesity in her father and mother.   ROS:  Please see the history of present illness.   All other systems are personally reviewed and negative.   BP 102/60   Pulse (!) 55   Wt 114.3 kg (252 lb)   SpO2 95%   BMI 40.67 kg/m   Wt Readings from Last 3 Encounters:  04/19/23 114.3 kg (252 lb)  04/03/23 117.9 kg (260 lb)  12/28/22 117.9 kg (260 lb)   PHYSICAL EXAM: General:  Well appearing elderly F in WC. No respiratory difficulty HEENT: normal Neck: supple. no JVD. Carotids 2+ bilat; no bruits. No lymphadenopathy or thyromegaly appreciated. Cor: PMI nondisplaced. Regular rate & rhythm. No rubs, gallops or murmurs. Lungs: clear Abdomen: soft, nontender, nondistended. No hepatosplenomegaly. No bruits or masses. Good bowel sounds. Extremities: no cyanosis, clubbing, rash, edema Neuro: alert & oriented x 3, cranial nerves grossly intact. moves all 4 extremities w/o difficulty. Affect pleasant.  Recent Labs: 09/25/2022: B Natriuretic Peptide 91.8 12/06/2022: ALT 18; BUN 21; Creatinine, Ser 1.49; Hemoglobin 15.1; Platelets 159; Potassium 4.0; Sodium 136; TSH 0.865  Personally reviewed   ASSESSMENT AND PLAN: 1. H/o VT: noted 08/2020. 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 Had been on Tikosyn but this was not the typical Tikosyn-induced polymorphic VT and she had had no changes in Tikosyn dosing prior to occurrence. cMRI showed subendocardial LGE in the basal to mid inferolateral wall. This appeared to be a coronary disease pattern however coronary angiogram did not show significant coronary disease. She was transitioned from Tikosyn to amiodarone and got Medtronic single chamber ICD on 09/15/20. VT on 09/06/21 and was  started on mexiletine.  VT again on 11/28/21 after missing amiodarone and mexiletine. No VT since then on device interrogation, personally reviewed.  - Continue amiodarone 200 mg daily. Recent TSH elevated at 14.7. will check Free T3/T4. Check HFTs.  She will need a regular eye exam.  - Continue mexiletine 300 mg bid. 2. Atrial fibrillation/flutter: Paroxysmal.  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 She was admitted in 1/18 with atrial fibrillation/RVR and CHF.  She was diuresed and had DCCV back to NSR. She had atrial fibrillation ablation in 4/18.  In 5/19, she went into atrial fibrillation transiently.  In 10/19, she was back in atrial fibrillation and was admitted for Tikosyn initiation and cardioversion.  Atypical atrial flutter noted in 12/21 with DCCV to NSR. As outlined above, now off Tikosyn and on amiodarone. Underwent repeat DCCV in 3/22. NSR on EKG 9/17. Device interrogation shows no further AT/AF episodes since 9/24 - Continue amiodarone. - Continue Eliquis 5 mg bid. Denies abnormal bleeding. Check CBC today  3. Claudication: Left leg claudication with absent left PT pulse.  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  CTA in 5/24 showed right anterior tibial and peroneal arteries occluded, left CIA/EIA/internal iliac artery occluded with reconstitution of SFA.  - Followup with [facility] (Dr. Randie Heinz).  4. Chronic systolic => diastolic CHF: EF 16-10% on echo in 1/18, down from 50-55% in 4/15.  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 In 2/22, cMRI showed LV EF 40% with RV E 38%, inferolateral subendocardial  LGE, but echo in 2/22 showed EF 50-55%.  Echo in 8/23 showed EF 60-65%, moderate LVH, normal RV, normal IVC.  NYHA Class III symptoms, confounded by age and deconditioning. Euvolemic on exam and by device  interrogation. Wt down 8 lb since June. Fluid index < threshold. No recent AF/AT. No VT.  - Continue Entresto 24/26 mg bid.  BP too soft for dose increase - Continue Lasix 80 mg bid. - Continue Toprol XL 12.5 mg daily.   - Continue eplerenone 25 mg daily.  - Continue dapagliflozin 10 mg daily.  - Check BMP today  5. CAD: Coronary CTA in 2018 showed coronary calcium score in the 82nd percentile with nonobstructive coronary disease. Cath in 2/22 showed only luminal irregularities. She denies anginal symptoms.  - Not on ASA due to Eliquis.  - Continue Vascepa and Livalo. Check lipid panel and HFTs today  6. COPD:  History of smoking. Quit 2/22.  7. OSA: Continue CPAP.  8. Obesity: Body mass index is 40.67 kg/m. - Unable to tolerate semaglutide due to nausea. - Will refer back to pharmacy clinic to see if she can get [facility].  9. CKD Stage IIIb: Baseline SCr ~1.4-1.6.  - BMET today. 10. Thyroid: H/o hypothyroidism on Synthroid.  Most recently, TSH suppressed with high free T4. ?Development of hyperthyroidism related to amiodarone. Synthroid stopped. Seen recently by Dr. Talmage Nap for endocrinology. TSH checked 10/1 and was elevated at 14.70 - will obtain f/u Free T3/T4   Follow up in 4 months with Dr. Shirlee Latch   Signed, Robbie Lis, PA-C  04/19/2023  [facility] [address] [facility] [place] [address] [phone] (office) [phone] (fax)

## 2023-04-20 ENCOUNTER — Telehealth: Payer: Self-pay | Admitting: Family Medicine

## 2023-04-20 ENCOUNTER — Telehealth (HOSPITAL_COMMUNITY): Payer: Self-pay

## 2023-04-20 DIAGNOSIS — N611 Abscess of the breast and nipple: Secondary | ICD-10-CM

## 2023-04-20 LAB — T4: T4, Total: 11.5 ug/dL (ref 4.5–12.0)

## 2023-04-20 LAB — T3: T3, Total: 84 ng/dL (ref 71–180)

## 2023-04-20 NOTE — Telephone Encounter (Signed)
-----   Message from [place] sent at 04/19/2023  5:21 PM EDT ----- Labs stable except WBC is mildly elevated. Check w/ patient to see if any fever, chills, cough or dysuria. If she has any symptoms concerning for infection, then she should f/u w/ her PCP.

## 2023-04-20 NOTE — Telephone Encounter (Signed)
Spoke with patient regarding the following results. Patient made aware and patient verbalized understanding.   Patient states that she has no symptoms of infection at this time. Advised her  if anything to arise she should follow up with PCP. Patient verbalized understanding.   Will forward to make provider aware.

## 2023-04-20 NOTE — Telephone Encounter (Signed)
Luster Landsberg (daughter [facility] [place]) called stating that pt would like to request a second set of antibiotic to treat the sore that she was dealing with. She stated that there had been some improvement with the sore with this medication but feels it might need an extra set to finish it off. Advised a note would be sent back to look into this for her.

## 2023-04-22 ENCOUNTER — Other Ambulatory Visit: Payer: Self-pay | Admitting: Cardiology

## 2023-04-23 ENCOUNTER — Other Ambulatory Visit (HOSPITAL_COMMUNITY): Payer: Self-pay

## 2023-04-23 MED ORDER — CEPHALEXIN 500 MG PO CAPS
500.0000 mg | ORAL_CAPSULE | Freq: Three times a day (TID) | ORAL | 0 refills | Status: AC
Start: 2023-04-23 — End: 2023-04-28

## 2023-04-23 NOTE — Telephone Encounter (Signed)
Calleds pts daughter Luster Landsberg back to inform her pts rx has been sent in and it has been advised that pt call back to schedule a follow up appointment after finishing antibiotics. Advised Renee to call the office back with any further questions or concerns.

## 2023-04-23 NOTE — Telephone Encounter (Signed)
Spoke to patient, reports she can not tolerate Ozempic- severe nauseas and vomiting. And per test claim Greggory Keen would be covered without PA but the co-pay would be around $260 per month. Patient reports she lost her husband and her income lowered significantly so Greggory Keen is cost prohibitive for her. And [employer] - patient assistance program is not available for [facility].

## 2023-04-23 NOTE — Addendum Note (Signed)
Addended by: Hyman Hopes B on: 04/23/2023 10:10 AM   Modules accepted: Orders

## 2023-04-25 ENCOUNTER — Encounter (HOSPITAL_COMMUNITY): Payer: Self-pay | Admitting: Cardiology

## 2023-04-26 MED ORDER — FUROSEMIDE 40 MG PO TABS: 80.0000 mg | ORAL_TABLET | Freq: Two times a day (BID) | ORAL | 6 refills | Status: DC

## 2023-05-01 ENCOUNTER — Other Ambulatory Visit: Payer: Self-pay | Admitting: Cardiology

## 2023-05-09 ENCOUNTER — Other Ambulatory Visit: Payer: Self-pay | Admitting: Cardiology

## 2023-05-12 ENCOUNTER — Emergency Department (HOSPITAL_COMMUNITY): Payer: Medicare Other

## 2023-05-12 ENCOUNTER — Other Ambulatory Visit: Payer: Self-pay

## 2023-05-12 ENCOUNTER — Inpatient Hospital Stay (HOSPITAL_COMMUNITY)
Admission: EM | Admit: 2023-05-12 | Discharge: 2023-05-19 | DRG: 871 | Disposition: A | Discharge status: Home Health | Payer: Medicare Other | Attending: Internal Medicine | Admitting: Internal Medicine

## 2023-05-12 DIAGNOSIS — Z961 Presence of intraocular lens: Secondary | ICD-10-CM | POA: Diagnosis present

## 2023-05-12 DIAGNOSIS — E66813 Obesity, class 3: Secondary | ICD-10-CM | POA: Diagnosis present

## 2023-05-12 DIAGNOSIS — Z79890 Hormone replacement therapy (postmenopausal): Secondary | ICD-10-CM

## 2023-05-12 DIAGNOSIS — Z8249 Family history of ischemic heart disease and other diseases of the circulatory system: Secondary | ICD-10-CM

## 2023-05-12 DIAGNOSIS — Z832 Family history of diseases of the blood and blood-forming organs and certain disorders involving the immune mechanism: Secondary | ICD-10-CM

## 2023-05-12 DIAGNOSIS — E872 Acidosis, unspecified: Secondary | ICD-10-CM | POA: Diagnosis present

## 2023-05-12 DIAGNOSIS — E785 Hyperlipidemia, unspecified: Secondary | ICD-10-CM | POA: Diagnosis present

## 2023-05-12 DIAGNOSIS — Z9842 Cataract extraction status, left eye: Secondary | ICD-10-CM

## 2023-05-12 DIAGNOSIS — Z66 Do not resuscitate: Secondary | ICD-10-CM | POA: Diagnosis present

## 2023-05-12 DIAGNOSIS — I5033 Acute on chronic diastolic (congestive) heart failure: Secondary | ICD-10-CM | POA: Insufficient documentation

## 2023-05-12 DIAGNOSIS — I4892 Unspecified atrial flutter: Secondary | ICD-10-CM | POA: Diagnosis present

## 2023-05-12 DIAGNOSIS — G8929 Other chronic pain: Secondary | ICD-10-CM | POA: Diagnosis present

## 2023-05-12 DIAGNOSIS — I10 Essential (primary) hypertension: Secondary | ICD-10-CM | POA: Diagnosis present

## 2023-05-12 DIAGNOSIS — Z86718 Personal history of other venous thrombosis and embolism: Secondary | ICD-10-CM

## 2023-05-12 DIAGNOSIS — N179 Acute kidney failure, unspecified: Secondary | ICD-10-CM | POA: Diagnosis present

## 2023-05-12 DIAGNOSIS — Z1152 Encounter for screening for COVID-19: Secondary | ICD-10-CM

## 2023-05-12 DIAGNOSIS — S90522A Blister (nonthermal), left ankle, initial encounter: Secondary | ICD-10-CM | POA: Diagnosis present

## 2023-05-12 DIAGNOSIS — N183 Chronic kidney disease, stage 3 unspecified: Secondary | ICD-10-CM | POA: Diagnosis present

## 2023-05-12 DIAGNOSIS — E1122 Type 2 diabetes mellitus with diabetic chronic kidney disease: Secondary | ICD-10-CM | POA: Diagnosis present

## 2023-05-12 DIAGNOSIS — Z833 Family history of diabetes mellitus: Secondary | ICD-10-CM

## 2023-05-12 DIAGNOSIS — Z881 Allergy status to other antibiotic agents status: Secondary | ICD-10-CM

## 2023-05-12 DIAGNOSIS — Z79899 Other long term (current) drug therapy: Secondary | ICD-10-CM

## 2023-05-12 DIAGNOSIS — E1165 Type 2 diabetes mellitus with hyperglycemia: Secondary | ICD-10-CM | POA: Diagnosis present

## 2023-05-12 DIAGNOSIS — I130 Hypertensive heart and chronic kidney disease with heart failure and stage 1 through stage 4 chronic kidney disease, or unspecified chronic kidney disease: Secondary | ICD-10-CM | POA: Diagnosis present

## 2023-05-12 DIAGNOSIS — E669 Obesity, unspecified: Secondary | ICD-10-CM | POA: Diagnosis present

## 2023-05-12 DIAGNOSIS — E876 Hypokalemia: Secondary | ICD-10-CM | POA: Diagnosis present

## 2023-05-12 DIAGNOSIS — Z9841 Cataract extraction status, right eye: Secondary | ICD-10-CM

## 2023-05-12 DIAGNOSIS — D3502 Benign neoplasm of left adrenal gland: Secondary | ICD-10-CM | POA: Diagnosis present

## 2023-05-12 DIAGNOSIS — M549 Dorsalgia, unspecified: Secondary | ICD-10-CM

## 2023-05-12 DIAGNOSIS — I2510 Atherosclerotic heart disease of native coronary artery without angina pectoris: Secondary | ICD-10-CM | POA: Diagnosis present

## 2023-05-12 DIAGNOSIS — L03116 Cellulitis of left lower limb: Secondary | ICD-10-CM | POA: Diagnosis present

## 2023-05-12 DIAGNOSIS — I472 Ventricular tachycardia, unspecified: Secondary | ICD-10-CM | POA: Diagnosis present

## 2023-05-12 DIAGNOSIS — M47816 Spondylosis without myelopathy or radiculopathy, lumbar region: Secondary | ICD-10-CM | POA: Diagnosis present

## 2023-05-12 DIAGNOSIS — N1832 Chronic kidney disease, stage 3b: Secondary | ICD-10-CM | POA: Diagnosis present

## 2023-05-12 DIAGNOSIS — I959 Hypotension, unspecified: Secondary | ICD-10-CM

## 2023-05-12 DIAGNOSIS — E1169 Type 2 diabetes mellitus with other specified complication: Secondary | ICD-10-CM

## 2023-05-12 DIAGNOSIS — Z87891 Personal history of nicotine dependence: Secondary | ICD-10-CM

## 2023-05-12 DIAGNOSIS — E039 Hypothyroidism, unspecified: Secondary | ICD-10-CM

## 2023-05-12 DIAGNOSIS — Z95810 Presence of automatic (implantable) cardiac defibrillator: Secondary | ICD-10-CM

## 2023-05-12 DIAGNOSIS — R6520 Severe sepsis without septic shock: Secondary | ICD-10-CM | POA: Diagnosis present

## 2023-05-12 DIAGNOSIS — I428 Other cardiomyopathies: Secondary | ICD-10-CM | POA: Diagnosis present

## 2023-05-12 DIAGNOSIS — A419 Sepsis, unspecified organism: Principal | ICD-10-CM | POA: Diagnosis present

## 2023-05-12 DIAGNOSIS — F439 Reaction to severe stress, unspecified: Secondary | ICD-10-CM

## 2023-05-12 DIAGNOSIS — I4510 Unspecified right bundle-branch block: Secondary | ICD-10-CM | POA: Diagnosis present

## 2023-05-12 DIAGNOSIS — I700 Atherosclerosis of aorta: Secondary | ICD-10-CM | POA: Diagnosis present

## 2023-05-12 DIAGNOSIS — Z882 Allergy status to sulfonamides status: Secondary | ICD-10-CM

## 2023-05-12 DIAGNOSIS — Z888 Allergy status to other drugs, medicaments and biological substances status: Secondary | ICD-10-CM

## 2023-05-12 DIAGNOSIS — E871 Hypo-osmolality and hyponatremia: Secondary | ICD-10-CM | POA: Diagnosis present

## 2023-05-12 DIAGNOSIS — Z794 Long term (current) use of insulin: Secondary | ICD-10-CM

## 2023-05-12 DIAGNOSIS — Z87442 Personal history of urinary calculi: Secondary | ICD-10-CM

## 2023-05-12 DIAGNOSIS — M4807 Spinal stenosis, lumbosacral region: Secondary | ICD-10-CM | POA: Diagnosis present

## 2023-05-12 DIAGNOSIS — Z7901 Long term (current) use of anticoagulants: Secondary | ICD-10-CM

## 2023-05-12 DIAGNOSIS — I480 Paroxysmal atrial fibrillation: Secondary | ICD-10-CM

## 2023-05-12 DIAGNOSIS — Z6841 Body Mass Index (BMI) 40.0 and over, adult: Secondary | ICD-10-CM

## 2023-05-12 DIAGNOSIS — M4856XS Collapsed vertebra, not elsewhere classified, lumbar region, sequela of fracture: Secondary | ICD-10-CM

## 2023-05-12 DIAGNOSIS — D72829 Elevated white blood cell count, unspecified: Principal | ICD-10-CM

## 2023-05-12 DIAGNOSIS — G4733 Obstructive sleep apnea (adult) (pediatric): Secondary | ICD-10-CM | POA: Diagnosis present

## 2023-05-12 DIAGNOSIS — I4819 Other persistent atrial fibrillation: Secondary | ICD-10-CM | POA: Diagnosis present

## 2023-05-12 DIAGNOSIS — J449 Chronic obstructive pulmonary disease, unspecified: Secondary | ICD-10-CM | POA: Diagnosis present

## 2023-05-12 DIAGNOSIS — Z808 Family history of malignant neoplasm of other organs or systems: Secondary | ICD-10-CM

## 2023-05-12 LAB — BASIC METABOLIC PANEL
Anion gap: 15 (ref 5–15)
BUN: 17 mg/dL (ref 8–23)
CO2: 23 mmol/L (ref 22–32)
Calcium: 8.4 mg/dL — ABNORMAL LOW (ref 8.9–10.3)
Chloride: 98 mmol/L (ref 98–111)
Creatinine, Ser: 1.67 mg/dL — ABNORMAL HIGH (ref 0.44–1.00)
GFR, Estimated: 31 mL/min — ABNORMAL LOW (ref >60)
Glucose, Bld: 220 mg/dL — ABNORMAL HIGH (ref 70–99)
Potassium: 3.4 mmol/L — ABNORMAL LOW (ref 3.5–5.1)
Sodium: 136 mmol/L (ref 135–145)

## 2023-05-12 LAB — CBC
HCT: 50.3 % — ABNORMAL HIGH (ref 36.0–46.0)
Hemoglobin: 16.2 g/dL — ABNORMAL HIGH (ref 12.0–15.0)
MCH: 30.2 pg (ref 26.0–34.0)
MCHC: 32.2 g/dL (ref 30.0–36.0)
MCV: 93.8 fL (ref 80.0–100.0)
Platelets: 178 10*3/uL (ref 150–400)
RBC: 5.36 MIL/uL — ABNORMAL HIGH (ref 3.87–5.11)
RDW: 15.6 % — ABNORMAL HIGH (ref 11.5–15.5)
WBC: 22.2 10*3/uL — ABNORMAL HIGH (ref 4.0–10.5)
nRBC: 0 % (ref 0.0–0.2)

## 2023-05-12 LAB — URINALYSIS, ROUTINE W REFLEX MICROSCOPIC
Bacteria, UA: NONE SEEN
Bilirubin Urine: NEGATIVE
Glucose, UA: >=500 mg/dL — AB
Hgb urine dipstick: NEGATIVE
Ketones, ur: NEGATIVE mg/dL
Leukocytes,Ua: NEGATIVE
Nitrite: NEGATIVE
Protein, ur: NEGATIVE mg/dL
Specific Gravity, Urine: 1.011 (ref 1.005–1.030)
pH: 6 (ref 5.0–8.0)

## 2023-05-12 LAB — I-STAT CG4 LACTIC ACID, ED
Lactic Acid, Venous: 2.6 mmol/L (ref 0.5–1.9)
Lactic Acid, Venous: 2.7 mmol/L (ref 0.5–1.9)

## 2023-05-12 MED ORDER — HYALURONIDASE HUMAN 150 UNIT/ML IJ SOLN
150.0000 [IU] | Freq: Once | INTRAMUSCULAR | Status: AC
  Administered 2023-05-13: 150 [IU] via SUBCUTANEOUS
  Filled 2023-05-12: qty 1

## 2023-05-12 MED ORDER — SODIUM CHLORIDE 0.9 % IV BOLUS
500.0000 mL | Freq: Once | INTRAVENOUS | Status: AC
  Administered 2023-05-12: 500 mL via INTRAVENOUS

## 2023-05-12 MED ORDER — TRIMETHOBENZAMIDE HCL 100 MG/ML IM SOLN
100.0000 mg | Freq: Once | INTRAMUSCULAR | Status: AC
  Administered 2023-05-12: 100 mg via INTRAMUSCULAR
  Filled 2023-05-12: qty 1

## 2023-05-12 MED ORDER — LACTATED RINGERS IV SOLN: INTRAVENOUS | Status: DC

## 2023-05-12 MED ORDER — SODIUM CHLORIDE 0.9 % IV SOLN
2.0000 g | Freq: Once | INTRAVENOUS | Status: AC
  Administered 2023-05-12: 2 g via INTRAVENOUS
  Filled 2023-05-12: qty 12.5

## 2023-05-12 MED ORDER — LACTATED RINGERS IV BOLUS (SEPSIS)
1000.0000 mL | Freq: Once | INTRAVENOUS | Status: AC
Start: 2023-05-12 — End: 2023-05-12
  Administered 2023-05-12: 1000 mL via INTRAVENOUS

## 2023-05-12 MED ORDER — LORAZEPAM 2 MG/ML IJ SOLN
0.5000 mg | Freq: Once | INTRAMUSCULAR | Status: DC
  Filled 2023-05-12: qty 1

## 2023-05-12 MED ORDER — VANCOMYCIN HCL IN DEXTROSE 1-5 GM/200ML-% IV SOLN
1000.0000 mg | Freq: Once | INTRAVENOUS | Status: AC
  Administered 2023-05-12: 1000 mg via INTRAVENOUS
  Filled 2023-05-12: qty 200

## 2023-05-12 MED ORDER — LORAZEPAM 2 MG/ML PO CONC: 0.5000 mg | Freq: Once | ORAL | Status: DC

## 2023-05-12 MED ORDER — LACTATED RINGERS IV BOLUS (SEPSIS)
250.0000 mL | Freq: Once | INTRAVENOUS | Status: AC
Start: 2023-05-12 — End: 2023-05-12
  Administered 2023-05-12: 250 mL via INTRAVENOUS

## 2023-05-12 MED ORDER — VANCOMYCIN HCL IN DEXTROSE 1-5 GM/200ML-% IV SOLN: 1000.0000 mg | Freq: Once | INTRAVENOUS | Status: DC

## 2023-05-12 MED ORDER — FUROSEMIDE 10 MG/ML IJ SOLN
20.0000 mg | Freq: Once | INTRAMUSCULAR | Status: AC
Start: 2023-05-12 — End: 2023-05-12
  Administered 2023-05-12: 20 mg via INTRAVENOUS
  Filled 2023-05-12: qty 2

## 2023-05-12 MED ORDER — SCOPOLAMINE 1 MG/3DAYS TD PT72
1.0000 | MEDICATED_PATCH | TRANSDERMAL | Status: DC
  Administered 2023-05-12: 1.5 mg via TRANSDERMAL
  Filled 2023-05-12: qty 1

## 2023-05-12 MED ORDER — ACETAMINOPHEN 10 MG/ML IV SOLN
1000.0000 mg | Freq: Four times a day (QID) | INTRAVENOUS | Status: DC
  Administered 2023-05-12 – 2023-05-13 (×2): 1000 mg via INTRAVENOUS
  Filled 2023-05-12 (×3): qty 100

## 2023-05-12 MED ORDER — METRONIDAZOLE 500 MG/100ML IV SOLN
500.0000 mg | Freq: Once | INTRAVENOUS | Status: AC
  Administered 2023-05-12: 500 mg via INTRAVENOUS
  Filled 2023-05-12: qty 100

## 2023-05-12 NOTE — Progress Notes (Signed)
Confirmed with bedside RN that blood cultures drawn before antibiotics given, time was entered incorrect in lab.

## 2023-05-12 NOTE — Progress Notes (Signed)
ED Pharmacy Antibiotic Sign Off An antibiotic consult was received from an ED provider for cefepime and vancomycin per pharmacy dosing for sepsis. A chart review was completed to assess appropriateness.  A single dose of cefepime and vancomycin was placed by the ED provider.   The following one time order(s) were placed per pharmacy consult: none  Further antibiotic and/or antibiotic pharmacy consults should be ordered by the admitting provider if indicated.   Thank you for allowing pharmacy to be a part of this patient's care.   Delmar Landau, PharmD, BCPS 05/12/2023 4:02 PM ED Clinical Pharmacist -  [phone]

## 2023-05-12 NOTE — ED Notes (Signed)
Patient transported to CT 

## 2023-05-12 NOTE — ED Notes (Signed)
URINARY OUTPUT APPROX. 

## 2023-05-12 NOTE — Progress Notes (Signed)
Elink following for sepsis protocol. 

## 2023-05-12 NOTE — ED Provider Notes (Signed)
  Procedures  ED Course / MDM   Clinical Course as of 05/12/23 2336  Sat May 12, 2023  2115 Received sign out from Dr. Rhae Hammock pending MRI back. Patient with signs of sepsis with leukocytosis, hypotension, lactic acidosis. Otherwise without clear source.  [WS]  2334 Signed out to Dr. Blinda Leatherwood pending MRI to evaluate for occult infection.  [WS]    Clinical Course User Index [WS] Lonell Grandchild, MD         Lonell Grandchild, MD 05/12/23 [phone]

## 2023-05-12 NOTE — ED Provider Notes (Signed)
[facility] AT [facility] Provider Note   CSN: [id number] Arrival date & time: 05/12/23  1407     History  Chief Complaint  Patient presents with   Nausea   Back Pain   Dizziness    Heidi Stark is a 82 y.o. female.  82 year old female with past medical history of atrial fibrillation and ventricular tachycardia with ICD in place presenting to the emergency department today with dizziness and nausea after she took morphine for the first time earlier today.  She took 15 mg of morphine for pain earlier today.  She reports that she has chronic low back pain but this is acutely worse over the past 3 to 4 days.  She denies any known recent injuries.  The patient denies any bowel or bladder dysfunction and has not had any saddle anesthesia.  She denies any fevers.  She came to the emergency department today primarily due to her nausea and feeling unwell after taking the morphine.  She states that she took this at around noon today.    Back Pain Dizziness      Home Medications Prior to Admission medications   Medication Sig Start Date End Date Taking? Authorizing Provider  acetaminophen (TYLENOL) 500 MG tablet Take 325 mg by mouth every 6 (six) hours as needed for moderate pain or headache.    [provider]  amiodarone (PACERONE) 200 MG tablet Take 200 mg by mouth daily.    [provider]  apixaban (ELIQUIS) 5 MG TABS tablet Take 1 tablet (5 mg total) by mouth 2 (two) times daily. 06/13/22   Laurey Morale, MD  BD PEN NEEDLE NANO 2ND GEN 32G X 4 MM MISC See admin instructions. 09/24/21   [provider]  dapagliflozin propanediol (FARXIGA) 10 MG TABS tablet TAKE 1 TABLET BY MOUTH EVERY DAY BEFORE BREAKFAST 05/31/22   Zola Button, Grayling Congress, DO  ENTRESTO 24-26 MG TAKE 1 TABLET BY MOUTH TWICE A DAY 03/29/23   Jacklynn Ganong, FNP  eplerenone (INSPRA) 25 MG tablet TAKE 1 TABLET BY MOUTH EVERY DAY 02/21/23   Laurey Morale,  MD  fenofibrate (TRICOR) 145 MG tablet TAKE 1 TABLET BY MOUTH EVERY DAY 01/22/23   Laurey Morale, MD  furosemide (LASIX) 40 MG tablet Take 2 tablets (80 mg total) by mouth 2 (two) times daily. 04/26/23   Laurey Morale, MD  gabapentin (NEURONTIN) 300 MG capsule Take 300 mg by mouth daily as needed (pain). 07/31/22   [provider]  HUMALOG MIX 75/25 KWIKPEN (75-25) 100 UNIT/ML KwikPen Take 40 units with morning meal and 30 units with evening meal 05/31/22   Lowne Chase, Grayling Congress, DO  icosapent Ethyl (VASCEPA) 1 g capsule TAKE 2 CAPSULES BY MOUTH TWICE A DAY 08/08/22   Laurey Morale, MD  KLOR-CON M10 10 MEQ tablet TAKE 2 TABLETS BY MOUTH TWICE A DAY 08/22/22   Laurey Morale, MD  LIVALO 4 MG TABS TAKE 1 TABLET BY MOUTH EVERY DAY 05/01/23   Laurey Morale, MD  LORazepam (ATIVAN) 0.5 MG tablet Take 1 tablet (0.5 mg total) by mouth every 8 (eight) hours. 03/15/23   Seabron Spates R, DO  metoprolol succinate (TOPROL-XL) 25 MG 24 hr tablet TAKE 1 TABLET BY MOUTH EVERYDAY AT BEDTIME 04/23/23   Alen Bleacher, NP  mexiletine (MEXITIL) 150 MG capsule TAKE 2 CAPSULES (300 MG TOTAL) BY MOUTH 2 (TWO) TIMES DAILY. 05/10/23   Camnitz, Will Daphine Deutscher,  MD  Multiple Vitamin (MULTIVITAMIN WITH MINERALS) TABS tablet Take 1 tablet by mouth daily.    [provider]  ondansetron (ZOFRAN-ODT) 8 MG disintegrating tablet Take 1 tablet (8 mg total) by mouth every 8 (eight) hours as needed for nausea or vomiting. 04/09/23   Clayborne Dana, NP  [facility] VERIO test strip 1 each 3 (three) times daily. 10/09/21   [provider]  rOPINIRole (REQUIP) 0.25 MG tablet Take 0.25 mg by mouth as needed.    [provider]      Allergies    Neomycin-bacitracin zn-polymyx, Sulfonamide derivatives, Sulfa antibiotics, Ciprofloxacin, and Spironolactone    Review of Systems   Review of Systems  Musculoskeletal:  Positive for back pain.  Neurological:  Positive for dizziness.  All other systems  reviewed and are negative.   Physical Exam Updated Vital Signs BP (!) 129/109   Pulse (!) 35   Temp 98.7 F (37.1 C) (Oral)   Resp 19   SpO2 98%  Physical Exam Vitals and nursing note reviewed.   Gen: NAD Eyes: PERRL, EOMI HEENT: no oropharyngeal swelling Neck: trachea midline Resp: clear to auscultation bilaterally Card: RRR, no murmurs, rubs, or gallops Abd: nontender, nondistended Extremities: no calf tenderness, no edema MSK: Tender over the mid lumbar spine and right paraspinal region lumbar area, no significant thoracic lumbar tenderness is noted, no is a negative straight leg raise test. Vascular: 2+ radial pulses bilaterally, 2+ DP pulses bilaterally Neuro: Equal strength and sensation throughout bilateral lower extremities, unable to test for reflexes due to patient's body habitus Skin: no rashes Psyc: acting appropriately   ED Results / Procedures / Treatments   Labs (all labs ordered are listed, but only abnormal results are displayed) Labs Reviewed  CBC - Abnormal; Notable for the following components:      Result Value   WBC 22.2 (*)    RBC 5.36 (*)    Hemoglobin 16.2 (*)    HCT 50.3 (*)    RDW 15.6 (*)    All other components within normal limits  BASIC METABOLIC PANEL - Abnormal; Notable for the following components:   Potassium 3.4 (*)    Glucose, Bld 220 (*)    Creatinine, Ser 1.67 (*)    Calcium 8.4 (*)    GFR, Estimated 31 (*)    All other components within normal limits  URINALYSIS, ROUTINE W REFLEX MICROSCOPIC - Abnormal; Notable for the following components:   Glucose, UA >=500 (*)    All other components within normal limits  I-STAT CG4 LACTIC ACID, ED - Abnormal; Notable for the following components:   Lactic Acid, Venous 2.6 (*)    All other components within normal limits  CULTURE, BLOOD (SINGLE)  CULTURE, BLOOD (SINGLE)  I-STAT CG4 LACTIC ACID, ED    EKG EKG Interpretation Date/Time:  Saturday May 12 2023 14:18:13  EDT Ventricular Rate:  54 PR Interval:  267 QRS Duration:  193 QT Interval:  556 QTC Calculation: 527 R Axis:   -13  Text Interpretation: Sinus rhythm Prolonged PR interval Right bundle branch block Confirmed by Alvino Blood ([id number]) on 05/12/2023 9:15:12 PM  Radiology CT ABDOMEN PELVIS WO CONTRAST  Result Date: 05/12/2023 CLINICAL DATA:  Acute nonlocalized abdominal pain, known lumbar spine fracture EXAM: CT ABDOMEN AND PELVIS WITHOUT CONTRAST TECHNIQUE: Multidetector CT imaging of the abdomen and pelvis was performed following the standard protocol without IV contrast. RADIATION DOSE REDUCTION: This exam was performed according to the departmental dose-optimization program which includes automated  exposure control, adjustment of the mA and/or kV according to patient size and/or use of iterative reconstruction technique. COMPARISON:  05/12/2023 at 2:55 p.m., 06/12/2016 FINDINGS: Lower chest: Scattered bilateral lower lobe scarring or atelectasis. No acute airspace disease. Mild cardiomegaly, with calcification of the mitral and aortic valves. No pericardial effusion. Hepatobiliary: Small calcified gallstones without evidence of acute cholecystitis. Unremarkable unenhanced appearance of the liver. No biliary ductal dilation or evidence of choledocholithiasis. Pancreas: Unremarkable unenhanced appearance. Spleen: Unremarkable unenhanced appearance. Adrenals/Urinary Tract: No urinary tract calculi or obstructive uropathy within either kidney. 1.6 cm simple appearing cyst left kidney does not require specific follow-up. 1.4 cm slightly hyperdense lesion within the lateral mid right kidney reference image 26/3, measures 38 HU. This was previously evaluated in 2017, where it measured 1.3 cm demonstrating equivocal enhancement on that exam. Given the lack of significant interval increase in size, this likely reflects a hyperdense cyst. Stable left adrenal adenoma does not require imaging follow-up. The  right adrenals unremarkable. The bladder is grossly normal. Stomach/Bowel: No evidence of high-grade bowel obstruction. Scattered gas fluid levels are seen throughout the normal caliber small bowel, which could reflect sequela of gastroenteritis. No bowel wall thickening or inflammatory change. Normal retrocecal appendix. Vascular/Lymphatic: Aortic atherosclerosis. No enlarged abdominal or pelvic lymph nodes. Reproductive: Uterus and bilateral adnexa are unremarkable. Other: No free fluid or free intraperitoneal gas. There is a small right para umbilical Richter type hernia, containing a portion of the ventral wall of the mid transverse colon. Abdominal wall defect measures 2.6 x 2.2 cm. No evidence of incarceration or bowel wall ischemia. Musculoskeletal: No acute or destructive bony abnormalities. Stable appearance of the chronic inferior endplate compression deformity at L1. Stable diffuse multilevel lumbar spondylosis and facet hypertrophy as previously outlined on CT lumbar spine performed earlier today. Reconstructed images demonstrate no additional findings. IMPRESSION: 1. Scattered gas fluid levels throughout the nondistended small bowel, which may reflect sequela of gastroenteritis. No evidence of high-grade bowel obstruction. 2. Cholelithiasis without cholecystitis. 3. Indeterminate 1.4 cm slightly hyperdense right renal lesion, favor hyperdense cyst given the lack of significant change since 2017. Simple cyst left kidney. No specific imaging follow-up recommended. 4. Small right para umbilical Richter hernia containing a portion of the anti mesenteric wall of the mid transverse colon. No evidence of bowel incarceration or ischemia. 5.  Aortic Atherosclerosis (ICD10-I70.0). 6. Chronic L1 compression deformity and severe multilevel lumbar spondylosis and facet hypertrophy. Please see preceding CT lumbar spine for full description of findings. Electronically Signed   By: Sharlet Salina M.D.   On: 05/12/2023  19:44   DG Chest Portable 1 View  Result Date: 05/12/2023 CLINICAL DATA:  Sepsis EXAM: PORTABLE CHEST 1 VIEW COMPARISON:  04/20/2021 FINDINGS: Left-sided single lead pacing device as before. Hypoventilatory changes. Cardiomegaly with vascular congestion and mild interstitial edema. No focal consolidation, pleural effusion or pneumothorax. IMPRESSION: Cardiomegaly with vascular congestion and mild interstitial edema. Electronically Signed   By: Jasmine Pang M.D.   On: 05/12/2023 19:01   CT Lumbar Spine Wo Contrast  Result Date: 05/12/2023 CLINICAL DATA:  Lumbar compression fracture EXAM: CT LUMBAR SPINE WITHOUT CONTRAST TECHNIQUE: Multidetector CT imaging of the lumbar spine was performed without intravenous contrast administration. Multiplanar CT image reconstructions were also generated. RADIATION DOSE REDUCTION: This exam was performed according to the departmental dose-optimization program which includes automated exposure control, adjustment of the mA and/or kV according to patient size and/or use of iterative reconstruction technique. COMPARISON:  12/02/2022 CT scan FINDINGS: Segmentation: The lowest  lumbar type non-rib-bearing vertebra is labeled as L5. Alignment: No vertebral subluxation is observed. Vertebrae: 50% inferior endplate compression fracture at L1 with 1-2 mm of posterior bony retropulsion. This fracture was also visible on 12/02/2022. Minimal sclerosis along the compression fracture margin. No substantial increase in collapse compared to the prior exam. 3 mm degenerative retrolisthesis L2-3 and L4-5. Intervertebral vacuum disc phenomenon at all levels between T11 and S1, although only minimally at T12-L1. Loss of disc height at all levels between T11 and S1 except for T12-L1 and L1-2. New or acute compression fracture is observed. Paraspinal and other soft tissues: Atherosclerosis is present, including aortoiliac atherosclerotic disease. 1.6 cm in short axis left adrenal mass, internal  density 1.8 Hounsfield units, compatible with adenoma. No further imaging workup of this lesion is indicated. Disc levels: T12-L1: Unremarkable L1-2: Moderate central narrowing of the thecal sac due to posterior bony retropulsion, intervertebral spurring, and disc bulge. L2-3: Prominent central narrowing of the thecal sac and at least mild bilateral subarticular lateral recess stenosis due to disc osteophyte complex, short pedicles, and facet arthropathy. L3-4: Prominent central narrowing of the thecal sac with mild bilateral foraminal stenosis and at least moderate bilateral subarticular lateral recess stenosis due to disc osteophyte complex, facet arthropathy, and short pedicles. L4-5: Moderate to prominent central narrowing of the thecal sac with mild right and borderline left foraminal stenosis due to facet arthropathy, disc osteophyte complex, and short pedicles. Possible right laminectomy at this level in past. L5-S1: Prominent right and mild left foraminal stenosis and at least mild central narrowing of the thecal sac due to prominent right facet spurring and short pedicles. IMPRESSION: 1. 50% inferior endplate compression fracture at L1 with 1-2 mm of posterior bony retropulsion. This fracture was also visible on 12/02/2022. No substantial increase in collapse compared to the prior exam. 2. Lumbar spondylosis, congenitally short pedicles, and degenerative disc disease, causing prominent impingement at L2-3, L3-4, and L5-S1; moderate to prominent impingement at L4-5; and moderate impingement at L1-2. 3. Prominent right foraminal stenosis at L5-S1. 4. Aortic atherosclerosis. 5. 1.6 cm in short axis left adrenal adenoma. No further imaging workup of this lesion is indicated. Aortic Atherosclerosis (ICD10-I70.0). Electronically Signed   By: Gaylyn Rong M.D.   On: 05/12/2023 16:06    Procedures Procedures    Medications Ordered in ED Medications  lactated ringers infusion (0 mLs Intravenous Stopped  05/12/23 2020)  scopolamine (TRANSDERM-SCOP) 1 MG/3DAYS 1.5 mg (1.5 mg Transdermal Patch Applied 05/12/23 1756)  acetaminophen (OFIRMEV) IV 1,000 mg (0 mg Intravenous Stopped 05/12/23 1814)  LORazepam (ATIVAN) injection 0.5 mg (has no administration in time range)  furosemide (LASIX) injection 20 mg (has no administration in time range)  trimethobenzamide (TIGAN) injection 100 mg (100 mg Intramuscular Given 05/12/23 1548)  sodium chloride 0.9 % bolus 500 mL (500 mLs Intravenous Bolus 05/12/23 1545)  lactated ringers bolus 1,000 mL (0 mLs Intravenous Stopped 05/12/23 1710)    And  lactated ringers bolus 1,000 mL (0 mLs Intravenous Stopped 05/12/23 1824)    And  lactated ringers bolus 250 mL (0 mLs Intravenous Stopped 05/12/23 1824)  ceFEPIme (MAXIPIME) 2 g in sodium chloride 0.9 % 100 mL IVPB (0 g Intravenous Stopped 05/12/23 1655)  metroNIDAZOLE (FLAGYL) IVPB 500 mg (0 mg Intravenous Stopped 05/12/23 1811)  vancomycin (VANCOCIN) IVPB 1000 mg/200 mL premix (0 mg Intravenous Stopped 05/12/23 1725)    Followed by  vancomycin (VANCOCIN) IVPB 1000 mg/200 mL premix (0 mg Intravenous Stopped 05/12/23 2020)    ED  Course/ Medical Decision Making/ A&P Clinical Course as of 05/12/23 2118  Sat May 12, 2023  2115 Received sign out from Dr. Rhae Hammock pending MRI back. Patient with signs of sepsis with leukocytosis, hypotension, lactic acidosis. Otherwise without clear source.  [WS]    Clinical Course User Index [WS] Lonell Grandchild, MD                                 Medical Decision Making 82 year old female with past medical history of chronic back pain, diabetes, CHF, hypertension, hyperlipidemia, and ventricular tachycardia in the past with ICD presenting to the emergency department today with nausea after taking morphine for her back pain earlier today.  The patient does not have any red flag symptoms for cauda equina syndrome or cord compressing lesion at this time.  She is reporting worsening  back pain over the past few days.  Given her age I will obtain a CT scan of her back to evaluate for compression fractures or cancerous lesions.  I will keep the patient on the monitor here and obtain an EKG given her history of the ventricular tachycardia and atrial fibrillation.  I will give the patient some nausea medication after EKG.  I will reevaluate for ultimate disposition.  The patient's EKG interpreted by me shows a sinus rhythm with a rate of 54 with right bundle branch block and QTc of 527.  I do not appreciate any acute ischemic changes on her EKG.  The patient's blood pressure was found to be low on recheck.  Her white blood cell count did come back significantly elevated at 22,000.  Urinalysis in addition to chest x-ray are ordered for further evaluation for potential sepsis in addition to a lactic acid and blood cultures.  The patient is ordered 30 mL/kg based on her ideal body weight of 70 kg given her history of CHF.  Prior to this I did discuss the patient's QT prolongation and nausea with our pharmacist here.  She is ordered a dose of tigan at a decreased dose based on her age and previous creatinine clearance for her nausea.  There was a slight delay in the patient having antibiotics ordered as her initial presentation was not consistent with sepsis and this has been a further addition to the differential after she has been in the emergency department.   The patient's labs were revealing for a leukocytosis with a white count of 22,000.  The patient's urinalysis was unremarkable.  On reassessment the patient was complaining of some abdominal pain which she was attributing to her recurrent vomiting.  CT scan is ordered and does not show any acute findings other than potential gastroenteritis.  Given the patient's leukocytosis she was started on broad-spectrum antibiotics.  Her blood pressures did drop to the 60s systolic.  Her initial lactate was 2.6.  Her blood pressures improved with IV  fluids.  Her x-ray after some fluids does show some mild vascular congestion.  After her blood pressures improved I did give her a dose of Lasix.  She is stable on 2 L nasal cannula.  With her workup being negative thus far with the leukocytosis and chief complaint of back pain an MRI is ordered.  This is ordered without contrast after discussion with radiology.  She is already covered with broad-spectrum antibiotics in the event that this is due to discitis or osteomyelitis.  She will require admission after her MRI for further  management.  Signed out to Dr. Suezanne Jacquet.  Critical care time 40 minutes including reassessments, review of old records, treatment of hypotension with IV fluids, multiple reassessments  Amount and/or Complexity of Data Reviewed Labs: ordered. Radiology: ordered.  Risk Prescription drug management.           Final Clinical Impression(s) / ED Diagnoses Final diagnoses:  Leukocytosis, unspecified type  Hypotension, unspecified hypotension type    Rx / DC Orders ED Discharge Orders     None         Durwin Glaze, MD 05/12/23 2118

## 2023-05-12 NOTE — ED Triage Notes (Signed)
Patient BIB EMS from home with complaints of back pain. Noted in lower right side. While patient was at home she took 7.5mg  of morphine at 9am and then at 12 noon she took another 15mg  of morphine. It was noted that this medication is not prescribed to her. She is now complaining of nausea and dizziness since taking medication. She is noted to be hypotensive with a blood pressure of 86/49. She does have pacemaker. And EKG changes were noted by EMS.

## 2023-05-12 NOTE — ED Notes (Signed)
HYLENEX IS A ED PHYSICIAN MED. RNS DO NOT GIVE. EDP MADE AWARE.

## 2023-05-12 NOTE — ED Notes (Signed)
WILL GIVE ATIVAN WHEN MRI ARRIVES.

## 2023-05-13 DIAGNOSIS — N183 Chronic kidney disease, stage 3 unspecified: Secondary | ICD-10-CM | POA: Diagnosis not present

## 2023-05-13 DIAGNOSIS — G8929 Other chronic pain: Secondary | ICD-10-CM

## 2023-05-13 DIAGNOSIS — E871 Hypo-osmolality and hyponatremia: Secondary | ICD-10-CM | POA: Diagnosis present

## 2023-05-13 DIAGNOSIS — A419 Sepsis, unspecified organism: Principal | ICD-10-CM

## 2023-05-13 DIAGNOSIS — E1122 Type 2 diabetes mellitus with diabetic chronic kidney disease: Secondary | ICD-10-CM | POA: Diagnosis present

## 2023-05-13 DIAGNOSIS — Z1152 Encounter for screening for COVID-19: Secondary | ICD-10-CM | POA: Diagnosis not present

## 2023-05-13 DIAGNOSIS — M545 Low back pain, unspecified: Secondary | ICD-10-CM

## 2023-05-13 DIAGNOSIS — Z66 Do not resuscitate: Secondary | ICD-10-CM | POA: Diagnosis present

## 2023-05-13 DIAGNOSIS — E1165 Type 2 diabetes mellitus with hyperglycemia: Secondary | ICD-10-CM | POA: Diagnosis present

## 2023-05-13 DIAGNOSIS — L03116 Cellulitis of left lower limb: Secondary | ICD-10-CM | POA: Diagnosis present

## 2023-05-13 DIAGNOSIS — E785 Hyperlipidemia, unspecified: Secondary | ICD-10-CM | POA: Diagnosis present

## 2023-05-13 DIAGNOSIS — E1169 Type 2 diabetes mellitus with other specified complication: Secondary | ICD-10-CM | POA: Diagnosis present

## 2023-05-13 DIAGNOSIS — M4856XS Collapsed vertebra, not elsewhere classified, lumbar region, sequela of fracture: Secondary | ICD-10-CM | POA: Diagnosis not present

## 2023-05-13 DIAGNOSIS — I4892 Unspecified atrial flutter: Secondary | ICD-10-CM | POA: Diagnosis present

## 2023-05-13 DIAGNOSIS — S90522A Blister (nonthermal), left ankle, initial encounter: Secondary | ICD-10-CM | POA: Diagnosis present

## 2023-05-13 DIAGNOSIS — J449 Chronic obstructive pulmonary disease, unspecified: Secondary | ICD-10-CM | POA: Diagnosis present

## 2023-05-13 DIAGNOSIS — I130 Hypertensive heart and chronic kidney disease with heart failure and stage 1 through stage 4 chronic kidney disease, or unspecified chronic kidney disease: Secondary | ICD-10-CM | POA: Diagnosis present

## 2023-05-13 DIAGNOSIS — I5033 Acute on chronic diastolic (congestive) heart failure: Secondary | ICD-10-CM

## 2023-05-13 DIAGNOSIS — R6520 Severe sepsis without septic shock: Secondary | ICD-10-CM | POA: Diagnosis present

## 2023-05-13 DIAGNOSIS — E872 Acidosis, unspecified: Secondary | ICD-10-CM | POA: Diagnosis present

## 2023-05-13 DIAGNOSIS — I4819 Other persistent atrial fibrillation: Secondary | ICD-10-CM | POA: Diagnosis present

## 2023-05-13 DIAGNOSIS — I700 Atherosclerosis of aorta: Secondary | ICD-10-CM | POA: Diagnosis present

## 2023-05-13 DIAGNOSIS — Z6841 Body Mass Index (BMI) 40.0 and over, adult: Secondary | ICD-10-CM | POA: Diagnosis not present

## 2023-05-13 DIAGNOSIS — Z794 Long term (current) use of insulin: Secondary | ICD-10-CM | POA: Diagnosis not present

## 2023-05-13 DIAGNOSIS — M549 Dorsalgia, unspecified: Secondary | ICD-10-CM | POA: Diagnosis not present

## 2023-05-13 DIAGNOSIS — I428 Other cardiomyopathies: Secondary | ICD-10-CM | POA: Diagnosis present

## 2023-05-13 DIAGNOSIS — N1832 Chronic kidney disease, stage 3b: Secondary | ICD-10-CM | POA: Diagnosis present

## 2023-05-13 DIAGNOSIS — E876 Hypokalemia: Secondary | ICD-10-CM | POA: Diagnosis present

## 2023-05-13 DIAGNOSIS — N179 Acute kidney failure, unspecified: Secondary | ICD-10-CM | POA: Diagnosis present

## 2023-05-13 DIAGNOSIS — E039 Hypothyroidism, unspecified: Secondary | ICD-10-CM | POA: Diagnosis present

## 2023-05-13 LAB — CBC
HCT: 49.4 % — ABNORMAL HIGH (ref 36.0–46.0)
Hemoglobin: 16.7 g/dL — ABNORMAL HIGH (ref 12.0–15.0)
MCH: 30.9 pg (ref 26.0–34.0)
MCHC: 33.8 g/dL (ref 30.0–36.0)
MCV: 91.5 fL (ref 80.0–100.0)
Platelets: 159 10*3/uL (ref 150–400)
RBC: 5.4 MIL/uL — ABNORMAL HIGH (ref 3.87–5.11)
RDW: 15.7 % — ABNORMAL HIGH (ref 11.5–15.5)
WBC: 14.7 10*3/uL — ABNORMAL HIGH (ref 4.0–10.5)
nRBC: 0 % (ref 0.0–0.2)

## 2023-05-13 LAB — GLUCOSE, CAPILLARY
Glucose-Capillary: 149 mg/dL — ABNORMAL HIGH (ref 70–99)
Glucose-Capillary: 151 mg/dL — ABNORMAL HIGH (ref 70–99)

## 2023-05-13 LAB — CBG MONITORING, ED
Glucose-Capillary: 212 mg/dL — ABNORMAL HIGH (ref 70–99)
Glucose-Capillary: 216 mg/dL — ABNORMAL HIGH (ref 70–99)
Glucose-Capillary: 191 mg/dL — ABNORMAL HIGH (ref 70–99)
Glucose-Capillary: 179 mg/dL — ABNORMAL HIGH (ref 70–99)

## 2023-05-13 LAB — COMPREHENSIVE METABOLIC PANEL
ALT: 30 U/L (ref 0–44)
AST: 57 U/L — ABNORMAL HIGH (ref 15–41)
Albumin: 3 g/dL — ABNORMAL LOW (ref 3.5–5.0)
Alkaline Phosphatase: 42 U/L (ref 38–126)
Anion gap: 14 (ref 5–15)
BUN: 21 mg/dL (ref 8–23)
CO2: 20 mmol/L — ABNORMAL LOW (ref 22–32)
Calcium: 7.7 mg/dL — ABNORMAL LOW (ref 8.9–10.3)
Chloride: 98 mmol/L (ref 98–111)
Creatinine, Ser: 1.65 mg/dL — ABNORMAL HIGH (ref 0.44–1.00)
GFR, Estimated: 31 mL/min — ABNORMAL LOW (ref >60)
Glucose, Bld: 207 mg/dL — ABNORMAL HIGH (ref 70–99)
Potassium: 3.8 mmol/L (ref 3.5–5.1)
Sodium: 132 mmol/L — ABNORMAL LOW (ref 135–145)
Total Bilirubin: 1.1 mg/dL (ref 0.3–1.2)
Total Protein: 6.4 g/dL — ABNORMAL LOW (ref 6.5–8.1)

## 2023-05-13 LAB — HEMOGLOBIN A1C
Hgb A1c MFr Bld: 6.7 % — ABNORMAL HIGH (ref 4.8–5.6)
Mean Plasma Glucose: 145.59 mg/dL

## 2023-05-13 LAB — BRAIN NATRIURETIC PEPTIDE: B Natriuretic Peptide: 274.7 pg/mL — ABNORMAL HIGH (ref 0.0–100.0)

## 2023-05-13 LAB — MRSA NEXT GEN BY PCR, NASAL: MRSA by PCR Next Gen: DETECTED — AB

## 2023-05-13 LAB — LACTIC ACID, PLASMA: Lactic Acid, Venous: 2.3 mmol/L (ref 0.5–1.9)

## 2023-05-13 LAB — MAGNESIUM: Magnesium: 1.8 mg/dL (ref 1.7–2.4)

## 2023-05-13 LAB — PROCALCITONIN: Procalcitonin: 1.94 ng/mL

## 2023-05-13 MED ORDER — MUPIROCIN 2 % EX OINT
1.0000 | TOPICAL_OINTMENT | Freq: Two times a day (BID) | CUTANEOUS | Status: AC
  Administered 2023-05-14 – 2023-05-18 (×10): 1 via NASAL
  Filled 2023-05-13: qty 22

## 2023-05-13 MED ORDER — CHLORHEXIDINE GLUCONATE CLOTH 2 % EX PADS
6.0000 | MEDICATED_PAD | Freq: Every day | CUTANEOUS | Status: DC
  Administered 2023-05-13 – 2023-05-19 (×7): 6 via TOPICAL

## 2023-05-13 MED ORDER — LEVOTHYROXINE SODIUM 25 MCG PO TABS
25.0000 ug | ORAL_TABLET | ORAL | Status: DC
  Administered 2023-05-14 – 2023-05-18 (×3): 25 ug via ORAL
  Filled 2023-05-13 (×3): qty 1

## 2023-05-13 MED ORDER — VANCOMYCIN HCL 750 MG/150ML IV SOLN
750.0000 mg | INTRAVENOUS | Status: DC
  Administered 2023-05-13 – 2023-05-14 (×2): 750 mg via INTRAVENOUS
  Filled 2023-05-13 (×3): qty 150

## 2023-05-13 MED ORDER — PRAVASTATIN SODIUM 40 MG PO TABS
80.0000 mg | ORAL_TABLET | Freq: Every day | ORAL | Status: DC
  Administered 2023-05-13 – 2023-05-18 (×6): 80 mg via ORAL
  Filled 2023-05-13 (×6): qty 2

## 2023-05-13 MED ORDER — ALBUTEROL SULFATE (2.5 MG/3ML) 0.083% IN NEBU: 2.5000 mg | INHALATION_SOLUTION | Freq: Four times a day (QID) | RESPIRATORY_TRACT | Status: DC | PRN

## 2023-05-13 MED ORDER — METRONIDAZOLE 500 MG/100ML IV SOLN
500.0000 mg | Freq: Two times a day (BID) | INTRAVENOUS | Status: DC
  Administered 2023-05-13 – 2023-05-15 (×5): 500 mg via INTRAVENOUS
  Filled 2023-05-13 (×5): qty 100

## 2023-05-13 MED ORDER — MEXILETINE HCL 150 MG PO CAPS
300.0000 mg | ORAL_CAPSULE | Freq: Two times a day (BID) | ORAL | Status: DC
  Administered 2023-05-13 – 2023-05-19 (×14): 300 mg via ORAL
  Filled 2023-05-13 (×15): qty 2

## 2023-05-13 MED ORDER — SODIUM CHLORIDE 0.9 % IV SOLN
2.0000 g | Freq: Two times a day (BID) | INTRAVENOUS | Status: DC
  Administered 2023-05-13 – 2023-05-15 (×5): 2 g via INTRAVENOUS
  Filled 2023-05-13 (×5): qty 12.5

## 2023-05-13 MED ORDER — INSULIN GLARGINE-YFGN 100 UNIT/ML ~~LOC~~ SOLN
10.0000 [IU] | Freq: Every day | SUBCUTANEOUS | Status: DC
  Administered 2023-05-13 – 2023-05-19 (×7): 10 [IU] via SUBCUTANEOUS
  Filled 2023-05-13 (×7): qty 0.1

## 2023-05-13 MED ORDER — ICOSAPENT ETHYL 1 G PO CAPS
2.0000 g | ORAL_CAPSULE | Freq: Two times a day (BID) | ORAL | Status: DC
  Administered 2023-05-13 – 2023-05-19 (×13): 2 g via ORAL
  Filled 2023-05-13 (×15): qty 2

## 2023-05-13 MED ORDER — INSULIN LISPRO PROT & LISPRO (75-25 MIX) 100 UNIT/ML KWIKPEN: 15.0000 [IU] | PEN_INJECTOR | Freq: Two times a day (BID) | SUBCUTANEOUS | Status: DC

## 2023-05-13 MED ORDER — POTASSIUM CHLORIDE CRYS ER 20 MEQ PO TBCR
40.0000 meq | EXTENDED_RELEASE_TABLET | Freq: Once | ORAL | Status: AC
  Administered 2023-05-13: 40 meq via ORAL
  Filled 2023-05-13: qty 2

## 2023-05-13 MED ORDER — GABAPENTIN 300 MG PO CAPS
300.0000 mg | ORAL_CAPSULE | Freq: Every day | ORAL | Status: DC | PRN
  Administered 2023-05-13 – 2023-05-18 (×2): 300 mg via ORAL
  Filled 2023-05-13 (×2): qty 1

## 2023-05-13 MED ORDER — APIXABAN 5 MG PO TABS
5.0000 mg | ORAL_TABLET | Freq: Two times a day (BID) | ORAL | Status: DC
  Administered 2023-05-13 – 2023-05-19 (×13): 5 mg via ORAL
  Filled 2023-05-13 (×13): qty 1

## 2023-05-13 MED ORDER — FENOFIBRATE 160 MG PO TABS
160.0000 mg | ORAL_TABLET | Freq: Every day | ORAL | Status: DC
  Administered 2023-05-13 – 2023-05-19 (×7): 160 mg via ORAL
  Filled 2023-05-13 (×8): qty 1

## 2023-05-13 MED ORDER — TRIMETHOBENZAMIDE HCL 100 MG/ML IM SOLN
200.0000 mg | Freq: Three times a day (TID) | INTRAMUSCULAR | Status: DC | PRN
  Administered 2023-05-13: 200 mg via INTRAMUSCULAR
  Filled 2023-05-13: qty 2

## 2023-05-13 MED ORDER — ACETAMINOPHEN 500 MG PO TABS
1000.0000 mg | ORAL_TABLET | Freq: Four times a day (QID) | ORAL | Status: DC
  Administered 2023-05-13 – 2023-05-15 (×6): 1000 mg via ORAL
  Filled 2023-05-13 (×6): qty 2

## 2023-05-13 MED ORDER — INSULIN ASPART 100 UNIT/ML IJ SOLN
0.0000 [IU] | INTRAMUSCULAR | Status: DC
  Administered 2023-05-13: 3 [IU] via SUBCUTANEOUS
  Administered 2023-05-13 (×2): 2 [IU] via SUBCUTANEOUS
  Administered 2023-05-13: 1 [IU] via SUBCUTANEOUS
  Administered 2023-05-14 (×5): 2 [IU] via SUBCUTANEOUS
  Administered 2023-05-15: 1 [IU] via SUBCUTANEOUS
  Administered 2023-05-15: 2 [IU] via SUBCUTANEOUS
  Administered 2023-05-15: 1 [IU] via SUBCUTANEOUS
  Administered 2023-05-15: 2 [IU] via SUBCUTANEOUS

## 2023-05-13 MED ORDER — LORAZEPAM 0.5 MG PO TABS
0.5000 mg | ORAL_TABLET | Freq: Four times a day (QID) | ORAL | Status: DC | PRN
  Administered 2023-05-14 – 2023-05-18 (×4): 0.5 mg via ORAL
  Filled 2023-05-13 (×4): qty 1

## 2023-05-13 MED ORDER — AMIODARONE HCL 200 MG PO TABS
200.0000 mg | ORAL_TABLET | Freq: Every day | ORAL | Status: DC
  Administered 2023-05-13 – 2023-05-19 (×7): 200 mg via ORAL
  Filled 2023-05-13 (×7): qty 1

## 2023-05-13 MED ORDER — METOPROLOL SUCCINATE ER 25 MG PO TB24
25.0000 mg | ORAL_TABLET | Freq: Every day | ORAL | Status: DC
  Administered 2023-05-13 – 2023-05-17 (×5): 25 mg via ORAL
  Filled 2023-05-13 (×6): qty 1

## 2023-05-13 NOTE — ED Provider Notes (Signed)
Patient signed out to me by Dr. Suezanne Jacquet.  I received notice that pharmacy wanted hyaluronidase infiltrated around the site of her prior IV infiltration.  I did perform this.  There is a small hematoma at the site but otherwise no significant findings currently.  Further review reveals that the patient has an AICD.  She cannot receive an MRI until Monday morning when a tech is available.  Will admit to hospitalist.   Gilda Crease, MD 05/13/23 0110

## 2023-05-13 NOTE — ED Notes (Signed)
TABLET, CELL, AND CPAP AND OTHER ITEMS ARE LOCATED IN A LIGHT PURPLE TOTE BAG LEFT BEHIND FOR PT BY FAMILY. WILL BE SENT WITH PT WHEN ROOM HAS BEEN APPROVED.

## 2023-05-13 NOTE — ED Notes (Signed)
Per MRI tech, the pt has a pacemaker. In order to do the MRI, the representative from the pacemaker company comes to the MRI dept and consults with the pt's cardiologist. They put the pacemaker in procedure mode and observe her during the MRI. These representatives are only available Mon-Fri.

## 2023-05-13 NOTE — H&P (Signed)
History and Physical    Heidi Stark EXB:[id number] DOB: 11-15-1940 DOA: 05/12/2023  PCP: Donato Schultz, DO  Patient coming from: Home  Chief Complaint: Back pain  HPI: Heidi Stark is a 82 y.o. 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  Patient took morphine 7.5 mg in the morning around 9 AM and another 15 mg of morphine at around noon.  After taking morphine she started having nausea and dizziness.  Afebrile.  Noted to be hypotensive in the ED with SBP in the 60s.  Labs showing WBC count 22.2, hemoglobin 16.2 (slightly elevated on previous labs as well), potassium 3.4, glucose 220, creatinine 1.6 (baseline 1.3-1.5), lactic acid 2.6> 2.7, blood cultures collected, UA not suggestive of infection.  Chest x-ray showing cardiomegaly with vascular congestion and mild interstitial edema.  CT of lumbar spine/abdomen/pelvis negative for acute findings. MRI of thoracic and lumbar spine ordered to rule out discitis/osteomyelitis but cannot be done overnight due to presence of ICD.  Patient was given IV Lasix 20 mg, Tigan, Tylenol, 2.75 L IV fluid boluses, and IV antibiotics including vancomycin, cefepime, and Flagyl in the ED.  Blood pressure improved.  TRH called to admit.  Patient reports history of chronic back pain related to ruptured discs for which she required surgery back in the 1990s.  For the past 3 days she is having worsening pain in her mid to lower back on the right side.  Denies any falls or back injury.  Due to having pain at home, she took her daughter's morphine pills.  Took 7.5 mg around 9 AM and then another 15 mg of morphine around noon after which  she started having dizziness, nausea, and vomiting.  This prompted her to come into the ED to be evaluated.  Her symptoms have now improved and also reports significant improvement of her back pain.  Denies any saddle anesthesia, bowel/bladder dysfunction, or lower extremity weakness/numbness.  Denies any fevers at home.  Denies chest pain, shortness of breath, or abdominal pain.  Review of Systems:  Review of Systems  All other systems reviewed and are negative.   Past Medical History:  Diagnosis Date   Arthritis    "hands, wrists" (11/07/2016)   CHF (congestive heart failure) (HCC)    Chronic lower back pain    Constipation    DVT (deep venous thrombosis) (HCC)    History of blood transfusion 1990   "related to OR"   History of kidney stones    Hyperlipidemia    Hypertension    Joint pain    Lower extremity edema    OSA on CPAP    Persistent atrial fibrillation (HCC)    Pneumonia    "several times" (11/07/2016)   Type II diabetes mellitus (HCC)     Past Surgical History:  Procedure Laterality Date   ATRIAL FIBRILLATION ABLATION N/A 11/07/2016   Procedure: Atrial Fibrillation Ablation;  Surgeon: Will Jorja Loa, MD;  Location: [facility] INVASIVE CV LAB;  Service: Cardiovascular;  Laterality: N/A;   BACK SURGERY     CARDIAC CATHETERIZATION     CARDIOVERSION N/A 08/11/2016   Procedure: CARDIOVERSION;  Surgeon: Laurey Morale, MD;  Location: [facility] ENDOSCOPY;  Service: Cardiovascular;  Laterality:  N/A;   CARDIOVERSION N/A 12/03/2017   Procedure: CARDIOVERSION;  Surgeon: Laurey Morale, MD;  Location: [facility] ENDOSCOPY;  Service: Cardiovascular;  Laterality: N/A;   CARDIOVERSION N/A 05/02/2018   Procedure: CARDIOVERSION;  Surgeon: Chrystie Nose, MD;  Location: [facility] ENDOSCOPY;  Service: Cardiovascular;  Laterality: N/A;   CARDIOVERSION N/A 06/23/2020   Procedure: CARDIOVERSION;  Surgeon: Meriam Sprague, MD;  Location: [facility] ENDOSCOPY;  Service: Cardiovascular;  Laterality: N/A;    CARDIOVERSION N/A 10/11/2020   Procedure: CARDIOVERSION;  Surgeon: Laurey Morale, MD;  Location: [facility] ENDOSCOPY;  Service: Cardiovascular;  Laterality: N/A;   CARPAL TUNNEL RELEASE     CATARACT EXTRACTION W/ INTRAOCULAR LENS  IMPLANT, BILATERAL Bilateral    COLON SURGERY  1990   vein graft and colon repair after nicked artery with back surgert   COLONOSCOPY     CORONARY ANGIOGRAPHY N/A 09/08/2020   Procedure: CORONARY ANGIOGRAPHY;  Surgeon: Laurey Morale, MD;  Location: [facility] INVASIVE CV LAB;  Service: Cardiovascular;  Laterality: N/A;   CYSTECTOMY     between bladder and kidneys   GANGLION CYST EXCISION Left    ICD IMPLANT N/A 09/15/2020   Procedure: ICD IMPLANT;  Surgeon: Regan Lemming, MD;  Location: [facility] INVASIVE CV LAB;  Service: Cardiovascular;  Laterality: N/A;   KNEE CARTILAGE SURGERY Right 1960s   LUMBAR DISC SURGERY  1990   REPAIR ILIAC ARTERY  1990   vein graft and colon repair after nicked artery with back surgert   TEE WITHOUT CARDIOVERSION N/A 10/31/2013   Procedure: TRANSESOPHAGEAL ECHOCARDIOGRAM (TEE);  Surgeon: Laurey Morale, MD;  Location: [facility] ENDOSCOPY;  Service: Cardiovascular;  Laterality: N/A;   TUBAL LIGATION       reports that she quit smoking about 2 years ago. Her smoking use included cigarettes. She started smoking about 52 years ago. She has a 50 pack-year smoking history. She has never used smokeless tobacco. She reports that she does not drink alcohol and does not use drugs.  Allergies  Allergen Reactions   Neomycin-Bacitracin Zn-Polymyx Rash   Sulfonamide Derivatives Other (See Comments)    Stomach cramps   Sulfa Antibiotics Other (See Comments)   Ciprofloxacin Hives, Itching and Rash    Pt doesn't remember having any reaction to cipro   Spironolactone Rash    Family History  Problem Relation Age of Onset   Diabetes Mother    Hypertension Mother    Cancer Mother    Obesity Mother    Diabetes Father    Obesity Father    Diabetes Other     Melanoma Other    Factor V Leiden deficiency Daughter     Prior to Admission medications   Medication Sig Start Date End Date Taking? Authorizing Provider  acetaminophen (TYLENOL) 500 MG tablet Take 500 mg by mouth every 6 (six) hours as needed for moderate pain (pain score 4-6) or headache.   Yes [provider]  amiodarone (PACERONE) 200 MG tablet Take 200 mg by mouth daily.   Yes [provider]  apixaban (ELIQUIS) 5 MG TABS tablet Take 1 tablet (5 mg total) by mouth 2 (two) times daily. 06/13/22  Yes Laurey Morale, MD  dapagliflozin propanediol (FARXIGA) 10 MG TABS tablet TAKE 1 TABLET BY MOUTH EVERY DAY BEFORE BREAKFAST 05/31/22  Yes Donato Schultz, DO  ENTRESTO 24-26 MG TAKE 1 TABLET BY MOUTH TWICE A DAY 03/29/23  Yes Milford, Jessica M, FNP  eplerenone (INSPRA) 25 MG tablet TAKE 1 TABLET BY MOUTH  EVERY DAY 02/21/23  Yes Laurey Morale, MD  fenofibrate (TRICOR) 145 MG tablet TAKE 1 TABLET BY MOUTH EVERY DAY 01/22/23  Yes Laurey Morale, MD  furosemide (LASIX) 40 MG tablet Take 2 tablets (80 mg total) by mouth 2 (two) times daily. 04/26/23  Yes Laurey Morale, MD  gabapentin (NEURONTIN) 300 MG capsule Take 300 mg by mouth daily as needed (pain). 07/31/22  Yes [provider]  HUMALOG MIX 75/25 KWIKPEN (75-25) 100 UNIT/ML KwikPen Take 40 units with morning meal and 30 units with evening meal Patient taking differently: Inject 30 Units into the skin 2 (two) times daily. 05/31/22  Yes Seabron Spates R, DO  icosapent Ethyl (VASCEPA) 1 g capsule TAKE 2 CAPSULES BY MOUTH TWICE A DAY 08/08/22  Yes Laurey Morale, MD  KLOR-CON M10 10 MEQ tablet TAKE 2 TABLETS BY MOUTH TWICE A DAY 08/22/22  Yes Laurey Morale, MD  levothyroxine (SYNTHROID) 25 MCG tablet 25 mcg 3 (three) times a week. 04/24/23  Yes [provider]  LIVALO 4 MG TABS TAKE 1 TABLET BY MOUTH EVERY DAY 05/01/23  Yes Laurey Morale, MD  LORazepam (ATIVAN) 0.5 MG tablet Take 1 tablet (0.5  mg total) by mouth every 8 (eight) hours. 03/15/23  Yes Seabron Spates R, DO  metoprolol succinate (TOPROL-XL) 25 MG 24 hr tablet TAKE 1 TABLET BY MOUTH EVERYDAY AT BEDTIME 04/23/23  Yes Diaz, Alma L, NP  mexiletine (MEXITIL) 150 MG capsule TAKE 2 CAPSULES (300 MG TOTAL) BY MOUTH 2 (TWO) TIMES DAILY. 05/10/23  Yes Camnitz, Will Daphine Deutscher, MD  Multiple Vitamin (MULTIVITAMIN WITH MINERALS) TABS tablet Take 1 tablet by mouth daily.   Yes [provider]  ondansetron (ZOFRAN-ODT) 8 MG disintegrating tablet Take 1 tablet (8 mg total) by mouth every 8 (eight) hours as needed for nausea or vomiting. 04/09/23  Yes Clayborne Dana, NP  BD PEN NEEDLE NANO 2ND GEN 32G X 4 MM MISC See admin instructions. 09/24/21   [provider]  [facility] VERIO test strip 1 each 3 (three) times daily. 10/09/21   [provider]  rOPINIRole (REQUIP) 0.25 MG tablet Take 0.25 mg by mouth as needed.    [provider]    Physical Exam: Vitals:   05/12/23 1730 05/12/23 2117 05/12/23 2121 05/12/23 2315  BP: (!) 129/109  (!) 100/56 (!) 102/54  Pulse: (!) 35 72 72 80  Resp: 19 20 (!) 22 (!) 24  Temp:   (!) 97.3 F (36.3 C)   TempSrc:  Oral Oral   SpO2: 98% 98% 97% 99%    Physical Exam Vitals reviewed.  Constitutional:      General: She is not in acute distress. HENT:     Head: Normocephalic and atraumatic.  Eyes:     Extraocular Movements: Extraocular movements intact.  Cardiovascular:     Rate and Rhythm: Normal rate and regular rhythm.     Pulses: Normal pulses.  Pulmonary:     Effort: Pulmonary effort is normal. No respiratory distress.     Breath sounds: Rales present. No wheezing.  Abdominal:     General: Bowel sounds are normal. There is no distension.     Palpations: Abdomen is soft.     Tenderness: There is no abdominal tenderness. There is no guarding.  Musculoskeletal:     Cervical back: Normal range of motion.     Right lower leg: No edema.     Left lower leg: No  edema.  Skin:    General: Skin is warm and dry.  Neurological:     General: No focal deficit present.     Mental Status: She is alert and oriented to person, place, and time.     Comments: Strength 5 out of 5 in bilateral lower extremities and sensation to light touch intact.     Labs on Admission: I have personally reviewed following labs and imaging studies  CBC: Recent Labs  Lab 05/12/23 1535  WBC 22.2*  HGB 16.2*  HCT 50.3*  MCV 93.8  PLT 178   Basic Metabolic Panel: Recent Labs  Lab 05/12/23 1535  NA 136  K 3.4*  CL 98  CO2 23  GLUCOSE 220*  BUN 17  CREATININE 1.67*  CALCIUM 8.4*   GFR: CrCl cannot be calculated (Unknown ideal weight.). Liver Function Tests: No results for input(s): "AST", "ALT", "ALKPHOS", "BILITOT", "PROT", "ALBUMIN" in the last 168 hours. No results for input(s): "LIPASE", "AMYLASE" in the last 168 hours. No results for input(s): "AMMONIA" in the last 168 hours. Coagulation Profile: No results for input(s): "INR", "PROTIME" in the last 168 hours. Cardiac Enzymes: No results for input(s): "CKTOTAL", "CKMB", "CKMBINDEX", "TROPONINI" in the last 168 hours. BNP (last 3 results) No results for input(s): "PROBNP" in the last 8760 hours. HbA1C: No results for input(s): "HGBA1C" in the last 72 hours. CBG: No results for input(s): "GLUCAP" in the last 168 hours. Lipid Profile: No results for input(s): "CHOL", "HDL", "LDLCALC", "TRIG", "CHOLHDL", "LDLDIRECT" in the last 72 hours. Thyroid Function Tests: No results for input(s): "TSH", "T4TOTAL", "FREET4", "T3FREE", "THYROIDAB" in the last 72 hours. Anemia Panel: No results for input(s): "VITAMINB12", "FOLATE", "FERRITIN", "TIBC", "IRON", "RETICCTPCT" in the last 72 hours. Urine analysis:    Component Value Date/Time   COLORURINE YELLOW 05/12/2023 2016   APPEARANCEUR CLEAR 05/12/2023 2016   LABSPEC 1.011 05/12/2023 2016   PHURINE 6.0 05/12/2023 2016   GLUCOSEU >=500 (A) 05/12/2023 2016    GLUCOSEU Negative 11/30/2011 0804   HGBUR NEGATIVE 05/12/2023 2016   BILIRUBINUR NEGATIVE 05/12/2023 2016   BILIRUBINUR neg 05/18/2016 0932   KETONESUR NEGATIVE 05/12/2023 2016   PROTEINUR NEGATIVE 05/12/2023 2016   UROBILINOGEN negative 05/18/2016 0932   UROBILINOGEN 1.0 11/23/2014 2250   NITRITE NEGATIVE 05/12/2023 2016   LEUKOCYTESUR NEGATIVE 05/12/2023 2016    Radiological Exams on Admission: CT ABDOMEN PELVIS WO CONTRAST  Result Date: 05/12/2023 CLINICAL DATA:  Acute nonlocalized abdominal pain, known lumbar spine fracture EXAM: CT ABDOMEN AND PELVIS WITHOUT CONTRAST TECHNIQUE: Multidetector CT imaging of the abdomen and pelvis was performed following the standard protocol without IV contrast. RADIATION DOSE REDUCTION: This exam was performed according to the departmental dose-optimization program which includes automated exposure control, adjustment of the mA and/or kV according to patient size and/or use of iterative reconstruction technique. COMPARISON:  05/12/2023 at 2:55 p.m., 06/12/2016 FINDINGS: Lower chest: Scattered bilateral lower lobe scarring or atelectasis. No acute airspace disease. Mild cardiomegaly, with calcification of the mitral and aortic valves. No pericardial effusion. Hepatobiliary: Small calcified gallstones without evidence of acute cholecystitis. Unremarkable unenhanced appearance of the liver. No biliary ductal dilation or evidence of choledocholithiasis. Pancreas: Unremarkable unenhanced appearance. Spleen: Unremarkable unenhanced appearance. Adrenals/Urinary Tract: No urinary tract calculi or obstructive uropathy within either kidney. 1.6 cm simple appearing cyst left kidney does not require specific follow-up. 1.4 cm slightly hyperdense lesion within the lateral mid right kidney reference image 26/3, measures 38 HU. This was previously evaluated in 2017, where it measured 1.3 cm demonstrating equivocal  enhancement on that exam. Given the lack of significant interval  increase in size, this likely reflects a hyperdense cyst. Stable left adrenal adenoma does not require imaging follow-up. The right adrenals unremarkable. The bladder is grossly normal. Stomach/Bowel: No evidence of high-grade bowel obstruction. Scattered gas fluid levels are seen throughout the normal caliber small bowel, which could reflect sequela of gastroenteritis. No bowel wall thickening or inflammatory change. Normal retrocecal appendix. Vascular/Lymphatic: Aortic atherosclerosis. No enlarged abdominal or pelvic lymph nodes. Reproductive: Uterus and bilateral adnexa are unremarkable. Other: No free fluid or free intraperitoneal gas. There is a small right para umbilical Richter type hernia, containing a portion of the ventral wall of the mid transverse colon. Abdominal wall defect measures 2.6 x 2.2 cm. No evidence of incarceration or bowel wall ischemia. Musculoskeletal: No acute or destructive bony abnormalities. Stable appearance of the chronic inferior endplate compression deformity at L1. Stable diffuse multilevel lumbar spondylosis and facet hypertrophy as previously outlined on CT lumbar spine performed earlier today. Reconstructed images demonstrate no additional findings. IMPRESSION: 1. Scattered gas fluid levels throughout the nondistended small bowel, which may reflect sequela of gastroenteritis. No evidence of high-grade bowel obstruction. 2. Cholelithiasis without cholecystitis. 3. Indeterminate 1.4 cm slightly hyperdense right renal lesion, favor hyperdense cyst given the lack of significant change since 2017. Simple cyst left kidney. No specific imaging follow-up recommended. 4. Small right para umbilical Richter hernia containing a portion of the anti mesenteric wall of the mid transverse colon. No evidence of bowel incarceration or ischemia. 5.  Aortic Atherosclerosis (ICD10-I70.0). 6. Chronic L1 compression deformity and severe multilevel lumbar spondylosis and facet hypertrophy. Please see  preceding CT lumbar spine for full description of findings. Electronically Signed   By: Sharlet Salina M.D.   On: 05/12/2023 19:44   DG Chest Portable 1 View  Result Date: 05/12/2023 CLINICAL DATA:  Sepsis EXAM: PORTABLE CHEST 1 VIEW COMPARISON:  04/20/2021 FINDINGS: Left-sided single lead pacing device as before. Hypoventilatory changes. Cardiomegaly with vascular congestion and mild interstitial edema. No focal consolidation, pleural effusion or pneumothorax. IMPRESSION: Cardiomegaly with vascular congestion and mild interstitial edema. Electronically Signed   By: Jasmine Pang M.D.   On: 05/12/2023 19:01   CT Lumbar Spine Wo Contrast  Result Date: 05/12/2023 CLINICAL DATA:  Lumbar compression fracture EXAM: CT LUMBAR SPINE WITHOUT CONTRAST TECHNIQUE: Multidetector CT imaging of the lumbar spine was performed without intravenous contrast administration. Multiplanar CT image reconstructions were also generated. RADIATION DOSE REDUCTION: This exam was performed according to the departmental dose-optimization program which includes automated exposure control, adjustment of the mA and/or kV according to patient size and/or use of iterative reconstruction technique. COMPARISON:  12/02/2022 CT scan FINDINGS: Segmentation: The lowest lumbar type non-rib-bearing vertebra is labeled as L5. Alignment: No vertebral subluxation is observed. Vertebrae: 50% inferior endplate compression fracture at L1 with 1-2 mm of posterior bony retropulsion. This fracture was also visible on 12/02/2022. Minimal sclerosis along the compression fracture margin. No substantial increase in collapse compared to the prior exam. 3 mm degenerative retrolisthesis L2-3 and L4-5. Intervertebral vacuum disc phenomenon at all levels between T11 and S1, although only minimally at T12-L1. Loss of disc height at all levels between T11 and S1 except for T12-L1 and L1-2. New or acute compression fracture is observed. Paraspinal and other soft  tissues: Atherosclerosis is present, including aortoiliac atherosclerotic disease. 1.6 cm in short axis left adrenal mass, internal density 1.8 Hounsfield units, compatible with adenoma. No further imaging workup of this lesion is indicated.  Disc levels: T12-L1: Unremarkable L1-2: Moderate central narrowing of the thecal sac due to posterior bony retropulsion, intervertebral spurring, and disc bulge. L2-3: Prominent central narrowing of the thecal sac and at least mild bilateral subarticular lateral recess stenosis due to disc osteophyte complex, short pedicles, and facet arthropathy. L3-4: Prominent central narrowing of the thecal sac with mild bilateral foraminal stenosis and at least moderate bilateral subarticular lateral recess stenosis due to disc osteophyte complex, facet arthropathy, and short pedicles. L4-5: Moderate to prominent central narrowing of the thecal sac with mild right and borderline left foraminal stenosis due to facet arthropathy, disc osteophyte complex, and short pedicles. Possible right laminectomy at this level in past. L5-S1: Prominent right and mild left foraminal stenosis and at least mild central narrowing of the thecal sac due to prominent right facet spurring and short pedicles. IMPRESSION: 1. 50% inferior endplate compression fracture at L1 with 1-2 mm of posterior bony retropulsion. This fracture was also visible on 12/02/2022. No substantial increase in collapse compared to the prior exam. 2. Lumbar spondylosis, congenitally short pedicles, and degenerative disc disease, causing prominent impingement at L2-3, L3-4, and L5-S1; moderate to prominent impingement at L4-5; and moderate impingement at L1-2. 3. Prominent right foraminal stenosis at L5-S1. 4. Aortic atherosclerosis. 5. 1.6 cm in short axis left adrenal adenoma. No further imaging workup of this lesion is indicated. Aortic Atherosclerosis (ICD10-I70.0). Electronically Signed   By: Gaylyn Rong M.D.   On: 05/12/2023  16:06    EKG: Independently reviewed.  Sinus bradycardia with first-degree AV block, RBBB, QTc 527.  No significant change since previous tracing.  Assessment and Plan  Severe sepsis Acute on chronic back pain Patient with history of chronic back pain presenting with worsening mid/low back pain for the past 3 days.  Met criteria for severe sepsis at the time of presentation with hypotension, leukocytosis, and lactic acidosis. CT of lumbar spine/abdomen/pelvis negative for acute findings. MRI of thoracic and lumbar spine ordered to rule out discitis/osteomyelitis but cannot be done overnight due to presence of ICD. Patient does not have any red flag symptoms concerning for cauda equina syndrome or cord compression at this time.  Continue broad-spectrum antibiotics including vancomycin, cefepime, and metronidazole.  Patient was given 2.75 L IV fluids in the ED.  Chest x-ray is concerning for mild pulmonary edema.  Since blood pressure has now improved, will stop IV fluids.  Trend WBC count and lactate.  Blood cultures pending.  Back pain has improved with Tylenol.  LFTs were normal on labs done earlier this month, continue scheduled Tylenol 1000 mg every 6 hours.  Best to avoid opiates as patient experienced nausea/vomiting and dizziness after taking oral morphine at home.  Continue home gabapentin.  Keep n.p.o. except sips with meds until MRI is done.  Acute on chronic HFpEF Patient received IV fluid boluses in the ED per sepsis protocol.  Chest x-ray is concerning for mild pulmonary edema. Last echo done in August 2023 showing EF 60 to 65%, grade 2 diastolic dysfunction, normal RV systolic function.  IV fluids now discontinued.  Patient was given IV Lasix 20 mg in the ED.  Currently satting well on 2 L [place], no respiratory distress.  Check BNP.  History of VT status post ICD Continue amiodarone and mexiletine.  Paroxysmal A-fib/flutter Currently in sinus rhythm and heart rate in the 80s.  Hold Eliquis  until MRI is done.  Continue antiarrhythmics.  Resume metoprolol in the morning if blood pressure remains stable.  Mild hypokalemia  QT prolongation Replace potassium and check magnesium level.  Continue home antiarrhythmics at this time and avoid any other QT prolonging drugs/QT prolonging antiemetics.  CAD Patient is not endorsing any anginal symptoms. Resume metoprolol in the morning if blood pressure remains stable.  COPD Stable, no signs of acute exacerbation.  Not on inhalers at home.  OSA Continue CPAP at night.  CKD stage IIIb Creatinine currently 1.6, baseline 1.3-1.5.  Continue to monitor renal function.  Hypothyroidism Continue Synthroid  Hyperlipidemia Continue Tricor, Vascepa, and Livalo.  Insulin-dependent type 2 diabetes Glucose 220.  Last A1c 6.0 in June 2023, repeat ordered.  Sensitive sliding scale insulin every 4 hours for now as patient is NPO.  Continue home basal insulin at 1/2 of usual dose.  DVT prophylaxis: SCDs Code Status: DNR (discussed with the patient) Family Communication: Daughter at bedside. Level of care: Progressive Care Unit Admission status: It is my clinical opinion that admission to INPATIENT is reasonable and necessary because of the expectation that this patient will require hospital care that crosses at least 2 midnights to treat this condition based on the medical complexity of the problems presented.  Given the aforementioned information, the predictability of an adverse outcome is felt to be significant.  John Giovanni MD [facility]  If 7PM-7AM, please contact night-coverage [email or web address]  05/13/2023, 1:09 AM

## 2023-05-13 NOTE — ED Notes (Signed)
ED TO INPATIENT HANDOFF REPORT  ED Nurse Name and Phone #: Vernona Rieger [phone]  S Name/Age/Gender Heidi Stark 82 y.o. female Room/Bed: 002C/002C  Code Status   Code Status: Do not attempt resuscitation (DNR) PRE-ARREST INTERVENTIONS DESIRED  Home/SNF/Other Home Patient oriented to: self, place, time, and situation Is this baseline? Yes   Triage Complete: Triage complete  Chief Complaint Severe sepsis (HCC) [A41.9, R65.20]  Triage Note Patient BIB EMS from home with complaints of back pain. Noted in lower right side. While patient was at home she took 7.5mg  of morphine at 9am and then at 12 noon she took another 15mg  of morphine. It was noted that this medication is not prescribed to her. She is now complaining of nausea and dizziness since taking medication. She is noted to be hypotensive with a blood pressure of 86/49. She does have pacemaker. And EKG changes were noted by EMS.    Allergies Allergies  Allergen Reactions   Neomycin-Bacitracin Zn-Polymyx Rash   Sulfonamide Derivatives Other (See Comments)    Stomach cramps   Sulfa Antibiotics Other (See Comments)   Ciprofloxacin Hives, Itching and Rash    Pt doesn't remember having any reaction to cipro   Spironolactone Rash    Level of Care/Admitting Diagnosis ED Disposition     ED Disposition  Admit   Condition  --   Comment  Hospital Area: [facility] [[id number]]  Level of Care: Progressive [102]  Admit to Progressive based on following criteria: MULTISYSTEM THREATS such as stable sepsis, metabolic/electrolyte imbalance with or without encephalopathy that is responding to early treatment.  May admit patient to [facility] or [facility] if equivalent level of care is available:: Yes  Covid Evaluation: Asymptomatic - no recent exposure (last 10 days) testing not required  Diagnosis: Severe sepsis [facility]) [[id number]]  Admitting Physician: John Giovanni [[id number]]  Attending Physician: John Giovanni  [[id number]]  Certification:: I certify this patient will need inpatient services for at least 2 midnights  Expected Medical Readiness: 05/15/2023          B Medical/Surgery History Past Medical History:  Diagnosis Date   Arthritis    "hands, wrists" (11/07/2016)   CHF (congestive heart failure) (HCC)    Chronic lower back pain    Constipation    DVT (deep venous thrombosis) (HCC)    History of blood transfusion 1990   "related to OR"   History of kidney stones    Hyperlipidemia    Hypertension    Joint pain    Lower extremity edema    OSA on CPAP    Persistent atrial fibrillation (HCC)    Pneumonia    "several times" (11/07/2016)   Type II diabetes mellitus (HCC)    Past Surgical History:  Procedure Laterality Date   ATRIAL FIBRILLATION ABLATION N/A 11/07/2016   Procedure: Atrial Fibrillation Ablation;  Surgeon: Will Jorja Loa, MD;  Location: [facility] INVASIVE CV LAB;  Service: Cardiovascular;  Laterality: N/A;   BACK SURGERY     CARDIAC CATHETERIZATION     CARDIOVERSION N/A 08/11/2016   Procedure: CARDIOVERSION;  Surgeon: Laurey Morale, MD;  Location: [facility] ENDOSCOPY;  Service: Cardiovascular;  Laterality: N/A;   CARDIOVERSION N/A 12/03/2017   Procedure: CARDIOVERSION;  Surgeon: Laurey Morale, MD;  Location: [facility] ENDOSCOPY;  Service: Cardiovascular;  Laterality: N/A;   CARDIOVERSION N/A 05/02/2018   Procedure: CARDIOVERSION;  Surgeon: Chrystie Nose, MD;  Location: [facility] ENDOSCOPY;  Service: Cardiovascular;  Laterality: N/A;   CARDIOVERSION N/A 06/23/2020  Procedure: CARDIOVERSION;  Surgeon: Meriam Sprague, MD;  Location: [facility] ENDOSCOPY;  Service: Cardiovascular;  Laterality: N/A;   CARDIOVERSION N/A 10/11/2020   Procedure: CARDIOVERSION;  Surgeon: Laurey Morale, MD;  Location: [facility] ENDOSCOPY;  Service: Cardiovascular;  Laterality: N/A;   CARPAL TUNNEL RELEASE     CATARACT EXTRACTION W/ INTRAOCULAR LENS  IMPLANT, BILATERAL Bilateral    COLON SURGERY  1990   vein graft  and colon repair after nicked artery with back surgert   COLONOSCOPY     CORONARY ANGIOGRAPHY N/A 09/08/2020   Procedure: CORONARY ANGIOGRAPHY;  Surgeon: Laurey Morale, MD;  Location: [facility] INVASIVE CV LAB;  Service: Cardiovascular;  Laterality: N/A;   CYSTECTOMY     between bladder and kidneys   GANGLION CYST EXCISION Left    ICD IMPLANT N/A 09/15/2020   Procedure: ICD IMPLANT;  Surgeon: Regan Lemming, MD;  Location: [facility] INVASIVE CV LAB;  Service: Cardiovascular;  Laterality: N/A;   KNEE CARTILAGE SURGERY Right 1960s   LUMBAR DISC SURGERY  1990   REPAIR ILIAC ARTERY  1990   vein graft and colon repair after nicked artery with back surgert   TEE WITHOUT CARDIOVERSION N/A 10/31/2013   Procedure: TRANSESOPHAGEAL ECHOCARDIOGRAM (TEE);  Surgeon: Laurey Morale, MD;  Location: [facility] ENDOSCOPY;  Service: Cardiovascular;  Laterality: N/A;   TUBAL LIGATION       A IV Location/Drains/Wounds Patient Lines/Drains/Airways Status     Active Line/Drains/Airways     Name Placement date Placement time Site Days   Peripheral IV 05/12/23 20 G Anterior;Left;Proximal Forearm 05/12/23  --  Forearm  1   Peripheral IV 05/12/23 Anterior;Distal;Right Forearm 05/12/23  1612  Forearm  1   Wound / Incision (Open or Dehisced) 09/08/20 Breast Lower;Right;Left redness(MASD) 09/08/20  1504  Breast  977   Wound / Incision (Open or Dehisced) 09/15/20 Other (Comment) Chest Lateral;Left;Upper Inicison for ICD placement 09/15/20  1816  Chest  970   Wound / Incision (Open or Dehisced) 04/16/21 Non-pressure wound Thigh Left;Posterior;Proximal Hardened raised area with crater, small area of base is open. 04/16/21  0045  Thigh  757            Intake/Output Last 24 hours  Intake/Output Summary (Last 24 hours) at 05/13/2023 1146 Last data filed at 05/13/2023 0653 Gross per 24 hour  Intake 200 ml  Output --  Net 200 ml    Labs/Imaging Results for orders placed or performed during the hospital encounter of  05/12/23 (from the past 48 hour(s))  CBC     Status: Abnormal   Collection Time: 05/12/23  3:35 PM  Result Value Ref Range   WBC 22.2 (H) 4.0 - 10.5 K/uL   RBC 5.36 (H) 3.87 - 5.11 MIL/uL   Hemoglobin 16.2 (H) 12.0 - 15.0 g/dL   HCT 09.6 (H) 04.5 - 40.9 %   MCV 93.8 80.0 - 100.0 fL   MCH 30.2 26.0 - 34.0 pg   MCHC 32.2 30.0 - 36.0 g/dL   RDW 81.1 (H) 91.4 - 78.2 %   Platelets 178 150 - 400 K/uL   nRBC 0.0 0.0 - 0.2 %    Comment: Performed at [facility], [address], [place], [place] [address]  Basic metabolic panel     Status: Abnormal   Collection Time: 05/12/23  3:35 PM  Result Value Ref Range   Sodium 136 135 - 145 mmol/L   Potassium 3.4 (L) 3.5 - 5.1 mmol/L   Chloride 98 98 - 111  mmol/L   CO2 23 22 - 32 mmol/L   Glucose, Bld 220 (H) 70 - 99 mg/dL    Comment: Glucose reference range applies only to samples taken after fasting for at least 8 hours.   BUN 17 8 - 23 mg/dL   Creatinine, Ser 1.61 (H) 0.44 - 1.00 mg/dL   Calcium 8.4 (L) 8.9 - 10.3 mg/dL   GFR, Estimated 31 (L) >60 mL/min    Comment: (NOTE) Calculated using the CKD-EPI Creatinine Equation (2021)    Anion gap 15 5 - 15    Comment: Performed at [facility], [address] [place], [place] [address]  I-Stat CG4 Lactic Acid     Status: Abnormal   Collection Time: 05/12/23  3:55 PM  Result Value Ref Range   Lactic Acid, Venous 2.6 (HH) 0.5 - 1.9 mmol/L   Comment NOTIFIED PHYSICIAN   Culture, blood (single)     Status: None (Preliminary result)   Collection Time: 05/12/23  3:57 PM   Specimen: BLOOD  Result Value Ref Range   Specimen Description BLOOD SITE NOT SPECIFIED    Special Requests      BOTTLES DRAWN AEROBIC AND ANAEROBIC Blood Culture adequate volume   Culture      NO GROWTH < 24 HOURS Performed at [facility], [address], [place], [place] [address]    Report Status PENDING   Culture, blood (single)     Status: None (Preliminary result)   Collection Time: 05/12/23  4:16  PM   Specimen: BLOOD  Result Value Ref Range   Specimen Description BLOOD SITE NOT SPECIFIED    Special Requests      BOTTLES DRAWN AEROBIC AND ANAEROBIC Blood Culture adequate volume   Culture      NO GROWTH < 24 HOURS Performed at [facility], [address] [place], [place] [address]    Report Status PENDING   Urinalysis, Routine w reflex microscopic -Urine, Clean Catch     Status: Abnormal   Collection Time: 05/12/23  8:16 PM  Result Value Ref Range   Color, Urine YELLOW YELLOW   APPearance CLEAR CLEAR   Specific Gravity, Urine 1.011 1.005 - 1.030   pH 6.0 5.0 - 8.0   Glucose, UA >=500 (A) NEGATIVE mg/dL   Hgb urine dipstick NEGATIVE NEGATIVE   Bilirubin Urine NEGATIVE NEGATIVE   Ketones, ur NEGATIVE NEGATIVE mg/dL   Protein, ur NEGATIVE NEGATIVE mg/dL   Nitrite NEGATIVE NEGATIVE   Leukocytes,Ua NEGATIVE NEGATIVE   RBC / HPF 0-5 0 - 5 RBC/hpf   WBC, UA 0-5 0 - 5 WBC/hpf   Bacteria, UA NONE SEEN NONE SEEN   Squamous Epithelial / HPF 0-5 0 - 5 /HPF   Mucus PRESENT    Hyaline Casts, UA PRESENT     Comment: Performed at [facility], [address] [place], [place] [address]  I-Stat CG4 Lactic Acid     Status: Abnormal   Collection Time: 05/12/23  9:25 PM  Result Value Ref Range   Lactic Acid, Venous 2.7 (HH) 0.5 - 1.9 mmol/L   Comment NOTIFIED PHYSICIAN   CBG monitoring, ED     Status: Abnormal   Collection Time: 05/13/23  1:09 AM  Result Value Ref Range   Glucose-Capillary 212 (H) 70 - 99 mg/dL    Comment: Glucose reference range applies only to samples taken after fasting for at least 8 hours.  CBC     Status: Abnormal  Collection Time: 05/13/23  2:46 AM  Result Value Ref Range   WBC 14.7 (H) 4.0 - 10.5 K/uL   RBC 5.40 (H) 3.87 - 5.11 MIL/uL   Hemoglobin 16.7 (H) 12.0 - 15.0 g/dL   HCT 08.6 (H) 57.8 - 46.9 %   MCV 91.5 80.0 - 100.0 fL   MCH 30.9 26.0 - 34.0 pg   MCHC 33.8 30.0 - 36.0 g/dL   RDW 62.9 (H) 52.8 - 41.3 %   Platelets 159 150 - 400  K/uL   nRBC 0.0 0.0 - 0.2 %    Comment: Performed at [facility], [address], [place], [place] [address]  Comprehensive metabolic panel     Status: Abnormal   Collection Time: 05/13/23  2:46 AM  Result Value Ref Range   Sodium 132 (L) 135 - 145 mmol/L   Potassium 3.8 3.5 - 5.1 mmol/L   Chloride 98 98 - 111 mmol/L   CO2 20 (L) 22 - 32 mmol/L   Glucose, Bld 207 (H) 70 - 99 mg/dL    Comment: Glucose reference range applies only to samples taken after fasting for at least 8 hours.   BUN 21 8 - 23 mg/dL   Creatinine, Ser 0.27 (H) 0.44 - 1.00 mg/dL   Calcium 7.7 (L) 8.9 - 10.3 mg/dL   Total Protein 6.4 (L) 6.5 - 8.1 g/dL   Albumin 3.0 (L) 3.5 - 5.0 g/dL   AST 57 (H) 15 - 41 U/L   ALT 30 0 - 44 U/L   Alkaline Phosphatase 42 38 - 126 U/L   Total Bilirubin 1.1 0.3 - 1.2 mg/dL   GFR, Estimated 31 (L) >60 mL/min    Comment: (NOTE) Calculated using the CKD-EPI Creatinine Equation (2021)    Anion gap 14 5 - 15    Comment: Performed at [facility], [address] [place], [place] [address]  Brain natriuretic peptide     Status: Abnormal   Collection Time: 05/13/23  2:46 AM  Result Value Ref Range   B Natriuretic Peptide 274.7 (H) 0.0 - 100.0 pg/mL    Comment: Performed at [facility], [address], [place], [place] [address]  Magnesium     Status: None   Collection Time: 05/13/23  2:46 AM  Result Value Ref Range   Magnesium 1.8 1.7 - 2.4 mg/dL    Comment: Performed at [facility], [address] [place], [place] [address]  Hemoglobin A1c     Status: Abnormal   Collection Time: 05/13/23  2:46 AM  Result Value Ref Range   Hgb A1c MFr Bld 6.7 (H) 4.8 - 5.6 %    Comment: (NOTE) Pre diabetes:          5.7%-6.4%  Diabetes:              >6.4%  Glycemic control for   <7.0% adults with diabetes    Mean Plasma Glucose 145.59 mg/dL    Comment: Performed at [facility], [address] [place], [place] [address]  Lactic acid, plasma     Status:  Abnormal   Collection Time: 05/13/23  2:46 AM  Result Value Ref Range   Lactic Acid, Venous 2.3 (HH) 0.5 - 1.9 mmol/L    Comment: CRITICAL RESULT CALLED TO, READ BACK BY AND VERIFIED WITH WOODY, A. RN @ 0400 05/13/23 JBUTLER Performed at [facility], [address], [place], [place] [address]   CBG monitoring, ED  Status: Abnormal   Collection Time: 05/13/23  4:23 AM  Result Value Ref Range   Glucose-Capillary 216 (H) 70 - 99 mg/dL    Comment: Glucose reference range applies only to samples taken after fasting for at least 8 hours.  CBG monitoring, ED     Status: Abnormal   Collection Time: 05/13/23  8:05 AM  Result Value Ref Range   Glucose-Capillary 191 (H) 70 - 99 mg/dL    Comment: Glucose reference range applies only to samples taken after fasting for at least 8 hours.   CT ABDOMEN PELVIS WO CONTRAST  Result Date: 05/12/2023 CLINICAL DATA:  Acute nonlocalized abdominal pain, known lumbar spine fracture EXAM: CT ABDOMEN AND PELVIS WITHOUT CONTRAST TECHNIQUE: Multidetector CT imaging of the abdomen and pelvis was performed following the standard protocol without IV contrast. RADIATION DOSE REDUCTION: This exam was performed according to the departmental dose-optimization program which includes automated exposure control, adjustment of the mA and/or kV according to patient size and/or use of iterative reconstruction technique. COMPARISON:  05/12/2023 at 2:55 p.m., 06/12/2016 FINDINGS: Lower chest: Scattered bilateral lower lobe scarring or atelectasis. No acute airspace disease. Mild cardiomegaly, with calcification of the mitral and aortic valves. No pericardial effusion. Hepatobiliary: Small calcified gallstones without evidence of acute cholecystitis. Unremarkable unenhanced appearance of the liver. No biliary ductal dilation or evidence of choledocholithiasis. Pancreas: Unremarkable unenhanced appearance. Spleen: Unremarkable unenhanced appearance. Adrenals/Urinary Tract: No  urinary tract calculi or obstructive uropathy within either kidney. 1.6 cm simple appearing cyst left kidney does not require specific follow-up. 1.4 cm slightly hyperdense lesion within the lateral mid right kidney reference image 26/3, measures 38 HU. This was previously evaluated in 2017, where it measured 1.3 cm demonstrating equivocal enhancement on that exam. Given the lack of significant interval increase in size, this likely reflects a hyperdense cyst. Stable left adrenal adenoma does not require imaging follow-up. The right adrenals unremarkable. The bladder is grossly normal. Stomach/Bowel: No evidence of high-grade bowel obstruction. Scattered gas fluid levels are seen throughout the normal caliber small bowel, which could reflect sequela of gastroenteritis. No bowel wall thickening or inflammatory change. Normal retrocecal appendix. Vascular/Lymphatic: Aortic atherosclerosis. No enlarged abdominal or pelvic lymph nodes. Reproductive: Uterus and bilateral adnexa are unremarkable. Other: No free fluid or free intraperitoneal gas. There is a small right para umbilical Richter type hernia, containing a portion of the ventral wall of the mid transverse colon. Abdominal wall defect measures 2.6 x 2.2 cm. No evidence of incarceration or bowel wall ischemia. Musculoskeletal: No acute or destructive bony abnormalities. Stable appearance of the chronic inferior endplate compression deformity at L1. Stable diffuse multilevel lumbar spondylosis and facet hypertrophy as previously outlined on CT lumbar spine performed earlier today. Reconstructed images demonstrate no additional findings. IMPRESSION: 1. Scattered gas fluid levels throughout the nondistended small bowel, which may reflect sequela of gastroenteritis. No evidence of high-grade bowel obstruction. 2. Cholelithiasis without cholecystitis. 3. Indeterminate 1.4 cm slightly hyperdense right renal lesion, favor hyperdense cyst given the lack of significant  change since 2017. Simple cyst left kidney. No specific imaging follow-up recommended. 4. Small right para umbilical Richter hernia containing a portion of the anti mesenteric wall of the mid transverse colon. No evidence of bowel incarceration or ischemia. 5.  Aortic Atherosclerosis (ICD10-I70.0). 6. Chronic L1 compression deformity and severe multilevel lumbar spondylosis and facet hypertrophy. Please see preceding CT lumbar spine for full description of findings. Electronically Signed   By: Sharlet Salina M.D.   On: 05/12/2023 19:44  DG Chest Portable 1 View  Result Date: 05/12/2023 CLINICAL DATA:  Sepsis EXAM: PORTABLE CHEST 1 VIEW COMPARISON:  04/20/2021 FINDINGS: Left-sided single lead pacing device as before. Hypoventilatory changes. Cardiomegaly with vascular congestion and mild interstitial edema. No focal consolidation, pleural effusion or pneumothorax. IMPRESSION: Cardiomegaly with vascular congestion and mild interstitial edema. Electronically Signed   By: Jasmine Pang M.D.   On: 05/12/2023 19:01   CT Lumbar Spine Wo Contrast  Result Date: 05/12/2023 CLINICAL DATA:  Lumbar compression fracture EXAM: CT LUMBAR SPINE WITHOUT CONTRAST TECHNIQUE: Multidetector CT imaging of the lumbar spine was performed without intravenous contrast administration. Multiplanar CT image reconstructions were also generated. RADIATION DOSE REDUCTION: This exam was performed according to the departmental dose-optimization program which includes automated exposure control, adjustment of the mA and/or kV according to patient size and/or use of iterative reconstruction technique. COMPARISON:  12/02/2022 CT scan FINDINGS: Segmentation: The lowest lumbar type non-rib-bearing vertebra is labeled as L5. Alignment: No vertebral subluxation is observed. Vertebrae: 50% inferior endplate compression fracture at L1 with 1-2 mm of posterior bony retropulsion. This fracture was also visible on 12/02/2022. Minimal sclerosis along  the compression fracture margin. No substantial increase in collapse compared to the prior exam. 3 mm degenerative retrolisthesis L2-3 and L4-5. Intervertebral vacuum disc phenomenon at all levels between T11 and S1, although only minimally at T12-L1. Loss of disc height at all levels between T11 and S1 except for T12-L1 and L1-2. New or acute compression fracture is observed. Paraspinal and other soft tissues: Atherosclerosis is present, including aortoiliac atherosclerotic disease. 1.6 cm in short axis left adrenal mass, internal density 1.8 Hounsfield units, compatible with adenoma. No further imaging workup of this lesion is indicated. Disc levels: T12-L1: Unremarkable L1-2: Moderate central narrowing of the thecal sac due to posterior bony retropulsion, intervertebral spurring, and disc bulge. L2-3: Prominent central narrowing of the thecal sac and at least mild bilateral subarticular lateral recess stenosis due to disc osteophyte complex, short pedicles, and facet arthropathy. L3-4: Prominent central narrowing of the thecal sac with mild bilateral foraminal stenosis and at least moderate bilateral subarticular lateral recess stenosis due to disc osteophyte complex, facet arthropathy, and short pedicles. L4-5: Moderate to prominent central narrowing of the thecal sac with mild right and borderline left foraminal stenosis due to facet arthropathy, disc osteophyte complex, and short pedicles. Possible right laminectomy at this level in past. L5-S1: Prominent right and mild left foraminal stenosis and at least mild central narrowing of the thecal sac due to prominent right facet spurring and short pedicles. IMPRESSION: 1. 50% inferior endplate compression fracture at L1 with 1-2 mm of posterior bony retropulsion. This fracture was also visible on 12/02/2022. No substantial increase in collapse compared to the prior exam. 2. Lumbar spondylosis, congenitally short pedicles, and degenerative disc disease, causing  prominent impingement at L2-3, L3-4, and L5-S1; moderate to prominent impingement at L4-5; and moderate impingement at L1-2. 3. Prominent right foraminal stenosis at L5-S1. 4. Aortic atherosclerosis. 5. 1.6 cm in short axis left adrenal adenoma. No further imaging workup of this lesion is indicated. Aortic Atherosclerosis (ICD10-I70.0). Electronically Signed   By: Gaylyn Rong M.D.   On: 05/12/2023 16:06    Pending Labs Unresulted Labs (From admission, onward)     Start     Ordered   05/14/23 0500  CBC  Daily,   R      05/13/23 1003   05/14/23 0500  Comprehensive metabolic panel  Daily,   R  05/13/23 1003   05/14/23 0500  Magnesium  Tomorrow morning,   R        05/13/23 1003   05/14/23 0500  Procalcitonin  Tomorrow morning,   R       References:    Procalcitonin Lower Respiratory Tract Infection AND Sepsis Procalcitonin Algorithm   05/13/23 1003   05/13/23 1003  Procalcitonin  Add-on,   AD       References:    Procalcitonin Lower Respiratory Tract Infection AND Sepsis Procalcitonin Algorithm   05/13/23 1003            Vitals/Pain Today's Vitals   05/13/23 0720 05/13/23 1111 05/13/23 1137 05/13/23 1138  BP: 110/62  (!) 119/57   Pulse: 91  82   Resp: (!) 29  (!) 21   Temp: 98.2 F (36.8 C)   97.7 F (36.5 C)  TempSrc: Oral   Oral  SpO2: 97%  100%   Weight:      Height:      PainSc:  4       Isolation Precautions No active isolations  Medications Medications  scopolamine (TRANSDERM-SCOP) 1 MG/3DAYS 1.5 mg (1.5 mg Transdermal Patch Applied 05/12/23 1756)  acetaminophen (TYLENOL) tablet 1,000 mg (1,000 mg Oral Given 05/13/23 1002)  trimethobenzamide (TIGAN) injection 200 mg (200 mg Intramuscular Given 05/13/23 0523)  amiodarone (PACERONE) tablet 200 mg (200 mg Oral Given 05/13/23 1002)  fenofibrate tablet 160 mg (has no administration in time range)  icosapent Ethyl (VASCEPA) 1 g capsule 2 g (2 g Oral Given 05/13/23 1002)  pravastatin (PRAVACHOL) tablet 80 mg  (has no administration in time range)  mexiletine (MEXITIL) capsule 300 mg (300 mg Oral Given 05/13/23 1002)  levothyroxine (SYNTHROID) tablet 25 mcg (has no administration in time range)  gabapentin (NEURONTIN) capsule 300 mg (300 mg Oral Given 05/13/23 1002)  insulin aspart (novoLOG) injection 0-9 Units (2 Units Subcutaneous Given 05/13/23 1003)  metroNIDAZOLE (FLAGYL) IVPB 500 mg (0 mg Intravenous Stopped 05/13/23 0653)  ceFEPIme (MAXIPIME) 2 g in sodium chloride 0.9 % 100 mL IVPB (0 g Intravenous Stopped 05/13/23 0653)  vancomycin (VANCOREADY) IVPB 750 mg/150 mL (has no administration in time range)  insulin glargine-yfgn (SEMGLEE) injection 10 Units (10 Units Subcutaneous Given 05/13/23 0524)  LORazepam (ATIVAN) tablet 0.5 mg (has no administration in time range)  metoprolol succinate (TOPROL-XL) 24 hr tablet 25 mg (has no administration in time range)  apixaban (ELIQUIS) tablet 5 mg (5 mg Oral Given 05/13/23 1057)  albuterol (PROVENTIL) (2.5 MG/3ML) 0.083% nebulizer solution 2.5 mg (has no administration in time range)  trimethobenzamide (TIGAN) injection 100 mg (100 mg Intramuscular Given 05/12/23 1548)  sodium chloride 0.9 % bolus 500 mL (500 mLs Intravenous Bolus 05/12/23 1545)  lactated ringers bolus 1,000 mL (0 mLs Intravenous Stopped 05/12/23 1710)    And  lactated ringers bolus 1,000 mL (0 mLs Intravenous Stopped 05/12/23 1824)    And  lactated ringers bolus 250 mL (0 mLs Intravenous Stopped 05/12/23 1824)  ceFEPIme (MAXIPIME) 2 g in sodium chloride 0.9 % 100 mL IVPB (0 g Intravenous Stopped 05/12/23 1655)  metroNIDAZOLE (FLAGYL) IVPB 500 mg (0 mg Intravenous Stopped 05/12/23 1811)  vancomycin (VANCOCIN) IVPB 1000 mg/200 mL premix (0 mg Intravenous Stopped 05/12/23 1725)    Followed by  vancomycin (VANCOCIN) IVPB 1000 mg/200 mL premix (0 mg Intravenous Stopped 05/12/23 2020)  furosemide (LASIX) injection 20 mg (20 mg Intravenous Given 05/12/23 2131)  hyaluronidase Human  (HYLENEX) injection 150 Units (150 Units Subcutaneous Given 05/13/23  0030)  potassium chloride SA (KLOR-CON M) CR tablet 40 mEq (40 mEq Oral Given 05/13/23 0411)    Mobility power wheelchair     Focused Assessments Cardiac Assessment Handoff:  Cardiac Rhythm: Sinus bradycardia Lab Results  Component Value Date   CKTOTAL 182 (H) 11/18/2009   Lab Results  Component Value Date   DDIMER 0.37 04/19/2021   Does the Patient currently have chest pain? No    R Recommendations: See Admitting Provider Note  Report given to:   Additional Notes: Stand and pivot

## 2023-05-13 NOTE — Progress Notes (Signed)
Pharmacy Antibiotic Note  Heidi Stark is a 82 y.o. female admitted on 05/12/2023 with complaints of back pain, nausea (given use of morphine) with concerns for sepsis.  Pharmacy has been consulted for cefepime and vancomycin dosing.  WBC 22.2, sCr 1.67, lactate 2.7, afebrile Received 1 dose of cefepime, vanc and flagyl in ED Blood cultures collected  Plan: -Cefepime 2g IV every 12 hours -Vancomycin 2000mg  IV x1 in ED -Vancomycin 750mg  IV every 24 hours (AUC 520, Vd 0.5 , IBW) -Flagyl 500mg  IV every 12 hours per MD -Monitor renal function - Follow up signs of clinical improvement, LOT, de-escalation   Temp (24hrs), Avg:97.9 F (36.6 C), Min:97.3 F (36.3 C), Max:98.7 F (37.1 C)  Recent Labs  Lab 05/12/23 1535 05/12/23 1555 05/12/23 2125  WBC 22.2*  --   --   CREATININE 1.67*  --   --   LATICACIDVEN  --  2.6* 2.7*    CrCl cannot be calculated (Unknown ideal weight.).    Allergies  Allergen Reactions   Neomycin-Bacitracin Zn-Polymyx Rash   Sulfonamide Derivatives Other (See Comments)    Stomach cramps   Sulfa Antibiotics Other (See Comments)   Ciprofloxacin Hives, Itching and Rash    Pt doesn't remember having any reaction to cipro   Spironolactone Rash    Antimicrobials this admission: Cefepime 10/26 >>  Vancomycin 10/26 >>  Flagyl 10/26 >>  Microbiology results: 10/26 BCx:   Thank you for allowing pharmacy to be a part of this patient's care.  Arabella Merles, PharmD. Clinical Pharmacist 05/13/2023 2:47 AM

## 2023-05-13 NOTE — Progress Notes (Addendum)
PROGRESS NOTE    Heidi Stark  BTD:[id number] DOB: March 31, 1941 DOA: 05/12/2023 PCP: Donato Schultz, DO    Brief Narrative:   Heidi Stark is a 82 y.o. 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  Patient reports over the last 3 days worsening pain to her mid to lower back on the right side.  Denies any trauma, falls or injuries.  History of chronic back pain related to ruptured disks which she required surgery in the 1990s.  Due to having worsening pain, patient reportedly took her daughter's morphine pills, took 7.5 mg around 9 AM and then another 15 mg of morphine around noon.  Following use of the pain medication, patient started having dizziness, nausea/vomiting.  Patient denies any saddle anesthesia, no bowel/bladder dysfunction, no lower extremity weakness or numbness.  Denies fevers, no chest pain, no shortness of breath, no abdominal pain.  Given her symptoms this prompted her to seek further care in the ED.  In the ED, temperature 97.6 F, HR 55, RR 22, BP 67/41, SpO2 95% on room air.  WBC 22.2, hemoglobin 16.2, platelet count 178.  Sodium 136, potassium 3.4, chloride 98, CO2 23, glucose 220, BUN 19, creat 1.67.  Lactic acid 2.6.  Urinalysis with greater than 500 glucose, otherwise negative.  CT L-spine with 50% inferior endplate compression fracture L1 with 1-2 mm posterior bony retropulsion, visible on 12/02/2022, lumbar spondylosis, DDD causing prominent pinching L2-3, L3-4 and L5-S1, moderate prominent impingement L4-5 and moderate impingement L1-2, prominent right foraminal  stenosis L5-S1, aortic atherosclerosis, 1.6 cm left adrenal adenoma.  Chest x-ray with cardiomegaly, vascular congestion and mild interstitial edema.  CT abdomen/pelvis without contrast with scattered gas/fluid levels throughout the nondistended small bowel, no evidence of high-grade bowel obstruction, cholelithiasis without cholecystitis, indeterminate 1.4 cm hypodense right renal lesion stable since 2017, simple cyst left kidney, small right periumbilical hernia containing mesenteric wall of the mid transverse colon, no evidence of bowel incarceration or ischemia, aortic atherosclerosis, chronic L1 compression deformity and severe multilevel lumbar spondylosis and facet hypertrophy.  Blood cultures x 2 obtained.   MRI of thoracic and lumbar spine ordered to rule out discitis/osteomyelitis but cannot be done overnight due to presence of ICD.  Patient was given IV Lasix 20 mg, Tigan, Tylenol, 2.75 L IV fluid boluses, and IV antibiotics including vancomycin, cefepime, and Flagyl in the ED.  Blood pressure improved.  TRH consulted for admission for further evaluation and management of severe sepsis of unclear etiology, acute on chronic back pain.  Assessment & Plan:   Severe sepsis Acute on chronic back pain Patient with history of chronic back pain presenting with worsening mid/low back pain for the past 3 days.  Met criteria for severe sepsis at the time of presentation with hypotension, leukocytosis, and lactic acidosis. CT of lumbar spine/abdomen/pelvis negative for acute findings.  Chest x-ray with no focal consolidation.  Patient does not have any red flag symptoms concerning for cauda equina syndrome or cord compression at this time.  Patient was given 2.75 L IV fluid resuscitation in the ED. -- WBC 22.2>14.7 -- Blood cultures x 2: pending -- Vancomycin, pharmacy  consulted for dosing/monitoring -- Cefepime 2 g IV every 8 hours -- Metronidazole 5 mg IV every 12 hours -- Gabapentin 300 mg p.o. daily  as needed pain -- Tylenol 1 g p.o. every 6 hours -- MRI of thoracic and lumbar spine: pending; delayed due to presence of ICD; per MRI tech plan for tomorrow   Acute on chronic HFpEF NICM Patient received IV fluid boluses in the ED per sepsis protocol.  Chest x-ray is concerning for mild pulmonary edema. Last echo done in August 2023 showing EF 60 to 65%, grade 2 diastolic dysfunction, normal RV systolic function. Patient was given IV Lasix 20 mg in the ED. BNP 274.7.  Home regimen includes metoprolol succinate 25 mg p.o. nightly, furosemide 80 mg p.o. twice daily, eplerenone 25 mg p.o. daily, Entresto 24-26 mg twice daily. -- Continue supplemental oxygen, maintain SpO2 > 92%, wean as able -- Continue metoprolol succinate 25 mg p.o. nightly -- Hold home Entresto, furosemide, eplerenone due to hypotension on admission -- Strict I's and O's and daily weights   History of VT status post ICD Patient underwent ICD placement by Dr. Elberta Fortis on 09/15/2020 with [employer], model [id number] (serial number [id number]) right ventricular defibrillator lead, Med[employer]onic Visia AF MRI VR SureScan serial Number [id number]) ICD.  -- Continue amiodarone and mexiletine.   Paroxysmal A-fib/flutter Currently in sinus rhythm and heart rate in the 80s.   -- Amiodarone 200 mg p.o. daily -- Mexiletine -- Eliquis 5mg  PO BID   Mild hypokalemia QT prolongation Potassium repleted.  -- Continue home antiarrhythmics -- avoid any other QT prolonging drugs/QT prolonging antiemetics. -- BMP daily   CAD -- Continue statin   COPD Stable, no signs of acute exacerbation.  Not on inhalers at home. -- Albuterol neb as needed shortness of breath/wheezing   OSA -- Continue CPAP at night.   CKD stage IIIb Baseline creatinine 1.3-1.5.   -- Cr 1.67>1.65 -- BMP daily   Hypothyroidism -- Levothyroxine 25 mcg 3 times weekly on M/W/F   Hyperlipidemia -- Pravastatin 80 mg p.o. daily -- Vascepa 2 g p.o. twice daily --  Fenofibrate 160 mg p.o. daily   Insulin-dependent type 2 diabetes, with hyperglycemia Hemoglobin A1c 6.0 in June 2023.  Home medications include Farxiga, Humalog 75-25 30u BID. -- Semglee 10 units [place] daily -- SSI for coverage -- CBGs qAC/HS  Morbid obesity Body mass index is 41.16 kg/m.  Complicates all facets of care   DVT prophylaxis: SCDs Start: 05/13/23 0231    Code Status: Do not attempt resuscitation (DNR) PRE-ARREST INTERVENTIONS DESIRED Family Communication: No family present at bedside this morning  Disposition Plan:  Level of care: Progressive Status is: Inpatient Remains inpatient appropriate because: IV antibiotics, pending MR T/L-spine    Consultants:  None  Procedures:  None  Antimicrobials:  Vancomycin 10/26>> Cefepime 10/26>> Metronidazole 10/26>>   Subjective: Patient seen examined bedside, resting calmly.  Remains in ED holding area.  Upset that she remains in the ED and wants to know when she will "get a bed".  Also angry that apparently her "bed was given away to another patient".  Continues to endorse low back pain, localized to the right side of her spine that is constant.  Only improved with rest.  Unable to get comfortable and "tired of asking the nurses to change the angle of her bed inclined".  Started on IV antibiotics for concern of possible deep infection of her spine, WBC count improved this morning.  No other specific questions or concerns at  this time.  Denies headache, no visual changes, no chest pain, no palpitations, no shortness of breath, no cough/congestion, no abdominal pain, no fever/chills/night sweats, no nausea/vomiting/diarrhea, no focal weakness, no fatigue, no paresthesias, no urinary symptoms.  No acute events overnight per nursing staff.  Objective: Vitals:   05/13/23 0230 05/13/23 0300 05/13/23 0630 05/13/23 0720  BP: 120/77 122/61  110/62  Pulse: 83 84 89 91  Resp: (!) 23 12 (!) 27 (!) 29  Temp:  97.9 F (36.6 C)  98.2 F  (36.8 C)  TempSrc:  Oral  Oral  SpO2: 98% 98% 100% 97%  Weight:      Height:        Intake/Output Summary (Last 24 hours) at 05/13/2023 0934 Last data filed at 05/13/2023 1610 Gross per 24 hour  Intake 200 ml  Output --  Net 200 ml   Filed Weights   05/13/23 0200  Weight: 115.7 kg    Examination:  Physical Exam: GEN: NAD, alert and oriented x 3, wd/wn HEENT: NCAT, PERRL, EOMI, sclera clear, MMM PULM: CTAB w/o wheezes/crackles, normal respiratory effort CV: RRR w/o M/G/R GI: abd soft, NTND, NABS, no R/G/M MSK: no peripheral edema, muscle strength globally intact 5/5 bilateral upper/lower extremities NEURO: CN II-XII intact, no focal deficits, sensation to light touch intact PSYCH: normal mood/affect Integumentary: dry/intact, no rashes or wounds    Data Reviewed: I have personally reviewed following labs and imaging studies  CBC: Recent Labs  Lab 05/12/23 1535 05/13/23 0246  WBC 22.2* 14.7*  HGB 16.2* 16.7*  HCT 50.3* 49.4*  MCV 93.8 91.5  PLT 178 159   Basic Metabolic Panel: Recent Labs  Lab 05/12/23 1535 05/13/23 0246  NA 136 132*  K 3.4* 3.8  CL 98 98  CO2 23 20*  GLUCOSE 220* 207*  BUN 17 21  CREATININE 1.67* 1.65*  CALCIUM 8.4* 7.7*  MG  --  1.8   GFR: Estimated Creatinine Clearance: 34.6 mL/min (A) (by C-G formula based on SCr of 1.65 mg/dL (H)). Liver Function Tests: Recent Labs  Lab 05/13/23 0246  AST 57*  ALT 30  ALKPHOS 42  BILITOT 1.1  PROT 6.4*  ALBUMIN 3.0*   No results for input(s): "LIPASE", "AMYLASE" in the last 168 hours. No results for input(s): "AMMONIA" in the last 168 hours. Coagulation Profile: No results for input(s): "INR", "PROTIME" in the last 168 hours. Cardiac Enzymes: No results for input(s): "CKTOTAL", "CKMB", "CKMBINDEX", "TROPONINI" in the last 168 hours. BNP (last 3 results) No results for input(s): "PROBNP" in the last 8760 hours. HbA1C: Recent Labs    05/13/23 0246  HGBA1C 6.7*   CBG: Recent  Labs  Lab 05/13/23 0109 05/13/23 0423 05/13/23 0805  GLUCAP 212* 216* 191*   Lipid Profile: No results for input(s): "CHOL", "HDL", "LDLCALC", "TRIG", "CHOLHDL", "LDLDIRECT" in the last 72 hours. Thyroid Function Tests: No results for input(s): "TSH", "T4TOTAL", "FREET4", "T3FREE", "THYROIDAB" in the last 72 hours. Anemia Panel: No results for input(s): "VITAMINB12", "FOLATE", "FERRITIN", "TIBC", "IRON", "RETICCTPCT" in the last 72 hours. Sepsis Labs: Recent Labs  Lab 05/12/23 1555 05/12/23 2125 05/13/23 0246  LATICACIDVEN 2.6* 2.7* 2.3*    Recent Results (from the past 240 hour(s))  Culture, blood (single)     Status: None (Preliminary result)   Collection Time: 05/12/23  3:57 PM   Specimen: BLOOD  Result Value Ref Range Status   Specimen Description BLOOD SITE NOT SPECIFIED  Final   Special Requests   Final    BOTTLES  DRAWN AEROBIC AND ANAEROBIC Blood Culture adequate volume   Culture   Final    NO GROWTH < 24 HOURS Performed at [facility], [address], [place], [place] [address]    Report Status PENDING  Incomplete  Culture, blood (single)     Status: None (Preliminary result)   Collection Time: 05/12/23  4:16 PM   Specimen: BLOOD  Result Value Ref Range Status   Specimen Description BLOOD SITE NOT SPECIFIED  Final   Special Requests   Final    BOTTLES DRAWN AEROBIC AND ANAEROBIC Blood Culture adequate volume   Culture   Final    NO GROWTH < 24 HOURS Performed at [facility], [address] [place], [place] [address]    Report Status PENDING  Incomplete         Radiology Studies: CT ABDOMEN PELVIS WO CONTRAST  Result Date: 05/12/2023 CLINICAL DATA:  Acute nonlocalized abdominal pain, known lumbar spine fracture EXAM: CT ABDOMEN AND PELVIS WITHOUT CONTRAST TECHNIQUE: Multidetector CT imaging of the abdomen and pelvis was performed following the standard protocol without IV contrast. RADIATION DOSE REDUCTION: This exam was performed  according to the departmental dose-optimization program which includes automated exposure control, adjustment of the mA and/or kV according to patient size and/or use of iterative reconstruction technique. COMPARISON:  05/12/2023 at 2:55 p.m., 06/12/2016 FINDINGS: Lower chest: Scattered bilateral lower lobe scarring or atelectasis. No acute airspace disease. Mild cardiomegaly, with calcification of the mitral and aortic valves. No pericardial effusion. Hepatobiliary: Small calcified gallstones without evidence of acute cholecystitis. Unremarkable unenhanced appearance of the liver. No biliary ductal dilation or evidence of choledocholithiasis. Pancreas: Unremarkable unenhanced appearance. Spleen: Unremarkable unenhanced appearance. Adrenals/Urinary Tract: No urinary tract calculi or obstructive uropathy within either kidney. 1.6 cm simple appearing cyst left kidney does not require specific follow-up. 1.4 cm slightly hyperdense lesion within the lateral mid right kidney reference image 26/3, measures 38 HU. This was previously evaluated in 2017, where it measured 1.3 cm demonstrating equivocal enhancement on that exam. Given the lack of significant interval increase in size, this likely reflects a hyperdense cyst. Stable left adrenal adenoma does not require imaging follow-up. The right adrenals unremarkable. The bladder is grossly normal. Stomach/Bowel: No evidence of high-grade bowel obstruction. Scattered gas fluid levels are seen throughout the normal caliber small bowel, which could reflect sequela of gastroenteritis. No bowel wall thickening or inflammatory change. Normal retrocecal appendix. Vascular/Lymphatic: Aortic atherosclerosis. No enlarged abdominal or pelvic lymph nodes. Reproductive: Uterus and bilateral adnexa are unremarkable. Other: No free fluid or free intraperitoneal gas. There is a small right para umbilical Richter type hernia, containing a portion of the ventral wall of the mid transverse  colon. Abdominal wall defect measures 2.6 x 2.2 cm. No evidence of incarceration or bowel wall ischemia. Musculoskeletal: No acute or destructive bony abnormalities. Stable appearance of the chronic inferior endplate compression deformity at L1. Stable diffuse multilevel lumbar spondylosis and facet hypertrophy as previously outlined on CT lumbar spine performed earlier today. Reconstructed images demonstrate no additional findings. IMPRESSION: 1. Scattered gas fluid levels throughout the nondistended small bowel, which may reflect sequela of gastroenteritis. No evidence of high-grade bowel obstruction. 2. Cholelithiasis without cholecystitis. 3. Indeterminate 1.4 cm slightly hyperdense right renal lesion, favor hyperdense cyst given the lack of significant change since 2017. Simple cyst left kidney. No specific imaging follow-up recommended. 4. Small right para umbilical Richter hernia containing a portion of the anti mesenteric wall of the mid transverse colon.  No evidence of bowel incarceration or ischemia. 5.  Aortic Atherosclerosis (ICD10-I70.0). 6. Chronic L1 compression deformity and severe multilevel lumbar spondylosis and facet hypertrophy. Please see preceding CT lumbar spine for full description of findings. Electronically Signed   By: Sharlet Salina M.D.   On: 05/12/2023 19:44   DG Chest Portable 1 View  Result Date: 05/12/2023 CLINICAL DATA:  Sepsis EXAM: PORTABLE CHEST 1 VIEW COMPARISON:  04/20/2021 FINDINGS: Left-sided single lead pacing device as before. Hypoventilatory changes. Cardiomegaly with vascular congestion and mild interstitial edema. No focal consolidation, pleural effusion or pneumothorax. IMPRESSION: Cardiomegaly with vascular congestion and mild interstitial edema. Electronically Signed   By: Jasmine Pang M.D.   On: 05/12/2023 19:01   CT Lumbar Spine Wo Contrast  Result Date: 05/12/2023 CLINICAL DATA:  Lumbar compression fracture EXAM: CT LUMBAR SPINE WITHOUT CONTRAST  TECHNIQUE: Multidetector CT imaging of the lumbar spine was performed without intravenous contrast administration. Multiplanar CT image reconstructions were also generated. RADIATION DOSE REDUCTION: This exam was performed according to the departmental dose-optimization program which includes automated exposure control, adjustment of the mA and/or kV according to patient size and/or use of iterative reconstruction technique. COMPARISON:  12/02/2022 CT scan FINDINGS: Segmentation: The lowest lumbar type non-rib-bearing vertebra is labeled as L5. Alignment: No vertebral subluxation is observed. Vertebrae: 50% inferior endplate compression fracture at L1 with 1-2 mm of posterior bony retropulsion. This fracture was also visible on 12/02/2022. Minimal sclerosis along the compression fracture margin. No substantial increase in collapse compared to the prior exam. 3 mm degenerative retrolisthesis L2-3 and L4-5. Intervertebral vacuum disc phenomenon at all levels between T11 and S1, although only minimally at T12-L1. Loss of disc height at all levels between T11 and S1 except for T12-L1 and L1-2. New or acute compression fracture is observed. Paraspinal and other soft tissues: Atherosclerosis is present, including aortoiliac atherosclerotic disease. 1.6 cm in short axis left adrenal mass, internal density 1.8 Hounsfield units, compatible with adenoma. No further imaging workup of this lesion is indicated. Disc levels: T12-L1: Unremarkable L1-2: Moderate central narrowing of the thecal sac due to posterior bony retropulsion, intervertebral spurring, and disc bulge. L2-3: Prominent central narrowing of the thecal sac and at least mild bilateral subarticular lateral recess stenosis due to disc osteophyte complex, short pedicles, and facet arthropathy. L3-4: Prominent central narrowing of the thecal sac with mild bilateral foraminal stenosis and at least moderate bilateral subarticular lateral recess stenosis due to disc  osteophyte complex, facet arthropathy, and short pedicles. L4-5: Moderate to prominent central narrowing of the thecal sac with mild right and borderline left foraminal stenosis due to facet arthropathy, disc osteophyte complex, and short pedicles. Possible right laminectomy at this level in past. L5-S1: Prominent right and mild left foraminal stenosis and at least mild central narrowing of the thecal sac due to prominent right facet spurring and short pedicles. IMPRESSION: 1. 50% inferior endplate compression fracture at L1 with 1-2 mm of posterior bony retropulsion. This fracture was also visible on 12/02/2022. No substantial increase in collapse compared to the prior exam. 2. Lumbar spondylosis, congenitally short pedicles, and degenerative disc disease, causing prominent impingement at L2-3, L3-4, and L5-S1; moderate to prominent impingement at L4-5; and moderate impingement at L1-2. 3. Prominent right foraminal stenosis at L5-S1. 4. Aortic atherosclerosis. 5. 1.6 cm in short axis left adrenal adenoma. No further imaging workup of this lesion is indicated. Aortic Atherosclerosis (ICD10-I70.0). Electronically Signed   By: Gaylyn Rong M.D.   On: 05/12/2023 16:06  Scheduled Meds:  acetaminophen  1,000 mg Oral Q6H   amiodarone  200 mg Oral Daily   fenofibrate  160 mg Oral Daily   icosapent Ethyl  2 g Oral BID   insulin aspart  0-9 Units Subcutaneous Q4H   insulin glargine-yfgn  10 Units Subcutaneous Daily   [START ON 05/14/2023] levothyroxine  25 mcg Oral Once per day on Monday Wednesday Friday   mexiletine  300 mg Oral BID   pravastatin  80 mg Oral q1800   scopolamine  1 patch Transdermal Q72H   Continuous Infusions:  ceFEPime (MAXIPIME) IV Stopped (05/13/23 4696)   metronidazole Stopped (05/13/23 0653)   vancomycin       LOS: 0 days    Time spent: 52 minutes spent on chart review, discussion with nursing staff, consultants, updating family and interview/physical exam; more  than 50% of that time was spent in counseling and/or coordination of care.    Alvira Philips [place], DO [facility] Available via Epic secure chat 7am-7pm After these hours, please refer to coverage provider listed on [email or web address] 05/13/2023, 9:34 AM

## 2023-05-13 NOTE — ED Notes (Signed)
ED TO INPATIENT HANDOFF REPORT  ED Nurse Name and Phone #: Noemi Chapel  S Name/Age/Gender Heidi Stark 82 y.o. female Room/Bed: 002C/002C  Code Status   Code Status: Prior  Home/SNF/Other Home Patient oriented to: self, place, time, and situation Is this baseline? Yes   Triage Complete: Triage complete  Chief Complaint Severe sepsis (HCC) [A41.9, R65.20]  Triage Note Patient BIB EMS from home with complaints of back pain. Noted in lower right side. While patient was at home she took 7.5mg  of morphine at 9am and then at 12 noon she took another 15mg  of morphine. It was noted that this medication is not prescribed to her. She is now complaining of nausea and dizziness since taking medication. She is noted to be hypotensive with a blood pressure of 86/49. She does have pacemaker. And EKG changes were noted by EMS.    Allergies Allergies  Allergen Reactions   Neomycin-Bacitracin Zn-Polymyx Rash   Sulfonamide Derivatives Other (See Comments)    Stomach cramps   Sulfa Antibiotics Other (See Comments)   Ciprofloxacin Hives, Itching and Rash    Pt doesn't remember having any reaction to cipro   Spironolactone Rash    Level of Care/Admitting Diagnosis ED Disposition     ED Disposition  Admit   Condition  --   Comment  Hospital Area: [facility] [[id number]]  Level of Care: Progressive [102]  Admit to Progressive based on following criteria: MULTISYSTEM THREATS such as stable sepsis, metabolic/electrolyte imbalance with or without encephalopathy that is responding to early treatment.  May admit patient to [facility] or [facility] if equivalent level of care is available:: Yes  Covid Evaluation: Asymptomatic - no recent exposure (last 10 days) testing not required  Diagnosis: Severe sepsis [facility]) [[id number]]  Admitting Physician: John Giovanni [[id number]]  Attending Physician: John Giovanni [[id number]]  Certification:: I certify this patient will need  inpatient services for at least 2 midnights  Expected Medical Readiness: 05/15/2023          B Medical/Surgery History Past Medical History:  Diagnosis Date   Arthritis    "hands, wrists" (11/07/2016)   CHF (congestive heart failure) (HCC)    Chronic lower back pain    Constipation    DVT (deep venous thrombosis) (HCC)    History of blood transfusion 1990   "related to OR"   History of kidney stones    Hyperlipidemia    Hypertension    Joint pain    Lower extremity edema    OSA on CPAP    Persistent atrial fibrillation (HCC)    Pneumonia    "several times" (11/07/2016)   Type II diabetes mellitus (HCC)    Past Surgical History:  Procedure Laterality Date   ATRIAL FIBRILLATION ABLATION N/A 11/07/2016   Procedure: Atrial Fibrillation Ablation;  Surgeon: Will Jorja Loa, MD;  Location: [facility] INVASIVE CV LAB;  Service: Cardiovascular;  Laterality: N/A;   BACK SURGERY     CARDIAC CATHETERIZATION     CARDIOVERSION N/A 08/11/2016   Procedure: CARDIOVERSION;  Surgeon: Laurey Morale, MD;  Location: [facility] ENDOSCOPY;  Service: Cardiovascular;  Laterality: N/A;   CARDIOVERSION N/A 12/03/2017   Procedure: CARDIOVERSION;  Surgeon: Laurey Morale, MD;  Location: [facility] ENDOSCOPY;  Service: Cardiovascular;  Laterality: N/A;   CARDIOVERSION N/A 05/02/2018   Procedure: CARDIOVERSION;  Surgeon: Chrystie Nose, MD;  Location: [facility] ENDOSCOPY;  Service: Cardiovascular;  Laterality: N/A;   CARDIOVERSION N/A 06/23/2020   Procedure: CARDIOVERSION;  Surgeon: Laurance Flatten  E, MD;  Location: [facility] ENDOSCOPY;  Service: Cardiovascular;  Laterality: N/A;   CARDIOVERSION N/A 10/11/2020   Procedure: CARDIOVERSION;  Surgeon: Laurey Morale, MD;  Location: [facility] ENDOSCOPY;  Service: Cardiovascular;  Laterality: N/A;   CARPAL TUNNEL RELEASE     CATARACT EXTRACTION W/ INTRAOCULAR LENS  IMPLANT, BILATERAL Bilateral    COLON SURGERY  1990   vein graft and colon repair after nicked artery with back surgert    COLONOSCOPY     CORONARY ANGIOGRAPHY N/A 09/08/2020   Procedure: CORONARY ANGIOGRAPHY;  Surgeon: Laurey Morale, MD;  Location: [facility] INVASIVE CV LAB;  Service: Cardiovascular;  Laterality: N/A;   CYSTECTOMY     between bladder and kidneys   GANGLION CYST EXCISION Left    ICD IMPLANT N/A 09/15/2020   Procedure: ICD IMPLANT;  Surgeon: Regan Lemming, MD;  Location: [facility] INVASIVE CV LAB;  Service: Cardiovascular;  Laterality: N/A;   KNEE CARTILAGE SURGERY Right 1960s   LUMBAR DISC SURGERY  1990   REPAIR ILIAC ARTERY  1990   vein graft and colon repair after nicked artery with back surgert   TEE WITHOUT CARDIOVERSION N/A 10/31/2013   Procedure: TRANSESOPHAGEAL ECHOCARDIOGRAM (TEE);  Surgeon: Laurey Morale, MD;  Location: [facility] ENDOSCOPY;  Service: Cardiovascular;  Laterality: N/A;   TUBAL LIGATION       A IV Location/Drains/Wounds Patient Lines/Drains/Airways Status     Active Line/Drains/Airways     Name Placement date Placement time Site Days   Peripheral IV 05/12/23 20 G Anterior;Left;Proximal Forearm 05/12/23  --  Forearm  1   Peripheral IV 05/12/23 Anterior;Distal;Right Forearm 05/12/23  1612  Forearm  1   Wound / Incision (Open or Dehisced) 09/08/20 Breast Lower;Right;Left redness(MASD) 09/08/20  1504  Breast  977   Wound / Incision (Open or Dehisced) 09/15/20 Other (Comment) Chest Lateral;Left;Upper Inicison for ICD placement 09/15/20  1816  Chest  970   Wound / Incision (Open or Dehisced) 04/16/21 Non-pressure wound Thigh Left;Posterior;Proximal Hardened raised area with crater, small area of base is open. 04/16/21  0045  Thigh  757            Intake/Output Last 24 hours No intake or output data in the 24 hours ending 05/13/23 0136  Labs/Imaging Results for orders placed or performed during the hospital encounter of 05/12/23 (from the past 48 hour(s))  CBC     Status: Abnormal   Collection Time: 05/12/23  3:35 PM  Result Value Ref Range   WBC 22.2 (H) 4.0 - 10.5 K/uL    RBC 5.36 (H) 3.87 - 5.11 MIL/uL   Hemoglobin 16.2 (H) 12.0 - 15.0 g/dL   HCT 40.3 (H) 47.4 - 25.9 %   MCV 93.8 80.0 - 100.0 fL   MCH 30.2 26.0 - 34.0 pg   MCHC 32.2 30.0 - 36.0 g/dL   RDW 56.3 (H) 87.5 - 64.3 %   Platelets 178 150 - 400 K/uL   nRBC 0.0 0.0 - 0.2 %    Comment: Performed at [facility], [address] [place], [place] [address]  Basic metabolic panel     Status: Abnormal   Collection Time: 05/12/23  3:35 PM  Result Value Ref Range   Sodium 136 135 - 145 mmol/L   Potassium 3.4 (L) 3.5 - 5.1 mmol/L   Chloride 98 98 - 111 mmol/L   CO2 23 22 - 32 mmol/L   Glucose, Bld 220 (H) 70 - 99 mg/dL    Comment: Glucose reference range  applies only to samples taken after fasting for at least 8 hours.   BUN 17 8 - 23 mg/dL   Creatinine, Ser 1.32 (H) 0.44 - 1.00 mg/dL   Calcium 8.4 (L) 8.9 - 10.3 mg/dL   GFR, Estimated 31 (L) >60 mL/min    Comment: (NOTE) Calculated using the CKD-EPI Creatinine Equation (2021)    Anion gap 15 5 - 15    Comment: Performed at [facility], [address] [place], [place] [address]  I-Stat CG4 Lactic Acid     Status: Abnormal   Collection Time: 05/12/23  3:55 PM  Result Value Ref Range   Lactic Acid, Venous 2.6 (HH) 0.5 - 1.9 mmol/L   Comment NOTIFIED PHYSICIAN   Urinalysis, Routine w reflex microscopic -Urine, Clean Catch     Status: Abnormal   Collection Time: 05/12/23  8:16 PM  Result Value Ref Range   Color, Urine YELLOW YELLOW   APPearance CLEAR CLEAR   Specific Gravity, Urine 1.011 1.005 - 1.030   pH 6.0 5.0 - 8.0   Glucose, UA >=500 (A) NEGATIVE mg/dL   Hgb urine dipstick NEGATIVE NEGATIVE   Bilirubin Urine NEGATIVE NEGATIVE   Ketones, ur NEGATIVE NEGATIVE mg/dL   Protein, ur NEGATIVE NEGATIVE mg/dL   Nitrite NEGATIVE NEGATIVE   Leukocytes,Ua NEGATIVE NEGATIVE   RBC / HPF 0-5 0 - 5 RBC/hpf   WBC, UA 0-5 0 - 5 WBC/hpf   Bacteria, UA NONE SEEN NONE SEEN   Squamous Epithelial / HPF 0-5 0 - 5 /HPF   Mucus PRESENT     Hyaline Casts, UA PRESENT     Comment: Performed at [facility], [address], [place], [place] [address]  I-Stat CG4 Lactic Acid     Status: Abnormal   Collection Time: 05/12/23  9:25 PM  Result Value Ref Range   Lactic Acid, Venous 2.7 (HH) 0.5 - 1.9 mmol/L   Comment NOTIFIED PHYSICIAN   CBG monitoring, ED     Status: Abnormal   Collection Time: 05/13/23  1:09 AM  Result Value Ref Range   Glucose-Capillary 212 (H) 70 - 99 mg/dL    Comment: Glucose reference range applies only to samples taken after fasting for at least 8 hours.   CT ABDOMEN PELVIS WO CONTRAST  Result Date: 05/12/2023 CLINICAL DATA:  Acute nonlocalized abdominal pain, known lumbar spine fracture EXAM: CT ABDOMEN AND PELVIS WITHOUT CONTRAST TECHNIQUE: Multidetector CT imaging of the abdomen and pelvis was performed following the standard protocol without IV contrast. RADIATION DOSE REDUCTION: This exam was performed according to the departmental dose-optimization program which includes automated exposure control, adjustment of the mA and/or kV according to patient size and/or use of iterative reconstruction technique. COMPARISON:  05/12/2023 at 2:55 p.m., 06/12/2016 FINDINGS: Lower chest: Scattered bilateral lower lobe scarring or atelectasis. No acute airspace disease. Mild cardiomegaly, with calcification of the mitral and aortic valves. No pericardial effusion. Hepatobiliary: Small calcified gallstones without evidence of acute cholecystitis. Unremarkable unenhanced appearance of the liver. No biliary ductal dilation or evidence of choledocholithiasis. Pancreas: Unremarkable unenhanced appearance. Spleen: Unremarkable unenhanced appearance. Adrenals/Urinary Tract: No urinary tract calculi or obstructive uropathy within either kidney. 1.6 cm simple appearing cyst left kidney does not require specific follow-up. 1.4 cm slightly hyperdense lesion within the lateral mid right kidney reference image 26/3, measures 38 HU. This  was previously evaluated in 2017, where it measured 1.3 cm demonstrating equivocal enhancement on that exam. Given the lack of significant interval increase in size,  this likely reflects a hyperdense cyst. Stable left adrenal adenoma does not require imaging follow-up. The right adrenals unremarkable. The bladder is grossly normal. Stomach/Bowel: No evidence of high-grade bowel obstruction. Scattered gas fluid levels are seen throughout the normal caliber small bowel, which could reflect sequela of gastroenteritis. No bowel wall thickening or inflammatory change. Normal retrocecal appendix. Vascular/Lymphatic: Aortic atherosclerosis. No enlarged abdominal or pelvic lymph nodes. Reproductive: Uterus and bilateral adnexa are unremarkable. Other: No free fluid or free intraperitoneal gas. There is a small right para umbilical Richter type hernia, containing a portion of the ventral wall of the mid transverse colon. Abdominal wall defect measures 2.6 x 2.2 cm. No evidence of incarceration or bowel wall ischemia. Musculoskeletal: No acute or destructive bony abnormalities. Stable appearance of the chronic inferior endplate compression deformity at L1. Stable diffuse multilevel lumbar spondylosis and facet hypertrophy as previously outlined on CT lumbar spine performed earlier today. Reconstructed images demonstrate no additional findings. IMPRESSION: 1. Scattered gas fluid levels throughout the nondistended small bowel, which may reflect sequela of gastroenteritis. No evidence of high-grade bowel obstruction. 2. Cholelithiasis without cholecystitis. 3. Indeterminate 1.4 cm slightly hyperdense right renal lesion, favor hyperdense cyst given the lack of significant change since 2017. Simple cyst left kidney. No specific imaging follow-up recommended. 4. Small right para umbilical Richter hernia containing a portion of the anti mesenteric wall of the mid transverse colon. No evidence of bowel incarceration or ischemia. 5.   Aortic Atherosclerosis (ICD10-I70.0). 6. Chronic L1 compression deformity and severe multilevel lumbar spondylosis and facet hypertrophy. Please see preceding CT lumbar spine for full description of findings. Electronically Signed   By: Sharlet Salina M.D.   On: 05/12/2023 19:44   DG Chest Portable 1 View  Result Date: 05/12/2023 CLINICAL DATA:  Sepsis EXAM: PORTABLE CHEST 1 VIEW COMPARISON:  04/20/2021 FINDINGS: Left-sided single lead pacing device as before. Hypoventilatory changes. Cardiomegaly with vascular congestion and mild interstitial edema. No focal consolidation, pleural effusion or pneumothorax. IMPRESSION: Cardiomegaly with vascular congestion and mild interstitial edema. Electronically Signed   By: Jasmine Pang M.D.   On: 05/12/2023 19:01   CT Lumbar Spine Wo Contrast  Result Date: 05/12/2023 CLINICAL DATA:  Lumbar compression fracture EXAM: CT LUMBAR SPINE WITHOUT CONTRAST TECHNIQUE: Multidetector CT imaging of the lumbar spine was performed without intravenous contrast administration. Multiplanar CT image reconstructions were also generated. RADIATION DOSE REDUCTION: This exam was performed according to the departmental dose-optimization program which includes automated exposure control, adjustment of the mA and/or kV according to patient size and/or use of iterative reconstruction technique. COMPARISON:  12/02/2022 CT scan FINDINGS: Segmentation: The lowest lumbar type non-rib-bearing vertebra is labeled as L5. Alignment: No vertebral subluxation is observed. Vertebrae: 50% inferior endplate compression fracture at L1 with 1-2 mm of posterior bony retropulsion. This fracture was also visible on 12/02/2022. Minimal sclerosis along the compression fracture margin. No substantial increase in collapse compared to the prior exam. 3 mm degenerative retrolisthesis L2-3 and L4-5. Intervertebral vacuum disc phenomenon at all levels between T11 and S1, although only minimally at T12-L1. Loss of  disc height at all levels between T11 and S1 except for T12-L1 and L1-2. New or acute compression fracture is observed. Paraspinal and other soft tissues: Atherosclerosis is present, including aortoiliac atherosclerotic disease. 1.6 cm in short axis left adrenal mass, internal density 1.8 Hounsfield units, compatible with adenoma. No further imaging workup of this lesion is indicated. Disc levels: T12-L1: Unremarkable L1-2: Moderate central narrowing of the thecal sac due to  posterior bony retropulsion, intervertebral spurring, and disc bulge. L2-3: Prominent central narrowing of the thecal sac and at least mild bilateral subarticular lateral recess stenosis due to disc osteophyte complex, short pedicles, and facet arthropathy. L3-4: Prominent central narrowing of the thecal sac with mild bilateral foraminal stenosis and at least moderate bilateral subarticular lateral recess stenosis due to disc osteophyte complex, facet arthropathy, and short pedicles. L4-5: Moderate to prominent central narrowing of the thecal sac with mild right and borderline left foraminal stenosis due to facet arthropathy, disc osteophyte complex, and short pedicles. Possible right laminectomy at this level in past. L5-S1: Prominent right and mild left foraminal stenosis and at least mild central narrowing of the thecal sac due to prominent right facet spurring and short pedicles. IMPRESSION: 1. 50% inferior endplate compression fracture at L1 with 1-2 mm of posterior bony retropulsion. This fracture was also visible on 12/02/2022. No substantial increase in collapse compared to the prior exam. 2. Lumbar spondylosis, congenitally short pedicles, and degenerative disc disease, causing prominent impingement at L2-3, L3-4, and L5-S1; moderate to prominent impingement at L4-5; and moderate impingement at L1-2. 3. Prominent right foraminal stenosis at L5-S1. 4. Aortic atherosclerosis. 5. 1.6 cm in short axis left adrenal adenoma. No further  imaging workup of this lesion is indicated. Aortic Atherosclerosis (ICD10-I70.0). Electronically Signed   By: Gaylyn Rong M.D.   On: 05/12/2023 16:06    Pending Labs Unresulted Labs (From admission, onward)     Start     Ordered   05/12/23 1611  Culture, blood (single)  Once,   STAT        05/12/23 1610   05/12/23 1557  Culture, blood (single)  (Undifferentiated -> Now sepsis confirmed (treatment and sepsis specific nursing orders))  ONCE - STAT,   STAT        05/12/23 1558            Vitals/Pain Today's Vitals   05/12/23 2117 05/12/23 2121 05/12/23 2315 05/13/23 0127  BP:  (!) 100/56 (!) 102/54   Pulse: 72 72 80   Resp: 20 (!) 22 (!) 24   Temp:  (!) 97.3 F (36.3 C)    TempSrc: Oral Oral    SpO2: 98% 97% 99%   PainSc:    1     Isolation Precautions No active isolations  Medications Medications  lactated ringers infusion (0 mLs Intravenous Stopped 05/12/23 2020)  scopolamine (TRANSDERM-SCOP) 1 MG/3DAYS 1.5 mg (1.5 mg Transdermal Patch Applied 05/12/23 1756)  acetaminophen (OFIRMEV) IV 1,000 mg (1,000 mg Intravenous New Bag/Given 05/13/23 0107)  LORazepam (ATIVAN) injection 0.5 mg (has no administration in time range)  trimethobenzamide (TIGAN) injection 100 mg (100 mg Intramuscular Given 05/12/23 1548)  sodium chloride 0.9 % bolus 500 mL (500 mLs Intravenous Bolus 05/12/23 1545)  lactated ringers bolus 1,000 mL (0 mLs Intravenous Stopped 05/12/23 1710)    And  lactated ringers bolus 1,000 mL (0 mLs Intravenous Stopped 05/12/23 1824)    And  lactated ringers bolus 250 mL (0 mLs Intravenous Stopped 05/12/23 1824)  ceFEPIme (MAXIPIME) 2 g in sodium chloride 0.9 % 100 mL IVPB (0 g Intravenous Stopped 05/12/23 1655)  metroNIDAZOLE (FLAGYL) IVPB 500 mg (0 mg Intravenous Stopped 05/12/23 1811)  vancomycin (VANCOCIN) IVPB 1000 mg/200 mL premix (0 mg Intravenous Stopped 05/12/23 1725)    Followed by  vancomycin (VANCOCIN) IVPB 1000 mg/200 mL premix (0 mg Intravenous  Stopped 05/12/23 2020)  furosemide (LASIX) injection 20 mg (20 mg Intravenous Given 05/12/23 2131)  hyaluronidase  Human (HYLENEX) injection 150 Units (150 Units Subcutaneous Given 05/13/23 0030)    Mobility walks with device     Focused Assessments Cardiac Assessment Handoff:  Cardiac Rhythm: Sinus bradycardia Lab Results  Component Value Date   CKTOTAL 182 (H) 11/18/2009   Lab Results  Component Value Date   DDIMER 0.37 04/19/2021   Does the Patient currently have chest pain? No   , Neuro Assessment Handoff:  Swallow screen pass? Yes  Cardiac Rhythm: Sinus bradycardia       Neuro Assessment: Within Defined Limits Neuro Checks:      Has TPA been given?  If patient is a Neuro Trauma and patient is going to OR before floor call report to 4N Charge nurse: [phone] or [phone]  , Renal Assessment Handoff:  Hemodialysis Schedule:  Last Hemodialysis date and time:    Restricted appendage: left arm has IV infiltration to left wrist. MD admin Hylenex to site.   , Pulmonary Assessment Handoff:  Lung sounds:   O2 Device: Nasal Cannula O2 Flow Rate (L/min): 2 L/min    R Recommendations: See Admitting Provider Note  Report given to:   Additional Notes: Pt very kind, pleasant, and interactive. Pt reports pain to lower back from previous surgery/injuries. All meds have been given and MRI postponed until am.

## 2023-05-13 NOTE — Progress Notes (Signed)
Patient placed herself on home CPAP for the night  

## 2023-05-13 NOTE — Progress Notes (Signed)
PIV consult: Large red/ purple bruise extends from wrist to proximal forearm.Pt attributes this to EMS venipuncture/PIV site, denies pain in the area. Infiltration R forearm, per RN/ tender.  New site obtained with US guidance just lateral to large bruise L forearm.  Please consider central line if pt will require prolonged infusions due to limited PIV access options.

## 2023-05-14 ENCOUNTER — Inpatient Hospital Stay (HOSPITAL_COMMUNITY): Payer: Medicare Other

## 2023-05-14 DIAGNOSIS — R6520 Severe sepsis without septic shock: Secondary | ICD-10-CM | POA: Diagnosis not present

## 2023-05-14 DIAGNOSIS — L03116 Cellulitis of left lower limb: Secondary | ICD-10-CM

## 2023-05-14 DIAGNOSIS — A419 Sepsis, unspecified organism: Secondary | ICD-10-CM | POA: Diagnosis not present

## 2023-05-14 DIAGNOSIS — M549 Dorsalgia, unspecified: Secondary | ICD-10-CM | POA: Diagnosis not present

## 2023-05-14 LAB — GLUCOSE, CAPILLARY
Glucose-Capillary: 138 mg/dL — ABNORMAL HIGH (ref 70–99)
Glucose-Capillary: 148 mg/dL — ABNORMAL HIGH (ref 70–99)
Glucose-Capillary: 166 mg/dL — ABNORMAL HIGH (ref 70–99)
Glucose-Capillary: 168 mg/dL — ABNORMAL HIGH (ref 70–99)
Glucose-Capillary: 177 mg/dL — ABNORMAL HIGH (ref 70–99)
Glucose-Capillary: 178 mg/dL — ABNORMAL HIGH (ref 70–99)

## 2023-05-14 LAB — COMPREHENSIVE METABOLIC PANEL
ALT: 27 U/L (ref 0–44)
AST: 50 U/L — ABNORMAL HIGH (ref 15–41)
Albumin: 2.5 g/dL — ABNORMAL LOW (ref 3.5–5.0)
Alkaline Phosphatase: 35 U/L — ABNORMAL LOW (ref 38–126)
Anion gap: 13 (ref 5–15)
BUN: 35 mg/dL — ABNORMAL HIGH (ref 8–23)
CO2: 19 mmol/L — ABNORMAL LOW (ref 22–32)
Calcium: 7.6 mg/dL — ABNORMAL LOW (ref 8.9–10.3)
Chloride: 99 mmol/L (ref 98–111)
Creatinine, Ser: 1.82 mg/dL — ABNORMAL HIGH (ref 0.44–1.00)
GFR, Estimated: 28 mL/min — ABNORMAL LOW (ref >60)
Glucose, Bld: 144 mg/dL — ABNORMAL HIGH (ref 70–99)
Potassium: 4.4 mmol/L (ref 3.5–5.1)
Sodium: 131 mmol/L — ABNORMAL LOW (ref 135–145)
Total Bilirubin: 0.9 mg/dL (ref 0.3–1.2)
Total Protein: 5.7 g/dL — ABNORMAL LOW (ref 6.5–8.1)

## 2023-05-14 LAB — PROCALCITONIN: Procalcitonin: 1.82 ng/mL

## 2023-05-14 LAB — CBC
HCT: 42.4 % (ref 36.0–46.0)
Hemoglobin: 13.7 g/dL (ref 12.0–15.0)
MCH: 29.7 pg (ref 26.0–34.0)
MCHC: 32.3 g/dL (ref 30.0–36.0)
MCV: 92 fL (ref 80.0–100.0)
Platelets: 133 10*3/uL — ABNORMAL LOW (ref 150–400)
RBC: 4.61 MIL/uL (ref 3.87–5.11)
RDW: 16 % — ABNORMAL HIGH (ref 11.5–15.5)
WBC: 12.4 10*3/uL — ABNORMAL HIGH (ref 4.0–10.5)
nRBC: 0 % (ref 0.0–0.2)

## 2023-05-14 LAB — RESPIRATORY PANEL BY PCR

## 2023-05-14 LAB — SARS CORONAVIRUS 2 BY RT PCR: SARS Coronavirus 2 by RT PCR: NEGATIVE

## 2023-05-14 LAB — MAGNESIUM: Magnesium: 2 mg/dL (ref 1.7–2.4)

## 2023-05-14 MED ORDER — GADOBUTROL 1 MMOL/ML IV SOLN
10.0000 mL | Freq: Once | INTRAVENOUS | Status: AC | PRN
Start: 2023-05-14 — End: 2023-05-14
  Administered 2023-05-14: 10 mL via INTRAVENOUS

## 2023-05-14 MED ORDER — FUROSEMIDE 40 MG PO TABS
40.0000 mg | ORAL_TABLET | Freq: Two times a day (BID) | ORAL | Status: DC
  Administered 2023-05-14 – 2023-05-19 (×11): 40 mg via ORAL
  Filled 2023-05-14 (×11): qty 1

## 2023-05-14 MED ORDER — BENZONATATE 100 MG PO CAPS
200.0000 mg | ORAL_CAPSULE | Freq: Three times a day (TID) | ORAL | Status: DC | PRN
  Administered 2023-05-14 – 2023-05-15 (×2): 200 mg via ORAL
  Filled 2023-05-14 (×2): qty 2

## 2023-05-14 NOTE — Plan of Care (Signed)
  Problem: Nutritional: Goal: Maintenance of adequate nutrition will improve Outcome: Progressing   Problem: Skin Integrity: Goal: Risk for impaired skin integrity will decrease Outcome: Progressing   Problem: Clinical Measurements: Goal: Will remain free from infection Outcome: Progressing

## 2023-05-14 NOTE — Progress Notes (Signed)
PROGRESS NOTE    SAVEAH Heidi Stark  ONG:[id number] DOB: 1941-06-18 DOA: 05/12/2023 PCP: Donato Schultz, DO    Brief Narrative:   Heidi Stark is a 82 y.o. 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  Patient reports over the last 3 days worsening pain to her mid to lower back on the right side.  Denies any trauma, falls or injuries.  History of chronic back pain related to ruptured disks which she required surgery in the 1990s.  Due to having worsening pain, patient reportedly took her daughter's morphine pills, took 7.5 mg around 9 AM and then another 15 mg of morphine around noon.  Following use of the pain medication, patient started having dizziness, nausea/vomiting.  Patient denies any saddle anesthesia, no bowel/bladder dysfunction, no lower extremity weakness or numbness.  Denies fevers, no chest pain, no shortness of breath, no abdominal pain.  Given her symptoms this prompted her to seek further care in the ED.  In the ED, temperature 97.6 F, HR 55, RR 22, BP 67/41, SpO2 95% on room air.  WBC 22.2, hemoglobin 16.2, platelet count 178.  Sodium 136, potassium 3.4, chloride 98, CO2 23, glucose 220, BUN 19, creat 1.67.  Lactic acid 2.6.  Urinalysis with greater than 500 glucose, otherwise negative.  CT L-spine with 50% inferior endplate compression fracture L1 with 1-2 mm posterior bony retropulsion, visible on 12/02/2022, lumbar spondylosis, DDD causing prominent pinching L2-3, L3-4 and L5-S1, moderate prominent impingement L4-5 and moderate impingement L1-2, prominent right foraminal  stenosis L5-S1, aortic atherosclerosis, 1.6 cm left adrenal adenoma.  Chest x-ray with cardiomegaly, vascular congestion and mild interstitial edema.  CT abdomen/pelvis without contrast with scattered gas/fluid levels throughout the nondistended small bowel, no evidence of high-grade bowel obstruction, cholelithiasis without cholecystitis, indeterminate 1.4 cm hypodense right renal lesion stable since 2017, simple cyst left kidney, small right periumbilical hernia containing mesenteric wall of the mid transverse colon, no evidence of bowel incarceration or ischemia, aortic atherosclerosis, chronic L1 compression deformity and severe multilevel lumbar spondylosis and facet hypertrophy.  Blood cultures x 2 obtained.   MRI of thoracic and lumbar spine ordered to rule out discitis/osteomyelitis but cannot be done overnight due to presence of ICD.  Patient was given IV Lasix 20 mg, Tigan, Tylenol, 2.75 L IV fluid boluses, and IV antibiotics including vancomycin, cefepime, and Flagyl in the ED.  Blood pressure improved.  TRH consulted for admission for further evaluation and management of severe sepsis of unclear etiology, acute on chronic back pain.  Assessment & Plan:   Severe sepsis Acute on chronic back pain Left lower extremity cellulitis Patient with history of chronic back pain presenting with worsening mid/low back pain for the past 3 days.  Met criteria for severe sepsis at the time of presentation with hypotension, leukocytosis, and lactic acidosis. CT of lumbar spine/abdomen/pelvis negative for acute findings.  Chest x-ray with no focal consolidation.  Patient does not have any red flag symptoms concerning for cauda equina syndrome or cord compression at this time.  Patient was given 2.75 L IV fluid resuscitation in the ED. -- WBC 22.2>14.7>12.4 -- Blood cultures x 2:  No growth x 2 days -- Vancomycin, pharmacy consulted for dosing/monitoring -- Cefepime 2 g IV every 8 hours -- Metronidazole 5 mg IV  every 12 hours -- Gabapentin 300 mg p.o. daily as needed pain -- Tylenol 1 g p.o. every 6 hours -- MRI of thoracic and lumbar spine: delayed due to presence of ICD; pending for today   Acute on chronic HFpEF NICM Patient received IV fluid boluses in the ED per sepsis protocol.  Chest x-ray is concerning for mild pulmonary edema. Last echo done in August 2023 showing EF 60 to 65%, grade 2 diastolic dysfunction, normal RV systolic function. Patient was given IV Lasix 20 mg in the ED. BNP 274.7.  Home regimen includes metoprolol succinate 25 mg p.o. nightly, furosemide 80 mg p.o. twice daily, eplerenone 25 mg p.o. daily, Entresto 24-26 mg twice daily. -- Continue supplemental oxygen, maintain SpO2 > 92%, wean as able -- Continue metoprolol succinate 25 mg p.o. nightly --Furosemide 40 mg p.o. twice daily -- Hold home Entresto and eplerenone due to hypotension on admission -- Strict I's and O's and daily weights   History of VT status post ICD Patient underwent ICD placement by Dr. Elberta Fortis on 09/15/2020 with [employer], model [id number] (serial number [id number]) right ventricular defibrillator lead, Medtronic Visia AF MRI VR SureScan serial Number [id number]) ICD.  -- Continue amiodarone and mexiletine.   Paroxysmal A-fib/flutter Currently in sinus rhythm and heart rate in the 80s.   -- Amiodarone 200 mg p.o. daily -- Mexiletine -- Eliquis 5mg  PO BID   Mild hypokalemia QT prolongation Potassium repleted.  -- Continue home antiarrhythmics -- avoid any other QT prolonging drugs/QT prolonging antiemetics. -- BMP daily   CAD -- Continue statin   COPD Stable, no signs of acute exacerbation.  Not on inhalers at home. -- Albuterol neb as needed shortness of breath/wheezing   OSA -- Continue CPAP at night.   CKD stage IIIb Baseline creatinine 1.3-1.5.   -- Cr 1.67>1.65>1.82 -- BMP daily   Hypothyroidism -- Levothyroxine 25 mcg 3 times weekly on M/W/F   Hyperlipidemia -- Pravastatin 80  mg p.o. daily -- Vascepa 2 g p.o. twice daily -- Fenofibrate 160 mg p.o. daily   Insulin-dependent type 2 diabetes, with hyperglycemia Hemoglobin A1c 6.7 i 05/12/2023.  Home medications include Farxiga, Humalog 75-25 30u BID. -- Semglee 10 units [place] daily -- SSI for coverage -- CBGs qAC/HS  Morbid obesity Body mass index is 43.62 kg/m.  Complicates all facets of care   DVT prophylaxis: SCDs Start: 05/13/23 0231 apixaban (ELIQUIS) tablet 5 mg    Code Status: Do not attempt resuscitation (DNR) PRE-ARREST INTERVENTIONS DESIRED Family Communication: No family present at bedside this morning  Disposition Plan:  Level of care: Progressive Status is: Inpatient Remains inpatient appropriate because: IV antibiotics, pending MR T/L-spine    Consultants:  None  Procedures:  None  Antimicrobials:  Vancomycin 10/26>> Cefepime 10/26>> Metronidazole 10/26>>   Subjective: Patient seen examined bedside, resting calmly.  Lying in bed.  Eating breakfast.  Back pain much improved.  WBC count improving.  Continues to await MRI which is pending and delayed due to ICD in place.  No other specific questions or concerns at this time.  Denies headache, no visual changes, no chest pain, no palpitations, no shortness of breath, no cough/congestion, no abdominal pain, no fever/chills/night sweats, no nausea/vomiting/diarrhea, no focal weakness, no fatigue, no paresthesias, no urinary symptoms.  No acute events overnight per nursing staff.  Objective: Vitals:   05/13/23 2048 05/13/23  2300 05/14/23 0400 05/14/23 0715  BP: (!) 112/57 (!) 107/47 (!) 100/46 (!) 147/80  Pulse: 91 83 72 75  Resp: 19 15 15 19   Temp: 97.9 F (36.6 C) 97.7 F (36.5 C) 97.7 F (36.5 C) 97.8 F (36.6 C)  TempSrc: Oral Oral Axillary Oral  SpO2: 94% 95% 95% 91%  Weight:   122.6 kg   Height:        Intake/Output Summary (Last 24 hours) at 05/14/2023 1027 Last data filed at 05/14/2023 0945 Gross per 24 hour  Intake --   Output 1525 ml  Net -1525 ml   Filed Weights   05/13/23 0200 05/14/23 0400  Weight: 115.7 kg 122.6 kg    Examination:  Physical Exam: GEN: NAD, alert and oriented x 3, wd/wn HEENT: NCAT, PERRL, EOMI, sclera clear, MMM PULM: CTAB w/o wheezes/crackles, normal respiratory effort CV: RRR w/o M/G/R GI: abd soft, NTND, NABS, no R/G/M MSK: no peripheral edema, muscle strength globally intact 5/5 bilateral upper/lower extremities NEURO: CN II-XII intact, no focal deficits, sensation to light touch intact PSYCH: normal mood/affect Integumentary: Left lower extremity erythema to anterior shin with scab covering previous excoriations, otherwise no other concerning rashes/lesions/wounds nonexposed skin surfaces.      Data Reviewed: I have personally reviewed following labs and imaging studies  CBC: Recent Labs  Lab 05/12/23 1535 05/13/23 0246 05/14/23 0219  WBC 22.2* 14.7* 12.4*  HGB 16.2* 16.7* 13.7  HCT 50.3* 49.4* 42.4  MCV 93.8 91.5 92.0  PLT 178 159 133*   Basic Metabolic Panel: Recent Labs  Lab 05/12/23 1535 05/13/23 0246 05/14/23 0219  NA 136 132* 131*  K 3.4* 3.8 4.4  CL 98 98 99  CO2 23 20* 19*  GLUCOSE 220* 207* 144*  BUN 17 21 35*  CREATININE 1.67* 1.65* 1.82*  CALCIUM 8.4* 7.7* 7.6*  MG  --  1.8 2.0   GFR: Estimated Creatinine Clearance: 32.4 mL/min (A) (by C-G formula based on SCr of 1.82 mg/dL (H)). Liver Function Tests: Recent Labs  Lab 05/13/23 0246 05/14/23 0219  AST 57* 50*  ALT 30 27  ALKPHOS 42 35*  BILITOT 1.1 0.9  PROT 6.4* 5.7*  ALBUMIN 3.0* 2.5*   No results for input(s): "LIPASE", "AMYLASE" in the last 168 hours. No results for input(s): "AMMONIA" in the last 168 hours. Coagulation Profile: No results for input(s): "INR", "PROTIME" in the last 168 hours. Cardiac Enzymes: No results for input(s): "CKTOTAL", "CKMB", "CKMBINDEX", "TROPONINI" in the last 168 hours. BNP (last 3 results) No results for input(s): "PROBNP" in the last  8760 hours. HbA1C: Recent Labs    05/13/23 0246  HGBA1C 6.7*   CBG: Recent Labs  Lab 05/13/23 1704 05/13/23 2048 05/14/23 0015 05/14/23 0409 05/14/23 0714  GLUCAP 149* 151* 148* 166* 138*   Lipid Profile: No results for input(s): "CHOL", "HDL", "LDLCALC", "TRIG", "CHOLHDL", "LDLDIRECT" in the last 72 hours. Thyroid Function Tests: No results for input(s): "TSH", "T4TOTAL", "FREET4", "T3FREE", "THYROIDAB" in the last 72 hours. Anemia Panel: No results for input(s): "VITAMINB12", "FOLATE", "FERRITIN", "TIBC", "IRON", "RETICCTPCT" in the last 72 hours. Sepsis Labs: Recent Labs  Lab 05/12/23 1555 05/12/23 2125 05/13/23 0245 05/13/23 0246 05/14/23 0219  PROCALCITON  --   --  1.94  --  1.82  LATICACIDVEN 2.6* 2.7*  --  2.3*  --     Recent Results (from the past 240 hour(s))  Culture, blood (single)     Status: None (Preliminary result)   Collection Time: 05/12/23  3:57 PM  Specimen: BLOOD  Result Value Ref Range Status   Specimen Description BLOOD SITE NOT SPECIFIED  Final   Special Requests   Final    BOTTLES DRAWN AEROBIC AND ANAEROBIC Blood Culture adequate volume   Culture   Final    NO GROWTH 2 DAYS Performed at [facility], [address], [place], [place] [address]    Report Status PENDING  Incomplete  Culture, blood (single)     Status: None (Preliminary result)   Collection Time: 05/12/23  4:16 PM   Specimen: BLOOD  Result Value Ref Range Status   Specimen Description BLOOD SITE NOT SPECIFIED  Final   Special Requests   Final    BOTTLES DRAWN AEROBIC AND ANAEROBIC Blood Culture adequate volume   Culture   Final    NO GROWTH 2 DAYS Performed at [facility], [address], [place], [place] [address]    Report Status PENDING  Incomplete  MRSA Next Gen by PCR, Nasal     Status: Abnormal   Collection Time: 05/13/23  3:19 PM   Specimen: Nasal Mucosa; Nasal Swab  Result Value Ref Range Status   MRSA by PCR Next Gen DETECTED (A) NOT DETECTED  Final    Comment: RESULT CALLED TO, READ BACK BY AND VERIFIED WITH: A HODGES,RN@2155  05/13/23 MK (NOTE) The GeneXpert MRSA Assay (FDA approved for NASAL specimens only), is one component of a comprehensive MRSA colonization surveillance program. It is not intended to diagnose MRSA infection nor to guide or monitor treatment for MRSA infections. Test performance is not FDA approved in patients less than 13 years old. Performed at [facility], [address] [place], [place] [address]          Radiology Studies: CT ABDOMEN PELVIS WO CONTRAST  Result Date: 05/12/2023 CLINICAL DATA:  Acute nonlocalized abdominal pain, known lumbar spine fracture EXAM: CT ABDOMEN AND PELVIS WITHOUT CONTRAST TECHNIQUE: Multidetector CT imaging of the abdomen and pelvis was performed following the standard protocol without IV contrast. RADIATION DOSE REDUCTION: This exam was performed according to the departmental dose-optimization program which includes automated exposure control, adjustment of the mA and/or kV according to patient size and/or use of iterative reconstruction technique. COMPARISON:  05/12/2023 at 2:55 p.m., 06/12/2016 FINDINGS: Lower chest: Scattered bilateral lower lobe scarring or atelectasis. No acute airspace disease. Mild cardiomegaly, with calcification of the mitral and aortic valves. No pericardial effusion. Hepatobiliary: Small calcified gallstones without evidence of acute cholecystitis. Unremarkable unenhanced appearance of the liver. No biliary ductal dilation or evidence of choledocholithiasis. Pancreas: Unremarkable unenhanced appearance. Spleen: Unremarkable unenhanced appearance. Adrenals/Urinary Tract: No urinary tract calculi or obstructive uropathy within either kidney. 1.6 cm simple appearing cyst left kidney does not require specific follow-up. 1.4 cm slightly hyperdense lesion within the lateral mid right kidney reference image 26/3, measures 38 HU. This was previously  evaluated in 2017, where it measured 1.3 cm demonstrating equivocal enhancement on that exam. Given the lack of significant interval increase in size, this likely reflects a hyperdense cyst. Stable left adrenal adenoma does not require imaging follow-up. The right adrenals unremarkable. The bladder is grossly normal. Stomach/Bowel: No evidence of high-grade bowel obstruction. Scattered gas fluid levels are seen throughout the normal caliber small bowel, which could reflect sequela of gastroenteritis. No bowel wall thickening or inflammatory change. Normal retrocecal appendix. Vascular/Lymphatic: Aortic atherosclerosis. No enlarged abdominal or pelvic lymph nodes. Reproductive: Uterus and bilateral adnexa are unremarkable. Other: No free fluid or free intraperitoneal gas. There  is a small right para umbilical Richter type hernia, containing a portion of the ventral wall of the mid transverse colon. Abdominal wall defect measures 2.6 x 2.2 cm. No evidence of incarceration or bowel wall ischemia. Musculoskeletal: No acute or destructive bony abnormalities. Stable appearance of the chronic inferior endplate compression deformity at L1. Stable diffuse multilevel lumbar spondylosis and facet hypertrophy as previously outlined on CT lumbar spine performed earlier today. Reconstructed images demonstrate no additional findings. IMPRESSION: 1. Scattered gas fluid levels throughout the nondistended small bowel, which may reflect sequela of gastroenteritis. No evidence of high-grade bowel obstruction. 2. Cholelithiasis without cholecystitis. 3. Indeterminate 1.4 cm slightly hyperdense right renal lesion, favor hyperdense cyst given the lack of significant change since 2017. Simple cyst left kidney. No specific imaging follow-up recommended. 4. Small right para umbilical Richter hernia containing a portion of the anti mesenteric wall of the mid transverse colon. No evidence of bowel incarceration or ischemia. 5.  Aortic  Atherosclerosis (ICD10-I70.0). 6. Chronic L1 compression deformity and severe multilevel lumbar spondylosis and facet hypertrophy. Please see preceding CT lumbar spine for full description of findings. Electronically Signed   By: Sharlet Salina M.D.   On: 05/12/2023 19:44   DG Chest Portable 1 View  Result Date: 05/12/2023 CLINICAL DATA:  Sepsis EXAM: PORTABLE CHEST 1 VIEW COMPARISON:  04/20/2021 FINDINGS: Left-sided single lead pacing device as before. Hypoventilatory changes. Cardiomegaly with vascular congestion and mild interstitial edema. No focal consolidation, pleural effusion or pneumothorax. IMPRESSION: Cardiomegaly with vascular congestion and mild interstitial edema. Electronically Signed   By: Jasmine Pang M.D.   On: 05/12/2023 19:01   CT Lumbar Spine Wo Contrast  Result Date: 05/12/2023 CLINICAL DATA:  Lumbar compression fracture EXAM: CT LUMBAR SPINE WITHOUT CONTRAST TECHNIQUE: Multidetector CT imaging of the lumbar spine was performed without intravenous contrast administration. Multiplanar CT image reconstructions were also generated. RADIATION DOSE REDUCTION: This exam was performed according to the departmental dose-optimization program which includes automated exposure control, adjustment of the mA and/or kV according to patient size and/or use of iterative reconstruction technique. COMPARISON:  12/02/2022 CT scan FINDINGS: Segmentation: The lowest lumbar type non-rib-bearing vertebra is labeled as L5. Alignment: No vertebral subluxation is observed. Vertebrae: 50% inferior endplate compression fracture at L1 with 1-2 mm of posterior bony retropulsion. This fracture was also visible on 12/02/2022. Minimal sclerosis along the compression fracture margin. No substantial increase in collapse compared to the prior exam. 3 mm degenerative retrolisthesis L2-3 and L4-5. Intervertebral vacuum disc phenomenon at all levels between T11 and S1, although only minimally at T12-L1. Loss of disc  height at all levels between T11 and S1 except for T12-L1 and L1-2. New or acute compression fracture is observed. Paraspinal and other soft tissues: Atherosclerosis is present, including aortoiliac atherosclerotic disease. 1.6 cm in short axis left adrenal mass, internal density 1.8 Hounsfield units, compatible with adenoma. No further imaging workup of this lesion is indicated. Disc levels: T12-L1: Unremarkable L1-2: Moderate central narrowing of the thecal sac due to posterior bony retropulsion, intervertebral spurring, and disc bulge. L2-3: Prominent central narrowing of the thecal sac and at least mild bilateral subarticular lateral recess stenosis due to disc osteophyte complex, short pedicles, and facet arthropathy. L3-4: Prominent central narrowing of the thecal sac with mild bilateral foraminal stenosis and at least moderate bilateral subarticular lateral recess stenosis due to disc osteophyte complex, facet arthropathy, and short pedicles. L4-5: Moderate to prominent central narrowing of the thecal sac with mild right and borderline left foraminal stenosis  due to facet arthropathy, disc osteophyte complex, and short pedicles. Possible right laminectomy at this level in past. L5-S1: Prominent right and mild left foraminal stenosis and at least mild central narrowing of the thecal sac due to prominent right facet spurring and short pedicles. IMPRESSION: 1. 50% inferior endplate compression fracture at L1 with 1-2 mm of posterior bony retropulsion. This fracture was also visible on 12/02/2022. No substantial increase in collapse compared to the prior exam. 2. Lumbar spondylosis, congenitally short pedicles, and degenerative disc disease, causing prominent impingement at L2-3, L3-4, and L5-S1; moderate to prominent impingement at L4-5; and moderate impingement at L1-2. 3. Prominent right foraminal stenosis at L5-S1. 4. Aortic atherosclerosis. 5. 1.6 cm in short axis left adrenal adenoma. No further imaging  workup of this lesion is indicated. Aortic Atherosclerosis (ICD10-I70.0). Electronically Signed   By: Gaylyn Rong M.D.   On: 05/12/2023 16:06        Scheduled Meds:  acetaminophen  1,000 mg Oral Q6H   amiodarone  200 mg Oral Daily   apixaban  5 mg Oral BID   Chlorhexidine Gluconate Cloth  6 each Topical Daily   fenofibrate  160 mg Oral Daily   furosemide  40 mg Oral BID   icosapent Ethyl  2 g Oral BID   insulin aspart  0-9 Units Subcutaneous Q4H   insulin glargine-yfgn  10 Units Subcutaneous Daily   levothyroxine  25 mcg Oral Once per day on Monday Wednesday Friday   metoprolol succinate  25 mg Oral QHS   mexiletine  300 mg Oral BID   mupirocin ointment  1 Application Nasal BID   pravastatin  80 mg Oral q1800   Continuous Infusions:  ceFEPime (MAXIPIME) IV 2 g (05/14/23 0418)   metronidazole 500 mg (05/14/23 0453)   vancomycin 750 mg (05/13/23 2011)     LOS: 1 day    Time spent: 52 minutes spent on chart review, discussion with nursing staff, consultants, updating family and interview/physical exam; more than 50% of that time was spent in counseling and/or coordination of care.    Alvira Philips [place], DO [facility] Available via Epic secure chat 7am-7pm After these hours, please refer to coverage provider listed on [email or web address] 05/14/2023, 10:27 AM

## 2023-05-14 NOTE — Progress Notes (Signed)
Heart Failure Navigator Progress Note  Assessed for Heart & Vascular TOC clinic readiness.  Patient does not meet criteria due to Advanced Heart Failure Team patient of Dr. McLean.   Navigator will sign off at this time.   Mahreen Schewe, BSN, RN Heart Failure Nurse Navigator Secure Chat Only   

## 2023-05-15 ENCOUNTER — Encounter: Payer: Self-pay | Admitting: Internal Medicine

## 2023-05-15 DIAGNOSIS — A419 Sepsis, unspecified organism: Secondary | ICD-10-CM | POA: Diagnosis not present

## 2023-05-15 DIAGNOSIS — R6520 Severe sepsis without septic shock: Secondary | ICD-10-CM | POA: Diagnosis not present

## 2023-05-15 DIAGNOSIS — M549 Dorsalgia, unspecified: Secondary | ICD-10-CM | POA: Diagnosis not present

## 2023-05-15 DIAGNOSIS — L03116 Cellulitis of left lower limb: Secondary | ICD-10-CM | POA: Diagnosis not present

## 2023-05-15 LAB — GLUCOSE, CAPILLARY
Glucose-Capillary: 115 mg/dL — ABNORMAL HIGH (ref 70–99)
Glucose-Capillary: 116 mg/dL — ABNORMAL HIGH (ref 70–99)
Glucose-Capillary: 132 mg/dL — ABNORMAL HIGH (ref 70–99)
Glucose-Capillary: 138 mg/dL — ABNORMAL HIGH (ref 70–99)
Glucose-Capillary: 178 mg/dL — ABNORMAL HIGH (ref 70–99)
Glucose-Capillary: 195 mg/dL — ABNORMAL HIGH (ref 70–99)

## 2023-05-15 LAB — COMPREHENSIVE METABOLIC PANEL
ALT: 26 U/L (ref 0–44)
AST: 41 U/L (ref 15–41)
Albumin: 2.3 g/dL — ABNORMAL LOW (ref 3.5–5.0)
Alkaline Phosphatase: 33 U/L — ABNORMAL LOW (ref 38–126)
Anion gap: 11 (ref 5–15)
BUN: 31 mg/dL — ABNORMAL HIGH (ref 8–23)
CO2: 22 mmol/L (ref 22–32)
Calcium: 7.4 mg/dL — ABNORMAL LOW (ref 8.9–10.3)
Chloride: 100 mmol/L (ref 98–111)
Creatinine, Ser: 1.47 mg/dL — ABNORMAL HIGH (ref 0.44–1.00)
GFR, Estimated: 36 mL/min — ABNORMAL LOW (ref >60)
Glucose, Bld: 147 mg/dL — ABNORMAL HIGH (ref 70–99)
Potassium: 3.7 mmol/L (ref 3.5–5.1)
Sodium: 133 mmol/L — ABNORMAL LOW (ref 135–145)
Total Bilirubin: 0.7 mg/dL (ref 0.3–1.2)
Total Protein: 5.3 g/dL — ABNORMAL LOW (ref 6.5–8.1)

## 2023-05-15 LAB — CBC
HCT: 40.4 % (ref 36.0–46.0)
Hemoglobin: 13.3 g/dL (ref 12.0–15.0)
MCH: 30.4 pg (ref 26.0–34.0)
MCHC: 32.9 g/dL (ref 30.0–36.0)
MCV: 92.2 fL (ref 80.0–100.0)
Platelets: 135 10*3/uL — ABNORMAL LOW (ref 150–400)
RBC: 4.38 MIL/uL (ref 3.87–5.11)
RDW: 15.8 % — ABNORMAL HIGH (ref 11.5–15.5)
WBC: 8.8 10*3/uL (ref 4.0–10.5)
nRBC: 0 % (ref 0.0–0.2)

## 2023-05-15 LAB — PROCALCITONIN: Procalcitonin: 0.87 ng/mL

## 2023-05-15 MED ORDER — ACETAMINOPHEN 325 MG PO TABS: 650.0000 mg | ORAL_TABLET | Freq: Four times a day (QID) | ORAL | Status: DC | PRN

## 2023-05-15 MED ORDER — SODIUM CHLORIDE 0.9 % IV SOLN
2.0000 g | INTRAVENOUS | Status: DC
  Administered 2023-05-15: 2 g via INTRAVENOUS
  Filled 2023-05-15: qty 20

## 2023-05-15 MED ORDER — INSULIN ASPART 100 UNIT/ML IJ SOLN
0.0000 [IU] | Freq: Three times a day (TID) | INTRAMUSCULAR | Status: DC
  Administered 2023-05-16: 2 [IU] via SUBCUTANEOUS
  Administered 2023-05-17: 1 [IU] via SUBCUTANEOUS
  Administered 2023-05-18: 2 [IU] via SUBCUTANEOUS
  Administered 2023-05-18: 1 [IU] via SUBCUTANEOUS

## 2023-05-15 MED ORDER — INSULIN ASPART 100 UNIT/ML IJ SOLN
0.0000 [IU] | Freq: Every day | INTRAMUSCULAR | Status: DC
  Administered 2023-05-16: 2 [IU] via SUBCUTANEOUS

## 2023-05-15 NOTE — Evaluation (Signed)
Physical Therapy Evaluation Patient Details Name: Heidi Stark MRN: [id number] DOB: 05-01-1941 Today's Date: 05/15/2023  History of Present Illness  Pt is 82 year old presented to [facility] on  05/12/23 for acute on chronic back pain. Pt found to have sepsis and acute on chronic heart failure.  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 Pt needs skilled PT to maximize independence and safety. Pt with generalized debility and will benefit from HHPT after dc.           If plan is discharge home, recommend the following: A little help with walking and/or transfers;Help with stairs or ramp for entrance;A little help with bathing/dressing/bathroom;Assistance with cooking/housework   Can travel by private vehicle        Equipment Recommendations None recommended by PT  Recommendations for Other Services       Functional Status Assessment       Precautions / Restrictions Precautions Precautions: Fall Restrictions Weight Bearing Restrictions: No      Mobility  Bed Mobility               General bed mobility comments: Pt up in recliner    Transfers Overall transfer level: Needs assistance Equipment used: Rollator (4 wheels), Rolling walker (2 wheels) Transfers: Sit to/from Stand Sit to Stand: Min assist           General transfer comment: Assist to power up and stabilize from low chair    Ambulation/Gait Ambulation/Gait assistance: Min assist Gait Distance (Feet): 30 Feet Assistive device: Rollator (4 wheels), Rolling walker (2 wheels) Gait Pattern/deviations: Step-through pattern, Decreased stride length,  Wide base of support, Trunk flexed Gait velocity: decr Gait velocity interpretation: <1.31 ft/sec, indicative of household ambulator   General Gait Details: Assist for balance and support. Started with rollator but it didn't provide enough support. Switched to rolling walker with more stability. Verbal cues to stay closer to walker and to keep both hands on walker. Pt tends to take 1 hand off and reach for furniture.  Stairs            Wheelchair Mobility     Tilt Bed    Modified Rankin (Stroke Patients Only)       Balance Overall balance assessment: Needs assistance Sitting-balance support: No upper extremity supported, Feet supported Sitting balance-Leahy Scale: Good     Standing balance support: Bilateral upper extremity supported, Reliant on assistive device for balance Standing balance-Leahy Scale: Poor Standing balance comment: UE support and CGA for static standing                             Pertinent Vitals/Pain Pain Assessment Pain Assessment: Faces Faces Pain Scale: Hurts a little bit Pain Location: low back Pain Descriptors / Indicators: Aching Pain Intervention(s): Monitored during session    Home Living Family/patient expects to be discharged to:: Private residence Living Arrangements: Children Available Help at Discharge: Family;Available 24 hours/day Type of Home: House Home Access: Ramped entrance       Home Layout: One level Home Equipment: Agricultural consultant (2 wheels);Rollator (4 wheels);Electric scooter;Shower seat;Lift chair      Prior  Function Prior Level of Function : Needs assist             Mobility Comments: Generally walks with 4WRW, scooter for community mobility ADLs Comments: Spouse passed around labor day, daughter stays with her, assists with iADL, community mobility.  Patient continues to complete her own ADL     Extremity/Trunk Assessment   Upper Extremity Assessment Upper Extremity Assessment: Defer to OT  evaluation    Lower Extremity Assessment Lower Extremity Assessment: Generalized weakness    Cervical / Trunk Assessment Cervical / Trunk Assessment: Normal;Other exceptions Cervical / Trunk Exceptions: Body habitus  Communication   Communication Communication: No apparent difficulties  Cognition Arousal: Alert Behavior During Therapy: WFL for tasks assessed/performed Overall Cognitive Status: Within Functional Limits for tasks assessed                                          General Comments      Exercises     Assessment/Plan    PT Assessment Patient needs continued PT services  PT Problem List Decreased strength;Decreased balance;Decreased mobility;Decreased activity tolerance;Obesity       PT Treatment Interventions DME instruction;Therapeutic activities;Gait training;Therapeutic exercise;Balance training;Functional mobility training;Patient/family education    PT Goals (Current goals can be found in the Care Plan section)  Acute Rehab PT Goals Patient Stated Goal: return home PT Goal Formulation: With patient Time For Goal Achievement: 05/29/23 Potential to Achieve Goals: Good    Frequency Min 1X/week     Co-evaluation               AM-PAC PT "6 Clicks" Mobility  Outcome Measure Help needed turning from your back to your side while in a flat bed without using bedrails?: A Little Help needed moving from lying on your back to sitting on the side of a flat bed without using bedrails?: A Little Help needed moving to and from a bed to a chair (including a wheelchair)?: A Little Help needed standing up from a chair using your arms (e.g., wheelchair or bedside chair)?: A Little Help needed to walk in hospital room?: A Little Help needed climbing 3-5 steps with a railing? : Total 6 Click Score: 16    End of Session   Activity Tolerance: Patient limited by fatigue Patient left: in chair;with call bell/phone within reach;with  family/visitor present   PT Visit Diagnosis: Unsteadiness on feet (R26.81);Other abnormalities of gait and mobility (R26.89);Muscle weakness (generalized) (M62.81)    Time: 1610-9604 PT Time Calculation (min) (ACUTE ONLY): 11 min   Charges:   PT Evaluation $PT Eval Low Complexity: 1 Low   PT General Charges $$ ACUTE PT VISIT: 1 Visit         [facility] PT Acute Rehabilitation Services Office [phone]   Angelina Ok [facility] 05/15/2023, 12:26 PM

## 2023-05-15 NOTE — Progress Notes (Signed)
PROGRESS NOTE    Heidi Stark  IRJ:[id number] DOB: [birth year] DOA: 05/12/2023 PCP: Donato Schultz, DO    Brief Narrative:   Heidi Stark is a 82 y.o. 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  Patient reports over the last 3 days worsening pain to her mid to lower back on the right side.  Denies any trauma, falls or injuries.  History of chronic back pain related to ruptured disks which she required surgery in the 1990s.  Due to having worsening pain, patient reportedly took her daughter's morphine pills, took 7.5 mg around 9 AM and then another 15 mg of morphine around noon.  Following use of the pain medication, patient started having dizziness, nausea/vomiting.  Patient denies any saddle anesthesia, no bowel/bladder dysfunction, no lower extremity weakness or numbness.  Denies fevers, no chest pain, no shortness of breath, no abdominal pain.  Given her symptoms this prompted her to seek further care in the ED.  In the ED, temperature 97.6 F, HR 55, RR 22, BP 67/41, SpO2 95% on room air.  WBC 22.2, hemoglobin 16.2, platelet count 178.  Sodium 136, potassium 3.4, chloride 98, CO2 23, glucose 220, BUN 19, creat 1.67.  Lactic acid 2.6.  Urinalysis with greater than 500 glucose, otherwise negative.  CT L-spine with 50% inferior endplate compression fracture L1 with 1-2 mm posterior bony retropulsion, visible on 12/02/2022, lumbar spondylosis, DDD causing prominent pinching L2-3, L3-4 and L5-S1, moderate prominent impingement L4-5 and moderate impingement L1-2, prominent right foraminal  stenosis L5-S1, aortic atherosclerosis, 1.6 cm left adrenal adenoma.  Chest x-ray with cardiomegaly, vascular congestion and mild interstitial edema.  CT abdomen/pelvis without contrast with scattered gas/fluid levels throughout the nondistended small bowel, no evidence of high-grade bowel obstruction, cholelithiasis without cholecystitis, indeterminate 1.4 cm hypodense right renal lesion stable since 2017, simple cyst left kidney, small right periumbilical hernia containing mesenteric wall of the mid transverse colon, no evidence of bowel incarceration or ischemia, aortic atherosclerosis, chronic L1 compression deformity and severe multilevel lumbar spondylosis and facet hypertrophy.  Blood cultures x 2 obtained.   MRI of thoracic and lumbar spine ordered to rule out discitis/osteomyelitis but cannot be done overnight due to presence of ICD.  Patient was given IV Lasix 20 mg, Tigan, Tylenol, 2.75 L IV fluid boluses, and IV antibiotics including vancomycin, cefepime, and Flagyl in the ED.  Blood pressure improved.  TRH consulted for admission for further evaluation and management of severe sepsis of unclear etiology, acute on chronic back pain.  Assessment & Plan:   Severe sepsis Acute on chronic back pain Left lower extremity cellulitis Patient with history of chronic back pain presenting with worsening mid/low back pain for the past 3 days.  Met criteria for severe sepsis at the time of presentation with hypotension, leukocytosis, and lactic acidosis. CT of lumbar spine/abdomen/pelvis negative for acute findings.  Chest x-ray with no focal consolidation.  Patient does not have any red flag symptoms concerning for cauda equina syndrome or cord compression at this time.  Patient was given 2.75 L IV fluid resuscitation in the ED. MR T/L-spine with no concerns for infectious process,  osteomyelitis, discitis; only notable for multilevel spinal stenosis which is chronic. -- WBC 22.2>14.7>12.4>8.8 --  Procalcitonin 1.94> 1.82> 0.87 -- Blood cultures x 2: No growth x 2 days -- Ceftriaxone 2 g IV every 24 hours -- Gabapentin 300 mg p.o. daily as needed pain -- Tylenol 650 mg p.o. every 6 hours as needed   Acute on chronic HFpEF NICM Patient received IV fluid boluses in the ED per sepsis protocol.  Chest x-ray is concerning for mild pulmonary edema. Last echo done in August 2023 showing EF 60 to 65%, grade 2 diastolic dysfunction, normal RV systolic function. Patient was given IV Lasix 20 mg in the ED. BNP 274.7.  Home regimen includes metoprolol succinate 25 mg p.o. nightly, furosemide 80 mg p.o. twice daily, eplerenone 25 mg p.o. daily, Entresto 24-26 mg twice daily. -- Continue supplemental oxygen, maintain SpO2 > 92%, wean as able -- Continue metoprolol succinate 25 mg p.o. nightly -- Furosemide 40 mg p.o. twice daily -- Hold home Entresto and eplerenone due to hypotension on admission -- Strict I's and O's and daily weights   History of VT status post ICD Patient underwent ICD placement by Dr. Elberta Fortis on 09/15/2020 with [employer], model [id number] (serial number [id number]) right ventricular defibrillator lead, Medtronic Visia AF MRI VR SureScan serial Number [id number]) ICD.  -- Continue amiodarone and mexiletine.   Paroxysmal A-fib/flutter Currently in sinus rhythm and heart rate in the 80s.   -- Amiodarone 200 mg p.o. daily -- Mexiletine -- Eliquis 5mg  PO BID   Mild hypokalemia QT prolongation Potassium repleted.  -- Continue home antiarrhythmics -- avoid any other QT prolonging drugs/QT prolonging antiemetics. -- BMP daily   CAD -- Continue statin   COPD Stable, no signs of acute exacerbation.  Not on inhalers at home. -- Albuterol neb as needed shortness of breath/wheezing   OSA -- Continue CPAP at night.   CKD stage IIIb Baseline creatinine 1.3-1.5.   -- Cr 1.67>1.65>1.82>1.47 -- BMP daily   Hypothyroidism -- Levothyroxine 25 mcg 3 times weekly on M/W/F    Hyperlipidemia -- Pravastatin 80 mg p.o. daily -- Vascepa 2 g p.o. twice daily -- Fenofibrate 160 mg p.o. daily   Insulin-dependent type 2 diabetes, with hyperglycemia Hemoglobin A1c 6.7 i 05/12/2023.  Home medications include Farxiga, Humalog 75-25 30u BID. -- Semglee 10 units Zwolle daily -- SSI for coverage -- CBGs qAC/HS  Morbid obesity Body mass index is 43.61 kg/m.  Complicates all facets of care   DVT prophylaxis: SCDs Start: 05/13/23 0231 apixaban (ELIQUIS) tablet 5 mg    Code Status: Do not attempt resuscitation (DNR) PRE-ARREST INTERVENTIONS DESIRED Family Communication: No family present at bedside this morning  Disposition Plan:  Level of care: Med-Surg Status is: Inpatient Remains inpatient appropriate because: IV antibiotics,     Consultants:  None  Procedures:  None  Antimicrobials:  Vancomycin 10/26 - 10/28 Cefepime 10/26 - 10/28 Metronidazole 10/26 - 10/28   Subjective: Patient seen examined bedside, resting calmly.  Lying in bed.  Eating breakfast.  Back pain much improved.  WBC now within normal limits. MR T/L spine w/o infection, osteomyelitis, discitis. Pending PT/OT evaluation.   No other specific questions or concerns at this time.  Denies headache, no visual changes, no chest pain, no palpitations, no shortness of breath, no cough/congestion, no abdominal pain, no fever/chills/night sweats, no nausea/vomiting/diarrhea, no focal weakness, no fatigue, no paresthesias, no urinary symptoms.  No acute events overnight per nursing staff.  Objective: Vitals:   05/14/23 2039 05/14/23 2304  05/15/23 0423 05/15/23 0747  BP:  104/60 110/60 (!) 112/55  Pulse: 76 69 68 70  Resp: 20 18 17 19   Temp:   98.4 F (36.9 C) 98.4 F (36.9 C)  TempSrc:   Oral Oral  SpO2: 94% 94% 92% 94%  Weight:   122.6 kg   Height:        Intake/Output Summary (Last 24 hours) at 05/15/2023 0948 Last data filed at 05/15/2023 0830 Gross per 24 hour  Intake --  Output 2700 ml   Net -2700 ml   Filed Weights   05/13/23 0200 05/14/23 0400 05/15/23 0423  Weight: 115.7 kg 122.6 kg 122.6 kg    Examination:  Physical Exam: GEN: NAD, alert and oriented x 3, wd/wn HEENT: NCAT, PERRL, EOMI, sclera clear, MMM PULM: CTAB w/o wheezes/crackles, normal respiratory effort, on room air CV: RRR w/o M/G/R GI: abd soft, NTND, NABS, no R/G/M MSK: no peripheral edema, muscle strength globally intact 5/5 bilateral upper/lower extremities NEURO: CN II-XII intact, no focal deficits, sensation to light touch intact PSYCH: normal mood/affect Integumentary: Left lower extremity erythema to anterior shin with scab covering previous excoriations, otherwise no other concerning rashes/lesions/wounds nonexposed skin surfaces.      Data Reviewed: I have personally reviewed following labs and imaging studies  CBC: Recent Labs  Lab 05/12/23 1535 05/13/23 0246 05/14/23 0219 05/15/23 0225  WBC 22.2* 14.7* 12.4* 8.8  HGB 16.2* 16.7* 13.7 13.3  HCT 50.3* 49.4* 42.4 40.4  MCV 93.8 91.5 92.0 92.2  PLT 178 159 133* 135*   Basic Metabolic Panel: Recent Labs  Lab 05/12/23 1535 05/13/23 0246 05/14/23 0219 05/15/23 0225  NA 136 132* 131* 133*  K 3.4* 3.8 4.4 3.7  CL 98 98 99 100  CO2 23 20* 19* 22  GLUCOSE 220* 207* 144* 147*  BUN 17 21 35* 31*  CREATININE 1.67* 1.65* 1.82* 1.47*  CALCIUM 8.4* 7.7* 7.6* 7.4*  MG  --  1.8 2.0  --    GFR: Estimated Creatinine Clearance: 40.1 mL/min (A) (by C-G formula based on SCr of 1.47 mg/dL (H)). Liver Function Tests: Recent Labs  Lab 05/13/23 0246 05/14/23 0219 05/15/23 0225  AST 57* 50* 41  ALT 30 27 26   ALKPHOS 42 35* 33*  BILITOT 1.1 0.9 0.7  PROT 6.4* 5.7* 5.3*  ALBUMIN 3.0* 2.5* 2.3*   No results for input(s): "LIPASE", "AMYLASE" in the last 168 hours. No results for input(s): "AMMONIA" in the last 168 hours. Coagulation Profile: No results for input(s): "INR", "PROTIME" in the last 168 hours. Cardiac Enzymes: No  results for input(s): "CKTOTAL", "CKMB", "CKMBINDEX", "TROPONINI" in the last 168 hours. BNP (last 3 results) No results for input(s): "PROBNP" in the last 8760 hours. HbA1C: Recent Labs    05/13/23 0246  HGBA1C 6.7*   CBG: Recent Labs  Lab 05/14/23 1551 05/14/23 2028 05/15/23 0007 05/15/23 0520 05/15/23 0746  GLUCAP 177* 178* 116* 178* 195*   Lipid Profile: No results for input(s): "CHOL", "HDL", "LDLCALC", "TRIG", "CHOLHDL", "LDLDIRECT" in the last 72 hours. Thyroid Function Tests: No results for input(s): "TSH", "T4TOTAL", "FREET4", "T3FREE", "THYROIDAB" in the last 72 hours. Anemia Panel: No results for input(s): "VITAMINB12", "FOLATE", "FERRITIN", "TIBC", "IRON", "RETICCTPCT" in the last 72 hours. Sepsis Labs: Recent Labs  Lab 05/12/23 1555 05/12/23 2125 05/13/23 0245 05/13/23 0246 05/14/23 0219 05/15/23 0225  PROCALCITON  --   --  1.94  --  1.82 0.87  LATICACIDVEN 2.6* 2.7*  --  2.3*  --   --  Recent Results (from the past 240 hour(s))  Culture, blood (single)     Status: None (Preliminary result)   Collection Time: 05/12/23  3:57 PM   Specimen: BLOOD  Result Value Ref Range Status   Specimen Description BLOOD SITE NOT SPECIFIED  Final   Special Requests   Final    BOTTLES DRAWN AEROBIC AND ANAEROBIC Blood Culture adequate volume   Culture   Final    NO GROWTH 2 DAYS Performed at [facility], [address], [place], [place] [address]    Report Status PENDING  Incomplete  Culture, blood (single)     Status: None (Preliminary result)   Collection Time: 05/12/23  4:16 PM   Specimen: BLOOD  Result Value Ref Range Status   Specimen Description BLOOD SITE NOT SPECIFIED  Final   Special Requests   Final    BOTTLES DRAWN AEROBIC AND ANAEROBIC Blood Culture adequate volume   Culture   Final    NO GROWTH 2 DAYS Performed at [facility], [address], [place], [place] [address]    Report Status PENDING  Incomplete  MRSA Next Gen by PCR,  Nasal     Status: Abnormal   Collection Time: 05/13/23  3:19 PM   Specimen: Nasal Mucosa; Nasal Swab  Result Value Ref Range Status   MRSA by PCR Next Gen DETECTED (A) NOT DETECTED Final    Comment: RESULT CALLED TO, READ BACK BY AND VERIFIED WITH: A HODGES,RN@2155  05/13/23 MK (NOTE) The GeneXpert MRSA Assay (FDA approved for NASAL specimens only), is one component of a comprehensive MRSA colonization surveillance program. It is not intended to diagnose MRSA infection nor to guide or monitor treatment for MRSA infections. Test performance is not FDA approved in patients less than 5 years old. Performed at [facility], [address], [place], [place] [address]   SARS Coronavirus 2 by RT PCR (hospital order, performed in [facility] hospital lab) *cepheid single result test* Anterior Nasal Swab     Status: None   Collection Time: 05/14/23  8:01 PM   Specimen: Anterior Nasal Swab  Result Value Ref Range Status   SARS Coronavirus 2 by RT PCR NEGATIVE NEGATIVE Final    Comment: Performed at [facility], [address], [place], [place] [address]  Respiratory (~20 pathogens) panel by PCR     Status: None   Collection Time: 05/14/23  8:01 PM   Specimen: Nasopharyngeal Swab; Respiratory  Result Value Ref Range Status   Adenovirus NOT DETECTED NOT DETECTED Final   Coronavirus 229E NOT DETECTED NOT DETECTED Final    Comment: (NOTE) The Coronavirus on the Respiratory Panel, DOES NOT test for the novel  Coronavirus (2019 nCoV)    Coronavirus HKU1 NOT DETECTED NOT DETECTED Final   Coronavirus NL63 NOT DETECTED NOT DETECTED Final   Coronavirus OC43 NOT DETECTED NOT DETECTED Final   Metapneumovirus NOT DETECTED NOT DETECTED Final   Rhinovirus / Enterovirus NOT DETECTED NOT DETECTED Final   Influenza A NOT DETECTED NOT DETECTED Final   Influenza B NOT DETECTED NOT DETECTED Final   Parainfluenza Virus 1 NOT DETECTED NOT DETECTED Final   Parainfluenza Virus 2 NOT DETECTED NOT  DETECTED Final   Parainfluenza Virus 3 NOT DETECTED NOT DETECTED Final   Parainfluenza Virus 4 NOT DETECTED NOT DETECTED Final   Respiratory Syncytial Virus NOT DETECTED NOT DETECTED Final   Bordetella pertussis NOT DETECTED NOT DETECTED Final   Bordetella Parapertussis NOT DETECTED NOT  DETECTED Final   Chlamydophila pneumoniae NOT DETECTED NOT DETECTED Final   Mycoplasma pneumoniae NOT DETECTED NOT DETECTED Final    Comment: Performed at [facility], [address], [place], [place] [address]         Radiology Studies: DG CHEST PORT 1 VIEW  Result Date: 05/14/2023 CLINICAL DATA:  Cough EXAM: PORTABLE CHEST 1 VIEW COMPARISON:  05/12/2023 FINDINGS: Left-sided single lead pacing device. Cardiomegaly. Streaky atelectasis or scarring at left base. No consolidation, pleural effusion, or pneumothorax. Aortic atherosclerosis. IMPRESSION: No active disease. Cardiomegaly. Streaky atelectasis or scarring at left base. Electronically Signed   By: Jasmine Pang M.D.   On: 05/14/2023 23:34   MR Lumbar Spine W Wo Contrast  Result Date: 05/14/2023 CLINICAL DATA:  Lumbar radiculopathy. Infection suspected. Abnormal CT scan. EXAM: MRI LUMBAR SPINE WITHOUT AND WITH CONTRAST TECHNIQUE: Multiplanar and multiecho pulse sequences of the lumbar spine were obtained without and with intravenous contrast. CONTRAST:  10mL GADAVIST GADOBUTROL 1 MMOL/ML IV SOLN COMPARISON:  CT of the lumbar spine 05/12/2023 FINDINGS: Segmentation: 5 non rib-bearing lumbar type vertebral bodies are present. The lowest fully formed vertebral body is L5. Alignment:  Grade 1 retrolisthesis at L2-3, L3-4 and L4-5 is stable. Vertebrae: Focal T2 hyperintensity is present at the anterior aspect of the inferior L1 fracture. Focal enhancement is present as well. Type 2 Modic changes are present at L2-3, L3-4, L4-5 and L5-S1. Marrow signal and vertebral body heights are otherwise normal. Conus medullaris and cauda equina: Conus extends to  the L1 level. Conus and cauda equina appear normal. Paraspinal and other soft tissues: Limited imaging the abdomen is unremarkable. There is no significant adenopathy. No solid organ lesions are present. Disc levels: T12-L1: Mild facet hypertrophy is present bilaterally. No focal disc protrusion or stenosis is present. L1-2: A broad-based disc protrusion is present. Moderate facet hypertrophy is present bilaterally. No focal stenosis is present. L2-3: A broad-based disc protrusion is present. Mild facet hypertrophy and ligamentum flavum thickening is noted. Prominent epidural fat is present. This results in crowding of the nerve roots in severe central canal stenosis. Moderate foraminal stenosis is present bilaterally. L3-4: A broad-based disc protrusion is present. Moderate facet hypertrophy and ligamentum flavum thickening is present. Prominent epidural fat contributes 2 crowding of the central nerve roots and severe central canal stenosis. Mild foraminal narrowing is present bilaterally. L4-5: A broad-based disc protrusion is present. Left laminectomy is present. Posterior canal is decompressed. Nerve roots are clumped. Prominent epidural fat is present. Mild foraminal narrowing is present bilaterally. L5-S1: A broad-based disc protrusion is present. Moderate to severe central canal stenosis is worse on the right. Moderate right foraminal stenosis is present. The postcontrast images demonstrate mild dural enhancement at the L3 level and below. No focal collections are present. IMPRESSION: 1. Focal T2 hyperintensity and enhancement at the anterior aspect of the inferior L1 fracture. This is favored to represent an acute or subacute fracture. 2. Severe central canal stenosis at L2-3 and L3-4 secondary to combination of disc protrusion, facet hypertrophy, ligamentum flavum thickening, and prominent epidural fat. 3. Moderate foraminal stenosis bilaterally at L2-3 and L3-4. 4. Mild foraminal narrowing bilaterally at  L4-5. 5. Moderate to severe central canal stenosis at L5-S1 is worse on the right. 6. Moderate right foraminal stenosis at L5-S1. 7. Mild dural enhancement at the L3 level and below is nonspecific, but likely reactive given the significant stenoses at these levels. No focal collections are present to suggest infection. Electronically Signed  By: Marin Roberts M.D.   On: 05/14/2023 15:23   MR THORACIC SPINE W WO CONTRAST  Result Date: 05/14/2023 CLINICAL DATA:  Osteomyelitis, thoracic. EXAM: MRI THORACIC WITHOUT AND WITH CONTRAST TECHNIQUE: Multiplanar and multiecho pulse sequences of the thoracic spine were obtained without and with intravenous contrast. CONTRAST:  10mL GADAVIST GADOBUTROL 1 MMOL/ML IV SOLN COMPARISON:  None Available. FINDINGS: Alignment: No significant listhesis is present in the thoracic spine. Normal thoracic kyphosis is present. Rightward curvature is centered at T6-7. Vertebrae: Chronic type 2 Modic changes are evident at T8-9 T9-10 and T11-12. Schmorl's nodes are present at T7-8, T8-9 and T10-11. The postcontrast images demonstrate no pathologic enhancement the spine are meninges. Cord:  Normal signal and morphology. Paraspinal and other soft tissues: The paraspinous soft tissues are within normal limits bilaterally. Mild dependent atelectasis is present both lungs. Limited imaging the abdomen is unremarkable. There is no significant adenopathy. No solid organ lesions are present. Disc levels: Shallow disc protrusions are present T4-5 and T6-7 without significant stenosis. Shallow central disc protrusions are present at T9-10 T10-11 and T11-12 without significant stenosis. Bilateral facet hypertrophy contributes 2 right greater than left foraminal narrowing at T9-10, T10-11 and T11-12. The foramina are otherwise patent throughout the thoracic spine. IMPRESSION: 1. No evidence for osteomyelitis or discitis. 2. Shallow disc protrusions at T4-5, T6-7, T9-10, T10-11 and T11-12  without significant stenosis. 3. Bilateral facet hypertrophy contributes to right greater than left foraminal narrowing at T9-10, T10-11 and T11-12. Electronically Signed   By: Marin Roberts M.D.   On: 05/14/2023 15:11        Scheduled Meds:  acetaminophen  1,000 mg Oral Q6H   amiodarone  200 mg Oral Daily   apixaban  5 mg Oral BID   Chlorhexidine Gluconate Cloth  6 each Topical Daily   fenofibrate  160 mg Oral Daily   furosemide  40 mg Oral BID   icosapent Ethyl  2 g Oral BID   insulin aspart  0-9 Units Subcutaneous Q4H   insulin glargine-yfgn  10 Units Subcutaneous Daily   levothyroxine  25 mcg Oral Once per day on Monday Wednesday Friday   metoprolol succinate  25 mg Oral QHS   mexiletine  300 mg Oral BID   mupirocin ointment  1 Application Nasal BID   pravastatin  80 mg Oral q1800   Continuous Infusions:  cefTRIAXone (ROCEPHIN)  IV       LOS: 2 days    Time spent: 52 minutes spent on chart review, discussion with nursing staff, consultants, updating family and interview/physical exam; more than 50% of that time was spent in counseling and/or coordination of care.    Alvira Philips [place], DO [facility] Available via Epic secure chat 7am-7pm After these hours, please refer to coverage provider listed on [email or web address] 05/15/2023, 9:48 AM

## 2023-05-15 NOTE — Evaluation (Signed)
Occupational Therapy Evaluation Patient Details Name: Heidi Stark MRN: [id number] DOB: 09-30-1940 Today's Date: 05/15/2023   History of Present Illness 82 y.o. 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   Clinical Impression   Patient admitted for the diagnosis above.  PTA she lives at home with her daughter, who assists with iADL, community mobility and meals.  Patient generally walks with a 4WRW, and continues to complete her own ADL with increased time and effort.  Currently she does have low back discomfort, chronic, and is needing up to Min A for lower body ADL and CGA with in room mobility.  OT will follow in the acute setting to address deficits, and no post acute OT is anticipated.         If plan is discharge home, recommend the following: Assist for transportation;Assistance with cooking/housework;A little help with bathing/dressing/bathroom;A lot of help with bathing/dressing/bathroom    Functional Status Assessment  Patient has had a recent decline in their functional status and demonstrates the ability to make significant improvements in function in a reasonable and predictable amount of time.  Equipment Recommendations  None recommended by OT    Recommendations for Other Services       Precautions / Restrictions Precautions Precautions: Fall Restrictions Weight Bearing Restrictions: No      Mobility Bed Mobility Overal bed mobility: Needs Assistance Bed Mobility: Supine to Sit     Supine to sit: Supervision, HOB elevated           Transfers Overall transfer level: Needs assistance   Transfers: Sit to/from Stand, Bed to chair/wheelchair/BSC Sit to Stand: Supervision     Step pivot transfers: Contact guard assist            Balance Overall balance assessment: Needs assistance Sitting-balance support: Feet supported Sitting balance-Leahy Scale: Good     Standing balance support: Reliant on assistive device for balance Standing balance-Leahy Scale: Poor                             ADL either performed or assessed with clinical judgement   ADL       Grooming: Wash/dry hands;Wash/dry face;Set up;Sitting           Upper Body Dressing : Minimal assistance;Sitting   Lower Body Dressing: Minimal assistance;Sit to/from stand   Toilet Transfer: Contact guard assist;Rolling walker (2 wheels);Ambulation;Regular Teacher, adult education Details (indicate cue type and reason): Has a tendency to take hands off the RW                 Vision Patient Visual Report: No change from baseline       Perception Perception: Within Functional Limits       Praxis Praxis: [facility]       Pertinent Vitals/Pain Pain Assessment Pain Assessment: Faces Faces Pain Scale: Hurts a little bit Pain Location: low back Pain Descriptors / Indicators: Aching Pain Intervention(s): Monitored during session     Extremity/Trunk Assessment Upper Extremity Assessment Upper Extremity Assessment: Overall WFL for tasks assessed   Lower Extremity Assessment Lower Extremity Assessment: Defer to PT evaluation  Cervical / Trunk Assessment Cervical / Trunk Assessment: Normal;Other exceptions Cervical / Trunk Exceptions: Body habitus   Communication Communication Communication: No apparent difficulties   Cognition Arousal: Alert Behavior During Therapy: WFL for tasks assessed/performed Overall Cognitive Status: Within Functional Limits for tasks assessed                                        General Comments   VSS on RA    Exercises     Shoulder Instructions      Home Living Family/patient expects to be discharged to:: Private residence Living Arrangements: Children Available Help at Discharge: Family;Available 24 hours/day Type of Home: House Home Access: Ramped entrance     Home Layout: One level     Bathroom Shower/Tub: Producer, television/film/video: Standard Bathroom Accessibility: Yes How Accessible: Accessible via walker Home Equipment: Rolling Walker (2 wheels);Rollator (4 wheels);Electric scooter;Shower seat;Lift chair          Prior Functioning/Environment Prior Level of Function : Needs assist             Mobility Comments: Generally walks with 4WRW, scooter for community mobility ADLs Comments: Spouse passed around labor day, daughter stays with her, assists with iADL, community mobility.  Patient continues to complete her own ADL        OT Problem List: Decreased strength;Decreased range of motion;Decreased activity tolerance;Impaired balance (sitting and/or standing);Increased edema;Pain      OT Treatment/Interventions: Self-care/ADL training;Therapeutic activities;Energy conservation;Balance training;Patient/family education    OT Goals(Current goals can be found in the care plan section) Acute Rehab OT Goals Patient Stated Goal: Return home OT Goal Formulation: With patient Time For Goal Achievement: 05/29/23 Potential to Achieve Goals: Good ADL Goals Pt Will Perform Grooming: with modified independence;standing Pt Will Perform Upper Body Dressing: Independently;sitting;standing Pt Will Perform Lower Body Dressing: sit to/from stand Pt Will Transfer to Toilet: with modified independence;ambulating;regular height toilet  OT Frequency: Min 1X/week    Co-evaluation              AM-PAC OT "6 Clicks" Daily Activity     Outcome Measure Help from another person eating meals?: None Help from another person taking care of  personal grooming?: A Little Help from another person toileting, which includes using toliet, bedpan, or urinal?: A Little Help from another person bathing (including washing, rinsing, drying)?: A Little Help from another person to put on and taking off regular upper body clothing?: A Little Help from another person to put on and taking off regular lower body clothing?: A Little 6 Click Score: 19   End of Session Equipment Utilized During Treatment: Rolling walker (2 wheels) Nurse Communication: Mobility status  Activity Tolerance: Patient tolerated treatment well Patient left: in chair;with call bell/phone within reach;with family/visitor present  OT Visit Diagnosis: Unsteadiness on feet (R26.81);Muscle weakness (generalized) (M62.81)                Time: 1610-9604 OT Time Calculation (min): 23 min Charges:  OT General Charges $OT Visit: 1 Visit OT Evaluation $OT Eval Moderate Complexity: 1 Mod OT Treatments $Self Care/Home Management : 8-22 mins  05/15/2023  RP, OTR/L  Acute Rehabilitation Services  Office:  [phone]   Suzanna Obey 05/15/2023, 9:59 AM

## 2023-05-16 DIAGNOSIS — E785 Hyperlipidemia, unspecified: Secondary | ICD-10-CM

## 2023-05-16 DIAGNOSIS — E66813 Obesity, class 3: Secondary | ICD-10-CM

## 2023-05-16 DIAGNOSIS — I10 Essential (primary) hypertension: Secondary | ICD-10-CM

## 2023-05-16 DIAGNOSIS — J42 Unspecified chronic bronchitis: Secondary | ICD-10-CM

## 2023-05-16 DIAGNOSIS — L03116 Cellulitis of left lower limb: Secondary | ICD-10-CM | POA: Diagnosis not present

## 2023-05-16 DIAGNOSIS — N183 Chronic kidney disease, stage 3 unspecified: Secondary | ICD-10-CM | POA: Diagnosis not present

## 2023-05-16 DIAGNOSIS — I5033 Acute on chronic diastolic (congestive) heart failure: Secondary | ICD-10-CM | POA: Diagnosis not present

## 2023-05-16 DIAGNOSIS — M545 Low back pain, unspecified: Secondary | ICD-10-CM | POA: Diagnosis not present

## 2023-05-16 DIAGNOSIS — E1169 Type 2 diabetes mellitus with other specified complication: Secondary | ICD-10-CM

## 2023-05-16 DIAGNOSIS — G4733 Obstructive sleep apnea (adult) (pediatric): Secondary | ICD-10-CM

## 2023-05-16 DIAGNOSIS — Z6841 Body Mass Index (BMI) 40.0 and over, adult: Secondary | ICD-10-CM

## 2023-05-16 DIAGNOSIS — E039 Hypothyroidism, unspecified: Secondary | ICD-10-CM

## 2023-05-16 LAB — COMPREHENSIVE METABOLIC PANEL
ALT: 24 U/L (ref 0–44)
AST: 37 U/L (ref 15–41)
Albumin: 2.3 g/dL — ABNORMAL LOW (ref 3.5–5.0)
Alkaline Phosphatase: 33 U/L — ABNORMAL LOW (ref 38–126)
Anion gap: 11 (ref 5–15)
BUN: 27 mg/dL — ABNORMAL HIGH (ref 8–23)
CO2: 25 mmol/L (ref 22–32)
Calcium: 7.5 mg/dL — ABNORMAL LOW (ref 8.9–10.3)
Chloride: 102 mmol/L (ref 98–111)
Creatinine, Ser: 1.18 mg/dL — ABNORMAL HIGH (ref 0.44–1.00)
GFR, Estimated: 46 mL/min — ABNORMAL LOW (ref >60)
Glucose, Bld: 110 mg/dL — ABNORMAL HIGH (ref 70–99)
Potassium: 3.6 mmol/L (ref 3.5–5.1)
Sodium: 138 mmol/L (ref 135–145)
Total Bilirubin: 0.7 mg/dL (ref 0.3–1.2)
Total Protein: 5.5 g/dL — ABNORMAL LOW (ref 6.5–8.1)

## 2023-05-16 LAB — GLUCOSE, CAPILLARY
Glucose-Capillary: 120 mg/dL — ABNORMAL HIGH (ref 70–99)
Glucose-Capillary: 151 mg/dL — ABNORMAL HIGH (ref 70–99)
Glucose-Capillary: 110 mg/dL — ABNORMAL HIGH (ref 70–99)
Glucose-Capillary: 127 mg/dL — ABNORMAL HIGH (ref 70–99)

## 2023-05-16 LAB — CBC
HCT: 40.6 % (ref 36.0–46.0)
Hemoglobin: 13.4 g/dL (ref 12.0–15.0)
MCH: 30.5 pg (ref 26.0–34.0)
MCHC: 33 g/dL (ref 30.0–36.0)
MCV: 92.3 fL (ref 80.0–100.0)
Platelets: 151 10*3/uL (ref 150–400)
RBC: 4.4 MIL/uL (ref 3.87–5.11)
RDW: 15.7 % — ABNORMAL HIGH (ref 11.5–15.5)
WBC: 6.4 10*3/uL (ref 4.0–10.5)
nRBC: 0 % (ref 0.0–0.2)

## 2023-05-16 LAB — PROCALCITONIN: Procalcitonin: 0.53 ng/mL

## 2023-05-16 MED ORDER — POTASSIUM CHLORIDE CRYS ER 20 MEQ PO TBCR
40.0000 meq | EXTENDED_RELEASE_TABLET | Freq: Once | ORAL | Status: AC
Start: 2023-05-16 — End: 2023-05-16
  Administered 2023-05-16: 40 meq via ORAL
  Filled 2023-05-16: qty 2

## 2023-05-16 MED ORDER — CEPHALEXIN 500 MG PO CAPS
500.0000 mg | ORAL_CAPSULE | Freq: Two times a day (BID) | ORAL | Status: AC
  Administered 2023-05-16 – 2023-05-18 (×6): 500 mg via ORAL
  Filled 2023-05-16 (×6): qty 1

## 2023-05-16 NOTE — Assessment & Plan Note (Addendum)
AKI on CKD stage 3b. Hyponatremia.   Renal function stable with serum cr at 1,1 with K at 3,9 and serum bicarbonate at 24.  Na 134. Mg 2.5   Plan to continue diuresis with furosemide per her home regimen.

## 2023-05-16 NOTE — Hospital Course (Addendum)
Heidi Stark was admitted to the hospital with the working diagnosis of severe sepsis due to left lower extremity cellulitis.   82 yo female with the past medical history of atrial fibrillation/ atrial flutter, heart failure, COPD, coronary artery disease, CKD, hypothyroidism, T2DM and history of DVT who presented with back pain. Reported acute exacerbation of chronic back pain, for the last 3 days prior to admission. She took her daughter's morphine, total of 22.5 mg over the prior 24 hrs before admission, developing dizziness, nausea and vomiting. On the ED her blood pressure was 67/41. She received IV fluids, with blood pressure improving at 100/56, HR 73, RR 22 and 02 saturation 97%, lungs with rales bilaterally, heart with S1 and S2 present and regular, abdomen with no distention and no lower extremity edema.  Left lower extremity with erythema to anterior shin.   WBC 22.2, hemoglobin 16.2, platelet count 178. Sodium 136, potassium 3.4, chloride 98, CO2 23, glucose 220, BUN 19, creat 1.67.  Lactic acid 2.6.  Urinalysis with greater than 500 glucose, otherwise negative.  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   Chest x-ray with cardiomegaly, vascular congestion and mild interstitial edema.   CT abdomen/pelvis without contrast with scattered gas/fluid levels throughout the nondistended small bowel, no evidence of high-grade bowel obstruction, cholelithiasis without cholecystitis, indeterminate 1.4 cm hypodense right renal lesion stable since 2017, simple cyst left kidney, small right periumbilical hernia containing mesenteric wall of the mid transverse colon, no evidence of bowel  incarceration or ischemia, aortic atherosclerosis, chronic L1 compression deformity and severe multilevel lumbar spondylosis and facet hypertrophy.   Blood cultures x 2 obtained.   Patient was given IV Lasix 20 mg and IV antibiotics including vancomycin, cefepime, and Flagyl in the ED.  Antibiotic therapy de escalated to IV ceftriaxone. 10/31 patient responding well to antibiotic therapy.   11/01 completed antibiotic with good toleration, plan for discharge home tomorrow.

## 2023-05-16 NOTE — TOC Initial Note (Signed)
Transition of Care [facility]) - Initial/Assessment Note    Patient Details  Name: Heidi Stark MRN: [id number] Date of Birth: 11/02/1940  Transition of Care [facility]) CM/SW Contact:    Leone Haven, RN Phone Number: 05/16/2023, 11:52 AM  Clinical Narrative:                 From home with daughter, has PCP and insurance on file, states has no HH services in place at this time, she has w/chair and walker.   States daughter will transport her home at [employer] and she is support system, states [facility] [address].   Pta self ambulatory.  Per PT eval rec HHPT, NCM offered choice to patient, she has no preference, NCM made referral to [facility] with [facility], he is able to take referral.  Soc will begin 24 to 48 hrs post dc.     Expected Discharge Plan: Home w Home Health Services Barriers to Discharge: Continued Medical Work up   Patient Goals and CMS Choice Patient states their goals for this hospitalization and ongoing recovery are:: return home with daughter CMS Medicare.gov Compare Post Acute Care list provided to:: Patient Choice offered to / list presented to : Patient      Expected Discharge Plan and Services In-house Referral: NA Discharge Planning Services: CM Consult Post Acute Care Choice: Home Health Living arrangements for the past 2 months: Single Family Home                 DME Arranged: N/A         HH Arranged: NA          Prior Living Arrangements/Services Living arrangements for the past 2 months: Single Family Home Lives with:: Adult Children Patient language and need for interpreter reviewed:: Yes Do you feel safe going back to the place where you live?: Yes      Need for Family Participation in Patient Care: Yes (Comment) Care giver support system in place?: Yes (comment) Current home services: DME (walker , w/chair) Criminal Activity/Legal Involvement Pertinent to Current Situation/Hospitalization: No - Comment as needed  Activities  of Daily Living   ADL Screening (condition at time of admission) Independently performs ADLs?: No Does the patient have a NEW difficulty with bathing/dressing/toileting/self-feeding that is expected to last >3 days?: No Does the patient have a NEW difficulty with getting in/out of bed, walking, or climbing stairs that is expected to last >3 days?: No Does the patient have a NEW difficulty with communication that is expected to last >3 days?: No Is the patient deaf or have difficulty hearing?: No Does the patient have difficulty seeing, even when wearing glasses/contacts?: No Does the patient have difficulty concentrating, remembering, or making decisions?: No  Permission Sought/Granted Permission sought to share information with : Case Manager Permission granted to share information with : Yes, Verbal Permission Granted     Permission granted to share info w AGENCY: HH        Emotional Assessment Appearance:: Appears stated age Attitude/Demeanor/Rapport: Engaged Affect (typically observed): Appropriate Orientation: : Oriented to Self, Oriented to Place, Oriented to  Time, Oriented to Situation Alcohol / Substance Use: Not Applicable Psych Involvement: No (comment)  Admission diagnosis:  Severe sepsis (HCC) [A41.9, R65.20] Hypotension, unspecified hypotension type [I95.9] Leukocytosis, unspecified type [D72.829] Patient Active Problem List   Diagnosis Date Noted   Type 2 diabetes mellitus with hyperlipidemia (HCC) 05/16/2023   Hypothyroidism 05/16/2023   Cellulitis of left lower extremity 05/13/2023  Back pain 05/13/2023   Acute on chronic diastolic CHF (congestive heart failure) (HCC) 05/13/2023   Diabetic nephropathy (HCC) 10/13/2021   Hyperglycemia due to type 2 diabetes mellitus (HCC) 10/13/2021   Proteinuria 10/13/2021   Pure hypercholesterolemia 10/13/2021   COVID-19 04/15/2021   Respiratory failure with hypoxia (HCC) 04/15/2021   Systolic CHF (HCC) 04/15/2021   CKD  (chronic kidney disease) stage 3, GFR 30-59 ml/min (HCC) 04/15/2021   Elevated troponin 04/15/2021   Acute on chronic systolic (congestive) heart failure (HCC) 09/14/2020   Wide-complex tachycardia 09/07/2020   Ventricular tachyarrhythmia (HCC) 09/07/2020   VT (ventricular tachycardia) (HCC) 09/07/2020   Persistent atrial fibrillation (HCC) 04/30/2018   Wound of left leg 10/01/2017   Cellulitis of right lower extremity 11/27/2016   Encounter for assessment for deep vein thrombosis (DVT) 11/14/2016   Hypokalemia 08/13/2016   Type 2 diabetes mellitus with hyperglycemia, with long-term current use of insulin (HCC)    Atherosclerosis of aorta (HCC) 06/26/2016   Colitis 11/24/2014   Severe obesity (BMI >= 40) (HCC) 11/12/2013   Paroxysmal atrial fibrillation (HCC) 11/03/2013   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 10/28/2013   COLONIC POLYPS 08/16/2010   PAD (peripheral artery disease) (HCC) 08/15/2010   HLD (hyperlipidemia) 11/18/2009   MYALGIA 11/18/2009   OBESITY 05/14/2007   PULMONARY NODULE, RIGHT MIDDLE LOBE 05/14/2007   Obstructive sleep apnea 12/06/2006   Essential hypertension 12/06/2006   COPD (chronic obstructive pulmonary disease) (HCC) 12/06/2006   PCP:  Donato Schultz, DO Pharmacy:   [id number], [place] - [address] [place] [address] Phone: [phone] Fax: [phone]     Social Determinants of Health (SDOH) Social History: SDOH Screenings   Food Insecurity: No Food Insecurity (05/16/2023)  Housing: Low Risk  (05/16/2023)  Transportation Needs: No Transportation Needs (05/16/2023)  Utilities: Not At Risk (05/16/2023)  Alcohol Screen: Low Risk  (10/13/2021)  Depression (PHQ2-9): Low Risk  (10/13/2021)  Financial Resource Strain: Medium Risk (10/13/2021)  Physical Activity: Insufficiently Active (10/13/2021)  Social Connections: Socially Integrated (10/13/2021)  Stress:  No Stress Concern Present (10/13/2021)  Tobacco Use: Medium Risk (04/19/2023)   SDOH Interventions:     Readmission Risk Interventions     No data to display

## 2023-05-16 NOTE — Progress Notes (Signed)
Removed patients foley with no complication. Education done.

## 2023-05-16 NOTE — Assessment & Plan Note (Addendum)
Echocardiogram with preserved LV systolic function with EF 60 to 65%, moderate LVH, RV systolic function preserved, RVSP 33.0 mmHg. LA with moderate dilatation, RA with mild dilatation,   Since admission fluid balance is negative -4,720 ml.  Systolic blood pressure 124 mmHg.   Plan to continue diuresis with furosemide 40 mg po bid.  Holding MRA and Entresto due to risk of hypotension.   History of VT, sp ICD 2022.  Continue with amiodarone and mexiletine.

## 2023-05-16 NOTE — Assessment & Plan Note (Addendum)
Patient was placed on insulin therapy during her hospitalization basal and short acting.  Her glucose has been stable with a fasting capillary glucose 115 on the day of discharge.   At home will continue statin and home regimen of insulin 75/25 formulation.

## 2023-05-16 NOTE — Care Management Important Message (Signed)
Important Message  Patient Details  Name: Heidi Stark MRN: [id number] Date of Birth: 09-19-1940   Important Message Given:  Yes - Medicare IM     Dorena Bodo 05/16/2023, 2:55 PM

## 2023-05-16 NOTE — Assessment & Plan Note (Signed)
Old records personally reviewed, neurology follow up from 04/2021. Seropositive ocular myasthenia gravis, no significant bulbar, or limb weakness noted.  He is not longer on Mestinon or prednisone, never treated with long term steroids sparing agent.   

## 2023-05-16 NOTE — Assessment & Plan Note (Signed)
Continue with levothyroxine  

## 2023-05-16 NOTE — Progress Notes (Addendum)
Progress Note   Patient: Heidi Stark:[id number] DOB: January 03, 1941 DOA: 05/12/2023     3 DOS: the patient was seen and examined on 05/16/2023   Brief hospital course: [name] was admitted to the hospital with the working diagnosis of severe sepsis due to left lower extremity cellulitis.   82 yo female with the past medical history of atrial fibrillation/ atrial flutter, heart failure, COPD, coronary artery disease, CKD, hypothyroidism, T2DM and history of DVT who presented with back pain. Reported acute exacerbation of chronic back pain, for the last 3 days prior to admission. She took her daughter's morphine, total of 22.5 mg over the last 24 hrs to admission, developing dizziness, nausea and vomiting. On the ED her blood pressure was 67/41. She received IV fluids, with blood pressure improving at 100/56, HR 73, RR 22 and 02 saturation 97%, lungs with rales bilaterally, heart with S1 and S2 present and regular, abdomen with no distention and no lower extremity edema.  Left lower extremity with erythema to anterior shin.   WBC 22.2, hemoglobin 16.2, platelet count 178. Sodium 136, potassium 3.4, chloride 98, CO2 23, glucose 220, BUN 19, creat 1.67.  Lactic acid 2.6.  Urinalysis with greater than 500 glucose, otherwise negative.  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   Chest x-ray with cardiomegaly, vascular congestion and mild interstitial edema.   CT abdomen/pelvis without contrast with scattered gas/fluid levels throughout the nondistended small bowel, no evidence of high-grade bowel obstruction, cholelithiasis without cholecystitis, indeterminate 1.4 cm hypodense right  renal lesion stable since 2017, simple cyst left kidney, small right periumbilical hernia containing mesenteric wall of the mid transverse colon, no evidence of bowel incarceration or ischemia, aortic atherosclerosis, chronic L1 compression deformity and severe multilevel lumbar spondylosis and facet hypertrophy.   Blood cultures x 2 obtained.   Patient was given IV Lasix 20 mg and IV antibiotics including vancomycin, cefepime, and Flagyl in the ED.  Antibiotic therapy de escalated to IV ceftriaxone.   Assessment and Plan: * Back pain No signs of spine infection. Her pain is better controlled and she is out of bed to the chair today.  Plan to continue pain control with gabapentin, and acetaminophen.  Discontinue foley catheter.  Avoid opiate analgesics.   Cellulitis of left lower extremity Complicated with sepsis, present on admission. Sepsis now has resolved.   Wbc is 6.4 and she has been afebrile.  Plan to transition to oral antibiotic therapy with cephalexin. Out of bed to chair tid with meals. PT and OT.   Acute on chronic diastolic CHF (congestive heart failure) (HCC) Echocardiogram with preserved LV systolic function with EF 60 to 65%, moderate LVH, RV systolic function preserved, RVSP 33.0 mmHg. LA with moderate dilatation, RA with mild dilatation,   Urine output is 1,950 ml Since admission fluid balance is negative -5,325 ml.  Systolic blood pressure 115 to 127 mmHg.   Plan to continue diuresis with furosemide 40 mg po bid.  Holding MRA and Entresto due to risk of hypotension.   History of VT, sp ICD 2022.  Continue with amiodarone and mexiletine.   CKD (chronic kidney disease) stage 3, GFR 30-59 ml/min (HCC) AKI on CKD stage 3b.   Renal function with serum cr at 1,18 with  K at 3,6 and serum bicarbonate at 25.  Na 138   Plan to continue diuresis with furosemide and will add 40 meq Kcl today.  Follow up renal function in am.   COPD (chronic obstructive pulmonary  disease) (HCC) No signs of acute exacerbation.   Paroxysmal atrial fibrillation (HCC) Continue rate control with metoprolol, amiodarone. Anticoagulation with apixaban.  Patient is off telemetry monitoring.   Obstructive sleep apnea Cpap  Type 2 diabetes mellitus with hyperlipidemia (HCC) Continue glucose cover and monitoring with insulin sliding scale.  Basal insulin with 10 units.   Continue with statin therapy.   Essential hypertension Continue blood pressure monitoring.  Entresto on hold.   OBESITY Calculated BMI is 43,6 consistent with obesity class 3.   Hypothyroidism Continue with levothyroxine.         Subjective: Patient with improvement in back pain but not yet back to baseline, has no dyspnea or chest pain, no lower extremity edema   Physical Exam: Vitals:   05/15/23 1531 05/15/23 1900 05/16/23 0537 05/16/23 0734  BP: (!) 101/52 (!) 127/55  (!) 115/44  Pulse: 62 66    Resp: 20   20  Temp: 97.9 F (36.6 C) 98.9 F (37.2 C)  98.7 F (37.1 C)  TempSrc: Oral Oral  Oral  SpO2:    94%  Weight:   122.7 kg   Height:       Neurology awake and alert ENT with mild pallor Cardiovascular with S1 and S2 present and regular with no gallops, rubs or murmurs  Respiratory with no rales or rhonchi Abdomen with no distention  No lower extremity edema Mild discoloration on left lower extremity edema  Data Reviewed:    Family Communication: I spoke with patient's daughter at the bedside, we talked in detail about patient's condition, plan of care and prognosis and all questions were addressed.    Disposition: Status is: Inpatient Remains inpatient appropriate because: PT and OT recovering from sepsis, possible discharge home on 11/01  Planned Discharge Destination: Home    Author: Coralie Keens, MD 05/16/2023 10:26 AM  For on call review [email or web address].

## 2023-05-16 NOTE — Assessment & Plan Note (Signed)
Continue blood pressure monitoring.  Entresto on hold.

## 2023-05-16 NOTE — Assessment & Plan Note (Signed)
Calculated BMI is 43,6 consistent with obesity class 3.

## 2023-05-16 NOTE — Assessment & Plan Note (Addendum)
Continue rate control with metoprolol, amiodarone. Anticoagulation with apixaban.

## 2023-05-16 NOTE — Assessment & Plan Note (Signed)
Cpap

## 2023-05-16 NOTE — Assessment & Plan Note (Addendum)
Back pain has improved.  No signs of spine infection. Her pain is better controlled and she is out of bed to the chair, with good toleration.  Plan to continue pain control with gabapentin, and acetaminophen.  Foley catheter was removed.  Avoid opiate analgesics.  Patient had as needed lorazepam for anxiety with good toleration. Will continue with night time lorazepam as needed for sleep.

## 2023-05-16 NOTE — Progress Notes (Signed)
Mobility Specialist Progress Note:   05/16/23 1000  Mobility  Activity Ambulated with assistance in room  Level of Assistance Minimal assist, patient does 75% or more  Assistive Device Front wheel walker  Distance Ambulated (ft) 40 ft (20+20)  Activity Response Tolerated well  Mobility Referral Yes  $Mobility charge 1 Mobility  Mobility Specialist Start Time (ACUTE ONLY) 1020  Mobility Specialist Stop Time (ACUTE ONLY) 1026  Mobility Specialist Time Calculation (min) (ACUTE ONLY) 6 min    Pt received in chair, agreeable to mobility. MinA for STS and turning w/ walker. Asymptomatic w/ no complaints throughout. Declined entering hallway today. Pt left in chair with call bell and all needs met. Family present.  D'Vante Earlene Plater Mobility Specialist Please contact via Special educational needs teacher or Rehab office at [phone]

## 2023-05-16 NOTE — Progress Notes (Signed)
Physical Therapy Treatment Patient Details Name: Heidi Stark MRN: [id number] DOB: Feb 25, 1941 Today's Date: 05/16/2023   History of Present Illness Pt is 82 year old presented to [facility] on  05/12/23 for acute on chronic back pain. Pt found to have sepsis and acute on chronic heart failure.  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  Note back pain which is chronic and pt states she "just has to live with it".  She seems more stable than last session based on PT note, though continues to be fall risk.  Feel she will benefit from HHPT at d/c.  Patient requesting [facility] for agency, RNCM made aware.  PT will follow.     If plan is discharge home, recommend the following: A little help with walking and/or transfers;Help with stairs or ramp for entrance;A little help with bathing/dressing/bathroom;Assistance with cooking/housework   Can travel by private vehicle        Equipment Recommendations  None recommended by PT    Recommendations for Other Services       Precautions / Restrictions Precautions Precautions: Fall     Mobility  Bed Mobility Overal bed mobility: Needs Assistance       Supine to sit: Supervision, HOB elevated     General bed mobility comments: increased time and heavy UE support needed    Transfers Overall transfer level: Needs assistance Equipment used: Rolling walker (2 wheels) Transfers: Sit to/from Stand Sit to Stand: Contact guard assist, From elevated surface           General transfer comment: requesting higher surface and pt needing increased time for    Ambulation/Gait Ambulation/Gait assistance: Contact guard  assist Gait Distance (Feet): 30 Feet Assistive device: Rolling walker (2 wheels) Gait Pattern/deviations: Step-through pattern, Decreased stride length, Wide base of support, Trunk flexed       General Gait Details: assist for safety, using RW in the room then grabbing footboard when near foot of the bed, cues for safety esp backing up to recliner   Stairs             Wheelchair Mobility     Tilt Bed    Modified Rankin (Stroke Patients Only)       Balance Overall balance assessment: Needs assistance   Sitting balance-Leahy Scale: Good     Standing balance support: Reliant on assistive device for balance, Bilateral upper extremity supported Standing balance-Leahy Scale: Poor Standing balance comment: UE support for balance                            Cognition Arousal: Alert Behavior During Therapy: WFL for tasks assessed/performed Overall Cognitive Status: Within Functional Limits for tasks assessed                                          Exercises      General Comments General comments (skin integrity, edema, etc.): SpO2 96% and HR 94 bpm after ambulation; dyspnea 2/4      Pertinent Vitals/Pain Pain Assessment Faces Pain  Scale: Hurts little more Pain Location: low back Pain Descriptors / Indicators: Aching, Discomfort Pain Intervention(s): Monitored during session    Home Living                          Prior Function            PT Goals (current goals can now be found in the care plan section) Progress towards PT goals: Progressing toward goals    Frequency    Min 1X/week      PT Plan      Co-evaluation              AM-PAC PT "6 Clicks" Mobility   Outcome Measure  Help needed turning from your back to your side while in a flat bed without using bedrails?: A Little Help needed moving from lying on your back to sitting on the side of a flat bed without using bedrails?: A Little Help  needed moving to and from a bed to a chair (including a wheelchair)?: A Little Help needed standing up from a chair using your arms (e.g., wheelchair or bedside chair)?: A Little Help needed to walk in hospital room?: A Little Help needed climbing 3-5 steps with a railing? : Total 6 Click Score: 16    End of Session Equipment Utilized During Treatment: Gait belt Activity Tolerance: Patient limited by pain Patient left: in chair;with call bell/phone within reach   PT Visit Diagnosis: Unsteadiness on feet (R26.81);Other abnormalities of gait and mobility (R26.89);Muscle weakness (generalized) (M62.81)     Time: 1531-1550 PT Time Calculation (min) (ACUTE ONLY): 19 min  Charges:    $Gait Training: 8-22 mins PT General Charges $$ ACUTE PT VISIT: 1 Visit                     Sheran Lawless, PT Acute Rehabilitation Services [phone] 05/16/2023    Elray Mcgregor 05/16/2023, 4:24 PM

## 2023-05-16 NOTE — Assessment & Plan Note (Addendum)
Complicated with sepsis, present on admission. Sepsis now has resolved.   No leukocytosis of fever.  Completed today antibiotic therapy with cephalexin. Out of bed to chair tid with meals. PT and OT.

## 2023-05-17 ENCOUNTER — Encounter (HOSPITAL_COMMUNITY): Payer: Self-pay | Admitting: Internal Medicine

## 2023-05-17 DIAGNOSIS — I5033 Acute on chronic diastolic (congestive) heart failure: Secondary | ICD-10-CM | POA: Diagnosis not present

## 2023-05-17 DIAGNOSIS — M545 Low back pain, unspecified: Secondary | ICD-10-CM | POA: Diagnosis not present

## 2023-05-17 DIAGNOSIS — E039 Hypothyroidism, unspecified: Secondary | ICD-10-CM

## 2023-05-17 DIAGNOSIS — L03116 Cellulitis of left lower limb: Secondary | ICD-10-CM | POA: Diagnosis not present

## 2023-05-17 DIAGNOSIS — I480 Paroxysmal atrial fibrillation: Secondary | ICD-10-CM

## 2023-05-17 DIAGNOSIS — N183 Chronic kidney disease, stage 3 unspecified: Secondary | ICD-10-CM | POA: Diagnosis not present

## 2023-05-17 LAB — CBC
HCT: 39.6 % (ref 36.0–46.0)
Hemoglobin: 12.9 g/dL (ref 12.0–15.0)
MCH: 29.9 pg (ref 26.0–34.0)
MCHC: 32.6 g/dL (ref 30.0–36.0)
MCV: 91.7 fL (ref 80.0–100.0)
Platelets: 160 10*3/uL (ref 150–400)
RBC: 4.32 MIL/uL (ref 3.87–5.11)
RDW: 15.5 % (ref 11.5–15.5)
WBC: 6.5 10*3/uL (ref 4.0–10.5)
nRBC: 0 % (ref 0.0–0.2)

## 2023-05-17 LAB — BASIC METABOLIC PANEL
Anion gap: 11 (ref 5–15)
BUN: 25 mg/dL — ABNORMAL HIGH (ref 8–23)
CO2: 24 mmol/L (ref 22–32)
Calcium: 7.5 mg/dL — ABNORMAL LOW (ref 8.9–10.3)
Chloride: 100 mmol/L (ref 98–111)
Creatinine, Ser: 1.24 mg/dL — ABNORMAL HIGH (ref 0.44–1.00)
GFR, Estimated: 44 mL/min — ABNORMAL LOW (ref >60)
Glucose, Bld: 117 mg/dL — ABNORMAL HIGH (ref 70–99)
Potassium: 3.7 mmol/L (ref 3.5–5.1)
Sodium: 135 mmol/L (ref 135–145)

## 2023-05-17 LAB — CULTURE, BLOOD (SINGLE)
Culture: NO GROWTH
Culture: NO GROWTH
Special Requests: ADEQUATE
Special Requests: ADEQUATE

## 2023-05-17 LAB — GLUCOSE, CAPILLARY
Glucose-Capillary: 116 mg/dL — ABNORMAL HIGH (ref 70–99)
Glucose-Capillary: 96 mg/dL (ref 70–99)
Glucose-Capillary: 125 mg/dL — ABNORMAL HIGH (ref 70–99)
Glucose-Capillary: 119 mg/dL — ABNORMAL HIGH (ref 70–99)

## 2023-05-17 NOTE — Progress Notes (Signed)
Progress Note   Patient: Heidi Stark WUJ:[id number] DOB: 06/28/1941 DOA: 05/12/2023     4 DOS: the patient was seen and examined on 05/17/2023   Brief hospital course: Heidi Stark was admitted to the hospital with the working diagnosis of severe sepsis due to left lower extremity cellulitis.   82 yo female with the past medical history of atrial fibrillation/ atrial flutter, heart failure, COPD, coronary artery disease, CKD, hypothyroidism, T2DM and history of DVT who presented with back pain. Reported acute exacerbation of chronic back pain, for the last 3 days prior to admission. She took her daughter's morphine, total of 22.5 mg over the prior 24 hrs before admission, developing dizziness, nausea and vomiting. On the ED her blood pressure was 67/41. She received IV fluids, with blood pressure improving at 100/56, HR 73, RR 22 and 02 saturation 97%, lungs with rales bilaterally, heart with S1 and S2 present and regular, abdomen with no distention and no lower extremity edema.  Left lower extremity with erythema to anterior shin.   WBC 22.2, hemoglobin 16.2, platelet count 178. Sodium 136, potassium 3.4, chloride 98, CO2 23, glucose 220, BUN 19, creat 1.67.  Lactic acid 2.6.  Urinalysis with greater than 500 glucose, otherwise negative.  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   Chest x-ray with cardiomegaly, vascular congestion and mild interstitial edema.   CT abdomen/pelvis without contrast with scattered gas/fluid levels throughout the nondistended small bowel, no evidence of high-grade bowel obstruction, cholelithiasis without cholecystitis, indeterminate 1.4 cm hypodense  right renal lesion stable since 2017, simple cyst left kidney, small right periumbilical hernia containing mesenteric wall of the mid transverse colon, no evidence of bowel incarceration or ischemia, aortic atherosclerosis, chronic L1 compression deformity and severe multilevel lumbar spondylosis and facet hypertrophy.   Blood cultures x 2 obtained.   Patient was given IV Lasix 20 mg and IV antibiotics including vancomycin, cefepime, and Flagyl in the ED.  Antibiotic therapy de escalated to IV ceftriaxone. 10/31 patient responding well to antibiotic therapy.    Assessment and Plan: * Back pain No signs of spine infection. Her pain is better controlled and she is out of bed to the chair today.  Plan to continue pain control with gabapentin, and acetaminophen.  Discontinue foley catheter.  Avoid opiate analgesics.   Cellulitis of left lower extremity Complicated with sepsis, present on admission. Sepsis now has resolved.   Wbc is 6.5 and she has been afebrile.  Continue antibiotic therapy with cephalexin. Out of bed to chair tid with meals. PT and OT.   Acute on chronic diastolic CHF (congestive heart failure) (HCC) Echocardiogram with preserved LV systolic function with EF 60 to 65%, moderate LVH, RV systolic function preserved, RVSP 33.0 mmHg. LA with moderate dilatation, RA with mild dilatation,   Urine output is 725 ml Since admission fluid balance is negative -5,080 ml.  Systolic blood pressure 119 to 124 mmHg.   Plan to continue diuresis with furosemide 40 mg po bid.  Holding MRA and Entresto due to risk of hypotension.   History of VT, sp ICD 2022.  Continue with amiodarone and mexiletine.   CKD (chronic kidney disease) stage 3, GFR 30-59 ml/min (HCC) AKI on CKD stage 3b.   Renal function with serum  cr at 1,24 with K at 3,7 and serum bicarbonate at 24. Na 135   Plan to continue diuresis with furosemide Follow up renal function in am.   COPD (chronic obstructive  pulmonary disease) (HCC) No signs of acute exacerbation.   Paroxysmal atrial fibrillation (HCC) Continue rate control with metoprolol, amiodarone. Anticoagulation with apixaban.  Patient is off telemetry monitoring.   Obstructive sleep apnea Cpap  Type 2 diabetes mellitus with hyperlipidemia (HCC) Continue glucose cover and monitoring with insulin sliding scale.  Basal insulin with 10 units.  Fasting glucose today is 117 mg/dl.  Continue with statin therapy.   Essential hypertension Continue blood pressure monitoring.  Entresto on hold.   OBESITY Calculated BMI is 43,6 consistent with obesity class 3.   Hypothyroidism Continue with levothyroxine.         Subjective: Patient with improvement in back pain, continue very weak and deconditioned, no chest pain. Not able to go home until Saturday   Physical Exam: Vitals:   05/17/23 0749 05/17/23 0800 05/17/23 1139 05/17/23 1214  BP: 111/66  (!) 124/58   Pulse: (!) 58  (!) 58   Resp: 18 18  18   Temp: 97.7 F (36.5 C)     TempSrc: Oral     SpO2: 94%   94%  Weight:      Height:       Neurology awake and alert ENT with mild pallor Cardiovascular with S1 and S2 present and regular with no gallops, rubs or murmurs Respiratory with no rales or wheezing, no rhonchi Abdomen with no distention  No lower extremity edema. 3 Data Reviewed:    Family Communication: no family at the bedside   Disposition: Status is: Inpatient Remains inpatient appropriate because: plan for discharge home on 11/02   Planned Discharge Destination: Home     Author: Coralie Keens, MD 05/17/2023 3:52 PM  For on call review [email or web address].

## 2023-05-17 NOTE — Progress Notes (Signed)
Occupational Therapy Treatment Patient Details Name: Heidi Stark MRN: [id number] DOB: 1941-02-04 Today's Date: 05/17/2023   History of present illness Pt is 82 year old presented to [facility] on  05/12/23 for acute on chronic back pain. Pt found to have sepsis and acute on chronic heart failure.  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  10/31 Cellulitis of left lower extremity.   OT comments  Patient improving to room air, improved activity tolerance noted.  Patient needing generalized supervision for in room mobility/toileting, stand grooming and slight Min A for lower body ADL.  OT will continue to follow in the acute setting to address deficits, but given daughter's assist, no post acute OT is anticipated, but can be considered if the patient/daughter want that service.  HH PT is more of a priority.        If plan is discharge home, recommend the following:  Assist for transportation;Assistance with cooking/housework;A little help with bathing/dressing/bathroom;A lot of help with bathing/dressing/bathroom   Equipment Recommendations  None recommended by OT    Recommendations for Other Services      Precautions / Restrictions Precautions Precautions: Fall Precaution Comments: O2sats Restrictions Weight Bearing Restrictions: No       Mobility Bed Mobility                    Transfers Overall transfer level: Needs assistance Equipment used: Rolling walker (2 wheels) Transfers: Sit to/from Stand Sit to Stand: Supervision     Step pivot transfers: Supervision           Balance   Sitting-balance support: No upper extremity supported, Feet supported Sitting balance-Leahy Scale: Good     Standing balance support: Reliant on assistive device for  balance Standing balance-Leahy Scale: Poor                             ADL either performed or assessed with clinical judgement   ADL       Grooming: Wash/dry hands;Wash/dry face;Supervision/safety;Standing           Upper Body Dressing : Set up;Sitting   Lower Body Dressing: Contact guard assist;Sit to/from stand   Toilet Transfer: Supervision/safety;Ambulation;Rolling walker (2 wheels);Regular Toilet                  Extremity/Trunk Assessment Upper Extremity Assessment Upper Extremity Assessment: Overall WFL for tasks assessed   Lower Extremity Assessment Lower Extremity Assessment: Defer to PT evaluation   Cervical / Trunk Assessment Cervical / Trunk Assessment: Normal    Vision Patient Visual Report: No change from baseline     Perception Perception Perception: Not tested   Praxis Praxis Praxis: Not tested    Cognition Arousal: Alert Behavior During Therapy: [facility] for tasks assessed/performed Overall Cognitive Status: Within Functional Limits for tasks assessed                                                             Pertinent Vitals/ Pain       Pain Assessment Pain Assessment: No/denies pain Pain Intervention(s): Monitored during  session                                                          Frequency  Min 1X/week        Progress Toward Goals  OT Goals(current goals can now be found in the care plan section)  Progress towards OT goals: Progressing toward goals  Acute Rehab OT Goals OT Goal Formulation: With patient Time For Goal Achievement: 05/29/23 Potential to Achieve Goals: Good  Plan      Co-evaluation                 AM-PAC OT "6 Clicks" Daily Activity     Outcome Measure   Help from another person eating meals?: None Help from another person taking care of personal grooming?: A Little Help from another person toileting, which includes using  toliet, bedpan, or urinal?: A Little Help from another person bathing (including washing, rinsing, drying)?: A Little Help from another person to put on and taking off regular upper body clothing?: A Little Help from another person to put on and taking off regular lower body clothing?: A Little 6 Click Score: 19    End of Session Equipment Utilized During Treatment: Rolling walker (2 wheels)  OT Visit Diagnosis: Unsteadiness on feet (R26.81);Muscle weakness (generalized) (M62.81)   Activity Tolerance Patient tolerated treatment well   Patient Left in chair;with call bell/phone within reach;with family/visitor present   Nurse Communication Mobility status        Time: 2595-6387 OT Time Calculation (min): 22 min  Charges: OT General Charges $OT Visit: 1 Visit OT Treatments $Self Care/Home Management : 8-22 mins  05/17/2023  RP, OTR/L  Acute Rehabilitation Services  Office:  [phone]   Suzanna Obey 05/17/2023, 5:17 PM

## 2023-05-17 NOTE — Progress Notes (Signed)
Mobility Specialist Progress Note:   05/17/23 1502  Mobility  Activity Ambulated with assistance in room  Level of Assistance Contact guard assist, steadying assist  Assistive Device Front wheel walker  Distance Ambulated (ft) 30 ft  Activity Response Tolerated well  Mobility Referral Yes  $Mobility charge 1 Mobility  Mobility Specialist Start Time (ACUTE ONLY) 1315  Mobility Specialist Stop Time (ACUTE ONLY) 1325  Mobility Specialist Time Calculation (min) (ACUTE ONLY) 10 min    Pt received in chair, agreeable to mobility. CG to stand and ambulate in room. Pt declining hallway ambulation. Asx throughout. Pt returned to chair with call bell in reach and all needs met.  Heidi Stark  Mobility Specialist Please contact via [employer] office at [phone]

## 2023-05-18 DIAGNOSIS — M545 Low back pain, unspecified: Secondary | ICD-10-CM | POA: Diagnosis not present

## 2023-05-18 DIAGNOSIS — N183 Chronic kidney disease, stage 3 unspecified: Secondary | ICD-10-CM | POA: Diagnosis not present

## 2023-05-18 DIAGNOSIS — I5033 Acute on chronic diastolic (congestive) heart failure: Secondary | ICD-10-CM | POA: Diagnosis not present

## 2023-05-18 DIAGNOSIS — L03116 Cellulitis of left lower limb: Secondary | ICD-10-CM | POA: Diagnosis not present

## 2023-05-18 LAB — RENAL FUNCTION PANEL
Albumin: 2.7 g/dL — ABNORMAL LOW (ref 3.5–5.0)
Anion gap: 14 (ref 5–15)
BUN: 23 mg/dL (ref 8–23)
CO2: 24 mmol/L (ref 22–32)
Calcium: 7.8 mg/dL — ABNORMAL LOW (ref 8.9–10.3)
Chloride: 96 mmol/L — ABNORMAL LOW (ref 98–111)
Creatinine, Ser: 1.14 mg/dL — ABNORMAL HIGH (ref 0.44–1.00)
GFR, Estimated: 48 mL/min — ABNORMAL LOW (ref >60)
Glucose, Bld: 107 mg/dL — ABNORMAL HIGH (ref 70–99)
Phosphorus: 2.5 mg/dL (ref 2.5–4.6)
Potassium: 3.9 mmol/L (ref 3.5–5.1)
Sodium: 134 mmol/L — ABNORMAL LOW (ref 135–145)

## 2023-05-18 LAB — GLUCOSE, CAPILLARY
Glucose-Capillary: 132 mg/dL — ABNORMAL HIGH (ref 70–99)
Glucose-Capillary: 159 mg/dL — ABNORMAL HIGH (ref 70–99)
Glucose-Capillary: 129 mg/dL — ABNORMAL HIGH (ref 70–99)
Glucose-Capillary: 182 mg/dL — ABNORMAL HIGH (ref 70–99)

## 2023-05-18 NOTE — Progress Notes (Signed)
Progress Note   Patient: Heidi Stark YQM:[id number] DOB: 1941-03-23 DOA: 05/12/2023     5 DOS: the patient was seen and examined on 05/18/2023   Brief hospital course: Heidi Stark was admitted to the hospital with the working diagnosis of severe sepsis due to left lower extremity cellulitis.   82 yo female with the past medical history of atrial fibrillation/ atrial flutter, heart failure, COPD, coronary artery disease, CKD, hypothyroidism, T2DM and history of DVT who presented with back pain. Reported acute exacerbation of chronic back pain, for the last 3 days prior to admission. She took her daughter's morphine, total of 22.5 mg over the prior 24 hrs before admission, developing dizziness, nausea and vomiting. On the ED her blood pressure was 67/41. She received IV fluids, with blood pressure improving at 100/56, HR 73, RR 22 and 02 saturation 97%, lungs with rales bilaterally, heart with S1 and S2 present and regular, abdomen with no distention and no lower extremity edema.  Left lower extremity with erythema to anterior shin.   WBC 22.2, hemoglobin 16.2, platelet count 178. Sodium 136, potassium 3.4, chloride 98, CO2 23, glucose 220, BUN 19, creat 1.67.  Lactic acid 2.6.  Urinalysis with greater than 500 glucose, otherwise negative.  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   Chest x-ray with cardiomegaly, vascular congestion and mild interstitial edema.   CT abdomen/pelvis without contrast with scattered gas/fluid levels throughout the nondistended small bowel, no evidence of high-grade bowel obstruction, cholelithiasis without cholecystitis, indeterminate 1.4 cm hypodense  right renal lesion stable since 2017, simple cyst left kidney, small right periumbilical hernia containing mesenteric wall of the mid transverse colon, no evidence of bowel incarceration or ischemia, aortic atherosclerosis, chronic L1 compression deformity and severe multilevel lumbar spondylosis and facet hypertrophy.   Blood cultures x 2 obtained.   Patient was given IV Lasix 20 mg and IV antibiotics including vancomycin, cefepime, and Flagyl in the ED.  Antibiotic therapy de escalated to IV ceftriaxone. 10/31 patient responding well to antibiotic therapy.   11/01 completed antibiotic with good toleration, plan for discharge home tomorrow.   Assessment and Plan: * Back pain No signs of spine infection. Her pain is better controlled and she is out of bed to the chair today.  Plan to continue pain control with gabapentin, and acetaminophen.  Discontinue foley catheter.  Avoid opiate analgesics.   Cellulitis of left lower extremity Complicated with sepsis, present on admission. Sepsis now has resolved.   No leukocytosis of fever.  Completed today antibiotic therapy with cephalexin. Out of bed to chair tid with meals. PT and OT.   Acute on chronic diastolic CHF (congestive heart failure) (HCC) Echocardiogram with preserved LV systolic function with EF 60 to 65%, moderate LVH, RV systolic function preserved, RVSP 33.0 mmHg. LA with moderate dilatation, RA with mild dilatation,   Since admission fluid balance is negative -4,720 ml.  Systolic blood pressure 124 mmHg.   Plan to continue diuresis with furosemide 40 mg po bid.  Holding MRA and Entresto due to risk of hypotension.   History of VT, sp ICD 2022.  Continue with amiodarone and mexiletine.   CKD (chronic kidney disease) stage 3, GFR 30-59 ml/min (HCC) AKI on CKD stage 3b. Hyponatremia.   Renal  function stable with serum cr at 1,1 with K at 3,9 and serum bicarbonate at 24.  Na 134. Mg 2.5   Plan to continue diuresis  with furosemide per her home regimen.   COPD (chronic obstructive pulmonary disease) (HCC) No signs of acute exacerbation.   Paroxysmal atrial fibrillation (HCC) Continue rate control with metoprolol, amiodarone. Anticoagulation with apixaban.  Patient is off telemetry monitoring.   Obstructive sleep apnea Cpap  Type 2 diabetes mellitus with hyperlipidemia (HCC) Continue glucose cover and monitoring with insulin sliding scale.  Basal insulin with 10 units.  Fasting glucose today is 107 mg/dl.  Continue with statin therapy.   Essential hypertension Continue blood pressure monitoring.  Entresto on hold.   OBESITY Calculated BMI is 43,6 consistent with obesity class 3.   Hypothyroidism Continue with levothyroxine.         Subjective: Patient with no chest pain or dyspnea, out of bed, improved back pain   Physical Exam: Vitals:   05/17/23 1951 05/18/23 0419 05/18/23 0501 05/18/23 1105  BP: 131/76  125/77 (!) 145/72  Pulse: (!) 58  (!) 54 (!) 58  Resp:    17  Temp: 98.5 F (36.9 C)  98.6 F (37 C) 97.6 F (36.4 C)  TempSrc: Oral  Oral Oral  SpO2:   98% 98%  Weight:  124.9 kg    Height:       Neurology awake and alert ENT with mild pallor Cardiovascular with S1 and S2 present with no gallops. Respiratory with no rales or rhonchi Abdomen with no distention  No lower extremity edema  Data Reviewed:    Family Communication: no family at the bedside   Disposition: Status is: Inpatient Remains inpatient appropriate because: plan for discharge home tomorrow.   Planned Discharge Destination: Home     Author: Coralie Keens, MD 05/18/2023 5:17 PM  For on call review [email or web address].

## 2023-05-18 NOTE — Progress Notes (Signed)
PT Cancellation Note  Patient Details Name: Heidi Stark MRN: [id number] DOB: February 10, 1941   Cancelled Treatment:    Reason Eval/Treat Not Completed: Pain limiting ability to participate; checked twice on pt and reports back pain is worse this pm.  States plans to get up when needing to go to the bathroom.  Noted pt likely for d/c soon.  Will follow up as able.   Elray Mcgregor 05/18/2023, 4:38 PM Sheran Lawless, PT Acute Rehabilitation Services Office:[phone] 05/18/2023

## 2023-05-18 NOTE — Progress Notes (Signed)
Mobility Specialist Progress Note:   05/18/23 0900  Mobility  Activity Ambulated with assistance in room  Level of Assistance Contact guard assist, steadying assist  Assistive Device Front wheel walker  Distance Ambulated (ft) 40 ft  Activity Response Tolerated well  Mobility Referral Yes  $Mobility charge 1 Mobility  Mobility Specialist Start Time (ACUTE ONLY) 0840  Mobility Specialist Stop Time (ACUTE ONLY) 0849  Mobility Specialist Time Calculation (min) (ACUTE ONLY) 9 min    Pt received in chair, agreeable to mobility. C/o R side back pain upon arrival. Asymptomatic w/ no complaints during ambulation. Pt left in chair with call bell and all needs met.  D'Vante Earlene Plater Mobility Specialist Please contact via Special educational needs teacher or Rehab office at [phone]

## 2023-05-19 ENCOUNTER — Other Ambulatory Visit (HOSPITAL_COMMUNITY): Payer: Self-pay

## 2023-05-19 DIAGNOSIS — I5033 Acute on chronic diastolic (congestive) heart failure: Secondary | ICD-10-CM | POA: Diagnosis not present

## 2023-05-19 DIAGNOSIS — M4856XS Collapsed vertebra, not elsewhere classified, lumbar region, sequela of fracture: Secondary | ICD-10-CM | POA: Diagnosis not present

## 2023-05-19 DIAGNOSIS — N183 Chronic kidney disease, stage 3 unspecified: Secondary | ICD-10-CM | POA: Diagnosis not present

## 2023-05-19 DIAGNOSIS — S32010A Wedge compression fracture of first lumbar vertebra, initial encounter for closed fracture: Secondary | ICD-10-CM

## 2023-05-19 DIAGNOSIS — L03116 Cellulitis of left lower limb: Secondary | ICD-10-CM | POA: Diagnosis not present

## 2023-05-19 LAB — GLUCOSE, CAPILLARY
Glucose-Capillary: 115 mg/dL — ABNORMAL HIGH (ref 70–99)
Glucose-Capillary: 112 mg/dL — ABNORMAL HIGH (ref 70–99)

## 2023-05-19 MED ORDER — FUROSEMIDE 40 MG PO TABS: 40.0000 mg | ORAL_TABLET | Freq: Two times a day (BID) | ORAL | Status: DC

## 2023-05-19 MED ORDER — LORAZEPAM 0.5 MG PO TABS
0.5000 mg | ORAL_TABLET | Freq: Every evening | ORAL | 0 refills | Status: DC | PRN
  Filled 2023-05-19: qty 15, 15d supply, fill #0

## 2023-05-19 MED ORDER — POTASSIUM CHLORIDE CRYS ER 10 MEQ PO TBCR: 10.0000 meq | EXTENDED_RELEASE_TABLET | Freq: Two times a day (BID) | ORAL | Status: DC

## 2023-05-19 NOTE — Plan of Care (Signed)

## 2023-05-19 NOTE — Discharge Summary (Signed)
Physician Discharge Summary   Patient: Heidi Stark MRN: [id number] DOB: [birth year]  Admit date:     05/12/2023  Discharge date: 05/19/23  Discharge Physician: York Ram Kaiden Pech   PCP: Donato Schultz, DO   Recommendations at discharge:    Sherryll Burger on hold to prevent hypotension, to consider resuming medication as outpatient if blood pressure allows. Decreased furosemide from 80 mg bid to 40 mg bid to prevent hypotension  K supplements reduced to 10 meq daily to prevent hyperkalemia.  Patient completed antibiotic therapy in the hospital Added as needed lorazepam 0,5 mg at night for sleep.  Follow up with Dr Zola Button in 7 to 10 days. Follow up with Cardiology as scheduled.   Discharge Diagnoses: Principal Problem:   Compression fracture of lumbar spine, non-traumatic, sequela Active Problems:   Cellulitis of left lower extremity   Acute on chronic diastolic CHF (congestive heart failure) (HCC)   CKD (chronic kidney disease) stage 3, GFR 30-59 ml/min (HCC)   COPD (chronic obstructive pulmonary disease) (HCC)   Paroxysmal atrial fibrillation (HCC)   Obstructive sleep apnea   Type 2 diabetes mellitus with hyperlipidemia (HCC)   Essential hypertension   OBESITY   Hypothyroidism  Resolved Problems:   * No resolved hospital problems. [facility] Course: Heidi Stark was admitted to the hospital with the working diagnosis of severe sepsis due to left lower extremity cellulitis.   82 yo female with the past medical history of atrial fibrillation/ atrial flutter, heart failure, COPD, coronary artery disease, CKD, hypothyroidism, T2DM and history of DVT who presented with back pain. Reported acute exacerbation of chronic back pain, for the last 3 days prior to admission. She took her daughter's morphine, total of 22.5 mg over the prior 24 hrs before admission, developing dizziness, nausea and vomiting. On the ED her blood pressure was 67/41. She received IV fluids, with  blood pressure improving at 100/56, HR 73, RR 22 and 02 saturation 97%, lungs with rales bilaterally, heart with S1 and S2 present and regular, abdomen with no distention and no lower extremity edema.  Left lower extremity with erythema to anterior shin.    Sodium 136, potassium 3.4, chloride 98, CO2 23, glucose 220, BUN 19, creat 1.67.  Lactic acid 2.6.  WBC 22.2, hemoglobin 16.2, platelet count 178.   Urinalysis with greater than 500 glucose, otherwise negative.   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   Chest x-ray with cardiomegaly, vascular congestion and mild interstitial edema.   CT abdomen/pelvis without contrast with scattered gas/fluid levels throughout the nondistended small bowel, no evidence of high-grade bowel obstruction, cholelithiasis without cholecystitis, indeterminate 1.4 cm hypodense right renal lesion stable since 2017, simple cyst left kidney, small right periumbilical hernia containing mesenteric wall of the mid transverse colon, no evidence of bowel incarceration or ischemia, aortic atherosclerosis, chronic L1 compression deformity and severe multilevel lumbar spondylosis and facet hypertrophy.   EKG 54 bpm, left axis deviation, right bundle branch block, qtc 527, sinus rhythm with 1st degree AV block, no significant ST segment or T wave changes.  Low voltage.   Blood cultures x 2 obtained.   Patient was given IV Lasix 20 mg and IV antibiotics including vancomycin, cefepime, and Flagyl in the ED.  Antibiotic therapy de escalated to IV ceftriaxone. 10/31 patient responding well to antibiotic therapy.   11/01 completed antibiotic with good toleration,  plan for discharge home tomorrow.  11/02 plan to go home today and follow up with primary care as outpatient.   Assessment and Plan: * Compression fracture of lumbar spine, non-traumatic, sequela Back pain has improved.  No signs of spine infection. Her pain is better controlled and she is out of bed to the chair, with good toleration.  Plan to continue pain control with gabapentin, and acetaminophen.  Foley catheter was removed.  Avoid opiate analgesics.  Patient had as needed lorazepam for anxiety with good toleration. Will continue with night time lorazepam as needed for sleep.   Cellulitis of left lower extremity Complicated with sepsis, present on admission. (Because hypotension, consistent with severe sepsis).  Sepsis now has resolved.   No leukocytosis of fever.  Completed today antibiotic therapy with cephalexin. Continue to encourage mobility at home, home health services.  Acute on chronic diastolic CHF (congestive heart failure) (HCC) Echocardiogram with preserved LV systolic function with EF 60 to 65%, moderate LVH, RV systolic function preserved, RVSP 33.0 mmHg. LA with moderate dilatation, RA with mild dilatation,   Since admission fluid balance is negative -4,480 ml.  Systolic blood pressure 124 to 138 mmHg.   Continue diuresis with furosemide 40 mg po bid.  Eplerenone and Entresto due to risk of hypotension. At the time of her discharge will resume MRA but will continue to hold on Entresto for now.   Resume SGLT 2 inh.   History of VT, sp ICD 2022.  Continue with amiodarone and mexiletine.   CKD (chronic kidney disease) stage 3, GFR 30-59 ml/min (HCC) AKI on CKD stage 3b. Hyponatremia.   Renal function stable with serum cr at 1,1 with K at 3,9 and serum bicarbonate at 24.  Na 134. Mg 2.5   Dose of furosemide has been reduced to 40 mg bid from 80 mg bid due to hypotension. Further adjustments as outpatient. Follow up renal function and electrolytes as  outpatient.   COPD (chronic obstructive pulmonary disease) (HCC) No signs of acute exacerbation.   Paroxysmal atrial fibrillation (HCC) Continue rate control with metoprolol, amiodarone. Anticoagulation with apixaban.   Obstructive sleep apnea Cpap  Type 2 diabetes mellitus with hyperlipidemia (HCC) Patient was placed on insulin therapy during her hospitalization basal and short acting.  Her glucose has been stable with a fasting capillary glucose 115 on the day of discharge.   At home will continue statin and home regimen of insulin 75/25 formulation.   Essential hypertension Continue blood pressure monitoring.  Entresto on hold.   OBESITY Calculated BMI is 43,6 consistent with obesity class 3.   Hypothyroidism Continue with levothyroxine.          Consultants: none  Procedures performed: none   Disposition: Home Diet recommendation:  Discharge Diet Orders (From admission, onward)     Start     Ordered   05/19/23 0000  Diet - low sodium heart healthy        05/19/23 0921           Cardiac and Carb modified diet DISCHARGE MEDICATION: Allergies as of 05/19/2023       Reactions   Neomycin-bacitracin Zn-polymyx Rash   Sulfonamide Derivatives Other (See Comments)   Stomach cramps   Sulfa Antibiotics Other (See Comments)   Ciprofloxacin Hives, Itching, Rash   Pt doesn't remember having any reaction to cipro   Spironolactone  Rash        Medication List     STOP taking these medications    Entresto 24-26 MG Generic drug: sacubitril-valsartan       TAKE these medications    acetaminophen 500 MG tablet Commonly known as: TYLENOL Take 500 mg by mouth every 6 (six) hours as needed for moderate pain (pain score 4-6) or headache.   amiodarone 200 MG tablet Commonly known as: PACERONE Take 200 mg by mouth daily.   apixaban 5 MG Tabs tablet Commonly known as: Eliquis Take 1 tablet (5 mg total) by mouth 2 (two) times daily.   BD Pen Needle Nano  2nd Gen 32G X 4 MM Misc Generic drug: Insulin Pen Needle See admin instructions.   dapagliflozin propanediol 10 MG Tabs tablet Commonly known as: Farxiga TAKE 1 TABLET BY MOUTH EVERY DAY BEFORE BREAKFAST   eplerenone 25 MG tablet Commonly known as: INSPRA TAKE 1 TABLET BY MOUTH EVERY DAY   fenofibrate 145 MG tablet Commonly known as: TRICOR TAKE 1 TABLET BY MOUTH EVERY DAY   furosemide 40 MG tablet Commonly known as: LASIX Take 1 tablet (40 mg total) by mouth 2 (two) times daily. What changed: how much to take   gabapentin 300 MG capsule Commonly known as: NEURONTIN Take 300 mg by mouth daily as needed (pain).   HumaLOG Mix 75/25 KwikPen (75-25) 100 UNIT/ML KwikPen Generic drug: Insulin Lispro Prot & Lispro Take 40 units with morning meal and 30 units with evening meal What changed:  how much to take how to take this when to take this additional instructions   icosapent Ethyl 1 g capsule Commonly known as: VASCEPA TAKE 2 CAPSULES BY MOUTH TWICE A DAY   levothyroxine 25 MCG tablet Commonly known as: SYNTHROID 25 mcg 3 (three) times a week.   Livalo 4 MG Tabs Generic drug: Pitavastatin Calcium TAKE 1 TABLET BY MOUTH EVERY DAY   LORazepam 0.5 MG tablet Commonly known as: Ativan Take 1 tablet (0.5 mg total) by mouth at bedtime as needed for anxiety (sleep.). What changed:  when to take this reasons to take this   metoprolol succinate 25 MG 24 hr tablet Commonly known as: TOPROL-XL TAKE 1 TABLET BY MOUTH EVERYDAY AT BEDTIME   mexiletine 150 MG capsule Commonly known as: MEXITIL TAKE 2 CAPSULES (300 MG TOTAL) BY MOUTH 2 (TWO) TIMES DAILY.   multivitamin with minerals Tabs tablet Take 1 tablet by mouth daily.   ondansetron 8 MG disintegrating tablet Commonly known as: ZOFRAN-ODT Take 1 tablet (8 mg total) by mouth every 8 (eight) hours as needed for nausea or vomiting.   OneTouch Verio test strip Generic drug: glucose blood 1 each 3 (three) times  daily.   potassium chloride 10 MEQ tablet Commonly known as: Klor-Con M10 Take 1 tablet (10 mEq total) by mouth 2 (two) times daily. What changed: how much to take   rOPINIRole 0.25 MG tablet Commonly known as: REQUIP Take 0.25 mg by mouth as needed.        Follow-up Information     [facility] Follow up.   Specialty: Home Health Services Why: Agency will call you to set up apt times Contact information: [address] [place] [address] [phone]                Discharge Exam: Filed Weights   05/17/23 0500 05/18/23 0419 05/19/23 0556  Weight: 124.6 kg 124.9 kg 125.6 kg   BP 138/82 (BP Location:  Right Arm)   Pulse 66   Temp 98.8 F (37.1 C) (Oral)   Resp 16   Ht 5\' 6"  (1.676 m)   Wt 125.6 kg   SpO2 97%   BMI 44.69 kg/m   Patient with no chest pain or dyspnea, no PND or orthopnea, she was able to sleep well last night.   Neurology awake and alert ENT with mild pallor Cardiovascular with S1 and S2 present and regular with no gallops, rubs or murmurs Respiratory with no wheezing or rhonchi Abdomen with no distention  No lower extremities edema Left lower extremity rash as resolved.   Condition at discharge: stable  The results of significant diagnostics from this hospitalization (including imaging, microbiology, ancillary and laboratory) are listed below for reference.   Imaging Studies: DG CHEST PORT 1 VIEW  Result Date: 05/14/2023 CLINICAL DATA:  Cough EXAM: PORTABLE CHEST 1 VIEW COMPARISON:  05/12/2023 FINDINGS: Left-sided single lead pacing device. Cardiomegaly. Streaky atelectasis or scarring at left base. No consolidation, pleural effusion, or pneumothorax. Aortic atherosclerosis. IMPRESSION: No active disease. Cardiomegaly. Streaky atelectasis or scarring at left base. Electronically Signed   By: Jasmine Pang M.D.   On: 05/14/2023 23:34   MR Lumbar Spine W Wo Contrast  Result Date: 05/14/2023 CLINICAL DATA:   Lumbar radiculopathy. Infection suspected. Abnormal CT scan. EXAM: MRI LUMBAR SPINE WITHOUT AND WITH CONTRAST TECHNIQUE: Multiplanar and multiecho pulse sequences of the lumbar spine were obtained without and with intravenous contrast. CONTRAST:  10mL GADAVIST GADOBUTROL 1 MMOL/ML IV SOLN COMPARISON:  CT of the lumbar spine 05/12/2023 FINDINGS: Segmentation: 5 non rib-bearing lumbar type vertebral bodies are present. The lowest fully formed vertebral body is L5. Alignment:  Grade 1 retrolisthesis at L2-3, L3-4 and L4-5 is stable. Vertebrae: Focal T2 hyperintensity is present at the anterior aspect of the inferior L1 fracture. Focal enhancement is present as well. Type 2 Modic changes are present at L2-3, L3-4, L4-5 and L5-S1. Marrow signal and vertebral body heights are otherwise normal. Conus medullaris and cauda equina: Conus extends to the L1 level. Conus and cauda equina appear normal. Paraspinal and other soft tissues: Limited imaging the abdomen is unremarkable. There is no significant adenopathy. No solid organ lesions are present. Disc levels: T12-L1: Mild facet hypertrophy is present bilaterally. No focal disc protrusion or stenosis is present. L1-2: A broad-based disc protrusion is present. Moderate facet hypertrophy is present bilaterally. No focal stenosis is present. L2-3: A broad-based disc protrusion is present. Mild facet hypertrophy and ligamentum flavum thickening is noted. Prominent epidural fat is present. This results in crowding of the nerve roots in severe central canal stenosis. Moderate foraminal stenosis is present bilaterally. L3-4: A broad-based disc protrusion is present. Moderate facet hypertrophy and ligamentum flavum thickening is present. Prominent epidural fat contributes 2 crowding of the central nerve roots and severe central canal stenosis. Mild foraminal narrowing is present bilaterally. L4-5: A broad-based disc protrusion is present. Left laminectomy is present. Posterior canal  is decompressed. Nerve roots are clumped. Prominent epidural fat is present. Mild foraminal narrowing is present bilaterally. L5-S1: A broad-based disc protrusion is present. Moderate to severe central canal stenosis is worse on the right. Moderate right foraminal stenosis is present. The postcontrast images demonstrate mild dural enhancement at the L3 level and below. No focal collections are present. IMPRESSION: 1. Focal T2 hyperintensity and enhancement at the anterior aspect of the inferior L1 fracture. This is favored to represent an acute or subacute fracture. 2. Severe central canal stenosis at L2-3  and L3-4 secondary to combination of disc protrusion, facet hypertrophy, ligamentum flavum thickening, and prominent epidural fat. 3. Moderate foraminal stenosis bilaterally at L2-3 and L3-4. 4. Mild foraminal narrowing bilaterally at L4-5. 5. Moderate to severe central canal stenosis at L5-S1 is worse on the right. 6. Moderate right foraminal stenosis at L5-S1. 7. Mild dural enhancement at the L3 level and below is nonspecific, but likely reactive given the significant stenoses at these levels. No focal collections are present to suggest infection. Electronically Signed   By: Marin Roberts M.D.   On: 05/14/2023 15:23   MR THORACIC SPINE W WO CONTRAST  Result Date: 05/14/2023 CLINICAL DATA:  Osteomyelitis, thoracic. EXAM: MRI THORACIC WITHOUT AND WITH CONTRAST TECHNIQUE: Multiplanar and multiecho pulse sequences of the thoracic spine were obtained without and with intravenous contrast. CONTRAST:  10mL GADAVIST GADOBUTROL 1 MMOL/ML IV SOLN COMPARISON:  None Available. FINDINGS: Alignment: No significant listhesis is present in the thoracic spine. Normal thoracic kyphosis is present. Rightward curvature is centered at T6-7. Vertebrae: Chronic type 2 Modic changes are evident at T8-9 T9-10 and T11-12. Schmorl's nodes are present at T7-8, T8-9 and T10-11. The postcontrast images demonstrate no pathologic  enhancement the spine are meninges. Cord:  Normal signal and morphology. Paraspinal and other soft tissues: The paraspinous soft tissues are within normal limits bilaterally. Mild dependent atelectasis is present both lungs. Limited imaging the abdomen is unremarkable. There is no significant adenopathy. No solid organ lesions are present. Disc levels: Shallow disc protrusions are present T4-5 and T6-7 without significant stenosis. Shallow central disc protrusions are present at T9-10 T10-11 and T11-12 without significant stenosis. Bilateral facet hypertrophy contributes 2 right greater than left foraminal narrowing at T9-10, T10-11 and T11-12. The foramina are otherwise patent throughout the thoracic spine. IMPRESSION: 1. No evidence for osteomyelitis or discitis. 2. Shallow disc protrusions at T4-5, T6-7, T9-10, T10-11 and T11-12 without significant stenosis. 3. Bilateral facet hypertrophy contributes to right greater than left foraminal narrowing at T9-10, T10-11 and T11-12. Electronically Signed   By: Marin Roberts M.D.   On: 05/14/2023 15:11   CT ABDOMEN PELVIS WO CONTRAST  Result Date: 05/12/2023 CLINICAL DATA:  Acute nonlocalized abdominal pain, known lumbar spine fracture EXAM: CT ABDOMEN AND PELVIS WITHOUT CONTRAST TECHNIQUE: Multidetector CT imaging of the abdomen and pelvis was performed following the standard protocol without IV contrast. RADIATION DOSE REDUCTION: This exam was performed according to the departmental dose-optimization program which includes automated exposure control, adjustment of the mA and/or kV according to patient size and/or use of iterative reconstruction technique. COMPARISON:  05/12/2023 at 2:55 p.m., 06/12/2016 FINDINGS: Lower chest: Scattered bilateral lower lobe scarring or atelectasis. No acute airspace disease. Mild cardiomegaly, with calcification of the mitral and aortic valves. No pericardial effusion. Hepatobiliary: Small calcified gallstones without  evidence of acute cholecystitis. Unremarkable unenhanced appearance of the liver. No biliary ductal dilation or evidence of choledocholithiasis. Pancreas: Unremarkable unenhanced appearance. Spleen: Unremarkable unenhanced appearance. Adrenals/Urinary Tract: No urinary tract calculi or obstructive uropathy within either kidney. 1.6 cm simple appearing cyst left kidney does not require specific follow-up. 1.4 cm slightly hyperdense lesion within the lateral mid right kidney reference image 26/3, measures 38 HU. This was previously evaluated in 2017, where it measured 1.3 cm demonstrating equivocal enhancement on that exam. Given the lack of significant interval increase in size, this likely reflects a hyperdense cyst. Stable left adrenal adenoma does not require imaging follow-up. The right adrenals unremarkable. The bladder is grossly normal. Stomach/Bowel: No evidence of high-grade  bowel obstruction. Scattered gas fluid levels are seen throughout the normal caliber small bowel, which could reflect sequela of gastroenteritis. No bowel wall thickening or inflammatory change. Normal retrocecal appendix. Vascular/Lymphatic: Aortic atherosclerosis. No enlarged abdominal or pelvic lymph nodes. Reproductive: Uterus and bilateral adnexa are unremarkable. Other: No free fluid or free intraperitoneal gas. There is a small right para umbilical Richter type hernia, containing a portion of the ventral wall of the mid transverse colon. Abdominal wall defect measures 2.6 x 2.2 cm. No evidence of incarceration or bowel wall ischemia. Musculoskeletal: No acute or destructive bony abnormalities. Stable appearance of the chronic inferior endplate compression deformity at L1. Stable diffuse multilevel lumbar spondylosis and facet hypertrophy as previously outlined on CT lumbar spine performed earlier today. Reconstructed images demonstrate no additional findings. IMPRESSION: 1. Scattered gas fluid levels throughout the nondistended  small bowel, which may reflect sequela of gastroenteritis. No evidence of high-grade bowel obstruction. 2. Cholelithiasis without cholecystitis. 3. Indeterminate 1.4 cm slightly hyperdense right renal lesion, favor hyperdense cyst given the lack of significant change since 2017. Simple cyst left kidney. No specific imaging follow-up recommended. 4. Small right para umbilical Richter hernia containing a portion of the anti mesenteric wall of the mid transverse colon. No evidence of bowel incarceration or ischemia. 5.  Aortic Atherosclerosis (ICD10-I70.0). 6. Chronic L1 compression deformity and severe multilevel lumbar spondylosis and facet hypertrophy. Please see preceding CT lumbar spine for full description of findings. Electronically Signed   By: Sharlet Salina M.D.   On: 05/12/2023 19:44   DG Chest Portable 1 View  Result Date: 05/12/2023 CLINICAL DATA:  Sepsis EXAM: PORTABLE CHEST 1 VIEW COMPARISON:  04/20/2021 FINDINGS: Left-sided single lead pacing device as before. Hypoventilatory changes. Cardiomegaly with vascular congestion and mild interstitial edema. No focal consolidation, pleural effusion or pneumothorax. IMPRESSION: Cardiomegaly with vascular congestion and mild interstitial edema. Electronically Signed   By: Jasmine Pang M.D.   On: 05/12/2023 19:01   CT Lumbar Spine Wo Contrast  Result Date: 05/12/2023 CLINICAL DATA:  Lumbar compression fracture EXAM: CT LUMBAR SPINE WITHOUT CONTRAST TECHNIQUE: Multidetector CT imaging of the lumbar spine was performed without intravenous contrast administration. Multiplanar CT image reconstructions were also generated. RADIATION DOSE REDUCTION: This exam was performed according to the departmental dose-optimization program which includes automated exposure control, adjustment of the mA and/or kV according to patient size and/or use of iterative reconstruction technique. COMPARISON:  12/02/2022 CT scan FINDINGS: Segmentation: The lowest lumbar type  non-rib-bearing vertebra is labeled as L5. Alignment: No vertebral subluxation is observed. Vertebrae: 50% inferior endplate compression fracture at L1 with 1-2 mm of posterior bony retropulsion. This fracture was also visible on 12/02/2022. Minimal sclerosis along the compression fracture margin. No substantial increase in collapse compared to the prior exam. 3 mm degenerative retrolisthesis L2-3 and L4-5. Intervertebral vacuum disc phenomenon at all levels between T11 and S1, although only minimally at T12-L1. Loss of disc height at all levels between T11 and S1 except for T12-L1 and L1-2. New or acute compression fracture is observed. Paraspinal and other soft tissues: Atherosclerosis is present, including aortoiliac atherosclerotic disease. 1.6 cm in short axis left adrenal mass, internal density 1.8 Hounsfield units, compatible with adenoma. No further imaging workup of this lesion is indicated. Disc levels: T12-L1: Unremarkable L1-2: Moderate central narrowing of the thecal sac due to posterior bony retropulsion, intervertebral spurring, and disc bulge. L2-3: Prominent central narrowing of the thecal sac and at least mild bilateral subarticular lateral recess stenosis due to disc osteophyte  complex, short pedicles, and facet arthropathy. L3-4: Prominent central narrowing of the thecal sac with mild bilateral foraminal stenosis and at least moderate bilateral subarticular lateral recess stenosis due to disc osteophyte complex, facet arthropathy, and short pedicles. L4-5: Moderate to prominent central narrowing of the thecal sac with mild right and borderline left foraminal stenosis due to facet arthropathy, disc osteophyte complex, and short pedicles. Possible right laminectomy at this level in past. L5-S1: Prominent right and mild left foraminal stenosis and at least mild central narrowing of the thecal sac due to prominent right facet spurring and short pedicles. IMPRESSION: 1. 50% inferior endplate  compression fracture at L1 with 1-2 mm of posterior bony retropulsion. This fracture was also visible on 12/02/2022. No substantial increase in collapse compared to the prior exam. 2. Lumbar spondylosis, congenitally short pedicles, and degenerative disc disease, causing prominent impingement at L2-3, L3-4, and L5-S1; moderate to prominent impingement at L4-5; and moderate impingement at L1-2. 3. Prominent right foraminal stenosis at L5-S1. 4. Aortic atherosclerosis. 5. 1.6 cm in short axis left adrenal adenoma. No further imaging workup of this lesion is indicated. Aortic Atherosclerosis (ICD10-I70.0). Electronically Signed   By: Gaylyn Rong M.D.   On: 05/12/2023 16:06    Microbiology: Results for orders placed or performed during the hospital encounter of 05/12/23  Culture, blood (single)     Status: None   Collection Time: 05/12/23  3:57 PM   Specimen: BLOOD  Result Value Ref Range Status   Specimen Description BLOOD SITE NOT SPECIFIED  Final   Special Requests   Final    BOTTLES DRAWN AEROBIC AND ANAEROBIC Blood Culture adequate volume   Culture   Final    NO GROWTH 5 DAYS Performed at [facility], [address] [place], [place] [address]    Report Status 05/17/2023 FINAL  Final  Culture, blood (single)     Status: None   Collection Time: 05/12/23  4:16 PM   Specimen: BLOOD  Result Value Ref Range Status   Specimen Description BLOOD SITE NOT SPECIFIED  Final   Special Requests   Final    BOTTLES DRAWN AEROBIC AND ANAEROBIC Blood Culture adequate volume   Culture   Final    NO GROWTH 5 DAYS Performed at [facility], [address] [place], [place] [address]    Report Status 05/17/2023 FINAL  Final  MRSA Next Gen by PCR, Nasal     Status: Abnormal   Collection Time: 05/13/23  3:19 PM   Specimen: Nasal Mucosa; Nasal Swab  Result Value Ref Range Status   MRSA by PCR Next Gen DETECTED (A) NOT DETECTED Final    Comment: RESULT CALLED TO, READ BACK BY AND  VERIFIED WITH: A HODGES,RN@2155  05/13/23 MK (NOTE) The GeneXpert MRSA Assay (FDA approved for NASAL specimens only), is one component of a comprehensive MRSA colonization surveillance program. It is not intended to diagnose MRSA infection nor to guide or monitor treatment for MRSA infections. Test performance is not FDA approved in patients less than 78 years old. Performed at [facility], [address] [place], [place] [address]   SARS Coronavirus 2 by RT PCR (hospital order, performed in [facility] hospital lab) *cepheid single result test* Anterior Nasal Swab     Status: None   Collection Time: 05/14/23  8:01 PM   Specimen: Anterior Nasal Swab  Result Value Ref Range Status   SARS Coronavirus 2 by RT PCR NEGATIVE NEGATIVE Final  Comment: Performed at [facility], [address], [place], [place] [address]  Respiratory (~20 pathogens) panel by PCR     Status: None   Collection Time: 05/14/23  8:01 PM   Specimen: Nasopharyngeal Swab; Respiratory  Result Value Ref Range Status   Adenovirus NOT DETECTED NOT DETECTED Final   Coronavirus 229E NOT DETECTED NOT DETECTED Final    Comment: (NOTE) The Coronavirus on the Respiratory Panel, DOES NOT test for the novel  Coronavirus (2019 nCoV)    Coronavirus HKU1 NOT DETECTED NOT DETECTED Final   Coronavirus NL63 NOT DETECTED NOT DETECTED Final   Coronavirus OC43 NOT DETECTED NOT DETECTED Final   Metapneumovirus NOT DETECTED NOT DETECTED Final   Rhinovirus / Enterovirus NOT DETECTED NOT DETECTED Final   Influenza A NOT DETECTED NOT DETECTED Final   Influenza B NOT DETECTED NOT DETECTED Final   Parainfluenza Virus 1 NOT DETECTED NOT DETECTED Final   Parainfluenza Virus 2 NOT DETECTED NOT DETECTED Final   Parainfluenza Virus 3 NOT DETECTED NOT DETECTED Final   Parainfluenza Virus 4 NOT DETECTED NOT DETECTED Final   Respiratory Syncytial Virus NOT DETECTED NOT DETECTED Final   Bordetella pertussis NOT DETECTED NOT DETECTED  Final   Bordetella Parapertussis NOT DETECTED NOT DETECTED Final   Chlamydophila pneumoniae NOT DETECTED NOT DETECTED Final   Mycoplasma pneumoniae NOT DETECTED NOT DETECTED Final    Comment: Performed at [facility], [address], [place], [place] [address]    Labs: CBC: Recent Labs  Lab 05/13/23 0246 05/14/23 0219 05/15/23 0225 05/16/23 0215 05/17/23 0225  WBC 14.7* 12.4* 8.8 6.4 6.5  HGB 16.7* 13.7 13.3 13.4 12.9  HCT 49.4* 42.4 40.4 40.6 39.6  MCV 91.5 92.0 92.2 92.3 91.7  PLT 159 133* 135* 151 160   Basic Metabolic Panel: Recent Labs  Lab 05/13/23 0246 05/14/23 0219 05/15/23 0225 05/16/23 0215 05/17/23 0225 05/18/23 0221  NA 132* 131* 133* 138 135 134*  K 3.8 4.4 3.7 3.6 3.7 3.9  CL 98 99 100 102 100 96*  CO2 20* 19* 22 25 24 24   GLUCOSE 207* 144* 147* 110* 117* 107*  BUN 21 35* 31* 27* 25* 23  CREATININE 1.65* 1.82* 1.47* 1.18* 1.24* 1.14*  CALCIUM 7.7* 7.6* 7.4* 7.5* 7.5* 7.8*  MG 1.8 2.0  --   --   --   --   PHOS  --   --   --   --   --  2.5   Liver Function Tests: Recent Labs  Lab 05/13/23 0246 05/14/23 0219 05/15/23 0225 05/16/23 0215 05/18/23 0221  AST 57* 50* 41 37  --   ALT 30 27 26 24   --   ALKPHOS 42 35* 33* 33*  --   BILITOT 1.1 0.9 0.7 0.7  --   PROT 6.4* 5.7* 5.3* 5.5*  --   ALBUMIN 3.0* 2.5* 2.3* 2.3* 2.7*   CBG: Recent Labs  Lab 05/18/23 0627 05/18/23 1110 05/18/23 1551 05/18/23 2103 05/19/23 0603  GLUCAP 132* 159* 129* 182* 115*    Discharge time spent: greater than 30 minutes.  Signed: Coralie Keens, MD [facility] 05/19/2023

## 2023-05-19 NOTE — TOC Transition Note (Signed)
Transition of Care [facility]) - CM/SW Discharge Note   Patient Details  Name: Heidi Stark MRN: [id number] Date of Birth: 1940-10-30  Transition of Care [facility]) CM/SW Contact:  Ronny Bacon, RN Phone Number: 05/19/2023, 9:32 AM   Clinical Narrative:  Patient is being discharged, Message left with [facility] from [place] to make him aware.     Final next level of care: Home w Home Health Services Barriers to Discharge: No Barriers Identified   Patient Goals and CMS Choice CMS Medicare.gov Compare Post Acute Care list provided to:: Patient Choice offered to / list presented to : Patient  Discharge Placement                         Discharge Plan and Services Additional resources added to the After Visit Summary for   In-house Referral: NA Discharge Planning Services: CM Consult Post Acute Care Choice: Home Health          DME Arranged: N/A         HH Arranged: NA          Social Determinants of Health (SDOH) Interventions SDOH Screenings   Food Insecurity: No Food Insecurity (05/16/2023)  Housing: Low Risk  (05/16/2023)  Transportation Needs: No Transportation Needs (05/16/2023)  Utilities: Not At Risk (05/16/2023)  Alcohol Screen: Low Risk  (10/13/2021)  Depression (PHQ2-9): Low Risk  (10/13/2021)  Financial Resource Strain: Medium Risk (10/13/2021)  Physical Activity: Insufficiently Active (10/13/2021)  Social Connections: Socially Integrated (10/13/2021)  Stress: No Stress Concern Present (10/13/2021)  Tobacco Use: Medium Risk (05/17/2023)     Readmission Risk Interventions     No data to display

## 2023-05-21 ENCOUNTER — Encounter (HOSPITAL_COMMUNITY): Payer: Self-pay | Admitting: Cardiology

## 2023-05-22 NOTE — Telephone Encounter (Signed)
Please confirm amio dose

## 2023-05-23 ENCOUNTER — Telehealth: Payer: Self-pay

## 2023-05-24 NOTE — Patient Outreach (Signed)
  Care Management  Transitions of Care Program Transitions of Care Post-discharge Call  05/23/2023 Name: Heidi Stark MRN: [id number] DOB: 1941-05-22  Subjective: Heidi Stark is a 82 y.o. year old female who is a primary care patient of Donato Schultz, DO. The Care Management team spoke with patient by telephone to assess and address transitions of care needs.   Plan: No further outreach attempts will be made at this time.  Patient has declined enrollment in the Transition of Care 30 day program.  Wyline Mood BSN, RN RN Care Manager   Transitions of Care [facility] - [facility] Direct Dial Number:  [phone]

## 2023-06-01 NOTE — Patient Outreach (Signed)
  Care Coordination   F/U From Initial TOC Call  Visit Note   06/01/2023 Name: GWENDOLYNN RAAK MRN: [id number] DOB: 1941-04-30  LAIAH LIAN is a 82 y.o. year old female who sees Zola Button, Grayling Congress, DO for primary care. I spoke with  Eula Listen by phone today.  What matters to the patients health and wellness today?  To ensure she is able to manage her pain using her current pain medications as prescribed in adjunct to her physical therapy.  Patient reports following up with Cardiologist to be sure she resumes her heart medications at the correct dose.  [facility] was in the home during my call and she felt she was in good hands and did not require additional follow-up from the [facility] program.  SDOH assessments and interventions completed: NO, was completed at time of initial TOC call.  No needs identified.   Completed at time of the initial TOC call on    Care Coordination Interventions:  Yes, provided- [facility] PT services- confirmed active; encouraged patient's active participation/ engagement.  Follow up plan: No further intervention required.   Encounter Outcome:  Patient Visit Completed   Jebediah Macrae Daphine Deutscher BSN, RN RN Care Manager   Transitions of Care [facility] - [facility] Direct Dial Number:  [phone]

## 2023-06-01 NOTE — Care Management Note (Signed)
Opened in error

## 2023-06-08 ENCOUNTER — Other Ambulatory Visit (HOSPITAL_COMMUNITY): Payer: Self-pay | Admitting: Cardiology

## 2023-06-22 ENCOUNTER — Ambulatory Visit (INDEPENDENT_AMBULATORY_CARE_PROVIDER_SITE_OTHER): Payer: Medicare Other

## 2023-06-22 DIAGNOSIS — I472 Ventricular tachycardia, unspecified: Secondary | ICD-10-CM

## 2023-06-25 LAB — CUP PACEART REMOTE DEVICE CHECK
Battery Remaining Longevity: 114 mo
Battery Voltage: 3.01 V
Brady Statistic RV Percent Paced: 0.3 %
Date Time Interrogation Session: 20241206221301
HighPow Impedance: 80 Ohm
Implantable Lead Connection Status: 753985
Implantable Lead Implant Date: 20220302
Implantable Lead Location: 753860
Implantable Pulse Generator Implant Date: 20220302
Lead Channel Impedance Value: 1235 Ohm
Lead Channel Impedance Value: 1349 Ohm
Lead Channel Pacing Threshold Amplitude: 2.5 V
Lead Channel Pacing Threshold Pulse Width: 0.4 ms
Lead Channel Sensing Intrinsic Amplitude: 9.875 mV
Lead Channel Sensing Intrinsic Amplitude: 9.875 mV
Lead Channel Setting Pacing Amplitude: 5 V
Lead Channel Setting Pacing Pulse Width: 0.4 ms
Lead Channel Setting Sensing Sensitivity: 0.3 mV
Zone Setting Status: 755011
Zone Setting Status: 755011

## 2023-07-10 IMAGING — CR DG CHEST 2V
2 series · 2 of 2 positions shown · non-contrast
Comparison: September 16, 2020

CLINICAL DATA: Shortness of breath x2 days.

EXAM:
CHEST - 2 VIEW

[chest pa]
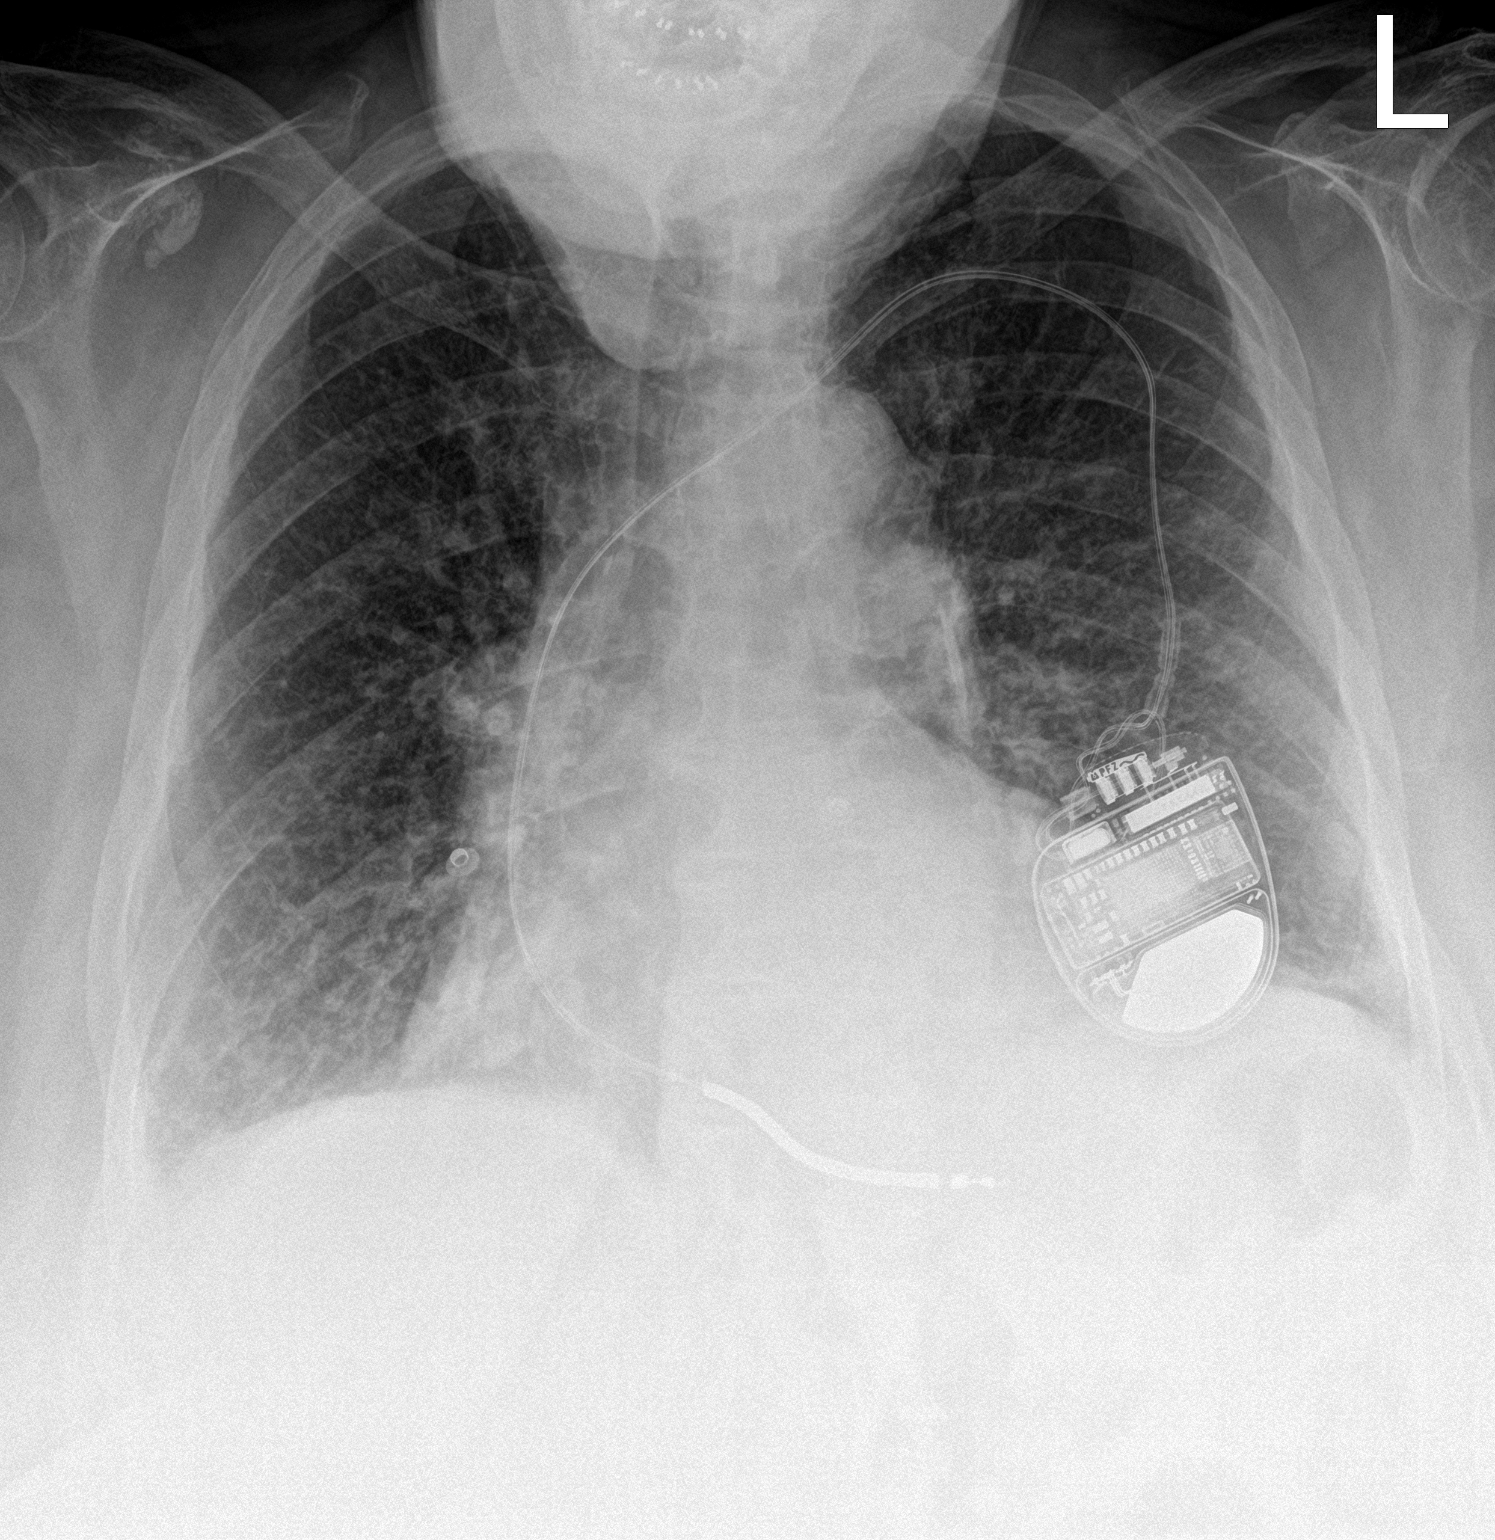

[chest lat]
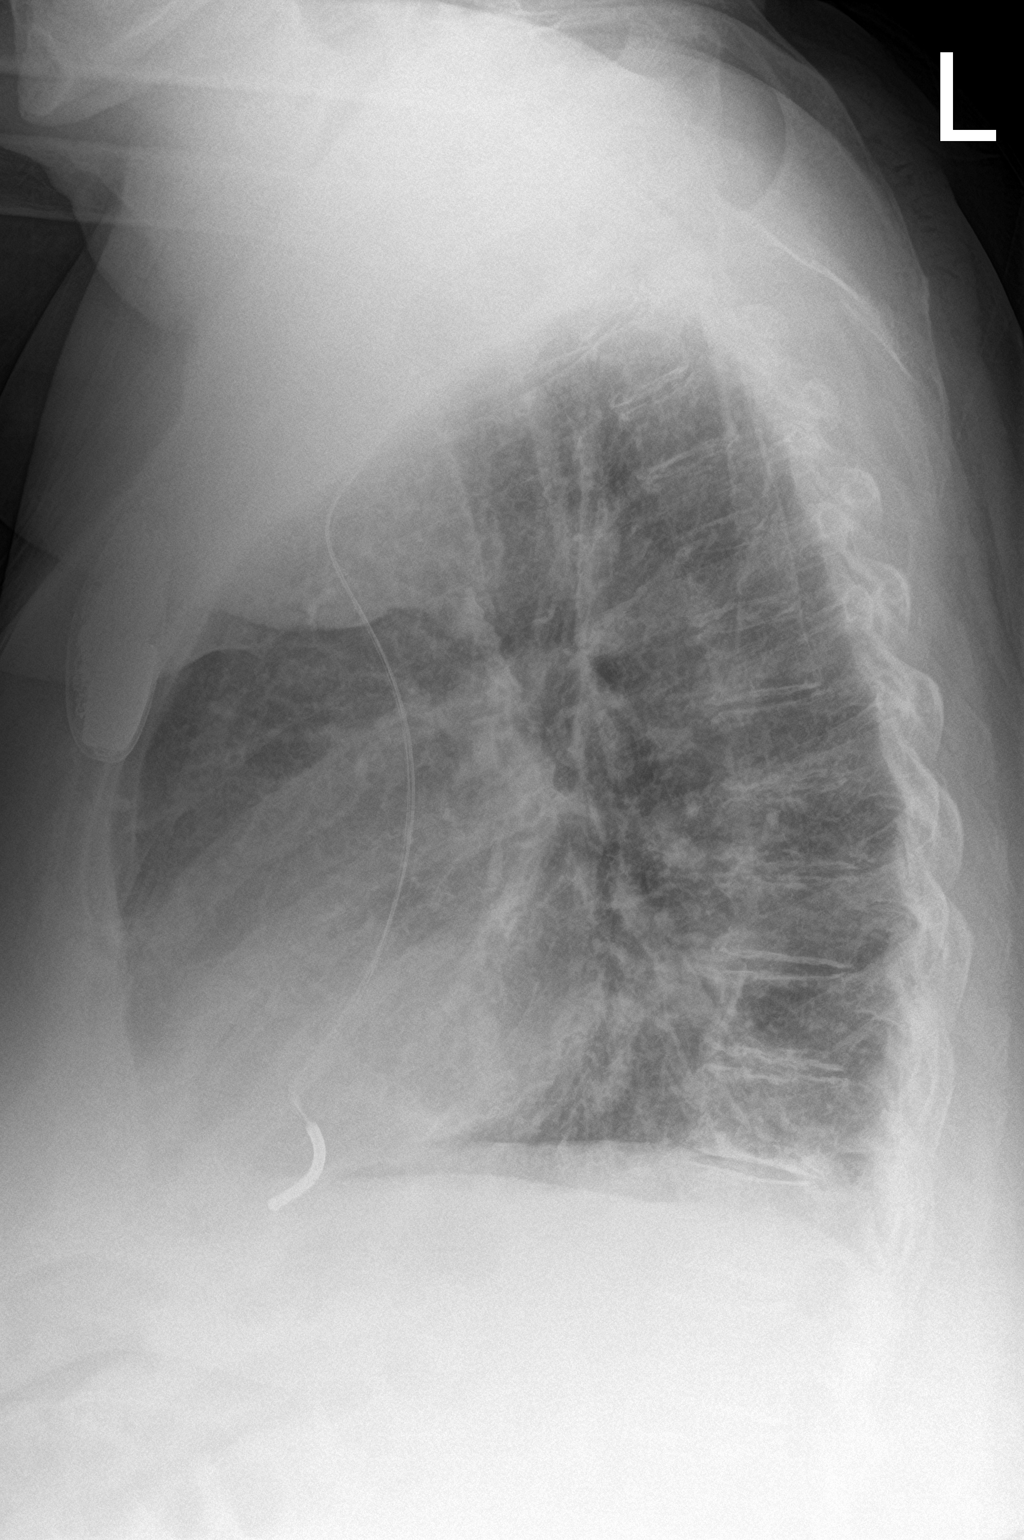

[2 of 2 positions shown; findings below may reference images not displayed]

FINDINGS: A single lead ventricular pacer is noted. Mild, diffuse, chronic
appearing increased interstitial lung markings are noted. There is
no evidence of acute infiltrate, pleural effusion or pneumothorax.
The cardiac silhouette is moderately enlarged and unchanged in size.
The visualized skeletal structures are unremarkable.
IMPRESSION: Stable cardiomegaly with chronic appearing increased interstitial
lung markings.

## 2023-07-11 IMAGING — DX DG CHEST 1V PORT
1 series · 1 of 1 positions shown · non-contrast
Comparison: 04/16/2019

CLINICAL DATA: Shortness of breath

EXAM:
PORTABLE CHEST 1 VIEW

[chest]
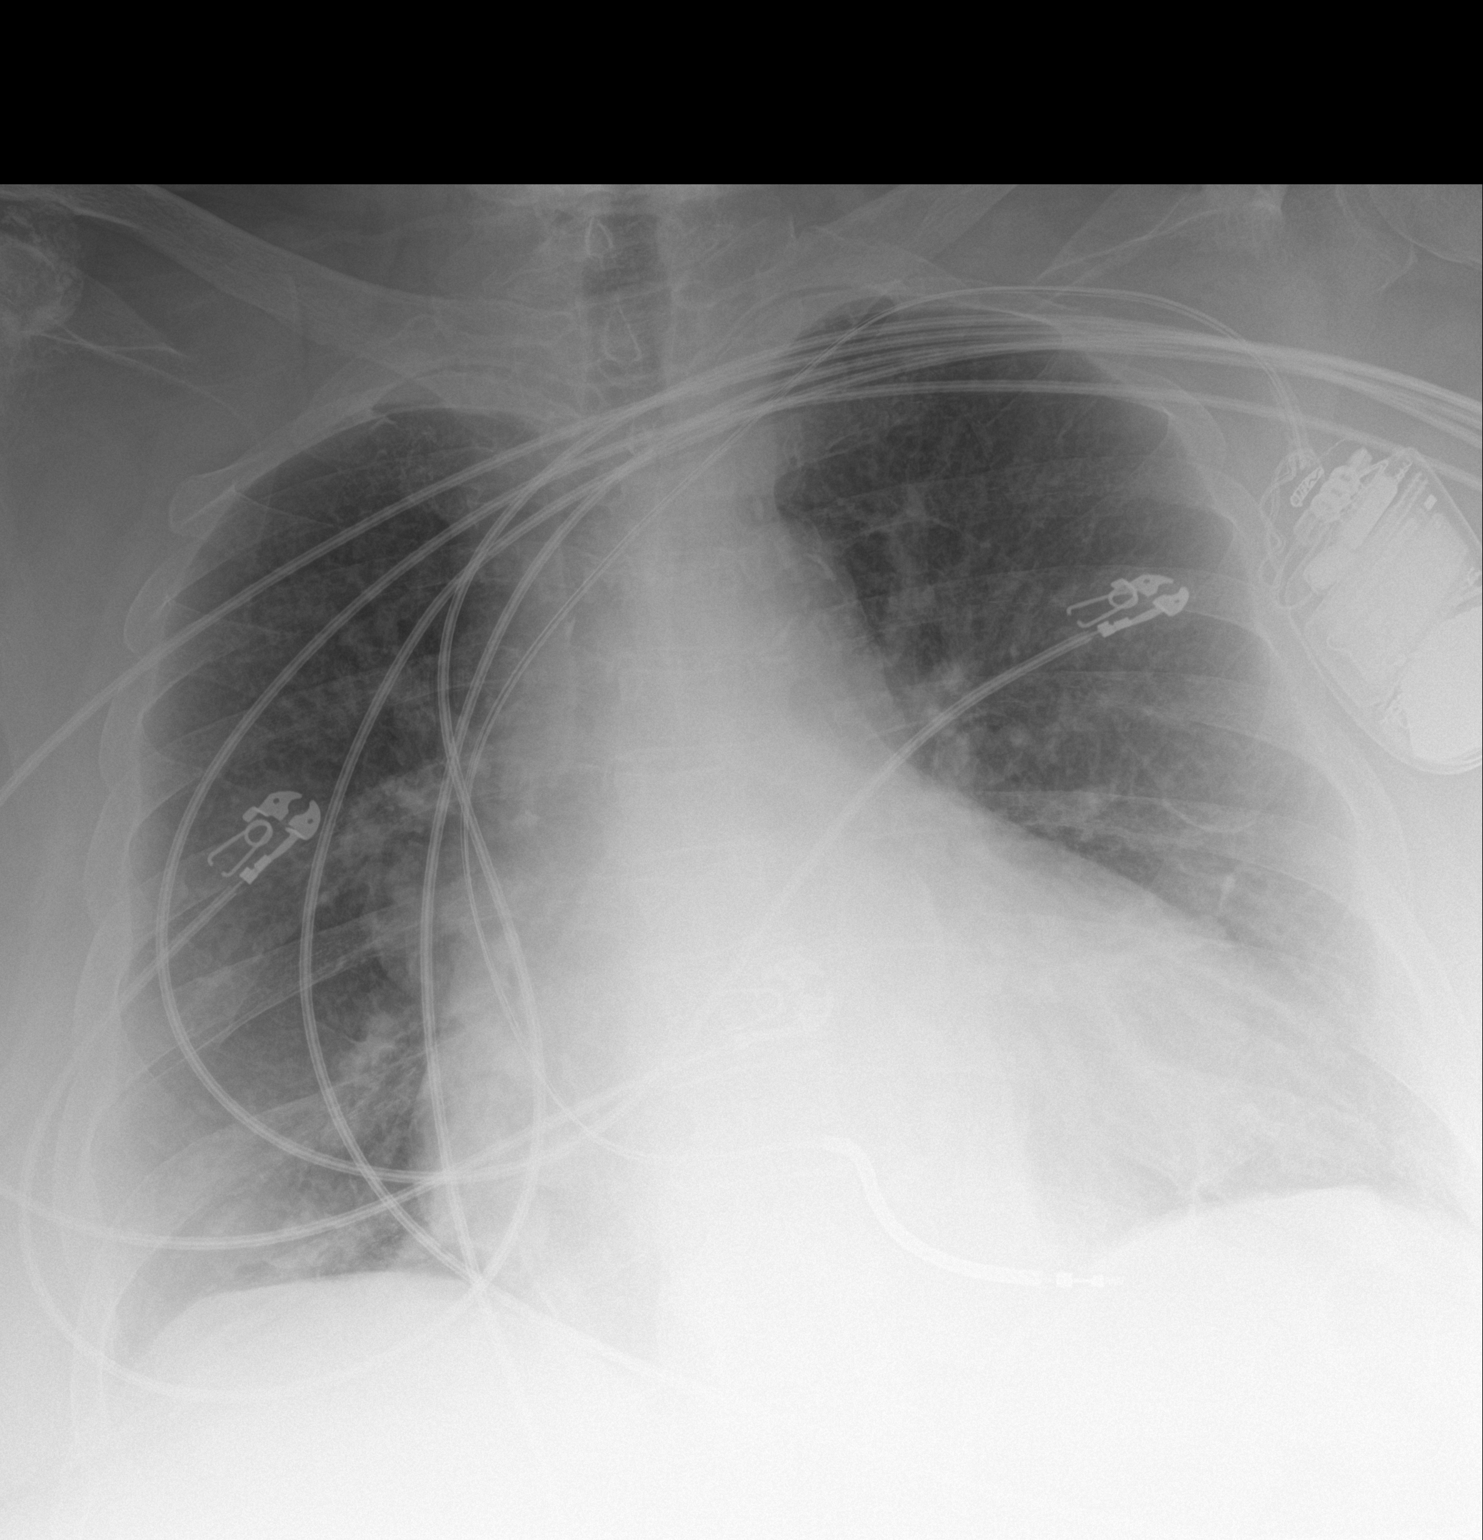

[1 of 1 positions shown; findings below may reference images not displayed]

FINDINGS: Cardiomegaly. Single chamber pacer lead into the right ventricle.
Artifact from EKG leads. Mild interstitial coarsening. Airway
thickening and paraseptal emphysema by 0076 chest CT.
IMPRESSION: Baseline appearance of the chest.  No acute finding.

## 2023-07-12 IMAGING — DX DG CHEST 1V PORT
1 series · 1 of 1 positions shown · non-contrast
Comparison: April 16, 2021

CLINICAL DATA: Shortness of breath

EXAM:
PORTABLE CHEST 1 VIEW

[chest ap]
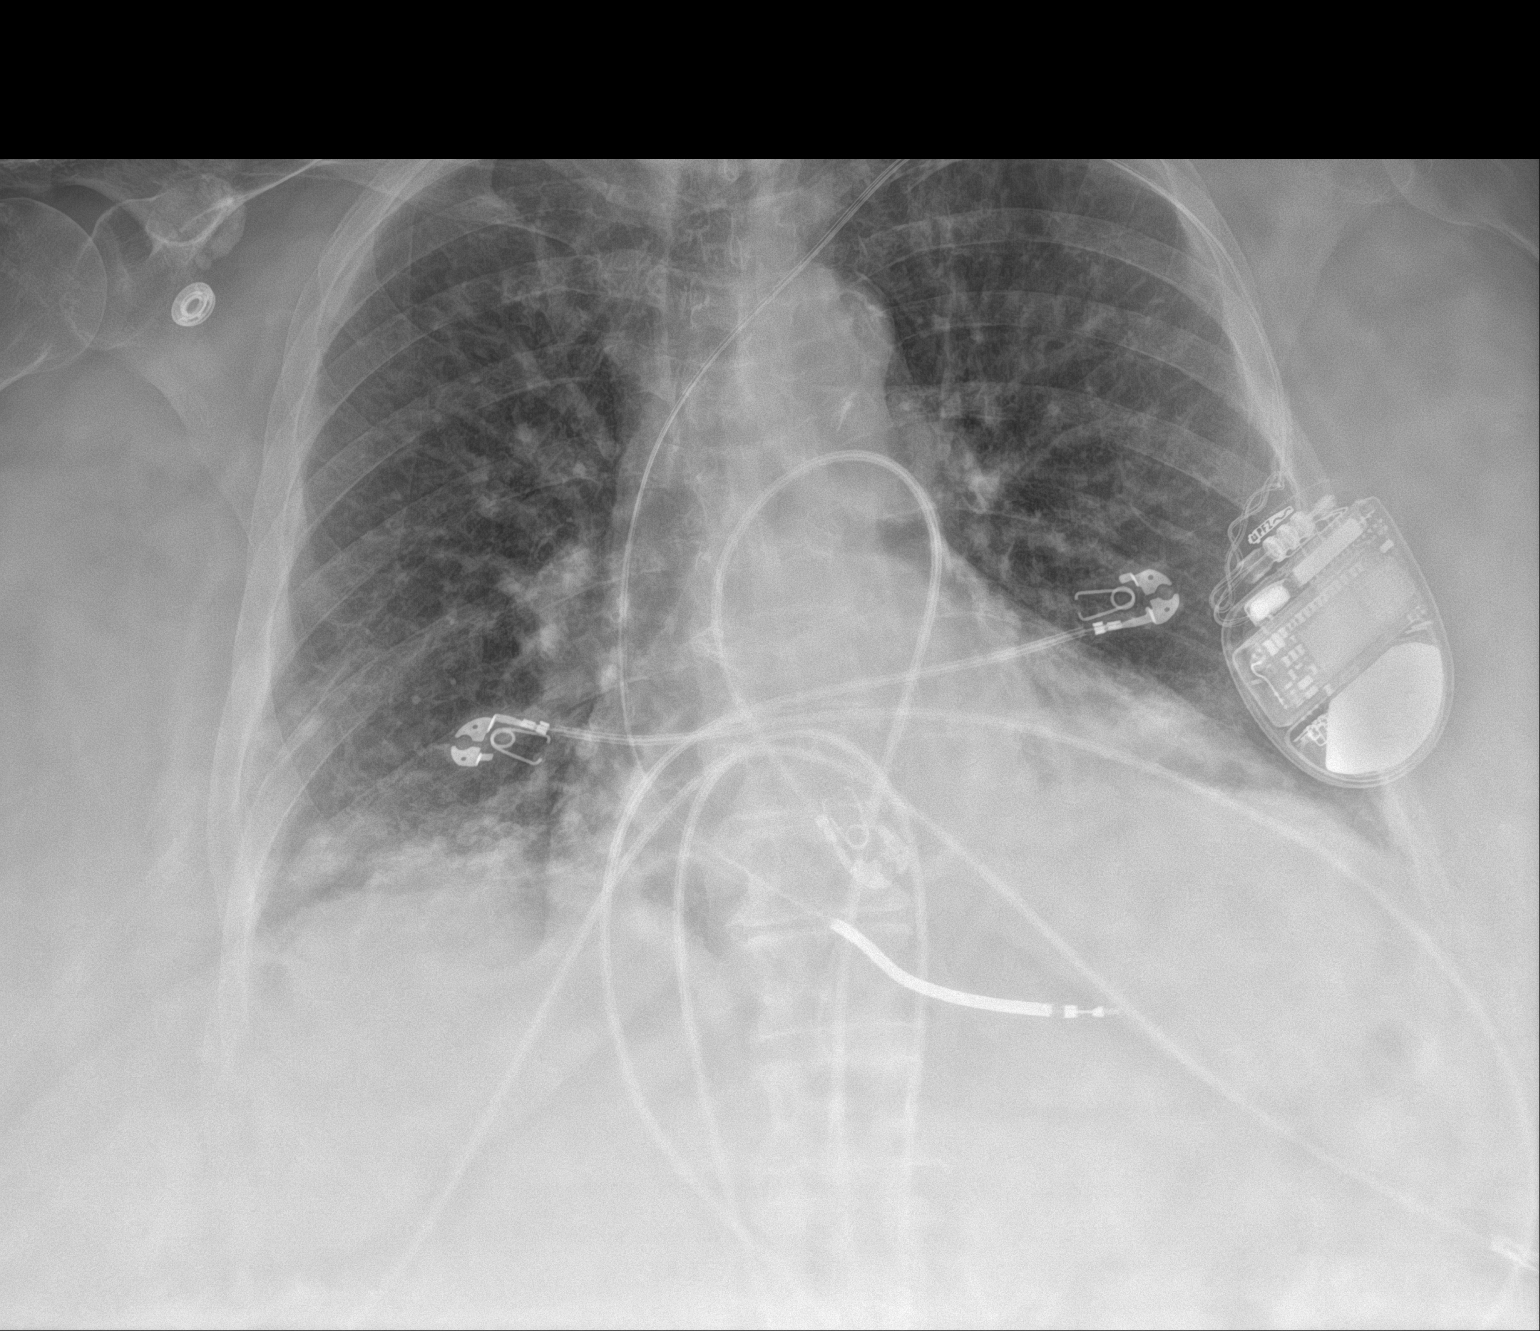

[1 of 1 positions shown; findings below may reference images not displayed]

FINDINGS: Stable cardiomegaly. Stable AICD device. The hila and mediastinum
are unremarkable. No pneumothorax. Diffuse mild increased
interstitial opacities. More focal mild opacities in the bases. No
other acute abnormalities.
IMPRESSION: 1. Findings are most suggestive of cardiomegaly with mild edema. The
more focal opacity in the right base may represent atelectasis or
developing infiltrate. Recommend attention on follow-up.

## 2023-07-13 ENCOUNTER — Ambulatory Visit: Payer: Medicare Other | Admitting: Family Medicine

## 2023-07-13 ENCOUNTER — Telehealth: Payer: Self-pay

## 2023-07-13 NOTE — Telephone Encounter (Signed)
Alert remote transmission: AF >+ 6 hr for 1 days. 3 AF episodes, longest 20 hr 40 min on 07/12/23, most recent on 07/12/23 at 23:10 of ongoing duration, V rates are controlled, burden 4.7%, on Eliquis per Epic. Routed to clinic for ongoing AF. Heart Failure diagnostics currently abnormal.  Follow up as scheduled. MC, CVRS   Pt asymptomatic. Optivol elevated as well. Reports currently taking 80mg  lasix BID. F/u w/ cards APP in next 2 weeks per WC. Sent to scheduling.

## 2023-07-15 ENCOUNTER — Encounter (HOSPITAL_COMMUNITY): Payer: Self-pay | Admitting: Cardiology

## 2023-07-15 IMAGING — DX DG CHEST 1V PORT
1 series · 1 of 1 positions shown · non-contrast
Comparison: Chest radiograph 04/17/2021

CLINICAL DATA: Shortness of breath

EXAM:
PORTABLE CHEST 1 VIEW

[chest]
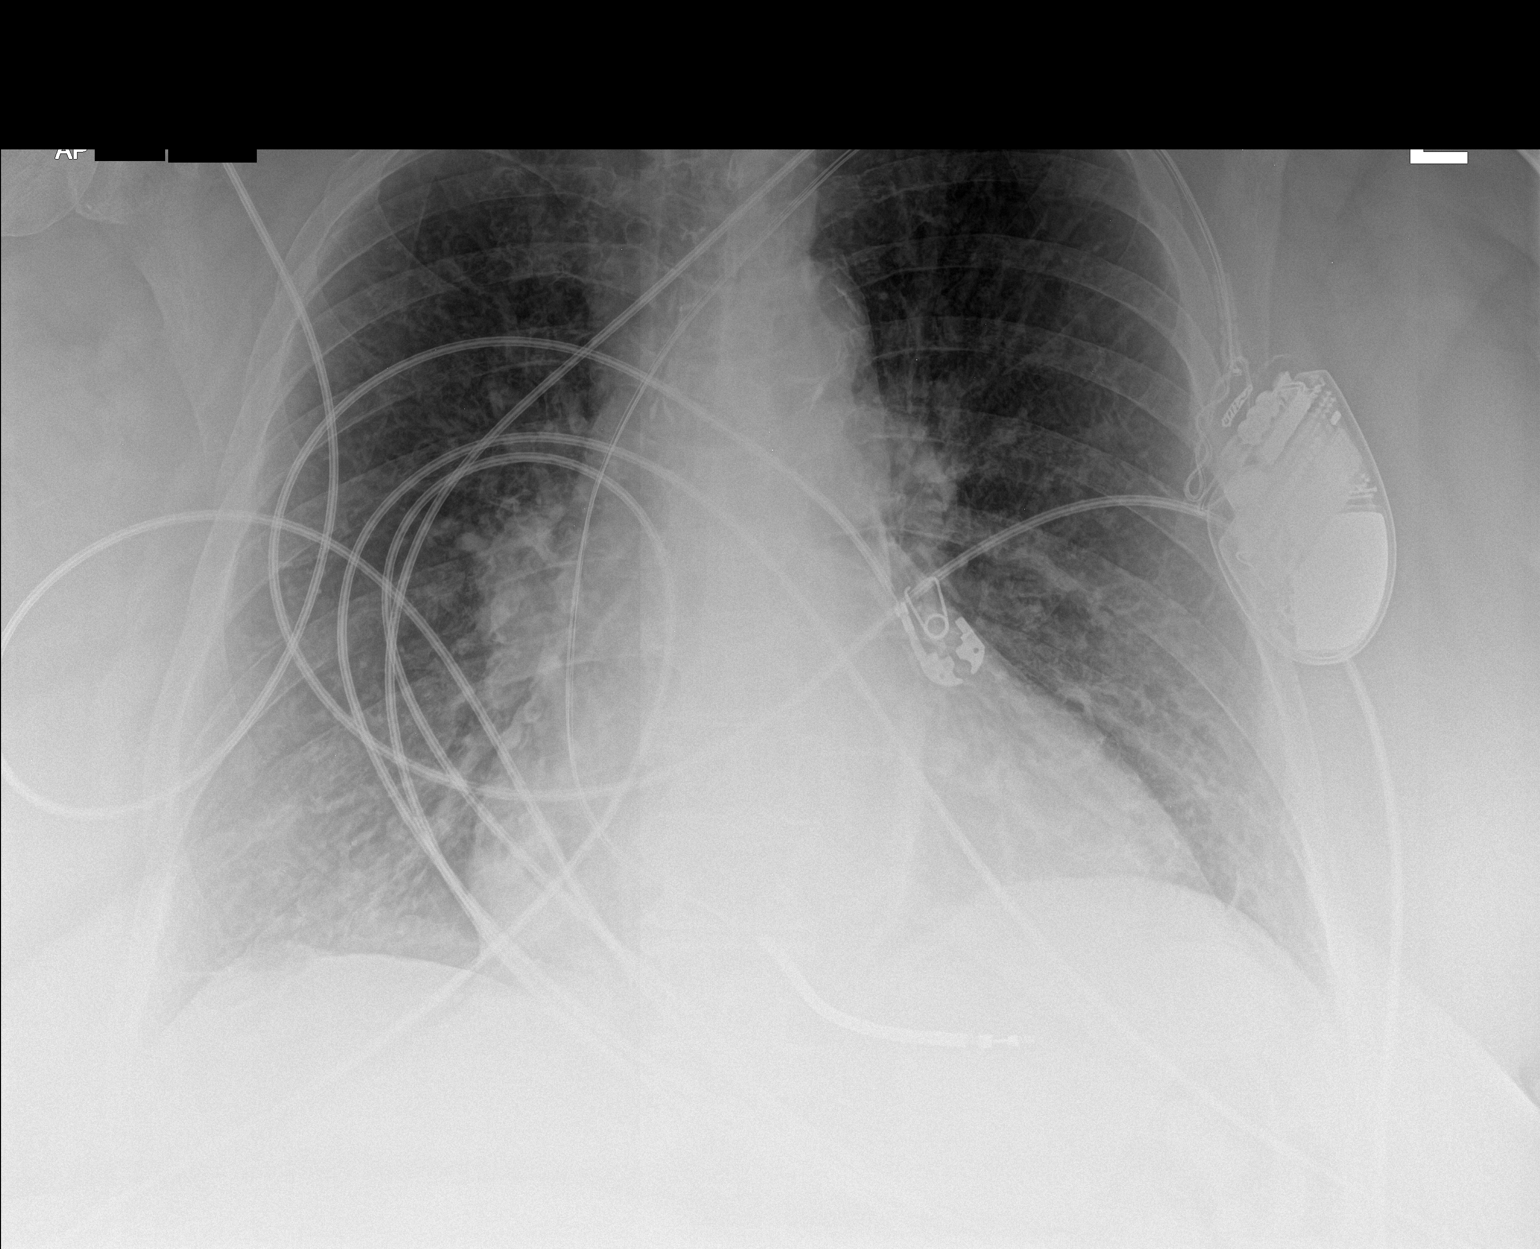

[1 of 1 positions shown; findings below may reference images not displayed]

FINDINGS: A left chest wall cardiac device and single lead are stable.

The heart is mildly enlarged, unchanged. The mediastinal contours
are stable.

There is mild vascular congestion without definite overt pulmonary
edema. Aeration of the lung bases has significantly improved. There
is no new focal airspace disease. There is no significant pleural
effusion. There is no pneumothorax.

The bones are stable.
IMPRESSION: Improved pulmonary edema with improved aeration of the lung bases
since 04/17/2021

## 2023-07-19 DIAGNOSIS — I4892 Unspecified atrial flutter: Secondary | ICD-10-CM | POA: Diagnosis not present

## 2023-07-19 DIAGNOSIS — M5136 Other intervertebral disc degeneration, lumbar region with discogenic back pain only: Secondary | ICD-10-CM | POA: Diagnosis not present

## 2023-07-19 DIAGNOSIS — I130 Hypertensive heart and chronic kidney disease with heart failure and stage 1 through stage 4 chronic kidney disease, or unspecified chronic kidney disease: Secondary | ICD-10-CM | POA: Diagnosis not present

## 2023-07-19 DIAGNOSIS — M47816 Spondylosis without myelopathy or radiculopathy, lumbar region: Secondary | ICD-10-CM | POA: Diagnosis not present

## 2023-07-19 DIAGNOSIS — N1832 Chronic kidney disease, stage 3b: Secondary | ICD-10-CM | POA: Diagnosis not present

## 2023-07-19 DIAGNOSIS — G8929 Other chronic pain: Secondary | ICD-10-CM | POA: Diagnosis not present

## 2023-07-19 DIAGNOSIS — I700 Atherosclerosis of aorta: Secondary | ICD-10-CM | POA: Diagnosis not present

## 2023-07-19 DIAGNOSIS — J449 Chronic obstructive pulmonary disease, unspecified: Secondary | ICD-10-CM | POA: Diagnosis not present

## 2023-07-19 DIAGNOSIS — I440 Atrioventricular block, first degree: Secondary | ICD-10-CM | POA: Diagnosis not present

## 2023-07-19 DIAGNOSIS — E1122 Type 2 diabetes mellitus with diabetic chronic kidney disease: Secondary | ICD-10-CM | POA: Diagnosis not present

## 2023-07-19 DIAGNOSIS — M48061 Spinal stenosis, lumbar region without neurogenic claudication: Secondary | ICD-10-CM | POA: Diagnosis not present

## 2023-07-19 DIAGNOSIS — M19041 Primary osteoarthritis, right hand: Secondary | ICD-10-CM | POA: Diagnosis not present

## 2023-07-19 DIAGNOSIS — I4510 Unspecified right bundle-branch block: Secondary | ICD-10-CM | POA: Diagnosis not present

## 2023-07-19 DIAGNOSIS — E039 Hypothyroidism, unspecified: Secondary | ICD-10-CM | POA: Diagnosis not present

## 2023-07-19 DIAGNOSIS — M4807 Spinal stenosis, lumbosacral region: Secondary | ICD-10-CM | POA: Diagnosis not present

## 2023-07-19 DIAGNOSIS — M4856XD Collapsed vertebra, not elsewhere classified, lumbar region, subsequent encounter for fracture with routine healing: Secondary | ICD-10-CM | POA: Diagnosis not present

## 2023-07-19 DIAGNOSIS — M19031 Primary osteoarthritis, right wrist: Secondary | ICD-10-CM | POA: Diagnosis not present

## 2023-07-19 DIAGNOSIS — I472 Ventricular tachycardia, unspecified: Secondary | ICD-10-CM | POA: Diagnosis not present

## 2023-07-19 DIAGNOSIS — M19032 Primary osteoarthritis, left wrist: Secondary | ICD-10-CM | POA: Diagnosis not present

## 2023-07-19 DIAGNOSIS — D3502 Benign neoplasm of left adrenal gland: Secondary | ICD-10-CM | POA: Diagnosis not present

## 2023-07-19 DIAGNOSIS — I5033 Acute on chronic diastolic (congestive) heart failure: Secondary | ICD-10-CM | POA: Diagnosis not present

## 2023-07-19 DIAGNOSIS — I4819 Other persistent atrial fibrillation: Secondary | ICD-10-CM | POA: Diagnosis not present

## 2023-07-19 DIAGNOSIS — I2510 Atherosclerotic heart disease of native coronary artery without angina pectoris: Secondary | ICD-10-CM | POA: Diagnosis not present

## 2023-07-19 DIAGNOSIS — M5137 Other intervertebral disc degeneration, lumbosacral region with discogenic back pain only: Secondary | ICD-10-CM | POA: Diagnosis not present

## 2023-07-19 DIAGNOSIS — M19042 Primary osteoarthritis, left hand: Secondary | ICD-10-CM | POA: Diagnosis not present

## 2023-07-23 DIAGNOSIS — M79672 Pain in left foot: Secondary | ICD-10-CM | POA: Diagnosis not present

## 2023-07-24 DIAGNOSIS — I700 Atherosclerosis of aorta: Secondary | ICD-10-CM | POA: Diagnosis not present

## 2023-07-24 DIAGNOSIS — M4807 Spinal stenosis, lumbosacral region: Secondary | ICD-10-CM | POA: Diagnosis not present

## 2023-07-24 DIAGNOSIS — G4733 Obstructive sleep apnea (adult) (pediatric): Secondary | ICD-10-CM | POA: Diagnosis not present

## 2023-07-24 DIAGNOSIS — I4510 Unspecified right bundle-branch block: Secondary | ICD-10-CM | POA: Diagnosis not present

## 2023-07-24 DIAGNOSIS — D3502 Benign neoplasm of left adrenal gland: Secondary | ICD-10-CM | POA: Diagnosis not present

## 2023-07-24 DIAGNOSIS — I4819 Other persistent atrial fibrillation: Secondary | ICD-10-CM | POA: Diagnosis not present

## 2023-07-24 DIAGNOSIS — I130 Hypertensive heart and chronic kidney disease with heart failure and stage 1 through stage 4 chronic kidney disease, or unspecified chronic kidney disease: Secondary | ICD-10-CM | POA: Diagnosis not present

## 2023-07-24 DIAGNOSIS — E1122 Type 2 diabetes mellitus with diabetic chronic kidney disease: Secondary | ICD-10-CM | POA: Diagnosis not present

## 2023-07-24 DIAGNOSIS — M19031 Primary osteoarthritis, right wrist: Secondary | ICD-10-CM | POA: Diagnosis not present

## 2023-07-24 DIAGNOSIS — M19032 Primary osteoarthritis, left wrist: Secondary | ICD-10-CM | POA: Diagnosis not present

## 2023-07-24 DIAGNOSIS — M19041 Primary osteoarthritis, right hand: Secondary | ICD-10-CM | POA: Diagnosis not present

## 2023-07-24 DIAGNOSIS — M19042 Primary osteoarthritis, left hand: Secondary | ICD-10-CM | POA: Diagnosis not present

## 2023-07-24 DIAGNOSIS — K8020 Calculus of gallbladder without cholecystitis without obstruction: Secondary | ICD-10-CM | POA: Diagnosis not present

## 2023-07-24 DIAGNOSIS — K429 Umbilical hernia without obstruction or gangrene: Secondary | ICD-10-CM | POA: Diagnosis not present

## 2023-07-24 DIAGNOSIS — I2510 Atherosclerotic heart disease of native coronary artery without angina pectoris: Secondary | ICD-10-CM | POA: Diagnosis not present

## 2023-07-24 DIAGNOSIS — N1832 Chronic kidney disease, stage 3b: Secondary | ICD-10-CM | POA: Diagnosis not present

## 2023-07-24 DIAGNOSIS — J449 Chronic obstructive pulmonary disease, unspecified: Secondary | ICD-10-CM | POA: Diagnosis not present

## 2023-07-24 DIAGNOSIS — G8929 Other chronic pain: Secondary | ICD-10-CM | POA: Diagnosis not present

## 2023-07-24 DIAGNOSIS — E039 Hypothyroidism, unspecified: Secondary | ICD-10-CM | POA: Diagnosis not present

## 2023-07-24 DIAGNOSIS — M47816 Spondylosis without myelopathy or radiculopathy, lumbar region: Secondary | ICD-10-CM | POA: Diagnosis not present

## 2023-07-24 DIAGNOSIS — I5033 Acute on chronic diastolic (congestive) heart failure: Secondary | ICD-10-CM | POA: Diagnosis not present

## 2023-07-24 DIAGNOSIS — I4892 Unspecified atrial flutter: Secondary | ICD-10-CM | POA: Diagnosis not present

## 2023-07-24 DIAGNOSIS — I440 Atrioventricular block, first degree: Secondary | ICD-10-CM | POA: Diagnosis not present

## 2023-07-24 DIAGNOSIS — M48061 Spinal stenosis, lumbar region without neurogenic claudication: Secondary | ICD-10-CM | POA: Diagnosis not present

## 2023-07-24 DIAGNOSIS — I472 Ventricular tachycardia, unspecified: Secondary | ICD-10-CM | POA: Diagnosis not present

## 2023-07-25 ENCOUNTER — Ambulatory Visit: Payer: Medicare Other

## 2023-07-25 ENCOUNTER — Encounter (HOSPITAL_COMMUNITY): Payer: Medicare Other

## 2023-07-25 DIAGNOSIS — M19032 Primary osteoarthritis, left wrist: Secondary | ICD-10-CM | POA: Diagnosis not present

## 2023-07-25 DIAGNOSIS — M19041 Primary osteoarthritis, right hand: Secondary | ICD-10-CM | POA: Diagnosis not present

## 2023-07-25 DIAGNOSIS — I440 Atrioventricular block, first degree: Secondary | ICD-10-CM | POA: Diagnosis not present

## 2023-07-25 DIAGNOSIS — M19031 Primary osteoarthritis, right wrist: Secondary | ICD-10-CM | POA: Diagnosis not present

## 2023-07-25 DIAGNOSIS — I130 Hypertensive heart and chronic kidney disease with heart failure and stage 1 through stage 4 chronic kidney disease, or unspecified chronic kidney disease: Secondary | ICD-10-CM | POA: Diagnosis not present

## 2023-07-25 DIAGNOSIS — E039 Hypothyroidism, unspecified: Secondary | ICD-10-CM | POA: Diagnosis not present

## 2023-07-25 DIAGNOSIS — G4733 Obstructive sleep apnea (adult) (pediatric): Secondary | ICD-10-CM | POA: Diagnosis not present

## 2023-07-25 DIAGNOSIS — J449 Chronic obstructive pulmonary disease, unspecified: Secondary | ICD-10-CM | POA: Diagnosis not present

## 2023-07-25 DIAGNOSIS — G8929 Other chronic pain: Secondary | ICD-10-CM | POA: Diagnosis not present

## 2023-07-25 DIAGNOSIS — I4510 Unspecified right bundle-branch block: Secondary | ICD-10-CM | POA: Diagnosis not present

## 2023-07-25 DIAGNOSIS — E1122 Type 2 diabetes mellitus with diabetic chronic kidney disease: Secondary | ICD-10-CM | POA: Diagnosis not present

## 2023-07-25 DIAGNOSIS — I700 Atherosclerosis of aorta: Secondary | ICD-10-CM | POA: Diagnosis not present

## 2023-07-25 DIAGNOSIS — I2510 Atherosclerotic heart disease of native coronary artery without angina pectoris: Secondary | ICD-10-CM | POA: Diagnosis not present

## 2023-07-25 DIAGNOSIS — I4892 Unspecified atrial flutter: Secondary | ICD-10-CM | POA: Diagnosis not present

## 2023-07-25 DIAGNOSIS — M47816 Spondylosis without myelopathy or radiculopathy, lumbar region: Secondary | ICD-10-CM | POA: Diagnosis not present

## 2023-07-25 DIAGNOSIS — I4819 Other persistent atrial fibrillation: Secondary | ICD-10-CM | POA: Diagnosis not present

## 2023-07-25 DIAGNOSIS — I472 Ventricular tachycardia, unspecified: Secondary | ICD-10-CM | POA: Diagnosis not present

## 2023-07-25 DIAGNOSIS — M19042 Primary osteoarthritis, left hand: Secondary | ICD-10-CM | POA: Diagnosis not present

## 2023-07-25 DIAGNOSIS — M4807 Spinal stenosis, lumbosacral region: Secondary | ICD-10-CM | POA: Diagnosis not present

## 2023-07-25 DIAGNOSIS — K8020 Calculus of gallbladder without cholecystitis without obstruction: Secondary | ICD-10-CM | POA: Diagnosis not present

## 2023-07-25 DIAGNOSIS — I5033 Acute on chronic diastolic (congestive) heart failure: Secondary | ICD-10-CM | POA: Diagnosis not present

## 2023-07-25 DIAGNOSIS — D3502 Benign neoplasm of left adrenal gland: Secondary | ICD-10-CM | POA: Diagnosis not present

## 2023-07-25 DIAGNOSIS — K429 Umbilical hernia without obstruction or gangrene: Secondary | ICD-10-CM | POA: Diagnosis not present

## 2023-07-25 DIAGNOSIS — N1832 Chronic kidney disease, stage 3b: Secondary | ICD-10-CM | POA: Diagnosis not present

## 2023-07-25 DIAGNOSIS — M48061 Spinal stenosis, lumbar region without neurogenic claudication: Secondary | ICD-10-CM | POA: Diagnosis not present

## 2023-08-07 DIAGNOSIS — M79672 Pain in left foot: Secondary | ICD-10-CM | POA: Diagnosis not present

## 2023-08-08 ENCOUNTER — Other Ambulatory Visit: Payer: Self-pay | Admitting: Cardiology

## 2023-08-13 DIAGNOSIS — K8020 Calculus of gallbladder without cholecystitis without obstruction: Secondary | ICD-10-CM | POA: Diagnosis not present

## 2023-08-13 DIAGNOSIS — I472 Ventricular tachycardia, unspecified: Secondary | ICD-10-CM | POA: Diagnosis not present

## 2023-08-13 DIAGNOSIS — I4819 Other persistent atrial fibrillation: Secondary | ICD-10-CM | POA: Diagnosis not present

## 2023-08-13 DIAGNOSIS — I2510 Atherosclerotic heart disease of native coronary artery without angina pectoris: Secondary | ICD-10-CM | POA: Diagnosis not present

## 2023-08-13 DIAGNOSIS — I700 Atherosclerosis of aorta: Secondary | ICD-10-CM | POA: Diagnosis not present

## 2023-08-13 DIAGNOSIS — M19041 Primary osteoarthritis, right hand: Secondary | ICD-10-CM | POA: Diagnosis not present

## 2023-08-13 DIAGNOSIS — M47816 Spondylosis without myelopathy or radiculopathy, lumbar region: Secondary | ICD-10-CM | POA: Diagnosis not present

## 2023-08-13 DIAGNOSIS — K429 Umbilical hernia without obstruction or gangrene: Secondary | ICD-10-CM | POA: Diagnosis not present

## 2023-08-13 DIAGNOSIS — M19032 Primary osteoarthritis, left wrist: Secondary | ICD-10-CM | POA: Diagnosis not present

## 2023-08-13 DIAGNOSIS — N1832 Chronic kidney disease, stage 3b: Secondary | ICD-10-CM | POA: Diagnosis not present

## 2023-08-13 DIAGNOSIS — I130 Hypertensive heart and chronic kidney disease with heart failure and stage 1 through stage 4 chronic kidney disease, or unspecified chronic kidney disease: Secondary | ICD-10-CM | POA: Diagnosis not present

## 2023-08-13 DIAGNOSIS — G8929 Other chronic pain: Secondary | ICD-10-CM | POA: Diagnosis not present

## 2023-08-13 DIAGNOSIS — J449 Chronic obstructive pulmonary disease, unspecified: Secondary | ICD-10-CM | POA: Diagnosis not present

## 2023-08-13 DIAGNOSIS — I5033 Acute on chronic diastolic (congestive) heart failure: Secondary | ICD-10-CM | POA: Diagnosis not present

## 2023-08-13 DIAGNOSIS — I4510 Unspecified right bundle-branch block: Secondary | ICD-10-CM | POA: Diagnosis not present

## 2023-08-13 DIAGNOSIS — I440 Atrioventricular block, first degree: Secondary | ICD-10-CM | POA: Diagnosis not present

## 2023-08-13 DIAGNOSIS — E039 Hypothyroidism, unspecified: Secondary | ICD-10-CM | POA: Diagnosis not present

## 2023-08-13 DIAGNOSIS — M4807 Spinal stenosis, lumbosacral region: Secondary | ICD-10-CM | POA: Diagnosis not present

## 2023-08-13 DIAGNOSIS — I4892 Unspecified atrial flutter: Secondary | ICD-10-CM | POA: Diagnosis not present

## 2023-08-13 DIAGNOSIS — E1122 Type 2 diabetes mellitus with diabetic chronic kidney disease: Secondary | ICD-10-CM | POA: Diagnosis not present

## 2023-08-13 DIAGNOSIS — G4733 Obstructive sleep apnea (adult) (pediatric): Secondary | ICD-10-CM | POA: Diagnosis not present

## 2023-08-13 DIAGNOSIS — M19042 Primary osteoarthritis, left hand: Secondary | ICD-10-CM | POA: Diagnosis not present

## 2023-08-13 DIAGNOSIS — D3502 Benign neoplasm of left adrenal gland: Secondary | ICD-10-CM | POA: Diagnosis not present

## 2023-08-13 DIAGNOSIS — M48061 Spinal stenosis, lumbar region without neurogenic claudication: Secondary | ICD-10-CM | POA: Diagnosis not present

## 2023-08-13 DIAGNOSIS — M19031 Primary osteoarthritis, right wrist: Secondary | ICD-10-CM | POA: Diagnosis not present

## 2023-08-17 DIAGNOSIS — I5033 Acute on chronic diastolic (congestive) heart failure: Secondary | ICD-10-CM | POA: Diagnosis not present

## 2023-08-17 DIAGNOSIS — G4733 Obstructive sleep apnea (adult) (pediatric): Secondary | ICD-10-CM | POA: Diagnosis not present

## 2023-08-17 DIAGNOSIS — K429 Umbilical hernia without obstruction or gangrene: Secondary | ICD-10-CM | POA: Diagnosis not present

## 2023-08-17 DIAGNOSIS — K8020 Calculus of gallbladder without cholecystitis without obstruction: Secondary | ICD-10-CM | POA: Diagnosis not present

## 2023-08-17 DIAGNOSIS — M48061 Spinal stenosis, lumbar region without neurogenic claudication: Secondary | ICD-10-CM | POA: Diagnosis not present

## 2023-08-17 DIAGNOSIS — E039 Hypothyroidism, unspecified: Secondary | ICD-10-CM | POA: Diagnosis not present

## 2023-08-17 DIAGNOSIS — M47816 Spondylosis without myelopathy or radiculopathy, lumbar region: Secondary | ICD-10-CM | POA: Diagnosis not present

## 2023-08-17 DIAGNOSIS — N1832 Chronic kidney disease, stage 3b: Secondary | ICD-10-CM | POA: Diagnosis not present

## 2023-08-17 DIAGNOSIS — I130 Hypertensive heart and chronic kidney disease with heart failure and stage 1 through stage 4 chronic kidney disease, or unspecified chronic kidney disease: Secondary | ICD-10-CM | POA: Diagnosis not present

## 2023-08-17 DIAGNOSIS — M19032 Primary osteoarthritis, left wrist: Secondary | ICD-10-CM | POA: Diagnosis not present

## 2023-08-17 DIAGNOSIS — M19041 Primary osteoarthritis, right hand: Secondary | ICD-10-CM | POA: Diagnosis not present

## 2023-08-17 DIAGNOSIS — E1122 Type 2 diabetes mellitus with diabetic chronic kidney disease: Secondary | ICD-10-CM | POA: Diagnosis not present

## 2023-08-17 DIAGNOSIS — M19031 Primary osteoarthritis, right wrist: Secondary | ICD-10-CM | POA: Diagnosis not present

## 2023-08-17 DIAGNOSIS — I700 Atherosclerosis of aorta: Secondary | ICD-10-CM | POA: Diagnosis not present

## 2023-08-17 DIAGNOSIS — D3502 Benign neoplasm of left adrenal gland: Secondary | ICD-10-CM | POA: Diagnosis not present

## 2023-08-17 DIAGNOSIS — I4892 Unspecified atrial flutter: Secondary | ICD-10-CM | POA: Diagnosis not present

## 2023-08-17 DIAGNOSIS — J449 Chronic obstructive pulmonary disease, unspecified: Secondary | ICD-10-CM | POA: Diagnosis not present

## 2023-08-17 DIAGNOSIS — M4807 Spinal stenosis, lumbosacral region: Secondary | ICD-10-CM | POA: Diagnosis not present

## 2023-08-17 DIAGNOSIS — I4510 Unspecified right bundle-branch block: Secondary | ICD-10-CM | POA: Diagnosis not present

## 2023-08-17 DIAGNOSIS — M19042 Primary osteoarthritis, left hand: Secondary | ICD-10-CM | POA: Diagnosis not present

## 2023-08-17 DIAGNOSIS — I4819 Other persistent atrial fibrillation: Secondary | ICD-10-CM | POA: Diagnosis not present

## 2023-08-17 DIAGNOSIS — I472 Ventricular tachycardia, unspecified: Secondary | ICD-10-CM | POA: Diagnosis not present

## 2023-08-17 DIAGNOSIS — I440 Atrioventricular block, first degree: Secondary | ICD-10-CM | POA: Diagnosis not present

## 2023-08-17 DIAGNOSIS — G8929 Other chronic pain: Secondary | ICD-10-CM | POA: Diagnosis not present

## 2023-08-17 DIAGNOSIS — I2510 Atherosclerotic heart disease of native coronary artery without angina pectoris: Secondary | ICD-10-CM | POA: Diagnosis not present

## 2023-08-21 DIAGNOSIS — G4733 Obstructive sleep apnea (adult) (pediatric): Secondary | ICD-10-CM | POA: Diagnosis not present

## 2023-08-21 DIAGNOSIS — I5033 Acute on chronic diastolic (congestive) heart failure: Secondary | ICD-10-CM | POA: Diagnosis not present

## 2023-08-21 DIAGNOSIS — M19041 Primary osteoarthritis, right hand: Secondary | ICD-10-CM | POA: Diagnosis not present

## 2023-08-21 DIAGNOSIS — I4819 Other persistent atrial fibrillation: Secondary | ICD-10-CM | POA: Diagnosis not present

## 2023-08-21 DIAGNOSIS — K8020 Calculus of gallbladder without cholecystitis without obstruction: Secondary | ICD-10-CM | POA: Diagnosis not present

## 2023-08-21 DIAGNOSIS — K429 Umbilical hernia without obstruction or gangrene: Secondary | ICD-10-CM | POA: Diagnosis not present

## 2023-08-21 DIAGNOSIS — I700 Atherosclerosis of aorta: Secondary | ICD-10-CM | POA: Diagnosis not present

## 2023-08-21 DIAGNOSIS — G8929 Other chronic pain: Secondary | ICD-10-CM | POA: Diagnosis not present

## 2023-08-21 DIAGNOSIS — I472 Ventricular tachycardia, unspecified: Secondary | ICD-10-CM | POA: Diagnosis not present

## 2023-08-21 DIAGNOSIS — E1122 Type 2 diabetes mellitus with diabetic chronic kidney disease: Secondary | ICD-10-CM | POA: Diagnosis not present

## 2023-08-21 DIAGNOSIS — M4807 Spinal stenosis, lumbosacral region: Secondary | ICD-10-CM | POA: Diagnosis not present

## 2023-08-21 DIAGNOSIS — I440 Atrioventricular block, first degree: Secondary | ICD-10-CM | POA: Diagnosis not present

## 2023-08-21 DIAGNOSIS — E039 Hypothyroidism, unspecified: Secondary | ICD-10-CM | POA: Diagnosis not present

## 2023-08-21 DIAGNOSIS — N1832 Chronic kidney disease, stage 3b: Secondary | ICD-10-CM | POA: Diagnosis not present

## 2023-08-21 DIAGNOSIS — M48061 Spinal stenosis, lumbar region without neurogenic claudication: Secondary | ICD-10-CM | POA: Diagnosis not present

## 2023-08-21 DIAGNOSIS — M19031 Primary osteoarthritis, right wrist: Secondary | ICD-10-CM | POA: Diagnosis not present

## 2023-08-21 DIAGNOSIS — I2510 Atherosclerotic heart disease of native coronary artery without angina pectoris: Secondary | ICD-10-CM | POA: Diagnosis not present

## 2023-08-21 DIAGNOSIS — M47816 Spondylosis without myelopathy or radiculopathy, lumbar region: Secondary | ICD-10-CM | POA: Diagnosis not present

## 2023-08-21 DIAGNOSIS — M19042 Primary osteoarthritis, left hand: Secondary | ICD-10-CM | POA: Diagnosis not present

## 2023-08-21 DIAGNOSIS — D3502 Benign neoplasm of left adrenal gland: Secondary | ICD-10-CM | POA: Diagnosis not present

## 2023-08-21 DIAGNOSIS — I4892 Unspecified atrial flutter: Secondary | ICD-10-CM | POA: Diagnosis not present

## 2023-08-21 DIAGNOSIS — I130 Hypertensive heart and chronic kidney disease with heart failure and stage 1 through stage 4 chronic kidney disease, or unspecified chronic kidney disease: Secondary | ICD-10-CM | POA: Diagnosis not present

## 2023-08-21 DIAGNOSIS — M19032 Primary osteoarthritis, left wrist: Secondary | ICD-10-CM | POA: Diagnosis not present

## 2023-08-21 DIAGNOSIS — J449 Chronic obstructive pulmonary disease, unspecified: Secondary | ICD-10-CM | POA: Diagnosis not present

## 2023-08-21 DIAGNOSIS — I4510 Unspecified right bundle-branch block: Secondary | ICD-10-CM | POA: Diagnosis not present

## 2023-08-23 ENCOUNTER — Other Ambulatory Visit: Payer: Self-pay | Admitting: Cardiology

## 2023-08-23 DIAGNOSIS — I472 Ventricular tachycardia, unspecified: Secondary | ICD-10-CM

## 2023-08-27 ENCOUNTER — Telehealth (HOSPITAL_COMMUNITY): Payer: Self-pay

## 2023-08-27 ENCOUNTER — Other Ambulatory Visit (HOSPITAL_COMMUNITY): Payer: Self-pay

## 2023-08-27 NOTE — Telephone Encounter (Signed)
 Advanced Heart Failure Patient Advocate Encounter  Received notification from [employer] that prior authorization is required for Pitavastatin . Review of this plan shows that brand Livalo  is preferred.   Test claim for Livalo  (DAW 9) returns $47 copay for 30 days, $141 for 90 days.   No prior authorization submitted at this time.  Kennis Peacock, CPhT Rx Patient Advocate Phone: [phone]

## 2023-08-28 ENCOUNTER — Ambulatory Visit: Payer: Self-pay | Admitting: Family Medicine

## 2023-08-28 ENCOUNTER — Telehealth: Payer: Self-pay | Admitting: Family Medicine

## 2023-08-28 NOTE — Telephone Encounter (Signed)
Copied from CRM [id number]. Topic: Clinical - Home Health Verbal Orders >> Aug 28, 2023 10:20 AM Eunice Blase wrote: Caller/Agency: [facility] per Lucilla Lame Number: [phone] Service Requested: Physical Therapy Frequency: Pt has decrease appetite, increase weakness and cough Any new concerns about the patient? Yes

## 2023-08-28 NOTE — Telephone Encounter (Addendum)
Copied from CRM [id number]. Topic: Clinical - Red Word Triage >> Aug 28, 2023 10:25 AM Eunice Blase wrote: Red Word that prompted transfer to Nurse Triage: Heidi Stark need verbal order to see pt at 12 noon pt decrease appetite, increase weakness, coughing per [facility] Access line and the representative stated that the nurse is unavailable for call transfer and a message would need to be sent.  Marylene Land is the Clinical Facilities manager at [facility]. Please call Marylene Land back at [phone]

## 2023-08-29 ENCOUNTER — Other Ambulatory Visit: Payer: Self-pay | Admitting: Cardiology

## 2023-08-29 DIAGNOSIS — E876 Hypokalemia: Secondary | ICD-10-CM

## 2023-08-29 NOTE — Telephone Encounter (Signed)
Spoke with Marylene Land. Confirmed order for nursing evaluation. Marylene Land states she will encourage a visit to the office since pt has not been seen by PCP in a while

## 2023-08-29 NOTE — Telephone Encounter (Signed)
See other telephone note.

## 2023-08-30 DIAGNOSIS — D3502 Benign neoplasm of left adrenal gland: Secondary | ICD-10-CM | POA: Diagnosis not present

## 2023-08-30 DIAGNOSIS — M19042 Primary osteoarthritis, left hand: Secondary | ICD-10-CM | POA: Diagnosis not present

## 2023-08-30 DIAGNOSIS — E039 Hypothyroidism, unspecified: Secondary | ICD-10-CM | POA: Diagnosis not present

## 2023-08-30 DIAGNOSIS — I472 Ventricular tachycardia, unspecified: Secondary | ICD-10-CM | POA: Diagnosis not present

## 2023-08-30 DIAGNOSIS — M19032 Primary osteoarthritis, left wrist: Secondary | ICD-10-CM | POA: Diagnosis not present

## 2023-08-30 DIAGNOSIS — M48061 Spinal stenosis, lumbar region without neurogenic claudication: Secondary | ICD-10-CM | POA: Diagnosis not present

## 2023-08-30 DIAGNOSIS — G8929 Other chronic pain: Secondary | ICD-10-CM | POA: Diagnosis not present

## 2023-08-30 DIAGNOSIS — K8020 Calculus of gallbladder without cholecystitis without obstruction: Secondary | ICD-10-CM | POA: Diagnosis not present

## 2023-08-30 DIAGNOSIS — E1122 Type 2 diabetes mellitus with diabetic chronic kidney disease: Secondary | ICD-10-CM | POA: Diagnosis not present

## 2023-08-30 DIAGNOSIS — I440 Atrioventricular block, first degree: Secondary | ICD-10-CM | POA: Diagnosis not present

## 2023-08-30 DIAGNOSIS — N1832 Chronic kidney disease, stage 3b: Secondary | ICD-10-CM | POA: Diagnosis not present

## 2023-08-30 DIAGNOSIS — J449 Chronic obstructive pulmonary disease, unspecified: Secondary | ICD-10-CM | POA: Diagnosis not present

## 2023-08-30 DIAGNOSIS — G4733 Obstructive sleep apnea (adult) (pediatric): Secondary | ICD-10-CM | POA: Diagnosis not present

## 2023-08-30 DIAGNOSIS — I4892 Unspecified atrial flutter: Secondary | ICD-10-CM | POA: Diagnosis not present

## 2023-08-30 DIAGNOSIS — M19031 Primary osteoarthritis, right wrist: Secondary | ICD-10-CM | POA: Diagnosis not present

## 2023-08-30 DIAGNOSIS — I4819 Other persistent atrial fibrillation: Secondary | ICD-10-CM | POA: Diagnosis not present

## 2023-08-30 DIAGNOSIS — I130 Hypertensive heart and chronic kidney disease with heart failure and stage 1 through stage 4 chronic kidney disease, or unspecified chronic kidney disease: Secondary | ICD-10-CM | POA: Diagnosis not present

## 2023-08-30 DIAGNOSIS — M19041 Primary osteoarthritis, right hand: Secondary | ICD-10-CM | POA: Diagnosis not present

## 2023-08-30 DIAGNOSIS — M4807 Spinal stenosis, lumbosacral region: Secondary | ICD-10-CM | POA: Diagnosis not present

## 2023-08-30 DIAGNOSIS — I2510 Atherosclerotic heart disease of native coronary artery without angina pectoris: Secondary | ICD-10-CM | POA: Diagnosis not present

## 2023-08-30 DIAGNOSIS — I700 Atherosclerosis of aorta: Secondary | ICD-10-CM | POA: Diagnosis not present

## 2023-08-30 DIAGNOSIS — I5033 Acute on chronic diastolic (congestive) heart failure: Secondary | ICD-10-CM | POA: Diagnosis not present

## 2023-08-30 DIAGNOSIS — M47816 Spondylosis without myelopathy or radiculopathy, lumbar region: Secondary | ICD-10-CM | POA: Diagnosis not present

## 2023-08-30 DIAGNOSIS — I4510 Unspecified right bundle-branch block: Secondary | ICD-10-CM | POA: Diagnosis not present

## 2023-08-30 DIAGNOSIS — K429 Umbilical hernia without obstruction or gangrene: Secondary | ICD-10-CM | POA: Diagnosis not present

## 2023-08-31 DIAGNOSIS — M47816 Spondylosis without myelopathy or radiculopathy, lumbar region: Secondary | ICD-10-CM | POA: Diagnosis not present

## 2023-08-31 DIAGNOSIS — I4892 Unspecified atrial flutter: Secondary | ICD-10-CM | POA: Diagnosis not present

## 2023-08-31 DIAGNOSIS — M19042 Primary osteoarthritis, left hand: Secondary | ICD-10-CM | POA: Diagnosis not present

## 2023-08-31 DIAGNOSIS — M4807 Spinal stenosis, lumbosacral region: Secondary | ICD-10-CM | POA: Diagnosis not present

## 2023-08-31 DIAGNOSIS — I472 Ventricular tachycardia, unspecified: Secondary | ICD-10-CM | POA: Diagnosis not present

## 2023-08-31 DIAGNOSIS — M19041 Primary osteoarthritis, right hand: Secondary | ICD-10-CM | POA: Diagnosis not present

## 2023-08-31 DIAGNOSIS — E039 Hypothyroidism, unspecified: Secondary | ICD-10-CM | POA: Diagnosis not present

## 2023-08-31 DIAGNOSIS — I5033 Acute on chronic diastolic (congestive) heart failure: Secondary | ICD-10-CM | POA: Diagnosis not present

## 2023-08-31 DIAGNOSIS — E1122 Type 2 diabetes mellitus with diabetic chronic kidney disease: Secondary | ICD-10-CM | POA: Diagnosis not present

## 2023-08-31 DIAGNOSIS — J449 Chronic obstructive pulmonary disease, unspecified: Secondary | ICD-10-CM | POA: Diagnosis not present

## 2023-08-31 DIAGNOSIS — G8929 Other chronic pain: Secondary | ICD-10-CM | POA: Diagnosis not present

## 2023-08-31 DIAGNOSIS — I700 Atherosclerosis of aorta: Secondary | ICD-10-CM | POA: Diagnosis not present

## 2023-08-31 DIAGNOSIS — I2510 Atherosclerotic heart disease of native coronary artery without angina pectoris: Secondary | ICD-10-CM | POA: Diagnosis not present

## 2023-08-31 DIAGNOSIS — M19031 Primary osteoarthritis, right wrist: Secondary | ICD-10-CM | POA: Diagnosis not present

## 2023-08-31 DIAGNOSIS — D3502 Benign neoplasm of left adrenal gland: Secondary | ICD-10-CM | POA: Diagnosis not present

## 2023-08-31 DIAGNOSIS — I130 Hypertensive heart and chronic kidney disease with heart failure and stage 1 through stage 4 chronic kidney disease, or unspecified chronic kidney disease: Secondary | ICD-10-CM | POA: Diagnosis not present

## 2023-08-31 DIAGNOSIS — M19032 Primary osteoarthritis, left wrist: Secondary | ICD-10-CM | POA: Diagnosis not present

## 2023-08-31 DIAGNOSIS — G4733 Obstructive sleep apnea (adult) (pediatric): Secondary | ICD-10-CM | POA: Diagnosis not present

## 2023-08-31 DIAGNOSIS — K429 Umbilical hernia without obstruction or gangrene: Secondary | ICD-10-CM | POA: Diagnosis not present

## 2023-08-31 DIAGNOSIS — I440 Atrioventricular block, first degree: Secondary | ICD-10-CM | POA: Diagnosis not present

## 2023-08-31 DIAGNOSIS — K8020 Calculus of gallbladder without cholecystitis without obstruction: Secondary | ICD-10-CM | POA: Diagnosis not present

## 2023-08-31 DIAGNOSIS — M48061 Spinal stenosis, lumbar region without neurogenic claudication: Secondary | ICD-10-CM | POA: Diagnosis not present

## 2023-08-31 DIAGNOSIS — I4819 Other persistent atrial fibrillation: Secondary | ICD-10-CM | POA: Diagnosis not present

## 2023-08-31 DIAGNOSIS — N1832 Chronic kidney disease, stage 3b: Secondary | ICD-10-CM | POA: Diagnosis not present

## 2023-08-31 DIAGNOSIS — I4510 Unspecified right bundle-branch block: Secondary | ICD-10-CM | POA: Diagnosis not present

## 2023-09-04 DIAGNOSIS — M19031 Primary osteoarthritis, right wrist: Secondary | ICD-10-CM | POA: Diagnosis not present

## 2023-09-04 DIAGNOSIS — K8020 Calculus of gallbladder without cholecystitis without obstruction: Secondary | ICD-10-CM | POA: Diagnosis not present

## 2023-09-04 DIAGNOSIS — I440 Atrioventricular block, first degree: Secondary | ICD-10-CM | POA: Diagnosis not present

## 2023-09-04 DIAGNOSIS — I4819 Other persistent atrial fibrillation: Secondary | ICD-10-CM | POA: Diagnosis not present

## 2023-09-04 DIAGNOSIS — I700 Atherosclerosis of aorta: Secondary | ICD-10-CM | POA: Diagnosis not present

## 2023-09-04 DIAGNOSIS — I472 Ventricular tachycardia, unspecified: Secondary | ICD-10-CM | POA: Diagnosis not present

## 2023-09-04 DIAGNOSIS — K429 Umbilical hernia without obstruction or gangrene: Secondary | ICD-10-CM | POA: Diagnosis not present

## 2023-09-04 DIAGNOSIS — G4733 Obstructive sleep apnea (adult) (pediatric): Secondary | ICD-10-CM | POA: Diagnosis not present

## 2023-09-04 DIAGNOSIS — G8929 Other chronic pain: Secondary | ICD-10-CM | POA: Diagnosis not present

## 2023-09-04 DIAGNOSIS — J449 Chronic obstructive pulmonary disease, unspecified: Secondary | ICD-10-CM | POA: Diagnosis not present

## 2023-09-04 DIAGNOSIS — N1832 Chronic kidney disease, stage 3b: Secondary | ICD-10-CM | POA: Diagnosis not present

## 2023-09-04 DIAGNOSIS — I130 Hypertensive heart and chronic kidney disease with heart failure and stage 1 through stage 4 chronic kidney disease, or unspecified chronic kidney disease: Secondary | ICD-10-CM | POA: Diagnosis not present

## 2023-09-04 DIAGNOSIS — I4510 Unspecified right bundle-branch block: Secondary | ICD-10-CM | POA: Diagnosis not present

## 2023-09-04 DIAGNOSIS — E1122 Type 2 diabetes mellitus with diabetic chronic kidney disease: Secondary | ICD-10-CM | POA: Diagnosis not present

## 2023-09-04 DIAGNOSIS — M19041 Primary osteoarthritis, right hand: Secondary | ICD-10-CM | POA: Diagnosis not present

## 2023-09-04 DIAGNOSIS — M19032 Primary osteoarthritis, left wrist: Secondary | ICD-10-CM | POA: Diagnosis not present

## 2023-09-04 DIAGNOSIS — M19042 Primary osteoarthritis, left hand: Secondary | ICD-10-CM | POA: Diagnosis not present

## 2023-09-04 DIAGNOSIS — M4807 Spinal stenosis, lumbosacral region: Secondary | ICD-10-CM | POA: Diagnosis not present

## 2023-09-04 DIAGNOSIS — D3502 Benign neoplasm of left adrenal gland: Secondary | ICD-10-CM | POA: Diagnosis not present

## 2023-09-04 DIAGNOSIS — I5033 Acute on chronic diastolic (congestive) heart failure: Secondary | ICD-10-CM | POA: Diagnosis not present

## 2023-09-04 DIAGNOSIS — E039 Hypothyroidism, unspecified: Secondary | ICD-10-CM | POA: Diagnosis not present

## 2023-09-04 DIAGNOSIS — M47816 Spondylosis without myelopathy or radiculopathy, lumbar region: Secondary | ICD-10-CM | POA: Diagnosis not present

## 2023-09-04 DIAGNOSIS — I2510 Atherosclerotic heart disease of native coronary artery without angina pectoris: Secondary | ICD-10-CM | POA: Diagnosis not present

## 2023-09-04 DIAGNOSIS — I4892 Unspecified atrial flutter: Secondary | ICD-10-CM | POA: Diagnosis not present

## 2023-09-04 DIAGNOSIS — M48061 Spinal stenosis, lumbar region without neurogenic claudication: Secondary | ICD-10-CM | POA: Diagnosis not present

## 2023-09-11 ENCOUNTER — Inpatient Hospital Stay: Payer: Medicare Other | Admitting: Family Medicine

## 2023-09-12 ENCOUNTER — Ambulatory Visit (HOSPITAL_COMMUNITY): Payer: Medicare Other

## 2023-09-12 ENCOUNTER — Ambulatory Visit: Payer: Medicare Other

## 2023-09-13 ENCOUNTER — Ambulatory Visit: Payer: Medicare Other

## 2023-09-13 DIAGNOSIS — E1122 Type 2 diabetes mellitus with diabetic chronic kidney disease: Secondary | ICD-10-CM | POA: Diagnosis not present

## 2023-09-13 DIAGNOSIS — I700 Atherosclerosis of aorta: Secondary | ICD-10-CM | POA: Diagnosis not present

## 2023-09-13 DIAGNOSIS — G8929 Other chronic pain: Secondary | ICD-10-CM | POA: Diagnosis not present

## 2023-09-13 DIAGNOSIS — M4807 Spinal stenosis, lumbosacral region: Secondary | ICD-10-CM | POA: Diagnosis not present

## 2023-09-13 DIAGNOSIS — M19042 Primary osteoarthritis, left hand: Secondary | ICD-10-CM | POA: Diagnosis not present

## 2023-09-13 DIAGNOSIS — M19041 Primary osteoarthritis, right hand: Secondary | ICD-10-CM | POA: Diagnosis not present

## 2023-09-13 DIAGNOSIS — I5033 Acute on chronic diastolic (congestive) heart failure: Secondary | ICD-10-CM | POA: Diagnosis not present

## 2023-09-13 DIAGNOSIS — I130 Hypertensive heart and chronic kidney disease with heart failure and stage 1 through stage 4 chronic kidney disease, or unspecified chronic kidney disease: Secondary | ICD-10-CM | POA: Diagnosis not present

## 2023-09-13 DIAGNOSIS — I472 Ventricular tachycardia, unspecified: Secondary | ICD-10-CM | POA: Diagnosis not present

## 2023-09-13 DIAGNOSIS — J449 Chronic obstructive pulmonary disease, unspecified: Secondary | ICD-10-CM | POA: Diagnosis not present

## 2023-09-13 DIAGNOSIS — G4733 Obstructive sleep apnea (adult) (pediatric): Secondary | ICD-10-CM | POA: Diagnosis not present

## 2023-09-13 DIAGNOSIS — K429 Umbilical hernia without obstruction or gangrene: Secondary | ICD-10-CM | POA: Diagnosis not present

## 2023-09-13 DIAGNOSIS — I2510 Atherosclerotic heart disease of native coronary artery without angina pectoris: Secondary | ICD-10-CM | POA: Diagnosis not present

## 2023-09-13 DIAGNOSIS — I4892 Unspecified atrial flutter: Secondary | ICD-10-CM | POA: Diagnosis not present

## 2023-09-13 DIAGNOSIS — M19031 Primary osteoarthritis, right wrist: Secondary | ICD-10-CM | POA: Diagnosis not present

## 2023-09-13 DIAGNOSIS — M19032 Primary osteoarthritis, left wrist: Secondary | ICD-10-CM | POA: Diagnosis not present

## 2023-09-13 DIAGNOSIS — M47816 Spondylosis without myelopathy or radiculopathy, lumbar region: Secondary | ICD-10-CM | POA: Diagnosis not present

## 2023-09-13 DIAGNOSIS — I4510 Unspecified right bundle-branch block: Secondary | ICD-10-CM | POA: Diagnosis not present

## 2023-09-13 DIAGNOSIS — I440 Atrioventricular block, first degree: Secondary | ICD-10-CM | POA: Diagnosis not present

## 2023-09-13 DIAGNOSIS — N1832 Chronic kidney disease, stage 3b: Secondary | ICD-10-CM | POA: Diagnosis not present

## 2023-09-13 DIAGNOSIS — I4819 Other persistent atrial fibrillation: Secondary | ICD-10-CM | POA: Diagnosis not present

## 2023-09-13 DIAGNOSIS — D3502 Benign neoplasm of left adrenal gland: Secondary | ICD-10-CM | POA: Diagnosis not present

## 2023-09-13 DIAGNOSIS — K8020 Calculus of gallbladder without cholecystitis without obstruction: Secondary | ICD-10-CM | POA: Diagnosis not present

## 2023-09-13 DIAGNOSIS — E039 Hypothyroidism, unspecified: Secondary | ICD-10-CM | POA: Diagnosis not present

## 2023-09-13 DIAGNOSIS — M48061 Spinal stenosis, lumbar region without neurogenic claudication: Secondary | ICD-10-CM | POA: Diagnosis not present

## 2023-09-14 ENCOUNTER — Encounter: Payer: Self-pay | Admitting: Family Medicine

## 2023-09-14 ENCOUNTER — Ambulatory Visit: Payer: Medicare Other | Admitting: Family Medicine

## 2023-09-14 VITALS — BP 100/70 | HR 78 | Temp 97.8°F | Resp 18 | Ht 66.0 in

## 2023-09-14 DIAGNOSIS — Z794 Long term (current) use of insulin: Secondary | ICD-10-CM

## 2023-09-14 DIAGNOSIS — E785 Hyperlipidemia, unspecified: Secondary | ICD-10-CM | POA: Diagnosis not present

## 2023-09-14 DIAGNOSIS — E039 Hypothyroidism, unspecified: Secondary | ICD-10-CM | POA: Diagnosis not present

## 2023-09-14 DIAGNOSIS — E1165 Type 2 diabetes mellitus with hyperglycemia: Secondary | ICD-10-CM | POA: Diagnosis not present

## 2023-09-14 DIAGNOSIS — I5020 Unspecified systolic (congestive) heart failure: Secondary | ICD-10-CM

## 2023-09-14 DIAGNOSIS — I480 Paroxysmal atrial fibrillation: Secondary | ICD-10-CM

## 2023-09-14 DIAGNOSIS — Z7689 Persons encountering health services in other specified circumstances: Secondary | ICD-10-CM

## 2023-09-14 DIAGNOSIS — I10 Essential (primary) hypertension: Secondary | ICD-10-CM

## 2023-09-14 DIAGNOSIS — F418 Other specified anxiety disorders: Secondary | ICD-10-CM

## 2023-09-14 MED ORDER — SERTRALINE HCL 25 MG PO TABS: 25.0000 mg | ORAL_TABLET | Freq: Every day | ORAL | 3 refills | Status: DC

## 2023-09-14 NOTE — Assessment & Plan Note (Signed)
 Well controlled, no changes to meds. Encouraged heart healthy diet such as the DASH diet and exercise as tolerated.

## 2023-09-14 NOTE — Assessment & Plan Note (Signed)
 Encourage heart healthy diet such as MIND or DASH diet, increase exercise, avoid trans fats, simple carbohydrates and processed foods, consider a krill or fish or flaxseed oil cap daily.

## 2023-09-14 NOTE — Assessment & Plan Note (Signed)
 Check labs

## 2023-09-14 NOTE — Patient Instructions (Signed)

## 2023-09-14 NOTE — Progress Notes (Signed)
 Established Patient Office Visit  Subjective   Patient ID: Heidi Stark, female    DOB: 1941-02-09  Age: 83 y.o. MRN: [id number]  Chief Complaint  Patient presents with   Hospitalization Follow-up    HPI Discussed the use of AI scribe software for clinical note transcription with the patient, who gave verbal consent to proceed.  History of Present Illness   Heidi Stark is an 83 year old female with diabetes who presents for a hospital follow-up after a recent hospitalization for cellulitis and sepsis. She is accompanied by her daughter.  She was hospitalized in October due to severe back pain and subsequent illness after taking pain medication. During her hospital stay, she was found to have a white blood cell count of 22, indicating sepsis, which was attributed to cellulitis on her leg. She gets sores easily, likely due to her diabetes, but currently has no new sores. She uses a honey-based topical treatment from [facility], which has been effective for her skin issues.  Her blood sugar levels have been stable, and she has not experienced any new sores recently. She is not currently seeing an endocrinologist but has been under the care of Dr. Talmage Nap in the past. She feels weak at times, with some days being better than others. She has a home health nurse who visits regularly.  She has experienced emotional distress since her husband's passing in September, which has been compounded by the stress of moving from her long-time home. She has had good and bad days emotionally and was previously prescribed medication for depression after her husband's death, which she did not find helpful. She has not been on any depression medication since then.  She has a history of anxiety and was previously prescribed Ativan on an as-needed basis. She is concerned about becoming addicted to medication and prefers not to take daily medication for anxiety.  No new sores or issues with blood sugar levels. She had  a recent spell of coughing but no current respiratory symptoms. She has not received a flu shot or COVID vaccine and does not wish to receive them. She does not undergo mammograms or bone density tests anymore.      Patient Active Problem List   Diagnosis Date Noted   Type 2 diabetes mellitus with hyperlipidemia (HCC) 05/16/2023   Hypothyroidism 05/16/2023   Cellulitis of left lower extremity 05/13/2023   Compression fracture of lumbar spine, non-traumatic, sequela 05/13/2023   Acute on chronic diastolic CHF (congestive heart failure) (HCC) 05/13/2023   Diabetic nephropathy (HCC) 10/13/2021   Hyperglycemia due to type 2 diabetes mellitus (HCC) 10/13/2021   Proteinuria 10/13/2021   Pure hypercholesterolemia 10/13/2021   COVID-19 04/15/2021   Respiratory failure with hypoxia (HCC) 04/15/2021   Systolic CHF (HCC) 04/15/2021   CKD (chronic kidney disease) stage 3, GFR 30-59 ml/min (HCC) 04/15/2021   Elevated troponin 04/15/2021   Acute on chronic systolic (congestive) heart failure (HCC) 09/14/2020   Wide-complex tachycardia 09/07/2020   Ventricular tachyarrhythmia (HCC) 09/07/2020   VT (ventricular tachycardia) (HCC) 09/07/2020   Persistent atrial fibrillation (HCC) 04/30/2018   Wound of left leg 10/01/2017   Cellulitis of right lower extremity 11/27/2016   Encounter for assessment for deep vein thrombosis (DVT) 11/14/2016   Hypokalemia 08/13/2016   Type 2 diabetes mellitus with hyperglycemia, with long-term current use of insulin (HCC)    Atherosclerosis of aorta (HCC) 06/26/2016   Colitis 11/24/2014   Severe obesity (BMI >= 40) (HCC) 11/12/2013   Paroxysmal atrial  fibrillation (HCC) 11/03/2013   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 10/28/2013   COLONIC POLYPS 08/16/2010   PAD (peripheral artery disease) (HCC) 08/15/2010   HLD (hyperlipidemia) 11/18/2009   MYALGIA 11/18/2009   OBESITY 05/14/2007   PULMONARY NODULE, RIGHT MIDDLE LOBE 05/14/2007    Obstructive sleep apnea 12/06/2006   Essential hypertension 12/06/2006   COPD (chronic obstructive pulmonary disease) (HCC) 12/06/2006   Past Medical History:  Diagnosis Date   Arthritis    "hands, wrists" (11/07/2016)   CHF (congestive heart failure) (HCC)    Chronic lower back pain    Constipation    DVT (deep venous thrombosis) (HCC)    History of blood transfusion 1990   "related to OR"   History of kidney stones    Hyperlipidemia    Hypertension    Joint pain    Lower extremity edema    OSA on CPAP    Persistent atrial fibrillation (HCC)    Pneumonia    "several times" (11/07/2016)   Type II diabetes mellitus (HCC)    Past Surgical History:  Procedure Laterality Date   ATRIAL FIBRILLATION ABLATION N/A 11/07/2016   Procedure: Atrial Fibrillation Ablation;  Surgeon: Will Jorja Loa, MD;  Location: [facility] IN[facility]IVE [facility];  Service: Cardiovascular;  Laterality: N/A;   BACK SURGERY     CARDIAC CATHETERIZATION     CARDIOVERSION N/A 08/11/2016   Procedure: CARDIOVERSION;  Surgeon: Laurey Morale, MD;  Location: [facility] ENDOSCOPY;  Service: Cardiovascular;  Laterality: N/A;   CARDIOVERSION N/A 12/03/2017   Procedure: CARDIOVERSION;  Surgeon: Laurey Morale, MD;  Location: [facility] ENDOSCOPY;  Service: Cardiovascular;  Laterality: N/A;   CARDIOVERSION N/A 05/02/2018   Procedure: CARDIOVERSION;  Surgeon: Chrystie Nose, MD;  Location: [facility] ENDOSCOPY;  Service: Cardiovascular;  Laterality: N/A;   CARDIOVERSION N/A 06/23/2020   Procedure: CARDIOVERSION;  Surgeon: Meriam Sprague, MD;  Location: [facility] ENDOSCOPY;  Service: Cardiovascular;  Laterality: N/A;   CARDIOVERSION N/A 10/11/2020   Procedure: CARDIOVERSION;  Surgeon: Laurey Morale, MD;  Location: [facility] ENDOSCOPY;  Service: Cardiovascular;  Laterality: N/A;   CARPAL TUNNEL RELEASE     CATARACT EXTRACTION W/ INTRAOCULAR LENS  IMPLANT, BILATERAL Bilateral    COLON SURGERY  1990   vein graft and colon repair after nicked artery with back  surgert   COLONOSCOPY     CORONARY ANGIOGRAPHY N/A 09/08/2020   Procedure: CORONARY ANGIOGRAPHY;  Surgeon: Laurey Morale, MD;  Location: [facility] INVASIVE CV LAB;  Service: Cardiovascular;  Laterality: N/A;   CYSTECTOMY     between bladder and kidneys   GANGLION CYST EXCISION Left    ICD IMPLANT N/A 09/15/2020   Procedure: ICD IMPLANT;  Surgeon: Regan Lemming, MD;  Location: [facility]VAS[facility] LAB;  Service: Cardiovascular;  Laterality: N/A;   KNEE CARTILAGE SURGERY Right 1960s   LUMBAR DISC SURGERY  1990   REPAIR ILIAC ARTERY  1990   vein graft and colon repair after nicked artery with back surgert   TEE WITHOUT CARDIOVERSION N/A 10/31/2013   Procedure: TRANSESOPHAGEAL ECHOCARDIOGRAM (TEE);  Surgeon: Laurey Morale, MD;  Location: [facility] ENDOSCOPY;  Service: Cardiovascular;  Laterality: N/A;   TUBAL LIGATION     Social History   Tobacco Use   Smoking status: Former    Current packs/day: 0.00    Average packs/day: 1 pack/day for 50.0 years (50.0 ttl pk-yrs)    Types: Cigarettes    Start date: 08/17/1970    Quit date: 08/17/2020  Years since quitting: 3.0   Smokeless tobacco: Never   Tobacco comments:    Quit 2022. Tay  Vaping Use   Vaping status: Never Used  Substance Use Topics   Alcohol use: No    Alcohol/week: 0.0 standard drinks of alcohol   Drug use: No   Social History   Socioeconomic History   Marital status: Married    Spouse name: Charles   Number of children: 2   Years of education: Not on file   Highest education level: Not on file  Occupational History   Occupation: Retired  Tobacco Use   Smoking status: Former    Current packs/day: 0.00    Average packs/day: 1 pack/day for 50.0 years (50.0 ttl pk-yrs)    Types: Cigarettes    Start date: 08/17/1970    Quit date: 08/17/2020    Years since quitting: 3.0   Smokeless tobacco: Never   Tobacco comments:    Quit 2022. Tay  Vaping Use   Vaping status: Never Used  Substance and Sexual Activity   Alcohol use: No     Alcohol/week: 0.0 standard drinks of alcohol   Drug use: No   Sexual activity: Not on file  Other Topics Concern   Not on file  Social History Narrative   Married x 67 years.    1 son and 1 daughter.   1 grandson.   Social Drivers of Health   Financial Resource Strain: Medium Risk (10/13/2021)   Overall Financial Resource Strain (CARDIA)    Difficulty of Paying Living Expenses: Somewhat hard  Food Insecurity: No Food Insecurity (05/23/2023)   Hunger Vital Sign    Worried About Running Out of Food in the Last Year: Never true    Ran Out of Food in the Last Year: Never true  Transportation Needs: No Transportation Needs (05/24/2023)   PRAPARE - Administrator, Civil Service (Medical): No    Lack of Transportation (Non-Medical): No  Physical Activity: Insufficiently Active (10/13/2021)   Exercise Vital Sign    Days of Exercise per Week: 2 days    Minutes of Exercise per Session: 20 min  Stress: No Stress Concern Present (10/13/2021)   [employer] of Occupational Health - Occupational Stress Questionnaire    Feeling of Stress : Not at all  Social Connections: Socially Integrated (10/13/2021)   Social Connection and Isolation Panel [NHANES]    Frequency of Communication with Friends and Family: More than three times a week    Frequency of Social Gatherings with Friends and Family: More than three times a week    Attends Religious Services: 1 to 4 times per year    Active Member of [employer] or Organizations: No    Attends Engineer, structural: 1 to 4 times per year    Marital Status: Married  Catering manager Violence: Not At Risk (05/16/2023)   Humiliation, Afraid, Rape, and Kick questionnaire    Fear of Current or Ex-Partner: No    Emotionally Abused: No    Physically Abused: No    Sexually Abused: No   Family Status  Relation Name Status   Mother  Deceased   Father  Deceased   Other  (Not Specified)   Other  (Not Specified)   Daughter  (Not Specified)    MGM  Deceased   MGF  Deceased   PGM  Deceased   PGF  Deceased  No partnership data on file   Family History  Problem Relation Age of Onset  Diabetes Mother    Hypertension Mother    Cancer Mother    Obesity Mother    Diabetes Father    Obesity Father    Diabetes Other    Melanoma Other    Factor V Leiden deficiency Daughter    Allergies  Allergen Reactions   Neomycin-Bacitracin Zn-Polymyx Rash   Sulfonamide Derivatives Other (See Comments)    Stomach cramps   Sulfa Antibiotics Other (See Comments)   Ciprofloxacin Hives, Itching and Rash    Pt doesn't remember having any reaction to cipro   Spironolactone Rash      Review of Systems  Constitutional:  Negative for fever and malaise/fatigue.  HENT:  Negative for congestion.   Eyes:  Negative for blurred vision.  Respiratory:  Negative for cough and shortness of breath.   Cardiovascular:  Negative for chest pain, palpitations and leg swelling.  Gastrointestinal:  Negative for abdominal pain, blood in stool, nausea and vomiting.  Genitourinary:  Negative for dysuria and frequency.  Musculoskeletal:  Negative for back pain and falls.  Skin:  Negative for rash.  Neurological:  Positive for weakness. Negative for dizziness, loss of consciousness and headaches.  Endo/Heme/Allergies:  Negative for environmental allergies.  Psychiatric/Behavioral:  Negative for depression. The patient is not nervous/anxious.       Objective:     BP 100/70 (BP Location: Left Arm, Patient Position: Sitting, Cuff Size: Large)   Pulse 78   Temp 97.8 F (36.6 C) (Oral)   Resp 18   Ht 5\' 6"  (1.676 m)   SpO2 95%   BMI 44.69 kg/m  BP Readings from Last 3 Encounters:  09/14/23 100/70  05/19/23 (!) 133/99  04/19/23 102/60   Wt Readings from Last 3 Encounters:  05/19/23 276 lb 14.4 oz (125.6 kg)  04/19/23 252 lb (114.3 kg)  04/03/23 260 lb (117.9 kg)   SpO2 Readings from Last 3 Encounters:  09/14/23 95%  05/19/23 99%  04/19/23 95%       Physical Exam Vitals and nursing note reviewed.  Constitutional:      General: She is not in acute distress.    Appearance: Normal appearance. She is well-developed.  HENT:     Head: Normocephalic and atraumatic.  Eyes:     General: No scleral icterus.       Right eye: No discharge.        Left eye: No discharge.  Cardiovascular:     Rate and Rhythm: Normal rate and regular rhythm.     Heart sounds: No murmur heard. Pulmonary:     Effort: Pulmonary effort is normal. No respiratory distress.     Breath sounds: Normal breath sounds.  Musculoskeletal:        General: Normal range of motion.     Cervical back: Normal range of motion and neck supple.     Right lower leg: No edema.     Left lower leg: No edema.  Skin:    General: Skin is warm and dry.  Neurological:     Mental Status: She is alert and oriented to person, place, and time.  Psychiatric:        Mood and Affect: Mood normal.        Behavior: Behavior normal.        Thought Content: Thought content normal.        Judgment: Judgment normal.      No results found for any visits on 09/14/23.  Last CBC Lab Results  Component Value Date  WBC 6.5 05/17/2023   HGB 12.9 05/17/2023   HCT 39.6 05/17/2023   MCV 91.7 05/17/2023   MCH 29.9 05/17/2023   RDW 15.5 05/17/2023   PLT 160 05/17/2023   Last metabolic panel Lab Results  Component Value Date   GLUCOSE 107 (H) 05/18/2023   NA 134 (L) 05/18/2023   K 3.9 05/18/2023   CL 96 (L) 05/18/2023   CO2 24 05/18/2023   BUN 23 05/18/2023   CREATININE 1.14 (H) 05/18/2023   GFRNONAA 48 (L) 05/18/2023   CALCIUM 7.8 (L) 05/18/2023   PHOS 2.5 05/18/2023   PROT 5.5 (L) 05/16/2023   ALBUMIN 2.7 (L) 05/18/2023   LABGLOB 3.1 06/24/2021   AGRATIO 1.4 06/24/2021   BILITOT 0.7 05/16/2023   ALKPHOS 33 (L) 05/16/2023   AST 37 05/16/2023   ALT 24 05/16/2023   ANIONGAP 14 05/18/2023   Last lipids Lab Results  Component Value Date   CHOL 159 04/19/2023   HDL 49  04/19/2023   LDLCALC 72 04/19/2023   LDLDIRECT 42.0 11/30/2014   TRIG 190 (H) 04/19/2023   CHOLHDL 3.2 04/19/2023   Last hemoglobin A1c Lab Results  Component Value Date   HGBA1C 6.7 (H) 05/13/2023   Last thyroid functions Lab Results  Component Value Date   TSH 0.865 12/06/2022   T3TOTAL 84 04/19/2023   T4TOTAL 11.5 04/19/2023   Last vitamin D Lab Results  Component Value Date   VD25OH 4.3 (L) 09/09/2018   Last vitamin B12 and Folate Lab Results  Component Value Date   VITAMINB12 325 09/09/2018      The ASCVD Risk score (Arnett DK, et al., 2019) failed to calculate for the following reasons:   The 2019 ASCVD risk score is only valid for ages 90 to 48    Assessment & Plan:   Problem List Items Addressed This Visit       Unprioritized   Systolic CHF (HCC)   Type 2 diabetes mellitus with hyperglycemia, with long-term current use of insulin (HCC)   Relevant Orders   Comprehensive metabolic panel   Hemoglobin A1c   Essential hypertension - Primary (Chronic)   Relevant Orders   Lipid panel   CBC with Differential/Platelet   Comprehensive metabolic panel   Encounter for assessment for deep vein thrombosis (DVT)   Well controlled, no changes to meds. Encouraged heart healthy diet such as the DASH diet and exercise as tolerated.        HLD (hyperlipidemia)   Encourage heart healthy diet such as MIND or DASH diet, increase exercise, avoid trans fats, simple carbohydrates and processed foods, consider a krill or fish or flaxseed oil cap daily.        Relevant Orders   Lipid panel   Hypothyroidism   Check labs       Paroxysmal atrial fibrillation [facility])   Per cardiology      Other Visit Diagnoses       PAF (paroxysmal atrial fibrillation) (HCC)         Depression with anxiety       Relevant Medications   sertraline (ZOLOFT) 25 MG tablet     Assessment and Plan    Depression Experiencing variable mood since husband's passing in September. Not on  medication for depression. Start Zoloft (sertraline) 25 mg daily to manage symptoms, with potential side effects like nausea. Zoloft is non-addictive compared to Ativan, aligning with preference for non-addictive medication. Follow up in one month, virtual if preferred.  Diabetes Mellitus Blood sugars are  well-controlled with no new sores or ulcers. Emphasized importance of monitoring for sores. Continue current diabetes management and monitor for new sores or ulcers.  Sepsis and Cellulitis Hospitalized in October for sepsis secondary to cellulitis on leg. No new sores or signs of infection. Use over-the-counter honey-based ointment for minor sores to avoid antibiotic resistance. Monitor for signs of infection.  General Health Maintenance Declined flu and COVID-19 vaccinations. No recent mammograms or bone density scans. Prefers current health status management. No flu shot, COVID-19 vaccine, mammogram, or bone density scan.  Follow-up Follow up in one month, virtual if preferred. Order comprehensive lab work.        Return in about 6 months (around 03/13/2024), or if symptoms worsen or fail to improve.    Donato Schultz, DO

## 2023-09-14 NOTE — Assessment & Plan Note (Signed)
 Per cardiology

## 2023-09-15 LAB — COMPREHENSIVE METABOLIC PANEL
AG Ratio: 1.3 (calc) (ref 1.0–2.5)
ALT: 14 U/L (ref 6–29)
AST: 33 U/L (ref 10–35)
Albumin: 3.9 g/dL (ref 3.6–5.1)
Alkaline phosphatase (APISO): 61 U/L (ref 37–153)
BUN/Creatinine Ratio: 16 (calc) (ref 6–22)
BUN: 23 mg/dL (ref 7–25)
CO2: 28 mmol/L (ref 20–32)
Calcium: 8.5 mg/dL — ABNORMAL LOW (ref 8.6–10.4)
Chloride: 98 mmol/L (ref 98–110)
Creat: 1.41 mg/dL — ABNORMAL HIGH (ref 0.60–0.95)
Globulin: 3 g/dL (ref 1.9–3.7)
Glucose, Bld: 117 mg/dL — ABNORMAL HIGH (ref 65–99)
Potassium: 3.8 mmol/L (ref 3.5–5.3)
Sodium: 138 mmol/L (ref 135–146)
Total Bilirubin: 0.6 mg/dL (ref 0.2–1.2)
Total Protein: 6.9 g/dL (ref 6.1–8.1)

## 2023-09-15 LAB — HEMOGLOBIN A1C
Hgb A1c MFr Bld: 6.5 %{Hb} — ABNORMAL HIGH (ref <5.7)
Mean Plasma Glucose: 140 mg/dL
eAG (mmol/L): 7.7 mmol/L

## 2023-09-15 LAB — CBC WITH DIFFERENTIAL/PLATELET
Absolute Lymphocytes: 3324 {cells}/uL (ref 850–3900)
Absolute Monocytes: 952 {cells}/uL — ABNORMAL HIGH (ref 200–950)
Basophils Absolute: 34 {cells}/uL (ref 0–200)
Basophils Relative: 0.4 %
Eosinophils Absolute: 68 {cells}/uL (ref 15–500)
Eosinophils Relative: 0.8 %
HCT: 50.3 % — ABNORMAL HIGH (ref 35.0–45.0)
Hemoglobin: 16.3 g/dL — ABNORMAL HIGH (ref 11.7–15.5)
MCH: 30 pg (ref 27.0–33.0)
MCHC: 32.4 g/dL (ref 32.0–36.0)
MCV: 92.6 fL (ref 80.0–100.0)
MPV: 10.9 fL (ref 7.5–12.5)
Monocytes Relative: 11.2 %
Neutro Abs: 4123 {cells}/uL (ref 1500–7800)
Neutrophils Relative %: 48.5 %
Platelets: 156 10*3/uL (ref 140–400)
RBC: 5.43 10*6/uL — ABNORMAL HIGH (ref 3.80–5.10)
RDW: 13.4 % (ref 11.0–15.0)
Total Lymphocyte: 39.1 %
WBC: 8.5 10*3/uL (ref 3.8–10.8)

## 2023-09-15 LAB — LIPID PANEL
Cholesterol: 152 mg/dL (ref <200)
HDL: 32 mg/dL — ABNORMAL LOW (ref >=50)
LDL Cholesterol (Calc): 83 mg/dL
Non-HDL Cholesterol (Calc): 120 mg/dL (ref <130)
Total CHOL/HDL Ratio: 4.8 (calc) (ref <5.0)
Triglycerides: 307 mg/dL — ABNORMAL HIGH (ref <150)

## 2023-09-19 ENCOUNTER — Encounter: Payer: Self-pay | Admitting: Family Medicine

## 2023-09-19 DIAGNOSIS — I4819 Other persistent atrial fibrillation: Secondary | ICD-10-CM | POA: Diagnosis not present

## 2023-09-19 DIAGNOSIS — N1832 Chronic kidney disease, stage 3b: Secondary | ICD-10-CM | POA: Diagnosis not present

## 2023-09-19 DIAGNOSIS — K8020 Calculus of gallbladder without cholecystitis without obstruction: Secondary | ICD-10-CM | POA: Diagnosis not present

## 2023-09-19 DIAGNOSIS — M19042 Primary osteoarthritis, left hand: Secondary | ICD-10-CM | POA: Diagnosis not present

## 2023-09-19 DIAGNOSIS — I700 Atherosclerosis of aorta: Secondary | ICD-10-CM | POA: Diagnosis not present

## 2023-09-19 DIAGNOSIS — D3502 Benign neoplasm of left adrenal gland: Secondary | ICD-10-CM | POA: Diagnosis not present

## 2023-09-19 DIAGNOSIS — M19032 Primary osteoarthritis, left wrist: Secondary | ICD-10-CM | POA: Diagnosis not present

## 2023-09-19 DIAGNOSIS — M19041 Primary osteoarthritis, right hand: Secondary | ICD-10-CM | POA: Diagnosis not present

## 2023-09-19 DIAGNOSIS — I2510 Atherosclerotic heart disease of native coronary artery without angina pectoris: Secondary | ICD-10-CM | POA: Diagnosis not present

## 2023-09-19 DIAGNOSIS — I4510 Unspecified right bundle-branch block: Secondary | ICD-10-CM | POA: Diagnosis not present

## 2023-09-19 DIAGNOSIS — E039 Hypothyroidism, unspecified: Secondary | ICD-10-CM | POA: Diagnosis not present

## 2023-09-19 DIAGNOSIS — E1122 Type 2 diabetes mellitus with diabetic chronic kidney disease: Secondary | ICD-10-CM | POA: Diagnosis not present

## 2023-09-19 DIAGNOSIS — M48061 Spinal stenosis, lumbar region without neurogenic claudication: Secondary | ICD-10-CM | POA: Diagnosis not present

## 2023-09-19 DIAGNOSIS — I472 Ventricular tachycardia, unspecified: Secondary | ICD-10-CM | POA: Diagnosis not present

## 2023-09-19 DIAGNOSIS — I440 Atrioventricular block, first degree: Secondary | ICD-10-CM | POA: Diagnosis not present

## 2023-09-19 DIAGNOSIS — I4892 Unspecified atrial flutter: Secondary | ICD-10-CM | POA: Diagnosis not present

## 2023-09-19 DIAGNOSIS — J449 Chronic obstructive pulmonary disease, unspecified: Secondary | ICD-10-CM | POA: Diagnosis not present

## 2023-09-19 DIAGNOSIS — M19031 Primary osteoarthritis, right wrist: Secondary | ICD-10-CM | POA: Diagnosis not present

## 2023-09-19 DIAGNOSIS — G8929 Other chronic pain: Secondary | ICD-10-CM | POA: Diagnosis not present

## 2023-09-19 DIAGNOSIS — M47816 Spondylosis without myelopathy or radiculopathy, lumbar region: Secondary | ICD-10-CM | POA: Diagnosis not present

## 2023-09-19 DIAGNOSIS — I5033 Acute on chronic diastolic (congestive) heart failure: Secondary | ICD-10-CM | POA: Diagnosis not present

## 2023-09-19 DIAGNOSIS — G4733 Obstructive sleep apnea (adult) (pediatric): Secondary | ICD-10-CM | POA: Diagnosis not present

## 2023-09-19 DIAGNOSIS — K429 Umbilical hernia without obstruction or gangrene: Secondary | ICD-10-CM | POA: Diagnosis not present

## 2023-09-19 DIAGNOSIS — M4807 Spinal stenosis, lumbosacral region: Secondary | ICD-10-CM | POA: Diagnosis not present

## 2023-09-19 DIAGNOSIS — I130 Hypertensive heart and chronic kidney disease with heart failure and stage 1 through stage 4 chronic kidney disease, or unspecified chronic kidney disease: Secondary | ICD-10-CM | POA: Diagnosis not present

## 2023-09-21 ENCOUNTER — Ambulatory Visit (INDEPENDENT_AMBULATORY_CARE_PROVIDER_SITE_OTHER): Payer: Medicare Other

## 2023-09-21 DIAGNOSIS — I472 Ventricular tachycardia, unspecified: Secondary | ICD-10-CM | POA: Diagnosis not present

## 2023-09-26 ENCOUNTER — Telehealth: Payer: Self-pay | Admitting: Cardiology

## 2023-09-26 LAB — CUP PACEART REMOTE DEVICE CHECK
Battery Remaining Longevity: 112 mo
Battery Voltage: 3.01 V
Brady Statistic RV Percent Paced: 0.15 %
Date Time Interrogation Session: 20250312091956
HighPow Impedance: 83 Ohm
Implantable Lead Connection Status: 753985
Implantable Lead Implant Date: 20220302
Implantable Lead Location: 753860
Implantable Pulse Generator Implant Date: 20220302
Lead Channel Impedance Value: 1634 Ohm
Lead Channel Impedance Value: 1748 Ohm
Lead Channel Pacing Threshold Amplitude: 2 V
Lead Channel Pacing Threshold Pulse Width: 0.4 ms
Lead Channel Sensing Intrinsic Amplitude: 9.5 mV
Lead Channel Sensing Intrinsic Amplitude: 9.5 mV
Lead Channel Setting Pacing Amplitude: 4.5 V
Lead Channel Setting Pacing Pulse Width: 0.4 ms
Lead Channel Setting Sensing Sensitivity: 0.3 mV
Zone Setting Status: 755011
Zone Setting Status: 755011

## 2023-09-26 NOTE — Telephone Encounter (Signed)
  1. Has your device fired? no  2. Is you device beeping? no  3. Are you experiencing draining or swelling at device site? no  4. Are you calling to see if we received your device transmission? Yes  5. Have you passed out?    Please route to Device Clinic Pool

## 2023-09-26 NOTE — Telephone Encounter (Signed)
 Called patient to inform her transmission was received and pending review. Pt was appreciative of call back.

## 2023-09-27 DIAGNOSIS — N1832 Chronic kidney disease, stage 3b: Secondary | ICD-10-CM | POA: Diagnosis not present

## 2023-09-27 DIAGNOSIS — I5033 Acute on chronic diastolic (congestive) heart failure: Secondary | ICD-10-CM | POA: Diagnosis not present

## 2023-09-27 DIAGNOSIS — I4819 Other persistent atrial fibrillation: Secondary | ICD-10-CM | POA: Diagnosis not present

## 2023-09-27 DIAGNOSIS — I130 Hypertensive heart and chronic kidney disease with heart failure and stage 1 through stage 4 chronic kidney disease, or unspecified chronic kidney disease: Secondary | ICD-10-CM | POA: Diagnosis not present

## 2023-09-27 DIAGNOSIS — E1122 Type 2 diabetes mellitus with diabetic chronic kidney disease: Secondary | ICD-10-CM | POA: Diagnosis not present

## 2023-10-01 DIAGNOSIS — I5033 Acute on chronic diastolic (congestive) heart failure: Secondary | ICD-10-CM | POA: Diagnosis not present

## 2023-10-01 DIAGNOSIS — E1122 Type 2 diabetes mellitus with diabetic chronic kidney disease: Secondary | ICD-10-CM | POA: Diagnosis not present

## 2023-10-01 DIAGNOSIS — I4819 Other persistent atrial fibrillation: Secondary | ICD-10-CM | POA: Diagnosis not present

## 2023-10-01 DIAGNOSIS — N1832 Chronic kidney disease, stage 3b: Secondary | ICD-10-CM | POA: Diagnosis not present

## 2023-10-01 DIAGNOSIS — I130 Hypertensive heart and chronic kidney disease with heart failure and stage 1 through stage 4 chronic kidney disease, or unspecified chronic kidney disease: Secondary | ICD-10-CM | POA: Diagnosis not present

## 2023-10-03 DIAGNOSIS — E1122 Type 2 diabetes mellitus with diabetic chronic kidney disease: Secondary | ICD-10-CM | POA: Diagnosis not present

## 2023-10-03 DIAGNOSIS — I5033 Acute on chronic diastolic (congestive) heart failure: Secondary | ICD-10-CM | POA: Diagnosis not present

## 2023-10-03 DIAGNOSIS — I130 Hypertensive heart and chronic kidney disease with heart failure and stage 1 through stage 4 chronic kidney disease, or unspecified chronic kidney disease: Secondary | ICD-10-CM | POA: Diagnosis not present

## 2023-10-03 DIAGNOSIS — N1832 Chronic kidney disease, stage 3b: Secondary | ICD-10-CM | POA: Diagnosis not present

## 2023-10-03 DIAGNOSIS — I4819 Other persistent atrial fibrillation: Secondary | ICD-10-CM | POA: Diagnosis not present

## 2023-10-08 DIAGNOSIS — E1122 Type 2 diabetes mellitus with diabetic chronic kidney disease: Secondary | ICD-10-CM | POA: Diagnosis not present

## 2023-10-08 DIAGNOSIS — N1832 Chronic kidney disease, stage 3b: Secondary | ICD-10-CM | POA: Diagnosis not present

## 2023-10-08 DIAGNOSIS — I5033 Acute on chronic diastolic (congestive) heart failure: Secondary | ICD-10-CM | POA: Diagnosis not present

## 2023-10-08 DIAGNOSIS — I130 Hypertensive heart and chronic kidney disease with heart failure and stage 1 through stage 4 chronic kidney disease, or unspecified chronic kidney disease: Secondary | ICD-10-CM | POA: Diagnosis not present

## 2023-10-08 DIAGNOSIS — I4819 Other persistent atrial fibrillation: Secondary | ICD-10-CM | POA: Diagnosis not present

## 2023-10-10 ENCOUNTER — Other Ambulatory Visit (HOSPITAL_COMMUNITY): Payer: Self-pay | Admitting: Cardiology

## 2023-10-10 ENCOUNTER — Telehealth (HOSPITAL_COMMUNITY): Payer: Self-pay

## 2023-10-10 NOTE — Telephone Encounter (Signed)
 Called to confirm/remind patient of their appointment [facility] on 10/11/23***.   Appointment:   [x] Confirmed  [] Left mess   [] No answer/No voice mail  [] Phone not in service  Patient reminded to bring all medications and/or complete list.  Confirmed patient has transportation. Gave directions, instructed to utilize valet parking.

## 2023-10-11 ENCOUNTER — Ambulatory Visit (HOSPITAL_COMMUNITY)
Admission: RE | Admit: 2023-10-11 | Discharge: 2023-10-11 | Disposition: A | Discharge status: Home / Self Care | Payer: Medicare Other | Source: Ambulatory Visit | Attending: Cardiology | Admitting: Cardiology

## 2023-10-11 ENCOUNTER — Encounter (HOSPITAL_COMMUNITY): Payer: Self-pay

## 2023-10-11 VITALS — BP 108/66 | HR 61 | Wt 256.4 lb

## 2023-10-11 DIAGNOSIS — N1832 Chronic kidney disease, stage 3b: Secondary | ICD-10-CM | POA: Diagnosis not present

## 2023-10-11 DIAGNOSIS — E1122 Type 2 diabetes mellitus with diabetic chronic kidney disease: Secondary | ICD-10-CM | POA: Insufficient documentation

## 2023-10-11 DIAGNOSIS — Z4502 Encounter for adjustment and management of automatic implantable cardiac defibrillator: Secondary | ICD-10-CM | POA: Insufficient documentation

## 2023-10-11 DIAGNOSIS — M549 Dorsalgia, unspecified: Secondary | ICD-10-CM | POA: Insufficient documentation

## 2023-10-11 DIAGNOSIS — Z7901 Long term (current) use of anticoagulants: Secondary | ICD-10-CM | POA: Insufficient documentation

## 2023-10-11 DIAGNOSIS — E669 Obesity, unspecified: Secondary | ICD-10-CM | POA: Insufficient documentation

## 2023-10-11 DIAGNOSIS — I130 Hypertensive heart and chronic kidney disease with heart failure and stage 1 through stage 4 chronic kidney disease, or unspecified chronic kidney disease: Secondary | ICD-10-CM | POA: Diagnosis not present

## 2023-10-11 DIAGNOSIS — Z7984 Long term (current) use of oral hypoglycemic drugs: Secondary | ICD-10-CM | POA: Diagnosis not present

## 2023-10-11 DIAGNOSIS — G4733 Obstructive sleep apnea (adult) (pediatric): Secondary | ICD-10-CM | POA: Diagnosis not present

## 2023-10-11 DIAGNOSIS — E079 Disorder of thyroid, unspecified: Secondary | ICD-10-CM | POA: Diagnosis not present

## 2023-10-11 DIAGNOSIS — I5032 Chronic diastolic (congestive) heart failure: Secondary | ICD-10-CM | POA: Diagnosis not present

## 2023-10-11 DIAGNOSIS — N280 Ischemia and infarction of kidney: Secondary | ICD-10-CM | POA: Diagnosis not present

## 2023-10-11 DIAGNOSIS — I491 Atrial premature depolarization: Secondary | ICD-10-CM | POA: Diagnosis not present

## 2023-10-11 DIAGNOSIS — Z79890 Hormone replacement therapy (postmenopausal): Secondary | ICD-10-CM | POA: Insufficient documentation

## 2023-10-11 DIAGNOSIS — Z6841 Body Mass Index (BMI) 40.0 and over, adult: Secondary | ICD-10-CM | POA: Insufficient documentation

## 2023-10-11 DIAGNOSIS — E785 Hyperlipidemia, unspecified: Secondary | ICD-10-CM | POA: Diagnosis not present

## 2023-10-11 DIAGNOSIS — I472 Ventricular tachycardia, unspecified: Secondary | ICD-10-CM | POA: Diagnosis not present

## 2023-10-11 DIAGNOSIS — I739 Peripheral vascular disease, unspecified: Secondary | ICD-10-CM | POA: Diagnosis not present

## 2023-10-11 DIAGNOSIS — E1151 Type 2 diabetes mellitus with diabetic peripheral angiopathy without gangrene: Secondary | ICD-10-CM | POA: Diagnosis not present

## 2023-10-11 DIAGNOSIS — I2510 Atherosclerotic heart disease of native coronary artery without angina pectoris: Secondary | ICD-10-CM | POA: Diagnosis not present

## 2023-10-11 DIAGNOSIS — Z794 Long term (current) use of insulin: Secondary | ICD-10-CM | POA: Diagnosis not present

## 2023-10-11 DIAGNOSIS — I480 Paroxysmal atrial fibrillation: Secondary | ICD-10-CM | POA: Diagnosis not present

## 2023-10-11 DIAGNOSIS — I4819 Other persistent atrial fibrillation: Secondary | ICD-10-CM | POA: Diagnosis not present

## 2023-10-11 DIAGNOSIS — Z87891 Personal history of nicotine dependence: Secondary | ICD-10-CM | POA: Diagnosis not present

## 2023-10-11 DIAGNOSIS — I4892 Unspecified atrial flutter: Secondary | ICD-10-CM | POA: Diagnosis not present

## 2023-10-11 DIAGNOSIS — Z79899 Other long term (current) drug therapy: Secondary | ICD-10-CM | POA: Insufficient documentation

## 2023-10-11 DIAGNOSIS — E66813 Obesity, class 3: Secondary | ICD-10-CM

## 2023-10-11 DIAGNOSIS — J449 Chronic obstructive pulmonary disease, unspecified: Secondary | ICD-10-CM | POA: Insufficient documentation

## 2023-10-11 DIAGNOSIS — Z95820 Peripheral vascular angioplasty status with implants and grafts: Secondary | ICD-10-CM | POA: Insufficient documentation

## 2023-10-11 DIAGNOSIS — E039 Hypothyroidism, unspecified: Secondary | ICD-10-CM | POA: Diagnosis not present

## 2023-10-11 DIAGNOSIS — I440 Atrioventricular block, first degree: Secondary | ICD-10-CM | POA: Insufficient documentation

## 2023-10-11 LAB — COMPREHENSIVE METABOLIC PANEL WITH GFR
ALT: 15 U/L (ref 0–44)
AST: 33 U/L (ref 15–41)
Albumin: 3.4 g/dL — ABNORMAL LOW (ref 3.5–5.0)
Alkaline Phosphatase: 43 U/L (ref 38–126)
Anion gap: 13 (ref 5–15)
BUN: 22 mg/dL (ref 8–23)
CO2: 25 mmol/L (ref 22–32)
Calcium: 8.2 mg/dL — ABNORMAL LOW (ref 8.9–10.3)
Chloride: 100 mmol/L (ref 98–111)
Creatinine, Ser: 1.46 mg/dL — ABNORMAL HIGH (ref 0.44–1.00)
GFR, Estimated: 36 mL/min — ABNORMAL LOW (ref >60)
Glucose, Bld: 99 mg/dL (ref 70–99)
Potassium: 3.6 mmol/L (ref 3.5–5.1)
Sodium: 138 mmol/L (ref 135–145)
Total Bilirubin: 0.9 mg/dL (ref 0.0–1.2)
Total Protein: 6.9 g/dL (ref 6.5–8.1)

## 2023-10-11 LAB — BRAIN NATRIURETIC PEPTIDE: B Natriuretic Peptide: 166.6 pg/mL — ABNORMAL HIGH (ref 0.0–100.0)

## 2023-10-11 LAB — TSH: TSH: 13.108 u[IU]/mL — ABNORMAL HIGH (ref 0.350–4.500)

## 2023-10-11 LAB — T4, FREE: Free T4: 1.09 ng/dL (ref 0.61–1.12)

## 2023-10-11 NOTE — Addendum Note (Signed)
 Encounter addended by: Suzan Manon, [place], NP on: 10/11/2023 4:24 PM  Actions taken: Clinical Note Signed

## 2023-10-11 NOTE — Progress Notes (Addendum)
 Advanced Heart Failure Clinic Note  PCP:  Donato Schultz, DO  HF Cardiologist: Dr. Shirlee Latch  Reason for Visit: Heart Failure Follow-up HPI: Heidi Stark is a 83 y.o. 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   She has left leg pain with ambulation after about 100 feet, LE dopplers 5/15 showed occluded left external iliac artery. This has been present ever since she had left iliac damage with a prior back surgery.     Admitted to [facility] 1/18 with AF/RVR and acute systolic CHF.  EF was 30-35%, down from 50-55% in 2015. Underwent DCCV back to NSR.  She subsequently had atrial fibrillation ablation in 4/18.  She went back into AF in 5/19 and had DCCV back to NSR.   Echo 6/19 with EF 55%, mild LVH, moderate diastolic dysfunction, normal RV size and systolic function.    Admitted for recurrent AF 10/19 to start Tikosyn. She was cardioverted back to NSR. Tikosyn stopped in 2/22 d/t VT and presyncope. Started on amio.  EF at the time 50-55%, but cMRI showed EF 40% with subendocardial scar in the basal to mid inferolateral wall, RV EF was 38%. LHC without obstructive disease.   Readmitted 2/22 with presyncopal events. No VT but continued atypical AF. S/p Medtronic ICD 3/22. Discharge weight 256 lbs.   ERAF at follow up s/p another DCCV 3/22.  Multiple episodes of VT on 2/23, received ATP but no shocks. Started on mexiletine 250 bid.  On 5/23, she had VT with ATP and 1 ICD shock. Had been off amio and mex.  Echo in 8/23 showed EF 60-65%, moderate LVH, normal RV, normal IVC.   She was noted to be in AF in 4/24, back in NSR when she came in for DCCV.   She returns today for HF follow up, arrived by wheelchair. Overall feeling well. NYHA III. Denies chest pain, near-syncope, orthopnea, palpitations, and abnormal bleeding . Reports generalized fatigue and back pain. She is down 20 lbs since last visit. Appetite okay. Able to perform ADLs  with rollator. Compliant with all medications.  Current Outpatient Medications  Medication Sig Dispense Refill   acetaminophen (TYLENOL) 500 MG tablet Take 500 mg by mouth every 6 (six) hours as needed for moderate pain (pain score 4-6) or headache.     amiodarone (PACERONE) 200 MG tablet TAKE 1 TABLET BY MOUTH EVERY DAY 90 tablet 3   BD PEN NEEDLE NANO 2ND GEN 32G X 4 MM MISC See admin instructions.     dapagliflozin propanediol (FARXIGA) 10 MG TABS tablet TAKE 1 TABLET BY MOUTH EVERY DAY BEFORE BREAKFAST 90 tablet 3   ELIQUIS 5 MG TABS tablet TAKE 1 TABLET BY MOUTH TWICE A DAY 60 tablet 11   eplerenone (INSPRA) 25 MG tablet TAKE 1 TABLET BY MOUTH EVERY DAY 90 tablet 3   fenofibrate (TRICOR) 145 MG tablet TAKE 1 TABLET BY MOUTH EVERY DAY 90 tablet 3   furosemide (LASIX) 40 MG tablet Take 1 tablet (40 mg total) by mouth 2 (two) times daily.     gabapentin (NEURONTIN) 300 MG capsule Take 300 mg by mouth daily as needed (pain).     HUMALOG MIX 75/25 KWIKPEN (75-25) 100 UNIT/ML KwikPen Take 40 units with morning meal and 30 units with evening meal (Patient taking differently: Inject 30 Units into the skin 2 (two) times daily.) 90 mL 2   levothyroxine (SYNTHROID) 25 MCG tablet 25 mcg 3 (three) times  a week.     LIVALO 4 MG TABS TAKE 1 TABLET BY MOUTH EVERY DAY 90 tablet 3   LORazepam (ATIVAN) 0.5 MG tablet Take 1 tablet (0.5 mg total) by mouth at bedtime as needed for anxiety (sleep.). 15 tablet 0   metoprolol succinate (TOPROL-XL) 25 MG 24 hr tablet TAKE 1 TABLET BY MOUTH EVERYDAY AT BEDTIME 90 tablet 3   mexiletine (MEXITIL) 150 MG capsule TAKE 2 CAPSULES (300 MG TOTAL) BY MOUTH 2 (TWO) TIMES DAILY. 360 capsule 3   Multiple Vitamin (MULTIVITAMIN WITH MINERALS) TABS tablet Take 1 tablet by mouth daily.     ondansetron (ZOFRAN-ODT) 8 MG disintegrating tablet Take 1 tablet (8 mg total) by mouth every 8 (eight) hours as needed for nausea or vomiting. 20 tablet 0   ONETOUCH VERIO test strip 1 each 3  (three) times daily.     potassium chloride (KLOR-CON M10) 10 MEQ tablet Take 2 tablets (20 mEq total) by mouth 2 (two) times daily. NEEDS FOLLOW UP APPOINTMENT FOR MORE REFILLS 180 tablet 0   rOPINIRole (REQUIP) 0.25 MG tablet Take 0.25 mg by mouth as needed.     sertraline (ZOLOFT) 25 MG tablet Take 1 tablet (25 mg total) by mouth daily. 30 tablet 3   VASCEPA 1 g capsule TAKE 2 CAPSULES BY MOUTH TWICE A DAY 120 capsule 5   No current facility-administered medications for this encounter.    Allergies:   Neomycin-bacitracin zn-polymyx, Sulfonamide derivatives, Sulfa antibiotics, Ciprofloxacin, and Spironolactone   Social History:  The patient  reports that she quit smoking about 3 years ago. Her smoking use included cigarettes. She started smoking about 53 years ago. She has a 50 pack-year smoking history. She has never used smokeless tobacco. She reports that she does not drink alcohol and does not use drugs.   Family History:  The patient's family history includes Cancer in her mother; Diabetes in her father, mother, and another family member; Factor V Leiden deficiency in her daughter; Hypertension in her mother; Melanoma in an other family member; Obesity in her father and mother.   BP 108/66   Pulse 61   Wt 116.3 kg (256 lb 6.4 oz)   SpO2 93%   BMI 41.38 kg/m   Wt Readings from Last 3 Encounters:  10/11/23 116.3 kg (256 lb 6.4 oz)  05/19/23 125.6 kg (276 lb 14.4 oz)  04/19/23 114.3 kg (252 lb)   PHYSICAL EXAM: General: Weak appearing. No distress on RA. Arrived by [facility] Cardiac: JVP flat. S1 and S2 present. No murmurs or rub. Resp: Lung sounds clear and equal B/L Abdomen: Soft, obese, non-tender, non-distended.  Extremities: Warm and dry.  No edema. Red, ruddy BLE. Neuro: Alert and oriented x3. Affect pleasant. Generalized weakness  Medtronic Device Interrogation (personally reviewed): In AF 1.9h/day (7.8%), 99% VS, 0.3 VP, No VT, 0.0 hr/day activity. Optivol below threshold.  Days with high rates of AF correlate with increase in volume on OptiVol.   ECG (personally reviewed): SR 66 bpm with 1AVB with RBBB, QRS 154 ms, PR 274 ms  ASSESSMENT AND PLAN: 1. 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 Had been on Tikosyn but this was not the typical Tikosyn-induced polymorphic VT and she had had no changes in Tikosyn dosing prior to occurrence. cMRI showed subendocardial LGE in the basal to mid inferolateral wall. This appeared to be a coronary disease pattern however coronary angiogram did not show significant coronary disease. She was transitioned from Tikosyn to amiodarone and  got Medtronic single chamber ICD on 09/15/20. VT on 2/23 and was started on mexiletine. VT again on 5/23 after missing amiodarone and mexiletine. No VT on device interrogation. - Continue amiodarone 200 mg daily. Check TSH, Free T3/T4, HFTs today. Gets regular eye exams.  - Continue mexiletine 300 mg bid.  2. Atrial fibrillation/flutter: Paroxysmal. 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  She had atrial fibrillation ablation in 4/18.  In 5/19, she went into atrial fibrillation transiently. In 10/19, she was started on Tikosyn and DCCV.  Now off Tikosyn and on amiodarone. NSR on EKG 9/17. Multiple DCCVs with ERAF. Asymptomatic, rate controlled. High AF burden correlates with Optivol rises on device interrogation. Rhythm favors being dry.  - Device interrogation showed AF burden decrease 16.9%>7.8% burden compares to last interrogation 3/12.   - Followed by Dr. Elberta Fortis - Continue amiodarone 200 mg daily - Continue Eliquis 5 mg bid. Denies abnormal bleeding. (Dose reduction pending renal function, currently Cr <1.5)  3. PAD: Left leg claudication with absent left PT pulse.  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 CTA in 5/24 showed right anterior  tibial and peroneal arteries occluded, left CIA/EIA/internal iliac artery occluded with reconstitution of SFA.  - Follows w [facility] (Dr. Randie Heinz).   4. Chronic diastolic CHF: EF 16-10% on echo in 1/18, down from 50-55% in 4/15. 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 In 2/22, cMRI showed LV EF 40% with RV E 38%, inferolateral subendocardial LGE, but echo in 2/22 showed EF 50-55%.  Echo in 8/23 showed EF 60-65%, moderate LVH, normal RV, normal IVC.   - NYHA Class III symptoms, confounded by age and deconditioning.  - Euvolemic on exam and by device interrogation. Wt down 20 lb since Nov. Fluid index < threshold.  - Continue Entresto 24/26 mg bid.  Unable to titrate d/t BP - Continue Lasix 40 mg bid. - Continue Toprol XL 25 mg daily.   - Continue eplerenone 25 mg daily.  - Continue dapagliflozin 10 mg daily.  - Check BNP/BMET today   5. CAD: Coronary CTA in 2018 showed coronary calcium score in the 82nd percentile with nonobstructive coronary disease. Cath in 2/22 showed only luminal irregularities. She denies anginal symptoms.  - Not on ASA due to Eliquis.  - Continue Vascepa and Livalo. LDL 83, TG 307 (3/25). Check HFTs today   6. COPD:  History of smoking. Quit 2/22.  7. OSA: Continue CPAP.  8. Obesity: Body mass index is 41.38 kg/m. - Unable to tolerate semaglutide due to nausea. - Unable to afford Mounjaro co-pay 9. CKD Stage IIIb: Baseline SCr ~1.4-1.6. BMET today. 10. Thyroid: H/o hypothyroidism on Synthroid. 5/24: Check TSH, T3/T4  Follow up in 3 months with Dr. Shirlee Latch + echo  [place] Sherri Mcarthy, NP  10/11/2023

## 2023-10-11 NOTE — Patient Instructions (Signed)
 No medication changes today. Labs today  - will call you if abnormal. Return to see Dr. Shirlee Latch with echo in 3 months. Please call us at [phone]  in May to schedule this appointment. Please call us at [phone] if any questions or concerns prior to your next visit.

## 2023-10-12 DIAGNOSIS — I130 Hypertensive heart and chronic kidney disease with heart failure and stage 1 through stage 4 chronic kidney disease, or unspecified chronic kidney disease: Secondary | ICD-10-CM | POA: Diagnosis not present

## 2023-10-12 DIAGNOSIS — E1122 Type 2 diabetes mellitus with diabetic chronic kidney disease: Secondary | ICD-10-CM | POA: Diagnosis not present

## 2023-10-12 DIAGNOSIS — N1832 Chronic kidney disease, stage 3b: Secondary | ICD-10-CM | POA: Diagnosis not present

## 2023-10-12 DIAGNOSIS — I4819 Other persistent atrial fibrillation: Secondary | ICD-10-CM | POA: Diagnosis not present

## 2023-10-12 DIAGNOSIS — I5033 Acute on chronic diastolic (congestive) heart failure: Secondary | ICD-10-CM | POA: Diagnosis not present

## 2023-10-12 LAB — T3, FREE: T3, Free: 2.2 pg/mL (ref 2.0–4.4)

## 2023-10-14 ENCOUNTER — Other Ambulatory Visit: Payer: Self-pay | Admitting: Family Medicine

## 2023-10-14 DIAGNOSIS — F418 Other specified anxiety disorders: Secondary | ICD-10-CM

## 2023-10-15 ENCOUNTER — Telehealth (HOSPITAL_COMMUNITY): Payer: Self-pay

## 2023-10-15 NOTE — Telephone Encounter (Signed)
 Spoke with patient regarding the following results. Patient made aware and patient verbalized understanding.   Patient reports that Dr. Talmage Nap manages her Levothyroxine. Patient asked that these be faxed over to her. Advised patient that these results have been faxed via Epic Fax Function. Also advised patient to reach out to their office as well. Patient aware and verbalized understanding.

## 2023-10-15 NOTE — Telephone Encounter (Signed)
-----   Message from [place] Lee sent at 10/15/2023  8:05 AM EDT ----- TSH elevated, T4/T3 okay. May need to go up on Levothyroxine.

## 2023-10-17 ENCOUNTER — Other Ambulatory Visit (HOSPITAL_COMMUNITY): Payer: Self-pay | Admitting: Cardiology

## 2023-10-18 ENCOUNTER — Other Ambulatory Visit: Payer: Self-pay | Admitting: Cardiology

## 2023-10-22 NOTE — Progress Notes (Signed)
 Remote ICD transmission.

## 2023-10-23 NOTE — Addendum Note (Signed)
 Addended by: Elease Etienne A on: 10/23/2023 10:21 AM   Modules accepted: Orders

## 2023-10-25 ENCOUNTER — Other Ambulatory Visit: Payer: Self-pay | Admitting: Cardiology

## 2023-10-25 DIAGNOSIS — N1832 Chronic kidney disease, stage 3b: Secondary | ICD-10-CM | POA: Diagnosis not present

## 2023-10-25 DIAGNOSIS — I5033 Acute on chronic diastolic (congestive) heart failure: Secondary | ICD-10-CM | POA: Diagnosis not present

## 2023-10-25 DIAGNOSIS — I130 Hypertensive heart and chronic kidney disease with heart failure and stage 1 through stage 4 chronic kidney disease, or unspecified chronic kidney disease: Secondary | ICD-10-CM | POA: Diagnosis not present

## 2023-10-25 DIAGNOSIS — E1122 Type 2 diabetes mellitus with diabetic chronic kidney disease: Secondary | ICD-10-CM | POA: Diagnosis not present

## 2023-10-25 DIAGNOSIS — I4819 Other persistent atrial fibrillation: Secondary | ICD-10-CM | POA: Diagnosis not present

## 2023-11-08 ENCOUNTER — Encounter (HOSPITAL_BASED_OUTPATIENT_CLINIC_OR_DEPARTMENT_OTHER): Payer: Self-pay

## 2023-11-08 ENCOUNTER — Other Ambulatory Visit: Payer: Self-pay

## 2023-11-08 ENCOUNTER — Emergency Department (HOSPITAL_BASED_OUTPATIENT_CLINIC_OR_DEPARTMENT_OTHER)
Admission: EM | Admit: 2023-11-08 | Discharge: 2023-11-08 | Disposition: A | Discharge status: Home / Self Care | Source: Home / Self Care | Attending: Emergency Medicine | Admitting: Emergency Medicine

## 2023-11-08 ENCOUNTER — Emergency Department (HOSPITAL_BASED_OUTPATIENT_CLINIC_OR_DEPARTMENT_OTHER)

## 2023-11-08 DIAGNOSIS — Z7901 Long term (current) use of anticoagulants: Secondary | ICD-10-CM | POA: Diagnosis not present

## 2023-11-08 DIAGNOSIS — J449 Chronic obstructive pulmonary disease, unspecified: Secondary | ICD-10-CM | POA: Diagnosis not present

## 2023-11-08 DIAGNOSIS — Z95810 Presence of automatic (implantable) cardiac defibrillator: Secondary | ICD-10-CM | POA: Diagnosis not present

## 2023-11-08 DIAGNOSIS — M79672 Pain in left foot: Secondary | ICD-10-CM | POA: Diagnosis not present

## 2023-11-08 DIAGNOSIS — I959 Hypotension, unspecified: Secondary | ICD-10-CM | POA: Diagnosis not present

## 2023-11-08 DIAGNOSIS — I472 Ventricular tachycardia, unspecified: Secondary | ICD-10-CM | POA: Diagnosis not present

## 2023-11-08 DIAGNOSIS — E039 Hypothyroidism, unspecified: Secondary | ICD-10-CM | POA: Diagnosis not present

## 2023-11-08 DIAGNOSIS — L03116 Cellulitis of left lower limb: Secondary | ICD-10-CM | POA: Diagnosis not present

## 2023-11-08 DIAGNOSIS — W19XXXA Unspecified fall, initial encounter: Secondary | ICD-10-CM | POA: Diagnosis not present

## 2023-11-08 DIAGNOSIS — E1122 Type 2 diabetes mellitus with diabetic chronic kidney disease: Secondary | ICD-10-CM | POA: Diagnosis not present

## 2023-11-08 DIAGNOSIS — Z906 Acquired absence of other parts of urinary tract: Secondary | ICD-10-CM | POA: Diagnosis not present

## 2023-11-08 DIAGNOSIS — M810 Age-related osteoporosis without current pathological fracture: Secondary | ICD-10-CM | POA: Diagnosis not present

## 2023-11-08 DIAGNOSIS — Z881 Allergy status to other antibiotic agents status: Secondary | ICD-10-CM | POA: Diagnosis not present

## 2023-11-08 DIAGNOSIS — E785 Hyperlipidemia, unspecified: Secondary | ICD-10-CM | POA: Diagnosis not present

## 2023-11-08 DIAGNOSIS — I130 Hypertensive heart and chronic kidney disease with heart failure and stage 1 through stage 4 chronic kidney disease, or unspecified chronic kidney disease: Secondary | ICD-10-CM | POA: Diagnosis not present

## 2023-11-08 DIAGNOSIS — W109XXA Fall (on) (from) unspecified stairs and steps, initial encounter: Secondary | ICD-10-CM | POA: Insufficient documentation

## 2023-11-08 DIAGNOSIS — N1832 Chronic kidney disease, stage 3b: Secondary | ICD-10-CM | POA: Diagnosis not present

## 2023-11-08 DIAGNOSIS — Z8249 Family history of ischemic heart disease and other diseases of the circulatory system: Secondary | ICD-10-CM | POA: Diagnosis not present

## 2023-11-08 DIAGNOSIS — S8391XA Sprain of unspecified site of right knee, initial encounter: Secondary | ICD-10-CM | POA: Diagnosis not present

## 2023-11-08 DIAGNOSIS — R6889 Other general symptoms and signs: Secondary | ICD-10-CM | POA: Diagnosis not present

## 2023-11-08 DIAGNOSIS — M1711 Unilateral primary osteoarthritis, right knee: Secondary | ICD-10-CM | POA: Diagnosis not present

## 2023-11-08 DIAGNOSIS — I5032 Chronic diastolic (congestive) heart failure: Secondary | ICD-10-CM | POA: Diagnosis not present

## 2023-11-08 DIAGNOSIS — G8929 Other chronic pain: Secondary | ICD-10-CM | POA: Diagnosis not present

## 2023-11-08 DIAGNOSIS — E861 Hypovolemia: Secondary | ICD-10-CM | POA: Diagnosis not present

## 2023-11-08 DIAGNOSIS — G4733 Obstructive sleep apnea (adult) (pediatric): Secondary | ICD-10-CM | POA: Diagnosis not present

## 2023-11-08 DIAGNOSIS — M25561 Pain in right knee: Secondary | ICD-10-CM | POA: Diagnosis not present

## 2023-11-08 DIAGNOSIS — I4819 Other persistent atrial fibrillation: Secondary | ICD-10-CM | POA: Diagnosis not present

## 2023-11-08 DIAGNOSIS — I10 Essential (primary) hypertension: Secondary | ICD-10-CM | POA: Diagnosis not present

## 2023-11-08 DIAGNOSIS — Z87891 Personal history of nicotine dependence: Secondary | ICD-10-CM | POA: Diagnosis not present

## 2023-11-08 DIAGNOSIS — E1169 Type 2 diabetes mellitus with other specified complication: Secondary | ICD-10-CM | POA: Diagnosis not present

## 2023-11-08 DIAGNOSIS — Z7401 Bed confinement status: Secondary | ICD-10-CM | POA: Diagnosis not present

## 2023-11-08 DIAGNOSIS — M25572 Pain in left ankle and joints of left foot: Secondary | ICD-10-CM | POA: Diagnosis not present

## 2023-11-08 DIAGNOSIS — M25461 Effusion, right knee: Secondary | ICD-10-CM | POA: Diagnosis not present

## 2023-11-08 DIAGNOSIS — Z743 Need for continuous supervision: Secondary | ICD-10-CM | POA: Diagnosis not present

## 2023-11-08 LAB — CBC WITH DIFFERENTIAL/PLATELET
Abs Immature Granulocytes: 0.04 10*3/uL (ref 0.00–0.07)
Basophils Absolute: 0 10*3/uL (ref 0.0–0.1)
Basophils Relative: 0 %
Eosinophils Absolute: 0.1 10*3/uL (ref 0.0–0.5)
Eosinophils Relative: 1 %
HCT: 47.3 % — ABNORMAL HIGH (ref 36.0–46.0)
Hemoglobin: 15.5 g/dL — ABNORMAL HIGH (ref 12.0–15.0)
Immature Granulocytes: 0 %
Lymphocytes Relative: 28 %
Lymphs Abs: 2.7 10*3/uL (ref 0.7–4.0)
MCH: 31.1 pg (ref 26.0–34.0)
MCHC: 32.8 g/dL (ref 30.0–36.0)
MCV: 95 fL (ref 80.0–100.0)
Monocytes Absolute: 1.2 10*3/uL — ABNORMAL HIGH (ref 0.1–1.0)
Monocytes Relative: 13 %
Neutro Abs: 5.7 10*3/uL (ref 1.7–7.7)
Neutrophils Relative %: 58 %
Platelets: 171 10*3/uL (ref 150–400)
RBC: 4.98 MIL/uL (ref 3.87–5.11)
RDW: 16 % — ABNORMAL HIGH (ref 11.5–15.5)
WBC: 9.7 10*3/uL (ref 4.0–10.5)
nRBC: 0 % (ref 0.0–0.2)

## 2023-11-08 LAB — BASIC METABOLIC PANEL WITH GFR
Anion gap: 12 (ref 5–15)
BUN: 21 mg/dL (ref 8–23)
CO2: 28 mmol/L (ref 22–32)
Calcium: 9.1 mg/dL (ref 8.9–10.3)
Chloride: 97 mmol/L — ABNORMAL LOW (ref 98–111)
Creatinine, Ser: 1.28 mg/dL — ABNORMAL HIGH (ref 0.44–1.00)
GFR, Estimated: 42 mL/min — ABNORMAL LOW (ref >60)
Glucose, Bld: 85 mg/dL (ref 70–99)
Potassium: 4.4 mmol/L (ref 3.5–5.1)
Sodium: 137 mmol/L (ref 135–145)

## 2023-11-08 MED ORDER — OXYCODONE HCL 5 MG PO TABS
5.0000 mg | ORAL_TABLET | Freq: Once | ORAL | Status: AC
  Administered 2023-11-08: 5 mg via ORAL
  Filled 2023-11-08: qty 1

## 2023-11-08 MED ORDER — ACETAMINOPHEN 500 MG PO TABS
1000.0000 mg | ORAL_TABLET | Freq: Once | ORAL | Status: AC
  Administered 2023-11-08: 1000 mg via ORAL
  Filled 2023-11-08: qty 2

## 2023-11-08 NOTE — ED Triage Notes (Signed)
 Patient fell going up the stairs and hit her right knee. She also has cellulitis of the left ankle causing her discomfort. She denies hitting her head. She is on Eliquis .

## 2023-11-08 NOTE — ED Provider Notes (Signed)
 [place] EMERGENCY DEPARTMENT AT [facility] Provider Note   CSN: [id number] Arrival date & time: 11/08/23  1515     History Chief Complaint  Patient presents with   Fall    HPI Heidi Stark is a 83 y.o. female presenting for chief complaint of right knee pain. GLF going up the stairs. States she missed the step on her entrance.  Got Ceftriaxone  for left ankle cellulitis   Patient's recorded medical, surgical, social, medication list and allergies were reviewed in the Snapshot window as part of the initial history.   Review of Systems   Review of Systems  Constitutional:  Negative for chills and fever.  HENT:  Negative for ear pain and sore throat.   Eyes:  Negative for pain and visual disturbance.  Respiratory:  Negative for cough and shortness of breath.   Cardiovascular:  Negative for chest pain and palpitations.  Gastrointestinal:  Negative for abdominal pain and vomiting.  Genitourinary:  Negative for dysuria and hematuria.  Musculoskeletal:  Negative for arthralgias and back pain.  Skin:  Negative for color change and rash.  Neurological:  Negative for seizures and syncope.  All other systems reviewed and are negative.   Physical Exam Updated Vital Signs BP 110/87   Pulse (!) 55   Temp 97.7 F (36.5 C) (Oral)   Resp 14   SpO2 93%  Physical Exam Vitals and nursing note reviewed.  Constitutional:      General: She is not in acute distress.    Appearance: She is well-developed.  HENT:     Head: Normocephalic and atraumatic.  Eyes:     Conjunctiva/sclera: Conjunctivae normal.  Cardiovascular:     Rate and Rhythm: Normal rate and regular rhythm.     Heart sounds: No murmur heard. Pulmonary:     Effort: Pulmonary effort is normal. No respiratory distress.     Breath sounds: Normal breath sounds.  Abdominal:     General: There is no distension.     Palpations: Abdomen is soft.     Tenderness: There is no abdominal tenderness. There is no right  CVA tenderness or left CVA tenderness.  Musculoskeletal:        General: Signs of injury present. No swelling or tenderness. Normal range of motion.     Cervical back: Neck supple.  Skin:    General: Skin is warm and dry.  Neurological:     General: No focal deficit present.     Mental Status: She is alert and oriented to person, place, and time. Mental status is at baseline.     Cranial Nerves: No cranial nerve deficit.      ED Course/ Medical Decision Making/ A&P    Procedures Procedures   Medications Ordered in ED Medications  acetaminophen  (TYLENOL ) tablet 1,000 mg (1,000 mg Oral Given 11/08/23 1714)  oxyCODONE  (Oxy IR/ROXICODONE ) immediate release tablet 5 mg (5 mg Oral Given 11/08/23 1714)    Medical Decision Making:   TILIA FASO is a 83 y.o. female who presented to the ED today with chief complaint ground-level fall detailed above.    Additional history discussed with patient's family/caregivers.  Patient placed on continuous vitals and telemetry monitoring while in ED which was reviewed periodically.  Complete initial physical exam performed, notably the patient  was hemodynamically stable no acute distress.    Reviewed and confirmed nursing documentation for past medical history, family history, social history.    Initial Assessment:   Patient's history of present illness  and physical exam findings consistent with developing cellulitis of the left lower extremity.  She was started on antibiotics earlier today and her outpatient appointment. Has a history of sepsis from cellulitis and states that she felt a little weaker than normal today which is what caused her fall.  Requesting further lab work up to ensure no signs of systemic infection. CBC/BMP performed without acute pathology detected X-ray right knee x-ray left ankle (locations of tenderness) without evidence of orthopedic injury.  She is ambulatory tolerating p.o. intake at time of discharge stable for  outpatient care management follow-up with orthopedic.  Disposition:  I have considered need for hospitalization, however, considering all of the above, I believe this patient is stable for discharge at this time.  Patient/family educated about specific return precautions for given chief complaint and symptoms.  Patient/family educated about follow-up with PCP and ortho.     Patient/family expressed understanding of return precautions and need for follow-up. Patient spoken to regarding all imaging and laboratory results and appropriate follow up for these results. All education provided in verbal form with additional information in written form. Time was allowed for answering of patient questions. Patient discharged.    Emergency Department Medication Summary:   Medications  acetaminophen  (TYLENOL ) tablet 1,000 mg (1,000 mg Oral Given 11/08/23 1714)  oxyCODONE  (Oxy IR/ROXICODONE ) immediate release tablet 5 mg (5 mg Oral Given 11/08/23 1714)         Clinical Impression:  1. Fall, initial encounter      Discharge   Final Clinical Impression(s) / ED Diagnoses Final diagnoses:  Fall, initial encounter    Rx / DC Orders ED Discharge Orders     None         Onetha Bile, MD 11/08/23 [phone]

## 2023-11-08 NOTE — ED Notes (Signed)
 [facility] Called [phone]. [address]. [place], [place] 7 patients ahead.

## 2023-11-09 ENCOUNTER — Emergency Department (HOSPITAL_COMMUNITY)

## 2023-11-09 ENCOUNTER — Inpatient Hospital Stay (HOSPITAL_COMMUNITY)
Admission: EM | Admit: 2023-11-09 | Discharge: 2023-11-14 | DRG: 565 | Disposition: A | Discharge status: Home / Self Care | Attending: Internal Medicine | Admitting: Internal Medicine

## 2023-11-09 ENCOUNTER — Encounter (HOSPITAL_COMMUNITY): Payer: Self-pay

## 2023-11-09 DIAGNOSIS — S90522A Blister (nonthermal), left ankle, initial encounter: Secondary | ICD-10-CM | POA: Diagnosis present

## 2023-11-09 DIAGNOSIS — Z906 Acquired absence of other parts of urinary tract: Secondary | ICD-10-CM | POA: Diagnosis not present

## 2023-11-09 DIAGNOSIS — Z832 Family history of diseases of the blood and blood-forming organs and certain disorders involving the immune mechanism: Secondary | ICD-10-CM

## 2023-11-09 DIAGNOSIS — Z8249 Family history of ischemic heart disease and other diseases of the circulatory system: Secondary | ICD-10-CM

## 2023-11-09 DIAGNOSIS — Z87891 Personal history of nicotine dependence: Secondary | ICD-10-CM | POA: Diagnosis not present

## 2023-11-09 DIAGNOSIS — W109XXA Fall (on) (from) unspecified stairs and steps, initial encounter: Secondary | ICD-10-CM | POA: Diagnosis present

## 2023-11-09 DIAGNOSIS — I4819 Other persistent atrial fibrillation: Secondary | ICD-10-CM | POA: Diagnosis present

## 2023-11-09 DIAGNOSIS — G8929 Other chronic pain: Secondary | ICD-10-CM | POA: Diagnosis present

## 2023-11-09 DIAGNOSIS — E039 Hypothyroidism, unspecified: Secondary | ICD-10-CM | POA: Diagnosis present

## 2023-11-09 DIAGNOSIS — E1169 Type 2 diabetes mellitus with other specified complication: Secondary | ICD-10-CM | POA: Diagnosis present

## 2023-11-09 DIAGNOSIS — W1830XA Fall on same level, unspecified, initial encounter: Secondary | ICD-10-CM | POA: Diagnosis present

## 2023-11-09 DIAGNOSIS — N1832 Chronic kidney disease, stage 3b: Secondary | ICD-10-CM | POA: Diagnosis present

## 2023-11-09 DIAGNOSIS — E861 Hypovolemia: Secondary | ICD-10-CM | POA: Diagnosis present

## 2023-11-09 DIAGNOSIS — M25461 Effusion, right knee: Principal | ICD-10-CM | POA: Diagnosis present

## 2023-11-09 DIAGNOSIS — J449 Chronic obstructive pulmonary disease, unspecified: Secondary | ICD-10-CM | POA: Diagnosis present

## 2023-11-09 DIAGNOSIS — Z79890 Hormone replacement therapy (postmenopausal): Secondary | ICD-10-CM | POA: Diagnosis not present

## 2023-11-09 DIAGNOSIS — L03116 Cellulitis of left lower limb: Secondary | ICD-10-CM | POA: Diagnosis not present

## 2023-11-09 DIAGNOSIS — M25561 Pain in right knee: Secondary | ICD-10-CM | POA: Diagnosis not present

## 2023-11-09 DIAGNOSIS — I472 Ventricular tachycardia, unspecified: Secondary | ICD-10-CM | POA: Diagnosis present

## 2023-11-09 DIAGNOSIS — I130 Hypertensive heart and chronic kidney disease with heart failure and stage 1 through stage 4 chronic kidney disease, or unspecified chronic kidney disease: Secondary | ICD-10-CM | POA: Diagnosis present

## 2023-11-09 DIAGNOSIS — E1122 Type 2 diabetes mellitus with diabetic chronic kidney disease: Secondary | ICD-10-CM | POA: Diagnosis present

## 2023-11-09 DIAGNOSIS — Y92019 Unspecified place in single-family (private) house as the place of occurrence of the external cause: Secondary | ICD-10-CM

## 2023-11-09 DIAGNOSIS — S8391XA Sprain of unspecified site of right knee, initial encounter: Secondary | ICD-10-CM | POA: Diagnosis present

## 2023-11-09 DIAGNOSIS — Z883 Allergy status to other anti-infective agents status: Secondary | ICD-10-CM

## 2023-11-09 DIAGNOSIS — Z7901 Long term (current) use of anticoagulants: Secondary | ICD-10-CM | POA: Diagnosis not present

## 2023-11-09 DIAGNOSIS — I5032 Chronic diastolic (congestive) heart failure: Secondary | ICD-10-CM | POA: Diagnosis present

## 2023-11-09 DIAGNOSIS — I10 Essential (primary) hypertension: Secondary | ICD-10-CM | POA: Diagnosis present

## 2023-11-09 DIAGNOSIS — Z808 Family history of malignant neoplasm of other organs or systems: Secondary | ICD-10-CM

## 2023-11-09 DIAGNOSIS — M6281 Muscle weakness (generalized): Secondary | ICD-10-CM | POA: Diagnosis not present

## 2023-11-09 DIAGNOSIS — Z882 Allergy status to sulfonamides status: Secondary | ICD-10-CM

## 2023-11-09 DIAGNOSIS — G4733 Obstructive sleep apnea (adult) (pediatric): Secondary | ICD-10-CM | POA: Diagnosis present

## 2023-11-09 DIAGNOSIS — M549 Dorsalgia, unspecified: Secondary | ICD-10-CM | POA: Diagnosis present

## 2023-11-09 DIAGNOSIS — Z833 Family history of diabetes mellitus: Secondary | ICD-10-CM

## 2023-11-09 DIAGNOSIS — E785 Hyperlipidemia, unspecified: Secondary | ICD-10-CM | POA: Diagnosis present

## 2023-11-09 DIAGNOSIS — Z95810 Presence of automatic (implantable) cardiac defibrillator: Secondary | ICD-10-CM

## 2023-11-09 DIAGNOSIS — Z881 Allergy status to other antibiotic agents status: Secondary | ICD-10-CM | POA: Diagnosis not present

## 2023-11-09 DIAGNOSIS — M1711 Unilateral primary osteoarthritis, right knee: Secondary | ICD-10-CM | POA: Diagnosis not present

## 2023-11-09 DIAGNOSIS — I959 Hypotension, unspecified: Secondary | ICD-10-CM | POA: Diagnosis present

## 2023-11-09 DIAGNOSIS — Z79899 Other long term (current) drug therapy: Secondary | ICD-10-CM

## 2023-11-09 DIAGNOSIS — N183 Chronic kidney disease, stage 3 unspecified: Secondary | ICD-10-CM | POA: Diagnosis present

## 2023-11-09 DIAGNOSIS — R2689 Other abnormalities of gait and mobility: Secondary | ICD-10-CM | POA: Diagnosis not present

## 2023-11-09 DIAGNOSIS — M25469 Effusion, unspecified knee: Secondary | ICD-10-CM

## 2023-11-09 DIAGNOSIS — Z743 Need for continuous supervision: Secondary | ICD-10-CM | POA: Diagnosis not present

## 2023-11-09 DIAGNOSIS — M810 Age-related osteoporosis without current pathological fracture: Secondary | ICD-10-CM | POA: Diagnosis not present

## 2023-11-09 DIAGNOSIS — R531 Weakness: Secondary | ICD-10-CM | POA: Diagnosis not present

## 2023-11-09 DIAGNOSIS — Z87442 Personal history of urinary calculi: Secondary | ICD-10-CM

## 2023-11-09 DIAGNOSIS — Z888 Allergy status to other drugs, medicaments and biological substances status: Secondary | ICD-10-CM

## 2023-11-09 DIAGNOSIS — Z7401 Bed confinement status: Secondary | ICD-10-CM | POA: Diagnosis not present

## 2023-11-09 DIAGNOSIS — I480 Paroxysmal atrial fibrillation: Secondary | ICD-10-CM | POA: Diagnosis present

## 2023-11-09 LAB — CBC WITH DIFFERENTIAL/PLATELET
Abs Immature Granulocytes: 0.06 10*3/uL (ref 0.00–0.07)
Basophils Absolute: 0 10*3/uL (ref 0.0–0.1)
Basophils Relative: 0 %
Eosinophils Absolute: 0 10*3/uL (ref 0.0–0.5)
Eosinophils Relative: 0 %
HCT: 52 % — ABNORMAL HIGH (ref 36.0–46.0)
Hemoglobin: 15.9 g/dL — ABNORMAL HIGH (ref 12.0–15.0)
Immature Granulocytes: 1 %
Lymphocytes Relative: 15 %
Lymphs Abs: 2 10*3/uL (ref 0.7–4.0)
MCH: 29.9 pg (ref 26.0–34.0)
MCHC: 30.6 g/dL (ref 30.0–36.0)
MCV: 97.9 fL (ref 80.0–100.0)
Monocytes Absolute: 2 10*3/uL — ABNORMAL HIGH (ref 0.1–1.0)
Monocytes Relative: 16 %
Neutro Abs: 8.8 10*3/uL — ABNORMAL HIGH (ref 1.7–7.7)
Neutrophils Relative %: 68 %
Platelets: 190 10*3/uL (ref 150–400)
RBC: 5.31 MIL/uL — ABNORMAL HIGH (ref 3.87–5.11)
RDW: 16.1 % — ABNORMAL HIGH (ref 11.5–15.5)
WBC: 12.8 10*3/uL — ABNORMAL HIGH (ref 4.0–10.5)
nRBC: 0 % (ref 0.0–0.2)

## 2023-11-09 LAB — COMPREHENSIVE METABOLIC PANEL WITH GFR
ALT: 18 U/L (ref 0–44)
AST: 29 U/L (ref 15–41)
Albumin: 3.4 g/dL — ABNORMAL LOW (ref 3.5–5.0)
Alkaline Phosphatase: 48 U/L (ref 38–126)
Anion gap: 11 (ref 5–15)
BUN: 18 mg/dL (ref 8–23)
CO2: 28 mmol/L (ref 22–32)
Calcium: 8.8 mg/dL — ABNORMAL LOW (ref 8.9–10.3)
Chloride: 98 mmol/L (ref 98–111)
Creatinine, Ser: 1.07 mg/dL — ABNORMAL HIGH (ref 0.44–1.00)
GFR, Estimated: 52 mL/min — ABNORMAL LOW (ref >60)
Glucose, Bld: 106 mg/dL — ABNORMAL HIGH (ref 70–99)
Potassium: 4.3 mmol/L (ref 3.5–5.1)
Sodium: 137 mmol/L (ref 135–145)
Total Bilirubin: 0.6 mg/dL (ref 0.0–1.2)
Total Protein: 7.1 g/dL (ref 6.5–8.1)

## 2023-11-09 LAB — GLUCOSE, CAPILLARY
Glucose-Capillary: 130 mg/dL — ABNORMAL HIGH (ref 70–99)
Glucose-Capillary: 162 mg/dL — ABNORMAL HIGH (ref 70–99)

## 2023-11-09 LAB — CBG MONITORING, ED: Glucose-Capillary: 112 mg/dL — ABNORMAL HIGH (ref 70–99)

## 2023-11-09 MED ORDER — ONDANSETRON HCL 4 MG/2ML IJ SOLN
4.0000 mg | Freq: Four times a day (QID) | INTRAMUSCULAR | Status: DC | PRN
  Administered 2023-11-09 – 2023-11-11 (×2): 4 mg via INTRAVENOUS
  Filled 2023-11-09 (×2): qty 2

## 2023-11-09 MED ORDER — METHOCARBAMOL 1000 MG/10ML IJ SOLN
500.0000 mg | Freq: Four times a day (QID) | INTRAMUSCULAR | Status: DC | PRN
  Administered 2023-11-10 – 2023-11-11 (×2): 500 mg via INTRAVENOUS
  Filled 2023-11-09 (×2): qty 10

## 2023-11-09 MED ORDER — APIXABAN 5 MG PO TABS
5.0000 mg | ORAL_TABLET | Freq: Two times a day (BID) | ORAL | Status: DC
  Administered 2023-11-09 – 2023-11-14 (×10): 5 mg via ORAL
  Filled 2023-11-09 (×10): qty 1

## 2023-11-09 MED ORDER — INSULIN ASPART 100 UNIT/ML IJ SOLN
0.0000 [IU] | Freq: Every day | INTRAMUSCULAR | Status: DC
  Filled 2023-11-09: qty 0.05

## 2023-11-09 MED ORDER — GUAIFENESIN ER 600 MG PO TB12
600.0000 mg | ORAL_TABLET | Freq: Two times a day (BID) | ORAL | Status: DC | PRN
  Administered 2023-11-09: 600 mg via ORAL
  Filled 2023-11-09: qty 1

## 2023-11-09 MED ORDER — FENOFIBRATE 54 MG PO TABS
54.0000 mg | ORAL_TABLET | Freq: Every day | ORAL | Status: DC
  Administered 2023-11-10 – 2023-11-14 (×5): 54 mg via ORAL
  Filled 2023-11-09 (×5): qty 1

## 2023-11-09 MED ORDER — ORAL CARE MOUTH RINSE: 15.0000 mL | OROMUCOSAL | Status: DC | PRN

## 2023-11-09 MED ORDER — IPRATROPIUM-ALBUTEROL 0.5-2.5 (3) MG/3ML IN SOLN: 3.0000 mL | RESPIRATORY_TRACT | Status: DC | PRN

## 2023-11-09 MED ORDER — METOPROLOL TARTRATE 5 MG/5ML IV SOLN: 10.0000 mg | INTRAVENOUS | Status: DC | PRN

## 2023-11-09 MED ORDER — OXYCODONE HCL 5 MG PO TABS
5.0000 mg | ORAL_TABLET | ORAL | Status: DC | PRN
  Administered 2023-11-09 – 2023-11-14 (×7): 5 mg via ORAL
  Filled 2023-11-09 (×7): qty 1

## 2023-11-09 MED ORDER — SERTRALINE HCL 25 MG PO TABS
25.0000 mg | ORAL_TABLET | Freq: Every day | ORAL | Status: DC
  Administered 2023-11-10 – 2023-11-14 (×5): 25 mg via ORAL
  Filled 2023-11-09 (×5): qty 1

## 2023-11-09 MED ORDER — SODIUM CHLORIDE 0.9 % IV SOLN
1.0000 g | Freq: Once | INTRAVENOUS | Status: AC
  Administered 2023-11-09: 1 g via INTRAVENOUS
  Filled 2023-11-09: qty 10

## 2023-11-09 MED ORDER — LORAZEPAM 0.5 MG PO TABS: 0.5000 mg | ORAL_TABLET | Freq: Every evening | ORAL | Status: DC | PRN

## 2023-11-09 MED ORDER — MEXILETINE HCL 150 MG PO CAPS
300.0000 mg | ORAL_CAPSULE | Freq: Two times a day (BID) | ORAL | Status: DC
  Administered 2023-11-09 – 2023-11-14 (×10): 300 mg via ORAL
  Filled 2023-11-09 (×11): qty 2

## 2023-11-09 MED ORDER — SODIUM CHLORIDE 0.9 % IV BOLUS
500.0000 mL | Freq: Once | INTRAVENOUS | Status: AC
  Administered 2023-11-09: 500 mL via INTRAVENOUS

## 2023-11-09 MED ORDER — BISACODYL 5 MG PO TBEC
5.0000 mg | DELAYED_RELEASE_TABLET | Freq: Every day | ORAL | Status: DC | PRN
  Administered 2023-11-09 – 2023-11-13 (×2): 5 mg via ORAL
  Filled 2023-11-09 (×2): qty 1

## 2023-11-09 MED ORDER — ACETAMINOPHEN 500 MG PO TABS
1000.0000 mg | ORAL_TABLET | Freq: Once | ORAL | Status: AC
  Administered 2023-11-09: 1000 mg via ORAL
  Filled 2023-11-09: qty 2

## 2023-11-09 MED ORDER — HYDRALAZINE HCL 20 MG/ML IJ SOLN: 10.0000 mg | INTRAMUSCULAR | Status: DC | PRN

## 2023-11-09 MED ORDER — GABAPENTIN 300 MG PO CAPS
300.0000 mg | ORAL_CAPSULE | Freq: Every day | ORAL | Status: DC | PRN
  Administered 2023-11-09 – 2023-11-13 (×4): 300 mg via ORAL
  Filled 2023-11-09 (×4): qty 1

## 2023-11-09 MED ORDER — SODIUM CHLORIDE 0.9 % IV SOLN: 1.0000 g | INTRAVENOUS | Status: DC

## 2023-11-09 MED ORDER — ACETAMINOPHEN 325 MG PO TABS
650.0000 mg | ORAL_TABLET | Freq: Four times a day (QID) | ORAL | Status: DC | PRN
  Administered 2023-11-10 – 2023-11-14 (×5): 650 mg via ORAL
  Filled 2023-11-09 (×5): qty 2

## 2023-11-09 MED ORDER — HEPARIN SODIUM (PORCINE) 5000 UNIT/ML IJ SOLN: 5000.0000 [IU] | Freq: Three times a day (TID) | INTRAMUSCULAR | Status: DC

## 2023-11-09 MED ORDER — AMIODARONE HCL 200 MG PO TABS
200.0000 mg | ORAL_TABLET | Freq: Every day | ORAL | Status: DC
  Administered 2023-11-10 – 2023-11-14 (×5): 200 mg via ORAL
  Filled 2023-11-09 (×5): qty 1

## 2023-11-09 MED ORDER — TRAZODONE HCL 50 MG PO TABS: 50.0000 mg | ORAL_TABLET | Freq: Every evening | ORAL | Status: DC | PRN

## 2023-11-09 MED ORDER — ICOSAPENT ETHYL 1 G PO CAPS
2.0000 g | ORAL_CAPSULE | Freq: Two times a day (BID) | ORAL | Status: DC
  Administered 2023-11-09 – 2023-11-14 (×10): 2 g via ORAL
  Filled 2023-11-09 (×11): qty 2

## 2023-11-09 MED ORDER — DOCUSATE SODIUM 100 MG PO CAPS
100.0000 mg | ORAL_CAPSULE | Freq: Two times a day (BID) | ORAL | Status: DC
  Administered 2023-11-09 – 2023-11-13 (×9): 100 mg via ORAL
  Filled 2023-11-09 (×10): qty 1

## 2023-11-09 MED ORDER — NICOTINE 21 MG/24HR TD PT24: 21.0000 mg | MEDICATED_PATCH | Freq: Every day | TRANSDERMAL | Status: DC | PRN

## 2023-11-09 MED ORDER — INSULIN LISPRO PROT & LISPRO (75-25 MIX) 100 UNIT/ML KWIKPEN: 30.0000 [IU] | PEN_INJECTOR | Freq: Two times a day (BID) | SUBCUTANEOUS | Status: DC

## 2023-11-09 MED ORDER — ROPINIROLE HCL 0.25 MG PO TABS: 0.2500 mg | ORAL_TABLET | Freq: Every day | ORAL | Status: DC

## 2023-11-09 MED ORDER — ONDANSETRON HCL 4 MG/2ML IJ SOLN
4.0000 mg | Freq: Once | INTRAMUSCULAR | Status: AC
  Administered 2023-11-09: 4 mg via INTRAVENOUS
  Filled 2023-11-09: qty 2

## 2023-11-09 MED ORDER — LEVOTHYROXINE SODIUM 25 MCG PO TABS
25.0000 ug | ORAL_TABLET | Freq: Every day | ORAL | Status: DC
  Administered 2023-11-10 – 2023-11-14 (×5): 25 ug via ORAL
  Filled 2023-11-09 (×5): qty 1

## 2023-11-09 MED ORDER — OXYCODONE HCL 5 MG PO TABS
5.0000 mg | ORAL_TABLET | Freq: Once | ORAL | Status: AC
  Administered 2023-11-09: 5 mg via ORAL
  Filled 2023-11-09: qty 1

## 2023-11-09 MED ORDER — FLEET ENEMA RE ENEM: 1.0000 | ENEMA | Freq: Once | RECTAL | Status: DC | PRN

## 2023-11-09 MED ORDER — METOPROLOL TARTRATE 5 MG/5ML IV SOLN: 5.0000 mg | INTRAVENOUS | Status: DC | PRN

## 2023-11-09 MED ORDER — HYDROMORPHONE HCL 1 MG/ML IJ SOLN
0.5000 mg | INTRAMUSCULAR | Status: DC | PRN
  Administered 2023-11-09 – 2023-11-10 (×4): 1 mg via INTRAVENOUS
  Filled 2023-11-09 (×5): qty 1

## 2023-11-09 MED ORDER — INSULIN ASPART 100 UNIT/ML IJ SOLN
0.0000 [IU] | Freq: Three times a day (TID) | INTRAMUSCULAR | Status: DC
  Administered 2023-11-09 – 2023-11-10 (×3): 2 [IU] via SUBCUTANEOUS
  Administered 2023-11-11: 3 [IU] via SUBCUTANEOUS
  Administered 2023-11-11: 2 [IU] via SUBCUTANEOUS
  Administered 2023-11-11 – 2023-11-12 (×2): 3 [IU] via SUBCUTANEOUS
  Administered 2023-11-12: 2 [IU] via SUBCUTANEOUS
  Administered 2023-11-12 – 2023-11-13 (×2): 3 [IU] via SUBCUTANEOUS
  Administered 2023-11-13: 5 [IU] via SUBCUTANEOUS
  Administered 2023-11-14: 2 [IU] via SUBCUTANEOUS
  Filled 2023-11-09: qty 0.15

## 2023-11-09 MED ORDER — ACETAMINOPHEN 650 MG RE SUPP: 650.0000 mg | Freq: Four times a day (QID) | RECTAL | Status: DC | PRN

## 2023-11-09 MED ORDER — SENNOSIDES-DOCUSATE SODIUM 8.6-50 MG PO TABS
1.0000 | ORAL_TABLET | Freq: Every evening | ORAL | Status: DC | PRN
  Administered 2023-11-13: 1 via ORAL
  Filled 2023-11-09: qty 1

## 2023-11-09 MED ORDER — DAPAGLIFLOZIN PROPANEDIOL 10 MG PO TABS
10.0000 mg | ORAL_TABLET | Freq: Every day | ORAL | Status: DC
  Administered 2023-11-10 – 2023-11-14 (×5): 10 mg via ORAL
  Filled 2023-11-09 (×5): qty 1

## 2023-11-09 NOTE — ED Provider Notes (Signed)
 [place] EMERGENCY DEPARTMENT AT [facility] Provider Note   CSN: [id number] Arrival date & time: 11/09/23  1132     History  Chief Complaint  Patient presents with   Heidi Stark is a 83 y.o. female with HTN, HLD, OSA, COPD, CHF, T2DM, hypothyroidism, PAD, Afib, h/o VT, CHF, CKD who presents with fall, R leg pain. Presented to ED yesterday for fall as well. Was initiated on abx for LLE cellulitis today and felt weaker than normal, no signs of sepsis on labs yesterday. XRs of R knee/left ankle without e/o injury. She has had severe pain in R knee today and normally walks with a walker but states she hasn't been ambulatory since the fall yesterday. Reports significant swelling in the R knee with 10/10 pain. No new falls/trauma. No worsening of LLE cellulitis, received rocephin  yesterday, states she was going to go to get another rocephin  injection today but came to ED instead. Has had some nausea today. No f/c, no CP/SOB. Endorses chronic back pain. Hasn't taken anything for pain yet today.   Past Medical History:  Diagnosis Date   Arthritis    "hands, wrists" (11/07/2016)   CHF (congestive heart failure) (HCC)    Chronic lower back pain    Constipation    DVT (deep venous thrombosis) (HCC)    History of blood transfusion 1990   "related to OR"   History of kidney stones    Hyperlipidemia    Hypertension    Joint pain    Lower extremity edema    OSA on CPAP    Persistent atrial fibrillation (HCC)    Pneumonia    "several times" (11/07/2016)   Type II diabetes mellitus (HCC)        Home Medications Prior to Admission medications   Medication Sig Start Date End Date Taking? Authorizing Provider  acetaminophen  (TYLENOL ) 500 MG tablet Take 500 mg by mouth every 6 (six) hours as needed for moderate pain (pain score 4-6) or headache.    [provider]  amiodarone  (PACERONE ) 200 MG tablet TAKE 1 TABLET BY MOUTH EVERY DAY 08/23/23   McLean, Dalton S,  MD  BD PEN NEEDLE NANO 2ND GEN 32G X 4 MM MISC See admin instructions. 09/24/21   [provider]  dapagliflozin  propanediol (FARXIGA ) 10 MG TABS tablet TAKE 1 TABLET BY MOUTH EVERY DAY BEFORE BREAKFAST 05/31/22   Crecencio Dodge, Yvonne R, DO  ELIQUIS  5 MG TABS tablet TAKE 1 TABLET BY MOUTH TWICE A DAY 06/12/23   Darlis Eisenmenger, MD  eplerenone  (INSPRA ) 25 MG tablet TAKE 1 TABLET BY MOUTH EVERY DAY 02/21/23   McLean, Dalton S, MD  fenofibrate  (TRICOR ) 145 MG tablet TAKE 1 TABLET BY MOUTH EVERY DAY 01/22/23   McLean, Dalton S, MD  furosemide  (LASIX ) 40 MG tablet TAKE 2 TABLETS BY MOUTH 2 TIMES DAILY. 10/17/23   Darlis Eisenmenger, MD  gabapentin  (NEURONTIN ) 300 MG capsule Take 300 mg by mouth daily as needed (pain). 07/31/22   [provider]  HUMALOG  MIX 75/25 KWIKPEN (75-25) 100 UNIT/ML KwikPen Take 40 units with morning meal and 30 units with evening meal Patient taking differently: Inject 30 Units into the skin 2 (two) times daily. 05/31/22   Lowne Chase, Yvonne R, DO  levothyroxine  (SYNTHROID ) 25 MCG tablet 25 mcg 3 (three) times a week. 04/24/23   [provider]  LORazepam  (ATIVAN ) 0.5 MG tablet Take 1 tablet (0.5 mg total) by mouth at bedtime  as needed for anxiety (sleep.). 05/19/23   [name], [name], MD  metoprolol  succinate (TOPROL -XL) 25 MG 24 hr tablet TAKE 1 TABLET BY MOUTH EVERYDAY AT BEDTIME 04/23/23   Sheryl Donna, NP  mexiletine (MEXITIL ) 150 MG capsule TAKE 2 CAPSULES (300 MG TOTAL) BY MOUTH 2 (TWO) TIMES DAILY. 05/10/23   Camnitz, Babetta Lesch, MD  Multiple Vitamin (MULTIVITAMIN WITH MINERALS) TABS tablet Take 1 tablet by mouth daily.    [provider]  ondansetron  (ZOFRAN -ODT) 8 MG disintegrating tablet Take 1 tablet (8 mg total) by mouth every 8 (eight) hours as needed for nausea or vomiting. 04/09/23   Everlina Hock, NP  [facility] VERIO test strip 1 each 3 (three) times daily. 10/09/21   [provider]  Pitavastatin  Calcium  4 MG TABS TAKE 1  TABLET BY MOUTH EVERY DAY 10/29/23   Darlis Eisenmenger, MD  potassium chloride  (KLOR-CON  M10) 10 MEQ tablet Take 2 tablets (20 mEq total) by mouth 2 (two) times daily. NEEDS FOLLOW UP APPOINTMENT FOR MORE REFILLS 08/30/23   Darlis Eisenmenger, MD  rOPINIRole  (REQUIP ) 0.25 MG tablet Take 0.25 mg by mouth as needed.    [provider]  sertraline  (ZOLOFT ) 25 MG tablet TAKE 1 TABLET (25 MG TOTAL) BY MOUTH DAILY. 10/15/23   Roel Clarity R, DO  VASCEPA  1 g capsule TAKE 2 CAPSULES BY MOUTH TWICE A DAY 10/10/23   Darlis Eisenmenger, MD      Allergies    Neomycin-bacitracin zn-polymyx, Sulfonamide derivatives, Sulfa antibiotics, Ciprofloxacin , and Spironolactone     Review of Systems   Review of Systems A 10 point review of systems was performed and is negative unless otherwise reported in HPI.  Physical Exam Updated Vital Signs BP (!) 107/58 (BP Location: Right Arm)   Pulse (!) 54   Temp 98.3 F (36.8 C) (Oral)   Resp 20   Ht 5\' 6"  (1.676 m)   Wt 113.4 kg   SpO2 98%   BMI 40.35 kg/m  Physical Exam General: Elderly obese female, lying in bed.  HEENT: NCAT, PERRLA, Sclera anicteric, MMM, trachea midline.  Cardiology: RRR, no murmurs/rubs/gallops.  Resp: Normal respiratory rate and effort. CTAB, no wheezes, rhonchi, crackles.  Abd: Soft, non-tender, non-distended. No rebound tenderness or guarding.  GU: Deferred. MSK: R knee effusion with significant TTP anterior joint. Rom limited d/t pain. Unable to perform anterior/posterior drawer d/t pain. Compartments of RLE soft. LLE with erythema present on anterior foot/ankle/lower leg with mild TTP. ROM ankle intact, no pain with PROM of L ankle. Compartments LLE soft. NVI bilaterally.  Skin: warm, dry.  Neuro: A&Ox4, CNs II-XII grossly intact. MAEs. Sensation grossly intact.  Psych: Normal mood and affect.   ED Results / Procedures / Treatments   Labs (all labs ordered are listed, but only abnormal results are displayed) Labs Reviewed   CBG MONITORING, ED - Abnormal; Notable for the following components:      Result Value   Glucose-Capillary 112 (*)    All other components within normal limits  CBC WITH DIFFERENTIAL/PLATELET  COMPREHENSIVE METABOLIC PANEL WITH GFR    EKG EKG Interpretation Date/Time:  Friday November 09 2023 12:58:35 EDT Ventricular Rate:  57 PR Interval:  285 QRS Duration:  181 QT Interval:  494 QTC Calculation: 481 R Axis:   26  Text Interpretation: Sinus rhythm Atrial premature complexes Prolonged PR interval Right bundle branch block Confirmed by Annita Kindle [phone]) on 11/09/2023 1:15:31 PM  Radiology DG Ankle 2 Views Left Result  Date: 11/08/2023 CLINICAL DATA:  Left ankle pain after fall. EXAM: LEFT ANKLE - 2 VIEW COMPARISON:  None Available. FINDINGS: There is no evidence of fracture, dislocation, or joint effusion. There is no evidence of arthropathy or other focal bone abnormality. Soft tissues are unremarkable. IMPRESSION: Negative. Electronically Signed   By: Rosalene Colon M.D.   On: 11/08/2023 17:25   DG Knee 2 Views Right Result Date: 11/08/2023 CLINICAL DATA:  Right knee pain after fall. EXAM: RIGHT KNEE - 1-2 VIEW COMPARISON:  None Available. FINDINGS: Small suprapatellar joint effusion is noted. No fracture or dislocation is noted. Severe narrowing and osteophyte formation is seen involving lateral joint space. Mild osteophyte formation is noted medially. IMPRESSION: Small suprapatellar joint effusion. Severe degenerative joint disease is noted laterally. No fracture or dislocation. Electronically Signed   By: Rosalene Colon M.D.   On: 11/08/2023 17:24    Procedures .Critical Care  Performed by: Merdis Stalling, MD Authorized by: Merdis Stalling, MD   Critical care provider statement:    Critical care time (minutes):  30   Critical care was necessary to treat or prevent imminent or life-threatening deterioration of the following conditions:  Shock and dehydration   Critical care  was time spent personally by me on the following activities:  Development of treatment plan with patient or surrogate, discussions with consultants, evaluation of patient's response to treatment, examination of patient, ordering and review of laboratory studies, ordering and review of radiographic studies, ordering and performing treatments and interventions, pulse oximetry, re-evaluation of patient's condition, review of old charts and obtaining history from patient or surrogate   Care discussed with: admitting provider       Medications Ordered in ED Medications  ondansetron  (ZOFRAN ) injection 4 mg (has no administration in time range)  oxyCODONE  (Oxy IR/ROXICODONE ) immediate release tablet 5 mg (has no administration in time range)  acetaminophen  (TYLENOL ) tablet 1,000 mg (has no administration in time range)    ED Course/ Medical Decision Making/ A&P                          Medical Decision Making Amount and/or Complexity of Data Reviewed Labs: ordered. Decision-making details documented in ED Course. Radiology: ordered. Decision-making details documented in ED Course.  Risk OTC drugs. Prescription drug management. Decision regarding hospitalization.    This patient presents to the ED for concern of R knee pain after fall, this involves an extensive number of treatment options, and is a complaint that carries with it a high risk of complications and morbidity.  I considered the following differential and admission for this acute, potentially life threatening condition.   MDM:    Patient unable to bear weight after fall yesterday w/ neg XRs. Will get CT to eval for occult fracture. Traumatic R knee effusion w/ significant pain, ROM limited d/t pain. Low c/f septic arthritis. LLE cellulitis appears the same according to patient, will give another dose of ceftriaxone  as was ordered originally, patient with no f/c, low c/f sepsis.   Clinical Course as of 11/09/23 1550  Fri Nov 09, 2023  1402 CT Knee Right Wo Contrast 1. No acute fracture is identified. 2. Age related osteoporosis and advanced degenerative joint disease. 3. Large knee joint effusion along with loose ossified bodies in the joint which could suggest synovial  steochondromatosis. 4. Chondrocalcinosis is also present and can be seen with CPPD arthropathy. 5. Age related fatty atrophy of the knee  musculature but no obvious muscle tear or intramuscular hematoma.   [HN]  1402 WBC(!): 12.8 +leukocytosis, possibly related to trauma or to cellulitis [HN]  1402 BP(!): 93/39 BP soft with MAPs in 50s. Hgb is elevated, possibly volume contraction, but also w/ leukocytosis and infectious source in LLE. Must consider sepsis as well. No fever or tachycardia. Will give IV ceftriaxone .  [HN]  1439 BP improved w/ fluids. 118/53. Likely poor PO intake/hypovolemic. [HN]    Clinical Course User Index [HN] Merdis Stalling, MD    Labs: I Ordered, and personally interpreted labs.  The pertinent results include:  listed above  Imaging Studies ordered: I ordered imaging studies including CT R knee I independently visualized and interpreted imaging. I agree with the radiologist interpretation  Additional history obtained from chart review, daughter at bedside.    Reevaluation: After the interventions noted above, I reevaluated the patient and found that they have :improved  Social Determinants of Health: Lives independently  Disposition:  Admitted to medicine for R knee pain, nonambulatory, LLE cellulitis   Co morbidities that complicate the patient evaluation  Past Medical History:  Diagnosis Date   Arthritis    "hands, wrists" (11/07/2016)   CHF (congestive heart failure) (HCC)    Chronic lower back pain    Constipation    DVT (deep venous thrombosis) (HCC)    History of blood transfusion 1990   "related to OR"   History of kidney stones    Hyperlipidemia    Hypertension    Joint pain    Lower  extremity edema    OSA on CPAP    Persistent atrial fibrillation (HCC)    Pneumonia    "several times" (11/07/2016)   Type II diabetes mellitus (HCC)      Medicines Meds ordered this encounter  Medications   ondansetron  (ZOFRAN ) injection 4 mg   oxyCODONE  (Oxy IR/ROXICODONE ) immediate release tablet 5 mg    Refill:  0   acetaminophen  (TYLENOL ) tablet 1,000 mg    I have reviewed the patients home medicines and have made adjustments as needed  Problem List / ED Course: Problem List Items Addressed This Visit       Other   Cellulitis of left lower extremity - Primary   Other Visit Diagnoses       Traumatic effusion of knee joint         Hypovolemia                       This note was created using dictation software, which may contain spelling or grammatical errors.    Merdis Stalling, MD 11/09/23 [phone]

## 2023-11-09 NOTE — Plan of Care (Signed)

## 2023-11-09 NOTE — H&P (Addendum)
 History and Physical    Heidi Stark UJW:[id number] DOB: 11-Jan-1941 DOA: 11/09/2023  PCP: Estill Hemming, DO Patient coming from: Home  Chief Complaint: Home  HPI: Heidi Stark is a 83 y.o. female with medical history significant of with past medical history of HTN, DM 2, HLD, P A-fib on Eliquis , splenic and renal infarcts, congestive heart failure with recovered ejection fraction 65%, hypothyroidism, history of VT comes to the hospital with complaints of right knee pain.  Patient came to the hospital yesterday with complaints of left lower extremity cellulitis after scraping her lateral side of her ankle about a week ago.  She received Rocephin  and was advised to follow-up orthopedic outpatient today.  She sustained a fall earlier today on her right knee causing excruciating pain and inability to bear weight on it therefore comes back to the hospital.  In the ER initially she was noted to be hypotensive which she responded to fluids.  Lab work showed mild leukocytosis.  CT of the right knee showed large effusion.  Due to worsening left lower extremity cellulitis and right knee pain and swelling she is being admitted to the hospital.  She does have a right knee immobilizer in place   Review of Systems: As per HPI otherwise 10 point review of systems negative.  Review of Systems Otherwise negative except as per HPI, including: General: Denies fever, chills, night sweats or unintended weight loss. Resp: Denies cough, wheezing, shortness of breath. Cardiac: Denies chest pain, palpitations, orthopnea, paroxysmal nocturnal dyspnea. GI: Denies abdominal pain, nausea, vomiting, diarrhea or constipation GU: Denies dysuria, frequency, hesitancy or incontinence MS: Right knee pain and swelling Neuro: Denies headache, neurologic deficits (focal weakness, numbness, tingling), abnormal gait Psych: Denies anxiety, depression, SI/HI/AVH Skin: Left lower extremity erythema ID: Denies sick  contacts, exotic exposures, travel  Past Medical History:  Diagnosis Date   Arthritis    "hands, wrists" (11/07/2016)   CHF (congestive heart failure) (HCC)    Chronic lower back pain    Constipation    DVT (deep venous thrombosis) (HCC)    History of blood transfusion 1990   "related to OR"   History of kidney stones    Hyperlipidemia    Hypertension    Joint pain    Lower extremity edema    OSA on CPAP    Persistent atrial fibrillation (HCC)    Pneumonia    "several times" (11/07/2016)   Type II diabetes mellitus (HCC)     Past Surgical History:  Procedure Laterality Date   ATRIAL FIBRILLATION ABLATION N/A 11/07/2016   Procedure: Atrial Fibrillation Ablation;  Surgeon: Will Cortland Ding, MD;  Location: [facility] INVASIVE CV LAB;  Service: Cardiovascular;  Laterality: N/A;   BACK SURGERY     CARDIAC CATHETERIZATION     CARDIOVERSION N/A 08/11/2016   Procedure: CARDIOVERSION;  Surgeon: Darlis Eisenmenger, MD;  Location: [facility] ENDOSCOPY;  Service: Cardiovascular;  Laterality: N/A;   CARDIOVERSION N/A 12/03/2017   Procedure: CARDIOVERSION;  Surgeon: Darlis Eisenmenger, MD;  Location: [facility] ENDOSCOPY;  Service: Cardiovascular;  Laterality: N/A;   CARDIOVERSION N/A 05/02/2018   Procedure: CARDIOVERSION;  Surgeon: Hazle Lites, MD;  Location: [facility] ENDOSCOPY;  Service: Cardiovascular;  Laterality: N/A;   CARDIOVERSION N/A 06/23/2020   Procedure: CARDIOVERSION;  Surgeon: Sonny Dust, MD;  Location: [facility] ENDOSCOPY;  Service: Cardiovascular;  Laterality: N/A;   CARDIOVERSION N/A 10/11/2020   Procedure: CARDIOVERSION;  Surgeon: Darlis Eisenmenger, MD;  Location: [facility] ENDOSCOPY;  Service: Cardiovascular;  Laterality: N/A;   CARPAL TUNNEL RELEASE     CATARACT EXTRACTION W/ INTRAOCULAR LENS  IMPLANT, BILATERAL Bilateral    COLON SURGERY  1990   vein graft and colon repair after nicked artery with back surgert   COLONOSCOPY     CORONARY ANGIOGRAPHY N/A 09/08/2020   Procedure: CORONARY ANGIOGRAPHY;   Surgeon: Darlis Eisenmenger, MD;  Location: [facility] INVASIVE CV LAB;  Service: Cardiovascular;  Laterality: N/A;   CYSTECTOMY     between bladder and kidneys   GANGLION CYST EXCISION Left    ICD IMPLANT N/A 09/15/2020   Procedure: ICD IMPLANT;  Surgeon: Lei Pump, MD;  Location: [facility] INVASIVE CV LAB;  Service: Cardiovascular;  Laterality: N/A;   KNEE CARTILAGE SURGERY Right 1960s   LUMBAR DISC SURGERY  1990   REPAIR ILIAC ARTERY  1990   vein graft and colon repair after nicked artery with back surgert   TEE WITHOUT CARDIOVERSION N/A 10/31/2013   Procedure: TRANSESOPHAGEAL ECHOCARDIOGRAM (TEE);  Surgeon: Darlis Eisenmenger, MD;  Location: [facility] ENDOSCOPY;  Service: Cardiovascular;  Laterality: N/A;   TUBAL LIGATION      SOCIAL HISTORY:  reports that she quit smoking about 3 years ago. Her smoking use included cigarettes. She started smoking about 53 years ago. She has a 50 pack-year smoking history. She has never used smokeless tobacco. She reports that she does not drink alcohol and does not use drugs.  Allergies  Allergen Reactions   Neomycin-Bacitracin Zn-Polymyx Rash   Sulfonamide Derivatives Other (See Comments)    Stomach cramps   Sulfa Antibiotics Other (See Comments)   Ciprofloxacin  Hives, Itching and Rash    Pt doesn't remember having any reaction to cipro    Spironolactone  Rash    FAMILY HISTORY: Family History  Problem Relation Age of Onset   Diabetes Mother    Hypertension Mother    Cancer Mother    Obesity Mother    Diabetes Father    Obesity Father    Diabetes Other    Melanoma Other    Factor V Leiden deficiency Daughter      Prior to Admission medications   Medication Sig Start Date End Date Taking? Authorizing Provider  acetaminophen  (TYLENOL ) 500 MG tablet Take 500 mg by mouth every 6 (six) hours as needed for moderate pain (pain score 4-6) or headache.    [provider]  amiodarone  (PACERONE ) 200 MG tablet TAKE 1 TABLET BY MOUTH EVERY DAY 08/23/23    Darlis Eisenmenger, MD  BD PEN NEEDLE NANO 2ND GEN 32G X 4 MM MISC See admin instructions. 09/24/21   [provider]  dapagliflozin  propanediol (FARXIGA ) 10 MG TABS tablet TAKE 1 TABLET BY MOUTH EVERY DAY BEFORE BREAKFAST 05/31/22   Crecencio Dodge, Yvonne R, DO  ELIQUIS  5 MG TABS tablet TAKE 1 TABLET BY MOUTH TWICE A DAY 06/12/23   Darlis Eisenmenger, MD  eplerenone  (INSPRA ) 25 MG tablet TAKE 1 TABLET BY MOUTH EVERY DAY 02/21/23   McLean, Dalton S, MD  fenofibrate  (TRICOR ) 145 MG tablet TAKE 1 TABLET BY MOUTH EVERY DAY 01/22/23   Darlis Eisenmenger, MD  furosemide  (LASIX ) 40 MG tablet TAKE 2 TABLETS BY MOUTH 2 TIMES DAILY. 10/17/23   Darlis Eisenmenger, MD  gabapentin  (NEURONTIN ) 300 MG capsule Take 300 mg by mouth daily as needed (pain). 07/31/22   [provider]  HUMALOG  MIX 75/25 KWIKPEN (75-25) 100 UNIT/ML KwikPen Take 40 units with morning meal and 30 units with evening meal Patient taking differently:  Inject 30 Units into the skin 2 (two) times daily. 05/31/22   Lowne Chase, Yvonne R, DO  levothyroxine  (SYNTHROID ) 25 MCG tablet 25 mcg 3 (three) times a week. 04/24/23   [provider]  LORazepam  (ATIVAN ) 0.5 MG tablet Take 1 tablet (0.5 mg total) by mouth at bedtime as needed for anxiety (sleep.). 05/19/23   [name], [name], MD  metoprolol  succinate (TOPROL -XL) 25 MG 24 hr tablet TAKE 1 TABLET BY MOUTH EVERYDAY AT BEDTIME 04/23/23   Sheryl Donna, NP  mexiletine (MEXITIL ) 150 MG capsule TAKE 2 CAPSULES (300 MG TOTAL) BY MOUTH 2 (TWO) TIMES DAILY. 05/10/23   Camnitz, Babetta Lesch, MD  Multiple Vitamin (MULTIVITAMIN WITH MINERALS) TABS tablet Take 1 tablet by mouth daily.    [provider]  ondansetron  (ZOFRAN -ODT) 8 MG disintegrating tablet Take 1 tablet (8 mg total) by mouth every 8 (eight) hours as needed for nausea or vomiting. 04/09/23   Everlina Hock, NP  [facility] VERIO test strip 1 each 3 (three) times daily. 10/09/21   [provider]  Pitavastatin  Calcium   4 MG TABS TAKE 1 TABLET BY MOUTH EVERY DAY 10/29/23   Darlis Eisenmenger, MD  potassium chloride  (KLOR-CON  M10) 10 MEQ tablet Take 2 tablets (20 mEq total) by mouth 2 (two) times daily. NEEDS FOLLOW UP APPOINTMENT FOR MORE REFILLS 08/30/23   Darlis Eisenmenger, MD  rOPINIRole  (REQUIP ) 0.25 MG tablet Take 0.25 mg by mouth as needed.    [provider]  sertraline  (ZOLOFT ) 25 MG tablet TAKE 1 TABLET (25 MG TOTAL) BY MOUTH DAILY. 10/15/23   Estill Hemming, DO  VASCEPA  1 g capsule TAKE 2 CAPSULES BY MOUTH TWICE A DAY 10/10/23   Darlis Eisenmenger, MD    Physical Exam: Vitals:   11/09/23 1415 11/09/23 1430 11/09/23 1445 11/09/23 1500  BP:  (!) 87/49 (!) 104/45 (!) 115/52  Pulse:  (!) 52 60 63  Resp:  17 16 14   Temp:    97.9 F (36.6 C)  TempSrc:    Oral  SpO2: 97% 97% 98% 100%  Weight:      Height:          Constitutional: NAD, calm, comfortable Eyes: PERRL, lids and conjunctivae normal ENMT: Mucous membranes are moist. Posterior pharynx clear of any exudate or lesions.Normal dentition.  Neck: normal, supple, no masses, no thyromegaly Respiratory: clear to auscultation bilaterally, no wheezing, no crackles. Normal respiratory effort. No accessory muscle use.  Cardiovascular: Regular rate and rhythm, no murmurs / rubs / gallops. No extremity edema. 2+ pedal pulses. No carotid bruits.  Abdomen: no tenderness, no masses palpated. No hepatosplenomegaly. Bowel sounds positive.  Musculoskeletal: Right knee immobilizer in place Skin: Left lower extremity erythema with superficial skin tear on the lateral ankle Neurologic: CN 2-12 grossly intact. Sensation intact, DTR normal. Strength 5/5 in all 4.  Psychiatric: Normal judgment and insight. Alert and oriented x 3. Normal mood.    Body mass index is 40.35 kg/m.      Labs on Admission: I have personally reviewed following labs and imaging studies  CBC: Recent Labs  Lab 11/08/23 1651 11/09/23 1253  WBC 9.7 12.8*  NEUTROABS 5.7  8.8*  HGB 15.5* 15.9*  HCT 47.3* 52.0*  MCV 95.0 97.9  PLT 171 190   Basic Metabolic Panel: Recent Labs  Lab 11/08/23 1651 11/09/23 1253  NA 137 137  K 4.4 4.3  CL 97* 98  CO2 28 28  GLUCOSE 85 106*  BUN  21 18  CREATININE 1.28* 1.07*  CALCIUM  9.1 8.8*   GFR: Estimated Creatinine Clearance: 52.7 mL/min (A) (by C-G formula based on SCr of 1.07 mg/dL (H)). Liver Function Tests: Recent Labs  Lab 11/09/23 1253  AST 29  ALT 18  ALKPHOS 48  BILITOT 0.6  PROT 7.1  ALBUMIN 3.4*   No results for input(s): "LIPASE", "AMYLASE" in the last 168 hours. No results for input(s): "AMMONIA" in the last 168 hours. Coagulation Profile: No results for input(s): "INR", "PROTIME" in the last 168 hours. Cardiac Enzymes: No results for input(s): "CKTOTAL", "CKMB", "CKMBINDEX", "TROPONINI" in the last 168 hours. BNP (last 3 results) No results for input(s): "PROBNP" in the last 8760 hours. HbA1C: No results for input(s): "HGBA1C" in the last 72 hours. CBG: Recent Labs  Lab 11/09/23 1240  GLUCAP 112*   Lipid Profile: No results for input(s): "CHOL", "HDL", "LDLCALC", "TRIG", "CHOLHDL", "LDLDIRECT" in the last 72 hours. Thyroid  Function Tests: No results for input(s): "TSH", "T4TOTAL", "FREET4", "T3FREE", "THYROIDAB" in the last 72 hours. Anemia Panel: No results for input(s): "VITAMINB12", "FOLATE", "FERRITIN", "TIBC", "IRON", "RETICCTPCT" in the last 72 hours. Urine analysis:    Component Value Date/Time   COLORURINE YELLOW 05/12/2023 2016   APPEARANCEUR CLEAR 05/12/2023 2016   LABSPEC 1.011 05/12/2023 2016   PHURINE 6.0 05/12/2023 2016   GLUCOSEU >=500 (A) 05/12/2023 2016   GLUCOSEU Negative 11/30/2011 0804   HGBUR NEGATIVE 05/12/2023 2016   BILIRUBINUR NEGATIVE 05/12/2023 2016   BILIRUBINUR neg 05/18/2016 0932   KETONESUR NEGATIVE 05/12/2023 2016   PROTEINUR NEGATIVE 05/12/2023 2016   UROBILINOGEN negative 05/18/2016 0932   UROBILINOGEN 1.0 11/23/2014 2250   NITRITE  NEGATIVE 05/12/2023 2016   LEUKOCYTESUR NEGATIVE 05/12/2023 2016   Sepsis Labs: !!!!!!!!!!!!!!!!!!!!!!!!!!!!!!!!!!!!!!!!!!!! @LABRCNTIP (procalcitonin:4,lacticidven:4) )No results found for this or any previous visit (from the past 240 hours).   Radiological Exams on Admission: CT Knee Right Wo Contrast Result Date: 11/09/2023 CLINICAL DATA:  Knee pain.  Fell yesterday. EXAM: CT OF THE RIGHT KNEE WITHOUT CONTRAST TECHNIQUE: Multidetector CT imaging of the right knee was performed according to the standard protocol. Multiplanar CT image reconstructions were also generated. RADIATION DOSE REDUCTION: This exam was performed according to the departmental dose-optimization program which includes automated exposure control, adjustment of the mA and/or kV according to patient size and/or use of iterative reconstruction technique. COMPARISON:  Radiographs 11/08/2023 FINDINGS: No acute fracture is identified. Age related osteoporosis and advanced degenerative joint disease with joint space narrowing, osteophytic spurring and subchondral cystic change. This is most significant in the lateral compartment with there is near bone-on-bone appearance. 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 Chondrocalcinosis is also present and can be seen with CPPD arthropathy. The knee musculature demonstrates age related fatty atrophy but no obvious muscle tear or intramuscular hematoma. Scattered vascular calcifications. No subcutaneous lesions. The quadriceps and patellar tendons are intact. IMPRESSION: 1. No acute fracture is identified. 2. Age related osteoporosis and advanced degenerative joint disease. 3. 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 4. Chondrocalcinosis is also present and can be seen with CPPD arthropathy. 5. Age related fatty atrophy of the knee  musculature but no obvious muscle tear or intramuscular hematoma. Electronically Signed   By: Marrian Siva M.D.   On: 11/09/2023 13:57   DG Ankle 2 Views Left Result Date: 11/08/2023 CLINICAL DATA:  Left ankle pain after fall. EXAM: LEFT  ANKLE - 2 VIEW COMPARISON:  None Available. FINDINGS: There is no evidence of fracture, dislocation, or joint effusion. There is no evidence of arthropathy or other focal bone abnormality. Soft tissues are unremarkable. IMPRESSION: Negative. Electronically Signed   By: Rosalene Colon M.D.   On: 11/08/2023 17:25   DG Knee 2 Views Right Result Date: 11/08/2023 CLINICAL DATA:  Right knee pain after fall. EXAM: RIGHT KNEE - 1-2 VIEW COMPARISON:  None Available. FINDINGS: Small suprapatellar joint effusion is noted. No fracture or dislocation is noted. Severe narrowing and osteophyte formation is seen involving lateral joint space. Mild osteophyte formation is noted medially. IMPRESSION: Small suprapatellar joint effusion. Severe degenerative joint disease is noted laterally. No fracture or dislocation. Electronically Signed   By: Rosalene Colon M.D.   On: 11/08/2023 17:24    Nutritional status  All images have been reviewed by me personally.    Assessment/Plan Principal Problem:   Knee effusion, right Active Problems:   Cellulitis of left lower extremity   CKD (chronic kidney disease) stage 3, GFR 30-59 ml/min (HCC)   COPD (chronic obstructive pulmonary disease) (HCC)   Paroxysmal atrial fibrillation (HCC)   Type 2 diabetes mellitus with hyperlipidemia (HCC)   Essential hypertension   Persistent atrial fibrillation (HCC)    Left lower extremity cellulitis without purulent discharge - Does not appear to have any deeper infection.  Will continue IV Rocephin  for now and monitor her symptomatically.  Right knee pain with large effusion - Appears to be traumatic after her fall on her right knee.  CT does not show any evidence of fracture.  I have notified  [facility] to review the scans and give further recommendations.  For now knee immobilizer is in place  Paroxysmal atrial fibrillation - Patient is on amiodarone , Toprol -XL and Eliquis .  Congestive heart failure with preserved EF 65% - Appears to be euvolemic in nature at this time.  Will resume her home medication once confirmed by pharmacy team.  Hyperlipidemia - Continue home medications  History of VT status post ICD - Continue home amiodarone   CKD stage IIIb -Creatinine currently at baseline of 1.7  COPD - As needed bronchodilators  Hypothyroidism -Will resume home medication once confirmed by pharmacy  Essential hypertension -Resume home meds.  IV as needed  Obstructive sleep apnea -Will order bedtime CPAP  Diabetes mellitus type 2 -Sliding scale and Accu-Cheks   DVT prophylaxis: Eliquis  Code Status: Full code Family Communication: Daughter at bedside Consults called: [facility] PA, Lucrezia Sachs called who will review scans with Dr. Carry Clapper Admission status: Inpatient admission to telemetry  Status is: Inpatient Remains inpatient appropriate because: Inpatient admission   Time Spent: 65 minutes.  >50% of the time was devoted to discussing the patients care, assessment, plan and disposition with other care givers along with counseling the patient about the risks and benefits of treatment.    Maggie Schooner MD [facility]  If 7PM-7AM, please contact night-coverage   11/09/2023, 3:39 PM

## 2023-11-09 NOTE — ED Triage Notes (Signed)
 Pt BIB [facility] from home. Pt fell yesterday onto her knees and injured her R knee. Pt states she fell because her legs gave out. Denies dizziness or weakness prior to the fall. Pt did not hit her head and she did not have LOC. Pt is currently receiving in home PT for difficulty walking due to 4 ruptured disc.   EMS Vitals  126/P HR 65 RR 20 SpO2 96% CBG 185

## 2023-11-09 NOTE — ED Notes (Signed)
 Pt is also receiving Rocephin  injection for a cellulitis in her R ankle from her orthopedic and she reports she is due today. She is wondering if we can administer that today.

## 2023-11-09 NOTE — Progress Notes (Signed)
 Orthopedic Tech Progress Note Patient Details:  Heidi Stark 11-Jul-1941 [id number]  Ortho Devices Type of Ortho Device: Knee Immobilizer Ortho Device/Splint Location: RLE Ortho Device/Splint Interventions: Application   Post Interventions Patient Tolerated: Well  Heidi Stark Heidi Stark 11/09/2023, 2:41 PM

## 2023-11-10 DIAGNOSIS — M25461 Effusion, right knee: Secondary | ICD-10-CM | POA: Diagnosis not present

## 2023-11-10 LAB — SYNOVIAL CELL COUNT + DIFF, W/ CRYSTALS
Crystals, Fluid: NONE SEEN
Lymphocytes-Synovial Fld: 1 % (ref 0–20)
Monocyte-Macrophage-Synovial Fluid: 9 % — ABNORMAL LOW (ref 50–90)
Neutrophil, Synovial: 90 % — ABNORMAL HIGH (ref 0–25)
WBC, Synovial: 27720 /mm3 — ABNORMAL HIGH (ref 0–200)

## 2023-11-10 LAB — BASIC METABOLIC PANEL WITH GFR
Anion gap: 13 (ref 5–15)
BUN: 21 mg/dL (ref 8–23)
CO2: 22 mmol/L (ref 22–32)
Calcium: 8.2 mg/dL — ABNORMAL LOW (ref 8.9–10.3)
Chloride: 100 mmol/L (ref 98–111)
Creatinine, Ser: 1.25 mg/dL — ABNORMAL HIGH (ref 0.44–1.00)
GFR, Estimated: 43 mL/min — ABNORMAL LOW (ref >60)
Glucose, Bld: 126 mg/dL — ABNORMAL HIGH (ref 70–99)
Potassium: 4.4 mmol/L (ref 3.5–5.1)
Sodium: 135 mmol/L (ref 135–145)

## 2023-11-10 LAB — CBC
HCT: 45.9 % (ref 36.0–46.0)
Hemoglobin: 14.6 g/dL (ref 12.0–15.0)
MCH: 31.5 pg (ref 26.0–34.0)
MCHC: 31.8 g/dL (ref 30.0–36.0)
MCV: 98.9 fL (ref 80.0–100.0)
Platelets: 156 10*3/uL (ref 150–400)
RBC: 4.64 MIL/uL (ref 3.87–5.11)
RDW: 16.2 % — ABNORMAL HIGH (ref 11.5–15.5)
WBC: 11.4 10*3/uL — ABNORMAL HIGH (ref 4.0–10.5)
nRBC: 0 % (ref 0.0–0.2)

## 2023-11-10 LAB — GLUCOSE, CAPILLARY
Glucose-Capillary: 143 mg/dL — ABNORMAL HIGH (ref 70–99)
Glucose-Capillary: 129 mg/dL — ABNORMAL HIGH (ref 70–99)
Glucose-Capillary: 117 mg/dL — ABNORMAL HIGH (ref 70–99)
Glucose-Capillary: 160 mg/dL — ABNORMAL HIGH (ref 70–99)

## 2023-11-10 LAB — PHOSPHORUS: Phosphorus: 3.9 mg/dL (ref 2.5–4.6)

## 2023-11-10 LAB — MAGNESIUM: Magnesium: 2.5 mg/dL — ABNORMAL HIGH (ref 1.7–2.4)

## 2023-11-10 MED ORDER — SACUBITRIL-VALSARTAN 24-26 MG PO TABS
1.0000 | ORAL_TABLET | Freq: Two times a day (BID) | ORAL | Status: DC
  Administered 2023-11-10 – 2023-11-14 (×9): 1 via ORAL
  Filled 2023-11-10 (×10): qty 1

## 2023-11-10 MED ORDER — SODIUM CHLORIDE 0.9 % IV SOLN
2.0000 g | INTRAVENOUS | Status: DC
  Administered 2023-11-11 – 2023-11-13 (×3): 2 g via INTRAVENOUS
  Filled 2023-11-10 (×4): qty 20

## 2023-11-10 MED ORDER — METOPROLOL SUCCINATE ER 25 MG PO TB24
25.0000 mg | ORAL_TABLET | Freq: Every day | ORAL | Status: DC
  Administered 2023-11-10 – 2023-11-13 (×3): 25 mg via ORAL
  Filled 2023-11-10 (×5): qty 1

## 2023-11-10 NOTE — Progress Notes (Signed)
   11/10/23 2253  BiPAP/CPAP/SIPAP  $ Non-Invasive Home Ventilator  Initial  $ Face Mask Medium Yes  BiPAP/CPAP/SIPAP Pt Type Adult  BiPAP/CPAP/SIPAP Resmed  Mask Type Full face mask  Dentures removed? No  Mask Size Medium  Respiratory Rate 18 breaths/min  EPAP 8 cmH2O  FiO2 (%) 21 %  Patient Home Machine No  Patient Home Mask No  Patient Home Tubing No  Auto Titrate No  Nasal massage performed Yes  CPAP/SIPAP surface wiped down  (initial use)  BiPAP/CPAP /SiPAP Vitals  Resp 19  SpO2 94 %  Bilateral Breath Sounds Diminished  MEWS Score/Color  MEWS Score 1  MEWS Score Color Green

## 2023-11-10 NOTE — Plan of Care (Signed)

## 2023-11-10 NOTE — Hospital Course (Addendum)
 Brief Narrative:   83 y.o. female with medical history significant of with past medical history of HTN, DM 2, HLD, P A-fib on Eliquis , splenic and renal infarcts, congestive heart failure with recovered ejection fraction 65%, hypothyroidism, history of VT comes to the hospital with complaints of right knee pain.  Patient diagnosed with left lower extremity cellulitis and has a right knee large effusion seen on CT scan.  Orthopedic, Dr. Carry Clapper consulted will see the patient this morning.  Patient was also started on IV Rocephin  for left lower extremity cellulitis.  Assessment & Plan:  Principal Problem:   Knee effusion, right Active Problems:   Cellulitis of left lower extremity   CKD (chronic kidney disease) stage 3, GFR 30-59 ml/min (HCC)   COPD (chronic obstructive pulmonary disease) (HCC)   Paroxysmal atrial fibrillation (HCC)   Type 2 diabetes mellitus with hyperlipidemia (HCC)   Essential hypertension   Persistent atrial fibrillation (HCC)    Left lower extremity cellulitis without purulent discharge - Does not appear to have any deeper infection.  Will continue IV Rocephin  for now and monitor her symptomatically.   Right knee pain with large effusion - Appears to be traumatic after her fall on her right knee.  CT does not show any evidence of fracture.  Knee immobilizer in place.  Orthopedic perform arthrocentesis and the aspirate appeared to be hemarthrosis therefore sent off for further analysis.  Appreciate help from orthopedic team.   Paroxysmal atrial fibrillation - Patient is on amiodarone , Toprol -XL and Eliquis .  IV as needed   Congestive heart failure with preserved EF 65% - Appears to be euvolemic in nature at this time. - Continue current medication including Eliquis , Vascepa , Toprol -XL, Entresto , Farxiga .  Will resume eplerenone  upon discharge   Hyperlipidemia -vascepa    History of VT status post ICD - Continue home amiodarone  and mexiletine   CKD stage  IIIb -Creatinine currently at baseline of 1.7   COPD - As needed bronchodilators   Hypothyroidism - Synthroid    Essential hypertension -Resume home meds.  IV as needed   Obstructive sleep apnea -Will order bedtime CPAP   Diabetes mellitus type 2 -Sliding scale and Accu-Cheks   PT/OT  DVT prophylaxis: SCDs Start: 11/09/23 1529 apixaban  (ELIQUIS ) tablet 5 mg      Code Status: Full Code Family Communication:  Renee updated Status is: Inpatient Ongoing evaluation for right traumatic knee effusion    Subjective:  Still having right knee pain and getting ready for Ortho centesis while I was in the room.  Examination:  General exam: Appears calm and comfortable  Respiratory system: Clear to auscultation. Respiratory effort normal. Cardiovascular system: S1 & S2 heard, RRR. No JVD, murmurs, rubs, gallops or clicks. No pedal edema. Gastrointestinal system: Abdomen is nondistended, soft and nontender. No organomegaly or masses felt. Normal bowel sounds heard. Central nervous system: Alert and oriented. No focal neurological deficits. Extremities: Symmetric 5 x 5 power. Skin: left lower ext erythema, significantly improved. Knee immobilizer in place.  Psychiatry: Judgement and insight appear normal. Mood & affect appropriate.

## 2023-11-10 NOTE — Progress Notes (Signed)
 OT Cancellation Note  Patient Details Name: Heidi Stark MRN: [id number] DOB: 11-27-1940   Cancelled Treatment:    Reason Eval/Treat Not Completed: Fatigue/lethargy limiting ability to participate Patient sleeping with soft snores noted in bed. OT to continue to follow and check back as schedule will allow.  Wynette Heckler, MS Acute Rehabilitation Department Office# [phone]  11/10/2023, 2:45 PM

## 2023-11-10 NOTE — Evaluation (Signed)
 Physical Therapy Evaluation Patient Details Name: Heidi Stark MRN: [id number] DOB: [birth year] Today's Date: 11/10/2023  History of Present Illness  Heidi Stark is a 83 y.o. female with medical history significant of with past medical history of HTN, DM 2, HLD, P A-fib on Eliquis , splenic and renal infarcts, congestive heart failure with recovered ejection fraction 65%, hypothyroidism, history of VT comes to the hospital with complaints of right knee pain.  Clinical Impression  Pt admitted with above diagnosis. Pt lethargic upon PT arrival, improves as session progresses, but mostly flat affect. Pt dons immobilizer to RLE, recent ortho centesis for R knee. Mod A to come to sit EOB, once upright is able to maintain balance without physical assist, uses walker for support. Reports feeling dizzy without improvement, endorses diaphoresis, returns to supine, nursing notified. Based on pt PLOF, level of support, and current functional status, she would benefit from skilled PT <3 hours per day at dc.   Pt currently with functional limitations due to the deficits listed below (see PT Problem List). Pt will benefit from acute skilled PT to increase their independence and safety with mobility to allow discharge.           If plan is discharge home, recommend the following: A lot of help with walking and/or transfers;A lot of help with bathing/dressing/bathroom;Assist for transportation;Help with stairs or ramp for entrance;Assistance with cooking/housework   Can travel by private vehicle   No    Equipment Recommendations None recommended by PT  Recommendations for Other Services       Functional Status Assessment Patient has had a recent decline in their functional status and demonstrates the ability to make significant improvements in function in a reasonable and predictable amount of time.     Precautions / Restrictions Precautions Precautions: Fall Recall of Precautions/Restrictions:  Intact Required Braces or Orthoses: Knee Immobilizer - Right Knee Immobilizer - Right: On when out of bed or walking Restrictions Weight Bearing Restrictions Per Provider Order: Yes RLE Weight Bearing Per Provider Order: Weight bearing as tolerated      Mobility  Bed Mobility Overal bed mobility: Needs Assistance Bed Mobility: Supine to Sit, Sit to Supine     Supine to sit: Mod assist Sit to supine: Mod assist   General bed mobility comments: max A for scooting up in bed    Transfers                        Ambulation/Gait                  Stairs            Wheelchair Mobility     Tilt Bed    Modified Rankin (Stroke Patients Only)       Balance Overall balance assessment: Needs assistance Sitting-balance support: Feet supported, Single extremity supported Sitting balance-Leahy Scale: Poor                                       Pertinent Vitals/Pain Pain Assessment Pain Assessment: 0-10 Pain Score: 8  Pain Location: R knee/hip Pain Descriptors / Indicators: Aching, Constant, Grimacing, Discomfort, Nagging Pain Intervention(s): Limited activity within patient's tolerance, Monitored during session, Repositioned    Home Living Family/patient expects to be discharged to:: Private residence Living Arrangements: Children Available Help at Discharge: Family;Available 24 hours/day Type of Home: Other(Comment) (townhouse) Home Access:  Stairs to enter Entrance Stairs-Rails: None Entrance Stairs-Number of Steps: 3   Home Layout: One level Home Equipment: Agricultural consultant (2 wheels);Rollator (4 wheels);Electric scooter;BSC/3in1;Lift chair;Grab bars - toilet      Prior Function Prior Level of Function : Needs assist       Physical Assist : ADLs (physical)   ADLs (physical): Toileting;Bathing Mobility Comments: Generally walks with 4WRW, scooter for community mobility       Extremity/Trunk Assessment        Lower  Extremity Assessment Lower Extremity Assessment: Generalized weakness;RLE deficits/detail RLE: Unable to fully assess due to pain    Cervical / Trunk Assessment Cervical / Trunk Assessment: Kyphotic  Communication   Communication Communication: No apparent difficulties    Cognition Arousal: Lethargic Behavior During Therapy: WFL for tasks assessed/performed   PT - Cognitive impairments: No apparent impairments                         Following commands: Intact       Cueing Cueing Techniques: Verbal cues, Visual cues     General Comments General comments (skin integrity, edema, etc.): recent orthocentesis on R knee, ace wrapped, immobilizer in room    Exercises     Assessment/Plan    PT Assessment Patient needs continued PT services  PT Problem List Decreased strength;Decreased activity tolerance;Decreased mobility;Decreased range of motion;Decreased balance       PT Treatment Interventions DME instruction;Functional mobility training;Balance training;Patient/family education;Gait training;Therapeutic activities;Stair training;Therapeutic exercise    PT Goals (Current goals can be found in the Care Plan section)  Acute Rehab PT Goals Patient Stated Goal: return home PT Goal Formulation: With patient Time For Goal Achievement: 11/24/23 Potential to Achieve Goals: Fair    Frequency Min 3X/week     Co-evaluation               AM-PAC PT "6 Clicks" Mobility  Outcome Measure Help needed turning from your back to your side while in a flat bed without using bedrails?: A Lot Help needed moving from lying on your back to sitting on the side of a flat bed without using bedrails?: A Lot Help needed moving to and from a bed to a chair (including a wheelchair)?: Total Help needed standing up from a chair using your arms (e.g., wheelchair or bedside chair)?: Total Help needed to walk in hospital room?: Total Help needed climbing 3-5 steps with a railing? :  Total 6 Click Score: 8    End of Session Equipment Utilized During Treatment: Gait belt Activity Tolerance: Treatment limited secondary to medical complications (Comment) (pt diaphoretic and dizzy sitting EOB, does not improve. return to supine) Patient left: in bed;with call bell/phone within reach Nurse Communication: Mobility status PT Visit Diagnosis: Muscle weakness (generalized) (M62.81);Difficulty in walking, not elsewhere classified (R26.2)    Time: 1610-9604 PT Time Calculation (min) (ACUTE ONLY): 46 min   Charges:   PT Evaluation $PT Eval Low Complexity: 1 Low PT Treatments $Therapeutic Activity: 23-37 mins PT General Charges $$ ACUTE PT VISIT: 1 Visit         Darien Eden, PT Acute Rehabilitation Services Office: [phone] 11/10/2023   Serafin Dames 11/10/2023, 11:23 AM

## 2023-11-10 NOTE — Consult Note (Signed)
 Wendolyn Hamburger, MD           Lucrezia Sachs, PA-C  [facility]   [address] [place], [place]  [address]   ORTHOPAEDIC CONSULTATION  KENNEISHA STANBACK            MRN:  [id number] DOB/SEX:  07-11-1941/female    REQUESTING PHYSICIAN:    CHIEF COMPLAINT:  Painful right knee  HISTORY: LAVERLE FARVER a 83 y.o. female with a painful right knee.  She was currently being treated for left lower leg cellulitis when she had a fall at home landing on her right knee.  She went to the emergency department with a swollen painful right knee where she was unable to bear weight.  CT scan was done which showed a large effusion.  Orthopedics was consulted to make sure that she did not have a septic right knee.  She is on Eliquis  baseline.   PAST MEDICAL HISTORY: Patient Active Problem List   Diagnosis Date Noted   Knee effusion, right 11/09/2023   Type 2 diabetes mellitus with hyperlipidemia (HCC) 05/16/2023   Hypothyroidism 05/16/2023   Cellulitis of left lower extremity 05/13/2023   Compression fracture of lumbar spine, non-traumatic, sequela 05/13/2023   Acute on chronic diastolic CHF (congestive heart failure) (HCC) 05/13/2023   Diabetic nephropathy (HCC) 10/13/2021   Hyperglycemia due to type 2 diabetes mellitus (HCC) 10/13/2021   Proteinuria 10/13/2021   Pure hypercholesterolemia 10/13/2021   COVID-19 04/15/2021   Respiratory failure with hypoxia (HCC) 04/15/2021   Systolic CHF (HCC) 04/15/2021   CKD (chronic kidney disease) stage 3, GFR 30-59 ml/min (HCC) 04/15/2021   Elevated troponin 04/15/2021   Acute on chronic systolic (congestive) heart failure (HCC) 09/14/2020   Wide-complex tachycardia 09/07/2020   Ventricular tachyarrhythmia (HCC) 09/07/2020   VT (ventricular tachycardia) (HCC) 09/07/2020   Persistent atrial fibrillation (HCC) 04/30/2018   Wound of left leg 10/01/2017   Cellulitis of right lower extremity 11/27/2016   Encounter for assessment for deep  vein thrombosis (DVT) 11/14/2016   Hypokalemia 08/13/2016   Type 2 diabetes mellitus with hyperglycemia, with long-term current use of insulin  (HCC)    Atherosclerosis of aorta (HCC) 06/26/2016   Colitis 11/24/2014   Severe obesity (BMI >= 40) (HCC) 11/12/2013   Paroxysmal atrial fibrillation (HCC) 11/03/2013   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 10/28/2013   COLONIC POLYPS 08/16/2010   PAD (peripheral artery disease) (HCC) 08/15/2010   HLD (hyperlipidemia) 11/18/2009   MYALGIA 11/18/2009   OBESITY 05/14/2007   PULMONARY NODULE, RIGHT MIDDLE LOBE 05/14/2007   Obstructive sleep apnea 12/06/2006   Essential hypertension 12/06/2006   COPD (chronic obstructive pulmonary disease) (HCC) 12/06/2006   Past Medical History:  Diagnosis Date   Arthritis    "hands, wrists" (11/07/2016)   CHF (congestive heart failure) (HCC)    Chronic lower back pain    Constipation    DVT (deep venous thrombosis) (HCC)    History of blood transfusion 1990   "related to OR"   History of kidney stones    Hyperlipidemia    Hypertension    Joint pain    Lower extremity edema    OSA on CPAP    Persistent atrial fibrillation (HCC)    Pneumonia    "several times" (11/07/2016)   Type II diabetes mellitus (HCC)    Past Surgical History:  Procedure Laterality Date   ATRIAL FIBRILLATION ABLATION N/A 11/07/2016   Procedure: Atrial Fibrillation Ablation;  Surgeon: Will  Cortland Ding, MD;  Location[facility] INVASIVE CV LAB;  Service: Cardiovascular;  Laterality: N/A;   BACK SURGERY     CARDIAC CATHETERIZATION     CARDIOVERSION N/A 08/11/2016   Procedure: CARDIOVERSION;  Surgeon: Darlis Eisenmenger, MD;  Location: [facility] ENDOSCOPY;  Service: Cardiovascular;  Laterality: N/A;   CARDIOVERSION N/A 12/03/2017   Procedure: CARDIOVERSION;  Surgeon: Darlis Eisenmenger, MD;  Location: [facility] ENDOSCOPY;  Service: Cardiovascular;  Laterality: N/A;   CARDIOVERSION N/A 05/02/2018   Procedure: CARDIOVERSION;   Surgeon: Hazle Lites, MD;  Location: [facility] ENDOSCOPY;  Service: Cardiovascular;  Laterality: N/A;   CARDIOVERSION N/A 06/23/2020   Procedure: CARDIOVERSION;  Surgeon: Sonny Dust, MD;  Location: [facility] ENDOSCOPY;  Service: Cardiovascular;  Laterality: N/A;   CARDIOVERSION N/A 10/11/2020   Procedure: CARDIOVERSION;  Surgeon: Darlis Eisenmenger, MD;  Location: [facility] ENDOSCOPY;  Service: Cardiovascular;  Laterality: N/A;   CARPAL TUNNEL RELEASE     CATARACT EXTRACTION W/ INTRAOCULAR LENS  IMPLANT, BILATERAL Bilateral    COLON SURGERY  1990   vein graft and colon repair after nicked artery with back surgert   COLONOSCOPY     CORONARY ANGIOGRAPHY N/A 09/08/2020   Procedure: CORONARY ANGIOGRAPHY;  Surgeon: Darlis Eisenmenger, MD;  Location: [facility] INVASIVE CV LAB;  Service: Cardiovascular;  Laterality: N/A;   CYSTECTOMY     between bladder and kidneys   GANGLION CYST EXCISION Left    ICD IMPLANT N/A 09/15/2020   Procedure: ICD IMPLANT;  Surgeon: Lei Pump, MD;  Location: [facility] INVASIVE CV LAB;  Service: Cardiovascular;  Laterality: N/A;   KNEE CARTILAGE SURGERY Right 1960s   LUMBAR DISC SURGERY  1990   REPAIR ILIAC ARTERY  1990   vein graft and colon repair after nicked artery with back surgert   TEE WITHOUT CARDIOVERSION N/A 10/31/2013   Procedure: TRANSESOPHAGEAL ECHOCARDIOGRAM (TEE);  Surgeon: Darlis Eisenmenger, MD;  Location: [facility] ENDOSCOPY;  Service: Cardiovascular;  Laterality: N/A;   TUBAL LIGATION       MEDICATIONS:   Current Facility-Administered Medications:    acetaminophen  (TYLENOL ) tablet 650 mg, 650 mg, Oral, Q6H PRN, 650 mg at 11/10/23 0821 **OR** acetaminophen  (TYLENOL ) suppository 650 mg, 650 mg, Rectal, Q6H PRN, Amin, Ankit C, MD   amiodarone  (PACERONE ) tablet 200 mg, 200 mg, Oral, Daily, Amin, Ankit C, MD, 200 mg at 11/10/23 0820   apixaban  (ELIQUIS ) tablet 5 mg, 5 mg, Oral, BID, Amin, Ankit C, MD, 5 mg at 11/10/23 4010   bisacodyl  (DULCOLAX) EC tablet 5 mg, 5 mg, Oral, Daily  PRN, Amin, Ankit C, MD, 5 mg at 11/09/23 1726   cefTRIAXone  (ROCEPHIN ) 1 g in sodium chloride  0.9 % 100 mL IVPB, 1 g, Intravenous, Q24H, Robertson, Crystal S, RPH   dapagliflozin  propanediol (FARXIGA ) tablet 10 mg, 10 mg, Oral, Daily, Amin, Ankit C, MD, 10 mg at 11/10/23 0820   docusate sodium  (COLACE) capsule 100 mg, 100 mg, Oral, BID, Amin, Ankit C, MD, 100 mg at 11/10/23 2725   fenofibrate  tablet 54 mg, 54 mg, Oral, Daily, Amin, Ankit C, MD, 54 mg at 11/10/23 3664   gabapentin  (NEURONTIN ) capsule 300 mg, 300 mg, Oral, Daily PRN, Amin, Ankit C, MD, 300 mg at 11/09/23 2312   guaiFENesin  (MUCINEX ) 12 hr tablet 600 mg, 600 mg, Oral, BID PRN, Amin, Ankit C, MD, 600 mg at 11/09/23 2312   hydrALAZINE  (APRESOLINE ) injection 10 mg, 10 mg, Intravenous, Q4H PRN, Amin, Ankit C, MD   HYDROmorphone  (DILAUDID ) injection 0.5-1 mg, 0.5-1 mg, Intravenous,  Q2H PRN, Amin, Ankit C, MD, 1 mg at 11/10/23 0910   icosapent  Ethyl (VASCEPA ) 1 g capsule 2 g, 2 g, Oral, BID, Amin, Ankit C, MD, 2 g at 11/10/23 8295   insulin  aspart (novoLOG ) injection 0-15 Units, 0-15 Units, Subcutaneous, TID WC, Amin, Ankit C, MD, 2 Units at 11/10/23 6213   insulin  aspart (novoLOG ) injection 0-5 Units, 0-5 Units, Subcutaneous, QHS, Amin, Ankit C, MD   ipratropium-albuterol  (DUONEB) 0.5-2.5 (3) MG/3ML nebulizer solution 3 mL, 3 mL, Nebulization, Q4H PRN, Amin, Ankit C, MD   levothyroxine  (SYNTHROID ) tablet 25 mcg, 25 mcg, Oral, Q0600, Amin, Ankit C, MD, 25 mcg at 11/10/23 0500   LORazepam  (ATIVAN ) tablet 0.5 mg, 0.5 mg, Oral, QHS PRN, Amin, Ankit C, MD   methocarbamol (ROBAXIN) injection 500 mg, 500 mg, Intravenous, Q6H PRN, Amin, Ankit C, MD, 500 mg at 11/10/23 0865   metoprolol  succinate (TOPROL -XL) 24 hr tablet 25 mg, 25 mg, Oral, Daily, Amin, Ankit C, MD, 25 mg at 11/10/23 7846   metoprolol  tartrate (LOPRESSOR ) injection 10 mg, 10 mg, Intravenous, Q4H PRN, Amin, Ankit C, MD   metoprolol  tartrate (LOPRESSOR ) injection 5 mg, 5 mg,  Intravenous, Q4H PRN, Amin, Ankit C, MD   mexiletine (MEXITIL ) capsule 300 mg, 300 mg, Oral, BID, Amin, Ankit C, MD, 300 mg at 11/10/23 0820   nicotine  (NICODERM CQ  - dosed in mg/24 hours) patch 21 mg, 21 mg, Transdermal, Daily PRN, Amin, Ankit C, MD   ondansetron  (ZOFRAN ) injection 4 mg, 4 mg, Intravenous, Q6H PRN, Amin, Ankit C, MD, 4 mg at 11/09/23 1726   Oral care mouth rinse, 15 mL, Mouth Rinse, PRN, Amin, Ankit C, MD   oxyCODONE  (Oxy IR/ROXICODONE ) immediate release tablet 5 mg, 5 mg, Oral, Q4H PRN, Amin, Ankit C, MD, 5 mg at 11/09/23 2312   sacubitril -valsartan  (ENTRESTO ) 24-26 mg per tablet, 1 tablet, Oral, BID, Amin, Ankit C, MD, 1 tablet at 11/10/23 0820   senna-docusate (Senokot-S) tablet 1 tablet, 1 tablet, Oral, QHS PRN, Amin, Ankit C, MD   sertraline  (ZOLOFT ) tablet 25 mg, 25 mg, Oral, Daily, Amin, Ankit C, MD, 25 mg at 11/10/23 9629   sodium phosphate  (FLEET) enema 1 enema, 1 enema, Rectal, Once PRN, Amin, Ankit C, MD   traZODone (DESYREL) tablet 50 mg, 50 mg, Oral, QHS PRN, Amin, Ankit C, MD  ALLERGIES:   Allergies  Allergen Reactions   Neomycin-Bacitracin Zn-Polymyx Rash   Sulfa Antibiotics Diarrhea and Nausea And Vomiting   Ciprofloxacin  Hives, Itching and Rash    Pt doesn't remember having any reaction to cipro    Spironolactone  Rash    REVIEW OF SYSTEMS: REVIEWED IN DETAIL IN CHART  FAMILY HISTORY:   Family History  Problem Relation Age of Onset   Diabetes Mother    Hypertension Mother    Cancer Mother    Obesity Mother    Diabetes Father    Obesity Father    Diabetes Other    Melanoma Other    Factor V Leiden deficiency Daughter     SOCIAL HISTORY:   Social History   Tobacco Use   Smoking status: Former    Current packs/day: 0.00    Average packs/day: 1 pack/day for 50.0 years (50.0 ttl pk-yrs)    Types: Cigarettes    Start date: 08/17/1970    Quit date: 08/17/2020    Years since quitting: 3.2   Smokeless tobacco: Never   Tobacco comments:    Quit  2022. [place]  Substance Use Topics   Alcohol use:  No    Alcohol/week: 0.0 standard drinks of alcohol     EXAMINATION: Vital signs in last 24 hours: Temp:  [97.8 F (36.6 C)-99.3 F (37.4 C)] 97.8 F (36.6 C) (04/26 0506) Pulse Rate:  [52-70] 69 (04/26 0506) Resp:  [14-20] 20 (04/26 0506) BP: (87-115)/(39-59) 90/50 (04/26 0506) SpO2:  [93 %-100 %] 93 % (04/26 0506) Weight:  [113.4 kg-116.1 kg] 116.1 kg (04/26 0506)  General appearance: alert and cooperative Head: Normocephalic, without obvious abnormality, atraumatic Extremities: extremities normal, atraumatic, no cyanosis or edema and Homans sign is negative, no sign of DVT  Musculoskeletal Exam  :examination of the right knee shows 1+ effusion.  Skin is benign.  She has very limited range of motion due to pain.  Range of motion is from 0 to about 15 of flexion.  She has diffuse tenderness to palpation throughout.  Calf is soft and nontender.  Procedure:  Risks, benefits and complications were reviewed with the patient. After obtaining informed verbal consent the right knee was sterilely prepped with alcohol.  Ethyl chloride was used to anesthetize the skin and intra-articular injection of lidocaine  using a 25-gauge by 1-1/2 inch needle was accomplished without difficulty to numb the area.  We then aspirated 50 cc of bloody fluid. Patient tolerated the procedure well and was observed for 10 minutes. Post injection instructions, including signs and symptoms of complications were discussed.   DIAGNOSTIC STUDIES: Recent laboratory studies: Recent Labs    11/08/23 1651 11/09/23 1253 11/10/23 0615  WBC 9.7 12.8* 11.4*  HGB 15.5* 15.9* 14.6  HCT 47.3* 52.0* 45.9  PLT 171 190 156   Recent Labs    11/08/23 1651 11/09/23 1253 11/10/23 0615  NA 137 137 135  K 4.4 4.3 4.4  CL 97* 98 100  CO2 28 28 22   BUN 21 18 21   CREATININE 1.28* 1.07* 1.25*  GLUCOSE 85 106* 126*  CALCIUM  9.1 8.8* 8.2*   Lab Results  Component Value Date    INR 1.3 (H) 09/13/2020   INR 1.2 09/12/2020   INR 1.2 09/07/2020     Recent Radiographic Studies :  CT Knee Right Wo Contrast Result Date: 11/09/2023 CLINICAL DATA:  Knee pain.  Fell yesterday. EXAM: CT OF THE RIGHT KNEE WITHOUT CONTRAST TECHNIQUE: Multidetector CT imaging of the right knee was performed according to the standard protocol. Multiplanar CT image reconstructions were also generated. RADIATION DOSE REDUCTION: This exam was performed according to the departmental dose-optimization program which includes automated exposure control, adjustment of the mA and/or kV according to patient size and/or use of iterative reconstruction technique. COMPARISON:  Radiographs 11/08/2023 FINDINGS: No acute fracture is identified. Age related osteoporosis and advanced degenerative joint disease with joint space narrowing, osteophytic spurring and subchondral cystic change. This is most significant in the lateral compartment with there is near bone-on-bone appearance. 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 Chondrocalcinosis is also present and can be seen with CPPD arthropathy. The knee musculature demonstrates age related fatty atrophy but no obvious muscle tear or intramuscular hematoma. Scattered vascular calcifications. No subcutaneous lesions. The quadriceps and patellar tendons are intact. IMPRESSION: 1. No acute fracture is identified. 2. Age related osteoporosis and advanced degenerative joint disease. 3. 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 4. Chondrocalcinosis is also present and can be seen with CPPD arthropathy. 5. Age related fatty atrophy of the knee musculature  but no obvious muscle tear or intramuscular hematoma. Electronically Signed   By: Marrian Siva M.D.   On: 11/09/2023 13:57   DG Ankle 2 Views Left Result Date:  11/08/2023 CLINICAL DATA:  Left ankle pain after fall. EXAM: LEFT ANKLE - 2 VIEW COMPARISON:  None Available. FINDINGS: There is no evidence of fracture, dislocation, or joint effusion. There is no evidence of arthropathy or other focal bone abnormality. Soft tissues are unremarkable. IMPRESSION: Negative. Electronically Signed   By: Rosalene Colon M.D.   On: 11/08/2023 17:25   DG Knee 2 Views Right Result Date: 11/08/2023 CLINICAL DATA:  Right knee pain after fall. EXAM: RIGHT KNEE - 1-2 VIEW COMPARISON:  None Available. FINDINGS: Small suprapatellar joint effusion is noted. No fracture or dislocation is noted. Severe narrowing and osteophyte formation is seen involving lateral joint space. Mild osteophyte formation is noted medially. IMPRESSION: Small suprapatellar joint effusion. Severe degenerative joint disease is noted laterally. No fracture or dislocation. Electronically Signed   By: Rosalene Colon M.D.   On: 11/08/2023 17:24    ASSESSMENT: Right knee likely hemarthrosis   PLAN: After discussing with the patient we elected to aspirate her right knee.  I got 50 cc of bloody appearing synovial fluid.  This likely represents a simple hemarthrosis after her recent fall being on Eliquis .  We are going to send this off to the lab for Gram stain, cell count, cultures with sensitivities, and crystal analysis.  I have a very low suspicion of this being a septic joint.  She can be weightbearing as tolerated.  If she feels better in the immobilizer she can wear it but she does not necessarily have to.  We greatly appreciate medical management and will continue to follow while she is in the hospital.  Geraldina Klinefelter Elexia Friedt 11/10/2023, 9:12 AM

## 2023-11-10 NOTE — Progress Notes (Signed)
 PROGRESS NOTE    Heidi Stark  YSA:[id number] DOB: 08-12-1940 DOA: 11/09/2023 PCP: Estill Hemming, DO    Brief Narrative:   83 y.o. female with medical history significant of with past medical history of HTN, DM 2, HLD, P A-fib on Eliquis , splenic and renal infarcts, congestive heart failure with recovered ejection fraction 65%, hypothyroidism, history of VT comes to the hospital with complaints of right knee pain.  Patient diagnosed with left lower extremity cellulitis and has a right knee large effusion seen on CT scan.  Orthopedic, Dr. Carry Clapper consulted will see the patient this morning.  Patient was also started on IV Rocephin  for left lower extremity cellulitis.  Assessment & Plan:  Principal Problem:   Knee effusion, right Active Problems:   Cellulitis of left lower extremity   CKD (chronic kidney disease) stage 3, GFR 30-59 ml/min (HCC)   COPD (chronic obstructive pulmonary disease) (HCC)   Paroxysmal atrial fibrillation (HCC)   Type 2 diabetes mellitus with hyperlipidemia (HCC)   Essential hypertension   Persistent atrial fibrillation (HCC)    Left lower extremity cellulitis without purulent discharge - Does not appear to have any deeper infection.  Will continue IV Rocephin  for now and monitor her symptomatically.   Right knee pain with large effusion - Appears to be traumatic after her fall on her right knee.  CT does not show any evidence of fracture.  Knee immobilizer in place.  Orthopedic perform arthrocentesis and the aspirate appeared to be hemarthrosis therefore sent off for further analysis.  Appreciate help from orthopedic team.   Paroxysmal atrial fibrillation - Patient is on amiodarone , Toprol -XL and Eliquis .  IV as needed   Congestive heart failure with preserved EF 65% - Appears to be euvolemic in nature at this time. - Continue current medication including Eliquis , Vascepa , Toprol -XL, Entresto , Farxiga .  Will resume eplerenone  upon discharge    Hyperlipidemia -vascepa    History of VT status post ICD - Continue home amiodarone  and mexiletine   CKD stage IIIb -Creatinine currently at baseline of 1.7   COPD - As needed bronchodilators   Hypothyroidism - Synthroid    Essential hypertension -Resume home meds.  IV as needed   Obstructive sleep apnea -Will order bedtime CPAP   Diabetes mellitus type 2 -Sliding scale and Accu-Cheks   PT/OT  DVT prophylaxis: SCDs Start: 11/09/23 1529 apixaban  (ELIQUIS ) tablet 5 mg      Code Status: Full Code Family Communication:  Renee updated Status is: Inpatient Ongoing evaluation for right traumatic knee effusion    Subjective:  Still having right knee pain and getting ready for Ortho centesis while I was in the room.  Examination:  General exam: Appears calm and comfortable  Respiratory system: Clear to auscultation. Respiratory effort normal. Cardiovascular system: S1 & S2 heard, RRR. No JVD, murmurs, rubs, gallops or clicks. No pedal edema. Gastrointestinal system: Abdomen is nondistended, soft and nontender. No organomegaly or masses felt. Normal bowel sounds heard. Central nervous system: Alert and oriented. No focal neurological deficits. Extremities: Symmetric 5 x 5 power. Skin: left lower ext erythema, significantly improved. Knee immobilizer in place.  Psychiatry: Judgement and insight appear normal. Mood & affect appropriate.                Diet Orders (From admission, onward)     Start     Ordered   11/09/23 1529  Diet heart healthy/carb modified Room service appropriate? Yes; Fluid consistency: Thin  Diet effective now  Question Answer Comment  Diet-HS Snack? Nothing   Room service appropriate? Yes   Fluid consistency: Thin      11/09/23 1535            Objective: Vitals:   11/09/23 2035 11/10/23 0051 11/10/23 0335 11/10/23 0506  BP: (!) 106/58 (!) 105/56 (!) 107/52 (!) 90/50  Pulse: 60 69 70 69  Resp: 18 16 16 20   Temp: 98.8  F (37.1 C) 98.7 F (37.1 C) 99.3 F (37.4 C) 97.8 F (36.6 C)  TempSrc: Oral Oral Oral Oral  SpO2: 99% 96% 94% 93%  Weight:    116.1 kg  Height:        Intake/Output Summary (Last 24 hours) at 11/10/2023 1003 Last data filed at 11/10/2023 0325 Gross per 24 hour  Intake 1080 ml  Output 500 ml  Net 580 ml   Filed Weights   11/09/23 1202 11/09/23 1344 11/10/23 0506  Weight: 113.4 kg 114.5 kg 116.1 kg    Scheduled Meds:  amiodarone   200 mg Oral Daily   apixaban   5 mg Oral BID   dapagliflozin  propanediol  10 mg Oral Daily   docusate sodium   100 mg Oral BID   fenofibrate   54 mg Oral Daily   icosapent  Ethyl  2 g Oral BID   insulin  aspart  0-15 Units Subcutaneous TID WC   insulin  aspart  0-5 Units Subcutaneous QHS   levothyroxine   25 mcg Oral Q0600   metoprolol  succinate  25 mg Oral Daily   mexiletine  300 mg Oral BID   sacubitril -valsartan   1 tablet Oral BID   sertraline   25 mg Oral Daily   Continuous Infusions:  cefTRIAXone  (ROCEPHIN )  IV      Nutritional status     Body mass index is 41.31 kg/m.  Data Reviewed:   CBC: Recent Labs  Lab 11/08/23 1651 11/09/23 1253 11/10/23 0615  WBC 9.7 12.8* 11.4*  NEUTROABS 5.7 8.8*  --   HGB 15.5* 15.9* 14.6  HCT 47.3* 52.0* 45.9  MCV 95.0 97.9 98.9  PLT 171 190 156   Basic Metabolic Panel: Recent Labs  Lab 11/08/23 1651 11/09/23 1253 11/10/23 0615  NA 137 137 135  K 4.4 4.3 4.4  CL 97* 98 100  CO2 28 28 22   GLUCOSE 85 106* 126*  BUN 21 18 21   CREATININE 1.28* 1.07* 1.25*  CALCIUM  9.1 8.8* 8.2*  MG  --   --  2.5*  PHOS  --   --  3.9   GFR: Estimated Creatinine Clearance: 45.7 mL/min (A) (by C-G formula based on SCr of 1.25 mg/dL (H)). Liver Function Tests: Recent Labs  Lab 11/09/23 1253  AST 29  ALT 18  ALKPHOS 48  BILITOT 0.6  PROT 7.1  ALBUMIN 3.4*   No results for input(s): "LIPASE", "AMYLASE" in the last 168 hours. No results for input(s): "AMMONIA" in the last 168 hours. Coagulation  Profile: No results for input(s): "INR", "PROTIME" in the last 168 hours. Cardiac Enzymes: No results for input(s): "CKTOTAL", "CKMB", "CKMBINDEX", "TROPONINI" in the last 168 hours. BNP (last 3 results) No results for input(s): "PROBNP" in the last 8760 hours. HbA1C: No results for input(s): "HGBA1C" in the last 72 hours. CBG: Recent Labs  Lab 11/09/23 1240 11/09/23 1714 11/09/23 2036 11/10/23 0729  GLUCAP 112* 130* 162* 143*   Lipid Profile: No results for input(s): "CHOL", "HDL", "LDLCALC", "TRIG", "CHOLHDL", "LDLDIRECT" in the last 72 hours. Thyroid  Function Tests: No results for input(s): "TSH", "T4TOTAL", "FREET4", "  T3FREE", "THYROIDAB" in the last 72 hours. Anemia Panel: No results for input(s): "VITAMINB12", "FOLATE", "FERRITIN", "TIBC", "IRON", "RETICCTPCT" in the last 72 hours. Sepsis Labs: No results for input(s): "PROCALCITON", "LATICACIDVEN" in the last 168 hours.  No results found for this or any previous visit (from the past 240 hours).       Radiology Studies: CT Knee Right Wo Contrast Result Date: 11/09/2023 CLINICAL DATA:  Knee pain.  Fell yesterday. EXAM: CT OF THE RIGHT KNEE WITHOUT CONTRAST TECHNIQUE: Multidetector CT imaging of the right knee was performed according to the standard protocol. Multiplanar CT image reconstructions were also generated. RADIATION DOSE REDUCTION: This exam was performed according to the departmental dose-optimization program which includes automated exposure control, adjustment of the mA and/or kV according to patient size and/or use of iterative reconstruction technique. COMPARISON:  Radiographs 11/08/2023 FINDINGS: No acute fracture is identified. Age related osteoporosis and advanced degenerative joint disease with joint space narrowing, osteophytic spurring and subchondral cystic change. This is most significant in the lateral compartment with there is near bone-on-bone appearance. 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 Chondrocalcinosis is also present and can be seen with CPPD arthropathy. The knee musculature demonstrates age related fatty atrophy but no obvious muscle tear or intramuscular hematoma. Scattered vascular calcifications. No subcutaneous lesions. The quadriceps and patellar tendons are intact. IMPRESSION: 1. No acute fracture is identified. 2. Age related osteoporosis and advanced degenerative joint disease. 3. 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 4. Chondrocalcinosis is also present and can be seen with CPPD arthropathy. 5. Age related fatty atrophy of the knee musculature but no obvious muscle tear or intramuscular hematoma. Electronically Signed   By: Marrian Siva M.D.   On: 11/09/2023 13:57   DG Ankle 2 Views Left Result Date: 11/08/2023 CLINICAL DATA:  Left ankle pain after fall. EXAM: LEFT ANKLE - 2 VIEW COMPARISON:  None Available. FINDINGS: There is no evidence of fracture, dislocation, or joint effusion. There is no evidence of arthropathy or other focal bone abnormality. Soft tissues are unremarkable. IMPRESSION: Negative. Electronically Signed   By: Rosalene Colon M.D.   On: 11/08/2023 17:25   DG Knee 2 Views Right Result Date: 11/08/2023 CLINICAL DATA:  Right knee pain after fall. EXAM: RIGHT KNEE - 1-2 VIEW COMPARISON:  None Available. FINDINGS: Small suprapatellar joint effusion is noted. No fracture or dislocation is noted. Severe narrowing and osteophyte formation is seen involving lateral joint space. Mild osteophyte formation is noted medially. IMPRESSION: Small suprapatellar joint effusion. Severe degenerative joint disease is noted laterally. No fracture or dislocation. Electronically Signed   By: Rosalene Colon M.D.   On: 11/08/2023 17:24           LOS: 1 day   Time spent= 35 mins    Maggie Schooner,  MD [facility]  If 7PM-7AM, please contact night-coverage  11/10/2023, 10:03 AM

## 2023-11-11 DIAGNOSIS — M25461 Effusion, right knee: Secondary | ICD-10-CM | POA: Diagnosis not present

## 2023-11-11 LAB — SYNOVIAL CELL COUNT + DIFF, W/ CRYSTALS
Crystals, Fluid: NONE SEEN
Monocyte-Macrophage-Synovial Fluid: 28 % — ABNORMAL LOW (ref 50–90)
Neutrophil, Synovial: 72 % — ABNORMAL HIGH (ref 0–25)
WBC, Synovial: 14600 /mm3 — ABNORMAL HIGH (ref 0–200)

## 2023-11-11 LAB — BASIC METABOLIC PANEL WITH GFR
Anion gap: 14 (ref 5–15)
BUN: 25 mg/dL — ABNORMAL HIGH (ref 8–23)
CO2: 21 mmol/L — ABNORMAL LOW (ref 22–32)
Calcium: 8.5 mg/dL — ABNORMAL LOW (ref 8.9–10.3)
Chloride: 99 mmol/L (ref 98–111)
Creatinine, Ser: 1.28 mg/dL — ABNORMAL HIGH (ref 0.44–1.00)
GFR, Estimated: 42 mL/min — ABNORMAL LOW (ref >60)
Glucose, Bld: 135 mg/dL — ABNORMAL HIGH (ref 70–99)
Potassium: 4 mmol/L (ref 3.5–5.1)
Sodium: 134 mmol/L — ABNORMAL LOW (ref 135–145)

## 2023-11-11 LAB — GLUCOSE, CAPILLARY
Glucose-Capillary: 159 mg/dL — ABNORMAL HIGH (ref 70–99)
Glucose-Capillary: 165 mg/dL — ABNORMAL HIGH (ref 70–99)
Glucose-Capillary: 137 mg/dL — ABNORMAL HIGH (ref 70–99)
Glucose-Capillary: 139 mg/dL — ABNORMAL HIGH (ref 70–99)

## 2023-11-11 LAB — CBC
HCT: 49.2 % — ABNORMAL HIGH (ref 36.0–46.0)
Hemoglobin: 15 g/dL (ref 12.0–15.0)
MCH: 30.7 pg (ref 26.0–34.0)
MCHC: 30.5 g/dL (ref 30.0–36.0)
MCV: 100.8 fL — ABNORMAL HIGH (ref 80.0–100.0)
Platelets: 169 10*3/uL (ref 150–400)
RBC: 4.88 MIL/uL (ref 3.87–5.11)
RDW: 16 % — ABNORMAL HIGH (ref 11.5–15.5)
WBC: 10.9 10*3/uL — ABNORMAL HIGH (ref 4.0–10.5)
nRBC: 0 % (ref 0.0–0.2)

## 2023-11-11 LAB — MAGNESIUM: Magnesium: 2.7 mg/dL — ABNORMAL HIGH (ref 1.7–2.4)

## 2023-11-11 MED ORDER — NYSTATIN 100000 UNIT/GM EX OINT
TOPICAL_OINTMENT | Freq: Two times a day (BID) | CUTANEOUS | Status: DC
  Administered 2023-11-13: 1 via TOPICAL
  Filled 2023-11-11: qty 15

## 2023-11-11 NOTE — Progress Notes (Signed)
 PROGRESS NOTE    Heidi Stark  ZHY:[id number] DOB: 1941/05/01 DOA: 11/09/2023 PCP: Estill Hemming, DO    Brief Narrative:   83 y.o. female with medical history significant of with past medical history of HTN, DM 2, HLD, P A-fib on Eliquis , splenic and renal infarcts, congestive heart failure with recovered ejection fraction 65%, hypothyroidism, history of VT comes to the hospital with complaints of right knee pain.  Patient diagnosed with left lower extremity cellulitis and has a right knee large effusion seen on CT scan.  Orthopedic follwing.  Patient was also started on IV Rocephin  for left lower extremity cellulitis.  Assessment & Plan:  Principal Problem:   Knee effusion, right Active Problems:   Cellulitis of left lower extremity   CKD (chronic kidney disease) stage 3, GFR 30-59 ml/min (HCC)   COPD (chronic obstructive pulmonary disease) (HCC)   Paroxysmal atrial fibrillation (HCC)   Type 2 diabetes mellitus with hyperlipidemia (HCC)   Essential hypertension   Persistent atrial fibrillation (HCC)    Left lower extremity cellulitis without purulent discharge - Does not appear to have any deeper infection.  Will continue IV Rocephin  for now and monitor her symptomatically.   Right knee pain with large effusion status post arthrocentesis/26 - Appears to be traumatic after her fall on her right knee.  CT does not show any evidence of fracture.  Knee immobilizer in place.  Orthopedic perform arthrocentesis which appeared to have elevated WBC and bloody.  No organisms seen.  Repeat arthrocentesis performed today   Paroxysmal atrial fibrillation - Patient is on amiodarone , Toprol -XL and Eliquis .  IV as needed   Congestive heart failure with preserved EF 65% - Appears to be euvolemic in nature at this time. - Continue current medication including Eliquis , Vascepa , Toprol -XL, Entresto , Farxiga .  Will resume eplerenone  upon discharge   Hyperlipidemia -vascepa    History of  VT status post ICD - Continue home amiodarone  and mexiletine   CKD stage IIIb -Creatinine currently at baseline of 1.5    COPD - As needed bronchodilators   Hypothyroidism - Synthroid    Essential hypertension -Resume home meds.  IV as needed   Obstructive sleep apnea -Will order bedtime CPAP   Diabetes mellitus type 2 -Sliding scale and Accu-Cheks   PT/OT-SNF  DVT prophylaxis: SCDs Start: 11/09/23 1529 apixaban  (ELIQUIS ) tablet 5 mg      Code Status: Full Code Family Communication:  Renee updated Status is: Inpatient Ongoing evaluation for right traumatic knee effusion    Subjective:  Overall feels better. Right knee aspirated again this morning by orthopedic team  Examination:  General exam: Appears calm and comfortable  Respiratory system: Clear to auscultation. Respiratory effort normal. Cardiovascular system: S1 & S2 heard, RRR. No JVD, murmurs, rubs, gallops or clicks. No pedal edema. Gastrointestinal system: Abdomen is nondistended, soft and nontender. No organomegaly or masses felt. Normal bowel sounds heard. Central nervous system: Alert and oriented. No focal neurological deficits. Extremities: Symmetric 5 x 5 power. Skin: Left lower extremity erythema has significantly improved.  Right lower extremity/knee dressing is in place Psychiatry: Judgement and insight appear normal. Mood & affect appropriate.                Diet Orders (From admission, onward)     Start     Ordered   11/09/23 1529  Diet heart healthy/carb modified Room service appropriate? Yes; Fluid consistency: Thin  Diet effective now       Question Answer Comment  Diet-HS Snack?  Nothing   Room service appropriate? Yes   Fluid consistency: Thin      11/09/23 1535            Objective: Vitals:   11/10/23 2253 11/11/23 0000 11/11/23 0411 11/11/23 0925  BP:   108/66 123/62  Pulse:   76 82  Resp: 19 16 18    Temp:   98.5 F (36.9 C)   TempSrc:      SpO2: 94%  94%    Weight:      Height:        Intake/Output Summary (Last 24 hours) at 11/11/2023 1115 Last data filed at 11/11/2023 0601 Gross per 24 hour  Intake 0 ml  Output 800 ml  Net -800 ml   Filed Weights   11/09/23 1202 11/09/23 1344 11/10/23 0506  Weight: 113.4 kg 114.5 kg 116.1 kg    Scheduled Meds:  amiodarone   200 mg Oral Daily   apixaban   5 mg Oral BID   dapagliflozin  propanediol  10 mg Oral Daily   docusate sodium   100 mg Oral BID   fenofibrate   54 mg Oral Daily   icosapent  Ethyl  2 g Oral BID   insulin  aspart  0-15 Units Subcutaneous TID WC   insulin  aspart  0-5 Units Subcutaneous QHS   levothyroxine   25 mcg Oral Q0600   metoprolol  succinate  25 mg Oral Daily   mexiletine  300 mg Oral BID   sacubitril -valsartan   1 tablet Oral BID   sertraline   25 mg Oral Daily   Continuous Infusions:  cefTRIAXone  (ROCEPHIN )  IV      Nutritional status     Body mass index is 41.31 kg/m.  Data Reviewed:   CBC: Recent Labs  Lab 11/08/23 1651 11/09/23 1253 11/10/23 0615 11/11/23 0740  WBC 9.7 12.8* 11.4* 10.9*  NEUTROABS 5.7 8.8*  --   --   HGB 15.5* 15.9* 14.6 15.0  HCT 47.3* 52.0* 45.9 49.2*  MCV 95.0 97.9 98.9 100.8*  PLT 171 190 156 169   Basic Metabolic Panel: Recent Labs  Lab 11/08/23 1651 11/09/23 1253 11/10/23 0615 11/11/23 0740  NA 137 137 135 134*  K 4.4 4.3 4.4 4.0  CL 97* 98 100 99  CO2 28 28 22  21*  GLUCOSE 85 106* 126* 135*  BUN 21 18 21  25*  CREATININE 1.28* 1.07* 1.25* 1.28*  CALCIUM  9.1 8.8* 8.2* 8.5*  MG  --   --  2.5* 2.7*  PHOS  --   --  3.9  --    GFR: Estimated Creatinine Clearance: 44.6 mL/min (A) (by C-G formula based on SCr of 1.28 mg/dL (H)). Liver Function Tests: Recent Labs  Lab 11/09/23 1253  AST 29  ALT 18  ALKPHOS 48  BILITOT 0.6  PROT 7.1  ALBUMIN 3.4*   No results for input(s): "LIPASE", "AMYLASE" in the last 168 hours. No results for input(s): "AMMONIA" in the last 168 hours. Coagulation Profile: No results for  input(s): "INR", "PROTIME" in the last 168 hours. Cardiac Enzymes: No results for input(s): "CKTOTAL", "CKMB", "CKMBINDEX", "TROPONINI" in the last 168 hours. BNP (last 3 results) No results for input(s): "PROBNP" in the last 8760 hours. HbA1C: No results for input(s): "HGBA1C" in the last 72 hours. CBG: Recent Labs  Lab 11/10/23 0729 11/10/23 1229 11/10/23 1658 11/10/23 2043 11/11/23 0741  GLUCAP 143* 129* 117* 160* 159*   Lipid Profile: No results for input(s): "CHOL", "HDL", "LDLCALC", "TRIG", "CHOLHDL", "LDLDIRECT" in the last 72 hours. Thyroid  Function Tests:  No results for input(s): "TSH", "T4TOTAL", "FREET4", "T3FREE", "THYROIDAB" in the last 72 hours. Anemia Panel: No results for input(s): "VITAMINB12", "FOLATE", "FERRITIN", "TIBC", "IRON", "RETICCTPCT" in the last 72 hours. Sepsis Labs: No results for input(s): "PROCALCITON", "LATICACIDVEN" in the last 168 hours.  Recent Results (from the past 240 hours)  Body fluid culture w Gram Stain     Status: None (Preliminary result)   Collection Time: 11/10/23  9:04 AM   Specimen: Synovium; Synovial Fluid  Result Value Ref Range Status   Specimen Description   Final    SYNOVIAL Performed at [facility], [address] [place], [place] [address]    Special Requests   Final    RIGHT KNEE Performed at [facility], [address], [place], [place] [address]    Gram Stain   Final    ABUNDANT WBC PRESENT, PREDOMINANTLY PMN NO ORGANISMS SEEN Performed at [facility], [address] [place], [place] [address]    Culture PENDING  Incomplete   Report Status PENDING  Incomplete         Radiology Studies: CT Knee Right Wo Contrast Result Date: 11/09/2023 CLINICAL DATA:  Knee pain.  Fell yesterday. EXAM: CT OF THE RIGHT KNEE WITHOUT CONTRAST TECHNIQUE: Multidetector CT imaging of the right knee was performed according to the standard protocol. Multiplanar CT image  reconstructions were also generated. RADIATION DOSE REDUCTION: This exam was performed according to the departmental dose-optimization program which includes automated exposure control, adjustment of the mA and/or kV according to patient size and/or use of iterative reconstruction technique. COMPARISON:  Radiographs 11/08/2023 FINDINGS: No acute fracture is identified. Age related osteoporosis and advanced degenerative joint disease with joint space narrowing, osteophytic spurring and subchondral cystic change. This is most significant in the lateral compartment with there is near bone-on-bone appearance. 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 Chondrocalcinosis is also present and can be seen with CPPD arthropathy. The knee musculature demonstrates age related fatty atrophy but no obvious muscle tear or intramuscular hematoma. Scattered vascular calcifications. No subcutaneous lesions. The quadriceps and patellar tendons are intact. IMPRESSION: 1. No acute fracture is identified. 2. Age related osteoporosis and advanced degenerative joint disease. 3. 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 4. Chondrocalcinosis is also present and can be seen with CPPD arthropathy. 5. Age related fatty atrophy of the knee musculature but no obvious muscle tear or intramuscular hematoma. Electronically Signed   By: Marrian Siva M.D.   On: 11/09/2023 13:57           LOS: 2 days   Time spent= 35 mins    Maggie Schooner, MD [facility]  If 7PM-7AM, please contact night-coverage  11/11/2023, 11:15 AM

## 2023-11-11 NOTE — Plan of Care (Signed)
  Problem: Fluid Volume: Goal: Ability to maintain a balanced intake and output will improve Outcome: Progressing   Problem: Metabolic: Goal: Ability to maintain appropriate glucose levels will improve Outcome: Progressing   Problem: Nutritional: Goal: Maintenance of adequate nutrition will improve Outcome: Progressing   Problem: Skin Integrity: Goal: Risk for impaired skin integrity will decrease Outcome: Progressing   Problem: Education: Goal: Knowledge of General Education information will improve Description: Including pain rating scale, medication(s)/side effects and non-pharmacologic comfort measures Outcome: Progressing   Problem: Clinical Measurements: Goal: Diagnostic test results will improve Outcome: Progressing   Problem: Nutrition: Goal: Adequate nutrition will be maintained Outcome: Progressing   Problem: Pain Managment: Goal: General experience of comfort will improve and/or be controlled Outcome: Progressing

## 2023-11-11 NOTE — Progress Notes (Signed)
 PATIENT ID: Heidi Stark  MRN: [id number]  DOB/AGE:  [birth year] / 83 y.o.      Subjective: Patient reports that the right knee feels slightly better than yesterday. States that she felt too weak to walk with therapy yesterday.   Objective: Vital signs in last 24 hours: Temp:  [98.1 F (36.7 C)-98.5 F (36.9 C)] 98.5 F (36.9 C) (04/27 0411) Pulse Rate:  [66-76] 76 (04/27 0411) Resp:  [16-19] 18 (04/27 0411) BP: (92-108)/(47-66) 108/66 (04/27 0411) SpO2:  [89 %-94 %] 94 % (04/27 0411) FiO2 (%):  [21 %] 21 % (04/26 2253)  Intake/Output from previous day: 04/26 0701 - 04/27 0700 In: 0  Out: 800 [Urine:800] Intake/Output this shift: No intake/output data recorded.  Recent Labs    11/08/23 1651 11/09/23 1253 11/10/23 0615 11/11/23 0740  HGB 15.5* 15.9* 14.6 15.0   Recent Labs    11/10/23 0615 11/11/23 0740  WBC 11.4* 10.9*  RBC 4.64 4.88  HCT 45.9 49.2*  PLT 156 169   Recent Labs    11/09/23 1253 11/10/23 0615  NA 137 135  K 4.3 4.4  CL 98 100  CO2 28 22  BUN 18 21  CREATININE 1.07* 1.25*  GLUCOSE 106* 126*  CALCIUM  8.8* 8.2*    Physical Exam: Neurologically intact Sensation intact distally Intact pulses distally Dorsiflexion/Plantar flexion intact No cellulitis present RLE Compartment soft Moderate effusion about right knee Pain with motion of the right knee  Assessment/Plan: Right knee effusion, likely hemarthrosis   Up with therapy as tolerated Weight Bearing as Tolerated (WBAT) VTE prophylaxis:  continue Eliquis  unless medicine prefers otherwise  Still awaiting cultures from aspirate drawn yesterday, but right knee effusion continues to appear to be related to hemarthrosis and not infection. Another aspiration was performed today given ongoing effusion ans 50cc of bloody fluid was aspirated. Per Dr Mcdonald Speller orders will send for repeat analysis of gram stain, cell count, cultures with sensitivities, and crystal analysis. Ortho will continue to  follow.     Taraya Steward L. [place], PA-C 11/11/2023, 9:03 AM

## 2023-11-11 NOTE — Evaluation (Signed)
 Occupational Therapy Evaluation Patient Details Name: Heidi Stark MRN: [id number] DOB: 04/12/1941 Today's Date: 11/11/2023   History of Present Illness   Patient is a 83 year old female who presented with R knee pain. Patient was admitted with R knee effusion and LLE cellulitis. Patient underwent R knee aspiration on 4/27.  AOZ:HYQM, HTN, a fib, CHF,COPD     Clinical Impressions Patient is a 83 year old female who was admitted for above. Patient was living with daughter and son in law prior level with independence in ADLs with family support for IADLs. Patient is unable to tolerate WB on BLE with limited to EOB at this time. Patients BP remained stable as noted in vitals section. Patient was noted to have decreased functional activity tolerance, decreased endurance, decreased standing balance, decreased safety awareness, and decreased knowledge of AD/AE impacting participation in ADLs. Patient will benefit from continued inpatient follow up therapy, <3 hours/day.      If plan is discharge home, recommend the following:   Two people to help with walking and/or transfers;A lot of help with bathing/dressing/bathroom;Assistance with cooking/housework;Direct supervision/assist for medications management;Assist for transportation;Help with stairs or ramp for entrance;Direct supervision/assist for financial management     Functional Status Assessment   Patient has had a recent decline in their functional status and demonstrates the ability to make significant improvements in function in a reasonable and predictable amount of time.     Equipment Recommendations   None recommended by OT      Precautions/Restrictions   Precautions Precautions: Fall Recall of Precautions/Restrictions: Intact Required Braces or Orthoses: Knee Immobilizer - Right (for comfort) Restrictions Weight Bearing Restrictions Per Provider Order: Yes RLE Weight Bearing Per Provider Order: Weight bearing as  tolerated     Mobility Bed Mobility Overal bed mobility: Needs Assistance Bed Mobility: Supine to Sit, Sit to Supine     Supine to sit: Mod assist Sit to supine: +2 for safety/equipment, +2 for physical assistance, Total assist   General bed mobility comments: with increased time and cues for proper positioning. KI was in place per patient request. daughter present at end of session.           Balance Overall balance assessment: Needs assistance Sitting-balance support: Feet supported, Single extremity supported Sitting balance-Leahy Scale: Poor         Standing balance comment: unable         ADL either performed or assessed with clinical judgement   ADL Overall ADL's : Needs assistance/impaired Eating/Feeding: Modified independent;Sitting   Grooming: Sitting;Wash/dry hands;Brushing hair Grooming Details (indicate cue type and reason): sitting EOB Upper Body Bathing: Supervision/ safety;Sitting   Lower Body Bathing: Sitting/lateral leans;Total assistance   Upper Body Dressing : Sitting;Supervision/safety   Lower Body Dressing: Sitting/lateral leans;Total assistance     Toilet Transfer Details (indicate cue type and reason): unable to progress into standing at this time. unable to clear bottom off bed with no ability to engage in scoot transfer at this time either. Toileting- Clothing Manipulation and Hygiene: Bed level;Total assistance               Vision Baseline Vision/History: 1 Wears glasses Vision Assessment?: Wears glasses for reading            Pertinent Vitals/Pain Pain Assessment Pain Assessment: 0-10 Pain Score: 8  Pain Location: R knee/hip Pain Descriptors / Indicators: Aching, Constant, Grimacing, Discomfort, Nagging Pain Intervention(s): Limited activity within patient's tolerance, Monitored during session, Repositioned     Extremity/Trunk Assessment Upper  Extremity Assessment Upper Extremity Assessment: Overall WFL for tasks  assessed   Lower Extremity Assessment Lower Extremity Assessment: Defer to PT evaluation   Cervical / Trunk Assessment Cervical / Trunk Assessment: Kyphotic      Cognition Arousal: Lethargic Behavior During Therapy: WFL for tasks assessed/performed Cognition: No apparent impairments           Following commands: Intact                  Home Living Family/patient expects to be discharged to:: Private residence Living Arrangements: Children Available Help at Discharge: Family;Available 24 hours/day Type of Home: Other(Comment) (townhouse) Home Access: Stairs to enter [employer] of Steps: 3 (reported they are planning to build a ramp) Entrance Stairs-Rails: None Home Layout: One level     Bathroom Shower/Tub: Producer, television/film/video: Handicapped height     Home Equipment: Agricultural consultant (2 wheels);Rollator (4 wheels);Electric scooter;BSC/3in1;Lift chair;Grab bars - toilet          Prior Functioning/Environment Prior Level of Function : Needs assist       Physical Assist : ADLs (physical)   ADLs (physical): Toileting;Bathing Mobility Comments: Generally walks with 4WRW, scooter for community mobility ADLs Comments: Spouse passed around labor day, daughter stays with her, assists with iADL, community mobility.  Patient continues to complete her own ADL    OT Problem List: Impaired balance (sitting and/or standing);Decreased safety awareness;Decreased activity tolerance;Decreased knowledge of use of DME or AE;Obesity;Pain   OT Treatment/Interventions: Self-care/ADL training;DME and/or AE instruction;Therapeutic activities;Balance training;Therapeutic exercise;Energy conservation;Patient/family education      OT Goals(Current goals can be found in the care plan section)   Acute Rehab OT Goals Patient Stated Goal: to go back home OT Goal Formulation: With patient/family Time For Goal Achievement: 11/25/23 Potential to Achieve Goals:  Fair   OT Frequency:  Min 2X/week       AM-PAC OT "6 Clicks" Daily Activity     Outcome Measure Help from another person eating meals?: A Little Help from another person taking care of personal grooming?: A Little Help from another person toileting, which includes using toliet, bedpan, or urinal?: Total Help from another person bathing (including washing, rinsing, drying)?: A Lot Help from another person to put on and taking off regular upper body clothing?: A Little Help from another person to put on and taking off regular lower body clothing?: A Lot 6 Click Score: 14   End of Session Equipment Utilized During Treatment: Right knee immobilizer  Activity Tolerance: Patient limited by pain Patient left: in bed;with call bell/phone within reach;with family/visitor present (nurse in room)  OT Visit Diagnosis: Unsteadiness on feet (R26.81);Other abnormalities of gait and mobility (R26.89);Pain                Time: 1015-1104 OT Time Calculation (min): 49 min Charges:  OT General Charges $OT Visit: 1 Visit OT Evaluation $OT Eval Low Complexity: 1 Low OT Treatments $Self Care/Home Management : 23-37 mins  Amilio Zehnder OTR/L, MS Acute Rehabilitation Department Office# [phone]   Heidi Stark 11/11/2023, 1:03 PM

## 2023-11-11 NOTE — Procedures (Signed)
 Aspiration/Injection Procedure Note Heidi Stark [id number] 06-Apr-1941  Procedure: Aspiration Indications: right knee effusion  Procedure Details Consent: Risks of procedure as well as the alternatives and risks of each were explained to the (patient/caregiver).  Consent for procedure obtained.  Local Anesthesia Used:Lidocaine  1% plain; 5mL Amount of Fluid Aspirated:  50mL Character of Fluid: bloody Fluid was sent WUJ:WJXB count, culture, crystal identification, and gram stain A sterile dressing was applied.  Patient did tolerate procedure well. Estimated blood loss: 50  Heidi Tsukamoto L. [place], PA-C 11/11/2023, 9:13 AM

## 2023-11-11 NOTE — Progress Notes (Signed)
   11/11/23 1625  TOC Brief Assessment  Insurance and Status Reviewed  Patient has primary care physician Yes  Home environment has been reviewed From home, daughter contact  Prior level of function: Need Assistance/ Cardiac care  Prior/Current Home Services No current home services  Social Drivers of Health Review SDOH reviewed no interventions necessary  Readmission risk has been reviewed Yes  Transition of care needs transition of care needs identified, TOC will continue to follow (SNF recommended)   CSW visited pt today and she was in process of therapy. During second visit to assess, and discuss PT recommendation, pt was sound asleep.  CSW then contacted daughter to gather social history, no answer- left a message on her VM. TOC to continue to follow.

## 2023-11-11 NOTE — Progress Notes (Signed)
   11/11/23 2311  BiPAP/CPAP/SIPAP  Reason BIPAP/CPAP not in use  (PATIENT STATED SHE WILL PUT IT ON LATER IF SHE WANTS TO WEAR IT.)  BiPAP/CPAP /SiPAP Vitals  Resp (!) 24  MEWS Score/Color  MEWS Score 1  MEWS Score Color Green

## 2023-11-12 DIAGNOSIS — M25461 Effusion, right knee: Secondary | ICD-10-CM | POA: Diagnosis not present

## 2023-11-12 LAB — BASIC METABOLIC PANEL WITH GFR
Anion gap: 11 (ref 5–15)
BUN: 27 mg/dL — ABNORMAL HIGH (ref 8–23)
CO2: 24 mmol/L (ref 22–32)
Calcium: 8.4 mg/dL — ABNORMAL LOW (ref 8.9–10.3)
Chloride: 99 mmol/L (ref 98–111)
Creatinine, Ser: 1.08 mg/dL — ABNORMAL HIGH (ref 0.44–1.00)
GFR, Estimated: 52 mL/min — ABNORMAL LOW (ref >60)
Glucose, Bld: 140 mg/dL — ABNORMAL HIGH (ref 70–99)
Potassium: 4.3 mmol/L (ref 3.5–5.1)
Sodium: 134 mmol/L — ABNORMAL LOW (ref 135–145)

## 2023-11-12 LAB — CBC
HCT: 45.5 % (ref 36.0–46.0)
Hemoglobin: 14.3 g/dL (ref 12.0–15.0)
MCH: 30.9 pg (ref 26.0–34.0)
MCHC: 31.4 g/dL (ref 30.0–36.0)
MCV: 98.3 fL (ref 80.0–100.0)
Platelets: 182 10*3/uL (ref 150–400)
RBC: 4.63 MIL/uL (ref 3.87–5.11)
RDW: 15.9 % — ABNORMAL HIGH (ref 11.5–15.5)
WBC: 10 10*3/uL (ref 4.0–10.5)
nRBC: 0 % (ref 0.0–0.2)

## 2023-11-12 LAB — GLUCOSE, CAPILLARY
Glucose-Capillary: 164 mg/dL — ABNORMAL HIGH (ref 70–99)
Glucose-Capillary: 130 mg/dL — ABNORMAL HIGH (ref 70–99)
Glucose-Capillary: 151 mg/dL — ABNORMAL HIGH (ref 70–99)
Glucose-Capillary: 132 mg/dL — ABNORMAL HIGH (ref 70–99)

## 2023-11-12 LAB — MAGNESIUM: Magnesium: 2.5 mg/dL — ABNORMAL HIGH (ref 1.7–2.4)

## 2023-11-12 NOTE — Plan of Care (Signed)
  Problem: Clinical Measurements: Goal: Cardiovascular complication will be avoided Outcome: Progressing   Problem: Activity: Goal: Risk for activity intolerance will decrease Outcome: Progressing   Problem: Elimination: Goal: Will not experience complications related to bowel motility Outcome: Progressing   Problem: Safety: Goal: Ability to remain free from injury will improve Outcome: Progressing   Problem: Skin Integrity: Goal: Risk for impaired skin integrity will decrease Outcome: Progressing   Problem: Skin Integrity: Goal: Skin integrity will improve Outcome: Progressing

## 2023-11-12 NOTE — TOC Initial Note (Addendum)
 Transition of Care [facility]) - Initial/Assessment Note    Patient Details  Name: Heidi Stark MRN: [id number] Date of Birth: 1941/04/21  Transition of [facility]) CM/SW Contact:    Jonni Nettle, LCSW Phone Number: 11/12/2023, 10:14 AM  Clinical Narrative:                 CSW spoke with pt regarding recommendation for short-term SNF upon hospital discharge. Pt is agreeable to SNF placement. CSW explained referral process. Pt reports [facility] is closest to her home. CSW completed FL2, sent referral to [facility] and other facilities in [place]. Per pt's request, CSW attempted to inform pt's daughter, Adel Aden, of discharge plan. No answer, voicemail left. CSW will continue to follow for SNF placement.  ADDENDUM  CSW spoke with pt's daughter, Adel Aden, via phone call. Daughter is agreeable with plan for SNF. CSW to go over bed choices tomorrow at 1:15pm with pt and daughter.   Expected Discharge Plan: Skilled Nursing Facility Barriers to Discharge: Insurance Authorization, SNF Pending bed offer, Continued Medical Work up   Patient Goals and CMS Choice Patient states their goals for this hospitalization and ongoing recovery are:: To discharge to SNF for short-term rehab CMS Medicare.gov Compare Post Acute Care list provided to:: Patient Choice offered to / list presented to : Patient [facility] ownership interest in [facility].provided to:: Patient    Expected Discharge Plan and Services In-house Referral: Clinical Social Work Discharge Planning Services: NA Post Acute Care Choice: Skilled Nursing Facility Living arrangements for the past 2 months: Single Family Home  Prior Living Arrangements/Services Living arrangements for the past 2 months: Single Family Home Lives with:: Self Patient language and need for interpreter reviewed:: Yes Do you feel safe going back to the place where you live?: Yes      Need for Family Participation in Patient Care: Yes  (Comment) Care giver support system in place?: Yes (comment) Current home services: DME Criminal Activity/Legal Involvement Pertinent to Current Situation/Hospitalization: No - Comment as needed  Activities of Daily Living   ADL Screening (condition at time of admission) Independently performs ADLs?: Yes (appropriate for developmental age) Is the patient deaf or have difficulty hearing?: No Does the patient have difficulty seeing, even when wearing glasses/contacts?: No Does the patient have difficulty concentrating, remembering, or making decisions?: No  Permission Sought/Granted Permission sought to share information with : Family Supports, Oceanographer granted to share information with : Yes, Verbal Permission Granted  Share Information with NAME: Renee Mansir  Permission granted to share info w AGENCY: SNF  Permission granted to share info w Relationship: daughter  Permission granted to share info w Contact Information: daughter, Adel Aden  Emotional Assessment Appearance:: Appears stated age Attitude/Demeanor/Rapport: Engaged Affect (typically observed): Accepting, Appropriate, Calm, Stable, Pleasant Orientation: : Oriented to Self, Oriented to Place, Oriented to  Time, Oriented to Situation Alcohol / Substance Use: Not Applicable Psych Involvement: No (comment)  Admission diagnosis:  Hypovolemia [E86.1] Knee effusion, right [M25.461] Cellulitis of left lower extremity [L03.116] Traumatic effusion of knee joint [M25.469] Patient Active Problem List   Diagnosis Date Noted   Knee effusion, right 11/09/2023   Type 2 diabetes mellitus with hyperlipidemia (HCC) 05/16/2023   Hypothyroidism 05/16/2023   Cellulitis of left lower extremity 05/13/2023   Compression fracture of lumbar spine, non-traumatic, sequela 05/13/2023   Acute on chronic diastolic CHF (congestive heart failure) (HCC) 05/13/2023   Diabetic nephropathy (HCC) 10/13/2021    Hyperglycemia due  to type 2 diabetes mellitus (HCC) 10/13/2021   Proteinuria 10/13/2021   Pure hypercholesterolemia 10/13/2021   COVID-19 04/15/2021   Respiratory failure with hypoxia (HCC) 04/15/2021   Systolic CHF (HCC) 04/15/2021   CKD (chronic kidney disease) stage 3, GFR 30-59 ml/min (HCC) 04/15/2021   Elevated troponin 04/15/2021   Acute on chronic systolic (congestive) heart failure (HCC) 09/14/2020   Wide-complex tachycardia 09/07/2020   Ventricular tachyarrhythmia (HCC) 09/07/2020   VT (ventricular tachycardia) (HCC) 09/07/2020   Persistent atrial fibrillation (HCC) 04/30/2018   Wound of left leg 10/01/2017   Cellulitis of right lower extremity 11/27/2016   Encounter for assessment for deep vein thrombosis (DVT) 11/14/2016   Hypokalemia 08/13/2016   Type 2 diabetes mellitus with hyperglycemia, with long-term current use of insulin  (HCC)    Atherosclerosis of aorta (HCC) 06/26/2016   Colitis 11/24/2014   Severe obesity (BMI >= 40) (HCC) 11/12/2013   Paroxysmal atrial fibrillation (HCC) 11/03/2013   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 [place] [address] Phone: [phone] Fax: [phone]  [facility] [address] [place] [address] Phone: [phone] Fax: [phone]  [id number] [place], [place] - [address] [place] [address] Phone: [phone] Fax: [phone]   Social Drivers of  Health (SDOH) Social History: SDOH Screenings   Food Insecurity: No Food Insecurity (11/09/2023)  Housing: Low Risk  (11/09/2023)  Transportation Needs: No Transportation Needs (11/09/2023)  Utilities: Not At Risk (11/09/2023)  Alcohol Screen: Low Risk  (10/13/2021)  Depression (PHQ2-9): Low Risk  (09/14/2023)  Financial Resource Strain: Medium Risk (10/13/2021)  Physical Activity: Insufficiently Active (10/13/2021)  Social Connections: Moderately Integrated (11/09/2023)  Stress: No Stress Concern Present (10/13/2021)  Tobacco Use: Medium Risk (11/09/2023)   SDOH Interventions: None needed at this time.    Readmission Risk Interventions    11/12/2023   10:09 AM  Readmission Risk Prevention Plan  Transportation Screening Complete  PCP or Specialist Appt within 3-5 Days Complete  HRI or Home Care Consult Complete  Social Work Consult for Recovery Care Planning/Counseling Complete  Palliative Care Screening Not Applicable  Medication Review Banker Care Manager) Complete   [place], LCSW 11/12/2023 10:51 AM

## 2023-11-12 NOTE — Progress Notes (Signed)
 PATIENT ID: Heidi Stark  MRN: [id number]  DOB/AGE:  11/21/1940 / 83 y.o.        PROGRESS NOTE Subjective:   Patient is alert, oriented, no Nausea, no Vomiting, yes passing gas, no Bowel Movement. Taking PO well with pt having eaten breakfast. Denies SOB, Chest or Calf Pain. Using Incentive Spirometer, PAS in place. Ambulate WBAT, Patient reports pain as improving.  Objective: Vital signs in last 24 hours: Temp:  [97.8 F (36.6 C)-98.4 F (36.9 C)] 97.8 F (36.6 C) (04/28 0422) Pulse Rate:  [58-66] 66 (04/28 0422) Resp:  [17-24] 17 (04/28 0422) BP: (101-117)/(50-58) 113/50 (04/28 1030) SpO2:  [93 %-95 %] 95 % (04/28 0422) Weight:  [117.6 kg] 117.6 kg (04/28 0500)    Intake/Output from previous day: I/O last 3 completed shifts: In: -  Out: 2350 [Urine:2350]   Intake/Output this shift: No intake/output data recorded.   LABORATORY DATA: Recent Labs    11/11/23 0740 11/11/23 0741 11/11/23 2041 11/12/23 0529 11/12/23 0809 11/12/23 1210  WBC 10.9*  --   --  10.0  --   --   HGB 15.0  --   --  14.3  --   --   HCT 49.2*  --   --  45.5  --   --   PLT 169  --   --  182  --   --   NA 134*  --   --  134*  --   --   K 4.0  --   --  4.3  --   --   CL 99  --   --  99  --   --   CO2 21*  --   --  24  --   --   BUN 25*  --   --  27*  --   --   CREATININE 1.28*  --   --  1.08*  --   --   GLUCOSE 135*  --   --  140*  --   --   GLUCAP  --    < > 139*  --  164* 130*  CALCIUM  8.5*  --   --  8.4*  --   --    < > = values in this interval not displayed.    Examination: Neurologically intact Sensation intact distally Intact pulses distally Dorsiflexion/Plantar flexion intact} Cellulitis has improved in left lower extremity.  Right knee has minimal effusion.  Irritable to ROM. Assessment:    Right Knee effusion with likely hemarthrosis  ADDITIONAL DIAGNOSIS:  Diabetes, Renal Insufficiency Chronic, Sleep Apnea, and CHF, Afib,HTN  Plan:  Weight Bearing as Tolerated (WBAT), Up with  therapy  DVT Prophylaxis:   Eliquis   DISCHARGE PLAN: Skilled Nursing Facility/Rehab  DISCHARGE NEEDS: HHPT, Walker, and 3-in-1 comode seat     Gibson Kurtz 11/12/2023, 12:24 PM

## 2023-11-12 NOTE — Progress Notes (Signed)
 Mobility Specialist - Progress Note   11/12/23 1500  Mobility  Activity Transferred from chair to bed  Level of Assistance Other (Comment) (lift)  Assistive Device MaxiMove  Activity Response Tolerated well  Mobility visit 1 Mobility  Mobility Specialist Start Time (ACUTE ONLY) 1445  Mobility Specialist Stop Time (ACUTE ONLY) 1500  Mobility Specialist Time Calculation (min) (ACUTE ONLY) 15 min   Nurse asking for assistance in transfer, pt could not stand up from recliner. Placed lift pad underneath and lifted pt back to bed using Maximove.   No issues and left in bed with all needs met. Staff in room.  Ivonne Marry Mobility Specialist

## 2023-11-12 NOTE — [place] FL2 (Signed)
   MEDICAID [id number] LEVEL OF CARE FORM     IDENTIFICATION  Patient Name: Heidi Stark Birthdate: 07/10/1941 Sex: female Admission Date (Current Location): 11/09/2023  [facility] and [place] Number:  Producer, television/film/video and Address:  [facility],  [address] [place], [place] [address]      Provider Number: [id number]  Attending Physician Name and Address:  Maggie Schooner, MD  Relative Name and Phone Number:  Adel Aden' (Daughter)  [phone]    Current Level of Care: Hospital Recommended Level of Care: Skilled Nursing Facility Prior Approval Number:    Date Approved/Denied:   PASRR Number: [id number]  Discharge Plan: SNF    Current Diagnoses: Patient Active Problem List   Diagnosis Date Noted   Knee effusion, right 11/09/2023   Type 2 diabetes mellitus with hyperlipidemia (HCC) 05/16/2023   Hypothyroidism 05/16/2023   Cellulitis of left lower extremity 05/13/2023   Compression fracture of lumbar spine, non-traumatic, sequela 05/13/2023   Acute on chronic diastolic CHF (congestive heart failure) (HCC) 05/13/2023   Diabetic nephropathy (HCC) 10/13/2021   Hyperglycemia due to type 2 diabetes mellitus (HCC) 10/13/2021   Proteinuria 10/13/2021   Pure hypercholesterolemia 10/13/2021   COVID-19 04/15/2021   Respiratory failure with hypoxia (HCC) 04/15/2021   Systolic CHF (HCC) 04/15/2021   CKD (chronic kidney disease) stage 3, GFR 30-59 ml/min (HCC) 04/15/2021   Elevated troponin 04/15/2021   Acute on chronic systolic (congestive) heart failure (HCC) 09/14/2020   Wide-complex tachycardia 09/07/2020   Ventricular tachyarrhythmia (HCC) 09/07/2020   VT (ventricular tachycardia) (HCC) 09/07/2020   Persistent atrial fibrillation (HCC) 04/30/2018   Wound of left leg 10/01/2017   Cellulitis of right lower extremity 11/27/2016   Encounter for assessment for deep vein thrombosis (DVT) 11/14/2016   Hypokalemia 08/13/2016   Type 2 diabetes mellitus with  hyperglycemia, with long-term current use of insulin  (HCC)    Atherosclerosis of aorta (HCC) 06/26/2016   Colitis 11/24/2014   Severe obesity (BMI >= 40) (HCC) 11/12/2013   Paroxysmal atrial fibrillation (HCC) 11/03/2013   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 10/28/2013   COLONIC POLYPS 08/16/2010   PAD (peripheral artery disease) (HCC) 08/15/2010   HLD (hyperlipidemia) 11/18/2009   MYALGIA 11/18/2009   OBESITY 05/14/2007   PULMONARY NODULE, RIGHT MIDDLE LOBE 05/14/2007   Obstructive sleep apnea 12/06/2006   Essential hypertension 12/06/2006   COPD (chronic obstructive pulmonary disease) (HCC) 12/06/2006    Orientation RESPIRATION BLADDER Height & Weight     Self, Time, Situation, Place  Normal Continent Weight: 259 lb 4.2 oz (117.6 kg) Height:  5\' 6"  (167.6 cm)  BEHAVIORAL SYMPTOMS/MOOD NEUROLOGICAL BOWEL NUTRITION STATUS      Continent Diet (heart healthy/carb motified)  AMBULATORY STATUS COMMUNICATION OF NEEDS Skin   Extensive Assist Verbally Other (Comment) (Non-pressure wound Ankle Anterior;Left)                       Personal Care Assistance Level of Assistance  Bathing, Feeding, Dressing Bathing Assistance: Maximum assistance Feeding assistance: Independent Dressing Assistance: Maximum assistance     Functional Limitations Info  Sight, Hearing, Speech Sight Info: Impaired (eye glasses) Hearing Info: Impaired Speech Info: Adequate    SPECIAL CARE FACTORS FREQUENCY  PT (By licensed PT), OT (By licensed OT)     PT Frequency: 5x per week OT Frequency: 5x per week            Contractures Contractures Info: Not present  Additional Factors Info  Code Status, Allergies Code Status Info: FULL Allergies Info: Neomycin-bacitracin Zn-polymyx, Sulfa Antibiotics, Ciprofloxacin , Spironolactone            Current Medications (11/12/2023):  This is the current hospital active medication list Current Facility-Administered  Medications  Medication Dose Route Frequency Provider Last Rate Last Admin   acetaminophen  (TYLENOL ) tablet 650 mg  650 mg Oral Q6H PRN Amin, Ankit C, MD   650 mg at 11/11/23 1439   Or   acetaminophen  (TYLENOL ) suppository 650 mg  650 mg Rectal Q6H PRN Amin, Ankit C, MD       amiodarone  (PACERONE ) tablet 200 mg  200 mg Oral Daily Amin, Ankit C, MD   200 mg at 11/12/23 4098   apixaban  (ELIQUIS ) tablet 5 mg  5 mg Oral BID Amin, Ankit C, MD   5 mg at 11/12/23 1191   bisacodyl  (DULCOLAX) EC tablet 5 mg  5 mg Oral Daily PRN Amin, Ankit C, MD   5 mg at 11/09/23 1726   cefTRIAXone  (ROCEPHIN ) 2 g in sodium chloride  0.9 % 100 mL IVPB  2 g Intravenous Q24H Amin, Ankit C, MD 200 mL/hr at 11/11/23 1523 2 g at 11/11/23 1523   dapagliflozin  propanediol (FARXIGA ) tablet 10 mg  10 mg Oral Daily Amin, Ankit C, MD   10 mg at 11/12/23 4782   docusate sodium  (COLACE) capsule 100 mg  100 mg Oral BID Amin, Ankit C, MD   100 mg at 11/12/23 9562   fenofibrate  tablet 54 mg  54 mg Oral Daily Amin, Ankit C, MD   54 mg at 11/12/23 1308   gabapentin  (NEURONTIN ) capsule 300 mg  300 mg Oral Daily PRN Amin, Ankit C, MD   300 mg at 11/11/23 1439   guaiFENesin  (MUCINEX ) 12 hr tablet 600 mg  600 mg Oral BID PRN Amin, Ankit C, MD   600 mg at 11/09/23 2312   hydrALAZINE  (APRESOLINE ) injection 10 mg  10 mg Intravenous Q4H PRN Amin, Ankit C, MD       HYDROmorphone  (DILAUDID ) injection 0.5-1 mg  0.5-1 mg Intravenous Q2H PRN Amin, Ankit C, MD   1 mg at 11/10/23 0910   icosapent  Ethyl (VASCEPA ) 1 g capsule 2 g  2 g Oral BID Amin, Ankit C, MD   2 g at 11/12/23 6578   insulin  aspart (novoLOG ) injection 0-15 Units  0-15 Units Subcutaneous TID WC Amin, Ankit C, MD   3 Units at 11/12/23 0829   insulin  aspart (novoLOG ) injection 0-5 Units  0-5 Units Subcutaneous QHS Amin, Ankit C, MD       ipratropium-albuterol  (DUONEB) 0.5-2.5 (3) MG/3ML nebulizer solution 3 mL  3 mL Nebulization Q4H PRN Amin, Ankit C, MD       levothyroxine  (SYNTHROID )  tablet 25 mcg  25 mcg Oral Q0600 Amin, Ankit C, MD   25 mcg at 11/12/23 4696   LORazepam  (ATIVAN ) tablet 0.5 mg  0.5 mg Oral QHS PRN Amin, Ankit C, MD       methocarbamol (ROBAXIN) injection 500 mg  500 mg Intravenous Q6H PRN Amin, Ankit C, MD   500 mg at 11/11/23 1440   metoprolol  succinate (TOPROL -XL) 24 hr tablet 25 mg  25 mg Oral Daily Amin, Ankit C, MD   25 mg at 11/11/23 0925   metoprolol  tartrate (LOPRESSOR ) injection 10 mg  10 mg Intravenous Q4H PRN Amin, Ankit C, MD       metoprolol  tartrate (LOPRESSOR ) injection 5 mg  5 mg Intravenous Q4H PRN  Amin, Ankit C, MD       mexiletine (MEXITIL ) capsule 300 mg  300 mg Oral BID Amin, Ankit C, MD   300 mg at 11/12/23 1610   nicotine  (NICODERM CQ  - dosed in mg/24 hours) patch 21 mg  21 mg Transdermal Daily PRN Amin, Ankit C, MD       nystatin ointment (MYCOSTATIN)   Topical BID Amin, Ankit C, MD   Given at 11/12/23 9604   ondansetron  (ZOFRAN ) injection 4 mg  4 mg Intravenous Q6H PRN Amin, Ankit C, MD   4 mg at 11/11/23 1444   Oral care mouth rinse  15 mL Mouth Rinse PRN Amin, Ankit C, MD       oxyCODONE  (Oxy IR/ROXICODONE ) immediate release tablet 5 mg  5 mg Oral Q4H PRN Amin, Ankit C, MD   5 mg at 11/11/23 1400   sacubitril -valsartan  (ENTRESTO ) 24-26 mg per tablet  1 tablet Oral BID Amin, Ankit C, MD   1 tablet at 11/12/23 5409   senna-docusate (Senokot-S) tablet 1 tablet  1 tablet Oral QHS PRN Amin, Ankit C, MD       sertraline  (ZOLOFT ) tablet 25 mg  25 mg Oral Daily Amin, Ankit C, MD   25 mg at 11/12/23 8119   sodium phosphate  (FLEET) enema 1 enema  1 enema Rectal Once PRN Amin, Ankit C, MD       traZODone (DESYREL) tablet 50 mg  50 mg Oral QHS PRN Amin, Ankit C, MD         Discharge Medications: Please see discharge summary for a list of discharge medications.  Relevant Imaging Results:  Relevant Lab Results:   Additional Information SSN: [id number]  Melinda Sprawls Alonzo Loving, LCSW

## 2023-11-12 NOTE — Progress Notes (Signed)
 PROGRESS NOTE    Heidi Stark  VWU:[id number] DOB: 09-29-1940 DOA: 11/09/2023 PCP: Estill Hemming, DO    Brief Narrative:   83 y.o. female with medical history significant of with past medical history of HTN, DM 2, HLD, P A-fib on Eliquis , splenic and renal infarcts, congestive heart failure with recovered ejection fraction 65%, hypothyroidism, history of VT comes to the hospital with complaints of right knee pain.  Patient diagnosed with left lower extremity cellulitis and has a right knee large effusion seen on CT scan.  Orthopedic follwing.  Patient was also started on IV Rocephin  for left lower extremity cellulitis.  Patient had arthrocentesis of the right knee performed on 4/26 and 4/27 both were bloody but WBCs improving, no organisms seen.  Assessment & Plan:  Principal Problem:   Knee effusion, right Active Problems:   Cellulitis of left lower extremity   CKD (chronic kidney disease) stage 3, GFR 30-59 ml/min (HCC)   COPD (chronic obstructive pulmonary disease) (HCC)   Paroxysmal atrial fibrillation (HCC)   Type 2 diabetes mellitus with hyperlipidemia (HCC)   Essential hypertension   Persistent atrial fibrillation (HCC)    Left lower extremity cellulitis without purulent discharge - Does not appear to have any deeper infection.  Significantly improving on IV Rocephin .   Right knee pain with large effusion status post arthrocentesis/26 - Appears to be traumatic after her fall on her right knee.  CT does not show any evidence of fracture.  Knee immobilizer in place.  Orthopedic perform arthrocentesis which appeared to have elevated WBC and bloody.  And repeat arthrocentesis shows improvement in WBC but still appears bloody.  No organisms seen on either cultures.   Paroxysmal atrial fibrillation - Patient is on amiodarone , Toprol -XL and Eliquis .  IV as needed   Congestive heart failure with preserved EF 65% - Appears to be euvolemic in nature at this time. - Continue  current medication including Eliquis , Vascepa , Toprol -XL, Entresto , Farxiga .  Will resume eplerenone  upon discharge   Hyperlipidemia -vascepa    History of VT status post ICD - Continue home amiodarone  and mexiletine   CKD stage IIIb -Creatinine currently at baseline of 1.5    COPD - As needed bronchodilators   Hypothyroidism - Synthroid    Essential hypertension -Resume home meds.  IV as needed   Obstructive sleep apnea -Will order bedtime CPAP   Diabetes mellitus type 2 -Sliding scale and Accu-Cheks   PT/OT-SNF, TOC consulted  DVT prophylaxis: SCDs Start: 11/09/23 1529 apixaban  (ELIQUIS ) tablet 5 mg      Code Status: Full Code Family Communication:  Renee updated Status is: Inpatient Will need SNF placement Awaiting final orthopedic recommendations.  Subjective: Still having right lower extremity knee pain Left lower extremity cellulitis has significantly improved and feels better.  Examination:  General exam: Appears calm and comfortable  Respiratory system: Clear to auscultation. Respiratory effort normal. Cardiovascular system: S1 & S2 heard, RRR. No JVD, murmurs, rubs, gallops or clicks. No pedal edema. Gastrointestinal system: Abdomen is nondistended, soft and nontender. No organomegaly or masses felt. Normal bowel sounds heard. Central nervous system: Alert and oriented. No focal neurological deficits. Extremities: Symmetric 5 x 5 power. Skin: Left lower extremity erythema has significantly improved.  Right lower extremity/knee dressing is in place Psychiatry: Judgement and insight appear normal. Mood & affect appropriate.                Diet Orders (From admission, onward)     Start     Ordered  11/09/23 1529  Diet heart healthy/carb modified Room service appropriate? Yes; Fluid consistency: Thin  Diet effective now       Question Answer Comment  Diet-HS Snack? Nothing   Room service appropriate? Yes   Fluid consistency: Thin       11/09/23 1535            Objective: Vitals:   11/12/23 0422 11/12/23 0500 11/12/23 0837 11/12/23 1030  BP: (!) 116/58  (!) 117/55 (!) 113/50  Pulse: 66     Resp: 17     Temp: 97.8 F (36.6 C)     TempSrc:      SpO2: 95%     Weight:  117.6 kg    Height:        Intake/Output Summary (Last 24 hours) at 11/12/2023 1035 Last data filed at 11/12/2023 0100 Gross per 24 hour  Intake --  Output 1550 ml  Net -1550 ml   Filed Weights   11/09/23 1344 11/10/23 0506 11/12/23 0500  Weight: 114.5 kg 116.1 kg 117.6 kg    Scheduled Meds:  amiodarone   200 mg Oral Daily   apixaban   5 mg Oral BID   dapagliflozin  propanediol  10 mg Oral Daily   docusate sodium   100 mg Oral BID   fenofibrate   54 mg Oral Daily   icosapent  Ethyl  2 g Oral BID   insulin  aspart  0-15 Units Subcutaneous TID WC   insulin  aspart  0-5 Units Subcutaneous QHS   levothyroxine   25 mcg Oral Q0600   metoprolol  succinate  25 mg Oral Daily   mexiletine  300 mg Oral BID   nystatin ointment   Topical BID   sacubitril -valsartan   1 tablet Oral BID   sertraline   25 mg Oral Daily   Continuous Infusions:  cefTRIAXone  (ROCEPHIN )  IV 2 g (11/11/23 1523)    Nutritional status     Body mass index is 41.85 kg/m.  Data Reviewed:   CBC: Recent Labs  Lab 11/08/23 1651 11/09/23 1253 11/10/23 0615 11/11/23 0740 11/12/23 0529  WBC 9.7 12.8* 11.4* 10.9* 10.0  NEUTROABS 5.7 8.8*  --   --   --   HGB 15.5* 15.9* 14.6 15.0 14.3  HCT 47.3* 52.0* 45.9 49.2* 45.5  MCV 95.0 97.9 98.9 100.8* 98.3  PLT 171 190 156 169 182   Basic Metabolic Panel: Recent Labs  Lab 11/08/23 1651 11/09/23 1253 11/10/23 0615 11/11/23 0740 11/12/23 0529  NA 137 137 135 134* 134*  K 4.4 4.3 4.4 4.0 4.3  CL 97* 98 100 99 99  CO2 28 28 22  21* 24  GLUCOSE 85 106* 126* 135* 140*  BUN 21 18 21  25* 27*  CREATININE 1.28* 1.07* 1.25* 1.28* 1.08*  CALCIUM  9.1 8.8* 8.2* 8.5* 8.4*  MG  --   --  2.5* 2.7* 2.5*  PHOS  --   --  3.9  --   --     GFR: Estimated Creatinine Clearance: 53.3 mL/min (A) (by C-G formula based on SCr of 1.08 mg/dL (H)). Liver Function Tests: Recent Labs  Lab 11/09/23 1253  AST 29  ALT 18  ALKPHOS 48  BILITOT 0.6  PROT 7.1  ALBUMIN 3.4*   No results for input(s): "LIPASE", "AMYLASE" in the last 168 hours. No results for input(s): "AMMONIA" in the last 168 hours. Coagulation Profile: No results for input(s): "INR", "PROTIME" in the last 168 hours. Cardiac Enzymes: No results for input(s): "CKTOTAL", "CKMB", "CKMBINDEX", "TROPONINI" in the last 168 hours. BNP (last  3 results) No results for input(s): "PROBNP" in the last 8760 hours. HbA1C: No results for input(s): "HGBA1C" in the last 72 hours. CBG: Recent Labs  Lab 11/11/23 0741 11/11/23 1223 11/11/23 1712 11/11/23 2041 11/12/23 0809  GLUCAP 159* 165* 137* 139* 164*   Lipid Profile: No results for input(s): "CHOL", "HDL", "LDLCALC", "TRIG", "CHOLHDL", "LDLDIRECT" in the last 72 hours. Thyroid  Function Tests: No results for input(s): "TSH", "T4TOTAL", "FREET4", "T3FREE", "THYROIDAB" in the last 72 hours. Anemia Panel: No results for input(s): "VITAMINB12", "FOLATE", "FERRITIN", "TIBC", "IRON", "RETICCTPCT" in the last 72 hours. Sepsis Labs: No results for input(s): "PROCALCITON", "LATICACIDVEN" in the last 168 hours.  Recent Results (from the past 240 hours)  Body fluid culture w Gram Stain     Status: None (Preliminary result)   Collection Time: 11/10/23  9:04 AM   Specimen: Synovium; Synovial Fluid  Result Value Ref Range Status   Specimen Description   Final    SYNOVIAL Performed at [facility], [address] [place], [place] [address]    Special Requests   Final    RIGHT KNEE Performed at [facility], [address], [place], [place] [address]    Gram Stain   Final    ABUNDANT WBC PRESENT, PREDOMINANTLY PMN NO ORGANISMS SEEN    Culture   Final    NO GROWTH 1 DAY Performed at  [facility], [address] [place], [place] [address]    Report Status PENDING  Incomplete  Anaerobic culture w Gram Stain     Status: None (Preliminary result)   Collection Time: 11/11/23  9:40 AM   Specimen: Synovium  Result Value Ref Range Status   Specimen Description   Final    SYNOVIAL Performed at [facility], [address], [place], [place] [address]    Special Requests   Final    RIGHT Performed at [facility], [address], [place], [place] [address]    Gram Stain   Final    ABUNDANT WBC SEEN NO ORGANISMS SEEN Performed at [facility], [address] [place], [place] [address]    Culture PENDING  Incomplete   Report Status PENDING  Incomplete         Radiology Studies: No results found.         LOS: 3 days   Time spent= 35 mins    Maggie Schooner, MD [facility]  If 7PM-7AM, please contact night-coverage  11/12/2023, 10:35 AM

## 2023-11-12 NOTE — Plan of Care (Signed)
  Problem: Nutritional: Goal: Maintenance of adequate nutrition will improve Outcome: Progressing   Problem: Skin Integrity: Goal: Risk for impaired skin integrity will decrease Outcome: Progressing   Problem: Pain Managment: Goal: General experience of comfort will improve and/or be controlled Outcome: Progressing   Problem: Safety: Goal: Ability to remain free from injury will improve Outcome: Progressing

## 2023-11-12 NOTE — Progress Notes (Signed)
 Physical Therapy Treatment Patient Details Name: JODE NIDO MRN: [id number] DOB: 25-Apr-1941 Today's Date: 11/12/2023   History of Present Illness Patient is a 83 year old female who presented with R knee pain. Patient was admitted with R knee effusion and LLE cellulitis. Patient underwent R knee aspiration on 4/27.  WUJ:WJXB, HTN, a fib, CHF,COPD    PT Comments  Cognition Comments: AxO x 3 pleasant and willing.  Stated she fell on the steps going into her Daughters [place] the day she was moving in.  Pt has been home alone since her Spouse passed last September.  Pt admits to "living" in her lift chair but was able to move "some" within the house.  Pt with KI in bed.  KI was ordered for "comfort" and "support" by ED MD then [facility] MD reviewed knee imaging and stated "can wear for support but does NOT have to".  KI is NOT needed while in bed/chair.  It is meant to offer "support" with transfers/amb.  Pt has been wearing it 24/7 since applied.    Assisted to EOB was difficult.  General bed mobility comments: required increased assist for upper body, use of bed pad to complete scooting to EOB and assist to guide R LE which had KI on. General transfer comment: attempted sit to stand however Pt was unable to off load hips from bed.  KI in place R LE makes it even more difficult to initiate standing one one leg due to her BMI.  Performed a lateral scoot from elevated bed to drop arm recliner using bed pad to complete.  Pt only able to offer 15% assistance.   Pt will need ST Rehab at SNF to address mobility and functional decline prior to safely returning home.     If plan is discharge home, recommend the following: A lot of help with walking and/or transfers;A lot of help with bathing/dressing/bathroom;Assist for transportation;Help with stairs or ramp for entrance;Assistance with cooking/housework   Can travel by private vehicle     No  Equipment Recommendations  None recommended by PT     Recommendations for Other Services       Precautions / Restrictions Precautions Precautions: Fall Recall of Precautions/Restrictions: Intact Precaution/Restrictions Comments: ED MD ordered KI and Ortho stated "can wear for support but does NOT have to" Restrictions Weight Bearing Restrictions Per Provider Order: No RLE Weight Bearing Per Provider Order: Weight bearing as tolerated     Mobility  Bed Mobility Overal bed mobility: Needs Assistance       Supine to sit: Max assist, Total assist     General bed mobility comments: required increased assist for upper body, use of bed pad to complete scooting to EOB and assist to guide R LE which had KI on.    Transfers Overall transfer level: Needs assistance Equipment used: None Transfers: Bed to chair/wheelchair/BSC             General transfer comment: attempted sit to stand however Pt was unable to off load hips from bed.  KI in place R LE makes it even more difficult to initiate standing one one leg due to her BMI.  Performed a lateral scoot from elevated bed to drop arm recliner using bed pad to complete.  Pt only able to offer 15% assistance.    Ambulation/Gait               General Gait Details: unable   Stairs  Wheelchair Mobility     Tilt Bed    Modified Rankin (Stroke Patients Only)       Balance                                            Communication Communication Communication: No apparent difficulties  Cognition Arousal: Alert                             PT - Cognition Comments: AxO x 3 pleasant and willing.  Stated she fell on the steps going into her Daughters [employer] the day she was moving in.  Pt has been home alone since her Spouse passed last September.  Pt admits to "living" in her lift chair but was able to move "some" within the house. Following commands: Intact      Cueing Cueing Techniques: Verbal cues  Exercises       General Comments        Pertinent Vitals/Pain Pain Assessment Pain Assessment: Faces Faces Pain Scale: Hurts a little bit Pain Location: R knee Pain Descriptors / Indicators: Constant, Grimacing, Discomfort Pain Intervention(s): Monitored during session, Repositioned    Home Living                          Prior Function            PT Goals (current goals can now be found in the care plan section) Progress towards PT goals: Progressing toward goals    Frequency    Min 3X/week      PT Plan      Co-evaluation              AM-PAC PT "6 Clicks" Mobility   Outcome Measure  Help needed turning from your back to your side while in a flat bed without using bedrails?: A Lot Help needed moving from lying on your back to sitting on the side of a flat bed without using bedrails?: A Lot Help needed moving to and from a bed to a chair (including a wheelchair)?: A Lot Help needed standing up from a chair using your arms (e.g., wheelchair or bedside chair)?: A Lot Help needed to walk in hospital room?: Total Help needed climbing 3-5 steps with a railing? : Total 6 Click Score: 10    End of Session   Activity Tolerance: Treatment limited secondary to medical complications (Comment) Patient left: in chair;with call bell/phone within reach Nurse Communication: Mobility status PT Visit Diagnosis: Muscle weakness (generalized) (M62.81);Difficulty in walking, not elsewhere classified (R26.2)     Time: 5784-6962 PT Time Calculation (min) (ACUTE ONLY): 25 min  Charges:    $Therapeutic Activity: 23-37 mins PT General Charges $$ ACUTE PT VISIT: 1 Visit                     {Loren Sawaya  PTA Acute  Rehabilitation Services Office M-F          [phone]

## 2023-11-12 NOTE — Plan of Care (Signed)
 Problem: Education: Goal: Ability to describe self-care measures that may prevent or decrease complications (Diabetes Survival Skills Education) will improve 11/12/2023 0441 by Ransom Byers, LPN Outcome: Progressing 11/12/2023 0441 by Ransom Byers, LPN Outcome: Progressing Goal: Individualized Educational Video(s) 11/12/2023 0441 by Ransom Byers, LPN Outcome: Progressing 11/12/2023 0441 by Ransom Byers, LPN Outcome: Progressing   Problem: Coping: Goal: Ability to adjust to condition or change in health will improve 11/12/2023 0441 by Teena Feast, Jasmarie Coppock, LPN Outcome: Progressing 11/12/2023 0441 by Ransom Byers, LPN Outcome: Progressing   Problem: Fluid Volume: Goal: Ability to maintain a balanced intake and output will improve 11/12/2023 0441 by Teena Feast, Daisi Kentner, LPN Outcome: Progressing 11/12/2023 0441 by Ransom Byers, LPN Outcome: Progressing   Problem: Health Behavior/Discharge Planning: Goal: Ability to identify and utilize available resources and services will improve 11/12/2023 0441 by Teena Feast, Brittanni Cariker, LPN Outcome: Progressing 11/12/2023 0441 by Ransom Byers, LPN Outcome: Progressing Goal: Ability to manage health-related needs will improve 11/12/2023 0441 by Teena Feast, Deyra Perdomo, LPN Outcome: Progressing 11/12/2023 0441 by Ransom Byers, LPN Outcome: Progressing   Problem: Metabolic: Goal: Ability to maintain appropriate glucose levels will improve 11/12/2023 0441 by Teena Feast, Dajia Gunnels, LPN Outcome: Progressing 11/12/2023 0441 by Ransom Byers, LPN Outcome: Progressing   Problem: Nutritional: Goal: Maintenance of adequate nutrition will improve 11/12/2023 0441 by Teena Feast, Lisandro Meggett, LPN Outcome: Progressing 11/12/2023 0441 by Ransom Byers, LPN Outcome: Progressing Goal: Progress toward achieving an optimal weight will improve 11/12/2023 0441 by Teena Feast, Swan Zayed, LPN Outcome: Progressing 11/12/2023 0441 by Ransom Byers,  LPN Outcome: Progressing   Problem: Skin Integrity: Goal: Risk for impaired skin integrity will decrease 11/12/2023 0441 by Teena Feast, Lynnel Zanetti, LPN Outcome: Progressing 11/12/2023 0441 by Ransom Byers, LPN Outcome: Progressing   Problem: Tissue Perfusion: Goal: Adequacy of tissue perfusion will improve 11/12/2023 0441 by Teena Feast, Ammarie Matsuura, LPN Outcome: Progressing 11/12/2023 0441 by Ransom Byers, LPN Outcome: Progressing   Problem: Education: Goal: Knowledge of General Education information will improve Description: Including pain rating scale, medication(s)/side effects and non-pharmacologic comfort measures 11/12/2023 0441 by Ransom Byers, LPN Outcome: Progressing 11/12/2023 0441 by Ransom Byers, LPN Outcome: Progressing   Problem: Health Behavior/Discharge Planning: Goal: Ability to manage health-related needs will improve 11/12/2023 0441 by Teena Feast, Newell Wafer, LPN Outcome: Progressing 11/12/2023 0441 by Ransom Byers, LPN Outcome: Progressing   Problem: Clinical Measurements: Goal: Ability to maintain clinical measurements within normal limits will improve 11/12/2023 0441 by Teena Feast, Bayden Gil, LPN Outcome: Progressing 11/12/2023 0441 by Ransom Byers, LPN Outcome: Progressing Goal: Will remain free from infection 11/12/2023 0441 by Teena Feast, Rema Lievanos, LPN Outcome: Progressing 11/12/2023 0441 by Ransom Byers, LPN Outcome: Progressing Goal: Diagnostic test results will improve 11/12/2023 0441 by Teena Feast, Habeeb Puertas, LPN Outcome: Progressing 11/12/2023 0441 by Ransom Byers, LPN Outcome: Progressing Goal: Respiratory complications will improve 11/12/2023 0441 by Ransom Byers, LPN Outcome: Progressing 11/12/2023 0441 by Ransom Byers, LPN Outcome: Progressing Goal: Cardiovascular complication will be avoided 11/12/2023 0441 by Ransom Byers, LPN Outcome: Progressing 11/12/2023 0441 by Ransom Byers, LPN Outcome: Progressing    Problem: Activity: Goal: Risk for activity intolerance will decrease Outcome: Progressing   Problem: Nutrition: Goal: Adequate nutrition will be maintained Outcome: Progressing   Problem: Coping: Goal: Level of anxiety will decrease Outcome: Progressing   Problem: Elimination: Goal: Will not experience complications related to bowel motility Outcome: Progressing Goal: Will not experience complications related to urinary retention Outcome: Progressing   Problem: Pain Managment: Goal: General experience of comfort will improve and/or be controlled Outcome: Progressing   Problem: Safety: Goal: Ability to remain free from injury will improve Outcome:  Progressing   Problem: Skin Integrity: Goal: Risk for impaired skin integrity will decrease Outcome: Progressing   Problem: Clinical Measurements: Goal: Ability to avoid or minimize complications of infection will improve Outcome: Progressing   Problem: Skin Integrity: Goal: Skin integrity will improve Outcome: Progressing

## 2023-11-13 DIAGNOSIS — M25461 Effusion, right knee: Secondary | ICD-10-CM | POA: Diagnosis not present

## 2023-11-13 LAB — BODY FLUID CULTURE W GRAM STAIN

## 2023-11-13 LAB — GLUCOSE, CAPILLARY
Glucose-Capillary: 148 mg/dL — ABNORMAL HIGH (ref 70–99)
Glucose-Capillary: 64 mg/dL — ABNORMAL LOW (ref 70–99)
Glucose-Capillary: 93 mg/dL (ref 70–99)
Glucose-Capillary: 180 mg/dL — ABNORMAL HIGH (ref 70–99)
Glucose-Capillary: 151 mg/dL — ABNORMAL HIGH (ref 70–99)

## 2023-11-13 LAB — BASIC METABOLIC PANEL WITH GFR
Anion gap: 7 (ref 5–15)
BUN: 24 mg/dL — ABNORMAL HIGH (ref 8–23)
CO2: 26 mmol/L (ref 22–32)
Calcium: 8.1 mg/dL — ABNORMAL LOW (ref 8.9–10.3)
Chloride: 100 mmol/L (ref 98–111)
Creatinine, Ser: 1.04 mg/dL — ABNORMAL HIGH (ref 0.44–1.00)
GFR, Estimated: 54 mL/min — ABNORMAL LOW (ref >60)
Glucose, Bld: 124 mg/dL — ABNORMAL HIGH (ref 70–99)
Potassium: 4.9 mmol/L (ref 3.5–5.1)
Sodium: 133 mmol/L — ABNORMAL LOW (ref 135–145)

## 2023-11-13 LAB — CBC
HCT: 42.5 % (ref 36.0–46.0)
Hemoglobin: 13.8 g/dL (ref 12.0–15.0)
MCH: 31.8 pg (ref 26.0–34.0)
MCHC: 32.5 g/dL (ref 30.0–36.0)
MCV: 97.9 fL (ref 80.0–100.0)
Platelets: 217 10*3/uL (ref 150–400)
RBC: 4.34 MIL/uL (ref 3.87–5.11)
RDW: 15.7 % — ABNORMAL HIGH (ref 11.5–15.5)
WBC: 9.6 10*3/uL (ref 4.0–10.5)
nRBC: 0 % (ref 0.0–0.2)

## 2023-11-13 LAB — MAGNESIUM: Magnesium: 2.5 mg/dL — ABNORMAL HIGH (ref 1.7–2.4)

## 2023-11-13 MED ORDER — BISACODYL 10 MG RE SUPP
10.0000 mg | Freq: Once | RECTAL | Status: AC
  Administered 2023-11-13: 10 mg via RECTAL
  Filled 2023-11-13: qty 1

## 2023-11-13 MED ORDER — MAGNESIUM CITRATE PO SOLN
1.0000 | Freq: Once | ORAL | Status: AC
  Administered 2023-11-13: 1 via ORAL
  Filled 2023-11-13: qty 296

## 2023-11-13 NOTE — TOC Progression Note (Addendum)
 Transition of Care [facility]) - Progression Note    Patient Details  Name: Heidi Stark MRN: [id number] Date of Birth: 09-Nov-1940  Transition of Care [facility]) CM/SW Contact  Jonni Nettle, LCSW Phone Number: 11/13/2023, 1:43 PM  Clinical Narrative:    CSW met with pt and daughter, Adel Aden, at bedside to discuss SNF bed choices. CSW reviewed bed offers and Medicare star-ratings for each facility. Pt and daughter accept [facility] bed offer. Per MD, pt is medically stable enough to begin insurance authorization. CSW notified [facility] admissions staff of pt accepting bed offer. CSW will begin insurance authorization process.  ADDENDUM  2:45 PM - Authorization has been approved for 11/14/2023 - 11/16/2023. Siegfried Dress ID: [id number]  [facility] [address] [place], [place] [address] [phone] Overall rating ?? Below average  [facility] [address] [place], [place] [address] [phone] Overall rating?? Below average  [facility] [address] [place], [place] [address] [phone] Overall rating ?? Much below average   [facility] [address] [place], [place] [address] [phone] Overall rating ?? Much below average  [employer] [facility] [employer] [address] [place], [place] [address] [phone] Overall rating ??? Average  [facility] [address] [place], [place] [address] [phone] Overall rating ? Much below average  [facility] [address] [place], [place] [address] [phone] Overall rating ?? Much below average  [facility] [address] [place], [place] [address] [phone] Overall rating ????? Above average  [facility] [address] [place], [place]  [address] [phone] Overall rating ???? Much above average  [facility] [address] [place], [place] [address] [phone] Overall rating ????  [facility] [address] [place], [place] [address] [phone] Overall rating ?? Much above average  Expected Discharge Plan: Skilled Nursing Facility Barriers to Discharge: English as a second language teacher, SNF Pending bed offer, Continued Medical Work up  Expected Discharge Plan and Services In-house Referral: Clinical Social Work Discharge Planning Services: NA Post Acute Care Choice: Skilled Nursing Facility Living arrangements for the past 2 months: Single Family Home                  Social Determinants of Health (SDOH) Interventions SDOH Screenings   Food Insecurity: No Food Insecurity (11/09/2023)  Housing: Low Risk  (11/09/2023)  Transportation Needs: No Transportation Needs (11/09/2023)  Utilities: Not At Risk (11/09/2023)  Alcohol Screen: Low Risk  (10/13/2021)  Depression (PHQ2-9): Low Risk  (09/14/2023)  Financial Resource Strain: Medium Risk (10/13/2021)  Physical Activity: Insufficiently Active (10/13/2021)  Social Connections: Moderately Integrated (11/09/2023)  Stress: No Stress Concern Present (10/13/2021)  Tobacco Use: Medium Risk (11/09/2023)    Readmission Risk Interventions    11/12/2023   10:09 AM  Readmission Risk Prevention Plan  Transportation Screening Complete  PCP or Specialist Appt within 3-5 Days Complete  HRI or Home Care Consult Complete  Social Work Consult for Recovery Care Planning/Counseling Complete  Palliative Care Screening Not Applicable  Medication Review [facility] Care [facility]) Complete   [place], LCSW 11/13/2023 1:47 PM

## 2023-11-13 NOTE — Progress Notes (Signed)
   11/13/23 2119  BiPAP/CPAP/SIPAP  BiPAP/CPAP/SIPAP Pt Type Adult (Patient prefers self placement when ready and is comfortable with mask placement and machine operation)  BiPAP/CPAP/SIPAP Resmed  Mask Type Full face mask  Dentures removed? No  Mask Size Medium  EPAP 8 cmH2O  FiO2 (%) 21 %  Patient Home Machine No  Patient Home Mask No  Patient Home Tubing No  Auto Titrate No  CPAP/SIPAP surface wiped down Yes  Device Plugged into RED Power Outlet Yes

## 2023-11-13 NOTE — Progress Notes (Signed)
 PROGRESS NOTE    Heidi Stark  ZOX:[id number] DOB: 12-09-1940 DOA: 11/09/2023 PCP: Estill Hemming, DO    Brief Narrative:   83 y.o. female with medical history significant of with past medical history of HTN, DM 2, HLD, P A-fib on Eliquis , splenic and renal infarcts, congestive heart failure with recovered ejection fraction 65%, hypothyroidism, history of VT comes to the hospital with complaints of right knee pain.  Patient diagnosed with left lower extremity cellulitis and has a right knee large effusion seen on CT scan.  Orthopedic follwing.  Patient was also started on IV Rocephin  for left lower extremity cellulitis.  Patient had arthrocentesis of the right knee performed on 4/26 and 4/27 both were bloody but WBCs improving, no organisms seen.  Culture pending.  Pain improved  Assessment & Plan:  Principal Problem:   Knee effusion, right Active Problems:   Cellulitis of left lower extremity   CKD (chronic kidney disease) stage 3, GFR 30-59 ml/min (HCC)   COPD (chronic obstructive pulmonary disease) (HCC)   Paroxysmal atrial fibrillation (HCC)   Type 2 diabetes mellitus with hyperlipidemia (HCC)   Essential hypertension   Persistent atrial fibrillation (HCC)    Left lower extremity cellulitis without purulent discharge - Does not appear to have any deeper infection.  Significantly improving on IV Rocephin .   Right knee pain with large effusion status post arthrocentesis/26 - Appears to be traumatic after her fall on her right knee.  CT does not show any evidence of fracture.  Knee immobilizer in place.  Orthopedic perform arthrocentesis which appeared to have elevated WBC and bloody.  And repeat arthrocentesis shows improvement in WBC but still appears bloody.  No organisms seen on either cultures.  Mom reports improvement in pain.  Pending PT evaluation.   Paroxysmal atrial fibrillation - Patient is on amiodarone , Toprol -XL and Eliquis .  IV as needed   Congestive heart  failure with preserved EF 65% - Appears to be euvolemic in nature at this time. - Continue current medication including Eliquis , Vascepa , Toprol -XL, Entresto , Farxiga .  Will resume eplerenone  upon discharge   Hyperlipidemia -vascepa    History of VT status post ICD - Continue home amiodarone  and mexiletine   CKD stage IIIb -Creatinine currently at baseline of 1.5    COPD - As needed bronchodilators   Hypothyroidism - Synthroid    Essential hypertension -Resume home meds.  IV as needed   Obstructive sleep apnea -Will order bedtime CPAP   Diabetes mellitus type 2 -Sliding scale and Accu-Cheks   PT/OT-SNF, TOC consulted  DVT prophylaxis: SCDs Start: 11/09/23 1529 apixaban  (ELIQUIS ) tablet 5 mg      Code Status: Full Code Family Communication:  Renee updated Status is: Inpatient Will need SNF placement Awaiting final orthopedic recommendations.  Subjective:  Patient was seen and examined at bedside today.  Patient reports improvement in pain in the right knee and the left ankle.  Cultures from the synovial fluid arthrocentesis still pending.  However initial lab analysis shows no organisms.  Examination:  General exam: Appears calm and comfortable  Respiratory system: Clear to auscultation. Respiratory effort normal. Cardiovascular system: S1 & S2 heard, RRR. No JVD, murmurs, rubs, gallops or clicks. No pedal edema. Gastrointestinal system: Abdomen is nondistended, soft and nontender. No organomegaly or masses felt. Normal bowel sounds heard. Central nervous system: Alert and oriented. No focal neurological deficits. Extremities: Symmetric 5 x 5 power. Skin: Left lower extremity erythema has significantly improved.  Right lower extremity/knee dressing is in place Psychiatry:  Judgement and insight appear normal. Mood & affect appropriate.                Diet Orders (From admission, onward)     Start     Ordered   11/09/23 1529  Diet heart healthy/carb  modified Room service appropriate? Yes; Fluid consistency: Thin  Diet effective now       Question Answer Comment  Diet-HS Snack? Nothing   Room service appropriate? Yes   Fluid consistency: Thin      11/09/23 1535            Objective: Vitals:   11/13/23 0446 11/13/23 0500 11/13/23 0849 11/13/23 1142  BP: 139/63  (!) 127/58 (!) 121/55  Pulse: 64  69 64  Resp: 18   15  Temp: (!) 97.5 F (36.4 C)   (!) 97.4 F (36.3 C)  TempSrc: Oral     SpO2: 98%   97%  Weight:  117.6 kg    Height:        Intake/Output Summary (Last 24 hours) at 11/13/2023 1159 Last data filed at 11/13/2023 0853 Gross per 24 hour  Intake 480 ml  Output 450 ml  Net 30 ml   Filed Weights   11/10/23 0506 11/12/23 0500 11/13/23 0500  Weight: 116.1 kg 117.6 kg 117.6 kg    Scheduled Meds:  amiodarone   200 mg Oral Daily   apixaban   5 mg Oral BID   dapagliflozin  propanediol  10 mg Oral Daily   docusate sodium   100 mg Oral BID   fenofibrate   54 mg Oral Daily   icosapent  Ethyl  2 g Oral BID   insulin  aspart  0-15 Units Subcutaneous TID WC   insulin  aspart  0-5 Units Subcutaneous QHS   levothyroxine   25 mcg Oral Q0600   metoprolol  succinate  25 mg Oral Daily   mexiletine  300 mg Oral BID   nystatin ointment   Topical BID   sacubitril -valsartan   1 tablet Oral BID   sertraline   25 mg Oral Daily   Continuous Infusions:  cefTRIAXone  (ROCEPHIN )  IV 2 g (11/12/23 1503)    Nutritional status     Body mass index is 41.85 kg/m.  Data Reviewed:   CBC: Recent Labs  Lab 11/08/23 1651 11/09/23 1253 11/10/23 0615 11/11/23 0740 11/12/23 0529 11/13/23 0531  WBC 9.7 12.8* 11.4* 10.9* 10.0 9.6  NEUTROABS 5.7 8.8*  --   --   --   --   HGB 15.5* 15.9* 14.6 15.0 14.3 13.8  HCT 47.3* 52.0* 45.9 49.2* 45.5 42.5  MCV 95.0 97.9 98.9 100.8* 98.3 97.9  PLT 171 190 156 169 182 217   Basic Metabolic Panel: Recent Labs  Lab 11/09/23 1253 11/10/23 0615 11/11/23 0740 11/12/23 0529 11/13/23 0531  NA 137  135 134* 134* 133*  K 4.3 4.4 4.0 4.3 4.9  CL 98 100 99 99 100  CO2 28 22 21* 24 26  GLUCOSE 106* 126* 135* 140* 124*  BUN 18 21 25* 27* 24*  CREATININE 1.07* 1.25* 1.28* 1.08* 1.04*  CALCIUM  8.8* 8.2* 8.5* 8.4* 8.1*  MG  --  2.5* 2.7* 2.5* 2.5*  PHOS  --  3.9  --   --   --    GFR: Estimated Creatinine Clearance: 55.3 mL/min (A) (by C-G formula based on SCr of 1.04 mg/dL (H)). Liver Function Tests: Recent Labs  Lab 11/09/23 1253  AST 29  ALT 18  ALKPHOS 48  BILITOT 0.6  PROT 7.1  ALBUMIN  3.4*   No results for input(s): "LIPASE", "AMYLASE" in the last 168 hours. No results for input(s): "AMMONIA" in the last 168 hours. Coagulation Profile: No results for input(s): "INR", "PROTIME" in the last 168 hours. Cardiac Enzymes: No results for input(s): "CKTOTAL", "CKMB", "CKMBINDEX", "TROPONINI" in the last 168 hours. BNP (last 3 results) No results for input(s): "PROBNP" in the last 8760 hours. HbA1C: No results for input(s): "HGBA1C" in the last 72 hours. CBG: Recent Labs  Lab 11/12/23 1210 11/12/23 1811 11/12/23 2132 11/13/23 0730 11/13/23 1144  GLUCAP 130* 151* 132* 148* 64*   Lipid Profile: No results for input(s): "CHOL", "HDL", "LDLCALC", "TRIG", "CHOLHDL", "LDLDIRECT" in the last 72 hours. Thyroid  Function Tests: No results for input(s): "TSH", "T4TOTAL", "FREET4", "T3FREE", "THYROIDAB" in the last 72 hours. Anemia Panel: No results for input(s): "VITAMINB12", "FOLATE", "FERRITIN", "TIBC", "IRON", "RETICCTPCT" in the last 72 hours. Sepsis Labs: No results for input(s): "PROCALCITON", "LATICACIDVEN" in the last 168 hours.  Recent Results (from the past 240 hours)  Body fluid culture w Gram Stain     Status: None (Preliminary result)   Collection Time: 11/10/23  9:04 AM   Specimen: Synovium; Synovial Fluid  Result Value Ref Range Status   Specimen Description   Final    SYNOVIAL Performed at [facility], [address] [place], [place]  [address]    Special Requests   Final    RIGHT KNEE Performed at [facility], [address], [place], [place] [address]    Gram Stain   Final    ABUNDANT WBC PRESENT, PREDOMINANTLY PMN NO ORGANISMS SEEN    Culture   Final    NO GROWTH 3 DAYS Performed at [facility], [address] [place], [place] [address]    Report Status PENDING  Incomplete  Anaerobic culture w Gram Stain     Status: None (Preliminary result)   Collection Time: 11/10/23  9:04 AM   Specimen: Synovium; Synovial Fluid  Result Value Ref Range Status   Specimen Description   Final    SYNOVIAL Performed at [facility], [address], [place], [place] [address]    Special Requests   Final    RIGHT KNEE Performed at [facility], [address] [place], [place] [address]    Culture   Final    NO ANAEROBES ISOLATED; CULTURE IN PROGRESS FOR 5 DAYS   Report Status PENDING  Incomplete  Anaerobic culture w Gram Stain     Status: None (Preliminary result)   Collection Time: 11/11/23  9:40 AM   Specimen: Synovium  Result Value Ref Range Status   Specimen Description   Final    SYNOVIAL Performed at [facility], [address], [place], [place] [address]    Special Requests   Final    RIGHT Performed at [facility], [address], [place], [place] [address]    Gram Stain   Final    ABUNDANT WBC SEEN NO ORGANISMS SEEN Performed at [facility], [address] [place], [place] [address]    Culture PENDING  Incomplete   Report Status PENDING  Incomplete         Radiology Studies: No results found.         LOS: 4 days   Time spent= 35 mins    Edwena Graham, MD [facility]  If 7PM-7AM, please contact night-coverage  11/13/2023, 11:59 AM

## 2023-11-13 NOTE — Plan of Care (Signed)
  Problem: Education: Goal: Individualized Educational Video(s) Outcome: Progressing   Problem: Health Behavior/Discharge Planning: Goal: Ability to manage health-related needs will improve Outcome: Progressing   Problem: Nutritional: Goal: Progress toward achieving an optimal weight will improve Outcome: Progressing   Problem: Education: Goal: Knowledge of General Education information will improve Description: Including pain rating scale, medication(s)/side effects and non-pharmacologic comfort measures Outcome: Progressing   Problem: Health Behavior/Discharge Planning: Goal: Ability to manage health-related needs will improve Outcome: Progressing   Problem: Clinical Measurements: Goal: Respiratory complications will improve Outcome: Progressing Goal: Cardiovascular complication will be avoided Outcome: Progressing   Problem: Coping: Goal: Level of anxiety will decrease Outcome: Progressing   Problem: Elimination: Goal: Will not experience complications related to urinary retention Outcome: Progressing   Problem: Safety: Goal: Ability to remain free from injury will improve Outcome: Progressing   Problem: Skin Integrity: Goal: Risk for impaired skin integrity will decrease Outcome: Progressing   Problem: Skin Integrity: Goal: Skin integrity will improve Outcome: Progressing

## 2023-11-14 DIAGNOSIS — I472 Ventricular tachycardia, unspecified: Secondary | ICD-10-CM | POA: Diagnosis not present

## 2023-11-14 DIAGNOSIS — I4819 Other persistent atrial fibrillation: Secondary | ICD-10-CM | POA: Diagnosis not present

## 2023-11-14 DIAGNOSIS — I130 Hypertensive heart and chronic kidney disease with heart failure and stage 1 through stage 4 chronic kidney disease, or unspecified chronic kidney disease: Secondary | ICD-10-CM | POA: Diagnosis not present

## 2023-11-14 DIAGNOSIS — M25461 Effusion, right knee: Secondary | ICD-10-CM | POA: Diagnosis not present

## 2023-11-14 DIAGNOSIS — R531 Weakness: Secondary | ICD-10-CM | POA: Diagnosis not present

## 2023-11-14 DIAGNOSIS — F17210 Nicotine dependence, cigarettes, uncomplicated: Secondary | ICD-10-CM | POA: Diagnosis not present

## 2023-11-14 DIAGNOSIS — M1711 Unilateral primary osteoarthritis, right knee: Secondary | ICD-10-CM | POA: Diagnosis not present

## 2023-11-14 DIAGNOSIS — E1169 Type 2 diabetes mellitus with other specified complication: Secondary | ICD-10-CM | POA: Diagnosis not present

## 2023-11-14 DIAGNOSIS — G4733 Obstructive sleep apnea (adult) (pediatric): Secondary | ICD-10-CM | POA: Diagnosis not present

## 2023-11-14 DIAGNOSIS — I5032 Chronic diastolic (congestive) heart failure: Secondary | ICD-10-CM | POA: Diagnosis not present

## 2023-11-14 DIAGNOSIS — D6859 Other primary thrombophilia: Secondary | ICD-10-CM | POA: Diagnosis not present

## 2023-11-14 DIAGNOSIS — L03116 Cellulitis of left lower limb: Secondary | ICD-10-CM | POA: Diagnosis not present

## 2023-11-14 DIAGNOSIS — I5022 Chronic systolic (congestive) heart failure: Secondary | ICD-10-CM | POA: Diagnosis not present

## 2023-11-14 DIAGNOSIS — N183 Chronic kidney disease, stage 3 unspecified: Secondary | ICD-10-CM | POA: Diagnosis not present

## 2023-11-14 DIAGNOSIS — Z743 Need for continuous supervision: Secondary | ICD-10-CM | POA: Diagnosis not present

## 2023-11-14 DIAGNOSIS — Z7401 Bed confinement status: Secondary | ICD-10-CM | POA: Diagnosis not present

## 2023-11-14 DIAGNOSIS — R2681 Unsteadiness on feet: Secondary | ICD-10-CM | POA: Diagnosis not present

## 2023-11-14 DIAGNOSIS — D6869 Other thrombophilia: Secondary | ICD-10-CM | POA: Diagnosis not present

## 2023-11-14 DIAGNOSIS — E785 Hyperlipidemia, unspecified: Secondary | ICD-10-CM | POA: Diagnosis not present

## 2023-11-14 DIAGNOSIS — E1122 Type 2 diabetes mellitus with diabetic chronic kidney disease: Secondary | ICD-10-CM | POA: Diagnosis not present

## 2023-11-14 DIAGNOSIS — R2689 Other abnormalities of gait and mobility: Secondary | ICD-10-CM | POA: Diagnosis not present

## 2023-11-14 DIAGNOSIS — E039 Hypothyroidism, unspecified: Secondary | ICD-10-CM | POA: Diagnosis not present

## 2023-11-14 DIAGNOSIS — Z9989 Dependence on other enabling machines and devices: Secondary | ICD-10-CM | POA: Diagnosis not present

## 2023-11-14 DIAGNOSIS — M6281 Muscle weakness (generalized): Secondary | ICD-10-CM | POA: Diagnosis not present

## 2023-11-14 DIAGNOSIS — Z86718 Personal history of other venous thrombosis and embolism: Secondary | ICD-10-CM | POA: Diagnosis not present

## 2023-11-14 DIAGNOSIS — I480 Paroxysmal atrial fibrillation: Secondary | ICD-10-CM | POA: Diagnosis not present

## 2023-11-14 DIAGNOSIS — Z7901 Long term (current) use of anticoagulants: Secondary | ICD-10-CM | POA: Diagnosis not present

## 2023-11-14 DIAGNOSIS — J449 Chronic obstructive pulmonary disease, unspecified: Secondary | ICD-10-CM | POA: Diagnosis not present

## 2023-11-14 DIAGNOSIS — Z8679 Personal history of other diseases of the circulatory system: Secondary | ICD-10-CM | POA: Diagnosis not present

## 2023-11-14 LAB — GLUCOSE, CAPILLARY
Glucose-Capillary: 138 mg/dL — ABNORMAL HIGH (ref 70–99)
Glucose-Capillary: 106 mg/dL — ABNORMAL HIGH (ref 70–99)

## 2023-11-14 LAB — CBC
HCT: 45.7 % (ref 36.0–46.0)
Hemoglobin: 14.7 g/dL (ref 12.0–15.0)
MCH: 31.2 pg (ref 26.0–34.0)
MCHC: 32.2 g/dL (ref 30.0–36.0)
MCV: 97 fL (ref 80.0–100.0)
Platelets: 227 10*3/uL (ref 150–400)
RBC: 4.71 MIL/uL (ref 3.87–5.11)
RDW: 15.6 % — ABNORMAL HIGH (ref 11.5–15.5)
WBC: 9.9 10*3/uL (ref 4.0–10.5)
nRBC: 0 % (ref 0.0–0.2)

## 2023-11-14 LAB — BASIC METABOLIC PANEL WITH GFR
Anion gap: 11 (ref 5–15)
BUN: 19 mg/dL (ref 8–23)
CO2: 24 mmol/L (ref 22–32)
Calcium: 8.2 mg/dL — ABNORMAL LOW (ref 8.9–10.3)
Chloride: 100 mmol/L (ref 98–111)
Creatinine, Ser: 0.6 mg/dL (ref 0.44–1.00)
GFR, Estimated: >60 mL/min (ref >60)
Glucose, Bld: 144 mg/dL — ABNORMAL HIGH (ref 70–99)
Potassium: 4 mmol/L (ref 3.5–5.1)
Sodium: 135 mmol/L (ref 135–145)

## 2023-11-14 LAB — MAGNESIUM: Magnesium: 3 mg/dL — ABNORMAL HIGH (ref 1.7–2.4)

## 2023-11-14 MED ORDER — AMOXICILLIN-POT CLAVULANATE 875-125 MG PO TABS: 1.0000 | ORAL_TABLET | Freq: Two times a day (BID) | ORAL | 0 refills | Status: AC

## 2023-11-14 MED ORDER — NICOTINE 21 MG/24HR TD PT24: 21.0000 mg | MEDICATED_PATCH | Freq: Every day | TRANSDERMAL | 0 refills | Status: DC | PRN

## 2023-11-14 NOTE — Progress Notes (Signed)
 [facility] Liaison Note  11/14/2023  Heidi Stark 1940/12/28 [id number]  Location: RN Hospital Liaison screened the patient remotely at [facility].  Insurance: [employer]   Heidi Stark is a 82 y.o. female who is a Primary Care Patient of Crecencio Dodge, Candida Chalk, DO [facility] [place] [facility]). The patient was screened for readmission hospitalization with noted high risk score for unplanned readmission risk with 2 IP/1 ED in 6 months.  The patient was assessed for potential Care Management service needs for post hospital transition for care coordination. Review of patient's electronic medical record reveals patient was admitted for Knee effusion right. Pt recommended for SNF level of care for ongoing rehabilitation. Liaison will collaborate with the PAC-RN concerning pt's discharge disposition.  Plan: [facility] Liaison will continue to follow progress and disposition to asess for post hospital community care coordination/management needs.  Referral request for community care coordination: Liaison will collaborate with [facility] PAC-RN concerning pt's discharge today to SNF level of care.    [facility] Care Management/Population Health does not replace or interfere with any arrangements made by the Inpatient Transition of Care team.   For questions contact:   Lilla Reichert, RN, BSN Hospital Liaison [place]   [facility], Population Health Office Hours MTWF  8:00 am-6:00 pm Direct Dial: [phone] mobile [email or web address]

## 2023-11-14 NOTE — TOC Transition Note (Signed)
 Transition of Care [facility]) - Discharge Note   Patient Details  Name: Heidi Stark MRN: [id number] Date of Birth: 02-27-1941  Transition of Care [facility]) CM/SW Contact:  Jonni Nettle, LCSW Phone Number: 11/14/2023, 1:07 PM   Clinical Narrative:    Pt has been accepted to [facility] for short-term SNF placement. Pt is admitting to room 208P. RN to call report to [phone]. CSW informed pt and daughter of transfer to SNF today. D/C packet placed at RN station. PTAR called at 12:32 PM. No further TOC needs at this time.   Final next level of care: Skilled Nursing Facility Barriers to Discharge: English as a second language teacher, SNF Pending bed offer, Continued Medical Work up   Patient Goals and CMS Choice Patient states their goals for this hospitalization and ongoing recovery are:: To discharge to SNF for short-term rehab CMS Medicare.gov Compare Post Acute Care list provided to:: Patient Choice offered to / list presented to : Patient [place] ownership interest in [facility].provided to:: Patient    Discharge Placement [facility]  Discharge Plan and Services Additional resources added to the After Visit Summary for  N/A In-house Referral: Clinical Social Work Discharge Planning Services: NA Post Acute Care Choice: Skilled Nursing Facility           Social Drivers of Health (SDOH) Interventions SDOH Screenings   Food Insecurity: No Food Insecurity (11/09/2023)  Housing: Low Risk  (11/09/2023)  Transportation Needs: No Transportation Needs (11/09/2023)  Utilities: Not At Risk (11/09/2023)  Alcohol Screen: Low Risk  (10/13/2021)  Depression (PHQ2-9): Low Risk  (09/14/2023)  Financial Resource Strain: Medium Risk (10/13/2021)  Physical Activity: Insufficiently Active (10/13/2021)  Social Connections: Moderately Integrated (11/09/2023)  Stress: No Stress Concern Present (10/13/2021)  Tobacco Use: Medium Risk (11/09/2023)      Readmission Risk Interventions    11/12/2023   10:09 AM  Readmission Risk Prevention Plan  Transportation Screening Complete  PCP or Specialist Appt within 3-5 Days Complete  HRI or Home Care Consult Complete  Social Work Consult for Recovery Care Planning/Counseling Complete  Palliative Care Screening Not Applicable  Medication Review [facility]) Complete    [place], LCSW 11/14/2023 1:10 PM

## 2023-11-14 NOTE — Discharge Summary (Signed)
 Physician Discharge Summary   Patient: Heidi Stark MRN: [id number] DOB: [birth year]  Admit date:     11/09/2023  Discharge date: 11/14/23  Discharge Physician: Edwena Graham   PCP: Estill Hemming, DO   Recommendations at discharge:   Follow-up with PCP/orthopedic surgery after discharge in 1 week to 10 days.  Discharge Diagnoses: Principal Problem:   Knee effusion, right Active Problems:   Cellulitis of left lower extremity   CKD (chronic kidney disease) stage 3, GFR 30-59 ml/min (HCC)   COPD (chronic obstructive pulmonary disease) (HCC)   Paroxysmal atrial fibrillation (HCC)   Type 2 diabetes mellitus with hyperlipidemia (HCC)   Essential hypertension   Persistent atrial fibrillation [facility])   Hospital Course: Brief Narrative:   83 y.o. female with medical history significant of with past medical history of HTN, DM 2, HLD, P A-fib on Eliquis , splenic and renal infarcts, congestive heart failure with recovered ejection fraction 65%, hypothyroidism, history of VT comes to the hospital with complaints of right knee pain.  Patient diagnosed with left lower extremity cellulitis and has a right knee large effusion seen on CT scan.  Orthopedic follwing.  Patient was also started on IV Rocephin  for left lower extremity cellulitis.  Patient had arthrocentesis of the right knee performed on 4/26 and 4/27 both were bloody but WBCs improving, no organisms seen.  Patient's overall cellulitis improved pain in the right knee also improved.  Patient was continued on Rocephin .  Physical therapy evaluated recommended rehab.  Patient is currently being discharged to skilled nursing facility on 5 more days of Augmentin .  Per sister patient has to follow-up with PCP and orthopedic surgery.  Assessment & Plan:  Principal Problem:   Knee effusion, right Active Problems:   Cellulitis of left lower extremity   CKD (chronic kidney disease) stage 3, GFR 30-59 ml/min (HCC)   COPD (chronic  obstructive pulmonary disease) (HCC)   Paroxysmal atrial fibrillation (HCC)   Type 2 diabetes mellitus with hyperlipidemia (HCC)   Essential hypertension   Persistent atrial fibrillation (HCC)    Left lower extremity cellulitis without purulent discharge - Does not appear to have any deeper infection.  Significantly improving on IV Rocephin .   Right knee pain with large effusion status post arthrocentesis/26 - Appears to be traumatic after her fall on her right knee.  CT does not show any evidence of fracture.  Knee immobilizer in place.  Orthopedic perform arthrocentesis which appeared to have elevated WBC and bloody.  And repeat arthrocentesis shows improvement in WBC but still appears bloody.  No organisms seen on either cultures.  Cultures no growth.  Patient being discharged on Augmentin .   Paroxysmal atrial fibrillation - Patient is on amiodarone , Toprol -XL and Eliquis .  IV as needed   Congestive heart failure with preserved EF 65% - Appears to be euvolemic in nature at this time. - Continue current medication including Eliquis , Vascepa , Toprol -XL, Entresto , Farxiga .  Will resume eplerenone  upon discharge - Patient euvolemic.   Hyperlipidemia -vascepa    History of VT status post ICD - Continue home amiodarone  and mexiletine   CKD stage IIIb -Creatinine currently at baseline of 1.5    COPD - As needed bronchodilators   Hypothyroidism - Synthroid    Essential hypertension -Resume home meds.  IV as needed   Obstructive sleep apnea -Will order bedtime CPAP   Diabetes mellitus type 2 -Sliding scale and Accu-Cheks   PT/OT-SNF, TOC consulted  DVT prophylaxis: SCDs Start: 11/09/23 1529 apixaban  (ELIQUIS ) tablet 5 mg  Code Status: Full Code Family Communication:  Renee updated Status is: Inpatient Will need SNF placement Awaiting final orthopedic recommendations.  Subjective: Has no complaints.  Pain involving the ankle cellulitis and right knee is  significantly improved. Examination:  General exam: Appears calm and comfortable  Respiratory system: Clear to auscultation. Respiratory effort normal. Cardiovascular system: S1 & S2 heard, RRR. No JVD, murmurs, rubs, gallops or clicks. No pedal edema. Gastrointestinal system: Abdomen is nondistended, soft and nontender. No organomegaly or masses felt. Normal bowel sounds heard. Central nervous system: Alert and oriented. No focal neurological deficits. Extremities: Symmetric 5 x 5 power. Skin: Left lower extremity erythema has significantly improved.  Right lower extremity/knee dressing is in place Psychiatry: Judgement and insight appear normal. Mood & affect appropriate.   Assessment and Plan: No notes have been filed under this hospital service. Service: Hospitalist        Consultants: Orthopedic surgery Procedures performed: Arthrocentesis Disposition: Skilled nursing facility Diet recommendation:  Discharge Diet Orders (From admission, onward)     Start     Ordered   11/14/23 0000  Diet - low sodium heart healthy        11/14/23 1153           Carb modified diet DISCHARGE MEDICATION: Allergies as of 11/14/2023       Reactions   Neomycin-bacitracin Zn-polymyx Rash   Sulfa Antibiotics Diarrhea, Nausea And Vomiting   Ciprofloxacin  Hives, Itching, Rash   Pt doesn't remember having any reaction to cipro    Spironolactone  Rash        Medication List     STOP taking these medications    cefadroxil 500 MG capsule Commonly known as: DURICEF   cefTRIAXone  250 mg/mL in lidocaine  (with preservative) injection   eplerenone  25 MG tablet Commonly known as: INSPRA    furosemide  40 MG tablet Commonly known as: LASIX        TAKE these medications    acetaminophen  500 MG tablet Commonly known as: TYLENOL  Take 500 mg by mouth every 6 (six) hours as needed for moderate pain (pain score 4-6) or headache.   amiodarone  200 MG tablet Commonly known as:  PACERONE  TAKE 1 TABLET BY MOUTH EVERY DAY   amoxicillin -clavulanate 875-125 MG tablet Commonly known as: AUGMENTIN  Take 1 tablet by mouth 2 (two) times daily for 5 days.   BD Pen Needle Nano 2nd Gen 32G X 4 MM Misc Generic drug: Insulin  Pen Needle See admin instructions.   dapagliflozin  propanediol 10 MG Tabs tablet Commonly known as: Farxiga  TAKE 1 TABLET BY MOUTH EVERY DAY BEFORE BREAKFAST   Eliquis  5 MG Tabs tablet Generic drug: apixaban  TAKE 1 TABLET BY MOUTH TWICE A DAY   Entresto  24-26 MG Generic drug: sacubitril -valsartan  Take 1 tablet by mouth 2 (two) times daily.   fenofibrate  145 MG tablet Commonly known as: TRICOR  TAKE 1 TABLET BY MOUTH EVERY DAY   gabapentin  300 MG capsule Commonly known as: NEURONTIN  Take 300 mg by mouth 2 (two) times daily as needed (pain).   HumaLOG  Mix 75/25 KwikPen (75-25) 100 UNIT/ML KwikPen Generic drug: Insulin  Lispro Prot & Lispro Take 40 units with morning meal and 30 units with evening meal What changed:  how much to take how to take this when to take this reasons to take this additional instructions   levothyroxine  25 MCG tablet Commonly known as: SYNTHROID  25 mcg 3 (three) times a week.   metoprolol  succinate 25 MG 24 hr tablet Commonly known as: TOPROL -XL TAKE 1 TABLET BY MOUTH  EVERYDAY AT BEDTIME   mexiletine 150 MG capsule Commonly known as: MEXITIL  TAKE 2 CAPSULES (300 MG TOTAL) BY MOUTH 2 (TWO) TIMES DAILY.   Mounjaro 2.5 MG/0.5ML Pen Generic drug: tirzepatide Inject 2.5 mg into the skin once a week. Thursdays   multivitamin with minerals Tabs tablet Take 1 tablet by mouth daily.   nicotine  21 mg/24hr patch Commonly known as: NICODERM CQ  - dosed in mg/24 hours Place 1 patch (21 mg total) onto the skin daily as needed (Nicotine  withdrawal).   OneTouch Verio test strip Generic drug: glucose blood 1 each 3 (three) times daily.   Pitavastatin  Calcium  4 MG Tabs TAKE 1 TABLET BY MOUTH EVERY DAY   potassium  chloride 10 MEQ tablet Commonly known as: Klor-Con  M10 Take 2 tablets (20 mEq total) by mouth 2 (two) times daily. NEEDS FOLLOW UP APPOINTMENT FOR MORE REFILLS   sertraline  25 MG tablet Commonly known as: ZOLOFT  TAKE 1 TABLET (25 MG TOTAL) BY MOUTH DAILY.   Vascepa  1 g capsule Generic drug: icosapent  Ethyl TAKE 2 CAPSULES BY MOUTH TWICE A DAY               Discharge Care Instructions  (From admission, onward)           Start     Ordered   11/14/23 0000  Discharge wound care:       Comments: Dressing the wound on the left ankle.wet to dry   11/14/23 1153            Contact information for follow-up providers     Neil Balls, MD Follow up in 10 day(s).   Specialty: Orthopedic Surgery Contact information: [address] [place] [address] [phone]         Roel Clarity R, DO Follow up in 1 week(s).   Specialty: Family Medicine Contact information: [address] [place] [address] [phone]              Contact information for after-discharge care     Destination     [facility], [facility] Preferred SNF .   Service: Skilled Nursing Contact information: [address] [place]  [id number] [phone]                    Discharge Exam: Filed Weights   11/12/23 0500 11/13/23 0500 11/14/23 0557  Weight: 117.6 kg 117.6 kg 122.4 kg   Patient is alert and oriented CVS: S1-S2 positive Respiratory: Bilateral clear and equal breath sounds.  Condition at discharge: fair  The results of significant diagnostics from this hospitalization (including imaging, microbiology, ancillary and laboratory) are listed below for reference.   Imaging Studies: CT Knee Right Wo Contrast Result Date: 11/09/2023 CLINICAL DATA:  Knee pain.  Fell yesterday. EXAM: CT OF THE RIGHT KNEE WITHOUT CONTRAST TECHNIQUE: Multidetector CT imaging of the right knee was performed according to the  standard protocol. Multiplanar CT image reconstructions were also generated. RADIATION DOSE REDUCTION: This exam was performed according to the departmental dose-optimization program which includes automated exposure control, adjustment of the mA and/or kV according to patient size and/or use of iterative reconstruction technique. COMPARISON:  Radiographs 11/08/2023 FINDINGS: No acute fracture is identified. Age related osteoporosis and advanced degenerative joint disease with joint space narrowing, osteophytic spurring and subchondral cystic change. This is most significant in the lateral compartment with there is near bone-on-bone appearance. 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 Chondrocalcinosis is also present and can be seen with CPPD arthropathy. The knee musculature demonstrates age related fatty atrophy but no obvious muscle tear or intramuscular hematoma. Scattered vascular calcifications. No subcutaneous lesions. The quadriceps and patellar tendons are intact. IMPRESSION: 1. No acute fracture is identified. 2. Age related osteoporosis and advanced degenerative joint disease. 3. 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 4. Chondrocalcinosis is also present and can be seen with CPPD arthropathy. 5. Age related fatty atrophy of the knee musculature but no obvious muscle tear or intramuscular hematoma. Electronically Signed   By: Marrian Siva M.D.   On: 11/09/2023 13:57   DG Ankle 2 Views Left Result Date: 11/08/2023 CLINICAL DATA:  Left ankle pain after fall. EXAM: LEFT ANKLE - 2 VIEW COMPARISON:  None Available. FINDINGS: There is no evidence of fracture, dislocation, or joint effusion. There is no evidence of arthropathy or other focal bone abnormality. Soft tissues are unremarkable. IMPRESSION: Negative. Electronically Signed   By:  Rosalene Colon M.D.   On: 11/08/2023 17:25   DG Knee 2 Views Right Result Date: 11/08/2023 CLINICAL DATA:  Right knee pain after fall. EXAM: RIGHT KNEE - 1-2 VIEW COMPARISON:  None Available. FINDINGS: Small suprapatellar joint effusion is noted. No fracture or dislocation is noted. Severe narrowing and osteophyte formation is seen involving lateral joint space. Mild osteophyte formation is noted medially. IMPRESSION: Small suprapatellar joint effusion. Severe degenerative joint disease is noted laterally. No fracture or dislocation. Electronically Signed   By: Rosalene Colon M.D.   On: 11/08/2023 17:24    Microbiology: Results for orders placed or performed during the hospital encounter of 11/09/23  Body fluid culture w Gram Stain     Status: None   Collection Time: 11/10/23  9:04 AM   Specimen: Synovium; Synovial Fluid  Result Value Ref Range Status   Specimen Description   Final    SYNOVIAL Performed at [facility], [address], [place], [place] [address]    Special Requests   Final    RIGHT KNEE Performed at [facility], [address], [place], [place] [address]    Gram Stain   Final    ABUNDANT WBC PRESENT, PREDOMINANTLY PMN NO ORGANISMS SEEN    Culture   Final    NO GROWTH 3 DAYS Performed at [facility], [address], [place], [place] [address]    Report Status 11/13/2023 FINAL  Final  Anaerobic culture w Gram Stain     Status: None (Preliminary result)   Collection Time: 11/10/23  9:04 AM   Specimen: Synovium; Synovial Fluid  Result Value Ref Range Status   Specimen Description   Final    SYNOVIAL Performed at [facility], [address] [place], [place] [address]    Special Requests   Final    RIGHT KNEE Performed at [facility], [address], [place], [place] [address]    Culture   Final    NO ANAEROBES ISOLATED; CULTURE IN PROGRESS FOR 5 DAYS   Report Status PENDING   Incomplete  Anaerobic culture w Gram Stain     Status: None (Preliminary result)   Collection Time: 11/11/23  9:40 AM   Specimen: Synovium  Result Value Ref Range Status   Specimen Description   Final    SYNOVIAL Performed at [facility], [address], [place], [place] [address]    Special Requests   Final    RIGHT Performed at [facility], [address], [place], [place] [address]    Gram Stain   Final    ABUNDANT WBC SEEN NO ORGANISMS SEEN Performed at [facility], [address], [place], [place] [address]    Culture   Final    NO ANAEROBES ISOLATED; CULTURE IN PROGRESS FOR 5 DAYS   Report Status PENDING  Incomplete   *Note: Due to a large number of results and/or encounters for the requested time period, some results have not been displayed. A complete set of results can be found in Results Review.    Labs: CBC: Recent Labs  Lab 11/08/23 1651 11/09/23 1253 11/10/23 0615 11/11/23 0740 11/12/23 0529 11/13/23 0531 11/14/23 0620  WBC 9.7 12.8* 11.4* 10.9* 10.0 9.6 9.9  NEUTROABS 5.7 8.8*  --   --   --   --   --   HGB 15.5* 15.9* 14.6 15.0 14.3 13.8 14.7  HCT 47.3* 52.0* 45.9 49.2* 45.5 42.5 45.7  MCV 95.0 97.9 98.9 100.8* 98.3 97.9 97.0  PLT 171 190 156 169 182 217 227   Basic Metabolic Panel: Recent Labs  Lab 11/10/23 0615 11/11/23 0740 11/12/23 0529 11/13/23 0531 11/14/23 0620  NA 135 134* 134* 133* 135  K 4.4 4.0 4.3 4.9 4.0  CL 100 99 99 100 100  CO2 22 21* 24 26 24   GLUCOSE 126* 135* 140* 124* 144*  BUN 21 25* 27* 24* 19  CREATININE 1.25* 1.28* 1.08* 1.04* 0.60  CALCIUM  8.2* 8.5* 8.4* 8.1* 8.2*  MG 2.5* 2.7* 2.5* 2.5* 3.0*  PHOS 3.9  --   --   --   --    Liver Function Tests: Recent Labs  Lab 11/09/23 1253  AST 29  ALT 18  ALKPHOS 48  BILITOT 0.6  PROT 7.1  ALBUMIN 3.4*   CBG: Recent Labs  Lab 11/13/23 1203 11/13/23 1658 11/13/23 2131 11/14/23 0737 11/14/23 1150  GLUCAP 93 180* 151*  138* 106*    Discharge time spent: greater than 30 minutes.  Signed: Edwena Graham, MD [facility] 11/14/2023

## 2023-11-15 ENCOUNTER — Telehealth: Payer: Self-pay

## 2023-11-15 DIAGNOSIS — E785 Hyperlipidemia, unspecified: Secondary | ICD-10-CM | POA: Diagnosis not present

## 2023-11-15 DIAGNOSIS — G4733 Obstructive sleep apnea (adult) (pediatric): Secondary | ICD-10-CM | POA: Diagnosis not present

## 2023-11-15 DIAGNOSIS — E1169 Type 2 diabetes mellitus with other specified complication: Secondary | ICD-10-CM | POA: Diagnosis not present

## 2023-11-15 DIAGNOSIS — M25461 Effusion, right knee: Secondary | ICD-10-CM | POA: Diagnosis not present

## 2023-11-15 DIAGNOSIS — E039 Hypothyroidism, unspecified: Secondary | ICD-10-CM | POA: Diagnosis not present

## 2023-11-15 LAB — ANAEROBIC CULTURE W GRAM STAIN

## 2023-11-15 NOTE — Telephone Encounter (Signed)
 Alert received from [employer] for Alert remote transmission: unsuccessful [facility] Alert transmission. RV Bipolar lead impedance 2014 ohms 4/26. Chronically elevated RV threshold with increasing impedance, currenlty 1976 ohms.  Patient is currently at [facility] [phone]. Spoke to [place], MDT rep and confirmed shock lead is stable. This may be addressed at f/u apt with EP APP.  Attempted to contact [place] facility, phone rings continuously. Did speak to patient and she has her remote monitor now plugged in.

## 2023-11-16 LAB — ANAEROBIC CULTURE W GRAM STAIN

## 2023-11-19 ENCOUNTER — Telehealth: Payer: Self-pay | Admitting: Cardiology

## 2023-11-19 DIAGNOSIS — I480 Paroxysmal atrial fibrillation: Secondary | ICD-10-CM | POA: Diagnosis not present

## 2023-11-19 DIAGNOSIS — R2681 Unsteadiness on feet: Secondary | ICD-10-CM | POA: Diagnosis not present

## 2023-11-19 DIAGNOSIS — I130 Hypertensive heart and chronic kidney disease with heart failure and stage 1 through stage 4 chronic kidney disease, or unspecified chronic kidney disease: Secondary | ICD-10-CM | POA: Diagnosis not present

## 2023-11-19 DIAGNOSIS — Z86718 Personal history of other venous thrombosis and embolism: Secondary | ICD-10-CM | POA: Diagnosis not present

## 2023-11-19 DIAGNOSIS — M25461 Effusion, right knee: Secondary | ICD-10-CM | POA: Diagnosis not present

## 2023-11-19 DIAGNOSIS — I5032 Chronic diastolic (congestive) heart failure: Secondary | ICD-10-CM | POA: Diagnosis not present

## 2023-11-19 DIAGNOSIS — M6281 Muscle weakness (generalized): Secondary | ICD-10-CM | POA: Diagnosis not present

## 2023-11-19 DIAGNOSIS — L03116 Cellulitis of left lower limb: Secondary | ICD-10-CM | POA: Diagnosis not present

## 2023-11-19 DIAGNOSIS — F17210 Nicotine dependence, cigarettes, uncomplicated: Secondary | ICD-10-CM | POA: Diagnosis not present

## 2023-11-19 DIAGNOSIS — G4733 Obstructive sleep apnea (adult) (pediatric): Secondary | ICD-10-CM | POA: Diagnosis not present

## 2023-11-19 DIAGNOSIS — E1122 Type 2 diabetes mellitus with diabetic chronic kidney disease: Secondary | ICD-10-CM | POA: Diagnosis not present

## 2023-11-19 DIAGNOSIS — Z8679 Personal history of other diseases of the circulatory system: Secondary | ICD-10-CM | POA: Diagnosis not present

## 2023-11-19 DIAGNOSIS — Z9989 Dependence on other enabling machines and devices: Secondary | ICD-10-CM | POA: Diagnosis not present

## 2023-11-19 DIAGNOSIS — N183 Chronic kidney disease, stage 3 unspecified: Secondary | ICD-10-CM | POA: Diagnosis not present

## 2023-11-19 NOTE — Telephone Encounter (Signed)
 Outreach  made to Pt.  Advised her device will alarm every day at the same time until she is able to come in for a device check.  Advised this cannot be turned off remotely.  Pt states she is just starting to walk again.  She has walked 15 feet.    Discussed \\upcoming  appointment scheduled for next Monday.  Will reach out to Pt later this week to determine how she is feeling and will reschedule then if needed.  Pt agreeable to this plan.

## 2023-11-19 NOTE — Telephone Encounter (Signed)
 Spoke with patient and she states her monitor to her pacemaker is still not doing right.  She fell and now is in rehab.  She would like to know if her daughter can bring the monitor in. She will not be able to come to her appointment with Dr. Lawana Pray.

## 2023-11-19 NOTE — Telephone Encounter (Signed)
 Pt requesting cb to discuss upcoming appt w/ [place] due to fall and not able to come in, wants to discuss options

## 2023-11-21 DIAGNOSIS — L03116 Cellulitis of left lower limb: Secondary | ICD-10-CM | POA: Diagnosis not present

## 2023-11-21 DIAGNOSIS — M6281 Muscle weakness (generalized): Secondary | ICD-10-CM | POA: Diagnosis not present

## 2023-11-21 DIAGNOSIS — R2681 Unsteadiness on feet: Secondary | ICD-10-CM | POA: Diagnosis not present

## 2023-11-26 ENCOUNTER — Ambulatory Visit: Attending: Cardiology | Admitting: Cardiology

## 2023-11-26 ENCOUNTER — Encounter: Payer: Self-pay | Admitting: Cardiology

## 2023-11-26 ENCOUNTER — Other Ambulatory Visit: Payer: Self-pay

## 2023-11-26 VITALS — BP 114/64 | HR 62 | Ht 66.0 in | Wt 250.0 lb

## 2023-11-26 DIAGNOSIS — I4819 Other persistent atrial fibrillation: Secondary | ICD-10-CM | POA: Diagnosis not present

## 2023-11-26 DIAGNOSIS — R2681 Unsteadiness on feet: Secondary | ICD-10-CM | POA: Diagnosis not present

## 2023-11-26 DIAGNOSIS — I5032 Chronic diastolic (congestive) heart failure: Secondary | ICD-10-CM | POA: Diagnosis not present

## 2023-11-26 DIAGNOSIS — D6869 Other thrombophilia: Secondary | ICD-10-CM | POA: Diagnosis not present

## 2023-11-26 DIAGNOSIS — Z79899 Other long term (current) drug therapy: Secondary | ICD-10-CM

## 2023-11-26 DIAGNOSIS — I5022 Chronic systolic (congestive) heart failure: Secondary | ICD-10-CM | POA: Diagnosis not present

## 2023-11-26 DIAGNOSIS — I472 Ventricular tachycardia, unspecified: Secondary | ICD-10-CM

## 2023-11-26 DIAGNOSIS — L03116 Cellulitis of left lower limb: Secondary | ICD-10-CM | POA: Diagnosis not present

## 2023-11-26 DIAGNOSIS — M6281 Muscle weakness (generalized): Secondary | ICD-10-CM | POA: Diagnosis not present

## 2023-11-26 LAB — CUP PACEART INCLINIC DEVICE CHECK
Battery Remaining Longevity: 110 mo
Battery Voltage: 2.97 V
Brady Statistic RV Percent Paced: 0.23 %
Date Time Interrogation Session: 20250512094207
HighPow Impedance: 68 Ohm
Implantable Lead Connection Status: 753985
Implantable Lead Implant Date: 20220302
Implantable Lead Location: 753860
Implantable Pulse Generator Implant Date: 20220302
Lead Channel Impedance Value: 1919 Ohm
Lead Channel Impedance Value: 2052 Ohm
Lead Channel Pacing Threshold Amplitude: 1.5 V
Lead Channel Pacing Threshold Amplitude: 1.875 V
Lead Channel Pacing Threshold Pulse Width: 0.4 ms
Lead Channel Pacing Threshold Pulse Width: 1 ms
Lead Channel Sensing Intrinsic Amplitude: 8.5 mV
Lead Channel Sensing Intrinsic Amplitude: 8.625 mV
Lead Channel Setting Pacing Amplitude: 3 V
Lead Channel Setting Pacing Pulse Width: 1 ms
Lead Channel Setting Sensing Sensitivity: 0.3 mV
Zone Setting Status: 755011
Zone Setting Status: 755011

## 2023-11-26 NOTE — Patient Instructions (Signed)
 Medication Instructions:  Your physician recommends that you continue on your current medications as directed. Please refer to the Current Medication list given to you today.  *If you need a refill on your cardiac medications before your next appointment, please call your pharmacy*  Lab Work: TODAY: TSH If you have labs (blood work) drawn today and your tests are completely normal, you will receive your results only by: MyChart Message (if you have MyChart) OR A paper copy in the mail If you have any lab test that is abnormal or we need to change your treatment, we will call you to review the results.  Follow-Up: At [facility], you and your health needs are our priority.  As part of our continuing mission to provide you with exceptional heart care, our providers are all part of one team.  This team includes your primary Cardiologist (physician) and Advanced Practice Providers or APPs (Physician Assistants and Nurse Practitioners) who all work together to provide you with the care you need, when you need it.  Your next appointment:   6 months  Provider:   You will see one of the following Advanced Practice Providers on your designated Care Team:   Mertha Abrahams, Kennard Pea [address]" [place], PA-C Suzann Riddle, NP Creighton Doffing, NP

## 2023-11-26 NOTE — Progress Notes (Signed)
  Electrophysiology Office Note:   Date:  11/26/2023  ID:  Heidi Stark, DOB Jan 13, 1941, MRN [id number]  Primary Cardiologist: Peder Bourdon, MD Primary Heart Failure: None Electrophysiologist: Caylor Cerino Cortland Ding, MD      History of Present Illness:   Heidi Stark is a 83 y.o. female with h/o atrial fibrillation, VT, coronary artery disease seen today for routine electrophysiology followup.   Since last being seen in our clinic the patient reports doing overall well.  She is in a rehab facility currently as she had a fall.  She did not have a fracture, but with significant pain, she is working on improving her walking.  She also was admitted last month with lower extremity cellulitis which has not been improving..  she denies chest pain, palpitations, dyspnea, PND, orthopnea, nausea, vomiting, dizziness, syncope, edema, weight gain, or early satiety.   Review of systems complete and found to be negative unless listed in HPI.      EP Information / Studies Reviewed:    EKG is not ordered today. EKG from 11/09/2023 reviewed which showed sinus rhythm, right bundle branch block      ICD Interrogation-  reviewed in detail today,  See PACEART report.  Device History: Medtronic Single Chamber ICD implanted 09/15/2020 for ventricular tachycardia History of appropriate therapy: Yes History of AAD therapy: Yes; currently on amiodarone    Risk Assessment/Calculations:    CHA2DS2-VASc Score = 7   This indicates a 11.2% annual risk of stroke. The patient's score is based upon: CHF History: 1 HTN History: 1 Diabetes History: 1 Stroke History: 0 Vascular Disease History: 1 Age Score: 2 Gender Score: 1            Physical Exam:   VS:  BP 114/64 (BP Location: Left Arm, Patient Position: Sitting, Cuff Size: Large)   Pulse 62   Ht 5\' 6"  (1.676 m)   Wt 250 lb (113.4 kg)   SpO2 94%   BMI 40.35 kg/m    Wt Readings from Last 3 Encounters:  11/26/23 250 lb (113.4 kg)  11/14/23 269 lb  13.5 oz (122.4 kg)  10/11/23 256 lb 6.4 oz (116.3 kg)     GEN: Well nourished, well developed in no acute distress NECK: No JVD; No carotid bruits CARDIAC: Regular rate and rhythm, no murmurs, rubs, gallops RESPIRATORY:  Clear to auscultation without rales, wheezing or rhonchi  ABDOMEN: Soft, non-tender, non-distended EXTREMITIES:  No edema; No deformity   ASSESSMENT AND PLAN:    Ventricular tachycardia s/p Medtronic single chamber ICD  euvolemic today Stable on an appropriate medical regimen Normal ICD function Sensing, threshold, impedance within normal limits Programming reviewed and appropriate for patient Continue amiodarone  mexiletine See Pace Art report Pacing impedance increasing.  Shock impedance stable.  Increased alerts to 3000 ohms.  2.  Paroxysmal atrial fibrillation: On Eliquis .  Minimal noted on device interrogation  3.  Chronic systolic heart failure: Ejection fraction is improved.  Has follow-up with heart failure cardiology.  4.  Coronary artery disease: Elevated calcium  score.  Continue Crestor   5.  Obstructive sleep apnea: CPAP compliance encouraged  6.  Secondary hypercoagulable state: On Eliquis   7.  High risk medication monitoring: On amiodarone .  TSH was elevated on most recent check during acute hospitalization.  Severina Sykora recheck today.  Disposition:   Follow up with EP APP in 6 months   Signed, Porschea Borys Cortland Ding, MD

## 2023-11-27 ENCOUNTER — Ambulatory Visit: Payer: Self-pay | Admitting: Cardiology

## 2023-11-27 DIAGNOSIS — L03116 Cellulitis of left lower limb: Secondary | ICD-10-CM | POA: Diagnosis not present

## 2023-11-27 DIAGNOSIS — M1711 Unilateral primary osteoarthritis, right knee: Secondary | ICD-10-CM | POA: Diagnosis not present

## 2023-11-27 LAB — TSH: TSH: 20.3 u[IU]/mL — ABNORMAL HIGH (ref 0.450–4.500)

## 2023-11-28 DIAGNOSIS — L03116 Cellulitis of left lower limb: Secondary | ICD-10-CM | POA: Diagnosis not present

## 2023-11-28 DIAGNOSIS — M6281 Muscle weakness (generalized): Secondary | ICD-10-CM | POA: Diagnosis not present

## 2023-11-28 DIAGNOSIS — R2681 Unsteadiness on feet: Secondary | ICD-10-CM | POA: Diagnosis not present

## 2023-12-03 DIAGNOSIS — M6281 Muscle weakness (generalized): Secondary | ICD-10-CM | POA: Diagnosis not present

## 2023-12-03 DIAGNOSIS — M25461 Effusion, right knee: Secondary | ICD-10-CM | POA: Diagnosis not present

## 2023-12-03 DIAGNOSIS — R2689 Other abnormalities of gait and mobility: Secondary | ICD-10-CM | POA: Diagnosis not present

## 2023-12-03 MED ORDER — LEVOTHYROXINE SODIUM 50 MCG PO TABS: 50.0000 ug | ORAL_TABLET | Freq: Every day | ORAL | 3 refills | Status: DC

## 2023-12-03 NOTE — Addendum Note (Signed)
 Addended by: Ruthe Coward on: 12/03/2023 01:39 PM   Modules accepted: Orders

## 2023-12-04 DIAGNOSIS — J449 Chronic obstructive pulmonary disease, unspecified: Secondary | ICD-10-CM | POA: Diagnosis not present

## 2023-12-04 DIAGNOSIS — I480 Paroxysmal atrial fibrillation: Secondary | ICD-10-CM | POA: Diagnosis not present

## 2023-12-04 DIAGNOSIS — I130 Hypertensive heart and chronic kidney disease with heart failure and stage 1 through stage 4 chronic kidney disease, or unspecified chronic kidney disease: Secondary | ICD-10-CM | POA: Diagnosis not present

## 2023-12-04 DIAGNOSIS — I5032 Chronic diastolic (congestive) heart failure: Secondary | ICD-10-CM | POA: Diagnosis not present

## 2023-12-04 DIAGNOSIS — N183 Chronic kidney disease, stage 3 unspecified: Secondary | ICD-10-CM | POA: Diagnosis not present

## 2023-12-04 DIAGNOSIS — M25461 Effusion, right knee: Secondary | ICD-10-CM | POA: Diagnosis not present

## 2023-12-04 DIAGNOSIS — R2689 Other abnormalities of gait and mobility: Secondary | ICD-10-CM | POA: Diagnosis not present

## 2023-12-04 DIAGNOSIS — Z7985 Long-term (current) use of injectable non-insulin antidiabetic drugs: Secondary | ICD-10-CM | POA: Diagnosis not present

## 2023-12-04 DIAGNOSIS — M6281 Muscle weakness (generalized): Secondary | ICD-10-CM | POA: Diagnosis not present

## 2023-12-04 DIAGNOSIS — E039 Hypothyroidism, unspecified: Secondary | ICD-10-CM | POA: Diagnosis not present

## 2023-12-04 DIAGNOSIS — Z7984 Long term (current) use of oral hypoglycemic drugs: Secondary | ICD-10-CM | POA: Diagnosis not present

## 2023-12-04 DIAGNOSIS — S91302A Unspecified open wound, left foot, initial encounter: Secondary | ICD-10-CM | POA: Diagnosis not present

## 2023-12-04 DIAGNOSIS — E1122 Type 2 diabetes mellitus with diabetic chronic kidney disease: Secondary | ICD-10-CM | POA: Diagnosis not present

## 2023-12-04 DIAGNOSIS — Z7901 Long term (current) use of anticoagulants: Secondary | ICD-10-CM | POA: Diagnosis not present

## 2023-12-12 DIAGNOSIS — E1122 Type 2 diabetes mellitus with diabetic chronic kidney disease: Secondary | ICD-10-CM | POA: Diagnosis not present

## 2023-12-12 DIAGNOSIS — N183 Chronic kidney disease, stage 3 unspecified: Secondary | ICD-10-CM | POA: Diagnosis not present

## 2023-12-12 DIAGNOSIS — G4733 Obstructive sleep apnea (adult) (pediatric): Secondary | ICD-10-CM | POA: Diagnosis not present

## 2023-12-12 DIAGNOSIS — E1151 Type 2 diabetes mellitus with diabetic peripheral angiopathy without gangrene: Secondary | ICD-10-CM | POA: Diagnosis not present

## 2023-12-12 DIAGNOSIS — Z7984 Long term (current) use of oral hypoglycemic drugs: Secondary | ICD-10-CM | POA: Diagnosis not present

## 2023-12-12 DIAGNOSIS — E039 Hypothyroidism, unspecified: Secondary | ICD-10-CM | POA: Diagnosis not present

## 2023-12-12 DIAGNOSIS — M19041 Primary osteoarthritis, right hand: Secondary | ICD-10-CM | POA: Diagnosis not present

## 2023-12-12 DIAGNOSIS — I472 Ventricular tachycardia, unspecified: Secondary | ICD-10-CM | POA: Diagnosis not present

## 2023-12-12 DIAGNOSIS — I5042 Chronic combined systolic (congestive) and diastolic (congestive) heart failure: Secondary | ICD-10-CM | POA: Diagnosis not present

## 2023-12-12 DIAGNOSIS — J449 Chronic obstructive pulmonary disease, unspecified: Secondary | ICD-10-CM | POA: Diagnosis not present

## 2023-12-12 DIAGNOSIS — E785 Hyperlipidemia, unspecified: Secondary | ICD-10-CM | POA: Diagnosis not present

## 2023-12-12 DIAGNOSIS — M19042 Primary osteoarthritis, left hand: Secondary | ICD-10-CM | POA: Diagnosis not present

## 2023-12-12 DIAGNOSIS — I130 Hypertensive heart and chronic kidney disease with heart failure and stage 1 through stage 4 chronic kidney disease, or unspecified chronic kidney disease: Secondary | ICD-10-CM | POA: Diagnosis not present

## 2023-12-12 DIAGNOSIS — I4819 Other persistent atrial fibrillation: Secondary | ICD-10-CM | POA: Diagnosis not present

## 2023-12-12 DIAGNOSIS — I4892 Unspecified atrial flutter: Secondary | ICD-10-CM | POA: Diagnosis not present

## 2023-12-12 DIAGNOSIS — Z794 Long term (current) use of insulin: Secondary | ICD-10-CM | POA: Diagnosis not present

## 2023-12-12 DIAGNOSIS — M19032 Primary osteoarthritis, left wrist: Secondary | ICD-10-CM | POA: Diagnosis not present

## 2023-12-12 DIAGNOSIS — M545 Low back pain, unspecified: Secondary | ICD-10-CM | POA: Diagnosis not present

## 2023-12-12 DIAGNOSIS — G8929 Other chronic pain: Secondary | ICD-10-CM | POA: Diagnosis not present

## 2023-12-12 DIAGNOSIS — Z7901 Long term (current) use of anticoagulants: Secondary | ICD-10-CM | POA: Diagnosis not present

## 2023-12-12 DIAGNOSIS — M19031 Primary osteoarthritis, right wrist: Secondary | ICD-10-CM | POA: Diagnosis not present

## 2023-12-12 DIAGNOSIS — M25461 Effusion, right knee: Secondary | ICD-10-CM | POA: Diagnosis not present

## 2023-12-13 ENCOUNTER — Ambulatory Visit: Payer: Medicare Other

## 2023-12-13 DIAGNOSIS — Z7901 Long term (current) use of anticoagulants: Secondary | ICD-10-CM | POA: Diagnosis not present

## 2023-12-13 DIAGNOSIS — I5042 Chronic combined systolic (congestive) and diastolic (congestive) heart failure: Secondary | ICD-10-CM | POA: Diagnosis not present

## 2023-12-13 DIAGNOSIS — E039 Hypothyroidism, unspecified: Secondary | ICD-10-CM | POA: Diagnosis not present

## 2023-12-13 DIAGNOSIS — Z7984 Long term (current) use of oral hypoglycemic drugs: Secondary | ICD-10-CM | POA: Diagnosis not present

## 2023-12-13 DIAGNOSIS — Z794 Long term (current) use of insulin: Secondary | ICD-10-CM | POA: Diagnosis not present

## 2023-12-13 DIAGNOSIS — I130 Hypertensive heart and chronic kidney disease with heart failure and stage 1 through stage 4 chronic kidney disease, or unspecified chronic kidney disease: Secondary | ICD-10-CM | POA: Diagnosis not present

## 2023-12-13 DIAGNOSIS — G4733 Obstructive sleep apnea (adult) (pediatric): Secondary | ICD-10-CM | POA: Diagnosis not present

## 2023-12-13 DIAGNOSIS — E785 Hyperlipidemia, unspecified: Secondary | ICD-10-CM | POA: Diagnosis not present

## 2023-12-13 DIAGNOSIS — N183 Chronic kidney disease, stage 3 unspecified: Secondary | ICD-10-CM | POA: Diagnosis not present

## 2023-12-13 DIAGNOSIS — I472 Ventricular tachycardia, unspecified: Secondary | ICD-10-CM | POA: Diagnosis not present

## 2023-12-13 DIAGNOSIS — M25461 Effusion, right knee: Secondary | ICD-10-CM | POA: Diagnosis not present

## 2023-12-13 DIAGNOSIS — E1122 Type 2 diabetes mellitus with diabetic chronic kidney disease: Secondary | ICD-10-CM | POA: Diagnosis not present

## 2023-12-13 DIAGNOSIS — E1151 Type 2 diabetes mellitus with diabetic peripheral angiopathy without gangrene: Secondary | ICD-10-CM | POA: Diagnosis not present

## 2023-12-13 DIAGNOSIS — M19042 Primary osteoarthritis, left hand: Secondary | ICD-10-CM | POA: Diagnosis not present

## 2023-12-13 DIAGNOSIS — G8929 Other chronic pain: Secondary | ICD-10-CM | POA: Diagnosis not present

## 2023-12-13 DIAGNOSIS — I4892 Unspecified atrial flutter: Secondary | ICD-10-CM | POA: Diagnosis not present

## 2023-12-13 DIAGNOSIS — M19032 Primary osteoarthritis, left wrist: Secondary | ICD-10-CM | POA: Diagnosis not present

## 2023-12-13 DIAGNOSIS — J449 Chronic obstructive pulmonary disease, unspecified: Secondary | ICD-10-CM | POA: Diagnosis not present

## 2023-12-13 DIAGNOSIS — M19031 Primary osteoarthritis, right wrist: Secondary | ICD-10-CM | POA: Diagnosis not present

## 2023-12-13 DIAGNOSIS — M545 Low back pain, unspecified: Secondary | ICD-10-CM | POA: Diagnosis not present

## 2023-12-13 DIAGNOSIS — M19041 Primary osteoarthritis, right hand: Secondary | ICD-10-CM | POA: Diagnosis not present

## 2023-12-13 DIAGNOSIS — I4819 Other persistent atrial fibrillation: Secondary | ICD-10-CM | POA: Diagnosis not present

## 2023-12-14 ENCOUNTER — Telehealth: Payer: Self-pay

## 2023-12-14 NOTE — Telephone Encounter (Signed)
 Verbal given

## 2023-12-14 NOTE — Telephone Encounter (Signed)
 Copied from CRM [id number]. Topic: Clinical - Home Health Verbal Orders >> Dec 14, 2023  8:36 AM Lisa Rideau A wrote: Caller/Agency: [facility] Callback Number: [phone] Service Requested: Physical Therapy Frequency: 1 week 1, 2 week 3, 1 week 1 Any new concerns about the patient? Yes Medication Eplerenone  25 MG is not in DC note from SNF and patient states she has been taking medicine for a long time. Patient also reporting is taking Levoehyroxine 50 MG instead of taking once a day she take three takes one time a week. Level II drug interaction showing between Eplerenone  and Potassium Chloride 

## 2023-12-16 ENCOUNTER — Emergency Department (HOSPITAL_COMMUNITY)

## 2023-12-16 ENCOUNTER — Inpatient Hospital Stay (HOSPITAL_COMMUNITY)
Admission: EM | Admit: 2023-12-16 | Discharge: 2023-12-27 | DRG: 603 | Disposition: A | Discharge status: Skilled Nursing Facility | Attending: Internal Medicine | Admitting: Internal Medicine

## 2023-12-16 ENCOUNTER — Encounter (HOSPITAL_COMMUNITY): Payer: Self-pay

## 2023-12-16 DIAGNOSIS — I700 Atherosclerosis of aorta: Secondary | ICD-10-CM | POA: Diagnosis not present

## 2023-12-16 DIAGNOSIS — I130 Hypertensive heart and chronic kidney disease with heart failure and stage 1 through stage 4 chronic kidney disease, or unspecified chronic kidney disease: Secondary | ICD-10-CM | POA: Diagnosis not present

## 2023-12-16 DIAGNOSIS — E1165 Type 2 diabetes mellitus with hyperglycemia: Secondary | ICD-10-CM

## 2023-12-16 DIAGNOSIS — Z8616 Personal history of COVID-19: Secondary | ICD-10-CM | POA: Diagnosis not present

## 2023-12-16 DIAGNOSIS — L03119 Cellulitis of unspecified part of limb: Secondary | ICD-10-CM | POA: Diagnosis present

## 2023-12-16 DIAGNOSIS — M25421 Effusion, right elbow: Secondary | ICD-10-CM | POA: Diagnosis not present

## 2023-12-16 DIAGNOSIS — N1831 Chronic kidney disease, stage 3a: Secondary | ICD-10-CM | POA: Diagnosis not present

## 2023-12-16 DIAGNOSIS — R9431 Abnormal electrocardiogram [ECG] [EKG]: Secondary | ICD-10-CM | POA: Diagnosis not present

## 2023-12-16 DIAGNOSIS — I10 Essential (primary) hypertension: Secondary | ICD-10-CM | POA: Diagnosis not present

## 2023-12-16 DIAGNOSIS — L03116 Cellulitis of left lower limb: Secondary | ICD-10-CM | POA: Diagnosis not present

## 2023-12-16 DIAGNOSIS — I5020 Unspecified systolic (congestive) heart failure: Secondary | ICD-10-CM | POA: Diagnosis not present

## 2023-12-16 DIAGNOSIS — L03113 Cellulitis of right upper limb: Principal | ICD-10-CM | POA: Diagnosis present

## 2023-12-16 DIAGNOSIS — F419 Anxiety disorder, unspecified: Secondary | ICD-10-CM | POA: Diagnosis present

## 2023-12-16 DIAGNOSIS — Z7985 Long-term (current) use of injectable non-insulin antidiabetic drugs: Secondary | ICD-10-CM

## 2023-12-16 DIAGNOSIS — Z7901 Long term (current) use of anticoagulants: Secondary | ICD-10-CM | POA: Diagnosis not present

## 2023-12-16 DIAGNOSIS — E1122 Type 2 diabetes mellitus with diabetic chronic kidney disease: Secondary | ICD-10-CM | POA: Diagnosis not present

## 2023-12-16 DIAGNOSIS — E66813 Obesity, class 3: Secondary | ICD-10-CM | POA: Diagnosis present

## 2023-12-16 DIAGNOSIS — I5022 Chronic systolic (congestive) heart failure: Secondary | ICD-10-CM | POA: Diagnosis not present

## 2023-12-16 DIAGNOSIS — I499 Cardiac arrhythmia, unspecified: Secondary | ICD-10-CM | POA: Diagnosis not present

## 2023-12-16 DIAGNOSIS — Z5986 Financial insecurity: Secondary | ICD-10-CM

## 2023-12-16 DIAGNOSIS — E039 Hypothyroidism, unspecified: Secondary | ICD-10-CM | POA: Diagnosis not present

## 2023-12-16 DIAGNOSIS — Z6841 Body Mass Index (BMI) 40.0 and over, adult: Secondary | ICD-10-CM

## 2023-12-16 DIAGNOSIS — E782 Mixed hyperlipidemia: Secondary | ICD-10-CM | POA: Diagnosis not present

## 2023-12-16 DIAGNOSIS — Z794 Long term (current) use of insulin: Secondary | ICD-10-CM

## 2023-12-16 DIAGNOSIS — Z751 Person awaiting admission to adequate facility elsewhere: Secondary | ICD-10-CM

## 2023-12-16 DIAGNOSIS — I480 Paroxysmal atrial fibrillation: Secondary | ICD-10-CM | POA: Diagnosis not present

## 2023-12-16 DIAGNOSIS — I484 Atypical atrial flutter: Secondary | ICD-10-CM | POA: Diagnosis present

## 2023-12-16 DIAGNOSIS — Z95810 Presence of automatic (implantable) cardiac defibrillator: Secondary | ICD-10-CM

## 2023-12-16 DIAGNOSIS — M7020 Olecranon bursitis, unspecified elbow: Secondary | ICD-10-CM | POA: Insufficient documentation

## 2023-12-16 DIAGNOSIS — I739 Peripheral vascular disease, unspecified: Secondary | ICD-10-CM | POA: Diagnosis present

## 2023-12-16 DIAGNOSIS — Z7409 Other reduced mobility: Secondary | ICD-10-CM | POA: Diagnosis present

## 2023-12-16 DIAGNOSIS — E785 Hyperlipidemia, unspecified: Secondary | ICD-10-CM | POA: Diagnosis not present

## 2023-12-16 DIAGNOSIS — I472 Ventricular tachycardia, unspecified: Secondary | ICD-10-CM | POA: Diagnosis not present

## 2023-12-16 DIAGNOSIS — R410 Disorientation, unspecified: Secondary | ICD-10-CM | POA: Diagnosis not present

## 2023-12-16 DIAGNOSIS — E1151 Type 2 diabetes mellitus with diabetic peripheral angiopathy without gangrene: Secondary | ICD-10-CM | POA: Diagnosis present

## 2023-12-16 DIAGNOSIS — N1832 Chronic kidney disease, stage 3b: Secondary | ICD-10-CM | POA: Diagnosis not present

## 2023-12-16 DIAGNOSIS — Z87891 Personal history of nicotine dependence: Secondary | ICD-10-CM

## 2023-12-16 DIAGNOSIS — Z882 Allergy status to sulfonamides status: Secondary | ICD-10-CM

## 2023-12-16 DIAGNOSIS — F32A Depression, unspecified: Secondary | ICD-10-CM | POA: Diagnosis present

## 2023-12-16 DIAGNOSIS — Z743 Need for continuous supervision: Secondary | ICD-10-CM | POA: Diagnosis not present

## 2023-12-16 DIAGNOSIS — G4733 Obstructive sleep apnea (adult) (pediatric): Secondary | ICD-10-CM | POA: Diagnosis present

## 2023-12-16 DIAGNOSIS — Z86718 Personal history of other venous thrombosis and embolism: Secondary | ICD-10-CM

## 2023-12-16 DIAGNOSIS — M7021 Olecranon bursitis, right elbow: Secondary | ICD-10-CM | POA: Diagnosis not present

## 2023-12-16 DIAGNOSIS — Z833 Family history of diabetes mellitus: Secondary | ICD-10-CM

## 2023-12-16 DIAGNOSIS — I5032 Chronic diastolic (congestive) heart failure: Secondary | ICD-10-CM | POA: Diagnosis present

## 2023-12-16 DIAGNOSIS — R531 Weakness: Secondary | ICD-10-CM | POA: Diagnosis not present

## 2023-12-16 DIAGNOSIS — Z79890 Hormone replacement therapy (postmenopausal): Secondary | ICD-10-CM

## 2023-12-16 DIAGNOSIS — N183 Chronic kidney disease, stage 3 unspecified: Secondary | ICD-10-CM | POA: Diagnosis present

## 2023-12-16 DIAGNOSIS — Z87442 Personal history of urinary calculi: Secondary | ICD-10-CM

## 2023-12-16 DIAGNOSIS — Z881 Allergy status to other antibiotic agents status: Secondary | ICD-10-CM

## 2023-12-16 DIAGNOSIS — Z808 Family history of malignant neoplasm of other organs or systems: Secondary | ICD-10-CM

## 2023-12-16 DIAGNOSIS — N179 Acute kidney failure, unspecified: Secondary | ICD-10-CM | POA: Diagnosis present

## 2023-12-16 DIAGNOSIS — J449 Chronic obstructive pulmonary disease, unspecified: Secondary | ICD-10-CM | POA: Diagnosis not present

## 2023-12-16 DIAGNOSIS — Z8249 Family history of ischemic heart disease and other diseases of the circulatory system: Secondary | ICD-10-CM

## 2023-12-16 DIAGNOSIS — E871 Hypo-osmolality and hyponatremia: Secondary | ICD-10-CM | POA: Diagnosis present

## 2023-12-16 DIAGNOSIS — Z79899 Other long term (current) drug therapy: Secondary | ICD-10-CM

## 2023-12-16 DIAGNOSIS — Z832 Family history of diseases of the blood and blood-forming organs and certain disorders involving the immune mechanism: Secondary | ICD-10-CM

## 2023-12-16 DIAGNOSIS — Z888 Allergy status to other drugs, medicaments and biological substances status: Secondary | ICD-10-CM

## 2023-12-16 DIAGNOSIS — I517 Cardiomegaly: Secondary | ICD-10-CM | POA: Diagnosis not present

## 2023-12-16 DIAGNOSIS — R0689 Other abnormalities of breathing: Secondary | ICD-10-CM | POA: Diagnosis not present

## 2023-12-16 MED ORDER — ONDANSETRON HCL 4 MG/2ML IJ SOLN
4.0000 mg | Freq: Once | INTRAMUSCULAR | Status: AC
  Administered 2023-12-16: 4 mg via INTRAVENOUS
  Filled 2023-12-16: qty 2

## 2023-12-16 NOTE — ED Provider Notes (Signed)
 [place] EMERGENCY DEPARTMENT AT [facility] Provider Note   CSN: [id number] Arrival date & time: 12/16/23  2246     History {Add pertinent medical, surgical, social history, OB history to HPI:1} Chief Complaint  Patient presents with   Weakness    Heidi Stark is a 83 y.o. female.  The history is provided by the patient.  Weakness She has history of hypertension, diabetes, hyperlipidemia, heart failure, atrial fibrillation anticoagulated on apixaban , chronic kidney disease and comes in because of pain and redness and swelling of the right elbow which started yesterday and got worse today.  She did see her physician who started her on cephalexin .  She denies fever, but does endorse chills and sweats.  She is felt generally weak today and her daughter states that she has been sleeping all day and been confused.  She does have history of having had sepsis from MRSA.  She has mild cough productive of some clear to yellow sputum which is something that she has intermittently.  She endorses nausea but denies sore throat, vomiting, diarrhea, dysuria.  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   Home Medications Prior to Admission medications   Medication Sig Start Date End Date Taking? Authorizing Provider  acetaminophen  (TYLENOL ) 500 MG tablet Take 500 mg by mouth every 6 (six) hours as needed for moderate pain (pain score 4-6) or headache.    [provider]  amiodarone  (PACERONE ) 200 MG tablet TAKE 1 TABLET BY MOUTH EVERY DAY 08/23/23   Darlis Eisenmenger, MD  BD PEN NEEDLE NANO 2ND GEN 32G X 4 MM MISC See admin instructions. 09/24/21   [provider]  dapagliflozin  propanediol (FARXIGA ) 10 MG TABS tablet TAKE 1 TABLET BY MOUTH EVERY DAY BEFORE BREAKFAST 05/31/22   Crecencio Dodge, Yvonne R, DO  ELIQUIS  5 MG TABS tablet TAKE 1 TABLET BY MOUTH TWICE A DAY 06/12/23   Darlis Eisenmenger, MD  ENTRESTO  24-26 MG Take 1 tablet by mouth 2 (two) times daily. 10/15/23   [provider]  eplerenone  (INSPRA ) 25 MG tablet TAKE 1 TABLET BY MOUTH EVERY DAY 02/21/23   Darlis Eisenmenger, MD  fenofibrate  (TRICOR ) 145 MG tablet TAKE 1 TABLET BY MOUTH EVERY DAY 01/22/23   Darlis Eisenmenger, MD  furosemide  (LASIX ) 40 MG tablet Take 40 mg by mouth 2 (two) times daily. Take 2 tablets by mouth twice daily 11/15/23   [provider]  gabapentin  (NEURONTIN ) 300 MG capsule Take 300 mg by mouth 2 (two) times daily as needed (pain). 07/31/22   [provider]  HUMALOG  MIX 75/25 KWIKPEN (75-25) 100 UNIT/ML KwikPen Take 40 units with morning meal and 30 units with evening meal Patient taking differently: Inject 30 Units into the skin 2 (two) times daily as needed (hyperglycemia). 05/31/22   Estill Hemming, DO  levothyroxine  (SYNTHROID ) 50 MCG tablet Take 1 tablet (50 mcg total) by mouth daily. 12/03/23   Crecencio Dodge, Candida Chalk, DO  metoprolol  succinate (TOPROL -XL) 25 MG 24 hr tablet TAKE 1 TABLET BY MOUTH EVERYDAY AT BEDTIME 04/23/23   Sheryl Donna, NP  mexiletine (MEXITIL ) 150 MG capsule TAKE 2 CAPSULES (300 MG TOTAL) BY MOUTH 2 (TWO) TIMES DAILY. 05/10/23   Camnitz, Babetta Lesch, MD  MOUNJARO 2.5 MG/0.5ML Pen Inject 2.5 mg into the skin once a week. Thursdays    [provider]  Multiple Vitamin (MULTIVITAMIN WITH MINERALS) TABS tablet Take 1 tablet by mouth daily.  [provider]  nicotine  (NICODERM CQ  - DOSED IN MG/24 HOURS) 21 mg/24hr patch Place 1 patch (21 mg total) onto the skin daily as needed (Nicotine  withdrawal). 11/14/23   Edwena Graham, MD  [facility] VERIO test strip 1 each 3 (three) times daily. 10/09/21   [provider]  Pitavastatin  Calcium  4 MG TABS TAKE 1 TABLET BY MOUTH EVERY DAY 10/29/23   Darlis Eisenmenger, MD  potassium chloride  (KLOR-CON  M10) 10 MEQ tablet Take 2 tablets (20 mEq total) by mouth 2 (two) times daily. NEEDS FOLLOW UP APPOINTMENT FOR MORE REFILLS 08/30/23   Darlis Eisenmenger, MD  sertraline  (ZOLOFT ) 25 MG  tablet TAKE 1 TABLET (25 MG TOTAL) BY MOUTH DAILY. 10/15/23   Roel Clarity R, DO  VASCEPA  1 g capsule TAKE 2 CAPSULES BY MOUTH TWICE A DAY 10/10/23   Darlis Eisenmenger, MD      Allergies    Neomycin-bacitracin zn-polymyx, Sulfa antibiotics, Ciprofloxacin , and Spironolactone     Review of Systems   Review of Systems  Neurological:  Positive for weakness.  All other systems reviewed and are negative.   Physical Exam Updated Vital Signs BP 115/85   Pulse 69   Temp 98.1 F (36.7 C) (Oral)   Resp 17   SpO2 95%  Physical Exam Vitals and nursing note reviewed.   83 year old female, resting comfortably and in no acute distress. Vital signs are normal. Oxygen saturation is 95%, which is normal. Head is normocephalic and atraumatic. PERRLA, EOMI. Oropharynx is clear. Neck is nontender and supple without adenopathy. Lungs are clear without rales, wheezes, or rhonchi. Chest is nontender.  Pacemaker palpable in the left subclavian area. Heart has an irregular rhythm without murmur. Abdomen is soft, flat, nontender. Extremities: There is erythema and warmth and slight swelling of the right elbow without any detectable effusion.  There are no lymphangitic streaks. Skin is warm and dry without other rash. Neurologic: Awake and alert, moves all extremities equally.    ED Results / Procedures / Treatments   Labs (all labs ordered are listed, but only abnormal results are displayed) Labs Reviewed - No data to display  EKG None  Radiology No results found.  Procedures Procedures  Cardiac monitor shows atrial fibrillation with controlled ventricular response, per my interpretation.  Medications Ordered in ED Medications - No data to display  ED Course/ Medical Decision Making/ A&P   {   Click here for ABCD2, HEART and other calculatorsREFRESH Note before signing :1}                              Medical Decision Making  Cellulitis and possible olecranon bursitis of the right  elbow.  Confusion and weakness concerning for possible occult sepsis.  I am concerned that cephalexin  might not be an appropriate antibiotic and patient has history of MRSA.  On review of her past records, she had a hospitalization on 05/12/2023 for cellulitis and sepsis at which time she did test positive for MRSA but was treated with ceftriaxone .  She had a hospitalization on 11/09/2023 for cellulitis treated with ceftriaxone .  {Document critical care time when appropriate:1} {Document review of labs and clinical decision tools ie heart score, Chads2Vasc2 etc:1}  {Document your independent review of radiology images, and any outside records:1} {Document your discussion with family members, caretakers, and with consultants:1} {Document social determinants of health affecting pt's care:1} {Document your decision making why or why not  admission, treatments were needed:1} Final Clinical Impression(s) / ED Diagnoses Final diagnoses:  None    Rx / DC Orders ED Discharge Orders     None

## 2023-12-16 NOTE — ED Triage Notes (Signed)
 Pt comes from home via [facility] E[facility] for generalized weakness. Pt had redness and swelling to R elbow, started on antibiotic today for it.

## 2023-12-17 ENCOUNTER — Other Ambulatory Visit: Payer: Self-pay

## 2023-12-17 ENCOUNTER — Emergency Department (HOSPITAL_COMMUNITY)

## 2023-12-17 ENCOUNTER — Encounter (HOSPITAL_COMMUNITY): Payer: Self-pay | Admitting: Internal Medicine

## 2023-12-17 DIAGNOSIS — Z794 Long term (current) use of insulin: Secondary | ICD-10-CM | POA: Diagnosis not present

## 2023-12-17 DIAGNOSIS — M7021 Olecranon bursitis, right elbow: Secondary | ICD-10-CM

## 2023-12-17 DIAGNOSIS — M25421 Effusion, right elbow: Secondary | ICD-10-CM

## 2023-12-17 DIAGNOSIS — R531 Weakness: Secondary | ICD-10-CM | POA: Diagnosis not present

## 2023-12-17 DIAGNOSIS — I517 Cardiomegaly: Secondary | ICD-10-CM | POA: Diagnosis not present

## 2023-12-17 DIAGNOSIS — L03119 Cellulitis of unspecified part of limb: Secondary | ICD-10-CM | POA: Diagnosis present

## 2023-12-17 DIAGNOSIS — I700 Atherosclerosis of aorta: Secondary | ICD-10-CM | POA: Diagnosis not present

## 2023-12-17 DIAGNOSIS — E1165 Type 2 diabetes mellitus with hyperglycemia: Secondary | ICD-10-CM

## 2023-12-17 DIAGNOSIS — M7020 Olecranon bursitis, unspecified elbow: Secondary | ICD-10-CM | POA: Insufficient documentation

## 2023-12-17 LAB — COMPREHENSIVE METABOLIC PANEL WITH GFR
ALT: 16 U/L (ref 0–44)
AST: 38 U/L (ref 15–41)
Albumin: 3.5 g/dL (ref 3.5–5.0)
Alkaline Phosphatase: 52 U/L (ref 38–126)
Anion gap: 12 (ref 5–15)
BUN: 18 mg/dL (ref 8–23)
CO2: 24 mmol/L (ref 22–32)
Calcium: 7.9 mg/dL — ABNORMAL LOW (ref 8.9–10.3)
Chloride: 97 mmol/L — ABNORMAL LOW (ref 98–111)
Creatinine, Ser: 0.99 mg/dL (ref 0.44–1.00)
GFR, Estimated: 57 mL/min — ABNORMAL LOW (ref >60)
Glucose, Bld: 151 mg/dL — ABNORMAL HIGH (ref 70–99)
Potassium: 3.7 mmol/L (ref 3.5–5.1)
Sodium: 133 mmol/L — ABNORMAL LOW (ref 135–145)
Total Bilirubin: 1.2 mg/dL (ref 0.0–1.2)
Total Protein: 6.9 g/dL (ref 6.5–8.1)

## 2023-12-17 LAB — BASIC METABOLIC PANEL WITH GFR
Anion gap: 12 (ref 5–15)
BUN: 19 mg/dL (ref 8–23)
CO2: 24 mmol/L (ref 22–32)
Calcium: 8 mg/dL — ABNORMAL LOW (ref 8.9–10.3)
Chloride: 98 mmol/L (ref 98–111)
Creatinine, Ser: 1.04 mg/dL — ABNORMAL HIGH (ref 0.44–1.00)
GFR, Estimated: 54 mL/min — ABNORMAL LOW (ref >60)
Glucose, Bld: 137 mg/dL — ABNORMAL HIGH (ref 70–99)
Potassium: 3.5 mmol/L (ref 3.5–5.1)
Sodium: 134 mmol/L — ABNORMAL LOW (ref 135–145)

## 2023-12-17 LAB — CBC WITH DIFFERENTIAL/PLATELET
Abs Immature Granulocytes: 0.06 10*3/uL (ref 0.00–0.07)
Basophils Absolute: 0 10*3/uL (ref 0.0–0.1)
Basophils Relative: 0 %
Eosinophils Absolute: 0 10*3/uL (ref 0.0–0.5)
Eosinophils Relative: 0 %
HCT: 49.8 % — ABNORMAL HIGH (ref 36.0–46.0)
Hemoglobin: 15.7 g/dL — ABNORMAL HIGH (ref 12.0–15.0)
Immature Granulocytes: 0 %
Lymphocytes Relative: 11 %
Lymphs Abs: 1.8 10*3/uL (ref 0.7–4.0)
MCH: 30.8 pg (ref 26.0–34.0)
MCHC: 31.5 g/dL (ref 30.0–36.0)
MCV: 97.8 fL (ref 80.0–100.0)
Monocytes Absolute: 1.8 10*3/uL — ABNORMAL HIGH (ref 0.1–1.0)
Monocytes Relative: 10 %
Neutro Abs: 13.2 10*3/uL — ABNORMAL HIGH (ref 1.7–7.7)
Neutrophils Relative %: 79 %
Platelets: 168 10*3/uL (ref 150–400)
RBC: 5.09 MIL/uL (ref 3.87–5.11)
RDW: 15.2 % (ref 11.5–15.5)
WBC: 16.8 10*3/uL — ABNORMAL HIGH (ref 4.0–10.5)
nRBC: 0 % (ref 0.0–0.2)

## 2023-12-17 LAB — GLUCOSE, CAPILLARY
Glucose-Capillary: 137 mg/dL — ABNORMAL HIGH (ref 70–99)
Glucose-Capillary: 142 mg/dL — ABNORMAL HIGH (ref 70–99)
Glucose-Capillary: 141 mg/dL — ABNORMAL HIGH (ref 70–99)
Glucose-Capillary: 116 mg/dL — ABNORMAL HIGH (ref 70–99)

## 2023-12-17 LAB — CBC
HCT: 48.4 % — ABNORMAL HIGH (ref 36.0–46.0)
Hemoglobin: 15.1 g/dL — ABNORMAL HIGH (ref 12.0–15.0)
MCH: 30.7 pg (ref 26.0–34.0)
MCHC: 31.2 g/dL (ref 30.0–36.0)
MCV: 98.4 fL (ref 80.0–100.0)
Platelets: 164 10*3/uL (ref 150–400)
RBC: 4.92 MIL/uL (ref 3.87–5.11)
RDW: 15.1 % (ref 11.5–15.5)
WBC: 14.1 10*3/uL — ABNORMAL HIGH (ref 4.0–10.5)
nRBC: 0 % (ref 0.0–0.2)

## 2023-12-17 LAB — I-STAT CG4 LACTIC ACID, ED: Lactic Acid, Venous: 1.9 mmol/L (ref 0.5–1.9)

## 2023-12-17 LAB — PROTIME-INR
INR: 1.6 — ABNORMAL HIGH (ref 0.8–1.2)
Prothrombin Time: 18.8 s — ABNORMAL HIGH (ref 11.4–15.2)

## 2023-12-17 LAB — URIC ACID: Uric Acid, Serum: 7.7 mg/dL — ABNORMAL HIGH (ref 2.5–7.1)

## 2023-12-17 LAB — HEPARIN LEVEL (UNFRACTIONATED): Heparin Unfractionated: >1.1 [IU]/mL — ABNORMAL HIGH (ref 0.30–0.70)

## 2023-12-17 LAB — APTT
aPTT: 62 s — ABNORMAL HIGH (ref 24–36)
aPTT: 71 s — ABNORMAL HIGH (ref 24–36)

## 2023-12-17 MED ORDER — HEPARIN (PORCINE) 25000 UT/250ML-% IV SOLN
1400.0000 [IU]/h | INTRAVENOUS | Status: DC
  Administered 2023-12-17: 1200 [IU]/h via INTRAVENOUS
  Administered 2023-12-17 – 2023-12-18 (×2): 1400 [IU]/h via INTRAVENOUS
  Filled 2023-12-17 (×4): qty 250

## 2023-12-17 MED ORDER — POTASSIUM CHLORIDE CRYS ER 10 MEQ PO TBCR
10.0000 meq | EXTENDED_RELEASE_TABLET | Freq: Two times a day (BID) | ORAL | Status: DC
  Administered 2023-12-17 – 2023-12-18 (×3): 10 meq via ORAL
  Filled 2023-12-17 (×3): qty 1

## 2023-12-17 MED ORDER — SODIUM CHLORIDE 0.9% FLUSH: 10.0000 mL | INTRAVENOUS | Status: DC | PRN

## 2023-12-17 MED ORDER — ICOSAPENT ETHYL 1 G PO CAPS
2.0000 g | ORAL_CAPSULE | Freq: Two times a day (BID) | ORAL | Status: DC
  Administered 2023-12-17 – 2023-12-27 (×21): 2 g via ORAL
  Filled 2023-12-17 (×21): qty 2

## 2023-12-17 MED ORDER — LEVOTHYROXINE SODIUM 50 MCG PO TABS
50.0000 ug | ORAL_TABLET | Freq: Every day | ORAL | Status: DC
  Administered 2023-12-17 – 2023-12-27 (×11): 50 ug via ORAL
  Filled 2023-12-17 (×11): qty 1

## 2023-12-17 MED ORDER — FENOFIBRATE 160 MG PO TABS
160.0000 mg | ORAL_TABLET | Freq: Every day | ORAL | Status: DC
  Administered 2023-12-17 – 2023-12-27 (×11): 160 mg via ORAL
  Filled 2023-12-17 (×11): qty 1

## 2023-12-17 MED ORDER — FUROSEMIDE 40 MG PO TABS
40.0000 mg | ORAL_TABLET | Freq: Two times a day (BID) | ORAL | Status: DC
  Filled 2023-12-17: qty 1

## 2023-12-17 MED ORDER — AMIODARONE HCL 200 MG PO TABS
200.0000 mg | ORAL_TABLET | Freq: Every day | ORAL | Status: DC
  Administered 2023-12-17 – 2023-12-27 (×11): 200 mg via ORAL
  Filled 2023-12-17 (×11): qty 1

## 2023-12-17 MED ORDER — SPIRONOLACTONE 25 MG PO TABS
25.0000 mg | ORAL_TABLET | Freq: Every day | ORAL | Status: DC
  Administered 2023-12-17: 25 mg via ORAL
  Filled 2023-12-17: qty 1

## 2023-12-17 MED ORDER — VANCOMYCIN HCL IN DEXTROSE 1-5 GM/200ML-% IV SOLN
1000.0000 mg | Freq: Once | INTRAVENOUS | Status: AC
  Administered 2023-12-17: 1000 mg via INTRAVENOUS
  Filled 2023-12-17: qty 200

## 2023-12-17 MED ORDER — MEXILETINE HCL 150 MG PO CAPS
300.0000 mg | ORAL_CAPSULE | Freq: Two times a day (BID) | ORAL | Status: DC
  Administered 2023-12-17 – 2023-12-27 (×21): 300 mg via ORAL
  Filled 2023-12-17 (×21): qty 2

## 2023-12-17 MED ORDER — ACETAMINOPHEN 500 MG PO TABS
1000.0000 mg | ORAL_TABLET | Freq: Three times a day (TID) | ORAL | Status: DC
  Administered 2023-12-17 – 2023-12-27 (×27): 1000 mg via ORAL
  Filled 2023-12-17 (×29): qty 2

## 2023-12-17 MED ORDER — VANCOMYCIN HCL 1750 MG/350ML IV SOLN: 1750.0000 mg | INTRAVENOUS | Status: DC

## 2023-12-17 MED ORDER — SODIUM CHLORIDE 0.9 % IV SOLN
2.0000 g | Freq: Two times a day (BID) | INTRAVENOUS | Status: DC
  Administered 2023-12-17 – 2023-12-18 (×2): 2 g via INTRAVENOUS
  Filled 2023-12-17 (×2): qty 12.5

## 2023-12-17 MED ORDER — INSULIN ASPART 100 UNIT/ML IJ SOLN
0.0000 [IU] | Freq: Three times a day (TID) | INTRAMUSCULAR | Status: DC
  Administered 2023-12-17 (×3): 1 [IU] via SUBCUTANEOUS
  Administered 2023-12-18: 2 [IU] via SUBCUTANEOUS
  Administered 2023-12-19 – 2023-12-21 (×3): 1 [IU] via SUBCUTANEOUS
  Administered 2023-12-24 – 2023-12-26 (×2): 2 [IU] via SUBCUTANEOUS

## 2023-12-17 MED ORDER — HEPARIN BOLUS VIA INFUSION
4000.0000 [IU] | Freq: Once | INTRAVENOUS | Status: AC
  Administered 2023-12-17: 4000 [IU] via INTRAVENOUS
  Filled 2023-12-17: qty 4000

## 2023-12-17 MED ORDER — METRONIDAZOLE 500 MG/100ML IV SOLN
500.0000 mg | Freq: Once | INTRAVENOUS | Status: AC
  Administered 2023-12-17: 500 mg via INTRAVENOUS
  Filled 2023-12-17: qty 100

## 2023-12-17 MED ORDER — MORPHINE SULFATE (PF) 4 MG/ML IV SOLN
4.0000 mg | Freq: Once | INTRAVENOUS | Status: AC
  Administered 2023-12-17: 4 mg via INTRAVENOUS
  Filled 2023-12-17: qty 1

## 2023-12-17 MED ORDER — OXYCODONE HCL 5 MG PO TABS
5.0000 mg | ORAL_TABLET | Freq: Four times a day (QID) | ORAL | Status: DC | PRN
  Administered 2023-12-17 – 2023-12-26 (×18): 5 mg via ORAL
  Filled 2023-12-17 (×21): qty 1

## 2023-12-17 MED ORDER — METHOCARBAMOL 500 MG PO TABS: 500.0000 mg | ORAL_TABLET | Freq: Four times a day (QID) | ORAL | Status: DC | PRN

## 2023-12-17 MED ORDER — METOPROLOL SUCCINATE ER 50 MG PO TB24
25.0000 mg | ORAL_TABLET | Freq: Every day | ORAL | Status: DC
  Administered 2023-12-17: 25 mg via ORAL
  Filled 2023-12-17: qty 1

## 2023-12-17 MED ORDER — SACUBITRIL-VALSARTAN 24-26 MG PO TABS
1.0000 | ORAL_TABLET | Freq: Two times a day (BID) | ORAL | Status: DC
  Administered 2023-12-17: 1 via ORAL
  Filled 2023-12-17: qty 1

## 2023-12-17 MED ORDER — INSULIN ASPART PROT & ASPART (70-30 MIX) 100 UNIT/ML ~~LOC~~ SUSP
10.0000 [IU] | Freq: Two times a day (BID) | SUBCUTANEOUS | Status: DC
  Administered 2023-12-17 – 2023-12-27 (×20): 10 [IU] via SUBCUTANEOUS
  Filled 2023-12-17: qty 10

## 2023-12-17 MED ORDER — PRAVASTATIN SODIUM 40 MG PO TABS
80.0000 mg | ORAL_TABLET | Freq: Every day | ORAL | Status: DC
  Administered 2023-12-17 – 2023-12-26 (×10): 80 mg via ORAL
  Filled 2023-12-17 (×11): qty 2

## 2023-12-17 MED ORDER — INSULIN LISPRO PROT & LISPRO (75-25 MIX) 100 UNIT/ML KWIKPEN: 10.0000 [IU] | PEN_INJECTOR | Freq: Two times a day (BID) | SUBCUTANEOUS | Status: DC

## 2023-12-17 MED ORDER — LACTATED RINGERS IV SOLN
INTRAVENOUS | Status: DC
Start: 2023-12-17 — End: 2023-12-17

## 2023-12-17 MED ORDER — SODIUM CHLORIDE 0.9 % IV SOLN
2.0000 g | Freq: Once | INTRAVENOUS | Status: AC
  Administered 2023-12-17: 2 g via INTRAVENOUS
  Filled 2023-12-17: qty 12.5

## 2023-12-17 NOTE — TOC Initial Note (Addendum)
 Transition of Care [facility]) - Initial/Assessment Note   Patient Details  Name: Heidi Stark MRN: [id number] Date of Birth: 1940-11-05  [facility]) CM/SW Contact:    Zenon Hilda, LCSW Phone Number: 12/17/2023, 10:19 AM  Clinical Narrative: Patient is from home with family. Patient is currently active with [facility] for HHPT/OT and will need new HH orders prior to discharge. CSW met with patient to discuss discharge planning. Per patient, she has been home from rehab about 1.5 weeks and Medicare stopped covering her rehab stay at [facility] after 16 days, so she prefers not to return to rehab if possible. TOC to follow.  Expected Discharge Plan: Home w Home Health Services Barriers to Discharge: Continued Medical Work up  Expected Discharge Plan and Services In-house Referral: Clinical Social Work Post Acute Care Choice: Home Health Living arrangements for the past 2 months: Single Family Home          DME Arranged: N/A DME Agency: NA  Prior Living Arrangements/Services Living arrangements for the past 2 months: Single Family Home Lives with:: Adult Children Patient language and need for interpreter reviewed:: Yes Do you feel safe going back to the place where you live?: Yes      Need for Family Participation in Patient Care: No (Comment) Care giver support system in place?: Yes (comment) Current home services: Home OT, Home PT (Active with [facility]) Criminal Activity/Legal Involvement Pertinent to Current Situation/Hospitalization: No - Comment as needed  Activities of Daily Living ADL Screening (condition at time of admission) Independently performs ADLs?: Yes (appropriate for developmental age) Is the patient deaf or have difficulty hearing?: No Does the patient have difficulty seeing, even when wearing glasses/contacts?: No Does the patient have difficulty concentrating, remembering, or making decisions?: No  Emotional Assessment Alcohol / Substance Use: Not  Applicable Psych Involvement: No (comment)  Admission diagnosis:  Hyponatremia [E87.1] Cellulitis of elbow [L03.119] Cellulitis of right elbow [L03.113] Elbow swelling, right [M25.421] Patient Active Problem List   Diagnosis Date Noted   Cellulitis of elbow 12/17/2023   Elbow swelling, right 12/17/2023   Olecranon bursitis 12/17/2023   Knee effusion, right 11/09/2023   Type 2 diabetes mellitus with hyperlipidemia (HCC) 05/16/2023   Hypothyroidism 05/16/2023   Cellulitis of left lower extremity 05/13/2023   Compression fracture of lumbar spine, non-traumatic, sequela 05/13/2023   Acute on chronic diastolic CHF (congestive heart failure) (HCC) 05/13/2023   Diabetic nephropathy (HCC) 10/13/2021   Hyperglycemia due to type 2 diabetes mellitus (HCC) 10/13/2021   Proteinuria 10/13/2021   Pure hypercholesterolemia 10/13/2021   COVID-19 04/15/2021   Respiratory failure with hypoxia (HCC) 04/15/2021   Systolic CHF (HCC) 04/15/2021   CKD (chronic kidney disease) stage 3, GFR 30-59 ml/min (HCC) 04/15/2021   Elevated troponin 04/15/2021   Acute on chronic systolic (congestive) heart failure (HCC) 09/14/2020   Wide-complex tachycardia 09/07/2020   Ventricular tachyarrhythmia (HCC) 09/07/2020   VT (ventricular tachycardia) (HCC) 09/07/2020   Persistent atrial fibrillation (HCC) 04/30/2018   Wound of left leg 10/01/2017   Cellulitis of right lower extremity 11/27/2016   Encounter for assessment for deep vein thrombosis (DVT) 11/14/2016   Hypokalemia 08/13/2016   Type 2 diabetes mellitus with hyperglycemia, with long-term current use of insulin  (HCC)    Atherosclerosis of aorta (HCC) 06/26/2016   Colitis 11/24/2014   Severe obesity (BMI >= 40) (HCC) 11/12/2013   Paroxysmal atrial fibrillation (HCC) 11/03/2013   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 [place] [address] Phone: [phone] Fax: [phone]  [facility] [address] [place] [address] Phone: [phone] Fax: [phone]  [id number] [place], [place] - [address] [place] [address] Phone: [phone] Fax: [phone]  Social Drivers of Health (SDOH) Social History: SDOH Screenings   Food Insecurity: No Food Insecurity (12/17/2023)  Housing: Low Risk  (12/17/2023)  Transportation Needs: No Transportation Needs (12/17/2023)  Utilities: Not At Risk (12/17/2023)  Alcohol Screen: Low Risk  (10/13/2021)  Depression (PHQ2-9): Low Risk  (09/14/2023)  Financial Resource Strain: Medium Risk (10/13/2021)  Physical Activity: Insufficiently Active (10/13/2021)  Social Connections: Moderately Integrated (12/17/2023)  Stress: No Stress Concern Present (10/13/2021)  Tobacco Use: Medium Risk (12/17/2023)   SDOH Interventions:    Readmission Risk Interventions    11/12/2023   10:09 AM  Readmission Risk Prevention Plan  Transportation Screening Complete  PCP or Specialist Appt within 3-5 Days Complete  HRI or Home Care Consult Complete  Social Work Consult for Recovery Care Planning/Counseling Complete  Palliative Care Screening Not Applicable  Medication Review Oceanographer) Complete

## 2023-12-17 NOTE — Sepsis Progress Note (Signed)
 Elink following code sepsis

## 2023-12-17 NOTE — Progress Notes (Signed)
 Pharmacy Antibiotic Note  Heidi Stark is a 83 y.o. female admitted on 12/16/2023 with possible septic bursitis.  Pharmacy has been consulted for Vancomycin  and Cefepime  dosing.  In ED patient received Vancomycin  1g IV, Cefepime  2gm IV and Metronidazole  500mg  IV x 1 dose each.  Plan: Cefepime  2g IV q12h Give an additional Vancomycin  1gm IV x 1 dose now for total loading dose of Vanc 2g then: Vancomycin  1750 mg IV Q 36 hrs. Goal AUC 400-550.  Expected AUC: 518.5  SCr used: 0.99 Follow renal function F/u culture results & sensitivities  Height: 5\' 6"  (167.6 cm) Weight: 115.3 kg (254 lb 3.1 oz) IBW/kg (Calculated) : 59.3  Temp (24hrs), Avg:98.2 F (36.8 C), Min:98.1 F (36.7 C), Max:98.3 F (36.8 C)  Recent Labs  Lab 12/16/23 2351 12/17/23 0005 12/17/23 0433  WBC 16.8*  --  14.1*  CREATININE 0.99  --  1.04*  LATICACIDVEN  --  1.9  --     Estimated Creatinine Clearance: 54.7 mL/min (A) (by C-G formula based on SCr of 1.04 mg/dL (H)).    Allergies  Allergen Reactions   Neomycin-Bacitracin Zn-Polymyx Rash   Sulfa Antibiotics Diarrhea and Nausea And Vomiting   Ciprofloxacin  Hives, Itching and Rash    Pt doesn't remember having any reaction to cipro    Spironolactone  Rash    Antimicrobials this admission: 26/1 Metronidazole  x 1 6/2 Cefepime  >>   6/2 Vancomycin  >>  Dose adjustments this admission:    Microbiology results: 6/1 BCx:      Thank you for allowing pharmacy to be a part of this patient's care.  Rulon Councilman, PharmD 12/17/2023 5:22 AM

## 2023-12-17 NOTE — Progress Notes (Signed)
 PHARMACY - ANTICOAGULATION CONSULT NOTE  Pharmacy Consult for Heparin  (PTA apixaban  on hold) Indication: atrial fibrillation  Allergies  Allergen Reactions   Neomycin-Bacitracin Zn-Polymyx Rash   Sulfa Antibiotics Diarrhea and Nausea And Vomiting   Ciprofloxacin  Hives, Itching and Rash    Pt doesn't remember having any reaction to cipro    Spironolactone  Rash    Patient Measurements:   Height = 66" Weight = 113.4 kg (11/26/23) Heparin  weight = 85.5 kg  Vital Signs: Temp: 98.2 F (36.8 C) (06/02 0404) Temp Source: Oral (06/02 0404) BP: 102/72 (06/02 0404) Pulse Rate: 64 (06/02 0404)  Labs: Recent Labs    12/16/23 2351  HGB 15.7*  HCT 49.8*  PLT 168  CREATININE 0.99    CrCl cannot be calculated (Unknown ideal weight.).   Medical History: Past Medical History:  Diagnosis Date   Arthritis    "hands, wrists" (11/07/2016)   CHF (congestive heart failure) (HCC)    Chronic lower back pain    Constipation    DVT (deep venous thrombosis) (HCC)    History of blood transfusion 1990   "related to OR"   History of kidney stones    Hyperlipidemia    Hypertension    Joint pain    Lower extremity edema    OSA on CPAP    Persistent atrial fibrillation (HCC)    Pneumonia    "several times" (11/07/2016)   Type II diabetes mellitus (HCC)     Medications:  PTA apixaban  5mg  po BID - LD 6/1 @ 0900  Assessment: 83 yr female with cellulitis and possible olecranon bursitis of the right elbox.  PMH significant for AFib and patient is on apixaban  PTA.  Apixaban  on hold and IV heparin  to begin Heparin  levels are falsely elevated due to DOAC use.  Will monitor heparin  therapy with aPTT until effects of recent DOAC use wears off and heparin  level & aPTT correlate.  Goal of Therapy:  Heparin  level 0.3-0.7 units/ml aPTT 66-102 seconds Monitor platelets by anticoagulation protocol: Yes   Plan:  Heparin  4000 unit IV bolus x 1 Heparin  gtt @ 1200 units/hr Check aPTT and Heparin   level 8 hr after heparin  started Daily heparin  level & CBC  Navjot Pilgrim, Gabriel John, PharmD 12/17/2023,4:46 AM

## 2023-12-17 NOTE — Progress Notes (Signed)
 PHARMACY - ANTICOAGULATION CONSULT NOTE  Pharmacy Consult for Heparin  (PTA apixaban  on hold) Indication: atrial fibrillation  Allergies  Allergen Reactions   Neomycin-Bacitracin Zn-Polymyx Rash   Sulfa Antibiotics Diarrhea and Nausea And Vomiting   Ciprofloxacin  Hives, Itching and Rash    Pt doesn't remember having any reaction to cipro    Spironolactone  Rash    Patient Measurements: Height: 5\' 6"  (167.6 cm) Weight: 115.3 kg (254 lb 3.1 oz) IBW/kg (Calculated) : 59.3 HEPARIN  DW (KG): 86.5  Vital Signs: Temp: 97.7 F (36.5 C) (06/02 2149) Temp Source: Oral (06/02 2149) BP: 111/60 (06/02 2155) Pulse Rate: 61 (06/02 2155)  Labs: Recent Labs    12/16/23 2351 12/17/23 0433 12/17/23 1253 12/17/23 2139  HGB 15.7* 15.1*  --   --   HCT 49.8* 48.4*  --   --   PLT 168 164  --   --   APTT  --   --  62* 71*  LABPROT  --  18.8*  --   --   INR  --  1.6*  --   --   HEPARINUNFRC  --   --  >1.10*  --   CREATININE 0.99 1.04*  --   --     Estimated Creatinine Clearance: 54.7 mL/min (A) (by C-G formula based on SCr of 1.04 mg/dL (H)).   Medical History: Past Medical History:  Diagnosis Date   Arthritis    "hands, wrists" (11/07/2016)   CHF (congestive heart failure) (HCC)    Chronic lower back pain    Constipation    DVT (deep venous thrombosis) (HCC)    History of blood transfusion 1990   "related to OR"   History of kidney stones    Hyperlipidemia    Hypertension    Joint pain    Lower extremity edema    OSA on CPAP    Persistent atrial fibrillation (HCC)    Pneumonia    "several times" (11/07/2016)   Type II diabetes mellitus (HCC)     Medications:  PTA apixaban  5mg  PO BID - LD 6/1 @ 09:00  Assessment: Pt is an 68 yoF admitted with cellulitis, concern for possible  olecranon bursitis. Pt chronically anticoagulated with apixaban  for atrial fibrillation. Pharmacy consulted to dose heparin  while apixaban  on hold for possible procedures.   Today, 12/17/23 aPTT =  62 seconds is slightly subtherapeutic on heparin  infusion of 1200 units/hr Heparin  level >1.1 (falsely elevated due to recent apixaban ) CBC: WNL No bleeding reported 21:39 aPTT = 71 sec (therapeutic) with heparin  gtt @ 1400 units/hr No complications of therapy reported  Goal of Therapy:  Heparin  level 0.3-0.7 units/ml aPTT 66-102 seconds Monitor platelets by anticoagulation protocol: Yes   Plan:  Continue heparin  infusion @ 1400 units/hr Recheck aPTT with AM labs to confirm therapeutic dose CBC, heparin  level, aPTT daily Once heparin  level and aPTT correlate, can monitor using heparin  level only Monitor for signs of bleeding  If pt undergoes invasive procedures or surgery, will need orders for when to hold heparin  peri-operatively.   Rulon Councilman, PharmD 12/17/23 10:48 PM

## 2023-12-17 NOTE — Progress Notes (Addendum)
 PROGRESS NOTE  Heidi Stark  DOB: [birth year]  PCP: Estill Hemming, [place] ZOX:[id number]  DOA: 12/16/2023  LOS: 0 days  Hospital Day: 2  Brief narrative: Heidi Stark is a 83 y.o. female with PMH significant for morbid obesity, DM2, HTN, HLD, OSA on CPAP, CHF, A-fib, h/o VT s/p ICD, CKD, hypothyroidism. Lives at home, uses a walker. 6/1, patient presented to the ED with complaint of sudden onset of pain, swelling of the right elbow, rapidly progressing in 1 to 2 days.  Denies any trauma but states she supports her weight with her elbow while getting in and out of the recliner and could have rubbed the elbow in the process. The day prior on 5/31, patient went to see orthopedics as an outpatient and was started on a course of cephalexin  but with worsening swelling, she came to the ED.  In the ED, patient was afebrile, hemodynamically stable Initial labs with WBC count 16.8, hemoglobin 15.7, platelet normal, sodium 133, potassium 3.7, renal function normal, lactic acid level normal. Clinically suspected to have right elbow cellulitis versus olecranon bursitis. Blood culture collected Patient was started on broad-spectrum IV antibiotics Admitted to [facility] Orthopedics consulted  Subjective: Patient was seen and examined this morning.  Pleasant elderly Caucasian female. Continues to have pain and swelling in the right elbow.  Range of movement seems preserved. This afternoon, blood pressure is running low.  It was 74/38 at some point and on repeat, systolic was over 100.  Assessment and plan: Right elbow cellulitis/olecranon bursitis Was seen by orthopedics in the office.  Unclear if imaging was obtained at the time.  It seems no imaging was obtained in the ED Blood culture sent from ED Started on IV cefepime  Per admitting physician Dr. Daldorf from [facility] will be seeing her   Chronic CHF HTN PTA meds- Toprol , Lasix , Entresto , eplerenone , Farxiga  All of these were resumed  on admission. This afternoon, blood pressure is running low.  It was 74/38 at some point and on repeat, systolic was over 100. I suspect the patient was probably not taking all above medicines at home as she claimed. I would stop Lasix , Entresto , Aldactone , Farxiga .  Continue Toprol  only.  Continue to monitor blood pressure.  Obtain TTE  H/o A-fib/a flutter H/o V. Tach s/p AICD Currently rate controlled on amiodarone , Toprol  Chronically anticoagulated on Eliquis . On admission, switched from Eliquis  to heparin  drip for possible procedure.  CKD 3a Creatinine at baseline Recent Labs    10/11/23 1552 11/08/23 1651 11/09/23 1253 11/10/23 0615 11/11/23 0740 11/12/23 0529 11/13/23 0531 11/14/23 0620 12/16/23 2351 12/17/23 0433  BUN 22 21 18 21  25* 27* 24* 19 18 19   CREATININE 1.46* 1.28* 1.07* 1.25* 1.28* 1.08* 1.04* 0.60 0.99 1.04*   Type 2 diabetes mellitus A1c 6.5 on 09/06/2023 PTA meds-NovoLog  75/25 sliding scale but not on consistent dose  Currently on NovoLog  70/30 at 10 units twice daily along with SSI/Accu-Cheks Recent Labs  Lab 12/17/23 0744 12/17/23 1138  GLUCAP 137* 142*   Hypothyroidism Continue Synthroid   Anxiety/depression Zoloft  PRN  Morbid Obesity  Body mass index is 41.03 kg/m. Patient has been advised to make an attempt to improve diet and exercise patterns to aid in weight loss.  OSA On nocturnal CPAP  Impaired mobility Lives at home.  Uses a walker.  Will benefit from PT eval   Goals of care   Code Status: Full Code     DVT prophylaxis: On heparin  drip  Antimicrobials: IV cefepime  Fluid: None Consultants: Orthopedics Family Communication: None at bedside  Status: Observation Level of care:  Telemetry   Patient is from: Home Needs to continue in-hospital care: Pending orthopedic eval Anticipated d/c to: Pending clinical course   Diet:  Diet Order             Diet heart healthy/carb modified Room service appropriate? Yes;  Fluid consistency: Thin; Fluid restriction: 1500 mL Fluid  Diet effective now                   Scheduled Meds:  acetaminophen   1,000 mg Oral TID   amiodarone   200 mg Oral Daily   fenofibrate   160 mg Oral Daily   icosapent  Ethyl  2 g Oral BID   insulin  aspart  0-9 Units Subcutaneous TID WC   insulin  aspart protamine - aspart  10 Units Subcutaneous BID WC   levothyroxine   50 mcg Oral Daily   metoprolol  succinate  25 mg Oral QHS   mexiletine  300 mg Oral BID   potassium chloride   10 mEq Oral BID   pravastatin   80 mg Oral q1800    PRN meds: oxyCODONE , sodium chloride  flush   Infusions:   ceFEPime  (MAXIPIME ) IV     heparin  1,200 Units/hr (12/17/23 1101)   [START ON 12/18/2023] vancomycin       Antimicrobials: Anti-infectives (From admission, onward)    Start     Dose/Rate Route Frequency Ordered Stop   12/18/23 1600  vancomycin  (VANCOREADY) IVPB 1750 mg/350 mL        1,750 mg 175 mL/hr over 120 Minutes Intravenous Every 36 hours 12/17/23 0522     12/17/23 1500  ceFEPIme  (MAXIPIME ) 2 g in sodium chloride  0.9 % 100 mL IVPB        2 g 200 mL/hr over 30 Minutes Intravenous Every 12 hours 12/17/23 0510     12/17/23 0615  vancomycin  (VANCOCIN ) IVPB 1000 mg/200 mL premix        1,000 mg 200 mL/hr over 60 Minutes Intravenous  Once 12/17/23 0521 12/17/23 0650   12/17/23 0230  ceFEPIme  (MAXIPIME ) 2 g in sodium chloride  0.9 % 100 mL IVPB        2 g 200 mL/hr over 30 Minutes Intravenous  Once 12/17/23 0229 12/17/23 0400   12/17/23 0230  metroNIDAZOLE  (FLAGYL ) IVPB 500 mg        500 mg 100 mL/hr over 60 Minutes Intravenous  Once 12/17/23 0229 12/17/23 0400   12/17/23 0230  vancomycin  (VANCOCIN ) IVPB 1000 mg/200 mL premix        1,000 mg 200 mL/hr over 60 Minutes Intravenous  Once 12/17/23 0229 12/17/23 0359       Objective: Vitals:   12/17/23 1308 12/17/23 1345  BP: (!) 74/38 (!) 102/30  Pulse: (!) 57 (!) 54  Resp: 16   Temp: (!) 97.5 F (36.4 C)   SpO2: 95%      Intake/Output Summary (Last 24 hours) at 12/17/2023 1350 Last data filed at 12/17/2023 1101 Gross per 24 hour  Intake 1293.3 ml  Output --  Net 1293.3 ml   Filed Weights   12/17/23 0505  Weight: 115.3 kg   Weight change:  Body mass index is 41.03 kg/m.   Physical Exam: General exam: Pleasant, elderly Caucasian female.  Propped up in bed.  Morbidly obese Skin: No rashes, lesions or ulcers. HEENT: Atraumatic, normocephalic, no obvious bleeding Lungs: Clear to auscultation bilaterally, on low-flow oxygen CVS: S1, S2, no murmur,  GI/Abd: Soft, nontender, distended from obesity, bowel sound present,   CNS: Alert, awake, oriented x 3 Psychiatry: Mood appropriate,  Extremities: No pedal edema, no calf tenderness, right elbow with redness, swelling, pain on touch over the olecranon.  An area of scab noted but no open wound  Data Review: I have personally reviewed the laboratory data and studies available.  F/u labs  Unresulted Labs (From admission, onward)     Start     Ordered   12/18/23 0500  CBC  Daily,   R      12/17/23 0450   12/18/23 0500  Creatinine, serum  Tomorrow morning,   R       Question:  Specimen collection method  Answer:  Lab=Lab collect   12/17/23 1314   12/16/23 2312  Urinalysis, w/ Reflex to Culture (Infection Suspected) -Urine, Clean Catch  (Undifferentiated presentation (screening labs and basic nursing orders))  ONCE - URGENT,   URGENT       Question:  Specimen Source  Answer:  Urine, Clean Catch   12/16/23 2311   12/16/23 2311  Blood Culture (routine x 2)  (Undifferentiated presentation (screening labs and basic nursing orders))  BLOOD CULTURE X 2,   STAT      12/16/23 2311            Admission date and time: 12/16/2023 10:47 PM    Signed, Hoyt Macleod, MD [facility] 12/17/2023

## 2023-12-17 NOTE — Evaluation (Signed)
 Physical Therapy Evaluation Patient Details Name: Heidi Stark MRN: [id number] DOB: 23-Sep-1940 Today's Date: 12/17/2023  History of Present Illness  Heidi Stark is a 83 y.o. female presents to the hospital with Rt elbow swelling and pain. Admitted for Rt elbow cellulitis to be treated with IV antibiotics. PMH significant of with past medical history of HTN, DM 2, HLD, P A-fib on Eliquis , splenic and renal infarcts, congestive heart failure with recovered ejection fraction 65%, hypothyroidism, history of VT.   Clinical Impression  Heidi Stark is 83 y.o. female admitted with above HPI and diagnosis. Patient is currently limited by functional impairments below (see PT problem list). Patient lives with daughter and has recently returned home after ST rehab. She currently requires mod assist for bed mob and mod assist from elevated surface for sit<>stand with RW. Patient will benefit from continued skilled PT interventions to address impairments and progress independence with mobility, recommending continued inpatient follow up therapy, <3 hours/day, pt may prefer to return home with assist from family. Acute PT will follow and progress as able.         If plan is discharge home, recommend the following: Two people to help with walking and/or transfers;A lot of help with bathing/dressing/bathroom;Assistance with cooking/housework;Direct supervision/assist for medications management;Assist for transportation;Help with stairs or ramp for entrance   Can travel by private vehicle   No    Equipment Recommendations None recommended by PT  Recommendations for Other Services       Functional Status Assessment Patient has had a recent decline in their functional status and/or demonstrates limited ability to make significant improvements in function in a reasonable and predictable amount of time     Precautions / Restrictions Precautions Precautions: Fall Recall of Precautions/Restrictions:  Intact Restrictions Weight Bearing Restrictions Per Provider Order: No      Mobility  Bed Mobility Overal bed mobility: Needs Assistance Bed Mobility: Supine to Sit, Sit to Supine     Supine to sit: Mod assist, Used rails, HOB elevated Sit to supine: Mod assist, Used rails        Transfers Overall transfer level: Needs assistance Equipment used: Rolling walker (2 wheels) Transfers: Sit to/from Stand Sit to Stand: Mod assist, From elevated surface                Ambulation/Gait                  Stairs            Wheelchair Mobility     Tilt Bed    Modified Rankin (Stroke Patients Only)       Balance Overall balance assessment: Needs assistance Sitting-balance support: Feet supported, Bilateral upper extremity supported Sitting balance-Leahy Scale: Fair     Standing balance support: Bilateral upper extremity supported, During functional activity, Reliant on assistive device for balance Standing balance-Leahy Scale: Poor                               Pertinent Vitals/Pain      Home Living Family/patient expects to be discharged to:: Private residence Living Arrangements: Children Available Help at Discharge: Family;Available 24 hours/day Type of Home: Other(Comment) Home Access: Stairs to enter;Ramped entrance Entrance Stairs-Rails: None Entrance Stairs-Number of Steps: 3   Home Layout: One level Home Equipment: Agricultural consultant (2 wheels);Rollator (4 wheels);BSC/3in1;Lift chair;Grab bars - toilet;Wheelchair - manual Additional Comments: BSC is in the shower seat  Prior Function Prior Level of Function : Needs assist       Physical Assist : ADLs (physical);Mobility (physical) Mobility (physical): Transfers;Gait;Stairs ADLs (physical): Toileting;Bathing;Dressing;Grooming Mobility Comments: sleeps in lift chair, RW for transfers and limited mobility in home since return from rehab. pt reports transport chair for  community (states it can't go up/down ramp?) ADLs Comments: Spouse passed around labor day, pt remains emotional regarding loss, daughter stays with her, since rehab assist for all mobility and ADL's.     Extremity/Trunk Assessment   Upper Extremity Assessment Upper Extremity Assessment: Generalized weakness;RUE deficits/detail RUE Deficits / Details: errythema and edema at Rt elbow    Lower Extremity Assessment Lower Extremity Assessment: Generalized weakness    Cervical / Trunk Assessment Cervical / Trunk Assessment: Other exceptions Cervical / Trunk Exceptions: habitus  Communication   Communication Communication: No apparent difficulties    Cognition Arousal: Alert Behavior During Therapy: WFL for tasks assessed/performed (emotional about situation)   PT - Cognitive impairments: No apparent impairments                       PT - Cognition Comments: no obvious impairments, pt emotional/tearful regarding situation and is feeling down Following commands: Intact       Cueing Cueing Techniques: Verbal cues     General Comments      Exercises     Assessment/Plan    PT Assessment Patient needs continued PT services  PT Problem List Decreased strength;Decreased safety awareness;Decreased knowledge of use of DME;Decreased coordination;Decreased mobility;Decreased balance;Decreased activity tolerance;Decreased range of motion;Obesity;Pain       PT Treatment Interventions DME instruction;Cognitive remediation;Neuromuscular re-education;Balance training;Therapeutic exercise;Therapeutic activities;Functional mobility training;Stair training;Gait training;Patient/family education    PT Goals (Current goals can be found in the Care Plan section)  Acute Rehab PT Goals Patient Stated Goal: get well enough to go home PT Goal Formulation: With patient Time For Goal Achievement: 12/31/23 Potential to Achieve Goals: Good    Frequency Min 2X/week      Co-evaluation               AM-PAC PT "6 Clicks" Mobility  Outcome Measure Help needed turning from your back to your side while in a flat bed without using bedrails?: A Lot Help needed moving from lying on your back to sitting on the side of a flat bed without using bedrails?: A Lot Help needed moving to and from a bed to a chair (including a wheelchair)?: A Lot Help needed standing up from a chair using your arms (e.g., wheelchair or bedside chair)?: A Lot Help needed to walk in hospital room?: Total Help needed climbing 3-5 steps with a railing? : Total 6 Click Score: 10    End of Session Equipment Utilized During Treatment: Gait belt Activity Tolerance: Patient tolerated treatment well;Patient limited by pain Patient left: in bed;with call bell/phone within reach;with bed alarm set;Other (comment) (SW in room) Nurse Communication: Mobility status PT Visit Diagnosis: Other abnormalities of gait and mobility (R26.89);Muscle weakness (generalized) (M62.81);Difficulty in walking, not elsewhere classified (R26.2);Pain Pain - Right/Left: Right Pain - part of body: Arm (elbow)    Time: 9147-8295 PT Time Calculation (min) (ACUTE ONLY): 38 min   Charges:   PT Evaluation $PT Eval Moderate Complexity: 1 Mod PT Treatments $Therapeutic Activity: 23-37 mins PT General Charges $$ ACUTE PT VISIT: 1 Visit         Tish Forge, DPT Acute Rehabilitation Services Office [phone]  12/17/23 2:52 PM

## 2023-12-17 NOTE — Care Management Obs Status (Signed)
 MEDICARE OBSERVATION STATUS NOTIFICATION   Patient Details  Name: NATESHA HASSEY MRN: [id number] Date of Birth: 03-Feb-1941   Medicare Observation Status Notification Given:  Yes    Zenon Hilda, LCSW 12/17/2023, 2:39 PM

## 2023-12-17 NOTE — Progress Notes (Signed)
 PHARMACY - ANTICOAGULATION CONSULT NOTE  Pharmacy Consult for Heparin  (PTA apixaban  on hold) Indication: atrial fibrillation  Allergies  Allergen Reactions   Neomycin-Bacitracin Zn-Polymyx Rash   Sulfa Antibiotics Diarrhea and Nausea And Vomiting   Ciprofloxacin  Hives, Itching and Rash    Pt doesn't remember having any reaction to cipro    Spironolactone  Rash    Patient Measurements: Height: 5\' 6"  (167.6 cm) Weight: 115.3 kg (254 lb 3.1 oz) IBW/kg (Calculated) : 59.3 HEPARIN  DW (KG): 86.5  Vital Signs: Temp: 97.5 F (36.4 C) (06/02 1308) Temp Source: Oral (06/02 1308) BP: 74/38 (06/02 1308) Pulse Rate: 57 (06/02 1308)  Labs: Recent Labs    12/16/23 2351 12/17/23 0433  HGB 15.7* 15.1*  HCT 49.8* 48.4*  PLT 168 164  LABPROT  --  18.8*  INR  --  1.6*  CREATININE 0.99 1.04*    Estimated Creatinine Clearance: 54.7 mL/min (A) (by C-G formula based on SCr of 1.04 mg/dL (H)).   Medical History: Past Medical History:  Diagnosis Date   Arthritis    "hands, wrists" (11/07/2016)   CHF (congestive heart failure) (HCC)    Chronic lower back pain    Constipation    DVT (deep venous thrombosis) (HCC)    History of blood transfusion 1990   "related to OR"   History of kidney stones    Hyperlipidemia    Hypertension    Joint pain    Lower extremity edema    OSA on CPAP    Persistent atrial fibrillation (HCC)    Pneumonia    "several times" (11/07/2016)   Type II diabetes mellitus (HCC)     Medications:  PTA apixaban  5mg  PO BID - LD 6/1 @ 09:00  Assessment: Pt is an 47 yoF admitted with cellulitis, concern for possible  olecranon bursitis. Pt chronically anticoagulated with apixaban  for atrial fibrillation. Pharmacy consulted to dose heparin  while apixaban  on hold for possible procedures.   Today, 12/17/23 aPTT = 62 seconds is slightly subtherapeutic on heparin  infusion of 1200 units/hr Heparin  level >1.1 (falsely elevated due to recent apixaban ) CBC: WNL No  bleeding reported  Goal of Therapy:  Heparin  level 0.3-0.7 units/ml aPTT 66-102 seconds Monitor platelets by anticoagulation protocol: Yes   Plan:  Increase heparin  infusion to 1400 units/hr Check aPTT 8 hours after rate change CBC, heparin  level, aPTT daily Once heparin  level and aPTT correlate, can monitor using heparin  level only Monitor for signs of bleeding  If pt undergoes invasive procedures or surgery, will need orders for when to hold heparin  peri-operatively.   Shireen Dory, PharmD 12/17/23 1:18 PM

## 2023-12-17 NOTE — H&P (Signed)
 History and Physical    Heidi Stark HYQ:[id number] DOB: 01/30/1941 DOA: 12/16/2023  Patient coming from: Home.  Chief Complaint: Right elbow swelling and pain.  HPI: Heidi Stark is a 83 y.o. female with history of diabetes mellitus type 2, chronic CHF, atrial fibrillation, history of VT status post ICD placement, chronic kidney disease stage III, sleep apnea, hypothyroidism presents to the ER with complaints of having sudden onset of pain and swelling of the right elbow since yesterday morning.  Denies any trauma or insect bites.  Denies any fever or chills.  Has not had any prior history of gout.  Patient was recently admitted in April for cellulitis of the left lower extremity.  Patient had gone to her orthopedic office and was placed on cephalexin  yesterday.  Due to worsening swelling patient presents to the ER.  ED Course: In the ER patient was afebrile.  Lactic acid was 1.9 WBC was 16.8.  Cultures were obtained and started on empiric antibiotics concerning for right elbow cellulitis versus olecranon bursitis.  Review of Systems: As per HPI, rest all negative.   Past Medical History:  Diagnosis Date   Arthritis    "hands, wrists" (11/07/2016)   CHF (congestive heart failure) (HCC)    Chronic lower back pain    Constipation    DVT (deep venous thrombosis) (HCC)    History of blood transfusion 1990   "related to OR"   History of kidney stones    Hyperlipidemia    Hypertension    Joint pain    Lower extremity edema    OSA on CPAP    Persistent atrial fibrillation (HCC)    Pneumonia    "several times" (11/07/2016)   Type II diabetes mellitus (HCC)     Past Surgical History:  Procedure Laterality Date   ATRIAL FIBRILLATION ABLATION N/A 11/07/2016   Procedure: Atrial Fibrillation Ablation;  Surgeon: Will Cortland Ding, MD;  Location: [facility] INVASIVE CV LAB;  Service: Cardiovascular;  Laterality: N/A;   BACK SURGERY     CARDIAC CATHETERIZATION     CARDIOVERSION N/A 08/11/2016    Procedure: CARDIOVERSION;  Surgeon: Darlis Eisenmenger, MD;  Location: [facility] ENDOSCOPY;  Service: Cardiovascular;  Laterality: N/A;   CARDIOVERSION N/A 12/03/2017   Procedure: CARDIOVERSION;  Surgeon: Darlis Eisenmenger, MD;  Location: [facility] ENDOSCOPY;  Service: Cardiovascular;  Laterality: N/A;   CARDIOVERSION N/A 05/02/2018   Procedure: CARDIOVERSION;  Surgeon: Hazle Lites, MD;  Location: [facility] ENDOSCOPY;  Service: Cardiovascular;  Laterality: N/A;   CARDIOVERSION N/A 06/23/2020   Procedure: CARDIOVERSION;  Surgeon: Sonny Dust, MD;  Location: [facility] ENDOSCOPY;  Service: Cardiovascular;  Laterality: N/A;   CARDIOVERSION N/A 10/11/2020   Procedure: CARDIOVERSION;  Surgeon: Darlis Eisenmenger, MD;  Location: [facility] ENDOSCOPY;  Service: Cardiovascular;  Laterality: N/A;   CARPAL TUNNEL RELEASE     CATARACT EXTRACTION W/ INTRAOCULAR LENS  IMPLANT, BILATERAL Bilateral    COLON SURGERY  1990   vein graft and colon repair after nicked artery with back surgert   COLONOSCOPY     CORONARY ANGIOGRAPHY N/A 09/08/2020   Procedure: CORONARY ANGIOGRAPHY;  Surgeon: Darlis Eisenmenger, MD;  Location: [facility] INVASIVE CV LAB;  Service: Cardiovascular;  Laterality: N/A;   CYSTECTOMY     between bladder and kidneys   GANGLION CYST EXCISION Left    ICD IMPLANT N/A 09/15/2020   Procedure: ICD IMPLANT;  Surgeon: Lei Pump, MD;  Location: [facility] INVASIVE CV LAB;  Service: Cardiovascular;  Laterality: N/A;  KNEE CARTILAGE SURGERY Right 1960s   LUMBAR DISC SURGERY  1990   REPAIR ILIAC ARTERY  1990   vein graft and colon repair after nicked artery with back surgert   TEE WITHOUT CARDIOVERSION N/A 10/31/2013   Procedure: TRANSESOPHAGEAL ECHOCARDIOGRAM (TEE);  Surgeon: Darlis Eisenmenger, MD;  Location: [facility] ENDOSCOPY;  Service: Cardiovascular;  Laterality: N/A;   TUBAL LIGATION       reports that she quit smoking about 3 years ago. Her smoking use included cigarettes. She started smoking about 53 years ago. She has a 50 pack-year  smoking history. She has never used smokeless tobacco. She reports that she does not drink alcohol and does not use drugs.  Allergies  Allergen Reactions   Neomycin-Bacitracin Zn-Polymyx Rash   Sulfa Antibiotics Diarrhea and Nausea And Vomiting   Ciprofloxacin  Hives, Itching and Rash    Pt doesn't remember having any reaction to cipro    Spironolactone  Rash    Family History  Problem Relation Age of Onset   Diabetes Mother    Hypertension Mother    Cancer Mother    Obesity Mother    Diabetes Father    Obesity Father    Diabetes Other    Melanoma Other    Factor V Leiden deficiency Daughter     Prior to Admission medications   Medication Sig Start Date End Date Taking? Authorizing Provider  acetaminophen  (TYLENOL ) 500 MG tablet Take 500 mg by mouth every 6 (six) hours as needed for moderate pain (pain score 4-6) or headache.   Yes [provider]  amiodarone  (PACERONE ) 200 MG tablet TAKE 1 TABLET BY MOUTH EVERY DAY 08/23/23  Yes McLean, Dalton S, MD  cefadroxil (DURICEF) 500 MG capsule Take 500 mg by mouth 3 (three) times daily. 12/16/23  Yes [provider]  ELIQUIS  5 MG TABS tablet TAKE 1 TABLET BY MOUTH TWICE A DAY 06/12/23  Yes Darlis Eisenmenger, MD  ENTRESTO  24-26 MG Take 1 tablet by mouth 2 (two) times daily. 10/15/23  Yes [provider]  eplerenone  (INSPRA ) 25 MG tablet TAKE 1 TABLET BY MOUTH EVERY DAY 02/21/23  Yes Darlis Eisenmenger, MD  fenofibrate  (TRICOR ) 145 MG tablet TAKE 1 TABLET BY MOUTH EVERY DAY 01/22/23  Yes Darlis Eisenmenger, MD  furosemide  (LASIX ) 40 MG tablet Take 40 mg by mouth 2 (two) times daily. Take 2 tablets by mouth twice daily 11/15/23  Yes [provider]  gabapentin  (NEURONTIN ) 300 MG capsule Take 300 mg by mouth 2 (two) times daily as needed (pain). 07/31/22  Yes [provider]  HUMALOG  MIX 75/25 KWIKPEN (75-25) 100 UNIT/ML KwikPen Take 40 units with morning meal and 30 units with evening meal Patient taking  differently: Inject 30 Units into the skin 2 (two) times daily as needed (hyperglycemia). 05/31/22  Yes Roel Clarity R, DO  levothyroxine  (SYNTHROID ) 50 MCG tablet Take 1 tablet (50 mcg total) by mouth daily. 12/03/23  Yes Roel Clarity R, DO  metoprolol  succinate (TOPROL -XL) 25 MG 24 hr tablet TAKE 1 TABLET BY MOUTH EVERYDAY AT BEDTIME 04/23/23  Yes Diaz, Alma L, NP  mexiletine (MEXITIL ) 150 MG capsule TAKE 2 CAPSULES (300 MG TOTAL) BY MOUTH 2 (TWO) TIMES DAILY. 05/10/23  Yes Camnitz, Will Gaylyn Keas, MD  MOUNJARO 2.5 MG/0.5ML Pen Inject 2.5 mg into the skin once a week. Thursdays   Yes [provider]  Multiple Vitamin (MULTIVITAMIN WITH MINERALS) TABS tablet Take 1 tablet by mouth daily.   Yes [provider]  nicotine  (NICODERM CQ  - DOSED IN MG/24 HOURS) 21 mg/24hr patch Place 1 patch (21 mg total) onto the skin daily as needed (Nicotine  withdrawal). 11/14/23  Yes Pudota, Mordecai Applebaum, MD  Pitavastatin  Calcium  4 MG TABS TAKE 1 TABLET BY MOUTH EVERY DAY 10/29/23  Yes Darlis Eisenmenger, MD  potassium chloride  (MICRO-K ) 10 MEQ CR capsule Take 10 mEq by mouth 2 (two) times daily. 11/14/23  Yes [provider]  sertraline  (ZOLOFT ) 25 MG tablet TAKE 1 TABLET (25 MG TOTAL) BY MOUTH DAILY. 10/15/23  Yes Roel Clarity R, DO  VASCEPA  1 g capsule TAKE 2 CAPSULES BY MOUTH TWICE A DAY 10/10/23  Yes Darlis Eisenmenger, MD  BD PEN NEEDLE NANO 2ND GEN 32G X 4 MM MISC See admin instructions. 09/24/21   [provider]  dapagliflozin  propanediol (FARXIGA ) 10 MG TABS tablet TAKE 1 TABLET BY MOUTH EVERY DAY BEFORE BREAKFAST 05/31/22   Crecencio Dodge, Yvonne R, DO  ONETOUCH VERIO test strip 1 each 3 (three) times daily. 10/09/21   [provider]    Physical Exam: Constitutional: Moderately built and nourished. Vitals:   12/16/23 2345 12/17/23 0030 12/17/23 0321 12/17/23 0404  BP:  107/74  102/72  Pulse: 75 63  64  Resp: 16 16  17   Temp:   98.3 F (36.8 C) 98.2 F  (36.8 C)  TempSrc:   Oral Oral  SpO2: 93% 94%  92%   Eyes: Anicteric no pallor. ENMT: No discharge from the ears/nose or mouth. Neck: No mass felt.  No neck rigidity. Respiratory: No rhonchi or crepitations. Cardiovascular: S1-S2 heard. Abdomen: Soft nontender bowel sound present. Musculoskeletal: Swelling of the right elbow with mild tenderness erythema concerning for olecranon bursitis.  No restriction of movement. Skin: Erythema of the right elbow. Neurologic: Alert awake oriented time place and person.  Moves all extremities. Psychiatric: Appears normal.  Normal affect.   Labs on Admission: I have personally reviewed following labs and imaging studies  CBC: Recent Labs  Lab 12/16/23 2351  WBC 16.8*  NEUTROABS 13.2*  HGB 15.7*  HCT 49.8*  MCV 97.8  PLT 168   Basic Metabolic Panel: Recent Labs  Lab 12/16/23 2351  NA 133*  K 3.7  CL 97*  CO2 24  GLUCOSE 151*  BUN 18  CREATININE 0.99  CALCIUM  7.9*   GFR: CrCl cannot be calculated (Unknown ideal weight.). Liver Function Tests: Recent Labs  Lab 12/16/23 2351  AST 38  ALT 16  ALKPHOS 52  BILITOT 1.2  PROT 6.9  ALBUMIN 3.5   No results for input(s): "LIPASE", "AMYLASE" in the last 168 hours. No results for input(s): "AMMONIA" in the last 168 hours. Coagulation Profile: No results for input(s): "INR", "PROTIME" in the last 168 hours. Cardiac Enzymes: No results for input(s): "CKTOTAL", "CKMB", "CKMBINDEX", "TROPONINI" in the last 168 hours. BNP (last 3 results) No results for input(s): "PROBNP" in the last 8760 hours. HbA1C: No results for input(s): "HGBA1C" in the last 72 hours. CBG: No results for input(s): "GLUCAP" in the last 168 hours. Lipid Profile: No results for input(s): "CHOL", "HDL", "LDLCALC", "TRIG", "CHOLHDL", "LDLDIRECT" in the last 72 hours. Thyroid  Function Tests: No results for input(s): "TSH", "T4TOTAL", "FREET4", "T3FREE", "THYROIDAB" in the last 72 hours. Anemia Panel: No  results for input(s): "VITAMINB12", "FOLATE", "FERRITIN", "TIBC", "IRON", "RETICCTPCT" in the last 72 hours. Urine analysis:    Component Value Date/Time   COLORURINE YELLOW 05/12/2023 2016   APPEARANCEUR CLEAR 05/12/2023 2016   LABSPEC 1.011 05/12/2023  2016   PHURINE 6.0 05/12/2023 2016   GLUCOSEU >=500 (A) 05/12/2023 2016   GLUCOSEU Negative 11/30/2011 0804   HGBUR NEGATIVE 05/12/2023 2016   BILIRUBINUR NEGATIVE 05/12/2023 2016   BILIRUBINUR neg 05/18/2016 0932   KETONESUR NEGATIVE 05/12/2023 2016   PROTEINUR NEGATIVE 05/12/2023 2016   UROBILINOGEN negative 05/18/2016 0932   UROBILINOGEN 1.0 11/23/2014 2250   NITRITE NEGATIVE 05/12/2023 2016   LEUKOCYTESUR NEGATIVE 05/12/2023 2016   Sepsis Labs: @LABRCNTIP (procalcitonin:4,lacticidven:4) )No results found for this or any previous visit (from the past 240 hours).   Radiological Exams on Admission: DG Chest Port 1 View Result Date: 12/17/2023 EXAM: 1 VIEW XRAY OF THE CHEST 12/17/2023 12:46:04 AM COMPARISON: 05/14/2023 CLINICAL HISTORY: Questionable sepsis - evaluate for abnormality. Pt comes from home via [facility] for generalized weakness. Pt had redness and swelling to R elbow, started on antibiotic today for it. FINDINGS: LUNGS AND PLEURA: No focal pulmonary opacity. No pulmonary edema. No pleural effusion. No pneumothorax. HEART AND MEDIASTINUM: Stable cardiomegaly. BONES AND SOFT TISSUES: No acute osseous abnormality. VASCULATURE: Thoracic aortic atherosclerosis. Left subclavian ICD in place. IMPRESSION: 1. No acute findings. 2. Stable cardiomegaly. Electronically signed by: Zadie Herter MD 12/17/2023 12:48 AM EDT RP Workstation: [id number]    EKG: Independently reviewed.  Atrial flutter rate controlled.  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  Cultures obtained and started on empiric antibiotics.  Consult patient's orthopedic surgeon to get further input.  Check sed rate and uric acid levels. History of A-fib presently in atrial flutter rate controlled.  Continue amiodarone  Toprol -XL and since patient may need procedure will keep patient on heparin  and hold Eliquis  for now.  Patient and daughter agreeable with keeping on heparin .  Has had prior episodes of embolism to the spleen. Chronic CHF appears compensated.  Continue Lasix , Entresto , eplerenone , dapagliflozin  and Toprol -XL. Chronic kidney disease stage III creatinine at around baseline. Diabetes mellitus type 2 takes NovoLog  75/25 based on the sliding scale coverage.  Patient states yesterday she did take 20 units in the morning and 10 in the night.  There are few days last week when she did not take any.  Will keep patient on 10 units twice daily for now with sliding scale coverage. Sleep apnea on CPAP at bedtime. Hypothyroidism on Synthroid . History of VT status post ICD placement on amiodarone .   Since patient has right elbow swelling with possibility of olecranon bursitis versus cellulitis and will need further workup and will need more than 2 midnight stay.   DVT prophylaxis: Heparin  infusion for now.  If no procedure planned then changed to Eliquis . Code Status: Full code. Family Communication: Patient's daughter at the bedside. Disposition Plan: Medical floor. Consults called: Will consult orthopedic surgeon. Admission status:  Observation.

## 2023-12-17 NOTE — Consult Note (Signed)
 Dayne Even, MD  Lucrezia Sachs, PA-C                                  [facility]                [address] [place], [place]  [address]   ORTHOPAEDIC CONSULTATION  Heidi KAMPHUIS            MRN:  [id number] DOB/SEX:  1941/03/21/female     CHIEF COMPLAINT:  Painful right elbow  HISTORY: Heidi Stark a 83 y.o. female with multiple medical issues.  Know her daughter well.  Has had right elbow swelling and pain for several days.  Was started on po cephalosporin over weekend but ended up in ER yesterday and admitted.  Has received so IV cephalosporin and feels somewhat better.   PAST MEDICAL HISTORY: Patient Active Problem List   Diagnosis Date Noted   Cellulitis of elbow 12/17/2023   Elbow swelling, right 12/17/2023   Olecranon bursitis 12/17/2023   Knee effusion, right 11/09/2023   Type 2 diabetes mellitus with hyperlipidemia (HCC) 05/16/2023   Hypothyroidism 05/16/2023   Cellulitis of left lower extremity 05/13/2023   Compression fracture of lumbar spine, non-traumatic, sequela 05/13/2023   Acute on chronic diastolic CHF (congestive heart failure) (HCC) 05/13/2023   Diabetic nephropathy (HCC) 10/13/2021   Hyperglycemia due to type 2 diabetes mellitus (HCC) 10/13/2021   Proteinuria 10/13/2021   Pure hypercholesterolemia 10/13/2021   COVID-19 04/15/2021   Respiratory failure with hypoxia (HCC) 04/15/2021   Systolic CHF (HCC) 04/15/2021   CKD (chronic kidney disease) stage 3, GFR 30-59 ml/min (HCC) 04/15/2021   Elevated troponin 04/15/2021   Acute on chronic systolic (congestive) heart failure (HCC) 09/14/2020   Wide-complex tachycardia 09/07/2020   Ventricular tachyarrhythmia (HCC) 09/07/2020   VT (ventricular tachycardia) (HCC) 09/07/2020   Persistent atrial fibrillation (HCC) 04/30/2018   Wound of left leg 10/01/2017   Cellulitis of right lower extremity 11/27/2016   Encounter for assessment for deep vein thrombosis (DVT) 11/14/2016   Hypokalemia  08/13/2016   Type 2 diabetes mellitus with hyperglycemia, with long-term current use of insulin  (HCC)    Atherosclerosis of aorta (HCC) 06/26/2016   Colitis 11/24/2014   Severe obesity (BMI >= 40) (HCC) 11/12/2013   Paroxysmal atrial fibrillation (HCC) 11/03/2013   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 10/28/2013   COLONIC POLYPS 08/16/2010   PAD (peripheral artery disease) (HCC) 08/15/2010   HLD (hyperlipidemia) 11/18/2009   MYALGIA 11/18/2009   OBESITY 05/14/2007   PULMONARY NODULE, RIGHT MIDDLE LOBE 05/14/2007   Obstructive sleep apnea 12/06/2006   Essential hypertension 12/06/2006   COPD (chronic obstructive pulmonary disease) (HCC) 12/06/2006   Past Medical History:  Diagnosis Date   Arthritis    "hands, wrists" (11/07/2016)   CHF (congestive heart failure) (HCC)    Chronic lower back pain    Constipation    DVT (deep venous thrombosis) (HCC)    History of blood transfusion 1990   "related to OR"   History of kidney stones    Hyperlipidemia    Hypertension    Joint pain    Lower extremity edema    OSA on CPAP    Persistent atrial fibrillation (HCC)    Pneumonia    "several times" (11/07/2016)   Type II diabetes mellitus (HCC)    Past Surgical History:  Procedure Laterality Date   ATRIAL FIBRILLATION  ABLATION N/A 11/07/2016   Procedure: Atrial Fibrillation Ablation;  Surgeon: Will Cortland Ding, MD;  Location: [facility] INVASIVE CV LAB;  Service: Cardiovascular;  Laterality: N/A;   BACK SURGERY     CARDIAC CATHETERIZATION     CARDIOVERSION N/A 08/11/2016   Procedure: CARDIOVERSION;  Surgeon: Darlis Eisenmenger, MD;  Location: [facility] ENDOSCOPY;  Service: Cardiovascular;  Laterality: N/A;   CARDIOVERSION N/A 12/03/2017   Procedure: CARDIOVERSION;  Surgeon: Darlis Eisenmenger, MD;  Location: [facility] ENDOSCOPY;  Service: Cardiovascular;  Laterality: N/A;   CARDIOVERSION N/A 05/02/2018   Procedure: CARDIOVERSION;  Surgeon: Hazle Lites, MD;  Location: [facility]  ENDOSCOPY;  Service: Cardiovascular;  Laterality: N/A;   CARDIOVERSION N/A 06/23/2020   Procedure: CARDIOVERSION;  Surgeon: Sonny Dust, MD;  Location: [facility] ENDOSCOPY;  Service: Cardiovascular;  Laterality: N/A;   CARDIOVERSION N/A 10/11/2020   Procedure: CARDIOVERSION;  Surgeon: Darlis Eisenmenger, MD;  Location: [facility] ENDOSCOPY;  Service: Cardiovascular;  Laterality: N/A;   CARPAL TUNNEL RELEASE     CATARACT EXTRACTION W/ INTRAOCULAR LENS  IMPLANT, BILATERAL Bilateral    COLON SURGERY  1990   vein graft and colon repair after nicked artery with back surgert   COLONOSCOPY     CORONARY ANGIOGRAPHY N/A 09/08/2020   Procedure: CORONARY ANGIOGRAPHY;  Surgeon: Darlis Eisenmenger, MD;  Location: [facility] INVASIVE CV LAB;  Service: Cardiovascular;  Laterality: N/A;   CYSTECTOMY     between bladder and kidneys   GANGLION CYST EXCISION Left    ICD IMPLANT N/A 09/15/2020   Procedure: ICD IMPLANT;  Surgeon: Lei Pump, MD;  Location: [facility] INVASIVE CV LAB;  Service: Cardiovascular;  Laterality: N/A;   KNEE CARTILAGE SURGERY Right 1960s   LUMBAR DISC SURGERY  1990   REPAIR ILIAC ARTERY  1990   vein graft and colon repair after nicked artery with back surgert   TEE WITHOUT CARDIOVERSION N/A 10/31/2013   Procedure: TRANSESOPHAGEAL ECHOCARDIOGRAM (TEE);  Surgeon: Darlis Eisenmenger, MD;  Location: [facility] ENDOSCOPY;  Service: Cardiovascular;  Laterality: N/A;   TUBAL LIGATION       MEDICATIONS:   Current Facility-Administered Medications:    amiodarone  (PACERONE ) tablet 200 mg, 200 mg, Oral, Daily, Kakrakandy, Arshad N, MD, 200 mg at 12/17/23 3664   ceFEPIme  (MAXIPIME ) 2 g in sodium chloride  0.9 % 100 mL IVPB, 2 g, Intravenous, Q12H, Poindexter, Leann T, RPH   fenofibrate  tablet 160 mg, 160 mg, Oral, Daily, Angelene Kelly, MD, 160 mg at 12/17/23 4034   furosemide  (LASIX ) tablet 40 mg, 40 mg, Oral, BID, Kakrakandy, Arshad N, MD   [COMPLETED] heparin  bolus via infusion 4,000 Units, 4,000 Units,  Intravenous, Once, 4,000 Units at 12/17/23 0518 **FOLLOWED BY** heparin  ADULT infusion 100 units/mL (25000 units/250mL), 1,200 Units/hr, Intravenous, Continuous, Poindexter, Leann T, RPH, Last Rate: 12 mL/hr at 12/17/23 1101, 1,200 Units/hr at 12/17/23 1101   icosapent  Ethyl (VASCEPA ) 1 g capsule 2 g, 2 g, Oral, BID, Kakrakandy, Arshad N, MD, 2 g at 12/17/23 0853   insulin  aspart (novoLOG ) injection 0-9 Units, 0-9 Units, Subcutaneous, TID WC, Kakrakandy, Arshad N, MD, 1 Units at 12/17/23 0859   insulin  aspart protamine - aspart (NOVOLOG  MIX 70/30) injection 10 Units, 10 Units, Subcutaneous, BID WC, Kakrakandy, Arshad N, MD, 10 Units at 12/17/23 0851   levothyroxine  (SYNTHROID ) tablet 50 mcg, 50 mcg, Oral, Daily, Kakrakandy, Arshad N, MD, 50 mcg at 12/17/23 7425   metoprolol  succinate (TOPROL -XL) 24 hr tablet 25 mg, 25 mg, Oral, QHS, Angelene Kelly, MD   mexiletine (  MEXITIL ) capsule 300 mg, 300 mg, Oral, BID, Angelene Kelly, MD, 300 mg at 12/17/23 5409   potassium chloride  (KLOR-CON  M) CR tablet 10 mEq, 10 mEq, Oral, BID, Angelene Kelly, MD, 10 mEq at 12/17/23 8119   pravastatin  (PRAVACHOL ) tablet 80 mg, 80 mg, Oral, q1800, Angelene Kelly, MD   sacubitril -valsartan  (ENTRESTO ) 24-26 mg per tablet, 1 tablet, Oral, BID, Angelene Kelly, MD, 1 tablet at 12/17/23 1478   sodium chloride  flush (NS) 0.9 % injection 10-40 mL, 10-40 mL, Intracatheter, PRN, Angelene Kelly, MD   spironolactone  (ALDACTONE ) tablet 25 mg, 25 mg, Oral, Daily, [facility], Arshad N, MD, 25 mg at 12/17/23 0852   [START ON 12/18/2023] vancomycin  (VANCOREADY) IVPB 1750 mg/350 mL, 1,750 mg, Intravenous, Q36H, Poindexter, Leann T, RPH  ALLERGIES:   Allergies  Allergen Reactions   Neomycin-Bacitracin Zn-Polymyx Rash   Sulfa Antibiotics Diarrhea and Nausea And Vomiting   Ciprofloxacin  Hives, Itching and Rash    Pt doesn't remember having any reaction to cipro    Spironolactone  Rash    REVIEW OF SYSTEMS:  REVIEWED IN DETAIL IN CHART  FAMILY HISTORY:   Family History  Problem Relation Age of Onset   Diabetes Mother    Hypertension Mother    Cancer Mother    Obesity Mother    Diabetes Father    Obesity Father    Diabetes Other    Melanoma Other    Factor V Leiden deficiency Daughter     SOCIAL HISTORY:   Social History   Tobacco Use   Smoking status: Former    Current packs/day: 0.00    Average packs/day: 1 pack/day for 50.0 years (50.0 ttl pk-yrs)    Types: Cigarettes    Start date: 08/17/1970    Quit date: 08/17/2020    Years since quitting: 3.3   Smokeless tobacco: Never   Tobacco comments:    Quit 2022. [place]  Substance Use Topics   Alcohol use: No    Alcohol/week: 0.0 standard drinks of alcohol     EXAMINATION: Vital signs in last 24 hours: Temp:  [97.9 F (36.6 C)-98.3 F (36.8 C)] 97.9 F (36.6 C) (06/02 0747) Pulse Rate:  [63-78] 69 (06/02 0747) Resp:  [16-19] 18 (06/02 0747) BP: (99-117)/(61-85) 99/61 (06/02 0747) SpO2:  [92 %-100 %] 94 % (06/02 0747) Weight:  [115.3 kg] 115.3 kg (06/02 0505)  BP 99/61 (BP Location: Right Wrist)   Pulse 69   Temp 97.9 F (36.6 C) (Oral)   Resp 18   Ht 5\' 6"  (1.676 m)   Wt 115.3 kg   SpO2 94%   BMI 41.03 kg/m     Musculoskeletal Exam:   right elbow full ROM.  Mild redness which is tender.  No fluctuance    DIAGNOSTIC STUDIES: Recent laboratory studies: Recent Labs    12/16/23 2351 12/17/23 0433  WBC 16.8* 14.1*  HGB 15.7* 15.1*  HCT 49.8* 48.4*  PLT 168 164   Recent Labs    12/16/23 2351 12/17/23 0433  NA 133* 134*  K 3.7 3.5  CL 97* 98  CO2 24 24  BUN 18 19  CREATININE 0.99 1.04*  GLUCOSE 151* 137*  CALCIUM  7.9* 8.0*   Lab Results  Component Value Date   INR 1.6 (H) 12/17/2023   INR 1.3 (H) 09/13/2020   INR 1.2 09/12/2020     Recent Radiographic Studies :  DG Chest Port 1 View Result Date: 12/17/2023 EXAM: 1 VIEW XRAY OF THE CHEST 12/17/2023 12:46:04 AM COMPARISON:  05/14/2023 CLINICAL  HISTORY: Questionable sepsis - evaluate for abnormality. Pt comes from home via [facility] for generalized weakness. Pt had redness and swelling to R elbow, started on antibiotic today for it. FINDINGS: LUNGS AND PLEURA: No focal pulmonary opacity. No pulmonary edema. No pleural effusion. No pneumothorax. HEART AND MEDIASTINUM: Stable cardiomegaly. BONES AND SOFT TISSUES: No acute osseous abnormality. VASCULATURE: Thoracic aortic atherosclerosis. Left subclavian ICD in place. IMPRESSION: 1. No acute findings. 2. Stable cardiomegaly. Electronically signed by: Zadie Herter MD 12/17/2023 12:48 AM EDT RP Workstation: [id number]   CUP PACEART INCLINIC DEVICE CHECK Result Date: 11/26/2023 Normal in-clinic single chamber ICD check. Presenting Rhythm: VS 58 . Routine testing was performed. Thresholds, sensing, and impedance demonstrate chronic elevated RV threshold and impedance. No treated arrhythmias. Estimated longevity 9.1 years. Pt enrolled in remote follow-up. Chronically elevated impedance on pacing lead.  Shock impedance stable.  Increased RV lead impedance threshold to 3000 ohms.  RV capture threshold 1.5V at 1 ms.  Adaptive turned off.  Reprogrammed to monitor at 3V at 1 ms.  Changes made per Dr. Lawana Pray.Wynona Hedger, BSN, RN   ASSESSMENT:  right elbow cellulitis   PLAN:  agree with IV antibiotics.  Don't feel requires aspiration or I&D at this time.  Probably related to pushing herself up in recliner frequently.  Will follow up daily to make sure things don't change.  Alphonzo Ask 12/17/2023, 12:51 PM

## 2023-12-18 ENCOUNTER — Observation Stay (HOSPITAL_COMMUNITY)

## 2023-12-18 DIAGNOSIS — Z6841 Body Mass Index (BMI) 40.0 and over, adult: Secondary | ICD-10-CM | POA: Diagnosis not present

## 2023-12-18 DIAGNOSIS — N1832 Chronic kidney disease, stage 3b: Secondary | ICD-10-CM | POA: Diagnosis not present

## 2023-12-18 DIAGNOSIS — F419 Anxiety disorder, unspecified: Secondary | ICD-10-CM | POA: Diagnosis present

## 2023-12-18 DIAGNOSIS — E871 Hypo-osmolality and hyponatremia: Secondary | ICD-10-CM | POA: Diagnosis present

## 2023-12-18 DIAGNOSIS — I480 Paroxysmal atrial fibrillation: Secondary | ICD-10-CM | POA: Diagnosis not present

## 2023-12-18 DIAGNOSIS — Z794 Long term (current) use of insulin: Secondary | ICD-10-CM | POA: Diagnosis not present

## 2023-12-18 DIAGNOSIS — Z743 Need for continuous supervision: Secondary | ICD-10-CM | POA: Diagnosis not present

## 2023-12-18 DIAGNOSIS — L03116 Cellulitis of left lower limb: Secondary | ICD-10-CM | POA: Diagnosis present

## 2023-12-18 DIAGNOSIS — Z8616 Personal history of COVID-19: Secondary | ICD-10-CM | POA: Diagnosis not present

## 2023-12-18 DIAGNOSIS — G4733 Obstructive sleep apnea (adult) (pediatric): Secondary | ICD-10-CM | POA: Diagnosis present

## 2023-12-18 DIAGNOSIS — Z79890 Hormone replacement therapy (postmenopausal): Secondary | ICD-10-CM | POA: Diagnosis not present

## 2023-12-18 DIAGNOSIS — I130 Hypertensive heart and chronic kidney disease with heart failure and stage 1 through stage 4 chronic kidney disease, or unspecified chronic kidney disease: Secondary | ICD-10-CM | POA: Diagnosis present

## 2023-12-18 DIAGNOSIS — Z7401 Bed confinement status: Secondary | ICD-10-CM | POA: Diagnosis not present

## 2023-12-18 DIAGNOSIS — I472 Ventricular tachycardia, unspecified: Secondary | ICD-10-CM | POA: Diagnosis not present

## 2023-12-18 DIAGNOSIS — I10 Essential (primary) hypertension: Secondary | ICD-10-CM | POA: Diagnosis not present

## 2023-12-18 DIAGNOSIS — I5032 Chronic diastolic (congestive) heart failure: Secondary | ICD-10-CM | POA: Diagnosis present

## 2023-12-18 DIAGNOSIS — M6281 Muscle weakness (generalized): Secondary | ICD-10-CM | POA: Diagnosis not present

## 2023-12-18 DIAGNOSIS — E039 Hypothyroidism, unspecified: Secondary | ICD-10-CM | POA: Diagnosis not present

## 2023-12-18 DIAGNOSIS — E1121 Type 2 diabetes mellitus with diabetic nephropathy: Secondary | ICD-10-CM | POA: Diagnosis not present

## 2023-12-18 DIAGNOSIS — M25421 Effusion, right elbow: Secondary | ICD-10-CM | POA: Diagnosis not present

## 2023-12-18 DIAGNOSIS — E1122 Type 2 diabetes mellitus with diabetic chronic kidney disease: Secondary | ICD-10-CM | POA: Diagnosis present

## 2023-12-18 DIAGNOSIS — E66813 Obesity, class 3: Secondary | ICD-10-CM | POA: Diagnosis present

## 2023-12-18 DIAGNOSIS — I739 Peripheral vascular disease, unspecified: Secondary | ICD-10-CM | POA: Diagnosis not present

## 2023-12-18 DIAGNOSIS — M6259 Muscle wasting and atrophy, not elsewhere classified, multiple sites: Secondary | ICD-10-CM | POA: Diagnosis not present

## 2023-12-18 DIAGNOSIS — M7021 Olecranon bursitis, right elbow: Secondary | ICD-10-CM | POA: Diagnosis not present

## 2023-12-18 DIAGNOSIS — L03113 Cellulitis of right upper limb: Secondary | ICD-10-CM | POA: Diagnosis not present

## 2023-12-18 DIAGNOSIS — M7020 Olecranon bursitis, unspecified elbow: Secondary | ICD-10-CM | POA: Diagnosis not present

## 2023-12-18 DIAGNOSIS — R9431 Abnormal electrocardiogram [ECG] [EKG]: Secondary | ICD-10-CM

## 2023-12-18 DIAGNOSIS — I5020 Unspecified systolic (congestive) heart failure: Secondary | ICD-10-CM | POA: Diagnosis not present

## 2023-12-18 DIAGNOSIS — N183 Chronic kidney disease, stage 3 unspecified: Secondary | ICD-10-CM | POA: Diagnosis not present

## 2023-12-18 DIAGNOSIS — N179 Acute kidney failure, unspecified: Secondary | ICD-10-CM | POA: Diagnosis present

## 2023-12-18 DIAGNOSIS — E782 Mixed hyperlipidemia: Secondary | ICD-10-CM | POA: Diagnosis present

## 2023-12-18 DIAGNOSIS — E785 Hyperlipidemia, unspecified: Secondary | ICD-10-CM | POA: Diagnosis not present

## 2023-12-18 DIAGNOSIS — J449 Chronic obstructive pulmonary disease, unspecified: Secondary | ICD-10-CM | POA: Diagnosis not present

## 2023-12-18 DIAGNOSIS — L03119 Cellulitis of unspecified part of limb: Secondary | ICD-10-CM | POA: Diagnosis not present

## 2023-12-18 DIAGNOSIS — Z7901 Long term (current) use of anticoagulants: Secondary | ICD-10-CM | POA: Diagnosis not present

## 2023-12-18 DIAGNOSIS — E1151 Type 2 diabetes mellitus with diabetic peripheral angiopathy without gangrene: Secondary | ICD-10-CM | POA: Diagnosis present

## 2023-12-18 DIAGNOSIS — N1831 Chronic kidney disease, stage 3a: Secondary | ICD-10-CM | POA: Diagnosis not present

## 2023-12-18 DIAGNOSIS — R058 Other specified cough: Secondary | ICD-10-CM | POA: Diagnosis not present

## 2023-12-18 DIAGNOSIS — F32A Depression, unspecified: Secondary | ICD-10-CM | POA: Diagnosis present

## 2023-12-18 DIAGNOSIS — R2681 Unsteadiness on feet: Secondary | ICD-10-CM | POA: Diagnosis not present

## 2023-12-18 DIAGNOSIS — I484 Atypical atrial flutter: Secondary | ICD-10-CM | POA: Diagnosis present

## 2023-12-18 DIAGNOSIS — Z7985 Long-term (current) use of injectable non-insulin antidiabetic drugs: Secondary | ICD-10-CM | POA: Diagnosis not present

## 2023-12-18 DIAGNOSIS — M25521 Pain in right elbow: Secondary | ICD-10-CM | POA: Diagnosis not present

## 2023-12-18 DIAGNOSIS — I5022 Chronic systolic (congestive) heart failure: Secondary | ICD-10-CM | POA: Diagnosis not present

## 2023-12-18 LAB — CBC
HCT: 44.7 % (ref 36.0–46.0)
Hemoglobin: 13.8 g/dL (ref 12.0–15.0)
MCH: 31.1 pg (ref 26.0–34.0)
MCHC: 30.9 g/dL (ref 30.0–36.0)
MCV: 100.7 fL — ABNORMAL HIGH (ref 80.0–100.0)
Platelets: 159 10*3/uL (ref 150–400)
RBC: 4.44 MIL/uL (ref 3.87–5.11)
RDW: 15 % (ref 11.5–15.5)
WBC: 14.5 10*3/uL — ABNORMAL HIGH (ref 4.0–10.5)
nRBC: 0 % (ref 0.0–0.2)

## 2023-12-18 LAB — URINALYSIS, W/ REFLEX TO CULTURE (INFECTION SUSPECTED)
Bacteria, UA: NONE SEEN
Bilirubin Urine: NEGATIVE
Glucose, UA: >=500 mg/dL — AB
Hgb urine dipstick: NEGATIVE
Ketones, ur: NEGATIVE mg/dL
Leukocytes,Ua: NEGATIVE
Nitrite: NEGATIVE
Protein, ur: NEGATIVE mg/dL
Specific Gravity, Urine: 1.014 (ref 1.005–1.030)
pH: 5 (ref 5.0–8.0)

## 2023-12-18 LAB — HEPARIN LEVEL (UNFRACTIONATED): Heparin Unfractionated: >1.1 [IU]/mL — ABNORMAL HIGH (ref 0.30–0.70)

## 2023-12-18 LAB — ECHOCARDIOGRAM COMPLETE
Area-P 1/2: 3.6 cm2
Height: 66 in
S' Lateral: 3.6 cm
Weight: 4067.05 [oz_av]

## 2023-12-18 LAB — GLUCOSE, CAPILLARY
Glucose-Capillary: 121 mg/dL — ABNORMAL HIGH (ref 70–99)
Glucose-Capillary: 113 mg/dL — ABNORMAL HIGH (ref 70–99)
Glucose-Capillary: 108 mg/dL — ABNORMAL HIGH (ref 70–99)
Glucose-Capillary: 167 mg/dL — ABNORMAL HIGH (ref 70–99)
Glucose-Capillary: 89 mg/dL (ref 70–99)

## 2023-12-18 LAB — APTT: aPTT: 68 s — ABNORMAL HIGH (ref 24–36)

## 2023-12-18 LAB — CREATININE, SERUM
Creatinine, Ser: 1.66 mg/dL — ABNORMAL HIGH (ref 0.44–1.00)
GFR, Estimated: 31 mL/min — ABNORMAL LOW (ref >60)

## 2023-12-18 MED ORDER — PERFLUTREN LIPID MICROSPHERE
1.0000 mL | INTRAVENOUS | Status: AC | PRN
  Administered 2023-12-18: 3 mL via INTRAVENOUS

## 2023-12-18 MED ORDER — CEFAZOLIN SODIUM-DEXTROSE 2-4 GM/100ML-% IV SOLN
2.0000 g | Freq: Three times a day (TID) | INTRAVENOUS | Status: DC
  Administered 2023-12-18 – 2023-12-23 (×15): 2 g via INTRAVENOUS
  Filled 2023-12-18 (×16): qty 100

## 2023-12-18 MED ORDER — ONDANSETRON HCL 4 MG/2ML IJ SOLN
4.0000 mg | Freq: Once | INTRAMUSCULAR | Status: AC
  Administered 2023-12-18: 4 mg via INTRAVENOUS
  Filled 2023-12-18: qty 2

## 2023-12-18 NOTE — Plan of Care (Incomplete)
  Problem: Education: Goal: Knowledge of General Education information will improve Description: Including pain rating scale, medication(s)/side effects and non-pharmacologic comfort measures Outcome: Progressing   Problem: Health Behavior/Discharge Planning: Goal: Ability to manage health-related needs will improve Outcome: Progressing   Problem: Clinical Measurements: Goal: Ability to maintain clinical measurements within normal limits will improve Outcome: Progressing Goal: Will remain free from infection Outcome: Progressing Goal: Diagnostic test results will improve Outcome: Progressing Goal: Respiratory complications will improve Outcome: Progressing Goal: Cardiovascular complication will be avoided Outcome: Progressing   Problem: Activity: Goal: Risk for activity intolerance will decrease Outcome: Progressing   Problem: Coping: Goal: Level of anxiety will decrease Outcome: Progressing   Problem: Elimination: Goal: Will not experience complications related to bowel motility Outcome: Progressing Goal: Will not experience complications related to urinary retention Outcome: Progressing   Problem: Pain Managment: Goal: General experience of comfort will improve and/or be controlled Outcome: Progressing   Problem: Safety: Goal: Ability to remain free from injury will improve Outcome: Progressing   Problem: Skin Integrity: Goal: Risk for impaired skin integrity will decrease Outcome: Progressing   Problem: Metabolic: Goal: Ability to maintain appropriate glucose levels will improve Outcome: Progressing   Problem: Nutritional: Goal: Maintenance of adequate nutrition will improve Outcome: Progressing   Problem: Skin Integrity: Goal: Risk for impaired skin integrity will decrease Outcome: Progressing   Problem: Tissue Perfusion: Goal: Adequacy of tissue perfusion will improve Outcome: Progressing   Problem: Nutrition: Goal: Adequate nutrition will be  maintained Outcome: Adequate for Discharge

## 2023-12-18 NOTE — Progress Notes (Signed)
 Pt hypotensive requiring several home medications to be held. MD requested review of recent fill history for antihypertensives and mexiletine.   I called [facility] pharmacy and confirmed the last fill dates for the following medications, and that they were picked up.  Entresto  90 day supply filled 10/15/23 (picked up) Metoprolol  succinate 90 day supply filled 10/18/23 (picked up) Eplerenone  90 day supply filled 4/3 (picked up) Mexiletine 90 day supply filled 06/27/23 (picked up) Furosemide  90 day supply filled 10/17/23 (picked up)  Confirmed with [facility] that they filled a 30 day supply of mexiletine on 11/14/23 and delivered to patient's facility.   Shireen Dory, PharmD 12/18/23 9:29 AM

## 2023-12-18 NOTE — Progress Notes (Signed)
 Echocardiogram 2D Echocardiogram has been performed.  Farley Honer, RDCS 12/18/2023, 10:13 AM

## 2023-12-18 NOTE — Progress Notes (Signed)
 PROGRESS NOTE  Heidi Stark  DOB: 1941/06/27  PCP: Estill Hemming, [place] GNF:[id number]  DOA: 12/16/2023  LOS: 0 days  Hospital Day: 3  Brief narrative: Heidi Stark is a 83 y.o. female with PMH significant for morbid obesity, DM2, HTN, HLD, OSA on CPAP, CHF, A-fib, h/o VT s/p ICD, CKD, hypothyroidism. Lives at home, uses a walker. 6/1, patient presented to the ED with complaint of sudden onset of pain, swelling of the right elbow, rapidly progressing in 1 to 2 days.  Denies any trauma but states she supports her weight with her elbow while getting in and out of the recliner and could have rubbed the elbow in the process. The day prior on 5/31, patient went to see orthopedics as an outpatient and was started on a course of cephalexin  but with worsening swelling, she came to the ED.  In the ED, patient was afebrile, hemodynamically stable Initial labs with WBC count 16.8, hemoglobin 15.7, platelet normal, sodium 133, potassium 3.7, renal function normal, lactic acid level normal. Clinically suspected to have right elbow cellulitis versus olecranon bursitis. Blood culture collected Patient was started on broad-spectrum IV antibiotics Admitted to [facility] Orthopedics consulted  Subjective: Patient was seen and examined this morning.   Lying on bed.  Not in distress.   Blood pressure is running low since yesterday.  Noted rising creatinine today.   Daughter at bedside Right elbow cellulitis seems to be improving.  Assessment and plan: Right elbow cellulitis/olecranon bursitis Was seen by orthopedics in the office.  Started on cephalexin .  Presented with worsening symptoms Blood culture sent from ED Was started on IV cefepime .   Patient was on seen by orthopedics Dr. Daldorf from [facility].  No need of intervention. Right elbow swelling, redness seems to be improving. Switched to IV Ancef  today.  Chronic CHF HTN PTA meds- Toprol , Lasix , Entresto , eplerenone , Farxiga  All of  these were resumed on admission. Since yesterday afternoon, blood pressure is running low.  I confirmed with patient and family about her compliance to CHF regimen.  Patient seems to be indeed taking all her CHF meds as she is supposed to -Toprol , Lasix , Entresto , Aldactone , Farxiga  Given low blood pressure episode, these medicines are currently on hold. Echocardiogram this morning showed EF of 70 to 75%, moderate concentric LVH As requested by family, I have asked cardiology for consult  H/o A-fib/a flutter H/o V. Tach s/p AICD Currently rate controlled on amiodarone .  Toprol  is currently on hold because of low blood pressures Chronically anticoagulated on Eliquis . On admission, switched from Eliquis  to heparin  drip for possible procedure.  Since it has been determined that patient does not need any surgery, okay to switch back to Eliquis  today  AKI on CKD 3a Creatinine went up to 1.66 today probably due to several hours of hypotension since yesterday.  I would avoid IV fluid.  Hopefully with meds on hold, blood pressure will improve and renal function will recover.  Continue to monitor Recent Labs    10/11/23 1552 11/08/23 1651 11/09/23 1253 11/10/23 0615 11/11/23 0740 11/12/23 0529 11/13/23 0531 11/14/23 0620 12/16/23 2351 12/17/23 0433 12/18/23 0513  BUN 22 21 18 21  25* 27* 24* 19 18 19   --   CREATININE 1.46* 1.28* 1.07* 1.25* 1.28* 1.08* 1.04* 0.60 0.99 1.04* 1.66*   Type 2 diabetes mellitus A1c 6.5 on 09/06/2023 PTA meds-NovoLog  75/25 sliding scale but not on consistent dose  Currently on NovoLog  70/30 at 10 units twice daily along with  SSI/Accu-Cheks Recent Labs  Lab 12/17/23 1707 12/17/23 2148 12/18/23 0304 12/18/23 0743 12/18/23 1144  GLUCAP 141* 116* 121* 113* 108*   Hypothyroidism Continue Synthroid   Anxiety/depression Zoloft  PRN  Morbid Obesity  Body mass index is 41.03 kg/m. Patient has been advised to make an attempt to improve diet and exercise  patterns to aid in weight loss.  OSA On nocturnal CPAP  Impaired mobility Lives at home.  Uses a walker.  Pending PT eval   Goals of care   Code Status: Full Code    DVT prophylaxis: On heparin  drip    Antimicrobials: IV cefepime  Fluid: None Consultants: Orthopedics Family Communication: None at bedside  Status: Observation Level of care:  Telemetry   Patient is from: Home Needs to continue in-hospital care: Pending orthopedic eval Anticipated d/c to: Pending clinical course   Diet:  Diet Order             Diet heart healthy/carb modified Room service appropriate? Yes; Fluid consistency: Thin; Fluid restriction: 1500 mL Fluid  Diet effective now                   Scheduled Meds:  acetaminophen   1,000 mg Oral TID   amiodarone   200 mg Oral Daily   fenofibrate   160 mg Oral Daily   icosapent  Ethyl  2 g Oral BID   insulin  aspart  0-9 Units Subcutaneous TID WC   insulin  aspart protamine - aspart  10 Units Subcutaneous BID WC   levothyroxine   50 mcg Oral Daily   mexiletine  300 mg Oral BID   pravastatin   80 mg Oral q1800    PRN meds: oxyCODONE , sodium chloride  flush   Infusions:    ceFAZolin  (ANCEF ) IV 2 g (12/18/23 1325)   heparin  1,400 Units/hr (12/17/23 2200)    Antimicrobials: Anti-infectives (From admission, onward)    Start     Dose/Rate Route Frequency Ordered Stop   12/18/23 1600  vancomycin  (VANCOREADY) IVPB 1750 mg/350 mL  Status:  Discontinued        1,750 mg 175 mL/hr over 120 Minutes Intravenous Every 36 hours 12/17/23 0522 12/18/23 0809   12/18/23 1400  ceFAZolin  (ANCEF ) IVPB 2g/100 mL premix        2 g 200 mL/hr over 30 Minutes Intravenous Every 8 hours 12/18/23 0815     12/17/23 1500  ceFEPIme  (MAXIPIME ) 2 g in sodium chloride  0.9 % 100 mL IVPB  Status:  Discontinued        2 g 200 mL/hr over 30 Minutes Intravenous Every 12 hours 12/17/23 0510 12/18/23 0809   12/17/23 0615  vancomycin  (VANCOCIN ) IVPB 1000 mg/200 mL premix         1,000 mg 200 mL/hr over 60 Minutes Intravenous  Once 12/17/23 0521 12/17/23 0650   12/17/23 0230  ceFEPIme  (MAXIPIME ) 2 g in sodium chloride  0.9 % 100 mL IVPB        2 g 200 mL/hr over 30 Minutes Intravenous  Once 12/17/23 0229 12/17/23 0400   12/17/23 0230  metroNIDAZOLE  (FLAGYL ) IVPB 500 mg        500 mg 100 mL/hr over 60 Minutes Intravenous  Once 12/17/23 0229 12/17/23 0400   12/17/23 0230  vancomycin  (VANCOCIN ) IVPB 1000 mg/200 mL premix        1,000 mg 200 mL/hr over 60 Minutes Intravenous  Once 12/17/23 0229 12/17/23 0359       Objective: Vitals:   12/18/23 0456 12/18/23 1259  BP: (!) 93/56 102/66  Pulse: (!) 55  70  Resp: 16 16  Temp: 97.9 F (36.6 C) 98.5 F (36.9 C)  SpO2: 97% 94%    Intake/Output Summary (Last 24 hours) at 12/18/2023 1409 Last data filed at 12/18/2023 1100 Gross per 24 hour  Intake 860.97 ml  Output 1050 ml  Net -189.03 ml   Filed Weights   12/17/23 0505  Weight: 115.3 kg   Weight change:  Body mass index is 41.03 kg/m.   Physical Exam: General exam: Pleasant, elderly Caucasian female. Propped up in bed. Morbidly obese.  On low-flow oxygen Skin: No rashes, lesions or ulcers. HEENT: Atraumatic, normocephalic, no obvious bleeding Lungs: Clear to auscultation bilaterally, on low-flow oxygen CVS: S1, S2, no murmur,   GI/Abd: Soft, nontender, distended from obesity, bowel sound present,   CNS: Alert, awake, oriented x 3 Psychiatry: Mood appropriate,  Extremities: No pedal edema, no calf tenderness, right elbow with redness, swelling, pain on touch over the olecranon.  An area of scab noted but no open wound  Data Review: I have personally reviewed the laboratory data and studies available.  F/u labs  Unresulted Labs (From admission, onward)     Start     Ordered   12/19/23 0500  Heparin  level (unfractionated)  Daily,   R     Question:  Specimen collection method  Answer:  IV Team=IV Team collect   12/18/23 0754   12/19/23 0500  APTT   Daily,   R     Question:  Specimen collection method  Answer:  IV Team=IV Team collect   12/18/23 0754   12/19/23 0500  Basic metabolic panel with GFR  Tomorrow morning,   R       Question:  Specimen collection method  Answer:  IV Team=IV Team collect   12/18/23 1409   12/18/23 0500  CBC  Daily,   R      12/17/23 0450           Signed, Hoyt Macleod, MD [facility] 12/18/2023

## 2023-12-18 NOTE — Progress Notes (Signed)
 Subjective:     Activity level:  bedrest Diet tolerance:  poor Voiding:  bedpan, wick Patient reports pain as mild.    Objective: Vital signs in last 24 hours: Temp:  [97.5 F (36.4 C)-97.9 F (36.6 C)] 97.9 F (36.6 C) (06/03 0456) Pulse Rate:  [54-64] 55 (06/03 0456) Resp:  [15-22] 16 (06/03 0456) BP: (74-111)/(30-60) 93/56 (06/03 0456) SpO2:  [93 %-97 %] 97 % (06/03 0456)  Labs: Recent Labs    12/16/23 2351 12/17/23 0433 12/18/23 0513  HGB 15.7* 15.1* 13.8   Recent Labs    12/17/23 0433 12/18/23 0513  WBC 14.1* 14.5*  RBC 4.92 4.44  HCT 48.4* 44.7  PLT 164 159   Recent Labs    12/16/23 2351 12/17/23 0433 12/18/23 0513  NA 133* 134*  --   K 3.7 3.5  --   CL 97* 98  --   CO2 24 24  --   BUN 18 19  --   CREATININE 0.99 1.04* 1.66*  GLUCOSE 151* 137*  --   CALCIUM  7.9* 8.0*  --    Recent Labs    12/17/23 0433  INR 1.6*    Physical Exam:  Neurologically intact Sensation intact distally Good ROM elbow Mild redness, no fluctuance   Assessment/Plan: Cellulitis slowly responding to IV abx     Continue IV abx.  May restart baseline anticoagulation as surgery will not be necessary.    Alphonzo Ask 12/18/2023, 11:53 AM

## 2023-12-18 NOTE — Progress Notes (Signed)
   12/18/23 0022  BiPAP/CPAP/SIPAP  $ Non-Invasive Home Ventilator  Initial  $ Face Mask Medium Yes  BiPAP/CPAP/SIPAP Pt Type Adult  BiPAP/CPAP/SIPAP DREAMSTATIOND  Mask Type Full face mask  Dentures removed? Yes - Placed in denture cup  Mask Size Medium  Set Rate 0 breaths/min  Respiratory Rate 18 breaths/min  Flow Rate 1 lpm  Patient Home Machine No  Patient Home Mask No  Patient Home Tubing No  Auto Titrate Yes  Minimum cmH2O 5 cmH2O  Maximum cmH2O 20 cmH2O  Device Plugged into [employer] Power Outlet Yes

## 2023-12-18 NOTE — Progress Notes (Signed)
 PHARMACY - ANTICOAGULATION CONSULT NOTE  Pharmacy Consult for Heparin  (PTA apixaban  on hold) Indication: atrial fibrillation  Allergies  Allergen Reactions   Neomycin-Bacitracin Zn-Polymyx Rash   Sulfa Antibiotics Diarrhea and Nausea And Vomiting   Ciprofloxacin  Hives, Itching and Rash    Pt doesn't remember having any reaction to cipro    Spironolactone  Rash    Patient Measurements: Height: 5\' 6"  (167.6 cm) Weight: 115.3 kg (254 lb 3.1 oz) IBW/kg (Calculated) : 59.3 HEPARIN  DW (KG): 86.5  Vital Signs: Temp: 97.9 F (36.6 C) (06/03 0456) Temp Source: Oral (06/03 0456) BP: 93/56 (06/03 0456) Pulse Rate: 55 (06/03 0456)  Labs: Recent Labs    12/16/23 2351 12/17/23 0433 12/17/23 1253 12/17/23 2139 12/18/23 0513  HGB 15.7* 15.1*  --   --  13.8  HCT 49.8* 48.4*  --   --  44.7  PLT 168 164  --   --  159  APTT  --   --  62* 71* 68*  LABPROT  --  18.8*  --   --   --   INR  --  1.6*  --   --   --   HEPARINUNFRC  --   --  >1.10*  --  >1.10*  CREATININE 0.99 1.04*  --   --  1.66*    Estimated Creatinine Clearance: 34.3 mL/min (A) (by C-G formula based on SCr of 1.66 mg/dL (H)).   Medical History: Past Medical History:  Diagnosis Date   Arthritis    "hands, wrists" (11/07/2016)   CHF (congestive heart failure) (HCC)    Chronic lower back pain    Constipation    DVT (deep venous thrombosis) (HCC)    History of blood transfusion 1990   "related to OR"   History of kidney stones    Hyperlipidemia    Hypertension    Joint pain    Lower extremity edema    OSA on CPAP    Persistent atrial fibrillation (HCC)    Pneumonia    "several times" (11/07/2016)   Type II diabetes mellitus (HCC)     Medications:  PTA apixaban  5mg  PO BID - LD 6/1 @ 09:00  Assessment: Pt is an 42 yoF admitted with cellulitis, concern for possible  olecranon bursitis. Pt chronically anticoagulated with apixaban  for atrial fibrillation. Pharmacy consulted to dose heparin  while apixaban  on hold  for possible invasive procedures.    Today, 12/18/23 Confirmatory aPTT = 68 seconds remains therapeutic on heparin  infusion of 1400 units/hr Heparin  level >1.1 (falsely elevated due to recent apixaban ) CBC: WNL No bleeding reported  Goal of Therapy:  Heparin  level 0.3-0.7 units/ml aPTT 66-102 seconds Monitor platelets by anticoagulation protocol: Yes   Plan:  Continue heparin  infusion at 1400 units/hr CBC, heparin  level, aPTT daily Once heparin  level and aPTT correlate, can monitor using heparin  level only Monitor for signs of bleeding Follow for ability to resume PO anticoagulation  If pt undergoes invasive procedures or surgery, will need orders for when to hold heparin  peri-operatively.  Shireen Dory, PharmD 12/18/23 7:50 AM

## 2023-12-19 DIAGNOSIS — I10 Essential (primary) hypertension: Secondary | ICD-10-CM

## 2023-12-19 DIAGNOSIS — I480 Paroxysmal atrial fibrillation: Secondary | ICD-10-CM

## 2023-12-19 DIAGNOSIS — I5022 Chronic systolic (congestive) heart failure: Secondary | ICD-10-CM

## 2023-12-19 DIAGNOSIS — N1831 Chronic kidney disease, stage 3a: Secondary | ICD-10-CM

## 2023-12-19 DIAGNOSIS — E785 Hyperlipidemia, unspecified: Secondary | ICD-10-CM

## 2023-12-19 DIAGNOSIS — L03119 Cellulitis of unspecified part of limb: Secondary | ICD-10-CM | POA: Diagnosis not present

## 2023-12-19 DIAGNOSIS — M25421 Effusion, right elbow: Secondary | ICD-10-CM | POA: Diagnosis not present

## 2023-12-19 DIAGNOSIS — I472 Ventricular tachycardia, unspecified: Secondary | ICD-10-CM

## 2023-12-19 DIAGNOSIS — E039 Hypothyroidism, unspecified: Secondary | ICD-10-CM

## 2023-12-19 DIAGNOSIS — N183 Chronic kidney disease, stage 3 unspecified: Secondary | ICD-10-CM

## 2023-12-19 DIAGNOSIS — I739 Peripheral vascular disease, unspecified: Secondary | ICD-10-CM

## 2023-12-19 LAB — BASIC METABOLIC PANEL WITH GFR
Anion gap: 11 (ref 5–15)
BUN: 25 mg/dL — ABNORMAL HIGH (ref 8–23)
CO2: 25 mmol/L (ref 22–32)
Calcium: 7.7 mg/dL — ABNORMAL LOW (ref 8.9–10.3)
Chloride: 97 mmol/L — ABNORMAL LOW (ref 98–111)
Creatinine, Ser: 1.27 mg/dL — ABNORMAL HIGH (ref 0.44–1.00)
GFR, Estimated: 42 mL/min — ABNORMAL LOW (ref >60)
Glucose, Bld: 121 mg/dL — ABNORMAL HIGH (ref 70–99)
Potassium: 3.8 mmol/L (ref 3.5–5.1)
Sodium: 133 mmol/L — ABNORMAL LOW (ref 135–145)

## 2023-12-19 LAB — GLUCOSE, CAPILLARY
Glucose-Capillary: 144 mg/dL — ABNORMAL HIGH (ref 70–99)
Glucose-Capillary: 85 mg/dL (ref 70–99)
Glucose-Capillary: 127 mg/dL — ABNORMAL HIGH (ref 70–99)
Glucose-Capillary: 163 mg/dL — ABNORMAL HIGH (ref 70–99)

## 2023-12-19 LAB — CBC
HCT: 44.6 % (ref 36.0–46.0)
Hemoglobin: 14 g/dL (ref 12.0–15.0)
MCH: 31.2 pg (ref 26.0–34.0)
MCHC: 31.4 g/dL (ref 30.0–36.0)
MCV: 99.3 fL (ref 80.0–100.0)
Platelets: 174 10*3/uL (ref 150–400)
RBC: 4.49 MIL/uL (ref 3.87–5.11)
RDW: 15.1 % (ref 11.5–15.5)
WBC: 13.7 10*3/uL — ABNORMAL HIGH (ref 4.0–10.5)
nRBC: 0 % (ref 0.0–0.2)

## 2023-12-19 LAB — HEPARIN LEVEL (UNFRACTIONATED): Heparin Unfractionated: 0.95 [IU]/mL — ABNORMAL HIGH (ref 0.30–0.70)

## 2023-12-19 LAB — APTT: aPTT: 70 s — ABNORMAL HIGH (ref 24–36)

## 2023-12-19 MED ORDER — METOPROLOL SUCCINATE ER 50 MG PO TB24
25.0000 mg | ORAL_TABLET | Freq: Every day | ORAL | Status: DC
  Administered 2023-12-19 – 2023-12-27 (×8): 25 mg via ORAL
  Filled 2023-12-19 (×9): qty 1

## 2023-12-19 MED ORDER — POTASSIUM CHLORIDE CRYS ER 20 MEQ PO TBCR
40.0000 meq | EXTENDED_RELEASE_TABLET | Freq: Once | ORAL | Status: AC
  Administered 2023-12-19: 40 meq via ORAL
  Filled 2023-12-19: qty 2

## 2023-12-19 MED ORDER — MELATONIN 5 MG PO TABS
5.0000 mg | ORAL_TABLET | Freq: Every evening | ORAL | Status: DC | PRN
  Administered 2023-12-24: 5 mg via ORAL
  Filled 2023-12-19: qty 1

## 2023-12-19 MED ORDER — APIXABAN 5 MG PO TABS
5.0000 mg | ORAL_TABLET | Freq: Two times a day (BID) | ORAL | Status: DC
  Administered 2023-12-19 – 2023-12-27 (×17): 5 mg via ORAL
  Filled 2023-12-19 (×17): qty 1

## 2023-12-19 MED ORDER — SERTRALINE HCL 25 MG PO TABS
25.0000 mg | ORAL_TABLET | Freq: Every day | ORAL | Status: DC
  Administered 2023-12-19 – 2023-12-27 (×9): 25 mg via ORAL
  Filled 2023-12-19 (×9): qty 1

## 2023-12-19 MED ORDER — DAPAGLIFLOZIN PROPANEDIOL 10 MG PO TABS
10.0000 mg | ORAL_TABLET | Freq: Every day | ORAL | Status: DC
  Administered 2023-12-19 – 2023-12-27 (×9): 10 mg via ORAL
  Filled 2023-12-19 (×9): qty 1

## 2023-12-19 NOTE — Progress Notes (Signed)
   12/19/23 0057  BiPAP/CPAP/SIPAP  BiPAP/CPAP/SIPAP Pt Type Adult  BiPAP/CPAP/SIPAP DREAMSTATIOND  Mask Type Full face mask  Mask Size Medium  Respiratory Rate 18 breaths/min  Flow Rate 1 lpm  Patient Home Machine No  Patient Home Mask No  Patient Home Tubing No  Auto Titrate Yes  Minimum cmH2O 5 cmH2O  Maximum cmH2O 20 cmH2O  Device Plugged into [employer] Power Outlet Yes

## 2023-12-19 NOTE — TOC Progression Note (Signed)
 Transition of Care [facility]) - Progression Note   Patient Details  Name: Heidi Stark MRN: [id number] Date of Birth: October 28, 1940  [facility] CM/SW Contact  Zenon Hilda, LCSW Phone Number: 12/19/2023, 2:20 PM  Clinical Narrative: PT evaluation updated to recommend [facility]. Hospitalist placed HHPT/OT orders. CSW notified Cindie with [place].  Expected Discharge Plan: Home w Home Health Services Barriers to Discharge: Continued Medical Work up  Expected Discharge Plan and Services In-house Referral: Clinical Social Work Post Acute Care Choice: Home Health Living arrangements for the past 2 months: Single Family Home          DME Arranged: N/A DME Agency: NA  Social Determinants of Health (SDOH) Interventions SDOH Screenings   Food Insecurity: No Food Insecurity (12/17/2023)  Housing: Low Risk  (12/17/2023)  Transportation Needs: No Transportation Needs (12/17/2023)  Utilities: Not At Risk (12/17/2023)  Alcohol Screen: Low Risk  (10/13/2021)  Depression (PHQ2-9): Low Risk  (09/14/2023)  Financial Resource Strain: Medium Risk (10/13/2021)  Physical Activity: Insufficiently Active (10/13/2021)  Social Connections: Moderately Integrated (12/17/2023)  Stress: No Stress Concern Present (10/13/2021)  Tobacco Use: Medium Risk (12/17/2023)   Readmission Risk Interventions    11/12/2023   10:09 AM  Readmission Risk Prevention Plan  Transportation Screening Complete  PCP or Specialist Appt within 3-5 Days Complete  HRI or Home Care Consult Complete  Social Work Consult for Recovery Care Planning/Counseling Complete  Palliative Care Screening Not Applicable  Medication Review Oceanographer) Complete

## 2023-12-19 NOTE — Consult Note (Addendum)
 Cardiology Consultation   Patient ID: DESHARA ROSSI MRN: [id number]; DOB: 12-31-1940  Admit date: 12/16/2023 Date of Consult: 12/19/2023  PCP:  Estill Hemming, DO   [facility] Providers Cardiologist:  Peder Bourdon, MD  Electrophysiologist:  Will Cortland Ding, MD       Patient Profile: ODELLE KOSIER is a 83 y.o. female with a history of no significant CAD on cardiac catheterization in 08/2020, chronic HFrEF with a EF as low as 30-35% in 2018 but has since normalized, paroxysmal atrial fibrillation/ flutter s/p atrial fibrillation ablation in 2018 with recurrence requiring multiple DCCVs (most recently in 10/2022 ) on Eliquis , VT initially while on Tikosyn  in 08/2020 but had recurrence after this was stopped s/p Medtronic ICD in 09/2020, PAD with a known occluded left external iliac artery, splenic and renal infarcts in 10/2013, hypertension, hyperlipidemia, type 2 diabetes, hypothyroidism, CKD stage III, obstructive sleep apnea on CPAP, and chronic low back pain who is being seen 12/19/2023 for the evaluation of low BP and assistance with medications at the request of Dr. Gwynneth Lessen.  History of Present Illness: Ms. Hunsucker is a 83 year old female with a complex history as above above who is followed by Dr. Mitzie Anda and Dr. Lawana Pray.  She has a long history of atrial fibrillation and CHF.  She was admitted in 07/2016 with atrial fibrillation with RVR and acute systolic CHF.  EF was 30-35% at that time.  She underwent DCCV with return of sinus rhythm and then ultimately had an atrial fibrillation in 10/2016.  Unfortunately, she had recurrent atrial fibrillation/ flutter and has undergone multiple DCCV since then. Repeat Echo in 12/2017 showed normalization of EF to 55%.  She was admitted again in 04/2018 with recurrent atrial fibrillation.  She was started on Tikosyn  at that time but this had to be stopped in 08/2020 due to VT and she was started on Amiodarone  instead.  Echo at that time showed  LVEF of 50-55% but cardiac MRI showed EF of 40% with subendocardial scar in the basal to mid inferolateral wall and RVEF of 38%.  LHC at that time showed no significant CAD.  She was readmitted later that same month with presyncopal events.  She had no documented VT but did have atypical atrial flutter. She ultimately had a Medtronic ICD placed in 09/2020.  She had multiple episodes of VT in 08/2021 which was treated with ATP but no shocks.  She was started on Mexiletine at that time.  In 11/2021, she had VT with ATP and 1 ICD shock but had been off Amiodarone  and Mexiletine at this time.  Last Echo in 02/2022 showed LVEF of 60-65% with normal wall motion, moderate LVH, and grade 2 diastolic dysfunction.  Last DCCV was in 10/2022.  She was last seen in the advanced CHF clinic in 09/2023 at which time she was feeling well overall with NYHA class III symptoms.  She is compliant with all her medications her weight was down 20 pounds.  She was admitted in 10/2023 presenting with right knee pain after fall.  She was diagnosed with a left lower extremity cellulitis had a large right knee effusion on CT scan.  She was started on IV antibiotics and underwent an arthrocentesis x 2 with bloody effusion noted on both times.  She was seen by Dr. Lawana Pray on 11/26/2023 at which time she was stable from a cardiac standpoint.  TSH was checked at that visit and was markedly elevated at 20.3.  However, it was  recommended that she stay on amiodarone  given her history of VT and she was advised to follow-up with her PCP.  Patient presented back to the ED on 12/16/2023 for further evaluation of generalized weakness as well as redness/swelling of right elbow.  WBC was 16.8 and lactic acid was 1.9.  Blood cultures were obtained and she was started on empiric antibiotics due to concerns for right elbow cellulitis first olecranon bursitis.  Blood cultures negative to date.  BP was initially running soft, therefore Cardiology consulted to  assist with cardiac medications at family's request.  She has been doing well from a cardiac standpoint recently.  She denies any chest pain, shortness of breath, orthopnea, PND, edema, palpitations, lightheadedness, dizziness, syncope.  She ambulates with a walker at home and states her activity is limited by her back pain not her shortness of breath.  She denies any fevers at home.  She does report an occasional productive cough as well as some nausea lately.  However, no vomiting or diarrhea.  No abnormal bleeding in urine or stools.  She does states she has not had much of an appetite lately but states she has been trying to at least drink fluids.  She states she was asymptomatic when her BP was soft earlier this admission.  Past Medical History:  Diagnosis Date   Arthritis    "hands, wrists" (11/07/2016)   CHF (congestive heart failure) (HCC)    Chronic lower back pain    Constipation    DVT (deep venous thrombosis) (HCC)    History of blood transfusion 1990   "related to OR"   History of kidney stones    Hyperlipidemia    Hypertension    Joint pain    Lower extremity edema    OSA on CPAP    Persistent atrial fibrillation (HCC)    Pneumonia    "several times" (11/07/2016)   Type II diabetes mellitus (HCC)     Past Surgical History:  Procedure Laterality Date   ATRIAL FIBRILLATION ABLATION N/A 11/07/2016   Procedure: Atrial Fibrillation Ablation;  Surgeon: Will Cortland Ding, MD;  Location: [facility] INVASIVE CV LAB;  Service: Cardiovascular;  Laterality: N/A;   BACK SURGERY     CARDIAC CATHETERIZATION     CARDIOVERSION N/A 08/11/2016   Procedure: CARDIOVERSION;  Surgeon: Darlis Eisenmenger, MD;  Location: [facility] ENDOSCOPY;  Service: Cardiovascular;  Laterality: N/A;   CARDIOVERSION N/A 12/03/2017   Procedure: CARDIOVERSION;  Surgeon: Darlis Eisenmenger, MD;  Location: [facility] ENDOSCOPY;  Service: Cardiovascular;  Laterality: N/A;   CARDIOVERSION N/A 05/02/2018   Procedure: CARDIOVERSION;   Surgeon: Hazle Lites, MD;  Location: [facility] ENDOSCOPY;  Service: Cardiovascular;  Laterality: N/A;   CARDIOVERSION N/A 06/23/2020   Procedure: CARDIOVERSION;  Surgeon: Sonny Dust, MD;  Location: [facility] ENDOSCOPY;  Service: Cardiovascular;  Laterality: N/A;   CARDIOVERSION N/A 10/11/2020   Procedure: CARDIOVERSION;  Surgeon: Darlis Eisenmenger, MD;  Location: [facility] ENDOSCOPY;  Service: Cardiovascular;  Laterality: N/A;   CARPAL TUNNEL RELEASE     CATARACT EXTRACTION W/ INTRAOCULAR LENS  IMPLANT, BILATERAL Bilateral    COLON SURGERY  1990   vein graft and colon repair after nicked artery with back surgert   COLONOSCOPY     CORONARY ANGIOGRAPHY N/A 09/08/2020   Procedure: CORONARY ANGIOGRAPHY;  Surgeon: Darlis Eisenmenger, MD;  Location: [name] INVASIVE CV LAB;  Service: Cardiovascular;  Laterality: N/A;   CYSTECTOMY     between bladder and kidneys   GANGLION CYST EXCISION Left  ICD IMPLANT N/A 09/15/2020   Procedure: ICD IMPLANT;  Surgeon: Lei Pump, MD;  Location: [facility] INVASIVE CV LAB;  Service: Cardiovascular;  Laterality: N/A;   KNEE CARTILAGE SURGERY Right 1960s   LUMBAR DISC SURGERY  1990   REPAIR ILIAC ARTERY  1990   vein graft and colon repair after nicked artery with back surgert   TEE WITHOUT CARDIOVERSION N/A 10/31/2013   Procedure: TRANSESOPHAGEAL ECHOCARDIOGRAM (TEE);  Surgeon: Darlis Eisenmenger, MD;  Location: [facility] ENDOSCOPY;  Service: Cardiovascular;  Laterality: N/A;   TUBAL LIGATION       Home Medications:  Prior to Admission medications   Medication Sig Start Date End Date Taking? Authorizing Provider  acetaminophen  (TYLENOL ) 500 MG tablet Take 500 mg by mouth every 6 (six) hours as needed for moderate pain (pain score 4-6) or headache.   Yes [provider]  amiodarone  (PACERONE ) 200 MG tablet TAKE 1 TABLET BY MOUTH EVERY DAY 08/23/23  Yes McLean, Dalton S, MD  cefadroxil (DURICEF) 500 MG capsule Take 500 mg by mouth 3 (three) times daily. 12/16/23  Yes  [provider]  ELIQUIS  5 MG TABS tablet TAKE 1 TABLET BY MOUTH TWICE A DAY 06/12/23  Yes Darlis Eisenmenger, MD  ENTRESTO  24-26 MG Take 1 tablet by mouth 2 (two) times daily. 10/15/23  Yes [provider]  eplerenone  (INSPRA ) 25 MG tablet TAKE 1 TABLET BY MOUTH EVERY DAY 02/21/23  Yes Darlis Eisenmenger, MD  fenofibrate  (TRICOR ) 145 MG tablet TAKE 1 TABLET BY MOUTH EVERY DAY 01/22/23  Yes Darlis Eisenmenger, MD  furosemide  (LASIX ) 40 MG tablet Take 40 mg by mouth 2 (two) times daily. Take 2 tablets by mouth twice daily 11/15/23  Yes [provider]  gabapentin  (NEURONTIN ) 300 MG capsule Take 300 mg by mouth 2 (two) times daily as needed (pain). 07/31/22  Yes [provider]  HUMALOG  MIX 75/25 KWIKPEN (75-25) 100 UNIT/ML KwikPen Take 40 units with morning meal and 30 units with evening meal Patient taking differently: Inject 30 Units into the skin 2 (two) times daily as needed (hyperglycemia). 05/31/22  Yes Roel Clarity R, DO  levothyroxine  (SYNTHROID ) 50 MCG tablet Take 1 tablet (50 mcg total) by mouth daily. 12/03/23  Yes Roel Clarity R, DO  metoprolol  succinate (TOPROL -XL) 25 MG 24 hr tablet TAKE 1 TABLET BY MOUTH EVERYDAY AT BEDTIME 04/23/23  Yes Diaz, Alma L, NP  mexiletine (MEXITIL ) 150 MG capsule TAKE 2 CAPSULES (300 MG TOTAL) BY MOUTH 2 (TWO) TIMES DAILY. 05/10/23  Yes Camnitz, Will Gaylyn Keas, MD  MOUNJARO 2.5 MG/0.5ML Pen Inject 2.5 mg into the skin once a week. Thursdays   Yes [provider]  Multiple Vitamin (MULTIVITAMIN WITH MINERALS) TABS tablet Take 1 tablet by mouth daily.   Yes [provider]  nicotine  (NICODERM CQ  - DOSED IN MG/24 HOURS) 21 mg/24hr patch Place 1 patch (21 mg total) onto the skin daily as needed (Nicotine  withdrawal). 11/14/23  Yes Pudota, Mordecai Applebaum, MD  Pitavastatin  Calcium  4 MG TABS TAKE 1 TABLET BY MOUTH EVERY DAY 10/29/23  Yes Darlis Eisenmenger, MD  potassium chloride  (MICRO-K ) 10 MEQ CR capsule Take 10 mEq by  mouth 2 (two) times daily. 11/14/23  Yes [provider]  sertraline  (ZOLOFT ) 25 MG tablet TAKE 1 TABLET (25 MG TOTAL) BY MOUTH DAILY. 10/15/23  Yes Roel Clarity R, DO  VASCEPA  1 g capsule TAKE 2 CAPSULES BY MOUTH TWICE A DAY 10/10/23  Yes Darlis Eisenmenger,  MD  BD PEN NEEDLE NANO 2ND GEN 32G X 4 MM MISC See admin instructions. 09/24/21   [provider]  dapagliflozin  propanediol (FARXIGA ) 10 MG TABS tablet TAKE 1 TABLET BY MOUTH EVERY DAY BEFORE BREAKFAST 05/31/22   Crecencio Dodge, Yvonne R, DO  ONETOUCH VERIO test strip 1 each 3 (three) times daily. 10/09/21   [provider]    Scheduled Meds:  acetaminophen   1,000 mg Oral TID   amiodarone   200 mg Oral Daily   fenofibrate   160 mg Oral Daily   icosapent  Ethyl  2 g Oral BID   insulin  aspart  0-9 Units Subcutaneous TID WC   insulin  aspart protamine - aspart  10 Units Subcutaneous BID WC   levothyroxine   50 mcg Oral Daily   mexiletine  300 mg Oral BID   pravastatin   80 mg Oral q1800   Continuous Infusions:   ceFAZolin  (ANCEF ) IV 2 g (12/19/23 0506)   heparin  1,400 Units/hr (12/18/23 1748)   PRN Meds: oxyCODONE , sodium chloride  flush  Allergies:    Allergies  Allergen Reactions   Neomycin-Bacitracin Zn-Polymyx Rash   Sulfa Antibiotics Diarrhea and Nausea And Vomiting   Ciprofloxacin  Hives, Itching and Rash    Pt doesn't remember having any reaction to cipro    Spironolactone  Rash    Social History:   Social History   Socioeconomic History   Marital status: Married    Spouse name: Charles   Number of children: 2   Years of education: Not on file   Highest education level: Not on file  Occupational History   Occupation: Retired  Tobacco Use   Smoking status: Former    Current packs/day: 0.00    Average packs/day: 1 pack/day for 50.0 years (50.0 ttl pk-yrs)    Types: Cigarettes    Start date: 08/17/1970    Quit date: 08/17/2020    Years since quitting: 3.3   Smokeless tobacco: Never   Tobacco  comments:    Quit 2022. Tay  Vaping Use   Vaping status: Never Used  Substance and Sexual Activity   Alcohol use: No    Alcohol/week: 0.0 standard drinks of alcohol   Drug use: No   Sexual activity: Not on file  Other Topics Concern   Not on file  Social History Narrative   Married x 67 years.    1 son and 1 daughter.   1 grandson.   Social Drivers of Health   Financial Resource Strain: Medium Risk (10/13/2021)   Overall Financial Resource Strain (CARDIA)    Difficulty of Paying Living Expenses: Somewhat hard  Food Insecurity: No Food Insecurity (12/17/2023)   Hunger Vital Sign    Worried About Running Out of Food in the Last Year: Never true    Ran Out of Food in the Last Year: Never true  Transportation Needs: No Transportation Needs (12/17/2023)   PRAPARE - Administrator, Civil Service (Medical): No    Lack of Transportation (Non-Medical): No  Physical Activity: Insufficiently Active (10/13/2021)   Exercise Vital Sign    Days of Exercise per Week: 2 days    Minutes of Exercise per Session: 20 min  Stress: No Stress Concern Present (10/13/2021)   [employer] of Occupational Health - Occupational Stress Questionnaire    Feeling of Stress : Not at all  Social Connections: Moderately Integrated (12/17/2023)   Social Connection and Isolation Panel [NHANES]    Frequency of Communication with Friends and Family: More than three times a week  Frequency of Social Gatherings with Friends and Family: More than three times a week    Attends Religious Services: 1 to 4 times per year    Active Member of Clubs or Organizations: No    Attends Banker Meetings: 1 to 4 times per year    Marital Status: Widowed  Intimate Partner Violence: Not At Risk (12/17/2023)   Humiliation, Afraid, Rape, and Kick questionnaire    Fear of Current or Ex-Partner: No    Emotionally Abused: No    Physically Abused: No    Sexually Abused: No    Family History:   Family History   Problem Relation Age of Onset   Diabetes Mother    Hypertension Mother    Cancer Mother    Obesity Mother    Diabetes Father    Obesity Father    Diabetes Other    Melanoma Other    Factor V Leiden deficiency Daughter      ROS:  Please see the history of present illness.    Physical Exam/Data: Vitals:   12/18/23 0456 12/18/23 1259 12/18/23 2054 12/19/23 0637  BP: (!) 93/56 102/66 108/67 118/80  Pulse: (!) 55 70 65 69  Resp: 16 16 18    Temp: 97.9 F (36.6 C) 98.5 F (36.9 C) 98.6 F (37 C) 98.2 F (36.8 C)  TempSrc: Oral Oral Oral Oral  SpO2: 97% 94% 96% 93%  Weight:      Height:        Intake/Output Summary (Last 24 hours) at 12/19/2023 0856 Last data filed at 12/19/2023 0600 Gross per 24 hour  Intake 995.49 ml  Output 1100 ml  Net -104.51 ml      12/17/2023    5:05 AM 11/26/2023    8:46 AM 11/14/2023    5:57 AM  Last 3 Weights  Weight (lbs) 254 lb 3.1 oz 250 lb 269 lb 13.5 oz  Weight (kg) 115.3 kg 113.399 kg 122.4 kg     Body mass index is 41.03 kg/m.  General: 83 y.o.morbidly obese Caucasian female resting comfortably in no acute distress. HEENT: Normocephalic and atraumatic. Sclera clear. EOMs intact. Neck: Supple. JVD difficult to assess due to body habitus. Heart: Irregular rhythm with normal rate. No murmurs, gallops, or rubs.  Lungs: No increased work of breathing. Faint crackles noted in bilateral bases (suspect atelectasis) and some scattered faint expiratory wheezes that improved with coughing.   Extremities: No lower extremity edema.    Skin: Warm and dry. Neuro: Alert and oriented x3. No focal deficits. Psych: Normal affect. Responds appropriately.   EKG:  The EKG was personally reviewed and demonstrates:  Atrial flutter, rate 76 bpm, and RBBB and non-specific ST/ T wave changes. Telemetry:  Telemetry was personally reviewed and demonstrates:  Atrial flutter with rates in the 60s to 70s.  Relevant CV Studies:  Echocardiogram  12/18/2023: Impressions:  1. Left ventricular ejection fraction, by estimation, is 70 to 75%. The  left ventricle has hyperdynamic function. The left ventricle has no  regional wall motion abnormalities. There is moderate concentric left  ventricular hypertrophy. Left ventricular  diastolic function could not be evaluated.   2. Right ventricular systolic function was not well visualized. The right  ventricular size is not well visualized. There is mildly elevated  pulmonary artery systolic pressure. The estimated right ventricular  systolic pressure is 37.1 mmHg.   3. Left atrial size was severely dilated.   4. The mitral valve is degenerative. No evidence of mitral valve  regurgitation. No evidence of mitral stenosis. Moderate mitral annular  calcification.   5. The aortic valve is tricuspid. There is mild calcification of the  aortic valve. There is mild thickening of the aortic valve. Aortic valve  regurgitation is not visualized. Aortic valve sclerosis/calcification is  present, without any evidence of  aortic stenosis.   6. There is borderline dilatation of the ascending aorta, measuring 38  mm.   7. The inferior vena cava is normal in size with greater than 50%  respiratory variability, suggesting right atrial pressure of 3 mmHg.   Laboratory Data: High Sensitivity Troponin:  No results for input(s): "TROPONINIHS" in the last 720 hours.   Chemistry Recent Labs  Lab 12/16/23 2351 12/17/23 0433 12/18/23 0513 12/19/23 0123  NA 133* 134*  --  133*  K 3.7 3.5  --  3.8  CL 97* 98  --  97*  CO2 24 24  --  25  GLUCOSE 151* 137*  --  121*  BUN 18 19  --  25*  CREATININE 0.99 1.04* 1.66* 1.27*  CALCIUM  7.9* 8.0*  --  7.7*  GFRNONAA 57* 54* 31* 42*  ANIONGAP 12 12  --  11    Recent Labs  Lab 12/16/23 2351  PROT 6.9  ALBUMIN 3.5  AST 38  ALT 16  ALKPHOS 52  BILITOT 1.2   Lipids No results for input(s): "CHOL", "TRIG", "HDL", "LABVLDL", "LDLCALC", "CHOLHDL" in the last  168 hours.  Hematology Recent Labs  Lab 12/17/23 0433 12/18/23 0513 12/19/23 0123  WBC 14.1* 14.5* 13.7*  RBC 4.92 4.44 4.49  HGB 15.1* 13.8 14.0  HCT 48.4* 44.7 44.6  MCV 98.4 100.7* 99.3  MCH 30.7 31.1 31.2  MCHC 31.2 30.9 31.4  RDW 15.1 15.0 15.1  PLT 164 159 174   Thyroid  No results for input(s): "TSH", "FREET4" in the last 168 hours.  BNPNo results for input(s): "BNP", "PROBNP" in the last 168 hours.  DDimer No results for input(s): "DDIMER" in the last 168 hours.  Radiology/Studies:  ECHOCARDIOGRAM COMPLETE Result Date: 12/18/2023    ECHOCARDIOGRAM REPORT   Patient Name:   DYLLAN KATS Date of Exam: 12/18/2023 Medical Rec #:  [id number]      Height:       66.0 in Accession #:    [id number]     Weight:       254.2 lb Date of Birth:  1940/12/02      BSA:          2.214 m Patient Age:    82 years       BP:           93/56 mmHg Patient Gender: F              HR:           101 bpm. Exam Location:  Inpatient Procedure: Intracardiac Opacification Agent, 2D Echo, Cardiac Doppler and Color            Doppler (Both Spectral and Color Flow Doppler were utilized during            procedure). Indications:    Abnormal ECG 794.31/ R94.31  History:        Patient has prior history of Echocardiogram examinations, most                 recent 02/22/2022. CHF, COPD; Risk Factors:Sleep Apnea, Former                 Smoker,  Hypertension, Dyslipidemia and Diabetes.  Sonographer:    Kip Peon RDCS Referring Phys: [id number] [facility]  Sonographer Comments: Technically challenging study due to limited acoustic windows. Image acquisition challenging due to respiratory motion. IMPRESSIONS  1. Left ventricular ejection fraction, by estimation, is 70 to 75%. The left ventricle has hyperdynamic function. The left ventricle has no regional wall motion abnormalities. There is moderate concentric left ventricular hypertrophy. Left ventricular diastolic function could not be evaluated.  2. Right ventricular systolic  function was not well visualized. The right ventricular size is not well visualized. There is mildly elevated pulmonary artery systolic pressure. The estimated right ventricular systolic pressure is 37.1 mmHg.  3. Left atrial size was severely dilated.  4. The mitral valve is degenerative. No evidence of mitral valve regurgitation. No evidence of mitral stenosis. Moderate mitral annular calcification.  5. The aortic valve is tricuspid. There is mild calcification of the aortic valve. There is mild thickening of the aortic valve. Aortic valve regurgitation is not visualized. Aortic valve sclerosis/calcification is present, without any evidence of aortic stenosis.  6. There is borderline dilatation of the ascending aorta, measuring 38 mm.  7. The inferior vena cava is normal in size with greater than 50% respiratory variability, suggesting right atrial pressure of 3 mmHg. FINDINGS  Left Ventricle: Left ventricular ejection fraction, by estimation, is 70 to 75%. The left ventricle has hyperdynamic function. The left ventricle has no regional wall motion abnormalities. The left ventricular internal cavity size was normal in size. There is moderate concentric left ventricular hypertrophy. Left ventricular diastolic function could not be evaluated due to atrial fibrillation. Left ventricular diastolic function could not be evaluated. Right Ventricle: The right ventricular size is not well visualized. Right vetricular wall thickness was not well visualized. Right ventricular systolic function was not well visualized. There is mildly elevated pulmonary artery systolic pressure. The tricuspid regurgitant velocity is 2.35 m/s, and with an assumed right atrial pressure of 15 mmHg, the estimated right ventricular systolic pressure is 37.1 mmHg. Left Atrium: Left atrial size was severely dilated. Right Atrium: Right atrial size was not well visualized. Pericardium: There is no evidence of pericardial effusion. Mitral Valve: The  mitral valve is degenerative in appearance. Moderate mitral annular calcification. No evidence of mitral valve regurgitation. No evidence of mitral valve stenosis. Tricuspid Valve: The tricuspid valve is normal in structure. Tricuspid valve regurgitation is mild. Aortic Valve: The aortic valve is tricuspid. There is mild calcification of the aortic valve. There is mild thickening of the aortic valve. Aortic valve regurgitation is not visualized. Aortic valve sclerosis/calcification is present, without any evidence of aortic stenosis. Pulmonic Valve: The pulmonic valve was grossly normal. Pulmonic valve regurgitation is not visualized. Aorta: The aortic root is normal in size and structure. There is borderline dilatation of the ascending aorta, measuring 38 mm. Venous: The inferior vena cava is normal in size with greater than 50% respiratory variability, suggesting right atrial pressure of 3 mmHg. IAS/Shunts: The interatrial septum was not well visualized. Additional Comments: A device lead is visualized in the right ventricle.  LEFT VENTRICLE PLAX 2D LVIDd:         5.40 cm   Diastology LVIDs:         3.60 cm   LV e' medial:    5.70 cm/s LV PW:         1.45 cm   LV E/e' medial:  21.2 LV IVS:        1.50 cm   LV  e' lateral:   11.00 cm/s LVOT diam:     1.90 cm   LV E/e' lateral: 11.0 LV SV:         62 LV SV Index:   28 LVOT Area:     2.84 cm  RIGHT VENTRICLE            IVC RV S prime:     9.30 cm/s  IVC diam: 2.40 cm LEFT ATRIUM           Index LA diam:      5.30 cm 2.39 cm/m LA Vol (A2C): 60.1 ml 27.14 ml/m  AORTIC VALVE LVOT Vmax:   82.60 cm/s LVOT Vmean:  55.700 cm/s LVOT VTI:    0.218 m  AORTA Ao Root diam: 3.20 cm Ao Asc diam:  3.80 cm MITRAL VALVE                TRICUSPID VALVE MV Area (PHT): 3.60 cm     TR Peak grad:   22.1 mmHg MV Decel Time: 211 msec     TR Vmax:        235.00 cm/s MV E velocity: 121.00 cm/s MV A velocity: 40.50 cm/s   SHUNTS MV E/A ratio:  2.99         Systemic VTI:  0.22 m                              Systemic Diam: 1.90 cm Karyl Paget Croitoru MD Electronically signed by Luana Rumple MD Signature Date/Time: 12/18/2023/1:32:23 PM    Final    DG Chest Port 1 View Result Date: 12/17/2023 EXAM: 1 VIEW XRAY OF THE CHEST 12/17/2023 12:46:04 AM COMPARISON: 05/14/2023 CLINICAL HISTORY: Questionable sepsis - evaluate for abnormality. Pt comes from home via [facility] for generalized weakness. Pt had redness and swelling to R elbow, started on antibiotic today for it. FINDINGS: LUNGS AND PLEURA: No focal pulmonary opacity. No pulmonary edema. No pleural effusion. No pneumothorax. HEART AND MEDIASTINUM: Stable cardiomegaly. BONES AND SOFT TISSUES: No acute osseous abnormality. VASCULATURE: Thoracic aortic atherosclerosis. Left subclavian ICD in place. IMPRESSION: 1. No acute findings. 2. Stable cardiomegaly. Electronically signed by: Zadie Herter MD 12/17/2023 12:48 AM EDT RP Workstation: [id number]     Assessment and Plan:  Chronic HFrEF Patient with a long history of CHF.  EF as low as 30-35% in 2018 in setting of rapid atrial fibrillation. EF has subsequently normalized.  Echo this admission showed EF of 75 to 75% with no regional wall motion abnormalities and moderate LVH, severe left atrial enlargement, moderate MAC but no MR or MS, and mildly elevated PASP of 37.1 mmHg. - Euvolemic on exam.  - Home medications include: Lasix  40mg  twice daily, Entresto  24-26 mg twice daily, Toprol  XL 25 mg daily, Eplerenone  25 mg daily, and Farxiga  10 mg daily.  However, all of these were held on admission due to low BP.  Systolic BP as low as 89 yesterday but looks better today. Will restart Toprol -XL 25mg  daily and Farxiga  10mg  daily. If BP tolerates this okay, can hopefully restarted Entresto  +/- Eplerenone  tomorrow. - Monitor volume status closely.  Paroxysmal Atrial Fibrillation/ Flutter Initially diagnosed with atrial relation in 2018 and underwent an ablation in 10/2016.  She has had recurrent atrial  fibrillation and flutter since that time and has undergone multiple cardioversions.  She was started on Tikosyn  in 2019 but this had to be stopped in 2022 due to VT.  She subsequently been on Amiodarone  and Mexiletine.  EKG on admission showed rate controlled atrial flutter with rates in the 70s. - Still in atrial flutter with rates in the 60s to 70s. - Will restart Toprol -XL 25mg  daily. - Continue Amiodarone  200 mg daily. - Continue Mexiletine 300 mg twice daily. - Currently on IV Heparin  but it does not sound like Ortho is planning any surgery so could go back to home Eliquis  5 mg twice daily. - Given she is asymptomatic with this, would favor letting her recover from current infection and then considering outpatient DCCV if she is still in atrial flutter at follow-up visit.  VT Initially diagnosed in 08/2020 while on Tikosyn .  This was stopped but she did have recurrent VT.  Medtronic ICD was placed in 09/2020 and she has been maintained on Amiodarone  and Mexiletine. - No VT on telemetry.  - Potassium 3.8. Will replete to try to keep > 4.0.  - Will restart Toprol -XL 25mg  daily. - Continue Amiodarone  200mg  daily. - Continue Mexiletine 300mg  twice daily.  PAD Patient has history of damage to left iliac artery during a prior surgery.  CTA in 11/2022 showed limited right anterior tibial and peroneal arteries as well as occluded left CIA/EIA/internal iliac artery with reconstitution of SFA. - Followed by Vascular Surgery (Dr. Vikki Graves).  Hypertension History of hypertension but BP soft on admission.  - Continue medications for CHF as above.  Hyperlipidemia - Continue Pravastatin  80mg  daily, Vascepa  2g twice daily, and Fenofibrate  160mg  daily.  CKD Stage III Baseline creatinine around 1.0. Creatinine was 1.04 on admission but jumped to 1.66 on 12/18/2023. - Improving. Creatinine 1.27 today. - Continue to monitor.  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 The patient's score is based upon: CHF History: 1 HTN History: 1 Diabetes History: 1 Stroke History: 0 Vascular Disease History: 1 Age Score: 2 Gender Score: 1    For questions or updates, please contact [facility] Please consult [email or web address] for contact info under  Signed, Callie E Goodrich, PA-C  12/19/2023 8:56 AM  Patient seen and examined, note reviewed with the signed Advanced Practice Provider. I personally reviewed laboratory data, imaging studies and relevant notes. I independently examined the patient and formulated the important aspects of the plan. I have personally discussed the plan with the patient and/or family. Comments or changes to the note/plan are indicated below.  Chronic heart failure with reduced ejection fraction-does not clinically appear to be volume overloaded.  At this time is going to be managing her medications.  Most of her blood pressure affecting medications were held at the time of her admission due to hypotension.  She is improving we will reinitiate some of her medication today.  Will start Toprol  XL 25 mg daily today along with Farxiga  and monitor her blood pressure closely.  If can tolerate we will continue adding on her Entresto  and eplerenone .  Paroxysmal atrial fibrillation-she is in rate controlled flutter.  We are restarting her Toprol  XL, continuing her amiodarone  and mexiletine.  Will continue to monitor.  She is on heparin  drip likely plan for surgery.  Once she has completed her  surgical intervention prior to discharge please restart her Eliquis .  History of V. tach-currently on  amiodarone  and mexiletine.  Added her beta-blocker today.  PAD-stable  Hypertension recently with soft blood pressure will continue to monitor  Hyperlipidemia continue lipid-lowering agents with pravastatin  80 mg daily, Vascepa  2 g twice a day as well as fenofibrate  160 mg daily.  Chronic kidney disease stage III creatinine 1.27 we will continue to monitor   Allecia Bells DO, MS [facility] Attending Cardiologist [facility]  [address] [place], [place] [address] [phone] Website: [email or web address]

## 2023-12-19 NOTE — Hospital Course (Signed)
 Heidi Stark is a 83 y.o. female with a history of morbid obesity, diabetes mellitus type 2, hypertension, hyperlipidemia, obstructive sleep apnea on CPAP, consult heart failure, atrial fibrillation, ventricular tachycardia status post ICD placement, CKD, hypothyroidism.  Patient presented secondary to right elbow swelling and pain with evidence of right elbow cellulitis.  Empiric antibiotics started.  Orthopedic surgery was consulted secondary to right elbow swelling with recommendation for no surgical intervention.

## 2023-12-19 NOTE — Progress Notes (Signed)
 Physical Therapy Treatment Patient Details Name: Heidi Stark MRN: [id number] DOB: 06-Apr-1941 Today's Date: 12/19/2023   History of Present Illness Heidi Stark is a 83 y.o. female presents to the hospital with Rt elbow swelling and pain. Admitted for Rt elbow cellulitis to be treated with IV antibiotics. PMH significant of with past medical history of HTN, DM 2, HLD, P A-fib on Eliquis , splenic and renal infarcts, congestive heart failure with recovered ejection fraction 65%, hypothyroidism, history of VT.    PT Comments  Pt seen for PT tx with daughter present, providing encouragement, assistance throughout session. Pt is pleasant & agreeable to tx. Pt completes bed mobility with supervision but HOB significantly elevated, use of bed rails, & extra time. Pt is able to complete sit<>stand from EOB & recliner with RW & min assist, cuing re: hand placement. Pt is able to progress to ambulating short distances in room with RW & min assist; gait distance limited by chronic back pain. Provided pt & gait belt for them to use at home. Will continue to follow pt acutely to progress mobility as able.    If plan is discharge home, recommend the following: A little help with walking and/or transfers;A little help with bathing/dressing/bathroom;Assistance with cooking/housework;Assist for transportation;Help with stairs or ramp for entrance   Can travel by private vehicle     Yes  Equipment Recommendations  None recommended by PT    Recommendations for Other Services       Precautions / Restrictions Precautions Precautions: Fall Restrictions Weight Bearing Restrictions Per Provider Order: No     Mobility  Bed Mobility Overal bed mobility: Needs Assistance Bed Mobility: Supine to Sit     Supine to sit: Supervision, HOB elevated, Used rails (HOB significantly elevated, extra tiem)          Transfers Overall transfer level: Needs assistance Equipment used: Rolling walker (2  wheels) Transfers: Sit to/from Stand, Bed to chair/wheelchair/BSC Sit to Stand: Min assist   Step pivot transfers: Min assist (cuing to keep RW close vs pushing it out in front of her)       General transfer comment: sit<>stand from EOB & recliner, cuing to scoot out to edge of seat, push with LUE with RUE on RW, pt rocks to standing    Ambulation/Gait Ambulation/Gait assistance: Min assist Gait Distance (Feet): 6 Feet (+ 10 ft) Assistive device: Rolling walker (2 wheels) Gait Pattern/deviations: Decreased step length - right, Decreased step length - left, Decreased stride length Gait velocity: decreased     General Gait Details: cuing to keep RW close vs pushing it out in front of her, pt leans heavily on RW with BUE   Stairs             Wheelchair Mobility     Tilt Bed    Modified Rankin (Stroke Patients Only)       Balance Overall balance assessment: Needs assistance Sitting-balance support: Feet supported, Bilateral upper extremity supported Sitting balance-Leahy Scale: Fair     Standing balance support: Bilateral upper extremity supported, During functional activity, Reliant on assistive device for balance Standing balance-Leahy Scale: Fair                              Hotel manager: No apparent difficulties  Cognition Arousal: Alert Behavior During Therapy: WFL for tasks assessed/performed   PT - Cognitive impairments: No apparent impairments  PT - Cognition Comments: labile (upset over husband's recent passing with daughter/PT providing encouragement, support throughout session) Following commands: Intact      Cueing Cueing Techniques: Verbal cues  Exercises      General Comments        Pertinent Vitals/Pain Pain Assessment Pain Assessment: Faces Faces Pain Scale: Hurts a little bit Pain Location: R elbow, chronic back pain Pain Descriptors / Indicators:  Discomfort Pain Intervention(s): Monitored during session    Home Living                          Prior Function            PT Goals (current goals can now be found in the care plan section) Acute Rehab PT Goals Patient Stated Goal: get well enough to go home PT Goal Formulation: With patient Time For Goal Achievement: 12/31/23 Potential to Achieve Goals: Good Progress towards PT goals: Progressing toward goals    Frequency    Min 2X/week      PT Plan      Co-evaluation              AM-PAC PT "6 Clicks" Mobility   Outcome Measure  Help needed turning from your back to your side while in a flat bed without using bedrails?: A Little Help needed moving from lying on your back to sitting on the side of a flat bed without using bedrails?: A Lot Help needed moving to and from a bed to a chair (including a wheelchair)?: A Little Help needed standing up from a chair using your arms (e.g., wheelchair or bedside chair)?: A Little Help needed to walk in hospital room?: A Little Help needed climbing 3-5 steps with a railing? : A Lot 6 Click Score: 16    End of Session Equipment Utilized During Treatment: Gait belt Activity Tolerance: Patient tolerated treatment well Patient left: in chair;with call bell/phone within reach;with family/visitor present Nurse Communication: Mobility status PT Visit Diagnosis: Other abnormalities of gait and mobility (R26.89);Muscle weakness (generalized) (M62.81);Difficulty in walking, not elsewhere classified (R26.2)     Time: 1610-9604 PT Time Calculation (min) (ACUTE ONLY): 23 min  Charges:    $Therapeutic Activity: 23-37 mins PT General Charges $$ ACUTE PT VISIT: 1 Visit                     Emaline Handsome, PT, DPT 12/19/23, 1:13 PM   Venetta Gill 12/19/2023, 1:10 PM

## 2023-12-19 NOTE — Progress Notes (Signed)
   12/19/23 2226  BiPAP/CPAP/SIPAP  BiPAP/CPAP/SIPAP Pt Type Adult  BiPAP/CPAP/SIPAP DREAMSTATIOND  Mask Type Full face mask  Dentures removed? Yes - Placed in denture cup  Mask Size Medium  Respiratory Rate 21 breaths/min  Flow Rate 2 lpm (bled into circuit)  Patient Home Machine No  Patient Home Mask No  Patient Home Tubing No  Auto Titrate Yes  Minimum cmH2O 5 cmH2O  Maximum cmH2O 20 cmH2O  CPAP/SIPAP surface wiped down Yes  Device Plugged into RED Power Outlet Yes  BiPAP/CPAP /SiPAP Vitals  Resp (!) 21  MEWS Score/Color  MEWS Score 1  MEWS Score Color Marrie Sizer

## 2023-12-19 NOTE — Progress Notes (Signed)
 PHARMACY - ANTICOAGULATION CONSULT NOTE  Pharmacy Consult for Heparin  (PTA apixaban  on hold) Indication: atrial fibrillation  Allergies  Allergen Reactions   Neomycin-Bacitracin Zn-Polymyx Rash   Sulfa Antibiotics Diarrhea and Nausea And Vomiting   Ciprofloxacin  Hives, Itching and Rash    Pt doesn't remember having any reaction to cipro    Spironolactone  Rash    Patient Measurements: Height: 5\' 6"  (167.6 cm) Weight: 115.3 kg (254 lb 3.1 oz) IBW/kg (Calculated) : 59.3 HEPARIN  DW (KG): 86.5  Vital Signs: Temp: 98.2 F (36.8 C) (06/04 0637) Temp Source: Oral (06/04 0637) BP: 118/80 (06/04 0637) Pulse Rate: 69 (06/04 0637)  Labs: Recent Labs    12/17/23 0433 12/17/23 1253 12/17/23 1253 12/17/23 2139 12/18/23 0513 12/19/23 0123  HGB 15.1*  --   --   --  13.8 14.0  HCT 48.4*  --   --   --  44.7 44.6  PLT 164  --   --   --  159 174  APTT  --  62*   < > 71* 68* 70*  LABPROT 18.8*  --   --   --   --   --   INR 1.6*  --   --   --   --   --   HEPARINUNFRC  --  >1.10*  --   --  >1.10* 0.95*  CREATININE 1.04*  --   --   --  1.66* 1.27*   < > = values in this interval not displayed.    Estimated Creatinine Clearance: 44.8 mL/min (A) (by C-G formula based on SCr of 1.27 mg/dL (H)).   Medical History: Past Medical History:  Diagnosis Date   Arthritis    "hands, wrists" (11/07/2016)   CHF (congestive heart failure) (HCC)    Chronic lower back pain    Constipation    DVT (deep venous thrombosis) (HCC)    History of blood transfusion 1990   "related to OR"   History of kidney stones    Hyperlipidemia    Hypertension    Joint pain    Lower extremity edema    OSA on CPAP    Persistent atrial fibrillation (HCC)    Pneumonia    "several times" (11/07/2016)   Type II diabetes mellitus (HCC)     Medications:  PTA apixaban  5mg  PO BID - LD 6/1 @ 09:00  Assessment: Pt is an 12 yoF admitted with cellulitis, concern for possible  olecranon bursitis. Pt chronically  anticoagulated with apixaban  for atrial fibrillation. Pharmacy consulted to dose heparin  while apixaban  on hold for possible invasive procedures.    Today, 12/19/23 aPTT = 70 seconds remains therapeutic on heparin  infusion of 1400 units/hr Heparin  level 0.95 (remains falsely elevated due to recent apixaban ) CBC: WNL, stable No bleeding reported  Goal of Therapy:  Heparin  level 0.3-0.7 units/ml aPTT 66-102 seconds Monitor platelets by anticoagulation protocol: Yes   Plan:  Continue heparin  infusion at 1400 units/hr CBC, heparin  level, aPTT daily Once heparin  level and aPTT correlate, can monitor using heparin  level only Monitor for signs of bleeding Per ortho note, surgery not necessary. Anticipate resumption of apixaban  soon.  Shireen Dory, PharmD 12/19/23 7:37 AM

## 2023-12-19 NOTE — Plan of Care (Signed)
  Problem: Clinical Measurements: Goal: Ability to maintain clinical measurements within normal limits will improve Outcome: Progressing Goal: Will remain free from infection Outcome: Progressing   Problem: Pain Managment: Goal: General experience of comfort will improve and/or be controlled Outcome: Progressing   Problem: Skin Integrity: Goal: Risk for impaired skin integrity will decrease Outcome: Progressing   Problem: Fluid Volume: Goal: Ability to maintain a balanced intake and output will improve Outcome: Progressing   Problem: Metabolic: Goal: Ability to maintain appropriate glucose levels will improve Outcome: Progressing   Problem: Skin Integrity: Goal: Risk for impaired skin integrity will decrease Outcome: Progressing

## 2023-12-19 NOTE — Progress Notes (Addendum)
 PROGRESS NOTE    Heidi Stark  NGE:[id number] DOB: [birth year] DOA: 12/16/2023 PCP: Estill Hemming, DO   Brief Narrative: Heidi Stark is a 83 y.o. female with a history of morbid obesity, diabetes mellitus type 2, hypertension, hyperlipidemia, obstructive sleep apnea on CPAP, consult heart failure, atrial fibrillation, ventricular tachycardia status post ICD placement, CKD, hypothyroidism.  Patient presented secondary to right elbow swelling and pain with evidence of right elbow cellulitis.  Empiric antibiotics started.  Orthopedic surgery was consulted secondary to right elbow swelling with recommendation for no surgical intervention.   Assessment and Plan:  Right elbow cellulitis Admission.  Unclear etiology.  Per patient, no known trauma history. Patient was managed outpatient with cephalexin , however does not appear to have been on it long enough to consider failed treatment. Patient started empirically on Vancomycin , Cefepime  and Flagyl , transitioned to Cefepime . Orthopedic surgery consulted and per their assessment, possibly related to patient pushing herself up in a recliner frequently. Overall, cellulitis improving. Cefepime  transitioned to Cefazolin  IV. -Continue Cefazolin  IV -Likely transition to Cefadroxil in AM  Olecranon bursitis Noted. Orthopedic surgery evaluated; recommendation for no aspiration or I&D.  Chronic HFpEF Noted. LVEF of 70-75% on Transthoracic Echocardiogram obtained this admission.  Paroxysmal atrial fibrillation/flutter Cardiology consulted. Rate controlled. -Cardiology recommendations: amiodarone , mexiletine, Toprol -XL, anticoagulation, possible outpatient DCCV  History of ventricular tachycardia S/p AICD. -Continue amiodarone  and mexiletine  Primary hypertension -Continue Entresto , Toprol  XL and eplerenone   PAD Noted history. On Eliquis  for atrial fibrillation. -Continue Eliquis   Hyperlipidemia -Continue Vascepa   Diabetes mellitus  type 2 Well controlled based on hemoglobin A1C of 6.5%.  AKI on CKD stage IIIa Baseline creatinine of about 1. Creatinine peaked to 1.66. Improved.  Hypothyroidism -Continue Synthroid  50 mcg daily  Anxiety Depression -Resume home Zoloft   OSA on CPAP -Continue CPAP at bedtime  Morbid obesity, class III Estimated body mass index is 41.03 kg/m as calculated from the following:   Height as of this encounter: 5\' 6"  (1.676 m).   Weight as of this encounter: 115.3 kg.   DVT prophylaxis: Eliquis  Code Status:   Code Status: Full Code Family Communication: None at bedside Disposition Plan: Discharge home likely in 24 hours once transitioned to oral antibiotics   Consultants:  Orthopedic surgery Cardiology  Procedures:  None  Antimicrobials: Vancomycin  IV Cefepime  IV Flagyl  IV Cefazolin  IV    Subjective: Patient reports her elbow feels much better. No other issues from overnight.  Objective: BP 124/76 (BP Location: Right Arm)   Pulse 73   Temp 98 F (36.7 C) (Oral)   Resp 16   Ht 5\' 6"  (1.676 m)   Wt 115.3 kg   SpO2 95%   BMI 41.03 kg/m   Examination:  General exam: Appears calm and comfortable Respiratory system: Clear to auscultation. Respiratory effort normal. Cardiovascular system: S1 & S2 heard, RRR. No murmurs, rubs, gallops or clicks. Gastrointestinal system: Abdomen is nondistended, soft and nontender. No organomegaly or masses felt. Normal bowel sounds heard. Central nervous system: Alert and oriented. No focal neurological deficits. Musculoskeletal: No edema. No calf tenderness. Right elbow with olecranon fluid collection without tenderness Skin: Right elbow erythema Psychiatry: Judgement and insight appear normal. Mood & affect appropriate.    Data Reviewed: I have personally reviewed following labs and imaging studies  CBC Lab Results  Component Value Date   WBC 13.7 (H) 12/19/2023   RBC 4.49 12/19/2023   HGB 14.0 12/19/2023   HCT 44.6  12/19/2023   MCV 99.3 12/19/2023  MCH 31.2 12/19/2023   PLT 174 12/19/2023   MCHC 31.4 12/19/2023   RDW 15.1 12/19/2023   LYMPHSABS 1.8 12/16/2023   MONOABS 1.8 (H) 12/16/2023   EOSABS 0.0 12/16/2023   BASOSABS 0.0 12/16/2023     Last metabolic panel Lab Results  Component Value Date   NA 133 (L) 12/19/2023   K 3.8 12/19/2023   CL 97 (L) 12/19/2023   CO2 25 12/19/2023   BUN 25 (H) 12/19/2023   CREATININE 1.27 (H) 12/19/2023   GLUCOSE 121 (H) 12/19/2023   GFRNONAA 42 (L) 12/19/2023   GFRAA 65 06/14/2020   CALCIUM  7.7 (L) 12/19/2023   PHOS 3.9 11/10/2023   PROT 6.9 12/16/2023   ALBUMIN 3.5 12/16/2023   LABGLOB 3.1 06/24/2021   AGRATIO 1.4 06/24/2021   BILITOT 1.2 12/16/2023   ALKPHOS 52 12/16/2023   AST 38 12/16/2023   ALT 16 12/16/2023   ANIONGAP 11 12/19/2023    GFR: Estimated Creatinine Clearance: 44.8 mL/min (A) (by C-G formula based on SCr of 1.27 mg/dL (H)).  Recent Results (from the past 240 hours)  Blood Culture (routine x 2)     Status: None (Preliminary result)   Collection Time: 12/16/23 11:51 PM   Specimen: BLOOD  Result Value Ref Range Status   Specimen Description   Final    BLOOD BLOOD LEFT ARM hand Performed at [facility], [address] [place], [place] [address]    Special Requests   Final    BOTTLES DRAWN AEROBIC AND ANAEROBIC Blood Culture results may not be optimal due to an inadequate volume of blood received in culture bottles Performed at [facility] [address], [place], [place] [address]    Culture   Final    NO GROWTH 1 DAY Performed at [facility], [address], [place], [place] [address]    Report Status PENDING  Incomplete  Blood Culture (routine x 2)     Status: None (Preliminary result)   Collection Time: 12/16/23 11:51 PM   Specimen: BLOOD  Result Value Ref Range Status   Specimen Description   Final    BLOOD BLOOD LEFT ARM Performed at [facility], [address], [place], [place] [address]    Special Requests   Final    BOTTLES DRAWN AEROBIC AND ANAEROBIC Blood Culture results may not be optimal due to an inadequate volume of blood received in culture bottles Performed at [facility], [address], [place], [place] [address]    Culture   Final    NO GROWTH 1 DAY Performed at [facility], [address], [place], [place] [address]    Report Status PENDING  Incomplete      Radiology Studies: ECHOCARDIOGRAM COMPLETE Result Date: 12/18/2023    ECHOCARDIOGRAM REPORT   Patient Name:   DANIQUE HARTSOUGH Date of Exam: 12/18/2023 Medical Rec #:  [id number]      Height:       66.0 in Accession #:    [id number]     Weight:       254.2 lb Date of Birth:  16-Jul-1941      BSA:          2.214 m Patient Age:    82 years       BP:           93/56 mmHg Patient Gender: F  HR:           101 bpm. Exam Location:  Inpatient Procedure: Intracardiac Opacification Agent, 2D Echo, Cardiac Doppler and Color            Doppler (Both Spectral and Color Flow Doppler were utilized during            procedure). Indications:    Abnormal ECG 794.31/ R94.31  History:        Patient has prior history of Echocardiogram examinations, most                 recent 02/22/2022. CHF, COPD; Risk Factors:Sleep Apnea, Former                 Smoker, Hypertension, Dyslipidemia and Diabetes.  Sonographer:    Kip Peon RDCS Referring Phys: [id number] [facility]  Sonographer Comments: Technically challenging study due to limited acoustic windows. Image acquisition challenging due to respiratory motion. IMPRESSIONS  1. Left ventricular ejection fraction, by estimation, is 70 to 75%. The left ventricle has hyperdynamic function. The left ventricle has no regional wall motion abnormalities. There is moderate concentric left ventricular hypertrophy. Left ventricular diastolic function could not be evaluated.  2. Right ventricular systolic function was  not well visualized. The right ventricular size is not well visualized. There is mildly elevated pulmonary artery systolic pressure. The estimated right ventricular systolic pressure is 37.1 mmHg.  3. Left atrial size was severely dilated.  4. The mitral valve is degenerative. No evidence of mitral valve regurgitation. No evidence of mitral stenosis. Moderate mitral annular calcification.  5. The aortic valve is tricuspid. There is mild calcification of the aortic valve. There is mild thickening of the aortic valve. Aortic valve regurgitation is not visualized. Aortic valve sclerosis/calcification is present, without any evidence of aortic stenosis.  6. There is borderline dilatation of the ascending aorta, measuring 38 mm.  7. The inferior vena cava is normal in size with greater than 50% respiratory variability, suggesting right atrial pressure of 3 mmHg. FINDINGS  Left Ventricle: Left ventricular ejection fraction, by estimation, is 70 to 75%. The left ventricle has hyperdynamic function. The left ventricle has no regional wall motion abnormalities. The left ventricular internal cavity size was normal in size. There is moderate concentric left ventricular hypertrophy. Left ventricular diastolic function could not be evaluated due to atrial fibrillation. Left ventricular diastolic function could not be evaluated. Right Ventricle: The right ventricular size is not well visualized. Right vetricular wall thickness was not well visualized. Right ventricular systolic function was not well visualized. There is mildly elevated pulmonary artery systolic pressure. The tricuspid regurgitant velocity is 2.35 m/s, and with an assumed right atrial pressure of 15 mmHg, the estimated right ventricular systolic pressure is 37.1 mmHg. Left Atrium: Left atrial size was severely dilated. Right Atrium: Right atrial size was not well visualized. Pericardium: There is no evidence of pericardial effusion. Mitral Valve: The mitral valve  is degenerative in appearance. Moderate mitral annular calcification. No evidence of mitral valve regurgitation. No evidence of mitral valve stenosis. Tricuspid Valve: The tricuspid valve is normal in structure. Tricuspid valve regurgitation is mild. Aortic Valve: The aortic valve is tricuspid. There is mild calcification of the aortic valve. There is mild thickening of the aortic valve. Aortic valve regurgitation is not visualized. Aortic valve sclerosis/calcification is present, without any evidence of aortic stenosis. Pulmonic Valve: The pulmonic valve was grossly normal. Pulmonic valve regurgitation is not visualized. Aorta: The aortic root is normal in size and  structure. There is borderline dilatation of the ascending aorta, measuring 38 mm. Venous: The inferior vena cava is normal in size with greater than 50% respiratory variability, suggesting right atrial pressure of 3 mmHg. IAS/Shunts: The interatrial septum was not well visualized. Additional Comments: A device lead is visualized in the right ventricle.  LEFT VENTRICLE PLAX 2D LVIDd:         5.40 cm   Diastology LVIDs:         3.60 cm   LV e' medial:    5.70 cm/s LV PW:         1.45 cm   LV E/e' medial:  21.2 LV IVS:        1.50 cm   LV e' lateral:   11.00 cm/s LVOT diam:     1.90 cm   LV E/e' lateral: 11.0 LV SV:         62 LV SV Index:   28 LVOT Area:     2.84 cm  RIGHT VENTRICLE            IVC RV S prime:     9.30 cm/s  IVC diam: 2.40 cm LEFT ATRIUM           Index LA diam:      5.30 cm 2.39 cm/m LA Vol (A2C): 60.1 ml 27.14 ml/m  AORTIC VALVE LVOT Vmax:   82.60 cm/s LVOT Vmean:  55.700 cm/s LVOT VTI:    0.218 m  AORTA Ao Root diam: 3.20 cm Ao Asc diam:  3.80 cm MITRAL VALVE                TRICUSPID VALVE MV Area (PHT): 3.60 cm     TR Peak grad:   22.1 mmHg MV Decel Time: 211 msec     TR Vmax:        235.00 cm/s MV E velocity: 121.00 cm/s MV A velocity: 40.50 cm/s   SHUNTS MV E/A ratio:  2.99         Systemic VTI:  0.22 m                              Systemic Diam: 1.90 cm Karyl Paget Croitoru MD Electronically signed by Luana Rumple MD Signature Date/Time: 12/18/2023/1:32:23 PM    Final       LOS: 1 day    Aneita Keens, MD [facility] 12/19/2023, 1:18 PM   If 7PM-7AM, please contact night-coverage [email or web address]

## 2023-12-20 ENCOUNTER — Inpatient Hospital Stay (HOSPITAL_COMMUNITY)

## 2023-12-20 DIAGNOSIS — E039 Hypothyroidism, unspecified: Secondary | ICD-10-CM | POA: Diagnosis not present

## 2023-12-20 DIAGNOSIS — N1831 Chronic kidney disease, stage 3a: Secondary | ICD-10-CM | POA: Diagnosis not present

## 2023-12-20 DIAGNOSIS — M25421 Effusion, right elbow: Secondary | ICD-10-CM | POA: Diagnosis not present

## 2023-12-20 DIAGNOSIS — L03119 Cellulitis of unspecified part of limb: Secondary | ICD-10-CM | POA: Diagnosis not present

## 2023-12-20 LAB — BASIC METABOLIC PANEL WITH GFR
Anion gap: 8 (ref 5–15)
BUN: 16 mg/dL (ref 8–23)
CO2: 25 mmol/L (ref 22–32)
Calcium: 8.2 mg/dL — ABNORMAL LOW (ref 8.9–10.3)
Chloride: 100 mmol/L (ref 98–111)
Creatinine, Ser: 0.8 mg/dL (ref 0.44–1.00)
GFR, Estimated: >60 mL/min
Glucose, Bld: 114 mg/dL — ABNORMAL HIGH (ref 70–99)
Potassium: 4.9 mmol/L (ref 3.5–5.1)
Sodium: 133 mmol/L — ABNORMAL LOW (ref 135–145)

## 2023-12-20 LAB — GLUCOSE, CAPILLARY
Glucose-Capillary: 116 mg/dL — ABNORMAL HIGH (ref 70–99)
Glucose-Capillary: 111 mg/dL — ABNORMAL HIGH (ref 70–99)
Glucose-Capillary: 107 mg/dL — ABNORMAL HIGH (ref 70–99)
Glucose-Capillary: 99 mg/dL (ref 70–99)

## 2023-12-20 LAB — CBC
HCT: 46.4 % — ABNORMAL HIGH (ref 36.0–46.0)
Hemoglobin: 14.2 g/dL (ref 12.0–15.0)
MCH: 30.6 pg (ref 26.0–34.0)
MCHC: 30.6 g/dL (ref 30.0–36.0)
MCV: 100 fL (ref 80.0–100.0)
Platelets: 203 10*3/uL (ref 150–400)
RBC: 4.64 MIL/uL (ref 3.87–5.11)
RDW: 15.2 % (ref 11.5–15.5)
WBC: 10.9 10*3/uL — ABNORMAL HIGH (ref 4.0–10.5)
nRBC: 0 % (ref 0.0–0.2)

## 2023-12-20 MED ORDER — DOXYCYCLINE HYCLATE 100 MG PO TABS
100.0000 mg | ORAL_TABLET | Freq: Two times a day (BID) | ORAL | Status: DC
  Administered 2023-12-20 – 2023-12-27 (×14): 100 mg via ORAL
  Filled 2023-12-20 (×14): qty 1

## 2023-12-20 MED ORDER — GABAPENTIN 300 MG PO CAPS
300.0000 mg | ORAL_CAPSULE | Freq: Two times a day (BID) | ORAL | Status: DC | PRN
  Administered 2023-12-20 – 2023-12-26 (×8): 300 mg via ORAL
  Filled 2023-12-20 (×10): qty 1

## 2023-12-20 MED ORDER — GUAIFENESIN-DM 100-10 MG/5ML PO SYRP: 5.0000 mL | ORAL_SOLUTION | ORAL | Status: DC | PRN

## 2023-12-20 MED ORDER — POLYETHYLENE GLYCOL 3350 17 G PO PACK
17.0000 g | PACK | Freq: Two times a day (BID) | ORAL | Status: DC
  Administered 2023-12-20 – 2023-12-26 (×8): 17 g via ORAL
  Filled 2023-12-20 (×11): qty 1

## 2023-12-20 NOTE — Progress Notes (Signed)
 PROGRESS NOTE    Heidi Stark  NWG:[id number] DOB: July 08, 1941 DOA: 12/16/2023 PCP: Estill Hemming, DO   Brief Narrative: Heidi Stark is a 82 y.o. female with a history of morbid obesity, diabetes mellitus type 2, hypertension, hyperlipidemia, obstructive sleep apnea on CPAP, consult heart failure, atrial fibrillation, ventricular tachycardia status post ICD placement, CKD, hypothyroidism.  Patient presented secondary to right elbow swelling and pain with evidence of right elbow cellulitis.  Empiric antibiotics started.  Orthopedic surgery was consulted secondary to right elbow swelling with recommendation for no surgical intervention.   Assessment and Plan:  Right elbow cellulitis Admission.  Unclear etiology.  Per patient, no known trauma history. Patient was managed outpatient with cephalexin , however does not appear to have been on it long enough to consider failed treatment. Patient started empirically on Vancomycin , Cefepime  and Flagyl , transitioned to Cefepime . Orthopedic surgery consulted and per their assessment, possibly related to patient pushing herself up in a recliner frequently. Overall, cellulitis improving. Cefepime  transitioned to Cefazolin  IV. -Continue Cefazolin  IV -Likely transition to Cefadroxil in AM (hold today secondary to patient's concern of worsening pain) -CT of right elbow  Olecranon bursitis Noted. Orthopedic surgery evaluated; recommendation for no aspiration or I&D.  Chronic HFpEF Noted. LVEF of 70-75% on Transthoracic Echocardiogram obtained this admission.  Paroxysmal atrial fibrillation/flutter Cardiology consulted. Rate controlled. -Cardiology recommendations: amiodarone , mexiletine, Toprol -XL, anticoagulation, possible outpatient DCCV  History of ventricular tachycardia S/p AICD. -Continue amiodarone  and mexiletine  Primary hypertension -Continue Entresto , Toprol  XL and eplerenone   PAD Noted history. On Eliquis  for atrial  fibrillation. -Continue Eliquis   Hyperlipidemia -Continue Vascepa   Diabetes mellitus type 2 Well controlled based on hemoglobin A1C of 6.5%.  AKI on CKD stage IIIa Baseline creatinine of about 1. Creatinine peaked to 1.66. Improved.  Hypothyroidism -Continue Synthroid  50 mcg daily  Anxiety Depression -Resume home Zoloft   Cough Some mild yellow sputum production. Afebrile. No dyspnea. Chest x-ray obtained and shows no evidence of cardiopulmonary disease.  OSA on CPAP -Continue CPAP at bedtime  Morbid obesity, class III Estimated body mass index is 41.03 kg/m as calculated from the following:   Height as of this encounter: 5\' 6"  (1.676 m).   Weight as of this encounter: 115.3 kg.   DVT prophylaxis: Eliquis  Code Status:   Code Status: Full Code Family Communication: None at bedside Disposition Plan: Discharge home likely in 24 hours once transitioned to oral antibiotics and pending imaging results   Consultants:  Orthopedic surgery Cardiology  Procedures:  None  Antimicrobials: Vancomycin  IV Cefepime  IV Flagyl  IV Cefazolin  IV    Subjective: Patient reports worsening right elbow pain since yesterday afternoon. Afebrile.  Objective: BP (!) 129/91 (BP Location: Right Wrist)   Pulse (!) 56   Temp 97.8 F (36.6 C) (Oral)   Resp 17   Ht 5\' 6"  (1.676 m)   Wt 115.3 kg   SpO2 95%   BMI 41.03 kg/m   Examination:  General exam: Appears calm and comfortable Respiratory system: Clear to auscultation. Respiratory effort normal. Cardiovascular system: S1 & S2 heard, RRR. No murmurs. Gastrointestinal system: Abdomen is nondistended, soft and nontender. Normal bowel sounds heard. Central nervous system: Alert and oriented. No focal neurological deficits. Musculoskeletal: No edema. No calf tenderness. Swelling over right elbow noted without tenderness Skin: No cyanosis. Erythema over right elbow. Psychiatry: Judgement and insight appear normal. Mood & affect  appropriate.    Data Reviewed: I have personally reviewed following labs and imaging studies  CBC Lab Results  Component Value Date   WBC 10.9 (H) 12/20/2023   RBC 4.64 12/20/2023   HGB 14.2 12/20/2023   HCT 46.4 (H) 12/20/2023   MCV 100.0 12/20/2023   MCH 30.6 12/20/2023   PLT 203 12/20/2023   MCHC 30.6 12/20/2023   RDW 15.2 12/20/2023   LYMPHSABS 1.8 12/16/2023   MONOABS 1.8 (H) 12/16/2023   EOSABS 0.0 12/16/2023   BASOSABS 0.0 12/16/2023     Last metabolic panel Lab Results  Component Value Date   NA 133 (L) 12/20/2023   K 4.9 12/20/2023   CL 100 12/20/2023   CO2 25 12/20/2023   BUN 16 12/20/2023   CREATININE 0.80 12/20/2023   GLUCOSE 114 (H) 12/20/2023   GFRNONAA >60 12/20/2023   GFRAA 65 06/14/2020   CALCIUM  8.2 (L) 12/20/2023   PHOS 3.9 11/10/2023   PROT 6.9 12/16/2023   ALBUMIN 3.5 12/16/2023   LABGLOB 3.1 06/24/2021   AGRATIO 1.4 06/24/2021   BILITOT 1.2 12/16/2023   ALKPHOS 52 12/16/2023   AST 38 12/16/2023   ALT 16 12/16/2023   ANIONGAP 8 12/20/2023    GFR: Estimated Creatinine Clearance: 71.1 mL/min (by C-G formula based on SCr of 0.8 mg/dL).  Recent Results (from the past 240 hours)  Blood Culture (routine x 2)     Status: None (Preliminary result)   Collection Time: 12/16/23 11:51 PM   Specimen: BLOOD  Result Value Ref Range Status   Specimen Description   Final    BLOOD BLOOD LEFT ARM hand Performed at [facility], [address] [place], [place] [address]    Special Requests   Final    BOTTLES DRAWN AEROBIC AND ANAEROBIC Blood Culture results may not be optimal due to an inadequate volume of blood received in culture bottles Performed at [facility], [address], [place], [place] [address]    Culture   Final    NO GROWTH 3 DAYS Performed at [facility], [address], [place], [place] [address]    Report Status PENDING  Incomplete  Blood Culture (routine x 2)     Status: None  (Preliminary result)   Collection Time: 12/16/23 11:51 PM   Specimen: BLOOD  Result Value Ref Range Status   Specimen Description   Final    BLOOD BLOOD LEFT ARM Performed at [facility], [address], [place], [place] [address]    Special Requests   Final    BOTTLES DRAWN AEROBIC AND ANAEROBIC Blood Culture results may not be optimal due to an inadequate volume of blood received in culture bottles Performed at [facility], [address], [place], [place] [address]    Culture   Final    NO GROWTH 3 DAYS Performed at [facility], [address], [place], [place] [address]    Report Status PENDING  Incomplete      Radiology Studies: DG CHEST PORT 1 VIEW Result Date: 12/20/2023 CLINICAL DATA:  Productive cough. EXAM: PORTABLE CHEST 1 VIEW COMPARISON:  December 17, 2023. FINDINGS: Stable cardiomediastinal silhouette. Left-sided defibrillator is unchanged. Both lungs are clear. The visualized skeletal structures are unremarkable. IMPRESSION: No active disease. Electronically Signed   By: Rosalene Colon M.D.   On: 12/20/2023 11:33      LOS: 2 days    Aneita Keens, MD [facility] 12/20/2023, 1:35 PM   If 7PM-7AM, please contact night-coverage [email or web address]

## 2023-12-20 NOTE — Progress Notes (Signed)
   12/20/23 2333  BiPAP/CPAP/SIPAP  BiPAP/CPAP/SIPAP Pt Type Adult  Reason BIPAP/CPAP not in use Other(comment) (pt refused)

## 2023-12-21 ENCOUNTER — Ambulatory Visit: Payer: Medicare Other

## 2023-12-21 DIAGNOSIS — M25421 Effusion, right elbow: Secondary | ICD-10-CM | POA: Diagnosis not present

## 2023-12-21 DIAGNOSIS — L03119 Cellulitis of unspecified part of limb: Secondary | ICD-10-CM | POA: Diagnosis not present

## 2023-12-21 DIAGNOSIS — E039 Hypothyroidism, unspecified: Secondary | ICD-10-CM | POA: Diagnosis not present

## 2023-12-21 DIAGNOSIS — N1831 Chronic kidney disease, stage 3a: Secondary | ICD-10-CM | POA: Diagnosis not present

## 2023-12-21 LAB — GLUCOSE, CAPILLARY
Glucose-Capillary: 81 mg/dL (ref 70–99)
Glucose-Capillary: 211 mg/dL — ABNORMAL HIGH (ref 70–99)
Glucose-Capillary: 96 mg/dL (ref 70–99)
Glucose-Capillary: 122 mg/dL — ABNORMAL HIGH (ref 70–99)
Glucose-Capillary: 111 mg/dL — ABNORMAL HIGH (ref 70–99)

## 2023-12-21 NOTE — Progress Notes (Signed)
 PROGRESS NOTE    Heidi Stark  WUJ:[id number] DOB: 11-26-1940 DOA: 12/16/2023 PCP: Estill Hemming, DO   Brief Narrative: Heidi Stark is a 83 y.o. female with a history of morbid obesity, diabetes mellitus type 2, hypertension, hyperlipidemia, obstructive sleep apnea on CPAP, consult heart failure, atrial fibrillation, ventricular tachycardia status post ICD placement, CKD, hypothyroidism.  Patient presented secondary to right elbow swelling and pain with evidence of right elbow cellulitis.  Empiric antibiotics started.  Orthopedic surgery was consulted secondary to right elbow swelling with recommendation for no surgical intervention.   Assessment and Plan:  Right elbow cellulitis Present on admission.  Patient has a history of direct injury, with wound present over right elbow. Patient was managed outpatient with cephalexin , however does not appear to have been on it long enough to consider failed treatment. Patient started empirically on Vancomycin , Cefepime  and Flagyl , transitioned to Cefepime . Orthopedic surgery consulted and per their assessment, possibly related to patient pushing herself up in a recliner frequently. Overall, cellulitis improving. Cefepime  transitioned to Cefazolin  IV. Doxycycline  added. CT of right elbow is reassuring. -Continue Cefazolin  IV and doxycycline  -Will ask orthopedic surgery to reevaluate  Olecranon bursitis Noted. Orthopedic surgery evaluated; recommendation for no aspiration or I&D.  Chronic HFpEF Noted. LVEF of 70-75% on Transthoracic Echocardiogram obtained this admission.  Paroxysmal atrial fibrillation/flutter Cardiology consulted. Rate controlled. -Cardiology recommendations: amiodarone , mexiletine, Toprol -XL, anticoagulation, possible outpatient DCCV  History of ventricular tachycardia S/p AICD. -Continue amiodarone  and mexiletine  Primary hypertension -Continue Entresto , Toprol  XL and eplerenone   PAD Noted history. On  Eliquis  for atrial fibrillation. -Continue Eliquis   Hyperlipidemia -Continue Vascepa   Diabetes mellitus type 2 Well controlled based on hemoglobin A1C of 6.5%.  AKI on CKD stage IIIa Baseline creatinine of about 1. Creatinine peaked to 1.66. Improved.  Hypothyroidism -Continue Synthroid  50 mcg daily  Anxiety Depression -Resume home Zoloft   Cough Some mild yellow sputum production. Afebrile. No dyspnea. Chest x-ray obtained and shows no evidence of cardiopulmonary disease.  OSA on CPAP -Continue CPAP at bedtime  Morbid obesity, class III Estimated body mass index is 41.03 kg/m as calculated from the following:   Height as of this encounter: 5\' 6"  (1.676 m).   Weight as of this encounter: 115.3 kg.   DVT prophylaxis: Eliquis  Code Status:   Code Status: Full Code Family Communication: Daughter at bedside Disposition Plan: Discharge home pending improvement in erythema in addition to orthopedic surgery recommendations   Consultants:  Orthopedic surgery Cardiology  Procedures:  None  Antimicrobials: Vancomycin  IV Cefepime  IV Flagyl  IV Cefazolin  IV    Subjective: Continued right elbow pain. She feels that the pain is not getting better and the elbow is getting warmer to touch.  Objective: BP (!) 126/90 (BP Location: Right Wrist)   Pulse 72   Temp 98.5 F (36.9 C) (Oral)   Resp 19   Ht 5\' 6"  (1.676 m)   Wt 115.3 kg   SpO2 97%   BMI 41.03 kg/m   Examination:  General exam: Appears calm and comfortable Respiratory system: Clear to auscultation. Respiratory effort normal. Cardiovascular system: S1 & S2 heard, RRR. No murmurs, rubs, gallops or clicks. Gastrointestinal system: Abdomen is nondistended, soft and nontender. Normal bowel sounds heard. Central nervous system: Alert and oriented. No focal neurological deficits. Musculoskeletal: No edema. No calf tenderness. Right elbow with fluid collection overlying Skin: No cyanosis. Erythema over right  elbow with warmth and tenderness. No drainage noted. Psychiatry: Judgement and insight appear normal. Mood &  affect appropriate.    Data Reviewed: I have personally reviewed following labs and imaging studies  CBC Lab Results  Component Value Date   WBC 10.9 (H) 12/20/2023   RBC 4.64 12/20/2023   HGB 14.2 12/20/2023   HCT 46.4 (H) 12/20/2023   MCV 100.0 12/20/2023   MCH 30.6 12/20/2023   PLT 203 12/20/2023   MCHC 30.6 12/20/2023   RDW 15.2 12/20/2023   LYMPHSABS 1.8 12/16/2023   MONOABS 1.8 (H) 12/16/2023   EOSABS 0.0 12/16/2023   BASOSABS 0.0 12/16/2023     Last metabolic panel Lab Results  Component Value Date   NA 133 (L) 12/20/2023   K 4.9 12/20/2023   CL 100 12/20/2023   CO2 25 12/20/2023   BUN 16 12/20/2023   CREATININE 0.80 12/20/2023   GLUCOSE 114 (H) 12/20/2023   GFRNONAA >60 12/20/2023   GFRAA 65 06/14/2020   CALCIUM  8.2 (L) 12/20/2023   PHOS 3.9 11/10/2023   PROT 6.9 12/16/2023   ALBUMIN 3.5 12/16/2023   LABGLOB 3.1 06/24/2021   AGRATIO 1.4 06/24/2021   BILITOT 1.2 12/16/2023   ALKPHOS 52 12/16/2023   AST 38 12/16/2023   ALT 16 12/16/2023   ANIONGAP 8 12/20/2023    GFR: Estimated Creatinine Clearance: 71.1 mL/min (by C-G formula based on SCr of 0.8 mg/dL).  Recent Results (from the past 240 hours)  Blood Culture (routine x 2)     Status: None (Preliminary result)   Collection Time: 12/16/23 11:51 PM   Specimen: BLOOD  Result Value Ref Range Status   Specimen Description   Final    BLOOD BLOOD LEFT ARM hand Performed at [facility], [address] [place], [place] [address]    Special Requests   Final    BOTTLES DRAWN AEROBIC AND ANAEROBIC Blood Culture results may not be optimal due to an inadequate volume of blood received in culture bottles Performed at [facility], [address] [place], [place] [address]    Culture   Final    NO GROWTH 4 DAYS Performed at [facility], [address], [place], [place] [address]    Report Status PENDING  Incomplete  Blood Culture (routine x 2)     Status: None (Preliminary result)   Collection Time: 12/16/23 11:51 PM   Specimen: BLOOD  Result Value Ref Range Status   Specimen Description   Final    BLOOD BLOOD LEFT ARM Performed at [facility], [address] [place], [place] [address]    Special Requests   Final    BOTTLES DRAWN AEROBIC AND ANAEROBIC Blood Culture results may not be optimal due to an inadequate volume of blood received in culture bottles Performed at [facility], [address], [place], [place] [address]    Culture   Final    NO GROWTH 4 DAYS Performed at [facility], [address], [place], [place] [address]    Report Status PENDING  Incomplete      Radiology Studies: CT ELBOW RIGHT WO CONTRAST Result Date: 12/20/2023 CLINICAL DATA:  Olecranon bursitis, cellulitis. Worsening symptoms EXAM: CT OF THE UPPER RIGHT EXTREMITY WITHOUT CONTRAST TECHNIQUE: Multidetector CT imaging of the upper right extremity was performed according to the standard protocol. RADIATION DOSE REDUCTION: This exam was performed according to the departmental dose-optimization program which includes automated exposure control, adjustment of the mA and/or kV according to patient size and/or use of iterative reconstruction technique. COMPARISON:  None Available. FINDINGS: Bones/Joint/Cartilage No fracture or dislocation. No evidence of osteolysis. Tiny well corticated ossicle adjacent to the lateral humeral epicondyle may relate to prior trauma. Radiocapitellar and ulnotrochlear joints are anatomically aligned. Ligaments Ligaments are suboptimally evaluated by CT. Muscles and Tendons Muscles are normal. No muscle atrophy. No intramuscular fluid collection or hematoma. Soft tissue Fluid density within the subcutaneous tissues overlying the olecranon at the level of the olecranon bursa, measures  approximately 5.1 x 2.8 x 1.4 cm, however, the borders of the olecranon bursa are difficult to delineate given surrounding subcutaneous edema and overlying cutaneous thickening extending along the dorsal elbow and visualized arm. No soft tissue gas. IMPRESSION: 1. Findings most compatible with olecranon bursitis with surrounding cellulitis extending along the dorsal elbow and visualized arm. 2. No acute osseous abnormality. Electronically Signed   By: Mannie Seek M.D.   On: 12/20/2023 14:11   DG CHEST PORT 1 VIEW Result Date: 12/20/2023 CLINICAL DATA:  Productive cough. EXAM: PORTABLE CHEST 1 VIEW COMPARISON:  December 17, 2023. FINDINGS: Stable cardiomediastinal silhouette. Left-sided defibrillator is unchanged. Both lungs are clear. The visualized skeletal structures are unremarkable. IMPRESSION: No active disease. Electronically Signed   By: Rosalene Colon M.D.   On: 12/20/2023 11:33      LOS: 3 days    Aneita Keens, MD [facility] 12/21/2023, 12:37 PM   If 7PM-7AM, please contact night-coverage [email or web address]

## 2023-12-21 NOTE — Progress Notes (Signed)
   12/21/23 2317  BiPAP/CPAP/SIPAP  Reason BIPAP/CPAP not in use Non-compliant  BiPAP/CPAP /SiPAP Vitals  Resp (!) 22  MEWS Score/Color  MEWS Score 1  MEWS Score Color Marrie Sizer

## 2023-12-21 NOTE — Progress Notes (Signed)
 Physical Therapy Treatment Patient Details Name: Heidi Stark MRN: [id number] DOB: 1941-07-13 Today's Date: 12/21/2023   History of Present Illness Heidi Stark is a 83 y.o. female presents to the hospital with Rt elbow swelling and pain. Admitted for Rt elbow cellulitis to be treated with IV antibiotics. PMH significant of with past medical history of HTN, DM 2, HLD, P A-fib on Eliquis , splenic and renal infarcts, congestive heart failure with recovered ejection fraction 65%, hypothyroidism, history of VT.    PT Comments  AxO x 3 plerasant Lady familiar from prior April Admit who lives home with her daughter and Son in [place].  Pt admits to sleeping/living in her LIFT CHAIR but was able to self transfer and amb to the bathroom and back approx 20 feet with her walker on her own. Assisted OOB to amb to bathroom went well.  General transfer comment: Pt was able to self rise from elevated bed at Contact Guard Assist but required + 2 assist to rise from lower toilet height.  Pt has an elevated toilet at home in which she is self able.General Gait Details: Pt was self able to amb 8 feet to bathroom at Supervision assist with a walker then amb another 8 feet back to bed. Pt appears close to baseline.  She uses a wheelchair to get from car into house.  Very supportive Daughter in room. Pt plans to return to her Daughters.  LPT has rec HH PT.    If plan is discharge home, recommend the following: A little help with walking and/or transfers;A little help with bathing/dressing/bathroom;Assistance with cooking/housework;Assist for transportation;Help with stairs or ramp for entrance   Can travel by private vehicle     Yes  Equipment Recommendations  None recommended by PT    Recommendations for Other Services       Precautions / Restrictions Precautions Precautions: Fall Precaution/Restrictions Comments: LIFT CHAIR Restrictions Weight Bearing Restrictions Per Provider Order: No     Mobility   Bed Mobility Overal bed mobility: (P) Needs Assistance Bed Mobility: (P) Supine to Sit, Sit to Supine     Supine to sit: (P) Min assist Sit to supine: (P) Mod assist   General bed mobility comments: (P) required Min Assist with upper body to transition to EOB and Mod Assist to support B LE back into bed.    Transfers Overall transfer level: (P) Needs assistance Equipment used: (P) Rolling walker (2 wheels) Transfers: (P) Sit to/from Stand Sit to Stand: (P) Contact guard assist, Min assist, Mod assist           General transfer comment: (P) Pt was able to self rise from elevated bed at Contact Guard Assist but required + 2 assist to rise from lower toilet height.  Pt has an elevated toilet at home in which she is self able.    Ambulation/Gait Ambulation/Gait assistance: (P) Supervision, Contact guard assist Gait Distance (Feet): (P) 16 Feet (8 feet x 2) Assistive device: (P) Rolling walker (2 wheels) Gait Pattern/deviations: (P) Decreased step length - right, Decreased step length - left, Decreased stride length Gait velocity: (P) decreased     General Gait Details: (P) Pt was self able to amb 8 feet to bathroom at Supervision assist with a walker then amb another 8 feet back to bed.   Stairs             Wheelchair Mobility     Tilt Bed    Modified Rankin (Stroke Patients Only)  Balance                                            Communication Communication Communication: No apparent difficulties  Cognition   Behavior During Therapy: WFL for tasks assessed/performed                           PT - Cognition Comments: AxO x 3 plerasant Lady familiar from prior April Admit who lives home with her daughter and Son in [place].  Pt admits to sleeping/living in her LIFT CHAIR but was able to self transfer and amb to the bathroom and back approx 20 feet with her walker on her own. Following commands: Intact      Cueing Cueing  Techniques: Verbal cues  Exercises      General Comments        Pertinent Vitals/Pain Pain Assessment Pain Assessment: Faces Faces Pain Scale: Hurts even more Pain Location: R elbow, chronic back pain Pain Descriptors / Indicators: Discomfort, Burning Pain Intervention(s): Monitored during session    Home Living                          Prior Function            PT Goals (current goals can now be found in the care plan section) Progress towards PT goals: Progressing toward goals    Frequency    Min 2X/week      PT Plan      Co-evaluation              AM-PAC PT "6 Clicks" Mobility   Outcome Measure  Help needed turning from your back to your side while in a flat bed without using bedrails?: A Little Help needed moving from lying on your back to sitting on the side of a flat bed without using bedrails?: A Little Help needed moving to and from a bed to a chair (including a wheelchair)?: A Little Help needed standing up from a chair using your arms (e.g., wheelchair or bedside chair)?: A Little Help needed to walk in hospital room?: A Little Help needed climbing 3-5 steps with a railing? : A Lot 6 Click Score: 17    End of Session Equipment Utilized During Treatment: Gait belt Activity Tolerance: Patient tolerated treatment well Patient left: in bed;with bed alarm set;with call bell/phone within reach;with family/visitor present Nurse Communication: Mobility status PT Visit Diagnosis: Other abnormalities of gait and mobility (R26.89);Muscle weakness (generalized) (M62.81);Difficulty in walking, not elsewhere classified (R26.2) Pain - Right/Left: Right Pain - part of body: Arm     Time: 1400-1426 PT Time Calculation (min) (ACUTE ONLY): 26 min  Charges:    $Gait Training: 8-22 mins $Therapeutic Activity: 8-22 mins PT General Charges $$ ACUTE PT VISIT: 1 Visit                     Heidi Stark  PTA Acute  Rehabilitation Services Office  M-F          [phone]

## 2023-12-22 ENCOUNTER — Other Ambulatory Visit: Payer: Self-pay | Admitting: Cardiology

## 2023-12-22 DIAGNOSIS — M25421 Effusion, right elbow: Secondary | ICD-10-CM | POA: Diagnosis not present

## 2023-12-22 DIAGNOSIS — L03119 Cellulitis of unspecified part of limb: Secondary | ICD-10-CM | POA: Diagnosis not present

## 2023-12-22 DIAGNOSIS — E039 Hypothyroidism, unspecified: Secondary | ICD-10-CM | POA: Diagnosis not present

## 2023-12-22 DIAGNOSIS — E876 Hypokalemia: Secondary | ICD-10-CM

## 2023-12-22 DIAGNOSIS — N1832 Chronic kidney disease, stage 3b: Secondary | ICD-10-CM

## 2023-12-22 DIAGNOSIS — I480 Paroxysmal atrial fibrillation: Secondary | ICD-10-CM | POA: Diagnosis not present

## 2023-12-22 DIAGNOSIS — N1831 Chronic kidney disease, stage 3a: Secondary | ICD-10-CM | POA: Diagnosis not present

## 2023-12-22 DIAGNOSIS — E785 Hyperlipidemia, unspecified: Secondary | ICD-10-CM | POA: Diagnosis not present

## 2023-12-22 DIAGNOSIS — I5022 Chronic systolic (congestive) heart failure: Secondary | ICD-10-CM | POA: Diagnosis not present

## 2023-12-22 DIAGNOSIS — I10 Essential (primary) hypertension: Secondary | ICD-10-CM | POA: Diagnosis not present

## 2023-12-22 LAB — CULTURE, BLOOD (ROUTINE X 2)
Culture: NO GROWTH
Culture: NO GROWTH

## 2023-12-22 LAB — GLUCOSE, CAPILLARY
Glucose-Capillary: 78 mg/dL (ref 70–99)
Glucose-Capillary: 113 mg/dL — ABNORMAL HIGH (ref 70–99)
Glucose-Capillary: 115 mg/dL — ABNORMAL HIGH (ref 70–99)
Glucose-Capillary: 104 mg/dL — ABNORMAL HIGH (ref 70–99)

## 2023-12-22 NOTE — Progress Notes (Signed)
.  Subjective:   Patient states right elbow is improving. States pain and redness seem better  Objective: Vital signs in last 24 hours: Temp:  [97.5 F (36.4 C)-98.8 F (37.1 C)] 97.7 F (36.5 C) (06/07 1008) Pulse Rate:  [44-58] 58 (06/07 1008) Resp:  [15-22] 20 (06/07 1008) BP: (111-127)/(55-77) 111/77 (06/07 1008) SpO2:  [97 %-99 %] 97 % (06/07 1008)  Labs: Recent Labs    12/20/23 1033  HGB 14.2   Recent Labs    12/20/23 1033  WBC 10.9*  RBC 4.64  HCT 46.4*  PLT 203   Recent Labs    12/20/23 1033  NA 133*  K 4.9  CL 100  CO2 25  BUN 16  CREATININE 0.80  GLUCOSE 114*  CALCIUM  8.2*   No results for input(s): "LABPT", "INR" in the last 72 hours.  Physical Exam:  Neurologically intact Sensation intact distally Good ROM elbow Mild redness, no fluctuance or collection appreciated Good pulses distally     Assessment Right elbow cellulitis/olecranon bursitis     PLAN:   Continue with IV antibiotics.   Patient states she feels like it is improving. I don't feel requires aspiration or I&D at this time. Pain control as needed ROM of elbow encouraged Ice/elevation/compression DVT ppx per primary, no orthopedic contraindications   Heidi Stark 12/22/2023, 1:04 PM

## 2023-12-22 NOTE — Progress Notes (Addendum)
 Progress Note  Patient Name: Heidi Stark Date of Encounter: 12/22/2023  Primary Cardiologist: Peder Bourdon, MD  Interval Summary  Chart reviewed since cardiology consultation yesterday.  Recent systolics 110-130 range and heart rates in the 60s to 80s.  Patient up in bedside chair today.  Less discomfort in her right elbow.  No shortness of breath at rest.  Vital Signs  Vitals:   12/21/23 2317 12/22/23 0004 12/22/23 0535 12/22/23 1008  BP:   127/72 111/77  Pulse:   (!) 44 (!) 58  Resp: (!) 22 15 20 20   Temp:   (!) 97.5 F (36.4 C) 97.7 F (36.5 C)  TempSrc:   Oral Oral  SpO2:   99% 97%  Weight:      Height:        Intake/Output Summary (Last 24 hours) at 12/22/2023 1012 Last data filed at 12/22/2023 0900 Gross per 24 hour  Intake 1050 ml  Output --  Net 1050 ml   Filed Weights   12/17/23 0505  Weight: 115.3 kg    Physical Exam  GEN: No acute distress.   Neck: No JVD. Cardiac: RRR, no murmur, rub, or gallop.  Respiratory: Nonlabored. Clear to auscultation bilaterally. GI: Soft, nontender, bowel sounds present. MS: No edema.  ECG/Telemetry  Telemetry shows rate controlled atrial flutter with variable block.  Labs  Chemistry Recent Labs  Lab 12/16/23 2351 12/17/23 0433 12/18/23 0513 12/19/23 0123 12/20/23 1033  NA 133* 134*  --  133* 133*  K 3.7 3.5  --  3.8 4.9  CL 97* 98  --  97* 100  CO2 24 24  --  25 25  GLUCOSE 151* 137*  --  121* 114*  BUN 18 19  --  25* 16  CREATININE 0.99 1.04* 1.66* 1.27* 0.80  CALCIUM  7.9* 8.0*  --  7.7* 8.2*  PROT 6.9  --   --   --   --   ALBUMIN 3.5  --   --   --   --   AST 38  --   --   --   --   ALT 16  --   --   --   --   ALKPHOS 52  --   --   --   --   BILITOT 1.2  --   --   --   --   GFRNONAA 57* 54* 31* 42* >60  ANIONGAP 12 12  --  11 8    Hematology Recent Labs  Lab 12/18/23 0513 12/19/23 0123 12/20/23 1033  WBC 14.5* 13.7* 10.9*  RBC 4.44 4.49 4.64  HGB 13.8 14.0 14.2  HCT 44.7 44.6 46.4*   MCV 100.7* 99.3 100.0  MCH 31.1 31.2 30.6  MCHC 30.9 31.4 30.6  RDW 15.0 15.1 15.2  PLT 159 174 203   Lipid Panel     Component Value Date/Time   CHOL 152 09/14/2023 1528   CHOL 170 06/02/2019 0913   TRIG 307 (H) 09/14/2023 1528   HDL 32 (L) 09/14/2023 1528   HDL 31 (L) 06/02/2019 0913   CHOLHDL 4.8 09/14/2023 1528   VLDL 38 04/19/2023 1528   LDLCALC 83 09/14/2023 1528   LDLDIRECT 42.0 11/30/2014 1226   LABVLDL 47 (H) 06/02/2019 0913   Cardiac Studies  Echocardiogram 12/18/2023:  1. Left ventricular ejection fraction, by estimation, is 70 to 75%. The  left ventricle has hyperdynamic function. The left ventricle has no  regional wall motion abnormalities. There is moderate concentric left  ventricular hypertrophy. Left ventricular  diastolic function could not be evaluated.   2. Right ventricular systolic function was not well visualized. The right  ventricular size is not well visualized. There is mildly elevated  pulmonary artery systolic pressure. The estimated right ventricular  systolic pressure is 37.1 mmHg.   3. Left atrial size was severely dilated.   4. The mitral valve is degenerative. No evidence of mitral valve  regurgitation. No evidence of mitral stenosis. Moderate mitral annular  calcification.   5. The aortic valve is tricuspid. There is mild calcification of the  aortic valve. There is mild thickening of the aortic valve. Aortic valve  regurgitation is not visualized. Aortic valve sclerosis/calcification is  present, without any evidence of  aortic stenosis.   6. There is borderline dilatation of the ascending aorta, measuring 38  mm.   7. The inferior vena cava is normal in size with greater than 50%  respiratory variability, suggesting right atrial pressure of 3 mmHg.   Assessment & Plan  1.  HFrecEF, LVEF 70 to 75% by echocardiogram during this admission.  She has been on stable GDMT as an outpatient, held initially in the setting of hypotension but  hemodynamics improving.  2.  Paroxysmal to persistent atrial fibrillation/flutter, CHA2DS2-VASc score is 7.  Heart rate controlled and she is asymptomatic at this time.  3.  History of recurrent VT.  [employer] ICD in place and continues on amiodarone  and mexiletine.  No recent recurrence.  4.  History of primary hypertension.  Blood pressure stabilizing.  5.  Mixed hyperlipidemia.  Currently on Vascepa  2 g twice daily and Pravachol  80 mg daily.  6.  Acute on chronic renal insufficiency, CKD stage IIIb at baseline.  Creatinine down to 0.8 with GFR greater than 60.  Discussed with patient and hospitalist team.  She is currently on mexiletine 300 mg twice daily, amiodarone  200 mg daily, Eliquis  5 mg twice daily, Farxiga  10 mg daily, and Toprol -XL 25 mg daily.  Cannot resume eplerenone  in house and would not replace with spironolactone  for the time being.  Last addition would be Entresto  24/26 mg twice daily depending on blood pressure overall.  We will continue to follow in background.  For questions or updates, please contact [facility] Please consult [email or web address] for contact info under   Signed, Teddie Favre, MD  12/22/2023, 10:12 AM

## 2023-12-22 NOTE — Progress Notes (Signed)
 PROGRESS NOTE    [name]  WUJ:[id number] DOB: 03-19-1941 DOA: 12/16/2023 PCP: Estill Hemming, DO   Brief Narrative: Heidi Stark is a 83 y.o. female with a history of morbid obesity, diabetes mellitus type 2, hypertension, hyperlipidemia, obstructive sleep apnea on CPAP, consult heart failure, atrial fibrillation, ventricular tachycardia status post ICD placement, CKD, hypothyroidism.  Patient presented secondary to right elbow swelling and pain with evidence of right elbow cellulitis.  Empiric antibiotics started.  Orthopedic surgery was consulted secondary to right elbow swelling with recommendation for no surgical intervention.   Assessment and Plan:  Right elbow cellulitis Present on admission.  Patient has a history of direct injury, with wound present over right elbow. Patient was managed outpatient with cephalexin , however does not appear to have been on it long enough to consider failed treatment. Patient started empirically on Vancomycin , Cefepime  and Flagyl , transitioned to Cefepime . Orthopedic surgery consulted and per their assessment, possibly related to patient pushing herself up in a recliner frequently. Overall, cellulitis appears to be improving, although slowly. Cefepime  transitioned to Cefazolin  IV. Doxycycline  added. CT of right elbow is reassuring. -Continue Cefazolin  IV and doxycycline  -Follow-up orthopedic surgery recommendations (re-consulted on 6/6)  Olecranon bursitis Noted. Orthopedic surgery evaluated; recommendation for no aspiration or I&D.  Chronic HFpEF Noted. LVEF of 70-75% on Transthoracic Echocardiogram obtained this admission.  Paroxysmal atrial fibrillation/flutter Cardiology consulted. Rate controlled. -Cardiology recommendations: amiodarone , mexiletine, Toprol -XL, anticoagulation, possible outpatient DCCV  History of ventricular tachycardia S/p AICD. -Continue amiodarone  and mexiletine  Primary hypertension Mostly well  controlled. -Continue Toprol  XL -Entresto  and eplerenone  held  PAD Noted history. On Eliquis  for atrial fibrillation. -Continue Eliquis   Hyperlipidemia -Continue Vascepa   Diabetes mellitus type 2 Well controlled based on hemoglobin A1C of 6.5%.  AKI on CKD stage IIIa Baseline creatinine of about 1. Creatinine peaked to 1.66. Improved.  Hypothyroidism -Continue Synthroid  50 mcg daily  Anxiety Depression -Resume home Zoloft   Cough Some mild yellow sputum production. Afebrile. No dyspnea. Chest x-ray obtained and shows no evidence of cardiopulmonary disease.  OSA on CPAP -Continue CPAP at bedtime  Morbid obesity, class III Estimated body mass index is 41.03 kg/m as calculated from the following:   Height as of this encounter: 5\' 6"  (1.676 m).   Weight as of this encounter: 115.3 kg.   DVT prophylaxis: Eliquis  Code Status:   Code Status: Full Code Family Communication: Daughter at bedside Disposition Plan: Discharge home pending improvement in erythema in addition to orthopedic surgery recommendations   Consultants:  Orthopedic surgery Cardiology  Procedures:  None  Antimicrobials: Vancomycin  IV Cefepime  IV Flagyl  IV Cefazolin  IV    Subjective: Ice is helping with symptoms a lot, per patient. No issues overnight.  Objective: BP 111/77 (BP Location: Right Arm)   Pulse (!) 58   Temp 97.7 F (36.5 C) (Oral)   Resp 20   Ht 5\' 6"  (1.676 m)   Wt 115.3 kg   SpO2 97%   BMI 41.03 kg/m   Examination:  General exam: Appears calm and comfortable Respiratory system: Clear to auscultation. Respiratory effort normal. Cardiovascular system: S1 & S2 heard, RRR. Gastrointestinal system: Abdomen is nondistended, soft and nontender. Normal bowel sounds heard. Central nervous system: Alert and oriented. No focal neurological deficits. Musculoskeletal: No edema. No calf tenderness. Right elbow with overlying fluctuant mass Skin: No cyanosis. Right elbow with  overlying warmth and erythema that appears less intense. Psychiatry: Judgement and insight appear normal. Mood & affect appropriate.  Data Reviewed: I have personally reviewed following labs and imaging studies  CBC Lab Results  Component Value Date   WBC 10.9 (H) 12/20/2023   RBC 4.64 12/20/2023   HGB 14.2 12/20/2023   HCT 46.4 (H) 12/20/2023   MCV 100.0 12/20/2023   MCH 30.6 12/20/2023   PLT 203 12/20/2023   MCHC 30.6 12/20/2023   RDW 15.2 12/20/2023   LYMPHSABS 1.8 12/16/2023   MONOABS 1.8 (H) 12/16/2023   EOSABS 0.0 12/16/2023   BASOSABS 0.0 12/16/2023     Last metabolic panel Lab Results  Component Value Date   NA 133 (L) 12/20/2023   K 4.9 12/20/2023   CL 100 12/20/2023   CO2 25 12/20/2023   BUN 16 12/20/2023   CREATININE 0.80 12/20/2023   GLUCOSE 114 (H) 12/20/2023   GFRNONAA >60 12/20/2023   GFRAA 65 06/14/2020   CALCIUM  8.2 (L) 12/20/2023   PHOS 3.9 11/10/2023   PROT 6.9 12/16/2023   ALBUMIN 3.5 12/16/2023   LABGLOB 3.1 06/24/2021   AGRATIO 1.4 06/24/2021   BILITOT 1.2 12/16/2023   ALKPHOS 52 12/16/2023   AST 38 12/16/2023   ALT 16 12/16/2023   ANIONGAP 8 12/20/2023    GFR: Estimated Creatinine Clearance: 71.1 mL/min (by C-G formula based on SCr of 0.8 mg/dL).  Recent Results (from the past 240 hours)  Blood Culture (routine x 2)     Status: None   Collection Time: 12/16/23 11:51 PM   Specimen: BLOOD  Result Value Ref Range Status   Specimen Description   Final    BLOOD BLOOD LEFT ARM hand Performed at [facility], [address] [place], [place] [address]    Special Requests   Final    BOTTLES DRAWN AEROBIC AND ANAEROBIC Blood Culture results may not be optimal due to an inadequate volume of blood received in culture bottles Performed at [facility], [address] [place], [place] [address]    Culture   Final    NO GROWTH 5 DAYS Performed at [facility], [address], [place],  [place] [address]    Report Status 12/22/2023 FINAL  Final  Blood Culture (routine x 2)     Status: None   Collection Time: 12/16/23 11:51 PM   Specimen: BLOOD  Result Value Ref Range Status   Specimen Description   Final    BLOOD BLOOD LEFT ARM Performed at [facility], [address] [place], [place] [address]    Special Requests   Final    BOTTLES DRAWN AEROBIC AND ANAEROBIC Blood Culture results may not be optimal due to an inadequate volume of blood received in culture bottles Performed at [facility], [address] [place], [place] [address]    Culture   Final    NO GROWTH 5 DAYS Performed at [facility], [address] [place], [place] [address]    Report Status 12/22/2023 FINAL  Final      Radiology Studies: No results found.     LOS: 4 days    Aneita Keens, MD [facility] 12/22/2023, 12:25 PM   If 7PM-7AM, please contact night-coverage [email or web address]

## 2023-12-22 NOTE — Progress Notes (Signed)
   12/22/23 2350  BiPAP/CPAP/SIPAP  Reason BIPAP/CPAP not in use Non-compliant (PATIENT DOES NOT WANT TO WEAR CPAP TONIGHT)  BiPAP/CPAP /SiPAP Vitals  Resp (!) 25  MEWS Score/Color  MEWS Score 1  MEWS Score Color Heidi Stark

## 2023-12-23 DIAGNOSIS — N1831 Chronic kidney disease, stage 3a: Secondary | ICD-10-CM | POA: Diagnosis not present

## 2023-12-23 DIAGNOSIS — L03119 Cellulitis of unspecified part of limb: Secondary | ICD-10-CM | POA: Diagnosis not present

## 2023-12-23 DIAGNOSIS — E039 Hypothyroidism, unspecified: Secondary | ICD-10-CM | POA: Diagnosis not present

## 2023-12-23 DIAGNOSIS — M25421 Effusion, right elbow: Secondary | ICD-10-CM | POA: Diagnosis not present

## 2023-12-23 LAB — GLUCOSE, CAPILLARY
Glucose-Capillary: 114 mg/dL — ABNORMAL HIGH (ref 70–99)
Glucose-Capillary: 89 mg/dL (ref 70–99)
Glucose-Capillary: 108 mg/dL — ABNORMAL HIGH (ref 70–99)
Glucose-Capillary: 114 mg/dL — ABNORMAL HIGH (ref 70–99)

## 2023-12-23 MED ORDER — CEFADROXIL 500 MG PO CAPS
500.0000 mg | ORAL_CAPSULE | Freq: Two times a day (BID) | ORAL | Status: DC
  Administered 2023-12-23 – 2023-12-27 (×9): 500 mg via ORAL
  Filled 2023-12-23 (×9): qty 1

## 2023-12-23 MED ORDER — MAGNESIUM CITRATE PO SOLN
1.0000 | Freq: Once | ORAL | Status: AC
  Administered 2023-12-23: 1 via ORAL
  Filled 2023-12-23: qty 296

## 2023-12-23 NOTE — Progress Notes (Signed)
 PROGRESS NOTE    IVIS NICOLSON  QMV:[id number] DOB: [birth year] DOA: 12/16/2023 PCP: Estill Hemming, DO   Brief Narrative: Heidi Stark is a 83 y.o. female with a history of morbid obesity, diabetes mellitus type 2, hypertension, hyperlipidemia, obstructive sleep apnea on CPAP, consult heart failure, atrial fibrillation, ventricular tachycardia status post ICD placement, CKD, hypothyroidism.  Patient presented secondary to right elbow swelling and pain with evidence of right elbow cellulitis.  Empiric antibiotics started.  Orthopedic surgery was consulted secondary to right elbow swelling with recommendation for no surgical intervention.   Assessment and Plan:  Right elbow cellulitis Present on admission.  Patient has a history of direct injury, with wound present over right elbow. Patient was managed outpatient with cephalexin , however does not appear to have been on it long enough to consider failed treatment. Patient started empirically on Vancomycin , Cefepime  and Flagyl , transitioned to Cefepime . Orthopedic surgery consulted and per their assessment, possibly related to patient pushing herself up in a recliner frequently. Overall, cellulitis appears to be improving, although slowly. Cefepime  transitioned to Cefazolin  IV. Doxycycline  added. CT of right elbow is reassuring. Orthopedic surgery reevaluated and still recommend no surgical management. -Discontinue Cefazolin  and start Cefadroxil -Continue Doxycycline   Olecranon bursitis Noted. Orthopedic surgery evaluated; recommendation for no aspiration or I&D.  Chronic HFpEF Noted. LVEF of 70-75% on Transthoracic Echocardiogram obtained this admission.  Paroxysmal atrial fibrillation/flutter Cardiology consulted. Rate controlled. -Cardiology recommendations: amiodarone , mexiletine, Toprol -XL, anticoagulation, possible outpatient DCCV  History of ventricular tachycardia S/p AICD. -Continue amiodarone  and mexiletine  Primary  hypertension Mostly well controlled. -Continue Toprol  XL -Entresto  and eplerenone  held secondary to hypotension.  PAD Noted history. On Eliquis  for atrial fibrillation. -Continue Eliquis   Hyperlipidemia -Continue Vascepa   Diabetes mellitus type 2 Well controlled based on hemoglobin A1C of 6.5%.  AKI on CKD stage IIIa Baseline creatinine of about 1. Creatinine peaked to 1.66. Improved.  Hypothyroidism -Continue Synthroid  50 mcg daily  Anxiety Depression -Resume home Zoloft   Cough Some mild yellow sputum production. Afebrile. No dyspnea. Chest x-ray obtained and shows no evidence of cardiopulmonary disease.  OSA on CPAP -Continue CPAP at bedtime  Morbid obesity, class III Estimated body mass index is 41.03 kg/m as calculated from the following:   Height as of this encounter: 5\' 6"  (1.676 m).   Weight as of this encounter: 115.3 kg.   DVT prophylaxis: Eliquis  Code Status:   Code Status: Full Code Family Communication: None at bedside Disposition Plan: Discharge home pending improvement in erythema in addition to orthopedic surgery recommendations   Consultants:  Orthopedic surgery Cardiology  Procedures:  None  Antimicrobials: Vancomycin  IV Cefepime  IV Flagyl  IV Cefazolin  IV    Subjective: Elbow pain is improved.  Objective: BP 115/88   Pulse 62   Temp (!) 97.4 F (36.3 C) (Oral)   Resp 12   Ht 5\' 6"  (1.676 m)   Wt 115.3 kg   SpO2 100%   BMI 41.03 kg/m   Examination:  General exam: Appears calm and comfortable Respiratory system: Clear to auscultation. Respiratory effort normal. Cardiovascular system: S1 & S2 heard, RRR. No murmurs. Gastrointestinal system: Abdomen is nondistended, soft and nontender. Normal bowel sounds heard. Central nervous system: Alert and oriented. No focal neurological deficits. Psychiatry: Judgement and insight appear normal. Mood & affect appropriate.    Data Reviewed: I have personally reviewed following labs  and imaging studies  CBC Lab Results  Component Value Date   WBC 10.9 (H) 12/20/2023   RBC 4.64  12/20/2023   HGB 14.2 12/20/2023   HCT 46.4 (H) 12/20/2023   MCV 100.0 12/20/2023   MCH 30.6 12/20/2023   PLT 203 12/20/2023   MCHC 30.6 12/20/2023   RDW 15.2 12/20/2023   LYMPHSABS 1.8 12/16/2023   MONOABS 1.8 (H) 12/16/2023   EOSABS 0.0 12/16/2023   BASOSABS 0.0 12/16/2023     Last metabolic panel Lab Results  Component Value Date   NA 133 (L) 12/20/2023   K 4.9 12/20/2023   CL 100 12/20/2023   CO2 25 12/20/2023   BUN 16 12/20/2023   CREATININE 0.80 12/20/2023   GLUCOSE 114 (H) 12/20/2023   GFRNONAA >60 12/20/2023   GFRAA 65 06/14/2020   CALCIUM  8.2 (L) 12/20/2023   PHOS 3.9 11/10/2023   PROT 6.9 12/16/2023   ALBUMIN 3.5 12/16/2023   LABGLOB 3.1 06/24/2021   AGRATIO 1.4 06/24/2021   BILITOT 1.2 12/16/2023   ALKPHOS 52 12/16/2023   AST 38 12/16/2023   ALT 16 12/16/2023   ANIONGAP 8 12/20/2023    GFR: Estimated Creatinine Clearance: 71.1 mL/min (by C-G formula based on SCr of 0.8 mg/dL).  Recent Results (from the past 240 hours)  Blood Culture (routine x 2)     Status: None   Collection Time: 12/16/23 11:51 PM   Specimen: BLOOD  Result Value Ref Range Status   Specimen Description   Final    BLOOD BLOOD LEFT ARM hand Performed at [facility], [address], [place], [place] [address]    Special Requests   Final    BOTTLES DRAWN AEROBIC AND ANAEROBIC Blood Culture results may not be optimal due to an inadequate volume of blood received in culture bottles Performed at [facility], [address], [place], [place] [address]    Culture   Final    NO GROWTH 5 DAYS Performed at [facility], [address] [place], [place] [address]    Report Status 12/22/2023 FINAL  Final  Blood Culture (routine x 2)     Status: None   Collection Time: 12/16/23 11:51 PM   Specimen: BLOOD  Result Value Ref Range Status    Specimen Description   Final    BLOOD BLOOD LEFT ARM Performed at [facility], [address], [place], [place] [address]    Special Requests   Final    BOTTLES DRAWN AEROBIC AND ANAEROBIC Blood Culture results may not be optimal due to an inadequate volume of blood received in culture bottles Performed at [facility], [address] [place], [place] [address]    Culture   Final    NO GROWTH 5 DAYS Performed at [facility], [address] [place], [place] [address]    Report Status 12/22/2023 FINAL  Final      Radiology Studies: No results found.     LOS: 5 days    Aneita Keens, MD [facility] 12/23/2023, 1:51 PM   If 7PM-7AM, please contact night-coverage [email or web address]

## 2023-12-23 NOTE — Progress Notes (Signed)
   12/23/23 2018  BiPAP/CPAP/SIPAP  BiPAP/CPAP/SIPAP Pt Type Adult  Reason BIPAP/CPAP not in use Non-compliant (machine taken out of room per pt)  BiPAP/CPAP /SiPAP Vitals  Resp (!) 21  MEWS Score/Color  MEWS Score 2  MEWS Score Color Yellow

## 2023-12-24 ENCOUNTER — Other Ambulatory Visit: Payer: Self-pay | Admitting: Student

## 2023-12-24 ENCOUNTER — Telehealth: Payer: Self-pay | Admitting: Student

## 2023-12-24 DIAGNOSIS — I480 Paroxysmal atrial fibrillation: Secondary | ICD-10-CM | POA: Diagnosis not present

## 2023-12-24 DIAGNOSIS — I10 Essential (primary) hypertension: Secondary | ICD-10-CM | POA: Diagnosis not present

## 2023-12-24 DIAGNOSIS — L03119 Cellulitis of unspecified part of limb: Secondary | ICD-10-CM | POA: Diagnosis not present

## 2023-12-24 DIAGNOSIS — I5023 Acute on chronic systolic (congestive) heart failure: Secondary | ICD-10-CM

## 2023-12-24 DIAGNOSIS — N1831 Chronic kidney disease, stage 3a: Secondary | ICD-10-CM | POA: Diagnosis not present

## 2023-12-24 DIAGNOSIS — M25421 Effusion, right elbow: Secondary | ICD-10-CM | POA: Diagnosis not present

## 2023-12-24 DIAGNOSIS — I5020 Unspecified systolic (congestive) heart failure: Secondary | ICD-10-CM | POA: Diagnosis not present

## 2023-12-24 DIAGNOSIS — I472 Ventricular tachycardia, unspecified: Secondary | ICD-10-CM | POA: Diagnosis not present

## 2023-12-24 DIAGNOSIS — E039 Hypothyroidism, unspecified: Secondary | ICD-10-CM | POA: Diagnosis not present

## 2023-12-24 DIAGNOSIS — I5022 Chronic systolic (congestive) heart failure: Secondary | ICD-10-CM

## 2023-12-24 LAB — GLUCOSE, CAPILLARY
Glucose-Capillary: 151 mg/dL — ABNORMAL HIGH (ref 70–99)
Glucose-Capillary: 106 mg/dL — ABNORMAL HIGH (ref 70–99)
Glucose-Capillary: 117 mg/dL — ABNORMAL HIGH (ref 70–99)
Glucose-Capillary: 96 mg/dL (ref 70–99)

## 2023-12-24 MED ORDER — SACUBITRIL-VALSARTAN 24-26 MG PO TABS
1.0000 | ORAL_TABLET | Freq: Two times a day (BID) | ORAL | Status: DC
  Administered 2023-12-24 – 2023-12-27 (×7): 1 via ORAL
  Filled 2023-12-24 (×7): qty 1

## 2023-12-24 MED ORDER — AZITHROMYCIN 500 MG PO TABS
500.0000 mg | ORAL_TABLET | Freq: Every day | ORAL | Status: AC
  Administered 2023-12-24 – 2023-12-26 (×3): 500 mg via ORAL
  Filled 2023-12-24 (×3): qty 2

## 2023-12-24 MED ORDER — FUROSEMIDE 40 MG PO TABS
40.0000 mg | ORAL_TABLET | Freq: Two times a day (BID) | ORAL | Status: DC
  Administered 2023-12-24 – 2023-12-27 (×6): 40 mg via ORAL
  Filled 2023-12-24 (×6): qty 1

## 2023-12-24 NOTE — Progress Notes (Signed)
 BMET on friday

## 2023-12-24 NOTE — Progress Notes (Unsigned)
 BMET friday

## 2023-12-24 NOTE — Progress Notes (Addendum)
 Progress Note  Patient Name: Heidi Stark Date of Encounter: 12/24/2023  Primary Cardiologist: Peder Bourdon, MD  Interval Summary  Denies any CP.  Slightly SOB.  BP stable at 131/3mmHg  Vital Signs  Vitals:   12/23/23 0932 12/23/23 1951 12/23/23 2018 12/24/23 0500  BP: 115/88 (!) 161/81  131/75  Pulse: 62 (!) 45  (!) 43  Resp:  16 (!) 21   Temp:  97.7 F (36.5 C)  98.6 F (37 C)  TempSrc:  Oral  Oral  SpO2:  96%  99%  Weight:      Height:        Intake/Output Summary (Last 24 hours) at 12/24/2023 1033 Last data filed at 12/24/2023 1000 Gross per 24 hour  Intake 780 ml  Output 350 ml  Net 430 ml   Filed Weights   12/17/23 0505  Weight: 115.3 kg    Physical Exam  GEN: Well nourished, well developed in no acute distress HEENT: Normal NECK: No JVD; No carotid bruits LYMPHATICS: No lymphadenopathy CARDIAC:RRR, no murmurs, rubs, gallops RESPIRATORY:  scattered exp wheezes ABDOMEN: Soft, non-tender, non-distended MUSCULOSKELETAL:  No edema; No deformity  SKIN: Warm and dry NEUROLOGIC:  Alert and oriented x 3 PSYCHIATRIC:  Normal affect   ECG/Telemetry Tele personally reviewed demonstrating atrial flutter with SVR  Labs  Chemistry Recent Labs  Lab 12/18/23 0513 12/19/23 0123 12/20/23 1033  NA  --  133* 133*  K  --  3.8 4.9  CL  --  97* 100  CO2  --  25 25  GLUCOSE  --  121* 114*  BUN  --  25* 16  CREATININE 1.66* 1.27* 0.80  CALCIUM   --  7.7* 8.2*  GFRNONAA 31* 42* >60  ANIONGAP  --  11 8    Hematology Recent Labs  Lab 12/18/23 0513 12/19/23 0123 12/20/23 1033  WBC 14.5* 13.7* 10.9*  RBC 4.44 4.49 4.64  HGB 13.8 14.0 14.2  HCT 44.7 44.6 46.4*  MCV 100.7* 99.3 100.0  MCH 31.1 31.2 30.6  MCHC 30.9 31.4 30.6  RDW 15.0 15.1 15.2  PLT 159 174 203   Lipid Panel     Component Value Date/Time   CHOL 152 09/14/2023 1528   CHOL 170 06/02/2019 0913   TRIG 307 (H) 09/14/2023 1528   HDL 32 (L) 09/14/2023 1528   HDL 31 (L) 06/02/2019 0913    CHOLHDL 4.8 09/14/2023 1528   VLDL 38 04/19/2023 1528   LDLCALC 83 09/14/2023 1528   LDLDIRECT 42.0 11/30/2014 1226   LABVLDL 47 (H) 06/02/2019 0913   Cardiac Studies  Echocardiogram 12/18/2023:  1. Left ventricular ejection fraction, by estimation, is 70 to 75%. The  left ventricle has hyperdynamic function. The left ventricle has no  regional wall motion abnormalities. There is moderate concentric left  ventricular hypertrophy. Left ventricular  diastolic function could not be evaluated.   2. Right ventricular systolic function was not well visualized. The right  ventricular size is not well visualized. There is mildly elevated  pulmonary artery systolic pressure. The estimated right ventricular  systolic pressure is 37.1 mmHg.   3. Left atrial size was severely dilated.   4. The mitral valve is degenerative. No evidence of mitral valve  regurgitation. No evidence of mitral stenosis. Moderate mitral annular  calcification.   5. The aortic valve is tricuspid. There is mild calcification of the  aortic valve. There is mild thickening of the aortic valve. Aortic valve  regurgitation is not visualized. Aortic valve  sclerosis/calcification is  present, without any evidence of  aortic stenosis.   6. There is borderline dilatation of the ascending aorta, measuring 38  mm.   7. The inferior vena cava is normal in size with greater than 50%  respiratory variability, suggesting right atrial pressure of 3 mmHg.   Assessment & Plan  #HFrecEF -LVEF 70 to 75% by echocardiogram during this admission.   -She has been on stable GDMT as an outpatient, held initially in the setting of hypotension but hemodynamics improving and GDMT has been restarted -BP stable -appears euvolemic on exam today>>few wheezes on exam -continue GDMT: Farxiga  10mg  daily, Toprol  XL 25mg  daily -restart Entresto  24-26mg  BID and lasix  40mg  BID -will add back Inspra  25mg  daily at discharge  #Paroxysmal to persistent  atrial fibrillation/flutter -remains in atrial flutter with SVR -CHA2DS2-VASc score is 7.   -remains asymptomatic -continue Apixaban  5mg  BID and Amio 200mg  daily   #History of recurrent VT.   -s/p [employer] ICD  -No recent recurrence. -Continue Amio 200mg  daily and Mexiletine 300mg  BID  #HTN   -BP controlled -continue Toprol  XL 25mg  daily  #Mixed hyperlipidemia.   -Currently on Vascepa  2 g twice daily and Pravachol  80 mg daily.  #Acute on chronic renal insufficiency -CKD stage IIIb at baseline.   -Creatinine down to 0.8 with GFR greater than 60.  She is stable from a cardiac standpoint.  [facility] will sign off.   Medication Recommendations: Amiodarone  200 mg daily, Eliquis  5 mg twice daily, Farxiga  10 mg daily, Entresto  24-26 mg twice daily, eplerenone  25 mg daily, Tricor  145 mg daily, Toprol -XL 25 mg daily, mexiletine 300 mg twice daily, pitivastatin 4 mg daily, Micro-K  10 mEq twice daily, Vascepa  2 g twice daily and Lasix  40mg  BID Other recommendations (labs, testing, etc):  BMET 1 week Follow up as an outpatient:  Dr. Mitzie Anda or HF Impact clinic in 1 week  I spent 35 minutes caring for this patient today face to face, ordering and reviewing labs, reviewing records from 2D echo , seeing the patient, documenting in the record, and arranging for a outpt labs and HF clinic followup    For questions or updates, please contact [facility] Please consult [email or web address] for contact info under   Signed, Gaylyn Keas, MD  12/24/2023, 10:33 AM

## 2023-12-24 NOTE — Telephone Encounter (Signed)
 Placed order for  BMET on Friday (12/29/23). Can someone follow up to remind patient. Will follow up with HF impact clinic in 1 week.

## 2023-12-24 NOTE — Plan of Care (Signed)

## 2023-12-24 NOTE — Progress Notes (Signed)
   12/24/23 2321  BiPAP/CPAP/SIPAP  BiPAP/CPAP/SIPAP Pt Type Adult  Reason BIPAP/CPAP not in use Non-compliant

## 2023-12-24 NOTE — Telephone Encounter (Signed)
 Called and spoke with the pts daughter Heidi Stark (on [place]) about lab appt reminder with our building, for pt to have a repeat BMET done on 6/13.  Per Heidi Stark, pt is currently still inpatient but will mark this down on her books to bring her at that time, if pt is discharged and not discharged to a SNF.    Heidi Stark states that the pt may be going to [facility] for rehab at discharge, but still undecided.  Regardless, Heidi Stark states she will mark this on her calendar, and if the pt does get discharged to [facility], she will mention at discharge for our Providers to write an order for BMET to be drawn at the nursing home instead.  Renee verbalized understanding and agrees with this plan.

## 2023-12-24 NOTE — Progress Notes (Signed)
 PROGRESS NOTE    Heidi Stark  ZOX:[id number] DOB: [birth year] DOA: 12/16/2023 PCP: Estill Hemming, DO   Brief Narrative: Heidi Stark is a 83 y.o. female with a history of morbid obesity, diabetes mellitus type 2, hypertension, hyperlipidemia, obstructive sleep apnea on CPAP, consult heart failure, atrial fibrillation, ventricular tachycardia status post ICD placement, CKD, hypothyroidism.  Patient presented secondary to right elbow swelling and pain with evidence of right elbow cellulitis.  Empiric antibiotics started.  Orthopedic surgery was consulted secondary to right elbow swelling with recommendation for no surgical intervention.   Assessment and Plan:  Right elbow cellulitis Present on admission.  Patient has a history of direct injury, with wound present over right elbow. Patient was managed outpatient with cephalexin , however does not appear to have been on it long enough to consider failed treatment. Patient started empirically on Vancomycin , Cefepime  and Flagyl , transitioned to Cefepime . Orthopedic surgery consulted and per their assessment, possibly related to patient pushing herself up in a recliner frequently. Overall, cellulitis appears to be improving, although slowly. Cefepime  transitioned to Cefazolin  IV. Doxycycline  added. CT of right elbow is reassuring. Orthopedic surgery reevaluated and still recommend no surgical management. -Continue Cefadroxil and doxycycline   Olecranon bursitis Noted. Orthopedic surgery evaluated; recommendation for no aspiration or I&D.  Chronic HFpEF Noted. LVEF of 70-75% on Transthoracic Echocardiogram obtained this admission.  Paroxysmal atrial fibrillation/flutter Cardiology consulted. Rate controlled. -Cardiology recommendations: amiodarone , mexiletine, Toprol -XL, anticoagulation, possible outpatient DCCV  History of ventricular tachycardia S/p AICD. -Continue amiodarone  and mexiletine  Primary hypertension Mostly well  controlled. -Continue Toprol  XL -Entresto  and eplerenone  held secondary to hypotension. Cardiology recommendation to resume home regimen on discharge  PAD Noted history. On Eliquis  for atrial fibrillation. -Continue Eliquis   Hyperlipidemia -Continue Vascepa   Diabetes mellitus type 2 Well controlled based on hemoglobin A1C of 6.5%.  AKI on CKD stage IIIa Baseline creatinine of about 1. Creatinine peaked to 1.66. Improved.  Hypothyroidism -Continue Synthroid  50 mcg daily  Anxiety Depression -Resume home Zoloft   Cough Some mild yellow sputum production. Afebrile. No dyspnea. Chest x-ray obtained and shows no evidence of cardiopulmonary disease.  OSA on CPAP -Continue CPAP at bedtime  Morbid obesity, class III Estimated body mass index is 41.03 kg/m as calculated from the following:   Height as of this encounter: 5\' 6"  (1.676 m).   Weight as of this encounter: 115.3 kg.   DVT prophylaxis: Eliquis  Code Status:   Code Status: Full Code Family Communication: Daughter at bedside Disposition Plan: Discharge SNF pending bed availability and insurance authorization   Consultants:  Orthopedic surgery Cardiology  Procedures:  None  Antimicrobials: Vancomycin  IV Cefepime  IV Flagyl  IV Cefazolin  IV    Subjective: Slow improvement in elbow. Worried about mobility.  Objective: BP (!) 124/54 (BP Location: Right Arm)   Pulse 90   Temp 97.8 F (36.6 C) (Oral)   Resp (!) 22   Ht 5\' 6"  (1.676 m)   Wt 115.3 kg   SpO2 99%   BMI 41.03 kg/m   Examination:  General exam: Appears calm and comfortable Respiratory system: Clear to auscultation. Respiratory effort normal. Cardiovascular system: S1 & S2 heard, RRR. No murmurs. Gastrointestinal system: Abdomen is nondistended, soft and nontender. Normal bowel sounds heard. Central nervous system: Alert and oriented. No focal neurological deficits. Musculoskeletal: No edema. No calf tenderness. Right elbow with overlying  fluctuant mass Skin: No cyanosis. Right elbow with overlying warmth and erythema that appears less intense. Psychiatry: Judgement and insight appear normal. Mood &  affect appropriate.    Data Reviewed: I have personally reviewed following labs and imaging studies  CBC Lab Results  Component Value Date   WBC 10.9 (H) 12/20/2023   RBC 4.64 12/20/2023   HGB 14.2 12/20/2023   HCT 46.4 (H) 12/20/2023   MCV 100.0 12/20/2023   MCH 30.6 12/20/2023   PLT 203 12/20/2023   MCHC 30.6 12/20/2023   RDW 15.2 12/20/2023   LYMPHSABS 1.8 12/16/2023   MONOABS 1.8 (H) 12/16/2023   EOSABS 0.0 12/16/2023   BASOSABS 0.0 12/16/2023     Last metabolic panel Lab Results  Component Value Date   NA 133 (L) 12/20/2023   K 4.9 12/20/2023   CL 100 12/20/2023   CO2 25 12/20/2023   BUN 16 12/20/2023   CREATININE 0.80 12/20/2023   GLUCOSE 114 (H) 12/20/2023   GFRNONAA >60 12/20/2023   GFRAA 65 06/14/2020   CALCIUM  8.2 (L) 12/20/2023   PHOS 3.9 11/10/2023   PROT 6.9 12/16/2023   ALBUMIN 3.5 12/16/2023   LABGLOB 3.1 06/24/2021   AGRATIO 1.4 06/24/2021   BILITOT 1.2 12/16/2023   ALKPHOS 52 12/16/2023   AST 38 12/16/2023   ALT 16 12/16/2023   ANIONGAP 8 12/20/2023    GFR: Estimated Creatinine Clearance: 71.1 mL/min (by C-G formula based on SCr of 0.8 mg/dL).  Recent Results (from the past 240 hours)  Blood Culture (routine x 2)     Status: None   Collection Time: 12/16/23 11:51 PM   Specimen: BLOOD  Result Value Ref Range Status   Specimen Description   Final    BLOOD BLOOD LEFT ARM hand Performed at [facility], [address] [place], [place] [address]    Special Requests   Final    BOTTLES DRAWN AEROBIC AND ANAEROBIC Blood Culture results may not be optimal due to an inadequate volume of blood received in culture bottles Performed at [facility], [address] [place], [place] [address]    Culture   Final    NO GROWTH 5 DAYS Performed at  [facility], [address] [place], [place] [address]    Report Status 12/22/2023 FINAL  Final  Blood Culture (routine x 2)     Status: None   Collection Time: 12/16/23 11:51 PM   Specimen: BLOOD  Result Value Ref Range Status   Specimen Description   Final    BLOOD BLOOD LEFT ARM Performed at [facility], [address] [place], [place] [address]    Special Requests   Final    BOTTLES DRAWN AEROBIC AND ANAEROBIC Blood Culture results may not be optimal due to an inadequate volume of blood received in culture bottles Performed at [facility], [address], [place], [place] [address]    Culture   Final    NO GROWTH 5 DAYS Performed at [facility], [address], [place], [place] [address]    Report Status 12/22/2023 FINAL  Final      Radiology Studies: No results found.     LOS: 6 days    Aneita Keens, MD [facility] 12/24/2023, 2:46 PM   If 7PM-7AM, please contact night-coverage [email or web address]

## 2023-12-24 NOTE — Progress Notes (Signed)
 Physical Therapy Treatment Patient Details Name: Heidi Stark MRN: [id number] DOB: 17-Oct-1940 Today's Date: 12/24/2023   History of Present Illness Heidi Stark is a 83 y.o. female presents to the hospital with Rt elbow swelling and pain. Admitted for Rt elbow cellulitis to be treated with IV antibiotics. PMH significant of with past medical history of HTN, DM 2, HLD, P A-fib on Eliquis , splenic and renal infarcts, congestive heart failure with recovered ejection fraction 65%, hypothyroidism, history of VT.    PT Comments  Pt agreeable to working with therapy. She reports she now has concerns going home. She has required varying levels of assistance over last few days. She reports she required +2 assist from toilet on one occasion. Pt also reports that her daughter has a shoulder injury and can only provide limited assistance currently. Discussed d/c plan on today-pt is concerned that she may not be able to manage/mobilize safely at home. She is now open to a post acute rehab stay if possible. Patient will benefit from continued inpatient follow up therapy, <3 hours/day     If plan is discharge home, recommend the following: A little help with walking and/or transfers;A little help with bathing/dressing/bathroom;Assistance with cooking/housework;Assist for transportation;Help with stairs or ramp for entrance   Can travel by private vehicle        Equipment Recommendations  None recommended by PT    Recommendations for Other Services       Precautions / Restrictions Precautions Precautions: Fall Restrictions Weight Bearing Restrictions Per Provider Order: No     Mobility  Bed Mobility Overal bed mobility: Needs Assistance Bed Mobility: Supine to Sit     Supine to sit: Min assist, Used rails, HOB elevated     General bed mobility comments: Increased time and effort on today. Assist for trunk to upright-once upright, pt able to scoot to EOB.    Transfers Overall transfer  level: Needs assistance Equipment used: Rolling walker (2 wheels) Transfers: Sit to/from Stand Sit to Stand: From elevated surface, Min assist           General transfer comment: Assist to rise steady, control descent, stabilize walker-pt prefers to pull up on walker. Increased time. Cues for safety.    Ambulation/Gait Ambulation/Gait assistance: Min assist Gait Distance (Feet): 5 Feet Assistive device: Rolling walker (2 wheels) Gait Pattern/deviations: Decreased step length - right, Decreased step length - left, Decreased stride length       General Gait Details: Cues for RW proximity. Close follow with recliner-used recliner to transport pt back to bedside. Pt only able to tolerate walking ~ 5 feet. Distance limited by back pain. Pt also fatigues easily/fairly quickly. Dyspnea 2/4. O2 95% on RA   Stairs             Wheelchair Mobility     Tilt Bed    Modified Rankin (Stroke Patients Only)       Balance Overall balance assessment: Needs assistance         Standing balance support: Bilateral upper extremity supported, During functional activity, Reliant on assistive device for balance Standing balance-Leahy Scale: Poor                              Communication Communication Communication: No apparent difficulties  Cognition Arousal: Alert Behavior During Therapy: WFL for tasks assessed/performed   PT - Cognitive impairments: No apparent impairments  Following commands: Intact      Cueing Cueing Techniques: Verbal cues  Exercises      General Comments        Pertinent Vitals/Pain Pain Assessment Pain Assessment: Faces Faces Pain Scale: Hurts even more Pain Location: R elbow, chronic back pain Pain Descriptors / Indicators: Aching Pain Intervention(s): Limited activity within patient's tolerance, Monitored during session, Repositioned    Home Living                          Prior  Function            PT Goals (current goals can now be found in the care plan section) Progress towards PT goals: Progressing toward goals    Frequency    Min 2X/week      PT Plan      Co-evaluation              AM-PAC PT "6 Clicks" Mobility   Outcome Measure  Help needed turning from your back to your side while in a flat bed without using bedrails?: A Little Help needed moving from lying on your back to sitting on the side of a flat bed without using bedrails?: A Little Help needed moving to and from a bed to a chair (including a wheelchair)?: A Little Help needed standing up from a chair using your arms (e.g., wheelchair or bedside chair)?: A Little Help needed to walk in hospital room?: A Lot Help needed climbing 3-5 steps with a railing? : A Lot 6 Click Score: 16    End of Session Equipment Utilized During Treatment: Gait belt Activity Tolerance: Patient limited by fatigue;Patient limited by pain Patient left: in chair;with call bell/phone within reach   PT Visit Diagnosis: Other abnormalities of gait and mobility (R26.89);Muscle weakness (generalized) (M62.81);Difficulty in walking, not elsewhere classified (R26.2)     Time: 1340-1404 PT Time Calculation (min) (ACUTE ONLY): 24 min  Charges:    $Gait Training: 8-22 mins $Therapeutic Activity: 8-22 mins PT General Charges $$ ACUTE PT VISIT: 1 Visit                         Tanda Falter, PT Acute Rehabilitation  Office: [phone]

## 2023-12-25 DIAGNOSIS — N1831 Chronic kidney disease, stage 3a: Secondary | ICD-10-CM | POA: Diagnosis not present

## 2023-12-25 DIAGNOSIS — M25421 Effusion, right elbow: Secondary | ICD-10-CM | POA: Diagnosis not present

## 2023-12-25 DIAGNOSIS — L03119 Cellulitis of unspecified part of limb: Secondary | ICD-10-CM | POA: Diagnosis not present

## 2023-12-25 DIAGNOSIS — E039 Hypothyroidism, unspecified: Secondary | ICD-10-CM | POA: Diagnosis not present

## 2023-12-25 LAB — GLUCOSE, CAPILLARY
Glucose-Capillary: 83 mg/dL (ref 70–99)
Glucose-Capillary: 116 mg/dL — ABNORMAL HIGH (ref 70–99)
Glucose-Capillary: 91 mg/dL (ref 70–99)
Glucose-Capillary: 85 mg/dL (ref 70–99)

## 2023-12-25 NOTE — Progress Notes (Signed)
 PROGRESS NOTE    Heidi Stark  WUJ:[id number] DOB: March 01, 1941 DOA: 12/16/2023 PCP: Estill Hemming, DO   Brief Narrative: Heidi Stark is a 83 y.o. female with a history of morbid obesity, diabetes mellitus type 2, hypertension, hyperlipidemia, obstructive sleep apnea on CPAP, consult heart failure, atrial fibrillation, ventricular tachycardia status post ICD placement, CKD, hypothyroidism.  Patient presented secondary to right elbow swelling and pain with evidence of right elbow cellulitis.  Empiric antibiotics started.  Orthopedic surgery was consulted secondary to right elbow swelling with recommendation for no surgical intervention.   Assessment and Plan:  Right elbow cellulitis Present on admission.  Patient has a history of direct injury, with wound present over right elbow. Patient was managed outpatient with cephalexin , however does not appear to have been on it long enough to consider failed treatment. Patient started empirically on Vancomycin , Cefepime  and Flagyl , transitioned to Cefepime . Orthopedic surgery consulted and per their assessment, possibly related to patient pushing herself up in a recliner frequently. Overall, cellulitis appears to be improving, although slowly. Cefepime  transitioned to Cefazolin  IV. Doxycycline  added. CT of right elbow is reassuring. Orthopedic surgery reevaluated and still recommend no surgical management. -Continue Cefadroxil and doxycycline ; will likely treat for 10-14 days  Olecranon bursitis Noted. Orthopedic surgery evaluated; recommendation for no aspiration or I&D.  Chronic HFpEF Noted. LVEF of 70-75% on Transthoracic Echocardiogram obtained this admission.  Paroxysmal atrial fibrillation/flutter Cardiology consulted. Rate controlled. -Cardiology recommendations: amiodarone , mexiletine, Toprol -XL, anticoagulation, possible outpatient DCCV  History of ventricular tachycardia S/p AICD. -Continue amiodarone  and  mexiletine  Primary hypertension Mostly well controlled. -Continue Toprol  XL -Entresto  and eplerenone  held secondary to hypotension. Cardiology recommendation to resume home regimen on discharge  PAD Noted history. On Eliquis  for atrial fibrillation. -Continue Eliquis   Hyperlipidemia -Continue Vascepa   Diabetes mellitus type 2 Well controlled based on hemoglobin A1C of 6.5%.  AKI on CKD stage IIIa Baseline creatinine of about 1. Creatinine peaked to 1.66. Improved.  Hypothyroidism -Continue Synthroid  50 mcg daily  Anxiety Depression -Continue Zoloft   Cough Some mild yellow sputum production. Afebrile. No dyspnea. Chest x-ray obtained and shows no evidence of cardiopulmonary disease. Azithromycin  ordered and patient's symptoms have improved. -Complete Azithromycin  500 mg x3 days total duration  OSA on CPAP -Continue CPAP at bedtime  Morbid obesity, class III Estimated body mass index is 41.03 kg/m as calculated from the following:   Height as of this encounter: 5\' 6"  (1.676 m).   Weight as of this encounter: 115.3 kg.   DVT prophylaxis: Eliquis  Code Status:   Code Status: Full Code Family Communication: None at bedside Disposition Plan: Discharge SNF pending bed availability and insurance authorization   Consultants:  Orthopedic surgery Cardiology  Procedures:  None  Antimicrobials: Vancomycin  IV Cefepime  IV Flagyl  IV Cefazolin  IV    Subjective: Slow improvement in elbow. Worried about mobility.  Objective: BP 110/64   Pulse 60   Temp (!) 97.5 F (36.4 C) (Oral)   Resp 16   Ht 5\' 6"  (1.676 m)   Wt 115.3 kg   SpO2 100%   BMI 41.03 kg/m   Examination:  General exam: Appears calm and comfortable Respiratory system: Clear to auscultation. Respiratory effort normal. Cardiovascular system: S1 & S2 heard, RRR. No murmurs. Gastrointestinal system: Abdomen is nondistended, soft and nontender. Normal bowel sounds heard. Central nervous system:  Alert and oriented. No focal neurological deficits. Musculoskeletal: No edema. No calf tenderness. Right elbow with overlying fluctuant mass Skin: No cyanosis. Right elbow with  overlying warmth and erythema that appears less intense. Psychiatry: Judgement and insight appear normal. Mood & affect appropriate.    Data Reviewed: I have personally reviewed following labs and imaging studies  CBC Lab Results  Component Value Date   WBC 10.9 (H) 12/20/2023   RBC 4.64 12/20/2023   HGB 14.2 12/20/2023   HCT 46.4 (H) 12/20/2023   MCV 100.0 12/20/2023   MCH 30.6 12/20/2023   PLT 203 12/20/2023   MCHC 30.6 12/20/2023   RDW 15.2 12/20/2023   LYMPHSABS 1.8 12/16/2023   MONOABS 1.8 (H) 12/16/2023   EOSABS 0.0 12/16/2023   BASOSABS 0.0 12/16/2023     Last metabolic panel Lab Results  Component Value Date   NA 133 (L) 12/20/2023   K 4.9 12/20/2023   CL 100 12/20/2023   CO2 25 12/20/2023   BUN 16 12/20/2023   CREATININE 0.80 12/20/2023   GLUCOSE 114 (H) 12/20/2023   GFRNONAA >60 12/20/2023   GFRAA 65 06/14/2020   CALCIUM  8.2 (L) 12/20/2023   PHOS 3.9 11/10/2023   PROT 6.9 12/16/2023   ALBUMIN 3.5 12/16/2023   LABGLOB 3.1 06/24/2021   AGRATIO 1.4 06/24/2021   BILITOT 1.2 12/16/2023   ALKPHOS 52 12/16/2023   AST 38 12/16/2023   ALT 16 12/16/2023   ANIONGAP 8 12/20/2023    GFR: Estimated Creatinine Clearance: 71.1 mL/min (by C-G formula based on SCr of 0.8 mg/dL).  Recent Results (from the past 240 hours)  Blood Culture (routine x 2)     Status: None   Collection Time: 12/16/23 11:51 PM   Specimen: BLOOD  Result Value Ref Range Status   Specimen Description   Final    BLOOD BLOOD LEFT ARM hand Performed at [facility], [address] [place], [place] [address]    Special Requests   Final    BOTTLES DRAWN AEROBIC AND ANAEROBIC Blood Culture results may not be optimal due to an inadequate volume of blood received in culture bottles Performed at [facility], [address], [place], [place] [address]    Culture   Final    NO GROWTH 5 DAYS Performed at [facility], [address], [place], [place] [address]    Report Status 12/22/2023 FINAL  Final  Blood Culture (routine x 2)     Status: None   Collection Time: 12/16/23 11:51 PM   Specimen: BLOOD  Result Value Ref Range Status   Specimen Description   Final    BLOOD BLOOD LEFT ARM Performed at [facility], [address], [place], [place] [address]    Special Requests   Final    BOTTLES DRAWN AEROBIC AND ANAEROBIC Blood Culture results may not be optimal due to an inadequate volume of blood received in culture bottles Performed at [facility], [name], [place], [place] [address]    Culture   Final    NO GROWTH 5 DAYS Performed at [facility], [address], [place], [place] [address]    Report Status 12/22/2023 FINAL  Final      Radiology Studies: No results found.     LOS: 7 days    Aneita Keens, MD [facility] 12/25/2023, 9:23 AM   If 7PM-7AM, please contact night-coverage [email or web address]

## 2023-12-25 NOTE — Plan of Care (Signed)
  Problem: Education: Goal: Knowledge of General Education information will improve Description: Including pain rating scale, medication(s)/side effects and non-pharmacologic comfort measures Outcome: Progressing   Problem: Health Behavior/Discharge Planning: Goal: Ability to manage health-related needs will improve Outcome: Progressing   Problem: Clinical Measurements: Goal: Ability to maintain clinical measurements within normal limits will improve Outcome: Progressing Goal: Will remain free from infection Outcome: Progressing Goal: Diagnostic test results will improve Outcome: Progressing Goal: Respiratory complications will improve Outcome: Progressing Goal: Cardiovascular complication will be avoided Outcome: Progressing   Problem: Activity: Goal: Risk for activity intolerance will decrease Outcome: Progressing   Problem: Nutrition: Goal: Adequate nutrition will be maintained Outcome: Progressing   Problem: Coping: Goal: Level of anxiety will decrease Outcome: Progressing   Problem: Elimination: Goal: Will not experience complications related to bowel motility Outcome: Progressing Goal: Will not experience complications related to urinary retention Outcome: Progressing   Problem: Pain Managment: Goal: General experience of comfort will improve and/or be controlled Outcome: Progressing   Problem: Safety: Goal: Ability to remain free from injury will improve Outcome: Progressing   Problem: Skin Integrity: Goal: Risk for impaired skin integrity will decrease Outcome: Progressing   Problem: Education: Goal: Ability to describe self-care measures that may prevent or decrease complications (Diabetes Survival Skills Education) will improve Outcome: Progressing Goal: Individualized Educational Video(s) Outcome: Progressing   Problem: Coping: Goal: Ability to adjust to condition or change in health will improve Outcome: Progressing   Problem: Health  Behavior/Discharge Planning: Goal: Ability to identify and utilize available resources and services will improve Outcome: Progressing Goal: Ability to manage health-related needs will improve Outcome: Progressing   Problem: Metabolic: Goal: Ability to maintain appropriate glucose levels will improve Outcome: Progressing   Problem: Nutritional: Goal: Maintenance of adequate nutrition will improve Outcome: Progressing Goal: Progress toward achieving an optimal weight will improve Outcome: Progressing   Problem: Skin Integrity: Goal: Risk for impaired skin integrity will decrease Outcome: Progressing   Problem: Tissue Perfusion: Goal: Adequacy of tissue perfusion will improve Outcome: Progressing

## 2023-12-25 NOTE — [place] FL2 (Signed)
 [place]  MEDICAID FL2 LEVEL OF CARE FORM     IDENTIFICATION  Patient Name: Heidi Stark Birthdate: 08-30-1940 Sex: female Admission Date (Current Location): 12/16/2023  [facility] and [place] Number:  Producer, television/film/video and Address:  [facility],  [address] [place], [place] [address]      Provider Number: [id number]  Attending Physician Name and Address:  Verlyn Goad, MD  Relative Name and Phone Number:  Adel Aden (daughter) Ph: [phone]    Current Level of Care: Hospital Recommended Level of Care: Skilled Nursing Facility Prior Approval Number:    Date Approved/Denied:   PASRR Number: [id number]  Discharge Plan: SNF    Current Diagnoses: Patient Active Problem List   Diagnosis Date Noted   Cellulitis of elbow 12/17/2023   Elbow swelling, right 12/17/2023   Olecranon bursitis 12/17/2023   Knee effusion, right 11/09/2023   Type 2 diabetes mellitus with hyperlipidemia (HCC) 05/16/2023   Hypothyroidism 05/16/2023   Cellulitis of left lower extremity 05/13/2023   Compression fracture of lumbar spine, non-traumatic, sequela 05/13/2023   Acute on chronic diastolic CHF (congestive heart failure) (HCC) 05/13/2023   Diabetic nephropathy (HCC) 10/13/2021   Hyperglycemia due to type 2 diabetes mellitus (HCC) 10/13/2021   Proteinuria 10/13/2021   Pure hypercholesterolemia 10/13/2021   COVID-19 04/15/2021   Respiratory failure with hypoxia (HCC) 04/15/2021   Systolic CHF (HCC) 04/15/2021   CKD (chronic kidney disease) stage 3, GFR 30-59 ml/min (HCC) 04/15/2021   Elevated troponin 04/15/2021   Acute on chronic systolic (congestive) heart failure (HCC) 09/14/2020   Wide-complex tachycardia 09/07/2020   Ventricular tachyarrhythmia (HCC) 09/07/2020   VT (ventricular tachycardia) (HCC) 09/07/2020   Persistent atrial fibrillation (HCC) 04/30/2018   Wound of left leg 10/01/2017   Cellulitis of right lower extremity 11/27/2016   Encounter for assessment  for deep vein thrombosis (DVT) 11/14/2016   Hypokalemia 08/13/2016   Type 2 diabetes mellitus with hyperglycemia, with long-term current use of insulin  (HCC)    Atherosclerosis of aorta (HCC) 06/26/2016   Colitis 11/24/2014   Severe obesity (BMI >= 40) (HCC) 11/12/2013   Paroxysmal atrial fibrillation (HCC) 11/03/2013   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 10/28/2013   COLONIC POLYPS 08/16/2010   PAD (peripheral artery disease) (HCC) 08/15/2010   HLD (hyperlipidemia) 11/18/2009   MYALGIA 11/18/2009   OBESITY 05/14/2007   PULMONARY NODULE, RIGHT MIDDLE LOBE 05/14/2007   Obstructive sleep apnea 12/06/2006   Essential hypertension 12/06/2006   COPD (chronic obstructive pulmonary disease) (HCC) 12/06/2006    Orientation RESPIRATION BLADDER Height & Weight     Self, Time, Situation, Place  O2 (2L/min) Incontinent Weight: 254 lb 3.1 oz (115.3 kg) Height:  5\' 6"  (167.6 cm)  BEHAVIORAL SYMPTOMS/MOOD NEUROLOGICAL BOWEL NUTRITION STATUS      Incontinent Diet (Heart healthy/carb modified)  AMBULATORY STATUS COMMUNICATION OF NEEDS Skin   Limited Assist Verbally Skin abrasions, Other (Comment) (Abrasions: right arm, left elbow; Erythema: right breast, buttocks; Ecchymosis: bilateral arms)                       Personal Care Assistance Level of Assistance  Bathing, Feeding, Dressing Bathing Assistance: Limited assistance Feeding assistance: Independent Dressing Assistance: Limited assistance     Functional Limitations Info  Sight, Hearing, Speech Sight Info: Impaired Hearing Info: Adequate Speech Info: Adequate    SPECIAL CARE FACTORS FREQUENCY  PT (By licensed PT), OT (By licensed OT)     PT Frequency: 5x's/week  OT Frequency: 5x's/week            Contractures Contractures Info: Not present    Additional Factors Info  Code Status, Allergies, Psychotropic, Insulin  Sliding Scale Code Status Info: Full Allergies Info: Neomycin-bacitracin  Zn-polymyx, Sulfa Antibiotics, Ciprofloxacin , Spironolactone  Psychotropic Info: See discharge summary Insulin  Sliding Scale Info: See discharge summary       Current Medications (12/25/2023):  This is the current hospital active medication list Current Facility-Administered Medications  Medication Dose Route Frequency Provider Last Rate Last Admin   acetaminophen  (TYLENOL ) tablet 1,000 mg  1,000 mg Oral TID Dahal, Binaya, MD   1,000 mg at 12/25/23 0900   amiodarone  (PACERONE ) tablet 200 mg  200 mg Oral Daily Angelene Kelly, MD   200 mg at 12/25/23 0900   apixaban  (ELIQUIS ) tablet 5 mg  5 mg Oral BID Verlyn Goad, MD   5 mg at 12/25/23 0900   azithromycin  (ZITHROMAX ) tablet 500 mg  500 mg Oral Daily Verlyn Goad, MD   500 mg at 12/25/23 0859   cefadroxil (DURICEF) capsule 500 mg  500 mg Oral BID Verlyn Goad, MD   500 mg at 12/25/23 0901   dapagliflozin  propanediol (FARXIGA ) tablet 10 mg  10 mg Oral Daily Goodrich, Callie E, PA-C   10 mg at 12/25/23 0901   doxycycline  (VIBRA -TABS) tablet 100 mg  100 mg Oral Q12H Verlyn Goad, MD   100 mg at 12/25/23 0900   fenofibrate  tablet 160 mg  160 mg Oral Daily Angelene Kelly, MD   160 mg at 12/25/23 0900   furosemide  (LASIX ) tablet 40 mg  40 mg Oral BID Turner, Traci R, MD   40 mg at 12/25/23 0741   gabapentin  (NEURONTIN ) capsule 300 mg  300 mg Oral BID PRN Verlyn Goad, MD   300 mg at 12/24/23 2130   guaiFENesin -dextromethorphan (ROBITUSSIN DM) 100-10 MG/5ML syrup 5 mL  5 mL Oral Q4H PRN Verlyn Goad, MD       icosapent  Ethyl (VASCEPA ) 1 g capsule 2 g  2 g Oral BID Kakrakandy, Arshad N, MD   2 g at 12/25/23 0901   insulin  aspart (novoLOG ) injection 0-9 Units  0-9 Units Subcutaneous TID WC Kakrakandy, Arshad N, MD   2 Units at 12/24/23 0820   insulin  aspart protamine - aspart (NOVOLOG  MIX 70/30) injection 10 Units  10 Units Subcutaneous BID WC Kakrakandy, Arshad N, MD   10 Units at 12/25/23 0741   levothyroxine  (SYNTHROID )  tablet 50 mcg  50 mcg Oral Daily Kakrakandy, Arshad N, MD   50 mcg at 12/25/23 0539   melatonin tablet 5 mg  5 mg Oral QHS PRN Verlyn Goad, MD   5 mg at 12/24/23 0104   metoprolol  succinate (TOPROL -XL) 24 hr tablet 25 mg  25 mg Oral Daily Goodrich, Callie E, PA-C   25 mg at 12/25/23 0859   mexiletine (MEXITIL ) capsule 300 mg  300 mg Oral BID Kakrakandy, Arshad N, MD   300 mg at 12/25/23 0901   oxyCODONE  (Oxy IR/ROXICODONE ) immediate release tablet 5 mg  5 mg Oral Q6H PRN Dahal, Binaya, MD   5 mg at 12/24/23 2129   polyethylene glycol (MIRALAX  / GLYCOLAX ) packet 17 g  17 g Oral BID Verlyn Goad, MD   17 g at 12/24/23 2120   pravastatin  (PRAVACHOL ) tablet 80 mg  80 mg Oral q1800 Kakrakandy, Arshad N, MD   80 mg at 12/24/23 1731   sacubitril -valsartan  (ENTRESTO ) 24-26 mg  per tablet  1 tablet Oral BID Jacqueline Matsu, MD   1 tablet at 12/25/23 0901   sertraline  (ZOLOFT ) tablet 25 mg  25 mg Oral Daily Verlyn Goad, MD   25 mg at 12/25/23 0901   sodium chloride  flush (NS) 0.9 % injection 10-40 mL  10-40 mL Intracatheter PRN Angelene Kelly, MD         Discharge Medications: Please see discharge summary for a list of discharge medications.  Relevant Imaging Results:  Relevant Lab Results:   Additional Information SSN: [id number]  Zenon Hilda, LCSW

## 2023-12-25 NOTE — TOC Progression Note (Signed)
 Transition of Care [facility]) - Progression Note   Patient Details  Name: Heidi Stark MRN: [id number] Date of Birth: 1941-05-11  Transition [facility] Care [facility]) CM/SW Contact  Zenon Hilda, LCSW Phone Number: 12/25/2023, 10:27 AM  Clinical Narrative: PT evaluation now recommending SNF. Patient is agreeable to being faxed out and requested [facility] as first choice. FL2 done; PASRR confirmed. Initial referral faxed out. CSW reached out to [place] in admissions at [facility] to have the referral reviewed. TOC awaiting bed offers.  Expected Discharge Plan: Skilled Nursing Facility Barriers to Discharge: Continued Medical Work up, English as a second language teacher, SNF Pending bed offer  Expected Discharge Plan and Services In-house Referral: Clinical Social Work Post Acute Care Choice: Skilled Nursing Facility Living arrangements for the past 2 months: Single Family Home            DME Arranged: N/A DME Agency: NA  Social Determinants of Health (SDOH) Interventions SDOH Screenings   Food Insecurity: No Food Insecurity (12/17/2023)  Housing: Low Risk  (12/17/2023)  Transportation Needs: No Transportation Needs (12/17/2023)  Utilities: Not At Risk (12/17/2023)  Alcohol Screen: Low Risk  (10/13/2021)  Depression (PHQ2-9): Low Risk  (09/14/2023)  Financial Resource Strain: Medium Risk (10/13/2021)  Physical Activity: Insufficiently Active (10/13/2021)  Social Connections: Moderately Integrated (12/17/2023)  Stress: No Stress Concern Present (10/13/2021)  Tobacco Use: Medium Risk (12/17/2023)   Readmission Risk Interventions    11/12/2023   10:09 AM  Readmission Risk Prevention Plan  Transportation Screening Complete  PCP or Specialist Appt within 3-5 Days Complete  HRI or Home Care Consult Complete  Social Work Consult for Recovery Care Planning/Counseling Complete  Palliative Care Screening Not Applicable  Medication Review Oceanographer) Complete

## 2023-12-26 DIAGNOSIS — M25421 Effusion, right elbow: Secondary | ICD-10-CM | POA: Diagnosis not present

## 2023-12-26 LAB — GLUCOSE, CAPILLARY
Glucose-Capillary: 122 mg/dL — ABNORMAL HIGH (ref 70–99)
Glucose-Capillary: 163 mg/dL — ABNORMAL HIGH (ref 70–99)
Glucose-Capillary: 89 mg/dL (ref 70–99)
Glucose-Capillary: 93 mg/dL (ref 70–99)

## 2023-12-26 NOTE — Progress Notes (Signed)
 Physical Therapy Treatment Patient Details Name: Heidi Stark MRN: [id number] DOB: 1940/10/06 Today's Date: 12/26/2023   History of Present Illness Heidi Stark is a 83 y.o. female presents to the hospital with Rt elbow swelling and pain. Admitted for Rt elbow cellulitis to be treated with IV antibiotics. PMH significant of with past medical history of HTN, DM 2, HLD, P A-fib on Eliquis , splenic and renal infarcts, congestive heart failure with recovered ejection fraction 65%, hypothyroidism, history of VT.    PT Comments  Pt seen for PT tx with pt agreeable, daughter present in room. Pt reports need to use restroom, is able to complete sit<>stand from various surfaces with RW & supervision. Pt ambulates in room & bathroom with supervision, demonstrating good ability to take lateral steps in small bathroom without assistance. Pt does note fatigue after toileting, requires seated rest break prior to standing for hand hygiene (encouragement to attempt). Pt is hopeful to d/c to rehab to get stronger prior to returning home with family. PT educated pt on importance of mobilizing on her own when she goes home, vs just with HHPT. Will continue to follow pt acutely to progress endurance, balance, & gait with LRAD.    If plan is discharge home, recommend the following: A little help with walking and/or transfers;A little help with bathing/dressing/bathroom;Assistance with cooking/housework;Assist for transportation;Help with stairs or ramp for entrance   Can travel by private vehicle     Yes  Equipment Recommendations  None recommended by PT    Recommendations for Other Services       Precautions / Restrictions Precautions Precautions: Fall Restrictions Weight Bearing Restrictions Per Provider Order: No     Mobility  Bed Mobility               General bed mobility comments: not tested, pt received & left sitting in recliner    Transfers   Equipment used: Rolling walker (2  wheels) Transfers: Sit to/from Stand Sit to Stand: From elevated surface, Supervision           General transfer comment: sit<>stand from recliner (pillows in seat), elevated seat over toilet with grab bar, slightly extra time to power up to standing, supervision overall    Ambulation/Gait Ambulation/Gait assistance: Supervision Gait Distance (Feet): 10 Feet (+ 10 ft) Assistive device: Rolling walker (2 wheels) Gait Pattern/deviations: Decreased step length - right, Decreased step length - left, Decreased stride length Gait velocity: decreased     General Gait Details: Pt able to side step in small bathroom with RW & supervision to access toilet.   Stairs             Wheelchair Mobility     Tilt Bed    Modified Rankin (Stroke Patients Only)       Balance Overall balance assessment: Needs assistance Sitting-balance support: Feet supported, Bilateral upper extremity supported Sitting balance-Leahy Scale: Fair     Standing balance support: Bilateral upper extremity supported, During functional activity, Reliant on assistive device for balance Standing balance-Leahy Scale: Fair                              Hotel manager: No apparent difficulties  Cognition Arousal: Alert Behavior During Therapy: WFL for tasks assessed/performed   PT - Cognitive impairments: No apparent impairments                         Following  commands: Intact      Cueing Cueing Techniques: Verbal cues  Exercises      General Comments General comments (skin integrity, edema, etc.): Pt toileted without assistance.      Pertinent Vitals/Pain Pain Assessment Pain Assessment: Faces Faces Pain Scale: Hurts even more Pain Location: chronic back pain Pain Descriptors / Indicators: Discomfort Pain Intervention(s): Monitored during session, Limited activity within patient's tolerance    Home Living                           Prior Function            PT Goals (current goals can now be found in the care plan section) Acute Rehab PT Goals Patient Stated Goal: go to rehab for a week then go home PT Goal Formulation: With patient Time For Goal Achievement: 12/31/23 Potential to Achieve Goals: Good Progress towards PT goals: Progressing toward goals    Frequency    Min 2X/week      PT Plan      Co-evaluation              AM-PAC PT 6 Clicks Mobility   Outcome Measure  Help needed turning from your back to your side while in a flat bed without using bedrails?: A Little Help needed moving from lying on your back to sitting on the side of a flat bed without using bedrails?: A Lot Help needed moving to and from a bed to a chair (including a wheelchair)?: A Little Help needed standing up from a chair using your arms (e.g., wheelchair or bedside chair)?: A Little Help needed to walk in hospital room?: A Little Help needed climbing 3-5 steps with a railing? : A Lot 6 Click Score: 16    End of Session Equipment Utilized During Treatment: Gait belt Activity Tolerance: Patient limited by fatigue;Patient limited by pain (chronic back pain) Patient left: in chair;with call bell/phone within reach;with family/visitor present Nurse Communication: Mobility status PT Visit Diagnosis: Other abnormalities of gait and mobility (R26.89);Muscle weakness (generalized) (M62.81);Difficulty in walking, not elsewhere classified (R26.2)     Time: 1610-9604 PT Time Calculation (min) (ACUTE ONLY): 25 min  Charges:    $Therapeutic Activity: 23-37 mins PT General Charges $$ ACUTE PT VISIT: 1 Visit                     Emaline Handsome, PT, DPT 12/26/23, 2:20 PM   Venetta Gill 12/26/2023, 2:19 PM

## 2023-12-26 NOTE — TOC Progression Note (Signed)
 Transition of Care [facility]) - Progression Note   Patient Details  Name: Heidi Stark MRN: [id number] Date of Birth: Mar 18, 1941  [facility]) CM/SW Contact  Zenon Hilda, LCSW Phone Number: 12/26/2023, 12:52 PM  Clinical Narrative: Patient received the following bed offers:  [facility] [address] [place], [place] [address] [phone] Overall rating? Much below average  [facility] [address] [place], [place] [address] [phone] Overall rating ????? Much above average  [facility] [address] [place], [place] [address] [phone] Overall rating ?? Below average  CSW explained to patient that her [facility] copay for SNF will be $203/day after her rehab days from her previous SNF stay are used up and it will $380/day to private pay for [place]. CSW provided patient and daughter, Adel Aden, with the list of bed offers with Medicare star ratings.  Patient and daughter are agreeable to [facility], but would like to transfer to [facility] if possible. CSW confirmed bed with [facility]. CSW followed up with Daril Edge at [facility] regarding a possible transfer. Per Daril Edge, she can coordinate a possible transfer from [facility] but there is no guarantee of a transfer and daughter will need to transport the patient. CSW confirmed with patient that her daughter can transport her.  CSW started insurance authorization on [employer] portal, which is pending. Reference ID# is: [id number]. TOC awaiting insurance authorization.  Expected Discharge Plan: Skilled Nursing Facility Barriers to Discharge: Continued Medical Work up, English as a second language teacher, SNF Pending bed offer  Expected Discharge Plan and Services In-house Referral: Clinical Social Work Post Acute Care Choice: Skilled Nursing Facility Living arrangements for the past 2 months: Single Family Home             DME  Arranged: N/A DME Agency: NA  Social Determinants of Health (SDOH) Interventions SDOH Screenings   Food Insecurity: No Food Insecurity (12/17/2023)  Housing: Low Risk  (12/17/2023)  Transportation Needs: No Transportation Needs (12/17/2023)  Utilities: Not At Risk (12/17/2023)  Alcohol Screen: Low Risk  (10/13/2021)  Depression (PHQ2-9): Low Risk  (09/14/2023)  Financial Resource Strain: Medium Risk (10/13/2021)  Physical Activity: Insufficiently Active (10/13/2021)  Social Connections: Moderately Integrated (12/17/2023)  Stress: No Stress Concern Present (10/13/2021)  Tobacco Use: Medium Risk (12/17/2023)   Readmission Risk Interventions    11/12/2023   10:09 AM  Readmission Risk Prevention Plan  Transportation Screening Complete  PCP or Specialist Appt within 3-5 Days Complete  HRI or Home Care Consult Complete  Social Work Consult for Recovery Care Planning/Counseling Complete  Palliative Care Screening Not Applicable  Medication Review Oceanographer) Complete

## 2023-12-26 NOTE — Plan of Care (Signed)

## 2023-12-26 NOTE — Progress Notes (Signed)
 PROGRESS NOTE    Heidi Stark  NWG:[id number] DOB: 1941/05/29 DOA: 12/16/2023 PCP: Estill Hemming, DO     Brief Narrative:  Heidi Stark is a 83 y.o. female with a history of morbid obesity, diabetes mellitus type 2, hypertension, hyperlipidemia, obstructive sleep apnea on CPAP, congestive heart failure, atrial fibrillation, ventricular tachycardia status post ICD placement, CKD, hypothyroidism.  Patient presented secondary to right elbow swelling and pain with evidence of right elbow cellulitis.  Empiric antibiotics started.  Orthopedic surgery was consulted secondary to right elbow swelling with recommendation for no surgical intervention.  New events last 24 hours / Subjective: Patient doing well, right elbow feels better.  Awaiting SNF placement.  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  Patient has a history of direct injury, with wound present over right elbow. Patient was managed outpatient with cephalexin , however does not appear to have been on it long enough to consider failed treatment. Patient started empirically on Vancomycin , Cefepime  and Flagyl , transitioned to Cefepime . Orthopedic surgery consulted and per their assessment, possibly related to patient pushing herself up in a recliner frequently. Overall, cellulitis appears to be improving. Cefepime  transitioned to Cefazolin  IV. Doxycycline   added. CT of right elbow is reassuring. Orthopedic surgery reevaluated and still recommend no surgical management. Cefadroxil and doxycycline ; will likely treat for 10-14 days   Olecranon bursitis Noted. Orthopedic surgery evaluated; recommendation for no aspiration or I&D.   Chronic HFpEF Noted. LVEF of 70-75% on Transthoracic Echocardiogram obtained this admission.  Lasix , Farxiga , Entresto    Paroxysmal atrial fibrillation/flutter Cardiology consulted. Rate controlled. Cardiology recommendations: amiodarone , mexiletine, Toprol -XL, anticoagulation, possible outpatient DCCV   History of ventricular tachycardia S/p AICD. Amiodarone  and mexiletine   Primary hypertension Mostly well controlled. Toprol  XL   PAD Noted history. On Eliquis  for atrial fibrillation.   Hyperlipidemia Vascepa    Diabetes mellitus type 2 Well controlled based on hemoglobin A1C of 6.5%.  Sliding scale insulin    AKI on CKD stage IIIa Baseline creatinine of about 1. Creatinine peaked to 1.66. Improved.   Hypothyroidism Synthroid     Anxiety Depression Zoloft    Cough Some mild yellow sputum production. Afebrile. No dyspnea. Chest x-ray obtained and shows no evidence of cardiopulmonary disease. Azithromycin  ordered and patient's symptoms have improved. Complete Azithromycin  500 mg x3 days total duration   OSA on CPAP CPAP at bedtime   Morbid obesity, class III Estimated body mass index is 41.03 kg/m as calculated from the following:   Height as of this encounter: 5' 6 (1.676 m).   Weight as of this encounter: 115.3 kg.     DVT prophylaxis:   apixaban  (ELIQUIS ) tablet 5 mg  Code Status: Full code Family Communication: None at bedside Disposition Plan: SNF Status is: Inpatient Remains inpatient appropriate because: Placement pending    Antimicrobials:  Anti-infectives (From admission, onward)    Start     Dose/Rate Route Frequency Ordered  Stop   12/24/23 1130  azithromycin  (ZITHROMAX )  tablet 500 mg        500 mg Oral Daily 12/24/23 1031 12/26/23 0915   12/23/23 1215  cefadroxil (DURICEF) capsule 500 mg        500 mg Oral 2 times daily 12/23/23 1121     12/20/23 1600  doxycycline  (VIBRA -TABS) tablet 100 mg        100 mg Oral Every 12 hours 12/20/23 1510     12/18/23 1600  vancomycin  (VANCOREADY) IVPB 1750 mg/350 mL  Status:  Discontinued        1,750 mg 175 mL/hr over 120 Minutes Intravenous Every 36 hours 12/17/23 0522 12/18/23 0809   12/18/23 1400  ceFAZolin  (ANCEF ) IVPB 2g/100 mL premix  Status:  Discontinued        2 g 200 mL/hr over 30 Minutes Intravenous Every 8 hours 12/18/23 0815 12/23/23 1121   12/17/23 1500  ceFEPIme  (MAXIPIME ) 2 g in sodium chloride  0.9 % 100 mL IVPB  Status:  Discontinued        2 g 200 mL/hr over 30 Minutes Intravenous Every 12 hours 12/17/23 0510 12/18/23 0809   12/17/23 0615  vancomycin  (VANCOCIN ) IVPB 1000 mg/200 mL premix        1,000 mg 200 mL/hr over 60 Minutes Intravenous  Once 12/17/23 0521 12/17/23 0650   12/17/23 0230  ceFEPIme  (MAXIPIME ) 2 g in sodium chloride  0.9 % 100 mL IVPB        2 g 200 mL/hr over 30 Minutes Intravenous  Once 12/17/23 0229 12/17/23 0400   12/17/23 0230  metroNIDAZOLE  (FLAGYL ) IVPB 500 mg        500 mg 100 mL/hr over 60 Minutes Intravenous  Once 12/17/23 0229 12/17/23 0400   12/17/23 0230  vancomycin  (VANCOCIN ) IVPB 1000 mg/200 mL premix        1,000 mg 200 mL/hr over 60 Minutes Intravenous  Once 12/17/23 0229 12/17/23 0359        Objective: Vitals:   12/25/23 1308 12/25/23 2142 12/26/23 0546 12/26/23 1127  BP: 119/81 106/64 112/64 124/71  Pulse: 68 66 (!) 57 75  Resp: 15 20 12 20   Temp: 98.2 F (36.8 C) 97.6 F (36.4 C) 97.6 F (36.4 C) 97.8 F (36.6 C)  TempSrc: Oral Oral Oral Oral  SpO2: 95% 96% 97% 97%  Weight:      Height:        Intake/Output Summary (Last 24 hours) at 12/26/2023 1500 Last data filed at 12/26/2023 1159 Gross per 24 hour  Intake 720 ml  Output 1625 ml  Net -905  ml   Filed Weights   12/17/23 0505  Weight: 115.3 kg    Examination:  General exam: Appears calm and comfortable  Respiratory system: Clear to auscultation. Respiratory effort normal. No respiratory distress. No conversational dyspnea.  Cardiovascular system: S1 & S2 heard, RRR. No murmurs. No pedal edema. Gastrointestinal system: Abdomen is nondistended, soft and nontender. Normal bowel sounds heard. Central nervous system: Alert and oriented. No focal neurological deficits. Speech clear.  Extremities: Right elbow with erythema, nontender to palpation Psychiatry: Judgement and insight appear normal. Mood & affect appropriate.   Data Reviewed: I have personally reviewed following labs and imaging studies  CBC: Recent Labs  Lab 12/20/23 1033  WBC 10.9*  HGB 14.2  HCT 46.4*  MCV 100.0  PLT 203   Basic Metabolic Panel: Recent Labs  Lab 12/20/23 1033  NA 133*  K 4.9  CL 100  CO2 25  GLUCOSE 114*  BUN 16  CREATININE 0.80  CALCIUM  8.2*   GFR: Estimated Creatinine Clearance: 71.1 mL/min (by C-G formula based on SCr of 0.8 mg/dL). Liver Function Tests: No results for input(s): AST, ALT, ALKPHOS, BILITOT, PROT, ALBUMIN in the last 168 hours. No results for input(s): LIPASE, AMYLASE in the last 168 hours. No results for input(s): AMMONIA in the last 168 hours. Coagulation Profile: No results for input(s): INR, PROTIME in the last 168 hours. Cardiac Enzymes: No results for input(s): CKTOTAL, CKMB, CKMBINDEX, TROPONINI in the last 168 hours. BNP (last 3 results) No results for input(s): PROBNP in the last 8760 hours. HbA1C: No results for input(s): HGBA1C in the last 72 hours. CBG: Recent Labs  Lab 12/25/23 1119 12/25/23 1642 12/25/23 2150 12/26/23 0718 12/26/23 1124  GLUCAP 116* 91 85 122* 163*   Lipid Profile: No results for input(s): CHOL, HDL, LDLCALC, TRIG, CHOLHDL, LDLDIRECT in the last 72 hours. Thyroid   Function Tests: No results for input(s): TSH, T4TOTAL, FREET4, T3FREE, THYROIDAB in the last 72 hours. Anemia Panel: No results for input(s): VITAMINB12, FOLATE, FERRITIN, TIBC, IRON, RETICCTPCT in the last 72 hours. Sepsis Labs: No results for input(s): PROCALCITON, LATICACIDVEN in the last 168 hours.  Recent Results (from the past 240 hours)  Blood Culture (routine x 2)     Status: None   Collection Time: 12/16/23 11:51 PM   Specimen: BLOOD  Result Value Ref Range Status   Specimen Description   Final    BLOOD BLOOD LEFT ARM hand Performed at [facility], [address] [place], [place] [address]    Special Requests   Final    BOTTLES DRAWN AEROBIC AND ANAEROBIC Blood Culture results may not be optimal due to an inadequate volume of blood received in culture bottles Performed at [facility], [address], [place], [place] [address]    Culture   Final    NO GROWTH 5 DAYS Performed at [facility], [address] [place], [place] [address]    Report Status 12/22/2023 FINAL  Final  Blood Culture (routine x 2)     Status: None   Collection Time: 12/16/23 11:51 PM   Specimen: BLOOD  Result Value Ref Range Status   Specimen Description   Final    BLOOD BLOOD LEFT ARM Performed at [facility], [address], [place], [place] [address]    Special Requests   Final    BOTTLES DRAWN AEROBIC AND ANAEROBIC Blood Culture results may not be optimal due to an inadequate volume of blood received in culture bottles Performed at [facility], [address] [place], [place] [address]    Culture   Final    NO GROWTH 5 DAYS Performed at [facility], [address] [place], [place] [address]    Report Status 12/22/2023 FINAL  Final      Radiology Studies: No results found.    Scheduled Meds:  acetaminophen   1,000 mg Oral TID   amiodarone   200 mg Oral Daily    apixaban   5 mg Oral BID   cefadroxil  500 mg Oral BID   dapagliflozin  propanediol  10 mg Oral Daily   doxycycline   100 mg Oral Q12H   fenofibrate   160 mg Oral Daily   furosemide   40 mg Oral BID   icosapent  Ethyl  2 g Oral BID   insulin  aspart  0-9 Units Subcutaneous TID WC   insulin  aspart protamine -  aspart  10 Units Subcutaneous BID WC   levothyroxine   50 mcg Oral Daily   metoprolol  succinate  25 mg Oral Daily   mexiletine  300 mg Oral BID   polyethylene glycol  17 g Oral BID   pravastatin   80 mg Oral q1800   sacubitril -valsartan   1 tablet Oral BID   sertraline   25 mg Oral Daily   Continuous Infusions:   LOS: 8 days   Time spent: 25 minutes   Daren Eck, DO [facility] 12/26/2023, 3:00 PM   Available via Epic secure chat 7am-7pm After these hours, please refer to coverage provider listed on [email or web address]

## 2023-12-27 DIAGNOSIS — I4819 Other persistent atrial fibrillation: Secondary | ICD-10-CM | POA: Diagnosis not present

## 2023-12-27 DIAGNOSIS — Z7401 Bed confinement status: Secondary | ICD-10-CM | POA: Diagnosis not present

## 2023-12-27 DIAGNOSIS — I509 Heart failure, unspecified: Secondary | ICD-10-CM | POA: Diagnosis not present

## 2023-12-27 DIAGNOSIS — M25421 Effusion, right elbow: Secondary | ICD-10-CM | POA: Diagnosis not present

## 2023-12-27 DIAGNOSIS — R2681 Unsteadiness on feet: Secondary | ICD-10-CM | POA: Diagnosis not present

## 2023-12-27 DIAGNOSIS — I130 Hypertensive heart and chronic kidney disease with heart failure and stage 1 through stage 4 chronic kidney disease, or unspecified chronic kidney disease: Secondary | ICD-10-CM | POA: Diagnosis not present

## 2023-12-27 DIAGNOSIS — Z743 Need for continuous supervision: Secondary | ICD-10-CM | POA: Diagnosis not present

## 2023-12-27 DIAGNOSIS — M6259 Muscle wasting and atrophy, not elsewhere classified, multiple sites: Secondary | ICD-10-CM | POA: Diagnosis not present

## 2023-12-27 DIAGNOSIS — L03113 Cellulitis of right upper limb: Secondary | ICD-10-CM | POA: Diagnosis not present

## 2023-12-27 DIAGNOSIS — E1121 Type 2 diabetes mellitus with diabetic nephropathy: Secondary | ICD-10-CM | POA: Diagnosis not present

## 2023-12-27 DIAGNOSIS — M25521 Pain in right elbow: Secondary | ICD-10-CM | POA: Diagnosis not present

## 2023-12-27 DIAGNOSIS — J449 Chronic obstructive pulmonary disease, unspecified: Secondary | ICD-10-CM | POA: Diagnosis not present

## 2023-12-27 DIAGNOSIS — I480 Paroxysmal atrial fibrillation: Secondary | ICD-10-CM | POA: Diagnosis not present

## 2023-12-27 DIAGNOSIS — M6281 Muscle weakness (generalized): Secondary | ICD-10-CM | POA: Diagnosis not present

## 2023-12-27 DIAGNOSIS — M7020 Olecranon bursitis, unspecified elbow: Secondary | ICD-10-CM | POA: Diagnosis not present

## 2023-12-27 DIAGNOSIS — M7021 Olecranon bursitis, right elbow: Secondary | ICD-10-CM | POA: Diagnosis not present

## 2023-12-27 LAB — GLUCOSE, CAPILLARY
Glucose-Capillary: 113 mg/dL — ABNORMAL HIGH (ref 70–99)
Glucose-Capillary: 120 mg/dL — ABNORMAL HIGH (ref 70–99)

## 2023-12-27 MED ORDER — CEFADROXIL 500 MG PO CAPS: 500.0000 mg | ORAL_CAPSULE | Freq: Two times a day (BID) | ORAL | Status: DC

## 2023-12-27 MED ORDER — HUMALOG MIX 75/25 KWIKPEN (75-25) 100 UNIT/ML ~~LOC~~ SUPN: PEN_INJECTOR | SUBCUTANEOUS | Status: DC

## 2023-12-27 MED ORDER — DOXYCYCLINE HYCLATE 100 MG PO TABS: 100.0000 mg | ORAL_TABLET | Freq: Two times a day (BID) | ORAL | Status: DC

## 2023-12-27 NOTE — Care Management Important Message (Signed)
 Important Message  Patient Details IM Letter given. Name: Heidi Stark MRN: [id number] Date of Birth: 1941/04/04   Important Message Given:  Yes - Medicare IM     Heidi Stark 12/27/2023, 12:21 PM

## 2023-12-27 NOTE — Discharge Summary (Signed)
 Physician Discharge Summary  Heidi Stark:[id number] DOB: 09/10/1940 DOA: 12/16/2023  PCP: Estill Hemming, DO  Admit date: 12/16/2023 Discharge date: 12/27/2023  Disposition:  SNF   Recommendations for Outpatient Follow-up:  Follow up with PCP 7/8  Follow up with Cardiology 6/17  Discharge Condition: Stable CODE STATUS: Full  Diet recommendation:  Diet Orders (From admission, onward)     Start     Ordered   12/27/23 0000  Diet - low sodium heart healthy        12/27/23 1101   12/17/23 0417  Diet heart healthy/carb modified Room service appropriate? Yes; Fluid consistency: Thin; Fluid restriction: 1500 mL Fluid  Diet effective now       Question Answer Comment  Diet-HS Snack? Nothing   Room service appropriate? Yes   Fluid consistency: Thin   Fluid restriction: 1500 mL Fluid      12/17/23 0416           Brief/Interim Summary: Heidi Stark is a 83 y.o. female with a history of morbid obesity, diabetes mellitus type 2, hypertension, hyperlipidemia, obstructive sleep apnea on CPAP, congestive heart failure, atrial fibrillation, ventricular tachycardia status post ICD placement, CKD, hypothyroidism.  Patient presented secondary to right elbow swelling and pain with evidence of right elbow cellulitis.  Empiric antibiotics started.  Orthopedic surgery was consulted secondary to right elbow swelling with recommendation for no surgical intervention.  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  Patient has a history of direct injury, with wound present over right elbow. Patient was managed outpatient with cephalexin , however does not appear to have been on it long enough to consider failed treatment. Patient started empirically on Vancomycin , Cefepime  and Flagyl , transitioned to Cefepime . Orthopedic surgery consulted and per their assessment, possibly related to patient pushing herself up in a recliner frequently. Overall, cellulitis appears to be improving. Cefepime  transitioned to Cefazolin  IV. Doxycycline  added. CT of right elbow is reassuring. Orthopedic surgery reevaluated and still recommend no surgical management. Cefadroxil and doxycycline ; will likely treat for 10-14 days (last day 6/15)    Olecranon bursitis Noted. Orthopedic surgery evaluated; recommendation for no aspiration or I&D.   Chronic HFpEF Noted. LVEF of 70-75% on Transthoracic Echocardiogram obtained this admission.  Lasix , Farxiga , Entresto    Paroxysmal atrial fibrillation/flutter Cardiology consulted. Rate controlled. Cardiology recommendations: amiodarone , mexiletine, Toprol -XL, anticoagulation, possible outpatient DCCV   History of ventricular tachycardia S/p AICD. Amiodarone  and mexiletine   Primary hypertension Mostly well controlled. Toprol  XL   PAD Noted history. On Eliquis  for atrial fibrillation.   Hyperlipidemia Vascepa    Diabetes mellitus type 2 Well controlled based on hemoglobin A1C of 6.5%.  Sliding scale insulin    AKI on CKD stage IIIa Baseline creatinine of about 1. Creatinine peaked to 1.66. Improved.   Hypothyroidism Synthroid     Anxiety Depression Zoloft    Cough Some mild yellow sputum production. Afebrile. No dyspnea. Chest  x-ray obtained and shows no evidence of cardiopulmonary disease. Azithromycin  ordered and patient's symptoms have improved. Complete Azithromycin  500  mg x3 days total duration   OSA on CPAP CPAP at bedtime   Morbid obesity, class III Estimated body mass index is 41.03 kg/m as calculated from the following:   Height as of this encounter: 5' 6 (1.676 m).   Weight as of this encounter: 115.3 kg.    Discharge Instructions  Discharge Instructions     Diet - low sodium heart healthy   Complete by: As directed    Increase activity slowly   Complete by: As directed    No wound care   Complete by: As directed       Allergies as of 12/27/2023       Reactions   Neomycin-bacitracin Zn-polymyx Rash   Sulfa Antibiotics Diarrhea, Nausea And Vomiting   Ciprofloxacin  Hives, Itching, Rash   Pt doesn't remember having any reaction to cipro    Spironolactone  Rash        Medication List     STOP taking these medications    eplerenone  25 MG tablet Commonly known as: INSPRA        TAKE these medications    acetaminophen  500 MG tablet Commonly known as: TYLENOL  Take 500 mg by mouth every 6 (six) hours as needed for moderate pain (pain score 4-6) or headache.   amiodarone  200 MG tablet Commonly known as: PACERONE  TAKE 1 TABLET BY MOUTH EVERY DAY   BD Pen Needle Nano 2nd Gen 32G X 4 MM Misc Generic drug: Insulin  Pen Needle See admin instructions.   cefadroxil 500 MG capsule Commonly known as: DURICEF Take 1 capsule (500 mg total) by mouth 2 (two) times daily. What changed: when to take this   dapagliflozin  propanediol 10 MG Tabs tablet Commonly known as: Farxiga  TAKE 1 TABLET BY MOUTH EVERY DAY BEFORE BREAKFAST   doxycycline  100 MG tablet Commonly known as: VIBRA -TABS Take 1 tablet (100 mg total) by mouth every 12 (twelve) hours.   Eliquis  5 MG Tabs tablet Generic drug: apixaban  TAKE 1 TABLET BY MOUTH TWICE A DAY   Entresto  24-26 MG Generic drug: sacubitril -valsartan  Take 1 tablet by mouth 2 (two) times daily.   fenofibrate  145 MG tablet Commonly known as: TRICOR  TAKE 1 TABLET BY MOUTH EVERY DAY    furosemide  40 MG tablet Commonly known as: LASIX  Take 40 mg by mouth 2 (two) times daily. Take 2 tablets by mouth twice daily   gabapentin  300 MG capsule Commonly known as: NEURONTIN  Take 300 mg by mouth 2 (two) times daily as needed (pain).   HumaLOG  Mix 75/25 KwikPen (75-25) 100 UNIT/ML KwikPen Generic drug: Insulin  Lispro Prot & Lispro Take 10 units with morning meal and 10 units with evening meal What changed: additional instructions   levothyroxine  50 MCG tablet Commonly known as: SYNTHROID  Take 1 tablet (50 mcg total) by mouth daily.   metoprolol  succinate 25 MG 24 hr tablet Commonly known as: TOPROL -XL TAKE 1 TABLET BY MOUTH EVERYDAY AT BEDTIME   mexiletine 150 MG capsule Commonly known as: MEXITIL  TAKE 2 CAPSULES (300 MG TOTAL) BY MOUTH 2 (TWO) TIMES DAILY.   Mounjaro 2.5 MG/0.5ML Pen Generic drug: tirzepatide Inject 2.5 mg into the skin once a week. Thursdays   multivitamin with minerals Tabs tablet Take 1 tablet by mouth daily.   nicotine  21 mg/24hr patch Commonly known as: NICODERM CQ  - dosed in mg/24 hours Place 1 patch (21 mg total) onto the skin daily as  needed (Nicotine  withdrawal).   OneTouch Verio test strip Generic drug: glucose blood 1 each 3 (three) times daily.   Pitavastatin  Calcium  4 MG Tabs TAKE 1 TABLET BY MOUTH EVERY DAY   potassium chloride  10 MEQ CR capsule Commonly known as: MICRO-K  Take 10 mEq by mouth 2 (two) times daily.   sertraline  25 MG tablet Commonly known as: ZOLOFT  TAKE 1 TABLET (25 MG TOTAL) BY MOUTH DAILY.   Vascepa  1 g capsule Generic drug: icosapent  Ethyl TAKE 2 CAPSULES BY MOUTH TWICE A DAY        Contact information for follow-up providers     [facility], [facility] Follow up.   Specialty: Home Health Services Why: Gasper Karst will provide PT and OT in the home after discharge. Contact information: [address] [place] [address] [phone]              Contact information for  after-discharge care     Destination     [facility] .   Service: Skilled Nursing Contact information: [address] [place]  [address] [phone]                    Allergies  Allergen Reactions   Neomycin-Bacitracin Zn-Polymyx Rash   Sulfa Antibiotics Diarrhea and Nausea And Vomiting   Ciprofloxacin  Hives, Itching and Rash    Pt doesn't remember having any reaction to cipro    Spironolactone  Rash     Procedures/Studies: CT ELBOW RIGHT WO CONTRAST Result Date: 12/20/2023 CLINICAL DATA:  Olecranon bursitis, cellulitis. Worsening symptoms EXAM: CT OF THE UPPER RIGHT EXTREMITY WITHOUT CONTRAST TECHNIQUE: Multidetector CT imaging of the upper right extremity was performed according to the standard protocol. RADIATION DOSE REDUCTION: This exam was performed according to the departmental dose-optimization program which includes automated exposure control, adjustment of the mA and/or kV according to patient size and/or use of iterative reconstruction technique. COMPARISON:  None Available. FINDINGS: Bones/Joint/Cartilage No fracture or dislocation. No evidence of osteolysis. Tiny well corticated ossicle adjacent to the lateral humeral epicondyle may relate to prior trauma. Radiocapitellar and ulnotrochlear joints are anatomically aligned. Ligaments Ligaments are suboptimally evaluated by CT. Muscles and Tendons Muscles are normal. No muscle atrophy. No intramuscular fluid collection or hematoma. Soft tissue Fluid density within the subcutaneous tissues overlying the olecranon at the level of the olecranon bursa, measures approximately 5.1 x 2.8 x 1.4 cm, however, the borders of the olecranon bursa are difficult to delineate given surrounding subcutaneous edema and overlying cutaneous thickening extending along the dorsal elbow and visualized arm. No soft tissue gas. IMPRESSION: 1. Findings most compatible with olecranon bursitis with  surrounding cellulitis extending along the dorsal elbow and visualized arm. 2. No acute osseous abnormality. Electronically Signed   By: Mannie Seek M.D.   On: 12/20/2023 14:11   DG CHEST PORT 1 VIEW Result Date: 12/20/2023 CLINICAL DATA:  Productive cough. EXAM: PORTABLE CHEST 1 VIEW COMPARISON:  December 17, 2023. FINDINGS: Stable cardiomediastinal silhouette. Left-sided defibrillator is unchanged. Both lungs are clear. The visualized skeletal structures are unremarkable. IMPRESSION: No active disease. Electronically Signed   By: Rosalene Colon M.D.   On: 12/20/2023 11:33   ECHOCARDIOGRAM COMPLETE Result Date: 12/18/2023    ECHOCARDIOGRAM REPORT   Patient Name:   MACKENZEE BECVAR Date of Exam: 12/18/2023 Medical Rec #:  [id number]      Height:       66.0 in Accession #:    [id number]  Weight:       254.2 lb Date of Birth:  07/14/1941      BSA:          2.214 m Patient Age:    82 years       BP:           93/56 mmHg Patient Gender: F              HR:           101 bpm. Exam Location:  Inpatient Procedure: Intracardiac Opacification Agent, 2D Echo, Cardiac Doppler and Color            Doppler (Both Spectral and Color Flow Doppler were utilized during            procedure). Indications:    Abnormal ECG 794.31/ R94.31  History:        Patient has prior history of Echocardiogram examinations, most                 recent 02/22/2022. CHF, COPD; Risk Factors:Sleep Apnea, Former                 Smoker, Hypertension, Dyslipidemia and Diabetes.  Sonographer:    Kip Peon RDCS Referring Phys: [id number] [facility]  Sonographer Comments: Technically challenging study due to limited acoustic windows. Image acquisition challenging due to respiratory motion. IMPRESSIONS  1. Left ventricular ejection fraction, by estimation, is 70 to 75%. The left ventricle has hyperdynamic function. The left ventricle has no regional wall motion abnormalities. There is moderate concentric left ventricular hypertrophy. Left ventricular  diastolic function could not be evaluated.  2. Right ventricular systolic function was not well visualized. The right ventricular size is not well visualized. There is mildly elevated pulmonary artery systolic pressure. The estimated right ventricular systolic pressure is 37.1 mmHg.  3. Left atrial size was severely dilated.  4. The mitral valve is degenerative. No evidence of mitral valve regurgitation. No evidence of mitral stenosis. Moderate mitral annular calcification.  5. The aortic valve is tricuspid. There is mild calcification of the aortic valve. There is mild thickening of the aortic valve. Aortic valve regurgitation is not visualized. Aortic valve sclerosis/calcification is present, without any evidence of aortic stenosis.  6. There is borderline dilatation of the ascending aorta, measuring 38 mm.  7. The inferior vena cava is normal in size with greater than 50% respiratory variability, suggesting right atrial pressure of 3 mmHg. FINDINGS  Left Ventricle: Left ventricular ejection fraction, by estimation, is 70 to 75%. The left ventricle has hyperdynamic function. The left ventricle has no regional wall motion abnormalities. The left ventricular internal cavity size was normal in size. There is moderate concentric left ventricular hypertrophy. Left ventricular diastolic function could not be evaluated due to atrial fibrillation. Left ventricular diastolic function could not be evaluated. Right Ventricle: The right ventricular size is not well visualized. Right vetricular wall thickness was not well visualized. Right ventricular systolic function was not well visualized. There is mildly elevated pulmonary artery systolic pressure. The tricuspid regurgitant velocity is 2.35 m/s, and with an assumed right atrial pressure of 15 mmHg, the estimated right ventricular systolic pressure is 37.1 mmHg. Left Atrium: Left atrial size was severely dilated. Right Atrium: Right atrial size was not well visualized.  Pericardium: There is no evidence of pericardial effusion. Mitral Valve: The mitral valve is degenerative in appearance. Moderate mitral annular calcification. No evidence of mitral valve regurgitation. No evidence of mitral valve stenosis. Tricuspid Valve: The tricuspid  valve is normal in structure. Tricuspid valve regurgitation is mild. Aortic Valve: The aortic valve is tricuspid. There is mild calcification of the aortic valve. There is mild thickening of the aortic valve. Aortic valve regurgitation is not visualized. Aortic valve sclerosis/calcification is present, without any evidence of aortic stenosis. Pulmonic Valve: The pulmonic valve was grossly normal. Pulmonic valve regurgitation is not visualized. Aorta: The aortic root is normal in size and structure. There is borderline dilatation of the ascending aorta, measuring 38 mm. Venous: The inferior vena cava is normal in size with greater than 50% respiratory variability, suggesting right atrial pressure of 3 mmHg. IAS/Shunts: The interatrial septum was not well visualized. Additional Comments: A device lead is visualized in the right ventricle.  LEFT VENTRICLE PLAX 2D LVIDd:         5.40 cm   Diastology LVIDs:         3.60 cm   LV e' medial:    5.70 cm/s LV PW:         1.45 cm   LV E/e' medial:  21.2 LV IVS:        1.50 cm   LV e' lateral:   11.00 cm/s LVOT diam:     1.90 cm   LV E/e' lateral: 11.0 LV SV:         62 LV SV Index:   28 LVOT Area:     2.84 cm  RIGHT VENTRICLE            IVC RV S prime:     9.30 cm/s  IVC diam: 2.40 cm LEFT ATRIUM           Index LA diam:      5.30 cm 2.39 cm/m LA Vol (A2C): 60.1 ml 27.14 ml/m  AORTIC VALVE LVOT Vmax:   82.60 cm/s LVOT Vmean:  55.700 cm/s LVOT VTI:    0.218 m  AORTA Ao Root diam: 3.20 cm Ao Asc diam:  3.80 cm MITRAL VALVE                TRICUSPID VALVE MV Area (PHT): 3.60 cm     TR Peak grad:   22.1 mmHg MV Decel Time: 211 msec     TR Vmax:        235.00 cm/s MV E velocity: 121.00 cm/s MV A velocity: 40.50  cm/s   SHUNTS MV E/A ratio:  2.99         Systemic VTI:  0.22 m                             Systemic Diam: 1.90 cm Karyl Paget Croitoru MD Electronically signed by Luana Rumple MD Signature Date/Time: 12/18/2023/1:32:23 PM    Final    DG Chest Port 1 View Result Date: 12/17/2023 EXAM: 1 VIEW XRAY OF THE CHEST 12/17/2023 12:46:04 AM COMPARISON: 05/14/2023 CLINICAL HISTORY: Questionable sepsis - evaluate for abnormality. Pt comes from home via [facility] for generalized weakness. Pt had redness and swelling to R elbow, started on antibiotic today for it. FINDINGS: LUNGS AND PLEURA: No focal pulmonary opacity. No pulmonary edema. No pleural effusion. No pneumothorax. HEART AND MEDIASTINUM: Stable cardiomegaly. BONES AND SOFT TISSUES: No acute osseous abnormality. VASCULATURE: Thoracic aortic atherosclerosis. Left subclavian ICD in place. IMPRESSION: 1. No acute findings. 2. Stable cardiomegaly. Electronically signed by: Zadie Herter MD 12/17/2023 12:48 AM EDT RP Workstation: [id number]      Discharge Exam: Vitals:   12/26/23 1127  12/26/23 2119  BP: 124/71 125/70  Pulse: 75   Resp: 20 18  Temp: 97.8 F (36.6 C) 97.8 F (36.6 C)  SpO2: 97% 98%    General: Pt is alert, awake, not in acute distress Cardiovascular:  S1/S2 +, no edema Respiratory: CTA bilaterally, no wheezing, no rhonchi, no respiratory distress, no conversational dyspnea  Abdominal: Soft, NT, ND, bowel sounds + Extremities: Right elbow with deep red/purple, sequela from improving cellulitis  Psych: Normal mood and affect, stable judgement and insight     The results of significant diagnostics from this hospitalization (including imaging, microbiology, ancillary and laboratory) are listed below for reference.     Microbiology: No results found for this or any previous visit (from the past 240 hours).   Labs: BNP (last 3 results) Recent Labs    05/13/23 0246 10/11/23 1548  BNP 274.7* 166.6*   Basic Metabolic Panel: No  results for input(s): NA, K, CL, CO2, GLUCOSE, BUN, CREATININE, CALCIUM , MG, PHOS in the last 168 hours. Liver Function Tests: No results for input(s): AST, ALT, ALKPHOS, BILITOT, PROT, ALBUMIN in the last 168 hours. No results for input(s): LIPASE, AMYLASE in the last 168 hours. No results for input(s): AMMONIA in the last 168 hours. CBC: No results for input(s): WBC, NEUTROABS, HGB, HCT, MCV, PLT in the last 168 hours. Cardiac Enzymes: No results for input(s): CKTOTAL, CKMB, CKMBINDEX, TROPONINI in the last 168 hours. BNP: Invalid input(s): POCBNP CBG: Recent Labs  Lab 12/26/23 0718 12/26/23 1124 12/26/23 1605 12/26/23 2116 12/27/23 0714  GLUCAP 122* 163* 89 93 113*   D-Dimer No results for input(s): DDIMER in the last 72 hours. Hgb A1c No results for input(s): HGBA1C in the last 72 hours. Lipid Profile No results for input(s): CHOL, HDL, LDLCALC, TRIG, CHOLHDL, LDLDIRECT in the last 72 hours. Thyroid  function studies No results for input(s): TSH, T4TOTAL, T3FREE, THYROIDAB in the last 72 hours.  Invalid input(s): FREET3 Anemia work up No results for input(s): VITAMINB12, FOLATE, FERRITIN, TIBC, IRON, RETICCTPCT in the last 72 hours. Urinalysis    Component Value Date/Time   COLORURINE YELLOW 12/18/2023 0812   APPEARANCEUR CLEAR 12/18/2023 0812   LABSPEC 1.014 12/18/2023 0812   PHURINE 5.0 12/18/2023 0812   GLUCOSEU >=500 (A) 12/18/2023 0812   GLUCOSEU Negative 11/30/2011 0804   HGBUR NEGATIVE 12/18/2023 0812   BILIRUBINUR NEGATIVE 12/18/2023 0812   BILIRUBINUR neg 05/18/2016 0932   KETONESUR NEGATIVE 12/18/2023 0812   PROTEINUR NEGATIVE 12/18/2023 0812   UROBILINOGEN negative 05/18/2016 0932   UROBILINOGEN 1.0 11/23/2014 2250   NITRITE NEGATIVE 12/18/2023 0812   LEUKOCYTESUR NEGATIVE 12/18/2023 0812   Sepsis Labs No results for input(s): WBC in the last 168  hours.  Invalid input(s): PROCALCITONIN, LACTICIDVEN Microbiology No results found for this or any previous visit (from the past 240 hours).   Patient was seen and examined on the day of discharge and was found to be in stable condition. Time coordinating discharge: 40 minutes including assessment and coordination of care, as well as examination of the patient.   SIGNED:  Daren Eck, DO [facility] 12/27/2023, 11:01 AM

## 2023-12-27 NOTE — TOC Transition Note (Signed)
 Transition of Care [facility]) - Discharge Note  Patient Details  Name: Heidi Stark MRN: [id number] Date of Birth: 10-Mar-1941  Transition of Care [facility]) CM/SW Contact:  Zenon Hilda, LCSW Phone Number: 12/27/2023, 11:36 AM  Clinical Narrative: Patient is approved for SNF. Plan auth ID # is: [id number]. Patient has been approved for 12/27/2023-12/31/2023. Patient will go to room 508 and the number for report is [phone]. Discharge summary, discharge orders, and SNF transfer report faxed to facility in hub. Medical necessity form done; PTAR scheduled. Discharge packet completed. Daughter notified of insurance approval. RN updated. TOC signing off.  Final next level of care: Skilled Nursing Facility Barriers to Discharge: Barriers Resolved  Patient Goals and CMS Choice Patient states their goals for this hospitalization and ongoing recovery are:: Go to rehab CMS Medicare.gov Compare Post Acute Care list provided to:: Patient Choice offered to / list presented to : Patient  Discharge Placement Existing PASRR number confirmed : 12/25/23          Patient chooses bed at: [facility] Patient to be transferred to facility by: PTAR Name of family member notified: Adel Aden (daughter) Patient and family notified of of transfer: 12/27/23  Discharge Plan and Services Additional resources added to the After Visit Summary for   In-house Referral: Clinical Social Work Post Acute Care Choice: Skilled Nursing Facility          DME Arranged: N/A DME Agency: NA  Social Drivers of Health (SDOH) Interventions SDOH Screenings   Food Insecurity: No Food Insecurity (12/17/2023)  Housing: Low Risk  (12/17/2023)  Transportation Needs: No Transportation Needs (12/17/2023)  Utilities: Not At Risk (12/17/2023)  Alcohol Screen: Low Risk  (10/13/2021)  Depression (PHQ2-9): Low Risk  (09/14/2023)  Financial Resource Strain: Medium Risk (10/13/2021)  Physical Activity: Insufficiently Active (10/13/2021)  Social  Connections: Moderately Integrated (12/17/2023)  Stress: No Stress Concern Present (10/13/2021)  Tobacco Use: Medium Risk (12/17/2023)   Readmission Risk Interventions    11/12/2023   10:09 AM  Readmission Risk Prevention Plan  Transportation Screening Complete  PCP or Specialist Appt within 3-5 Days Complete  HRI or Home Care Consult Complete  Social Work Consult for Recovery Care Planning/Counseling Complete  Palliative Care Screening Not Applicable  Medication Review Oceanographer) Complete

## 2023-12-28 DIAGNOSIS — I509 Heart failure, unspecified: Secondary | ICD-10-CM | POA: Diagnosis not present

## 2023-12-28 DIAGNOSIS — L03113 Cellulitis of right upper limb: Secondary | ICD-10-CM | POA: Diagnosis not present

## 2023-12-28 DIAGNOSIS — R2681 Unsteadiness on feet: Secondary | ICD-10-CM | POA: Diagnosis not present

## 2023-12-28 DIAGNOSIS — I4819 Other persistent atrial fibrillation: Secondary | ICD-10-CM | POA: Diagnosis not present

## 2023-12-28 DIAGNOSIS — M6281 Muscle weakness (generalized): Secondary | ICD-10-CM | POA: Diagnosis not present

## 2023-12-31 DIAGNOSIS — L03113 Cellulitis of right upper limb: Secondary | ICD-10-CM | POA: Diagnosis not present

## 2023-12-31 DIAGNOSIS — I480 Paroxysmal atrial fibrillation: Secondary | ICD-10-CM | POA: Diagnosis not present

## 2023-12-31 DIAGNOSIS — M7021 Olecranon bursitis, right elbow: Secondary | ICD-10-CM | POA: Diagnosis not present

## 2023-12-31 DIAGNOSIS — I130 Hypertensive heart and chronic kidney disease with heart failure and stage 1 through stage 4 chronic kidney disease, or unspecified chronic kidney disease: Secondary | ICD-10-CM | POA: Diagnosis not present

## 2024-01-01 ENCOUNTER — Ambulatory Visit (INDEPENDENT_AMBULATORY_CARE_PROVIDER_SITE_OTHER)

## 2024-01-01 ENCOUNTER — Encounter (HOSPITAL_COMMUNITY)

## 2024-01-01 DIAGNOSIS — I472 Ventricular tachycardia, unspecified: Secondary | ICD-10-CM

## 2024-01-01 LAB — CUP PACEART REMOTE DEVICE CHECK
Battery Remaining Longevity: 101 mo
Battery Voltage: 3.01 V
Brady Statistic RV Percent Paced: 0.73 %
Date Time Interrogation Session: 20250616223005
HighPow Impedance: 68 Ohm
Implantable Lead Connection Status: 753985
Implantable Lead Implant Date: 20220302
Implantable Lead Location: 753860
Implantable Pulse Generator Implant Date: 20220302
Lead Channel Impedance Value: 2128 Ohm
Lead Channel Impedance Value: 2242 Ohm
Lead Channel Pacing Threshold Amplitude: 2.375 V
Lead Channel Pacing Threshold Pulse Width: 0.4 ms
Lead Channel Sensing Intrinsic Amplitude: 7.375 mV
Lead Channel Sensing Intrinsic Amplitude: 7.375 mV
Lead Channel Setting Pacing Amplitude: 3 V
Lead Channel Setting Pacing Pulse Width: 1 ms
Lead Channel Setting Sensing Sensitivity: 0.3 mV
Zone Setting Status: 755011
Zone Setting Status: 755011

## 2024-01-03 ENCOUNTER — Ambulatory Visit: Payer: Self-pay | Admitting: Cardiology

## 2024-01-05 DIAGNOSIS — M19032 Primary osteoarthritis, left wrist: Secondary | ICD-10-CM | POA: Diagnosis not present

## 2024-01-05 DIAGNOSIS — M19042 Primary osteoarthritis, left hand: Secondary | ICD-10-CM | POA: Diagnosis not present

## 2024-01-05 DIAGNOSIS — I4819 Other persistent atrial fibrillation: Secondary | ICD-10-CM | POA: Diagnosis not present

## 2024-01-05 DIAGNOSIS — I130 Hypertensive heart and chronic kidney disease with heart failure and stage 1 through stage 4 chronic kidney disease, or unspecified chronic kidney disease: Secondary | ICD-10-CM | POA: Diagnosis not present

## 2024-01-05 DIAGNOSIS — M19041 Primary osteoarthritis, right hand: Secondary | ICD-10-CM | POA: Diagnosis not present

## 2024-01-05 DIAGNOSIS — G4733 Obstructive sleep apnea (adult) (pediatric): Secondary | ICD-10-CM | POA: Diagnosis not present

## 2024-01-05 DIAGNOSIS — I4892 Unspecified atrial flutter: Secondary | ICD-10-CM | POA: Diagnosis not present

## 2024-01-05 DIAGNOSIS — J449 Chronic obstructive pulmonary disease, unspecified: Secondary | ICD-10-CM | POA: Diagnosis not present

## 2024-01-05 DIAGNOSIS — Z794 Long term (current) use of insulin: Secondary | ICD-10-CM | POA: Diagnosis not present

## 2024-01-05 DIAGNOSIS — G8929 Other chronic pain: Secondary | ICD-10-CM | POA: Diagnosis not present

## 2024-01-05 DIAGNOSIS — N183 Chronic kidney disease, stage 3 unspecified: Secondary | ICD-10-CM | POA: Diagnosis not present

## 2024-01-05 DIAGNOSIS — M19031 Primary osteoarthritis, right wrist: Secondary | ICD-10-CM | POA: Diagnosis not present

## 2024-01-05 DIAGNOSIS — Z7901 Long term (current) use of anticoagulants: Secondary | ICD-10-CM | POA: Diagnosis not present

## 2024-01-05 DIAGNOSIS — E1122 Type 2 diabetes mellitus with diabetic chronic kidney disease: Secondary | ICD-10-CM | POA: Diagnosis not present

## 2024-01-05 DIAGNOSIS — Z7984 Long term (current) use of oral hypoglycemic drugs: Secondary | ICD-10-CM | POA: Diagnosis not present

## 2024-01-05 DIAGNOSIS — E039 Hypothyroidism, unspecified: Secondary | ICD-10-CM | POA: Diagnosis not present

## 2024-01-05 DIAGNOSIS — M25461 Effusion, right knee: Secondary | ICD-10-CM | POA: Diagnosis not present

## 2024-01-05 DIAGNOSIS — M545 Low back pain, unspecified: Secondary | ICD-10-CM | POA: Diagnosis not present

## 2024-01-05 DIAGNOSIS — E1151 Type 2 diabetes mellitus with diabetic peripheral angiopathy without gangrene: Secondary | ICD-10-CM | POA: Diagnosis not present

## 2024-01-05 DIAGNOSIS — E785 Hyperlipidemia, unspecified: Secondary | ICD-10-CM | POA: Diagnosis not present

## 2024-01-05 DIAGNOSIS — I472 Ventricular tachycardia, unspecified: Secondary | ICD-10-CM | POA: Diagnosis not present

## 2024-01-05 DIAGNOSIS — I5042 Chronic combined systolic (congestive) and diastolic (congestive) heart failure: Secondary | ICD-10-CM | POA: Diagnosis not present

## 2024-01-07 ENCOUNTER — Other Ambulatory Visit (HOSPITAL_COMMUNITY): Payer: Self-pay | Admitting: Cardiology

## 2024-01-07 ENCOUNTER — Telehealth: Payer: Self-pay

## 2024-01-07 MED ORDER — POTASSIUM CHLORIDE ER 10 MEQ PO CPCR: 10.0000 meq | ORAL_CAPSULE | Freq: Two times a day (BID) | ORAL | 3 refills | Status: DC

## 2024-01-07 NOTE — Telephone Encounter (Signed)
 Pt needs a hosp f/u please

## 2024-01-07 NOTE — Telephone Encounter (Signed)
 Initial Comment Caller states she is really nauseous and would like to request medication to be called in to her pharmacy. Translation No Nurse Assessment Nurse: Darleen, RN, [place] Date/Time (Eastern Time): 01/06/2024 3:11:52 PM Confirm and document reason for call. If symptomatic, describe symptoms. ---Caller states she is getting very nauseated right after eating. She states she was hospitalized (cellulitis) and was in rehab for 4 days and its been since then that she has had the nausea. Does the patient have any new or worsening symptoms? ---Yes Will a triage be completed? ---Yes Related visit to physician within the last 2 weeks? ---No Does the PT have any chronic conditions? (i.e. diabetes, asthma, this includes High risk factors for pregnancy, etc.) ---Yes List chronic conditions. ---Diabetes type 2, HTN, CHF Is this a behavioral health or substance abuse call? ---No Guidelines Guideline Title Affirmed Question Affirmed Notes Nurse Date/Time (Eastern Time) Nausea Taking prescription medication that could cause nausea (e.g., narcotics/opiates, antibiotics, OCPs, many others) Darleen, RN, Carlee 01/06/2024 3:14:34 PM PLEASE NOTE: All timestamps contained within this report are represented as [place] Standard Time. CONFIDENTIALTY NOTICE: This fax transmission is intended only for the addressee. It contains information that is legally privileged, confidential or otherwise protected from use or disclosure. If you are not the intended recipient, you are strictly prohibited from reviewing, disclosing, copying using or disseminating any of this information or taking any action in reliance on or regarding this information. If you have received this fax in error, please notify us  immediately by telephone so that we can arrange for its return to us . Phone: [phone], Toll-Free: [phone], Fax: [phone] [email or web address] [birth year] Page: 1 of2 CallId: [id number] Disp. Time  [place]itus Time) Disposition Final User 01/06/2024 3:20:35 PM Call PCP within 24 Hours Yes Darleen RN, [place] Final Disposition 01/06/2024 3:20:35 PM Call PCP within 24 Hours Yes Darleen, RN, [place] Caller Disagree/Comply Comply Caller Understands Yes PreDisposition Call Doctor Care Advice Given Per Guideline CALL PCP WITHIN 24 HOURS: * You need to discuss this with your doctor (or NP/PA) within the next 24 hours. CALL BACK IF: * You become worse CARE ADVICE given per Nausea (Adult) guideline. SOLID FOODS: * Slowly return to a normal diet. * Start with saltine crackers, white bread, rice, mashed potatoes, cereal, apple sauce etc. CLEAR FLUIDS: * Take clear fluids in small amounts until the nausea is resolved for 8 hours: Comments User: [place] Darleen, RN Date/Time Titus Time): 01/06/2024 3:14:27 PM pacemaker

## 2024-01-07 NOTE — Telephone Encounter (Signed)
 Pt already scheduled for HFU.

## 2024-01-08 ENCOUNTER — Encounter (HOSPITAL_COMMUNITY)

## 2024-01-08 NOTE — Progress Notes (Incomplete)
 Advanced Heart Failure Clinic Note  PCP:  [name], DO  HF Cardiologist: Dr. Rolan  Reason for Visit: Heart Failure Follow-up HPI: Heidi Stark is a 83 y.o. 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   She has left leg pain with ambulation after about 100 feet, LE dopplers 5/15 showed occluded left external iliac artery. This has been present ever since she had left iliac damage with a prior back surgery.     Admitted to [facility] 1/18 with AF/RVR and acute systolic CHF.  EF was 30-35%, down from 50-55% in 2015. Underwent DCCV back to NSR.  She subsequently had atrial fibrillation ablation in 4/18.  She went back into AF in 5/19 and had DCCV back to NSR.   Echo 6/19 with EF 55%, mild LVH, moderate diastolic dysfunction, normal RV size and systolic function.    Admitted for recurrent AF 10/19 to start Tikosyn . She was cardioverted back to NSR. Tikosyn  stopped in 2/22 d/t VT and presyncope. Started on amio.  EF at the time 50-55%, but cMRI showed EF 40% with subendocardial scar in the basal to mid inferolateral wall, RV EF was 38%. LHC without obstructive disease.   Readmitted 2/22 with presyncopal events. No VT but continued atypical AF. S/p Medtronic ICD 3/22. Discharge weight 256 lbs.   ERAF at follow up s/p another DCCV 3/22.  Multiple episodes of VT on 2/23, received ATP but no shocks. Started on mexiletine 250 bid.  On 5/23, she had VT with ATP and 1 ICD shock. Had been off amio and mex.  Echo in 8/23 showed EF 60-65%, moderate LVH, normal RV, normal IVC.    She was admitted in 04/25 with left lower extremity cellulitis and large R knee effusion following a fall s/p arthrocentesis. Patient readmitted in 06/25 with right elbow cellulitis after failing outpatient treatment. Cardiology consulted d/t hypotension and recurrent AFL. GDMT initially held then restarted prior to discharge. She remained in AFL at discharge,  did not attempt DCCV d/t acute illness.   Current Outpatient Medications  Medication Sig Dispense Refill   acetaminophen  (TYLENOL ) 500 MG tablet Take 500 mg by mouth every 6 (six) hours as needed for moderate pain (pain score 4-6) or headache.     amiodarone  (PACERONE ) 200 MG tablet TAKE 1 TABLET BY MOUTH EVERY DAY 90 tablet 3   BD PEN NEEDLE NANO 2ND GEN 32G X 4 MM MISC See admin instructions.     cefadroxil  (DURICEF) 500 MG capsule Take 1 capsule (500 mg total) by mouth 2 (two) times daily.     dapagliflozin  propanediol (FARXIGA ) 10 MG TABS tablet TAKE 1 TABLET BY MOUTH EVERY DAY BEFORE BREAKFAST 90 tablet 3   doxycycline  (VIBRA -TABS) 100 MG tablet Take 1 tablet (100 mg total) by mouth every 12 (twelve) hours.     ELIQUIS  5 MG TABS tablet TAKE 1 TABLET BY MOUTH TWICE A DAY 60 tablet 11   ENTRESTO  24-26 MG Take 1 tablet by mouth 2 (two) times daily.     fenofibrate  (TRICOR ) 145 MG tablet TAKE 1 TABLET BY MOUTH EVERY DAY 90 tablet 3   furosemide  (LASIX ) 40 MG tablet Take 40 mg by mouth 2 (two) times daily. Take 2 tablets by mouth twice daily     gabapentin  (NEURONTIN ) 300 MG capsule Take 300 mg by mouth 2 (two) times daily as needed (pain).     HUMALOG  MIX 75/25 KWIKPEN (75-25) 100 UNIT/ML KwikPen Take  10 units with morning meal and 10 units with evening meal     levothyroxine  (SYNTHROID ) 50 MCG tablet Take 1 tablet (50 mcg total) by mouth daily. 90 tablet 3   metoprolol  succinate (TOPROL -XL) 25 MG 24 hr tablet TAKE 1 TABLET BY MOUTH EVERYDAY AT BEDTIME 90 tablet 3   mexiletine (MEXITIL ) 150 MG capsule TAKE 2 CAPSULES (300 MG TOTAL) BY MOUTH 2 (TWO) TIMES DAILY. 360 capsule 3   MOUNJARO 2.5 MG/0.5ML Pen Inject 2.5 mg into the skin once a week. Thursdays     Multiple Vitamin (MULTIVITAMIN WITH MINERALS) TABS tablet Take 1 tablet by mouth daily.     nicotine  (NICODERM CQ  - DOSED IN MG/24 HOURS) 21 mg/24hr patch Place 1 patch (21 mg total) onto the skin daily as needed (Nicotine  withdrawal). 28  patch 0   ONETOUCH VERIO test strip 1 each 3 (three) times daily.     Pitavastatin  Calcium  4 MG TABS TAKE 1 TABLET BY MOUTH EVERY DAY 90 tablet 1   potassium chloride  (MICRO-K ) 10 MEQ CR capsule Take 1 capsule (10 mEq total) by mouth 2 (two) times daily. 60 capsule 3   sertraline  (ZOLOFT ) 25 MG tablet TAKE 1 TABLET (25 MG TOTAL) BY MOUTH DAILY. 90 tablet 2   VASCEPA  1 g capsule TAKE 2 CAPSULES BY MOUTH TWICE A DAY 120 capsule 5   No current facility-administered medications for this visit.    Allergies:   Neomycin-bacitracin zn-polymyx, Sulfa antibiotics, Ciprofloxacin , and Spironolactone    Social History:  The patient  reports that she quit smoking about 3 years ago. Her smoking use included cigarettes. She started smoking about 53 years ago. She has a 50 pack-year smoking history. She has never used smokeless tobacco. She reports that she does not drink alcohol and does not use drugs.   Family History:  The patient's family history includes Cancer in her mother; Diabetes in her father, mother, and another family member; Factor V Leiden deficiency in her daughter; Hypertension in her mother; Melanoma in an other family member; Obesity in her father and mother.   There were no vitals taken for this visit.  Wt Readings from Last 3 Encounters:  12/17/23 115.3 kg (254 lb 3.1 oz)  11/26/23 113.4 kg (250 lb)  11/14/23 122.4 kg (269 lb 13.5 oz)   PHYSICAL EXAM: General: Weak appearing. No distress on RA. Arrived by [facility] Cardiac: JVP flat. S1 and S2 present. No murmurs or rub. Resp: Lung sounds clear and equal B/L Abdomen: Soft, obese, non-tender, non-distended.  Extremities: Warm and dry.  No edema. Red, ruddy BLE. Neuro: Alert and oriented x3. Affect pleasant. Generalized weakness  Medtronic Device Interrogation (personally reviewed): In AF 1.9h/day (7.8%), 99% VS, 0.3 VP, No VT, 0.0 hr/day activity. Optivol below threshold. Days with high rates of AF correlate with increase in volume on  OptiVol.   ECG (personally reviewed): SR 66 bpm with 1AVB with RBBB, QRS 154 ms, PR 274 ms  ASSESSMENT AND PLAN: 1. 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 Had been on Tikosyn  but this was not the typical Tikosyn -induced polymorphic VT and she had had no changes in Tikosyn  dosing prior to occurrence. cMRI showed subendocardial LGE in the basal to mid inferolateral wall. This appeared to be a coronary disease pattern however coronary angiogram did not show significant coronary disease. She was transitioned from Tikosyn  to amiodarone  and got Medtronic single chamber ICD on 09/15/20. VT on 2/23 and was started on mexiletine. VT again on 5/23  after missing amiodarone  and mexiletine. No VT on device interrogation. - Continue amiodarone  200 mg daily. Check TSH, Free T3/T4, HFTs today. Gets regular eye exams.  - Continue mexiletine 300 mg bid.  2. Atrial fibrillation/flutter: Paroxysmal. 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  She had atrial fibrillation ablation in 4/18.  In 5/19, she went into atrial fibrillation transiently. In 10/19, she was started on Tikosyn  and DCCV.  Now off Tikosyn  and on amiodarone . NSR on EKG 9/17. Multiple DCCVs with ERAF. Asymptomatic, rate controlled. High AF burden correlates with Optivol rises on device interrogation. Rhythm favors being dry.   - Followed by Dr. Inocencio - Continue amiodarone  200 mg daily - Continue Eliquis  5 mg bid. Denies abnormal bleeding. (Dose reduction pending renal function, currently Cr <1.5)  3. PAD: Left leg claudication with absent left PT pulse.  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 CTA in 5/24 showed right anterior tibial and peroneal arteries occluded, left CIA/EIA/internal iliac artery occluded with reconstitution of SFA.  - Follows w [facility] (Dr. Sheree).   4. Chronic diastolic CHF:  EF 30-35% on echo in 1/18, down from 50-55% in 4/15. 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 In 2/22, cMRI showed LV EF 40% with RV E 38%, inferolateral subendocardial LGE, but echo in 2/22 showed EF 50-55%.  Echo in 8/23 showed EF 60-65%, moderate LVH, normal RV, normal IVC.   - NYHA Class III symptoms, confounded by age and deconditioning.  - Euvolemic on exam and by device interrogation. Wt down 20 lb since Nov. Fluid index < threshold.  - Continue Entresto  24/26 mg bid.  Unable to titrate d/t BP - Continue Lasix  40 mg bid. - Continue Toprol  XL 25 mg daily.   - Continue eplerenone  25 mg daily.  - Continue dapagliflozin  10 mg daily.  - Check BNP/BMET today   5. CAD: Coronary CTA in 2018 showed coronary calcium  score in the 82nd percentile with nonobstructive coronary disease. Cath in 2/22 showed only luminal irregularities. She denies anginal symptoms.  - Not on ASA due to Eliquis .  - Continue Vascepa  and Livalo . LDL 83, TG 307 (3/25). Check HFTs today   6. COPD:  History of smoking. Quit 2/22.  7. OSA: Continue CPAP.  8. Obesity: There is no height or weight on file to calculate BMI. - Unable to tolerate semaglutide  due to nausea. - Unable to afford Mounjaro co-pay 9. CKD Stage IIIb: Baseline SCr ~1.4-1.6. BMET today. 10. Thyroid : H/o hypothyroidism on Synthroid . 5/24: Check TSH, T3/T4  Follow up   [facility], Butler Vegh N, PA-C  01/08/2024

## 2024-01-09 ENCOUNTER — Telehealth: Payer: Self-pay | Admitting: Family Medicine

## 2024-01-09 NOTE — Telephone Encounter (Signed)
 Copied from CRM [id number]. Topic: Appointments - Appointment Info/Confirmation >> Jan 09, 2024  3:13 PM Deaijah H wrote: Patient would like to know if her appointment on 6/27 can become virtual instead stated her daughter has a hard time getting her there. Please call [phone]

## 2024-01-09 NOTE — Telephone Encounter (Signed)
 Pt seen for cellulitis and hyponatremia. Please advise

## 2024-01-10 DIAGNOSIS — I4819 Other persistent atrial fibrillation: Secondary | ICD-10-CM | POA: Diagnosis not present

## 2024-01-10 DIAGNOSIS — I5042 Chronic combined systolic (congestive) and diastolic (congestive) heart failure: Secondary | ICD-10-CM | POA: Diagnosis not present

## 2024-01-10 DIAGNOSIS — M545 Low back pain, unspecified: Secondary | ICD-10-CM | POA: Diagnosis not present

## 2024-01-10 DIAGNOSIS — M19042 Primary osteoarthritis, left hand: Secondary | ICD-10-CM | POA: Diagnosis not present

## 2024-01-10 DIAGNOSIS — E039 Hypothyroidism, unspecified: Secondary | ICD-10-CM | POA: Diagnosis not present

## 2024-01-10 DIAGNOSIS — Z794 Long term (current) use of insulin: Secondary | ICD-10-CM | POA: Diagnosis not present

## 2024-01-10 DIAGNOSIS — M19041 Primary osteoarthritis, right hand: Secondary | ICD-10-CM | POA: Diagnosis not present

## 2024-01-10 DIAGNOSIS — J449 Chronic obstructive pulmonary disease, unspecified: Secondary | ICD-10-CM | POA: Diagnosis not present

## 2024-01-10 DIAGNOSIS — Z7901 Long term (current) use of anticoagulants: Secondary | ICD-10-CM | POA: Diagnosis not present

## 2024-01-10 DIAGNOSIS — Z7984 Long term (current) use of oral hypoglycemic drugs: Secondary | ICD-10-CM | POA: Diagnosis not present

## 2024-01-10 DIAGNOSIS — M19032 Primary osteoarthritis, left wrist: Secondary | ICD-10-CM | POA: Diagnosis not present

## 2024-01-10 DIAGNOSIS — G4733 Obstructive sleep apnea (adult) (pediatric): Secondary | ICD-10-CM | POA: Diagnosis not present

## 2024-01-10 DIAGNOSIS — G8929 Other chronic pain: Secondary | ICD-10-CM | POA: Diagnosis not present

## 2024-01-10 DIAGNOSIS — N183 Chronic kidney disease, stage 3 unspecified: Secondary | ICD-10-CM | POA: Diagnosis not present

## 2024-01-10 DIAGNOSIS — M25461 Effusion, right knee: Secondary | ICD-10-CM | POA: Diagnosis not present

## 2024-01-10 DIAGNOSIS — M19031 Primary osteoarthritis, right wrist: Secondary | ICD-10-CM | POA: Diagnosis not present

## 2024-01-10 DIAGNOSIS — E1122 Type 2 diabetes mellitus with diabetic chronic kidney disease: Secondary | ICD-10-CM | POA: Diagnosis not present

## 2024-01-10 DIAGNOSIS — I130 Hypertensive heart and chronic kidney disease with heart failure and stage 1 through stage 4 chronic kidney disease, or unspecified chronic kidney disease: Secondary | ICD-10-CM | POA: Diagnosis not present

## 2024-01-10 DIAGNOSIS — E785 Hyperlipidemia, unspecified: Secondary | ICD-10-CM | POA: Diagnosis not present

## 2024-01-10 DIAGNOSIS — I472 Ventricular tachycardia, unspecified: Secondary | ICD-10-CM | POA: Diagnosis not present

## 2024-01-10 DIAGNOSIS — I4892 Unspecified atrial flutter: Secondary | ICD-10-CM | POA: Diagnosis not present

## 2024-01-10 DIAGNOSIS — E1151 Type 2 diabetes mellitus with diabetic peripheral angiopathy without gangrene: Secondary | ICD-10-CM | POA: Diagnosis not present

## 2024-01-11 ENCOUNTER — Inpatient Hospital Stay: Admitting: Family Medicine

## 2024-01-11 ENCOUNTER — Telehealth (INDEPENDENT_AMBULATORY_CARE_PROVIDER_SITE_OTHER): Admitting: Family Medicine

## 2024-01-11 DIAGNOSIS — N183 Chronic kidney disease, stage 3 unspecified: Secondary | ICD-10-CM | POA: Diagnosis not present

## 2024-01-11 DIAGNOSIS — M19032 Primary osteoarthritis, left wrist: Secondary | ICD-10-CM | POA: Diagnosis not present

## 2024-01-11 DIAGNOSIS — I5042 Chronic combined systolic (congestive) and diastolic (congestive) heart failure: Secondary | ICD-10-CM | POA: Diagnosis not present

## 2024-01-11 DIAGNOSIS — I4892 Unspecified atrial flutter: Secondary | ICD-10-CM | POA: Diagnosis not present

## 2024-01-11 DIAGNOSIS — L03113 Cellulitis of right upper limb: Secondary | ICD-10-CM

## 2024-01-11 DIAGNOSIS — E785 Hyperlipidemia, unspecified: Secondary | ICD-10-CM | POA: Diagnosis not present

## 2024-01-11 DIAGNOSIS — M25461 Effusion, right knee: Secondary | ICD-10-CM | POA: Diagnosis not present

## 2024-01-11 DIAGNOSIS — M19031 Primary osteoarthritis, right wrist: Secondary | ICD-10-CM | POA: Diagnosis not present

## 2024-01-11 DIAGNOSIS — Z7901 Long term (current) use of anticoagulants: Secondary | ICD-10-CM | POA: Diagnosis not present

## 2024-01-11 DIAGNOSIS — M19041 Primary osteoarthritis, right hand: Secondary | ICD-10-CM | POA: Diagnosis not present

## 2024-01-11 DIAGNOSIS — I472 Ventricular tachycardia, unspecified: Secondary | ICD-10-CM | POA: Diagnosis not present

## 2024-01-11 DIAGNOSIS — E1151 Type 2 diabetes mellitus with diabetic peripheral angiopathy without gangrene: Secondary | ICD-10-CM | POA: Diagnosis not present

## 2024-01-11 DIAGNOSIS — M19042 Primary osteoarthritis, left hand: Secondary | ICD-10-CM | POA: Diagnosis not present

## 2024-01-11 DIAGNOSIS — M545 Low back pain, unspecified: Secondary | ICD-10-CM | POA: Diagnosis not present

## 2024-01-11 DIAGNOSIS — E039 Hypothyroidism, unspecified: Secondary | ICD-10-CM | POA: Diagnosis not present

## 2024-01-11 DIAGNOSIS — E1122 Type 2 diabetes mellitus with diabetic chronic kidney disease: Secondary | ICD-10-CM | POA: Diagnosis not present

## 2024-01-11 DIAGNOSIS — G4733 Obstructive sleep apnea (adult) (pediatric): Secondary | ICD-10-CM | POA: Diagnosis not present

## 2024-01-11 DIAGNOSIS — Z794 Long term (current) use of insulin: Secondary | ICD-10-CM | POA: Diagnosis not present

## 2024-01-11 DIAGNOSIS — J449 Chronic obstructive pulmonary disease, unspecified: Secondary | ICD-10-CM | POA: Diagnosis not present

## 2024-01-11 DIAGNOSIS — G8929 Other chronic pain: Secondary | ICD-10-CM | POA: Diagnosis not present

## 2024-01-11 DIAGNOSIS — I4819 Other persistent atrial fibrillation: Secondary | ICD-10-CM | POA: Diagnosis not present

## 2024-01-11 DIAGNOSIS — I130 Hypertensive heart and chronic kidney disease with heart failure and stage 1 through stage 4 chronic kidney disease, or unspecified chronic kidney disease: Secondary | ICD-10-CM | POA: Diagnosis not present

## 2024-01-11 DIAGNOSIS — Z7984 Long term (current) use of oral hypoglycemic drugs: Secondary | ICD-10-CM | POA: Diagnosis not present

## 2024-01-11 MED ORDER — DOXYCYCLINE HYCLATE 100 MG PO TABS: 100.0000 mg | ORAL_TABLET | Freq: Two times a day (BID) | ORAL | 0 refills | Status: DC

## 2024-01-11 NOTE — Progress Notes (Signed)
 MyChart Video Visit    Virtual Visit via Video Note   This patient is at least at moderate risk for complications without adequate follow up. This format is felt to be most appropriate for this patient at this time. Physical exam was limited by quality of the video and audio technology used for the visit. heather was able to get the patient set up on a video visit.  Patient location: home with daughter  Patient and provider in visit Provider location: Office  I discussed the limitations of evaluation and management by telemedicine and the availability of in person appointments. The patient expressed understanding and agreed to proceed.  Visit Date: 01/11/2024  Today's healthcare provider: Jamee JONELLE Antonio Cyndee, DO     Subjective:    Patient ID: Heidi Stark, female    DOB: 01-19-1941, 83 y.o.   MRN: [id number]  Chief Complaint  Patient presents with   Hospitalization Follow-up    HPI Patient is in today for f/u from hosp.   Discussed the use of AI scribe software for clinical note transcription with the patient, who gave verbal consent to proceed.  History of Present Illness Heidi Stark is an 83 year old female who presents for follow-up after hospitalization for cellulitis in her elbow. She is accompanied by her caregiver, [name].  She experienced a sudden onset of elbow pain and swelling, leading to a week and a half hospitalization where she received intravenous antibiotics. She is no longer on antibiotics and notes slight redness at the tip of her elbow without heat or pain.  The cellulitis has impacted her mobility. Previously able to walk short distances, she now needs to rebuild her strength after being bedridden during her hospital stay. She is currently undergoing occupational and physical therapy to regain her strength and mobility.  She recalls being discharged with a limited supply of antibiotics, which lasted only a day or two after leaving the hospital. She  also shares a negative experience at [facility], where she received therapy and faced delays in receiving assistance.  She is adjusting to changes in her living situation following the death of her husband, [name], and has sold her house. [facility] uses [facility] [address] and [facility] for her prescriptions.    Past Medical History:  Diagnosis Date   Arthritis    hands, wrists (11/07/2016)   CHF (congestive heart failure) (HCC)    Chronic lower back pain    Constipation    DVT (deep venous thrombosis) (HCC)    History of blood transfusion 1990   related to OR   History of kidney stones    Hyperlipidemia    Hypertension    Joint pain    Lower extremity edema    OSA on CPAP    Persistent atrial fibrillation (HCC)    Pneumonia    several times (11/07/2016)   Type II diabetes mellitus (HCC)     Past Surgical History:  Procedure Laterality Date   ATRIAL FIBRILLATION ABLATION N/A 11/07/2016   Procedure: Atrial Fibrillation Ablation;  Surgeon: Will Gladis Norton, MD;  Location: [facility];  Service: Cardiovascular;  Laterality: N/A;   BACK SURGERY     CARDIAC CATHETERIZATION     CARDIOVERSION N/A 08/11/2016   Procedure: CARDIOVERSION;  Surgeon: Ezra GORMAN Shuck, MD;  Location: [facility] ENDOSCOPY;  Service: Cardiovascular;  Laterality: N/A;   CARDIOVERSION N/A 12/03/2017   Procedure: CARDIOVERSION;  Surgeon: Shuck Ezra GORMAN, MD;  Location: [facility] ENDOSCOPY;  Service:  Cardiovascular;  Laterality: N/A;   CARDIOVERSION N/A 05/02/2018   Procedure: CARDIOVERSION;  Surgeon: Mona Vinie BROCKS, MD;  Location: [facility] ENDOSCOPY;  Service: Cardiovascular;  Laterality: N/A;   CARDIOVERSION N/A 06/23/2020   Procedure: CARDIOVERSION;  Surgeon: Hobart Powell BRAVO, MD;  Location: [facility] ENDOSCOPY;  Service: Cardiovascular;  Laterality: N/A;   CARDIOVERSION N/A 10/11/2020   Procedure: CARDIOVERSION;  Surgeon: Rolan Ezra RAMAN, MD;  Location: [facility] ENDOSCOPY;  Service: Cardiovascular;  Laterality: N/A;    CARPAL TUNNEL RELEASE     CATARACT EXTRACTION W/ INTRAOCULAR LENS  IMPLANT, BILATERAL Bilateral    COLON SURGERY  1990   vein graft and colon repair after nicked artery with back surgert   COLONOSCOPY     CORONARY ANGIOGRAPHY N/A 09/08/2020   Procedure: CORONARY ANGIOGRAPHY;  Surgeon: Rolan Ezra RAMAN, MD;  Location: [facility] INVAS[facility] CV LAB;  Service: Cardiovascular;  Laterality: N/A;   CYSTECTOMY     between bladder and kidneys   GANGLION CYST EXCISION Left    ICD IMPLANT N/A 09/15/2020   Procedure: ICD IMPLANT;  Surgeon: Inocencio Soyla Lunger, MD;  Location: [facility];  Service: Cardiovascular;  Laterality: N/A;   KNEE CARTILAGE SURGERY Right 1960s   LUMBAR DISC SURGERY  1990   REPAIR ILIAC ARTERY  1990   vein graft and colon repair after nicked artery with back surgert   TEE WITHOUT CARDIOVERSION N/A 10/31/2013   Procedure: TRANSESOPHAGEAL ECHOCARDIOGRAM (TEE);  Surgeon: Ezra RAMAN Rolan, MD;  Location: [facility] ENDOSCOPY;  Service: Cardiovascular;  Laterality: N/A;   TUBAL LIGATION      Family History  Problem Relation Age of Onset   Diabetes Mother    Hypertension Mother    Cancer Mother    Obesity Mother    Diabetes Father    Obesity Father    Diabetes Other    Melanoma Other    Factor V Leiden deficiency Daughter     Social History   Socioeconomic History   Marital status: Married    Spouse name: [name]   Number of children: 2   Years of education: Not on file   Highest education level: Not on file  Occupational History   Occupation: Retired  Tobacco Use   Smoking status: Former    Current packs/day: 0.00    Average packs/day: 1 pack/day for 50.0 years (50.0 ttl pk-yrs)    Types: Cigarettes    Start date: 08/17/1970    Quit date: 08/17/2020    Years since quitting: 3.4   Smokeless tobacco: Never   Tobacco comments:    Quit 2022. [employer]  Vaping Use   Vaping status: Never Used  Substance and Sexual Activity   Alcohol use: No    Alcohol/week: 0.0 standard drinks  of alcohol   Drug use: No   Sexual activity: Not on file  Other Topics Concern   Not on file  Social History Narrative   Married x 67 years.    1 son and 1 daughter.   1 grandson.   Social Drivers of Health   Financial Resource Strain: Medium Risk (10/13/2021)   Overall Financial Resource Strain (CARDIA)    Difficulty of Paying Living Expenses: Somewhat hard  Food Insecurity: No Food Insecurity (12/17/2023)   Hunger Vital Sign    Worried About Running Out of Food in the Last Year: Never true    Ran Out of Food in the Last Year: Never true  Transportation Needs: No Transportation Needs (12/17/2023)   PRAPARE - Transportation  Lack of Transportation (Medical): No    Lack of Transportation (Non-Medical): No  Physical Activity: Insufficiently Active (10/13/2021)   Exercise Vital Sign    Days of Exercise per Week: 2 days    Minutes of Exercise per Session: 20 min  Stress: No Stress Concern Present (10/13/2021)   [employer] of Occupational Health - Occupational Stress Questionnaire    Feeling of Stress : Not at all  Social Connections: Moderately Integrated (12/17/2023)   Social Connection and Isolation Panel    Frequency of Communication with Friends and Family: More than three times a week    Frequency of Social Gatherings with Friends and Family: More than three times a week    Attends Religious Services: 1 to 4 times per year    Active Member of [employer] or Organizations: No    Attends Banker Meetings: 1 to 4 times per year    Marital Status: Widowed  Intimate Partner Violence: Not At Risk (12/17/2023)   Humiliation, Afraid, Rape, and Kick questionnaire    Fear of Current or Ex-Partner: No    Emotionally Abused: No    Physically Abused: No    Sexually Abused: No    Outpatient Medications Prior to Visit  Medication Sig Dispense Refill   acetaminophen  (TYLENOL ) 500 MG tablet Take 500 mg by mouth every 6 (six) hours as needed for moderate pain (pain score 4-6) or  headache.     amiodarone  (PACERONE ) 200 MG tablet TAKE 1 TABLET BY MOUTH EVERY DAY 90 tablet 3   BD PEN NEEDLE NANO 2ND GEN 32G X 4 MM MISC See admin instructions.     cefadroxil  (DURICEF) 500 MG capsule Take 1 capsule (500 mg total) by mouth 2 (two) times daily.     dapagliflozin  propanediol (FARXIGA ) 10 MG TABS tablet TAKE 1 TABLET BY MOUTH EVERY DAY BEFORE BREAKFAST 90 tablet 3   ELIQUIS  5 MG TABS tablet TAKE 1 TABLET BY MOUTH TWICE A DAY 60 tablet 11   ENTRESTO  24-26 MG Take 1 tablet by mouth 2 (two) times daily.     fenofibrate  (TRICOR ) 145 MG tablet TAKE 1 TABLET BY MOUTH EVERY DAY 90 tablet 3   furosemide  (LASIX ) 40 MG tablet Take 40 mg by mouth 2 (two) times daily. Take 2 tablets by mouth twice daily     gabapentin  (NEURONTIN ) 300 MG capsule Take 300 mg by mouth 2 (two) times daily as needed (pain).     HUMALOG  MIX 75/25 KWIKPEN (75-25) 100 UNIT/ML KwikPen Take 10 units with morning meal and 10 units with evening meal     levothyroxine  (SYNTHROID ) 50 MCG tablet Take 1 tablet (50 mcg total) by mouth daily. 90 tablet 3   metoprolol  succinate (TOPROL -XL) 25 MG 24 hr tablet TAKE 1 TABLET BY MOUTH EVERYDAY AT BEDTIME 90 tablet 3   mexiletine (MEXITIL ) 150 MG capsule TAKE 2 CAPSULES (300 MG TOTAL) BY MOUTH 2 (TWO) TIMES DAILY. 360 capsule 3   MOUNJARO 2.5 MG/0.5ML Pen Inject 2.5 mg into the skin once a week. Thursdays     Multiple Vitamin (MULTIVITAMIN WITH MINERALS) TABS tablet Take 1 tablet by mouth daily.     nicotine  (NICODERM CQ  - DOSED IN MG/24 HOURS) 21 mg/24hr patch Place 1 patch (21 mg total) onto the skin daily as needed (Nicotine  withdrawal). 28 patch 0   ONETOUCH VERIO test strip 1 each 3 (three) times daily.     Pitavastatin  Calcium  4 MG TABS TAKE 1 TABLET BY MOUTH EVERY DAY 90 tablet  1   potassium chloride  (MICRO-K ) 10 MEQ CR capsule Take 1 capsule (10 mEq total) by mouth 2 (two) times daily. 60 capsule 3   sertraline  (ZOLOFT ) 25 MG tablet TAKE 1 TABLET (25 MG TOTAL) BY MOUTH  DAILY. 90 tablet 2   VASCEPA  1 g capsule TAKE 2 CAPSULES BY MOUTH TWICE A DAY 120 capsule 5   doxycycline  (VIBRA -TABS) 100 MG tablet Take 1 tablet (100 mg total) by mouth every 12 (twelve) hours.     No facility-administered medications prior to visit.    Allergies  Allergen Reactions   Neomycin-Bacitracin Zn-Polymyx Rash   Sulfa Antibiotics Diarrhea and Nausea And Vomiting   Ciprofloxacin  Hives, Itching and Rash    Pt doesn't remember having any reaction to cipro    Spironolactone  Rash    Review of Systems  Constitutional:  Negative for fever and malaise/fatigue.  HENT:  Negative for congestion.   Eyes:  Negative for blurred vision.  Respiratory:  Negative for shortness of breath.   Cardiovascular:  Negative for chest pain, palpitations and leg swelling.  Gastrointestinal:  Negative for abdominal pain, blood in stool and nausea.  Genitourinary:  Negative for dysuria and frequency.  Musculoskeletal:  Negative for falls.  Skin:  Negative for rash.  Neurological:  Negative for dizziness, loss of consciousness and headaches.  Endo/Heme/Allergies:  Negative for environmental allergies.  Psychiatric/Behavioral:  Negative for depression. The patient is not nervous/anxious.        Objective:    Physical Exam Vitals and nursing note reviewed.  Constitutional:      Appearance: Normal appearance.   Skin:    Coloration: Skin is not jaundiced.     Findings: Erythema present. No bruising or lesion.     Comments: R elbow---  + redness on elbow , no pain to touch per pt and not hot to touch   Neurological:     Mental Status: She is alert.     There were no vitals taken for this visit. Wt Readings from Last 3 Encounters:  12/17/23 254 lb 3.1 oz (115.3 kg)  11/26/23 250 lb (113.4 kg)  11/14/23 269 lb 13.5 oz (122.4 kg)       Assessment & Plan:  Cellulitis of right elbow  Other orders -     Doxycycline  Hyclate; Take 1 tablet (100 mg total) by mouth every 12 (twelve) hours.   Dispense: 20 tablet; Refill: 0    Assessment and Plan Assessment & Plan Cellulitis of the elbow   Recently hospitalized for cellulitis of the elbow, she required intravenous antibiotics for 10 days. Significant improvement is noted with only a small area of residual erythema at the elbow tip. Absence of heat or pain suggests resolution of the acute infection. Prescribe doxycycline  to ensure complete resolution of the infection. Schedule follow-up in 1-2 weeks to reassess the elbow.  Decreased mobility and strength   She reports decreased mobility and strength post-hospitalization for cellulitis. Currently undergoing occupational and physical therapy to regain ambulation. Acknowledges challenges in regaining strength at her age and is committed to gradual improvement. Continue occupational and physical therapy to enhance mobility and strength. Encourage gradual increase in activity as tolerated.   I discussed the assessment and treatment plan with the patient. The patient was provided an opportunity to ask questions and all were answered. The patient agreed with the plan and demonstrated an understanding of the instructions.   The patient was advised to call back or seek an in-person evaluation if the symptoms worsen or if  the condition fails to improve as anticipated.  Jamee JONELLE Antonio Cyndee, DO [facility] [phone] (phone) [phone] (fax)  [facility]

## 2024-01-11 NOTE — Telephone Encounter (Signed)
 Appt changed to virtual

## 2024-01-14 ENCOUNTER — Telehealth: Payer: Self-pay

## 2024-01-14 NOTE — Telephone Encounter (Unsigned)
 Copied from CRM [id number]. Topic: Clinical - Home Health Verbal Orders >> Jan 14, 2024  4:05 PM Chiquita SQUIBB wrote: Caller/Agency: [facility]  Callback Number: [phone] Service Requested: Occupational Therapy Frequency: 1 week 1, skip a week, 2 times 1 week, 1 week 1 Any new concerns about the patient? No

## 2024-01-15 ENCOUNTER — Encounter (HOSPITAL_COMMUNITY)

## 2024-01-16 DIAGNOSIS — E039 Hypothyroidism, unspecified: Secondary | ICD-10-CM | POA: Diagnosis not present

## 2024-01-16 DIAGNOSIS — M545 Low back pain, unspecified: Secondary | ICD-10-CM | POA: Diagnosis not present

## 2024-01-16 DIAGNOSIS — E1151 Type 2 diabetes mellitus with diabetic peripheral angiopathy without gangrene: Secondary | ICD-10-CM | POA: Diagnosis not present

## 2024-01-16 DIAGNOSIS — G8929 Other chronic pain: Secondary | ICD-10-CM | POA: Diagnosis not present

## 2024-01-16 DIAGNOSIS — M25461 Effusion, right knee: Secondary | ICD-10-CM | POA: Diagnosis not present

## 2024-01-16 DIAGNOSIS — I4819 Other persistent atrial fibrillation: Secondary | ICD-10-CM | POA: Diagnosis not present

## 2024-01-16 DIAGNOSIS — I4892 Unspecified atrial flutter: Secondary | ICD-10-CM | POA: Diagnosis not present

## 2024-01-16 DIAGNOSIS — Z7984 Long term (current) use of oral hypoglycemic drugs: Secondary | ICD-10-CM | POA: Diagnosis not present

## 2024-01-16 DIAGNOSIS — I472 Ventricular tachycardia, unspecified: Secondary | ICD-10-CM | POA: Diagnosis not present

## 2024-01-16 DIAGNOSIS — M19042 Primary osteoarthritis, left hand: Secondary | ICD-10-CM | POA: Diagnosis not present

## 2024-01-16 DIAGNOSIS — Z794 Long term (current) use of insulin: Secondary | ICD-10-CM | POA: Diagnosis not present

## 2024-01-16 DIAGNOSIS — E785 Hyperlipidemia, unspecified: Secondary | ICD-10-CM | POA: Diagnosis not present

## 2024-01-16 DIAGNOSIS — I5042 Chronic combined systolic (congestive) and diastolic (congestive) heart failure: Secondary | ICD-10-CM | POA: Diagnosis not present

## 2024-01-16 DIAGNOSIS — M19031 Primary osteoarthritis, right wrist: Secondary | ICD-10-CM | POA: Diagnosis not present

## 2024-01-16 DIAGNOSIS — I130 Hypertensive heart and chronic kidney disease with heart failure and stage 1 through stage 4 chronic kidney disease, or unspecified chronic kidney disease: Secondary | ICD-10-CM | POA: Diagnosis not present

## 2024-01-16 DIAGNOSIS — N183 Chronic kidney disease, stage 3 unspecified: Secondary | ICD-10-CM | POA: Diagnosis not present

## 2024-01-16 DIAGNOSIS — M19032 Primary osteoarthritis, left wrist: Secondary | ICD-10-CM | POA: Diagnosis not present

## 2024-01-16 DIAGNOSIS — J449 Chronic obstructive pulmonary disease, unspecified: Secondary | ICD-10-CM | POA: Diagnosis not present

## 2024-01-16 DIAGNOSIS — Z7901 Long term (current) use of anticoagulants: Secondary | ICD-10-CM | POA: Diagnosis not present

## 2024-01-16 DIAGNOSIS — M19041 Primary osteoarthritis, right hand: Secondary | ICD-10-CM | POA: Diagnosis not present

## 2024-01-16 DIAGNOSIS — E1122 Type 2 diabetes mellitus with diabetic chronic kidney disease: Secondary | ICD-10-CM | POA: Diagnosis not present

## 2024-01-16 DIAGNOSIS — G4733 Obstructive sleep apnea (adult) (pediatric): Secondary | ICD-10-CM | POA: Diagnosis not present

## 2024-01-16 NOTE — Telephone Encounter (Signed)
Verbal given via VM.  

## 2024-01-17 DIAGNOSIS — I4819 Other persistent atrial fibrillation: Secondary | ICD-10-CM | POA: Diagnosis not present

## 2024-01-17 DIAGNOSIS — I130 Hypertensive heart and chronic kidney disease with heart failure and stage 1 through stage 4 chronic kidney disease, or unspecified chronic kidney disease: Secondary | ICD-10-CM | POA: Diagnosis not present

## 2024-01-17 DIAGNOSIS — M25461 Effusion, right knee: Secondary | ICD-10-CM | POA: Diagnosis not present

## 2024-01-17 DIAGNOSIS — Z794 Long term (current) use of insulin: Secondary | ICD-10-CM | POA: Diagnosis not present

## 2024-01-17 DIAGNOSIS — I472 Ventricular tachycardia, unspecified: Secondary | ICD-10-CM | POA: Diagnosis not present

## 2024-01-17 DIAGNOSIS — E1151 Type 2 diabetes mellitus with diabetic peripheral angiopathy without gangrene: Secondary | ICD-10-CM | POA: Diagnosis not present

## 2024-01-17 DIAGNOSIS — E1122 Type 2 diabetes mellitus with diabetic chronic kidney disease: Secondary | ICD-10-CM | POA: Diagnosis not present

## 2024-01-17 DIAGNOSIS — E039 Hypothyroidism, unspecified: Secondary | ICD-10-CM | POA: Diagnosis not present

## 2024-01-17 DIAGNOSIS — G4733 Obstructive sleep apnea (adult) (pediatric): Secondary | ICD-10-CM | POA: Diagnosis not present

## 2024-01-17 DIAGNOSIS — E785 Hyperlipidemia, unspecified: Secondary | ICD-10-CM | POA: Diagnosis not present

## 2024-01-17 DIAGNOSIS — M19031 Primary osteoarthritis, right wrist: Secondary | ICD-10-CM | POA: Diagnosis not present

## 2024-01-17 DIAGNOSIS — M545 Low back pain, unspecified: Secondary | ICD-10-CM | POA: Diagnosis not present

## 2024-01-17 DIAGNOSIS — M19041 Primary osteoarthritis, right hand: Secondary | ICD-10-CM | POA: Diagnosis not present

## 2024-01-17 DIAGNOSIS — G8929 Other chronic pain: Secondary | ICD-10-CM | POA: Diagnosis not present

## 2024-01-17 DIAGNOSIS — J449 Chronic obstructive pulmonary disease, unspecified: Secondary | ICD-10-CM | POA: Diagnosis not present

## 2024-01-17 DIAGNOSIS — M19032 Primary osteoarthritis, left wrist: Secondary | ICD-10-CM | POA: Diagnosis not present

## 2024-01-17 DIAGNOSIS — Z7901 Long term (current) use of anticoagulants: Secondary | ICD-10-CM | POA: Diagnosis not present

## 2024-01-17 DIAGNOSIS — N183 Chronic kidney disease, stage 3 unspecified: Secondary | ICD-10-CM | POA: Diagnosis not present

## 2024-01-17 DIAGNOSIS — I4892 Unspecified atrial flutter: Secondary | ICD-10-CM | POA: Diagnosis not present

## 2024-01-17 DIAGNOSIS — Z7984 Long term (current) use of oral hypoglycemic drugs: Secondary | ICD-10-CM | POA: Diagnosis not present

## 2024-01-17 DIAGNOSIS — M19042 Primary osteoarthritis, left hand: Secondary | ICD-10-CM | POA: Diagnosis not present

## 2024-01-17 DIAGNOSIS — I5042 Chronic combined systolic (congestive) and diastolic (congestive) heart failure: Secondary | ICD-10-CM | POA: Diagnosis not present

## 2024-01-21 ENCOUNTER — Other Ambulatory Visit: Payer: Self-pay | Admitting: Cardiology

## 2024-01-21 DIAGNOSIS — M19032 Primary osteoarthritis, left wrist: Secondary | ICD-10-CM | POA: Diagnosis not present

## 2024-01-21 DIAGNOSIS — I472 Ventricular tachycardia, unspecified: Secondary | ICD-10-CM | POA: Diagnosis not present

## 2024-01-21 DIAGNOSIS — M545 Low back pain, unspecified: Secondary | ICD-10-CM | POA: Diagnosis not present

## 2024-01-21 DIAGNOSIS — M19031 Primary osteoarthritis, right wrist: Secondary | ICD-10-CM | POA: Diagnosis not present

## 2024-01-21 DIAGNOSIS — I130 Hypertensive heart and chronic kidney disease with heart failure and stage 1 through stage 4 chronic kidney disease, or unspecified chronic kidney disease: Secondary | ICD-10-CM | POA: Diagnosis not present

## 2024-01-21 DIAGNOSIS — I4892 Unspecified atrial flutter: Secondary | ICD-10-CM | POA: Diagnosis not present

## 2024-01-21 DIAGNOSIS — Z7984 Long term (current) use of oral hypoglycemic drugs: Secondary | ICD-10-CM | POA: Diagnosis not present

## 2024-01-21 DIAGNOSIS — Z794 Long term (current) use of insulin: Secondary | ICD-10-CM | POA: Diagnosis not present

## 2024-01-21 DIAGNOSIS — J449 Chronic obstructive pulmonary disease, unspecified: Secondary | ICD-10-CM | POA: Diagnosis not present

## 2024-01-21 DIAGNOSIS — E1151 Type 2 diabetes mellitus with diabetic peripheral angiopathy without gangrene: Secondary | ICD-10-CM | POA: Diagnosis not present

## 2024-01-21 DIAGNOSIS — Z7901 Long term (current) use of anticoagulants: Secondary | ICD-10-CM | POA: Diagnosis not present

## 2024-01-21 DIAGNOSIS — G8929 Other chronic pain: Secondary | ICD-10-CM | POA: Diagnosis not present

## 2024-01-21 DIAGNOSIS — E785 Hyperlipidemia, unspecified: Secondary | ICD-10-CM | POA: Diagnosis not present

## 2024-01-21 DIAGNOSIS — I5042 Chronic combined systolic (congestive) and diastolic (congestive) heart failure: Secondary | ICD-10-CM | POA: Diagnosis not present

## 2024-01-21 DIAGNOSIS — G4733 Obstructive sleep apnea (adult) (pediatric): Secondary | ICD-10-CM | POA: Diagnosis not present

## 2024-01-21 DIAGNOSIS — N183 Chronic kidney disease, stage 3 unspecified: Secondary | ICD-10-CM | POA: Diagnosis not present

## 2024-01-21 DIAGNOSIS — E039 Hypothyroidism, unspecified: Secondary | ICD-10-CM | POA: Diagnosis not present

## 2024-01-21 DIAGNOSIS — M25461 Effusion, right knee: Secondary | ICD-10-CM | POA: Diagnosis not present

## 2024-01-21 DIAGNOSIS — M19041 Primary osteoarthritis, right hand: Secondary | ICD-10-CM | POA: Diagnosis not present

## 2024-01-21 DIAGNOSIS — E1122 Type 2 diabetes mellitus with diabetic chronic kidney disease: Secondary | ICD-10-CM | POA: Diagnosis not present

## 2024-01-21 DIAGNOSIS — M19042 Primary osteoarthritis, left hand: Secondary | ICD-10-CM | POA: Diagnosis not present

## 2024-01-21 DIAGNOSIS — I4819 Other persistent atrial fibrillation: Secondary | ICD-10-CM | POA: Diagnosis not present

## 2024-01-22 ENCOUNTER — Other Ambulatory Visit

## 2024-01-23 DIAGNOSIS — M19041 Primary osteoarthritis, right hand: Secondary | ICD-10-CM | POA: Diagnosis not present

## 2024-01-23 DIAGNOSIS — M545 Low back pain, unspecified: Secondary | ICD-10-CM | POA: Diagnosis not present

## 2024-01-23 DIAGNOSIS — I4892 Unspecified atrial flutter: Secondary | ICD-10-CM | POA: Diagnosis not present

## 2024-01-23 DIAGNOSIS — I5042 Chronic combined systolic (congestive) and diastolic (congestive) heart failure: Secondary | ICD-10-CM | POA: Diagnosis not present

## 2024-01-23 DIAGNOSIS — G8929 Other chronic pain: Secondary | ICD-10-CM | POA: Diagnosis not present

## 2024-01-23 DIAGNOSIS — Z794 Long term (current) use of insulin: Secondary | ICD-10-CM | POA: Diagnosis not present

## 2024-01-23 DIAGNOSIS — I130 Hypertensive heart and chronic kidney disease with heart failure and stage 1 through stage 4 chronic kidney disease, or unspecified chronic kidney disease: Secondary | ICD-10-CM | POA: Diagnosis not present

## 2024-01-23 DIAGNOSIS — M19042 Primary osteoarthritis, left hand: Secondary | ICD-10-CM | POA: Diagnosis not present

## 2024-01-23 DIAGNOSIS — M19031 Primary osteoarthritis, right wrist: Secondary | ICD-10-CM | POA: Diagnosis not present

## 2024-01-23 DIAGNOSIS — G4733 Obstructive sleep apnea (adult) (pediatric): Secondary | ICD-10-CM | POA: Diagnosis not present

## 2024-01-23 DIAGNOSIS — E785 Hyperlipidemia, unspecified: Secondary | ICD-10-CM | POA: Diagnosis not present

## 2024-01-23 DIAGNOSIS — I4819 Other persistent atrial fibrillation: Secondary | ICD-10-CM | POA: Diagnosis not present

## 2024-01-23 DIAGNOSIS — E1151 Type 2 diabetes mellitus with diabetic peripheral angiopathy without gangrene: Secondary | ICD-10-CM | POA: Diagnosis not present

## 2024-01-23 DIAGNOSIS — M19032 Primary osteoarthritis, left wrist: Secondary | ICD-10-CM | POA: Diagnosis not present

## 2024-01-23 DIAGNOSIS — N183 Chronic kidney disease, stage 3 unspecified: Secondary | ICD-10-CM | POA: Diagnosis not present

## 2024-01-23 DIAGNOSIS — Z7984 Long term (current) use of oral hypoglycemic drugs: Secondary | ICD-10-CM | POA: Diagnosis not present

## 2024-01-23 DIAGNOSIS — M25461 Effusion, right knee: Secondary | ICD-10-CM | POA: Diagnosis not present

## 2024-01-23 DIAGNOSIS — I472 Ventricular tachycardia, unspecified: Secondary | ICD-10-CM | POA: Diagnosis not present

## 2024-01-23 DIAGNOSIS — J449 Chronic obstructive pulmonary disease, unspecified: Secondary | ICD-10-CM | POA: Diagnosis not present

## 2024-01-23 DIAGNOSIS — E1122 Type 2 diabetes mellitus with diabetic chronic kidney disease: Secondary | ICD-10-CM | POA: Diagnosis not present

## 2024-01-23 DIAGNOSIS — Z7901 Long term (current) use of anticoagulants: Secondary | ICD-10-CM | POA: Diagnosis not present

## 2024-01-23 DIAGNOSIS — E039 Hypothyroidism, unspecified: Secondary | ICD-10-CM | POA: Diagnosis not present

## 2024-01-28 DIAGNOSIS — M19031 Primary osteoarthritis, right wrist: Secondary | ICD-10-CM | POA: Diagnosis not present

## 2024-01-28 DIAGNOSIS — Z7901 Long term (current) use of anticoagulants: Secondary | ICD-10-CM | POA: Diagnosis not present

## 2024-01-28 DIAGNOSIS — G4733 Obstructive sleep apnea (adult) (pediatric): Secondary | ICD-10-CM | POA: Diagnosis not present

## 2024-01-28 DIAGNOSIS — J449 Chronic obstructive pulmonary disease, unspecified: Secondary | ICD-10-CM | POA: Diagnosis not present

## 2024-01-28 DIAGNOSIS — M545 Low back pain, unspecified: Secondary | ICD-10-CM | POA: Diagnosis not present

## 2024-01-28 DIAGNOSIS — Z7984 Long term (current) use of oral hypoglycemic drugs: Secondary | ICD-10-CM | POA: Diagnosis not present

## 2024-01-28 DIAGNOSIS — G8929 Other chronic pain: Secondary | ICD-10-CM | POA: Diagnosis not present

## 2024-01-28 DIAGNOSIS — M25461 Effusion, right knee: Secondary | ICD-10-CM | POA: Diagnosis not present

## 2024-01-28 DIAGNOSIS — E039 Hypothyroidism, unspecified: Secondary | ICD-10-CM | POA: Diagnosis not present

## 2024-01-28 DIAGNOSIS — I4819 Other persistent atrial fibrillation: Secondary | ICD-10-CM | POA: Diagnosis not present

## 2024-01-28 DIAGNOSIS — M19042 Primary osteoarthritis, left hand: Secondary | ICD-10-CM | POA: Diagnosis not present

## 2024-01-28 DIAGNOSIS — I472 Ventricular tachycardia, unspecified: Secondary | ICD-10-CM | POA: Diagnosis not present

## 2024-01-28 DIAGNOSIS — I130 Hypertensive heart and chronic kidney disease with heart failure and stage 1 through stage 4 chronic kidney disease, or unspecified chronic kidney disease: Secondary | ICD-10-CM | POA: Diagnosis not present

## 2024-01-28 DIAGNOSIS — E1122 Type 2 diabetes mellitus with diabetic chronic kidney disease: Secondary | ICD-10-CM | POA: Diagnosis not present

## 2024-01-28 DIAGNOSIS — E1151 Type 2 diabetes mellitus with diabetic peripheral angiopathy without gangrene: Secondary | ICD-10-CM | POA: Diagnosis not present

## 2024-01-28 DIAGNOSIS — M19032 Primary osteoarthritis, left wrist: Secondary | ICD-10-CM | POA: Diagnosis not present

## 2024-01-28 DIAGNOSIS — I5042 Chronic combined systolic (congestive) and diastolic (congestive) heart failure: Secondary | ICD-10-CM | POA: Diagnosis not present

## 2024-01-28 DIAGNOSIS — E785 Hyperlipidemia, unspecified: Secondary | ICD-10-CM | POA: Diagnosis not present

## 2024-01-28 DIAGNOSIS — M19041 Primary osteoarthritis, right hand: Secondary | ICD-10-CM | POA: Diagnosis not present

## 2024-01-28 DIAGNOSIS — N183 Chronic kidney disease, stage 3 unspecified: Secondary | ICD-10-CM | POA: Diagnosis not present

## 2024-01-28 DIAGNOSIS — I4892 Unspecified atrial flutter: Secondary | ICD-10-CM | POA: Diagnosis not present

## 2024-01-28 DIAGNOSIS — Z794 Long term (current) use of insulin: Secondary | ICD-10-CM | POA: Diagnosis not present

## 2024-01-29 ENCOUNTER — Ambulatory Visit: Payer: Self-pay

## 2024-01-29 NOTE — Telephone Encounter (Signed)
 2nd attempt--Left Voicemail for patient to call us  back               Reason for Triage: Patient is calling in stating that she is having nausea with vomiting the last few days.

## 2024-01-29 NOTE — Telephone Encounter (Signed)
 FYI Only or Action Required?: FYI only for provider.  Patient was last seen in primary care on 01/11/2024 by Antonio Meth, Jamee SAUNDERS, DO.  Called Nurse Triage reporting Nausea.  Symptoms began several days ago.  Interventions attempted: Nothing.  Symptoms are: unchanged.  Triage Disposition: No disposition on file.  Patient/caregiver understands and will follow disposition?:  Reason for Disposition  Nausea lasts > 1 week  Answer Assessment - Initial Assessment Questions 1. NAUSEA SEVERITY: How bad is the nausea? (e.g., mild, moderate, severe; dehydration, weight loss)     moderate 2. ONSET: When did the nausea begin?     Four days on and off 3. VOMITING: Any vomiting? If Yes, ask: How many times today?     once 4. RECURRENT SYMPTOM: Have you had nausea before? If Yes, ask: When was the last time? What happened that time?     no 5. CAUSE: What do you think is causing the nausea?     That drink 6. PREGNANCY: Is there any chance you are pregnant? (e.g., unprotected intercourse, missed birth control pill, broken condom)     na  Protocols used: Nausea-A-AH

## 2024-01-29 NOTE — Telephone Encounter (Signed)
 Called pt - left message on machine to return our call.        Reason for Triage: Patient is calling in stating that she is having nausea with vomiting the last few days.

## 2024-01-29 NOTE — Telephone Encounter (Signed)
 FYI Only or Action Required?: FYI only for provider.  Patient was last seen in primary care on 01/11/2024 by Antonio Meth, Jamee SAUNDERS, DO.  Called Nurse Triage reporting Nausea.  Symptoms began several days ago.  Interventions attempted: Nothing.  Symptoms are: unchanged.  Triage Disposition: See PCP When Office is Open (Within 3 Days)  Patient/caregiver understands and will follow disposition?: Yes Reason for Disposition  Nausea lasts > 1 week  Answer Assessment - Initial Assessment Questions 1. NAUSEA SEVERITY: How bad is the nausea? (e.g., mild, moderate, severe; dehydration, weight loss)     moderate 2. ONSET: When did the nausea begin?     Four days on and off 3. VOMITING: Any vomiting? If Yes, ask: How many times today?     once 4. RECURRENT SYMPTOM: Have you had nausea before? If Yes, ask: When was the last time? What happened that time?     no 5. CAUSE: What do you think is causing the nausea?     That drink 6. PREGNANCY: Is there any chance you are pregnant? (e.g., unprotected intercourse, missed birth control pill, broken condom)     na  Protocols used: Nausea-A-AH

## 2024-01-30 ENCOUNTER — Ambulatory Visit (HOSPITAL_BASED_OUTPATIENT_CLINIC_OR_DEPARTMENT_OTHER)
Admission: RE | Admit: 2024-01-30 | Discharge: 2024-01-30 | Disposition: A | Discharge status: Home / Self Care | Source: Ambulatory Visit | Attending: Medical | Admitting: Medical

## 2024-01-30 ENCOUNTER — Ambulatory Visit: Admitting: Medical

## 2024-01-30 VITALS — BP 122/80 | HR 70 | Temp 97.8°F | Resp 16 | Ht 66.0 in

## 2024-01-30 DIAGNOSIS — M25521 Pain in right elbow: Secondary | ICD-10-CM | POA: Insufficient documentation

## 2024-01-30 DIAGNOSIS — L03113 Cellulitis of right upper limb: Secondary | ICD-10-CM | POA: Diagnosis not present

## 2024-01-30 DIAGNOSIS — E119 Type 2 diabetes mellitus without complications: Secondary | ICD-10-CM

## 2024-01-30 DIAGNOSIS — R5383 Other fatigue: Secondary | ICD-10-CM

## 2024-01-30 DIAGNOSIS — K8020 Calculus of gallbladder without cholecystitis without obstruction: Secondary | ICD-10-CM

## 2024-01-30 DIAGNOSIS — R112 Nausea with vomiting, unspecified: Secondary | ICD-10-CM

## 2024-01-30 DIAGNOSIS — R110 Nausea: Secondary | ICD-10-CM

## 2024-01-30 DIAGNOSIS — R7989 Other specified abnormal findings of blood chemistry: Secondary | ICD-10-CM

## 2024-01-30 LAB — CBC WITH DIFFERENTIAL/PLATELET
Basophils Absolute: 0 K/uL (ref 0.0–0.1)
Basophils Relative: 0.4 % (ref 0.0–3.0)
Eosinophils Absolute: 0 K/uL (ref 0.0–0.7)
Eosinophils Relative: 0.4 % (ref 0.0–5.0)
HCT: 54 % — ABNORMAL HIGH (ref 36.0–46.0)
Hemoglobin: 17.6 g/dL — ABNORMAL HIGH (ref 12.0–15.0)
Lymphocytes Relative: 41.2 % (ref 12.0–46.0)
Lymphs Abs: 3 K/uL (ref 0.7–4.0)
MCHC: 32.6 g/dL (ref 30.0–36.0)
MCV: 93.5 fl (ref 78.0–100.0)
Monocytes Absolute: 0.9 K/uL (ref 0.1–1.0)
Monocytes Relative: 12.7 % — ABNORMAL HIGH (ref 3.0–12.0)
Neutro Abs: 3.3 K/uL (ref 1.4–7.7)
Neutrophils Relative %: 45.3 % (ref 43.0–77.0)
Platelets: 137 K/uL — ABNORMAL LOW (ref 150.0–400.0)
RBC: 5.78 Mil/uL — ABNORMAL HIGH (ref 3.87–5.11)
RDW: 15 % (ref 11.5–15.5)
WBC: 7.2 K/uL (ref 4.0–10.5)

## 2024-01-30 LAB — COMPREHENSIVE METABOLIC PANEL WITH GFR
ALT: 62 U/L — ABNORMAL HIGH (ref 0–35)
AST: 134 U/L — ABNORMAL HIGH (ref 0–37)
Albumin: 3.5 g/dL (ref 3.5–5.2)
Alkaline Phosphatase: 61 U/L (ref 39–117)
BUN: 35 mg/dL — ABNORMAL HIGH (ref 6–23)
CO2: 31 meq/L (ref 19–32)
Calcium: 8.4 mg/dL (ref 8.4–10.5)
Chloride: 96 meq/L (ref 96–112)
Creatinine, Ser: 1.34 mg/dL — ABNORMAL HIGH (ref 0.40–1.20)
GFR: 37.04 mL/min — ABNORMAL LOW (ref >60.00)
Glucose, Bld: 110 mg/dL — ABNORMAL HIGH (ref 70–99)
Potassium: 3.9 meq/L (ref 3.5–5.1)
Sodium: 137 meq/L (ref 135–145)
Total Bilirubin: 0.9 mg/dL (ref 0.2–1.2)
Total Protein: 7 g/dL (ref 6.0–8.3)

## 2024-01-30 LAB — LIPASE: Lipase: 39 U/L (ref 11.0–59.0)

## 2024-01-30 MED ORDER — FAMOTIDINE 20 MG PO TABS
20.0000 mg | ORAL_TABLET | Freq: Every day | ORAL | 0 refills | Status: DC
Start: 2024-01-30 — End: 2024-02-18

## 2024-01-30 MED ORDER — ONDANSETRON HCL 4 MG PO TABS: 4.0000 mg | ORAL_TABLET | Freq: Three times a day (TID) | ORAL | 0 refills | Status: DC | PRN

## 2024-01-30 NOTE — Progress Notes (Signed)
 Subjective:    Patient ID: Heidi Stark, female    DOB: March 06, 1941, 83 y.o.   MRN: [id number]  HPI Heidi Stark is an 83 year old female with congestive heart failure, atrial fibrillation, and diabetes who presents with nausea and vomiting.  She has been experiencing nausea and vomiting for the past week, primarily triggered by eating, leading to a significant aversion to food. She has vomited twice during this period, and the symptoms occur regardless of the type of food consumed. No diarrhea is present, but she reports irregular bowel movements that are not constipated.  She feels very tired and has been mildly hydrating. Her recent medical history includes two hospitalizations for bursitis and cellulitis in the elbow, treated with numerous antibiotics, including intravenous antibiotics for a week and a half. She was also treated for sepsis and MRSA. The right elbow remains sensitive at times, according to the patient.  Her past medical history includes congestive heart failure, atrial fibrillation, and type 2 diabetes. She monitors her blood sugar daily, with a recent reading of 110 mg/dL. She has not had a recent A1c test. She was previously on doxycycline  following her hospital discharge for cellulitis but discontinued it after experiencing severe vomiting.  She has not changed any of her medications recently.      Review of Systems  Constitutional:  Positive for fatigue. Negative for chills and fever.  HENT:  Negative for dental problem.   Respiratory:  Negative for cough, choking, shortness of breath and wheezing.   Cardiovascular:  Negative for chest pain and palpitations.  Gastrointestinal:  Positive for nausea and vomiting. Negative for abdominal pain, blood in stool, constipation, diarrhea and rectal pain.       See hpi  Genitourinary:  Negative for dysuria and frequency.  Musculoskeletal:  Negative for back pain and neck pain.       Rare intermittent mild rt elbow pain.   Neurological:  Negative for dizziness, speech difficulty, weakness and light-headedness.  Hematological:  Negative for adenopathy. Does not bruise/bleed easily.  Psychiatric/Behavioral:  Negative for behavioral problems and confusion.     Past Medical History:  Diagnosis Date   Arthritis    hands, wrists (11/07/2016)   CHF (congestive heart failure) (HCC)    Chronic lower back pain    Constipation    DVT (deep venous thrombosis) (HCC)    History of blood transfusion 1990   related to OR   History of kidney stones    Hyperlipidemia    Hypertension    Joint pain    Lower extremity edema    OSA on CPAP    Persistent atrial fibrillation (HCC)    Pneumonia    several times (11/07/2016)   Type II diabetes mellitus (HCC)      Social History   Socioeconomic History   Marital status: Married    Spouse name: Charles   Number of children: 2   Years of education: Not on file   Highest education level: Not on file  Occupational History   Occupation: Retired  Tobacco Use   Smoking status: Former    Current packs/day: 0.00    Average packs/day: 1 pack/day for 50.0 years (50.0 ttl pk-yrs)    Types: Cigarettes    Start date: 08/17/1970    Quit date: 08/17/2020    Years since quitting: 3.4   Smokeless tobacco: Never   Tobacco comments:    Quit 2022. [employer]  Vaping Use   Vaping status: Never Used  Substance and Sexual Activity   Alcohol use: No    Alcohol/week: 0.0 standard drinks of alcohol   Drug use: No   Sexual activity: Not on file  Other Topics Concern   Not on file  Social History Narrative   Married x 67 years.    1 son and 1 daughter.   1 grandson.   Social Drivers of Health   Financial Resource Strain: Medium Risk (10/13/2021)   Overall Financial Resource Strain (CARDIA)    Difficulty of Paying Living Expenses: Somewhat hard  Food Insecurity: No Food Insecurity (12/17/2023)   Hunger Vital Sign    Worried About Running Out of Food in the Last Year: Never true     Ran Out of Food in the Last Year: Never true  Transportation Needs: No Transportation Needs (12/17/2023)   PRAPARE - Administrator, Civil Service (Medical): No    Lack of Transportation (Non-Medical): No  Physical Activity: Insufficiently Active (10/13/2021)   Exercise Vital Sign    Days of Exercise per Week: 2 days    Minutes of Exercise per Session: 20 min  Stress: No Stress Concern Present (10/13/2021)   [employer] of Occupational Health - Occupational Stress Questionnaire    Feeling of Stress : Not at all  Social Connections: Moderately Integrated (12/17/2023)   Social Connection and Isolation Panel    Frequency of Communication with Friends and Family: More than three times a week    Frequency of Social Gatherings with Friends and Family: More than three times a week    Attends Religious Services: 1 to 4 times per year    Active Member of [employer] or Organizations: No    Attends Banker Meetings: 1 to 4 times per year    Marital Status: Widowed  Intimate Partner Violence: Not At Risk (12/17/2023)   Humiliation, Afraid, Rape, and Kick questionnaire    Fear of Current or Ex-Partner: No    Emotionally Abused: No    Physically Abused: No    Sexually Abused: No    Past Surgical History:  Procedure Laterality Date   ATRIAL FIBRILLATION ABLATION N/A 11/07/2016   Procedure: Atrial Fibrillation Ablation;  Surgeon: Will Gladis Norton, MD;  Location: [facility] INVASIVE CV LAB;  Service: Cardiovascular;  Laterality: N/A;   BACK SURGERY     CARDIAC CATHETERIZATION     CARDIOVERSION N/A 08/11/2016   Procedure: CARDIOVERSION;  Surgeon: Ezra GORMAN Shuck, MD;  Location: [facility] ENDOSCOPY;  Service: Cardiovascular;  Laterality: N/A;   CARDIOVERSION N/A 12/03/2017   Procedure: CARDIOVERSION;  Surgeon: Shuck Ezra GORMAN, MD;  Location: [facility] ENDOSCOPY;  Service: Cardiovascular;  Laterality: N/A;   CARDIOVERSION N/A 05/02/2018   Procedure: CARDIOVERSION;  Surgeon: Mona Vinie BROCKS, MD;   Location: [facility] ENDOSCOPY;  Service: Cardiovascular;  Laterality: N/A;   CARDIOVERSION N/A 06/23/2020   Procedure: CARDIOVERSION;  Surgeon: Hobart Powell BRAVO, MD;  Location: [facility] ENDOSCOPY;  Service: Cardiovascular;  Laterality: N/A;   CARDIOVERSION N/A 10/11/2020   Procedure: CARDIOVERSION;  Surgeon: Shuck Ezra GORMAN, MD;  Location: [facility] ENDOSCOPY;  Service: Cardiovascular;  Laterality: N/A;   CARPAL TUNNEL RELEASE     CATARACT EXTRACTION W/ INTRAOCULAR LENS  IMPLANT, BILATERAL Bilateral    COLON SURGERY  1990   vein graft and colon repair after nicked artery with back surgert   COLONOSCOPY     CORONARY ANGIOGRAPHY N/A 09/08/2020   Procedure: CORONARY ANGIOGRAPHY;  Surgeon: Shuck Ezra GORMAN, MD;  Location: [facility] INVASIVE CV LAB;  Service: Cardiovascular;  Laterality: N/A;   CYSTECTOMY     between bladder and kidneys   GANGLION CYST EXCISION Left    ICD IMPLANT N/A 09/15/2020   Procedure: ICD IMPLANT;  Surgeon: Inocencio Soyla Lunger, MD;  Location: [facility] INVASIVE CV LAB;  Service: Cardiovascular;  Laterality: N/A;   KNEE CARTILAGE SURGERY Right 1960s   LUMBAR DISC SURGERY  1990   REPAIR ILIAC ARTERY  1990   vein graft and colon repair after nicked artery with back surgert   TEE WITHOUT CARDIOVERSION N/A 10/31/2013   Procedure: TRANSESOPHAGEAL ECHOCARDIOGRAM (TEE);  Surgeon: Ezra GORMAN Shuck, MD;  Location: [facility] ENDOSCOPY;  Service: Cardiovascular;  Laterality: N/A;   TUBAL LIGATION      Family History  Problem Relation Age of Onset   Diabetes Mother    Hypertension Mother    Cancer Mother    Obesity Mother    Diabetes Father    Obesity Father    Diabetes Other    Melanoma Other    Factor V Leiden deficiency Daughter     Allergies  Allergen Reactions   Neomycin-Bacitracin Zn-Polymyx Rash   Sulfa Antibiotics Diarrhea and Nausea And Vomiting   Ciprofloxacin  Hives, Itching and Rash    Pt doesn't remember having any reaction to cipro    Spironolactone  Rash    Current Outpatient  Medications on File Prior to Visit  Medication Sig Dispense Refill   acetaminophen  (TYLENOL ) 500 MG tablet Take 500 mg by mouth every 6 (six) hours as needed for moderate pain (pain score 4-6) or headache.     amiodarone  (PACERONE ) 200 MG tablet TAKE 1 TABLET BY MOUTH EVERY DAY 90 tablet 3   BD PEN NEEDLE NANO 2ND GEN 32G X 4 MM MISC See admin instructions.     cefadroxil  (DURICEF) 500 MG capsule Take 1 capsule (500 mg total) by mouth 2 (two) times daily.     dapagliflozin  propanediol (FARXIGA ) 10 MG TABS tablet TAKE 1 TABLET BY MOUTH EVERY DAY BEFORE BREAKFAST 90 tablet 3   ELIQUIS  5 MG TABS tablet TAKE 1 TABLET BY MOUTH TWICE A DAY 60 tablet 11   ENTRESTO  24-26 MG Take 1 tablet by mouth 2 (two) times daily.     fenofibrate  (TRICOR ) 145 MG tablet TAKE 1 TABLET BY MOUTH EVERY DAY 90 tablet 3   furosemide  (LASIX ) 40 MG tablet Take 40 mg by mouth 2 (two) times daily. Take 2 tablets by mouth twice daily     gabapentin  (NEURONTIN ) 300 MG capsule Take 300 mg by mouth 2 (two) times daily as needed (pain).     HUMALOG  MIX 75/25 KWIKPEN (75-25) 100 UNIT/ML KwikPen Take 10 units with morning meal and 10 units with evening meal     levothyroxine  (SYNTHROID ) 50 MCG tablet Take 1 tablet (50 mcg total) by mouth daily. 90 tablet 3   metoprolol  succinate (TOPROL -XL) 25 MG 24 hr tablet TAKE 1 TABLET BY MOUTH EVERYDAY AT BEDTIME 90 tablet 3   mexiletine (MEXITIL ) 150 MG capsule TAKE 2 CAPSULES (300 MG TOTAL) BY MOUTH 2 (TWO) TIMES DAILY. 360 capsule 3   MOUNJARO 2.5 MG/0.5ML Pen Inject 2.5 mg into the skin once a week. Thursdays     Multiple Vitamin (MULTIVITAMIN WITH MINERALS) TABS tablet Take 1 tablet by mouth daily.     ONETOUCH VERIO test strip 1 each 3 (three) times daily.     Pitavastatin  Calcium  4 MG TABS TAKE 1 TABLET BY MOUTH EVERY DAY 90 tablet 1   potassium chloride  (MICRO-K ) 10 MEQ CR capsule  Take 1 capsule (10 mEq total) by mouth 2 (two) times daily. 60 capsule 3   sertraline  (ZOLOFT ) 25 MG tablet  TAKE 1 TABLET (25 MG TOTAL) BY MOUTH DAILY. 90 tablet 2   VASCEPA  1 g capsule TAKE 2 CAPSULES BY MOUTH TWICE A DAY 120 capsule 5   No current facility-administered medications on file prior to visit.    BP 122/80   Pulse 70   Temp 97.8 F (36.6 C) (Oral)   Resp 16   Ht 5' 6 (1.676 m)   SpO2 98%   BMI 41.03 kg/m        Objective:   Physical Exam  General Mental Status- Alert. General Appearance- Not in acute distress.   Skin General: Color- Normal Color. Moisture- Normal Moisture.  Neck  No JVD.  Chest and Lung Exam Auscultation: Breath Sounds:-Normal.  Cardiovascular Auscultation:Rythm- Regular. Murmurs & Other Heart Sounds:Auscultation of the heart reveals- No Murmurs.  Abdomen Inspection:-Inspeection Normal. Palpation/Percussion:Note:No mass. Palpation and Percussion of the abdomen reveal- Non Tender, Non Distended + BS, no rebound or guarding.   Neurologic Cranial Nerve exam:- CN III-XII intact(No nystagmus), symmetric smile. tric strength both upper and lower extremities.    Lower ext- calfs symmetric, no pedal edema, negative homans signs.  Rt elbow- mild enlarged bursa. Faint pink over elbow. Not warm, no red and not tender. Good rom of elbow with no pain.    Assessment & Plan:   Patient Instructions  Nausea and Vomiting(minimal vomiting twice past week) Nausea and vomiting postprandial for one week. Suspected mild dehydration. Differential includes reflux or infection-related nausea. (possible pancreatitis) - Prescribed Zofran  every eight hours, 30 minutes before meals, for two days. - Prescribed famotidine  once daily. - Advised to avoid greasy and fried foods. - Encouraged hydration with Propel or sugar-free Gatorade. - Ordered CBC, metabolic panel, lipase, sed rate, and C-reactive protein.  Right Elbow Bursitis and Cellulitis recently but not convince infection presently. Right elbow bursitis and cellulitis with possible ongoing low-level  infection. - Ordered right elbow x-ray. - Monitor CBC for infection. Sed rate and crp  Diabetes Mellitus Type 2 Type 2 Diabetes Mellitus with well-controlled blood sugar. Recent reading 110 mg/dL. - Ordered A1c test.  On review stable chf. No leg swelling.  Follow up date to be determined after lab and imaging review.(if not back to baseline want you to see pcp/Dr. Antonio)   Edinson Domeier, PA-C   Time spent with patient today was 43  minutes which consisted of chart revdiew, discussing diagnosis, work up treatment and documentation.

## 2024-01-30 NOTE — Patient Instructions (Signed)
 Nausea and Vomiting(minimal vomiting twice past week) Nausea and vomiting postprandial for one week. Suspected mild dehydration. Differential includes reflux or infection-related nausea. (possible pancreatitis) - Prescribed Zofran  every eight hours, 30 minutes before meals, for two days. - Prescribed famotidine  once daily. - Advised to avoid greasy and fried foods. - Encouraged hydration with Propel or sugar-free Gatorade. - Ordered CBC, metabolic panel, lipase, sed rate, and C-reactive protein.  Right Elbow Bursitis and Cellulitis recently but not convince infection presently. Right elbow bursitis and cellulitis with possible ongoing low-level infection. - Ordered right elbow x-ray. - Monitor CBC for infection. Sed rate and crp  Diabetes Mellitus Type 2 Type 2 Diabetes Mellitus with well-controlled blood sugar. Recent reading 110 mg/dL. - Ordered A1c test.  On review stable chf. No leg swelling.  Follow up date to be determined after lab and imaging review.(if not back to baseline want you to see pcp/Dr. Antonio)

## 2024-01-31 ENCOUNTER — Ambulatory Visit: Payer: Self-pay | Admitting: Medical

## 2024-01-31 LAB — HEMOGLOBIN A1C: Hgb A1c MFr Bld: 6.2 % (ref 4.6–6.5)

## 2024-01-31 LAB — C-REACTIVE PROTEIN: CRP: <1 mg/dL (ref 0.5–20.0)

## 2024-01-31 LAB — SEDIMENTATION RATE: Sed Rate: 6 mm/h (ref 0–30)

## 2024-01-31 NOTE — Addendum Note (Signed)
 Addended by: DORINA DALLAS HERO on: 01/31/2024 06:35 AM   Modules accepted: Orders

## 2024-02-06 ENCOUNTER — Other Ambulatory Visit (HOSPITAL_BASED_OUTPATIENT_CLINIC_OR_DEPARTMENT_OTHER)

## 2024-02-07 ENCOUNTER — Ambulatory Visit (HOSPITAL_BASED_OUTPATIENT_CLINIC_OR_DEPARTMENT_OTHER)
Admission: RE | Admit: 2024-02-07 | Discharge: 2024-02-07 | Disposition: A | Discharge status: Home / Self Care | Source: Ambulatory Visit | Attending: Medical | Admitting: Medical

## 2024-02-07 DIAGNOSIS — R7989 Other specified abnormal findings of blood chemistry: Secondary | ICD-10-CM | POA: Insufficient documentation

## 2024-02-07 DIAGNOSIS — K7689 Other specified diseases of liver: Secondary | ICD-10-CM | POA: Diagnosis not present

## 2024-02-07 DIAGNOSIS — K8000 Calculus of gallbladder with acute cholecystitis without obstruction: Secondary | ICD-10-CM | POA: Diagnosis not present

## 2024-02-07 DIAGNOSIS — R112 Nausea with vomiting, unspecified: Secondary | ICD-10-CM | POA: Diagnosis not present

## 2024-02-07 DIAGNOSIS — K8020 Calculus of gallbladder without cholecystitis without obstruction: Secondary | ICD-10-CM | POA: Diagnosis not present

## 2024-02-08 DIAGNOSIS — Z7901 Long term (current) use of anticoagulants: Secondary | ICD-10-CM | POA: Diagnosis not present

## 2024-02-08 DIAGNOSIS — E785 Hyperlipidemia, unspecified: Secondary | ICD-10-CM | POA: Diagnosis not present

## 2024-02-08 DIAGNOSIS — Z794 Long term (current) use of insulin: Secondary | ICD-10-CM | POA: Diagnosis not present

## 2024-02-08 DIAGNOSIS — I130 Hypertensive heart and chronic kidney disease with heart failure and stage 1 through stage 4 chronic kidney disease, or unspecified chronic kidney disease: Secondary | ICD-10-CM | POA: Diagnosis not present

## 2024-02-08 DIAGNOSIS — M19032 Primary osteoarthritis, left wrist: Secondary | ICD-10-CM | POA: Diagnosis not present

## 2024-02-08 DIAGNOSIS — M19041 Primary osteoarthritis, right hand: Secondary | ICD-10-CM | POA: Diagnosis not present

## 2024-02-08 DIAGNOSIS — Z7984 Long term (current) use of oral hypoglycemic drugs: Secondary | ICD-10-CM | POA: Diagnosis not present

## 2024-02-08 DIAGNOSIS — E1151 Type 2 diabetes mellitus with diabetic peripheral angiopathy without gangrene: Secondary | ICD-10-CM | POA: Diagnosis not present

## 2024-02-08 DIAGNOSIS — I4892 Unspecified atrial flutter: Secondary | ICD-10-CM | POA: Diagnosis not present

## 2024-02-08 DIAGNOSIS — E1122 Type 2 diabetes mellitus with diabetic chronic kidney disease: Secondary | ICD-10-CM | POA: Diagnosis not present

## 2024-02-08 DIAGNOSIS — M19031 Primary osteoarthritis, right wrist: Secondary | ICD-10-CM | POA: Diagnosis not present

## 2024-02-08 DIAGNOSIS — I472 Ventricular tachycardia, unspecified: Secondary | ICD-10-CM | POA: Diagnosis not present

## 2024-02-08 DIAGNOSIS — G8929 Other chronic pain: Secondary | ICD-10-CM | POA: Diagnosis not present

## 2024-02-08 DIAGNOSIS — G4733 Obstructive sleep apnea (adult) (pediatric): Secondary | ICD-10-CM | POA: Diagnosis not present

## 2024-02-08 DIAGNOSIS — I5042 Chronic combined systolic (congestive) and diastolic (congestive) heart failure: Secondary | ICD-10-CM | POA: Diagnosis not present

## 2024-02-08 DIAGNOSIS — N183 Chronic kidney disease, stage 3 unspecified: Secondary | ICD-10-CM | POA: Diagnosis not present

## 2024-02-08 DIAGNOSIS — M19042 Primary osteoarthritis, left hand: Secondary | ICD-10-CM | POA: Diagnosis not present

## 2024-02-08 DIAGNOSIS — I4819 Other persistent atrial fibrillation: Secondary | ICD-10-CM | POA: Diagnosis not present

## 2024-02-08 DIAGNOSIS — M545 Low back pain, unspecified: Secondary | ICD-10-CM | POA: Diagnosis not present

## 2024-02-08 DIAGNOSIS — M25461 Effusion, right knee: Secondary | ICD-10-CM | POA: Diagnosis not present

## 2024-02-08 DIAGNOSIS — E039 Hypothyroidism, unspecified: Secondary | ICD-10-CM | POA: Diagnosis not present

## 2024-02-08 DIAGNOSIS — J449 Chronic obstructive pulmonary disease, unspecified: Secondary | ICD-10-CM | POA: Diagnosis not present

## 2024-02-14 ENCOUNTER — Other Ambulatory Visit: Payer: Self-pay | Admitting: Medical

## 2024-02-18 ENCOUNTER — Other Ambulatory Visit: Payer: Self-pay | Admitting: Medical

## 2024-02-19 DIAGNOSIS — M19032 Primary osteoarthritis, left wrist: Secondary | ICD-10-CM | POA: Diagnosis not present

## 2024-02-19 DIAGNOSIS — J449 Chronic obstructive pulmonary disease, unspecified: Secondary | ICD-10-CM | POA: Diagnosis not present

## 2024-02-19 DIAGNOSIS — I5042 Chronic combined systolic (congestive) and diastolic (congestive) heart failure: Secondary | ICD-10-CM | POA: Diagnosis not present

## 2024-02-19 DIAGNOSIS — G8929 Other chronic pain: Secondary | ICD-10-CM | POA: Diagnosis not present

## 2024-02-19 DIAGNOSIS — M7021 Olecranon bursitis, right elbow: Secondary | ICD-10-CM | POA: Diagnosis not present

## 2024-02-19 DIAGNOSIS — I130 Hypertensive heart and chronic kidney disease with heart failure and stage 1 through stage 4 chronic kidney disease, or unspecified chronic kidney disease: Secondary | ICD-10-CM | POA: Diagnosis not present

## 2024-02-19 DIAGNOSIS — E1151 Type 2 diabetes mellitus with diabetic peripheral angiopathy without gangrene: Secondary | ICD-10-CM | POA: Diagnosis not present

## 2024-02-19 DIAGNOSIS — E039 Hypothyroidism, unspecified: Secondary | ICD-10-CM | POA: Diagnosis not present

## 2024-02-19 DIAGNOSIS — N183 Chronic kidney disease, stage 3 unspecified: Secondary | ICD-10-CM | POA: Diagnosis not present

## 2024-02-19 DIAGNOSIS — G4733 Obstructive sleep apnea (adult) (pediatric): Secondary | ICD-10-CM | POA: Diagnosis not present

## 2024-02-19 DIAGNOSIS — E785 Hyperlipidemia, unspecified: Secondary | ICD-10-CM | POA: Diagnosis not present

## 2024-02-19 DIAGNOSIS — I472 Ventricular tachycardia, unspecified: Secondary | ICD-10-CM | POA: Diagnosis not present

## 2024-02-19 DIAGNOSIS — Z7984 Long term (current) use of oral hypoglycemic drugs: Secondary | ICD-10-CM | POA: Diagnosis not present

## 2024-02-19 DIAGNOSIS — I480 Paroxysmal atrial fibrillation: Secondary | ICD-10-CM | POA: Diagnosis not present

## 2024-02-19 DIAGNOSIS — M19042 Primary osteoarthritis, left hand: Secondary | ICD-10-CM | POA: Diagnosis not present

## 2024-02-19 DIAGNOSIS — M25461 Effusion, right knee: Secondary | ICD-10-CM | POA: Diagnosis not present

## 2024-02-19 DIAGNOSIS — E1122 Type 2 diabetes mellitus with diabetic chronic kidney disease: Secondary | ICD-10-CM | POA: Diagnosis not present

## 2024-02-19 DIAGNOSIS — M19041 Primary osteoarthritis, right hand: Secondary | ICD-10-CM | POA: Diagnosis not present

## 2024-02-19 DIAGNOSIS — Z7901 Long term (current) use of anticoagulants: Secondary | ICD-10-CM | POA: Diagnosis not present

## 2024-02-19 DIAGNOSIS — M5126 Other intervertebral disc displacement, lumbar region: Secondary | ICD-10-CM | POA: Diagnosis not present

## 2024-02-19 DIAGNOSIS — M19031 Primary osteoarthritis, right wrist: Secondary | ICD-10-CM | POA: Diagnosis not present

## 2024-02-19 DIAGNOSIS — I4892 Unspecified atrial flutter: Secondary | ICD-10-CM | POA: Diagnosis not present

## 2024-02-21 DIAGNOSIS — E1122 Type 2 diabetes mellitus with diabetic chronic kidney disease: Secondary | ICD-10-CM | POA: Diagnosis not present

## 2024-02-28 DIAGNOSIS — E039 Hypothyroidism, unspecified: Secondary | ICD-10-CM | POA: Diagnosis not present

## 2024-02-28 DIAGNOSIS — I4892 Unspecified atrial flutter: Secondary | ICD-10-CM | POA: Diagnosis not present

## 2024-02-28 DIAGNOSIS — N183 Chronic kidney disease, stage 3 unspecified: Secondary | ICD-10-CM | POA: Diagnosis not present

## 2024-02-28 DIAGNOSIS — G8929 Other chronic pain: Secondary | ICD-10-CM | POA: Diagnosis not present

## 2024-02-28 DIAGNOSIS — M19042 Primary osteoarthritis, left hand: Secondary | ICD-10-CM | POA: Diagnosis not present

## 2024-02-28 DIAGNOSIS — I480 Paroxysmal atrial fibrillation: Secondary | ICD-10-CM | POA: Diagnosis not present

## 2024-02-28 DIAGNOSIS — M25461 Effusion, right knee: Secondary | ICD-10-CM | POA: Diagnosis not present

## 2024-02-28 DIAGNOSIS — M7021 Olecranon bursitis, right elbow: Secondary | ICD-10-CM | POA: Diagnosis not present

## 2024-02-28 DIAGNOSIS — I130 Hypertensive heart and chronic kidney disease with heart failure and stage 1 through stage 4 chronic kidney disease, or unspecified chronic kidney disease: Secondary | ICD-10-CM | POA: Diagnosis not present

## 2024-02-28 DIAGNOSIS — J449 Chronic obstructive pulmonary disease, unspecified: Secondary | ICD-10-CM | POA: Diagnosis not present

## 2024-02-28 DIAGNOSIS — I472 Ventricular tachycardia, unspecified: Secondary | ICD-10-CM | POA: Diagnosis not present

## 2024-02-28 DIAGNOSIS — E1151 Type 2 diabetes mellitus with diabetic peripheral angiopathy without gangrene: Secondary | ICD-10-CM | POA: Diagnosis not present

## 2024-02-28 DIAGNOSIS — M19041 Primary osteoarthritis, right hand: Secondary | ICD-10-CM | POA: Diagnosis not present

## 2024-02-28 DIAGNOSIS — I5042 Chronic combined systolic (congestive) and diastolic (congestive) heart failure: Secondary | ICD-10-CM | POA: Diagnosis not present

## 2024-02-28 DIAGNOSIS — E1122 Type 2 diabetes mellitus with diabetic chronic kidney disease: Secondary | ICD-10-CM | POA: Diagnosis not present

## 2024-02-28 DIAGNOSIS — G4733 Obstructive sleep apnea (adult) (pediatric): Secondary | ICD-10-CM | POA: Diagnosis not present

## 2024-02-28 DIAGNOSIS — Z7984 Long term (current) use of oral hypoglycemic drugs: Secondary | ICD-10-CM | POA: Diagnosis not present

## 2024-02-28 DIAGNOSIS — Z7901 Long term (current) use of anticoagulants: Secondary | ICD-10-CM | POA: Diagnosis not present

## 2024-02-28 DIAGNOSIS — M19031 Primary osteoarthritis, right wrist: Secondary | ICD-10-CM | POA: Diagnosis not present

## 2024-02-28 DIAGNOSIS — E785 Hyperlipidemia, unspecified: Secondary | ICD-10-CM | POA: Diagnosis not present

## 2024-02-28 DIAGNOSIS — M5126 Other intervertebral disc displacement, lumbar region: Secondary | ICD-10-CM | POA: Diagnosis not present

## 2024-02-29 ENCOUNTER — Emergency Department (HOSPITAL_COMMUNITY)

## 2024-02-29 ENCOUNTER — Emergency Department (HOSPITAL_COMMUNITY)
Admission: EM | Admit: 2024-02-29 | Discharge: 2024-02-29 | Disposition: A | Discharge status: Home / Self Care | Attending: Emergency Medicine | Admitting: Emergency Medicine

## 2024-02-29 ENCOUNTER — Encounter (HOSPITAL_COMMUNITY): Payer: Self-pay

## 2024-02-29 ENCOUNTER — Other Ambulatory Visit: Payer: Self-pay

## 2024-02-29 DIAGNOSIS — Z950 Presence of cardiac pacemaker: Secondary | ICD-10-CM | POA: Diagnosis not present

## 2024-02-29 DIAGNOSIS — R5383 Other fatigue: Secondary | ICD-10-CM | POA: Diagnosis present

## 2024-02-29 DIAGNOSIS — I509 Heart failure, unspecified: Secondary | ICD-10-CM | POA: Diagnosis not present

## 2024-02-29 DIAGNOSIS — R531 Weakness: Secondary | ICD-10-CM | POA: Insufficient documentation

## 2024-02-29 DIAGNOSIS — R110 Nausea: Secondary | ICD-10-CM | POA: Diagnosis not present

## 2024-02-29 DIAGNOSIS — M7989 Other specified soft tissue disorders: Secondary | ICD-10-CM | POA: Diagnosis not present

## 2024-02-29 DIAGNOSIS — E7800 Pure hypercholesterolemia, unspecified: Secondary | ICD-10-CM | POA: Insufficient documentation

## 2024-02-29 DIAGNOSIS — I499 Cardiac arrhythmia, unspecified: Secondary | ICD-10-CM | POA: Diagnosis not present

## 2024-02-29 DIAGNOSIS — I4891 Unspecified atrial fibrillation: Secondary | ICD-10-CM | POA: Diagnosis not present

## 2024-02-29 DIAGNOSIS — I110 Hypertensive heart disease with heart failure: Secondary | ICD-10-CM | POA: Diagnosis not present

## 2024-02-29 DIAGNOSIS — Z794 Long term (current) use of insulin: Secondary | ICD-10-CM | POA: Diagnosis not present

## 2024-02-29 DIAGNOSIS — Z7901 Long term (current) use of anticoagulants: Secondary | ICD-10-CM | POA: Insufficient documentation

## 2024-02-29 DIAGNOSIS — Z86718 Personal history of other venous thrombosis and embolism: Secondary | ICD-10-CM | POA: Insufficient documentation

## 2024-02-29 DIAGNOSIS — R1111 Vomiting without nausea: Secondary | ICD-10-CM | POA: Diagnosis not present

## 2024-02-29 DIAGNOSIS — I517 Cardiomegaly: Secondary | ICD-10-CM | POA: Diagnosis not present

## 2024-02-29 LAB — COMPREHENSIVE METABOLIC PANEL WITH GFR
ALT: 21 U/L (ref 0–44)
AST: 58 U/L — ABNORMAL HIGH (ref 15–41)
Albumin: 3.1 g/dL — ABNORMAL LOW (ref 3.5–5.0)
Alkaline Phosphatase: 45 U/L (ref 38–126)
Anion gap: 12 (ref 5–15)
BUN: 14 mg/dL (ref 8–23)
CO2: 26 mmol/L (ref 22–32)
Calcium: 8 mg/dL — ABNORMAL LOW (ref 8.9–10.3)
Chloride: 100 mmol/L (ref 98–111)
Creatinine, Ser: 0.82 mg/dL (ref 0.44–1.00)
GFR, Estimated: >60 mL/min (ref >60)
Glucose, Bld: 142 mg/dL — ABNORMAL HIGH (ref 70–99)
Potassium: 2.9 mmol/L — ABNORMAL LOW (ref 3.5–5.1)
Sodium: 138 mmol/L (ref 135–145)
Total Bilirubin: 0.9 mg/dL (ref 0.0–1.2)
Total Protein: 6.9 g/dL (ref 6.5–8.1)

## 2024-02-29 LAB — CBC
HCT: 50.5 % — ABNORMAL HIGH (ref 36.0–46.0)
Hemoglobin: 15.9 g/dL — ABNORMAL HIGH (ref 12.0–15.0)
MCH: 29.6 pg (ref 26.0–34.0)
MCHC: 31.5 g/dL (ref 30.0–36.0)
MCV: 94 fL (ref 80.0–100.0)
Platelets: 167 K/uL (ref 150–400)
RBC: 5.37 MIL/uL — ABNORMAL HIGH (ref 3.87–5.11)
RDW: 15.3 % (ref 11.5–15.5)
WBC: 7.8 K/uL (ref 4.0–10.5)
nRBC: 0 % (ref 0.0–0.2)

## 2024-02-29 LAB — RESP PANEL BY RT-PCR (RSV, FLU A&B, COVID)  RVPGX2
Influenza A by PCR: NEGATIVE
Influenza B by PCR: NEGATIVE
Resp Syncytial Virus by PCR: NEGATIVE
SARS Coronavirus 2 by RT PCR: NEGATIVE

## 2024-02-29 LAB — BRAIN NATRIURETIC PEPTIDE: B Natriuretic Peptide: 240 pg/mL — ABNORMAL HIGH (ref 0.0–100.0)

## 2024-02-29 LAB — CBG MONITORING, ED: Glucose-Capillary: 111 mg/dL — ABNORMAL HIGH (ref 70–99)

## 2024-02-29 LAB — TROPONIN I (HIGH SENSITIVITY)
Troponin I (High Sensitivity): 19 ng/L — ABNORMAL HIGH (ref <18)
Troponin I (High Sensitivity): 19 ng/L — ABNORMAL HIGH (ref <18)

## 2024-02-29 MED ORDER — POTASSIUM CHLORIDE 10 MEQ/100ML IV SOLN
10.0000 meq | Freq: Once | INTRAVENOUS | Status: AC
  Administered 2024-02-29: 10 meq via INTRAVENOUS

## 2024-02-29 MED ORDER — POTASSIUM CHLORIDE CRYS ER 20 MEQ PO TBCR
40.0000 meq | EXTENDED_RELEASE_TABLET | Freq: Once | ORAL | Status: AC
  Administered 2024-02-29: 40 meq via ORAL
  Filled 2024-02-29: qty 2

## 2024-02-29 MED ORDER — POTASSIUM CHLORIDE 10 MEQ/100ML IV SOLN
10.0000 meq | INTRAVENOUS | Status: DC
  Filled 2024-02-29 (×2): qty 100

## 2024-02-29 NOTE — Discharge Instructions (Signed)
 Continue to stay well-hydrated.  Follow-up with cardiology they should be calling you for a follow-up appointment.  Follow-up with your primary care doctor.  You can follow-up your COVID test on your MyChart.  That results should come back by tomorrow morning.

## 2024-02-29 NOTE — ED Provider Triage Note (Signed)
 Emergency Medicine Provider Triage Evaluation Note  Heidi Stark , a 83 y.o. female  was evaluated in triage.  Pt complains of weakness. Report she felt weak, nauseous and felt horrible throughout the day today.  Denies fever, chills, headache, cp, sob, cough, cold sxs or dysuria.  Also notice some bumps that appears on the LLE today which is nontender.  No recent medication changes  Review of Systems  Positive: As above Negative: As above  Physical Exam  BP (!) 143/91 (BP Location: Right Arm)   Pulse 72   Temp 98 F (36.7 C) (Oral)   Resp 18   Ht 5' 6 (1.676 m)   Wt 113.4 kg   SpO2 99%   BMI 40.35 kg/m  Gen:   Awake, no distress   Resp:  Normal effort  MSK:   Moves extremities without difficulty  Other:    Medical Decision Making  Medically screening exam initiated at 5:43 PM.  Appropriate orders placed.  SHEILA OCASIO was informed that the remainder of the evaluation will be completed by another provider, this initial triage assessment does not replace that evaluation, and the importance of remaining in the ED until their evaluation is complete.     Nivia Colon, PA-C 02/29/24 1744

## 2024-02-29 NOTE — ED Provider Notes (Signed)
 [place] EMERGENCY DEPARTMENT AT [facility] Provider Note   CSN: [id number] Arrival date & time: 02/29/24  1659     Patient presents with: Fatigue   Heidi Stark is a 83 y.o. female.   Patient here with generalized weakness here today worse in the last few days.  She got a little bit of redness of both lower legs.  She has history of CHF A-fib blood clot on anticoagulation status post pacemaker.  History of hypertension high cholesterol.  She denies any pain urination.  Denies any cough sputum production.  Denies any headache falls.  She just been feeling very weak generally today.  Denies any obvious viral symptoms or fever.  The history is provided by the patient.       Prior to Admission medications   Medication Sig Start Date End Date Taking? Authorizing Provider  acetaminophen  (TYLENOL ) 500 MG tablet Take 500 mg by mouth every 6 (six) hours as needed for moderate pain (pain score 4-6) or headache.    [provider]  amiodarone  (PACERONE ) 200 MG tablet TAKE 1 TABLET BY MOUTH EVERY DAY 08/23/23   Rolan Ezra RAMAN, MD  BD PEN NEEDLE NANO 2ND GEN 32G X 4 MM MISC See admin instructions. 09/24/21   [provider]  cefadroxil  (DURICEF) 500 MG capsule Take 1 capsule (500 mg total) by mouth 2 (two) times daily. 12/27/23   Rojelio Nest, DO  dapagliflozin  propanediol (FARXIGA ) 10 MG TABS tablet TAKE 1 TABLET BY MOUTH EVERY DAY BEFORE BREAKFAST 05/31/22   Antonio Meth, Yvonne R, DO  ELIQUIS  5 MG TABS tablet TAKE 1 TABLET BY MOUTH TWICE A DAY 06/12/23   Rolan Ezra RAMAN, MD  ENTRESTO  24-26 MG Take 1 tablet by mouth 2 (two) times daily. 10/15/23   [provider]  famotidine  (PEPCID ) 20 MG tablet TAKE 1 TABLET BY MOUTH EVERY DAY 02/18/24   Antonio Meth, Yvonne R, DO  fenofibrate  (TRICOR ) 145 MG tablet TAKE 1 TABLET BY MOUTH EVERY DAY 01/22/23   Rolan Ezra RAMAN, MD  furosemide  (LASIX ) 40 MG tablet Take 40 mg by mouth 2 (two) times daily. Take 2 tablets by  mouth twice daily 11/15/23   [provider]  gabapentin  (NEURONTIN ) 300 MG capsule Take 300 mg by mouth 2 (two) times daily as needed (pain). 07/31/22   [provider]  HUMALOG  MIX 75/25 KWIKPEN (75-25) 100 UNIT/ML KwikPen Take 10 units with morning meal and 10 units with evening meal 12/27/23   Rojelio Nest, DO  levothyroxine  (SYNTHROID ) 50 MCG tablet Take 1 tablet (50 mcg total) by mouth daily. 12/03/23   Antonio Meth Jamee JONELLE, DO  metoprolol  succinate (TOPROL -XL) 25 MG 24 hr tablet TAKE 1 TABLET BY MOUTH EVERYDAY AT BEDTIME 04/23/23   Hayes Beckey CROME, NP  mexiletine (MEXITIL ) 150 MG capsule TAKE 2 CAPSULES (300 MG TOTAL) BY MOUTH 2 (TWO) TIMES DAILY. 05/10/23   Camnitz, Soyla Lunger, MD  MOUNJARO 2.5 MG/0.5ML Pen Inject 2.5 mg into the skin once a week. Thursdays    [provider]  Multiple Vitamin (MULTIVITAMIN WITH MINERALS) TABS tablet Take 1 tablet by mouth daily.    [provider]  ondansetron  (ZOFRAN ) 4 MG tablet TAKE 1 TABLET BY MOUTH EVERY 8 HOURS AS NEEDED FOR NAUSEA AND VOMITING 02/14/24   Antonio Meth, Yvonne R, DO  ONETOUCH VERIO test strip 1 each 3 (three) times daily. 10/09/21   [provider]  Pitavastatin  Calcium  4 MG TABS TAKE 1 TABLET BY MOUTH  EVERY DAY 10/29/23   Rolan Ezra RAMAN, MD  potassium chloride  (MICRO-K ) 10 MEQ CR capsule Take 1 capsule (10 mEq total) by mouth 2 (two) times daily. 01/07/24   Bensimhon, Toribio SAUNDERS, MD  sertraline  (ZOLOFT ) 25 MG tablet TAKE 1 TABLET (25 MG TOTAL) BY MOUTH DAILY. 10/15/23   Antonio Cyndee Rockers R, DO  VASCEPA  1 g capsule TAKE 2 CAPSULES BY MOUTH TWICE A DAY 10/10/23   Rolan Ezra RAMAN, MD    Allergies: Neomycin-bacitracin zn-polymyx, Sulfa antibiotics, Ciprofloxacin , and Spironolactone     Review of Systems  Updated Vital Signs BP 121/88   Pulse 73   Temp 98 F (36.7 C) (Oral)   Resp 15   Ht 5' 6 (1.676 m)   Wt 113.4 kg   SpO2 93%   BMI 40.35 kg/m   Physical Exam Vitals and nursing note  reviewed.  Constitutional:      General: She is not in acute distress.    Appearance: She is well-developed. She is not ill-appearing.  HENT:     Head: Normocephalic and atraumatic.     Nose: Nose normal.     Mouth/Throat:     Mouth: Mucous membranes are moist.  Eyes:     Extraocular Movements: Extraocular movements intact.     Conjunctiva/sclera: Conjunctivae normal.     Pupils: Pupils are equal, round, and reactive to light.  Cardiovascular:     Rate and Rhythm: Normal rate and regular rhythm.     Pulses: Normal pulses.     Heart sounds: Normal heart sounds. No murmur heard. Pulmonary:     Effort: Pulmonary effort is normal. No respiratory distress.     Breath sounds: Normal breath sounds.  Abdominal:     Palpations: Abdomen is soft.     Tenderness: There is no abdominal tenderness.  Musculoskeletal:        General: No swelling.     Cervical back: Normal range of motion and neck supple.  Skin:    General: Skin is warm and dry.     Capillary Refill: Capillary refill takes less than 2 seconds.     Comments: Chronic venous stasis changes to bilateral lower leg, little bit of blister/water pocket on the left shin  Neurological:     General: No focal deficit present.     Mental Status: She is alert and oriented to person, place, and time.     Cranial Nerves: No cranial nerve deficit.     Sensory: No sensory deficit.     Motor: No weakness.     Coordination: Coordination normal.     Comments: Normal strength normal sensation normal speech   Psychiatric:        Mood and Affect: Mood normal.     (all labs ordered are listed, but only abnormal results are displayed) Labs Reviewed  COMPREHENSIVE METABOLIC PANEL WITH GFR - Abnormal; Notable for the following components:      Result Value   Potassium 2.9 (*)    Glucose, Bld 142 (*)    Calcium  8.0 (*)    Albumin 3.1 (*)    AST 58 (*)    All other components within normal limits  CBC - Abnormal; Notable for the following  components:   RBC 5.37 (*)    Hemoglobin 15.9 (*)    HCT 50.5 (*)    All other components within normal limits  BRAIN NATRIURETIC PEPTIDE - Abnormal; Notable for the following components:   B Natriuretic Peptide 240.0 (*)  All other components within normal limits  CBG MONITORING, ED - Abnormal; Notable for the following components:   Glucose-Capillary 111 (*)    All other components within normal limits  TROPONIN I (HIGH SENSITIVITY) - Abnormal; Notable for the following components:   Troponin I (High Sensitivity) 19 (*)    All other components within normal limits  TROPONIN I (HIGH SENSITIVITY) - Abnormal; Notable for the following components:   Troponin I (High Sensitivity) 19 (*)    All other components within normal limits  RESP PANEL BY RT-PCR (RSV, FLU A&B, COVID)  RVPGX2    EKG: EKG Interpretation Date/Time:  Friday February 29 2024 18:28:21 EDT Ventricular Rate:  75 PR Interval:    QRS Duration:  163 QT Interval:  460 QTC Calculation: 514 R Axis:   149  Text Interpretation: Atrial fibrillation RBBB and LPFB Borderline ST depression, lateral leads Confirmed by Ruthe Cornet [phone]) on 02/29/2024 6:32:59 PM  Radiology: [facility] Chest Portable 1 View Result Date: 02/29/2024 CLINICAL DATA:  Weakness EXAM: PORTABLE CHEST 1 VIEW COMPARISON:  Chest radiograph December 20, 2023 FINDINGS: Mild cardiomegaly stable to prior. Left chest wall ICD in place. No airspace opacity or pulmonary nodule. No pleural effusion or pneumothorax. No acute osseous abnormality. IMPRESSION: No acute cardiopulmonary disease. Electronically Signed   By: Megan  Zare M.D.   On: 02/29/2024 18:26     Procedures   Medications Ordered in the ED  potassium chloride  SA (KLOR-CON  M) CR tablet 40 mEq (40 mEq Oral Given 02/29/24 2027)  potassium chloride  10 mEq in 100 mL IVPB (0 mEq Intravenous Stopped 02/29/24 2239)                                    Medical Decision Making Amount and/or Complexity of Data  Reviewed Labs: ordered. Radiology: ordered.  Risk Prescription drug management.   Heidi Stark is here with generalized weakness.  History of hypertension high cholesterol DVT A-fib on anticoagulation.  Overall nonspecific symptoms.  Differential diagnosis could be cardiac related process or infectious related process or dehydration.  Will check basic labs BNP troponin chest x-ray COVID flu RSV test.  EKG showed rate controlled atrial fibrillation.  Pacemaker was interrogated which was overall unremarkable.  Concern for may be pending fracture of one of her leads.  I made cardiology team aware of this.  They will arrange for close follow-up.  But troponins were normal.  BMP unremarkable.  No signs of pneumonia on chest x-ray.  No significant leukocytosis anemia.  Potassium was 2.9.  This was repleted.  Overall I have no concern for stroke or any other acute process.  She stable for discharge.  Will follow-up with cardiology regarding her pacemaker and follow-up with primary care.  Discharge.  She was not having any urinary symptoms.  She did not want to stay any longer to give us  a urine sample.  Patient discharged.    This chart was dictated using voice recognition software.  Despite best efforts to proofread,  errors can occur which can change the documentation meaning.      Final diagnoses:  Weakness    ED Discharge Orders     None          Ruthe Cornet, DO 02/29/24 2321

## 2024-02-29 NOTE — ED Triage Notes (Signed)
 Pt BIB [facility] from home C/O generalized weakness X 3 days. Denies CP, SOB.   4 mg Zofran  250 mL NS

## 2024-03-03 ENCOUNTER — Telehealth: Payer: Self-pay | Admitting: *Deleted

## 2024-03-03 NOTE — Telephone Encounter (Signed)
-----   Message from Scot Ford sent at 02/29/2024  8:33 PM EDT ----- Regarding: ICD interrogation Called by ED physician, apparently patient presented to ED with weakness. Potassium very low. [employer] ICD interrogated in the ED, however showed possible fracture of lead. Patient is not pacer dependent. Case discussed with Dr. Jeffrie, need outpatient evaluation. Thank you  Hao

## 2024-03-03 NOTE — Telephone Encounter (Signed)
 Reviewed the patient's chart.  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   When seen in the office on 11/26/23: RV pacing impedence:  2014 ohms RV shock impedence: 75 ohms RV threshold capture was 1.5V @ 1 ms.  Adaptive was turned off and reprogrammed outputs to 3 V at 1 ms per Dr. Inocencio.    Per Hospital Interrogation 02/29/24: RV pacing impendence: 2565 ohms RV shock impedence: 80 ohms RV threshold: 1.75V @ 1.0 ms  Patient is not dependent- single chamber ICD. Battery: 8.9 years.  Optivol has been elevated since ~ 02/08/24, but thoracic impedence appears to be approaching baseline at this time.   Will forward to Dr. Inocencio for further advisement and recommendations.  _____________________________________________________________________________

## 2024-03-03 NOTE — Telephone Encounter (Signed)
 Patient seen in the ER on 02/29/24 (weakness, hypokalemia).  Logan with [facility] contacted the office today to flag for concerns.  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   Currently RV impedence is at 2500ohms as of in ER check on 8/15.    Interrogation report received from [place], [facility] and uploaded to [employer].  [facility] suspects that shocking coil should function but do suggest escalated discussion around replacement.   Not dependent/VP 0.5%.   Flagging high priority for Dr. Inocencio review and plans for follow up.

## 2024-03-06 ENCOUNTER — Other Ambulatory Visit: Payer: Self-pay | Admitting: Cardiology

## 2024-03-06 ENCOUNTER — Other Ambulatory Visit: Payer: Self-pay | Admitting: Family Medicine

## 2024-03-06 DIAGNOSIS — E039 Hypothyroidism, unspecified: Secondary | ICD-10-CM | POA: Diagnosis not present

## 2024-03-07 ENCOUNTER — Ambulatory Visit: Payer: Self-pay

## 2024-03-07 ENCOUNTER — Encounter: Payer: Self-pay | Admitting: Family Medicine

## 2024-03-07 NOTE — Telephone Encounter (Signed)
 This encounter was created in error - please disregard.

## 2024-03-07 NOTE — Telephone Encounter (Signed)
 1st attempt to call patient, no answer, LVM to call back     Summary: nauseous,not able to sleep   Reason for Triage: nauseous,not able to sleep,has been up all night

## 2024-03-07 NOTE — Telephone Encounter (Signed)
 FYI Only or Action Required?: Action required by provider: medication refill request.  Patient was last seen in primary care on 01/30/2024 by Dorina Loving, PA-C.  Called Nurse Triage reporting Insomnia.  Symptoms began several weeks ago.  Interventions attempted: OTC medications: Melatonin.  Symptoms are: gradually worsening.  Triage Disposition: See PCP When Office is Open (Within 3 Days)  Patient/caregiver understands and will follow disposition?:      Reason for Disposition  MODERATE - SEVERE insomnia (e.g., awake most of night; lack of sleep interferes with ability to function during the day; interferes with work or school)  Answer Assessment - Initial Assessment Questions Patient also requesting medication that is not on her active med list. Requesting a refill on tramadol and ondansetron . She would like medications sent to pharmacy below.  [id number] [address], [place] - [address]  [address], [place] [address]     1. DESCRIPTION: Tell me about your sleeping problem. (e.g., waking frequently during night, sleeping during day and awake at night, trouble falling asleep) How bad is it?      Difficulty falling asleep when she does go to sleep she is back up in 30 minutes.  2. ONSET: How long have you been having trouble sleeping? (e.g., days, weeks, months; longstanding sleep problems)     2 weeks ago 3. DAYTIME SLEEP PATTERN: How much time do you spend sleeping or napping during the day?     Yes, due to being unable to sleep at night. One nap today for 30 mins.  4. STRESSORS: Is there anything that is making you feel stressed? Is there something that worries you?     Lost husband in September last year. She states this is not affecting symptoms.  5. PAIN: Do you have any pain that is keeping you awake? (e.g., back pain, joint pain) If Yes, ask How bad is the pain? (e.g., scale 0-10; mild, moderate, severe).     No 6.  CAFFEINE: Do you drink caffeinated beverages? If Yes, ask How much each day? (e.g., coffee, tea, colas)     4 caffeine drinks a day  7. MEDICINE CHANGE: Has there been any recent change in medicines? (e.g., new medicine started, stopped, or dose changed).      No 8. TREATMENT: What have you done so far to treat this sleep problem? (e.g., prescription or OTC sleep medicines, herbal or dietary supplements, cannabis, relaxation strategies)     Used melatonin, with no relief  9. OTHER SYMPTOMS: Do you have any other symptoms?  (e.g., difficulty breathing)       States legs feel restless and she has had some nausea on and off for the last 2 weeks.  Protocols used: Insomnia-A-AH

## 2024-03-10 ENCOUNTER — Encounter: Payer: Self-pay | Admitting: Family Medicine

## 2024-03-10 ENCOUNTER — Ambulatory Visit (INDEPENDENT_AMBULATORY_CARE_PROVIDER_SITE_OTHER): Admitting: Family Medicine

## 2024-03-10 VITALS — BP 128/88 | HR 74 | Temp 97.9°F | Resp 18 | Ht 66.0 in | Wt 248.0 lb

## 2024-03-10 DIAGNOSIS — R110 Nausea: Secondary | ICD-10-CM | POA: Diagnosis not present

## 2024-03-10 DIAGNOSIS — L03116 Cellulitis of left lower limb: Secondary | ICD-10-CM

## 2024-03-10 DIAGNOSIS — G4700 Insomnia, unspecified: Secondary | ICD-10-CM

## 2024-03-10 DIAGNOSIS — F418 Other specified anxiety disorders: Secondary | ICD-10-CM | POA: Diagnosis not present

## 2024-03-10 MED ORDER — FAMOTIDINE 20 MG PO TABS: 20.0000 mg | ORAL_TABLET | Freq: Every day | ORAL | 0 refills | Status: DC

## 2024-03-10 MED ORDER — SERTRALINE HCL 25 MG PO TABS
25.0000 mg | ORAL_TABLET | Freq: Every day | ORAL | 2 refills | Status: DC
Start: 2024-03-10 — End: 2024-03-10

## 2024-03-10 MED ORDER — SERTRALINE HCL 25 MG PO TABS: 25.0000 mg | ORAL_TABLET | Freq: Every day | ORAL | 3 refills | Status: DC

## 2024-03-10 MED ORDER — LORAZEPAM 0.5 MG PO TABS: 0.5000 mg | ORAL_TABLET | Freq: Every day | ORAL | 1 refills | Status: DC

## 2024-03-10 MED ORDER — SERTRALINE HCL 25 MG PO TABS: 25.0000 mg | ORAL_TABLET | Freq: Every day | ORAL | 2 refills | Status: DC

## 2024-03-10 MED ORDER — CEPHALEXIN 500 MG PO CAPS: 500.0000 mg | ORAL_CAPSULE | Freq: Two times a day (BID) | ORAL | 0 refills | Status: DC

## 2024-03-10 MED ORDER — ONDANSETRON HCL 8 MG PO TABS
8.0000 mg | ORAL_TABLET | Freq: Three times a day (TID) | ORAL | 0 refills | Status: AC | PRN
Start: 2024-03-10 — End: ?

## 2024-03-10 NOTE — Progress Notes (Signed)
 Subjective:    Patient ID: Heidi Stark, female    DOB: 08-04-1940, 83 y.o.   MRN: [id number]  Chief Complaint  Patient presents with   Insomnia    X2 weeks, pt states unable to settle down and states having some nausea. Pt states using her son's xanax and was able to sleep.     HPI Patient is in today for anxiety and insomnia.   Discussed the use of AI scribe software for clinical note transcription with the patient, who gave verbal consent to proceed.  History of Present Illness Heidi Stark is an 83 year old female with anxiety and nausea who presents with worsening anxiety and nausea.  She has been experiencing severe anxiety, which has worsened recently, manifesting as difficulty sleeping, feeling unsettled, and an inability to remain still, causing distress for her and her family. Her anxiety improved temporarily after speaking with a nurse on Friday, but she continues to struggle with sleep, staying awake until 4:15 AM before taking Xanax provided by her son. She has since reduced the dose to half a tablet when needed.  She has been experiencing nausea for the past two to three weeks, particularly after eating. She was prescribed ondansetron  for nausea, which she has been taking two tablets at a time for relief, although it does not completely alleviate her symptoms. She also ran out of famotidine , which was prescribed for nausea and heartburn, on Friday. She has been cautious with her diet to manage her symptoms.  Her past medical history includes diabetes and anxiety. She was previously prescribed Zoloft  25 mg for depression while in a rehab facility, which she continues to take. Her blood sugar levels have been stable, and she is under the care of a cardiologist and a diabetes specialist.  Significant life changes, including the sale of her home of 57 years and the approaching anniversary of her father's passing, may be contributing to her anxiety. She currently resides with her  husband in a townhome.  No stomach pain or burping. No nausea since Friday. She reports a dry, blister-like spot on her leg, which was evaluated in the ER. She is not allergic to penicillin.    Past Medical History:  Diagnosis Date   Arthritis    hands, wrists (11/07/2016)   CHF (congestive heart failure) (HCC)    Chronic lower back pain    Constipation    DVT (deep venous thrombosis) (HCC)    History of blood transfusion 1990   related to OR   History of kidney stones    Hyperlipidemia    Hypertension    Joint pain    Lower extremity edema    OSA on CPAP    Persistent atrial fibrillation (HCC)    Pneumonia    several times (11/07/2016)   Type II diabetes mellitus (HCC)     Past Surgical History:  Procedure Laterality Date   ATRIAL FIBRILLATION ABLATION N/A 11/07/2016   Procedure: Atrial Fibrillation Ablation;  Surgeon: Will Gladis Norton, MD;  Location: [facility];  Service: Cardiovascular;  Laterality: N/A;   BACK SURGERY     CARDIAC CATHETERIZATION     CARDIOVERSION N/A 08/11/2016   Procedure: CARDIOVERSION;  Surgeon: Ezra GORMAN Shuck, MD;  Location: [facility] ENDOSCOPY;  Service: Cardiovascular;  Laterality: N/A;   CARDIOVERSION N/A 12/03/2017   Procedure: CARDIOVERSION;  Surgeon: Shuck Ezra GORMAN, MD;  Location: [facility] ENDOSCOPY;  Service: Cardiovascular;  Laterality: N/A;   CARDIOVERSION N/A 05/02/2018   Procedure: CARDIOVERSION;  Surgeon: Mona Vinie BROCKS, MD;  Location: [facility] ENDOSCOPY;  Service: Cardiovascular;  Laterality: N/A;   CARDIOVERSION N/A 06/23/2020   Procedure: CARDIOVERSION;  Surgeon: Hobart Powell BRAVO, MD;  Location: [facility] ENDOSCOPY;  Service: Cardiovascular;  Laterality: N/A;   CARDIOVERSION N/A 10/11/2020   Procedure: CARDIOVERSION;  Surgeon: Rolan Ezra RAMAN, MD;  Location: [facility] ENDOSCOPY;  Service: Cardiovascular;  Laterality: N/A;   CARPAL TUNNEL RELEASE     CATARACT EXTRACTION W/ INTRAOCULAR LENS  IMPLANT, BILATERAL Bilateral    COLON SURGERY   1990   vein graft and colon repair after nicked artery with back surgert   COLONOSCOPY     CORONARY ANGIOGRAPHY N/A 09/08/2020   Procedure: CORONARY ANGIOGRAPHY;  Surgeon: Rolan Ezra RAMAN, MD;  Location: [facility];  Service: Cardiovascular;  Laterality: N/A;   CYSTECTOMY     between bladder and kidneys   GANGLION CYST EXCISION Left    ICD IMPLANT N/A 09/15/2020   Procedure: ICD IMPLANT;  Surgeon: Inocencio Soyla Lunger, MD;  Location: [facility];  Service: Cardiovascular;  Laterality: N/A;   KNEE CARTILAGE SURGERY Right 1960s   LUMBAR DISC SURGERY  1990   REPAIR ILIAC ARTERY  1990   vein graft and colon repair after nicked artery with back surgert   TEE WITHOUT CARDIOVERSION N/A 10/31/2013   Procedure: TRANSESOPHAGEAL ECHOCARDIOGRAM (TEE);  Surgeon: Ezra RAMAN Rolan, MD;  Location: [facility] ENDOSCOPY;  Service: Cardiovascular;  Laterality: N/A;   TUBAL LIGATION      Family History  Problem Relation Age of Onset   Diabetes Mother    Hypertension Mother    Cancer Mother    Obesity Mother    Diabetes Father    Obesity Father    Diabetes Other    Melanoma Other    Factor V Leiden deficiency Daughter     Social History   Socioeconomic History   Marital status: Married    Spouse name: [name]   Number of children: 2   Years of education: Not on file   Highest education level: Not on file  Occupational History   Occupation: Retired  Tobacco Use   Smoking status: Former    Current packs/day: 0.00    Average packs/day: 1 pack/day for 50.0 years (50.0 ttl pk-yrs)    Types: Cigarettes    Start date: 08/17/1970    Quit date: 08/17/2020    Years since quitting: 3.5   Smokeless tobacco: Never   Tobacco comments:    Quit 2022. [employer]  Vaping Use   Vaping status: Never Used  Substance and Sexual Activity   Alcohol use: No    Alcohol/week: 0.0 standard drinks of alcohol   Drug use: No   Sexual activity: Not on file  Other Topics Concern   Not on file  Social History  Narrative   Married x 67 years.    1 son and 1 daughter.   1 grandson.   Social Drivers of Health   Financial Resource Strain: Medium Risk (10/13/2021)   Overall Financial Resource Strain (CARDIA)    Difficulty of Paying Living Expenses: Somewhat hard  Food Insecurity: No Food Insecurity (12/17/2023)   Hunger Vital Sign    Worried About Running Out of Food in the Last Year: Never true    Ran Out of Food in the Last Year: Never true  Transportation Needs: No Transportation Needs (12/17/2023)   PRAPARE - Administrator, Civil Service (Medical): No    Lack of Transportation (Non-Medical): No  Physical Activity: Insufficiently Active (10/13/2021)   Exercise Vital Sign    Days of Exercise per Week: 2 days    Minutes of Exercise per Session: 20 min  Stress: No Stress Concern Present (10/13/2021)   [employer] of Occupational Health - Occupational Stress Questionnaire    Feeling of Stress : Not at all  Social Connections: Moderately Integrated (12/17/2023)   Social Connection and Isolation Panel    Frequency of Communication with Friends and Family: More than three times a week    Frequency of Social Gatherings with Friends and Family: More than three times a week    Attends Religious Services: 1 to 4 times per year    Active Member of [employer] or Organizations: No    Attends Banker Meetings: 1 to 4 times per year    Marital Status: Widowed  Intimate Partner Violence: Not At Risk (12/17/2023)   Humiliation, Afraid, Rape, and Kick questionnaire    Fear of Current or Ex-Partner: No    Emotionally Abused: No    Physically Abused: No    Sexually Abused: No    Outpatient Medications Prior to Visit  Medication Sig Dispense Refill   acetaminophen  (TYLENOL ) 500 MG tablet Take 500 mg by mouth every 6 (six) hours as needed for moderate pain (pain score 4-6) or headache.     amiodarone  (PACERONE ) 200 MG tablet TAKE 1 TABLET BY MOUTH EVERY DAY 90 tablet 3   apixaban   (ELIQUIS ) 5 MG TABS tablet TAKE 1 TABLET BY MOUTH TWICE A DAY 180 tablet 1   BD PEN NEEDLE NANO 2ND GEN 32G X 4 MM MISC See admin instructions.     cefadroxil  (DURICEF) 500 MG capsule Take 1 capsule (500 mg total) by mouth 2 (two) times daily.     dapagliflozin  propanediol (FARXIGA ) 10 MG TABS tablet TAKE 1 TABLET BY MOUTH EVERY DAY BEFORE BREAKFAST 90 tablet 3   ENTRESTO  24-26 MG Take 1 tablet by mouth 2 (two) times daily.     fenofibrate  (TRICOR ) 145 MG tablet TAKE 1 TABLET BY MOUTH EVERY DAY 90 tablet 3   furosemide  (LASIX ) 40 MG tablet Take 40 mg by mouth 2 (two) times daily. Take 2 tablets by mouth twice daily     gabapentin  (NEURONTIN ) 300 MG capsule Take 300 mg by mouth 2 (two) times daily as needed (pain).     HUMALOG  MIX 75/25 KWIKPEN (75-25) 100 UNIT/ML KwikPen Take 10 units with morning meal and 10 units with evening meal     levothyroxine  (SYNTHROID ) 50 MCG tablet Take 1 tablet (50 mcg total) by mouth daily. 90 tablet 3   LIVALO  4 MG TABS TAKE 1 TABLET BY MOUTH EVERY DAY 90 tablet 1   metoprolol  succinate (TOPROL -XL) 25 MG 24 hr tablet TAKE 1 TABLET BY MOUTH EVERYDAY AT BEDTIME 90 tablet 3   mexiletine (MEXITIL ) 150 MG capsule TAKE 2 CAPSULES (300 MG TOTAL) BY MOUTH 2 (TWO) TIMES DAILY. 360 capsule 3   Multiple Vitamin (MULTIVITAMIN WITH MINERALS) TABS tablet Take 1 tablet by mouth daily.     ondansetron  (ZOFRAN ) 4 MG tablet TAKE 1 TABLET BY MOUTH EVERY 8 HOURS AS NEEDED FOR NAUSEA AND VOMITING 20 tablet 0   ONETOUCH VERIO test strip 1 each 3 (three) times daily.     potassium chloride  (MICRO-K ) 10 MEQ CR capsule Take 1 capsule (10 mEq total) by mouth 2 (two) times daily. 60 capsule 3   VASCEPA  1 g capsule TAKE 2 CAPSULES BY MOUTH TWICE A  DAY 120 capsule 5   famotidine  (PEPCID ) 20 MG tablet Take 1 tablet (20 mg total) by mouth daily. 90 tablet 0   sertraline  (ZOLOFT ) 25 MG tablet TAKE 1 TABLET (25 MG TOTAL) BY MOUTH DAILY. 90 tablet 2   MOUNJARO 2.5 MG/0.5ML Pen Inject 2.5 mg into the  skin once a week. Thursdays (Patient not taking: Reported on 03/10/2024)     No facility-administered medications prior to visit.    Allergies  Allergen Reactions   Neomycin-Bacitracin Zn-Polymyx Rash   Sulfa Antibiotics Diarrhea and Nausea And Vomiting   Ciprofloxacin  Hives, Itching and Rash    Pt doesn't remember having any reaction to cipro    Spironolactone  Rash    Review of Systems  Constitutional:  Negative for fever and malaise/fatigue.  HENT:  Negative for congestion.   Eyes:  Negative for blurred vision.  Respiratory:  Negative for cough and shortness of breath.   Cardiovascular:  Negative for chest pain, palpitations and leg swelling.  Gastrointestinal:  Positive for abdominal pain and nausea. Negative for blood in stool, constipation, diarrhea, heartburn and vomiting.  Musculoskeletal:  Negative for back pain.  Skin:  Negative for rash.  Neurological:  Negative for loss of consciousness and headaches.  Psychiatric/Behavioral:  Positive for depression. Negative for hallucinations, substance abuse and suicidal ideas. The patient is nervous/anxious and has insomnia.        Objective:    Physical Exam Vitals and nursing note reviewed.  Constitutional:      General: She is not in acute distress.    Appearance: Normal appearance. She is well-developed.  HENT:     Head: Normocephalic and atraumatic.  Eyes:     General: No scleral icterus.       Right eye: No discharge.        Left eye: No discharge.  Cardiovascular:     Rate and Rhythm: Normal rate and regular rhythm.     Heart sounds: No murmur heard. Pulmonary:     Effort: Pulmonary effort is normal. No respiratory distress.     Breath sounds: Normal breath sounds.  Musculoskeletal:        General: Normal range of motion.     Cervical back: Normal range of motion and neck supple.     Right lower leg: No edema.     Left lower leg: No edema.  Skin:    General: Skin is warm and dry.     Findings: Erythema and  lesion present. No rash.      Neurological:     Mental Status: She is alert and oriented to person, place, and time.  Psychiatric:        Mood and Affect: Mood is anxious and depressed.        Behavior: Behavior normal.        Thought Content: Thought content normal.        Judgment: Judgment normal.     BP 128/88 (BP Location: Right Arm, Patient Position: Sitting, Cuff Size: Large)   Pulse 74   Temp 97.9 F (36.6 C) (Oral)   Resp 18   Ht 5' 6 (1.676 m)   Wt 248 lb (112.5 kg)   SpO2 95%   BMI 40.03 kg/m  Wt Readings from Last 3 Encounters:  03/10/24 248 lb (112.5 kg)  02/29/24 250 lb (113.4 kg)  12/17/23 254 lb 3.1 oz (115.3 kg)    Diabetic Foot Exam - Simple   No data filed    Lab Results  Component Value  Date   WBC 7.8 02/29/2024   HGB 15.9 (H) 02/29/2024   HCT 50.5 (H) 02/29/2024   PLT 167 02/29/2024   GLUCOSE 142 (H) 02/29/2024   CHOL 152 09/14/2023   TRIG 307 (H) 09/14/2023   HDL 32 (L) 09/14/2023   LDLDIRECT 42.0 11/30/2014   LDLCALC 83 09/14/2023   ALT 21 02/29/2024   AST 58 (H) 02/29/2024   NA 138 02/29/2024   K 2.9 (L) 02/29/2024   CL 100 02/29/2024   CREATININE 0.82 02/29/2024   BUN 14 02/29/2024   CO2 26 02/29/2024   TSH 20.300 (H) 11/26/2023   INR 1.6 (H) 12/17/2023   HGBA1C 6.2 01/30/2024   MICROALBUR 12.8 10/26/2020    Lab Results  Component Value Date   TSH 20.300 (H) 11/26/2023   Lab Results  Component Value Date   WBC 7.8 02/29/2024   HGB 15.9 (H) 02/29/2024   HCT 50.5 (H) 02/29/2024   MCV 94.0 02/29/2024   PLT 167 02/29/2024   Lab Results  Component Value Date   NA 138 02/29/2024   K 2.9 (L) 02/29/2024   CO2 26 02/29/2024   GLUCOSE 142 (H) 02/29/2024   BUN 14 02/29/2024   CREATININE 0.82 02/29/2024   BILITOT 0.9 02/29/2024   ALKPHOS 45 02/29/2024   AST 58 (H) 02/29/2024   ALT 21 02/29/2024   PROT 6.9 02/29/2024   ALBUMIN 3.1 (L) 02/29/2024   CALCIUM  8.0 (L) 02/29/2024   ANIONGAP 12 02/29/2024   EGFR 32 (L)  02/03/2022   GFR 37.04 (L) 01/30/2024   Lab Results  Component Value Date   CHOL 152 09/14/2023   Lab Results  Component Value Date   HDL 32 (L) 09/14/2023   Lab Results  Component Value Date   LDLCALC 83 09/14/2023   Lab Results  Component Value Date   TRIG 307 (H) 09/14/2023   Lab Results  Component Value Date   CHOLHDL 4.8 09/14/2023   Lab Results  Component Value Date   HGBA1C 6.2 01/30/2024       Assessment & Plan:  Nausea -     Ondansetron  HCl; Take 1 tablet (8 mg total) by mouth every 8 (eight) hours as needed for nausea or vomiting.  Dispense: 30 tablet; Refill: 0 -     Famotidine ; Take 1 tablet (20 mg total) by mouth daily.  Dispense: 90 tablet; Refill: 0  Depression with anxiety -     Sertraline  HCl; Take 1 tablet (25 mg total) by mouth daily.  Dispense: 90 tablet; Refill: 2 -     Sertraline  HCl; Take 1 tablet (25 mg total) by mouth daily.  Dispense: 90 tablet; Refill: 3  Cellulitis of left lower extremity -     Cephalexin ; Take 1 capsule (500 mg total) by mouth 2 (two) times daily.  Dispense: 20 capsule; Refill: 0  Insomnia, unspecified type -     LORazepam ; Take 1 tablet (0.5 mg total) by mouth at bedtime.  Dispense: 30 tablet; Refill: 1  Assessment and Plan Assessment & Plan Nausea   Intermittent nausea associated with eating, previously persistent for 2-3 weeks, resolved after a conversation with a nurse. No abdominal pain is reported. An ultrasound identified one gallstone. Ondansetron  and famotidine  were previously prescribed, providing partial relief with two doses of ondansetron . Increase the dose of ondansetron .  Anxiety and Depression   Anxiety and depression are exacerbated by significant life changes, including the anniversary of her father's passing and selling the family home. She is on a  low dose of sertraline  25 mg. Continue this dose and consider increasing it in 3-4 weeks if no improvement is observed.  Insomnia   Insomnia is related to  anxiety and recent life changes. She previously used Xanax from her son, which is illegal and not recommended due to addiction potential. Prescribe a less addictive sleep aid and advise using it sparingly to avoid dependency.  Dry skin with blistering on leg   Dry skin with blistering on her leg may require antibiotic treatment. No swelling is reported. Diabetes is considered as a complicating factor. Prescribe Keflex  for the leg blistering.  Type 2 diabetes mellitus   Type 2 diabetes mellitus is well-managed with stable blood sugar levels. A1c was checked in July. She continues ongoing management with her endocrinologist.  Chronic heart disease (not a surgical candidate)   Chronic heart disease significantly limits her mobility. She is not a surgical candidate for stent placement due to high risk of poor outcomes. Her cardiologist advised against surgery due to the potential for becoming a nursing home patient if she survived.    Cross Jorge R Lowne Chase, DO

## 2024-03-10 NOTE — Patient Instructions (Signed)
 Insomnia Insomnia is a sleep disorder that makes it difficult to fall asleep or stay asleep. Insomnia can cause fatigue, low energy, difficulty concentrating, mood swings, and poor performance at work or school. There are three different ways to classify insomnia: Difficulty falling asleep. Difficulty staying asleep. Waking up too early in the morning. Any type of insomnia can be long-term (chronic) or short-term (acute). Both are common. Short-term insomnia usually lasts for 3 months or less. Chronic insomnia occurs at least three times a week for longer than 3 months. What are the causes? Insomnia may be caused by another condition, situation, or substance, such as: Having certain mental health conditions, such as anxiety and depression. Using caffeine, alcohol , tobacco, or drugs. Having gastrointestinal conditions, such as gastroesophageal reflux disease (GERD). Having certain medical conditions. These include: Asthma. Alzheimer's disease. Stroke. Chronic pain. An overactive thyroid  gland (hyperthyroidism). Other sleep disorders, such as restless legs syndrome and sleep apnea. Menopause. Sometimes, the cause of insomnia may not be known. What increases the risk? Risk factors for insomnia include: Gender. Females are affected more often than males. Age. Insomnia is more common as people get older. Stress and certain medical and mental health conditions. Lack of exercise. Having an irregular work schedule. This may include working night shifts and traveling between different time zones. What are the signs or symptoms? If you have insomnia, the main symptom is having trouble falling asleep or having trouble staying asleep. This may lead to other symptoms, such as: Feeling tired or having low energy. Feeling nervous about going to sleep. Not feeling rested in the morning. Having trouble concentrating. Feeling irritable, anxious, or depressed. How is this diagnosed? This condition  may be diagnosed based on: Your symptoms and medical history. Your health care provider may ask about: Your sleep habits. Any medical conditions you have. Your mental health. A physical exam. How is this treated? Treatment for insomnia depends on the cause. Treatment may focus on treating an underlying condition that is causing the insomnia. Treatment may also include: Medicines to help you sleep. Counseling or therapy. Lifestyle adjustments to help you sleep better. Follow these instructions at home: Eating and drinking  Limit or avoid alcohol , caffeinated beverages, and products that contain nicotine and tobacco, especially close to bedtime. These can disrupt your sleep. Do not eat a large meal or eat spicy foods right before bedtime. This can lead to digestive discomfort that can make it hard for you to sleep. Sleep habits  Keep a sleep diary to help you and your health care provider figure out what could be causing your insomnia. Write down: When you sleep. When you wake up during the night. How well you sleep and how rested you feel the next day. Any side effects of medicines you are taking. What you eat and drink. Make your bedroom a dark, comfortable place where it is easy to fall asleep. Put up shades or blackout curtains to block light from outside. Use a white noise machine to block noise. Keep the temperature cool. Limit screen use before bedtime. This includes: Not watching TV. Not using your smartphone, tablet, or computer. Stick to a routine that includes going to bed and waking up at the same times every day and night. This can help you fall asleep faster. Consider making a quiet activity, such as reading, part of your nighttime routine. Try to avoid taking naps during the day so that you sleep better at night. Get out of bed if you are still awake after  15 minutes of trying to sleep. Keep the lights down, but try reading or doing a quiet activity. When you feel  sleepy, go back to bed. General instructions Take over-the-counter and prescription medicines only as told by your health care provider. Exercise regularly as told by your health care provider. However, avoid exercising in the hours right before bedtime. Use relaxation techniques to manage stress. Ask your health care provider to suggest some techniques that may work well for you. These may include: Breathing exercises. Routines to release muscle tension. Visualizing peaceful scenes. Make sure that you drive carefully. Do not drive if you feel very sleepy. Keep all follow-up visits. This is important. Contact a health care provider if: You are tired throughout the day. You have trouble in your daily routine due to sleepiness. You continue to have sleep problems, or your sleep problems get worse. Get help right away if: You have thoughts about hurting yourself or someone else. Get help right away if you feel like you may hurt yourself or others, or have thoughts about taking your own life. Go to your nearest emergency room or: Call 911. Call the National Suicide Prevention Lifeline at 2232757840 or 988. This is open 24 hours a day. Text the Crisis Text Line at 657-529-4371. Summary Insomnia is a sleep disorder that makes it difficult to fall asleep or stay asleep. Insomnia can be long-term (chronic) or short-term (acute). Treatment for insomnia depends on the cause. Treatment may focus on treating an underlying condition that is causing the insomnia. Keep a sleep diary to help you and your health care provider figure out what could be causing your insomnia. This information is not intended to replace advice given to you by your health care provider. Make sure you discuss any questions you have with your health care provider. Document Revised: 06/13/2021 Document Reviewed: 06/13/2021 Elsevier Patient Education  2024 [employer].

## 2024-03-10 NOTE — Telephone Encounter (Signed)
 Appt today

## 2024-03-11 DIAGNOSIS — E039 Hypothyroidism, unspecified: Secondary | ICD-10-CM | POA: Diagnosis not present

## 2024-03-11 DIAGNOSIS — Z7984 Long term (current) use of oral hypoglycemic drugs: Secondary | ICD-10-CM | POA: Diagnosis not present

## 2024-03-11 DIAGNOSIS — M7021 Olecranon bursitis, right elbow: Secondary | ICD-10-CM | POA: Diagnosis not present

## 2024-03-11 DIAGNOSIS — I130 Hypertensive heart and chronic kidney disease with heart failure and stage 1 through stage 4 chronic kidney disease, or unspecified chronic kidney disease: Secondary | ICD-10-CM | POA: Diagnosis not present

## 2024-03-11 DIAGNOSIS — N183 Chronic kidney disease, stage 3 unspecified: Secondary | ICD-10-CM | POA: Diagnosis not present

## 2024-03-11 DIAGNOSIS — I480 Paroxysmal atrial fibrillation: Secondary | ICD-10-CM | POA: Diagnosis not present

## 2024-03-13 NOTE — Progress Notes (Signed)
 Remote ICD transmission.

## 2024-03-18 DIAGNOSIS — I4892 Unspecified atrial flutter: Secondary | ICD-10-CM | POA: Diagnosis not present

## 2024-03-18 DIAGNOSIS — I130 Hypertensive heart and chronic kidney disease with heart failure and stage 1 through stage 4 chronic kidney disease, or unspecified chronic kidney disease: Secondary | ICD-10-CM | POA: Diagnosis not present

## 2024-03-18 DIAGNOSIS — I5042 Chronic combined systolic (congestive) and diastolic (congestive) heart failure: Secondary | ICD-10-CM | POA: Diagnosis not present

## 2024-03-18 DIAGNOSIS — E785 Hyperlipidemia, unspecified: Secondary | ICD-10-CM | POA: Diagnosis not present

## 2024-03-18 DIAGNOSIS — G4733 Obstructive sleep apnea (adult) (pediatric): Secondary | ICD-10-CM | POA: Diagnosis not present

## 2024-03-18 DIAGNOSIS — J449 Chronic obstructive pulmonary disease, unspecified: Secondary | ICD-10-CM | POA: Diagnosis not present

## 2024-03-18 DIAGNOSIS — Z7984 Long term (current) use of oral hypoglycemic drugs: Secondary | ICD-10-CM | POA: Diagnosis not present

## 2024-03-18 DIAGNOSIS — E039 Hypothyroidism, unspecified: Secondary | ICD-10-CM | POA: Diagnosis not present

## 2024-03-18 DIAGNOSIS — M19031 Primary osteoarthritis, right wrist: Secondary | ICD-10-CM | POA: Diagnosis not present

## 2024-03-18 DIAGNOSIS — N183 Chronic kidney disease, stage 3 unspecified: Secondary | ICD-10-CM | POA: Diagnosis not present

## 2024-03-18 DIAGNOSIS — E1122 Type 2 diabetes mellitus with diabetic chronic kidney disease: Secondary | ICD-10-CM | POA: Diagnosis not present

## 2024-03-18 DIAGNOSIS — E1151 Type 2 diabetes mellitus with diabetic peripheral angiopathy without gangrene: Secondary | ICD-10-CM | POA: Diagnosis not present

## 2024-03-18 DIAGNOSIS — M25461 Effusion, right knee: Secondary | ICD-10-CM | POA: Diagnosis not present

## 2024-03-18 DIAGNOSIS — Z7901 Long term (current) use of anticoagulants: Secondary | ICD-10-CM | POA: Diagnosis not present

## 2024-03-18 DIAGNOSIS — I472 Ventricular tachycardia, unspecified: Secondary | ICD-10-CM | POA: Diagnosis not present

## 2024-03-18 DIAGNOSIS — M7021 Olecranon bursitis, right elbow: Secondary | ICD-10-CM | POA: Diagnosis not present

## 2024-03-18 DIAGNOSIS — M19041 Primary osteoarthritis, right hand: Secondary | ICD-10-CM | POA: Diagnosis not present

## 2024-03-18 DIAGNOSIS — G8929 Other chronic pain: Secondary | ICD-10-CM | POA: Diagnosis not present

## 2024-03-18 DIAGNOSIS — M19032 Primary osteoarthritis, left wrist: Secondary | ICD-10-CM | POA: Diagnosis not present

## 2024-03-18 DIAGNOSIS — I480 Paroxysmal atrial fibrillation: Secondary | ICD-10-CM | POA: Diagnosis not present

## 2024-03-18 DIAGNOSIS — M19042 Primary osteoarthritis, left hand: Secondary | ICD-10-CM | POA: Diagnosis not present

## 2024-03-18 DIAGNOSIS — M5126 Other intervertebral disc displacement, lumbar region: Secondary | ICD-10-CM | POA: Diagnosis not present

## 2024-03-20 ENCOUNTER — Other Ambulatory Visit (HOSPITAL_COMMUNITY): Payer: Self-pay | Admitting: Cardiology

## 2024-03-20 ENCOUNTER — Telehealth: Payer: Self-pay

## 2024-03-20 ENCOUNTER — Other Ambulatory Visit: Payer: Self-pay | Admitting: Family Medicine

## 2024-03-20 DIAGNOSIS — L03116 Cellulitis of left lower limb: Secondary | ICD-10-CM

## 2024-03-20 MED ORDER — CEPHALEXIN 500 MG PO CAPS: 500.0000 mg | ORAL_CAPSULE | Freq: Two times a day (BID) | ORAL | 0 refills | Status: DC

## 2024-03-20 MED ORDER — EPLERENONE 25 MG PO TABS: 25.0000 mg | ORAL_TABLET | Freq: Every day | ORAL | 11 refills | Status: DC

## 2024-03-20 NOTE — Telephone Encounter (Signed)
 Copied from CRM [id number]. Topic: Clinical - Medication Question >> Mar 20, 2024  1:21 PM Paige D wrote: Reason for CRM: Pt is calling in regards to her foot infection she said it is getting better but she feels she needs another round of  cephALEXin  (KEFLEX ) 500 MG capsule please reach out to pt in regards to this or if she needs to come back in.

## 2024-03-21 ENCOUNTER — Ambulatory Visit: Payer: Medicare Other

## 2024-03-24 ENCOUNTER — Ambulatory Visit: Attending: Student | Admitting: Student

## 2024-03-24 ENCOUNTER — Ambulatory Visit: Payer: Self-pay | Admitting: Cardiology

## 2024-03-24 ENCOUNTER — Encounter: Payer: Self-pay | Admitting: Student

## 2024-03-24 VITALS — BP 112/68 | HR 75 | Ht 66.0 in | Wt 248.0 lb

## 2024-03-24 DIAGNOSIS — I5022 Chronic systolic (congestive) heart failure: Secondary | ICD-10-CM | POA: Diagnosis not present

## 2024-03-24 DIAGNOSIS — I4819 Other persistent atrial fibrillation: Secondary | ICD-10-CM

## 2024-03-24 DIAGNOSIS — D6869 Other thrombophilia: Secondary | ICD-10-CM | POA: Diagnosis not present

## 2024-03-24 DIAGNOSIS — I472 Ventricular tachycardia, unspecified: Secondary | ICD-10-CM

## 2024-03-24 LAB — CUP PACEART INCLINIC DEVICE CHECK
Battery Remaining Longevity: 104 mo
Battery Voltage: 3.01 V
Brady Statistic RV Percent Paced: 0.11 %
Date Time Interrogation Session: 20250908130634
HighPow Impedance: 77 Ohm
Implantable Lead Connection Status: 753985
Implantable Lead Implant Date: 20220302
Implantable Lead Location: 753860
Implantable Pulse Generator Implant Date: 20220302
Lead Channel Impedance Value: 2565 Ohm
Lead Channel Impedance Value: 2660 Ohm
Lead Channel Pacing Threshold Amplitude: 1.625 V
Lead Channel Pacing Threshold Pulse Width: 0.4 ms
Lead Channel Sensing Intrinsic Amplitude: 8.375 mV
Lead Channel Sensing Intrinsic Amplitude: 8.875 mV
Lead Channel Setting Pacing Amplitude: 3 V
Lead Channel Setting Pacing Pulse Width: 1 ms
Lead Channel Setting Sensing Sensitivity: 0.3 mV
Zone Setting Status: 755011
Zone Setting Status: 755011

## 2024-03-24 NOTE — Patient Instructions (Signed)
 Medication Instructions:  Your physician recommends that you continue on your current medications as directed. Please refer to the Current Medication list given to you today.  *If you need a refill on your cardiac medications before your next appointment, please call your pharmacy*  Lab Work: None ordered If you have labs (blood work) drawn today and your tests are completely normal, you will receive your results only by: MyChart Message (if you have MyChart) OR A paper copy in the mail If you have any lab test that is abnormal or we need to change your treatment, we will call you to review the results.  Follow-Up: At [facility], you and your health needs are our priority.  As part of our continuing mission to provide you with exceptional heart care, our providers are all part of one team.  This team includes your primary Cardiologist (physician) and Advanced Practice Providers or APPs (Physician Assistants and Nurse Practitioners) who all work together to provide you with the care you need, when you need it.  Your next appointment:   03/28/24 at 3:45 PM  Provider:   Fonda [name], MD

## 2024-03-24 NOTE — Progress Notes (Signed)
  Electrophysiology Office Note:   ID:  [name], [name] 07/19/1940, MRN [id number]  Primary Cardiologist: Ezra Shuck, MD Electrophysiologist: Soyla Gladis Norton, MD      History of Present Illness:   Heidi Stark is a 83 y.o. female with h/o chronic systolic CHF, PAF, and VT seen today for routine electrophysiology followup.   Since last being seen in our clinic the patient reports doing OK. She is currently being treated for a foot infection and has at least a week of antibiotics left. Her device has been alarming each morning.  Otherwise, she denies chest pain, edema, or palpitations. No syncope or ICD shocks.   Review of systems complete and found to be negative unless listed in HPI.   EP Information / Studies Reviewed:    EKG is not ordered today. EKG from 03/05/2024 reviewed which showed likely AF with V pacing       ICD Interrogation-  reviewed in detail today,  See PACEART report.  Arrhythmia/Device History MDT ICD, mp 09/2020; dx: NICM, VT + appropriate therapies   AAD History: Tikosyn , off d/t VT (though not thought to be tikosyn  induced VT) Mexiletine 150mg  BID > inc to 300mg  BID ~07/10/22 Amiodarone     Physical Exam:   VS:  BP 112/68   Pulse 75   Ht 5' 6 (1.676 m)   Wt 248 lb (112.5 kg)   SpO2 94%   BMI 40.03 kg/m    Wt Readings from Last 3 Encounters:  03/24/24 248 lb (112.5 kg)  03/10/24 248 lb (112.5 kg)  02/29/24 250 lb (113.4 kg)     GEN: No acute distress  NECK: No JVD; No carotid bruits CARDIAC: Irregularly irregular rate and rhythm, no murmurs, rubs, gallops RESPIRATORY:  Clear to auscultation without rales, wheezing or rhonchi  ABDOMEN: Soft, non-tender, non-distended EXTREMITIES:  No edema; No deformity   ASSESSMENT AND PLAN:    Chronic systolic CHF  s/p Medtronic single chamber ICD  euvolemic today Stable on an appropriate medical regimen  RV lead impedance has gradually climbed and noise is present. CXR with angle in the lead  near the clavicle, so there is some concern for lead fracture.   As she has had appropriate therapies in the past, will need to discuss extraction vs lead abandonment.   Will discuss with Dr. Norton and set up with an extractor to discuss.   Foot infection Cellulitis Finished antibiotics some time next week.   VT Hypothyroid Continue amiodarone  200 mg daily With h/o appropriate therapies, it is felt she should remain on amiodarone  and thyroid  be managed.   Persistent AF Atrial flutter Continue eliquis  5 mg BID EKG 8/15 and 12/16/2023 both consistent with being out of rhythm Will need to hold for procedure.   Disposition:   Follow up with Dr. Kennyth or Dr. Cindie to discuss extraction vs lead abandonment.    Signed, Ozell Prentice Passey, PA-C

## 2024-03-25 DIAGNOSIS — I130 Hypertensive heart and chronic kidney disease with heart failure and stage 1 through stage 4 chronic kidney disease, or unspecified chronic kidney disease: Secondary | ICD-10-CM | POA: Diagnosis not present

## 2024-03-25 DIAGNOSIS — G8929 Other chronic pain: Secondary | ICD-10-CM | POA: Diagnosis not present

## 2024-03-25 DIAGNOSIS — N183 Chronic kidney disease, stage 3 unspecified: Secondary | ICD-10-CM | POA: Diagnosis not present

## 2024-03-25 DIAGNOSIS — M19032 Primary osteoarthritis, left wrist: Secondary | ICD-10-CM | POA: Diagnosis not present

## 2024-03-25 DIAGNOSIS — I480 Paroxysmal atrial fibrillation: Secondary | ICD-10-CM | POA: Diagnosis not present

## 2024-03-25 DIAGNOSIS — E039 Hypothyroidism, unspecified: Secondary | ICD-10-CM | POA: Diagnosis not present

## 2024-03-25 DIAGNOSIS — E785 Hyperlipidemia, unspecified: Secondary | ICD-10-CM | POA: Diagnosis not present

## 2024-03-25 DIAGNOSIS — I472 Ventricular tachycardia, unspecified: Secondary | ICD-10-CM | POA: Diagnosis not present

## 2024-03-25 DIAGNOSIS — E1151 Type 2 diabetes mellitus with diabetic peripheral angiopathy without gangrene: Secondary | ICD-10-CM | POA: Diagnosis not present

## 2024-03-25 DIAGNOSIS — M19042 Primary osteoarthritis, left hand: Secondary | ICD-10-CM | POA: Diagnosis not present

## 2024-03-25 DIAGNOSIS — M19041 Primary osteoarthritis, right hand: Secondary | ICD-10-CM | POA: Diagnosis not present

## 2024-03-25 DIAGNOSIS — G4733 Obstructive sleep apnea (adult) (pediatric): Secondary | ICD-10-CM | POA: Diagnosis not present

## 2024-03-25 DIAGNOSIS — M5126 Other intervertebral disc displacement, lumbar region: Secondary | ICD-10-CM | POA: Diagnosis not present

## 2024-03-25 DIAGNOSIS — M25461 Effusion, right knee: Secondary | ICD-10-CM | POA: Diagnosis not present

## 2024-03-25 DIAGNOSIS — M19031 Primary osteoarthritis, right wrist: Secondary | ICD-10-CM | POA: Diagnosis not present

## 2024-03-25 DIAGNOSIS — Z7984 Long term (current) use of oral hypoglycemic drugs: Secondary | ICD-10-CM | POA: Diagnosis not present

## 2024-03-25 DIAGNOSIS — Z7901 Long term (current) use of anticoagulants: Secondary | ICD-10-CM | POA: Diagnosis not present

## 2024-03-25 DIAGNOSIS — E1122 Type 2 diabetes mellitus with diabetic chronic kidney disease: Secondary | ICD-10-CM | POA: Diagnosis not present

## 2024-03-25 DIAGNOSIS — I5042 Chronic combined systolic (congestive) and diastolic (congestive) heart failure: Secondary | ICD-10-CM | POA: Diagnosis not present

## 2024-03-25 DIAGNOSIS — M7021 Olecranon bursitis, right elbow: Secondary | ICD-10-CM | POA: Diagnosis not present

## 2024-03-25 DIAGNOSIS — J449 Chronic obstructive pulmonary disease, unspecified: Secondary | ICD-10-CM | POA: Diagnosis not present

## 2024-03-25 DIAGNOSIS — I4892 Unspecified atrial flutter: Secondary | ICD-10-CM | POA: Diagnosis not present

## 2024-03-28 ENCOUNTER — Encounter: Payer: Self-pay | Admitting: Cardiology

## 2024-03-28 ENCOUNTER — Ambulatory Visit: Attending: Cardiology | Admitting: Cardiology

## 2024-03-28 VITALS — BP 114/60 | HR 70 | Ht 66.0 in | Wt 250.0 lb

## 2024-03-28 DIAGNOSIS — T82110D Breakdown (mechanical) of cardiac electrode, subsequent encounter: Secondary | ICD-10-CM

## 2024-03-28 DIAGNOSIS — I480 Paroxysmal atrial fibrillation: Secondary | ICD-10-CM | POA: Diagnosis not present

## 2024-03-28 DIAGNOSIS — I5022 Chronic systolic (congestive) heart failure: Secondary | ICD-10-CM

## 2024-03-28 DIAGNOSIS — I4892 Unspecified atrial flutter: Secondary | ICD-10-CM

## 2024-03-28 DIAGNOSIS — Z79899 Other long term (current) drug therapy: Secondary | ICD-10-CM

## 2024-03-28 DIAGNOSIS — Z95810 Presence of automatic (implantable) cardiac defibrillator: Secondary | ICD-10-CM | POA: Diagnosis not present

## 2024-03-28 DIAGNOSIS — I4819 Other persistent atrial fibrillation: Secondary | ICD-10-CM

## 2024-03-28 DIAGNOSIS — D6869 Other thrombophilia: Secondary | ICD-10-CM

## 2024-03-28 DIAGNOSIS — T82110A Breakdown (mechanical) of cardiac electrode, initial encounter: Secondary | ICD-10-CM

## 2024-03-28 DIAGNOSIS — I472 Ventricular tachycardia, unspecified: Secondary | ICD-10-CM

## 2024-03-28 NOTE — Patient Instructions (Addendum)
 Medication Instructions:  Your physician recommends that you continue on your current medications as directed. Please refer to the Current Medication list given to you today.  *If you need a refill on your cardiac medications before your next appointment, please call your pharmacy*  Lab Work: None ordered.  If you have labs (blood work) drawn today and your tests are completely normal, you will receive your results only by: MyChart Message (if you have MyChart) OR A paper copy in the mail If you have any lab test that is abnormal or we need to change your treatment, we will call you to review the results.  Testing/Procedures: None ordered.   Follow-Up: At [facility], you and your health needs are our priority.  As part of our continuing mission to provide you with exceptional heart care, our providers are all part of one team.  This team includes your primary Cardiologist (physician) and Advanced Practice Providers or APPs (Physician Assistants and Nurse Practitioners) who all work together to provide you with the care you need, when you need it.  Your next appointment:   Referred back to Dr Inocencio for further discussion of lead abandonment.  Thursday, 04/24/2024 at 1130am with Dr Inocencio

## 2024-03-28 NOTE — Progress Notes (Signed)
 Electrophysiology Office Note:   Date:  03/29/2024  ID:  MICHAELLE BOTTOMLEY, DOB [birth year], MRN [id number]  Primary Cardiologist: Ezra Shuck, MD Electrophysiologist: Soyla Gladis Norton, MD      History of Present Illness:   KAYLEEN ALIG is a 83 y.o. female with h/o chronic systolic heart failure with recovered LVEF, VT status post ICD, hypertension, diabetes, atrial fibrillation who is being seen today for evaluation for ICD lead extraction.  Discussed the use of AI scribe software for clinical note transcription with the patient, who gave verbal consent to proceed.  History of Present Illness OSMARA DRUMMONDS is an 83 year old female with congestive heart failure and diabetes who presents with concerns about her defibrillator wire malfunction. She was referred by Jodie Passey, PA for evaluation of her defibrillator wire malfunction.  She has been experiencing daily alarms from her defibrillator at 8:45 AM, indicating abnormal parameters. Approximately a year ago, she received shocks from the device twice, but she was unaware of them as she lost consciousness before the shocks occurred.  The defibrillator wire has been in place for about three years.  She uses a wheelchair due to limited mobility from a ruptured disc. Her diabetes and congestive heart failure increase her risk of complications, including infection. She has not had recent visits with her heart failure doctor but is scheduled to see her in October.  No new or acute complaints today.    Review of systems complete and found to be negative unless listed in HPI.   EP Information / Studies Reviewed:    EKG is ordered today. Personal review as below.  EKG Interpretation Date/Time:  Friday March 28 2024 15:48:26 EDT Ventricular Rate:  70 PR Interval:    QRS Duration:  162 QT Interval:  452 QTC Calculation: 488 R Axis:   242  Text Interpretation: Atrial fibrillation Right bundle branch block Possible Lateral  infarct , age undetermined Cannot rule out Inferior infarct , age undetermined When compared with ECG of 29-Feb-2024 18:28,  Similar findings Confirmed by Kennyth Chew [phone]) on 03/29/2024 9:49:41 AM   Echo 12/18/23:   1. Left ventricular ejection fraction, by estimation, is 70 to 75%. The  left ventricle has hyperdynamic function. The left ventricle has no  regional wall motion abnormalities. There is moderate concentric left  ventricular hypertrophy. Left ventricular  diastolic function could not be evaluated.   2. Right ventricular systolic function was not well visualized. The right  ventricular size is not well visualized. There is mildly elevated  pulmonary artery systolic pressure. The estimated right ventricular  systolic pressure is 37.1 mmHg.   3. Left atrial size was severely dilated.   4. The mitral valve is degenerative. No evidence of mitral valve  regurgitation. No evidence of mitral stenosis. Moderate mitral annular  calcification.   5. The aortic valve is tricuspid. There is mild calcification of the  aortic valve. There is mild thickening of the aortic valve. Aortic valve  regurgitation is not visualized. Aortic valve sclerosis/calcification is  present, without any evidence of  aortic stenosis.   6. There is borderline dilatation of the ascending aorta, measuring 38  mm.   7. The inferior vena cava is normal in size with greater than 50%  respiratory variability, suggesting right atrial pressure of 3 mmHg.   Risk Assessment/Calculations:    CHA2DS2-VASc Score = 9   This indicates a 12.2% annual risk of stroke. 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  Well-healed left chest ICD pocket. RESPIRATORY:  Clear to auscultation without rales, wheezing or rhonchi  ABDOMEN: Soft, non-distended EXTREMITIES:  No edema; No deformity   ASSESSMENT AND PLAN:   Ms. Hegwood has a history of ventricular tachycardia requiring ATP and prior ICD shock.  She is on amiodarone  and mexiletine for suppression.  She has a single-lead ICD.  Unfortunately, RV pacing impedance is markedly elevated, concerning for lead failure.  #S/p VVI ICD #RV lead malfunction - In-clinic device interrogation was performed today.  Battery status okay.  High-voltage impedance within normal limits.  RV pacing impedance greater than 2000 ohms.  Capture threshold stable but elevated.  No episodes of RV lead noise have been observed. -The concern is that patient has fracture of the pace/sense component of her RV ICD lead given markedly elevated RV pacing impedance.  Her capture threshold is also elevated.  She has no pacing needs.  The concern, however, would be that if there is fracture of the pace/sense conductor that she will eventually have issues with RV sensing and lack of therapy and/or RV lead noise which could lead to inappropriate shocks.  Despite her age of 67, patient and her daughter want to continue with ICD therapies.  We discussed the options for replacement of her malfunctioning RV lead, including RV lead abandonment and implantation of new RV ICD lead versus extraction of her existing lead and reimplantation of a new RV ICD lead.  We discussed that she is unlikely to be a candidate for cardiac surgery backup given her age, NYHA class III heart failure, and  wheelchair-bound status.  Without surgical backup there is a higher chance of death associated with RV lead extraction.  Patient and her daughter favor abandonment of her existing lead and implantation of a new lead.  She is followed by Dr. Inocencio.  Will reach out to him regarding results of our discussion today.  #VT: #High risk medication use: Amiodarone . - Continue amiodarone  200 mg daily. -Continue mexiletine 300 mg twice daily. -Continue metoprolol  XL 25 mg at bedtime.  #Paroxysmal atrial fibrillation: #Hypercoagulable state due to atrial fibrillation: - Continue amiodarone  and metoprolol  as above. -Continue Eliquis  5 mg twice daily.  #Chronic systolic heart failure with recovered LVEF: NYHA class III symptoms - Close follow-up with HF clinic, Dr. Rolan. - Continue GDMT regimen of dapagliflozin , Entresto , eplerenone , metoprolol .   Follow up with Dr. Inocencio     Signed, Fonda [name], MD

## 2024-04-01 ENCOUNTER — Ambulatory Visit (INDEPENDENT_AMBULATORY_CARE_PROVIDER_SITE_OTHER)

## 2024-04-01 ENCOUNTER — Other Ambulatory Visit (HOSPITAL_COMMUNITY): Payer: Self-pay | Admitting: Family Medicine

## 2024-04-01 DIAGNOSIS — M19031 Primary osteoarthritis, right wrist: Secondary | ICD-10-CM | POA: Diagnosis not present

## 2024-04-01 DIAGNOSIS — G4733 Obstructive sleep apnea (adult) (pediatric): Secondary | ICD-10-CM | POA: Diagnosis not present

## 2024-04-01 DIAGNOSIS — M19041 Primary osteoarthritis, right hand: Secondary | ICD-10-CM | POA: Diagnosis not present

## 2024-04-01 DIAGNOSIS — M25461 Effusion, right knee: Secondary | ICD-10-CM | POA: Diagnosis not present

## 2024-04-01 DIAGNOSIS — J449 Chronic obstructive pulmonary disease, unspecified: Secondary | ICD-10-CM | POA: Diagnosis not present

## 2024-04-01 DIAGNOSIS — M19032 Primary osteoarthritis, left wrist: Secondary | ICD-10-CM | POA: Diagnosis not present

## 2024-04-01 DIAGNOSIS — I480 Paroxysmal atrial fibrillation: Secondary | ICD-10-CM

## 2024-04-01 DIAGNOSIS — I130 Hypertensive heart and chronic kidney disease with heart failure and stage 1 through stage 4 chronic kidney disease, or unspecified chronic kidney disease: Secondary | ICD-10-CM | POA: Diagnosis not present

## 2024-04-01 DIAGNOSIS — M7021 Olecranon bursitis, right elbow: Secondary | ICD-10-CM | POA: Diagnosis not present

## 2024-04-01 DIAGNOSIS — N183 Chronic kidney disease, stage 3 unspecified: Secondary | ICD-10-CM | POA: Diagnosis not present

## 2024-04-01 DIAGNOSIS — E1151 Type 2 diabetes mellitus with diabetic peripheral angiopathy without gangrene: Secondary | ICD-10-CM | POA: Diagnosis not present

## 2024-04-01 DIAGNOSIS — Z7901 Long term (current) use of anticoagulants: Secondary | ICD-10-CM | POA: Diagnosis not present

## 2024-04-01 DIAGNOSIS — I472 Ventricular tachycardia, unspecified: Secondary | ICD-10-CM | POA: Diagnosis not present

## 2024-04-01 DIAGNOSIS — E1122 Type 2 diabetes mellitus with diabetic chronic kidney disease: Secondary | ICD-10-CM | POA: Diagnosis not present

## 2024-04-01 DIAGNOSIS — M19042 Primary osteoarthritis, left hand: Secondary | ICD-10-CM | POA: Diagnosis not present

## 2024-04-01 DIAGNOSIS — M5126 Other intervertebral disc displacement, lumbar region: Secondary | ICD-10-CM | POA: Diagnosis not present

## 2024-04-01 DIAGNOSIS — I5042 Chronic combined systolic (congestive) and diastolic (congestive) heart failure: Secondary | ICD-10-CM | POA: Diagnosis not present

## 2024-04-01 DIAGNOSIS — E039 Hypothyroidism, unspecified: Secondary | ICD-10-CM | POA: Diagnosis not present

## 2024-04-01 DIAGNOSIS — I4892 Unspecified atrial flutter: Secondary | ICD-10-CM | POA: Diagnosis not present

## 2024-04-01 DIAGNOSIS — E785 Hyperlipidemia, unspecified: Secondary | ICD-10-CM | POA: Diagnosis not present

## 2024-04-01 DIAGNOSIS — Z7984 Long term (current) use of oral hypoglycemic drugs: Secondary | ICD-10-CM | POA: Diagnosis not present

## 2024-04-01 DIAGNOSIS — G8929 Other chronic pain: Secondary | ICD-10-CM | POA: Diagnosis not present

## 2024-04-01 LAB — CUP PACEART REMOTE DEVICE CHECK
Battery Remaining Longevity: 104 mo
Battery Voltage: 3 V
Brady Statistic RV Percent Paced: 0.21 %
Date Time Interrogation Session: 20250916033328
HighPow Impedance: 75 Ohm
Implantable Lead Connection Status: 753985
Implantable Lead Implant Date: 20220302
Implantable Lead Location: 753860
Implantable Pulse Generator Implant Date: 20220302
Lead Channel Impedance Value: 2622 Ohm
Lead Channel Impedance Value: 2717 Ohm
Lead Channel Pacing Threshold Amplitude: 1.875 V
Lead Channel Pacing Threshold Pulse Width: 0.4 ms
Lead Channel Sensing Intrinsic Amplitude: 7 mV
Lead Channel Sensing Intrinsic Amplitude: 7 mV
Lead Channel Setting Pacing Amplitude: 3 V
Lead Channel Setting Pacing Pulse Width: 1 ms
Lead Channel Setting Sensing Sensitivity: 0.3 mV
Zone Setting Status: 755011
Zone Setting Status: 755011

## 2024-04-05 ENCOUNTER — Ambulatory Visit: Payer: Self-pay | Admitting: Cardiology

## 2024-04-07 ENCOUNTER — Ambulatory Visit: Admitting: Family Medicine

## 2024-04-07 ENCOUNTER — Telehealth (INDEPENDENT_AMBULATORY_CARE_PROVIDER_SITE_OTHER): Admitting: Family Medicine

## 2024-04-07 DIAGNOSIS — F418 Other specified anxiety disorders: Secondary | ICD-10-CM | POA: Diagnosis not present

## 2024-04-07 NOTE — Progress Notes (Signed)
Remote ICD Transmission.

## 2024-04-07 NOTE — Progress Notes (Signed)
 MyChart Video Visit    Virtual Visit via Video Note   This patient is at least at moderate risk for complications without adequate follow up. This format is felt to be most appropriate for this patient at this time. Physical exam was limited by quality of the video and audio technology used for the visit. Heidi Stark was able to get the patient set up on a video visit.  Patient location: home Patient and provider in visit Provider location: Office  I discussed the limitations of evaluation and management by telemedicine and the availability of in person appointments. The patient expressed understanding and agreed to proceed.  Visit Date: 04/07/2024  Today's healthcare provider: Jamee JONELLE Antonio Cyndee, DO     Subjective:    Patient ID: Rock CHRISTELLA Clonts, female    DOB: [birth year], 83 y.o.   MRN: [id number]  Chief Complaint  Patient presents with   Depression   Follow-up    HPI Patient is in today for f/u anxiety and nausea.  Discussed the use of AI scribe software for clinical note transcription with the patient, who gave verbal consent to proceed.  History of Present Illness Heidi Stark is an 83 year old female who presents for a follow-up to assess the effectiveness of her current medications for nausea and anxiety.  Her symptoms of nausea and sleep disturbances have improved significantly since her last visit in August. She is currently taking Zofran  for nausea and famotidine , and she feels that these medications are working well.  She is also on sertraline  and lorazepam  for anxiety. She does not need to take lorazepam  every night. She feels that the current dose of sertraline  is adequate and does not require an increase.  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 She has an upcoming appointment with Dr. Buddie, who originally placed the pacemaker, to discuss further management.    Past Medical History:   Diagnosis Date   Arthritis    hands, wrists (11/07/2016)   CHF (congestive heart failure) (HCC)    Chronic lower back pain    Constipation    DVT (deep venous thrombosis) (HCC)    History of blood transfusion 1990   related to OR   History of kidney stones    Hyperlipidemia    Hypertension    Joint pain    Lower extremity edema    OSA on CPAP    Persistent atrial fibrillation (HCC)    Pneumonia    several times (11/07/2016)   Type II diabetes mellitus (HCC)     Past Surgical History:  Procedure Laterality Date   ATRIAL FIBRILLATION ABLATION N/A 11/07/2016   Procedure: Atrial Fibrillation Ablation;  Surgeon: Will Gladis Norton, MD;  Location: [facility] INVASIVE CV LAB;  Service: Cardiovascular;  Laterality: N/A;   BACK SURGERY     CARDIAC CATHETERIZATION     CARDIOVERSION N/A 08/11/2016   Procedure: CARDIOVERSION;  Surgeon: Ezra GORMAN Shuck, MD;  Location[facility] ENDOSCOPY;  Service: Cardiovascular;  Laterality: N/A;   CARDIOVERSION N/A 12/03/2017   Procedure: CARDIOVERSION;  Surgeon: Shuck Ezra GORMAN, MD;  Location: [facility] ENDOSCOPY;  Service: Cardiovascular;  Laterality: N/A;   CARDIOVERSION N/A 05/02/2018   Procedure: CARDIOVERSION;  Surgeon: Mona Vinie BROCKS, MD;  Location: [facility] ENDOSCOPY;  Service: Cardiovascular;  Laterality: N/A;   CARDIOVERSION N/A 06/23/2020   Procedure: CARDIOVERSION;  Surgeon: Hobart Powell BRAVO, MD;  Location: [facility] ENDOSCOPY;  Service: Cardiovascular;  Laterality: N/A;   CARDIOVERSION N/A 10/11/2020   Procedure: CARDIOVERSION;  Surgeon: Rolan Ezra RAMAN, MD;  Location: [facility] ENDOSCOPY;  Service: Cardiovascular;  Laterality: N/A;   CARPAL TUNNEL RELEASE     CATARACT EXTRACTION W/ INTRAOCULAR LENS  IMPLANT, BILATERAL Bilateral    COLON SURGERY  1990   vein graft and colon repair after nicked artery with back surgert   COLONOSCOPY     CORONARY ANGIOGRAPHY N/A 09/08/2020   Procedure: CORONARY ANGIOGRAPHY;  Surgeon: Rolan Ezra RAMAN, MD;  Location: [facility] INVASIVE CV  LAB;  Service: Cardiovascular;  Laterality: N/A;   CYSTECTOMY     between bladder and kidneys   GANGLION CYST EXCISION Left    ICD IMPLANT N/A 09/15/2020   Procedure: ICD IMPLANT;  Surgeon: Inocencio Soyla Lunger, MD;  Location: [facility] INVASIVE CV LAB;  Service: Cardiovascular;  Laterality: N/A;   KNEE CARTILAGE SURGERY Right 1960s   LUMBAR DISC SURGERY  1990   REPAIR ILIAC ARTERY  1990   vein graft and colon repair after nicked artery with back surgert   TEE WITHOUT CARDIOVERSION N/A 10/31/2013   Procedure: TRANSESOPHAGEAL ECHOCARDIOGRAM (TEE);  Surgeon: Ezra RAMAN Rolan, MD;  Location: [facility] ENDOSCOPY;  Service: Cardiovascular;  Laterality: N/A;   TUBAL LIGATION      Family History  Problem Relation Age of Onset   Diabetes Mother    Hypertension Mother    Cancer Mother    Obesity Mother    Diabetes Father    Obesity Father    Diabetes Other    Melanoma Other    Factor V Leiden deficiency Daughter     Social History   Socioeconomic History   Marital status: Married    Spouse name: [name]   Number of children: 2   Years of education: Not on file   Highest education level: Not on file  Occupational History   Occupation: Retired  Tobacco Use   Smoking status: Former    Current packs/day: 0.00    Average packs/day: 1 pack/day for 50.0 years (50.0 ttl pk-yrs)    Types: Cigarettes    Start date: 08/17/1970    Quit date: 08/17/2020    Years since quitting: 3.6   Smokeless tobacco: Never   Tobacco comments:    Quit 2022. [employer]  Vaping Use   Vaping status: Never Used  Substance and Sexual Activity   Alcohol use: No    Alcohol/week: 0.0 standard drinks of alcohol   Drug use: No   Sexual activity: Not on file  Other Topics Concern   Not on file  Social History Narrative   Married x 67 years.    1 son and 1 daughter.   1 grandson.   Social Drivers of Health   Financial Resource Strain: Medium Risk (10/13/2021)   Overall Financial Resource Strain (CARDIA)    Difficulty of  Paying Living Expenses: Somewhat hard  Food Insecurity: No Food Insecurity (12/17/2023)   Hunger Vital Sign    Worried About Running Out of Food in the Last Year: Never true    Ran Out of Food in the Last Year: Never true  Transportation Needs: No Transportation Needs (12/17/2023)   PRAPARE - Administrator, Civil Service (Medical): No    Lack of Transportation (Non-Medical): No  Physical Activity: Insufficiently Active (10/13/2021)   Exercise Vital Sign    Days of Exercise per Week: 2 days    Minutes of Exercise per Session: 20 min  Stress: No Stress Concern Present (10/13/2021)  [employer] of Occupational Health - Occupational Stress Questionnaire    Feeling of Stress : Not at all  Social Connections: Moderately Integrated (12/17/2023)   Social Connection and Isolation Panel    Frequency of Communication with Friends and Family: More than three times a week    Frequency of Social Gatherings with Friends and Family: More than three times a week    Attends Religious Services: 1 to 4 times per year    Active Member of [employer] or Organizations: No    Attends Banker Meetings: 1 to 4 times per year    Marital Status: Widowed  Intimate Partner Violence: Not At Risk (12/17/2023)   Humiliation, Afraid, Rape, and Kick questionnaire    Fear of Current or Ex-Partner: No    Emotionally Abused: No    Physically Abused: No    Sexually Abused: No    Outpatient Medications Prior to Visit  Medication Sig Dispense Refill   acetaminophen  (TYLENOL ) 500 MG tablet Take 500 mg by mouth every 6 (six) hours as needed for moderate pain (pain score 4-6) or headache.     amiodarone  (PACERONE ) 200 MG tablet TAKE 1 TABLET BY MOUTH EVERY DAY 90 tablet 3   apixaban  (ELIQUIS ) 5 MG TABS tablet TAKE 1 TABLET BY MOUTH TWICE A DAY 180 tablet 1   BD PEN NEEDLE NANO 2ND GEN 32G X 4 MM MISC See admin instructions.     cefadroxil  (DURICEF) 500 MG capsule Take 1 capsule (500 mg total) by mouth 2  (two) times daily.     cephALEXin  (KEFLEX ) 500 MG capsule Take 1 capsule (500 mg total) by mouth 2 (two) times daily. 20 capsule 0   dapagliflozin  propanediol (FARXIGA ) 10 MG TABS tablet TAKE 1 TABLET BY MOUTH EVERY DAY BEFORE BREAKFAST 90 tablet 3   ENTRESTO  24-26 MG TAKE 1 TABLET BY MOUTH TWICE A DAY 180 tablet 8   eplerenone  (INSPRA ) 25 MG tablet Take 1 tablet (25 mg total) by mouth daily. 30 tablet 11   famotidine  (PEPCID ) 20 MG tablet Take 1 tablet (20 mg total) by mouth daily. 90 tablet 0   fenofibrate  (TRICOR ) 145 MG tablet TAKE 1 TABLET BY MOUTH EVERY DAY 90 tablet 3   furosemide  (LASIX ) 40 MG tablet Take 40 mg by mouth 2 (two) times daily. Take 2 tablets by mouth twice daily     gabapentin  (NEURONTIN ) 300 MG capsule Take 300 mg by mouth 2 (two) times daily as needed (pain).     HUMALOG  MIX 75/25 KWIKPEN (75-25) 100 UNIT/ML KwikPen Take 10 units with morning meal and 10 units with evening meal     levothyroxine  (SYNTHROID ) 50 MCG tablet Take 1 tablet (50 mcg total) by mouth daily. 90 tablet 3   LIVALO  4 MG TABS TAKE 1 TABLET BY MOUTH EVERY DAY 90 tablet 1   LORazepam  (ATIVAN ) 0.5 MG tablet Take 1 tablet (0.5 mg total) by mouth at bedtime. 30 tablet 1   metoprolol  succinate (TOPROL -XL) 25 MG 24 hr tablet TAKE 1 TABLET BY MOUTH EVERYDAY AT BEDTIME 90 tablet 3   mexiletine (MEXITIL ) 150 MG capsule TAKE 2 CAPSULES (300 MG TOTAL) BY MOUTH 2 (TWO) TIMES DAILY. 360 capsule 3   Multiple Vitamin (MULTIVITAMIN WITH MINERALS) TABS tablet Take 1 tablet by mouth daily.     ondansetron  (ZOFRAN ) 8 MG tablet Take 1 tablet (8 mg total) by mouth every 8 (eight) hours as needed for nausea or vomiting. 30 tablet 0   ONETOUCH VERIO test strip  1 each 3 (three) times daily.     potassium chloride  (MICRO-K ) 10 MEQ CR capsule Take 1 capsule (10 mEq total) by mouth 2 (two) times daily. 60 capsule 3   sertraline  (ZOLOFT ) 25 MG tablet Take 1 tablet (25 mg total) by mouth daily. 90 tablet 2   VASCEPA  1 g capsule TAKE  2 CAPSULES BY MOUTH TWICE A DAY 120 capsule 5   MOUNJARO 2.5 MG/0.5ML Pen Inject 2.5 mg into the skin once a week. Thursdays (Patient not taking: Reported on 04/07/2024)     No facility-administered medications prior to visit.    Allergies  Allergen Reactions   Neomycin-Bacitracin Zn-Polymyx Rash   Sulfa Antibiotics Diarrhea and Nausea And Vomiting   Ciprofloxacin  Hives, Itching and Rash    Pt doesn't remember having any reaction to cipro    Spironolactone  Rash    Review of Systems  Constitutional:  Negative for fever.  HENT:  Negative for congestion.   Eyes:  Negative for blurred vision.  Respiratory:  Negative for cough.   Cardiovascular:  Negative for chest pain and palpitations.  Gastrointestinal:  Negative for vomiting.  Musculoskeletal:  Negative for back pain.  Skin:  Negative for rash.  Neurological:  Negative for loss of consciousness and headaches.  Psychiatric/Behavioral:  Negative for depression. The patient is not nervous/anxious.        Objective:    Physical Exam Vitals and nursing note reviewed.  Constitutional:      General: She is not in acute distress.    Appearance: Normal appearance. She is well-developed.  Eyes:     General: No scleral icterus.       Right eye: No discharge.        Left eye: No discharge.  Neurological:     General: No focal deficit present.     Mental Status: She is alert and oriented to person, place, and time.  Psychiatric:        Mood and Affect: Mood normal.        Behavior: Behavior normal.        Thought Content: Thought content normal.        Judgment: Judgment normal.     There were no vitals taken for this visit. Wt Readings from Last 3 Encounters:  03/28/24 250 lb (113.4 kg)  03/24/24 248 lb (112.5 kg)  03/10/24 248 lb (112.5 kg)       Assessment & Plan:  There are no diagnoses linked to this encounter.   I discussed the assessment and treatment plan with the patient. The patient was provided an opportunity  to ask questions and all were answered. The patient agreed with the plan and demonstrated an understanding of the instructions.   The patient was advised to call back or seek an in-person evaluation if the symptoms worsen or if the condition fails to improve as anticipated. 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 Cardiologist advised against removal due to the risk of fatal myocardial infarction. Consult with Dr. Buddie in early October for evaluation and management. Consider placing an additional lead beside the existing one as suggested by cardiology.  Nausea and Insomnia   Nausea and insomnia have improved since August. Continue Zofran  and famotidine  as prescribed.  Depression and Anxiety Disorder   Depression and anxiety are well-managed with current treatment. She is not taking lorazepam  every night and feels the current sertraline  dose is adequate. Continue sertraline  and  lorazepam  as prescribed.   Jamee JONELLE Antonio Cyndee, DO [facility] [phone] (phone) [phone] (fax)  [facility]

## 2024-04-08 DIAGNOSIS — I130 Hypertensive heart and chronic kidney disease with heart failure and stage 1 through stage 4 chronic kidney disease, or unspecified chronic kidney disease: Secondary | ICD-10-CM | POA: Diagnosis not present

## 2024-04-08 DIAGNOSIS — I480 Paroxysmal atrial fibrillation: Secondary | ICD-10-CM | POA: Diagnosis not present

## 2024-04-08 DIAGNOSIS — I472 Ventricular tachycardia, unspecified: Secondary | ICD-10-CM | POA: Diagnosis not present

## 2024-04-08 DIAGNOSIS — M19031 Primary osteoarthritis, right wrist: Secondary | ICD-10-CM | POA: Diagnosis not present

## 2024-04-08 DIAGNOSIS — Z7984 Long term (current) use of oral hypoglycemic drugs: Secondary | ICD-10-CM | POA: Diagnosis not present

## 2024-04-08 DIAGNOSIS — E785 Hyperlipidemia, unspecified: Secondary | ICD-10-CM | POA: Diagnosis not present

## 2024-04-08 DIAGNOSIS — I5042 Chronic combined systolic (congestive) and diastolic (congestive) heart failure: Secondary | ICD-10-CM | POA: Diagnosis not present

## 2024-04-08 DIAGNOSIS — E039 Hypothyroidism, unspecified: Secondary | ICD-10-CM | POA: Diagnosis not present

## 2024-04-08 DIAGNOSIS — E1151 Type 2 diabetes mellitus with diabetic peripheral angiopathy without gangrene: Secondary | ICD-10-CM | POA: Diagnosis not present

## 2024-04-08 DIAGNOSIS — N183 Chronic kidney disease, stage 3 unspecified: Secondary | ICD-10-CM | POA: Diagnosis not present

## 2024-04-08 DIAGNOSIS — Z7901 Long term (current) use of anticoagulants: Secondary | ICD-10-CM | POA: Diagnosis not present

## 2024-04-08 DIAGNOSIS — G4733 Obstructive sleep apnea (adult) (pediatric): Secondary | ICD-10-CM | POA: Diagnosis not present

## 2024-04-08 DIAGNOSIS — M19032 Primary osteoarthritis, left wrist: Secondary | ICD-10-CM | POA: Diagnosis not present

## 2024-04-08 DIAGNOSIS — G8929 Other chronic pain: Secondary | ICD-10-CM | POA: Diagnosis not present

## 2024-04-08 DIAGNOSIS — M19041 Primary osteoarthritis, right hand: Secondary | ICD-10-CM | POA: Diagnosis not present

## 2024-04-08 DIAGNOSIS — J449 Chronic obstructive pulmonary disease, unspecified: Secondary | ICD-10-CM | POA: Diagnosis not present

## 2024-04-08 DIAGNOSIS — M7021 Olecranon bursitis, right elbow: Secondary | ICD-10-CM | POA: Diagnosis not present

## 2024-04-08 DIAGNOSIS — M25461 Effusion, right knee: Secondary | ICD-10-CM | POA: Diagnosis not present

## 2024-04-08 DIAGNOSIS — I4892 Unspecified atrial flutter: Secondary | ICD-10-CM | POA: Diagnosis not present

## 2024-04-08 DIAGNOSIS — M19042 Primary osteoarthritis, left hand: Secondary | ICD-10-CM | POA: Diagnosis not present

## 2024-04-08 DIAGNOSIS — M5126 Other intervertebral disc displacement, lumbar region: Secondary | ICD-10-CM | POA: Diagnosis not present

## 2024-04-08 DIAGNOSIS — E1122 Type 2 diabetes mellitus with diabetic chronic kidney disease: Secondary | ICD-10-CM | POA: Diagnosis not present

## 2024-04-18 ENCOUNTER — Telehealth (HOSPITAL_COMMUNITY): Payer: Self-pay | Admitting: *Deleted

## 2024-04-18 NOTE — Telephone Encounter (Signed)
 Called to confirm/remind patient of their appointment at the [facility] on  04/21/24:       Appointment:              [x] Confirmed             [] Left mess              [] No answer/No voice mail             [] Phone not in service   Patient reminded to bring all medications and/or complete list.   Confirmed patient has transportation. Gave directions, instructed to utilize valet parking.

## 2024-04-21 ENCOUNTER — Other Ambulatory Visit (HOSPITAL_COMMUNITY)

## 2024-04-21 ENCOUNTER — Ambulatory Visit (HOSPITAL_COMMUNITY)
Admission: RE | Admit: 2024-04-21 | Discharge: 2024-04-21 | Disposition: A | Source: Ambulatory Visit | Attending: Family Medicine

## 2024-04-21 ENCOUNTER — Encounter (HOSPITAL_COMMUNITY): Payer: Self-pay

## 2024-04-21 VITALS — BP 108/66 | HR 67 | Wt 252.0 lb

## 2024-04-21 DIAGNOSIS — G4733 Obstructive sleep apnea (adult) (pediatric): Secondary | ICD-10-CM | POA: Diagnosis not present

## 2024-04-21 DIAGNOSIS — Z794 Long term (current) use of insulin: Secondary | ICD-10-CM | POA: Insufficient documentation

## 2024-04-21 DIAGNOSIS — Z79890 Hormone replacement therapy (postmenopausal): Secondary | ICD-10-CM | POA: Insufficient documentation

## 2024-04-21 DIAGNOSIS — I472 Ventricular tachycardia, unspecified: Secondary | ICD-10-CM | POA: Insufficient documentation

## 2024-04-21 DIAGNOSIS — Z7901 Long term (current) use of anticoagulants: Secondary | ICD-10-CM | POA: Insufficient documentation

## 2024-04-21 DIAGNOSIS — I480 Paroxysmal atrial fibrillation: Secondary | ICD-10-CM | POA: Diagnosis not present

## 2024-04-21 DIAGNOSIS — Z6841 Body Mass Index (BMI) 40.0 and over, adult: Secondary | ICD-10-CM | POA: Insufficient documentation

## 2024-04-21 DIAGNOSIS — I5032 Chronic diastolic (congestive) heart failure: Secondary | ICD-10-CM | POA: Diagnosis not present

## 2024-04-21 DIAGNOSIS — Z87448 Personal history of other diseases of urinary system: Secondary | ICD-10-CM | POA: Insufficient documentation

## 2024-04-21 DIAGNOSIS — E039 Hypothyroidism, unspecified: Secondary | ICD-10-CM | POA: Diagnosis not present

## 2024-04-21 DIAGNOSIS — I739 Peripheral vascular disease, unspecified: Secondary | ICD-10-CM | POA: Diagnosis not present

## 2024-04-21 DIAGNOSIS — I5030 Unspecified diastolic (congestive) heart failure: Secondary | ICD-10-CM | POA: Diagnosis not present

## 2024-04-21 DIAGNOSIS — E1122 Type 2 diabetes mellitus with diabetic chronic kidney disease: Secondary | ICD-10-CM | POA: Diagnosis not present

## 2024-04-21 DIAGNOSIS — E1151 Type 2 diabetes mellitus with diabetic peripheral angiopathy without gangrene: Secondary | ICD-10-CM | POA: Insufficient documentation

## 2024-04-21 DIAGNOSIS — Z7985 Long-term (current) use of injectable non-insulin antidiabetic drugs: Secondary | ICD-10-CM | POA: Diagnosis not present

## 2024-04-21 DIAGNOSIS — Z8719 Personal history of other diseases of the digestive system: Secondary | ICD-10-CM | POA: Diagnosis not present

## 2024-04-21 DIAGNOSIS — Z79899 Other long term (current) drug therapy: Secondary | ICD-10-CM | POA: Insufficient documentation

## 2024-04-21 DIAGNOSIS — I2510 Atherosclerotic heart disease of native coronary artery without angina pectoris: Secondary | ICD-10-CM | POA: Insufficient documentation

## 2024-04-21 DIAGNOSIS — M51369 Other intervertebral disc degeneration, lumbar region without mention of lumbar back pain or lower extremity pain: Secondary | ICD-10-CM | POA: Insufficient documentation

## 2024-04-21 DIAGNOSIS — J449 Chronic obstructive pulmonary disease, unspecified: Secondary | ICD-10-CM | POA: Diagnosis not present

## 2024-04-21 DIAGNOSIS — I110 Hypertensive heart disease with heart failure: Secondary | ICD-10-CM | POA: Diagnosis not present

## 2024-04-21 DIAGNOSIS — I5022 Chronic systolic (congestive) heart failure: Secondary | ICD-10-CM | POA: Diagnosis present

## 2024-04-21 DIAGNOSIS — Z7984 Long term (current) use of oral hypoglycemic drugs: Secondary | ICD-10-CM | POA: Diagnosis not present

## 2024-04-21 DIAGNOSIS — Z87891 Personal history of nicotine dependence: Secondary | ICD-10-CM | POA: Diagnosis not present

## 2024-04-21 DIAGNOSIS — E785 Hyperlipidemia, unspecified: Secondary | ICD-10-CM | POA: Diagnosis not present

## 2024-04-21 DIAGNOSIS — N1832 Chronic kidney disease, stage 3b: Secondary | ICD-10-CM | POA: Diagnosis not present

## 2024-04-21 DIAGNOSIS — R0602 Shortness of breath: Secondary | ICD-10-CM | POA: Diagnosis present

## 2024-04-21 DIAGNOSIS — E669 Obesity, unspecified: Secondary | ICD-10-CM | POA: Diagnosis not present

## 2024-04-21 LAB — CBC
HCT: 50.6 % — ABNORMAL HIGH (ref 36.0–46.0)
Hemoglobin: 15.9 g/dL — ABNORMAL HIGH (ref 12.0–15.0)
MCH: 30 pg (ref 26.0–34.0)
MCHC: 31.4 g/dL (ref 30.0–36.0)
MCV: 95.5 fL (ref 80.0–100.0)
Platelets: 152 K/uL (ref 150–400)
RBC: 5.3 MIL/uL — ABNORMAL HIGH (ref 3.87–5.11)
RDW: 15.4 % (ref 11.5–15.5)
WBC: 7.4 K/uL (ref 4.0–10.5)
nRBC: 0 % (ref 0.0–0.2)

## 2024-04-21 LAB — COMPREHENSIVE METABOLIC PANEL WITH GFR
ALT: 14 U/L (ref 0–44)
AST: 35 U/L (ref 15–41)
Albumin: 3.4 g/dL — ABNORMAL LOW (ref 3.5–5.0)
Alkaline Phosphatase: 52 U/L (ref 38–126)
Anion gap: 13 (ref 5–15)
BUN: 14 mg/dL (ref 8–23)
CO2: 27 mmol/L (ref 22–32)
Calcium: 8.1 mg/dL — ABNORMAL LOW (ref 8.9–10.3)
Chloride: 99 mmol/L (ref 98–111)
Creatinine, Ser: 1.15 mg/dL — ABNORMAL HIGH (ref 0.44–1.00)
GFR, Estimated: 48 mL/min — ABNORMAL LOW (ref >60)
Glucose, Bld: 103 mg/dL — ABNORMAL HIGH (ref 70–99)
Potassium: 3.5 mmol/L (ref 3.5–5.1)
Sodium: 139 mmol/L (ref 135–145)
Total Bilirubin: 1 mg/dL (ref 0.0–1.2)
Total Protein: 7.2 g/dL (ref 6.5–8.1)

## 2024-04-21 LAB — T4, FREE: Free T4: 1.34 ng/dL — ABNORMAL HIGH (ref 0.61–1.12)

## 2024-04-21 LAB — TSH: TSH: 12.946 u[IU]/mL — ABNORMAL HIGH (ref 0.350–4.500)

## 2024-04-21 MED ORDER — DAPAGLIFLOZIN PROPANEDIOL 10 MG PO TABS: ORAL_TABLET | ORAL | 3 refills | Status: DC

## 2024-04-21 NOTE — Progress Notes (Signed)
 [facility]   PCP:  [name], DO  HF Cardiologist: Dr. Rolan  HPI: Heidi Stark is a 83 y.o. 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   She has left leg pain with ambulation after about 100 feet, LE dopplers 5/15 showed occluded left external iliac artery. This has been present ever since she had left iliac damage with a prior back surgery.     Admitted to [facility] 1/18 with AF/RVR and acute systolic CHF.  EF was 30-35%, down from 50-55% in 2015. Underwent DCCV back to [facility].  She subsequently had atrial fibrillation ablation in 4/18.  She went back into AF in 5/19 and had DCCV back to [facility].   Echo 6/19 with EF 55%, mild LVH, moderate diastolic dysfunction, normal RV size and systolic function.    Admitted for recurrent AF 10/19 to start Tikosyn . She was cardioverted back to NSR. Tikosyn  stopped in 2/22 d/t VT and presyncope. Started on amio.  EF at the time 50-55%, but cMRI showed EF 40% with subendocardial scar in the basal to mid inferolateral wall, RV EF was 38%. LHC without obstructive disease.   Readmitted 2/22 with presyncopal events. No VT but continued atypical AF. S/p Medtronic ICD 3/22. Discharge weight 256 lbs.   ERAF at follow up s/p another DCCV 3/22.  Multiple episodes of VT on 2/23, received ATP but no shocks. Started on mexiletine 250 bid.  On 5/23, she had VT with ATP and 1 ICD shock. Had been off amio and mex.  Echo in 8/23 showed EF 60-65%, moderate LVH, normal RV, normal IVC.   She was noted to be in AF in 4/24, back in NSR when she came in for DCCV.   Admitted 4/25 with R knee effusion. Admitted 6/25 with R elbow cellulitis. Echo showed EF 70-75%, RV not well visualized.  Today she returns for HF follow up with her daughter, with whom she lives. Her husband of 60 years passed 03/2023. Overall feeling fine. She has mild SOB with ADLs, uses a WC when out of the house. She is  physically limited by DDD. Denies palpitations, abnormal bleeding, CP, dizziness, edema, or PND/Orthopnea. Appetite ok. Weight at home 252 pounds. Taking all medications. She has RV lead malfunction, saw EP and planning RV lead abandonment and implantation of new lead.   ECG (personally reviewed): none ordered today.  Labs (4/24): K 4.2, creatinine 1.54, TSH low at 0.086, free T4 elevated (Synthroid  stopped) Labs (10/24); TSH elevated 14.7 Labs (8/25): K 2.9, creatinine 0.82  PMH: 1.Type II diabetes 2. HTN 3. Hyperlipidemia 4. COPD: has quit smoking.  5. CAD: Coronary CTA (4/18) with nonobstructive coronary disease, calcium  score 335 Agatston units (82nd percentile).  - Coronary angiography (2/22) with no obstructive disease 6. Paroxysmal atrial fibrillation: First noted in 4/15.  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   - Event monitor (4/15) with no atrial fibrillation.  - Admission with atrial fibrillation/RVR in 1/18.  - Atrial fibrillation ablation in 4/18.  - DCCV in 5/19.  - Tikosyn  started + DCCV in 10/19. VT => Tikosyn  stopped and amiodarone  begun in 3/22.   - S/p single chamber MDT ICD 09/15/20 - DCCV to NSR in 3/22.  7. Chronic systolic CHF: ?tachycardia-mediated as first noted in setting of atrial fibrillation with RVR.  - TEE (4/15) with EF 50-55%, mild LVH, grade III-IV plaque in descending thoracic aorta, RV  normal, no PFO, peak RV-RA gradient 42 mmHg.   - Echo (1/18) with EF 30-35%, diffuse hypokinesis, moderate LVH, mildly dilated RV, PASP 55 mmHg.  - Echo (4/18) with EF 50%, anterolateral/inferolateral mild hypokinesis, mild aortic stenosis, IVC normal.  - Echo (6/19) with EF 55%, mild LVH, moderate diastolic dysfunction, normal RV size and systolic function.  - cMRI (2/22) with EF 40% with subendocardial scar in the basal to mid inferolateral wall, RV EF was 38%. - Echo (2/22) with EF 50-55%, normal RV.  - Echo (8/23): EF  60-65%, moderate LVH, normal RV, normal IVC.  - Echo 6/25: EF 70-75%, RV not well visualized, normal IVC 7. H/o diskectomy. 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 Lower extremity arterial dopplers (5/15) with occluded left external iliac artery.  - ABIs (5/18): 1.16 (normal) on right, 0.56 on left.  - CTA (5/24): Right anterior tibial and peroneal arteries occluded, left CIA/EIA/internal iliac artery occluded with reconstitution of SFA.  9. Aortic stenosis: Mild on 4/18 echo.  10. OSA: Uses CPAP 11. VT: 2/22.  No significant CAD on cath in 2/22.  cMRI with EF 40% with subendocardial scar in the basal to mid inferolateral wall, RV EF was 38%. - Medtronic ICD placed 3/22.  12. COVID-19 9/22 13. Hypothyroidism => hyperthyroidism   Current Outpatient Medications  Medication Sig Dispense Refill   acetaminophen  (TYLENOL ) 500 MG tablet Take 500 mg by mouth every 6 (six) hours as needed for moderate pain (pain score 4-6) or headache.     amiodarone  (PACERONE ) 200 MG tablet TAKE 1 TABLET BY MOUTH EVERY DAY 90 tablet 3   apixaban  (ELIQUIS ) 5 MG TABS tablet TAKE 1 TABLET BY MOUTH TWICE A DAY 180 tablet 1   BD PEN NEEDLE NANO 2ND GEN 32G X 4 MM MISC See admin instructions.     dapagliflozin  propanediol (FARXIGA ) 10 MG TABS tablet TAKE 1 TABLET BY MOUTH EVERY DAY BEFORE BREAKFAST 90 tablet 3   ENTRESTO  24-26 MG TAKE 1 TABLET BY MOUTH TWICE A DAY 180 tablet 8   eplerenone  (INSPRA ) 25 MG tablet Take 1 tablet (25 mg total) by mouth daily. 30 tablet 11   famotidine  (PEPCID ) 20 MG tablet Take 1 tablet (20 mg total) by mouth daily. 90 tablet 0   fenofibrate  (TRICOR ) 145 MG tablet TAKE 1 TABLET BY MOUTH EVERY DAY 90 tablet 3   furosemide  (LASIX ) 40 MG tablet Take 40 mg by mouth 2 (two) times daily. Take 2 tablets by mouth twice daily     gabapentin  (NEURONTIN ) 300 MG capsule Take 300 mg by mouth 2 (two) times daily as needed (pain).     HUMALOG  MIX 75/25 KWIKPEN (75-25) 100 UNIT/ML  KwikPen Take 10 units with morning meal and 10 units with evening meal     levothyroxine  (SYNTHROID ) 50 MCG tablet Take 1 tablet (50 mcg total) by mouth daily. 90 tablet 3   LIVALO  4 MG TABS TAKE 1 TABLET BY MOUTH EVERY DAY 90 tablet 1   LORazepam  (ATIVAN ) 0.5 MG tablet Take 1 tablet (0.5 mg total) by mouth at bedtime. 30 tablet 1   metoprolol  succinate (TOPROL -XL) 25 MG 24 hr tablet TAKE 1 TABLET BY MOUTH EVERYDAY AT BEDTIME 90 tablet 3   mexiletine (MEXITIL ) 150 MG capsule TAKE 2 CAPSULES (300 MG TOTAL) BY MOUTH 2 (TWO) TIMES DAILY. 360 capsule 3   MOUNJARO 2.5 MG/0.5ML Pen Inject 2.5 mg into the skin once a week. Thursdays     Multiple Vitamin (  MULTIVITAMIN WITH MINERALS) TABS tablet Take 1 tablet by mouth daily.     ondansetron  (ZOFRAN ) 8 MG tablet Take 1 tablet (8 mg total) by mouth every 8 (eight) hours as needed for nausea or vomiting. 30 tablet 0   ONETOUCH VERIO test strip 1 each 3 (three) times daily.     potassium chloride  (MICRO-K ) 10 MEQ CR capsule Take 1 capsule (10 mEq total) by mouth 2 (two) times daily. 60 capsule 3   sertraline  (ZOLOFT ) 25 MG tablet Take 1 tablet (25 mg total) by mouth daily. 90 tablet 2   VASCEPA  1 g capsule TAKE 2 CAPSULES BY MOUTH TWICE A DAY 120 capsule 5   cefadroxil  (DURICEF) 500 MG capsule Take 1 capsule (500 mg total) by mouth 2 (two) times daily.     cephALEXin  (KEFLEX ) 500 MG capsule Take 1 capsule (500 mg total) by mouth 2 (two) times daily. 20 capsule 0   No current facility-administered medications for this encounter.    Allergies:   Neomycin-bacitracin zn-polymyx, Sulfa antibiotics, Ciprofloxacin , and Spironolactone    Social History:  The patient  reports that she quit smoking about 3 years ago. Her smoking use included cigarettes. She started smoking about 53 years ago. She has a 50 pack-year smoking history. She has never used smokeless tobacco. She reports that she does not drink alcohol and does not use drugs.   Family History:  The  patient's family history includes Cancer in her mother; Diabetes in her father, mother, and another family member; Factor V Leiden deficiency in her daughter; Hypertension in her mother; Melanoma in an other family member; Obesity in her father and mother.   BP 108/66   Pulse 67   Wt 114.3 kg (252 lb)   SpO2 95%   BMI 40.67 kg/m   Wt Readings from Last 3 Encounters:  04/21/24 114.3 kg (252 lb)  03/28/24 113.4 kg (250 lb)  03/24/24 112.5 kg (248 lb)   PHYSICAL EXAM: General:  NAD. No resp difficulty, arrived in [facility], chronically-ill appearing HEENT: Normal Neck: Supple. No JVD. Thick neck Cor: Irregular rate & rhythm. No rubs, gallops or murmurs. Lungs: Clear, diminished in bases Abdomen: Soft, obese, nontender, nondistended.  Extremities: No cyanosis, clubbing, rash, edema Neuro: Alert & oriented x 3, moves all 4 extremities w/o difficulty. Affect pleasant.  ASSESSMENT AND PLAN: 1. 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 Had been on Tikosyn  but this was not the typical Tikosyn -induced polymorphic VT and she had had no changes in Tikosyn  dosing prior to occurrence. cMRI showed subendocardial LGE in the basal to mid inferolateral wall. This appeared to be a coronary disease pattern however coronary angiogram did not show significant coronary disease. She was transitioned from Tikosyn  to amiodarone  and got Medtronic single chamber ICD on 09/15/20. VT on 2/23 and was started on mexiletine. VT again on 5/23 after missing amiodarone  and mexiletine.  - Continue amiodarone  200 mg daily. Check TSH and LFTs today. Gets regular eye exams.  - Continue mexiletine 300 mg bid. - Follows with EP 2. Atrial fibrillation/flutter: Paroxysmal. 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  She had atrial fibrillation ablation in 4/18.  In 5/19, she went into atrial fibrillation transiently. In 10/19, she was started on Tikosyn  and DCCV.  Now off  Tikosyn  and on amiodarone . NSR on EKG 9/17. Multiple DCCVs with ERAF. Asymptomatic, rate controlled.  - Followed by Dr. Inocencio - Continue amiodarone  200 mg daily - Continue Eliquis  5 mg  bid. Denies abnormal bleeding. (Dose reduction pending renal function, currently Cr <1.5). CBC today. 3. PAD: Left leg claudication with absent left PT pulse.  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 CTA in 5/24 showed right anterior tibial and peroneal arteries occluded, left CIA/EIA/internal iliac artery occluded with reconstitution of SFA.  - Follows w [facility] (Dr. Sheree).  4. Chronic diastolic CHF: EF 69-64% on echo in 1/18, down from 50-55% in 4/15. 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 In 2/22, cMRI showed LV EF 40% with RV E 38%, inferolateral subendocardial LGE, but echo in 2/22 showed EF 50-55%.  Echo in 8/23 showed EF 60-65%, moderate LVH, normal RV, normal IVC.  Echo 6/25 showed EF 70-75%. NYHA III, confounded by age and deconditioning. She is not volume overloaded. - Continue Entresto  24/26 mg bid. BMET today. - Continue Lasix  40 mg bid + 20 KCL daily. - Continue Toprol  XL 25 mg daily.   - Continue eplerenone  25 mg daily.  - Continue dapagliflozin  10 mg daily.  5. CAD: Coronary CTA in 2018 showed coronary calcium  score in the 82nd percentile with nonobstructive coronary disease. Cath in 2/22 showed only luminal irregularities. No chest pain. - Not on ASA due to Eliquis .  - Continue Vascepa  and Livalo . LDL 83, TG 307 (3/25).  6. COPD:  History of smoking. Quit 2/22.  7. OSA: Continue CPAP.  8. Obesity: Body mass index is 40.67 kg/m. - She is on tirzepatide. 9. CKD Stage IIIb: Baseline SCr ~1.4-1.6. BMET today. 10. Thyroid : H/o hypothyroidism on Synthroid .  - check TSH and free T4 today  Follow up in 6 months with Dr. Rolan Harlene CHRISTELLA Glena, FNP  04/21/2024

## 2024-04-21 NOTE — Patient Instructions (Signed)
 Medication Changes:  FARXIGA  REFILLED   Lab Work:  Labs done today, your results will be available in MyChart, we will contact you for abnormal readings.  Follow-Up in: 6 MONTHS WITH DR. ROLAN PLEASE CALL OUR OFFICE AROUND FEBRUARY TO GET SCHEDULED FOR YOUR APPOINTMENT. PHONE NUMBER IS [phone] OPTION 2   At the [facility], you and your health needs are our priority. We have a designated team specialized in the treatment of Heart Failure. This Care Team includes your primary Heart Failure Specialized Cardiologist (physician), Advanced Practice Providers (APPs- Physician Assistants and Nurse Practitioners), and Pharmacist who all work together to provide you with the care you need, when you need it.   You may see any of the following providers on your designated Care Team at your next follow up:  Dr. Toribio Fuel Dr. Ezra ROLAN Dr. Ria Commander Dr. Odis Brownie Greig Mosses, NP Caffie Shed, [place] [facility] [place], [place] Beckey Coe, NP [place] Lee, NP Tinnie Redman, PharmD   Please be sure to bring in all your medications bottles to every appointment.   Need to Contact Us :  If you have any questions or concerns before your next appointment please send us  a message through [place] or call our office at [phone].    TO LEAVE A MESSAGE FOR THE NURSE SELECT OPTION 2, PLEASE LEAVE A MESSAGE INCLUDING: YOUR NAME DATE OF BIRTH CALL BACK NUMBER REASON FOR CALL**this is important as we prioritize the call backs  YOU WILL RECEIVE A CALL BACK THE SAME DAY AS LONG AS YOU CALL BEFORE 4:00 PM

## 2024-04-22 ENCOUNTER — Ambulatory Visit (HOSPITAL_COMMUNITY): Payer: Self-pay | Admitting: Family Medicine

## 2024-04-23 ENCOUNTER — Other Ambulatory Visit (HOSPITAL_COMMUNITY): Payer: Self-pay | Admitting: Cardiology

## 2024-04-23 MED ORDER — DAPAGLIFLOZIN PROPANEDIOL 10 MG PO TABS: ORAL_TABLET | ORAL | 3 refills | Status: DC

## 2024-04-24 ENCOUNTER — Encounter: Payer: Self-pay | Admitting: Cardiology

## 2024-04-24 ENCOUNTER — Ambulatory Visit: Attending: Cardiology | Admitting: Cardiology

## 2024-04-24 VITALS — BP 134/81 | HR 67

## 2024-04-24 DIAGNOSIS — D6869 Other thrombophilia: Secondary | ICD-10-CM | POA: Diagnosis not present

## 2024-04-24 DIAGNOSIS — I472 Ventricular tachycardia, unspecified: Secondary | ICD-10-CM | POA: Diagnosis not present

## 2024-04-24 DIAGNOSIS — T82110D Breakdown (mechanical) of cardiac electrode, subsequent encounter: Secondary | ICD-10-CM | POA: Diagnosis not present

## 2024-04-24 DIAGNOSIS — I4819 Other persistent atrial fibrillation: Secondary | ICD-10-CM | POA: Diagnosis not present

## 2024-04-24 DIAGNOSIS — Z01812 Encounter for preprocedural laboratory examination: Secondary | ICD-10-CM | POA: Diagnosis not present

## 2024-04-24 DIAGNOSIS — I2510 Atherosclerotic heart disease of native coronary artery without angina pectoris: Secondary | ICD-10-CM

## 2024-04-24 DIAGNOSIS — Z79899 Other long term (current) drug therapy: Secondary | ICD-10-CM | POA: Diagnosis not present

## 2024-04-24 LAB — CUP PACEART INCLINIC DEVICE CHECK
Date Time Interrogation Session: 20251009130700
Implantable Lead Connection Status: 753985
Implantable Lead Implant Date: 20220302
Implantable Lead Location: 753860
Implantable Pulse Generator Implant Date: 20220302
Lead Channel Impedance Value: 2755 Ohm
Lead Channel Impedance Value: 2755 Ohm
Lead Channel Pacing Threshold Amplitude: 1.25 V
Lead Channel Pacing Threshold Pulse Width: 0.4 ms
Lead Channel Sensing Intrinsic Amplitude: 7.6 mV

## 2024-04-24 NOTE — Patient Instructions (Signed)
 Medication Instructions:  Your physician recommends that you continue on your current medications as directed. Please refer to the Current Medication list given to you today.  *If you need a refill on your cardiac medications before your next appointment, please call your pharmacy*  Lab Work: None ordered  If you have any lab test that is abnormal or we need to change your treatment, we will call you to review the results.  Testing/Procedures: You are scheduled for an RV lead revision on 08/06/2023, the office will be in touch with instructions and send via mychart.  Follow-Up: At [facility], you and your health needs are our priority.  As part of our continuing mission to provide you with exceptional heart care, our providers are all part of one team.  This team includes your primary Cardiologist (physician) and Advanced Practice Providers or APPs (Physician Assistants and Nurse Practitioners) who all work together to provide you with the care you need, when you need it.  Your next appointment:   2 week(s) after your lead revision  Provider:   Device clinic for a wound check     Thank you for choosing [facility]!!   Maeola Domino, RN [phone]

## 2024-04-24 NOTE — Progress Notes (Signed)
 Electrophysiology Office Note:   Date:  04/24/2024  ID:  Heidi Stark, DOB [birth year], MRN [id number]  Primary Cardiologist: Ezra Shuck, MD Primary Heart Failure: None Electrophysiologist: Leighanne Adolph Gladis Norton, MD      History of Present Illness:   Heidi Stark is a 83 y.o. female with h/o atrial fibrillation, VT, coronary artery disease seen today for routine electrophysiology followup.   Since last being seen in our clinic the patient reports doing overall well.  She has no acute complaints at this time.  She is in atrial fibrillation at the moment.  She does not necessarily have symptoms.  She is in a wheelchair..  she denies chest pain, palpitations, dyspnea, PND, orthopnea, nausea, vomiting, dizziness, syncope, edema, weight gain, or early satiety.   Review of systems complete and found to be negative unless listed in HPI.      EP Information / Studies Reviewed:    EKG is not ordered today. EKG from 03/28/2024 reviewed which showed atrial fibrillation, right bundle branch block      ICD Interrogation-  reviewed in detail today,  See PACEART report.  Device History: Medtronic Single Chamber ICD implanted 09/15/2020 for ventricular tachycardia History of appropriate therapy: Yes History of AAD therapy: Yes; currently on amiodarone    Risk Assessment/Calculations:    CHA2DS2-VASc Score = 9   This indicates a 12.2% annual risk of stroke. The patient's score is based upon: CHF History: 1 HTN History: 1 Diabetes History: 1 Stroke History: 2 (history of splenic and renal infarcts felt to be cardioembolic) Vascular Disease History: 1 Age Score: 2 Gender Score: 1            Physical Exam:   VS:  BP 134/81 (BP Location: Left Arm, Patient Position: Sitting, Cuff Size: Large)   Pulse 67   SpO2 94%    Wt Readings from Last 3 Encounters:  04/21/24 252 lb (114.3 kg)  03/28/24 250 lb (113.4 kg)  03/24/24 248 lb (112.5 kg)     GEN: Well nourished, well developed in no  acute distress NECK: No JVD; No carotid bruits CARDIAC: Regular rate and rhythm, no murmurs, rubs, gallops RESPIRATORY:  Clear to auscultation without rales, wheezing or rhonchi  ABDOMEN: Soft, non-tender, non-distended EXTREMITIES:  No edema; No deformity   ASSESSMENT AND PLAN:    Ventricular tachycardia s/p Medtronic single chamber ICD with ICD lead failure euvolemic today Stable on an appropriate medical regimen Normal ICD function See Pace Art report RV lead has apparent fracture and the pace/sense component.  Capture threshold is elevated.  She has no pacing leads, though due to noise, she may receive inappropriate shocks.  Heidi Stark plan for RV lead revision.  Heidi Stark abandon her existing lead.  Explained risks, benefits, and alternatives to ICD implantation, including but not limited to bleeding, infection, pneumothorax, pericardial effusion, lead dislodgement, heart attack, stroke, or death.  Pt verbalized understanding and agrees to proceed.  Can hold Eliquis  for 2 days prior to procedure.  2.  Persistent atrial fibrillation: On amiodarone .  Minimal noted on device interrogation.  3.  Ventricular tachycardia: On amiodarone .  Continue with current management.  4.  Coronary artery disease: Elevated calcium  score.  Continue Crestor  per primary cardiology.  5.  Secondary hypercoagulable state: On Eliquis   6.  High risk medication monitoring: On amiodarone .  TSH significantly elevated.  LFTs normal.  Due to her history of ventricular tachycardia, Heidi Stark likely need to treat her thyroid  disease and continue amiodarone .  Disposition:   Follow up  with EP Team as usual post procedure   Signed, Devonn Giampietro Gladis Norton, MD

## 2024-04-25 ENCOUNTER — Ambulatory Visit: Payer: Self-pay | Admitting: Cardiology

## 2024-04-25 ENCOUNTER — Ambulatory Visit: Admitting: Family

## 2024-04-25 ENCOUNTER — Ambulatory Visit: Payer: Self-pay

## 2024-04-25 ENCOUNTER — Other Ambulatory Visit (HOSPITAL_BASED_OUTPATIENT_CLINIC_OR_DEPARTMENT_OTHER): Payer: Self-pay

## 2024-04-25 DIAGNOSIS — S90522A Blister (nonthermal), left ankle, initial encounter: Secondary | ICD-10-CM | POA: Diagnosis not present

## 2024-04-25 DIAGNOSIS — L89622 Pressure ulcer of left heel, stage 2: Secondary | ICD-10-CM

## 2024-04-25 DIAGNOSIS — L03116 Cellulitis of left lower limb: Secondary | ICD-10-CM

## 2024-04-25 MED ORDER — CEPHALEXIN 500 MG PO CAPS
500.0000 mg | ORAL_CAPSULE | Freq: Two times a day (BID) | ORAL | 0 refills | Status: DC
  Filled 2024-04-25: qty 14, 7d supply, fill #0

## 2024-04-25 NOTE — Progress Notes (Signed)
 Subjective:     Patient ID: Heidi Stark, female    DOB: [birth year], 83 y.o.   MRN: [id number]  Chief Complaint  Patient presents with   Blister    Patient reports blister on left ankle    HPI  Discussed the use of AI scribe software for clinical note transcription with the patient, who gave verbal consent to proceed.  History of Present Illness Heidi Stark is an 83 year old female with poor circulation who presents with skin infections and dry skin. She is accompanied by her daughter.  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 This has resulted in several episodes of cellulitis, bursitis, and sepsis, requiring hospitalization and intravenous antibiotics.  Recently, she developed a large, red, and angry spot on her skin, which appeared suddenly this morning. She has been on antibiotics previously for similar issues, which seemed to reduce the redness and severity of the spots. Her daughter notes that the spots have become redder since stopping the antibiotics.  She experiences dry skin, particularly on her feet, and has a history of pressure ulcers on her heels. She manages this by using a pillow to elevate her feet.  Her medication history includes the use of Keflex  for cellulitis, which she tolerated well during her last episode a month ago, at a dose of 100 mg twice daily. Her feet are always a rosy color due to poor circulation. No new symptoms beyond the skin issues discussed.      Health Maintenance Due  Topic Date Due   COVID-19 Vaccine (1) Never done   Zoster Vaccines- Shingrix (1 of 2) 01/06/1961   Diabetic kidney evaluation - Urine ACR  08/05/2011   FOOT EXAM  07/06/2016   OPHTHALMOLOGY EXAM  08/17/2021   Medicare Annual Wellness (AWV)  10/14/2022   Influenza Vaccine  02/15/2024    Past Medical  History:  Diagnosis Date   Arthritis    hands, wrists (11/07/2016)   CHF (congestive heart failure) (HCC)    Chronic lower back pain    Constipation    DVT (deep venous thrombosis) (HCC)    History of blood transfusion 1990   related to OR   History of kidney stones    Hyperlipidemia    Hypertension    Joint pain    Lower extremity edema    OSA on CPAP    Persistent atrial fibrillation (HCC)    Pneumonia    several times (11/07/2016)   Type II diabetes mellitus (HCC)     Past Surgical History:  Procedure Laterality Date   ATRIAL FIBRILLATION ABLATION N/A 11/07/2016   Procedure: Atrial Fibrillation Ablation;  Surgeon: Will Gladis Norton, MD;  Location: [facility] INVASIVE CV LAB;  Service: Cardiovascular;  Laterality: N/A;   BACK SURGERY     CARDIAC CATHETERIZATION     CARDIOVERSION N/A 08/11/2016   Procedure: CARDIOVERSION;  Surgeon: Ezra GORMAN Shuck, MD;  Location: [facility] ENDOSCOPY;  Service: Cardiovascular;  Laterality: N/A;   CARDIOVERSION N/A 12/03/2017   Procedure: CARDIOVERSION;  Surgeon: Shuck Ezra GORMAN, MD;  Location: [facility] ENDOSCOPY;  Service: Cardiovascular;  Laterality: N/A;   CARDIOVERSION N/A 05/02/2018   Procedure: CARDIOVERSION;  Surgeon: Mona Vinie BROCKS, MD;  Location: [facility] ENDOSCOPY;  Service: Cardiovascular;  Laterality: N/A;   CARDIOVERSION N/A 06/23/2020   Procedure: CARDIOVERSION;  Surgeon: Hobart Powell BRAVO, MD;  Location: [facility] ENDOSCOPY;  Service: Cardiovascular;  Laterality: N/A;   CARDIOVERSION N/A 10/11/2020   Procedure: CARDIOVERSION;  Surgeon: Rolan Ezra RAMAN, MD;  Location: [facility] ENDOSCOPY;  Service: Cardiovascular;  Laterality: N/A;   CARPAL TUNNEL RELEASE     CATARACT EXTRACTION W/ INTRAOCULAR LENS  IMPLANT, BILATERAL Bilateral    COLON SURGERY  1990   vein graft and colon repair after nicked artery with back surgert   COLONOSCOPY     CORONARY ANGIOGRAPHY N/A 09/08/2020   Procedure: CORONARY ANGIOGRAPHY;  Surgeon: Rolan Ezra RAMAN, MD;  Location: [facility]  INVASIVE CV LAB;  Service: Cardiovascular;  Laterality: N/A;   CYSTECTOMY     between bladder and kidneys   GANGLION CYST EXCISION Left    ICD IMPLANT N/A 09/15/2020   Procedure: ICD IMPLANT;  Surgeon: Inocencio Soyla Lunger, MD;  Location: [facility] INVASIVE CV LAB;  Service: Cardiovascular;  Laterality: N/A;   KNEE CARTILAGE SURGERY Right 1960s   LUMBAR DISC SURGERY  1990   REPAIR ILIAC ARTERY  1990   vein graft and colon repair after nicked artery with back surgert   TEE WITHOUT CARDIOVERSION N/A 10/31/2013   Procedure: TRANSESOPHAGEAL ECHOCARDIOGRAM (TEE);  Surgeon: Ezra RAMAN Rolan, MD;  Location: [facility] ENDOSCOPY;  Service: Cardiovascular;  Laterality: N/A;   TUBAL LIGATION      Family History  Problem Relation Age of Onset   Diabetes Mother    Hypertension Mother    Cancer Mother    Obesity Mother    Diabetes Father    Obesity Father    Diabetes Other    Melanoma Other    Factor V Leiden deficiency Daughter     Social History   Socioeconomic History   Marital status: Married    Spouse name: [name]   Number of children: 2   Years of education: Not on file   Highest education level: Not on file  Occupational History   Occupation: Retired  Tobacco Use   Smoking status: Former    Current packs/day: 0.00    Average packs/day: 1 pack/day for 50.0 years (50.0 ttl pk-yrs)    Types: Cigarettes    Start date: 08/17/1970    Quit date: 08/17/2020    Years since quitting: 3.6   Smokeless tobacco: Never   Tobacco comments:    Quit 2022. Tay  Vaping Use   Vaping status: Never Used  Substance and Sexual Activity   Alcohol use: No    Alcohol/week: 0.0 standard drinks of alcohol   Drug use: No   Sexual activity: Not on file  Other Topics Concern   Not on file  Social History Narrative   Married x 67 years.    1 son and 1 daughter.   1 grandson.   Social Drivers of Health   Financial Resource Strain: Medium Risk (10/13/2021)   Overall Financial Resource Strain (CARDIA)     Difficulty of Paying Living Expenses: Somewhat hard  Food Insecurity: No Food Insecurity (12/17/2023)   Hunger Vital Sign    Worried About Running Out of Food in the Last Year: Never true    Ran Out of Food in the Last Year: Never true  Transportation Needs: No Transportation Needs (12/17/2023)   PRAPARE - Administrator, Civil Service (Medical): No    Lack of Transportation (Non-Medical): No  Physical Activity: Insufficiently Active (10/13/2021)   Exercise Vital Sign    Days of Exercise per Week: 2 days    Minutes of Exercise per Session: 20 min  Stress: No Stress Concern Present (10/13/2021)   [employer] of Occupational Health - Occupational Stress Questionnaire    Feeling of Stress : Not at all  Social Connections: Moderately Integrated (12/17/2023)   Social Connection and Isolation Panel    Frequency of Communication with Friends and Family: More than three times a week    Frequency of Social Gatherings with Friends and Family: More than three times a week    Attends Religious Services: 1 to 4 times per year    Active Member of [employer] or Organizations: No    Attends Banker Meetings: 1 to 4 times per year    Marital Status: Widowed  Intimate Partner Violence: Not At Risk (12/17/2023)   Humiliation, Afraid, Rape, and Kick questionnaire    Fear of Current or Ex-Partner: No    Emotionally Abused: No    Physically Abused: No    Sexually Abused: No    Outpatient Medications Prior to Visit  Medication Sig Dispense Refill   acetaminophen  (TYLENOL ) 500 MG tablet Take 500 mg by mouth every 6 (six) hours as needed for moderate pain (pain score 4-6) or headache.     amiodarone  (PACERONE ) 200 MG tablet TAKE 1 TABLET BY MOUTH EVERY DAY 90 tablet 3   apixaban  (ELIQUIS ) 5 MG TABS tablet TAKE 1 TABLET BY MOUTH TWICE A DAY 180 tablet 1   BD PEN NEEDLE NANO 2ND GEN 32G X 4 MM MISC See admin instructions.     dapagliflozin  propanediol (FARXIGA ) 10 MG TABS tablet TAKE 1  TABLET BY MOUTH EVERY DAY BEFORE BREAKFAST 90 tablet 3   ENTRESTO  24-26 MG TAKE 1 TABLET BY MOUTH TWICE A DAY 180 tablet 8   eplerenone  (INSPRA ) 25 MG tablet Take 1 tablet (25 mg total) by mouth daily. 30 tablet 11   famotidine  (PEPCID ) 20 MG tablet Take 1 tablet (20 mg total) by mouth daily. 90 tablet 0   fenofibrate  (TRICOR ) 145 MG tablet TAKE 1 TABLET BY MOUTH EVERY DAY 90 tablet 3   furosemide  (LASIX ) 40 MG tablet Take 40 mg by mouth 2 (two) times daily. Take 2 tablets by mouth twice daily     gabapentin  (NEURONTIN ) 300 MG capsule Take 300 mg by mouth 2 (two) times daily as needed (pain).     HUMALOG  MIX 75/25 KWIKPEN (75-25) 100 UNIT/ML KwikPen Take 10 units with morning meal and 10 units with evening meal     levothyroxine  (SYNTHROID ) 50 MCG tablet Take 1 tablet (50 mcg total) by mouth daily. 90 tablet 3   LIVALO  4 MG TABS TAKE 1 TABLET BY MOUTH EVERY DAY 90 tablet 1   LORazepam  (ATIVAN ) 0.5 MG tablet Take 1 tablet (0.5 mg total) by mouth at bedtime. 30 tablet 1   metoprolol  succinate (TOPROL -XL) 25 MG 24 hr tablet TAKE 1 TABLET BY MOUTH EVERYDAY AT BEDTIME 90 tablet 3   mexiletine (MEXITIL ) 150 MG capsule TAKE 2 CAPSULES (300 MG TOTAL) BY MOUTH 2 (TWO) TIMES DAILY. 360 capsule 3   MOUNJARO 2.5 MG/0.5ML Pen Inject 2.5 mg into the skin once a week. Thursdays     Multiple Vitamin (MULTIVITAMIN WITH MINERALS) TABS tablet Take 1 tablet by mouth daily.     ondansetron  (ZOFRAN ) 8 MG tablet Take 1 tablet (8 mg total) by mouth every 8 (eight) hours as needed for nausea or vomiting. 30 tablet 0   ONETOUCH VERIO test strip 1 each 3 (three) times daily.     potassium chloride  (MICRO-K ) 10 MEQ CR capsule  Take 1 capsule (10 mEq total) by mouth 2 (two) times daily. 60 capsule 3   sertraline  (ZOLOFT ) 25 MG tablet Take 1 tablet (25 mg total) by mouth daily. 90 tablet 2   VASCEPA  1 g capsule TAKE 2 CAPSULES BY MOUTH TWICE A DAY 120 capsule 5   cefadroxil  (DURICEF) 500 MG capsule Take 1 capsule (500 mg  total) by mouth 2 (two) times daily. (Patient not taking: Reported on 04/24/2024)     cephALEXin  (KEFLEX ) 500 MG capsule Take 1 capsule (500 mg total) by mouth 2 (two) times daily. (Patient not taking: Reported on 04/24/2024) 20 capsule 0   No facility-administered medications prior to visit.    Allergies  Allergen Reactions   Neomycin-Bacitracin Zn-Polymyx Rash   Sulfa Antibiotics Diarrhea and Nausea And Vomiting   Ciprofloxacin  Hives, Itching and Rash    Pt doesn't remember having any reaction to cipro    Spironolactone  Rash    ROS    See HPI Objective:    Physical Exam Constitutional:      General: She is not in acute distress.    Appearance: Normal appearance. She is well-developed.  HENT:     Head: Normocephalic and atraumatic.     Right Ear: External ear normal.     Left Ear: External ear normal.  Eyes:     General: No scleral icterus. Neck:     Thyroid : No thyromegaly.  Cardiovascular:     Rate and Rhythm: Normal rate and regular rhythm.     Heart sounds: Normal heart sounds. No murmur heard. Pulmonary:     Effort: Pulmonary effort is normal. No respiratory distress.     Breath sounds: Normal breath sounds. No wheezing.  Musculoskeletal:     Cervical back: Neck supple.  Skin:    General: Skin is warm and dry.     Comments: Stage 2 pressure ulcers left heel and large blister left lateral ankle  Neurological:     Mental Status: She is alert and oriented to person, place, and time.  Psychiatric:        Mood and Affect: Mood normal.        Behavior: Behavior normal.        Thought Content: Thought content normal.        Judgment: Judgment normal.      BP (!) 125/59 (BP Location: Right Arm, Patient Position: Sitting, Cuff Size: Normal)   Pulse 64   Temp 98.6 F (37 C) (Oral)   Resp 16   SpO2 94%  Wt Readings from Last 3 Encounters:  04/21/24 252 lb (114.3 kg)  03/28/24 250 lb (113.4 kg)  03/24/24 248 lb (112.5 kg)         Assessment & Plan:    Problem List Items Addressed This Visit       Unprioritized   Pressure injury of left heel, stage 2 (HCC) - Primary   She spends a lot of time in her recliner. We discussed floating her heels while in recliner to take pressure of her her heels and allow healing.       Blister of left ankle   Relevant Medications   cephALEXin  (KEFLEX ) 500 MG capsule  Plan empiric rx with Keflex .  She is advised call if increased pain/redness/drainage/odor  Follow up in 1 week.   I have discontinued Heidi HERO. Klassen's cefadroxil . I have also changed her cephALEXin . Additionally, I am having her maintain her multivitamin with minerals, acetaminophen , OneTouch Verio, BD Pen Needle Nano 2nd Gen, gabapentin ,  fenofibrate , metoprolol  succinate, mexiletine, amiodarone , Vascepa , Mounjaro, furosemide , levothyroxine , HumaLOG  Mix 75/25 KwikPen, potassium chloride , Livalo , Eliquis , ondansetron , famotidine , sertraline , LORazepam , eplerenone , Entresto , and dapagliflozin  propanediol.  Meds ordered this encounter  Medications   cephALEXin  (KEFLEX ) 500 MG capsule    Sig: Take 1 capsule (500 mg total) by mouth 2 (two) times daily for 7 days.    Dispense:  14 capsule    Refill:  0    Supervising Provider:   DOMENICA BLACKBIRD A [[id number]]

## 2024-04-25 NOTE — Telephone Encounter (Signed)
 Appt scheduled

## 2024-04-25 NOTE — Telephone Encounter (Signed)
 She was asking if provider would prescribe without being seen. If so, please cancel appt. Advised that if she does not hear back, she should plan to come to the visit.   Reason for Disposition  [1] Blister on foot AND [2] diabetes mellitus or peripheral vascular disease (have poor circulation)  Answer Assessment - Initial Assessment Questions States has recent hx of cellulitis on elbow, pt concerned should be same. Denies discharge. Patient is a diabetic.  1. APPEARANCE of BLISTER: What does it look like?     Size of a quarter, raised  2. SIZE: How large is the blister? (e.g., inches, cm or compare to coins)     Quarter  3. LOCATION: Where are the blisters located?      Left ankle  4. WHEN: When did the blister happen?     This morning  5. CAUSE: What do you think caused the blister?     Unknown  6. PAIN: Does it hurt? If Yes, ask: How bad is the pain?  (Scale 0-10; or none, mild, moderate, severe)     Denies  Protocols used: Blister - Foot and Hand-A-AH Copied from CRM #[id number]. Topic: Clinical - Red Word Triage >> Apr 25, 2024  8:41 AM Turkey A wrote: [facility] that prompted transfer to Nurse Triage: Patient has blister on left ankle that seeping has discomfort to touch and swollen. Noticed this morning

## 2024-04-28 DIAGNOSIS — L89622 Pressure ulcer of left heel, stage 2: Secondary | ICD-10-CM | POA: Insufficient documentation

## 2024-04-28 NOTE — Assessment & Plan Note (Signed)
 She spends a lot of time in her recliner. We discussed floating her heels while in recliner to take pressure of her her heels and allow healing.

## 2024-05-01 ENCOUNTER — Ambulatory Visit: Payer: Self-pay

## 2024-05-01 ENCOUNTER — Other Ambulatory Visit: Payer: Self-pay | Admitting: Family Medicine

## 2024-05-01 DIAGNOSIS — S90522A Blister (nonthermal), left ankle, initial encounter: Secondary | ICD-10-CM

## 2024-05-01 DIAGNOSIS — R110 Nausea: Secondary | ICD-10-CM

## 2024-05-01 MED ORDER — CEPHALEXIN 500 MG PO CAPS
500.0000 mg | ORAL_CAPSULE | Freq: Two times a day (BID) | ORAL | 0 refills | Status: AC
Start: 2024-05-01 — End: 2024-05-04

## 2024-05-01 NOTE — Addendum Note (Signed)
 Addended by: DARYL SETTER on: 05/01/2024 05:17 PM   Modules accepted: Orders

## 2024-05-01 NOTE — Telephone Encounter (Signed)
 Rx sent to [facility] for 3 more days.  Let's bring her back in on Monday for a follow up visit with PCP and can extend further if needed at that time.

## 2024-05-01 NOTE — Telephone Encounter (Signed)
 This RN attempted to contact patient. No answer, left voicemail with callback number.        Message from Galena C sent at 05/01/2024  2:41 PM EDT  Summary: Ankle Blister and Antibiotics   Reason for Triage: Patient was seen last week for an ankle blister- Patient is taking antibiotics. She has one day left. Patient stated that symptoms are improving. It has gone down but it is not completely gone. Patient is wondering if she should get another round of antibiotics or not.  [phone] (H)

## 2024-05-01 NOTE — Telephone Encounter (Signed)
 FYI Only or Action Required?: Action required by provider: Requesting more Keflex .  Patient was last seen in primary care on 04/25/2024 by Daryl Setter, NP.  Called Nurse Triage reporting Ankle Blister.  Symptoms began several days ago.  Interventions attempted: Prescription medications: Keflex  500mg .  Symptoms are: gradually improving.  Triage Disposition: Home Care  Patient/caregiver understands and will follow disposition?: Yes  Reason for Disposition  Normal painful or large friction blister  Answer Assessment - Initial Assessment Questions Currently taking Keflex  500mg , has 1 pill left and feels she needs another round of antibiotics. Patient stated whenever she needs antibiotics for something she typically requires 2 rounds. Patient requesting another round of [facility] [address] [facility] on file. Please advise.   1. APPEARANCE of BLISTER: What does it look like?     Brown in color, seems to have a little drainage coming from it, cannot tell color  2. SIZE: How large is the blister? (e.g., inches, cm or compare to coins)     About the size of a quarter, has gotten smaller  3. LOCATION: Where are the blisters located?      Outside left ankle  4. PAIN: Does it hurt? If Yes, ask: How bad is the pain?  (Scale 0-10; or none, mild, moderate, severe)     Denies pain, accidentally hit it going to bathroom this morning and that really hurt, since resolved.  5. OTHER SYMPTOMS: Do you have any other symptoms? (e.g., fever)     Denies  Protocols used: Blister - Foot and Hand-A-AH  Message from Leah C sent at 05/01/2024  2:41 PM EDT       Summary: Ankle Blister and Antibiotics     Reason for Triage: Patient was seen last week for an ankle blister- Patient is taking antibiotics. She has one day left. Patient stated that symptoms are improving. It has gone down but it is not completely gone. Patient is wondering if she should get another  round of antibiotics or not.  [phone] (H)

## 2024-05-01 NOTE — Telephone Encounter (Signed)
 Pt seen by Melissa. Please advise.

## 2024-05-02 ENCOUNTER — Other Ambulatory Visit: Payer: Self-pay

## 2024-05-02 ENCOUNTER — Telehealth: Payer: Self-pay | Admitting: Family Medicine

## 2024-05-02 DIAGNOSIS — R110 Nausea: Secondary | ICD-10-CM

## 2024-05-02 DIAGNOSIS — S90522A Blister (nonthermal), left ankle, initial encounter: Secondary | ICD-10-CM

## 2024-05-02 MED ORDER — ONDANSETRON HCL 8 MG PO TABS: 8.0000 mg | ORAL_TABLET | Freq: Three times a day (TID) | ORAL | 0 refills | Status: DC | PRN

## 2024-05-02 MED ORDER — CEPHALEXIN 500 MG PO CAPS: 500.0000 mg | ORAL_CAPSULE | Freq: Two times a day (BID) | ORAL | 0 refills | Status: AC

## 2024-05-02 NOTE — Addendum Note (Signed)
 Addended by: Aleiyah Halpin D on: 05/02/2024 01:21 PM   Modules accepted: Orders

## 2024-05-02 NOTE — Telephone Encounter (Signed)
 Copied from CRM #[id number]. Topic: Clinical - Prescription Issue >> May 02, 2024  1:12 PM Alfonso HERO wrote: Reason for CRM: Pharmacy is telling patient they didn't receive the medication request for ondansetron  (ZOFRAN ) 8 MG tablet. Patient is asking for someone to call [facility] and confirm.

## 2024-05-02 NOTE — Telephone Encounter (Signed)
 Rx resent.

## 2024-05-02 NOTE — Telephone Encounter (Signed)
 Patient notified of this information and scheduled to come in Tuesday to see pcp (patient unable to get ride on Monday)

## 2024-05-06 ENCOUNTER — Ambulatory Visit (INDEPENDENT_AMBULATORY_CARE_PROVIDER_SITE_OTHER): Admitting: Family Medicine

## 2024-05-06 ENCOUNTER — Encounter: Payer: Self-pay | Admitting: Family Medicine

## 2024-05-06 VITALS — BP 120/70 | HR 64 | Temp 97.5°F | Resp 18 | Ht 66.0 in | Wt 250.0 lb

## 2024-05-06 DIAGNOSIS — L89620 Pressure ulcer of left heel, unstageable: Secondary | ICD-10-CM

## 2024-05-06 MED ORDER — CEFTRIAXONE SODIUM 1 G IJ SOLR
1.0000 g | Freq: Once | INTRAMUSCULAR | Status: AC
  Administered 2024-05-06: 1 g via INTRAMUSCULAR

## 2024-05-06 MED ORDER — DOXYCYCLINE HYCLATE 100 MG PO TABS: 100.0000 mg | ORAL_TABLET | Freq: Two times a day (BID) | ORAL | 0 refills | Status: DC

## 2024-05-06 NOTE — Progress Notes (Signed)
 Subjective:    Patient ID: Heidi Stark, female    DOB: Nov 10, 1940, 83 y.o.   MRN: [id number]  Chief Complaint  Patient presents with   Foot Pain    left    HPI Patient is in today for L foot pan.  Discussed the use of AI scribe software for clinical note transcription with the patient, who gave verbal consent to proceed.  History of Present Illness Heidi Stark is an 83 year old female with a history of pressure ulcers who presents with a draining blister on her foot.  She noticed a large, hot blister on the outside of her foot upon waking up at 8:00 AM on April 25, 2024. The blister continues to drain without drying up. She contacted the medical office at 8:20 AM.  She has a history of similar wounds on her legs, previously treated with a Rocephin  injection and a two to three-week course of antibiotics. She has completed two courses of Keflex , with the second course being shorter than the first, yet the blister continues to drain.  She has poor circulation in her legs. In the past, she experienced a similar issue with her elbow, which required three or four IV doses of Rocephin  and took a week and a half to resolve. She also has a history of bursitis and cellulitis.  Her current medications include Keflex , which she is finishing, with two pills remaining from her second course. She also has a pacemaker and a history of congestive heart failure. A lead from her pacemaker came loose and twisted into her heart, but due to her age and heart condition, it was decided not to perform open-heart surgery. Instead, another lead was placed beside the existing one.  She experiences significant pain in her foot and leg, which sometimes eases.    Past Medical History:  Diagnosis Date   Arthritis    hands, wrists (11/07/2016)   CHF (congestive heart failure) (HCC)    Chronic lower back pain    Constipation    DVT (deep venous thrombosis) (HCC)    History of blood transfusion 1990    related to OR   History of kidney stones    Hyperlipidemia    Hypertension    Joint pain    Lower extremity edema    OSA on CPAP    Persistent atrial fibrillation (HCC)    Pneumonia    several times (11/07/2016)   Type II diabetes mellitus (HCC)     Past Surgical History:  Procedure Laterality Date   ATRIAL FIBRILLATION ABLATION N/A 11/07/2016   Procedure: Atrial Fibrillation Ablation;  Surgeon: Will Gladis Norton, MD;  Location: [facility] INVASIVE CV LAB;  Service: Cardiovascular;  Laterality: N/A;   BACK SURGERY     CARDIAC CATHETERIZATION     CARDIOVERSION N/A 08/11/2016   Procedure: CARDIOVERSION;  Surgeon: Ezra GORMAN Shuck, MD;  Location: [facility] ENDOSCOPY;  Service: Cardiovascular;  Laterality: N/A;   CARDIOVERSION N/A 12/03/2017   Procedure: CARDIOVERSION;  Surgeon: Shuck Ezra GORMAN, MD;  Location: [facility] ENDOSCOPY;  Service: Cardiovascular;  Laterality: N/A;   CARDIOVERSION N/A 05/02/2018   Procedure: CARDIOVERSION;  Surgeon: Mona Vinie BROCKS, MD;  Location: [facility] ENDOSCOPY;  Service: Cardiovascular;  Laterality: N/A;   CARDIOVERSION N/A 06/23/2020   Procedure: CARDIOVERSION;  Surgeon: Hobart Powell BRAVO, MD;  Location: [facility] ENDOSCOPY;  Service: Cardiovascular;  Laterality: N/A;   CARDIOVERSION N/A 10/11/2020   Procedure: CARDIOVERSION;  Surgeon: Shuck Ezra GORMAN, MD;  Location: [facility] ENDOSCOPY;  Service:  Cardiovascular;  Laterality: N/A;   CARPAL TUNNEL RELEASE     CATARACT EXTRACTION W/ INTRAOCULAR LENS  IMPLANT, BILATERAL Bilateral    COLON SURGERY  1990   vein graft and colon repair after nicked artery with back surgert   COLONOSCOPY     CORONARY ANGIOGRAPHY N/A 09/08/2020   Procedure: CORONARY ANGIOGRAPHY;  Surgeon: Rolan Ezra RAMAN, MD;  Location: [facility] INVASIVE CV LAB;  Service: Cardiovascular;  Laterality: N/A;   CYSTECTOMY     between bladder and kidneys   GANGLION CYST EXCISION Left    ICD IMPLANT N/A 09/15/2020   Procedure: ICD IMPLANT;  Surgeon: Inocencio Soyla Lunger, MD;   Location: [facility] INVASIVE CV LAB;  Service: Cardiovascular;  Laterality: N/A;   KNEE CARTILAGE SURGERY Right 1960s   LUMBAR DISC SURGERY  1990   REPAIR ILIAC ARTERY  1990   vein graft and colon repair after nicked artery with back surgert   TEE WITHOUT CARDIOVERSION N/A 10/31/2013   Procedure: TRANSESOPHAGEAL ECHOCARDIOGRAM (TEE);  Surgeon: Ezra RAMAN Rolan, MD;  Location: [facility] ENDOSCOPY;  Service: Cardiovascular;  Laterality: N/A;   TUBAL LIGATION      Family History  Problem Relation Age of Onset   Diabetes Mother    Hypertension Mother    Cancer Mother    Obesity Mother    Diabetes Father    Obesity Father    Diabetes Other    Melanoma Other    Factor V Leiden deficiency Daughter     Social History   Socioeconomic History   Marital status: Married    Spouse name: [name]   Number of children: 2   Years of education: Not on file   Highest education level: Not on file  Occupational History   Occupation: Retired  Tobacco Use   Smoking status: Former    Current packs/day: 0.00    Average packs/day: 1 pack/day for 50.0 years (50.0 ttl pk-yrs)    Types: Cigarettes    Start date: 08/17/1970    Quit date: 08/17/2020    Years since quitting: 3.7   Smokeless tobacco: Never   Tobacco comments:    Quit 2022. Tay  Vaping Use   Vaping status: Never Used  Substance and Sexual Activity   Alcohol use: No    Alcohol/week: 0.0 standard drinks of alcohol   Drug use: No   Sexual activity: Not on file  Other Topics Concern   Not on file  Social History Narrative   Married x 67 years.    1 son and 1 daughter.   1 grandson.   Social Drivers of Health   Financial Resource Strain: Medium Risk (10/13/2021)   Overall Financial Resource Strain (CARDIA)    Difficulty of Paying Living Expenses: Somewhat hard  Food Insecurity: No Food Insecurity (12/17/2023)   Hunger Vital Sign    Worried About Running Out of Food in the Last Year: Never true    Ran Out of Food in the Last Year: Never true   Transportation Needs: No Transportation Needs (12/17/2023)   PRAPARE - Administrator, Civil Service (Medical): No    Lack of Transportation (Non-Medical): No  Physical Activity: Insufficiently Active (10/13/2021)   Exercise Vital Sign    Days of Exercise per Week: 2 days    Minutes of Exercise per Session: 20 min  Stress: No Stress Concern Present (10/13/2021)   [employer] of Occupational Health - Occupational Stress Questionnaire    Feeling of Stress : Not at all  Social Connections:  Moderately Integrated (12/17/2023)   Social Connection and Isolation Panel    Frequency of Communication with Friends and Family: More than three times a week    Frequency of Social Gatherings with Friends and Family: More than three times a week    Attends Religious Services: 1 to 4 times per year    Active Member of [employer] or Organizations: No    Attends Banker Meetings: 1 to 4 times per year    Marital Status: Widowed  Intimate Partner Violence: Not At Risk (12/17/2023)   Humiliation, Afraid, Rape, and Kick questionnaire    Fear of Current or Ex-Partner: No    Emotionally Abused: No    Physically Abused: No    Sexually Abused: No    Outpatient Medications Prior to Visit  Medication Sig Dispense Refill   acetaminophen  (TYLENOL ) 500 MG tablet Take 500 mg by mouth every 6 (six) hours as needed for moderate pain (pain score 4-6) or headache.     amiodarone  (PACERONE ) 200 MG tablet TAKE 1 TABLET BY MOUTH EVERY DAY 90 tablet 3   apixaban  (ELIQUIS ) 5 MG TABS tablet TAKE 1 TABLET BY MOUTH TWICE A DAY 180 tablet 1   BD PEN NEEDLE NANO 2ND GEN 32G X 4 MM MISC See admin instructions.     dapagliflozin  propanediol (FARXIGA ) 10 MG TABS tablet TAKE 1 TABLET BY MOUTH EVERY DAY BEFORE BREAKFAST 90 tablet 3   ENTRESTO  24-26 MG TAKE 1 TABLET BY MOUTH TWICE A DAY 180 tablet 8   eplerenone  (INSPRA ) 25 MG tablet Take 1 tablet (25 mg total) by mouth daily. 30 tablet 11   famotidine  (PEPCID ) 20  MG tablet Take 1 tablet (20 mg total) by mouth daily. 90 tablet 0   fenofibrate  (TRICOR ) 145 MG tablet TAKE 1 TABLET BY MOUTH EVERY DAY 90 tablet 3   furosemide  (LASIX ) 40 MG tablet Take 40 mg by mouth 2 (two) times daily. Take 2 tablets by mouth twice daily     gabapentin  (NEURONTIN ) 300 MG capsule Take 300 mg by mouth 2 (two) times daily as needed (pain).     HUMALOG  MIX 75/25 KWIKPEN (75-25) 100 UNIT/ML KwikPen Take 10 units with morning meal and 10 units with evening meal     levothyroxine  (SYNTHROID ) 50 MCG tablet Take 1 tablet (50 mcg total) by mouth daily. 90 tablet 3   LIVALO  4 MG TABS TAKE 1 TABLET BY MOUTH EVERY DAY 90 tablet 1   LORazepam  (ATIVAN ) 0.5 MG tablet Take 1 tablet (0.5 mg total) by mouth at bedtime. 30 tablet 1   metoprolol  succinate (TOPROL -XL) 25 MG 24 hr tablet TAKE 1 TABLET BY MOUTH EVERYDAY AT BEDTIME 90 tablet 3   mexiletine (MEXITIL ) 150 MG capsule TAKE 2 CAPSULES (300 MG TOTAL) BY MOUTH 2 (TWO) TIMES DAILY. 360 capsule 3   MOUNJARO 2.5 MG/0.5ML Pen Inject 2.5 mg into the skin once a week. Thursdays     Multiple Vitamin (MULTIVITAMIN WITH MINERALS) TABS tablet Take 1 tablet by mouth daily.     ondansetron  (ZOFRAN ) 8 MG tablet Take 1 tablet (8 mg total) by mouth every 8 (eight) hours as needed for nausea or vomiting. 30 tablet 0   ONETOUCH VERIO test strip 1 each 3 (three) times daily.     potassium chloride  (MICRO-K ) 10 MEQ CR capsule Take 1 capsule (10 mEq total) by mouth 2 (two) times daily. 60 capsule 3   sertraline  (ZOLOFT ) 25 MG tablet Take 1 tablet (25 mg total) by mouth  daily. 90 tablet 2   VASCEPA  1 g capsule TAKE 2 CAPSULES BY MOUTH TWICE A DAY 120 capsule 5   No facility-administered medications prior to visit.    Allergies  Allergen Reactions   Neomycin-Bacitracin Zn-Polymyx Rash   Sulfa Antibiotics Diarrhea and Nausea And Vomiting   Ciprofloxacin  Hives, Itching and Rash    Pt doesn't remember having any reaction to cipro    Spironolactone  Rash     Review of Systems  Constitutional:  Negative for fever and malaise/fatigue.  HENT:  Negative for congestion.   Eyes:  Negative for blurred vision.  Respiratory:  Negative for shortness of breath.   Cardiovascular:  Negative for chest pain, palpitations and leg swelling.  Gastrointestinal:  Negative for abdominal pain, blood in stool and nausea.  Genitourinary:  Negative for dysuria and frequency.  Musculoskeletal:  Positive for joint pain. Negative for falls.  Skin:  Negative for rash.  Neurological:  Negative for dizziness, loss of consciousness and headaches.  Endo/Heme/Allergies:  Negative for environmental allergies.  Psychiatric/Behavioral:  Negative for depression. The patient is not nervous/anxious.        Objective:    Physical Exam Vitals and nursing note reviewed.  Constitutional:      General: She is not in acute distress.    Appearance: Normal appearance. She is well-developed.  HENT:     Head: Normocephalic and atraumatic.  Eyes:     General: No scleral icterus.       Right eye: No discharge.        Left eye: No discharge.  Cardiovascular:     Rate and Rhythm: Normal rate and regular rhythm.     Heart sounds: No murmur heard. Pulmonary:     Effort: Pulmonary effort is normal. No respiratory distress.     Breath sounds: Normal breath sounds.  Musculoskeletal:        General: Normal range of motion.     Cervical back: Normal range of motion and neck supple.     Right lower leg: No edema.     Left lower leg: No edema.     Left foot: Tenderness present.       Legs:  Skin:    General: Skin is warm and dry.  Neurological:     Mental Status: She is alert and oriented to person, place, and time.  Psychiatric:        Mood and Affect: Mood normal.        Behavior: Behavior normal.        Thought Content: Thought content normal.        Judgment: Judgment normal.     BP 120/70   Pulse 64   Temp (!) 97.5 F (36.4 C)   Resp 18   Ht 5' 6 (1.676 m)    Wt 250 lb (113.4 kg)   SpO2 98%   BMI 40.35 kg/m  Wt Readings from Last 3 Encounters:  05/06/24 250 lb (113.4 kg)  04/21/24 252 lb (114.3 kg)  03/28/24 250 lb (113.4 kg)    Diabetic Foot Exam - Simple   No data filed    Lab Results  Component Value Date   WBC 7.4 04/21/2024   HGB 15.9 (H) 04/21/2024   HCT 50.6 (H) 04/21/2024   PLT 152 04/21/2024   GLUCOSE 103 (H) 04/21/2024   CHOL 152 09/14/2023   TRIG 307 (H) 09/14/2023   HDL 32 (L) 09/14/2023   LDLDIRECT 42.0 11/30/2014   LDLCALC 83 09/14/2023   ALT 14  04/21/2024   AST 35 04/21/2024   NA 139 04/21/2024   K 3.5 04/21/2024   CL 99 04/21/2024   CREATININE 1.15 (H) 04/21/2024   BUN 14 04/21/2024   CO2 27 04/21/2024   TSH 12.946 (H) 04/21/2024   INR 1.6 (H) 12/17/2023   HGBA1C 6.2 01/30/2024   MICROALBUR 12.8 10/26/2020    Lab Results  Component Value Date   TSH 12.946 (H) 04/21/2024   Lab Results  Component Value Date   WBC 7.4 04/21/2024   HGB 15.9 (H) 04/21/2024   HCT 50.6 (H) 04/21/2024   MCV 95.5 04/21/2024   PLT 152 04/21/2024   Lab Results  Component Value Date   NA 139 04/21/2024   K 3.5 04/21/2024   CO2 27 04/21/2024   GLUCOSE 103 (H) 04/21/2024   BUN 14 04/21/2024   CREATININE 1.15 (H) 04/21/2024   BILITOT 1.0 04/21/2024   ALKPHOS 52 04/21/2024   AST 35 04/21/2024   ALT 14 04/21/2024   PROT 7.2 04/21/2024   ALBUMIN 3.4 (L) 04/21/2024   CALCIUM  8.1 (L) 04/21/2024   ANIONGAP 13 04/21/2024   EGFR 32 (L) 02/03/2022   GFR 37.04 (L) 01/30/2024   Lab Results  Component Value Date   CHOL 152 09/14/2023   Lab Results  Component Value Date   HDL 32 (L) 09/14/2023   Lab Results  Component Value Date   LDLCALC 83 09/14/2023   Lab Results  Component Value Date   TRIG 307 (H) 09/14/2023   Lab Results  Component Value Date   CHOLHDL 4.8 09/14/2023   Lab Results  Component Value Date   HGBA1C 6.2 01/30/2024       Assessment & Plan:  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  Dispense: 20 tablet; Refill: 0 -     Ambulatory referral to Home Health -     cefTRIAXone  Sodium  Assessment and Plan Assessment & Plan Chronic lower extremity wound with drainage in the setting of peripheral vascular disease   The chronic wound on her lower extremity has persistent drainage, likely due to peripheral vascular disease, and has not healed as expected despite antibiotics. There is concern for potential underlying osteomyelitis. The wound is prone to infection due to poor circulation and pressure sores. Order a wound culture and administer a Rocephin  injection. Prescribe doxycycline  and refer to home health for wound care management. Instruct her to keep the wound covered with gauze or a large Band-Aid. Order an x-ray of the foot to rule out osteomyelitis and advise her to elevate her legs to improve circulation.  Pacemaker lead displacement   The pacemaker lead is displaced and twisted into the heart. Due to her age and congestive heart failure, the risk of open-heart surgery is high.    Zahmir Lalla R Lowne Chase, DO

## 2024-05-07 ENCOUNTER — Ambulatory Visit: Payer: Self-pay

## 2024-05-07 ENCOUNTER — Ambulatory Visit (HOSPITAL_BASED_OUTPATIENT_CLINIC_OR_DEPARTMENT_OTHER)
Admission: RE | Admit: 2024-05-07 | Discharge: 2024-05-07 | Disposition: A | Discharge status: Home / Self Care | Source: Ambulatory Visit | Attending: Family Medicine | Admitting: Family Medicine

## 2024-05-07 DIAGNOSIS — L89620 Pressure ulcer of left heel, unstageable: Secondary | ICD-10-CM | POA: Diagnosis not present

## 2024-05-07 DIAGNOSIS — M7732 Calcaneal spur, left foot: Secondary | ICD-10-CM | POA: Diagnosis not present

## 2024-05-07 DIAGNOSIS — M19072 Primary osteoarthritis, left ankle and foot: Secondary | ICD-10-CM | POA: Diagnosis not present

## 2024-05-07 DIAGNOSIS — M79672 Pain in left foot: Secondary | ICD-10-CM | POA: Diagnosis not present

## 2024-05-07 DIAGNOSIS — L89899 Pressure ulcer of other site, unspecified stage: Secondary | ICD-10-CM | POA: Diagnosis not present

## 2024-05-07 NOTE — Telephone Encounter (Signed)
 FYI Only or Action Required?: Action required by provider: clinical question for provider and update on patient condition.  Patient was last seen in primary care on 05/06/2024 by Antonio Meth, Jamee SAUNDERS, DO.  Called Nurse Triage reporting Wound Infection.  Symptoms began several weeks ago.  Symptoms are: gradually worsening.  Triage Disposition: Home Care  Patient/caregiver understands and will follow disposition?: Yes      Copied from CRM [id number]. Topic: Clinical - Red Word Triage >> May 07, 2024  3:53 PM Joesph NOVAK wrote: Red Word that prompted transfer to Nurse Triage: Pressure wound on ankle, redness on ankle,painful. Yellow drainage. patients daughter feels like she needs another injection,       Reason for Disposition  [1] Taking antibiotic < 72 hours (3 days) AND [2] infected wound doesn't look better  Answer Assessment - Initial Assessment Questions Patient's daughter called stating that the patinet's wound seems worse since the Rocephin  injection yesterday. I advised that antibiotics can take 2-3 days before there is an improvement. She states she would monitor the patient's symptoms and call back tomorrow if they continue to worsen.She would like to know if the doctor would like the patient to come in for another injection. Please advise.     1. LOCATION: Where is the wound located?      Left ankle  2. WOUND APPEARANCE: What does the wound look like?      Yellow drainage and redness  3. SIZE: If redness is present, ask: What is the size of the red area? (Inches, centimeters, or compare to size of a coin)      Wound is about a 50 cent piece and there is some redness around it 4. SPREAD: What's changed in the last day?  Do you see any red streaks coming from the wound?     Redness has spread since yesterday 5. ONSET: When did it start to look infected?      A couple of weeks ago  6. MECHANISM: How did the wound start, what was the cause?     Pressure  wound  7. PAIN: Is there any pain? If Yes, ask: How bad is the pain?   (Scale 1-10; or mild, moderate, severe)     Moderate  8. FEVER: Do you have a fever? If Yes, ask: What is your temperature, how was it measured, and when did it start?     No 9. OTHER SYMPTOMS: Do you have any other symptoms? (e.g., shaking chills, weakness, rash elsewhere on body)     No  Protocols used: Wound Infection-A-AH

## 2024-05-08 ENCOUNTER — Encounter: Payer: Self-pay | Admitting: Family Medicine

## 2024-05-09 ENCOUNTER — Ambulatory Visit: Payer: Self-pay

## 2024-05-09 LAB — WOUND CULTURE
MICRO NUMBER:: 17127393
SPECIMEN QUALITY:: ADEQUATE

## 2024-05-09 NOTE — Telephone Encounter (Signed)
 This RN spoke with the patient's daughter regarding requests. Requesting a follow up call regarding home health orders and status of xray results. This RN told her xray results are still pending and culture results say  Moderate growth of Methicillin resistant Staphylococcus aureus (MRSA) . Patient's daughter requesting medication to treat infection and advice on treatment and next steps. Preferred pharmacy below.   [id number] [address], [place] - [address]  [address], [place] [address]  Protocols used: Medication Question Call-A-AH

## 2024-05-09 NOTE — Telephone Encounter (Signed)
 FYI Only or Action Required?: Action required by provider: Update on medications, results, and orders for home health.  Patient was last seen in primary care on 05/06/2024 by Antonio Meth, Jamee SAUNDERS, DO.  Called Nurse Triage reporting Results.  Symptoms began several days ago.  Interventions attempted: Nothing.  Symptoms are: unchanged.  Triage Disposition: Call PCP Now  Patient/caregiver understands and will follow disposition?: Yes      Copied from CRM [id number]. Topic: Clinical - Red Word Triage >> May 09, 2024 11:08 AM Suzen RAMAN wrote: Red Word that prompted transfer to Nurse Triage: Leg Pain due to wound. Patient daughter on the line initial inquiring about culture/x-ray results and home health order status. Culture has results but no provider notations and x-ray has not been released or reviewed. Home health order still pending review. Can send additional message to clinic regarding Reason for Disposition  [1] Caller has URGENT medicine question about med that primary care doctor (or NP/PA) or specialist prescribed AND [2] triager unable to answer question  Answer Assessment - Initial Assessment Questions 1. NAME of MEDICINE: What medicine(s) are you calling about?     Rocephin   2. QUESTION: What is your question? (e.g., double dose of medicine, side effect)     Is the antibiotic given in office going to treat a MRSA infection?  3. PRESCRIBER: Who prescribed the medicine? Reason: if prescribed by specialist, call should be referred to that group.     PCP 4. SYMPTOMS: Do you have any symptoms? If Yes, ask: What symptoms are you having?  How bad are the symptoms (e.g., mild, moderate, severe)     Leg pain due to her wound    This RN spoke with the patient's daughter regarding requests. Requesting a follow up call regarding home health orders and status of xray results. This RN told her xray results are still pending and culture results say  Moderate growth of  Methicillin resistant Staphylococcus aureus (MRSA) . Patient's daughter requesting medication to treat infection and advice on treatment and next steps. Preferred pharmacy below.   [id number] [address], [place] - [address]  [address], [place] [address]  Protocols used: Medication Question Call-A-AH

## 2024-05-10 ENCOUNTER — Ambulatory Visit: Payer: Self-pay | Admitting: Family

## 2024-05-11 ENCOUNTER — Other Ambulatory Visit (HOSPITAL_COMMUNITY): Payer: Self-pay | Admitting: Internal Medicine

## 2024-05-12 NOTE — Telephone Encounter (Signed)
 Seen by patient Heidi Stark on 05/11/2024  9:26 PM

## 2024-05-13 ENCOUNTER — Telehealth: Payer: Self-pay | Admitting: Neurology

## 2024-05-13 NOTE — Telephone Encounter (Signed)
 Copied from CRM [id number]. Topic: Clinical - Home Health Verbal Orders >> May 13, 2024  3:25 PM Suzen RAMAN wrote: Caller/Agency: Lottie Corbin-RN/[facility] Callback Number: [phone] Service Requested: Wound care- left ankle & heel- stage 2 pressure sore Frequency: 2x week for 2 1x week for 3 weeks..  Any new concerns about the patient? No

## 2024-05-14 ENCOUNTER — Other Ambulatory Visit: Payer: Self-pay

## 2024-05-14 DIAGNOSIS — Z01812 Encounter for preprocedural laboratory examination: Secondary | ICD-10-CM

## 2024-05-14 DIAGNOSIS — I472 Ventricular tachycardia, unspecified: Secondary | ICD-10-CM

## 2024-05-14 DIAGNOSIS — T82110D Breakdown (mechanical) of cardiac electrode, subsequent encounter: Secondary | ICD-10-CM

## 2024-05-14 NOTE — Telephone Encounter (Signed)
 Verbal given

## 2024-05-19 ENCOUNTER — Telehealth (HOSPITAL_COMMUNITY): Payer: Self-pay

## 2024-05-19 NOTE — Telephone Encounter (Signed)
 Attempted to reach patient to discuss upcoming procedure, no answer. Left VM for patient to return call.

## 2024-05-19 NOTE — Telephone Encounter (Signed)
 Patient returned call to discuss upcoming Lead Revision/Repair on 11/20.   Noted patient has a draining left ankle wound that has been treated twice with Keflex , Rocephin  injection (10/21), Doxycycline  (completed 10/31), xray negt for osteomyelitis, wound cx positive for MRSA, and HH referral for wound care.  She is unaware of wound healing progression because she does not unwrap bandage. States the [facility] is supposed to come out today for her second visit.   Dr. Inocencio- Does this impact patient proceeding with procedure as scheduled?

## 2024-05-19 NOTE — Telephone Encounter (Signed)
 I got her scheduled for a virtual visit

## 2024-05-20 ENCOUNTER — Encounter (HOSPITAL_COMMUNITY): Payer: Self-pay | Admitting: *Deleted

## 2024-05-20 ENCOUNTER — Emergency Department (HOSPITAL_COMMUNITY)
Admission: EM | Admit: 2024-05-20 | Discharge: 2024-05-21 | Disposition: A | Discharge status: Home / Self Care | Source: Home / Self Care | Attending: Emergency Medicine | Admitting: Emergency Medicine

## 2024-05-20 ENCOUNTER — Other Ambulatory Visit: Payer: Self-pay

## 2024-05-20 DIAGNOSIS — R778 Other specified abnormalities of plasma proteins: Secondary | ICD-10-CM | POA: Insufficient documentation

## 2024-05-20 DIAGNOSIS — Z7984 Long term (current) use of oral hypoglycemic drugs: Secondary | ICD-10-CM | POA: Insufficient documentation

## 2024-05-20 DIAGNOSIS — I4891 Unspecified atrial fibrillation: Secondary | ICD-10-CM | POA: Insufficient documentation

## 2024-05-20 DIAGNOSIS — R7401 Elevation of levels of liver transaminase levels: Secondary | ICD-10-CM | POA: Insufficient documentation

## 2024-05-20 DIAGNOSIS — Z79899 Other long term (current) drug therapy: Secondary | ICD-10-CM | POA: Insufficient documentation

## 2024-05-20 DIAGNOSIS — R110 Nausea: Secondary | ICD-10-CM | POA: Insufficient documentation

## 2024-05-20 DIAGNOSIS — N189 Chronic kidney disease, unspecified: Secondary | ICD-10-CM | POA: Insufficient documentation

## 2024-05-20 DIAGNOSIS — N170 Acute kidney failure with tubular necrosis: Secondary | ICD-10-CM | POA: Diagnosis not present

## 2024-05-20 DIAGNOSIS — Z7901 Long term (current) use of anticoagulants: Secondary | ICD-10-CM | POA: Insufficient documentation

## 2024-05-20 DIAGNOSIS — B372 Candidiasis of skin and nail: Secondary | ICD-10-CM | POA: Insufficient documentation

## 2024-05-20 DIAGNOSIS — I509 Heart failure, unspecified: Secondary | ICD-10-CM | POA: Insufficient documentation

## 2024-05-20 DIAGNOSIS — Z794 Long term (current) use of insulin: Secondary | ICD-10-CM | POA: Insufficient documentation

## 2024-05-20 DIAGNOSIS — R7989 Other specified abnormal findings of blood chemistry: Secondary | ICD-10-CM

## 2024-05-20 DIAGNOSIS — J449 Chronic obstructive pulmonary disease, unspecified: Secondary | ICD-10-CM | POA: Insufficient documentation

## 2024-05-20 DIAGNOSIS — I130 Hypertensive heart and chronic kidney disease with heart failure and stage 1 through stage 4 chronic kidney disease, or unspecified chronic kidney disease: Secondary | ICD-10-CM | POA: Insufficient documentation

## 2024-05-20 DIAGNOSIS — E119 Type 2 diabetes mellitus without complications: Secondary | ICD-10-CM | POA: Insufficient documentation

## 2024-05-20 DIAGNOSIS — E860 Dehydration: Secondary | ICD-10-CM | POA: Diagnosis not present

## 2024-05-20 LAB — CBC
HCT: 55.5 % — ABNORMAL HIGH (ref 36.0–46.0)
Hemoglobin: 17.9 g/dL — ABNORMAL HIGH (ref 12.0–15.0)
MCH: 28.9 pg (ref 26.0–34.0)
MCHC: 32.3 g/dL (ref 30.0–36.0)
MCV: 89.5 fL (ref 80.0–100.0)
Platelets: 116 K/uL — ABNORMAL LOW (ref 150–400)
RBC: 6.2 MIL/uL — ABNORMAL HIGH (ref 3.87–5.11)
RDW: 16.5 % — ABNORMAL HIGH (ref 11.5–15.5)
WBC: 7.3 K/uL (ref 4.0–10.5)
nRBC: 0 % (ref 0.0–0.2)

## 2024-05-20 MED ORDER — SODIUM CHLORIDE 0.9 % IV BOLUS: 1000.0000 mL | Freq: Once | INTRAVENOUS | Status: DC

## 2024-05-20 MED ORDER — ONDANSETRON HCL 4 MG/2ML IJ SOLN
4.0000 mg | Freq: Once | INTRAMUSCULAR | Status: AC | PRN
  Administered 2024-05-20: 4 mg via INTRAVENOUS
  Filled 2024-05-20: qty 2

## 2024-05-20 MED ORDER — ONDANSETRON HCL 4 MG/2ML IJ SOLN
4.0000 mg | Freq: Once | INTRAMUSCULAR | Status: AC
  Administered 2024-05-20: 4 mg via INTRAVENOUS
  Filled 2024-05-20: qty 2

## 2024-05-20 MED ORDER — SODIUM CHLORIDE 0.9 % IV BOLUS
500.0000 mL | Freq: Once | INTRAVENOUS | Status: AC
  Administered 2024-05-20: 500 mL via INTRAVENOUS

## 2024-05-20 NOTE — ED Provider Triage Note (Signed)
 Emergency Medicine Provider Triage Evaluation Note  Heidi Stark , a 83 y.o. female  was evaluated in triage.  Pt complains of NV started friday. Monjaro shot on Friday. Same dose, has had them for past year with no reaction. No abd pain nor chest pain. No shob  Review of Systems  Positive: See hpi Negative:   Physical Exam  BP 129/80 (BP Location: Right Arm)   Pulse (!) 58   Temp 97.9 F (36.6 C)   Resp 17   SpO2 96%  Gen:   Awake, no distress   Resp:  Normal effort  MSK:   Moves extremities without difficulty  Other:  No abdominal tenderness  Medical Decision Making  Medically screening exam initiated at 7:38 PM.  Appropriate orders placed.  Heidi Stark was informed that the remainder of the evaluation will be completed by another provider, this initial triage assessment does not replace that evaluation, and the importance of remaining in the ED until their evaluation is complete.  Labs, EKG ordered   Heidi Stark, Heidi Stark 05/20/24 1940

## 2024-05-20 NOTE — ED Triage Notes (Signed)
 Pt arrived by [facility] for increased nausea since receiving Monjaro injection on Friday, no vomiting, decreased PO intake. EMS VS 97/palpated, pulse 56, spO2 97%  20g L arm, given 500ml NS enroute

## 2024-05-20 NOTE — ED Notes (Signed)
 Redrew light green top for lab.

## 2024-05-20 NOTE — ED Notes (Signed)
 Called lab to add on troponin

## 2024-05-20 NOTE — ED Provider Notes (Signed)
 [place] [facility]ERGENCY DEPARTMENT AT [facility] Provider Note   CSN: [id number] Arrival date & time: 05/20/24  1900     Patient presents with: Emesis   Heidi Stark is a 83 y.o. female.   83 year old female presents from home with family with concern for extreme nausea without vomiting or abdominal pain, onset on Friday (4 days ago) after her Monjaro injection. States she is unable to eat or drink anything due to her nausea. No recent dose changes on her Monjaro. Denies chest pain, fevers, chills, changes in bowel or bladder habits. 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 Authorizing Provider  acetaminophen  (TYLENOL ) 500 MG tablet Take 500 mg by mouth every 6 (six) hours as needed for moderate pain (pain score 4-6) or headache.   Yes [provider]  amiodarone  (PACERONE ) 200 MG tablet TAKE 1 TABLET BY MOUTH EVERY DAY 08/23/23  Yes Rolan Ezra RAMAN, MD  apixaban  (ELIQUIS ) 5 MG TABS tablet TAKE 1 TABLET BY MOUTH TWICE A DAY 03/06/24  Yes Rolan Ezra RAMAN, MD  BD PEN NEEDLE NANO 2ND GEN 32G X 4 MM MISC See admin instructions. 09/24/21  Yes [provider]  clotrimazole (LOTRIMIN) 1 % cream Apply to affected area 2 times daily 05/21/24  Yes Beverley Doffing A, PA-C  dapagliflozin  propanediol (FARXIGA ) 10 MG TABS tablet TAKE 1 TABLET BY MOUTH EVERY DAY BEFORE BREAKFAST 04/23/24  Yes Rolan Ezra RAMAN, MD  ENTRESTO  24-26 MG TAKE 1 TABLET BY MOUTH TWICE A DAY 04/01/24  Yes Rolan Ezra RAMAN, MD  eplerenone  (INSPRA ) 25 MG tablet Take 1 tablet (25 mg total) by mouth daily. 03/20/24  Yes Rolan Ezra RAMAN, MD  famotidine  (PEPCID ) 20 MG tablet Take 1 tablet (20 mg total) by mouth daily. 03/10/24  Yes Antonio Cyndee Rockers R, DO  fenofibrate  (TRICOR ) 145 MG tablet TAKE 1 TABLET BY MOUTH EVERY DAY 01/22/23  Yes Rolan Ezra RAMAN, MD  furosemide   (LASIX ) 40 MG tablet Take 40 mg by mouth 2 (two) times daily. Take 2 tablets by mouth twice daily 11/15/23  Yes [provider]  gabapentin  (NEURONTIN ) 300 MG capsule Take 300 mg by mouth 2 (two) times daily as needed (pain). 07/31/22  Yes [provider]  HUMALOG  MIX 75/25 KWIKPEN (75-25) 100 UNIT/ML KwikPen Take 10 units with morning meal and 10 units with evening meal Patient taking differently: Inject 0-30 Units into the skin daily. If BS is  100 or < = No Insulin  100 - 140 = 10 Units Over 140 = 30 Units in the Morning and 20 Units at Bedtime 12/27/23  Yes Rojelio Nest, DO  levothyroxine  (SYNTHROID ) 25 MCG tablet Take 25 mcg by mouth as directed. 1 Tablet(s) By Mouth 4 Times a Week 04/06/24  Yes [provider]  LIVALO  4 MG TABS TAKE 1 TABLET BY MOUTH EVERY DAY 03/06/24  Yes Rolan Ezra RAMAN, MD  LORazepam  (ATIVAN ) 0.5 MG tablet Take 1 tablet (0.5 mg total) by mouth at bedtime. 03/10/24  Yes Antonio Cyndee Rockers R, DO  metoprolol  succinate (TOPROL -XL) 25 MG 24 hr tablet TAKE 1 TABLET BY MOUTH EVERYDAY AT BEDTIME Patient taking differently: Take 25 mg by mouth daily. 04/23/23  Yes Hayes Beckey CROME, NP  mexiletine (MEXITIL ) 150 MG capsule TAKE 2 CAPSULES (300 MG TOTAL) BY MOUTH 2 (TWO)  TIMES DAILY. 05/10/23  Yes Camnitz, Will Gladis, MD  MOUNJARO 2.5 MG/0.5ML Pen Inject 2.5 mg into the skin once a week. Thursdays   Yes [provider]  Multiple Vitamin (MULTIVITAMIN WITH MINERALS) TABS tablet Take 1 tablet by mouth daily.   Yes [provider]  ondansetron  (ZOFRAN ) 8 MG tablet Take 1 tablet (8 mg total) by mouth every 8 (eight) hours as needed for nausea or vomiting. 05/02/24  Yes [name], DO  ONETOUCH VERIO test strip 1 each 3 (three) times daily. 10/09/21  Yes [provider]  potassium chloride  (MICRO-K ) 10 MEQ CR capsule TAKE 1 CAPSULE BY MOUTH 2 TIMES DAILY. 05/12/24  Yes Bensimhon, Toribio JONELLE, MD  sertraline  (ZOLOFT ) 25 MG tablet Take 1  tablet (25 mg total) by mouth daily. 03/10/24  Yes Antonio Cyndee Jamee R, DO  traMADol (ULTRAM) 50 MG tablet Take 50 mg by mouth every 8 (eight) hours as needed. 02/14/24  Yes [provider]  VASCEPA  1 g capsule TAKE 2 CAPSULES BY MOUTH TWICE A DAY 10/10/23  Yes Rolan Ezra RAMAN, MD  doxycycline  (VIBRA -TABS) 100 MG tablet Take 1 tablet (100 mg total) by mouth 2 (two) times daily. Patient not taking: Reported on 05/21/2024 05/06/24   Antonio Cyndee, Jamee JONELLE, DO  levothyroxine  (SYNTHROID ) 50 MCG tablet Take 1 tablet (50 mcg total) by mouth daily. Patient not taking: Reported on 05/21/2024 12/03/23   [name], DO    Allergies: Neomycin-bacitracin zn-polymyx, Sulfa antibiotics, Ciprofloxacin , and Spironolactone     Review of Systems Negative except as per HPI Updated Vital Signs BP 117/65   Pulse 62   Temp 98 F (36.7 C)   Resp 17   SpO2 99%   Physical Exam Vitals and nursing note reviewed.  Constitutional:      General: She is not in acute distress.    Appearance: She is well-developed. She is obese. She is not diaphoretic.  HENT:     Head: Normocephalic and atraumatic.     Mouth/Throat:     Mouth: Mucous membranes are dry.  Eyes:     Conjunctiva/sclera: Conjunctivae normal.  Cardiovascular:     Rate and Rhythm: Normal rate. Rhythm irregular.     Heart sounds: Normal heart sounds.     Comments: Diminished left DP pulse, states this is her baseline following complications from lumbar surgery years ago. Pulmonary:     Effort: Pulmonary effort is normal.     Breath sounds: Normal breath sounds.  Abdominal:     Palpations: Abdomen is soft.     Tenderness: There is no abdominal tenderness.  Musculoskeletal:     Right lower leg: No edema.     Left lower leg: No edema.  Skin:    General: Skin is warm and dry.     Findings: No erythema or rash.  Neurological:     Mental Status: She is alert and oriented to person, place, and time.  Psychiatric:        Behavior:  Behavior normal.     (all labs ordered are listed, but only abnormal results are displayed) Labs Reviewed  CBC - Abnormal; Notable for the following components:      Result Value   RBC 6.20 (*)    Hemoglobin 17.9 (*)    HCT 55.5 (*)    RDW 16.5 (*)    Platelets 116 (*)    All other components within normal limits  COMPREHENSIVE METABOLIC PANEL WITH GFR - Abnormal; Notable for  the following components:   Glucose, Bld 110 (*)    BUN 35 (*)    Creatinine, Ser 1.45 (*)    Calcium  8.0 (*)    Albumin 3.2 (*)    AST 138 (*)    ALT 55 (*)    GFR, Estimated 36 (*)    All other components within normal limits  TROPONIN T, HIGH SENSITIVITY - Abnormal; Notable for the following components:   Troponin T High Sensitivity 26 (*)    All other components within normal limits  LIPASE, BLOOD  URINALYSIS, ROUTINE W REFLEX MICROSCOPIC  TROPONIN T, HIGH SENSITIVITY    EKG: EKG Interpretation Date/Time:  Tuesday May 20 2024 19:49:11 EST Ventricular Rate:  63 PR Interval:    QRS Duration:  174 QT Interval:  521 QTC Calculation: 534 R Axis:   62  Text Interpretation: Atrial fibrillation Right bundle branch block Confirmed by Griselda Norris [phone]) on 05/21/2024 12:38:18 AM  Radiology: CT ABDOMEN PELVIS WO CONTRAST Result Date: 05/21/2024 EXAM: CT ABDOMEN AND PELVIS WITHOUT CONTRAST 05/21/2024 03:29:00 AM TECHNIQUE: CT of the abdomen and pelvis was performed without the administration of intravenous contrast. Multiplanar reformatted images are provided for review. Automated exposure control, iterative reconstruction, and/or weight-based adjustment of the mA/kV was utilized to reduce the radiation dose to as low as reasonably achievable. COMPARISON: CT abdomen and pelvis 05/12/2023 and earlier. CLINICAL HISTORY: 83 year old female. Abdominal pain, acute, nonlocalized. FINDINGS: LOWER CHEST: Stable cardiomegaly. Partially visible cardiac pacemaker lead. Calcified aortic atherosclerosis. No  pericardial or pleural effusion. Chronic elevation of the left hemidiaphragm. Lung base ventilation improved from last year with some chronic lung base scarring. LIVER: Chronic hepatomegaly, liver length 25 cm. GALLBLADDER AND BILE DUCTS: Similar gallbladder distention to the CT last year. Chronic 6 mm gallstone. No regional inflammation. No bile duct dilatation. SPLEEN: Diminutive spleen. PANCREAS: No acute abnormality. ADRENAL GLANDS: Chronic asymmetric adrenal thickening greater on the left, suggesting adrenal hyperplasia. KIDNEYS, URETERS AND BLADDER: Non-contrast kidneys appear stable and nonobstructed. Urinary bladder distended similar to the CT last year. Vascular calcifications in the pelvis. No urinary calculus identified. No stones in the kidneys or ureters. No hydronephrosis. No perinephric or periureteral stranding. GI AND BOWEL: Redundant gas containing sigmoid colon in the left abdomen, but decompressed upstream and downstream large bowel. Chronic right periumbilical ventral abdominal hernia contains a short segment of the transverse colon as before on series 2 image 48; no evidence of acute incarceration. Upstream right colon decompressed with some diverticulosis. Normal gas containing appendix on series 2 image 57. Nondilated small bowel and stomach. No free air or free fluid. PERITONEUM AND RETROPERITONEUM: No ascites. No free air. VASCULATURE: Aorta is normal in caliber. Vascular calcifications in the pelvis. LYMPH NODES: No lymphadenopathy. REPRODUCTIVE ORGANS: Negative non-contrast uterus and adnexa. BONES AND SOFT TISSUES: Diffuse severe spine degeneration. Widespread vacuum disc and severe disc space loss. Lower thoracic hyperostosis with some chronic interbody ankylosis. Chronic osteopenia. L1 inferior endplate compression fracture is stable from last year. Similar inferior endplate compression fracture of T12 is new. T12 vertebral body height now reduced to 15.2 mm versus 22.5 mm normally,  representing 32% loss of vertebral body height. No significant bone retropulsion. No focal soft tissue abnormality. IMPRESSION: 1. No acute or inflammatory process identified in the non-contrast abdomen or pelvis. 2. New T12 inferior endplate compression fracture since October 2024, with 32% loss of height. No complicating features. Stable chronic L1 compression fracture. 3. Chronic cholelithiasis, small right periumbilical hernia - containing a short segment  of transverse colon, and Aortic atherosclerosis. Electronically signed by: Helayne Hurst MD 05/21/2024 03:58 AM EST RP Workstation: [id number]     Procedures   Medications Ordered in the ED  apixaban  (ELIQUIS ) tablet 5 mg (5 mg Oral Given 05/21/24 0204)  clotrimazole (LOTRIMIN) 1 % cream (1 Application Topical Given 05/21/24 0527)  ondansetron  (ZOFRAN ) injection 4 mg (4 mg Intravenous Given 05/20/24 1934)  ondansetron  (ZOFRAN ) injection 4 mg (4 mg Intravenous Given 05/20/24 2223)  sodium chloride  0.9 % bolus 500 mL (0 mLs Intravenous Stopped 05/21/24 0434)                                    Medical Decision Making Amount and/or Complexity of Data Reviewed Labs: ordered. Radiology: ordered.  Risk Prescription drug management.   This patient presents to the ED for concern of nausea, this involves an extensive number of treatment options, and is a complaint that carries with it a high risk of complications and morbidity.  The differential diagnosis includes but not limited to pancreatitis, ACS, arrhythmia, metabolic or electrolyte derangement, acute cholecystitis, gastritis   Co morbidities / Chronic conditions that complicate the patient evaluation  Obesity, OSA, hypertension, COPD, CHF, PAD, A-fib on Eliquis , diabetes, CKD.  Pain, cirrhosis   Additional history obtained:  Additional history obtained from EMR External records from outside source obtained and reviewed including prior echo on file dated 12/18/2023 with EF of 70 to  75%   Lab Tests:  I Ordered, and personally interpreted labs.  The pertinent results include: Initial troponin is elevated at 26, repeat is pending at time of signout from provider.  Lipase normal.  CMP with slightly elevated creatinine 1.45, elevated AST at 138 and ALT of 55.  CBC hemoconcentrated with hemoglobin 17.9   Imaging Studies ordered:  I ordered imaging studies including CT abdomen pelvis I independently visualized and interpreted imaging which showed negative for free air I agree with the radiologist interpretation   Cardiac Monitoring: / EKG:  The patient was maintained on a cardiac monitor.  I personally viewed and interpreted the cardiac monitored which showed an underlying rhythm of: A-fib, rate 63   Problem List / ED Course / Critical interventions / Medication management  83 year old female presents from home with report of nausea without vomiting since Friday when she did her last Mounjaro injection.  Patient has been on this medication for some time, no changes in her dose and has otherwise tolerated well until now.  She feels this is the cause of her symptoms.  She is not having abdominal pain or chest pain.  EKG reviewed, she is in A-fib which seems to be her baseline.  Her initial troponin is elevated. She is somewhat dehydrated with an elevated creatinine and concentrated hemoglobin.  Her LFTs are elevated, she does drink alcohol and have history of cirrhosis.  Her lipase is normal.  Her initial troponin was elevated at 26.  This was to be repeated however after multiple attempts to run the blood in the lab, there has been a delay in being able to get a result for this.  Plan is for lab to come and draw the blood.  Care is signed out at change of shift pending this result.   I ordered medication including gentle IV fluid bolus, Zofran , Lotrimin, home dose of Eliquis  Reevaluation of the patient after these medicines showed that the patient nausea improved, tolerating  POs I  have reviewed the patients home medicines and have made adjustments as needed   Consultations Obtained:  I requested consultation with the ER attending, Dr. Griselda,  and discussed lab and imaging findings as well as pertinent plan - they recommend: Agrees with plan of care   Social Determinants of Health:  Lives with family   Test / Admission - Considered:  Disposition pending at time of signout to oncoming provider      Final diagnoses:  Nausea  Yeast dermatitis  Elevated troponin  Transaminitis  Dehydration    ED Discharge Orders          Ordered    clotrimazole (LOTRIMIN) 1 % cream        05/21/24 0616               Beverley Leita LABOR, PA-C 05/21/24 [id number]    Griselda Norris, MD 05/24/24 1615

## 2024-05-21 ENCOUNTER — Emergency Department (HOSPITAL_COMMUNITY)

## 2024-05-21 LAB — COMPREHENSIVE METABOLIC PANEL WITH GFR
ALT: 55 U/L — ABNORMAL HIGH (ref 0–44)
AST: 138 U/L — ABNORMAL HIGH (ref 15–41)
Albumin: 3.2 g/dL — ABNORMAL LOW (ref 3.5–5.0)
Alkaline Phosphatase: 69 U/L (ref 38–126)
Anion gap: 15 (ref 5–15)
BUN: 35 mg/dL — ABNORMAL HIGH (ref 8–23)
CO2: 25 mmol/L (ref 22–32)
Calcium: 8 mg/dL — ABNORMAL LOW (ref 8.9–10.3)
Chloride: 100 mmol/L (ref 98–111)
Creatinine, Ser: 1.45 mg/dL — ABNORMAL HIGH (ref 0.44–1.00)
GFR, Estimated: 36 mL/min — ABNORMAL LOW (ref >60)
Glucose, Bld: 110 mg/dL — ABNORMAL HIGH (ref 70–99)
Potassium: 3.7 mmol/L (ref 3.5–5.1)
Sodium: 139 mmol/L (ref 135–145)
Total Bilirubin: 1.2 mg/dL (ref 0.0–1.2)
Total Protein: 6.5 g/dL (ref 6.5–8.1)

## 2024-05-21 LAB — TROPONIN T, HIGH SENSITIVITY
Troponin T High Sensitivity: 26 ng/L — ABNORMAL HIGH (ref 0–19)
Troponin T High Sensitivity: 29 ng/L — ABNORMAL HIGH (ref 0–19)

## 2024-05-21 LAB — LIPASE, BLOOD: Lipase: 31 U/L (ref 11–51)

## 2024-05-21 MED ORDER — CLOTRIMAZOLE 1 % EX CREA
TOPICAL_CREAM | Freq: Two times a day (BID) | CUTANEOUS | Status: DC
  Filled 2024-05-21: qty 15

## 2024-05-21 MED ORDER — CLOTRIMAZOLE 1 % EX CREA
TOPICAL_CREAM | Freq: Two times a day (BID) | CUTANEOUS | Status: DC
  Administered 2024-05-21: 1 via TOPICAL
  Filled 2024-05-21: qty 15

## 2024-05-21 MED ORDER — ONDANSETRON 4 MG PO TBDP: 4.0000 mg | ORAL_TABLET | Freq: Four times a day (QID) | ORAL | 0 refills | Status: DC | PRN

## 2024-05-21 MED ORDER — APIXABAN 5 MG PO TABS
5.0000 mg | ORAL_TABLET | Freq: Two times a day (BID) | ORAL | Status: DC
  Administered 2024-05-21 (×2): 5 mg via ORAL
  Filled 2024-05-21 (×2): qty 1

## 2024-05-21 MED ORDER — ONDANSETRON 4 MG PO TBDP
4.0000 mg | ORAL_TABLET | Freq: Once | ORAL | Status: AC
  Administered 2024-05-21: 4 mg via ORAL
  Filled 2024-05-21: qty 1

## 2024-05-21 MED ORDER — CLOTRIMAZOLE 1 % EX CREA: TOPICAL_CREAM | CUTANEOUS | 0 refills | Status: DC

## 2024-05-21 MED ORDER — ONDANSETRON HCL 4 MG/2ML IJ SOLN
4.0000 mg | Freq: Once | INTRAMUSCULAR | Status: DC
  Filled 2024-05-21: qty 2

## 2024-05-21 NOTE — Discharge Instructions (Addendum)
 Your workup today was reassuring other than suspected dehydration secondary to the nausea.  You can take the nausea medication that we have prescribed as needed, follow-up closely with your primary care doctor.

## 2024-05-21 NOTE — ED Notes (Signed)
 Spoke with lab, they stated morning floor collection is a priority and that they would not leave the floor in order to get labs from 17. I spoke with Charge RN Vivien and we both agreed that we have attempted x4 times to collect with IV attempts and straight sticks all of which have hemolyzed and we find it unnecessary to stick the pt again.

## 2024-05-21 NOTE — ED Provider Notes (Signed)
 Accepted handoff at shift change from Leita Chancy PA-C. Please see prior provider note for more detail.   Briefly: Patient is 83 y.o.   DDX: concern for Severe nausea without vomiting after mounjaro -- not a new med, not a dose increase. Does have hx of afib. On eliquis . Initial troponin 26, no previously elevated that we know about. Has been pending delta troponin. Got small amount of fluids and nausea meds. If flat delta, okay to go.  Plan: Delta troponin is flat, 29 from 26.  Patient still having some ongoing nausea, but no other acute identifiable pathology, patient reassessed, discharged with nausea medication, encourage close PCP follow-up.     Rosan Sherlean DEL, PA-C 05/21/24 1407    Suzette Pac, MD 05/23/24 [phone]

## 2024-05-21 NOTE — ED Notes (Signed)
 Pt has been changed in to clean brief and had complete be change

## 2024-05-21 NOTE — ED Notes (Signed)
 Light green top hemolyzed again x2, Pt has been straight stuck and pulled off IV, both hemolyzed.

## 2024-05-21 NOTE — ED Notes (Signed)
 2nd troph sent

## 2024-05-22 ENCOUNTER — Ambulatory Visit: Payer: Self-pay

## 2024-05-22 ENCOUNTER — Encounter: Payer: Self-pay | Admitting: Family Medicine

## 2024-05-22 ENCOUNTER — Other Ambulatory Visit: Payer: Self-pay

## 2024-05-22 ENCOUNTER — Inpatient Hospital Stay (HOSPITAL_COMMUNITY)
Admission: EM | Admit: 2024-05-22 | Discharge: 2024-06-16 | DRG: 682 | Disposition: E | Attending: Critical Care Medicine | Admitting: Critical Care Medicine

## 2024-05-22 ENCOUNTER — Telehealth (INDEPENDENT_AMBULATORY_CARE_PROVIDER_SITE_OTHER): Admitting: Family Medicine

## 2024-05-22 ENCOUNTER — Emergency Department (HOSPITAL_COMMUNITY)

## 2024-05-22 ENCOUNTER — Encounter (HOSPITAL_COMMUNITY): Payer: Self-pay | Admitting: Emergency Medicine

## 2024-05-22 DIAGNOSIS — N1832 Chronic kidney disease, stage 3b: Secondary | ICD-10-CM | POA: Diagnosis present

## 2024-05-22 DIAGNOSIS — N390 Urinary tract infection, site not specified: Secondary | ICD-10-CM | POA: Diagnosis present

## 2024-05-22 DIAGNOSIS — Z7985 Long-term (current) use of injectable non-insulin antidiabetic drugs: Secondary | ICD-10-CM

## 2024-05-22 DIAGNOSIS — I951 Orthostatic hypotension: Secondary | ICD-10-CM | POA: Diagnosis present

## 2024-05-22 DIAGNOSIS — E1151 Type 2 diabetes mellitus with diabetic peripheral angiopathy without gangrene: Secondary | ICD-10-CM | POA: Diagnosis present

## 2024-05-22 DIAGNOSIS — R112 Nausea with vomiting, unspecified: Secondary | ICD-10-CM | POA: Insufficient documentation

## 2024-05-22 DIAGNOSIS — Z993 Dependence on wheelchair: Secondary | ICD-10-CM

## 2024-05-22 DIAGNOSIS — I272 Pulmonary hypertension, unspecified: Secondary | ICD-10-CM | POA: Diagnosis present

## 2024-05-22 DIAGNOSIS — E1122 Type 2 diabetes mellitus with diabetic chronic kidney disease: Secondary | ICD-10-CM | POA: Diagnosis present

## 2024-05-22 DIAGNOSIS — R194 Change in bowel habit: Secondary | ICD-10-CM

## 2024-05-22 DIAGNOSIS — Z86718 Personal history of other venous thrombosis and embolism: Secondary | ICD-10-CM

## 2024-05-22 DIAGNOSIS — Z9842 Cataract extraction status, left eye: Secondary | ICD-10-CM

## 2024-05-22 DIAGNOSIS — Z79899 Other long term (current) drug therapy: Secondary | ICD-10-CM

## 2024-05-22 DIAGNOSIS — K8020 Calculus of gallbladder without cholecystitis without obstruction: Secondary | ICD-10-CM | POA: Diagnosis present

## 2024-05-22 DIAGNOSIS — Z833 Family history of diabetes mellitus: Secondary | ICD-10-CM

## 2024-05-22 DIAGNOSIS — Z794 Long term (current) use of insulin: Secondary | ICD-10-CM

## 2024-05-22 DIAGNOSIS — R110 Nausea: Secondary | ICD-10-CM | POA: Diagnosis not present

## 2024-05-22 DIAGNOSIS — N179 Acute kidney failure, unspecified: Principal | ICD-10-CM | POA: Diagnosis present

## 2024-05-22 DIAGNOSIS — I472 Ventricular tachycardia, unspecified: Secondary | ICD-10-CM | POA: Diagnosis present

## 2024-05-22 DIAGNOSIS — E875 Hyperkalemia: Secondary | ICD-10-CM | POA: Diagnosis not present

## 2024-05-22 DIAGNOSIS — I493 Ventricular premature depolarization: Secondary | ICD-10-CM | POA: Diagnosis present

## 2024-05-22 DIAGNOSIS — L8990 Pressure ulcer of unspecified site, unspecified stage: Secondary | ICD-10-CM | POA: Insufficient documentation

## 2024-05-22 DIAGNOSIS — J189 Pneumonia, unspecified organism: Secondary | ICD-10-CM

## 2024-05-22 DIAGNOSIS — K429 Umbilical hernia without obstruction or gangrene: Secondary | ICD-10-CM | POA: Diagnosis present

## 2024-05-22 DIAGNOSIS — Z832 Family history of diseases of the blood and blood-forming organs and certain disorders involving the immune mechanism: Secondary | ICD-10-CM

## 2024-05-22 DIAGNOSIS — N183 Chronic kidney disease, stage 3 unspecified: Secondary | ICD-10-CM | POA: Diagnosis present

## 2024-05-22 DIAGNOSIS — L89322 Pressure ulcer of left buttock, stage 2: Secondary | ICD-10-CM | POA: Diagnosis present

## 2024-05-22 DIAGNOSIS — I4819 Other persistent atrial fibrillation: Secondary | ICD-10-CM | POA: Diagnosis present

## 2024-05-22 DIAGNOSIS — N170 Acute kidney failure with tubular necrosis: Principal | ICD-10-CM | POA: Diagnosis present

## 2024-05-22 DIAGNOSIS — I130 Hypertensive heart and chronic kidney disease with heart failure and stage 1 through stage 4 chronic kidney disease, or unspecified chronic kidney disease: Secondary | ICD-10-CM | POA: Diagnosis present

## 2024-05-22 DIAGNOSIS — J984 Other disorders of lung: Secondary | ICD-10-CM | POA: Diagnosis present

## 2024-05-22 DIAGNOSIS — E1165 Type 2 diabetes mellitus with hyperglycemia: Secondary | ICD-10-CM | POA: Diagnosis present

## 2024-05-22 DIAGNOSIS — E876 Hypokalemia: Secondary | ICD-10-CM | POA: Diagnosis not present

## 2024-05-22 DIAGNOSIS — D6869 Other thrombophilia: Secondary | ICD-10-CM | POA: Diagnosis present

## 2024-05-22 DIAGNOSIS — Z888 Allergy status to other drugs, medicaments and biological substances status: Secondary | ICD-10-CM

## 2024-05-22 DIAGNOSIS — L89620 Pressure ulcer of left heel, unstageable: Secondary | ICD-10-CM | POA: Diagnosis not present

## 2024-05-22 DIAGNOSIS — A419 Sepsis, unspecified organism: Secondary | ICD-10-CM

## 2024-05-22 DIAGNOSIS — G4733 Obstructive sleep apnea (adult) (pediatric): Secondary | ICD-10-CM | POA: Diagnosis present

## 2024-05-22 DIAGNOSIS — Z7401 Bed confinement status: Secondary | ICD-10-CM

## 2024-05-22 DIAGNOSIS — I4729 Other ventricular tachycardia: Secondary | ICD-10-CM | POA: Diagnosis present

## 2024-05-22 DIAGNOSIS — K7460 Unspecified cirrhosis of liver: Secondary | ICD-10-CM | POA: Diagnosis present

## 2024-05-22 DIAGNOSIS — K3184 Gastroparesis: Secondary | ICD-10-CM | POA: Diagnosis present

## 2024-05-22 DIAGNOSIS — M4854XA Collapsed vertebra, not elsewhere classified, thoracic region, initial encounter for fracture: Secondary | ICD-10-CM | POA: Diagnosis present

## 2024-05-22 DIAGNOSIS — E860 Dehydration: Secondary | ICD-10-CM | POA: Diagnosis present

## 2024-05-22 DIAGNOSIS — T82110A Breakdown (mechanical) of cardiac electrode, initial encounter: Secondary | ICD-10-CM | POA: Diagnosis present

## 2024-05-22 DIAGNOSIS — Z515 Encounter for palliative care: Secondary | ICD-10-CM

## 2024-05-22 DIAGNOSIS — K828 Other specified diseases of gallbladder: Secondary | ICD-10-CM | POA: Diagnosis present

## 2024-05-22 DIAGNOSIS — Z9841 Cataract extraction status, right eye: Secondary | ICD-10-CM

## 2024-05-22 DIAGNOSIS — Z808 Family history of malignant neoplasm of other organs or systems: Secondary | ICD-10-CM

## 2024-05-22 DIAGNOSIS — E7849 Other hyperlipidemia: Secondary | ICD-10-CM | POA: Diagnosis present

## 2024-05-22 DIAGNOSIS — M4856XA Collapsed vertebra, not elsewhere classified, lumbar region, initial encounter for fracture: Secondary | ICD-10-CM | POA: Diagnosis present

## 2024-05-22 DIAGNOSIS — Z59868 Other specified financial insecurity: Secondary | ICD-10-CM

## 2024-05-22 DIAGNOSIS — G9341 Metabolic encephalopathy: Secondary | ICD-10-CM | POA: Diagnosis not present

## 2024-05-22 DIAGNOSIS — Z882 Allergy status to sulfonamides status: Secondary | ICD-10-CM

## 2024-05-22 DIAGNOSIS — E46 Unspecified protein-calorie malnutrition: Secondary | ICD-10-CM | POA: Diagnosis present

## 2024-05-22 DIAGNOSIS — Z87442 Personal history of urinary calculi: Secondary | ICD-10-CM

## 2024-05-22 DIAGNOSIS — Z8379 Family history of other diseases of the digestive system: Secondary | ICD-10-CM

## 2024-05-22 DIAGNOSIS — E1143 Type 2 diabetes mellitus with diabetic autonomic (poly)neuropathy: Secondary | ICD-10-CM | POA: Diagnosis present

## 2024-05-22 DIAGNOSIS — I10 Essential (primary) hypertension: Secondary | ICD-10-CM | POA: Diagnosis present

## 2024-05-22 DIAGNOSIS — R197 Diarrhea, unspecified: Secondary | ICD-10-CM | POA: Diagnosis present

## 2024-05-22 DIAGNOSIS — L89622 Pressure ulcer of left heel, stage 2: Secondary | ICD-10-CM | POA: Diagnosis not present

## 2024-05-22 DIAGNOSIS — E1169 Type 2 diabetes mellitus with other specified complication: Secondary | ICD-10-CM | POA: Diagnosis present

## 2024-05-22 DIAGNOSIS — Z66 Do not resuscitate: Secondary | ICD-10-CM | POA: Diagnosis present

## 2024-05-22 DIAGNOSIS — Z881 Allergy status to other antibiotic agents status: Secondary | ICD-10-CM

## 2024-05-22 DIAGNOSIS — Z8249 Family history of ischemic heart disease and other diseases of the circulatory system: Secondary | ICD-10-CM

## 2024-05-22 DIAGNOSIS — I2510 Atherosclerotic heart disease of native coronary artery without angina pectoris: Secondary | ICD-10-CM | POA: Diagnosis present

## 2024-05-22 DIAGNOSIS — Z6841 Body Mass Index (BMI) 40.0 and over, adult: Secondary | ICD-10-CM

## 2024-05-22 DIAGNOSIS — Y712 Prosthetic and other implants, materials and accessory cardiovascular devices associated with adverse incidents: Secondary | ICD-10-CM | POA: Diagnosis present

## 2024-05-22 DIAGNOSIS — J449 Chronic obstructive pulmonary disease, unspecified: Secondary | ICD-10-CM | POA: Diagnosis present

## 2024-05-22 DIAGNOSIS — K439 Ventral hernia without obstruction or gangrene: Secondary | ICD-10-CM | POA: Diagnosis present

## 2024-05-22 DIAGNOSIS — I5043 Acute on chronic combined systolic (congestive) and diastolic (congestive) heart failure: Secondary | ICD-10-CM | POA: Diagnosis not present

## 2024-05-22 DIAGNOSIS — J810 Acute pulmonary edema: Secondary | ICD-10-CM | POA: Insufficient documentation

## 2024-05-22 DIAGNOSIS — I509 Heart failure, unspecified: Secondary | ICD-10-CM

## 2024-05-22 DIAGNOSIS — Z961 Presence of intraocular lens: Secondary | ICD-10-CM | POA: Diagnosis present

## 2024-05-22 DIAGNOSIS — E872 Acidosis, unspecified: Secondary | ICD-10-CM | POA: Diagnosis not present

## 2024-05-22 DIAGNOSIS — I480 Paroxysmal atrial fibrillation: Secondary | ICD-10-CM | POA: Diagnosis present

## 2024-05-22 DIAGNOSIS — R570 Cardiogenic shock: Secondary | ICD-10-CM | POA: Diagnosis not present

## 2024-05-22 DIAGNOSIS — Z860100 Personal history of colon polyps, unspecified: Secondary | ICD-10-CM

## 2024-05-22 DIAGNOSIS — E1121 Type 2 diabetes mellitus with diabetic nephropathy: Secondary | ICD-10-CM | POA: Diagnosis present

## 2024-05-22 DIAGNOSIS — G9340 Encephalopathy, unspecified: Secondary | ICD-10-CM

## 2024-05-22 DIAGNOSIS — D6959 Other secondary thrombocytopenia: Secondary | ICD-10-CM | POA: Diagnosis present

## 2024-05-22 DIAGNOSIS — R6521 Severe sepsis with septic shock: Secondary | ICD-10-CM | POA: Diagnosis not present

## 2024-05-22 DIAGNOSIS — J441 Chronic obstructive pulmonary disease with (acute) exacerbation: Secondary | ICD-10-CM | POA: Diagnosis present

## 2024-05-22 DIAGNOSIS — J440 Chronic obstructive pulmonary disease with acute lower respiratory infection: Secondary | ICD-10-CM | POA: Diagnosis not present

## 2024-05-22 DIAGNOSIS — D122 Benign neoplasm of ascending colon: Secondary | ICD-10-CM | POA: Diagnosis present

## 2024-05-22 DIAGNOSIS — E871 Hypo-osmolality and hyponatremia: Secondary | ICD-10-CM | POA: Diagnosis present

## 2024-05-22 DIAGNOSIS — Z95810 Presence of automatic (implantable) cardiac defibrillator: Secondary | ICD-10-CM

## 2024-05-22 DIAGNOSIS — I4892 Unspecified atrial flutter: Secondary | ICD-10-CM | POA: Diagnosis present

## 2024-05-22 DIAGNOSIS — Z87440 Personal history of urinary (tract) infections: Secondary | ICD-10-CM

## 2024-05-22 DIAGNOSIS — L89522 Pressure ulcer of left ankle, stage 2: Secondary | ICD-10-CM | POA: Diagnosis present

## 2024-05-22 DIAGNOSIS — E039 Hypothyroidism, unspecified: Secondary | ICD-10-CM | POA: Diagnosis present

## 2024-05-22 DIAGNOSIS — B372 Candidiasis of skin and nail: Secondary | ICD-10-CM | POA: Diagnosis present

## 2024-05-22 DIAGNOSIS — T82120A Displacement of cardiac electrode, initial encounter: Secondary | ICD-10-CM | POA: Diagnosis present

## 2024-05-22 DIAGNOSIS — Z7689 Persons encountering health services in other specified circumstances: Secondary | ICD-10-CM

## 2024-05-22 DIAGNOSIS — Z7901 Long term (current) use of anticoagulants: Secondary | ICD-10-CM

## 2024-05-22 DIAGNOSIS — J9601 Acute respiratory failure with hypoxia: Secondary | ICD-10-CM | POA: Diagnosis not present

## 2024-05-22 LAB — COMPREHENSIVE METABOLIC PANEL WITH GFR
ALT: 43 U/L (ref 0–44)
AST: 99 U/L — ABNORMAL HIGH (ref 15–41)
Albumin: 2.9 g/dL — ABNORMAL LOW (ref 3.5–5.0)
Alkaline Phosphatase: 55 U/L (ref 38–126)
Anion gap: 16 — ABNORMAL HIGH (ref 5–15)
BUN: 50 mg/dL — ABNORMAL HIGH (ref 8–23)
CO2: 22 mmol/L (ref 22–32)
Calcium: 7.8 mg/dL — ABNORMAL LOW (ref 8.9–10.3)
Chloride: 96 mmol/L — ABNORMAL LOW (ref 98–111)
Creatinine, Ser: 2.25 mg/dL — ABNORMAL HIGH (ref 0.44–1.00)
GFR, Estimated: 21 mL/min — ABNORMAL LOW (ref >60)
Glucose, Bld: 121 mg/dL — ABNORMAL HIGH (ref 70–99)
Potassium: 3.7 mmol/L (ref 3.5–5.1)
Sodium: 134 mmol/L — ABNORMAL LOW (ref 135–145)
Total Bilirubin: 2 mg/dL — ABNORMAL HIGH (ref 0.0–1.2)
Total Protein: 6.4 g/dL — ABNORMAL LOW (ref 6.5–8.1)

## 2024-05-22 LAB — CBC WITH DIFFERENTIAL/PLATELET
Abs Immature Granulocytes: 0.02 K/uL (ref 0.00–0.07)
Basophils Absolute: 0 K/uL (ref 0.0–0.1)
Basophils Relative: 0 %
Eosinophils Absolute: 0 K/uL (ref 0.0–0.5)
Eosinophils Relative: 0 %
HCT: 52.2 % — ABNORMAL HIGH (ref 36.0–46.0)
Hemoglobin: 17.8 g/dL — ABNORMAL HIGH (ref 12.0–15.0)
Immature Granulocytes: 0 %
Lymphocytes Relative: 31 %
Lymphs Abs: 2.5 K/uL (ref 0.7–4.0)
MCH: 29.9 pg (ref 26.0–34.0)
MCHC: 34.1 g/dL (ref 30.0–36.0)
MCV: 87.7 fL (ref 80.0–100.0)
Monocytes Absolute: 0.9 K/uL (ref 0.1–1.0)
Monocytes Relative: 11 %
Neutro Abs: 4.8 K/uL (ref 1.7–7.7)
Neutrophils Relative %: 58 %
Platelets: 71 K/uL — ABNORMAL LOW (ref 150–400)
RBC: 5.95 MIL/uL — ABNORMAL HIGH (ref 3.87–5.11)
RDW: 15.4 % (ref 11.5–15.5)
WBC: 8.3 K/uL (ref 4.0–10.5)
nRBC: 0 % (ref 0.0–0.2)

## 2024-05-22 LAB — I-STAT CG4 LACTIC ACID, ED: Lactic Acid, Venous: 2.5 mmol/L (ref 0.5–1.9)

## 2024-05-22 LAB — TROPONIN I (HIGH SENSITIVITY): Troponin I (High Sensitivity): 54 ng/L — ABNORMAL HIGH (ref <18)

## 2024-05-22 LAB — LIPASE, BLOOD: Lipase: 34 U/L (ref 11–51)

## 2024-05-22 LAB — CBG MONITORING, ED: Glucose-Capillary: 130 mg/dL — ABNORMAL HIGH (ref 70–99)

## 2024-05-22 MED ORDER — DIPHENHYDRAMINE HCL 50 MG/ML IJ SOLN
12.5000 mg | Freq: Once | INTRAMUSCULAR | Status: AC
  Administered 2024-05-22: 12.5 mg via INTRAVENOUS
  Filled 2024-05-22: qty 1

## 2024-05-22 MED ORDER — LACTATED RINGERS IV BOLUS
500.0000 mL | Freq: Once | INTRAVENOUS | Status: AC
  Administered 2024-05-22: 500 mL via INTRAVENOUS

## 2024-05-22 MED ORDER — LACTATED RINGERS IV BOLUS: 1000.0000 mL | Freq: Once | INTRAVENOUS | Status: DC

## 2024-05-22 MED ORDER — METOCLOPRAMIDE HCL 5 MG/ML IJ SOLN
10.0000 mg | Freq: Once | INTRAMUSCULAR | Status: AC
  Administered 2024-05-22: 10 mg via INTRAVENOUS
  Filled 2024-05-22: qty 2

## 2024-05-22 NOTE — Telephone Encounter (Signed)
 Will follow up with patient on next week to re-evaluate GI symptoms.

## 2024-05-22 NOTE — Telephone Encounter (Signed)
 Pt had video visit with pcp today.

## 2024-05-22 NOTE — Assessment & Plan Note (Signed)
 Healing well  Con't wound care with Home health

## 2024-05-22 NOTE — Telephone Encounter (Signed)
 Dr. Cyndee- based on the virtual visit with patient today, what are your thoughts on how long it will take before her wound clears the MRSA infection?

## 2024-05-22 NOTE — ED Triage Notes (Signed)
 Pt BIB [facility] from home, lives with daughter, pt reports fatigue, dizziness, n/v, syncope (no fall); decreased appetite x 6 days, pt seen at [facility] x 2 days ago for same, dx of dehydration, hx of Afib and pacemaker, v/s en route: 80/50, 90HR, RR16, 95% RA, CBG 145, pt given 500cc NS and 4mg  Zofran  en route

## 2024-05-22 NOTE — Progress Notes (Signed)
 Subjective:    Patient ID: Heidi Stark, female    DOB: 1941/01/30, 83 y.o.   MRN: [id number]  Chief Complaint  Patient presents with   Wound Check    HPI Patient is in today for wound check.  Discussed the use of AI scribe software for clinical note transcription with the patient, who gave verbal consent to proceed.  History of Present Illness Heidi Stark is an 83 year old female who presents with severe nausea, dehydration, and diarrhea.  She has been experiencing severe nausea and dehydration, which led to a visit to the emergency room yesterday. She is not wanting to drink or eat, contributing to her dehydration. She describes feeling 'real shaky' and has been sleeping a lot since returning home.  She reports having had 'horrible diarrhea' that occurred frequently but notes that it has subsided significantly. The onset of these symptoms coincided with the resumption of her Mounjaro injection, which she took on Friday. She associates the nausea and diarrhea with this medication, although she did not experience these symptoms when she initially started the treatment.  Her troponin levels were monitored during the emergency room visit, with the last recorded level being 29. She has a history of a foot sore, which is healing but not yet completely resolved. The sore is being monitored by a visiting nurse, who is expected to provide further input on its status.  She confirms experiencing severe nausea, dehydration, and diarrhea.    Past Medical History:  Diagnosis Date   Arthritis    hands, wrists (11/07/2016)   CHF (congestive heart failure) (HCC)    Chronic lower back pain    Constipation    DVT (deep venous thrombosis) (HCC)    History of blood transfusion 1990   related to OR   History of kidney stones    Hyperlipidemia    Hypertension    Joint pain    Lower extremity edema    OSA on CPAP    Persistent atrial fibrillation (HCC)    Pneumonia    several times  (11/07/2016)   Type II diabetes mellitus (HCC)     Past Surgical History:  Procedure Laterality Date   ATRIAL FIBRILLATION ABLATION N/A 11/07/2016   Procedure: Atrial Fibrillation Ablation;  Surgeon: Will Gladis Norton, MD;  Location: [facility];  Service: Cardiovascular;  Laterality: N/A;   BACK SURGERY     CARDIAC CATHETERIZATION     CARDIOVERSION N/A 08/11/2016   Procedure: CARDIOVERSION;  Surgeon: Ezra GORMAN Shuck, MD;  Location: [facility] ENDOSCOPY;  Service: Cardiovascular;  Laterality: N/A;   CARDIOVERSION N/A 12/03/2017   Procedure: CARDIOVERSION;  Surgeon: Shuck Ezra GORMAN, MD;  Location: [facility] ENDOSCOPY;  Service: Cardiovascular;  Laterality: N/A;   CARDIOVERSION N/A 05/02/2018   Procedure: CARDIOVERSION;  Surgeon: Mona Vinie BROCKS, MD;  Location: [facility] ENDOSCOPY;  Service: Cardiovascular;  Laterality: N/A;   CARDIOVERSION N/A 06/23/2020   Procedure: CARDIOVERSION;  Surgeon: Hobart Powell BRAVO, MD;  Location: [facility] ENDOSCOPY;  Service: Cardiovascular;  Laterality: N/A;   CARDIOVERSION N/A 10/11/2020   Procedure: CARDIOVERSION;  Surgeon: Shuck Ezra GORMAN, MD;  Location: [facility] ENDOSCOPY;  Service: Cardiovascular;  Laterality: N/A;   CARPAL TUNNEL RELEASE     CATARACT EXTRACTION W/ INTRAOCULAR LENS  IMPLANT, BILATERAL Bilateral    COLON SURGERY  1990   vein graft and colon repair after nicked artery with back surgert   COLONOSCOPY     CORONARY ANGIOGRAPHY N/A 09/08/2020   Procedure: CORONARY ANGIOGRAPHY;  Surgeon: Shuck,  Ezra RAMAN, MD;  Location: [facility];  Service: Cardiovascular;  Laterality: N/A;   CYSTECTOMY     between bladder and kidneys   GANGLION CYST EXCISION Left    ICD IMPLANT N/A 09/15/2020   Procedure: ICD IMPLANT;  Surgeon: Inocencio Soyla Lunger, MD;  Location: [facility];  Service: Cardiovascular;  Laterality: N/A;   KNEE CARTILAGE SURGERY Right 1960s   LUMBAR DISC SURGERY  1990   REPAIR ILIAC ARTERY  1990   vein graft and colon repair after nicked  artery with back surgert   TEE WITHOUT CARDIOVERSION N/A 10/31/2013   Procedure: TRANSESOPHAGEAL ECHOCARDIOGRAM (TEE);  Surgeon: Ezra RAMAN Shuck, MD;  Location: [facility] ENDOSCOPY;  Service: Cardiovascular;  Laterality: N/A;   TUBAL LIGATION      Family History  Problem Relation Age of Onset   Diabetes Mother    Hypertension Mother    Cancer Mother    Obesity Mother    Diabetes Father    Obesity Father    Diabetes Other    Melanoma Other    Factor V Leiden deficiency Daughter     Social History   Socioeconomic History   Marital status: Married    Spouse name: [name]   Number of children: 2   Years of education: Not on file   Highest education level: Not on file  Occupational History   Occupation: Retired  Tobacco Use   Smoking status: Former    Current packs/day: 0.00    Average packs/day: 1 pack/day for 50.0 years (50.0 ttl pk-yrs)    Types: Cigarettes    Start date: 08/17/1970    Quit date: 08/17/2020    Years since quitting: 3.7   Smokeless tobacco: Never   Tobacco comments:    Quit 2022. [employer]  Vaping Use   Vaping status: Never Used  Substance and Sexual Activity   Alcohol use: No    Alcohol/week: 0.0 standard drinks of alcohol   Drug use: No   Sexual activity: Not on file  Other Topics Concern   Not on file  Social History Narrative   Married x 67 years.    1 son and 1 daughter.   1 grandson.   Social Drivers of Health   Financial Resource Strain: Medium Risk (10/13/2021)   Overall Financial Resource Strain (CARDIA)    Difficulty of Paying Living Expenses: Somewhat hard  Food Insecurity: No Food Insecurity (12/17/2023)   Hunger Vital Sign    Worried About Running Out of Food in the Last Year: Never true    Ran Out of Food in the Last Year: Never true  Transportation Needs: No Transportation Needs (12/17/2023)   PRAPARE - Administrator, Civil Service (Medical): No    Lack of Transportation (Non-Medical): No  Physical Activity: Insufficiently Active  (10/13/2021)   Exercise Vital Sign    Days of Exercise per Week: 2 days    Minutes of Exercise per Session: 20 min  Stress: No Stress Concern Present (10/13/2021)   [employer] of Occupational Health - Occupational Stress Questionnaire    Feeling of Stress : Not at all  Social Connections: Moderately Integrated (12/17/2023)   Social Connection and Isolation Panel    Frequency of Communication with Friends and Family: More than three times a week    Frequency of Social Gatherings with Friends and Family: More than three times a week    Attends Religious Services: 1 to 4 times per year    Active Member of  Clubs or Organizations: No    Attends Banker Meetings: 1 to 4 times per year    Marital Status: Widowed  Intimate Partner Violence: Not At Risk (12/17/2023)   Humiliation, Afraid, Rape, and Kick questionnaire    Fear of Current or Ex-Partner: No    Emotionally Abused: No    Physically Abused: No    Sexually Abused: No    Outpatient Medications Prior to Visit  Medication Sig Dispense Refill   acetaminophen  (TYLENOL ) 500 MG tablet Take 500 mg by mouth every 6 (six) hours as needed for moderate pain (pain score 4-6) or headache.     amiodarone  (PACERONE ) 200 MG tablet TAKE 1 TABLET BY MOUTH EVERY DAY 90 tablet 3   apixaban  (ELIQUIS ) 5 MG TABS tablet TAKE 1 TABLET BY MOUTH TWICE A DAY 180 tablet 1   BD PEN NEEDLE NANO 2ND GEN 32G X 4 MM MISC See admin instructions.     clotrimazole (LOTRIMIN) 1 % cream Apply to affected area 2 times daily 15 g 0   dapagliflozin  propanediol (FARXIGA ) 10 MG TABS tablet TAKE 1 TABLET BY MOUTH EVERY DAY BEFORE BREAKFAST 90 tablet 3   ENTRESTO  24-26 MG TAKE 1 TABLET BY MOUTH TWICE A DAY 180 tablet 8   eplerenone  (INSPRA ) 25 MG tablet Take 1 tablet (25 mg total) by mouth daily. 30 tablet 11   famotidine  (PEPCID ) 20 MG tablet Take 1 tablet (20 mg total) by mouth daily. 90 tablet 0   fenofibrate  (TRICOR ) 145 MG tablet TAKE 1 TABLET BY MOUTH EVERY  DAY 90 tablet 3   furosemide  (LASIX ) 40 MG tablet Take 40 mg by mouth 2 (two) times daily. Take 2 tablets by mouth twice daily     gabapentin  (NEURONTIN ) 300 MG capsule Take 300 mg by mouth 2 (two) times daily as needed (pain).     HUMALOG  MIX 75/25 KWIKPEN (75-25) 100 UNIT/ML KwikPen Take 10 units with morning meal and 10 units with evening meal (Patient taking differently: Inject 0-30 Units into the skin daily. If BS is  100 or < = No Insulin  100 - 140 = 10 Units Over 140 = 30 Units in the Morning and 20 Units at Bedtime)     levothyroxine  (SYNTHROID ) 25 MCG tablet Take 25 mcg by mouth as directed. 1 Tablet(s) By Mouth 4 Times a Week     LIVALO  4 MG TABS TAKE 1 TABLET BY MOUTH EVERY DAY 90 tablet 1   LORazepam  (ATIVAN ) 0.5 MG tablet Take 1 tablet (0.5 mg total) by mouth at bedtime. 30 tablet 1   metoprolol  succinate (TOPROL -XL) 25 MG 24 hr tablet TAKE 1 TABLET BY MOUTH EVERYDAY AT BEDTIME (Patient taking differently: Take 25 mg by mouth daily.) 90 tablet 3   mexiletine (MEXITIL ) 150 MG capsule TAKE 2 CAPSULES (300 MG TOTAL) BY MOUTH 2 (TWO) TIMES DAILY. 360 capsule 3   MOUNJARO 2.5 MG/0.5ML Pen Inject 2.5 mg into the skin once a week. Thursdays     Multiple Vitamin (MULTIVITAMIN WITH MINERALS) TABS tablet Take 1 tablet by mouth daily.     ondansetron  (ZOFRAN ) 8 MG tablet Take 1 tablet (8 mg total) by mouth every 8 (eight) hours as needed for nausea or vomiting. 30 tablet 0   ondansetron  (ZOFRAN -ODT) 4 MG disintegrating tablet Take 1 tablet (4 mg total) by mouth every 6 (six) hours as needed for nausea or vomiting. 20 tablet 0   ONETOUCH VERIO test strip 1 each 3 (three) times daily.  potassium chloride  (MICRO-K ) 10 MEQ CR capsule TAKE 1 CAPSULE BY MOUTH 2 TIMES DAILY. 60 capsule 3   sertraline  (ZOLOFT ) 25 MG tablet Take 1 tablet (25 mg total) by mouth daily. 90 tablet 2   traMADol (ULTRAM) 50 MG tablet Take 50 mg by mouth every 8 (eight) hours as needed.     VASCEPA  1 g capsule TAKE 2  CAPSULES BY MOUTH TWICE A DAY 120 capsule 5   doxycycline  (VIBRA -TABS) 100 MG tablet Take 1 tablet (100 mg total) by mouth 2 (two) times daily. (Patient not taking: Reported on 05/21/2024) 20 tablet 0   levothyroxine  (SYNTHROID ) 50 MCG tablet Take 1 tablet (50 mcg total) by mouth daily. (Patient not taking: Reported on 05/21/2024) 90 tablet 3   No facility-administered medications prior to visit.    Allergies  Allergen Reactions   Neomycin-Bacitracin Zn-Polymyx Rash   Sulfa Antibiotics Diarrhea and Nausea And Vomiting   Ciprofloxacin  Hives, Itching and Rash    Pt doesn't remember having any reaction to cipro    Spironolactone  Rash    Review of Systems  Constitutional:  Negative for fever and malaise/fatigue.  HENT:  Negative for congestion.   Eyes:  Negative for blurred vision.  Respiratory:  Negative for cough and shortness of breath.   Cardiovascular:  Negative for chest pain, palpitations and leg swelling.  Gastrointestinal:  Positive for diarrhea, nausea and vomiting.  Musculoskeletal:  Negative for back pain.  Skin:  Negative for rash.       Wound   Neurological:  Negative for loss of consciousness and headaches.       Objective:    Physical Exam Vitals and nursing note reviewed.  Skin:    Findings: Lesion present.     Comments: L heel ----  wound healing well  No drainage      There were no vitals taken for this visit. Wt Readings from Last 3 Encounters:  05/06/24 250 lb (113.4 kg)  04/21/24 252 lb (114.3 kg)  03/28/24 250 lb (113.4 kg)    Diabetic Foot Exam - Simple   No data filed    Lab Results  Component Value Date   WBC 7.3 05/20/2024   HGB 17.9 (H) 05/20/2024   HCT 55.5 (H) 05/20/2024   PLT 116 (L) 05/20/2024   GLUCOSE 110 (H) 05/21/2024   CHOL 152 09/14/2023   TRIG 307 (H) 09/14/2023   HDL 32 (L) 09/14/2023   LDLDIRECT 42.0 11/30/2014   LDLCALC 83 09/14/2023   ALT 55 (H) 05/21/2024   AST 138 (H) 05/21/2024   NA 139 05/21/2024   K 3.7  05/21/2024   CL 100 05/21/2024   CREATININE 1.45 (H) 05/21/2024   BUN 35 (H) 05/21/2024   CO2 25 05/21/2024   TSH 12.946 (H) 04/21/2024   INR 1.6 (H) 12/17/2023   HGBA1C 6.2 01/30/2024   MICROALBUR 12.8 10/26/2020    Lab Results  Component Value Date   TSH 12.946 (H) 04/21/2024   Lab Results  Component Value Date   WBC 7.3 05/20/2024   HGB 17.9 (H) 05/20/2024   HCT 55.5 (H) 05/20/2024   MCV 89.5 05/20/2024   PLT 116 (L) 05/20/2024   Lab Results  Component Value Date   NA 139 05/21/2024   K 3.7 05/21/2024   CO2 25 05/21/2024   GLUCOSE 110 (H) 05/21/2024   BUN 35 (H) 05/21/2024   CREATININE 1.45 (H) 05/21/2024   BILITOT 1.2 05/21/2024   ALKPHOS 69 05/21/2024   AST 138 (H) 05/21/2024  ALT 55 (H) 05/21/2024   PROT 6.5 05/21/2024   ALBUMIN 3.2 (L) 05/21/2024   CALCIUM  8.0 (L) 05/21/2024   ANIONGAP 15 05/21/2024   EGFR 32 (L) 02/03/2022   GFR 37.04 (L) 01/30/2024   Lab Results  Component Value Date   CHOL 152 09/14/2023   Lab Results  Component Value Date   HDL 32 (L) 09/14/2023   Lab Results  Component Value Date   LDLCALC 83 09/14/2023   Lab Results  Component Value Date   TRIG 307 (H) 09/14/2023   Lab Results  Component Value Date   CHOLHDL 4.8 09/14/2023   Lab Results  Component Value Date   HGBA1C 6.2 01/30/2024       Assessment & Plan:  Pressure sore on heel, left, unstageable (HCC)  Pressure injury of left heel, stage 2 (HCC) Assessment & Plan: Healing well  Con't wound care with Home health   Nausea Assessment & Plan: With vomiting and diarrhea  Stop mounjaro---   f/u endo   Assessment and Plan Assessment & Plan Pressure ulcer of left heel   The pressure ulcer on her left heel is improving with no drainage but is not fully healed. The cardiologist is awaiting complete healing before proceeding with a planned procedure. She has been referred to a wound clinic for expedited healing, and a nurse's assessment and thoughts on the  ulcer have been requested.  Dehydration   Severe dehydration due to nausea and diarrhea led to an emergency room visit. She is not adequately hydrating, contributing to shakiness and weakness. Increased fluid intake is encouraged to prevent hospital admission.  Nausea and vomiting   Severe nausea and vomiting likely related to the Mounjaro injection began after the last injection on Friday. Symptoms are expected to improve as the medication's half-life is approximately 21 days. Mounjaro injection has been discontinued, and Dr. Tommas has been informed about the adverse effects.  Diarrhea   Severe diarrhea coinciding with nausea and vomiting is likely related to the Mounjaro injection. Symptoms have subsided significantly. Mounjaro injection has been discontinued.  Type 2 diabetes mellitus   Recent adverse effects from Mounjaro have led to the discontinuation of the medication. Dr. Balan has been informed about the discontinuation.    Demond Shallenberger R Lowne Chase, DO

## 2024-05-22 NOTE — Assessment & Plan Note (Signed)
 With vomiting and diarrhea  Stop mounjaro---   f/u endo

## 2024-05-22 NOTE — Telephone Encounter (Signed)
 FYI Only or Action Required?: Action required by provider: clinical question for provider and update on patient condition.  Patient was last seen in primary care on 05/22/2024 by Antonio Meth, Jamee SAUNDERS, DO.  Called Nurse Triage reporting Nausea.  Symptoms began several days ago.  Interventions attempted: Prescription medications: Zofran .  Symptoms are: gradually worsening.  Triage Disposition: Call PCP Within 24 Hours  Patient/caregiver understands and will follow disposition?: Yes     Copied from CRM #[id number]. Topic: Clinical - Red Word Triage >> May 22, 2024 10:50 AM Carlyon D wrote: Red Word that prompted transfer to Nurse Triage: Pt daughter Jenna calling in regards to mother being seen in ER and was givien medication due to her being severely nauseated and vomiting Daughter is concerned as mom can't keep anything down and the medication from hospital is not helping.      Reason for Disposition  Taking prescription medication that could cause nausea (e.g., narcotics/opiates, antibiotics, OCPs, many others)    Picked due to patient being seen this morning by PCP  Answer Assessment - Initial Assessment Questions Patient's daughter calling to ask for something stronger to be prescribed for the patient's nausea, stating that her nausea has worsened since the patient was seen earlier during a video visit. Please advise.   [name], her daughter, can be reached at [phone]   1. NAUSEA SEVERITY: How bad is the nausea? (e.g., mild, moderate, severe; dehydration, weight loss)     Moderate to severe  2. ONSET: When did the nausea begin?     5 days  3. VOMITING: Any vomiting? If Yes, ask: How many times today?     Yes 4. RECURRENT SYMPTOM: Have you had nausea before? If Yes, ask: When was the last time? What happened that time?     Yes 5. CAUSE: What do you think is causing the nausea?     Unsure  Protocols used: Nausea-A-AH

## 2024-05-22 NOTE — ED Provider Notes (Signed)
 [facility] AT [facility] Provider Note   CSN: [id number] Arrival date & time: 05/22/24  2109     Patient presents with: Hypotension   Heidi Stark is a 83 y.o. female.  {Add pertinent medical, surgical, social history, OB history to HPI:3464} 83 year old female with history of obesity and diabetes on Mounjaro, CHF, hypertension, atrial fibrillation and DVT on Eliquis , and defibrillator who presents to the emergency department with nausea and vomiting and weakness.  History obtained from patient and her daughter.  For approximately a week she has had nausea and vomiting and decreased appetite.  Today has had approximately 7 episodes nonbloody nonbilious emesis.  When this first started she was also having diarrhea which is resolved.  This coincided when she took her Mounjaro and they think that is likely the cause of her symptoms.  She came into the emergency department on 11/4 and had a CT scan which showed no acute process.  Patient denies any abdominal pain to me.  No fevers.  Says that she has had some chest discomfort with the vomiting and that her defibrillator has been alarming but it has not fired.  Daughter reports that she did lose consciousness briefly today because she was feeling so weak       Prior to Admission medications   Medication Sig Start Date End Date Taking? Authorizing Provider  acetaminophen  (TYLENOL ) 500 MG tablet Take 500 mg by mouth every 6 (six) hours as needed for moderate pain (pain score 4-6) or headache.    [provider]  amiodarone  (PACERONE ) 200 MG tablet TAKE 1 TABLET BY MOUTH EVERY DAY 08/23/23   Rolan Ezra RAMAN, MD  apixaban  (ELIQUIS ) 5 MG TABS tablet TAKE 1 TABLET BY MOUTH TWICE A DAY 03/06/24   Rolan Ezra RAMAN, MD  BD PEN NEEDLE NANO 2ND GEN 32G X 4 MM MISC See admin instructions. 09/24/21   [provider]  clotrimazole (LOTRIMIN) 1 % cream Apply to affected area 2 times daily 05/21/24   Beverley Doffing A, PA-C  dapagliflozin  propanediol (FARXIGA ) 10 MG TABS tablet TAKE 1 TABLET BY MOUTH EVERY DAY BEFORE BREAKFAST 04/23/24   Rolan Ezra RAMAN, MD  ENTRESTO  24-26 MG TAKE 1 TABLET BY MOUTH TWICE A DAY 04/01/24   Rolan Ezra RAMAN, MD  eplerenone  (INSPRA ) 25 MG tablet Take 1 tablet (25 mg total) by mouth daily. 03/20/24   Rolan Ezra RAMAN, MD  famotidine  (PEPCID ) 20 MG tablet Take 1 tablet (20 mg total) by mouth daily. 03/10/24   Antonio Cyndee Rockers R, DO  fenofibrate  (TRICOR ) 145 MG tablet TAKE 1 TABLET BY MOUTH EVERY DAY 01/22/23   Rolan Ezra RAMAN, MD  furosemide  (LASIX ) 40 MG tablet Take 40 mg by mouth 2 (two) times daily. Take 2 tablets by mouth twice daily 11/15/23   [provider]  gabapentin  (NEURONTIN ) 300 MG capsule Take 300 mg by mouth 2 (two) times daily as needed (pain). 07/31/22   [provider]  HUMALOG  MIX 75/25 KWIKPEN (75-25) 100 UNIT/ML KwikPen Take 10 units with morning meal and 10 units with evening meal Patient taking differently: Inject 0-30 Units into the skin daily. If BS is  100 or < = No Insulin  100 - 140 = 10 Units Over 140 = 30 Units in the Morning and 20 Units at Bedtime 12/27/23   Rojelio Nest, DO  levothyroxine  (SYNTHROID ) 25 MCG tablet Take 25 mcg by mouth as directed. 1 Tablet(s) By Mouth 4 Times a Week 04/06/24  [provider]  LIVALO  4 MG TABS TAKE 1 TABLET BY MOUTH EVERY DAY 03/06/24   Rolan Ezra RAMAN, MD  LORazepam  (ATIVAN ) 0.5 MG tablet Take 1 tablet (0.5 mg total) by mouth at bedtime. 03/10/24   Lowne Chase, Yvonne R, DO  metoprolol  succinate (TOPROL -XL) 25 MG 24 hr tablet TAKE 1 TABLET BY MOUTH EVERYDAY AT BEDTIME Patient taking differently: Take 25 mg by mouth daily. 04/23/23   Hayes Beckey CROME, NP  mexiletine (MEXITIL ) 150 MG capsule TAKE 2 CAPSULES (300 MG TOTAL) BY MOUTH 2 (TWO) TIMES DAILY. 05/10/23   Camnitz, Soyla Lunger, MD  MOUNJARO 2.5 MG/0.5ML Pen Inject 2.5 mg into the skin once a week. Thursdays    [provider]   Multiple Vitamin (MULTIVITAMIN WITH MINERALS) TABS tablet Take 1 tablet by mouth daily.    [provider]  ondansetron  (ZOFRAN ) 8 MG tablet Take 1 tablet (8 mg total) by mouth every 8 (eight) hours as needed for nausea or vomiting. 05/02/24   [name], DO  ondansetron  (ZOFRAN -ODT) 4 MG disintegrating tablet Take 1 tablet (4 mg total) by mouth every 6 (six) hours as needed for nausea or vomiting. 05/21/24   Prosperi, Christian H, PA-C  ONETOUCH VERIO test strip 1 each 3 (three) times daily. 10/09/21   [provider]  potassium chloride  (MICRO-K ) 10 MEQ CR capsule TAKE 1 CAPSULE BY MOUTH 2 TIMES DAILY. 05/12/24   Bensimhon, Toribio JONELLE, MD  sertraline  (ZOLOFT ) 25 MG tablet Take 1 tablet (25 mg total) by mouth daily. 03/10/24   Lowne Chase, Yvonne R, DO  traMADol (ULTRAM) 50 MG tablet Take 50 mg by mouth every 8 (eight) hours as needed. 02/14/24   [provider]  VASCEPA  1 g capsule TAKE 2 CAPSULES BY MOUTH TWICE A DAY 10/10/23   Rolan Ezra RAMAN, MD    Allergies: Neomycin-bacitracin zn-polymyx, Sulfa antibiotics, Ciprofloxacin , and Spironolactone     Review of Systems  Updated Vital Signs BP (!) 102/49   Pulse 78   Temp 97.8 F (36.6 C) (Oral)   Resp 18   Ht 5' 6 (1.676 m)   Wt 113.4 kg   SpO2 95%   BMI 40.35 kg/m   Physical Exam Vitals and nursing note reviewed.  Constitutional:      General: She is not in acute distress.    Appearance: She is well-developed.     Comments: Uncomfortable and tired appearing  HENT:     Head: Normocephalic and atraumatic.     Right Ear: External ear normal.     Left Ear: External ear normal.     Nose: Nose normal.  Eyes:     Extraocular Movements: Extraocular movements intact.     Conjunctiva/sclera: Conjunctivae normal.     Pupils: Pupils are equal, round, and reactive to light.  Cardiovascular:     Rate and Rhythm: Normal rate. Rhythm irregular.     Heart sounds: No murmur heard. Pulmonary:     Effort:  Pulmonary effort is normal. No respiratory distress.     Breath sounds: Normal breath sounds.  Abdominal:     General: Abdomen is flat. There is no distension.     Palpations: Abdomen is soft. There is no mass.     Tenderness: There is no abdominal tenderness. There is no guarding.  Musculoskeletal:     Cervical back: Normal range of motion and neck supple.     Right lower leg: No edema.     Left lower leg: No edema.  Skin:    General: Skin is warm and dry.  Neurological:     Mental Status: She is alert and oriented to person, place, and time. Mental status is at baseline.  Psychiatric:        Mood and Affect: Mood normal.     (all labs ordered are listed, but only abnormal results are displayed) Labs Reviewed  COMPREHENSIVE METABOLIC PANEL WITH GFR - Abnormal; Notable for the following components:      Result Value   Sodium 134 (*)    Chloride 96 (*)    Glucose, Bld 121 (*)    BUN 50 (*)    Creatinine, Ser 2.25 (*)    Calcium  7.8 (*)    Total Protein 6.4 (*)    Albumin 2.9 (*)    AST 99 (*)    Total Bilirubin 2.0 (*)    GFR, Estimated 21 (*)    Anion gap 16 (*)    All other components within normal limits  CBC WITH DIFFERENTIAL/PLATELET - Abnormal; Notable for the following components:   RBC 5.95 (*)    Hemoglobin 17.8 (*)    HCT 52.2 (*)    Platelets 71 (*)    All other components within normal limits  I-STAT CG4 LACTIC ACID, ED - Abnormal; Notable for the following components:   Lactic Acid, Venous 2.5 (*)    All other components within normal limits  CBG MONITORING, ED - Abnormal; Notable for the following components:   Glucose-Capillary 130 (*)    All other components within normal limits  TROPONIN I (HIGH SENSITIVITY) - Abnormal; Notable for the following components:   Troponin I (High Sensitivity) 54 (*)    All other components within normal limits  LIPASE, BLOOD  URINALYSIS, ROUTINE W REFLEX MICROSCOPIC  MAGNESIUM   I-STAT CG4 LACTIC ACID, ED  TROPONIN I  (HIGH SENSITIVITY)    EKG: EKG Interpretation Date/Time:  Thursday May 22 2024 21:13:01 EST Ventricular Rate:  91 PR Interval:    QRS Duration:  184 QT Interval:  437 QTC Calculation: 538 R Axis:   268  Text Interpretation: Atrial fibrillation Right bundle branch block Confirmed by Yolande Charleston [phone]) on 05/22/2024 11:06:53 PM  Radiology: [facility] Chest Portable 1 View Result Date: 05/22/2024 CLINICAL DATA:  Chest pain hypotension EXAM: PORTABLE CHEST 1 VIEW COMPARISON:  02/29/2024 FINDINGS: Left-sided single lead pacing device. Cardiomegaly without focal opacity, pleural effusion or pneumothorax. Diffuse bronchitic changes, increased compared to prior. IMPRESSION: Cardiomegaly with diffuse bronchitic changes. Electronically Signed   By: Luke Bun M.D.   On: 05/22/2024 22:26   CT ABDOMEN PELVIS WO CONTRAST Result Date: 05/21/2024 EXAM: CT ABDOMEN AND PELVIS WITHOUT CONTRAST 05/21/2024 03:29:00 AM TECHNIQUE: CT of the abdomen and pelvis was performed without the administration of intravenous contrast. Multiplanar reformatted images are provided for review. Automated exposure control, iterative reconstruction, and/or weight-based adjustment of the mA/kV was utilized to reduce the radiation dose to as low as reasonably achievable. COMPARISON: CT abdomen and pelvis 05/12/2023 and earlier. CLINICAL HISTORY: 83 year old female. Abdominal pain, acute, nonlocalized. FINDINGS: LOWER CHEST: Stable cardiomegaly. Partially visible cardiac pacemaker lead. Calcified aortic atherosclerosis. No pericardial or pleural effusion. Chronic elevation of the left hemidiaphragm. Lung base ventilation improved from last year with some chronic lung base scarring. LIVER: Chronic hepatomegaly, liver length 25 cm. GALLBLADDER AND BILE DUCTS: Similar gallbladder distention to the CT last year. Chronic 6 mm gallstone. No regional inflammation. No bile duct dilatation. SPLEEN: Diminutive spleen. PANCREAS: No acute  abnormality. ADRENAL GLANDS:  Chronic asymmetric adrenal thickening greater on the left, suggesting adrenal hyperplasia. KIDNEYS, URETERS AND BLADDER: Non-contrast kidneys appear stable and nonobstructed. Urinary bladder distended similar to the CT last year. Vascular calcifications in the pelvis. No urinary calculus identified. No stones in the kidneys or ureters. No hydronephrosis. No perinephric or periureteral stranding. GI AND BOWEL: Redundant gas containing sigmoid colon in the left abdomen, but decompressed upstream and downstream large bowel. Chronic right periumbilical ventral abdominal hernia contains a short segment of the transverse colon as before on series 2 image 48; no evidence of acute incarceration. Upstream right colon decompressed with some diverticulosis. Normal gas containing appendix on series 2 image 57. Nondilated small bowel and stomach. No free air or free fluid. PERITONEUM AND RETROPERITONEUM: No ascites. No free air. VASCULATURE: Aorta is normal in caliber. Vascular calcifications in the pelvis. LYMPH NODES: No lymphadenopathy. REPRODUCTIVE ORGANS: Negative non-contrast uterus and adnexa. BONES AND SOFT TISSUES: Diffuse severe spine degeneration. Widespread vacuum disc and severe disc space loss. Lower thoracic hyperostosis with some chronic interbody ankylosis. Chronic osteopenia. L1 inferior endplate compression fracture is stable from last year. Similar inferior endplate compression fracture of T12 is new. T12 vertebral body height now reduced to 15.2 mm versus 22.5 mm normally, representing 32% loss of vertebral body height. No significant bone retropulsion. No focal soft tissue abnormality. IMPRESSION: 1. No acute or inflammatory process identified in the non-contrast abdomen or pelvis. 2. New T12 inferior endplate compression fracture since October 2024, with 32% loss of height. No complicating features. Stable chronic L1 compression fracture. 3. Chronic cholelithiasis, small right  periumbilical hernia - containing a short segment of transverse colon, and Aortic atherosclerosis. Electronically signed by: Helayne Hurst MD 05/21/2024 03:58 AM EST RP Workstation: [id number]    {Document cardiac monitor, telemetry assessment procedure when appropriate:32947} Procedures   Medications Ordered in the ED  lactated ringers  bolus 500 mL (0 mLs Intravenous Stopped 05/22/24 2301)  metoCLOPramide (REGLAN) injection 10 mg (10 mg Intravenous Given 05/22/24 2229)  diphenhydrAMINE (BENADRYL) injection 12.5 mg (12.5 mg Intravenous Given 05/22/24 2229)  lactated ringers  bolus 500 mL (500 mLs Intravenous New Bag/Given 05/22/24 2314)    Clinical Course as of 05/22/24 2351  Thu May 22, 2024  2251 Creatinine(!): 2.25 Baseline of 1.1. Yesterday was 1.45.  [RP]    Clinical Course User Index [RP] Yolande Lamar BROCKS, MD   {Click here for ABCD2, HEART and other calculators REFRESH Note before signing:1}                              Medical Decision Making Amount and/or Complexity of Data Reviewed Labs: ordered. Decision-making details documented in ED Course. Radiology: ordered.  Risk Prescription drug management. Decision regarding hospitalization.   83 year old female with history of obesity and diabetes on Mounjaro, CHF, hypertension, atrial fibrillation and DVT on Eliquis , and defibrillator who presents to the emergency department with nausea and vomiting and weakness.   Initial Ddx:  Gastroenteritis, Mounjaro side effect, AKI, dehydration, SBO, appendicitis, cholecystitis, ileus  MDM/Course:  Patient resents emergency department with nausea and vomiting..  Did have coincided with when she took Mounjaro.  Also was having diarrhea initially.  She is not having any abdominal pain.  No abdominal distention either.  On exam she appears somewhat tired but is not in acute distress.  Does have soft blood pressures as well which I suspect are from her GI losses.  She had lab work that shows  she  sees hemoconcentrated with a hemoglobin of 17.8.  She does have an AKI with a baseline creatinine of 1.1 and a creatinine today of 2.25.  Upon re-evaluation was feeling better after the Reglan.  With her chest discomfort did have an EKG which showed atrial fibrillation with a right bundle branch block.  Initial high-sensitivity troponin was 54 but is not having any chest pain at this point in time.  Will send repeat but suspect that this may be due to her kidney function and demand rather than ACS.  With her recent CT and no abdominal pain or distention do not feel that she needs repeat imaging today.  I suspect that she either had gastroenteritis or nausea and vomiting from her Mounjaro.  Will admit to the hospital with her AKI and to ensure that she is tolerating p.o. We will interrogate her device since it has been alarming (though not firing).  Device interrogation is pending at this time.  This patient presents to the ED for concern of complaints listed in HPI, this involves an extensive number of treatment options, and is a complaint that carries with it a high risk of complications and morbidity. Disposition including potential need for admission considered.   Dispo: {Disposition:28069}  Additional history obtained from daughter Records reviewed {Records Reviewed:28068} The following labs were independently interpreted: {labs interpreted:28064} and show {lab findings:28250} I independently reviewed the following imaging with scope of interpretation limited to determining acute life threatening conditions related to emergency care: {imaging interpreted:28065} and agree with the radiologist interpretation with the following exceptions: none I personally reviewed and interpreted cardiac monitoring: {cardiac monitoring:28251} I personally reviewed and interpreted the pt's EKG: see above for interpretation  I have reviewed the patients home medications and made adjustments as needed Consults:  {Consultants:28063} Social Determinants of health:  ***  Portions of this note were generated with Scientist, clinical (histocompatibility and immunogenetics). Dictation errors may occur despite best attempts at proofreading.     Final diagnoses:  None    ED Discharge Orders     None

## 2024-05-22 NOTE — Telephone Encounter (Signed)
  FYI Only or Action Required?: Action required by provider: update on patient condition.  Patient was last seen in primary care on 05/22/2024 by Antonio Meth, Jamee SAUNDERS, DO.  Called Nurse Triage reporting Nausea and Vomiting.  Symptoms began several days ago.  Interventions attempted: Prescription medications: zofran .  Symptoms are: unchanged.  Triage Disposition: Go to ED Now (or PCP Triage)  Patient/caregiver understands and will follow disposition?: Yes Copied from CRM #[id number]. Topic: Clinical - Red Word Triage >> May 22, 2024  4:57 PM Ashley R wrote: Red Word that prompted transfer to Nurse Triage: Triaged today, ED visit yesterday,CHF, Triponin level up, throwing up more, nausea medication already given at ED still not working.   Requesting another medication to help with nausea, would prefer not to go back to ED. Under the impression a RX would be sent post last triage/video visit Reason for Disposition  [1] SEVERE vomiting (e.g., 6 or more times/day) AND [2] present > 8 hours (Exception: Patient sounds well, is drinking liquids, does not sound dehydrated, and vomiting has lasted less than 24 hours.)  Answer Assessment - Initial Assessment Questions Blood sugar at time of call, 1700, 106   1. VOMITING SEVERITY: How many times have you vomited in the past 24 hours?      6 times since this morning 2. ONSET: When did the vomiting begin?      Tuesday 3. FLUIDS: What fluids or food have you vomited up today? Have you been able to keep any fluids down?     Unable to keep down fluids 4. ABDOMEN PAIN: Are your having any abdomen pain? If Yes : How bad is it and what does it feel like? (e.g., crampy, dull, intermittent, constant)      denies 5. DIARRHEA: Is there any diarrhea? If Yes, ask: How many times today?      No diarrhea today 6. CONTACTS: Is there anyone else in the family with the same symptoms?      denies 7. CAUSE: What do you think is causing your  vomiting?     Per Dr. Jennefer OV note, nausea may be related to mounjaro, that medication was d/c'd 8. HYDRATION STATUS: Any signs of dehydration? (e.g., dry mouth [not only dry lips], too weak to stand) When did you last urinate?     Dizziness, feels like passing out, more shaky  Patient will attempt one more trial of zofran  followed by ice chips and propel.  If vomiting continues, she will go to ED  Protocols used: Vomiting-A-AH

## 2024-05-23 DIAGNOSIS — E1121 Type 2 diabetes mellitus with diabetic nephropathy: Secondary | ICD-10-CM

## 2024-05-23 DIAGNOSIS — I480 Paroxysmal atrial fibrillation: Secondary | ICD-10-CM

## 2024-05-23 DIAGNOSIS — I130 Hypertensive heart and chronic kidney disease with heart failure and stage 1 through stage 4 chronic kidney disease, or unspecified chronic kidney disease: Secondary | ICD-10-CM | POA: Diagnosis present

## 2024-05-23 DIAGNOSIS — N390 Urinary tract infection, site not specified: Secondary | ICD-10-CM | POA: Diagnosis present

## 2024-05-23 DIAGNOSIS — E46 Unspecified protein-calorie malnutrition: Secondary | ICD-10-CM | POA: Diagnosis present

## 2024-05-23 DIAGNOSIS — Y712 Prosthetic and other implants, materials and accessory cardiovascular devices associated with adverse incidents: Secondary | ICD-10-CM | POA: Diagnosis present

## 2024-05-23 DIAGNOSIS — E1122 Type 2 diabetes mellitus with diabetic chronic kidney disease: Secondary | ICD-10-CM | POA: Diagnosis present

## 2024-05-23 DIAGNOSIS — R570 Cardiogenic shock: Secondary | ICD-10-CM | POA: Diagnosis not present

## 2024-05-23 DIAGNOSIS — J441 Chronic obstructive pulmonary disease with (acute) exacerbation: Secondary | ICD-10-CM | POA: Diagnosis present

## 2024-05-23 DIAGNOSIS — T82198A Other mechanical complication of other cardiac electronic device, initial encounter: Secondary | ICD-10-CM | POA: Diagnosis not present

## 2024-05-23 DIAGNOSIS — L89522 Pressure ulcer of left ankle, stage 2: Secondary | ICD-10-CM | POA: Diagnosis present

## 2024-05-23 DIAGNOSIS — Z95810 Presence of automatic (implantable) cardiac defibrillator: Secondary | ICD-10-CM

## 2024-05-23 DIAGNOSIS — Z66 Do not resuscitate: Secondary | ICD-10-CM | POA: Diagnosis present

## 2024-05-23 DIAGNOSIS — E860 Dehydration: Secondary | ICD-10-CM | POA: Diagnosis present

## 2024-05-23 DIAGNOSIS — N1832 Chronic kidney disease, stage 3b: Secondary | ICD-10-CM | POA: Diagnosis present

## 2024-05-23 DIAGNOSIS — A419 Sepsis, unspecified organism: Secondary | ICD-10-CM | POA: Diagnosis not present

## 2024-05-23 DIAGNOSIS — N1831 Chronic kidney disease, stage 3a: Secondary | ICD-10-CM

## 2024-05-23 DIAGNOSIS — D6959 Other secondary thrombocytopenia: Secondary | ICD-10-CM | POA: Diagnosis present

## 2024-05-23 DIAGNOSIS — I4729 Other ventricular tachycardia: Secondary | ICD-10-CM | POA: Diagnosis present

## 2024-05-23 DIAGNOSIS — J449 Chronic obstructive pulmonary disease, unspecified: Secondary | ICD-10-CM | POA: Diagnosis not present

## 2024-05-23 DIAGNOSIS — N170 Acute kidney failure with tubular necrosis: Secondary | ICD-10-CM | POA: Diagnosis present

## 2024-05-23 DIAGNOSIS — I5043 Acute on chronic combined systolic (congestive) and diastolic (congestive) heart failure: Secondary | ICD-10-CM | POA: Diagnosis not present

## 2024-05-23 DIAGNOSIS — Z6841 Body Mass Index (BMI) 40.0 and over, adult: Secondary | ICD-10-CM | POA: Diagnosis not present

## 2024-05-23 DIAGNOSIS — J42 Unspecified chronic bronchitis: Secondary | ICD-10-CM

## 2024-05-23 DIAGNOSIS — J9601 Acute respiratory failure with hypoxia: Secondary | ICD-10-CM | POA: Diagnosis not present

## 2024-05-23 DIAGNOSIS — I509 Heart failure, unspecified: Secondary | ICD-10-CM | POA: Diagnosis not present

## 2024-05-23 DIAGNOSIS — I472 Ventricular tachycardia, unspecified: Secondary | ICD-10-CM

## 2024-05-23 DIAGNOSIS — J189 Pneumonia, unspecified organism: Secondary | ICD-10-CM | POA: Diagnosis not present

## 2024-05-23 DIAGNOSIS — E785 Hyperlipidemia, unspecified: Secondary | ICD-10-CM

## 2024-05-23 DIAGNOSIS — I4819 Other persistent atrial fibrillation: Secondary | ICD-10-CM | POA: Diagnosis not present

## 2024-05-23 DIAGNOSIS — R110 Nausea: Secondary | ICD-10-CM | POA: Diagnosis not present

## 2024-05-23 DIAGNOSIS — R112 Nausea with vomiting, unspecified: Secondary | ICD-10-CM | POA: Diagnosis not present

## 2024-05-23 DIAGNOSIS — R579 Shock, unspecified: Secondary | ICD-10-CM | POA: Diagnosis not present

## 2024-05-23 DIAGNOSIS — I959 Hypotension, unspecified: Secondary | ICD-10-CM | POA: Diagnosis not present

## 2024-05-23 DIAGNOSIS — K7460 Unspecified cirrhosis of liver: Secondary | ICD-10-CM | POA: Diagnosis present

## 2024-05-23 DIAGNOSIS — I5022 Chronic systolic (congestive) heart failure: Secondary | ICD-10-CM | POA: Diagnosis not present

## 2024-05-23 DIAGNOSIS — R6521 Severe sepsis with septic shock: Secondary | ICD-10-CM | POA: Diagnosis not present

## 2024-05-23 DIAGNOSIS — E1169 Type 2 diabetes mellitus with other specified complication: Secondary | ICD-10-CM

## 2024-05-23 DIAGNOSIS — N179 Acute kidney failure, unspecified: Secondary | ICD-10-CM | POA: Diagnosis not present

## 2024-05-23 DIAGNOSIS — Z7901 Long term (current) use of anticoagulants: Secondary | ICD-10-CM | POA: Diagnosis not present

## 2024-05-23 DIAGNOSIS — I10 Essential (primary) hypertension: Secondary | ICD-10-CM | POA: Diagnosis not present

## 2024-05-23 DIAGNOSIS — Z515 Encounter for palliative care: Secondary | ICD-10-CM | POA: Diagnosis not present

## 2024-05-23 DIAGNOSIS — R197 Diarrhea, unspecified: Secondary | ICD-10-CM | POA: Diagnosis not present

## 2024-05-23 DIAGNOSIS — G9341 Metabolic encephalopathy: Secondary | ICD-10-CM | POA: Diagnosis not present

## 2024-05-23 LAB — CBC
HCT: 48.7 % — ABNORMAL HIGH (ref 36.0–46.0)
Hemoglobin: 16.5 g/dL — ABNORMAL HIGH (ref 12.0–15.0)
MCH: 29.8 pg (ref 26.0–34.0)
MCHC: 33.9 g/dL (ref 30.0–36.0)
MCV: 88.1 fL (ref 80.0–100.0)
Platelets: 68 K/uL — ABNORMAL LOW (ref 150–400)
RBC: 5.53 MIL/uL — ABNORMAL HIGH (ref 3.87–5.11)
RDW: 15.3 % (ref 11.5–15.5)
WBC: 7.4 K/uL (ref 4.0–10.5)
nRBC: 0 % (ref 0.0–0.2)

## 2024-05-23 LAB — GLUCOSE, CAPILLARY
Glucose-Capillary: 123 mg/dL — ABNORMAL HIGH (ref 70–99)
Glucose-Capillary: 140 mg/dL — ABNORMAL HIGH (ref 70–99)
Glucose-Capillary: 122 mg/dL — ABNORMAL HIGH (ref 70–99)
Glucose-Capillary: 91 mg/dL (ref 70–99)
Glucose-Capillary: 122 mg/dL — ABNORMAL HIGH (ref 70–99)

## 2024-05-23 LAB — BASIC METABOLIC PANEL WITH GFR
Anion gap: 16 — ABNORMAL HIGH (ref 5–15)
BUN: 46 mg/dL — ABNORMAL HIGH (ref 8–23)
CO2: 20 mmol/L — ABNORMAL LOW (ref 22–32)
Calcium: 7.5 mg/dL — ABNORMAL LOW (ref 8.9–10.3)
Chloride: 98 mmol/L (ref 98–111)
Creatinine, Ser: 2.01 mg/dL — ABNORMAL HIGH (ref 0.44–1.00)
GFR, Estimated: 24 mL/min — ABNORMAL LOW (ref >60)
Glucose, Bld: 114 mg/dL — ABNORMAL HIGH (ref 70–99)
Potassium: 3.2 mmol/L — ABNORMAL LOW (ref 3.5–5.1)
Sodium: 134 mmol/L — ABNORMAL LOW (ref 135–145)

## 2024-05-23 LAB — TROPONIN I (HIGH SENSITIVITY): Troponin I (High Sensitivity): 52 ng/L — ABNORMAL HIGH (ref <18)

## 2024-05-23 LAB — C DIFFICILE QUICK SCREEN W PCR REFLEX
C Diff antigen: NEGATIVE
C Diff interpretation: NOT DETECTED
C Diff toxin: NEGATIVE

## 2024-05-23 LAB — I-STAT CG4 LACTIC ACID, ED: Lactic Acid, Venous: 2.1 mmol/L (ref 0.5–1.9)

## 2024-05-23 LAB — MAGNESIUM: Magnesium: 2 mg/dL (ref 1.7–2.4)

## 2024-05-23 MED ORDER — INSULIN ASPART 100 UNIT/ML IJ SOLN
0.0000 [IU] | Freq: Three times a day (TID) | INTRAMUSCULAR | Status: DC
  Administered 2024-05-23 – 2024-05-29 (×9): 1 [IU] via SUBCUTANEOUS
  Administered 2024-05-29: 2 [IU] via SUBCUTANEOUS
  Administered 2024-05-30 – 2024-05-31 (×2): 1 [IU] via SUBCUTANEOUS
  Administered 2024-06-02: 2 [IU] via SUBCUTANEOUS
  Administered 2024-06-03 – 2024-06-05 (×6): 1 [IU] via SUBCUTANEOUS
  Administered 2024-06-05: 2 [IU] via SUBCUTANEOUS
  Administered 2024-06-05: 1 [IU] via SUBCUTANEOUS
  Filled 2024-05-23 (×10): qty 1
  Filled 2024-05-23 (×2): qty 2
  Filled 2024-05-23 (×3): qty 1
  Filled 2024-05-23: qty 2
  Filled 2024-05-23 (×3): qty 1
  Filled 2024-05-23: qty 2
  Filled 2024-05-23 (×2): qty 1

## 2024-05-23 MED ORDER — APIXABAN 5 MG PO TABS
5.0000 mg | ORAL_TABLET | Freq: Two times a day (BID) | ORAL | Status: DC
Start: 2024-05-23 — End: 2024-06-03
  Administered 2024-05-23 – 2024-06-02 (×22): 5 mg via ORAL
  Filled 2024-05-23 (×22): qty 1

## 2024-05-23 MED ORDER — POTASSIUM CHLORIDE CRYS ER 20 MEQ PO TBCR
40.0000 meq | EXTENDED_RELEASE_TABLET | Freq: Two times a day (BID) | ORAL | Status: DC
  Administered 2024-05-23 – 2024-05-25 (×6): 40 meq via ORAL
  Filled 2024-05-23 (×6): qty 2

## 2024-05-23 MED ORDER — SODIUM CHLORIDE 0.9 % IV BOLUS
250.0000 mL | Freq: Once | INTRAVENOUS | Status: AC
  Administered 2024-05-23: 250 mL via INTRAVENOUS

## 2024-05-23 MED ORDER — LORAZEPAM 0.5 MG PO TABS
0.5000 mg | ORAL_TABLET | Freq: Every day | ORAL | Status: DC
  Administered 2024-05-23 – 2024-05-26 (×4): 0.5 mg via ORAL
  Filled 2024-05-23 (×4): qty 1

## 2024-05-23 MED ORDER — INSULIN ASPART 100 UNIT/ML IJ SOLN: 0.0000 [IU] | Freq: Every day | INTRAMUSCULAR | Status: DC

## 2024-05-23 MED ORDER — AMIODARONE HCL 200 MG PO TABS
200.0000 mg | ORAL_TABLET | Freq: Every day | ORAL | Status: DC
Start: 2024-05-23 — End: 2024-06-06
  Administered 2024-05-23 – 2024-06-05 (×14): 200 mg via ORAL
  Filled 2024-05-23 (×14): qty 1

## 2024-05-23 MED ORDER — METOPROLOL SUCCINATE ER 25 MG PO TB24
25.0000 mg | ORAL_TABLET | Freq: Every day | ORAL | Status: DC
  Administered 2024-05-24 – 2024-05-27 (×4): 25 mg via ORAL
  Filled 2024-05-23 (×4): qty 1

## 2024-05-23 MED ORDER — POTASSIUM CHLORIDE 10 MEQ/100ML IV SOLN
10.0000 meq | INTRAVENOUS | Status: AC
  Administered 2024-05-23 (×3): 10 meq via INTRAVENOUS
  Filled 2024-05-23 (×3): qty 100

## 2024-05-23 MED ORDER — MEXILETINE HCL 150 MG PO CAPS
300.0000 mg | ORAL_CAPSULE | Freq: Two times a day (BID) | ORAL | Status: DC
  Administered 2024-05-23 – 2024-06-05 (×28): 300 mg via ORAL
  Filled 2024-05-23 (×30): qty 2

## 2024-05-23 MED ORDER — ONDANSETRON 4 MG PO TBDP: 4.0000 mg | ORAL_TABLET | Freq: Four times a day (QID) | ORAL | Status: DC | PRN

## 2024-05-23 MED ORDER — GABAPENTIN 300 MG PO CAPS
300.0000 mg | ORAL_CAPSULE | Freq: Two times a day (BID) | ORAL | Status: DC | PRN
  Administered 2024-05-24 – 2024-05-26 (×3): 300 mg via ORAL
  Filled 2024-05-23 (×3): qty 1

## 2024-05-23 MED ORDER — SODIUM CHLORIDE 0.9 % IV SOLN: INTRAVENOUS | Status: DC

## 2024-05-23 MED ORDER — SERTRALINE HCL 50 MG PO TABS
25.0000 mg | ORAL_TABLET | Freq: Every day | ORAL | Status: DC
  Administered 2024-05-23 – 2024-05-26 (×4): 25 mg via ORAL
  Filled 2024-05-23 (×5): qty 1

## 2024-05-23 MED ORDER — NYSTATIN 100000 UNIT/GM EX POWD
Freq: Two times a day (BID) | CUTANEOUS | Status: DC
  Filled 2024-05-23 (×4): qty 15

## 2024-05-23 MED ORDER — ACETAMINOPHEN 500 MG PO TABS
500.0000 mg | ORAL_TABLET | Freq: Four times a day (QID) | ORAL | Status: DC | PRN
  Filled 2024-05-23: qty 1

## 2024-05-23 MED ORDER — LEVOTHYROXINE SODIUM 25 MCG PO TABS
25.0000 ug | ORAL_TABLET | Freq: Every day | ORAL | Status: DC
  Administered 2024-05-23 – 2024-06-05 (×14): 25 ug via ORAL
  Filled 2024-05-23 (×15): qty 1

## 2024-05-23 MED ORDER — SODIUM CHLORIDE 0.9 % IV BOLUS
500.0000 mL | Freq: Once | INTRAVENOUS | Status: AC
  Administered 2024-05-23: 500 mL via INTRAVENOUS

## 2024-05-23 MED ORDER — FAMOTIDINE 20 MG PO TABS
20.0000 mg | ORAL_TABLET | Freq: Every day | ORAL | Status: DC
  Administered 2024-05-23 – 2024-06-02 (×11): 20 mg via ORAL
  Filled 2024-05-23 (×11): qty 1

## 2024-05-23 NOTE — Progress Notes (Signed)
   05/23/24 2235  BiPAP/CPAP/SIPAP  $ Non-Invasive Home Ventilator  Initial  $ Face Mask Medium Yes  BiPAP/CPAP/SIPAP Pt Type Adult  BiPAP/CPAP/SIPAP Resmed  Mask Type Full face mask  Dentures removed? Not applicable  Mask Size Medium  Set Rate 0 breaths/min  Respiratory Rate 14 breaths/min  EPAP 5 cmH2O  PEEP 5 cmH20  FiO2 (%) 21 %  Flow Rate 0 lpm  Minute Ventilation 8.1  Leak 0  Tidal Volume (Vt) 559  Patient Home Machine No  Patient Home Mask No  Patient Home Tubing No  Auto Titrate No  CPAP/SIPAP surface wiped down Yes  Device Plugged into RED Power Outlet Yes  BiPAP/CPAP /SiPAP Vitals  Pulse Rate 85  MEWS Score/Color  MEWS Score 1  MEWS Score Color Green

## 2024-05-23 NOTE — Plan of Care (Signed)
   Problem: Education: Goal: Ability to describe self-care measures that may prevent or decrease complications (Diabetes Survival Skills Education) will improve Outcome: Progressing Goal: Individualized Educational Video(s) Outcome: Progressing

## 2024-05-23 NOTE — H&P (Signed)
 History and Physical    SANIA NOY FMW:[id number] DOB: 1940-12-26 DOA: 05/22/2024  PCP: [name], DO   Patient coming from: Home  I have personally briefly reviewed patient's old medical records in [facility]  Chief Complaint: Vomiting, diarrhea  HPI: Heidi Stark is a 83 y.o. female with medical history significant for systolic and diastolic CHF, COPD, hypertension, paroxysmal atrial fibrillation, diabetes mellitus, DVT, OSA on CPAP. Patient was brought to the ED with complaints of nausea, vomiting and diarrhea.  Symptoms started yesterday with about 4 episodes of watery stools, and several episodes of vomiting today.  She has had poor oral intake for the past week and weakness.  Today she felt very dizzy, she got up, sat on the commode and passed out for a few seconds and came back to.  She did not fall she did not hit her head.  No urinary symptoms.  This was witnessed by her daughter who is currently at bedside.  She has been compliant with Lasix  40 mg daily and her antihypertensives.  Patient was in the ED 11/4 with nausea, workup was unremarkable.  CT abdomen without contrast was negative for acute abnormality.  She received IV fluid bolus, Zofran , and was discharged home.  ED Course: Blood pressure systolic 84-102.  Heart rate 78-101. Creatinine elevated 2.25.  BUN of 50.  Mild elevation AST 99. Troponin 54. Lactic acid 2.5>> 2.1 500 mL bolus given. Hospitalist admit for AKI, hypotension.  Review of Systems: As per HPI all other systems reviewed and negative.  Past Medical History:  Diagnosis Date   Arthritis    hands, wrists (11/07/2016)   CHF (congestive heart failure) (HCC)    Chronic lower back pain    Constipation    DVT (deep venous thrombosis) (HCC)    History of blood transfusion 1990   related to OR   History of kidney stones    Hyperlipidemia    Hypertension    Joint pain    Lower extremity edema    OSA on CPAP    Persistent  atrial fibrillation (HCC)    Pneumonia    several times (11/07/2016)   Type II diabetes mellitus (HCC)     Past Surgical History:  Procedure Laterality Date   ATRIAL FIBRILLATION ABLATION N/A 11/07/2016   Procedure: Atrial Fibrillation Ablation;  Surgeon: Will Gladis Norton, MD;  Location: [facility] INVASIVE CV LAB;  Service: Cardiovascular;  Laterality: N/A;   BACK SURGERY     CARDIAC CATHETERIZATION     CARDIOVERSION N/A 08/11/2016   Procedure: CARDIOVERSION;  Surgeon: Ezra GORMAN Shuck, MD;  Location: [facility] ENDOSCOPY;  Service: Cardiovascular;  Laterality: N/A;   CARDIOVERSION N/A 12/03/2017   Procedure: CARDIOVERSION;  Surgeon: Shuck Ezra GORMAN, MD;  Location: [facility] ENDOSCOPY;  Service: Cardiovascular;  Laterality: N/A;   CARDIOVERSION N/A 05/02/2018   Procedure: CARDIOVERSION;  Surgeon: Mona Vinie BROCKS, MD;  Location: [facility] ENDOSCOPY;  Service: Cardiovascular;  Laterality: N/A;   CARDIOVERSION N/A 06/23/2020   Procedure: CARDIOVERSION;  Surgeon: Hobart Powell BRAVO, MD;  Location: [facility] ENDOSCOPY;  Service: Cardiovascular;  Laterality: N/A;   CARDIOVERSION N/A 10/11/2020   Procedure: CARDIOVERSION;  Surgeon: Shuck Ezra GORMAN, MD;  Location: [facility] ENDOSCOPY;  Service: Cardiovascular;  Laterality: N/A;   CARPAL TUNNEL RELEASE     CATARACT EXTRACTION W/ INTRAOCULAR LENS  IMPLANT, BILATERAL Bilateral    COLON SURGERY  1990   vein graft and colon repair after nicked artery with back surgert   COLONOSCOPY  CORONARY ANGIOGRAPHY N/A 09/08/2020   Procedure: CORONARY ANGIOGRAPHY;  Surgeon: Rolan Ezra RAMAN, MD;  Location: [facility] INVASIVE CV LAB;  Service: Cardiovascular;  Laterality: N/A;   CYSTECTOMY     between bladder and kidneys   GANGLION CYST EXCISION Left    ICD IMPLANT N/A 09/15/2020   Procedure: ICD IMPLANT;  Surgeon: Inocencio Soyla Lunger, MD;  Location: [facility] INVASIVE CV LAB;  Service: Cardiovascular;  Laterality: N/A;   KNEE CARTILAGE SURGERY Right 1960s   LUMBAR DISC SURGERY  1990   REPAIR ILIAC  ARTERY  1990   vein graft and colon repair after nicked artery with back surgert   TEE WITHOUT CARDIOVERSION N/A 10/31/2013   Procedure: TRANSESOPHAGEAL ECHOCARDIOGRAM (TEE);  Surgeon: Ezra RAMAN Rolan, MD;  Location: [facility] ENDOSCOPY;  Service: Cardiovascular;  Laterality: N/A;   TUBAL LIGATION       reports that she quit smoking about 3 years ago. Her smoking use included cigarettes. She started smoking about 53 years ago. She has a 50 pack-year smoking history. She has never used smokeless tobacco. She reports that she does not drink alcohol and does not use drugs.  Allergies  Allergen Reactions   Neomycin-Bacitracin Zn-Polymyx Rash   Sulfa Antibiotics Diarrhea and Nausea And Vomiting   Ciprofloxacin  Hives, Itching and Rash    Pt doesn't remember having any reaction to cipro    Spironolactone  Rash    Family History  Problem Relation Age of Onset   Diabetes Mother    Hypertension Mother    Cancer Mother    Obesity Mother    Diabetes Father    Obesity Father    Diabetes Other    Melanoma Other    Factor V Leiden deficiency Daughter     Prior to Admission medications   Medication Sig Start Date End Date Taking? Authorizing Provider  acetaminophen  (TYLENOL ) 500 MG tablet Take 500 mg by mouth every 6 (six) hours as needed for moderate pain (pain score 4-6) or headache.    [provider]  amiodarone  (PACERONE ) 200 MG tablet TAKE 1 TABLET BY MOUTH EVERY DAY 08/23/23   Rolan Ezra RAMAN, MD  apixaban  (ELIQUIS ) 5 MG TABS tablet TAKE 1 TABLET BY MOUTH TWICE A DAY 03/06/24   Rolan Ezra RAMAN, MD  BD PEN NEEDLE NANO 2ND GEN 32G X 4 MM MISC See admin instructions. 09/24/21   [provider]  clotrimazole (LOTRIMIN) 1 % cream Apply to affected area 2 times daily 05/21/24   Beverley Doffing A, PA-C  dapagliflozin  propanediol (FARXIGA ) 10 MG TABS tablet TAKE 1 TABLET BY MOUTH EVERY DAY BEFORE BREAKFAST 04/23/24   Rolan Ezra RAMAN, MD  ENTRESTO  24-26 MG TAKE 1 TABLET BY MOUTH TWICE A DAY  04/01/24   Rolan Ezra RAMAN, MD  eplerenone  (INSPRA ) 25 MG tablet Take 1 tablet (25 mg total) by mouth daily. 03/20/24   Rolan Ezra RAMAN, MD  famotidine  (PEPCID ) 20 MG tablet Take 1 tablet (20 mg total) by mouth daily. 03/10/24   Antonio Cyndee Rockers R, DO  fenofibrate  (TRICOR ) 145 MG tablet TAKE 1 TABLET BY MOUTH EVERY DAY 01/22/23   Rolan Ezra RAMAN, MD  furosemide  (LASIX ) 40 MG tablet Take 40 mg by mouth 2 (two) times daily. Take 2 tablets by mouth twice daily 11/15/23   [provider]  gabapentin  (NEURONTIN ) 300 MG capsule Take 300 mg by mouth 2 (two) times daily as needed (pain). 07/31/22   [provider]  HUMALOG  MIX 75/25 KWIKPEN (75-25) 100 UNIT/ML KwikPen Take 10 units  with morning meal and 10 units with evening meal Patient taking differently: Inject 0-30 Units into the skin daily. If BS is  100 or < = No Insulin  100 - 140 = 10 Units Over 140 = 30 Units in the Morning and 20 Units at Bedtime 12/27/23   Rojelio Nest, DO  levothyroxine  (SYNTHROID ) 25 MCG tablet Take 25 mcg by mouth as directed. 1 Tablet(s) By Mouth 4 Times a Week 04/06/24   [provider]  LIVALO  4 MG TABS TAKE 1 TABLET BY MOUTH EVERY DAY 03/06/24   McLean, Dalton S, MD  LORazepam  (ATIVAN ) 0.5 MG tablet Take 1 tablet (0.5 mg total) by mouth at bedtime. 03/10/24   Lowne Chase, Yvonne R, DO  metoprolol  succinate (TOPROL -XL) 25 MG 24 hr tablet TAKE 1 TABLET BY MOUTH EVERYDAY AT BEDTIME Patient taking differently: Take 25 mg by mouth daily. 04/23/23   Hayes Beckey CROME, NP  mexiletine (MEXITIL ) 150 MG capsule TAKE 2 CAPSULES (300 MG TOTAL) BY MOUTH 2 (TWO) TIMES DAILY. 05/10/23   Camnitz, Soyla Lunger, MD  MOUNJARO 2.5 MG/0.5ML Pen Inject 2.5 mg into the skin once a week. Thursdays    [provider]  Multiple Vitamin (MULTIVITAMIN WITH MINERALS) TABS tablet Take 1 tablet by mouth daily.    [provider]  ondansetron  (ZOFRAN ) 8 MG tablet Take 1 tablet (8 mg total) by mouth every 8 (eight) hours  as needed for nausea or vomiting. 05/02/24   [name], DO  ondansetron  (ZOFRAN -ODT) 4 MG disintegrating tablet Take 1 tablet (4 mg total) by mouth every 6 (six) hours as needed for nausea or vomiting. 05/21/24   Prosperi, Christian H, PA-C  ONETOUCH VERIO test strip 1 each 3 (three) times daily. 10/09/21   [provider]  potassium chloride  (MICRO-K ) 10 MEQ CR capsule TAKE 1 CAPSULE BY MOUTH 2 TIMES DAILY. 05/12/24   Bensimhon, Toribio JONELLE, MD  sertraline  (ZOLOFT ) 25 MG tablet Take 1 tablet (25 mg total) by mouth daily. 03/10/24   Lowne Chase, Yvonne R, DO  traMADol (ULTRAM) 50 MG tablet Take 50 mg by mouth every 8 (eight) hours as needed. 02/14/24   [provider]  VASCEPA  1 g capsule TAKE 2 CAPSULES BY MOUTH TWICE A DAY 10/10/23   Rolan Ezra RAMAN, MD    Physical Exam: Vitals:   05/22/24 2230 05/22/24 2258 05/22/24 2300 05/23/24 0030  BP: (!) 88/58 (!) 96/56 (!) 102/49 99/87  Pulse: 89 90 78 (!) 101  Resp: 20 16 18 17   Temp:      TempSrc:      SpO2: 94% 96% 95% 96%  Weight:      Height:        Constitutional: NAD, calm, comfortable Vitals:   05/22/24 2230 05/22/24 2258 05/22/24 2300 05/23/24 0030  BP: (!) 88/58 (!) 96/56 (!) 102/49 99/87  Pulse: 89 90 78 (!) 101  Resp: 20 16 18 17   Temp:      TempSrc:      SpO2: 94% 96% 95% 96%  Weight:      Height:       Eyes: PERRL, lids and conjunctivae normal ENMT: Mucous membranes are moist.  Neck: normal, supple, no masses, no thyromegaly Respiratory: clear to auscultation bilaterally, no wheezing, no crackles. Normal respiratory effort. No accessory muscle use.  Cardiovascular: Regular rate and rhythm, no murmurs / rubs / gallops. No extremity edema.  Extremities warm. Abdomen: no tenderness, no masses palpated. No hepatosplenomegaly. Bowel sounds positive.  Musculoskeletal:  no clubbing / cyanosis. No joint deformity upper and lower extremities.  Skin: no rashes, lesions, ulcers. No induration Neurologic: No  facial asymmetry, 4+/5 strength of bilateral lower extremity, good and equal grip strength.  Psychiatric: Normal judgment and insight. Alert and oriented x 3. Normal mood.   Labs on Admission: I have personally reviewed following labs and imaging studies  CBC: Recent Labs  Lab 05/20/24 1932 05/22/24 2133  WBC 7.3 8.3  NEUTROABS  --  4.8  HGB 17.9* 17.8*  HCT 55.5* 52.2*  MCV 89.5 87.7  PLT 116* 71*   Basic Metabolic Panel: Recent Labs  Lab 05/21/24 0050 05/22/24 2133  NA 139 134*  K 3.7 3.7  CL 100 96*  CO2 25 22  GLUCOSE 110* 121*  BUN 35* 50*  CREATININE 1.45* 2.25*  CALCIUM  8.0* 7.8*   GFR: Estimated Creatinine Clearance: 24.6 mL/min (A) (by C-G formula based on SCr of 2.25 mg/dL (H)). Liver Function Tests: Recent Labs  Lab 05/21/24 0050 05/22/24 2133  AST 138* 99*  ALT 55* 43  ALKPHOS 69 55  BILITOT 1.2 2.0*  PROT 6.5 6.4*  ALBUMIN 3.2* 2.9*   Recent Labs  Lab 05/21/24 0050 05/22/24 2133  LIPASE 31 34   CBG: Recent Labs  Lab 05/22/24 2227  GLUCAP 130*   Urine analysis:    Component Value Date/Time   COLORURINE YELLOW 12/18/2023 0812   APPEARANCEUR CLEAR 12/18/2023 0812   LABSPEC 1.014 12/18/2023 0812   PHURINE 5.0 12/18/2023 0812   GLUCOSEU >=500 (A) 12/18/2023 0812   GLUCOSEU Negative 11/30/2011 0804   HGBUR NEGATIVE 12/18/2023 0812   BILIRUBINUR NEGATIVE 12/18/2023 0812   BILIRUBINUR neg 05/18/2016 0932   KETONESUR NEGATIVE 12/18/2023 0812   PROTEINUR NEGATIVE 12/18/2023 0812   UROBILINOGEN negative 05/18/2016 0932   UROBILINOGEN 1.0 11/23/2014 2250   NITRITE NEGATIVE 12/18/2023 0812   LEUKOCYTESUR NEGATIVE 12/18/2023 0812    Radiological Exams on Admission: DG Chest Portable 1 View Result Date: 05/22/2024 CLINICAL DATA:  Chest pain hypotension EXAM: PORTABLE CHEST 1 VIEW COMPARISON:  02/29/2024 FINDINGS: Left-sided single lead pacing device. Cardiomegaly without focal opacity, pleural effusion or pneumothorax. Diffuse bronchitic  changes, increased compared to prior. IMPRESSION: Cardiomegaly with diffuse bronchitic changes. Electronically Signed   By: Luke Bun M.D.   On: 05/22/2024 22:26   CT ABDOMEN PELVIS WO CONTRAST Result Date: 05/21/2024 EXAM: CT ABDOMEN AND PELVIS WITHOUT CONTRAST 05/21/2024 03:29:00 AM TECHNIQUE: CT of the abdomen and pelvis was performed without the administration of intravenous contrast. Multiplanar reformatted images are provided for review. Automated exposure control, iterative reconstruction, and/or weight-based adjustment of the mA/kV was utilized to reduce the radiation dose to as low as reasonably achievable. COMPARISON: CT abdomen and pelvis 05/12/2023 and earlier. CLINICAL HISTORY: 83 year old female. Abdominal pain, acute, nonlocalized. FINDINGS: LOWER CHEST: Stable cardiomegaly. Partially visible cardiac pacemaker lead. Calcified aortic atherosclerosis. No pericardial or pleural effusion. Chronic elevation of the left hemidiaphragm. Lung base ventilation improved from last year with some chronic lung base scarring. LIVER: Chronic hepatomegaly, liver length 25 cm. GALLBLADDER AND BILE DUCTS: Similar gallbladder distention to the CT last year. Chronic 6 mm gallstone. No regional inflammation. No bile duct dilatation. SPLEEN: Diminutive spleen. PANCREAS: No acute abnormality. ADRENAL GLANDS: Chronic asymmetric adrenal thickening greater on the left, suggesting adrenal hyperplasia. KIDNEYS, URETERS AND BLADDER: Non-contrast kidneys appear stable and nonobstructed. Urinary bladder distended similar to the CT last year. Vascular calcifications in the pelvis. No urinary calculus identified. No stones in the kidneys or ureters. No  hydronephrosis. No perinephric or periureteral stranding. GI AND BOWEL: Redundant gas containing sigmoid colon in the left abdomen, but decompressed upstream and downstream large bowel. Chronic right periumbilical ventral abdominal hernia contains a short segment of the  transverse colon as before on series 2 image 48; no evidence of acute incarceration. Upstream right colon decompressed with some diverticulosis. Normal gas containing appendix on series 2 image 57. Nondilated small bowel and stomach. No free air or free fluid. PERITONEUM AND RETROPERITONEUM: No ascites. No free air. VASCULATURE: Aorta is normal in caliber. Vascular calcifications in the pelvis. LYMPH NODES: No lymphadenopathy. REPRODUCTIVE ORGANS: Negative non-contrast uterus and adnexa. BONES AND SOFT TISSUES: Diffuse severe spine degeneration. Widespread vacuum disc and severe disc space loss. Lower thoracic hyperostosis with some chronic interbody ankylosis. Chronic osteopenia. L1 inferior endplate compression fracture is stable from last year. Similar inferior endplate compression fracture of T12 is new. T12 vertebral body height now reduced to 15.2 mm versus 22.5 mm normally, representing 32% loss of vertebral body height. No significant bone retropulsion. No focal soft tissue abnormality. IMPRESSION: 1. No acute or inflammatory process identified in the non-contrast abdomen or pelvis. 2. New T12 inferior endplate compression fracture since October 2024, with 32% loss of height. No complicating features. Stable chronic L1 compression fracture. 3. Chronic cholelithiasis, small right periumbilical hernia - containing a short segment of transverse colon, and Aortic atherosclerosis. Electronically signed by: Helayne Hurst MD 05/21/2024 03:58 AM EST RP Workstation: [id number]    EKG: Independently reviewed.  Atrial fibrillation rate 91, RBBB.  Assessment/Plan Principal Problem:   AKI (acute kidney injury) Active Problems:   CKD (chronic kidney disease) stage 3, GFR 30-59 ml/min (HCC)   COPD (chronic obstructive pulmonary disease) (HCC)   Paroxysmal atrial fibrillation (HCC)   Obstructive sleep apnea   Type 2 diabetes mellitus with hyperlipidemia (HCC)   Essential hypertension   Hypothyroidism    Encounter for assessment for deep vein thrombosis (DVT)   Diabetic nephropathy (HCC)   Assessment and Plan:  AKI on CKD 3a, hypotension, syncope-creatinine 2.25, baseline 0.8-1.4.  Hypotension, blood pressure down to 84/52.  Likely due to GI losses vomiting diarrhea, poor oral intake.  She is also on Lasix  40 mg daily - 1 L bolus given, continue N/s 75cc/hr x 12hrs - Hold Lasix , Entresto , metoprolol , eplerenone   Vomiting, diarrhea-gastroenteritis type symptoms.  No abdominal pain.  Afebrile.  No leukocytosis.  WBC 8.3.  Recent CT AP Wo C, 11/5-no acute abnormality.  Deferred repeat imaging. - Stool C. difficile, GI pathogen panel  New T12 compression fracture-on CT 11/5- New T12 inferior endplate compression fracture since October 2024, with 32% loss of height. No complicating features. Stable chronic L1 compression fracture.  Chronic systolic and diastolic CHF stable and compensated.  Last echo 12/2023 EF of 70 to 75%. - Hold Entresto , metoprolol , Lasix   COPD - Stable  Diabetes mellitus-A1c 6.2 - SSI- S - Hold Mounjaro, Farxiga   Hypothyroid - Resume Synthroid   Atrial fibrillation-rate controlled on metoprolol  and amiodarone  EKG shows A-fib, on anticoagulation with Eliquis .  - Hold metoprolol  for now with hypotension. - Resume amiodarone , Eliquis   DVT prophylaxis: Eliquis  Code Status: DNR, confirmed patient and daughter [name] who is POA at bedside  Family Communication: Daughter Renee at bedside Disposition Plan: ~ 2 days Consults called: none Admission status:  obs tele   Author: Tully FORBES Carwin, MD 05/23/2024 2:37 AM  For on call review [email or web address].

## 2024-05-23 NOTE — Care Management Obs Status (Signed)
 MEDICARE OBSERVATION STATUS NOTIFICATION   Patient Details  Name: Heidi Stark MRN: [id number] Date of Birth: 09-May-1941   Medicare Observation Status Notification Given:  Yes verbally reviewed observation notice with [facility] telephonically at [phone].  Will mail a copy to the patients home address.      Avynn Klassen 05/23/2024, 3:25 PM

## 2024-05-23 NOTE — Telephone Encounter (Signed)
 Pt admitted

## 2024-05-23 NOTE — Consult Note (Addendum)
 ELECTROPHYSIOLOGY CONSULT NOTE    Patient ID: Heidi Stark MRN: [id number], DOB/AGE: 1941/02/18 83 y.o.  Admit date: 05/22/2024 Date of Consult: 05/23/2024  Primary Physician: Antonio Meth, Jamee SAUNDERS, DO Primary Cardiologist: Ezra Shuck, MD  Electrophysiologist: Dr. Inocencio   Referring Provider: @ATTENDING @  Patient Profile: Heidi Stark is a 82 y.o. female with a history of VT s/p ICD, CHF, COPD, perm AFib, CKD -3b, hypothyroid,T2DM, DVT, OSA on CPAP, PAD, former smoker,  who is being seen today for the evaluation of persis VT at the request of Dr. Royal.  HPI:  Heidi Stark is a 83 y.o. female with PMH as above who is well-known to EP team. She has known RV lead fracture and is planned for RV lead reimplantation on 11/20 with Dr. Inocencio. Home meds include 300mg  mexiletine BID, 200mg  amio daily, 25mg  toprol .    She presented to ED 11/6 with c/o N/V/D. She has had poor oral intake for about 1 week with vomiting and diarrhea starting yesterday.  She had continued to take anti-HTN medications and lasix  at home.   In ER, BP soft 80-100s systolic, Cr elevated to 2.25 (baseline appears 0.8-1.1), trop 54, Lactic acid 2.5 (has since improved to 2.1).   She was given IVF bolus.   She had several runs of NSVT this morning, and one run of ~68minute VT, which prompted EP consult. During episode this AM, she was asymptomatic, denies chest pain, chest pressure, palpitations.  She currently feels unwell, no appetite with ongoing nausea. She has not had emesis today.   She did miss PM medications yesterday including amiodarone , mexiletine.   Labs Potassium3.2* (11/07 9395) Magnesium   2.0 (11/07 0114) Creatinine, ser  2.01* (11/07 0604) PLT  68* (11/07 0604) HGB  16.5* (11/07 0604) WBC 7.4 (11/07 0604) Troponin I (High Sensitivity)52* (11/07 0114).    Past Medical History:  Diagnosis Date   Arthritis    hands, wrists (11/07/2016)   CHF (congestive heart failure) (HCC)     Chronic lower back pain    Constipation    DVT (deep venous thrombosis) (HCC)    History of blood transfusion 1990   related to OR   History of kidney stones    Hyperlipidemia    Hypertension    Joint pain    Lower extremity edema    OSA on CPAP    Persistent atrial fibrillation (HCC)    Pneumonia    several times (11/07/2016)   Type II diabetes mellitus (HCC)      Surgical History:  Past Surgical History:  Procedure Laterality Date   ATRIAL FIBRILLATION ABLATION N/A 11/07/2016   Procedure: Atrial Fibrillation Ablation;  Surgeon: Will Gladis Inocencio, MD;  Location: [facility] INVASIVE CV LAB;  Service: Cardiovascular;  Laterality: N/A;   BACK SURGERY     CARDIAC CATHETERIZATION     CARDIOVERSION N/A 08/11/2016   Procedure: CARDIOVERSION;  Surgeon: Ezra GORMAN Shuck, MD;  Location: [facility] ENDOSCOPY;  Service: Cardiovascular;  Laterality: N/A;   CARDIOVERSION N/A 12/03/2017   Procedure: CARDIOVERSION;  Surgeon: Shuck Ezra GORMAN, MD;  Location: [facility] ENDOSCOPY;  Service: Cardiovascular;  Laterality: N/A;   CARDIOVERSION N/A 05/02/2018   Procedure: CARDIOVERSION;  Surgeon: Mona Vinie BROCKS, MD;  Location: [facility] ENDOSCOPY;  Service: Cardiovascular;  Laterality: N/A;   CARDIOVERSION N/A 06/23/2020   Procedure: CARDIOVERSION;  Surgeon: Hobart Powell BRAVO, MD;  Location: [facility] ENDOSCOPY;  Service: Cardiovascular;  Laterality: N/A;   CARDIOVERSION N/A 10/11/2020   Procedure: CARDIOVERSION;  Surgeon: Shuck Ezra  S, MD;  Location: [facility] ENDOSCOPY;  Service: Cardiovascular;  Laterality: N/A;   CARPAL TUNNEL RELEASE     CATARACT EXTRACTION W/ INTRAOCULAR LENS  IMPLANT, BILATERAL Bilateral    COLON SURGERY  1990   vein graft and colon repair after nicked artery with back surgert   COLONOSCOPY     CORONARY ANGIOGRAPHY N/A 09/08/2020   Procedure: CORONARY ANGIOGRAPHY;  Surgeon: Rolan Ezra RAMAN, MD;  Location: [facility] INVASIVE CV LAB;  Service: Cardiovascular;  Laterality: N/A;   CYSTECTOMY     between bladder  and kidneys   GANGLION CYST EXCISION Left    ICD IMPLANT N/A 09/15/2020   Procedure: ICD IMPLANT;  Surgeon: Inocencio Soyla Lunger, MD;  Location: [facility] INVASIVE CV LAB;  Service: Cardiovascular;  Laterality: N/A;   KNEE CARTILAGE SURGERY Right 1960s   LUMBAR DISC SURGERY  1990   REPAIR ILIAC ARTERY  1990   vein graft and colon repair after nicked artery with back surgert   TEE WITHOUT CARDIOVERSION N/A 10/31/2013   Procedure: TRANSESOPHAGEAL ECHOCARDIOGRAM (TEE);  Surgeon: Ezra RAMAN Rolan, MD;  Location: [facility] ENDOSCOPY;  Service: Cardiovascular;  Laterality: N/A;   TUBAL LIGATION       Medications Prior to Admission  Medication Sig Dispense Refill Last Dose/Taking   amiodarone  (PACERONE ) 200 MG tablet TAKE 1 TABLET BY MOUTH EVERY DAY 90 tablet 3 05/22/2024 Evening   apixaban  (ELIQUIS ) 5 MG TABS tablet TAKE 1 TABLET BY MOUTH TWICE A DAY 180 tablet 1 05/22/2024 at  9:00 AM   clotrimazole (LOTRIMIN) 1 % cream Apply to affected area 2 times daily 15 g 0 05/22/2024 Morning   dapagliflozin  propanediol (FARXIGA ) 10 MG TABS tablet TAKE 1 TABLET BY MOUTH EVERY DAY BEFORE BREAKFAST 90 tablet 3 05/22/2024 Morning   ENTRESTO  24-26 MG TAKE 1 TABLET BY MOUTH TWICE A DAY 180 tablet 8 05/22/2024 Morning   eplerenone  (INSPRA ) 25 MG tablet Take 1 tablet (25 mg total) by mouth daily. 30 tablet 11 05/22/2024 Morning   famotidine  (PEPCID ) 20 MG tablet Take 1 tablet (20 mg total) by mouth daily. 90 tablet 0 05/22/2024 Morning   fenofibrate  (TRICOR ) 145 MG tablet TAKE 1 TABLET BY MOUTH EVERY DAY 90 tablet 3 05/22/2024   furosemide  (LASIX ) 40 MG tablet Take 80 mg by mouth 2 (two) times daily.   05/22/2024 Morning   gabapentin  (NEURONTIN ) 300 MG capsule Take 300 mg by mouth 2 (two) times daily as needed (pain).   Past Week   HUMALOG  MIX 75/25 KWIKPEN (75-25) 100 UNIT/ML KwikPen Take 10 units with morning meal and 10 units with evening meal (Patient taking differently: Inject 0-30 Units into the skin daily. If BS is  100 or < = No  Insulin  100 - 140 = 10 Units Over 140 = 30 Units in the Morning and 20 Units at Bedtime)   Past Week   levothyroxine  (SYNTHROID ) 25 MCG tablet Take 25 mcg by mouth daily.   05/22/2024 Morning   LIVALO  4 MG TABS TAKE 1 TABLET BY MOUTH EVERY DAY 90 tablet 1 05/22/2024 Morning   LORazepam  (ATIVAN ) 0.5 MG tablet Take 1 tablet (0.5 mg total) by mouth at bedtime. 30 tablet 1 05/21/2024   metoprolol  succinate (TOPROL -XL) 25 MG 24 hr tablet TAKE 1 TABLET BY MOUTH EVERYDAY AT BEDTIME (Patient taking differently: Take 25 mg by mouth daily.) 90 tablet 3 05/22/2024 Morning   mexiletine (MEXITIL ) 150 MG capsule TAKE 2 CAPSULES (300 MG TOTAL) BY MOUTH 2 (TWO) TIMES DAILY. 360 capsule 3 05/22/2024 Morning  MOUNJARO 2.5 MG/0.5ML Pen Inject 2.5 mg into the skin once a week. Thursdays   05/15/2024   Multiple Vitamin (MULTIVITAMIN WITH MINERALS) TABS tablet Take 1 tablet by mouth daily.   05/22/2024 Morning   ondansetron  (ZOFRAN -ODT) 4 MG disintegrating tablet Take 1 tablet (4 mg total) by mouth every 6 (six) hours as needed for nausea or vomiting. 20 tablet 0 05/22/2024 Evening   potassium chloride  (MICRO-K ) 10 MEQ CR capsule TAKE 1 CAPSULE BY MOUTH 2 TIMES DAILY. 60 capsule 3 05/22/2024 Morning   sertraline  (ZOLOFT ) 25 MG tablet Take 1 tablet (25 mg total) by mouth daily. 90 tablet 2 05/22/2024 Morning   VASCEPA  1 g capsule TAKE 2 CAPSULES BY MOUTH TWICE A DAY 120 capsule 5 05/22/2024 Morning   acetaminophen  (TYLENOL ) 500 MG tablet Take 500 mg by mouth every 6 (six) hours as needed for moderate pain (pain score 4-6) or headache.      BD PEN NEEDLE NANO 2ND GEN 32G X 4 MM MISC See admin instructions.      ondansetron  (ZOFRAN ) 8 MG tablet Take 1 tablet (8 mg total) by mouth every 8 (eight) hours as needed for nausea or vomiting. (Patient not taking: Reported on 05/23/2024) 30 tablet 0 Not Taking   ONETOUCH VERIO test strip 1 each 3 (three) times daily.       Inpatient Medications:   amiodarone   200 mg Oral Daily   apixaban    5 mg Oral BID   famotidine   20 mg Oral Daily   insulin  aspart  0-5 Units Subcutaneous QHS   insulin  aspart  0-9 Units Subcutaneous TID WC   levothyroxine   25 mcg Oral Daily   LORazepam   0.5 mg Oral QHS   metoprolol  succinate  25 mg Oral Daily   mexiletine  300 mg Oral BID   potassium chloride   40 mEq Oral BID   sertraline   25 mg Oral Daily    Allergies:  Allergies  Allergen Reactions   Neomycin-Bacitracin Zn-Polymyx Rash   Sulfa Antibiotics Diarrhea and Nausea And Vomiting   Ciprofloxacin  Hives, Itching and Rash    Pt doesn't remember having any reaction to cipro    Spironolactone  Rash    Family History  Problem Relation Age of Onset   Diabetes Mother    Hypertension Mother    Cancer Mother    Obesity Mother    Diabetes Father    Obesity Father    Diabetes Other    Melanoma Other    Factor V Leiden deficiency Daughter      Physical Exam: Vitals:   05/23/24 0230 05/23/24 0300 05/23/24 0453 05/23/24 0825  BP: 110/75 110/68 105/63 109/61  Pulse: 94 94 83 91  Resp: 17 13 19 16   Temp:   98.6 F (37 C) 97.6 F (36.4 C)  TempSrc:      SpO2: 97% 96% 97% 98%  Weight:      Height:        GEN- ill-appearing, A&O HEENT: Normocephalic, atraumatic Lungs- CTAB, Normal effort.  Heart- Regular rate and rhythm, No M/G/R.  GI- Soft, NT, ND.  Extremities- No clubbing, cyanosis, or edema   Radiology/Studies: DG Chest Portable 1 View Result Date: 05/22/2024 CLINICAL DATA:  Chest pain hypotension EXAM: PORTABLE CHEST 1 VIEW COMPARISON:  02/29/2024 FINDINGS: Left-sided single lead pacing device. Cardiomegaly without focal opacity, pleural effusion or pneumothorax. Diffuse bronchitic changes, increased compared to prior. IMPRESSION: Cardiomegaly with diffuse bronchitic changes. Electronically Signed   By: Luke Bun M.D.   On: 05/22/2024  22:26   CT ABDOMEN PELVIS WO CONTRAST Result Date: 05/21/2024 EXAM: CT ABDOMEN AND PELVIS WITHOUT CONTRAST 05/21/2024 03:29:00 AM TECHNIQUE:  CT of the abdomen and pelvis was performed without the administration of intravenous contrast. Multiplanar reformatted images are provided for review. Automated exposure control, iterative reconstruction, and/or weight-based adjustment of the mA/kV was utilized to reduce the radiation dose to as low as reasonably achievable. COMPARISON: CT abdomen and pelvis 05/12/2023 and earlier. CLINICAL HISTORY: 83 year old female. Abdominal pain, acute, nonlocalized. FINDINGS: LOWER CHEST: Stable cardiomegaly. Partially visible cardiac pacemaker lead. Calcified aortic atherosclerosis. No pericardial or pleural effusion. Chronic elevation of the left hemidiaphragm. Lung base ventilation improved from last year with some chronic lung base scarring. LIVER: Chronic hepatomegaly, liver length 25 cm. GALLBLADDER AND BILE DUCTS: Similar gallbladder distention to the CT last year. Chronic 6 mm gallstone. No regional inflammation. No bile duct dilatation. SPLEEN: Diminutive spleen. PANCREAS: No acute abnormality. ADRENAL GLANDS: Chronic asymmetric adrenal thickening greater on the left, suggesting adrenal hyperplasia. KIDNEYS, URETERS AND BLADDER: Non-contrast kidneys appear stable and nonobstructed. Urinary bladder distended similar to the CT last year. Vascular calcifications in the pelvis. No urinary calculus identified. No stones in the kidneys or ureters. No hydronephrosis. No perinephric or periureteral stranding. GI AND BOWEL: Redundant gas containing sigmoid colon in the left abdomen, but decompressed upstream and downstream large bowel. Chronic right periumbilical ventral abdominal hernia contains a short segment of the transverse colon as before on series 2 image 48; no evidence of acute incarceration. Upstream right colon decompressed with some diverticulosis. Normal gas containing appendix on series 2 image 57. Nondilated small bowel and stomach. No free air or free fluid. PERITONEUM AND RETROPERITONEUM: No ascites. No free  air. VASCULATURE: Aorta is normal in caliber. Vascular calcifications in the pelvis. LYMPH NODES: No lymphadenopathy. REPRODUCTIVE ORGANS: Negative non-contrast uterus and adnexa. BONES AND SOFT TISSUES: Diffuse severe spine degeneration. Widespread vacuum disc and severe disc space loss. Lower thoracic hyperostosis with some chronic interbody ankylosis. Chronic osteopenia. L1 inferior endplate compression fracture is stable from last year. Similar inferior endplate compression fracture of T12 is new. T12 vertebral body height now reduced to 15.2 mm versus 22.5 mm normally, representing 32% loss of vertebral body height. No significant bone retropulsion. No focal soft tissue abnormality. IMPRESSION: 1. No acute or inflammatory process identified in the non-contrast abdomen or pelvis. 2. New T12 inferior endplate compression fracture since October 2024, with 32% loss of height. No complicating features. Stable chronic L1 compression fracture. 3. Chronic cholelithiasis, small right periumbilical hernia - containing a short segment of transverse colon, and Aortic atherosclerosis. Electronically signed by: Helayne Hurst MD 05/21/2024 03:58 AM EST RP Workstation: [id number]   DG Foot Complete Left Result Date: 05/09/2024 EXAM: 3 OR MORE VIEW(S) XRAY OF THE LEFT FOOT 05/07/2024 02:55:00 PM COMPARISON: 11/08/2023 CLINICAL HISTORY: pressure sore L foot. C/o of waking up on 10/10 w/blister on lt lat ankle. Pain. FINDINGS: BONES AND JOINTS: No acute fracture. No focal osseous lesion. No joint dislocation. Small calcaneal spurs. Mild midfoot and first MTP joint osteoarthritis. SOFT TISSUES: Vascular calcifications. The soft tissues are unremarkable. IMPRESSION: 1. No acute osseous abnormality. 2. Mild midfoot and first MTP joint osteoarthritis. 3. Small calcaneal spurs. 4. Vascular calcifications. Electronically signed by: Dayne Hassell MD 05/09/2024 03:14 PM EDT RP Workstation: [id number]   CUP PACEART INCLINIC DEVICE  CHECK Result Date: 04/24/2024 Normal in-clinic single chamber ICD check. Presenting Rhythm: VS-57 . Routine testing was performed. Thresholds and sensing demonstrate stable parameters and no  programming changes needed. No treated arrhythmias. AT/AF burden 94.8%- persistent AF since mid May 2025- on OAC.  Estimated longevity 8.6 years. Pt enrolled in remote follow-up. Impendence has been steadily trending up, but currently stable. Tested- RV tip-ring: 2755 Ohms RV tip-coil: 2600 Ohms Patient has been scheduled for a RV lead revision on 06/05/24.Powell Level, BSN, RN   EKG:05/22/2024 at 2113 - AFib w RBBB, rate 91;  (personally reviewed)  TELEMETRY: afib with frequent NSVT, ~940 1 minute of VT  ~150bpm (personally reviewed)  DEVICE HISTORY:  MDT single chamber ICD, imp 2022 for VT Known elevated RV impedence Afib 94% Current programming - VF 222 bpm VT 176 bpm Monitor 133bpm    Assessment/Plan: #) VT #) ICD in situ #) N/V/D #) hypokalemia #) hypotension  Patient [facility] ER with nausea, vomiting, diarrhea, weakness. She has had symptoms for ~1 week leading to reduced PO intake.  Lactic acid at admission 2.5, since reduced to 2.1 Most recent potassium 3.2, currently being repleted.  Given VT/NSVT episode this AM was in setting of missing amiodarone  and mexiletine, would favor continuing to monitor for recurrence She has known RV lead fracture and is planned for reimplantation of RV lead in the near future.  Keep K > 4, Mag > 2 Avoid QTC prolonging medications as possible       For questions or updates, please contact [facility] Please consult [email or web address] for contact info under          For questions or updates, please contact [facility] Please consult [email or web address] for contact info under Cardiology/STEMI.  Signed, Suzann Riddle, NP  05/23/2024 11:55 AM   I have seen, examined the patient, and reviewed the above assessment and plan.     Hospital Course: ANALILIA GEDDIS is a 83 y.o. female with a history of VT s/p ICD, CHF, COPD, perm AFib, CKD -3b, hypothyroid,T2DM, DVT, OSA on CPAP, PAD, former smoker who presented to the ED on 11/7 with a chief complaint of nausea, vomiting, diarrhea.  This been ongoing for several days.  This led to poor oral intake.  She developed significant fatigue.  She had a near syncopal episode.  Since admission, she has had episodes of ventricular tachycardia on telemetry.  Patient states that she is unaware of these episodes.  She denies any palpitations.  However, she does report feeling near syncopal at times, but is unclear whether these correlate with episodes of VT.  General: Chronically ill appearing, in no acute distress.  Neck: No JVD.  Cardiac: Normal rate, regular rhythm.  Resp: Normal work of breathing.  Ext: No edema.  Neuro: No gross focal deficits.  Psych: Normal affect.   Assessment: JOSANNA HEFEL is a 83 y.o. female with h/o chronic systolic heart failure with recovered LVEF, VT status post ICD now complicated by RV lead malfunction, hypertension, diabetes, atrial fibrillation who is currently admitted with a GI illness.  Her GI illness has been associated with decreased oral intake and electrolyte abnormalities.  GI illness also led to missing some oral medications.  She has had episodes of monomorphic VT in that setting.  Problem List:  # Monomorphic ventricular tachycardia #High risk medication use: Amiodarone  and mexiletine. - Resume home oral amiodarone  200 mg daily and mexiletine 300 mg twice daily.  She had missed some doses of acute GI illness. - Aggressive monitoring of electrolytes and repletion as necessary.  #S/p VVI ICD #RV lead malfunction -Episodes of VT fell below her treatment zone.  We will need to lower treatment zone to approximately 150 bpm prior to discharge. -She is scheduled for abandonment of her RV ICD lead implantation of new RV ICD lead with Dr.  Inocencio later this month.  If she remains in the hospital through the weekend, then Dr. Inocencio can see patient on Monday to reevaluate.   #Paroxysmal atrial fibrillation: #Hypercoagulable state due to atrial fibrillation: - Continue amiodarone  and metoprolol  as above. -Continue Eliquis  5 mg twice daily.    Fonda [name], MD 05/23/2024 9:05 PM

## 2024-05-23 NOTE — Progress Notes (Signed)
 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 Antony VVS HFrEF NYHA class III-->HF MR EF 60-65% in 2023 on GDMT CAD based on coronary CTA/cath however 2022 showing only luminal irregularities Smoker till 2022 OSA on CPAP CKD 3B Hypothyroidism followed by endocrinology  Admitted from [facility] ED nausea vomiting weakness + diarrhea in the setting of recent?  Prescription Mounjaro  Chronology  11/4-[facility] ED emergency room evaluation-volume depletion dehydration troponin 26 received IV fluid bolus Zofran  Eliquis  and subsequently because was tolerating p.o. went home-CT scan at the time no acute process 11/6 evaluated by PCP and promptly sent back to the ED-found to have volume depletion again bolus IV fluid 500 cc Reglan Benadryl etc. given for creatinine up from 1.4-->2.25-no chest pain despite troponin 54 A-fib on EKG--PPM to be interrogated    Pertinent imaging/studies till date  11/6 CXR 1 view cardiomegaly diffuse bronchitic changes   Assessment  & Plan :    ATN causing AKI superimposed on CKD 3B from nausea vomiting Kidney function is resolving slowly-received 2 boluses of fluid on arrival Holding Lasix  80 twice daily, Inspra  25 Entresto  twice daily at this time She does not seem volume depleted terribly I would hold further IV fluid at this time as well Not sure if Mounjaro was putative etiology-it looks like this was started in 2025 and I  would probably stop it going forward as this is a known side effect Aggressively replacing potassium given below Persistent VT in the setting of electrolyte abnormalities Resumed amiodarone  200 daily, metoprolol  XL 25 and Mexitil  300 twice daily Magnesium  is okay Have alerted EP regarding this patient-apparently was supposed to have RV lead revision as per last office note 04/24/2024 Further medication adjustment as per them N/V/D?  Diarrheal illness Diarrhea seems to have slowed down awaiting C. difficile/pathogen panel if needed HFmrEF EF improved recently based on June 2025 echo currently 70-75% History of VT/A-fib-on amiodarone  + Eliquis  See above Diuretics held reimplement slowly Underlying COPD-prior smoker till 2022 Not on any specific inhalers-monitor OSA on CPAP Can use nighttime settings-ordered for DM TY 2 recent A1c 6.2-at home Humalog  KwikPen 10 units twice daily and Farxiga  which we have held CBGs in house very well-controlled-continue sensitive sliding scale 3 times a day with meals at bedtime coverage Hypothyroidism Continue Synthroid  25 Thrombocytopenia probably related to diarrheal illness?  Baseline usually in 160s Monitor closely and trend to resolution     Data Reviewed today:  Sodium 134 potassium 3.2 BUN/creatinine 46/2.0 calcium  7.5 bicarb 20 Anion gap 16 WBC 7.4 hemoglobin 16.5 platelet 68   DVT prophylaxis: Eliquis   Status is: Observation Inpatient    Dispo/Global plan: Await resolution of symptoms etc.   Time 60   Subjective:   Awake coherent does not feel the SVT Several runs noted this morning but had not received her meds--- the last runs seems to have subsided after dosing her Amio and Mexitil  She has no current chest pain  She was able to eat a banana and take a potassium pill this morning--her daughter is at the bedside and gives an excellent history   Objective + exam Vitals:   05/23/24 0200 05/23/24 0230 05/23/24 0300 05/23/24 0453   BP: (!) 94/53 110/75 110/68 105/63  Pulse: 81 94 94 83  Resp: 13 17 13 19   Temp:    98.6 F (37 C)  TempSrc:      SpO2: 96% 97% 96% 97%  Weight:      Height:       Filed Weights   05/22/24 2118  Weight: 113.4 kg     Examination: EOMI NCAT thick neck Mallampati 4 Neck soft supple No icterus no pallor Cannot appreciate JVD S1-S2 no murmur Reviewed telemetry-predominantly in sinus right now up until about 940 this morning had VT Abdomen is obese nontender Trace lower extremity edema     Scheduled Meds:  amiodarone   200 mg Oral Daily   apixaban   5 mg Oral BID   insulin  aspart  0-5 Units Subcutaneous QHS   insulin  aspart  0-9 Units Subcutaneous TID WC   levothyroxine   25 mcg Oral Daily   LORazepam   0.5 mg Oral QHS   Continuous Infusions: gabapentin   Jai-Gurmukh Sacoya Mcgourty, MD  [facility]

## 2024-05-23 NOTE — ED Provider Notes (Signed)
 I have discussed the case with Dr. Pearlean of [facility], who agrees to admit the patient.   Raford Lenis, MD 05/23/24 [phone]

## 2024-05-24 ENCOUNTER — Inpatient Hospital Stay (HOSPITAL_COMMUNITY)

## 2024-05-24 DIAGNOSIS — N179 Acute kidney failure, unspecified: Secondary | ICD-10-CM | POA: Diagnosis not present

## 2024-05-24 LAB — GLUCOSE, CAPILLARY
Glucose-Capillary: 104 mg/dL — ABNORMAL HIGH (ref 70–99)
Glucose-Capillary: 140 mg/dL — ABNORMAL HIGH (ref 70–99)
Glucose-Capillary: 100 mg/dL — ABNORMAL HIGH (ref 70–99)
Glucose-Capillary: 103 mg/dL — ABNORMAL HIGH (ref 70–99)

## 2024-05-24 LAB — COMPREHENSIVE METABOLIC PANEL WITH GFR
ALT: 37 U/L (ref 0–44)
AST: 100 U/L — ABNORMAL HIGH (ref 15–41)
Albumin: 2.5 g/dL — ABNORMAL LOW (ref 3.5–5.0)
Alkaline Phosphatase: 46 U/L (ref 38–126)
Anion gap: 14 (ref 5–15)
BUN: 36 mg/dL — ABNORMAL HIGH (ref 8–23)
CO2: 19 mmol/L — ABNORMAL LOW (ref 22–32)
Calcium: 7.7 mg/dL — ABNORMAL LOW (ref 8.9–10.3)
Chloride: 103 mmol/L (ref 98–111)
Creatinine, Ser: 1.47 mg/dL — ABNORMAL HIGH (ref 0.44–1.00)
GFR, Estimated: 35 mL/min — ABNORMAL LOW (ref >60)
Glucose, Bld: 91 mg/dL (ref 70–99)
Potassium: 4.4 mmol/L (ref 3.5–5.1)
Sodium: 136 mmol/L (ref 135–145)
Total Bilirubin: 1.7 mg/dL — ABNORMAL HIGH (ref 0.0–1.2)
Total Protein: 5.6 g/dL — ABNORMAL LOW (ref 6.5–8.1)

## 2024-05-24 LAB — CBC WITH DIFFERENTIAL/PLATELET
Abs Immature Granulocytes: 0.02 K/uL (ref 0.00–0.07)
Basophils Absolute: 0 K/uL (ref 0.0–0.1)
Basophils Relative: 0 %
Eosinophils Absolute: 0 K/uL (ref 0.0–0.5)
Eosinophils Relative: 1 %
HCT: 48.9 % — ABNORMAL HIGH (ref 36.0–46.0)
Hemoglobin: 16.4 g/dL — ABNORMAL HIGH (ref 12.0–15.0)
Immature Granulocytes: 0 %
Lymphocytes Relative: 36 %
Lymphs Abs: 2.5 K/uL (ref 0.7–4.0)
MCH: 29.7 pg (ref 26.0–34.0)
MCHC: 33.5 g/dL (ref 30.0–36.0)
MCV: 88.6 fL (ref 80.0–100.0)
Monocytes Absolute: 0.8 K/uL (ref 0.1–1.0)
Monocytes Relative: 12 %
Neutro Abs: 3.7 K/uL (ref 1.7–7.7)
Neutrophils Relative %: 51 %
Platelets: 61 K/uL — ABNORMAL LOW (ref 150–400)
RBC: 5.52 MIL/uL — ABNORMAL HIGH (ref 3.87–5.11)
RDW: 15.7 % — ABNORMAL HIGH (ref 11.5–15.5)
WBC: 7.1 K/uL (ref 4.0–10.5)
nRBC: 0 % (ref 0.0–0.2)

## 2024-05-24 LAB — GASTROINTESTINAL PANEL BY PCR, STOOL (REPLACES STOOL CULTURE)

## 2024-05-24 LAB — T4, FREE: Free T4: 1.41 ng/dL — ABNORMAL HIGH (ref 0.61–1.12)

## 2024-05-24 LAB — TSH: TSH: 9.365 u[IU]/mL — ABNORMAL HIGH (ref 0.350–4.500)

## 2024-05-24 NOTE — Progress Notes (Signed)
 HOSPITAL MEDICINE OVERNIGHT EVENT NOTE    Notified by nursing that patient has been experiencing abdominal fullness and and inability to urinate.  Bladder scan revealing 479 cc.  Straight cath ordered.  CT imaging of the abdomen and pelvis reviewed from 11/05 revealed no obvious hydronephrosis at that time however there was a distended bladder on that scan noted.  Will obtain renal ultrasound and urinalysis.  Going forward, as needed bladder scans have been ordered if patient does not urinate for 6 hours or greater.   Zachary JINNY Ba  MD [facility]

## 2024-05-24 NOTE — Plan of Care (Addendum)
 Electrophysiology Plan of Care:   Interval: Reviewed telemetry. No further episodes of VT or NSVT.   Assessment: Heidi Stark is a 83 y.o. female with h/o chronic systolic heart failure with recovered LVEF, VT status post ICD now complicated by RV lead malfunction, hypertension, diabetes, atrial fibrillation who is currently admitted with a GI illness.  Her GI illness has been associated with decreased oral intake and electrolyte abnormalities.  GI illness also led to missing some oral medications.  She has had episodes of monomorphic VT in that setting.   # Monomorphic ventricular tachycardia #High risk medication use: Amiodarone  and mexiletine. - Continue home oral amiodarone  200 mg daily and mexiletine 300 mg twice daily.  She had missed some doses of acute GI illness. - Aggressive monitoring of electrolytes and repletion as necessary.   #S/p VVI ICD #RV lead malfunction -Episodes of VT fell below her treatment zone.  We will need to lower treatment zone to approximately 150 bpm prior to discharge. -She is scheduled for abandonment of her RV ICD lead implantation of new RV ICD lead with Dr. Inocencio later this month.  If she remains in the hospital through the weekend, then Dr. Inocencio can see patient on Monday to reevaluate.   #Paroxysmal atrial fibrillation: #Hypercoagulable state due to atrial fibrillation: -Continue amiodarone  and metoprolol  as above. -Continue Eliquis  5 mg twice daily.  Please notify EP when patient is ready for discharge so final programming changes can be made to her ICD.   Heidi [name], MD, [facility], [facility] Cardiac Electrophysiology

## 2024-05-24 NOTE — Plan of Care (Signed)
  Problem: Coping: Goal: Ability to adjust to condition or change in health will improve Outcome: Progressing   Problem: Fluid Volume: Goal: Ability to maintain a balanced intake and output will improve Outcome: Progressing   Problem: Health Behavior/Discharge Planning: Goal: Ability to manage health-related needs will improve Outcome: Progressing   Problem: Metabolic: Goal: Ability to maintain appropriate glucose levels will improve Outcome: Progressing   Problem: Nutritional: Goal: Maintenance of adequate nutrition will improve Outcome: Progressing   Problem: Skin Integrity: Goal: Risk for impaired skin integrity will decrease Outcome: Progressing   Problem: Tissue Perfusion: Goal: Adequacy of tissue perfusion will improve Outcome: Progressing

## 2024-05-24 NOTE — Progress Notes (Signed)
 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 Antony VVS HFrEF NYHA class III-->HF MR EF 60-65% in 2023 on GDMT CAD based on coronary CTA/cath however 2022 showing only luminal irregularities Smoker till 2022 OSA on CPAP CKD 3B Hypothyroidism followed by endocrinology  Admitted from [facility] ED nausea vomiting weakness + diarrhea in the setting of recent?  Prescription Mounjaro  Chronology  11/4-[facility] ED emergency room evaluation-volume depletion dehydration troponin 26 received IV fluid bolus Zofran  Eliquis  and subsequently because was tolerating p.o. went home-CT scan at the time no acute process 11/6 evaluated by PCP and promptly sent back to the ED-found to have volume depletion again bolus IV fluid 500 cc Reglan Benadryl etc. given for creatinine up from 1.4-->2.25-no chest pain despite troponin 54 A-fib on EKG--PPM to be interrogated 11/7 EP consulted because of runs of VT   Pertinent imaging/studies till date  11/6 CXR 1 view cardiomegaly diffuse bronchitic changes   Assessment  & Plan :    ATN causing AKI superimposed on CKD 3B from nausea vomiting Transient urinary retention Lasix  80 Inspra  25 and Entresto  held Holding fluids given heart failure h/o labs trending downward towards normalcy Bladder scan this morning shows only 108 will scan 2 more times and then stop Diarrheal illness-up to 6-8 episodes at home C. difficile negative  pathogen panel pending Still having 2-3 stools which seems semiformed personally visualized-Will start Imodium if pathogen panel is negative later today Persistent VT in the setting of electrolyte abnormalities Resumed amiodarone  200 daily, metoprolol  XL 25 and Mexitil  300 twice daily Seems to be in A-fib without any further VT-defer to EP HFmrEF EF improved recently based on June 2025 echo currently 70-75% History of VT/A-fib-on amiodarone  + Eliquis  See above Continue amiodarone  100 daily-this is medically necessary because she has difficult to manage A-fib/SVT-hopefully with lead replacement and the next several weeks we can de-escalate off some of the meds with EP help Diuretics held reimplement slowly as an outpatient would hold for  vs little later during hospital stay Resume Farxiga  10 at discharge Underlying COPD-prior smoker till 2022 Not on any specific inhalers-monitor OSA on CPAP Can use nighttime settings-tolerating DM TY 2 recent A1c 6.2-at home Humalog  KwikPen 10 units twice daily and Farxiga  which we have held CBGs 0100-0120 continue sliding scale only No Mounjaro going forward Hypothyroidism Continue Synthroid  25 I would not adjust her thyroxine until she stabilizes in the outpatient and get some repeated x 1 I do not think this was the driver for her VT and she is already on amiodarone  which obfuscates things as well Transaminitis mild She has cirrhosis on ultrasound dated 02/07/2024 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 Vs MASH? Baseline usually in 160s Monitor closely  Suspect this is secondary to diarrhea viral illness --f it continues to drop and the pathogen panel is negative would workup formally Will get  platelet smear in the morning    Data Reviewed today:   Hemoglobin 16.4 WBC 7.1 platelets 61 Sodium 136 potassium 4.4 CO2 19 BUN/creatinine down from 46/2.0-36/1.4 AST/ALT 100/37 total bili 1.7 GFR 35  DVT prophylaxis: Eliquis   Status is: Observation Inpatient    Dispo/Global plan: Await resolution of symptoms etc. she is still pretty sick we will need to ensure that she stabilizes to be able to participate with therapy-she has yet to be out of bed  Time 60   Subjective:   Overall looks brighter but still bothered by about 2-3 loose bowel movements today No fever or chills Ate a little bit of breakfast but not that hungry no vomiting however   Objective + exam Vitals:   05/23/24 2235 05/24/24 0504 05/24/24 0853 05/24/24 0918  BP:  125/77 97/61 108/68  Pulse: 85 84 87 80  Resp:  16 18   Temp:  97.7 F (36.5 C) 97.8 F (36.6 C)   TempSrc:  Oral    SpO2:  96% 99%   Weight:      Height:       Filed Weights   05/22/24 2118  Weight: 113.4 kg     Examination: EOMI NCAT thick neck Mallampati 4 Neck soft supple no JVD S1-S2 seems to be in sinus to my exam on monitors I see A-fib rate controlled Abdomen is obese nontender no rebound or guarding Trace lower extremity edema Chest no wheeze no rales  Scheduled Meds:  amiodarone   200 mg Oral Daily   apixaban   5 mg Oral BID   famotidine   20 mg Oral Daily   insulin  aspart  0-5 Units Subcutaneous QHS   insulin  aspart  0-9 Units Subcutaneous TID WC   levothyroxine   25 mcg Oral Daily   LORazepam   0.5 mg Oral QHS   metoprolol  succinate  25 mg Oral Daily   mexiletine  300 mg Oral BID   nystatin    Topical BID   potassium chloride   40 mEq Oral BID   sertraline   25 mg Oral Daily   Continuous Infusions: acetaminophen , gabapentin   Jai-Gurmukh Hinata Diener, MD  [facility]

## 2024-05-24 NOTE — Plan of Care (Signed)
  Problem: Fluid Volume: Goal: Ability to maintain a balanced intake and output will improve Outcome: Progressing   Problem: Nutritional: Goal: Maintenance of adequate nutrition will improve Outcome: Progressing   Problem: Skin Integrity: Goal: Risk for impaired skin integrity will decrease Outcome: Progressing

## 2024-05-25 DIAGNOSIS — N179 Acute kidney failure, unspecified: Secondary | ICD-10-CM | POA: Diagnosis not present

## 2024-05-25 LAB — HEPATITIS PANEL, ACUTE
HCV Ab: NONREACTIVE
Hep A IgM: NONREACTIVE
Hep B C IgM: NONREACTIVE
Hepatitis B Surface Ag: NONREACTIVE

## 2024-05-25 LAB — CBC WITH DIFFERENTIAL/PLATELET
Abs Immature Granulocytes: 0.02 K/uL (ref 0.00–0.07)
Basophils Absolute: 0 K/uL (ref 0.0–0.1)
Basophils Relative: 0 %
Eosinophils Absolute: 0.1 K/uL (ref 0.0–0.5)
Eosinophils Relative: 1 %
HCT: 47.8 % — ABNORMAL HIGH (ref 36.0–46.0)
Hemoglobin: 15.6 g/dL — ABNORMAL HIGH (ref 12.0–15.0)
Immature Granulocytes: 0 %
Lymphocytes Relative: 33 %
Lymphs Abs: 2.5 K/uL (ref 0.7–4.0)
MCH: 29.9 pg (ref 26.0–34.0)
MCHC: 32.6 g/dL (ref 30.0–36.0)
MCV: 91.6 fL (ref 80.0–100.0)
Monocytes Absolute: 0.9 K/uL (ref 0.1–1.0)
Monocytes Relative: 13 %
Neutro Abs: 3.9 K/uL (ref 1.7–7.7)
Neutrophils Relative %: 53 %
Platelets: 61 K/uL — ABNORMAL LOW (ref 150–400)
RBC: 5.22 MIL/uL — ABNORMAL HIGH (ref 3.87–5.11)
RDW: 15.9 % — ABNORMAL HIGH (ref 11.5–15.5)
Smear Review: NORMAL
WBC: 7.4 K/uL (ref 4.0–10.5)
nRBC: 0 % (ref 0.0–0.2)

## 2024-05-25 LAB — COMPREHENSIVE METABOLIC PANEL WITH GFR
ALT: 36 U/L (ref 0–44)
AST: 92 U/L — ABNORMAL HIGH (ref 15–41)
Albumin: 2.3 g/dL — ABNORMAL LOW (ref 3.5–5.0)
Alkaline Phosphatase: 44 U/L (ref 38–126)
Anion gap: 11 (ref 5–15)
BUN: 22 mg/dL (ref 8–23)
CO2: 20 mmol/L — ABNORMAL LOW (ref 22–32)
Calcium: 7.9 mg/dL — ABNORMAL LOW (ref 8.9–10.3)
Chloride: 106 mmol/L (ref 98–111)
Creatinine, Ser: 1.23 mg/dL — ABNORMAL HIGH (ref 0.44–1.00)
GFR, Estimated: 44 mL/min — ABNORMAL LOW (ref >60)
Glucose, Bld: 125 mg/dL — ABNORMAL HIGH (ref 70–99)
Potassium: 5 mmol/L (ref 3.5–5.1)
Sodium: 137 mmol/L (ref 135–145)
Total Bilirubin: 1.3 mg/dL — ABNORMAL HIGH (ref 0.0–1.2)
Total Protein: 5.4 g/dL — ABNORMAL LOW (ref 6.5–8.1)

## 2024-05-25 LAB — TECHNOLOGIST SMEAR REVIEW: Plt Morphology: NORMAL

## 2024-05-25 LAB — GLUCOSE, CAPILLARY
Glucose-Capillary: 142 mg/dL — ABNORMAL HIGH (ref 70–99)
Glucose-Capillary: 91 mg/dL (ref 70–99)
Glucose-Capillary: 133 mg/dL — ABNORMAL HIGH (ref 70–99)
Glucose-Capillary: 97 mg/dL (ref 70–99)

## 2024-05-25 LAB — MAGNESIUM: Magnesium: 2.1 mg/dL (ref 1.7–2.4)

## 2024-05-25 MED ORDER — LOPERAMIDE HCL 2 MG PO CAPS
2.0000 mg | ORAL_CAPSULE | Freq: Two times a day (BID) | ORAL | Status: DC
  Administered 2024-05-25 – 2024-05-27 (×5): 2 mg via ORAL
  Filled 2024-05-25 (×5): qty 1

## 2024-05-25 NOTE — Evaluation (Signed)
 Physical Therapy Evaluation Patient Details Name: Heidi Stark MRN: [id number] DOB: [birth year] Today's Date: 05/25/2024  History of Present Illness  83 y.o. female presents to [facility] hospital on 05/22/2024 with nausea, vomiting, diarrhea and weakness. Pt admitted for management of AKI, also with Vtach run on 11/7. PMH includes OSA, HTN, COPD, PAF, CKD III, DMII.  Clinical Impression  Pt presents to PT with deficits in functional mobility, balance, strength, power, endurance. Pt typically stands and pivots from lift chair to [facility] at home, only ambulating once inside her bathroom with support of grab bars. Today the pt is able to stand and perform step pivot transfers from an elevated bed to simulate lift chair. Pt has difficulty rising from lower recliner surface, although she feels that is lower than her wheelchair height at home. PT will follow in an effort to improve transfer quality. PT recommends discharge home with continued HHPT when medically appropriate.        If plan is discharge home, recommend the following: A little help with walking and/or transfers;A little help with bathing/dressing/bathroom;Assistance with cooking/housework;Assist for transportation;Help with stairs or ramp for entrance   Can travel by private vehicle        Equipment Recommendations None recommended by PT  Recommendations for Other Services       Functional Status Assessment Patient has had a recent decline in their functional status and demonstrates the ability to make significant improvements in function in a reasonable and predictable amount of time.     Precautions / Restrictions Precautions Precautions: Fall Recall of Precautions/Restrictions: Intact Restrictions Weight Bearing Restrictions Per Provider Order: No      Mobility  Bed Mobility Overal bed mobility: Needs Assistance Bed Mobility: Rolling, Sidelying to Sit, Sit to Supine Rolling: Contact guard assist Sidelying to sit: Min assist,  Used rails   Sit to supine: Min assist        Transfers Overall transfer level: Needs assistance Equipment used: Rolling walker (2 wheels), 1 person hand held assist Transfers: Sit to/from Stand, Bed to chair/wheelchair/BSC Sit to Stand: Contact guard assist, Mod assist   Step pivot transfers: Min assist       General transfer comment: CGA from elevated bed with RW, modA to rise from low recliner with face to face assist from PT    Ambulation/Gait                  Stairs            Wheelchair Mobility     Tilt Bed    Modified Rankin (Stroke Patients Only)       Balance Overall balance assessment: Needs assistance Sitting-balance support: No upper extremity supported, Feet supported Sitting balance-Leahy Scale: Fair     Standing balance support: Bilateral upper extremity supported, Reliant on assistive device for balance Standing balance-Leahy Scale: Poor                               Pertinent Vitals/Pain Pain Assessment Pain Assessment: Faces Faces Pain Scale: Hurts little more Pain Location: chronic low back Pain Descriptors / Indicators: Aching Pain Intervention(s): Monitored during session    Home Living Family/patient expects to be discharged to:: Private residence Living Arrangements: Children Available Help at Discharge: Family;Available 24 hours/day Type of Home: Other(Comment) Home Access: Stairs to enter;Ramped entrance Entrance Stairs-Rails: None Entrance Stairs-Number of Steps: 3   Home Layout: One level Home Equipment: [employer] (2 wheels);Rollator (4  wheels);BSC/3in1;Lift chair;Grab bars - toilet;Wheelchair - manual Additional Comments: BSC is in the shower seat    Prior Function Prior Level of Function : Needs assist             Mobility Comments: sleeps in lift chair, performs SPT to [facility] and utilizes the wheelchair around the home. Wheelchair will not fit in her bathroom so she holds onto grab bars  to get to the commode. Assist for shower transfers ADLs Comments: daughter assists with IADLs, lower body dressing.     Extremity/Trunk Assessment   Upper Extremity Assessment Upper Extremity Assessment: Overall WFL for tasks assessed    Lower Extremity Assessment Lower Extremity Assessment: Generalized weakness    Cervical / Trunk Assessment Cervical / Trunk Assessment: Other exceptions Cervical / Trunk Exceptions: body habitus  Communication   Communication Communication: No apparent difficulties    Cognition Arousal: Alert Behavior During Therapy: WFL for tasks assessed/performed   PT - Cognitive impairments: No apparent impairments                         Following commands: Intact       Cueing Cueing Techniques: Verbal cues     General Comments General comments (skin integrity, edema, etc.): VSS on RA    Exercises     Assessment/Plan    PT Assessment Patient needs continued PT services  PT Problem List Decreased strength;Decreased activity tolerance;Decreased balance;Decreased mobility;Decreased knowledge of use of DME       PT Treatment Interventions DME instruction;Functional mobility training;Therapeutic activities;Therapeutic exercise;Balance training;Neuromuscular re-education;Gait training;Cognitive remediation;Patient/family education;Wheelchair mobility training    PT Goals (Current goals can be found in the Care Plan section)  Acute Rehab PT Goals Patient Stated Goal: to return home PT Goal Formulation: With patient Time For Goal Achievement: 06/08/24 Potential to Achieve Goals: Fair    Frequency Min 2X/week     Co-evaluation               AM-PAC PT 6 Clicks Mobility  Outcome Measure Help needed turning from your back to your side while in a flat bed without using bedrails?: A Little Help needed moving from lying on your back to sitting on the side of a flat bed without using bedrails?: A Little Help needed moving to  and from a bed to a chair (including a wheelchair)?: A Little Help needed standing up from a chair using your arms (e.g., wheelchair or bedside chair)?: A Little Help needed to walk in hospital room?: Total Help needed climbing 3-5 steps with a railing? : Total 6 Click Score: 14    End of Session Equipment Utilized During Treatment: Gait belt Activity Tolerance: Patient tolerated treatment well Patient left: in bed;with call bell/phone within reach;with bed alarm set Nurse Communication: Mobility status PT Visit Diagnosis: Other abnormalities of gait and mobility (R26.89);Muscle weakness (generalized) (M62.81)    Time: 8374-8351 PT Time Calculation (min) (ACUTE ONLY): 23 min   Charges:   PT Evaluation $PT Eval Low Complexity: 1 Low   PT General Charges $$ ACUTE PT VISIT: 1 Visit         Bernardino JINNY Ruth, PT, DPT Acute Rehabilitation Office [phone]   Bernardino JINNY Ruth 05/25/2024, 4:55 PM

## 2024-05-25 NOTE — Progress Notes (Signed)
 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 Antony VVS HFrEF NYHA class III-->HF MR EF 60-65% in 2023 on GDMT CAD based on coronary CTA/cath however 2022 showing only luminal irregularities Smoker till 2022 OSA on CPAP CKD 3B Hypothyroidism followed by endocrinology  Admitted from [facility] ED nausea vomiting weakness + diarrhea in the setting of recent?  Prescription Mounjaro  Chronology  11/4-[facility] ED emergency room evaluation-volume depletion dehydration troponin 26 received IV fluid bolus Zofran  Eliquis  and subsequently because was tolerating p.o. went home-CT scan at the time no acute process 11/6 evaluated by PCP and promptly sent back to the ED-found to have volume depletion again bolus IV fluid 500 cc Reglan Benadryl etc. given for creatinine up from 1.4-->2.25-no chest pain despite troponin 54 A-fib on EKG--PPM to be interrogated 11/7 EP consulted because of runs of VT   Pertinent imaging/studies till date  11/6 CXR 1 view cardiomegaly diffuse bronchitic changes   Assessment  & Plan :    ATN causing AKI superimposed on CKD 3B from nausea vomiting Transient urinary retention Lasix  80 Inspra  25 and Entresto  held Labs are improving so no fluids no diuretics and would hold off on I/O cath given bladder scans have been negative recently in the past 24 hours Diarrheal  illness-up to 6-8 episodes at home C. difficile pathogen panel negative Periodic Imodium twice daily 2 mg monitor for resolution Persistent VT in the setting of electrolyte abnormalities Resumed amiodarone  200 daily, metoprolol  XL 25 and Mexitil  300 twice daily Seems to be in A-fib without any further VT-d/W Dr. [name] just prior to discharge for reprogramming pacer--- will make a.m. team aware of the second HFmrEF EF improved recently based on June 2025 echo currently 70-75% History of VT/A-fib-on amiodarone  + Eliquis  See above Continue amiodarone  100 daily-this is medically necessary 2/2 recalcitrant VT Resume Farxiga  10 at discharge--slow resumption diuretics in the next 24 hours Underlying COPD-prior smoker till 2022 Not on any specific inhalers-monitor OSA on CPAP Can use nighttime settings-tolerating DM TY 2 recent A1c 6.2-at home Humalog  KwikPen 10 units twice daily and Farxiga  which we have held CBGs 91-1 40-on sensitive + at bedtime without mealtime coverage No Mounjaro going forward Hypothyroidism Continue Synthroid  25 I would not adjust her thyroxine until she stabilizes in the outpatient  VT unrelated to thyroid  issues and she is already on amiodarone  which obfuscates things as well Transaminitis mild 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 Outpatient workup Thrombocytopenia probably related to diarrheal illness? Vs MASH? Baseline usually in 160s Suspect this is secondary to diarrhea viral illness --smear is negative essentially but may need outpatient heme workup if persistent    Data Reviewed today:   Sodium 137 potassium 5.0 BUN/creatinine 22/1.2 bicarb 20 AST 92 ALT  36 bilirubin 1.3 WBC 7.4 hemoglobin 15.6 platelets 61  DVT prophylaxis: Eliquis   Status is: Observation Inpatient    Dispo/Global plan: Await  resolution of diarrhea and likely can discharge in the next several days  Time 30   Subjective:   In better spirits Only 1 stool Has not been out of bed At baseline is in wheelchair but transfers and has not been out of bed here No chest pain   Objective + exam Vitals:   05/24/24 1751 05/24/24 2016 05/25/24 0523 05/25/24 0852  BP: (!) 100/57 99/60 (!) 102/54 (!) 109/58  Pulse: 93 76 (!) 43 68  Resp: 18 18 18 18   Temp: 98 F (36.7 C) 98 F (36.7 C) 98 F (36.7 C) (!) 97.5 F (36.4 C)  TempSrc:  Oral Oral   SpO2: 94% 94% 94% 96%  Weight:      Height:       Filed Weights   05/22/24 2118  Weight: 113.4 kg     Examination:  EOMI NCAT no focal deficit no icterus no pallor no wheeze no rales no rhonchi CTAB no added sound ROM intact S1-S2 no murmur no rub no gallop telemetry personally reviewed shows mainly rate controlled A-fib no SVT no V. tach Power 5/5  Scheduled Meds:  amiodarone   200 mg Oral Daily   apixaban   5 mg Oral BID   famotidine   20 mg Oral Daily   insulin  aspart  0-5 Units Subcutaneous QHS   insulin  aspart  0-9 Units Subcutaneous TID WC   levothyroxine   25 mcg Oral Daily   loperamide  2 mg Oral BID   LORazepam   0.5 mg Oral QHS   metoprolol  succinate  25 mg Oral Daily   mexiletine  300 mg Oral BID   nystatin    Topical BID   potassium chloride   40 mEq Oral BID   sertraline   25 mg Oral Daily   Continuous Infusions: acetaminophen , gabapentin   Jai-Gurmukh Mellody Masri, MD  [facility]

## 2024-05-25 NOTE — Plan of Care (Signed)
  Problem: Coping: Goal: Ability to adjust to condition or change in health will improve Outcome: Progressing   Problem: Fluid Volume: Goal: Ability to maintain a balanced intake and output will improve Outcome: Progressing   Problem: Health Behavior/Discharge Planning: Goal: Ability to identify and utilize available resources and services will improve Outcome: Progressing   Problem: Metabolic: Goal: Ability to maintain appropriate glucose levels will improve Outcome: Progressing   Problem: Nutritional: Goal: Maintenance of adequate nutrition will improve Outcome: Progressing   Problem: Skin Integrity: Goal: Risk for impaired skin integrity will decrease Outcome: Progressing   Problem: Tissue Perfusion: Goal: Adequacy of tissue perfusion will improve Outcome: Progressing   

## 2024-05-26 ENCOUNTER — Inpatient Hospital Stay (HOSPITAL_COMMUNITY)

## 2024-05-26 DIAGNOSIS — N179 Acute kidney failure, unspecified: Secondary | ICD-10-CM | POA: Diagnosis not present

## 2024-05-26 DIAGNOSIS — I480 Paroxysmal atrial fibrillation: Secondary | ICD-10-CM | POA: Diagnosis not present

## 2024-05-26 DIAGNOSIS — I472 Ventricular tachycardia, unspecified: Secondary | ICD-10-CM | POA: Diagnosis not present

## 2024-05-26 LAB — COMPREHENSIVE METABOLIC PANEL WITH GFR
ALT: 32 U/L (ref 0–44)
AST: 58 U/L — ABNORMAL HIGH (ref 15–41)
Albumin: 2.4 g/dL — ABNORMAL LOW (ref 3.5–5.0)
Alkaline Phosphatase: 42 U/L (ref 38–126)
Anion gap: 12 (ref 5–15)
BUN: 14 mg/dL (ref 8–23)
CO2: 20 mmol/L — ABNORMAL LOW (ref 22–32)
Calcium: 7.9 mg/dL — ABNORMAL LOW (ref 8.9–10.3)
Chloride: 104 mmol/L (ref 98–111)
Creatinine, Ser: 1.05 mg/dL — ABNORMAL HIGH (ref 0.44–1.00)
GFR, Estimated: 53 mL/min — ABNORMAL LOW (ref >60)
Glucose, Bld: 114 mg/dL — ABNORMAL HIGH (ref 70–99)
Potassium: 4 mmol/L (ref 3.5–5.1)
Sodium: 136 mmol/L (ref 135–145)
Total Bilirubin: 1 mg/dL (ref 0.0–1.2)
Total Protein: 5.5 g/dL — ABNORMAL LOW (ref 6.5–8.1)

## 2024-05-26 LAB — CBC WITH DIFFERENTIAL/PLATELET
Abs Immature Granulocytes: 0.06 K/uL (ref 0.00–0.07)
Abs Immature Granulocytes: 0.05 K/uL (ref 0.00–0.07)
Basophils Absolute: 0 K/uL (ref 0.0–0.1)
Basophils Absolute: 0 K/uL (ref 0.0–0.1)
Basophils Relative: 0 %
Basophils Relative: 0 %
Eosinophils Absolute: 0.1 K/uL (ref 0.0–0.5)
Eosinophils Absolute: 0.1 K/uL (ref 0.0–0.5)
Eosinophils Relative: 1 %
Eosinophils Relative: 1 %
HCT: 48.8 % — ABNORMAL HIGH (ref 36.0–46.0)
HCT: 49.7 % — ABNORMAL HIGH (ref 36.0–46.0)
Hemoglobin: 16 g/dL — ABNORMAL HIGH (ref 12.0–15.0)
Hemoglobin: 16 g/dL — ABNORMAL HIGH (ref 12.0–15.0)
Immature Granulocytes: 1 %
Immature Granulocytes: 1 %
Lymphocytes Relative: 25 %
Lymphocytes Relative: 24 %
Lymphs Abs: 2.9 K/uL (ref 0.7–4.0)
Lymphs Abs: 2.5 K/uL (ref 0.7–4.0)
MCH: 30.1 pg (ref 26.0–34.0)
MCH: 29.7 pg (ref 26.0–34.0)
MCHC: 32.8 g/dL (ref 30.0–36.0)
MCHC: 32.2 g/dL (ref 30.0–36.0)
MCV: 91.9 fL (ref 80.0–100.0)
MCV: 92.2 fL (ref 80.0–100.0)
Monocytes Absolute: 1.5 K/uL — ABNORMAL HIGH (ref 0.1–1.0)
Monocytes Absolute: 1.4 K/uL — ABNORMAL HIGH (ref 0.1–1.0)
Monocytes Relative: 13 %
Monocytes Relative: 13 %
Neutro Abs: 6.9 K/uL (ref 1.7–7.7)
Neutro Abs: 6.4 K/uL (ref 1.7–7.7)
Neutrophils Relative %: 60 %
Neutrophils Relative %: 61 %
Platelets: 65 K/uL — ABNORMAL LOW (ref 150–400)
Platelets: 68 K/uL — ABNORMAL LOW (ref 150–400)
RBC: 5.31 MIL/uL — ABNORMAL HIGH (ref 3.87–5.11)
RBC: 5.39 MIL/uL — ABNORMAL HIGH (ref 3.87–5.11)
RDW: 16 % — ABNORMAL HIGH (ref 11.5–15.5)
RDW: 16 % — ABNORMAL HIGH (ref 11.5–15.5)
WBC: 11.5 K/uL — ABNORMAL HIGH (ref 4.0–10.5)
WBC: 10.5 K/uL (ref 4.0–10.5)
nRBC: 0 % (ref 0.0–0.2)
nRBC: 0 % (ref 0.0–0.2)

## 2024-05-26 LAB — BASIC METABOLIC PANEL WITH GFR
Anion gap: 12 (ref 5–15)
BUN: 17 mg/dL (ref 8–23)
CO2: 21 mmol/L — ABNORMAL LOW (ref 22–32)
Calcium: 8.2 mg/dL — ABNORMAL LOW (ref 8.9–10.3)
Chloride: 105 mmol/L (ref 98–111)
Creatinine, Ser: 1.15 mg/dL — ABNORMAL HIGH (ref 0.44–1.00)
GFR, Estimated: 48 mL/min — ABNORMAL LOW (ref >60)
Glucose, Bld: 98 mg/dL (ref 70–99)
Potassium: 5.5 mmol/L — ABNORMAL HIGH (ref 3.5–5.1)
Sodium: 138 mmol/L (ref 135–145)

## 2024-05-26 LAB — MAGNESIUM: Magnesium: 2.3 mg/dL (ref 1.7–2.4)

## 2024-05-26 LAB — GLUCOSE, CAPILLARY
Glucose-Capillary: 130 mg/dL — ABNORMAL HIGH (ref 70–99)
Glucose-Capillary: 108 mg/dL — ABNORMAL HIGH (ref 70–99)
Glucose-Capillary: 111 mg/dL — ABNORMAL HIGH (ref 70–99)
Glucose-Capillary: 116 mg/dL — ABNORMAL HIGH (ref 70–99)

## 2024-05-26 LAB — MRSA NEXT GEN BY PCR, NASAL: MRSA by PCR Next Gen: DETECTED — AB

## 2024-05-26 MED ORDER — SACUBITRIL-VALSARTAN 24-26 MG PO TABS
1.0000 | ORAL_TABLET | Freq: Two times a day (BID) | ORAL | Status: DC
  Administered 2024-05-26 – 2024-05-30 (×4): 1 via ORAL
  Filled 2024-05-26 (×9): qty 1

## 2024-05-26 MED ORDER — DAPAGLIFLOZIN PROPANEDIOL 5 MG PO TABS
5.0000 mg | ORAL_TABLET | Freq: Every day | ORAL | Status: DC
  Administered 2024-05-26 – 2024-06-02 (×8): 5 mg via ORAL
  Filled 2024-05-26 (×8): qty 1

## 2024-05-26 MED ORDER — GUAIFENESIN-DM 100-10 MG/5ML PO SYRP: 5.0000 mL | ORAL_SOLUTION | ORAL | Status: DC | PRN

## 2024-05-26 MED ORDER — FUROSEMIDE 40 MG PO TABS
80.0000 mg | ORAL_TABLET | Freq: Every day | ORAL | Status: DC
  Administered 2024-05-26 – 2024-05-27 (×2): 80 mg via ORAL
  Filled 2024-05-26 (×2): qty 2

## 2024-05-26 MED ORDER — ORAL CARE MOUTH RINSE: 15.0000 mL | OROMUCOSAL | Status: DC | PRN

## 2024-05-26 NOTE — Progress Notes (Signed)
   05/26/24 0003  BiPAP/CPAP/SIPAP  $ Non-Invasive Home Ventilator  Subsequent  BiPAP/CPAP/SIPAP Pt Type Adult  BiPAP/CPAP/SIPAP Resmed  Mask Type Full face mask  Dentures removed? Not applicable  Mask Size Medium  Respiratory Rate 21 breaths/min  EPAP 5 cmH2O  FiO2 (%) 21 %  Patient Home Machine No  Patient Home Mask No  Patient Home Tubing No  Auto Titrate No  CPAP/SIPAP surface wiped down Yes  Device Plugged into [employer] Power Outlet Yes

## 2024-05-26 NOTE — Progress Notes (Signed)
 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 Antony VVS HFrEF NYHA class III-->HF MR EF 60-65% in 2023 on GDMT CAD based on coronary CTA/cath however 2022 showing only luminal irregularities Smoker till 2022 OSA on CPAP CKD 3B Hypothyroidism followed by endocrinology  Admitted from [facility] ED nausea vomiting weakness + diarrhea in the setting of recent?  Prescription Mounjaro  Chronology  11/4-[facility] ED emergency room evaluation-volume depletion dehydration troponin 26 received IV fluid bolus Zofran  Eliquis  and subsequently because was tolerating p.o. went home-CT scan at the time no acute process 11/6 evaluated by PCP and promptly sent back to the ED-found to have volume depletion again bolus IV fluid 500 cc Reglan Benadryl etc. given for creatinine up from 1.4-->2.25-no chest pain despite troponin 54 A-fib on EKG--PPM to be interrogated 11/7 EP consulted because of runs of VT   Pertinent imaging/studies till date  11/6 CXR 1 view cardiomegaly diffuse bronchitic changes   Assessment  & Plan :    ATN causing AKI superimposed on CKD 3B from nausea vomiting Transient urinary retention Mild iatrogenic hyperkalemia--stop KDur---am labs Resumed low dose lasix  80 od, Entresto  1 bid--watch fluid status/Lytes in am Urinary retention resolved Diarrheal illness-up to 6-8 episodes at  home C. difficile pathogen panel negative Periodic Imodium twice daily 2 mg monitor for resolution---some stool today per her, per RN only 1 semi-formed one Persistent VT in the setting of electrolyte abnormalities Resumed amiodarone  200 daily, metoprolol  XL 25 and Mexitil  300 twice daily Seems to be in A-fib without any further VT PPM adjusted by EP 11/10 HFmrEF EF improved recently based on June 2025 echo currently 70-75% History of VT/A-fib-on amiodarone  + Eliquis  Continue amiodarone  100 daily-this is medically necessary 2/2 recalcitrant VT Resume Farxiga  10 at discharge--slow resumption diuretics as  ? 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 Hepatitis panel negative Thrombocytopenia probably related to diarrheal illness? 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 Baseline usually in 160s Etiology unclear-drug induced? Will ask Pharm to review [? Ativan ? Amio?]-smear overall neg--LDH, Hapto , retic in am.   Data Reviewed today:   Wbc 10.5 hb16 PLT 68 Na 138, K 5.5 bun creat 17/1.15 Cxr no PNA  DVT prophylaxis: Eliquis   Status is: Observation Inpatient   Dispo/Global plan: d/c with HH in am  Time 30   Subjective:   Sleepy Some cough No PNA on cxr Some abd discomfort   Objective + exam Vitals:  05/25/24 2040 05/26/24 0445 05/26/24 0856 05/26/24 1636  BP: (!) 100/52 96/60 100/63 (!) 93/59  Pulse: 64 70 66 74  Resp:  18 18 18   Temp:  98.2 F (36.8 C) (!) 97.4 F (36.3 C) 97.9 F (36.6  C)  TempSrc:      SpO2:  97% 97% 95%  Weight:      Height:       Filed Weights   05/22/24 2118  Weight: 113.4 kg     Examination:  EOMI NCAT no focal deficit no icterus no pallor no wheeze no rales no rhonchi CTAB no added sound Abd slight tender in Lower quad S1-S2 no murmur no rub no gallop telemetry personally reviewed shows mainly rate controlled A-fib Power 5/5  Scheduled Meds:  amiodarone   200 mg Oral Daily   apixaban   5 mg Oral BID   dapagliflozin  propanediol  5 mg Oral Daily   famotidine   20 mg Oral Daily   furosemide   80 mg Oral Daily   insulin  aspart  0-5 Units Subcutaneous QHS   insulin  aspart  0-9 Units Subcutaneous TID WC   levothyroxine   25 mcg Oral Daily   loperamide  2 mg Oral BID   LORazepam   0.5 mg Oral QHS   metoprolol  succinate  25 mg Oral Daily   mexiletine  300 mg Oral BID   nystatin    Topical BID   sacubitril -valsartan   1 tablet Oral BID   sertraline   25 mg Oral Daily   Continuous Infusions: acetaminophen , gabapentin , guaiFENesin -dextromethorphan, mouth rinse  Colen Grimes, MD  [facility]

## 2024-05-26 NOTE — Progress Notes (Addendum)
 Patient Name: Heidi Stark Date of Encounter: 05/26/2024  Primary Cardiologist: Ezra Shuck, MD Electrophysiologist: Quientin Jent Gladis Norton, MD  Interval Summary   The patient is doing well today.  At this time, the patient denies chest pain, shortness of breath, or any new concerns.  Vital Signs    Vitals:   05/25/24 1651 05/25/24 2034 05/25/24 2040 05/26/24 0445  BP: 110/60 (!) 92/54 (!) 100/52 96/60  Pulse: 69 65 64 70  Resp:  18  18  Temp: (!) 97.5 F (36.4 C) (!) 97.4 F (36.3 C)  98.2 F (36.8 C)  TempSrc: Oral     SpO2: 94% 95%  97%  Weight:      Height:        Intake/Output Summary (Last 24 hours) at 05/26/2024 0720 Last data filed at 05/26/2024 0446 Gross per 24 hour  Intake 120 ml  Output 950 ml  Net -830 ml   Filed Weights   05/22/24 2118  Weight: 113.4 kg    Physical Exam    GEN- NAD, Alert and oriented  Lungs- Clear to ausculation bilaterally, normal work of breathing Cardiac- Irregularly irregular rate and rhythm, no murmurs, rubs or gallops GI- soft, NT, ND, + BS Extremities- no clubbing or cyanosis. No edema  Telemetry    AF 60-70's (personally reviewed)  Hospital Course    Heidi Stark is a 83 y.o. female with PMH of HFrecEF, VT s/p ICD now complicated by RV lead malfunction, AF, HTN, DM admitted for GI illness and electrolyte disturbances.  She had missed oral medications due to GI illness and subsequently had episodes of MMVT.    Assessment & Plan    Monomorphic VT  High Risk Medication Monitoring: Amiodarone  & Mexiletine  -continue amiodarone  200 mg daily  -mexiletine 300 mg BID  -Toprol  25 mg daily  -tele monitoring  -follow electrolytes, goal K+ >/= 4, Mg+ >/= 2  VVI ICD  RV Lead Malfunction  Impedence > 2800 on device  -episodes of VT fell below VT zone prior to admit  -VT-1 Zone adjusted to 150 bpm (from 176 bpm), reviewed VT episodes and cycle length - 370-342ms.  Final parameters as below.  -she is scheduled for  insertion of new RV ICD lead and abandonment of current RV ICD lead 06/05/24 (Paitlyn Mcclatchey confirm with Dr. Norton that this is still the plan as patient thought it was to be delayed)  Paroxysmal Atrial Fibrillation  -OAC for stroke prophylaxis  -amiodarone  as above  -continue Toprol  25 as above  Secondary Hypercoagulable State  -continue Eliquis  5mg  BID, dose reviewed and appropriate by wt / Cr      For questions or updates, please contact [facility] Please consult [email or web address] for contact info under     Signed, Daphne Barrack, NP-C, AGACNP-BC [facility] - Electrophysiology  05/26/2024, 10:25 AM       Heidi Stark was seen by me today along with Daphne Barrack. I have personally performed an evaluation on this patient.  My findings are as follows: 83 y.o. female feeling tired and fatigued..  Data: EKG(s) and pertinent labs, studies, etc were personally reviewed and interpreted by me:  EKG, telemetry Otherwise, I agree with data as outlined by the advanced practice provider.  Exam performed by me: Gen: No acute distress Neck: No JVD Cardiac: Irregular Lungs: Normal work of breathing Extremities: No edema  My Assessment and Plan:  1.  Monomorphic ventricular tachycardia: On amiodarone  and mexiletine.  No further episodes on  telemetry.  Keep potassium greater than 4, magnesium  greater than 2.  Have adjusted parameters as above.  Episodes of VT were below monitor zone.  2.  RV lead malfunction: Heidi Stark need revision as an outpatient.  3.  Paroxysmal atrial fibrillation: On anticoagulation for stroke prophylaxis.  Continue amiodarone .      Signed,  Letrice Pollok Gladis Norton, MD  05/26/2024 1:35 PM

## 2024-05-26 NOTE — Progress Notes (Signed)
 MEDICATION RELATED CONSULT NOTE - FOLLOW UP  Pharmacy Consult for medications that may cause thrombocytopenia   Assessment: Medications most commonly associated with thrombocytopenia that she is on: Amiodarone  < 1% Furosemide  Lorazepam  < 1% Sertraline  Mexiletene has post marketing <1% thrombocytopenia  Based on fill history and outpatient visits, none of these are very new to her.  *Looks like sertraline  was started in February 2025, would consider discontinuing or changing this drug.    --------------------------------------------------------------------------------------   Allergies  Allergen Reactions   Neomycin-Bacitracin Zn-Polymyx Rash   Sulfa Antibiotics Diarrhea and Nausea And Vomiting   Ciprofloxacin  Hives, Itching and Rash    Pt doesn't remember having any reaction to cipro    Spironolactone  Rash    Patient Measurements: Height: 5' 6 (167.6 cm) Weight: 113.4 kg (250 lb) IBW/kg (Calculated) : 59.3  Vital Signs: Temp: 97.8 F (36.6 C) (11/10 1928) BP: 104/63 (11/10 1928) Pulse Rate: 72 (11/10 1928) Intake/Output from previous day: 11/09 0701 - 11/10 0700 In: 120 [P.O.:120] Out: 950 [Urine:950] Intake/Output from this shift: No intake/output data recorded.  Labs: Recent Labs    05/24/24 0921 05/25/24 0316 05/25/24 0805 05/26/24 0522 05/26/24 1616  WBC 7.1  --  7.4 11.5* 10.5  HGB 16.4*  --  15.6* 16.0* 16.0*  HCT 48.9*  --  47.8* 48.8* 49.7*  PLT 61*  --  61* 65* 68*  CREATININE 1.47*  --  1.23* 1.15* 1.05*  MG  --  2.1  --  2.3  --   ALBUMIN 2.5*  --  2.3*  --  2.4*  PROT 5.6*  --  5.4*  --  5.5*  AST 100*  --  92*  --  58*  ALT 37  --  36  --  32  ALKPHOS 46  --  44  --  42  BILITOT 1.7*  --  1.3*  --  1.0   Estimated Creatinine Clearance: 52.8 mL/min (A) (by C-G formula based on SCr of 1.05 mg/dL (H)).   Microbiology:  Medications:  Medications Prior to Admission  Medication Sig Dispense Refill Last Dose/Taking   amiodarone  (PACERONE )  200 MG tablet TAKE 1 TABLET BY MOUTH EVERY DAY 90 tablet 3 05/22/2024 Evening   apixaban  (ELIQUIS ) 5 MG TABS tablet TAKE 1 TABLET BY MOUTH TWICE A DAY 180 tablet 1 05/22/2024 at  9:00 AM   clotrimazole (LOTRIMIN) 1 % cream Apply to affected area 2 times daily 15 g 0 05/22/2024 Morning   dapagliflozin  propanediol (FARXIGA ) 10 MG TABS tablet TAKE 1 TABLET BY MOUTH EVERY DAY BEFORE BREAKFAST 90 tablet 3 05/22/2024 Morning   ENTRESTO  24-26 MG TAKE 1 TABLET BY MOUTH TWICE A DAY 180 tablet 8 05/22/2024 Morning   eplerenone  (INSPRA ) 25 MG tablet Take 1 tablet (25 mg total) by mouth daily. 30 tablet 11 05/22/2024 Morning   famotidine  (PEPCID ) 20 MG tablet Take 1 tablet (20 mg total) by mouth daily. 90 tablet 0 05/22/2024 Morning   fenofibrate  (TRICOR ) 145 MG tablet TAKE 1 TABLET BY MOUTH EVERY DAY 90 tablet 3 05/22/2024   furosemide  (LASIX ) 40 MG tablet Take 80 mg by mouth 2 (two) times daily.   05/22/2024 Morning   gabapentin  (NEURONTIN ) 300 MG capsule Take 300 mg by mouth 2 (two) times daily as needed (pain).   Past Week   HUMALOG  MIX 75/25 KWIKPEN (75-25) 100 UNIT/ML KwikPen Take 10 units with morning meal and 10 units with evening meal (Patient taking differently: Inject 0-30 Units into the skin daily. If BS is  100 or < = No Insulin  100 - 140 = 10 Units Over 140 = 30 Units in the Morning and 20 Units at Bedtime)   Past Week   levothyroxine  (SYNTHROID ) 25 MCG tablet Take 25 mcg by mouth daily.   05/22/2024 Morning   LIVALO  4 MG TABS TAKE 1 TABLET BY MOUTH EVERY DAY 90 tablet 1 05/22/2024 Morning   LORazepam  (ATIVAN ) 0.5 MG tablet Take 1 tablet (0.5 mg total) by mouth at bedtime. 30 tablet 1 05/21/2024   metoprolol  succinate (TOPROL -XL) 25 MG 24 hr tablet TAKE 1 TABLET BY MOUTH EVERYDAY AT BEDTIME (Patient taking differently: Take 25 mg by mouth daily.) 90 tablet 3 05/22/2024 Morning   mexiletine (MEXITIL ) 150 MG capsule TAKE 2 CAPSULES (300 MG TOTAL) BY MOUTH 2 (TWO) TIMES DAILY. 360 capsule 3 05/22/2024 Morning    MOUNJARO 2.5 MG/0.5ML Pen Inject 2.5 mg into the skin once a week. Thursdays   05/15/2024   Multiple Vitamin (MULTIVITAMIN WITH MINERALS) TABS tablet Take 1 tablet by mouth daily.   05/22/2024 Morning   ondansetron  (ZOFRAN -ODT) 4 MG disintegrating tablet Take 1 tablet (4 mg total) by mouth every 6 (six) hours as needed for nausea or vomiting. 20 tablet 0 05/22/2024 Evening   potassium chloride  (MICRO-K ) 10 MEQ CR capsule TAKE 1 CAPSULE BY MOUTH 2 TIMES DAILY. 60 capsule 3 05/22/2024 Morning   sertraline  (ZOLOFT ) 25 MG tablet Take 1 tablet (25 mg total) by mouth daily. 90 tablet 2 05/22/2024 Morning   VASCEPA  1 g capsule TAKE 2 CAPSULES BY MOUTH TWICE A DAY 120 capsule 5 05/22/2024 Morning   acetaminophen  (TYLENOL ) 500 MG tablet Take 500 mg by mouth every 6 (six) hours as needed for moderate pain (pain score 4-6) or headache.      BD PEN NEEDLE NANO 2ND GEN 32G X 4 MM MISC See admin instructions.      ondansetron  (ZOFRAN ) 8 MG tablet Take 1 tablet (8 mg total) by mouth every 8 (eight) hours as needed for nausea or vomiting. (Patient not taking: Reported on 05/23/2024) 30 tablet 0 Not Taking   ONETOUCH VERIO test strip 1 each 3 (three) times daily.      Scheduled:   amiodarone   200 mg Oral Daily   apixaban   5 mg Oral BID   dapagliflozin  propanediol  5 mg Oral Daily   famotidine   20 mg Oral Daily   furosemide   80 mg Oral Daily   insulin  aspart  0-5 Units Subcutaneous QHS   insulin  aspart  0-9 Units Subcutaneous TID WC   levothyroxine   25 mcg Oral Daily   loperamide  2 mg Oral BID   LORazepam   0.5 mg Oral QHS   metoprolol  succinate  25 mg Oral Daily   mexiletine  300 mg Oral BID   nystatin    Topical BID   sacubitril -valsartan   1 tablet Oral BID   sertraline   25 mg Oral Daily     Thank you for allowing pharmacy to be a part of this patient's care.   Tobi Groesbeck C Vandana Haman, PharmD 05/26/2024 7:45 PM  **Pharmacist phone directory can be found on [email or web address] listed under [facility]**

## 2024-05-27 DIAGNOSIS — R197 Diarrhea, unspecified: Secondary | ICD-10-CM | POA: Diagnosis not present

## 2024-05-27 DIAGNOSIS — E860 Dehydration: Secondary | ICD-10-CM

## 2024-05-27 DIAGNOSIS — R112 Nausea with vomiting, unspecified: Secondary | ICD-10-CM

## 2024-05-27 DIAGNOSIS — N179 Acute kidney failure, unspecified: Secondary | ICD-10-CM | POA: Diagnosis not present

## 2024-05-27 LAB — CBC WITH DIFFERENTIAL/PLATELET
Abs Immature Granulocytes: 0.09 K/uL — ABNORMAL HIGH (ref 0.00–0.07)
Basophils Absolute: 0 K/uL (ref 0.0–0.1)
Basophils Relative: 0 %
Eosinophils Absolute: 0.1 K/uL (ref 0.0–0.5)
Eosinophils Relative: 1 %
HCT: 49.3 % — ABNORMAL HIGH (ref 36.0–46.0)
Hemoglobin: 16.1 g/dL — ABNORMAL HIGH (ref 12.0–15.0)
Immature Granulocytes: 1 %
Lymphocytes Relative: 24 %
Lymphs Abs: 2.8 K/uL (ref 0.7–4.0)
MCH: 29.8 pg (ref 26.0–34.0)
MCHC: 32.7 g/dL (ref 30.0–36.0)
MCV: 91.3 fL (ref 80.0–100.0)
Monocytes Absolute: 1.5 K/uL — ABNORMAL HIGH (ref 0.1–1.0)
Monocytes Relative: 13 %
Neutro Abs: 7 K/uL (ref 1.7–7.7)
Neutrophils Relative %: 61 %
Platelets: 68 K/uL — ABNORMAL LOW (ref 150–400)
RBC: 5.4 MIL/uL — ABNORMAL HIGH (ref 3.87–5.11)
RDW: 15.9 % — ABNORMAL HIGH (ref 11.5–15.5)
WBC: 11.5 K/uL — ABNORMAL HIGH (ref 4.0–10.5)
nRBC: 0 % (ref 0.0–0.2)

## 2024-05-27 LAB — GLUCOSE, CAPILLARY
Glucose-Capillary: 117 mg/dL — ABNORMAL HIGH (ref 70–99)
Glucose-Capillary: 117 mg/dL — ABNORMAL HIGH (ref 70–99)
Glucose-Capillary: 135 mg/dL — ABNORMAL HIGH (ref 70–99)
Glucose-Capillary: 135 mg/dL — ABNORMAL HIGH (ref 70–99)
Glucose-Capillary: 179 mg/dL — ABNORMAL HIGH (ref 70–99)

## 2024-05-27 LAB — URIC ACID: Uric Acid, Serum: 6.6 mg/dL (ref 2.5–7.1)

## 2024-05-27 LAB — PROTIME-INR
INR: 1.7 — ABNORMAL HIGH (ref 0.8–1.2)
Prothrombin Time: 20.4 s — ABNORMAL HIGH (ref 11.4–15.2)

## 2024-05-27 LAB — URINALYSIS, ROUTINE W REFLEX MICROSCOPIC
Bilirubin Urine: NEGATIVE
Glucose, UA: >=500 mg/dL — AB
Ketones, ur: NEGATIVE mg/dL
Nitrite: NEGATIVE
Protein, ur: NEGATIVE mg/dL
RBC / HPF: >50 RBC/hpf (ref 0–5)
Specific Gravity, Urine: 1.005 (ref 1.005–1.030)
WBC, UA: >50 WBC/hpf (ref 0–5)
pH: 5 (ref 5.0–8.0)

## 2024-05-27 LAB — BASIC METABOLIC PANEL WITH GFR
Anion gap: 9 (ref 5–15)
BUN: 13 mg/dL (ref 8–23)
CO2: 21 mmol/L — ABNORMAL LOW (ref 22–32)
Calcium: 7.7 mg/dL — ABNORMAL LOW (ref 8.9–10.3)
Chloride: 103 mmol/L (ref 98–111)
Creatinine, Ser: 1.05 mg/dL — ABNORMAL HIGH (ref 0.44–1.00)
GFR, Estimated: 53 mL/min — ABNORMAL LOW (ref >60)
Glucose, Bld: 95 mg/dL (ref 70–99)
Potassium: 3.8 mmol/L (ref 3.5–5.1)
Sodium: 133 mmol/L — ABNORMAL LOW (ref 135–145)

## 2024-05-27 LAB — RETICULOCYTES
Immature Retic Fract: 13.3 % (ref 2.3–15.9)
RBC.: 5.38 MIL/uL — ABNORMAL HIGH (ref 3.87–5.11)
Retic Count, Absolute: 168.4 K/uL (ref 19.0–186.0)
Retic Ct Pct: 3.1 % (ref 0.4–3.1)

## 2024-05-27 LAB — LACTATE DEHYDROGENASE: LDH: 309 U/L — ABNORMAL HIGH (ref 105–235)

## 2024-05-27 LAB — SEDIMENTATION RATE: Sed Rate: 3 mm/h (ref 0–22)

## 2024-05-27 MED ORDER — LOPERAMIDE HCL 2 MG PO CAPS
2.0000 mg | ORAL_CAPSULE | Freq: Four times a day (QID) | ORAL | Status: DC
  Administered 2024-05-27 – 2024-05-28 (×4): 2 mg via ORAL
  Filled 2024-05-27 (×4): qty 1

## 2024-05-27 MED ORDER — MUPIROCIN 2 % EX OINT
1.0000 | TOPICAL_OINTMENT | Freq: Two times a day (BID) | CUTANEOUS | Status: AC
  Administered 2024-05-27 – 2024-06-01 (×10): 1 via NASAL
  Filled 2024-05-27 (×2): qty 22

## 2024-05-27 MED ORDER — COLESTIPOL HCL 1 G PO TABS
1.0000 g | ORAL_TABLET | Freq: Two times a day (BID) | ORAL | Status: DC
  Administered 2024-05-27 – 2024-05-28 (×2): 1 g via ORAL
  Filled 2024-05-27 (×3): qty 1

## 2024-05-27 MED ORDER — LORAZEPAM 1 MG PO TABS
0.5000 mg | ORAL_TABLET | Freq: Every day | ORAL | Status: DC
  Administered 2024-05-27 – 2024-06-03 (×8): 0.5 mg via ORAL
  Filled 2024-05-27 (×9): qty 1

## 2024-05-27 MED ORDER — SODIUM CHLORIDE 0.9 % IV BOLUS
500.0000 mL | Freq: Once | INTRAVENOUS | Status: AC
  Administered 2024-05-27: 500 mL via INTRAVENOUS

## 2024-05-27 MED ORDER — CEFTRIAXONE SODIUM 1 G IJ SOLR
1.0000 g | INTRAMUSCULAR | Status: AC
  Administered 2024-05-27 – 2024-05-29 (×3): 1 g via INTRAVENOUS
  Filled 2024-05-27 (×3): qty 10

## 2024-05-27 MED ORDER — CHLORHEXIDINE GLUCONATE CLOTH 2 % EX PADS
6.0000 | MEDICATED_PAD | Freq: Every day | CUTANEOUS | Status: AC
  Administered 2024-05-27 – 2024-05-31 (×5): 6 via TOPICAL

## 2024-05-27 NOTE — Progress Notes (Addendum)
 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 Antony VVS HFrEF NYHA class III-->HF MR EF 60-65% in 2023 on GDMT CAD based on coronary CTA/cath however 2022 showing only luminal irregularities Smoker till 2022 OSA on CPAP CKD 3B Hypothyroidism followed by endocrinology  Admitted from [facility] ED nausea vomiting weakness + diarrhea in the setting of recent?  Prescription Mounjaro  Chronology  11/4-[facility] ED emergency room evaluation-volume depletion dehydration troponin 26 received IV fluid bolus Zofran  Eliquis  and subsequently because was tolerating p.o. went home-CT scan at the time no acute process 11/6 evaluated by PCP and promptly sent back to the ED-found to have volume depletion again bolus IV fluid 500 cc Reglan Benadryl etc. given for creatinine up from 1.4-->2.25-no chest pain despite troponin 54 A-fib on EKG--PPM to be interrogated 11/7 EP consulted because of runs of VT 11/11 because of recurrent diarrhea in the setting of several ED visits consulted GI   Pertinent imaging/studies till date  11/6 CXR 1 view cardiomegaly diffuse bronchitic changes   Assessment  & Plan :    ATN causing AKI superimposed on CKD 3B from nausea vomiting Transient urinary retention Urinary retention/ATN resolved Diarrheal illness-up to 6-8 episodes at home Confounding Mounjaro prior to  admission even though was taking regularly at home C. difficile pathogen panel negative Appreciate GI guidance-they added Imodium to 4 times daily and colestipol 1 g twice daily to see if this slows down her diarrhea  fecal calprotectin ESR CRP pending at this time?  Need for inpatient colonoscopy (family history IBD) Persistent VT in the setting of electrolyte abnormalities Continues amiodarone  200 daily, metoprolol  XL 25 and Mexitil  300 twice daily---in A-fib without any further VT PPM adjusted by EP 11/10 HFmrEF EF improved recently based on June 2025 echo currently 70-75% History of VT/A-fib-on amiodarone  + Eliquis  [Lasix  80 twice daily Inspra  25 Entresto  twice daily at home] Resumed lower dose lasix  80 od, Entresto  1 bid--monitor labs--hold Inspra  Resume Farxiga  10 at discharge--slow resumption diuretics as  ? 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 Baseline usually in 160s Etiology unclear-drug induced?  Stopped the Seroquel Smear overall neg--LDH, elevated uric  acid negative-haptoglobin pending Scenario discussed with Dr. Conchita feels  this is reequilibration secondary to hospitalization acute illness and does not feel that this is anything beyond probable cirrhosis-does not recommend at ADAMTS or further workup for TTP/HUS  Daughter on telephone this afternoon but did not reach her at [phone] Expect patient will be inpatient until we can figure out next steps She has not been out of bed really since admission so I will get mobility team to prioritize seeing her  Data Reviewed today:   Sodium 133 potassium 3.8 BUN/creatinine 13/1.0 uric acid 6.6 LDH 309 WBC 11.5 hemoglobin 16 platelet 68 sed rate 3 INR 1.7 UA as above  DVT prophylaxis: Eliquis   Status is: Observation Inpatient   Dispo/Global plan: d/c with HH in am  Time 30   Subjective:   Nursing informs sleepy this morning and lethargic she does not seem that way to me I discontinued her Seroquel gabapentin  for now-   Objective + exam Vitals:   05/26/24 1928 05/27/24 0000 05/27/24 0445 05/27/24 0755  BP: 104/63  131/81 103/69  Pulse: 72 71 79 74  Resp: 18  18 14   Temp: 97.8 F (36.6 C)  97.6 F (36.4 C) 98 F (36.7 C)  TempSrc:    Oral  SpO2: 97% 93% 96% 97%  Weight:      Height:       Filed Weights   05/22/24 2118  Weight: 113.4 kg     Examination:  EOMI NCAT no focal deficit no icterus no pallor no wheeze no rales no rhonchi CTAB no added sound Less tender in the abdomen today mainly on left side No rebound no guarding No specific significant edema S1-S2 no murmur Not on oxygen Chest is clear I examined her bottom and she had a little bit of greenish loose stool  Scheduled Meds:  amiodarone   200 mg Oral Daily   apixaban   5 mg Oral BID   Chlorhexidine  Gluconate Cloth  6 each Topical Daily   colestipol  1 g Oral BID   dapagliflozin  propanediol  5 mg Oral Daily   famotidine   20 mg Oral Daily   furosemide   80 mg Oral Daily   insulin  aspart  0-5 Units Subcutaneous QHS   insulin  aspart  0-9 Units Subcutaneous TID WC    levothyroxine   25 mcg Oral Daily   loperamide  2 mg Oral QID   LORazepam   0.5 mg Oral QHS   metoprolol  succinate  25 mg Oral Daily   mexiletine  300 mg Oral BID   mupirocin  ointment  1 Application Nasal BID   nystatin    Topical BID   sacubitril -valsartan   1 tablet Oral BID   Continuous Infusions: acetaminophen , guaiFENesin -dextromethorphan, mouth rinse  Jai-Gurmukh Dalores Weger, MD  [facility]

## 2024-05-27 NOTE — Telephone Encounter (Signed)
 Noted patient has been inpatient at [facility] since 11/6 and Dr Inocencio is aware.

## 2024-05-27 NOTE — Progress Notes (Signed)
 EP Follow UP   Confirmed with patient that we are planning for placement of new RV lead on 06/05/24 as previously planned.  EP is working on an appt in clinic to be seen before her device placement to review clinical status.  From EP standpoint, pt ok for discharge.      Daphne Barrack, NP-C, AGACNP-BC [facility] - Electrophysiology  05/27/2024, 10:23 AM

## 2024-05-27 NOTE — Progress Notes (Signed)
   05/27/24 2045  BiPAP/CPAP/SIPAP  BiPAP/CPAP/SIPAP Pt Type Adult  BiPAP/CPAP/SIPAP Resmed  Mask Type Full face mask  Mask Size Medium  PEEP 5 cmH20  FiO2 (%) 21 %  Patient Home Machine No  Patient Home Mask No  Patient Home Tubing No  Auto Titrate No  BiPAP/CPAP /SiPAP Vitals  Pulse Rate 77  SpO2 95 %  Bilateral Breath Sounds Clear;Diminished

## 2024-05-27 NOTE — TOC CM/SW Note (Signed)
 Transition of Care [facility]) - Inpatient Brief Assessment   Patient Details  Name: Heidi Stark MRN: [id number] Date of Birth: Feb 25, 1941  Transition of Care [facility]) CM/SW Contact:    Tom-Johnson, Harvest Muskrat, RN Phone Number: 05/27/2024, 1:35 PM   Clinical Narrative:  Patient presented  to the ED with N/V/D, poor Oral intake, Generalized Weakness and Dizziness. Admitted with AKI and complication of ICD RV Lead Malfunction.  CT Abdomen negative for acute abnormality. Has hx of HFrEF, VT, AF, HTN, T2DM, A-Fib on Eliquis  and Amiodarone , Mounjaro for DM and Obesity.   GI and Electrophysiology following.   CM spoke with patient and daughter at bedside about needs for post hospital transition. Patient lives with daughter, Heidi Stark and her husband, has two supportive children. Heidi Stark is patient's primary caregiver. Patient has all necessary DME's at home.  PCP is Heidi Stark, Heidi SAUNDERS, DO and uses [facility] on [address]   Home health recommended, patient states she was active with [facility] before and would like to uses their services again. CM called in referral to [facility] and Darleene noted acceptance, info on AVS.   Patient not Medically ready for discharge.  CM will continue to follow as patient progresses with care towards discharge.             Transition of Care Asessment: Insurance and Status: Insurance coverage has been reviewed Patient has primary care physician: Yes Home environment has been reviewed: Yes Prior level of function:: Modified Independent Prior/Current Home Services: No current home services Social Drivers of Health Review: SDOH reviewed no interventions necessary Readmission risk has been reviewed: Yes Transition of care needs: transition of care needs identified, TOC will continue to follow

## 2024-05-27 NOTE — Progress Notes (Signed)
 Ms. Zitlali had an eventful day. She started this morning very lethargic and drowsy, light and position was encouraged for alertness. Her daughter arrived bedside today and was bale to speak with GI provider and Dr. Royal via phone. Interventions completed: Complete bed change, prevention sacral foam placed, foam placed to established stage 2 wound, CHG and completed bath, In/Out catheterization with of urine, UA/UC sent, new purwick placed, antifungal and medicated powder placed, therapeutic NS Bolus for hypotension (Dr. Royal notified and order placed), Rochephin started for UTI. Stool sample sent just prior to end of shift.   On end of shift, patient is now alert, appropriate color, normotensive, resting. Ms. Raenette does endorse lightheadedness.   Please utilize this note for shift assessment (by exception) for this RN   Doreene Forrey

## 2024-05-27 NOTE — Consult Note (Addendum)
 Consultation Note   Referring Provider:   [facility] PCP: Antonio Meth, Jamee SAUNDERS, DO Primary Gastroenterologist:    Lupita Commander, MD   Reason for Consultation:  Diarrhea DOA: 05/22/2024         Hospital Day: 6   ASSESSMENT    83 year old female admitted with chronic nausea and vomiting (Mounjaro contributing?) and a 2-week history of nonbloody diarrhea (mainly postprandial). Prior to 2 weeks ago her bowel movements were formed  /once daily on stool softener .  Negative C. difficile and GI pathogen panel.  Diarrhea does not seem to be related to any of her home medications.  Given relationship to p.o. intake diarrhea seems to be osmotic but secretory diarrhea not excluded.  Consider microscopic colitis, IBD (has family history of Crohn's)  AKI on CKD Resolved  Possible cirrhosis (based on ultrasound July 2025) If she has cirrhosis then would probably be secondary to [facility] and appears to be compensated  Thrombocytopenia (new this admission) Hospitalists spoke with Hematology who thinks thrombocytopenia might be related to acute illness .  Diminutive spleen on CT scan  Remote history of colon polyps Tubular adenoma in 2003.  No follow-up colonoscopy done  Paroxysmal Atrial Fibrillation  On Eliquis  ( not on hold)  Chronic systolic and diastolic heart failure EF 60-65% in 2023 . EF improved recently based on June 2025 echo ,currently 70-75%   Morbid obesity DM2 On Mounjaro  Cholelithiasis  Periumbilical hernia containing a short segment of transverse colon  COPD  Multiple other comorbidities: Obstructive sleep apnea on CPAP, HTN, PAD, Hx of DVT, hypothyroidism.  See PMH for any additional medical history  / medical problems   PLAN:   --Fecal calprotectin --ESR. CRP --Consider holding Mounjaro to see if nausea improves --Check INR though expect some elevation given eliquis  --Depending on clinical course / results of  above will consider colonoscopy (hopefully transverse colon in hernia will not be a problem). Also, will consider EGD at same time to rule out upper GI tract pathology given N/V   ATTENDING ADDENDUM I personally saw the patient and performed a substantive portion of this encounter (>50% time spent), including a complete performance of at least one of the key components (MDM, Hx and/or Exam), in conjunction with Vina Dasen.   83 year old female with comorbidities as outlined, on Mounjaro with chronic nausea and occasional vomiting, admitted with persistent diarrhea.  This has been going on for about 2 weeks so far, a few ED visits with dehydration.  Infectious etiology unlikely given negative GI pathogen panel and C. difficile.  No other new medications obvious to be causing this.  Incidentally noted with suspected cirrhosis on ultrasound and chronic thrombocytopenia.  If she does have cirrhosis she is considered to be compensated.  CT scan did not show any overt abnormalities.  Mild transaminitis noted.  She does have chronic nausea and with her bowel symptoms I would definitely hold Mounjaro for now as it is confounding the presentation.  She has been placed on Imodium and states that has helped somewhat.  Agree with fecal calprotectin and inflammatory markers.  If fecal calprotectin is significantly high then would consider colonoscopy.  In the interim, would put her on a bit more aggressive of a  bowel regimen.  Discussed starting empiric Colestid with her, 1 g twice daily.  Can increase Imodium slightly as well and see how she does.  She denies any nausea or vomiting recently. Should this be an issue can give Zofran  as needed.  We will see how she does, if she continues to have severe diarrhea and does not improve with escalation of medical therapy, then we will need to consider colonoscopy as inpatient.  Ventral hernia noted containing small segment of transverse colon which could make  colonoscopy technically more challenging.  Discussed the situation with her and she understands and agrees.  Will check on her tomorrow, call in in the interim with any questions  Marcey Naval, MD [facility]           HPI   Patient was admitted on 05/23/2024 for nausea, vomiting, and diarrhea   She had been to the emergency room twice this month for the same symptoms.  Patient has chronic nausea and vomiting which has been managed by her PCP.  Of note she has been taking Mounjaro for quite some time.  The diarrhea started approximately 2 weeks ago, prior to that her bowel movements were normal on stool softeners.  She has not recently started any new medications to account for the diarrhea nor has she made any changes in dosages of her home medications.  No dietary changes to account for the diarrhea .  She describes having up to 6 loose stools a day.  Daughter has seen the diarrhea as has the Hospitalist and there has not been any visible blood.  Patient herself does not examine the stool.  Diarrhea mainly occurs after eating.  No nocturnal diarrhea to speak of.  Except for a couple of nights ago the diarrhea has not been associated with any abdominal pain.  Started on Imodium inpatient which really has not helped.  She had 6 loose bowel movements yesterday . While in the hospital she has continued to have some episodes of nausea but no vomiting.  She is unclear whether her GI symptoms have been associated with any weight loss.                                       Pertinent studies This admission her white count has been mildly elevated at 11.5 she was found to have AKI overlying her CKD.   No acute findings on noncontrast CT scan of the abdomen.  Stool pathogen panel and C. difficile negative.   Ultrasound in July suggest suggested cirrhosis.  No history of alcohol use.  AST in the 50s /ALT 30s normal bilirubin and normal alkaline phosphatase   Pertinent GI Studies    Colonoscopy 2003 Prep was only fair compromising the exam Multiple small polyps but only 1 appeared to be an adenoma Path: Ascending colon polyp: Tubular adenoma    Recent Labs    05/25/24 0805 05/26/24 1616  PROT 5.4* 5.5*  ALBUMIN 2.3* 2.4*  AST 92* 58*  ALT 36 32  ALKPHOS 44 42  BILITOT 1.3* 1.0   Recent Labs    05/26/24 0522 05/26/24 1616 05/27/24 0450  WBC 11.5* 10.5 11.5*  HGB 16.0* 16.0* 16.1*  HCT 48.8* 49.7* 49.3*  MCV 91.9 92.2 91.3  PLT 65* 68* 68*   Recent Labs    05/26/24 0522 05/26/24 1616 05/27/24 0450  NA 138 136 133*  K 5.5* 4.0 3.8  CL 105 104 103  CO2 21* 20* 21*  GLUCOSE 98 114* 95  BUN 17 14 13   CREATININE 1.15* 1.05* 1.05*  CALCIUM  8.2* 7.9* 7.7*     DG CHEST PORT 1 VIEW CLINICAL DATA:  Pneumonia.  EXAM: PORTABLE CHEST 1 VIEW  COMPARISON:  Chest radiograph dated 05/22/2024  FINDINGS: No focal consolidation, pleural effusion, pneumothorax. Mild cardiomegaly. Left pectoral AICD device. Atherosclerotic calcification of the aorta. No acute osseous pathology.  IMPRESSION: 1. No active disease. 2. Mild cardiomegaly.  Electronically Signed   By: Vanetta Chou M.D.   On: 05/26/2024 16:03    Past Medical History:  Diagnosis Date   Arthritis    hands, wrists (11/07/2016)   CHF (congestive heart failure) (HCC)    Chronic lower back pain    Constipation    DVT (deep venous thrombosis) (HCC)    History of blood transfusion 1990   related to OR   History of kidney stones    Hyperlipidemia    Hypertension    Joint pain    Lower extremity edema    OSA on CPAP    Persistent atrial fibrillation (HCC)    Pneumonia    several times (11/07/2016)   Type II diabetes mellitus (HCC)     Past Surgical History:  Procedure Laterality Date   ATRIAL FIBRILLATION ABLATION N/A 11/07/2016   Procedure: Atrial Fibrillation Ablation;  Surgeon: Will Gladis Norton, MD;  Location: [facility] INVASIVE CV LAB;  Service: Cardiovascular;   Laterality: N/A;   BACK SURGERY     CARDIAC CATHETERIZATION     CARDIOVERSION N/A 08/11/2016   Procedure: CARDIOVERSION;  Surgeon: Ezra GORMAN Shuck, MD;  Location: [facility] ENDOSCOPY;  Service: Cardiovascular;  Laterality: N/A;   CARDIOVERSION N/A 12/03/2017   Procedure: CARDIOVERSION;  Surgeon: Shuck Ezra GORMAN, MD;  Location: [facility] ENDOSCOPY;  Service: Cardiovascular;  Laterality: N/A;   CARDIOVERSION N/A 05/02/2018   Procedure: CARDIOVERSION;  Surgeon: Mona Vinie BROCKS, MD;  Location: [facility] ENDOSCOPY;  Service: Cardiovascular;  Laterality: N/A;   CARDIOVERSION N/A 06/23/2020   Procedure: CARDIOVERSION;  Surgeon: Hobart Powell BRAVO, MD;  Location: [facility] ENDOSCOPY;  Service: Cardiovascular;  Laterality: N/A;   CARDIOVERSION N/A 10/11/2020   Procedure: CARDIOVERSION;  Surgeon: Shuck Ezra GORMAN, MD;  Location: [facility] ENDOSCOPY;  Service: Cardiovascular;  Laterality: N/A;   CARPAL TUNNEL RELEASE     CATARACT EXTRACTION W/ INTRAOCULAR LENS  IMPLANT, BILATERAL Bilateral    COLON SURGERY  1990   vein graft and colon repair after nicked artery with back surgert   COLONOSCOPY     CORONARY ANGIOGRAPHY N/A 09/08/2020   Procedure: CORONARY ANGIOGRAPHY;  Surgeon: Shuck Ezra GORMAN, MD;  Location: [facility] INVASIVE CV LAB;  Service: Cardiovascular;  Laterality: N/A;   CYSTECTOMY     between bladder and kidneys   GANGLION CYST EXCISION Left    ICD IMPLANT N/A 09/15/2020   Procedure: ICD IMPLANT;  Surgeon: Norton Soyla Gladis, MD;  Location: [facility] INVASIVE CV LAB;  Service: Cardiovascular;  Laterality: N/A;   KNEE CARTILAGE SURGERY Right 1960s   LUMBAR DISC SURGERY  1990   REPAIR ILIAC ARTERY  1990   vein graft and colon repair after nicked artery with back surgert   TEE WITHOUT CARDIOVERSION N/A 10/31/2013   Procedure: TRANSESOPHAGEAL ECHOCARDIOGRAM (TEE);  Surgeon: Ezra GORMAN Shuck, MD;  Location: [facility] ENDOSCOPY;  Service: Cardiovascular;  Laterality: N/A;   TUBAL LIGATION      Family History  Problem Relation Age of Onset    Diabetes Mother  Hypertension Mother    Cancer Mother    Obesity Mother    Diabetes Father    Obesity Father    Diabetes Other    Melanoma Other    Factor V Leiden deficiency Daughter     Prior to Admission medications   Medication Sig Start Date End Date Taking? Authorizing Provider  amiodarone  (PACERONE ) 200 MG tablet TAKE 1 TABLET BY MOUTH EVERY DAY 08/23/23  Yes Rolan Ezra RAMAN, MD  apixaban  (ELIQUIS ) 5 MG TABS tablet TAKE 1 TABLET BY MOUTH TWICE A DAY 03/06/24  Yes McLean, Dalton S, MD  clotrimazole (LOTRIMIN) 1 % cream Apply to affected area 2 times daily 05/21/24  Yes Beverley Doffing A, PA-C  dapagliflozin  propanediol (FARXIGA ) 10 MG TABS tablet TAKE 1 TABLET BY MOUTH EVERY DAY BEFORE BREAKFAST 04/23/24  Yes Rolan Ezra RAMAN, MD  ENTRESTO  24-26 MG TAKE 1 TABLET BY MOUTH TWICE A DAY 04/01/24  Yes Rolan Ezra RAMAN, MD  eplerenone  (INSPRA ) 25 MG tablet Take 1 tablet (25 mg total) by mouth daily. 03/20/24  Yes Rolan Ezra RAMAN, MD  famotidine  (PEPCID ) 20 MG tablet Take 1 tablet (20 mg total) by mouth daily. 03/10/24  Yes Antonio Meth, Jamee R, DO  fenofibrate  (TRICOR ) 145 MG tablet TAKE 1 TABLET BY MOUTH EVERY DAY 01/22/23  Yes Rolan Ezra RAMAN, MD  furosemide  (LASIX ) 40 MG tablet Take 80 mg by mouth 2 (two) times daily. 11/15/23  Yes [provider]  gabapentin  (NEURONTIN ) 300 MG capsule Take 300 mg by mouth 2 (two) times daily as needed (pain). 07/31/22  Yes [provider]  HUMALOG  MIX 75/25 KWIKPEN (75-25) 100 UNIT/ML KwikPen Take 10 units with morning meal and 10 units with evening meal Patient taking differently: Inject 0-30 Units into the skin daily. If BS is  100 or < = No Insulin  100 - 140 = 10 Units Over 140 = 30 Units in the Morning and 20 Units at Bedtime 12/27/23  Yes Rojelio Nest, DO  levothyroxine  (SYNTHROID ) 25 MCG tablet Take 25 mcg by mouth daily. 04/06/24  Yes [provider]  LIVALO  4 MG TABS TAKE 1 TABLET BY MOUTH EVERY DAY 03/06/24  Yes Rolan Ezra RAMAN, MD  LORazepam  (ATIVAN ) 0.5 MG tablet Take 1 tablet (0.5 mg total) by mouth at bedtime. 03/10/24  Yes Antonio Meth Jamee R, DO  metoprolol  succinate (TOPROL -XL) 25 MG 24 hr tablet TAKE 1 TABLET BY MOUTH EVERYDAY AT BEDTIME Patient taking differently: Take 25 mg by mouth daily. 04/23/23  Yes Hayes Lander L, NP  mexiletine (MEXITIL ) 150 MG capsule TAKE 2 CAPSULES (300 MG TOTAL) BY MOUTH 2 (TWO) TIMES DAILY. 05/10/23  Yes Camnitz, Will Gladis, MD  MOUNJARO 2.5 MG/0.5ML Pen Inject 2.5 mg into the skin once a week. Thursdays   Yes [provider]  Multiple Vitamin (MULTIVITAMIN WITH MINERALS) TABS tablet Take 1 tablet by mouth daily.   Yes [provider]  ondansetron  (ZOFRAN -ODT) 4 MG disintegrating tablet Take 1 tablet (4 mg total) by mouth every 6 (six) hours as needed for nausea or vomiting. 05/21/24  Yes Prosperi, Christian H, PA-C  potassium chloride  (MICRO-K ) 10 MEQ CR capsule TAKE 1 CAPSULE BY MOUTH 2 TIMES DAILY. 05/12/24  Yes Bensimhon, Toribio SAUNDERS, MD  sertraline  (ZOLOFT ) 25 MG tablet Take 1 tablet (25 mg total) by mouth daily. 03/10/24  Yes Antonio Meth Jamee R, DO  VASCEPA  1 g capsule TAKE 2 CAPSULES BY MOUTH TWICE A DAY 10/10/23  Yes Rolan Ezra RAMAN, MD  acetaminophen  (TYLENOL ) 500 MG tablet Take 500 mg by mouth every 6 (six) hours as needed for moderate pain (pain score 4-6) or headache.    [provider]  BD PEN NEEDLE NANO 2ND GEN 32G X 4 MM MISC See admin instructions. 09/24/21   [provider]  ondansetron  (ZOFRAN ) 8 MG tablet Take 1 tablet (8 mg total) by mouth every 8 (eight) hours as needed for nausea or vomiting. Patient not taking: Reported on 05/23/2024 05/02/24   Antonio Meth, Jamee SAUNDERS, DO  ONETOUCH VERIO test strip 1 each 3 (three) times daily. 10/09/21   [provider]    Current Facility-Administered Medications  Medication Dose Route Frequency Provider Last Rate Last Admin   acetaminophen  (TYLENOL ) tablet 500 mg  500 mg Oral  Q6H PRN Samtani, Jai-Gurmukh, MD       amiodarone  (PACERONE ) tablet 200 mg  200 mg Oral Daily Emokpae, Ejiroghene E, MD   200 mg at 05/27/24 9147   apixaban  (ELIQUIS ) tablet 5 mg  5 mg Oral BID Emokpae, Ejiroghene E, MD   5 mg at 05/27/24 9147   dapagliflozin  propanediol (FARXIGA ) tablet 5 mg  5 mg Oral Daily Samtani, Jai-Gurmukh, MD   5 mg at 05/27/24 9143   famotidine  (PEPCID ) tablet 20 mg  20 mg Oral Daily Samtani, Jai-Gurmukh, MD   20 mg at 05/27/24 9147   furosemide  (LASIX ) tablet 80 mg  80 mg Oral Daily Samtani, Jai-Gurmukh, MD   80 mg at 05/27/24 9146   guaiFENesin -dextromethorphan (ROBITUSSIN DM) 100-10 MG/5ML syrup 5 mL  5 mL Oral Q4H PRN Samtani, Jai-Gurmukh, MD       insulin  aspart (novoLOG ) injection 0-5 Units  0-5 Units Subcutaneous QHS Emokpae, Ejiroghene E, MD       insulin  aspart (novoLOG ) injection 0-9 Units  0-9 Units Subcutaneous TID WC Emokpae, Ejiroghene E, MD   1 Units at 05/26/24 1100   levothyroxine  (SYNTHROID ) tablet 25 mcg  25 mcg Oral Daily Emokpae, Ejiroghene E, MD   25 mcg at 05/27/24 0533   loperamide (IMODIUM) capsule 2 mg  2 mg Oral QID Samtani, Jai-Gurmukh, MD       metoprolol  succinate (TOPROL -XL) 24 hr tablet 25 mg  25 mg Oral Daily Samtani, Jai-Gurmukh, MD   25 mg at 05/27/24 9147   mexiletine (MEXITIL ) capsule 300 mg  300 mg Oral BID Samtani, Jai-Gurmukh, MD   300 mg at 05/27/24 9143   nystatin  (MYCOSTATIN /NYSTOP ) topical powder   Topical BID Samtani, Jai-Gurmukh, MD   Given at 05/27/24 0857   Oral care mouth rinse  15 mL Mouth Rinse PRN Samtani, Jai-Gurmukh, MD       sacubitril -valsartan  (ENTRESTO ) 24-26 mg per tablet  1 tablet Oral BID Samtani, Jai-Gurmukh, MD   1 tablet at 05/27/24 0856    Allergies as of 05/22/2024 - Review Complete 05/22/2024  Allergen Reaction Noted   Neomycin-bacitracin zn-polymyx Rash 01/27/2009   Sulfa antibiotics Diarrhea and Nausea And Vomiting 12/21/2021   Ciprofloxacin  Hives, Itching, and Rash 05/22/2016   Spironolactone  Rash  08/21/2016    Social History   Socioeconomic History   Marital status: Married    Spouse name: [name]   Number of children: 2   Years of education: Not on file   Highest education level: Not on file  Occupational History   Occupation: Retired  Tobacco Use   Smoking status: Former    Current packs/day: 0.00    Average packs/day: 1 pack/day for 50.0 years (50.0 ttl pk-yrs)    Types:  Cigarettes    Start date: 08/17/1970    Quit date: 08/17/2020    Years since quitting: 3.7   Smokeless tobacco: Never   Tobacco comments:    Quit 2022. [employer]  Vaping Use   Vaping status: Never Used  Substance and Sexual Activity   Alcohol use: No    Alcohol/week: 0.0 standard drinks of alcohol   Drug use: No   Sexual activity: Not on file  Other Topics Concern   Not on file  Social History Narrative   Married x 67 years.    1 son and 1 daughter.   1 grandson.   Social Drivers of Health   Financial Resource Strain: Medium Risk (10/13/2021)   Overall Financial Resource Strain (CARDIA)    Difficulty of Paying Living Expenses: Somewhat hard  Food Insecurity: No Food Insecurity (05/23/2024)   Hunger Vital Sign    Worried About Running Out of Food in the Last Year: Never true    Ran Out of Food in the Last Year: Never true  Transportation Needs: No Transportation Needs (05/23/2024)   PRAPARE - Administrator, Civil Service (Medical): No    Lack of Transportation (Non-Medical): No  Physical Activity: Insufficiently Active (10/13/2021)   Exercise Vital Sign    Days of Exercise per Week: 2 days    Minutes of Exercise per Session: 20 min  Stress: No Stress Concern Present (10/13/2021)   [employer] of Occupational Health - Occupational Stress Questionnaire    Feeling of Stress : Not at all  Social Connections: Moderately Integrated (05/23/2024)   Social Connection and Isolation Panel    Frequency of Communication with Friends and Family: More than three times a week    Frequency of  Social Gatherings with Friends and Family: More than three times a week    Attends Religious Services: 1 to 4 times per year    Active Member of [employer] or Organizations: No    Attends Banker Meetings: 1 to 4 times per year    Marital Status: Widowed  Intimate Partner Violence: Not At Risk (05/23/2024)   Humiliation, Afraid, Rape, and Kick questionnaire    Fear of Current or Ex-Partner: No    Emotionally Abused: No    Physically Abused: No    Sexually Abused: No     Code Status   Code Status: Limited: Do not attempt resuscitation (DNR) -DNR-LIMITED -Do Not Intubate/DNI   Review of Systems: All systems reviewed and negative except where noted in HPI.  Physical Exam: Vital signs in last 24 hours: Temp:  [97.6 F (36.4 C)-98 F (36.7 C)] 98 F (36.7 C) (11/11 0755) Pulse Rate:  [71-79] 74 (11/11 0755) Resp:  [14-18] 14 (11/11 0755) BP: (93-131)/(59-81) 103/69 (11/11 0755) SpO2:  [93 %-97 %] 97 % (11/11 0755) FiO2 (%):  [21 %] 21 % (11/11 0000) Last BM Date : 05/26/24  General:  Pleasant female in NAD Psych:  Cooperative. Normal mood and affect Eyes: Pupils equal Ears:  Normal auditory acuity Nose: No deformity, discharge or lesions Neck:  Supple, no masses felt Lungs:  Clear to auscultation.  Heart:  Regular rate, regular rhythm.  Abdomen:  Soft, nondistended, nontender, active bowel sounds, no masses felt Rectal :  Deferred Msk: Symmetrical without gross deformities.  Neurologic:  Alert, oriented, grossly normal neurologically Extremities : No edema Skin:  Intact without significant lesions.    Intake/Output from previous day: 11/10 0701 - 11/11 0700 In: 240 [P.O.:240] Out: 400 [Urine:400] Intake/Output  this shift:  No intake/output data recorded.   Vina Dasen, NP-C   05/27/2024, 10:34 AM

## 2024-05-28 DIAGNOSIS — N179 Acute kidney failure, unspecified: Secondary | ICD-10-CM | POA: Diagnosis not present

## 2024-05-28 DIAGNOSIS — R112 Nausea with vomiting, unspecified: Secondary | ICD-10-CM | POA: Diagnosis not present

## 2024-05-28 DIAGNOSIS — R197 Diarrhea, unspecified: Secondary | ICD-10-CM | POA: Diagnosis not present

## 2024-05-28 LAB — BASIC METABOLIC PANEL WITH GFR
Anion gap: 8 (ref 5–15)
BUN: 15 mg/dL (ref 8–23)
CO2: 26 mmol/L (ref 22–32)
Calcium: 7.4 mg/dL — ABNORMAL LOW (ref 8.9–10.3)
Chloride: 101 mmol/L (ref 98–111)
Creatinine, Ser: 1.34 mg/dL — ABNORMAL HIGH (ref 0.44–1.00)
GFR, Estimated: 40 mL/min — ABNORMAL LOW (ref >60)
Glucose, Bld: 98 mg/dL (ref 70–99)
Potassium: 3.7 mmol/L (ref 3.5–5.1)
Sodium: 135 mmol/L (ref 135–145)

## 2024-05-28 LAB — CBC WITH DIFFERENTIAL/PLATELET
Abs Immature Granulocytes: 0.06 K/uL (ref 0.00–0.07)
Basophils Absolute: 0 K/uL (ref 0.0–0.1)
Basophils Relative: 0 %
Eosinophils Absolute: 0.1 K/uL (ref 0.0–0.5)
Eosinophils Relative: 1 %
HCT: 48.7 % — ABNORMAL HIGH (ref 36.0–46.0)
Hemoglobin: 15.8 g/dL — ABNORMAL HIGH (ref 12.0–15.0)
Immature Granulocytes: 1 %
Lymphocytes Relative: 23 %
Lymphs Abs: 2.5 K/uL (ref 0.7–4.0)
MCH: 30 pg (ref 26.0–34.0)
MCHC: 32.4 g/dL (ref 30.0–36.0)
MCV: 92.6 fL (ref 80.0–100.0)
Monocytes Absolute: 1.6 K/uL — ABNORMAL HIGH (ref 0.1–1.0)
Monocytes Relative: 15 %
Neutro Abs: 6.4 K/uL (ref 1.7–7.7)
Neutrophils Relative %: 60 %
Platelets: 87 K/uL — ABNORMAL LOW (ref 150–400)
RBC: 5.26 MIL/uL — ABNORMAL HIGH (ref 3.87–5.11)
RDW: 15.9 % — ABNORMAL HIGH (ref 11.5–15.5)
WBC: 10.8 K/uL — ABNORMAL HIGH (ref 4.0–10.5)
nRBC: 0 % (ref 0.0–0.2)

## 2024-05-28 LAB — GLUCOSE, CAPILLARY
Glucose-Capillary: 117 mg/dL — ABNORMAL HIGH (ref 70–99)
Glucose-Capillary: 119 mg/dL — ABNORMAL HIGH (ref 70–99)
Glucose-Capillary: 136 mg/dL — ABNORMAL HIGH (ref 70–99)
Glucose-Capillary: 147 mg/dL — ABNORMAL HIGH (ref 70–99)

## 2024-05-28 LAB — C-REACTIVE PROTEIN: CRP: 5.8 mg/dL — ABNORMAL HIGH (ref <1.0)

## 2024-05-28 MED ORDER — ONDANSETRON HCL 4 MG/2ML IJ SOLN
4.0000 mg | INTRAMUSCULAR | Status: DC | PRN
  Administered 2024-06-02: 4 mg via INTRAVENOUS
  Filled 2024-05-28: qty 2

## 2024-05-28 MED ORDER — GABAPENTIN 300 MG PO CAPS
300.0000 mg | ORAL_CAPSULE | Freq: Two times a day (BID) | ORAL | Status: DC
  Administered 2024-05-28 – 2024-06-04 (×15): 300 mg via ORAL
  Filled 2024-05-28 (×16): qty 1

## 2024-05-28 MED ORDER — SODIUM CHLORIDE 0.9 % IV BOLUS
500.0000 mL | Freq: Once | INTRAVENOUS | Status: AC
  Administered 2024-05-28: 500 mL via INTRAVENOUS

## 2024-05-28 MED ORDER — LOPERAMIDE HCL 2 MG PO CAPS
2.0000 mg | ORAL_CAPSULE | Freq: Four times a day (QID) | ORAL | Status: DC
  Administered 2024-05-28 – 2024-05-29 (×4): 2 mg via ORAL
  Filled 2024-05-28 (×5): qty 1

## 2024-05-28 MED ORDER — COLESTIPOL HCL 1 G PO TABS
2.0000 g | ORAL_TABLET | Freq: Two times a day (BID) | ORAL | Status: DC
  Administered 2024-05-28 – 2024-06-02 (×10): 2 g via ORAL
  Filled 2024-05-28 (×11): qty 2

## 2024-05-28 NOTE — Progress Notes (Addendum)
 PROGRESS NOTE  Heidi Stark  FMW:[id number] DOB: 11-Aug-1940 DOA: 05/22/2024 PCP: [name], DO   Brief Narrative: Patient is a 83 year old female with history of diabetes type 2, HFrEF, VT status post ICD [employer] placement taking amiodarone , cardioembolic splenic/renal infarcts, atrial fibrillation status post ablation, peripheral artery disease, coronary arterydisease, OSA on CPAP, CKD stage III, hypothyroidism who presented with nausea, vomiting, weakness, diarrhea.  Found to be dehydrated on presentation.  GI consulted.  EP were also following due to concern for VT. plan for colonoscopy  .Assessment & Plan:  Principal Problem:   AKI (acute kidney injury) Active Problems:   CKD (chronic kidney disease) stage 3, GFR 30-59 ml/min (HCC)   COPD (chronic obstructive pulmonary disease) (HCC)   Paroxysmal atrial fibrillation (HCC)   Obstructive sleep apnea   Type 2 diabetes mellitus with hyperlipidemia (HCC)   Essential hypertension   Hypothyroidism   Diarrhea   Encounter for assessment for deep vein thrombosis (DVT)   Diabetic nephropathy (HCC)   Nausea and vomiting   Dehydration  AKI on CKD stage IIIb: From volume depletion from vomiting, diarrhea.  Currently kidney function at baseline.  Diarrheal illness: GI following.  Taking Mounjaro at home.  C. difficile /gI pathogen negative.  Currently on Imodium, colestipol.  Fecal calprotectin pending.  GI might consider colonoscopy./EGD.  Continues to have loose stools  V. tach: Has ICD.  Takes amiodarone , metoprolol , Mexitil .  EP was consulted, PPM adjusted.  EP planning for placement of new RV lead on 06/05/2024 as an outpatient.  Paroxysmal A-fib: Remains in A-fib rhythm with controlled rate.  Continue metoprolol , amiodarone   HFmrEF: Improved EF as per recent echo in June 2025 of 70 to 75%.  Takes Lasix , Entresto , Farxiga , Inspra  at home  COPD: Currently not in exacerbation.  Continue bronchodilators as needed  Diabetes  type 2: .  Mounjaro will be discontinued.  A1c of 6.2.   Takes Farxiga , Mounjaro ,Humalog  10 units twice daily at home.  Hypothyroidism: Continue Synthyroid at 25 mcg.  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  Hepatitis panel negative.   Thrombocytopenia: Likely associated with history of  cirrhosis or acute illness.  Case was discussed with hematology  UTI: UA showed large leukocytes, no nitrates.  Urine culture pending.  On ceftriaxone .  Complains of some abdominal discomfort  Debility/deconditioning: PT recommended home health on discharge  Morbid obesity: BMI of  40.3        Wound 05/23/24 0632 Pressure Injury Buttocks Left Stage 2 -  Partial thickness loss of dermis presenting as a shallow open injury with a red, pink wound bed without slough. (Active)     Wound 05/23/24 0556 Pressure Injury Ankle Left;Posterior Stage 2 -  Partial thickness loss of dermis presenting as a shallow open injury with a red, pink wound bed without slough. (Active)    DVT prophylaxis: apixaban  (ELIQUIS ) tablet 5 mg     Code Status: Limited: Do not attempt resuscitation (DNR) -DNR-LIMITED -Do Not Intubate/DNI   Family Communication: Called daughter [name] twice,directly goes to voice mail  Patient status: Inpatient  Patient is from : Home  Anticipated discharge to: Home  Estimated DC date: After full workup   Consultants: Inpatient  Procedures:None yet  Antimicrobials:  Anti-infectives (From admission, onward)    Start     Dose/Rate Route Frequency Ordered Stop   05/27/24 1745  cefTRIAXone  (ROCEPHIN ) 1 g in sodium chloride  0.9 % 100 mL IVPB        1 g 200 mL/hr  over 30 Minutes Intravenous Every 24 hours 05/27/24 1657         Subjective: Patient seen and examined at bedside today.  Lying on bed.  Overall comfortable.  Not in distress.  Continues to have loose bowel movement.  Complains of some abdominal discomfort but abdomen is not tender or severely distended.  Tolerating  solid diet.  Objective: Vitals:   05/28/24 0428 05/28/24 0431 05/28/24 0532 05/28/24 0800  BP: (!) 93/57 (!) 95/44 91/71 (!) 102/53  Pulse: 72 62 74 76  Resp: 16   19  Temp:    (!) 97.4 F (36.3 C)  TempSrc:      SpO2: 95%  96% 96%  Weight:      Height:        Intake/Output Summary (Last 24 hours) at 05/28/2024 0810 Last data filed at 05/28/2024 0600 Gross per 24 hour  Intake 836.33 ml  Output 600 ml  Net 236.33 ml   Filed Weights   05/22/24 2118  Weight: 113.4 kg    Examination:  General exam: Overall comfortable, not in distress,morbidly obese HEENT: PERRL Respiratory system:  no wheezes or crackles  Cardiovascular system: irregularly irregular rhythm  Gastrointestinal system: Abdomen is nondistended, soft .Has mild generalized tenderness Central nervous system: Alert and oriented Extremities: No edema, no clubbing ,no cyanosis Skin: No rashes, no ulcers,no icterus     Data Reviewed: I have personally reviewed following labs and imaging studies  CBC: Recent Labs  Lab 05/25/24 0805 05/26/24 0522 05/26/24 1616 05/27/24 0450 05/28/24 0334  WBC 7.4 11.5* 10.5 11.5* 10.8*  NEUTROABS 3.9 6.9 6.4 7.0 6.4  HGB 15.6* 16.0* 16.0* 16.1* 15.8*  HCT 47.8* 48.8* 49.7* 49.3* 48.7*  MCV 91.6 91.9 92.2 91.3 92.6  PLT 61* 65* 68* 68* 87*   Basic Metabolic Panel: Recent Labs  Lab 05/23/24 0114 05/23/24 0604 05/25/24 0316 05/25/24 0805 05/26/24 0522 05/26/24 1616 05/27/24 0450 05/28/24 0334  NA  --    < >  --  137 138 136 133* 135  K  --    < >  --  5.0 5.5* 4.0 3.8 3.7  CL  --    < >  --  106 105 104 103 101  CO2  --    < >  --  20* 21* 20* 21* 26  GLUCOSE  --    < >  --  125* 98 114* 95 98  BUN  --    < >  --  22 17 14 13 15   CREATININE  --    < >  --  1.23* 1.15* 1.05* 1.05* 1.34*  CALCIUM   --    < >  --  7.9* 8.2* 7.9* 7.7* 7.4*  MG 2.0  --  2.1  --  2.3  --   --   --    < > = values in this interval not displayed.     Recent Results (from the past  240 hours)  Gastrointestinal Panel by PCR , Stool     Status: None   Collection Time: 05/23/24  2:38 AM   Specimen: Stool  Result Value Ref Range Status   Campylobacter species NOT DETECTED NOT DETECTED Final   Plesimonas shigelloides NOT DETECTED NOT DETECTED Final   Salmonella species NOT DETECTED NOT DETECTED Final   Yersinia enterocolitica NOT DETECTED NOT DETECTED Final   Vibrio species NOT DETECTED NOT DETECTED Final   Vibrio cholerae NOT DETECTED NOT DETECTED Final   Enteroaggregative E coli (  EAEC) NOT DETECTED NOT DETECTED Final   Enteropathogenic E coli (EPEC) NOT DETECTED NOT DETECTED Final   Enterotoxigenic E coli (ETEC) NOT DETECTED NOT DETECTED Final   Shiga like toxin producing E coli (STEC) NOT DETECTED NOT DETECTED Final   Shigella/Enteroinvasive E coli (EIEC) NOT DETECTED NOT DETECTED Final   Cryptosporidium NOT DETECTED NOT DETECTED Final   Cyclospora cayetanensis NOT DETECTED NOT DETECTED Final   Entamoeba histolytica NOT DETECTED NOT DETECTED Final   Giardia lamblia NOT DETECTED NOT DETECTED Final   Adenovirus F40/41 NOT DETECTED NOT DETECTED Final   Astrovirus NOT DETECTED NOT DETECTED Final   Norovirus GI/GII NOT DETECTED NOT DETECTED Final   Rotavirus A NOT DETECTED NOT DETECTED Final   Sapovirus (I, II, IV, and V) NOT DETECTED NOT DETECTED Final    Comment: Performed at [facility], [address] [place], [place] [address]  C Difficile Quick Screen w PCR reflex     Status: None   Collection Time: 05/23/24  2:38 AM   Specimen: Stool  Result Value Ref Range Status   C Diff antigen NEGATIVE NEGATIVE Final   C Diff toxin NEGATIVE NEGATIVE Final   C Diff interpretation No C. difficile detected.  Final    Comment: Performed at [facility], [address] [place], [place] [address]  MRSA Next Gen by PCR, Nasal     Status: Abnormal   Collection Time: 05/26/24 11:27 AM   Specimen: Nasal Mucosa; Nasal Swab  Result Value Ref Range Status   MRSA  by PCR Next Gen DETECTED (A) NOT DETECTED Final    Comment: RESULT CALLED TO, READ BACK BY AND VERIFIED WITH: RN PRECIOUS S ON 05/26/24 @ 1440 BY DRT (NOTE) The GeneXpert MRSA Assay (FDA approved for NASAL specimens only), is one component of a comprehensive MRSA colonization surveillance program. It is not intended to diagnose MRSA infection nor to guide or monitor treatment for MRSA infections. Test performance is not FDA approved in patients less than 17 years old. Performed at [facility], [address] [place], [place] [address]      Radiology Studies: DG CHEST PORT 1 VIEW Result Date: 05/26/2024 CLINICAL DATA:  Pneumonia. EXAM: PORTABLE CHEST 1 VIEW COMPARISON:  Chest radiograph dated 05/22/2024 FINDINGS: No focal consolidation, pleural effusion, pneumothorax. Mild cardiomegaly. Left pectoral AICD device. Atherosclerotic calcification of the aorta. No acute osseous pathology. IMPRESSION: 1. No active disease. 2. Mild cardiomegaly. Electronically Signed   By: Vanetta Chou M.D.   On: 05/26/2024 16:03    Scheduled Meds:  amiodarone   200 mg Oral Daily   apixaban   5 mg Oral BID   Chlorhexidine  Gluconate Cloth  6 each Topical Daily   colestipol  1 g Oral BID   dapagliflozin  propanediol  5 mg Oral Daily   famotidine   20 mg Oral Daily   insulin  aspart  0-5 Units Subcutaneous QHS   insulin  aspart  0-9 Units Subcutaneous TID WC   levothyroxine   25 mcg Oral Daily   loperamide  2 mg Oral QID   LORazepam   0.5 mg Oral QHS   mexiletine  300 mg Oral BID   mupirocin  ointment  1 Application Nasal BID   nystatin    Topical BID   sacubitril -valsartan   1 tablet Oral BID   Continuous Infusions:  cefTRIAXone  (ROCEPHIN )  IV 1 g (05/27/24 1737)     LOS: 5 days   Ivonne Mustache, MD [facility] P11/06/2024, 8:10 AM

## 2024-05-28 NOTE — Plan of Care (Signed)
  Problem: Metabolic: Goal: Ability to maintain appropriate glucose levels will improve Outcome: Progressing   Problem: Nutritional: Goal: Maintenance of adequate nutrition will improve Outcome: Progressing   Problem: Skin Integrity: Goal: Risk for impaired skin integrity will decrease Outcome: Progressing   

## 2024-05-28 NOTE — Progress Notes (Signed)
      Progress Note   Subjective  Patient thinks added Colestid has helped. States diarrhea is improved today. Otherwise eating well, denies complaints.   Objective   Vital signs in last 24 hours: Temp:  [97.4 F (36.3 C)-97.6 F (36.4 C)] 97.4 F (36.3 C) (11/12 0800) Pulse Rate:  [44-77] 76 (11/12 0800) Resp:  [15-19] 19 (11/12 0800) BP: (78-102)/(44-71) 102/53 (11/12 0800) SpO2:  [94 %-98 %] 96 % (11/12 0800) FiO2 (%):  [21 %] 21 % (11/11 2045) Last BM Date : 05/28/24 General:    white female in NAD Neurologic:  Alert and oriented,  grossly normal neurologically. Psych:  Cooperative. Normal mood and affect.  Intake/Output from previous day: 11/11 0701 - 11/12 0700 In: 954.3 [P.O.:354; IV Piggyback:600.3] Out: 600 [Urine:600] Intake/Output this shift: Total I/O In: 518 [P.O.:518] Out: 0   Lab Results: Recent Labs    05/26/24 1616 05/27/24 0450 05/28/24 0334  WBC 10.5 11.5* 10.8*  HGB 16.0* 16.1* 15.8*  HCT 49.7* 49.3* 48.7*  PLT 68* 68* 87*   BMET Recent Labs    05/26/24 1616 05/27/24 0450 05/28/24 0334  NA 136 133* 135  K 4.0 3.8 3.7  CL 104 103 101  CO2 20* 21* 26  GLUCOSE 114* 95 98  BUN 14 13 15   CREATININE 1.05* 1.05* 1.34*  CALCIUM  7.9* 7.7* 7.4*   LFT Recent Labs    05/26/24 1616  PROT 5.5*  ALBUMIN 2.4*  AST 58*  ALT 32  ALKPHOS 42  BILITOT 1.0   PT/INR Recent Labs    05/27/24 1136  LABPROT 20.4*  INR 1.7*    Studies/Results: DG CHEST PORT 1 VIEW Result Date: 05/26/2024 CLINICAL DATA:  Pneumonia. EXAM: PORTABLE CHEST 1 VIEW COMPARISON:  Chest radiograph dated 05/22/2024 FINDINGS: No focal consolidation, pleural effusion, pneumothorax. Mild cardiomegaly. Left pectoral AICD device. Atherosclerotic calcification of the aorta. No acute osseous pathology. IMPRESSION: 1. No active disease. 2. Mild cardiomegaly. Electronically Signed   By: Vanetta Chou M.D.   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 Fecal calprotectin sent and pending. We have added colestid 1gm BID and scheduled immodium QID and she think it is helping and feeling better today. Will continue with the regimen since she thinks it is helping, and increase colestid to 2gm BID.   Would monitor for now on the regimen and see how she does. If fecal calprotectin is significantly elevated, or she fails to respond to medical therapy, then would consider colonoscopy as inpatient. Of note, ventral hernia noted containing small segment of transverse colon which could make colonoscopy technically more challenging and increase risks, if that is pursued.  Of note, she does have chronic nausea and with her bowel symptoms and I hold Mounjaro for now with these symptoms. Seems to be tolerating PO at this time without issues.  Will see her again tomorrow, call with questions in the interim.  Marcey Naval, MD [facility]

## 2024-05-28 NOTE — Progress Notes (Deleted)
 Daily Progress Note  DOA: 05/22/2024 Hospital Day: 7  Cc: Diarrhea, nausea  ASSESSMENT    83 year old female admitted with chronic nausea and vomiting (Mounjaro contributing?),  a 2-week history of nonbloody diarrhea (mainly postprandial) and AKI Negative C -diff, Gi path TODAY : CRP elevated at 5.8 ( it was normal elevated in 2022 but normal in July ). ESR normal. Fecal calprotectin pending. Started BID Colestid and increased imodium yesterday and so far today has only had 2 bowel movements. Some nausea today but no vomiting. Not taking any of the prn anti-emetics. Didn't eat any of her lunch- mainly doesn't like the food  AKI on CKD  Felt to be ATN in setting of decreased PO intake, vomiting, diarrhea  Possible UTI On Rocephin . Culture pending  Possible cirrhosis (based on ultrasound July 2025) If she has cirrhosis then would probably be secondary to [facility] and appears to be compensated   Thrombocytopenia (new this admission) Hospitalists spoke with Hematology - Dr. Cloretta. He feels this is reequilibration secondary to hospitalization acute illness and does not feel that this is anything beyond probable cirrhosis-does not recommend  further workup  TODAY: Platelets stable at 87  ? GERD Takes Pepcid  at home  Remote history of colon polyps Tubular adenoma in 2003.  No follow-up colonoscopy done  Cholelithiasis   Periumbilical hernia containing a short segment of transverse colon   Paroxysmal Atrial Fibrillation  On Eliquis  ( not on hold)   Chronic systolic and diastolic heart failure EF 60-65% in 2023 . EF improved recently based on June 2025 echo ,currently 70-75%   Morbid obesity DM2 On Mounjaro   COPD   Multiple other comorbidities: Obstructive sleep apnea on CPAP, HTN, PAD, Hx of DVT, hypothyroidism.  See PMH for any additional medical history  / medical problems   Principal Problem:   AKI (acute kidney injury) Active Problems:   Obstructive sleep  apnea   Essential hypertension   COPD (chronic obstructive pulmonary disease) (HCC)   Paroxysmal atrial fibrillation (HCC)   Diarrhea   Encounter for assessment for deep vein thrombosis (DVT)   CKD (chronic kidney disease) stage 3, GFR 30-59 ml/min (HCC)   Diabetic nephropathy (HCC)   Type 2 diabetes mellitus with hyperlipidemia (HCC)   Hypothyroidism   Nausea and vomiting   Dehydration   PLAN   --Continue daily famotidine  --Going to hold imodium for now. Has a few abnormal bowel sounds in left abdominal and mild distention so may be developing ileus.  --Awaiting fecal calprotectin   Subjective   Only 2 BMs so far today ( loose she thinks). Didn't eat lunch - doesn't like food here. Some nausea   Objective    Recent Labs    05/26/24 1616 05/27/24 0450 05/28/24 0334  WBC 10.5 11.5* 10.8*  HGB 16.0* 16.1* 15.8*  HCT 49.7* 49.3* 48.7*  MCV 92.2 91.3 92.6  PLT 68* 68* 87*   No results for input(s): FOLATE, VITAMINB12, FERRITIN, TIBC, IRONPCTSAT in the last 72 hours. Recent Labs    05/26/24 1616 05/27/24 0450 05/28/24 0334  NA 136 133* 135  K 4.0 3.8 3.7  CL 104 103 101  CO2 20* 21* 26  GLUCOSE 114* 95 98  BUN 14 13 15   CREATININE 1.05* 1.05* 1.34*  CALCIUM  7.9* 7.7* 7.4*   Recent Labs    05/26/24 1616  PROT 5.5*  ALBUMIN 2.4*  AST 58*  ALT 32  ALKPHOS 42  BILITOT 1.0     Imaging:  DG CHEST PORT 1 VIEW CLINICAL DATA:  Pneumonia.  EXAM: PORTABLE CHEST 1 VIEW  COMPARISON:  Chest radiograph dated 05/22/2024  FINDINGS: No focal consolidation, pleural effusion, pneumothorax. Mild cardiomegaly. Left pectoral AICD device. Atherosclerotic calcification of the aorta. No acute osseous pathology.  IMPRESSION: 1. No active disease. 2. Mild cardiomegaly.  Electronically Signed   By: Vanetta Chou M.D.   On: 05/26/2024 16:03     Scheduled inpatient medications:   amiodarone   200 mg Oral Daily   apixaban   5 mg Oral BID    Chlorhexidine  Gluconate Cloth  6 each Topical Daily   colestipol  1 g Oral BID   dapagliflozin  propanediol  5 mg Oral Daily   famotidine   20 mg Oral Daily   insulin  aspart  0-5 Units Subcutaneous QHS   insulin  aspart  0-9 Units Subcutaneous TID WC   levothyroxine   25 mcg Oral Daily   loperamide  2 mg Oral QID   LORazepam   0.5 mg Oral QHS   mexiletine  300 mg Oral BID   mupirocin  ointment  1 Application Nasal BID   nystatin    Topical BID   sacubitril -valsartan   1 tablet Oral BID   Continuous inpatient infusions:   cefTRIAXone  (ROCEPHIN )  IV 1 g (05/27/24 1737)   PRN inpatient medications: acetaminophen , guaiFENesin -dextromethorphan, ondansetron  (ZOFRAN ) IV, mouth rinse  Vital signs in last 24 hours: Temp:  [97.4 F (36.3 C)-97.6 F (36.4 C)] 97.4 F (36.3 C) (11/12 0800) Pulse Rate:  [44-77] 76 (11/12 0800) Resp:  [15-19] 19 (11/12 0800) BP: (78-102)/(44-71) 102/53 (11/12 0800) SpO2:  [94 %-98 %] 96 % (11/12 0800) FiO2 (%):  [21 %] 21 % (11/11 2045) Last BM Date : 05/28/24  Intake/Output Summary (Last 24 hours) at 05/28/2024 1241 Last data filed at 05/28/2024 0950 Gross per 24 hour  Intake 718.33 ml  Output 600 ml  Net 118.33 ml    Intake/Output from previous day: 11/11 0701 - 11/12 0700 In: 954.3 [P.O.:354; IV Piggyback:600.3] Out: 600 [Urine:600] Intake/Output this shift: Total I/O In: 118 [P.O.:118] Out: 0    Physical Exam:  General: Alert female in NAD Heart:  Regular rate   Pulmonary: Normal respiratory effort Abdomen: Soft, mildly distended and tympanitic. A very occasional high-pitched bowel sound, especially in LLQ. Nontender.  Neurologic: Alert and oriented Psych: Pleasant. Cooperative     LOS: 5 days   Vina Dasen ,NP 05/28/2024, 12:41 PM

## 2024-05-28 NOTE — Discharge Instructions (Signed)

## 2024-05-28 NOTE — Progress Notes (Signed)
   05/28/24 2300  BiPAP/CPAP/SIPAP  BiPAP/CPAP/SIPAP Pt Type Adult  BiPAP/CPAP/SIPAP Resmed  Reason BIPAP/CPAP not in use Non-compliant (pt declined cpap for tonight)

## 2024-05-28 NOTE — Progress Notes (Signed)
 Physical Therapy Treatment Patient Details Name: Heidi Stark MRN: [id number] DOB: Aug 31, 1940 Today's Date: 05/28/2024   History of Present Illness 83 y.o. female presents to [facility] hospital on 05/22/2024 with nausea, vomiting, diarrhea and weakness. Pt admitted for management of AKI, also with Vtach run on 11/7. PMH includes OSA, HTN, COPD, PAF, CKD III, DMII.    PT Comments  Pt agreeable to participate in physical therapy session. Pt requiring min assist for bed mobility and transfers to standing. Unfortunately, upon sitting edge of bed pt with increased nausea and diarrhea, so returned to supine and provided peri care and linen change with RN assist. MD present at end of session. Will continue to follow acutely to address strengthening and transfer training.    If plan is discharge home, recommend the following: A little help with walking and/or transfers;A little help with bathing/dressing/bathroom;Assistance with cooking/housework;Assist for transportation;Help with stairs or ramp for entrance   Can travel by private vehicle        Equipment Recommendations  None recommended by PT    Recommendations for Other Services       Precautions / Restrictions Precautions Precautions: Fall Recall of Precautions/Restrictions: Intact Restrictions Weight Bearing Restrictions Per Provider Order: No     Mobility  Bed Mobility Overal bed mobility: Needs Assistance Bed Mobility: Rolling, Sidelying to Sit, Sit to Supine Rolling: Contact guard assist, Min assist Sidelying to sit: Min assist, Used rails   Sit to supine: Min assist   General bed mobility comments: Pt able to manuever legs off edge of bed, assist at trunk to fully sit up. LE assist back into bed    Transfers Overall transfer level: Needs assistance Equipment used: Rolling walker (2 wheels), 1 person hand held assist Transfers: Sit to/from Stand, Bed to chair/wheelchair/BSC Sit to Stand: Min assist           General  transfer comment: MinA to power up from elevated bed height. Took some lateral steps to R with increased time/effort    Ambulation/Gait                   Stairs             Wheelchair Mobility     Tilt Bed    Modified Rankin (Stroke Patients Only)       Balance Overall balance assessment: Needs assistance Sitting-balance support: No upper extremity supported, Feet supported Sitting balance-Leahy Scale: Fair     Standing balance support: Bilateral upper extremity supported, Reliant on assistive device for balance Standing balance-Leahy Scale: Poor                              Communication Communication Communication: No apparent difficulties  Cognition Arousal: Alert Behavior During Therapy: WFL for tasks assessed/performed   PT - Cognitive impairments: No apparent impairments                         Following commands: Intact      Cueing Cueing Techniques: Verbal cues  Exercises      General Comments        Pertinent Vitals/Pain Pain Assessment Pain Assessment: No/denies pain    Home Living                          Prior Function            PT Goals (current  goals can now be found in the care plan section) Acute Rehab PT Goals Patient Stated Goal: to return home PT Goal Formulation: With patient Time For Goal Achievement: 06/08/24 Potential to Achieve Goals: Fair    Frequency    Min 2X/week      PT Plan      Co-evaluation              AM-PAC PT 6 Clicks Mobility   Outcome Measure  Help needed turning from your back to your side while in a flat bed without using bedrails?: A Little Help needed moving from lying on your back to sitting on the side of a flat bed without using bedrails?: A Little Help needed moving to and from a bed to a chair (including a wheelchair)?: A Little Help needed standing up from a chair using your arms (e.g., wheelchair or bedside chair)?: A Little Help  needed to walk in hospital room?: Total Help needed climbing 3-5 steps with a railing? : Total 6 Click Score: 14    End of Session Equipment Utilized During Treatment: Gait belt Activity Tolerance: Patient tolerated treatment well Patient left: in bed;with call bell/phone within reach;with bed alarm set Nurse Communication: Mobility status PT Visit Diagnosis: Other abnormalities of gait and mobility (R26.89);Muscle weakness (generalized) (M62.81)     Time: 9041-8975 PT Time Calculation (min) (ACUTE ONLY): 26 min  Charges:    $Therapeutic Activity: 23-37 mins PT General Charges $$ ACUTE PT VISIT: 1 Visit                     Aleck Daring, PT, DPT Acute Rehabilitation Services Office [phone]    Aleck ONEIDA Daring 05/28/2024, 11:49 AM

## 2024-05-29 DIAGNOSIS — R197 Diarrhea, unspecified: Secondary | ICD-10-CM | POA: Diagnosis not present

## 2024-05-29 DIAGNOSIS — R112 Nausea with vomiting, unspecified: Secondary | ICD-10-CM | POA: Diagnosis not present

## 2024-05-29 DIAGNOSIS — N179 Acute kidney failure, unspecified: Secondary | ICD-10-CM | POA: Diagnosis not present

## 2024-05-29 LAB — BASIC METABOLIC PANEL WITH GFR
Anion gap: 9 (ref 5–15)
BUN: 16 mg/dL (ref 8–23)
CO2: 21 mmol/L — ABNORMAL LOW (ref 22–32)
Calcium: 7.5 mg/dL — ABNORMAL LOW (ref 8.9–10.3)
Chloride: 103 mmol/L (ref 98–111)
Creatinine, Ser: 1.36 mg/dL — ABNORMAL HIGH (ref 0.44–1.00)
GFR, Estimated: 39 mL/min — ABNORMAL LOW (ref >60)
Glucose, Bld: 100 mg/dL — ABNORMAL HIGH (ref 70–99)
Potassium: 4.3 mmol/L (ref 3.5–5.1)
Sodium: 133 mmol/L — ABNORMAL LOW (ref 135–145)

## 2024-05-29 LAB — CBC WITH DIFFERENTIAL/PLATELET
Abs Immature Granulocytes: 0.05 K/uL (ref 0.00–0.07)
Basophils Absolute: 0.1 K/uL (ref 0.0–0.1)
Basophils Relative: 1 %
Eosinophils Absolute: 0.2 K/uL (ref 0.0–0.5)
Eosinophils Relative: 2 %
HCT: 46.1 % — ABNORMAL HIGH (ref 36.0–46.0)
Hemoglobin: 15 g/dL (ref 12.0–15.0)
Immature Granulocytes: 1 %
Lymphocytes Relative: 29 %
Lymphs Abs: 3 K/uL (ref 0.7–4.0)
MCH: 30.1 pg (ref 26.0–34.0)
MCHC: 32.5 g/dL (ref 30.0–36.0)
MCV: 92.6 fL (ref 80.0–100.0)
Monocytes Absolute: 1.9 K/uL — ABNORMAL HIGH (ref 0.1–1.0)
Monocytes Relative: 18 %
Neutro Abs: 5.1 K/uL (ref 1.7–7.7)
Neutrophils Relative %: 49 %
Platelets: 103 K/uL — ABNORMAL LOW (ref 150–400)
RBC: 4.98 MIL/uL (ref 3.87–5.11)
RDW: 16 % — ABNORMAL HIGH (ref 11.5–15.5)
WBC: 10.1 K/uL (ref 4.0–10.5)
nRBC: 0 % (ref 0.0–0.2)

## 2024-05-29 LAB — HAPTOGLOBIN: Haptoglobin: 152 mg/dL (ref 41–333)

## 2024-05-29 LAB — GLUCOSE, CAPILLARY
Glucose-Capillary: 115 mg/dL — ABNORMAL HIGH (ref 70–99)
Glucose-Capillary: 151 mg/dL — ABNORMAL HIGH (ref 70–99)
Glucose-Capillary: 128 mg/dL — ABNORMAL HIGH (ref 70–99)
Glucose-Capillary: 109 mg/dL — ABNORMAL HIGH (ref 70–99)

## 2024-05-29 MED ORDER — LOPERAMIDE HCL 2 MG PO CAPS
2.0000 mg | ORAL_CAPSULE | Freq: Three times a day (TID) | ORAL | Status: DC
  Administered 2024-05-29 – 2024-06-01 (×9): 2 mg via ORAL
  Filled 2024-05-29 (×9): qty 1

## 2024-05-29 MED ORDER — SODIUM CHLORIDE 0.9 % IV SOLN: INTRAVENOUS | Status: DC

## 2024-05-29 MED ORDER — SODIUM CHLORIDE 0.9 % IV BOLUS
500.0000 mL | Freq: Once | INTRAVENOUS | Status: AC
  Administered 2024-05-29: 500 mL via INTRAVENOUS

## 2024-05-29 NOTE — Plan of Care (Signed)
   Problem: Nutritional: Goal: Maintenance of adequate nutrition will improve Outcome: Progressing

## 2024-05-29 NOTE — Progress Notes (Addendum)
 Physical Therapy Treatment Patient Details Name: Heidi Stark MRN: [id number] DOB: [birth year] Today's Date: 05/29/2024   History of Present Illness 83 y.o. female presents to [facility] hospital on 05/22/2024 with nausea, vomiting, diarrhea and weakness. Pt admitted for management of AKI, also with Vtach run on 11/7. PMH includes OSA, HTN, COPD, PAF, CKD III, DMII.    PT Comments  Pt received sitting up in chair and agreeable to PT session. Pt stating she is feeling weak and tired today, and per daughter, pt appears very out of it and that this is not her cognitive baseline. Also, daughter states pt typically mobilizes with w/c, but must walk about 20' to enter the home, which she typically can do. Attempted to take orthostatics today, but pt unable to reach standing even with heavy maxA x2 with boost from gait belt and hips. Cookie Ip, and with two attempts were able to assist her into standing high enough to get plastic pads under her bottom. Bed mobility requiring min-modA for pericare. Pt reports continued dizziness and lightheadedness as well. Discussed discharge recs with pt and daughter, but they had a previous bad experience at an assisted living facility and both are adamant she not return to one. However, daughter realizes she and her husband could not assist pt properly if she were to return home in current state of mobility. Recommending post-acute rehab <3 hrs/day with understanding they will likely refuse and ask for [facility] PT. If progression in future sessions, could flip back to HHPT. Acute PT to follow to improve activity tolerance and safety with functional mobility.   Vitals: Sitting Start of Session:  87/64 (73)  Sitting after transfer back to EOB: 84/53 (64) Supine end of session:  85/51 (63)  Pt reported continuous dizziness    If plan is discharge home, recommend the following: Two people to help with walking and/or transfers;Two people to help with  bathing/dressing/bathroom;Assistance with cooking/housework;Assist for transportation   Can travel by private vehicle     No  Equipment Recommendations  None recommended by PT    Recommendations for Other Services       Precautions / Restrictions Precautions Precautions: Fall Recall of Precautions/Restrictions: Intact Restrictions Weight Bearing Restrictions Per Provider Order: No     Mobility  Bed Mobility Overal bed mobility: Needs Assistance Bed Mobility: Rolling, Sit to Sidelying Rolling: Mod assist         General bed mobility comments: Requiring assist for LE elevation onto bed with sit>sidelying. Requiring shoulder and hip assist with rolling to perform pericare.    Transfers Overall transfer level: Needs assistance Equipment used: Rolling walker (2 wheels), Ambulation equipment used   Sit to Stand: Max assist, +2 physical assistance             Transfer via Lift Equipment: Stedy  Attempted STS 3x, with pt demonstrating dec ability to assist into standing even with maxA x2 assist. Cookie Ip, and needed 2 attempts to come into standing with maxA x2 to get plastic pads under her bottom. Pt unable to maintain grip with L hand on Stedy, seemingly d/t lethargy/unfocused. Stedy utilized to transfer pt back to bed from chair.   Ambulation/Gait                   Stairs             Wheelchair Mobility     Tilt Bed    Modified Rankin (Stroke Patients Only)       Balance Overall  balance assessment: Needs assistance Sitting-balance support: No upper extremity supported, Feet supported Sitting balance-Leahy Scale: Fair     Standing balance support: Bilateral upper extremity supported, Reliant on assistive device for balance Standing balance-Leahy Scale: Zero                              Communication Communication Communication: No apparent difficulties  Cognition Arousal: Alert, Lethargic Behavior During Therapy:  Flat affect   PT - Cognitive impairments: No apparent impairments                       PT - Cognition Comments: Pt appearing very lethargic today, and stated she's feeling weak. A&Ox4 Following commands: Intact      Cueing Cueing Techniques: Verbal cues, Gestural cues  Exercises      General Comments General comments (skin integrity, edema, etc.): Orthostatics performed today, with consistently low BP in various positions (see clinical impression). Pt unable to provide much assist for mobility today and unable to stand without Stedy. Per daughter present in the room, pt seems very out of it and that this is not her baseline.      Pertinent Vitals/Pain Pain Assessment Pain Assessment: Faces Faces Pain Scale: No hurt    Home Living                          Prior Function            PT Goals (current goals can now be found in the care plan section) Acute Rehab PT Goals Patient Stated Goal: to return home PT Goal Formulation: With patient/family Time For Goal Achievement: 06/08/24 Potential to Achieve Goals: Fair Progress towards PT goals: Not progressing toward goals - comment (Appears pt is requiring more assist for mobility than previous session.)    Frequency    Min 2X/week      PT Plan      Co-evaluation              AM-PAC PT 6 Clicks Mobility   Outcome Measure  Help needed turning from your back to your side while in a flat bed without using bedrails?: A Lot Help needed moving from lying on your back to sitting on the side of a flat bed without using bedrails?: A Lot Help needed moving to and from a bed to a chair (including a wheelchair)?: A Lot Help needed standing up from a chair using your arms (e.g., wheelchair or bedside chair)?: A Lot Help needed to walk in hospital room?: Total Help needed climbing 3-5 steps with a railing? : Total 6 Click Score: 10    End of Session Equipment Utilized During Treatment: Gait  belt Activity Tolerance: Patient tolerated treatment well Patient left: in bed;with call bell/phone within reach;with bed alarm set;with family/visitor present Nurse Communication: Mobility status;Other (comment) (BP and BM - notified MD also) PT Visit Diagnosis: Other abnormalities of gait and mobility (R26.89);Muscle weakness (generalized) (M62.81)     Time: 8777-8689 PT Time Calculation (min) (ACUTE ONLY): 48 min  Charges:    $Therapeutic Activity: 38-52 mins PT General Charges $$ ACUTE PT VISIT: 1 Visit                     Jolaine Fryberger, SPT    Neeti Knudtson 05/29/2024, 2:11 PM

## 2024-05-29 NOTE — Progress Notes (Signed)
 Mobility Specialist Progress Note:    05/29/24 1200  Mobility  Activity Pivoted/transferred from bed to chair  Level of Assistance Moderate assist, patient does 50-74% (+2)  Assistive Device Front wheel walker  Distance Ambulated (ft) 3 ft  Activity Response Tolerated well  Mobility Referral Yes  Mobility visit 1 Mobility  Mobility Specialist Start Time (ACUTE ONLY) 1002  Mobility Specialist Stop Time (ACUTE ONLY) 1013  Mobility Specialist Time Calculation (min) (ACUTE ONLY) 11 min   Pt received in bed agreeable to mobility. Was able to get to EOB w/ MinA. For STS pt required a ModA +2. Was able to take a couple steps towards the chair w/o fault. Left in chair w/ call bell and personal belongings in reach. All needs met.   Thersia Minder Mobility Specialist  Please contact vis Secure Chat or  Rehab Office [phone]

## 2024-05-29 NOTE — Progress Notes (Signed)
   05/29/24 2130  BiPAP/CPAP/SIPAP  BiPAP/CPAP/SIPAP Pt Type Adult  Reason BIPAP/CPAP not in use Non-compliant

## 2024-05-29 NOTE — Progress Notes (Addendum)
 PROGRESS NOTE  Heidi Stark  FMW:[id number] DOB: 1940-08-05 DOA: 05/22/2024 PCP: Heidi Cyndee Jamee JONELLE, DO   Brief Narrative: Patient is a 83 year old female with history of diabetes type 2, HFrEF, VT status post ICD [employer] placement taking amiodarone , cardioembolic splenic/renal infarcts, atrial fibrillation status post ablation, peripheral artery disease, coronary arterydisease, OSA on CPAP, CKD stage III, hypothyroidism who presented with nausea, vomiting, weakness, diarrhea.  Found to be dehydrated on presentation.  GI consulted.  EP were also following due to concern for VT. Hospital course notable for persistent diarrhea, now improved.  Possible discharge home tomorrow if diarrhea continues to improve.  .Assessment & Plan:  Principal Problem:   AKI (acute kidney injury) Active Problems:   CKD (chronic kidney disease) stage 3, GFR 30-59 ml/min (HCC)   COPD (chronic obstructive pulmonary disease) (HCC)   Paroxysmal atrial fibrillation (HCC)   Obstructive sleep apnea   Type 2 diabetes mellitus with hyperlipidemia (HCC)   Essential hypertension   Hypothyroidism   Diarrhea   Encounter for assessment for deep vein thrombosis (DVT)   Diabetic nephropathy (HCC)   Nausea and vomiting   Dehydration  AKI on CKD stage IIIb: From volume depletion from vomiting, diarrhea.  Currently kidney function at baseline.  Diarrheal illness: GI following.  Taking Mounjaro at home.  C. difficile /gI pathogen negative.  Currently on Imodium, colestipol.  Fecal calprotectin pending.  There is plan for possible colonoscopy./EGD but since diarrhea has improved, it has been canceled.  No loose stools today.  GI recommended to discontinue Mounjaro permanently on discharge.  Continue Imodium, colestipol.  Fecal calprotectin pending  V. tach: Has ICD.  Takes amiodarone , metoprolol , Mexitil .  EP was consulted, PPM adjusted.  EP planning for placement of new RV lead on 06/05/2024 as an outpatient.  Paroxysmal  A-fib: Remains in A-fib rhythm with controlled rate.  Continue metoprolol , amiodarone   HFmrEF: Improved EF as per recent echo in June 2025 of 70 to 75%.  Takes Lasix , Entresto , Farxiga , Inspra  at home  COPD: Currently not in exacerbation.  Continue bronchodilators as needed  Diabetes type 2: .  Mounjaro will be discontinued.  A1c of 6.2.   Takes Farxiga , Mounjaro ,Humalog  10 units twice daily at home.  Hypothyroidism: Continue Synthyroid at 25 mcg.  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  Hepatitis panel negative.   Thrombocytopenia: Likely associated with history of  cirrhosis or acute illness.  Case was discussed with hematology  UTI: UA showed large leukocytes, no nitrates.  Urine culture pending.  On ceftriaxone ,plan for 3 days course. No dysuria  Debility/deconditioning: PT recommended home health on discharge.  Mostly nonambulatory.  Wheelchair dependent.  Lives with daughter  Morbid obesity: BMI of  40.3        Wound 05/23/24 0632 Pressure Injury Buttocks Left Stage 2 -  Partial thickness loss of dermis presenting as a shallow open injury with a red, pink wound bed without slough. (Active)     Wound 05/23/24 0556 Pressure Injury Ankle Left;Posterior Stage 2 -  Partial thickness loss of dermis presenting as a shallow open injury with a red, pink wound bed without slough. (Active)    DVT prophylaxis: apixaban  (ELIQUIS ) tablet 5 mg     Code Status: Limited: Do not attempt resuscitation (DNR) -DNR-LIMITED -Do Not Intubate/DNI   Family Communication: Called daughter Heidi Stark twice on 11/12,directly goes to voice mail.Called and discussed with son Heidi Stark on phone on 11/13   Patient status: Inpatient  Patient is from : Home  Anticipated discharge  to: Home  Estimated DC date: Tomorrow   Consultants: Inpatient  Procedures:None yet  Antimicrobials:  Anti-infectives (From admission, onward)    Start     Dose/Rate Route Frequency Ordered Stop   05/27/24 1745   cefTRIAXone  (ROCEPHIN ) 1 g in sodium chloride  0.9 % 100 mL IVPB        1 g 200 mL/hr over 30 Minutes Intravenous Every 24 hours 05/27/24 1657 05/30/24 1744       Subjective: Patient seen and examined at bedside today.  Feels much better today.  No loose stools this morning.  No abdomen pain, nausea or vomiting.  No new complaints.  We discussed about monitoring 1 more day and possible discharge home tomorrow  Objective: Vitals:   05/28/24 1642 05/28/24 2001 05/29/24 0417 05/29/24 0849  BP: 104/68 (!) 99/56 117/84 110/71  Pulse: 70 81 72 73  Resp: 19 18 18 17   Temp: (!) 97.1 F (36.2 C) (!) 97.5 F (36.4 C) 97.7 F (36.5 C) (!) 97.5 F (36.4 C)  TempSrc: Oral   Oral  SpO2: 97% 97% 94% 96%  Weight:      Height:        Intake/Output Summary (Last 24 hours) at 05/29/2024 1257 Last data filed at 05/29/2024 9045 Gross per 24 hour  Intake 458 ml  Output 350 ml  Net 108 ml   Filed Weights   05/22/24 2118  Weight: 113.4 kg    Examination:  General exam: Overall comfortable, not in distress,morbidly obese HEENT: PERRL Respiratory system:  no wheezes or crackles  Cardiovascular system: irregularly irregular rhythm Gastrointestinal system: Abdomen is nondistended, soft and nontender. Central nervous system: Alert and oriented Extremities: No edema, no clubbing ,no cyanosis Skin: No rashes, no ulcers,no icterus     Data Reviewed: I have personally reviewed following labs and imaging studies  CBC: Recent Labs  Lab 05/26/24 0522 05/26/24 1616 05/27/24 0450 05/28/24 0334 05/29/24 0355  WBC 11.5* 10.5 11.5* 10.8* 10.1  NEUTROABS 6.9 6.4 7.0 6.4 5.1  HGB 16.0* 16.0* 16.1* 15.8* 15.0  HCT 48.8* 49.7* 49.3* 48.7* 46.1*  MCV 91.9 92.2 91.3 92.6 92.6  PLT 65* 68* 68* 87* 103*   Basic Metabolic Panel: Recent Labs  Lab 05/23/24 0114 05/23/24 0604 05/25/24 0316 05/25/24 0805 05/26/24 0522 05/26/24 1616 05/27/24 0450 05/28/24 0334 05/29/24 0355  NA  --    < >  --     < > 138 136 133* 135 133*  K  --    < >  --    < > 5.5* 4.0 3.8 3.7 4.3  CL  --    < >  --    < > 105 104 103 101 103  CO2  --    < >  --    < > 21* 20* 21* 26 21*  GLUCOSE  --    < >  --    < > 98 114* 95 98 100*  BUN  --    < >  --    < > 17 14 13 15 16   CREATININE  --    < >  --    < > 1.15* 1.05* 1.05* 1.34* 1.36*  CALCIUM   --    < >  --    < > 8.2* 7.9* 7.7* 7.4* 7.5*  MG 2.0  --  2.1  --  2.3  --   --   --   --    < > = values in this  interval not displayed.     Recent Results (from the past 240 hours)  Gastrointestinal Panel by PCR , Stool     Status: None   Collection Time: 05/23/24  2:38 AM   Specimen: Stool  Result Value Ref Range Status   Campylobacter species NOT DETECTED NOT DETECTED Final   Plesimonas shigelloides NOT DETECTED NOT DETECTED Final   Salmonella species NOT DETECTED NOT DETECTED Final   Yersinia enterocolitica NOT DETECTED NOT DETECTED Final   Vibrio species NOT DETECTED NOT DETECTED Final   Vibrio cholerae NOT DETECTED NOT DETECTED Final   Enteroaggregative E coli (EAEC) NOT DETECTED NOT DETECTED Final   Enteropathogenic E coli (EPEC) NOT DETECTED NOT DETECTED Final   Enterotoxigenic E coli (ETEC) NOT DETECTED NOT DETECTED Final   Shiga like toxin producing E coli (STEC) NOT DETECTED NOT DETECTED Final   Shigella/Enteroinvasive E coli (EIEC) NOT DETECTED NOT DETECTED Final   Cryptosporidium NOT DETECTED NOT DETECTED Final   Cyclospora cayetanensis NOT DETECTED NOT DETECTED Final   Entamoeba histolytica NOT DETECTED NOT DETECTED Final   Giardia lamblia NOT DETECTED NOT DETECTED Final   Adenovirus F40/41 NOT DETECTED NOT DETECTED Final   Astrovirus NOT DETECTED NOT DETECTED Final   Norovirus GI/GII NOT DETECTED NOT DETECTED Final   Rotavirus A NOT DETECTED NOT DETECTED Final   Sapovirus (I, II, IV, and V) NOT DETECTED NOT DETECTED Final    Comment: Performed at [facility], [address] [place], [place] [address]  C Difficile Quick  Screen w PCR reflex     Status: None   Collection Time: 05/23/24  2:38 AM   Specimen: Stool  Result Value Ref Range Status   C Diff antigen NEGATIVE NEGATIVE Final   C Diff toxin NEGATIVE NEGATIVE Final   C Diff interpretation No C. difficile detected.  Final    Comment: Performed at [facility], [address] [place], [place] [address]  MRSA Next Gen by PCR, Nasal     Status: Abnormal   Collection Time: 05/26/24 11:27 AM   Specimen: Nasal Mucosa; Nasal Swab  Result Value Ref Range Status   MRSA by PCR Next Gen DETECTED (A) NOT DETECTED Final    Comment: RESULT CALLED TO, READ BACK BY AND VERIFIED WITH: RN PRECIOUS S ON 05/26/24 @ 1440 BY DRT (NOTE) The GeneXpert MRSA Assay (FDA approved for NASAL specimens only), is one component of a comprehensive MRSA colonization surveillance program. It is not intended to diagnose MRSA infection nor to guide or monitor treatment for MRSA infections. Test performance is not FDA approved in patients less than 32 years old. Performed at [facility], [address] [place], [place] [address]      Radiology Studies: No results found.   Scheduled Meds:  amiodarone   200 mg Oral Daily   apixaban   5 mg Oral BID   Chlorhexidine  Gluconate Cloth  6 each Topical Daily   colestipol  2 g Oral BID   dapagliflozin  propanediol  5 mg Oral Daily   famotidine   20 mg Oral Daily   gabapentin   300 mg Oral BID   insulin  aspart  0-5 Units Subcutaneous QHS   insulin  aspart  0-9 Units Subcutaneous TID WC   levothyroxine   25 mcg Oral Daily   loperamide  2 mg Oral Q8H   LORazepam   0.5 mg Oral QHS   mexiletine  300 mg Oral BID   mupirocin  ointment  1 Application Nasal BID   nystatin   Topical BID   sacubitril -valsartan   1 tablet Oral BID   Continuous Infusions:  cefTRIAXone  (ROCEPHIN )  IV 1 g (05/28/24 1656)     LOS: 6 days   Ivonne Mustache, MD [facility] P11/13/2025, 12:57 PM

## 2024-05-29 NOTE — Progress Notes (Signed)
      Progress Note   Subjective  Patient states she continues to do better. Thinks the colestid is helping.    Objective   Vital signs in last 24 hours: Temp:  [97.1 F (36.2 C)-97.7 F (36.5 C)] 97.5 F (36.4 C) (11/13 0849) Pulse Rate:  [70-81] 73 (11/13 0849) Resp:  [17-19] 17 (11/13 0849) BP: (99-117)/(56-84) 110/71 (11/13 0849) SpO2:  [94 %-97 %] 96 % (11/13 0849) Last BM Date : 05/28/24 General:    white female in NAD Extremities:  Without edema. Neurologic:  Alert and oriented,  grossly normal neurologically.  Intake/Output from previous day: 11/12 0701 - 11/13 0700 In: 858 [P.O.:758; IV Piggyback:100] Out: 350 [Urine:350] Intake/Output this shift: Total I/O In: 118 [P.O.:118] Out: 0   Lab Results: Recent Labs    05/27/24 0450 05/28/24 0334 05/29/24 0355  WBC 11.5* 10.8* 10.1  HGB 16.1* 15.8* 15.0  HCT 49.3* 48.7* 46.1*  PLT 68* 87* 103*   BMET Recent Labs    05/27/24 0450 05/28/24 0334 05/29/24 0355  NA 133* 135 133*  K 3.8 3.7 4.3  CL 103 101 103  CO2 21* 26 21*  GLUCOSE 95 98 100*  BUN 13 15 16   CREATININE 1.05* 1.34* 1.36*  CALCIUM  7.7* 7.4* 7.5*   LFT Recent Labs    05/26/24 1616  PROT 5.5*  ALBUMIN 2.4*  AST 58*  ALT 32  ALKPHOS 42  BILITOT 1.0   PT/INR Recent Labs    05/27/24 1136  LABPROT 20.4*  INR 1.7*    Studies/Results: No results found.     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 Fecal calprotectin sent and remains pending. On higher dose colestid 2gm BID and scheduled immodium and doing better.   We discussed if she wanted to pursue colonoscopy while inpatient. Given she is doing better, she wants to hold off on that for now. We can coordinate close outpatient follow up once out of the hospital and if symptoms persist can proceed if  needed.   Of note, she does have chronic nausea and with her bowel symptoms, I would hold Mounjaro for now with these symptoms. Seems to be tolerating PO at this time.  I think if she does okay today, can be discharged tomorrow. I would continue colestid as currently dose and can take immodium PRN  We will sign off for now, call with questions.  Marcey Naval, MD [facility]

## 2024-05-30 ENCOUNTER — Encounter: Payer: Self-pay | Admitting: Cardiology

## 2024-05-30 DIAGNOSIS — N179 Acute kidney failure, unspecified: Secondary | ICD-10-CM | POA: Diagnosis not present

## 2024-05-30 LAB — CBC WITH DIFFERENTIAL/PLATELET
Abs Immature Granulocytes: 0.07 K/uL (ref 0.00–0.07)
Basophils Absolute: 0 K/uL (ref 0.0–0.1)
Basophils Relative: 0 %
Eosinophils Absolute: 0.1 K/uL (ref 0.0–0.5)
Eosinophils Relative: 2 %
HCT: 46.2 % — ABNORMAL HIGH (ref 36.0–46.0)
Hemoglobin: 14.9 g/dL (ref 12.0–15.0)
Immature Granulocytes: 1 %
Lymphocytes Relative: 23 %
Lymphs Abs: 1.9 K/uL (ref 0.7–4.0)
MCH: 30.1 pg (ref 26.0–34.0)
MCHC: 32.3 g/dL (ref 30.0–36.0)
MCV: 93.3 fL (ref 80.0–100.0)
Monocytes Absolute: 1.5 K/uL — ABNORMAL HIGH (ref 0.1–1.0)
Monocytes Relative: 19 %
Neutro Abs: 4.6 K/uL (ref 1.7–7.7)
Neutrophils Relative %: 55 %
Platelets: 102 K/uL — ABNORMAL LOW (ref 150–400)
RBC: 4.95 MIL/uL (ref 3.87–5.11)
RDW: 16.2 % — ABNORMAL HIGH (ref 11.5–15.5)
WBC: 8.2 K/uL (ref 4.0–10.5)
nRBC: 0 % (ref 0.0–0.2)

## 2024-05-30 LAB — BASIC METABOLIC PANEL WITH GFR
Anion gap: 13 (ref 5–15)
BUN: 18 mg/dL (ref 8–23)
CO2: 19 mmol/L — ABNORMAL LOW (ref 22–32)
Calcium: 7.5 mg/dL — ABNORMAL LOW (ref 8.9–10.3)
Chloride: 102 mmol/L (ref 98–111)
Creatinine, Ser: 1.38 mg/dL — ABNORMAL HIGH (ref 0.44–1.00)
GFR, Estimated: 38 mL/min — ABNORMAL LOW (ref >60)
Glucose, Bld: 83 mg/dL (ref 70–99)
Potassium: 3.7 mmol/L (ref 3.5–5.1)
Sodium: 134 mmol/L — ABNORMAL LOW (ref 135–145)

## 2024-05-30 LAB — GLUCOSE, CAPILLARY
Glucose-Capillary: 136 mg/dL — ABNORMAL HIGH (ref 70–99)
Glucose-Capillary: 107 mg/dL — ABNORMAL HIGH (ref 70–99)
Glucose-Capillary: 89 mg/dL (ref 70–99)
Glucose-Capillary: 108 mg/dL — ABNORMAL HIGH (ref 70–99)

## 2024-05-30 LAB — CALPROTECTIN, FECAL: Calprotectin, Fecal: 61 ug/g (ref 0–120)

## 2024-05-30 NOTE — Progress Notes (Addendum)
 PROGRESS NOTE  Heidi Stark  FMW:[id number] DOB: 03-18-1941 DOA: 05/22/2024 PCP: [name], DO   Brief Narrative: Patient is a 83 year old female with history of diabetes type 2, HFrEF, VT status post ICD Med[employer] placement taking amiodarone , cardioembolic splenic/renal infarcts, atrial fibrillation status post ablation, peripheral artery disease, coronary arterydisease, OSA on CPAP, CKD stage III, hypothyroidism who presented with nausea, vomiting, weakness, diarrhea.  Found to be dehydrated on presentation.  GI consulted.  EP were also following due to concern for VT. Hospital course notable for persistent diarrhea, now improved.  PT recommending SNF on discharge.  TOC consulted   .Assessment & Plan:  Principal Problem:   AKI (acute kidney injury) Active Problems:   CKD (chronic kidney disease) stage 3, GFR 30-59 ml/min (HCC)   COPD (chronic obstructive pulmonary disease) (HCC)   Paroxysmal atrial fibrillation (HCC)   Obstructive sleep apnea   Type 2 diabetes mellitus with hyperlipidemia (HCC)   Essential hypertension   Hypothyroidism   Diarrhea   Encounter for assessment for deep vein thrombosis (DVT)   Diabetic nephropathy (HCC)   Nausea and vomiting   Dehydration  AKI on CKD stage IIIb: From volume depletion from vomiting, diarrhea.  Currently kidney function at baseline.  Diarrheal illness: GI following.  Taking Mounjaro at home.  C. difficile /gI pathogen negative.  Currently on Imodium, colestipol.  Fecal calprotectin pending.  There is plan for possible colonoscopy./EGD but since diarrhea has improved, it has been canceled.  No loose stools today.  GI recommended to discontinue Mounjaro permanently on discharge.  Continue Imodium, colestipol.  Fecal calprotectin pending  Orthostatic hypotension: Blood pressure stable but she is orthostatic as per PT eval.  Started on IV fluids, given 500 mL bolus of fluid on 11/13.  Check orthostatic vitals tomorrow  V. tach:  Has ICD.  Takes amiodarone , metoprolol , Mexitil .  EP was consulted, PPM adjusted.  EP planning for placement of new RV lead on 06/05/2024 as an outpatient.  Paroxysmal A-fib: Remains in A-fib rhythm with controlled rate.  Continue, amiodarone .Metoprolol  on hold  HFmrEF: Improved EF as per recent echo in June 2025 of 70 to 75%.  Takes Lasix , Entresto , Farxiga , Inspra  at home.  Currently Lasix , Entresto , Inspra  on hold  COPD: Currently not in exacerbation.  Continue bronchodilators as needed  Diabetes type 2: .  Mounjaro will be discontinued.  A1c of 6.2.   Takes Farxiga , Mounjaro ,Humalog  10 units twice daily at home.  Hypothyroidism: Continue Synthyroid at 25 mcg.  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  Hepatitis panel negative.   Thrombocytopenia: Likely associated with history of  cirrhosis or acute illness.  Case was discussed with hematology  UTI: UA showed large leukocytes, no nitrates.  Urine culture pending.  On ceftriaxone , completed 3 days course. No dysuria  Debility/deconditioning: PT recommended SNF on discharge.  Mostly nonambulatory.  Wheelchair dependent.  Lives with daughter  Morbid obesity: BMI of  40.3        Wound 05/23/24 0632 Pressure Injury Buttocks Left Stage 2 -  Partial thickness loss of dermis presenting as a shallow open injury with a red, pink wound bed without slough. (Active)     Wound 05/23/24 0556 Pressure Injury Ankle Left;Posterior Stage 2 -  Partial thickness loss of dermis presenting as a shallow open injury with a red, pink wound bed without slough. (Active)    DVT prophylaxis:Place TED hose Start: 05/30/24 0953 apixaban  (ELIQUIS ) tablet 5 mg     Code Status: Limited: Do not  attempt resuscitation (DNR) -DNR-LIMITED -Do Not Intubate/DNI   Family Communication: Called and discussed with son [name] on phone on 11/13,11/14   Patient status: Inpatient  Patient is from : Home  Anticipated discharge to: SNF  Estimated DC date: 1-2  days   Consultants: GI  Procedures:None yet  Antimicrobials:  Anti-infectives (From admission, onward)    Start     Dose/Rate Route Frequency Ordered Stop   05/27/24 1745  cefTRIAXone  (ROCEPHIN ) 1 g in sodium chloride  0.9 % 100 mL IVPB        1 g 200 mL/hr over 30 Minutes Intravenous Every 24 hours 05/27/24 1657 05/29/24 1729       Subjective: Patient seen and examined at bedside today.  Overall comfortable.  Lying on bed.  Feels slightly weaker than yesterday.  Diarrhea has stopped.  She became orthostatic while working with physical therapy yesterday and again today.  On lying blood pressure stable.  Discussed with son on phone.  Son is interested on SNF if he gets a place in a good one  Objective: Vitals:   05/29/24 1658 05/29/24 2004 05/30/24 0422 05/30/24 0755  BP: (!) 109/58 103/74 117/74 101/66  Pulse: 79 89 73 77  Resp: 18 18 18 17   Temp: 97.6 F (36.4 C) 98.4 F (36.9 C) 97.8 F (36.6 C) (!) 97.5 F (36.4 C)  TempSrc: Oral     SpO2: 97% 96% 96% 95%  Weight:      Height:        Intake/Output Summary (Last 24 hours) at 05/30/2024 1056 Last data filed at 05/30/2024 9360 Gross per 24 hour  Intake 0 ml  Output 750 ml  Net -750 ml   Filed Weights   05/22/24 2118  Weight: 113.4 kg    Examination:   General exam: Overall comfortable, not in distress,morbidly obese HEENT: PERRL Respiratory system:  no wheezes or crackles  Cardiovascular system: irregularly irregular rhythm  Gastrointestinal system: Abdomen is nondistended, soft and nontender. Central nervous system: Alert and oriented Extremities: No edema, no clubbing ,no cyanosis Skin: No rashes, no ulcers,no icterus       Data Reviewed: I have personally reviewed following labs and imaging studies  CBC: Recent Labs  Lab 05/26/24 1616 05/27/24 0450 05/28/24 0334 05/29/24 0355 05/30/24 0412  WBC 10.5 11.5* 10.8* 10.1 8.2  NEUTROABS 6.4 7.0 6.4 5.1 4.6  HGB 16.0* 16.1* 15.8* 15.0 14.9  HCT  49.7* 49.3* 48.7* 46.1* 46.2*  MCV 92.2 91.3 92.6 92.6 93.3  PLT 68* 68* 87* 103* 102*   Basic Metabolic Panel: Recent Labs  Lab 05/25/24 0316 05/25/24 0805 05/26/24 0522 05/26/24 1616 05/27/24 0450 05/28/24 0334 05/29/24 0355 05/30/24 0412  NA  --    < > 138 136 133* 135 133* 134*  K  --    < > 5.5* 4.0 3.8 3.7 4.3 3.7  CL  --    < > 105 104 103 101 103 102  CO2  --    < > 21* 20* 21* 26 21* 19*  GLUCOSE  --    < > 98 114* 95 98 100* 83  BUN  --    < > 17 14 13 15 16 18   CREATININE  --    < > 1.15* 1.05* 1.05* 1.34* 1.36* 1.38*  CALCIUM   --    < > 8.2* 7.9* 7.7* 7.4* 7.5* 7.5*  MG 2.1  --  2.3  --   --   --   --   --    < > =  values in this interval not displayed.     Recent Results (from the past 240 hours)  Gastrointestinal Panel by PCR , Stool     Status: None   Collection Time: 05/23/24  2:38 AM   Specimen: Stool  Result Value Ref Range Status   Campylobacter species NOT DETECTED NOT DETECTED Final   Plesimonas shigelloides NOT DETECTED NOT DETECTED Final   Salmonella species NOT DETECTED NOT DETECTED Final   Yersinia enterocolitica NOT DETECTED NOT DETECTED Final   Vibrio species NOT DETECTED NOT DETECTED Final   Vibrio cholerae NOT DETECTED NOT DETECTED Final   Enteroaggregative E coli (EAEC) NOT DETECTED NOT DETECTED Final   Enteropathogenic E coli (EPEC) NOT DETECTED NOT DETECTED Final   Enterotoxigenic E coli (ETEC) NOT DETECTED NOT DETECTED Final   Shiga like toxin producing E coli (STEC) NOT DETECTED NOT DETECTED Final   Shigella/Enteroinvasive E coli (EIEC) NOT DETECTED NOT DETECTED Final   Cryptosporidium NOT DETECTED NOT DETECTED Final   Cyclospora cayetanensis NOT DETECTED NOT DETECTED Final   Entamoeba histolytica NOT DETECTED NOT DETECTED Final   Giardia lamblia NOT DETECTED NOT DETECTED Final   Adenovirus F40/41 NOT DETECTED NOT DETECTED Final   Astrovirus NOT DETECTED NOT DETECTED Final   Norovirus GI/GII NOT DETECTED NOT DETECTED Final    Rotavirus A NOT DETECTED NOT DETECTED Final   Sapovirus (I, II, IV, and V) NOT DETECTED NOT DETECTED Final    Comment: Performed at [facility], [address] [place], [place] [address]  C Difficile Quick Screen w PCR reflex     Status: None   Collection Time: 05/23/24  2:38 AM   Specimen: Stool  Result Value Ref Range Status   C Diff antigen NEGATIVE NEGATIVE Final   C Diff toxin NEGATIVE NEGATIVE Final   C Diff interpretation No C. difficile detected.  Final    Comment: Performed at [facility], [address] [place], [place] [address]  MRSA Next Gen by PCR, Nasal     Status: Abnormal   Collection Time: 05/26/24 11:27 AM   Specimen: Nasal Mucosa; Nasal Swab  Result Value Ref Range Status   MRSA by PCR Next Gen DETECTED (A) NOT DETECTED Final    Comment: RESULT CALLED TO, READ BACK BY AND VERIFIED WITH: RN PRECIOUS S ON 05/26/24 @ 1440 BY DRT (NOTE) The GeneXpert MRSA Assay (FDA approved for NASAL specimens only), is one component of a comprehensive MRSA colonization surveillance program. It is not intended to diagnose MRSA infection nor to guide or monitor treatment for MRSA infections. Test performance is not FDA approved in patients less than 5 years old. Performed at [facility], [address] [place], [place] [address]      Radiology Studies: No results found.   Scheduled Meds:  amiodarone   200 mg Oral Daily   apixaban   5 mg Oral BID   Chlorhexidine  Gluconate Cloth  6 each Topical Daily   colestipol  2 g Oral BID   dapagliflozin  propanediol  5 mg Oral Daily   famotidine   20 mg Oral Daily   gabapentin   300 mg Oral BID   insulin  aspart  0-5 Units Subcutaneous QHS   insulin  aspart  0-9 Units Subcutaneous TID WC   levothyroxine   25 mcg Oral Daily   loperamide  2 mg Oral Q8H   LORazepam   0.5 mg Oral QHS   mexiletine  300 mg Oral BID   mupirocin  ointment  1 Application Nasal BID  nystatin    Topical BID   sacubitril -valsartan   1 tablet  Oral BID   Continuous Infusions:  sodium chloride  100 mL/hr at 05/30/24 1053     LOS: 7 days   Ivonne Mustache, MD [facility] P11/14/2025, 10:56 AM

## 2024-05-30 NOTE — [place] FL2 (Signed)
 [place]  MEDICAID FL2 LEVEL OF CARE FORM     IDENTIFICATION  Patient Name: Heidi Stark Birthdate: 1941/03/07 Sex: female Admission Date (Current Location): 05/22/2024  [facility] and [place] Number:  Producer, Television/film/video and Address:  [facility] [place], [address] [place], [place] [address]      Provider Number: [id number]  Attending Physician Name and Address:  Jillian Buttery, MD  Relative Name and Phone Number:  Thomas Fuller' (Daughter)  [phone]    Current Level of Care: Hospital Recommended Level of Care: Skilled Nursing Facility Prior Approval Number:    Date Approved/Denied:   PASRR Number: [id number]  Discharge Plan: SNF    Current Diagnoses: Patient Active Problem List   Diagnosis Date Noted   Dehydration 05/27/2024   AKI (acute kidney injury) 05/23/2024   Pressure sore on heel, left, unstageable (HCC) 05/22/2024   Nausea and vomiting 05/22/2024   Pressure injury of left heel, stage 2 (HCC) 04/28/2024   Cellulitis of elbow 12/17/2023   Elbow swelling, right 12/17/2023   Olecranon bursitis 12/17/2023   Knee effusion, right 11/09/2023   Type 2 diabetes mellitus with hyperlipidemia (HCC) 05/16/2023   Hypothyroidism 05/16/2023   Blister of left ankle 05/13/2023   Compression fracture of lumbar spine, non-traumatic, sequela 05/13/2023   Acute on chronic diastolic CHF (congestive heart failure) (HCC) 05/13/2023   Diabetic nephropathy (HCC) 10/13/2021   Hyperglycemia due to type 2 diabetes mellitus (HCC) 10/13/2021   Proteinuria 10/13/2021   Pure hypercholesterolemia 10/13/2021   COVID-19 04/15/2021   Respiratory failure with hypoxia (HCC) 04/15/2021   Systolic CHF (HCC) 04/15/2021   CKD (chronic kidney disease) stage 3, GFR 30-59 ml/min (HCC) 04/15/2021   Elevated troponin 04/15/2021   Acute on chronic systolic (congestive) heart failure (HCC) 09/14/2020   Wide-complex tachycardia 09/07/2020   Ventricular tachyarrhythmia (HCC)  09/07/2020   VT (ventricular tachycardia) (HCC) 09/07/2020   Persistent atrial fibrillation (HCC) 04/30/2018   Wound of left leg 10/01/2017   Cellulitis of right lower extremity 11/27/2016   Encounter for assessment for deep vein thrombosis (DVT) 11/14/2016   Hypokalemia 08/13/2016   Type 2 diabetes mellitus with hyperglycemia, with long-term current use of insulin  (HCC)    Atherosclerosis of aorta 06/26/2016   Colitis 11/24/2014   Diarrhea 11/23/2014   Severe obesity (BMI >= 40) (HCC) 11/12/2013   Paroxysmal atrial fibrillation (HCC) 11/03/2013   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 10/28/2013   COLONIC POLYPS 08/16/2010   PAD (peripheral artery disease) 08/15/2010   HLD (hyperlipidemia) 11/18/2009   MYALGIA 11/18/2009   OBESITY 05/14/2007   PULMONARY NODULE, RIGHT MIDDLE LOBE 05/14/2007   Obstructive sleep apnea 12/06/2006   Essential hypertension 12/06/2006   COPD (chronic obstructive pulmonary disease) (HCC) 12/06/2006    Orientation RESPIRATION BLADDER Height & Weight     Self, Time, Situation, Place  Normal  (no date recorde) Weight: 250 lb (113.4 kg) Height:  5' 6 (167.6 cm)  BEHAVIORAL SYMPTOMS/MOOD NEUROLOGICAL BOWEL NUTRITION STATUS      Incontinent Diet (See dc summary)  AMBULATORY STATUS COMMUNICATION OF NEEDS Skin   Total Care Verbally Other (Comment) (ecchymosis/erythema)                       Personal Care Assistance Level of Assistance  Bathing, Dressing, Feeding Bathing Assistance: Maximum assistance Feeding assistance: Independent Dressing Assistance: Maximum assistance     Functional Limitations Info  Sight, Speech, Hearing Sight Info: Adequate Hearing Info:  Impaired Speech Info: Adequate    SPECIAL CARE FACTORS FREQUENCY  PT (By licensed PT), OT (By licensed OT)     PT Frequency: 5x/wk OT Frequency: 5x/wk            Contractures Contractures Info: Not present    Additional Factors Info  Code  Status, Allergies Code Status Info: DNR Allergies Info: Neomycin-bacitracin Zn-polymyx  Sulfa Antibiotics  Ciprofloxacin   Spironolactone            Current Medications (05/30/2024):  This is the current hospital active medication list Current Facility-Administered Medications  Medication Dose Route Frequency Provider Last Rate Last Admin   0.9 %  sodium chloride  infusion   Intravenous Continuous Jillian Buttery, MD 100 mL/hr at 05/30/24 1053 New Bag at 05/30/24 1053   acetaminophen  (TYLENOL ) tablet 500 mg  500 mg Oral Q6H PRN Samtani, Jai-Gurmukh, MD       amiodarone  (PACERONE ) tablet 200 mg  200 mg Oral Daily Emokpae, Ejiroghene E, MD   200 mg at 05/30/24 9176   apixaban  (ELIQUIS ) tablet 5 mg  5 mg Oral BID Emokpae, Ejiroghene E, MD   5 mg at 05/30/24 0825   Chlorhexidine  Gluconate Cloth 2 % PADS 6 each  6 each Topical Daily Samtani, Jai-Gurmukh, MD   6 each at 05/30/24 0826   colestipol (COLESTID) tablet 2 g  2 g Oral BID Armbruster, Steven P, MD   2 g at 05/30/24 9175   dapagliflozin  propanediol (FARXIGA ) tablet 5 mg  5 mg Oral Daily Samtani, Jai-Gurmukh, MD   5 mg at 05/30/24 9176   famotidine  (PEPCID ) tablet 20 mg  20 mg Oral Daily Samtani, Jai-Gurmukh, MD   20 mg at 05/30/24 9176   gabapentin  (NEURONTIN ) capsule 300 mg  300 mg Oral BID Jillian Buttery, MD   300 mg at 05/30/24 9176   guaiFENesin -dextromethorphan (ROBITUSSIN DM) 100-10 MG/5ML syrup 5 mL  5 mL Oral Q4H PRN Samtani, Jai-Gurmukh, MD       insulin  aspart (novoLOG ) injection 0-5 Units  0-5 Units Subcutaneous QHS Emokpae, Ejiroghene E, MD       insulin  aspart (novoLOG ) injection 0-9 Units  0-9 Units Subcutaneous TID WC Emokpae, Ejiroghene E, MD   1 Units at 05/30/24 0820   levothyroxine  (SYNTHROID ) tablet 25 mcg  25 mcg Oral Daily Emokpae, Ejiroghene E, MD   25 mcg at 05/30/24 0530   loperamide (IMODIUM) capsule 2 mg  2 mg Oral Q8H Armbruster, Elspeth SQUIBB, MD   2 mg at 05/30/24 0530   LORazepam  (ATIVAN ) tablet 0.5 mg  0.5 mg Oral  QHS Samtani, Jai-Gurmukh, MD   0.5 mg at 05/29/24 2216   mexiletine (MEXITIL ) capsule 300 mg  300 mg Oral BID Samtani, Jai-Gurmukh, MD   300 mg at 05/30/24 9175   mupirocin  ointment (BACTROBAN ) 2 % 1 Application  1 Application Nasal BID Samtani, Jai-Gurmukh, MD   1 Application at 05/30/24 0822   nystatin  (MYCOSTATIN /NYSTOP ) topical powder   Topical BID Samtani, Jai-Gurmukh, MD   Given at 05/30/24 9178   ondansetron  (ZOFRAN ) injection 4 mg  4 mg Intravenous Q4H PRN Mansy, Madison LABOR, MD       Oral care mouth rinse  15 mL Mouth Rinse PRN Samtani, Jai-Gurmukh, MD         Discharge Medications: Please see discharge summary for a list of discharge medications.  Relevant Imaging Results:  Relevant Lab Results:   Additional Information SSN: [id number]  Lendia Dais, LCSWA

## 2024-05-30 NOTE — Progress Notes (Signed)
 OT Cancellation Note  Patient Details Name: Heidi Stark MRN: [id number] DOB: 25-May-1941   Cancelled Treatment:    Reason Eval/Treat Not Completed: (P) Patient declined, Pt states she feels too tired from standing at bedside with PT earlier, will return later after lunch.   [name] 05/30/2024, 10:32 AM

## 2024-05-30 NOTE — Progress Notes (Signed)
 Physical Therapy Treatment Patient Details Name: Heidi Stark MRN: [id number] DOB: [birth year] Today's Date: 05/30/2024   History of Present Illness 83 y.o. female presents to [facility] hospital on 05/22/2024 with nausea, vomiting, diarrhea and weakness. Pt admitted for management of AKI, also with Vtach run on 11/7. PMH includes OSA, HTN, COPD, PAF, CKD III, DMII.    PT Comments  Pt received in supine and agreeable to PT session. Pt orthostatic in sitting (see vitals below), and described lightheadedness and dizziness with inc time in sitting. She was agreeable to attempt stand and side stepping at EOB, but pt unable to stand without maxA +2, and requested to sit before steps could be taken. Pt with BM and emesis at end of session and requiring min-modA for bed mobility and pericare. Discussed d/c disposition with pt, who states she would think about going to a different rehab facility than her previous one upon d/c. Continuing to recommend post-acute rehab <3hrs/day to continue improving safety with functional mobility and activity tolerance.  Vitals: Supine: 111/89 (97), 80 HR Sitting 5 min:  91/61 (72), 94 HR    If plan is discharge home, recommend the following: Two people to help with walking and/or transfers;Two people to help with bathing/dressing/bathroom;Assistance with cooking/housework;Assist for transportation   Can travel by private vehicle     No  Equipment Recommendations  None recommended by PT    Recommendations for Other Services       Precautions / Restrictions Precautions Precautions: Fall Recall of Precautions/Restrictions: Intact Precaution/Restrictions Comments: Watch BP (orthostatic in sitting 11/14) Restrictions Weight Bearing Restrictions Per Provider Order: No     Mobility  Bed Mobility Overal bed mobility: Needs Assistance Bed Mobility: Rolling, Sidelying to Sit, Sit to Sidelying Rolling: Mod assist Sidelying to sit: Used rails, Mod assist     Sit to  sidelying: Min assist General bed mobility comments: Requiring assist for trunk management for sidelying>sit and minA for sit>sidelying for elevating B LE into bed.    Transfers Overall transfer level: Needs assistance Equipment used: Rolling walker (2 wheels) Transfers: Sit to/from Stand Sit to Stand: Max assist, +2 physical assistance           General transfer comment: Pt agreeable to attempt stand with therapists. Requiring inc time and maxA +2 for STS with attempt to perform side steps at EOB, but unable as pt requesting to sit.    Ambulation/Gait                   Stairs             Wheelchair Mobility     Tilt Bed    Modified Rankin (Stroke Patients Only)       Balance Overall balance assessment: Needs assistance Sitting-balance support: Bilateral upper extremity supported, Feet supported Sitting balance-Leahy Scale: Poor Sitting balance - Comments: Requiring either intermittent minA or bolstering both hands behind self to maintain sitting. Postural control: Posterior lean Standing balance support: Bilateral upper extremity supported Standing balance-Leahy Scale: Zero Standing balance comment: Requiring maxA +2 to maintain standing.                            Communication Communication Communication: No apparent difficulties  Cognition Arousal: Alert, Lethargic Behavior During Therapy: Flat affect   PT - Cognitive impairments: No apparent impairments                       PT -  Cognition Comments: Pt attempting to close eyes in sitting and states she is feeling tired Following commands: Intact      Cueing Cueing Techniques: Verbal cues, Tactile cues  Exercises      General Comments General comments (skin integrity, edema, etc.): Pt with small amount of diarrhea at end of session. Performed pericare with pt requiring min-modA to perform. Pt with emesis at end of session as well. Orthostatics taken (see clinical  impression).      Pertinent Vitals/Pain Pain Assessment Pain Assessment: Faces Faces Pain Scale: No hurt Pain Intervention(s): Monitored during session    Home Living                          Prior Function            PT Goals (current goals can now be found in the care plan section) Acute Rehab PT Goals Patient Stated Goal: to go home PT Goal Formulation: With patient/family Time For Goal Achievement: 06/08/24 Potential to Achieve Goals: Fair Progress towards PT goals: Progressing toward goals    Frequency    Min 2X/week      PT Plan      Co-evaluation              AM-PAC PT 6 Clicks Mobility   Outcome Measure  Help needed turning from your back to your side while in a flat bed without using bedrails?: A Lot Help needed moving from lying on your back to sitting on the side of a flat bed without using bedrails?: A Lot Help needed moving to and from a bed to a chair (including a wheelchair)?: A Lot Help needed standing up from a chair using your arms (e.g., wheelchair or bedside chair)?: A Lot Help needed to walk in hospital room?: Total Help needed climbing 3-5 steps with a railing? : Total 6 Click Score: 10    End of Session Equipment Utilized During Treatment: Gait belt Activity Tolerance: Patient tolerated treatment well Patient left: in bed;with call bell/phone within reach;with bed alarm set Nurse Communication: Mobility status;Other (comment) (BM and emesis - with nurse and MD) PT Visit Diagnosis: Other abnormalities of gait and mobility (R26.89);Muscle weakness (generalized) (M62.81)     Time: 9140-9055 PT Time Calculation (min) (ACUTE ONLY): 45 min  Charges:    $Therapeutic Activity: 38-52 mins PT General Charges $$ ACUTE PT VISIT: 1 Visit                     Macenzie Burford, SPT    Yulisa Chirico 05/30/2024, 11:25 AM

## 2024-05-30 NOTE — Progress Notes (Addendum)
   Inpatient Rehab Admissions Coordinator :  Per OT therapy recommendations patient was screened for CIR candidacy by Heron Leavell RN MSN. Patient is not at a level to tolerate the intensity required to pursue a CIR admit. Noted SNF in process. Please contact me with any questions.  Heron Leavell RN MSN Admissions Coordinator [phone]   I spoke with patient's daughter by phone and clairfied her questions concerning my recommendations.

## 2024-05-30 NOTE — Evaluation (Addendum)
 Occupational Therapy Evaluation Patient Details Name: Heidi Stark MRN: [id number] DOB: [birth year] Today's Date: 05/30/2024   History of Present Illness   83 y.o. female presents to [facility] hospital on 05/22/2024 with nausea, vomiting, diarrhea and weakness. Pt admitted for management of AKI, also with Vtach run on 11/7. PMH includes OSA, HTN, COPD, PAF, CKD III, DMII.     Clinical Impressions Pt c/o fatigue, BLE weakness. Pt live at home with daughter, ramped entrance, PLOF sleeps in recliner and transfers to w/c. Pt currently with significant weakness, max A x2 for STS from elevated surface with Stedy, not able to stand from standard recliner or bed height. Pt total A for LB ADLs, mod A for UB dressing/bathing sitting EOB. Pt had no dizziness upon sitting or standing with Stedy, BP dropped to 88/67 standing but able to stand using Stedy for several minutes.   Pt tolerated session well, able to sit EOB as needed and participates well. Pt reports a few days ago she was able to step pivot to transport chair from lift chair with set up/supervision. Pt would benefit from postacute intensive rehab >3hrs/day to improve functional strength prior to returning home, has 24/7 support at home. Spoke with Pt at length about benefits of postacute rehab, she is open to inpatient postacute rehab. Pt has had bad experiences with SNF in past but open to different locations if not able to qualify for CIR. Will continue to see acutely to progress as able. Pt will need [facility] for return home.     If plan is discharge home, recommend the following:   Two people to help with walking and/or transfers;A lot of help with bathing/dressing/bathroom;Assistance with cooking/housework;Assist for transportation;Help with stairs or ramp for entrance     Functional Status Assessment   Patient has had a recent decline in their functional status and demonstrates the ability to make significant improvements in function in a  reasonable and predictable amount of time.     Equipment Recommendations   BSC/3in1     Recommendations for Other Services   Rehab consult     Precautions/Restrictions   Precautions Precautions: Fall Recall of Precautions/Restrictions: Intact Precaution/Restrictions Comments: Watch BP (orthostatic in sitting 11/14) Restrictions Weight Bearing Restrictions Per Provider Order: No     Mobility Bed Mobility Overal bed mobility: Needs Assistance Bed Mobility: Supine to Sit, Sit to Supine   Sidelying to sit: Used rails, Mod assist   Sit to supine: Max assist, Used rails   General bed mobility comments: mod A to assist to EOB, max A for return for assist with BLEs, typically sleeps in recliner at home    Transfers Overall transfer level: Needs assistance Equipment used: Ambulation equipment used Transfers: Sit to/from Stand Sit to Stand: Max assist, +2 physical assistance, From elevated surface, Via lift equipment           General transfer comment: max A x2 from elevated surface with Stedy for STS Transfer via Lift Equipment: Stedy    Balance Overall balance assessment: Needs assistance Sitting-balance support: No upper extremity supported, Feet supported Sitting balance-Leahy Scale: Fair Sitting balance - Comments: once positioned sitting EOB, able to sit without UE support   Standing balance support: Bilateral upper extremity supported, During functional activity, Reliant on assistive device for balance Standing balance-Leahy Scale: Zero Standing balance comment: reliant on use of Stedy and max A x2  ADL either performed or assessed with clinical judgement   ADL Overall ADL's : Needs assistance/impaired Eating/Feeding: Independent   Grooming: Set up;Sitting;Bed level   Upper Body Bathing: Maximal assistance;Sitting   Lower Body Bathing: Total assistance;Sitting/lateral leans;Bed level   Upper Body Dressing :  Moderate assistance;Maximal assistance;Sitting   Lower Body Dressing: Total assistance;Sitting/lateral leans;Bed level       Toileting- Clothing Manipulation and Hygiene: Total assistance;Bed level         General ADL Comments: Pt bed level or EOB for ADLs, not able to functionally stand. Was able to stand from significantly elevated bed with Stedy.     Vision         Perception         Praxis         Pertinent Vitals/Pain Pain Assessment Pain Assessment: No/denies pain     Extremity/Trunk Assessment Upper Extremity Assessment Upper Extremity Assessment: Generalized weakness   Lower Extremity Assessment Lower Extremity Assessment: Defer to PT evaluation       Communication Communication Communication: No apparent difficulties   Cognition Arousal: Alert Behavior During Therapy: WFL for tasks assessed/performed Cognition: No apparent impairments                               Following commands: Intact       Cueing  General Comments   Cueing Techniques: Verbal cues;Tactile cues  Pt BP supine 102/55(68), sitting 113/79(90), standing with Stedy 88/67(75), c/o no symptoms   Exercises     Shoulder Instructions      Home Living Family/patient expects to be discharged to:: Private residence Living Arrangements: Children Available Help at Discharge: Family;Available 24 hours/day Type of Home: Other(Comment) Home Access: Stairs to enter;Ramped entrance Entrance Stairs-Number of Steps: 3 Entrance Stairs-Rails: None Home Layout: One level     Bathroom Shower/Tub: Producer, Television/film/video: Handicapped height Bathroom Accessibility: Yes   Home Equipment: Agricultural Consultant (2 wheels);Rollator (4 wheels);BSC/3in1;Lift chair;Grab bars - toilet;Wheelchair - manual          Prior Functioning/Environment Prior Level of Function : Needs assist             Mobility Comments: sleeps in lift chair, performs SPT to [facility] and utilizes  the wheelchair around the home. Wheelchair will not fit in her bathroom so she holds onto grab bars to get to the commode. Assist for shower transfers ADLs Comments: daughter assists with IADLs, lower body dressing.    OT Problem List: Decreased strength;Decreased range of motion;Decreased activity tolerance;Impaired balance (sitting and/or standing);Decreased safety awareness;Obesity   OT Treatment/Interventions: Self-care/ADL training;Therapeutic exercise;Energy conservation;DME and/or AE instruction;Therapeutic activities;Patient/family education;Balance training      OT Goals(Current goals can be found in the care plan section)   Acute Rehab OT Goals Patient Stated Goal: to improve BLE strength OT Goal Formulation: With patient Time For Goal Achievement: 06/13/24 Potential to Achieve Goals: Good ADL Goals Pt Will Perform Upper Body Dressing: with set-up;sitting Pt Will Transfer to Toilet: with min assist;stand pivot transfer;bedside commode Pt Will Perform Toileting - Clothing Manipulation and hygiene: with min assist;sitting/lateral leans   OT Frequency:  Min 2X/week    Co-evaluation              AM-PAC OT 6 Clicks Daily Activity     Outcome Measure Help from another person eating meals?: None Help from another person taking care of personal grooming?: A Little Help from another person  toileting, which includes using toliet, bedpan, or urinal?: Total Help from another person bathing (including washing, rinsing, drying)?: A Lot Help from another person to put on and taking off regular upper body clothing?: A Lot Help from another person to put on and taking off regular lower body clothing?: Total 6 Click Score: 13   End of Session Equipment Utilized During Treatment: Other (comment) Laurent) Nurse Communication: Mobility status  Activity Tolerance: Patient tolerated treatment well Patient left: in bed;with call bell/phone within reach;with bed alarm set  OT Visit  Diagnosis: Unsteadiness on feet (R26.81);Other abnormalities of gait and mobility (R26.89);Muscle weakness (generalized) (M62.81)                Time: 8781-8744 OT Time Calculation (min): 37 min Charges:  OT General Charges $OT Visit: 1 Visit OT Evaluation $OT Eval Moderate Complexity: 1 Mod OT Treatments $Self Care/Home Management : 8-22 mins  [address] OTR/L   [name] 05/30/2024, 2:12 PM

## 2024-05-30 NOTE — TOC Progression Note (Signed)
 Transition of Care [facility]) - Progression Note    Patient Details  Name: Heidi Stark MRN: [id number] Date of Birth: 1940-09-23  Transition of Care [facility]) CM/SW Contact  Heidi Stark, Heidi Stark Phone Number: 05/30/2024, 4:13 PM  Clinical Narrative: CSW spoke to pt at bedside and introduced self and role. CSW informed pt of PT recs of STR at a SNF. Pt was agreeable to STR.  MD informed CSW via secure chat that pt's son had concerns about SNF d/t bad experience.  1345 - CSW spoke to pt and pt's daughter Heidi Stark at bedside and inquired about previous experience. Pt stated that they were previously at [facility] place and did not want to return and had a preference of [facility]. CSW explained process of sending out referrals and waiting for bed offers.   CSW sent out referrals in the [place] and medicare.gov list was given to pt's daughter.  TOC will continue to follow.                      Expected Discharge Plan and Services                                               Social Drivers of Health (SDOH) Interventions SDOH Screenings   Food Insecurity: No Food Insecurity (05/23/2024)  Housing: Low Risk  (05/23/2024)  Transportation Needs: No Transportation Needs (05/23/2024)  Utilities: Not At Risk (05/23/2024)  Alcohol Screen: Low Risk  (10/13/2021)  Depression (PHQ2-9): Medium Risk (05/06/2024)  Financial Resource Strain: Medium Risk (10/13/2021)  Physical Activity: Insufficiently Active (10/13/2021)  Social Connections: Moderately Integrated (05/23/2024)  Stress: No Stress Concern Present (10/13/2021)  Tobacco Use: Medium Risk (05/22/2024)    Readmission Risk Interventions    05/27/2024    1:34 PM 11/12/2023   10:09 AM  Readmission Risk Prevention Plan  Transportation Screening Complete Complete  PCP or Specialist Appt within 3-5 Days Complete Complete  HRI or Home Care Consult Complete Complete  Social Work Consult for Recovery Care  Planning/Counseling Complete Complete  Palliative Care Screening Not Applicable Not Applicable  Medication Review Oceanographer) Referral to Pharmacy Complete

## 2024-05-31 ENCOUNTER — Inpatient Hospital Stay (HOSPITAL_COMMUNITY)

## 2024-05-31 DIAGNOSIS — N179 Acute kidney failure, unspecified: Secondary | ICD-10-CM | POA: Diagnosis not present

## 2024-05-31 LAB — BASIC METABOLIC PANEL WITH GFR
Anion gap: 13 (ref 5–15)
BUN: 16 mg/dL (ref 8–23)
CO2: 14 mmol/L — ABNORMAL LOW (ref 22–32)
Calcium: 7.4 mg/dL — ABNORMAL LOW (ref 8.9–10.3)
Chloride: 106 mmol/L (ref 98–111)
Creatinine, Ser: 1.04 mg/dL — ABNORMAL HIGH (ref 0.44–1.00)
GFR, Estimated: 54 mL/min — ABNORMAL LOW (ref >60)
Glucose, Bld: 85 mg/dL (ref 70–99)
Potassium: 4.1 mmol/L (ref 3.5–5.1)
Sodium: 133 mmol/L — ABNORMAL LOW (ref 135–145)

## 2024-05-31 LAB — CBC WITH DIFFERENTIAL/PLATELET
Abs Immature Granulocytes: 0.06 K/uL (ref 0.00–0.07)
Basophils Absolute: 0 K/uL (ref 0.0–0.1)
Basophils Relative: 0 %
Eosinophils Absolute: 0.1 K/uL (ref 0.0–0.5)
Eosinophils Relative: 1 %
HCT: 48.8 % — ABNORMAL HIGH (ref 36.0–46.0)
Hemoglobin: 15.5 g/dL — ABNORMAL HIGH (ref 12.0–15.0)
Immature Granulocytes: 1 %
Lymphocytes Relative: 24 %
Lymphs Abs: 2.6 K/uL (ref 0.7–4.0)
MCH: 30.2 pg (ref 26.0–34.0)
MCHC: 31.8 g/dL (ref 30.0–36.0)
MCV: 94.9 fL (ref 80.0–100.0)
Monocytes Absolute: 2.3 K/uL — ABNORMAL HIGH (ref 0.1–1.0)
Monocytes Relative: 21 %
Neutro Abs: 5.7 K/uL (ref 1.7–7.7)
Neutrophils Relative %: 53 %
Platelets: 121 K/uL — ABNORMAL LOW (ref 150–400)
RBC: 5.14 MIL/uL — ABNORMAL HIGH (ref 3.87–5.11)
RDW: 16.5 % — ABNORMAL HIGH (ref 11.5–15.5)
WBC: 10.8 K/uL — ABNORMAL HIGH (ref 4.0–10.5)
nRBC: 0 % (ref 0.0–0.2)

## 2024-05-31 LAB — GLUCOSE, CAPILLARY
Glucose-Capillary: 125 mg/dL — ABNORMAL HIGH (ref 70–99)
Glucose-Capillary: 102 mg/dL — ABNORMAL HIGH (ref 70–99)
Glucose-Capillary: 98 mg/dL (ref 70–99)
Glucose-Capillary: 113 mg/dL — ABNORMAL HIGH (ref 70–99)

## 2024-05-31 MED ORDER — SODIUM CHLORIDE 0.9 % IV BOLUS
1000.0000 mL | Freq: Once | INTRAVENOUS | Status: AC
  Administered 2024-05-31: 1000 mL via INTRAVENOUS

## 2024-05-31 MED ORDER — MIDODRINE HCL 5 MG PO TABS
10.0000 mg | ORAL_TABLET | Freq: Three times a day (TID) | ORAL | Status: DC
  Administered 2024-06-01 – 2024-06-05 (×11): 10 mg via ORAL
  Filled 2024-05-31 (×12): qty 2

## 2024-05-31 MED ORDER — TAMSULOSIN HCL 0.4 MG PO CAPS
0.4000 mg | ORAL_CAPSULE | Freq: Every day | ORAL | Status: DC
  Administered 2024-05-31: 0.4 mg via ORAL
  Filled 2024-05-31: qty 1

## 2024-05-31 MED ORDER — MIDODRINE HCL 5 MG PO TABS
5.0000 mg | ORAL_TABLET | Freq: Three times a day (TID) | ORAL | Status: DC
  Administered 2024-05-31 (×2): 5 mg via ORAL
  Filled 2024-05-31 (×2): qty 1

## 2024-05-31 MED ORDER — STERILE WATER FOR INJECTION IV SOLN
INTRAVENOUS | Status: DC
  Filled 2024-05-31: qty 150
  Filled 2024-05-31 (×3): qty 1000

## 2024-05-31 MED ORDER — CHLORHEXIDINE GLUCONATE CLOTH 2 % EX PADS
6.0000 | MEDICATED_PAD | Freq: Every day | CUTANEOUS | Status: DC
  Administered 2024-06-01 – 2024-06-04 (×4): 6 via TOPICAL

## 2024-05-31 NOTE — Progress Notes (Signed)
  Patient Name: Heidi Stark Date of Encounter: 05/31/2024  Primary Cardiologist: Heidi Shuck, MD Electrophysiologist: Heidi Gladis Norton, MD  Interval Summary   The patient is doing well today.  At this time, the patient denies chest pain, shortness of breath, or any new concerns.  Vital Signs    Vitals:   05/30/24 2146 05/30/24 2154 05/31/24 0543 05/31/24 0807  BP: (!) 164/125 (!) 92/45 105/60 (S) (!) 104/57  Pulse: 77 74 (!) 48 85  Resp: 14  18 19   Temp: 98 F (36.7 C)  97.6 F (36.4 C) (!) 97.4 F (36.3 C)  TempSrc:   Oral Oral  SpO2: 96%  93% 97%  Weight:      Height:        Intake/Output Summary (Last 24 hours) at 05/31/2024 1343 Last data filed at 05/31/2024 1319 Gross per 24 hour  Intake 1451.53 ml  Output 500 ml  Net 951.53 ml   Filed Weights   05/22/24 2118  Weight: 113.4 kg    Physical Exam    GEN- NAD, Alert and oriented  Lungs- Clear to ausculation bilaterally, normal work of breathing Cardiac- Irregularly irregular rate and rhythm, no murmurs, rubs or gallops GI- soft, NT, ND, + BS Extremities- no clubbing or cyanosis. No edema  Telemetry    AF 60-70's (personally reviewed)  Hospital Course    Heidi Stark is a 83 y.o. female with PMH of HFrecEF, VT s/p ICD now complicated by RV lead malfunction, AF, HTN, DM admitted for GI illness and electrolyte disturbances.  She had missed oral medications due to GI illness and subsequently had episodes of MMVT.    Assessment & Plan    Elevated RV impedance No noise to raise concern for inappropriate shock. Therapies still on A chronic issue Patient is scheduled for elective RV lead replacement, though with recent VT, replacement prior to DC (if medically stable) would be reasonable Continue amiodarone , mexilitine  Persistent atrial fibrillation Recent interrogation suggest a very high burden of atrial fibrillation EKG, however, November 4 showed sinus rhythm Prior embolic phenomena -  continue eliquis  5  Monomorphic VT   VT prior to admit fell below her VT detection zone The VT detection zone was changed to increase sensitivity (VT-1 changed from 176 to 150 bpm)  CHF recovered EF Entresto , lasix  held  History of cirrhosis Per primary Platelets, albumin low  Hypotension Admitted with emesis, diarrhea Diarrhea resolved Still with hypotension    Signed,  Heidi FORBES Furbish, MD  05/31/2024 1:43 PM

## 2024-05-31 NOTE — Progress Notes (Signed)
 PROGRESS NOTE  DUSTINA SCOGGIN  FMW:[id number] DOB: 1940/09/03 DOA: 05/22/2024 PCP: [name], DO   Brief Narrative: Patient is a 83 year old female with history of diabetes type 2, HFrEF, VT status post ICD Medtronic placement taking amiodarone , cardioembolic splenic/renal infarcts, atrial fibrillation status post ablation, peripheral artery disease, coronary arterydisease, OSA on CPAP, CKD stage III, hypothyroidism who presented with nausea, vomiting, weakness, diarrhea.  Found to be dehydrated on presentation.  GI consulted.  EP were also following due to concern for VT. Hospital course notable for persistent diarrhea, now resolved. PT recommending SNF on discharge.    .Assessment & Plan:  Principal Problem:   AKI (acute kidney injury) Active Problems:   CKD (chronic kidney disease) stage 3, GFR 30-59 ml/min (HCC)   COPD (chronic obstructive pulmonary disease) (HCC)   Paroxysmal atrial fibrillation (HCC)   Obstructive sleep apnea   Type 2 diabetes mellitus with hyperlipidemia (HCC)   Essential hypertension   Hypothyroidism   Diarrhea   Encounter for assessment for deep vein thrombosis (DVT)   Diabetic nephropathy (HCC)   Nausea and vomiting   Dehydration  AKI on CKD stage IIIb: From volume depletion from vomiting, diarrhea.  Currently kidney function better than baseline  Diarrheal illness: GI was  following.  Taking Mounjaro at home.  C. difficile /gI pathogen negative.  Currently on Imodium, colestipol.  Fecal calprotectin boderline of 61.. There was  plan for possible colonoscopy./EGD but since diarrhea has improved, it has been canceled.  No loose stools today.  GI recommended to discontinue Mounjaro permanently on discharge.  Continue Imodium, colestipol.    Orthostatic hypotension/Hypotension: Blood pressure soft and she is orthostatic as per PT eval.  Started on IV fluids, given 500 mL bolus of fluid on 11/13.  Fluids changed to sodium bicarb due to NAGMA.  Continue  gentle IV fluids for today.  Urinary retention: Unclear etiology.  Started on Flomax.  Currently on intermittent cath.  Urine bladder scan does not show significant amount of urine, about 175 mL.  Kidney function improved from yesterday.  Will check renal ultrasound/bladder ultrasound.  If she continues to retain,we wiill put a Foley  V. tach: Has ICD.  Takes amiodarone , metoprolol , Mexitil .  EP was consulted, PPM adjusted.  EP planning for placement of new RV lead on 06/05/2024 as an outpatient.  Since blood pressure is soft, will discontinue metoprolol .  Cardiology reconsulted  Paroxysmal A-fib: Remains in A-fib rhythm with controlled rate.  Continue, amiodarone .Metoprolol  on hold  HFmrEF: Improved EF as per recent echo in June 2025 of 70 to 75%.  Takes Lasix , Entresto , Farxiga , Inspra  at home.  Currently Lasix , Entresto , Inspra  on hold  COPD: Currently not in exacerbation.  Continue bronchodilators as needed  Diabetes type 2: .  Mounjaro will be discontinued.  A1c of 6.2.   Takes Farxiga , Mounjaro ,Humalog  10 units twice daily at home.  Hypothyroidism: Continue Synthyroid at 25 mcg.  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  Hepatitis panel negative.   Thrombocytopenia: Likely associated with history of  cirrhosis or acute illness.  Case was discussed with hematology  UTI: UA showed large leukocytes, no nitrates.  Urine culture pending.  On ceftriaxone , completed 3 days course. No dysuria  Debility/deconditioning: PT recommended SNF on discharge.  Mostly nonambulatory.  Wheelchair dependent.  Lives with daughter  Morbid obesity: BMI of  40.3        Wound 05/23/24 0632 Pressure Injury Buttocks Left Stage 2 -  Partial thickness loss of dermis presenting  as a shallow open injury with a red, pink wound bed without slough. (Active)     Wound 05/23/24 0556 Pressure Injury Ankle Left;Posterior Stage 2 -  Partial thickness loss of dermis presenting as a shallow open injury with a red,  pink wound bed without slough. (Active)    DVT prophylaxis:Place TED hose Start: 05/30/24 0953 apixaban  (ELIQUIS ) tablet 5 mg     Code Status: Limited: Do not attempt resuscitation (DNR) -DNR-LIMITED -Do Not Intubate/DNI   Family Communication: Called and discussed with son [name] on phone on 11/13,11/14.  Discussed with daughter at bedside on 11/15   Patient status: Inpatient  Patient is from : Home  Anticipated discharge to: SNF  Estimated DC date: 1-2 days   Consultants: GI  Procedures:None yet  Antimicrobials:  Anti-infectives (From admission, onward)    Start     Dose/Rate Route Frequency Ordered Stop   05/27/24 1745  cefTRIAXone  (ROCEPHIN ) 1 g in sodium chloride  0.9 % 100 mL IVPB        1 g 200 mL/hr over 30 Minutes Intravenous Every 24 hours 05/27/24 1657 05/29/24 1729       Subjective: Patient seen and examined at bedside today.  Overall comfortable.  Lying on bed.  Denying abdominal pain.  No nausea vomiting or diarrhea today.  Has not voided since yesterday.Bladder scan does not show significant amount of urine.  Blood pressure soft but stable.  Objective: Vitals:   05/30/24 2146 05/30/24 2154 05/31/24 0543 05/31/24 0807  BP: (!) 164/125 (!) 92/45 105/60 (!) 104/57  Pulse: 77 74 (!) 48 85  Resp: 14  18 19   Temp: 98 F (36.7 C)  97.6 F (36.4 C) (!) 97.4 F (36.3 C)  TempSrc:   Oral Oral  SpO2: 96%  93% 97%  Weight:      Height:        Intake/Output Summary (Last 24 hours) at 05/31/2024 1058 Last data filed at 05/31/2024 0700 Gross per 24 hour  Intake 1571.53 ml  Output 500 ml  Net 1071.53 ml   Filed Weights   05/22/24 2118  Weight: 113.4 kg    Examination:  General exam: Overall comfortable, not in distress,morbidly obese HEENT: PERRL Respiratory system:  no wheezes or crackles  Cardiovascular system: Irregular irregular rhythm Gastrointestinal system: Abdomen is nondistended, soft and nontender. Central nervous system: Alert and  oriented Extremities: No edema, no clubbing ,no cyanosis Skin: No rashes, no ulcers,no icterus        Data Reviewed: I have personally reviewed following labs and imaging studies  CBC: Recent Labs  Lab 05/27/24 0450 05/28/24 0334 05/29/24 0355 05/30/24 0412 05/31/24 0455  WBC 11.5* 10.8* 10.1 8.2 10.8*  NEUTROABS 7.0 6.4 5.1 4.6 5.7  HGB 16.1* 15.8* 15.0 14.9 15.5*  HCT 49.3* 48.7* 46.1* 46.2* 48.8*  MCV 91.3 92.6 92.6 93.3 94.9  PLT 68* 87* 103* 102* 121*   Basic Metabolic Panel: Recent Labs  Lab 05/25/24 0316 05/25/24 0805 05/26/24 0522 05/26/24 1616 05/27/24 0450 05/28/24 0334 05/29/24 0355 05/30/24 0412 05/31/24 0455  NA  --    < > 138   < > 133* 135 133* 134* 133*  K  --    < > 5.5*   < > 3.8 3.7 4.3 3.7 4.1  CL  --    < > 105   < > 103 101 103 102 106  CO2  --    < > 21*   < > 21* 26 21* 19* 14*  GLUCOSE  --    < >  98   < > 95 98 100* 83 85  BUN  --    < > 17   < > 13 15 16 18 16   CREATININE  --    < > 1.15*   < > 1.05* 1.34* 1.36* 1.38* 1.04*  CALCIUM   --    < > 8.2*   < > 7.7* 7.4* 7.5* 7.5* 7.4*  MG 2.1  --  2.3  --   --   --   --   --   --    < > = values in this interval not displayed.     Recent Results (from the past 240 hours)  Gastrointestinal Panel by PCR , Stool     Status: None   Collection Time: 05/23/24  2:38 AM   Specimen: Stool  Result Value Ref Range Status   Campylobacter species NOT DETECTED NOT DETECTED Final   Plesimonas shigelloides NOT DETECTED NOT DETECTED Final   Salmonella species NOT DETECTED NOT DETECTED Final   Yersinia enterocolitica NOT DETECTED NOT DETECTED Final   Vibrio species NOT DETECTED NOT DETECTED Final   Vibrio cholerae NOT DETECTED NOT DETECTED Final   Enteroaggregative E coli (EAEC) NOT DETECTED NOT DETECTED Final   Enteropathogenic E coli (EPEC) NOT DETECTED NOT DETECTED Final   Enterotoxigenic E coli (ETEC) NOT DETECTED NOT DETECTED Final   Shiga like toxin producing E coli (STEC) NOT DETECTED NOT DETECTED  Final   Shigella/Enteroinvasive E coli (EIEC) NOT DETECTED NOT DETECTED Final   Cryptosporidium NOT DETECTED NOT DETECTED Final   Cyclospora cayetanensis NOT DETECTED NOT DETECTED Final   Entamoeba histolytica NOT DETECTED NOT DETECTED Final   Giardia lamblia NOT DETECTED NOT DETECTED Final   Adenovirus F40/41 NOT DETECTED NOT DETECTED Final   Astrovirus NOT DETECTED NOT DETECTED Final   Norovirus GI/GII NOT DETECTED NOT DETECTED Final   Rotavirus A NOT DETECTED NOT DETECTED Final   Sapovirus (I, II, IV, and V) NOT DETECTED NOT DETECTED Final    Comment: Performed at [facility], [address] [place], [place] [address]  C Difficile Quick Screen w PCR reflex     Status: None   Collection Time: 05/23/24  2:38 AM   Specimen: Stool  Result Value Ref Range Status   C Diff antigen NEGATIVE NEGATIVE Final   C Diff toxin NEGATIVE NEGATIVE Final   C Diff interpretation No C. difficile detected.  Final    Comment: Performed at [facility], [address], [place], [place] [address]  MRSA Next Gen by PCR, Nasal     Status: Abnormal   Collection Time: 05/26/24 11:27 AM   Specimen: Nasal Mucosa; Nasal Swab  Result Value Ref Range Status   MRSA by PCR Next Gen DETECTED (A) NOT DETECTED Final    Comment: RESULT CALLED TO, READ BACK BY AND VERIFIED WITH: RN PRECIOUS S ON 05/26/24 @ 1440 BY DRT (NOTE) The GeneXpert MRSA Assay (FDA approved for NASAL specimens only), is one component of a comprehensive MRSA colonization surveillance program. It is not intended to diagnose MRSA infection nor to guide or monitor treatment for MRSA infections. Test performance is not FDA approved in patients less than 76 years old. Performed at [facility], [address], [place], [place] [address]   Calprotectin, Fecal     Status: None   Collection Time: 05/27/24  7:05 PM   Specimen: Stool  Result Value Ref Range Status   Calprotectin, Fecal 61 0 - 120  ug/g Final    Comment:  (NOTE) Concentration     Interpretation   Follow-Up < 5 - 50 ug/g     Normal           None >50 -120 ug/g     Borderline       Re-evaluate in 4-6 weeks    >120 ug/g     Abnormal         Repeat as clinically                                   indicated Performed At: [facility] [address] [place], [place] [id number] Jennette Shorter MD Ey:[id number]      Radiology Studies: No results found.   Scheduled Meds:  amiodarone   200 mg Oral Daily   apixaban   5 mg Oral BID   Chlorhexidine  Gluconate Cloth  6 each Topical Daily   colestipol  2 g Oral BID   dapagliflozin  propanediol  5 mg Oral Daily   famotidine   20 mg Oral Daily   gabapentin   300 mg Oral BID   insulin  aspart  0-5 Units Subcutaneous QHS   insulin  aspart  0-9 Units Subcutaneous TID WC   levothyroxine   25 mcg Oral Daily   loperamide  2 mg Oral Q8H   LORazepam   0.5 mg Oral QHS   mexiletine  300 mg Oral BID   midodrine  5 mg Oral TID WC   mupirocin  ointment  1 Application Nasal BID   nystatin    Topical BID   tamsulosin  0.4 mg Oral Daily   Continuous Infusions:  sodium bicarbonate 150 mEq in sterile water 1,150 mL infusion 75 mL/hr at 05/31/24 1000     LOS: 8 days   Ivonne Mustache, MD [facility] P11/15/2025, 10:58 AM

## 2024-05-31 NOTE — Significant Event (Signed)
 I was notified that patient became hypotensive this evening.  Systolic blood pressure was in the range of 60s. Patient seen and examined at bedside.  During my evaluation, she was comfortable, alert and oriented.  Feels little weak but overall comfortable. Repeat blood pressure was 96 mmhg systolic. Will give her 1 L of normal saline bolus. Will continue maintenance IV fluid.  Cardiology saw her this morning.  Will increase the midodrine to 10 mg 3 times daily.  Will continue to monitor blood pressure.  Patient to hold antihypertensives except for amiodarone  and mexiletine

## 2024-06-01 DIAGNOSIS — I4819 Other persistent atrial fibrillation: Secondary | ICD-10-CM | POA: Diagnosis not present

## 2024-06-01 DIAGNOSIS — I509 Heart failure, unspecified: Secondary | ICD-10-CM | POA: Diagnosis not present

## 2024-06-01 DIAGNOSIS — N179 Acute kidney failure, unspecified: Secondary | ICD-10-CM | POA: Diagnosis not present

## 2024-06-01 LAB — CBC WITH DIFFERENTIAL/PLATELET
Abs Immature Granulocytes: 0.06 K/uL (ref 0.00–0.07)
Basophils Absolute: 0 K/uL (ref 0.0–0.1)
Basophils Relative: 0 %
Eosinophils Absolute: 0 K/uL (ref 0.0–0.5)
Eosinophils Relative: 0 %
HCT: 44.5 % (ref 36.0–46.0)
Hemoglobin: 14.9 g/dL (ref 12.0–15.0)
Immature Granulocytes: 1 %
Lymphocytes Relative: 21 %
Lymphs Abs: 1.6 K/uL (ref 0.7–4.0)
MCH: 30.3 pg (ref 26.0–34.0)
MCHC: 33.5 g/dL (ref 30.0–36.0)
MCV: 90.6 fL (ref 80.0–100.0)
Monocytes Absolute: 2.1 K/uL — ABNORMAL HIGH (ref 0.1–1.0)
Monocytes Relative: 26 %
Neutro Abs: 4.1 K/uL (ref 1.7–7.7)
Neutrophils Relative %: 52 %
Platelets: 124 K/uL — ABNORMAL LOW (ref 150–400)
RBC: 4.91 MIL/uL (ref 3.87–5.11)
RDW: 16.4 % — ABNORMAL HIGH (ref 11.5–15.5)
WBC: 7.9 K/uL (ref 4.0–10.5)
nRBC: 0 % (ref 0.0–0.2)

## 2024-06-01 LAB — GLUCOSE, CAPILLARY
Glucose-Capillary: 112 mg/dL — ABNORMAL HIGH (ref 70–99)
Glucose-Capillary: 108 mg/dL — ABNORMAL HIGH (ref 70–99)
Glucose-Capillary: 93 mg/dL (ref 70–99)
Glucose-Capillary: 104 mg/dL — ABNORMAL HIGH (ref 70–99)

## 2024-06-01 LAB — BASIC METABOLIC PANEL WITH GFR
Anion gap: 13 (ref 5–15)
BUN: 15 mg/dL (ref 8–23)
CO2: 21 mmol/L — ABNORMAL LOW (ref 22–32)
Calcium: 7.1 mg/dL — ABNORMAL LOW (ref 8.9–10.3)
Chloride: 100 mmol/L (ref 98–111)
Creatinine, Ser: 0.83 mg/dL (ref 0.44–1.00)
GFR, Estimated: >60 mL/min (ref >60)
Glucose, Bld: 112 mg/dL — ABNORMAL HIGH (ref 70–99)
Potassium: 3.7 mmol/L (ref 3.5–5.1)
Sodium: 134 mmol/L — ABNORMAL LOW (ref 135–145)

## 2024-06-01 MED ORDER — SODIUM CHLORIDE 0.9 % IV BOLUS
250.0000 mL | Freq: Once | INTRAVENOUS | Status: AC
  Administered 2024-06-01: 250 mL via INTRAVENOUS

## 2024-06-01 MED ORDER — SACCHAROMYCES BOULARDII 250 MG PO CAPS
250.0000 mg | ORAL_CAPSULE | Freq: Two times a day (BID) | ORAL | Status: DC
  Administered 2024-06-01 – 2024-06-05 (×9): 250 mg via ORAL
  Filled 2024-06-01 (×13): qty 1

## 2024-06-01 NOTE — Progress Notes (Signed)
 PROGRESS NOTE  CATHE BILGER  FMW:[id number] DOB: May 05, 1941 DOA: 05/22/2024 PCP: [name], DO   Brief Narrative: Patient is a 83 year old female with history of diabetes type 2, HFrEF, VT status post ICD Med[employer] placement taking amiodarone , cardioembolic splenic/renal infarcts, atrial fibrillation status post ablation, peripheral artery disease, coronary arterydisease, OSA on CPAP, CKD stage III, hypothyroidism who presented with nausea, vomiting, weakness, diarrhea.  Found to be dehydrated on presentation.  GI consulted.  EP were also following due to concern for VT. Hospital course notable for persistent diarrhea, now resolved.  Also became hypotensive during this hospitalization, needed to held cardiac meds.  Blood pressure currently stable.  Cardiology reconsulted.  Consideration of RV lead replacement during this hospitalization.   .Assessment & Plan:  Principal Problem:   AKI (acute kidney injury) Active Problems:   CKD (chronic kidney disease) stage 3, GFR 30-59 ml/min (HCC)   COPD (chronic obstructive pulmonary disease) (HCC)   Paroxysmal atrial fibrillation (HCC)   Obstructive sleep apnea   Type 2 diabetes mellitus with hyperlipidemia (HCC)   Essential hypertension   Hypothyroidism   Diarrhea   Encounter for assessment for deep vein thrombosis (DVT)   Diabetic nephropathy (HCC)   Nausea and vomiting   Dehydration  AKI on CKD stage IIIb: From volume depletion from vomiting, diarrhea.  Currently kidney function normal  Diarrheal illness: GI was  following.  Taking Mounjaro at home.  C. difficile /gI pathogen negative.  Currently on Imodium, colestipol.  Fecal calprotectin boderline of 61.. There was  plan for possible colonoscopy./EGD but since diarrhea has improved, it has been canceled.  No loose stools today.  GI recommended to discontinue Mounjaro permanently on discharge.  Continue  colestipol.  Imodium held  Orthostatic hypotension/Hypotension: Blood  pressure was soft and she is orthostatic as per PT eval.  Started on IV fluids, given 500 mL bolus of fluid on 11/13.  Fluids changed to sodium bicarb due to NAGMA.  Again became hypotensive but asymptomatic on 11/15 evening, given a fluid bolus of 1 L.  This morning blood pressure stable.  Urinary retention: Unclear etiology.   Currently on intermittent cath.  Urine bladder scan does not show significant amount of urine, about 175 mL.  US  renal did not show any hydronephrosis.  Foley inserted on 11/15.  Will give a voiding trial tomorrow.  Kidney function stable .  She was started on Flomax but held due to low blood pressures  V. tach: Has ICD.  Takes amiodarone , metoprolol , Mexitil .  EP was consulted, PPM adjusted.  EP planning for placement of new RV lead on 06/05/2024 as an outpatient.  Since blood pressure is soft, discontinued metoprolol .  Cardiology reconsulted.  Plan to replace RV lead during this hospitalization.  Persistent A-fib: Remains in A-fib rhythm with controlled rate.  Continue, amiodarone .Metoprolol  on hold  HFmrEF: Improved EF as per recent echo in June 2025 of 70 to 75%.  She was taking Lasix , Entresto , Inspra  at home, currently on hold  COPD: Currently not in exacerbation.  Continue bronchodilators as needed  Diabetes type 2: .  Mounjaro will be discontinued.  A1c of 6.2.   Takes Farxiga , Mounjaro ,Humalog  10 units twice daily at home.  Hypothyroidism: Continue Synthyroid at 25 mcg.  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  Hepatitis panel negative.   Thrombocytopenia: Likely associated with history of  cirrhosis or acute illness.  Case was discussed with hematology  UTI: UA showed large leukocytes, no nitrates.  Urine culture pending.  On ceftriaxone , completed 3 days course. No dysuria  Debility/deconditioning: PT recommended SNF on discharge.  Mostly nonambulatory.  Wheelchair dependent.  Lives with daughter  Morbid obesity: BMI of  40.3        Wound  05/23/24 0632 Pressure Injury Buttocks Left Stage 2 -  Partial thickness loss of dermis presenting as a shallow open injury with a red, pink wound bed without slough. (Active)     Wound 05/23/24 0556 Pressure Injury Ankle Left;Posterior Stage 2 -  Partial thickness loss of dermis presenting as a shallow open injury with a red, pink wound bed without slough. (Active)    DVT prophylaxis:Place TED hose Start: 05/30/24 0953 apixaban  (ELIQUIS ) tablet 5 mg     Code Status: Limited: Do not attempt resuscitation (DNR) -DNR-LIMITED -Do Not Intubate/DNI   Family Communication: Called and discussed with son [name] on phone on 11/13,11/14.  Discussed with daughter at bedside on 11/15.  Discussed with son TIM at bedside on 11/16   Patient status: Inpatient  Patient is from : Home  Anticipated discharge to: SNF  Estimated DC date:2-3 days   Consultants: GI,cardiology  Procedures:None yet  Antimicrobials:  Anti-infectives (From admission, onward)    Start     Dose/Rate Route Frequency Ordered Stop   05/27/24 1745  cefTRIAXone  (ROCEPHIN ) 1 g in sodium chloride  0.9 % 100 mL IVPB        1 g 200 mL/hr over 30 Minutes Intravenous Every 24 hours 05/27/24 1657 05/29/24 1729       Subjective: Patient seen and examined at bedside today.  Overall comfortable.  Lying on bed.  Complains of weakness but not in any distress.  Alert and oriented.  No diarrhea since last few days.  No abdomen pain, nausea or vomiting.  Blood pressure is better today with systolic in the range of 120s.  Long discussion had with the son at bedside today about management plan.  Objective: Vitals:   05/31/24 0807 05/31/24 1857 05/31/24 2111 06/01/24 0723  BP: (S) (!) 104/57 (!) 89/66 99/61 120/64  Pulse: 85 83 85 81  Resp: 19  20 20   Temp: (!) 97.4 F (36.3 C) 98.4 F (36.9 C) 97.6 F (36.4 C) 97.7 F (36.5 C)  TempSrc: Oral Oral  Oral  SpO2: 97% 92% 95% 95%  Weight:      Height:        Intake/Output Summary (Last  24 hours) at 06/01/2024 1055 Last data filed at 06/01/2024 0818 Gross per 24 hour  Intake 1999.44 ml  Output 850 ml  Net 1149.44 ml   Filed Weights   05/22/24 2118  Weight: 113.4 kg    Examination:   General exam: Overall comfortable, not in distress, morbidly obese, weak and deconditioned HEENT: PERRL Respiratory system:  no wheezes or crackles  Cardiovascular system: Irregularly irregular rhythm Gastrointestinal system: Abdomen is nondistended, soft and nontender. Central nervous system: Alert and oriented Extremities: No edema, no clubbing ,no cyanosis Skin: No rashes, no ulcers,no icterus   GU: Foley   Data Reviewed: I have personally reviewed following labs and imaging studies  CBC: Recent Labs  Lab 05/28/24 0334 05/29/24 0355 05/30/24 0412 05/31/24 0455 06/01/24 0556  WBC 10.8* 10.1 8.2 10.8* 7.9  NEUTROABS 6.4 5.1 4.6 5.7 4.1  HGB 15.8* 15.0 14.9 15.5* 14.9  HCT 48.7* 46.1* 46.2* 48.8* 44.5  MCV 92.6 92.6 93.3 94.9 90.6  PLT 87* 103* 102* 121* 124*   Basic Metabolic Panel: Recent Labs  Lab 05/26/24 0522 05/26/24 1616  05/28/24 0334 05/29/24 0355 05/30/24 0412 05/31/24 0455 06/01/24 0556  NA 138   < > 135 133* 134* 133* 134*  K 5.5*   < > 3.7 4.3 3.7 4.1 3.7  CL 105   < > 101 103 102 106 100  CO2 21*   < > 26 21* 19* 14* 21*  GLUCOSE 98   < > 98 100* 83 85 112*  BUN 17   < > 15 16 18 16 15   CREATININE 1.15*   < > 1.34* 1.36* 1.38* 1.04* 0.83  CALCIUM  8.2*   < > 7.4* 7.5* 7.5* 7.4* 7.1*  MG 2.3  --   --   --   --   --   --    < > = values in this interval not displayed.     Recent Results (from the past 240 hours)  Gastrointestinal Panel by PCR , Stool     Status: None   Collection Time: 05/23/24  2:38 AM   Specimen: Stool  Result Value Ref Range Status   Campylobacter species NOT DETECTED NOT DETECTED Final   Plesimonas shigelloides NOT DETECTED NOT DETECTED Final   Salmonella species NOT DETECTED NOT DETECTED Final   Yersinia  enterocolitica NOT DETECTED NOT DETECTED Final   Vibrio species NOT DETECTED NOT DETECTED Final   Vibrio cholerae NOT DETECTED NOT DETECTED Final   Enteroaggregative E coli (EAEC) NOT DETECTED NOT DETECTED Final   Enteropathogenic E coli (EPEC) NOT DETECTED NOT DETECTED Final   Enterotoxigenic E coli (ETEC) NOT DETECTED NOT DETECTED Final   Shiga like toxin producing E coli (STEC) NOT DETECTED NOT DETECTED Final   Shigella/Enteroinvasive E coli (EIEC) NOT DETECTED NOT DETECTED Final   Cryptosporidium NOT DETECTED NOT DETECTED Final   Cyclospora cayetanensis NOT DETECTED NOT DETECTED Final   Entamoeba histolytica NOT DETECTED NOT DETECTED Final   Giardia lamblia NOT DETECTED NOT DETECTED Final   Adenovirus F40/41 NOT DETECTED NOT DETECTED Final   Astrovirus NOT DETECTED NOT DETECTED Final   Norovirus GI/GII NOT DETECTED NOT DETECTED Final   Rotavirus A NOT DETECTED NOT DETECTED Final   Sapovirus (I, II, IV, and V) NOT DETECTED NOT DETECTED Final    Comment: Performed at [facility], [address] [place], [place] [address]  C Difficile Quick Screen w PCR reflex     Status: None   Collection Time: 05/23/24  2:38 AM   Specimen: Stool  Result Value Ref Range Status   C Diff antigen NEGATIVE NEGATIVE Final   C Diff toxin NEGATIVE NEGATIVE Final   C Diff interpretation No C. difficile detected.  Final    Comment: Performed at [facility], [address] [place], [place] [address]  MRSA Next Gen by PCR, Nasal     Status: Abnormal   Collection Time: 05/26/24 11:27 AM   Specimen: Nasal Mucosa; Nasal Swab  Result Value Ref Range Status   MRSA by PCR Next Gen DETECTED (A) NOT DETECTED Final    Comment: RESULT CALLED TO, READ BACK BY AND VERIFIED WITH: RN PRECIOUS S ON 05/26/24 @ 1440 BY DRT (NOTE) The GeneXpert MRSA Assay (FDA approved for NASAL specimens only), is one component of a comprehensive MRSA colonization surveillance program. It is not intended to diagnose  MRSA infection nor to guide or monitor treatment for MRSA infections. Test performance is not FDA approved in patients less than 57 years old. Performed at [facility], [address] [place], [place] [address]  Calprotectin, Fecal     Status: None   Collection Time: 05/27/24  7:05 PM   Specimen: Stool  Result Value Ref Range Status   Calprotectin, Fecal 61 0 - 120 ug/g Final    Comment: (NOTE) Concentration     Interpretation   Follow-Up < 5 - 50 ug/g     Normal           None >50 -120 ug/g     Borderline       Re-evaluate in 4-6 weeks    >120 ug/g     Abnormal         Repeat as clinically                                   indicated Performed At: [facility] [address] [place], [place] [id number] Jennette Shorter MD Ey:[phone]      Radiology Studies: US  RENAL Result Date: 05/31/2024 CLINICAL DATA:  Urinary retention. Chronic kidney disease stage III. EXAM: RENAL / URINARY TRACT ULTRASOUND COMPLETE COMPARISON:  CT 05/21/2024, 06/12/2016, 11/23/2014 and ultrasound 05/24/2024 FINDINGS: Right Kidney: Renal measurements: 10.9 x 4.5 x 5.7 cm = volume: 147 mL. Echogenicity within normal limits. No mass or hydronephrosis visualized. 1 cm cortical hypodensity over the lower pole unchanged likely a small cyst. Left Kidney: Renal measurements: 11.1 x 5.6 x 6.2 cm = volume: 204 mL. Echogenicity within normal limits. No mass or hydronephrosis visualized. Bladder: Appears normal for degree of bladder distention. Prevoid volume 390 mL. Postvoid volume not recorded. Other: None. IMPRESSION: Normal size kidneys without hydronephrosis. 1 cm right renal cortical hypodensity unchanged likely a small cyst. Electronically Signed   By: Toribio Agreste M.D.   On: 05/31/2024 13:34     Scheduled Meds:  amiodarone   200 mg Oral Daily   apixaban   5 mg Oral BID   Chlorhexidine  Gluconate Cloth  6 each Topical Daily   colestipol  2 g Oral BID   dapagliflozin  propanediol  5 mg Oral Daily    famotidine   20 mg Oral Daily   gabapentin   300 mg Oral BID   insulin  aspart  0-5 Units Subcutaneous QHS   insulin  aspart  0-9 Units Subcutaneous TID WC   levothyroxine   25 mcg Oral Daily   LORazepam   0.5 mg Oral QHS   mexiletine  300 mg Oral BID   midodrine  10 mg Oral TID WC   nystatin    Topical BID   saccharomyces boulardii  250 mg Oral BID   Continuous Infusions:  sodium bicarbonate 150 mEq in sterile water 1,150 mL infusion 75 mL/hr at 06/01/24 0818     LOS: 9 days   Ivonne Mustache, MD [facility] P11/16/2025, 10:55 AM

## 2024-06-01 NOTE — Progress Notes (Signed)
 Fluid bolus completed, SBP with doppler 90, pt. Remains aa+ox4, asymptomatic  Heidi Stark

## 2024-06-01 NOTE — Progress Notes (Deleted)
  Electrophysiology Office Note:   Date:  06/01/2024  ID:  KERRIANNE JENG, DOB 1941/01/03, MRN [id number]  Primary Cardiologist: Ezra Shuck, MD Primary Heart Failure: None Electrophysiologist: Will Gladis Norton, MD *** {Click to update primary MD,subspecialty MD or APP then REFRESH:1}    History of Present Illness:   Heidi Stark is a 83 y.o. female with h/o *** seen today for {VISITTYPE:28148}  Review of systems complete and found to be negative unless listed in HPI.      EP Information / Studies Reviewed:    {EKGtoday:28818}      ICD Interrogation-  reviewed in detail today,  See PACEART report.  Device History: {INDUSTRY:28136} {Blank single:19197::Dual Chamber,Single Chamber,BiV,S-ICD} ICD implanted *** for *** History of appropriate therapy: {yes/no:20286} History of AAD therapy: {Blank single:19197::No,Yes; currently on ***,Yes; previously tolerated ***}   Risk Assessment/Calculations:   {Does this patient have ATRIAL FIBRILLATION?:[phone]}          Physical Exam:   VS:  There were no vitals taken for this visit.   Wt Readings from Last 3 Encounters:  05/22/24 250 lb (113.4 kg)  05/06/24 250 lb (113.4 kg)  04/21/24 252 lb (114.3 kg)     GEN: Well nourished, well developed in no acute distress NECK: No JVD; No carotid bruits CARDIAC: {EPRHYTHM:28826}, no murmurs, rubs, gallops RESPIRATORY:  Clear to auscultation without rales, wheezing or rhonchi  ABDOMEN: Soft, non-tender, non-distended EXTREMITIES:  No edema; No deformity   ASSESSMENT AND PLAN:    Chronic systolic dysfunction s/p {INDUSTRY:28136} {Blank single:19197::***,single chamber ICD,dual chamber ICD,CRT-D,S-ICD}  euvolemic today Stable on an appropriate medical regimen Normal ICD function See Pace Art report No changes today  Disposition:   Follow up with {EPPROVIDERS:28135::EP Team} {EPFOLLOW LE:71826}   Signed, Daphne Barrack, NP

## 2024-06-01 NOTE — TOC Progression Note (Signed)
 Transition of Care [facility]) - Progression Note    Patient Details  Name: Heidi Stark MRN: [id number] Date of Birth: 1940/09/02  Transition of Care [facility]) CM/SW Contact  Gwenn Frieze [place], [place] Phone Number: 06/01/2024, 11:51 AM  Clinical Narrative: Spoke with pt's son [name] at his request re pt's progression and dc plan. Pt's son reports he does not think pt is close enough to being medically stable to choose a SNF. Pt and pt's son requesting [facility] or [facility] [address]. [facility] referral still under review. Referral made to [employer] at charles schwab request. SW will follow.   Frieze Gwenn, MSW, LCSW [phone] (coverage)                          Expected Discharge Plan and Services                                               Social Drivers of Health (SDOH) Interventions SDOH Screenings   Food Insecurity: No Food Insecurity (05/23/2024)  Housing: Low Risk  (05/23/2024)  Transportation Needs: No Transportation Needs (05/23/2024)  Utilities: Not At Risk (05/23/2024)  Alcohol Screen: Low Risk  (10/13/2021)  Depression (PHQ2-9): Medium Risk (05/06/2024)  Financial Resource Strain: Medium Risk (10/13/2021)  Physical Activity: Insufficiently Active (10/13/2021)  Social Connections: Moderately Integrated (05/23/2024)  Stress: No Stress Concern Present (10/13/2021)  Tobacco Use: Medium Risk (05/22/2024)    Readmission Risk Interventions    05/27/2024    1:34 PM 11/12/2023   10:09 AM  Readmission Risk Prevention Plan  Transportation Screening Complete Complete  PCP or Specialist Appt within 3-5 Days Complete Complete  HRI or Home Care Consult Complete Complete  Social Work Consult for Recovery Care Planning/Counseling Complete Complete  Palliative Care Screening Not Applicable Not Applicable  Medication Review [facility]) Referral to Pharmacy Complete

## 2024-06-01 NOTE — Progress Notes (Signed)
 Patient ID: Heidi Stark, female   DOB: 07-May-1941, 83 y.o.   MRN: [id number]   Patient Name: Heidi Stark Date of Encounter: 06/01/2024  Primary Cardiologist: Ezra Shuck, MD Electrophysiologist: Will Gladis Norton, MD  Interval Summary   Patient is awake and alert.  Doppler pressures remains low with SBP in 90s.  Creatinine stable 0.85.   She is in atrial fibrillation rate 80s  Diarrhea has resolved but appetite remains very poor.   Meds:  amiodarone   200 mg Oral Daily   apixaban   5 mg Oral BID   Chlorhexidine  Gluconate Cloth  6 each Topical Daily   colestipol  2 g Oral BID   dapagliflozin  propanediol  5 mg Oral Daily   famotidine   20 mg Oral Daily   gabapentin   300 mg Oral BID   insulin  aspart  0-5 Units Subcutaneous QHS   insulin  aspart  0-9 Units Subcutaneous TID WC   levothyroxine   25 mcg Oral Daily   LORazepam   0.5 mg Oral QHS   mexiletine  300 mg Oral BID   midodrine  10 mg Oral TID WC   nystatin    Topical BID   saccharomyces boulardii  250 mg Oral BID     Vital Signs    Vitals:   05/31/24 0807 05/31/24 1857 05/31/24 2111 06/01/24 0723  BP: (S) (!) 104/57 (!) 89/66 99/61 120/64  Pulse: 85 83 85 81  Resp: 19  20 20   Temp: (!) 97.4 F (36.3 C) 98.4 F (36.9 C) 97.6 F (36.4 C) 97.7 F (36.5 C)  TempSrc: Oral Oral  Oral  SpO2: 97% 92% 95% 95%  Weight:      Height:        Intake/Output Summary (Last 24 hours) at 06/01/2024 1213 Last data filed at 06/01/2024 0818 Gross per 24 hour  Intake 1999.44 ml  Output 850 ml  Net 1149.44 ml   Filed Weights   05/22/24 2118  Weight: 113.4 kg    Physical Exam    General: Weak-appearing Neck: No JVD, no thyromegaly or thyroid  nodule.  Lungs: Clear to auscultation bilaterally with normal respiratory effort. CV: Nondisplaced PMI.  Heart regular S1/S2, no S3/S4, no murmur.  No peripheral edema.   Abdomen: Soft, nontender, no hepatosplenomegaly, no distention.  Skin: Intact without lesions or rashes.   Neurologic: Alert and oriented x 3.  Psych: Normal affect. Extremities: No clubbing or cyanosis.  HEENT: Normal.   Telemetry    AF 60-70's (personally reviewed)  Hospital Course    Heidi Stark is a 83 y.o. female with PMH of HFrecEF, VT s/p ICD now complicated by RV lead malfunction, AF, HTN, DM admitted for GI illness and electrolyte disturbances.  She had missed oral medications due to GI illness and subsequently had episodes of MMVT.    Assessment & Plan    Elevated RV impedance No noise to raise concern for inappropriate shock. Therapies still on A chronic issue Patient is scheduled for elective RV lead replacement, though with recent VT, replacement prior to discharge would be ideal.  Will discuss with Dr. Norton.  Continue amiodarone , mexilitine  Persistent atrial fibrillation Persistent atrial fibrillation, on amiodarone  200 daily.  Continue apixaban .  Would favor eventual cardioversion.   Monomorphic VT   VT prior to admit fell below her VT detection zone See above regarding placement of RV lead, ideally prior to discharge.   CHF recovered EF She is not volume overloaded on exam.  Entresto , lasix  held She remains on Farxiga .  With SBP 90s, she is now on midodrine.   Will get echo this admission.   History of cirrhosis Per primary Platelets, albumin low  Diarrhea/vomiting Patient presented with GI illness, still undefined.  Diarrhea finally resolved but still with nausea and poor apptetite.  Per primary team.   She will need SNF at discharge.   Signed,  Ezra Shuck, MD  06/01/2024 12:13 PM

## 2024-06-01 NOTE — Plan of Care (Signed)

## 2024-06-01 NOTE — Progress Notes (Signed)
 BP with dinamap 131/74, SBP with doppler on left arm 92, SBP with doppler on right arm 96, pt. Denies any dizziness or other symptoms  Heidi Stark

## 2024-06-01 NOTE — Progress Notes (Signed)
 Dr. Rolan and Dr. Jillian in to see pt., unable to obtain accurate BP with Dinamap, unable to obtain BP manually, SBP 92 with Doppler, NS 250 ml given per Dr. Rolan Lapine Asia Favata

## 2024-06-01 NOTE — Progress Notes (Signed)
 Occupational Therapy Treatment Patient Details Name: Heidi Stark MRN: [id number] DOB: [birth year] Today's Date: 06/01/2024   History of present illness 83 y.o. female presents to [facility] hospital on 05/22/2024 with nausea, vomiting, diarrhea and weakness. Pt admitted for management of AKI, also with Vtach run on 11/7. PMH includes OSA, HTN, COPD, PAF, CKD III, DMII.   OT comments  Pt. Seen for skilled OT treatment session.  Session limited to bed mobility and eob sitting secondary to pt. Not feeling well and symptomatic from low BPs.  Rolling max a, with cues for hand placement on rail.  Sidelying to sit max a.  Pt. Unable to sit eob in un supported or supported sitting.  Max trunk support from therapist while attempting sitting bp.  Assisted back to bed max a.  Pt. Attempting to pull on headboard and bed rails to assist with scooting up in bed.  Rn requesting orthostatic bps taken if able.  See readings below.    Supine: 81/65-81 Sit: 81/65-84 (unable to determine accuracy as bp machine started over after initial reading in sitting and pt. Laying back down while it was starting over)      If plan is discharge home, recommend the following:  Two people to help with walking and/or transfers;A lot of help with bathing/dressing/bathroom;Assistance with cooking/housework;Assist for transportation;Help with stairs or ramp for entrance   Equipment Recommendations       Recommendations for Other Services Rehab consult    Precautions / Restrictions Precautions Precautions: Fall Recall of Precautions/Restrictions: Intact Precaution/Restrictions Comments: Watch BP (orthostatic in sitting 11/14)       Mobility Bed Mobility Overal bed mobility: Needs Assistance Bed Mobility: Rolling, Sidelying to Sit, Sit to Sidelying Rolling: Max assist Sidelying to sit: Used rails, Max assist   Sit to supine: Max assist, Used rails Sit to sidelying: Mod assist General bed mobility comments: heavy support to  achieve sitting eob. pt. unable to sit in un supported or supported sitting, post. lob unable to correct without therapist asst. support on back    Transfers                         Balance                                           ADL either performed or assessed with clinical judgement   ADL                                              Extremity/Trunk Assessment              Vision       Perception     Praxis     Communication Communication Communication: No apparent difficulties   Cognition Arousal: Alert Behavior During Therapy: WFL for tasks assessed/performed Cognition: No apparent impairments                               Following commands: Intact        Cueing   Cueing Techniques: Verbal cues, Tactile cues  Exercises      Shoulder Instructions       General Comments  Pertinent Vitals/ Pain       Pain Assessment Pain Assessment: No/denies pain  Home Living                                          Prior Functioning/Environment              Frequency  Min 2X/week        Progress Toward Goals  OT Goals(current goals can now be found in the care plan section)  Progress towards OT goals: Progressing toward goals     Plan      Co-evaluation                 AM-PAC OT 6 Clicks Daily Activity     Outcome Measure   Help from another person eating meals?: None Help from another person taking care of personal grooming?: A Little Help from another person toileting, which includes using toliet, bedpan, or urinal?: Total Help from another person bathing (including washing, rinsing, drying)?: A Lot Help from another person to put on and taking off regular upper body clothing?: A Lot Help from another person to put on and taking off regular lower body clothing?: Total 6 Click Score: 13    End of Session    OT Visit Diagnosis: Unsteadiness  on feet (R26.81);Other abnormalities of gait and mobility (R26.89);Muscle weakness (generalized) (M62.81)   Activity Tolerance Other (comment) (limited by not feeling well, symptomatic low BP rn aware)   Patient Left in bed;with call bell/phone within reach;with bed alarm set   Nurse Communication Other (comment) (rn states ok to work with pt., requesting orthostatics if able, reports bps and pt. not feeling well. pt. close to L side of bed but states she is comfortable. pt. aware to call for assistance for repositioning if needed)        Time: 0853-0910 OT Time Calculation (min): 17 min  Charges: OT General Charges $OT Visit: 1 Visit OT Treatments $Self Care/Home Management : 8-22 mins  Randall, COTA/L Acute Rehabilitation [phone]   CHRISTELLA Nest Lorraine-COTA/L  06/01/2024, 9:23 AM

## 2024-06-02 DIAGNOSIS — R110 Nausea: Secondary | ICD-10-CM

## 2024-06-02 DIAGNOSIS — R197 Diarrhea, unspecified: Secondary | ICD-10-CM | POA: Diagnosis not present

## 2024-06-02 DIAGNOSIS — I509 Heart failure, unspecified: Secondary | ICD-10-CM | POA: Diagnosis not present

## 2024-06-02 DIAGNOSIS — I959 Hypotension, unspecified: Secondary | ICD-10-CM

## 2024-06-02 DIAGNOSIS — I480 Paroxysmal atrial fibrillation: Secondary | ICD-10-CM | POA: Diagnosis not present

## 2024-06-02 DIAGNOSIS — N179 Acute kidney failure, unspecified: Secondary | ICD-10-CM | POA: Diagnosis not present

## 2024-06-02 DIAGNOSIS — R194 Change in bowel habit: Secondary | ICD-10-CM

## 2024-06-02 LAB — CBC
HCT: 42.9 % (ref 36.0–46.0)
Hemoglobin: 14.3 g/dL (ref 12.0–15.0)
MCH: 30.4 pg (ref 26.0–34.0)
MCHC: 33.3 g/dL (ref 30.0–36.0)
MCV: 91.1 fL (ref 80.0–100.0)
Platelets: 123 K/uL — ABNORMAL LOW (ref 150–400)
RBC: 4.71 MIL/uL (ref 3.87–5.11)
RDW: 16.3 % — ABNORMAL HIGH (ref 11.5–15.5)
WBC: 7.9 K/uL (ref 4.0–10.5)
nRBC: 0.3 % — ABNORMAL HIGH (ref 0.0–0.2)

## 2024-06-02 LAB — GLUCOSE, CAPILLARY
Glucose-Capillary: 166 mg/dL — ABNORMAL HIGH (ref 70–99)
Glucose-Capillary: 165 mg/dL — ABNORMAL HIGH (ref 70–99)
Glucose-Capillary: 152 mg/dL — ABNORMAL HIGH (ref 70–99)
Glucose-Capillary: 136 mg/dL — ABNORMAL HIGH (ref 70–99)

## 2024-06-02 LAB — BASIC METABOLIC PANEL WITH GFR
Anion gap: 12 (ref 5–15)
BUN: 18 mg/dL (ref 8–23)
CO2: 22 mmol/L (ref 22–32)
Calcium: 6.9 mg/dL — ABNORMAL LOW (ref 8.9–10.3)
Chloride: 99 mmol/L (ref 98–111)
Creatinine, Ser: 0.8 mg/dL (ref 0.44–1.00)
GFR, Estimated: >60 mL/min (ref >60)
Glucose, Bld: 106 mg/dL — ABNORMAL HIGH (ref 70–99)
Potassium: 3.7 mmol/L (ref 3.5–5.1)
Sodium: 133 mmol/L — ABNORMAL LOW (ref 135–145)

## 2024-06-02 LAB — CORTISOL: Cortisol, Plasma: 32.1 ug/dL

## 2024-06-02 MED ORDER — PANTOPRAZOLE SODIUM 40 MG IV SOLR
40.0000 mg | INTRAVENOUS | Status: DC
  Administered 2024-06-02 – 2024-06-03 (×2): 40 mg via INTRAVENOUS
  Filled 2024-06-02 (×2): qty 10

## 2024-06-02 MED ORDER — COLESTIPOL HCL 1 G PO TABS
2.0000 g | ORAL_TABLET | Freq: Two times a day (BID) | ORAL | Status: DC
  Administered 2024-06-02 – 2024-06-05 (×6): 2 g via ORAL
  Filled 2024-06-02 (×8): qty 2

## 2024-06-02 MED ORDER — METOCLOPRAMIDE HCL 5 MG/ML IJ SOLN
10.0000 mg | Freq: Three times a day (TID) | INTRAMUSCULAR | Status: DC
  Administered 2024-06-02 – 2024-06-06 (×11): 10 mg via INTRAVENOUS
  Filled 2024-06-02 (×11): qty 2

## 2024-06-02 NOTE — Progress Notes (Signed)
 Daughter bedside, GI team bedside speaking to daughter at this time

## 2024-06-02 NOTE — Progress Notes (Signed)
   06/02/24 2337  BiPAP/CPAP/SIPAP  BiPAP/CPAP/SIPAP Pt Type Adult  Mask Type Full face mask  EPAP 5 cmH2O  FiO2 (%) 32 %  Flow Rate 3 lpm  Patient Home Machine No  Patient Home Mask No  Patient Home Tubing No  Auto Titrate No

## 2024-06-02 NOTE — Progress Notes (Signed)
 Medium size soft stool with red moucus like streaks present. Patient turned safely and cleaned, wound care completed to deep tissue injury (sacral) and stage 2 on R buttock. Foley catheter removed (per order) and purwick placed.   This nurse is having a difficult time establishing a reliable blood pressure on patient. Blood pressures have been attempted on each extremities automatically on all and manually on UE. All readings are different. Doppler is used for all manual BP.   Patient's daughter is bedside requested that this nurse communicates her concerns about patient's blood pressure and decline. Asking for patient to be moved from this unit. Dr. Ivonne and Rolan ntified via secure chat about daughter's concerns.

## 2024-06-02 NOTE — Progress Notes (Addendum)
 PROGRESS NOTE  Heidi Stark  FMW:[id number] DOB: 1940/08/25 DOA: 05/22/2024 PCP: [name], DO   Brief Narrative: Patient is a 83 year old female with history of diabetes type 2, HFrEF, VT status post ICD Medtronic placement taking amiodarone , cardioembolic splenic/renal infarcts, atrial fibrillation status post ablation, peripheral artery disease, coronary arterydisease, OSA on CPAP, CKD stage III, hypothyroidism who presented with nausea, vomiting, weakness, diarrhea.  Found to be dehydrated on presentation.  GI consulted.  EP were also following due to concern for VT. Hospital course notable for persistent diarrhea, now resolved.  She  became hypotensive during this hospitalization, needed to held cardiac meds.She remains asymptomatic. Cardiology reconsulted.  Consideration of RV lead replacement during this hospitalization.GI reconsulted   .Assessment & Plan:  Principal Problem:   AKI (acute kidney injury) Active Problems:   CKD (chronic kidney disease) stage 3, GFR 30-59 ml/min (HCC)   COPD (chronic obstructive pulmonary disease) (HCC)   Paroxysmal atrial fibrillation (HCC)   Obstructive sleep apnea   Type 2 diabetes mellitus with hyperlipidemia (HCC)   Essential hypertension   Hypothyroidism   Diarrhea   Encounter for assessment for deep vein thrombosis (DVT)   Diabetic nephropathy (HCC)   Nausea and vomiting   Dehydration  AKI on CKD stage IIIb: From volume depletion from vomiting, diarrhea.  Currently kidney function normal  Diarrheal illness: GI was  following.  Taking Mounjaro at home.  C. difficile /gI pathogen negative.  Currently on Imodium, colestipol.  Fecal calprotectin boderline of 61. There was  plan for possible colonoscopy./EGD but since diarrhea has improved, it was canceled.    GI recommended to discontinue Mounjaro permanently on discharge.  Continue  colestipol.  Had an episode of diarrhea again on 11/17.  GI reconsulted as per family  request  Orthostatic hypotension/Hypotension: Blood pressure was soft and she is orthostatic as per PT eval.  Started on IV fluids, given few fluid boluses.  Blood pressure systolic in the range of 90s.  There is also technical difficulty to take her blood pressure so may not be reliable.  Currently she is asymptomatic.  Will check random serum cortisol.  Urinary retention: Unclear etiology.  US  renal did not show any hydronephrosis.  Foley inserted on 11/15.  Will give a voiding trial today. Kidney function stable .  She was started on Flomax but held due to low blood pressure  V. tach: Has ICD.  Takes amiodarone , metoprolol , Mexitil .  EP was consulted, PPM adjusted.  EP planning for placement of new RV lead on 06/05/2024 as an outpatient.  Since blood pressure is soft, discontinued metoprolol .  Cardiology reconsulted.  Plan to replace RV lead during this hospitalization.  Persistent A-fib: Remains in A-fib rhythm with controlled rate.  Continue, amiodarone .Metoprolol  on hold  HFmrEF: Improved EF as per recent echo in June 2025 of 70 to 75%.  She was taking Lasix , Entresto , Inspra  at home, currently on hold  COPD: Currently not in exacerbation.  Continue bronchodilators as needed  Diabetes type 2: .  Mounjaro will be discontinued.  A1c of 6.2.   Takes Farxiga , Mounjaro ,Humalog  10 units twice daily at home.  Currently on sliding scale  Hypothyroidism: Continue Synthyroid at 25 mcg.  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  Hepatitis panel negative.   Thrombocytopenia: Likely associated with history of  cirrhosis or acute illness.  Case was discussed with hematology  UTI: UA showed large leukocytes, no nitrates.  Urine culture pending.  On ceftriaxone , completed 3 days course. No dysuria  Debility/deconditioning: PT recommended SNF on discharge.  Mostly nonambulatory.  Wheelchair dependent.  Lives with daughter  Morbid obesity: BMI of  40.3        Wound 05/23/24 0632 Pressure  Injury Buttocks Left Stage 2 -  Partial thickness loss of dermis presenting as a shallow open injury with a red, pink wound bed without slough. (Active)     Wound 05/23/24 0556 Pressure Injury Ankle Left;Posterior Stage 2 -  Partial thickness loss of dermis presenting as a shallow open injury with a red, pink wound bed without slough. (Active)     Wound 06/01/24 1515 Pressure Injury Buttocks Right;Left Deep Tissue Pressure Injury - Purple or maroon localized area of discolored intact skin or blood-filled blister due to damage of underlying soft tissue from pressure and/or shear. (Active)    DVT prophylaxis:Place TED hose Start: 05/30/24 0953 apixaban  (ELIQUIS ) tablet 5 mg     Code Status: Limited: Do not attempt resuscitation (DNR) -DNR-LIMITED -Do Not Intubate/DNI   Family Communication: Called and discussed with son [name] on phone on 11/17  Patient status: Inpatient  Patient is from : Home  Anticipated discharge to: SNF  Estimated DC date:after full work up   Consultants: GI,cardiology  Procedures:None yet  Antimicrobials:  Anti-infectives (From admission, onward)    Start     Dose/Rate Route Frequency Ordered Stop   05/27/24 1745  cefTRIAXone  (ROCEPHIN ) 1 g in sodium chloride  0.9 % 100 mL IVPB        1 g 200 mL/hr over 30 Minutes Intravenous Every 24 hours 05/27/24 1657 05/29/24 1729       Subjective: Patient seen and examined at bedside today.  Overall comfortable.  Lying in bed.  Blood pressure in the range of 90 systolic, she denies any dizziness or lightheadedness.  Cardiology considering RV lead replacement during this hospitalization.  Had an episode of diarrhea today.  Abdomen benign on examination.  Objective: Vitals:   06/01/24 1616 06/01/24 2010 06/02/24 0414 06/02/24 0420  BP: 104/69 103/68 98/69   Pulse: 78 84 81 83  Resp: 18 18 16    Temp: 98.3 F (36.8 C) 97.8 F (36.6 C) 97.6 F (36.4 C)   TempSrc: Oral Oral    SpO2: 98% 94% 90% 93%  Weight:       Height:        Intake/Output Summary (Last 24 hours) at 06/02/2024 1115 Last data filed at 06/02/2024 0600 Gross per 24 hour  Intake --  Output 400 ml  Net -400 ml   Filed Weights   05/22/24 2118  Weight: 113.4 kg    Examination:   General exam: Overall comfortable, not in distress, morbidly obese, weak HEENT: PERRL Respiratory system:  no wheezes or crackles  Cardiovascular system: Irregularly irregular rhythm Gastrointestinal system: Abdomen is nondistended, soft and nontender. Central nervous system: Alert and oriented Extremities: No edema, no clubbing ,no cyanosis Skin: No rashes, no ulcers,no icterus  GU Foley   Data Reviewed: I have personally reviewed following labs and imaging studies  CBC: Recent Labs  Lab 05/28/24 0334 05/29/24 0355 05/30/24 0412 05/31/24 0455 06/01/24 0556 06/02/24 0416  WBC 10.8* 10.1 8.2 10.8* 7.9 7.9  NEUTROABS 6.4 5.1 4.6 5.7 4.1  --   HGB 15.8* 15.0 14.9 15.5* 14.9 14.3  HCT 48.7* 46.1* 46.2* 48.8* 44.5 42.9  MCV 92.6 92.6 93.3 94.9 90.6 91.1  PLT 87* 103* 102* 121* 124* 123*   Basic Metabolic Panel: Recent Labs  Lab 05/29/24 0355 05/30/24 0412 05/31/24 0455 06/01/24  0556 06/02/24 0416  NA 133* 134* 133* 134* 133*  K 4.3 3.7 4.1 3.7 3.7  CL 103 102 106 100 99  CO2 21* 19* 14* 21* 22  GLUCOSE 100* 83 85 112* 106*  BUN 16 18 16 15 18   CREATININE 1.36* 1.38* 1.04* 0.83 0.80  CALCIUM  7.5* 7.5* 7.4* 7.1* 6.9*     Recent Results (from the past 240 hours)  MRSA Next Gen by PCR, Nasal     Status: Abnormal   Collection Time: 05/26/24 11:27 AM   Specimen: Nasal Mucosa; Nasal Swab  Result Value Ref Range Status   MRSA by PCR Next Gen DETECTED (A) NOT DETECTED Final    Comment: RESULT CALLED TO, READ BACK BY AND VERIFIED WITH: RN PRECIOUS S ON 05/26/24 @ 1440 BY DRT (NOTE) The GeneXpert MRSA Assay (FDA approved for NASAL specimens only), is one component of a comprehensive MRSA colonization surveillance program. It  is not intended to diagnose MRSA infection nor to guide or monitor treatment for MRSA infections. Test performance is not FDA approved in patients less than 67 years old. Performed at [name], [address], [place], [place] [address]   Calprotectin, Fecal     Status: None   Collection Time: 05/27/24  7:05 PM   Specimen: Stool  Result Value Ref Range Status   Calprotectin, Fecal 61 0 - 120 ug/g Final    Comment: (NOTE) Concentration     Interpretation   Follow-Up < 5 - 50 ug/g     Normal           None >50 -120 ug/g     Borderline       Re-evaluate in 4-6 weeks    >120 ug/g     Abnormal         Repeat as clinically                                   indicated Performed At: [facility] [address] [place], [place] [id number] Jennette Shorter MD Ey:[id number]      Radiology Studies: US  RENAL Result Date: 05/31/2024 CLINICAL DATA:  Urinary retention. Chronic kidney disease stage III. EXAM: RENAL / URINARY TRACT ULTRASOUND COMPLETE COMPARISON:  CT 05/21/2024, 06/12/2016, 11/23/2014 and ultrasound 05/24/2024 FINDINGS: Right Kidney: Renal measurements: 10.9 x 4.5 x 5.7 cm = volume: 147 mL. Echogenicity within normal limits. No mass or hydronephrosis visualized. 1 cm cortical hypodensity over the lower pole unchanged likely a small cyst. Left Kidney: Renal measurements: 11.1 x 5.6 x 6.2 cm = volume: 204 mL. Echogenicity within normal limits. No mass or hydronephrosis visualized. Bladder: Appears normal for degree of bladder distention. Prevoid volume 390 mL. Postvoid volume not recorded. Other: None. IMPRESSION: Normal size kidneys without hydronephrosis. 1 cm right renal cortical hypodensity unchanged likely a small cyst. Electronically Signed   By: Toribio Agreste M.D.   On: 05/31/2024 13:34     Scheduled Meds:  amiodarone   200 mg Oral Daily   apixaban   5 mg Oral BID   Chlorhexidine  Gluconate Cloth  6 each Topical Daily   colestipol  2 g Oral BID   dapagliflozin   propanediol  5 mg Oral Daily   famotidine   20 mg Oral Daily   gabapentin   300 mg Oral BID   insulin  aspart  0-5 Units Subcutaneous QHS   insulin  aspart  0-9 Units Subcutaneous TID WC   levothyroxine   25  mcg Oral Daily   LORazepam   0.5 mg Oral QHS   mexiletine  300 mg Oral BID   midodrine  10 mg Oral TID WC   nystatin    Topical BID   saccharomyces boulardii  250 mg Oral BID   Continuous Infusions:     LOS: 10 days   Ivonne Mustache, MD [facility] P11/17/2025, 11:15 AM

## 2024-06-02 NOTE — Progress Notes (Addendum)
 Progress Note  Patient Name: Heidi Stark Date of Encounter: 06/02/2024 [facility] Cardiologist: Ezra Shuck, MD   Interval Summary   Patient denied shortness of breath though she was placed on [place]. No lightheadedness, dizziness, chest pain, or palpitations.   Vital Signs Vitals:   06/01/24 1616 06/01/24 2010 06/02/24 0414 06/02/24 0420  BP: 104/69 103/68 98/69   Pulse: 78 84 81 83  Resp: 18 18 16    Temp: 98.3 F (36.8 C) 97.8 F (36.6 C) 97.6 F (36.4 C)   TempSrc: Oral Oral    SpO2: 98% 94% 90% 93%  Weight:      Height:        Intake/Output Summary (Last 24 hours) at 06/02/2024 1258 Last data filed at 06/02/2024 1245 Gross per 24 hour  Intake 1789.31 ml  Output 400 ml  Net 1389.31 ml      05/22/2024    9:18 PM 05/06/2024    5:09 PM 04/24/2024   11:46 AM  Last 3 Weights  Weight (lbs) 250 lb 250 lb --  Weight (kg) 113.399 kg 113.399 kg --      Telemetry/ECG  AF HR 88 - Personally Reviewed  Physical Exam  GEN: No acute distress laying in bed drinking coke with Worth in place.   Cardiac: irregularly irregular, no murmurs, rubs, or gallops.  Respiratory: End expiratory wheezing MS: No edema, warm extremities   Assessment & Plan  Heidi Stark is a 83 y.o. female withno significant CAD on cardiac catheterization in 08/2020, chronic HFrEF with a EF as low as 30-35% in 2018 but has since normalized, paroxysmal atrial fibrillation/ flutter s/p atrial fibrillation ablation in 2018 with recurrence requiring multiple DCCVs (most recently in 10/2022 ) on Eliquis , VT initially while on Tikosyn  in 08/2020 but had recurrence after this was stopped s/p Medtronic ICD in 09/2020, PAD with a known occluded left external iliac artery, splenic and renal infarcts in 10/2013, hypertension, hyperlipidemia, type 2 diabetes, hypothyroidism, CKD stage III, obstructive sleep apnea on CPAP, and chronic low back pain  who was admitted for GI illness and electrolyte disturbances on  11/6.  She had missed oral medications due to GI illness and subsequently had episodes of MMVT.   HFimEF Has been hypotensive this admission, on midodrine. Entresto  on hold since 11/14 2/2 hypotension Last dose of diuretic 11/11  No weight recorded since admission, will request update. Net IO Since Admission: 3,660.33 mL [06/02/24 1330] Does not appear volume up, though body habitus does limit assessment.  Echo pending Defer MRA, BB, and entresto  2/2 hypotension Defer SGLT2i given bed ridden status this admission with GI illness  Hypotension  BP: 98/69 Orthostatic vitals on 11/14 (+). Received IV fluids.  Continue midodrine 10 mg TID, started 3 days ago   Elevated RV impedance Monomorphic VT [1 minute episode on telemetry, asymptomatic] EP following, planned RV lead replacement 11/20 with Dr. Inocencio Continue on mexiletine 300 mg Continue amiodarone  200 mg  BB on hold 2/2 hypotension  Persistent Atrial fibrillation  EP following.  Currently in rate controlled AF  BB on hold 2/2 hypotension Continue amiodarone  200 mg  Continue eliquis  5 mg, will need to stop and transition to IV heparin  prior to procedure  Keep K >4 and Mag >2  PAD Minimal CAD Defer antiplatelet for chronic anticoagulation   History of cirrhosis Per primary, though recommend close LFT follow-up as she is opn amiodarone .  Per primary GI illness  AKI on CKD -improved Urinary retention with UTI  COPD T2DM Hypothyroidism  Thrombocytopenia OSA on CPAP Deconditioning    For questions or updates, please contact [facility] Please consult [email or web address] for contact info under       Signed, Leontine LOISE Salen, PA-C    ______    Patient seen and examined, note reviewed with the signed Advanced Practice Provider. I personally reviewed laboratory data, imaging studies and relevant notes. I independently examined the patient and formulated the important aspects of the plan. I have personally  discussed the plan with the patient and/or family. Comments or changes to the note/plan are indicated below.  HPI: This is an 83 year old female with past medical history of CAD, HFrEF and improved ejection fraction, paroxysmal atrial fibrillation/flutter s/p atrial fibrillation ablation in 2018 with recurrence requiring multiple cardioversions and on Eliquis , VT initially while on Tikosyn  in 2022 but had recurrence after this was stopped s/p Medtronic ICD in 2022, PAD with occluded left external iliac artery, splenic and renal infarcts in 2015, hypertension, hyperlipidemia, type 2 diabetes, CKD 3, OSA on CPAP who was admitted with GI illness and electrolyte disturbances on 11/6 leading her to miss multiple oral medications due to poor p.o. intake.  Nursing is reporting that the patient's blood pressures have been very low today and requiring Doppler assistance to get accurate blood pressure readings.  Patient feels generally very unwell today and worsened yesterday.  No requiring O2.  My Exam:  Physical Exam Vitals and nursing note reviewed.  Constitutional:      Appearance: Normal appearance. She is ill-appearing.  HENT:     Head: Normocephalic and atraumatic.  Eyes:     Conjunctiva/sclera: Conjunctivae normal.  Neck:     Vascular: No carotid bruit.  Cardiovascular:     Rate and Rhythm: Normal rate. Rhythm irregular.  Pulmonary:     Comments: Distant Musculoskeletal:        General: No swelling.  Skin:    Coloration: Skin is not jaundiced or pale.  Neurological:     Mental Status: She is alert.      Telemetry: Atrial fibrillation with RVR and monomorphic VT- Personally reviewed EKG: Atrial fibrillation with right bundle branch block morphology- Personally reviewed  Assessment & Plan:  Heart failure improved ejection fraction -BP is prohibiting advancement of GDMT and currently off MRA, beta-blocker and Entresto .  Will discontinue SGLT2 inhibitor to prevent further dehydration  and hypotension Hypotension-currently on midodrine.  Echocardiogram pending.  Okay to transfer to 6E but will defer to the hospitalist Monomorphic VT-EP on board.  On mexiletine and amiodarone  Elevated RV impedance Persistent atrial fibrillation, currently rate controlled EP following.  Currently on Eliquis  PAD CAD on Eliquis  Possible cirrhosis GI illness-GI planning on outpatient colonoscopy  Time coordinating patient care: 43 minutes  Signed, Emeline Calender, DO [place]  [facility]  06/02/2024 4:18 PM

## 2024-06-02 NOTE — Progress Notes (Addendum)
 Patient ID: Heidi Stark, female   DOB: 12-22-1940, 83 y.o.   MRN: [id number]    Progress Note   Subjective   Day # 10 CC; asked to reevaluate today for persistent nausea, poor oral intake and recurrence of diarrhea. Initially admitted on 05/23/2024 with nausea vomiting and diarrhea with syncope.  Daughter says that she has had nausea over the past 3 to 4 months, had been started on Mounjaro but says that some nausea preceded this.  She has now been off the Mounjaro for almost 3 weeks.  Onset of diarrhea about 2 weeks ago improved with antidiarrheals and colestipol here after infectious workup negative.  Significant comorbidities with chronic kidney disease with acute kidney injury superimposed, chronic congestive heart failure status post ICD, new T12 compression fracture, diabetes mellitus, COPD and atrial fibrillation.  Persistent problems with soft blood pressures here-now on midodrine and in persistent A-fib rate in the 80s to 90s.  She is felt to have right lead malfunction, and has had episodes of MM VT to be multifactoral Cardiology now discussing  right ventricular lead replacement inpatient.  Echo pending today  Daughter at bedside this afternoon, patient's nurse also at bedside.  Daughter very concerned because she has had very poor oral intake and nurse feels that she is only eating about 15% of any of her meals.  She is also continuing to have intermittent nausea and vomiting.  Diarrhea had significantly improved with colestipol but has had 2 episodes of recurrent diarrhea today and noted to have streaks of blood with this.  Patient cannot offer a lot of history, denies any abdominal pain but feeling very nauseated this afternoon  Noncontrasted CT abdomen and pelvis 05/21/2024 redundant gas containing sigmoid colon chronic right periumbilical ventral hernia containing a short segment of the transverse colon no evidence of incarceration, diverticulosis present, 6 mm gallstone      Objective   Vital signs in last 24 hours: Temp:  [97.6 F (36.4 C)-98.3 F (36.8 C)] 97.6 F (36.4 C) (11/17 0414) Pulse Rate:  [78-97] 91 (11/17 1420) Resp:  [16-18] 16 (11/17 1420) BP: (72-104)/(61-74) 95/74 (11/17 1420) SpO2:  [90 %-98 %] 97 % (11/17 1420) Last BM Date : 06/01/24 General:    Chronically ill-appearing elderly white female in NAD Heart:  irRegular rate and rhythm; no murmurs Lungs: Respirations even and unlabored, lungs CTA bilaterally Abdomen:  Soft, obese, no focal tenderness, fullness in the hypogastrium supra umbilical area. Normal bowel sounds. Extremities:  Without edema. Neurologic:  Alert and oriented,  grossly normal neurologically. Psych:  Cooperative. Normal mood and affect.  Intake/Output from previous day: 11/16 0701 - 11/17 0700 In: 1381.9 [I.V.:1381.9] Out: 400 [Urine:400] Intake/Output this shift: Total I/O In: 1909.3 [P.O.:120; I.V.:1789.3] Out: -   Lab Results: Recent Labs    05/31/24 0455 06/01/24 0556 06/02/24 0416  WBC 10.8* 7.9 7.9  HGB 15.5* 14.9 14.3  HCT 48.8* 44.5 42.9  PLT 121* 124* 123*   BMET Recent Labs    05/31/24 0455 06/01/24 0556 06/02/24 0416  NA 133* 134* 133*  K 4.1 3.7 3.7  CL 106 100 99  CO2 14* 21* 22  GLUCOSE 85 112* 106*  BUN 16 15 18   CREATININE 1.04* 0.83 0.80  CALCIUM  7.4* 7.1* 6.9*   LFT No results for input(s): PROT, ALBUMIN, AST, ALT, ALKPHOS, BILITOT, BILIDIR, IBILI in the last 72 hours. PT/INR No results for input(s): LABPROT, INR in the last 72 hours.     Assessment / Plan:     #  16 83 year old white female who we are asked to reevaluate in the setting of persistent nausea, intermittent vomiting and very poor oral intake-only taking about 15% of her meals since hospitalization over the past 10 days. Also with diarrhea which had improved with colestipol but has recurred today-negative infectious workup.  Noncontrasted CT on admission unrevealing other than showing  a solitary gallstone, no gallbladder wall thickness, and ventral hernias containing a loop of transverse colon  Etiology of the nausea and poor oral intake is not clear at this time.  She may have had underlying baseline gastroparesis, exacerbated by Mounjaro which may take several weeks to completely washout.  Rule out other medication side effects.    Most likely culprits mexiletine and amiodarone   Rule out secondary to underlying cardiac disease-echo pending today  #2 atrial fibrillation #3 history of severe congestive heart failure, had previously been on Entresto , status post ICD, had had improvement in EF.-Echo pending today  #4 episodes of V. tach possibly related to lead issue/elevated RV impedance  #5 persistent hypotension-now on midodrine-off Entresto   #6 malnutrition-need to consider alternative means of nutrition in the next day or 2 if we cannot get her nausea improved, will need core track placement   Plan stop Pepcid  and start Protonix 40 mg once daily Continue Zofran  4 mg every 6 hours as needed for nausea Will give a trial of IV metoclopramide 10 mg IV 3 times daily Consider contrasted CT now that renal function normalized She is not a candidate for any endoscopic evaluation at this time high risk for sedation with persistent hypotension  Continue colestipol  Will need to discuss amiodarone  and mexiletine with cardiology regarding any ability to decrease dosages  Leave off Mounjaro and do not restart  GI will follow with you     Principal Problem:   AKI (acute kidney injury) Active Problems:   Obstructive sleep apnea   Essential hypertension   COPD (chronic obstructive pulmonary disease) (HCC)   Paroxysmal atrial fibrillation (HCC)   Diarrhea   Encounter for assessment for deep vein thrombosis (DVT)   CKD (chronic kidney disease) stage 3, GFR 30-59 ml/min (HCC)   Diabetic nephropathy (HCC)   Type 2 diabetes mellitus with hyperlipidemia (HCC)    Hypothyroidism   Nausea and vomiting   Dehydration     LOS: 10 days   Amy Esterwood PA-C 06/02/2024, 4:13 PM    Attending physician's note   I have taken a history, reviewed the chart, and examined the patient. I performed a substantive portion of this encounter, including complete performance of at least one of the key components, in conjunction with the APP. I agree with the APP's note, impression, and recommendations with my edits.   GI service reconsulted today as she had an episode of loose stool earlier today.  In discussion with her daughter at bedside, she has also been dealing with nausea with decreased p.o. intake over the last several weeks prior to admission.  Decreased p.o. intake due to nausea, but not abdominal pain.  No emesis.  1) Nausea Patient certainly has multiple potential etiologies for nausea, to include recent Mounjaro use (can take weeks to months to fully clear), gastroparesis (although not much in the way of emesis), polypharmacy (has several medications with nausea as known ADR), and possibly secondary to non-GI comorbidities. - Will start scheduled Reglan to try to stimulate gastric motility - Continue Zofran  on demand - Will adjust acid suppression therapy - If no significant improvement in nausea, did discuss  possibly placing a core track for nutritional support - Holding Mounjaro - Will need to discuss with cardiology regarding amiodarone , mexiletine - If no improvement, CT to evaluate for extraluminal pathology - Not a good candidate for endoscopy at this juncture given active cardiac issues, need for pacemaker lead replacement, etc. - Possibly consider outpatient HIDA  2) Diarrhea Stools have overall improved since starting colestipol last week.  Otherwise negative infectious workup, fecal calprotectin normal, and recent CT without colitis.  CT from 11/5 did show chronic right periumbilical ventral abdominal hernia containing a short segment of the  transverse colon without incarceration or obstruction.  This does however increase risks of colonoscopy.  Discussed that with patient and her daughter today, and I advised against a diagnostic colonoscopy at this juncture. - Continue colestipol - As with the nausea, some of her bowel habit changes could be due to polypharmacy  3) V. tach 4) A-fib 5) CHF - Management per primary Medicine service and consulting Cardiology service   [address], DO, FACG [phone] office

## 2024-06-02 NOTE — TOC Progression Note (Signed)
 Transition of Care [facility]) - Progression Note    Patient Details  Name: Heidi Stark MRN: [id number] Date of Birth: 03-08-1941  Transition of Care [facility]) CM/SW Contact  Luise JAYSON Pan, [place] Phone Number: 06/02/2024, 1:42 PM  Clinical Narrative:   CSW called Bard with [facility] about patients referral. Per Bard, facility is not in network with [facility] medicare. CSW left VM for patients son, [name], about bed offers.   CSW spoke with Renee', pts dtr, about bed choice, CSW informed [name]' of above. Renee chose [facility] at this time. CSW will follow up on medical readiness for insurance authorization.                     Expected Discharge Plan and Services                                               Social Drivers of Health (SDOH) Interventions SDOH Screenings   Food Insecurity: No Food Insecurity (05/23/2024)  Housing: Low Risk  (05/23/2024)  Transportation Needs: No Transportation Needs (05/23/2024)  Utilities: Not At Risk (05/23/2024)  Alcohol Screen: Low Risk  (10/13/2021)  Depression (PHQ2-9): Medium Risk (05/06/2024)  Financial Resource Strain: Medium Risk (10/13/2021)  Physical Activity: Insufficiently Active (10/13/2021)  Social Connections: Moderately Integrated (05/23/2024)  Stress: No Stress Concern Present (10/13/2021)  Tobacco Use: Medium Risk (05/22/2024)    Readmission Risk Interventions    05/27/2024    1:34 PM 11/12/2023   10:09 AM  Readmission Risk Prevention Plan  Transportation Screening Complete Complete  PCP or Specialist Appt within 3-5 Days Complete Complete  HRI or Home Care Consult Complete Complete  Social Work Consult for Recovery Care Planning/Counseling Complete Complete  Palliative Care Screening Not Applicable Not Applicable  Medication Review [facility]) Referral to Pharmacy Complete

## 2024-06-02 NOTE — Progress Notes (Signed)
 Chart notes reviewed. C/w hypotension GI symptoms continue (though perhaps improving) Rate controlled AFib, 70's-80's, rare PVCs, 3 beat NSVT noted  Elevated RV impedance No noise to raise concern for inappropriate shock. Therapies still on A chronic issue Patient is scheduled for elective RV lead replacement, though with recent VT, replacement prior to discharge would be ideal.   Monomorphic VT   VT prior to admit fell below her VT detection zone The VT detection zone was changed to increase sensitivity (VT-1 changed from 176 to 150 bpm) Continue amiodarone , mexiletine    D/w Dr. Inocencio, we will follow along, we can plan to pursue RV lead revision as scheduled as long as she is clinically ready otherwise. Will check back in tomorrow > plan to hold her eliquis  starting tomorrow if clinical course seems to be improving  [name] Arthur, PA-C

## 2024-06-03 ENCOUNTER — Inpatient Hospital Stay (HOSPITAL_COMMUNITY)

## 2024-06-03 ENCOUNTER — Encounter (HOSPITAL_COMMUNITY): Admission: EM | Disposition: E | Payer: Self-pay | Source: Home / Self Care | Attending: Internal Medicine

## 2024-06-03 ENCOUNTER — Other Ambulatory Visit: Payer: Self-pay

## 2024-06-03 DIAGNOSIS — R579 Shock, unspecified: Secondary | ICD-10-CM | POA: Diagnosis not present

## 2024-06-03 DIAGNOSIS — J9601 Acute respiratory failure with hypoxia: Secondary | ICD-10-CM

## 2024-06-03 DIAGNOSIS — R339 Retention of urine, unspecified: Secondary | ICD-10-CM

## 2024-06-03 DIAGNOSIS — I5022 Chronic systolic (congestive) heart failure: Secondary | ICD-10-CM | POA: Diagnosis not present

## 2024-06-03 DIAGNOSIS — E039 Hypothyroidism, unspecified: Secondary | ICD-10-CM

## 2024-06-03 DIAGNOSIS — G4733 Obstructive sleep apnea (adult) (pediatric): Secondary | ICD-10-CM

## 2024-06-03 DIAGNOSIS — N179 Acute kidney failure, unspecified: Secondary | ICD-10-CM | POA: Diagnosis not present

## 2024-06-03 DIAGNOSIS — I509 Heart failure, unspecified: Secondary | ICD-10-CM | POA: Diagnosis not present

## 2024-06-03 DIAGNOSIS — G9341 Metabolic encephalopathy: Secondary | ICD-10-CM

## 2024-06-03 DIAGNOSIS — R918 Other nonspecific abnormal finding of lung field: Secondary | ICD-10-CM

## 2024-06-03 DIAGNOSIS — I472 Ventricular tachycardia, unspecified: Secondary | ICD-10-CM | POA: Diagnosis not present

## 2024-06-03 DIAGNOSIS — I4819 Other persistent atrial fibrillation: Secondary | ICD-10-CM | POA: Diagnosis not present

## 2024-06-03 DIAGNOSIS — I959 Hypotension, unspecified: Secondary | ICD-10-CM | POA: Diagnosis not present

## 2024-06-03 DIAGNOSIS — N1832 Chronic kidney disease, stage 3b: Secondary | ICD-10-CM

## 2024-06-03 HISTORY — PX: RIGHT HEART CATH: CATH118263

## 2024-06-03 LAB — BASIC METABOLIC PANEL WITH GFR
Anion gap: 12 (ref 5–15)
Anion gap: 17 — ABNORMAL HIGH (ref 5–15)
BUN: 27 mg/dL — ABNORMAL HIGH (ref 8–23)
BUN: 38 mg/dL — ABNORMAL HIGH (ref 8–23)
CO2: 23 mmol/L (ref 22–32)
CO2: 20 mmol/L — ABNORMAL LOW (ref 22–32)
Calcium: 7 mg/dL — ABNORMAL LOW (ref 8.9–10.3)
Calcium: 6.6 mg/dL — ABNORMAL LOW (ref 8.9–10.3)
Chloride: 97 mmol/L — ABNORMAL LOW (ref 98–111)
Chloride: 100 mmol/L (ref 98–111)
Creatinine, Ser: 1.21 mg/dL — ABNORMAL HIGH (ref 0.44–1.00)
Creatinine, Ser: 1.14 mg/dL — ABNORMAL HIGH (ref 0.44–1.00)
GFR, Estimated: 45 mL/min — ABNORMAL LOW (ref >60)
GFR, Estimated: 48 mL/min — ABNORMAL LOW (ref >60)
Glucose, Bld: 127 mg/dL — ABNORMAL HIGH (ref 70–99)
Glucose, Bld: 117 mg/dL — ABNORMAL HIGH (ref 70–99)
Potassium: 3.6 mmol/L (ref 3.5–5.1)
Potassium: 3.3 mmol/L — ABNORMAL LOW (ref 3.5–5.1)
Sodium: 132 mmol/L — ABNORMAL LOW (ref 135–145)
Sodium: 137 mmol/L (ref 135–145)

## 2024-06-03 LAB — ECHOCARDIOGRAM COMPLETE
AR max vel: 2.06 cm2
AV Area VTI: 2.22 cm2
AV Area mean vel: 2.41 cm2
AV Mean grad: 5 mmHg
AV Peak grad: 11 mmHg
Ao pk vel: 1.66 m/s
Area-P 1/2: 4.06 cm2
Height: 66 in
MV VTI: 1.31 cm2
S' Lateral: 3.3 cm
Weight: 4000 [oz_av]

## 2024-06-03 LAB — CBC
HCT: 43 % (ref 36.0–46.0)
HCT: 42.9 % (ref 36.0–46.0)
Hemoglobin: 14.5 g/dL (ref 12.0–15.0)
Hemoglobin: 14.3 g/dL (ref 12.0–15.0)
MCH: 30.4 pg (ref 26.0–34.0)
MCH: 30.3 pg (ref 26.0–34.0)
MCHC: 33.7 g/dL (ref 30.0–36.0)
MCHC: 33.3 g/dL (ref 30.0–36.0)
MCV: 90.1 fL (ref 80.0–100.0)
MCV: 90.9 fL (ref 80.0–100.0)
Platelets: 142 K/uL — ABNORMAL LOW (ref 150–400)
Platelets: 151 K/uL (ref 150–400)
RBC: 4.77 MIL/uL (ref 3.87–5.11)
RBC: 4.72 MIL/uL (ref 3.87–5.11)
RDW: 16.4 % — ABNORMAL HIGH (ref 11.5–15.5)
RDW: 16.7 % — ABNORMAL HIGH (ref 11.5–15.5)
WBC: 14.4 K/uL — ABNORMAL HIGH (ref 4.0–10.5)
WBC: 14.6 K/uL — ABNORMAL HIGH (ref 4.0–10.5)
nRBC: 0 % (ref 0.0–0.2)
nRBC: 0 % (ref 0.0–0.2)

## 2024-06-03 LAB — HEPATIC FUNCTION PANEL
ALT: 23 U/L (ref 0–44)
AST: 37 U/L (ref 15–41)
Albumin: 2 g/dL — ABNORMAL LOW (ref 3.5–5.0)
Alkaline Phosphatase: 59 U/L (ref 38–126)
Bilirubin, Direct: 0.3 mg/dL — ABNORMAL HIGH (ref 0.0–0.2)
Indirect Bilirubin: 0.5 mg/dL (ref 0.3–0.9)
Total Bilirubin: 0.8 mg/dL (ref 0.0–1.2)
Total Protein: 5.1 g/dL — ABNORMAL LOW (ref 6.5–8.1)

## 2024-06-03 LAB — POCT I-STAT EG7
Acid-base deficit: 1 mmol/L (ref 0.0–2.0)
Acid-base deficit: 1 mmol/L (ref 0.0–2.0)
Bicarbonate: 25.6 mmol/L (ref 20.0–28.0)
Bicarbonate: 26.1 mmol/L (ref 20.0–28.0)
Calcium, Ion: 0.99 mmol/L — ABNORMAL LOW (ref 1.15–1.40)
Calcium, Ion: 1.02 mmol/L — ABNORMAL LOW (ref 1.15–1.40)
HCT: 47 % — ABNORMAL HIGH (ref 36.0–46.0)
HCT: 48 % — ABNORMAL HIGH (ref 36.0–46.0)
Hemoglobin: 16 g/dL — ABNORMAL HIGH (ref 12.0–15.0)
Hemoglobin: 16.3 g/dL — ABNORMAL HIGH (ref 12.0–15.0)
O2 Saturation: 59 %
O2 Saturation: 61 %
Potassium: 3.5 mmol/L (ref 3.5–5.1)
Potassium: 3.6 mmol/L (ref 3.5–5.1)
Sodium: 137 mmol/L (ref 135–145)
Sodium: 137 mmol/L (ref 135–145)
TCO2: 27 mmol/L (ref 22–32)
TCO2: 28 mmol/L (ref 22–32)
pCO2, Ven: 49.9 mmHg (ref 44–60)
pCO2, Ven: 50.2 mmHg (ref 44–60)
pH, Ven: 7.317 (ref 7.25–7.43)
pH, Ven: 7.324 (ref 7.25–7.43)
pO2, Ven: 34 mmHg (ref 32–45)
pO2, Ven: 34 mmHg (ref 32–45)

## 2024-06-03 LAB — POCT I-STAT 7, (LYTES, BLD GAS, ICA,H+H)
Acid-base deficit: 2 mmol/L (ref 0.0–2.0)
Bicarbonate: 24.2 mmol/L (ref 20.0–28.0)
Calcium, Ion: 1.01 mmol/L — ABNORMAL LOW (ref 1.15–1.40)
HCT: 45 % (ref 36.0–46.0)
Hemoglobin: 15.3 g/dL — ABNORMAL HIGH (ref 12.0–15.0)
O2 Saturation: 95 %
Patient temperature: 37.7
Potassium: 3.4 mmol/L — ABNORMAL LOW (ref 3.5–5.1)
Sodium: 133 mmol/L — ABNORMAL LOW (ref 135–145)
TCO2: 26 mmol/L (ref 22–32)
pCO2 arterial: 45.4 mmHg (ref 32–48)
pH, Arterial: 7.338 — ABNORMAL LOW (ref 7.35–7.45)
pO2, Arterial: 83 mmHg (ref 83–108)

## 2024-06-03 LAB — PROCALCITONIN: Procalcitonin: 0.55 ng/mL

## 2024-06-03 LAB — LACTIC ACID, PLASMA
Lactic Acid, Venous: 2.9 mmol/L (ref 0.5–1.9)
Lactic Acid, Venous: 2.3 mmol/L (ref 0.5–1.9)
Lactic Acid, Venous: 2.5 mmol/L (ref 0.5–1.9)

## 2024-06-03 LAB — MAGNESIUM: Magnesium: 2 mg/dL (ref 1.7–2.4)

## 2024-06-03 LAB — CG4 I-STAT (LACTIC ACID): Lactic Acid, Venous: 1.8 mmol/L (ref 0.5–1.9)

## 2024-06-03 LAB — GLUCOSE, CAPILLARY
Glucose-Capillary: 128 mg/dL — ABNORMAL HIGH (ref 70–99)
Glucose-Capillary: 123 mg/dL — ABNORMAL HIGH (ref 70–99)
Glucose-Capillary: 121 mg/dL — ABNORMAL HIGH (ref 70–99)

## 2024-06-03 SURGERY — RIGHT HEART CATH
Anesthesia: LOCAL

## 2024-06-03 MED ORDER — POTASSIUM CHLORIDE 20 MEQ PO PACK
60.0000 meq | PACK | Freq: Once | ORAL | Status: AC
  Administered 2024-06-03: 60 meq via ORAL

## 2024-06-03 MED ORDER — VANCOMYCIN HCL 1750 MG/350ML IV SOLN
1750.0000 mg | INTRAVENOUS | Status: DC
  Filled 2024-06-03: qty 350

## 2024-06-03 MED ORDER — FUROSEMIDE 10 MG/ML IJ SOLN
60.0000 mg | Freq: Once | INTRAMUSCULAR | Status: AC
  Administered 2024-06-03: 60 mg via INTRAVENOUS
  Filled 2024-06-03: qty 6

## 2024-06-03 MED ORDER — VANCOMYCIN HCL 10 G IV SOLR
2500.0000 mg | Freq: Once | INTRAVENOUS | Status: AC
  Administered 2024-06-03: 2500 mg via INTRAVENOUS
  Filled 2024-06-03: qty 2500

## 2024-06-03 MED ORDER — HEPARIN (PORCINE) 25000 UT/250ML-% IV SOLN
900.0000 [IU]/h | INTRAVENOUS | Status: DC
  Administered 2024-06-03: 1000 [IU]/h via INTRAVENOUS
  Administered 2024-06-05: 950 [IU]/h via INTRAVENOUS
  Filled 2024-06-03 (×2): qty 250

## 2024-06-03 MED ORDER — NOREPINEPHRINE 4 MG/250ML-% IV SOLN
0.0000 ug/min | INTRAVENOUS | Status: DC
Start: 2024-06-03 — End: 2024-06-06
  Administered 2024-06-03 – 2024-06-06 (×3): 2 ug/min via INTRAVENOUS
  Filled 2024-06-03: qty 250

## 2024-06-03 MED ORDER — SODIUM CHLORIDE 0.9 % IV SOLN
2.0000 g | INTRAVENOUS | Status: AC
  Administered 2024-06-03: 2 g via INTRAVENOUS
  Filled 2024-06-03: qty 12.5

## 2024-06-03 MED ORDER — SODIUM CHLORIDE 0.9 % IV SOLN: INTRAVENOUS | Status: AC | PRN

## 2024-06-03 MED ORDER — BETHANECHOL CHLORIDE 10 MG PO TABS
10.0000 mg | ORAL_TABLET | Freq: Three times a day (TID) | ORAL | Status: DC
Start: 2024-06-03 — End: 2024-06-06
  Administered 2024-06-03 – 2024-06-05 (×7): 10 mg via ORAL
  Filled 2024-06-03 (×8): qty 1

## 2024-06-03 MED ORDER — HEPARIN (PORCINE) 25000 UT/250ML-% IV SOLN
1000.0000 [IU]/h | INTRAVENOUS | Status: DC
  Administered 2024-06-03: 1000 [IU]/h via INTRAVENOUS
  Filled 2024-06-03: qty 250

## 2024-06-03 MED ORDER — LIDOCAINE HCL (PF) 1 % IJ SOLN
INTRAMUSCULAR | Status: DC | PRN
  Administered 2024-06-03: 5 mL

## 2024-06-03 MED ORDER — SODIUM CHLORIDE 0.9% FLUSH
3.0000 mL | Freq: Two times a day (BID) | INTRAVENOUS | Status: DC
  Administered 2024-06-03: 3 mL via INTRAVENOUS

## 2024-06-03 MED ORDER — HEPARIN (PORCINE) IN NACL 1000-0.9 UT/500ML-% IV SOLN
INTRAVENOUS | Status: DC | PRN
  Administered 2024-06-03: 500 mL

## 2024-06-03 MED ORDER — SODIUM CHLORIDE 0.9 % IV SOLN
2.0000 g | Freq: Two times a day (BID) | INTRAVENOUS | Status: DC
  Administered 2024-06-03 – 2024-06-05 (×5): 2 g via INTRAVENOUS
  Filled 2024-06-03 (×5): qty 12.5

## 2024-06-03 MED ORDER — SODIUM CHLORIDE 0.9% FLUSH: 3.0000 mL | INTRAVENOUS | Status: DC | PRN

## 2024-06-03 MED ORDER — SODIUM CHLORIDE 0.9 % IV SOLN: 250.0000 mL | INTRAVENOUS | Status: DC | PRN

## 2024-06-03 MED ORDER — SODIUM CHLORIDE 0.9% FLUSH
10.0000 mL | Freq: Two times a day (BID) | INTRAVENOUS | Status: DC
  Administered 2024-06-03: 10 mL
  Administered 2024-06-04: 30 mL
  Administered 2024-06-04 – 2024-06-05 (×2): 10 mL

## 2024-06-03 MED ORDER — POTASSIUM CHLORIDE 20 MEQ PO PACK
PACK | ORAL | Status: AC
Start: 2024-06-03 — End: 2024-06-03
  Filled 2024-06-03: qty 3

## 2024-06-03 MED ORDER — SODIUM CHLORIDE 0.9 % IV SOLN: INTRAVENOUS | Status: DC

## 2024-06-03 MED ORDER — SODIUM CHLORIDE 0.9% FLUSH: 10.0000 mL | INTRAVENOUS | Status: DC | PRN

## 2024-06-03 SURGICAL SUPPLY — 10 items
CATH BALLN WEDGE 5F 110CM (CATHETERS) IMPLANT
CATH SWAN GANZ 7F STRAIGHT (CATHETERS) IMPLANT
GLIDESHEATH SLENDER 7FR .021G (SHEATH) IMPLANT
GUIDEWIRE .025 260CM (WIRE) IMPLANT
PACK CARDIAC CATHETERIZATION (CUSTOM PROCEDURE TRAY) ×2 IMPLANT
SHEATH GLIDE SLENDER 4/5FR (SHEATH) IMPLANT
SHEATH PROBE COVER 6X72 (BAG) IMPLANT
STOPCOCK MORSE 400PSI 3WAY (MISCELLANEOUS) IMPLANT
TRANSDUCER W/STOPCOCK (MISCELLANEOUS) IMPLANT
TUBING ART PRESS 72 MALE/FEM (TUBING) IMPLANT

## 2024-06-03 NOTE — Progress Notes (Signed)
 Peripherally Inserted Central Catheter Placement  The IV Nurse has discussed with the patient and/or persons authorized to consent for the patient, the purpose of this procedure and the potential benefits and risks involved with this procedure.  The benefits include less needle sticks, lab draws from the catheter, and the patient may be discharged home with the catheter. Risks include, but not limited to, infection, bleeding, blood clot (thrombus formation), and puncture of an artery; nerve damage and irregular heartbeat and possibility to perform a PICC exchange if needed/ordered by physician.  Alternatives to this procedure were also discussed.  Bard Power PICC patient education guide, fact sheet on infection prevention and patient information card has been provided to patient /or left at bedside.    PICC Placement Documentation  PICC Triple Lumen 06/03/24 Right Brachial 37 cm 0 cm (Active)  Indication for Insertion or Continuance of Line Vasoactive infusions 06/03/24 1810  Exposed Catheter (cm) 0 cm 06/03/24 1810  Site Assessment Clean, Dry, Intact 06/03/24 1810  Lumen #1 Status Flushed;Saline locked;Blood return noted 06/03/24 1810  Lumen #2 Status Flushed;Saline locked;Blood return noted 06/03/24 1810  Lumen #3 Status Flushed;Saline locked;Blood return noted 06/03/24 1810  Dressing Type Transparent;Securing device 06/03/24 1810  Dressing Status Antimicrobial disc/dressing in place;Clean, Dry, Intact 06/03/24 1810  Line Care Connections checked and tightened 06/03/24 1810  Line Adjustment (NICU/IV Team Only) No 06/03/24 1810  Dressing Intervention New dressing;Adhesive placed at insertion site (IV team only) 06/03/24 1810  Dressing Change Due 06/10/24 06/03/24 1810   Telephone consent obtained and placed in chart.    Hobert Benders 06/03/2024, 6:13 PM

## 2024-06-03 NOTE — Progress Notes (Signed)
 PROGRESS NOTE  RAVINDER HOFLAND  FMW:[id number] DOB: Apr 01, 1941 DOA: 05/22/2024 PCP: [name], DO   Brief Narrative: Patient is a 83 year old female with history of diabetes type 2, HFrEF, VT status post ICD Medtr[employer] placement taking amiodarone , cardioembolic splenic/renal infarcts, atrial fibrillation status post ablation, peripheral artery disease, coronary arterydisease, OSA on CPAP, CKD stage III, hypothyroidism who presented with nausea, vomiting, weakness, diarrhea.  Found to be dehydrated on presentation.  GI consulted.  EP were also following due to concern for VT. Hospital course notable for persistent diarrhea, now resolved.  She  became hypotensive during this hospitalization, needed to held cardiac meds. Cardiology reconsulted. Plan for RV lead revision during this hospitalization.Lab work today showed elevated leucocytes,elevated lactate.Started on abx,cultures sent   .Assessment & Plan:  Principal Problem:   AKI (acute kidney injury) Active Problems:   CKD (chronic kidney disease) stage 3, GFR 30-59 ml/min (HCC)   COPD (chronic obstructive pulmonary disease) (HCC)   Paroxysmal atrial fibrillation (HCC)   Obstructive sleep apnea   Type 2 diabetes mellitus with hyperlipidemia (HCC)   Essential hypertension   Hypothyroidism   Diarrhea   Encounter for assessment for deep vein thrombosis (DVT)   Diabetic nephropathy (HCC)   Nausea and vomiting   Dehydration   Change in bowel habits   Nausea without vomiting  AKI on CKD stage IIIb: From volume depletion from vomiting, diarrhea.  Currently kidney function at baseline  Diarrheal illness: GI was  following.  Taking Mounjaro at home.  C. difficile /gI pathogen negative.  Currently on Imodium, colestipol.  Fecal calprotectin boderline of 61. There was  plan for possible colonoscopy./EGD but since diarrhea has improved, it was canceled.    GI recommended to discontinue Mounjaro permanently on discharge.  Continue   colestipol.  Had an episode of diarrhea again on 11/17.  GI reconsulted as per family request.  No plan for EGD or colonoscopy.  Diarrhea has subsided again  Orthostatic hypotension/Hypotension: Blood pressure was soft and she is orthostatic as per PT eval.  Started on IV fluids, given few fluid boluses.here is also technical difficulty to take her blood pressure so may not be reliable.  Currently she is asymptomatic.  Normal random serum cortisol.  Blood pressure stable this afternoon.Cardiology planning for right heart cath  Leucocytosis/elevated lactate: New problem.  Denies any abdomen pain or dysuria today.  Requiring 2 L of oxygen.  Checking chest x-ray to rule out pneumonia.  Abdomen is nontender.  Empirically started on vancomycin  and cefepime  for now.  Low threshold to discontinue antibiotics as soon as we get culture report  Urinary retention: Unclear etiology.  US  renal did not show any hydronephrosis.  Foley inserted on 11/15.  Given voiding trial on 11/17  V. tach: Has ICD.  Takes amiodarone , metoprolol , Mexitil .  EP was consulted, PPM adjusted.  EP was planning for placement of new RV lead on 06/05/2024 as an outpatient.  Since blood pressure was soft, discontinued metoprolol .  Cardiology reconsulted.  Plan for RV lead revision during this hospitalization,on Thursday  Persistent A-fib: Remains in A-fib rhythm with controlled rate.  Continue, amiodarone .Metoprolol  on hold  HFmrEF: Improved EF as per recent echo in June 2025 of 70 to 75%.  She was taking Lasix , Entresto , Inspra  at home, currently on hold  COPD: Currently not in exacerbation.  Continue bronchodilators as needed  Diabetes type 2: .  Mounjaro will be discontinued.  A1c of 6.2.   Takes Farxiga , Mounjaro ,Humalog  10 units twice daily  at home.  Currently on sliding scale  Hypothyroidism: Continue Synthyroid at 25 mcg.  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  Hepatitis panel negative.   Thrombocytopenia: Likely  associated with history of  cirrhosis or acute illness.  Case was discussed with hematology  UTI: UA showed large leukocytes, no nitrates.  Urine culture pending.  On ceftriaxone , completed 3 days course. No dysuria  Debility/deconditioning: PT recommended SNF on discharge.  Mostly nonambulatory.  Wheelchair dependent.  Lives with daughter  Morbid obesity: BMI of  40.3        Wound 05/23/24 0632 Pressure Injury Buttocks Left Stage 2 -  Partial thickness loss of dermis presenting as a shallow open injury with a red, pink wound bed without slough. (Active)     Wound 05/23/24 0556 Pressure Injury Ankle Left;Posterior Stage 2 -  Partial thickness loss of dermis presenting as a shallow open injury with a red, pink wound bed without slough. (Active)     Wound 06/01/24 1515 Pressure Injury Buttocks Right;Left Deep Tissue Pressure Injury - Purple or maroon localized area of discolored intact skin or blood-filled blister due to damage of underlying soft tissue from pressure and/or shear. (Active)    DVT prophylaxis:Place TED hose Start: 05/30/24 0953     Code Status: Limited: Do not attempt resuscitation (DNR) -DNR-LIMITED -Do Not Intubate/DNI   Family Communication: Discussed with daughter at bedside on 11/18  Patient status: Inpatient  Patient is from : Home  Anticipated discharge to: SNF  Estimated DC date:after full work up   Consultants: GI,cardiology  Procedures:None yet  Antimicrobials:  Anti-infectives (From admission, onward)    Start     Dose/Rate Route Frequency Ordered Stop   06/05/24 1600  vancomycin  (VANCOREADY) IVPB 1750 mg/350 mL        1,750 mg 175 mL/hr over 120 Minutes Intravenous Every 48 hours 06/03/24 1213     06/03/24 2200  ceFEPIme  (MAXIPIME ) 2 g in sodium chloride  0.9 % 100 mL IVPB        2 g 200 mL/hr over 30 Minutes Intravenous Every 12 hours 06/03/24 1213     06/03/24 1300  ceFEPIme  (MAXIPIME ) 2 g in sodium chloride  0.9 % 100 mL IVPB        2  g 200 mL/hr over 30 Minutes Intravenous STAT 06/03/24 1212 06/04/24 1300   06/03/24 1300  vancomycin  (VANCOCIN ) 2,500 mg in sodium chloride  0.9 % 500 mL IVPB        2,500 mg 262.5 mL/hr over 120 Minutes Intravenous  Once 06/03/24 1213     05/27/24 1745  cefTRIAXone  (ROCEPHIN ) 1 g in sodium chloride  0.9 % 100 mL IVPB        1 g 200 mL/hr over 30 Minutes Intravenous Every 24 hours 05/27/24 1657 05/29/24 1729       Subjective: Patient seen and examined at bedside today.  Lying on bed.  Weak but overall comfortable.  Alert and oriented.  Denies abdomen pain, nausea or vomiting.  No diarrhea.  Denies shortness of breath or cough.  On 2 L of oxygen per minute.  Afebrile during my evaluation.  Daughter was at bedside.  Blood pressure rechecked this afternoon and was 150 systolic  Objective: Vitals:   06/03/24 0512 06/03/24 0756 06/03/24 1155 06/03/24 1318  BP: (!) 93/57 118/71 (!) 79/63 (!) 155/66  Pulse: 82 (!) 113 76 85  Resp: 14 16 11  (!) 26  Temp:  (!) 97.4 F (36.3 C) 98 F (36.7 C)   TempSrc:  Oral Axillary   SpO2: 93%  90% 94%  Weight:      Height:        Intake/Output Summary (Last 24 hours) at 06/03/2024 1338 Last data filed at 06/02/2024 1430 Gross per 24 hour  Intake --  Output 125 ml  Net -125 ml   Filed Weights   05/22/24 2118  Weight: 113.4 kg    Examination:  General exam: Overall comfortable, not in distress.  Obese, weak, lying on bed, pleasant elderly female HEENT: PERRL Respiratory system:  no wheezes or crackles  Cardiovascular system: Irregular irregular rhythm Gastrointestinal system: Abdomen is nondistended, soft and nontender. Central nervous system: Alert and oriented Extremities: No edema, no clubbing ,no cyanosis Skin: No rashes, no ulcers,no icterus     Data Reviewed: I have personally reviewed following labs and imaging studies  CBC: Recent Labs  Lab 05/28/24 0334 05/29/24 0355 05/30/24 0412 05/31/24 0455 06/01/24 0556  06/02/24 0416 06/03/24 0554  WBC 10.8* 10.1 8.2 10.8* 7.9 7.9 14.4*  NEUTROABS 6.4 5.1 4.6 5.7 4.1  --   --   HGB 15.8* 15.0 14.9 15.5* 14.9 14.3 14.5  HCT 48.7* 46.1* 46.2* 48.8* 44.5 42.9 43.0  MCV 92.6 92.6 93.3 94.9 90.6 91.1 90.1  PLT 87* 103* 102* 121* 124* 123* 142*   Basic Metabolic Panel: Recent Labs  Lab 05/30/24 0412 05/31/24 0455 06/01/24 0556 06/02/24 0416 06/03/24 0554  NA 134* 133* 134* 133* 132*  K 3.7 4.1 3.7 3.7 3.6  CL 102 106 100 99 97*  CO2 19* 14* 21* 22 23  GLUCOSE 83 85 112* 106* 127*  BUN 18 16 15 18  27*  CREATININE 1.38* 1.04* 0.83 0.80 1.21*  CALCIUM  7.5* 7.4* 7.1* 6.9* 7.0*     Recent Results (from the past 240 hours)  MRSA Next Gen by PCR, Nasal     Status: Abnormal   Collection Time: 05/26/24 11:27 AM   Specimen: Nasal Mucosa; Nasal Swab  Result Value Ref Range Status   MRSA by PCR Next Gen DETECTED (A) NOT DETECTED Final    Comment: RESULT CALLED TO, READ BACK BY AND VERIFIED WITH: RN PRECIOUS S ON 05/26/24 @ 1440 BY DRT (NOTE) The GeneXpert MRSA Assay (FDA approved for NASAL specimens only), is one component of a comprehensive MRSA colonization surveillance program. It is not intended to diagnose MRSA infection nor to guide or monitor treatment for MRSA infections. Test performance is not FDA approved in patients less than 15 years old. Performed at [facility], [address], [place], [place] [address]   Calprotectin, Fecal     Status: None   Collection Time: 05/27/24  7:05 PM   Specimen: Stool  Result Value Ref Range Status   Calprotectin, Fecal 61 0 - 120 ug/g Final    Comment: (NOTE) Concentration     Interpretation   Follow-Up < 5 - 50 ug/g     Normal           None >50 -120 ug/g     Borderline       Re-evaluate in 4-6 weeks    >120 ug/g     Abnormal         Repeat as clinically                                   indicated Performed At: [facility] [address] [place], [place] [id number] Nagendra Sanjai  MD Ey:[id number]      Radiology Studies: No results found.    Scheduled Meds:  amiodarone   200 mg Oral Daily   Chlorhexidine  Gluconate Cloth  6 each Topical Daily   colestipol  2 g Oral BID   gabapentin   300 mg Oral BID   insulin  aspart  0-5 Units Subcutaneous QHS   insulin  aspart  0-9 Units Subcutaneous TID WC   levothyroxine   25 mcg Oral Daily   LORazepam   0.5 mg Oral QHS   metoCLOPramide (REGLAN) injection  10 mg Intravenous Q8H   mexiletine  300 mg Oral BID   midodrine  10 mg Oral TID WC   nystatin    Topical BID   pantoprazole (PROTONIX) IV  40 mg Intravenous Q24H   saccharomyces boulardii  250 mg Oral BID   Continuous Infusions:  sodium chloride  100 mL/hr at 06/03/24 1134   ceFEPime  (MAXIPIME ) IV 2 g (06/03/24 1332)   ceFEPime  (MAXIPIME ) IV     heparin  1,000 Units/hr (06/03/24 1048)   vancomycin      [START ON 06/05/2024] vancomycin         LOS: 11 days   Ivonne Mustache, MD [facility] P11/18/2025, 1:38 PM

## 2024-06-03 NOTE — Progress Notes (Signed)
 PHARMACY - ANTICOAGULATION CONSULT NOTE  Pharmacy Consult for heparin  infusion Indication: atrial fibrillation/hx of DVT  Allergies  Allergen Reactions   Neomycin-Bacitracin Zn-Polymyx Rash   Sulfa Antibiotics Diarrhea and Nausea And Vomiting   Ciprofloxacin  Hives, Itching and Rash    Pt doesn't remember having any reaction to cipro    Spironolactone  Rash    Patient Measurements: Height: 5' 6 (167.6 cm) Weight: 113.4 kg (250 lb) IBW/kg (Calculated) : 59.3 HEPARIN  DW (KG): 85.9  Vital Signs: Temp: 97.7 F (36.5 C) (11/18 1644) Temp Source: Oral (11/18 1644) BP: (P) 93/58 (11/18 1644) Pulse Rate: 84 (11/18 1644)  Labs: Recent Labs    06/01/24 0556 06/02/24 0416 06/03/24 0554 06/03/24 1611 06/03/24 1855  HGB 14.9 14.3 14.5 16.3*  16.0* 15.3*  HCT 44.5 42.9 43.0 48.0*  47.0* 45.0  PLT 124* 123* 142*  --   --   CREATININE 0.83 0.80 1.21*  --   --     Estimated Creatinine Clearance: 45.8 mL/min (A) (by C-G formula based on SCr of 1.21 mg/dL (H)).   Infusions:   sodium chloride  75 mL/hr at 06/03/24 1358   sodium chloride      ceFEPime  (MAXIPIME ) IV     heparin  1,000 Units/hr (06/03/24 1048)   [START ON 06/05/2024] vancomycin       Assessment: 83 yo F presenting for vomiting and diarrhea. Now undergoing RV lead revision. PMH significant for MASH, Afib, hx of DVT, thrombocytopenia, VT s/p ICD. On Eliquis  PTA for Afib and hx of DVT, has been receiving it inpatient as well. Last dose 06/02/24 at 2258. Pharmacy consulted for heparin  management.  S/p RHC today - resume to restart post cath. PICC line also placed after pt back on floor. Will monitor via aPTT and heparin  level until correlating with recent DOAC use.   Goal of Therapy:  Heparin  level 0.3-0.7 units/ml aPTT 66-102 seconds Monitor platelets by anticoagulation protocol: Yes   Plan:  Restart heparin  infusion at 1000 units/hr  Check 8 hour heparin  level/aPTT Monitor daily heparin  level and aPTT until  correlating Monitor daily CBC and signs/symptoms of bleeding  Thank you for allowing pharmacy to be a part of this patient's care.   Vito Ralph, PharmD, BCPS Please see amion for complete clinical pharmacist phone list 06/03/2024,7:21 PM

## 2024-06-03 NOTE — Progress Notes (Addendum)
 Progress Note  Patient Name: Heidi Stark Date of Encounter: 06/03/2024 [facility] Cardiologist: Ezra Shuck, MD   Interval Summary   Remains asymptomatic. Did not sleep well last night but could not explain why.   Vital Signs Vitals:   06/03/24 0130 06/03/24 0133 06/03/24 0420 06/03/24 0512  BP:   (!) 82/57 (!) 93/57  Pulse: 93 84 85 82  Resp: 13 15 13 14   Temp:      TempSrc:      SpO2: 90% 90% 92% 93%  Weight:      Height:        Intake/Output Summary (Last 24 hours) at 06/03/2024 0741 Last data filed at 06/02/2024 1430 Gross per 24 hour  Intake 1789.31 ml  Output 125 ml  Net 1664.31 ml      05/22/2024    9:18 PM 05/06/2024    5:09 PM 04/24/2024   11:46 AM  Last 3 Weights  Weight (lbs) 250 lb 250 lb --  Weight (kg) 113.399 kg 113.399 kg --      Telemetry/ECG  AF HR 85 PVCs - Personally Reviewed  Physical Exam  GEN: Chronically ill appearing in no acute distress laying in bed with [place] Cardiac: Irregularly, irregular, no murmurs, rubs, or gallops.  Respiratory: End expiratory wheezing MS: No edema, cold extremities and weak pulses  Assessment & Plan  SHELANDA DUVALL is a 83 y.o. female withno significant CAD on cardiac catheterization in 08/2020, chronic HFrEF with a EF as low as 30-35% in 2018 but has since normalized, paroxysmal atrial fibrillation/ flutter s/p atrial fibrillation ablation in 2018 with recurrence requiring multiple DCCVs (most recently in 10/2022 ) on Eliquis , VT initially while on Tikosyn  in 08/2020 but had recurrence after this was stopped s/p Medtronic ICD in 09/2020, PAD with a known occluded left external iliac artery, splenic and renal infarcts in 10/2013, hypertension, hyperlipidemia, type 2 diabetes, hypothyroidism, CKD stage III, obstructive sleep apnea on CPAP, and chronic low back pain  who was admitted for GI illness and electrolyte disturbances on 11/6.  She had missed oral medications due to GI illness and subsequently had  episodes of MMVT.    HFimEF Has been hypotensive this admission, on midodrine. Entresto  on hold since 11/14 2/2 hypotension Last dose of diuretic 11/11  No weight recorded since admission, RN was unable to obtain bed weight yesterday, will request weight today as patient has now moved floors.  Net IO Since Admission: 3,655.33 mL [06/03/24 0742]  Does not appear volume up, though body habitus does limit assessment. With extremities cool to touch and Cr. 0.8 -> 1.21. LFTs and lactic ordered. Patient unlikely to be a candidate for advanced therapies.  Echo pending  GDMT on hold 2/2 hypotension and ongoing GI illness Will hold on diuresis with hypotension   Hypotension  BP: 93/57 Orthostatic vitals on 11/14 (+). Received IV fluids.  Continue midodrine 10 mg TID, started 3 days ago    Elevated RV impedance Monomorphic VT [1 minute episode on telemetry, asymptomatic] EP following, planned RV lead replacement 11/20 with Dr. Inocencio Continue on mexiletine 300 mg Continue amiodarone  200 mg  BB on hold 2/2 hypotension   Persistent Atrial fibrillation  EP following.  Currently in rate controlled AF   BB on hold 2/2 hypotension Continue amiodarone  200 mg  Will transition eliquis  to IV heparin  today    Keep K >4 and Mag >2   PAD Minimal CAD Defer antiplatelet for chronic anticoagulation    History of  cirrhosis Per primary, though recommend close LFT follow-up as she is on amiodarone .   Per primary GI illness  Leukocytosis AKI on CKD  Urinary retention with UTI COPD T2DM Hypothyroidism  Thrombocytopenia OSA on CPAP Deconditioning    For questions or updates, please contact [facility] Please consult [email or web address] for contact info under       Signed, Leontine LOISE Salen, PA-C     Patient seen and examined, note reviewed with the signed Advanced Practice Provider. I personally reviewed laboratory data, imaging studies and relevant notes. I independently  examined the patient and formulated the important aspects of the plan. I have personally discussed the plan with the patient and/or family. Comments or changes to the note/plan are indicated below.  Patient profile: This is an 83 year old female with past medical history of CAD, HFrEF and improved ejection fraction, paroxysmal atrial fibrillation/flutter s/p atrial fibrillation ablation in 2018 with recurrence requiring multiple cardioversions and on Eliquis , VT initially while on Tikosyn  in 2022 but had recurrence after this was stopped s/p Medtronic ICD in 2022, PAD with occluded left external iliac artery, splenic and renal infarcts in 2015, hypertension, hyperlipidemia, type 2 diabetes, CKD 3, OSA on CPAP who was admitted with GI illness and electrolyte disturbances on 11/6 leading her to miss multiple oral medications due to poor p.o. intake.   My Exam:  Physical Exam Physical Exam Vitals and nursing note reviewed.  Constitutional:      Appearance: She is ill-appearing.  Cardiovascular:     Rate and Rhythm: Normal rate. Rhythm irregular.  Pulmonary:     Effort: Pulmonary effort is normal.  Musculoskeletal:     Right lower leg: No edema.     Left lower leg: No edema.     Comments: Cool extremities  Neurological:     Mental Status: She is alert.      Assessment & Plan:   Heart failure improved ejection fraction with BiV ICD- SGLT2i discontinued due to soft Bps. Echo pending. Cool extremities on exam with hypotension. Hypotension-currently on midodrine.  Echocardiogram pending.  With leukocytosis there is concern for an infectious etiology. Will start an infectious workup Monomorphic VT-EP on board.  On mexiletine and amiodarone  Elevated RV impedance scheduled for elective RV lead replacement prior to discharge Persistent atrial fibrillation, currently rate controlled EP following.  Currently on Eliquis  PAD CAD on Eliquis  Possible cirrhosis GI illness-GI planning on outpatient  colonoscopy  Time coordinating patient care: 37 minutes  Signed, Emeline Calender, DO [place]  [facility]  06/03/2024 9:53 AM    Addendum 1:31 PM Patient has remained hypotensive requiring midodrine 10 mg 3 times daily.  She is also bit lethargic on my exam and daughter at bedside is concerned about her mental status as well.  She has had lactic acidosis, AKI today and leukocytosis today as well.  Also with cool extremities.  On personal read of the echocardiogram, the RV appears to be dilated with reduced function.   Will pursue a right heart catheterization for better volume and functional assessment.  I discussed risks and benefits with the patient and daughter at bedside who agreed to proceed.  Informed Consent   Shared Decision Making/Informed Consent The risks, including but not limited to, [bleeding or vascular complications (1 in 500), pneumothorax (1 in 1600), arrhythmia (1 in 1000) and death (1 in 5000)], benefits (diagnostic support and/or management of heart failure, pulmonary hypertension) and alternatives of a right heart catheterization were discussed in detail with Ms. Johal and  she is willing to proceed.

## 2024-06-03 NOTE — Progress Notes (Signed)
 Peripherally Inserted Central Catheter Placement  The IV Nurse has discussed with the patient and/or persons authorized to consent for the patient, the purpose of this procedure and the potential benefits and risks involved with this procedure.  The benefits include less needle sticks, lab draws from the catheter, and the patient may be discharged home with the catheter. Risks include, but not limited to, infection, bleeding, blood clot (thrombus formation), and puncture of an artery; nerve damage and irregular heartbeat and possibility to perform a PICC exchange if needed/ordered by physician.  Alternatives to this procedure were also discussed.  Bard Power PICC patient education guide, fact sheet on infection prevention and patient information card has been provided to patient /or left at bedside. PICC exchanged by Hobert Benders RN   PICC Placement Documentation  PICC Triple Lumen 06/03/24 Right Brachial 45 cm 0 cm (Active)  Indication for Insertion or Continuance of Line Vasoactive infusions 06/03/24 2106  Exposed Catheter (cm) 0 cm 06/03/24 2106  Site Assessment Clean, Dry, Intact 06/03/24 2106  Lumen #1 Status Blood return noted;Flushed;Saline locked 06/03/24 2106  Lumen #2 Status Blood return noted;Flushed;Saline locked 06/03/24 2106  Lumen #3 Status Blood return noted;Flushed;Saline locked 06/03/24 2106  Dressing Type Transparent;Securing device 06/03/24 2106  Dressing Status Antimicrobial disc/dressing in place 06/03/24 2106  Line Care Connections checked and tightened 06/03/24 2106  Line Adjustment (NICU/IV Team Only) No 06/03/24 2106  Dressing Intervention New dressing;Adhesive placed at insertion site (IV team only) 06/03/24 2106  Dressing Change Due 06/10/24 06/03/24 2106       [name] 06/03/2024, 9:08 PM

## 2024-06-03 NOTE — Procedures (Signed)
 Arterial Catheter Insertion Procedure Note  Heidi Stark  [id number]  Aug 20, 1940  Date:06/03/24  Time:5:49 PM    Provider Performing: Jeralyn FORBES Banner    Procedure: Insertion of Arterial Line (63379) with US  guidance (23062)   Indication(s) Blood pressure monitoring and/or need for frequent ABGs  Consent Risks of the procedure as well as the alternatives and risks of each were explained to the patient and/or caregiver.  Consent for the procedure was obtained and is signed in the bedside chart  Anesthesia None   Time Out Verified patient identification, verified procedure, site/side was marked, verified correct patient position, special equipment/implants available, medications/allergies/relevant history reviewed, required imaging and test results available.   Sterile Technique Maximal sterile technique including full sterile barrier drape, hand hygiene, sterile gown, sterile gloves, mask, hair covering, sterile ultrasound probe cover (if used).   Procedure Description Area of catheter insertion was cleaned with chlorhexidine  and draped in sterile fashion. With real-time ultrasound guidance an arterial catheter was placed into the left axillary artery.  Appropriate arterial tracings confirmed on monitor.     Complications/Tolerance None; patient tolerated the procedure well.   EBL Minimal   Specimen(s) None

## 2024-06-03 NOTE — Progress Notes (Signed)
 Pharmacy Antibiotic Note  Heidi Stark is a 83 y.o. female admitted on 05/22/2024 with sepsis.  Pharmacy has been consulted for vancomycin  and cefepime  dosing. Cr up today, lactic elevated, BP soft.  Plan: Cefepime  2g IV q12h Vancomycin  2500mg  IV x1 then 1750mg  IV q48h Follow Cr, BCx, LOT Vancomycin  levels as needed  Height: 5' 6 (167.6 cm) Weight: 113.4 kg (250 lb) IBW/kg (Calculated) : 59.3  Temp (24hrs), Avg:98 F (36.7 C), Min:97.4 F (36.3 C), Max:98.8 F (37.1 C)  Recent Labs  Lab 05/30/24 0412 05/31/24 0455 06/01/24 0556 06/02/24 0416 06/03/24 0554 06/03/24 1018  WBC 8.2 10.8* 7.9 7.9 14.4*  --   CREATININE 1.38* 1.04* 0.83 0.80 1.21*  --   LATICACIDVEN  --   --   --   --   --  2.9*    Estimated Creatinine Clearance: 45.8 mL/min (A) (by C-G formula based on SCr of 1.21 mg/dL (H)).    Allergies  Allergen Reactions   Neomycin-Bacitracin Zn-Polymyx Rash   Sulfa Antibiotics Diarrhea and Nausea And Vomiting   Ciprofloxacin  Hives, Itching and Rash    Pt doesn't remember having any reaction to cipro    Spironolactone  Rash    Antimicrobials this admission: Ceftriaxone  11/11 >> /11/13 Cefepime  11/18 >>  Vancomycin  11/18 >>  Dose adjustments this admission: none  Microbiology results: Pending  Ozell Jamaica, PharmD, BCPS, [facility] Clinical Pharmacist [phone] Please check AMION for all [facility] Pharmacy numbers 06/03/2024

## 2024-06-03 NOTE — Interval H&P Note (Signed)
 History and Physical Interval Note:  06/03/2024 3:22 PM  Heidi Stark  has presented today for surgery, with the diagnosis of heart failure.  The various methods of treatment have been discussed with the patient and family. After consideration of risks, benefits and other options for treatment, the patient has consented to  Procedure(s): RIGHT HEART CATH (N/A) as a surgical intervention.  The patient's history has been reviewed, patient examined, no change in status, stable for surgery.  I have reviewed the patient's chart and labs.  Questions were answered to the patient's satisfaction.     Aggie Douse [employer]

## 2024-06-03 NOTE — Plan of Care (Signed)
  Problem: Education: Goal: Ability to describe self-care measures that may prevent or decrease complications (Diabetes Survival Skills Education) will improve Outcome: Not Progressing Goal: Individualized Educational Video(s) Outcome: Not Progressing   Problem: Coping: Goal: Ability to adjust to condition or change in health will improve Outcome: Not Progressing   Problem: Fluid Volume: Goal: Ability to maintain a balanced intake and output will improve Outcome: Not Progressing   Problem: Health Behavior/Discharge Planning: Goal: Ability to identify and utilize available resources and services will improve Outcome: Not Progressing Goal: Ability to manage health-related needs will improve Outcome: Not Progressing   Problem: Nutritional: Goal: Maintenance of adequate nutrition will improve Outcome: Not Progressing Goal: Progress toward achieving an optimal weight will improve Outcome: Not Progressing

## 2024-06-03 NOTE — Consult Note (Addendum)
 Advanced Heart Failure Team Consult Note   Primary Physician: [name], DO Cardiologist:  Ezra Shuck, MD  Reason for Consultation: Shock   HPI:    Heidi Stark is seen today for evaluation of shock at the request of Dr. Jillian, Internal Medicine.   DOTTI BUSEY is a 83 y.o. female with PMH of HFpEF, atrial fibrillation and VT s/p ICD now complicated by RV lead malfunction, CAD, HTN, Type 2DM, OSA, CKD IIIb, COPD and hypothyroidism initially admitted for GI illness and electrolyte disturbances.  She had missed oral medications due to GI illness and subsequently had episodes of MMVT.    On admission, HF GDMT and diuretics also held d/t low BP and low volume status in setting of acute GI issues. Echo done today showed hyperdynamic EF 70-75%, RV mildly reduced.   EP has also been following for RV lead malfunction/ VT and planning for RV lead revision prior to d/c.   Earlier today, pt became acutely worse w/ development of shock w/ hypotension, elevated LA of 2.9 and leukocytosis, 14K. Bcx collected and started on broad spectrum abx.   AHF team asked to assist w/ performance of RHC to r/o cardiogenic shock component.     Home Medications Prior to Admission medications   Medication Sig Start Date End Date Taking? Authorizing Provider  amiodarone  (PACERONE ) 200 MG tablet TAKE 1 TABLET BY MOUTH EVERY DAY 08/23/23  Yes Shuck Ezra RAMAN, MD  apixaban  (ELIQUIS ) 5 MG TABS tablet TAKE 1 TABLET BY MOUTH TWICE A DAY 03/06/24  Yes Cherrell Maybee S, MD  clotrimazole (LOTRIMIN) 1 % cream Apply to affected area 2 times daily 05/21/24  Yes Beverley Doffing A, PA-C  dapagliflozin  propanediol (FARXIGA ) 10 MG TABS tablet TAKE 1 TABLET BY MOUTH EVERY DAY BEFORE BREAKFAST 04/23/24  Yes Shuck Ezra RAMAN, MD  ENTRESTO  24-26 MG TAKE 1 TABLET BY MOUTH TWICE A DAY 04/01/24  Yes Shuck Ezra RAMAN, MD  eplerenone  (INSPRA ) 25 MG tablet Take 1 tablet (25 mg total) by mouth daily. 03/20/24  Yes Shuck Ezra RAMAN, MD  famotidine  (PEPCID ) 20 MG tablet Take 1 tablet (20 mg total) by mouth daily. 03/10/24  Yes Antonio Cyndee, Yvonne R, DO  fenofibrate  (TRICOR ) 145 MG tablet TAKE 1 TABLET BY MOUTH EVERY DAY 01/22/23  Yes Shuck Ezra RAMAN, MD  furosemide  (LASIX ) 40 MG tablet Take 80 mg by mouth 2 (two) times daily. 11/15/23  Yes [provider]  gabapentin  (NEURONTIN ) 300 MG capsule Take 300 mg by mouth 2 (two) times daily as needed (pain). 07/31/22  Yes [provider]  HUMALOG  MIX 75/25 KWIKPEN (75-25) 100 UNIT/ML KwikPen Take 10 units with morning meal and 10 units with evening meal Patient taking differently: Inject 0-30 Units into the skin daily. If BS is  100 or < = No Insulin  100 - 140 = 10 Units Over 140 = 30 Units in the Morning and 20 Units at Bedtime 12/27/23  Yes Rojelio Nest, DO  levothyroxine  (SYNTHROID ) 25 MCG tablet Take 25 mcg by mouth daily. 04/06/24  Yes [provider]  LIVALO  4 MG TABS TAKE 1 TABLET BY MOUTH EVERY DAY 03/06/24  Yes Shuck Ezra RAMAN, MD  LORazepam  (ATIVAN ) 0.5 MG tablet Take 1 tablet (0.5 mg total) by mouth at bedtime. 03/10/24  Yes Antonio Cyndee Jamee R, DO  metoprolol  succinate (TOPROL -XL) 25 MG 24 hr tablet TAKE 1 TABLET BY MOUTH EVERYDAY AT BEDTIME Patient taking differently: Take 25 mg by mouth  daily. 04/23/23  Yes Hayes Beckey CROME, NP  mexiletine (MEXITIL ) 150 MG capsule TAKE 2 CAPSULES (300 MG TOTAL) BY MOUTH 2 (TWO) TIMES DAILY. 05/10/23  Yes Camnitz, Will Gladis, MD  MOUNJARO 2.5 MG/0.5ML Pen Inject 2.5 mg into the skin once a week. Thursdays   Yes [provider]  Multiple Vitamin (MULTIVITAMIN WITH MINERALS) TABS tablet Take 1 tablet by mouth daily.   Yes [provider]  ondansetron  (ZOFRAN -ODT) 4 MG disintegrating tablet Take 1 tablet (4 mg total) by mouth every 6 (six) hours as needed for nausea or vomiting. 05/21/24  Yes Prosperi, Christian H, PA-C  potassium chloride  (MICRO-K ) 10 MEQ CR capsule TAKE 1 CAPSULE BY MOUTH 2  TIMES DAILY. 05/12/24  Yes Bensimhon, Toribio SAUNDERS, MD  sertraline  (ZOLOFT ) 25 MG tablet Take 1 tablet (25 mg total) by mouth daily. 03/10/24  Yes Antonio Cyndee Rockers R, DO  VASCEPA  1 g capsule TAKE 2 CAPSULES BY MOUTH TWICE A DAY 10/10/23  Yes Rolan Ezra RAMAN, MD  acetaminophen  (TYLENOL ) 500 MG tablet Take 500 mg by mouth every 6 (six) hours as needed for moderate pain (pain score 4-6) or headache.    [provider]  BD PEN NEEDLE NANO 2ND GEN 32G X 4 MM MISC See admin instructions. 09/24/21   [provider]  ondansetron  (ZOFRAN ) 8 MG tablet Take 1 tablet (8 mg total) by mouth every 8 (eight) hours as needed for nausea or vomiting. Patient not taking: Reported on 05/23/2024 05/02/24   Antonio Cyndee, Rockers SAUNDERS, DO  ONETOUCH VERIO test strip 1 each 3 (three) times daily. 10/09/21   [provider]    Past Medical History: Past Medical History:  Diagnosis Date   Arthritis    hands, wrists (11/07/2016)   CHF (congestive heart failure) (HCC)    Chronic lower back pain    Constipation    DVT (deep venous thrombosis) (HCC)    History of blood transfusion 1990   related to OR   History of kidney stones    Hyperlipidemia    Hypertension    Joint pain    Lower extremity edema    OSA on CPAP    Persistent atrial fibrillation (HCC)    Pneumonia    several times (11/07/2016)   Type II diabetes mellitus (HCC)     Past Surgical History: Past Surgical History:  Procedure Laterality Date   ATRIAL FIBRILLATION ABLATION N/A 11/07/2016   Procedure: Atrial Fibrillation Ablation;  Surgeon: Will Gladis Norton, MD;  Location: [facility] INVASIVE CV LAB;  Service: Cardiovascular;  Laterality: N/A;   BACK SURGERY     CARDIAC CATHETERIZATION     CARDIOVERSION N/A 08/11/2016   Procedure: CARDIOVERSION;  Surgeon: Ezra RAMAN Rolan, MD;  Location: [facility] ENDOSCOPY;  Service: Cardiovascular;  Laterality: N/A;   CARDIOVERSION N/A 12/03/2017   Procedure: CARDIOVERSION;  Surgeon: Rolan Ezra RAMAN,  MD;  Location: [facility] ENDOSCOPY;  Service: Cardiovascular;  Laterality: N/A;   CARDIOVERSION N/A 05/02/2018   Procedure: CARDIOVERSION;  Surgeon: Mona Vinie BROCKS, MD;  Location: [facility] ENDOSCOPY;  Service: Cardiovascular;  Laterality: N/A;   CARDIOVERSION N/A 06/23/2020   Procedure: CARDIOVERSION;  Surgeon: Hobart Powell BRAVO, MD;  Location: [facility] ENDOSCOPY;  Service: Cardiovascular;  Laterality: N/A;   CARDIOVERSION N/A 10/11/2020   Procedure: CARDIOVERSION;  Surgeon: Rolan Ezra RAMAN, MD;  Location: [facility] ENDOSCOPY;  Service: Cardiovascular;  Laterality: N/A;   CARPAL TUNNEL RELEASE     CATARACT EXTRACTION W/ INTRAOCULAR LENS  IMPLANT, BILATERAL Bilateral    COLON  SURGERY  1990   vein graft and colon repair after nicked artery with back surgert   COLONOSCOPY     CORONARY ANGIOGRAPHY N/A 09/08/2020   Procedure: CORONARY ANGIOGRAPHY;  Surgeon: Rolan Ezra RAMAN, MD;  Location: [facility] INVASIVE CV LAB;  Service: Cardiovascular;  Laterality: N/A;   CYSTECTOMY     between bladder and kidneys   GANGLION CYST EXCISION Left    ICD IMPLANT N/A 09/15/2020   Procedure: ICD IMPLANT;  Surgeon: Inocencio Soyla Lunger, MD;  Location: [facility] INVASIVE CV LAB;  Service: Cardiovascular;  Laterality: N/A;   KNEE CARTILAGE SURGERY Right 1960s   LUMBAR DISC SURGERY  1990   REPAIR ILIAC ARTERY  1990   vein graft and colon repair after nicked artery with back surgert   TEE WITHOUT CARDIOVERSION N/A 10/31/2013   Procedure: TRANSESOPHAGEAL ECHOCARDIOGRAM (TEE);  Surgeon: Ezra RAMAN Rolan, MD;  Location: [facility] ENDOSCOPY;  Service: Cardiovascular;  Laterality: N/A;   TUBAL LIGATION      Family History: Family History  Problem Relation Age of Onset   Diabetes Mother    Hypertension Mother    Cancer Mother    Obesity Mother    Diabetes Father    Obesity Father    Diabetes Other    Melanoma Other    Factor V Leiden deficiency Daughter     Social History: Social History   Socioeconomic History   Marital status: Married     Spouse name: Charles   Number of children: 2   Years of education: Not on file   Highest education level: Not on file  Occupational History   Occupation: Retired  Tobacco Use   Smoking status: Former    Current packs/day: 0.00    Average packs/day: 1 pack/day for 50.0 years (50.0 ttl pk-yrs)    Types: Cigarettes    Start date: 08/17/1970    Quit date: 08/17/2020    Years since quitting: 3.7   Smokeless tobacco: Never   Tobacco comments:    Quit 2022. Tay  Vaping Use   Vaping status: Never Used  Substance and Sexual Activity   Alcohol use: No    Alcohol/week: 0.0 standard drinks of alcohol   Drug use: No   Sexual activity: Not on file  Other Topics Concern   Not on file  Social History Narrative   Married x 67 years.    1 son and 1 daughter.   1 grandson.   Social Drivers of Health   Financial Resource Strain: Medium Risk (10/13/2021)   Overall Financial Resource Strain (CARDIA)    Difficulty of Paying Living Expenses: Somewhat hard  Food Insecurity: No Food Insecurity (05/23/2024)   Hunger Vital Sign    Worried About Running Out of Food in the Last Year: Never true    Ran Out of Food in the Last Year: Never true  Transportation Needs: No Transportation Needs (05/23/2024)   PRAPARE - Administrator, Civil Service (Medical): No    Lack of Transportation (Non-Medical): No  Physical Activity: Insufficiently Active (10/13/2021)   Exercise Vital Sign    Days of Exercise per Week: 2 days    Minutes of Exercise per Session: 20 min  Stress: No Stress Concern Present (10/13/2021)   [employer] of Occupational Health - Occupational Stress Questionnaire    Feeling of Stress : Not at all  Social Connections: Moderately Integrated (05/23/2024)   Social Connection and Isolation Panel    Frequency of Communication with Friends and Family: More than three  times a week    Frequency of Social Gatherings with Friends and Family: More than three times a week    Attends  Religious Services: 1 to 4 times per year    Active Member of Clubs or Organizations: No    Attends Banker Meetings: 1 to 4 times per year    Marital Status: Widowed    Allergies:  Allergies  Allergen Reactions   Neomycin-Bacitracin Zn-Polymyx Rash   Sulfa Antibiotics Diarrhea and Nausea And Vomiting   Ciprofloxacin  Hives, Itching and Rash    Pt doesn't remember having any reaction to cipro    Spironolactone  Rash    Objective:    Vital Signs:   Temp:  [97.4 F (36.3 C)-98.8 F (37.1 C)] 98 F (36.7 C) (11/18 1155) Pulse Rate:  [73-113] 85 (11/18 1318) Resp:  [11-26] 26 (11/18 1318) BP: (79-155)/(53-96) 155/66 (11/18 1318) SpO2:  [90 %-94 %] 90 % (11/18 1531) FiO2 (%):  [32 %] 32 % (11/17 2337) Last BM Date : 06/02/24  Weight change: Filed Weights   05/22/24 2118  Weight: 113.4 kg    Intake/Output:  No intake or output data in the 24 hours ending 06/03/24 1543    Physical Exam   GENERAL: ill appearing, elderly female NAD Lungs- diminished at bases  CARDIAC:  JVP not well visualized          Irregularly irregular rhythm and rate, no murmur.  No edema  ABDOMEN: Soft, non-tender, non-distended.  EXTREMITIES: cool extremities  NEUROLOGIC: altered/ confused    Telemetry   Atrial fibrillation, 80s, personally reviewed   EKG    Afib 83 bpm w/ RBBB   Labs   Basic Metabolic Panel: Recent Labs  Lab 05/30/24 0412 05/31/24 0455 06/01/24 0556 06/02/24 0416 06/03/24 0554  NA 134* 133* 134* 133* 132*  K 3.7 4.1 3.7 3.7 3.6  CL 102 106 100 99 97*  CO2 19* 14* 21* 22 23  GLUCOSE 83 85 112* 106* 127*  BUN 18 16 15 18  27*  CREATININE 1.38* 1.04* 0.83 0.80 1.21*  CALCIUM  7.5* 7.4* 7.1* 6.9* 7.0*    Liver Function Tests: Recent Labs  Lab 06/03/24 0810  AST 37  ALT 23  ALKPHOS 59  BILITOT 0.8  PROT 5.1*  ALBUMIN 2.0*   No results for input(s): LIPASE, AMYLASE in the last 168 hours. No results for input(s): AMMONIA in the last 168  hours.  CBC: Recent Labs  Lab 05/28/24 0334 05/29/24 0355 05/30/24 0412 05/31/24 0455 06/01/24 0556 06/02/24 0416 06/03/24 0554  WBC 10.8* 10.1 8.2 10.8* 7.9 7.9 14.4*  NEUTROABS 6.4 5.1 4.6 5.7 4.1  --   --   HGB 15.8* 15.0 14.9 15.5* 14.9 14.3 14.5  HCT 48.7* 46.1* 46.2* 48.8* 44.5 42.9 43.0  MCV 92.6 92.6 93.3 94.9 90.6 91.1 90.1  PLT 87* 103* 102* 121* 124* 123* 142*    Cardiac Enzymes: No results for input(s): CKTOTAL, CKMB, CKMBINDEX, TROPONINI in the last 168 hours.  BNP: BNP (last 3 results) Recent Labs    10/11/23 1548 02/29/24 1747  BNP 166.6* 240.0*    ProBNP (last 3 results) No results for input(s): PROBNP in the last 8760 hours.   CBG: Recent Labs  Lab 06/02/24 1109 06/02/24 1800 06/02/24 2249 06/03/24 0803 06/03/24 1154  GLUCAP 165* 152* 136* 128* 123*    Coagulation Studies: No results for input(s): LABPROT, INR in the last 72 hours.   Imaging   DG CHEST PORT 1 VIEW Result Date: 06/03/2024  CLINICAL DATA:  Acute respiratory failure. EXAM: PORTABLE CHEST 1 VIEW COMPARISON:  05/26/2024. FINDINGS: Stable cardiomegaly. Unchanged left-sided AICD. Aortic atherosclerosis. Increased asymmetric interstitial and patchy airspace opacities throughout the right lung. No sizable pleural effusion or pneumothorax. No acute osseous abnormality. IMPRESSION: 1. Increased asymmetric interstitial and patchy airspace opacities throughout the right lung could reflect asymmetric pulmonary edema or an infectious/inflammatory etiology. 2. Stable cardiomegaly. Electronically Signed   By: Harrietta Sherry M.D.   On: 06/03/2024 14:28   ECHOCARDIOGRAM COMPLETE Result Date: 06/03/2024    ECHOCARDIOGRAM REPORT   Patient Name:   BASMA BUCHNER Date of Exam: 06/03/2024 Medical Rec #:  [id number]      Height:       66.0 in Accession #:    [id number]     Weight:       250.0 lb Date of Birth:  [birth year]      BSA:          2.199 m Patient Age:    83 years       BP:            118/71 mmHg Patient Gender: F              HR:           121 bpm. Exam Location:  Inpatient Procedure: 2D Echo, Cardiac Doppler and Color Doppler (Both Spectral and Color            Flow Doppler were utilized during procedure). Indications:    Congestive Heart Failure I50.9  History:        Patient has prior history of Echocardiogram examinations, most                 recent 12/18/2023. CHF, Arrythmias:Atrial Fibrillation; Risk                 Factors:Hypertension, Diabetes and Sleep Apnea.  Sonographer:    Jayson Gaskins Referring Phys: [id number] LOGAN N LOCKWOOD IMPRESSIONS  1. Left ventricular ejection fraction, by estimation, is 70 to 75%. The left ventricle has hyperdynamic function. There is mild left ventricular hypertrophy.  2. Right ventricular systolic function is mildly reduced. The right ventricular size is mildly enlarged.  3. Left atrial size was moderately dilated.  4. Right atrial size was mildly dilated.  5. Trivial mitral valve regurgitation. Moderate mitral annular calcification.  6. The aortic valve has an indeterminant number of cusps. Aortic valve regurgitation is not visualized. Comparison(s): The left ventricular function is unchanged. FINDINGS  Left Ventricle: Left ventricular ejection fraction, by estimation, is 70 to 75%. The left ventricle has hyperdynamic function. The left ventricular internal cavity size was normal in size. There is mild left ventricular hypertrophy. Right Ventricle: The right ventricular size is mildly enlarged. Right vetricular wall thickness was not assessed. Right ventricular systolic function is mildly reduced. Left Atrium: Left atrial size was moderately dilated. Right Atrium: Right atrial size was mildly dilated. Pericardium: There is no evidence of pericardial effusion. Mitral Valve: There is mild thickening of the mitral valve leaflet(s). Moderate mitral annular calcification. Trivial mitral valve regurgitation. MV peak gradient, 14.4 mmHg. The mean mitral  valve gradient is 4.5 mmHg. Tricuspid Valve: The tricuspid valve is normal in structure. Tricuspid valve regurgitation is mild. Aortic Valve: The aortic valve has an indeterminant number of cusps. Aortic valve regurgitation is not visualized. Aortic valve mean gradient measures 5.0 mmHg. Aortic valve peak gradient measures 11.0 mmHg. Aortic valve area, by VTI measures 2.22 cm. Pulmonic Valve: The  pulmonic valve was not well visualized. Aorta: The aortic root and ascending aorta are structurally normal, with no evidence of dilitation. IAS/Shunts: No atrial level shunt detected by color flow Doppler. Additional Comments: A device lead is visualized.  LEFT VENTRICLE PLAX 2D LVIDd:         4.30 cm   Diastology LVIDs:         3.30 cm   LV e' medial:    10.00 cm/s LV PW:         1.20 cm   LV E/e' medial:  11.3 LV IVS:        1.20 cm   LV e' lateral:   14.40 cm/s LVOT diam:     2.00 cm   LV E/e' lateral: 7.8 LV SV:         46 LV SV Index:   21 LVOT Area:     3.14 cm  LEFT ATRIUM           Index        RIGHT ATRIUM           Index LA Vol (A2C): 40.9 ml 18.60 ml/m  RA Area:     23.50 cm LA Vol (A4C): 98.3 ml 44.71 ml/m  RA Volume:   75.00 ml  34.11 ml/m  AORTIC VALVE AV Area (Vmax):    2.06 cm AV Area (Vmean):   2.41 cm AV Area (VTI):     2.22 cm AV Vmax:           166.00 cm/s AV Vmean:          103.000 cm/s AV VTI:            0.207 m AV Peak Grad:      11.0 mmHg AV Mean Grad:      5.0 mmHg LVOT Vmax:         109.00 cm/s LVOT Vmean:        79.000 cm/s LVOT VTI:          0.146 m LVOT/AV VTI ratio: 0.71  AORTA Ao Root diam: 2.80 cm MITRAL VALVE MV Area (PHT): 4.06 cm     SHUNTS MV Area VTI:   1.31 cm     Systemic VTI:  0.15 m MV Peak grad:  14.4 mmHg    Systemic Diam: 2.00 cm MV Mean grad:  4.5 mmHg MV Vmax:       1.90 m/s MV Vmean:      94.2 cm/s MV Decel Time: 187 msec MV E velocity: 113.00 cm/s MV A velocity: 36.20 cm/s MV E/A ratio:  3.12 Vina Gull MD Electronically signed by Vina Gull MD Signature Date/Time:  06/03/2024/2:20:43 PM    Final      Medications:     Current Medications:  [MAR Hold] amiodarone   200 mg Oral Daily   [MAR Hold] Chlorhexidine  Gluconate Cloth  6 each Topical Daily   [MAR Hold] colestipol  2 g Oral BID   [MAR Hold] gabapentin   300 mg Oral BID   [MAR Hold] insulin  aspart  0-5 Units Subcutaneous QHS   [MAR Hold] insulin  aspart  0-9 Units Subcutaneous TID WC   [MAR Hold] levothyroxine   25 mcg Oral Daily   [MAR Hold] LORazepam   0.5 mg Oral QHS   [MAR Hold] metoCLOPramide (REGLAN) injection  10 mg Intravenous Q8H   [MAR Hold] mexiletine  300 mg Oral BID   [MAR Hold] midodrine  10 mg Oral TID WC   [MAR Hold] nystatin    Topical  BID   [MAR Hold] pantoprazole (PROTONIX) IV  40 mg Intravenous Q24H   [MAR Hold] saccharomyces boulardii  250 mg Oral BID   sodium chloride  flush  3 mL Intravenous Q12H    Infusions:  sodium chloride  75 mL/hr at 06/03/24 1358   sodium chloride      [MAR Hold] ceFEPime  (MAXIPIME ) IV     heparin  1,000 Units/hr (06/03/24 1048)   vancomycin  2,500 mg (06/03/24 1405)   [MAR Hold] vancomycin         Patient Profile   ZEVA LEBER is a 83 y.o. female with PMH of HFpEF, atrial fibrillation and VT s/p ICD now complicated by RV lead malfunction, CAD, HTN, Type 2DM, OSA, CKD IIIb, COPD and hypothyroidism initially admitted for GI illness and electrolyte disturbances.  She had missed oral medications due to GI illness and subsequently had episodes of MMVT.  EP team planning ultimate RV lead revision once medically stable. Pt also being actively treated for UTI. Pt has now developed shock, suspected septic shock. AHF team asked to assist w/ RHC to r/o cardiogenic shock component.   Assessment/Plan   1. Shock  - LA 2.9  - Suspect definite septic shock component w/ known UTI and leukocytosis WBC 14K  - BCx pending, continuing broad spectrum abx  - Check PCT  - trend LA for clearance  - RHC to r/o cardiogenic shock component  - midodrine for BP  support for now. May need transition to NE/VP pending course   2. Chronic Systolic Heart Failure - last echo prior to this admit 6/25 EF 70-75%, RV not well visualized  - Echo this admit EF 70-75%, RV mildly reduced - diuretics/ HF GDMT held on admit w/ soft BP and low volume status in setting of GI illness  - Plan RHC today to assess filling pressures and cardiac output  - no SGLT2i w/ UTI   3. Monomorphic VT  - previous h/o VT, s/p ICD and had been on amio and mexiletine PTA but missed several doses of meds due to N/V/D - VT prior to admit fell below her VT detection zone, EP has changed detection zone  - back on amio + mexiletine  - keep K > 4.0 and Mg > 2.0 - EP planning RV lead revision pending clinical course  4. Persistent Afib - rate controlled - continue amio  - heparin  gtt until completion of invasive procedures    5. Diarrhea - stool cx negative  - supportive care - Lyte supp PRN   6. UTI - abx per primary team     Length of Stay: [address] PA-C  06/03/2024, 3:43 PM    Advanced Heart Failure Team Pager [phone] (M-F; 7a - 5p)  Please contact [facility] for night-coverage after hours (4p -7a ) and weekends on [email or web address]  Patient seen with PA.  I formulated the plan and agree with the above note.   History as above.  Today, more hypotensive (SBP in 80s at time) with some confusion and lactate elevated to 2.9.    RHC was done: RHC Procedural Findings: Hemodynamics (mmHg) RA mean 13 RV 42/13 PA 42/27, mean 31 PCWP mean 16 Oxygen saturations: PA 60% AO 91% Cardiac Output (Fick) 4.78  Cardiac Index (Fick) 2.18 PVR 3.1 WU SVR 1205  PAPi 1.2   General: NAD Neck:Thick, JVP difficult, no thyromegaly or thyroid  nodule.  Lungs: Clear to auscultation bilaterally with normal respiratory effort. CV: Nondisplaced PMI.  Heart regular S1/S2, no S3/S4, no murmur.  No peripheral edema.  No carotid bruit.  Difficult to palapte pedal pulses.   Abdomen: Soft, nontender, no hepatosplenomegaly, no distention.  Skin: Intact without lesions or rashes.  Neurologic: Mild confusion/lethargy Psych: Normal affect. Extremities: No clubbing or cyanosis.  HEENT: Normal.   1. Shock: SBP 80s at times with lactate up to 2.9, BP hard to record.  Echo showed EF 70-75% with mild RV dysfunction, RHC with mild-moderate RV dysfunction.  I worry that this is a septic shock picture though SVR not markedly low, I do not think that I can explain the elevated lactate from her CHF.  I think we should keep her in 2H for now. WBCs rising, up to 14. Discussed with CCM who will follow with cardiology.  - Will place arterial line, BP has been difficult to obtain.  - She is on midodrine 10 tid.  - Will place PICC to follow CVP and co-ox.  - Broad spectrum abx, vancomycin /cefepime .   - Send procalcitonin.  2. 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 Creatinine mildly up at 1.27.  CXR with pulmonary edema vs infection.  - I will give Lasix  60 mg IV x 1 and follow response.   3. 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 Had been on Tikosyn  but this was not the typical Tikosyn -induced polymorphic VT and she had had no changes in Tikosyn  dosing prior to occurrence. cMRI showed subendocardial LGE in the basal to mid inferolateral wall. This appeared to be a coronary disease pattern however coronary angiogram did not show significant coronary disease. She was transitioned from Tikosyn  to amiodarone  and got Medtronic single chamber ICD on 09/15/20. VT on 2/23 and was started on mexiletine. VT again on 5/23 after missing amiodarone  and mexiletine. VT again prior to this admission (had been off her mexiletine and amiodarone  due to nauea/vomiting).  - Continue amiodarone  200 mg daily. Check TSH and LFTs today.  - Continue mexiletine 300  mg bid. - needs RV lead revision, planned for Thursday.  4. Atrial fibrillation/flutter: Persistent. 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  She had atrial fibrillation ablation in 4/18.  In 5/19, she went into atrial fibrillation transiently. In 10/19, she was started on Tikosyn  and DCCV.  Now off Tikosyn  and on amiodarone .  Multiple DCCVs.  She is now back in atrial fibrillation with controlled rate.  - Heparin  gtt for now.  - Would consider TEE-guided DCCV before discharge.  5. PAD: Left leg claudication with absent left PT pulse.  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 CTA in 5/24 showed right anterior tibial and peroneal arteries occluded, left CIA/EIA/internal iliac artery occluded with reconstitution of SFA.  - Follows w [facility] (Dr. Sheree).  6. ID: Initially presented with diarrhea/nausea/vomiting.  This has resolved.  However, now with concern for septic shock, cannot rule out PNA on CXR.  WBCs rising. Lactate elevated.  - As above, check procalcitonin.  - Broad spectrum abx with cefepime /vancomycin .   CRITICAL CARE Performed by: Ezra Shuck  Total critical care time: 70 minutes  Critical care time was exclusive of separately billable procedures and treating other patients.  Critical care was necessary to treat or prevent imminent or life-threatening deterioration.  Critical care was time spent personally by me on  the following activities: development of treatment plan with patient and/or surrogate as well as nursing, discussions with consultants, evaluation of patient's response to treatment, examination of patient, obtaining history from patient or surrogate, ordering and performing treatments and interventions, ordering and review of laboratory studies, ordering and review of radiographic studies, pulse oximetry and re-evaluation of patient's  condition.  Ezra Shuck 06/03/2024 4:46 PM

## 2024-06-03 NOTE — Progress Notes (Addendum)
 PHARMACY - ANTICOAGULATION CONSULT NOTE  Pharmacy Consult for heparin  infusion Indication: atrial fibrillation/hx of DVT  Allergies  Allergen Reactions   Neomycin-Bacitracin Zn-Polymyx Rash   Sulfa Antibiotics Diarrhea and Nausea And Vomiting   Ciprofloxacin  Hives, Itching and Rash    Pt doesn't remember having any reaction to cipro    Spironolactone  Rash    Patient Measurements: Height: 5' 6 (167.6 cm) Weight: 113.4 kg (250 lb) IBW/kg (Calculated) : 59.3 HEPARIN  DW (KG): 85.9  Vital Signs: Temp: 97.8 F (36.6 C) (11/17 2309) Temp Source: Oral (11/17 2309) BP: 93/57 (11/18 0512) Pulse Rate: 82 (11/18 0512)  Labs: Recent Labs    06/01/24 0556 06/02/24 0416 06/03/24 0554  HGB 14.9 14.3 14.5  HCT 44.5 42.9 43.0  PLT 124* 123* 142*  CREATININE 0.83 0.80 1.21*    Estimated Creatinine Clearance: 45.8 mL/min (A) (by C-G formula based on SCr of 1.21 mg/dL (H)).   Medical History: Past Medical History:  Diagnosis Date   Arthritis    hands, wrists (11/07/2016)   CHF (congestive heart failure) (HCC)    Chronic lower back pain    Constipation    DVT (deep venous thrombosis) (HCC)    History of blood transfusion 1990   related to OR   History of kidney stones    Hyperlipidemia    Hypertension    Joint pain    Lower extremity edema    OSA on CPAP    Persistent atrial fibrillation (HCC)    Pneumonia    several times (11/07/2016)   Type II diabetes mellitus (HCC)     Medications:  Scheduled:   amiodarone   200 mg Oral Daily   Chlorhexidine  Gluconate Cloth  6 each Topical Daily   colestipol   2 g Oral BID   gabapentin   300 mg Oral BID   insulin  aspart  0-5 Units Subcutaneous QHS   insulin  aspart  0-9 Units Subcutaneous TID WC   levothyroxine   25 mcg Oral Daily   LORazepam   0.5 mg Oral QHS   metoCLOPramide  (REGLAN ) injection  10 mg Intravenous Q8H   mexiletine  300 mg Oral BID   midodrine   10 mg Oral TID WC   nystatin    Topical BID   pantoprazole   (PROTONIX ) IV  40 mg Intravenous Q24H   saccharomyces boulardii  250 mg Oral BID   Infusions:   Assessment: 83 yo F presenting for vomiting and diarrhea. Now undergoing RV lead revision. PMH significant for MASH, Afib, hx of DVT, thrombocytopenia, VT s/p ICD. On Eliquis  PTA for Afib and hx of DVT, has been receiving it inpatient as well. Last dose 06/02/24 at 2258. Pharmacy consulted for heparin  management.  Hgb (14.5) and plts (142) are stable. Will continue heparin  infusion until lead revision, monitor via aPTT and heparin  level until correlating with recent DOAC use.   Goal of Therapy:  Heparin  level 0.3-0.7 units/ml aPTT 66-102 seconds Monitor platelets by anticoagulation protocol: Yes   Plan:  Start heparin  infusion at 1000 units/hr at 1000 11/18, no bolus Check 8 hour heparin  level/aPTT Monitor daily heparin  level and aPTT until correlating Monitor daily CBC and signs/symptoms of bleeding   Thank you for allowing pharmacy to be a part of this patient's care.   Nidia Schaffer, PharmD PGY2 Cardiology Pharmacy Resident  Please check AMION for all [facility] Pharmacy phone numbers After 10:00 PM, call Main Pharmacy [phone] 06/03/2024,7:59 AM

## 2024-06-03 NOTE — Progress Notes (Addendum)
  Patient Name: Heidi Stark Date of Encounter: 06/03/2024  Primary Cardiologist: Ezra Shuck, MD Electrophysiologist: Soyla Gladis Norton, MD  Interval Summary   Daughter is bedside Pt reports generally feel weak, no CP, denies SOB  Vital Signs    Vitals:   06/03/24 0133 06/03/24 0420 06/03/24 0512 06/03/24 0756  BP:  (!) 82/57 (!) 93/57 118/71  Pulse: 84 85 82 (!) 113  Resp: 15 13 14 16   Temp:    (!) 97.4 F (36.3 C)  TempSrc:    Oral  SpO2: 90% 92% 93%   Weight:      Height:        Intake/Output Summary (Last 24 hours) at 06/03/2024 0955 Last data filed at 06/02/2024 1430 Gross per 24 hour  Intake 1789.31 ml  Output 125 ml  Net 1664.31 ml   Filed Weights   05/22/24 2118  Weight: 113.4 kg    Physical Exam    GEN- NAD, Alert and oriented, appears ill Lungs- Clear to ausculation bilaterally (ant auscultation only), normal work of breathing Cardiac- Irregularly irregular rate and rhythm, no murmurs, rubs or gallops GI- soft, NT, ND Extremities- no clubbing or cyanosis. No edema  Telemetry    AF 70's-80's (personally reviewed)  Hospital Course    Heidi Stark is a 83 y.o. female with PMH of HFrecEF, VT s/p ICD now complicated by RV lead malfunction, AF, HTN, DM   admitted for GI illness and electrolyte disturbances.  She had missed oral medications due to GI illness and subsequently had episodes of MMVT.    Assessment & Plan    Elevated RV impedance No noise to raise concern for inappropriate shock. Therapies still on A chronic issue Patient is scheduled for elective RV lead replacement, though with recent VT, replacement prior to DC (if medically stable) would be reasonable  Persistent atrial fibrillation Recent interrogation suggest a very high burden of atrial fibrillation EKG, however, November 4 showed sinus rhythm Prior embolic phenomena    Monomorphic VT   VT prior to admit fell below her VT detection zone The VT detection zone was  changed to increase sensitivity (VT-1 changed from 176 to 150 bpm) Continue amiodarone  and mexiletine    She is scheduled for RV lead revision 06/05/24 Clinically I am not sure that she Yobani Schertzer be ready/stable for procedure This would not cause her current clinical presentation   Further as per: Attending cardiology team and IM, GI teams on board  CHF recovered EF History of cirrhosis Hypotension   Signed,  [name] Macario Arthur, PA-C  06/03/2024 9:55 AM     Rock Heidi Stark was seen by me today along with [name] Arthur. I have personally performed an evaluation on this patient.  My findings are as follows: 83 y.o. female feeling weak but no other general complaints.  Data: EKG(s) and pertinent labs, studies, etc were personally reviewed and interpreted by me:  telemetry Otherwise, I agree with data as outlined by the advanced practice provider.  Exam performed by me: Gen: no acute distress Neck: no JVD Cardiac: irregular Lungs: normal work of breathing Extremities: no edema  My Assessment and Plan:  Elevated RV impedence: therapies currently on. Montzerrat Brunell need RV lead revision. Patient wants to proceed with this. Planning Thursday. Bronco Mcgrory stop eliquis  and start heparin . Morio Widen continue to montior patient for stability for procedures. Persistent atrial fibrillation: rate controlled. Continue management. VT: continue amiodarone  and mexiletine  Signed,  Jaman Aro Gladis Norton, MD  06/03/2024 10:12 AM

## 2024-06-03 NOTE — H&P (View-Only) (Signed)
 Progress Note  Patient Name: Heidi Stark Date of Encounter: 06/03/2024 [place] [facility] Cardiologist: Heidi Shuck, MD   Interval Summary   Remains asymptomatic. Did not sleep well last night but could not explain why.   Vital Signs Vitals:   06/03/24 0130 06/03/24 0133 06/03/24 0420 06/03/24 0512  BP:   (!) 82/57 (!) 93/57  Pulse: 93 84 85 82  Resp: 13 15 13 14   Temp:      TempSrc:      SpO2: 90% 90% 92% 93%  Weight:      Height:        Intake/Output Summary (Last 24 hours) at 06/03/2024 0741 Last data filed at 06/02/2024 1430 Gross per 24 hour  Intake 1789.31 ml  Output 125 ml  Net 1664.31 ml      05/22/2024    9:18 PM 05/06/2024    5:09 PM 04/24/2024   11:46 AM  Last 3 Weights  Weight (lbs) 250 lb 250 lb --  Weight (kg) 113.399 kg 113.399 kg --      Telemetry/ECG  AF HR 85 PVCs - Personally Reviewed  Physical Exam  GEN: Chronically ill appearing in no acute distress laying in bed with [place] Cardiac: Irregularly, irregular, no murmurs, rubs, or gallops.  Respiratory: End expiratory wheezing MS: No edema, cold extremities and weak pulses  Assessment & Plan  Heidi Stark is a 83 y.o. female withno significant CAD on cardiac catheterization in 08/2020, chronic HFrEF with a EF as low as 30-35% in 2018 but has since normalized, paroxysmal atrial fibrillation/ flutter s/p atrial fibrillation ablation in 2018 with recurrence requiring multiple DCCVs (most recently in 10/2022 ) on Eliquis , VT initially while on Tikosyn  in 08/2020 but had recurrence after this was stopped s/p Medtronic ICD in 09/2020, PAD with a known occluded left external iliac artery, splenic and renal infarcts in 10/2013, hypertension, hyperlipidemia, type 2 diabetes, hypothyroidism, CKD stage III, obstructive sleep apnea on CPAP, and chronic low back pain  who was admitted for GI illness and electrolyte disturbances on 11/6.  She had missed oral medications due to GI illness and subsequently had  episodes of MMVT.    HFimEF Has been hypotensive this admission, on midodrine. Entresto  on hold since 11/14 2/2 hypotension Last dose of diuretic 11/11  No weight recorded since admission, RN was unable to obtain bed weight yesterday, will request weight today as patient has now moved floors.  Net IO Since Admission: 3,655.33 mL [06/03/24 0742]  Does not appear volume up, though body habitus does limit assessment. With extremities cool to touch and Cr. 0.8 -> 1.21. LFTs and lactic ordered. Patient unlikely to be a candidate for advanced therapies.  Echo pending  GDMT on hold 2/2 hypotension and ongoing GI illness Will hold on diuresis with hypotension   Hypotension  BP: 93/57 Orthostatic vitals on 11/14 (+). Received IV fluids.  Continue midodrine 10 mg TID, started 3 days ago    Elevated RV impedance Monomorphic VT [1 minute episode on telemetry, asymptomatic] EP following, planned RV lead replacement 11/20 with Dr. Inocencio Continue on mexiletine 300 mg Continue amiodarone  200 mg  BB on hold 2/2 hypotension   Persistent Atrial fibrillation  EP following.  Currently in rate controlled AF   BB on hold 2/2 hypotension Continue amiodarone  200 mg  Will transition eliquis  to IV heparin  today    Keep K >4 and Mag >2   PAD Minimal CAD Defer antiplatelet for chronic anticoagulation    History of  cirrhosis Per primary, though recommend close LFT follow-up as she is on amiodarone .   Per primary GI illness  Leukocytosis AKI on CKD  Urinary retention with UTI COPD T2DM Hypothyroidism  Thrombocytopenia OSA on CPAP Deconditioning    For questions or updates, please contact Heidi Stark Please consult [email or web address] for contact info under       Signed, Heidi LOISE Salen, PA-C     Patient seen and examined, note reviewed with the signed Advanced Practice Provider. I personally reviewed laboratory data, imaging studies and relevant notes. I independently  examined the patient and formulated the important aspects of the plan. I have personally discussed the plan with the patient and/or family. Comments or changes to the note/plan are indicated below.  Patient profile: This is an 83 year old female with past medical history of CAD, HFrEF and improved ejection fraction, paroxysmal atrial fibrillation/flutter s/p atrial fibrillation ablation in 2018 with recurrence requiring multiple cardioversions and on Eliquis , VT initially while on Tikosyn  in 2022 but had recurrence after this was stopped s/p Medtronic ICD in 2022, PAD with occluded left external iliac artery, splenic and renal infarcts in 2015, hypertension, hyperlipidemia, type 2 diabetes, CKD 3, OSA on CPAP who was admitted with GI illness and electrolyte disturbances on 11/6 leading her to miss multiple oral medications due to poor p.o. intake.   My Exam:  Physical Exam Physical Exam Vitals and nursing note reviewed.  Constitutional:      Appearance: She is ill-appearing.  Cardiovascular:     Rate and Rhythm: Normal rate. Rhythm irregular.  Pulmonary:     Effort: Pulmonary effort is normal.  Musculoskeletal:     Right lower leg: No edema.     Left lower leg: No edema.     Comments: Cool extremities  Neurological:     Mental Status: She is alert.      Assessment & Plan:   Heart failure improved ejection fraction with BiV ICD- SGLT2i discontinued due to soft Bps. Echo pending. Cool extremities on exam with hypotension. Hypotension-currently on midodrine.  Echocardiogram pending.  With leukocytosis there is concern for an infectious etiology. Will start an infectious workup Monomorphic VT-EP on board.  On mexiletine and amiodarone  Elevated RV impedance scheduled for elective RV lead replacement prior to discharge Persistent atrial fibrillation, currently rate controlled EP following.  Currently on Eliquis  PAD CAD on Eliquis  Possible cirrhosis GI illness-GI planning on outpatient  colonoscopy  Time coordinating patient care: 37 minutes  Signed, Heidi Calender, DO [place]  [facility]  06/03/2024 9:53 AM    Addendum 1:31 PM Patient has remained hypotensive requiring midodrine 10 mg 3 times daily.  She is also bit lethargic on my exam and daughter at bedside is concerned about her mental status as well.  She has had lactic acidosis, AKI today and leukocytosis today as well.  Also with cool extremities.  On personal read of the echocardiogram, the RV appears to be dilated with reduced function.   Will pursue a right heart catheterization for better volume and functional assessment.  I discussed risks and benefits with the patient and daughter at bedside who agreed to proceed.  Informed Consent   Shared Decision Making/Informed Consent The risks, including but not limited to, [bleeding or vascular complications (1 in 500), pneumothorax (1 in 1600), arrhythmia (1 in 1000) and death (1 in 5000)], benefits (diagnostic support and/or management of heart failure, pulmonary hypertension) and alternatives of a right heart catheterization were discussed in detail with Ms. Johal and  she is willing to proceed.

## 2024-06-03 NOTE — Progress Notes (Signed)
 Physical Therapy Treatment Patient Details Name: Heidi Stark MRN: [id number] DOB: 03/01/1941 Today's Date: 06/03/2024   History of Present Illness 83 y.o. female presents to [facility] hospital on 05/22/2024 with nausea, vomiting, diarrhea and weakness. Pt admitted for management of AKI, also with Vtach run on 11/7. PMH includes OSA, HTN, COPD, PAF, CKD III, DMII.    PT Comments  The pt was agreeable to session, but demos continued significant limitations in strength, activity tolerance, seated balance, and strength. Pt needing increased assist for all bed mobility, sitting balance, and standing attempt. She also declined more than single standing attempt despite max encouragement and assist available. BP stable with initial transition to sitting despite reports of dizziness, will continue to benefit from skilled PT acutely to progress functional strength, endurance, and transfer ability.   VITALS:  - supine in bed - BP: 85/62 (69); HR: 91bpm - sitting EOB - BP: 116/81 (92); HR: 93bpm - supine end of session - BP: 126/76 (91); HR: 98bpm    If plan is discharge home, recommend the following: Two people to help with walking and/or transfers;Two people to help with bathing/dressing/bathroom;Assistance with cooking/housework;Assist for transportation   Can travel by private vehicle     No  Equipment Recommendations  None recommended by PT    Recommendations for Other Services       Precautions / Restrictions Precautions Precautions: Fall Recall of Precautions/Restrictions: Intact Precaution/Restrictions Comments: Watch BP (orthostatic in sitting 11/14) Restrictions Weight Bearing Restrictions Per Provider Order: No     Mobility  Bed Mobility Overal bed mobility: Needs Assistance Bed Mobility: Rolling, Sidelying to Sit, Sit to Sidelying Rolling: Mod assist, +2 for physical assistance, Used rails Sidelying to sit: Used rails, Mod assist, +2 for physical assistance, HOB elevated      Sit to sidelying: Mod assist, +2 for physical assistance General bed mobility comments: assist for LE, trunk elevation, and mod-maxA for scooting to EOB due to limited effort from pt. assist to trunk and LE to return to supine    Transfers Overall transfer level: Needs assistance Equipment used: Rolling walker (2 wheels) Transfers: Sit to/from Stand Sit to Stand: Max assist, +2 physical assistance           General transfer comment: attempted single sit-stand with maxA of 2 and use of bed pad to help withhip lift and extension. pt declined second attempt despite all motivation. VSS    Ambulation/Gait               General Gait Details: unable      Balance Overall balance assessment: Needs assistance Sitting-balance support: Bilateral upper extremity supported, Feet supported Sitting balance-Leahy Scale: Poor Sitting balance - Comments: required modA initially due to posterior lean, then moments of minA. Postural control: Posterior lean Standing balance support: Bilateral upper extremity supported Standing balance-Leahy Scale: Zero Standing balance comment: Requiring maxA +2 to maintain standing.                            Communication Communication Communication: No apparent difficulties  Cognition Arousal: Alert, Lethargic Behavior During Therapy: Flat affect   PT - Cognitive impairments: No apparent impairments                       PT - Cognition Comments: pt with flat affect, but conversational at times. not formally assessed. Following commands: Intact      Cueing Cueing Techniques: Verbal cues, Tactile cues  Exercises General Exercises - Lower Extremity Long Arc Quad: Strengthening, Both, 10 reps, Seated (against min resistance) Heel Slides: Strengthening, Both, 10 reps, Supine, Seated (against min resistance) Hip ABduction/ADduction: AROM, Both, 5 reps, Supine    General Comments General comments (skin integrity, edema, etc.):  VSS on 4L, unable to get BP in sitting after 5 min but was stable initially      Pertinent Vitals/Pain Pain Assessment Pain Assessment: No/denies pain Faces Pain Scale: No hurt Pain Intervention(s): Monitored during session     PT Goals (current goals can now be found in the care plan section) Acute Rehab PT Goals Patient Stated Goal: to go home PT Goal Formulation: With patient/family Time For Goal Achievement: 06/08/24 Potential to Achieve Goals: Fair Progress towards PT goals: Not progressing toward goals - comment (more limited than last session)    Frequency    Min 2X/week       AM-PAC PT 6 Clicks Mobility   Outcome Measure  Help needed turning from your back to your side while in a flat bed without using bedrails?: A Lot Help needed moving from lying on your back to sitting on the side of a flat bed without using bedrails?: A Lot Help needed moving to and from a bed to a chair (including a wheelchair)?: A Lot Help needed standing up from a chair using your arms (e.g., wheelchair or bedside chair)?: A Lot Help needed to walk in hospital room?: Total Help needed climbing 3-5 steps with a railing? : Total 6 Click Score: 10    End of Session Equipment Utilized During Treatment: Gait belt Activity Tolerance: Patient tolerated treatment well Patient left: in bed;with call bell/phone within reach;with bed alarm set Nurse Communication: Mobility status;Other (comment) (BM and emesis - with nurse and MD) PT Visit Diagnosis: Other abnormalities of gait and mobility (R26.89);Muscle weakness (generalized) (M62.81)     Time: 9066-8997 PT Time Calculation (min) (ACUTE ONLY): 29 min  Charges:    $Therapeutic Exercise: 8-22 mins $Therapeutic Activity: 8-22 mins PT General Charges $$ ACUTE PT VISIT: 1 Visit                     Izetta Call, PT, DPT   Acute Rehabilitation Department Office [phone] Secure Chat Communication Preferred   Izetta JULIANNA Call 06/03/2024, 11:06 AM

## 2024-06-03 NOTE — Consult Note (Signed)
 NAME:  Heidi Stark, MRN:  [id number], DOB:  1941-01-24, LOS: 11 ADMISSION DATE:  05/22/2024, CONSULTATION DATE:  11/18 REFERRING MD:  Vergil, CHIEF COMPLAINT:  cardiogenic shock   History of Present Illness:  83 year old female w/multiple co-morbids (see below) Initially admitted 11/6 w/GI illness with N/V and Diarrhea. Was weak not able to take POs. Admitted to hospital team. GI consulted. Was dehydrated and hypotensive. Several of cardiac meds either not taken or held. Was re-hydrated, diarrhea subsided, course then complicated by recurrent VT.  She was seen by EP as well as advanced HF.  On 11/18 more hypotensive in spite of midodrine 10mg  tid. More lethargic, Mental status declined, her extremities were cool, she had leukocytosis and lactic acid of 2.9. ECHO reported as RV dilated w/ reduced fxn. Because of sudden clinical decline she was brought to the cardiac cath lab for right heart catheterization and subsequently transferred to the ICU post cath PCCM asked to assist w/ care Echo 11/18  EF 70-75% w/ hyperdynamic LVF  RV systolic fxn mildly reduced and mildly enlarged. 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 PAD, CAD, OSA on CPAP CKD stage III, hypothyroidism. EP lead malfxn Significant Hospital Events: Including procedures, antibiotic start and stop dates in addition to other pertinent events   11/6 admitted w. Diarrhea illness 11/7 Cards and EP consulted for freq VT 11/11 GI consulted for diarrhea 11/17 Orthostatic hypotension started back on IVFs got IV fluid boluses Echo 11/18 EF 70-75% w/ hyperdynamic LVF  RV systolic fxn mildly reduced and mildly enlarged. LA  dilated 11/18 more lethargic. BP still low   Interim History / Subjective:  No distress  Objective    Blood pressure (!) 155/66, pulse 85, temperature 98 F (36.7 C), temperature source Axillary, resp. rate (!) 26, height 5' 6 (1.676 m), weight 113.4 kg, SpO2 90%.    FiO2 (%):  [32 %] 32 %  No intake or output data in the 24 hours ending 06/03/24 1557 Filed Weights   05/22/24 2118  Weight: 113.4 kg    Examination: General: obese weak 83 year old female no distress  HENT: NCAT  Lungs: dec'd bases no accessory use  Cardiovascular: RRR  Abdomen: soft not tender  Extremities: scattered areas of ecchymosis and skin tears Neuro: oriented x 3 generalized weakness GU: inc   Resolved problem list  UTI treated 3ctx  Assessment and Plan   Undifferentiated Shock w/lactic acidosis (although I am not sure we are getting accurate data), superimposed on chronic systolic HF (HFpEF) + SIRS,echo hyperdynamic, RHC: shows RV dysfxn supported by papi of 1.2, index of 2.18 SVR 1205 Plan Place Aline Keep euvolemic, currently not on pressors Check CBC stat  Inotropic support for goal CI >2.0 and SVO2 ~70 Cont empiric broad spec abx w/ Cefepime  and vanc (started 11/18) F/u pending cultures Holding GDMT given shock state; was on lasix , entresto , inspra  and metoprolol   Acute hypoxic resp failure w/ right sided airspace disease ? PNA ? Asymmetric edema Plan Getting dose of lasix  per cards Will follow clinical trend F/u procal  Abx as above  Acute on chronic renal failure CKD IIIb w/urinary  retention  -FC placed 11/15 has since been removed Plan Ensure MAP>65 Strict I&O Renal dose meds Serial chems Flowmax held dt hypotension Repeat bladder scan  Monomorphic VT. 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 Infection  vs could also be hypercarbia Plan Supportive care Abg  OSA Plan CPAP HS vs BIPAP pending abg  Persistent AF Plan Rate control IV heparin  (hold doac for now pending procedures)  Heme positive stool -did have sig diarrhea  Plan Send CBC PPI GI already following may need to re-engage   H/o COPD Plan Cont BDs PRN  Diarrheal illness Improved. 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   Plan F/u PRN out pt  Sacral wounds w/ Moisture associated skin injury Plan See pics Air mattress  Maximize nutrition  Labs   CBC: Recent Labs  Lab 05/28/24 0334 05/29/24 0355 05/30/24 0412 05/31/24 0455 06/01/24 0556 06/02/24 0416 06/03/24 0554  WBC 10.8* 10.1 8.2 10.8* 7.9 7.9 14.4*  NEUTROABS 6.4 5.1 4.6 5.7 4.1  --   --   HGB 15.8* 15.0 14.9 15.5* 14.9 14.3 14.5  HCT 48.7* 46.1* 46.2* 48.8* 44.5 42.9 43.0  MCV 92.6 92.6 93.3 94.9 90.6 91.1 90.1  PLT 87* 103* 102* 121* 124* 123* 142*    Basic Metabolic Panel: Recent Labs  Lab 05/30/24 0412 05/31/24 0455 06/01/24 0556 06/02/24 0416 06/03/24 0554  NA 134* 133* 134* 133* 132*  K 3.7 4.1 3.7 3.7 3.6  CL 102 106 100 99 97*  CO2 19* 14* 21* 22 23  GLUCOSE 83 85 112* 106* 127*  BUN 18 16 15 18  27*  CREATININE 1.38* 1.04* 0.83 0.80 1.21*  CALCIUM  7.5* 7.4* 7.1* 6.9* 7.0*   GFR: Estimated Creatinine Clearance: 45.8 mL/min (A) (by C-G formula based on SCr of 1.21 mg/dL (H)). Recent Labs  Lab 05/31/24 0455 06/01/24 0556 06/02/24 0416 06/03/24 0554 06/03/24 1018 06/03/24 1316 06/03/24 1436  WBC 10.8* 7.9 7.9 14.4*  --   --   --   LATICACIDVEN  --   --   --   --  2.9* 2.3* 2.5*    Liver Function Tests: Recent Labs  Lab 06/03/24 0810  AST 37  ALT 23  ALKPHOS 59  BILITOT 0.8  PROT 5.1*  ALBUMIN 2.0*   No results for input(s): LIPASE, AMYLASE in the last 168 hours. No results  for input(s): AMMONIA in the last 168 hours.  ABG    Component Value Date/Time   PHART 7.322 (L) 08/10/2016 0017   PCO2ART 47.2 08/10/2016 0017   PO2ART 70.0 (L) 08/10/2016 0017   HCO3 24.4 08/10/2016 0017   TCO2 28 10/11/2020 1208   ACIDBASEDEF 2.0 08/10/2016 0017   O2SAT 92.0 08/10/2016 0017     Coagulation Profile: No results for input(s): INR, PROTIME in the last 168 hours.  Cardiac Enzymes: No results for input(s): CKTOTAL, CKMB, CKMBINDEX, TROPONINI in the last 168 hours.  HbA1C: Hemoglobin A1C  Date/Time Value Ref Range Status  12/21/2021 12:00 AM 6.0  Final    Comment:    Dr Becki office  10/26/2020 12:00 AM 8.9  Final   Hgb A1c MFr Bld  Date/Time Value Ref Range Status  01/30/2024 02:48 PM 6.2 4.6 -  6.5 % Final    Comment:    Glycemic Control Guidelines for People with Diabetes:Non Diabetic:  <6%Goal of Therapy: <7%Additional Action Suggested:  >8%   09/14/2023 03:28 PM 6.5 (H) <5.7 % of total Hgb Final    Comment:    For someone without known diabetes, a hemoglobin A1c value of 6.5% or greater indicates that they may have  diabetes and this should be confirmed with a follow-up  test. . For someone with known diabetes, a value <7% indicates  that their diabetes is well controlled and a value  greater than or equal to 7% indicates suboptimal  control. A1c targets should be individualized based on  duration of diabetes, age, comorbid conditions, and  other considerations. . Currently, no consensus exists regarding use of hemoglobin A1c for diagnosis of diabetes for children. .     CBG: Recent Labs  Lab 06/02/24 1109 06/02/24 1800 06/02/24 2249 06/03/24 0803 06/03/24 1154  GLUCAP 165* 152* 136* 128* 123*    Review of Systems:   No sob or cp  Past Medical History:  She,  has a past medical history of Arthritis, CHF (congestive heart failure) (HCC), Chronic lower back pain, Constipation, DVT (deep venous thrombosis) (HCC),  History of blood transfusion (1990), History of kidney stones, Hyperlipidemia, Hypertension, Joint pain, Lower extremity edema, OSA on CPAP, Persistent atrial fibrillation (HCC), Pneumonia, and Type II diabetes mellitus (HCC).   Surgical History:   Past Surgical History:  Procedure Laterality Date   ATRIAL FIBRILLATION ABLATION N/A 11/07/2016   Procedure: Atrial Fibrillation Ablation;  Surgeon: Will Gladis Norton, MD;  Location: [facility] INVASIVE CV LAB;  Service: Cardiovascular;  Laterality: N/A;   BACK SURGERY     CARDIAC CATHETERIZATION     CARDIOVERSION N/A 08/11/2016   Procedure: CARDIOVERSION;  Surgeon: Ezra GORMAN Shuck, MD;  Location: [facility] ENDOSCOPY;  Service: Cardiovascular;  Laterality: N/A;   CARDIOVERSION N/A 12/03/2017   Procedure: CARDIOVERSION;  Surgeon: Shuck Ezra GORMAN, MD;  Location: [facility] ENDOSCOPY;  Service: Cardiovascular;  Laterality: N/A;   CARDIOVERSION N/A 05/02/2018   Procedure: CARDIOVERSION;  Surgeon: Mona Vinie BROCKS, MD;  Location: [facility] ENDOSCOPY;  Service: Cardiovascular;  Laterality: N/A;   CARDIOVERSION N/A 06/23/2020   Procedure: CARDIOVERSION;  Surgeon: Hobart Powell BRAVO, MD;  Location: [facility] ENDOSCOPY;  Service: Cardiovascular;  Laterality: N/A;   CARDIOVERSION N/A 10/11/2020   Procedure: CARDIOVERSION;  Surgeon: Shuck Ezra GORMAN, MD;  Location: [facility] ENDOSCOPY;  Service: Cardiovascular;  Laterality: N/A;   CARPAL TUNNEL RELEASE     CATARACT EXTRACTION W/ INTRAOCULAR LENS  IMPLANT, BILATERAL Bilateral    COLON SURGERY  1990   vein graft and colon repair after nicked artery with back surgert   COLONOSCOPY     CORONARY ANGIOGRAPHY N/A 09/08/2020   Procedure: CORONARY ANGIOGRAPHY;  Surgeon: Shuck Ezra GORMAN, MD;  Location: [facility] INVASIVE CV LAB;  Service: Cardiovascular;  Laterality: N/A;   CYSTECTOMY     between bladder and kidneys   GANGLION CYST EXCISION Left    ICD IMPLANT N/A 09/15/2020   Procedure: ICD IMPLANT;  Surgeon: Norton Soyla Gladis, MD;  Location: [facility]  INVASIVE CV LAB;  Service: Cardiovascular;  Laterality: N/A;   KNEE CARTILAGE SURGERY Right 1960s   LUMBAR DISC SURGERY  1990   REPAIR ILIAC ARTERY  1990   vein graft and colon repair after nicked artery with back surgert   TEE WITHOUT CARDIOVERSION N/A 10/31/2013   Procedure: TRANSESOPHAGEAL ECHOCARDIOGRAM (TEE);  Surgeon: Ezra GORMAN Shuck, MD;  Location: [facility] ENDOSCOPY;  Service: Cardiovascular;  Laterality: N/A;   TUBAL LIGATION       Social History:   reports that she quit smoking about 3 years ago. Her smoking use included cigarettes. She started smoking about 53 years ago. She has a 50 pack-year smoking history. She has never used smokeless tobacco. She reports that she does not drink alcohol and does not use drugs.   Family History:  Her family history includes Cancer in her mother; Diabetes in her father, mother, and another family member; Factor V Leiden deficiency in her daughter; Hypertension in her mother; Melanoma in an other family member; Obesity in her father and mother.   Allergies Allergies  Allergen Reactions   Neomycin-Bacitracin Zn-Polymyx Rash   Sulfa Antibiotics Diarrhea and Nausea And Vomiting   Ciprofloxacin  Hives, Itching and Rash    Pt doesn't remember having any reaction to cipro    Spironolactone  Rash     Home Medications  Prior to Admission medications   Medication Sig Start Date End Date Taking? Authorizing Provider  amiodarone  (PACERONE ) 200 MG tablet TAKE 1 TABLET BY MOUTH EVERY DAY 08/23/23  Yes Rolan Ezra RAMAN, MD  apixaban  (ELIQUIS ) 5 MG TABS tablet TAKE 1 TABLET BY MOUTH TWICE A DAY 03/06/24  Yes McLean, Dalton S, MD  clotrimazole (LOTRIMIN) 1 % cream Apply to affected area 2 times daily 05/21/24  Yes Beverley Doffing A, PA-C  dapagliflozin  propanediol (FARXIGA ) 10 MG TABS tablet TAKE 1 TABLET BY MOUTH EVERY DAY BEFORE BREAKFAST 04/23/24  Yes Rolan Ezra RAMAN, MD  ENTRESTO  24-26 MG TAKE 1 TABLET BY MOUTH TWICE A DAY 04/01/24  Yes Rolan Ezra RAMAN, MD   eplerenone  (INSPRA ) 25 MG tablet Take 1 tablet (25 mg total) by mouth daily. 03/20/24  Yes Rolan Ezra RAMAN, MD  famotidine  (PEPCID ) 20 MG tablet Take 1 tablet (20 mg total) by mouth daily. 03/10/24  Yes Antonio Cyndee Rockers R, DO  fenofibrate  (TRICOR ) 145 MG tablet TAKE 1 TABLET BY MOUTH EVERY DAY 01/22/23  Yes Rolan Ezra RAMAN, MD  furosemide  (LASIX ) 40 MG tablet Take 80 mg by mouth 2 (two) times daily. 11/15/23  Yes [provider]  gabapentin  (NEURONTIN ) 300 MG capsule Take 300 mg by mouth 2 (two) times daily as needed (pain). 07/31/22  Yes [provider]  HUMALOG  MIX 75/25 KWIKPEN (75-25) 100 UNIT/ML KwikPen Take 10 units with morning meal and 10 units with evening meal Patient taking differently: Inject 0-30 Units into the skin daily. If BS is  100 or < = No Insulin  100 - 140 = 10 Units Over 140 = 30 Units in the Morning and 20 Units at Bedtime 12/27/23  Yes Rojelio Nest, DO  levothyroxine  (SYNTHROID ) 25 MCG tablet Take 25 mcg by mouth daily. 04/06/24  Yes [provider]  LIVALO  4 MG TABS TAKE 1 TABLET BY MOUTH EVERY DAY 03/06/24  Yes Rolan Ezra RAMAN, MD  LORazepam  (ATIVAN ) 0.5 MG tablet Take 1 tablet (0.5 mg total) by mouth at bedtime. 03/10/24  Yes Antonio Cyndee Rockers R, DO  metoprolol  succinate (TOPROL -XL) 25 MG 24 hr tablet TAKE 1 TABLET BY MOUTH EVERYDAY AT BEDTIME Patient taking differently: Take 25 mg by mouth daily. 04/23/23  Yes Hayes Lander L, NP  mexiletine (MEXITIL ) 150 MG capsule TAKE 2 CAPSULES (300 MG TOTAL) BY MOUTH 2 (TWO) TIMES DAILY. 05/10/23  Yes Camnitz, Will Gladis, MD  MOUNJARO 2.5 MG/0.5ML Pen Inject 2.5 mg into the skin once a week. Thursdays   Yes [provider]  Multiple  Vitamin (MULTIVITAMIN WITH MINERALS) TABS tablet Take 1 tablet by mouth daily.   Yes [provider]  ondansetron  (ZOFRAN -ODT) 4 MG disintegrating tablet Take 1 tablet (4 mg total) by mouth every 6 (six) hours as needed for nausea or vomiting. 05/21/24  Yes  Prosperi, Christian H, PA-C  potassium chloride  (MICRO-K ) 10 MEQ CR capsule TAKE 1 CAPSULE BY MOUTH 2 TIMES DAILY. 05/12/24  Yes Bensimhon, Toribio SAUNDERS, MD  sertraline  (ZOLOFT ) 25 MG tablet Take 1 tablet (25 mg total) by mouth daily. 03/10/24  Yes Antonio Cyndee Rockers R, DO  VASCEPA  1 g capsule TAKE 2 CAPSULES BY MOUTH TWICE A DAY 10/10/23  Yes Rolan Ezra RAMAN, MD  acetaminophen  (TYLENOL ) 500 MG tablet Take 500 mg by mouth every 6 (six) hours as needed for moderate pain (pain score 4-6) or headache.    [provider]  BD PEN NEEDLE NANO 2ND GEN 32G X 4 MM MISC See admin instructions. 09/24/21   [provider]  ondansetron  (ZOFRAN ) 8 MG tablet Take 1 tablet (8 mg total) by mouth every 8 (eight) hours as needed for nausea or vomiting. Patient not taking: Reported on 05/23/2024 05/02/24   Antonio Cyndee, Rockers SAUNDERS, DO  ONETOUCH VERIO test strip 1 each 3 (three) times daily. 10/09/21   [provider]     Critical care time:  I personally  spent 33 minutes  on this patient which included: review of medical records, nursing notes, progress notes, evaluation, interpretation of lab data and diagnostic studies, taking independent history, performing exam, documenting plan, ordering diagnostics and interventions for the following critical care issues: Circulatory shock with the following interventions which included: prevention of further deterioration

## 2024-06-03 NOTE — Progress Notes (Signed)
 Discussed PICC placement with Morgane Naveau MD in regards to recommendation of adjustment. Based on patient needs and difficulty of placement/exchange, MD agreed on plan to retract PICC 3cm.   This RN to bedside to pull back 3cm and redress accordingly. Primary RN aware.

## 2024-06-03 NOTE — Progress Notes (Addendum)
 eLink Physician-Brief Progress Note Patient Name: Heidi Stark DOB: 05/21/1941 MRN: [id number]   Date of Service  06/03/2024  HPI/Events of Note  83 year old admitted to the ICU with nonspecific complaints found to have recurrent VT and mixed shock  eICU Interventions  Add norepinephrine order    2220 -discontinue continuous IVF given concurrent diuretics.  Has been having urinary retention with greater than 500 cc in place.  Follow urinary retention protocol and replace Foley if she meets criteria.  KCl     Anija Brickner 06/03/2024, 7:59 PM

## 2024-06-04 ENCOUNTER — Encounter (HOSPITAL_COMMUNITY): Payer: Self-pay | Admitting: Cardiology

## 2024-06-04 ENCOUNTER — Ambulatory Visit: Admitting: Pulmonary Disease

## 2024-06-04 ENCOUNTER — Inpatient Hospital Stay (HOSPITAL_COMMUNITY)

## 2024-06-04 DIAGNOSIS — I5022 Chronic systolic (congestive) heart failure: Secondary | ICD-10-CM | POA: Diagnosis not present

## 2024-06-04 DIAGNOSIS — R579 Shock, unspecified: Secondary | ICD-10-CM | POA: Diagnosis not present

## 2024-06-04 DIAGNOSIS — L8990 Pressure ulcer of unspecified site, unspecified stage: Secondary | ICD-10-CM | POA: Insufficient documentation

## 2024-06-04 DIAGNOSIS — J9601 Acute respiratory failure with hypoxia: Secondary | ICD-10-CM | POA: Diagnosis not present

## 2024-06-04 DIAGNOSIS — N179 Acute kidney failure, unspecified: Secondary | ICD-10-CM | POA: Diagnosis not present

## 2024-06-04 DIAGNOSIS — G9340 Encephalopathy, unspecified: Secondary | ICD-10-CM

## 2024-06-04 DIAGNOSIS — K921 Melena: Secondary | ICD-10-CM

## 2024-06-04 DIAGNOSIS — J449 Chronic obstructive pulmonary disease, unspecified: Secondary | ICD-10-CM | POA: Diagnosis not present

## 2024-06-04 DIAGNOSIS — A419 Sepsis, unspecified organism: Secondary | ICD-10-CM

## 2024-06-04 LAB — GLUCOSE, CAPILLARY
Glucose-Capillary: 132 mg/dL — ABNORMAL HIGH (ref 70–99)
Glucose-Capillary: 134 mg/dL — ABNORMAL HIGH (ref 70–99)
Glucose-Capillary: 129 mg/dL — ABNORMAL HIGH (ref 70–99)

## 2024-06-04 LAB — COOXEMETRY PANEL
Carboxyhemoglobin: 1.6 % — ABNORMAL HIGH (ref 0.5–1.5)
Carboxyhemoglobin: 1.7 % — ABNORMAL HIGH (ref 0.5–1.5)
Methemoglobin: <0.7 % (ref 0.0–1.5)
Methemoglobin: <0.7 % (ref 0.0–1.5)
O2 Saturation: 71.2 %
O2 Saturation: 78.8 %
Total hemoglobin: 13.7 g/dL (ref 12.0–16.0)
Total hemoglobin: 14.3 g/dL (ref 12.0–16.0)

## 2024-06-04 LAB — BASIC METABOLIC PANEL WITH GFR
Anion gap: 16 — ABNORMAL HIGH (ref 5–15)
Anion gap: 13 (ref 5–15)
BUN: 39 mg/dL — ABNORMAL HIGH (ref 8–23)
BUN: 43 mg/dL — ABNORMAL HIGH (ref 8–23)
CO2: 20 mmol/L — ABNORMAL LOW (ref 22–32)
CO2: 20 mmol/L — ABNORMAL LOW (ref 22–32)
Calcium: 7.1 mg/dL — ABNORMAL LOW (ref 8.9–10.3)
Calcium: 7 mg/dL — ABNORMAL LOW (ref 8.9–10.3)
Chloride: 97 mmol/L — ABNORMAL LOW (ref 98–111)
Chloride: 101 mmol/L (ref 98–111)
Creatinine, Ser: 1.38 mg/dL — ABNORMAL HIGH (ref 0.44–1.00)
Creatinine, Ser: 1.35 mg/dL — ABNORMAL HIGH (ref 0.44–1.00)
GFR, Estimated: 38 mL/min — ABNORMAL LOW (ref >60)
GFR, Estimated: 39 mL/min — ABNORMAL LOW (ref >60)
Glucose, Bld: 150 mg/dL — ABNORMAL HIGH (ref 70–99)
Glucose, Bld: 130 mg/dL — ABNORMAL HIGH (ref 70–99)
Potassium: 4.6 mmol/L (ref 3.5–5.1)
Potassium: 4 mmol/L (ref 3.5–5.1)
Sodium: 133 mmol/L — ABNORMAL LOW (ref 135–145)
Sodium: 134 mmol/L — ABNORMAL LOW (ref 135–145)

## 2024-06-04 LAB — CBC
HCT: 42.6 % (ref 36.0–46.0)
Hemoglobin: 14.3 g/dL (ref 12.0–15.0)
MCH: 30.7 pg (ref 26.0–34.0)
MCHC: 33.6 g/dL (ref 30.0–36.0)
MCV: 91.4 fL (ref 80.0–100.0)
Platelets: 150 K/uL (ref 150–400)
RBC: 4.66 MIL/uL (ref 3.87–5.11)
RDW: 16.8 % — ABNORMAL HIGH (ref 11.5–15.5)
WBC: 16.7 K/uL — ABNORMAL HIGH (ref 4.0–10.5)
nRBC: 0.1 % (ref 0.0–0.2)

## 2024-06-04 LAB — MAGNESIUM: Magnesium: 2.1 mg/dL (ref 1.7–2.4)

## 2024-06-04 LAB — HEPARIN LEVEL (UNFRACTIONATED)
Heparin Unfractionated: >1.1 [IU]/mL — ABNORMAL HIGH (ref 0.30–0.70)
Heparin Unfractionated: >1.1 [IU]/mL — ABNORMAL HIGH (ref 0.30–0.70)

## 2024-06-04 LAB — APTT
aPTT: 108 s — ABNORMAL HIGH (ref 24–36)
aPTT: 58 s — ABNORMAL HIGH (ref 24–36)

## 2024-06-04 LAB — CG4 I-STAT (LACTIC ACID): Lactic Acid, Venous: 1.8 mmol/L (ref 0.5–1.9)

## 2024-06-04 MED ORDER — GERHARDT'S BUTT CREAM
TOPICAL_CREAM | Freq: Two times a day (BID) | CUTANEOUS | Status: DC
  Filled 2024-06-04: qty 60

## 2024-06-04 MED ORDER — IOHEXOL 350 MG/ML SOLN
75.0000 mL | Freq: Once | INTRAVENOUS | Status: AC | PRN
  Administered 2024-06-04: 75 mL via INTRAVENOUS

## 2024-06-04 MED ORDER — CALCIUM GLUCONATE-NACL 2-0.675 GM/100ML-% IV SOLN
2.0000 g | Freq: Once | INTRAVENOUS | Status: AC
  Administered 2024-06-04: 2000 mg via INTRAVENOUS
  Filled 2024-06-04: qty 100

## 2024-06-04 MED ORDER — LORAZEPAM 0.5 MG PO TABS
0.2500 mg | ORAL_TABLET | Freq: Every day | ORAL | Status: DC
  Administered 2024-06-04 – 2024-06-05 (×2): 0.25 mg via ORAL
  Filled 2024-06-04 (×2): qty 1

## 2024-06-04 MED ORDER — PANTOPRAZOLE SODIUM 40 MG IV SOLR
40.0000 mg | Freq: Two times a day (BID) | INTRAVENOUS | Status: DC
  Administered 2024-06-04 – 2024-06-05 (×3): 40 mg via INTRAVENOUS
  Filled 2024-06-04 (×3): qty 10

## 2024-06-04 MED ORDER — BOOST / RESOURCE BREEZE PO LIQD CUSTOM
1.0000 | Freq: Three times a day (TID) | ORAL | Status: DC
  Administered 2024-06-04 – 2024-06-05 (×4): 1 via ORAL
  Filled 2024-06-04: qty 1

## 2024-06-04 MED ORDER — GABAPENTIN 100 MG PO CAPS
100.0000 mg | ORAL_CAPSULE | Freq: Two times a day (BID) | ORAL | Status: DC
  Administered 2024-06-04 – 2024-06-05 (×3): 100 mg via ORAL
  Filled 2024-06-04 (×3): qty 1

## 2024-06-04 MED ORDER — FUROSEMIDE 10 MG/ML IJ SOLN
80.0000 mg | Freq: Once | INTRAMUSCULAR | Status: AC
  Administered 2024-06-04: 80 mg via INTRAVENOUS
  Filled 2024-06-04: qty 8

## 2024-06-04 NOTE — Progress Notes (Signed)
 PHARMACY - ANTICOAGULATION CONSULT NOTE  Pharmacy Consult for heparin  infusion Indication: atrial fibrillation/hx of DVT  Allergies  Allergen Reactions   Neomycin-Bacitracin Zn-Polymyx Rash   Sulfa Antibiotics Diarrhea and Nausea And Vomiting   Ciprofloxacin  Hives, Itching and Rash    Pt doesn't remember having any reaction to cipro    Spironolactone  Rash    Patient Measurements: Height: 5' 6 (167.6 cm) Weight: 113.4 kg (250 lb) IBW/kg (Calculated) : 59.3 HEPARIN  DW (KG): 85.9  Vital Signs: Temp: 97.6 F (36.4 C) (11/18 2000) Temp Source: Oral (11/18 2000) BP: 67/30 (11/18 1959) Pulse Rate: 87 (11/19 0400)  Labs: Recent Labs    06/03/24 0554 06/03/24 1611 06/03/24 1837 06/03/24 1855 06/03/24 1959 06/04/24 0134 06/04/24 0456  HGB 14.5   < > 14.3 15.3*  --   --  14.3  HCT 43.0   < > 42.9 45.0  --   --  42.6  PLT 142*  --  151  --   --   --  150  APTT  --   --   --   --   --   --  108*  HEPARINUNFRC  --   --   --   --   --   --  >1.10*  CREATININE 1.21*  --   --   --  1.14* 1.38* 1.35*   < > = values in this interval not displayed.    Estimated Creatinine Clearance: 41 mL/min (A) (by C-G formula based on SCr of 1.35 mg/dL (H)).   Infusions:   sodium chloride      ceFEPime  (MAXIPIME ) IV Stopped (06/03/24 2352)   heparin  1,000 Units/hr (06/04/24 0400)   norepinephrine (LEVOPHED) Adult infusion Stopped (06/03/24 2121)   [START ON 06/05/2024] vancomycin       Assessment: 83 yo F presenting for vomiting and diarrhea. Now undergoing RV lead revision. PMH significant for MASH, Afib, hx of DVT, thrombocytopenia, VT s/p ICD. On Eliquis  PTA for Afib and hx of DVT, has been receiving it inpatient as well. Last dose 06/02/24 at 2258. Pharmacy consulted for heparin  management.  S/p RHC today - resume to restart post cath. PICC line also placed after pt back on floor. Will monitor via aPTT and heparin  level until correlating with recent DOAC use.   11/19 AM update:  aPTT  supra-therapeutic  Goal of Therapy:  Heparin  level 0.3-0.7 units/ml aPTT 66-102 seconds Monitor platelets by anticoagulation protocol: Yes   Plan:  Dec heparin  to 900 units/hr Heparin  level and aPTT in 8 hours Monitor daily heparin  level and aPTT until correlating Monitor daily CBC and signs/symptoms of bleeding  Lynwood Mckusick, PharmD, BCPS Clinical Pharmacist Phone: [phone]

## 2024-06-04 NOTE — TOC Progression Note (Addendum)
 Transition of Care [facility]) - Progression Note    Patient Details  Name: Heidi Stark MRN: [id number] Date of Birth: 08-22-1940  Transition of [facility] CM/SW Contact  Arlana JINNY Nicholaus ISRAEL Phone Number: [phone] 06/04/2024, 12:23 PM  Clinical Narrative:   Patient and family chose [facility] at this time. CSW will follow up on medical readiness to see when to start insurance authorization.   Per the medical team, Patient recently transferred to ICU yesterday for shock, has stabilized, but GI reconsulted. Not ready SNF at this point. Will re-engage TOC with consult, when closer to being ready   [facility] CSW/CM will continue to follow and monitor for dc readiness.                       Expected Discharge Plan and Services                                               Social Drivers of Health (SDOH) Interventions SDOH Screenings   Food Insecurity: No Food Insecurity (05/23/2024)  Housing: Low Risk  (05/23/2024)  Transportation Needs: No Transportation Needs (05/23/2024)  Utilities: Not At Risk (05/23/2024)  Alcohol  Screen: Low Risk  (10/13/2021)  Depression (PHQ2-9): Medium Risk (05/06/2024)  Financial Resource Strain: Medium Risk (10/13/2021)  Physical Activity: Insufficiently Active (10/13/2021)  Social Connections: Moderately Integrated (05/23/2024)  Stress: No Stress Concern Present (10/13/2021)  Tobacco Use: Medium Risk (05/22/2024)    Readmission Risk Interventions    05/27/2024    1:34 PM 11/12/2023   10:09 AM  Readmission Risk Prevention Plan  Transportation Screening Complete Complete  PCP or Specialist Appt within 3-5 Days Complete Complete  HRI or Home Care Consult Complete Complete  Social Work Consult for Recovery Care Planning/Counseling Complete Complete  Palliative Care Screening Not Applicable Not Applicable  Medication Review Oceanographer) Referral to Pharmacy Complete

## 2024-06-04 NOTE — Progress Notes (Signed)
 Chart reviewed. Clinical deterioration noted, now in 2H, cardiogenic shock, RHC with noted primarily RH failure Hypotension with pressor support D/w Dr. Inocencio and Dr. Rolan, > will cancel RV lead revision for now Reconsider pending her clinical course. AFib 80's no VT On heparin  Continue amiodarone /mexiletine for her VT  EP will follow from afar. Please call if needed [name] Arthur, PA-C

## 2024-06-04 NOTE — Progress Notes (Signed)
 PHARMACY - ANTICOAGULATION CONSULT NOTE  Pharmacy Consult for heparin  infusion Indication: atrial fibrillation/hx of DVT  Allergies  Allergen Reactions   Neomycin-Bacitracin Zn-Polymyx Rash   Sulfa Antibiotics Diarrhea and Nausea And Vomiting   Ciprofloxacin  Hives, Itching and Rash    Pt doesn't remember having any reaction to cipro    Spironolactone  Rash    Patient Measurements: Height: 5' 6 (167.6 cm) Weight: 113.4 kg (250 lb) IBW/kg (Calculated) : 59.3 HEPARIN  DW (KG): 85.9  Vital Signs: Temp: 97.8 F (36.6 C) (11/19 0800) Temp Source: Oral (11/19 0800) Pulse Rate: 98 (11/19 1130)  Labs: Recent Labs    06/03/24 0554 06/03/24 1611 06/03/24 1837 06/03/24 1855 06/03/24 1959 06/04/24 0134 06/04/24 0456 06/04/24 1314  HGB 14.5   < > 14.3 15.3*  --   --  14.3  --   HCT 43.0   < > 42.9 45.0  --   --  42.6  --   PLT 142*  --  151  --   --   --  150  --   APTT  --   --   --   --   --   --  108* 58*  HEPARINUNFRC  --   --   --   --   --   --  >1.10* >1.10*  CREATININE 1.21*  --   --   --  1.14* 1.38* 1.35*  --    < > = values in this interval not displayed.    Estimated Creatinine Clearance: 41 mL/min (A) (by C-G formula based on SCr of 1.35 mg/dL (H)).   Infusions:   sodium chloride      calcium  gluconate     ceFEPime  (MAXIPIME ) IV 2 g (06/04/24 0924)   heparin  900 Units/hr (06/04/24 0726)   norepinephrine (LEVOPHED) Adult infusion 2 mcg/min (06/04/24 1335)   [START ON 06/05/2024] vancomycin       Assessment: 83 yo F presenting for vomiting and diarrhea. Now undergoing RV lead revision. PMH significant for MASH, Afib, hx of DVT, thrombocytopenia, VT s/p ICD. On Eliquis  PTA for Afib and hx of DVT, has been receiving it inpatient as well. Last dose 06/02/24 at 2258. Pharmacy consulted for heparin  management.  Heparin  level remains elevated (>1.1) given recent DOAC, aPTT came back subtherapeutic at 58, on heparin  infusion at 900 units/hr. No infusion issues. Of note,  having bloody stools. Discussed with CCM okay to continue heparin  for now given Hgb 14.3 on last check and monitor for recurrence.   Goal of Therapy:  Heparin  level 0.3-0.5 units/ml aPTT 66-85 seconds Monitor platelets by anticoagulation protocol: Yes   Plan:  Increase heparin  to 950 units/hr Order aPTT in 8 hours Monitor daily heparin  level and aPTT until correlating Monitor daily CBC and signs/symptoms of bleeding  Thank you for allowing pharmacy to participate in this patient's care,  Suzen Sour, PharmD, BCCCP Clinical Pharmacist  Phone: [phone] 06/04/2024 2:05 PM  Please check AMION for all [facility] Pharmacy phone numbers After 10:00 PM, call Main Pharmacy [phone]

## 2024-06-04 NOTE — Consult Note (Addendum)
 WOC Nurse Consult Note: Reason for Consult: Consult requested for buttocks, sacrum, abd fold, bilat heels. Pt was noted to have a darker-colored Deep tissue pressure injury to bilat buttocks/sacrum which is hospital acquired, but present on admission to [facility]. 25X26cm, according to the bedside nurses' wound care flow sheet.  There is a stage 2 pressure injury to inner sacrum, red and moist. Stage 2 pressure injury to left inner buttocks is red and moist, this was noted as present on admission in the bedside nurses' wound care flow sheet. .   Middle back with full thickness previous surgical stie wound, red and moist.   Bilat heels red but blanchable   Inner abd skin fold with full thickness skin loss, red and moist   Dressing procedure/placement/frequency: Topical treatment orders provided for bedside nurses to perform as follows: Prevalon boots to reduce pressure to bilat heels.  1. Apply Xeroform gauze to bilat buttocks/sacrum Q day, then cover with foam dressing.  Change foam dressing Q 3 days or PRN soiling.  2. Foam dressing to lower breast fold, change Q 3 days or PRN soiling 3. Cut small piece of Aquacel Soila # [id number]) and tuck over middle back wound and cover with foam dressing.  Change Q M/W/F. Moisten with NS to assist with removal.  WOC team will reassess bilat buttocks and sacrum Q 7-10 days to assess the need for topical treatment at that time.   Thank-you,  Stephane Fought MSN, RN, CWOCN, CWCN-AP, CNS Contact Mon-Fri 0700-1500: [phone]

## 2024-06-04 NOTE — Consult Note (Signed)
 NAME:  Heidi Stark, MRN:  [id number], DOB:  [birth year], LOS: 12 ADMISSION DATE:  05/22/2024, CONSULTATION DATE:  11/18 REFERRING MD:  Vergil, CHIEF COMPLAINT:  cardiogenic shock   History of Present Illness:  83 year old female w/multiple co-morbids (see below) Initially admitted 11/6 w/GI illness with N/V and Diarrhea. Was weak not able to take POs. Admitted to hospital team. GI consulted. Was dehydrated and hypotensive. Several of cardiac meds either not taken or held. Was re-hydrated, diarrhea subsided, course then complicated by recurrent VT.  She was seen by EP as well as advanced HF.  On 11/18 more hypotensive in spite of midodrine  10mg  tid. More lethargic, Mental status declined, her extremities were cool, she had leukocytosis and lactic acid of 2.9. ECHO reported as RV dilated w/ reduced fxn. Because of sudden clinical decline she was brought to the cardiac cath lab for right heart catheterization and subsequently transferred to the ICU post cath PCCM asked to assist w/ care Echo 11/18  EF 70-75% w/ hyperdynamic LVF  RV systolic fxn mildly reduced and mildly enlarged. 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 PAD, CAD, OSA on CPAP CKD stage III, hypothyroidism. EP lead malfxn Significant Hospital Events: Including procedures, antibiotic start and stop dates in addition to other pertinent events   11/6 admitted w. Diarrhea illness 11/7 Cards and EP consulted for freq VT 11/11 GI consulted for diarrhea 11/17 Orthostatic hypotension started back on IVFs got IV fluid boluses Echo 11/18 EF 70-75% w/ hyperdynamic LVF  RV systolic fxn mildly reduced and mildly enlarged. LA  dilated 11/18 more lethargic. BP still low  11/19 on and off levo overnight, patient has axillary A-line was placed yesterday, having blood-tinged type V to type VI stools  Interim History / Subjective:  Currently off pressors  Weak but less lethargic Oriented x 3, disoriented to time  Wore CPAP overnight   Objective    Blood pressure (!) 67/30, pulse 86, temperature 97.6 F (36.4 C), temperature source Oral, resp. rate 20, height 5' 6 (1.676 m), weight 113.4 kg, SpO2 93%. CVP:  [1 mmHg-7 mmHg] 7 mmHg      Intake/Output Summary (Last 24 hours) at 06/04/2024 9062 Last data filed at 06/04/2024 0700 Gross per 24 hour  Intake 852.55 ml  Output 700 ml  Net 152.55 ml   Filed Weights   05/22/24 2118  Weight: 113.4 kg    Examination: General: acute on chronic older adult female, deconditioned, lying in ICU bed HEENT: Normocephalic, PERRLA intact, Pink MM, missing teeth CV: s1,s2, RRR, no MRG, No JVD, ICD in place pulm: clear, diminished, no distress, on nasal cannula 4-5L Corazon  Abs: bs active, soft, blood-tinged stools Extremities: Generalized edema, no deformity, very weak Skin: Generalized bruising, DTIs on sacrum see image below  Neuro: Rass RASS 0 to -1, drowsy, oriented x 3 GU: Deferred     Resolved problem list  UTI treated 3ctx  Assessment and Plan   Undifferentiated Shock w/lactic acidosis superimposed on chronic systolic HF (HFpEF) + SIRS,echo hyperdynamic, RHC: shows RV dysfxn supported by papi of 1.2, index of 2.18 SVR 1205 Coox 79%,, CVP ~8, Creatinine 1.35.  WBC 7.9>14.4>14.6>16.7  11/19 lactic acid 1.8 from 2.5  P: Axillary A-line placed Continue MAP goal greater than 65 Continue broad-spectrum antibiotics- cefepime  and vanc  Continue holding GDMT due to shock state-  Continue aline, keep euvolemic, receiving diuresis  With slight uptrend in WBC- reached out to GI, and HF team- will obtain CT chest abdomen pelvis with contrast   Acute hypoxic resp  failure w/ right sided airspace disease OSA hx  COPD hx  ? PNA ? Asymmetric edema Patient decondition, weak cough at times- concern for PNA, asymmetric edema  Has been receiving diuresis, still + 3.8 Liters  P:  Continue adequate and aggressive pulmonary hygiene  Continue IS, deep breathing exercise, mobilize to chair- PT/OT On 4-5L , wean O2 as tolerated, O2 sat goal > 92% Continue diuresis per heart failure team  Continue abx  Utilize CPAP at night   Acute on chronic renal failure CKD IIIb w/urinary retention  -FC placed 11/15 has since been removed Still having on going retention issues, creatinine stable Receiving diuresis, did have to receive in and out cath overnight- ~700 out  P: Continue to trend renal function daily  Continue to monitor and optimize electrolytes daily Continue to monitor urine output Continue strict I/Os Continue Adequate renal perfusion  Avoid nephrotoxic agents-  Continue bethanechol for urine retention Have discussed with HF Team and GI in regards about obtaining CT chest, abdomen, pelvis with contrast, order placed   Monomorphic VT-resolved  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 Infection vs could also be hypercarbia Suspect this is multifactorial, but concerned for infection pna vs GI source?  Patient tired, drowsy, but oriented x 3 on morning exam  P: Continue to trend labs Follow up with CT chest, abdomen, pelvis  Continue CPAP prn and at night   Heme positive stool -did have sig diarrhea  Now  having blood tinged stools  Hgb stable P: Re-engaged GI to come evaluate, plan to obtain CT abdomen, pelvis To evaluate for ischemic etiology, or source  Appreciate GI's assistance   Diarrheal illness Improved. 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0* 14.3 15.3* 14.3  HCT 46.1* 46.2* 48.8* 44.5 42.9 43.0 48.0*  47.0* 42.9 45.0 42.6  MCV 92.6 93.3 94.9 90.6 91.1 90.1  --  90.9  --  91.4  PLT 103* 102* 121* 124* 123* 142*  --  151  --  150    Basic Metabolic Panel: Recent Labs  Lab 06/02/24 0416 06/03/24 0554 06/03/24 1611 06/03/24 1855 06/03/24 1959 06/04/24 0134 06/04/24 0456  NA 133* 132* 137  137 133* 137 133* 134*  K 3.7 3.6 3.6  3.5 3.4* 3.3* 4.6 4.0  CL 99 97*  --   --  100 97* 101  CO2 22 23  --   --  20* 20* 20*  GLUCOSE 106* 127*  --   --  117* 150* 130*  BUN 18 27*  --   --  38* 39* 43*  CREATININE 0.80 1.21*  --   --  1.14* 1.38* 1.35*  CALCIUM  6.9* 7.0*  --   --  6.6* 7.1* 7.0*  MG  --   --   --   --  2.0 2.1  --    GFR: Estimated Creatinine Clearance: 41 mL/min (A) (by C-G formula based on  SCr of 1.35 mg/dL (H)). Recent Labs  Lab 06/02/24 0416 06/03/24 0554 06/03/24 1018 06/03/24 1316 06/03/24 1436 06/03/24 1837 06/03/24 1850 06/04/24 0456 06/04/24 0457  PROCALCITON  --   --   --   --   --  0.55  --   --   --   WBC 7.9 14.4*  --   --   --  14.6*  --  16.7*  --   LATICACIDVEN  --   --    < > 2.3* 2.5*  --  1.8  --  1.8   < > = values in this interval not displayed.    Liver Function Tests: Recent Labs  Lab 06/03/24 0810  AST 37  ALT 23  ALKPHOS 59  BILITOT 0.8  PROT 5.1*  ALBUMIN 2.0*   No results for input(s): LIPASE, AMYLASE in the last 168 hours. No results for input(s): AMMONIA in the last 168 hours.  ABG    Component Value Date/Time   PHART 7.338 (L) 06/03/2024 1855   PCO2ART 45.4 06/03/2024 1855   PO2ART 83 06/03/2024 1855   HCO3 24.2 06/03/2024 1855   TCO2 26 06/03/2024 1855   ACIDBASEDEF 2.0 06/03/2024 1855   O2SAT 78.8 06/04/2024 0503     Coagulation Profile: No results for input(s): INR, PROTIME in the last 168 hours.  Cardiac Enzymes: No results for input(s): CKTOTAL, CKMB, CKMBINDEX, TROPONINI in the last 168 hours.  HbA1C: Hemoglobin A1C  Date/Time Value Ref Range Status  12/21/2021 12:00 AM 6.0  Final    Comment:    Dr Becki office  10/26/2020 12:00 AM 8.9  Final   Hgb A1c MFr Bld  Date/Time Value Ref Range Status  01/30/2024 02:48 PM 6.2 4.6 - 6.5 % Final    Comment:    Glycemic Control Guidelines for People with Diabetes:Non Diabetic:  <6%Goal of Therapy: <7%Additional Action Suggested:  >8%   09/14/2023 03:28 PM 6.5 (H) <5.7 % of total Hgb Final    Comment:    For someone without known diabetes, a hemoglobin A1c value of 6.5% or greater indicates that they may have  diabetes and this should be confirmed with a follow-up  test. . For someone with known diabetes, a value <  7% indicates  that their diabetes is well controlled and a value  greater than or equal to 7% indicates suboptimal  control.  A1c targets should be individualized based on  duration of diabetes, age, comorbid conditions, and  other considerations. . Currently, no consensus exists regarding use of hemoglobin A1c for diagnosis of diabetes for children. .     CBG: Recent Labs  Lab 06/02/24 2249 06/03/24 0803 06/03/24 1154 06/03/24 2138 06/04/24 0617  GLUCAP 136* 128* 123* 121* 132*    Review of Systems:   No sob or cp  Past Medical History:  She,  has a past medical history of Arthritis, CHF (congestive heart failure) (HCC), Chronic lower back pain, Constipation, DVT (deep venous thrombosis) (HCC), History of blood transfusion (1990), History of kidney stones, Hyperlipidemia, Hypertension, Joint pain, Lower extremity edema, OSA on CPAP, Persistent atrial fibrillation (HCC), Pneumonia, and Type II diabetes mellitus (HCC).   Surgical History:   Past Surgical History:  Procedure Laterality Date   ATRIAL FIBRILLATION ABLATION N/A 11/07/2016   Procedure: Atrial Fibrillation Ablation;  Surgeon: Will Gladis Norton, MD;  Location: [facility] INVASIVE CV LAB;  Service: Cardiovascular;  Laterality: N/A;   BACK SURGERY     CARDIAC CATHETERIZATION     CARDIOVERSION N/A 08/11/2016   Procedure: CARDIOVERSION;  Surgeon: Ezra GORMAN Shuck, MD;  Location: [facility] ENDOSCOPY;  Service: Cardiovascular;  Laterality: N/A;   CARDIOVERSION N/A 12/03/2017   Procedure: CARDIOVERSION;  Surgeon: Shuck Ezra GORMAN, MD;  Location: [facility] ENDOSCOPY;  Service: Cardiovascular;  Laterality: N/A;   CARDIOVERSION N/A 05/02/2018   Procedure: CARDIOVERSION;  Surgeon: Mona Vinie BROCKS, MD;  Location: [facility] ENDOSCOPY;  Service: Cardiovascular;  Laterality: N/A;   CARDIOVERSION N/A 06/23/2020   Procedure: CARDIOVERSION;  Surgeon: Hobart Powell BRAVO, MD;  Location: [facility] ENDOSCOPY;  Service: Cardiovascular;  Laterality: N/A;   CARDIOVERSION N/A 10/11/2020   Procedure: CARDIOVERSION;  Surgeon: Shuck Ezra GORMAN, MD;  Location: [facility] ENDOSCOPY;  Service:  Cardiovascular;  Laterality: N/A;   CARPAL TUNNEL RELEASE     CATARACT EXTRACTION W/ INTRAOCULAR LENS  IMPLANT, BILATERAL Bilateral    COLON SURGERY  1990   vein graft and colon repair after nicked artery with back surgert   COLONOSCOPY     CORONARY ANGIOGRAPHY N/A 09/08/2020   Procedure: CORONARY ANGIOGRAPHY;  Surgeon: Shuck Ezra GORMAN, MD;  Location: [facility] INVASIVE CV LAB;  Service: Cardiovascular;  Laterality: N/A;   CYSTECTOMY     between bladder and kidneys   GANGLION CYST EXCISION Left    ICD IMPLANT N/A 09/15/2020   Procedure: ICD IMPLANT;  Surgeon: Norton Soyla Gladis, MD;  Location: [facility] INVASIVE CV LAB;  Service: Cardiovascular;  Laterality: N/A;   KNEE CARTILAGE SURGERY Right 1960s   LUMBAR DISC SURGERY  1990   REPAIR ILIAC ARTERY  1990   vein graft and colon repair after nicked artery with back surgert   RIGHT HEART CATH N/A 06/03/2024   Procedure: RIGHT HEART CATH;  Surgeon: Shuck Ezra GORMAN, MD;  Location: [facility] INVASIVE CV LAB;  Service: Cardiovascular;  Laterality: N/A;   TEE WITHOUT CARDIOVERSION N/A 10/31/2013   Procedure: TRANSESOPHAGEAL ECHOCARDIOGRAM (TEE);  Surgeon: Ezra GORMAN Shuck, MD;  Location: [facility] ENDOSCOPY;  Service: Cardiovascular;  Laterality: N/A;   TUBAL LIGATION       Social History:   reports that she quit smoking about 3 years ago. Her smoking use included cigarettes. She started smoking about 53 years ago. She has a 50 pack-year smoking history. She has never used smokeless tobacco. She  reports that she does not drink alcohol and does not use drugs.   Family History:  Her family history includes Cancer in her mother; Diabetes in her father, mother, and another family member; Factor V Leiden deficiency in her daughter; Hypertension in her mother; Melanoma in an other family member; Obesity in her father and mother.   Allergies Allergies  Allergen Reactions   Neomycin-Bacitracin Zn-Polymyx Rash   Sulfa Antibiotics Diarrhea and Nausea And Vomiting    Ciprofloxacin  Hives, Itching and Rash    Pt doesn't remember having any reaction to cipro    Spironolactone  Rash     Home Medications  Prior to Admission medications   Medication Sig Start Date End Date Taking? Authorizing Provider  amiodarone  (PACERONE ) 200 MG tablet TAKE 1 TABLET BY MOUTH EVERY DAY 08/23/23  Yes Rolan Ezra RAMAN, MD  apixaban  (ELIQUIS ) 5 MG TABS tablet TAKE 1 TABLET BY MOUTH TWICE A DAY 03/06/24  Yes McLean, Dalton S, MD  clotrimazole (LOTRIMIN) 1 % cream Apply to affected area 2 times daily 05/21/24  Yes Beverley Doffing A, PA-C  dapagliflozin  propanediol (FARXIGA ) 10 MG TABS tablet TAKE 1 TABLET BY MOUTH EVERY DAY BEFORE BREAKFAST 04/23/24  Yes Rolan Ezra RAMAN, MD  ENTRESTO  24-26 MG TAKE 1 TABLET BY MOUTH TWICE A DAY 04/01/24  Yes Rolan Ezra RAMAN, MD  eplerenone  (INSPRA ) 25 MG tablet Take 1 tablet (25 mg total) by mouth daily. 03/20/24  Yes Rolan Ezra RAMAN, MD  famotidine  (PEPCID ) 20 MG tablet Take 1 tablet (20 mg total) by mouth daily. 03/10/24  Yes Antonio Cyndee Rockers R, DO  fenofibrate  (TRICOR ) 145 MG tablet TAKE 1 TABLET BY MOUTH EVERY DAY 01/22/23  Yes Rolan Ezra RAMAN, MD  furosemide  (LASIX ) 40 MG tablet Take 80 mg by mouth 2 (two) times daily. 11/15/23  Yes [provider]  gabapentin  (NEURONTIN ) 300 MG capsule Take 300 mg by mouth 2 (two) times daily as needed (pain). 07/31/22  Yes [provider]  HUMALOG  MIX 75/25 KWIKPEN (75-25) 100 UNIT/ML KwikPen Take 10 units with morning meal and 10 units with evening meal Patient taking differently: Inject 0-30 Units into the skin daily. If BS is  100 or < = No Insulin  100 - 140 = 10 Units Over 140 = 30 Units in the Morning and 20 Units at Bedtime 12/27/23  Yes Rojelio Nest, DO  levothyroxine  (SYNTHROID ) 25 MCG tablet Take 25 mcg by mouth daily. 04/06/24  Yes [provider]  LIVALO  4 MG TABS TAKE 1 TABLET BY MOUTH EVERY DAY 03/06/24  Yes Rolan Ezra RAMAN, MD  LORazepam  (ATIVAN ) 0.5 MG tablet Take 1 tablet (0.5  mg total) by mouth at bedtime. 03/10/24  Yes Antonio Cyndee Rockers JONELLE, DO  metoprolol  succinate (TOPROL -XL) 25 MG 24 hr tablet TAKE 1 TABLET BY MOUTH EVERYDAY AT BEDTIME Patient taking differently: Take 25 mg by mouth daily. 04/23/23  Yes Hayes Beckey CROME, NP  mexiletine (MEXITIL ) 150 MG capsule TAKE 2 CAPSULES (300 MG TOTAL) BY MOUTH 2 (TWO) TIMES DAILY. 05/10/23  Yes Camnitz, Will Gladis, MD  MOUNJARO 2.5 MG/0.5ML Pen Inject 2.5 mg into the skin once a week. Thursdays   Yes [provider]  Multiple Vitamin (MULTIVITAMIN WITH MINERALS) TABS tablet Take 1 tablet by mouth daily.   Yes [provider]  ondansetron  (ZOFRAN -ODT) 4 MG disintegrating tablet Take 1 tablet (4 mg total) by mouth every 6 (six) hours as needed for nausea or vomiting. 05/21/24  Yes Prosperi, Christian H, PA-C  potassium chloride  (  MICRO-K ) 10 MEQ CR capsule TAKE 1 CAPSULE BY MOUTH 2 TIMES DAILY. 05/12/24  Yes Bensimhon, Toribio SAUNDERS, MD  sertraline  (ZOLOFT ) 25 MG tablet Take 1 tablet (25 mg total) by mouth daily. 03/10/24  Yes Antonio Cyndee Rockers R, DO  VASCEPA  1 g capsule TAKE 2 CAPSULES BY MOUTH TWICE A DAY 10/10/23  Yes Rolan Ezra RAMAN, MD  acetaminophen  (TYLENOL ) 500 MG tablet Take 500 mg by mouth every 6 (six) hours as needed for moderate pain (pain score 4-6) or headache.    [provider]  BD PEN NEEDLE NANO 2ND GEN 32G X 4 MM MISC See admin instructions. 09/24/21   [provider]  ondansetron  (ZOFRAN ) 8 MG tablet Take 1 tablet (8 mg total) by mouth every 8 (eight) hours as needed for nausea or vomiting. Patient not taking: Reported on 05/23/2024 05/02/24   Antonio Cyndee, Rockers SAUNDERS, DO  ONETOUCH VERIO test strip 1 each 3 (three) times daily. 10/09/21   [provider]     CC: 50 mins   Sherlean Sharps AGACNP-BC   [facility] 06/04/2024, 10:16 AM  Please see [email or web address] for pager details.  From 7A-7P if no response, please call [phone]. After hours, please call  ELink [phone].

## 2024-06-04 NOTE — Progress Notes (Signed)
 Patient ID: Heidi Stark, female   DOB: 1941-07-12, 83 y.o.   MRN: [id number]     Advanced Heart Failure Rounding Note  Cardiologist: Ezra Shuck, MD  Chief Complaint: CHF Subjective:    BP better today, SBP > 120 from arterial line.  Was on NE overnight, now off.  Last lactate down to 1.8.  Co-ox 79%, CVP around 8 on my read.  She is on 5L home oxygen.  Creatinine stable 1.35.   Noted to have blood in stool though hgb still 14.3.    PCT 0.55, afebrile, WBCs 16.7.  She is on vancomycin /cefepime  for ?PNA.  CXR consistent with PNA vs edema.   Echo showed EF 70-75%, mild RV dysfunction.   RHC: RHC Procedural Findings: Hemodynamics (mmHg) RA mean 13 RV 42/13 PA 42/27, mean 31 PCWP mean 16 Oxygen saturations: PA 60% AO 91% Cardiac Output (Fick) 4.78  Cardiac Index (Fick) 2.18 PVR 3.1 WU SVR 1205  PAPi 1.2    Objective:   Weight Range: 113.4 kg Body mass index is 40.35 kg/m.   Vital Signs:   Temp:  [97.6 F (36.4 C)-98 F (36.7 C)] 97.6 F (36.4 C) (11/18 2000) Pulse Rate:  [74-111] 86 (11/19 0730) Resp:  [11-26] 20 (11/19 0730) BP: (67-155)/(30-101) 67/30 (11/18 1959) SpO2:  [86 %-100 %] 93 % (11/19 0730) Arterial Line BP: (34-148)/(-35-72) 129/59 (11/19 0730) Last BM Date : 06/03/24  Weight change: Filed Weights   05/22/24 2118  Weight: 113.4 kg    Intake/Output:   Intake/Output Summary (Last 24 hours) at 06/04/2024 0820 Last data filed at 06/04/2024 0700 Gross per 24 hour  Intake 852.55 ml  Output 700 ml  Net 152.55 ml      Physical Exam    General:  Well appearing. No resp difficulty HEENT: Normal Neck: Supple. JVP difficult with thick neck. Carotids 2+ bilat; no bruits. No lymphadenopathy or thyromegaly appreciated. Cor: PMI nondisplaced. Regular rate & rhythm. No rubs, gallops or murmurs. Lungs: Crackles/rhonchi bilaterally.  Abdomen: Soft, nontender, nondistended. No hepatosplenomegaly. No bruits or masses. Good bowel  sounds. Extremities: No cyanosis, clubbing, rash, edema Neuro: Lethargic but will follow commands, cranial nerves grossly intact. moves all 4 extremities w/o difficulty.    Telemetry   Atrial fibrillation rate 80s (personally reviewed)  Labs    CBC Recent Labs    06/03/24 1837 06/03/24 1855 06/04/24 0456  WBC 14.6*  --  16.7*  HGB 14.3 15.3* 14.3  HCT 42.9 45.0 42.6  MCV 90.9  --  91.4  PLT 151  --  150   Basic Metabolic Panel Recent Labs    88/81/74 1959 06/04/24 0134 06/04/24 0456  NA 137 133* 134*  K 3.3* 4.6 4.0  CL 100 97* 101  CO2 20* 20* 20*  GLUCOSE 117* 150* 130*  BUN 38* 39* 43*  CREATININE 1.14* 1.38* 1.35*  CALCIUM  6.6* 7.1* 7.0*  MG 2.0 2.1  --    Liver Function Tests Recent Labs    06/03/24 0810  AST 37  ALT 23  ALKPHOS 59  BILITOT 0.8  PROT 5.1*  ALBUMIN 2.0*   No results for input(s): LIPASE, AMYLASE in the last 72 hours. Cardiac Enzymes No results for input(s): CKTOTAL, CKMB, CKMBINDEX, TROPONINI in the last 72 hours.  BNP: BNP (last 3 results) Recent Labs    10/11/23 1548 02/29/24 1747  BNP 166.6* 240.0*    ProBNP (last 3 results) No results for input(s): PROBNP in the last 8760 hours.   D-Dimer No  results for input(s): DDIMER in the last 72 hours. Hemoglobin A1C No results for input(s): HGBA1C in the last 72 hours. Fasting Lipid Panel No results for input(s): CHOL, HDL, LDLCALC, TRIG, CHOLHDL, LDLDIRECT in the last 72 hours. Thyroid  Function Tests No results for input(s): TSH, T4TOTAL, T3FREE, THYROIDAB in the last 72 hours.  Invalid input(s): FREET3  Other results:   Imaging    DG CHEST PORT 1 VIEW Result Date: 06/03/2024 EXAM: 1 VIEW(S) XRAY OF THE CHEST 06/03/2024 09:21:00 PM COMPARISON: 06/03/2024 CLINICAL HISTORY: S/P PICC central line placement. FINDINGS: LINES, TUBES AND DEVICES: Right upper extremity PICC advanced. Tip overlies right atrium. Left chest AICD in  place. LUNGS AND PLEURA: Slightly worsening pulmonary edema. Slightly worsening patchy airspace opacities in right upper lung. Trace bilateral pleural effusions. No pneumothorax. HEART AND MEDIASTINUM: Dense mitral annular calcifications. Persistent cardiomegaly. Aortic atherosclerosis. BONES AND SOFT TISSUES: No acute osseous abnormality. IMPRESSION: 1. Interval advancement of the right PICC with tip overlying the right atrium. Recommend retracting by 5cm. 2. Slightly worsening pulmonary edema and patchy right upper lung airspace opacities, suspicious for edema with possible superimposed infection or inflammation. 3. Trace bilateral pleural effusions. Electronically signed by: Morgane Naveau MD 06/03/2024 09:52 PM EST RP Workstation: [id number]   DG CHEST PORT 1 VIEW Result Date: 06/03/2024 EXAM: 1 VIEW(S) XRAY OF THE CHEST 06/03/2024 06:40:29 PM COMPARISON: 06/03/2024 CLINICAL HISTORY: Status post PICC central line placement FINDINGS: LINES, TUBES AND DEVICES: New right PICC with tip overlying the brachiocephalic confluence with the SVC. Left single lead cardiac defibrillator device in place. LUNGS AND PLEURA: Mild diffuse interstitial opacities with more patchy right upper lung opacities. Trace bilateral pleural effusions. No pneumothorax. HEART AND MEDIASTINUM: Stable cardiomegaly. Aortic atherosclerosis. BONES AND SOFT TISSUES: No acute osseous abnormality. IMPRESSION: 1. Mild diffuse interstitial opacities with more focal right upper lung opacities. 2. Trace bilateral pleural effusions. 3. New right PICC with tip at the brachiocephalic confluence with the SVC. Electronically signed by: Morgane Naveau MD 06/03/2024 07:28 PM EST RP Workstation: [id number]   US  EKG SITE RITE Result Date: 06/03/2024 If Site Rite image not attached, placement could not be confirmed due to current cardiac rhythm.  CARDIAC CATHETERIZATION Result Date: 06/03/2024 1. Elevated RA pressure out of proportion to PCWP, primarily  RV failure.  PAPi low. 2. CO low but not markedly low. 3. Mild mixed pulmonary arterial/pulmonary venous hypertension.   DG CHEST PORT 1 VIEW Result Date: 06/03/2024 CLINICAL DATA:  Acute respiratory failure. EXAM: PORTABLE CHEST 1 VIEW COMPARISON:  05/26/2024. FINDINGS: Stable cardiomegaly. Unchanged left-sided AICD. Aortic atherosclerosis. Increased asymmetric interstitial and patchy airspace opacities throughout the right lung. No sizable pleural effusion or pneumothorax. No acute osseous abnormality. IMPRESSION: 1. Increased asymmetric interstitial and patchy airspace opacities throughout the right lung could reflect asymmetric pulmonary edema or an infectious/inflammatory etiology. 2. Stable cardiomegaly. Electronically Signed   By: Harrietta Sherry M.D.   On: 06/03/2024 14:28   ECHOCARDIOGRAM COMPLETE Result Date: 06/03/2024    ECHOCARDIOGRAM REPORT   Patient Name:   MARGY SUMLER Date of Exam: 06/03/2024 Medical Rec #:  [id number]      Height:       66.0 in Accession #:    [id number]     Weight:       250.0 lb Date of Birth:  1940-08-22      BSA:          2.199 m Patient Age:    83 years       BP:  118/71 mmHg Patient Gender: F              HR:           121 bpm. Exam Location:  Inpatient Procedure: 2D Echo, Cardiac Doppler and Color Doppler (Both Spectral and Color            Flow Doppler were utilized during procedure). Indications:    Congestive Heart Failure I50.9  History:        Patient has prior history of Echocardiogram examinations, most                 recent 12/18/2023. CHF, Arrythmias:Atrial Fibrillation; Risk                 Factors:Hypertension, Diabetes and Sleep Apnea.  Sonographer:    Jayson Gaskins Referring Phys: [id number] LOGAN N LOCKWOOD IMPRESSIONS  1. Left ventricular ejection fraction, by estimation, is 70 to 75%. The left ventricle has hyperdynamic function. There is mild left ventricular hypertrophy.  2. Right ventricular systolic function is mildly reduced. The right  ventricular size is mildly enlarged.  3. Left atrial size was moderately dilated.  4. Right atrial size was mildly dilated.  5. Trivial mitral valve regurgitation. Moderate mitral annular calcification.  6. The aortic valve has an indeterminant number of cusps. Aortic valve regurgitation is not visualized. Comparison(s): The left ventricular function is unchanged. FINDINGS  Left Ventricle: Left ventricular ejection fraction, by estimation, is 70 to 75%. The left ventricle has hyperdynamic function. The left ventricular internal cavity size was normal in size. There is mild left ventricular hypertrophy. Right Ventricle: The right ventricular size is mildly enlarged. Right vetricular wall thickness was not assessed. Right ventricular systolic function is mildly reduced. Left Atrium: Left atrial size was moderately dilated. Right Atrium: Right atrial size was mildly dilated. Pericardium: There is no evidence of pericardial effusion. Mitral Valve: There is mild thickening of the mitral valve leaflet(s). Moderate mitral annular calcification. Trivial mitral valve regurgitation. MV peak gradient, 14.4 mmHg. The mean mitral valve gradient is 4.5 mmHg. Tricuspid Valve: The tricuspid valve is normal in structure. Tricuspid valve regurgitation is mild. Aortic Valve: The aortic valve has an indeterminant number of cusps. Aortic valve regurgitation is not visualized. Aortic valve mean gradient measures 5.0 mmHg. Aortic valve peak gradient measures 11.0 mmHg. Aortic valve area, by VTI measures 2.22 cm. Pulmonic Valve: The pulmonic valve was not well visualized. Aorta: The aortic root and ascending aorta are structurally normal, with no evidence of dilitation. IAS/Shunts: No atrial level shunt detected by color flow Doppler. Additional Comments: A device lead is visualized.  LEFT VENTRICLE PLAX 2D LVIDd:         4.30 cm   Diastology LVIDs:         3.30 cm   LV e' medial:    10.00 cm/s LV PW:         1.20 cm   LV E/e' medial:   11.3 LV IVS:        1.20 cm   LV e' lateral:   14.40 cm/s LVOT diam:     2.00 cm   LV E/e' lateral: 7.8 LV SV:         46 LV SV Index:   21 LVOT Area:     3.14 cm  LEFT ATRIUM           Index        RIGHT ATRIUM           Index LA  Vol (A2C): 40.9 ml 18.60 ml/m  RA Area:     23.50 cm LA Vol (A4C): 98.3 ml 44.71 ml/m  RA Volume:   75.00 ml  34.11 ml/m  AORTIC VALVE AV Area (Vmax):    2.06 cm AV Area (Vmean):   2.41 cm AV Area (VTI):     2.22 cm AV Vmax:           166.00 cm/s AV Vmean:          103.000 cm/s AV VTI:            0.207 m AV Peak Grad:      11.0 mmHg AV Mean Grad:      5.0 mmHg LVOT Vmax:         109.00 cm/s LVOT Vmean:        79.000 cm/s LVOT VTI:          0.146 m LVOT/AV VTI ratio: 0.71  AORTA Ao Root diam: 2.80 cm MITRAL VALVE MV Area (PHT): 4.06 cm     SHUNTS MV Area VTI:   1.31 cm     Systemic VTI:  0.15 m MV Peak grad:  14.4 mmHg    Systemic Diam: 2.00 cm MV Mean grad:  4.5 mmHg MV Vmax:       1.90 m/s MV Vmean:      94.2 cm/s MV Decel Time: 187 msec MV E velocity: 113.00 cm/s MV A velocity: 36.20 cm/s MV E/A ratio:  3.12 Vina Gull MD Electronically signed by Vina Gull MD Signature Date/Time: 06/03/2024/2:20:43 PM    Final      Medications:     Scheduled Medications:  amiodarone   200 mg Oral Daily   bethanechol  10 mg Oral TID   Chlorhexidine  Gluconate Cloth  6 each Topical Daily   colestipol  2 g Oral BID   furosemide   80 mg Intravenous Once   gabapentin   300 mg Oral BID   insulin  aspart  0-5 Units Subcutaneous QHS   insulin  aspart  0-9 Units Subcutaneous TID WC   levothyroxine   25 mcg Oral Daily   LORazepam   0.5 mg Oral QHS   metoCLOPramide (REGLAN) injection  10 mg Intravenous Q8H   mexiletine  300 mg Oral BID   midodrine  10 mg Oral TID WC   nystatin    Topical BID   pantoprazole (PROTONIX) IV  40 mg Intravenous Q24H   saccharomyces boulardii  250 mg Oral BID   sodium chloride  flush  10-40 mL Intracatheter Q12H    Infusions:  sodium chloride      ceFEPime   (MAXIPIME ) IV Stopped (06/03/24 2352)   heparin  900 Units/hr (06/04/24 0726)   norepinephrine (LEVOPHED) Adult infusion Stopped (06/03/24 2121)   [START ON 06/05/2024] vancomycin       PRN Medications: Place/Maintain arterial line **AND** sodium chloride , acetaminophen , guaiFENesin -dextromethorphan, ondansetron  (ZOFRAN ) IV, mouth rinse, sodium chloride  flush   Assessment/Plan   1. Shock: SBP 80s at times with lactate up to 2.9 on 11/18, BP hard to obtain by cuff.  Echo showed EF 70-75% with mild RV dysfunction, RHC with mild-moderate RV dysfunction.  I worried that this was a septic shock picture though SVR not markedly low, I do not think that I can explain the elevated lactate from her CHF.  She was on NE transiently overnight, now off with stable BP on arterial line.  Hard to say if hypotension was from sepsis vs difficulty recording cuff BP. PCT was not markedly elevated at 0.55. Co-ox good at 79%.  -  Can continue midodrine  10 tid.  - Broad spectrum abx, vancomycin /cefepime .   2. 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 Creatinine mildly up at 1.27 => 1.35.  CXR with pulmonary edema vs PNA.  Crackles bilaterally on lung exam.  CVP around 7-8 this morning.  - Though CVP not particularly, she has a wet lung exam and CXR may have edema.  I am going to give Lasix  80 mg IV x 1 this morning and follow response.  3. 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 Had been on Tikosyn  but this was not the typical Tikosyn -induced polymorphic VT and she had had no changes in Tikosyn  dosing prior to occurrence. cMRI showed subendocardial LGE in the basal to mid inferolateral wall. This appeared to be a coronary disease pattern however coronary angiogram did not show significant coronary disease. She was transitioned from Tikosyn  to amiodarone  and got [employer] single  chamber ICD on 09/15/20. VT on 2/23 and was started on mexiletine. VT again on 5/23 after missing amiodarone  and mexiletine. VT again prior to this admission (had been off her mexiletine and amiodarone  due to nauea/vomiting).  - Continue amiodarone  200 mg daily. Check TSH and LFTs today.  - Continue mexiletine 300 mg bid. - needs RV lead revision, EP will delay lead revision based on how sick she is, needs to stabilize and then get this done (was supposed to be Thursday).  4. Atrial fibrillation/flutter: Persistent. 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  She had atrial fibrillation ablation in 4/18.  In 5/19, she went into atrial fibrillation transiently. In 10/19, she was started on Tikosyn  and DCCV.  Now off Tikosyn  and on amiodarone .  Multiple DCCVs.  She is now back in atrial fibrillation with controlled rate.  - Heparin  gtt for now.  - Would consider TEE-guided DCCV before discharge.  5. PAD: Left leg claudication with absent left PT pulse.  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 CTA in 5/24 showed right anterior tibial and peroneal arteries occluded, left CIA/EIA/internal iliac artery occluded with reconstitution of SFA.  - Follows w [facility] (Dr. Sheree).  6. ID: Initially presented with diarrhea/nausea/vomiting.  This has resolved.  However, now with concern for septic shock, cannot rule out PNA on CXR.  WBCs elevated 16.7. Lactate elevated on 11/18.  PCT not markedly elevated at 0.55.   - Broad spectrum abx with cefepime /vancomycin .  7. GI: Profuse diarrhea when she initially arrived in hospital.  Now with hematochezia.  Hgb remains stable 14.3.  - Discussed with CCM, to get GI consult.   Needs PT/OT.   CRITICAL CARE Performed by: Ezra Shuck  Total critical care time: 40 minutes  Critical care time was exclusive of separately billable procedures and treating other  patients.  Critical care was necessary to treat or prevent imminent or life-threatening deterioration.  Critical care was time spent personally by me on the following activities: development of treatment plan with patient and/or surrogate as well as nursing, discussions with consultants, evaluation of patient's response to treatment, examination of patient, obtaining history from patient or surrogate, ordering and performing treatments and interventions, ordering and review of laboratory studies, ordering and review of radiographic studies, pulse oximetry and re-evaluation of patient's condition.   Length of Stay: 12  Ezra Shuck, MD  06/04/2024, 8:20 AM  Advanced Heart Failure Team Pager [phone] (M-F; 7a - 5p)  Please contact [facility] for night-coverage after hours (5p -7a ) and weekends on [email or web address]

## 2024-06-04 NOTE — Progress Notes (Signed)
 Occupational Therapy Treatment Patient Details Name: Heidi Stark MRN: [id number] DOB: [birth year] Today's Date: 06/04/2024   History of present illness 83 y.o. female presents to [facility] hospital on 05/22/2024 with nausea, vomiting, diarrhea and weakness. Pt admitted for management of AKI, also with Vtach run on 11/7. PMH includes OSA, HTN, COPD, PAF, CKD III, DMII.   OT comments  Pt progressing towards goals. Pt lethargic during session, but agreeable to short session before her scheduled procedure. Pt required max +2 assist to complete all bed mobility, and requires max assist to maintain EOB. Only tolerating sitting EOB <5 minutes, before reporting fatigue and requesting to lay down. Updated d/c recs to  <3 hours of skilled rehab daily. Will continue to follow acutely.       If plan is discharge home, recommend the following:  Two people to help with walking and/or transfers;A lot of help with bathing/dressing/bathroom;Assistance with cooking/housework;Assist for transportation;Help with stairs or ramp for entrance   Equipment Recommendations  Other (comment) (defer to next venue)       Precautions / Restrictions Precautions Precautions: Fall Recall of Precautions/Restrictions: Intact Precaution/Restrictions Comments: Watch BP (orthostatic in sitting 11/14) Restrictions Weight Bearing Restrictions Per Provider Order: No       Mobility Bed Mobility Overal bed mobility: Needs Assistance Bed Mobility: Rolling, Sidelying to Sit, Sit to Sidelying Rolling: Max assist, +2 for safety/equipment, +2 for physical assistance, Used rails Sidelying to sit: Max assist, +2 for physical assistance, +2 for safety/equipment     Sit to sidelying: Max assist, +2 for physical assistance, +2 for safety/equipment, Used rails General bed mobility comments: Assist for trunk and legs, pt with minimal initation for BLEs    Transfers       General transfer comment: deferred     Balance Overall  balance assessment: Needs assistance Sitting-balance support: Bilateral upper extremity supported, Feet supported Sitting balance-Leahy Scale: Poor Sitting balance - Comments: required mod to max assist for posterior lean at all times Postural control: Posterior lean           ADL either performed or assessed with clinical judgement   ADL Overall ADL's : Needs assistance/impaired       General ADL Comments: Session focused on sitting balance    Extremity/Trunk Assessment Upper Extremity Assessment Upper Extremity Assessment: Generalized weakness   Lower Extremity Assessment Lower Extremity Assessment: Defer to PT evaluation        Vision   Vision Assessment?: No apparent visual deficits         Communication Communication Communication: No apparent difficulties   Cognition Arousal: Lethargic Behavior During Therapy: Flat affect Cognition: Difficult to assess Difficult to assess due to: Level of arousal           OT - Cognition Comments: Pt lethargic, with few verbalizations during session, but able to answer questions appropriately     Following commands: Intact        Cueing   Cueing Techniques: Verbal cues, Tactile cues        General Comments VSS on 6L    Pertinent Vitals/ Pain       Pain Assessment Pain Assessment: Faces Faces Pain Scale: Hurts little more Pain Location: chronic low back Pain Descriptors / Indicators: Aching Pain Intervention(s): Limited activity within patient's tolerance   Frequency  Min 2X/week        Progress Toward Goals  OT Goals(current goals can now be found in the care plan section)  Progress towards OT goals: Progressing toward goals  Acute Rehab OT Goals Patient Stated Goal: To lay down OT Goal Formulation: With patient Time For Goal Achievement: 06/13/24 Potential to Achieve Goals: Good ADL Goals Pt Will Perform Upper Body Dressing: with set-up;sitting Pt Will Transfer to Toilet: with min  assist;stand pivot transfer;bedside commode Pt Will Perform Toileting - Clothing Manipulation and hygiene: with min assist;sitting/lateral leans  Plan         AM-PAC OT 6 Clicks Daily Activity     Outcome Measure   Help from another person eating meals?: None Help from another person taking care of personal grooming?: A Little Help from another person toileting, which includes using toliet, bedpan, or urinal?: Total Help from another person bathing (including washing, rinsing, drying)?: A Lot Help from another person to put on and taking off regular upper body clothing?: A Lot Help from another person to put on and taking off regular lower body clothing?: Total 6 Click Score: 13    End of Session Equipment Utilized During Treatment: Other (comment) (stedy)  OT Visit Diagnosis: Unsteadiness on feet (R26.81);Other abnormalities of gait and mobility (R26.89);Muscle weakness (generalized) (M62.81)   Activity Tolerance Patient limited by fatigue;Patient limited by pain   Patient Left in bed;with call bell/phone within reach   Nurse Communication Mobility status        Time: 1116-1140 OT Time Calculation (min): 24 min  Charges: OT General Charges $OT Visit: 1 Visit OT Treatments $Self Care/Home Management : 8-22 mins  Adrianne BROCKS, OT  Acute Rehabilitation Services Office [phone] Secure chat preferred   Adrianne GORMAN Savers 06/04/2024, 1:07 PM

## 2024-06-05 ENCOUNTER — Ambulatory Visit (HOSPITAL_COMMUNITY): Admission: RE | Admit: 2024-06-05 | Source: Home / Self Care | Admitting: Cardiology

## 2024-06-05 ENCOUNTER — Telehealth: Payer: Self-pay | Admitting: Family Medicine

## 2024-06-05 ENCOUNTER — Encounter (HOSPITAL_COMMUNITY): Admission: RE | Payer: Self-pay | Source: Home / Self Care

## 2024-06-05 DIAGNOSIS — I5022 Chronic systolic (congestive) heart failure: Secondary | ICD-10-CM | POA: Diagnosis not present

## 2024-06-05 DIAGNOSIS — J449 Chronic obstructive pulmonary disease, unspecified: Secondary | ICD-10-CM | POA: Diagnosis not present

## 2024-06-05 DIAGNOSIS — J189 Pneumonia, unspecified organism: Secondary | ICD-10-CM

## 2024-06-05 DIAGNOSIS — J9601 Acute respiratory failure with hypoxia: Secondary | ICD-10-CM | POA: Diagnosis not present

## 2024-06-05 DIAGNOSIS — R579 Shock, unspecified: Secondary | ICD-10-CM | POA: Diagnosis not present

## 2024-06-05 DIAGNOSIS — N179 Acute kidney failure, unspecified: Secondary | ICD-10-CM | POA: Diagnosis not present

## 2024-06-05 LAB — GLUCOSE, CAPILLARY
Glucose-Capillary: 135 mg/dL — ABNORMAL HIGH (ref 70–99)
Glucose-Capillary: 157 mg/dL — ABNORMAL HIGH (ref 70–99)
Glucose-Capillary: 131 mg/dL — ABNORMAL HIGH (ref 70–99)
Glucose-Capillary: 136 mg/dL — ABNORMAL HIGH (ref 70–99)

## 2024-06-05 LAB — CBC
HCT: 44.4 % (ref 36.0–46.0)
Hemoglobin: 14.5 g/dL (ref 12.0–15.0)
MCH: 29.7 pg (ref 26.0–34.0)
MCHC: 32.7 g/dL (ref 30.0–36.0)
MCV: 90.8 fL (ref 80.0–100.0)
Platelets: 127 K/uL — ABNORMAL LOW (ref 150–400)
RBC: 4.89 MIL/uL (ref 3.87–5.11)
RDW: 17.1 % — ABNORMAL HIGH (ref 11.5–15.5)
WBC: 15.4 K/uL — ABNORMAL HIGH (ref 4.0–10.5)
nRBC: 0.1 % (ref 0.0–0.2)

## 2024-06-05 LAB — BASIC METABOLIC PANEL WITH GFR
Anion gap: 12 (ref 5–15)
BUN: 53 mg/dL — ABNORMAL HIGH (ref 8–23)
CO2: 21 mmol/L — ABNORMAL LOW (ref 22–32)
Calcium: 7.3 mg/dL — ABNORMAL LOW (ref 8.9–10.3)
Chloride: 99 mmol/L (ref 98–111)
Creatinine, Ser: 1.49 mg/dL — ABNORMAL HIGH (ref 0.44–1.00)
GFR, Estimated: 35 mL/min — ABNORMAL LOW (ref >60)
Glucose, Bld: 116 mg/dL — ABNORMAL HIGH (ref 70–99)
Potassium: 4.4 mmol/L (ref 3.5–5.1)
Sodium: 132 mmol/L — ABNORMAL LOW (ref 135–145)

## 2024-06-05 LAB — APTT
aPTT: 116 s — ABNORMAL HIGH (ref 24–36)
aPTT: 172 s (ref 24–36)
aPTT: 198 s (ref 24–36)
aPTT: 42 s — ABNORMAL HIGH (ref 24–36)

## 2024-06-05 LAB — HEPATIC FUNCTION PANEL
ALT: 31 U/L (ref 0–44)
AST: 44 U/L — ABNORMAL HIGH (ref 15–41)
Albumin: 1.9 g/dL — ABNORMAL LOW (ref 3.5–5.0)
Alkaline Phosphatase: 61 U/L (ref 38–126)
Bilirubin, Direct: 0.4 mg/dL — ABNORMAL HIGH (ref 0.0–0.2)
Indirect Bilirubin: 0.5 mg/dL (ref 0.3–0.9)
Total Bilirubin: 0.9 mg/dL (ref 0.0–1.2)
Total Protein: 5.1 g/dL — ABNORMAL LOW (ref 6.5–8.1)

## 2024-06-05 LAB — COOXEMETRY PANEL
Carboxyhemoglobin: 1.1 % (ref 0.5–1.5)
Methemoglobin: <0.7 % (ref 0.0–1.5)
O2 Saturation: 78.3 %
Total hemoglobin: 13.7 g/dL (ref 12.0–16.0)

## 2024-06-05 LAB — HEPARIN LEVEL (UNFRACTIONATED): Heparin Unfractionated: >1.1 [IU]/mL — ABNORMAL HIGH (ref 0.30–0.70)

## 2024-06-05 SURGERY — LEAD REVISION/REPAIR

## 2024-06-05 MED ORDER — VANCOMYCIN HCL 1500 MG/300ML IV SOLN
1500.0000 mg | INTRAVENOUS | Status: DC
  Administered 2024-06-05: 1500 mg via INTRAVENOUS
  Filled 2024-06-05: qty 300

## 2024-06-05 MED ORDER — ARFORMOTEROL TARTRATE 15 MCG/2ML IN NEBU
15.0000 ug | INHALATION_SOLUTION | Freq: Two times a day (BID) | RESPIRATORY_TRACT | Status: DC
  Administered 2024-06-05: 15 ug via RESPIRATORY_TRACT
  Filled 2024-06-05 (×2): qty 2

## 2024-06-05 MED ORDER — FUROSEMIDE 10 MG/ML IJ SOLN
80.0000 mg | Freq: Four times a day (QID) | INTRAMUSCULAR | Status: AC
  Administered 2024-06-05 (×2): 80 mg via INTRAVENOUS
  Filled 2024-06-05 (×2): qty 8

## 2024-06-05 MED ORDER — ALTEPLASE 2 MG IJ SOLR
2.0000 mg | Freq: Once | INTRAMUSCULAR | Status: DC
  Filled 2024-06-05: qty 2

## 2024-06-05 MED ORDER — IPRATROPIUM-ALBUTEROL 0.5-2.5 (3) MG/3ML IN SOLN: 3.0000 mL | RESPIRATORY_TRACT | Status: DC | PRN

## 2024-06-05 MED ORDER — HYDRALAZINE HCL 20 MG/ML IJ SOLN: 10.0000 mg | Freq: Four times a day (QID) | INTRAMUSCULAR | Status: DC | PRN

## 2024-06-05 MED ORDER — HEPARIN (PORCINE) 25000 UT/250ML-% IV SOLN
800.0000 [IU]/h | INTRAVENOUS | Status: DC
  Administered 2024-06-05: 800 [IU]/h via INTRAVENOUS

## 2024-06-05 MED ORDER — REVEFENACIN 175 MCG/3ML IN SOLN
175.0000 ug | Freq: Every day | RESPIRATORY_TRACT | Status: DC
  Filled 2024-06-05: qty 3

## 2024-06-05 MED ORDER — MIDODRINE HCL 5 MG PO TABS
5.0000 mg | ORAL_TABLET | Freq: Three times a day (TID) | ORAL | Status: DC
  Administered 2024-06-05: 5 mg via ORAL

## 2024-06-05 NOTE — Progress Notes (Signed)
 Physical Therapy Treatment Patient Details Name: Heidi Stark MRN: [id number] DOB: [birth year] Today's Date: 06/05/2024   History of Present Illness 83 y.o. female presents to [facility] hospital on 05/22/2024 with nausea, vomiting, diarrhea and weakness. Pt admitted for management of AKI, also with Vtach run on 11/7. PMH includes OSA, HTN, COPD, PAF, CKD III, DMII.    PT Comments  Pt is fluctuating in functional capabilities. Currently slightly improved from last session with pt significantly declined in functional mobility. Pt currently Max A for bed mobility and Min to CGA for seated balance EOB. Pt was limited by fatigue to progress to standing EOB this session. Daughter present and supportive. Due to pt current functional status, home set up and available assistance at home recommending skilled physical therapy services < 3 hours/day in order to address strength, balance and functional mobility to decrease risk for falls, injury, immobility, skin break down and re-hospitalization.      If plan is discharge home, recommend the following: Two people to help with walking and/or transfers;Two people to help with bathing/dressing/bathroom;Assistance with cooking/housework;Assist for transportation   Can travel by private vehicle     No  Equipment Recommendations  None recommended by PT       Precautions / Restrictions Precautions Precautions: Fall Recall of Precautions/Restrictions: Intact Precaution/Restrictions Comments: Watch BP (orthostatic in sitting 11/14) Restrictions Weight Bearing Restrictions Per Provider Order: No     Mobility  Bed Mobility Overal bed mobility: Needs Assistance Bed Mobility: Rolling, Sidelying to Sit, Sit to Sidelying Rolling: Max assist Sidelying to sit: Max assist     Sit to sidelying: Max assist General bed mobility comments: Assist for trunk and legs, pt with minimal initation for BLEs with heavy multi modal cueing    Transfers     General transfer  comment: pt declined due to fatigue.    Ambulation/Gait     General Gait Details: unable at this time due to fatigue/weakness.      Balance Overall balance assessment: Needs assistance Sitting-balance support: Bilateral upper extremity supported, Feet supported Sitting balance-Leahy Scale: Poor Sitting balance - Comments: Min A to CGA for sitting EOB Postural control: Posterior lean      Communication Communication Communication: No apparent difficulties  Cognition Arousal: Lethargic Behavior During Therapy: Flat affect   PT - Cognitive impairments: No apparent impairments     Following commands: Intact      Cueing Cueing Techniques: Verbal cues, Tactile cues     General Comments General comments (skin integrity, edema, etc.): VSS on 4L O2 via [place], daughter present during session.      Pertinent Vitals/Pain Pain Assessment Pain Assessment: Faces Faces Pain Scale: Hurts a little bit Pain Location: chronic low back Pain Descriptors / Indicators: Aching Pain Intervention(s): Monitored during session, Limited activity within patient's tolerance, Repositioned           PT Goals (current goals can now be found in the care plan section) Acute Rehab PT Goals Patient Stated Goal: to go home PT Goal Formulation: With patient/family Time For Goal Achievement: 06/08/24 Potential to Achieve Goals: Fair Progress towards PT goals: Not progressing toward goals - comment (slow progress toward previous goals due to decline)    Frequency    Min 2X/week      PT Plan  Continue with current POC        AM-PAC PT 6 Clicks Mobility   Outcome Measure  Help needed turning from your back to your side while in a flat bed without  using bedrails?: A Lot Help needed moving from lying on your back to sitting on the side of a flat bed without using bedrails?: A Lot Help needed moving to and from a bed to a chair (including a wheelchair)?: Total Help needed standing up from a  chair using your arms (e.g., wheelchair or bedside chair)?: Total Help needed to walk in hospital room?: Total Help needed climbing 3-5 steps with a railing? : Total 6 Click Score: 8    End of Session   Activity Tolerance: Patient limited by fatigue Patient left: in bed;with call bell/phone within reach;with bed alarm set;with nursing/sitter in room;with family/visitor present Nurse Communication: Mobility status PT Visit Diagnosis: Other abnormalities of gait and mobility (R26.89);Muscle weakness (generalized) (M62.81)     Time: 8853-8790 PT Time Calculation (min) (ACUTE ONLY): 23 min  Charges:    $Therapeutic Activity: 23-37 mins PT General Charges $$ ACUTE PT VISIT: 1 Visit                     Dorothyann Maier, DPT, CLT  Acute Rehabilitation Services Office: [phone] (Secure chat preferred)    Dorothyann VEAR Maier 06/05/2024, 3:10 PM

## 2024-06-05 NOTE — Progress Notes (Addendum)
 PHARMACY - ANTICOAGULATION CONSULT NOTE  Pharmacy Consult for heparin  infusion Indication: atrial fibrillation/hx of DVT  Allergies  Allergen Reactions   Neomycin-Bacitracin Zn-Polymyx Rash   Sulfa Antibiotics Diarrhea and Nausea And Vomiting   Ciprofloxacin  Hives, Itching and Rash    Pt doesn't remember having any reaction to cipro    Spironolactone  Rash    Patient Measurements: Height: 5' 6 (167.6 cm) Weight: 113.4 kg (250 lb) IBW/kg (Calculated) : 59.3 HEPARIN  DW (KG): 85.9  Vital Signs: Temp: 97.3 F (36.3 C) (11/20 0410) Temp Source: Axillary (11/20 0410) BP: 126/100 (11/20 0400) Pulse Rate: 80 (11/20 0400)  Labs: Recent Labs    06/03/24 1837 06/03/24 1855 06/03/24 1855 06/03/24 1959 06/04/24 0134 06/04/24 0456 06/04/24 1314 06/05/24 0222 06/05/24 0241 06/05/24 0420  HGB 14.3 15.3*  --   --   --  14.3  --  14.5  --   --   HCT 42.9 45.0  --   --   --  42.6  --  44.4  --   --   PLT 151  --   --   --   --  150  --  127*  --   --   APTT  --   --    < >  --   --  108* 58*  --  116* 172*  HEPARINUNFRC  --   --   --   --   --  >1.10* >1.10* >1.10*  --   --   CREATININE  --   --   --    < > 1.38* 1.35*  --  1.49*  --   --    < > = values in this interval not displayed.    Estimated Creatinine Clearance: 37.2 mL/min (A) (by C-G formula based on SCr of 1.49 mg/dL (H)).   Assessment: 83 yo F presenting for vomiting and diarrhea. Now undergoing RV lead revision. PMH significant for MASH, Afib, hx of DVT, thrombocytopenia, VT s/p ICD. On Eliquis  PTA for Afib and hx of DVT, has been receiving it inpatient as well. Last dose 06/02/24 at 2258. Pharmacy consulted for heparin  management.  Heparin  level remains elevated (>1.1) given recent DOAC, aPTT came back supra-therapeutic at 116 with re-draw now 172, after slight increase in heparin  from 900 units/hr to 950 units/hr heparin  infusion at 900 units/hr Per RN, has not has bowel movement this shift, CBC shows Hgb stable plts  150 > 127.   Goal of Therapy:  Heparin  level 0.3-0.5 units/ml aPTT 66-85 seconds Monitor platelets by anticoagulation protocol: Yes   Plan:  Decrease heparin  to 900 units/hr (given previously subtherapeutic at this rate) Order aPTT in 8 hours Monitor daily heparin  level and aPTT until correlating Monitor daily CBC and signs/symptoms of bleeding  Thank you for allowing pharmacy to participate in this patient's care,  Lynwood Poplar, PharmD, BCPS Clinical Pharmacist 06/05/2024 5:22 AM   ADDENDUM -per cards fellow will hold heparin  for now and f/u restart with dayteam  Lynwood Poplar, PharmD, BCPS Clinical Pharmacist 06/05/2024 5:31 AM

## 2024-06-05 NOTE — Progress Notes (Addendum)
 Patient ID: Heidi Stark, female   DOB: 02-Aug-1940, 83 y.o.   MRN: [id number]     Advanced Heart Failure Rounding Note  Cardiologist: Ezra Shuck, MD  Chief Complaint: CHF Subjective:    Off pressors. Continues on midodrine 10 mg TID. Co-ox 785. CVP 14.   Remains on abx, CT C/A/P suggestive of PNA. WBCs starting to come down.  More alert today. Concerned she is retaining fluid.    Echo showed EF 70-75%, mild RV dysfunction.   RHC: RHC Procedural Findings: Hemodynamics (mmHg) RA mean 13 RV 42/13 PA 42/27, mean 31 PCWP mean 16 Oxygen saturations: PA 60% AO 91% Cardiac Output (Fick) 4.78  Cardiac Index (Fick) 2.18 PVR 3.1 WU SVR 1205  PAPi 1.2    Objective:   Weight Range: 113.4 kg Body mass index is 40.35 kg/m.   Vital Signs:   Temp:  [97.3 F (36.3 C)-97.6 F (36.4 C)] 97.6 F (36.4 C) (11/20 0800) Pulse Rate:  [74-92] 82 (11/20 0902) Resp:  [10-27] 23 (11/20 0902) BP: (66-147)/(50-130) 103/75 (11/20 0902) SpO2:  [84 %-97 %] 94 % (11/20 0902) Arterial Line BP: (45-157)/(16-88) 130/64 (11/20 0902) Last BM Date : 06/04/24  Weight change: Filed Weights   05/22/24 2118  Weight: 113.4 kg    Intake/Output:   Intake/Output Summary (Last 24 hours) at 06/05/2024 1213 Last data filed at 06/05/2024 0900 Gross per 24 hour  Intake 647.29 ml  Output 950 ml  Net -302.71 ml      Physical Exam    General:  Chronically ill appearing elderly female.  Cor: JVP difficult d/t thick neck. Irregular rhythm. No murmurs. Lungs: coarse Abdomen: obese, soft, nontender, nondistended. Extremities: 1-2+ edema Neuro: alert & orientedx3. Affect pleasant    Telemetry   Afib vs flutter 80s  Labs    CBC Recent Labs    06/04/24 0456 06/05/24 0222  WBC 16.7* 15.4*  HGB 14.3 14.5  HCT 42.6 44.4  MCV 91.4 90.8  PLT 150 127*   Basic Metabolic Panel Recent Labs    88/81/74 1959 06/04/24 0134 06/04/24 0456 06/05/24 0222  NA 137 133* 134* 132*  K 3.3*  4.6 4.0 4.4  CL 100 97* 101 99  CO2 20* 20* 20* 21*  GLUCOSE 117* 150* 130* 116*  BUN 38* 39* 43* 53*  CREATININE 1.14* 1.38* 1.35* 1.49*  CALCIUM  6.6* 7.1* 7.0* 7.3*  MG 2.0 2.1  --   --    Liver Function Tests Recent Labs    06/03/24 0810 06/05/24 0222  AST 37 44*  ALT 23 31  ALKPHOS 59 61  BILITOT 0.8 0.9  PROT 5.1* 5.1*  ALBUMIN 2.0* 1.9*   No results for input(s): LIPASE, AMYLASE in the last 72 hours. Cardiac Enzymes No results for input(s): CKTOTAL, CKMB, CKMBINDEX, TROPONINI in the last 72 hours.  BNP: BNP (last 3 results) Recent Labs    10/11/23 1548 02/29/24 1747  BNP 166.6* 240.0*    ProBNP (last 3 results) No results for input(s): PROBNP in the last 8760 hours.   D-Dimer No results for input(s): DDIMER in the last 72 hours. Hemoglobin A1C No results for input(s): HGBA1C in the last 72 hours. Fasting Lipid Panel No results for input(s): CHOL, HDL, LDLCALC, TRIG, CHOLHDL, LDLDIRECT in the last 72 hours. Thyroid  Function Tests No results for input(s): TSH, T4TOTAL, T3FREE, THYROIDAB in the last 72 hours.  Invalid input(s): FREET3  Other results:   Imaging    CT CHEST ABDOMEN PELVIS W CONTRAST Result Date:  06/04/2024 EXAM: CT CHEST ABDOMEN PELVIS WITH THORACIC AND LUMBAR SPINE RECONSTRUCTIONS 06/04/2024 12:42:39 PM TECHNIQUE: CT of the chest, abdomen, pelvis was performed after the administration of 75 mL of intravenous iohexol  (OMNIPAQUE ) 350 MG/ML injection. Multiplanar reformatted images are provided for review, including reconstructed images of the thoracic and lumbar spine. Automated exposure control, iterative reconstruction, and/or weight based adjustment of the mA/kV was utilized to reduce the radiation dose to as low as reasonably achievable. COMPARISON: CT 05/21/2024 CLINICAL HISTORY: Sepsis. FINDINGS: CT CHEST: THORACIC AORTA: No acute traumatic injury of the aorta. MEDIASTINUM: No mediastinal hematoma  or pneumomediastinum. No acute traumatic injury to the heart or pericardium. The central airways are clear. PICC line in place. LUNGS: Confluent airspace disease in the right upper lobe. Bibasilar atelectasis and pleural effusions. No acute traumatic injury to the lungs. No pulmonary contusion or laceration. CHEST WALL: No acute displaced rib fracture. No chest wall hematoma. CT ABDOMEN AND PELVIS: ABDOMINAL AORTA: Atherosclerotic calcification of the aorta. No acute traumatic injury of the aorta or iliac arteries. HEPATOBILIARY: The gallbladder is distended to 5.8 cm with a single small gallstone but no additional evidence of acute cholecystitis. No bile ducts normal. SPLEEN: No acute traumatic injury. PANCREAS: No acute traumatic injury. ADRENAL GLANDS: Bilateral enlargement of the adrenal glands suggesting hyperplasia. KIDNEYS: There is a small indeterminate lesion of the right kidney measuring 14 mm which is favored to be a cyst but cannot be fully characterized due to streak artifact and small size. Increased from 9 mm on CT 12/03/2014. Kidneys, ureters, and bladder are otherwise normal. No hydronephrosis. GI TRACT: No acute traumatic injury of the bowel. No bowel obstruction. No bowel infection, inflammation, or obstruction. Normal appendix. PERITONEUM: No ascites or free air. RETROPERITONEUM: No retroperitoneal hematoma. BLADDER: No acute abnormality. REPRODUCTIVE ORGANS: No acute abnormality. BONES: No acute traumatic fracture of the pelvis. No aggressive osseous lesion. THORACIC AND LUMBAR SPINE: BONES AND ALIGNMENT: No traumatic fracture or traumatic malalignment. DEGENERATIVE CHANGES: No severe spinal canal stenosis or bony neural foraminal narrowing. SOFT TISSUES: No paraspinal mass or hematoma. IMPRESSION: 1. Right upper lobe airspace disease, suggestive of pneumonia. 2. Bilateral pleural effusions with basilar atelectasis. 3. Distended gallbladder with a single small gallstone, without additional  evidence of acute cholecystitis. 4. Small indeterminate enlarging 14 mm right renal lesion; recommend renal protocol MRI or CT without and with contrast for further characterization. Electronically signed by: Norleen Boxer MD 06/04/2024 05:18 PM EST RP Workstation: [id number]     Medications:     Scheduled Medications:  amiodarone   200 mg Oral Daily   bethanechol  10 mg Oral TID   Chlorhexidine  Gluconate Cloth  6 each Topical Daily   colestipol  2 g Oral BID   feeding supplement  1 Container Oral TID BM   gabapentin   100 mg Oral BID   Gerhardt's butt cream   Topical BID   insulin  aspart  0-5 Units Subcutaneous QHS   insulin  aspart  0-9 Units Subcutaneous TID WC   levothyroxine   25 mcg Oral Daily   LORazepam   0.25 mg Oral QHS   metoCLOPramide (REGLAN) injection  10 mg Intravenous Q8H   mexiletine  300 mg Oral BID   midodrine  10 mg Oral TID WC   nystatin    Topical BID   pantoprazole (PROTONIX) IV  40 mg Intravenous Q12H   saccharomyces boulardii  250 mg Oral BID   sodium chloride  flush  10-40 mL Intracatheter Q12H    Infusions:  ceFEPime  (MAXIPIME ) IV 2  g (06/05/24 1022)   heparin  900 Units/hr (06/05/24 0900)   norepinephrine  (LEVOPHED ) Adult infusion Stopped (06/04/24 1742)   vancomycin       PRN Medications: acetaminophen , guaiFENesin -dextromethorphan, ondansetron  (ZOFRAN ) IV, mouth rinse, sodium chloride  flush   Assessment/Plan   1. Shock: SBP 80s at times with lactate up to 2.9 on 11/18, BP hard to obtain by cuff.  Echo showed EF 70-75% with mild RV dysfunction, RHC with mild-moderate RV dysfunction.  Now suspect most likely septic shock from PNA, CT also suggestive of PNA. There was evidence of distended gallbladder w/ stone but no other evidence of cholecysitis. - She is off pressors. Wean midodrine  to 5 mg TID - Broad spectrum abx, vancomycin /cefepime  per CCM 2. 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 - CVP up to 14. Diuresis not very robust yesterday with 80 mg IV lasix . Increase to 80 mg lasix  BID. May need metolazone . Place foley prior to diuresing. Has been retaining urine, requiring In and Out cath X 3. 3. 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 Had been on Tikosyn  but this was not the typical Tikosyn -induced polymorphic VT and she had had no changes in Tikosyn  dosing prior to occurrence. cMRI showed subendocardial LGE in the basal to mid inferolateral wall. This appeared to be a coronary disease pattern however coronary angiogram did not show significant coronary disease. She was transitioned from Tikosyn  to amiodarone  and got [employer] single chamber ICD on 09/15/20. VT on 2/23 and was started on mexiletine. VT again on 5/23 after missing amiodarone  and mexiletine. VT again prior to this admission (had been off her mexiletine and amiodarone  due to nauea/vomiting).  - Continue amiodarone  200 mg daily. Check TSH and LFTs today.  - Continue mexiletine 300 mg bid. - needs RV lead revision, EP will delay lead revision based on how sick she is, needs to stabilize and then get this done (was supposed to be 11/20).  4. Atrial fibrillation/flutter: Persistent. 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  She had atrial fibrillation ablation in 4/18.  In 5/19, she went into atrial fibrillation transiently. In 10/19, she was started on Tikosyn  and DCCV.  Now off Tikosyn  and on amiodarone .  Multiple DCCVs.  She is now back in atrial fibrillation/flutter with controlled rate.  - Heparin  gtt for now.  - Could consider TEE-guided DCCV before discharge.  5. PAD: Left leg claudication with absent left PT pulse.  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 CTA in 5/24 showed right anterior tibial and peroneal  arteries occluded, left CIA/EIA/internal iliac artery occluded with reconstitution of SFA.  - Follows w [facility] (Dr. Sheree).  6. ID: Initially presented with diarrhea/nausea/vomiting.  This has resolved.  However, now with concern for septic shock, high suspicion this was d/t PNA. CT also suggestive of PNA. Lactate elevated on 11/18.  PCT not markedly elevated at 0.55.  BC X 2 NG. - Broad spectrum abx with cefepime /vancomycin .  7. GI: Profuse diarrhea when she initially arrived in hospital.  Now with hematochezia.  Hgb remains stable 14.5. - GI has seen. GI felt n/v/d may have been d/t mounjaro. No clear source of bleeding on CT   Needs aggressive PT/OT  CRITICAL CARE Performed by: COLLETTA SHAVER N   Total critical  care time: 15 minutes  Critical care time was exclusive of separately billable procedures and treating other patients.  Critical care was necessary to treat or prevent imminent or life-threatening deterioration.  Critical care was time spent personally by me on the following activities: development of treatment plan with patient and/or surrogate as well as nursing, discussions with consultants, evaluation of patient's response to treatment, examination of patient, obtaining history from patient or surrogate, ordering and performing treatments and interventions, ordering and review of laboratory studies, ordering and review of radiographic studies, pulse oximetry and re-evaluation of patient's condition.   FINCH, LINDSAY N, PA-C  06/05/2024, 12:13 PM  Advanced Heart Failure Team Pager [phone] (M-F; 7a - 5p)  Please contact [facility] for night-coverage after hours (5p -7a ) and weekends on [email or web address]   Patient seen with PA, I formulated the plan and agree with the above note.   CT chest yesterday showed RUL PNA.  She is coughing today, remains on oxygen 4L by nasal cannula. She is on cefepime /vancomycin .   CVP 11-12, co-ox 78%.  Creatinine 1.35 => 1.49.   General:  NAD Neck: JVP 10-12, no thyromegaly or thyroid  nodule.  Lungs: Rhonchi bilaterally.  CV: Nondisplaced PMI.  Heart irregular S1/S2, no S3/S4, no murmur.  No peripheral edema.   Abdomen: Soft, nontender, no hepatosplenomegaly, no distention.  Skin: Intact without lesions or rashes.  Neurologic: Alert and oriented x 3.  Psych: Normal affect. Extremities: No clubbing or cyanosis.  HEENT: Normal.   BP now elevated, can stop midodrine .   Ongoing treatment of PNA, on broad spectrum abx.   CVP elevated.  Will give Lasix  80 mg IV bid today.  Watch creatinine closely, has been rising.   Rate-controlled AF, would like to eventually cardiovert.   Will need RV lead revision when she is more stable.   CRITICAL CARE Performed by: Ezra Shuck  Total critical care time: 35 minutes  Critical care time was exclusive of separately billable procedures and treating other patients.  Critical care was necessary to treat or prevent imminent or life-threatening deterioration.  Critical care was time spent personally by me on the following activities: development of treatment plan with patient and/or surrogate as well as nursing, discussions with consultants, evaluation of patient's response to treatment, examination of patient, obtaining history from patient or surrogate, ordering and performing treatments and interventions, ordering and review of laboratory studies, ordering and review of radiographic studies, pulse oximetry and re-evaluation of patient's condition.  Ezra Shuck 06/05/2024 1:05 PM

## 2024-06-05 NOTE — Progress Notes (Addendum)
 Pharmacy Antibiotic Note  Heidi Stark is a 83 y.o. female admitted on 05/22/2024 with sepsis.  Pharmacy has been consulted for vancomycin  and cefepime  dosing.  On day #3. Scr up to 1.49 (CrCl 37 mL/min). Afebrile. LA 1.8. PCT 0.55. CT ab/pelvis finding RUL suggestive PNA - MRSA PCR positive.  Plan: Continue cefepime  2g IV q12h Reduce vancomycin  1500mg  IV q48h given Scr up Follow SCr, BCx, LOT Vancomycin  levels as needed  Height: 5' 6 (167.6 cm) Weight: 113.4 kg (250 lb) IBW/kg (Calculated) : 59.3  Temp (24hrs), Avg:97.5 F (36.4 C), Min:97.3 F (36.3 C), Max:97.6 F (36.4 C)  Recent Labs  Lab 06/02/24 0416 06/03/24 0554 06/03/24 1018 06/03/24 1316 06/03/24 1436 06/03/24 1837 06/03/24 1850 06/03/24 1959 06/04/24 0134 06/04/24 0456 06/04/24 0457 06/05/24 0222  WBC 7.9 14.4*  --   --   --  14.6*  --   --   --  16.7*  --  15.4*  CREATININE 0.80 1.21*  --   --   --   --   --  1.14* 1.38* 1.35*  --  1.49*  LATICACIDVEN  --   --  2.9* 2.3* 2.5*  --  1.8  --   --   --  1.8  --     Estimated Creatinine Clearance: 37.2 mL/min (A) (by C-G formula based on SCr of 1.49 mg/dL (H)).    Allergies  Allergen Reactions   Neomycin-Bacitracin Zn-Polymyx Rash   Sulfa Antibiotics Diarrhea and Nausea And Vomiting   Ciprofloxacin  Hives, Itching and Rash    Pt doesn't remember having any reaction to cipro    Spironolactone  Rash    Antimicrobials this admission: Ceftriaxone  11/11 >> /11/13 Cefepime  11/18 >>  Vancomycin  11/18 >>  Dose adjustments this admission: N/A  Microbiology results: Bcx 11/18: ngtd  MRSA PCR 11/10: positive  GI panel 11/7: neg C diff 11/7: neg   Thank you for allowing pharmacy to participate in this patient's care,  Suzen Sour, PharmD, BCCCP Clinical Pharmacist  Phone: [phone] 06/05/2024 12:18 PM  Please check AMION for all [facility] phone numbers After 10:00 PM, call Main Pharmacy [phone]

## 2024-06-05 NOTE — Progress Notes (Signed)
 PHARMACY - ANTICOAGULATION CONSULT NOTE  Pharmacy Consult for heparin  infusion Indication: atrial fibrillation/hx of DVT  Allergies  Allergen Reactions   Neomycin-Bacitracin Zn-Polymyx Rash   Sulfa Antibiotics Diarrhea and Nausea And Vomiting   Ciprofloxacin  Hives, Itching and Rash    Pt doesn't remember having any reaction to cipro    Spironolactone  Rash    Patient Measurements: Height: 5' 6 (167.6 cm) Weight: 113.4 kg (250 lb) IBW/kg (Calculated) : 59.3 HEPARIN  DW (KG): 85.9  Vital Signs: Temp: 97.6 F (36.4 C) (11/20 0800) Temp Source: Oral (11/20 0800) BP: 146/89 (11/20 1600) Pulse Rate: 82 (11/20 1600)  Labs: Recent Labs    06/03/24 1837 06/03/24 1855 06/03/24 1855 06/03/24 1959 06/04/24 0134 06/04/24 0456 06/04/24 1314 06/05/24 0222 06/05/24 0241 06/05/24 0420 06/05/24 1443 06/05/24 1911  HGB 14.3 15.3*  --   --   --  14.3  --  14.5  --   --   --   --   HCT 42.9 45.0  --   --   --  42.6  --  44.4  --   --   --   --   PLT 151  --   --   --   --  150  --  127*  --   --   --   --   APTT  --   --    < >  --   --  108* 58*  --    < > 172* 198* 42*  HEPARINUNFRC  --   --   --   --   --  >1.10* >1.10* >1.10*  --   --   --   --   CREATININE  --   --   --    < > 1.38* 1.35*  --  1.49*  --   --   --   --    < > = values in this interval not displayed.    Estimated Creatinine Clearance: 37.2 mL/min (A) (by C-G formula based on SCr of 1.49 mg/dL (H)).   Assessment: 83 yo F presenting for vomiting and diarrhea. Now undergoing RV lead revision. PMH significant for MASH, Afib, hx of DVT, thrombocytopenia, VT s/p ICD. On Eliquis  PTA for Afib and hx of DVT, has been receiving it inpatient as well. Last dose 06/02/24 at 2258. Pharmacy consulted for heparin  management.  Heparin  level remains elevated (>1.1) given recent DOAC, aPTT came back supra-therapeutic at 116 with re-draw now 172, after slight increase in heparin  from 900 units/hr to 950 units/hr heparin  infusion at  900 units/hr Per RN, has not has bowel movement this shift, CBC shows Hgb stable plts 150 > 127.   aPTT came back at 172, on 900 units/hr. Confirmed with RN drawn from PICC but heparin  is running peripherally. No s/sx of bleeding or infusion issues.   PM update: aPTT has now decreased to 42 sec with heparin  on hold for ~2hr.  Goal of Therapy:  Heparin  level 0.3-0.5 units/ml aPTT 66-85 seconds Monitor platelets by anticoagulation protocol: Yes   Plan:  Restart heparin  infusion at 800 units/hr Check aPTT and HL in 8hrs Monitor daily heparin  level and aPTT until correlating Monitor daily CBC and signs/symptoms of bleeding  Thank you for allowing pharmacy to participate in this patient's care,  Rocky Slade, PharmD, BCPS Clinical Pharmacist  Phone: [phone] 06/05/2024 7:49 PM  Please check AMION for all [facility] Pharmacy phone numbers After 10:00 PM, call Main Pharmacy [phone]

## 2024-06-05 NOTE — Progress Notes (Signed)
   06/05/24 2313  BiPAP/CPAP/SIPAP  BiPAP/CPAP/SIPAP Pt Type Adult  BiPAP/CPAP/SIPAP Resmed  Mask Type Full face mask  Mask Size Medium  EPAP 8 cmH2O  Flow Rate 6 lpm  Patient Home Machine No  Patient Home Mask No  Patient Home Tubing No  Auto Titrate No  Device Plugged into RED Power Outlet Yes  BiPAP/CPAP /SiPAP Vitals  SpO2 93 %  Bilateral Breath Sounds Rhonchi;Diminished

## 2024-06-05 NOTE — Progress Notes (Signed)
 PHARMACY - ANTICOAGULATION CONSULT NOTE  Pharmacy Consult for heparin  infusion Indication: atrial fibrillation/hx of DVT  Allergies  Allergen Reactions   Neomycin-Bacitracin Zn-Polymyx Rash   Sulfa Antibiotics Diarrhea and Nausea And Vomiting   Ciprofloxacin  Hives, Itching and Rash    Pt doesn't remember having any reaction to cipro    Spironolactone  Rash    Patient Measurements: Height: 5' 6 (167.6 cm) Weight: 113.4 kg (250 lb) IBW/kg (Calculated) : 59.3 HEPARIN  DW (KG): 85.9  Vital Signs: Temp: 97.6 F (36.4 C) (11/20 0800) Temp Source: Oral (11/20 0800) BP: 103/75 (11/20 0902) Pulse Rate: 112 (11/20 1200)  Labs: Recent Labs    06/03/24 1837 06/03/24 1855 06/03/24 1855 06/03/24 1959 06/04/24 0134 06/04/24 0456 06/04/24 1314 06/05/24 0222 06/05/24 0241 06/05/24 0420  HGB 14.3 15.3*  --   --   --  14.3  --  14.5  --   --   HCT 42.9 45.0  --   --   --  42.6  --  44.4  --   --   PLT 151  --   --   --   --  150  --  127*  --   --   APTT  --   --    < >  --   --  108* 58*  --  116* 172*  HEPARINUNFRC  --   --   --   --   --  >1.10* >1.10* >1.10*  --   --   CREATININE  --   --   --    < > 1.38* 1.35*  --  1.49*  --   --    < > = values in this interval not displayed.    Estimated Creatinine Clearance: 37.2 mL/min (A) (by C-G formula based on SCr of 1.49 mg/dL (H)).   Assessment: 83 yo F presenting for vomiting and diarrhea. Now undergoing RV lead revision. PMH significant for MASH, Afib, hx of DVT, thrombocytopenia, VT s/p ICD. On Eliquis  PTA for Afib and hx of DVT, has been receiving it inpatient as well. Last dose 06/02/24 at 2258. Pharmacy consulted for heparin  management.  Heparin  level remains elevated (>1.1) given recent DOAC, aPTT came back supra-therapeutic at 116 with re-draw now 172, after slight increase in heparin  from 900 units/hr to 950 units/hr heparin  infusion at 900 units/hr Per RN, has not has bowel movement this shift, CBC shows Hgb stable plts 150  > 127.   aPTT came back at 172, on 900 units/hr. Confirmed with RN drawn from PICC but heparin  is running peripherally. No s/sx of bleeding or infusion issues.   Goal of Therapy:  Heparin  level 0.3-0.5 units/ml aPTT 66-85 seconds Monitor platelets by anticoagulation protocol: Yes   Plan:  Will hold heparin  infusion for 2 hours and recheck aPTT prior to restart Monitor daily heparin  level and aPTT until correlating Monitor daily CBC and signs/symptoms of bleeding  Thank you for allowing pharmacy to participate in this patient's care,  Suzen Sour, PharmD, BCCCP Clinical Pharmacist  Phone: [phone] 06/05/2024 4:39 PM  Please check AMION for all [facility] phone numbers After 10:00 PM, call Main Pharmacy [phone]

## 2024-06-05 NOTE — Telephone Encounter (Signed)
 Copied from CRM [id number]. Topic: Medicare AWV >> Jun 05, 2024 10:56 AM Nathanel DEL wrote: Called LVM 06/05/2024 to sched AWV. Please schedule in office or virtual visit.   Nathanel Paschal; Care Guide Ambulatory Clinical Support [facility] Direct Dial: [phone]

## 2024-06-05 NOTE — Plan of Care (Signed)
  Problem: Fluid Volume: Goal: Ability to maintain a balanced intake and output will improve Outcome: Not Progressing   Problem: Nutritional: Goal: Maintenance of adequate nutrition will improve Outcome: Not Progressing Goal: Progress toward achieving an optimal weight will improve Outcome: Not Progressing   Problem: Skin Integrity: Goal: Risk for impaired skin integrity will decrease Outcome: Not Progressing   Problem: Clinical Measurements: Goal: Respiratory complications will improve Outcome: Not Progressing

## 2024-06-05 NOTE — Consult Note (Signed)
 NAME:  Heidi Stark, MRN:  [id number], DOB:  July 15, 1941, LOS: 13 ADMISSION DATE:  05/22/2024, CONSULTATION DATE:  11/18 REFERRING MD:  Vergil, CHIEF COMPLAINT:  cardiogenic shock   History of Present Illness:  83 year old female w/multiple co-morbids (see below) Initially admitted 11/6 w/GI illness with N/V and Diarrhea. Was weak not able to take POs. Admitted to hospital team. GI consulted. Was dehydrated and hypotensive. Several of cardiac meds either not taken or held. Was re-hydrated, diarrhea subsided, course then complicated by recurrent VT.  She was seen by EP as well as advanced HF.  On 11/18 more hypotensive in spite of midodrine  10mg  tid. More lethargic, Mental status declined, her extremities were cool, she had leukocytosis and lactic acid of 2.9. ECHO reported as RV dilated w/ reduced fxn. Because of sudden clinical decline she was brought to the cardiac cath lab for right heart catheterization and subsequently transferred to the ICU post cath PCCM asked to assist w/ care Echo 11/18  EF 70-75% w/ hyperdynamic LVF  RV systolic fxn mildly reduced and mildly enlarged. 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 PAD, CAD, OSA on CPAP CKD stage III, hypothyroidism. EP lead malfxn Significant Hospital Events: Including procedures, antibiotic start and stop dates in addition to other pertinent events   11/6 admitted w. Diarrhea illness 11/7 Cards and EP consulted for freq VT 11/11 GI consulted for diarrhea 11/17 Orthostatic hypotension started back on IVFs got IV fluid boluses Echo 11/18 EF 70-75% w/ hyperdynamic LVF  RV systolic fxn mildly reduced and mildly enlarged. LA  dilated 11/18 more lethargic. BP still low  11/19 on and off levo overnight, patient has axillary A-line was placed yesterday, having blood-tinged type V to type VI stools 11/20 Less lethargic, less confused, off levophed    Interim History / Subjective:  Off pressors  Less confused/lethargic   No significant events overnight   Objective    Blood pressure 103/75, pulse 82, temperature 97.6 F (36.4 C), temperature source Oral, resp. rate (!) 23, height 5' 6 (1.676 m), weight 113.4 kg, SpO2 94%. CVP:  [4 mmHg-19 mmHg] 19 mmHg      Intake/Output Summary (Last 24 hours) at 06/05/2024 1400 Last data filed at 06/05/2024 0900 Gross per 24 hour  Intake 618.41 ml  Output 500 ml  Net 118.41 ml   Filed Weights   05/22/24 2118  Weight: 113.4 kg    Examination: General: acute on chronically ill older adult female, lying in ICU bed, in NAD HEENT: Normocephalic, Missing teeth, poor dentition  CV: s1,s2, irregular-afib ICD in place-  pulm: rhonchi, diminished, 4 L [place]  Abs: BS active, soft  Extremities: generalized edema, moves all extremities  Skin: generalized bruising, MSAD, DTIs see image  Neuro: RASS 0, disoriented to time, oriented x 3  GU: urinary retention           Resolved problem list  UTI treated 3ctx  Assessment and Plan   Suspect Septic Shock w/lactic acidosis superimposed on chronic systolic HF (HFpEF)- Multi lobar pneumonia  + SIRS,echo hyperdynamic, RHC: shows RV dysfxn supported by papi of 1.2, index of 2.18 SVR 1205 [place] 78.3 CVP  Creatinine 1.49  WBC 7.9>14.4>14.6>16.7, lactate cleared  11/19 CT-Chest/abdomen/Pelvis  Right upper lobe airspace disease, Bilateral pleural effusions with basilar atelectasis. Distended gallbladder with a single small gallstone, without additional evidence of acute cholecystitis. P: Continue axillary aline- hopefully will able to DC this afternoon Continue MAP goal >65, has been off levophed Continue vanc and cefepime    Heart failure team following appreciate assistance, plan for diuresis later today  Continue to trend WBC and fever curve   Acute hypoxic resp failure w/ right sided airspace disease in setting of Multilobar Pneumonia, with asymmetric pulmonary edema OSA hx  COPD hx  Has been receiving diuresis, still + 3.6Liters  P:  Continue adequate and aggressive pulm hygiene  Continue IS, continue deep breathing exercises, mobilize with PT/OT Continue to wean O2 as tolerated, currently on 4L [place], O2 sats >90%  Will benefit from diuresis today Continue to utilize CPAP at night  Add flutter valve   Acute on chronic renal failure CKD IIIb w/urinary retention  -FC placed 11/15 has since been removed Still having on going retention issues - Cr bump from contrast and diuresis  P: Continue to trend renal function daily  Continue to monitor and optimize electrolytes daily Continue to monitor urine output Continue strict I/Os Continue Adequate renal perfusion  Avoid nephrotoxic agents  Place foley, since patient still requiring frequent in/out   Monomorphic VT-resolved  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 May need to re-engage in next couple days, as patient slowly making some progress   Persistent AF P: Continue heparin  gtt for now  No evidence of blood stools overnight Continue to monitor  Acute metabolic encephalopathy  11/18 New today ? Infection vs could also be hypercarbia Suspect this is multifactorial, but concerned for infection pna vs GI source?  Mentation improving, patient less lethargic today P: Continue CPAP overnight Continue delirium precautions Transfer out of ICU later today, if remains hemodynamically stable   Heme positive stool -did  have sig diarrhea  Did not have any stool output overnight Hgb stable P: CT abdomen/pelvis with contrast- did not show any signs of bleeding, distended gall bladder but not concerning for acute cholecystitis   Appreciate GI consult, and assistance   Diarrheal illness Improved. 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   Distended gall bladder with stone AST improving, total bili 1.0 P: Follow up with MASH/liver cirrhosis  Continue to trend LFTs   Sacral wounds w/ Moisture associated skin injury See image above P: Continue to utilize air mattress Mobilize Continue q2 turns PT/OT mobilize daily, up to chair daily  Wound care following appreciate assistance, follow recs   Labs   CBC: Recent Labs  Lab 05/30/24 0412 05/31/24 0455 06/01/24 0556 06/02/24 0416 06/03/24 0554 06/03/24 1611 06/03/24 1837 06/03/24 1855 06/04/24 0456 06/05/24 0222  WBC 8.2 10.8* 7.9 7.9 14.4*  --  14.6*  --  16.7* 15.4*  NEUTROABS 4.6 5.7 4.1  --   --   --   --   --   --   --  HGB 14.9 15.5* 14.9 14.3 14.5 16.3*  16.0* 14.3 15.3* 14.3 14.5  HCT 46.2* 48.8* 44.5 42.9 43.0 48.0*  47.0* 42.9 45.0 42.6 44.4  MCV 93.3 94.9 90.6 91.1 90.1  --  90.9  --  91.4 90.8  PLT 102* 121* 124* 123* 142*  --  151  --  150 127*    Basic Metabolic Panel: Recent Labs  Lab 06/03/24 0554 06/03/24 1611 06/03/24 1855 06/03/24 1959 06/04/24 0134 06/04/24 0456 06/05/24 0222  NA 132*   < > 133* 137 133* 134* 132*  K 3.6   < > 3.4* 3.3* 4.6 4.0 4.4  CL 97*  --   --  100 97* 101 99  CO2 23  --   --  20* 20* 20* 21*  GLUCOSE 127*  --   --  117* 150* 130* 116*  BUN 27*  --   --  38* 39* 43* 53*  CREATININE 1.21*  --   --  1.14* 1.38* 1.35* 1.49*  CALCIUM  7.0*  --   --  6.6* 7.1* 7.0* 7.3*  MG  --   --   --   2.0 2.1  --   --    < > = values in this interval not displayed.   GFR: Estimated Creatinine Clearance: 37.2 mL/min (A) (by C-G formula based on SCr of 1.49 mg/dL (H)). Recent Labs  Lab 06/03/24 0554 06/03/24 1018 06/03/24 1316 06/03/24 1436 06/03/24 1837 06/03/24 1850 06/04/24 0456 06/04/24 0457 06/05/24 0222  PROCALCITON  --   --   --   --  0.55  --   --   --   --   WBC 14.4*  --   --   --  14.6*  --  16.7*  --  15.4*  LATICACIDVEN  --    < > 2.3* 2.5*  --  1.8  --  1.8  --    < > = values in this interval not displayed.    Liver Function Tests: Recent Labs  Lab 06/03/24 0810 06/05/24 0222  AST 37 44*  ALT 23 31  ALKPHOS 59 61  BILITOT 0.8 0.9  PROT 5.1* 5.1*  ALBUMIN 2.0* 1.9*   No results for input(s): LIPASE, AMYLASE in the last 168 hours. No results for input(s): AMMONIA in the last 168 hours.  ABG    Component Value Date/Time   PHART 7.338 (L) 06/03/2024 1855   PCO2ART 45.4 06/03/2024 1855   PO2ART 83 06/03/2024 1855   HCO3 24.2 06/03/2024 1855   TCO2 26 06/03/2024 1855   ACIDBASEDEF 2.0 06/03/2024 1855   O2SAT 78.3 06/05/2024 0537     Coagulation Profile: No results for input(s): INR, PROTIME in the last 168 hours.  Cardiac Enzymes: No results for input(s): CKTOTAL, CKMB, CKMBINDEX, TROPONINI in the last 168 hours.  HbA1C: Hemoglobin A1C  Date/Time Value Ref Range Status  12/21/2021 12:00 AM 6.0  Final    Comment:    Dr Becki office  10/26/2020 12:00 AM 8.9  Final   Hgb A1c MFr Bld  Date/Time Value Ref Range Status  01/30/2024 02:48 PM 6.2 4.6 - 6.5 % Final    Comment:    Glycemic Control Guidelines for People with Diabetes:Non Diabetic:  <6%Goal of Therapy: <7%Additional Action Suggested:  >8%   09/14/2023 03:28 PM 6.5 (H) <5.7 % of total Hgb Final    Comment:    For someone without known diabetes, a hemoglobin A1c value of 6.5% or greater indicates that they may  have  diabetes and this should be confirmed with a  follow-up  test. . For someone with known diabetes, a value <7% indicates  that their diabetes is well controlled and a value  greater than or equal to 7% indicates suboptimal  control. A1c targets should be individualized based on  duration of diabetes, age, comorbid conditions, and  other considerations. . Currently, no consensus exists regarding use of hemoglobin A1c for diagnosis of diabetes for children. .     CBG: Recent Labs  Lab 06/04/24 0617 06/04/24 1324 06/04/24 1619 06/05/24 0627 06/05/24 1147  GLUCAP 132* 134* 129* 135* 157*    Review of Systems:   No sob or cp  Past Medical History:  She,  has a past medical history of Arthritis, CHF (congestive heart failure) (HCC), Chronic lower back pain, Constipation, DVT (deep venous thrombosis) (HCC), History of blood transfusion (1990), History of kidney stones, Hyperlipidemia, Hypertension, Joint pain, Lower extremity edema, OSA on CPAP, Persistent atrial fibrillation (HCC), Pneumonia, and Type II diabetes mellitus (HCC).   Surgical History:   Past Surgical History:  Procedure Laterality Date   ATRIAL FIBRILLATION ABLATION N/A 11/07/2016   Procedure: Atrial Fibrillation Ablation;  Surgeon: Will Gladis Norton, MD;  Location: [facility] INVASIVE CV LAB;  Service: Cardiovascular;  Laterality: N/A;   BACK SURGERY     CARDIAC CATHETERIZATION     CARDIOVERSION N/A 08/11/2016   Procedure: CARDIOVERSION;  Surgeon: Ezra GORMAN Shuck, MD;  Location: [facility] ENDOSCOPY;  Service: Cardiovascular;  Laterality: N/A;   CARDIOVERSION N/A 12/03/2017   Procedure: CARDIOVERSION;  Surgeon: Shuck Ezra GORMAN, MD;  Location: [facility] ENDOSCOPY;  Service: Cardiovascular;  Laterality: N/A;   CARDIOVERSION N/A 05/02/2018   Procedure: CARDIOVERSION;  Surgeon: Mona Vinie BROCKS, MD;  Location: [facility] ENDOSCOPY;  Service: Cardiovascular;  Laterality: N/A;   CARDIOVERSION N/A 06/23/2020   Procedure: CARDIOVERSION;  Surgeon: Hobart Powell BRAVO, MD;  Location: [facility]  ENDOSCOPY;  Service: Cardiovascular;  Laterality: N/A;   CARDIOVERSION N/A 10/11/2020   Procedure: CARDIOVERSION;  Surgeon: Shuck Ezra GORMAN, MD;  Location: [facility] ENDOSCOPY;  Service: Cardiovascular;  Laterality: N/A;   CARPAL TUNNEL RELEASE     CATARACT EXTRACTION W/ INTRAOCULAR LENS  IMPLANT, BILATERAL Bilateral    COLON SURGERY  1990   vein graft and colon repair after nicked artery with back surgert   COLONOSCOPY     CORONARY ANGIOGRAPHY N/A 09/08/2020   Procedure: CORONARY ANGIOGRAPHY;  Surgeon: Shuck Ezra GORMAN, MD;  Location: [facility] INVASIVE CV LAB;  Service: Cardiovascular;  Laterality: N/A;   CYSTECTOMY     between bladder and kidneys   GANGLION CYST EXCISION Left    ICD IMPLANT N/A 09/15/2020   Procedure: ICD IMPLANT;  Surgeon: Norton Soyla Gladis, MD;  Location: [facility] INVASIVE CV LAB;  Service: Cardiovascular;  Laterality: N/A;   KNEE CARTILAGE SURGERY Right 1960s   LUMBAR DISC SURGERY  1990   REPAIR ILIAC ARTERY  1990   vein graft and colon repair after nicked artery with back surgert   RIGHT HEART CATH N/A 06/03/2024   Procedure: RIGHT HEART CATH;  Surgeon: Shuck Ezra GORMAN, MD;  Location: [facility] INVASIVE CV LAB;  Service: Cardiovascular;  Laterality: N/A;   TEE WITHOUT CARDIOVERSION N/A 10/31/2013   Procedure: TRANSESOPHAGEAL ECHOCARDIOGRAM (TEE);  Surgeon: Ezra GORMAN Shuck, MD;  Location: [facility] ENDOSCOPY;  Service: Cardiovascular;  Laterality: N/A;   TUBAL LIGATION       Social History:   reports that she quit smoking about 3 years ago. Her smoking use included cigarettes.  She started smoking about 53 years ago. She has a 50 pack-year smoking history. She has never used smokeless tobacco. She reports that she does not drink alcohol and does not use drugs.   Family History:  Her family history includes Cancer in her mother; Diabetes in her father, mother, and another family member; Factor V Leiden deficiency in her daughter; Hypertension in her mother; Melanoma in an other family member;  Obesity in her father and mother.   Allergies Allergies  Allergen Reactions   Neomycin-Bacitracin Zn-Polymyx Rash   Sulfa Antibiotics Diarrhea and Nausea And Vomiting   Ciprofloxacin  Hives, Itching and Rash    Pt doesn't remember having any reaction to cipro    Spironolactone  Rash     Home Medications  Prior to Admission medications   Medication Sig Start Date End Date Taking? Authorizing Provider  amiodarone  (PACERONE ) 200 MG tablet TAKE 1 TABLET BY MOUTH EVERY DAY 08/23/23  Yes Rolan Ezra RAMAN, MD  apixaban  (ELIQUIS ) 5 MG TABS tablet TAKE 1 TABLET BY MOUTH TWICE A DAY 03/06/24  Yes McLean, Dalton S, MD  clotrimazole (LOTRIMIN) 1 % cream Apply to affected area 2 times daily 05/21/24  Yes Beverley Doffing A, PA-C  dapagliflozin  propanediol (FARXIGA ) 10 MG TABS tablet TAKE 1 TABLET BY MOUTH EVERY DAY BEFORE BREAKFAST 04/23/24  Yes Rolan Ezra RAMAN, MD  ENTRESTO  24-26 MG TAKE 1 TABLET BY MOUTH TWICE A DAY 04/01/24  Yes Rolan Ezra RAMAN, MD  eplerenone  (INSPRA ) 25 MG tablet Take 1 tablet (25 mg total) by mouth daily. 03/20/24  Yes Rolan Ezra RAMAN, MD  famotidine  (PEPCID ) 20 MG tablet Take 1 tablet (20 mg total) by mouth daily. 03/10/24  Yes Antonio Meth, Jamee R, DO  fenofibrate  (TRICOR ) 145 MG tablet TAKE 1 TABLET BY MOUTH EVERY DAY 01/22/23  Yes Rolan Ezra RAMAN, MD  furosemide  (LASIX ) 40 MG tablet Take 80 mg by mouth 2 (two) times daily. 11/15/23  Yes [provider]  gabapentin  (NEURONTIN ) 300 MG capsule Take 300 mg by mouth 2 (two) times daily as needed (pain). 07/31/22  Yes [provider]  HUMALOG  MIX 75/25 KWIKPEN (75-25) 100 UNIT/ML KwikPen Take 10 units with morning meal and 10 units with evening meal Patient taking differently: Inject 0-30 Units into the skin daily. If BS is  100 or < = No Insulin  100 - 140 = 10 Units Over 140 = 30 Units in the Morning and 20 Units at Bedtime 12/27/23  Yes Rojelio Nest, DO  levothyroxine  (SYNTHROID ) 25 MCG tablet Take 25 mcg by mouth daily.  04/06/24  Yes [provider]  LIVALO  4 MG TABS TAKE 1 TABLET BY MOUTH EVERY DAY 03/06/24  Yes Rolan Ezra RAMAN, MD  LORazepam  (ATIVAN ) 0.5 MG tablet Take 1 tablet (0.5 mg total) by mouth at bedtime. 03/10/24  Yes Antonio Meth Jamee JONELLE, DO  metoprolol  succinate (TOPROL -XL) 25 MG 24 hr tablet TAKE 1 TABLET BY MOUTH EVERYDAY AT BEDTIME Patient taking differently: Take 25 mg by mouth daily. 04/23/23  Yes Hayes Lander L, NP  mexiletine (MEXITIL ) 150 MG capsule TAKE 2 CAPSULES (300 MG TOTAL) BY MOUTH 2 (TWO) TIMES DAILY. 05/10/23  Yes Camnitz, Will Gladis, MD  MOUNJARO 2.5 MG/0.5ML Pen Inject 2.5 mg into the skin once a week. Thursdays   Yes [provider]  Multiple Vitamin (MULTIVITAMIN WITH MINERALS) TABS tablet Take 1 tablet by mouth daily.   Yes [provider]  ondansetron  (ZOFRAN -ODT) 4 MG disintegrating tablet Take 1 tablet (4 mg total) by  mouth every 6 (six) hours as needed for nausea or vomiting. 05/21/24  Yes Prosperi, Christian H, PA-C  potassium chloride  (MICRO-K ) 10 MEQ CR capsule TAKE 1 CAPSULE BY MOUTH 2 TIMES DAILY. 05/12/24  Yes Bensimhon, Toribio SAUNDERS, MD  sertraline  (ZOLOFT ) 25 MG tablet Take 1 tablet (25 mg total) by mouth daily. 03/10/24  Yes Antonio Cyndee Rockers R, DO  VASCEPA  1 g capsule TAKE 2 CAPSULES BY MOUTH TWICE A DAY 10/10/23  Yes Rolan Ezra RAMAN, MD  acetaminophen  (TYLENOL ) 500 MG tablet Take 500 mg by mouth every 6 (six) hours as needed for moderate pain (pain score 4-6) or headache.    [provider]  BD PEN NEEDLE NANO 2ND GEN 32G X 4 MM MISC See admin instructions. 09/24/21   [provider]  ondansetron  (ZOFRAN ) 8 MG tablet Take 1 tablet (8 mg total) by mouth every 8 (eight) hours as needed for nausea or vomiting. Patient not taking: Reported on 05/23/2024 05/02/24   Antonio Cyndee, Rockers SAUNDERS, DO  ONETOUCH VERIO test strip 1 each 3 (three) times daily. 10/09/21   [provider]     CC: 60 mins    Christian Mckynzie Liwanag AGACNP-BC    [facility] 06/05/2024, 2:38 PM  Please see [email or web address] for pager details.  From 7A-7P if no response, please call [phone]. After hours, please call ELink [phone].

## 2024-06-06 ENCOUNTER — Inpatient Hospital Stay (HOSPITAL_COMMUNITY)

## 2024-06-06 DIAGNOSIS — I509 Heart failure, unspecified: Secondary | ICD-10-CM

## 2024-06-06 DIAGNOSIS — J810 Acute pulmonary edema: Secondary | ICD-10-CM | POA: Insufficient documentation

## 2024-06-06 LAB — POCT I-STAT 7, (LYTES, BLD GAS, ICA,H+H)
Acid-base deficit: 10 mmol/L — ABNORMAL HIGH (ref 0.0–2.0)
Acid-base deficit: 10 mmol/L — ABNORMAL HIGH (ref 0.0–2.0)
Bicarbonate: 17.2 mmol/L — ABNORMAL LOW (ref 20.0–28.0)
Bicarbonate: 20.2 mmol/L (ref 20.0–28.0)
Calcium, Ion: 0.84 mmol/L — CL (ref 1.15–1.40)
Calcium, Ion: 1.14 mmol/L — ABNORMAL LOW (ref 1.15–1.40)
HCT: 37 % (ref 36.0–46.0)
HCT: 49 % — ABNORMAL HIGH (ref 36.0–46.0)
Hemoglobin: 12.6 g/dL (ref 12.0–15.0)
Hemoglobin: 16.7 g/dL — ABNORMAL HIGH (ref 12.0–15.0)
O2 Saturation: 94 %
O2 Saturation: 81 %
Patient temperature: 96.7
Patient temperature: 96.1
Potassium: 3.3 mmol/L — ABNORMAL LOW (ref 3.5–5.1)
Potassium: 4 mmol/L (ref 3.5–5.1)
Sodium: 144 mmol/L (ref 135–145)
Sodium: 139 mmol/L (ref 135–145)
TCO2: 18 mmol/L — ABNORMAL LOW (ref 22–32)
TCO2: 22 mmol/L (ref 22–32)
pCO2 arterial: 40.7 mmHg (ref 32–48)
pCO2 arterial: 55.1 mmHg — ABNORMAL HIGH (ref 32–48)
pH, Arterial: 7.227 — ABNORMAL LOW (ref 7.35–7.45)
pH, Arterial: 7.163 — CL (ref 7.35–7.45)
pO2, Arterial: 81 mmHg — ABNORMAL LOW (ref 83–108)
pO2, Arterial: 54 mmHg — ABNORMAL LOW (ref 83–108)

## 2024-06-06 LAB — COMPREHENSIVE METABOLIC PANEL WITH GFR
ALT: 33 U/L (ref 0–44)
AST: 47 U/L — ABNORMAL HIGH (ref 15–41)
Albumin: 1.8 g/dL — ABNORMAL LOW (ref 3.5–5.0)
Alkaline Phosphatase: 67 U/L (ref 38–126)
Anion gap: 12 (ref 5–15)
BUN: 59 mg/dL — ABNORMAL HIGH (ref 8–23)
CO2: 21 mmol/L — ABNORMAL LOW (ref 22–32)
Calcium: 9.3 mg/dL (ref 8.9–10.3)
Chloride: 98 mmol/L (ref 98–111)
Creatinine, Ser: 1.95 mg/dL — ABNORMAL HIGH (ref 0.44–1.00)
GFR, Estimated: 25 mL/min — ABNORMAL LOW (ref >60)
Glucose, Bld: 161 mg/dL — ABNORMAL HIGH (ref 70–99)
Potassium: 4.1 mmol/L (ref 3.5–5.1)
Sodium: 131 mmol/L — ABNORMAL LOW (ref 135–145)
Total Bilirubin: 1 mg/dL (ref 0.0–1.2)
Total Protein: 5.2 g/dL — ABNORMAL LOW (ref 6.5–8.1)

## 2024-06-06 LAB — CBC
HCT: 45.8 % (ref 36.0–46.0)
Hemoglobin: 14.7 g/dL (ref 12.0–15.0)
MCH: 30.5 pg (ref 26.0–34.0)
MCHC: 32.1 g/dL (ref 30.0–36.0)
MCV: 95 fL (ref 80.0–100.0)
Platelets: 117 K/uL — ABNORMAL LOW (ref 150–400)
RBC: 4.82 MIL/uL (ref 3.87–5.11)
RDW: 17.2 % — ABNORMAL HIGH (ref 11.5–15.5)
WBC: 15.6 K/uL — ABNORMAL HIGH (ref 4.0–10.5)
nRBC: 0.3 % — ABNORMAL HIGH (ref 0.0–0.2)

## 2024-06-06 LAB — COOXEMETRY PANEL
Carboxyhemoglobin: 1.1 % (ref 0.5–1.5)
Methemoglobin: <0.7 % (ref 0.0–1.5)
O2 Saturation: 75 %
Total hemoglobin: 14.1 g/dL (ref 12.0–16.0)

## 2024-06-06 LAB — HEPARIN LEVEL (UNFRACTIONATED): Heparin Unfractionated: >1.1 [IU]/mL — ABNORMAL HIGH (ref 0.30–0.70)

## 2024-06-06 LAB — APTT: aPTT: 78 s — ABNORMAL HIGH (ref 24–36)

## 2024-06-06 MED ORDER — MIDAZOLAM HCL (PF) 2 MG/2ML IJ SOLN
2.0000 mg | INTRAMUSCULAR | Status: DC | PRN
  Administered 2024-06-06: 2 mg via INTRAVENOUS
  Filled 2024-06-06: qty 4

## 2024-06-06 MED ORDER — ACETAMINOPHEN 325 MG PO TABS: 650.0000 mg | ORAL_TABLET | Freq: Four times a day (QID) | ORAL | Status: DC | PRN

## 2024-06-06 MED ORDER — SODIUM CHLORIDE 0.9 % IV SOLN: INTRAVENOUS | Status: DC

## 2024-06-06 MED ORDER — GLYCOPYRROLATE 1 MG PO TABS: 1.0000 mg | ORAL_TABLET | ORAL | Status: DC | PRN

## 2024-06-06 MED ORDER — ONDANSETRON 4 MG PO TBDP: 4.0000 mg | ORAL_TABLET | Freq: Four times a day (QID) | ORAL | Status: DC | PRN

## 2024-06-06 MED ORDER — GLYCOPYRROLATE 0.2 MG/ML IJ SOLN: 0.2000 mg | INTRAMUSCULAR | Status: DC | PRN

## 2024-06-06 MED ORDER — POLYVINYL ALCOHOL 1.4 % OP SOLN: 1.0000 [drp] | Freq: Four times a day (QID) | OPHTHALMIC | Status: DC | PRN

## 2024-06-06 MED ORDER — DEXTROSE 5 % IV SOLN
20.0000 mg/h | INTRAVENOUS | Status: DC
  Administered 2024-06-06: 20 mg/h via INTRAVENOUS
  Filled 2024-06-06: qty 20

## 2024-06-06 MED ORDER — SODIUM CHLORIDE 0.9 % IV SOLN
4.0000 g | Freq: Once | INTRAVENOUS | Status: AC
  Administered 2024-06-06: 4 g via INTRAVENOUS
  Filled 2024-06-06: qty 40

## 2024-06-06 MED ORDER — GLYCOPYRROLATE 0.2 MG/ML IJ SOLN: 0.1000 mg | Freq: Once | INTRAMUSCULAR | Status: DC

## 2024-06-06 MED ORDER — GLYCOPYRROLATE 0.2 MG/ML IJ SOLN
0.2000 mg | INTRAMUSCULAR | Status: DC | PRN
  Administered 2024-06-06: 0.2 mg via INTRAVENOUS
  Filled 2024-06-06: qty 1

## 2024-06-06 MED ORDER — GLYCOPYRROLATE 0.2 MG/ML IJ SOLN
0.1000 mg | Freq: Three times a day (TID) | INTRAMUSCULAR | Status: DC
  Administered 2024-06-06: 0.1 mg via INTRAVENOUS
  Filled 2024-06-06: qty 1

## 2024-06-06 MED ORDER — ACETAMINOPHEN 650 MG RE SUPP: 650.0000 mg | Freq: Four times a day (QID) | RECTAL | Status: DC | PRN

## 2024-06-06 MED ORDER — ONDANSETRON HCL 4 MG/2ML IJ SOLN: 4.0000 mg | Freq: Four times a day (QID) | INTRAMUSCULAR | Status: DC | PRN

## 2024-06-06 MED ORDER — MORPHINE SULFATE (PF) 2 MG/ML IV SOLN
2.0000 mg | INTRAVENOUS | Status: DC | PRN
  Administered 2024-06-06: 2 mg via INTRAVENOUS
  Filled 2024-06-06: qty 2
  Filled 2024-06-06: qty 1

## 2024-06-06 MED ORDER — FUROSEMIDE 10 MG/ML IJ SOLN
120.0000 mg | Freq: Four times a day (QID) | INTRAVENOUS | Status: AC
  Administered 2024-06-06: 120 mg via INTRAVENOUS
  Filled 2024-06-06: qty 10

## 2024-06-09 LAB — CULTURE, BLOOD (ROUTINE X 2)
Culture: NO GROWTH
Culture: NO GROWTH
Special Requests: ADEQUATE
Special Requests: ADEQUATE

## 2024-06-16 NOTE — Death Summary Note (Signed)
 DEATH SUMMARY   Patient Details  Name: Heidi Stark MRN: [id number] DOB: [birth year]  Admission/Discharge Information   Admit Date:  June 15, 2024  Date of Death: Date of Death: 2024-06-30  Time of Death: Time of Death: 0720  Length of Stay: July 23, 2024  Referring Physician: Antonio Stark, Heidi SAUNDERS, DO   Reason(s) for Hospitalization  Dehydration From  GI illness with nausea, vomiting and diarrhea . Complicated by recurrent VT, AICD RV lead dislodgement, acute heart failure, septic shock and pneumonia   Diagnoses  Preliminary cause of death: acute heart failure with acute pulmonary edema superimposed on pneumonia and baseline severe restrictive lung disease due to morbid obesity  Secondary Diagnoses (including complications and co-morbidities):  Principal Problem:   AKI (acute kidney injury) Active Problems:   Obstructive sleep apnea   Essential hypertension   COPD (chronic obstructive pulmonary disease) (HCC)   Paroxysmal atrial fibrillation (HCC)   Severe obesity (BMI >= 40) (HCC)   Diarrhea   Type 2 diabetes mellitus with hyperglycemia, with long-term current use of insulin  (HCC)   Encounter for assessment for deep vein thrombosis (DVT)   VT (ventricular tachycardia) (HCC)   CKD (chronic kidney disease) stage 3, GFR 30-59 ml/min (HCC)   Diabetic nephropathy (HCC)   Type 2 diabetes mellitus with hyperlipidemia (HCC)   Hypothyroidism   Nausea and vomiting   Dehydration   Change in bowel habits   Nausea without vomiting   Septic shock (HCC)   Acute encephalopathy   Pressure injury of skin   Pneumonia of both lower lobes due to infectious organism   Acute pulmonary edema (HCC)   Acute on chronic heart failure [facility])   Brief Hospital Course (including significant findings, care, treatment, and services provided and events leading to death)  Heidi Stark is a 83 y.o. year old female who Initially admitted June 15, 2024 w/GI illness with N/V and Diarrhea. Was weak not able to take POs.  Admitted to hospital team. GI consulted. Was dehydrated and hypotensive. Several of cardiac meds either not taken or held. Was re-hydrated, diarrhea subsided, course then complicated by recurrent VT.   On 11/18 more hypotensive in spite of midodrine  10mg  tid. More lethargic, Mental status declined, her extremities were cool, she had leukocytosis and lactic acid of 2.9. ECHO reported as RV dilated w/ reduced fxn. Because of sudden clinical decline she was brought to the cardiac cath lab for right heart catheterization and subsequently transferred to the ICU post cath PCCM asked to assist w/ care to help treat undifferentiated shock. A right heart cath had been completed initially. This was consistent w/ RV dysfxn and there was papi of 1.2. CI was 2.18 and SVR 1205. Was placed on broad spectrum antibiotics to cover for possible occult infection and supported in the ICU w/ concern for possible PNA vs pulmonary edema. Was briefly on norepinephrine . On 1120 was off norepinephrine . Still had increased work of breathing attributed to pneumonia and possible bronchospasm.  In the evening hours of 11/20 and into the early hours of 06/30/24 the patient began to develop progressive increase in shortness of breath and worsening hypoxia. Had frothy oral secretions. Was administered IV lasix  with limited response in escalating doses. Over the remainder of the morning she had limited response to both aggressive diuresis and escalation to NIPPV. Family was called to bedside and shared that [name] would not want mechanical ventilation and her instructions to them were very clear in this situation. Because of this the NIPPV mask was removed, the patient  was transitioned to comfort care and she passed away at 0720     Pertinent Labs and Studies  Significant Diagnostic Studies DG CHEST PORT 1 VIEW Result Date: June 26, 2024 EXAM: 1 VIEW(S) XRAY OF THE CHEST 2024/06/26 12:29:00 AM COMPARISON: CXR 06/03/2024, CT Chest 06/04/2024.  CLINICAL HISTORY: Hypoxia. FINDINGS: LINES, TUBES AND DEVICES: Right upper extremity PICC in place with tip at superior cavoatrial junction. LUNGS AND PLEURA: Small bilateral pleural effusions. Increased bilateral patchy airspace opacities. Increased diffuse interstitial prominence. No pneumothorax. HEART AND MEDIASTINUM: Unchanged cardiomegaly. Severe mitral annular calcifications. Atherosclerotic calcifications. Left chest cardiac pacing device noted. BONES AND SOFT TISSUES: No acute osseous abnormality. IMPRESSION: 1. Increased bilateral patchy airspace opacities and diffuse interstitial prominence, compatible with worsening pulmonary edema or infection. 2. Small bilateral pleural effusions. 3. Right upper extremity PICC with tip at the superior cavoatrial junction. Electronically signed by: Morgane Naveau MD 2024-06-26 12:34 AM EST RP Workstation: [id number]   CT CHEST ABDOMEN PELVIS W CONTRAST Result Date: 06/04/2024 EXAM: CT CHEST ABDOMEN PELVIS WITH THORACIC AND LUMBAR SPINE RECONSTRUCTIONS 06/04/2024 12:42:39 PM TECHNIQUE: CT of the chest, abdomen, pelvis was performed after the administration of 75 mL of intravenous iohexol  (OMNIPAQUE ) 350 MG/ML injection. Multiplanar reformatted images are provided for review, including reconstructed images of the thoracic and lumbar spine. Automated exposure control, iterative reconstruction, and/or weight based adjustment of the mA/kV was utilized to reduce the radiation dose to as low as reasonably achievable. COMPARISON: CT 05/21/2024 CLINICAL HISTORY: Sepsis. FINDINGS: CT CHEST: THORACIC AORTA: No acute traumatic injury of the aorta. MEDIASTINUM: No mediastinal hematoma or pneumomediastinum. No acute traumatic injury to the heart or pericardium. The central airways are clear. PICC line in place. LUNGS: Confluent airspace disease in the right upper lobe. Bibasilar atelectasis and pleural effusions. No acute traumatic injury to the lungs. No pulmonary contusion or  laceration. CHEST WALL: No acute displaced rib fracture. No chest wall hematoma. CT ABDOMEN AND PELVIS: ABDOMINAL AORTA: Atherosclerotic calcification of the aorta. No acute traumatic injury of the aorta or iliac arteries. HEPATOBILIARY: The gallbladder is distended to 5.8 cm with a single small gallstone but no additional evidence of acute cholecystitis. No bile ducts normal. SPLEEN: No acute traumatic injury. PANCREAS: No acute traumatic injury. ADRENAL GLANDS: Bilateral enlargement of the adrenal glands suggesting hyperplasia. KIDNEYS: There is a small indeterminate lesion of the right kidney measuring 14 mm which is favored to be a cyst but cannot be fully characterized due to streak artifact and small size. Increased from 9 mm on CT 12/03/2014. Kidneys, ureters, and bladder are otherwise normal. No hydronephrosis. GI TRACT: No acute traumatic injury of the bowel. No bowel obstruction. No bowel infection, inflammation, or obstruction. Normal appendix. PERITONEUM: No ascites or free air. RETROPERITONEUM: No retroperitoneal hematoma. BLADDER: No acute abnormality. REPRODUCTIVE ORGANS: No acute abnormality. BONES: No acute traumatic fracture of the pelvis. No aggressive osseous lesion. THORACIC AND LUMBAR SPINE: BONES AND ALIGNMENT: No traumatic fracture or traumatic malalignment. DEGENERATIVE CHANGES: No severe spinal canal stenosis or bony neural foraminal narrowing. SOFT TISSUES: No paraspinal mass or hematoma. IMPRESSION: 1. Right upper lobe airspace disease, suggestive of pneumonia. 2. Bilateral pleural effusions with basilar atelectasis. 3. Distended gallbladder with a single small gallstone, without additional evidence of acute cholecystitis. 4. Small indeterminate enlarging 14 mm right renal lesion; recommend renal protocol MRI or CT without and with contrast for further characterization. Electronically signed by: Norleen Boxer MD 06/04/2024 05:18 PM EST RP Workstation: [id number]   DG CHEST PORT 1  VIEW Result Date:  06/03/2024 EXAM: 1 VIEW(S) XRAY OF THE CHEST 06/03/2024 09:21:00 PM COMPARISON: 06/03/2024 CLINICAL HISTORY: S/P PICC central line placement. FINDINGS: LINES, TUBES AND DEVICES: Right upper extremity PICC advanced. Tip overlies right atrium. Left chest AICD in place. LUNGS AND PLEURA: Slightly worsening pulmonary edema. Slightly worsening patchy airspace opacities in right upper lung. Trace bilateral pleural effusions. No pneumothorax. HEART AND MEDIASTINUM: Dense mitral annular calcifications. Persistent cardiomegaly. Aortic atherosclerosis. BONES AND SOFT TISSUES: No acute osseous abnormality. IMPRESSION: 1. Interval advancement of the right PICC with tip overlying the right atrium. Recommend retracting by 5cm. 2. Slightly worsening pulmonary edema and patchy right upper lung airspace opacities, suspicious for edema with possible superimposed infection or inflammation. 3. Trace bilateral pleural effusions. Electronically signed by: Morgane Naveau MD 06/03/2024 09:52 PM EST RP Workstation: [id number]   DG CHEST PORT 1 VIEW Result Date: 06/03/2024 EXAM: 1 VIEW(S) XRAY OF THE CHEST 06/03/2024 06:40:29 PM COMPARISON: 06/03/2024 CLINICAL HISTORY: Status post PICC central line placement FINDINGS: LINES, TUBES AND DEVICES: New right PICC with tip overlying the brachiocephalic confluence with the SVC. Left single lead cardiac defibrillator device in place. LUNGS AND PLEURA: Mild diffuse interstitial opacities with more patchy right upper lung opacities. Trace bilateral pleural effusions. No pneumothorax. HEART AND MEDIASTINUM: Stable cardiomegaly. Aortic atherosclerosis. BONES AND SOFT TISSUES: No acute osseous abnormality. IMPRESSION: 1. Mild diffuse interstitial opacities with more focal right upper lung opacities. 2. Trace bilateral pleural effusions. 3. New right PICC with tip at the brachiocephalic confluence with the SVC. Electronically signed by: Morgane Naveau MD 06/03/2024 07:28 PM EST RP  Workstation: [id number]   US  EKG SITE RITE Result Date: 06/03/2024 If Site Rite image not attached, placement could not be confirmed due to current cardiac rhythm.  CARDIAC CATHETERIZATION Result Date: 06/03/2024 1. Elevated RA pressure out of proportion to PCWP, primarily RV failure.  PAPi low. 2. CO low but not markedly low. 3. Mild mixed pulmonary arterial/pulmonary venous hypertension.   DG CHEST PORT 1 VIEW Result Date: 06/03/2024 CLINICAL DATA:  Acute respiratory failure. EXAM: PORTABLE CHEST 1 VIEW COMPARISON:  05/26/2024. FINDINGS: Stable cardiomegaly. Unchanged left-sided AICD. Aortic atherosclerosis. Increased asymmetric interstitial and patchy airspace opacities throughout the right lung. No sizable pleural effusion or pneumothorax. No acute osseous abnormality. IMPRESSION: 1. Increased asymmetric interstitial and patchy airspace opacities throughout the right lung could reflect asymmetric pulmonary edema or an infectious/inflammatory etiology. 2. Stable cardiomegaly. Electronically Signed   By: Harrietta Sherry M.D.   On: 06/03/2024 14:28   ECHOCARDIOGRAM COMPLETE Result Date: 06/03/2024    ECHOCARDIOGRAM REPORT   Patient Name:   DIONDRA PINES Date of Exam: 06/03/2024 Medical Rec #:  [id number]      Height:       66.0 in Accession #:    [id number]     Weight:       250.0 lb Date of Birth:  Sep 07, 1940      BSA:          2.199 m Patient Age:    83 years       BP:           118/71 mmHg Patient Gender: F              HR:           121 bpm. Exam Location:  Inpatient Procedure: 2D Echo, Cardiac Doppler and Color Doppler (Both Spectral and Color            Flow Doppler were utilized during procedure). Indications:  Congestive Heart Failure I50.9  History:        Patient has prior history of Echocardiogram examinations, most                 recent 12/18/2023. CHF, Arrythmias:Atrial Fibrillation; Risk                 Factors:Hypertension, Diabetes and Sleep Apnea.  Sonographer:    Jayson Gaskins  Referring Phys: [id number] LOGAN N LOCKWOOD IMPRESSIONS  1. Left ventricular ejection fraction, by estimation, is 70 to 75%. The left ventricle has hyperdynamic function. There is mild left ventricular hypertrophy.  2. Right ventricular systolic function is mildly reduced. The right ventricular size is mildly enlarged.  3. Left atrial size was moderately dilated.  4. Right atrial size was mildly dilated.  5. Trivial mitral valve regurgitation. Moderate mitral annular calcification.  6. The aortic valve has an indeterminant number of cusps. Aortic valve regurgitation is not visualized. Comparison(s): The left ventricular function is unchanged. FINDINGS  Left Ventricle: Left ventricular ejection fraction, by estimation, is 70 to 75%. The left ventricle has hyperdynamic function. The left ventricular internal cavity size was normal in size. There is mild left ventricular hypertrophy. Right Ventricle: The right ventricular size is mildly enlarged. Right vetricular wall thickness was not assessed. Right ventricular systolic function is mildly reduced. Left Atrium: Left atrial size was moderately dilated. Right Atrium: Right atrial size was mildly dilated. Pericardium: There is no evidence of pericardial effusion. Mitral Valve: There is mild thickening of the mitral valve leaflet(s). Moderate mitral annular calcification. Trivial mitral valve regurgitation. MV peak gradient, 14.4 mmHg. The mean mitral valve gradient is 4.5 mmHg. Tricuspid Valve: The tricuspid valve is normal in structure. Tricuspid valve regurgitation is mild. Aortic Valve: The aortic valve has an indeterminant number of cusps. Aortic valve regurgitation is not visualized. Aortic valve mean gradient measures 5.0 mmHg. Aortic valve peak gradient measures 11.0 mmHg. Aortic valve area, by VTI measures 2.22 cm. Pulmonic Valve: The pulmonic valve was not well visualized. Aorta: The aortic root and ascending aorta are structurally normal, with no evidence of  dilitation. IAS/Shunts: No atrial level shunt detected by color flow Doppler. Additional Comments: A device lead is visualized.  LEFT VENTRICLE PLAX 2D LVIDd:         4.30 cm   Diastology LVIDs:         3.30 cm   LV e' medial:    10.00 cm/s LV PW:         1.20 cm   LV E/e' medial:  11.3 LV IVS:        1.20 cm   LV e' lateral:   14.40 cm/s LVOT diam:     2.00 cm   LV E/e' lateral: 7.8 LV SV:         46 LV SV Index:   21 LVOT Area:     3.14 cm  LEFT ATRIUM           Index        RIGHT ATRIUM           Index LA Vol (A2C): 40.9 ml 18.60 ml/m  RA Area:     23.50 cm LA Vol (A4C): 98.3 ml 44.71 ml/m  RA Volume:   75.00 ml  34.11 ml/m  AORTIC VALVE AV Area (Vmax):    2.06 cm AV Area (Vmean):   2.41 cm AV Area (VTI):     2.22 cm AV Vmax:  166.00 cm/s AV Vmean:          103.000 cm/s AV VTI:            0.207 m AV Peak Grad:      11.0 mmHg AV Mean Grad:      5.0 mmHg LVOT Vmax:         109.00 cm/s LVOT Vmean:        79.000 cm/s LVOT VTI:          0.146 m LVOT/AV VTI ratio: 0.71  AORTA Ao Root diam: 2.80 cm MITRAL VALVE MV Area (PHT): 4.06 cm     SHUNTS MV Area VTI:   1.31 cm     Systemic VTI:  0.15 m MV Peak grad:  14.4 mmHg    Systemic Diam: 2.00 cm MV Mean grad:  4.5 mmHg MV Vmax:       1.90 m/s MV Vmean:      94.2 cm/s MV Decel Time: 187 msec MV E velocity: 113.00 cm/s MV A velocity: 36.20 cm/s MV E/A ratio:  3.12 Vina Gull MD Electronically signed by Vina Gull MD Signature Date/Time: 06/03/2024/2:20:43 PM    Final    US  RENAL Result Date: 05/31/2024 CLINICAL DATA:  Urinary retention. Chronic kidney disease stage III. EXAM: RENAL / URINARY TRACT ULTRASOUND COMPLETE COMPARISON:  CT 05/21/2024, 06/12/2016, 11/23/2014 and ultrasound 05/24/2024 FINDINGS: Right Kidney: Renal measurements: 10.9 x 4.5 x 5.7 cm = volume: 147 mL. Echogenicity within normal limits. No mass or hydronephrosis visualized. 1 cm cortical hypodensity over the lower pole unchanged likely a small cyst. Left Kidney: Renal measurements:  11.1 x 5.6 x 6.2 cm = volume: 204 mL. Echogenicity within normal limits. No mass or hydronephrosis visualized. Bladder: Appears normal for degree of bladder distention. Prevoid volume 390 mL. Postvoid volume not recorded. Other: None. IMPRESSION: Normal size kidneys without hydronephrosis. 1 cm right renal cortical hypodensity unchanged likely a small cyst. Electronically Signed   By: Toribio Agreste M.D.   On: 05/31/2024 13:34   DG CHEST PORT 1 VIEW Result Date: 05/26/2024 CLINICAL DATA:  Pneumonia. EXAM: PORTABLE CHEST 1 VIEW COMPARISON:  Chest radiograph dated 05/22/2024 FINDINGS: No focal consolidation, pleural effusion, pneumothorax. Mild cardiomegaly. Left pectoral AICD device. Atherosclerotic calcification of the aorta. No acute osseous pathology. IMPRESSION: 1. No active disease. 2. Mild cardiomegaly. Electronically Signed   By: Vanetta Chou M.D.   On: 05/26/2024 16:03   US  RENAL Result Date: 05/24/2024 EXAM: US  Retroperitoneum Complete, Renal. CLINICAL HISTORY: Urinary retention. TECHNIQUE: Real-time ultrasound of the retroperitoneum (complete) with image documentation. COMPARISON: CT of 05/21/2024. FINDINGS: RIGHT KIDNEY: Measures 10.8 x 4.3 x 5.0 cm. Volume 121 cc. No hydronephrosis, renal stone, or mass visualized. LEFT KIDNEY: Measures 12.2 x 5.6 x 6.0 cm. Volume 215 cc. No hydronephrosis, renal stone, or mass visualized. BLADDER: Unremarkable as visualized. INCIDENTAL FINDINGS: 11 mm gallstone incidentally identified. IMPRESSION: 1. No hydronephrosis. No significant bladder distention. 2. Cholelithiasis. Electronically signed by: Rockey Kilts MD 05/24/2024 02:54 PM EST RP Workstation: [id number]   DG Chest Portable 1 View Result Date: 05/22/2024 CLINICAL DATA:  Chest pain hypotension EXAM: PORTABLE CHEST 1 VIEW COMPARISON:  02/29/2024 FINDINGS: Left-sided single lead pacing device. Cardiomegaly without focal opacity, pleural effusion or pneumothorax. Diffuse bronchitic changes, increased  compared to prior. IMPRESSION: Cardiomegaly with diffuse bronchitic changes. Electronically Signed   By: Luke Bun M.D.   On: 05/22/2024 22:26   CT ABDOMEN PELVIS WO CONTRAST Result Date: 05/21/2024 EXAM: CT ABDOMEN AND PELVIS WITHOUT CONTRAST 05/21/2024  03:29:00 AM TECHNIQUE: CT of the abdomen and pelvis was performed without the administration of intravenous contrast. Multiplanar reformatted images are provided for review. Automated exposure control, iterative reconstruction, and/or weight-based adjustment of the mA/kV was utilized to reduce the radiation dose to as low as reasonably achievable. COMPARISON: CT abdomen and pelvis 05/12/2023 and earlier. CLINICAL HISTORY: 83 year old female. Abdominal pain, acute, nonlocalized. FINDINGS: LOWER CHEST: Stable cardiomegaly. Partially visible cardiac pacemaker lead. Calcified aortic atherosclerosis. No pericardial or pleural effusion. Chronic elevation of the left hemidiaphragm. Lung base ventilation improved from last year with some chronic lung base scarring. LIVER: Chronic hepatomegaly, liver length 25 cm. GALLBLADDER AND BILE DUCTS: Similar gallbladder distention to the CT last year. Chronic 6 mm gallstone. No regional inflammation. No bile duct dilatation. SPLEEN: Diminutive spleen. PANCREAS: No acute abnormality. ADRENAL GLANDS: Chronic asymmetric adrenal thickening greater on the left, suggesting adrenal hyperplasia. KIDNEYS, URETERS AND BLADDER: Non-contrast kidneys appear stable and nonobstructed. Urinary bladder distended similar to the CT last year. Vascular calcifications in the pelvis. No urinary calculus identified. No stones in the kidneys or ureters. No hydronephrosis. No perinephric or periureteral stranding. GI AND BOWEL: Redundant gas containing sigmoid colon in the left abdomen, but decompressed upstream and downstream large bowel. Chronic right periumbilical ventral abdominal hernia contains a short segment of the transverse colon as before  on series 2 image 48; no evidence of acute incarceration. Upstream right colon decompressed with some diverticulosis. Normal gas containing appendix on series 2 image 57. Nondilated small bowel and stomach. No free air or free fluid. PERITONEUM AND RETROPERITONEUM: No ascites. No free air. VASCULATURE: Aorta is normal in caliber. Vascular calcifications in the pelvis. LYMPH NODES: No lymphadenopathy. REPRODUCTIVE ORGANS: Negative non-contrast uterus and adnexa. BONES AND SOFT TISSUES: Diffuse severe spine degeneration. Widespread vacuum disc and severe disc space loss. Lower thoracic hyperostosis with some chronic interbody ankylosis. Chronic osteopenia. L1 inferior endplate compression fracture is stable from last year. Similar inferior endplate compression fracture of T12 is new. T12 vertebral body height now reduced to 15.2 mm versus 22.5 mm normally, representing 32% loss of vertebral body height. No significant bone retropulsion. No focal soft tissue abnormality. IMPRESSION: 1. No acute or inflammatory process identified in the non-contrast abdomen or pelvis. 2. New T12 inferior endplate compression fracture since October 2024, with 32% loss of height. No complicating features. Stable chronic L1 compression fracture. 3. Chronic cholelithiasis, small right periumbilical hernia - containing a short segment of transverse colon, and Aortic atherosclerosis. Electronically signed by: Helayne Hurst MD 05/21/2024 03:58 AM EST RP Workstation: [id number]    Microbiology Recent Results (from the past 240 hours)  Calprotectin, Fecal     Status: None   Collection Time: 05/27/24  7:05 PM   Specimen: Stool  Result Value Ref Range Status   Calprotectin, Fecal 61 0 - 120 ug/g Final    Comment: (NOTE) Concentration     Interpretation   Follow-Up < 5 - 50 ug/g     Normal           None >50 -120 ug/g     Borderline       Re-evaluate in 4-6 weeks    >120 ug/g     Abnormal         Repeat as clinically                                    indicated Performed At: [facility]  [place] [address] [place], [place] [id number] Jennette Shorter MD Ey:[id number]   Culture, blood (Routine X 2) w Reflex to ID Panel     Status: None (Preliminary result)   Collection Time: 06/03/24 10:15 AM   Specimen: BLOOD LEFT ARM  Result Value Ref Range Status   Specimen Description BLOOD LEFT ARM  Final   Special Requests   Final    BOTTLES DRAWN AEROBIC ONLY Blood Culture adequate volume   Culture   Final    NO GROWTH 3 DAYS Performed at [facility], [address], [place], [place] [address]    Report Status PENDING  Incomplete  Culture, blood (Routine X 2) w Reflex to ID Panel     Status: None (Preliminary result)   Collection Time: 06/03/24 10:18 AM   Specimen: BLOOD RIGHT ARM  Result Value Ref Range Status   Specimen Description BLOOD RIGHT ARM  Final   Special Requests   Final    BOTTLES DRAWN AEROBIC AND ANAEROBIC Blood Culture adequate volume   Culture   Final    NO GROWTH 3 DAYS Performed at [facility], [address], [place], [place] [address]    Report Status PENDING  Incomplete    Lab Basic Metabolic Panel: Recent Labs  Lab 06/03/24 1959 06/04/24 0134 06/04/24 0456 06/05/24 0222 06-26-2024 0043 2024-06-26 0410 2024-06-26 0520  NA 137 133* 134* 132* 144 131* 139  K 3.3* 4.6 4.0 4.4 3.3* 4.1 4.0  CL 100 97* 101 99  --  98  --   CO2 20* 20* 20* 21*  --  21*  --   GLUCOSE 117* 150* 130* 116*  --  161*  --   BUN 38* 39* 43* 53*  --  59*  --   CREATININE 1.14* 1.38* 1.35* 1.49*  --  1.95*  --   CALCIUM  6.6* 7.1* 7.0* 7.3*  --  9.3  --   MG 2.0 2.1  --   --   --   --   --    Liver Function Tests: Recent Labs  Lab 06/03/24 0810 06/05/24 0222 26-Jun-2024 0410  AST 37 44* 47*  ALT 23 31 33  ALKPHOS 59 61 67  BILITOT 0.8 0.9 1.0  PROT 5.1* 5.1* 5.2*  ALBUMIN 2.0* 1.9* 1.8*   No results for input(s): LIPASE, AMYLASE in the last 168 hours. No results for input(s): AMMONIA in the  last 168 hours. CBC: Recent Labs  Lab 05/31/24 0455 06/01/24 0556 06/02/24 0416 06/03/24 0554 06/03/24 1611 06/03/24 1837 06/03/24 1855 06/04/24 0456 06/05/24 0222 06-26-2024 0043 2024-06-26 0410 06/26/24 0520  WBC 10.8* 7.9   < > 14.4*  --  14.6*  --  16.7* 15.4*  --  15.6*  --   NEUTROABS 5.7 4.1  --   --   --   --   --   --   --   --   --   --   HGB 15.5* 14.9   < > 14.5   < > 14.3   < > 14.3 14.5 12.6 14.7 16.7*  HCT 48.8* 44.5   < > 43.0   < > 42.9   < > 42.6 44.4 37.0 45.8 49.0*  MCV 94.9 90.6   < > 90.1  --  90.9  --  91.4 90.8  --  95.0  --   PLT 121* 124*   < > 142*  --  151  --  150 127*  --  117*  --    < > = values in this interval not displayed.   Cardiac Enzymes: No results for input(s): CKTOTAL, CKMB, CKMBINDEX, TROPONINI in the last 168 hours. Sepsis Labs: Recent Labs  Lab 06/03/24 1316 06/03/24 1436 06/03/24 1837 06/03/24 1850 06/04/24 0456 06/04/24 0457 06/05/24 0222 08-Jun-2024 0410  PROCALCITON  --   --  0.55  --   --   --   --   --   WBC  --   --  14.6*  --  16.7*  --  15.4* 15.6*  LATICACIDVEN 2.3* 2.5*  --  1.8  --  1.8  --   --     Procedures/Operations  Right heart cath 11/19 Left axillary arterial line 11/19   Jeralyn FORBES Banner 08-Jun-2024, 3:31 PM

## 2024-06-16 NOTE — Progress Notes (Signed)
 PHARMACY - ANTICOAGULATION CONSULT NOTE  Pharmacy Consult for heparin  Indication: Afib and h/o VTE  Labs: Recent Labs    06/03/24 1837 06/04/24 0134 06/04/24 0456 06/04/24 1314 06/05/24 0222 06/05/24 0241 06/05/24 1443 06/05/24 1911 2024-06-23 0043 June 23, 2024 0410  HGB  --   --  14.3  --  14.5  --   --   --  12.6 14.7  HCT  --   --  42.6  --  44.4  --   --   --  37.0 45.8  PLT  --   --  150  --  127*  --   --   --   --  117*  APTT   < >  --  108* 58*  --    < > 198* 42*  --  78*  HEPARINUNFRC  --   --  >1.10* >1.10* >1.10*  --   --   --   --   --   CREATININE  --  1.38* 1.35*  --  1.49*  --   --   --   --   --    < > = values in this interval not displayed.   Assessment/Plan:  83yo female therapeutic on heparin  after rate change. Will continue infusion at current rate of 800 units/hr and confirm stable with additional PTT.  Marvetta Dauphin, PharmD, BCPS June 23, 2024 5:01 AM

## 2024-06-16 NOTE — Plan of Care (Signed)
  Problem: Fluid Volume: Goal: Ability to maintain a balanced intake and output will improve Outcome: Not Progressing   Problem: Skin Integrity: Goal: Risk for impaired skin integrity will decrease Outcome: Not Progressing   Problem: Clinical Measurements: Goal: Respiratory complications will improve Outcome: Not Progressing   Problem: Clinical Measurements: Goal: Cardiovascular complication will be avoided Outcome: Not Progressing

## 2024-06-16 NOTE — Progress Notes (Addendum)
 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 She required transfer to the ICU for septic shock due to pneumonia. She remains confused.   Tonight, patient had escalating hypoxemia saturating in the mid 80s despite escalation to nonrebreather.  Attempted NT suctioning and deep oropharyngeal suctioning with no limited response.  She is still appears to have frothy secretions intermittently.  No response to Lasix  at 80 mg dosing.  Fortunately, she is off of vasopressors.  eICU Interventions  Will escalate to BiPAP therapy 15/10  Lasix  120 mg dosing  Add glycopyrrolate , but suspect this is secondary to pulmonary edema and may be of limited use.  Will place nasopharyngeal airway while she is somnolent  Obtain chest radiograph, ABG  I discussed the case with the family who is coming to bedside, discussed that there are limited options to escalate care past the BiPAP and she has limited reserve/responsiveness.  They requested I discussed the case with cardiology, who agreed with the current care.   0109 - Calcium  supplementation, discussed clinical course with family at bedside.  Introduced the idea of comfort measures if the patient does not respond to therapy as discussed-family wants to ensure that the patient remains comfortable.  Radiograph and ABG reviewed.  Maintain BiPAP for now.  If limited response to Lasix  push, will attempt infusion.   0222 - of UOP since lasix  received about an hour ago. Will initiate drip, but suspect limited response to diuretics. If there is still limited response, may need to consider rediscussing goals of care.  0400 -somewhat hypotensive, BiPAP was replaced, EPAP of 10 with persistent sats in the high 80s.   No change in respiratory support for the time being, initiate norepinephrine  infusion  0601 -patient now has refractory hypercapnia and acidemia despite BiPAP therapy.  She is tolerating the BiPAP well, is somnolent, and has loved ones at her side.  We talked about progressing to comfort measures, but would like to wait for an additional family member to arrive at roughly 8-9 AM this morning.  We will continue our current care, BiPAP was adjusted in the meantime.  Family is amenable to transitioning to comfort measures once everyone has a chance to say their goodbyes.  [id number] -had a discussion with both Tim and Renee, ultimately they feel that her mom made it very clear that she would want to pursue comfort measures in a situation like this-they do not believe that prolonging the decision would benefit her.  They would like to transition to comfort measures at this time even if Heidi Stark is unable to make it to her bedside prior to [place] passing.  We will proceed with removal of BiPAP, withdrawal of supportive care, and focus on comfort measures only.  Orders were updated to reflect this.  Intervention Category Intermediate Interventions: Respiratory distress - evaluation and management  Heidi Stark 2024/06/10, 12:29 AM

## 2024-06-16 NOTE — Progress Notes (Signed)
 RN called because pt SPO2 was trending down into the 80s while on CPAP (peep of 8 w/ 6L of oxygen). Pts CPAP settings were increased to 10 of peep with no improvement in her oxygen level. Pt was taken off CPAP, deep suctioned, and placed onto a non-rebreather with SPO2 still trending down. Pt was then bagged with improvement in her SPO2 levels (90s). Per MD.Paliwal verbal order, the following was done for the pt: NT suctioned, an NPA was placed in her R nostral, and pt was placed on BIPAP 18/10 w/ 15L oxygen bleeding through. Pts SPO2 is now 94%

## 2024-06-16 DEATH — deceased

## 2024-06-18 ENCOUNTER — Ambulatory Visit

## 2024-06-20 ENCOUNTER — Ambulatory Visit: Payer: Medicare Other

## 2024-07-01 ENCOUNTER — Ambulatory Visit

## 2024-07-21 ENCOUNTER — Ambulatory Visit: Admitting: Gastroenterology

## 2024-09-05 ENCOUNTER — Ambulatory Visit: Admitting: Pulmonary Disease

## 2024-09-30 ENCOUNTER — Ambulatory Visit

## 2024-12-30 ENCOUNTER — Ambulatory Visit

## 2025-03-31 ENCOUNTER — Ambulatory Visit
